# Patient Record
Sex: Female | Born: 1955
Health system: Southern US, Community
[De-identification: ages and names within clinical notes are randomized; demographics above are authoritative.]

## PROBLEM LIST (undated history)

## (undated) DIAGNOSIS — I10 Essential (primary) hypertension: Secondary | ICD-10-CM

## (undated) DIAGNOSIS — I639 Cerebral infarction, unspecified: Secondary | ICD-10-CM

## (undated) DIAGNOSIS — I729 Aneurysm of unspecified site: Secondary | ICD-10-CM

## (undated) DIAGNOSIS — S065X9A Traumatic subdural hemorrhage with loss of consciousness of unspecified duration, initial encounter: Secondary | ICD-10-CM

## (undated) DIAGNOSIS — R569 Unspecified convulsions: Secondary | ICD-10-CM

## (undated) DIAGNOSIS — S065XAA Traumatic subdural hemorrhage with loss of consciousness status unknown, initial encounter: Secondary | ICD-10-CM

## (undated) DIAGNOSIS — Z931 Gastrostomy status: Secondary | ICD-10-CM

## (undated) DIAGNOSIS — Z930 Tracheostomy status: Secondary | ICD-10-CM

## (undated) DIAGNOSIS — J9600 Acute respiratory failure, unspecified whether with hypoxia or hypercapnia: Secondary | ICD-10-CM

## (undated) DIAGNOSIS — Z970 Presence of artificial eye: Secondary | ICD-10-CM

## (undated) HISTORY — DX: Presence of artificial eye: Z97.0

## (undated) MED FILL — Fulvestrant Inj Soln Pref Syr 250 MG/5ML: INTRAMUSCULAR | Qty: 10 | Status: AC

---

## 2020-01-30 ENCOUNTER — Other Ambulatory Visit: Payer: Self-pay

## 2020-01-30 ENCOUNTER — Inpatient Hospital Stay (HOSPITAL_COMMUNITY): Payer: Self-pay | Admitting: Anesthesiology

## 2020-01-30 ENCOUNTER — Emergency Department (HOSPITAL_COMMUNITY): Payer: Self-pay

## 2020-01-30 ENCOUNTER — Inpatient Hospital Stay (HOSPITAL_COMMUNITY)
Admission: EM | Admit: 2020-01-30 | Discharge: 2020-04-22 | DRG: 003 | Disposition: A | Discharge status: Rehabilitation Facility | Payer: Self-pay | Attending: Neurosurgery | Admitting: Neurosurgery

## 2020-01-30 ENCOUNTER — Inpatient Hospital Stay (HOSPITAL_COMMUNITY)
Admission: EM | Disposition: A | Discharge status: Rehabilitation Facility | Payer: Self-pay | Source: Home / Self Care | Attending: Neurosurgery

## 2020-01-30 ENCOUNTER — Inpatient Hospital Stay (HOSPITAL_COMMUNITY): Payer: Self-pay

## 2020-01-30 ENCOUNTER — Encounter (HOSPITAL_COMMUNITY)
Admission: EM | Disposition: A | Discharge status: Rehabilitation Facility | Payer: Self-pay | Source: Home / Self Care | Attending: Neurosurgery

## 2020-01-30 ENCOUNTER — Other Ambulatory Visit (HOSPITAL_COMMUNITY): Payer: Self-pay

## 2020-01-30 ENCOUNTER — Encounter (HOSPITAL_COMMUNITY): Payer: Self-pay | Admitting: Emergency Medicine

## 2020-01-30 DIAGNOSIS — N390 Urinary tract infection, site not specified: Secondary | ICD-10-CM | POA: Diagnosis not present

## 2020-01-30 DIAGNOSIS — R061 Stridor: Secondary | ICD-10-CM

## 2020-01-30 DIAGNOSIS — G8194 Hemiplegia, unspecified affecting left nondominant side: Secondary | ICD-10-CM | POA: Diagnosis present

## 2020-01-30 DIAGNOSIS — Z23 Encounter for immunization: Secondary | ICD-10-CM

## 2020-01-30 DIAGNOSIS — I959 Hypotension, unspecified: Secondary | ICD-10-CM | POA: Diagnosis not present

## 2020-01-30 DIAGNOSIS — I609 Nontraumatic subarachnoid hemorrhage, unspecified: Secondary | ICD-10-CM

## 2020-01-30 DIAGNOSIS — G529 Cranial nerve disorder, unspecified: Secondary | ICD-10-CM | POA: Diagnosis not present

## 2020-01-30 DIAGNOSIS — R001 Bradycardia, unspecified: Secondary | ICD-10-CM | POA: Diagnosis not present

## 2020-01-30 DIAGNOSIS — E1165 Type 2 diabetes mellitus with hyperglycemia: Secondary | ICD-10-CM | POA: Diagnosis not present

## 2020-01-30 DIAGNOSIS — G4700 Insomnia, unspecified: Secondary | ICD-10-CM | POA: Diagnosis not present

## 2020-01-30 DIAGNOSIS — E870 Hyperosmolality and hypernatremia: Secondary | ICD-10-CM | POA: Diagnosis not present

## 2020-01-30 DIAGNOSIS — R7303 Prediabetes: Secondary | ICD-10-CM | POA: Diagnosis present

## 2020-01-30 DIAGNOSIS — I10 Essential (primary) hypertension: Secondary | ICD-10-CM | POA: Diagnosis present

## 2020-01-30 DIAGNOSIS — Z6823 Body mass index (BMI) 23.0-23.9, adult: Secondary | ICD-10-CM

## 2020-01-30 DIAGNOSIS — J398 Other specified diseases of upper respiratory tract: Secondary | ICD-10-CM | POA: Diagnosis present

## 2020-01-30 DIAGNOSIS — E878 Other disorders of electrolyte and fluid balance, not elsewhere classified: Secondary | ICD-10-CM | POA: Diagnosis not present

## 2020-01-30 DIAGNOSIS — R1310 Dysphagia, unspecified: Secondary | ICD-10-CM | POA: Diagnosis present

## 2020-01-30 DIAGNOSIS — K567 Ileus, unspecified: Secondary | ICD-10-CM

## 2020-01-30 DIAGNOSIS — R0682 Tachypnea, not elsewhere classified: Secondary | ICD-10-CM

## 2020-01-30 DIAGNOSIS — G96198 Other disorders of meninges, not elsewhere classified: Secondary | ICD-10-CM | POA: Diagnosis not present

## 2020-01-30 DIAGNOSIS — L8990 Pressure ulcer of unspecified site, unspecified stage: Secondary | ICD-10-CM | POA: Diagnosis not present

## 2020-01-30 DIAGNOSIS — R569 Unspecified convulsions: Secondary | ICD-10-CM

## 2020-01-30 DIAGNOSIS — R222 Localized swelling, mass and lump, trunk: Secondary | ICD-10-CM | POA: Diagnosis present

## 2020-01-30 DIAGNOSIS — Z930 Tracheostomy status: Secondary | ICD-10-CM

## 2020-01-30 DIAGNOSIS — J9690 Respiratory failure, unspecified, unspecified whether with hypoxia or hypercapnia: Secondary | ICD-10-CM

## 2020-01-30 DIAGNOSIS — E079 Disorder of thyroid, unspecified: Secondary | ICD-10-CM

## 2020-01-30 DIAGNOSIS — I69891 Dysphagia following other cerebrovascular disease: Secondary | ICD-10-CM

## 2020-01-30 DIAGNOSIS — E049 Nontoxic goiter, unspecified: Secondary | ICD-10-CM

## 2020-01-30 DIAGNOSIS — J9811 Atelectasis: Secondary | ICD-10-CM | POA: Diagnosis not present

## 2020-01-30 DIAGNOSIS — I729 Aneurysm of unspecified site: Secondary | ICD-10-CM

## 2020-01-30 DIAGNOSIS — Z4659 Encounter for fitting and adjustment of other gastrointestinal appliance and device: Secondary | ICD-10-CM

## 2020-01-30 DIAGNOSIS — J9601 Acute respiratory failure with hypoxia: Secondary | ICD-10-CM | POA: Diagnosis present

## 2020-01-30 DIAGNOSIS — E8809 Other disorders of plasma-protein metabolism, not elsewhere classified: Secondary | ICD-10-CM | POA: Diagnosis not present

## 2020-01-30 DIAGNOSIS — Z20822 Contact with and (suspected) exposure to covid-19: Secondary | ICD-10-CM | POA: Diagnosis present

## 2020-01-30 DIAGNOSIS — D638 Anemia in other chronic diseases classified elsewhere: Secondary | ICD-10-CM | POA: Diagnosis present

## 2020-01-30 DIAGNOSIS — G629 Polyneuropathy, unspecified: Secondary | ICD-10-CM | POA: Diagnosis not present

## 2020-01-30 DIAGNOSIS — I607 Nontraumatic subarachnoid hemorrhage from unspecified intracranial artery: Secondary | ICD-10-CM | POA: Diagnosis present

## 2020-01-30 DIAGNOSIS — R54 Age-related physical debility: Secondary | ICD-10-CM | POA: Diagnosis present

## 2020-01-30 DIAGNOSIS — K5900 Constipation, unspecified: Secondary | ICD-10-CM | POA: Diagnosis not present

## 2020-01-30 DIAGNOSIS — Z9911 Dependence on respirator [ventilator] status: Secondary | ICD-10-CM

## 2020-01-30 DIAGNOSIS — R739 Hyperglycemia, unspecified: Secondary | ICD-10-CM

## 2020-01-30 DIAGNOSIS — I161 Hypertensive emergency: Secondary | ICD-10-CM | POA: Diagnosis present

## 2020-01-30 DIAGNOSIS — E876 Hypokalemia: Secondary | ICD-10-CM | POA: Diagnosis present

## 2020-01-30 DIAGNOSIS — R140 Abdominal distension (gaseous): Secondary | ICD-10-CM

## 2020-01-30 DIAGNOSIS — R0600 Dyspnea, unspecified: Secondary | ICD-10-CM

## 2020-01-30 DIAGNOSIS — G911 Obstructive hydrocephalus: Secondary | ICD-10-CM

## 2020-01-30 DIAGNOSIS — J9600 Acute respiratory failure, unspecified whether with hypoxia or hypercapnia: Secondary | ICD-10-CM

## 2020-01-30 DIAGNOSIS — I615 Nontraumatic intracerebral hemorrhage, intraventricular: Secondary | ICD-10-CM | POA: Diagnosis present

## 2020-01-30 DIAGNOSIS — D72829 Elevated white blood cell count, unspecified: Secondary | ICD-10-CM

## 2020-01-30 DIAGNOSIS — Z9089 Acquired absence of other organs: Secondary | ICD-10-CM | POA: Diagnosis not present

## 2020-01-30 DIAGNOSIS — I6052 Nontraumatic subarachnoid hemorrhage from left vertebral artery: Principal | ICD-10-CM | POA: Diagnosis present

## 2020-01-30 DIAGNOSIS — G9349 Other encephalopathy: Secondary | ICD-10-CM | POA: Diagnosis present

## 2020-01-30 DIAGNOSIS — Z751 Person awaiting admission to adequate facility elsewhere: Secondary | ICD-10-CM

## 2020-01-30 DIAGNOSIS — Z931 Gastrostomy status: Secondary | ICD-10-CM

## 2020-01-30 DIAGNOSIS — G9341 Metabolic encephalopathy: Secondary | ICD-10-CM | POA: Diagnosis present

## 2020-01-30 DIAGNOSIS — Z781 Physical restraint status: Secondary | ICD-10-CM

## 2020-01-30 DIAGNOSIS — L89892 Pressure ulcer of other site, stage 2: Secondary | ICD-10-CM | POA: Diagnosis not present

## 2020-01-30 DIAGNOSIS — J9501 Hemorrhage from tracheostomy stoma: Secondary | ICD-10-CM | POA: Diagnosis not present

## 2020-01-30 DIAGNOSIS — D509 Iron deficiency anemia, unspecified: Secondary | ICD-10-CM | POA: Diagnosis not present

## 2020-01-30 DIAGNOSIS — I6203 Nontraumatic chronic subdural hemorrhage: Secondary | ICD-10-CM | POA: Diagnosis present

## 2020-01-30 DIAGNOSIS — D181 Lymphangioma, any site: Secondary | ICD-10-CM | POA: Diagnosis present

## 2020-01-30 DIAGNOSIS — Z978 Presence of other specified devices: Secondary | ICD-10-CM

## 2020-01-30 DIAGNOSIS — G40909 Epilepsy, unspecified, not intractable, without status epilepticus: Secondary | ICD-10-CM | POA: Diagnosis not present

## 2020-01-30 DIAGNOSIS — G910 Communicating hydrocephalus: Secondary | ICD-10-CM | POA: Diagnosis not present

## 2020-01-30 DIAGNOSIS — E860 Dehydration: Secondary | ICD-10-CM | POA: Diagnosis present

## 2020-01-30 DIAGNOSIS — Z789 Other specified health status: Secondary | ICD-10-CM

## 2020-01-30 DIAGNOSIS — E890 Postprocedural hypothyroidism: Secondary | ICD-10-CM | POA: Diagnosis not present

## 2020-01-30 DIAGNOSIS — M549 Dorsalgia, unspecified: Secondary | ICD-10-CM | POA: Diagnosis not present

## 2020-01-30 DIAGNOSIS — E042 Nontoxic multinodular goiter: Secondary | ICD-10-CM | POA: Diagnosis present

## 2020-01-30 DIAGNOSIS — T17908A Unspecified foreign body in respiratory tract, part unspecified causing other injury, initial encounter: Secondary | ICD-10-CM

## 2020-01-30 DIAGNOSIS — D6489 Other specified anemias: Secondary | ICD-10-CM | POA: Diagnosis not present

## 2020-01-30 DIAGNOSIS — B9561 Methicillin susceptible Staphylococcus aureus infection as the cause of diseases classified elsewhere: Secondary | ICD-10-CM | POA: Diagnosis not present

## 2020-01-30 DIAGNOSIS — E440 Moderate protein-calorie malnutrition: Secondary | ICD-10-CM | POA: Diagnosis not present

## 2020-01-30 DIAGNOSIS — J209 Acute bronchitis, unspecified: Secondary | ICD-10-CM | POA: Diagnosis not present

## 2020-01-30 HISTORY — PX: IR ANGIO INTRA EXTRACRAN SEL INTERNAL CAROTID BILAT MOD SED: IMG5363

## 2020-01-30 HISTORY — PX: RADIOLOGY WITH ANESTHESIA: SHX6223

## 2020-01-30 HISTORY — PX: CRANIOTOMY: SHX93

## 2020-01-30 HISTORY — PX: IR ANGIO VERTEBRAL SEL VERTEBRAL UNI L MOD SED: IMG5367

## 2020-01-30 LAB — CBC WITH DIFFERENTIAL/PLATELET
Abs Immature Granulocytes: 0.04 10*3/uL (ref 0.00–0.07)
Basophils Absolute: 0 10*3/uL (ref 0.0–0.1)
Basophils Relative: 0 %
Eosinophils Absolute: 0.1 10*3/uL (ref 0.0–0.5)
Eosinophils Relative: 1 %
HCT: 43.8 % (ref 36.0–46.0)
Hemoglobin: 13.5 g/dL (ref 12.0–15.0)
Immature Granulocytes: 0 %
Lymphocytes Relative: 19 %
Lymphs Abs: 1.9 10*3/uL (ref 0.7–4.0)
MCH: 25.4 pg — ABNORMAL LOW (ref 26.0–34.0)
MCHC: 30.8 g/dL (ref 30.0–36.0)
MCV: 82.5 fL (ref 80.0–100.0)
Monocytes Absolute: 0.4 10*3/uL (ref 0.1–1.0)
Monocytes Relative: 4 %
Neutro Abs: 7.9 10*3/uL — ABNORMAL HIGH (ref 1.7–7.7)
Neutrophils Relative %: 76 %
Platelets: 168 10*3/uL (ref 150–400)
RBC: 5.31 MIL/uL — ABNORMAL HIGH (ref 3.87–5.11)
RDW: 14.7 % (ref 11.5–15.5)
WBC: 10.3 10*3/uL (ref 4.0–10.5)
nRBC: 0 % (ref 0.0–0.2)

## 2020-01-30 LAB — RAPID URINE DRUG SCREEN, HOSP PERFORMED
Amphetamines: NOT DETECTED
Barbiturates: NOT DETECTED
Benzodiazepines: NOT DETECTED
Cocaine: NOT DETECTED
Opiates: NOT DETECTED
Tetrahydrocannabinol: NOT DETECTED

## 2020-01-30 LAB — I-STAT ARTERIAL BLOOD GAS, ED
Acid-Base Excess: 1 mmol/L (ref 0.0–2.0)
Bicarbonate: 25.8 mmol/L (ref 20.0–28.0)
Calcium, Ion: 1.24 mmol/L (ref 1.15–1.40)
HCT: 43 % (ref 36.0–46.0)
Hemoglobin: 14.6 g/dL (ref 12.0–15.0)
O2 Saturation: 99 %
Potassium: 3.8 mmol/L (ref 3.5–5.1)
Sodium: 141 mmol/L (ref 135–145)
TCO2: 27 mmol/L (ref 22–32)
pCO2 arterial: 38.7 mmHg (ref 32.0–48.0)
pH, Arterial: 7.431 (ref 7.350–7.450)
pO2, Arterial: 128 mmHg — ABNORMAL HIGH (ref 83.0–108.0)

## 2020-01-30 LAB — BASIC METABOLIC PANEL
Anion gap: 10 (ref 5–15)
BUN: 20 mg/dL (ref 8–23)
CO2: 27 mmol/L (ref 22–32)
Calcium: 9.6 mg/dL (ref 8.9–10.3)
Chloride: 104 mmol/L (ref 98–111)
Creatinine, Ser: 0.84 mg/dL (ref 0.44–1.00)
GFR, Estimated: >60 mL/min (ref >60)
Glucose, Bld: 178 mg/dL — ABNORMAL HIGH (ref 70–99)
Potassium: 3.4 mmol/L — ABNORMAL LOW (ref 3.5–5.1)
Sodium: 141 mmol/L (ref 135–145)

## 2020-01-30 LAB — ETHANOL: Alcohol, Ethyl (B): <10 mg/dL (ref <10)

## 2020-01-30 LAB — RESPIRATORY PANEL BY RT PCR (FLU A&B, COVID)
Influenza A by PCR: NEGATIVE
Influenza B by PCR: NEGATIVE
SARS Coronavirus 2 by RT PCR: NEGATIVE

## 2020-01-30 LAB — GLUCOSE, CAPILLARY
Glucose-Capillary: 140 mg/dL — ABNORMAL HIGH (ref 70–99)
Glucose-Capillary: 109 mg/dL — ABNORMAL HIGH (ref 70–99)

## 2020-01-30 LAB — PROTIME-INR
INR: 1 (ref 0.8–1.2)
Prothrombin Time: 12.7 seconds (ref 11.4–15.2)

## 2020-01-30 LAB — HIV ANTIBODY (ROUTINE TESTING W REFLEX): HIV Screen 4th Generation wRfx: NONREACTIVE

## 2020-01-30 LAB — HEMOGLOBIN A1C
Hgb A1c MFr Bld: 6.2 % — ABNORMAL HIGH (ref 4.8–5.6)
Mean Plasma Glucose: 131.24 mg/dL

## 2020-01-30 LAB — MRSA PCR SCREENING: MRSA by PCR: NEGATIVE

## 2020-01-30 LAB — APTT: aPTT: 27 seconds (ref 24–36)

## 2020-01-30 LAB — ABO/RH: ABO/RH(D): A POS

## 2020-01-30 LAB — PREPARE RBC (CROSSMATCH)

## 2020-01-30 IMAGING — CT CT HEAD W/O CM
4 series · 16 of 47 positions shown, 18 images · non-contrast
Comparison: None.

CLINICAL DATA: New or worsening headache, sudden onset

EXAM:
CT HEAD WITHOUT CONTRAST
TECHNIQUE: Contiguous axial images were obtained from the base of the skull
through the vertex without intravenous contrast.

[Series 3: head without · axial · non-contrast · 0.46mm/px · z∈[-162,-27]mm · 7 of 37 slices shown, 9 images]
[im 5/37  brain]
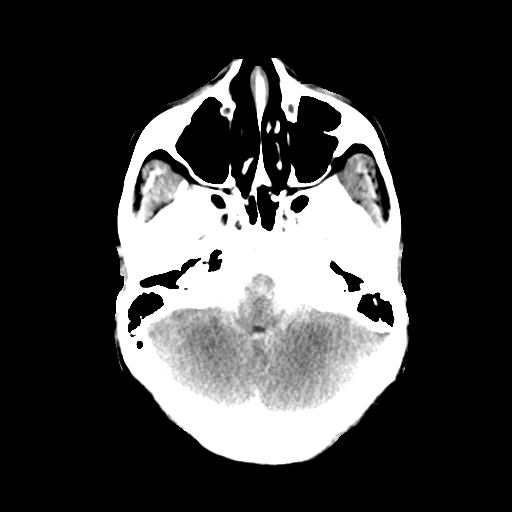
[im 5/37  bone]
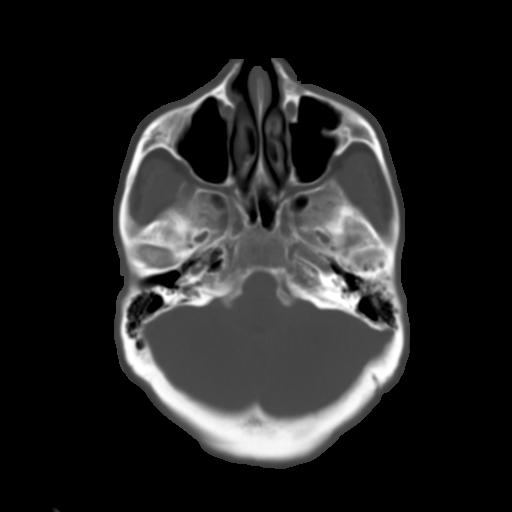
[im 10/37  brain]
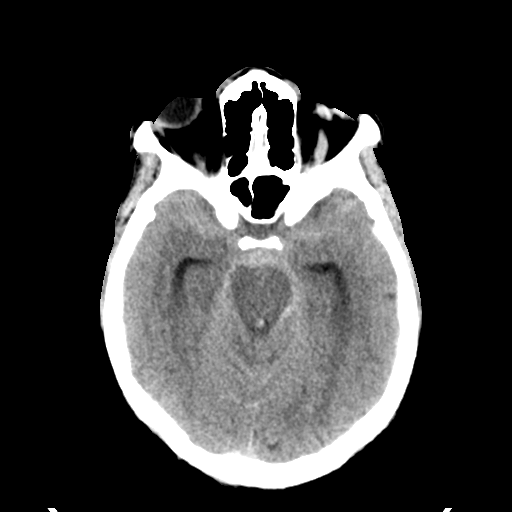
[im 14/37  brain]
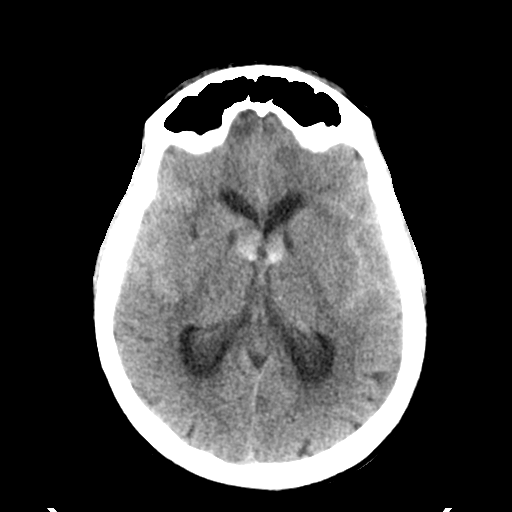
[im 19/37  brain]
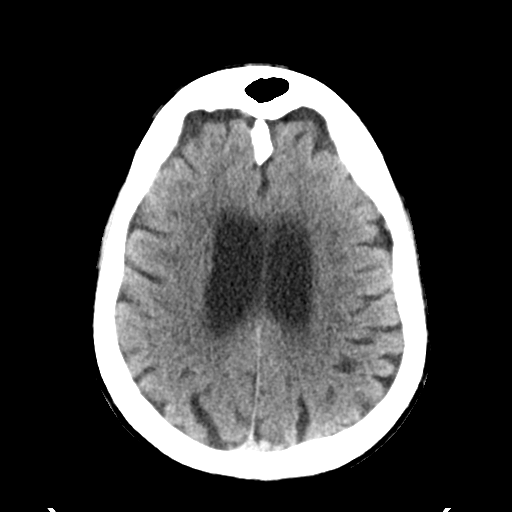
[im 23/37  brain]
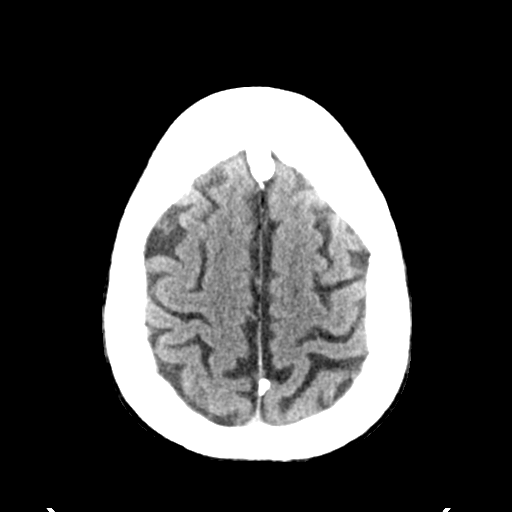
[im 23/37  bone]
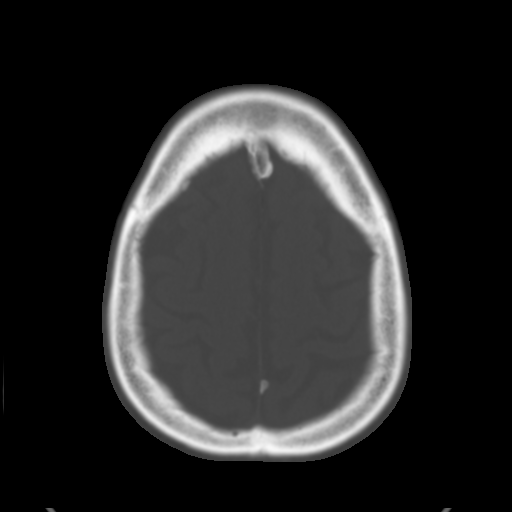
[im 28/37  brain]
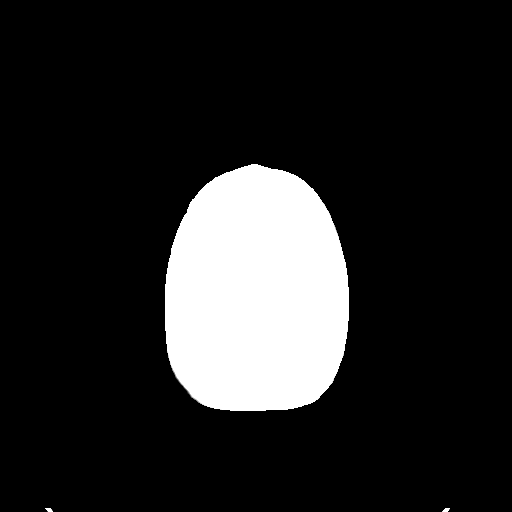
[im 32/37  brain]
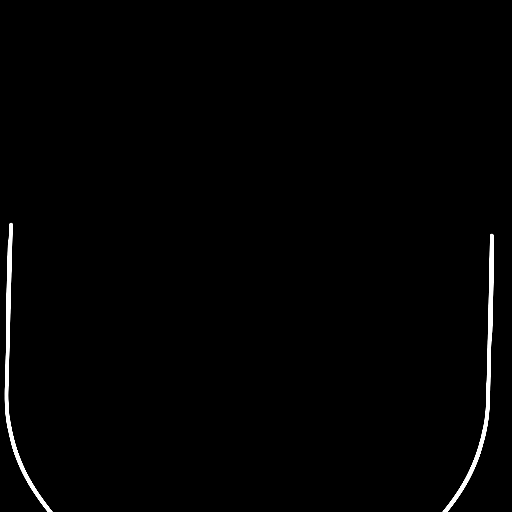

[Series 4: head bone · axial · 0.46mm/px · z∈[-164,-128]mm · 3 of 91 slices shown]
[im 10/91  bone]
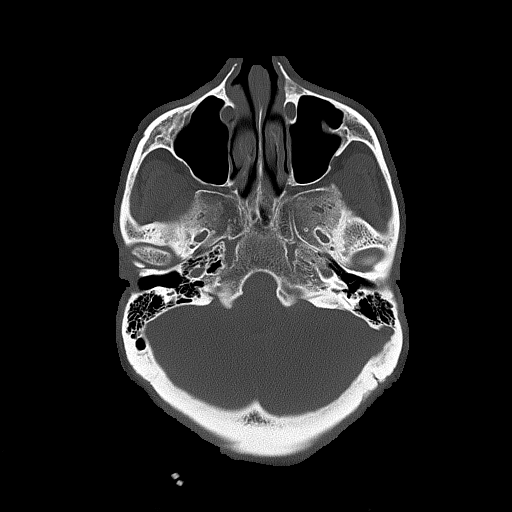
[im 19/91  bone]
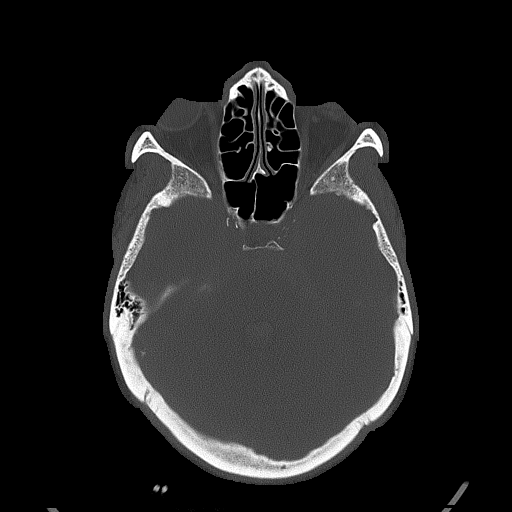
[im 28/91  bone]
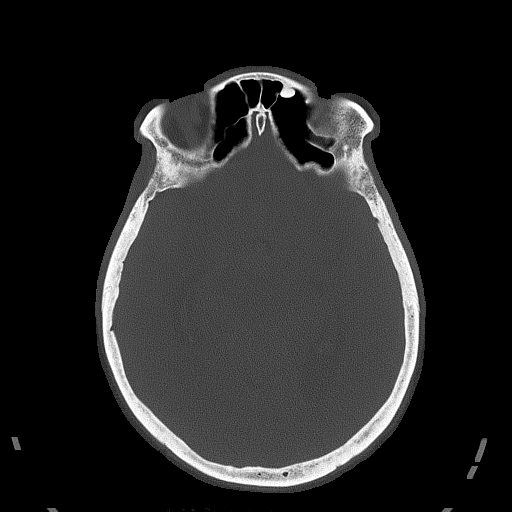

[Series 5: head without cor · coronal · non-contrast · 0.31mm/px · 3 of 71 slices shown]
[im 24/71  brain]
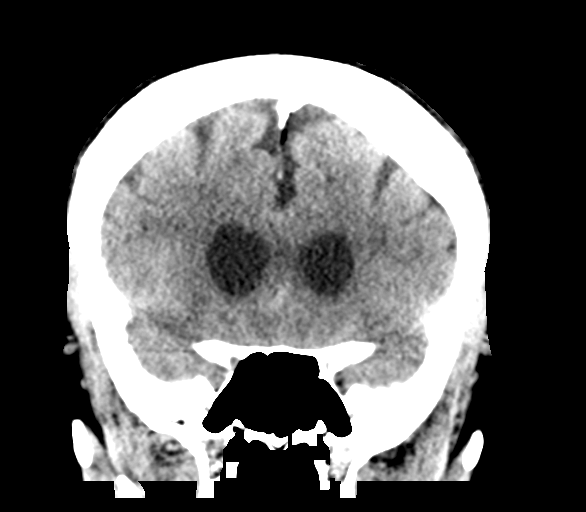
[im 32/71  brain]
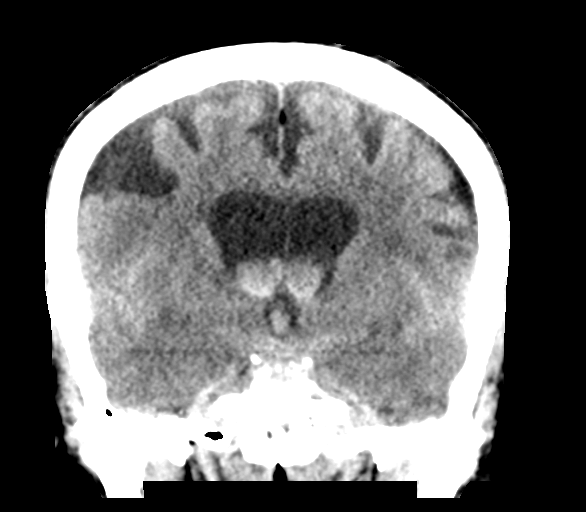
[im 39/71  brain]
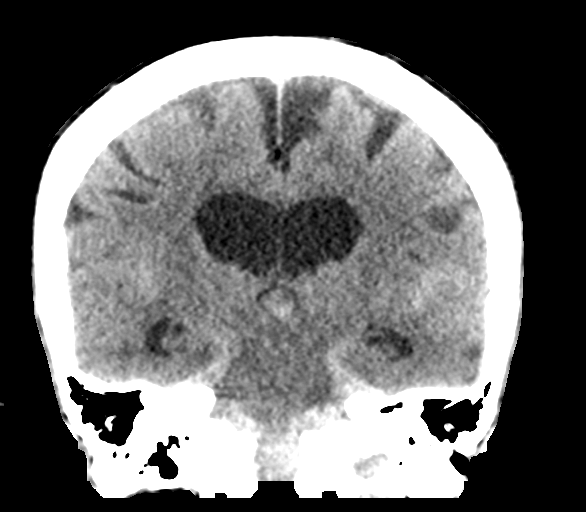

[Series 6: head without sag · sagittal · non-contrast · 0.31mm/px · 3 of 62 slices shown]
[im 21/62  brain]
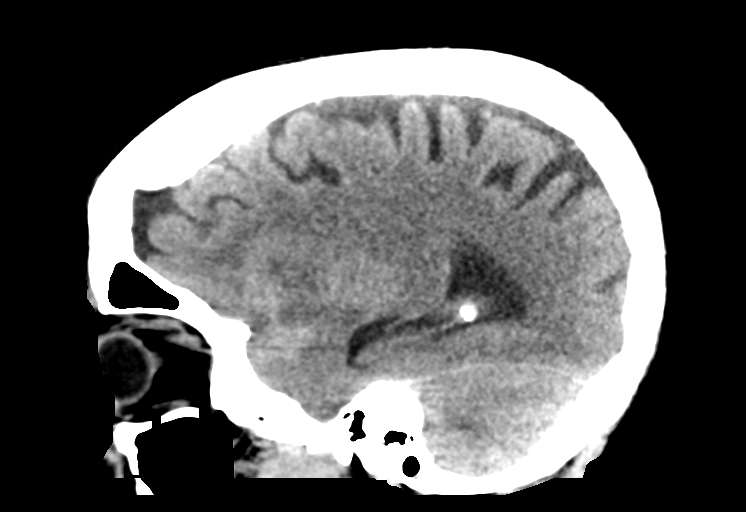
[im 31/62  brain]
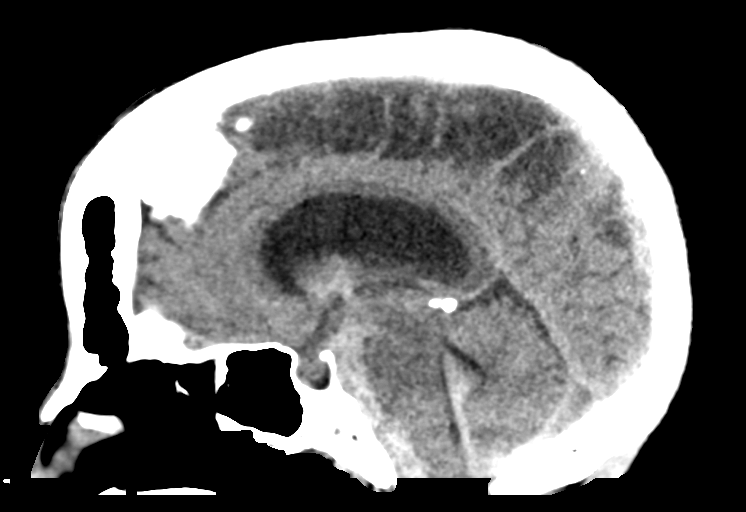
[im 41/62  brain]
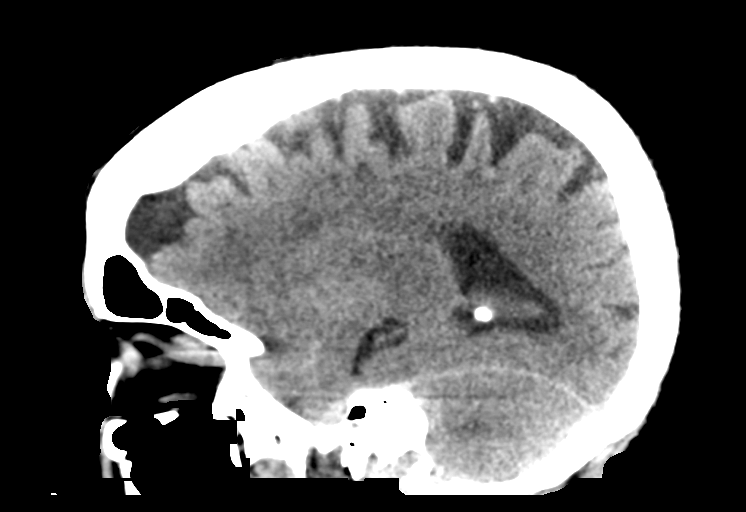

[16 of 47 positions shown; findings below may reference images not displayed]

FINDINGS: Brain: Extensive subarachnoid hemorrhage greatest in the posterior
fossa where there is encircling of the brainstem. Cranial extension
along both sylvian fissures. There is intraventricular reflux
reaching the lateral ventricles, with ventriculomegaly that is
generalized.

Low-density in the cerebral white matter including chronic
perforator infarct at the right basal ganglia. No visible cortex
infarct. There is brain atrophy.

Vascular: Negative

Skull: Negative

Sinuses/Orbits: Left enucleation with prosthesis.

Other: Critical Value/emergent results were called by telephone at
the time of interpretation on [DATE] at [DATE] to provider JHOEL
JHOEL , who verbally acknowledged these results.
IMPRESSION: 1. Large volume of subarachnoid hemorrhage related to a ruptured
left PICA aneurysm on the contemporaneous CTA.
2. Intraventricular reflux with hydrocephalus.
3. Chronic small vessel disease.

## 2020-01-30 IMAGING — DX DG CHEST 1V PORT
1 series · 1 of 1 positions shown · non-contrast
Comparison: None

CLINICAL DATA: Intubation, history of subarachnoid hemorrhage

EXAM:
PORTABLE CHEST 1 VIEW

[chest]
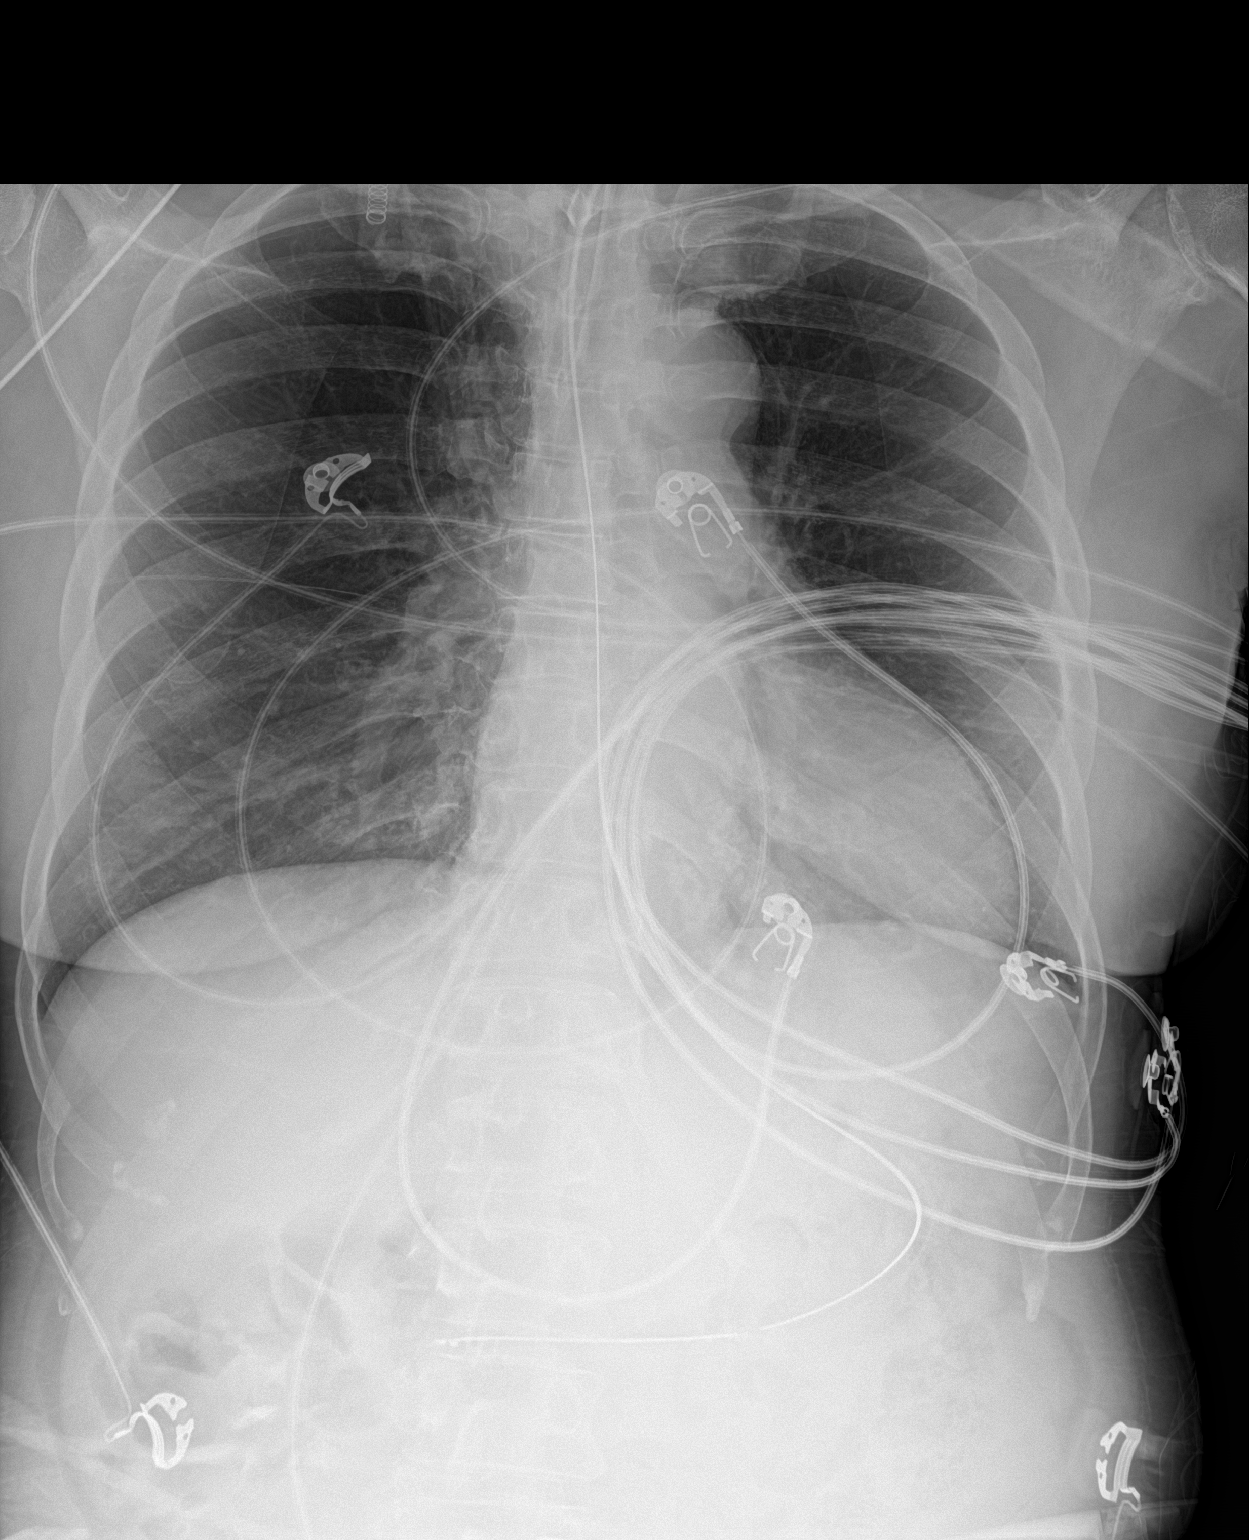

[1 of 1 positions shown; findings below may reference images not displayed]

FINDINGS: Endotracheal tube approximately 2 cm above the carina though the
carina is difficult to visualize definitively. Gastric tube in the
distal stomach.

Cardiomediastinal contours and hilar structures are normal.

Lungs are clear.

On limited assessment no acute skeletal process.
IMPRESSION: 1. Endotracheal tube approximately 2 cm above the carina though the
carina is difficult to definitively visualize. Tip projects over the
T4 vertebral body. Gastric tube in the distal stomach.
2. No acute cardiopulmonary disease.

## 2020-01-30 IMAGING — CT CT ANGIO HEAD
2 of 7 series · 8 of 33 positions shown · IV contrast (OMNI 350)
Comparison: Contemporaneous noncontrast head CT

CLINICAL DATA: Sudden onset headache.



[Series 6: cta neck · axial · 0.46mm/px · z∈[-227,-115]mm · 2 of 168 slices shown]
[im 56/168  soft-tissue]
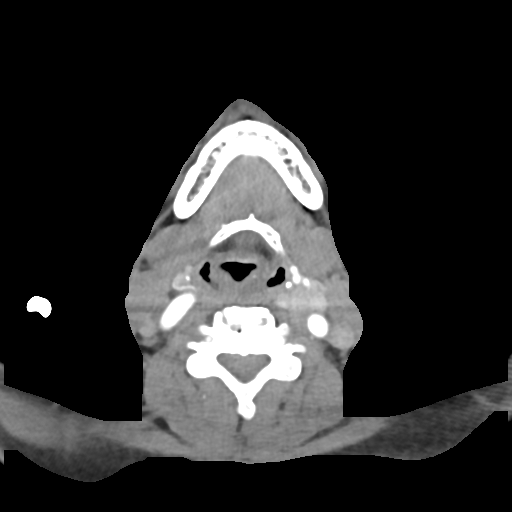
[im 112/168  soft-tissue]
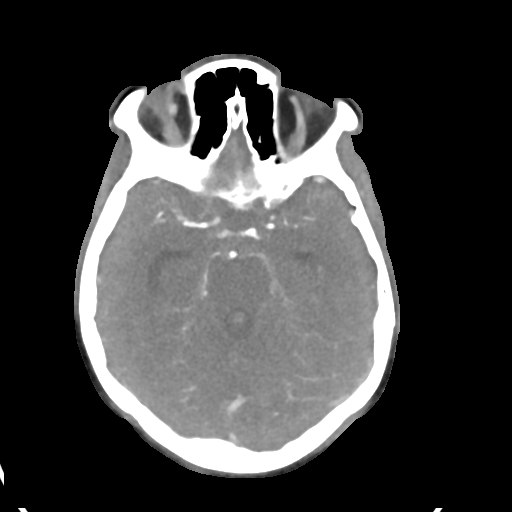

[Series 8: cta neck axial · axial · 0.39mm/px · z∈[-309,-78]mm · 6 of 329 slices shown]
[im 47/329  soft-tissue]
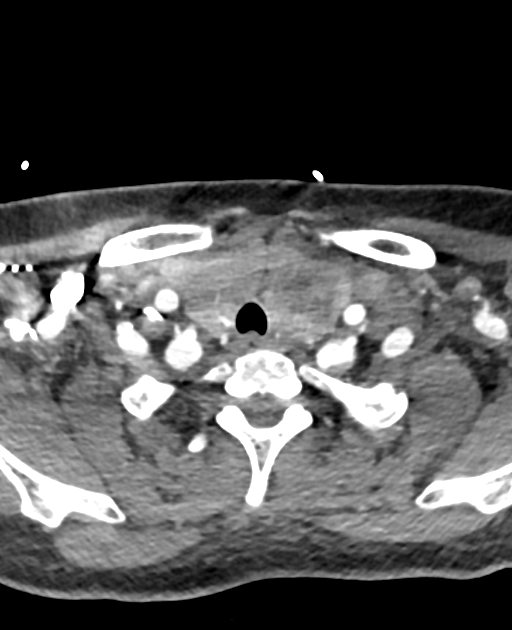
[im 94/329  bone]
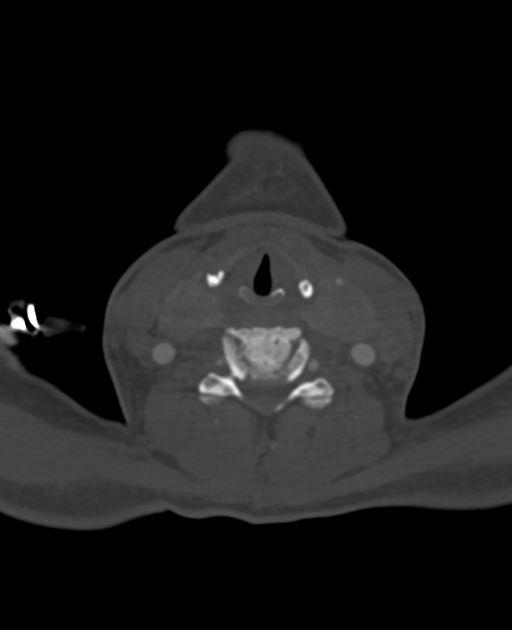
[im 141/329  soft-tissue]
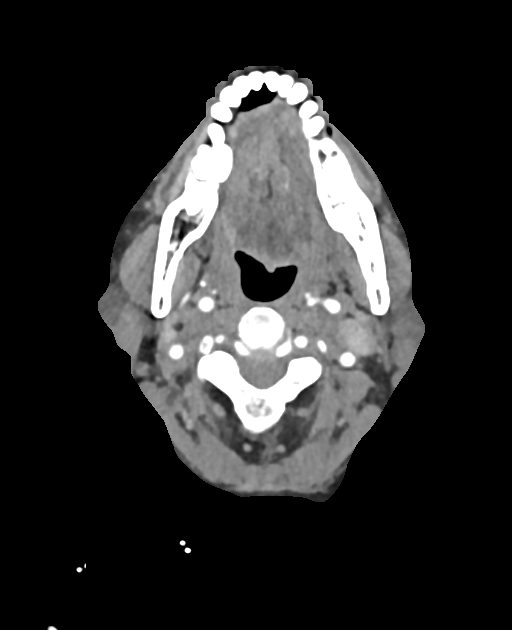
[im 188/329  bone]
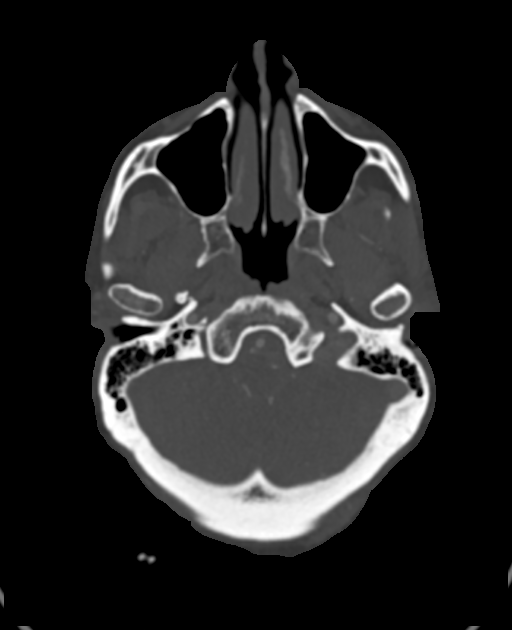
[im 235/329  soft-tissue]
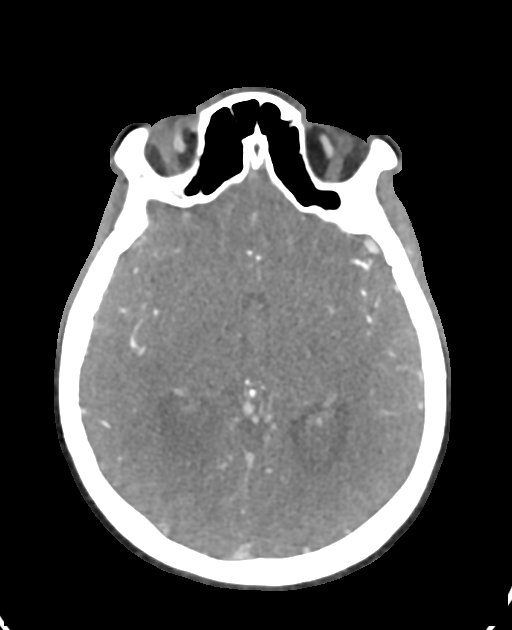
[im 282/329  bone]
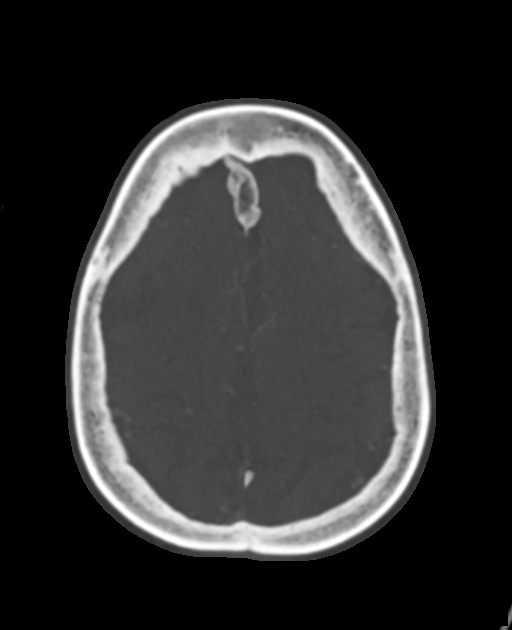

[8 of 33 positions shown; findings below may reference images not displayed]

FINDINGS: CTA NECK FINDINGS

Aortic arch: No acute finding.  Three vessel branching.

Right carotid system: Vessels are smooth and widely patent.

Left carotid system: Vessels are smooth and widely patent.

Vertebral arteries: Left vertebral artery dominance. No proximal
subclavian stenosis. The vertebral arteries are smooth and widely
patent.

Skeleton: Advanced cervical spine degeneration. Dental caries with
molar periapical erosions.

Other neck: Goiter.

Upper chest: Masslike appearance left of the upper thoracic
esophagus which could be intramural or extra serosal, 2.3 cm in
diameter on axial slices and 2.8 cm craniocaudal on coronal
reformats.

Review of the MIP images confirms the above findings

CTA HEAD FINDINGS

Anterior circulation: Atheromatous plaque along both carotid siphons
without flow reducing stenosis. No branch occlusion, generalized
beading, or aneurysm seen.

Posterior circulation: Left dominant vertebral artery. Projecting
posteriorly towards the medulla is a left PICA aneurysm which has a
mildly irregular sac shape and measures up to 3 mm base to dome.
Subjective moderate narrowing of the right V4 segment just before
the PICA origin. The basilar is diffusely patent. No PCA branch
occlusion or aneurysm.

Venous sinuses: Negative for the arterial phase. The superior
ophthalmic veins show prominent size and density, suggesting
increase in intracranial pressure.

Anatomic variants: As above

Review of the MIP images confirms the above findings
IMPRESSION: 1. Ruptured 3 mm left PICA aneurysm.
2. The left vertebral artery is dominant and diffusely patent.
Moderate right V4 segment stenosis.
3. Incidental partially covered mass in the middle mediastinum which
mildly deforms the esophagus and could be intra or extramural. After
recovery, recommend chest CT with contrast.
4. No stenosis or significant atherosclerosis in the neck.
5. Carotid siphon atherosclerosis without flow reducing stenosis.

## 2020-01-30 SURGERY — CRANIOTOMY, FOR VERTEBRAL/BASILAR ARTERY ANEURYSM REPAIR
Anesthesia: General | Laterality: Left

## 2020-01-30 SURGERY — IR WITH ANESTHESIA
Anesthesia: General

## 2020-01-30 MED ORDER — IOHEXOL 300 MG/ML  SOLN
150.0000 mL | Freq: Once | INTRAMUSCULAR | Status: AC | PRN
  Administered 2020-01-30: 60 mL via INTRA_ARTERIAL

## 2020-01-30 MED ORDER — ACETAMINOPHEN 160 MG/5ML PO SOLN
650.0000 mg | ORAL | Status: DC | PRN
  Administered 2020-01-31 – 2020-04-21 (×45): 650 mg
  Filled 2020-01-30 (×49): qty 20.3

## 2020-01-30 MED ORDER — ACETAMINOPHEN 650 MG RE SUPP
650.0000 mg | RECTAL | Status: DC | PRN
  Administered 2020-02-21: 650 mg via RECTAL
  Filled 2020-01-30: qty 1

## 2020-01-30 MED ORDER — ORAL CARE MOUTH RINSE
15.0000 mL | OROMUCOSAL | Status: DC
  Administered 2020-01-30 – 2020-03-01 (×281): 15 mL via OROMUCOSAL

## 2020-01-30 MED ORDER — POTASSIUM CHLORIDE 20 MEQ PO PACK
40.0000 meq | PACK | Freq: Once | ORAL | Status: AC
  Administered 2020-01-30: 40 meq
  Filled 2020-01-30: qty 2

## 2020-01-30 MED ORDER — SODIUM CHLORIDE 0.9% IV SOLUTION: Freq: Once | INTRAVENOUS | Status: DC

## 2020-01-30 MED ORDER — 0.9 % SODIUM CHLORIDE (POUR BTL) OPTIME
TOPICAL | Status: DC | PRN
  Administered 2020-01-30: 1000 mL

## 2020-01-30 MED ORDER — BUPIVACAINE HCL (PF) 0.5 % IJ SOLN
INTRAMUSCULAR | Status: AC
  Filled 2020-01-30: qty 30

## 2020-01-30 MED ORDER — ONDANSETRON HCL 4 MG/2ML IJ SOLN
INTRAMUSCULAR | Status: AC
  Administered 2020-01-30: 4 mg via INTRAVENOUS
  Filled 2020-01-30: qty 2

## 2020-01-30 MED ORDER — FENTANYL BOLUS VIA INFUSION
25.0000 ug | INTRAVENOUS | Status: DC | PRN
  Administered 2020-01-31: 25 ug via INTRAVENOUS
  Filled 2020-01-30: qty 25

## 2020-01-30 MED ORDER — LIDOCAINE HCL 1 % IJ SOLN
INTRAMUSCULAR | Status: AC
  Filled 2020-01-30: qty 20

## 2020-01-30 MED ORDER — INDOCYANINE GREEN 25 MG IV SOLR
25.0000 mg | Freq: Once | INTRAVENOUS | Status: DC
  Filled 2020-01-30: qty 10

## 2020-01-30 MED ORDER — THROMBIN 5000 UNITS EX SOLR
OROMUCOSAL | Status: DC | PRN
  Administered 2020-01-30 (×2): 5 mL via TOPICAL

## 2020-01-30 MED ORDER — ROCURONIUM BROMIDE 10 MG/ML (PF) SYRINGE
PREFILLED_SYRINGE | INTRAVENOUS | Status: DC | PRN
  Administered 2020-01-30 (×2): 20 mg via INTRAVENOUS
  Administered 2020-01-30: 30 mg via INTRAVENOUS
  Administered 2020-01-31: 20 mg via INTRAVENOUS

## 2020-01-30 MED ORDER — FENTANYL 2500MCG IN NS 250ML (10MCG/ML) PREMIX INFUSION
25.0000 ug/h | INTRAVENOUS | Status: DC
  Administered 2020-01-30: 25 ug/h via INTRAVENOUS
  Administered 2020-01-30: 50 ug/h via INTRAVENOUS
  Filled 2020-01-30: qty 250

## 2020-01-30 MED ORDER — MANNITOL 25 % IV SOLN
INTRAVENOUS | Status: DC | PRN
  Administered 2020-01-30: 50 g via INTRAVENOUS

## 2020-01-30 MED ORDER — PHENYLEPHRINE 40 MCG/ML (10ML) SYRINGE FOR IV PUSH (FOR BLOOD PRESSURE SUPPORT)
PREFILLED_SYRINGE | INTRAVENOUS | Status: DC | PRN
  Administered 2020-01-30: 200 ug via INTRAVENOUS
  Administered 2020-01-30: 80 ug via INTRAVENOUS
  Administered 2020-01-31: 40 ug via INTRAVENOUS

## 2020-01-30 MED ORDER — POTASSIUM CHLORIDE 20 MEQ PO PACK: 40.0000 meq | PACK | Freq: Once | ORAL | Status: DC

## 2020-01-30 MED ORDER — FENTANYL CITRATE (PF) 250 MCG/5ML IJ SOLN
INTRAMUSCULAR | Status: AC
  Filled 2020-01-30: qty 5

## 2020-01-30 MED ORDER — ETOMIDATE 2 MG/ML IV SOLN
20.0000 mg | Freq: Once | INTRAVENOUS | Status: AC
  Administered 2020-01-30: 20 mg via INTRAVENOUS

## 2020-01-30 MED ORDER — FENTANYL CITRATE (PF) 250 MCG/5ML IJ SOLN
INTRAMUSCULAR | Status: DC | PRN
  Administered 2020-01-30 – 2020-01-31 (×3): 50 ug via INTRAVENOUS

## 2020-01-30 MED ORDER — CLEVIDIPINE BUTYRATE 0.5 MG/ML IV EMUL
0.0000 mg/h | INTRAVENOUS | Status: DC
  Administered 2020-01-30: 1 mg/h via INTRAVENOUS
  Filled 2020-01-30: qty 100
  Filled 2020-01-30 (×2): qty 50

## 2020-01-30 MED ORDER — LABETALOL HCL 5 MG/ML IV SOLN
INTRAVENOUS | Status: AC
  Administered 2020-01-30: 20 mg
  Filled 2020-01-30: qty 4

## 2020-01-30 MED ORDER — PROPOFOL 1000 MG/100ML IV EMUL
INTRAVENOUS | Status: AC
  Filled 2020-01-30: qty 100

## 2020-01-30 MED ORDER — INSULIN ASPART 100 UNIT/ML ~~LOC~~ SOLN
0.0000 [IU] | SUBCUTANEOUS | Status: DC
  Administered 2020-01-31: 2 [IU] via SUBCUTANEOUS

## 2020-01-30 MED ORDER — PANTOPRAZOLE SODIUM 40 MG PO TBEC: 40.0000 mg | DELAYED_RELEASE_TABLET | Freq: Every day | ORAL | Status: DC

## 2020-01-30 MED ORDER — SODIUM CHLORIDE 0.9 % IV SOLN: INTRAVENOUS | Status: DC

## 2020-01-30 MED ORDER — LABETALOL HCL 5 MG/ML IV SOLN
20.0000 mg | Freq: Once | INTRAVENOUS | Status: AC
  Administered 2020-01-30: 20 mg via INTRAVENOUS
  Filled 2020-01-30: qty 4

## 2020-01-30 MED ORDER — BACITRACIN ZINC 500 UNIT/GM EX OINT
TOPICAL_OINTMENT | CUTANEOUS | Status: DC | PRN
  Administered 2020-01-30: 1 via TOPICAL

## 2020-01-30 MED ORDER — PROPOFOL 10 MG/ML IV BOLUS
INTRAVENOUS | Status: DC | PRN
  Administered 2020-01-30 – 2020-01-31 (×2): 20 mg via INTRAVENOUS

## 2020-01-30 MED ORDER — DOCUSATE SODIUM 100 MG PO CAPS: 100.0000 mg | ORAL_CAPSULE | Freq: Two times a day (BID) | ORAL | Status: DC

## 2020-01-30 MED ORDER — THROMBIN 20000 UNITS EX SOLR
CUTANEOUS | Status: DC | PRN
  Administered 2020-01-30: 20 mL via TOPICAL

## 2020-01-30 MED ORDER — ROCURONIUM BROMIDE 10 MG/ML (PF) SYRINGE
PREFILLED_SYRINGE | INTRAVENOUS | Status: DC | PRN
  Administered 2020-01-30: 60 mg via INTRAVENOUS
  Administered 2020-01-30: 40 mg via INTRAVENOUS

## 2020-01-30 MED ORDER — CEFAZOLIN SODIUM-DEXTROSE 2-3 GM-%(50ML) IV SOLR
INTRAVENOUS | Status: DC | PRN
  Administered 2020-01-30 – 2020-01-31 (×2): 2 g via INTRAVENOUS

## 2020-01-30 MED ORDER — CHLORHEXIDINE GLUCONATE 0.12% ORAL RINSE (MEDLINE KIT)
15.0000 mL | Freq: Two times a day (BID) | OROMUCOSAL | Status: DC
  Administered 2020-01-30 – 2020-02-29 (×58): 15 mL via OROMUCOSAL

## 2020-01-30 MED ORDER — CLEVIDIPINE BUTYRATE 0.5 MG/ML IV EMUL
0.0000 mg/h | INTRAVENOUS | Status: DC
  Administered 2020-01-30: 15 mg/h via INTRAVENOUS
  Administered 2020-01-30 (×2): 21 mg/h via INTRAVENOUS
  Administered 2020-01-30: 12 mg/h via INTRAVENOUS
  Administered 2020-01-31 (×3): 21 mg/h via INTRAVENOUS
  Administered 2020-01-31: 20 mg/h via INTRAVENOUS
  Administered 2020-01-31 – 2020-02-01 (×4): 21 mg/h via INTRAVENOUS
  Administered 2020-02-01: 16 mg/h via INTRAVENOUS
  Administered 2020-02-01: 8 mg/h via INTRAVENOUS
  Administered 2020-02-01: 16 mg/h via INTRAVENOUS
  Administered 2020-02-01 (×2): 21 mg/h via INTRAVENOUS
  Administered 2020-02-01: 18 mg/h via INTRAVENOUS
  Administered 2020-02-01: 21 mg/h via INTRAVENOUS
  Administered 2020-02-02: 18 mg/h via INTRAVENOUS
  Administered 2020-02-02 (×2): 21 mg/h via INTRAVENOUS
  Administered 2020-02-02: 18 mg/h via INTRAVENOUS
  Administered 2020-02-02: 21 mg/h via INTRAVENOUS
  Administered 2020-02-02: 16 mg/h via INTRAVENOUS
  Administered 2020-02-02: 21 mg/h via INTRAVENOUS
  Administered 2020-02-03: 10 mg/h via INTRAVENOUS
  Administered 2020-02-03: 19 mg/h via INTRAVENOUS
  Administered 2020-02-03: 21 mg/h via INTRAVENOUS
  Administered 2020-02-04: 1 mg/h via INTRAVENOUS
  Filled 2020-01-30 (×18): qty 50
  Filled 2020-01-30: qty 100
  Filled 2020-01-30 (×6): qty 50
  Filled 2020-01-30: qty 100
  Filled 2020-01-30 (×5): qty 50
  Filled 2020-01-30: qty 100
  Filled 2020-01-30: qty 50

## 2020-01-30 MED ORDER — ONDANSETRON HCL 4 MG/2ML IJ SOLN: 4.0000 mg | Freq: Once | INTRAMUSCULAR | Status: AC

## 2020-01-30 MED ORDER — HYDROMORPHONE HCL 1 MG/ML IJ SOLN: 0.5000 mg | INTRAMUSCULAR | Status: DC | PRN

## 2020-01-30 MED ORDER — STROKE: EARLY STAGES OF RECOVERY BOOK
Freq: Once | Status: AC
  Filled 2020-01-30: qty 1

## 2020-01-30 MED ORDER — ACETAMINOPHEN 325 MG PO TABS: 650.0000 mg | ORAL_TABLET | ORAL | Status: DC | PRN

## 2020-01-30 MED ORDER — LEVETIRACETAM IN NACL 500 MG/100ML IV SOLN
500.0000 mg | Freq: Two times a day (BID) | INTRAVENOUS | Status: DC
  Administered 2020-01-30 – 2020-02-05 (×13): 500 mg via INTRAVENOUS
  Filled 2020-01-30 (×13): qty 100

## 2020-01-30 MED ORDER — IOHEXOL 300 MG/ML  SOLN
150.0000 mL | Freq: Once | INTRAMUSCULAR | Status: AC | PRN
  Administered 2020-01-30: 21 mL via INTRA_ARTERIAL

## 2020-01-30 MED ORDER — PROPOFOL 1000 MG/100ML IV EMUL
5.0000 ug/kg/min | INTRAVENOUS | Status: DC
  Administered 2020-01-30: 60 ug/kg/min via INTRAVENOUS
  Administered 2020-01-31: 50 ug/kg/min via INTRAVENOUS
  Administered 2020-01-31: 15 ug/kg/min via INTRAVENOUS
  Administered 2020-01-31 – 2020-02-01 (×2): 20 ug/kg/min via INTRAVENOUS
  Administered 2020-02-01 – 2020-02-02 (×2): 10 ug/kg/min via INTRAVENOUS
  Filled 2020-01-30 (×5): qty 100
  Filled 2020-01-30: qty 200

## 2020-01-30 MED ORDER — THROMBIN 20000 UNITS EX SOLR
CUTANEOUS | Status: AC
  Filled 2020-01-30: qty 20000

## 2020-01-30 MED ORDER — BUPIVACAINE HCL 0.5 % IJ SOLN
INTRAMUSCULAR | Status: DC | PRN
  Administered 2020-01-30: 20 mL

## 2020-01-30 MED ORDER — THROMBIN 5000 UNITS EX SOLR
CUTANEOUS | Status: AC
  Filled 2020-01-30: qty 10000

## 2020-01-30 MED ORDER — FENTANYL CITRATE (PF) 100 MCG/2ML IJ SOLN
75.0000 ug | Freq: Once | INTRAMUSCULAR | Status: AC
  Administered 2020-01-30: 75 ug via INTRAVENOUS
  Filled 2020-01-30: qty 2

## 2020-01-30 MED ORDER — NIMODIPINE 6 MG/ML PO SOLN
60.0000 mg | ORAL | Status: AC
  Administered 2020-01-30 – 2020-02-16 (×95): 60 mg
  Filled 2020-01-30 (×121): qty 10

## 2020-01-30 MED ORDER — DEXAMETHASONE SODIUM PHOSPHATE 10 MG/ML IJ SOLN
INTRAMUSCULAR | Status: DC | PRN
  Administered 2020-01-30: 10 mg via INTRAVENOUS

## 2020-01-30 MED ORDER — ONDANSETRON HCL 4 MG/2ML IJ SOLN
4.0000 mg | Freq: Four times a day (QID) | INTRAMUSCULAR | Status: DC | PRN
  Administered 2020-02-17: 4 mg via INTRAVENOUS
  Filled 2020-01-30 (×2): qty 2

## 2020-01-30 MED ORDER — IOHEXOL 300 MG/ML  SOLN
50.0000 mL | Freq: Once | INTRAMUSCULAR | Status: AC | PRN
  Administered 2020-01-30: 6 mL via INTRA_ARTERIAL

## 2020-01-30 MED ORDER — PROPOFOL 10 MG/ML IV BOLUS
INTRAVENOUS | Status: AC
  Administered 2020-01-30: 10 ug/kg/min via INTRAVENOUS
  Filled 2020-01-30: qty 20

## 2020-01-30 MED ORDER — CLEVIDIPINE 50 MG/100ML IV EMUL: 0.0000 mg/h | INTRAVENOUS | Status: DC

## 2020-01-30 MED ORDER — CHLORHEXIDINE GLUCONATE CLOTH 2 % EX PADS
6.0000 | MEDICATED_PAD | Freq: Every day | CUTANEOUS | Status: DC
  Administered 2020-02-02 – 2020-02-29 (×29): 6 via TOPICAL

## 2020-01-30 MED ORDER — PROPOFOL 1000 MG/100ML IV EMUL: 5.0000 ug/kg/min | INTRAVENOUS | Status: DC

## 2020-01-30 MED ORDER — NIMODIPINE 30 MG PO CAPS
60.0000 mg | ORAL_CAPSULE | ORAL | Status: AC
  Administered 2020-02-15 – 2020-02-16 (×4): 60 mg via ORAL
  Filled 2020-01-30 (×7): qty 2

## 2020-01-30 MED ORDER — PANTOPRAZOLE SODIUM 40 MG PO PACK
40.0000 mg | PACK | Freq: Every day | ORAL | Status: DC
  Administered 2020-01-30 – 2020-04-22 (×75): 40 mg
  Filled 2020-01-30 (×78): qty 20

## 2020-01-30 MED ORDER — PHENYLEPHRINE HCL-NACL 10-0.9 MG/250ML-% IV SOLN
INTRAVENOUS | Status: DC | PRN
  Administered 2020-01-30: 20 ug/min via INTRAVENOUS

## 2020-01-30 MED ORDER — ACETAMINOPHEN-CODEINE #3 300-30 MG PO TABS: 1.0000 | ORAL_TABLET | ORAL | Status: DC | PRN

## 2020-01-30 MED ORDER — SUCCINYLCHOLINE CHLORIDE 20 MG/ML IJ SOLN
80.0000 mg | Freq: Once | INTRAMUSCULAR | Status: AC
  Administered 2020-01-30: 80 mg via INTRAVENOUS

## 2020-01-30 MED ORDER — FENTANYL CITRATE (PF) 100 MCG/2ML IJ SOLN: 50.0000 ug | Freq: Once | INTRAMUSCULAR | Status: DC

## 2020-01-30 MED ORDER — NALOXONE HCL 0.4 MG/ML IJ SOLN
0.1000 mg | Freq: Once | INTRAMUSCULAR | Status: AC
  Administered 2020-01-30: 0.1 mg via INTRAVENOUS
  Filled 2020-01-30: qty 1

## 2020-01-30 MED ORDER — IOHEXOL 350 MG/ML SOLN
80.0000 mL | Freq: Once | INTRAVENOUS | Status: AC | PRN
  Administered 2020-01-30: 80 mL via INTRAVENOUS

## 2020-01-30 MED ORDER — DOCUSATE SODIUM 50 MG/5ML PO LIQD
100.0000 mg | Freq: Two times a day (BID) | ORAL | Status: DC
  Administered 2020-01-30 – 2020-03-19 (×69): 100 mg
  Filled 2020-01-30 (×73): qty 10

## 2020-01-30 MED ORDER — BACITRACIN ZINC 500 UNIT/GM EX OINT
TOPICAL_OINTMENT | CUTANEOUS | Status: AC
  Filled 2020-01-30: qty 28.35

## 2020-01-30 MED ORDER — THROMBIN 5000 UNITS EX SOLR
CUTANEOUS | Status: AC
  Filled 2020-01-30: qty 5000

## 2020-01-30 MED ORDER — ONDANSETRON 4 MG PO TBDP: 4.0000 mg | ORAL_TABLET | Freq: Four times a day (QID) | ORAL | Status: DC | PRN

## 2020-01-30 SURGICAL SUPPLY — 103 items
APL SKNCLS STERI-STRIP NONHPOA (GAUZE/BANDAGES/DRESSINGS)
BAND INSRT 18 STRL LF DISP RB (MISCELLANEOUS) ×2
BAND RUBBER #18 3X1/16 STRL (MISCELLANEOUS) ×4 IMPLANT
BATTERY IQ STERILE (MISCELLANEOUS) ×2 IMPLANT
BENZOIN TINCTURE PRP APPL 2/3 (GAUZE/BANDAGES/DRESSINGS) IMPLANT
BIT DRILL WIRE PASS 1.3MM (BIT) IMPLANT
BLADE SAW GIGLI 16 STRL (MISCELLANEOUS) IMPLANT
BLADE SURG 15 STRL LF DISP TIS (BLADE) ×1 IMPLANT
BLADE SURG 15 STRL SS (BLADE) ×2
BNDG CMPR 75X41 PLY HI ABS (GAUZE/BANDAGES/DRESSINGS)
BNDG GAUZE ELAST 4 BULKY (GAUZE/BANDAGES/DRESSINGS) IMPLANT
BNDG STRETCH 4X75 STRL LF (GAUZE/BANDAGES/DRESSINGS) IMPLANT
BTRY SRG DRVR 1.5 IQ (MISCELLANEOUS) ×1
BUR ACORN 6.0 PRECISION (BURR) ×2 IMPLANT
BUR MATCHSTICK NEURO 3.0 LAGG (BURR) IMPLANT
BUR RND DIAMOND ELITE 4.0 (BURR) ×2 IMPLANT
BUR ROUND FLUTED 4 SOFT TCH (BURR) ×2 IMPLANT
BUR ROUND FLUTED 5 RND (BURR) IMPLANT
BUR SABER DIAMOND 2.5 (BURR) ×2 IMPLANT
BUR SPIRAL ROUTER 2.3 (BUR) ×2 IMPLANT
CANISTER SUCT 3000ML PPV (MISCELLANEOUS) ×2 IMPLANT
CARTRIDGE OIL MAESTRO DRILL (MISCELLANEOUS) ×1 IMPLANT
CLIP ANEURY PERM STD 8.3MM (Clip) ×2 IMPLANT
CLIP ANEURY TI PERM MINI 6.6M (Clip) ×2 IMPLANT
CLIP VESOCCLUDE MED 6/CT (CLIP) ×2 IMPLANT
COVER MAYO STAND STRL (DRAPES) IMPLANT
COVER WAND RF STERILE (DRAPES) ×2 IMPLANT
DECANTER SPIKE VIAL GLASS SM (MISCELLANEOUS) ×2 IMPLANT
DIFFUSER DRILL AIR PNEUMATIC (MISCELLANEOUS) ×2 IMPLANT
DRAPE MICROSCOPE LEICA (MISCELLANEOUS) ×2 IMPLANT
DRAPE NEUROLOGICAL W/INCISE (DRAPES) ×2 IMPLANT
DRAPE WARM FLUID 44X44 (DRAPES) ×2 IMPLANT
DRILL WIRE PASS 1.3MM (BIT)
DRSG ADAPTIC 3X8 NADH LF (GAUZE/BANDAGES/DRESSINGS) ×2 IMPLANT
DRSG OPSITE 4X5.5 SM (GAUZE/BANDAGES/DRESSINGS) ×8 IMPLANT
DRSG TELFA 3X8 NADH (GAUZE/BANDAGES/DRESSINGS) ×2 IMPLANT
DURAPREP 6ML APPLICATOR 50/CS (WOUND CARE) ×2 IMPLANT
ELECT REM PT RETURN 9FT ADLT (ELECTROSURGICAL) ×2
ELECTRODE REM PT RTRN 9FT ADLT (ELECTROSURGICAL) ×1 IMPLANT
EVACUATOR SILICONE 100CC (DRAIN) IMPLANT
FORCEPS BIPOLAR SPETZLER 8 1.0 (NEUROSURGERY SUPPLIES) IMPLANT
GAUZE 4X4 16PLY RFD (DISPOSABLE) ×2 IMPLANT
GAUZE SPONGE 4X4 12PLY STRL (GAUZE/BANDAGES/DRESSINGS) IMPLANT
GLOVE BIO SURGEON STRL SZ 6.5 (GLOVE) ×2 IMPLANT
GLOVE BIO SURGEON STRL SZ7 (GLOVE) ×2 IMPLANT
GLOVE BIO SURGEON STRL SZ7.5 (GLOVE) IMPLANT
GLOVE BIOGEL PI IND STRL 7.0 (GLOVE) ×2 IMPLANT
GLOVE BIOGEL PI IND STRL 7.5 (GLOVE) ×2 IMPLANT
GLOVE BIOGEL PI INDICATOR 7.0 (GLOVE) ×2
GLOVE BIOGEL PI INDICATOR 7.5 (GLOVE) ×2
GLOVE ECLIPSE 6.5 STRL STRAW (GLOVE) ×2 IMPLANT
GLOVE ECLIPSE 7.0 STRL STRAW (GLOVE) ×4 IMPLANT
GOWN STRL REUS W/ TWL LRG LVL3 (GOWN DISPOSABLE) ×2 IMPLANT
GOWN STRL REUS W/ TWL XL LVL3 (GOWN DISPOSABLE) IMPLANT
GOWN STRL REUS W/TWL 2XL LVL3 (GOWN DISPOSABLE) IMPLANT
GOWN STRL REUS W/TWL LRG LVL3 (GOWN DISPOSABLE) ×4
GOWN STRL REUS W/TWL XL LVL3 (GOWN DISPOSABLE)
GRAFT DURAGEN MATRIX 3WX3L (Graft) ×2 IMPLANT
GRAFT DURAGEN MATRIX 3X3 SNGL (Graft) ×1 IMPLANT
HEMOSTAT POWDER KIT SURGIFOAM (HEMOSTASIS) ×2 IMPLANT
HEMOSTAT SURGICEL 2X14 (HEMOSTASIS) ×2 IMPLANT
HOOK DURA 1/2IN (MISCELLANEOUS) ×2 IMPLANT
KIT BASIN OR (CUSTOM PROCEDURE TRAY) ×2 IMPLANT
KIT DRAIN CSF ACCUDRAIN (MISCELLANEOUS) IMPLANT
KIT TURNOVER KIT B (KITS) ×2 IMPLANT
KNIFE ARACHNOID DISP AM-24-S (MISCELLANEOUS) ×2 IMPLANT
NEEDLE HYPO 22GX1.5 SAFETY (NEEDLE) IMPLANT
NEEDLE HYPO 25X1 1.5 SAFETY (NEEDLE) ×2 IMPLANT
NEEDLE SPNL 25GX3.5 QUINCKE BL (NEEDLE) IMPLANT
NS IRRIG 1000ML POUR BTL (IV SOLUTION) ×2 IMPLANT
OIL CARTRIDGE MAESTRO DRILL (MISCELLANEOUS) ×2
PACK CRANIOTOMY CUSTOM (CUSTOM PROCEDURE TRAY) ×2 IMPLANT
PAD ARMBOARD 7.5X6 YLW CONV (MISCELLANEOUS) ×2 IMPLANT
PATTIES SURGICAL .25X.25 (GAUZE/BANDAGES/DRESSINGS) ×2 IMPLANT
PATTIES SURGICAL .5 X.5 (GAUZE/BANDAGES/DRESSINGS) IMPLANT
PATTIES SURGICAL .5 X3 (DISPOSABLE) IMPLANT
PATTIES SURGICAL 1/4 X 3 (GAUZE/BANDAGES/DRESSINGS) IMPLANT
PATTIES SURGICAL 1X1 (DISPOSABLE) IMPLANT
PERFORATOR LRG  14-11MM (BIT)
PERFORATOR LRG 14-11MM (BIT) IMPLANT
PIN MAYFIELD SKULL DISP (PIN) ×2 IMPLANT
PLATE CRANIAL 12 2H RIGID UNI (Plate) ×4 IMPLANT
PROBE FOR NEUROSURGERY (MISCELLANEOUS) ×2 IMPLANT
SCREW UNIII AXS SD 1.5X4 (Screw) ×8 IMPLANT
SEALANT ADHERUS EXTEND TIP (MISCELLANEOUS) ×2 IMPLANT
SPONGE NEURO XRAY DETECT 1X3 (DISPOSABLE) IMPLANT
SPONGE SURGIFOAM ABS GEL 100 (HEMOSTASIS) ×2 IMPLANT
STAPLER VISISTAT 35W (STAPLE) ×4 IMPLANT
STOCKINETTE 6  STRL (DRAPES) ×1
STOCKINETTE 6 STRL (DRAPES) ×1 IMPLANT
SUT ETHILON 3 0 FSL (SUTURE) IMPLANT
SUT NURALON 4 0 TR CR/8 (SUTURE) ×4 IMPLANT
SUT VIC AB 0 CT1 18XCR BRD8 (SUTURE) ×5 IMPLANT
SUT VIC AB 0 CT1 8-18 (SUTURE) ×10
SUT VIC AB 3-0 SH 8-18 (SUTURE) ×2 IMPLANT
TAPE CLOTH 1X10 TAN NS (GAUZE/BANDAGES/DRESSINGS) ×2 IMPLANT
TIP NONSTICK .5MMX23CM (INSTRUMENTS)
TIP NONSTICK .5X23 (INSTRUMENTS) IMPLANT
TOWEL GREEN STERILE (TOWEL DISPOSABLE) ×2 IMPLANT
TOWEL GREEN STERILE FF (TOWEL DISPOSABLE) ×2 IMPLANT
TRAY FOLEY MTR SLVR 16FR STAT (SET/KITS/TRAYS/PACK) ×2 IMPLANT
UNDERPAD 30X36 HEAVY ABSORB (UNDERPADS AND DIAPERS) IMPLANT
WATER STERILE IRR 1000ML POUR (IV SOLUTION) ×2 IMPLANT

## 2020-01-30 NOTE — Anesthesia Postprocedure Evaluation (Signed)
Anesthesia Post Note  Patient: Stacy Moran  Procedure(s) Performed: IR WITH ANESTHESIA (N/A )     Patient location during evaluation: Other Anesthesia Type: General Level of consciousness: sedated Pain management: pain level controlled Vital Signs Assessment: post-procedure vital signs reviewed and stable Respiratory status: patient remains intubated per anesthesia plan Cardiovascular status: stable Postop Assessment: no apparent nausea or vomiting Anesthetic complications: no Comments: Patient transferred from radiology to OR for craniotomy intubated and sedated on full monitors and stable vitals   No complications documented.  Last Vitals:  Vitals:   01/30/20 1745 01/30/20 1800  BP: 132/71 128/72  Pulse: 76 76  Resp: 16 16  Temp:    SpO2: 100% 100%    Last Pain:  Vitals:   01/30/20 1630  TempSrc: Axillary  PainSc:                  Stacy Moran

## 2020-01-30 NOTE — Progress Notes (Signed)
Belongings at bedside include: clothing, cell phone, [employer] VISA, drivers license x 2, [employer] VISA, [employer] debit card, gas perks card, mascara, ear plugs, gum, [employer] working hands lotion, CC holder with work badge (?), 2 $1 coins, 1 nickel.  NO OTHER CASH.

## 2020-01-30 NOTE — H&P (Signed)
Chief Complaint   Chief Complaint  Patient presents with  . Headache    HPI   Consult requested by: Dr Leonette Monarch, [facility] Reason for consult: nontraumatic SAH, rupture PICA  HPI: Stacy Moran is a 64 y.o. female with no known past medical history who presented to ED with worst headache of life. Headache started just prior to ED arrival. Patient is currently lethargic and unable to provide any history. She underwent work up which included CT and CTA head and was found to have large volume SAH secondary to ruptured PICA and resultant hydrocephalus. I was called for recommendations and came by immediately to evaluate the patient.   HTN upon arrival. Started on cleviprex. Patient was initially awake and following commands. She became lethargic shortly after fentanyl administration. She was given narcan with some improvement although still not overly responsive. Decision was made for intubation & transfer to neuro ICU for further care.   Patient Active Problem List   Diagnosis Date Noted  . Ruptured cerebral aneurysm ([facility]) 01/30/2020    PMH: History reviewed. No pertinent past medical history.  PSH: History reviewed. No pertinent surgical history.  (Not in a hospital admission)   SH: Social History   Tobacco Use  . Smoking status: Not on file  Substance Use Topics  . Alcohol use: Not on file  . Drug use: Not on file    MEDS: Prior to Admission medications   Not on File    ALLERGY: No Known Allergies  Social History   Tobacco Use  . Smoking status: Not on file  Substance Use Topics  . Alcohol use: Not on file     No family history on file.   ROS   ROS unable to obtain  Exam   Vitals:   01/30/20 0620 01/30/20 0625  BP: (!) 162/91 (!) 165/93  Pulse: 73 73  Resp: 14 15  Temp:    SpO2: 97% 97%   WDWN, NAD GCS 9 [id number] Resting comfortably with eyes closed Does not open eyes to pain/voice Localizes with RUE to pain ?following commands with grip  although inconsistent  Results - Imaging/Labs   Results for orders placed or performed during the hospital encounter of 01/30/20 (from the past 48 hour(s))  CBC with Differential     Status: Abnormal   Collection Time: 01/30/20  4:27 AM  Result Value Ref Range   WBC 10.3 4.0 - 10.5 K/uL   RBC 5.31 (H) 3.87 - 5.11 MIL/uL   Hemoglobin 13.5 12.0 - 15.0 g/dL   HCT 43.8 36 - 46 %   MCV 82.5 80.0 - 100.0 fL   MCH 25.4 (L) 26.0 - 34.0 pg   MCHC 30.8 30.0 - 36.0 g/dL   RDW 14.7 11.5 - 15.5 %   Platelets 168 150 - 400 K/uL   nRBC 0.0 0.0 - 0.2 %   Neutrophils Relative % 76 %   Neutro Abs 7.9 (H) 1.7 - 7.7 K/uL   Lymphocytes Relative 19 %   Lymphs Abs 1.9 0.7 - 4.0 K/uL   Monocytes Relative 4 %   Monocytes Absolute 0.4 0.1 - 1.0 K/uL   Eosinophils Relative 1 %   Eosinophils Absolute 0.1 0.0 - 0.5 K/uL   Basophils Relative 0 %   Basophils Absolute 0.0 0.0 - 0.1 K/uL   Immature Granulocytes 0 %   Abs Immature Granulocytes 0.04 0.00 - 0.07 K/uL    Comment: Performed at [facility], [address], [place], [place]  [address]  Basic metabolic panel     Status: Abnormal   Collection Time: 01/30/20  4:27 AM  Result Value Ref Range   Sodium 141 135 - 145 mmol/L   Potassium 3.4 (L) 3.5 - 5.1 mmol/L   Chloride 104 98 - 111 mmol/L   CO2 27 22 - 32 mmol/L   Glucose, Bld 178 (H) 70 - 99 mg/dL    Comment: Glucose reference range applies only to samples taken after fasting for at least 8 hours.   BUN 20 8 - 23 mg/dL   Creatinine, Ser 0.84 0.44 - 1.00 mg/dL   Calcium 9.6 8.9 - 10.3 mg/dL   GFR, Estimated >60 >60 mL/min    Comment: (NOTE) Calculated using the CKD-EPI Creatinine Equation (2021)    Anion gap 10 5 - 15    Comment: Performed at [facility] [address] [place], [place] [address]  Ethanol     Status: None   Collection Time: 01/30/20  4:46 AM  Result Value Ref Range   Alcohol, Ethyl (B) <10 <10 mg/dL    Comment: (NOTE) Lowest detectable limit for serum alcohol  is 10 mg/dL.  For medical purposes only. Performed at [facility], [place] [address] [place], [place] [address]     CT Angio Head W or [place] Contrast  Result Date: 01/30/2020 CLINICAL DATA:  Sudden onset headache. EXAM: CT ANGIOGRAPHY HEAD AND NECK TECHNIQUE: Multidetector CT imaging of the head and neck was performed using the standard protocol during bolus administration of intravenous contrast. Multiplanar CT image reconstructions and MIPs were obtained to evaluate the vascular anatomy. Carotid stenosis measurements (when applicable) are obtained utilizing NASCET criteria, using the distal internal carotid diameter as the denominator. CONTRAST:  7mL OMNIPAQUE IOHEXOL 350 MG/ML SOLN COMPARISON:  Contemporaneous noncontrast head CT FINDINGS: CTA NECK FINDINGS Aortic arch: No acute finding.  Three vessel branching. Right carotid system: Vessels are smooth and widely patent. Left carotid system: Vessels are smooth and widely patent. Vertebral arteries: Left vertebral artery dominance. No proximal subclavian stenosis. The vertebral arteries are smooth and widely patent. Skeleton: Advanced cervical spine degeneration. Dental caries with molar periapical erosions. Other neck: Goiter. Upper chest: Masslike appearance left of the upper thoracic esophagus which could be intramural or extra serosal, 2.3 cm in diameter on axial slices and 2.8 cm craniocaudal on coronal reformats. Review of the MIP images confirms the above findings CTA HEAD FINDINGS Anterior circulation: Atheromatous plaque along both carotid siphons without flow reducing stenosis. No branch occlusion, generalized beading, or aneurysm seen. Posterior circulation: Left dominant vertebral artery. Projecting posteriorly towards the medulla is a left PICA aneurysm which has a mildly irregular sac shape and measures up to 3 mm base to dome. Subjective moderate narrowing of the right V4 segment just before the PICA origin. The basilar is diffusely  patent. No PCA branch occlusion or aneurysm. Venous sinuses: Negative for the arterial phase. The superior ophthalmic veins show prominent size and density, suggesting increase in intracranial pressure. Anatomic variants: As above Review of the MIP images confirms the above findings IMPRESSION: 1. Ruptured 3 mm left PICA aneurysm. 2. The left vertebral artery is dominant and diffusely patent. Moderate right V4 segment stenosis. 3. 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 After recovery, recommend chest CT with contrast. 4. No stenosis or significant atherosclerosis in the neck. 5. 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 Electronically Signed  By: Monte Fantasia M.D.   On: 01/30/2020 05:43   CT Head Wo Contrast  Result Date: 01/30/2020 CLINICAL DATA:  New or worsening headache, sudden onset EXAM: CT HEAD WITHOUT CONTRAST TECHNIQUE: Contiguous axial images were obtained from the base of the skull through the vertex without intravenous contrast. COMPARISON:  None. FINDINGS: Brain: Extensive subarachnoid hemorrhage greatest in the posterior fossa where there is encircling of the brainstem. Cranial extension along both sylvian fissures. There is intraventricular reflux reaching the lateral ventricles, with ventriculomegaly that is generalized. 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 No visible cortex infarct. There is brain atrophy. Vascular: Negative Skull: Negative Sinuses/Orbits: Left enucleation with prosthesis. Other: Critical Value/emergent results were called by telephone at the time of interpretation on 01/30/2020 at 5:30 am to provider [facility] , who verbally acknowledged these results. IMPRESSION: 1. 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 2. Intraventricular  reflux with hydrocephalus. 3. Chronic small vessel disease. Electronically Signed   By: Monte Fantasia M.D.   On: 01/30/2020 05:32   CT Angio Neck W and/or Wo Contrast  Result Date: 01/30/2020 CLINICAL DATA:  Sudden onset headache. EXAM: CT ANGIOGRAPHY HEAD AND NECK TECHNIQUE: Multidetector CT imaging of the head and neck was performed using the standard protocol during bolus administration of intravenous contrast. Multiplanar CT image reconstructions and MIPs were obtained to evaluate the vascular anatomy. Carotid stenosis measurements (when applicable) are obtained utilizing NASCET criteria, using the distal internal carotid diameter as the denominator. CONTRAST:  38mL OMNIPAQUE IOHEXOL 350 MG/ML SOLN COMPARISON:  Contemporaneous noncontrast head CT FINDINGS: CTA NECK FINDINGS Aortic arch: No acute finding.  Three vessel branching. Right carotid system: Vessels are smooth and widely patent. Left carotid system: Vessels are smooth and widely patent. Vertebral arteries: Left vertebral artery dominance. No proximal subclavian stenosis. The vertebral arteries are smooth and widely patent. Skeleton: Advanced cervical spine degeneration. Dental caries with molar periapical erosions. Other neck: Goiter. Upper chest: Masslike appearance left of the upper thoracic esophagus which could be intramural or extra serosal, 2.3 cm in diameter on axial slices and 2.8 cm craniocaudal on coronal reformats. Review of the MIP images confirms the above findings CTA HEAD FINDINGS Anterior circulation: Atheromatous plaque along both carotid siphons without flow reducing stenosis. No branch occlusion, generalized beading, or aneurysm seen. Posterior circulation: Left dominant vertebral artery. Projecting posteriorly towards the medulla is a left PICA aneurysm which has a mildly irregular sac shape and measures up to 3 mm base to dome. Subjective moderate narrowing of the right V4 segment just before the PICA origin. The basilar is  diffusely patent. No PCA branch occlusion or aneurysm. Venous sinuses: Negative for the arterial phase. The superior ophthalmic veins show prominent size and density, suggesting increase in intracranial pressure. Anatomic variants: As above Review of the MIP images confirms the above findings IMPRESSION: 1. Ruptured 3 mm left PICA aneurysm. 2. The left vertebral artery is dominant and diffusely patent. Moderate right V4 segment stenosis. 3. 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 After recovery, recommend chest CT with contrast. 4. No stenosis or significant atherosclerosis in the neck. 5. 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 Electronically Signed   By: Monte Fantasia M.D.   On: 01/30/2020 05:43   Impression/Plan   64 y.o. 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 Upon examination, she is lethargic with no eye opening, inconsistently following command but purposeful with RUE. Lethargy thought to be secondary to fentanyl administration. Given narcan with some improvement although not significant. Suspect her resultant hydrocephalus is also playing a role in her lethargy. Patient will need to undergo diagnostic cerebral angiogram and treatment of ruptured left PICA aneurysm. Decision made for intubation and placement of EVD. - Admit to Neuro ICU - Nimotop for vasospasm prevention, keppra for seizure prophylaxis - TCDs today then M/W/F - Wilkerson, PA-C [facility]

## 2020-01-30 NOTE — Progress Notes (Signed)
Called V Costella, PA regarding patient's ventric drain output.  It had drained 50ml in less than 10 minutes with leveled at 75mmHg.  Vinnie said to raise drain to 10mmHg and continue to assess. Lewis Grivas C 8:54 AM

## 2020-01-30 NOTE — ED Notes (Signed)
RT at bedside obtaining ABG, will then transport pt to ICU. All belongings placed in bags with multiple pt labels on bags.

## 2020-01-30 NOTE — Anesthesia Preprocedure Evaluation (Addendum)
Anesthesia Evaluation  Patient identified by MRN, date of birth, ID band Patient unresponsive    Reviewed: Patient's Chart, lab work & pertinent test results, Unable to perform ROS - Chart review onlyPreop documentation limited or incomplete due to emergent nature of procedure.  History of Anesthesia Complications Negative for: history of anesthetic complications  Airway Mallampati: Intubated       Dental   Pulmonary  Intubated already for MS decline with large SAH, PICA rupture 01/30/2020 SARS coronavirus NEG   breath sounds clear to auscultation       Cardiovascular  Rhythm:Regular Rate:Tachycardia  unknown   Neuro/Psych Ruptured cerebral aneurysm: large SAH, with PICA rupture, has ventric now     GI/Hepatic unknown   Endo/Other  unknown  Renal/GU      Musculoskeletal   Abdominal   Peds  Hematology   Anesthesia Other Findings   Reproductive/Obstetrics                            Anesthesia Physical Anesthesia Plan  ASA: IV and emergent  Anesthesia Plan: General   Post-op Pain Management:    Induction: Inhalational  PONV Risk Score and Plan: 3 and Treatment may vary due to age or medical condition  Airway Management Planned: Oral ETT  Additional Equipment: Arterial line  Intra-op Plan:   Post-operative Plan: Post-operative intubation/ventilation  Informed Consent:     Only emergency history available  Plan Discussed with: CRNA and Surgeon  Anesthesia Plan Comments: (No family available, pt intubated, Dr. Kathyrn Sheriff verifies emergency)       Anesthesia Quick Evaluation

## 2020-01-30 NOTE — Procedures (Signed)
PREOP DX: Hydrocephalus  POSTOP DX: Same  PROCEDURE: Right frontal ventriculostomy   SURGEON: Dr. [name], MD  ANESTHESIA: IV Sedation (propofol and fentanyl) with Local  EBL: Minimal  SPECIMENS: None  COMPLICATIONS: None  CONDITION: Hemodynamically stable  INDICATIONS: [name] is a 64 y.o. female presenting with sudden HA and progressive obtundation. CT demonstrated diffused SAH from ruptured L PICA and obstructive HCP. EVD was therefore indicated. No family was immediately available and we therefore proceed with emergent procedure with implied consent.  PROCEDURE IN DETAIL: Skin of the right frontal scalp was clipped, prepped and draped in the usual sterile fashion.  Scalp was then infiltrated with local anesthetic with epinephrine.  Skin incision was made sharply, and twist drill burr hole was made.  The dura was then incised, and the ventricular catheter was passed in a single attempt into the right lateral ventricle.  Good CSF flow was obtained.  The catheter was then tunneled subcutaneously and connected to a drainage system and the skin incision closed.  The drain was then secured in place.  FINDINGS: 1. Opening pressure ~20cmH2O 2. Clear CSF

## 2020-01-30 NOTE — Progress Notes (Signed)
SLP Cancellation Note  Patient Details Name: Stacy Moran MRN: [id number] DOB: 1955-11-21   Cancelled treatment:       Reason Eval/Treat Not Completed: Patient not medically ready. Pt intubated with plans for EVD and possible coiling today. Will f/u for readiness to participate in SLP evaluation.    Osie Bond., M.A. [facility] Pager [phone] Office [phone]  01/30/2020, 8:43 AM

## 2020-01-30 NOTE — Anesthesia Procedure Notes (Signed)
Arterial Line Insertion Start/End11/05/2019 6:40 PM, 01/30/2020 6:45 PM Performed by: Shirlyn Goltz, CRNA  Patient location: OOR procedure area. Emergency situation Left, radial was placed Catheter size: 20 G Hand hygiene performed , maximum sterile barriers used  and Seldinger technique used Allen's test indicative of satisfactory collateral circulation Attempts: 2 Procedure performed without using ultrasound guided technique. Following insertion, Biopatch and dressing applied. Post procedure assessment: normal  Post procedure complications: local hematoma.

## 2020-01-30 NOTE — Progress Notes (Deleted)
NAME:  Stacy Moran, MRN:  [id number], DOB:  01-26-1956, LOS: 0 ADMISSION DATE:  01/30/2020, CONSULTATION DATE:  01/30/2020 REFERRING MD:  Ferne Reus, PA, CHIEF COMPLAINT:  Headache  Brief History   51 yoF presenting with sudden onset of worst headache of her life.  BP 262/123.  CT/ CTA showed large volume SAH secondary to ruptured PICA with resultant hydrocephalus. Progressive decline in mental status in ER requiring intubation for airway protection.  Admitted by NSGY.  PCCM consulted for vent management.  EVD to be placed on arrival to ICU.   History of present illness   HPI obtained from medical chart review as patient is intubated and sedated on mechanical ventilation.   64 year old female with no known prior medical history presenting with sudden onset of the worst headache of her life with associated nausea and vomiting.  Noted to be hypertensive with BP 262/123 in ER.  CT/ CTA head and neck showed large volume SAH secondary to ruptured PICA aneurysm with resultant hydrocephalus.  She was started on cleviprex and treated with fentanyl, however she had progressive decline in mental status, despite narcan, requiring intubation for airway protection.  Admitted by neurosurgery.  Plans for EVD on arrival to ICU and diagnostic angiogram with possible coiling today.  PCCM consulted for ventilator management.   Past Medical History  No known past medical history   Significant Hospital Events   11/3 Admitted  Consults:  PCCM  Procedures:  11/3 ETT >> 11/3 EVD >>  Significant Diagnostic Tests:  11/3 [facility] >> 1. 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 2. Intraventricular reflux with hydrocephalus. 3. Chronic small vessel disease.  11/3 CTA head/ neck >> 1. Ruptured 3 mm left PICA aneurysm. 2. The left vertebral artery is dominant and diffusely patent. Moderate right V4 segment stenosis. 3. 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 After recovery, recommend chest CT with contrast. 4. No stenosis or significant atherosclerosis in the neck. 5. 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  Micro Data:  11/3 SARS2/ Flu >> neg  Antimicrobials:  n/a  Interim history/subjective:  Cleviprex at 21 mg/hr, requiring labetalol prn for SBP goal < 140  Objective   Blood pressure (!) 158/83, pulse 83, temperature (!) 97.5 F (36.4 C), temperature source Oral, resp. rate 18, height 5\' 9"  (1.753 m), weight 72.6 kg, SpO2 100 %.    Vent Mode: PRVC FiO2 (%):  [40 %-100 %] 40 % Set Rate:  [16 bmp-18 bmp] 16 bmp Vt Set:  [530 mL] 530 mL PEEP:  [5 cmH20] 5 cmH20 Plateau Pressure:  [19 cmH20] 19 cmH20  No intake or output data in the 24 hours ending 01/30/20 0837 Filed Weights   01/30/20 0657  Weight: 72.6 kg    Examination: General: no acute distress HEENT: NCAT,  R pupil pinpoint and minimally reactive, L eye injury, MMMP Lungs: CTA, no wheezes, rhonchi, rales Cardiovascular: RRR, no m/g/r Abdomen: soft, NT,ND, BS+ Extremities: no clubbing, cyanosis or edema Skin: no rashes, warm and dry Neuro: shivering like movements in RUE biting on tube GU: deferred  Resolved Hospital Problem list     Assessment & Plan:  Acute sah 2/2 L PICA aneurysm rupture and hydrocephalus: -per neurosx -keppra, nimotop -tcd pending -evd being placed -pending diagnositc angio with possible coiling vs clipping HA Acute encephalopathy 2/2 above  Acute resp failure:  -  2/2 neuro status -intubated for airway protection -titrate vent  Hypertensive emergency:  -on cleviprex at this time -prn labetalol unless HR <60 -goal sbp <140  Hypokalemia:  -replace  Hyperglcyemia:  -check a1c  Mediastinal mass:  -f/u based on clinical course Best practice:  Diet: npo Pain/Anxiety/Delirium protocol (if indicated):  per protocol VAP protocol (if indicated): per protocol DVT prophylaxis: scd GI prophylaxis: ppi Glucose control: monitoring Mobility: bedrest Code Status: FULL Family Communication: per primary Disposition: ICU  Labs   CBC: Recent Labs  Lab 01/30/20 0427 01/30/20 0753  WBC 10.3  --   NEUTROABS 7.9*  --   HGB 13.5 14.6  HCT 43.8 43.0  MCV 82.5  --   PLT 168  --     Basic Metabolic Panel: Recent Labs  Lab 01/30/20 0427 01/30/20 0753  NA 141 141  K 3.4* 3.8  CL 104  --   CO2 27  --   GLUCOSE 178*  --   BUN 20  --   CREATININE 0.84  --   CALCIUM 9.6  --    GFR: Estimated Creatinine Clearance: 72.6 mL/min (by C-G formula based on SCr of 0.84 mg/dL). Recent Labs  Lab 01/30/20 0427  WBC 10.3    Liver Function Tests: No results for input(s): AST, ALT, ALKPHOS, BILITOT, PROT, ALBUMIN in the last 168 hours. No results for input(s): LIPASE, AMYLASE in the last 168 hours. No results for input(s): AMMONIA in the last 168 hours.  ABG    Component Value Date/Time   PHART 7.431 01/30/2020 0753   PCO2ART 38.7 01/30/2020 0753   PO2ART 128 (H) 01/30/2020 0753   HCO3 25.8 01/30/2020 0753   TCO2 27 01/30/2020 0753   O2SAT 99.0 01/30/2020 0753     Coagulation Profile: No results for input(s): INR, PROTIME in the last 168 hours.  Cardiac Enzymes: No results for input(s): CKTOTAL, CKMB, CKMBINDEX, TROPONINI in the last 168 hours.  HbA1C: No results found for: HGBA1C  CBG: No results for input(s): GLUCAP in the last 168 hours.  Review of Systems:   unobtainable  Past Medical History  She,  has no past medical history on file.   Surgical History   History reviewed. No pertinent surgical history.   Social History      Family History   Her family history is not on file.   Allergies No Known Allergies   Home Medications  Prior to Admission medications   Not on File     Critical care time: The patient is critically ill with multiple organ systems  failure and requires high complexity decision making for assessment and support, frequent evaluation and titration of therapies, application of advanced monitoring technologies and extensive interpretation of multiple databases.  Critical care time 39 mins. This represents my time independent of the NP's/PA's/med students/residents time taking care of the pt. This is excluding procedures.     [facility] 01/30/2020, 8:37 AM

## 2020-01-30 NOTE — ED Triage Notes (Signed)
Patient from work, had an argument with [employer].  Sudden onset of HA, worst HA she has ever had.  Patient did have some nausea and dizziness initially, which has resolved.  Patient it hypertensive with EMS.  242/p

## 2020-01-30 NOTE — Progress Notes (Signed)
PT Cancellation Note  Patient Details Name: Stacy Moran MRN: [id number] DOB: 1956-03-21   Cancelled Treatment:    Reason Eval/Treat Not Completed: Medical issues which prohibited therapy;Patient not medically ready.  Pt had a new drain placed recent, not ready yet. 01/30/2020  [name], PT [facility] [phone]  (pager) [phone]  (office)   Tessie Fass Kalel Harty 01/30/2020, 4:56 PM

## 2020-01-30 NOTE — Sedation Documentation (Signed)
SBAR called to [place], Therapist, sports.

## 2020-01-30 NOTE — Brief Op Note (Addendum)
  NEUROSURGERY BRIEF OPERATIVE  NOTE   PREOP DX: Subarachnoid Hemorrhage  POSTOP DX: Same  PROCEDURE: Diagnostic cerebral angiogram  SURGEON: Dr. [name], MD  ANESTHESIA: GETA  EBL: Minimal  SPECIMENS: None  COMPLICATIONS: None  CONDITION: Stable to OR  FINDINGS (Full report in [facility]): 1. 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 2. 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 As no family is available, we continue to proceed under emergent conditions with implied consent.  [name], MD [facility]

## 2020-01-30 NOTE — ED Provider Notes (Signed)
[facility] EMERGENCY DEPARTMENT Provider Note  CSN: [id number] Arrival date & time: 01/30/20 0413  Chief Complaint(s) Headache  HPI Stacy Moran is a 64 y.o. female   The history is provided by the patient.  Headache Pain location:  Generalized Quality: throbbing. Onset quality:  Sudden Duration: within 2-3 hours. Timing:  Constant Progression:  Unchanged Chronicity:  New Relieved by:  Nothing Worsened by:  Nothing Associated symptoms: nausea and vomiting   Associated symptoms: no fever, no focal weakness, no hearing loss, no numbness, no paresthesias and no photophobia     Past Medical History History reviewed. No pertinent past medical history. There are no problems to display for this patient.  Home Medication(s) Prior to Admission medications   Not on File                                                                                                                                    Past Surgical History History reviewed. No pertinent surgical history. Family History No family history on file.  Social History Social History   Tobacco Use  . Smoking status: Not on file  Substance Use Topics  . Alcohol use: Not on file  . Drug use: Not on file   Allergies Patient has no known allergies.  Review of Systems Review of Systems  Constitutional: Negative for fever.  HENT: Negative for hearing loss.   Eyes: Negative for photophobia.  Gastrointestinal: Positive for nausea and vomiting.  Neurological: Positive for headaches. Negative for focal weakness, numbness and paresthesias.   All other systems are reviewed and are negative for acute change except as noted in the HPI  Physical Exam Vital Signs  I have reviewed the triage vital signs BP (!) 250/114   Pulse 78   Temp (!) 97.5 F (36.4 C) (Oral)   Resp (!) 23   SpO2 96%   Physical Exam Vitals reviewed.  Constitutional:      General: She is not in acute distress.     Appearance: She is well-developed. She is not diaphoretic.  HENT:     Head: Normocephalic and atraumatic.     Nose: Nose normal.  Eyes:     General: No scleral icterus.    Conjunctiva/sclera: Conjunctivae normal.     Pupils: Pupils are equal, round, and reactive to light.     Comments: Atrophic left eye   Cardiovascular:     Rate and Rhythm: Normal rate and regular rhythm.     Heart sounds: No murmur heard.  No friction rub. No gallop.   Pulmonary:     Effort: Pulmonary effort is normal. No respiratory distress.     Breath sounds: Normal breath sounds. No stridor. No rales.  Abdominal:     General: There is no distension.     Palpations: Abdomen is soft.     Tenderness: There is no abdominal tenderness.  Musculoskeletal:  General: No tenderness.     Cervical back: Normal range of motion and neck supple.  Skin:    General: Skin is warm and dry.     Findings: No erythema or rash.  Neurological:     Mental Status: She is alert and oriented to person, place, and time.     Cranial Nerves: Cranial nerves are intact.     Sensory: Sensation is intact.     Motor: Motor function is intact.     ED Results and Treatments Labs (all labs ordered are listed, but only abnormal results are displayed) Labs Reviewed  CBC WITH DIFFERENTIAL/PLATELET - Abnormal; Notable for the following components:      Result Value   RBC 5.31 (*)    MCH 25.4 (*)    Neutro Abs 7.9 (*)    All other components within normal limits  BASIC METABOLIC PANEL - Abnormal; Notable for the following components:   Potassium 3.4 (*)    Glucose, Bld 178 (*)    All other components within normal limits  RESPIRATORY PANEL BY RT PCR (FLU A&B, COVID)  ETHANOL  RAPID URINE DRUG SCREEN, HOSP PERFORMED                                                                                                                         EKG  EKG Interpretation  Date/Time:  Wednesday January 30 2020 06:19:20 EDT Ventricular  Rate:  74 PR Interval:    QRS Duration: 91 QT Interval:  414 QTC Calculation: 460 R Axis:   51 Text Interpretation: Sinus rhythm Biatrial enlargement Left ventricular hypertrophy Anterior Q waves, possibly due to LVH Borderline T abnormalities, inferior leads No old tracing to compare Confirmed by Addison Lank [phone]) on 01/30/2020 6:20:44 AM      Radiology CT Angio Head W or Wo Contrast  Result Date: 01/30/2020 CLINICAL DATA:  Sudden onset headache. EXAM: CT ANGIOGRAPHY HEAD AND NECK TECHNIQUE: Multidetector CT imaging of the head and neck was performed using the standard protocol during bolus administration of intravenous contrast. Multiplanar CT image reconstructions and MIPs were obtained to evaluate the vascular anatomy. Carotid stenosis measurements (when applicable) are obtained utilizing NASCET criteria, using the distal internal carotid diameter as the denominator. CONTRAST:  88mL OMNIPAQUE IOHEXOL 350 MG/ML SOLN COMPARISON:  Contemporaneous noncontrast head CT FINDINGS: CTA NECK FINDINGS Aortic arch: No acute finding.  Three vessel branching. Right carotid system: Vessels are smooth and widely patent. Left carotid system: Vessels are smooth and widely patent. Vertebral arteries: Left vertebral artery dominance. No proximal subclavian stenosis. The vertebral arteries are smooth and widely patent. Skeleton: Advanced cervical spine degeneration. Dental caries with molar periapical erosions. Other neck: Goiter. Upper chest: Masslike appearance left of the upper thoracic esophagus which could be intramural or extra serosal, 2.3 cm in diameter on axial slices and 2.8 cm craniocaudal on coronal reformats. Review of the MIP images confirms the above findings CTA HEAD FINDINGS Anterior circulation: Atheromatous plaque along both carotid  siphons without flow reducing stenosis. No branch occlusion, generalized beading, or aneurysm seen. Posterior circulation: Left dominant vertebral artery. Projecting  posteriorly towards the medulla is a left PICA aneurysm which has a mildly irregular sac shape and measures up to 3 mm base to dome. Subjective moderate narrowing of the right V4 segment just before the PICA origin. The basilar is diffusely patent. No PCA branch occlusion or aneurysm. Venous sinuses: Negative for the arterial phase. The superior ophthalmic veins show prominent size and density, suggesting increase in intracranial pressure. Anatomic variants: As above Review of the MIP images confirms the above findings IMPRESSION: 1. Ruptured 3 mm left PICA aneurysm. 2. The left vertebral artery is dominant and diffusely patent. Moderate right V4 segment stenosis. 3. 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 After recovery, recommend chest CT with contrast. 4. No stenosis or significant atherosclerosis in the neck. 5. 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 Electronically Signed   By: Monte Fantasia M.D.   On: 01/30/2020 05:43   CT Head Wo Contrast  Result Date: 01/30/2020 CLINICAL DATA:  New or worsening headache, sudden onset EXAM: CT HEAD WITHOUT CONTRAST TECHNIQUE: Contiguous axial images were obtained from the base of the skull through the vertex without intravenous contrast. COMPARISON:  None. FINDINGS: Brain: Extensive subarachnoid hemorrhage greatest in the posterior fossa where there is encircling of the brainstem. Cranial extension along both sylvian fissures. There is intraventricular reflux reaching the lateral ventricles, with ventriculomegaly that is generalized. 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 No visible cortex infarct. There is brain atrophy. Vascular: Negative Skull: Negative Sinuses/Orbits: Left enucleation with prosthesis. Other: Critical Value/emergent results were called by telephone at the time of  interpretation on 01/30/2020 at 5:30 am to provider [facility] , who verbally acknowledged these results. IMPRESSION: 1. 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 2. Intraventricular reflux with hydrocephalus. 3. Chronic small vessel disease. Electronically Signed   By: Monte Fantasia M.D.   On: 01/30/2020 05:32   CT Angio Neck W and/or Wo Contrast  Result Date: 01/30/2020 CLINICAL DATA:  Sudden onset headache. EXAM: CT ANGIOGRAPHY HEAD AND NECK TECHNIQUE: Multidetector CT imaging of the head and neck was performed using the standard protocol during bolus administration of intravenous contrast. Multiplanar CT image reconstructions and MIPs were obtained to evaluate the vascular anatomy. Carotid stenosis measurements (when applicable) are obtained utilizing NASCET criteria, using the distal internal carotid diameter as the denominator. CONTRAST:  86mL OMNIPAQUE IOHEXOL 350 MG/ML SOLN COMPARISON:  Contemporaneous noncontrast head CT FINDINGS: CTA NECK FINDINGS Aortic arch: No acute finding.  Three vessel branching. Right carotid system: Vessels are smooth and widely patent. Left carotid system: Vessels are smooth and widely patent. Vertebral arteries: Left vertebral artery dominance. No proximal subclavian stenosis. The vertebral arteries are smooth and widely patent. Skeleton: Advanced cervical spine degeneration. Dental caries with molar periapical erosions. Other neck: Goiter. Upper chest: Masslike appearance left of the upper thoracic esophagus which could be intramural or extra serosal, 2.3 cm in diameter on axial slices and 2.8 cm craniocaudal on coronal reformats. Review of the MIP images confirms the above findings CTA HEAD FINDINGS Anterior circulation: Atheromatous plaque along both carotid siphons without flow reducing stenosis. No branch occlusion, generalized beading, or aneurysm seen. Posterior circulation: Left dominant vertebral  artery. Projecting posteriorly towards the medulla is  a left PICA aneurysm which has a mildly irregular sac shape and measures up to 3 mm base to dome. Subjective moderate narrowing of the right V4 segment just before the PICA origin. The basilar is diffusely patent. No PCA branch occlusion or aneurysm. Venous sinuses: Negative for the arterial phase. The superior ophthalmic veins show prominent size and density, suggesting increase in intracranial pressure. Anatomic variants: As above Review of the MIP images confirms the above findings IMPRESSION: 1. Ruptured 3 mm left PICA aneurysm. 2. The left vertebral artery is dominant and diffusely patent. Moderate right V4 segment stenosis. 3. 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 After recovery, recommend chest CT with contrast. 4. No stenosis or significant atherosclerosis in the neck. 5. 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 Electronically Signed   By: Monte Fantasia M.D.   On: 01/30/2020 05:43    Pertinent labs & imaging results that were available during my care of the patient were reviewed by me and considered in my medical decision making (see chart for details).  Medications Ordered in ED Medications  labetalol (NORMODYNE) injection 20 mg (20 mg Intravenous Given 01/30/20 0535)    And  clevidipine (CLEVIPREX) infusion 0.5 mg/mL (16 mg/hr Intravenous Rate/Dose Change 01/30/20 0620)  ondansetron (ZOFRAN) injection 4 mg (4 mg Intravenous Given 01/30/20 0445)  fentaNYL (SUBLIMAZE) injection 75 mcg (75 mcg Intravenous Given 01/30/20 0546)  iohexol (OMNIPAQUE) 350 MG/ML injection 80 mL (80 mLs Intravenous Contrast Given 01/30/20 0515)                                                                                                                                    Procedures .1-3 Lead EKG Interpretation Performed by: Fatima Blank,  MD Authorized by: Fatima Blank, MD     Interpretation: normal     ECG rate:  78   ECG rate assessment: normal     Rhythm: sinus rhythm     Ectopy: none     Conduction: normal   .Critical Care Performed by: Fatima Blank, MD Authorized by: Fatima Blank, MD   Critical care provider statement:    Critical care time (minutes):  45   Critical care was necessary to treat or prevent imminent or life-threatening deterioration of the following conditions:  CNS failure or compromise   Critical care was time spent personally by me on the following activities:  Discussions with consultants, evaluation of patient's response to treatment, examination of patient, ordering and performing treatments and interventions, ordering and review of laboratory studies, ordering and review of radiographic studies, pulse oximetry, re-evaluation of patient's condition, obtaining history from patient or surrogate and review of old charts Procedure Name: Intubation Date/Time: 01/30/2020 6:53 AM Performed by: Fatima Blank, MD Pre-anesthesia Checklist: Patient identified, Patient being monitored, Emergency Drugs available, Timeout performed and Suction available Oxygen Delivery Method:  Non-rebreather mask Preoxygenation: Pre-oxygenation with 100% oxygen Induction Type: Rapid sequence Ventilation: Mask ventilation without difficulty Laryngoscope Size: Glidescope Grade View: Grade I Tube size: 7.5 mm Number of attempts: 1 Airway Equipment and Method: Rigid stylet Placement Confirmation: ETT inserted through vocal cords under direct vision,  CO2 detector and Breath sounds checked- equal and bilateral Secured at: 27 cm Tube secured with: ETT holder Difficulty Due To: Difficult Airway- due to large tongue       (including critical care time)  Medical Decision Making / ED Course I have reviewed the nursing notes for this encounter and the patient's prior records (if  available in EHR or on provided paperwork).   Stacy Moran was evaluated in Emergency Department on 01/30/2020 for the symptoms described in the history of present illness. She was evaluated in the context of the global COVID-19 pandemic, which necessitated consideration that the patient might be at risk for infection with the SARS-CoV-2 virus that causes COVID-19. Institutional protocols and algorithms that pertain to the evaluation of patients at risk for COVID-19 are in a state of rapid change based on information released by regulatory bodies including the CDC and federal and state organizations. These policies and algorithms were followed during the patient's care in the ED.  Sudden onset headache in the setting of hypertension. No focal deficits noted on exam. ICH versus SAH.  Onset less than 6 hours.  CT scan obtained.  On my wet read I saw evidence of likely subarachnoid hemorrhage and called CT scan to add on CT angio head and neck which confirmed a PICA aneurysm rupture.  Patient was started on antihypertensive medications and given antiemetics and IV pain medicine.  Consult to neurosurgery and will see patient for admission.  Mental status declined. NSU requested securing the airway. Admitted for further management.      Final Clinical Impression(s) / ED Diagnoses Final diagnoses:  SAH (subarachnoid hemorrhage) ([place])  Ruptured aneurysm of artery ([place])      This chart was dictated using voice recognition software.  Despite best efforts to proofread,  errors can occur which can change the documentation meaning.   Fatima Blank, MD 01/30/20 [phone]

## 2020-01-30 NOTE — Progress Notes (Signed)
Initial Nutrition Assessment  DOCUMENTATION CODES:   Not applicable  INTERVENTION:   Recommend initiation of tube feeds via OG tube: - Vital High Protein @ 20 ml/hr (480 ml/day) - ProSource TF 45 ml five times daily  Tube feeding regimen provides 680 kcal, 97 grams of protein, and 401 ml of H2O.  Tube feeding regimen and current propofol and cleviprex provides 2410 total kcal (>100% of needs).  NUTRITION DIAGNOSIS:   Inadequate oral intake related to acute illness as evidenced by estimated needs.  GOAL:   Patient will meet greater than or equal to 90% of their needs  MONITOR:   Vent status, Labs, Weight trends, Skin, I & O's  REASON FOR ASSESSMENT:   Ventilator    ASSESSMENT:   64 year old female who presented to the ED on 11/03 with headache. 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 Pt required intubation for airway protection.   11/03 - s/p EVD at bedside  RD reached out to CCM regarding initiation of tube feeds. Per CCM, pt may be going to the OR today and will likely start TF tomorrow. RD to leave recommendations.  Discussed pt with RN at bedside who reports pt is s/p EVD placement at bedside today and did not go to OR.  OG tube in place with tip in distal stomach per x-ray reading.  Patient is currently intubated on ventilator support MV: 8.7 L/min Temp (24hrs), Avg:98.6 F (37 C), Min:97.5 F (36.4 C), Max:99.9 F (37.7 C) BP (cuff): 121/70 MAP (cuff): 84  Drips: Propofol: 11 ml/hr (provides 290 kcal daily from lipid) Cleviprex: 30 ml/hr (provides 1440 kcal daily from lipid) NS: 50 ml/hr  Medications reviewed and include: colace, SSI q 4 hours, protonix  Labs reviewed.  NUTRITION - FOCUSED PHYSICAL EXAM:    Most Recent Value  Orbital Region Mild depletion  Upper Arm Region No depletion  Thoracic and Lumbar Region No depletion  Buccal Region Unable to assess  Temple Region Mild depletion   Clavicle Bone Region Mild depletion  Clavicle and Acromion Bone Region Mild depletion  Scapular Bone Region Unable to assess  Dorsal Hand No depletion  Patellar Region No depletion  Anterior Thigh Region Mild depletion  Posterior Calf Region No depletion  Edema (RD Assessment) None  Hair Reviewed  Eyes Reviewed  Mouth Reviewed  Skin Reviewed  Nails Reviewed       Diet Order:   Diet Order            Diet NPO time specified  Diet effective now                 EDUCATION NEEDS:   No education needs have been identified at this time  Skin:  Skin Assessment: Reviewed RN Assessment  Last BM:  no documented BM  Height:   Ht Readings from Last 1 Encounters:  01/30/20 5\' 9"  (1.753 m)    Weight:   Wt Readings from Last 1 Encounters:  01/30/20 72.6 kg    Ideal Body Weight:  65.9 kg  BMI:  Body mass index is 23.63 kg/m.  Estimated Nutritional Needs:   Kcal:  1657  Protein:  95-110 grams  Fluid:  >/= 1.6 L    Gaynell Face, MS, RD, LDN Inpatient Clinical Dietitian Please see AMiON for contact information.

## 2020-01-30 NOTE — Progress Notes (Signed)
Dropped drain back to 80mmHg because drainage slowed remarkably.  Will continue to assess. Candice Lunney C 10:32 AM

## 2020-01-30 NOTE — Sedation Documentation (Signed)
5Fr sheath removed from RIGHT groin by Armando Reichert, RTR. Hemostasis achieved using Exoseal closure device. Groin level 0.

## 2020-01-30 NOTE — Transfer of Care (Signed)
Immediate Anesthesia Transfer of Care Note  Patient: Stacy Moran  Procedure(s) Performed: IR WITH ANESTHESIA (N/A )  Patient Location: patient transfered by crna from IR suite to OR for crani  Anesthesia Type:General  Level of Consciousness: Patient remains intubated per anesthesia plan  Airway & Oxygen Therapy: Patient placed on Ventilator (see vital sign flow sheet for setting)  Post-op Assessment: Post -op Vital signs reviewed and stable  Post vital signs: Reviewed and stable  Last Vitals:  Vitals Value Taken Time  BP    Temp    Pulse    Resp    SpO2      Last Pain:  Vitals:   01/30/20 1630  TempSrc: Axillary  PainSc:          Complications: No complications documented.

## 2020-01-30 NOTE — Consult Note (Signed)
NAME:  Stacy Moran, MRN:  [id number], DOB:  1955-11-06, LOS: 0 ADMISSION DATE:  01/30/2020, CONSULTATION DATE:  01/30/2020 REFERRING MD:  Ferne Reus, PA, CHIEF COMPLAINT:  Headache  Brief History   83 yoF presenting with sudden onset of worst headache of her life.  BP 262/123.  CT/ CTA showed large volume SAH secondary to ruptured PICA with resultant hydrocephalus. Progressive decline in mental status in ER requiring intubation for airway protection.  Admitted by NSGY.  PCCM consulted for vent management.  EVD to be placed on arrival to ICU.   History of present illness   HPI obtained from medical chart review as patient is intubated and sedated on mechanical ventilation.   64 year old female with no known prior medical history presenting with sudden onset of the worst headache of her life with associated nausea and vomiting.  Noted to be hypertensive with BP 262/123 in ER.  CT/ CTA head and neck showed large volume SAH secondary to ruptured PICA aneurysm with resultant hydrocephalus.  She was started on cleviprex and treated with fentanyl, however she had progressive decline in mental status, despite narcan, requiring intubation for airway protection.  Admitted by neurosurgery.  Plans for EVD on arrival to ICU and diagnostic angiogram with possible coiling today.  PCCM consulted for ventilator management.   Past Medical History  No known past medical history   Significant Hospital Events   11/3 Admitted  Consults:  PCCM  Procedures:  11/3 ETT >> 11/3 EVD >>  Significant Diagnostic Tests:  11/3 [facility] >> 1. 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 2. Intraventricular reflux with hydrocephalus. 3. Chronic small vessel disease.  11/3 CTA head/ neck >> 1. Ruptured 3 mm left PICA aneurysm. 2. The left vertebral artery is dominant and diffusely patent. Moderate right V4 segment stenosis. 3. 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 After recovery, recommend chest CT with contrast. 4. No stenosis or significant atherosclerosis in the neck. 5. 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  Micro Data:  11/3 SARS2/ Flu >> neg  Antimicrobials:  n/a  Interim history/subjective:  Cleviprex at 21 mg/hr, requiring labetalol prn for SBP goal < 140  Objective   Blood pressure (!) 155/81, pulse 70, temperature 98.4 F (36.9 C), temperature source Axillary, resp. rate 16, height 5\' 9"  (1.753 m), weight 72.6 kg, SpO2 100 %.    Vent Mode: PRVC FiO2 (%):  [40 %-100 %] 40 % Set Rate:  [16 bmp-18 bmp] 16 bmp Vt Set:  [530 mL] 530 mL PEEP:  [5 cmH20] 5 cmH20 Plateau Pressure:  [19 cmH20] 19 cmH20   Intake/Output Summary (Last 24 hours) at 01/30/2020 0350 Last data filed at 01/30/2020 0900 Gross per 24 hour  Intake --  Output 75 ml  Net -75 ml   Filed Weights   01/30/20 0657  Weight: 72.6 kg    Examination: General: no acute distress HEENT: NCAT,  R pupil pinpoint and minimally reactive, L eye injury, MMMP Lungs: CTA, no wheezes, rhonchi, rales Cardiovascular: RRR, no m/g/r Abdomen: soft, NT,ND, BS+ Extremities: no clubbing, cyanosis or edema Skin: no rashes, warm and dry Neuro: shivering like movements in RUE biting on tube GU: deferred  Resolved Hospital Problem list     Assessment & Plan:  Acute sah 2/2 L PICA aneurysm rupture and hydrocephalus: -per neurosx -keppra, nimotop -tcd pending -evd being  placed -pending diagnositc angio with possible coiling vs clipping HA Acute encephalopathy 2/2 above  Acute resp failure:  -2/2 neuro status -intubated for airway protection -titrate vent  Hypertensive emergency:  -on cleviprex at this time -prn labetalol unless HR <60 -goal sbp <140  Hypokalemia:  -replace  Hyperglcyemia:  -check a1c  Mediastinal mass:  -f/u based on  clinical course Best practice:  Diet: npo Pain/Anxiety/Delirium protocol (if indicated): per protocol VAP protocol (if indicated): per protocol DVT prophylaxis: scd GI prophylaxis: ppi Glucose control: monitoring Mobility: bedrest Code Status: FULL Family Communication: per primary Disposition: ICU  Labs   CBC: Recent Labs  Lab 01/30/20 0427 01/30/20 0753  WBC 10.3  --   NEUTROABS 7.9*  --   HGB 13.5 14.6  HCT 43.8 43.0  MCV 82.5  --   PLT 168  --     Basic Metabolic Panel: Recent Labs  Lab 01/30/20 0427 01/30/20 0753  NA 141 141  K 3.4* 3.8  CL 104  --   CO2 27  --   GLUCOSE 178*  --   BUN 20  --   CREATININE 0.84  --   CALCIUM 9.6  --    GFR: Estimated Creatinine Clearance: 72.6 mL/min (by C-G formula based on SCr of 0.84 mg/dL). Recent Labs  Lab 01/30/20 0427  WBC 10.3    Liver Function Tests: No results for input(s): AST, ALT, ALKPHOS, BILITOT, PROT, ALBUMIN in the last 168 hours. No results for input(s): LIPASE, AMYLASE in the last 168 hours. No results for input(s): AMMONIA in the last 168 hours.  ABG    Component Value Date/Time   PHART 7.431 01/30/2020 0753   PCO2ART 38.7 01/30/2020 0753   PO2ART 128 (H) 01/30/2020 0753   HCO3 25.8 01/30/2020 0753   TCO2 27 01/30/2020 0753   O2SAT 99.0 01/30/2020 0753     Coagulation Profile: No results for input(s): INR, PROTIME in the last 168 hours.  Cardiac Enzymes: No results for input(s): CKTOTAL, CKMB, CKMBINDEX, TROPONINI in the last 168 hours.  HbA1C: No results found for: HGBA1C  CBG: No results for input(s): GLUCAP in the last 168 hours.  Review of Systems:   unobtainable  Past Medical History  She,  has no past medical history on file.   Surgical History   History reviewed. No pertinent surgical history.   Social History      Family History   Her family history is not on file.   Allergies No Known Allergies   Home Medications  Prior to Admission medications   Not on  File     Critical care time: The patient is critically ill with multiple organ systems failure and requires high complexity decision making for assessment and support, frequent evaluation and titration of therapies, application of advanced monitoring technologies and extensive interpretation of multiple databases.  Critical care time 39 mins. This represents my time independent of the NP's/PA's/med students/residents time taking care of the pt. This is excluding procedures.     [facility] 01/30/2020, 9:07 AM

## 2020-01-30 NOTE — Sedation Documentation (Signed)
RIGHT groin level 0, gauze/tegaderm bandage applied, CDI. 2+RDP, 1+RPT.

## 2020-01-31 ENCOUNTER — Inpatient Hospital Stay (HOSPITAL_COMMUNITY): Payer: Self-pay

## 2020-01-31 ENCOUNTER — Encounter (HOSPITAL_COMMUNITY): Payer: Self-pay | Admitting: Neurosurgery

## 2020-01-31 DIAGNOSIS — J9601 Acute respiratory failure with hypoxia: Secondary | ICD-10-CM

## 2020-01-31 DIAGNOSIS — I609 Nontraumatic subarachnoid hemorrhage, unspecified: Secondary | ICD-10-CM

## 2020-01-31 LAB — POCT I-STAT 7, (LYTES, BLD GAS, ICA,H+H)
Acid-base deficit: 1 mmol/L (ref 0.0–2.0)
Acid-base deficit: 4 mmol/L — ABNORMAL HIGH (ref 0.0–2.0)
Acid-base deficit: 5 mmol/L — ABNORMAL HIGH (ref 0.0–2.0)
Bicarbonate: 21 mmol/L (ref 20.0–28.0)
Bicarbonate: 22.1 mmol/L (ref 20.0–28.0)
Bicarbonate: 24.1 mmol/L (ref 20.0–28.0)
Calcium, Ion: 1.06 mmol/L — ABNORMAL LOW (ref 1.15–1.40)
Calcium, Ion: 1.07 mmol/L — ABNORMAL LOW (ref 1.15–1.40)
Calcium, Ion: 1.12 mmol/L — ABNORMAL LOW (ref 1.15–1.40)
HCT: 29 % — ABNORMAL LOW (ref 36.0–46.0)
HCT: 29 % — ABNORMAL LOW (ref 36.0–46.0)
HCT: 32 % — ABNORMAL LOW (ref 36.0–46.0)
Hemoglobin: 10.9 g/dL — ABNORMAL LOW (ref 12.0–15.0)
Hemoglobin: 9.9 g/dL — ABNORMAL LOW (ref 12.0–15.0)
Hemoglobin: 9.9 g/dL — ABNORMAL LOW (ref 12.0–15.0)
O2 Saturation: 100 %
O2 Saturation: 100 %
O2 Saturation: 100 %
Patient temperature: 35.8
Patient temperature: 35.9
Potassium: 4.5 mmol/L (ref 3.5–5.1)
Potassium: 4.8 mmol/L (ref 3.5–5.1)
Potassium: 5 mmol/L (ref 3.5–5.1)
Sodium: 140 mmol/L (ref 135–145)
Sodium: 140 mmol/L (ref 135–145)
Sodium: 140 mmol/L (ref 135–145)
TCO2: 22 mmol/L (ref 22–32)
TCO2: 23 mmol/L (ref 22–32)
TCO2: 25 mmol/L (ref 22–32)
pCO2 arterial: 38.5 mmHg (ref 32.0–48.0)
pCO2 arterial: 38.6 mmHg (ref 32.0–48.0)
pCO2 arterial: 40.4 mmHg (ref 32.0–48.0)
pH, Arterial: 7.339 — ABNORMAL LOW (ref 7.350–7.450)
pH, Arterial: 7.34 — ABNORMAL LOW (ref 7.350–7.450)
pH, Arterial: 7.403 (ref 7.350–7.450)
pO2, Arterial: 231 mmHg — ABNORMAL HIGH (ref 83.0–108.0)
pO2, Arterial: 233 mmHg — ABNORMAL HIGH (ref 83.0–108.0)
pO2, Arterial: 305 mmHg — ABNORMAL HIGH (ref 83.0–108.0)

## 2020-01-31 LAB — RENAL FUNCTION PANEL
Albumin: 3.3 g/dL — ABNORMAL LOW (ref 3.5–5.0)
Anion gap: 14 (ref 5–15)
BUN: 23 mg/dL (ref 8–23)
CO2: 15 mmol/L — ABNORMAL LOW (ref 22–32)
Calcium: 8.5 mg/dL — ABNORMAL LOW (ref 8.9–10.3)
Chloride: 112 mmol/L — ABNORMAL HIGH (ref 98–111)
Creatinine, Ser: 0.9 mg/dL (ref 0.44–1.00)
GFR, Estimated: >60 mL/min (ref >60)
Glucose, Bld: 248 mg/dL — ABNORMAL HIGH (ref 70–99)
Phosphorus: 3 mg/dL (ref 2.5–4.6)
Potassium: 3.9 mmol/L (ref 3.5–5.1)
Sodium: 141 mmol/L (ref 135–145)

## 2020-01-31 LAB — MAGNESIUM
Magnesium: 2.1 mg/dL (ref 1.7–2.4)
Magnesium: 2.1 mg/dL (ref 1.7–2.4)

## 2020-01-31 LAB — CBC
HCT: 36.7 % (ref 36.0–46.0)
Hemoglobin: 11.6 g/dL — ABNORMAL LOW (ref 12.0–15.0)
MCH: 25.9 pg — ABNORMAL LOW (ref 26.0–34.0)
MCHC: 31.6 g/dL (ref 30.0–36.0)
MCV: 81.9 fL (ref 80.0–100.0)
Platelets: 114 10*3/uL — ABNORMAL LOW (ref 150–400)
RBC: 4.48 MIL/uL (ref 3.87–5.11)
RDW: 15.6 % — ABNORMAL HIGH (ref 11.5–15.5)
WBC: 13.3 10*3/uL — ABNORMAL HIGH (ref 4.0–10.5)
nRBC: 0 % (ref 0.0–0.2)

## 2020-01-31 LAB — GLUCOSE, CAPILLARY
Glucose-Capillary: 224 mg/dL — ABNORMAL HIGH (ref 70–99)
Glucose-Capillary: 233 mg/dL — ABNORMAL HIGH (ref 70–99)
Glucose-Capillary: 178 mg/dL — ABNORMAL HIGH (ref 70–99)
Glucose-Capillary: 200 mg/dL — ABNORMAL HIGH (ref 70–99)
Glucose-Capillary: 154 mg/dL — ABNORMAL HIGH (ref 70–99)
Glucose-Capillary: 130 mg/dL — ABNORMAL HIGH (ref 70–99)

## 2020-01-31 LAB — TRIGLYCERIDES: Triglycerides: 320 mg/dL — ABNORMAL HIGH (ref <150)

## 2020-01-31 LAB — PHOSPHORUS: Phosphorus: 3.7 mg/dL (ref 2.5–4.6)

## 2020-01-31 MED ORDER — INDIGOTINDISULFONATE SODIUM 8 MG/ML IJ SOLN
INTRAMUSCULAR | Status: DC | PRN
  Administered 2020-01-31: 12.5 mg via INTRAVENOUS

## 2020-01-31 MED ORDER — VITAL HIGH PROTEIN PO LIQD
1000.0000 mL | ORAL | Status: DC
  Administered 2020-01-31 – 2020-02-05 (×6): 1000 mL
  Filled 2020-01-31 (×5): qty 1000

## 2020-01-31 MED ORDER — LOSARTAN POTASSIUM 50 MG PO TABS
100.0000 mg | ORAL_TABLET | Freq: Every day | ORAL | Status: DC
  Administered 2020-01-31 – 2020-02-23 (×17): 100 mg
  Filled 2020-01-31 (×18): qty 2

## 2020-01-31 MED ORDER — INSULIN ASPART 100 UNIT/ML ~~LOC~~ SOLN
0.0000 [IU] | SUBCUTANEOUS | Status: DC
  Administered 2020-01-31 (×2): 3 [IU] via SUBCUTANEOUS
  Administered 2020-01-31: 2 [IU] via SUBCUTANEOUS
  Administered 2020-01-31: 3 [IU] via SUBCUTANEOUS
  Administered 2020-01-31: 5 [IU] via SUBCUTANEOUS
  Administered 2020-02-01 – 2020-02-02 (×6): 3 [IU] via SUBCUTANEOUS
  Administered 2020-02-02: 5 [IU] via SUBCUTANEOUS
  Administered 2020-02-02 – 2020-02-03 (×4): 3 [IU] via SUBCUTANEOUS
  Administered 2020-02-03: 2 [IU] via SUBCUTANEOUS
  Administered 2020-02-03: 3 [IU] via SUBCUTANEOUS
  Administered 2020-02-03 – 2020-02-04 (×6): 2 [IU] via SUBCUTANEOUS
  Administered 2020-02-04: 3 [IU] via SUBCUTANEOUS
  Administered 2020-02-04 (×3): 2 [IU] via SUBCUTANEOUS
  Administered 2020-02-05: 3 [IU] via SUBCUTANEOUS
  Administered 2020-02-05: 2 [IU] via SUBCUTANEOUS
  Administered 2020-02-05: 3 [IU] via SUBCUTANEOUS
  Administered 2020-02-05: 2 [IU] via SUBCUTANEOUS
  Administered 2020-02-06: 5 [IU] via SUBCUTANEOUS
  Administered 2020-02-06: 3 [IU] via SUBCUTANEOUS
  Administered 2020-02-06 (×2): 2 [IU] via SUBCUTANEOUS
  Administered 2020-02-06 – 2020-02-07 (×3): 3 [IU] via SUBCUTANEOUS
  Administered 2020-02-07 (×2): 2 [IU] via SUBCUTANEOUS
  Administered 2020-02-07: 3 [IU] via SUBCUTANEOUS
  Administered 2020-02-07 – 2020-02-08 (×2): 2 [IU] via SUBCUTANEOUS
  Administered 2020-02-08: 3 [IU] via SUBCUTANEOUS
  Administered 2020-02-08: 2 [IU] via SUBCUTANEOUS
  Administered 2020-02-08: 3 [IU] via SUBCUTANEOUS
  Administered 2020-02-08: 2 [IU] via SUBCUTANEOUS
  Administered 2020-02-09 (×2): 5 [IU] via SUBCUTANEOUS

## 2020-01-31 MED ORDER — HYDRALAZINE HCL 20 MG/ML IJ SOLN
10.0000 mg | INTRAMUSCULAR | Status: DC | PRN
  Administered 2020-01-31 – 2020-02-01 (×4): 10 mg via INTRAVENOUS
  Filled 2020-01-31 (×5): qty 1

## 2020-01-31 MED ORDER — FUROSEMIDE 10 MG/ML IJ SOLN
40.0000 mg | Freq: Once | INTRAMUSCULAR | Status: AC
  Administered 2020-01-31: 40 mg via INTRAVENOUS
  Filled 2020-01-31: qty 4

## 2020-01-31 MED ORDER — LABETALOL HCL 5 MG/ML IV SOLN
20.0000 mg | INTRAVENOUS | Status: DC | PRN
  Administered 2020-02-01 – 2020-02-16 (×9): 20 mg via INTRAVENOUS
  Filled 2020-01-31 (×11): qty 4

## 2020-01-31 MED ORDER — PROSOURCE TF PO LIQD
45.0000 mL | Freq: Every day | ORAL | Status: DC
  Administered 2020-01-31 – 2020-02-06 (×29): 45 mL
  Filled 2020-01-31 (×30): qty 45

## 2020-01-31 MED FILL — Thrombin For Soln 5000 Unit: CUTANEOUS | Qty: 5000 | Status: AC

## 2020-01-31 NOTE — Progress Notes (Signed)
Nutrition Follow-up  DOCUMENTATION CODES:   Not applicable  INTERVENTION:   Tube Feeding via OG:  - Vital High Protein @ 20 ml/hr (480 ml/day) - ProSource TF 45 ml five times daily  Tube feeding regimen provides 680 kcal, 97 grams of protein, and 401 ml of H2O.  Tube feeding regimen and current propofol and cleviprex provides 2705 total kcal (>100% of needs).   NUTRITION DIAGNOSIS:   Inadequate oral intake related to acute illness as evidenced by estimated needs.  Being addressed via TF   GOAL:   Patient will meet greater than or equal to 90% of their needs  Progressing  MONITOR:   Vent status, Labs, Weight trends, Skin, I & O's  REASON FOR ASSESSMENT:   Ventilator    ASSESSMENT:   64 year old female who presented to the ED on 11/03 with headache. 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 Pt required intubation for airway protection.  11/03 Admitted 11/04 SAH, L PICA aneurysm to OR for craniotomy, clipping  Patient is currently intubated on ventilator support, sedated on fentanyl and propofol MV: 8.5 L/min Temp (24hrs), Avg:98.9 F (37.2 C), Min:97.4 F (36.3 C), Max:100.1 F (37.8 C)  Propofol: 3.3 ml/hr Cleviprex: 40.5 ml/hr  Labs: reviewed Meds: NS at 50 ml/hr, colace, ss novolog   Diet Order:   Diet Order            Diet NPO time specified  Diet effective now                 EDUCATION NEEDS:   No education needs have been identified at this time  Skin:  Skin Assessment: Reviewed RN Assessment  Last BM:  no documented BM  Height:   Ht Readings from Last 1 Encounters:  01/30/20 5\' 9"  (1.753 m)    Weight:   Wt Readings from Last 1 Encounters:  01/30/20 72.6 kg    Ideal Body Weight:  65.9 kg  BMI:  Body mass index is 23.63 kg/m.  Estimated Nutritional Needs:   Kcal:  1657  Protein:  95-110 grams  Fluid:  >/= 1.6 L   Kerman Passey MS, RDN, LDN, CNSC Registered  Dietitian III Clinical Nutrition RD Pager and On-Call Pager Number Located in [place]

## 2020-01-31 NOTE — Anesthesia Postprocedure Evaluation (Signed)
Anesthesia Post Note  Patient: Stacy Moran  Procedure(s) Performed: LEFT FAR LATERAL CRANIOTOMY FOR ANEURYSM CLIPPING (Left )     Patient location during evaluation: SICU Anesthesia Type: General Level of consciousness: sedated Pain management: pain level controlled Vital Signs Assessment: post-procedure vital signs reviewed and stable Respiratory status: patient remains intubated per anesthesia plan Cardiovascular status: stable Postop Assessment: no apparent nausea or vomiting Anesthetic complications: no   No complications documented.  Last Vitals:  Vitals:   01/31/20 0415 01/31/20 0430  BP: 133/75 130/77  Pulse: 81 81  Resp: 16 16  Temp:    SpO2: 100% 100%    Last Pain:  Vitals:   01/31/20 0400  TempSrc: Oral  PainSc:                  [name] Nathanie Ottley

## 2020-01-31 NOTE — Progress Notes (Signed)
OT Cancellation Note  Patient Details Name: Stacy Moran MRN: [id number] DOB: Dec 04, 1955   Cancelled Treatment:    Reason Eval/Treat Not Completed: Patient not medically ready.  Will reattempt as medically appropriate.  Nilsa Nutting., OTR/L Acute Rehabilitation Services Pager [phone] Office [phone]   Lucille Passy M 01/31/2020, 10:10 AM

## 2020-01-31 NOTE — Progress Notes (Signed)
Transcranial Doppler  Date POD PCO2 HCT BP  MCA ACA PCA OPHT SIPH VERT Basilar  11/4 RH     Right  Left   *  *   *  *   *  *   20  16   53  23   *  21   *  *         Right  Left                                            Right  Left                                             Right  Left                                             Right  Left                                            Right  Left                                            Right  Left                                        MCA = Middle Cerebral Artery      OPHT = Opthalmic Artery     BASILAR = Basilar Artery   ACA = Anterior Cerebral Artery     SIPH = Carotid Siphon PCA = Posterior Cerebral Artery   VERT = Verterbral Artery                 *- Unable to insonate   Normal MCA = 62+\-12 ACA = 50+\-12 PCA = 42+\-23   Darlin Coco, RDMS

## 2020-01-31 NOTE — Progress Notes (Signed)
[place] Progress Note Patient Name: Stacy Moran DOB: [birth year] MRN: [id number]   Date of Service  01/31/2020  HPI/Events of Note  Hypertension - BP = 159/68. Patient on ceiling dose of Cleviprex IV infusion.   eICU Interventions  Plan: 1. Hydralazine 10 mg IV Q 4 hours PRN SBP > 140.      Intervention Category Major Interventions: Hypertension - evaluation and management  Jakeim Sedore [place] 01/31/2020, 4:50 AM

## 2020-01-31 NOTE — Anesthesia Preprocedure Evaluation (Signed)
Anesthesia Evaluation  Patient identified by MRN, date of birth, ID band Patient unresponsive    Reviewed: Patient's Chart, lab work & pertinent test results, Unable to perform ROS - Chart review onlyPreop documentation limited or incomplete due to emergent nature of procedure.  History of Anesthesia Complications Negative for: history of anesthetic complications  Airway Mallampati: Intubated       Dental   Pulmonary  Intubated already for MS decline with large SAH, PICA rupture 01/30/2020 SARS coronavirus NEG   breath sounds clear to auscultation       Cardiovascular  Rhythm:Regular Rate:Tachycardia  unknown   Neuro/Psych Ruptured cerebral aneurysm: large SAH, with PICA rupture, has ventric now     GI/Hepatic unknown   Endo/Other  unknown  Renal/GU      Musculoskeletal   Abdominal   Peds  Hematology   Anesthesia Other Findings   Reproductive/Obstetrics                             Anesthesia Physical  Anesthesia Plan  ASA: IV and emergent  Anesthesia Plan: General   Post-op Pain Management:    Induction: Inhalational  PONV Risk Score and Plan: 3 and Treatment may vary due to age or medical condition  Airway Management Planned: Oral ETT  Additional Equipment: Arterial line  Intra-op Plan:   Post-operative Plan: Post-operative intubation/ventilation  Informed Consent:     Only emergency history available  Plan Discussed with: CRNA and Surgeon  Anesthesia Plan Comments: (No family available, pt intubated, Dr. Kathyrn Sheriff verifies emergency)        Anesthesia Quick Evaluation

## 2020-01-31 NOTE — Progress Notes (Signed)
SLP Cancellation Note  Patient Details Name: Jalina Blowers MRN: [id number] DOB: 1955/06/06   Cancelled treatment:       Reason Eval/Treat Not Completed: Medical issues which prohibited therapy. Pt remains on vent this am. Will f/u as able.   Osie Bond., M.A. [place] Acute Rehabilitation Services Pager [phone] Office [phone]  01/31/2020, 8:08 AM

## 2020-01-31 NOTE — Transfer of Care (Signed)
Immediate Anesthesia Transfer of Care Note  Patient: Skylinn Vialpando  Procedure(s) Performed: LEFT FAR LATERAL CRANIOTOMY FOR ANEURYSM CLIPPING (Left )  Patient Location: ICU  Anesthesia Type:General  Level of Consciousness: sedated, unresponsive and Patient remains intubated per anesthesia plan  Airway & Oxygen Therapy: Patient remains intubated per anesthesia plan and Patient placed on Ventilator (see vital sign flow sheet for setting)  Post-op Assessment: Report given to RN and Post -op Vital signs reviewed and stable  Post vital signs: Reviewed and stable  Last Vitals:  Vitals Value Taken Time  BP    Temp    Pulse 80 01/31/20 0211  Resp 9 01/31/20 0211  SpO2 100 % 01/31/20 0211  Vitals shown include unvalidated device data.  Last Pain:  Vitals:   01/30/20 1630  TempSrc: Axillary  PainSc:          Complications: No complications documented.

## 2020-01-31 NOTE — Progress Notes (Signed)
Pt at max dose of cleviprex and still not meeting SBP goal of <140. New orders pending. RN will continue to monitor.

## 2020-01-31 NOTE — Progress Notes (Signed)
Called Elink to alert CCM that patient sys BP was still >140 despite prn hydralazine and maxed cleviprex gtt.  Awaiting new orders.

## 2020-01-31 NOTE — Op Note (Addendum)
NEUROSURGERY OPERATIVE NOTE   PREOP DIAGNOSIS:  Subarachnoid Hemorrhage Left PICA aneurysm   POSTOP DIAGNOSIS: Same  PROCEDURE: Left Far-Lateral trans-tubercular suboccipital craniotomy for clipping of PICA aneurysm, complex due to intraoperative rupture and need for temporary clipping Intraoperative ICG videoangiography Use of intraoperative microscope for microdissection  SURGEON: Dr. [name], MD  ASSISTANT: Dr. Ashok Pall, MD  ANESTHESIA: General Endotracheal  EBL: 500cc  SPECIMENS: None  DRAINS: None  COMPLICATIONS: None immediate  CONDITION: Hemodynamically stable to PACU  HISTORY: Stacy Moran is a 64 y.o. female presenting to the hospital after sudden onset of headache.  She required intubation for airway protection.  CT scan demonstrated diffuse primarily posterior fossa subarachnoid hemorrhage with intraventricular extension and hydrocephalus.  CT angiogram confirmed the presence of an left PICA aneurysm.  She underwent placement of a external ventricular drain.  She then underwent diagnostic cerebral angiogram further delineating the left PICA aneurysm.  It was noted not to be amenable to endovascular treatment.  She therefore presents to the operating room for surgical clip ligation.  Due to the patient's mental status and lack of any available family to act as a healthcare proxy, we proceeded with surgery under emergent conditions with implied consent.  PROCEDURE IN DETAIL: The patient was brought to the operating room. After induction of general anesthesia, the patient was positioned on the operative table in the [place] head holder in the right lateral decubitus position. All pressure points were meticulously padded.  A hockey-stick shaped incision was then marked out extending from the retroauricular region to the inion and down to about the C7 level.  The area was then prepped and draped in the usual sterile fashion.  After timeout was conducted, the  incision was infiltrated with local anesthetic without epinephrine.  Incision was then carried down through the fascia.  The suboccipital musculature was identified and divided in the avascular midline plane until the spinous processes of the cervical spine were identified.  The occipital bone was also identified.  Subperiosteal dissection was then carried out along the left lateral aspect of the occipital bone, as well as the left hemilamina of C1 and C2 and a portion of C3.  In this way, the suboccipital musculature was reflected laterally.  The vertebral artery was identified coursing just above the sulcus arteriosus of C1.  This was dissected out laterally to its emergence from the left C1 foramen transversarium.  At this point the high-speed drill was used to complete C1 laminectomy with retraction of the vertebral artery superiorly.  Laminectomy was completed with Kerrison punches.  The lateral aspect of the C1 spinal canal was identified.  The atlantooccipital membrane was then identified and divided in the midline.  This allowed identification of the underlying dura and dissection was carried laterally to identify the lateral aspect of the foramen magnum.  High-speed drill with a craniotome was then used to create a small left lateral suboccipital craniotomy.  Hemostasis on the epidural plane was easily secured with bipolar electrocautery and morselized Gelfoam with thrombin.  At this point, a portion of the perivertebral venous plexus was coagulated and divided in order to adequately identify the entrance of the vertebral artery into the dura.  The dura was then opened in curvilinear fashion based laterally.  Dural edges were tacked up with 4 Nurolon stitches.  Microscope was then draped sterilely and brought into the field, and the remainder of the case was done under the microscope using microdissection technique.  Initially, the arachnoid overlying the upper  cervical spinal cord/medulla was opened  sharply.  There was a significant amount of subarachnoid blood in this region.  The spinal roots of the accessory cranial nerve were identified.  Dissection was then carried out lateral to the medulla to identify the vertebral artery.  Initially, we carried our dissection along the vertebral artery cranially.  This allowed identification of both the hypoglossal nerve rootlets, as well as the ninth, 10th, and 11th nerve roots as they entered the jugular foramen.  We then dissected further cranially along the vertebral artery however we were not able to easily identify the origin of the left PICA.  The jugular tubercle was noted to be preventing adequate view of the more cranial vertebral artery.  We therefore elected to remove a portion of the jugular tubercle.  The dura overlying the tubercle was coagulated and incised.  The dura was then elevated with a microdissector.  3 mm coarse diamond bur was then used to drill away the jugular tubercle.  The remainder of the dura was then coagulated.  This allowed direct unobstructed access to the more cranial vertebral artery.  Further dissection along the vertebral artery was carried out, and we were able to identify what appeared to be the smaller contralateral vertebral artery at the vertebrobasilar junction.  This indicated that we were likely dissecting too far cranially, and the origin of the PICA was likely more inferior.  We therefore turned our attention more inferiorly below the level of the jugular tubercle.  Further dissection of the vertebral artery just distal to its entrance from the dura required cutting of the upper dentate ligament.  We were then able to identify the more proximal PICA and its origin at the vertebral artery.  Dissection along the medial aspect of the PICA origin led to intraoperative rupture of the aneurysm.  Bleeding was controlled with 2 sections.  A temporary clip was applied to the proximal V4 segment of the vertebral artery, however  brisk arterial bleeding from the aneurysm continued due to backfilling from the contralateral vertebral artery.  We therefore removed the temporary clip from the left vertebral artery.  While suction was used to control the arterial bleeding, a small curved 6.6 mm mini clip was selected and placed across the medial aspect of the base of the left PICA, taking care to preserve the PICA origin.  Once the clip was applied, aneurysm bleeding ceased.  Gentle dissection along the clip appeared did not seem to indicate occlusion of the PICA nor kinking of the vertebral artery.  Lack of arterial bleeding from the aneurysm rupture indicated good clipping.  At this point, the anesthesia service administered a 12.5 mg dose of indocyanine green.  Video angiography was undertaken which clearly demonstrated patency of both the proximal and distal vertebral artery as well as the left PICA.  The wound was then irrigated with normal saline irrigation.  No active bleeding was identified.  The dura was then reapproximated with interrupted 4-0 Nurolon stitches.  A DuraGen inlay and onlay graft was used for a portion of the dura that was unable to be reapproximated superiorly.  The region was then covered with a layer of Adherus dural sealant.  The muscle was then reapproximated with interrupted 0 Vicryl stitches.  The fascia was reapproximated with interrupted 0 Vicryl stitches.  The subcutaneous layer was then reapproximated with interrupted 0 Vicryl stitches and the skin was closed with staples.  Bacitracin ointment and sterile dressings were applied.  At the end of the case  all sponge, needle, instrument, and cottonoid counts were correct.  The patient was then transferred to the stretcher and the [place]field head holder was removed.  She was then taken back to the neuro intensive care unit, intubated but in stable hemodynamic condition.

## 2020-01-31 NOTE — Progress Notes (Signed)
PT Cancellation Note  Patient Details Name: Stacy Moran MRN: [id number] DOB: 10-28-1955   Cancelled Treatment:    Reason Eval/Treat Not Completed: Medical issues which prohibited therapy;Patient not medically ready.   01/31/2020  [name], PT Acute Rehabilitation Services [phone]  (pager) [phone]  (office)   Stacy Moran 01/31/2020, 10:38 AM

## 2020-01-31 NOTE — Progress Notes (Addendum)
  NEUROSURGERY PROGRESS NOTE   HTN issues overnight Patient remains intubated  EXAM:  BP (!) 141/77   Pulse 91   Temp (!) 97.4 F (36.3 C) (Oral)   Resp 16   Ht 5\' 9"  (1.753 m)   Wt 72.6 kg   SpO2 100%   BMI 23.63 kg/m   Intubated Does not open eyes to voice Right pupil 37mm, reactive Left pupil damaged from prior surgery +corneal right, + gag/cough ?purposeful with RUE Does move RUE, RLE and LLE toes to central pain EVD in place. Functioning, bloody CSF Crani site: c/d/i  IMPRESSION/PLAN 64 y.o. female POD1 SAH day2 crani for clipping of left PICA aneurysm. Appears grossly stable. - Continue supportive care, nimotop, keppra - TCDs today for baseline - continue EVD    I have seen and examined Nyoka Cowden and agree with the exam, impression, and plan as documented in the note above by Ferne Reus, PA-C.  POD#1 SAH d#1 s/p clipping of left PICA aneurysm. Exam appears largely stable from preop, although ?left hemiparesis upper worse than lower.  - Cont supportive care, SBP < 115mmHg for today - Continue EVD drain open at 73mmHg - Vent mgmt per PCCM  [name], MD [facility]

## 2020-01-31 NOTE — Progress Notes (Signed)
NAME:  Stacy Moran, MRN:  [id number], DOB:  [birth year], LOS: 0 ADMISSION DATE:  01/30/2020, CONSULTATION DATE:  01/30/2020 REFERRING MD:  Ferne Reus, PA, CHIEF COMPLAINT:  Headache  Brief History   51 yoF presenting with sudden onset of worst headache of her life.  BP 262/123.  CT/ CTA showed large volume SAH secondary to ruptured PICA with resultant hydrocephalus. Progressive decline in mental status in ER requiring intubation for airway protection.  Admitted by NSGY.  PCCM consulted for vent management.  EVD to be placed on arrival to ICU.   History of present illness   HPI obtained from medical chart review as patient is intubated and sedated on mechanical ventilation.   64 year old female with no known prior medical history presenting with sudden onset of the worst headache of her life with associated nausea and vomiting.  Noted to be hypertensive with BP 262/123 in ER.  CT/ CTA head and neck showed large volume SAH secondary to ruptured PICA aneurysm with resultant hydrocephalus.  She was started on cleviprex and treated with fentanyl, however she had progressive decline in mental status, despite narcan, requiring intubation for airway protection.  Admitted by neurosurgery.  Plans for EVD on arrival to ICU and diagnostic angiogram with possible coiling today.  PCCM consulted for ventilator management.   Past Medical History  No known past medical history   Significant Hospital Events   11/3 Admitted  Consults:  PCCM  Procedures:  11/3 ETT >> 11/3 EVD >>  Significant Diagnostic Tests:  11/3 [facility] >> 1. 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 2. Intraventricular reflux with hydrocephalus. 3. Chronic small vessel disease.  11/3 CTA head/ neck >> 1. Ruptured 3 mm left PICA aneurysm. 2. The left vertebral artery is dominant and diffusely patent. Moderate right V4 segment stenosis. 3. 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 After recovery, recommend chest CT with contrast. 4. No stenosis or significant atherosclerosis in the neck. 5. 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  Micro Data:  11/3 SARS2/ Flu >> neg  Antimicrobials:  n/a  Interim history/subjective:  11/4: remains on max cleviprex, prn meds with still increased SBP. Will add more home meds to help with BP control, goal remains <140. S/p failed coil and req crani for clipping.   11/3: Cleviprex at 21 mg/hr, requiring labetalol prn for SBP goal < 140  Objective   Blood pressure (!) 141/77, pulse 91, temperature (!) 97.4 F (36.3 C), temperature source Oral, resp. rate 16, height 5\' 9"  (1.753 m), weight 72.6 kg, SpO2 100 %.    Vent Mode: PRVC FiO2 (%):  [40 %] 40 % Set Rate:  [16 bmp] 16 bmp Vt Set:  [530 mL] 530 mL PEEP:  [5 cmH20] 5 cmH20 Plateau Pressure:  [16 cmH20-18 cmH20] 18 cmH20   Intake/Output Summary (Last 24 hours) at 01/31/2020 0818 Last data filed at 01/31/2020 0700 Gross per 24 hour  Intake 3429.24 ml  Output 2449 ml  Net 980.24 ml   Filed Weights   01/30/20 0657  Weight: 72.6 kg    Examination: General: no acute distress HEENT: NCAT,  R pupil pinpoint and minimally reactive, L eye injury/false, MMMP Lungs: CTA, no wheezes, rhonchi, rales Cardiovascular: RRR, no m/g/r Abdomen: soft, NT,ND, BS+ Extremities: no clubbing, cyanosis or edema Skin: no rashes, warm and dry Neuro: flaccid L Upper and  lower extremity on my exam to noxious stimuli, spontaneous but not purposefully moved R side.  GU: deferred  Resolved Hospital Problem list     Assessment & Plan:  Acute sah 2/2 L PICA aneurysm rupture and hydrocephalus: -per neurosx -keppra, nimotop -tcds per Neurosx -evd in place -s/p clipping HA Acute encephalopathy 2/2 above  Acute resp failure:  -2/2 neuro status -intubated for  airway protection -titrate vent  Hypertensive emergency:  -on cleviprex at this time -prn labetalol/hydralazine unless HR <60 -goal sbp <140 -start losartan at this time, awaiting family for medication check  Hypokalemia:  -replaced  Prediabetic/hyperglycemia:  -a1c 6.2  Mediastinal mass:  -f/u based on clinical course Best practice:  Diet: npo but start tf 11/4 Pain/Anxiety/Delirium protocol (if indicated): per protocol VAP protocol (if indicated): per protocol DVT prophylaxis: scd GI prophylaxis: ppi Glucose control: monitoring Mobility: bedrest Code Status: FULL Family Communication: per primary Disposition: ICU  Labs   CBC: Recent Labs  Lab 01/30/20 0427 01/30/20 0427 01/30/20 0753 01/30/20 2028 01/31/20 0011 01/31/20 0113 01/31/20 0450  WBC 10.3  --   --   --   --   --  13.3*  NEUTROABS 7.9*  --   --   --   --   --   --   HGB 13.5   < > 14.6 10.9* 9.9* 9.9* 11.6*  HCT 43.8   < > 43.0 32.0* 29.0* 29.0* 36.7  MCV 82.5  --   --   --   --   --  81.9  PLT 168  --   --   --   --   --  114*   < > = values in this interval not displayed.    Basic Metabolic Panel: Recent Labs  Lab 01/30/20 0427 01/30/20 0427 01/30/20 0753 01/30/20 2028 01/31/20 0011 01/31/20 0113 01/31/20 0450  NA 141   < > 141 140 140 140 141  K 3.4*   < > 3.8 4.5 4.8 5.0 3.9  CL 104  --   --   --   --   --  112*  CO2 27  --   --   --   --   --  15*  GLUCOSE 178*  --   --   --   --   --  248*  BUN 20  --   --   --   --   --  23  CREATININE 0.84  --   --   --   --   --  0.90  CALCIUM 9.6  --   --   --   --   --  8.5*  MG  --   --   --   --   --   --  2.1  PHOS  --   --   --   --   --   --  3.0   < > = values in this interval not displayed.   GFR: Estimated Creatinine Clearance: 67.7 mL/min (by C-G formula based on SCr of 0.9 mg/dL). Recent Labs  Lab 01/30/20 0427 01/31/20 0450  WBC 10.3 13.3*    Liver Function Tests: Recent Labs  Lab 01/31/20 0450  ALBUMIN 3.3*   No  results for input(s): LIPASE, AMYLASE in the last 168 hours. No results for input(s): AMMONIA in the last 168 hours.  ABG    Component Value Date/Time   PHART 7.339 (L) 01/31/2020 0113   PCO2ART 40.4 01/31/2020 0113  PO2ART 231 (H) 01/31/2020 0113   HCO3 22.1 01/31/2020 0113   TCO2 23 01/31/2020 0113   ACIDBASEDEF 4.0 (H) 01/31/2020 0113   O2SAT 100.0 01/31/2020 0113     Coagulation Profile: Recent Labs  Lab 01/30/20 0859  INR 1.0    Cardiac Enzymes: No results for input(s): CKTOTAL, CKMB, CKMBINDEX, TROPONINI in the last 168 hours.  HbA1C: Hgb A1c MFr Bld  Date/Time Value Ref Range Status  01/30/2020 08:59 AM 6.2 (H) 4.8 - 5.6 % Final    Comment:    (NOTE) Pre diabetes:          5.7%-6.4%  Diabetes:              >6.4%  Glycemic control for   <7.0% adults with diabetes     CBG: Recent Labs  Lab 01/30/20 1146 01/30/20 1634 01/31/20 0331  GLUCAP 140* 109* 224*     Critical care time: The patient is critically ill with multiple organ systems failure and requires high complexity decision making for assessment and support, frequent evaluation and titration of therapies, application of advanced monitoring technologies and extensive interpretation of multiple databases.  Critical care time 36 mins. This represents my time independent of the NP's/PA's/med students/residents time taking care of the pt. This is excluding procedures.     [facility] 01/31/2020, 8:18 AM

## 2020-01-31 NOTE — Progress Notes (Signed)
[facility] Progress Note Patient Name: Stacy Moran DOB: 10/11/1955 MRN: [id number]   Date of Service  01/31/2020  HPI/Events of Note  Hypertension - SBP remains = 170/66. Currently on Cleviprex at ceiling dose and Hydralazine IV PRN.  eICU Interventions  Plan: 1. Lasix 40 mg IV X 1 now.      Intervention Category Major Interventions: Hypertension - evaluation and management  Stamatia Masri Eugene 01/31/2020, 6:51 AM

## 2020-01-31 NOTE — Brief Op Note (Signed)
  NEUROSURGERY BRIEF OPERATIVE NOTE   PREOP DX: Subarachnoid Hemorrhage  POSTOP DX: Same  PROCEDURE: Left far-lateral craniotomy for clipping of left PICA aneurysm, complex  SURGEON: Dr. [name], MD  ASSISTANT: Dr. Ashok Pall, MD  ANESTHESIA: GETA  EBL: 500cc  SPECIMENS: None  DRAINS: None  COMPLICATIONS: None immediate  CONDITION: Hemodynamically stable to PACU    [name], MD [facility]

## 2020-02-01 ENCOUNTER — Inpatient Hospital Stay (HOSPITAL_COMMUNITY): Payer: Self-pay

## 2020-02-01 DIAGNOSIS — R079 Chest pain, unspecified: Secondary | ICD-10-CM

## 2020-02-01 DIAGNOSIS — I609 Nontraumatic subarachnoid hemorrhage, unspecified: Secondary | ICD-10-CM

## 2020-02-01 DIAGNOSIS — J9601 Acute respiratory failure with hypoxia: Secondary | ICD-10-CM

## 2020-02-01 DIAGNOSIS — I351 Nonrheumatic aortic (valve) insufficiency: Secondary | ICD-10-CM

## 2020-02-01 LAB — GLUCOSE, CAPILLARY
Glucose-Capillary: 176 mg/dL — ABNORMAL HIGH (ref 70–99)
Glucose-Capillary: 194 mg/dL — ABNORMAL HIGH (ref 70–99)
Glucose-Capillary: 167 mg/dL — ABNORMAL HIGH (ref 70–99)
Glucose-Capillary: 157 mg/dL — ABNORMAL HIGH (ref 70–99)
Glucose-Capillary: 149 mg/dL — ABNORMAL HIGH (ref 70–99)

## 2020-02-01 LAB — CBC
HCT: 30.1 % — ABNORMAL LOW (ref 36.0–46.0)
Hemoglobin: 9.6 g/dL — ABNORMAL LOW (ref 12.0–15.0)
MCH: 26 pg (ref 26.0–34.0)
MCHC: 31.9 g/dL (ref 30.0–36.0)
MCV: 81.6 fL (ref 80.0–100.0)
Platelets: 114 10*3/uL — ABNORMAL LOW (ref 150–400)
RBC: 3.69 MIL/uL — ABNORMAL LOW (ref 3.87–5.11)
RDW: 15.9 % — ABNORMAL HIGH (ref 11.5–15.5)
WBC: 22.7 10*3/uL — ABNORMAL HIGH (ref 4.0–10.5)
nRBC: 0 % (ref 0.0–0.2)

## 2020-02-01 LAB — RENAL FUNCTION PANEL
Albumin: 2.9 g/dL — ABNORMAL LOW (ref 3.5–5.0)
Anion gap: 11 (ref 5–15)
BUN: 25 mg/dL — ABNORMAL HIGH (ref 8–23)
CO2: 17 mmol/L — ABNORMAL LOW (ref 22–32)
Calcium: 8.4 mg/dL — ABNORMAL LOW (ref 8.9–10.3)
Chloride: 112 mmol/L — ABNORMAL HIGH (ref 98–111)
Creatinine, Ser: 0.74 mg/dL (ref 0.44–1.00)
GFR, Estimated: >60 mL/min (ref >60)
Glucose, Bld: 215 mg/dL — ABNORMAL HIGH (ref 70–99)
Phosphorus: 4.2 mg/dL (ref 2.5–4.6)
Potassium: 3.1 mmol/L — ABNORMAL LOW (ref 3.5–5.1)
Sodium: 140 mmol/L (ref 135–145)

## 2020-02-01 LAB — MAGNESIUM
Magnesium: 2.3 mg/dL (ref 1.7–2.4)
Magnesium: 3 mg/dL — ABNORMAL HIGH (ref 1.7–2.4)

## 2020-02-01 LAB — PHOSPHORUS
Phosphorus: 4.3 mg/dL (ref 2.5–4.6)
Phosphorus: 2.8 mg/dL (ref 2.5–4.6)

## 2020-02-01 LAB — ECHOCARDIOGRAM COMPLETE
Area-P 1/2: 5.13 cm2
Height: 69 in
P 1/2 time: 288 msec
S' Lateral: 2.6 cm
Weight: 2560 oz

## 2020-02-01 LAB — TRIGLYCERIDES: Triglycerides: 188 mg/dL — ABNORMAL HIGH (ref <150)

## 2020-02-01 MED ORDER — POTASSIUM CHLORIDE 20 MEQ PO PACK
40.0000 meq | PACK | ORAL | Status: DC
  Administered 2020-02-01: 40 meq
  Filled 2020-02-01: qty 2

## 2020-02-01 MED ORDER — POLYVINYL ALCOHOL 1.4 % OP SOLN
1.0000 [drp] | Freq: Two times a day (BID) | OPHTHALMIC | Status: DC
  Administered 2020-02-01 – 2020-04-22 (×155): 1 [drp] via OPHTHALMIC
  Filled 2020-02-01 (×8): qty 15

## 2020-02-01 MED ORDER — POTASSIUM CHLORIDE 20 MEQ PO PACK
40.0000 meq | PACK | ORAL | Status: AC
  Administered 2020-02-01 (×2): 40 meq
  Filled 2020-02-01 (×2): qty 2

## 2020-02-01 MED ORDER — FUROSEMIDE 10 MG/ML IJ SOLN
40.0000 mg | Freq: Once | INTRAMUSCULAR | Status: AC
  Administered 2020-02-01: 40 mg via INTRAVENOUS
  Filled 2020-02-01: qty 4

## 2020-02-01 NOTE — Progress Notes (Signed)
  Echocardiogram 2D Echocardiogram has been performed.  Stacy Moran 02/01/2020, 11:19 AM

## 2020-02-01 NOTE — Progress Notes (Signed)
OT Cancellation Note  Patient Details Name: Stacy Moran MRN: [id number] DOB: 1955-06-15   Cancelled Treatment:    Reason Eval/Treat Not Completed: Active bedrest order (Pt remains on bed rest, intubated. Will follow as available and appropriate.)  Zenovia Jarred, MSOT, OTR/L Acute Rehabilitation Services [facility] Office Number: [phone] Pager: [phone]   Zenovia Jarred 02/01/2020, 9:05 AM

## 2020-02-01 NOTE — Progress Notes (Signed)
Answered pt's phone and spoke with [place] who has been attempting to get in touch with pt and reports they are a family-- " a cousin and worked together before." [employer] pt is not married but has a daughter who has cancer. Tawni Pummel was not able to give this RN the name or contact info for daughter but states they were going to get in touch with the manager at her job in order to call the emergency contact on file. 4N ICU unit number given to [facility].

## 2020-02-01 NOTE — Progress Notes (Signed)
  NEUROSURGERY PROGRESS NOTE   Pt seen and examined. No issues overnight, somewhat difficult time controlling SBP<140.  EXAM: Temp:  [98.2 F (36.8 C)-99 F (37.2 C)] 99 F (37.2 C) (11/05 0800) Pulse Rate:  [79-93] 84 (11/05 1300) Resp:  [14-26] 19 (11/05 1300) BP: (124-164)/(54-81) 143/65 (11/05 1300) SpO2:  [100 %] 100 % (11/05 1300) Arterial Line BP: (139-199)/(51-69) 180/63 (11/05 1300) FiO2 (%):  [40 %] 40 % (11/05 1044) Intake/Output      11/04 0701 - 11/05 0700 11/05 0701 - 11/06 0700   I.V. (mL/kg) 2211.7 (30.5) 580.6 (8)   NG/GT 8.3 380   IV Piggyback 200.5 100   Total Intake(mL/kg) 2420.5 (33.3) 1060.6 (14.6)   Urine (mL/kg/hr) 1850 (1.1) 1210 (2.5)   Drains 39 20   Other     Blood     Total Output 1889 1230   Net +531.5 -169.5         ? Eye opening to pain Right pupil reactive Breathing spontaneously over vent Move RUE/RLE purposefully Weak w/d LLE, no movement LUE Wound c/d/i EVD in place, functional  LABS: Lab Results  Component Value Date   CREATININE 0.74 02/01/2020   BUN 25 (H) 02/01/2020   NA 140 02/01/2020   K 3.1 (L) 02/01/2020   CL 112 (H) 02/01/2020   CO2 17 (L) 02/01/2020   Lab Results  Component Value Date   WBC 22.7 (H) 02/01/2020   HGB 9.6 (L) 02/01/2020   HCT 30.1 (L) 02/01/2020   MCV 81.6 02/01/2020   PLT 114 (L) 02/01/2020    IMPRESSION: - 64 y.o. female POD#2 SAH# 2 s/p clipping left PICA aneurysm. Appears relatively stable with left hemiparesis UE>LE. Unlikely to be spasm related given timeframe.  PLAN: - Cont supportive care - Can relax SBP up to 161mmHg - TCD today - Cont Nimotop

## 2020-02-01 NOTE — Progress Notes (Addendum)
SLP Cancellation Note  Patient Details Name: Calene Paradiso MRN: [id number] DOB: 1955-12-13   Cancelled treatment:       Reason Eval/Treat Not Completed: Medical issues which prohibited therapy (remains on vent). Will f/u early next week. Please contact our services if they are needed before then.   Osie Bond., M.A. [place] [facility] Services Pager [phone] Office [phone]  02/01/2020, 8:18 AM

## 2020-02-01 NOTE — Progress Notes (Addendum)
NAME:  Stacy Moran, MRN:  [id number], DOB:  Jan 29, 1956, LOS: 0 ADMISSION DATE:  01/30/2020, CONSULTATION DATE:  01/30/2020 REFERRING MD:  Ferne Reus, PA, CHIEF COMPLAINT:  Headache  Brief History   64 yoF presenting with sudden onset of worst headache of her life.  BP 262/123.  CT/ CTA showed large volume SAH secondary to ruptured PICA with resultant hydrocephalus. Progressive decline in mental status in ER requiring intubation for airway protection.  Admitted by NSGY.  PCCM consulted for vent management.  EVD to be placed on arrival to ICU.   History of present illness   HPI obtained from medical chart review as patient is intubated and sedated on mechanical ventilation.   64 year old female with no known prior medical history presenting with sudden onset of the worst headache of her life with associated nausea and vomiting.  Noted to be hypertensive with BP 262/123 in ER.  CT/ CTA head and neck showed large volume SAH secondary to ruptured PICA aneurysm with resultant hydrocephalus.  She was started on cleviprex and treated with fentanyl, however she had progressive decline in mental status, despite narcan, requiring intubation for airway protection.  Admitted by neurosurgery.  Plans for EVD on arrival to ICU and diagnostic angiogram with possible coiling today.  PCCM consulted for ventilator management.   Past Medical History  No known past medical history   Significant Hospital Events   11/3 Admitted  Consults:  PCCM  Procedures:  11/3 ETT >> 11/3 EVD >>  Significant Diagnostic Tests:  11/3 CTH > 1. 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 2. Intraventricular reflux with hydrocephalus. 3. Chronic small vessel disease. 11/3 CTA head/ neck > 1. Ruptured 3 mm left PICA aneurysm. 2. The left vertebral artery is dominant and diffusely patent. Moderate right V4 segment stenosis. 3. Incidental partially covered  mass in the middle mediastinum which mildly deforms the esophagus and could be intra or extramural. After recovery, recommend chest CT with contrast. 4. No stenosis or significant atherosclerosis in the neck. 5. 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  Micro Data:  11/3 SARS2/ Flu > neg  Antimicrobials:  n/a  Interim history/subjective:  11/3: Cleviprex at 21 mg/hr, requiring labetalol prn for SBP goal < 140 11/4: remains on max cleviprex, prn meds with still increased SBP. Will add more home meds to help with BP control, goal remains <140. S/p failed coil and req crani for clipping.   Objective   Blood pressure (!) 158/63, pulse 91, temperature 98.2 F (36.8 C), temperature source Oral, resp. rate 15, height 5\' 9"  (1.753 m), weight 72.6 kg, SpO2 100 %.    Vent Mode: CPAP;PSV FiO2 (%):  [40 %] 40 % Set Rate:  [16 bmp] 16 bmp Vt Set:  [520 mL-530 mL] 520 mL PEEP:  [5 cmH20] 5 cmH20 Pressure Support:  [12 cmH20] 12 cmH20 Plateau Pressure:  [15 cmH20-17 cmH20] 15 cmH20   Intake/Output Summary (Last 24 hours) at 02/01/2020 0914 Last data filed at 02/01/2020 0900 Gross per 24 hour  Intake 2398.95 ml  Output 1865 ml  Net 533.95 ml   Filed Weights   01/30/20 0657  Weight: 72.6 kg    Examination: General: adult female, on vent  HEENT: ETT/OG in place  Lungs: clear breath sounds, vent assisted breaths  Cardiovascular: RRR, no MRG  Abdomen: soft, non-distended, active bowel sounds  Extremities: -edema  Skin: intact  Neuro: prosthetic left eye, right eye  with pin point pupils, withdrawals from pain on right, flicker noted to left lower, flaccid to left upper  GU: intact   Resolved Hospital Problem list     Assessment & Plan:   Acute sah 2/2 L PICA aneurysm rupture and hydrocephalus s/p Clipping/Crani 11/3 Acute encephalopathy 2/2 above Plan -per neurosx -keppra, nimotop -tcds per Neurosx -evd in place  Acute resp failure 2/2 neuro  status Plan -Vent Support, Neuro status barrier to extubation  -VAP Prevention Bundle   Hypertensive emergency:  Plan -on cleviprex at this time (21 mg/hr)  -prn labetalol/hydralazine unless HR <60 (received 10 mg x 2 hydralazine overnight, and 20 mg labetalol)  -goal sbp <160  -Continue losartan   Hypokalemia:  Plan -Trend BMP > Replacing now   Prediabetic/hyperglycemia:  a1c 6.2 Plan -Trend Glucose, SSI   Mediastinal mass:  Plan -f/u based on clinical course  Best practice:  Diet: TF  Pain/Anxiety/Delirium protocol (if indicated): per protocol VAP protocol (if indicated): per protocol DVT prophylaxis: scd GI prophylaxis: ppi Glucose control: monitoring Mobility: bedrest Code Status: FULL Family Communication: per primary Disposition: ICU  Labs   CBC: Recent Labs  Lab 01/30/20 0427 01/30/20 0753 01/30/20 2028 01/31/20 0011 01/31/20 0113 01/31/20 0450 02/01/20 0554  WBC 10.3  --   --   --   --  13.3* 22.7*  NEUTROABS 7.9*  --   --   --   --   --   --   HGB 13.5   < > 10.9* 9.9* 9.9* 11.6* 9.6*  HCT 43.8   < > 32.0* 29.0* 29.0* 36.7 30.1*  MCV 82.5  --   --   --   --  81.9 81.6  PLT 168  --   --   --   --  114* 114*   < > = values in this interval not displayed.    Basic Metabolic Panel: Recent Labs  Lab 01/30/20 0427 01/30/20 0753 01/30/20 2028 01/31/20 0011 01/31/20 0113 01/31/20 0450 01/31/20 1710 02/01/20 0554  NA 141   < > 140 140 140 141  --  140  K 3.4*   < > 4.5 4.8 5.0 3.9  --  3.1*  CL 104  --   --   --   --  112*  --  112*  CO2 27  --   --   --   --  15*  --  17*  GLUCOSE 178*  --   --   --   --  248*  --  215*  BUN 20  --   --   --   --  23  --  25*  CREATININE 0.84  --   --   --   --  0.90  --  0.74  CALCIUM 9.6  --   --   --   --  8.5*  --  8.4*  MG  --   --   --   --   --  2.1 2.1 2.3  PHOS  --   --   --   --   --  3.0 3.7 4.2  4.3   < > = values in this interval not displayed.   GFR: Estimated Creatinine Clearance: 76.2  mL/min (by C-G formula based on SCr of 0.74 mg/dL). Recent Labs  Lab 01/30/20 0427 01/31/20 0450 02/01/20 0554  WBC 10.3 13.3* 22.7*    Liver Function Tests: Recent Labs  Lab 01/31/20 0450 02/01/20 0554  ALBUMIN 3.3* 2.9*  No results for input(s): LIPASE, AMYLASE in the last 168 hours. No results for input(s): AMMONIA in the last 168 hours.  ABG    Component Value Date/Time   PHART 7.339 (L) 01/31/2020 0113   PCO2ART 40.4 01/31/2020 0113   PO2ART 231 (H) 01/31/2020 0113   HCO3 22.1 01/31/2020 0113   TCO2 23 01/31/2020 0113   ACIDBASEDEF 4.0 (H) 01/31/2020 0113   O2SAT 100.0 01/31/2020 0113     Coagulation Profile: Recent Labs  Lab 01/30/20 0859  INR 1.0    Cardiac Enzymes: No results for input(s): CKTOTAL, CKMB, CKMBINDEX, TROPONINI in the last 168 hours.  HbA1C: Hgb A1c MFr Bld  Date/Time Value Ref Range Status  01/30/2020 08:59 AM 6.2 (H) 4.8 - 5.6 % Final    Comment:    (NOTE) Pre diabetes:          5.7%-6.4%  Diabetes:              >6.4%  Glycemic control for   <7.0% adults with diabetes     CBG: Recent Labs  Lab 01/31/20 1634 01/31/20 1938 01/31/20 2328 02/01/20 0339 02/01/20 0749  GLUCAP 200* 154* 130* 176* 194*     Critical care time: The patient is critically ill with multiple organ systems failure and requires high complexity decision making for assessment and support, frequent evaluation and titration of therapies, application of advanced monitoring technologies and extensive interpretation of multiple databases.  Critical care time 32 mins. This represents my time independent of the NP's/PA's/med students/residents time taking care of the pt. This is excluding procedures.     Hayden Pedro, AGACNP-BC [facility]  Pgr: [phone]  PCCM Pgr: [phone]

## 2020-02-01 NOTE — Progress Notes (Signed)
[facility] Progress Note Patient Name: Stacy Moran DOB: 1956-01-16 MRN: [id number]   Date of Service  02/01/2020  HPI/Events of Note  Hypertension - BP = 148/63. Goal SBP < 140. Currently on Cleviprex IV infusion at ceiling dose + Hydralazine IV PRN + Labetalol IV PRN + Nimodipine PO.  eICU Interventions  Plan: 1. Lasix 40 mg IV X 1 now.      Intervention Category Major Interventions: Hypertension - evaluation and management  [name] 02/01/2020, 6:50 AM

## 2020-02-01 NOTE — Progress Notes (Signed)
PT Cancellation Note  Patient Details Name: Stacy Moran MRN: [id number] DOB: 16-Apr-1955   Cancelled Treatment:    Reason Eval/Treat Not Completed: Medical issues which prohibited therapy;Active bedrest order. Pt remains on bed rest, intubated. PT will continue to follow and evaluate as appropriate.   Hardie Pulley, DPT   Acute Rehabilitation Department Pager #: [phone]   Otho Bellows 02/01/2020, 9:29 AM

## 2020-02-01 NOTE — Progress Notes (Addendum)
Transcranial Doppler  Date POD PCO2 HCT BP  MCA ACA PCA OPHT SIPH VERT Basilar  11/4 RH     Right  Left   *  *   *  *   *  *   20  16   86  23   *  21   *      11/5,rs     Right  Left   *  *   *  *   *  *   23  19   99  28   *     *           Right  Left                                             Right  Left                                             Right  Left                                            Right  Left                                            Right  Left                                        MCA = Middle Cerebral Artery      OPHT = Opthalmic Artery     BASILAR = Basilar Artery   ACA = Anterior Cerebral Artery     SIPH = Carotid Siphon PCA = Posterior Cerebral Artery   VERT = Verterbral Artery                 *- Unable to insonate   Normal MCA = 62+\-12 ACA = 50+\-12 PCA = 42+\-23   Darlin Coco, RDMS  02/01/20 - (*) unable to insonate - poor windows. Oda Cogan, BS, RDMS, RVT

## 2020-02-02 DIAGNOSIS — J9601 Acute respiratory failure with hypoxia: Secondary | ICD-10-CM

## 2020-02-02 LAB — TYPE AND SCREEN
ABO/RH(D): A POS
Antibody Screen: NEGATIVE
Unit division: 0
Unit division: 0
Unit division: 0
Unit division: 0

## 2020-02-02 LAB — MAGNESIUM
Magnesium: 2.7 mg/dL — ABNORMAL HIGH (ref 1.7–2.4)
Magnesium: 2.9 mg/dL — ABNORMAL HIGH (ref 1.7–2.4)

## 2020-02-02 LAB — RENAL FUNCTION PANEL
Albumin: 2.7 g/dL — ABNORMAL LOW (ref 3.5–5.0)
Anion gap: 7 (ref 5–15)
BUN: 23 mg/dL (ref 8–23)
CO2: 21 mmol/L — ABNORMAL LOW (ref 22–32)
Calcium: 8.8 mg/dL — ABNORMAL LOW (ref 8.9–10.3)
Chloride: 111 mmol/L (ref 98–111)
Creatinine, Ser: 0.61 mg/dL (ref 0.44–1.00)
GFR, Estimated: >60 mL/min (ref >60)
Glucose, Bld: 193 mg/dL — ABNORMAL HIGH (ref 70–99)
Phosphorus: 2.3 mg/dL — ABNORMAL LOW (ref 2.5–4.6)
Potassium: 4.3 mmol/L (ref 3.5–5.1)
Sodium: 139 mmol/L (ref 135–145)

## 2020-02-02 LAB — CBC
HCT: 28.2 % — ABNORMAL LOW (ref 36.0–46.0)
Hemoglobin: 9.9 g/dL — ABNORMAL LOW (ref 12.0–15.0)
MCH: 28.9 pg (ref 26.0–34.0)
MCHC: 35.1 g/dL (ref 30.0–36.0)
MCV: 82.5 fL (ref 80.0–100.0)
Platelets: 113 10*3/uL — ABNORMAL LOW (ref 150–400)
RBC: 3.42 MIL/uL — ABNORMAL LOW (ref 3.87–5.11)
RDW: 16.6 % — ABNORMAL HIGH (ref 11.5–15.5)
WBC: 19.7 10*3/uL — ABNORMAL HIGH (ref 4.0–10.5)
nRBC: 0 % (ref 0.0–0.2)

## 2020-02-02 LAB — BPAM RBC
Blood Product Expiration Date: 202111242359
Blood Product Expiration Date: 202111242359
Blood Product Expiration Date: 202111242359
Blood Product Expiration Date: 202111242359
ISSUE DATE / TIME: 202111032116
ISSUE DATE / TIME: 202111032116
Unit Type and Rh: 6200
Unit Type and Rh: 6200
Unit Type and Rh: 6200
Unit Type and Rh: 6200

## 2020-02-02 LAB — GLUCOSE, CAPILLARY
Glucose-Capillary: 150 mg/dL — ABNORMAL HIGH (ref 70–99)
Glucose-Capillary: 151 mg/dL — ABNORMAL HIGH (ref 70–99)
Glucose-Capillary: 154 mg/dL — ABNORMAL HIGH (ref 70–99)
Glucose-Capillary: 158 mg/dL — ABNORMAL HIGH (ref 70–99)
Glucose-Capillary: 161 mg/dL — ABNORMAL HIGH (ref 70–99)
Glucose-Capillary: 174 mg/dL — ABNORMAL HIGH (ref 70–99)
Glucose-Capillary: 226 mg/dL — ABNORMAL HIGH (ref 70–99)

## 2020-02-02 MED ORDER — MAGNESIUM SULFATE 4 GM/100ML IV SOLN
4.0000 g | Freq: Once | INTRAVENOUS | Status: DC
  Filled 2020-02-02: qty 100

## 2020-02-02 MED ORDER — POTASSIUM PHOSPHATES 15 MMOLE/5ML IV SOLN
30.0000 mmol | Freq: Once | INTRAVENOUS | Status: AC
  Administered 2020-02-02: 30 mmol via INTRAVENOUS
  Filled 2020-02-02: qty 10

## 2020-02-02 MED ORDER — FENTANYL CITRATE (PF) 100 MCG/2ML IJ SOLN
50.0000 ug | Freq: Four times a day (QID) | INTRAMUSCULAR | Status: DC | PRN
  Administered 2020-02-02 – 2020-02-08 (×11): 50 ug via INTRAVENOUS
  Filled 2020-02-02 (×12): qty 2

## 2020-02-02 MED ORDER — POTASSIUM CHLORIDE 20 MEQ/15ML (10%) PO SOLN
40.0000 meq | Freq: Once | ORAL | Status: DC
  Filled 2020-02-02: qty 30

## 2020-02-02 MED ORDER — CALCIUM GLUCONATE-NACL 2-0.675 GM/100ML-% IV SOLN
2.0000 g | Freq: Once | INTRAVENOUS | Status: DC
  Filled 2020-02-02: qty 100

## 2020-02-02 MED ORDER — POTASSIUM CHLORIDE 10 MEQ/100ML IV SOLN: 10.0000 meq | INTRAVENOUS | Status: DC

## 2020-02-02 MED ORDER — MAGNESIUM SULFATE 2 GM/50ML IV SOLN: 2.0000 g | Freq: Once | INTRAVENOUS | Status: DC

## 2020-02-02 NOTE — Progress Notes (Signed)
Neurosurgery Service Progress Note  Subjective: No acute events overnight   Objective: Vitals:   02/02/20 0800 02/02/20 0801 02/02/20 0900 02/02/20 1000  BP: (!) 149/68 (!) 149/68 (!) 149/68 (!) 158/67  Pulse: 83 81 85 88  Resp: 13 14 18 18   Temp: 98.8 F (37.1 C)     TempSrc: Axillary     SpO2: 100% 100% 100% 100%  Weight:      Height:        Physical Exam: R eye open to painful stim, L pupil fixed, possible L APD, corneal surface changes with iris discoloration (reportedly baseline from prior surgery), R eye deviated upwards and laterally, corneals intact OU, no cough to deep suctioning but does move R side w/ suctioning, w/d on R, 0/5 on L  Assessment & Plan: 64 y.o. woman w/ ruptured PICA s/p clipping, high grade.  -cont gtt OD, can tape that eye shut to protect cornea, may need tarsorrhaphy if she survives her aSAH -cont EVD, output appropriate  Judith Part  02/02/20 10:28 AM

## 2020-02-02 NOTE — Evaluation (Signed)
Physical Therapy Evaluation Patient Details Name: Stacy Moran MRN: [id number] DOB: 1955/05/28 Today's Date: 02/02/2020   History of Present Illness  pt is a 64 y/o female with no known PHX admitted with sudden onset of HA, BP 262/123, Imaging showing large volume SAHdue to a ruptured L PICA aneurysm associated with intraventricular hydrocephalus.  11/4, pt s/p L far-lateral craniotomy for clipping of PICA aneurysm.  Clinical Impression  Pt admitted with/for ruptured aneurysm as stated above.  This was a limited evaluation due to low arousal level of patient.  Pt currently limited functionally due to the problems listed. ( See problems list.)   Pt will benefit from PT to maximize function and safety in order to get ready for next venue listed below.     Follow Up Recommendations SNF;Other (comment) (with work toward being appropriate for [facility])    Equipment Recommendations  Other (comment) (TBA)    Recommendations for Other Services       Precautions / Restrictions Precautions Precautions: Fall Precaution Comments: EVD in place      Mobility  Bed Mobility               General bed mobility comments: use be egress function to sit pt up in bed, pt did not become significantly more awake    Transfers                 General transfer comment: not appropriate  Ambulation/Gait                Stairs            Wheelchair Mobility    Modified Rankin (Stroke Patients Only) Modified Rankin (Stroke Patients Only) Pre-Morbid Rankin Score: No symptoms Modified Rankin: Severe disability     Balance                                             Pertinent Vitals/Pain Pain Assessment: Faces Faces Pain Scale: No hurt Pain Intervention(s): Monitored during session    Home Living                   Additional Comments: No family present to get info.  Pt unable.    Prior Function           Comments: No family present to  gather information, pt unable to participate, no information in the chart.     Hand Dominance        Extremity/Trunk Assessment   Upper Extremity Assessment Upper Extremity Assessment: RUE deficits/detail;LUE deficits/detail RUE Deficits / Details: 11/6, noted spontaneous movement with wave of forearm and hand LUE Deficits / Details: no spontanous movement noted, no tone    Lower Extremity Assessment Lower Extremity Assessment: RLE deficits/detail;LLE deficits/detail RLE Deficits / Details: no spontaneous or volitional movement noted, where others have seen spontaneous movment LLE Deficits / Details: no spontaneous or volitional movement noted to any stimuli       Communication   Communication: Other (comment) (intubated via ETT)  Cognition Arousal/Alertness: Lethargic;Suspect due to medications Behavior During Therapy: Flat affect Overall Cognitive Status: Difficult to assess                                 General Comments: RN report pt following simple commands early in the day  General Comments General comments (skin integrity, edema, etc.): Vitals- with vent on 40% FiO2-- HR 100 bpm  sats 100%, RR 16, BP 159/71.  pt needed mild noxious stim to get a low arousal state.  Pt would flicker her R eye open calling out her name"Stacy Moran"   There was no localization response to any stimuli even sitting upright in egress position.    Exercises Other Exercises Other Exercises: warm up PROM   Assessment/Plan    PT Assessment Patient needs continued PT services  PT Problem List Decreased strength;Decreased activity tolerance;Decreased mobility;Decreased cognition;Decreased safety awareness       PT Treatment Interventions Functional mobility training;Therapeutic activities;Balance training;Neuromuscular re-education;Patient/family education    PT Goals (Current goals can be found in the Care Plan section)  Acute Rehab PT Goals PT Goal Formulation: Patient  unable to participate in goal setting Time For Goal Achievement: 02/16/20 Potential to Achieve Goals: Fair    Frequency Min 3X/week   Barriers to discharge        Co-evaluation               AM-PAC PT "6 Clicks" Mobility  Outcome Measure Help needed turning from your back to your side while in a flat bed without using bedrails?: Total Help needed moving from lying on your back to sitting on the side of a flat bed without using bedrails?: Total Help needed moving to and from a bed to a chair (including a wheelchair)?: Total Help needed standing up from a chair using your arms (e.g., wheelchair or bedside chair)?: Total Help needed to walk in hospital room?: Total Help needed climbing 3-5 steps with a railing? : Total 6 Click Score: 6    End of Session Equipment Utilized During Treatment: Oxygen Activity Tolerance: Patient tolerated treatment well Patient left: in bed;with call bell/phone within reach;with bed alarm set;with SCD's reapplied;with restraints reapplied Nurse Communication: Mobility status PT Visit Diagnosis: Hemiplegia and hemiparesis;Other symptoms and signs involving the nervous system (R29.898);Other abnormalities of gait and mobility (R26.89) Hemiplegia - Right/Left: Left Hemiplegia - dominant/non-dominant: Non-dominant Hemiplegia - caused by: Other Nontraumatic intracranial hemorrhage    Time: 1511-1531 PT Time Calculation (min) (ACUTE ONLY): 20 min   Charges:   PT Evaluation $PT Eval High Complexity: 1 High          02/02/2020  [name], PT Acute Rehabilitation Services [phone]  (pager) [phone]  (office)  Tessie Fass Lahari Suttles 02/02/2020, 5:08 PM

## 2020-02-02 NOTE — Progress Notes (Signed)
NAME:  Stacy Moran, MRN:  [id number], DOB:  1955/10/19, LOS: 0 ADMISSION DATE:  01/30/2020, CONSULTATION DATE:  01/30/2020 REFERRING MD:  Ferne Reus, PA, CHIEF COMPLAINT:  Headache  Brief History   44 yoF presenting with sudden onset of worst headache of her life.  BP 262/123.  CT/ CTA showed large volume SAH secondary to ruptured PICA with resultant hydrocephalus. Progressive decline in mental status in ER requiring intubation for airway protection.    Past Medical History  No known past medical history   Significant Hospital Events   11/3 Admitted  Consults:  PCCM  Procedures:  11/3 ETT >> 11/3 EVD >>  Significant Diagnostic Tests:  11/3 CTH > 1. 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 2. Intraventricular reflux with hydrocephalus. 3. Chronic small vessel disease. 11/3 CTA head/ neck > 1. Ruptured 3 mm left PICA aneurysm. 2. The left vertebral artery is dominant and diffusely patent. Moderate right V4 segment stenosis. 3. 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 After recovery, recommend chest CT with contrast. 4. No stenosis or significant atherosclerosis in the neck. 5. 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  Micro Data:  11/3 SARS2/ Flu > neg  Antimicrobials:  n/a  Interim history/subjective:  Patient tolerated pressure support trial yesterday but her mental status is still poor even after stopping sedation, extubation was deferred.  This morning she is placed again on pressure support trial and has been tolerating well  Objective   Blood pressure (!) 154/69, pulse 91, temperature 98.8 F (37.1 C), temperature source Axillary, resp. rate 20, height 5\' 9"  (1.753 m), weight 66.9 kg, SpO2 100 %.    Vent Mode: PSV;CPAP FiO2 (%):  [40 %] 40 % Set Rate:  [16 bmp] 16 bmp Vt Set:  [520  mL] 520 mL PEEP:  [5 cmH20] 5 cmH20 Pressure Support:  [8 cmH20] 8 cmH20 Plateau Pressure:  [12 cmH20-16 cmH20] 12 cmH20   Intake/Output Summary (Last 24 hours) at 02/02/2020 1217 Last data filed at 02/02/2020 1200 Gross per 24 hour  Intake 1482.28 ml  Output 1756 ml  Net -273.72 ml   Filed Weights   01/30/20 0657 02/02/20 0400  Weight: 72.6 kg 66.9 kg    Examination: General: Middle-aged African-American female, lying in the bed, orally intubated HEENT: EVD in place, moist mucous membranes ETT and OGT in place Lungs: Reduced air entry at the bases, no wheezing or rhonchi  Cardiovascular: RRR, no MRG  Abdomen: soft, non-distended, active bowel sounds  Skin: intact, no rash Neuro: prosthetic left eye, right eye with pupil 3 mm, reactive to light.  Power is intact on right upper extremity, localizing to right lower extremity.  Left side is plegic with intact sensation   Resolved Hospital Problem list   Hypokalemia  Assessment & Plan:  H/H 3, mF4 aneurysmal subarachnoid hemorrhage due to ruptured left PICA aneurysm status post clipping.  Complicated with hydrocephalus status post EVD Acute encephalopathy due to subarachnoid hemorrhage  Continue neuro checks every hour Appreciate neurosurgery input EVD in place at level 0, draining well Continue nimodipine Maintain euvolemia and normal natremia Daily TCD's Avoid sedation  Acute respiratory insufficiency due to subarachnoid hemorrhage Patient is on pressure support trial Avoid sedation with RASS goal 0/-1 We will try to extubate her today  Hypertensive emergency:  Patient is off Cleviprex infusion Continue losartan and as needed labetalol SBP  goal is 100 -160    Prediabetic with hyperglycemia:  a1c 6.2 Continue sliding scale insulin with goal fingerstick glucose 140-180  Mediastinal mass:  Follow-up as an outpatient  Best practice:  Diet: TF  Pain/Anxiety/Delirium protocol (if indicated): As needed propofol VAP  protocol (if indicated): Yes DVT prophylaxis: scd GI prophylaxis: ppi Glucose control: monitoring Mobility: Bedrest until EVD is in place Code Status: FULL Family Communication: Per primary team Disposition: ICU  Labs   CBC: Recent Labs  Lab 01/30/20 0427 01/30/20 0753 01/31/20 0011 01/31/20 0113 01/31/20 0450 02/01/20 0554 02/02/20 0421  WBC 10.3  --   --   --  13.3* 22.7* 19.7*  NEUTROABS 7.9*  --   --   --   --   --   --   HGB 13.5   < > 9.9* 9.9* 11.6* 9.6* 9.9*  HCT 43.8   < > 29.0* 29.0* 36.7 30.1* 28.2*  MCV 82.5  --   --   --  81.9 81.6 82.5  PLT 168  --   --   --  114* 114* 113*   < > = values in this interval not displayed.    Basic Metabolic Panel: Recent Labs  Lab 01/30/20 0427 01/30/20 0753 01/31/20 0011 01/31/20 0113 01/31/20 0450 01/31/20 1710 02/01/20 0554 02/01/20 1701 02/02/20 0424  NA 141   < > 140 140 141  --  140  --  139  K 3.4*   < > 4.8 5.0 3.9  --  3.1*  --  4.3  CL 104  --   --   --  112*  --  112*  --  111  CO2 27  --   --   --  15*  --  17*  --  21*  GLUCOSE 178*  --   --   --  248*  --  215*  --  193*  BUN 20  --   --   --  23  --  25*  --  23  CREATININE 0.84  --   --   --  0.90  --  0.74  --  0.61  CALCIUM 9.6  --   --   --  8.5*  --  8.4*  --  8.8*  MG  --   --   --   --  2.1 2.1 2.3 3.0* 2.7*  PHOS  --   --   --   --  3.0 3.7 4.2  4.3 2.8 2.3*   < > = values in this interval not displayed.   GFR: Estimated Creatinine Clearance: 76.2 mL/min (by C-G formula based on SCr of 0.61 mg/dL). Recent Labs  Lab 01/30/20 0427 01/31/20 0450 02/01/20 0554 02/02/20 0421  WBC 10.3 13.3* 22.7* 19.7*    Liver Function Tests: Recent Labs  Lab 01/31/20 0450 02/01/20 0554 02/02/20 0424  ALBUMIN 3.3* 2.9* 2.7*   No results for input(s): LIPASE, AMYLASE in the last 168 hours. No results for input(s): AMMONIA in the last 168 hours.  ABG    Component Value Date/Time   PHART 7.339 (L) 01/31/2020 0113   PCO2ART 40.4 01/31/2020 0113    PO2ART 231 (H) 01/31/2020 0113   HCO3 22.1 01/31/2020 0113   TCO2 23 01/31/2020 0113   ACIDBASEDEF 4.0 (H) 01/31/2020 0113   O2SAT 100.0 01/31/2020 0113     Coagulation Profile: Recent Labs  Lab 01/30/20 0859  INR 1.0    Cardiac Enzymes: No results for input(s): CKTOTAL, CKMB,  CKMBINDEX, TROPONINI in the last 168 hours.  HbA1C: Hgb A1c MFr Bld  Date/Time Value Ref Range Status  01/30/2020 08:59 AM 6.2 (H) 4.8 - 5.6 % Final    Comment:    (NOTE) Pre diabetes:          5.7%-6.4%  Diabetes:              >6.4%  Glycemic control for   <7.0% adults with diabetes     CBG: Recent Labs  Lab 02/01/20 1948 02/02/20 0046 02/02/20 0332 02/02/20 0735 02/02/20 1125  GLUCAP 149* 226* 174* 161* 158*    Total critical care time: 42 minutes  Performed by: [place] care time was exclusive of separately billable procedures and treating other patients.   Critical care was necessary to treat or prevent imminent or life-threatening deterioration.   Critical care was time spent personally by me on the following activities: development of treatment plan with patient and/or surrogate as well as nursing, discussions with consultants, evaluation of patient's response to treatment, examination of patient, obtaining history from patient or surrogate, ordering and performing treatments and interventions, ordering and review of laboratory studies, ordering and review of radiographic studies, pulse oximetry and re-evaluation of patient's condition.   Jacky Kindle MD Critical care physician [facility]  Pager: [phone] Mobile: [phone]

## 2020-02-02 NOTE — Progress Notes (Deleted)
[facility] Progress Note Patient Name: Stacy Moran DOB: 09/16/1955 MRN: [id number]   Date of Service  02/02/2020  HPI/Events of Note  K+ 2.5, Mg+ 1.1, Ca++ < 0.85 ABG: PH 7.15, PCO2 53  eICU Interventions  Electrolytes replaced per protocol, respiratory rate increased from 18 to 22.        Kerry Kass Kean Gautreau 02/02/2020, 6:45 AM

## 2020-02-03 ENCOUNTER — Inpatient Hospital Stay (HOSPITAL_COMMUNITY): Payer: Self-pay

## 2020-02-03 LAB — GLUCOSE, CAPILLARY
Glucose-Capillary: 121 mg/dL — ABNORMAL HIGH (ref 70–99)
Glucose-Capillary: 123 mg/dL — ABNORMAL HIGH (ref 70–99)
Glucose-Capillary: 129 mg/dL — ABNORMAL HIGH (ref 70–99)
Glucose-Capillary: 142 mg/dL — ABNORMAL HIGH (ref 70–99)
Glucose-Capillary: 151 mg/dL — ABNORMAL HIGH (ref 70–99)
Glucose-Capillary: 152 mg/dL — ABNORMAL HIGH (ref 70–99)

## 2020-02-03 LAB — CALCIUM, IONIZED
Calcium, Ionized, Serum: 5.3 mg/dL (ref 4.5–5.6)
Calcium, Ionized, Serum: 5.4 mg/dL (ref 4.5–5.6)

## 2020-02-03 IMAGING — DX DG ABDOMEN 1V
1 series · 1 of 1 positions shown · non-contrast
Comparison: None.

CLINICAL DATA: OG tube placement

EXAM:
ABDOMEN - 1 VIEW

[abdomen kub]
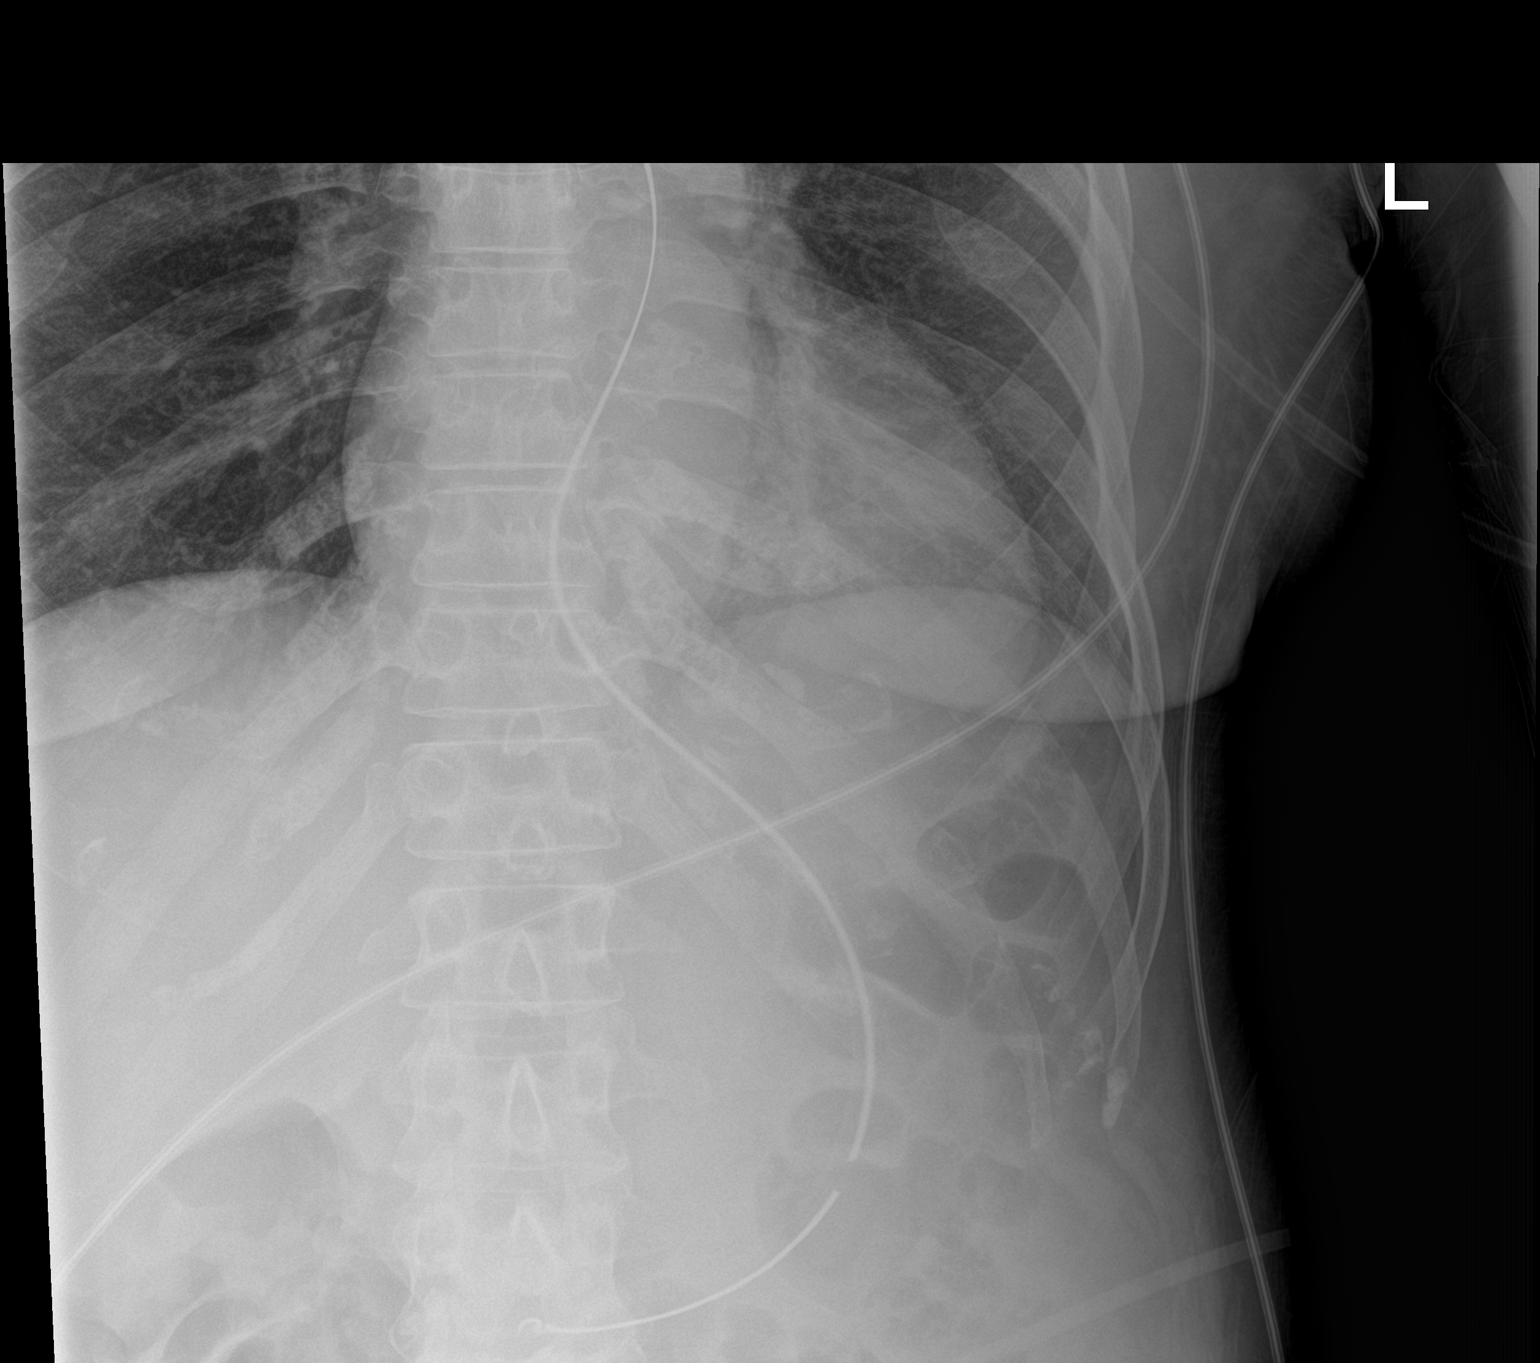

[1 of 1 positions shown; findings below may reference images not displayed]

FINDINGS: The tip of the OG tube projects over the gastric body. The tip is
pointed distally. The visualized bowel gas pattern is unremarkable.
IMPRESSION: OG tube projects over the stomach.

## 2020-02-03 MED ORDER — CLONIDINE HCL 0.1 MG PO TABS
0.1000 mg | ORAL_TABLET | Freq: Two times a day (BID) | ORAL | Status: DC
  Administered 2020-02-03 – 2020-02-05 (×6): 0.1 mg
  Filled 2020-02-03 (×7): qty 1

## 2020-02-03 MED ORDER — LACTULOSE 10 GM/15ML PO SOLN
20.0000 g | Freq: Four times a day (QID) | ORAL | Status: DC
  Administered 2020-02-03 – 2020-02-06 (×8): 20 g
  Filled 2020-02-03 (×8): qty 30

## 2020-02-03 MED ORDER — AMLODIPINE BESYLATE 10 MG PO TABS
10.0000 mg | ORAL_TABLET | Freq: Every day | ORAL | Status: DC
  Administered 2020-02-03 – 2020-04-21 (×72): 10 mg
  Filled 2020-02-03 (×74): qty 1

## 2020-02-03 NOTE — Progress Notes (Signed)
[facility] Progress Note Patient Name: Jaydy Fitzhenry DOB: 16-Oct-1955 MRN: [id number]   Date of Service  02/03/2020  HPI/Events of Note  Patient needs renewal or order for right wrist restraint.  eICU Interventions  Restraint order renewed.        Kerry Kass Swan Zayed 02/03/2020, 10:10 PM

## 2020-02-03 NOTE — Progress Notes (Signed)
Neurosurgery Service Progress Note  Subjective: No acute events overnight   Objective: Vitals:   02/03/20 0933 02/03/20 0945 02/03/20 1000 02/03/20 1015  BP: (!) 144/76 (!) 141/73 (!) 151/73 (!) 146/74  Pulse:  84 86 83  Resp:  15 16 16   Temp:      TempSrc:      SpO2:  100% 100% 100%  Weight:      Height:        Physical Exam: R eye open to painful stim, L pupil fixed, corneal surface changes with iris discoloration (reportedly baseline from prior surgery), R eye deviated upwards and laterally, corneals intact OU, no cough to deep suctioning but does move R side w/ suctioning, w/d on R, 0/5 on L  Assessment & Plan: 63 y.o. woman w/ ruptured PICA s/p clipping, high grade.  -cont gtt OD, can tape that eye shut to protect cornea, may need tarsorrhaphy if she survives her aSAH -EVD to +10 -she is subjectively more somnolent today, but otherwise stable exam, will drop her EVD and liberalize her pressures to SBP<200. Given her high grade at presentation and poor exam post-op before she was in the spasm window, I do not think she would benefit from aggressive endovascular treatment.   Joyice Faster Jazminn Pomales  02/03/20 10:35 AM

## 2020-02-03 NOTE — Progress Notes (Signed)
NAME:  Stacy Moran, MRN:  [id number], DOB:  [birth year], LOS: 0 ADMISSION DATE:  01/30/2020, CONSULTATION DATE:  01/30/2020 REFERRING MD:  Ferne Reus, PA, CHIEF COMPLAINT:  Headache  Brief History   60 yoF presenting with sudden onset of worst headache of her life.  BP 262/123.  CT/ CTA showed large volume SAH secondary to ruptured PICA with resultant hydrocephalus. Progressive decline in mental status in ER requiring intubation for airway protection.    Past Medical History  No known past medical history   Significant Hospital Events   11/3 Admitted  Consults:  PCCM  Procedures:  11/3 ETT >> 11/3 EVD >>  Significant Diagnostic Tests:  11/3 CTH > 1. 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 2. Intraventricular reflux with hydrocephalus. 3. Chronic small vessel disease. 11/3 CTA head/ neck > 1. Ruptured 3 mm left PICA aneurysm. 2. The left vertebral artery is dominant and diffusely patent. Moderate right V4 segment stenosis. 3. 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 After recovery, recommend chest CT with contrast. 4. No stenosis or significant atherosclerosis in the neck. 5. 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  Micro Data:  11/3 SARS2/ Flu > neg  Antimicrobials:  n/a  Interim history/subjective:  Patient is more lethargic today, she did receive 100 mics of fentanyl couple hours ago during bath.  Per nurses patient was moving her right side prior to getting fentanyl.  Objective   Blood pressure (!) 149/73, pulse 84, temperature 98.5 F (36.9 C), resp. rate 16, height 5\' 9"  (1.753 m), weight 66.9 kg, SpO2 100 %.    Vent Mode: PRVC FiO2 (%):  [40 %] 40 % Set Rate:  [16 bmp] 16 bmp Vt Set:  [520 mL] 520 mL PEEP:  [5 cmH20] 5 cmH20 Pressure Support:  [8 cmH20] 8 cmH20 Plateau  Pressure:  [16 cmH20] 16 cmH20   Intake/Output Summary (Last 24 hours) at 02/03/2020 1122 Last data filed at 02/03/2020 1000 Gross per 24 hour  Intake 2098.17 ml  Output 1193 ml  Net 905.17 ml   Filed Weights   01/30/20 0657 02/02/20 0400 02/03/20 0500  Weight: 72.6 kg 66.9 kg 66.9 kg    Examination: General: Middle-aged African-American female, lying in the bed, orally intubated HEENT: EVD in place, moist mucous membranes ETT and OGT in place Lungs: Reduced air entry at the bases, no wheezing or rhonchi  Cardiovascular: RRR, no MRG  Abdomen: soft, non-distended, active bowel sounds  Skin: intact, no rash Neuro: prosthetic left eye, right eye with pupil 5 mm briskly reactive to light.  Power is intact on right upper extremity, localizing to right lower extremity.  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  Complicated with hydrocephalus status post EVD Acute encephalopathy due to subarachnoid hemorrhage  Patient is lethargic today continue neuro checks every hour Appreciate neurosurgery input, recommend liberalizing blood pressure to systolic 469- 629 EVD in place at level 20, draining well Continue nimodipine Maintain euvolemia and normal natremia Daily TCD's Avoid sedation  Acute respiratory insufficiency due to subarachnoid hemorrhage Patient is now tolerating pressure support trial today because of lethargy, she gets apneic We will try again for pressure support trial Avoid sedation with RASS  goal 0/-1 We will try to extubate her today  Hypertensive emergency:  Patient is on Cleviprex back again Now we will do permissive hypertension with systolic goal less than 124   Prediabetic with hyperglycemia:  a1c 6.2 Continue sliding scale insulin with goal fingerstick glucose  140-180  Mediastinal mass:  Follow-up as an outpatient   Prognosis guarded  Best practice:  Diet: TF  Pain/Anxiety/Delirium protocol (if indicated): As needed propofol VAP protocol (if indicated): Yes DVT prophylaxis: scd GI prophylaxis: ppi Glucose control: monitoring Mobility: Bedrest until EVD is in place Code Status: FULL Family Communication: Per primary team Disposition: ICU  Labs   CBC: Recent Labs  Lab 01/30/20 0427 01/30/20 0753 01/31/20 0011 01/31/20 0113 01/31/20 0450 02/01/20 0554 02/02/20 0421  WBC 10.3  --   --   --  13.3* 22.7* 19.7*  NEUTROABS 7.9*  --   --   --   --   --   --   HGB 13.5   < > 9.9* 9.9* 11.6* 9.6* 9.9*  HCT 43.8   < > 29.0* 29.0* 36.7 30.1* 28.2*  MCV 82.5  --   --   --  81.9 81.6 82.5  PLT 168  --   --   --  114* 114* 113*   < > = values in this interval not displayed.    Basic Metabolic Panel: Recent Labs  Lab 01/30/20 0427 01/30/20 0753 01/31/20 0011 01/31/20 0113 01/31/20 0450 01/31/20 0450 01/31/20 1710 02/01/20 0554 02/01/20 1701 02/02/20 0424 02/02/20 2201  NA 141   < > 140 140 141  --   --  140  --  139  --   K 3.4*   < > 4.8 5.0 3.9  --   --  3.1*  --  4.3  --   CL 104  --   --   --  112*  --   --  112*  --  111  --   CO2 27  --   --   --  15*  --   --  17*  --  21*  --   GLUCOSE 178*  --   --   --  248*  --   --  215*  --  193*  --   BUN 20  --   --   --  23  --   --  25*  --  23  --   CREATININE 0.84  --   --   --  0.90  --   --  0.74  --  0.61  --   CALCIUM 9.6  --   --   --  8.5*  --   --  8.4*  --  8.8*  --   MG  --   --   --   --  2.1   < > 2.1 2.3 3.0* 2.7* 2.9*  PHOS  --   --   --   --  3.0  --  3.7 4.2  4.3 2.8 2.3*  --    < > = values in this interval not displayed.   GFR: Estimated Creatinine Clearance: 76.2 mL/min (by C-G formula based on SCr of 0.61 mg/dL). Recent Labs  Lab 01/30/20 0427 01/31/20 0450 02/01/20 0554 02/02/20 0421  WBC 10.3 13.3* 22.7* 19.7*    Liver Function  Tests: Recent Labs  Lab 01/31/20 0450 02/01/20 0554 02/02/20 0424  ALBUMIN 3.3* 2.9* 2.7*   No results for input(s): LIPASE, AMYLASE in  the last 168 hours. No results for input(s): AMMONIA in the last 168 hours.  ABG    Component Value Date/Time   PHART 7.339 (L) 01/31/2020 0113   PCO2ART 40.4 01/31/2020 0113   PO2ART 231 (H) 01/31/2020 0113   HCO3 22.1 01/31/2020 0113   TCO2 23 01/31/2020 0113   ACIDBASEDEF 4.0 (H) 01/31/2020 0113   O2SAT 100.0 01/31/2020 0113     Coagulation Profile: Recent Labs  Lab 01/30/20 0859  INR 1.0    Cardiac Enzymes: No results for input(s): CKTOTAL, CKMB, CKMBINDEX, TROPONINI in the last 168 hours.  HbA1C: Hgb A1c MFr Bld  Date/Time Value Ref Range Status  01/30/2020 08:59 AM 6.2 (H) 4.8 - 5.6 % Final    Comment:    (NOTE) Pre diabetes:          5.7%-6.4%  Diabetes:              >6.4%  Glycemic control for   <7.0% adults with diabetes     CBG: Recent Labs  Lab 02/02/20 1944 02/02/20 2335 02/03/20 0317 02/03/20 0741 02/03/20 1108  GLUCAP 154* 150* 123* 129* 142*    Total critical care time: 37 minutes  Performed by: [place] care time was exclusive of separately billable procedures and treating other patients.   Critical care was necessary to treat or prevent imminent or life-threatening deterioration.   Critical care was time spent personally by me on the following activities: development of treatment plan with patient and/or surrogate as well as nursing, discussions with consultants, evaluation of patient's response to treatment, examination of patient, obtaining history from patient or surrogate, ordering and performing treatments and interventions, ordering and review of laboratory studies, ordering and review of radiographic studies, pulse oximetry and re-evaluation of patient's condition.   Jacky Kindle MD Critical care physician [facility]  Pager: [phone] Mobile:  [phone]

## 2020-02-04 ENCOUNTER — Inpatient Hospital Stay (HOSPITAL_COMMUNITY): Payer: Self-pay

## 2020-02-04 DIAGNOSIS — I609 Nontraumatic subarachnoid hemorrhage, unspecified: Secondary | ICD-10-CM

## 2020-02-04 LAB — BASIC METABOLIC PANEL
Anion gap: 11 (ref 5–15)
BUN: 19 mg/dL (ref 8–23)
CO2: 26 mmol/L (ref 22–32)
Calcium: 10 mg/dL (ref 8.9–10.3)
Chloride: 112 mmol/L — ABNORMAL HIGH (ref 98–111)
Creatinine, Ser: 0.53 mg/dL (ref 0.44–1.00)
GFR, Estimated: >60 mL/min (ref >60)
Glucose, Bld: 133 mg/dL — ABNORMAL HIGH (ref 70–99)
Potassium: 3.9 mmol/L (ref 3.5–5.1)
Sodium: 149 mmol/L — ABNORMAL HIGH (ref 135–145)

## 2020-02-04 LAB — GLUCOSE, CAPILLARY
Glucose-Capillary: 149 mg/dL — ABNORMAL HIGH (ref 70–99)
Glucose-Capillary: 145 mg/dL — ABNORMAL HIGH (ref 70–99)
Glucose-Capillary: 153 mg/dL — ABNORMAL HIGH (ref 70–99)
Glucose-Capillary: 133 mg/dL — ABNORMAL HIGH (ref 70–99)
Glucose-Capillary: 149 mg/dL — ABNORMAL HIGH (ref 70–99)
Glucose-Capillary: 137 mg/dL — ABNORMAL HIGH (ref 70–99)

## 2020-02-04 LAB — CBC
HCT: 32.6 % — ABNORMAL LOW (ref 36.0–46.0)
Hemoglobin: 10.1 g/dL — ABNORMAL LOW (ref 12.0–15.0)
MCH: 25.3 pg — ABNORMAL LOW (ref 26.0–34.0)
MCHC: 31 g/dL (ref 30.0–36.0)
MCV: 81.5 fL (ref 80.0–100.0)
Platelets: 165 10*3/uL (ref 150–400)
RBC: 4 MIL/uL (ref 3.87–5.11)
RDW: 15.9 % — ABNORMAL HIGH (ref 11.5–15.5)
WBC: 15.8 10*3/uL — ABNORMAL HIGH (ref 4.0–10.5)
nRBC: 0 % (ref 0.0–0.2)

## 2020-02-04 LAB — MAGNESIUM: Magnesium: 2.9 mg/dL — ABNORMAL HIGH (ref 1.7–2.4)

## 2020-02-04 LAB — PHOSPHORUS: Phosphorus: 2.8 mg/dL (ref 2.5–4.6)

## 2020-02-04 LAB — TRIGLYCERIDES: Triglycerides: 109 mg/dL (ref <150)

## 2020-02-04 MED ORDER — DEXAMETHASONE SODIUM PHOSPHATE 4 MG/ML IJ SOLN
4.0000 mg | Freq: Two times a day (BID) | INTRAMUSCULAR | Status: AC
  Administered 2020-02-04 – 2020-02-05 (×3): 4 mg via INTRAVENOUS
  Filled 2020-02-04 (×3): qty 1

## 2020-02-04 NOTE — Progress Notes (Signed)
SLP Cancellation Note  Patient Details Name: Stacy Moran MRN: [id number] DOB: [birth year]   Cancelled treatment:       Reason Eval/Treat Not Completed: Medical issues which prohibited therapy (pt remains on vent). Per MD note on previous date, working toward extubation. Will f/u as able.   Osie Bond., M.A. [facility] Pager [phone] Office [phone]  02/04/2020, 7:13 AM

## 2020-02-04 NOTE — Progress Notes (Signed)
Transcranial Doppler  Date POD PCO2 HCT BP  MCA ACA PCA OPHT SIPH VERT Basilar  11/4 RH     Right  Left   *  *   *  *   *  *   20  16   86  23   *  21   *      11/5,rs     Right  Left   *  *   *  *   *  *   23  19   99  28   *     *      11/8/ GC     Right  Left   *  *   *  *   *  *   12  10   31   *   *     *            Right  Left                                             Right  Left                                            Right  Left                                            Right  Left                                        MCA = Middle Cerebral Artery      OPHT = Opthalmic Artery     BASILAR = Basilar Artery   ACA = Anterior Cerebral Artery     SIPH = Carotid Siphon PCA = Posterior Cerebral Artery   VERT = Verterbral Artery                 *- Unable to insonate   Normal MCA = 62+\-12 ACA = 50+\-12 PCA = 42+\-23    02/01/20 - (*) unable to insonate - poor windows. Rita Sturdivant, BS, RDMS, RVT  02/04/2020 - * Unable to insonate.  02/04/20 1:33 PM Carlos Levering RVT

## 2020-02-04 NOTE — Progress Notes (Signed)
  NEUROSURGERY PROGRESS NOTE   Pt seen and examined. No issues overnight.   EXAM: Temp:  [99.7 F (37.6 C)-101.6 F (38.7 C)] 99.9 F (37.7 C) (11/08 0800) Pulse Rate:  [78-119] 119 (11/08 0800) Resp:  [14-24] 20 (11/08 0800) BP: (122-235)/(55-122) 122/103 (11/08 0755) SpO2:  [99 %-100 %] 100 % (11/08 0800) FiO2 (%):  [40 %] 40 % (11/08 0755) Weight:  [65.7 kg] 65.7 kg (11/08 0358) Intake/Output      11/07 0701 - 11/08 0700 11/08 0701 - 11/09 0700   I.V. (mL/kg) 250.5 (3.8) 15.9 (0.2)   NG/GT 320 280   IV Piggyback 100 100   Total Intake(mL/kg) 670.5 (10.2) 395.9 (6)   Urine (mL/kg/hr) 700 (0.4)    Drains 190 25   Total Output 890 25   Net -219.6 +370.9        Urine Occurrence 3 x     Awake, alert Breathing spontaneously on vent Attends to voice, following commands briskly RUE/RLE appears to have good strength Follows commands (wiggles toes, shows thumb) LUE/LLE but appears to have some weakness Wounds c/d/i EVD in place, functional open @ 26mmHg, blood-tinged CSF  LABS: Lab Results  Component Value Date   CREATININE 0.61 02/02/2020   BUN 23 02/02/2020   NA 139 02/02/2020   K 4.3 02/02/2020   CL 111 02/02/2020   CO2 21 (L) 02/02/2020   Lab Results  Component Value Date   WBC 19.7 (H) 02/02/2020   HGB 9.9 (L) 02/02/2020   HCT 28.2 (L) 02/02/2020   MCV 82.5 02/02/2020   PLT 113 (L) 02/02/2020    IMPRESSION: - 64 y.o. female POD# 5 SAH# 5 s/p clipping L PICA aneurysm, appears to be improved neurologically now alert, following commands throughout with likely left hemiparesis  PLAN: - Wean to extubate per PCCM - Keep EVD open @ 67mmHg - Allow SBP up to 271mmHg - Cont Nimotop

## 2020-02-04 NOTE — Evaluation (Signed)
Occupational Therapy Evaluation Patient Details Name: Stacy Moran MRN: [id number] DOB: 1955/06/12 Today's Date: 02/04/2020    History of Present Illness pt is a 64 y/o female with no known PHX admitted with sudden onset of HA, BP 262/123, Imaging showing large volume SAHdue to a ruptured L PICA aneurysm associated with intraventricular hydrocephalus.  11/4, pt s/p L far-lateral craniotomy for clipping of PICA aneurysm.   Clinical Impression   Patient is s/p L craniotomy with clipping PICA aneurysm surgery resulting in functional limitations due to the deficits listed below (see OT problem list). Pt currently following commands with R UE and needs upright posture to help with arousal for sustained attention. Pt total +2 total for bed mobility at this time.  Patient will benefit from skilled OT acutely to increase independence and safety with ADLS to allow discharge SNF.     Follow Up Recommendations  SNF    Equipment Recommendations  Wheelchair (measurements OT);Wheelchair cushion (measurements OT);Hospital bed    Recommendations for Other Services       Precautions / Restrictions Precautions Precautions: Fall Precaution Comments: EVD in place on vent      Mobility Bed Mobility Overal bed mobility: Needs Assistance Bed Mobility: Supine to Sit;Sit to Supine     Supine to sit: +2 for physical assistance;Total assist Sit to supine: +2 for physical assistance;Total assist   General bed mobility comments: helicopter to EOB and back with total dependence at this time    Transfers                 General transfer comment: not appropriate    Balance Overall balance assessment: Needs assistance Sitting-balance support: No upper extremity supported;Feet supported Sitting balance-Leahy Scale: Zero Sitting balance - Comments: demonstrates some core activation with pulling body to the R                                   ADL either performed or assessed  with clinical judgement   ADL Overall ADL's : Needs assistance/impaired                                       General ADL Comments: total (A) for all adls at this time but showing increased activation of R hand with limited use ot mitten and wrist restraints.      Vision   Additional Comments: noted to have a haze to L eye and appears very very dry. pt noted to have drops in the room but noted ot have decreased moisture to L eye     Perception     Praxis      Pertinent Vitals/Pain Pain Assessment: No/denies pain Pain Intervention(s):  (shakes head no )     Hand Dominance Right   Extremity/Trunk Assessment Upper Extremity Assessment Upper Extremity Assessment: LUE deficits/detail RUE Deficits / Details: squeezing thumbs up scratch on command opening digits into abduction on command. scapula elevation on command multiple times LUE Deficits / Details: flaccid not movement noted   Lower Extremity Assessment Lower Extremity Assessment: RLE deficits/detail RLE Deficits / Details: movement no commands   Cervical / Trunk Assessment Cervical / Trunk Assessment: Other exceptions Cervical / Trunk Exceptions: R rotation preference and vent on R. pt needed help rotating to the L and unable to sustain.    Communication Communication Communication: Other (comment) (intubated  via ETT)   Cognition Arousal/Alertness: Awake/alert Behavior During Therapy: Flat affect Overall Cognitive Status: Impaired/Different from baseline                                 General Comments: pt able to answer questions accuractely 100% 6 out 6 questions. pt knowns name dob location but unaware of reason for admission based on how we asked questions. pt educated during session but unable to confirm learning due to intubated orally. pt nodding head yes and not and using thumbs up.    General Comments       Exercises     Shoulder Instructions      Home Living  Family/patient expects to be discharged to:: Unsure                                        Prior Functioning/Environment                   OT Problem List: Decreased strength;Decreased activity tolerance;Decreased range of motion;Impaired balance (sitting and/or standing);Impaired vision/perception;Decreased coordination;Decreased cognition;Decreased safety awareness;Decreased knowledge of use of DME or AE;Decreased knowledge of precautions;Cardiopulmonary status limiting activity;Impaired sensation;Obesity;Impaired UE functional use      OT Treatment/Interventions: Self-care/ADL training;Therapeutic exercise;Neuromuscular education;Energy conservation;DME and/or AE instruction;Manual therapy;Modalities;Splinting;Therapeutic activities;Cognitive remediation/compensation;Patient/family education;Balance training;Visual/perceptual remediation/compensation    OT Goals(Current goals can be found in the care plan section) Acute Rehab OT Goals Patient Stated Goal: intubated no family present OT Goal Formulation: With patient Time For Goal Achievement: 02/18/20 Potential to Achieve Goals: Good  OT Frequency: Min 2X/week   Barriers to D/C: Decreased caregiver support          Co-evaluation PT/OT/SLP Co-Evaluation/Treatment: Yes Reason for Co-Treatment: Necessary to address cognition/behavior during functional activity;Complexity of the patient's impairments (multi-system involvement);For patient/therapist safety   OT goals addressed during session: ADL's and self-care      AM-PAC OT "6 Clicks" Daily Activity     Outcome Measure Help from another person eating meals?: Total Help from another person taking care of personal grooming?: Total Help from another person toileting, which includes using toliet, bedpan, or urinal?: Total Help from another person bathing (including washing, rinsing, drying)?: Total Help from another person to put on and taking off regular  upper body clothing?: Total Help from another person to put on and taking off regular lower body clothing?: Total 6 Click Score: 6   End of Session Equipment Utilized During Treatment: Oxygen Nurse Communication: Mobility status;Precautions  Activity Tolerance: Patient tolerated treatment well Patient left: in bed;with call bell/phone within reach;with bed alarm set;with restraints reapplied;with SCD's reapplied  OT Visit Diagnosis: Unsteadiness on feet (R26.81);Muscle weakness (generalized) (M62.81);Other abnormalities of gait and mobility (R26.89);Hemiplegia and hemiparesis Hemiplegia - Right/Left: Left Hemiplegia - dominant/non-dominant: Non-Dominant Hemiplegia - caused by: Nontraumatic intracerebral hemorrhage                Time: 1325-1351 OT Time Calculation (min): 26 min Charges:  OT General Charges $OT Visit: 1 Visit OT Evaluation $OT Eval Moderate Complexity: 1 Mod   Brynn, OTR/L  Acute Rehabilitation Services Pager: [phone] Office: [phone] .   Jeri Modena 02/04/2020, 2:17 PM

## 2020-02-04 NOTE — Progress Notes (Addendum)
NAME:  Stacy Moran, MRN:  [id number], DOB:  [birth year], LOS: 0 ADMISSION DATE:  01/30/2020, CONSULTATION DATE:  01/30/2020 REFERRING MD:  Ferne Reus, PA, CHIEF COMPLAINT:  Headache  Brief History   17 yoF presenting with sudden onset of worst headache of her life.  BP 262/123.  CT/ CTA showed large volume SAH secondary to ruptured PICA with resultant hydrocephalus. Progressive decline in mental status in ER requiring intubation for airway protection.    Past Medical History  No known past medical history   Significant Hospital Events   11/3 Admitted 11/4 craniotomy with clipping of left PICA  Consults:  PCCM  Procedures:  11/3 ETT >> 11/3 EVD >>  Significant Diagnostic Tests:  11/3 CTH > 1. 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 2. Intraventricular reflux with hydrocephalus. 3. Chronic small vessel disease. 11/3 CTA head/ neck > 1. Ruptured 3 mm left PICA aneurysm. 2. The left vertebral artery is dominant and diffusely patent. Moderate right V4 segment stenosis. 3. 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 After recovery, recommend chest CT with contrast. 4. No stenosis or significant atherosclerosis in the neck. 5. 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  Micro Data:  11/3 SARS2/ Flu > neg  Antimicrobials:  n/a  Interim history/subjective:  Following commands, moving all extremities. She denies pain.   Objective   Blood pressure (!) 218/101, pulse 86, temperature (!) 101.6 F (38.7 C), temperature source Axillary, resp. rate 16, height 5\' 9"  (1.753 m), weight 65.7 kg, SpO2 100 %.    Vent Mode: PRVC FiO2 (%):  [40 %] 40 % Set Rate:  [16 bmp] 16 bmp Vt Set:  [520 mL] 520 mL PEEP:  [5 cmH20] 5 cmH20 Pressure Support:  [12 cmH20] 12 cmH20 Plateau Pressure:  [16 cmH20-18 cmH20] 17  cmH20   Intake/Output Summary (Last 24 hours) at 02/04/2020 0711 Last data filed at 02/04/2020 0600 Gross per 24 hour  Intake 670.45 ml  Output 870 ml  Net -199.55 ml   Filed Weights   02/02/20 0400 02/03/20 0500 02/04/20 0358  Weight: 66.9 kg 66.9 kg 65.7 kg    Examination:  General: Middle-aged African-American female, lying in the bed, orally intubated HEENT: EVD in place, moist mucous membranes ETT and OGT in place Lungs: Reduced air entry at the bases, no wheezing or rhonchi  Cardiovascular: RRR, no MRG  Abdomen: soft, non-distended, active bowel sounds  Skin: intact, no rash Neuro: prosthetic left eye, right eye with pupil briskly reactive to light.  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  Complicated with hydrocephalus status post EVD Acute encephalopathy due to subarachnoid hemorrhage Mentation improved today, following commands and moving all extremities.  - continue neuro checks every hour - Appreciate neurosurgery input, recommend liberalizing blood pressure to systolic 798- 921 - cleviprex gtt, clonidine .1 bid, losartan - EVD in place at level 10 H20, draining well - Continue nimodipine - Maintain euvolemia and normal natremia - continue keppra  - Daily TCD's - Avoid sedation  Febrile Leukocytosis Fever and leukocytosis possibly secondary to inflammation from [facility].  - blood culture   Acute respiratory insufficiency due to subarachnoid hemorrhage Patient is now tolerating pressure support,  mental status improved today.  We will try again for pressure support trial today.  - Avoid sedation with RASS goal 0/-1 - Extubate today   Hypertensive emergency:  - Patient is on Cleviprex, losartan 100, clonidine .1 bid, labetalol prn - Now we will do  permissive hypertension with systolic goal less than 024 with improved mentation with higher bp goal   Prediabetic with hyperglycemia:  a1c 6.2 Continue sliding scale insulin with goal fingerstick glucose 140-180  Mediastinal mass:  Follow-up as an outpatient   Best practice:  Diet: TF  Pain/Anxiety/Delirium protocol (if indicated): As needed propofol VAP protocol (if indicated): Yes DVT prophylaxis: scd GI prophylaxis: ppi Glucose control: monitoring Mobility: mobilize  Code Status: FULL Family Communication: Per primary team Disposition: ICU  Labs   CBC: Recent Labs  Lab 01/30/20 0427 01/30/20 0753 01/31/20 0011 01/31/20 0113 01/31/20 0450 02/01/20 0554 02/02/20 0421  WBC 10.3  --   --   --  13.3* 22.7* 19.7*  NEUTROABS 7.9*  --   --   --   --   --   --   HGB 13.5   < > 9.9* 9.9* 11.6* 9.6* 9.9*  HCT 43.8   < > 29.0* 29.0* 36.7 30.1* 28.2*  MCV 82.5  --   --   --  81.9 81.6 82.5  PLT 168  --   --   --  114* 114* 113*   < > = values in this interval not displayed.    Basic Metabolic Panel: Recent Labs  Lab 01/30/20 0427 01/30/20 0753 01/31/20 0011 01/31/20 0113 01/31/20 0450 01/31/20 0450 01/31/20 1710 02/01/20 0554 02/01/20 1701 02/02/20 0424 02/02/20 2201  NA 141   < > 140 140 141  --   --  140  --  139  --   K 3.4*   < > 4.8 5.0 3.9  --   --  3.1*  --  4.3  --   CL 104  --   --   --  112*  --   --  112*  --  111  --   CO2 27  --   --   --  15*  --   --  17*  --  21*  --   GLUCOSE 178*  --   --   --  248*  --   --  215*  --  193*  --   BUN 20  --   --   --  23  --   --  25*  --  23  --   CREATININE 0.84  --   --   --  0.90  --   --  0.74  --  0.61  --   CALCIUM 9.6  --   --   --  8.5*  --   --  8.4*  --  8.8*  --   MG  --   --   --   --  2.1   < > 2.1 2.3 3.0* 2.7* 2.9*  PHOS  --   --   --   --  3.0  --  3.7 4.2  4.3 2.8 2.3*  --    < > = values in this interval not displayed.   GFR: Estimated Creatinine Clearance: 75.6 mL/min (by C-G formula  based on SCr of 0.61 mg/dL). Recent Labs  Lab 01/30/20 0427 01/31/20 0450 02/01/20 0554 02/02/20 0421  WBC 10.3 13.3* 22.7* 19.7*    Liver Function Tests:  Recent Labs  Lab 01/31/20 0450 02/01/20 0554 02/02/20 0424  ALBUMIN 3.3* 2.9* 2.7*   No results for input(s): LIPASE, AMYLASE in the last 168 hours. No results for input(s): AMMONIA in the last 168 hours.  ABG    Component Value Date/Time   PHART 7.339 (L) 01/31/2020 0113   PCO2ART 40.4 01/31/2020 0113   PO2ART 231 (H) 01/31/2020 0113   HCO3 22.1 01/31/2020 0113   TCO2 23 01/31/2020 0113   ACIDBASEDEF 4.0 (H) 01/31/2020 0113   O2SAT 100.0 01/31/2020 0113     Coagulation Profile: Recent Labs  Lab 01/30/20 0859  INR 1.0    Cardiac Enzymes: No results for input(s): CKTOTAL, CKMB, CKMBINDEX, TROPONINI in the last 168 hours.  HbA1C: Hgb A1c MFr Bld  Date/Time Value Ref Range Status  01/30/2020 08:59 AM 6.2 (H) 4.8 - 5.6 % Final    Comment:    (NOTE) Pre diabetes:          5.7%-6.4%  Diabetes:              >6.4%  Glycemic control for   <7.0% adults with diabetes     CBG: Recent Labs  Lab 02/03/20 1108 02/03/20 1536 02/03/20 1938 02/03/20 2342 02/04/20 0345  GLUCAP 142* 151* 152* 121* 149*    Tanisia Yokley A, DO 02/04/2020, 7:11 AM Pager: [phone]

## 2020-02-04 NOTE — Progress Notes (Signed)
Physical Therapy Treatment Patient Details Name: Stacy Moran MRN: [id number] DOB: Feb 23, 1956 Today's Date: 02/04/2020    History of Present Illness pt is a 64 y/o female with no known PHX admitted with sudden onset of HA, BP 262/123, Imaging showing large volume SAHdue to a ruptured L PICA aneurysm associated with intraventricular hydrocephalus.  11/4, pt s/p L far-lateral craniotomy for clipping of PICA aneurysm.    PT Comments    More alert and participative today.  Answering orientation questions appropriately, following simple commands fairly consistently.   Emphasis on transitions, sitting balance/truncal coordination, following commands.    Follow Up Recommendations  SNF;Other (comment) (Working towar attain CIR levels.)     Equipment Recommendations  Other (comment) (TBD)    Recommendations for Other Services       Precautions / Restrictions Precautions Precautions: Fall Precaution Comments: EVD in place on vent    Mobility  Bed Mobility Overal bed mobility: Needs Assistance Bed Mobility: Supine to Sit;Sit to Supine     Supine to sit: +2 for physical assistance;Total assist Sit to supine: +2 for physical assistance;Total assist   General bed mobility comments: helicopter to EOB and back with total dependence at this time  Transfers                 General transfer comment: not appropriate  Ambulation/Gait                 Stairs             Wheelchair Mobility    Modified Rankin (Stroke Patients Only)       Balance Overall balance assessment: Needs assistance Sitting-balance support: No upper extremity supported;Feet supported Sitting balance-Leahy Scale: Zero Sitting balance - Comments: Sat EOB ~8 min.  Demonstrates some core activation with pulling body to the R                                    Cognition Arousal/Alertness: Awake/alert Behavior During Therapy: Flat affect Overall Cognitive Status:  Impaired/Different from baseline                                 General Comments: pt able to answer questions accuractely 100% 6 out 6 questions. pt knowns name dob location but unaware of reason for admission based on how we asked questions. pt educated during session but unable to confirm learning due to intubated orally. pt nodding head yes and not and using thumbs up.       Exercises Other Exercises Other Exercises:   bil LE  warm up PROM    General Comments General comments (skin integrity, edema, etc.): vss      Pertinent Vitals/Pain Pain Assessment: No/denies pain Pain Intervention(s): Monitored during session    Home Living Family/patient expects to be discharged to:: Unsure                    Prior Function            PT Goals (current goals can now be found in the care plan section) Acute Rehab PT Goals Patient Stated Goal: intubated no family present PT Goal Formulation: Patient unable to participate in goal setting Time For Goal Achievement: 02/16/20 Potential to Achieve Goals: Fair    Frequency    Min 3X/week      PT Plan  Co-evaluation PT/OT/SLP Co-Evaluation/Treatment: Yes Reason for Co-Treatment: Necessary to address cognition/behavior during functional activity PT goals addressed during session: Mobility/safety with mobility;Strengthening/ROM OT goals addressed during session: ADL's and self-care      AM-PAC PT "6 Clicks" Mobility   Outcome Measure  Help needed turning from your back to your side while in a flat bed without using bedrails?: Total Help needed moving from lying on your back to sitting on the side of a flat bed without using bedrails?: Total Help needed moving to and from a bed to a chair (including a wheelchair)?: Total Help needed standing up from a chair using your arms (e.g., wheelchair or bedside chair)?: Total Help needed to walk in hospital room?: Total Help needed climbing 3-5 steps with a  railing? : Total 6 Click Score: 6    End of Session Equipment Utilized During Treatment: Oxygen Activity Tolerance: Patient tolerated treatment well Patient left: in bed;with call bell/phone within reach;with bed alarm set;with SCD's reapplied;with restraints reapplied Nurse Communication: Mobility status PT Visit Diagnosis: Hemiplegia and hemiparesis;Other symptoms and signs involving the nervous system (R29.898);Other abnormalities of gait and mobility (R26.89) Hemiplegia - Right/Left: Left Hemiplegia - dominant/non-dominant: Non-dominant Hemiplegia - caused by: Other Nontraumatic intracranial hemorrhage     Time: 8110-3159 PT Time Calculation (min) (ACUTE ONLY): 29 min  Charges:  $Therapeutic Activity: 8-22 mins                     02/04/2020  Stacy Moran., PT Acute Rehabilitation Services [phone]  (pager) [phone]  (office)   Stacy Moran Kaysha Parsell 02/04/2020, 3:39 PM

## 2020-02-04 NOTE — Progress Notes (Signed)
RT Late Note: RT found pt on full support w/the following settings 520/16/40%/+5. Pt was suppose to be in CPAP/PSV as per report from day shift RT. Neither day shift nor night shift RT notified of pt being placed back on full support. No documentation of event as well. RT notified Lead RT.

## 2020-02-05 ENCOUNTER — Inpatient Hospital Stay (HOSPITAL_COMMUNITY): Payer: Self-pay

## 2020-02-05 LAB — POCT I-STAT 7, (LYTES, BLD GAS, ICA,H+H)
Acid-Base Excess: 7 mmol/L — ABNORMAL HIGH (ref 0.0–2.0)
Bicarbonate: 31.3 mmol/L — ABNORMAL HIGH (ref 20.0–28.0)
Calcium, Ion: 1.3 mmol/L (ref 1.15–1.40)
HCT: 28 % — ABNORMAL LOW (ref 36.0–46.0)
Hemoglobin: 9.5 g/dL — ABNORMAL LOW (ref 12.0–15.0)
O2 Saturation: 92 %
Patient temperature: 99
Potassium: 3.6 mmol/L (ref 3.5–5.1)
Sodium: 155 mmol/L — ABNORMAL HIGH (ref 135–145)
TCO2: 33 mmol/L — ABNORMAL HIGH (ref 22–32)
pCO2 arterial: 41.4 mmHg (ref 32.0–48.0)
pH, Arterial: 7.487 — ABNORMAL HIGH (ref 7.350–7.450)
pO2, Arterial: 59 mmHg — ABNORMAL LOW (ref 83.0–108.0)

## 2020-02-05 LAB — BASIC METABOLIC PANEL
Anion gap: 13 (ref 5–15)
BUN: 28 mg/dL — ABNORMAL HIGH (ref 8–23)
CO2: 24 mmol/L (ref 22–32)
Calcium: 10.2 mg/dL (ref 8.9–10.3)
Chloride: 115 mmol/L — ABNORMAL HIGH (ref 98–111)
Creatinine, Ser: 0.62 mg/dL (ref 0.44–1.00)
GFR, Estimated: >60 mL/min (ref >60)
Glucose, Bld: 130 mg/dL — ABNORMAL HIGH (ref 70–99)
Potassium: 4 mmol/L (ref 3.5–5.1)
Sodium: 152 mmol/L — ABNORMAL HIGH (ref 135–145)

## 2020-02-05 LAB — GLUCOSE, CAPILLARY
Glucose-Capillary: 131 mg/dL — ABNORMAL HIGH (ref 70–99)
Glucose-Capillary: 153 mg/dL — ABNORMAL HIGH (ref 70–99)
Glucose-Capillary: 113 mg/dL — ABNORMAL HIGH (ref 70–99)
Glucose-Capillary: 131 mg/dL — ABNORMAL HIGH (ref 70–99)
Glucose-Capillary: 149 mg/dL — ABNORMAL HIGH (ref 70–99)

## 2020-02-05 LAB — CBC
HCT: 33 % — ABNORMAL LOW (ref 36.0–46.0)
Hemoglobin: 10.1 g/dL — ABNORMAL LOW (ref 12.0–15.0)
MCH: 25.8 pg — ABNORMAL LOW (ref 26.0–34.0)
MCHC: 30.6 g/dL (ref 30.0–36.0)
MCV: 84.4 fL (ref 80.0–100.0)
Platelets: 149 10*3/uL — ABNORMAL LOW (ref 150–400)
RBC: 3.91 MIL/uL (ref 3.87–5.11)
RDW: 16 % — ABNORMAL HIGH (ref 11.5–15.5)
WBC: 15.4 10*3/uL — ABNORMAL HIGH (ref 4.0–10.5)
nRBC: 0.1 % (ref 0.0–0.2)

## 2020-02-05 IMAGING — DX DG CHEST 1V
1 series · 1 of 1 positions shown · non-contrast
Comparison: [DATE]

CLINICAL DATA: Check endotracheal tube placement

EXAM:
CHEST  1 VIEW

[chest ap]
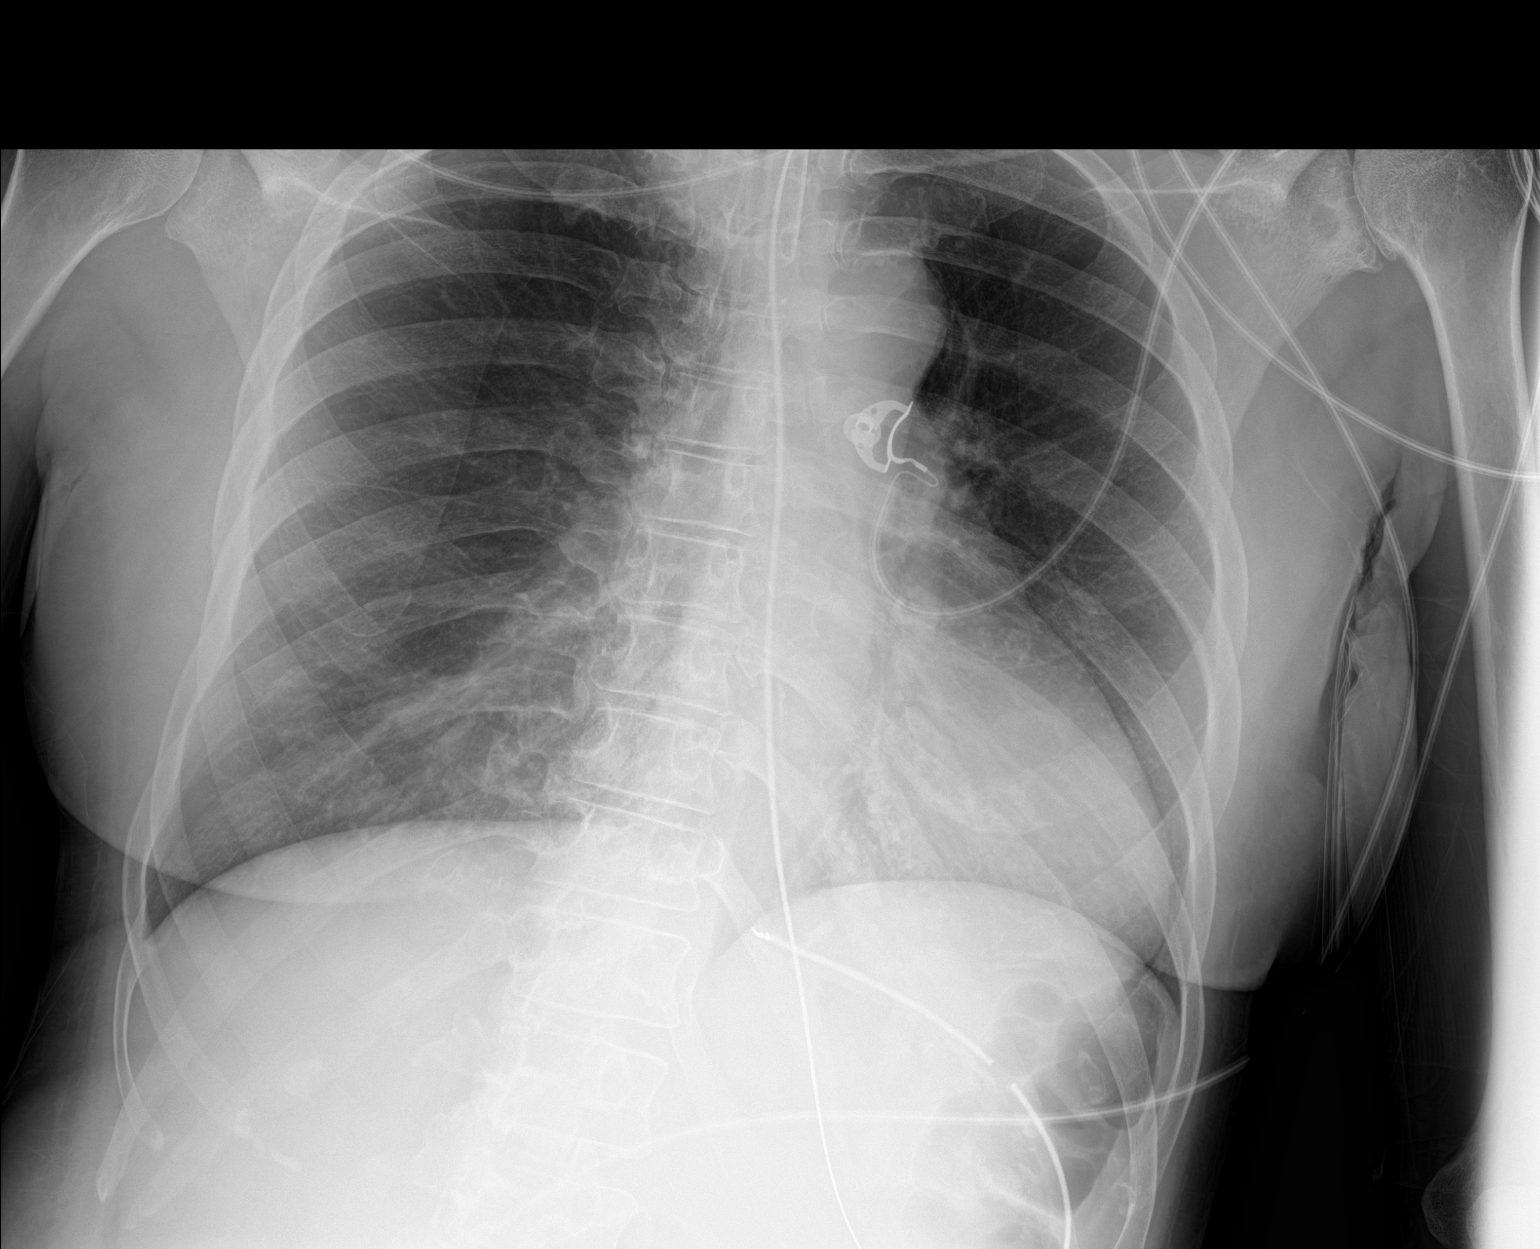

[1 of 1 positions shown; findings below may reference images not displayed]

FINDINGS: Cardiac shadow is stable. Endotracheal tube and gastric catheter are
noted in satisfactory position. The lungs are clear bilaterally. No
acute bony abnormality is seen.
IMPRESSION: Tubes and lines in satisfactory position.

No acute abnormality noted.

## 2020-02-05 IMAGING — US US SOFT TISSUE HEAD/NECK
1 series · 8 of 8 positions shown · non-contrast
Comparison: None.

CLINICAL DATA: 64-year-old female with left neck mass.

EXAM:
ULTRASOUND OF HEAD/NECK SOFT TISSUES
TECHNIQUE: Ultrasound examination of the head and neck soft tissues was
performed in the area of clinical concern.

[Series 1: us soft tissue head/neck · 0.08mm/px · 8 acquisitions, 8 frames shown]
[im 1/8]
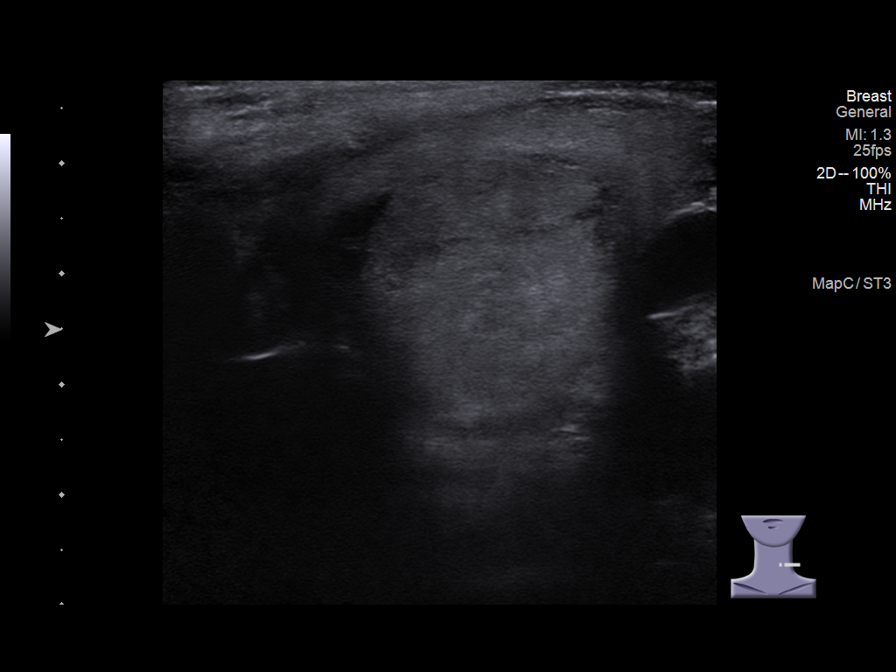
[im 2/8]
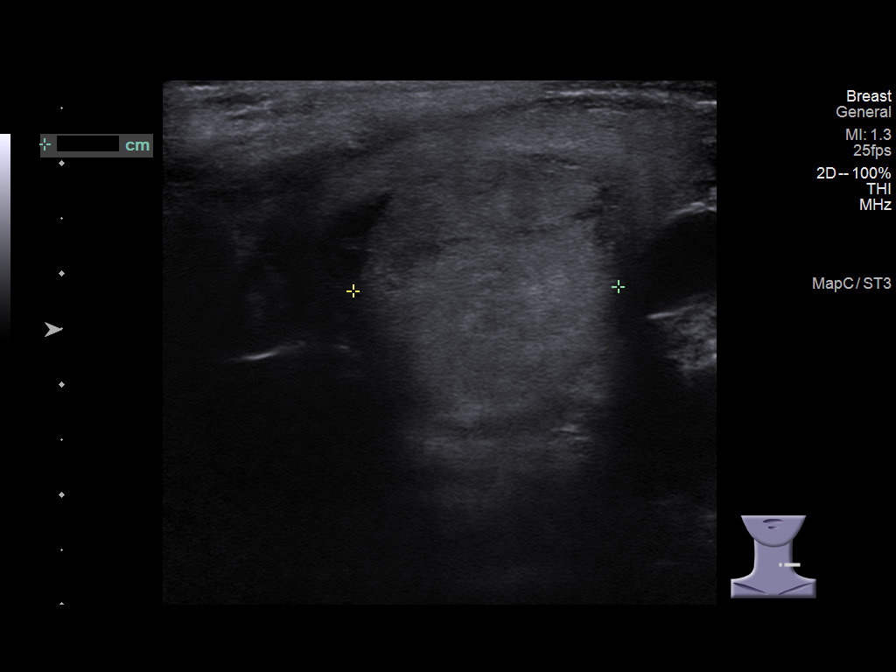
[im 3/8]
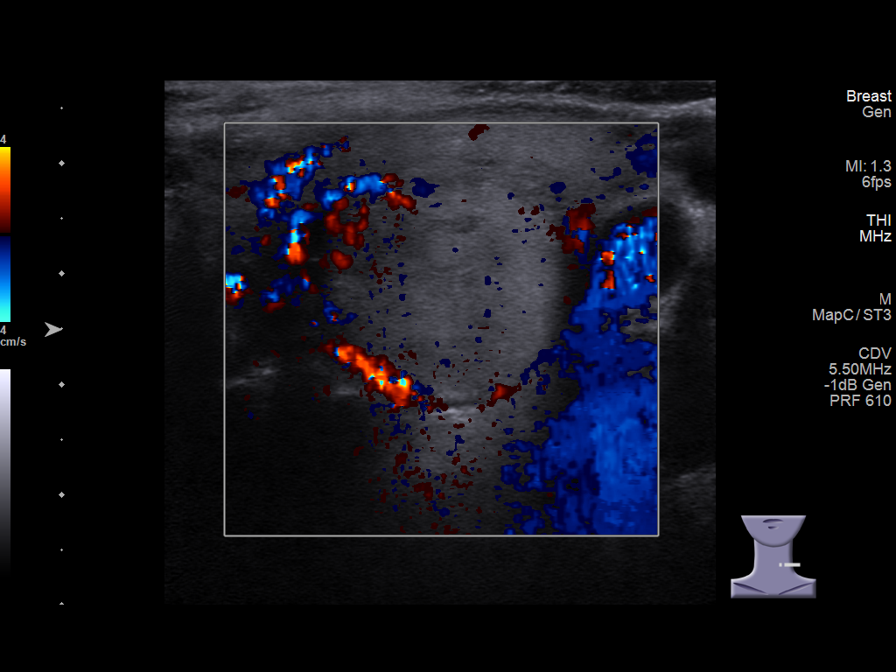
[im 4/8]
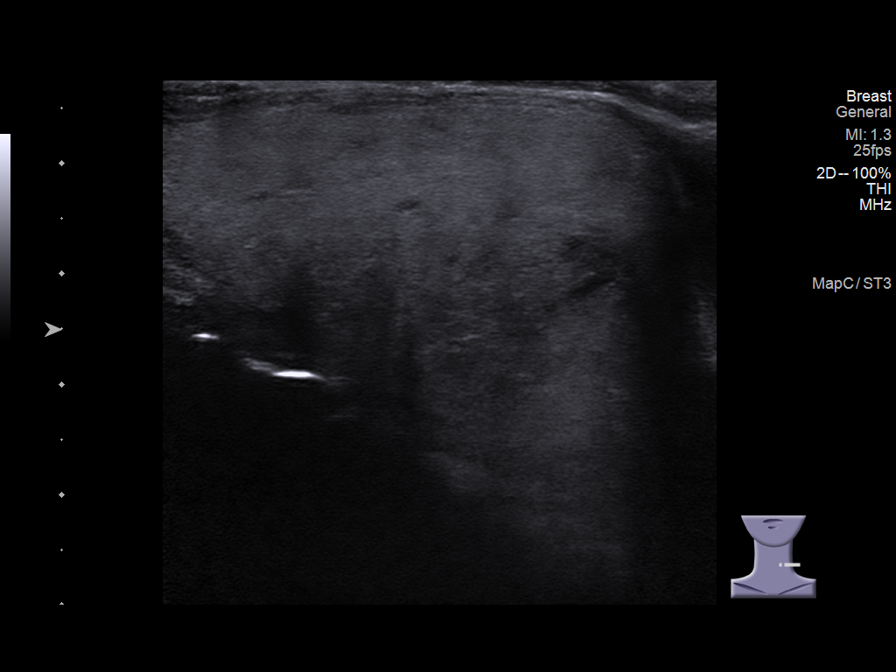
[im 5/8]
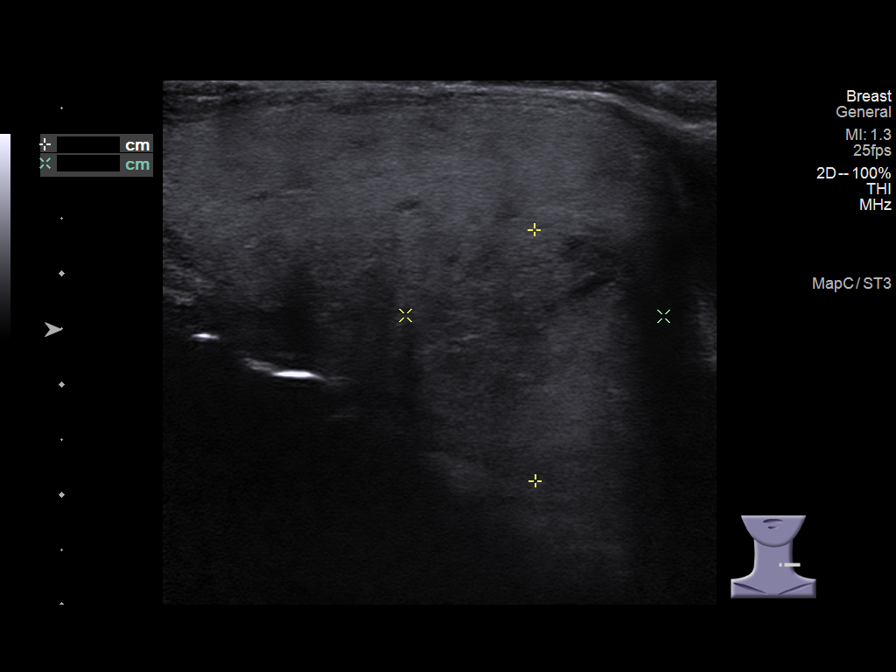
[im 6/8]
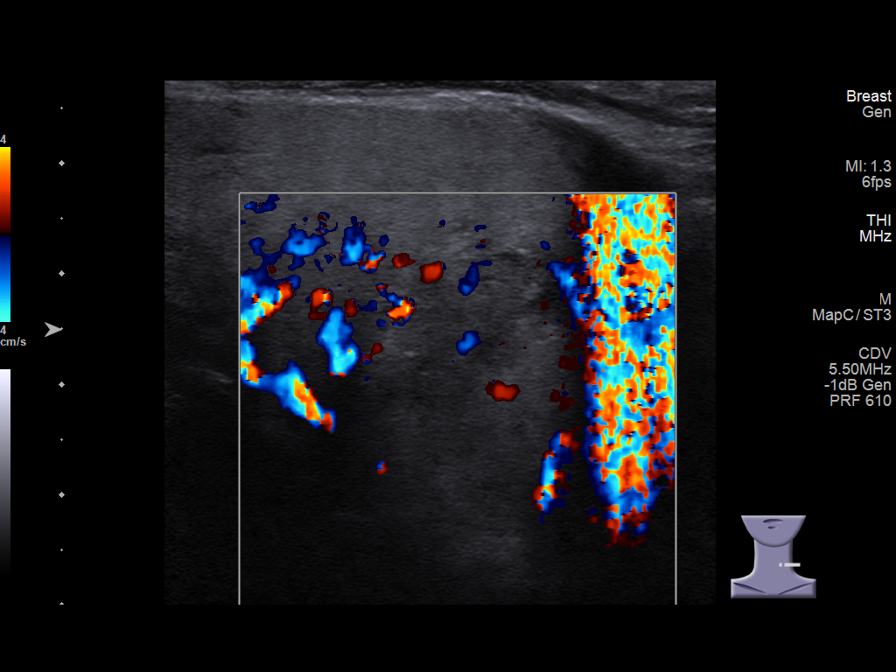
[im 7/8]
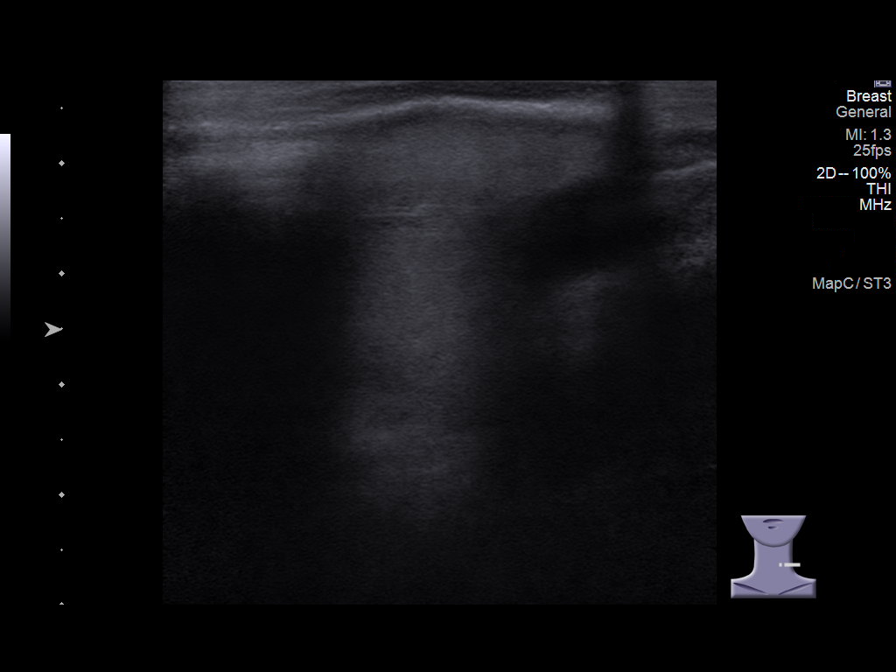
[im 8/8]
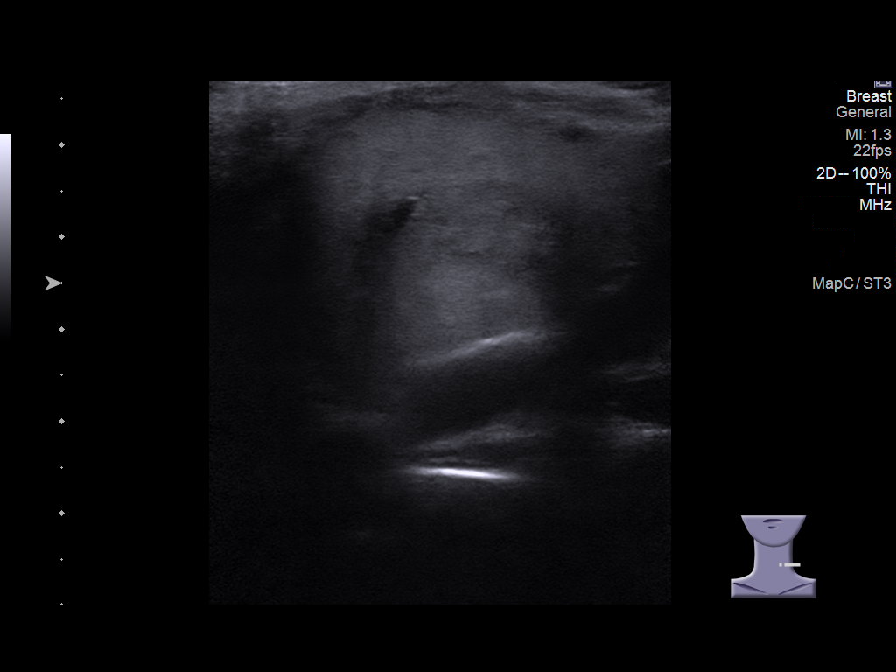

[8 of 8 positions shown; findings below may reference images not displayed]

FINDINGS: Targeted sonographic images of the left neck in the region of the
clinical concern was performed using grayscale and color Doppler.

There is a 2.3 x 2.3 x 2.4 cm rounded mass in the left neck along
the inferior aspect of the left thyroid lobe which appears to be
arising from the thyroid gland. This mass demonstrates similar
echogenicity as the background of thyroid gland. Color images
demonstrate some flow within this mass. No drainable fluid
collection or abscess identified. This likely represents a thyroid
nodule. Dedicated thyroid ultrasound is recommended for better
evaluation on a nonemergent/outpatient basis.
IMPRESSION: Probable right thyroid nodule. Further evaluation with dedicated
thyroid ultrasound on a nonemergent/outpatient basis recommended.

## 2020-02-05 MED ORDER — RACEPINEPHRINE HCL 2.25 % IN NEBU
0.5000 mL | INHALATION_SOLUTION | Freq: Once | RESPIRATORY_TRACT | Status: AC
  Administered 2020-02-05: 0.5 mL via RESPIRATORY_TRACT

## 2020-02-05 MED ORDER — DEXAMETHASONE SODIUM PHOSPHATE 4 MG/ML IJ SOLN
4.0000 mg | Freq: Two times a day (BID) | INTRAMUSCULAR | Status: DC
  Administered 2020-02-05: 4 mg via INTRAVENOUS
  Filled 2020-02-05: qty 1

## 2020-02-05 MED ORDER — ROCURONIUM BROMIDE 10 MG/ML (PF) SYRINGE
100.0000 mg | PREFILLED_SYRINGE | Freq: Once | INTRAVENOUS | Status: AC
  Administered 2020-02-05: 100 mg via INTRAVENOUS

## 2020-02-05 MED ORDER — HEPARIN SODIUM (PORCINE) 5000 UNIT/ML IJ SOLN
5000.0000 [IU] | Freq: Three times a day (TID) | INTRAMUSCULAR | Status: DC
  Administered 2020-02-05 – 2020-04-22 (×220): 5000 [IU] via SUBCUTANEOUS
  Filled 2020-02-05 (×224): qty 1

## 2020-02-05 MED ORDER — RACEPINEPHRINE HCL 2.25 % IN NEBU
INHALATION_SOLUTION | RESPIRATORY_TRACT | Status: AC
  Filled 2020-02-05: qty 0.5

## 2020-02-05 MED ORDER — ETOMIDATE 2 MG/ML IV SOLN
20.0000 mg | Freq: Once | INTRAVENOUS | Status: AC
  Administered 2020-02-05: 20 mg via INTRAVENOUS

## 2020-02-05 MED ORDER — LACTATED RINGERS IV BOLUS
1000.0000 mL | Freq: Once | INTRAVENOUS | Status: AC
  Administered 2020-02-05: 1000 mL via INTRAVENOUS

## 2020-02-05 MED ORDER — DEXMEDETOMIDINE HCL IN NACL 400 MCG/100ML IV SOLN
0.4000 ug/kg/h | INTRAVENOUS | Status: DC
  Administered 2020-02-05 (×2): 0.4 ug/kg/h via INTRAVENOUS
  Administered 2020-02-06 – 2020-02-07 (×3): 0.6 ug/kg/h via INTRAVENOUS
  Administered 2020-02-07 – 2020-02-08 (×4): 1.2 ug/kg/h via INTRAVENOUS
  Administered 2020-02-08: 0.8 ug/kg/h via INTRAVENOUS
  Administered 2020-02-09: 0.6 ug/kg/h via INTRAVENOUS
  Administered 2020-02-09: 0.9 ug/kg/h via INTRAVENOUS
  Administered 2020-02-10: 0.4 ug/kg/h via INTRAVENOUS
  Administered 2020-02-11: 0.2 ug/kg/h via INTRAVENOUS
  Administered 2020-02-12: 0.4 ug/kg/h via INTRAVENOUS
  Filled 2020-02-05 (×15): qty 100
  Filled 2020-02-05: qty 200
  Filled 2020-02-05: qty 100

## 2020-02-05 NOTE — Progress Notes (Signed)
Patient was extubated, post extubation she was noted to have stridor, racemic epinephrine nebulization was done, she was noted to have neck swelling, bedside ultrasound was done which shows soft tissue swelling which was slightly compressing trachea.  She desatted to mid 80s, decision was made to reintubate and place her on mechanical ventilation. Currently she is on PRVC mode with O2 sat 100%   Additional critical care time spent 45 minutes   Jacky Kindle MD Critical care physician [place]  Pager: [phone] Mobile: [phone]

## 2020-02-05 NOTE — Procedures (Signed)
Intubation Procedure Note Janautica Netzley  [id number]  [birth year]    Procedure: Intubation Indications: Respiratory insufficiency   Procedure Details Consent: Unable to obtain consent because of emergent medical necessity. Time Out: Performed   Maximum clean technique was used including gloves, hand hygiene and mask  Laryngoscopy equipment used: MAC 3 Glidescope   Medications: Etomidate 20 mg, Rocuronium 100 mg    Evaluation Hemodynamic Status: BP stable throughout, stable throughout Patient's Current Condition: stable Patient did tolerate procedure well Chest X-ray ordered to verify placement.  pending  Marty Heck, DO 02/05/2020, 10:55 AM Pager: [phone]   02/05/2020 10:55 AM

## 2020-02-05 NOTE — Progress Notes (Signed)
NAME:  Stacy Moran, MRN:  [id number], DOB:  1955-12-10, LOS: 0 ADMISSION DATE:  01/30/2020, CONSULTATION DATE:  01/30/2020 REFERRING MD:  Ferne Reus, PA, CHIEF COMPLAINT:  Headache  Brief History   70 yoF presenting with sudden onset of worst headache of her life.  BP 262/123.  CT/ CTA showed large volume SAH secondary to ruptured PICA with resultant hydrocephalus. Progressive decline in mental status in ER requiring intubation for airway protection.    Past Medical History  No known past medical history   Significant Hospital Events   11/3 Admitted 11/4 craniotomy with clipping of left PICA  Consults:  PCCM  Procedures:  11/3 ETT >> 11/3 EVD >>  Significant Diagnostic Tests:  11/3 CTH > 1. 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 2. Intraventricular reflux with hydrocephalus. 3. Chronic small vessel disease. 11/3 CTA head/ neck > 1. Ruptured 3 mm left PICA aneurysm. 2. The left vertebral artery is dominant and diffusely patent. Moderate right V4 segment stenosis. 3. 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 After recovery, recommend chest CT with contrast. 4. No stenosis or significant atherosclerosis in the neck. 5. 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  Micro Data:  11/3 SARS2/ Flu > neg  Antimicrobials:  Blood culture 11/9 >> Urine culture 11/9 >>   Interim history/subjective:  Following all commands and moving extremities. No pain.  Tolerating 8/5 PS/CPAP well  Objective   Blood pressure (!) 186/105, pulse 93, temperature (!) 101.2 F (38.4 C), temperature source Axillary, resp. rate 16, height 5\' 9"  (1.753 m), weight 62.3 kg, SpO2 100 %.    Vent Mode: PRVC FiO2 (%):  [40 %] 40 % Set Rate:  [16 bmp] 16 bmp Vt Set:  [520 mL] 520 mL PEEP:  [5 cmH20-8 cmH20] 5 cmH20 Pressure  Support:  [8 cmH20] 8 cmH20 Plateau Pressure:  [15 cmH20-16 cmH20] 15 cmH20   Intake/Output Summary (Last 24 hours) at 02/05/2020 0648 Last data filed at 02/05/2020 0600 Gross per 24 hour  Intake 1206.97 ml  Output 2201 ml  Net -994.03 ml   Filed Weights   02/03/20 0500 02/04/20 0358 02/05/20 0500  Weight: 66.9 kg 65.7 kg 62.3 kg    Examination:  General: Middle-aged African-American female, lying in the bed, orally intubated HEENT: EVD in place, moist mucous membranes ETT and OGT in place Lungs: CTAB, no wheezing or rhonchi  Cardiovascular: RRR, no MRG  Abdomen: soft, non-distended, active bowel sounds  Skin: intact, no rash Neuro: prosthetic left eye, right eye with pupil briskly reactive to light.  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  Complicated with hydrocephalus status post EVD Acute encephalopathy due to subarachnoid hemorrhage Mentation improved since yesterday, following commands and moving all extremities.  - continue neuro checks - Appreciate neurosurgery input, recommend liberalizing blood pressure to systolic <384 - cleviprex gtt, clonidine .1 bid, losartan - EVD in place at level 10 H20, draining well - Continue nimodipine - Maintain euvolemia and normal natremia - slightly hypernatremic today and net -1L. Giving 1L LR bolus - continue keppra  - Daily TCD's - Avoid sedation  Febrile Leukocytosis Fever and leukocytosis possibly secondary to inflammation from [facility]. Continuing to spikes fevers.  -  blood culture and urine culture pending.   Acute respiratory insufficiency due to subarachnoid hemorrhage Patient is now tolerating pressure support 8/5, mental status improved yest. Unable to extubate yesterday due to negative cuff leak.  - cont. Decadron 4mg  bid    - Avoid sedation with RASS goal 0/-1 - Extubate today   Hypertensive emergency:  - Patient is on Cleviprex, losartan 100, clonidine .1 bid, labetalol prn - Cont permissive hypertension with systolic goal less than 076 with improved mentation with higher bp goal  Prediabetic with hyperglycemia:  a1c 6.2 Continue sliding scale insulin with goal fingerstick glucose 140-180  Mediastinal mass:  Follow-up as an outpatient   Best practice:  Diet: TF  Pain/Anxiety/Delirium protocol (if indicated): As needed propofol VAP protocol (if indicated): Yes DVT prophylaxis: scd GI prophylaxis: ppi Glucose control: monitoring Mobility: mobilize  Code Status: FULL Family Communication: Per primary team Disposition: ICU  Labs   CBC: Recent Labs  Lab 01/30/20 0427 01/30/20 0753 01/31/20 0450 02/01/20 0554 02/02/20 0421 02/04/20 0732 02/05/20 0244  WBC 10.3   < > 13.3* 22.7* 19.7* 15.8* 15.4*  NEUTROABS 7.9*  --   --   --   --   --   --   HGB 13.5   < > 11.6* 9.6* 9.9* 10.1* 10.1*  HCT 43.8   < > 36.7 30.1* 28.2* 32.6* 33.0*  MCV 82.5   < > 81.9 81.6 82.5 81.5 84.4  PLT 168   < > 114* 114* 113* 165 149*   < > = values in this interval not displayed.    Basic Metabolic Panel: Recent Labs  Lab 01/31/20 0450 01/31/20 0450 01/31/20 1710 01/31/20 1710 02/01/20 0554 02/01/20 1701 02/02/20 0424 02/02/20 2201 02/04/20 0732 02/05/20 0244  NA 141  --   --   --  140  --  139  --  149* 152*  K 3.9  --   --   --  3.1*  --  4.3  --  3.9 4.0  CL 112*  --   --   --  112*  --  111  --  112* 115*  CO2 15*  --   --   --  17*  --  21*  --  26 24  GLUCOSE 248*  --   --   --  215*  --  193*  --  133* 130*  BUN 23  --   --   --  25*  --  23  --  19 28*  CREATININE 0.90  --   --   --  0.74  --  0.61  --  0.53 0.62  CALCIUM 8.5*  --   --   --  8.4*  --  8.8*  --  10.0 10.2  MG 2.1   < > 2.1   < > 2.3 3.0* 2.7* 2.9* 2.9*  --   PHOS 3.0   < > 3.7  --  4.2  4.3 2.8 2.3*  --  2.8  --    < > =  values in this interval not displayed.   GFR: Estimated Creatinine Clearance: 71.7 mL/min (by C-G formula based on SCr of 0.62 mg/dL). Recent Labs  Lab 02/01/20 0554 02/02/20 0421 02/04/20 0732 02/05/20 0244  WBC 22.7* 19.7* 15.8* 15.4*    Liver Function Tests: Recent Labs  Lab 01/31/20 0450 02/01/20 0554 02/02/20 0424  ALBUMIN 3.3* 2.9* 2.7*   No results for input(s): LIPASE, AMYLASE in the last 168 hours. No  results for input(s): AMMONIA in the last 168 hours.  ABG    Component Value Date/Time   PHART 7.339 (L) 01/31/2020 0113   PCO2ART 40.4 01/31/2020 0113   PO2ART 231 (H) 01/31/2020 0113   HCO3 22.1 01/31/2020 0113   TCO2 23 01/31/2020 0113   ACIDBASEDEF 4.0 (H) 01/31/2020 0113   O2SAT 100.0 01/31/2020 0113     Coagulation Profile: Recent Labs  Lab 01/30/20 0859  INR 1.0    Cardiac Enzymes: No results for input(s): CKTOTAL, CKMB, CKMBINDEX, TROPONINI in the last 168 hours.  HbA1C: Hgb A1c MFr Bld  Date/Time Value Ref Range Status  01/30/2020 08:59 AM 6.2 (H) 4.8 - 5.6 % Final    Comment:    (NOTE) Pre diabetes:          5.7%-6.4%  Diabetes:              >6.4%  Glycemic control for   <7.0% adults with diabetes     CBG: Recent Labs  Lab 02/04/20 1148 02/04/20 1556 02/04/20 1939 02/04/20 2325 02/05/20 0337  GLUCAP 153* 133* 149* 137* 131*    Ethlyn Alto A, DO 02/05/2020, 6:48 AM Pager: [phone]

## 2020-02-05 NOTE — Procedures (Addendum)
Extubation Procedure Note  Patient Details:   Name: Litzi Binning DOB: 1956/01/01 MRN: [id number]   Airway Documentation:    Vent end date: 02/05/20 Vent end time: 0955   Evaluation  O2 sats: stable throughout Complications: Complications of stridor immediately following extubation.  Patient did not tolerate procedure well. Bilateral Breath Sounds: (S) Stridor (Dr. Tacy Learn called to the bedside. )   Yes   Patient was extubated to a 5L [place] & immediately developed stridor. Dr. Tacy Learn was called to the bedside. Racemic Epi was given without improvement. Dr. Tacy Learn is currently at the bedside now assessing the patient. Patient did have a positive cuff leak prior to extubation.   Dylynn Ketner, Eddie North 02/05/2020, 9:55 AM

## 2020-02-05 NOTE — Progress Notes (Signed)
  NEUROSURGERY PROGRESS NOTE   Pt seen and examined. No issues overnight.   EXAM: Temp:  [99 F (37.2 C)-101.2 F (38.4 C)] 99 F (37.2 C) (11/09 0800) Pulse Rate:  [79-124] 79 (11/09 0800) Resp:  [11-24] 11 (11/09 0800) BP: (167-237)/(77-152) 207/124 (11/09 0800) SpO2:  [99 %-100 %] 100 % (11/09 0800) FiO2 (%):  [40 %] 40 % (11/09 0408) Weight:  [62.3 kg] 62.3 kg (11/09 0500) Intake/Output      11/08 0701 - 11/09 0700 11/09 0701 - 11/10 0700   I.V. (mL/kg) 67 (1.1) 0 (0)   NG/GT 860    IV Piggyback 300 100   Total Intake(mL/kg) 1227 (19.7) 100 (1.6)   Urine (mL/kg/hr) 1950 (1.3)    Drains 257 27   Stool 0    Total Output 2207 27   Net -980 +73        Urine Occurrence 3 x    Stool Occurrence 4 x     Awake, alert Nods appropriately to questions, mouths responses Breathing spontaneously Good strength RUE/RLE > Antigravity LUE/LLE Wound c/d/i  LABS: Lab Results  Component Value Date   CREATININE 0.62 02/05/2020   BUN 28 (H) 02/05/2020   NA 152 (H) 02/05/2020   K 4.0 02/05/2020   CL 115 (H) 02/05/2020   CO2 24 02/05/2020   Lab Results  Component Value Date   WBC 15.4 (H) 02/05/2020   HGB 10.1 (L) 02/05/2020   HCT 33.0 (L) 02/05/2020   MCV 84.4 02/05/2020   PLT 149 (L) 02/05/2020    IMPRESSION: - 64 y.o. female POD# 6 SAH# 6 s/p clipping LPICA, appears to be improving neurologically.  PLAN: - Extubate per PCCM - SBP up to 282mmHg okay - Keep EVD open @ 59mmHg - Cont Nimotop - PT/OT re-eval

## 2020-02-06 ENCOUNTER — Inpatient Hospital Stay (HOSPITAL_COMMUNITY): Payer: Self-pay

## 2020-02-06 DIAGNOSIS — I609 Nontraumatic subarachnoid hemorrhage, unspecified: Secondary | ICD-10-CM

## 2020-02-06 LAB — POCT I-STAT 7, (LYTES, BLD GAS, ICA,H+H)
Acid-Base Excess: 5 mmol/L — ABNORMAL HIGH (ref 0.0–2.0)
Bicarbonate: 29.2 mmol/L — ABNORMAL HIGH (ref 20.0–28.0)
Calcium, Ion: 1.38 mmol/L (ref 1.15–1.40)
HCT: 29 % — ABNORMAL LOW (ref 36.0–46.0)
Hemoglobin: 9.9 g/dL — ABNORMAL LOW (ref 12.0–15.0)
O2 Saturation: 99 %
Patient temperature: 99.6
Potassium: 3.3 mmol/L — ABNORMAL LOW (ref 3.5–5.1)
Sodium: 157 mmol/L — ABNORMAL HIGH (ref 135–145)
TCO2: 30 mmol/L (ref 22–32)
pCO2 arterial: 42.8 mmHg (ref 32.0–48.0)
pH, Arterial: 7.444 (ref 7.350–7.450)
pO2, Arterial: 157 mmHg — ABNORMAL HIGH (ref 83.0–108.0)

## 2020-02-06 LAB — GLUCOSE, CAPILLARY
Glucose-Capillary: 160 mg/dL — ABNORMAL HIGH (ref 70–99)
Glucose-Capillary: 158 mg/dL — ABNORMAL HIGH (ref 70–99)
Glucose-Capillary: 170 mg/dL — ABNORMAL HIGH (ref 70–99)
Glucose-Capillary: 87 mg/dL (ref 70–99)
Glucose-Capillary: 143 mg/dL — ABNORMAL HIGH (ref 70–99)
Glucose-Capillary: 169 mg/dL — ABNORMAL HIGH (ref 70–99)
Glucose-Capillary: 208 mg/dL — ABNORMAL HIGH (ref 70–99)

## 2020-02-06 LAB — CBC WITH DIFFERENTIAL/PLATELET
Abs Immature Granulocytes: 0.17 10*3/uL — ABNORMAL HIGH (ref 0.00–0.07)
Basophils Absolute: 0 10*3/uL (ref 0.0–0.1)
Basophils Relative: 0 %
Eosinophils Absolute: 0 10*3/uL (ref 0.0–0.5)
Eosinophils Relative: 0 %
HCT: 30.4 % — ABNORMAL LOW (ref 36.0–46.0)
Hemoglobin: 9.3 g/dL — ABNORMAL LOW (ref 12.0–15.0)
Immature Granulocytes: 1 %
Lymphocytes Relative: 8 %
Lymphs Abs: 1 10*3/uL (ref 0.7–4.0)
MCH: 25.5 pg — ABNORMAL LOW (ref 26.0–34.0)
MCHC: 30.6 g/dL (ref 30.0–36.0)
MCV: 83.5 fL (ref 80.0–100.0)
Monocytes Absolute: 1 10*3/uL (ref 0.1–1.0)
Monocytes Relative: 8 %
Neutro Abs: 10.8 10*3/uL — ABNORMAL HIGH (ref 1.7–7.7)
Neutrophils Relative %: 83 %
Platelets: 163 10*3/uL (ref 150–400)
RBC: 3.64 MIL/uL — ABNORMAL LOW (ref 3.87–5.11)
RDW: 15.5 % (ref 11.5–15.5)
WBC: 13 10*3/uL — ABNORMAL HIGH (ref 4.0–10.5)
nRBC: 0.2 % (ref 0.0–0.2)

## 2020-02-06 LAB — BASIC METABOLIC PANEL
Anion gap: 11 (ref 5–15)
BUN: 38 mg/dL — ABNORMAL HIGH (ref 8–23)
CO2: 25 mmol/L (ref 22–32)
Calcium: 10.2 mg/dL (ref 8.9–10.3)
Chloride: 118 mmol/L — ABNORMAL HIGH (ref 98–111)
Creatinine, Ser: 0.7 mg/dL (ref 0.44–1.00)
GFR, Estimated: >60 mL/min (ref >60)
Glucose, Bld: 182 mg/dL — ABNORMAL HIGH (ref 70–99)
Potassium: 3.4 mmol/L — ABNORMAL LOW (ref 3.5–5.1)
Sodium: 154 mmol/L — ABNORMAL HIGH (ref 135–145)

## 2020-02-06 LAB — CULTURE, RESPIRATORY W GRAM STAIN: Culture: NO GROWTH

## 2020-02-06 IMAGING — US US THYROID
1 series · 13 of 25 positions shown · non-contrast
Comparison: None.

CLINICAL DATA: Left thyroid mass

EXAM:
THYROID ULTRASOUND
TECHNIQUE: Ultrasound examination of the thyroid gland and adjacent soft
tissues was performed.

[Series 1: us thyroid · 42 acquisitions, 13 frames shown]
[im 1/42]
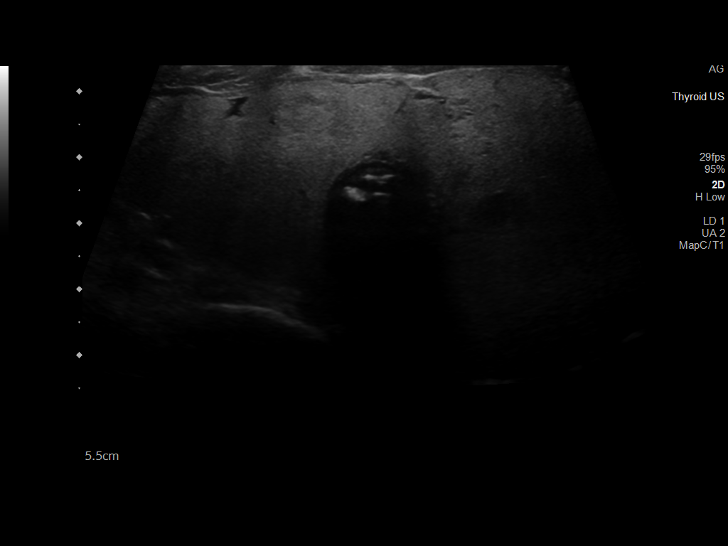
[im 4/42]
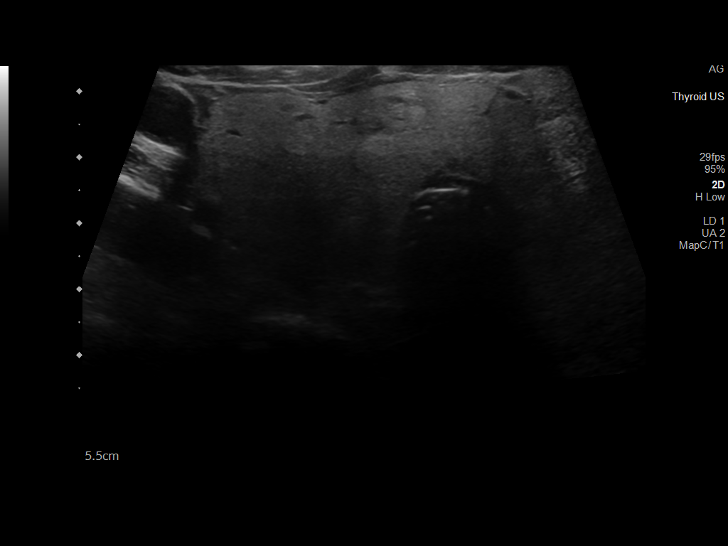
[im 7/42]
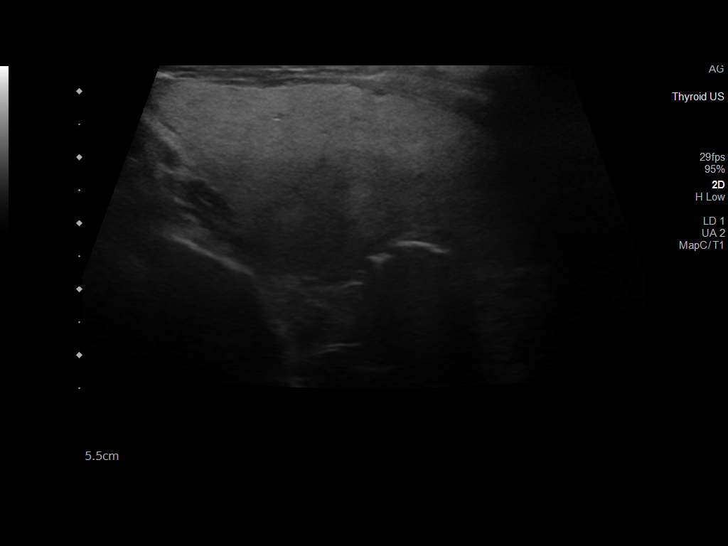
[im 11/42]
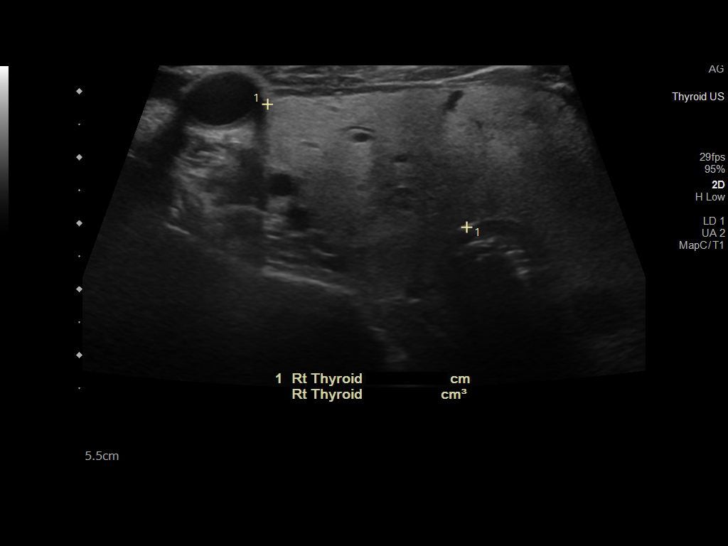
[im 14/42]
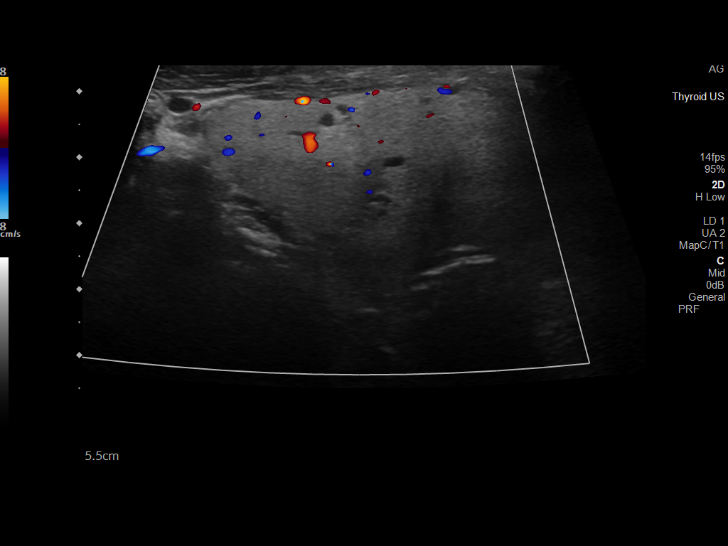
[im 18/42]
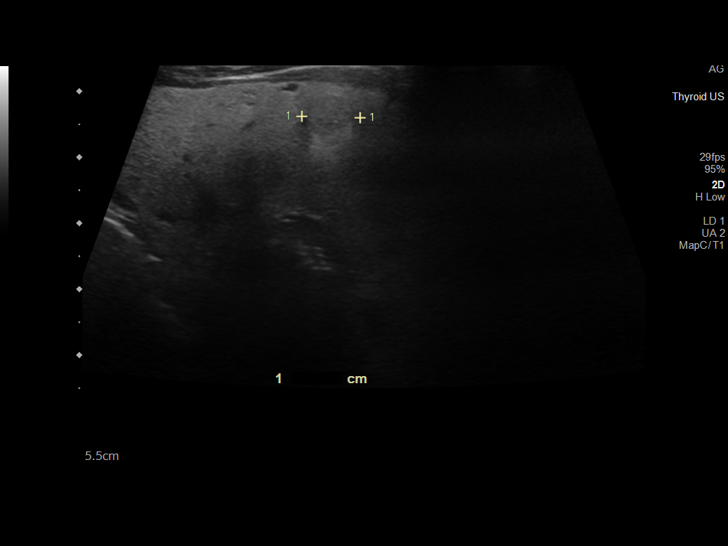
[im 21/42]
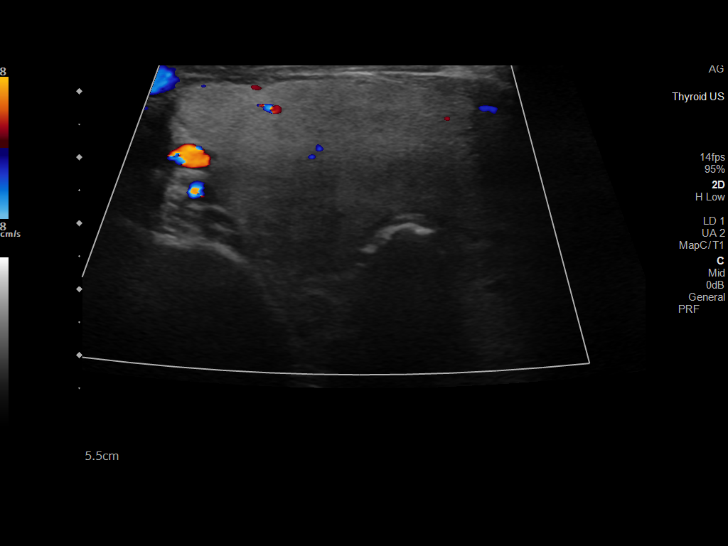
[im 24/42]
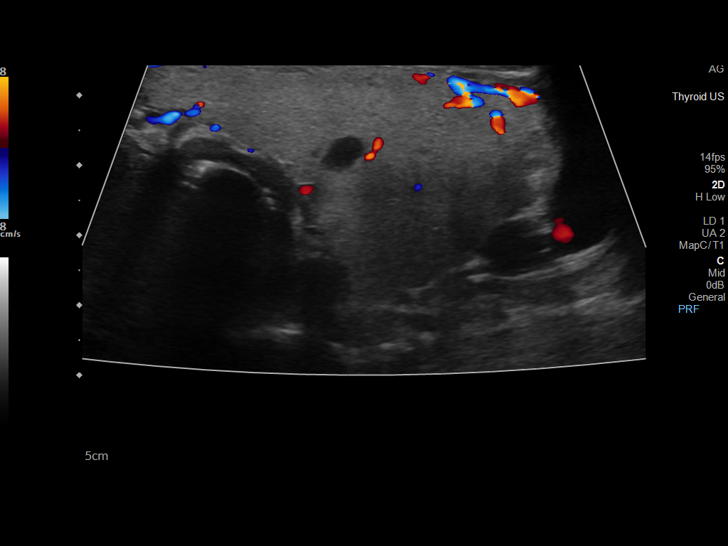
[im 28/42]
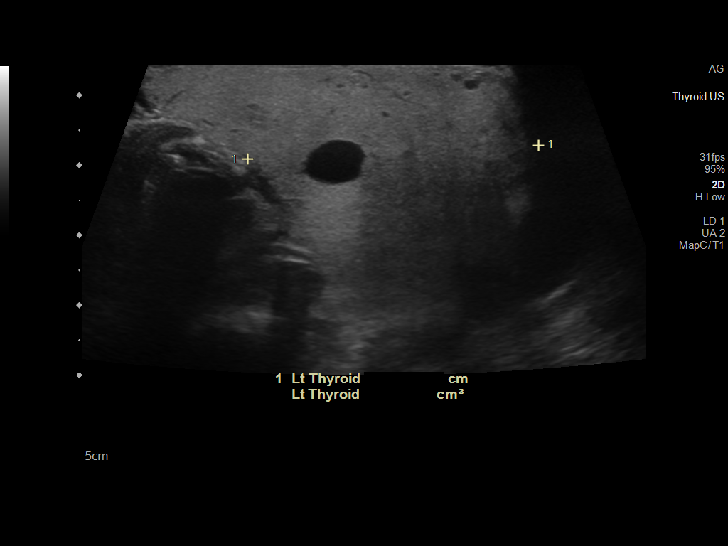
[im 31/42]
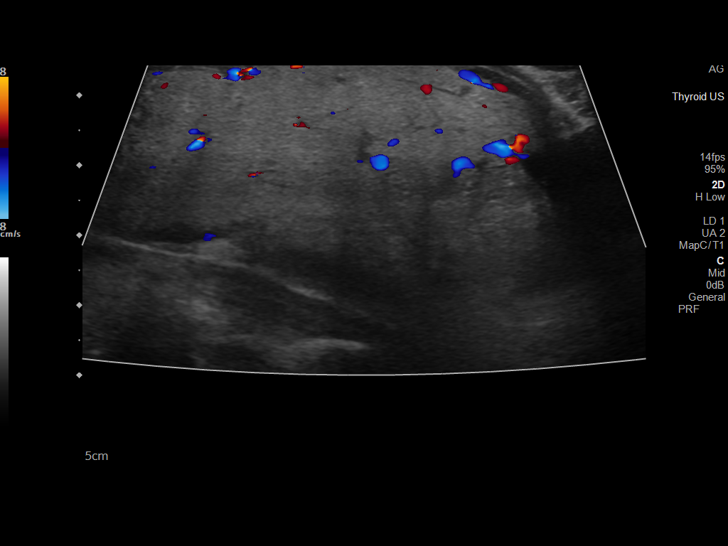
[im 35/42]
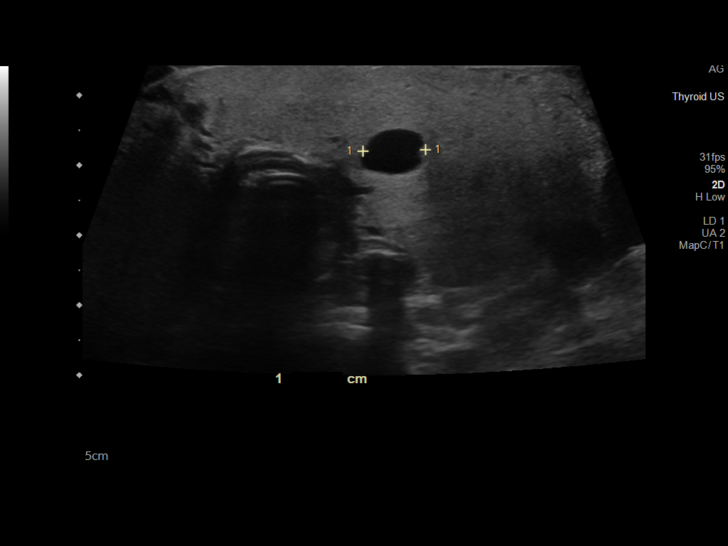
[im 38/42]
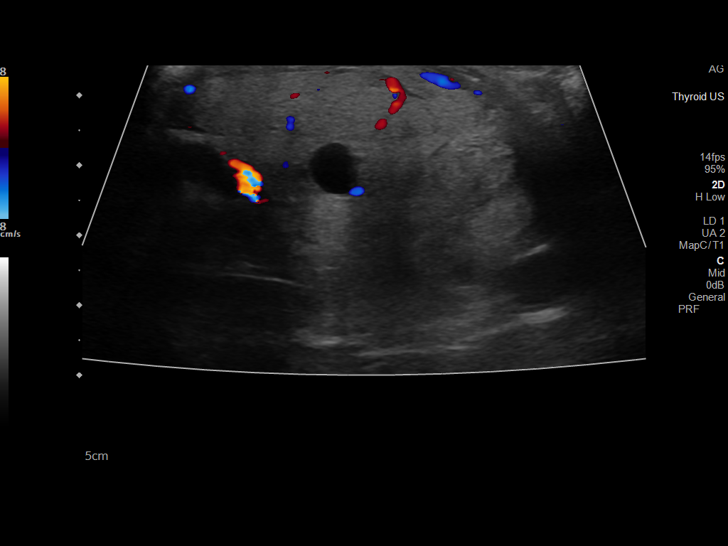
[im 42/42]
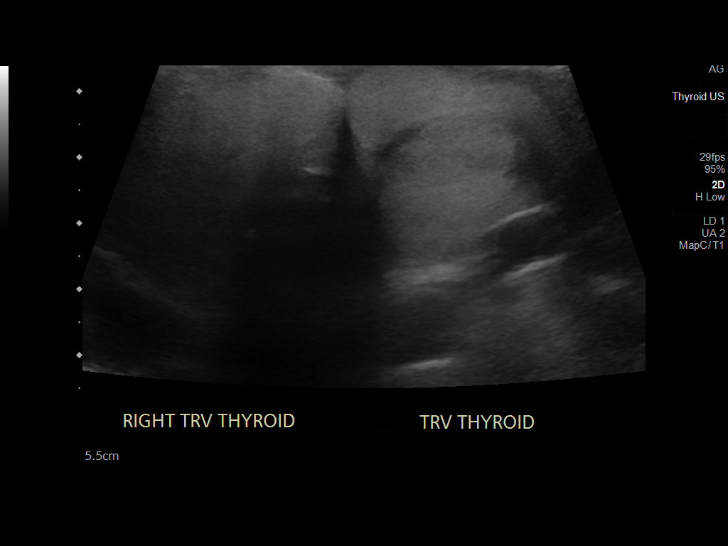

[13 of 25 positions shown; findings below may reference images not displayed]

FINDINGS: Parenchymal Echotexture: Mildly heterogenous

Isthmus: 1.3 cm

Right lobe: 6.9 x 3.3 x 3 cm

Left lobe: 7.6 x 3.2 x 4.2 cm

_________________________________________________________

Estimated total number of nodules >/= 1 cm: 2

Number of spongiform nodules >/=  2 cm not described below (TR1): 0

Number of mixed cystic and solid nodules >/= 1.5 cm not described
below (TR2): 0

_________________________________________________________

Small cystic nodules are noted in the right thyroid gland measuring
up to approximately 4 mm. There is an isoechoic ill-defined 1 cm
thyroid nodule in the isthmus that does not meet criteria for
follow-up or fine-needle aspiration.

Nodule # 1:

Location: Left; Inferior

Maximum size: 3.4 cm; Other 2 dimensions: 2.5 x 2.4 cm

Composition: solid/almost completely solid (2)

Echogenicity: isoechoic (1)

Shape: not taller-than-wide (0)

Margins: ill-defined (0)

Echogenic foci: none (0)

ACR TI-RADS total points: 3.

ACR TI-RADS risk category: TR3 (3 points).

ACR TI-RADS recommendations:

**Given size (>/= 2.5 cm) and appearance, fine needle aspiration of
this mildly suspicious nodule should be considered based on TI-RADS
criteria.

_________________________________________________________

There is an additional 9 mm cystic nodule in the left inferior
thyroid gland that does not meet criteria for follow-up.
IMPRESSION: 1. Multinodular goiter as detailed above.
2. There is a 3.4 cm TR 3 thyroid nodule in the left inferior
thyroid gland. Fine-needle aspiration is recommended for this
thyroid nodule.

The above is in keeping with the ACR TI-RADS recommendations - [HOSPITAL] [ST];[DATE].

## 2020-02-06 MED ORDER — LACTATED RINGERS IV BOLUS
1000.0000 mL | Freq: Once | INTRAVENOUS | Status: AC
  Administered 2020-02-06: 1000 mL via INTRAVENOUS

## 2020-02-06 MED ORDER — OSMOLITE 1.2 CAL PO LIQD
1000.0000 mL | ORAL | Status: DC
  Administered 2020-02-06 – 2020-02-13 (×8): 1000 mL
  Filled 2020-02-06 (×8): qty 1000

## 2020-02-06 MED ORDER — POTASSIUM CHLORIDE 20 MEQ/15ML (10%) PO SOLN
40.0000 meq | Freq: Once | ORAL | Status: AC
  Administered 2020-02-06: 40 meq via ORAL
  Filled 2020-02-06: qty 30

## 2020-02-06 MED ORDER — PROSOURCE TF PO LIQD
45.0000 mL | Freq: Two times a day (BID) | ORAL | Status: DC
  Administered 2020-02-06 – 2020-04-22 (×136): 45 mL
  Filled 2020-02-06 (×138): qty 45

## 2020-02-06 NOTE — Progress Notes (Signed)
Nutrition Follow-up  DOCUMENTATION CODES:   Not applicable  INTERVENTION:   Tube feeding via OG tube: Osmolite 1.2 at 60 ml/h (1440 ml per day) Prosource TF 45 ml BID  Provides 1819 kcal, 101 gm protein, 1167 ml free water daily   NUTRITION DIAGNOSIS:   Inadequate oral intake related to acute illness as evidenced by estimated needs.  Ongoing.   GOAL:   Patient will meet greater than or equal to 90% of their needs  Progressing.   MONITOR:   Vent status, Labs, Weight trends, Skin, I & O's  REASON FOR ASSESSMENT:   Ventilator    ASSESSMENT:   64 year old female who presented to the ED on 11/03 with headache. 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 Pt required intubation for airway protection.  Pt discussed during ICU rounds and with RN.  Per MD failed extubation due to mediastinal/thyroid mass compressing trachea - surgery consulted  Cleviprex and Propofol are currently off. Pt with neurogenic fevers.   11/03 Admitted 11/04 SAH, L PICA aneurysm to OR for craniotomy, clipping  11/09 extubated but immediately re-intubated  Patient is currently intubated on ventilator support MV: 9.3 L/min Temp (24hrs), Avg:99 F (37.2 C), Min:97.6 F (36.4 C), Max:102.7 F (39.3 C)  EVD: 207 ml  OG tube in good position per xray   Medications reviewed and include: colace Labs reviewed: Na 157, K+ 3.3  Current TF: Vital High Protein @ 20 ml/hr with 45 ml ProSource TF five times per day  Provides: 680 kcal and 97 grams protein  Diet Order:   Diet Order            Diet NPO time specified  Diet effective now                 EDUCATION NEEDS:   No education needs have been identified at this time  Skin:  Skin Assessment: Reviewed RN Assessment  Last BM:  11/9 type 7  Height:   Ht Readings from Last 1 Encounters:  01/30/20 5\' 9"  (1.753 m)    Weight:   Wt Readings from Last 1 Encounters:  02/06/20 59.8  kg    Ideal Body Weight:  65.9 kg  BMI:  Body mass index is 19.47 kg/m.  Estimated Nutritional Needs:   Kcal:  1817  Protein:  95-110 grams  Fluid:  >/= 1.6 L  Artha Stavros P., RD, LDN, CNSC See AMiON for contact information

## 2020-02-06 NOTE — Progress Notes (Signed)
  NEUROSURGERY PROGRESS NOTE   No issues overnight, pt was extubated yesterday am with significant stridor requiring re-intubation. Otherwise remained neurologically stable  EXAM:  BP (!) 192/104 (BP Location: Left Arm)   Pulse 66   Temp 99.6 F (37.6 C) (Axillary)   Resp 16   Ht 5\' 9"  (1.753 m)   Wt 59.8 kg   SpO2 100%   BMI 19.47 kg/m   Currently sedated on fentanyl, per RN prior to meds Awake, alert Nodding appropriately  5/5 RUE/RLE >4/5 LUE/LLE Wound c/d/i  IMPRESSION:  64 y.o. female POD# 7, SAH# 7 s/p clipping LPICA aneurysm, appears neurologically stable. Neck/mediastinal mass may contribute to airway compromise after extubation  PLAN: - Spoke with PCCM, will plan on surgical consult for eval of neck/mediastinal mass. Can then decide on another trial of extubation however with likelihood of some mild unilateral lower cranial nerve dysfunction, would have low threshold for trach. - Cont Nimotop - TCD today - Maintain drain at current level (75mmHg) - Cont SBP parameter (up to 220mmHg)

## 2020-02-06 NOTE — Progress Notes (Signed)
NAME:  Stacy Moran, MRN:  [id number], DOB:  October 06, 1955, LOS: 0 ADMISSION DATE:  01/30/2020, CONSULTATION DATE:  01/30/2020 REFERRING MD:  Ferne Reus, PA, CHIEF COMPLAINT:  Headache  Brief History   75 yoF presenting with sudden onset of worst headache of her life.  BP 262/123.  CT/ CTA showed large volume SAH secondary to ruptured PICA with resultant hydrocephalus. Progressive decline in mental status in ER requiring intubation for airway protection.    Past Medical History  No known past medical history   Significant Hospital Events   11/3 Admitted 11/4 craniotomy with clipping of left PICA  Consults:  PCCM  Procedures:  11/3 ETT >>11/9 11/3 EVD >> 11/9 ETT >>  Significant Diagnostic Tests:  11/3 CTH > 1. 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 2. Intraventricular reflux with hydrocephalus. 3. Chronic small vessel disease. 11/3 CTA head/ neck > 1. Ruptured 3 mm left PICA aneurysm. 2. The left vertebral artery is dominant and diffusely patent. Moderate right V4 segment stenosis. 3. 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 After recovery, recommend chest CT with contrast. 4. No stenosis or significant atherosclerosis in the neck. 5. 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  11/9 H&N [place] 2.3 x 2.3 x 2.4 cm mass in the left neck along the inferior aspect of the left thyroid lobe which appears to be arising from the thyroid gland. Color images demonstrate some flow within this mass. No drainable fluid collection or abscess identified. Likely represents a thyroid nodule   Micro Data:  11/3 SARS2/ Flu > neg  Antimicrobials:  Blood culture 11/9 >> Urine culture 11/9 >>   Interim history/subjective:  Following all commands and moving extremities. Insufficient minute ventilation on pressure  support.  Objective   Blood pressure (!) 174/90, pulse 74, temperature 98 F (36.7 C), resp. rate 16, height 5\' 9"  (1.753 m), weight 59.8 kg, SpO2 100 %.    Vent Mode: PRVC FiO2 (%):  [40 %-50 %] 50 % Set Rate:  [16 bmp] 16 bmp Vt Set:  [520 mL] 520 mL PEEP:  [5 cmH20-8 cmH20] 8 cmH20 Pressure Support:  [8 cmH20] 8 cmH20 Plateau Pressure:  [18 cmH20-20 cmH20] 18 cmH20   Intake/Output Summary (Last 24 hours) at 02/06/2020 3710 Last data filed at 02/06/2020 0600 Gross per 24 hour  Intake 1254.19 ml  Output 1833 ml  Net -578.81 ml   Filed Weights   02/04/20 0358 02/05/20 0500 02/06/20 0500  Weight: 65.7 kg 62.3 kg 59.8 kg    Examination:  General: Middle-aged African-American female, lying in the bed, orally intubated HEENT: EVD in place, moist mucous membranes ETT and OGT in place; diffuse, soft anterior thyroid mass with more swelling on the left  Lungs: CTAB, no wheezing or rhonchi  Cardiovascular: RRR, no MRG  Abdomen: soft, non-distended, active bowel sounds  Skin: intact, no rash Neuro: prosthetic left eye, right eye with pupil briskly reactive to light.  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  Complicated with hydrocephalus status post EVD Acute encephalopathy due to subarachnoid hemorrhage Mentation improved since yesterday, following commands and moving all extremities.  - continue neuro checks - Appreciate neurosurgery input, recommend liberalizing blood pressure to systolic <  220 - blood pressure 147W systolic overnight, d/c cleviprex gtt, d/c clonidine .1 bid - Cont. losartan - EVD in place at level 10 H20, draining well - Continue nimodipine - Maintain euvolemia and normal natremia - hypernatremic today and net -5L. Giving additional 1L LR bolus - stop keppra,  completed 7 days - Daily TCD's - Avoid sedation  Febrile Leukocytosis Fever and leukocytosis possibly secondary to inflammation from [facility]. Continuing to spikes fevers. Respiratory cultures 11/8 with rare g+ cocci, likely normal flora.  - blood culture and urine culture pending.    Acute respiratory insufficiency due to subarachnoid hemorrhage Thyroid Mass  Required reintubation yesterday due to stridor with diffuse thyroid mass noted on exam, [place] and CT. Prior negative cuff leak the day before. Decreased MV on PS/CPAP today. She may end up requiring trach at this point, unfortunately.  -discuss trach with general surgery -dedicated thyroid US ordered.  -cont MV with 4-8 cc/kg IBW with goal Pplat <30 and DP <15 -daily SBT -cont. Decadron 4mg  bid   -Avoid sedation with RASS goal 0/-1.   Hypertensive emergency:  - Patient is on Cleviprex, losartan 100, clonidine .1 bid, labetalol prn - Cont permissive hypertension with systolic goal less than 295 with improved mentation with higher bp goal - stop clonidine and cleviprex with bp systolic 621H overnight.   Hypernatremia Hypokalemia Na more elevated today. Likely secondary to lactulose.  - stop lactulose - 1L LR bolus  - give K - am Mg  Prediabetic with hyperglycemia:  a1c 6.2 Continue sliding scale insulin with goal fingerstick glucose 140-180   Best practice:  Diet: TF  Pain/Anxiety/Delirium protocol (if indicated): As needed propofol VAP protocol (if indicated): Yes DVT prophylaxis: scd GI prophylaxis: ppi Glucose control: monitoring Mobility: mobilize  Code Status: FULL Family Communication: Per primary team Disposition: ICU  Labs   CBC: Recent Labs  Lab 02/01/20 0554 02/01/20 0554 02/02/20 0421 02/04/20 0732 02/05/20 0244 02/05/20 1152 02/06/20 0056  WBC 22.7*  --  19.7* 15.8* 15.4*  --  13.0*  NEUTROABS  --   --   --   --   --   --  10.8*  HGB 9.6*   < > 9.9* 10.1* 10.1* 9.5* 9.3*  HCT 30.1*   < > 28.2*  32.6* 33.0* 28.0* 30.4*  MCV 81.6  --  82.5 81.5 84.4  --  83.5  PLT 114*  --  113* 165 149*  --  163   < > = values in this interval not displayed.    Basic Metabolic Panel: Recent Labs  Lab 01/31/20 0450 01/31/20 1710 02/01/20 0554 02/01/20 0554 02/01/20 1701 02/02/20 0424 02/02/20 2201 02/04/20 0732 02/05/20 0244 02/05/20 1152 02/06/20 0056  NA   < >  --  140   < >  --  139  --  149* 152* 155* 154*  K   < >  --  3.1*   < >  --  4.3  --  3.9 4.0 3.6 3.4*  CL   < >  --  112*  --   --  111  --  112* 115*  --  118*  CO2   < >  --  17*  --   --  21*  --  26 24  --  25  GLUCOSE   < >  --  215*  --   --  193*  --  133* 130*  --  182*  BUN   < >  --  25*  --   --  23  --  19 28*  --  38*  CREATININE   < >  --  0.74  --   --  0.61  --  0.53 0.62  --  0.70  CALCIUM   < >  --  8.4*  --   --  8.8*  --  10.0 10.2  --  10.2  MG   < > 2.1 2.3  --  3.0* 2.7* 2.9* 2.9*  --   --   --   PHOS  --  3.7 4.2  4.3  --  2.8 2.3*  --  2.8  --   --   --    < > = values in this interval not displayed.   GFR: Estimated Creatinine Clearance: 68.8 mL/min (by C-G formula based on SCr of 0.7 mg/dL). Recent Labs  Lab 02/02/20 0421 02/04/20 0732 02/05/20 0244 02/06/20 0056  WBC 19.7* 15.8* 15.4* 13.0*    Liver Function Tests: Recent Labs  Lab 01/31/20 0450 02/01/20 0554 02/02/20 0424  ALBUMIN 3.3* 2.9* 2.7*   No results for input(s): LIPASE, AMYLASE in the last 168 hours. No results for input(s): AMMONIA in the last 168 hours.  ABG    Component Value Date/Time   PHART 7.487 (H) 02/05/2020 1152   PCO2ART 41.4 02/05/2020 1152   PO2ART 59 (L) 02/05/2020 1152   HCO3 31.3 (H) 02/05/2020 1152   TCO2 33 (H) 02/05/2020 1152   ACIDBASEDEF 4.0 (H) 01/31/2020 0113   O2SAT 92.0 02/05/2020 1152     Coagulation Profile: Recent Labs  Lab 01/30/20 0859  INR 1.0    Cardiac Enzymes: No results for input(s): CKTOTAL, CKMB, CKMBINDEX, TROPONINI in the last 168 hours.  HbA1C: Hgb A1c MFr Bld   Date/Time Value Ref Range Status  01/30/2020 08:59 AM 6.2 (H) 4.8 - 5.6 % Final    Comment:    (NOTE) Pre diabetes:          5.7%-6.4%  Diabetes:              >6.4%  Glycemic control for   <7.0% adults with diabetes     CBG: Recent Labs  Lab 02/05/20 1141 02/05/20 1605 02/05/20 2041 02/06/20 0102 02/06/20 0424  GLUCAP 113* 131* 149* 160* 158*    Britnie Colville A, DO 02/06/2020, 6:37 AM Pager: [phone]

## 2020-02-06 NOTE — Progress Notes (Signed)
Transcranial Doppler  Date POD PCO2 HCT BP  MCA ACA PCA OPHT SIPH VERT Basilar  11/4 RH     Right  Left   *  *   *  *   *  *   20  16   86  23   *  21   *      11/5,rs     Right  Left   *  *   *  *   *  *   23  19   99  28   *     *      11/8/ GC     Right  Left   *  *   *  *   *  *   12  10   31   *   *     *       11/10 MS     Right  Left   *  *   *  *   *  *   17  *   20  *   *  *   *            Right  Left                                            Right  Left                                            Right  Left                                        MCA = Middle Cerebral Artery      OPHT = Opthalmic Artery     BASILAR = Basilar Artery   ACA = Anterior Cerebral Artery     SIPH = Carotid Siphon PCA = Posterior Cerebral Artery   VERT = Verterbral Artery                 *- Unable to insonate   Normal MCA = 62+\-12 ACA = 50+\-12 PCA = 42+\-23    02/01/20 - (*) unable to insonate - poor windows. Rita Sturdivant, BS, RDMS, RVT  02/04/2020 - * Unable to insonate.  02/06/20- *Unable to insonate  02/06/2020 1:54 PM Kelby Aline., MHA, RVT, RDCS, RDMS

## 2020-02-07 DIAGNOSIS — I607 Nontraumatic subarachnoid hemorrhage from unspecified intracranial artery: Secondary | ICD-10-CM

## 2020-02-07 LAB — CBC
HCT: 32.9 % — ABNORMAL LOW (ref 36.0–46.0)
Hemoglobin: 9.9 g/dL — ABNORMAL LOW (ref 12.0–15.0)
MCH: 25.9 pg — ABNORMAL LOW (ref 26.0–34.0)
MCHC: 30.1 g/dL (ref 30.0–36.0)
MCV: 86.1 fL (ref 80.0–100.0)
Platelets: 188 10*3/uL (ref 150–400)
RBC: 3.82 MIL/uL — ABNORMAL LOW (ref 3.87–5.11)
RDW: 15.9 % — ABNORMAL HIGH (ref 11.5–15.5)
WBC: 15.6 10*3/uL — ABNORMAL HIGH (ref 4.0–10.5)
nRBC: 0.2 % (ref 0.0–0.2)

## 2020-02-07 LAB — BASIC METABOLIC PANEL
Anion gap: 12 (ref 5–15)
BUN: 44 mg/dL — ABNORMAL HIGH (ref 8–23)
CO2: 22 mmol/L (ref 22–32)
Calcium: 9.9 mg/dL (ref 8.9–10.3)
Chloride: 121 mmol/L — ABNORMAL HIGH (ref 98–111)
Creatinine, Ser: 0.7 mg/dL (ref 0.44–1.00)
GFR, Estimated: >60 mL/min (ref >60)
Glucose, Bld: 166 mg/dL — ABNORMAL HIGH (ref 70–99)
Potassium: 3.6 mmol/L (ref 3.5–5.1)
Sodium: 155 mmol/L — ABNORMAL HIGH (ref 135–145)

## 2020-02-07 LAB — URINALYSIS, ROUTINE W REFLEX MICROSCOPIC
Bilirubin Urine: NEGATIVE
Glucose, UA: NEGATIVE mg/dL
Hgb urine dipstick: NEGATIVE
Ketones, ur: NEGATIVE mg/dL
Leukocytes,Ua: NEGATIVE
Nitrite: NEGATIVE
Protein, ur: NEGATIVE mg/dL
Specific Gravity, Urine: 1.024 (ref 1.005–1.030)
pH: 5 (ref 5.0–8.0)

## 2020-02-07 LAB — GLUCOSE, CAPILLARY
Glucose-Capillary: 134 mg/dL — ABNORMAL HIGH (ref 70–99)
Glucose-Capillary: 135 mg/dL — ABNORMAL HIGH (ref 70–99)
Glucose-Capillary: 140 mg/dL — ABNORMAL HIGH (ref 70–99)
Glucose-Capillary: 147 mg/dL — ABNORMAL HIGH (ref 70–99)
Glucose-Capillary: 159 mg/dL — ABNORMAL HIGH (ref 70–99)
Glucose-Capillary: 189 mg/dL — ABNORMAL HIGH (ref 70–99)

## 2020-02-07 LAB — TSH: TSH: 0.118 u[IU]/mL — ABNORMAL LOW (ref 0.350–4.500)

## 2020-02-07 LAB — MAGNESIUM: Magnesium: 2.6 mg/dL — ABNORMAL HIGH (ref 1.7–2.4)

## 2020-02-07 LAB — OSMOLALITY, URINE: Osmolality, Ur: 748 mOsm/kg (ref 300–900)

## 2020-02-07 MED ORDER — FREE WATER
200.0000 mL | Freq: Four times a day (QID) | Status: DC
  Administered 2020-02-07 – 2020-02-08 (×3): 200 mL

## 2020-02-07 MED ORDER — BETHANECHOL CHLORIDE 10 MG PO TABS
10.0000 mg | ORAL_TABLET | Freq: Three times a day (TID) | ORAL | Status: DC
  Administered 2020-02-07 – 2020-02-13 (×18): 10 mg
  Filled 2020-02-07 (×18): qty 1

## 2020-02-07 NOTE — Progress Notes (Signed)
  NEUROSURGERY PROGRESS NOTE   No issues overnight.   EXAM:  BP (!) 170/78   Pulse 89   Temp 98.4 F (36.9 C) (Axillary)   Resp 20   Ht 5\' 9"  (1.753 m)   Wt 60.6 kg   SpO2 100%   BMI 19.73 kg/m   Awake, alert Nods appropriately to questions Breathing spontaneously Good strength RUE/RLE 4/5 LUE/LLE Wound c/d/i  IMPRESSION:  64 y.o. female POD# 8 SAH d# 8 s/p clipping LPICA aneurysm, neurologically stable. Failed extubation 2d ago with large thyroid and likelihood of some lower cranial dysfunction will require trach  PLAN: - Cont supportive care - Cont Nimotop - Keep EVD open @ 27mmHg, can begin to wean after trach - TCD tomorro - ENT eval for trach

## 2020-02-07 NOTE — Progress Notes (Signed)
Physical Therapy Treatment Patient Details Name: Stacy Moran MRN: [id number] DOB: [birth year] Today's Date: 02/07/2020    History of Present Illness pt is a 64 y/o female with no known PHX admitted with sudden onset of HA, BP 262/123, Imaging showing large volume SAHdue to a ruptured L PICA aneurysm associated with intraventricular hydrocephalus.  11/4, pt s/p L far-lateral craniotomy for clipping of PICA aneurysm.    PT Comments    RN initially reluctant to have therapy work with pt.  Pt restless and determined to pull out trach.  Emphasis on bed level session stressing resisted exercise upper and LE big muscle groups bilaterally.  Plan is for surgery tomorrow for ?excision of goiter and trach placement. Will see post surgery for mobility assess as able.  Left with pt in 2 point soft wrist retraints and mitts for safety.   Follow Up Recommendations  SNF;Other (comment) (working toward being approp for [employer])     Equipment Recommendations  Other (comment) (TBA)    Recommendations for Other Services       Precautions / Restrictions Precautions Precautions: Fall    Mobility  Bed Mobility                  Transfers                 General transfer comment: unable per RN  Ambulation/Gait                 Stairs             Wheelchair Mobility    Modified Rankin (Stroke Patients Only) Modified Rankin (Stroke Patients Only) Pre-Morbid Rankin Score: No symptoms Modified Rankin: Severe disability     Balance                                            Cognition Arousal/Alertness: Awake/alert Behavior During Therapy: Restless Overall Cognitive Status: Difficult to assess                                 General Comments: pt followed simple 1 step commands during exercise program      Exercises General Exercises - Lower Extremity Heel Slides: AROM;Strengthening;Both;10 reps;Supine;Other (comment) (resisted  flexion and extension) Hip ABduction/ADduction: AROM;Both;15 reps;Supine Other Exercises Other Exercises: resisted bicep/tricep presses with resisted concentric/eccentric shd flex/ext (reaches to the ceiling) and  D2 PNF pattern for shoulder bil.    General Comments        Pertinent Vitals/Pain Pain Assessment: Faces Faces Pain Scale: No hurt Pain Intervention(s): Monitored during session    Home Living                      Prior Function            PT Goals (current goals can now be found in the care plan section) Acute Rehab PT Goals PT Goal Formulation: Patient unable to participate in goal setting Time For Goal Achievement: 02/16/20 Potential to Achieve Goals: Fair Progress towards PT goals: Progressing toward goals    Frequency    Min 3X/week      PT Plan Current plan remains appropriate    Co-evaluation              AM-PAC PT "6 Clicks" Mobility   Outcome Measure  Help needed turning from your back to your side while in a flat bed without using bedrails?: Total Help needed moving from lying on your back to sitting on the side of a flat bed without using bedrails?: Total Help needed moving to and from a bed to a chair (including a wheelchair)?: Total Help needed standing up from a chair using your arms (e.g., wheelchair or bedside chair)?: Total Help needed to walk in hospital room?: Total Help needed climbing 3-5 steps with a railing? : Total 6 Click Score: 6    End of Session Equipment Utilized During Treatment: Oxygen Activity Tolerance: Patient tolerated treatment well Patient left: in bed;with bed alarm set;with call bell/phone within reach   PT Visit Diagnosis: Hemiplegia and hemiparesis;Other symptoms and signs involving the nervous system (R29.898);Other abnormalities of gait and mobility (R26.89) Hemiplegia - Right/Left: Left Hemiplegia - dominant/non-dominant: Non-dominant Hemiplegia - caused by: Other Nontraumatic intracranial  hemorrhage     Time: 1743-1800 PT Time Calculation (min) (ACUTE ONLY): 17 min  Charges:  $Therapeutic Exercise: 8-22 mins                     02/07/2020  [name], PT Acute Rehabilitation Services [phone]  (pager) [phone]  (office)   [name] 02/07/2020, 6:29 PM

## 2020-02-07 NOTE — H&P (View-Only) (Signed)
Reason for Consult: Tracheostomy consult Referring Physician: [name], MD  Stacy Moran is an 64 y.o. female.  HPI: Respiratory failure, on ventilator, unable to be extubated.  Found to have a large goiter.  Request made for tracheostomy.  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  History reviewed. No pertinent past medical history.  Past Surgical History:  Procedure Laterality Date  . CRANIOTOMY Left 01/30/2020   Procedure: LEFT FAR LATERAL CRANIOTOMY FOR ANEURYSM CLIPPING;  Surgeon: [name], MD;  Location: [facility];  Service: Neurosurgery;  Laterality: Left;  . IR ANGIO INTRA EXTRACRAN SEL INTERNAL CAROTID BILAT MOD SED  01/30/2020  . RADIOLOGY WITH ANESTHESIA N/A 01/30/2020   Procedure: IR WITH ANESTHESIA;  Surgeon: [name], MD;  Location: [facility];  Service: Radiology;  Laterality: N/A;    No family history on file.  Social History:  has no history on file for tobacco use, alcohol use, and drug use.  Allergies: No Known Allergies  Medications: Reviewed  Results for orders placed or performed during the hospital encounter of 01/30/20 (from the past 48 hour(s))  Glucose, capillary     Status: Abnormal   Collection Time: 02/05/20  4:05 PM  Result Value Ref Range   Glucose-Capillary 131 (H) 70 - 99 mg/dL    Comment: Glucose reference range applies only to samples taken after fasting for at least 8 hours.  Glucose, capillary     Status: Abnormal   Collection Time: 02/05/20  8:41 PM  Result Value Ref Range   Glucose-Capillary 149 (H) 70 - 99 mg/dL    Comment: Glucose reference range applies only to samples taken after fasting for at least 8 hours.  CBC with Differential/Platelet     Status: Abnormal   Collection Time: 02/06/20 12:56 AM  Result Value Ref Range   WBC 13.0 (H) 4.0 - 10.5 K/uL   RBC 3.64 (L) 3.87 - 5.11 MIL/uL   Hemoglobin 9.3 (L) 12.0 - 15.0 g/dL   HCT 30.4 (L) 36 - 46 %   MCV 83.5 80.0 - 100.0 fL   MCH 25.5 (L) 26.0 - 34.0  pg   MCHC 30.6 30.0 - 36.0 g/dL   RDW 15.5 11.5 - 15.5 %   Platelets 163 150 - 400 K/uL   nRBC 0.2 0.0 - 0.2 %   Neutrophils Relative % 83 %   Neutro Abs 10.8 (H) 1.7 - 7.7 K/uL   Lymphocytes Relative 8 %   Lymphs Abs 1.0 0.7 - 4.0 K/uL   Monocytes Relative 8 %   Monocytes Absolute 1.0 0.1 - 1.0 K/uL   Eosinophils Relative 0 %   Eosinophils Absolute 0.0 0.0 - 0.5 K/uL   Basophils Relative 0 %   Basophils Absolute 0.0 0.0 - 0.1 K/uL   Immature Granulocytes 1 %   Abs Immature Granulocytes 0.17 (H) 0.00 - 0.07 K/uL    Comment: Performed at [facility], [address], [place], [place] [address]  Basic metabolic panel     Status: Abnormal   Collection Time: 02/06/20 12:56 AM  Result Value Ref Range   Sodium 154 (H) 135 - 145 mmol/L   Potassium 3.4 (L) 3.5 - 5.1 mmol/L   Chloride 118 (H) 98 - 111 mmol/L   CO2 25 22 - 32 mmol/L   Glucose, Bld 182 (H) 70 - 99 mg/dL    Comment: Glucose reference range applies only to samples taken after fasting for at least 8 hours.   BUN 38 (H) 8 - 23  mg/dL   Creatinine, Ser 0.70 0.44 - 1.00 mg/dL   Calcium 10.2 8.9 - 10.3 mg/dL   GFR, Estimated >60 >60 mL/min    Comment: (NOTE) Calculated using the CKD-EPI Creatinine Equation (2021)    Anion gap 11 5 - 15    Comment: Performed at [facility] [address] [place], [place] [address]  Glucose, capillary     Status: Abnormal   Collection Time: 02/06/20  1:02 AM  Result Value Ref Range   Glucose-Capillary 160 (H) 70 - 99 mg/dL    Comment: Glucose reference range applies only to samples taken after fasting for at least 8 hours.  Glucose, capillary     Status: Abnormal   Collection Time: 02/06/20  4:24 AM  Result Value Ref Range   Glucose-Capillary 158 (H) 70 - 99 mg/dL    Comment: Glucose reference range applies only to samples taken after fasting for at least 8 hours.  Glucose, capillary     Status: Abnormal   Collection Time: 02/06/20  7:17 AM  Result Value Ref Range    Glucose-Capillary 170 (H) 70 - 99 mg/dL    Comment: Glucose reference range applies only to samples taken after fasting for at least 8 hours.  I-STAT 7, (LYTES, BLD GAS, ICA, H+H)     Status: Abnormal   Collection Time: 02/06/20  8:43 AM  Result Value Ref Range   pH, Arterial 7.444 7.35 - 7.45   pCO2 arterial 42.8 32 - 48 mmHg   pO2, Arterial 157 (H) 83 - 108 mmHg   Bicarbonate 29.2 (H) 20.0 - 28.0 mmol/L   TCO2 30 22 - 32 mmol/L   O2 Saturation 99.0 %   Acid-Base Excess 5.0 (H) 0.0 - 2.0 mmol/L   Sodium 157 (H) 135 - 145 mmol/L   Potassium 3.3 (L) 3.5 - 5.1 mmol/L   Calcium, Ion 1.38 1.15 - 1.40 mmol/L   HCT 29.0 (L) 36 - 46 %   Hemoglobin 9.9 (L) 12.0 - 15.0 g/dL   Patient temperature 99.6 F    Collection site Radial    Drawn by RT    Sample type ARTERIAL   Glucose, capillary     Status: None   Collection Time: 02/06/20 11:46 AM  Result Value Ref Range   Glucose-Capillary 87 70 - 99 mg/dL    Comment: Glucose reference range applies only to samples taken after fasting for at least 8 hours.  Glucose, capillary     Status: Abnormal   Collection Time: 02/06/20  3:28 PM  Result Value Ref Range   Glucose-Capillary 143 (H) 70 - 99 mg/dL    Comment: Glucose reference range applies only to samples taken after fasting for at least 8 hours.  Glucose, capillary     Status: Abnormal   Collection Time: 02/06/20  7:42 PM  Result Value Ref Range   Glucose-Capillary 169 (H) 70 - 99 mg/dL    Comment: Glucose reference range applies only to samples taken after fasting for at least 8 hours.  Glucose, capillary     Status: Abnormal   Collection Time: 02/06/20 11:24 PM  Result Value Ref Range   Glucose-Capillary 208 (H) 70 - 99 mg/dL    Comment: Glucose reference range applies only to samples taken after fasting for at least 8 hours.  Glucose, capillary     Status: Abnormal   Collection Time: 02/07/20  3:16 AM  Result Value Ref Range   Glucose-Capillary 135 (H) 70 - 99 mg/dL  Comment:  Glucose reference range applies only to samples taken after fasting for at least 8 hours.  Magnesium     Status: Abnormal   Collection Time: 02/07/20  3:34 AM  Result Value Ref Range   Magnesium 2.6 (H) 1.7 - 2.4 mg/dL    Comment: Performed at [facility] [address], [place], [place] [address]  CBC     Status: Abnormal   Collection Time: 02/07/20  3:34 AM  Result Value Ref Range   WBC 15.6 (H) 4.0 - 10.5 K/uL   RBC 3.82 (L) 3.87 - 5.11 MIL/uL   Hemoglobin 9.9 (L) 12.0 - 15.0 g/dL   HCT 32.9 (L) 36 - 46 %   MCV 86.1 80.0 - 100.0 fL   MCH 25.9 (L) 26.0 - 34.0 pg   MCHC 30.1 30.0 - 36.0 g/dL   RDW 15.9 (H) 11.5 - 15.5 %   Platelets 188 150 - 400 K/uL   nRBC 0.2 0.0 - 0.2 %    Comment: Performed at [facility], [place] [address] [place], [place] [address]  Basic metabolic panel     Status: Abnormal   Collection Time: 02/07/20  3:34 AM  Result Value Ref Range   Sodium 155 (H) 135 - 145 mmol/L   Potassium 3.6 3.5 - 5.1 mmol/L   Chloride 121 (H) 98 - 111 mmol/L   CO2 22 22 - 32 mmol/L   Glucose, Bld 166 (H) 70 - 99 mg/dL    Comment: Glucose reference range applies only to samples taken after fasting for at least 8 hours.   BUN 44 (H) 8 - 23 mg/dL   Creatinine, Ser 0.70 0.44 - 1.00 mg/dL   Calcium 9.9 8.9 - 10.3 mg/dL   GFR, Estimated >60 >60 mL/min    Comment: (NOTE) Calculated using the CKD-EPI Creatinine Equation (2021)    Anion gap 12 5 - 15    Comment: Performed at [facility] [address], [place], [place] [address]  Glucose, capillary     Status: Abnormal   Collection Time: 02/07/20  8:28 AM  Result Value Ref Range   Glucose-Capillary 159 (H) 70 - 99 mg/dL    Comment: Glucose reference range applies only to samples taken after fasting for at least 8 hours.  TSH     Status: Abnormal   Collection Time: 02/07/20  9:39 AM  Result Value Ref Range   TSH 0.118 (L) 0.350 - 4.500 uIU/mL    Comment: Performed by a 3rd Generation assay with a functional  sensitivity of <=0.01 uIU/mL. Performed at [facility], [place] [address] [place], [place] [address]   Glucose, capillary     Status: Abnormal   Collection Time: 02/07/20 11:40 AM  Result Value Ref Range   Glucose-Capillary 147 (H) 70 - 99 mg/dL    Comment: Glucose reference range applies only to samples taken after fasting for at least 8 hours.  Osmolality, urine     Status: None   Collection Time: 02/07/20  1:35 PM  Result Value Ref Range   Osmolality, Ur 748 300 - 900 mOsm/kg    Comment: Performed at [facility] [address], [place], [place] [address]  Urinalysis, Routine w reflex microscopic Urine, Catheterized     Status: None   Collection Time: 02/07/20  1:35 PM  Result Value Ref Range   Color, Urine YELLOW YELLOW   APPearance CLEAR CLEAR   Specific Gravity, Urine 1.024 1.005 -  1.030   pH 5.0 5.0 - 8.0   Glucose, UA NEGATIVE NEGATIVE mg/dL   Hgb urine dipstick NEGATIVE NEGATIVE   Bilirubin Urine NEGATIVE NEGATIVE   Ketones, ur NEGATIVE NEGATIVE mg/dL   Protein, ur NEGATIVE NEGATIVE mg/dL   Nitrite NEGATIVE NEGATIVE   Leukocytes,Ua NEGATIVE NEGATIVE    Comment: Performed at [facility] [address], [place], [place] [address]    US SOFT TISSUE HEAD & NECK (NON-THYROID)  Result Date: 02/05/2020 CLINICAL DATA:  64 year old female with left neck mass. EXAM: ULTRASOUND OF HEAD/NECK SOFT TISSUES TECHNIQUE: Ultrasound examination of the head and neck soft tissues was performed in the area of clinical concern. COMPARISON:  None. FINDINGS: Targeted sonographic images of the left neck in the region of the clinical concern was performed using grayscale and color Doppler. There is a 2.3 x 2.3 x 2.4 cm rounded mass in the left neck along the inferior aspect of the left thyroid lobe which appears to be arising from the thyroid gland. This mass demonstrates similar echogenicity as the background of thyroid gland. Color images demonstrate some flow within this mass. No  drainable fluid collection or abscess identified. This likely represents a thyroid nodule. Dedicated thyroid ultrasound is recommended for better evaluation on a nonemergent/outpatient basis. IMPRESSION: Probable right thyroid nodule. Further evaluation with dedicated thyroid ultrasound on a nonemergent/outpatient basis recommended. Electronically Signed   By: Anner Crete M.D.   On: 02/05/2020 18:12   US THYROID  Result Date: 02/06/2020 CLINICAL DATA:  Left thyroid mass EXAM: THYROID ULTRASOUND TECHNIQUE: Ultrasound examination of the thyroid gland and adjacent soft tissues was performed. COMPARISON:  None. FINDINGS: Parenchymal Echotexture: Mildly heterogenous Isthmus: 1.3 cm Right lobe: 6.9 x 3.3 x 3 cm Left lobe: 7.6 x 3.2 x 4.2 cm _________________________________________________________ Estimated total number of nodules >/= 1 cm: 2 Number of spongiform nodules >/=  2 cm not described below (TR1): 0 Number of mixed cystic and solid nodules >/= 1.5 cm not described below (TR2): 0 _________________________________________________________ Small cystic nodules are noted in the right thyroid gland measuring up to approximately 4 mm. There is an isoechoic ill-defined 1 cm thyroid nodule in the isthmus that does not meet criteria for follow-up or fine-needle aspiration. Nodule # 1: Location: Left; Inferior Maximum size: 3.4 cm; Other 2 dimensions: 2.5 x 2.4 cm Composition: solid/almost completely solid (2) Echogenicity: isoechoic (1) Shape: not taller-than-wide (0) Margins: ill-defined (0) Echogenic foci: none (0) ACR TI-RADS total points: 3. ACR TI-RADS risk category: TR3 (3 points). ACR TI-RADS recommendations: **Given size (>/= 2.5 cm) and appearance, fine needle aspiration of this mildly suspicious nodule should be considered based on TI-RADS criteria. _________________________________________________________ There is an additional 9 mm cystic nodule in the left inferior thyroid gland that does not meet  criteria for follow-up. IMPRESSION: 1. Multinodular goiter as detailed above. 2. There is a 3.4 cm TR 3 thyroid nodule in the left inferior thyroid gland. Fine-needle aspiration is recommended for this thyroid nodule. The above is in keeping with the ACR TI-RADS recommendations - J Am Coll Radiol 2017;14:587-595. Electronically Signed   By: Constance Holster M.D.   On: 02/06/2020 19:01   VAS [place] TRANSCRANIAL DOPPLER  Result Date: 02/07/2020  Transcranial Doppler Indications: Subarachnoid hemorrhage. History: No known past medical history. Limitations for diagnostic windows: Unable to insonate right transtemporal window. Unable to insonate left transtemporal window. Unable to insonate left orbital window. Unable to insonate occipital window. Comparison Study: 02/04/2020- TCD Performing Technologist: Maudry Mayhew MHA, RDMS, RVT,  RDCS  Examination Guidelines: A complete evaluation includes B-mode imaging, spectral Doppler, color Doppler, and power Doppler as needed of all accessible portions of each vessel. Bilateral testing is considered an integral part of a complete examination. Limited examinations for reoccurring indications may be performed as noted.  +----------+-------------+----------+-----------+------------------+ RIGHT TCD Right VM (cm)Depth (cm)Pulsatility     Comment       +----------+-------------+----------+-----------+------------------+ MCA                                         Unable to insonate +----------+-------------+----------+-----------+------------------+ ACA                                         Unable to insonate +----------+-------------+----------+-----------+------------------+ Term ICA                                    Unable to insonate +----------+-------------+----------+-----------+------------------+ PCA                                         Unable to insonate +----------+-------------+----------+-----------+------------------+  Opthalmic     17.00                 1.40                       +----------+-------------+----------+-----------+------------------+ ICA siphon    20.00                 1.19                       +----------+-------------+----------+-----------+------------------+ Vertebral                                   Unable to insonate +----------+-------------+----------+-----------+------------------+  +----------+------------+----------+-----------+------------------+ LEFT TCD  Left VM (cm)Depth (cm)Pulsatility     Comment       +----------+------------+----------+-----------+------------------+ MCA                                        Unable to insonate +----------+------------+----------+-----------+------------------+ ACA                                        Unable to insonate +----------+------------+----------+-----------+------------------+ Term ICA                                   Unable to insonate +----------+------------+----------+-----------+------------------+ PCA                                        Unable to insonate +----------+------------+----------+-----------+------------------+ Opthalmic  Unable to insonate +----------+------------+----------+-----------+------------------+ ICA siphon                                 Unable to insonate +----------+------------+----------+-----------+------------------+ Vertebral                                  Unable to insonate +----------+------------+----------+-----------+------------------+  +------------+-------+------------------+             VM cm/s     Comment       +------------+-------+------------------+ Prox Basilar       Unable to insonate +------------+-------+------------------+ Summary:  Absent bitemporal and occipital windows greatly limit exam. antegrade flow in right opthalmic and carotid siphon noted. *See table(s) above for TCD  measurements and observations.  Diagnosing physician: Antony Contras MD Electronically signed by Antony Contras MD on 02/07/2020 at 12:58:16 PM.    Final     [id number] except as listed in admit H&P  Blood pressure (!) 170/78, pulse 89, temperature 98.4 F (36.9 C), temperature source Axillary, resp. rate 20, height 5\' 9"  (1.753 m), weight 60.6 kg, SpO2 100 %.  PHYSICAL EXAM: Overall appearance: Lying supine, restrained, on ventilator but able to respond to questions with nodding and hand gestures Head:  Normocephalic, atraumatic. Ears: External ears look healthy. Nose: External nose is healthy in appearance. Oral Cavity/Pharynx:  There are no mucosal lesions or masses identified.  Endotracheal tube in place. Larynx/Hypopharynx: Deferred Neuro:  No identifiable neurologic deficits. Neck: Large bilateral soft goiter, no other masses palpable.  Studies Reviewed: CTA of the neck was reviewed.  Procedures: none   Assessment/Plan: Large goiter.  To perform tracheostomy safely I would need to remove the goiter.  I will discuss this with her daughter.  I will try to schedule this soon as possible, possibly tomorrow but if not early next week.  I think she will have a much easier time coming off the ventilator with tracheostomy in place.  [name] 02/07/2020, 3:59 PM

## 2020-02-07 NOTE — Progress Notes (Signed)
Attending:    Subjective: Subarachnoid hemorrhage Intubated, had stridor, re-intubated Now does not have cuff leak despite steroids Awake on vent    Objective: Vitals:   02/07/20 0600 02/07/20 0700 02/07/20 0752 02/07/20 0800  BP: (!) 145/78 (!) 167/84    Pulse: 66 67    Resp: 16 16    Temp:    (!) 100.4 F (38 C)  TempSrc:    Axillary  SpO2: 100% 100% 100%   Weight:      Height:       Vent Mode: PRVC FiO2 (%):  [30 %-40 %] 30 % Set Rate:  [16 bmp] 16 bmp Vt Set:  [520 mL] 520 mL PEEP:  [5 cmH20-8 cmH20] 5 cmH20 Plateau Pressure:  [18 cmH20-21 cmH20] 18 cmH20  Intake/Output Summary (Last 24 hours) at 02/07/2020 0943 Last data filed at 02/07/2020 0700 Gross per 24 hour  Intake 2456.9 ml  Output 906 ml  Net 1550.9 ml    General:  In bed on vent HENT: Surgical scar well healed, ETT in place PULM: CTA B, vent supported breathing CV: RRR, no mgr GI: BS+, soft, nontender MSK: normal bulk and tone Neuro: awake, nods head appropriately, following commands    CBC    Component Value Date/Time   WBC 15.6 (H) 02/07/2020 0334   RBC 3.82 (L) 02/07/2020 0334   HGB 9.9 (L) 02/07/2020 0334   HCT 32.9 (L) 02/07/2020 0334   PLT 188 02/07/2020 0334   MCV 86.1 02/07/2020 0334   MCH 25.9 (L) 02/07/2020 0334   MCHC 30.1 02/07/2020 0334   RDW 15.9 (H) 02/07/2020 0334   LYMPHSABS 1.0 02/06/2020 0056   MONOABS 1.0 02/06/2020 0056   EOSABS 0.0 02/06/2020 0056   BASOSABS 0.0 02/06/2020 0056    BMET    Component Value Date/Time   NA 155 (H) 02/07/2020 0334   K 3.6 02/07/2020 0334   CL 121 (H) 02/07/2020 0334   CO2 22 02/07/2020 0334   GLUCOSE 166 (H) 02/07/2020 0334   BUN 44 (H) 02/07/2020 0334   CREATININE 0.70 02/07/2020 0334   CALCIUM 9.9 02/07/2020 0334   GFRNONAA >60 02/07/2020 0334    CXR images reviewd, ETT in place, normal lung fields  Impression/Plan: Acute respiratory failure with hypoxemia, no cuff leak on rounds today> needs tracheostomy, consult ENT  given thyroid enlargement; continue full vent support in the meantime 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

## 2020-02-07 NOTE — Progress Notes (Addendum)
NAME:  Stacy Moran, MRN:  [id number], DOB:  [birth year], LOS: 0 ADMISSION DATE:  01/30/2020, CONSULTATION DATE:  01/30/2020 REFERRING MD:  Ferne Reus, PA, CHIEF COMPLAINT:  Headache  Brief History   89 yoF presenting with sudden onset of worst headache of her life.  BP 262/123.  CT/ CTA showed large volume SAH secondary to ruptured PICA with resultant hydrocephalus. Progressive decline in mental status in ER requiring intubation for airway protection.    Past Medical History  No known past medical history   Significant Hospital Events   11/3 Admitted 11/4 craniotomy with clipping of left PICA  Consults:  PCCM  Procedures:  11/3 ETT >>11/9 11/3 EVD >> 11/9 ETT >>  Significant Diagnostic Tests:  11/3 CTH > 1. 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 2. Intraventricular reflux with hydrocephalus. 3. Chronic small vessel disease. 11/3 CTA head/ neck > 1. Ruptured 3 mm left PICA aneurysm. 2. The left vertebral artery is dominant and diffusely patent. Moderate right V4 segment stenosis. 3. 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 After recovery, recommend chest CT with contrast. 4. No stenosis or significant atherosclerosis in the neck. 5. 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  11/9 H&N [place] 2.3 x 2.3 x 2.4 cm mass in the left neck along the inferior aspect of the left thyroid lobe which appears to be arising from the thyroid gland. Color images demonstrate some flow within this mass. No drainable fluid collection or abscess identified. Likely represents a thyroid nodule   Micro Data:  11/3 SARS2/ Flu > neg 11/8 respiratory culture >> 11/8 blood culture >> 11/11 urine culture >>  Antimicrobials:    Interim history/subjective:  Following all commands and moving extremities. Tolerating  pressure support.  Objective   Blood pressure (!) 167/84, pulse 67, temperature 99.8 F (37.7 C), temperature source Oral, resp. rate 16, height 5\' 9"  (1.753 m), weight 60.6 kg, SpO2 100 %.    Vent Mode: PRVC FiO2 (%):  [40 %-50 %] 40 % Set Rate:  [16 bmp] 16 bmp Vt Set:  [520 mL] 520 mL PEEP:  [8 cmH20] 8 cmH20 Plateau Pressure:  [18 [id number] cmH20] 20 cmH20   Intake/Output Summary (Last 24 hours) at 02/07/2020 0710 Last data filed at 02/07/2020 0600 Gross per 24 hour  Intake 2781.33 ml  Output 1456 ml  Net 1325.33 ml   Filed Weights   02/05/20 0500 02/06/20 0500 02/07/20 0326  Weight: 62.3 kg 59.8 kg 60.6 kg    Examination:  General: Middle-aged African-American female, lying in the bed, orally intubated HEENT: EVD in place, moist mucous membranes ETT and OGT in place; diffuse, soft anterior thyroid mass with more swelling on the left  Lungs: CTAB, no wheezing or rhonchi  Cardiovascular: RRR, no MRG  Abdomen: soft, non-distended, active bowel sounds  Skin: intact, no rash Neuro: prosthetic left eye, right eye with pupil briskly reactive to light.  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  Complicated with hydrocephalus status post EVD Acute encephalopathy due to subarachnoid hemorrhage Off sedation, following commands and moving all extremities.  - continue neuro checks - Appreciate neurosurgery input, recommend liberalizing blood pressure to systolic <562 - Cont. losartan -  EVD in place at level 10 H20, drainage decreasing - Continue nimodipine - Maintain euvolemia and normal natremia - start free water flushes with elevated Na despite IVF.  - completed Keppra 7 days - Daily TCD's - Avoid sedation, has precedex prn  Leukocytosis Urinary Retention Fever and  leukocytosis possibly secondary to inflammation from [facility]. Fever resolved. Respiratory cultures 11/8 with rare g+ cocci, likely normal flora.  Urinary retention requiring I&Ox4.  - foley placed - urine culture, UA   Acute respiratory insufficiency due to subarachnoid hemorrhage Thyroid Mass  Required reintubation 11/9 due to stridor with diffuse thyroid mass noted on exam, [place] and CT. Continues to have no cuff leak today. Tolerating SBT 10/5 well.   -ENT consulted for trach -stop steroids   -cont MV with 4-8 cc/kg IBW with goal Pplat <30 and DP <15 -daily SBT -Avoid sedation with RASS goal 0/-1.   Hypertensive emergency:  - Patient is on losartan 100, labetalol prn - Cont permissive hypertension with systolic goal less than 324 with improved mentation with higher bp goal - stopped clonidine and cleviprex  Hypernatremia Hypokalemia Na increased s/p 1L LRx2 the last two days. Net+ 1L but likely still dehydrated secondary to previous lactulose and decreased fluid intake.   - lactulose stopped - urine osm - start free water flushes  - 1L LR bolus   Prediabetic with hyperglycemia:  a1c 6.2 Continue sliding scale insulin with goal fingerstick glucose 140-180  Thyroid Mass Thyroid US with recommendation for FNA. Will need outpatient follow-up or here if prolonged hospital stay. TSH pending.    Best practice:  Diet: TF  Pain/Anxiety/Delirium protocol (if indicated): As needed propofol VAP protocol (if indicated): Yes DVT prophylaxis: subq heparin GI prophylaxis: ppi Glucose control: monitoring Mobility: mobilize  Code Status: FULL Family Communication: Per primary team, updated & discussed trach with daughter [name] 11/11 Disposition: ICU  Labs   CBC: Recent Labs  Lab 02/02/20 0421 02/02/20 0421 02/04/20 0732 02/04/20 0732 02/05/20 0244 02/05/20 1152 02/06/20 0056 02/06/20 0843 02/07/20 0334  WBC 19.7*  --  15.8*  --  15.4*  --  13.0*  --  15.6*  NEUTROABS  --   --    --   --   --   --  10.8*  --   --   HGB 9.9*   < > 10.1*   < > 10.1* 9.5* 9.3* 9.9* 9.9*  HCT 28.2*   < > 32.6*   < > 33.0* 28.0* 30.4* 29.0* 32.9*  MCV 82.5  --  81.5  --  84.4  --  83.5  --  86.1  PLT 113*  --  165  --  149*  --  163  --  188   < > = values in this interval not displayed.    Basic Metabolic Panel: Recent Labs  Lab 01/31/20 1710 01/31/20 1710 02/01/20 0554 02/01/20 0554 02/01/20 1701 02/02/20 0424 02/02/20 0424 02/02/20 2201 02/04/20 0732 02/04/20 0732 02/05/20 0244 02/05/20 1152 02/06/20 0056 02/06/20 0843 02/07/20 0334  NA  --   --  140   < >  --  139   < >  --  149*   < > 152* 155* 154* 157* 155*  K  --   --  3.1*   < >  --  4.3   < >  --  3.9   < > 4.0 3.6 3.4* 3.3* 3.6  CL  --   --  112*   < >  --  111  --   --  112*  --  115*  --  118*  --  121*  CO2  --   --  17*   < >  --  21*  --   --  26  --  24  --  25  --  22  GLUCOSE  --   --  215*   < >  --  193*  --   --  133*  --  130*  --  182*  --  166*  BUN  --   --  25*   < >  --  23  --   --  19  --  28*  --  38*  --  44*  CREATININE  --   --  0.74   < >  --  0.61  --   --  0.53  --  0.62  --  0.70  --  0.70  CALCIUM  --   --  8.4*   < >  --  8.8*  --   --  10.0  --  10.2  --  10.2  --  9.9  MG 2.1   < > 2.3   < > 3.0* 2.7*  --  2.9* 2.9*  --   --   --   --   --  2.6*  PHOS 3.7  --  4.2  4.3  --  2.8 2.3*  --   --  2.8  --   --   --   --   --   --    < > = values in this interval not displayed.   GFR: Estimated Creatinine Clearance: 69.8 mL/min (by C-G formula based on SCr of 0.7 mg/dL). Recent Labs  Lab 02/04/20 0732 02/05/20 0244 02/06/20 0056 02/07/20 0334  WBC 15.8* 15.4* 13.0* 15.6*    Liver Function Tests: Recent Labs  Lab 02/01/20 0554 02/02/20 0424  ALBUMIN 2.9* 2.7*   No results for input(s): LIPASE, AMYLASE in the last 168 hours. No results for input(s): AMMONIA in the last 168 hours.  ABG    Component Value Date/Time   PHART 7.444 02/06/2020 0843   PCO2ART 42.8  02/06/2020 0843   PO2ART 157 (H) 02/06/2020 0843   HCO3 29.2 (H) 02/06/2020 0843   TCO2 30 02/06/2020 0843   ACIDBASEDEF 4.0 (H) 01/31/2020 0113   O2SAT 99.0 02/06/2020 0843     Coagulation Profile: No results for input(s): INR, PROTIME in the last 168 hours.  Cardiac Enzymes: No results for input(s): CKTOTAL, CKMB, CKMBINDEX, TROPONINI in the last 168 hours.  HbA1C: Hgb A1c MFr Bld  Date/Time Value Ref Range Status  01/30/2020 08:59 AM 6.2 (H) 4.8 - 5.6 % Final    Comment:    (NOTE) Pre diabetes:          5.7%-6.4%  Diabetes:              >6.4%  Glycemic control for   <7.0% adults with diabetes     CBG: Recent Labs  Lab 02/06/20 1146 02/06/20 1528 02/06/20 1942 02/06/20 2324 02/07/20 0316  GLUCAP 87 143* 169* 208* 135*    Bryant Lipps A, DO 02/07/2020, 7:10 AM Pager: [phone]

## 2020-02-07 NOTE — Consult Note (Signed)
Reason for Consult: Tracheostomy consult Referring Physician: [name], MD  Stacy Moran is an 64 y.o. female.  HPI: Respiratory failure, on ventilator, unable to be extubated.  Found to have a large goiter.  Request made for tracheostomy.  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  History reviewed. No pertinent past medical history.  Past Surgical History:  Procedure Laterality Date  . CRANIOTOMY Left 01/30/2020   Procedure: LEFT FAR LATERAL CRANIOTOMY FOR ANEURYSM CLIPPING;  Surgeon: [name], MD;  Location: [facility];  Service: Neurosurgery;  Laterality: Left;  . IR ANGIO INTRA EXTRACRAN SEL INTERNAL CAROTID BILAT MOD SED  01/30/2020  . RADIOLOGY WITH ANESTHESIA N/A 01/30/2020   Procedure: IR WITH ANESTHESIA;  Surgeon: [name], MD;  Location: [facility];  Service: Radiology;  Laterality: N/A;    No family history on file.  Social History:  has no history on file for tobacco use, alcohol use, and drug use.  Allergies: No Known Allergies  Medications: Reviewed  Results for orders placed or performed during the hospital encounter of 01/30/20 (from the past 48 hour(s))  Glucose, capillary     Status: Abnormal   Collection Time: 02/05/20  4:05 PM  Result Value Ref Range   Glucose-Capillary 131 (H) 70 - 99 mg/dL    Comment: Glucose reference range applies only to samples taken after fasting for at least 8 hours.  Glucose, capillary     Status: Abnormal   Collection Time: 02/05/20  8:41 PM  Result Value Ref Range   Glucose-Capillary 149 (H) 70 - 99 mg/dL    Comment: Glucose reference range applies only to samples taken after fasting for at least 8 hours.  CBC with Differential/Platelet     Status: Abnormal   Collection Time: 02/06/20 12:56 AM  Result Value Ref Range   WBC 13.0 (H) 4.0 - 10.5 K/uL   RBC 3.64 (L) 3.87 - 5.11 MIL/uL   Hemoglobin 9.3 (L) 12.0 - 15.0 g/dL   HCT 30.4 (L) 36 - 46 %   MCV 83.5 80.0 - 100.0 fL   MCH 25.5 (L) 26.0 - 34.0  pg   MCHC 30.6 30.0 - 36.0 g/dL   RDW 15.5 11.5 - 15.5 %   Platelets 163 150 - 400 K/uL   nRBC 0.2 0.0 - 0.2 %   Neutrophils Relative % 83 %   Neutro Abs 10.8 (H) 1.7 - 7.7 K/uL   Lymphocytes Relative 8 %   Lymphs Abs 1.0 0.7 - 4.0 K/uL   Monocytes Relative 8 %   Monocytes Absolute 1.0 0.1 - 1.0 K/uL   Eosinophils Relative 0 %   Eosinophils Absolute 0.0 0.0 - 0.5 K/uL   Basophils Relative 0 %   Basophils Absolute 0.0 0.0 - 0.1 K/uL   Immature Granulocytes 1 %   Abs Immature Granulocytes 0.17 (H) 0.00 - 0.07 K/uL    Comment: Performed at [facility], [address] [place], [place] [address]  Basic metabolic panel     Status: Abnormal   Collection Time: 02/06/20 12:56 AM  Result Value Ref Range   Sodium 154 (H) 135 - 145 mmol/L   Potassium 3.4 (L) 3.5 - 5.1 mmol/L   Chloride 118 (H) 98 - 111 mmol/L   CO2 25 22 - 32 mmol/L   Glucose, Bld 182 (H) 70 - 99 mg/dL    Comment: Glucose reference range applies only to samples taken after fasting for at least 8 hours.   BUN 38 (H) 8 - 23  mg/dL   Creatinine, Ser 0.70 0.44 - 1.00 mg/dL   Calcium 10.2 8.9 - 10.3 mg/dL   GFR, Estimated >60 >60 mL/min    Comment: (NOTE) Calculated using the CKD-EPI Creatinine Equation (2021)    Anion gap 11 5 - 15    Comment: Performed at [facility] [address] [place], [place] [address]  Glucose, capillary     Status: Abnormal   Collection Time: 02/06/20  1:02 AM  Result Value Ref Range   Glucose-Capillary 160 (H) 70 - 99 mg/dL    Comment: Glucose reference range applies only to samples taken after fasting for at least 8 hours.  Glucose, capillary     Status: Abnormal   Collection Time: 02/06/20  4:24 AM  Result Value Ref Range   Glucose-Capillary 158 (H) 70 - 99 mg/dL    Comment: Glucose reference range applies only to samples taken after fasting for at least 8 hours.  Glucose, capillary     Status: Abnormal   Collection Time: 02/06/20  7:17 AM  Result Value Ref Range    Glucose-Capillary 170 (H) 70 - 99 mg/dL    Comment: Glucose reference range applies only to samples taken after fasting for at least 8 hours.  I-STAT 7, (LYTES, BLD GAS, ICA, H+H)     Status: Abnormal   Collection Time: 02/06/20  8:43 AM  Result Value Ref Range   pH, Arterial 7.444 7.35 - 7.45   pCO2 arterial 42.8 32 - 48 mmHg   pO2, Arterial 157 (H) 83 - 108 mmHg   Bicarbonate 29.2 (H) 20.0 - 28.0 mmol/L   TCO2 30 22 - 32 mmol/L   O2 Saturation 99.0 %   Acid-Base Excess 5.0 (H) 0.0 - 2.0 mmol/L   Sodium 157 (H) 135 - 145 mmol/L   Potassium 3.3 (L) 3.5 - 5.1 mmol/L   Calcium, Ion 1.38 1.15 - 1.40 mmol/L   HCT 29.0 (L) 36 - 46 %   Hemoglobin 9.9 (L) 12.0 - 15.0 g/dL   Patient temperature 99.6 F    Collection site Radial    Drawn by RT    Sample type ARTERIAL   Glucose, capillary     Status: None   Collection Time: 02/06/20 11:46 AM  Result Value Ref Range   Glucose-Capillary 87 70 - 99 mg/dL    Comment: Glucose reference range applies only to samples taken after fasting for at least 8 hours.  Glucose, capillary     Status: Abnormal   Collection Time: 02/06/20  3:28 PM  Result Value Ref Range   Glucose-Capillary 143 (H) 70 - 99 mg/dL    Comment: Glucose reference range applies only to samples taken after fasting for at least 8 hours.  Glucose, capillary     Status: Abnormal   Collection Time: 02/06/20  7:42 PM  Result Value Ref Range   Glucose-Capillary 169 (H) 70 - 99 mg/dL    Comment: Glucose reference range applies only to samples taken after fasting for at least 8 hours.  Glucose, capillary     Status: Abnormal   Collection Time: 02/06/20 11:24 PM  Result Value Ref Range   Glucose-Capillary 208 (H) 70 - 99 mg/dL    Comment: Glucose reference range applies only to samples taken after fasting for at least 8 hours.  Glucose, capillary     Status: Abnormal   Collection Time: 02/07/20  3:16 AM  Result Value Ref Range   Glucose-Capillary 135 (H) 70 - 99 mg/dL  Comment:  Glucose reference range applies only to samples taken after fasting for at least 8 hours.  Magnesium     Status: Abnormal   Collection Time: 02/07/20  3:34 AM  Result Value Ref Range   Magnesium 2.6 (H) 1.7 - 2.4 mg/dL    Comment: Performed at [facility] [address] [place], [place] [address]  CBC     Status: Abnormal   Collection Time: 02/07/20  3:34 AM  Result Value Ref Range   WBC 15.6 (H) 4.0 - 10.5 K/uL   RBC 3.82 (L) 3.87 - 5.11 MIL/uL   Hemoglobin 9.9 (L) 12.0 - 15.0 g/dL   HCT 32.9 (L) 36 - 46 %   MCV 86.1 80.0 - 100.0 fL   MCH 25.9 (L) 26.0 - 34.0 pg   MCHC 30.1 30.0 - 36.0 g/dL   RDW 15.9 (H) 11.5 - 15.5 %   Platelets 188 150 - 400 K/uL   nRBC 0.2 0.0 - 0.2 %    Comment: Performed at [facility], [place] [address], [place], [place] [address]  Basic metabolic panel     Status: Abnormal   Collection Time: 02/07/20  3:34 AM  Result Value Ref Range   Sodium 155 (H) 135 - 145 mmol/L   Potassium 3.6 3.5 - 5.1 mmol/L   Chloride 121 (H) 98 - 111 mmol/L   CO2 22 22 - 32 mmol/L   Glucose, Bld 166 (H) 70 - 99 mg/dL    Comment: Glucose reference range applies only to samples taken after fasting for at least 8 hours.   BUN 44 (H) 8 - 23 mg/dL   Creatinine, Ser 0.70 0.44 - 1.00 mg/dL   Calcium 9.9 8.9 - 10.3 mg/dL   GFR, Estimated >60 >60 mL/min    Comment: (NOTE) Calculated using the CKD-EPI Creatinine Equation (2021)    Anion gap 12 5 - 15    Comment: Performed at [facility] [address], [place], [place] [address]  Glucose, capillary     Status: Abnormal   Collection Time: 02/07/20  8:28 AM  Result Value Ref Range   Glucose-Capillary 159 (H) 70 - 99 mg/dL    Comment: Glucose reference range applies only to samples taken after fasting for at least 8 hours.  TSH     Status: Abnormal   Collection Time: 02/07/20  9:39 AM  Result Value Ref Range   TSH 0.118 (L) 0.350 - 4.500 uIU/mL    Comment: Performed by a 3rd Generation assay with a functional  sensitivity of <=0.01 uIU/mL. Performed at [facility], [place] [address], [place], [place] [address]   Glucose, capillary     Status: Abnormal   Collection Time: 02/07/20 11:40 AM  Result Value Ref Range   Glucose-Capillary 147 (H) 70 - 99 mg/dL    Comment: Glucose reference range applies only to samples taken after fasting for at least 8 hours.  Osmolality, urine     Status: None   Collection Time: 02/07/20  1:35 PM  Result Value Ref Range   Osmolality, Ur 748 300 - 900 mOsm/kg    Comment: Performed at [facility] [address] [place], [place] [address]  Urinalysis, Routine w reflex microscopic Urine, Catheterized     Status: None   Collection Time: 02/07/20  1:35 PM  Result Value Ref Range   Color, Urine YELLOW YELLOW   APPearance CLEAR CLEAR   Specific Gravity, Urine 1.024 1.005 -  1.030   pH 5.0 5.0 - 8.0   Glucose, UA NEGATIVE NEGATIVE mg/dL   Hgb urine dipstick NEGATIVE NEGATIVE   Bilirubin Urine NEGATIVE NEGATIVE   Ketones, ur NEGATIVE NEGATIVE mg/dL   Protein, ur NEGATIVE NEGATIVE mg/dL   Nitrite NEGATIVE NEGATIVE   Leukocytes,Ua NEGATIVE NEGATIVE    Comment: Performed at [facility] [address], [place], [place] [address]    US SOFT TISSUE HEAD & NECK (NON-THYROID)  Result Date: 02/05/2020 CLINICAL DATA:  64 year old female with left neck mass. EXAM: ULTRASOUND OF HEAD/NECK SOFT TISSUES TECHNIQUE: Ultrasound examination of the head and neck soft tissues was performed in the area of clinical concern. COMPARISON:  None. FINDINGS: Targeted sonographic images of the left neck in the region of the clinical concern was performed using grayscale and color Doppler. There is a 2.3 x 2.3 x 2.4 cm rounded mass in the left neck along the inferior aspect of the left thyroid lobe which appears to be arising from the thyroid gland. This mass demonstrates similar echogenicity as the background of thyroid gland. Color images demonstrate some flow within this mass. No  drainable fluid collection or abscess identified. This likely represents a thyroid nodule. Dedicated thyroid ultrasound is recommended for better evaluation on a nonemergent/outpatient basis. IMPRESSION: Probable right thyroid nodule. Further evaluation with dedicated thyroid ultrasound on a nonemergent/outpatient basis recommended. Electronically Signed   By: Anner Crete M.D.   On: 02/05/2020 18:12   US THYROID  Result Date: 02/06/2020 CLINICAL DATA:  Left thyroid mass EXAM: THYROID ULTRASOUND TECHNIQUE: Ultrasound examination of the thyroid gland and adjacent soft tissues was performed. COMPARISON:  None. FINDINGS: Parenchymal Echotexture: Mildly heterogenous Isthmus: 1.3 cm Right lobe: 6.9 x 3.3 x 3 cm Left lobe: 7.6 x 3.2 x 4.2 cm _________________________________________________________ Estimated total number of nodules >/= 1 cm: 2 Number of spongiform nodules >/=  2 cm not described below (TR1): 0 Number of mixed cystic and solid nodules >/= 1.5 cm not described below (TR2): 0 _________________________________________________________ Small cystic nodules are noted in the right thyroid gland measuring up to approximately 4 mm. There is an isoechoic ill-defined 1 cm thyroid nodule in the isthmus that does not meet criteria for follow-up or fine-needle aspiration. Nodule # 1: Location: Left; Inferior Maximum size: 3.4 cm; Other 2 dimensions: 2.5 x 2.4 cm Composition: solid/almost completely solid (2) Echogenicity: isoechoic (1) Shape: not taller-than-wide (0) Margins: ill-defined (0) Echogenic foci: none (0) ACR TI-RADS total points: 3. ACR TI-RADS risk category: TR3 (3 points). ACR TI-RADS recommendations: **Given size (>/= 2.5 cm) and appearance, fine needle aspiration of this mildly suspicious nodule should be considered based on TI-RADS criteria. _________________________________________________________ There is an additional 9 mm cystic nodule in the left inferior thyroid gland that does not meet  criteria for follow-up. IMPRESSION: 1. Multinodular goiter as detailed above. 2. There is a 3.4 cm TR 3 thyroid nodule in the left inferior thyroid gland. Fine-needle aspiration is recommended for this thyroid nodule. The above is in keeping with the ACR TI-RADS recommendations - J Am Coll Radiol 2017;14:587-595. Electronically Signed   By: Constance Holster M.D.   On: 02/06/2020 19:01   VAS [place] TRANSCRANIAL DOPPLER  Result Date: 02/07/2020  Transcranial Doppler Indications: Subarachnoid hemorrhage. History: No known past medical history. Limitations for diagnostic windows: Unable to insonate right transtemporal window. Unable to insonate left transtemporal window. Unable to insonate left orbital window. Unable to insonate occipital window. Comparison Study: 02/04/2020- TCD Performing Technologist: Maudry Mayhew MHA, RDMS, RVT,  RDCS  Examination Guidelines: A complete evaluation includes B-mode imaging, spectral Doppler, color Doppler, and power Doppler as needed of all accessible portions of each vessel. Bilateral testing is considered an integral part of a complete examination. Limited examinations for reoccurring indications may be performed as noted.  +----------+-------------+----------+-----------+------------------+ RIGHT TCD Right VM (cm)Depth (cm)Pulsatility     Comment       +----------+-------------+----------+-----------+------------------+ MCA                                         Unable to insonate +----------+-------------+----------+-----------+------------------+ ACA                                         Unable to insonate +----------+-------------+----------+-----------+------------------+ Term ICA                                    Unable to insonate +----------+-------------+----------+-----------+------------------+ PCA                                         Unable to insonate +----------+-------------+----------+-----------+------------------+  Opthalmic     17.00                 1.40                       +----------+-------------+----------+-----------+------------------+ ICA siphon    20.00                 1.19                       +----------+-------------+----------+-----------+------------------+ Vertebral                                   Unable to insonate +----------+-------------+----------+-----------+------------------+  +----------+------------+----------+-----------+------------------+ LEFT TCD  Left VM (cm)Depth (cm)Pulsatility     Comment       +----------+------------+----------+-----------+------------------+ MCA                                        Unable to insonate +----------+------------+----------+-----------+------------------+ ACA                                        Unable to insonate +----------+------------+----------+-----------+------------------+ Term ICA                                   Unable to insonate +----------+------------+----------+-----------+------------------+ PCA                                        Unable to insonate +----------+------------+----------+-----------+------------------+ Opthalmic  Unable to insonate +----------+------------+----------+-----------+------------------+ ICA siphon                                 Unable to insonate +----------+------------+----------+-----------+------------------+ Vertebral                                  Unable to insonate +----------+------------+----------+-----------+------------------+  +------------+-------+------------------+             VM cm/s     Comment       +------------+-------+------------------+ Prox Basilar       Unable to insonate +------------+-------+------------------+ Summary:  Absent bitemporal and occipital windows greatly limit exam. antegrade flow in right opthalmic and carotid siphon noted. *See table(s) above for TCD  measurements and observations.  Diagnosing physician: Antony Contras MD Electronically signed by Antony Contras MD on 02/07/2020 at 12:58:16 PM.    Final     DUK:[id number] except as listed in admit H&P  Blood pressure (!) 170/78, pulse 89, temperature 98.4 F (36.9 C), temperature source Axillary, resp. rate 20, height 5\' 9"  (1.753 m), weight 60.6 kg, SpO2 100 %.  PHYSICAL EXAM: Overall appearance: Lying supine, restrained, on ventilator but able to respond to questions with nodding and hand gestures Head:  Normocephalic, atraumatic. Ears: External ears look healthy. Nose: External nose is healthy in appearance. Oral Cavity/Pharynx:  There are no mucosal lesions or masses identified.  Endotracheal tube in place. Larynx/Hypopharynx: Deferred Neuro:  No identifiable neurologic deficits. Neck: Large bilateral soft goiter, no other masses palpable.  Studies Reviewed: CTA of the neck was reviewed.  Procedures: none   Assessment/Plan: Large goiter.  To perform tracheostomy safely I would need to remove the goiter.  I will discuss this with her daughter.  I will try to schedule this soon as possible, possibly tomorrow but if not early next week.  I think she will have a much easier time coming off the ventilator with tracheostomy in place.  [name] 02/07/2020, 3:59 PM

## 2020-02-07 NOTE — Progress Notes (Signed)
[facility] Progress Note Patient Name: Stacy Moran DOB: 1955/04/02 MRN: [id number]   Date of Service  02/07/2020  HPI/Events of Note  Patient's ET tube migrated proximally and was re-inserted by RT, CXR needed to verify ET tube position.  eICU Interventions  CXR ordered.        Kerry Kass Abdurahman Rugg 02/07/2020, 7:56 PM

## 2020-02-08 ENCOUNTER — Inpatient Hospital Stay (HOSPITAL_COMMUNITY): Payer: Self-pay | Admitting: Anesthesiology

## 2020-02-08 ENCOUNTER — Encounter (HOSPITAL_COMMUNITY): Payer: Self-pay | Admitting: Neurosurgery

## 2020-02-08 ENCOUNTER — Encounter (HOSPITAL_COMMUNITY)
Admission: EM | Disposition: A | Discharge status: Rehabilitation Facility | Payer: Self-pay | Source: Home / Self Care | Attending: Neurosurgery

## 2020-02-08 ENCOUNTER — Other Ambulatory Visit (HOSPITAL_COMMUNITY): Payer: Self-pay

## 2020-02-08 ENCOUNTER — Inpatient Hospital Stay (HOSPITAL_COMMUNITY): Payer: Self-pay

## 2020-02-08 HISTORY — PX: TRACHEOSTOMY TUBE PLACEMENT: SHX814

## 2020-02-08 HISTORY — PX: THYROIDECTOMY: SHX17

## 2020-02-08 LAB — GLUCOSE, CAPILLARY
Glucose-Capillary: 161 mg/dL — ABNORMAL HIGH (ref 70–99)
Glucose-Capillary: 114 mg/dL — ABNORMAL HIGH (ref 70–99)
Glucose-Capillary: 122 mg/dL — ABNORMAL HIGH (ref 70–99)
Glucose-Capillary: 131 mg/dL — ABNORMAL HIGH (ref 70–99)
Glucose-Capillary: 145 mg/dL — ABNORMAL HIGH (ref 70–99)
Glucose-Capillary: 173 mg/dL — ABNORMAL HIGH (ref 70–99)

## 2020-02-08 LAB — CBC
HCT: 33.1 % — ABNORMAL LOW (ref 36.0–46.0)
Hemoglobin: 9.8 g/dL — ABNORMAL LOW (ref 12.0–15.0)
MCH: 25.8 pg — ABNORMAL LOW (ref 26.0–34.0)
MCHC: 29.6 g/dL — ABNORMAL LOW (ref 30.0–36.0)
MCV: 87.1 fL (ref 80.0–100.0)
Platelets: ADEQUATE 10*3/uL (ref 150–400)
RBC: 3.8 MIL/uL — ABNORMAL LOW (ref 3.87–5.11)
RDW: 15.8 % — ABNORMAL HIGH (ref 11.5–15.5)
WBC: 16.8 10*3/uL — ABNORMAL HIGH (ref 4.0–10.5)
nRBC: 0 % (ref 0.0–0.2)

## 2020-02-08 LAB — CBC WITH DIFFERENTIAL/PLATELET
Abs Immature Granulocytes: 0.1 10*3/uL — ABNORMAL HIGH (ref 0.00–0.07)
Basophils Absolute: 0 10*3/uL (ref 0.0–0.1)
Basophils Relative: 0 %
Eosinophils Absolute: 0.1 10*3/uL (ref 0.0–0.5)
Eosinophils Relative: 1 %
HCT: 31.3 % — ABNORMAL LOW (ref 36.0–46.0)
Hemoglobin: 9.3 g/dL — ABNORMAL LOW (ref 12.0–15.0)
Lymphocytes Relative: 13 %
Lymphs Abs: 1.7 10*3/uL (ref 0.7–4.0)
MCH: 25.4 pg — ABNORMAL LOW (ref 26.0–34.0)
MCHC: 29.7 g/dL — ABNORMAL LOW (ref 30.0–36.0)
MCV: 85.5 fL (ref 80.0–100.0)
Metamyelocytes Relative: 1 %
Monocytes Absolute: 0.7 10*3/uL (ref 0.1–1.0)
Monocytes Relative: 5 %
Neutro Abs: 10.7 10*3/uL — ABNORMAL HIGH (ref 1.7–7.7)
Neutrophils Relative %: 80 %
Platelets: 177 10*3/uL (ref 150–400)
RBC: 3.66 MIL/uL — ABNORMAL LOW (ref 3.87–5.11)
RDW: 15.5 % (ref 11.5–15.5)
WBC: 13.4 10*3/uL — ABNORMAL HIGH (ref 4.0–10.5)
nRBC: 0.2 % (ref 0.0–0.2)
nRBC: 1 /100 WBC — ABNORMAL HIGH

## 2020-02-08 LAB — COMPREHENSIVE METABOLIC PANEL
ALT: 30 U/L (ref 0–44)
AST: 20 U/L (ref 15–41)
Albumin: 2.5 g/dL — ABNORMAL LOW (ref 3.5–5.0)
Alkaline Phosphatase: 78 U/L (ref 38–126)
Anion gap: 10 (ref 5–15)
BUN: 30 mg/dL — ABNORMAL HIGH (ref 8–23)
CO2: 25 mmol/L (ref 22–32)
Calcium: 9.4 mg/dL (ref 8.9–10.3)
Chloride: 121 mmol/L — ABNORMAL HIGH (ref 98–111)
Creatinine, Ser: 0.67 mg/dL (ref 0.44–1.00)
GFR, Estimated: >60 mL/min (ref >60)
Glucose, Bld: 163 mg/dL — ABNORMAL HIGH (ref 70–99)
Potassium: 3.6 mmol/L (ref 3.5–5.1)
Sodium: 156 mmol/L — ABNORMAL HIGH (ref 135–145)
Total Bilirubin: 0.5 mg/dL (ref 0.3–1.2)
Total Protein: 6.5 g/dL (ref 6.5–8.1)

## 2020-02-08 LAB — D-DIMER, QUANTITATIVE: D-Dimer, Quant: 5.13 ug/mL-FEU — ABNORMAL HIGH (ref 0.00–0.50)

## 2020-02-08 LAB — URINE CULTURE

## 2020-02-08 LAB — SURGICAL PCR SCREEN
MRSA, PCR: NEGATIVE
Staphylococcus aureus: POSITIVE — AB

## 2020-02-08 LAB — MAGNESIUM: Magnesium: 2.4 mg/dL (ref 1.7–2.4)

## 2020-02-08 LAB — T4, FREE: Free T4: 0.7 ng/dL (ref 0.61–1.12)

## 2020-02-08 LAB — CALCIUM: Calcium: 8.6 mg/dL — ABNORMAL LOW (ref 8.9–10.3)

## 2020-02-08 IMAGING — DX DG CHEST 1V PORT
1 series · 1 of 1 positions shown · non-contrast
Comparison: Three days ago

CLINICAL DATA: Acute respiratory failure

EXAM:
PORTABLE CHEST 1 VIEW

[chest ap]
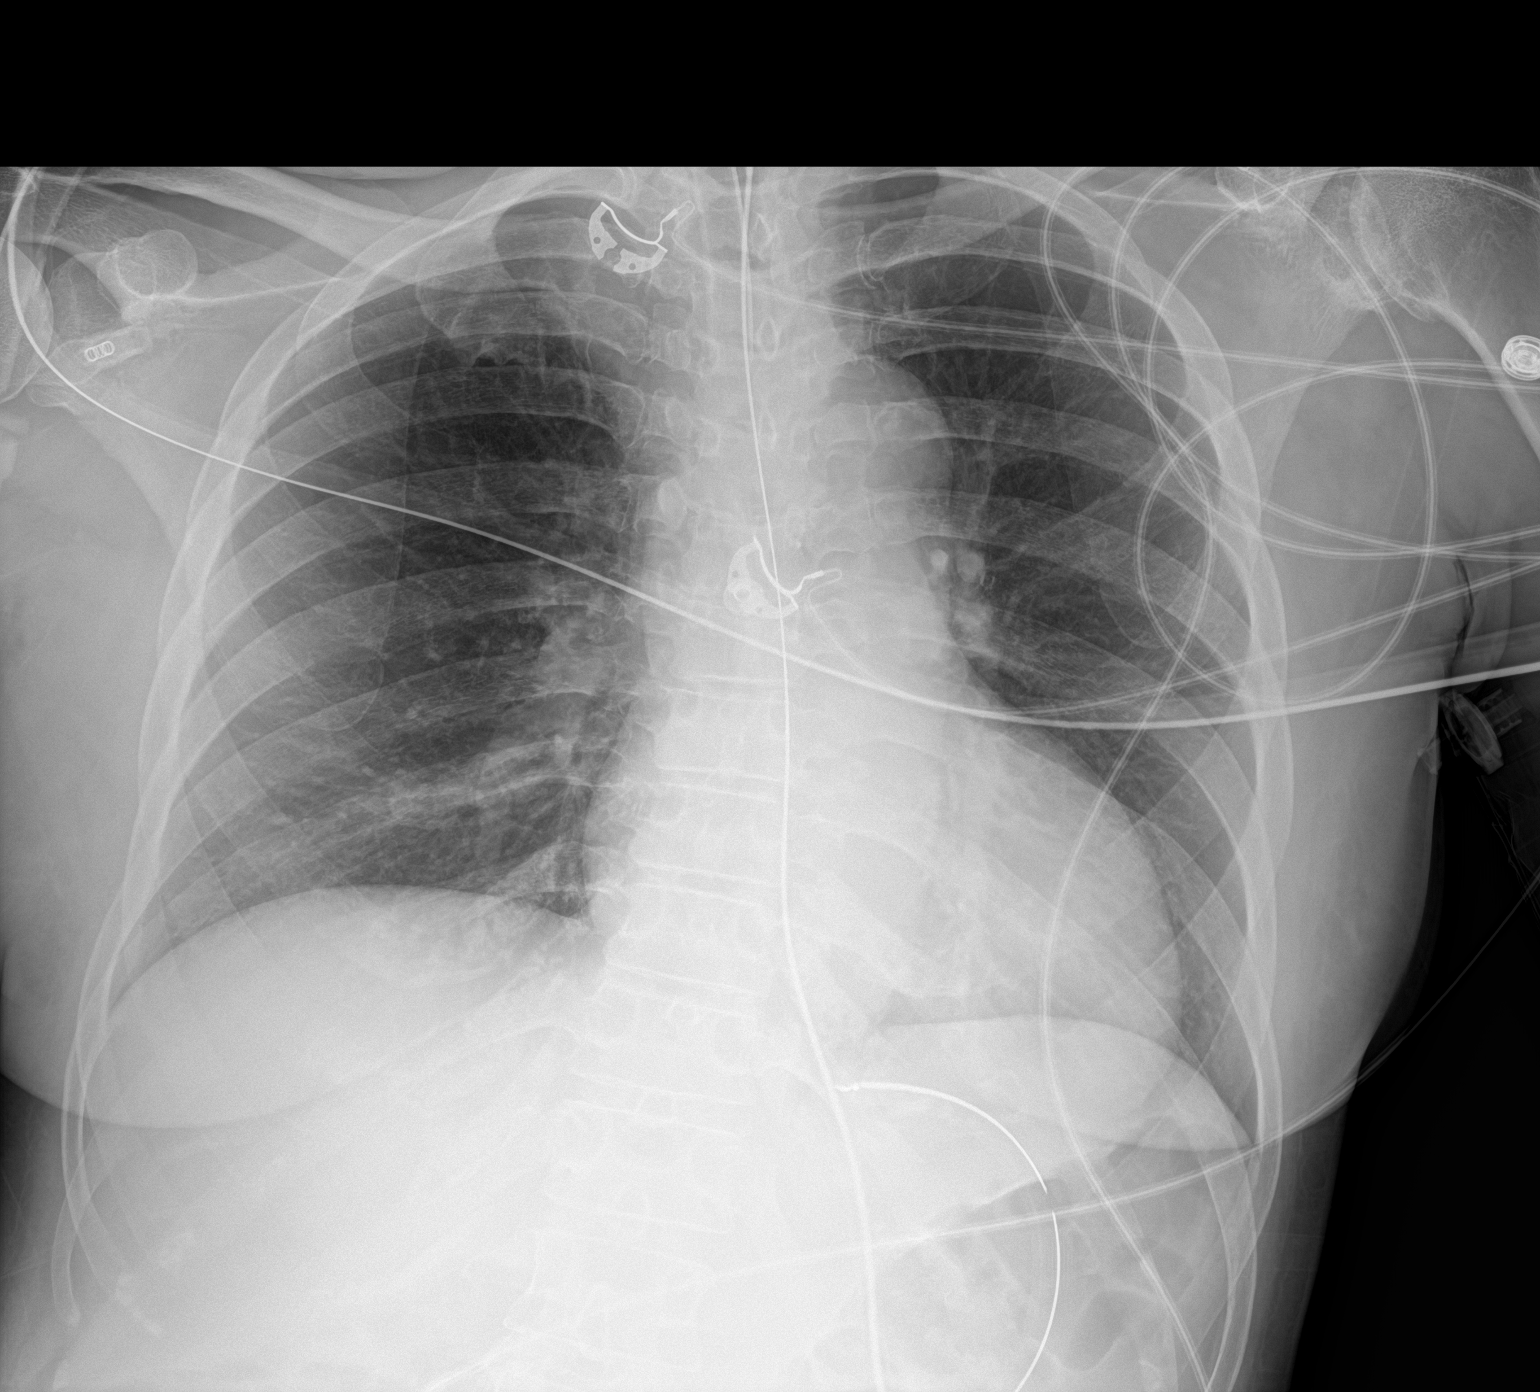

[1 of 1 positions shown; findings below may reference images not displayed]

FINDINGS: Endotracheal tube with tip just below the clavicular heads. The
enteric tube loops at the stomach. Artifact from EKG leads. There is
no edema, consolidation, effusion, or pneumothorax. Prominent heart
size. Stable aortic contours
IMPRESSION: Stable hardware positioning and symmetric, unremarkable aeration.

## 2020-02-08 SURGERY — CREATION, TRACHEOSTOMY
Anesthesia: General

## 2020-02-08 MED ORDER — FENTANYL CITRATE (PF) 100 MCG/2ML IJ SOLN
50.0000 ug | INTRAMUSCULAR | Status: AC | PRN
  Administered 2020-02-08 – 2020-02-09 (×3): 50 ug via INTRAVENOUS
  Filled 2020-02-08 (×3): qty 2

## 2020-02-08 MED ORDER — POLYETHYLENE GLYCOL 3350 17 G PO PACK
17.0000 g | PACK | Freq: Every day | ORAL | Status: DC
  Administered 2020-02-08 – 2020-03-19 (×28): 17 g
  Filled 2020-02-08 (×30): qty 1

## 2020-02-08 MED ORDER — PHENYLEPHRINE 40 MCG/ML (10ML) SYRINGE FOR IV PUSH (FOR BLOOD PRESSURE SUPPORT)
PREFILLED_SYRINGE | INTRAVENOUS | Status: DC | PRN
  Administered 2020-02-08 (×2): 80 ug via INTRAVENOUS

## 2020-02-08 MED ORDER — LABETALOL HCL 100 MG PO TABS
100.0000 mg | ORAL_TABLET | Freq: Two times a day (BID) | ORAL | Status: DC
  Administered 2020-02-08 – 2020-02-10 (×6): 100 mg
  Filled 2020-02-08 (×6): qty 1

## 2020-02-08 MED ORDER — FENTANYL CITRATE (PF) 250 MCG/5ML IJ SOLN
INTRAMUSCULAR | Status: DC | PRN
  Administered 2020-02-08 (×4): 50 ug via INTRAVENOUS
  Administered 2020-02-08: 100 ug via INTRAVENOUS

## 2020-02-08 MED ORDER — 0.9 % SODIUM CHLORIDE (POUR BTL) OPTIME
TOPICAL | Status: DC | PRN
  Administered 2020-02-08: 1000 mL

## 2020-02-08 MED ORDER — LIDOCAINE-EPINEPHRINE 1 %-1:100000 IJ SOLN
INTRAMUSCULAR | Status: AC
  Filled 2020-02-08: qty 1

## 2020-02-08 MED ORDER — MIDAZOLAM HCL 5 MG/5ML IJ SOLN
INTRAMUSCULAR | Status: DC | PRN
  Administered 2020-02-08: 2 mg via INTRAVENOUS

## 2020-02-08 MED ORDER — POLYETHYLENE GLYCOL 3350 17 G PO PACK: 17.0000 g | PACK | Freq: Every day | ORAL | Status: DC

## 2020-02-08 MED ORDER — SODIUM CHLORIDE 0.9% FLUSH
10.0000 mL | INTRAVENOUS | Status: DC | PRN
  Administered 2020-02-16: 10 mL

## 2020-02-08 MED ORDER — FENTANYL CITRATE (PF) 100 MCG/2ML IJ SOLN
50.0000 ug | INTRAMUSCULAR | Status: DC | PRN
  Administered 2020-02-10 – 2020-02-12 (×3): 50 ug via INTRAVENOUS
  Administered 2020-02-13: 100 ug via INTRAVENOUS
  Filled 2020-02-08 (×4): qty 2

## 2020-02-08 MED ORDER — FREE WATER
300.0000 mL | Status: DC
  Administered 2020-02-08 – 2020-02-25 (×60): 300 mL

## 2020-02-08 MED ORDER — DOCUSATE SODIUM 50 MG/5ML PO LIQD: 100.0000 mg | Freq: Two times a day (BID) | ORAL | Status: DC

## 2020-02-08 MED ORDER — LEVOTHYROXINE SODIUM 100 MCG PO TABS: 100.0000 ug | ORAL_TABLET | Freq: Every day | ORAL | Status: DC

## 2020-02-08 MED ORDER — LACTATED RINGERS IV SOLN: INTRAVENOUS | Status: DC | PRN

## 2020-02-08 MED ORDER — LABETALOL HCL 5 MG/ML IV SOLN
INTRAVENOUS | Status: AC
  Filled 2020-02-08: qty 4

## 2020-02-08 MED ORDER — PROPOFOL 1000 MG/100ML IV EMUL
INTRAVENOUS | Status: AC
  Filled 2020-02-08: qty 100

## 2020-02-08 MED ORDER — ROCURONIUM BROMIDE 10 MG/ML (PF) SYRINGE
PREFILLED_SYRINGE | INTRAVENOUS | Status: DC | PRN
  Administered 2020-02-08: 50 mg via INTRAVENOUS
  Administered 2020-02-08: 20 mg via INTRAVENOUS
  Administered 2020-02-08: 30 mg via INTRAVENOUS

## 2020-02-08 MED ORDER — MIDAZOLAM HCL 2 MG/2ML IJ SOLN
INTRAMUSCULAR | Status: AC
  Filled 2020-02-08: qty 2

## 2020-02-08 MED ORDER — LABETALOL HCL 5 MG/ML IV SOLN
INTRAVENOUS | Status: DC | PRN
  Administered 2020-02-08: 5 mg via INTRAVENOUS

## 2020-02-08 MED ORDER — FENTANYL CITRATE (PF) 250 MCG/5ML IJ SOLN
INTRAMUSCULAR | Status: AC
  Filled 2020-02-08: qty 5

## 2020-02-08 MED ORDER — SODIUM CHLORIDE 0.9% FLUSH
10.0000 mL | Freq: Two times a day (BID) | INTRAVENOUS | Status: DC
  Administered 2020-02-08 – 2020-03-14 (×65): 10 mL
  Administered 2020-03-15: 20 mL
  Administered 2020-03-15: 10 mL
  Administered 2020-03-16: 20 mL
  Administered 2020-03-16 – 2020-03-22 (×10): 10 mL
  Administered 2020-03-23: 40 mL
  Administered 2020-03-23 – 2020-03-24 (×2): 10 mL
  Administered 2020-03-24: 40 mL
  Administered 2020-03-25 – 2020-03-27 (×5): 10 mL
  Administered 2020-03-27: 20 mL
  Administered 2020-03-28 – 2020-04-01 (×8): 10 mL
  Administered 2020-04-01: 20 mL
  Administered 2020-04-02 – 2020-04-22 (×38): 10 mL

## 2020-02-08 MED ORDER — MUPIROCIN 2 % EX OINT
1.0000 "application " | TOPICAL_OINTMENT | Freq: Two times a day (BID) | CUTANEOUS | Status: AC
  Administered 2020-02-08 – 2020-02-12 (×10): 1 via NASAL
  Filled 2020-02-08: qty 22

## 2020-02-08 SURGICAL SUPPLY — 55 items
ADH SKN CLS APL DERMABOND .7 (GAUZE/BANDAGES/DRESSINGS)
APL SKNCLS STERI-STRIP NONHPOA (GAUZE/BANDAGES/DRESSINGS)
BENZOIN TINCTURE PRP APPL 2/3 (GAUZE/BANDAGES/DRESSINGS) IMPLANT
BLADE SURG 15 STRL LF DISP TIS (BLADE) IMPLANT
BLADE SURG 15 STRL SS (BLADE) ×4
CLEANER TIP ELECTROSURG 2X2 (MISCELLANEOUS) ×2 IMPLANT
CNTNR URN SCR LID CUP LEK RST (MISCELLANEOUS) ×1 IMPLANT
CONT SPEC 4OZ STRL OR WHT (MISCELLANEOUS) ×2
CORD BIPOLAR FORCEPS 12FT (ELECTRODE) ×2 IMPLANT
COVER SURGICAL LIGHT HANDLE (MISCELLANEOUS) ×2 IMPLANT
DERMABOND ADVANCED (GAUZE/BANDAGES/DRESSINGS)
DERMABOND ADVANCED .7 DNX12 (GAUZE/BANDAGES/DRESSINGS) ×1 IMPLANT
DRAIN HEMOVAC 7FR (DRAIN) IMPLANT
DRAIN PENROSE 1/4X12 LTX STRL (WOUND CARE) ×1 IMPLANT
DRAIN SNY 10 ROU (WOUND CARE) IMPLANT
DRAPE HALF SHEET 40X57 (DRAPES) IMPLANT
ELECT COATED BLADE 2.86 ST (ELECTRODE) ×2 IMPLANT
ELECT REM PT RETURN 9FT ADLT (ELECTROSURGICAL) ×2
ELECTRODE REM PT RTRN 9FT ADLT (ELECTROSURGICAL) ×1 IMPLANT
EVACUATOR SILICONE 100CC (DRAIN) ×2 IMPLANT
FORCEPS BIPOLAR SPETZLER 8 1.0 (NEUROSURGERY SUPPLIES) ×2 IMPLANT
GAUZE 4X4 16PLY RFD (DISPOSABLE) ×2 IMPLANT
GAUZE SPONGE 4X4 12PLY STRL (GAUZE/BANDAGES/DRESSINGS) ×1 IMPLANT
GLOVE BIO SURGEON STRL SZ 6.5 (GLOVE) IMPLANT
GLOVE ECLIPSE 7.5 STRL STRAW (GLOVE) ×2 IMPLANT
GOWN STRL REUS W/ TWL LRG LVL3 (GOWN DISPOSABLE) ×2 IMPLANT
GOWN STRL REUS W/TWL LRG LVL3 (GOWN DISPOSABLE) ×4
HOLDER TRACH TUBE VELCRO 19.5 (MISCELLANEOUS) ×1 IMPLANT
KIT BASIN OR (CUSTOM PROCEDURE TRAY) ×2 IMPLANT
KIT TURNOVER KIT B (KITS) ×2 IMPLANT
MANIFOLD NEPTUNE II (INSTRUMENTS) ×1 IMPLANT
NDL PRECISIONGLIDE 27X1.5 (NEEDLE) ×1 IMPLANT
NEEDLE PRECISIONGLIDE 27X1.5 (NEEDLE) ×2 IMPLANT
NS IRRIG 1000ML POUR BTL (IV SOLUTION) ×2 IMPLANT
PAD ARMBOARD 7.5X6 YLW CONV (MISCELLANEOUS) ×4 IMPLANT
PENCIL FOOT CONTROL (ELECTRODE) ×2 IMPLANT
SHEARS HARMONIC 9CM CVD (BLADE) ×2 IMPLANT
STAPLER VISISTAT 35W (STAPLE) ×2 IMPLANT
SUT CHROMIC 2 0 SH (SUTURE) ×2 IMPLANT
SUT CHROMIC 3 0 PS 2 (SUTURE) ×2 IMPLANT
SUT CHROMIC 4 0 PS 2 18 (SUTURE) IMPLANT
SUT ETHILON 3 0 PS 1 (SUTURE) ×3 IMPLANT
SUT SILK 2 0 SH (SUTURE) ×1 IMPLANT
SUT SILK 3 0 REEL (SUTURE) ×2 IMPLANT
SUT SILK 4 0 (SUTURE) ×2
SUT SILK 4 0 REEL (SUTURE) ×2 IMPLANT
SUT SILK 4 0 TIE 10X30 (SUTURE) ×2 IMPLANT
SUT SILK 4-0 18XBRD TIE 12 (SUTURE) ×1 IMPLANT
SYR 20ML LL LF (SYRINGE) ×2 IMPLANT
SYR CONTROL 10ML LL (SYRINGE) IMPLANT
TOWEL GREEN STERILE FF (TOWEL DISPOSABLE) ×2 IMPLANT
TRAY ENT MC OR (CUSTOM PROCEDURE TRAY) ×2 IMPLANT
TUBE CONNECTING 12X1/4 (SUCTIONS) ×2 IMPLANT
TUBE TRACH SHILEY 8 DIST CUF (TUBING) ×1 IMPLANT
WATER STERILE IRR 1000ML POUR (IV SOLUTION) ×2 IMPLANT

## 2020-02-08 NOTE — Interval H&P Note (Signed)
History and Physical Interval Note:  02/08/2020 11:29 AM  Stacy Moran  has presented today for surgery, with the diagnosis of RESPIRATORY FAILURE.  The various methods of treatment have been discussed with the patient and family. After consideration of risks, benefits and other options for treatment, the patient has consented to  Procedure(s): TRACHEOSTOMY (N/A) THYROIDECTOMY (N/A) as a surgical intervention.  The patient's history has been reviewed, patient examined, no change in status, stable for surgery.  I have reviewed the patient's chart and labs.  Questions were answered to the patient's satisfaction.     [name]

## 2020-02-08 NOTE — Progress Notes (Signed)
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 rate 16, height 5\' 9"  (1.753 m), weight 59.7 kg, SpO2 98 %.    Vent Mode: PRVC FiO2 (%):  [21 %-30 %] 21 % Set Rate:  [16 bmp] 16 bmp Vt Set:  [520 mL] 520 mL PEEP:  [5 cmH20] 5 cmH20 Pressure Support:  [10 cmH20] 10 cmH20 Plateau Pressure:  [18 cmH20-21 cmH20] 21 cmH20   Intake/Output Summary (Last 24 hours) at 02/08/2020 0720 Last data filed at 02/08/2020 0700 Gross per 24 hour  Intake 2209.21 ml  Output 1606 ml  Net 603.21 ml   Filed Weights   02/06/20 0500 02/07/20 0326 02/08/20 0500  Weight: 59.8 kg 60.6 kg 59.7 kg    Examination:  General:  In bed on vent HENT: EVD in place, ETT in place, neck mass PULM: CTA B, vent supported breathing CV: RRR, no mgr GI: BS+, soft, nontender MSK: normal bulk and  tone Neuro: sedated on vent   Resolved Hospital Problem list     Assessment & Plan:  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 Complicated hydrocephalus s/p EVD Acute encephalopathy due subarachnoid hemorrhage EVD Management per neurosurgery Nimodipine SBP goal < 140 Continue sedation with precedex per PAD protocol RASS score -1 to -2  Fever overnight 11/12: unclear etiology, recent U/A normal, CXR normal though has foul smelling respiratory secretions Send resp culture Check d-dimer Check CBC today/tomorrow Hold antibiotics  Acute respiratory failure due to subarachnoid hemorrhage Thyroid mass, tracheal compression, no cuff leak > failed extubation x1 Full mechanical vent support VAP prevention Daily WUA/SBT For tracheostomy and thryoid mass resection today  Hypertensive emergency > worsening control overnight 11/11> 11/12 Continue amlodipine and losartan Add low dose labetalol  Hypernatremia Hypokalemia Increase free water Monitor BMET and UOP Replace electrolytes as needed  Prediabetic with hyperglycemia Monitor  SSI   Best practice:  Diet: tube feeding Pain/Anxiety/Delirium protocol (if indicated): as above VAP protocol (if indicated): yes DVT prophylaxis: sub q heparin/SCD GI prophylaxis: Pantoprazole for stress ulcer prophylaxis Glucose control: SSI Mobility: bed rest Code Status: full Family Communication: per NSGY Disposition:   Labs   CBC: Recent Labs  Lab 02/04/20 0732 02/04/20 0732 02/05/20 0244 02/05/20 0244 02/05/20 1152 02/06/20 0056 02/06/20 0843 02/07/20 0334 02/08/20 0306  WBC 15.8*  --  15.4*  --   --  13.0*  --  15.6* 13.4*  NEUTROABS  --   --   --   --   --  10.8*  --   --  10.7*  HGB 10.1*   < > 10.1*   < > 9.5* 9.3* 9.9* 9.9* 9.3*  HCT 32.6*   < > 33.0*   < > 28.0* 30.4* 29.0* 32.9* 31.3*  MCV 81.5  --  84.4  --   --  83.5  --  86.1 85.5  PLT 165  --  149*  --   --  163  --  188 177    < > = values in this interval not displayed.    Basic Metabolic Panel: Recent Labs  Lab 02/01/20 1701 02/01/20 1701 02/02/20 0424 02/02/20 0424 02/02/20 2201 02/04/20 0732 02/04/20 0732 02/05/20 0244 02/05/20 0244 02/05/20 1152 02/06/20 0056 02/06/20 0843 02/07/20 0334 02/08/20 0306  NA  --   --  139   < >  --  149*   < > 152*   < > 155* 154* 157* 155* 156*  K  --   --  4.3   < >  --  3.9   < > 4.0   < > 3.6 3.4* 3.3* 3.6 3.6  CL  --   --  111   < >  --  112*  --  115*  --   --  118*  --  121* 121*  CO2  --   --  21*   < >  --  26  --  24  --   --  25  --  22 25  GLUCOSE  --   --  193*   < >  --  133*  --  130*  --   --  182*  --  166* 163*  BUN  --   --  23   < >  --  19  --  28*  --   --  38*  --  44* 30*  CREATININE  --   --  0.61   < >  --  0.53  --  0.62  --   --  0.70  --  0.70 0.67  CALCIUM  --   --  8.8*   < >  --  10.0  --  10.2  --   --  10.2  --  9.9 9.4  MG 3.0*   < > 2.7*  --  2.9* 2.9*  --   --   --   --   --   --  2.6* 2.4  PHOS 2.8  --  2.3*  --   --  2.8  --   --   --   --   --   --   --   --    < > = values in this interval not displayed.   GFR: Estimated Creatinine Clearance: 68.7 mL/min (by C-G formula based on SCr of 0.67 mg/dL). Recent Labs  Lab 02/05/20 0244 02/06/20 0056 02/07/20 0334 02/08/20 0306  WBC 15.4* 13.0* 15.6* 13.4*    Liver Function Tests: Recent Labs  Lab 02/02/20 0424 02/08/20 0306  AST  --  20  ALT  --  30  ALKPHOS  --  78  BILITOT  --  0.5  PROT  --  6.5  ALBUMIN 2.7* 2.5*   No results for input(s): LIPASE, AMYLASE in the last 168 hours. No results for input(s): AMMONIA in the last 168 hours.  ABG    Component  Value Date/Time   PHART 7.444 02/06/2020 0843   PCO2ART 42.8 02/06/2020 0843   PO2ART 157 (H) 02/06/2020 0843   HCO3 29.2 (H) 02/06/2020 0843   TCO2 30 02/06/2020 0843   ACIDBASEDEF 4.0 (H) 01/31/2020 0113   O2SAT 99.0 02/06/2020 0843     Coagulation Profile: No results for input(s): INR, PROTIME in  the last 168 hours.  Cardiac Enzymes: No results for input(s): CKTOTAL, CKMB, CKMBINDEX, TROPONINI in the last 168 hours.  HbA1C: Hgb A1c MFr Bld  Date/Time Value Ref Range Status  01/30/2020 08:59 AM 6.2 (H) 4.8 - 5.6 % Final    Comment:    (NOTE) Pre diabetes:          5.7%-6.4%  Diabetes:              >6.4%  Glycemic control for   <7.0% adults with diabetes     CBG: Recent Labs  Lab 02/07/20 1140 02/07/20 1611 02/07/20 2002 02/07/20 2338 02/08/20 0336  GLUCAP 147* 140* 189* 134* 161*     Critical care time: 35 minutes     Roselie Awkward, MD [facility] Pager: [phone] Cell: [phone] If no response, call [phone]

## 2020-02-08 NOTE — Anesthesia Postprocedure Evaluation (Signed)
Anesthesia Post Note  Patient: Stacy Moran  Procedure(s) Performed: TRACHEOSTOMY (N/A ) THYROIDECTOMY (N/A )     Patient location during evaluation: PACU Anesthesia Type: General Level of consciousness: awake and alert Pain management: pain level controlled Vital Signs Assessment: post-procedure vital signs reviewed and stable Respiratory status: spontaneous breathing, nonlabored ventilation, respiratory function stable and patient connected to nasal cannula oxygen Cardiovascular status: blood pressure returned to baseline and stable Postop Assessment: no apparent nausea or vomiting Anesthetic complications: no   No complications documented.  Last Vitals:  Vitals:   02/08/20 0800 02/08/20 1109  BP: 137/78 (!) 157/81  Pulse: 71 86  Resp: 16 19  Temp: 36.6 C   SpO2: 93%     Last Pain:  Vitals:   02/08/20 0800  TempSrc: Axillary  PainSc:                  Tiajuana Amass

## 2020-02-08 NOTE — Progress Notes (Signed)
  NEUROSURGERY PROGRESS NOTE   No issues overnight.   EXAM:  BP 137/78 (BP Location: Left Arm)   Pulse 71   Temp 98.6 F (37 C) (Oral)   Resp 16   Ht 5\' 9"  (1.753 m)   Wt 59.7 kg   SpO2 93%   BMI 19.44 kg/m   Awake, alert Nods appropriately to questions Breathing spontaneously Good strength RUE/RLE 4/5 LUE/LLE Wound c/d/i  IMPRESSION:  64 y.o. female POD# 9 SAH d# 9 s/p clipping LPICA aneurysm, neurologically stable. Planning on trach today  PLAN: - Trach today with ENT, likely require removal of goiter - Cont supportive care - Cont Nimotop - Keep EVD open @ 68mmHg, can begin to wean this weekend after trach. - TCD today

## 2020-02-08 NOTE — Progress Notes (Signed)
Welcome Progress Note Patient Name: Stacy Moran DOB: [birth year] MRN: [id number]   Date of Service  02/08/2020  HPI/Events of Note  Patient needs CXR to check  ET tube position.  eICU Interventions  CXR ordered.        Sherlonda Flater U Mervyn Pflaum 02/08/2020, 1:24 AM

## 2020-02-08 NOTE — Progress Notes (Signed)
Attempted TCD, however patient is in OR. Will attempt again tomorrow.  02/08/2020 1:45 PM Kelby Aline., MHA, RVT, RDCS, RDMS

## 2020-02-08 NOTE — Anesthesia Preprocedure Evaluation (Signed)
Anesthesia Evaluation  Patient identified by MRN, date of birth, ID band Patient awake    Reviewed: Allergy & Precautions, NPO status , Patient's Chart, lab work & pertinent test results  History of Anesthesia Complications Negative for: history of anesthetic complications  Airway Mallampati: Intubated       Dental   Pulmonary neg pulmonary ROS,  Intubated already for MS decline with large SAH, PICA rupture 01/30/2020 SARS coronavirus NEG   breath sounds clear to auscultation       Cardiovascular negative cardio ROS   Rhythm:Regular Rate:Tachycardia  ECHO 11/21 Left Ventricle: LVEF is vigorous with near midcavity obliteration during  systole. Left ventricular ejection fraction, by estimation, is 70 to 75%.  The left ventricle has hyperdynamic function. The left ventricle has no  regional wall motion  abnormalities. The left ventricular internal cavity size was small. There  is mild left ventricular hypertrophy. Left ventricular diastolic  parameters were normal.    Neuro/Psych Ruptured cerebral aneurysm: large SAH, with PICA rupture, has ventric now  negative neurological ROS  negative psych ROS   GI/Hepatic negative GI ROS, Neg liver ROS, unknown   Endo/Other  negative endocrine ROSunknown  Renal/GU negative Renal ROS  negative genitourinary   Musculoskeletal negative musculoskeletal ROS (+)   Abdominal   Peds negative pediatric ROS (+)  Hematology negative hematology ROS (+)   Anesthesia Other Findings   Reproductive/Obstetrics negative OB ROS                             Anesthesia Physical  Anesthesia Plan  ASA: IV  Anesthesia Plan: General   Post-op Pain Management:    Induction: Inhalational  PONV Risk Score and Plan: 3 and Treatment may vary due to age or medical condition  Airway Management Planned: Oral ETT  Additional Equipment: Arterial line  Intra-op Plan:    Post-operative Plan: Post-operative intubation/ventilation  Informed Consent:     Only emergency history available  Plan Discussed with: CRNA and Anesthesiologist  Anesthesia Plan Comments:         Anesthesia Quick Evaluation

## 2020-02-08 NOTE — Transfer of Care (Signed)
Immediate Anesthesia Transfer of Care Note  Patient: Stacy Moran  Procedure(s) Performed: TRACHEOSTOMY (N/A ) THYROIDECTOMY (N/A )  Patient Location: PACU  Anesthesia Type:General  Level of Consciousness: sedated and unresponsive  Airway & Oxygen Therapy: Patient connected to tracheostomy mask oxygen and Patient placed on Ventilator (see vital sign flow sheet for setting)  Post-op Assessment: Report given to RN and Post -op Vital signs reviewed and stable  Post vital signs: Reviewed and stable  Last Vitals:  Vitals Value Taken Time  BP 131/76 02/08/20 1353  Temp    Pulse    Resp 16 02/08/20 1355  SpO2    Vitals shown include unvalidated device data.  Last Pain:  Vitals:   02/08/20 0800  TempSrc: Axillary  PainSc:          Complications: No complications documented.

## 2020-02-08 NOTE — Op Note (Signed)
OPERATIVE REPORT  DATE OF SURGERY: 02/08/2020  PATIENT:  Stacy Moran,  64 y.o. female  PRE-OPERATIVE DIAGNOSIS:  RESPIRATORY FAILURE, goiter  POST-OPERATIVE DIAGNOSIS:  RESPIRATORY FAILURE, goiter  PROCEDURE:  Procedure(s): TRACHEOSTOMY THYROIDECTOMY  SURGEON:  Beckie Salts, MD  ASSISTANTS: RNFA  ANESTHESIA:   General   EBL: 30 ml  DRAINS: Quarter-inch Penrose x2  LOCAL MEDICATIONS USED:  None  SPECIMEN: Total thyroidectomy, suture marks left lobe  COUNTS:  Correct  PROCEDURE DETAILS: The patient was taken to the operating room and placed on the operating table in the supine position.  Patient was previously intubated and general anesthesia was instituted.  A shoulder roll was placed beneath the shoulder blades and the neck was extended. The neck was prepped and draped in a standard fashion. A low collar transverse incision was outlined with a marking pen and was incised with electrocautery. Dissection was continued down through the platysma layer.  Self-retaining retractors were used throughout the case.  The midline fascia was divided.  The strap muscles were reflected laterally but ultimately needed to be divided in order to facilitate removal of this large goiter.  The right side was dissected first starting with the superior vasculature.  Dissection stayed adjacent to the capsule of the gland and ultimately the recurrent nerve was identified and preserved.  This was similar on both sides and at least one parathyroid was identified and preserved with its blood supply on each side.  Middle thyroid veins were ligated and the inferior vasculature was treated in a similar fashion.  The harmonic dissector was used for the majority of the dissection.  There is ligament was divided dissecting the gland off of the trachea on both sides.  The thyroid was soft and multilobulated very large in size and the entire gland was removed.  At this point with the trachea exposed the  tracheostomy was created between the second and third ring and a lower tracheal flap was created and secured to the cervical skin using chromic suture.  A #8 cuffed Shiley tracheostomy tube was placed without difficulty.  This was secured to the neck skin with nylon suture in all 4 corners.  Velcro ties were applied as well.  Prior to the tracheostomy the strap muscles were reapproximated with interrupted 3-0 chromic.  The lateral ends of the incision were reapproximated in 2 layers using 3-0 chromic on the platysmal layer and on the skin in a running fashion.  The central part was left open where the tracheostomy was placed.  Quarter inch Penrose drain was placed in either side of the thyroid bed exiting through the lateral aspect of the incision and no suture was used to secure it is in place.  Dressings were applied.     PATIENT DISPOSITION:  To PACU/ICU, stable

## 2020-02-08 NOTE — Procedures (Signed)
Cortrak  Person Inserting Tube:  Maylon Peppers C, RD Tube Type:  Cortrak - 43 inches Tube Location:  Left nare Initial Placement:  Stomach Secured by: Bridle Technique Used to Measure Tube Placement:  Documented cm marking at nare/ corner of mouth Cortrak Secured At:  64 cm    Cortrak Tube Team Note:  Consult received to place a Cortrak feeding tube.   No x-ray is required. RN may begin using tube.   If the tube becomes dislodged please keep the tube and contact the Cortrak team at [email or web address] (password [email or web address]) for replacement.  If after hours and replacement cannot be delayed, place a NG tube and confirm placement with an abdominal x-ray.    Lockie Pares., RD, LDN, CNSC See AMiON for contact information

## 2020-02-08 NOTE — Progress Notes (Signed)
Dr. Christella Noa on unit. This RN verified BP goals with him. This RN was told unless Dr. Constance Holster of ENT wished it otherwise for procedures done today, SBP can still be up to 220. [facility] H&P and Op note reviewed. I do not see any blood pressure goals per ENT. Will continue to monitor.

## 2020-02-09 ENCOUNTER — Other Ambulatory Visit (HOSPITAL_COMMUNITY): Payer: Self-pay

## 2020-02-09 ENCOUNTER — Inpatient Hospital Stay (HOSPITAL_COMMUNITY): Payer: Self-pay

## 2020-02-09 DIAGNOSIS — Z930 Tracheostomy status: Secondary | ICD-10-CM

## 2020-02-09 DIAGNOSIS — R509 Fever, unspecified: Secondary | ICD-10-CM

## 2020-02-09 DIAGNOSIS — Z9911 Dependence on respirator [ventilator] status: Secondary | ICD-10-CM

## 2020-02-09 DIAGNOSIS — J988 Other specified respiratory disorders: Secondary | ICD-10-CM

## 2020-02-09 DIAGNOSIS — I609 Nontraumatic subarachnoid hemorrhage, unspecified: Secondary | ICD-10-CM

## 2020-02-09 LAB — GLUCOSE, CAPILLARY
Glucose-Capillary: 236 mg/dL — ABNORMAL HIGH (ref 70–99)
Glucose-Capillary: 216 mg/dL — ABNORMAL HIGH (ref 70–99)
Glucose-Capillary: 210 mg/dL — ABNORMAL HIGH (ref 70–99)
Glucose-Capillary: 162 mg/dL — ABNORMAL HIGH (ref 70–99)
Glucose-Capillary: 168 mg/dL — ABNORMAL HIGH (ref 70–99)
Glucose-Capillary: 215 mg/dL — ABNORMAL HIGH (ref 70–99)

## 2020-02-09 LAB — COMPREHENSIVE METABOLIC PANEL WITH GFR
ALT: 26 U/L (ref 0–44)
AST: 20 U/L (ref 15–41)
Albumin: 2.4 g/dL — ABNORMAL LOW (ref 3.5–5.0)
Alkaline Phosphatase: 78 U/L (ref 38–126)
Anion gap: 12 (ref 5–15)
BUN: 29 mg/dL — ABNORMAL HIGH (ref 8–23)
CO2: 23 mmol/L (ref 22–32)
Calcium: 8.6 mg/dL — ABNORMAL LOW (ref 8.9–10.3)
Chloride: 120 mmol/L — ABNORMAL HIGH (ref 98–111)
Creatinine, Ser: 0.89 mg/dL (ref 0.44–1.00)
GFR, Estimated: >60 mL/min
Glucose, Bld: 271 mg/dL — ABNORMAL HIGH (ref 70–99)
Potassium: 3 mmol/L — ABNORMAL LOW (ref 3.5–5.1)
Sodium: 155 mmol/L — ABNORMAL HIGH (ref 135–145)
Total Bilirubin: 0.4 mg/dL (ref 0.3–1.2)
Total Protein: 6.4 g/dL — ABNORMAL LOW (ref 6.5–8.1)

## 2020-02-09 LAB — CULTURE, BLOOD (ROUTINE X 2)
Culture: NO GROWTH
Culture: NO GROWTH
Special Requests: ADEQUATE
Special Requests: ADEQUATE

## 2020-02-09 LAB — CBC
HCT: 30.4 % — ABNORMAL LOW (ref 36.0–46.0)
Hemoglobin: 9.3 g/dL — ABNORMAL LOW (ref 12.0–15.0)
MCH: 26.1 pg (ref 26.0–34.0)
MCHC: 30.6 g/dL (ref 30.0–36.0)
MCV: 85.2 fL (ref 80.0–100.0)
Platelets: 164 10*3/uL (ref 150–400)
RBC: 3.57 MIL/uL — ABNORMAL LOW (ref 3.87–5.11)
RDW: 15.7 % — ABNORMAL HIGH (ref 11.5–15.5)
WBC: 19.6 10*3/uL — ABNORMAL HIGH (ref 4.0–10.5)
nRBC: 0.1 % (ref 0.0–0.2)

## 2020-02-09 LAB — CALCIUM
Calcium: 8.4 mg/dL — ABNORMAL LOW (ref 8.9–10.3)
Calcium: 8.6 mg/dL — ABNORMAL LOW (ref 8.9–10.3)

## 2020-02-09 MED ORDER — PIPERACILLIN-TAZOBACTAM 3.375 G IVPB 30 MIN
3.3750 g | Freq: Once | INTRAVENOUS | Status: AC
  Administered 2020-02-09: 3.375 g via INTRAVENOUS
  Filled 2020-02-09 (×2): qty 50

## 2020-02-09 MED ORDER — INSULIN ASPART 100 UNIT/ML ~~LOC~~ SOLN
0.0000 [IU] | SUBCUTANEOUS | Status: DC
  Administered 2020-02-09: 4 [IU] via SUBCUTANEOUS
  Administered 2020-02-09: 7 [IU] via SUBCUTANEOUS
  Administered 2020-02-09 – 2020-02-10 (×2): 4 [IU] via SUBCUTANEOUS
  Administered 2020-02-10: 3 [IU] via SUBCUTANEOUS
  Administered 2020-02-10: 7 [IU] via SUBCUTANEOUS
  Administered 2020-02-10: 3 [IU] via SUBCUTANEOUS
  Administered 2020-02-10 (×2): 4 [IU] via SUBCUTANEOUS
  Administered 2020-02-11 (×3): 3 [IU] via SUBCUTANEOUS
  Administered 2020-02-11: 7 [IU] via SUBCUTANEOUS
  Administered 2020-02-11 – 2020-02-14 (×8): 3 [IU] via SUBCUTANEOUS
  Administered 2020-02-14: 4 [IU] via SUBCUTANEOUS
  Administered 2020-02-15: 3 [IU] via SUBCUTANEOUS
  Administered 2020-02-16 – 2020-02-23 (×2): 4 [IU] via SUBCUTANEOUS
  Administered 2020-02-23 – 2020-02-26 (×7): 3 [IU] via SUBCUTANEOUS
  Administered 2020-02-26 – 2020-02-27 (×2): 4 [IU] via SUBCUTANEOUS
  Administered 2020-02-27 (×2): 3 [IU] via SUBCUTANEOUS
  Administered 2020-02-28 (×2): 4 [IU] via SUBCUTANEOUS
  Administered 2020-02-29: 3 [IU] via SUBCUTANEOUS
  Administered 2020-02-29: 4 [IU] via SUBCUTANEOUS
  Administered 2020-03-01 (×3): 3 [IU] via SUBCUTANEOUS
  Administered 2020-03-01: 4 [IU] via SUBCUTANEOUS
  Administered 2020-03-02 (×2): 3 [IU] via SUBCUTANEOUS
  Administered 2020-03-03 (×2): 4 [IU] via SUBCUTANEOUS
  Administered 2020-03-03: 3 [IU] via SUBCUTANEOUS
  Administered 2020-03-03 – 2020-03-04 (×2): 4 [IU] via SUBCUTANEOUS
  Administered 2020-03-04 (×2): 3 [IU] via SUBCUTANEOUS
  Administered 2020-03-04 – 2020-03-05 (×2): 4 [IU] via SUBCUTANEOUS
  Administered 2020-03-05 – 2020-03-06 (×5): 3 [IU] via SUBCUTANEOUS
  Administered 2020-03-06 (×2): 4 [IU] via SUBCUTANEOUS
  Administered 2020-03-06 – 2020-03-08 (×6): 3 [IU] via SUBCUTANEOUS
  Administered 2020-03-09: 4 [IU] via SUBCUTANEOUS
  Administered 2020-03-09: 0 [IU] via SUBCUTANEOUS
  Administered 2020-03-09 – 2020-03-10 (×5): 3 [IU] via SUBCUTANEOUS
  Administered 2020-03-11: 4 [IU] via SUBCUTANEOUS
  Administered 2020-03-11 – 2020-03-12 (×4): 3 [IU] via SUBCUTANEOUS
  Administered 2020-03-12: 4 [IU] via SUBCUTANEOUS
  Administered 2020-03-12: 3 [IU] via SUBCUTANEOUS
  Administered 2020-03-12 – 2020-03-14 (×4): 4 [IU] via SUBCUTANEOUS
  Administered 2020-03-14: 3 [IU] via SUBCUTANEOUS
  Administered 2020-03-14: 7 [IU] via SUBCUTANEOUS
  Administered 2020-03-14 – 2020-03-15 (×6): 3 [IU] via SUBCUTANEOUS
  Administered 2020-03-16: 4 [IU] via SUBCUTANEOUS
  Administered 2020-03-16 – 2020-03-17 (×5): 3 [IU] via SUBCUTANEOUS
  Administered 2020-03-18: 4 [IU] via SUBCUTANEOUS
  Administered 2020-03-18: 3 [IU] via SUBCUTANEOUS
  Administered 2020-03-19 (×3): 4 [IU] via SUBCUTANEOUS
  Administered 2020-03-19 – 2020-03-20 (×3): 3 [IU] via SUBCUTANEOUS
  Administered 2020-03-20: 4 [IU] via SUBCUTANEOUS
  Administered 2020-03-20 – 2020-03-23 (×9): 3 [IU] via SUBCUTANEOUS
  Administered 2020-03-23: 4 [IU] via SUBCUTANEOUS
  Administered 2020-03-23: 3 [IU] via SUBCUTANEOUS
  Administered 2020-03-23: 4 [IU] via SUBCUTANEOUS
  Administered 2020-03-24: 3 [IU] via SUBCUTANEOUS
  Administered 2020-03-24 (×2): 4 [IU] via SUBCUTANEOUS
  Administered 2020-03-25 (×2): 3 [IU] via SUBCUTANEOUS
  Administered 2020-03-25: 2 [IU] via SUBCUTANEOUS
  Administered 2020-03-26 (×2): 3 [IU] via SUBCUTANEOUS
  Administered 2020-03-27: 4 [IU] via SUBCUTANEOUS
  Administered 2020-03-27 (×2): 3 [IU] via SUBCUTANEOUS
  Administered 2020-03-27: 4 [IU] via SUBCUTANEOUS
  Administered 2020-03-28: 3 [IU] via SUBCUTANEOUS
  Administered 2020-03-28: 4 [IU] via SUBCUTANEOUS
  Administered 2020-03-28: 3 [IU] via SUBCUTANEOUS
  Administered 2020-03-28: 4 [IU] via SUBCUTANEOUS
  Administered 2020-03-29: 3 [IU] via SUBCUTANEOUS
  Administered 2020-03-29: 4 [IU] via SUBCUTANEOUS
  Administered 2020-03-30 (×2): 3 [IU] via SUBCUTANEOUS
  Administered 2020-03-30: 4 [IU] via SUBCUTANEOUS
  Administered 2020-03-31: 3 [IU] via SUBCUTANEOUS
  Administered 2020-03-31 – 2020-04-01 (×3): 4 [IU] via SUBCUTANEOUS
  Administered 2020-04-01: 3 [IU] via SUBCUTANEOUS
  Administered 2020-04-01: 4 [IU] via SUBCUTANEOUS
  Administered 2020-04-02 (×2): 3 [IU] via SUBCUTANEOUS
  Administered 2020-04-02: 4 [IU] via SUBCUTANEOUS
  Administered 2020-04-03 – 2020-04-04 (×6): 3 [IU] via SUBCUTANEOUS
  Administered 2020-04-04: 4 [IU] via SUBCUTANEOUS
  Administered 2020-04-04 – 2020-04-07 (×8): 3 [IU] via SUBCUTANEOUS
  Administered 2020-04-07 (×2): 4 [IU] via SUBCUTANEOUS
  Administered 2020-04-08 (×3): 3 [IU] via SUBCUTANEOUS
  Administered 2020-04-08: 4 [IU] via SUBCUTANEOUS
  Administered 2020-04-09: 3 [IU] via SUBCUTANEOUS
  Administered 2020-04-09: 4 [IU] via SUBCUTANEOUS
  Administered 2020-04-09: 3 [IU] via SUBCUTANEOUS
  Administered 2020-04-10: 4 [IU] via SUBCUTANEOUS
  Administered 2020-04-10 – 2020-04-11 (×2): 3 [IU] via SUBCUTANEOUS
  Administered 2020-04-11: 4 [IU] via SUBCUTANEOUS
  Administered 2020-04-11: 3 [IU] via SUBCUTANEOUS
  Administered 2020-04-12: 4 [IU] via SUBCUTANEOUS
  Administered 2020-04-12 – 2020-04-13 (×5): 3 [IU] via SUBCUTANEOUS
  Administered 2020-04-14: 4 [IU] via SUBCUTANEOUS
  Administered 2020-04-14 (×2): 3 [IU] via SUBCUTANEOUS
  Administered 2020-04-14: 4 [IU] via SUBCUTANEOUS
  Administered 2020-04-15: 3 [IU] via SUBCUTANEOUS
  Administered 2020-04-15: 4 [IU] via SUBCUTANEOUS
  Administered 2020-04-16 – 2020-04-18 (×9): 3 [IU] via SUBCUTANEOUS
  Administered 2020-04-18: 4 [IU] via SUBCUTANEOUS
  Administered 2020-04-19: 3 [IU] via SUBCUTANEOUS
  Administered 2020-04-19 (×2): 4 [IU] via SUBCUTANEOUS
  Administered 2020-04-19 (×2): 3 [IU] via SUBCUTANEOUS
  Administered 2020-04-20: 4 [IU] via SUBCUTANEOUS
  Administered 2020-04-20 – 2020-04-21 (×3): 3 [IU] via SUBCUTANEOUS
  Administered 2020-04-21: 2 [IU] via SUBCUTANEOUS
  Administered 2020-04-22: 4 [IU] via SUBCUTANEOUS
  Administered 2020-04-22: 3 [IU] via SUBCUTANEOUS

## 2020-02-09 MED ORDER — POTASSIUM CHLORIDE 20 MEQ PO PACK
40.0000 meq | PACK | Freq: Four times a day (QID) | ORAL | Status: AC
  Administered 2020-02-09 (×2): 40 meq
  Filled 2020-02-09 (×2): qty 2

## 2020-02-09 MED ORDER — LEVOTHYROXINE SODIUM 100 MCG PO TABS
100.0000 ug | ORAL_TABLET | Freq: Every day | ORAL | Status: DC
  Administered 2020-02-09 – 2020-04-22 (×69): 100 ug
  Filled 2020-02-09 (×68): qty 1

## 2020-02-09 MED ORDER — PIPERACILLIN-TAZOBACTAM 3.375 G IVPB
3.3750 g | Freq: Three times a day (TID) | INTRAVENOUS | Status: DC
  Administered 2020-02-09 – 2020-02-10 (×3): 3.375 g via INTRAVENOUS
  Filled 2020-02-09 (×3): qty 50

## 2020-02-09 MED ORDER — INSULIN DETEMIR 100 UNIT/ML ~~LOC~~ SOLN
5.0000 [IU] | Freq: Every day | SUBCUTANEOUS | Status: DC
  Administered 2020-02-09: 5 [IU] via SUBCUTANEOUS
  Filled 2020-02-09 (×2): qty 0.05

## 2020-02-09 NOTE — Progress Notes (Signed)
Pharmacy Antibiotic Note  Stacy Moran is a 64 y.o. female admitted on 01/30/2020 with pneumonia.  Pharmacy has been consulted for Zosyn dosing. SCr 0.89 stable.  Plan: Zosyn 3.375g IV (30min infusion) x1; then 3.375g IV q8h (4h infusion) Monitor clinical progress, c/s, renal function F/u de-escalation plan/LOT  Height: 5\' 9"  (175.3 cm) Weight: 59.7 kg (131 lb 9.8 oz) IBW/kg (Calculated) : 66.2  Temp (24hrs), Avg:99.4 F (37.4 C), Min:97.8 F (36.6 C), Max:101.2 F (38.4 C)  Recent Labs  Lab 02/05/20 0244 02/05/20 0244 02/06/20 0056 02/07/20 0334 02/08/20 0306 02/08/20 1509 02/09/20 0601  WBC 15.4*   < > 13.0* 15.6* 13.4* 16.8* 19.6*  CREATININE 0.62  --  0.70 0.70 0.67  --  0.89   < > = values in this interval not displayed.    Estimated Creatinine Clearance: 61.8 mL/min (by C-G formula based on SCr of 0.89 mg/dL).    No Known Allergies   Arturo Morton, PharmD, BCPS Please check AMION for all [place] contact numbers Clinical Pharmacist 02/09/2020 7:54 AM

## 2020-02-09 NOTE — Progress Notes (Signed)
Transcranial Doppler  Date POD PCO2 HCT BP  MCA ACA PCA OPHT SIPH VERT Basilar  11/4 RH     Right  Left   *  *   *  *   *  *   20  16   86  23   *  21   *      11/5,rs     Right  Left   *  *   *  *   *  *   23  19   99  28   *     *      11/8/ GC     Right  Left   *  *   *  *   *  *   12  10   31   *   *     *       11/10 MS     Right  Left   *  *   *  *   *  *   17  *   20  *   *  *   *      11/13 GC      Right  Left   *  *   *  *   *  *   21  20   16  13    *  *   *           Right  Left                                            Right  Left                                        MCA = Middle Cerebral Artery      OPHT = Opthalmic Artery     BASILAR = Basilar Artery   ACA = Anterior Cerebral Artery     SIPH = Carotid Siphon PCA = Posterior Cerebral Artery   VERT = Verterbral Artery                 *- Unable to insonate   Normal MCA = 62+\-12 ACA = 50+\-12 PCA = 42+\-23    02/01/20 - (*) unable to insonate - poor windows. Rita Sturdivant, BS, RDMS, RVT  02/04/2020 - * Unable to insonate.  02/06/20- *Unable to insonate  02/09/2020 - * Unable to insonate.  02/09/20 4:31 PM Stacy Moran RVT

## 2020-02-09 NOTE — Progress Notes (Signed)
NAME:  Stacy Moran, MRN:  [id number], DOB:  1955-11-04, LOS: 66 ADMISSION DATE:  01/30/2020, CONSULTATION DATE:  01/30/20 REFERRING MD:  Ralene Ok, CHIEF COMPLAINT:  Headache   Brief History   64 y/o presented with large subarachnoid hemorrhage due to ruptured PICA with resultant hydrocephalus.    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  S/p trach and thyroidectomy.  Remains on precedex.  Objective   Blood pressure (!) 166/77, pulse 98, temperature 99.2 F (37.3 C), temperature source Oral, resp. rate 15, height 5\' 9"  (1.753 m), weight 59.7 kg, SpO2 99 %.    Vent Mode: PRVC FiO2 (%):  [21 %-30 %] 21 % Set Rate:  [16 bmp] 16 bmp Vt Set:  [520 mL] 520 mL PEEP:  [5 cmH20] 5 cmH20 Plateau Pressure:  [14 cmH20-23 cmH20] 20 cmH20   Intake/Output Summary (Last 24 hours) at 02/09/2020 0744 Last data filed at 02/09/2020 0700 Gross per 24 hour  Intake 2640.3 ml  Output 933 ml  Net 1707.3 ml   Filed Weights   02/06/20 0500 02/07/20 0326 02/08/20 0500  Weight: 59.8 kg 60.6 kg 59.7 kg    Examination:  General - sedated Eyes - pupils reactive ENT - trach site clean Cardiac - regular rate/rhythm, no murmur Chest - b/l  crackles Abdomen - soft, non tender, + bowel sounds Extremities - no cyanosis, clubbing, or edema Skin - no rashes Neuro - RASS -2   Resolved Hospital Problem list     Assessment & Plan:   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 Acute encephalopathy due subarachnoid hemorrhage. - EVD, post op care per neurosurgery - continue nimodipine - RASS goal 0 to -1  Fever with concern for tracheobronchitis/HCAP. - will add zosyn pending sputum culture results  Acute respiratory failure due to subarachnoid hemorrhage. Failure to wean s/p tracheostomy. - pressure support wean to trach collar as tolerated - trach care - f/u CXR - goal SpO2 > 92%  Thyroid mass with tracheal compression. - s/p total thyroidectomy 11/12 - post op care per ENT - continue synthroid - monitor calcium  Hypertension. - goal SBP < 140 - continue losartan, amlodipine, labetalol  Hypernatremia. - continue free water - f/u BMET  Hypokalemia. - replace as needed  Newly diagnosed DM type 2 hyperglycemia. - change to resistant SSI and add 5 units levemir daily  Anemia of critical illness. - f/u CBC - transfuse for Hb < 7 or significant bleeding  Urine retention. - continue bethanechol  Best practice:  Diet: tube feeding DVT prophylaxis: sub q heparin/SCD GI prophylaxis: Pantoprazole  Mobility: bed rest Code Status: full Disposition: ICU  Labs    CMP Latest Ref Rng & Units 02/09/2020 02/08/2020 02/08/2020  Glucose 70 - 99 mg/dL  271(H) - 163(H)  BUN 8 - 23 mg/dL 29(H) - 30(H)  Creatinine 0.44 - 1.00 mg/dL 0.89 - 0.67  Sodium 135 - 145 mmol/L 155(H) - 156(H)  Potassium 3.5 - 5.1 mmol/L 3.0(L) - 3.6  Chloride 98 - 111 mmol/L 120(H) - 121(H)  CO2 22 - 32 mmol/L 23 - 25  Calcium 8.9 - 10.3 mg/dL 8.6(L) 8.6(L) 9.4  Total Protein 6.5 - 8.1 g/dL 6.4(L) - 6.5  Total Bilirubin 0.3 - 1.2 mg/dL 0.4 - 0.5  Alkaline Phos 38 -  126 U/L 78 - 78  AST 15 - 41 U/L 20 - 20  ALT 0 - 44 U/L 26 - 30    CBC Latest Ref Rng & Units 02/09/2020 02/08/2020 02/08/2020  WBC 4.0 - 10.5 K/uL 19.6(H) 16.8(H) 13.4(H)  Hemoglobin 12.0 - 15.0 g/dL 9.3(L) 9.8(L) 9.3(L)  Hematocrit 36 - 46 % 30.4(L) 33.1(L) 31.3(L)  Platelets 150 - 400 K/uL 164 PLATELET CLUMPS NOTED ON SMEAR, COUNT APPEARS ADEQUATE 177    ABG    Component Value Date/Time   PHART 7.444 02/06/2020 0843   PCO2ART 42.8 02/06/2020 0843   PO2ART 157 (H) 02/06/2020 0843   HCO3 29.2 (H) 02/06/2020 0843   TCO2 30 02/06/2020 0843   ACIDBASEDEF 4.0 (H) 01/31/2020 0113   O2SAT 99.0 02/06/2020 0843    CBG (last 3)  Recent Labs    02/08/20 2325 02/09/20 0339 02/09/20 0724  GLUCAP 173* 236* 216*    Critical care time: 34 minutes   Chesley Mires, MD [place] Pager - [phone] - [phone] 02/09/2020, 8:02 AM

## 2020-02-09 NOTE — Progress Notes (Signed)
Patient ID: Stacy Moran, female   DOB: [birth year], 64 y.o.   MRN: [id number] BP (!) 141/76   Pulse 91   Temp (!) 101.6 F (38.7 C) (Axillary) Comment: Nurse notified  Resp (!) 25   Ht 5\' 9"  (1.753 m)   Wt 59.7 kg   SpO2 100%   BMI 19.44 kg/m  Alert and following commands Trach in place, appears to be breathing comfortably Moving left side, and right side Wound is clean, dry

## 2020-02-09 NOTE — Progress Notes (Signed)
ENT PROGRESS NOTE   Subjective: Patient seen and examined at bedside. No issues with trach overnight. Patient on Precedex but weaning, able to communicate with hand and head movements. Currently on pressure support.   Objective: Vital signs in last 24 hours: Temp:  [99.2 F (37.3 C)-101.6 F (38.7 C)] 101.6 F (38.7 C) (11/13 0800) Pulse Rate:  [80-118] 87 (11/13 1000) Resp:  [15-33] 22 (11/13 1000) BP: (141-190)/(63-112) 156/75 (11/13 1000) SpO2:  [94 %-100 %] 100 % (11/13 1000) FiO2 (%):  [21 %-30 %] 21 % (11/13 0827)  Physical Exam:  General appearance: Sedated but awake for my exam, able to answer questions with head movements, hand gestures Head: Incision C/D/I Neck: Shiley 8-0 trach in place, patent, secured with sutures and trach ties. Neck with mild edema, no hematoma or seroma appreciated. Penrose drains exiting neck bilaterally with serosanguinous drainage. Resp: On vent Neurologic: Motor: Nodding, gesturing with right hand   Recent Labs    02/08/20 0306 02/08/20 0306 02/08/20 2110 02/09/20 0601  NA 156*  --   --  155*  K 3.6  --   --  3.0*  CL 121*  --   --  120*  CO2 25  --   --  23  GLUCOSE 163*  --   --  271*  BUN 30*  --   --  29*  CREATININE 0.67  --   --  0.89  CALCIUM 9.4   < > 8.6* 8.6*   < > = values in this interval not displayed.    Medications: I have reviewed the patient's current medications.  New Imaging: No new imaging  Pathology: Pending  Assessment/Plan: Stacy Moran is a 64 y/o F with large subarachnoid hemorrhage, multinodular goiter and respiratory failure now POD #1 s/p total thyroidectomy and tracheostomy   -Continue routine tracheostomy care -Wean vent support as per critical care, OK to deflate cuff when no longer requiring vent support -Dressing changes PRN -Will plan on removal of penrose drains tomorrow -Do not cut trach sutures until postoperative day #5 -Continue synthroid -Corrected calcium 9.9 this AM, continue to  monitor -Medical management as per primary team    LOS: 10 days   Thank you for allowing me to participate in the care of this patient. Please do not hesitate to contact me with any questions or concerns.   Jason Coop, [facility] Cell: [phone]  02/09/2020, 10:43 AM

## 2020-02-10 ENCOUNTER — Inpatient Hospital Stay (HOSPITAL_COMMUNITY): Payer: Self-pay

## 2020-02-10 DIAGNOSIS — J40 Bronchitis, not specified as acute or chronic: Secondary | ICD-10-CM

## 2020-02-10 LAB — GLUCOSE, CAPILLARY
Glucose-Capillary: 135 mg/dL — ABNORMAL HIGH (ref 70–99)
Glucose-Capillary: 151 mg/dL — ABNORMAL HIGH (ref 70–99)
Glucose-Capillary: 154 mg/dL — ABNORMAL HIGH (ref 70–99)
Glucose-Capillary: 154 mg/dL — ABNORMAL HIGH (ref 70–99)
Glucose-Capillary: 156 mg/dL — ABNORMAL HIGH (ref 70–99)
Glucose-Capillary: 175 mg/dL — ABNORMAL HIGH (ref 70–99)

## 2020-02-10 LAB — BASIC METABOLIC PANEL
Anion gap: 8 (ref 5–15)
BUN: 23 mg/dL (ref 8–23)
CO2: 23 mmol/L (ref 22–32)
Calcium: 8.3 mg/dL — ABNORMAL LOW (ref 8.9–10.3)
Chloride: 123 mmol/L — ABNORMAL HIGH (ref 98–111)
Creatinine, Ser: 0.85 mg/dL (ref 0.44–1.00)
GFR, Estimated: >60 mL/min (ref >60)
Glucose, Bld: 217 mg/dL — ABNORMAL HIGH (ref 70–99)
Potassium: 3.5 mmol/L (ref 3.5–5.1)
Sodium: 154 mmol/L — ABNORMAL HIGH (ref 135–145)

## 2020-02-10 LAB — CBC
HCT: 27.3 % — ABNORMAL LOW (ref 36.0–46.0)
Hemoglobin: 8.3 g/dL — ABNORMAL LOW (ref 12.0–15.0)
MCH: 25.7 pg — ABNORMAL LOW (ref 26.0–34.0)
MCHC: 30.4 g/dL (ref 30.0–36.0)
MCV: 84.5 fL (ref 80.0–100.0)
Platelets: 151 10*3/uL (ref 150–400)
RBC: 3.23 MIL/uL — ABNORMAL LOW (ref 3.87–5.11)
RDW: 15.5 % (ref 11.5–15.5)
WBC: 14.8 10*3/uL — ABNORMAL HIGH (ref 4.0–10.5)
nRBC: 0.1 % (ref 0.0–0.2)

## 2020-02-10 LAB — CULTURE, RESPIRATORY W GRAM STAIN

## 2020-02-10 LAB — CALCIUM
Calcium: 8.5 mg/dL — ABNORMAL LOW (ref 8.9–10.3)
Calcium: 8.7 mg/dL — ABNORMAL LOW (ref 8.9–10.3)

## 2020-02-10 LAB — MAGNESIUM: Magnesium: 2.4 mg/dL (ref 1.7–2.4)

## 2020-02-10 IMAGING — DX DG CHEST 1V PORT
1 series · 1 of 1 positions shown · non-contrast
Comparison: [DATE]

CLINICAL DATA: Respiratory failure

EXAM:
PORTABLE CHEST 1 VIEW

[chest ap]
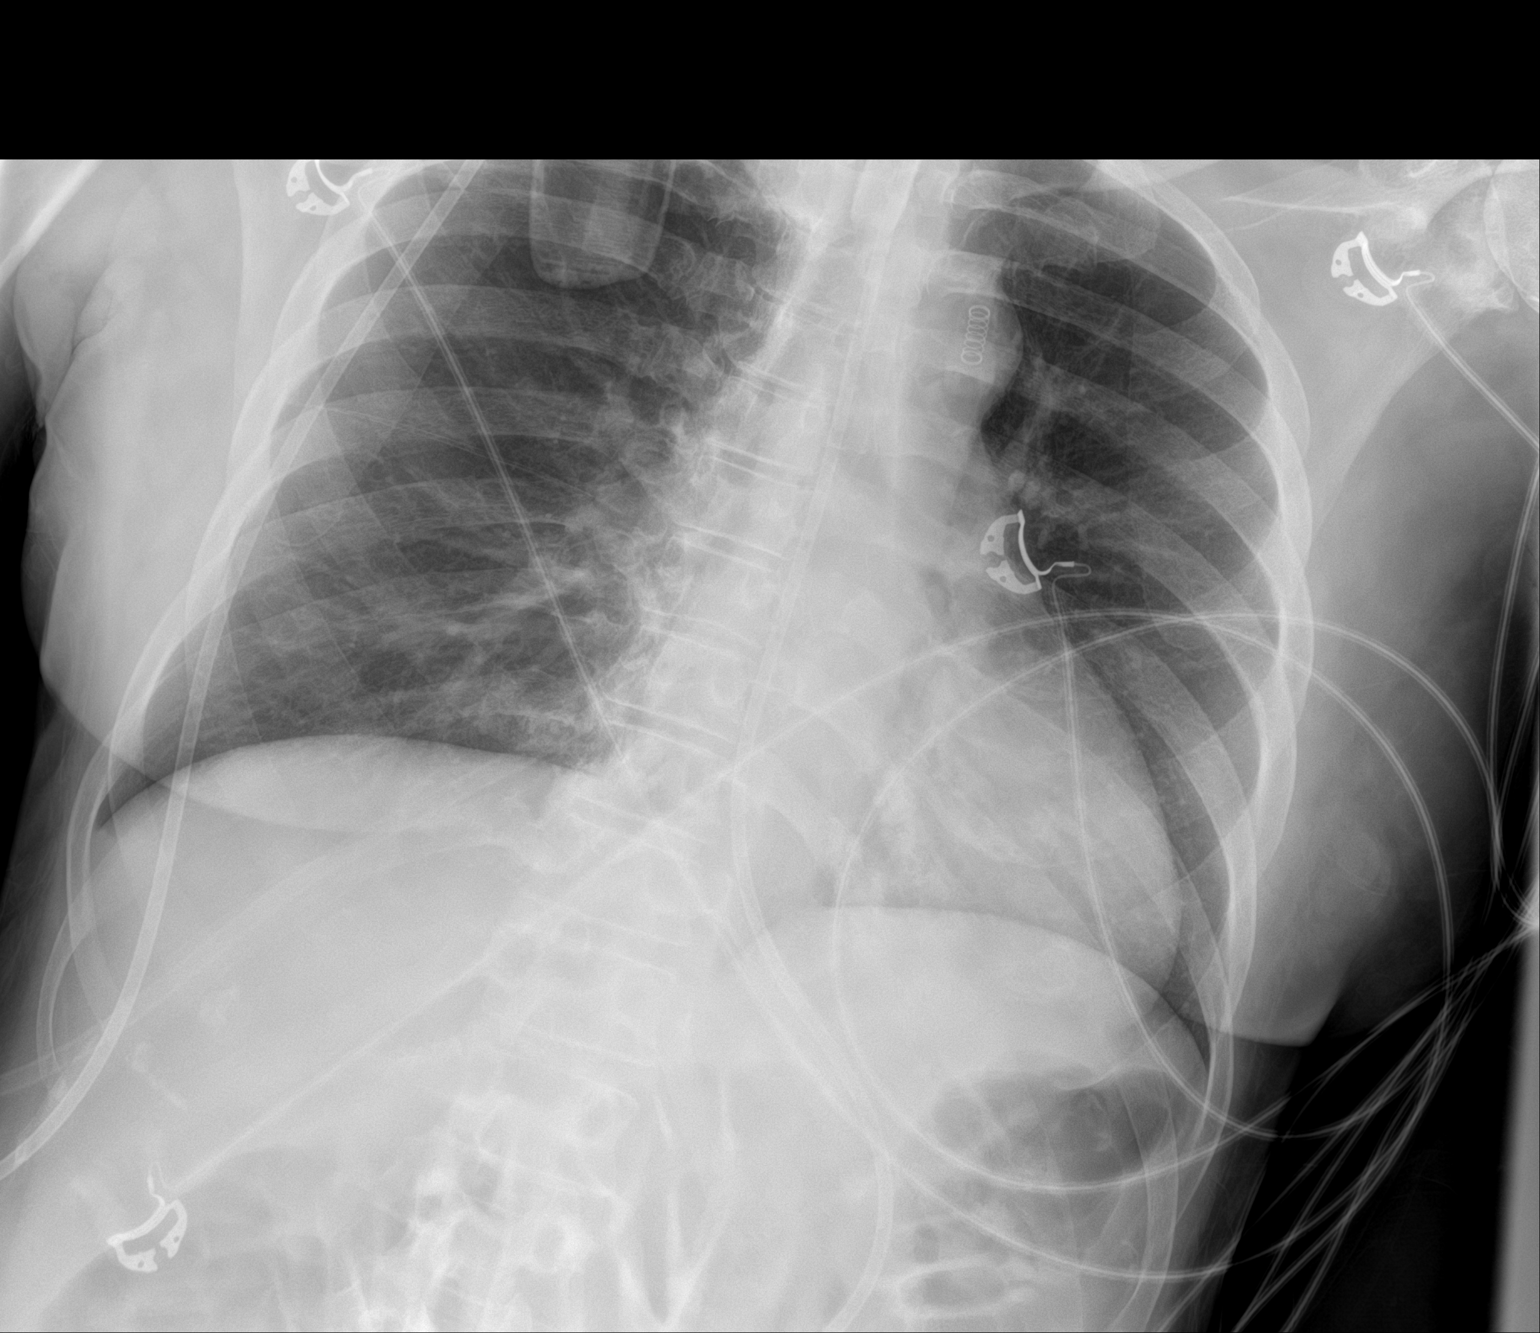

[1 of 1 positions shown; findings below may reference images not displayed]

FINDINGS: There is now a tracheostomy present with catheter tip 4.6 cm above
the carina. Enteric tube tip is below the diaphragm. No
pneumothorax. The lungs are clear. The heart is upper normal in size
with pulmonary vascularity normal. No adenopathy. No bone lesions.
IMPRESSION: Tube positions as described without pneumothorax. No edema or
airspace opacity. Heart upper normal in size.

## 2020-02-10 MED ORDER — POTASSIUM CHLORIDE 20 MEQ PO PACK
40.0000 meq | PACK | Freq: Once | ORAL | Status: AC
  Administered 2020-02-10: 40 meq
  Filled 2020-02-10: qty 2

## 2020-02-10 MED ORDER — INSULIN DETEMIR 100 UNIT/ML ~~LOC~~ SOLN
10.0000 [IU] | Freq: Every day | SUBCUTANEOUS | Status: DC
  Administered 2020-02-10: 10 [IU] via SUBCUTANEOUS
  Filled 2020-02-10 (×2): qty 0.1

## 2020-02-10 MED ORDER — SODIUM CHLORIDE 0.9 % IV SOLN
2.0000 g | INTRAVENOUS | Status: AC
  Administered 2020-02-10 – 2020-02-15 (×6): 2 g via INTRAVENOUS
  Filled 2020-02-10 (×4): qty 2
  Filled 2020-02-10: qty 20
  Filled 2020-02-10: qty 2
  Filled 2020-02-10: qty 20

## 2020-02-10 NOTE — Progress Notes (Signed)
Patient ID: Stacy Moran, female   DOB: 02/04/1956, 64 y.o.   MRN: [id number] BP 130/68 (BP Location: Left Arm)   Pulse 80   Temp 100.1 F (37.8 C) (Axillary)   Resp (!) 22   Ht 5\' 9"  (1.753 m)   Wt 59.7 kg   SpO2 98%   BMI 19.44 kg/m  Alert and oriented Following commands ventric in place, will clamp Doing well

## 2020-02-10 NOTE — Progress Notes (Signed)
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    Past Medical History  No known [facility] Events   11/03 Admitted 11/04 craniotomy with clipping of left PICA 11/12 total thyroidectomy, tracheostomy 11/13 start ABx for fever and tracheobronchitis  Consults:  ENT Constance Holster)  Procedures:  11/03 ETT >>11/09 11/03 EVD >> 11/09 ETT >> 11/12 11/12 Tracheostomy >>  Significant Diagnostic Tests:  11/3 CT head > large volume subarachnoid related to ruptured left PICA aneurysm, intraventricular reflux with hydrocephalus, chronic small vessel disease, chronic small vessel disease 11/9 H&N U/S > 2.3x2.3x2.4 mass in the left neck along the left thyroid lobe  Micro Data:  11/03 SARS2/ Flu > neg 11/08 respiratory culture >> negative 11/08 blood culture >> 11/11 urine culture >> multiple species 11/12 MRSA PCR >> negative 11/12 sputum >> Staph aureus, GNR >>   Antimicrobials:  Zosyn 11/13 >>   Interim history/subjective:  Tm 101.2.  Tolerating pressure support.  Objective   Blood pressure 118/67, pulse 74, temperature (!) 101.2 F (38.4 C), temperature source Oral, resp. rate (!) 23, height 5\' 9"  (1.753 m), weight 59.7 kg, SpO2 100 %.    Vent Mode: PSV;CPAP FiO2 (%):  [21 %] 21 % Set Rate:  [16 bmp] 16 bmp Vt Set:  [520 mL] 520 mL PEEP:  [5 cmH20] 5 cmH20 Pressure Support:  [10 cmH20] 10 cmH20 Plateau Pressure:  [18 cmH20-19 cmH20] 19 cmH20   Intake/Output Summary (Last 24 hours) at 02/10/2020 1062 Last data filed at 02/10/2020 0800 Gross per 24 hour  Intake 1848.14 ml  Output 1495 ml  Net 353.14 ml   Filed Weights   02/06/20 0500 02/07/20 0326 02/08/20 0500  Weight: 59.8 kg 60.6 kg 59.7 kg     Examination:  General - alert Eyes - pupils reactive ENT - trach site with tan to brown secretions Cardiac - regular rate/rhythm, no murmur Chest - equal breath sounds b/l, no wheezing or rales Abdomen - soft, non tender, + bowel sounds Extremities - no cyanosis, clubbing, or edema Skin - no rashes Neuro - follows commands, responds appropriately   Resolved Hospital Problem list     Assessment & Plan:   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 Acute encephalopathy due subarachnoid hemorrhage. - EVD, post op care per neurosurgery - continue nimodipine - RASS goal 0  Fever likely from tracheobronchitis with Staph aureus and GNR in sputum culture. - day 2 of zosyn - f/u sputum culture from 11/12  Acute respiratory failure due to subarachnoid hemorrhage. Failure to wean s/p tracheostomy. - pressure support wean to trach collar as tolerated - trach care per ENT - f/u CXR intermittently - goal SpO2 > 92%  Thyroid mass with tracheal compression. - s/p total thyroidectomy 11/12 - post op care per ENT - continue synthroid - monitor calcium  Hypertension. - goal SBP < 140 - continue losartan, amlodipine, labetalol  Hypernatremia. - continue free water - f/u BMET  Hypokalemia. - replace as needed  Newly diagnosed DM type 2 hyperglycemia. - resistant SSI - increase levemir to 10 units daily  Anemia of critical illness. - f/u CBC - transfuse for Hb < 7 or significant bleeding  Urine retention. - continue bethanechol  Best practice:  Diet: tube feeding DVT prophylaxis: sub q heparin/SCD GI prophylaxis: Pantoprazole  Mobility: bed rest Code Status: full Disposition: ICU  Labs    CMP Latest Ref Rng & Units 02/10/2020 02/09/2020 02/09/2020  Glucose 70 - 99 mg/dL 217(H) - -  BUN 8 - 23 mg/dL 23 - -  Creatinine 0.44 - 1.00 mg/dL 0.85 - -  Sodium 135 - 145 mmol/L 154(H) - -   Potassium 3.5 - 5.1 mmol/L 3.5 - -  Chloride 98 - 111 mmol/L 123(H) - -  CO2 22 - 32 mmol/L 23 - -  Calcium 8.9 - 10.3 mg/dL 8.3(L) 8.6(L) 8.4(L)  Total Protein 6.5 - 8.1 g/dL - - -  Total Bilirubin 0.3 - 1.2 mg/dL - - -  Alkaline Phos 38 - 126 U/L - - -  AST 15 - 41 U/L - - -  ALT 0 - 44 U/L - - -    CBC Latest Ref Rng & Units 02/10/2020 02/09/2020 02/08/2020  WBC 4.0 - 10.5 K/uL 14.8(H) 19.6(H) 16.8(H)  Hemoglobin 12.0 - 15.0 g/dL 8.3(L) 9.3(L) 9.8(L)  Hematocrit 36 - 46 % 27.3(L) 30.4(L) 33.1(L)  Platelets 150 - 400 K/uL 151 164 PLATELET CLUMPS NOTED ON SMEAR, COUNT APPEARS ADEQUATE    ABG    Component Value Date/Time   PHART 7.444 02/06/2020 0843   PCO2ART 42.8 02/06/2020 0843   PO2ART 157 (H) 02/06/2020 0843   HCO3 29.2 (H) 02/06/2020 0843   TCO2 30 02/06/2020 0843   ACIDBASEDEF 4.0 (H) 01/31/2020 0113   O2SAT 99.0 02/06/2020 0843    CBG (last 3)  Recent Labs    02/09/20 2320 02/10/20 0312 02/10/20 0714  GLUCAP 215* 175* 154*    Signature:   Chesley Mires, MD [place] Pager - [phone] 02/10/2020, 8:22 AM

## 2020-02-10 NOTE — Progress Notes (Signed)
ENT PROGRESS NOTE   Subjective: Patient seen and examined at bedside. No issues with trach overnight. Per nursing, one of the penrose drains came out yesterday. Continues to do well on pressure support. Denies perioral numbness or tingling. Reports pain controlled.   Objective: Vital signs in last 24 hours: Temp:  [99.3 F (37.4 C)-101.8 F (38.8 C)] 100.1 F (37.8 C) (11/14 0800) Pulse Rate:  [74-100] 80 (11/14 1200) Resp:  [14-28] 22 (11/14 1200) BP: (108-172)/(55-98) 130/68 (11/14 1200) SpO2:  [96 %-100 %] 98 % (11/14 1200) FiO2 (%):  [21 %] 21 % (11/14 1131)  Physical Exam:  General appearance: Sedated but awake for my exam, able to answer questions with head movements, hand gestures Head: Incision C/D/I Neck: Shiley 8-0 trach in place, patent, secured with sutures and trach ties. Neck with mild edema, no hematoma or seroma appreciated. Penrose drain exiting neck on right with serosanguinous drainage. Resp: On vent Neurologic: Motor: Nodding, gesturing with right hand   Recent Labs    02/09/20 0601 02/09/20 1245 02/09/20 1911 02/10/20 0225  NA 155*  --   --  154*  K 3.0*  --   --  3.5  CL 120*  --   --  123*  CO2 23  --   --  23  GLUCOSE 271*  --   --  217*  BUN 29*  --   --  23  CREATININE 0.89  --   --  0.85  CALCIUM 8.6*   < > 8.6* 8.3*   < > = values in this interval not displayed.    Medications: I have reviewed the patient's current medications.  New Imaging: CXR 11/14 IMPRESSION: Tube positions as described without pneumothorax. No edema or airspace opacity. Heart upper normal in size.  Pathology: Pending  Assessment/Plan: Stacy Moran is a 64 y/o F with large subarachnoid hemorrhage, multinodular goiter and respiratory failure now POD #2 s/p total thyroidectomy and tracheostomy   -Continue routine tracheostomy care -Wean vent support as per critical care, OK to deflate cuff when no longer requiring vent support -Dressing changes PRN -Remaining  penrose drain removed at bedside today, patient tolerated well -Do not cut trach sutures until postoperative day #5 -Continue synthroid -Monitor calcium, consider drawing albumin with calcium labs -Medical management as per primary team    LOS: 11 days   Thank you for allowing me to participate in the care of this patient. Please do not hesitate to contact me with any questions or concerns.   Jason Coop, [facility] Cell: [phone]  02/10/2020, 12:10 PM

## 2020-02-11 ENCOUNTER — Other Ambulatory Visit (HOSPITAL_COMMUNITY): Payer: Self-pay

## 2020-02-11 ENCOUNTER — Encounter (HOSPITAL_COMMUNITY): Payer: Self-pay | Admitting: Otolaryngology

## 2020-02-11 ENCOUNTER — Inpatient Hospital Stay (HOSPITAL_COMMUNITY): Payer: Self-pay

## 2020-02-11 LAB — GLUCOSE, CAPILLARY
Glucose-Capillary: 102 mg/dL — ABNORMAL HIGH (ref 70–99)
Glucose-Capillary: 135 mg/dL — ABNORMAL HIGH (ref 70–99)
Glucose-Capillary: 139 mg/dL — ABNORMAL HIGH (ref 70–99)
Glucose-Capillary: 156 mg/dL — ABNORMAL HIGH (ref 70–99)
Glucose-Capillary: 210 mg/dL — ABNORMAL HIGH (ref 70–99)
Glucose-Capillary: 99 mg/dL (ref 70–99)

## 2020-02-11 LAB — BASIC METABOLIC PANEL
Anion gap: 10 (ref 5–15)
BUN: 20 mg/dL (ref 8–23)
CO2: 23 mmol/L (ref 22–32)
Calcium: 8.7 mg/dL — ABNORMAL LOW (ref 8.9–10.3)
Chloride: 120 mmol/L — ABNORMAL HIGH (ref 98–111)
Creatinine, Ser: 0.77 mg/dL (ref 0.44–1.00)
GFR, Estimated: >60 mL/min (ref >60)
Glucose, Bld: 200 mg/dL — ABNORMAL HIGH (ref 70–99)
Potassium: 3.5 mmol/L (ref 3.5–5.1)
Sodium: 153 mmol/L — ABNORMAL HIGH (ref 135–145)

## 2020-02-11 IMAGING — DX DG ABD PORTABLE 1V
1 series · 3 of 3 positions shown · non-contrast
Comparison: [DATE].

CLINICAL DATA: 64-year-old female with abdominal distension.
Ruptured intracranial aneurysm.

EXAM:
PORTABLE ABDOMEN - 1 VIEW

[Series 1: abdomen · 0.14mm/px · 3 of 3 slices shown]
[im 1/3]
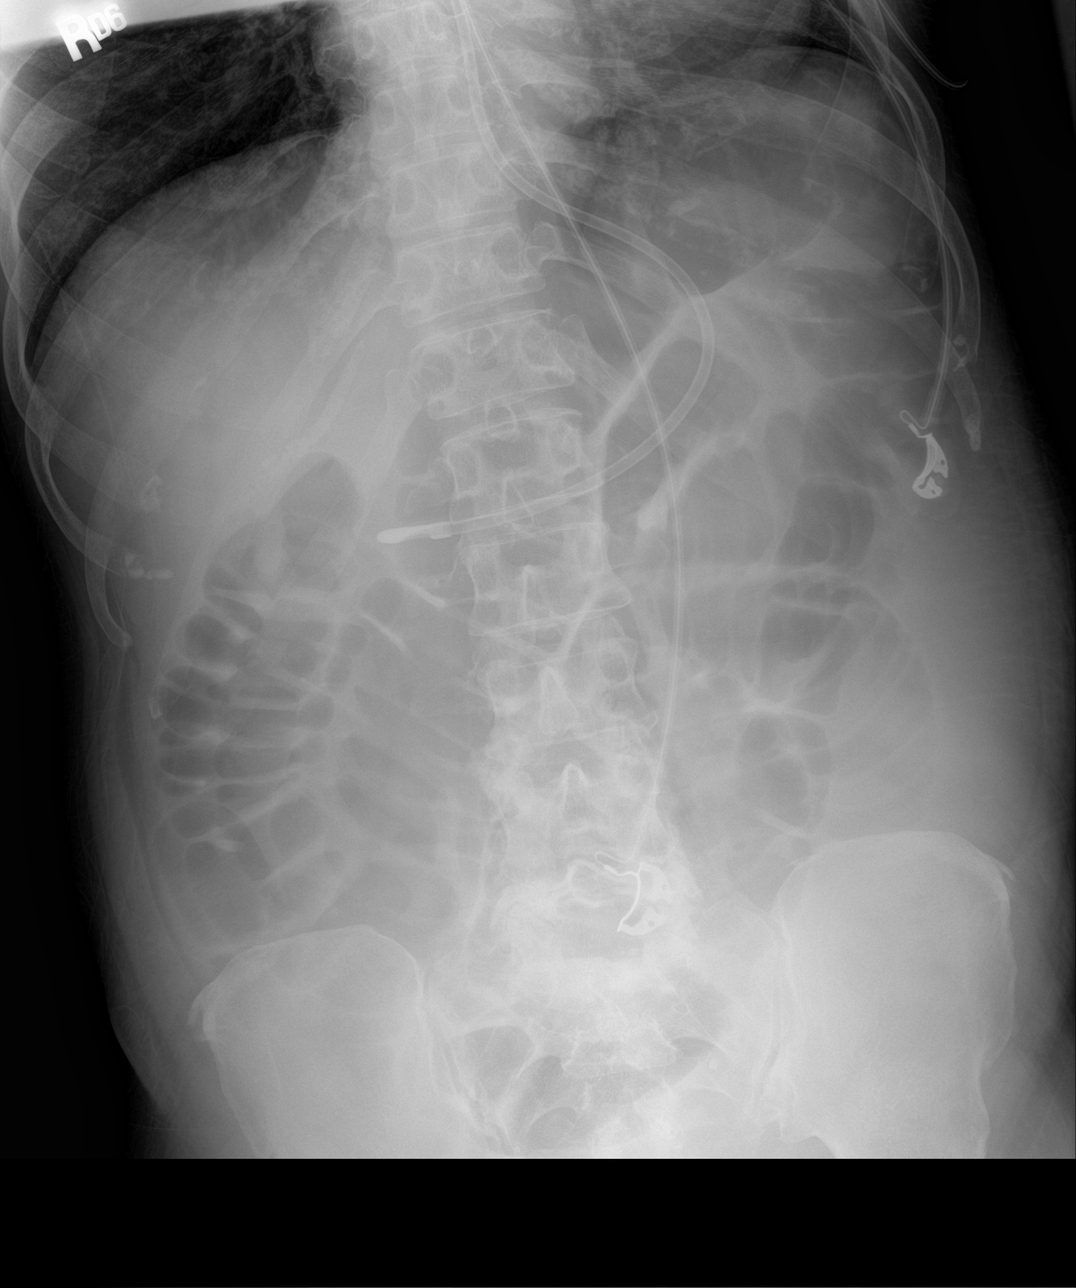
[im 2/3]
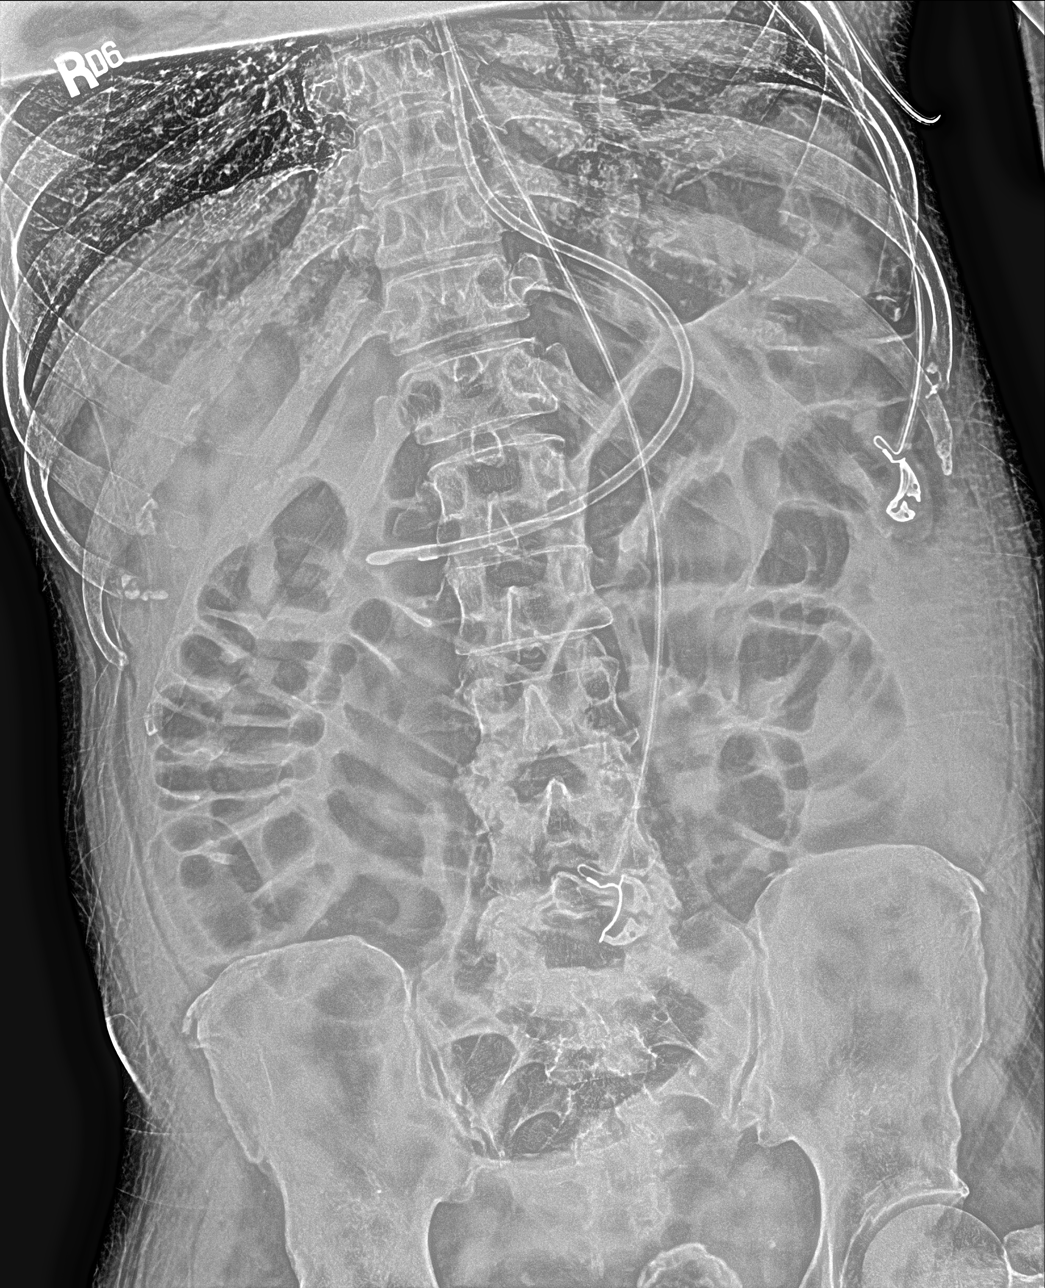
[im 3/3]
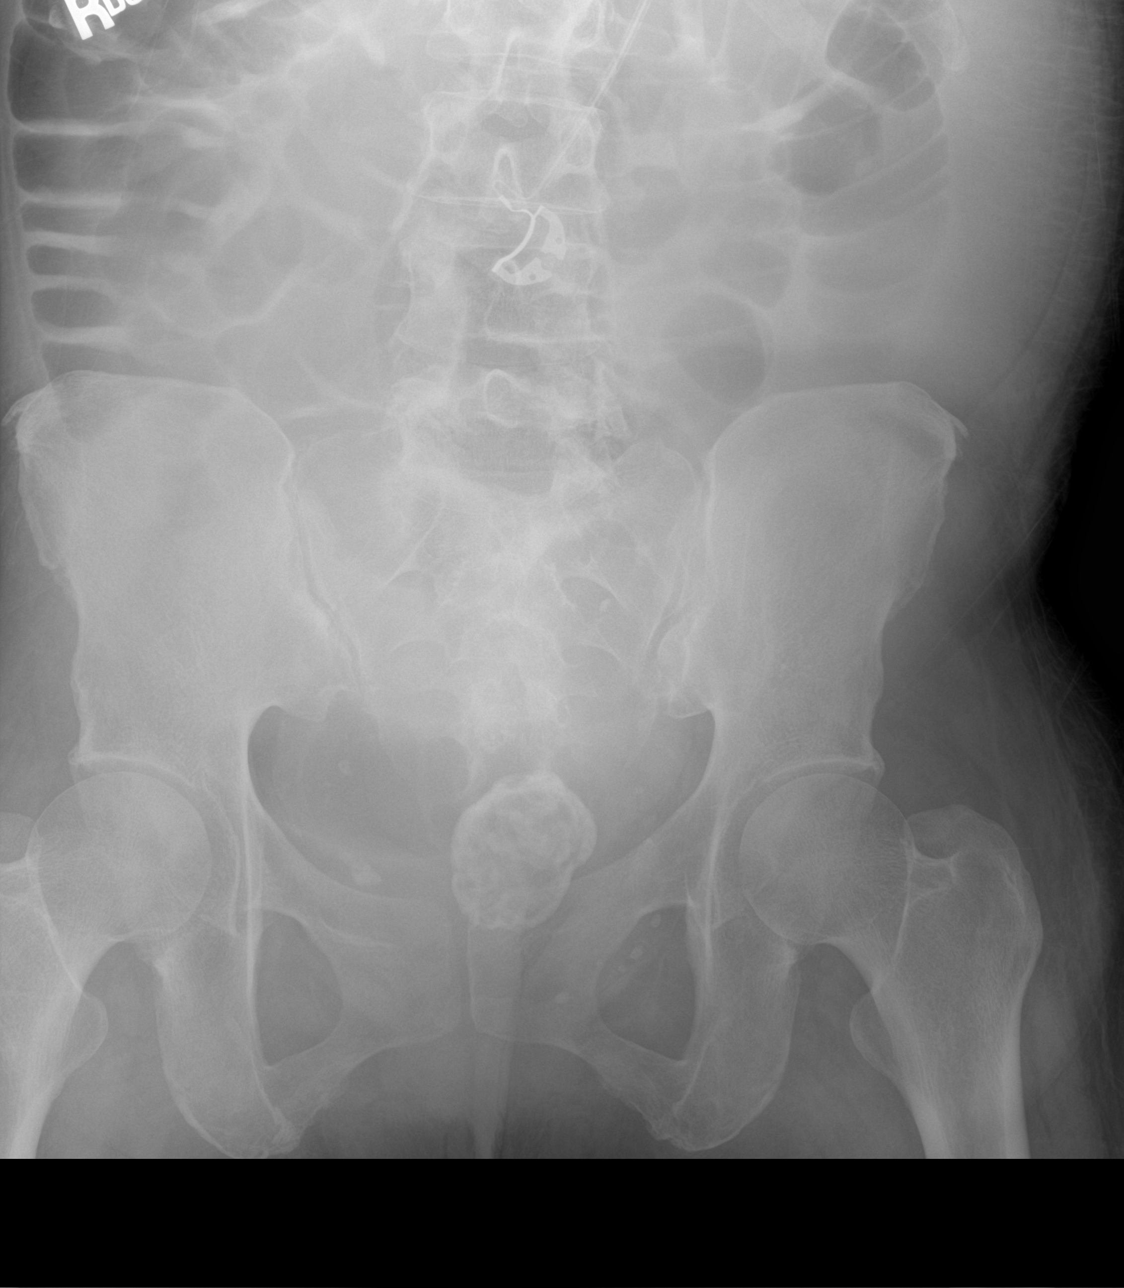

[3 of 3 positions shown; findings below may reference images not displayed]

FINDINGS: Portable AP supine views at [E4] hours. NG tube has been removed.
Enteric feeding tube now in place with tip at the level of the
gastric antrum.

Widespread small bowel gas, gas distended small bowel loops up to 4
cm diameter in the left abdomen. But there is also gas in the
ascending colon and at both flexures. Calcified 5 cm uterine fibroid
in the pelvis suspected. Negative lung bases. No pneumoperitoneum is
evident on these supine views. No acute osseous abnormality
identified.
IMPRESSION: 1. NG type tube removed and enteric feeding tube placed with tip at
the level of the gastric antrum.

2. Gas distended small bowel and proximal colon. Differential
considerations include ileus and a mechanical obstruction of the
distal large bowel.

3. Calcified uterine fibroid.

## 2020-02-11 MED ORDER — INSULIN DETEMIR 100 UNIT/ML ~~LOC~~ SOLN
15.0000 [IU] | Freq: Every day | SUBCUTANEOUS | Status: DC
  Administered 2020-02-11 – 2020-02-14 (×3): 15 [IU] via SUBCUTANEOUS
  Filled 2020-02-11 (×5): qty 0.15

## 2020-02-11 MED ORDER — BISACODYL 10 MG RE SUPP
10.0000 mg | Freq: Every day | RECTAL | Status: DC | PRN
  Administered 2020-02-11 – 2020-03-04 (×3): 10 mg via RECTAL
  Filled 2020-02-11 (×3): qty 1

## 2020-02-11 MED ORDER — LABETALOL HCL 200 MG PO TABS
300.0000 mg | ORAL_TABLET | Freq: Two times a day (BID) | ORAL | Status: DC
  Administered 2020-02-11 – 2020-02-15 (×10): 300 mg
  Filled 2020-02-11: qty 3
  Filled 2020-02-11 (×3): qty 1
  Filled 2020-02-11: qty 3
  Filled 2020-02-11: qty 1
  Filled 2020-02-11: qty 3
  Filled 2020-02-11: qty 1
  Filled 2020-02-11: qty 3
  Filled 2020-02-11: qty 1
  Filled 2020-02-11: qty 3

## 2020-02-11 NOTE — Progress Notes (Signed)
Physical Therapy Treatment Patient Details Name: Stacy Moran MRN: [id number] DOB: 04-Aug-1955 Today's Date: 02/11/2020    History of Present Illness pt is a 64 y/o female with no known PHX admitted with sudden onset of HA, BP 262/123, Imaging showing large volume SAHdue to a ruptured L PICA aneurysm associated with intraventricular hydrocephalus.  11/4, pt s/p L far-lateral craniotomy for clipping of PICA aneurysm. 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    PT Comments    Pt very interactive with PT and OT this day, laughing/smiling appropriately and following one-step commands throughout session. Pt required overall max +2 assist for bed mobility tasks, tolerated EOB sitting x~5 minutes limited by DOE 2/4 with max RR 31 breaths/min and serosanguinous leaking from trach site. RN assessed trach site, states was okay and just needed a new dressing. PT to continue to progress pt mobility as able.     Follow Up Recommendations  SNF;Other (comment) (working toward being approp for [employer])     Equipment Recommendations  Other (comment) (TBA)    Recommendations for Other Services       Precautions / Restrictions Precautions Precautions: Fall Precaution Comments: EVD clamped, trach, bilateral wrist restraints Restrictions Weight Bearing Restrictions: No    Mobility  Bed Mobility Overal bed mobility: Needs Assistance Bed Mobility: Rolling;Sidelying to Sit;Sit to Supine Rolling: Min assist Sidelying to sit: Max assist;+2 for physical assistance   Sit to supine: Max assist;+2 for physical assistance;+2 for safety/equipment   General bed mobility comments: mod assist rolling bilaterally for truncal translation, max assist +2 for supine<>sit for trunk and LE management, scooting to and from EOB.  Transfers                 General transfer comment: not attempted, serosanguinous drainage from trach  site.  Ambulation/Gait                 Stairs             Wheelchair Mobility    Modified Rankin (Stroke Patients Only) Modified Rankin (Stroke Patients Only) Pre-Morbid Rankin Score: No symptoms Modified Rankin: Severe disability     Balance Overall balance assessment: Needs assistance Sitting-balance support: No upper extremity supported;Feet supported Sitting balance-Leahy Scale: Poor Sitting balance - Comments: requires min-mod posterior assist to maintain upright sitting Postural control: Posterior lean                                  Cognition Arousal/Alertness: Awake/alert Behavior During Therapy: Impulsive Overall Cognitive Status: Impaired/Different from baseline Area of Impairment: Orientation;Attention;Memory;Following commands;Safety/judgement;Problem solving                 Orientation Level: Disoriented to;Time;Situation Current Attention Level: Sustained Memory: Decreased short-term memory Following Commands: Follows one step commands consistently Safety/Judgement: Decreased awareness of deficits;Decreased awareness of safety   Problem Solving: Requires verbal cues;Requires tactile cues;Difficulty sequencing General Comments: pt correctly IDs year as 2021, states "yes" to the month being may as opposed to November. Impulsive and reaching for lines/leads throughout session, stops when cued to.      Exercises      General Comments        Pertinent Vitals/Pain Pain Assessment: Faces Faces Pain Scale: Hurts little more Pain Location: abdomen Pain Descriptors / Indicators: Discomfort;Grimacing Pain Intervention(s): Limited activity within patient's tolerance;Monitored during session;Repositioned    Home Living  Prior Function            PT Goals (current goals can now be found in the care plan section) Acute Rehab PT Goals PT Goal Formulation: Patient unable to participate in goal  setting Time For Goal Achievement: 02/16/20 Potential to Achieve Goals: Fair Progress towards PT goals: Progressing toward goals    Frequency    Min 3X/week      PT Plan Current plan remains appropriate    Co-evaluation PT/OT/SLP Co-Evaluation/Treatment: Yes Reason for Co-Treatment: Complexity of the patient's impairments (multi-system involvement);For patient/therapist safety;To address functional/ADL transfers PT goals addressed during session: Mobility/safety with mobility;Balance        AM-PAC PT "6 Clicks" Mobility   Outcome Measure  Help needed turning from your back to your side while in a flat bed without using bedrails?: A Lot Help needed moving from lying on your back to sitting on the side of a flat bed without using bedrails?: Total Help needed moving to and from a bed to a chair (including a wheelchair)?: Total Help needed standing up from a chair using your arms (e.g., wheelchair or bedside chair)?: Total Help needed to walk in hospital room?: Total Help needed climbing 3-5 steps with a railing? : Total 6 Click Score: 7    End of Session Equipment Utilized During Treatment: Oxygen (via trach) Activity Tolerance: Patient tolerated treatment well Patient left: in bed;with bed alarm set;with call bell/phone within reach Nurse Communication: Mobility status PT Visit Diagnosis: Hemiplegia and hemiparesis;Other symptoms and signs involving the nervous system (R29.898);Other abnormalities of gait and mobility (R26.89) Hemiplegia - Right/Left: Left Hemiplegia - dominant/non-dominant: Non-dominant Hemiplegia - caused by: Other Nontraumatic intracranial hemorrhage     Time: 8891-6945 PT Time Calculation (min) (ACUTE ONLY): 23 min  Charges:  $Therapeutic Activity: 8-22 mins                     [name], PT Acute Rehabilitation Services Pager [phone]  Office [phone]     [name] D [name] 02/11/2020, 3:58 PM

## 2020-02-11 NOTE — Progress Notes (Signed)
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    Past Medical History  No known [facility] Events   11/03 Admitted 11/04 craniotomy with clipping of left PICA 11/12 total thyroidectomy, tracheostomy 11/13 start ABx for fever and tracheobronchitis  Consults:  ENT Constance Holster)  Procedures:  11/03 ETT >>11/09 11/03 EVD >> 11/09 ETT >> 11/12 11/12 Tracheostomy >>  Significant Diagnostic Tests:  11/3 CT head > large volume subarachnoid related to ruptured left PICA aneurysm, intraventricular reflux with hydrocephalus, chronic small vessel disease, chronic small vessel disease 11/9 H&N U/S > 2.3x2.3x2.4 mass in the left neck along the left thyroid lobe  Micro Data:  11/03 SARS2/ Flu > neg 11/08 respiratory culture >> negative 11/08 blood culture >> 11/11 urine culture >> multiple species 11/12 MRSA PCR >> negative 11/12 sputum >> Staph aureus, GNR >>   Antimicrobials:  Zosyn 11/13 >>   Interim history/subjective:  Now 11 days post clipping.  Complains only of abdominal discomfort. Mild headache.  EVD has stopped draining  Objective   Blood pressure (!) 178/88, pulse 89, temperature 99.4 F (37.4 C), temperature source Axillary, resp. rate 18, height 5\' 9"  (1.753 m), weight 59.7 kg, SpO2 100 %.    Vent Mode: PRVC FiO2 (%):  [21 %] 21 % Set Rate:  [16 bmp] 16 bmp Vt Set:  [520 mL] 520 mL PEEP:  [5 cmH20] 5 cmH20 Pressure Support:  [10 cmH20] 10 cmH20 Plateau Pressure:  [18 cmH20-22 cmH20] 21 cmH20   Intake/Output Summary (Last 24 hours) at 02/11/2020 0755 Last data filed at 02/11/2020 0700 Gross per 24 hour  Intake 5300.31 ml  Output 1630 ml  Net 3670.31 ml   Filed Weights   02/06/20 0500 02/07/20 0326  02/08/20 0500  Weight: 59.8 kg 60.6 kg 59.7 kg    Examination:  General - alert, and follows commands appropriately Eyes - pupils reactive ENT - trach site intact with minimal secretions.  Cardiac - regular rate/rhythm, no murmur Chest - equal breath sounds b/l, no wheezing or rales Abdomen - distended but tender. Extremities - no edema.  Skin -  line sites intact.  Neuro - follows commands, responds appropriately, 4/5 strength in all limbs.    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 Acute encephalopathy due subarachnoid hemorrhage. - Clamp EVD and repeat CT in am.  - Stop TCD's as unchanged with poor acoustic windows.  - continue nimodipine - RASS goal 0  Fever likely from tracheobronchitis with Staph aureus and GNR in sputum culture. - day 2 of ceftriaxone - f/u sputum culture from 11/12  Acute respiratory failure due to subarachnoid hemorrhage. Failure to wean s/p tracheostomy. - pressure support wean to trach collar as tolerated - trach care per ENT - f/u CXR intermittently - goal SpO2 > 92%  Thyroid mass with tracheal compression. - s/p total thyroidectomy 11/12 - post op care per ENT - continue synthroid - monitor calcium  Hypertension. - goal SBP < 140 - continue losartan, amlodipine, labetalol  Hypernatremia. Improving slowly - continue free water - f/u BMET  Anemia of critical illness. - f/u CBC - transfuse for Hb <  7 or significant bleeding   Daily Goals Checklist  Pain/Anxiety/Delirium protocol (if indicated): dexmedetomidine Neuro vitals: every 4 hours AED's: none VAP protocol (if indicated): bundle in place. Respiratory support goals: trach collar today.  Blood pressure target: SBP<200 until out of vasospasm window, then <140 DVT prophylaxis: heparin tid Nutrition Status: moderate nutritional risk continue tube  feeds.  GI prophylaxis: Protonix. Dulcolax for constipation. Fluid status goals: positive fluid balance.  Urinary catheter: on benechol for retention.  Trial of catheter removal.  Central lines: midline catheter. Glucose control: stress hyperglycemia - still suboptimal control- will increase Lantus Mobility/therapy needs: PT, OT - progressive ambulation Antibiotic de-escalation: Complete course of ceftriaxone. Home medication reconciliation: on hold.  Daily labs: BMP daily. CBC twice weekly  Code Status: full code.  Family Communication: will update daughter Disposition: ICU   Labs    CMP Latest Ref Rng & Units 02/11/2020 02/10/2020 02/10/2020  Glucose 70 - 99 mg/dL 200(H) - -  BUN 8 - 23 mg/dL 20 - -  Creatinine 0.44 - 1.00 mg/dL 0.77 - -  Sodium 135 - 145 mmol/L 153(H) - -  Potassium 3.5 - 5.1 mmol/L 3.5 - -  Chloride 98 - 111 mmol/L 120(H) - -  CO2 22 - 32 mmol/L 23 - -  Calcium 8.9 - 10.3 mg/dL 8.7(L) 8.7(L) 8.5(L)  Total Protein 6.5 - 8.1 g/dL - - -  Total Bilirubin 0.3 - 1.2 mg/dL - - -  Alkaline Phos 38 - 126 U/L - - -  AST 15 - 41 U/L - - -  ALT 0 - 44 U/L - - -    CBC Latest Ref Rng & Units 02/10/2020 02/09/2020 02/08/2020  WBC 4.0 - 10.5 K/uL 14.8(H) 19.6(H) 16.8(H)  Hemoglobin 12.0 - 15.0 g/dL 8.3(L) 9.3(L) 9.8(L)  Hematocrit 36 - 46 % 27.3(L) 30.4(L) 33.1(L)  Platelets 150 - 400 K/uL 151 164 PLATELET CLUMPS NOTED ON SMEAR, COUNT APPEARS ADEQUATE    ABG    Component Value Date/Time   PHART 7.444 02/06/2020 0843   PCO2ART 42.8 02/06/2020 0843   PO2ART 157 (H) 02/06/2020 0843   HCO3 29.2 (H) 02/06/2020 0843   TCO2 30 02/06/2020 0843   ACIDBASEDEF 4.0 (H) 01/31/2020 0113   O2SAT 99.0 02/06/2020 0843    CBG (last 3)  Recent Labs    02/10/20 2003 02/10/20 2338 02/11/20 0359  GLUCAP 154* 151* 210*   CRITICAL CARE Performed by: Kipp Brood   Total critical care time: 40 minutes  Critical care time was exclusive of separately billable procedures  and treating other patients.  Critical care was necessary to treat or prevent imminent or life-threatening deterioration.  Critical care was time spent personally by me on the following activities: development of treatment plan with patient and/or surrogate as well as nursing, discussions with consultants, evaluation of patient's response to treatment, examination of patient, obtaining history from patient or surrogate, ordering and performing treatments and interventions, ordering and review of laboratory studies, ordering and review of radiographic studies, pulse oximetry, re-evaluation of patient's condition and participation in multidisciplinary rounds.  Kipp Brood, MD [facility] ICU Physician [place]  Pager: [phone] Mobile: [phone] After hours: [phone].

## 2020-02-11 NOTE — Progress Notes (Signed)
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  11/4, pt s/p L far-lateral craniotomy for clipping of PICA aneurysm. 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   OT comments  Pt seen for OT re-evaluation today. Pt awake/alert and engaging with therapies upon arrival despite having trach. Pt moving bil UE today and following majority of simple commands, though remains impulsive and requires safety cues throughout. Pt continues to require two person assist for completion of bed mobility and at least maxA for static balance EOB. Further activity deferred as noted drainage/leaking from trach site upon sitting up - returned to supine and RN made aware. Pt to benefit from continued acute OT services and continue to recommend post acute rehab services to progress pt towards her PLOF.   Follow Up Recommendations  SNF (working towards appropriate for [employer])    Financial risk analyst (measurements OT);Wheelchair cushion (measurements OT);Hospital bed          Precautions / Restrictions Precautions Precautions: Fall Precaution Comments: EVD clamped, trach, bilateral wrist restraints Restrictions Weight Bearing Restrictions: No       Mobility Bed Mobility Overal bed mobility: Needs Assistance Bed Mobility: Rolling;Sidelying to Sit;Sit to Supine Rolling: Min assist Sidelying to sit: Max assist;+2 for physical assistance   Sit to supine: Max assist;+2 for physical assistance;+2 for safety/equipment   General bed mobility comments: mod assist rolling bilaterally for truncal translation, max assist +2 for supine<>sit  for trunk and LE management, scooting to and from EOB.  Transfers                 General transfer comment: not attempted, serosanguinous drainage from trach site.    Balance Overall balance assessment: Needs assistance Sitting-balance support: No upper extremity supported;Feet supported Sitting balance-Leahy Scale: Poor Sitting balance - Comments: requires min-mod posterior assist to maintain upright sitting Postural control: Posterior lean                                 ADL either performed or assessed with clinical judgement   ADL Overall ADL's : Needs assistance/impaired                                       General ADL Comments: max-totalA currently                       Cognition Arousal/Alertness: Awake/alert Behavior During Therapy: Impulsive Overall Cognitive Status: Impaired/Different from baseline Area of Impairment: Orientation;Attention;Memory;Following commands;Safety/judgement;Problem solving                 Orientation Level: Disoriented to;Time;Situation Current Attention Level: Sustained Memory: Decreased short-term memory Following Commands: Follows one step commands consistently Safety/Judgement: Decreased awareness of deficits;Decreased awareness of safety   Problem Solving: Requires verbal cues;Requires tactile cues;Difficulty sequencing General Comments: pt correctly IDs year as 2021, states "yes" to the month being may as opposed to November. Impulsive and reaching for lines/leads throughout session, stops when cued to.        Exercises  Shoulder Instructions       General Comments      Pertinent Vitals/ Pain       Pain Assessment: Faces Faces Pain Scale: Hurts little more Pain Location: abdomen Pain Descriptors / Indicators: Discomfort;Grimacing Pain Intervention(s): Monitored during session;Limited activity within patient's tolerance;Repositioned  Home Living                                           Prior Functioning/Environment              Frequency  Min 2X/week        Progress Toward Goals  OT Goals(current goals can now be found in the care plan section)  Progress towards OT goals: Progressing toward goals  Acute Rehab OT Goals Patient Stated Goal: none stated agreeable to working with therapies OT Goal Formulation: With patient Time For Goal Achievement: 02/25/20 Potential to Achieve Goals: Good  Plan Discharge plan remains appropriate    Co-evaluation    PT/OT/SLP Co-Evaluation/Treatment: Yes Reason for Co-Treatment: Complexity of the patient's impairments (multi-system involvement);Necessary to address cognition/behavior during functional activity;For patient/therapist safety;To address functional/ADL transfers PT goals addressed during session: Mobility/safety with mobility;Balance OT goals addressed during session: ADL's and self-care      AM-PAC OT "6 Clicks" Daily Activity     Outcome Measure   Help from another person eating meals?: Total Help from another person taking care of personal grooming?: A Lot Help from another person toileting, which includes using toliet, bedpan, or urinal?: Total Help from another person bathing (including washing, rinsing, drying)?: Total Help from another person to put on and taking off regular upper body clothing?: Total Help from another person to put on and taking off regular lower body clothing?: Total 6 Click Score: 7    End of Session Equipment Utilized During Treatment: Oxygen  OT Visit Diagnosis: Unsteadiness on feet (R26.81);Muscle weakness (generalized) (M62.81);Other abnormalities of gait and mobility (R26.89)   Activity Tolerance Patient tolerated treatment well   Patient Left in bed;with call bell/phone within reach;with restraints reapplied   Nurse Communication Mobility status        Time: 1740-8144 OT Time Calculation (min): 23 min  Charges: OT  General Charges $OT Visit: 1 Visit OT Evaluation $OT Re-eval: 1 Re-eval  Lou Cal, OT Acute Rehabilitation Services Pager [phone] Office [place] 02/11/2020, 5:45 PM

## 2020-02-11 NOTE — Progress Notes (Signed)
She is awake and alert. Off the ventilator this morning. Trach collar in place. Tracheostomy is well seated. Neck is soft without any signs of infection or hematoma.  May deflate trach cuff when appropriate. May change the tracheostomy to uncuffed on Wednesday if desired. Call if any additional problems arise. May stop checking calciums.

## 2020-02-11 NOTE — Progress Notes (Addendum)
  NEUROSURGERY PROGRESS NOTE   EVD stopped draining last pm. Pt without complaint this am, wants to eat.  EXAM:  BP (!) 154/123   Pulse (!) 105   Temp 99.7 F (37.6 C) (Oral)   Resp (!) 21   Ht 5\' 9"  (1.753 m)   Wt 59.7 kg   SpO2 100%   BMI 19.44 kg/m   Awake, alert Answers questions appropriately, Mouths responses On trach collar CN grossly intact, ?left tongue deviation 5/5 BUE/BLE  Wound c/d/i EVD clamped  IMPRESSION:  64 y.o. female POD# 12 SAHd# 12 s/p clipping Left PICA, neurologically stable. POD# 3 s/p thyroidectomy/trach doing well  PLAN: - Keep drain clamped, monitor neurologic exam and CT head tomorrow am - Cont Nimotop, can d/c TCD - poor transcranial windows and velocities have been normal with monitorable neurologic exam - PT/OT/SLP eval - Downsize trach on Wed if tolerating trach collar.   [name], MD [facility]

## 2020-02-11 NOTE — Progress Notes (Signed)
Notified Dr. Marcello Moores of EVD not draining. No new orders at this time.

## 2020-02-12 ENCOUNTER — Inpatient Hospital Stay (HOSPITAL_COMMUNITY): Payer: Self-pay

## 2020-02-12 LAB — SURGICAL PATHOLOGY

## 2020-02-12 LAB — GLUCOSE, CAPILLARY
Glucose-Capillary: 121 mg/dL — ABNORMAL HIGH (ref 70–99)
Glucose-Capillary: 91 mg/dL (ref 70–99)
Glucose-Capillary: 72 mg/dL (ref 70–99)
Glucose-Capillary: 94 mg/dL (ref 70–99)
Glucose-Capillary: 124 mg/dL — ABNORMAL HIGH (ref 70–99)
Glucose-Capillary: 128 mg/dL — ABNORMAL HIGH (ref 70–99)

## 2020-02-12 IMAGING — CT CT HEAD W/O CM
2 series · 15 of 30 positions shown, 17 images · non-contrast
Comparison: Head CT from 13 days ago

CLINICAL DATA: Follow-up subarachnoid hemorrhage

EXAM:
CT HEAD WITHOUT CONTRAST
TECHNIQUE: Contiguous axial images were obtained from the base of the skull
through the vertex without intravenous contrast.

[Series 3: head wo · axial · 0.44mm/px · z∈[-128,-8]mm · 7 of 34 slices shown, 9 images]
[im 5/34  brain]
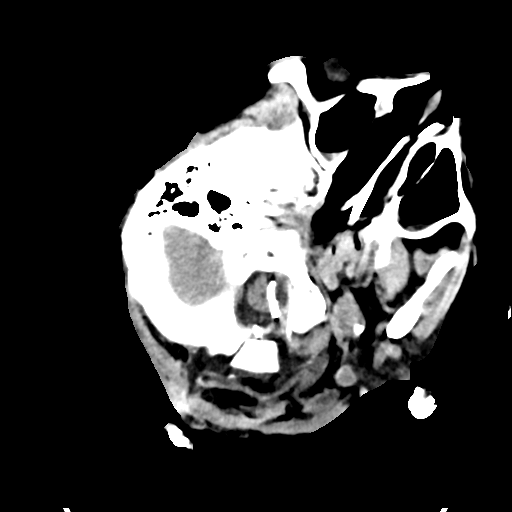
[im 5/34  bone]
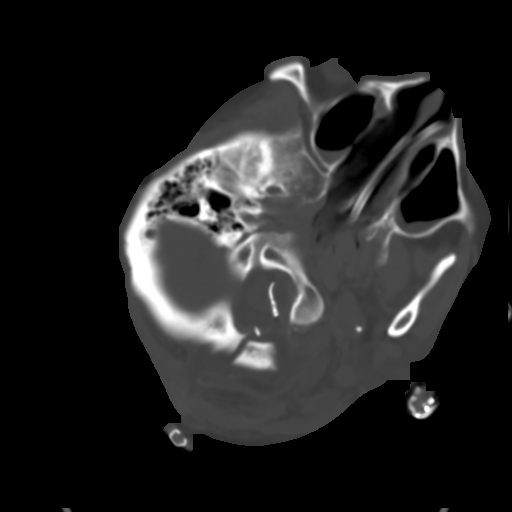
[im 9/34  brain]
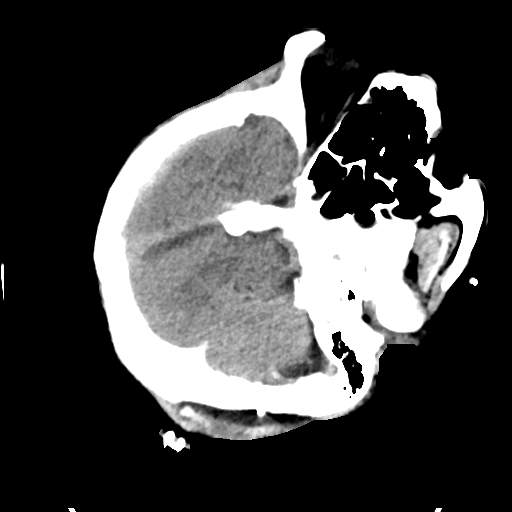
[im 13/34  brain]
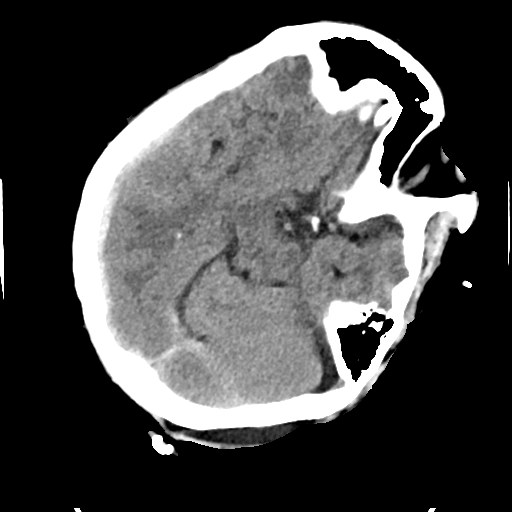
[im 17/34  brain]
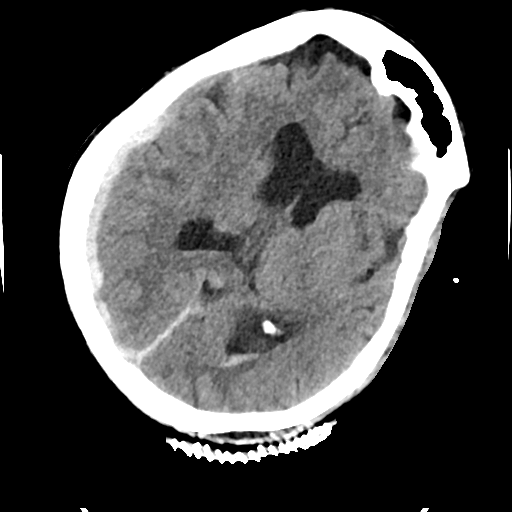
[im 21/34  brain]
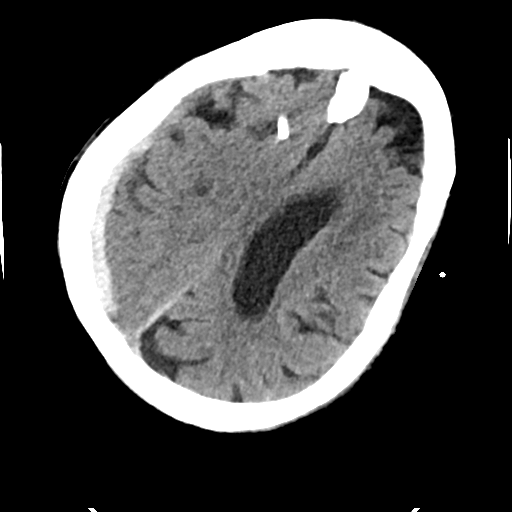
[im 21/34  bone]
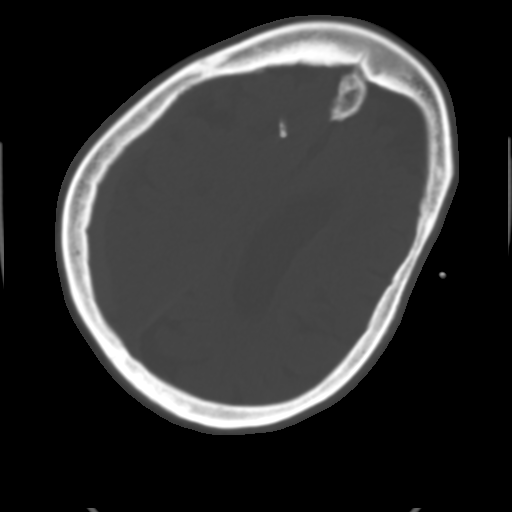
[im 25/34  brain]
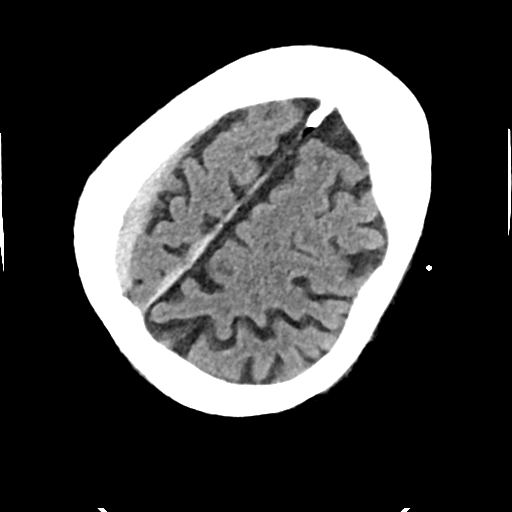
[im 29/34  brain]
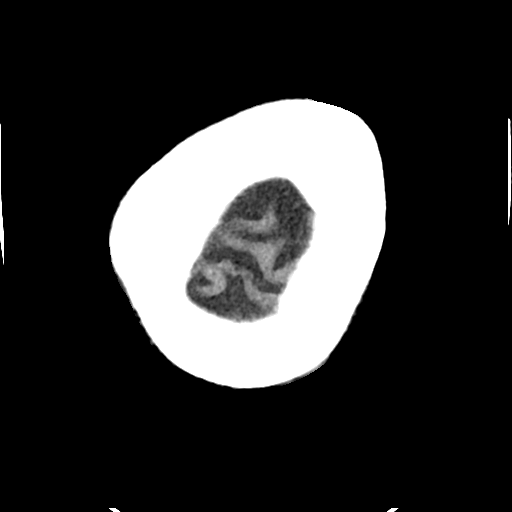

[Series 4: head bone · axial · 0.44mm/px · z∈[-132,+0]mm · 8 of 84 slices shown]
[im 9/84  bone]
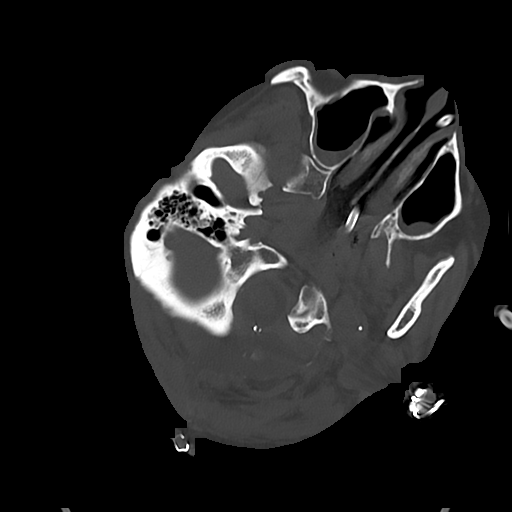
[im 17/84  bone]
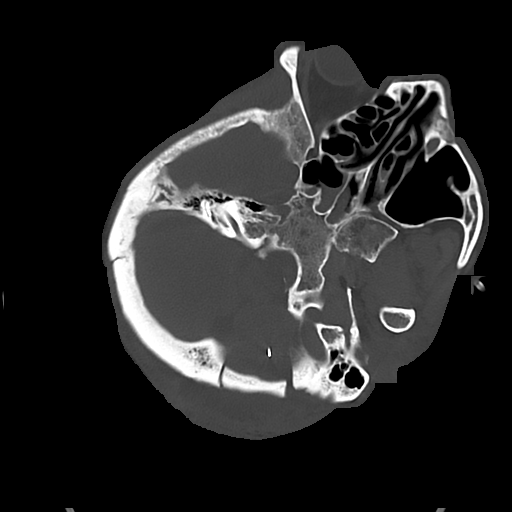
[im 25/84  bone]
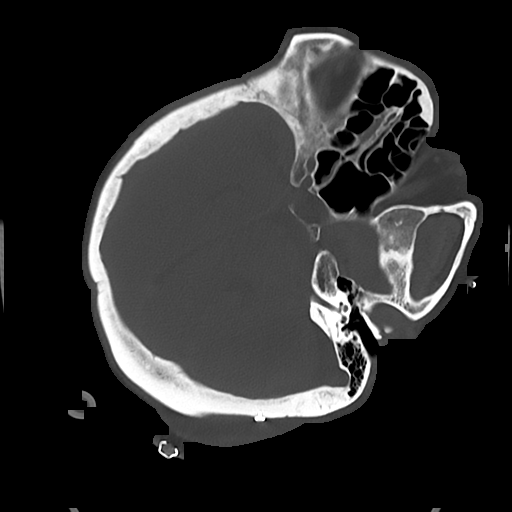
[im 38/84  bone]
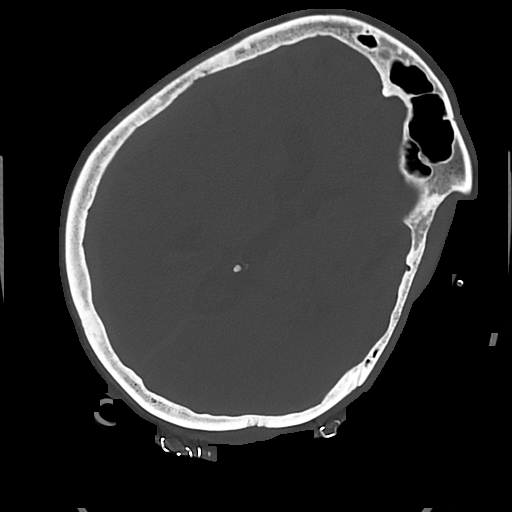
[im 46/84  bone]
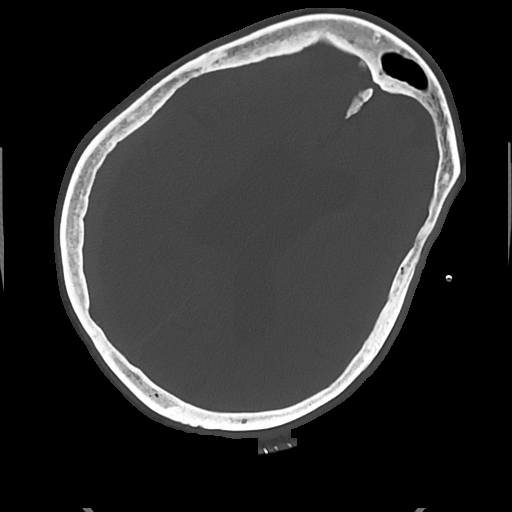
[im 59/84  bone]
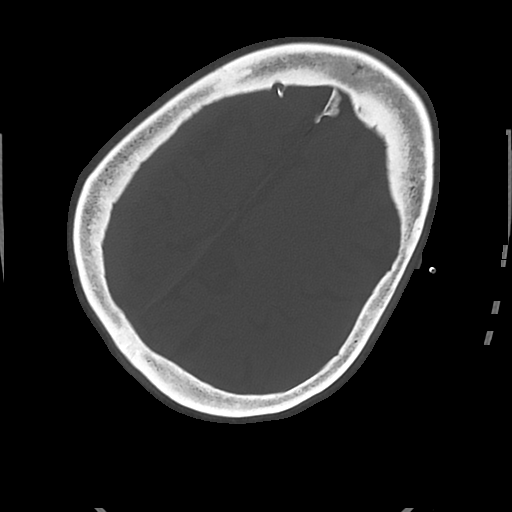
[im 67/84  bone]
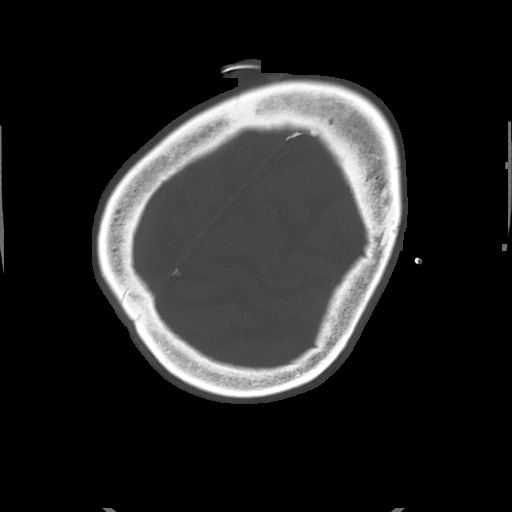
[im 75/84  bone]
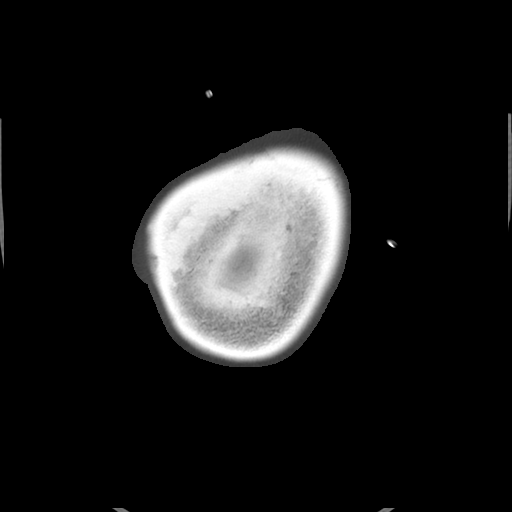

[15 of 30 positions shown; findings below may reference images not displayed]

FINDINGS: Brain: Interval craniotomy for left PICA aneurysm clipping. No
evidence of acute infarct.

Subarachnoid hemorrhage has significantly diminished, now faintly
seen best at the level of the left sylvian fissure. Small volume
intraventricular hemorrhage layering in the lateral ventricles.

A subdural hematoma has developed along the right cerebral
convexity, both laterally and at the level of the falx, measuring up
to 5 mm in thickness without notable mass effect.

Right frontal external ventricular drain with tip not reaching the
lateral ventricles. Ventricular volume has normalized since prior.

No evidence of acute infarct. Chronic perforator infarct at the
right basal ganglia.

Vascular: No unexpected finding

Skull: Left suboccipital craniotomy and C1 posterior ring resection.
There is overlying fluid collection with somewhat tense appearing
margins but no thecal sac compression

Sinuses/Orbits: Left enucleation and prosthesis.
IMPRESSION: 1. Significant decrease in subarachnoid hemorrhage. Small volume
residual is seen along the left sylvian fissure and in the lateral
ventricles.
2. Subdural hematoma along the right cerebral hemisphere measuring
up to 5 mm in thickness and without notable mass effect.
3. Right frontal EVD which does not reach the lateral ventricle.
Ventricular volume has normalized since comparison.
4. Fluid collection superficial to the craniotomy and C1 ring
resection.

## 2020-02-12 MED ORDER — METOCLOPRAMIDE HCL 5 MG/5ML PO SOLN
5.0000 mg | Freq: Four times a day (QID) | ORAL | Status: DC
  Administered 2020-02-12 – 2020-03-05 (×57): 5 mg
  Filled 2020-02-12 (×96): qty 10

## 2020-02-12 NOTE — Progress Notes (Signed)
Nutrition Follow-up  DOCUMENTATION CODES:   Not applicable  INTERVENTION:   Tube feeding via Cortrak tube: Osmolite 1.2 at 60 ml/h (1440 ml per day) Prosource TF 45 ml BID  Provides 1819 kcal, 101 gm protein, 1167 ml free water daily  300 ml free water every 4 hours Total free water: 2967 ml    NUTRITION DIAGNOSIS:   Inadequate oral intake related to acute illness as evidenced by estimated needs.  Ongoing.   GOAL:   Patient will meet greater than or equal to 90% of their needs  Met with TF.   MONITOR:   Vent status, Labs, Weight trends, Skin, I & O's  REASON FOR ASSESSMENT:   Ventilator    ASSESSMENT:   64 year old female who presented to the ED on 11/03 with headache. 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 Pt required intubation for airway protection.  Pt discussed during ICU rounds and with RN. Per report pt more alert working with therapy.    11/03 Admitted 11/04 SAH, L PICA aneurysm to OR for craniotomy, clipping  11/09 extubated but immediately re-intubated 11/12 s/p thyroidectomy with tracheostomy; cortrak placed; tip gastric  11/16 EVD removed   Medications reviewed and include: SSI, levemir, reglan, miralax Precedex  Labs reviewed: Na 153   Diet Order:   Diet Order    None      EDUCATION NEEDS:   No education needs have been identified at this time  Skin:  Skin Assessment: Skin Integrity Issues: Skin Integrity Issues:: Stage II Stage II: throat  Last BM:  11/16  Height:   Ht Readings from Last 1 Encounters:  01/30/20 5' 9"  (1.753 m)    Weight:   Wt Readings from Last 1 Encounters:  02/08/20 59.7 kg    Ideal Body Weight:  65.9 kg  BMI:  Body mass index is 19.44 kg/m.  Estimated Nutritional Needs:   Kcal:  1700-1900  Protein:  95-110 grams  Fluid:  > 1.7 L/day  Lockie Pares., RD, LDN, CNSC See AMiON for contact information

## 2020-02-12 NOTE — Progress Notes (Signed)
NAME:  Stacy Moran, MRN:  [id number], DOB:  1956-03-11, LOS: 74 ADMISSION DATE:  01/30/2020, CONSULTATION DATE:  01/30/20 REFERRING MD:  Ralene Ok, CHIEF COMPLAINT:  Headache   Brief History   64 y/o presented with large subarachnoid hemorrhage due to ruptured PICA with resultant hydrocephalus.    Past Medical History  No known [place] [facility] Events   11/03 Admitted 11/04 craniotomy with clipping of left PICA 11/12 total thyroidectomy, tracheostomy 11/13 start ABx for fever and tracheobronchitis  Consults:  ENT Constance Holster)  Procedures:  11/03 ETT >>11/09 11/03 EVD >> 11/09 ETT >> 11/12 11/12 Tracheostomy >>  Significant Diagnostic Tests:  11/3 CT head > large volume subarachnoid related to ruptured left PICA aneurysm, intraventricular reflux with hydrocephalus, chronic small vessel disease, chronic small vessel disease 11/9 H&N U/S > 2.3x2.3x2.4 mass in the left neck along the left thyroid lobe 11/17 CT head > near   Micro Data:  11/03 SARS2/ Flu > neg 11/08 respiratory culture >> negative 11/08 blood culture >> 11/11 urine culture >> multiple species 11/12 MRSA PCR >> negative 11/12 sputum >> Staph aureus, GNR >>   Antimicrobials:  Zosyn 11/13 >> 11/14 Ceftriaxone 11/14 - 11/20  Interim history/subjective:  Now 11 days post clipping.  Drain stopped yesterday so left clamped.  Was interactive with PT yesterday. This morning, agitated and complaining of back pain. More somnolent following fentanyl this morning.   Objective   Blood pressure (!) 204/97, pulse (!) 107, temperature 98.8 F (37.1 C), temperature source Axillary, resp. rate (!) 29, height 5\' 9"  (1.753 m), weight 59.7 kg, SpO2 97 %.    FiO2 (%):  [21 %-28 %] 21 %   Intake/Output Summary (Last 24 hours) at 02/12/2020 1013 Last data filed at 02/12/2020 0935 Gross per 24 hour  Intake 3562.65 ml  Output 2100 ml  Net 1462.65 ml   Filed Weights   02/06/20 0500 02/07/20 0326 02/08/20 0500  Weight:  59.8 kg 60.6 kg 59.7 kg    Examination:  General - minimally awake.  Eyes - pupils reactive  ENT - trach site intact with minimal secretions.  Cardiac - regular rate/rhythm, no murmur Chest - equal breath sounds b/l, no wheezing or rales Abdomen - distended but tender. Extremities - no edema.  Skin -  line sites intact.  Neuro - moves all limbs but not following commands. EVD clamped.    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 Acute encephalopathy due subarachnoid hemorrhage. - Allow fentanyl to clear. If remains confused - neurosurgery to reassess and consider new EVD placement.  Fever likely from tracheobronchitis with Staph aureus and GNR in sputum culture. - day 2/7 of ceftriaxone - Citrobacter and S.aureus are susceptible.   Acute respiratory failure due to subarachnoid hemorrhage. Failure to wean s/p tracheostomy. - Continue on trach collar - Downsize to cuffless trach on 11/17 if remains off ventilator.  Thyroid mass with tracheal compression. - s/p total thyroidectomy 11/12 - post op care per ENT - continue synthroid  Hypertension. - goal SBP < 140 - continue losartan, amlodipine, labetalol  Hypernatremia. Improving slowly - continue free water - f/u BMET  Anemia of critical illness. - f/u CBC - transfuse for Hb < 7 or significant bleeding   Daily Goals Checklist  Pain/Anxiety/Delirium protocol (if indicated): wean Precedex to off.  Neuro vitals: every 4 hours AED's: none VAP protocol (if  indicated): bundle in place. Respiratory support goals: trach collar today.  Blood pressure target: SBP<200 until out of vasospasm window, then <140 DVT prophylaxis: heparin tid Nutrition Status: moderate nutritional risk continue tube feeds.  GI prophylaxis: Protonix. Dulcolax for constipation. Fluid status goals: positive fluid  balance.  Urinary catheter: external catheter.  Central lines: midline catheter. Glucose control: stress hyperglycemia - adequate control.  Mobility/therapy needs: PT, OT - progressive ambulation Antibiotic de-escalation: Complete 7day  course of ceftriaxone. Home medication reconciliation: on hold.  Daily labs: BMP daily. CBC twice weekly  Code Status: full code.  Family Communication: will update daughter Disposition: ICU   Labs    CMP Latest Ref Rng & Units 02/11/2020 02/10/2020 02/10/2020  Glucose 70 - 99 mg/dL 200(H) - -  BUN 8 - 23 mg/dL 20 - -  Creatinine 0.44 - 1.00 mg/dL 0.77 - -  Sodium 135 - 145 mmol/L 153(H) - -  Potassium 3.5 - 5.1 mmol/L 3.5 - -  Chloride 98 - 111 mmol/L 120(H) - -  CO2 22 - 32 mmol/L 23 - -  Calcium 8.9 - 10.3 mg/dL 8.7(L) 8.7(L) 8.5(L)  Total Protein 6.5 - 8.1 g/dL - - -  Total Bilirubin 0.3 - 1.2 mg/dL - - -  Alkaline Phos 38 - 126 U/L - - -  AST 15 - 41 U/L - - -  ALT 0 - 44 U/L - - -    CBC Latest Ref Rng & Units 02/10/2020 02/09/2020 02/08/2020  WBC 4.0 - 10.5 K/uL 14.8(H) 19.6(H) 16.8(H)  Hemoglobin 12.0 - 15.0 g/dL 8.3(L) 9.3(L) 9.8(L)  Hematocrit 36 - 46 % 27.3(L) 30.4(L) 33.1(L)  Platelets 150 - 400 K/uL 151 164 PLATELET CLUMPS NOTED ON SMEAR, COUNT APPEARS ADEQUATE    ABG    Component Value Date/Time   PHART 7.444 02/06/2020 0843   PCO2ART 42.8 02/06/2020 0843   PO2ART 157 (H) 02/06/2020 0843   HCO3 29.2 (H) 02/06/2020 0843   TCO2 30 02/06/2020 0843   ACIDBASEDEF 4.0 (H) 01/31/2020 0113   O2SAT 99.0 02/06/2020 0843    CBG (last 3)  Recent Labs    02/11/20 2335 02/12/20 0333 02/12/20 0830  GLUCAP [address] [place], MD [facility] ICU Physician [place]  Pager: [phone] Mobile: [phone] After hours: [phone].

## 2020-02-12 NOTE — Progress Notes (Signed)
  NEUROSURGERY PROGRESS NOTE   No issues overnight.  No concerns this am  EXAM:  BP (!) 204/97   Pulse (!) 107   Temp 98.8 F (37.1 C) (Axillary)   Resp (!) 29   Ht 5\' 9"  (1.753 m)   Wt 59.7 kg   SpO2 97%   BMI 19.44 kg/m   Awake, alert On trach collar Nods head appropriately to questions Briskly follows commands CN grossly intact. essentially normal strength strength Wound c/d/i EVD in place, but clamped   IMPRESSION/PLAN 64 y.o. female  POD# 4 SAHd# 44 s/p clipping Left PICA, neurologically grossly stable. POD#4 s/p thyroidectomy/trach doing well. CT head shows decompression of ventricles, catheter tip not in ventricle system.   - EVD removed. No indication to replace at this time. - Cont Nimotop - Downsize trach on Wed if tolerating trach collar. - Likely SNF

## 2020-02-13 ENCOUNTER — Encounter (HOSPITAL_COMMUNITY): Payer: Self-pay | Admitting: Otolaryngology

## 2020-02-13 DIAGNOSIS — L8990 Pressure ulcer of unspecified site, unspecified stage: Secondary | ICD-10-CM | POA: Diagnosis not present

## 2020-02-13 DIAGNOSIS — I639 Cerebral infarction, unspecified: Secondary | ICD-10-CM

## 2020-02-13 LAB — BASIC METABOLIC PANEL
Anion gap: 12 (ref 5–15)
BUN: 16 mg/dL (ref 8–23)
CO2: 23 mmol/L (ref 22–32)
Calcium: 8.9 mg/dL (ref 8.9–10.3)
Chloride: 112 mmol/L — ABNORMAL HIGH (ref 98–111)
Creatinine, Ser: 0.61 mg/dL (ref 0.44–1.00)
GFR, Estimated: >60 mL/min (ref >60)
Glucose, Bld: 132 mg/dL — ABNORMAL HIGH (ref 70–99)
Potassium: 3.1 mmol/L — ABNORMAL LOW (ref 3.5–5.1)
Sodium: 147 mmol/L — ABNORMAL HIGH (ref 135–145)

## 2020-02-13 LAB — GLUCOSE, CAPILLARY
Glucose-Capillary: 115 mg/dL — ABNORMAL HIGH (ref 70–99)
Glucose-Capillary: 117 mg/dL — ABNORMAL HIGH (ref 70–99)
Glucose-Capillary: 127 mg/dL — ABNORMAL HIGH (ref 70–99)
Glucose-Capillary: 128 mg/dL — ABNORMAL HIGH (ref 70–99)
Glucose-Capillary: 140 mg/dL — ABNORMAL HIGH (ref 70–99)
Glucose-Capillary: 152 mg/dL — ABNORMAL HIGH (ref 70–99)

## 2020-02-13 MED ORDER — GUAIFENESIN 100 MG/5ML PO SOLN
5.0000 mL | ORAL | Status: DC | PRN
  Administered 2020-02-13 – 2020-04-21 (×19): 100 mg
  Filled 2020-02-13: qty 10
  Filled 2020-02-13: qty 5
  Filled 2020-02-13 (×4): qty 10
  Filled 2020-02-13: qty 20
  Filled 2020-02-13 (×5): qty 10
  Filled 2020-02-13 (×2): qty 15
  Filled 2020-02-13 (×2): qty 10
  Filled 2020-02-13: qty 5
  Filled 2020-02-13: qty 25
  Filled 2020-02-13: qty 10

## 2020-02-13 NOTE — Progress Notes (Signed)
RT assisted Pete NP CCM with trach change. Pt sutures removed and #8 Shiley removed and a #6 Cuffless Flexible Shiley placed. Pt tolerated well. Bilateral breath sounds heard and positive color change on the ETCO2 detector. RT will continue to monitor. RN made aware of trach change.

## 2020-02-13 NOTE — Progress Notes (Signed)
NAME:  Stacy Moran, MRN:  [id number], DOB:  [birth year], LOS: 40 ADMISSION DATE:  01/30/2020, CONSULTATION DATE:  01/30/20 REFERRING MD:  Ralene Ok, CHIEF COMPLAINT:  Headache   Brief History   64 y/o presented with large subarachnoid hemorrhage due to ruptured PICA with resultant hydrocephalus.    Past Medical History  No known [place] Hospital Events   11/03 Admitted 11/04 craniotomy with clipping of left PICA 11/12 total thyroidectomy, tracheostomy 11/13 start ABx for fever and tracheobronchitis 11/16 Now 11 days post clipping.  Drain stopped yesterday so left clamped.  Was interactive with PT yesterday. This morning, agitated and complaining of back pain. More somnolent following fentanyl this morning. Stable from neuro check  11/17  Consults:  ENT Constance Holster)  Procedures:  11/03 ETT >>11/09 11/03 EVD >> 11/09 ETT >> 11/12 11/12 Tracheostomy >>  Significant Diagnostic Tests:  11/3 CT head > large volume subarachnoid related to ruptured left PICA aneurysm, intraventricular reflux with hydrocephalus, chronic small vessel disease, chronic small vessel disease 11/9 H&N U/S > 2.3x2.3x2.4 mass in the left neck along the left thyroid lobe 11/17 CT head > near   Micro Data:  11/03 SARS2/ Flu > neg 11/08 respiratory culture >> negative 11/08 blood culture >> 11/11 urine culture >> multiple species 11/12 MRSA PCR >> negative 11/12 sputum: staph aureus, Citrobacter and Haemophilus parainfluenza (B lactamase +)   Antimicrobials:  Zosyn 11/13 >> 11/14 Ceftriaxone 11/14 - 11/20  Interim history/subjective:  Awake. No distress.   Objective   Blood pressure (Abnormal) 176/87, pulse 93, temperature 99.8 F (37.7 C), temperature source Oral, resp. rate (Abnormal) 28, height 5\' 9"  (1.753 m), weight 59.7 kg, SpO2 100 %.    FiO2 (%):  [21 %] 21 %   Intake/Output Summary (Last 24 hours) at 02/13/2020 0915 Last data filed at 02/13/2020 0800 Gross per 24 hour  Intake 3228.85  ml  Output 1125 ml  Net 2103.85 ml   Filed Weights   02/06/20 0500 02/07/20 0326 02/08/20 0500  Weight: 59.8 kg 60.6 kg 59.7 kg    Examination:  General she is awake. Cooperative and interactive. No distress HENT  staples intact. Cuffed trach is unremarkable but does have copious thick mucous  Pulm scattered rhonchi but no accessory use Card RRR abd soft not tender + bowel sounds Neuro awake and alert. Moving all ext interactive   Resolved Hospital Problem list     Assessment & Plan:   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 Acute encephalopathy due subarachnoid hemorrhage. Plan Cont nimodipine per n-surg Serial neuro checks  Acute respiratory failure due to subarachnoid hemorrhage. Failure to wean s/p tracheostomy. Now off vent  Plan Change to cuffless Wean oxygen Routine trach care SLP eval  Fever likely from tracheobronchitis Beta lactamase + H flu, Citrobacter and S.aureus are susceptible.  Plan Day 5/7 total abx (now on CTX)  Thyroid mass with tracheal compression. - s/p total thyroidectomy 11/12 Plan Cont synthroid   Hypertension. Plan Cont Losartan and labetalol   Fluid and electrolyte imbalance: hypernatremia, hyperchloremia -have been giving free water VT Plan Am chemistry now and re-assess free water needs  Anemia of critical illness. Plan Trend cbc  Hyperglycemia Plan ssi   Daily Goals Checklist  Pain/Anxiety/Delirium protocol (if indicated): wean Precedex to off.  Neuro vitals: every 4 hours AED's: none VAP protocol (if indicated): bundle in place. Respiratory support goals: trach collar today.  Blood pressure target: SBP<200 until out of  vasospasm window, then <140 DVT prophylaxis: heparin tid Nutrition Status: moderate nutritional risk continue tube feeds.  GI prophylaxis: Protonix. Dulcolax for constipation. Fluid status goals: positive fluid balance.   Urinary catheter: external catheter.  Central lines: midline catheter. Glucose control: stress hyperglycemia - adequate control.  Mobility/therapy needs: PT, OT - progressive ambulation Antibiotic de-escalation: Complete 7day  course of ceftriaxone. Home medication reconciliation: on hold.  Daily labs: BMP daily. CBC twice weekly  Code Status: full code.  Family Communication: will update daughter Disposition: could go to progressive if OK w/ primary service  Erick Colace ACNP-BC [place] Pager # [phone] OR # [phone] if no answer

## 2020-02-13 NOTE — Progress Notes (Signed)
  NEUROSURGERY PROGRESS NOTE   No issues overnight.  Complains of HA this am  EXAM:  BP (!) 186/86   Pulse 94   Temp 99.3 F (37.4 C) (Oral)   Resp 20   Ht 5\' 9"  (1.753 m)   Wt 59.7 kg   SpO2 98%   BMI 19.44 kg/m   Awake, alert Trach Mouths responses, Nods head appropriately to questions Briskly follows commands with essentially normal strength in BUE/BLE CN grossly intact. Wound c/d/i  IMPRESSION/PLAN 64 y.o. female POD# 14 SAHd# 90 s/p clipping Left PICA. POD#5 s/p thyroidectomy/trach doing well. Doing well this am, neurologically stable. tracheobronchitis - Cont Nimotop - defer trach to CCM - continue rocephin per CCM for tracheobronchitis. - Likely SNF

## 2020-02-13 NOTE — Progress Notes (Signed)
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  11/4, pt s/p L far-lateral craniotomy for clipping of PICA aneurysm. 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   OT comments  Pt continues to make steady progress towards OT goals. She tolerated sitting EOB >5 min with fluctuating levels of assist (overall min-modA, briefly with minguardA) and sit<>stand trials. Overall pt remains globally weak and deconditioned (L side weaker > R). She requires two person assist for completion of mobility tasks today. Pt followed commands well and writing down some of her needs/answers to questions. VSS throughout. Have updated d/c recommendations given pt steady progress and motivation/willingess to participate in therapies. Feel she will benefit from CIR level therapies at time of discharge. Will continue to follow acutely.   Follow Up Recommendations  CIR    Equipment Recommendations  Wheelchair (measurements OT);Wheelchair cushion (measurements OT);Hospital bed    Recommendations for Other Services Rehab consult    Precautions / Restrictions Precautions Precautions: Fall Precaution Comments: trach Restrictions Weight Bearing Restrictions: No       Mobility Bed Mobility Overal bed mobility: Needs Assistance Bed Mobility: Rolling;Sidelying to Sit;Sit to Supine Rolling: Mod assist Sidelying to sit: Mod assist;+2 for physical assistance;+2 for safety/equipment   Sit to supine: Max assist;+2 for physical assistance   General bed mobility comments: mod assist for roll to L for truncal translation, step-by-step cuing for sequencing task.  mod-max +2 for supine<>sit for trunk and LE management, scooting to and from EOB.  Transfers Overall transfer level: Needs assistance Equipment used: 2 person hand held assist Transfers: Sit to/from Stand Sit to Stand: Max assist;+2 physical assistance         General transfer comment: Max +2 for power up, hip extension via posterior facilitation, and steadying upon standing. Standing tolerance x5 seconds, standing trials x2.    Balance Overall balance assessment: Needs assistance Sitting-balance support: No upper extremity supported;Feet supported Sitting balance-Leahy Scale: Fair Sitting balance - Comments: requires min-mod posterior assist to maintain upright sitting initially, transitioning to supervision level of assist only Postural control: Posterior lean   Standing balance-Leahy Scale: Zero Standing balance comment: max +2 for standing                           ADL either performed or assessed with clinical judgement   ADL Overall ADL's : Needs assistance/impaired                     Lower Body Dressing: Maximal assistance;+2 for physical assistance;+2 for safety/equipment;Sit to/from stand Lower Body Dressing Details (indicate cue type and reason): pt reaching towards socks to pull higher up on her feet             Functional mobility during ADLs: Maximal assistance;+2 for physical assistance;+2 for safety/equipment                         Cognition Arousal/Alertness: Awake/alert Behavior During Therapy: Impulsive Overall Cognitive Status: Impaired/Different from baseline Area of Impairment: Orientation;Attention;Memory;Following commands;Safety/judgement;Problem solving                 Orientation Level: Disoriented  to;Time;Situation Current Attention Level: Sustained Memory: Decreased short-term memory Following Commands: Follows one step commands consistently Safety/Judgement: Decreased awareness of deficits;Decreased  awareness of safety   Problem Solving: Requires verbal cues;Requires tactile cues;Difficulty sequencing General Comments: oriented to place when given options to choose from, communicates with PT/OT via writing during session. Pt moves quickly and at times impulsively, requires step-by-step cues for safety during mobility. L inattention with head physically turned to R        Exercises Exercises: General Upper Extremity General Exercises - Upper Extremity Shoulder Flexion: AROM;AAROM;Both;10 reps General Exercises - Lower Extremity Ankle Circles/Pumps: AROM;Both;15 reps;Supine Long Arc Quad: AAROM;Both;10 reps;Seated Heel Slides: AAROM;Both;5 reps;Supine (in heel-to-shin motion, incoordination with R-on-L shin)   Shoulder Instructions       General Comments      Pertinent Vitals/ Pain       Pain Assessment: Faces Faces Pain Scale: Hurts little more Pain Location: abdomen Pain Descriptors / Indicators: Discomfort;Grimacing Pain Intervention(s): Monitored during session;Repositioned  Home Living                                          Prior Functioning/Environment              Frequency  Min 2X/week        Progress Toward Goals  OT Goals(current goals can now be found in the care plan section)  Progress towards OT goals: Progressing toward goals  Acute Rehab OT Goals Patient Stated Goal: none stated agreeable to working with therapies OT Goal Formulation: With patient Time For Goal Achievement: 02/25/20 Potential to Achieve Goals: Good ADL Goals Pt Will Perform Grooming: with min assist;sitting Pt Will Perform Upper Body Bathing: with min assist;sitting Pt/caregiver will Perform Home Exercise Program: Increased strength;Increased ROM;Both right and left upper extremity;With minimal assist;With written HEP provided Additional ADL Goal #1: Pt will follow multi-step commands with 75% accuracy and no more than min cues. Additional ADL Goal #2:  Pt will perform bed mobility with modA as precursor to EOB/OOB ADL. Additional ADL Goal #3: Pt will maintain static balacne EOB >5 min with minA as precursor to ADL.  Plan Discharge plan needs to be updated    Co-evaluation    PT/OT/SLP Co-Evaluation/Treatment: Yes Reason for Co-Treatment: Complexity of the patient's impairments (multi-system involvement);For patient/therapist safety;To address functional/ADL transfers   OT goals addressed during session: ADL's and self-care      AM-PAC OT "6 Clicks" Daily Activity     Outcome Measure   Help from another person eating meals?: Total Help from another person taking care of personal grooming?: A Lot Help from another person toileting, which includes using toliet, bedpan, or urinal?: A Lot Help from another person bathing (including washing, rinsing, drying)?: A Lot Help from another person to put on and taking off regular upper body clothing?: A Lot Help from another person to put on and taking off regular lower body clothing?: A Lot 6 Click Score: 11    End of Session Equipment Utilized During Treatment: Oxygen  OT Visit Diagnosis: Unsteadiness on feet (R26.81);Muscle weakness (generalized) (M62.81);Other abnormalities of gait and mobility (R26.89) Hemiplegia - Right/Left: Left Hemiplegia - dominant/non-dominant: Non-Dominant Hemiplegia - caused by: Nontraumatic intracerebral hemorrhage   Activity Tolerance Patient tolerated treatment well   Patient Left in bed;with call bell/phone within reach;with bed alarm set   Nurse Communication Mobility status  Time: 9037-9558 OT Time Calculation (min): 27 min  Charges: OT General Charges $OT Visit: 1 Visit OT Treatments $Self Care/Home Management : 8-22 mins  Lou Cal, OT Acute Rehabilitation Services Pager [phone] Office [phone]    Raymondo Band 02/13/2020, 5:46 PM

## 2020-02-13 NOTE — Progress Notes (Signed)
Verified SBP goal of <220 per [facility].

## 2020-02-13 NOTE — Procedures (Signed)
Tracheostomy Exchange Procedure Note  Stacy Moran  [id number]  Jan 15, 1956  Date:02/13/20  Time:11:30 AM   Provider Performing:Pete E Kary Kos   Procedure: Tracheostomy Exchange Through Immature Stoma [id number])  Indication(s) To facilitate PMV   Consent Risks of the procedure as well as the alternatives and risks of each were explained to the patient and/or caregiver.  Consent for the procedure was obtained and is signed in the bedside chart  Anesthesia None   Time Out Verified patient identification, verified procedure, site/side was marked, verified correct patient position, special equipment/implants available, medications/allergies/relevant history reviewed, required imaging and test results available.   Sterile Technique Hand hygiene, gloves   Procedure Description Size 8 cuffed existing Shiley removed and size 6 uncuffed Shiley placed through stoma.   Complications/Tolerance None; patient tolerated the procedure well..   EBL Minimal Erick Colace ACNP-BC [place] Pager # [phone] OR # [phone] if no answer

## 2020-02-13 NOTE — Progress Notes (Signed)
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  11/4, pt s/p L far-lateral craniotomy for clipping of PICA aneurysm. 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    PT Comments    Pt demonstrating mobility progression this day, standing at EOB with max +2 assist and engaging in multiple dynamic seated tasks. Pt with L inattention, but responds well when given multimodal cuing to attend to L. Pt continuing to demonstrate weakness L>R, along with significant incoordination. Given pt's improving activity tolerance and motivation to return home, PT recommending CIR post-acutely.   Follow Up Recommendations  Other (comment);CIR     Equipment Recommendations  Other (comment) (TBA)    Recommendations for Other Services       Precautions / Restrictions Precautions Precautions: Fall Precaution Comments: trach Restrictions Weight Bearing Restrictions: No    Mobility  Bed Mobility Overal bed mobility: Needs Assistance Bed Mobility: Rolling;Sidelying to Sit;Sit to Supine Rolling: Mod assist Sidelying to sit: Mod assist;+2 for physical assistance;+2 for safety/equipment   Sit to supine: Max assist;+2 for physical assistance   General bed mobility comments: mod assist for roll to L for truncal translation, step-by-step cuing for sequencing task. mod-max +2 for supine<>sit for trunk and LE management, scooting to and from EOB.  Transfers Overall transfer level: Needs assistance Equipment used: 2 person hand held assist Transfers: Sit to/from Stand Sit to Stand: Max assist;+2 physical assistance         General transfer  comment: Max +2 for power up, hip extension via posterior facilitation, and steadying upon standing. Standing tolerance x5 seconds, standing trials x2.  Ambulation/Gait             General Gait Details: NT   Stairs             Wheelchair Mobility    Modified Rankin (Stroke Patients Only) Modified Rankin (Stroke Patients Only) Pre-Morbid Rankin Score: No symptoms Modified Rankin: Severe disability     Balance Overall balance assessment: Needs assistance Sitting-balance support: No upper extremity supported;Feet supported Sitting balance-Leahy Scale: Fair Sitting balance - Comments: requires min-mod posterior assist to maintain upright sitting initially, transitioning to supervision level of assist only Postural control: Posterior lean   Standing balance-Leahy Scale: Zero Standing balance comment: max +2 for standing                            Cognition Arousal/Alertness: Awake/alert Behavior During Therapy: Impulsive Overall Cognitive Status: Impaired/Different from baseline Area of Impairment: Orientation;Attention;Memory;Following commands;Safety/judgement;Problem solving                 Orientation Level: Disoriented to;Time;Situation Current Attention Level: Sustained Memory: Decreased short-term memory Following Commands: Follows one step commands consistently Safety/Judgement: Decreased awareness of deficits;Decreased awareness of safety   Problem Solving: Requires verbal cues;Requires tactile cues;Difficulty sequencing General Comments: oriented to place when given options to choose from, communicates with PT/OT via writing during session. Pt moves quickly and at times impulsively, requires step-by-step cues for safety during mobility. L inattention with head physically turned to R      Exercises General Exercises - Lower Extremity Ankle Circles/Pumps: AROM;Both;15 reps;Supine Long Arc Quad: AAROM;Both;10 reps;Seated  Heel Slides:  AAROM;Both;5 reps;Supine (in heel-to-shin motion, incoordination with L-on-R shin)    General Comments        Pertinent Vitals/Pain Pain Assessment: Faces Faces Pain Scale: Hurts little more Pain Location: abdomen Pain Descriptors / Indicators: Discomfort;Grimacing Pain Intervention(s): Limited activity within patient's tolerance;Monitored during session;Repositioned    Home Living                      Prior Function            PT Goals (current goals can now be found in the care plan section) Acute Rehab PT Goals PT Goal Formulation: With patient Time For Goal Achievement: 02/16/20 Potential to Achieve Goals: Fair Progress towards PT goals: Progressing toward goals    Frequency    Min 4X/week      PT Plan Current plan remains appropriate    Co-evaluation PT/OT/SLP Co-Evaluation/Treatment: Yes Reason for Co-Treatment: For patient/therapist safety;To address functional/ADL transfers          AM-PAC PT "6 Clicks" Mobility   Outcome Measure  Help needed turning from your back to your side while in a flat bed without using bedrails?: A Lot Help needed moving from lying on your back to sitting on the side of a flat bed without using bedrails?: A Lot Help needed moving to and from a bed to a chair (including a wheelchair)?: Total Help needed standing up from a chair using your arms (e.g., wheelchair or bedside chair)?: Total Help needed to walk in hospital room?: Total Help needed climbing 3-5 steps with a railing? : Total 6 Click Score: 8    End of Session Equipment Utilized During Treatment: Oxygen (via trach) Activity Tolerance: Patient tolerated treatment well Patient left: in bed;with bed alarm set;with call bell/phone within reach Nurse Communication: Mobility status PT Visit Diagnosis: Hemiplegia and hemiparesis;Other symptoms and signs involving the nervous system (R29.898);Other abnormalities of gait and mobility (R26.89) Hemiplegia -  Right/Left: Left Hemiplegia - dominant/non-dominant: Non-dominant Hemiplegia - caused by: Other Nontraumatic intracranial hemorrhage     Time: 1420-1448 PT Time Calculation (min) (ACUTE ONLY): 28 min  Charges:  $Therapeutic Activity: 8-22 mins                     Gleb Mcguire E, PT Acute Rehabilitation Services Pager [phone]  Office [phone]   Alin Chavira D [name] 02/13/2020, 3:03 PM

## 2020-02-13 NOTE — Progress Notes (Signed)
Belongings sent home with daughter

## 2020-02-13 NOTE — Progress Notes (Addendum)
Transferred patient to [phone] with belongings. Most of the belongings that were writing in a previous note had been taken home by daughter per Trish Mage, RN.  Contacted daughter to inform of transfer.

## 2020-02-13 NOTE — Progress Notes (Signed)
Pt arrived to the unit via bed. Airway equipment has been set up by RT. Pt has all belongings (Phone is with the daughter). All IV and tele is connected. Pt has telephone and Call light at hand.   02/13/20 1727  Vitals  Temp 99.1 F (37.3 C)  Temp Source Oral  BP (!) 180/97  MAP (mmHg) 120  BP Location Left Arm  BP Method Automatic  Patient Position (if appropriate) Lying  Pulse Rate Source Monitor  Resp 18  Level of Consciousness  Level of Consciousness Alert  MEWS COLOR  MEWS Score Color Green  Oxygen Therapy  O2 Device Tracheostomy Collar  O2 Flow Rate (L/min) 5 L/min  FiO2 (%) 21 %  Patient Activity (if Appropriate) In bed  Pulse Oximetry Type Continuous  SpO2 Alarm Limit Low 92  Oximetry Probe Site Changed No  MEWS Score  MEWS Temp 0  MEWS Systolic 0  MEWS Pulse 0  MEWS RR 0  MEWS LOC 0  MEWS Score 0

## 2020-02-14 ENCOUNTER — Inpatient Hospital Stay (HOSPITAL_COMMUNITY): Payer: Self-pay

## 2020-02-14 LAB — GLUCOSE, CAPILLARY
Glucose-Capillary: 69 mg/dL — ABNORMAL LOW (ref 70–99)
Glucose-Capillary: 101 mg/dL — ABNORMAL HIGH (ref 70–99)
Glucose-Capillary: 71 mg/dL (ref 70–99)
Glucose-Capillary: 58 mg/dL — ABNORMAL LOW (ref 70–99)
Glucose-Capillary: 85 mg/dL (ref 70–99)
Glucose-Capillary: 127 mg/dL — ABNORMAL HIGH (ref 70–99)
Glucose-Capillary: 125 mg/dL — ABNORMAL HIGH (ref 70–99)

## 2020-02-14 IMAGING — DX DG CHEST 1V PORT
1 series · 1 of 1 positions shown · non-contrast
Comparison: Prior radiograph from [DATE].

CLINICAL DATA: Follow-up examination for acute respiratory failure.

EXAM:
PORTABLE CHEST 1 VIEW

[chest]
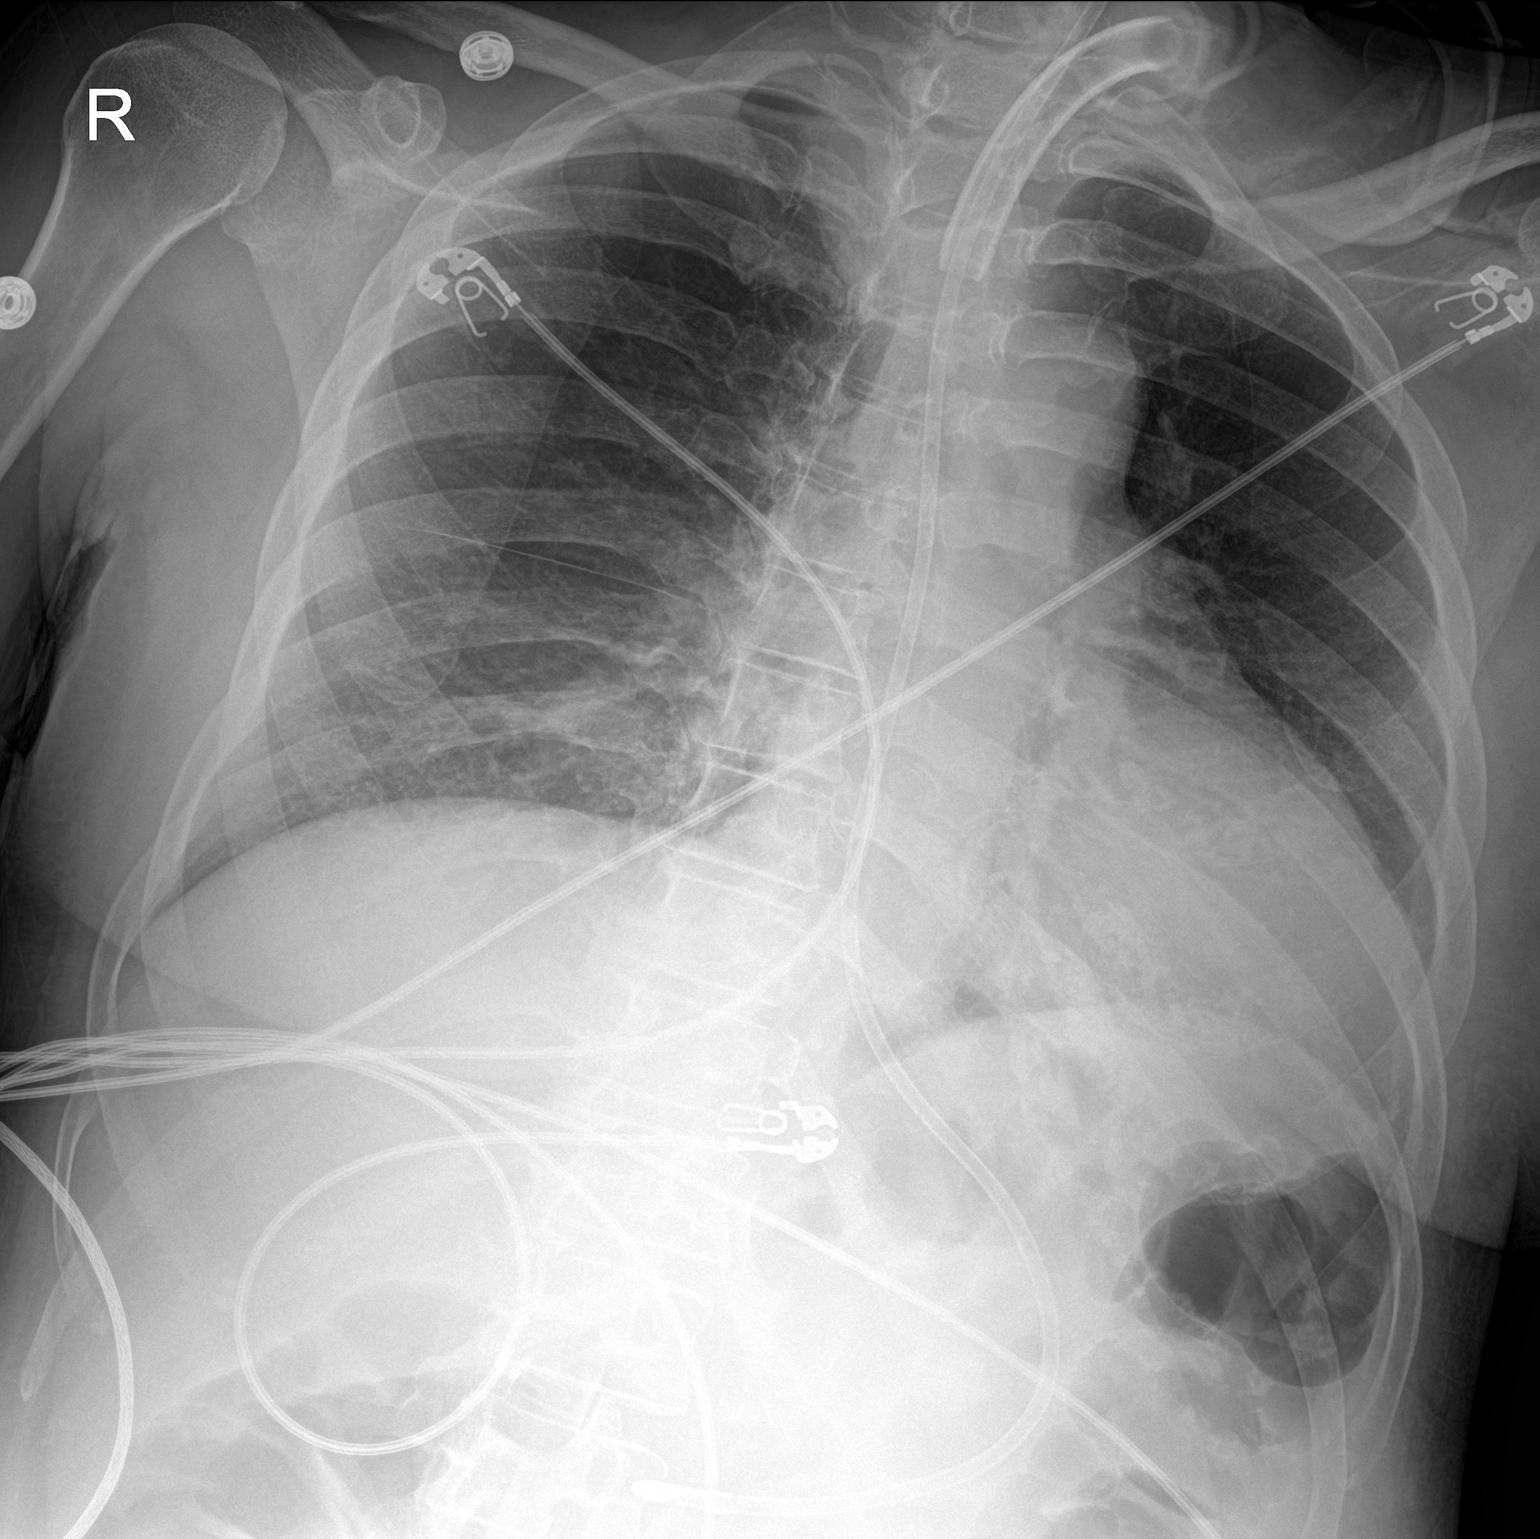

[1 of 1 positions shown; findings below may reference images not displayed]

FINDINGS: Tracheostomy tube in place overlying the upper airway, tip well
above the carina. Dobbhoff feeding tube in place with tip overlying
the stomach. Cardiomegaly, stable. Mediastinal silhouette within
normal limits.

Lungs well inflated. Mild streaky right basilar opacity, most
consistent with atelectasis. No other focal airspace disease. No
pulmonary edema or visible pleural effusion. No pneumothorax.

No acute osseous finding.
IMPRESSION: 1. Support apparatus in satisfactory position.
2. Mild right basilar subsegmental atelectasis. No other active
cardiopulmonary disease.

## 2020-02-14 MED ORDER — DEXTROSE 50 % IV SOLN
INTRAVENOUS | Status: AC
  Administered 2020-02-14: 25 mL
  Filled 2020-02-14: qty 50

## 2020-02-14 MED ORDER — INSULIN DETEMIR 100 UNIT/ML ~~LOC~~ SOLN
12.0000 [IU] | Freq: Every day | SUBCUTANEOUS | Status: DC
  Administered 2020-02-14 – 2020-02-15 (×2): 12 [IU] via SUBCUTANEOUS
  Filled 2020-02-14 (×8): qty 0.12

## 2020-02-14 MED ORDER — DEXTROSE 50 % IV SOLN
INTRAVENOUS | Status: AC
  Administered 2020-02-14: 50 mL
  Filled 2020-02-14: qty 50

## 2020-02-14 MED ORDER — GLYCOPYRROLATE 0.2 MG/ML IJ SOLN
0.1000 mg | Freq: Two times a day (BID) | INTRAMUSCULAR | Status: AC
  Administered 2020-02-14 – 2020-02-17 (×6): 0.1 mg via INTRAVENOUS
  Filled 2020-02-14 (×8): qty 1

## 2020-02-14 MED ORDER — GLYCOPYRROLATE 0.2 MG/ML IJ SOLN: 0.1000 mg | Freq: Two times a day (BID) | INTRAMUSCULAR | Status: DC

## 2020-02-14 NOTE — Progress Notes (Signed)
Pt is tachypnic, restless, desaturating, secretion from the tracheostomy looks more like the feed than mucous. Called respiratory therapist, Pt was bagged, changde from air to high flow oxygen, called elink,  verbal  order to stop tube feed,  and stat order for xray were placed

## 2020-02-14 NOTE — Progress Notes (Signed)
  NEUROSURGERY PROGRESS NOTE    Hypoglycemia overnight Sleeping when I entered. Awakened to voice No concerns this am  EXAM:  BP 129/75   Pulse 82   Temp 98 F (36.7 C)   Resp (!) 22   Ht 5\' 9"  (1.753 m)   Wt 59.7 kg   SpO2 97%   BMI 19.44 kg/m   Awake, alert Trach Mouths responses, Nods head appropriately to questions Briskly follows commands with essentially normal strength in BUE/BLE CN grossly intact. Wound c/d/i  IMPRESSION/PLAN 64 y.o. female POD# 15 SAHd# 1 s/p clipping Left PICA. POD#5 s/p thyroidectomy/trach doing well. Neurologically stable. tracheobronchitis - Cont Nimotop - CIR candidate. Consult placed - adjusted insulin per DM coordinator

## 2020-02-14 NOTE — Progress Notes (Signed)
Inpatient Diabetes Program Recommendations  AACE/ADA: New Consensus Statement on Inpatient Glycemic Control (2015)  Target Ranges:  Prepandial:   less than 140 mg/dL      Peak postprandial:   less than 180 mg/dL (1-2 hours)      Critically ill patients:  140 - 180 mg/dL   Lab Results  Component Value Date   GLUCAP 58 (L) 02/14/2020   HGBA1C 6.2 (H) 01/30/2020    Review of Glycemic Control Results for [name], [name] (MRN [id number]) as of 02/14/2020 12:56  Ref. Range 02/13/2020 23:34 02/14/2020 03:19 02/14/2020 05:55 02/14/2020 08:19 02/14/2020 11:54  Glucose-Capillary Latest Ref Range: 70 - 99 mg/dL 152 (H) 69 (L) 101 (H) 71 58 (L)   Current orders for Inpatient glycemic control:  Novolog 0-20 q4h & Levemir 15 units daily  Inpatient Diabetes Program Recommendations:     Noted hypoglycemia this morning.  cbg's overall running on lower end.  Might consider, Levemir 12 units daily.  Will continue to follow while inpatient.  Thank you, Reche Dixon, RN, BSN Diabetes Coordinator Inpatient Diabetes Program [phone] (team pager from 8a-5p)

## 2020-02-14 NOTE — Progress Notes (Signed)
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  11/4, pt s/p L far-lateral craniotomy for clipping of PICA aneurysm. 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    PT Comments    Pt was not able to follow commands as well and with impulsivity that limited today.  She required min-mod A at EOB for balance and was not able to progress to standing due to decreased safety.  Tried multiple positions at EOB for balance. Required assist of 2 for transfers.   Follow Up Recommendations  Other (comment);CIR     Equipment Recommendations  Other (comment) (to be determined next venue)    Recommendations for Other Services       Precautions / Restrictions Precautions Precautions: Fall Precaution Comments: trach    Mobility  Bed Mobility Overal bed mobility: Needs Assistance Bed Mobility: Rolling;Sidelying to Sit;Sit to Sidelying Rolling: Mod assist;+2 for physical assistance Sidelying to sit: Mod assist;+2 for physical assistance;+2 for safety/equipment   Sit to supine: +2 for physical assistance;Mod assist;+2 for safety/equipment   General bed mobility comments: Rolling both sides with assist to position extremeties to assist, required increased assist to roll R compared to L;  For supine/sidelying/sit: requiring assist for trunk and L LE, cues for sequencing and assist to control trunk  Transfers                 General transfer comment: did not attempt - pt impulsive and not following commands as well today, unsafe to stand  Ambulation/Gait                 Stairs             Wheelchair Mobility     Modified Rankin (Stroke Patients Only) Modified Rankin (Stroke Patients Only) Pre-Morbid Rankin Score: No symptoms Modified Rankin: Severe disability     Balance Overall balance assessment: Needs assistance Sitting-balance support: Feet supported;Bilateral upper extremity supported Sitting balance-Leahy Scale: Poor Sitting balance - Comments: Pt requiring UE support today, frequently shifting weight, tendency for posterior lean.  Attempted with hands at side and then placed chair in front of pt with hands on armrest - this improved posterior lean but pt continually required min-mod A for balance.                                    Cognition Arousal/Alertness: Awake/alert Behavior During Therapy: Impulsive Overall Cognitive Status: Impaired/Different from baseline Area of Impairment: Orientation;Attention;Memory;Following commands;Safety/judgement;Problem solving;Awareness                 Orientation Level: Disoriented to;Time;Situation Current Attention Level: Sustained Memory: Decreased short-term memory Following Commands: Follows one step commands inconsistently Safety/Judgement: Decreased awareness of deficits;Decreased awareness of safety Awareness: Intellectual Problem Solving: Requires verbal cues;Requires tactile cues;Difficulty sequencing General Comments: Pt did not want to communicate with writing today.  She continues to move quickly and impulsively at times.  Required increased cues for transfers and decreased consistency with commands.      Exercises      General Comments General comments (skin integrity, edema, etc.): VSS      Pertinent Vitals/Pain Pain  Assessment: No/denies pain    Home Living                      Prior Function            PT Goals (current goals can now be found in the care plan section) Acute Rehab PT Goals Patient Stated Goal: none stated agreeable to working with therapies PT Goal Formulation: With  patient Time For Goal Achievement: 02/16/20 Potential to Achieve Goals: Fair Progress towards PT goals: Progressing toward goals    Frequency    Min 4X/week      PT Plan Current plan remains appropriate    Co-evaluation              AM-PAC PT "6 Clicks" Mobility   Outcome Measure  Help needed turning from your back to your side while in a flat bed without using bedrails?: A Lot Help needed moving from lying on your back to sitting on the side of a flat bed without using bedrails?: A Lot Help needed moving to and from a bed to a chair (including a wheelchair)?: Total Help needed standing up from a chair using your arms (e.g., wheelchair or bedside chair)?: Total Help needed to walk in hospital room?: Total Help needed climbing 3-5 steps with a railing? : Total 6 Click Score: 8    End of Session Equipment Utilized During Treatment: Oxygen;Other (comment) (28% fiO2 trach collar) Activity Tolerance: Patient tolerated treatment well Patient left: in bed;with bed alarm set;with call bell/phone within reach;Other (comment) (head elevated to 35 degrees) Nurse Communication: Mobility status PT Visit Diagnosis: Hemiplegia and hemiparesis;Other symptoms and signs involving the nervous system (R29.898);Other abnormalities of gait and mobility (R26.89) Hemiplegia - Right/Left: Left Hemiplegia - dominant/non-dominant: Non-dominant Hemiplegia - caused by: Other Nontraumatic intracranial hemorrhage     Time: 4854-6270 PT Time Calculation (min) (ACUTE ONLY): 23 min  Charges:  $Therapeutic Activity: 8-22 mins $Neuromuscular Re-education: 8-22 mins                     Abran Richard, PT Acute Rehab Services Pager [phone] [facility] [place] 02/14/2020, 3:23 PM

## 2020-02-14 NOTE — Progress Notes (Signed)
Rehab Admissions Coordinator Note:  Patient was screened by Raechel Ache for appropriateness for an Inpatient Acute Rehab Consult.  At this time, we are recommending Inpatient Rehab consult. AC will contact MD to request consult order.   Raechel Ache 02/14/2020, 2:26 PM  I can be reached at [phone].

## 2020-02-14 NOTE — Progress Notes (Signed)
[facility] Progress Note Patient Name: Stacy Moran DOB: 13-May-1955 MRN: [id number]   Date of Service  02/14/2020  HPI/Events of Note  CXR review shows mild right lower lobe atelectasis, otherwise clear chest without evidence of overt aspiration.  eICU Interventions  Resume trickle feeding and plan to advance as tolerated following daytime PCCM attending follow up.        Kerry Kass Beverely Suen 02/14/2020, 3:22 AM

## 2020-02-14 NOTE — Progress Notes (Signed)
[facility] Progress Note Patient Name: Stacy Moran DOB: 22-Jun-1955 MRN: [id number]   Date of Service  02/14/2020  HPI/Events of Note  Patient desaturated, suctioning appears to yield tube feed from the patients airway. Saturation back up to 94 %.  eICU Interventions  Stat portable CXR to r/o aspiration of tube feeds.        Kerry Kass Michaella Imai 02/14/2020, 1:04 AM

## 2020-02-14 NOTE — Progress Notes (Signed)
Called to bedside due to pt desat. Suctioned what appeared to be tube feed and changed TC from 21% to 35% ATC.

## 2020-02-14 NOTE — Progress Notes (Signed)
Hypoglycemic Event  CBG: 69  Treatment: Dextrose 58ml  Symptoms:None  Follow-up CBG: [id number] CBG Result:101  Possible Reasons for Event: Tube feed was held for about 4hrs  Comments/MD notified Elink    Murray Hodgkins A Essien-Akpan

## 2020-02-14 NOTE — Evaluation (Signed)
[facility] - Evaluation Patient Details  Name: Stacy Moran MRN: [id number] Date of Birth: 1955/05/13  Today's Date: 02/14/2020 Time: 0911-0948 SLP Time Calculation (min) (ACUTE ONLY): 37 min  Past Medical History: History reviewed. No pertinent past medical history. Past Surgical History:  Past Surgical History:  Procedure Laterality Date  . CRANIOTOMY Left 01/30/2020   Procedure: LEFT FAR LATERAL CRANIOTOMY FOR ANEURYSM CLIPPING;  Surgeon: Stacy Lose, MD;  Location: [facility];  Service: Neurosurgery;  Laterality: Left;  . IR ANGIO INTRA EXTRACRAN SEL INTERNAL CAROTID BILAT MOD SED  01/30/2020  . IR ANGIO VERTEBRAL SEL VERTEBRAL UNI L MOD SED  01/30/2020  . RADIOLOGY WITH ANESTHESIA N/A 01/30/2020   Procedure: IR WITH ANESTHESIA;  Surgeon: Stacy Lose, MD;  Location: [facility];  Service: Radiology;  Laterality: N/A;  . THYROIDECTOMY N/A 02/08/2020   Procedure: THYROIDECTOMY;  Surgeon: Stacy Gala, MD;  Location: [facility];  Service: ENT;  Laterality: N/A;  . TRACHEOSTOMY TUBE PLACEMENT N/A 02/08/2020   Procedure: TRACHEOSTOMY;  Surgeon: Stacy Gala, MD;  Location: [facility];  Service: ENT;  Laterality: N/A;   HPI:  64 y/o presented with large subarachnoid hemorrhage due to ruptured PICA with resultant hydrocephalus. Admitted on 11/3, [place] on 11/4, vented until 11/12 when pt was trached. Also found to have multinodular goiter complicating respiratory failure so total thyroidectomy also completed on 11/12. Drains removed on 11/14. ATC on 11/15. Changed to 6 cuffledd on 11/17.  There is question of left lingual deviation in notes.    Assessment / Plan / Recommendation Clinical Impression  Pt was seen for PMV evaluation. Pt has a large volume of thick, copious secretions in and around the tracheal site that were suctioned by SLP. RN also performed tracheal suctioning prior to PMV placement. During the session, the pt's SpO2 remained stable at 96 and RR fluctuated between 20-28.  PMV was placed for about ~15 minutes total with intermittent breaks to assess airway patency. Initially, PMV was placed for several breath cycles and when removed, air trapping was observed. During subsequent attempts with PMV placed, the pt was unable to acheive any voicing. However, she did demonstrate evidence that she was directing air through the upper airway. Despite cueing to produce a cough and throat clear, the pt was unable to expectorate any secretions. Recommend pt wear PMV with full supervision with SLP and other therapies. SLP will continue to f/u acutely.  SLP Visit Diagnosis: Aphonia (R49.1)    SLP Assessment  Patient needs continued Speech Lanaguage Pathology Services    Follow Up Recommendations       Frequency and Duration min 2x/week  2 weeks    PMSV Trial PMSV was placed for: ~ 15 minutes total with breaks Able to redirect subglottic air through upper airway: Yes Able to Attain Phonation: No Able to Expectorate Secretions: No Intelligibility: Intelligibility reduced Respirations During Trial: 26 SpO2 During Trial: 96 % Behavior: Alert;Responsive to questions   Tracheostomy Tube       Vent Dependency  Vent Dependent: No FiO2 (%): (S) 28 % (found on 40 decreased to 28)    Cuff Deflation Trial  GO          [name] 02/14/2020, 10:10 AM

## 2020-02-15 ENCOUNTER — Inpatient Hospital Stay (HOSPITAL_COMMUNITY): Payer: Self-pay

## 2020-02-15 DIAGNOSIS — R0682 Tachypnea, not elsewhere classified: Secondary | ICD-10-CM | POA: Insufficient documentation

## 2020-02-15 DIAGNOSIS — I729 Aneurysm of unspecified site: Secondary | ICD-10-CM

## 2020-02-15 DIAGNOSIS — K567 Ileus, unspecified: Secondary | ICD-10-CM

## 2020-02-15 DIAGNOSIS — K9189 Other postprocedural complications and disorders of digestive system: Secondary | ICD-10-CM

## 2020-02-15 DIAGNOSIS — R7303 Prediabetes: Secondary | ICD-10-CM | POA: Diagnosis present

## 2020-02-15 DIAGNOSIS — R140 Abdominal distension (gaseous): Secondary | ICD-10-CM

## 2020-02-15 DIAGNOSIS — I609 Nontraumatic subarachnoid hemorrhage, unspecified: Secondary | ICD-10-CM | POA: Insufficient documentation

## 2020-02-15 DIAGNOSIS — D72829 Elevated white blood cell count, unspecified: Secondary | ICD-10-CM

## 2020-02-15 DIAGNOSIS — E876 Hypokalemia: Secondary | ICD-10-CM

## 2020-02-15 LAB — GLUCOSE, CAPILLARY
Glucose-Capillary: 100 mg/dL — ABNORMAL HIGH (ref 70–99)
Glucose-Capillary: 116 mg/dL — ABNORMAL HIGH (ref 70–99)
Glucose-Capillary: 126 mg/dL — ABNORMAL HIGH (ref 70–99)
Glucose-Capillary: 133 mg/dL — ABNORMAL HIGH (ref 70–99)
Glucose-Capillary: 180 mg/dL — ABNORMAL HIGH (ref 70–99)
Glucose-Capillary: 69 mg/dL — ABNORMAL LOW (ref 70–99)
Glucose-Capillary: 98 mg/dL (ref 70–99)

## 2020-02-15 LAB — CBC
HCT: 31.9 % — ABNORMAL LOW (ref 36.0–46.0)
Hemoglobin: 10.2 g/dL — ABNORMAL LOW (ref 12.0–15.0)
MCH: 26.5 pg (ref 26.0–34.0)
MCHC: 32 g/dL (ref 30.0–36.0)
MCV: 82.9 fL (ref 80.0–100.0)
Platelets: UNDETERMINED 10*3/uL (ref 150–400)
RBC: 3.85 MIL/uL — ABNORMAL LOW (ref 3.87–5.11)
RDW: 15.1 % (ref 11.5–15.5)
WBC: 12.5 10*3/uL — ABNORMAL HIGH (ref 4.0–10.5)
nRBC: 1.4 % — ABNORMAL HIGH (ref 0.0–0.2)

## 2020-02-15 LAB — BASIC METABOLIC PANEL
Anion gap: 11 (ref 5–15)
BUN: 18 mg/dL (ref 8–23)
CO2: 23 mmol/L (ref 22–32)
Calcium: 8.3 mg/dL — ABNORMAL LOW (ref 8.9–10.3)
Chloride: 101 mmol/L (ref 98–111)
Creatinine, Ser: 0.62 mg/dL (ref 0.44–1.00)
GFR, Estimated: >60 mL/min (ref >60)
Glucose, Bld: 100 mg/dL — ABNORMAL HIGH (ref 70–99)
Potassium: 3 mmol/L — ABNORMAL LOW (ref 3.5–5.1)
Sodium: 135 mmol/L (ref 135–145)

## 2020-02-15 LAB — MAGNESIUM: Magnesium: 2.3 mg/dL (ref 1.7–2.4)

## 2020-02-15 IMAGING — CT CT HEAD W/O CM
4 series · 16 of 47 positions shown, 18 images · non-contrast
Comparison: Three days ago

CLINICAL DATA: Headache.  Intracranial hemorrhage suspected

EXAM:
CT HEAD WITHOUT CONTRAST
TECHNIQUE: Contiguous axial images were obtained from the base of the skull
through the vertex without intravenous contrast.

[Series 3: head without · axial · non-contrast · 0.44mm/px · z∈[-103,+17]mm · 7 of 32 slices shown, 9 images]
[im 4/32  brain]
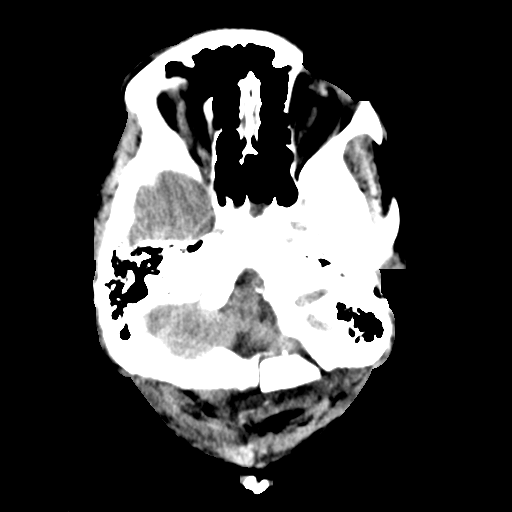
[im 4/32  bone]
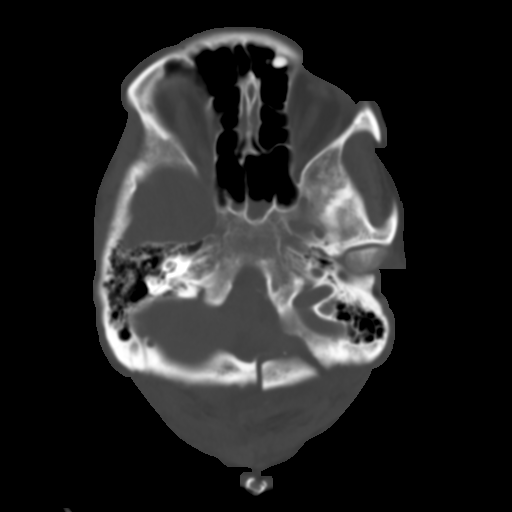
[im 8/32  brain]
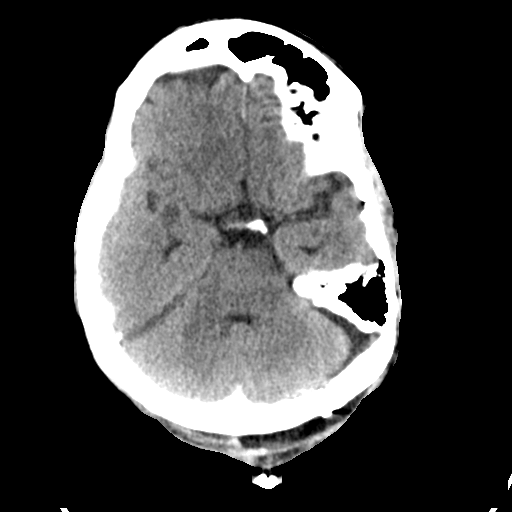
[im 12/32  brain]
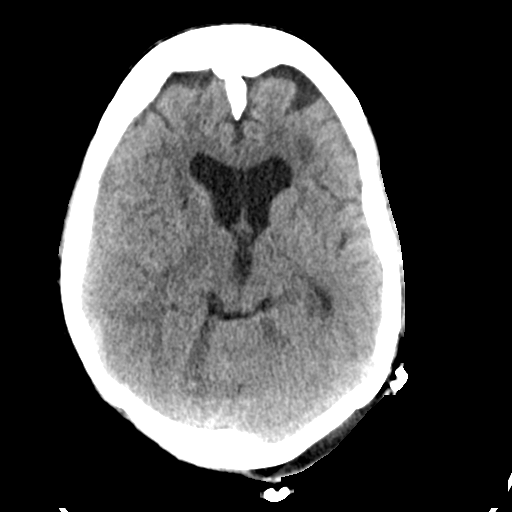
[im 16/32  brain]
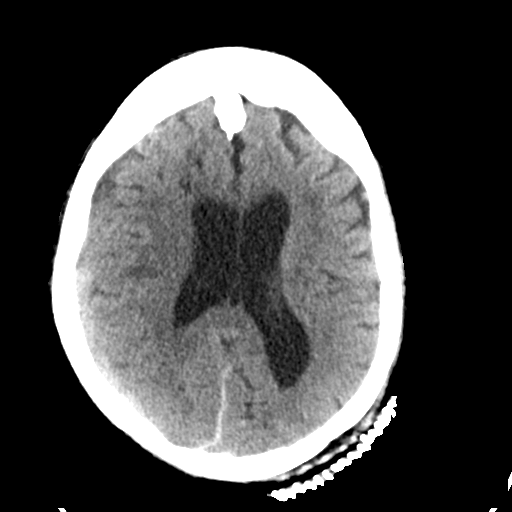
[im 20/32  brain]
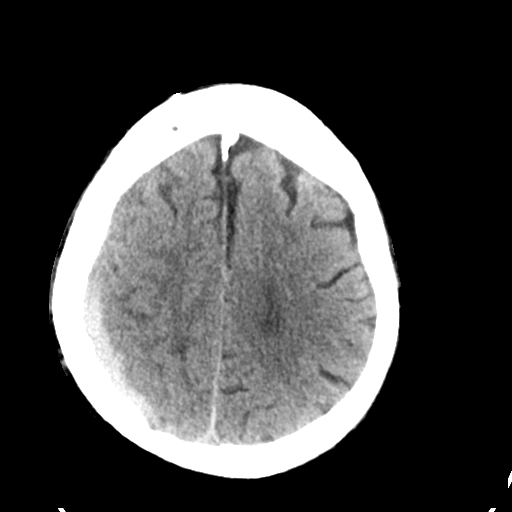
[im 20/32  bone]
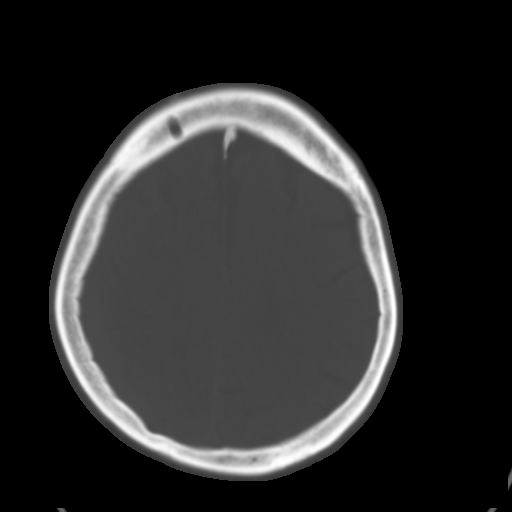
[im 24/32  brain]
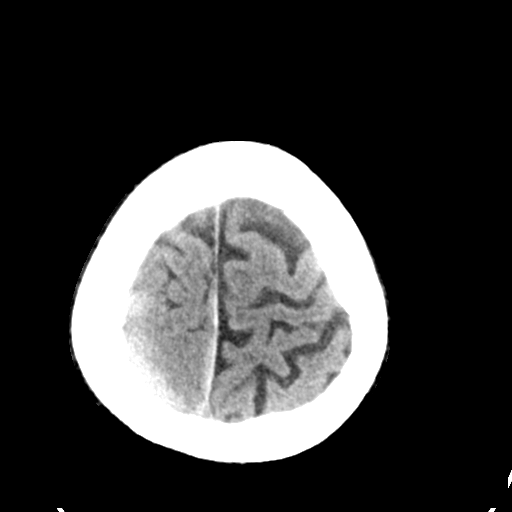
[im 28/32  brain]
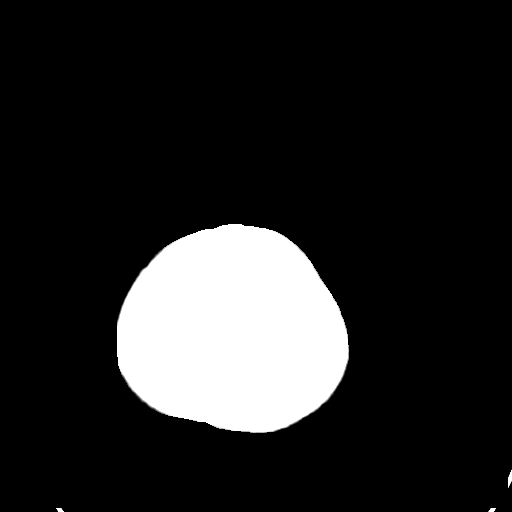

[Series 4: head bone · axial · 0.44mm/px · z∈[-102,-70]mm · 3 of 78 slices shown]
[im 8/78  bone]
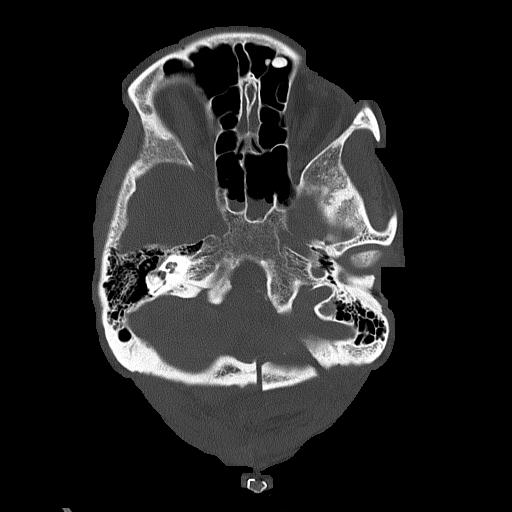
[im 16/78  bone]
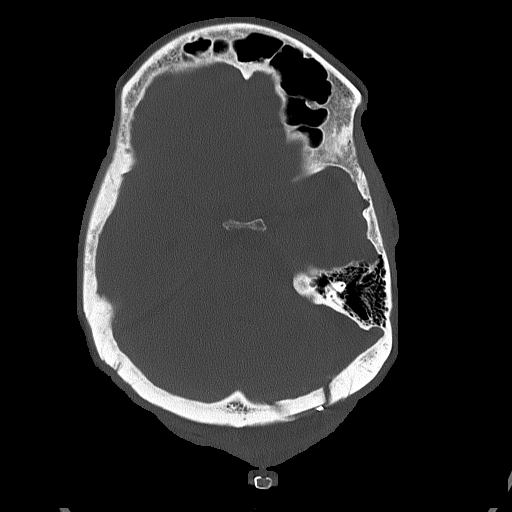
[im 24/78  bone]
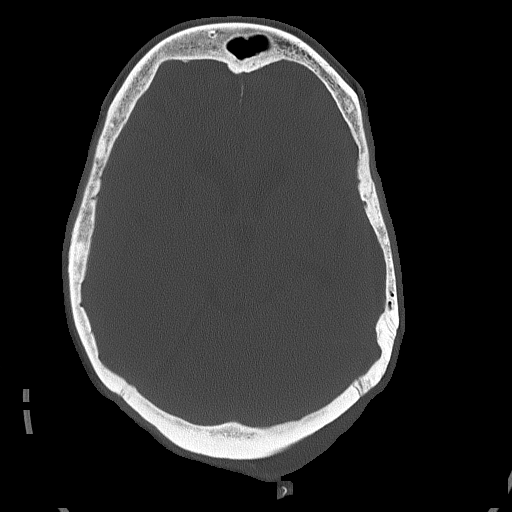

[Series 5: head without cor · coronal · non-contrast · 0.31mm/px · 3 of 71 slices shown]
[im 24/71  brain]
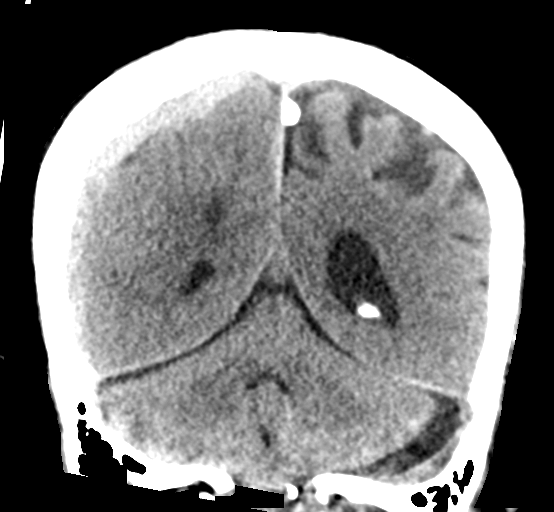
[im 32/71  brain]
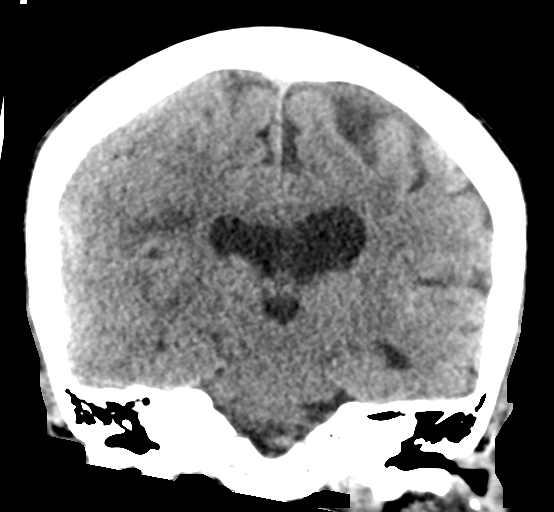
[im 39/71  brain]
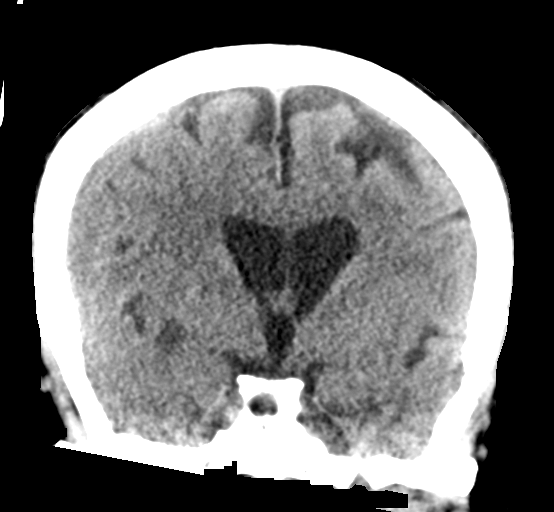

[Series 6: head without sag · sagittal · non-contrast · 0.31mm/px · 3 of 57 slices shown]
[im 20/57  brain]
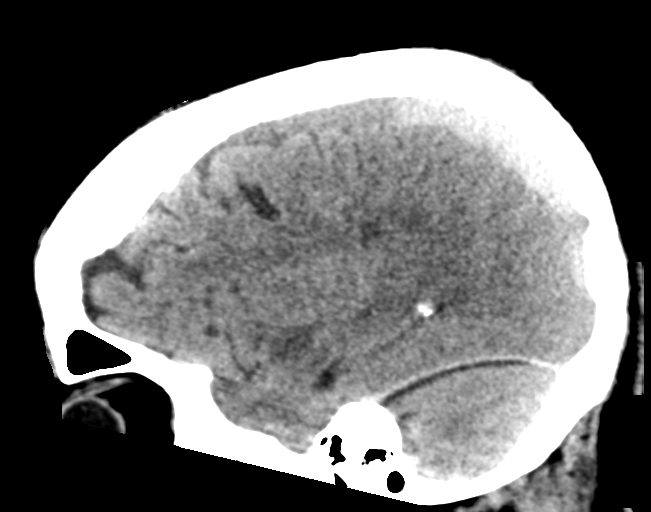
[im 29/57  brain]
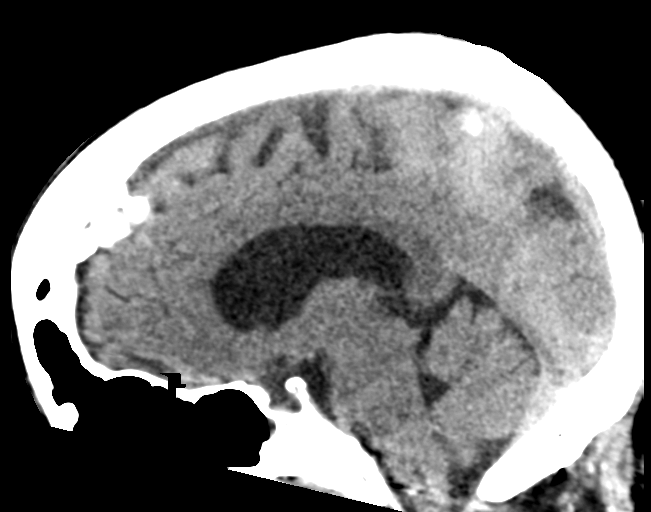
[im 38/57  brain]
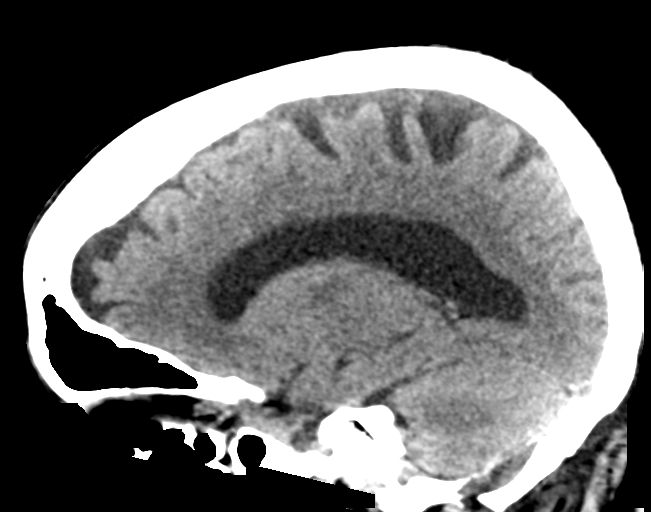

[16 of 47 positions shown; findings below may reference images not displayed]

FINDINGS: Brain: Subdural hemorrhage along the right cerebral convexity which
has not clearly increased from before, 7 mm in maximal thickness.
Intraventricular hemorrhage is no longer seen. The EVD has been
removed with stable ventricular volume. Small volume subarachnoid
hemorrhage along the left sylvian fissure primarily. No evidence of
recurrent hemorrhage. Remote right basal ganglia perforator infarct.

Vascular: Left PICA aneurysm clipping.

Skull: Unremarkable left suboccipital craniotomy. Fluid collection
superficial to the craniotomy and C1 ring resection is non
progressed.

Sinuses/Orbits: Left enucleation.
IMPRESSION: 1. No recurrent hemorrhage or other acute finding.
2. Decreasing subarachnoid/intraventricular hemorrhage.
3. Subdural hematoma on the right which appears slightly WACHTEL but
measures similar to before.
4. Stable ventricular volume after EVD removal.

## 2020-02-15 IMAGING — DX DG ABDOMEN 1V
1 series · 2 of 2 positions shown · non-contrast
Comparison: [DATE]

CLINICAL DATA: NG tube placement

EXAM:
ABDOMEN - 1 VIEW

[Series 1: abdomen · 0.14mm/px · 2 of 2 slices shown]
[im 1/2]
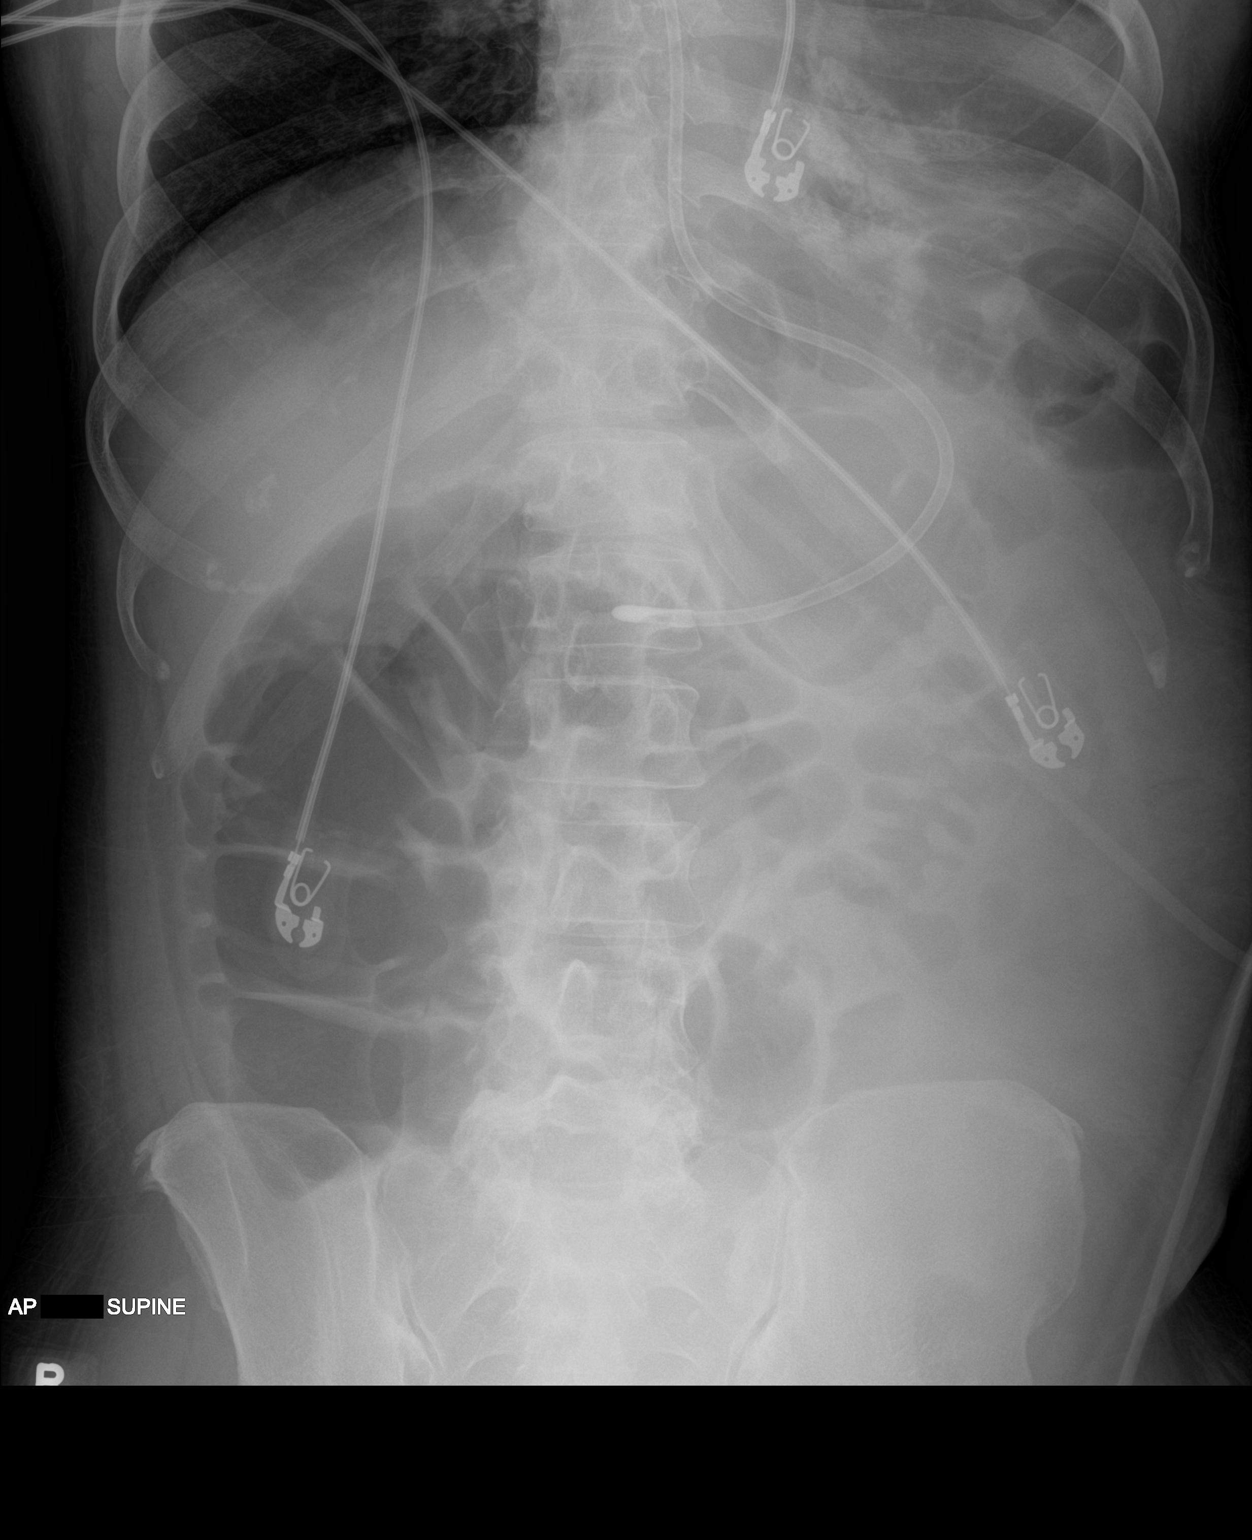
[im 2/2]
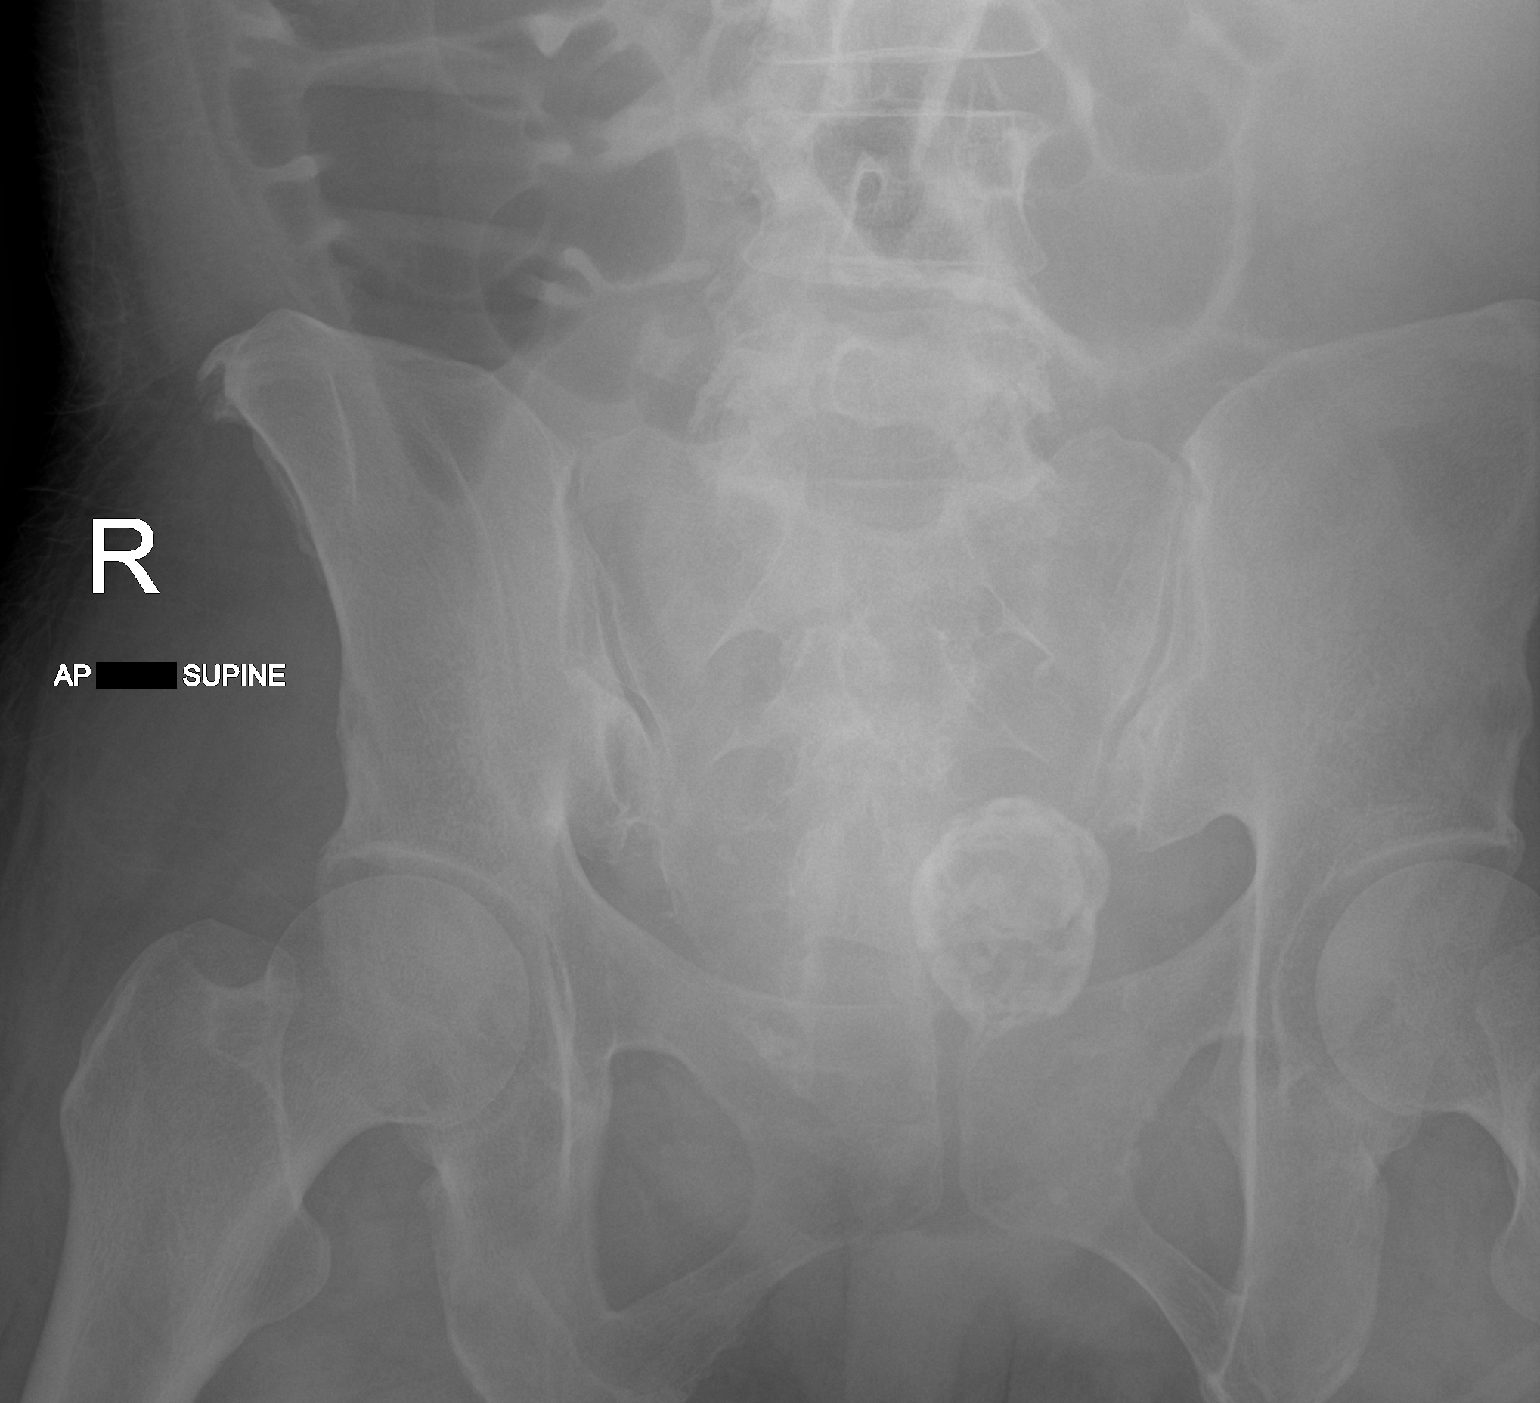

[2 of 2 positions shown; findings below may reference images not displayed]

FINDINGS: Feeding tube is in place with the tip in the mid to distal stomach.
Gaseous distention of the right colon. No evidence of bowel
obstruction. No organomegaly or free air.
IMPRESSION: Feeding tube tip in the mid to distal stomach.

## 2020-02-15 IMAGING — DX DG CHEST 1V PORT
1 series · 1 of 1 positions shown · non-contrast
Comparison: [DATE]

CLINICAL DATA: NG tube placement

EXAM:
PORTABLE CHEST 1 VIEW

[chest]
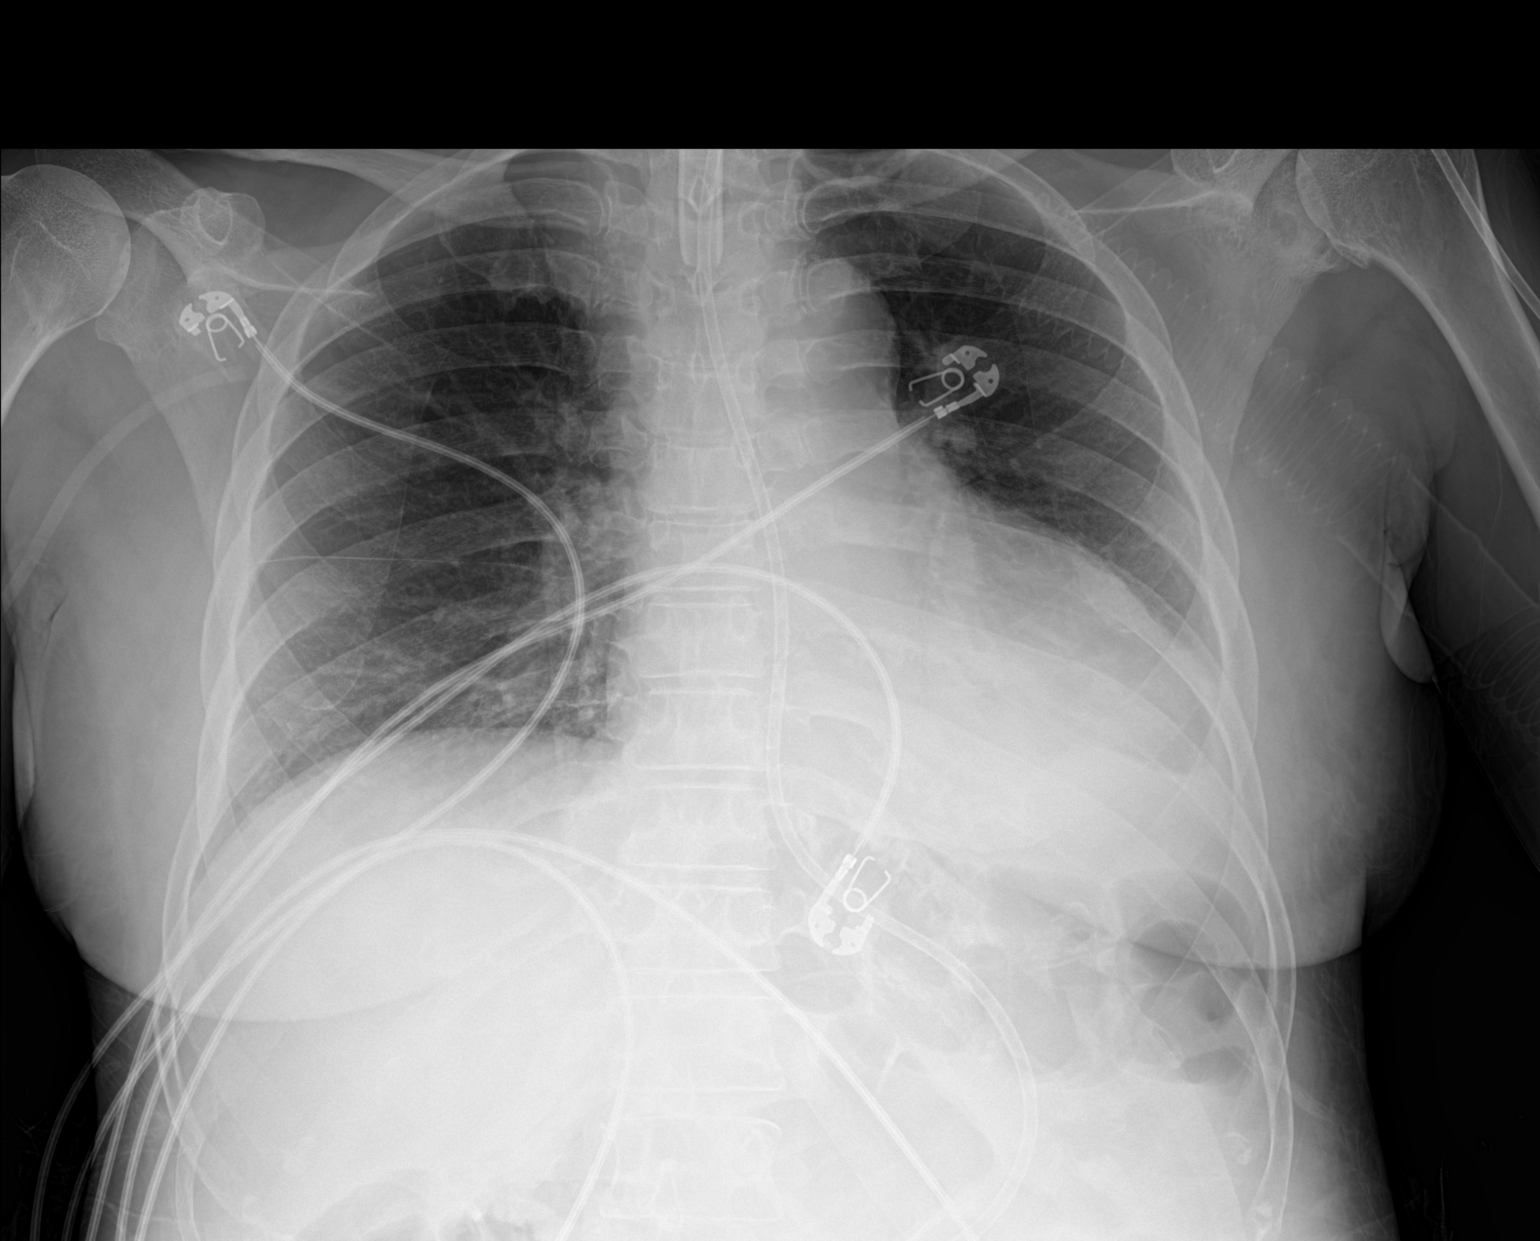

[1 of 1 positions shown; findings below may reference images not displayed]

FINDINGS: Tracheostomy tube and feeding tube remain in place, unchanged. Is
mild cardiomegaly. Increasing left basilar atelectasis or
infiltrate. Right lung clear. Trace left pleural effusion. No acute
bony abnormality.
IMPRESSION: Tracheostomy and feeding tube remain in place, unchanged.

Increasing left lower lobe atelectasis or infiltrate. Trace left
pleural effusion.

## 2020-02-15 MED ORDER — IBUPROFEN 100 MG/5ML PO SUSP
400.0000 mg | Freq: Once | ORAL | Status: AC
  Administered 2020-02-15: 400 mg
  Filled 2020-02-15: qty 20

## 2020-02-15 MED ORDER — POTASSIUM CHLORIDE 20 MEQ/15ML (10%) PO SOLN
40.0000 meq | ORAL | Status: AC
  Administered 2020-02-15 (×2): 40 meq
  Filled 2020-02-15 (×2): qty 30

## 2020-02-15 MED ORDER — POTASSIUM CHLORIDE 10 MEQ/100ML IV SOLN
10.0000 meq | INTRAVENOUS | Status: AC
  Administered 2020-02-15: 10 meq via INTRAVENOUS
  Filled 2020-02-15: qty 100

## 2020-02-15 MED ORDER — POTASSIUM CHLORIDE 10 MEQ/100ML IV SOLN
INTRAVENOUS | Status: AC
  Administered 2020-02-15: 10 meq via INTRAVENOUS
  Filled 2020-02-15: qty 100

## 2020-02-15 NOTE — Progress Notes (Signed)
Inpatient Rehab Admissions:  Inpatient Rehab Consult received.  I met with patient at the bedside for rehabilitation assessment and to discuss goals and expectations of an inpatient rehab admission.  She is able to answer yes/no questions accurately and write her daughter's phone number (correctly) on a piece of paper.  She is open to rehab and states that she should be able to have 24/7 support from her daughter at discharge.  Left voicemail to speak to her regarding CIR recommendations.  We also briefly discussed payor source and pt agreeable to being screened for medicaid.   Signed: Shann Medal, PT, DPT Admissions Coordinator [phone] 02/15/20  2:59 PM

## 2020-02-15 NOTE — Progress Notes (Addendum)
Contacted Elink @ [phone] re: increased trach secretions and distended abdomen pending call back, will contact rapid response nurse.

## 2020-02-15 NOTE — Progress Notes (Signed)
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  11/4, pt s/p L far-lateral craniotomy for clipping of PICA aneurysm. 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    PT Comments    Pt tolerated treatment well, performing multiple transfers with PT assistance. Pt continues to require physical assistance for all mobility, but is able to power up into standing with less assistance during this session. Pt demonstrates improved sitting balance and is less impulsive throughout session. Pt will benefit from continued acute PT POC to progress to more out of bed activity and to initiate gait training. PT continues to recommend CIR at this time as the pt is a great participant and appears highly motivated to make functional gains with continued aggressive mobilization and therapy services.   Follow Up Recommendations  CIR     Equipment Recommendations  Wheelchair (measurements PT);Wheelchair cushion (measurements PT) (mechanical lift, if home today)    Recommendations for Other Services       Precautions / Restrictions Precautions Precautions: Fall Precaution Comments: trach Restrictions Weight Bearing Restrictions: No    Mobility  Bed Mobility Overal bed mobility: Needs Assistance Bed Mobility: Supine to Sit;Sit to Supine     Supine to sit: Min assist;HOB elevated Sit to supine: Mod assist   General bed mobility comments: minA to pull trunk into sitting with bilateral hand hold, modA to manage LEs to return to supine  Transfers Overall transfer level: Needs assistance Equipment used: 1 person hand held assist Transfers: Sit  to/from Stand Sit to Stand: Mod assist;From elevated surface         General transfer comment: 4 sit to stands with unilateral knee block and BUE support, pt with increased time and verbal cues to extend hips  Ambulation/Gait                 Stairs             Wheelchair Mobility    Modified Rankin (Stroke Patients Only) Modified Rankin (Stroke Patients Only) Pre-Morbid Rankin Score: No symptoms Modified Rankin: Severe disability     Balance Overall balance assessment: Needs assistance Sitting-balance support: Single extremity supported;Feet supported Sitting balance-Leahy Scale: Poor Sitting balance - Comments: reliant on UE support or minA   Standing balance support: Bilateral upper extremity supported Standing balance-Leahy Scale: Poor Standing balance comment: modA with BUE support of PT hand hold                            Cognition Arousal/Alertness: Awake/alert Behavior During Therapy: WFL for tasks assessed/performed Overall Cognitive Status: Impaired/Different from baseline Area of Impairment: Attention;Following commands;Safety/judgement;Problem solving                   Current Attention Level: Sustained Memory: Decreased recall of precautions Following Commands: Follows one step commands consistently Safety/Judgement: Decreased awareness of safety;Decreased awareness of deficits   Problem Solving: Requires verbal cues;Requires tactile cues        Exercises General Exercises - Upper Extremity Shoulder Flexion: AROM;AAROM;Both;10 reps Elbow Flexion: AAROM;Both;5 reps;Supine Elbow Extension: AAROM;Both;5 reps;Supine    General Comments General comments (skin integrity, edema, etc.): VSS on 5L 21%  FiO2 trach collar      Pertinent Vitals/Pain Pain Assessment: No/denies pain    Home Living                      Prior Function            PT Goals (current goals can now be found in the care plan section)  Acute Rehab PT Goals Patient Stated Goal: none stated agreeable to working with therapies PT Goal Formulation: With patient Time For Goal Achievement: 02/29/20 Potential to Achieve Goals: Fair Progress towards PT goals: Progressing toward goals    Frequency    Min 4X/week      PT Plan Current plan remains appropriate    Co-evaluation              AM-PAC PT "6 Clicks" Mobility   Outcome Measure  Help needed turning from your back to your side while in a flat bed without using bedrails?: A Lot Help needed moving from lying on your back to sitting on the side of a flat bed without using bedrails?: A Lot Help needed moving to and from a bed to a chair (including a wheelchair)?: Total Help needed standing up from a chair using your arms (e.g., wheelchair or bedside chair)?: A Lot Help needed to walk in hospital room?: Total Help needed climbing 3-5 steps with a railing? : Total 6 Click Score: 9    End of Session Equipment Utilized During Treatment: Oxygen Activity Tolerance: Patient tolerated treatment well Patient left: in bed;with call bell/phone within reach;with bed alarm set Nurse Communication: Mobility status;Need for lift equipment PT Visit Diagnosis: Hemiplegia and hemiparesis;Other symptoms and signs involving the nervous system (R29.898);Other abnormalities of gait and mobility (R26.89) Hemiplegia - Right/Left: Left Hemiplegia - dominant/non-dominant: Non-dominant Hemiplegia - caused by: Other Nontraumatic intracranial hemorrhage     Time: 1357-1416 PT Time Calculation (min) (ACUTE ONLY): 19 min  Charges:  $Therapeutic Activity: 8-22 mins                     Zenaida Niece, PT, DPT Acute Rehabilitation Pager: [phone]    Zenaida Niece 02/15/2020, 2:58 PM

## 2020-02-15 NOTE — Progress Notes (Signed)
[facility] Progress Note Patient Name: Stacy Moran DOB: [birth year] MRN: [id number]   Date of Service  02/15/2020  HPI/Events of Note  K+ 3.0  eICU Interventions  E-Link adult electrolyte replacement protocol for K+ ordered.        Kerry Kass Juris Gosnell 02/15/2020, 6:13 AM

## 2020-02-15 NOTE — Progress Notes (Signed)
Rapid Response nurse into see patient.

## 2020-02-15 NOTE — Progress Notes (Signed)
  NEUROSURGERY PROGRESS NOTE   HA overnight. CT ordered. No new findings. HA now improved. Patient without concerns this am  EXAM:  BP 115/67   Pulse 82   Temp 97.8 F (36.6 C) (Oral)   Resp (!) 28   Ht 5\' 9"  (1.753 m)   Wt 70 kg   SpO2 96%   BMI 22.79 kg/m   Awake, alert Trach Mouths responses,Nods head appropriately to questions Briskly follows commands normal strength in BUE/BLE CN grossly intact. Wound c/d/i  IMPRESSION/PLAN 64 y.o. female POD# 66 SAHd# 16s/p clipping Left PICA.POD#5s/p thyroidectomy/trach doing well. Neurologically stable. HA expected. tracheobronchitis - Cont Nimotop - CIR candidate. Consult placed - HA are expected from her ruptured aneurysm.  They will fluctuate in severity and intensity on a day to day basis. Use prn meds as needed. No reason for repeat head CT unless exam changes.

## 2020-02-15 NOTE — Significant Event (Signed)
Rapid Response Event Note   Reason for Call :  Concern for pt having aspirated on tube feeds.  Initial Focused Assessment:  Pt lying in bed, awake/alert. Lung sounds are rhonchus. Pt breathing is regular, unlabored. Skin is warm, dry. Pt smiling, in no distress. Abdomen is round, distended. Bowel sounds are faint. Tracheal suction performed: tan, thick/frothy sputum suctioned out.  VS: T 97.76F, BP 139/80, HR 100, RR 18, SpO2 94% on room air  Interventions:  -KUB -CXR  Plan of Care:  -Consider IVF with dextrose if TF remain off -Monitor pt for increased work of breathing, notify if supplemental oxygen needs increases -Aspiration precautions  Call rapid response for additional needs.  Event Summary:  MD Notified: R. Desai Call Time: [place] Time: 0301 End Time: [place], RN

## 2020-02-15 NOTE — Progress Notes (Addendum)
Paged provider re: secretions from patient's trach and distended abdomen, advised charge nurse of change.

## 2020-02-15 NOTE — Progress Notes (Signed)
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  11/4, pt s/p L far-lateral craniotomy for clipping of PICA aneurysm. 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   OT comments  Pt. Seen for skilled OT treatment session.  Pt. Able to complete washing face and oral care with min. Cueing or instructions.  Guided through b ue exercises.  Educated to complete throughout the day.  Pt. Smiling at end of session.  Notably happy with her progress and what she was able to complete today.  Answering questions appropriately and even signaled "thumbs up" to show she had understood me when I instructed not to swallow water after she swished during oral care.  Will continue to progress adls next session.   Follow Up Recommendations  CIR    Equipment Recommendations  Wheelchair (measurements OT);Wheelchair cushion (measurements OT);Hospital bed    Recommendations for Other Services Rehab consult    Precautions / Restrictions Precautions Precautions: Fall Precaution Comments: trach       Mobility Bed Mobility                  Transfers                      Balance                                           ADL either performed or assessed with clinical judgement   ADL Overall ADL's : Needs assistance/impaired     Grooming: Wash/dry face;Oral care Grooming Details (indicate cue type and reason): pt. following instructions and actively engaging in task. able to brush teeth with out inst. cued when swishing not to swallow and pt. gave thumbs up and then spit appropriately after swishing                                General ADL Comments: pt. able to complete washing face and oral care with set up.  reminded of not swallowing water she was swishing with and she gave thumbs up. smiling and notably pleased with ability to particpate in these tasks     Vision       Perception     Praxis      Cognition                                                Exercises General Exercises - Upper Extremity Shoulder Flexion: AROM;AAROM;Both;10 reps Elbow Flexion: AAROM;Both;5 reps;Supine Elbow Extension: AAROM;Both;5 reps;Supine   Shoulder Instructions       General Comments      Pertinent Vitals/ Pain          Home Living  Prior Functioning/Environment              Frequency  Min 2X/week        Progress Toward Goals  OT Goals(current goals can now be found in the care plan section)  Progress towards OT goals: Progressing toward goals     Plan      Co-evaluation                 AM-PAC OT "6 Clicks" Daily Activity     Outcome Measure   Help from another person eating meals?: Total Help from another person taking care of personal grooming?: A Lot Help from another person toileting, which includes using toliet, bedpan, or urinal?: A Lot Help from another person bathing (including washing, rinsing, drying)?: A Lot Help from another person to put on and taking off regular upper body clothing?: A Lot Help from another person to put on and taking off regular lower body clothing?: A Lot 6 Click Score: 11    End of Session Equipment Utilized During Treatment: Oxygen  OT Visit Diagnosis: Unsteadiness on feet (R26.81);Muscle weakness (generalized) (M62.81);Other abnormalities of gait and mobility (R26.89) Hemiplegia - Right/Left: Left Hemiplegia - dominant/non-dominant: Non-Dominant Hemiplegia - caused by: Nontraumatic intracerebral hemorrhage   Activity Tolerance Patient  tolerated treatment well   Patient Left in bed;with call bell/phone within reach;with bed alarm set   Nurse Communication Other (comment) (updated rn on which grooming tasks had been completed)        Time: 2518-9842 OT Time Calculation (min): 15 min  Charges: OT General Charges $OT Visit: 1 Visit OT Treatments $Self Care/Home Management : 8-22 mins  Sonia Baller, COTA/L Acute Rehabilitation [phone]   Janice Coffin 02/15/2020, 12:49 PM

## 2020-02-15 NOTE — Consult Note (Signed)
Physical Medicine and Rehabilitation Consult Reason for Consult: Sudden onset of headache with decreased functional mobility Referring Physician: Dr. Kathyrn Sheriff  HPI: Stacy Moran is a 64 y.o. right-handed female with unremarkable past medical history on no prescription medications.  History taken from chart review due to nonverbal.  She presented on 01/30/2020 with acute headache progressively worsening with obtundation requiring intubation in the ED for airway protection.  Noted blood pressure 262/123 .  Admission chemistries unremarkable except potassium 3.4, glucose 178, hemoglobin A1c 6.2, alcohol negative, urine drug screen negative.  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  Patient underwent right frontal ventriculostomy on 02/26/2020 per Dr. Kathyrn Sheriff.  She then underwent diagnostic cerebral angiogram for further delineating the left PICA aneurysm.  It was noted not to be amenable to endovascular treatment and underwent suboccipital craniotomy for clipping of PICA aneurysm 01/31/2020.  Ultrasound soft tissue head and neck showed probable right thyroid nodule 2.3 x 2.3 x 2.4 cm.  Hospital course further complicated by prolonged vent support requiring tracheostomy as well as thyroidectomy on 02/08/2020 per Dr. Constance Holster and currently has a #6 cuffless flexible Shiley tracheostomy tube in place.  Patient currently remains n.p.o. with alternative means of nutritional support.  Repeat head CT on 11/19 personally reviewed-improving. She was cleared to begin subcutaneous heparin for DVT prophylaxis.  Patient is completing a 21-day course of Nimotop with close monitoring of blood pressure.  Therapy evaluations  completed with recommendations of physical medicine rehab consult.   Review of Systems  Unable to perform ROS: Other  Trach without PMV  History reviewed. No pertinent past medical history.  Unable to obtain from patient. Past Surgical History:  Procedure Laterality Date  . CRANIOTOMY Left 01/30/2020   Procedure: LEFT FAR LATERAL CRANIOTOMY FOR ANEURYSM CLIPPING;  Surgeon: [name], MD;  Location: [place];  Service: Neurosurgery;  Laterality: Left;  . IR ANGIO INTRA EXTRACRAN SEL INTERNAL CAROTID BILAT MOD SED  01/30/2020  . IR ANGIO VERTEBRAL SEL VERTEBRAL UNI L MOD SED  01/30/2020  . RADIOLOGY WITH ANESTHESIA N/A 01/30/2020   Procedure: IR WITH ANESTHESIA;  Surgeon: [name], MD;  Location: [place];  Service: Radiology;  Laterality: N/A;  . THYROIDECTOMY N/A 02/08/2020   Procedure: THYROIDECTOMY;  Surgeon: [name], MD;  Location: [place];  Service: ENT;  Laterality: N/A;  . TRACHEOSTOMY TUBE PLACEMENT N/A 02/08/2020   Procedure: TRACHEOSTOMY;  Surgeon: [name], MD;  Location: [place];  Service: ENT;  Laterality: N/A;   History reviewed. No pertinent family history.  Unable to obtain from patient. Social History:  has no history on file for tobacco use, alcohol use, and drug use.  Unable to obtain from patient. Allergies: No Known Allergies No medications prior to admission.    Home: Home Living Family/patient expects to be discharged to:: Unsure Additional Comments: No family present to get info.  Pt unable.  Functional History: Prior Function Comments: No family present to gather information, pt unable to participate, no information in the chart. Functional Status:  Mobility: Bed Mobility Overal bed mobility: Needs Assistance Bed Mobility: Rolling, Sidelying to Sit, Sit to Sidelying Rolling: Mod assist, +2 for physical assistance Sidelying to sit: Mod assist, +2 for physical assistance, +2 for  safety/equipment Supine to sit: +2 for physical assistance,  Total assist Sit to supine: +2 for physical assistance, Mod assist, +2 for safety/equipment General bed mobility comments: Rolling both sides with assist to position extremeties to assist, required increased assist to roll R compared to L;  For supine/sidelying/sit: requiring assist for trunk and L LE, cues for sequencing and assist to control trunk Transfers Overall transfer level: Needs assistance Equipment used: 2 person hand held assist Transfers: Sit to/from Stand Sit to Stand: Max assist, +2 physical assistance General transfer comment: did not attempt - pt impulsive and not following commands as well today, unsafe to stand Ambulation/Gait General Gait Details: NT    ADL: ADL Overall ADL's : Needs assistance/impaired Lower Body Dressing: Maximal assistance, +2 for physical assistance, +2 for safety/equipment, Sit to/from stand Lower Body Dressing Details (indicate cue type and reason): pt reaching towards socks to pull higher up on her feet Functional mobility during ADLs: Maximal assistance, +2 for physical assistance, +2 for safety/equipment General ADL Comments: max-totalA currently  Cognition: Cognition Overall Cognitive Status: Impaired/Different from baseline Orientation Level: Oriented to person Cognition Arousal/Alertness: Awake/alert Behavior During Therapy: Impulsive Overall Cognitive Status: Impaired/Different from baseline Area of Impairment: Orientation, Attention, Memory, Following commands, Safety/judgement, Problem solving, Awareness Orientation Level: Disoriented to, Time, Situation Current Attention Level: Sustained Memory: Decreased short-term memory Following Commands: Follows one step commands inconsistently Safety/Judgement: Decreased awareness of deficits, Decreased awareness of safety Awareness: Intellectual Problem Solving: Requires verbal cues, Requires tactile cues, Difficulty sequencing General Comments: Pt did not want to communicate with  writing today.  She continues to move quickly and impulsively at times.  Required increased cues for transfers and decreased consistency with commands. Difficult to assess due to: Tracheostomy  Blood pressure (!) 163/84, pulse 86, temperature 98.4 F (36.9 C), temperature source Oral, resp. rate (!) 21, height 5\' 9"  (1.753 m), weight 59.7 kg, SpO2 94 %. Physical Exam Vitals reviewed.  Constitutional:      General: She is not in acute distress. HENT:     Head: Normocephalic.     Comments: + NG. Surgical site CDI    Right Ear: External ear normal.     Left Ear: External ear normal.     Nose: Nose normal.  Eyes:     General:        Right eye: No discharge.        Left eye: No discharge.     Comments: No muscle movement noted in left eye with eyelid retraction  Neck:     Comments: + Trach with thick secretions Cardiovascular:     Rate and Rhythm: Normal rate and regular rhythm.  Pulmonary:     Effort: Pulmonary effort is normal.     Breath sounds: Rhonchi present.  Abdominal:     General: There is distension.     Comments: Bowel sounds slowed.  Musculoskeletal:     Comments: No edema or tenderness in extremities  Skin:    General: Skin is warm and dry.  Neurological:     Mental Status: She is alert.     Comments: Alert Follows simple commands. Motor:?  4+/5 throughout  Psychiatric:     Comments: Unable to assess due to inability to speak     Results for orders placed or performed during the hospital encounter of 01/30/20 (from the past 24 hour(s))  Glucose, capillary     Status: Abnormal   Collection Time: 02/14/20  5:55 AM  Result Value Ref Range   Glucose-Capillary  101 (H) 70 - 99 mg/dL  Glucose, capillary     Status: None   Collection Time: 02/14/20  8:19 AM  Result Value Ref Range   Glucose-Capillary 71 70 - 99 mg/dL  Glucose, capillary     Status: Abnormal   Collection Time: 02/14/20 11:54 AM  Result Value Ref Range   Glucose-Capillary 58 (L) 70 - 99 mg/dL   Glucose, capillary     Status: None   Collection Time: 02/14/20  4:19 PM  Result Value Ref Range   Glucose-Capillary 85 70 - 99 mg/dL  Glucose, capillary     Status: Abnormal   Collection Time: 02/14/20  8:09 PM  Result Value Ref Range   Glucose-Capillary 127 (H) 70 - 99 mg/dL  Glucose, capillary     Status: Abnormal   Collection Time: 02/14/20 11:04 PM  Result Value Ref Range   Glucose-Capillary 125 (H) 70 - 99 mg/dL  Glucose, capillary     Status: None   Collection Time: 02/15/20  4:03 AM  Result Value Ref Range   Glucose-Capillary 98 70 - 99 mg/dL   Comment 1 Notify RN    Comment 2 Document in Chart    CT HEAD WO CONTRAST  Result Date: 02/15/2020 CLINICAL DATA:  Headache.  Intracranial hemorrhage suspected EXAM: CT HEAD WITHOUT CONTRAST TECHNIQUE: Contiguous axial images were obtained from the base of the skull through the vertex without intravenous contrast. COMPARISON:  Three days ago FINDINGS: Brain: Subdural hemorrhage along the right cerebral convexity which has not clearly increased from before, 7 mm in maximal thickness. Intraventricular hemorrhage is no longer seen. The EVD has been removed with stable ventricular volume. Small volume subarachnoid hemorrhage along the left sylvian fissure primarily. No evidence of recurrent hemorrhage. Remote right basal ganglia perforator infarct. Vascular: Left PICA aneurysm clipping. Skull: Unremarkable left suboccipital craniotomy. Fluid collection superficial to the craniotomy and C1 ring resection is non progressed. Sinuses/Orbits: Left enucleation. IMPRESSION: 1. No recurrent hemorrhage or other acute finding. 2. Decreasing subarachnoid/intraventricular hemorrhage. 3. Subdural hematoma on the right which appears slightly fuller but measures similar to before. 4. Stable ventricular volume after EVD removal. Electronically Signed   By: Monte Fantasia M.D.   On: 02/15/2020 04:56   DG Chest Port 1 View  Result Date: 02/14/2020 CLINICAL  DATA:  Follow-up examination for acute respiratory failure. EXAM: PORTABLE CHEST 1 VIEW COMPARISON:  Prior radiograph from 02/10/2020. FINDINGS: Tracheostomy tube in place overlying the upper airway, tip well above the carina. Dobbhoff feeding tube in place with tip overlying the stomach. Cardiomegaly, stable. Mediastinal silhouette within normal limits. Lungs well inflated. Mild streaky right basilar opacity, most consistent with atelectasis. No other focal airspace disease. No pulmonary edema or visible pleural effusion. No pneumothorax. No acute osseous finding. IMPRESSION: 1. Support apparatus in satisfactory position. 2. Mild right basilar subsegmental atelectasis. No other active cardiopulmonary disease. Electronically Signed   By: Jeannine Boga M.D.   On: 02/14/2020 02:49    Assessment/Plan: Diagnosis: SAH Stroke: Continue secondary stroke prophylaxis and Risk Factor Modification listed below:   Blood Pressure Management:  Continue current medication with prn's with permisive HTN per primary team Prediabetes management:   Labs and imaging (see above) independently reviewed.  Records reviewed and summated above.  1. Does the need for close, 24 hr/day medical supervision in concert with the patient's rehab needs make it unreasonable for this patient to be served in a less intensive setting? Yes 2. Co-Morbidities requiring supervision/potential complications: tachypnea (monitor RR and O2  Sats with increased physical exertion), prediabetes (Monitor in accordance with exercise and adjust meds as necessary), hypokalemia (continue to monitor and replete as necessary), leukocytosis (repeat labs, cont to monitor for signs and symptoms of infection, further workup if indicated),?  Ileus (previous KUB personally reviewed, consider repeat-hold tube feeds and allow for bowel rest, aggressively replete potassium) 3. Due to bladder management, bowel management, safety, skin/wound care, disease  management, medication administration, pain management and patient education, does the patient require 24 hr/day rehab nursing? Yes 4. Does the patient require coordinated care of a physician, rehab nurse, therapy disciplines of PT/OT/SLP to address physical and functional deficits in the context of the above medical diagnosis(es)? Yes Addressing deficits in the following areas: balance, endurance, locomotion, transferring, bowel/bladder control, bathing, dressing, toileting, cognition, speech, swallowing and psychosocial support 5. Can the patient actively participate in an intensive therapy program of at least 3 hrs of therapy per day at least 5 days per week? Yes 6. The potential for patient to make measurable gains while on inpatient rehab is excellent 7. Anticipated functional outcomes upon discharge from inpatient rehab are supervision and min assist  with PT, supervision and min assist with OT, supervision and min assist with SLP. 8. Estimated rehab length of stay to reach the above functional goals is: 22-27 days days. 9. Anticipated discharge destination: TBD 10. Overall Rehab/Functional Prognosis: good  RECOMMENDATIONS: This patient's condition is appropriate for continued rehabilitative care in the following setting: CIR caregiver support available upon discharge. Patient has agreed to participate in recommended program. Potentially Note that insurance prior authorization may be required for reimbursement for recommended care.  Comment: Rehab Admissions Coordinator to follow up.  I have personally performed a face to face diagnostic evaluation, including, but not limited to relevant history and physical exam findings, of this patient and developed relevant assessment and plan.  Additionally, I have reviewed and concur with the physician assistant's documentation above.   Delice Lesch, MD, ABPMR [name] Angiulli, PA-C 02/15/2020

## 2020-02-15 NOTE — Progress Notes (Signed)
[facility] Progress Note Patient Name: Stacy Moran DOB: 07/09/1955 MRN: [id number]   Date of Service  02/15/2020  HPI/Events of Note  Patient with 5/10 headache that did not fully resolve with a Tylenol dose, per bedside RN patient is alert and interactive, no focal findings.  eICU Interventions  Will give 400 mg of ibuprofen x 1, if headache does not resolve will CT patient's head.        Kerry Kass Youcef Klas 02/15/2020, 2:18 AM

## 2020-02-15 NOTE — Progress Notes (Signed)
[place] Progress Note Patient Name: Stacy Moran DOB: 1955-08-06 MRN: [id number]   Date of Service  02/15/2020  HPI/Events of Note  Patient with persistent headache 5/10 despite Tylenol and Ibuprofen, it is likely residua from her [facility] but I will order a non-contrast head CT to make sure patient has not had a new hemorrhage.  eICU Interventions  Non contrast head CT ordered.        Kerry Kass Maizie Garno 02/15/2020, 4:04 AM

## 2020-02-16 LAB — GLUCOSE, CAPILLARY
Glucose-Capillary: 91 mg/dL (ref 70–99)
Glucose-Capillary: 90 mg/dL (ref 70–99)
Glucose-Capillary: 55 mg/dL — ABNORMAL LOW (ref 70–99)
Glucose-Capillary: 65 mg/dL — ABNORMAL LOW (ref 70–99)
Glucose-Capillary: 74 mg/dL (ref 70–99)

## 2020-02-16 MED ORDER — HALOPERIDOL LACTATE 5 MG/ML IJ SOLN
0.5000 mg | Freq: Four times a day (QID) | INTRAMUSCULAR | Status: DC | PRN
  Administered 2020-02-16 – 2020-02-20 (×7): 1 mg via INTRAVENOUS
  Administered 2020-02-20: 0.5 mg via INTRAVENOUS
  Administered 2020-02-20 – 2020-03-09 (×9): 1 mg via INTRAVENOUS
  Filled 2020-02-16 (×16): qty 1

## 2020-02-16 MED ORDER — LABETALOL HCL 5 MG/ML IV SOLN
10.0000 mg | Freq: Four times a day (QID) | INTRAVENOUS | Status: DC
  Administered 2020-02-16 – 2020-02-18 (×8): 10 mg via INTRAVENOUS
  Filled 2020-02-16 (×7): qty 4

## 2020-02-16 MED ORDER — DEXTROSE 50 % IV SOLN
INTRAVENOUS | Status: AC
  Administered 2020-02-16: 50 mL
  Filled 2020-02-16: qty 50

## 2020-02-16 MED ORDER — LABETALOL HCL 200 MG PO TABS
300.0000 mg | ORAL_TABLET | Freq: Three times a day (TID) | ORAL | Status: DC
  Administered 2020-02-23 – 2020-02-24 (×3): 300 mg
  Filled 2020-02-16 (×3): qty 1

## 2020-02-16 NOTE — Evaluation (Signed)
Clinical/Bedside Swallow Evaluation Patient Details  Name: Stacy Moran MRN: [id number] Date of Birth: 09/02/1955  Today's Date: 02/16/2020 Time: SLP Start Time (ACUTE ONLY): 1400 SLP Stop Time (ACUTE ONLY): 1420 SLP Time Calculation (min) (ACUTE ONLY): 20 min  Past Medical History: History reviewed. No pertinent past medical history. Past Surgical History:  Past Surgical History:  Procedure Laterality Date  . CRANIOTOMY Left 01/30/2020   Procedure: LEFT FAR LATERAL CRANIOTOMY FOR ANEURYSM CLIPPING;  Surgeon: [name], MD;  Location: [place];  Service: Neurosurgery;  Laterality: Left;  . IR ANGIO INTRA EXTRACRAN SEL INTERNAL CAROTID BILAT MOD SED  01/30/2020  . IR ANGIO VERTEBRAL SEL VERTEBRAL UNI L MOD SED  01/30/2020  . RADIOLOGY WITH ANESTHESIA N/A 01/30/2020   Procedure: IR WITH ANESTHESIA;  Surgeon: [name], MD;  Location: [place];  Service: Radiology;  Laterality: N/A;  . THYROIDECTOMY N/A 02/08/2020   Procedure: THYROIDECTOMY;  Surgeon: [name], MD;  Location: [place];  Service: ENT;  Laterality: N/A;  . TRACHEOSTOMY TUBE PLACEMENT N/A 02/08/2020   Procedure: TRACHEOSTOMY;  Surgeon: [name], MD;  Location: [place];  Service: ENT;  Laterality: N/A;   HPI:  64 y/o presented with large subarachnoid hemorrhage due to ruptured PICA with resultant hydrocephalus. Admitted on 11/3, [place] on 11/4, vented until 11/12 when pt was trached. Also found to have multinodular goiter complicating respiratory failure so total thyroidectomy also completed on 11/12. Drains removed on 11/14. ATC on 11/15. Changed to 6 cuffledd on 11/17.  There is question of left lingual deviation in notes.    Assessment / Plan / Recommendation Clinical Impression  Swallow eval completed in context of pt now with no means of nutrition or way to administer meds as her Cortrak is out. Pt alert, able to follow commands, but still unsuccessful with PMSV (please see next note regarding PMSV trial). Pts  secretions are tan, runny and moderate to severe in quantity, at times with brown chunks pouring out of trach. She is noted to have left XII lingual weakness and deviation. With one ice chip trial and one sip of water pt observed to have multiple swallows, with subjectively restricted hyoid/laryngeal movement, and water observed to spill out of trach hub and below pts stoma. There is obviously a severe oropharyngeal dysphagia. Severity of secretions likely also impacting swallow function. MBS and possibly FEES are warranted to futher evaluate pts swallowing with direct visualization. Called neurosurgeon on call and requested CCM consult and hospitalist management of this pt with multiple barriers to progression.  SLP Visit Diagnosis: Dysphagia, oropharyngeal phase (R13.12)    Aspiration Risk  Severe aspiration risk    Diet Recommendation     Medication Administration: Via alternative means    Other  Recommendations     Follow up Recommendations Inpatient Rehab      Frequency and Duration min 2x/week  2 weeks       Prognosis        Swallow Study   General HPI: 64 y/o presented with large subarachnoid hemorrhage due to ruptured PICA with resultant hydrocephalus. Admitted on 11/3, [place] on 11/4, vented until 11/12 when pt was trached. Also found to have multinodular goiter complicating respiratory failure so total thyroidectomy also completed on 11/12. Drains removed on 11/14. ATC on 11/15. Changed to 6 cuffledd on 11/17.  There is question of left lingual deviation in notes.  Type of Study: Bedside Swallow Evaluation Diet Prior to this Study: NPO Temperature Spikes Noted: No History of Recent Intubation: No  Behavior/Cognition: Alert;Cooperative;Pleasant mood Self-Feeding Abilities: Needs assist Patient Positioning: Upright in bed Baseline Vocal Quality:  (unable to [place]) Volitional Cough: Weak;Congested    Oral/Motor/Sensory Function Overall Oral Motor/Sensory Function: Moderate  impairment Facial ROM: Within Functional Limits Facial Symmetry: Within Functional Limits Facial Strength: Within Functional Limits Facial Sensation: Within Functional Limits Lingual ROM: Reduced left;Suspected CN XII (hypoglossal) dysfunction Lingual Symmetry: Abnormal symmetry left;Suspected CN XII (hypoglossal) dysfunction Lingual Strength: Suspected CN XII (hypoglossal) dysfunction   Ice Chips Ice chips: Impaired Presentation: Spoon Pharyngeal Phase Impairments: Wet Vocal Quality;Multiple swallows;Decreased hyoid-laryngeal movement;Cough - Immediate;Other (comments) (water dribbled from trach and stoma)   Thin Liquid Thin Liquid: Impaired Presentation: Spoon Pharyngeal  Phase Impairments: Wet Vocal Quality;Cough - Immediate;Other (comments);Decreased hyoid-laryngeal movement;Multiple swallows (Water dribbled from tach and stoma)    Nectar Thick Nectar Thick Liquid: Not tested   Honey Thick Honey Thick Liquid: Not tested   Puree Puree: Not tested   Solid           Herbie Baltimore, MA CCC-SLP  Acute Rehabilitation Services Pager [phone] Office [phone]  Ginni Eichler, [name] 02/16/2020,2:55 PM

## 2020-02-16 NOTE — Progress Notes (Signed)
Patient being suctioned   02/16/20 1104  Vitals  BP (!) 216/99  Patient Position (if appropriate) Lying  Pulse Rate 97  Resp (!) 29  Level of Consciousness  Level of Consciousness Alert  MEWS COLOR  MEWS Score Color Red  Oxygen Therapy  SpO2 94 %  O2 Device Tracheostomy Collar  O2 Flow Rate (L/min) 5 L/min  FiO2 (%) 21 %  PCA/Epidural/Spinal Assessment  Respiratory Pattern Regular;Unlabored  MEWS Score  MEWS Temp 0  MEWS Systolic 2  MEWS Pulse 0  MEWS RR 2  MEWS LOC 0  MEWS Score 4

## 2020-02-16 NOTE — Progress Notes (Signed)
  Speech Language Pathology Treatment: Stacy Moran Speaking valve  Patient Details Name: Stacy Moran MRN: [id number] DOB: 12/26/1955 Today's Date: 02/16/2020 Time: 1400-1420 SLP Time Calculation (min) (ACUTE ONLY): 20 min  Assessment / Plan / Recommendation Clinical Impression  Another unsuccessful PMSV trial. Pt unable to phonate, redirection of air to upper airway minimal and resulting in escape of secretions around trach flange. Pt requires constant wiping of brown secretions around her chest, dressing soaked. Secretions appear to be primarily coming from the upper airway, not pts lungs. Feel secretions are blocking movement of air to upper airway with PMSV. Unsure if pt is able to physically phonate or not. May proceed with FEES this week for direct visualization of airway in conjunction with swallowing for problem solving. Pt needs CCM involvement for trach management. Will f/u for further diagnostic treatment.    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 Admitted on 11/3, [place] on 11/4, vented until 11/12 when pt was trached. Also found to have multinodular goiter complicating respiratory failure so total thyroidectomy also completed on 11/12. Drains removed on 11/14. ATC on 11/15. Changed to 6 cuffledd on 11/17.  There is question of left lingual deviation in notes.       SLP Plan  Continue with current plan of care       Recommendations  Medication Administration: Via alternative means      Patient may use Passy-Muir Speech Valve: with SLP only         Follow up Recommendations: Inpatient Rehab SLP Visit Diagnosis: Dysphagia, oropharyngeal phase (R13.12) Plan: Continue with current plan of care       GO               Herbie Baltimore, MA [place] Pager [phone] Office [phone]  [name] 02/16/2020, 2:59 PM

## 2020-02-16 NOTE — Progress Notes (Signed)
Pt very restless and attempted severa1 ltimes getting oobSsoft mitten applied but pt continue to pulled it off abdomen distended Prn medication given with less effect. E-link RN notified  Pt when restless BP become high  .Calm down with reorientation and redirection and Prn medication  Pt became very chanllenging to care fore  ,trach care and suctioning done pt reposition but remained agitated and restless. Finally haldol ordered and given  At about 0500 with some effect. Will continue to monitor pt closely.

## 2020-02-16 NOTE — Progress Notes (Signed)
   02/16/20 1600  Vitals  BP (!) 218/114  MAP (mmHg) 144  Pulse Rate 99  ECG Heart Rate 99  Resp (!) 28  MEWS COLOR  MEWS Score Color Red  Oxygen Therapy  SpO2 94 %  MEWS Score  MEWS Temp 0  MEWS Systolic 2  MEWS Pulse 0  MEWS RR 2  MEWS LOC 0  MEWS Score 4   Labetalol given as ordered by MD.  Will continue to monitor.

## 2020-02-16 NOTE — Progress Notes (Signed)
[place] Progress Note Patient Name: Voncile Schwarz DOB: 1955-04-23 MRN: [id number]   Date of Service  02/16/2020  HPI/Events of Note  Patient with agitated delirium, last EKG on record has Q Tc of 0.46  eICU Interventions  Haldol 0.5 - 1 mg iv Q 6 hours PRN delirium.        Kerry Kass Candon Caras 02/16/2020, 4:17 AM

## 2020-02-16 NOTE — Progress Notes (Signed)
No new issues or problems.  Awake and aware.  Trach in place.  Mouths words.  Follows commands bilaterally.  Wounds clean and dry.  Neck supple.  Overall progressing reasonably well.  Continue efforts at rehabilitation.

## 2020-02-16 NOTE — Progress Notes (Signed)
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    Past Medical History  No known [place] [facility] Events   11/03 Admitted 11/04 craniotomy with clipping of left PICA 11/12 total thyroidectomy, tracheostomy 11/13 start ABx for fever and tracheobronchitis 11/16 Now 11 days post clipping.  Drain stopped yesterday so left clamped.  Was interactive with PT yesterday. This morning, agitated and complaining of back pain. More somnolent following fentanyl this morning. Stable from neuro check  11/17  Consults:  ENT Constance Holster)  Procedures:  11/03 ETT >>11/09 11/03 EVD >> 11/09 ETT >> 11/12 11/12 Tracheostomy >>  Significant Diagnostic Tests:  11/3 CT head > large volume subarachnoid related to ruptured left PICA aneurysm, intraventricular reflux with hydrocephalus, chronic small vessel disease, chronic small vessel disease 11/9 H&N U/S > 2.3x2.3x2.4 mass in the left neck along the left thyroid lobe 11/17 CT head > near   Micro Data:  11/03 SARS2/ Flu > neg 11/08 respiratory culture >> negative 11/08 blood culture >> 11/11 urine culture >> multiple species 11/12 MRSA PCR >> negative 11/12 sputum: staph aureus, Citrobacter and Haemophilus parainfluenza (B lactamase +)   Antimicrobials:  Zosyn 11/13 >> 11/14 Ceftriaxone 11/14 - 11/20  Interim history/subjective:  Patient had tan secretions coming through tracheostomy, failed speech and swallow evaluation.  Also she pulled out cortrack  Objective   Blood pressure (!) 153/85, pulse 83, temperature 99.3 F (37.4 C), temperature source Oral, resp. rate 15, height 5\' 9"  (1.753 m), weight 68.8 kg, SpO2 93 %.    FiO2 (%):  [21 %] 21 %   Intake/Output Summary (Last 24 hours) at  02/16/2020 1647 Last data filed at 02/16/2020 1539 Gross per 24 hour  Intake 960 ml  Output 3000 ml  Net -2040 ml   Filed Weights   02/08/20 0500 02/15/20 0500 02/16/20 0500  Weight: 59.7 kg 70 kg 68.8 kg    Examination:  General: Middle-aged African-American female, lying on the bed s/p trach, currently on trach collar HENT: Atarumatic, normocephalic. Cuffed trach is unremarkable but does have copious thick mucous secretions Pulm scattered rhonchi but no accessory use Card: Regular rate and rhythm, no murmur abd soft not tender + bowel sounds Neuro awake and alert. 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 Patient is stable, management per neurosurgery  Acute respiratory failure due to subarachnoid hemorrhage. Failure to wean s/p tracheostomy. Now off vent  Trach was changed to cuffless size 6 Currently on trach collar Patient does have copious thick secretions, will send respiratory culture She failed SLP, she may need PEG tube placement  Tracheobronchitis Beta lactamase + H flu, Citrobacter and S.aureus are susceptible.  Patient completed 7 days treatment with ceftriaxone She continued to have excessive thick secretions, will send repeat respiratory culture Hold off on antibiotics as patient is afebrile, not tachycardic with a stable white count  Thyroid mass with tracheal compression. Status post thyroidectomy Continue Synthyroid  Hypertension, uncontrolled Patient pulled out cortrack, unable to take p.o. meds Started on labetalol 10 mg every 6 hours scheduled  Anemia of critical illness. Plan Trend cbc  Hyperglycemia Plan  ssi    Jacky Kindle MD Critical care physician [facility] Critical Care  Pager: [phone] Mobile: [phone]

## 2020-02-17 LAB — GLUCOSE, CAPILLARY
Glucose-Capillary: 75 mg/dL (ref 70–99)
Glucose-Capillary: 80 mg/dL (ref 70–99)
Glucose-Capillary: 90 mg/dL (ref 70–99)
Glucose-Capillary: 95 mg/dL (ref 70–99)
Glucose-Capillary: 97 mg/dL (ref 70–99)
Glucose-Capillary: 99 mg/dL (ref 70–99)

## 2020-02-17 MED ORDER — POTASSIUM CHLORIDE IN NACL 20-0.9 MEQ/L-% IV SOLN
INTRAVENOUS | Status: DC
  Filled 2020-02-17 (×17): qty 1000

## 2020-02-17 MED ORDER — SODIUM CHLORIDE 0.9 % IV SOLN: INTRAVENOUS | Status: DC

## 2020-02-17 NOTE — Progress Notes (Signed)
Overall stable. Patient pulled out her gastrostomy tube yesterday awaiting replacement today. No new issues or problems otherwise.

## 2020-02-17 NOTE — Progress Notes (Signed)
Patient has had diarrhea 3 times, will ask MD fir a rectal tube, patient also pulled out her midline so I pit in a consult for IV therapy.

## 2020-02-18 ENCOUNTER — Inpatient Hospital Stay (HOSPITAL_COMMUNITY): Payer: Self-pay

## 2020-02-18 LAB — CBC
HCT: 31.6 % — ABNORMAL LOW (ref 36.0–46.0)
Hemoglobin: 10 g/dL — ABNORMAL LOW (ref 12.0–15.0)
MCH: 26.1 pg (ref 26.0–34.0)
MCHC: 31.6 g/dL (ref 30.0–36.0)
MCV: 82.5 fL (ref 80.0–100.0)
Platelets: 289 10*3/uL (ref 150–400)
RBC: 3.83 MIL/uL — ABNORMAL LOW (ref 3.87–5.11)
RDW: 16 % — ABNORMAL HIGH (ref 11.5–15.5)
WBC: 7.4 10*3/uL (ref 4.0–10.5)
nRBC: 0.3 % — ABNORMAL HIGH (ref 0.0–0.2)

## 2020-02-18 LAB — GLUCOSE, CAPILLARY
Glucose-Capillary: 108 mg/dL — ABNORMAL HIGH (ref 70–99)
Glucose-Capillary: 126 mg/dL — ABNORMAL HIGH (ref 70–99)
Glucose-Capillary: 72 mg/dL (ref 70–99)
Glucose-Capillary: 81 mg/dL (ref 70–99)
Glucose-Capillary: 85 mg/dL (ref 70–99)
Glucose-Capillary: 86 mg/dL (ref 70–99)
Glucose-Capillary: 87 mg/dL (ref 70–99)
Glucose-Capillary: 88 mg/dL (ref 70–99)

## 2020-02-18 LAB — CULTURE, RESPIRATORY W GRAM STAIN: Culture: NORMAL

## 2020-02-18 MED ORDER — RACEPINEPHRINE HCL 2.25 % IN NEBU
0.5000 mL | INHALATION_SOLUTION | Freq: Once | RESPIRATORY_TRACT | Status: AC
  Administered 2020-02-18: 0.5 mL via RESPIRATORY_TRACT
  Filled 2020-02-18: qty 0.5

## 2020-02-18 MED ORDER — HYDRALAZINE HCL 20 MG/ML IJ SOLN
20.0000 mg | Freq: Four times a day (QID) | INTRAMUSCULAR | Status: DC | PRN
  Administered 2020-02-18 – 2020-02-25 (×10): 20 mg via INTRAVENOUS
  Filled 2020-02-18 (×11): qty 1

## 2020-02-18 MED ORDER — TRANEXAMIC ACID FOR INHALATION
500.0000 mg | Freq: Once | RESPIRATORY_TRACT | Status: AC
  Administered 2020-02-18: 500 mg via RESPIRATORY_TRACT
  Filled 2020-02-18: qty 10

## 2020-02-18 NOTE — Progress Notes (Signed)
Pulled out trach two times this shift and was found and replaced immediately.  First time did not get it all the way out so was just advanced and the second time she did get it out and O2 was applied by collar and site assessed and trach replaced by RT.  Dr Cleotilde Neer PA was notified and orders were obtained.  Rapid response nurse was at side and assessed as well.  Sats remained 94-97% and she was not in distress during that period.  Site was cleaned and because it was the second successful attempt to remove trach, called MD to report and get an order for soft wrist restraints for safety and also for a sitter when one available.  Her nurse did call to notify daughter two times without an answer.  She has had much bleeding from wound under trach and also from trach.  Dr is aware and said he may have an ENT to come assess, as he fears she may be aspirating the blood in her trach.  She does require frequent suctioning and coughs up copious amounts.

## 2020-02-18 NOTE — Progress Notes (Addendum)
   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    Past Medical History  No known [place] Hospital Events   11/03 Admitted 11/04 craniotomy with clipping of left PICA 11/12 total thyroidectomy, tracheostomy 11/13 start ABx for fever and tracheobronchitis 11/16 Now 11 days post clipping.  Drain stopped yesterday so left clamped.  Was interactive with PT yesterday. This morning, agitated and complaining of back pain. More somnolent following fentanyl this morning. Stable from neuro check  11/17  Consults:  ENT Constance Holster)  Procedures:  11/03 ETT >>11/09 11/03 EVD >> 11/09 ETT >> 11/12 11/12 Tracheostomy >>  Significant Diagnostic Tests:  11/3 CT head > large volume subarachnoid related to ruptured left PICA aneurysm, intraventricular reflux with hydrocephalus, chronic small vessel disease, chronic small vessel disease 11/9 H&N U/S > 2.3x2.3x2.4 mass in the left neck along the left thyroid lobe 11/17 CT head > near   Micro Data:  11/03 SARS2/ Flu > neg 11/08 respiratory culture >> negative 11/08 blood culture >> 11/11 urine culture >> multiple species 11/12 MRSA PCR >> negative 11/12 sputum: staph aureus, Citrobacter and Haemophilus parainfluenza (B lactamase +)   Antimicrobials:  Zosyn 11/13 >> 11/14 Ceftriaxone 11/14 - 11/20  Interim history/subjective:  No oral access, Bps elevated. Stable report of oozing from excoriations from trach site.  Objective   Blood pressure (!) 178/98, pulse 91, temperature 98.6 F (37 C), temperature source Axillary, resp. rate 18, height 5\' 9"  (1.753 m), weight 68.3 kg, SpO2 94 %.    FiO2 (%):  [21 %-28 %] 21 %   Intake/Output Summary (Last 24 hours) at 02/18/2020 9191 Last data filed  at 02/17/2020 2008 Gross per 24 hour  Intake 753.32 ml  Output 625 ml  Net 128.32 ml   Filed Weights   02/15/20 0500 02/16/20 0500 02/18/20 0349  Weight: 70 kg 68.8 kg 68.3 kg    Examination:  General: Middle-aged African-American female, sitting up in bed, currently on trach collar HENT: Atarumatic, normocephalic. Cuffed trach with evidence of bleeding below trach, no blood seen in TC tubing Card: Regular rate and rhythm, no murmur abd soft not tender + bowel sounds Neuro awake and alert. Moving all ext interactive, communicateive  Resolved Hospital Problem list     Assessment & Plan:    Acute respiratory failure due to subarachnoid hemorrhage. Failure to wean s/p tracheostomy. Now off vent  Bleeding around tracheostomy Trach was changed to cuffless size 6 Currently on trach collar, doing well Patient does have copious thick secretions, resp culture with OP flora, stable so will not initiate empiric abx She failed SLP, she may need PEG tube placement, CorTrak to be replaced 11/22 Re-consult ENT (trach placed by ENT) consult for bleeding around trach, TXA and epi nebs ordered per prelim recs  HTN PRN meds per primary Establish oral access and resume/escalate oral meds  Tracheobronchitis (Resolved) Beta lactamase + H flu, Citrobacter and S.aureus are susceptible.  Patient completed 7 days treatment with ceftriaxone

## 2020-02-18 NOTE — Progress Notes (Signed)
SLP Cancellation Note  Patient Details Name: Roanna Reaves MRN: [id number] DOB: 05/07/1955   Cancelled treatment:        Given multiple events this am with pt (high BP, postponed study and then trach pulled out), will postpone MBS until tomorrow. Recommend Cortrak be replaced today   Laquinton Bihm, [name] 02/18/2020, 10:33 AM

## 2020-02-18 NOTE — Progress Notes (Addendum)
Nutrition Follow-up  DOCUMENTATION CODES:   Not applicable  INTERVENTION:  Once PEG is placed and ready for use, begin: -Osmolite 1.2 at 64ml/hr, advance 8ml/hr Q4H until goal rate of 60 ml/h (1440 ml per day) is reached -Prosource TF 45 ml BID  Provides 1819 kcal, 101 gm protein, 1167 ml free water daily  300 ml free water every 4 hours Total free water: 2967 ml     NUTRITION DIAGNOSIS:   Inadequate oral intake related to acute illness as evidenced by estimated needs.  ongoing  GOAL:   Patient will meet greater than or equal to 90% of their needs  Will address with TF  MONITOR:   Vent status, Labs, Weight trends, Skin, I & O's  REASON FOR ASSESSMENT:   Ventilator    ASSESSMENT:   64 year old female who presented to the ED on 11/03 with headache. 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 Pt required intubation for airway protection.  11/03 Admitted 11/04 SAH, L PICA aneurysm to OR for craniotomy, clipping  11/09 extubated but immediately re-intubated 11/12 s/p thyroidectomy with tracheostomy; cortrak placed; tip gastric  11/16 EVD removed 11/17 trach exchange to facilitate PMV 11/20 Cortrak removed by pt 11/22 trach change (pt self-decannulated)  Pt is trach dependent s/p SAH. Per CCM, pt tolerating TC trials. Pt having significant secretions via trach with intermittent bleeding. Pt had MBS today which revealed subglottic obstruction. CT neck ordered to rule out infection/abscess and results of sputum culture are pending. Of note, pt did fail MBS and IR has been consulted for PEG.   Pt currently with the following TF orders: Osmolite 1.2 cal @ 58ml/hr, 66ml Prosource TF BID, 315ml free water Q4H. Once PEG is placed and ready for use, recommend resuming this regimen; however, would recommend initiating at 84ml/hr and advancing 17ml/hr Q4H until goal rate of 11ml/hr is reached.   UOP: 2281ml documented  today  Admit wt: 66.9 kg Current wt: 66.8 kg  Labs: K+ 3.0 (L), CBGs 81-73-89 Medications: colace, ss novolog Q4H, 12 units levemir daily, reglan, miralax IVF: NS w/ KCl 28mEq @ 181ml/hr  Diet Order:   Diet Order    None      EDUCATION NEEDS:   No education needs have been identified at this time  Skin:  Skin Assessment: Skin Integrity Issues: Skin Integrity Issues:: Stage II Stage II: throat  Last BM:  11/16  Height:   Ht Readings from Last 1 Encounters:  01/30/20 5\' 9"  (1.753 m)    Weight:   Wt Readings from Last 1 Encounters:  02/18/20 68.3 kg    Ideal Body Weight:  65.9 kg  BMI:  Body mass index is 22.24 kg/m.  Estimated Nutritional Needs:   Kcal:  1700-1900  Protein:  95-110 grams  Fluid:  > 1.7 L/day    Larkin Ina, MS, RD, LDN RD pager number and weekend/on-call pager number located in [place].

## 2020-02-18 NOTE — Progress Notes (Signed)
Will proceed with MBS hopefully before cortrak replaced. Anticipate that pt will continue to need tube feeding, but will take advantage of no cortrak to evaluate swallow. Study planned for 9 am.   Herbie Baltimore, MA [place] Pager [phone] Office [phone]

## 2020-02-18 NOTE — Progress Notes (Signed)
Physical Therapy Treatment Patient Details Name: Stacy Moran MRN: [id number] DOB: 05-10-1955 Today's Date: 02/18/2020    History of Present Illness Pt is a 64 y/o female with no known PHX admitted with sudden onset of HA, BP 262/123, Imaging showing large volume SAHdue to a ruptured L PICA aneurysm associated with intraventricular hydrocephalus.  11/4, pt s/p L far-lateral craniotomy for clipping of PICA aneurysm. 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    PT Comments    Pt with eventful morning. Currently awaiting ENT for bloody secretions at trach. She also pulled trach out this morning. RT called and confirmed trach appears to be in place. She is also awaiting new cortrak due to pulling hers out yesterday. Pt restless this morning. Limited session to bed for safety. She required min/mod assist supine <> sit. She demonstrates poor sitting balance. Pt performed LE exercises supine in bed. Bed transitioned to chair position. Pt able to pull trunk forward to sitting using bilat lower rails. Bed in lowest position with HOB at 45 degrees at end of session. VSS. SpO2 95-97% on 5L via trach collar.      Follow Up Recommendations  CIR     Equipment Recommendations  Wheelchair (measurements PT);Wheelchair cushion (measurements PT)    Recommendations for Other Services       Precautions / Restrictions Precautions Precautions: Fall;Other (comment) Precaution Comments: trach    Mobility  Bed Mobility Overal bed mobility: Needs Assistance Bed Mobility: Supine to Sit;Sit to Supine     Supine to sit: Min assist;HOB elevated Sit to supine: HOB elevated;Mod assist   General bed mobility comments: Assist to transition to/from EOB. Pt able to pull trunk forward in sitting using lower rails with bed in chair position without physical assist.  Transfers                    Ambulation/Gait                  Stairs             Wheelchair Mobility    Modified Rankin (Stroke Patients Only) Modified Rankin (Stroke Patients Only) Pre-Morbid Rankin Score: No symptoms Modified Rankin: Severe disability     Balance Overall balance assessment: Needs assistance Sitting-balance support: Single extremity supported;Feet supported Sitting balance-Leahy Scale: Poor Sitting balance - Comments: reliant on external assist                                    Cognition Arousal/Alertness: Awake/alert Behavior During Therapy: Restless;Impulsive Overall Cognitive Status: Impaired/Different from baseline Area of Impairment: Attention;Following commands;Safety/judgement;Problem solving                   Current Attention Level: Sustained Memory: Decreased recall of precautions Following Commands: Follows one step commands consistently Safety/Judgement: Decreased awareness of safety;Decreased awareness of deficits Awareness: Intellectual Problem Solving: Requires verbal cues;Requires tactile cues General Comments: Attempted communication by allowing pt to write. All she wrote was "Good morning. How are you." She then declined any further written communication.      Exercises General Exercises - Lower Extremity Ankle Circles/Pumps: AROM;Both;10 reps Heel Slides: AROM;Right;Left;10 reps Straight Leg Raises: AAROM;Right;Left;5 reps    General Comments General comments (skin integrity, edema, etc.): VSS      Pertinent Vitals/Pain Pain Assessment: No/denies pain    Home Living  Prior Function            PT Goals (current goals can now be found in the care plan section) Acute Rehab PT Goals Patient Stated Goal: not stated Progress towards PT goals: Progressing toward goals    Frequency    Min 4X/week      PT Plan Current plan remains appropriate    Co-evaluation              AM-PAC PT "6 Clicks" Mobility    Outcome Measure  Help needed turning from your back to your side while in a flat bed without using bedrails?: A Little Help needed moving from lying on your back to sitting on the side of a flat bed without using bedrails?: A Lot Help needed moving to and from a bed to a chair (including a wheelchair)?: Total Help needed standing up from a chair using your arms (e.g., wheelchair or bedside chair)?: A Lot Help needed to walk in hospital room?: Total Help needed climbing 3-5 steps with a railing? : Total 6 Click Score: 10    End of Session Equipment Utilized During Treatment: Oxygen Activity Tolerance: Patient tolerated treatment well Patient left: in bed;with call bell/phone within reach;with bed alarm set Nurse Communication: Mobility status PT Visit Diagnosis: Hemiplegia and hemiparesis;Other symptoms and signs involving the nervous system (R29.898);Other abnormalities of gait and mobility (R26.89) Hemiplegia - Right/Left: Left Hemiplegia - dominant/non-dominant: Non-dominant Hemiplegia - caused by: Other Nontraumatic intracranial hemorrhage     Time: 8003-4917 PT Time Calculation (min) (ACUTE ONLY): 24 min  Charges:  $Therapeutic Exercise: 8-22 mins $Therapeutic Activity: 8-22 mins                     Lorrin Goodell, PT  Office # [phone] Pager [phone]    Stacy Moran 02/18/2020, 12:17 PM

## 2020-02-18 NOTE — Progress Notes (Signed)
  NEUROSURGERY PROGRESS NOTE   No issues overnight.   EXAM:  BP (!) 178/98 (BP Location: Right Arm)   Pulse 91   Temp 97.7 F (36.5 C) (Axillary)   Resp 18   Ht 5\' 9"  (1.753 m)   Wt 68.3 kg   SpO2 94%   BMI 22.24 kg/m   Awake, alert, oriented  Speech fluent, appropriate  CN grossly intact x Left CNXII MAE well Trach in place, tan/red secretions  IMPRESSION:  64 y.o. female s/p SAH d#20 clipping of LPICA aneurysm. Neurologically stable. S/p trach and thyroidectomy  PLAN: - MBS today, if unable to progress to PO diet, will likely need gastrostomy - Cont supportive care - Dispo planning. Suspect she would be a good candidate for CIR - can d/c staples today - finish 21d [place]

## 2020-02-18 NOTE — Progress Notes (Signed)
Paged critical care pulmonoligist [facility] he advised to hold heparin because of copious bleeding around and in the trach.he stated than ENT will follow up.

## 2020-02-18 NOTE — Progress Notes (Signed)
Pt pulled trach out. respiratory therapy paged and mitts placed on patient. Haldol given for agitation.

## 2020-02-18 NOTE — Procedures (Signed)
Tracheostomy Change Note  Patient Details:   Name: Stacy Moran DOB: 10/22/1955 MRN: [id number]    Airway Documentation:     Evaluation  O2 sats: stable throughout Complications: No apparent complications Patient did tolerate procedure well. Bilateral Breath Sounds: Diminished   RT was called to the room by RN due to patient pulling her trach out. Lurline Idol was placed with a spare trach in the patient's room (#6 cuffless flex [place]) Lurline Idol was secured with new trach ties. Placement was confirmed by CO2 detector. An additional trach was ordered to be kept at the bedside.      Demetruis Depaul L 02/18/2020, 1:25 PM

## 2020-02-18 NOTE — Progress Notes (Signed)
OTO HNS PROGRESS NOTE  Paged by critical care physician due to concerns for bleeding around tracheostomy.  Per nursing team and chart review, patient self decannulated twice earlier today.  Tracheostomy was successfully replaced each time with no desaturations, no distress.  Soft wrist restraints and bedside sitter were ordered for patient safety.  Discussed with RT, she notes no evidence of bright red bleeding in or around tracheostomy with her assessments.  Patient does have a healing neck incision from tracheostomy and thyroidectomy with evidence of granulation tissue inferiorly and laterally.  No evidence of bleeding on my examination today.  Bedside tracheoscopy was also performed, no evidence of bleeding in distal trachea. A culture was obtained from her neck incision.  Advised continued routine trach care with efforts to maintain neck incision area clean and dry.  Follow-up on culture results.  Agree with bedside sitter and soft wrist restraints with recent attempts at decannulation.  Keep Posey ties well secured.  Previously noted bleeding may have been due to disruption of granulation tissue with decannulation and reinsertion of trach.  If bleeding resumes, can consider nebulized TXA or racemic epi.  Notify on-call ENT with any questions or concerns.   Jason Coop, [facility]

## 2020-02-18 NOTE — Progress Notes (Signed)
Inpatient Rehab Admissions Coordinator:   Note pt more restless, pulled out cortrak and trach.  Needing IV haldol for sedation.  I have not heard from pt's daughter yet.  Will continue to follow.   Shann Medal, PT, DPT Admissions Coordinator [phone] 02/18/20  12:34 PM

## 2020-02-18 NOTE — Progress Notes (Signed)
Pt in yellow MEWS protocol started and Dr Kathyrn Sheriff notified. Pt is showing no signs of distress and O2 sat is 96

## 2020-02-18 NOTE — Progress Notes (Signed)
Called to patient's room due to patient pulling trach. Upon entering the room the patient's trach was in place (confirmed with CO2 detector) The patient was not in distress and all vitals were within normal limits. Lurline Idol is currently secured with trach ties. Safety mittens were placed on the patient by RN. MD has been paged.

## 2020-02-19 ENCOUNTER — Inpatient Hospital Stay (HOSPITAL_COMMUNITY): Payer: Self-pay

## 2020-02-19 DIAGNOSIS — I10 Essential (primary) hypertension: Secondary | ICD-10-CM | POA: Diagnosis present

## 2020-02-19 DIAGNOSIS — E890 Postprocedural hypothyroidism: Secondary | ICD-10-CM | POA: Diagnosis not present

## 2020-02-19 DIAGNOSIS — Z930 Tracheostomy status: Secondary | ICD-10-CM

## 2020-02-19 DIAGNOSIS — I69891 Dysphagia following other cerebrovascular disease: Secondary | ICD-10-CM

## 2020-02-19 LAB — GLUCOSE, CAPILLARY
Glucose-Capillary: 89 mg/dL (ref 70–99)
Glucose-Capillary: 83 mg/dL (ref 70–99)
Glucose-Capillary: 81 mg/dL (ref 70–99)
Glucose-Capillary: 78 mg/dL (ref 70–99)
Glucose-Capillary: 89 mg/dL (ref 70–99)
Glucose-Capillary: 92 mg/dL (ref 70–99)

## 2020-02-19 MED ORDER — INFLUENZA VAC SPLIT QUAD 0.5 ML IM SUSY
0.5000 mL | PREFILLED_SYRINGE | INTRAMUSCULAR | Status: AC
  Administered 2020-02-20: 0.5 mL via INTRAMUSCULAR
  Filled 2020-02-19: qty 0.5

## 2020-02-19 MED ORDER — SODIUM CHLORIDE 0.9 % IV SOLN
2.0000 g | Freq: Two times a day (BID) | INTRAVENOUS | Status: AC
  Administered 2020-02-19 – 2020-02-25 (×14): 2 g via INTRAVENOUS
  Filled 2020-02-19 (×14): qty 2

## 2020-02-19 NOTE — Progress Notes (Addendum)
Addendum at 10:41 - pt had been given Haldol around 5 am so study postponed to 12:00. In the future, consider scheduling sedation that can last overnight and avoiding early am sedation so pt will be alert for therapies during the day.    MBS scheduled for 1030. Please ensure pt is available and not sedated prior to exam.   Herbie [place], MA [employer] Pager [phone] Office [phone]

## 2020-02-19 NOTE — Progress Notes (Signed)
  NEUROSURGERY PROGRESS NOTE   Events reviewed from yesterday Patient calm this. No concerns this am  EXAM:  BP (!) 142/78   Pulse (!) 103   Temp 99 F (37.2 C) (Oral)   Resp 20   Ht 5\' 9"  (1.753 m)   Wt 66.8 kg   SpO2 94%   BMI 21.75 kg/m   Awake, alert Mouths responses appropriately CN grossly intact except Left CNXII MAE well, seemingly nonfocal Trach in place, tan/red secretions  IMPRESSION/PLAN 64 y.o. 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 Neurologically stable. S/p trach and thyroidectomy. - MBS today, if unable to progress to PO diet, will likely need gastrostomy - Cont supportive care - last day of nimotop

## 2020-02-19 NOTE — Progress Notes (Signed)
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  11/4, pt s/p L far-lateral craniotomy for clipping of PICA aneurysm. 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    PT Comments    Pt seen with OT to maximize function as pt has been lethargic each time PT checked on her today. Opens eyes to name. Mod A +2 for SL to sit with mod A to maintain sitting due to posterior lean with bouts of min-guard when pt performing UE activities with fwd wt shift. Pt followed simple commands 25% of time. Pt was able to scoot 2' to L along EOB with min A +2 at hips. Continue to recommend CIR to maximize pt's function when medically stable. PT will continue to follow.    Follow Up Recommendations  CIR     Equipment Recommendations  Wheelchair (measurements PT);Wheelchair cushion (measurements PT)    Recommendations for Other Services       Precautions / Restrictions Precautions Precautions: Fall;Other (comment) Precaution Comments: trach, wrist restraints Restrictions Weight Bearing Restrictions: No    Mobility  Bed Mobility Overal bed mobility: Needs Assistance Bed Mobility: Rolling;Sidelying to Sit;Sit to Sidelying Rolling: Min guard Sidelying to sit: Mod assist;+2 for physical assistance;+2 for safety/equipment   Sit to supine: HOB elevated;Mod assist Sit to sidelying: Mod assist General bed mobility comments: pt in sidelying to pts L side upon arrival, MOD A +2 to elevate pts trunk into sitting and scoot hips to EOB. pt noted to lower upper body back to sidelying with min guard for line mgmt and safety and required MODA  to elevate BLEs  back to bed  Transfers Overall transfer level: Needs assistance Equipment used: 2 person hand held assist Transfers: Lateral/Scoot Transfers          Lateral/Scoot Transfers: Min assist;+2 physical assistance General transfer comment: deferred sit<>stand d/t level of arousal however pt was able to complete lateral scoot to [facility] with use of bed pad and MIN A to shift hips laterally  Ambulation/Gait             General Gait Details: NT   Stairs             Wheelchair Mobility    Modified Rankin (Stroke Patients Only) Modified Rankin (Stroke Patients Only) Pre-Morbid Rankin Score: No symptoms Modified Rankin: Severe disability     Balance Overall balance assessment: Needs assistance Sitting-balance support: Feet supported;Bilateral upper extremity supported;Single extremity supported Sitting balance-Leahy Scale: Poor Sitting balance - Comments: pt required up to MOD A for static sitting balance however pt with moments of minguard with BUE supported. Worked on UE activities in sitting with OT  Postural control: Posterior lean     Standing balance comment: defer                            Cognition Arousal/Alertness: Lethargic Behavior During Therapy: Flat affect;Impulsive Overall Cognitive Status: Impaired/Different from baseline Area of Impairment: Attention;Following commands;Safety/judgement;Problem solving                 Orientation Level: Disoriented to;Time;Situation Current Attention Level: Sustained Memory: Decreased recall of precautions Following Commands: Follows one step commands  with increased time Safety/Judgement: Decreased awareness of safety;Decreased awareness of deficits Awareness: Intellectual Problem Solving: Requires verbal cues General Comments: pt restless, wants to curl up in bed but then hands near trach so had to be positioned so that she could not reach trach. Followed basic commands 25% of time       Exercises      General Comments General comments (skin integrity, edema, etc.): HR max 125 bpm in sitting. Pt on trach collar FiO2 28% 5LPM, O2 98%      Pertinent Vitals/Pain Pain Assessment: Faces Faces Pain Scale: No hurt    Home Living                      Prior Function            PT Goals (current goals can now be found in the care plan section) Acute Rehab PT Goals Patient Stated Goal: not stated PT Goal Formulation: With patient Time For Goal Achievement: 02/29/20 Potential to Achieve Goals: Fair Progress towards PT goals: Progressing toward goals    Frequency    Min 4X/week      PT Plan Current plan remains appropriate    Co-evaluation PT/OT/SLP Co-Evaluation/Treatment: Yes Reason for Co-Treatment: Complexity of the patient's impairments (multi-system involvement);For patient/therapist safety;Necessary to address cognition/behavior during functional activity PT goals addressed during session: Mobility/safety with mobility;Balance OT goals addressed during session: ADL's and self-care      AM-PAC PT "6 Clicks" Mobility   Outcome Measure  Help needed turning from your back to your side while in a flat bed without using bedrails?: A Little Help needed moving from lying on your back to sitting on the side of a flat bed without using bedrails?: A Lot Help needed moving to and from a bed to a chair (including a wheelchair)?: Total Help needed standing up from a chair using your arms (e.g., wheelchair or bedside chair)?: A Lot Help needed to walk in hospital room?: Total Help needed climbing 3-5 steps with a railing? : Total 6 Click Score: 10    End of Session Equipment Utilized During Treatment: Oxygen Activity Tolerance: Patient tolerated treatment well Patient left: in bed;with call bell/phone within reach;with bed alarm set Nurse Communication: Mobility status PT Visit Diagnosis: Hemiplegia and hemiparesis;Other symptoms and signs involving  the nervous system (R29.898);Other abnormalities of gait and mobility (R26.89) Hemiplegia - Right/Left: Left Hemiplegia - dominant/non-dominant: Non-dominant Hemiplegia - caused by: Other Nontraumatic intracranial hemorrhage     Time: 7672-0947 PT Time Calculation (min) (ACUTE ONLY): 25 min  Charges:  $Therapeutic Activity: 8-22 mins                     Leighton Roach, [place]  Pager [phone] Office [place] 02/19/2020, 5:08 PM

## 2020-02-19 NOTE — Progress Notes (Signed)
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  11/4, pt s/p L far-lateral craniotomy for clipping of PICA aneurysm. 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   OT comments  Pt seen in conjunction with PT to maximize pts activity tolerance and participation as pt more lethargic this session. Pt required up to MOD A +2 to transition from supine<>EOB and required up to MOD A for static sitting balance. Pt able to laterally scoot to [facility] with MIN A and use of bed pad to scoot hips. Pt following commands with increased time. Pt continues to be slow to process but likely secondary to level of arousal, did note pt to smile during session. Pt continues to present with decreased activity tolerance, impaired balance, generalized weakness and decreased ability to care for self making pt appropriate for CIR placement, Will follow acutely per POC.   Follow Up Recommendations  CIR    Equipment Recommendations  Wheelchair (measurements OT);Wheelchair cushion (measurements OT);Hospital bed    Recommendations for Other Services      Precautions / Restrictions Precautions Precautions: Fall;Other (comment) Precaution Comments: trach, wrist restraints Restrictions Weight Bearing Restrictions: No       Mobility Bed Mobility Overal bed mobility: Needs Assistance Bed Mobility: Rolling;Sidelying to Sit;Sit to Sidelying Rolling: Min guard Sidelying to sit: Mod assist;+2 for physical assistance;+2 for safety/equipment     Sit to sidelying: Mod assist General bed mobility comments: pt in sidelying to pts L side upon arrival, MOD A +2 to elevate  pts trunk into sitting and scoot hips to EOB. pt noted to lower upper body back to sidelying with min guard for line mgmt and safety and required MODA  to elevate BLEs back to bed  Transfers Overall transfer level: Needs assistance Equipment used: 2 person hand held assist Transfers: Lateral/Scoot Transfers          Lateral/Scoot Transfers: Min assist General transfer comment: deferred sit<>stand d/t level of arousal however pt was able to complete lateral scoot to [facility] with use of bed pad and MIN A to shift hips laterally    Balance Overall balance assessment: Needs assistance Sitting-balance support: Feet supported;Bilateral upper extremity supported;Single extremity supported Sitting balance-Leahy Scale: Poor Sitting balance - Comments: pt required up to MOD A for static sitting balnace however pt with moments of minguard with BUE supported       Standing balance comment: defer                           ADL either performed or assessed with clinical judgement   ADL Overall ADL's : Needs assistance/impaired     Grooming: Wash/dry face;Sitting;Total assistance Grooming Details (indicate cue type and reason): pt requried total A to wash face from EOB despite tactile cues and hand over hand assist             Lower Body Dressing: Total assistance;Bed level Lower Body Dressing Details (indicate cue type and reason): to don socks   Toilet Transfer Details (indicate cue type and reason): deferred for safety as pt presents with decreased level of arousal         Functional mobility during ADLs:  Moderate assistance;+2 for physical assistance (bed mobility only) General ADL Comments: session limited by lethargy this session. session focus on sitting balance EOB, UB ADLs and light therex.     Vision       Perception     Praxis      Cognition Arousal/Alertness: Lethargic Behavior During Therapy: Flat affect;Impulsive Overall Cognitive Status:  Impaired/Different from baseline Area of Impairment: Attention;Following commands;Safety/judgement;Problem solving                   Current Attention Level: Sustained   Following Commands: Follows one step commands with increased time Safety/Judgement: Decreased awareness of safety;Decreased awareness of deficits   Problem Solving: Requires verbal cues General Comments: pt more lethargic this session but following commands with increased time        Exercises     Shoulder Instructions       General Comments HR max 125 bpm with mobility, pt on trach collar FiO2 28% 5LPM; O2 WFL    Pertinent Vitals/ Pain       Pain Assessment: Faces Faces Pain Scale: No hurt  Home Living                                          Prior Functioning/Environment              Frequency  Min 2X/week        Progress Toward Goals  OT Goals(current goals can now be found in the care plan section)  Progress towards OT goals: Not progressing toward goals - comment (decreased level of arousal)  Acute Rehab OT Goals Patient Stated Goal: not stated OT Goal Formulation: With patient Time For Goal Achievement: 02/25/20 Potential to Achieve Goals: Good  Plan Discharge plan remains appropriate;Frequency remains appropriate    Co-evaluation      Reason for Co-Treatment: Complexity of the patient's impairments (multi-system involvement);For patient/therapist safety;To address functional/ADL transfers;Necessary to address cognition/behavior during functional activity   OT goals addressed during session: ADL's and self-care      AM-PAC OT "6 Clicks" Daily Activity     Outcome Measure   Help from another person eating meals?: Total Help from another person taking care of personal grooming?: A Lot Help from another person toileting, which includes using toliet, bedpan, or urinal?: Total Help from another person bathing (including washing, rinsing, drying)?:  Total Help from another person to put on and taking off regular upper body clothing?: Total Help from another person to put on and taking off regular lower body clothing?: Total 6 Click Score: 7    End of Session Equipment Utilized During Treatment: Oxygen;Other (comment) (fiO2 28% 5LPM)  OT Visit Diagnosis: Unsteadiness on feet (R26.81);Muscle weakness (generalized) (M62.81);Other abnormalities of gait and mobility (R26.89) Hemiplegia - Right/Left: Left Hemiplegia - dominant/non-dominant: Non-Dominant Hemiplegia - caused by: Nontraumatic intracerebral hemorrhage   Activity Tolerance Patient limited by lethargy   Patient Left in bed;with call bell/phone within reach;with bed alarm set;with restraints reapplied   Nurse Communication Mobility status        Time: 7510-2585 OT Time Calculation (min): 27 min  Charges: OT General Charges $OT Visit: 1 Visit OT Treatments $Self Care/Home Management : 8-22 mins  Lanier Clam., COTA/L Acute Rehabilitation Services [phone] [place] 02/19/2020, 4:05 PM

## 2020-02-19 NOTE — Progress Notes (Signed)
Modified Barium Swallow Progress Note  Patient Details  Name: Stacy Moran MRN: [id number] Date of Birth: 07-24-1955  Today's Date: 02/19/2020  Modified Barium Swallow completed.  Full report located under Chart Review in the Imaging Section.  Brief recommendations include the following:  Clinical Impression  Pt was seen for MBS and was profoundly lethargic due to sedation. The pt has extremely abnormal pharyngeal and laryngeal anatomy with no upper airway or esophageal patency. Barium retained in upper pharynx until small portions trickled into what appears to be the vestibule and trachea. Tissue occludes all open spaces. Study d/c after one bolus given severity of dysphagia. Recommend long-term altnerative means of nutrition and further evaluation of pharyngeal and laryngeal anatomy.    Swallow Evaluation Recommendations       SLP Diet Recommendations: NPO;Alternative means - long-term       Medication Administration: Via alternative means               Oral Care Recommendations: Oral care QID        [name] 02/19/2020,1:36 PM

## 2020-02-19 NOTE — Consult Note (Signed)
Medical Consultation   Stacy Moran  YBW:[id number]  DOB: January 15, 1956  DOA: 01/30/2020  PCP: No primary care provider on file.   Outpatient Specialists: None   Requesting physician: PCCM  Reason for consultation: Neurosurgery needs a medical consult.  Had aneurysm with clipping, prolonged intubation with enlarged thyroid.  She had thyroidectomy in order for trach to be placed.  She has been off the vent for a week and stable.  Needs routine trach care - Dr. Constance Holster is following.  Likely needs PEG - IR consult request placed.  Has sputum culture pending; on MBS she appears to have subglottal obstruction and so CT ordered and started on Cefepime.  Has developed HTN, DM while hospitalized.  History of Present Illness: Stacy Moran is an 64 y.o. 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   She had a tracheostomy and total thyroidectomy on 11/12; she has self-decanulated twice and is now in soft wrist restraints with a bedside sitter.   She has been recommended for G-tube placement.  She is awake and appears to be able to answer yes/no questions by head movements but is unable to talk.  She denies pain.  Appears to have cognitive function.   Review of Systems:  ROS As per HPI otherwise 10 point review of systems negative.  Limited by inability to speak.   Past Medical History: History reviewed. No pertinent past medical history.  Past Surgical History: Past Surgical History:  Procedure Laterality Date  . CRANIOTOMY Left 01/30/2020   Procedure: LEFT FAR LATERAL CRANIOTOMY FOR ANEURYSM CLIPPING;  Surgeon: [name], MD;  Location: [name];  Service: Neurosurgery;  Laterality: Left;  . IR ANGIO INTRA EXTRACRAN SEL INTERNAL CAROTID BILAT MOD SED  01/30/2020  . IR ANGIO VERTEBRAL SEL VERTEBRAL UNI L MOD SED  01/30/2020  . RADIOLOGY WITH ANESTHESIA N/A 01/30/2020   Procedure: IR WITH ANESTHESIA;   Surgeon: [name], MD;  Location: [facility];  Service: Radiology;  Laterality: N/A;  . THYROIDECTOMY N/A 02/08/2020   Procedure: THYROIDECTOMY;  Surgeon: [name], MD;  Location: [facility];  Service: ENT;  Laterality: N/A;  . TRACHEOSTOMY TUBE PLACEMENT N/A 02/08/2020   Procedure: TRACHEOSTOMY;  Surgeon: [name], MD;  Location: [facility];  Service: ENT;  Laterality: N/A;     Allergies:  No Known Allergies   Social History:  has no history on file for tobacco use, alcohol use, and drug use.   Family History: History reviewed. No pertinent family history.    Physical Exam: Vitals:   02/19/20 1130 02/19/20 1547 02/19/20 1614 02/19/20 1629  BP: (!) 162/82  (!) 188/96 (!) 156/80  Pulse:  98 98   Resp: _0 Temp: 99.6 F (37.6 C)  98.2 F (36.8 C)   TempSrc: Oral  Axillary   SpO2:  96% 97%   Weight:      Height:        Constitutional: Alert and awake,  not in any acute distress. Eyes: anicteric sclera,  ENMT: external ears and nose appear normal, normal hearing, Lips appear normal Neck: trach in place with foul-smelling seropurulent drainage CVS: S1-S2 clear, no murmur rubs or gallops, no LE edema, normal pedal pulses  Respiratory:  scattered rhonchi. Respiratory effort normal. No accessory muscle use.  Abdomen: soft nontender, nondistended Musculoskeletal: : no cyanosis, clubbing or edema noted bilaterally Neuro:  moves all extremities to command Psych: awake, answers questions with head shakes, laughed at something I said that was funny Skin: no rashes or lesions or ulcers, no induration or nodules    Data reviewed:  I have personally reviewed the recent labs and imaging studies  Pertinent Labs:   11/22:  WBC 7.4 Hgb 10.0 Trach culture pending  11/19: K+ 3.0  Inpatient Medications:   Scheduled Meds: . amLODipine  10 mg Per Tube Daily  . chlorhexidine gluconate (MEDLINE KIT)  15 mL Mouth Rinse BID  . Chlorhexidine Gluconate Cloth  6 each Topical  Daily  . docusate  100 mg Per Tube BID  . feeding supplement (PROSource TF)  45 mL Per Tube BID  . free water  300 mL Per Tube Q4H  . heparin  5,000 Units Subcutaneous Q8H  . [START ON 02/20/2020] influenza vac split quadrivalent PF  0.5 mL Intramuscular Tomorrow-1000  . insulin aspart  0-20 Units Subcutaneous Q4H  . insulin detemir  12 Units Subcutaneous QHS  . labetalol  300 mg Per Tube Q8H  . levothyroxine  100 mcg Per Tube Q0600  . losartan  100 mg Per Tube Daily  . mouth rinse  15 mL Mouth Rinse 10 times per day  . metoCLOPramide  5 mg Per Tube Q6H  . niMODipine  60 mg Oral Q4H   Or  . niMODipine  60 mg Per Tube Q4H  . pantoprazole sodium  40 mg Per Tube Daily  . polyethylene glycol  17 g Per Tube Daily  . polyvinyl alcohol  1 drop Left Eye BID  . sodium chloride flush  10-40 mL Intracatheter Q12H   Continuous Infusions: . 0.9 % NaCl with KCl 20 mEq / L 100 mL/hr at 02/19/20 1413  . ceFEPime (MAXIPIME) IV 2 g (02/19/20 1717)  . feeding supplement (OSMOLITE 1.2 CAL) Stopped (02/16/20 0955)     Radiological Exams on Admission: DG Swallowing Func-Speech Pathology  Result Date: 02/19/2020 Completed by [name], SLP Student Supervised and reviewed by Herbie Baltimore MA CCC-SLP Objective Swallowing Evaluation: Type of Study: MBS-Modified Barium Swallow Study  Patient Details Name: Stacy Moran MRN: [id number] Date of Birth: 17-Jun-1955 Today's Date: 02/19/2020 Time: SLP Start Time (ACUTE ONLY): 1250 -SLP Stop Time (ACUTE ONLY): 1302 SLP Time Calculation (min) (ACUTE ONLY): 12 min Past Medical History: No past medical history on file. Past Surgical History: Past Surgical History: Procedure Laterality Date . CRANIOTOMY Left 01/30/2020  Procedure: LEFT FAR LATERAL CRANIOTOMY FOR ANEURYSM CLIPPING;  Surgeon: [name], MD;  Location: [facility];  Service: Neurosurgery;  Laterality: Left; . IR ANGIO INTRA EXTRACRAN SEL INTERNAL CAROTID BILAT MOD SED  01/30/2020 . IR ANGIO VERTEBRAL SEL  VERTEBRAL UNI L MOD SED  01/30/2020 . RADIOLOGY WITH ANESTHESIA N/A 01/30/2020  Procedure: IR WITH ANESTHESIA;  Surgeon: [name], MD;  Location: [facility];  Service: Radiology;  Laterality: N/A; . THYROIDECTOMY N/A 02/08/2020  Procedure: THYROIDECTOMY;  Surgeon: [name], MD;  Location: [facility];  Service: ENT;  Laterality: N/A; . TRACHEOSTOMY TUBE PLACEMENT N/A 02/08/2020  Procedure: TRACHEOSTOMY;  Surgeon: [name], MD;  Location: [facility];  Service: ENT;  Laterality: N/A; HPI: 64 y/o presented with large subarachnoid hemorrhage due to ruptured PICA with resultant hydrocephalus. Admitted on 11/3, [place] on 11/4, vented until 11/12 when pt was trached. Also found to have multinodular goiter complicating respiratory failure so total thyroidectomy also completed on 11/12. Drains removed on 11/14. ATC on 11/15. Changed to 6 cuffledd on 11/17.  There is question of left lingual deviation in notes.  No data recorded Assessment / Plan / Recommendation CHL IP CLINICAL IMPRESSIONS 02/19/2020 Clinical Impression Pt was seen for MBS and was profoundly lethargic due to sedation. The pt has extremely abnormal pharyngeal and laryngeal anatomy with no upper airway or esophageal patency. Barium retained in upper pharynx until small portions trickled into what appears to be the vestibule and trachea. Tissue occludes all open spaces. Study d/c after one bolus given severity of dysphagia. Recommend long-term altnerative means of nutrition and further evaluation of pharyngeal and laryngeal anatomy.  SLP Visit Diagnosis Dysphagia, pharyngeal phase (R13.13) Attention and concentration deficit following -- Frontal lobe and executive function deficit following -- Impact on safety and function Severe aspiration risk;Risk for inadequate nutrition/hydration   CHL IP TREATMENT RECOMMENDATION 02/19/2020 Treatment Recommendations Therapy as outlined in treatment plan below   Prognosis 02/19/2020 Prognosis for Safe Diet Advancement  Guarded Barriers to Reach Goals Severity of deficits Barriers/Prognosis Comment -- CHL IP DIET RECOMMENDATION 02/19/2020 SLP Diet Recommendations NPO;Alternative means - long-term Liquid Administration via -- Medication Administration Via alternative means Compensations -- Postural Changes --   CHL IP OTHER RECOMMENDATIONS 02/19/2020 Recommended Consults -- Oral Care Recommendations Oral care QID Other Recommendations --   CHL IP FOLLOW UP RECOMMENDATIONS 02/19/2020 Follow up Recommendations Inpatient Rehab   CHL IP FREQUENCY AND DURATION 02/19/2020 Speech Therapy Frequency (ACUTE ONLY) min 2x/week Treatment Duration 2 weeks      CHL IP ORAL PHASE 02/19/2020 Oral Phase Impaired Oral - Pudding Teaspoon -- Oral - Pudding Cup -- Oral - Honey Teaspoon -- Oral - Honey Cup -- Oral - Nectar Teaspoon -- Oral - Nectar Cup -- Oral - Nectar Straw -- Oral - Thin Teaspoon -- Oral - Thin Cup Delayed oral transit;Holding of bolus Oral - Thin Straw -- Oral - Puree -- Oral - Mech Soft -- Oral - Regular -- Oral - Multi-Consistency -- Oral - Pill -- Oral Phase - Comment --  CHL IP PHARYNGEAL PHASE 02/19/2020 Pharyngeal Phase Impaired Pharyngeal- Pudding Teaspoon -- Pharyngeal -- Pharyngeal- Pudding Cup -- Pharyngeal -- Pharyngeal- Honey Teaspoon -- Pharyngeal -- Pharyngeal- Honey Cup -- Pharyngeal -- Pharyngeal- Nectar Teaspoon -- Pharyngeal -- Pharyngeal- Nectar Cup -- Pharyngeal -- Pharyngeal- Nectar Straw -- Pharyngeal -- Pharyngeal- Thin Teaspoon -- Pharyngeal -- Pharyngeal- Thin Cup Trace aspiration;Other (Comment) Pharyngeal -- Pharyngeal- Thin Straw -- Pharyngeal -- Pharyngeal- Puree -- Pharyngeal -- Pharyngeal- Mechanical Soft -- Pharyngeal -- Pharyngeal- Regular -- Pharyngeal -- Pharyngeal- Multi-consistency -- Pharyngeal -- Pharyngeal- Pill -- Pharyngeal -- Pharyngeal Comment --  No flowsheet data found. 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   Accelerated hypertension   H/O total thyroidectomy   Tracheostomy status ([place])  Ruptured cerebral aneurysm -Patient without PCP/chronic medical care who presented with ruptured aneurysm -She is POD 21 from clipping -She had post-operative SAH -She has completed nimodipine -Likely to need CIR  Tracheostomy -Failed extubation and so required trach -This may have been due in part to a large goiter, now s/o thyroidectomy (see below) -Trach currently with secretions, foul odor -Concern for current infection -Culture is pending -On Cefepime -CT ordered and is pending -This issue is being followed by ENT but reconsultation may be needed -Pressure injury reported at  throat  HTN -No home meds prior to admission -Currently on Cozaar, Labetolol standing -Also with prn IV hydralazine  Prediabetes -No known medical problems PTA -A1c on 11/3 was 6.2 -She is currently on Levemir and resistant scale SSI  Dysphagia -Failed swallow evaluation -Needs PEG for long-term alternative means of nutrition -IR consult requested -Also needs further evaluation of pharyngeal and laryngeal anatomy, per ST  Thyroidectomy -Patient with a large goiter that was thought to be contributing to airway compromise -s/p total thyroidectomy -Will need lifelong thyroid hormone replacement   Thank you for this consultation.  Our [facility] hospitalist team will follow the patient with you.   Time Spent: 56 minutes  Karmen Bongo M.D. [facility] 02/19/2020, 6:46 PM

## 2020-02-19 NOTE — Progress Notes (Signed)
Inpatient Rehab Admissions Coordinator:   Spoke to patient's daughter over the phone regarding rehab goals (supervision to min assist) and estimated length of stay (3-4 weeks).  Darrol Jump states that initial plan is for patient's sister to come from [place] when patient discharges from rehab to stay with her for a while, then patient will transition to [facility] home.  We discussed cost of rehab, and screen for Medicaid, which Darrol Jump is agreeable to.  Note plans for MBSS today, which was delayed due to sedation.  Will follow for plans for diet versus g-tube and potential admit to CIR when medically ready and bed available.  Shann Medal, PT, DPT Admissions Coordinator [phone] 02/19/20  12:24 PM

## 2020-02-19 NOTE — Progress Notes (Signed)
NAME:  Stacy Moran, MRN:  [id number], DOB:  [birth year], LOS: 4 ADMISSION DATE:  01/30/2020, CONSULTATION DATE:  01/30/20 REFERRING MD:  Ralene Ok, CHIEF COMPLAINT:  Headache   Brief History   64 y/o presented with large subarachnoid hemorrhage due to ruptured PICA with resultant hydrocephalus.    Past Medical History  No known [facility] Events   11/03 Admitted 11/04 craniotomy with clipping of left PICA 11/12 total thyroidectomy, tracheostomy 11/13 start ABx for fever and tracheobronchitis 11/16 Now 11 days post clipping.  Drain stopped yesterday so left clamped.  Was interactive with PT yesterday. This morning, agitated and complaining of back pain. More somnolent following fentanyl this morning. Stable from neuro check  11/21 pulm called back again;  trach bleeding. we called ENT 11/23 subglottic obstruction which is new observed on MBS-->CT neck ordered to r/o infection. Also abx started. As she failed MBS IR consulted for PEG. Should be last day for nimodipine. We asked IM to assist w. Care  Consults:  ENT Constance Holster)  Procedures:  11/03 ETT >>11/09 11/03 EVD >> 11/09 ETT >> 11/12 11/12 Tracheostomy >>  Significant Diagnostic Tests:  11/3 CT head > large volume subarachnoid related to ruptured left PICA aneurysm, intraventricular reflux with hydrocephalus, chronic small vessel disease, chronic small vessel disease 11/9 H&N U/S > 2.3x2.3x2.4 mass in the left neck along the left thyroid lobe 11/17 CT head > near   Micro Data:  11/03 SARS2/ Flu > neg 11/08 respiratory culture >> negative 11/08 blood culture >> 11/11 urine culture >> multiple species 11/12 MRSA PCR >> negative 11/12 sputum: staph aureus, Citrobacter and Haemophilus parainfluenza (B lactamase +)   Antimicrobials:  Zosyn 11/13 >> 11/14 Ceftriaxone 11/14 - 11/20  Interim history/subjective:  No oral access, Bps elevated. Stable report of oozing from excoriations from trach site.  Objective    Blood pressure (Abnormal) 162/82, pulse (Abnormal) 105, temperature 99.6 F (37.6 C), temperature source Oral, resp. rate 20, height 5\' 9"  (1.753 m), weight 66.8 kg, SpO2 96 %.    FiO2 (%):  [21 %] 21 %   Intake/Output Summary (Last 24 hours) at 02/19/2020 1349 Last data filed at 02/19/2020 1310 Gross per 24 hour  Intake 3075.48 ml  Output 3600 ml  Net -524.52 ml   Filed Weights   02/16/20 0500 02/18/20 0349 02/19/20 0500  Weight: 68.8 kg 68.3 kg 66.8 kg    Examination:  General this is a 64 year old female resting in bed. She is in no distress. Did just have attempted MBS where she has significant upper airway obstruction.  HENT #6 cuffless trach. Copious foul smelling thick secretions from trach  pulm course scattered rhonchi Card RRR abd soft not tender Ext warm and dry  Neuro awake and moves all ext    Resolved Hospital Problem list    Tracheobronchitis (Resolved) Beta lactamase + H flu, Citrobacter and S.aureus are susceptible.: Patient completed 7 days treatment with ceftriaxone Trach site bleeding  11/22 consult for bleeding around trach, TXA and epi nebs ordered per prelim recs   Assessment & Plan:   [facility] s/p clipping if ruptured left PICA aneurysm  post-op day 21 Plan Cont supportive care Completes nimodipine today  CIR candidate at some point?   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  C/b poly-microbial tracheobronchitis and what  appears to be on going sub-glottic obstruction (observed by MBS films) not sure if this is post-surgical change, old hematoma OR acute/subacute infection.  -has copious tracheal secretions -I am most concerned about soft tissue infection in neck and associated tracheobronchitis Plan No change in current trach management   Cont routine trach care Awaiting sputum culture sent 11/22 Will get CT neck to look for abscess or infection  Start empiric cefepime May need ENT to come back and evaluate again   Dysphagia Plan Will need PEG Consulted IR    HTN Plan Cont labetolol , losartan, and norvasc  Diabetes Plan Cont ssi and lantus    We will sign off. Have requested IM service to take over for medical support. Trach issues need to be forwarded to ENT    Erick Colace ACNP-BC [place] Pager # [phone] OR # [phone] if no answer

## 2020-02-20 ENCOUNTER — Inpatient Hospital Stay (HOSPITAL_COMMUNITY): Payer: Self-pay

## 2020-02-20 LAB — GLUCOSE, CAPILLARY
Glucose-Capillary: 81 mg/dL (ref 70–99)
Glucose-Capillary: 86 mg/dL (ref 70–99)
Glucose-Capillary: 90 mg/dL (ref 70–99)
Glucose-Capillary: 96 mg/dL (ref 70–99)
Glucose-Capillary: 80 mg/dL (ref 70–99)
Glucose-Capillary: 77 mg/dL (ref 70–99)

## 2020-02-20 LAB — CBC WITH DIFFERENTIAL/PLATELET
Abs Immature Granulocytes: 0.02 10*3/uL (ref 0.00–0.07)
Basophils Absolute: 0 10*3/uL (ref 0.0–0.1)
Basophils Relative: 0 %
Eosinophils Absolute: 0 10*3/uL (ref 0.0–0.5)
Eosinophils Relative: 0 %
HCT: 33.6 % — ABNORMAL LOW (ref 36.0–46.0)
Hemoglobin: 10.4 g/dL — ABNORMAL LOW (ref 12.0–15.0)
Immature Granulocytes: 0 %
Lymphocytes Relative: 9 %
Lymphs Abs: 0.7 10*3/uL (ref 0.7–4.0)
MCH: 25.9 pg — ABNORMAL LOW (ref 26.0–34.0)
MCHC: 31 g/dL (ref 30.0–36.0)
MCV: 83.8 fL (ref 80.0–100.0)
Monocytes Absolute: 0.8 10*3/uL (ref 0.1–1.0)
Monocytes Relative: 10 %
Neutro Abs: 5.9 10*3/uL (ref 1.7–7.7)
Neutrophils Relative %: 81 %
Platelets: 275 10*3/uL (ref 150–400)
RBC: 4.01 MIL/uL (ref 3.87–5.11)
RDW: 16.6 % — ABNORMAL HIGH (ref 11.5–15.5)
WBC: 7.4 10*3/uL (ref 4.0–10.5)
nRBC: 0 % (ref 0.0–0.2)

## 2020-02-20 LAB — COMPREHENSIVE METABOLIC PANEL
ALT: 39 U/L (ref 0–44)
AST: 26 U/L (ref 15–41)
Albumin: 2.6 g/dL — ABNORMAL LOW (ref 3.5–5.0)
Alkaline Phosphatase: 76 U/L (ref 38–126)
Anion gap: 14 (ref 5–15)
BUN: 9 mg/dL (ref 8–23)
CO2: 22 mmol/L (ref 22–32)
Calcium: 8.6 mg/dL — ABNORMAL LOW (ref 8.9–10.3)
Chloride: 104 mmol/L (ref 98–111)
Creatinine, Ser: 0.66 mg/dL (ref 0.44–1.00)
GFR, Estimated: >60 mL/min (ref >60)
Glucose, Bld: 99 mg/dL (ref 70–99)
Potassium: 3.3 mmol/L — ABNORMAL LOW (ref 3.5–5.1)
Sodium: 140 mmol/L (ref 135–145)
Total Bilirubin: 1.5 mg/dL — ABNORMAL HIGH (ref 0.3–1.2)
Total Protein: 6.1 g/dL — ABNORMAL LOW (ref 6.5–8.1)

## 2020-02-20 LAB — AEROBIC CULTURE W GRAM STAIN (SUPERFICIAL SPECIMEN)

## 2020-02-20 IMAGING — CT CT ABDOMEN W/O CM
3 of 5 series · 16 of 46 positions shown, 18 images · non-contrast
Comparison: None.

CLINICAL DATA: Evaluate anatomy prior to potential percutaneous
gastrostomy tube placement.

EXAM:
CT ABDOMEN WITHOUT CONTRAST
TECHNIQUE: Multidetector CT imaging of the abdomen was performed following the
standard protocol without IV contrast.

[Series 3: ap without · axial · non-contrast · 0.63mm/px · z∈[-804,-579]mm · 10 of 56 slices shown, 12 images]
[im 6/56  soft-tissue]
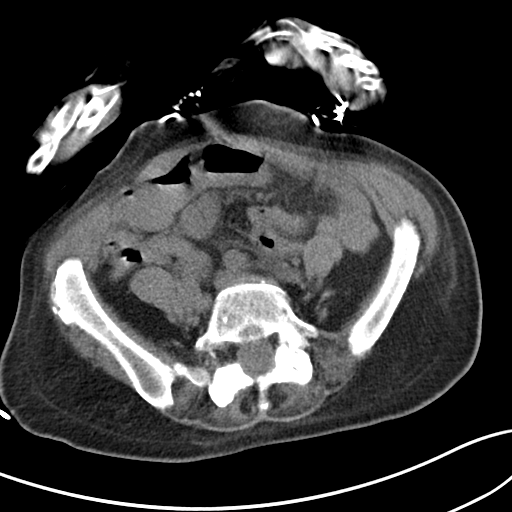
[im 6/56  bone]
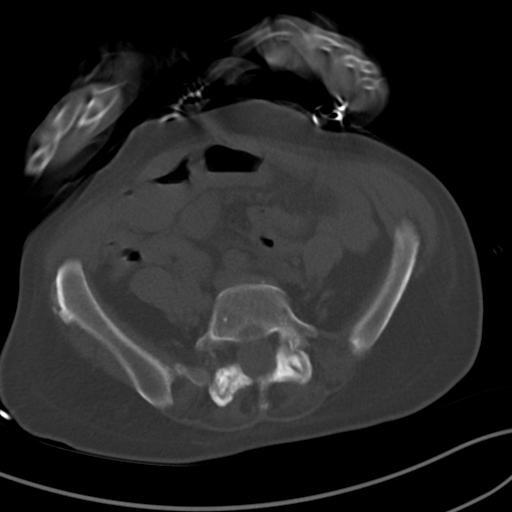
[im 11/56  soft-tissue]
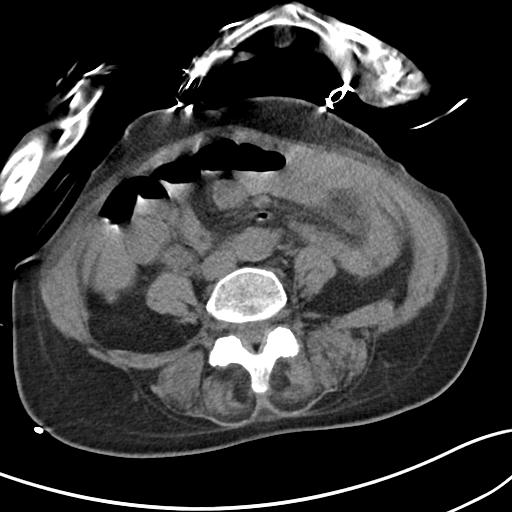
[im 16/56  soft-tissue]
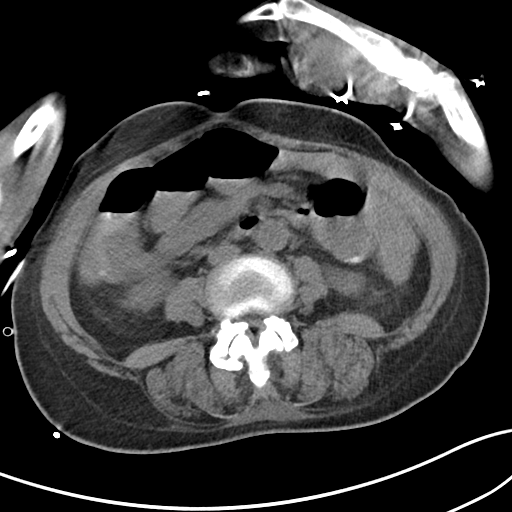
[im 21/56  soft-tissue]
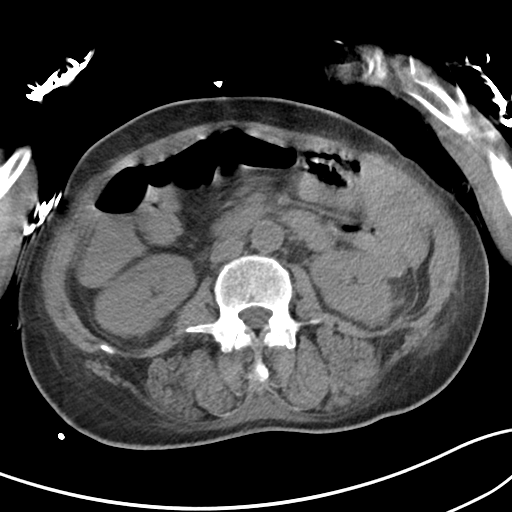
[im 26/56  soft-tissue]
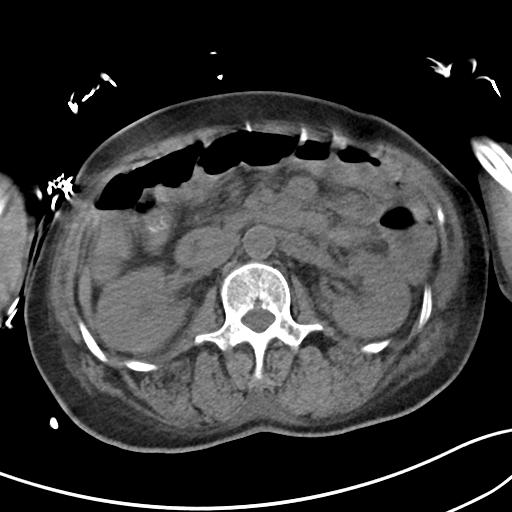
[im 31/56  soft-tissue]
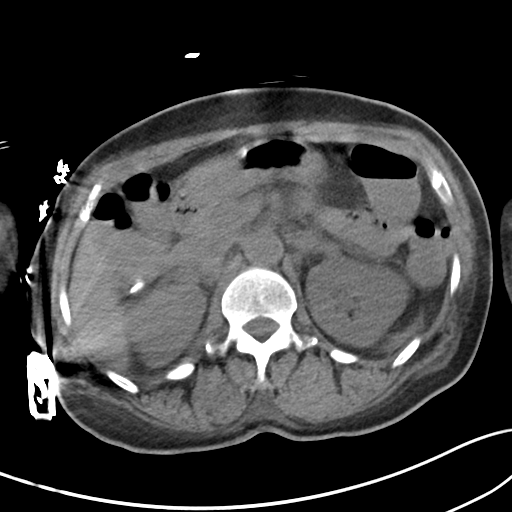
[im 36/56  soft-tissue]
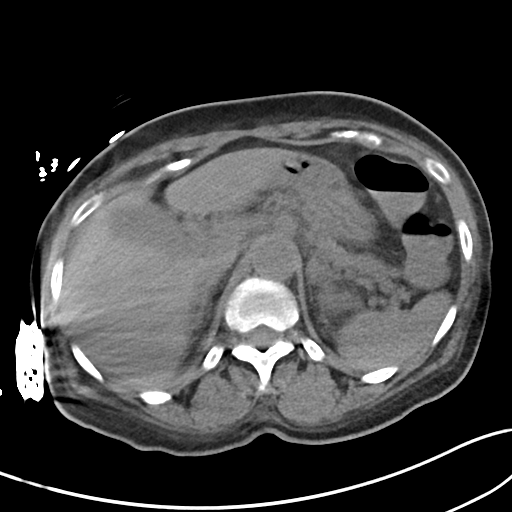
[im 41/56  soft-tissue]
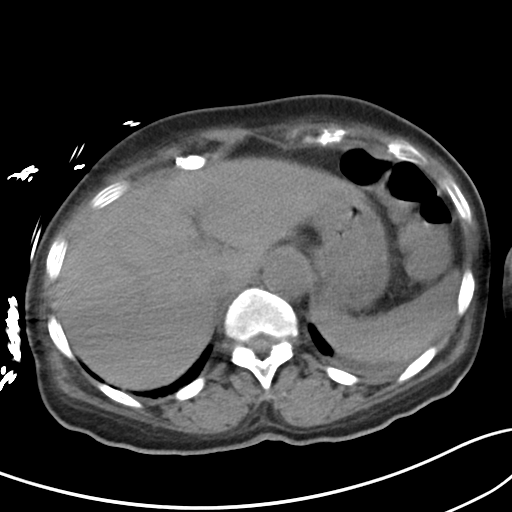
[im 46/56  soft-tissue]
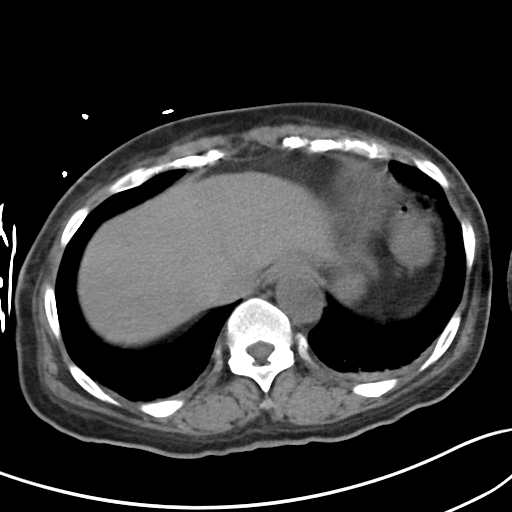
[im 46/56  bone]
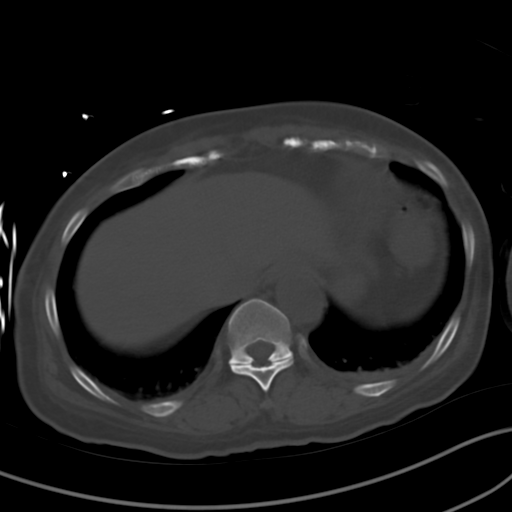
[im 51/56  soft-tissue]
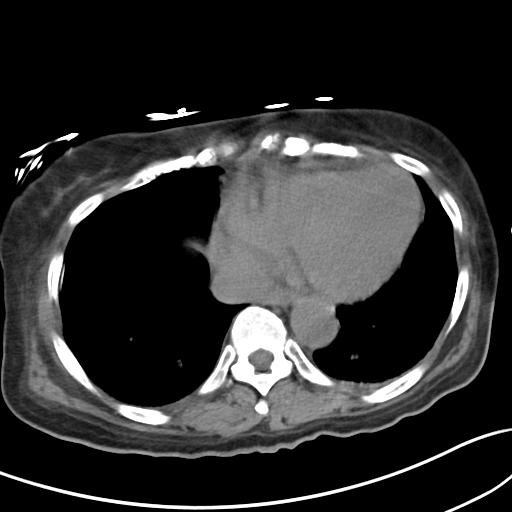

[Series 6: cor · coronal · 0.54mm/px · 3 of 80 slices shown]
[im 27/80  soft-tissue]
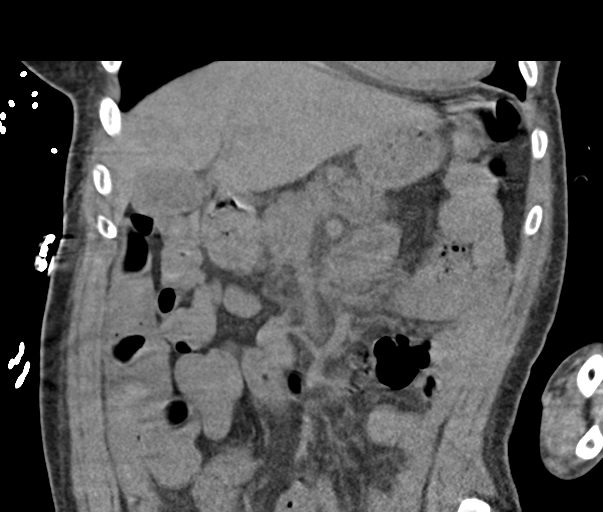
[im 36/80  soft-tissue]
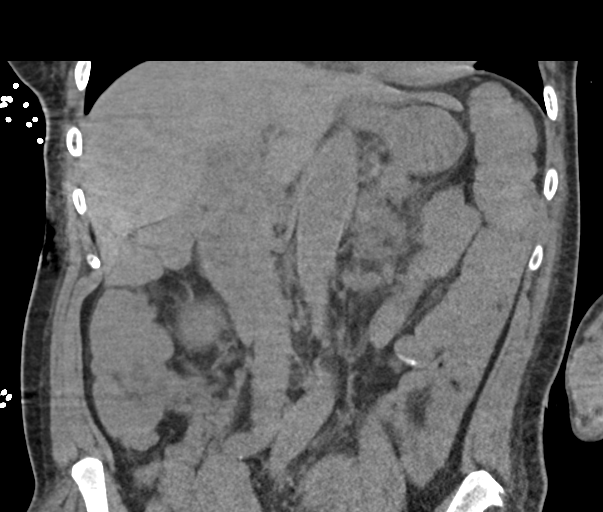
[im 44/80  soft-tissue]
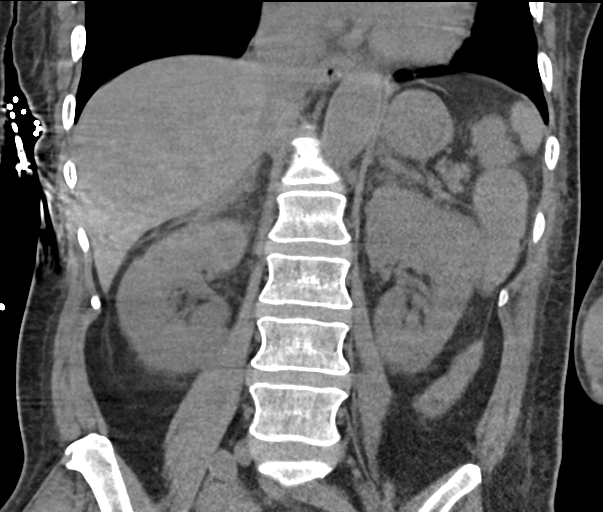

[Series 8: lungs · axial · 0.63mm/px · z∈[-634,-612]mm · 3 of 46 slices shown]
[im 6/46  soft-tissue]
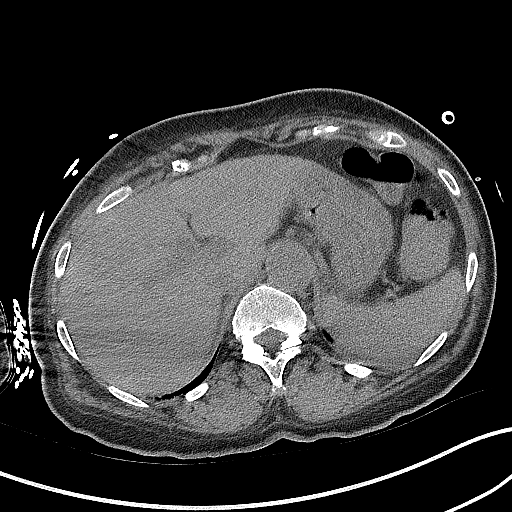
[im 12/46  soft-tissue]
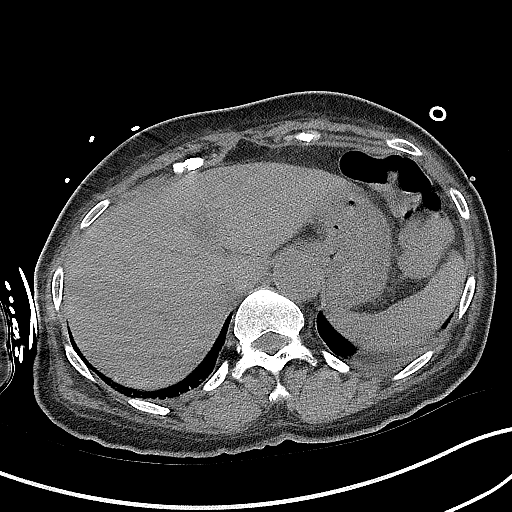
[im 17/46  soft-tissue]
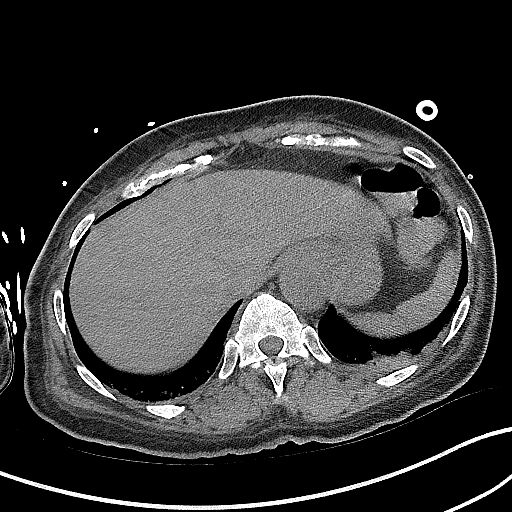

[16 of 46 positions shown; findings below may reference images not displayed]

FINDINGS: The lack of intravenous contrast limits the ability to evaluate
solid abdominal organs.

Lower chest: Limited visualization of lower thorax demonstrates
minimal dependent subpleural ground-glass atelectasis. No pleural
effusion.

Cardiomegaly. Trace amount of pericardial fluid, potentially
physiologic.

Hepatobiliary: Normal hepatic contour. Normal noncontrast appearance
of the gallbladder given degree distention, though note, evaluation
further degraded secondary to significant streak artifact from
adjacent monitoring apparatus. No ascites.

Pancreas: The pancreas appears atrophic.

Spleen: Normal noncontrast appearance of the spleen.

Adrenals/Urinary Tract: Normal noncontrast appearance of the
bilateral kidneys. No renal stones. No urine obstruction or
perinephric stranding.

Mild thickening of the crux of left adrenal gland without discrete
nodule. Normal noncontrast appearance of the right adrenal gland.

The urinary bladder was not imaged.

Stomach/Bowel: The anterior wall of the mid/distal aspect of the
stomach is well apposed against the ventral wall of the abdomen
without interposition of either the hepatic parenchyma or the
transverse colon and will likely be improved with gastric
insufflation (representative image 24, series 3).

Moderate liquid stool burden. No evidence of enteric obstruction.
Ingested radiopaque debris is seen at the level of the hepatic
flexure of the colon. Normal noncontrast appearance of the appendix.
No pneumoperitoneum, pneumatosis or portal venous gas.

Vascular/Lymphatic: Normal caliber of the abdominal aorta.

No bulky retroperitoneal or mesenteric lymphadenopathy on this
noncontrast examination.

Other: Diffuse body wall anasarca, most conspicuous about the
midline of the low back.

Musculoskeletal: No acute or aggressive osseous abnormalities.
IMPRESSION: 1. Gastric anatomy amenable to potential percutaneous gastrostomy
tube placement as indicated.
2. Cardiomegaly.

## 2020-02-20 IMAGING — CT CT NECK W/ CM
5 of 6 series · 14 of 35 positions shown, 16 images · IV contrast (APPLIED)
Comparison: None.

CLINICAL DATA: Post thyroidectomy with tracheostomy possible
infection

EXAM:
CT NECK WITH CONTRAST
TECHNIQUE: Multidetector CT imaging of the neck was performed using the
standard protocol following the bolus administration of intravenous
contrast.
CONTRAST:  75mL OMNIPAQUE IOHEXOL 300 MG/ML  SOLN

[Series 3: axial neck · axial · 0.53mm/px · z∈[-397,-311]mm · 2 of 130 slices shown]
[im 44/130  bone]
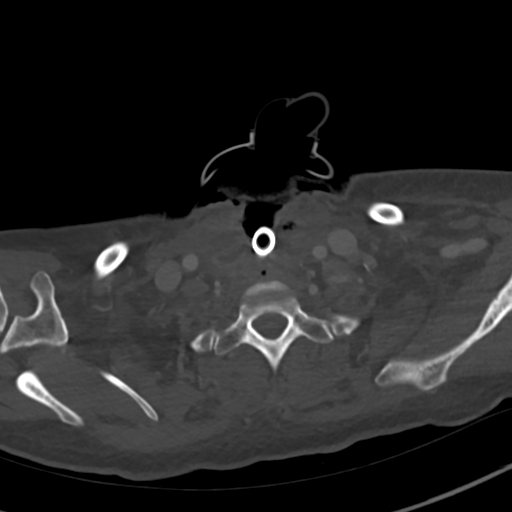
[im 87/130  bone]
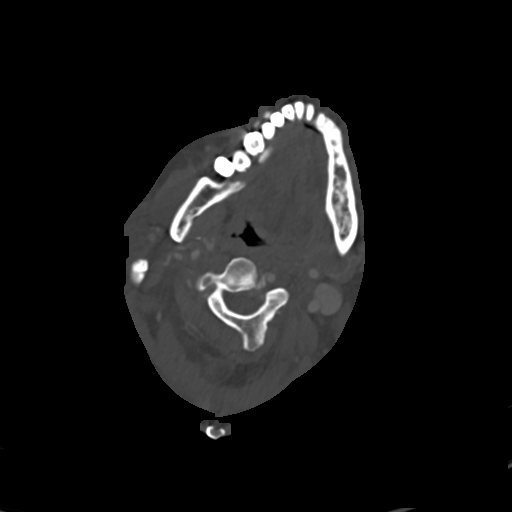

[Series 4: axial bone · axial · 0.53mm/px · z∈[-397,-311]mm · 2 of 130 slices shown]
[im 44/130  bone]
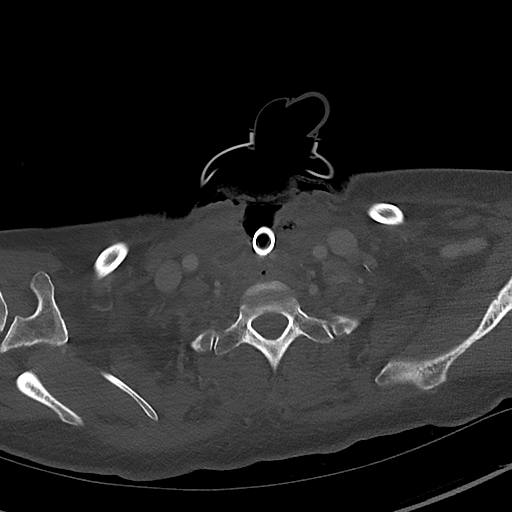
[im 87/130  bone]
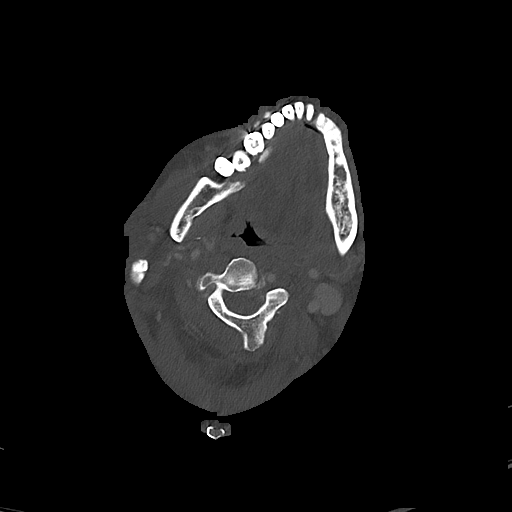

[Series 5: sag neck · sagittal · 0.45mm/px · 5 of 138 slices shown, 6 images]
[im 46/138  bone]
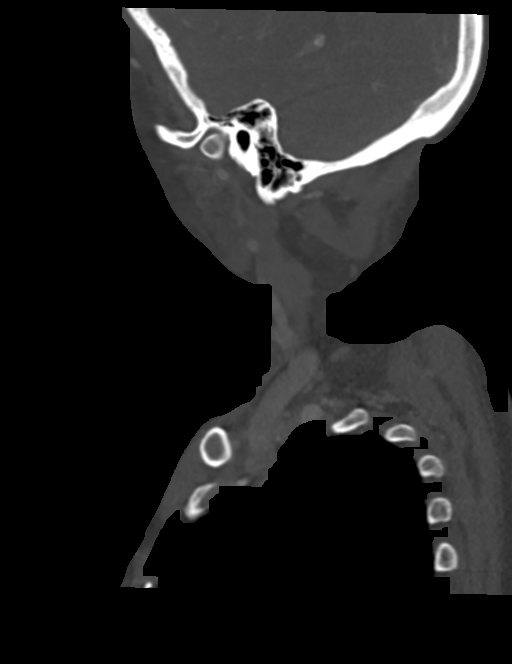
[im 58/138  bone]
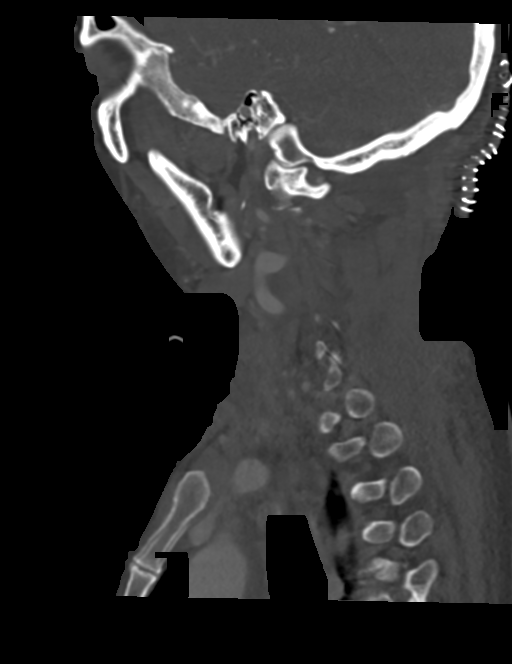
[im 69/138  soft-tissue]
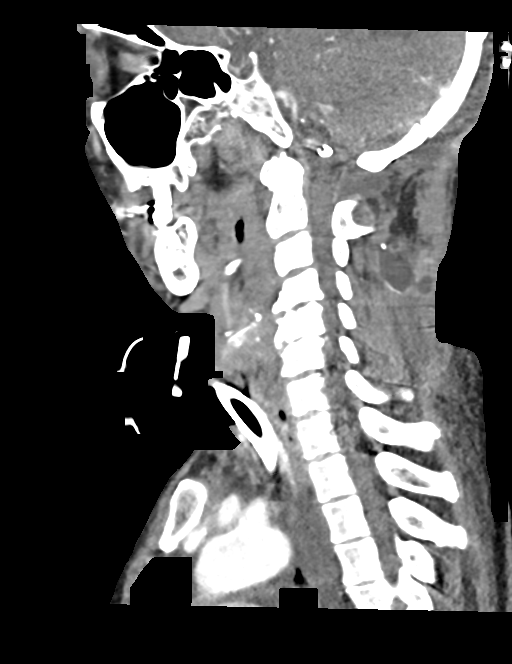
[im 69/138  bone]
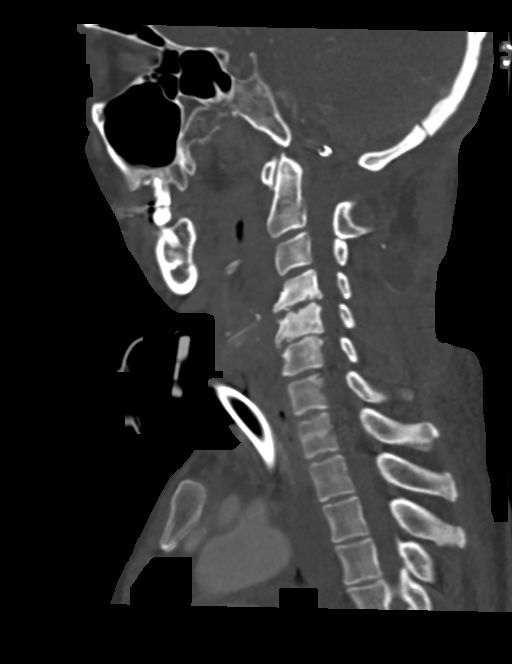
[im 80/138  bone]
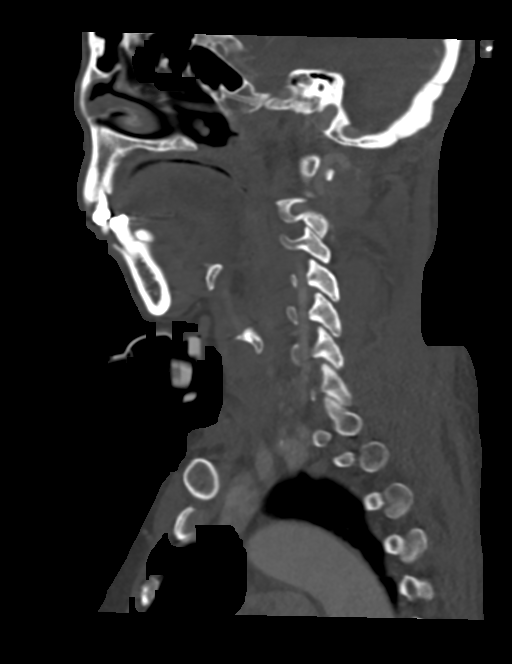
[im 92/138  bone]
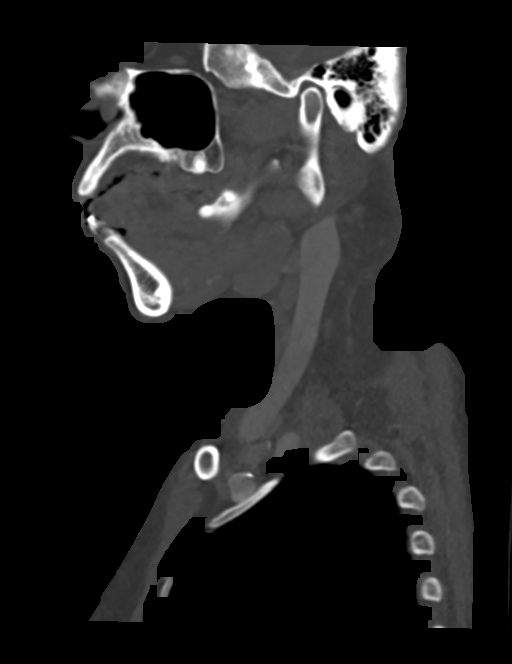

[Series 6: cor neck · coronal · 0.55mm/px · 3 of 117 slices shown]
[im 24/117  bone]
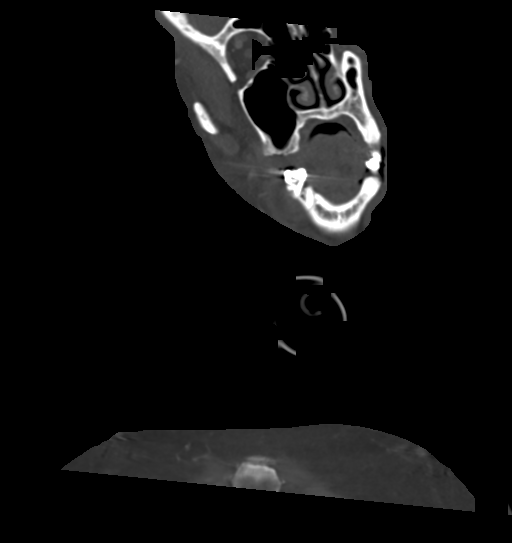
[im 47/117  bone]
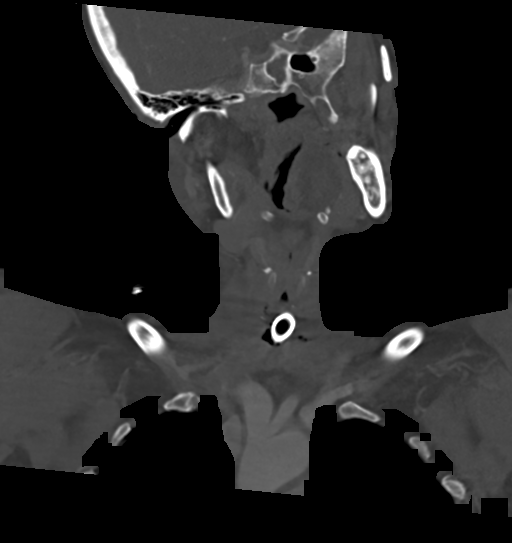
[im 70/117  bone]
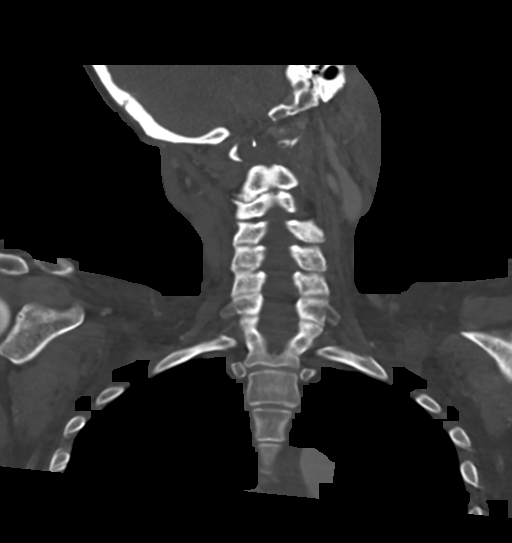

[Series 7: ax oropharynx · axial · 0.45mm/px · z∈[-422,-327]mm · 2 of 144 slices shown, 3 images]
[im 48/144  soft-tissue]
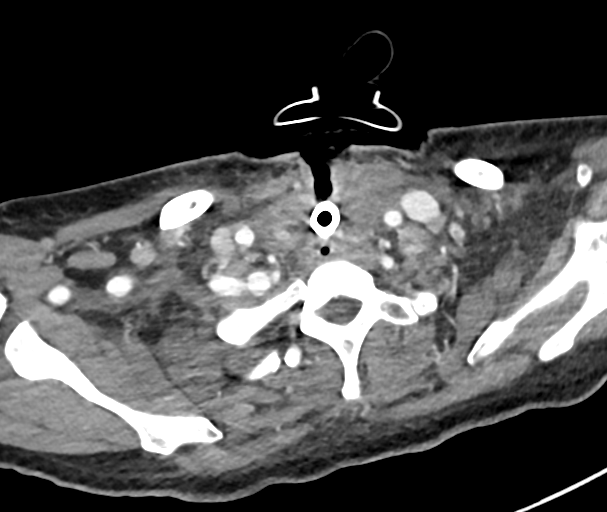
[im 48/144  bone]
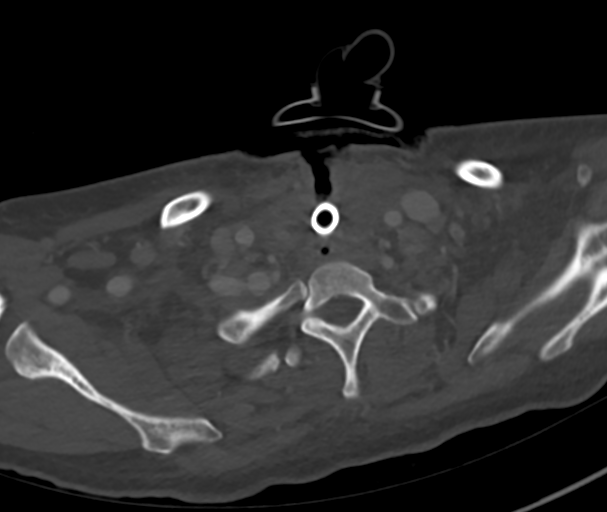
[im 96/144  bone]
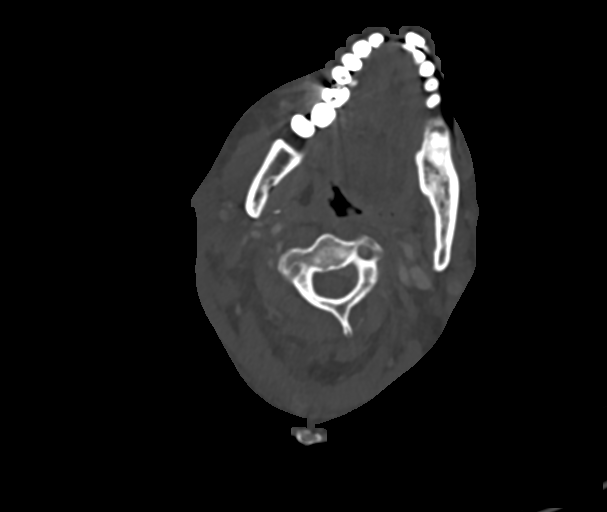

[14 of 35 positions shown; findings below may reference images not displayed]

FINDINGS: Pharynx and larynx: Tracheostomy device present. Irregularity of the
defect margins. Subglottic airway is patent. Mild laryngeal and
pharyngeal wall thickening may reflect edema.

Salivary glands: Unremarkable.

Thyroid: Post thyroidectomy. Within the right thyroidectomy bed
there is a peripherally enhancing, irregularly marginated fluid
collection with foci of air likely due to communication with the
tracheostomy site (series 7, image 88). Thicker hyperdense or
enhancing tissue on the seen on series 7, image 85.

Lymph nodes: No enlarged nodes identified.

Vascular: Major neck vessels are patent.

Limited intracranial: No abnormal enhancement. Left suboccipital
craniotomy. Fluid collection superficial to the craniotomy extending
posterior to the C1 arch, which is also been partially resected.
This was present on prior head imaging. Right cerebral convexity
subdural hematoma is partially imaged and likely similar to prior
head CT.

Visualized orbits: Left ocular prosthesis.  Otherwise unremarkable.

Mastoids and visualized paranasal sinuses: No significant
opacification.

Skeleton: Cervical spine degenerative changes, greatest at C4-C5 and
C5-C6.

Upper chest: No apical lung mass or consolidation.

Other: None.
IMPRESSION: Post thyroidectomy with peripherally enhancing, irregularly
marginated fluid collection within the surgical bed on the right
back communicates with the tracheostomy site. There is irregularity
of the tracheostomy defect margins. Superimposed infection is
therefore possible as suspected.

## 2020-02-20 MED ORDER — POTASSIUM CHLORIDE 10 MEQ/100ML IV SOLN
10.0000 meq | INTRAVENOUS | Status: AC
  Administered 2020-02-20 (×4): 10 meq via INTRAVENOUS
  Filled 2020-02-20 (×4): qty 100

## 2020-02-20 MED ORDER — HYDRALAZINE HCL 25 MG PO TABS
25.0000 mg | ORAL_TABLET | Freq: Three times a day (TID) | ORAL | Status: DC
  Administered 2020-02-23 – 2020-03-14 (×59): 25 mg
  Filled 2020-02-20 (×60): qty 1

## 2020-02-20 MED ORDER — IOHEXOL 300 MG/ML  SOLN
75.0000 mL | Freq: Once | INTRAMUSCULAR | Status: AC | PRN
  Administered 2020-02-20: 75 mL via INTRAVENOUS

## 2020-02-20 NOTE — Consult Note (Signed)
Chief Complaint: Patient was seen in consultation today for percutaneous gastrostomy placement.  Referring Physician(s): Salvadore Dom, NP  Supervising Physician: Arne Cleveland  Patient Status: [facility] - In-pt  History of Present Illness: Stacy Moran is a 64 y.o. female with no known past medical history prior to arrival who presented to the ED on 01/30/20 with complaints of headache. While in the ED she became progressively more obtunded and required intubation. 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 She required prolonged intubation with enlarged thyroid that ultimately required total thyroidectomy and tracheostomy placement on 11/12. She has recently failed swallow evaluation and IR has been asked to place a percutaneous gastrostomy tube for continued nutritional needs.  Patient seen in room, staff at bedside providing trach care. She is in restraints/mittens, no NG noted. She is alert and tracks me somewhat during exam but does not respond to questions. No family at bedside, attempted to call daughter [name] without answer.   History reviewed. No pertinent past medical history.  Past Surgical History:  Procedure Laterality Date  . CRANIOTOMY Left 01/30/2020   Procedure: LEFT FAR LATERAL CRANIOTOMY FOR ANEURYSM CLIPPING;  Surgeon: [name], MD;  Location: [place];  Service: Neurosurgery;  Laterality: Left;  . IR ANGIO INTRA EXTRACRAN SEL INTERNAL CAROTID BILAT MOD SED  01/30/2020  . IR ANGIO VERTEBRAL SEL VERTEBRAL UNI L MOD SED  01/30/2020  . RADIOLOGY WITH ANESTHESIA N/A 01/30/2020   Procedure: IR WITH ANESTHESIA;  Surgeon: [name], MD;  Location: [place];  Service: Radiology;  Laterality: N/A;  . THYROIDECTOMY N/A 02/08/2020   Procedure: THYROIDECTOMY;  Surgeon: [name], MD;  Location: [place];  Service: ENT;  Laterality: N/A;  . TRACHEOSTOMY TUBE PLACEMENT N/A 02/08/2020   Procedure: TRACHEOSTOMY;  Surgeon: [name],  MD;  Location: [place];  Service: ENT;  Laterality: N/A;    Allergies: Patient has no known allergies.  Medications: Prior to Admission medications   Not on File     History reviewed. No pertinent family history.  Social History   Socioeconomic History  . Marital status: Unknown    Spouse name: Not on file  . Number of children: Not on file  . Years of education: Not on file  . Highest education level: Not on file  Occupational History  . Not on file  Tobacco Use  . Smoking status: Not on file  Substance and Sexual Activity  . Alcohol use: Not on file  . Drug use: Not on file  . Sexual activity: Not on file  Other Topics Concern  . Not on file  Social History Narrative  . Not on file   Social Determinants of Health   Financial Resource Strain:   . Difficulty of Paying Living Expenses: Not on file  Food Insecurity:   . Worried About Charity fundraiser in the Last Year: Not on file  . Ran Out of Food in the Last Year: Not on file  Transportation Needs:   . Lack of Transportation (Medical): Not on file  . Lack of Transportation (Non-Medical): Not on file  Physical Activity:   . Days of Exercise per Week: Not on file  . Minutes of Exercise per Session: Not on file  Stress:   . Feeling of Stress : Not on file  Social Connections:   . Frequency of Communication with Friends and Family: Not on file  . Frequency of Social Gatherings with Friends and Family: Not on file  . Attends Religious  Services: Not on file  . Active Member of Clubs or Organizations: Not on file  . Attends Archivist Meetings: Not on file  . Marital Status: Not on file     Review of Systems: A 12 point ROS discussed and pertinent positives are indicated in the HPI above.  All other systems are negative.  Review of Systems  Unable to perform ROS: Patient nonverbal    Vital Signs: BP (!) 164/80 (BP Location: Right Arm) Comment: recheck after Hydralazine  Pulse 99   Temp 98.9 F  (37.2 C) (Oral)   Resp 20   Ht 5\' 9"  (1.753 m)   Wt 143 lb 8.3 oz (65.1 kg)   SpO2 100%   BMI 21.19 kg/m   Physical Exam Vitals and nursing note reviewed.  Constitutional:      General: She is not in acute distress. HENT:     Head: Normocephalic.  Cardiovascular:     Rate and Rhythm: Normal rate and regular rhythm.  Pulmonary:     Breath sounds: Rhonchi present.     Comments: Tracheostomy with small amount of bleeding noted Abdominal:     General: There is no distension.     Palpations: Abdomen is soft.  Skin:    General: Skin is warm and dry.  Neurological:     Mental Status: She is alert. Mental status is at baseline.      MD Evaluation Airway: Other (comments) (Tracheostomy) Heart: WNL Abdomen: WNL Chest/ Lungs: WNL ASA  Classification: 3 Mallampati/Airway Score:  (Tracheostomy)   Imaging: CT ABDOMEN WO CONTRAST  Result Date: 02/20/2020 CLINICAL DATA:  Evaluate anatomy prior to potential percutaneous gastrostomy tube placement. EXAM: CT ABDOMEN WITHOUT CONTRAST TECHNIQUE: Multidetector CT imaging of the abdomen was performed following the standard protocol without IV contrast. COMPARISON:  None. FINDINGS: The lack of intravenous contrast limits the ability to evaluate solid abdominal organs. Lower chest: Limited visualization of lower thorax demonstrates minimal dependent subpleural ground-glass atelectasis. No pleural effusion. Cardiomegaly. Trace amount of pericardial fluid, potentially physiologic. Hepatobiliary: Normal hepatic contour. Normal noncontrast appearance of the gallbladder given degree distention, though note, evaluation further degraded secondary to significant streak artifact from adjacent monitoring apparatus. No ascites. Pancreas: The pancreas appears atrophic. Spleen: Normal noncontrast appearance of the spleen. Adrenals/Urinary Tract: Normal noncontrast appearance of the bilateral kidneys. No renal stones. No urine obstruction or perinephric  stranding. Mild thickening of the crux of left adrenal gland without discrete nodule. Normal noncontrast appearance of the right adrenal gland. The urinary bladder was not imaged. Stomach/Bowel: The anterior wall of the mid/distal aspect of the stomach is well apposed against the ventral wall of the abdomen without interposition of either the hepatic parenchyma or the transverse colon and will likely be improved with gastric insufflation (representative image 24, series 3). Moderate liquid stool burden. No evidence of enteric obstruction. Ingested radiopaque debris is seen at the level of the hepatic flexure of the colon. Normal noncontrast appearance of the appendix. No pneumoperitoneum, pneumatosis or portal venous gas. Vascular/Lymphatic: Normal caliber of the abdominal aorta. No bulky retroperitoneal or mesenteric lymphadenopathy on this noncontrast examination. Other: Diffuse body wall anasarca, most conspicuous about the midline of the low back. Musculoskeletal: No acute or aggressive osseous abnormalities. IMPRESSION: 1. Gastric anatomy amenable to potential percutaneous gastrostomy tube placement as indicated. 2. Cardiomegaly. Electronically Signed   By: Sandi Mariscal M.D.   On: 02/20/2020 09:52   CT Angio Head W or Wo Contrast  Result Date: 01/30/2020 CLINICAL DATA:  Sudden onset headache. EXAM: CT ANGIOGRAPHY HEAD AND NECK TECHNIQUE: Multidetector CT imaging of the head and neck was performed using the standard protocol during bolus administration of intravenous contrast. Multiplanar CT image reconstructions and MIPs were obtained to evaluate the vascular anatomy. Carotid stenosis measurements (when applicable) are obtained utilizing NASCET criteria, using the distal internal carotid diameter as the denominator. CONTRAST:  66mL OMNIPAQUE IOHEXOL 350 MG/ML SOLN COMPARISON:  Contemporaneous noncontrast head CT FINDINGS: CTA NECK FINDINGS Aortic arch: No acute finding.  Three vessel branching. Right carotid  system: Vessels are smooth and widely patent. Left carotid system: Vessels are smooth and widely patent. Vertebral arteries: Left vertebral artery dominance. No proximal subclavian stenosis. The vertebral arteries are smooth and widely patent. Skeleton: Advanced cervical spine degeneration. Dental caries with molar periapical erosions. Other neck: Goiter. Upper chest: Masslike appearance left of the upper thoracic esophagus which could be intramural or extra serosal, 2.3 cm in diameter on axial slices and 2.8 cm craniocaudal on coronal reformats. Review of the MIP images confirms the above findings CTA HEAD FINDINGS Anterior circulation: Atheromatous plaque along both carotid siphons without flow reducing stenosis. No branch occlusion, generalized beading, or aneurysm seen. Posterior circulation: Left dominant vertebral artery. Projecting posteriorly towards the medulla is a left PICA aneurysm which has a mildly irregular sac shape and measures up to 3 mm base to dome. Subjective moderate narrowing of the right V4 segment just before the PICA origin. The basilar is diffusely patent. No PCA branch occlusion or aneurysm. Venous sinuses: Negative for the arterial phase. The superior ophthalmic veins show prominent size and density, suggesting increase in intracranial pressure. Anatomic variants: As above Review of the MIP images confirms the above findings IMPRESSION: 1. Ruptured 3 mm left PICA aneurysm. 2. The left vertebral artery is dominant and diffusely patent. Moderate right V4 segment stenosis. 3. 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 After recovery, recommend chest CT with contrast. 4. No stenosis or significant atherosclerosis in the neck. 5. 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 Electronically Signed   By: Monte Fantasia M.D.   On: 01/30/2020 05:43   DG Chest 1 View  Result Date:  02/05/2020 CLINICAL DATA:  Check endotracheal tube placement EXAM: CHEST  1 VIEW COMPARISON:  01/30/2020 FINDINGS: Cardiac shadow is stable. Endotracheal tube and gastric catheter are noted in satisfactory position. The lungs are clear bilaterally. No acute bony abnormality is seen. IMPRESSION: Tubes and lines in satisfactory position. No acute abnormality noted. Electronically Signed   By: Inez Catalina M.D.   On: 02/05/2020 11:15   DG Abd 1 View  Result Date: 02/15/2020 CLINICAL DATA:  NG tube placement EXAM: ABDOMEN - 1 VIEW COMPARISON:  02/11/2020 FINDINGS: Feeding tube is in place with the tip in the mid to distal stomach. Gaseous distention of the right colon. No evidence of bowel obstruction. No organomegaly or free air. IMPRESSION: Feeding tube tip in the mid to distal stomach. Electronically Signed   By: Rolm Baptise M.D.   On: 02/15/2020 18:56   DG Abd 1 View  Result Date: 02/03/2020 CLINICAL DATA:  OG tube placement EXAM: ABDOMEN - 1 VIEW COMPARISON:  None. FINDINGS: The tip of the OG tube projects over the gastric body. The tip is pointed distally. The visualized bowel gas pattern is unremarkable. IMPRESSION: OG tube projects over the stomach. Electronically Signed   By: Constance Holster M.D.   On: 02/03/2020 03:03  CT HEAD WO CONTRAST  Result Date: 02/15/2020 CLINICAL DATA:  Headache.  Intracranial hemorrhage suspected EXAM: CT HEAD WITHOUT CONTRAST TECHNIQUE: Contiguous axial images were obtained from the base of the skull through the vertex without intravenous contrast. COMPARISON:  Three days ago FINDINGS: Brain: Subdural hemorrhage along the right cerebral convexity which has not clearly increased from before, 7 mm in maximal thickness. Intraventricular hemorrhage is no longer seen. The EVD has been removed with stable ventricular volume. Small volume subarachnoid hemorrhage along the left sylvian fissure primarily. No evidence of recurrent hemorrhage. Remote right basal ganglia  perforator infarct. Vascular: Left PICA aneurysm clipping. Skull: Unremarkable left suboccipital craniotomy. Fluid collection superficial to the craniotomy and C1 ring resection is non progressed. Sinuses/Orbits: Left enucleation. IMPRESSION: 1. No recurrent hemorrhage or other acute finding. 2. Decreasing subarachnoid/intraventricular hemorrhage. 3. Subdural hematoma on the right which appears slightly fuller but measures similar to before. 4. Stable ventricular volume after EVD removal. Electronically Signed   By: Monte Fantasia M.D.   On: 02/15/2020 04:56   CT HEAD WO CONTRAST  Result Date: 02/12/2020 CLINICAL DATA:  Follow-up subarachnoid hemorrhage EXAM: CT HEAD WITHOUT CONTRAST TECHNIQUE: Contiguous axial images were obtained from the base of the skull through the vertex without intravenous contrast. COMPARISON:  Head CT from 13 days ago FINDINGS: Brain: Interval craniotomy for left PICA aneurysm clipping. No evidence of acute infarct. Subarachnoid hemorrhage has significantly diminished, now faintly seen best at the level of the left sylvian fissure. Small volume intraventricular hemorrhage layering in the lateral ventricles. A subdural hematoma has developed along the right cerebral convexity, both laterally and at the level of the falx, measuring up to 5 mm in thickness without notable mass effect. Right frontal external ventricular drain with tip not reaching the lateral ventricles. Ventricular volume has normalized since prior. No evidence of acute infarct. 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 Vascular: No unexpected finding Skull: Left suboccipital craniotomy and C1 posterior ring resection. There is overlying fluid collection with somewhat tense appearing margins but no thecal sac compression Sinuses/Orbits: Left enucleation and prosthesis. IMPRESSION: 1. Significant decrease in subarachnoid hemorrhage. Small volume residual is seen along the left sylvian fissure and in the  lateral ventricles. 2. Subdural hematoma along the right cerebral hemisphere measuring up to 5 mm in thickness and without notable mass effect. 3. Right frontal EVD which does not reach the lateral ventricle. Ventricular volume has normalized since comparison. 4. Fluid collection superficial to the craniotomy and C1 ring resection. Electronically Signed   By: Monte Fantasia M.D.   On: 02/12/2020 05:13   CT Head Wo Contrast  Result Date: 01/30/2020 CLINICAL DATA:  New or worsening headache, sudden onset EXAM: CT HEAD WITHOUT CONTRAST TECHNIQUE: Contiguous axial images were obtained from the base of the skull through the vertex without intravenous contrast. COMPARISON:  None. FINDINGS: Brain: Extensive subarachnoid hemorrhage greatest in the posterior fossa where there is encircling of the brainstem. Cranial extension along both sylvian fissures. There is intraventricular reflux reaching the lateral ventricles, with ventriculomegaly that is generalized. 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 No visible cortex infarct. There is brain atrophy. Vascular: Negative Skull: Negative Sinuses/Orbits: Left enucleation with prosthesis. Other: Critical Value/emergent results were called by telephone at the time of interpretation on 01/30/2020 at 5:30 am to provider [facility] , who verbally acknowledged these results. IMPRESSION: 1. Large volume of subarachnoid hemorrhage related to a ruptured left PICA aneurysm on the  contemporaneous CTA. 2. Intraventricular reflux with hydrocephalus. 3. Chronic small vessel disease. Electronically Signed   By: Monte Fantasia M.D.   On: 01/30/2020 05:32   CT SOFT TISSUE NECK W CONTRAST  Result Date: 02/20/2020 CLINICAL DATA:  Post thyroidectomy with tracheostomy possible infection EXAM: CT NECK WITH CONTRAST TECHNIQUE: Multidetector CT imaging of the neck was performed using the standard protocol following the bolus  administration of intravenous contrast. CONTRAST:  29mL OMNIPAQUE IOHEXOL 300 MG/ML  SOLN COMPARISON:  None. FINDINGS: Pharynx and larynx: Tracheostomy device present. Irregularity of the defect margins. Subglottic airway is patent. Mild laryngeal and pharyngeal wall thickening may reflect edema. Salivary glands: Unremarkable. Thyroid: Post thyroidectomy. Within the right thyroidectomy bed there is a peripherally enhancing, irregularly marginated fluid collection with foci of air likely due to communication with the tracheostomy site (series 7, image 88). Thicker hyperdense or enhancing tissue on the seen on series 7, image 85. Lymph nodes: No enlarged nodes identified. Vascular: Major neck vessels are patent. Limited intracranial: No abnormal enhancement. Left suboccipital craniotomy. Fluid collection superficial to the craniotomy extending posterior to the C1 arch, which is also been partially resected. This was present on prior head imaging. Right cerebral convexity subdural hematoma is partially imaged and likely similar to prior head CT. Visualized orbits: Left ocular prosthesis.  Otherwise unremarkable. Mastoids and visualized paranasal sinuses: No significant opacification. Skeleton: Cervical spine degenerative changes, greatest at C4-C5 and C5-C6. Upper chest: No apical lung mass or consolidation. Other: None. IMPRESSION: Post thyroidectomy with peripherally enhancing, irregularly marginated fluid collection within the surgical bed on the right back communicates with the tracheostomy site. There is irregularity of the tracheostomy defect margins. Superimposed infection is therefore possible as suspected. Electronically Signed   By: Macy Mis M.D.   On: 02/20/2020 08:16   CT Angio Neck W and/or Wo Contrast  Result Date: 01/30/2020 CLINICAL DATA:  Sudden onset headache. EXAM: CT ANGIOGRAPHY HEAD AND NECK TECHNIQUE: Multidetector CT imaging of the head and neck was performed using the standard protocol  during bolus administration of intravenous contrast. Multiplanar CT image reconstructions and MIPs were obtained to evaluate the vascular anatomy. Carotid stenosis measurements (when applicable) are obtained utilizing NASCET criteria, using the distal internal carotid diameter as the denominator. CONTRAST:  48mL OMNIPAQUE IOHEXOL 350 MG/ML SOLN COMPARISON:  Contemporaneous noncontrast head CT FINDINGS: CTA NECK FINDINGS Aortic arch: No acute finding.  Three vessel branching. Right carotid system: Vessels are smooth and widely patent. Left carotid system: Vessels are smooth and widely patent. Vertebral arteries: Left vertebral artery dominance. No proximal subclavian stenosis. The vertebral arteries are smooth and widely patent. Skeleton: Advanced cervical spine degeneration. Dental caries with molar periapical erosions. Other neck: Goiter. Upper chest: Masslike appearance left of the upper thoracic esophagus which could be intramural or extra serosal, 2.3 cm in diameter on axial slices and 2.8 cm craniocaudal on coronal reformats. Review of the MIP images confirms the above findings CTA HEAD FINDINGS Anterior circulation: Atheromatous plaque along both carotid siphons without flow reducing stenosis. No branch occlusion, generalized beading, or aneurysm seen. Posterior circulation: Left dominant vertebral artery. Projecting posteriorly towards the medulla is a left PICA aneurysm which has a mildly irregular sac shape and measures up to 3 mm base to dome. Subjective moderate narrowing of the right V4 segment just before the PICA origin. The basilar is diffusely patent. No PCA branch occlusion or aneurysm. Venous sinuses: Negative for the arterial phase. The superior ophthalmic veins show prominent size and density, suggesting increase  in intracranial pressure. Anatomic variants: As above Review of the MIP images confirms the above findings IMPRESSION: 1. Ruptured 3 mm left PICA aneurysm. 2. The left vertebral artery  is dominant and diffusely patent. Moderate right V4 segment stenosis. 3. 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 After recovery, recommend chest CT with contrast. 4. No stenosis or significant atherosclerosis in the neck. 5. 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 Electronically Signed   By: Monte Fantasia M.D.   On: 01/30/2020 05:43   US SOFT TISSUE HEAD & NECK (NON-THYROID)  Result Date: 02/05/2020 CLINICAL DATA:  64 year old female with left neck mass. EXAM: ULTRASOUND OF HEAD/NECK SOFT TISSUES TECHNIQUE: Ultrasound examination of the head and neck soft tissues was performed in the area of clinical concern. COMPARISON:  None. FINDINGS: Targeted sonographic images of the left neck in the region of the clinical concern was performed using grayscale and color Doppler. There is a 2.3 x 2.3 x 2.4 cm rounded mass in the left neck along the inferior aspect of the left thyroid lobe which appears to be arising from the thyroid gland. This mass demonstrates similar echogenicity as the background of thyroid gland. Color images demonstrate some flow within this mass. No drainable fluid collection or abscess identified. This likely represents a thyroid nodule. Dedicated thyroid ultrasound is recommended for better evaluation on a nonemergent/outpatient basis. IMPRESSION: Probable right thyroid nodule. Further evaluation with dedicated thyroid ultrasound on a nonemergent/outpatient basis recommended. Electronically Signed   By: Anner Crete M.D.   On: 02/05/2020 18:12   DG CHEST PORT 1 VIEW  Result Date: 02/15/2020 CLINICAL DATA:  NG tube placement EXAM: PORTABLE CHEST 1 VIEW COMPARISON:  02/14/2020 FINDINGS: Tracheostomy tube and feeding tube remain in place, unchanged. Is mild cardiomegaly. Increasing left basilar atelectasis or infiltrate. Right lung clear. Trace left pleural effusion. No acute bony  abnormality. IMPRESSION: Tracheostomy and feeding tube remain in place, unchanged. Increasing left lower lobe atelectasis or infiltrate. Trace left pleural effusion. Electronically Signed   By: Rolm Baptise M.D.   On: 02/15/2020 18:55   DG Chest Port 1 View  Result Date: 02/14/2020 CLINICAL DATA:  Follow-up examination for acute respiratory failure. EXAM: PORTABLE CHEST 1 VIEW COMPARISON:  Prior radiograph from 02/10/2020. FINDINGS: Tracheostomy tube in place overlying the upper airway, tip well above the carina. Dobbhoff feeding tube in place with tip overlying the stomach. Cardiomegaly, stable. Mediastinal silhouette within normal limits. Lungs well inflated. Mild streaky right basilar opacity, most consistent with atelectasis. No other focal airspace disease. No pulmonary edema or visible pleural effusion. No pneumothorax. No acute osseous finding. IMPRESSION: 1. Support apparatus in satisfactory position. 2. Mild right basilar subsegmental atelectasis. No other active cardiopulmonary disease. Electronically Signed   By: Jeannine Boga M.D.   On: 02/14/2020 02:49   DG Chest Port 1 View  Result Date: 02/10/2020 CLINICAL DATA:  Respiratory failure EXAM: PORTABLE CHEST 1 VIEW COMPARISON:  February 08, 2020 FINDINGS: There is now a tracheostomy present with catheter tip 4.6 cm above the carina. Enteric tube tip is below the diaphragm. No pneumothorax. The lungs are clear. The heart is upper normal in size with pulmonary vascularity normal. No adenopathy. No bone lesions. IMPRESSION: Tube positions as described without pneumothorax. No edema or airspace opacity. Heart upper normal in size. Electronically Signed   By: Lowella Grip III M.D.   On: 02/10/2020 09:58   DG Chest [facility]  Result Date: 02/08/2020 CLINICAL DATA:  Acute respiratory failure EXAM: PORTABLE CHEST 1 VIEW COMPARISON:  Three days ago FINDINGS: Endotracheal tube with tip just below the clavicular heads. The enteric tube  loops at the stomach. Artifact from EKG leads. There is no edema, consolidation, effusion, or pneumothorax. Prominent heart size. Stable aortic contours IMPRESSION: Stable hardware positioning and symmetric, unremarkable aeration. Electronically Signed   By: Monte Fantasia M.D.   On: 02/08/2020 07:16   DG Chest Portable 1 View  Result Date: 01/30/2020 CLINICAL DATA:  Intubation, history of subarachnoid hemorrhage EXAM: PORTABLE CHEST 1 VIEW COMPARISON:  None FINDINGS: Endotracheal tube approximately 2 cm above the carina though the carina is difficult to visualize definitively. Gastric tube in the distal stomach. Cardiomediastinal contours and hilar structures are normal. Lungs are clear. On limited assessment no acute skeletal process. IMPRESSION: 1. Endotracheal tube approximately 2 cm above the carina though the carina is difficult to definitively visualize. Tip projects over the T4 vertebral body. Gastric tube in the distal stomach. 2. No acute cardiopulmonary disease. Electronically Signed   By: Zetta Bills M.D.   On: 01/30/2020 08:02   DG Abd Portable 1V  Result Date: 02/11/2020 CLINICAL DATA:  64 year old female with abdominal distension. Ruptured intracranial aneurysm. EXAM: PORTABLE ABDOMEN - 1 VIEW COMPARISON:  02/03/2020. FINDINGS: Portable AP supine views at 1348 hours. NG tube has been removed. Enteric feeding tube now in place with tip at the level of the gastric antrum. Widespread small bowel gas, gas distended small bowel loops up to 4 cm diameter in the left abdomen. But there is also gas in the ascending colon and at both flexures. Calcified 5 cm uterine fibroid in the pelvis suspected. Negative lung bases. No pneumoperitoneum is evident on these supine views. No acute osseous abnormality identified. IMPRESSION: 1. NG type tube removed and enteric feeding tube placed with tip at the level of the gastric antrum. 2. Gas distended small bowel and proximal colon. Differential  considerations include ileus and a mechanical obstruction of the distal large bowel. 3. Calcified uterine fibroid. Electronically Signed   By: Genevie Ann M.D.   On: 02/11/2020 14:02   DG Swallowing Func-Speech Pathology  Result Date: 02/19/2020 Completed by [name], SLP Student Supervised and reviewed by Herbie Baltimore MA CCC-SLP Objective Swallowing Evaluation: Type of Study: MBS-Modified Barium Swallow Study  Patient Details Name: Nakyra Bourn MRN: [id number] Date of Birth: 03-16-1956 Today's Date: 02/19/2020 Time: SLP Start Time (ACUTE ONLY): 1250 -SLP Stop Time (ACUTE ONLY): 1302 SLP Time Calculation (min) (ACUTE ONLY): 12 min Past Medical History: No past medical history on file. Past Surgical History: Past Surgical History: Procedure Laterality Date . CRANIOTOMY Left 01/30/2020  Procedure: LEFT FAR LATERAL CRANIOTOMY FOR ANEURYSM CLIPPING;  Surgeon: [name], MD;  Location: [place];  Service: Neurosurgery;  Laterality: Left; . IR ANGIO INTRA EXTRACRAN SEL INTERNAL CAROTID BILAT MOD SED  01/30/2020 . IR ANGIO VERTEBRAL SEL VERTEBRAL UNI L MOD SED  01/30/2020 . RADIOLOGY WITH ANESTHESIA N/A 01/30/2020  Procedure: IR WITH ANESTHESIA;  Surgeon: [name], MD;  Location: [place];  Service: Radiology;  Laterality: N/A; . THYROIDECTOMY N/A 02/08/2020  Procedure: THYROIDECTOMY;  Surgeon: [name], MD;  Location: [facility];  Service: ENT;  Laterality: N/A; . 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 Admitted on 11/3, Crani  on 11/4, vented until 11/12 when pt was trached. Also found to have multinodular goiter complicating respiratory failure so total thyroidectomy also completed on 11/12. Drains removed on 11/14. ATC on 11/15. Changed to 6 cuffledd on 11/17.  There is question of left lingual  deviation in notes.  No data recorded Assessment / Plan / Recommendation CHL IP CLINICAL IMPRESSIONS 02/19/2020 Clinical Impression Pt was seen for MBS and was profoundly lethargic due to sedation. The pt has extremely abnormal pharyngeal and laryngeal anatomy with no upper airway or esophageal patency. Barium retained in upper pharynx until small portions trickled into what appears to be the vestibule and trachea. Tissue occludes all open spaces. Study d/c after one bolus given severity of dysphagia. Recommend long-term altnerative means of nutrition and further evaluation of pharyngeal and laryngeal anatomy.  SLP Visit Diagnosis Dysphagia, pharyngeal phase (R13.13) Attention and concentration deficit following -- Frontal lobe and executive function deficit following -- Impact on safety and function Severe aspiration risk;Risk for inadequate nutrition/hydration   CHL IP TREATMENT RECOMMENDATION 02/19/2020 Treatment Recommendations Therapy as outlined in treatment plan below   Prognosis 02/19/2020 Prognosis for Safe Diet Advancement Guarded Barriers to Reach Goals Severity of deficits Barriers/Prognosis Comment -- CHL IP DIET RECOMMENDATION 02/19/2020 SLP Diet Recommendations NPO;Alternative means - long-term Liquid Administration via -- Medication Administration Via alternative means Compensations -- Postural Changes --   CHL IP OTHER RECOMMENDATIONS 02/19/2020 Recommended Consults -- Oral Care Recommendations Oral care QID Other Recommendations --   CHL IP FOLLOW UP RECOMMENDATIONS 02/19/2020 Follow up Recommendations Inpatient Rehab   CHL IP FREQUENCY AND DURATION 02/19/2020 Speech Therapy Frequency (ACUTE ONLY) min 2x/week Treatment Duration 2 weeks      CHL IP ORAL PHASE 02/19/2020 Oral Phase Impaired Oral - Pudding Teaspoon -- Oral - Pudding Cup -- Oral - Honey Teaspoon -- Oral - Honey Cup -- Oral - Nectar Teaspoon -- Oral - Nectar Cup -- Oral - Nectar Straw -- Oral - Thin Teaspoon -- Oral - Thin Cup Delayed  oral transit;Holding of bolus Oral - Thin Straw -- Oral - Puree -- Oral - Mech Soft -- Oral - Regular -- Oral - Multi-Consistency -- Oral - Pill -- Oral Phase - Comment --  CHL IP PHARYNGEAL PHASE 02/19/2020 Pharyngeal Phase Impaired Pharyngeal- Pudding Teaspoon -- Pharyngeal -- Pharyngeal- Pudding Cup -- Pharyngeal -- Pharyngeal- Honey Teaspoon -- Pharyngeal -- Pharyngeal- Honey Cup -- Pharyngeal -- Pharyngeal- Nectar Teaspoon -- Pharyngeal -- Pharyngeal- Nectar Cup -- Pharyngeal -- Pharyngeal- Nectar Straw -- Pharyngeal -- Pharyngeal- Thin Teaspoon -- Pharyngeal -- Pharyngeal- Thin Cup Trace aspiration;Other (Comment) Pharyngeal -- Pharyngeal- Thin Straw -- Pharyngeal -- Pharyngeal- Puree -- Pharyngeal -- Pharyngeal- Mechanical Soft -- Pharyngeal -- Pharyngeal- Regular -- Pharyngeal -- Pharyngeal- Multi-consistency -- Pharyngeal -- Pharyngeal- Pill -- Pharyngeal -- Pharyngeal Comment --  No flowsheet data found. DeBlois, [name] 02/19/2020, 2:44 PM              ECHOCARDIOGRAM COMPLETE  Result Date: 02/01/2020    ECHOCARDIOGRAM REPORT   Patient Name:   JODI Wolpert Date of Exam: 02/01/2020 Medical Rec #:  [id number]     Height:       69.0 in Accession #:    [id number]    Weight:       160.0 lb Date of Birth:  04-21-1955     BSA:          1.879 m Patient Age:    10 years      BP:  150/61 mmHg Patient Gender: F             HR:           91 bpm. Exam Location:  Inpatient Procedure: 2D Echo Indications:    chest pain 786.50  History:        Patient has no prior history of Echocardiogram examinations.  Sonographer:    Johny Chess Referring Phys: [id number] WSFKCL CHAND  Sonographer Comments: Echo performed with patient supine and on artificial respirator. IMPRESSIONS  1. LVEF is vigorous with near midcavity obliteration during systole. . Left ventricular ejection fraction, by estimation, is 70 to 75%. The left ventricle has hyperdynamic function. The left ventricle has no regional wall motion  abnormalities. There is mild left ventricular hypertrophy. Left ventricular diastolic parameters were normal.  2. Right ventricular systolic function is normal. The right ventricular size is normal.  3. The mitral valve is abnormal. Trivial mitral valve regurgitation.  4. The aortic valve is abnormal. Aortic valve regurgitation is mild. Mild aortic valve sclerosis is present, with no evidence of aortic valve stenosis. FINDINGS  Left Ventricle: LVEF is vigorous with near midcavity obliteration during systole. Left ventricular ejection fraction, by estimation, is 70 to 75%. The left ventricle has hyperdynamic function. The left ventricle has no regional wall motion abnormalities. The left ventricular internal cavity size was small. There is mild left ventricular hypertrophy. Left ventricular diastolic parameters were normal. Right Ventricle: The right ventricular size is normal. Right vetricular wall thickness was not assessed. Right ventricular systolic function is normal. Left Atrium: Left atrial size was normal in size. Right Atrium: Right atrial size was normal in size. Pericardium: Trivial pericardial effusion is present. Mitral Valve: The mitral valve is abnormal. There is mild thickening of the mitral valve leaflet(s). Trivial mitral valve regurgitation. Tricuspid Valve: The tricuspid valve is normal in structure. Tricuspid valve regurgitation is trivial. Aortic Valve: The aortic valve is abnormal. Aortic valve regurgitation is mild. Aortic regurgitation PHT measures 288 msec. Mild aortic valve sclerosis is present, with no evidence of aortic valve stenosis. Pulmonic Valve: The pulmonic valve was normal in structure. Pulmonic valve regurgitation is not visualized. Aorta: The aortic root and ascending aorta are structurally normal, with no evidence of dilitation. IAS/Shunts: No atrial level shunt detected by color flow Doppler.  LEFT VENTRICLE PLAX 2D LVIDd:         4.50 cm  Diastology LVIDs:         2.60 cm  LV  e' medial:    8.81 cm/s LV PW:         1.40 cm  LV E/e' medial:  11.2 LV IVS:        1.10 cm  LV e' lateral:   12.70 cm/s LVOT diam:     2.20 cm  LV E/e' lateral: 7.8 LV SV:         116 LV SV Index:   62 LVOT Area:     3.80 cm  RIGHT VENTRICLE             IVC RV S prime:     16.10 cm/s  IVC diam: 1.40 cm TAPSE (M-mode): 2.8 cm LEFT ATRIUM             Index       RIGHT ATRIUM           Index LA diam:        3.60 cm 1.92 cm/m  RA Area:     13.40 cm LA Vol (  A2C):   57.7 ml 30.71 ml/m RA Volume:   32.20 ml  17.14 ml/m LA Vol (A4C):   35.3 ml 18.79 ml/m LA Biplane Vol: 46.4 ml 24.69 ml/m  AORTIC VALVE LVOT Vmax:   171.00 cm/s LVOT Vmean:  122.000 cm/s LVOT VTI:    0.306 m AI PHT:      288 msec  AORTA Ao Root diam: 2.90 cm Ao Asc diam:  3.10 cm MITRAL VALVE MV Area (PHT): 5.13 cm     SHUNTS MV Decel Time: 148 msec     Systemic VTI:  0.31 m MV E velocity: 98.50 cm/s   Systemic Diam: 2.20 cm MV A velocity: 111.00 cm/s MV E/A ratio:  0.89 Dorris Carnes MD Electronically signed by Dorris Carnes MD Signature Date/Time: 02/01/2020/3:00:21 PM    Final    US THYROID  Result Date: 02/06/2020 CLINICAL DATA:  Left thyroid mass EXAM: THYROID ULTRASOUND TECHNIQUE: Ultrasound examination of the thyroid gland and adjacent soft tissues was performed. COMPARISON:  None. FINDINGS: Parenchymal Echotexture: Mildly heterogenous Isthmus: 1.3 cm Right lobe: 6.9 x 3.3 x 3 cm Left lobe: 7.6 x 3.2 x 4.2 cm _________________________________________________________ Estimated total number of nodules >/= 1 cm: 2 Number of spongiform nodules >/=  2 cm not described below (TR1): 0 Number of mixed cystic and solid nodules >/= 1.5 cm not described below (TR2): 0 _________________________________________________________ Small cystic nodules are noted in the right thyroid gland measuring up to approximately 4 mm. There is an isoechoic ill-defined 1 cm thyroid nodule in the isthmus that does not meet criteria for follow-up or fine-needle aspiration.  Nodule # 1: Location: Left; Inferior Maximum size: 3.4 cm; Other 2 dimensions: 2.5 x 2.4 cm Composition: solid/almost completely solid (2) Echogenicity: isoechoic (1) Shape: not taller-than-wide (0) Margins: ill-defined (0) Echogenic foci: none (0) ACR TI-RADS total points: 3. ACR TI-RADS risk category: TR3 (3 points). ACR TI-RADS recommendations: **Given size (>/= 2.5 cm) and appearance, fine needle aspiration of this mildly suspicious nodule should be considered based on TI-RADS criteria. _________________________________________________________ There is an additional 9 mm cystic nodule in the left inferior thyroid gland that does not meet criteria for follow-up. IMPRESSION: 1. Multinodular goiter as detailed above. 2. There is a 3.4 cm TR 3 thyroid nodule in the left inferior thyroid gland. Fine-needle aspiration is recommended for this thyroid nodule. The above is in keeping with the ACR TI-RADS recommendations - J Am Coll Radiol 2017;14:587-595. Electronically Signed   By: Constance Holster M.D.   On: 02/06/2020 19:01   VAS [place] TRANSCRANIAL DOPPLER  Result Date: 02/11/2020  Transcranial Doppler Indications: Subarachnoid hemorrhage. Limitations: Patient movement Limitations for diagnostic windows: Unable to insonate right transtemporal window. Unable to insonate left transtemporal window. Comparison Study: 01/31/2020, 02/01/2020, 02/04/2020, 02/06/2020 Performing Technologist: Oliver Hum RVT  Examination Guidelines: A complete evaluation includes B-mode imaging, spectral Doppler, color Doppler, and power Doppler as needed of all accessible portions of each vessel. Bilateral testing is considered an integral part of a complete examination. Limited examinations for reoccurring indications may be performed as noted.  +----------+-------------+----------+-----------+------------------+ RIGHT TCD Right VM (cm)Depth (cm)Pulsatility     Comment        +----------+-------------+----------+-----------+------------------+ MCA                                         Unable to insonate +----------+-------------+----------+-----------+------------------+ ACA  Unable to insonate +----------+-------------+----------+-----------+------------------+ Term ICA                                    Unable to insonate +----------+-------------+----------+-----------+------------------+ PCA                                         Unable to insonate +----------+-------------+----------+-----------+------------------+ Opthalmic     21.00                 1.39                       +----------+-------------+----------+-----------+------------------+ ICA siphon    16.00                 1.41                       +----------+-------------+----------+-----------+------------------+ Vertebral                                   Unable to insonate +----------+-------------+----------+-----------+------------------+  +----------+------------+----------+-----------+------------------+ LEFT TCD  Left VM (cm)Depth (cm)Pulsatility     Comment       +----------+------------+----------+-----------+------------------+ MCA                                        Unable to insonate +----------+------------+----------+-----------+------------------+ ACA                                        Unable to insonate +----------+------------+----------+-----------+------------------+ Term ICA                                   Unable to insonate +----------+------------+----------+-----------+------------------+ PCA                                        Unable to insonate +----------+------------+----------+-----------+------------------+ Opthalmic    20.00                 1.66                       +----------+------------+----------+-----------+------------------+ ICA siphon   13.00                  1.44                       +----------+------------+----------+-----------+------------------+ Vertebral                                  Unable to insonate +----------+------------+----------+-----------+------------------+  +------------+-------+------------------+             VM cm/s     Comment       +------------+-------+------------------+ Prox Basilar       Unable to insonate +------------+-------+------------------+ Dist Basilar       Unable to insonate +------------+-------+------------------+ Summary:  *See table(s) above  for TCD measurements and observations.  Diagnosing physician: Servando Snare MD Electronically signed by Servando Snare MD on 02/11/2020 at 4:35:28 PM.    Final    VAS [place] TRANSCRANIAL DOPPLER  Result Date: 02/07/2020  Transcranial Doppler Indications: Subarachnoid hemorrhage. History: No known past medical history. Limitations for diagnostic windows: Unable to insonate right transtemporal window. Unable to insonate left transtemporal window. Unable to insonate left orbital window. Unable to insonate occipital window. Comparison Study: 02/04/2020- TCD Performing Technologist: Maudry Mayhew MHA, RDMS, RVT, RDCS  Examination Guidelines: A complete evaluation includes B-mode imaging, spectral Doppler, color Doppler, and power Doppler as needed of all accessible portions of each vessel. Bilateral testing is considered an integral part of a complete examination. Limited examinations for reoccurring indications may be performed as noted.  +----------+-------------+----------+-----------+------------------+ RIGHT TCD Right VM (cm)Depth (cm)Pulsatility     Comment       +----------+-------------+----------+-----------+------------------+ MCA                                         Unable to insonate +----------+-------------+----------+-----------+------------------+ ACA                                         Unable to insonate  +----------+-------------+----------+-----------+------------------+ Term ICA                                    Unable to insonate +----------+-------------+----------+-----------+------------------+ PCA                                         Unable to insonate +----------+-------------+----------+-----------+------------------+ Opthalmic     17.00                 1.40                       +----------+-------------+----------+-----------+------------------+ ICA siphon    20.00                 1.19                       +----------+-------------+----------+-----------+------------------+ Vertebral                                   Unable to insonate +----------+-------------+----------+-----------+------------------+  +----------+------------+----------+-----------+------------------+ LEFT TCD  Left VM (cm)Depth (cm)Pulsatility     Comment       +----------+------------+----------+-----------+------------------+ MCA                                        Unable to insonate +----------+------------+----------+-----------+------------------+ ACA                                        Unable to insonate +----------+------------+----------+-----------+------------------+ Term ICA  Unable to insonate +----------+------------+----------+-----------+------------------+ PCA                                        Unable to insonate +----------+------------+----------+-----------+------------------+ Opthalmic                                  Unable to insonate +----------+------------+----------+-----------+------------------+ ICA siphon                                 Unable to insonate +----------+------------+----------+-----------+------------------+ Vertebral                                  Unable to insonate +----------+------------+----------+-----------+------------------+   +------------+-------+------------------+             VM cm/s     Comment       +------------+-------+------------------+ Prox Basilar       Unable to insonate +------------+-------+------------------+ Summary:  Absent bitemporal and occipital windows greatly limit exam. antegrade flow in right opthalmic and carotid siphon noted. *See table(s) above for TCD measurements and observations.  Diagnosing physician: Antony Contras MD Electronically signed by Antony Contras MD on 02/07/2020 at 12:58:16 PM.    Final    VAS [place] TRANSCRANIAL DOPPLER  Result Date: 02/04/2020  Transcranial Doppler Indications: Subarachnoid hemorrhage. Limitations for diagnostic windows: Unable to insonate right transtemporal window. Unable to insonate left transtemporal window. Comparison Study: No prior studies. Performing Technologist: Darlin Coco, RDMS Supporting Technologist: Oda Cogan RDMS, RVT  Examination Guidelines: A complete evaluation includes B-mode imaging, spectral Doppler, color Doppler, and power Doppler as needed of all accessible portions of each vessel. Bilateral testing is considered an integral part of a complete examination. Limited examinations for reoccurring indications may be performed as noted.  +----------+-------------+----------+-----------+-------------------+ RIGHT TCD Right VM (cm)Depth (cm)Pulsatility      Comment       +----------+-------------+----------+-----------+-------------------+ MCA                                         unable to insonated +----------+-------------+----------+-----------+-------------------+ ACA                                         unable to insonated +----------+-------------+----------+-----------+-------------------+ Term ICA                                    unable to insonated +----------+-------------+----------+-----------+-------------------+ PCA                                         unable to insonated  +----------+-------------+----------+-----------+-------------------+ Opthalmic     20.00       4.00      1.29                        +----------+-------------+----------+-----------+-------------------+ ICA siphon    86.00       6.60  1.15                        +----------+-------------+----------+-----------+-------------------+ Vertebral                                   unable to insonated +----------+-------------+----------+-----------+-------------------+  +----------+------------+----------+-----------+-------------------+ LEFT TCD  Left VM (cm)Depth (cm)Pulsatility      Comment       +----------+------------+----------+-----------+-------------------+ MCA                                        unable to insonated +----------+------------+----------+-----------+-------------------+ ACA                                        unable to insonated +----------+------------+----------+-----------+-------------------+ Term ICA                                   unable to insonated +----------+------------+----------+-----------+-------------------+ PCA                                        unable to insonated +----------+------------+----------+-----------+-------------------+ Opthalmic    16.00       3.60      1.14                        +----------+------------+----------+-----------+-------------------+ ICA siphon   23.00       5.90      0.54                        +----------+------------+----------+-----------+-------------------+ Vertebral    21.00       7.00      1.18                        +----------+------------+----------+-----------+-------------------+  +------------+-------+-------------------+             VM cm/s      Comment       +------------+-------+-------------------+ Prox Basilar       unable to insonated +------------+-------+-------------------+ Dist Basilar       unable to insonated  +------------+-------+-------------------+ Summary:  Absent bitemporal and occpital windows limits evaluation. Antegrade flow in both carotid siphons and opthalmic arteries. No deficite evidence of vasospasm noted. *See table(s) above for TCD measurements and observations.  Diagnosing physician: Antony Contras MD Electronically signed by Antony Contras MD on 02/04/2020 at 2:00:21 PM.    Final    VAS [place] TRANSCRANIAL DOPPLER  Result Date: 02/04/2020  Transcranial Doppler Indications: Subarachnoid hemorrhage. History: SAH#2, status post clipping left PICA aneurysm. Limitations for diagnostic windows: Unable to insonate right transtemporal window. Unable to insonate left transtemporal window. Unable to insonate occipital window. Performing Technologist: Oda Cogan RDMS, RVT  Examination Guidelines: A complete evaluation includes B-mode imaging, spectral Doppler, color Doppler, and power Doppler as needed of all accessible portions of each vessel. Bilateral testing is considered an integral part of a complete examination. Limited examinations for reoccurring indications may be performed as noted.  +----------+-------------+----------+-----------+-------------------+ RIGHT TCD Right VM (cm)Depth (cm)Pulsatility  Comment       +----------+-------------+----------+-----------+-------------------+ MCA                                         unable to insonated +----------+-------------+----------+-----------+-------------------+ ACA                                         unable to insonated +----------+-------------+----------+-----------+-------------------+ Term ICA                                    unable to insonated +----------+-------------+----------+-----------+-------------------+ PCA                                         unable to insonated +----------+-------------+----------+-----------+-------------------+ Opthalmic     23.00                                              +----------+-------------+----------+-----------+-------------------+ ICA siphon    99.00                                             +----------+-------------+----------+-----------+-------------------+ Vertebral                                   unable to insonated +----------+-------------+----------+-----------+-------------------+  +----------+------------+----------+-----------+-------------------+ LEFT TCD  Left VM (cm)Depth (cm)Pulsatility      Comment       +----------+------------+----------+-----------+-------------------+ MCA                                        unable to insonated +----------+------------+----------+-----------+-------------------+ ACA                                        unable to insonated +----------+------------+----------+-----------+-------------------+ Term ICA                                   unable to insonated +----------+------------+----------+-----------+-------------------+ PCA                                        unable to insonated +----------+------------+----------+-----------+-------------------+ Opthalmic    19.00                                             +----------+------------+----------+-----------+-------------------+ ICA siphon   28.00                                             +----------+------------+----------+-----------+-------------------+  Vertebral                                  unable to insonated +----------+------------+----------+-----------+-------------------+  +------------+-------+-------------------+             VM cm/s      Comment       +------------+-------+-------------------+ Prox Basilar       unable to insonated +------------+-------+-------------------+ Dist Basilar       unable to insonated +------------+-------+-------------------+ Summary:  Absent bitemporal and occpital windows limits evaluation. Antegrade flow in both carotid siphons and  opthalmic arteries. No deficite evidence of vasospasm noted. *See table(s) above for TCD measurements and observations.  Diagnosing physician: Antony Contras MD Electronically signed by Antony Contras MD on 02/04/2020 at 1:59:57 PM.    Final    VAS [place] TRANSCRANIAL DOPPLER  Result Date: 02/04/2020  Transcranial Doppler Indications: Subarachnoid hemorrhage. Limitations: Patient positioning, patient immobility, bandages Limitations for diagnostic windows: Unable to insonate right transtemporal window. Unable to insonate left transtemporal window. Comparison Study: 01/31/2020, 02/01/2020 Performing Technologist: Oliver Hum RVT  Examination Guidelines: A complete evaluation includes B-mode imaging, spectral Doppler, color Doppler, and power Doppler as needed of all accessible portions of each vessel. Bilateral testing is considered an integral part of a complete examination. Limited examinations for reoccurring indications may be performed as noted.  +----------+-------------+----------+-----------+------------------+ RIGHT TCD Right VM (cm)Depth (cm)Pulsatility     Comment       +----------+-------------+----------+-----------+------------------+ MCA                                         Unable to insonate +----------+-------------+----------+-----------+------------------+ ACA                                         Unable to insonate +----------+-------------+----------+-----------+------------------+ Term ICA                                    Unable to insonate +----------+-------------+----------+-----------+------------------+ PCA                                         Unable to insonate +----------+-------------+----------+-----------+------------------+ Opthalmic     12.00                 1.18                       +----------+-------------+----------+-----------+------------------+ ICA siphon    31.00                 0.77                        +----------+-------------+----------+-----------+------------------+ Vertebral                                   Unable to insonate +----------+-------------+----------+-----------+------------------+  +----------+------------+----------+-----------+------------------+ LEFT TCD  Left VM (cm)Depth (cm)Pulsatility     Comment       +----------+------------+----------+-----------+------------------+ MCA  Unable to insonate +----------+------------+----------+-----------+------------------+ ACA                                        Unable to insonate +----------+------------+----------+-----------+------------------+ Term ICA                                   Unable to insonate +----------+------------+----------+-----------+------------------+ PCA                                        Unable to insonate +----------+------------+----------+-----------+------------------+ Opthalmic    10.00                   3                        +----------+------------+----------+-----------+------------------+ ICA siphon                                 Unable to insonate +----------+------------+----------+-----------+------------------+ Vertebral                                  Unable to insonate +----------+------------+----------+-----------+------------------+  +------------+-------+------------------+             VM cm/s     Comment       +------------+-------+------------------+ Prox Basilar       Unable to insonate +------------+-------+------------------+ Dist Basilar       Unable to insonate +------------+-------+------------------+ Summary:  Absent bitemporal and occipital windows greatly limits exam. Antegrade flow in both opthalmics and right carotid siphon. *See table(s) above for TCD measurements and observations.  Diagnosing physician: Antony Contras MD Electronically signed by Antony Contras MD on 02/04/2020 at  1:54:46 PM.    Final    IR ANGIO INTRA EXTRACRAN SEL INTERNAL CAROTID BILAT MOD SED  Result Date: 01/30/2020 PROCEDURE: DIAGNOSTIC CEREBRAL ANGIOGRAM HISTORY: The patient is a 64 year old woman presenting to the hospital today with sudden onset of severe headache. She had progressive decline in mental status in the emergency department requiring intubation for airway protection and placement of an external ventricular drain. Her CT scan demonstrated diffuse primarily posterior fossa subarachnoid hemorrhage including intraventricular extension and associated hydrocephalus. CT angiogram suggested the possibility of a left PICA aneurysm. She presents for further workup with diagnostic cerebral angiogram. ACCESS: The technical aspects of the procedure as well as its potential risks and benefits were reviewed with the patient. These risks included but were not limited bleeding, infection, allergic reaction, damage to organs or vital structures, stroke, non-diagnostic procedure, and the catastrophic outcomes of heart attack, coma, and death. With an understanding of these risks, informed consent was obtained and witnessed. The patient was placed in the supine position on the angiography table and the skin of right groin prepped in the usual sterile fashion. The procedure was performed under general anesthesia administered and monitored by the anesthesia service. A 5- French sheath was introduced in the right common femoral artery using Seldinger technique. A fluoro-phase sequence was used to document the sheath position. MEDICATIONS: HEPARIN: 0 Units total. CONTRAST:  cc, Omnipaque 300 FLUOROSCOPY TIME:  FLUOROSCOPY TIME: See IR records TECHNIQUE: CATHETERS AND  WIRES 5-French JB-1 catheter Five French Simmons 2 glide catheter 0.035" glidewire VESSELS CATHETERIZED Right internal carotid Left common carotid Left vertebral Right common femoral VESSELS STUDIED Right internal carotid, head Left common carotid, head Left  vertebral Left vertebral, three-dimensional rotational angiogram Right femoral PROCEDURAL NARRATIVE A 5-Fr JB-1 catheter was advanced over a 0.035 glidewire into the aortic arch. The above vessels were then sequentially catheterized and cervical / cerebral angiograms taken. After review of images, the catheter was removed without incident. The Simmons 2 glide catheter was then introduced and the secondary curve was reformed over the aortic arch. The left common carotid artery was then catheterized and cerebral angiogram taken. The catheter was removed then without incident FINDINGS: Right internal carotid, head: Injection reveals the presence of a widely patent ICA, M1, and A1 segments and their branches. There is a small wide necked aneurysm at the right middle cerebral artery bifurcation measuring approximately 1.9 mm at its base and approximately 1.2 mm tall. Aneurysm projects laterally. The parenchymal and venous phases are normal. The venous sinuses are widely patent. Left common carotid, head: Injection reveals the presence of a widely patent ICA, A1, and M1 segments and their branches. No aneurysms, AVMs, or high-flow fistulas are seen. The visualized cranial branches of the external carotid artery are unremarkable. The parenchymal and venous phases are normal. The venous sinuses are widely patent. Left vertebral: Injection reveals the presence of a widely patent vertebral artery. This leads to a widely patent basilar artery that terminates in bilateral P1. The basilar apex is normal. There is a double density corresponding to the aneurysm seen on the previous CT angiogram at the origin of the left PICA. It is not well visualized due to the proximity of the vertebral artery, making adequate views of the aneurysm difficult. The aneurysm appears relatively wide based measuring approximately 1.7 mm at that neck and approximately 1.6 mm tall. The parenchymal and venous phases are normal. The venous sinuses are  widely patent. Left vertebral  3D rotation 3-dimensional rotational angiographic images were reconstructed on an independent workstation for review. These further delineate the aneurysm of the proximal left PICA. Aneurysm is noted to be wide based and less than 2 mm in height and approximately 2 mm at its neck. The aneurysm appears to project medially. Right femoral: Normal vessel. No significant atherosclerotic disease. Arterial sheath in adequate position. DISPOSITION: Upon completion of the study, the femoral sheath was removed and hemostasis obtained using a 5-Fr ExoSeal closure device. Good proximal and distal lower extremity pulses were documented upon achievement of hemostasis. The procedure was well tolerated and no early complications were observed. The patient was transferred to the operating room for surgical treatment of her aneurysm. IMPRESSION: 1. 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 This is not felt to be amenable to coil embolization because of the wide necked morphology, nor is it felt to be amenable for stent supported coil embolization due to the location and size of the aneurysm. Also not felt to be amenable to flow diversion because of its location. Surgical clipping is therefore recommended. 2. 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 The preliminary results of this procedure were shared with the patient and the patient's family. Electronically Signed   By: [name]   On: 01/30/2020 19:58   IR ANGIO VERTEBRAL SEL VERTEBRAL UNI L MOD SED  Result Date: 02/08/2020 PROCEDURE: DIAGNOSTIC CEREBRAL ANGIOGRAM  HISTORY: The  patient is a 64 year old woman presenting to the hospital today with sudden onset of severe headache. She had progressive decline in mental status in the emergency department requiring intubation for airway protection and placement of an external ventricular drain.  Her CT scan demonstrated diffuse primarily posterior fossa subarachnoid hemorrhage including intraventricular extension and associated hydrocephalus. CT angiogram suggested the possibility of a left PICA aneurysm. She presents for further workup with diagnostic cerebral angiogram.  ACCESS: The technical aspects of the procedure as well as its potential risks and benefits were reviewed with the patient. These risks included but were not limited bleeding, infection, allergic reaction, damage to organs or vital structures, stroke, non-diagnostic procedure, and the catastrophic outcomes of heart attack, coma, and death. With an understanding of these risks, informed consent was obtained and witnessed. The patient was placed in the supine position on the angiography table and the skin of right groin prepped in the usual sterile fashion.  The procedure was performed under general anesthesia administered and monitored by the anesthesia service.  A 5- French sheath was introduced in the right common femoral artery using Seldinger technique. A fluoro-phase sequence was used to document the sheath position.  MEDICATIONS: HEPARIN: 0 Units total.  CONTRAST:  cc, Omnipaque 300  FLUOROSCOPY TIME:  FLUOROSCOPY TIME: See IR records  TECHNIQUE: CATHETERS AND WIRES 5-French JB-1 catheter  Five French Simmons 2 glide catheter  0.035" glidewire  VESSELS CATHETERIZED Right internal carotid  Left common carotid  Left vertebral  Right common femoral  VESSELS STUDIED Right internal carotid, head  Left common carotid, head  Left vertebral  Left vertebral, three-dimensional rotational angiogram  Right femoral  PROCEDURAL NARRATIVE A 5-Fr JB-1 catheter was advanced over a 0.035 glidewire into the aortic arch. The above vessels were then sequentially catheterized and cervical / cerebral angiograms taken. After review of images, the catheter was removed without incident. The Simmons 2 glide catheter was then introduced and  the secondary curve was reformed over the aortic arch. The left common carotid artery was then catheterized and cerebral angiogram taken. The catheter was removed then without incident  FINDINGS: Right internal carotid, head:  Injection reveals the presence of a widely patent ICA, M1, and A1 segments and their branches. There is a small wide necked aneurysm at the right middle cerebral artery bifurcation measuring approximately 1.9 mm at its base and approximately 1.2 mm tall. Aneurysm projects laterally. The parenchymal and venous phases are normal. The venous sinuses are widely patent.  Left common carotid, head:  Injection reveals the presence of a widely patent ICA, A1, and M1 segments and their branches. No aneurysms, AVMs, or high-flow fistulas are seen. The visualized cranial branches of the external carotid artery are unremarkable. The parenchymal and venous phases are normal. The venous sinuses are widely patent.  Left vertebral:  Injection reveals the presence of a widely patent vertebral artery. This leads to a widely patent basilar artery that terminates in bilateral P1. The basilar apex is normal. There is a double density corresponding to the aneurysm seen on the previous CT angiogram at the origin of the left PICA. It is not well visualized due to the proximity of the vertebral artery, making adequate views of the aneurysm difficult. The aneurysm appears relatively wide based measuring approximately 1.7 mm at that neck and approximately 1.6 mm tall. The parenchymal and venous phases are normal. The venous sinuses are widely patent.  Left vertebral  3D rotation  3-dimensional rotational angiographic images were reconstructed on an  independent workstation for review. These further delineate the aneurysm of the proximal left PICA. Aneurysm is noted to be wide based and less than 2 mm in height and approximately 2 mm at its neck. The aneurysm appears to project medially.  Right femoral:  Normal  vessel. No significant atherosclerotic disease. Arterial sheath in adequate position.  DISPOSITION: Upon completion of the study, the femoral sheath was removed and hemostasis obtained using a 5-Fr ExoSeal closure device. Good proximal and distal lower extremity pulses were documented upon achievement of hemostasis. The procedure was well tolerated and no early complications were observed. The patient was transferred to the operating room for surgical treatment of her aneurysm.  IMPRESSION: 1. 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 This is not felt to be amenable to coil embolization because of the wide necked morphology, nor is it felt to be amenable for stent supported coil embolization due to the location and size of the aneurysm. Also not felt to be amenable to flow diversion because of its location. Surgical clipping is therefore recommended.  2. 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  The preliminary results of this procedure were shared with the patient and the patient's family.   Electronically Signed   By: [name]   On: 01/30/2020 19:58   Labs:  CBC: Recent Labs    02/10/20 0225 02/15/20 0506 02/18/20 1831 02/20/20 0334  WBC 14.8* 12.5* 7.4 7.4  HGB 8.3* 10.2* 10.0* 10.4*  HCT 27.3* 31.9* 31.6* 33.6*  PLT 151 PLATELET CLUMPS NOTED ON SMEAR, UNABLE TO ESTIMATE 289 275    COAGS: Recent Labs    01/30/20 0859  INR 1.0  APTT 27    BMP: Recent Labs    02/11/20 0434 02/13/20 1204 02/15/20 0506 02/20/20 0334  NA 153* 147* 135 140  K 3.5 3.1* 3.0* 3.3*  CL 120* 112* 101 104  CO2 23 23 23 22   GLUCOSE 200* 132* 100* 99  BUN 20 16 18 9   CALCIUM 8.7* 8.9 8.3* 8.6*  CREATININE 0.77 0.61 0.62 0.66  GFRNONAA >60 >60 >60 >60    LIVER FUNCTION TESTS: Recent Labs    02/02/20 0424 02/08/20 0306 02/09/20 0601 02/20/20 0334  BILITOT  --  0.5 0.4 1.5*  AST  --   20 20 26   ALT  --  30 26 39  ALKPHOS  --  78 78 76  PROT  --  6.5 6.4* 6.1*  ALBUMIN 2.7* 2.5* 2.4* 2.6*    TUMOR MARKERS: No results for input(s): AFPTM, CEA, CA199, CHROMGRNA in the last 8760 hours.  Assessment and Plan:  64 y/o F admitted 11/3 with ruptured left PICA aneurysm s/p clipping/craniotomy. She required long term intubation and subsequent tracheostomy placement/total thyroidectomy on 11/12. She has recently failed swallow evaluation and IR has been asked to place a percutaneous gastrostomy tube for ongoing nutritional needs.  Patient history and imaging have been reviewed by Dr. Pascal Lux who states patient's anatomy is amenable to percutaneous gastrostomy placement in IR. Will tentatively plan for g-tube placement Friday 11/26 pending any emergent procedures. Patient to be NPO/hold tube feedings at midnight on 11/26, heparin SQ to be held after 2200 dose on 11/25, labs pending AM of 11/26.  I have attempted to call patient's daughter [name] for consent but have been unable to reach her - will continue to reach out to family for consent.  Please call IR with questions or  concerns.  Thank you for this interesting consult.  I greatly enjoyed meeting Stacy Moran and look forward to participating in their care.  A copy of this report was sent to the requesting provider on this date.  Electronically Signed: Joaquim Nam, PA-C 02/20/2020, 2:48 PM   I spent a total of 40 Minutes  in face to face in clinical consultation, greater than 50% of which was counseling/coordinating care for g-tube placement.

## 2020-02-20 NOTE — Progress Notes (Signed)
PROGRESS NOTE    Stacy Moran  UGQ:[id number] DOB: 1956-02-18 DOA: 01/30/2020 PCP: No primary care provider on file.   Brief Narrative:  64 y.o. 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   She had a tracheostomy and total thyroidectomy on 11/12; she has self-decanulated twice and is now in soft wrist restraints with a bedside sitter.   She has been recommended for G-tube placement.  She is awake and appears to be able to answer yes/no questions by head movements but is unable to talk.  She denies pain.  Appears to have cognitive function.Had aneurysm with clipping, prolonged intubation with enlarged thyroid.  She had thyroidectomy in order for trach to be placed.  She has been off the vent for a week and stable.  Needs routine trach care - Dr. Constance Holster is following.  Likely needs PEG - IR consult request placed.  Has sputum culture pending; on MBS she appears to have subglottal obstruction and so CT ordered and started on Cefepime.  Has developed HTN, DM while hospitalized.   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  Supportive care with concerns of infection therefore on cefepime.  Essential hypertension, uncontrolled -Currently on Cozaar, labetalol, amlodipine.  Hydralazine added. -Suspect some autonomic dysfunction  Prediabetes -No known medical problems PTA -A1c on 11/3 was 6.2 -She is currently on Levemir and resistant scale  SSI  Dysphagia -Failed swallow evaluation -Plans for PEG tube placement today  Thyroidectomy -Status post total thyroidectomy.  Follow-up with ENT -Daily Synthroid      Body mass index is 21.19 kg/m.  Pressure Injury 02/12/20 Throat Mid Stage 2 -  Partial thickness loss of dermis presenting as a shallow open injury with a red, pink wound bed without slough. (Active)  02/12/20 1500  Location: Throat  Location Orientation: Mid  Staging: Stage 2 -  Partial thickness loss of dermis presenting as a shallow open injury with a red, pink wound bed without slough.  Wound Description (Comments):   Present on Admission: No      Subjective: Working with physical therapy during my visit, she appeared very unsteady.  Will slowly appears to be making progress.  Still tachycardic and hypertensive Intermittently pulling off her tracheostomy therefore mittens in place   Examination:  General exam: Appears chronically ill Respiratory system: Clear to auscultation. Respiratory effort normal. Cardiovascular system: Sinus tachycardia Gastrointestinal system: Abdomen is nondistended, soft and nontender. No organomegaly or masses felt. Normal bowel sounds heard. Central nervous system: Alert to name and place Extremities: Symmetric 5 x 5 power. Skin: No rashes, lesions or ulcers Psychiatry: Poor judgment and insight  Objective: Vitals:   02/20/20 0733 02/20/20 0821 02/20/20 0829 02/20/20 0835  BP: (!) 163/94   (!) 179/86  Pulse: 99 (!) 113 90 99  Resp: 17 20  17   Temp: 98.9 F (37.2 C)   98.9 F (37.2 C)  TempSrc: Axillary   Axillary  SpO2: 100% 100%  100%  Weight:      Height:  Intake/Output Summary (Last 24 hours) at 02/20/2020 1124 Last data filed at 02/20/2020 0851 Gross per 24 hour  Intake 2274.35 ml  Output 3045 ml  Net -770.65 ml   Filed Weights   02/18/20 0349 02/19/20 0500 02/20/20 0425  Weight: 68.3 kg 66.8 kg 65.1 kg     Data Reviewed:    CBC: Recent Labs  Lab 02/15/20 0506 02/18/20 1831 02/20/20 0334  WBC 12.5* 7.4 7.4  NEUTROABS  --   --  5.9  HGB 10.2* 10.0* 10.4*  HCT 31.9* 31.6* 33.6*  MCV 82.9 82.5 83.8  PLT PLATELET CLUMPS NOTED ON SMEAR, UNABLE TO ESTIMATE 289 086   Basic Metabolic Panel: Recent Labs  Lab 02/13/20 1204 02/15/20 0506 02/20/20 0334  NA 147* 135 140  K 3.1* 3.0* 3.3*  CL 112* 101 104  CO2 23 23 22   GLUCOSE 132* 100* 99  BUN 16 18 9   CREATININE 0.61 0.62 0.66  CALCIUM 8.9 8.3* 8.6*  MG  --  2.3  --    GFR: Estimated Creatinine Clearance: 74.9 mL/min (by C-G formula based on SCr of 0.66 mg/dL). Liver Function Tests: Recent Labs  Lab 02/20/20 0334  AST 26  ALT 39  ALKPHOS 76  BILITOT 1.5*  PROT 6.1*  ALBUMIN 2.6*   No results for input(s): LIPASE, AMYLASE in the last 168 hours. No results for input(s): AMMONIA in the last 168 hours. Coagulation Profile: No results for input(s): INR, PROTIME in the last 168 hours. Cardiac Enzymes: No results for input(s): CKTOTAL, CKMB, CKMBINDEX, TROPONINI in the last 168 hours. BNP (last 3 results) No results for input(s): PROBNP in the last 8760 hours. HbA1C: No results for input(s): HGBA1C in the last 72 hours. CBG: Recent Labs  Lab 02/19/20 1612 02/19/20 1918 02/19/20 2307 02/20/20 0303 02/20/20 0858  GLUCAP 78 89 92 81 86   Lipid Profile: No results for input(s): CHOL, HDL, LDLCALC, TRIG, CHOLHDL, LDLDIRECT in the last 72 hours. Thyroid Function Tests: No results for input(s): TSH, T4TOTAL, FREET4, T3FREE, THYROIDAB in the last 72 hours. Anemia Panel: No results for input(s): VITAMINB12, FOLATE, FERRITIN, TIBC, IRON, RETICCTPCT in the last 72 hours. Sepsis Labs: No results for input(s): PROCALCITON, LATICACIDVEN in the last 168 hours.  Recent Results (from the past 240 hour(s))  Culture, respiratory (non-expectorated)     Status: None   Collection Time: 02/16/20  4:02 PM   Specimen: Tracheal Aspirate; Respiratory   Result Value Ref Range Status   Specimen Description TRACHEAL ASPIRATE  Final   Special Requests NONE  Final   Gram Stain   Final    ABUNDANT WBC PRESENT, PREDOMINANTLY PMN ABUNDANT GRAM NEGATIVE RODS MODERATE GRAM POSITIVE COCCI IN PAIRS FEW GRAM POSITIVE RODS    Culture   Final    Consistent with normal respiratory flora. No Pseudomonas species isolated Performed at [facility] [address] [place], [place] [address]    Report Status 02/18/2020 FINAL  Final  Aerobic Culture (superficial specimen)     Status: None   Collection Time: 02/18/20  9:43 PM   Specimen: Wound  Result Value Ref Range Status   Specimen Description WOUND NECK  Final   Special Requests NONE  Final   Gram Stain   Final    MODERATE WBC PRESENT,BOTH PMN AND MONONUCLEAR ABUNDANT GRAM POSITIVE COCCI ABUNDANT GRAM NEGATIVE RODS    Culture   Final    FEW MULTIPLE ORGANISMS PRESENT, NONE PREDOMINANT NO STAPHYLOCOCCUS AUREUS ISOLATED NO GROUP A STREP (S.PYOGENES)  ISOLATED Performed at [facility], [place] [address], [place], [place] [address]    Report Status 02/20/2020 FINAL  Final         Radiology Studies: CT ABDOMEN WO CONTRAST  Result Date: 02/20/2020 CLINICAL DATA:  Evaluate anatomy prior to potential percutaneous gastrostomy tube placement. EXAM: CT ABDOMEN WITHOUT CONTRAST TECHNIQUE: Multidetector CT imaging of the abdomen was performed following the standard protocol without IV contrast. COMPARISON:  None. FINDINGS: The lack of intravenous contrast limits the ability to evaluate solid abdominal organs. Lower chest: Limited visualization of lower thorax demonstrates minimal dependent subpleural ground-glass atelectasis. No pleural effusion. Cardiomegaly. Trace amount of pericardial fluid, potentially physiologic. Hepatobiliary: Normal hepatic contour. Normal noncontrast appearance of the gallbladder given degree distention, though note, evaluation further degraded secondary to  significant streak artifact from adjacent monitoring apparatus. No ascites. Pancreas: The pancreas appears atrophic. Spleen: Normal noncontrast appearance of the spleen. Adrenals/Urinary Tract: Normal noncontrast appearance of the bilateral kidneys. No renal stones. No urine obstruction or perinephric stranding. Mild thickening of the crux of left adrenal gland without discrete nodule. Normal noncontrast appearance of the right adrenal gland. The urinary bladder was not imaged. Stomach/Bowel: The anterior wall of the mid/distal aspect of the stomach is well apposed against the ventral wall of the abdomen without interposition of either the hepatic parenchyma or the transverse colon and will likely be improved with gastric insufflation (representative image 24, series 3). Moderate liquid stool burden. No evidence of enteric obstruction. Ingested radiopaque debris is seen at the level of the hepatic flexure of the colon. Normal noncontrast appearance of the appendix. No pneumoperitoneum, pneumatosis or portal venous gas. Vascular/Lymphatic: Normal caliber of the abdominal aorta. No bulky retroperitoneal or mesenteric lymphadenopathy on this noncontrast examination. Other: Diffuse body wall anasarca, most conspicuous about the midline of the low back. Musculoskeletal: No acute or aggressive osseous abnormalities. IMPRESSION: 1. Gastric anatomy amenable to potential percutaneous gastrostomy tube placement as indicated. 2. Cardiomegaly. Electronically Signed   By: Sandi Mariscal M.D.   On: 02/20/2020 09:52   CT SOFT TISSUE NECK W CONTRAST  Result Date: 02/20/2020 CLINICAL DATA:  Post thyroidectomy with tracheostomy possible infection EXAM: CT NECK WITH CONTRAST TECHNIQUE: Multidetector CT imaging of the neck was performed using the standard protocol following the bolus administration of intravenous contrast. CONTRAST:  95mL OMNIPAQUE IOHEXOL 300 MG/ML  SOLN COMPARISON:  None. FINDINGS: Pharynx and larynx: Tracheostomy  device present. Irregularity of the defect margins. Subglottic airway is patent. Mild laryngeal and pharyngeal wall thickening may reflect edema. Salivary glands: Unremarkable. Thyroid: Post thyroidectomy. Within the right thyroidectomy bed there is a peripherally enhancing, irregularly marginated fluid collection with foci of air likely due to communication with the tracheostomy site (series 7, image 88). Thicker hyperdense or enhancing tissue on the seen on series 7, image 85. Lymph nodes: No enlarged nodes identified. Vascular: Major neck vessels are patent. Limited intracranial: No abnormal enhancement. Left suboccipital craniotomy. Fluid collection superficial to the craniotomy extending posterior to the C1 arch, which is also been partially resected. This was present on prior head imaging. Right cerebral convexity subdural hematoma is partially imaged and likely similar to prior head CT. Visualized orbits: Left ocular prosthesis.  Otherwise unremarkable. Mastoids and visualized paranasal sinuses: No significant opacification. Skeleton: Cervical spine degenerative changes, greatest at C4-C5 and C5-C6. Upper chest: No apical lung mass or consolidation. Other: None. IMPRESSION: Post thyroidectomy with peripherally enhancing, irregularly marginated fluid collection within the surgical bed on the right back communicates with the  tracheostomy site. There is irregularity of the tracheostomy defect margins. Superimposed infection is therefore possible as suspected. Electronically Signed   By: Macy Mis M.D.   On: 02/20/2020 08:16   DG Swallowing Func-Speech Pathology  Result Date: 02/19/2020 Completed by [name], SLP Student Supervised and reviewed by Herbie Baltimore MA CCC-SLP Objective Swallowing Evaluation: Type of Study: MBS-Modified Barium Swallow Study  Patient Details Name: Taresa [place] MRN: [id number] Date of Birth: 1955-07-22 Today's Date: 02/19/2020 Time: SLP Start Time (ACUTE ONLY): 1250 -SLP  Stop Time (ACUTE ONLY): 1302 SLP Time Calculation (min) (ACUTE ONLY): 12 min Past Medical History: No past medical history on file. Past Surgical History: Past Surgical History: Procedure Laterality Date . CRANIOTOMY Left 01/30/2020  Procedure: LEFT FAR LATERAL CRANIOTOMY FOR ANEURYSM CLIPPING;  Surgeon: [name], MD;  Location: [facility];  Service: Neurosurgery;  Laterality: Left; . IR ANGIO INTRA EXTRACRAN SEL INTERNAL CAROTID BILAT MOD SED  01/30/2020 . IR ANGIO VERTEBRAL SEL VERTEBRAL UNI L MOD SED  01/30/2020 . RADIOLOGY WITH ANESTHESIA N/A 01/30/2020  Procedure: IR WITH ANESTHESIA;  Surgeon: [name], MD;  Location: [facility];  Service: Radiology;  Laterality: N/A; . THYROIDECTOMY N/A 02/08/2020  Procedure: THYROIDECTOMY;  Surgeon: [name], MD;  Location: [facility];  Service: ENT;  Laterality: N/A; . TRACHEOSTOMY TUBE PLACEMENT N/A 02/08/2020  Procedure: TRACHEOSTOMY;  Surgeon: [name], MD;  Location: [facility];  Service: ENT;  Laterality: N/A; HPI: 64 y/o presented with large subarachnoid hemorrhage due to ruptured PICA with resultant hydrocephalus. Admitted on 11/3, [place] on 11/4, vented until 11/12 when pt was trached. Also found to have multinodular goiter complicating respiratory failure so total thyroidectomy also completed on 11/12. Drains removed on 11/14. ATC on 11/15. Changed to 6 cuffledd on 11/17.  There is question of left lingual deviation in notes.  No data recorded Assessment / Plan / Recommendation CHL IP CLINICAL IMPRESSIONS 02/19/2020 Clinical Impression Pt was seen for MBS and was profoundly lethargic due to sedation. The pt has extremely abnormal pharyngeal and laryngeal anatomy with no upper airway or esophageal patency. Barium retained in upper pharynx until small portions trickled into what appears to be the vestibule and trachea. Tissue occludes all open spaces. Study d/c after one bolus given severity of dysphagia. Recommend long-term altnerative means of nutrition and  further evaluation of pharyngeal and laryngeal anatomy.  SLP Visit Diagnosis Dysphagia, pharyngeal phase (R13.13) Attention and concentration deficit following -- Frontal lobe and executive function deficit following -- Impact on safety and function Severe aspiration risk;Risk for inadequate nutrition/hydration   CHL IP TREATMENT RECOMMENDATION 02/19/2020 Treatment Recommendations Therapy as outlined in treatment plan below   Prognosis 02/19/2020 Prognosis for Safe Diet Advancement Guarded Barriers to Reach Goals Severity of deficits Barriers/Prognosis Comment -- CHL IP DIET RECOMMENDATION 02/19/2020 SLP Diet Recommendations NPO;Alternative means - long-term Liquid Administration via -- Medication Administration Via alternative means Compensations -- Postural Changes --   CHL IP OTHER RECOMMENDATIONS 02/19/2020 Recommended Consults -- Oral Care Recommendations Oral care QID Other Recommendations --   CHL IP FOLLOW UP RECOMMENDATIONS 02/19/2020 Follow up Recommendations Inpatient Rehab   CHL IP FREQUENCY AND DURATION 02/19/2020 Speech Therapy Frequency (ACUTE ONLY) min 2x/week Treatment Duration 2 weeks      CHL IP ORAL PHASE 02/19/2020 Oral Phase Impaired Oral - Pudding Teaspoon -- Oral - Pudding Cup -- Oral - Honey Teaspoon -- Oral - Honey Cup -- Oral - Nectar Teaspoon -- Oral - Nectar Cup -- Oral - Nectar Straw -- Oral - Thin  Teaspoon -- Oral - Thin Cup Delayed oral transit;Holding of bolus Oral - Thin Straw -- Oral - Puree -- Oral - Mech Soft -- Oral - Regular -- Oral - Multi-Consistency -- Oral - Pill -- Oral Phase - Comment --  CHL IP PHARYNGEAL PHASE 02/19/2020 Pharyngeal Phase Impaired Pharyngeal- Pudding Teaspoon -- Pharyngeal -- Pharyngeal- Pudding Cup -- Pharyngeal -- Pharyngeal- Honey Teaspoon -- Pharyngeal -- Pharyngeal- Honey Cup -- Pharyngeal -- Pharyngeal- Nectar Teaspoon -- Pharyngeal -- Pharyngeal- Nectar Cup -- Pharyngeal -- Pharyngeal- Nectar Straw -- Pharyngeal -- Pharyngeal- Thin Teaspoon --  Pharyngeal -- Pharyngeal- Thin Cup Trace aspiration;Other (Comment) Pharyngeal -- Pharyngeal- Thin Straw -- Pharyngeal -- Pharyngeal- Puree -- Pharyngeal -- Pharyngeal- Mechanical Soft -- Pharyngeal -- Pharyngeal- Regular -- Pharyngeal -- Pharyngeal- Multi-consistency -- Pharyngeal -- Pharyngeal- Pill -- Pharyngeal -- Pharyngeal Comment --  No flowsheet data found. DeBlois, [name] 02/19/2020, 2:44 PM                   Scheduled Meds: . amLODipine  10 mg Per Tube Daily  . chlorhexidine gluconate (MEDLINE KIT)  15 mL Mouth Rinse BID  . Chlorhexidine Gluconate Cloth  6 each Topical Daily  . docusate  100 mg Per Tube BID  . feeding supplement (PROSource TF)  45 mL Per Tube BID  . free water  300 mL Per Tube Q4H  . heparin  5,000 Units Subcutaneous Q8H  . hydrALAZINE  25 mg Per Tube Q8H  . insulin aspart  0-20 Units Subcutaneous Q4H  . insulin detemir  12 Units Subcutaneous QHS  . labetalol  300 mg Per Tube Q8H  . levothyroxine  100 mcg Per Tube Q0600  . losartan  100 mg Per Tube Daily  . mouth rinse  15 mL Mouth Rinse 10 times per day  . metoCLOPramide  5 mg Per Tube Q6H  . pantoprazole sodium  40 mg Per Tube Daily  . polyethylene glycol  17 g Per Tube Daily  . polyvinyl alcohol  1 drop Left Eye BID  . sodium chloride flush  10-40 mL Intracatheter Q12H   Continuous Infusions: . 0.9 % NaCl with KCl 20 mEq / L 100 mL/hr at 02/20/20 0713  . ceFEPime (MAXIPIME) IV 2 g (02/20/20 0920)  . feeding supplement (OSMOLITE 1.2 CAL) Stopped (02/16/20 0955)  . potassium chloride 10 mEq (02/20/20 1030)     LOS: 21 days   Time spent= 20 mins    Khori Underberg Arsenio Loader, MD [facility]  If 7PM-7AM, please contact night-coverage  02/20/2020, 11:24 AM

## 2020-02-20 NOTE — Progress Notes (Signed)
  NEUROSURGERY PROGRESS NOTE   No issues overnight.   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 female SAHd# 22, clipping L PICA aneurysm. Neurologically stable with primarily lower cranial neuropathy, multiple intubation/extubation and tracheostomy requiring concurrent thyroidectomy.  PLAN: - Will need gastrostomy, likely in IR today - Appreciate TRH assistance with mgmt, cont antihypertensives - Cont synthroid - Trach care - Dispo pending, likely CIR   [name], MD [facility]

## 2020-02-20 NOTE — Progress Notes (Signed)
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  11/4, pt s/p L far-lateral craniotomy for clipping of PICA aneurysm. 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    PT Comments    Pt is making significant progress towards her goals. In support, she was able to transfer to stand with only modAx1 with minAx1 with assistance from rehab tech this date. She required a L knee block and intermittent R knee block due to quadriceps weakness resulting in knee buckling. She was able to stand 3x for bouts of ~30-45 sec with modAx1 and minAx1 while cuing pt to shift her weight between her legs to facilitate rhythmic movements required for gait. However, she continues to require modAx2 to come to sit and minAx2 to return to supine due to her deficits in sequencing. Will continue to follow acutely. Current recommendations remain appropriate.    Follow Up Recommendations  CIR;Supervision/Assistance - 24 hour     Equipment Recommendations  Wheelchair (measurements PT);Wheelchair cushion (measurements PT)    Recommendations for Other Services       Precautions / Restrictions Precautions Precautions: Fall;Other (comment) Precaution Comments: trach, wrist restraints Restrictions Weight Bearing Restrictions: No    Mobility  Bed Mobility Overal bed mobility: Needs Assistance Bed Mobility: Supine to Sit;Sit to Supine     Supine to sit: Mod assist;+2 for physical assistance;HOB elevated Sit to supine: HOB elevated;Min assist;+2 for physical assistance   General bed mobility comments: Cues provided to manage legs towards EOB, modAx2 for trunk and  legs to sit up but minAx2 to return to supine with tactile and verbal cues provided to lean onto L elbow.  Transfers Overall transfer level: Needs assistance Equipment used: 2 person hand held assist Transfers: Sit to/from Stand Sit to Stand: Min assist;Mod assist         General transfer comment: MinAx1 with modAx1 with L knee block provided to come to stand, cuing pt to push up through hands on bed. Tactile cues provided at hips to extend.   Ambulation/Gait             General Gait Details: Did not ambulate this date   Stairs             Wheelchair Mobility    Modified Rankin (Stroke Patients Only) Modified Rankin (Stroke Patients Only) Pre-Morbid Rankin Score: No symptoms Modified Rankin: Severe disability     Balance Overall balance assessment: Needs assistance Sitting-balance support: Feet supported;Bilateral upper extremity supported;Single extremity supported Sitting balance-Leahy Scale: Poor Sitting balance - Comments: Static sitting EOB with cues to lean anteriorly, progressing from modA to minA-min guard assist to maintain balance. Postural control: Posterior lean Standing balance support: Bilateral upper extremity supported Standing balance-Leahy Scale: Poor Standing balance comment: L knee block and modAx1 with minAx1 and intermittent R knee block when knee would buckle provided for standing bouts x3 lasting ~30-45 sec each bout. Cued pt to weight shift between legs during 1 bout.                             Cognition Arousal/Alertness: Awake/alert Behavior During Therapy: Flat affect;Impulsive Overall Cognitive Status: Impaired/Different from baseline Area  of Impairment: Attention;Following commands;Safety/judgement;Problem solving                   Current Attention Level: Sustained   Following Commands: Follows one step commands inconsistently;Follows one step commands with increased time Safety/Judgement: Decreased  awareness of safety;Decreased awareness of deficits   Problem Solving: Slow processing;Decreased initiation;Difficulty sequencing;Requires verbal cues General Comments: Pt impulsive with movements at times. Required continual monitoring to ensure pt did not pull at trach or leads, with success. Required increased time and repeated cues to have pt follow commands.       Exercises      General Comments General comments (skin integrity, edema, etc.): HR 120s, SpO2 >/= 98% with trach collar donned      Pertinent Vitals/Pain Pain Assessment: Faces Faces Pain Scale: No hurt Pain Intervention(s): Limited activity within patient's tolerance;Monitored during session;Repositioned    Home Living                      Prior Function            PT Goals (current goals can now be found in the care plan section) Acute Rehab PT Goals Patient Stated Goal: not stated PT Goal Formulation: With patient Time For Goal Achievement: 02/29/20 Potential to Achieve Goals: Fair Progress towards PT goals: Progressing toward goals    Frequency    Min 4X/week      PT Plan Current plan remains appropriate    Co-evaluation              AM-PAC PT "6 Clicks" Mobility   Outcome Measure  Help needed turning from your back to your side while in a flat bed without using bedrails?: A Little Help needed moving from lying on your back to sitting on the side of a flat bed without using bedrails?: A Lot Help needed moving to and from a bed to a chair (including a wheelchair)?: Total Help needed standing up from a chair using your arms (e.g., wheelchair or bedside chair)?: A Lot Help needed to walk in hospital room?: Total Help needed climbing 3-5 steps with a railing? : Total 6 Click Score: 10    End of Session Equipment Utilized During Treatment: Gait belt;Oxygen Activity Tolerance: Patient tolerated treatment well Patient left: in bed;with call bell/phone within reach;with bed alarm  set;with nursing/sitter in room Nurse Communication: Mobility status PT Visit Diagnosis: Hemiplegia and hemiparesis;Other symptoms and signs involving the nervous system (R29.898);Other abnormalities of gait and mobility (R26.89);Unsteadiness on feet (R26.81);Muscle weakness (generalized) (M62.81);Difficulty in walking, not elsewhere classified (R26.2) Hemiplegia - Right/Left: Left Hemiplegia - dominant/non-dominant: Non-dominant Hemiplegia - caused by: Other Nontraumatic intracranial hemorrhage     Time: 0903-0922 PT Time Calculation (min) (ACUTE ONLY): 19 min  Charges:  $Neuromuscular Re-education: 8-22 mins                     Moishe Spice, PT, DPT Acute Rehabilitation Services  Pager: [phone] Office: [phone]    Orvan Falconer 02/20/2020, 1:23 PM

## 2020-02-20 NOTE — Progress Notes (Signed)
IR requested by Salvadore Dom, NP for possible image-guided percutaneous gastrostomy tube placement- plan for procedure in IR tentatively for Friday 02/22/2020 pending IR scheduling.  Risks and benefits discussed with the patient including, but not limited to the need for a barium enema during the procedure, bleeding, infection, peritonitis, or damage to adjacent structures. All of the patient's daughter's questions were answered, she is agreeable to proceed. Consent obtained by patient's daughter, Boris Lown, via telephone- signed and in IR.  Please call IR with questions/concerns.   Bea Graff Dwan Hemmelgarn, PA-C 02/20/2020, 4:13 PM

## 2020-02-20 NOTE — Progress Notes (Signed)
Inpatient Rehab Admissions Coordinator:   Note pt failed MBSS, plan for G-tube.  Also undergoing workup for possible soft tissue infection in neck.  Will continue to follow.   Shann Medal, PT, DPT Admissions Coordinator [phone] 02/20/20  10:13 AM

## 2020-02-21 LAB — GLUCOSE, CAPILLARY
Glucose-Capillary: 103 mg/dL — ABNORMAL HIGH (ref 70–99)
Glucose-Capillary: 79 mg/dL (ref 70–99)
Glucose-Capillary: 81 mg/dL (ref 70–99)
Glucose-Capillary: 86 mg/dL (ref 70–99)
Glucose-Capillary: 90 mg/dL (ref 70–99)
Glucose-Capillary: 93 mg/dL (ref 70–99)

## 2020-02-21 NOTE — Progress Notes (Signed)
Paged MD regarding moderate amount of bloody mucus discovered in trach. RT also assess pt - together we provide trach care/cannula change and suctioning. VSS unchanged from usual although it is noted the patient remains tachy (which is baseline for her, I am told) and BP is elevated to SBP 180's. Discussed with Dr Tonie Griffith - "there's not a lot we can do other than monitor, but definitely call if it worsens." Also did ask if I could still give PRN iv hydralazine in light of this bleeding, and Chotiner is agreeable "yes, please give it." Notified charge RN Phyl who is aware and says "yes, it was pink discharge the other day - it is a little worse now but she did rip it out twice before and has been frequently suctioned over the past few days. That may have caused trauma."

## 2020-02-21 NOTE — Progress Notes (Signed)
PROGRESS NOTE    Stacy Moran  JHE:[id number] DOB: Sep 22, 1955 DOA: 01/30/2020 PCP: No primary care provider on file.   Brief Narrative:  64 y.o. 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   She had a tracheostomy and total thyroidectomy on 11/12; she has self-decanulated twice and is now in soft wrist restraints with a bedside sitter.   She has been recommended for G-tube placement.  She is awake and appears to be able to answer yes/no questions by head movements but is unable to talk.  She denies pain.  Appears to have cognitive function.Had aneurysm with clipping, prolonged intubation with enlarged thyroid.  She had thyroidectomy in order for trach to be placed.  She has been off the vent for a week and stable.  Needs routine trach care - Dr. Constance Holster is following.  Likely needs PEG - IR consult request placed.  Has sputum culture pending; on MBS she appears to have subglottal obstruction and so CT ordered and started on Cefepime.  Has developed HTN, DM while hospitalized.   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  Supportive care with concerns of infection therefore on cefepime.  Essential hypertension, uncontrolled -Currently on Cozaar, labetalol, amlodipine.  Hydralazine added. -Suspect some autonomic dysfunction  Prediabetes -No known medical problems PTA -A1c on 11/3 was 6.2 -Sliding scale -Levemir 12 units discontinued until her feeding has been  restarted.  Dysphagia -Failed swallow evaluation -G-tube placement postponed until 02/22/2020.  Patient pulled out her temporary feeding tube  Thyroidectomy -Status post total thyroidectomy.  Follow-up with ENT -Daily Synthroid   Labs from today are pending, if potassium levels are normal her potassium supplements will need to be discontinued   Body mass index is 21.19 kg/m.  Pressure Injury 02/12/20 Throat Mid Stage 2 -  Partial thickness loss of dermis presenting as a shallow open injury with a red, pink wound bed without slough. (Active)  02/12/20 1500  Location: Throat  Location Orientation: Mid  Staging: Stage 2 -  Partial thickness loss of dermis presenting as a shallow open injury with a red, pink wound bed without slough.  Wound Description (Comments):   Present on Admission: No      Subjective: Patient pulled out her feeding tube, unable to place G-tube yesterday due to scheduling therefore tentative plans tomorrow.  No complaints this morning she is resting okay.   Examination:  Constitutional: Appears chronically ill, tracheostomy in place Respiratory: Slightly rhonchorous anterior breath sounds Cardiovascular: Sinus tachycardia Abdomen: Nontender nondistended good bowel sounds Musculoskeletal: No edema noted Skin: No rashes seen Neurologic: CN 2-12 grossly intact.  And nonfocal Psychiatric: Poor judgment and insight. Alert and oriented to name and place only Objective: Vitals:   02/21/20 0345 02/21/20 0346 02/21/20 0800 02/21/20 0822  BP:  (!) 155/82    Pulse: (!) 110 98 (!) 103 (!) 122  Resp: _0 (!) 25  Temp:  98.1 F (36.7 C)    TempSrc:  Oral    SpO2: 100% 100% 100%  100%  Weight:      Height:        Intake/Output Summary (Last 24 hours) at 02/21/2020 1016 Last data filed at 02/21/2020 0700 Gross per 24 hour  Intake --  Output 1425 ml  Net -1425 ml   Filed Weights   02/18/20 0349 02/19/20 0500 02/20/20 0425  Weight: 68.3 kg 66.8  kg 65.1 kg     Data Reviewed:   CBC: Recent Labs  Lab 02/15/20 0506 02/18/20 1831 02/20/20 0334  WBC 12.5* 7.4 7.4  NEUTROABS  --   --  5.9  HGB 10.2* 10.0* 10.4*  HCT 31.9* 31.6* 33.6*  MCV 82.9 82.5 83.8  PLT PLATELET CLUMPS NOTED ON SMEAR, UNABLE TO ESTIMATE 289 060   Basic Metabolic Panel: Recent Labs  Lab 02/15/20 0506 02/20/20 0334  NA 135 140  K 3.0* 3.3*  CL 101 104  CO2 23 22  GLUCOSE 100* 99  BUN 18 9  CREATININE 0.62 0.66  CALCIUM 8.3* 8.6*  MG 2.3  --    GFR: Estimated Creatinine Clearance: 74.9 mL/min (by C-G formula based on SCr of 0.66 mg/dL). Liver Function Tests: Recent Labs  Lab 02/20/20 0334  AST 26  ALT 39  ALKPHOS 76  BILITOT 1.5*  PROT 6.1*  ALBUMIN 2.6*   No results for input(s): LIPASE, AMYLASE in the last 168 hours. No results for input(s): AMMONIA in the last 168 hours. Coagulation Profile: No results for input(s): INR, PROTIME in the last 168 hours. Cardiac Enzymes: No results for input(s): CKTOTAL, CKMB, CKMBINDEX, TROPONINI in the last 168 hours. BNP (last 3 results) No results for input(s): PROBNP in the last 8760 hours. HbA1C: No results for input(s): HGBA1C in the last 72 hours. CBG: Recent Labs  Lab 02/20/20 1624 02/20/20 2038 02/20/20 2306 02/21/20 0409 02/21/20 0804  GLUCAP 96 80 77 103* 93   Lipid Profile: No results for input(s): CHOL, HDL, LDLCALC, TRIG, CHOLHDL, LDLDIRECT in the last 72 hours. Thyroid Function Tests: No results for input(s): TSH, T4TOTAL, FREET4, T3FREE, THYROIDAB in the last 72 hours. Anemia Panel: No results for input(s): VITAMINB12, FOLATE, FERRITIN, TIBC, IRON, RETICCTPCT in the last 72 hours. Sepsis Labs: No results for input(s): PROCALCITON, LATICACIDVEN in the last 168 hours.  Recent Results (from the past 240 hour(s))  Culture, respiratory (non-expectorated)     Status: None   Collection Time: 02/16/20  4:02 PM   Specimen: Tracheal Aspirate; Respiratory  Result Value Ref  Range Status   Specimen Description TRACHEAL ASPIRATE  Final   Special Requests NONE  Final   Gram Stain   Final    ABUNDANT WBC PRESENT, PREDOMINANTLY PMN ABUNDANT GRAM NEGATIVE RODS MODERATE GRAM POSITIVE COCCI IN PAIRS FEW GRAM POSITIVE RODS    Culture   Final    Consistent with normal respiratory flora. No Pseudomonas species isolated Performed at [facility] [address] [place], [place] [address]    Report Status 02/18/2020 FINAL  Final  Aerobic Culture (superficial specimen)     Status: None   Collection Time: 02/18/20  9:43 PM   Specimen: Wound  Result Value Ref Range Status   Specimen Description WOUND NECK  Final   Special Requests NONE  Final   Gram Stain   Final    MODERATE WBC PRESENT,BOTH PMN AND MONONUCLEAR ABUNDANT GRAM POSITIVE COCCI ABUNDANT GRAM NEGATIVE RODS    Culture   Final    FEW MULTIPLE ORGANISMS PRESENT, NONE PREDOMINANT NO STAPHYLOCOCCUS AUREUS ISOLATED NO GROUP A  STREP (S.PYOGENES) ISOLATED Performed at [facility], [place] [address], [place], [place] [address]    Report Status 02/20/2020 FINAL  Final         Radiology Studies: CT ABDOMEN WO CONTRAST  Result Date: 02/20/2020 CLINICAL DATA:  Evaluate anatomy prior to potential percutaneous gastrostomy tube placement. EXAM: CT ABDOMEN WITHOUT CONTRAST TECHNIQUE: Multidetector CT imaging of the abdomen was performed following the standard protocol without IV contrast. COMPARISON:  None. FINDINGS: The lack of intravenous contrast limits the ability to evaluate solid abdominal organs. Lower chest: Limited visualization of lower thorax demonstrates minimal dependent subpleural ground-glass atelectasis. No pleural effusion. Cardiomegaly. Trace amount of pericardial fluid, potentially physiologic. Hepatobiliary: Normal hepatic contour. Normal noncontrast appearance of the gallbladder given degree distention, though note, evaluation further degraded secondary to significant streak  artifact from adjacent monitoring apparatus. No ascites. Pancreas: The pancreas appears atrophic. Spleen: Normal noncontrast appearance of the spleen. Adrenals/Urinary Tract: Normal noncontrast appearance of the bilateral kidneys. No renal stones. No urine obstruction or perinephric stranding. Mild thickening of the crux of left adrenal gland without discrete nodule. Normal noncontrast appearance of the right adrenal gland. The urinary bladder was not imaged. Stomach/Bowel: The anterior wall of the mid/distal aspect of the stomach is well apposed against the ventral wall of the abdomen without interposition of either the hepatic parenchyma or the transverse colon and will likely be improved with gastric insufflation (representative image 24, series 3). Moderate liquid stool burden. No evidence of enteric obstruction. Ingested radiopaque debris is seen at the level of the hepatic flexure of the colon. Normal noncontrast appearance of the appendix. No pneumoperitoneum, pneumatosis or portal venous gas. Vascular/Lymphatic: Normal caliber of the abdominal aorta. No bulky retroperitoneal or mesenteric lymphadenopathy on this noncontrast examination. Other: Diffuse body wall anasarca, most conspicuous about the midline of the low back. Musculoskeletal: No acute or aggressive osseous abnormalities. IMPRESSION: 1. Gastric anatomy amenable to potential percutaneous gastrostomy tube placement as indicated. 2. Cardiomegaly. Electronically Signed   By: Sandi Mariscal M.D.   On: 02/20/2020 09:52   CT SOFT TISSUE NECK W CONTRAST  Result Date: 02/20/2020 CLINICAL DATA:  Post thyroidectomy with tracheostomy possible infection EXAM: CT NECK WITH CONTRAST TECHNIQUE: Multidetector CT imaging of the neck was performed using the standard protocol following the bolus administration of intravenous contrast. CONTRAST:  21m OMNIPAQUE IOHEXOL 300 MG/ML  SOLN COMPARISON:  None. FINDINGS: Pharynx and larynx: Tracheostomy device present.  Irregularity of the defect margins. Subglottic airway is patent. Mild laryngeal and pharyngeal wall thickening may reflect edema. Salivary glands: Unremarkable. Thyroid: Post thyroidectomy. Within the right thyroidectomy bed there is a peripherally enhancing, irregularly marginated fluid collection with foci of air likely due to communication with the tracheostomy site (series 7, image 88). Thicker hyperdense or enhancing tissue on the seen on series 7, image 85. Lymph nodes: No enlarged nodes identified. Vascular: Major neck vessels are patent. Limited intracranial: No abnormal enhancement. Left suboccipital craniotomy. Fluid collection superficial to the craniotomy extending posterior to the C1 arch, which is also been partially resected. This was present on prior head imaging. Right cerebral convexity subdural hematoma is partially imaged and likely similar to prior head CT. Visualized orbits: Left ocular prosthesis.  Otherwise unremarkable. Mastoids and visualized paranasal sinuses: No significant opacification. Skeleton: Cervical spine degenerative changes, greatest at C4-C5 and C5-C6. Upper chest: No apical lung mass or consolidation. Other: None. IMPRESSION: Post thyroidectomy with peripherally enhancing, irregularly marginated fluid collection within the surgical bed on the right back communicates  with the tracheostomy site. There is irregularity of the tracheostomy defect margins. Superimposed infection is therefore possible as suspected. Electronically Signed   By: Macy Mis M.D.   On: 02/20/2020 08:16   DG Swallowing Func-Speech Pathology  Result Date: 02/19/2020 Completed by [name], SLP Student Supervised and reviewed by Herbie Baltimore MA CCC-SLP Objective Swallowing Evaluation: Type of Study: MBS-Modified Barium Swallow Study  Patient Details Name: Dyneisha Murchison MRN: [id number] Date of Birth: March 17, 1956 Today's Date: 02/19/2020 Time: SLP Start Time (ACUTE ONLY): 1250 -SLP Stop Time (ACUTE  ONLY): 1302 SLP Time Calculation (min) (ACUTE ONLY): 12 min Past Medical History: No past medical history on file. Past Surgical History: Past Surgical History: Procedure Laterality Date . CRANIOTOMY Left 01/30/2020  Procedure: LEFT FAR LATERAL CRANIOTOMY FOR ANEURYSM CLIPPING;  Surgeon: [name], MD;  Location: [facility];  Service: Neurosurgery;  Laterality: Left; . IR ANGIO INTRA EXTRACRAN SEL INTERNAL CAROTID BILAT MOD SED  01/30/2020 . IR ANGIO VERTEBRAL SEL VERTEBRAL UNI L MOD SED  01/30/2020 . RADIOLOGY WITH ANESTHESIA N/A 01/30/2020  Procedure: IR WITH ANESTHESIA;  Surgeon: [name], MD;  Location: [facility];  Service: Radiology;  Laterality: N/A; . THYROIDECTOMY N/A 02/08/2020  Procedure: THYROIDECTOMY;  Surgeon: [name], MD;  Location: [facility];  Service: ENT;  Laterality: N/A; . TRACHEOSTOMY TUBE PLACEMENT N/A 02/08/2020  Procedure: TRACHEOSTOMY;  Surgeon: [name], MD;  Location: [facility];  Service: ENT;  Laterality: N/A; HPI: 64 y/o presented with large subarachnoid hemorrhage due to ruptured PICA with resultant hydrocephalus. Admitted on 11/3, [place] on 11/4, vented until 11/12 when pt was trached. Also found to have multinodular goiter complicating respiratory failure so total thyroidectomy also completed on 11/12. Drains removed on 11/14. ATC on 11/15. Changed to 6 cuffledd on 11/17.  There is question of left lingual deviation in notes.  No data recorded Assessment / Plan / Recommendation CHL IP CLINICAL IMPRESSIONS 02/19/2020 Clinical Impression Pt was seen for MBS and was profoundly lethargic due to sedation. The pt has extremely abnormal pharyngeal and laryngeal anatomy with no upper airway or esophageal patency. Barium retained in upper pharynx until small portions trickled into what appears to be the vestibule and trachea. Tissue occludes all open spaces. Study d/c after one bolus given severity of dysphagia. Recommend long-term altnerative means of nutrition and further evaluation of  pharyngeal and laryngeal anatomy.  SLP Visit Diagnosis Dysphagia, pharyngeal phase (R13.13) Attention and concentration deficit following -- Frontal lobe and executive function deficit following -- Impact on safety and function Severe aspiration risk;Risk for inadequate nutrition/hydration   CHL IP TREATMENT RECOMMENDATION 02/19/2020 Treatment Recommendations Therapy as outlined in treatment plan below   Prognosis 02/19/2020 Prognosis for Safe Diet Advancement Guarded Barriers to Reach Goals Severity of deficits Barriers/Prognosis Comment -- CHL IP DIET RECOMMENDATION 02/19/2020 SLP Diet Recommendations NPO;Alternative means - long-term Liquid Administration via -- Medication Administration Via alternative means Compensations -- Postural Changes --   CHL IP OTHER RECOMMENDATIONS 02/19/2020 Recommended Consults -- Oral Care Recommendations Oral care QID Other Recommendations --   CHL IP FOLLOW UP RECOMMENDATIONS 02/19/2020 Follow up Recommendations Inpatient Rehab   CHL IP FREQUENCY AND DURATION 02/19/2020 Speech Therapy Frequency (ACUTE ONLY) min 2x/week Treatment Duration 2 weeks      CHL IP ORAL PHASE 02/19/2020 Oral Phase Impaired Oral - Pudding Teaspoon -- Oral - Pudding Cup -- Oral - Honey Teaspoon -- Oral - Honey Cup -- Oral - Nectar Teaspoon -- Oral - Nectar Cup -- Oral - Nectar Straw -- Oral -  Thin Teaspoon -- Oral - Thin Cup Delayed oral transit;Holding of bolus Oral - Thin Straw -- Oral - Puree -- Oral - Mech Soft -- Oral - Regular -- Oral - Multi-Consistency -- Oral - Pill -- Oral Phase - Comment --  CHL IP PHARYNGEAL PHASE 02/19/2020 Pharyngeal Phase Impaired Pharyngeal- Pudding Teaspoon -- Pharyngeal -- Pharyngeal- Pudding Cup -- Pharyngeal -- Pharyngeal- Honey Teaspoon -- Pharyngeal -- Pharyngeal- Honey Cup -- Pharyngeal -- Pharyngeal- Nectar Teaspoon -- Pharyngeal -- Pharyngeal- Nectar Cup -- Pharyngeal -- Pharyngeal- Nectar Straw -- Pharyngeal -- Pharyngeal- Thin Teaspoon -- Pharyngeal -- Pharyngeal-  Thin Cup Trace aspiration;Other (Comment) Pharyngeal -- Pharyngeal- Thin Straw -- Pharyngeal -- Pharyngeal- Puree -- Pharyngeal -- Pharyngeal- Mechanical Soft -- Pharyngeal -- Pharyngeal- Regular -- Pharyngeal -- Pharyngeal- Multi-consistency -- Pharyngeal -- Pharyngeal- Pill -- Pharyngeal -- Pharyngeal Comment --  No flowsheet data found. DeBlois, [name] 02/19/2020, 2:44 PM                   Scheduled Meds: . amLODipine  10 mg Per Tube Daily  . chlorhexidine gluconate (MEDLINE KIT)  15 mL Mouth Rinse BID  . Chlorhexidine Gluconate Cloth  6 each Topical Daily  . docusate  100 mg Per Tube BID  . feeding supplement (PROSource TF)  45 mL Per Tube BID  . free water  300 mL Per Tube Q4H  . heparin  5,000 Units Subcutaneous Q8H  . hydrALAZINE  25 mg Per Tube Q8H  . insulin aspart  0-20 Units Subcutaneous Q4H  . insulin detemir  12 Units Subcutaneous QHS  . labetalol  300 mg Per Tube Q8H  . levothyroxine  100 mcg Per Tube Q0600  . losartan  100 mg Per Tube Daily  . mouth rinse  15 mL Mouth Rinse 10 times per day  . metoCLOPramide  5 mg Per Tube Q6H  . pantoprazole sodium  40 mg Per Tube Daily  . polyethylene glycol  17 g Per Tube Daily  . polyvinyl alcohol  1 drop Left Eye BID  . sodium chloride flush  10-40 mL Intracatheter Q12H   Continuous Infusions: . 0.9 % NaCl with KCl 20 mEq / L 100 mL/hr at 02/21/20 0205  . ceFEPime (MAXIPIME) IV 2 g (02/20/20 2220)  . feeding supplement (OSMOLITE 1.2 CAL) Stopped (02/16/20 0955)     LOS: 22 days   Time spent= 20 mins     Arsenio Loader, MD [facility]  If 7PM-7AM, please contact night-coverage  02/21/2020, 10:16 AM

## 2020-02-21 NOTE — Progress Notes (Signed)
Bloody secretions noted.

## 2020-02-21 NOTE — Progress Notes (Signed)
NEUROSURGERY PROGRESS NOTE  No acute events overnight. Gtube placed yesterday in IR. MAE, Awaiting CIR placement.    Temp:  [98.1 F (36.7 C)-98.9 F (37.2 C)] 98.1 F (36.7 C) (11/25 0346) Pulse Rate:  [91-122] 122 (11/25 0822) Resp:  [16-25] 25 (11/25 0822) BP: (155-190)/(80-95) 155/82 (11/25 0346) SpO2:  [98 %-100 %] 100 % (11/25 0822) FiO2 (%):  [21 %] 21 % (11/25 0822)  Eleonore Chiquito, NP 02/21/2020 8:30 AM

## 2020-02-22 ENCOUNTER — Inpatient Hospital Stay (HOSPITAL_COMMUNITY): Payer: Self-pay

## 2020-02-22 HISTORY — PX: IR GASTROSTOMY TUBE MOD SED: IMG625

## 2020-02-22 LAB — CBC
HCT: 31.8 % — ABNORMAL LOW (ref 36.0–46.0)
Hemoglobin: 9.7 g/dL — ABNORMAL LOW (ref 12.0–15.0)
MCH: 25.9 pg — ABNORMAL LOW (ref 26.0–34.0)
MCHC: 30.5 g/dL (ref 30.0–36.0)
MCV: 84.8 fL (ref 80.0–100.0)
Platelets: 317 10*3/uL (ref 150–400)
RBC: 3.75 MIL/uL — ABNORMAL LOW (ref 3.87–5.11)
RDW: 18 % — ABNORMAL HIGH (ref 11.5–15.5)
WBC: 3.7 10*3/uL — ABNORMAL LOW (ref 4.0–10.5)
nRBC: 0 % (ref 0.0–0.2)

## 2020-02-22 LAB — BASIC METABOLIC PANEL
Anion gap: 16 — ABNORMAL HIGH (ref 5–15)
BUN: 8 mg/dL (ref 8–23)
CO2: 20 mmol/L — ABNORMAL LOW (ref 22–32)
Calcium: 8.5 mg/dL — ABNORMAL LOW (ref 8.9–10.3)
Chloride: 108 mmol/L (ref 98–111)
Creatinine, Ser: 0.68 mg/dL (ref 0.44–1.00)
GFR, Estimated: >60 mL/min (ref >60)
Glucose, Bld: 93 mg/dL (ref 70–99)
Potassium: 3.5 mmol/L (ref 3.5–5.1)
Sodium: 144 mmol/L (ref 135–145)

## 2020-02-22 LAB — GLUCOSE, CAPILLARY
Glucose-Capillary: 86 mg/dL (ref 70–99)
Glucose-Capillary: 86 mg/dL (ref 70–99)
Glucose-Capillary: 84 mg/dL (ref 70–99)
Glucose-Capillary: 100 mg/dL — ABNORMAL HIGH (ref 70–99)
Glucose-Capillary: 118 mg/dL — ABNORMAL HIGH (ref 70–99)
Glucose-Capillary: 132 mg/dL — ABNORMAL HIGH (ref 70–99)

## 2020-02-22 LAB — PROTIME-INR
INR: 1.1 (ref 0.8–1.2)
Prothrombin Time: 13.3 seconds (ref 11.4–15.2)

## 2020-02-22 LAB — MAGNESIUM: Magnesium: 2.3 mg/dL (ref 1.7–2.4)

## 2020-02-22 LAB — HEPARIN LEVEL (UNFRACTIONATED): Heparin Unfractionated: <0.1 IU/mL — ABNORMAL LOW (ref 0.30–0.70)

## 2020-02-22 IMAGING — XA IR PERC PLACEMENT GASTROSTOMY
1 series · 3 of 3 positions shown · non-contrast
Comparison: none

INDICATION: 64-year-old female with a history of dysphagia

[Series 300: dsa body · 3 of 3 slices shown]
[im 1/3]
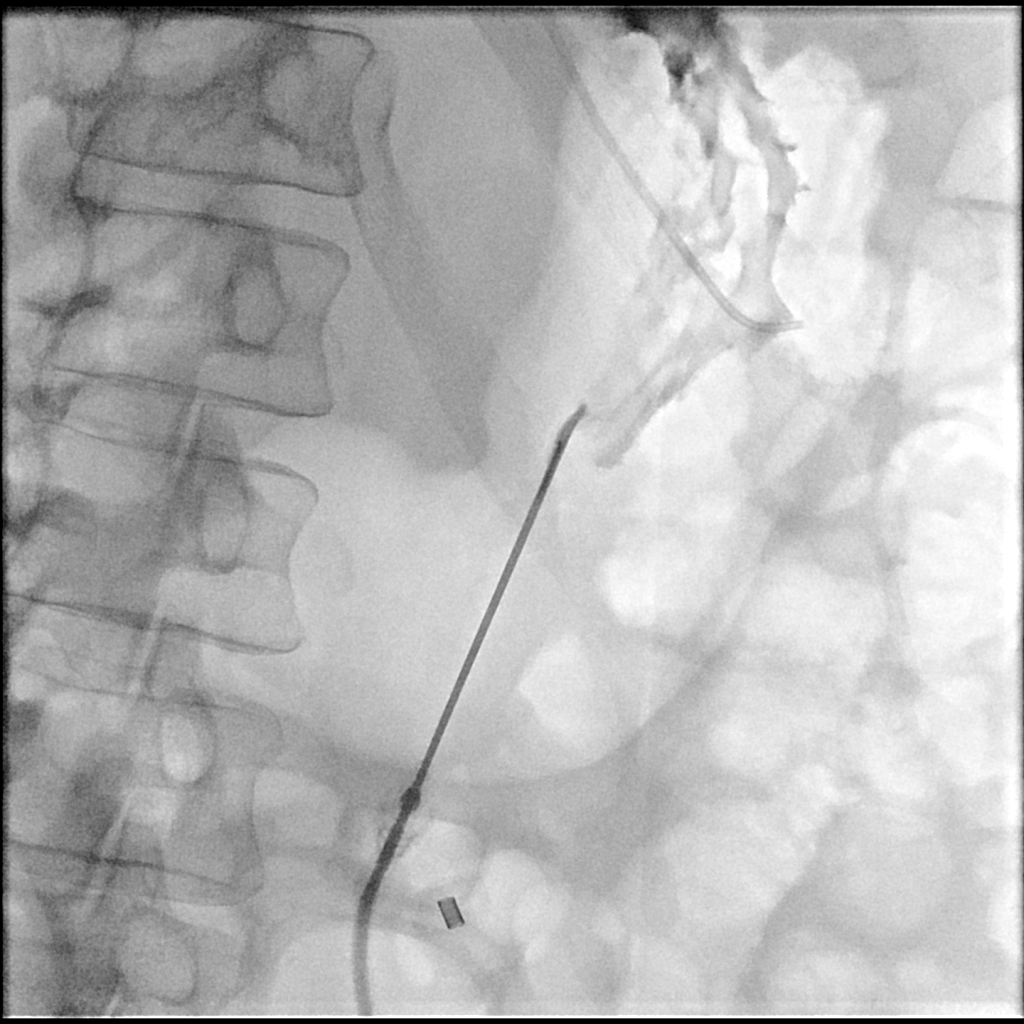
[im 2/3]
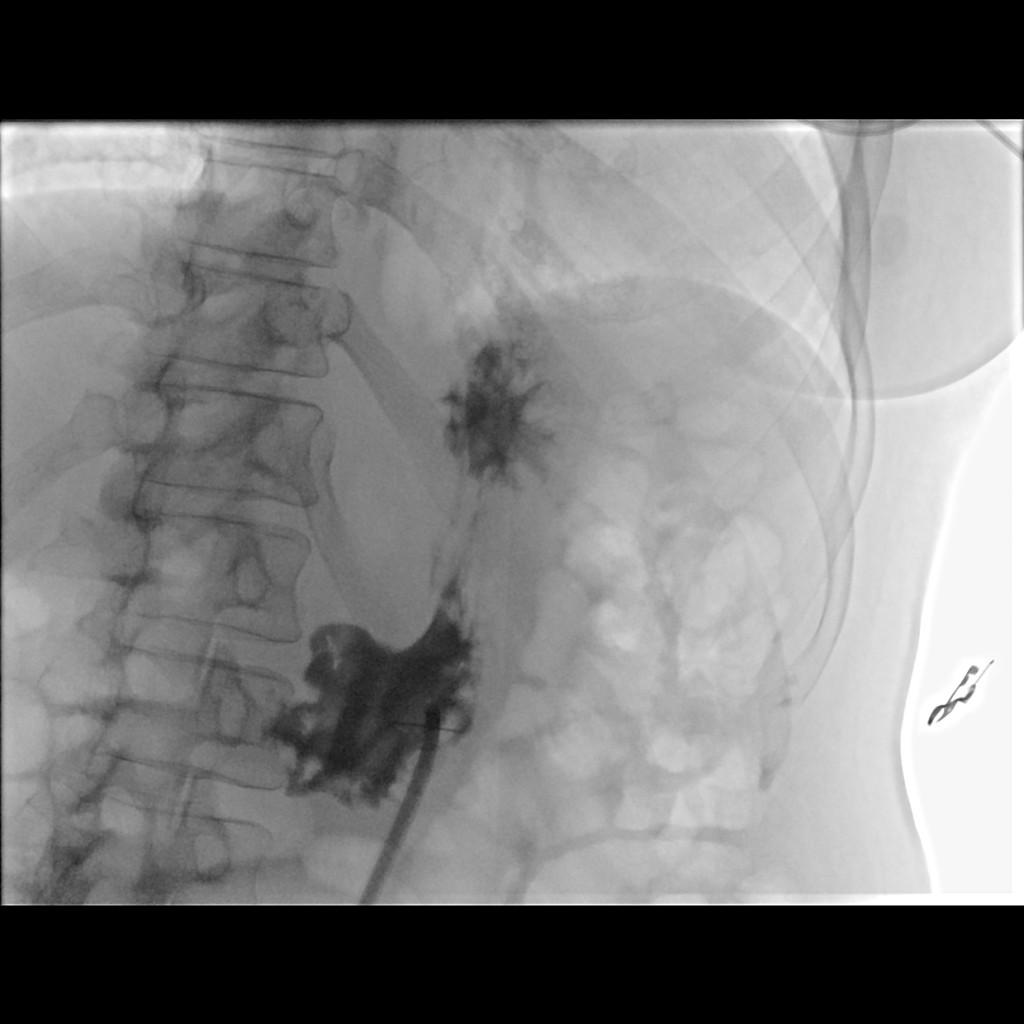
[im 3/3]
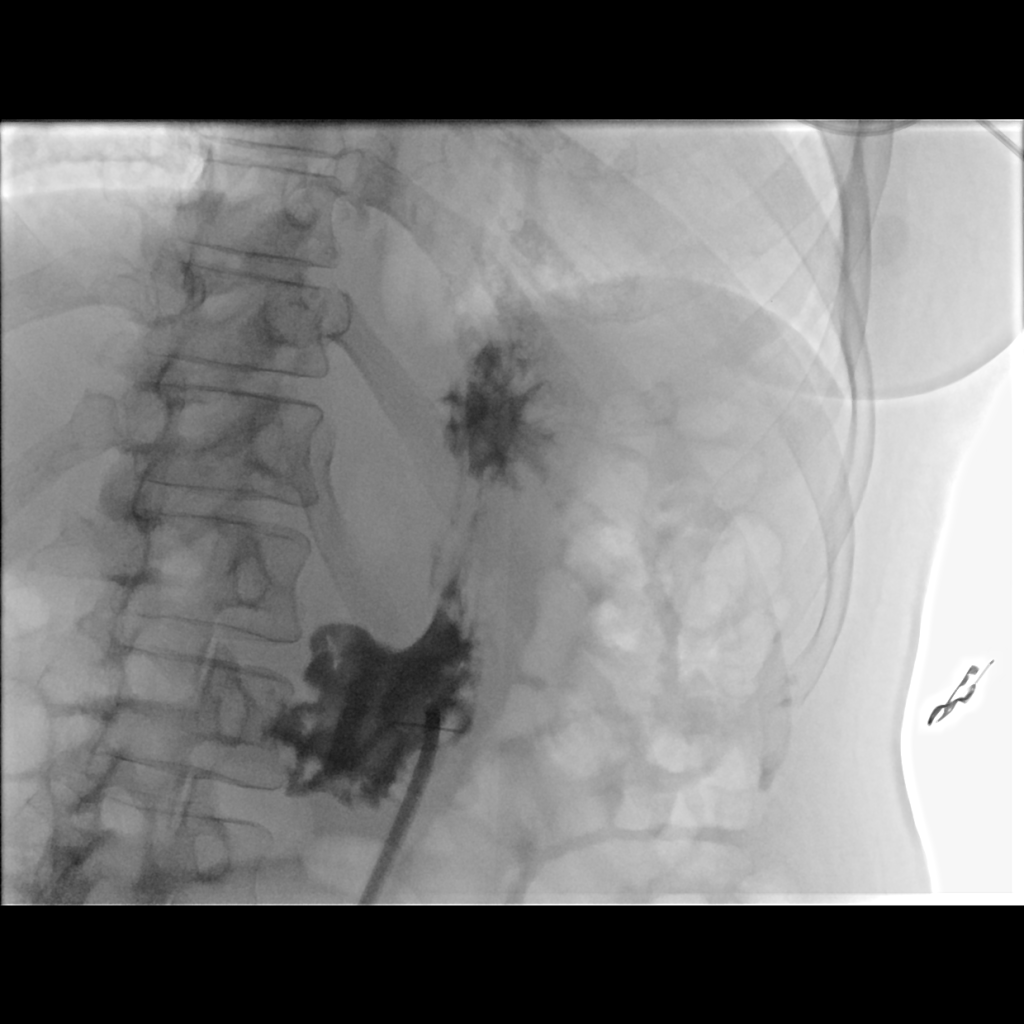

[3 of 3 positions shown; findings below may reference images not displayed]

EXAM:
PERC PLACEMENT GASTROSTOMY

MEDICATIONS:
2 g Ancef; Antibiotics were administered within 1 hour of the
procedure.

ANESTHESIA/SEDATION:
Versed 2.0 mg IV; Fentanyl 100 mcg IV

Moderate Sedation Time:  11 minutes

The patient was continuously monitored during the procedure by the
interventional radiology nurse under my direct supervision.

CONTRAST:  20mL OMNIPAQUE IOHEXOL 300 MG/ML SOLN - administered into
the gastric lumen.

FLUOROSCOPY TIME:  Fluoroscopy Time: 2 minutes 18 seconds (42 mGy).

COMPLICATIONS:
None

PROCEDURE:
Informed written consent was obtained from the patient and the
patient's family after a thorough discussion of the procedural
risks, benefits and alternatives. All questions were addressed.
Maximal Sterile Barrier Technique was utilized including caps, mask,
sterile gowns, sterile gloves, sterile drape, hand hygiene and skin
antiseptic. A timeout was performed prior to the initiation of the
procedure.

The epigastrium was prepped with Betadine in a sterile fashion, and
a sterile drape was applied covering the operative field. A sterile
gown and sterile gloves were used for the procedure.

A 5-French orogastric tube is placed under fluoroscopic guidance.
Scout imaging of the abdomen confirms barium within the transverse
colon.

The stomach was distended with gas. Under fluoroscopic guidance, an
18 gauge needle was utilized to puncture the anterior wall of the
body of the stomach. An Amplatz wire was advanced through the needle
passing a T fastener into the lumen of the stomach. The T fastener
was secured for gastropexy. A 9-French sheath was inserted.

A snare was advanced through the 9-French sheath. BRAYAN SOTERO was
advanced through the orogastric tube. It was snared then pulled out
the oral cavity, pulling the snare, as well. The leading edge of the
gastrostomy was attached to the snare. It was then pulled down the
esophagus and out the percutaneous site. Tube secured in place.
Contrast was injected.

Patient tolerated the procedure well and remained hemodynamically
stable throughout.

No complications were encountered and no significant blood loss
encountered.
IMPRESSION: Status post fluoroscopic placed percutaneous gastrostomy tube, with
20 French pull-through.

## 2020-02-22 MED ORDER — CEFAZOLIN SODIUM-DEXTROSE 2-4 GM/100ML-% IV SOLN
INTRAVENOUS | Status: AC
  Filled 2020-02-22: qty 100

## 2020-02-22 MED ORDER — CEFAZOLIN SODIUM-DEXTROSE 2-4 GM/100ML-% IV SOLN
2.0000 g | Freq: Once | INTRAVENOUS | Status: DC
  Filled 2020-02-22: qty 100

## 2020-02-22 MED ORDER — FENTANYL CITRATE (PF) 100 MCG/2ML IJ SOLN
INTRAMUSCULAR | Status: AC
  Filled 2020-02-22: qty 2

## 2020-02-22 MED ORDER — LIDOCAINE-EPINEPHRINE 1 %-1:100000 IJ SOLN
INTRAMUSCULAR | Status: AC | PRN
  Administered 2020-02-22: 10 mL

## 2020-02-22 MED ORDER — DEXTROSE-NACL 5-0.45 % IV SOLN: INTRAVENOUS | Status: AC

## 2020-02-22 MED ORDER — IOHEXOL 300 MG/ML  SOLN: 50.0000 mL | Freq: Once | INTRAMUSCULAR | Status: DC | PRN

## 2020-02-22 MED ORDER — CEFAZOLIN SODIUM-DEXTROSE 2-4 GM/100ML-% IV SOLN
INTRAVENOUS | Status: AC | PRN
  Administered 2020-02-22: 2 g via INTRAVENOUS

## 2020-02-22 MED ORDER — MIDAZOLAM HCL 2 MG/2ML IJ SOLN
INTRAMUSCULAR | Status: AC
  Filled 2020-02-22: qty 2

## 2020-02-22 MED ORDER — BACITRACIN-NEOMYCIN-POLYMYXIN 400-5-5000 EX OINT
1.0000 "application " | TOPICAL_OINTMENT | Freq: Every day | CUTANEOUS | Status: AC
  Administered 2020-02-23 – 2020-02-28 (×6): 1 via TOPICAL
  Filled 2020-02-22 (×3): qty 1

## 2020-02-22 MED ORDER — LIDOCAINE-EPINEPHRINE 1 %-1:100000 IJ SOLN
INTRAMUSCULAR | Status: AC
  Filled 2020-02-22: qty 1

## 2020-02-22 MED ORDER — MIDAZOLAM HCL 2 MG/2ML IJ SOLN
INTRAMUSCULAR | Status: AC | PRN
  Administered 2020-02-22 (×2): 1 mg via INTRAVENOUS

## 2020-02-22 MED ORDER — IOHEXOL 300 MG/ML  SOLN
50.0000 mL | Freq: Once | INTRAMUSCULAR | Status: AC | PRN
  Administered 2020-02-22: 20 mL

## 2020-02-22 MED ORDER — FENTANYL CITRATE (PF) 100 MCG/2ML IJ SOLN
INTRAMUSCULAR | Status: AC | PRN
Start: 2020-02-22 — End: 2020-02-22
  Administered 2020-02-22 (×2): 50 ug via INTRAVENOUS

## 2020-02-22 NOTE — Procedures (Signed)
Interventional Radiology Procedure Note  Procedure: Placement of percutaneous 20F pull-through gastrostomy tube. Complications: None Recommendations: - NPO except for sips and chips remainder of today and overnight - Maintain G-tube to LWS until tomorrow morning  - May advance diet as tolerated and begin using tube tomorrow morning  Signed,   Conlan Miceli S. Damascus Feldpausch, DO   

## 2020-02-22 NOTE — Progress Notes (Signed)
PROGRESS NOTE    Stacy Moran  OZD:[id number] DOB: 1955-10-20 DOA: 01/30/2020 PCP: No primary care provider on file.   Brief Narrative:  64 y.o. 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   She had a tracheostomy and total thyroidectomy on 11/12; she has self-decanulated twice and is now in soft wrist restraints with a bedside sitter.   She has been recommended for G-tube placement.  She is awake and appears to be able to answer yes/no questions by head movements but is unable to talk.  She denies pain.  Appears to have cognitive function.Had aneurysm with clipping, prolonged intubation with enlarged thyroid.  She had thyroidectomy in order for trach to be placed.  She has been off the vent for a week and stable.  Needs routine trach care - Dr. Constance Holster is following.  Likely needs PEG - IR consult request placed.  Has sputum culture pending; on MBS she appears to have subglottal obstruction and so CT ordered and started on Cefepime.  Has developed HTN, DM while hospitalized.   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  Supportive care with concerns of infection therefore on cefepime.  Closely monitor bleeding and hemoglobin.  Essential hypertension, uncontrolled -Currently on Cozaar, labetalol, amlodipine.  Hydralazine added. -Suspect some autonomic dysfunction  Prediabetes -No known medical problems  PTA -A1c on 11/3 was 6.2 -Sliding scale -Levemir 12 units discontinued until her feeding has been restarted.  Dysphagia -Failed swallow evaluation -G-tube placement postponed until 02/22/2020.  Patient pulled out her temporary feeding tube, until her G-tube has been placed will place her on D5 half-normal saline at 75 cc/h.  Closely monitor her blood glucose levels.  Thyroidectomy -Status post total thyroidectomy.  Follow-up with ENT -Daily Synthroid    Body mass index is 21.19 kg/m.  Pressure Injury 02/12/20 Throat Mid Stage 2 -  Partial thickness loss of dermis presenting as a shallow open injury with a red, pink wound bed without slough. (Active)  02/12/20 1500  Location: Throat  Location Orientation: Mid  Staging: Stage 2 -  Partial thickness loss of dermis presenting as a shallow open injury with a red, pink wound bed without slough.  Wound Description (Comments):   Present on Admission: No      Subjective: Laying in the bed no complaints.  Still quite weak.   Examination: Constitutional: Appears chronically ill, tracheostomy in place Respiratory: Slight rhonchorous breath sounds anteriorly Cardiovascular: Normal sinus rhythm, no rubs Abdomen: Nontender nondistended good bowel sounds Musculoskeletal: No edema noted Skin: No rashes seen Neurologic: CN 2-12 grossly intact.  And nonfocal Psychiatric: Alert to name and place, poor judgment and insight.  Objective: Vitals:   02/22/20 0509 02/22/20 0518 02/22/20 0542 02/22/20 0747  BP: (!) 165/79 (!) 142/91  (!) 177/86  Pulse: 93 (!) 102 96 (!) 106  Resp: _0 Temp:    98.9 F (37.2 C)  TempSrc:    Axillary  SpO2: 97% 100% 99% 100%  Weight:      Height:        Intake/Output Summary (Last 24 hours) at 02/22/2020 0823 Last data filed at 02/22/2020 8850 Gross per 24 hour  Intake --  Output 850 ml  Net -850 ml   Filed Weights   02/18/20 0349 02/19/20 0500 02/20/20 0425  Weight: 68.3 kg 66.8 kg  65.1 kg     Data Reviewed:   CBC: Recent Labs  Lab 02/18/20 1831 02/20/20 0334 02/22/20 0402  WBC 7.4 7.4 3.7*  NEUTROABS  --  5.9  --   HGB 10.0* 10.4* 9.7*  HCT 31.6* 33.6* 31.8*  MCV 82.5 83.8 84.8  PLT 289 275 277   Basic Metabolic Panel: Recent Labs  Lab 02/20/20 0334 02/22/20 0402  NA 140 144  K 3.3* 3.5  CL 104 108  CO2 22 20*  GLUCOSE 99 93  BUN 9 8  CREATININE 0.66 0.68  CALCIUM 8.6* 8.5*  MG  --  2.3   GFR: Estimated Creatinine Clearance: 74.9 mL/min (by C-G formula based on SCr of 0.68 mg/dL). Liver Function Tests: Recent Labs  Lab 02/20/20 0334  AST 26  ALT 39  ALKPHOS 76  BILITOT 1.5*  PROT 6.1*  ALBUMIN 2.6*   No results for input(s): LIPASE, AMYLASE in the last 168 hours. No results for input(s): AMMONIA in the last 168 hours. Coagulation Profile: Recent Labs  Lab 02/22/20 0446  INR 1.1   Cardiac Enzymes: No results for input(s): CKTOTAL, CKMB, CKMBINDEX, TROPONINI in the last 168 hours. BNP (last 3 results) No results for input(s): PROBNP in the last 8760 hours. HbA1C: No results for input(s): HGBA1C in the last 72 hours. CBG: Recent Labs  Lab 02/21/20 1624 02/21/20 1958 02/21/20 2334 02/22/20 0342 02/22/20 0742  GLUCAP 79 81 90 86 86   Lipid Profile: No results for input(s): CHOL, HDL, LDLCALC, TRIG, CHOLHDL, LDLDIRECT in the last 72 hours. Thyroid Function Tests: No results for input(s): TSH, T4TOTAL, FREET4, T3FREE, THYROIDAB in the last 72 hours. Anemia Panel: No results for input(s): VITAMINB12, FOLATE, FERRITIN, TIBC, IRON, RETICCTPCT in the last 72 hours. Sepsis Labs: No results for input(s): PROCALCITON, LATICACIDVEN in the last 168 hours.  Recent Results (from the past 240 hour(s))  Culture, respiratory (non-expectorated)     Status: None   Collection Time: 02/16/20  4:02 PM   Specimen: Tracheal Aspirate; Respiratory  Result Value Ref Range Status   Specimen Description TRACHEAL ASPIRATE  Final   Special  Requests NONE  Final   Gram Stain   Final    ABUNDANT WBC PRESENT, PREDOMINANTLY PMN ABUNDANT GRAM NEGATIVE RODS MODERATE GRAM POSITIVE COCCI IN PAIRS FEW GRAM POSITIVE RODS    Culture   Final    Consistent with normal respiratory flora. No Pseudomonas species isolated Performed at [facility] [address], [place], [place] [address]    Report Status 02/18/2020 FINAL  Final  Aerobic Culture (superficial specimen)     Status: None   Collection Time: 02/18/20  9:43 PM   Specimen: Wound  Result Value Ref Range Status   Specimen Description WOUND NECK  Final   Special Requests NONE  Final   Gram Stain   Final    MODERATE WBC PRESENT,BOTH PMN AND MONONUCLEAR ABUNDANT GRAM POSITIVE COCCI ABUNDANT GRAM NEGATIVE RODS    Culture   Final    FEW MULTIPLE ORGANISMS PRESENT, NONE PREDOMINANT NO STAPHYLOCOCCUS AUREUS ISOLATED NO GROUP A STREP (S.PYOGENES) ISOLATED Performed at [facility],  [address], [place], [place] [address]    Report Status 02/20/2020 FINAL  Final         Radiology Studies: No results found.      Scheduled Meds: . amLODipine  10 mg Per Tube Daily  . chlorhexidine gluconate (MEDLINE KIT)  15 mL Mouth Rinse BID  . Chlorhexidine Gluconate Cloth  6 each Topical Daily  . docusate  100 mg Per Tube BID  . feeding supplement (PROSource TF)  45 mL Per Tube BID  . free water  300 mL Per Tube Q4H  . heparin  5,000 Units Subcutaneous Q8H  . hydrALAZINE  25 mg Per Tube Q8H  . insulin aspart  0-20 Units Subcutaneous Q4H  . labetalol  300 mg Per Tube Q8H  . levothyroxine  100 mcg Per Tube Q0600  . losartan  100 mg Per Tube Daily  . mouth rinse  15 mL Mouth Rinse 10 times per day  . metoCLOPramide  5 mg Per Tube Q6H  . pantoprazole sodium  40 mg Per Tube Daily  . polyethylene glycol  17 g Per Tube Daily  . polyvinyl alcohol  1 drop Left Eye BID  . sodium chloride flush  10-40 mL Intracatheter Q12H   Continuous Infusions: . 0.9 % NaCl with  KCl 20 mEq / L 100 mL/hr at 02/22/20 0538  . ceFEPime (MAXIPIME) IV 2 g (02/21/20 2204)  . dextrose 5 % and 0.45% NaCl    . feeding supplement (OSMOLITE 1.2 CAL) Stopped (02/16/20 0955)     LOS: 23 days   Time spent= 20 mins     Arsenio Loader, MD [facility]  If 7PM-7AM, please contact night-coverage  02/22/2020, 8:23 AM

## 2020-02-22 NOTE — Progress Notes (Signed)
NEUROSURGERY PROGRESS NOTE  Doing well. MAE really well. No acute changes overnight. Awaiting CIR placement. Ok to be discharged when approved.  Temp:  [98 F (36.7 C)-98.9 F (37.2 C)] 98.9 F (37.2 C) (11/26 0747) Pulse Rate:  [93-121] 106 (11/26 0747) Resp:  [14-23] 20 (11/26 0747) BP: (142-181)/(79-93) 177/86 (11/26 0747) SpO2:  [97 %-100 %] 100 % (11/26 0747) FiO2 (%):  [21 %] 21 % (11/26 0509)    Eleonore Chiquito, NP 02/22/2020 8:53 AM

## 2020-02-22 NOTE — Progress Notes (Signed)
Physical Therapy Treatment Patient Details Name: Stacy Moran MRN: [id number] DOB: 1955-10-29 Today's Date: 02/22/2020    History of Present Illness Pt is a 64 y/o female with no known PHX admitted with sudden onset of HA, BP 262/123, Imaging showing large volume SAHdue to a ruptured L PICA aneurysm associated with intraventricular hydrocephalus.  11/4, pt s/p L far-lateral craniotomy for clipping of PICA aneurysm. 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    PT Comments    Pt is making significant progress steadily towards her goals. Treated pt in conjunction with OT this date to maximize safety and quality of session with mobility. Pt was able to progress towards ambulating up to ~5 ft between taking steps anteriorly, posteriorly, and laterally to L with use of a RW. However, she displays difficulty sequencing steps and following commands this date and required extensive verbal and tactile cues along with mod-maxAx2 to maintain her balance and safety with all standing tasks. She displays decreased L leg muscular strength through decreased L foot clearance and advancement with swing phase and L knee buckling with stance phase. She was able to come to stand with only minAx2 though today. Will continue to follow acutely. Current recommendations remain appropriate.   Follow Up Recommendations  CIR;Supervision/Assistance - 24 hour     Equipment Recommendations  Wheelchair (measurements PT);Wheelchair cushion (measurements PT)    Recommendations for Other Services       Precautions / Restrictions Precautions Precautions: Fall;Other (comment) Precaution Comments: trach, wrist restraints Restrictions Weight Bearing Restrictions: No    Mobility  Bed Mobility Overal bed mobility: Needs Assistance Bed Mobility: Supine to Sit;Sit to Supine     Supine to sit: Min assist;+2 for physical assistance;+2 for safety/equipment;HOB  elevated Sit to supine: Mod assist;+2 for physical assistance;+2 for safety/equipment   General bed mobility comments: Extra time and cues provided to manage legs off EOB with minAx2 to manage legs fully off EOB and to ascend trunk. Cues provided to push up on L arm to ascend trunk, with initiation noted at end of ascent. To return to supine, pt required extra time and modAx2 to manage trunk and bring legs superior onto bed, with min initiation at legs noted.  Transfers Overall transfer level: Needs assistance Equipment used: Rolling walker (2 wheeled) Transfers: Sit to/from Stand Sit to Stand: Min assist;+2 physical assistance;+2 safety/equipment         General transfer comment: MinAx2 with cues at L quads to extend knees and at pelvis to bring hips anteriorly to come to stand. Tactile and verbal cues provided for hand placement.   Ambulation/Gait Ambulation/Gait assistance: Max assist;+2 physical assistance;+2 safety/equipment Gait Distance (Feet): 5 Feet (x2 bouts (1x rocking anteriorly<>posteriorly 38ft distance)) Assistive device: Rolling walker (2 wheeled) Gait Pattern/deviations: Step-to pattern;Decreased stride length;Decreased step length - left;Decreased stance time - left;Leaning posteriorly;Trunk flexed Gait velocity: decreased Gait velocity interpretation: <1.31 ft/sec, indicative of household ambulator General Gait Details: During 1st gait training bout cued pt to shift weight between L and R foot with R foot anterior and lateral to L, mod-maxAx2 to direct pt and to maintain balance with intermittent L knee block due to knee buckling. During second bout pt tooke several steps anteriorly then posteriorly and then laterally to L with mod-maxAx2 to maintain balance and cue pt to sequence steps. Tactile cues provided at hips to shift weight, L quads to extend knee in stance, and L hamstring to advance leg during swing.   Stairs  Wheelchair Mobility     Modified Rankin (Stroke Patients Only) Modified Rankin (Stroke Patients Only) Pre-Morbid Rankin Score: No symptoms Modified Rankin: Moderately severe disability     Balance Overall balance assessment: Needs assistance Sitting-balance support: Feet supported;Bilateral upper extremity supported;Single extremity supported Sitting balance-Leahy Scale: Poor Sitting balance - Comments: Static sitting EOB with cues to lean anteriorly, progressing from modA to minA-min guard assist to maintain balance. Postural control: Posterior lean Standing balance support: Bilateral upper extremity supported Standing balance-Leahy Scale: Poor Standing balance comment: L knee block and mod-maxAx2 to maintain balance with standing functional mobility with UEs on RW.                            Cognition Arousal/Alertness: Awake/alert Behavior During Therapy: Flat affect;Impulsive Overall Cognitive Status: Impaired/Different from baseline Area of Impairment: Attention;Following commands;Safety/judgement;Problem solving;Memory;Awareness                   Current Attention Level: Selective Memory: Decreased recall of precautions;Decreased short-term memory Following Commands: Follows one step commands inconsistently;Follows one step commands with increased time Safety/Judgement: Decreased awareness of safety;Decreased awareness of deficits Awareness: Intellectual Problem Solving: Slow processing;Decreased initiation;Difficulty sequencing;Requires verbal cues;Requires tactile cues General Comments: Pt impulsive with movements at times. Required continual monitoring to ensure pt did not pull at trach or leads, with success. Required increased time and repeated cues to have pt follow commands, with success ~30% of time. Increased time required to process and initiate tasks with max cues.       Exercises      General Comments General comments (skin integrity, edema, etc.): 5L 28% FiO2;  SpO2 >/= 98% during session; HR rose up to 123 bpm with activity      Pertinent Vitals/Pain Pain Assessment: No/denies pain Pain Intervention(s): Limited activity within patient's tolerance;Monitored during session;Repositioned    Home Living                      Prior Function            PT Goals (current goals can now be found in the care plan section) Acute Rehab PT Goals Patient Stated Goal: not stated PT Goal Formulation: With patient Time For Goal Achievement: 02/29/20 Potential to Achieve Goals: Fair Progress towards PT goals: Progressing toward goals    Frequency    Min 4X/week      PT Plan Current plan remains appropriate    Co-evaluation PT/OT/SLP Co-Evaluation/Treatment: Yes Reason for Co-Treatment: Necessary to address cognition/behavior during functional activity;For patient/therapist safety;To address functional/ADL transfers          AM-PAC PT "6 Clicks" Mobility   Outcome Measure  Help needed turning from your back to your side while in a flat bed without using bedrails?: A Little Help needed moving from lying on your back to sitting on the side of a flat bed without using bedrails?: A Lot Help needed moving to and from a bed to a chair (including a wheelchair)?: A Lot Help needed standing up from a chair using your arms (e.g., wheelchair or bedside chair)?: A Lot Help needed to walk in hospital room?: A Lot Help needed climbing 3-5 steps with a railing? : Total 6 Click Score: 12    End of Session Equipment Utilized During Treatment: Gait belt;Oxygen Activity Tolerance: Patient tolerated treatment well Patient left: in bed;with call bell/phone within reach;with bed alarm set;with nursing/sitter in room;with restraints reapplied Nurse Communication: Mobility status  PT Visit Diagnosis: Hemiplegia and hemiparesis;Other symptoms and signs involving the nervous system (R29.898);Other abnormalities of gait and mobility (R26.89);Unsteadiness on  feet (R26.81);Muscle weakness (generalized) (M62.81);Difficulty in walking, not elsewhere classified (R26.2) Hemiplegia - Right/Left: Left Hemiplegia - dominant/non-dominant: Non-dominant Hemiplegia - caused by: Other Nontraumatic intracranial hemorrhage     Time: 3358-2518 PT Time Calculation (min) (ACUTE ONLY): 25 min  Charges:  $Gait Training: 8-22 mins                     Moishe Spice, PT, DPT Acute Rehabilitation Services  Pager: [phone] Office: [place] 02/22/2020, 9:48 AM

## 2020-02-22 NOTE — Progress Notes (Signed)
Paged dr Tonie Griffith to renew restraint order. He is agreeable to renew.

## 2020-02-22 NOTE — Progress Notes (Signed)
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  11/4, pt s/p L far-lateral craniotomy for clipping of PICA aneurysm. 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   OT comments  Pt noted to have copious amount of secretions around dressing of trach and RN called to room to address. OT cleaning trach collar mask. Pt with new gown applied during session max (A) as pt continued the task after arm don causing doff of gown. Pt perseverating the task. Pt total +2 min (A) for sit<>Stand this session with RW. Pt able to sustain hand grasp on RW with cues to visually attend to hand at times during session. Pt positioned in chair position due to pending IR procedure for transfers. Sitter in room at this time.    Follow Up Recommendations  CIR    Equipment Recommendations  Wheelchair (measurements OT);Wheelchair cushion (measurements OT);Hospital bed    Recommendations for Other Services Rehab consult    Precautions / Restrictions Precautions Precautions: Fall Precaution Comments: trach, wrist restraints Restrictions Weight Bearing Restrictions: No       Mobility Bed Mobility Overal bed mobility: Needs Assistance Bed Mobility: Supine to Sit;Sit to Supine Rolling: Min guard   Supine to sit: Min assist;+2 for physical assistance;+2 for safety/equipment;HOB elevated Sit to supine: Mod assist;+2 for physical assistance;+2 for safety/equipment   General bed mobility comments: Extra time and cues provided to manage legs off EOB with minAx2 to manage legs fully off EOB and to ascend trunk. Cues provided to push up on L arm to ascend trunk,  with initiation noted at end of ascent. To return to supine, pt required extra time and modAx2 to manage trunk and bring legs superior onto bed, with min initiation at legs noted.  Transfers Overall transfer level: Needs assistance Equipment used: Rolling walker (2 wheeled) Transfers: Sit to/from Stand Sit to Stand: Min assist;+2 physical assistance;+2 safety/equipment         General transfer comment: MinAx2 with cues at L quads to extend knees and at pelvis to bring hips anteriorly to come to stand. Tactile and verbal cues provided for hand placement.     Balance Overall balance assessment: Needs assistance Sitting-balance support: Feet supported;Bilateral upper extremity supported;Single extremity supported Sitting balance-Leahy Scale: Poor Sitting balance - Comments: Static sitting EOB with cues to lean anteriorly, progressing from modA to minA-min guard assist to maintain balance. Postural control: Posterior lean Standing balance support: Bilateral upper extremity supported Standing balance-Leahy Scale: Poor Standing balance comment: L knee block and mod-maxAx2 to maintain balance with standing functional mobility with UEs on RW.                           ADL either performed or assessed with clinical judgement   ADL Overall ADL's : Needs assistance/impaired Eating/Feeding: NPO   Grooming: Maximal assistance               Lower Body Dressing: Total assistance   Toilet Transfer: +2 for physical assistance;Maximal assistance Toilet Transfer Details (indicate cue type and reason): simulated eob movement         Functional mobility during ADLs: +2 for physical assistance;Maximal assistance;+2 for safety/equipment;Rolling  walker General ADL Comments: pt progressed from supine to sitting eob then standing. pt with weight shifting activity to help progress transfers     Vision       Perception     Praxis      Cognition Arousal/Alertness:  Awake/alert Behavior During Therapy: Flat affect;Impulsive Overall Cognitive Status: Impaired/Different from baseline Area of Impairment: Attention;Following commands;Safety/judgement;Problem solving;Memory;Awareness                 Orientation Level: Disoriented to;Time;Situation Current Attention Level: Selective Memory: Decreased recall of precautions;Decreased short-term memory Following Commands: Follows one step commands inconsistently;Follows one step commands with increased time Safety/Judgement: Decreased awareness of safety;Decreased awareness of deficits Awareness: Intellectual Problem Solving: Slow processing;Decreased initiation;Difficulty sequencing;Requires verbal cues;Requires tactile cues General Comments: pt scratching throughout session and needs redirection to avoid lines/ leads. pt impulsive with movements and needs cues for safety. pt following 1 step commands with increased time ~30 % of time.          Exercises     Shoulder Instructions       General Comments 5L% 28% FIo2 HR  max 123 during session - noted to have dark odor secretions from trach that are brownish in color cue. pt producing more secretions with sitting    Pertinent Vitals/ Pain       Pain Assessment: No/denies pain Pain Intervention(s): Limited activity within patient's tolerance;Monitored during session;Repositioned  Home Living                                          Prior Functioning/Environment              Frequency  Min 2X/week        Progress Toward Goals  OT Goals(current goals can now be found in the care plan section)  Progress towards OT goals: Progressing toward goals  Acute Rehab OT Goals Patient Stated Goal: not stated- non verbal OT Goal Formulation: With patient Time For Goal Achievement: 03/07/20 Potential to Achieve Goals: Good ADL Goals Pt Will Perform Grooming: with min assist;sitting Pt Will Perform Upper Body Bathing: with  min assist;sitting Pt/caregiver will Perform Home Exercise Program: Increased strength;Increased ROM;Both right and left upper extremity;With minimal assist;With written HEP provided Additional ADL Goal #1: Pt will follow multi-step commands with 75% accuracy and no more than min cues. Additional ADL Goal #2: Pt will perform bed mobility with modA as precursor to EOB/OOB ADL. Additional ADL Goal #3: Pt will maintain static balacne EOB >5 min with minA as precursor to ADL.  Plan Discharge plan remains appropriate;Frequency remains appropriate    Co-evaluation    PT/OT/SLP Co-Evaluation/Treatment: Yes Reason for Co-Treatment: Necessary to address cognition/behavior during functional activity;For patient/therapist safety;To address functional/ADL transfers   OT goals addressed during session: ADL's and self-care;Proper use of Adaptive equipment and DME;Strengthening/ROM      AM-PAC OT "6 Clicks" Daily Activity     Outcome Measure   Help from another person eating meals?: Total Help from another person taking care of personal grooming?: A Lot Help from another person toileting, which includes using toliet, bedpan, or urinal?: Total Help from another person bathing (including washing, rinsing, drying)?: Total Help from another person to put on and taking off regular upper body clothing?: Total Help from another person to put on and taking off regular lower body clothing?: Total 6 Click Score: 7    End  of Session Equipment Utilized During Treatment: Rolling walker;Oxygen  OT Visit Diagnosis: Unsteadiness on feet (R26.81);Muscle weakness (generalized) (M62.81);Other abnormalities of gait and mobility (R26.89) Hemiplegia - Right/Left: Left Hemiplegia - dominant/non-dominant: Non-Dominant Hemiplegia - caused by: Nontraumatic intracerebral hemorrhage   Activity Tolerance Patient tolerated treatment well   Patient Left in bed;with call bell/phone within reach;with bed alarm set;Other  (comment);with nursing/sitter in room;with restraints reapplied (sitter, placed in chair position due to pending IR procedure)   Nurse Communication Mobility status;Precautions        Time: 5732-2567 OT Time Calculation (min): 45 min  Charges: OT General Charges $OT Visit: 1 Visit OT Treatments $Self Care/Home Management : 8-22 mins   Brynn, OTR/L  Acute Rehabilitation Services Pager: [phone] Office: [phone] .'   Jeri Modena 02/22/2020, 10:11 AM

## 2020-02-23 ENCOUNTER — Inpatient Hospital Stay (HOSPITAL_COMMUNITY): Payer: Self-pay

## 2020-02-23 LAB — CBC
HCT: 33.5 % — ABNORMAL LOW (ref 36.0–46.0)
Hemoglobin: 10.2 g/dL — ABNORMAL LOW (ref 12.0–15.0)
MCH: 25.8 pg — ABNORMAL LOW (ref 26.0–34.0)
MCHC: 30.4 g/dL (ref 30.0–36.0)
MCV: 84.6 fL (ref 80.0–100.0)
Platelets: 292 10*3/uL (ref 150–400)
RBC: 3.96 MIL/uL (ref 3.87–5.11)
RDW: 18 % — ABNORMAL HIGH (ref 11.5–15.5)
WBC: 5 10*3/uL (ref 4.0–10.5)
nRBC: 0 % (ref 0.0–0.2)

## 2020-02-23 LAB — BASIC METABOLIC PANEL
Anion gap: 12 (ref 5–15)
BUN: <5 mg/dL — ABNORMAL LOW (ref 8–23)
CO2: 24 mmol/L (ref 22–32)
Calcium: 8.5 mg/dL — ABNORMAL LOW (ref 8.9–10.3)
Chloride: 106 mmol/L (ref 98–111)
Creatinine, Ser: 0.62 mg/dL (ref 0.44–1.00)
GFR, Estimated: >60 mL/min (ref >60)
Glucose, Bld: 157 mg/dL — ABNORMAL HIGH (ref 70–99)
Potassium: 3.1 mmol/L — ABNORMAL LOW (ref 3.5–5.1)
Sodium: 142 mmol/L (ref 135–145)

## 2020-02-23 LAB — GLUCOSE, CAPILLARY
Glucose-Capillary: 100 mg/dL — ABNORMAL HIGH (ref 70–99)
Glucose-Capillary: 118 mg/dL — ABNORMAL HIGH (ref 70–99)
Glucose-Capillary: 126 mg/dL — ABNORMAL HIGH (ref 70–99)
Glucose-Capillary: 128 mg/dL — ABNORMAL HIGH (ref 70–99)
Glucose-Capillary: 151 mg/dL — ABNORMAL HIGH (ref 70–99)
Glucose-Capillary: 154 mg/dL — ABNORMAL HIGH (ref 70–99)

## 2020-02-23 LAB — TROPONIN I (HIGH SENSITIVITY): Troponin I (High Sensitivity): 70 ng/L — ABNORMAL HIGH (ref <18)

## 2020-02-23 LAB — MAGNESIUM: Magnesium: 2.1 mg/dL (ref 1.7–2.4)

## 2020-02-23 IMAGING — CT CT HEAD W/O CM
3 series · 14 of 47 positions shown, 16 images · non-contrast
Comparison: CT head [DATE], CT neck [DATE].
COMPARISON: CT head [DATE], CT neck [DATE].

Addendum:
CLINICAL DATA: Mental status change unknown. The period of
unresponsiveness.

EXAM:
CT HEAD WITHOUT CONTRAST
TECHNIQUE: Contiguous axial images were obtained from the base of the skull
through the vertex without intravenous contrast.

[Series 3: head 5.0 h30s · axial · 0.46mm/px · z∈[+1040,+1195]mm · 8 of 37 slices shown, 10 images]
[im 3/37  brain]
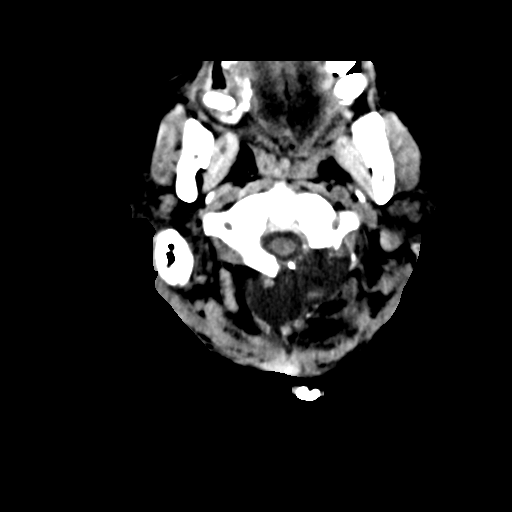
[im 3/37  bone]
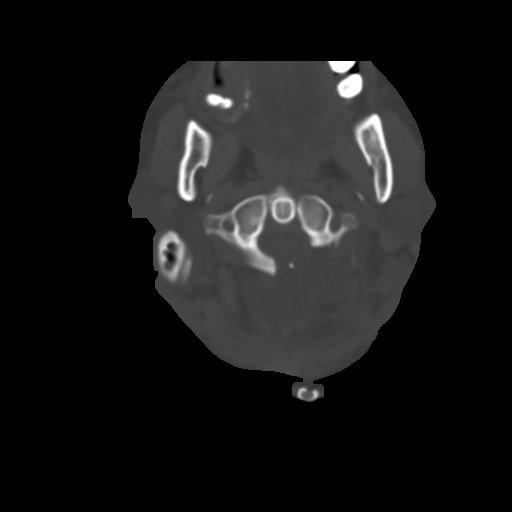
[im 8/37  brain]
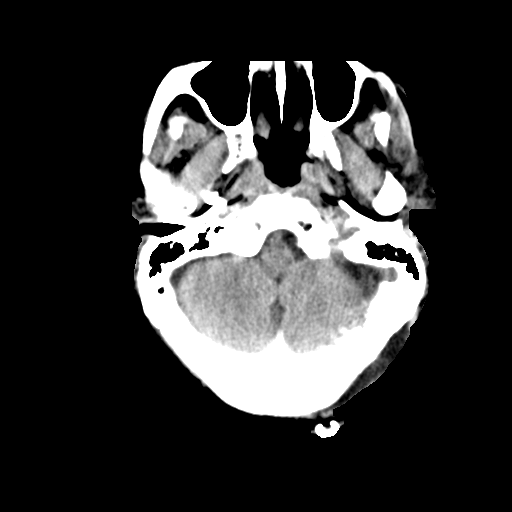
[im 12/37  brain]
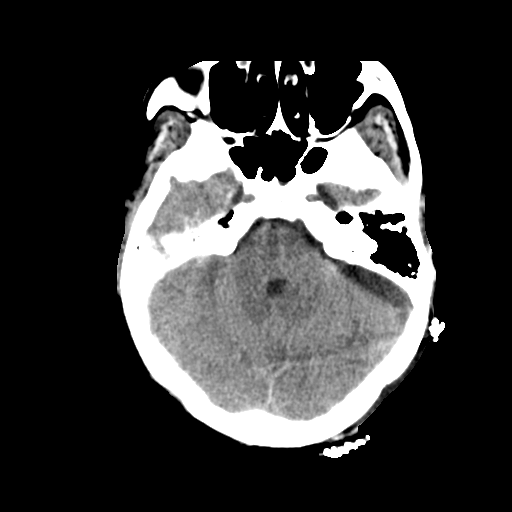
[im 17/37  brain]
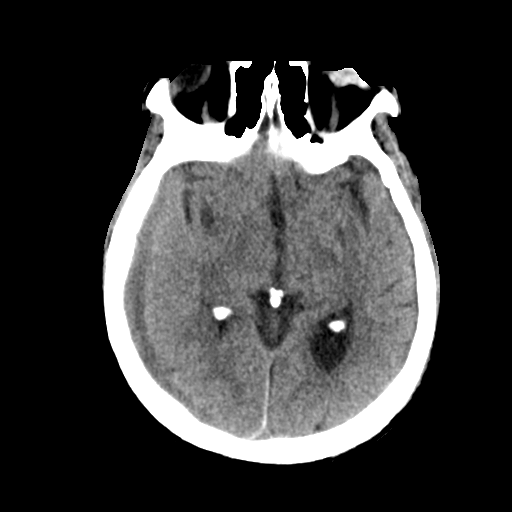
[im 20/37  brain]
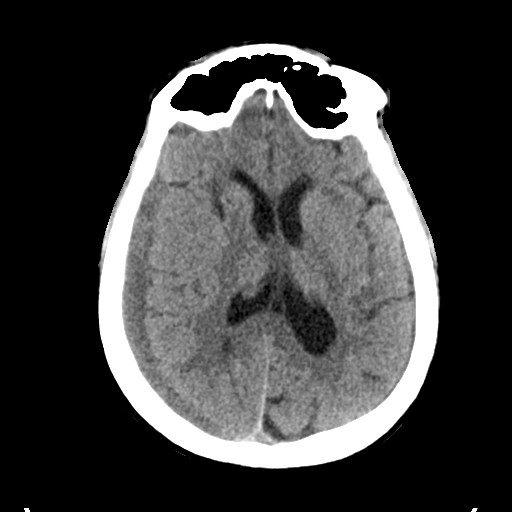
[im 20/37  bone]
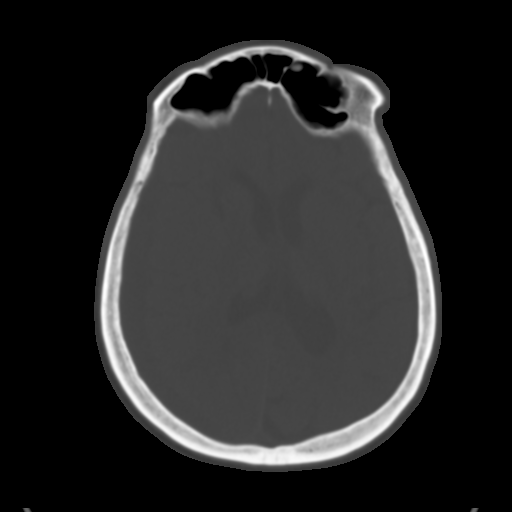
[im 25/37  brain]
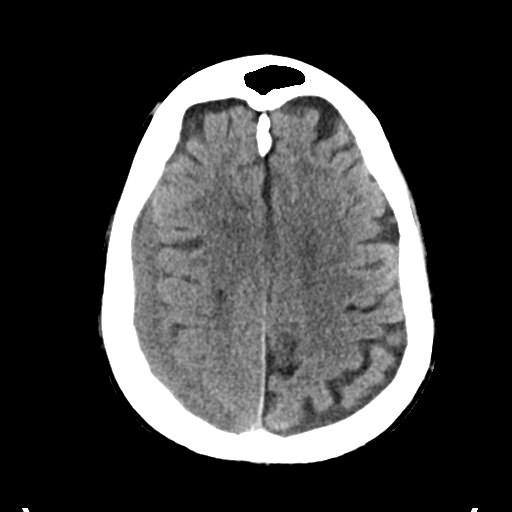
[im 29/37  brain]
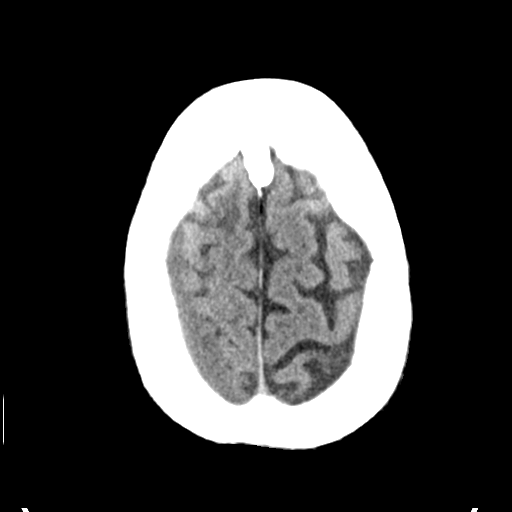
[im 34/37  brain]
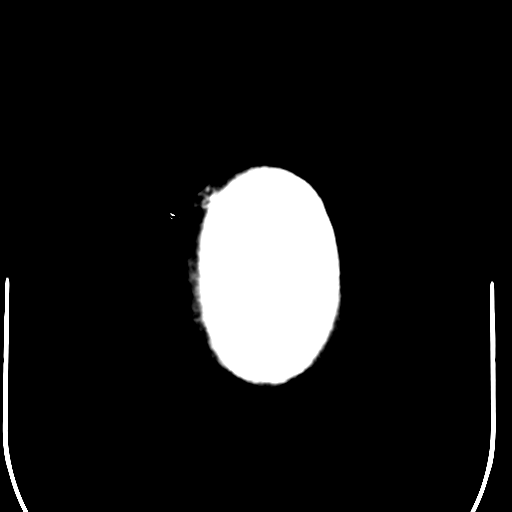

[Series 5: head 3.0 mpr cor · coronal · 0.35mm/px · 3 of 74 slices shown]
[im 25/74  brain]
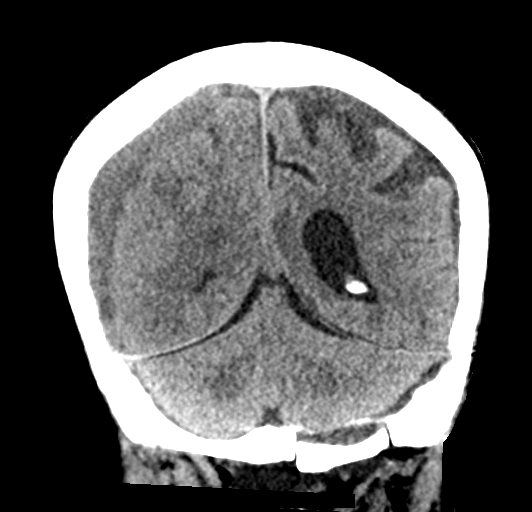
[im 33/74  brain]
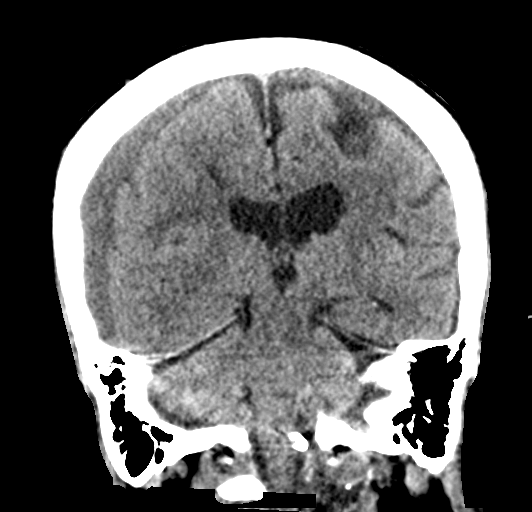
[im 41/74  brain]
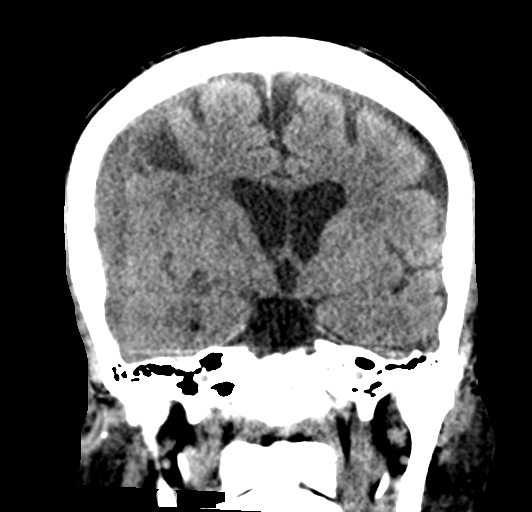

[Series 6: head 3.0 mpr sag · sagittal · 0.35mm/px · 3 of 65 slices shown]
[im 22/65  brain]
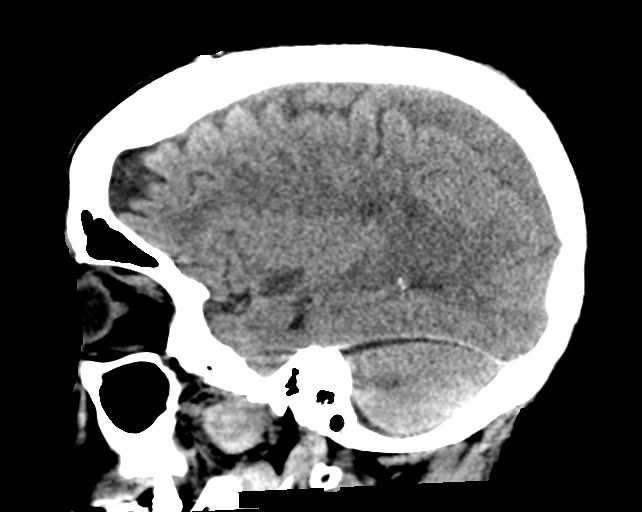
[im 33/65  brain]
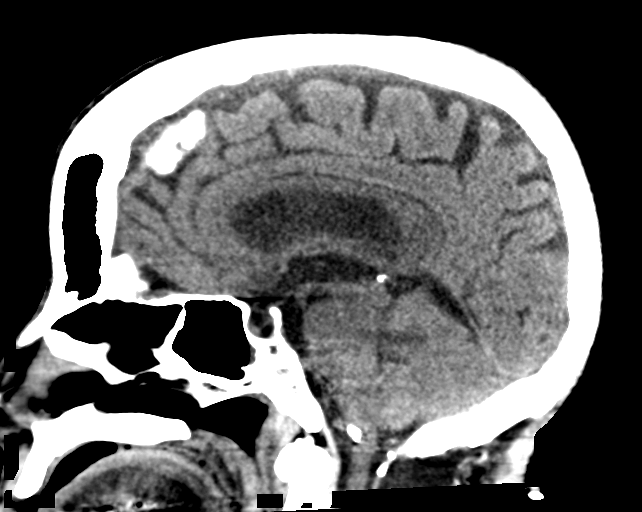
[im 43/65  brain]
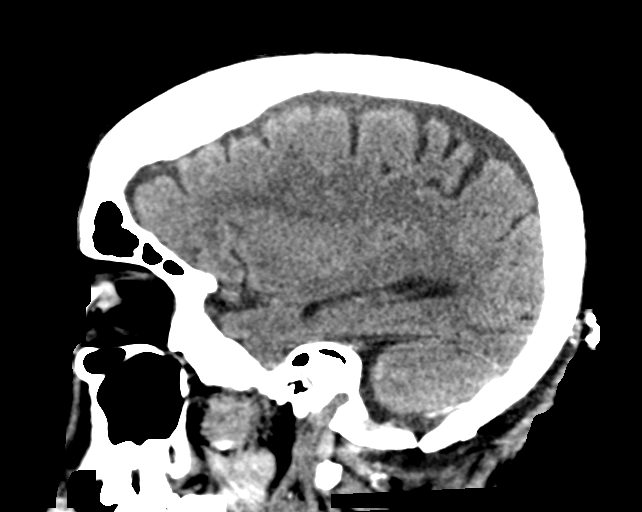

[14 of 47 positions shown; findings below may reference images not displayed]

FINDINGS: Brain:

Patchy and confluent areas of decreased attenuation are noted
throughout the deep and periventricular white matter of the cerebral
hemispheres bilaterally, compatible with chronic microvascular
ischemic disease.

No evidence of large-territorial acute infarction. No parenchymal
hemorrhage. No mass lesion.

Redemonstration of a right calvarial convexity fluid collection now
appearing homogeneous and slightly more hypodense compared to CT
head [DATE] (hypodense compared to the gray cortex but higher
density than cerebrospinal fluid). This extra fluid collection
corresponds to a subacute to chronic right subdural hematoma and
measures stable in size up to 1.2 cm (from an acute subdural
hemorrhage measuring up to 7 mm on [DATE]). Suggestion of a
similar-appearing subacute acute left frontal extra-axial fluid
collection measuring up to 4 mm. Interval resolution of previously
noted trace subarachnoid hemorrhage. The

Similar-appearing partial effacement of the right cerebrum sulci.
Stable 3 mm right to left midline shift. The basilar cisterns are
patent. No hydrocephalus. No intraventricular hemorrhage.

Vascular: No hyperdense vessel. Aneurysmal clip noted along the
foramen magnum.

Skull: Prior left cerebellar calvarial craniotomy with suture
fasteners. No acute fracture or focal lesion.

Sinuses/Orbits: Paranasal sinuses and mastoid air cells are clear.
The orbits are unremarkable.

Other: Skin staples overlie the left posterior scalp.
IMPRESSION: 1. Stable now subacute to chronic 1.2 cm right subdural hematoma
with stable 3 mm right to left midline shift.
2. Interval resolution of previously noted trace subarachnoid
hemorrhage.
3. Suggestion of a subacute to chronic stable left frontal 4 mm
extra-axial fluid collection.

ADDENDUM:
Upon further evaluation, no definite left frontal 4 mm extra-axial
fluid collection.

*** End of Addendum ***
FINDINGS: Brain:

Patchy and confluent areas of decreased attenuation are noted
throughout the deep and periventricular white matter of the cerebral
hemispheres bilaterally, compatible with chronic microvascular
ischemic disease.

No evidence of large-territorial acute infarction. No parenchymal
hemorrhage. No mass lesion.

Redemonstration of a right calvarial convexity fluid collection now
appearing homogeneous and slightly more hypodense compared to CT
head [DATE] (hypodense compared to the gray cortex but higher
density than cerebrospinal fluid). This extra fluid collection
corresponds to a subacute to chronic right subdural hematoma and
measures stable in size up to 1.2 cm (from an acute subdural
hemorrhage measuring up to 7 mm on [DATE]). Suggestion of a
similar-appearing subacute acute left frontal extra-axial fluid
collection measuring up to 4 mm. Interval resolution of previously
noted trace subarachnoid hemorrhage. The

Similar-appearing partial effacement of the right cerebrum sulci.
Stable 3 mm right to left midline shift. The basilar cisterns are
patent. No hydrocephalus. No intraventricular hemorrhage.

Vascular: No hyperdense vessel. Aneurysmal clip noted along the
foramen magnum.

Skull: Prior left cerebellar calvarial craniotomy with suture
fasteners. No acute fracture or focal lesion.

Sinuses/Orbits: Paranasal sinuses and mastoid air cells are clear.
The orbits are unremarkable.

Other: Skin staples overlie the left posterior scalp.
IMPRESSION: 1. Stable now subacute to chronic 1.2 cm right subdural hematoma
with stable 3 mm right to left midline shift.
2. Interval resolution of previously noted trace subarachnoid
hemorrhage.
3. Suggestion of a subacute to chronic stable left frontal 4 mm
extra-axial fluid collection.

## 2020-02-23 MED ORDER — POTASSIUM CHLORIDE 10 MEQ/100ML IV SOLN
10.0000 meq | INTRAVENOUS | Status: DC
  Filled 2020-02-23: qty 100

## 2020-02-23 MED ORDER — POTASSIUM CHLORIDE 20 MEQ/15ML (10%) PO SOLN
40.0000 meq | Freq: Once | ORAL | Status: AC
  Administered 2020-02-23: 40 meq via ORAL
  Filled 2020-02-23: qty 30

## 2020-02-23 MED ORDER — LACTATED RINGERS IV BOLUS
1000.0000 mL | Freq: Once | INTRAVENOUS | Status: AC
  Administered 2020-02-23: 1000 mL via INTRAVENOUS

## 2020-02-23 MED ORDER — SCOPOLAMINE 1 MG/3DAYS TD PT72
1.0000 | MEDICATED_PATCH | TRANSDERMAL | Status: AC
  Administered 2020-02-23 – 2020-02-26 (×2): 1.5 mg via TRANSDERMAL
  Filled 2020-02-23 (×2): qty 1

## 2020-02-23 NOTE — Progress Notes (Addendum)
At 1950: Patient was unresponsive when RN was assessing a patient. Patient was cognitive when RN was getting handoff report around 73. Patient was asking sitting position on the bed. She asked for a blanket so she got blanket before 1950.  She was not responding with hard sternal rubbing. BP was dropped from 151/96 (map: 112) at 1820 to 88/55 (64).   Rapid called. Patient was placed to flat position. CBG was 126. SBP has been 90s. She has been slowly waking up. However, she looks drowsy. She has been following commands. Dr. Tonie Griffith notified. New orders received.   Late entry, at 1900: pt was not on restraints at this time. Also, PIV on right arm was infiltrated and +2 edema noted. Elevated her right arm at this time.

## 2020-02-23 NOTE — Consult Note (Signed)
[place] Nurse wound consult note Consultation was completed by review of records, images and assistance from the bedside nurse/clinical staff.   Reason for Consult: trach drainage and bleeding Wound type: full thickness Pressure Injury POA: NA  Dressing procedure/placement/frequency: updated orders for drawtex (super absorbant) dressings for trach site.   Provided Kellie Simmering numbers ,Discussed POC with patient and bedside nurse.  Re consult if needed, will not follow at this time. Thanks  Dastan Krider [employer], RN,CWOCN, CNS, [place] [phone])

## 2020-02-23 NOTE — Significant Event (Signed)
Rapid Response Event Note   Reason for Call :  Unresponsiveness  Initial Focused Assessment:  On my arrival, pt laying in bed with eyes open, only moving R side. R pupil 3 and brisk, L pupil is a prosthetic. Skin warm and dry. After a few minutes, pt able to move all extremities, follow commands, and mouth words appropriately. Per staff, she is lethargic but back to her baseline.  T-97.6, HR-80, BP-90/59, RR-16, SpO2-97%.   SBP have been running 130-180s. At the time pt went unresponsive, her SBP-80s.  Pt is on Norvasc, Hydralazine(q8h per tube), Labetalol(q8h per tube), and Cozaar.    Interventions:  CBG-126 IV Team-new PIV placed 1L LR bolus Trop STAT CT head Plan of Care:  On return from CT, pt no longer lethargic and back to baseline per 3W staff, BP-111/62. Finish LR bolus. Await CT and trop results and notifiy MD if abnormalities. Continue to monitor pt. Call RRT if further assistance needed.    Event Summary:   MD Notified: Dr. Tonie Griffith notified by 3W RN Call Cool End Time:2140  Dillard Essex, RN

## 2020-02-23 NOTE — Progress Notes (Signed)
Looking better today.  She is awake and regards me and smiles at me.  No acute distress.  Continue current management.

## 2020-02-23 NOTE — Progress Notes (Signed)
Referring Physician(s): Dr. Silas Flood  Supervising Physician: Dr. Serafina Royals  Patient Status:  [facility] - In-pt  Chief Complaint: Dysphagia  Subjective: Alert and cognizant this AM.  In restraints, but gestures her approval for me to examine her abdomen.   Allergies: Patient has no known allergies.  Medications: Prior to Admission medications   Not on File     Vital Signs: BP (!) 169/88 (BP Location: Right Arm)   Pulse 79   Temp 98.6 F (37 C) (Oral)   Resp (!) 21   Ht 5\' 9"  (1.753 m)   Wt 143 lb 8.3 oz (65.1 kg)   SpO2 99%   BMI 21.19 kg/m   Physical Exam  NAD, alert Abdomen: soft, non-distended.  Abdominal muscles are tight.  Tube in place without issue.  Insertion site c/d/i. Ready for use.   Imaging: CT ABDOMEN WO CONTRAST  Result Date: 02/20/2020 CLINICAL DATA:  Evaluate anatomy prior to potential percutaneous gastrostomy tube placement. EXAM: CT ABDOMEN WITHOUT CONTRAST TECHNIQUE: Multidetector CT imaging of the abdomen was performed following the standard protocol without IV contrast. COMPARISON:  None. FINDINGS: The lack of intravenous contrast limits the ability to evaluate solid abdominal organs. Lower chest: Limited visualization of lower thorax demonstrates minimal dependent subpleural ground-glass atelectasis. No pleural effusion. Cardiomegaly. Trace amount of pericardial fluid, potentially physiologic. Hepatobiliary: Normal hepatic contour. Normal noncontrast appearance of the gallbladder given degree distention, though note, evaluation further degraded secondary to significant streak artifact from adjacent monitoring apparatus. No ascites. Pancreas: The pancreas appears atrophic. Spleen: Normal noncontrast appearance of the spleen. Adrenals/Urinary Tract: Normal noncontrast appearance of the bilateral kidneys. No renal stones. No urine obstruction or perinephric stranding. Mild thickening of the crux of left adrenal gland without discrete nodule. Normal  noncontrast appearance of the right adrenal gland. The urinary bladder was not imaged. Stomach/Bowel: The anterior wall of the mid/distal aspect of the stomach is well apposed against the ventral wall of the abdomen without interposition of either the hepatic parenchyma or the transverse colon and will likely be improved with gastric insufflation (representative image 24, series 3). Moderate liquid stool burden. No evidence of enteric obstruction. Ingested radiopaque debris is seen at the level of the hepatic flexure of the colon. Normal noncontrast appearance of the appendix. No pneumoperitoneum, pneumatosis or portal venous gas. Vascular/Lymphatic: Normal caliber of the abdominal aorta. No bulky retroperitoneal or mesenteric lymphadenopathy on this noncontrast examination. Other: Diffuse body wall anasarca, most conspicuous about the midline of the low back. Musculoskeletal: No acute or aggressive osseous abnormalities. IMPRESSION: 1. Gastric anatomy amenable to potential percutaneous gastrostomy tube placement as indicated. 2. Cardiomegaly. Electronically Signed   By: Sandi Mariscal M.D.   On: 02/20/2020 09:52   CT SOFT TISSUE NECK W CONTRAST  Result Date: 02/20/2020 CLINICAL DATA:  Post thyroidectomy with tracheostomy possible infection EXAM: CT NECK WITH CONTRAST TECHNIQUE: Multidetector CT imaging of the neck was performed using the standard protocol following the bolus administration of intravenous contrast. CONTRAST:  32mL OMNIPAQUE IOHEXOL 300 MG/ML  SOLN COMPARISON:  None. FINDINGS: Pharynx and larynx: Tracheostomy device present. Irregularity of the defect margins. Subglottic airway is patent. Mild laryngeal and pharyngeal wall thickening may reflect edema. Salivary glands: Unremarkable. Thyroid: Post thyroidectomy. Within the right thyroidectomy bed there is a peripherally enhancing, irregularly marginated fluid collection with foci of air likely due to communication with the tracheostomy site (series  7, image 88). Thicker hyperdense or enhancing tissue on the seen on series 7, image 85. Lymph nodes: No  enlarged nodes identified. Vascular: Major neck vessels are patent. Limited intracranial: No abnormal enhancement. Left suboccipital craniotomy. Fluid collection superficial to the craniotomy extending posterior to the C1 arch, which is also been partially resected. This was present on prior head imaging. Right cerebral convexity subdural hematoma is partially imaged and likely similar to prior head CT. Visualized orbits: Left ocular prosthesis.  Otherwise unremarkable. Mastoids and visualized paranasal sinuses: No significant opacification. Skeleton: Cervical spine degenerative changes, greatest at C4-C5 and C5-C6. Upper chest: No apical lung mass or consolidation. Other: None. IMPRESSION: Post thyroidectomy with peripherally enhancing, irregularly marginated fluid collection within the surgical bed on the right back communicates with the tracheostomy site. There is irregularity of the tracheostomy defect margins. Superimposed infection is therefore possible as suspected. Electronically Signed   By: Macy Mis M.D.   On: 02/20/2020 08:16   IR GASTROSTOMY TUBE MOD SED  Result Date: 02/22/2020 INDICATION: 64 year old female with a history of dysphagia EXAM: PERC PLACEMENT GASTROSTOMY MEDICATIONS: 2 g Ancef; Antibiotics were administered within 1 hour of the procedure. ANESTHESIA/SEDATION: Versed 2.0 mg IV; Fentanyl 100 mcg IV Moderate Sedation Time:  11 minutes The patient was continuously monitored during the procedure by the interventional radiology nurse under my direct supervision. CONTRAST:  15mL OMNIPAQUE IOHEXOL 300 MG/ML SOLN - administered into the gastric lumen. FLUOROSCOPY TIME:  Fluoroscopy Time: 2 minutes 18 seconds (42 mGy). COMPLICATIONS: None PROCEDURE: Informed written consent was obtained from the patient and the patient's family after a thorough discussion of the procedural risks,  benefits and alternatives. All questions were addressed. Maximal Sterile Barrier Technique was utilized including caps, mask, sterile gowns, sterile gloves, sterile drape, hand hygiene and skin antiseptic. A timeout was performed prior to the initiation of the procedure. The epigastrium was prepped with Betadine in a sterile fashion, and a sterile drape was applied covering the operative field. A sterile gown and sterile gloves were used for the procedure. A 5-French orogastric tube is placed under fluoroscopic guidance. Scout imaging of the abdomen confirms barium within the transverse colon. The stomach was distended with gas. Under fluoroscopic guidance, an 18 gauge needle was utilized to puncture the anterior wall of the body of the stomach. An Amplatz wire was advanced through the needle passing a T fastener into the lumen of the stomach. The T fastener was secured for gastropexy. A 9-French sheath was inserted. A snare was advanced through the 9-French sheath. A Britta Mccreedy was advanced through the orogastric tube. It was snared then pulled out the oral cavity, pulling the snare, as well. The leading edge of the gastrostomy was attached to the snare. It was then pulled down the esophagus and out the percutaneous site. Tube secured in place. Contrast was injected. Patient tolerated the procedure well and remained hemodynamically stable throughout. No complications were encountered and no significant blood loss encountered. IMPRESSION: Status post fluoroscopic placed percutaneous gastrostomy tube, with 20 [place] pull-through. Signed, Dulcy Fanny. Earleen Newport, DO [facility] Electronically Signed   By: Corrie Mckusick D.O.   On: 02/22/2020 15:52    Labs:  CBC: Recent Labs    02/18/20 1831 02/20/20 0334 02/22/20 0402 02/23/20 0300  WBC 7.4 7.4 3.7* 5.0  HGB 10.0* 10.4* 9.7* 10.2*  HCT 31.6* 33.6* 31.8* 33.5*  PLT 289 275 317 292    COAGS: Recent Labs     01/30/20 0859 02/22/20 0446  INR 1.0 1.1  APTT 27  --     BMP: Recent Labs  02/15/20 0506 02/20/20 0334 02/22/20 0402 02/23/20 0300  NA 135 140 144 142  K 3.0* 3.3* 3.5 3.1*  CL 101 104 108 106  CO2 23 22 20* 24  GLUCOSE 100* 99 93 157*  BUN 18 9 8  <5*  CALCIUM 8.3* 8.6* 8.5* 8.5*  CREATININE 0.62 0.66 0.68 0.62  GFRNONAA >60 >60 >60 >60    LIVER FUNCTION TESTS: Recent Labs    02/02/20 0424 02/08/20 0306 02/09/20 0601 02/20/20 0334  BILITOT  --  0.5 0.4 1.5*  AST  --  20 20 26   ALT  --  30 26 39  ALKPHOS  --  78 78 76  PROT  --  6.5 6.4* 6.1*  ALBUMIN 2.7* 2.5* 2.4* 2.6*    Assessment and Plan: Subarachnoid hemorrhage, now trach dependent for long-term care S/p gastrostomy tube placement yesterday by Dr. Enriqueta Shutter assessed today and found intact.  No oozing or bleeding around insertion site.  Abdominal muscles are tightly flexed, however minimal tenderness with palpation-- not unexpected after new placement.  G-tube ready for use.  RN called to room to assist with suctioning.  IR available if needed.  No current needs in IR.   Electronically Signed: Docia Barrier, PA 02/23/2020, 2:51 PM   I spent a total of 15 Minutes at the the patient's bedside AND on the patient's hospital floor or unit, greater than 50% of which was counseling/coordinating care for dysphagia.

## 2020-02-23 NOTE — Progress Notes (Signed)
PROGRESS NOTE    Stacy Moran  OZD:[id number] DOB: 1955/10/23 DOA: 01/30/2020 PCP: No primary care provider on file.   Brief Narrative:  64 y.o. 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   She had a tracheostomy and total thyroidectomy on 11/12; she has self-decanulated twice and is now in soft wrist restraints with a bedside sitter.   She has been recommended for G-tube placement.  She is awake and appears to be able to answer yes/no questions by head movements but is unable to talk.  She denies pain.  Appears to have cognitive function.Had aneurysm with clipping, prolonged intubation with enlarged thyroid.  She had thyroidectomy in order for trach to be placed.  She has been off the vent for a week and stable.  Needs routine trach care - Dr. Constance Holster is following.  Likely needs PEG - IR consult request placed.  Has sputum culture pending; on MBS she appears to have subglottal obstruction and so CT ordered and started on Cefepime.  Has developed HTN, DM while hospitalized.   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  Supportive care with concerns of infection therefore on cefepime.  Closely monitor bleeding and hemoglobin. -Scopolamine added for upper respiratory secretion at trach site   Essential hypertension, uncontrolled -Currently on Cozaar, labetalol, amlodipine.  Hydralazine added. -Suspect some  autonomic dysfunction   Prediabetes -No known medical problems PTA -A1c on 11/3 was 6.2 -Sliding scale -Levemir 12 units discontinued until her feeding has been restarted.   Dysphagia -Failed swallow evaluation -G-tube placement placed 11/26.  Plans to start tube feeds today, once this has been started we can stop her IV fluids.  Monitor urine output.  Hypokalemia -Repletion ordered   Thyroidectomy -Status post total thyroidectomy.  Follow-up with ENT -Daily Synthroid    Body mass index is 21.19 kg/m.  Pressure Injury 02/12/20 Throat Mid Stage 2 -  Partial thickness loss of dermis presenting as a shallow open injury with a red, pink wound bed without slough. (Active)  02/12/20 1500  Location: Throat  Location Orientation: Mid  Staging: Stage 2 -  Partial thickness loss of dermis presenting as a shallow open injury with a red, pink wound bed without slough.  Wound Description (Comments):   Present on Admission: No      Subjective: Laying in the bed and wanting to get out of her mittens but still slightly confused.  Examination: Constitutional: Not in acute distress, trach and feeding tube in place Respiratory: Clear to auscultation bilaterally Cardiovascular: Normal sinus rhythm, no rubs Abdomen: Nontender nondistended good bowel sounds, PEG tube Musculoskeletal: No edema noted Skin: No rashes seen Neurologic: Very difficult to assess. Psychiatric: Alert to name, poor judgment and insight  Objective: Vitals:   02/22/20 2323 02/22/20 2330 02/23/20 0322 02/23/20 0446  BP: (!) 170/74  (!) 179/86   Pulse: (!) 101 92 88 97  Resp: (!) 23 20 18 18   Temp: 98.5 F (36.9 C)  98.4 F (36.9  C)   TempSrc: Axillary  Axillary   SpO2: 98% 97% 97% 97%  Weight:      Height:        Intake/Output Summary (Last 24 hours) at 02/23/2020 0806 Last data filed at 02/22/2020 1700 Gross per 24 hour  Intake 1456.27 ml  Output 850 ml  Net 606.27 ml   Filed Weights   02/18/20  0349 02/19/20 0500 02/20/20 0425  Weight: 68.3 kg 66.8 kg 65.1 kg     Data Reviewed:   CBC: Recent Labs  Lab 02/18/20 1831 02/20/20 0334 02/22/20 0402 02/23/20 0300  WBC 7.4 7.4 3.7* 5.0  NEUTROABS  --  5.9  --   --   HGB 10.0* 10.4* 9.7* 10.2*  HCT 31.6* 33.6* 31.8* 33.5*  MCV 82.5 83.8 84.8 84.6  PLT 289 275 317 335   Basic Metabolic Panel: Recent Labs  Lab 02/20/20 0334 02/22/20 0402 02/23/20 0300  NA 140 144 142  K 3.3* 3.5 3.1*  CL 104 108 106  CO2 22 20* 24  GLUCOSE 99 93 157*  BUN 9 8 <5*  CREATININE 0.66 0.68 0.62  CALCIUM 8.6* 8.5* 8.5*  MG  --  2.3 2.1   GFR: Estimated Creatinine Clearance: 74.9 mL/min (by C-G formula based on SCr of 0.62 mg/dL). Liver Function Tests: Recent Labs  Lab 02/20/20 0334  AST 26  ALT 39  ALKPHOS 76  BILITOT 1.5*  PROT 6.1*  ALBUMIN 2.6*   No results for input(s): LIPASE, AMYLASE in the last 168 hours. No results for input(s): AMMONIA in the last 168 hours. Coagulation Profile: Recent Labs  Lab 02/22/20 0446  INR 1.1   Cardiac Enzymes: No results for input(s): CKTOTAL, CKMB, CKMBINDEX, TROPONINI in the last 168 hours. BNP (last 3 results) No results for input(s): PROBNP in the last 8760 hours. HbA1C: No results for input(s): HGBA1C in the last 72 hours. CBG: Recent Labs  Lab 02/22/20 1213 02/22/20 1608 02/22/20 1925 02/22/20 2318 02/23/20 0410  GLUCAP 84 100* 118* 132* 154*   Lipid Profile: No results for input(s): CHOL, HDL, LDLCALC, TRIG, CHOLHDL, LDLDIRECT in the last 72 hours. Thyroid Function Tests: No results for input(s): TSH, T4TOTAL, FREET4, T3FREE, THYROIDAB in the last 72 hours. Anemia Panel: No results for input(s): VITAMINB12, FOLATE, FERRITIN, TIBC, IRON, RETICCTPCT in the last 72 hours. Sepsis Labs: No results for input(s): PROCALCITON, LATICACIDVEN in the last 168 hours.  Recent Results (from the past 240 hour(s))  Culture, respiratory (non-expectorated)     Status: None    Collection Time: 02/16/20  4:02 PM   Specimen: Tracheal Aspirate; Respiratory  Result Value Ref Range Status   Specimen Description TRACHEAL ASPIRATE  Final   Special Requests NONE  Final   Gram Stain   Final    ABUNDANT WBC PRESENT, PREDOMINANTLY PMN ABUNDANT GRAM NEGATIVE RODS MODERATE GRAM POSITIVE COCCI IN PAIRS FEW GRAM POSITIVE RODS    Culture   Final    Consistent with normal respiratory flora. No Pseudomonas species isolated Performed at [facility] [address] [place], [place] [address]    Report Status 02/18/2020 FINAL  Final  Aerobic Culture  (superficial specimen)     Status: None   Collection Time: 02/18/20  9:43 PM   Specimen: Wound  Result Value Ref Range Status   Specimen Description WOUND NECK  Final   Special Requests NONE  Final   Gram Stain   Final    MODERATE WBC PRESENT,BOTH PMN AND MONONUCLEAR ABUNDANT GRAM  POSITIVE COCCI ABUNDANT GRAM NEGATIVE RODS    Culture   Final    FEW MULTIPLE ORGANISMS PRESENT, NONE PREDOMINANT NO STAPHYLOCOCCUS AUREUS ISOLATED NO GROUP A STREP (S.PYOGENES) ISOLATED Performed at [facility], [place] [address], [place], [place] [address]    Report Status 02/20/2020 FINAL  Final         Radiology Studies: IR GASTROSTOMY TUBE MOD SED  Result Date: 02/22/2020 INDICATION: 64 year old female with a history of dysphagia EXAM: PERC PLACEMENT GASTROSTOMY MEDICATIONS: 2 g Ancef; Antibiotics were administered within 1 hour of the procedure. ANESTHESIA/SEDATION: Versed 2.0 mg IV; Fentanyl 100 mcg IV Moderate Sedation Time:  11 minutes The patient was continuously monitored during the procedure by the interventional radiology nurse under my direct supervision. CONTRAST:  20mL OMNIPAQUE IOHEXOL 300 MG/ML SOLN - administered into the gastric lumen. FLUOROSCOPY TIME:  Fluoroscopy Time: 2 minutes 18 seconds (42 mGy). COMPLICATIONS: None PROCEDURE: Informed written consent was obtained from the patient and the patient's family  after a thorough discussion of the procedural risks, benefits and alternatives. All questions were addressed. Maximal Sterile Barrier Technique was utilized including caps, mask, sterile gowns, sterile gloves, sterile drape, hand hygiene and skin antiseptic. A timeout was performed prior to the initiation of the procedure. The epigastrium was prepped with Betadine in a sterile fashion, and a sterile drape was applied covering the operative field. A sterile gown and sterile gloves were used for the procedure. A 5-French orogastric tube is placed under fluoroscopic guidance. Scout imaging of the abdomen confirms barium within the transverse colon. The stomach was distended with gas. Under fluoroscopic guidance, an 18 gauge needle was utilized to puncture the anterior wall of the body of the stomach. An Amplatz wire was advanced through the needle passing a T fastener into the lumen of the stomach. The T fastener was secured for gastropexy. A 9-French sheath was inserted. A snare was advanced through the 9-French sheath. A Britta Mccreedy was advanced through the orogastric tube. It was snared then pulled out the oral cavity, pulling the snare, as well. The leading edge of the gastrostomy was attached to the snare. It was then pulled down the esophagus and out the percutaneous site. Tube secured in place. Contrast was injected. Patient tolerated the procedure well and remained hemodynamically stable throughout. No complications were encountered and no significant blood loss encountered. IMPRESSION: Status post fluoroscopic placed percutaneous gastrostomy tube, with 20 [place] pull-through. Signed, Dulcy Fanny. Earleen Newport, DO [facility] Radiology Electronically Signed   By: Corrie Mckusick D.O.   On: 02/22/2020 15:52        Scheduled Meds:  amLODipine  10 mg Per Tube Daily   chlorhexidine gluconate (MEDLINE KIT)  15 mL Mouth Rinse BID   Chlorhexidine Gluconate Cloth  6 each  Topical Daily   docusate  100 mg Per Tube BID   feeding supplement (PROSource TF)  45 mL Per Tube BID   free water  300 mL Per Tube Q4H   heparin  5,000 Units Subcutaneous Q8H   hydrALAZINE  25 mg Per Tube Q8H   insulin aspart  0-20 Units Subcutaneous Q4H   labetalol  300 mg Per Tube Q8H   levothyroxine  100 mcg Per Tube Q0600   losartan  100 mg Per Tube Daily   mouth rinse  15 mL Mouth Rinse 10 times per day   metoCLOPramide  5 mg Per Tube Q6H   neomycin-bacitracin-polymyxin  1 application Topical Daily   pantoprazole sodium  40 mg Per Tube Daily   polyethylene glycol  17 g Per Tube Daily   polyvinyl alcohol  1 drop Left Eye BID   scopolamine  1 patch Transdermal Q72H   sodium chloride flush  10-40 mL Intracatheter Q12H   Continuous Infusions:  0.9 % NaCl with KCl 20 mEq / L 100 mL/hr at 02/22/20 0538    ceFAZolin (ANCEF) IV     ceFEPime (MAXIPIME) IV 2 g (02/22/20 2131)   dextrose 5 % and 0.45% NaCl 75 mL/hr at 02/22/20 1146   feeding supplement (OSMOLITE 1.2 CAL) Stopped (02/16/20 0955)   potassium chloride       LOS: 24 days   Time spent= 20 mins    Jamahl Lemmons Arsenio Loader, MD [facility]  If 7PM-7AM, please contact night-coverage  02/23/2020, 8:06 AM

## 2020-02-23 NOTE — Progress Notes (Signed)
Routine trach care done. Copious secretions noted, with several dressing changes throughout shift

## 2020-02-24 LAB — CBC
HCT: 27.9 % — ABNORMAL LOW (ref 36.0–46.0)
Hemoglobin: 8.5 g/dL — ABNORMAL LOW (ref 12.0–15.0)
MCH: 25.9 pg — ABNORMAL LOW (ref 26.0–34.0)
MCHC: 30.5 g/dL (ref 30.0–36.0)
MCV: 85.1 fL (ref 80.0–100.0)
Platelets: UNDETERMINED 10*3/uL (ref 150–400)
RBC: 3.28 MIL/uL — ABNORMAL LOW (ref 3.87–5.11)
RDW: 18 % — ABNORMAL HIGH (ref 11.5–15.5)
WBC: 2.8 10*3/uL — ABNORMAL LOW (ref 4.0–10.5)
nRBC: 0 % (ref 0.0–0.2)

## 2020-02-24 LAB — BASIC METABOLIC PANEL
Anion gap: 11 (ref 5–15)
BUN: 11 mg/dL (ref 8–23)
CO2: 21 mmol/L — ABNORMAL LOW (ref 22–32)
Calcium: 8 mg/dL — ABNORMAL LOW (ref 8.9–10.3)
Chloride: 109 mmol/L (ref 98–111)
Creatinine, Ser: 0.58 mg/dL (ref 0.44–1.00)
GFR, Estimated: >60 mL/min (ref >60)
Glucose, Bld: 120 mg/dL — ABNORMAL HIGH (ref 70–99)
Potassium: 3.7 mmol/L (ref 3.5–5.1)
Sodium: 141 mmol/L (ref 135–145)

## 2020-02-24 LAB — GLUCOSE, CAPILLARY
Glucose-Capillary: 116 mg/dL — ABNORMAL HIGH (ref 70–99)
Glucose-Capillary: 110 mg/dL — ABNORMAL HIGH (ref 70–99)
Glucose-Capillary: 116 mg/dL — ABNORMAL HIGH (ref 70–99)
Glucose-Capillary: 119 mg/dL — ABNORMAL HIGH (ref 70–99)
Glucose-Capillary: 113 mg/dL — ABNORMAL HIGH (ref 70–99)
Glucose-Capillary: 139 mg/dL — ABNORMAL HIGH (ref 70–99)

## 2020-02-24 LAB — TROPONIN I (HIGH SENSITIVITY): Troponin I (High Sensitivity): 58 ng/L — ABNORMAL HIGH (ref <18)

## 2020-02-24 LAB — MAGNESIUM: Magnesium: 2 mg/dL (ref 1.7–2.4)

## 2020-02-24 MED ORDER — OSMOLITE 1.2 CAL PO LIQD
1000.0000 mL | ORAL | Status: DC
  Administered 2020-02-24: 1000 mL
  Filled 2020-02-24: qty 1000

## 2020-02-24 NOTE — Progress Notes (Signed)
NEUROSURGERY PROGRESS NOTE  No acute events overnight. Awaiting CIR placement. Continue therapy today.   Temp:  [97.6 F (36.4 C)-98.6 F (37 C)] 97.9 F (36.6 C) (11/28 0801) Pulse Rate:  [68-106] 76 (11/28 0801) Resp:  [14-28] 16 (11/28 0801) BP: (88-169)/(55-96) 115/73 (11/28 0801) SpO2:  [93 %-100 %] 100 % (11/28 0801) FiO2 (%):  [21 %] 21 % (11/28 0440)    Eleonore Chiquito, NP 02/24/2020 8:24 AM

## 2020-02-24 NOTE — Progress Notes (Signed)
PROGRESS NOTE    Stacy Moran  UQJ:[id number] DOB: April 07, 1955 DOA: 01/30/2020 PCP: No primary care provider on file.   Brief Narrative:  64 y.o. 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   She had a tracheostomy and total thyroidectomy on 11/12; she has self-decanulated twice and is now in soft wrist restraints with a bedside sitter.   She has been recommended for G-tube placement.  She is awake and appears to be able to answer yes/no questions by head movements but is unable to talk.  She denies pain.  Appears to have cognitive function.Had aneurysm with clipping, prolonged intubation with enlarged thyroid.  She had thyroidectomy in order for trach to be placed.  She has been off the vent for a week and stable.  Needs routine trach care - Dr. Constance Holster is following.  Likely needs PEG - IR consult request placed.  Has sputum culture pending; on MBS she appears to have subglottal obstruction and so CT ordered and started on Cefepime.  Has developed HTN, DM while hospitalized.   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  We will closely monitor.   Tracheostomy with mild bleeding-controlled for now -Failed extubation now has trach.  Supportive care with concerns of infection therefore on cefepime.  Closely monitor bleeding and hemoglobin. -Scopolamine added for upper  respiratory secretion at trach site   Essential hypertension, labile -Suspect some autonomic dysfunction therefore I advised nursing staff to give him 5 cc of normal saline bolus if her systolic drops below 389.  In the meantime hold off on home blood pressure medications.  If it starts getting over 373 systolic, we can slowly start reintroducing blood pressure medications. -Discontinued losartan and labetalol.  Norvasc will only be given with systolic greater than 428.   Prediabetes -No known medical problems PTA -A1c on 11/3 was 6.2 -Sliding scale -Levemir 12 units discontinued until her feeding has been restarted.   Dysphagia -Failed swallow evaluation -G-tube placement placed 11/26.  Advised RN staff to reach out to dietitian to resume tube feeds at appropriate rate and increase as tolerated.  Hypokalemia -Repletion ordered   Thyroidectomy -Status post total thyroidectomy.  Follow-up with ENT -Daily Synthroid    Body mass index is 21.19 kg/m.  Pressure Injury 02/12/20 Throat Mid Stage 2 -  Partial thickness loss of dermis presenting as a shallow open injury with a red, pink wound bed without slough. (Active)  02/12/20 1500  Location: Throat  Location Orientation: Mid  Staging: Stage 2 -  Partial thickness loss of dermis presenting as a shallow open injury with a red, pink wound bed without slough.  Wound Description (Comments):   Present on Admission: No      Subjective: Patient does not have any complaintss.  Overnight had episode of hypotension and unresponsiveness which was evaluated.  CT of the head is negative, blood pressure stabilized after fluid bolus.  This morning doing okay.  Examination:  Constitutional: Chronically ill, tracheostomy site noted with some secretions. Respiratory: Anterior rhonchorous breath sounds. Cardiovascular: Normal sinus rhythm, no rubs Abdomen: Nontender nondistended good bowel sounds Musculoskeletal: No edema noted Skin: No rashes  seen Neurologic: Very difficult to assess as she does not follow all the commands Psychiatric: Poor judgment and insight, alert to her name only  Objective: Vitals:   02/24/20 0610 02/24/20 0640 02/24/20 0710 02/24/20 0801  BP: 125/68 (!) 106/57 114/63 115/73  Pulse: 73 73 76 76  Resp: 15 15 15 16   Temp:    97.9 F (36.6 C)  TempSrc:    Oral  SpO2: 99% 98% 99% 100%  Weight:      Height:        Intake/Output Summary (Last 24 hours) at 02/24/2020 0840 Last data filed at 02/24/2020 0542 Gross per 24 hour  Intake 1138.51 ml  Output 1700 ml  Net -561.49 ml   Filed Weights   02/18/20 0349 02/19/20 0500 02/20/20 0425  Weight: 68.3 kg 66.8 kg 65.1 kg     Data Reviewed:   CBC: Recent Labs  Lab 02/18/20 1831 02/20/20 0334 02/22/20 0402 02/23/20 0300 02/24/20 0033  WBC 7.4 7.4 3.7* 5.0 2.8*  NEUTROABS  --  5.9  --   --   --   HGB 10.0* 10.4* 9.7* 10.2* 8.5*  HCT 31.6* 33.6* 31.8* 33.5* 27.9*  MCV 82.5 83.8 84.8 84.6 85.1  PLT 289 275 317 292 PLATELET CLUMPS NOTED ON SMEAR, UNABLE TO ESTIMATE   Basic Metabolic Panel: Recent Labs  Lab 02/20/20 0334 02/22/20 0402 02/23/20 0300 02/24/20 0033  NA 140 144 142 141  K 3.3* 3.5 3.1* 3.7  CL 104 108 106 109  CO2 22 20* 24 21*  GLUCOSE 99 93 157* 120*  BUN 9 8 <5* 11  CREATININE 0.66 0.68 0.62 0.58  CALCIUM 8.6* 8.5* 8.5* 8.0*  MG  --  2.3 2.1 2.0   GFR: Estimated Creatinine Clearance: 74.9 mL/min (by C-G formula based on SCr of 0.58 mg/dL). Liver Function Tests: Recent Labs  Lab 02/20/20 0334  AST 26  ALT 39  ALKPHOS 76  BILITOT 1.5*  PROT 6.1*  ALBUMIN 2.6*   No results for input(s): LIPASE, AMYLASE in the last 168 hours. No results for input(s): AMMONIA in the last 168 hours. Coagulation Profile: Recent Labs  Lab 02/22/20 0446  INR 1.1   Cardiac Enzymes: No results for input(s): CKTOTAL, CKMB, CKMBINDEX, TROPONINI in the last 168 hours. BNP (last 3 results) No results for input(s): PROBNP in the  last 8760 hours. HbA1C: No results for input(s): HGBA1C in the last 72 hours. CBG: Recent Labs  Lab 02/23/20 1623 02/23/20 2004 02/23/20 2336 02/24/20 0429 02/24/20 0759  GLUCAP 100* 126* 118* 116* 110*   Lipid Profile: No results for input(s): CHOL, HDL, LDLCALC, TRIG, CHOLHDL, LDLDIRECT in the last 72 hours. Thyroid Function Tests: No results for input(s): TSH, T4TOTAL, FREET4, T3FREE, THYROIDAB in the last 72 hours. Anemia Panel: No results for input(s): VITAMINB12, FOLATE, FERRITIN, TIBC, IRON, RETICCTPCT in the last 72 hours. Sepsis Labs: No results for input(s): PROCALCITON, LATICACIDVEN in the last 168 hours.  Recent Results (from the past 240 hour(s))  Culture, respiratory (non-expectorated)     Status: None   Collection Time: 02/16/20  4:02 PM   Specimen: Tracheal Aspirate; Respiratory  Result Value Ref Range Status   Specimen Description TRACHEAL ASPIRATE  Final   Special Requests NONE  Final   Gram Stain   Final  ABUNDANT WBC PRESENT, PREDOMINANTLY PMN ABUNDANT GRAM NEGATIVE RODS MODERATE GRAM POSITIVE COCCI IN PAIRS FEW GRAM POSITIVE RODS    Culture   Final    Consistent with normal respiratory flora. No Pseudomonas species isolated Performed at [facility] [address], [place], [place] [address]    Report Status 02/18/2020 FINAL  Final  Aerobic Culture (superficial specimen)     Status: None   Collection Time: 02/18/20  9:43 PM   Specimen: Wound  Result Value Ref Range Status   Specimen Description WOUND NECK  Final   Special Requests NONE  Final   Gram Stain   Final    MODERATE WBC PRESENT,BOTH PMN AND MONONUCLEAR ABUNDANT GRAM POSITIVE COCCI ABUNDANT GRAM NEGATIVE RODS    Culture   Final    FEW MULTIPLE ORGANISMS PRESENT, NONE PREDOMINANT NO STAPHYLOCOCCUS AUREUS ISOLATED NO GROUP A STREP (S.PYOGENES) ISOLATED Performed at [facility], [place] [address], [place],  [address]    Report Status 02/20/2020 FINAL  Final          Radiology Studies: CT HEAD WO CONTRAST  Addendum Date: 02/23/2020   ADDENDUM REPORT: 02/23/2020 22:07 ADDENDUM: Upon further evaluation, no definite left frontal 4 mm extra-axial fluid collection. Electronically Signed   By: Iven Finn M.D.   On: 02/23/2020 22:07   Result Date: 02/23/2020 CLINICAL DATA:  Mental status change unknown. The period of unresponsiveness. EXAM: CT HEAD WITHOUT CONTRAST TECHNIQUE: Contiguous axial images were obtained from the base of the skull through the vertex without intravenous contrast. COMPARISON:  CT head 02/15/2020, CT neck 02/20/2020. FINDINGS: Brain: Patchy and confluent areas of decreased attenuation are noted throughout the deep and periventricular white matter of the cerebral hemispheres bilaterally, compatible with chronic microvascular ischemic disease. No evidence of large-territorial acute infarction. No parenchymal hemorrhage. No mass lesion. Redemonstration of a right calvarial convexity fluid collection now appearing homogeneous and slightly more hypodense compared to CT head 02/15/2020 (hypodense compared to the gray cortex but higher density than cerebrospinal fluid). This extra fluid collection corresponds to a subacute to chronic right subdural hematoma and measures stable in size up to 1.2 cm (from an acute subdural hemorrhage measuring up to 7 mm on 03/16/2020). Suggestion of a similar-appearing subacute acute left frontal extra-axial fluid collection measuring up to 4 mm. Interval resolution of previously noted trace subarachnoid hemorrhage. The Similar-appearing partial effacement of the right cerebrum sulci. Stable 3 mm right to left midline shift. The basilar cisterns are patent. No hydrocephalus. No intraventricular hemorrhage. Vascular: No hyperdense vessel. Aneurysmal clip noted along the foramen magnum. Skull: Prior left cerebellar calvarial craniotomy with suture fasteners. No acute fracture or focal lesion. Sinuses/Orbits:  Paranasal sinuses and mastoid air cells are clear. The orbits are unremarkable. Other: Skin staples overlie the left posterior scalp. IMPRESSION: 1. Stable now subacute to chronic 1.2 cm right subdural hematoma with stable 3 mm right to left midline shift. 2. Interval resolution of previously noted trace subarachnoid hemorrhage. 3. Suggestion of a subacute to chronic stable left frontal 4 mm extra-axial fluid collection. Electronically Signed: By: Iven Finn M.D. On: 02/23/2020 21:46   IR GASTROSTOMY TUBE MOD SED  Result Date: 02/22/2020 INDICATION: 64 year old female with a history of dysphagia EXAM: PERC PLACEMENT GASTROSTOMY MEDICATIONS: 2 g Ancef; Antibiotics were administered within 1 hour of the procedure. ANESTHESIA/SEDATION: Versed 2.0 mg IV; Fentanyl 100 mcg IV Moderate Sedation Time:  11 minutes The patient was continuously monitored during the procedure by the interventional radiology nurse  under my direct supervision. CONTRAST:  40m OMNIPAQUE IOHEXOL 300 MG/ML SOLN - administered into the gastric lumen. FLUOROSCOPY TIME:  Fluoroscopy Time: 2 minutes 18 seconds (42 mGy). COMPLICATIONS: None PROCEDURE: Informed written consent was obtained from the patient and the patient's family after a thorough discussion of the procedural risks, benefits and alternatives. All questions were addressed. Maximal Sterile Barrier Technique was utilized including caps, mask, sterile gowns, sterile gloves, sterile drape, hand hygiene and skin antiseptic. A timeout was performed prior to the initiation of the procedure. The epigastrium was prepped with Betadine in a sterile fashion, and a sterile drape was applied covering the operative field. A sterile gown and sterile gloves were used for the procedure. A 5-French orogastric tube is placed under fluoroscopic guidance. Scout imaging of the abdomen confirms barium within the transverse colon. The stomach was distended with gas. Under fluoroscopic guidance, an 18  gauge needle was utilized to puncture the anterior wall of the body of the stomach. An Amplatz wire was advanced through the needle passing a T fastener into the lumen of the stomach. The T fastener was secured for gastropexy. A 9-French sheath was inserted. A snare was advanced through the 9-French sheath. A BBritta Mccreedywas advanced through the orogastric tube. It was snared then pulled out the oral cavity, pulling the snare, as well. The leading edge of the gastrostomy was attached to the snare. It was then pulled down the esophagus and out the percutaneous site. Tube secured in place. Contrast was injected. Patient tolerated the procedure well and remained hemodynamically stable throughout. No complications were encountered and no significant blood loss encountered. IMPRESSION: Status post fluoroscopic placed percutaneous gastrostomy tube, with 20 [place]through. Signed, JDulcy Fanny WEarleen Newport DO [facility] Electronically Signed   By: JCorrie MckusickD.O.   On: 02/22/2020 15:52        Scheduled Meds: . amLODipine  10 mg Per Tube Daily  . chlorhexidine gluconate (MEDLINE KIT)  15 mL Mouth Rinse BID  . Chlorhexidine Gluconate Cloth  6 each Topical Daily  . docusate  100 mg Per Tube BID  . feeding supplement (PROSource TF)  45 mL Per Tube BID  . free water  300 mL Per Tube Q4H  . heparin  5,000 Units Subcutaneous Q8H  . hydrALAZINE  25 mg Per Tube Q8H  . insulin aspart  0-20 Units Subcutaneous Q4H  . labetalol  300 mg Per Tube Q8H  . levothyroxine  100 mcg Per Tube Q0600  . losartan  100 mg Per Tube Daily  . mouth rinse  15 mL Mouth Rinse 10 times per day  . metoCLOPramide  5 mg Per Tube Q6H  . neomycin-bacitracin-polymyxin  1 application Topical Daily  . pantoprazole sodium  40 mg Per Tube Daily  . polyethylene glycol  17 g Per Tube Daily  . polyvinyl alcohol  1 drop Left Eye BID  . scopolamine  1 patch Transdermal Q72H  . sodium  chloride flush  10-40 mL Intracatheter Q12H   Continuous Infusions: . 0.9 % NaCl with KCl 20 mEq / L 100 mL/hr at 02/24/20 0532  .  ceFAZolin (ANCEF) IV    . ceFEPime (MAXIPIME) IV Stopped (02/23/20 2330)  . feeding supplement (OSMOLITE 1.2 CAL) Stopped (02/16/20 0955)     LOS: 25 days   Time spent= 20 mins    Shyvonne Chastang CArsenio Loader MD [facility]  If 7PM-7AM, please contact night-coverage  02/24/2020, 8:40 AM

## 2020-02-24 NOTE — Plan of Care (Signed)
  Problem: Education: Goal: Knowledge of disease or condition will improve Outcome: Progressing Goal: Knowledge of secondary prevention will improve Outcome: Progressing   Problem: Coping: Goal: Will verbalize positive feelings about self Outcome: Progressing Goal: Will identify appropriate support needs Outcome: Progressing

## 2020-02-24 NOTE — Progress Notes (Deleted)
Subjective: Patient reports Somnolent difficult to arouse this morning  Objective: Vital signs in last 24 hours: Temp:  [97.6 F (36.4 C)-98.6 F (37 C)] 97.9 F (36.6 C) (11/28 0801) Pulse Rate:  [68-106] 76 (11/28 0801) Resp:  [14-28] 16 (11/28 0801) BP: (88-169)/(55-96) 115/73 (11/28 0801) SpO2:  [93 %-100 %] 100 % (11/28 0801) FiO2 (%):  [21 %] 21 % (11/28 0440)  Intake/Output from previous day: 11/27 0701 - 11/28 0700 In: 1138.5 [I.V.:438.5; NG/GT:600; IV Piggyback:100] Out: 1700 [Urine:1000] Intake/Output this shift: No intake/output data recorded.  Apparently would awaken throughout the night follow commands and was improved from yesterday morning.  However somnolent this morning would not follow commands for me CT scan yesterday did show hydrodrain was set at 15 cmH2O I lowered it to 10.  Lab Results: Recent Labs    02/23/20 0300 02/24/20 0033  WBC 5.0 2.8*  HGB 10.2* 8.5*  HCT 33.5* 27.9*  PLT 292 PLATELET CLUMPS NOTED ON SMEAR, UNABLE TO ESTIMATE   BMET Recent Labs    02/23/20 0300 02/24/20 0033  NA 142 141  K 3.1* 3.7  CL 106 109  CO2 24 21*  GLUCOSE 157* 120*  BUN <5* 11  CREATININE 0.62 0.58  CALCIUM 8.5* 8.0*    Studies/Results: CT HEAD WO CONTRAST  Addendum Date: 02/23/2020   ADDENDUM REPORT: 02/23/2020 22:07 ADDENDUM: Upon further evaluation, no definite left frontal 4 mm extra-axial fluid collection. Electronically Signed   By: Iven Finn M.D.   On: 02/23/2020 22:07   Result Date: 02/23/2020 CLINICAL DATA:  Mental status change unknown. The period of unresponsiveness. EXAM: CT HEAD WITHOUT CONTRAST TECHNIQUE: Contiguous axial images were obtained from the base of the skull through the vertex without intravenous contrast. COMPARISON:  CT head 02/15/2020, CT neck 02/20/2020. FINDINGS: Brain: Patchy and confluent areas of decreased attenuation are noted throughout the deep and periventricular white matter of the cerebral hemispheres  bilaterally, compatible with chronic microvascular ischemic disease. No evidence of large-territorial acute infarction. No parenchymal hemorrhage. No mass lesion. Redemonstration of a right calvarial convexity fluid collection now appearing homogeneous and slightly more hypodense compared to CT head 02/15/2020 (hypodense compared to the gray cortex but higher density than cerebrospinal fluid). This extra fluid collection corresponds to a subacute to chronic right subdural hematoma and measures stable in size up to 1.2 cm (from an acute subdural hemorrhage measuring up to 7 mm on 03/16/2020). Suggestion of a similar-appearing subacute acute left frontal extra-axial fluid collection measuring up to 4 mm. Interval resolution of previously noted trace subarachnoid hemorrhage. The Similar-appearing partial effacement of the right cerebrum sulci. Stable 3 mm right to left midline shift. The basilar cisterns are patent. No hydrocephalus. No intraventricular hemorrhage. Vascular: No hyperdense vessel. Aneurysmal clip noted along the foramen magnum. Skull: Prior left cerebellar calvarial craniotomy with suture fasteners. No acute fracture or focal lesion. Sinuses/Orbits: Paranasal sinuses and mastoid air cells are clear. The orbits are unremarkable. Other: Skin staples overlie the left posterior scalp. IMPRESSION: 1. Stable now subacute to chronic 1.2 cm right subdural hematoma with stable 3 mm right to left midline shift. 2. Interval resolution of previously noted trace subarachnoid hemorrhage. 3. Suggestion of a subacute to chronic stable left frontal 4 mm extra-axial fluid collection. Electronically Signed: By: Iven Finn M.D. On: 02/23/2020 21:46   IR GASTROSTOMY TUBE MOD SED  Result Date: 02/22/2020 INDICATION: 64 year old female with a history of dysphagia EXAM: PERC PLACEMENT GASTROSTOMY MEDICATIONS: 2 g Ancef; Antibiotics were administered within 1  hour of the procedure. ANESTHESIA/SEDATION: Versed 2.0 mg  IV; Fentanyl 100 mcg IV Moderate Sedation Time:  11 minutes The patient was continuously monitored during the procedure by the interventional radiology nurse under my direct supervision. CONTRAST:  34mL OMNIPAQUE IOHEXOL 300 MG/ML SOLN - administered into the gastric lumen. FLUOROSCOPY TIME:  Fluoroscopy Time: 2 minutes 18 seconds (42 mGy). COMPLICATIONS: None PROCEDURE: Informed written consent was obtained from the patient and the patient's family after a thorough discussion of the procedural risks, benefits and alternatives. All questions were addressed. Maximal Sterile Barrier Technique was utilized including caps, mask, sterile gowns, sterile gloves, sterile drape, hand hygiene and skin antiseptic. A timeout was performed prior to the initiation of the procedure. The epigastrium was prepped with Betadine in a sterile fashion, and a sterile drape was applied covering the operative field. A sterile gown and sterile gloves were used for the procedure. A 5-French orogastric tube is placed under fluoroscopic guidance. Scout imaging of the abdomen confirms barium within the transverse colon. The stomach was distended with gas. Under fluoroscopic guidance, an 18 gauge needle was utilized to puncture the anterior wall of the body of the stomach. An Amplatz wire was advanced through the needle passing a T fastener into the lumen of the stomach. The T fastener was secured for gastropexy. A 9-French sheath was inserted. A snare was advanced through the 9-French sheath. A Britta Mccreedy was advanced through the orogastric tube. It was snared then pulled out the oral cavity, pulling the snare, as well. The leading edge of the gastrostomy was attached to the snare. It was then pulled down the esophagus and out the percutaneous site. Tube secured in place. Contrast was injected. Patient tolerated the procedure well and remained hemodynamically stable throughout. No complications were encountered and no significant blood loss  encountered. IMPRESSION: Status post fluoroscopic placed percutaneous gastrostomy tube, with 20 [place] pull-through. Signed, Dulcy Fanny. Earleen Newport, DO [facility] Electronically Signed   By: Corrie Mckusick D.O.   On: 02/22/2020 15:52    Assessment/Plan: Continue EVD for now now lowered to 10 cmH2O observe...repeat CT scan in the morning.  LOS: 25 days     Elaina Hoops 02/24/2020, 8:28 AM

## 2020-02-24 NOTE — Progress Notes (Signed)
Called by RN: Pt has dislodged trach. Pt's trach replaced with no complications noted. ETC02 positive color change. Suctioned out thick tan secretions. Pt is in No distress with a Sp02 99%

## 2020-02-25 ENCOUNTER — Inpatient Hospital Stay (HOSPITAL_COMMUNITY): Payer: Self-pay

## 2020-02-25 LAB — GLUCOSE, CAPILLARY
Glucose-Capillary: 70 mg/dL (ref 70–99)
Glucose-Capillary: 140 mg/dL — ABNORMAL HIGH (ref 70–99)
Glucose-Capillary: 94 mg/dL (ref 70–99)
Glucose-Capillary: 134 mg/dL — ABNORMAL HIGH (ref 70–99)
Glucose-Capillary: 109 mg/dL — ABNORMAL HIGH (ref 70–99)
Glucose-Capillary: 119 mg/dL — ABNORMAL HIGH (ref 70–99)

## 2020-02-25 LAB — CBC
HCT: 27.4 % — ABNORMAL LOW (ref 36.0–46.0)
Hemoglobin: 8.4 g/dL — ABNORMAL LOW (ref 12.0–15.0)
MCH: 26.4 pg (ref 26.0–34.0)
MCHC: 30.7 g/dL (ref 30.0–36.0)
MCV: 86.2 fL (ref 80.0–100.0)
Platelets: 205 10*3/uL (ref 150–400)
RBC: 3.18 MIL/uL — ABNORMAL LOW (ref 3.87–5.11)
RDW: 18.3 % — ABNORMAL HIGH (ref 11.5–15.5)
WBC: 2.8 10*3/uL — ABNORMAL LOW (ref 4.0–10.5)
nRBC: 2.8 % — ABNORMAL HIGH (ref 0.0–0.2)

## 2020-02-25 LAB — BASIC METABOLIC PANEL
Anion gap: 9 (ref 5–15)
BUN: 12 mg/dL (ref 8–23)
CO2: 22 mmol/L (ref 22–32)
Calcium: 7.8 mg/dL — ABNORMAL LOW (ref 8.9–10.3)
Chloride: 108 mmol/L (ref 98–111)
Creatinine, Ser: 0.49 mg/dL (ref 0.44–1.00)
GFR, Estimated: >60 mL/min (ref >60)
Glucose, Bld: 95 mg/dL (ref 70–99)
Potassium: 3.7 mmol/L (ref 3.5–5.1)
Sodium: 139 mmol/L (ref 135–145)

## 2020-02-25 LAB — MAGNESIUM: Magnesium: 2 mg/dL (ref 1.7–2.4)

## 2020-02-25 IMAGING — CT CT HEAD W/O CM
3 of 4 series · 16 of 47 positions shown, 19 images · non-contrast
Comparison: [DATE]

CLINICAL DATA: Subdural hematoma follow-up

EXAM:
CT HEAD WITHOUT CONTRAST
TECHNIQUE: Contiguous axial images were obtained from the base of the skull
through the vertex without intravenous contrast.

[Series 5: head 3.0 mpr cor · coronal · 0.37mm/px · 3 of 74 slices shown]
[im 25/74  brain]
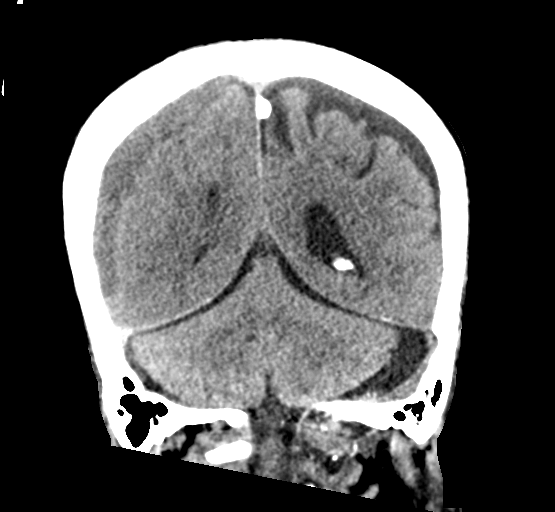
[im 33/74  brain]
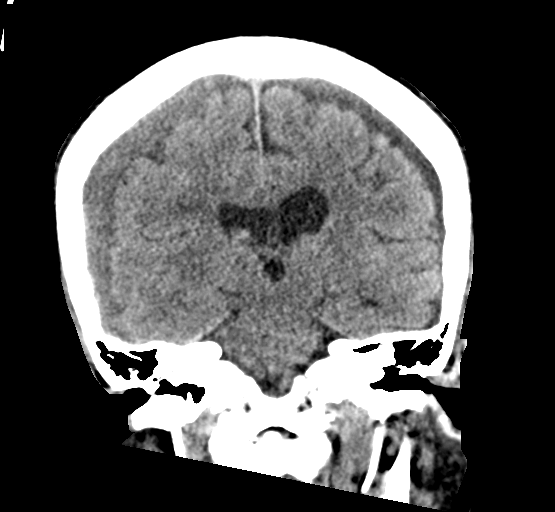
[im 41/74  brain]
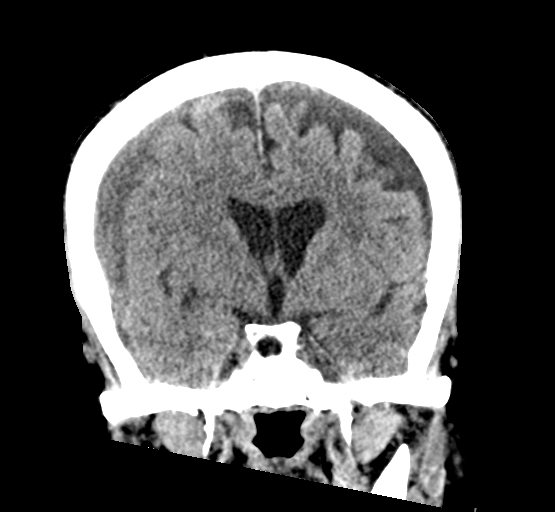

[Series 6: head 3.0 mpr sag · sagittal · 0.35mm/px · 3 of 64 slices shown]
[im 27/64  brain]
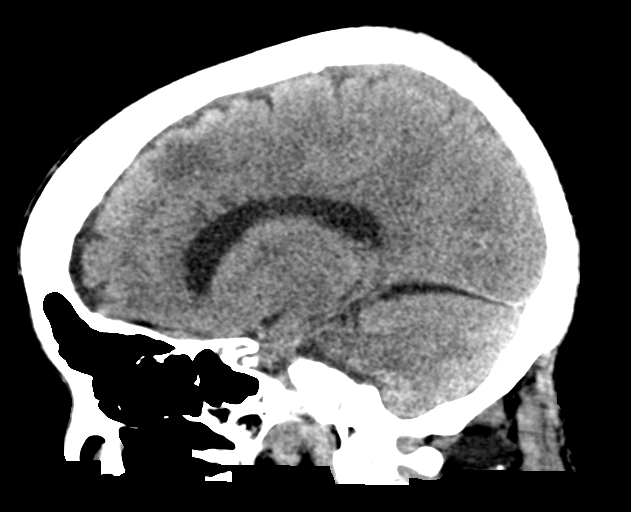
[im 32/64  brain]
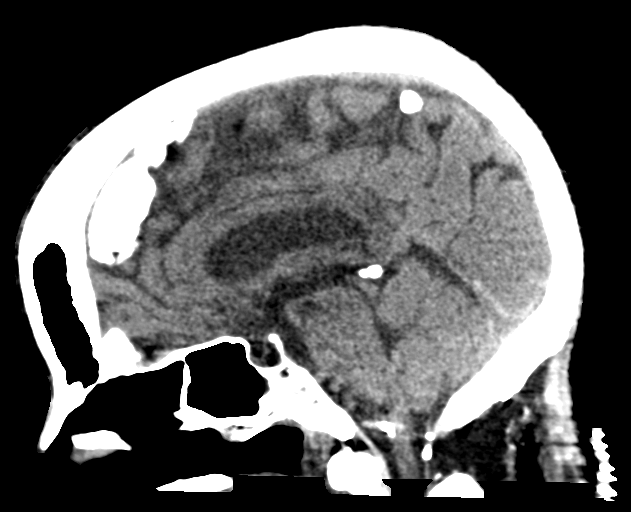
[im 36/64  brain]
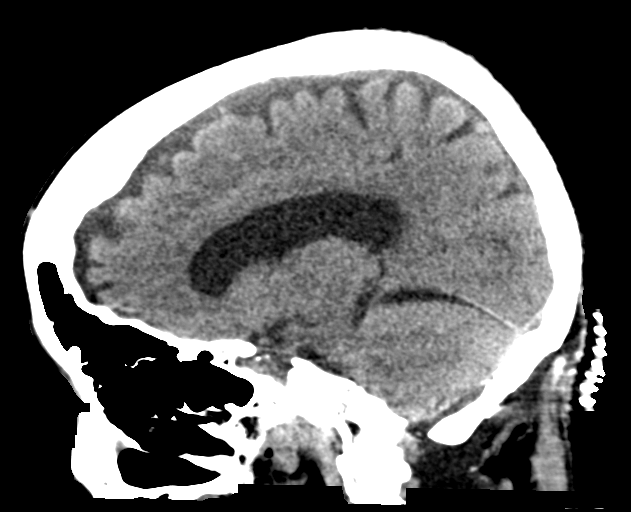

[Series 8: head 2.0 mpr ax · axial · 0.48mm/px · z∈[-217,-67]mm · 10 of 88 slices shown, 13 images]
[im 5/88  brain]
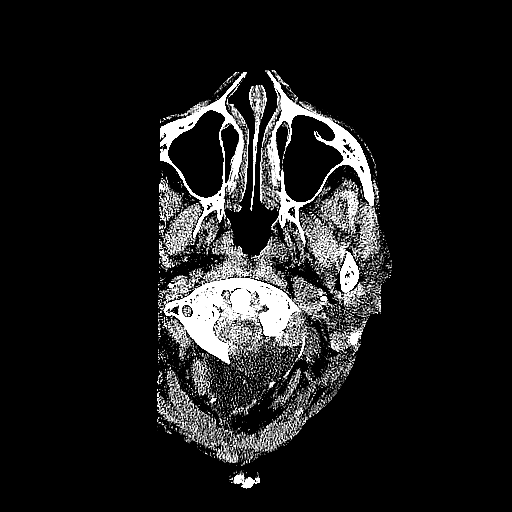
[im 5/88  bone]
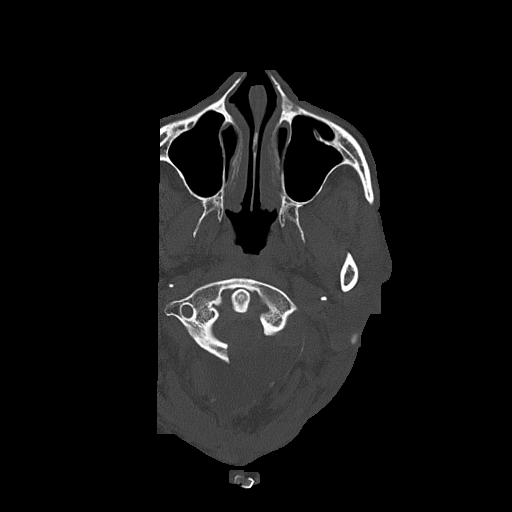
[im 14/88  brain]
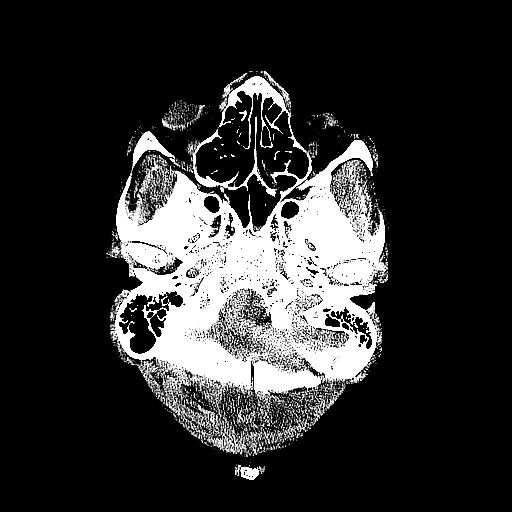
[im 22/88  brain]
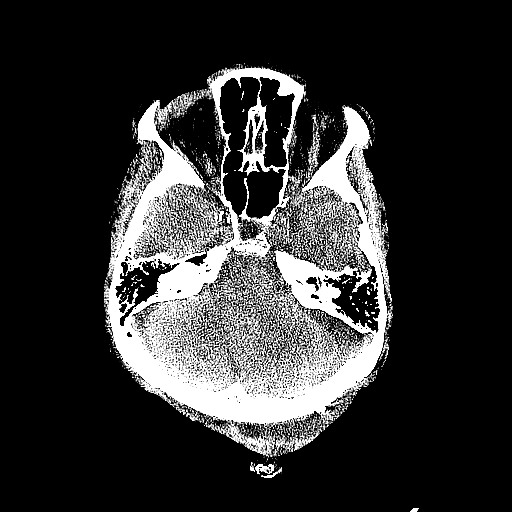
[im 31/88  brain]
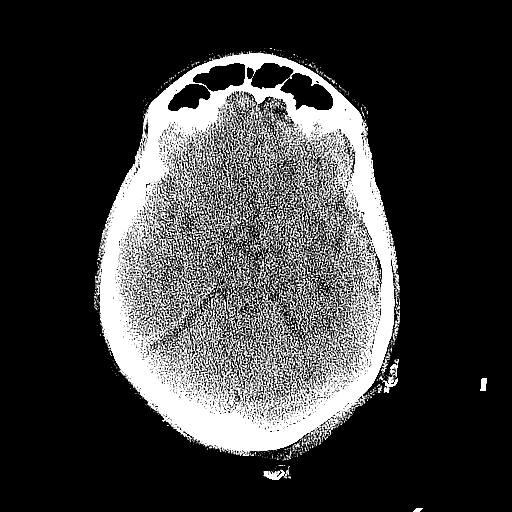
[im 40/88  brain]
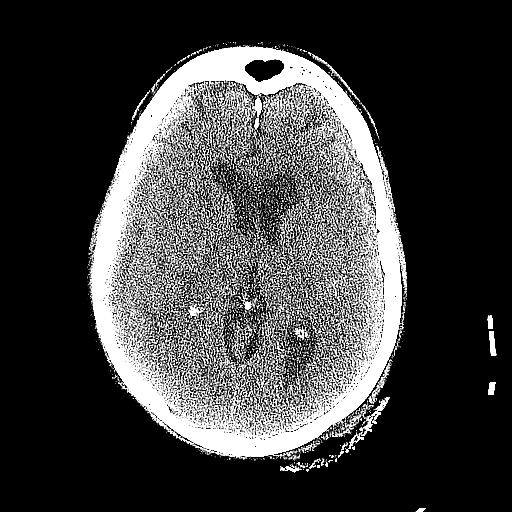
[im 40/88  bone]
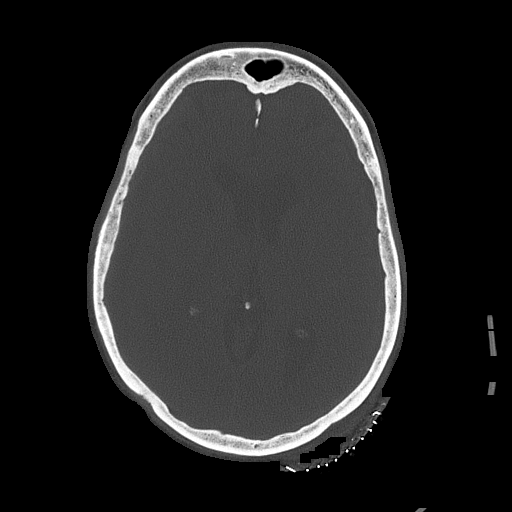
[im 48/88  brain]
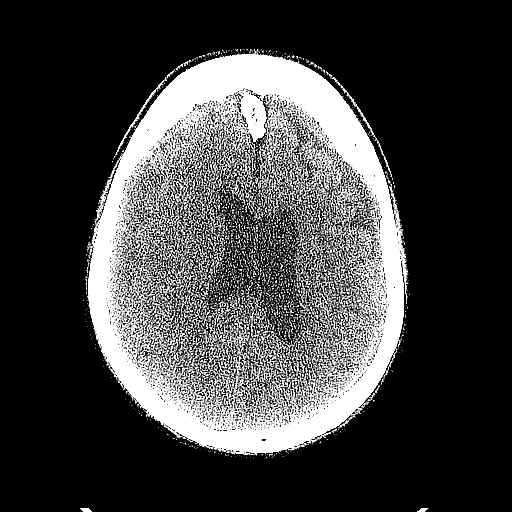
[im 57/88  brain]
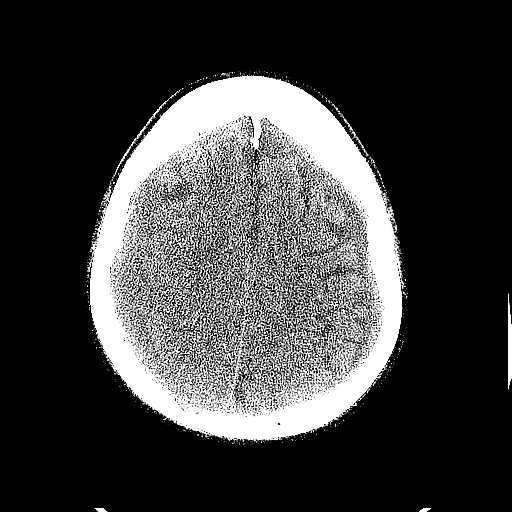
[im 66/88  brain]
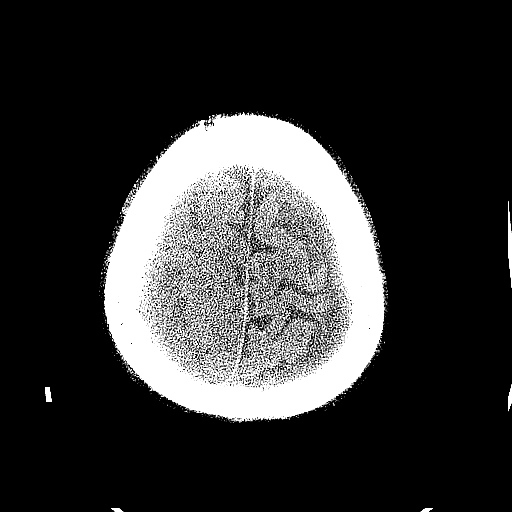
[im 74/88  brain]
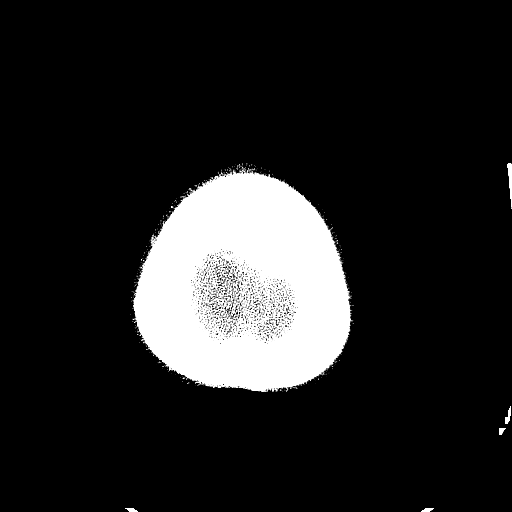
[im 74/88  bone]
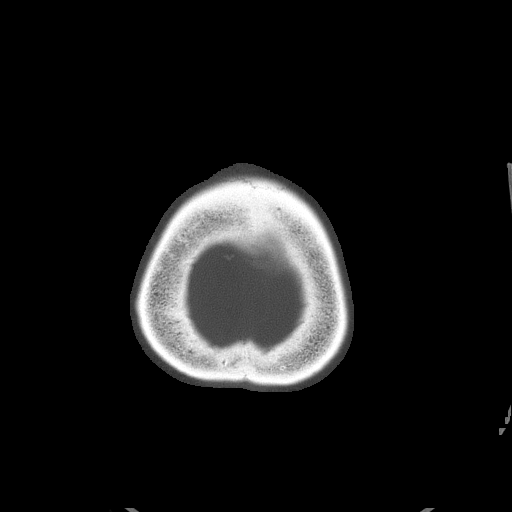
[im 83/88  brain]
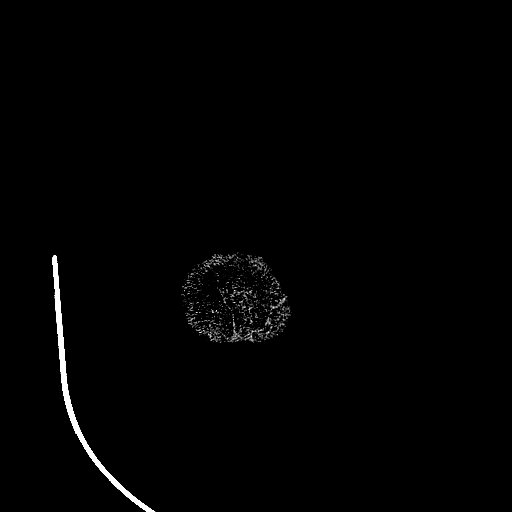

[16 of 47 positions shown; findings below may reference images not displayed]

FINDINGS: Brain: Unchanged size of right holo hemispheric subdural hematoma
with minimal leftward midline shift. No acute hemorrhage. Size and
configuration of the ventricles are unchanged. Unchanged small
vessel infarcts of the right basal ganglia.

Vascular: No hyperdense vessel or unexpected calcification.

Skull: Remote left suboccipital craniotomy. Right frontal burr hole.

Sinuses/Orbits: Negative

Other: None
IMPRESSION: Unchanged size of right holo hemispheric subdural hematoma with
minimal leftward midline shift.

## 2020-02-25 MED ORDER — OSMOLITE 1.2 CAL PO LIQD
1000.0000 mL | ORAL | Status: DC
  Administered 2020-02-25 – 2020-04-21 (×45): 1000 mL
  Filled 2020-02-25 (×33): qty 1000

## 2020-02-25 NOTE — Progress Notes (Signed)
Nutrition Follow-up  DOCUMENTATION CODES:   Not applicable  INTERVENTION:  Continue via PEG: -Osmolite 1.2 @ 6m/hr (14433m -4569mrosource TF BID  Provides 1819 kcal, 101 grams protein, 1167m44mee water daily   Will d/c free water flushes as pt is receiving IV fluids @ 100ml40m If IV fluids are being discontinued, recommend resuming free water flushes.   NUTRITION DIAGNOSIS:   Inadequate oral intake related to acute illness as evidenced by estimated needs.  ongoing  GOAL:   Patient will meet greater than or equal to 90% of their needs  Met with TF  MONITOR:   Vent status, Labs, Weight trends, Skin, I & O's  REASON FOR ASSESSMENT:   Ventilator    ASSESSMENT:   64 ye47 old female who presented to the ED on 11/03 with headache. 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 Pt required intubation for airway protection.  11/03 Admitted 11/04 SAH, L PICA aneurysm to OR for craniotomy, clipping  11/09 extubated but immediately re-intubated 11/12 s/p thyroidectomy with tracheostomy; cortrak placed; tip gastric  11/16 EVD removed 11/17 trach exchange to facilitate PMV 11/20 Cortrak removed by pt 11/22 trach change (pt self-decannulated) 11/26 PEG placed  SLP was consulted to evaluate pt for po readiness. Per SLP, pt is still not medically ready as no interventions have been put in place to address patency of pts upper airway. SLP notes that pt will not be able to tolerate PMSV or eat/drink until inflammatory process seen on MBS and CT are addressed. Pt continues to receive TF via PEG.  Discussed pt with RN and confirmed that pt is receiving TF at goal rate and tolerating well.  Current TF: Osmolite 1.2 cal @ 60ml/57m45ml P31murce TF BID, 300ml fr50mater Q4H  Admit wt: 66.9 kg Current wt: 68.8 kg  UOP: 450ml x2413mrs  Labs: CBGs 70-140-94 Medications: colace, ss novolog Q4H, reglan, miralax IVF: NS w/ KCl  20mEq @ 175m/hr  D22mOrder:   Diet Order            Diet NPO time specified Except for: Sips with Meds  Diet effective midnight                 EDUCATION NEEDS:   No education needs have been identified at this time  Skin:  Skin Assessment: Skin Integrity Issues: Skin Integrity Issues:: Stage II, Other (Comment), Incisions Stage II: throat Incisions: neck, head Other: skin tear back  Last BM:  11/28  Height:   Ht Readings from Last 1 Encounters:  01/30/20 _0  (1.753 m)    Weight:   Wt Readings from Last 1 Encounters:  02/25/20 68.8 kg    Ideal Body Weight:  65.9 kg  BMI:  Body mass index is 22.4 kg/m.  Estimated Nutritional Needs:   Kcal:  1700-1900  Protein:  95-110 grams  Fluid:  > 1.7 L/day    Chea Malan AverLarkin InaDN RD pager number and weekend/on-call pager number located in [email or web address]

## 2020-02-25 NOTE — Progress Notes (Addendum)
Inpatient Rehabilitation Admissions Coordinator  Met with patient at bedside with NT. Patient not yet at level to tolerate the intensity of an inpt rehab admit. Continues with increased trach secretions.  Danne Baxter, RN, MSN Rehab Admissions Coordinator [phone] 02/25/2020 12:45 PM

## 2020-02-25 NOTE — Progress Notes (Addendum)
SLP Cancellation Note  Patient Details Name: Stacy Moran MRN: [id number] DOB: 1955-06-13   Cancelled treatment:       Reason Eval/Treat Not Completed: Patient not medically ready. No interventions yet to address patency of pts upper airway. Pt will not be able to tolerate PMSV or eat or drink until inflammatory process seen on MBS and CT are addressed. Please view DG swallow function on 11/23 to view severity of supraglottic edema.  The image below is a screen shot of pt at rest, not during swallow/adduction, which shows that liquid does not pass through the glottis due to obstructing tissue. Discussed with MD.     Herbie Baltimore, MA CCC-SLP  Acute Rehabilitation Services Pager [phone] Office [phone]   [name] 02/25/2020, 11:39 AM

## 2020-02-25 NOTE — Progress Notes (Signed)
IV haldol 1 mg given to patient for agitation,  Anxiety and restlessness at 1244. Patient tolerated medication well with no side effect noted. Will continue to monitor.

## 2020-02-25 NOTE — Progress Notes (Signed)
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  11/4, pt s/p L far-lateral craniotomy for clipping of PICA aneurysm. 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    PT Comments    Pt is making steady progress towards her goals. She continues to be impulsive and demonstrate poor coordination through her legs and arms, impacting her safety with mobility. In addition, she requires extensive single-step commands to problem-solve and sequence tasks with functional mobility. Pt is very motivated to participate and improve. She required minAx1 to roll and minAx2 to come to sit with HOB elevated. She only needed minAx2 to come to stand with a RW but maxAx2 to take any steps to transfer to the recliner or to ambulate short distances anteriorly then posteriorly in the room. She demonstrates deficits in L leg muscular strength, resulting in her needing physical assistance to prevent knee buckling in stance and to advance leg during swing phase of gait. She also is unable to coordinate weight shifting for each step without verbal and tactile cues. Will continue to follow acutely. Current recommendations remain appropriate.   Follow Up Recommendations  CIR;Supervision/Assistance - 24 hour     Equipment Recommendations  Wheelchair (measurements PT);Wheelchair cushion (measurements PT)    Recommendations for Other Services       Precautions / Restrictions Precautions Precautions: Fall;Other (comment) Precaution Comments: trach, wrist restraints Restrictions Weight Bearing Restrictions: No    Mobility  Bed Mobility Overal bed mobility: Needs  Assistance Bed Mobility: Rolling;Sidelying to Sit Rolling: Min assist Sidelying to sit: Min assist;HOB elevated;+2 for physical assistance       General bed mobility comments: Rolling L and R 1x for pericare purposes, cues provided for hand placement and minA to complete due to incoordination. Sidelying to sit with minAx2 to manage trunk and legs. Cues to transition onto L elbow > hand to ascend trunk.  Transfers Overall transfer level: Needs assistance Equipment used: Rolling walker (2 wheeled) Transfers: Sit to/from [facility] Sit to Stand: Min assist;+2 physical assistance;+2 safety/equipment Stand pivot transfers: Max assist;+2 physical assistance;+2 safety/equipment       General transfer comment: MinAx2 with cues at L quads to extend knees and at pelvis to bring hips anteriorly to come to stand, x2 reps. Tactile and verbal cues provided for hand placement. MaxAx2 to take side steps to L to transfer stand step to recliner from EOB, requiring extensive cues and assistance to shift weight and move each leg.  Ambulation/Gait Ambulation/Gait assistance: Max assist;+2 physical assistance;+2 safety/equipment Gait Distance (Feet): 6 Feet (~3 ft anterior then ~3 ft posterior) Assistive device: Rolling walker (2 wheeled) Gait Pattern/deviations: Step-to pattern;Decreased stride length;Decreased step length - left;Decreased stance time - left;Leaning posteriorly;Trunk flexed;Decreased weight shift to right;Decreased weight shift to left Gait velocity: decreased Gait velocity interpretation: <1.31 ft/sec, indicative of household ambulator General Gait Details: Pt demonstrates poor ability to sequence how to weight shift and then advance legs, thus required verbal step-by-step commands along with tactile cues to shift weight to either side and take step with contralateral leg. Assistance provided at L leg to extend knee with stance and advance leg with swing.   Stairs  Wheelchair Mobility    Modified Rankin (Stroke Patients Only) Modified Rankin (Stroke Patients Only) Pre-Morbid Rankin Score: No symptoms Modified Rankin: Moderately severe disability     Balance Overall balance assessment: Needs assistance Sitting-balance support: Feet supported;Bilateral upper extremity supported;Single extremity supported Sitting balance-Leahy Scale: Poor Sitting balance - Comments: Static sitting EOB with cues to lean anteriorly, progressing from modA to minA-min guard assist to maintain balance. Postural control: Posterior lean Standing balance support: Bilateral upper extremity supported Standing balance-Leahy Scale: Poor Standing balance comment: L knee block and mod-maxAx2 to maintain balance with standing functional mobility with UEs on RW.                            Cognition Arousal/Alertness: Awake/alert Behavior During Therapy: Flat affect;Impulsive Overall Cognitive Status: Impaired/Different from baseline Area of Impairment: Attention;Following commands;Safety/judgement;Problem solving;Memory;Awareness                   Current Attention Level: Selective Memory: Decreased recall of precautions;Decreased short-term memory Following Commands: Follows one step commands inconsistently;Follows one step commands with increased time Safety/Judgement: Decreased awareness of safety;Decreased awareness of deficits Awareness: Intellectual Problem Solving: Slow processing;Decreased initiation;Difficulty sequencing;Requires verbal cues;Requires tactile cues General Comments: Pt impulsive with movements at times. Required continual monitoring to ensure pt did not pull at trach or leads, with success. Required increased time and repeated cues to have pt follow commands, with success ~70% of time. Increased time required to process and initiate tasks with max cues.       Exercises      General Comments General comments (skin  integrity, edema, etc.): 5L 28%FiO2 with SpO2 >/= 98% and HR as high as 120s during functional mob      Pertinent Vitals/Pain Pain Assessment: Faces Faces Pain Scale: No hurt Pain Intervention(s): Limited activity within patient's tolerance;Monitored during session;Repositioned    Home Living                      Prior Function            PT Goals (current goals can now be found in the care plan section) Acute Rehab PT Goals Patient Stated Goal: to get OOB to recliner PT Goal Formulation: With patient Time For Goal Achievement: 02/29/20 Potential to Achieve Goals: Fair Progress towards PT goals: Progressing toward goals    Frequency    Min 4X/week      PT Plan Current plan remains appropriate    Co-evaluation              AM-PAC PT "6 Clicks" Mobility   Outcome Measure  Help needed turning from your back to your side while in a flat bed without using bedrails?: A Little Help needed moving from lying on your back to sitting on the side of a flat bed without using bedrails?: A Lot Help needed moving to and from a bed to a chair (including a wheelchair)?: A Lot Help needed standing up from a chair using your arms (e.g., wheelchair or bedside chair)?: A Lot Help needed to walk in hospital room?: A Lot Help needed climbing 3-5 steps with a railing? : Total 6 Click Score: 12    End of Session Equipment Utilized During Treatment: Gait belt;Oxygen Activity Tolerance: Patient tolerated treatment well Patient left: with call bell/phone within reach;with restraints reapplied;in chair;with chair alarm set Nurse Communication: Mobility status PT Visit Diagnosis: Hemiplegia and hemiparesis;Other symptoms and signs involving the nervous  system (R29.898);Other abnormalities of gait and mobility (R26.89);Unsteadiness on feet (R26.81);Muscle weakness (generalized) (M62.81);Difficulty in walking, not elsewhere classified (R26.2) Hemiplegia - Right/Left: Left Hemiplegia  - dominant/non-dominant: Non-dominant Hemiplegia - caused by: Other Nontraumatic intracranial hemorrhage     Time: 1300-1333 PT Time Calculation (min) (ACUTE ONLY): 33 min  Charges:  $Therapeutic Activity: 23-37 mins                     Moishe Spice, PT, DPT Acute Rehabilitation Services  Pager: [phone] Office: [place] 02/25/2020, 3:31 PM

## 2020-02-25 NOTE — Progress Notes (Signed)
  NEUROSURGERY PROGRESS NOTE   No issues overnight.  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 female s/p clipping L PICA aneurysm. Neurologically stable with primarily lower cranial neuropathy, multiple intubation/extubation and tracheostomy requiring concurrent thyroidectomy. S/P G tube. - CIR candidate. Dispo planning.

## 2020-02-25 NOTE — Plan of Care (Signed)
  Problem: Education: Goal: Knowledge of disease or condition will improve Outcome: Not Progressing Goal: Knowledge of secondary prevention will improve Outcome: Not Progressing   Problem: Health Behavior/Discharge Planning: Goal: Ability to manage health-related needs will improve Outcome: Not Progressing   Problem: Education: Goal: Knowledge of General Education information will improve Description: Including pain rating scale, medication(s)/side effects and non-pharmacologic comfort measures Outcome: Not Progressing   Problem: Elimination: Goal: Will not experience complications related to bowel motility Outcome: Not Progressing

## 2020-02-25 NOTE — Progress Notes (Addendum)
PROGRESS NOTE    Stacy Moran  TIW:[id number] DOB: March 10, 1956 DOA: 01/30/2020 PCP: No primary care provider on file.   Brief Narrative:  64 y.o. 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   She had a tracheostomy and total thyroidectomy on 11/12; she has self-decanulated twice and is now in soft wrist restraints with a bedside sitter.   She has been recommended for G-tube placement.  She is awake and appears to be able to answer yes/no questions by head movements but is unable to talk.  She denies pain.  Appears to have cognitive function.Had aneurysm with clipping, prolonged intubation with enlarged thyroid.  She had thyroidectomy in order for trach to be placed.  She has been off the vent for a week and stable.  Needs routine trach care - Dr. Constance Holster is following.  Likely needs PEG - IR consult request placed.  Has sputum culture pending; on MBS she appears to have subglottal obstruction and so CT ordered and started on Cefepime.  Has developed HTN, DM while hospitalized.   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  We will closely monitor.   Tracheostomy with mild bleeding-controlled for now -Failed extubation now has trach.  Supportive care with concerns of infection therefore on cefepime.  Closely monitor bleeding and hemoglobin. -Scopolamine added for upper  respiratory secretion at trach site Speech therapy brought up concerns about her upper airway, therefore consulted ENT again.    Essential hypertension, labile -Suspect some autonomic dysfunction therefore I advised nursing staff to give him 5 cc of normal saline bolus if her systolic drops below 250.  In the meantime hold off on home blood pressure medications.  If it starts getting over 539 systolic, we can slowly start reintroducing blood pressure medications. -Discontinued losartan and labetalol.  Norvasc will only be given with systolic greater than 767.   Prediabetes -No known medical problems PTA -A1c on 11/3 was 6.2 -Sliding scale -Levemir 12 units discontinued until her feeding has been restarted.   Dysphagia -Failed swallow evaluation -G-tube placement placed 11/26.  Feeds appear to be at goal.  Hopefully can transition to CIR.  Hypokalemia -Repletion ordered   Thyroidectomy -Status post total thyroidectomy.  Follow-up with ENT -Daily Synthroid    Body mass index is 22.4 kg/m.  Pressure Injury 02/12/20 Throat Mid Stage 2 -  Partial thickness loss of dermis presenting as a shallow open injury with a red, pink wound bed without slough. (Active)  02/12/20 1500  Location: Throat  Location Orientation: Mid  Staging: Stage 2 -  Partial thickness loss of dermis presenting as a shallow open injury with a red, pink wound bed without slough.  Wound Description (Comments):   Present on Admission: No      Subjective: Feels okay no complaints  Examination: Constitutional: Chronically ill-appearing, tracheostomy in place.  Attempting to speak but having difficult time Respiratory: Anterior rhonchi Cardiovascular: Normal sinus rhythm, no rubs Abdomen:  Nontender nondistended good bowel sounds Musculoskeletal: No edema noted Skin: No rashes seen Neurologic: Difficult to assess Psychiatric: Chronically ill-appearing difficult to assess  Objective: Vitals:   02/24/20  2311 02/25/20 0326 02/25/20 0500 02/25/20 0734  BP: (!) 152/81 (!) 146/96  (!) 151/81  Pulse: 89 94  91  Resp:    17  Temp: 97.8 F (36.6 C) 98.2 F (36.8 C)  98.3 F (36.8 C)  TempSrc: Oral Oral  Axillary  SpO2: 99% 98%    Weight:   68.8 kg   Height:        Intake/Output Summary (Last 24 hours) at 02/25/2020 1114 Last data filed at 02/25/2020 0300 Gross per 24 hour  Intake 20 ml  Output 450 ml  Net -430 ml   Filed Weights   02/19/20 0500 02/20/20 0425 02/25/20 0500  Weight: 66.8 kg 65.1 kg 68.8 kg     Data Reviewed:   CBC: Recent Labs  Lab 02/20/20 0334 02/22/20 0402 02/23/20 0300 02/24/20 0033 02/25/20 0501  WBC 7.4 3.7* 5.0 2.8* 2.8*  NEUTROABS 5.9  --   --   --   --   HGB 10.4* 9.7* 10.2* 8.5* 8.4*  HCT 33.6* 31.8* 33.5* 27.9* 27.4*  MCV 83.8 84.8 84.6 85.1 86.2  PLT 275 317 292 PLATELET CLUMPS NOTED ON SMEAR, UNABLE TO ESTIMATE 790   Basic Metabolic Panel: Recent Labs  Lab 02/20/20 0334 02/22/20 0402 02/23/20 0300 02/24/20 0033 02/25/20 0501  NA 140 144 142 141 139  K 3.3* 3.5 3.1* 3.7 3.7  CL 104 108 106 109 108  CO2 22 20* 24 21* 22  GLUCOSE 99 93 157* 120* 95  BUN 9 8 <5* 11 12  CREATININE 0.66 0.68 0.62 0.58 0.49  CALCIUM 8.6* 8.5* 8.5* 8.0* 7.8*  MG  --  2.3 2.1 2.0 2.0   GFR: Estimated Creatinine Clearance: 76.2 mL/min (by C-G formula based on SCr of 0.49 mg/dL). Liver Function Tests: Recent Labs  Lab 02/20/20 0334  AST 26  ALT 39  ALKPHOS 76  BILITOT 1.5*  PROT 6.1*  ALBUMIN 2.6*   No results for input(s): LIPASE, AMYLASE in the last 168 hours. No results for input(s): AMMONIA in the last 168 hours. Coagulation Profile: Recent Labs  Lab 02/22/20 0446  INR 1.1   Cardiac Enzymes: No results for input(s): CKTOTAL, CKMB, CKMBINDEX, TROPONINI in the last 168 hours. BNP (last 3 results) No results for input(s): PROBNP in the last 8760 hours. HbA1C: No results for input(s): HGBA1C in the last 72 hours. CBG: Recent Labs   Lab 02/24/20 1645 02/24/20 1949 02/24/20 2315 02/25/20 0323 02/25/20 0731  GLUCAP 119* 113* 139* 70 140*   Lipid Profile: No results for input(s): CHOL, HDL, LDLCALC, TRIG, CHOLHDL, LDLDIRECT in the last 72 hours. Thyroid Function Tests: No results for input(s): TSH, T4TOTAL, FREET4, T3FREE, THYROIDAB in the last 72 hours. Anemia Panel: No results for input(s): VITAMINB12, FOLATE, FERRITIN, TIBC, IRON, RETICCTPCT in the last 72 hours. Sepsis Labs: No results for input(s): PROCALCITON, LATICACIDVEN in the last 168 hours.  Recent Results (from the past 240 hour(s))  Culture, respiratory (non-expectorated)     Status: None   Collection Time: 02/16/20  4:02 PM   Specimen: Tracheal Aspirate; Respiratory  Result Value Ref Range Status   Specimen Description TRACHEAL ASPIRATE  Final   Special Requests NONE  Final   Gram Stain   Final    ABUNDANT WBC PRESENT, PREDOMINANTLY PMN ABUNDANT GRAM NEGATIVE RODS MODERATE GRAM POSITIVE COCCI  IN PAIRS FEW GRAM POSITIVE RODS    Culture   Final    Consistent with normal respiratory flora. No Pseudomonas species isolated Performed at [facility] [address] [place], [place] [address]    Report Status 02/18/2020 FINAL  Final  Aerobic Culture (superficial specimen)     Status: None   Collection Time: 02/18/20  9:43 PM   Specimen: Wound  Result Value Ref Range Status   Specimen Description WOUND NECK  Final   Special Requests NONE  Final   Gram Stain   Final    MODERATE WBC PRESENT,BOTH PMN AND MONONUCLEAR ABUNDANT GRAM POSITIVE COCCI ABUNDANT GRAM NEGATIVE RODS    Culture   Final    FEW MULTIPLE ORGANISMS PRESENT, NONE PREDOMINANT NO STAPHYLOCOCCUS AUREUS ISOLATED NO GROUP A STREP (S.PYOGENES) ISOLATED Performed at [facility], [place] [address], [place], [place] [address]    Report Status 02/20/2020 FINAL  Final         Radiology Studies: CT HEAD WO CONTRAST  Result Date: 02/25/2020 CLINICAL DATA:   Subdural hematoma follow-up EXAM: CT HEAD WITHOUT CONTRAST TECHNIQUE: Contiguous axial images were obtained from the base of the skull through the vertex without intravenous contrast. COMPARISON:  02/23/2020 FINDINGS: Brain: Unchanged size of right holo hemispheric subdural hematoma with minimal leftward midline shift. No acute hemorrhage. Size and configuration of the ventricles are unchanged. Unchanged small vessel infarcts of the right basal ganglia. Vascular: No hyperdense vessel or unexpected calcification. Skull: Remote left suboccipital craniotomy. Right frontal burr hole. Sinuses/Orbits: Negative Other: None IMPRESSION: Unchanged size of right holo hemispheric subdural hematoma with minimal leftward midline shift. Electronically Signed   By: Ulyses Jarred M.D.   On: 02/25/2020 01:53   CT HEAD WO CONTRAST  Addendum Date: 02/23/2020   ADDENDUM REPORT: 02/23/2020 22:07 ADDENDUM: Upon further evaluation, no definite left frontal 4 mm extra-axial fluid collection. Electronically Signed   By: Iven Finn M.D.   On: 02/23/2020 22:07   Result Date: 02/23/2020 CLINICAL DATA:  Mental status change unknown. The period of unresponsiveness. EXAM: CT HEAD WITHOUT CONTRAST TECHNIQUE: Contiguous axial images were obtained from the base of the skull through the vertex without intravenous contrast. COMPARISON:  CT head 02/15/2020, CT neck 02/20/2020. FINDINGS: Brain: Patchy and confluent areas of decreased attenuation are noted throughout the deep and periventricular white matter of the cerebral hemispheres bilaterally, compatible with chronic microvascular ischemic disease. No evidence of large-territorial acute infarction. No parenchymal hemorrhage. No mass lesion. Redemonstration of a right calvarial convexity fluid collection now appearing homogeneous and slightly more hypodense compared to CT head 02/15/2020 (hypodense compared to the gray cortex but higher density than cerebrospinal fluid). This extra fluid  collection corresponds to a subacute to chronic right subdural hematoma and measures stable in size up to 1.2 cm (from an acute subdural hemorrhage measuring up to 7 mm on 03/16/2020). Suggestion of a similar-appearing subacute acute left frontal extra-axial fluid collection measuring up to 4 mm. Interval resolution of previously noted trace subarachnoid hemorrhage. The Similar-appearing partial effacement of the right cerebrum sulci. Stable 3 mm right to left midline shift. The basilar cisterns are patent. No hydrocephalus. No intraventricular hemorrhage. Vascular: No hyperdense vessel. Aneurysmal clip noted along the foramen magnum. Skull: Prior left cerebellar calvarial craniotomy with suture fasteners. No acute fracture or focal lesion. Sinuses/Orbits: Paranasal sinuses and mastoid air cells are clear. The orbits are unremarkable. Other: Skin staples overlie the left posterior scalp. IMPRESSION: 1. Stable now subacute to chronic  1.2 cm right subdural hematoma with stable 3 mm right to left midline shift. 2. Interval resolution of previously noted trace subarachnoid hemorrhage. 3. Suggestion of a subacute to chronic stable left frontal 4 mm extra-axial fluid collection. Electronically Signed: By: Iven Finn M.D. On: 02/23/2020 21:46        Scheduled Meds: . amLODipine  10 mg Per Tube Daily  . chlorhexidine gluconate (MEDLINE KIT)  15 mL Mouth Rinse BID  . Chlorhexidine Gluconate Cloth  6 each Topical Daily  . docusate  100 mg Per Tube BID  . feeding supplement (PROSource TF)  45 mL Per Tube BID  . free water  300 mL Per Tube Q4H  . heparin  5,000 Units Subcutaneous Q8H  . hydrALAZINE  25 mg Per Tube Q8H  . insulin aspart  0-20 Units Subcutaneous Q4H  . levothyroxine  100 mcg Per Tube Q0600  . mouth rinse  15 mL Mouth Rinse 10 times per day  . metoCLOPramide  5 mg Per Tube Q6H  . neomycin-bacitracin-polymyxin  1 application Topical Daily  . pantoprazole sodium  40 mg Per Tube Daily  .  polyethylene glycol  17 g Per Tube Daily  . polyvinyl alcohol  1 drop Left Eye BID  . scopolamine  1 patch Transdermal Q72H  . sodium chloride flush  10-40 mL Intracatheter Q12H   Continuous Infusions: . 0.9 % NaCl with KCl 20 mEq / L 100 mL/hr at 02/25/20 0544  .  ceFAZolin (ANCEF) IV    . ceFEPime (MAXIPIME) IV 2 g (02/25/20 0923)  . feeding supplement (OSMOLITE 1.2 CAL) 1,000 mL (02/24/20 1249)     LOS: 26 days   Time spent= 20 mins    Tanylah Schnoebelen Arsenio Loader, MD [facility]  If 7PM-7AM, please contact night-coverage  02/25/2020, 11:14 AM

## 2020-02-26 LAB — BASIC METABOLIC PANEL
Anion gap: 8 (ref 5–15)
BUN: 6 mg/dL — ABNORMAL LOW (ref 8–23)
CO2: 26 mmol/L (ref 22–32)
Calcium: 8.1 mg/dL — ABNORMAL LOW (ref 8.9–10.3)
Chloride: 102 mmol/L (ref 98–111)
Creatinine, Ser: 0.54 mg/dL (ref 0.44–1.00)
GFR, Estimated: >60 mL/min (ref >60)
Glucose, Bld: 130 mg/dL — ABNORMAL HIGH (ref 70–99)
Potassium: 3.1 mmol/L — ABNORMAL LOW (ref 3.5–5.1)
Sodium: 136 mmol/L (ref 135–145)

## 2020-02-26 LAB — MAGNESIUM: Magnesium: 1.9 mg/dL (ref 1.7–2.4)

## 2020-02-26 LAB — GLUCOSE, CAPILLARY
Glucose-Capillary: 149 mg/dL — ABNORMAL HIGH (ref 70–99)
Glucose-Capillary: 92 mg/dL (ref 70–99)
Glucose-Capillary: 101 mg/dL — ABNORMAL HIGH (ref 70–99)
Glucose-Capillary: 126 mg/dL — ABNORMAL HIGH (ref 70–99)
Glucose-Capillary: 126 mg/dL — ABNORMAL HIGH (ref 70–99)
Glucose-Capillary: 93 mg/dL (ref 70–99)

## 2020-02-26 LAB — CBC
HCT: 30.6 % — ABNORMAL LOW (ref 36.0–46.0)
Hemoglobin: 9.4 g/dL — ABNORMAL LOW (ref 12.0–15.0)
MCH: 26 pg (ref 26.0–34.0)
MCHC: 30.7 g/dL (ref 30.0–36.0)
MCV: 84.8 fL (ref 80.0–100.0)
Platelets: 257 10*3/uL (ref 150–400)
RBC: 3.61 MIL/uL — ABNORMAL LOW (ref 3.87–5.11)
RDW: 18.5 % — ABNORMAL HIGH (ref 11.5–15.5)
WBC: 3.9 10*3/uL — ABNORMAL LOW (ref 4.0–10.5)
nRBC: 0.8 % — ABNORMAL HIGH (ref 0.0–0.2)

## 2020-02-26 MED ORDER — SCOPOLAMINE 1 MG/3DAYS TD PT72: 1.0000 | MEDICATED_PATCH | TRANSDERMAL | Status: DC

## 2020-02-26 MED ORDER — GLYCOPYRROLATE 1 MG PO TABS
1.0000 mg | ORAL_TABLET | Freq: Three times a day (TID) | ORAL | Status: DC
  Administered 2020-02-26 – 2020-03-02 (×15): 1 mg
  Filled 2020-02-26 (×15): qty 1

## 2020-02-26 MED ORDER — POTASSIUM CHLORIDE 20 MEQ PO PACK
40.0000 meq | PACK | ORAL | Status: AC
  Administered 2020-02-26 (×2): 40 meq
  Filled 2020-02-26 (×2): qty 2

## 2020-02-26 MED ORDER — SODIUM CHLORIDE 0.9 % IV SOLN: INTRAVENOUS | Status: AC

## 2020-02-26 MED ORDER — LABETALOL HCL 200 MG PO TABS
300.0000 mg | ORAL_TABLET | Freq: Three times a day (TID) | ORAL | Status: DC
  Administered 2020-02-26 – 2020-02-27 (×5): 300 mg via ORAL
  Filled 2020-02-26 (×5): qty 1

## 2020-02-26 NOTE — Progress Notes (Addendum)
PROGRESS NOTE    Stacy Moran  EUM:[id number] DOB: Aug 12, 1955 DOA: 01/30/2020 PCP: No primary care provider on file.   Brief Narrative:  64 y.o. 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   She had a tracheostomy and total thyroidectomy on 11/12; she has self-decanulated twice and is now in soft wrist restraints with a bedside sitter.   She has been recommended for G-tube placement.  She is awake and appears to be able to answer yes/no questions by head movements but is unable to talk.  She denies pain.  Appears to have cognitive function.Had aneurysm with clipping, prolonged intubation with enlarged thyroid.  She had thyroidectomy in order for trach to be placed.  She has been off the vent for a week and stable.  Needs routine trach care - Dr. Constance Holster is following.  Likely needs PEG - IR consult request placed.  Has sputum culture pending; on MBS she appears to have subglottal obstruction and so CT ordered and started on Cefepime.  Has developed HTN, DM while hospitalized.   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  Intermittent delirium   Tracheostomy with mild bleeding-controlled for now -Failed extubation now has trach.  Having lots of secretions.  Scopolamine patch started.  ENT performed flexible fiberoptic which shows excessive upper respiratory secretion but otherwise trachea looks  healthy.  No evidence of infection. -Spoke with pulmonary who requested to touch base with ENT as this procedure was performed by their service.  We will have to defer to ENT in terms of changing trach to a cuffed to help with aspiration -I will also have Robinul to help with the secretions   Essential hypertension, labile -Norvasc and labetalol restarted.  Slowly resume losartan as BP permits   Prediabetes -No known medical problems PTA -A1c on 11/3 was 6.2 -Sliding scale -Slowly resume her Levemir as her blood glucose improves with tube feeds   Dysphagia -Failed swallow evaluation -G-tube placement placed 11/26.  Tube feeds started    Thyroidectomy -Status post total thyroidectomy.  Seen by ENT appreciate input -Daily Synthroid    Body mass index is 22.4 kg/m.  Pressure Injury 02/12/20 Throat Mid Stage 2 -  Partial thickness loss of dermis presenting as a shallow open injury with a red, pink wound bed without slough. (Active)  02/12/20 1500  Location: Throat  Location Orientation: Mid  Staging: Stage 2 -  Partial thickness loss of dermis presenting as a shallow open injury with a red, pink wound bed without slough.  Wound Description (Comments):   Present on Admission: No      Subjective:  No complaint still having increased amount of secretions from her tracheostomy site  Examination: Constitutional: Appears chronically ill, trach site noted with copious amount of secretion Respiratory: Minimal rhonchi bilateral bases Cardiovascular: Normal sinus rhythm, no rubs Abdomen: Nontender nondistended good bowel sounds Musculoskeletal: No edema noted Skin: No rashes seen Neurologic: No new focal neuro deficits  noted Psychiatric: Poor judgment, alert to name only.  Objective: Vitals:   02/26/20 0000 02/26/20 0328 02/26/20 0335 02/26/20 0840  BP:  (!) 136/56 (!) 172/93 (!) 160/141  Pulse:  96 87 (!) 108  Resp:  _0 Temp: 98.6 F (37 C) 99 F (37.2 C) 99 F  (37.2 C)   TempSrc: Axillary Axillary Axillary   SpO2:  100% 100% 100%  Weight:      Height:       No intake or output data in the 24 hours ending 02/26/20 1101 Filed Weights   02/19/20 0500 02/20/20 0425 02/25/20 0500  Weight: 66.8 kg 65.1 kg 68.8 kg     Data Reviewed:   CBC: Recent Labs  Lab 02/20/20 0334 02/20/20 0334 02/22/20 0402 02/23/20 0300 02/24/20 0033 02/25/20 0501 02/26/20 0652  WBC 7.4   < > 3.7* 5.0 2.8* 2.8* 3.9*  NEUTROABS 5.9  --   --   --   --   --   --   HGB 10.4*   < > 9.7* 10.2* 8.5* 8.4* 9.4*  HCT 33.6*   < > 31.8* 33.5* 27.9* 27.4* 30.6*  MCV 83.8   < > 84.8 84.6 85.1 86.2 84.8  PLT 275   < > 317 292 PLATELET CLUMPS NOTED ON SMEAR, UNABLE TO ESTIMATE 205 257   < > = values in this interval not displayed.   Basic Metabolic Panel: Recent Labs  Lab 02/22/20 0402 02/23/20 0300 02/24/20 0033 02/25/20 0501 02/26/20 0652  NA 144 142 141 139 136  K 3.5 3.1* 3.7 3.7 3.1*  CL 108 106 109 108 102  CO2 20* 24 21* 22 26  GLUCOSE 93 157* 120* 95 130*  BUN 8 <5* 11 12 6*  CREATININE 0.68 0.62 0.58 0.49 0.54  CALCIUM 8.5* 8.5* 8.0* 7.8* 8.1*  MG 2.3 2.1 2.0 2.0 1.9   GFR: Estimated Creatinine Clearance: 76.2 mL/min (by C-G formula based on SCr of 0.54 mg/dL). Liver Function Tests: Recent Labs  Lab 02/20/20 0334  AST 26  ALT 39  ALKPHOS 76  BILITOT 1.5*  PROT 6.1*  ALBUMIN 2.6*   No results for input(s): LIPASE, AMYLASE in the last 168 hours. No results for input(s): AMMONIA in the last 168 hours. Coagulation Profile: Recent Labs  Lab 02/22/20 0446  INR 1.1   Cardiac Enzymes: No results for input(s): CKTOTAL, CKMB, CKMBINDEX, TROPONINI in the last 168 hours. BNP (last 3 results) No results for input(s): PROBNP in the last 8760 hours. HbA1C: No results for input(s): HGBA1C in the last 72 hours. CBG: Recent Labs  Lab 02/25/20 1602 02/25/20 1921 02/25/20 2312 02/26/20 0338 02/26/20 0854  GLUCAP 134* 109* 119* 149* 92   Lipid  Profile: No results for input(s): CHOL, HDL, LDLCALC, TRIG, CHOLHDL, LDLDIRECT in the last 72 hours. Thyroid Function Tests: No results for input(s): TSH, T4TOTAL, FREET4, T3FREE, THYROIDAB in the last 72 hours. Anemia Panel: No results for input(s): VITAMINB12, FOLATE, FERRITIN, TIBC, IRON, RETICCTPCT in the last 72 hours. Sepsis Labs: No results for input(s): PROCALCITON, LATICACIDVEN in the last 168 hours.  Recent Results (from the past 240 hour(s))  Culture, respiratory (non-expectorated)     Status: None   Collection Time: 02/16/20  4:02 PM   Specimen: Tracheal Aspirate; Respiratory  Result Value Ref Range Status   Specimen Description TRACHEAL ASPIRATE  Final   Special Requests NONE  Final   Gram Stain   Final    ABUNDANT WBC PRESENT, PREDOMINANTLY PMN  ABUNDANT GRAM NEGATIVE RODS MODERATE GRAM POSITIVE COCCI IN PAIRS FEW GRAM POSITIVE RODS    Culture   Final    Consistent with normal respiratory flora. No Pseudomonas species isolated Performed at [facility] [address] [place], [place] [address]    Report Status 02/18/2020 FINAL  Final  Aerobic Culture (superficial specimen)     Status: None   Collection Time: 02/18/20  9:43 PM   Specimen: Wound  Result Value Ref Range Status   Specimen Description WOUND NECK  Final   Special Requests NONE  Final   Gram Stain   Final    MODERATE WBC PRESENT,BOTH PMN AND MONONUCLEAR ABUNDANT GRAM POSITIVE COCCI ABUNDANT GRAM NEGATIVE RODS    Culture   Final    FEW MULTIPLE ORGANISMS PRESENT, NONE PREDOMINANT NO STAPHYLOCOCCUS AUREUS ISOLATED NO GROUP A STREP (S.PYOGENES) ISOLATED Performed at [facility], [place] [address], [place], [place] [address]    Report Status 02/20/2020 FINAL  Final         Radiology Studies: CT HEAD WO CONTRAST  Result Date: 02/25/2020 CLINICAL DATA:  Subdural hematoma follow-up EXAM: CT HEAD WITHOUT CONTRAST TECHNIQUE: Contiguous axial images were obtained from the base of the  skull through the vertex without intravenous contrast. COMPARISON:  02/23/2020 FINDINGS: Brain: Unchanged size of right holo hemispheric subdural hematoma with minimal leftward midline shift. No acute hemorrhage. Size and configuration of the ventricles are unchanged. Unchanged small vessel infarcts of the right basal ganglia. Vascular: No hyperdense vessel or unexpected calcification. Skull: Remote left suboccipital craniotomy. Right frontal burr hole. Sinuses/Orbits: Negative Other: None IMPRESSION: Unchanged size of right holo hemispheric subdural hematoma with minimal leftward midline shift. Electronically Signed   By: Ulyses Jarred M.D.   On: 02/25/2020 01:53        Scheduled Meds: . amLODipine  10 mg Per Tube Daily  . chlorhexidine gluconate (MEDLINE KIT)  15 mL Mouth Rinse BID  . Chlorhexidine Gluconate Cloth  6 each Topical Daily  . docusate  100 mg Per Tube BID  . feeding supplement (PROSource TF)  45 mL Per Tube BID  . heparin  5,000 Units Subcutaneous Q8H  . hydrALAZINE  25 mg Per Tube Q8H  . insulin aspart  0-20 Units Subcutaneous Q4H  . levothyroxine  100 mcg Per Tube Q0600  . mouth rinse  15 mL Mouth Rinse 10 times per day  . metoCLOPramide  5 mg Per Tube Q6H  . neomycin-bacitracin-polymyxin  1 application Topical Daily  . pantoprazole sodium  40 mg Per Tube Daily  . polyethylene glycol  17 g Per Tube Daily  . polyvinyl alcohol  1 drop Left Eye BID  . potassium chloride  40 mEq Per Tube Q4H  . scopolamine  1 patch Transdermal Q72H  . scopolamine  1 patch Transdermal Q72H  . sodium chloride flush  10-40 mL Intracatheter Q12H   Continuous Infusions: . sodium chloride 75 mL/hr at 02/26/20 1018  .  ceFAZolin (ANCEF) IV    . feeding supplement (OSMOLITE 1.2 CAL) 1,000 mL (02/25/20 1516)     LOS: 27 days   Time spent= 20 mins    Macai Sisneros Arsenio Loader, MD [facility]  If 7PM-7AM, please contact night-coverage  02/26/2020, 11:01 AM

## 2020-02-26 NOTE — Plan of Care (Signed)
  Problem: Education: Goal: Knowledge of disease or condition will improve Outcome: Not Progressing   Problem: Coping: Goal: Will verbalize positive feelings about self Outcome: Not Progressing Goal: Will identify appropriate support needs Outcome: Not Progressing

## 2020-02-26 NOTE — Progress Notes (Signed)
  NEUROSURGERY PROGRESS NOTE   No issues overnight. Patient eager to go home.  EXAM:  BP (!) 172/93 (BP Location: Right Arm)   Pulse 87   Temp 99 F (37.2 C) (Axillary)   Resp 18   Ht 5\' 9"  (1.753 m)   Wt 68.8 kg   SpO2 100%   BMI 22.40 kg/m   Awake, alert Mouths responses, answers questions appropriately MAE good strength Wound c/d/i   IMPRESSION/PLAN 64 y.o. female s/p clipping L PICA aneurysm. Neurologically stable, multiple intubation/extubation and tracheostomy requiring concurrent thyroidectomy. S/P G tube. - CIR candidate. Dispo planning.

## 2020-02-26 NOTE — Progress Notes (Signed)
Inpatient Rehab Admissions Coordinator:   Note ongoing increase in secretions.  Per acute case manager, plan for pulmonary to see pt today for recommendations.  Will continue to follow.   Shann Medal, PT, DPT Admissions Coordinator [phone] 02/26/20  10:25 AM

## 2020-02-26 NOTE — Consult Note (Signed)
Reconsulted to evaluate tracheostomy for possible infection.  She is unable to handle secretions.  She is unable to phonate.  Her neurologic status is up and down.  On exam, she is sitting up.  She is cooperative.  By occluding the tracheostomy she is able to speak slightly in a very faint whisper.  She has no true glottic phonation.  She has a lot of clear mucus secretions from the tracheostomy.  Flexible fiberoptic examination was performed through the nose after topical anesthetic application.  Is a large amount of noninfected appearing secretions in the pharynx and coating the larynx.  She seems to have very poor sensation throughout the larynx and hypopharynx.  Due to the excessive secretions I was unable to visualize the cords well enough to evaluate the airway and also to evaluate vocal cord mobility.  The scope was then passed through the tracheostomy to examine the trachea down to the carina and that was all clear and healthy.  Based on today's assessment I feel that she has very poor sensation in the larynx and hypopharynx.  She is very high risk for aspiration.  I cannot evaluate vocal cord mobility.  If she displays additional neurologic recovery in the future I can reevaluate and see if I can ascertain mobility of the cords.  For now I would recommend continued speech and swallowing therapy but realistically she probably needs to be n.p.o. and relying on gastrostomy tube feeds.  There is no indication of infection.  She may need a cuffed tracheostomy tube to protect her airway if there is concern about aspiration or pneumonia.

## 2020-02-26 NOTE — Progress Notes (Signed)
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  11/4, pt s/p L far-lateral craniotomy for clipping of PICA aneurysm. 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   OT comments  Pt noted to have staples still in place from neurosurgery. RN / MD notified to staples. Ot recommendation for CIR at this time. Pt total +2 min (A) with RW and max cues to L side during session. Pt needed cues for RW placement and L hand on RW. Pt tolerated >20 minutes standing during session. Pt progression out of room limited by incontinence. Pt would like to have someone help with shaving chin in next session.'   Follow Up Recommendations  CIR    Equipment Recommendations  Wheelchair (measurements OT);Wheelchair cushion (measurements OT);Hospital bed    Recommendations for Other Services Rehab consult    Precautions / Restrictions Precautions Precautions: Fall Precaution Comments: trach, wrist restraints       Mobility Bed Mobility Overal bed mobility: Needs Assistance Bed Mobility: Supine to Sit     Supine to sit: Min assist     General bed mobility comments: able to roll to eob and (A) to pull up into sitting on PT   Transfers Overall transfer level: Needs assistance   Transfers: Sit to/from Stand Sit to Stand: Min assist         General transfer comment: pt requires total +2 min (A) for hand placement L LE input     Balance Overall balance assessment: Needs assistance Sitting-balance support: Bilateral upper extremity supported;Feet supported Sitting balance-Leahy Scale: Fair     Standing balance support: Single extremity  supported;During functional activity Standing balance-Leahy Scale: Fair                             ADL either performed or assessed with clinical judgement   ADL Overall ADL's : Needs assistance/impaired Eating/Feeding: NPO Eating/Feeding Details (indicate cue type and reason): requesting food Grooming: Moderate assistance;Standing Grooming Details (indicate cue type and reason): pt only brushing R side of mouth and holding brush in mouth. pt never brushes L side of mouth. pt holding secretions in mouth but at one point does spit into the sink             Lower Body Dressing: Moderate assistance Lower Body Dressing Details (indicate cue type and reason): don socks x2 due to incontinence   Toilet Transfer Details (indicate cue type and reason): incontinence of bowel then again of bowel / urine           General ADL Comments: pt requires mod (A) for balance throughout session. pt with L inattention      Vision   Additional Comments: note to not blink on L eye with visual input from L peripheral. pt reports "no " deficits when asked about L eye   Perception     Praxis      Cognition Arousal/Alertness: Awake/alert Behavior During Therapy: Impulsive Overall Cognitive Status: Impaired/Different from baseline Area of Impairment: Attention;Memory;Following commands;Safety/judgement;Awareness;Problem solving                   Current Attention Level: Selective Memory: Decreased recall of  precautions;Decreased short-term memory Following Commands: Follows one step commands with increased time;Follows multi-step commands with increased time;Follows multi-step commands inconsistently Safety/Judgement: Decreased awareness of safety;Decreased awareness of deficits Awareness: Intellectual Problem Solving: Slow processing;Difficulty sequencing General Comments: pt impulsive at times with movement. pt with x2 incontinence events without awareness in the moment. pt  writes "love to see boys" when asked what she like to do. Pt perseverating on shaving face when seeing self in mirror for the first time.         Exercises     Shoulder Instructions       General Comments 5L 28% Fio2 trach     Pertinent Vitals/ Pain       Pain Assessment: No/denies pain  Home Living                                          Prior Functioning/Environment              Frequency  Min 2X/week        Progress Toward Goals  OT Goals(current goals can now be found in the care plan section)  Progress towards OT goals: Progressing toward goals  Acute Rehab OT Goals Patient Stated Goal: to  see boys OT Goal Formulation: With patient Time For Goal Achievement: 03/07/20 Potential to Achieve Goals: Good ADL Goals Pt Will Perform Grooming: with min assist;sitting Pt Will Perform Upper Body Bathing: with min assist;sitting Pt/caregiver will Perform Home Exercise Program: Increased strength;Increased ROM;Both right and left upper extremity;With minimal assist;With written HEP provided Additional ADL Goal #1: Pt will follow multi-step commands with 75% accuracy and no more than min cues. Additional ADL Goal #2: Pt will perform bed mobility with modA as precursor to EOB/OOB ADL. Additional ADL Goal #3: Pt will maintain static balacne EOB >5 min with minA as precursor to ADL.  Plan Discharge plan remains appropriate;Frequency remains appropriate    Co-evaluation    PT/OT/SLP Co-Evaluation/Treatment: Yes Reason for Co-Treatment: For patient/therapist safety;To address functional/ADL transfers   OT goals addressed during session: ADL's and self-care;Proper use of Adaptive equipment and DME      AM-PAC OT "6 Clicks" Daily Activity     Outcome Measure   Help from another person eating meals?: A Lot Help from another person taking care of personal grooming?: A Lot Help from another person toileting, which includes using toliet, bedpan, or  urinal?: A Lot Help from another person bathing (including washing, rinsing, drying)?: A Lot Help from another person to put on and taking off regular upper body clothing?: A Lot Help from another person to put on and taking off regular lower body clothing?: A Lot 6 Click Score: 12    End of Session Equipment Utilized During Treatment: Gait belt;Oxygen  OT Visit Diagnosis: Unsteadiness on feet (R26.81);Muscle weakness (generalized) (M62.81);Other abnormalities of gait and mobility (R26.89) Hemiplegia - Right/Left: Left Hemiplegia - dominant/non-dominant: Non-Dominant Hemiplegia - caused by: Nontraumatic intracerebral hemorrhage   Activity Tolerance Patient tolerated treatment well   Patient Left in chair;with call bell/phone within reach;with chair alarm set;with restraints reapplied (respiratory therapist present)   Nurse Communication Mobility status;Precautions        Time: 0277-4128 OT Time Calculation (min): 49 min  Charges: OT General Charges $OT Visit: 1 Visit OT Treatments $Self Care/Home Management : 23-37 mins   Brynn, OTR/L  Acute Rehabilitation Services Pager: [phone] Office: [phone] .  Jeri Modena 02/26/2020, 1:23 PM

## 2020-02-26 NOTE — Progress Notes (Signed)
PT was not in restraints when RN came in for shift at 1900. Pt only had a safety sitter-unsure when restraints were removed on dayshift. RN order was obtained at 2313 for restraints when pt began to not follow commands and was pulling at trach, g tube, and telemetry wires. Will continue to monitor

## 2020-02-26 NOTE — Progress Notes (Signed)
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  11/4, pt s/p L far-lateral craniotomy for clipping of PICA aneurysm. 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    PT Comments    Treated pt in conjunction with OT to maximize safety and quality of session this date. Pt has shown significant progress with PT services and demonstrates improved tolerance to activity. The pt would greatly benefit from intensive therapy services in the CIR setting as she is motivated to participate and is able to withstand increased treatment times. Pt tolerated >20 minutes standing during session with minA and 1-2 UE support. She was able to come to stand and ambulate ~8 ft with minAx2 this date. She demonstrated improved L leg advancement with swing compared to previous sessions, but continues to display L side neglect resulting in cues for L hand placement on the RW. Pt progression out of room limited by incontinence bouts. She demonstrates ataxic movements and poor feet clearance with gait that place her at risk for falls. Will continue to follow acutely. Current recommendations remain appropriate.   Follow Up Recommendations  CIR;Supervision/Assistance - 24 hour     Equipment Recommendations  Wheelchair (measurements PT);Wheelchair cushion (measurements PT)    Recommendations for Other Services       Precautions / Restrictions Precautions Precautions: Fall Precaution Comments: trach, wrist restraints Restrictions Weight Bearing Restrictions: No    Mobility  Bed Mobility Overal bed mobility: Needs Assistance Bed Mobility: Supine to Sit      Supine to sit: Min assist;HOB elevated     General bed mobility comments: Use of 1 UE on bed rail and other UE in therapist's hand to pull trunk to ascension. VCs to manage legs off bed, with success.  Transfers Overall transfer level: Needs assistance   Transfers: Sit to/from Stand Sit to Stand: Min assist;+2 physical assistance         General transfer comment: MinAx2 for steadying and cuing pt repeatedly for proper placement of hands prior to transfer and to transition L hand onto RW.  Ambulation/Gait Ambulation/Gait assistance: Min assist;+2 physical assistance;+2 safety/equipment Gait Distance (Feet): 8 Feet Assistive device: Rolling walker (2 wheeled) Gait Pattern/deviations: Step-through pattern;Decreased stride length;Ataxic;Trunk flexed;Decreased weight shift to right;Decreased weight shift to left;Narrow base of support Gait velocity: decreased Gait velocity interpretation: <1.31 ft/sec, indicative of household ambulator General Gait Details: Pt displays ataxic gait pattern and narrow BOS with gait. Required minAx2 to steady pt, cue pt to manage RW and to shift weight, and to physically assist with managing RW esp during turns. Improved L leg advancement with gait this date.   Stairs             Wheelchair Mobility    Modified Rankin (Stroke Patients Only)       Balance Overall balance assessment: Needs assistance Sitting-balance support: Bilateral upper extremity supported;Feet supported Sitting balance-Leahy Scale: Fair Sitting balance - Comments: Static sitting EOB with UE support, min guard for safety.   Standing balance support: Single extremity supported;During functional activity;Bilateral upper extremity supported Standing balance-Leahy Scale: Fair Standing balance comment: Pt able to reach minimally off BOS for bried periods of time with 1 hand with minA for safety.  Cognition Arousal/Alertness:  Awake/alert Behavior During Therapy: Impulsive Overall Cognitive Status: Impaired/Different from baseline Area of Impairment: Attention;Memory;Following commands;Safety/judgement;Awareness;Problem solving                   Current Attention Level: Selective Memory: Decreased recall of precautions;Decreased short-term memory Following Commands: Follows one step commands with increased time;Follows multi-step commands with increased time;Follows multi-step commands inconsistently Safety/Judgement: Decreased awareness of safety;Decreased awareness of deficits Awareness: Intellectual Problem Solving: Slow processing;Difficulty sequencing General Comments: pt impulsive at times with movement. pt with x2 incontinence events without awareness in the moment. pt writes "love to see boys" when asked what she like to do. Pt perseverating on shaving face when seeing self in mirror for the first time. pt impulsive with ataxic movements and requires repeated single step commands to pay attention to task and safety.       Exercises      General Comments General comments (skin integrity, edema, etc.): 5L 28% FiO2 trach      Pertinent Vitals/Pain Pain Assessment: No/denies pain Pain Intervention(s): Monitored during session;Limited activity within patient's tolerance;Repositioned    Home Living                      Prior Function            PT Goals (current goals can now be found in the care plan section) Acute Rehab PT Goals Patient Stated Goal: to  see boys PT Goal Formulation: With patient Time For Goal Achievement: 02/29/20 Potential to Achieve Goals: Good Progress towards PT goals: Progressing toward goals    Frequency    Min 4X/week      PT Plan Current plan remains appropriate    Co-evaluation PT/OT/SLP Co-Evaluation/Treatment: Yes Reason for Co-Treatment: For patient/therapist safety;To address functional/ADL transfers PT goals addressed during session:  Mobility/safety with mobility;Balance;Proper use of DME OT goals addressed during session: ADL's and self-care;Proper use of Adaptive equipment and DME      AM-PAC PT "6 Clicks" Mobility   Outcome Measure  Help needed turning from your back to your side while in a flat bed without using bedrails?: A Little Help needed moving from lying on your back to sitting on the side of a flat bed without using bedrails?: A Lot Help needed moving to and from a bed to a chair (including a wheelchair)?: A Lot Help needed standing up from a chair using your arms (e.g., wheelchair or bedside chair)?: A Lot Help needed to walk in hospital room?: A Lot Help needed climbing 3-5 steps with a railing? : Total 6 Click Score: 12    End of Session Equipment Utilized During Treatment: Gait belt;Oxygen Activity Tolerance: Patient tolerated treatment well Patient left: in chair;with call bell/phone within reach;with chair alarm set;with restraints reapplied Nurse Communication: Mobility status;Other (comment) (incontinence bouts) PT Visit Diagnosis: Hemiplegia and hemiparesis;Other symptoms and signs involving the nervous system (R29.898);Other abnormalities of gait and mobility (R26.89);Unsteadiness on feet (R26.81);Muscle weakness (generalized) (M62.81);Difficulty in walking, not elsewhere classified (R26.2) Hemiplegia - Right/Left: Left Hemiplegia - dominant/non-dominant: Non-dominant Hemiplegia - caused by: Other Nontraumatic intracranial hemorrhage     Time: 1133-1216 PT Time Calculation (min) (ACUTE ONLY): 43 min  Charges:  $Gait Training: 8-22 mins                     Moishe Spice, PT, DPT Acute Rehabilitation Services  Pager: [phone] Office: [place] 02/26/2020, 1:49 PM

## 2020-02-26 NOTE — Progress Notes (Signed)
Notified by RN that pt is continuing to pull at her G tube and trach. Pt is confused. Sitter order expires in 30 minutes and restraint order expired. Renewed restraints and 1:1 sitter for patient safety

## 2020-02-27 ENCOUNTER — Inpatient Hospital Stay (HOSPITAL_COMMUNITY): Payer: Self-pay

## 2020-02-27 DIAGNOSIS — R1310 Dysphagia, unspecified: Secondary | ICD-10-CM

## 2020-02-27 LAB — GLUCOSE, CAPILLARY
Glucose-Capillary: 115 mg/dL — ABNORMAL HIGH (ref 70–99)
Glucose-Capillary: 119 mg/dL — ABNORMAL HIGH (ref 70–99)
Glucose-Capillary: 123 mg/dL — ABNORMAL HIGH (ref 70–99)
Glucose-Capillary: 131 mg/dL — ABNORMAL HIGH (ref 70–99)
Glucose-Capillary: 153 mg/dL — ABNORMAL HIGH (ref 70–99)
Glucose-Capillary: 98 mg/dL (ref 70–99)

## 2020-02-27 MED ORDER — LABETALOL HCL 200 MG PO TABS
300.0000 mg | ORAL_TABLET | Freq: Three times a day (TID) | ORAL | Status: DC
  Administered 2020-02-27 – 2020-04-22 (×161): 300 mg
  Filled 2020-02-27 (×2): qty 1
  Filled 2020-02-27: qty 3
  Filled 2020-02-27 (×4): qty 1
  Filled 2020-02-27: qty 3
  Filled 2020-02-27: qty 1
  Filled 2020-02-27: qty 3
  Filled 2020-02-27: qty 1
  Filled 2020-02-27: qty 3
  Filled 2020-02-27 (×10): qty 1
  Filled 2020-02-27: qty 3
  Filled 2020-02-27 (×13): qty 1
  Filled 2020-02-27: qty 3
  Filled 2020-02-27 (×3): qty 1
  Filled 2020-02-27: qty 3
  Filled 2020-02-27 (×7): qty 1
  Filled 2020-02-27 (×2): qty 3
  Filled 2020-02-27: qty 1
  Filled 2020-02-27: qty 3
  Filled 2020-02-27: qty 1
  Filled 2020-02-27: qty 3
  Filled 2020-02-27 (×3): qty 1
  Filled 2020-02-27: qty 3
  Filled 2020-02-27 (×4): qty 1
  Filled 2020-02-27: qty 3
  Filled 2020-02-27 (×2): qty 1
  Filled 2020-02-27: qty 3
  Filled 2020-02-27 (×3): qty 1
  Filled 2020-02-27: qty 3
  Filled 2020-02-27 (×5): qty 1
  Filled 2020-02-27 (×3): qty 3
  Filled 2020-02-27 (×3): qty 1
  Filled 2020-02-27 (×2): qty 3
  Filled 2020-02-27: qty 1
  Filled 2020-02-27: qty 3
  Filled 2020-02-27 (×7): qty 1
  Filled 2020-02-27: qty 3
  Filled 2020-02-27 (×12): qty 1
  Filled 2020-02-27: qty 3
  Filled 2020-02-27: qty 1
  Filled 2020-02-27: qty 3
  Filled 2020-02-27 (×4): qty 1
  Filled 2020-02-27: qty 3
  Filled 2020-02-27 (×3): qty 1
  Filled 2020-02-27: qty 3
  Filled 2020-02-27 (×2): qty 1
  Filled 2020-02-27: qty 3
  Filled 2020-02-27 (×2): qty 1
  Filled 2020-02-27: qty 3
  Filled 2020-02-27 (×2): qty 1
  Filled 2020-02-27: qty 3
  Filled 2020-02-27 (×2): qty 1
  Filled 2020-02-27 (×2): qty 3
  Filled 2020-02-27 (×4): qty 1
  Filled 2020-02-27: qty 3
  Filled 2020-02-27 (×5): qty 1
  Filled 2020-02-27: qty 3
  Filled 2020-02-27 (×18): qty 1
  Filled 2020-02-27: qty 3
  Filled 2020-02-27 (×6): qty 1

## 2020-02-27 NOTE — Consult Note (Signed)
WOC Nurse Consult Note: Reason for Consult: trach wound Wound type: medical device related pressure injury Pressure Injury POA:No Measurement:0.4cm x 1.2cm x 0.2cm  Wound bed:100% pink, very moist  Drainage (amount, consistency, odor) yellow, thick Periwound: intact  Dressing procedure/placement/frequency: Clean trach per policy Cut small piece of Aquacel Ag+([place] [id number])  and place in wound, top with foam dressing. Use Drawtex Kellie Simmering # [phone]) split gauze under trach plate in place of gauze.   Change all dressings ever other day.  Supplies ordered via Financial controller.    Discussed POC with patient and bedside nurse.  Re consult if needed, will not follow at this time. Thanks  Luba Matzen [employer], RN,CWOCN, CNS, [place] [phone])

## 2020-02-27 NOTE — Progress Notes (Signed)
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  11/4, pt s/p L far-lateral craniotomy for clipping of PICA aneurysm. 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    PT Comments    Pt continues to make steady progress towards her goals as she was able to ambulate with a RW with minAx2 up to ~16 ft the first bout and ~8 ft the second bout. She continues to display ataxic movements that impact her safety with mobility and with managing her RW during standing tasks. She also continues to display leg muscle strength deficits resulting in knee instability with stance and poor leg advancement with swing phase. Will continue to follow acutely. Current recommendations remain appropriate.   Follow Up Recommendations  CIR;Supervision/Assistance - 24 hour     Equipment Recommendations  Wheelchair (measurements PT);Wheelchair cushion (measurements PT)    Recommendations for Other Services       Precautions / Restrictions Precautions Precautions: Fall Precaution Comments: trach, wrist restraints Restrictions Weight Bearing Restrictions: No    Mobility  Bed Mobility Overal bed mobility: Needs Assistance Bed Mobility: Supine to Sit     Supine to sit: Min assist;HOB elevated;+2 for physical assistance     General bed mobility comments: HHA and use of bed rails with HOB elevated to ascend trunk to sit EOB, minAx2. Pt required some assistance to manage legs off EOB.  Transfers Overall transfer level: Needs assistance   Transfers: Sit to/from Stand;Stand Pivot Transfers Sit to Stand: Min assist;+2 physical assistance Stand pivot  transfers: Min assist;+2 physical assistance;+2 safety/equipment       General transfer comment: MinAx2 for steadying and cuing pt repeatedly for proper placement of hands prior to transfer and to transition L hand onto RW. Cues provided to sequence weight shifting and leg advancement to stand step transfer to L from bed to chair.  Ambulation/Gait Ambulation/Gait assistance: Min assist;+2 physical assistance;+2 safety/equipment Gait Distance (Feet): 16 Feet (x2 bouts ~16 > ~8 (half anterior and half posterior steps)) Assistive device: Rolling walker (2 wheeled) Gait Pattern/deviations: Step-through pattern;Decreased stride length;Ataxic;Trunk flexed;Decreased weight shift to right;Decreased weight shift to left;Narrow base of support Gait velocity: decreased Gait velocity interpretation: <1.31 ft/sec, indicative of household ambulator General Gait Details: Pt displays ataxic gait pattern and narrow BOS with gait. Required minAx2 to steady pt, cue pt to manage RW and to shift weight, and to physically assist with managing RW. Pt took anterior and posterior steps in room x2 bouts of ~16 ft first bout and ~8 ft second bout.   Stairs             Wheelchair Mobility    Modified Rankin (Stroke Patients Only) Modified Rankin (Stroke Patients Only) Pre-Morbid Rankin Score: No symptoms Modified Rankin: Moderately severe disability     Balance Overall balance assessment: Needs assistance Sitting-balance support: Bilateral upper extremity supported;Feet supported Sitting balance-Leahy Scale: Fair Sitting balance - Comments: Static sitting EOB with UE support, min guard for safety.   Standing balance support: During functional activity;Bilateral upper extremity supported Standing balance-Leahy Scale: Poor Standing balance comment: Reliant on UE support on RW.  Cognition Arousal/Alertness: Awake/alert Behavior During Therapy: Impulsive Overall  Cognitive Status: Impaired/Different from baseline Area of Impairment: Attention;Memory;Following commands;Safety/judgement;Awareness;Problem solving                   Current Attention Level: Selective Memory: Decreased recall of precautions;Decreased short-term memory Following Commands: Follows one step commands with increased time;Follows multi-step commands with increased time;Follows multi-step commands inconsistently Safety/Judgement: Decreased awareness of safety;Decreased awareness of deficits Awareness: Intellectual Problem Solving: Slow processing;Difficulty sequencing General Comments: Pt impulsive at times with movements, requiring repeated cues to maintain attention to task and allow therapist to re-arrange lines as needed. Extra time required to respond and sequence cues provided.      Exercises      General Comments General comments (skin integrity, edema, etc.): Educated pt to perform LAQ and seated marching while up in recliner      Pertinent Vitals/Pain Pain Assessment: Faces Faces Pain Scale: No hurt Pain Intervention(s): Limited activity within patient's tolerance;Monitored during session;Repositioned    Home Living                      Prior Function            PT Goals (current goals can now be found in the care plan section) Acute Rehab PT Goals Patient Stated Goal: unable to state PT Goal Formulation: With patient Time For Goal Achievement: 03/14/20 Potential to Achieve Goals: Good Progress towards PT goals: Progressing toward goals    Frequency    Min 4X/week      PT Plan Current plan remains appropriate    Co-evaluation              AM-PAC PT "6 Clicks" Mobility   Outcome Measure  Help needed turning from your back to your side while in a flat bed without using bedrails?: A Little Help needed moving from lying on your back to sitting on the side of a flat bed without using bedrails?: A Lot Help needed moving to  and from a bed to a chair (including a wheelchair)?: A Lot Help needed standing up from a chair using your arms (e.g., wheelchair or bedside chair)?: A Lot Help needed to walk in hospital room?: A Lot Help needed climbing 3-5 steps with a railing? : Total 6 Click Score: 12    End of Session Equipment Utilized During Treatment: Gait belt;Oxygen Activity Tolerance: Patient tolerated treatment well Patient left: in chair;with call bell/phone within reach;with chair alarm set;with restraints reapplied;with nursing/sitter in room Nurse Communication: Mobility status PT Visit Diagnosis: Hemiplegia and hemiparesis;Other symptoms and signs involving the nervous system (R29.898);Other abnormalities of gait and mobility (R26.89);Unsteadiness on feet (R26.81);Muscle weakness (generalized) (M62.81);Difficulty in walking, not elsewhere classified (R26.2) Hemiplegia - Right/Left: Left Hemiplegia - dominant/non-dominant: Non-dominant Hemiplegia - caused by: Other Nontraumatic intracranial hemorrhage     Time: 1345-1415 PT Time Calculation (min) (ACUTE ONLY): 30 min  Charges:  $Gait Training: 8-22 mins $Therapeutic Activity: 8-22 mins                     Moishe Spice, PT, DPT Acute Rehabilitation Services  Pager: [phone] Office: [place] 02/27/2020, 3:37 PM

## 2020-02-27 NOTE — Progress Notes (Signed)
PROGRESS NOTE    Stacy Moran  HEN:[id number] DOB: January 03, 1956 DOA: 01/30/2020 PCP: No primary care provider on file.    Chief Complaint  Patient presents with  . Headache    Brief Narrative:   64 year old lady with prior history of hypertension presented on November 3 with a ruptured left P ICA aneurysm that required a craniotomy for aneurysmal repair, s/p tracheostomy and total thyroidectomy on 02/08/2020, s/p G-tube placement.  Patient has self decannulated twice in the past and is currently on soft wrist restraints and a bedside sitter.  ENT Dr. Constance Holster is following the patient.  Dr. Constance Holster with ENT recommended continued speech and swallowing evaluation and therapy and she may need a cuffed tracheostomy tube to protect her airway if there is a concern for aspiration.  Patient continues to have increased secretions due to trach.  CIR on board and continues to follow. Patient seen and examined, appears comfortable, denies any pain at this time.  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 Postoperative subarachnoid hemorrhage, followed by neurosurgery.    Tracheostomy with increased secretions. ENT/Dr. Constance Holster on board, recommended continued speech and swallow evaluation and she may need a cuffed tracheostomy tube to protect her airway if there is concern for aspiration. Pulmonary toilet. Patient currently on scopolamine patch and Robinul to help with secretions.   Essential hypertension Blood pressure parameters slightly elevated, currently on Norvasc, hydralazine, labetalol. As needed hydralazine on board.    History of total thyroidectomy Continue with Synthroid 800 MCG daily.  Dysphagia S/p G-tube  placement on 02/22/2020 with tube feeds running.   Prediabetes  Hemoglobin A1c at 6.2 patient currently on sliding scale insulin.   Altered mental status with intermittent delirium. CT of the head does not show any acute changes, has stable hematoma and midline shift.  Pressure Injury 02/12/20 Throat Mid Stage 2 -  Partial thickness loss of dermis presenting as a shallow open injury with a red, pink wound bed without slough. (Active)  02/12/20 1500  Location: Throat  Location Orientation: Mid  Staging: Stage 2 -  Partial thickness loss of dermis presenting as a shallow open injury with a red, pink wound bed without slough.  Wound Description (Comments):   Present on Admission: No         DVT prophylaxis: Heparin Code Status: Full code Family Communication: None at bedside) Disposition:   Status is: Inpatient  Remains inpatient appropriate because:Unsafe d/c plan and Inpatient level of care appropriate due to severity of illness   Dispo: The patient is from: Home              Anticipated d/c is to: CIR              Anticipated d/c date is: 2 days              Patient currently is not medically stable to d/c.       Consultants:   ENT  Neurosurgery  Procedures:  S/p tracheostomy S/p G-tube placement S/p aneurysmal clipping  Antimicrobials: None Subjective: Appears comfortable not in distress at this time  Objective: Vitals:   02/27/20 0400 02/27/20 0518 02/27/20 0802 02/27/20 0841  BP: 125/72 120/75 (!) 157/80   Pulse: 69  72 84  Resp: 19  19 (!) 22  Temp:   98.3 F (36.8 C)   TempSrc:   Oral   SpO2:   100%   Weight:      Height:        Intake/Output Summary (Last 24 hours) at 02/27/2020 1158 Last data filed at 02/26/2020 1805 Gross per 24 hour  Intake 583.75 ml  Output --  Net 583.75 ml   Filed Weights   02/19/20 0500 02/20/20 0425 02/25/20 0500  Weight: 66.8 kg 65.1 kg 68.8 kg    Examination:  General exam: Chronically ill-appearing  s/p trach Respiratory system: Air entry fair bilateral scattered rhonchi Cardiovascular system: S1 & S2 heard, RRR. No JVD, No pedal edema. Gastrointestinal system: Abdomen is nondistended, soft and nontender.  S/p G-tube placement normal bowel sounds heard. Central nervous system: Alert, responding to questions by nodding Extremities: No pedal edema Skin: No rashes seen Psychiatry: Mood is appropriate    Data Reviewed: I have personally reviewed following labs and imaging studies  CBC: Recent Labs  Lab 02/22/20 0402 02/23/20 0300 02/24/20 0033 02/25/20 0501 02/26/20 0652  WBC 3.7* 5.0 2.8* 2.8* 3.9*  HGB 9.7* 10.2* 8.5* 8.4* 9.4*  HCT 31.8* 33.5* 27.9* 27.4* 30.6*  MCV 84.8 84.6 85.1 86.2 84.8  PLT 317 292 PLATELET CLUMPS NOTED ON SMEAR, UNABLE TO ESTIMATE 205 119    Basic Metabolic Panel: Recent Labs  Lab 02/22/20 0402 02/23/20 0300 02/24/20 0033 02/25/20 0501 02/26/20 0652  NA 144 142 141 139 136  K 3.5 3.1* 3.7 3.7 3.1*  CL 108 106 109 108 102  CO2 20* 24 21* 22 26  GLUCOSE 93 157* 120* 95 130*  BUN 8 <5* 11 12 6*  CREATININE 0.68 0.62 0.58 0.49 0.54  CALCIUM 8.5* 8.5* 8.0* 7.8* 8.1*  MG 2.3 2.1 2.0 2.0 1.9    GFR: Estimated Creatinine Clearance: 76.2 mL/min (by C-G formula based on SCr of 0.54 mg/dL).  Liver Function Tests: No results for input(s): AST, ALT, ALKPHOS, BILITOT, PROT, ALBUMIN in the last 168 hours.  CBG: Recent Labs  Lab 02/26/20 1618 02/26/20 1951 02/26/20 2313 02/27/20 0326 02/27/20 0809  GLUCAP 126* 126* 93 153* 119*     Recent Results (from the past 240 hour(s))  Aerobic Culture (superficial specimen)     Status: None   Collection Time: 02/18/20  9:43 PM   Specimen: Wound  Result Value Ref Range Status   Specimen Description WOUND NECK  Final   Special Requests NONE  Final   Gram Stain   Final    MODERATE WBC PRESENT,BOTH PMN AND MONONUCLEAR ABUNDANT GRAM POSITIVE COCCI ABUNDANT GRAM NEGATIVE RODS    Culture   Final     FEW MULTIPLE ORGANISMS PRESENT, NONE PREDOMINANT NO STAPHYLOCOCCUS AUREUS ISOLATED NO GROUP A STREP (S.PYOGENES) ISOLATED Performed at [facility], [place] [address] [place], [place] [address]    Report Status 02/20/2020 FINAL  Final         Radiology Studies: DG Swallowing Func-Speech Pathology  Result Date: 02/27/2020 Objective Swallowing Evaluation: Type of Study: MBS-Modified Barium Swallow Study  Patient Details Name: Stacy Moran MRN: [id number] Date of Birth: 1955-10-25 Today's Date: 02/27/2020 Time: SLP Start Time (ACUTE ONLY): 0930 -SLP Stop Time (ACUTE ONLY): 0940 SLP Time Calculation (min) (ACUTE ONLY): 10 min Past Medical History: No past medical history on file. Past Surgical History: Past Surgical History: Procedure Laterality Date . CRANIOTOMY Left 01/30/2020  Procedure: LEFT FAR LATERAL CRANIOTOMY FOR ANEURYSM  CLIPPING;  Surgeon: [name], MD;  Location: [place];  Service: Neurosurgery;  Laterality: Left; . IR ANGIO INTRA EXTRACRAN SEL INTERNAL CAROTID BILAT MOD SED  01/30/2020 . IR ANGIO VERTEBRAL SEL VERTEBRAL UNI L MOD SED  01/30/2020 . IR GASTROSTOMY TUBE MOD SED  02/22/2020 . RADIOLOGY WITH ANESTHESIA N/A 01/30/2020  Procedure: IR WITH ANESTHESIA;  Surgeon: [name], MD;  Location: [place];  Service: Radiology;  Laterality: N/A; . THYROIDECTOMY N/A 02/08/2020  Procedure: THYROIDECTOMY;  Surgeon: [name], MD;  Location: [place];  Service: ENT;  Laterality: N/A; . TRACHEOSTOMY TUBE PLACEMENT N/A 02/08/2020  Procedure: TRACHEOSTOMY;  Surgeon: [name], MD;  Location: [place];  Service: ENT;  Laterality: N/A; HPI: 64 y/o presented with large subarachnoid hemorrhage due to ruptured PICA with resultant hydrocephalus. Admitted on 11/3, [place] on 11/4, vented until 11/12 when pt was trached. Also found to have multinodular goiter complicating respiratory failure so total thyroidectomy also completed on 11/12. Drains removed on 11/14. ATC on 11/15. Changed to 6  cuffledd on 11/17.  There is question of left lingual deviation in notes.  No data recorded Assessment / Plan / Recommendation CHL IP CLINICAL IMPRESSIONS 02/27/2020 Clinical Impression Pt demonstrates slight improvement in swallow function today in comparison with last study. Notably there is decreased epiglottic and laryngeal edema. However, there continues to be appearance of significant subglottic edema or stenosis, narrowing the subglottic space to what appears to be less than a mm diameter, visible as aspirated barium squeezes though the trachea. In many instances of swallowing, pt is able to achieve some base of tongue propulsion and upper pharyngeal contraction, but bolus stops above the UES with minimal opening of UES resulting in aspiration during/post swallow as force of propulsion pushes the bolus into the the vestibule and trachea. Pt has no sensation. When a 1/2 teaspoon of honey or puree is given with a cued chin tuck, pt does achieve some hyolaryngeal excursion and opening of UES with trace amounts of barium entering the esophagus. The residual falls into the airway post swallow as pt cannot achieve adequate redirection of air through upper airway with PMSV in place to eject any penetrate or aspirate. She also only intermittently follows these complex rapid commands. Reported findings to ENT and SLP will f/u for trials of swallowing therapy, though quantity of PO must be limited given severity of aspriation events.  SLP Visit Diagnosis Dysphagia, pharyngeal phase (R13.13) Attention and concentration deficit following -- Frontal lobe and executive function deficit following -- Impact on safety and function Severe aspiration risk;Risk for inadequate nutrition/hydration   CHL IP TREATMENT RECOMMENDATION 02/27/2020 Treatment Recommendations Therapy as outlined in treatment plan below   Prognosis 02/27/2020 Prognosis for Safe Diet Advancement Guarded Barriers to Reach Goals Severity of deficits  Barriers/Prognosis Comment -- CHL IP DIET RECOMMENDATION 02/27/2020 SLP Diet Recommendations NPO;Alternative means - long-term Liquid Administration via -- Medication Administration -- Compensations -- Postural Changes --   CHL IP OTHER RECOMMENDATIONS 02/19/2020 Recommended Consults -- Oral Care Recommendations Oral care QID Other Recommendations --   CHL IP FOLLOW UP RECOMMENDATIONS 02/27/2020 Follow up Recommendations Inpatient Rehab   CHL IP FREQUENCY AND DURATION 02/27/2020 Speech Therapy Frequency (ACUTE ONLY) min 2x/week Treatment Duration 2 weeks      CHL IP ORAL PHASE 02/27/2020 Oral Phase WFL Oral - Pudding Teaspoon -- Oral - Pudding Cup -- Oral - Honey Teaspoon -- Oral - Honey Cup -- Oral - Nectar Teaspoon -- Oral - Nectar Cup -- Oral - Nectar Straw --  Oral - Thin Teaspoon -- Oral - Thin Cup -- Oral - Thin Straw -- Oral - Puree -- Oral - Mech Soft -- Oral - Regular -- Oral - Multi-Consistency -- Oral - Pill -- Oral Phase - Comment --  CHL IP PHARYNGEAL PHASE 02/27/2020 Pharyngeal Phase Impaired Pharyngeal- Pudding Teaspoon -- Pharyngeal -- Pharyngeal- Pudding Cup -- Pharyngeal -- Pharyngeal- Honey Teaspoon Reduced epiglottic inversion;Reduced anterior laryngeal mobility;Penetration/Apiration after swallow;Significant aspiration (Amount);Pharyngeal residue - pyriform;Compensatory strategies attempted (with notebox) Pharyngeal Material enters airway, passes BELOW cords without attempt by patient to eject out (silent aspiration) Pharyngeal- Honey Cup -- Pharyngeal -- Pharyngeal- Nectar Teaspoon Reduced epiglottic inversion;Reduced anterior laryngeal mobility;Penetration/Apiration after swallow;Significant aspiration (Amount);Pharyngeal residue - pyriform;Compensatory strategies attempted (with notebox) Pharyngeal Material enters airway, passes BELOW cords without attempt by patient to eject out (silent aspiration) Pharyngeal- Nectar Cup -- Pharyngeal -- Pharyngeal- Nectar Straw -- Pharyngeal -- Pharyngeal- Thin  Teaspoon -- Pharyngeal -- Pharyngeal- Thin Cup NT Pharyngeal -- Pharyngeal- Thin Straw -- Pharyngeal -- Pharyngeal- Puree Reduced epiglottic inversion;Reduced anterior laryngeal mobility;Penetration/Apiration after swallow;Significant aspiration (Amount);Pharyngeal residue - pyriform;Compensatory strategies attempted (with notebox) Pharyngeal Material enters airway, passes BELOW cords without attempt by patient to eject out (silent aspiration) Pharyngeal- Mechanical Soft -- Pharyngeal -- Pharyngeal- Regular -- Pharyngeal -- Pharyngeal- Multi-consistency -- Pharyngeal -- Pharyngeal- Pill -- Pharyngeal -- Pharyngeal Comment --  No flowsheet data found. De[place], [name] 02/27/2020, 11:43 AM                   Scheduled Meds: . amLODipine  10 mg Per Tube Daily  . chlorhexidine gluconate (MEDLINE KIT)  15 mL Mouth Rinse BID  . Chlorhexidine Gluconate Cloth  6 each Topical Daily  . docusate  100 mg Per Tube BID  . feeding supplement (PROSource TF)  45 mL Per Tube BID  . glycopyrrolate  1 mg Per Tube TID  . heparin  5,000 Units Subcutaneous Q8H  . hydrALAZINE  25 mg Per Tube Q8H  . insulin aspart  0-20 Units Subcutaneous Q4H  . labetalol  300 mg Oral TID  . levothyroxine  100 mcg Per Tube Q0600  . mouth rinse  15 mL Mouth Rinse 10 times per day  . metoCLOPramide  5 mg Per Tube Q6H  . neomycin-bacitracin-polymyxin  1 application Topical Daily  . pantoprazole sodium  40 mg Per Tube Daily  . polyethylene glycol  17 g Per Tube Daily  . polyvinyl alcohol  1 drop Left Eye BID  . scopolamine  1 patch Transdermal Q72H  . [START ON 03/03/2020] scopolamine  1 patch Transdermal Q72H  . sodium chloride flush  10-40 mL Intracatheter Q12H   Continuous Infusions: .  ceFAZolin (ANCEF) IV    . feeding supplement (OSMOLITE 1.2 CAL) 1,000 mL (02/25/20 1516)     LOS: 28 days     Hosie Poisson, MD [facility]   To contact the attending provider between 7A-7P or the covering provider during  after hours 7P-7A, please log into the web site [email or web address] and access using universal [place] password for that web site. If you do not have the password, please call the hospital operator.  02/27/2020, 11:58 AM

## 2020-02-27 NOTE — Progress Notes (Signed)
RT cleaned and assessed trach site. Wound site around trach red with oozing secretions surrounding. This RT cleaned and placed Mepilex and drawtex pads. Also changed inner cannula and trach collar set up.

## 2020-02-27 NOTE — Progress Notes (Signed)
Modified Barium Swallow Progress Note  Patient Details  Name: Stacy Moran MRN: [id number] Date of Birth: 1956-03-08  Today's Date: 02/27/2020  Modified Barium Swallow completed.  Full report located under Chart Review in the Imaging Section.  Brief recommendations include the following:  Clinical Impression  Pt demonstrates slight improvement in swallow function today in comparison with last study. Notably there is decreased epiglottic and laryngeal edema. However, there continues to be appearance of significant subglottic edema or stenosis, narrowing the subglottic space to what appears to be less than a mm diameter, visible as aspirated barium squeezes though the trachea. In many instances of swallowing, pt is able to achieve some base of tongue propulsion and upper pharyngeal contraction, but bolus stops above the UES with minimal opening of UES resulting in aspiration during/post swallow as force of propulsion pushes the bolus into the the vestibule and trachea. Pt has no sensation. When a 1/2 teaspoon of honey or puree is given with a cued chin tuck, pt does achieve some hyolaryngeal excursion and opening of UES with trace amounts of barium entering the esophagus. The residual falls into the airway post swallow as pt cannot achieve adequate redirection of air through upper airway with PMSV in place to eject any penetrate or aspirate. She also only intermittently follows these complex rapid commands. Reported findings to ENT and SLP will f/u for trials of swallowing therapy, though quantity of PO must be limited given severity of aspriation events.    Swallow Evaluation Recommendations       SLP Diet Recommendations: NPO;Alternative means - long-term                               Herbie Baltimore, MA CCC-SLP  Acute Rehabilitation Services Pager [phone] Office [phone]  Othelia Pulling, [name] 02/27/2020,11:41 AM

## 2020-02-27 NOTE — Progress Notes (Signed)
  NEUROSURGERY PROGRESS NOTE   No issues overnight. No concerns this am  EXAM:  BP 120/75   Pulse 69   Temp 98.6 F (37 C) (Oral)   Resp 19   Ht 5\' 9"  (1.753 m)   Wt 68.8 kg   SpO2 99%   BMI 22.40 kg/m   Sleeping, easily awakened Mouths responses,answers questions appropriately Follows commands with all extremities Wound c/d/i  IMPRESSION/PLAN 64 y.o. female s/pclipping L PICA aneurysm. Neurologically stable, multiple intubation/extubation and tracheostomy requiring concurrent thyroidectomy. S/P G tube. Lurline Idol per ENT. no new NS recs  - CIR candidate. Dispo planning.

## 2020-02-28 LAB — BASIC METABOLIC PANEL
Anion gap: 10 (ref 5–15)
BUN: 10 mg/dL (ref 8–23)
CO2: 31 mmol/L (ref 22–32)
Calcium: 8.6 mg/dL — ABNORMAL LOW (ref 8.9–10.3)
Chloride: 96 mmol/L — ABNORMAL LOW (ref 98–111)
Creatinine, Ser: 0.6 mg/dL (ref 0.44–1.00)
GFR, Estimated: >60 mL/min (ref >60)
Glucose, Bld: 113 mg/dL — ABNORMAL HIGH (ref 70–99)
Potassium: 4.1 mmol/L (ref 3.5–5.1)
Sodium: 137 mmol/L (ref 135–145)

## 2020-02-28 LAB — GLUCOSE, CAPILLARY
Glucose-Capillary: 156 mg/dL — ABNORMAL HIGH (ref 70–99)
Glucose-Capillary: 89 mg/dL (ref 70–99)
Glucose-Capillary: 108 mg/dL — ABNORMAL HIGH (ref 70–99)
Glucose-Capillary: 117 mg/dL — ABNORMAL HIGH (ref 70–99)
Glucose-Capillary: 159 mg/dL — ABNORMAL HIGH (ref 70–99)

## 2020-02-28 NOTE — Progress Notes (Signed)
Scheduled for direct laryngoscopy under anesthesia tomorrow morning.

## 2020-02-28 NOTE — Progress Notes (Signed)
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  11/4, pt s/p L far-lateral craniotomy for clipping of PICA aneurysm. 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    PT Comments    Pt is continuing to make significant progress towards her goals. Treated pt in conjunction with OT to maximize pt safety and quality of session. Focused session primarily on advancing her gait and improving her safety with managing a RW. She continues to display ataxic movements that place her at risk for falls. In addition, she displays poor feet clearance with swing phase in gait and required cues to correct with min success. She ambulated up to ~50 ft with one gait bout and required minAx2 to manage RW and lines and provide stability, especially when turning around with RW, due to bouts of increased trunk sway and LOB. Extensive cues provided to sequence turning RW slightly then stepping into RW during turns, with poor carryover noted. Will continue to follow acutely. Pt would greatly benefit from intensive therapy services in the CIR setting to maximize her safety and independence with all functional mobility.  Follow Up Recommendations  CIR;Supervision/Assistance - 24 hour     Equipment Recommendations  Wheelchair (measurements PT);Wheelchair cushion (measurements PT)    Recommendations for Other Services       Precautions / Restrictions Precautions Precautions: Fall Precaution Comments: trach, wrist restraints Restrictions Weight Bearing Restrictions: No    Mobility  Bed Mobility Overal bed mobility: Needs Assistance Bed Mobility: Supine to Sit      Supine to sit: Min assist;HOB elevated     General bed mobility comments: HHA and use of bed rails with HOB elevated to ascend trunk to sit EOB, minA.   Transfers Overall transfer level: Needs assistance   Transfers: Sit to/from Stand Sit to Stand: Min assist         General transfer comment: MinA for steadying and cuing pt for proper hand placement to push up to stand. Extra time to power up to stand.  Ambulation/Gait Ambulation/Gait assistance: Min assist;+2 physical assistance;+2 safety/equipment Gait Distance (Feet): 50 Feet (x2 bouts of ~50 ft > ~10 ft) Assistive device: Rolling walker (2 wheeled) Gait Pattern/deviations: Step-through pattern;Decreased stride length;Ataxic;Trunk flexed;Narrow base of support Gait velocity: decreased Gait velocity interpretation: <1.31 ft/sec, indicative of household ambulator General Gait Details: Displays ataxic gait pattern and requires extensive repeated tactile and verbal cues to maintain proximity to RW. Single step commands to sequence turns (moving RW, then stepping into walker and repeat), with poor carryover. Cues to improve feet clearance with min success.    Stairs             Wheelchair Mobility    Modified Rankin (Stroke Patients Only) Modified Rankin (Stroke Patients Only) Pre-Morbid Rankin Score: No symptoms Modified Rankin: Moderately severe disability     Balance Overall balance assessment: Needs assistance Sitting-balance support: Feet supported Sitting balance-Leahy Scale: Fair Sitting balance - Comments: Static and dynamic sitting reaching off BOS with min guard for safety,   Standing balance support: During functional activity;Bilateral upper extremity supported;Single extremity supported Standing balance-Leahy Scale: Poor Standing balance comment: Reliant on UE support with min reaching off BOS.  Cognition Arousal/Alertness: Awake/alert Behavior During  Therapy: Impulsive Overall Cognitive Status: Impaired/Different from baseline Area of Impairment: Attention;Memory;Following commands;Safety/judgement;Awareness;Problem solving                   Current Attention Level: Selective Memory: Decreased recall of precautions;Decreased short-term memory Following Commands: Follows one step commands with increased time;Follows multi-step commands with increased time;Follows multi-step commands inconsistently Safety/Judgement: Decreased awareness of safety;Decreased awareness of deficits Awareness: Emergent Problem Solving: Slow processing;Difficulty sequencing;Requires verbal cues;Requires tactile cues General Comments: Pt impulsive with ataxic movements and requires cues to remain on task. Extra time required to process and respoind to cues. Single step commands to sequence turns with RW.      Exercises      General Comments General comments (skin integrity, edema, etc.): HR 90-100 with gait SpO2 >/= 98% throughout      Pertinent Vitals/Pain Pain Assessment: No/denies pain Faces Pain Scale: No hurt Pain Intervention(s): Limited activity within patient's tolerance;Monitored during session;Repositioned    Home Living                      Prior Function            PT Goals (current goals can now be found in the care plan section) Acute Rehab PT Goals Patient Stated Goal: to go home PT Goal Formulation: With patient Time For Goal Achievement: 03/14/20 Potential to Achieve Goals: Good Progress towards PT goals: Progressing toward goals    Frequency    Min 4X/week      PT Plan Current plan remains appropriate    Co-evaluation PT/OT/SLP Co-Evaluation/Treatment: Yes Reason for Co-Treatment: For patient/therapist safety;To address functional/ADL transfers;Necessary to address cognition/behavior during functional activity PT goals addressed during session: Mobility/safety with mobility;Balance;Proper use of DME         AM-PAC PT "6 Clicks" Mobility   Outcome Measure  Help needed turning from your back to your side while in a flat bed without using bedrails?: A Little Help needed moving from lying on your back to sitting on the side of a flat bed without using bedrails?: A Lot Help needed moving to and from a bed to a chair (including a wheelchair)?: A Lot Help needed standing up from a chair using your arms (e.g., wheelchair or bedside chair)?: A Little Help needed to walk in hospital room?: A Lot Help needed climbing 3-5 steps with a railing? : Total 6 Click Score: 13    End of Session Equipment Utilized During Treatment: Gait belt;Oxygen Activity Tolerance: Patient tolerated treatment well Patient left: in chair;with call bell/phone within reach;with chair alarm set;with restraints reapplied Nurse Communication: Mobility status PT Visit Diagnosis: Hemiplegia and hemiparesis;Other symptoms and signs involving the nervous system (R29.898);Other abnormalities of gait and mobility (R26.89);Unsteadiness on feet (R26.81);Muscle weakness (generalized) (M62.81);Difficulty in walking, not elsewhere classified (R26.2) Hemiplegia - Right/Left: Left Hemiplegia - dominant/non-dominant: Non-dominant Hemiplegia - caused by: Other Nontraumatic intracranial hemorrhage     Time: 1497-0263 PT Time Calculation (min) (ACUTE ONLY): 30 min  Charges:  $Gait Training: 8-22 mins                     Moishe Spice, PT, DPT Acute Rehabilitation Services  Pager: [phone] Office: [place] 02/28/2020, 11:16 AM

## 2020-02-28 NOTE — Progress Notes (Signed)
  NEUROSURGERY PROGRESS NOTE   No issues overnight.  Patient eager for discharge  EXAM:  BP (!) 150/84   Pulse 85   Temp 98.2 F (36.8 C) (Oral)   Resp (!) 22   Ht 5\' 9"  (1.753 m)   Wt 74.3 kg   SpO2 100%   BMI 24.19 kg/m   Sitting upright in chair  Mouths responses,answers questions appropriately Follows commands with all extremities Wound c/d/i  IMPRESSION/PLAN 64 y.o. female s/pclipping L PICA aneurysm. Neurologically stable, multiple intubation/extubation and tracheostomy requiring concurrent thyroidectomy. S/P G tube. Lurline Idol per ENT. Going for laryngoscopy tomorrow. -  no new NS recs  - CIR candidate. Dispo planning.

## 2020-02-28 NOTE — Progress Notes (Signed)
  Speech Language Pathology Treatment: Dysphagia  Patient Details Name: Stacy Moran MRN: [id number] DOB: 12-24-1955 Today's Date: 02/28/2020 Time: 0850-0900 SLP Time Calculation (min) (ACUTE ONLY): 10 min  Assessment / Plan / Recommendation Clinical Impression  Session focused on assisting pt in the pattern on chin tuck , swallow hard x2 cough and swallow again, which she struggled with during the MBS. Pt was able to with 1/1 verbal cueing and some tactile cueing. Pt had some intermittent spontaneous coughing with three teaspoons of honey thick water given. With PMSV in place pt was not able to phonte or signficant redirect air to upper airway. Pt then needed toileting assistance.    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 Admitted on 11/3, [place] on 11/4, vented until 11/12 when pt was trached. Also found to have multinodular goiter complicating respiratory failure so total thyroidectomy also completed on 11/12. Drains removed on 11/14. ATC on 11/15. Changed to 6 cuffledd on 11/17.  There is question of left lingual deviation in notes.       SLP Plan  Continue with current plan of care       Recommendations  Diet recommendations: NPO      Patient may use Passy-Muir Speech Valve: with SLP only         Plan: Continue with current plan of care       GO               Herbie [place], MA [place] Pager [phone] Office [phone]  [name] 02/28/2020, 9:52 AM

## 2020-02-28 NOTE — Progress Notes (Signed)
Inpatient Rehab Admissions Coordinator:   Continue to follow for medical readiness and rehab needs.   Shann Medal, PT, DPT Admissions Coordinator [phone] 02/28/20  11:36 AM

## 2020-02-28 NOTE — Progress Notes (Signed)
PROGRESS NOTE    Stacy Moran  VQX:[id number] DOB: 09-04-1955 DOA: 01/30/2020 PCP: No primary care provider on file.    Chief Complaint  Patient presents with  . Headache    Brief Narrative:   64 year old lady with prior history of hypertension presented on November 3 with a ruptured left P ICA aneurysm that required a craniotomy for aneurysmal repair, s/p tracheostomy and total thyroidectomy on 02/08/2020, s/p G-tube placement.  Patient has self decannulated twice in the past and is currently on soft wrist restraints and a bedside sitter.  ENT Dr. Constance Holster is following the patient.  Dr. Constance Holster with ENT recommended continued speech and swallowing evaluation and therapy and she may need a cuffed tracheostomy tube to protect her airway if there is a concern for aspiration.  Patient continues to have increased secretions due to trach.  CIR on board and continues to follow. Patient seen and examined.  She was walking in the hallway with the help of PT.  No new complaints at this time.  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 Postoperative subarachnoid hemorrhage, followed by neurosurgery.    Tracheostomy with increased secretions. ENT/Dr. Constance Holster on board, recommended continued speech and swallow evaluation and she may need a cuffed tracheostomy tube to protect her airway if there is concern for aspiration. Pulmonary toilet. Patient currently on scopolamine patch and Robinul to help with secretions. She is scheduled for direct laryngoscopy under anesthesia tomorrow morning  Essential hypertension Blood pressure parameters have optimized.  Currently on Norvasc, hydralazine, labetalol. As needed hydralazine on  board.  Hypokalemia Replaced Repeat labs ordered for today.   Anemia of chronic disease Hemoglobin stable around 9 Continue to monitor.    History of total thyroidectomy Continue with Synthroid 800 MCG daily.  Dysphagia S/p G-tube placement on 02/22/2020 with tube feeds running. SLP evaluation recommending n.p.o. and that she may use Passy-Muir speech bowel with SLP only.  Meanwhile continue with n.p.o. and tube feeds.  Prediabetes  Hemoglobin A1c at 6.2 patient currently on sliding scale insulin.   Altered mental status with intermittent delirium. CT of the head does not show any acute changes, has stable hematoma and midline shift. Patient is alert and following commands,.    Pressure injury  Pressure Injury 02/12/20 Throat Mid Stage 2 -  Partial thickness loss of dermis presenting as a shallow open injury with a red, pink wound bed without slough. (Active)  02/12/20 1500  Location: Throat  Location Orientation: Mid  Staging: Stage 2 -  Partial thickness loss of dermis presenting as a shallow open injury with a red, pink wound bed without slough.  Wound Description (Comments):   Present on Admission: No  Wound care consulted and recommendations given.       DVT prophylaxis: Heparin Code Status: Full code Family Communication: None at bedside) Disposition:   Status is: Inpatient  Remains inpatient appropriate because:Unsafe d/c plan and Inpatient level of care appropriate due to severity of illness   Dispo: The patient is from: Home              Anticipated d/c is to: CIR              Anticipated d/c date is: 2 days  Patient currently is not medically stable to d/c.       Consultants:   ENT  Neurosurgery  Procedures:  S/p tracheostomy S/p G-tube placement S/p aneurysmal clipping  Antimicrobials: None Subjective: Appears comfortable not in distress at this time  Objective: Vitals:   02/28/20 0747 02/28/20 0940 02/28/20 1129  02/28/20 1223  BP:      Pulse:  81 85   Resp:  (!) 24 (!) 22   Temp: 97.8 F (36.6 C)   98.2 F (36.8 C)  TempSrc: Oral   Oral  SpO2:  99% 100%   Weight:      Height:        Intake/Output Summary (Last 24 hours) at 02/28/2020 1231 Last data filed at 02/27/2020 2210 Gross per 24 hour  Intake 0 ml  Output 150 ml  Net -150 ml   Filed Weights   02/20/20 0425 02/25/20 0500 02/28/20 0414  Weight: 65.1 kg 68.8 kg 74.3 kg    Examination:  General exam: Ill-appearing lady s/p trach Respiratory system: Air entry fair bilateral, scattered rhonchi, no wheezing heard Cardiovascular system: S1-S2 heard, regular rate rhythm, no JVD, no pedal edema Gastrointestinal system abdomen is soft, nontender, s/p G-tube placement normal bowel sounds heard. Central nervous system: Alert, walking in the hallway with the help of PT, following commands. Extremities: No pedal edema Skin: No rashes seen Psychiatry: Appropriate mood    Data Reviewed: I have personally reviewed following labs and imaging studies  CBC: Recent Labs  Lab 02/22/20 0402 02/23/20 0300 02/24/20 0033 02/25/20 0501 02/26/20 0652  WBC 3.7* 5.0 2.8* 2.8* 3.9*  HGB 9.7* 10.2* 8.5* 8.4* 9.4*  HCT 31.8* 33.5* 27.9* 27.4* 30.6*  MCV 84.8 84.6 85.1 86.2 84.8  PLT 317 292 PLATELET CLUMPS NOTED ON SMEAR, UNABLE TO ESTIMATE 205 706    Basic Metabolic Panel: Recent Labs  Lab 02/22/20 0402 02/23/20 0300 02/24/20 0033 02/25/20 0501 02/26/20 0652  NA 144 142 141 139 136  K 3.5 3.1* 3.7 3.7 3.1*  CL 108 106 109 108 102  CO2 20* 24 21* 22 26  GLUCOSE 93 157* 120* 95 130*  BUN 8 <5* 11 12 6*  CREATININE 0.68 0.62 0.58 0.49 0.54  CALCIUM 8.5* 8.5* 8.0* 7.8* 8.1*  MG 2.3 2.1 2.0 2.0 1.9    GFR: Estimated Creatinine Clearance: 76.2 mL/min (by C-G formula based on SCr of 0.54 mg/dL).  Liver Function Tests: No results for input(s): AST, ALT, ALKPHOS, BILITOT, PROT, ALBUMIN in the last 168 hours.  CBG: Recent Labs  Lab  02/27/20 2021 02/27/20 2344 02/28/20 0416 02/28/20 0748 02/28/20 1151  GLUCAP 123* 98 156* 89 108*     Recent Results (from the past 240 hour(s))  Aerobic Culture (superficial specimen)     Status: None   Collection Time: 02/18/20  9:43 PM   Specimen: Wound  Result Value Ref Range Status   Specimen Description WOUND NECK  Final   Special Requests NONE  Final   Gram Stain   Final    MODERATE WBC PRESENT,BOTH PMN AND MONONUCLEAR ABUNDANT GRAM POSITIVE COCCI ABUNDANT GRAM NEGATIVE RODS    Culture   Final    FEW MULTIPLE ORGANISMS PRESENT, NONE PREDOMINANT NO STAPHYLOCOCCUS AUREUS ISOLATED NO GROUP A STREP (S.PYOGENES) ISOLATED Performed at [facility], [place] [address], [place], [place] [address]    Report Status 02/20/2020 FINAL  Final         Radiology Studies: DG Swallowing Func-Speech Pathology  Result Date: 02/27/2020  Objective Swallowing Evaluation: Type of Study: MBS-Modified Barium Swallow Study  Patient Details Name: Stacy Moran MRN: [id number] Date of Birth: 06-Jan-1956 Today's Date: 02/27/2020 Time: SLP Start Time (ACUTE ONLY): 0930 -SLP Stop Time (ACUTE ONLY): 0940 SLP Time Calculation (min) (ACUTE ONLY): 10 min Past Medical History: No past medical history on file. Past Surgical History: Past Surgical History: Procedure Laterality Date . CRANIOTOMY Left 01/30/2020  Procedure: LEFT FAR LATERAL CRANIOTOMY FOR ANEURYSM CLIPPING;  Surgeon: [name], MD;  Location: [facility];  Service: Neurosurgery;  Laterality: Left; . IR ANGIO INTRA EXTRACRAN SEL INTERNAL CAROTID BILAT MOD SED  01/30/2020 . IR ANGIO VERTEBRAL SEL VERTEBRAL UNI L MOD SED  01/30/2020 . IR GASTROSTOMY TUBE MOD SED  02/22/2020 . RADIOLOGY WITH ANESTHESIA N/A 01/30/2020  Procedure: IR WITH ANESTHESIA;  Surgeon: [name], MD;  Location: [facility];  Service: Radiology;  Laterality: N/A; . THYROIDECTOMY N/A 02/08/2020  Procedure: THYROIDECTOMY;  Surgeon: [name], MD;  Location: [facility];  Service:  ENT;  Laterality: N/A; . TRACHEOSTOMY TUBE PLACEMENT N/A 02/08/2020  Procedure: TRACHEOSTOMY;  Surgeon: [name], MD;  Location: [facility];  Service: ENT;  Laterality: N/A; HPI: 64 y/o presented with large subarachnoid hemorrhage due to ruptured PICA with resultant hydrocephalus. Admitted on 11/3, [place] on 11/4, vented until 11/12 when pt was trached. Also found to have multinodular goiter complicating respiratory failure so total thyroidectomy also completed on 11/12. Drains removed on 11/14. ATC on 11/15. Changed to 6 cuffledd on 11/17.  There is question of left lingual deviation in notes.  No data recorded Assessment / Plan / Recommendation CHL IP CLINICAL IMPRESSIONS 02/27/2020 Clinical Impression Pt demonstrates slight improvement in swallow function today in comparison with last study. Notably there is decreased epiglottic and laryngeal edema. However, there continues to be appearance of significant subglottic edema or stenosis, narrowing the subglottic space to what appears to be less than a mm diameter, visible as aspirated barium squeezes though the trachea. In many instances of swallowing, pt is able to achieve some base of tongue propulsion and upper pharyngeal contraction, but bolus stops above the UES with minimal opening of UES resulting in aspiration during/post swallow as force of propulsion pushes the bolus into the the vestibule and trachea. Pt has no sensation. When a 1/2 teaspoon of honey or puree is given with a cued chin tuck, pt does achieve some hyolaryngeal excursion and opening of UES with trace amounts of barium entering the esophagus. The residual falls into the airway post swallow as pt cannot achieve adequate redirection of air through upper airway with PMSV in place to eject any penetrate or aspirate. She also only intermittently follows these complex rapid commands. Reported findings to ENT and SLP will f/u for trials of swallowing therapy, though quantity of PO must be limited given  severity of aspriation events.  SLP Visit Diagnosis Dysphagia, pharyngeal phase (R13.13) Attention and concentration deficit following -- Frontal lobe and executive function deficit following -- Impact on safety and function Severe aspiration risk;Risk for inadequate nutrition/hydration   CHL IP TREATMENT RECOMMENDATION 02/27/2020 Treatment Recommendations Therapy as outlined in treatment plan below   Prognosis 02/27/2020 Prognosis for Safe Diet Advancement Guarded Barriers to Reach Goals Severity of deficits Barriers/Prognosis Comment -- CHL IP DIET RECOMMENDATION 02/27/2020 SLP Diet Recommendations NPO;Alternative means - long-term Liquid Administration via -- Medication Administration -- Compensations -- Postural Changes --   CHL IP OTHER RECOMMENDATIONS 02/19/2020 Recommended Consults -- Oral Care Recommendations Oral care QID Other Recommendations --   CHL  IP FOLLOW UP RECOMMENDATIONS 02/27/2020 Follow up Recommendations Inpatient Rehab   CHL IP FREQUENCY AND DURATION 02/27/2020 Speech Therapy Frequency (ACUTE ONLY) min 2x/week Treatment Duration 2 weeks      CHL IP ORAL PHASE 02/27/2020 Oral Phase WFL Oral - Pudding Teaspoon -- Oral - Pudding Cup -- Oral - Honey Teaspoon -- Oral - Honey Cup -- Oral - Nectar Teaspoon -- Oral - Nectar Cup -- Oral - Nectar Straw -- Oral - Thin Teaspoon -- Oral - Thin Cup -- Oral - Thin Straw -- Oral - Puree -- Oral - Mech Soft -- Oral - Regular -- Oral - Multi-Consistency -- Oral - Pill -- Oral Phase - Comment --  CHL IP PHARYNGEAL PHASE 02/27/2020 Pharyngeal Phase Impaired Pharyngeal- Pudding Teaspoon -- Pharyngeal -- Pharyngeal- Pudding Cup -- Pharyngeal -- Pharyngeal- Honey Teaspoon Reduced epiglottic inversion;Reduced anterior laryngeal mobility;Penetration/Apiration after swallow;Significant aspiration (Amount);Pharyngeal residue - pyriform;Compensatory strategies attempted (with notebox) Pharyngeal Material enters airway, passes BELOW cords without attempt by patient to eject out  (silent aspiration) Pharyngeal- Honey Cup -- Pharyngeal -- Pharyngeal- Nectar Teaspoon Reduced epiglottic inversion;Reduced anterior laryngeal mobility;Penetration/Apiration after swallow;Significant aspiration (Amount);Pharyngeal residue - pyriform;Compensatory strategies attempted (with notebox) Pharyngeal Material enters airway, passes BELOW cords without attempt by patient to eject out (silent aspiration) Pharyngeal- Nectar Cup -- Pharyngeal -- Pharyngeal- Nectar Straw -- Pharyngeal -- Pharyngeal- Thin Teaspoon -- Pharyngeal -- Pharyngeal- Thin Cup NT Pharyngeal -- Pharyngeal- Thin Straw -- Pharyngeal -- Pharyngeal- Puree Reduced epiglottic inversion;Reduced anterior laryngeal mobility;Penetration/Apiration after swallow;Significant aspiration (Amount);Pharyngeal residue - pyriform;Compensatory strategies attempted (with notebox) Pharyngeal Material enters airway, passes BELOW cords without attempt by patient to eject out (silent aspiration) Pharyngeal- Mechanical Soft -- Pharyngeal -- Pharyngeal- Regular -- Pharyngeal -- Pharyngeal- Multi-consistency -- Pharyngeal -- Pharyngeal- Pill -- Pharyngeal -- Pharyngeal Comment --  No flowsheet data found. DeBlois, [name] 02/27/2020, 11:43 AM                   Scheduled Meds: . amLODipine  10 mg Per Tube Daily  . chlorhexidine gluconate (MEDLINE KIT)  15 mL Mouth Rinse BID  . Chlorhexidine Gluconate Cloth  6 each Topical Daily  . docusate  100 mg Per Tube BID  . feeding supplement (PROSource TF)  45 mL Per Tube BID  . glycopyrrolate  1 mg Per Tube TID  . heparin  5,000 Units Subcutaneous Q8H  . hydrALAZINE  25 mg Per Tube Q8H  . insulin aspart  0-20 Units Subcutaneous Q4H  . labetalol  300 mg Per Tube TID  . levothyroxine  100 mcg Per Tube Q0600  . mouth rinse  15 mL Mouth Rinse 10 times per day  . metoCLOPramide  5 mg Per Tube Q6H  . neomycin-bacitracin-polymyxin  1 application Topical Daily  . pantoprazole sodium  40 mg Per Tube Daily  .  polyethylene glycol  17 g Per Tube Daily  . polyvinyl alcohol  1 drop Left Eye BID  . scopolamine  1 patch Transdermal Q72H  . [START ON 03/03/2020] scopolamine  1 patch Transdermal Q72H  . sodium chloride flush  10-40 mL Intracatheter Q12H   Continuous Infusions: .  ceFAZolin (ANCEF) IV    . feeding supplement (OSMOLITE 1.2 CAL) 1,000 mL (02/28/20 0618)     LOS: 29 days     Hosie Poisson, MD [facility]   To contact the attending provider between 7A-7P or the covering provider during after hours 7P-7A, please log into the web site [email or web address] and access using universal [place]  password for that web site. If you do not have the password, please call the hospital operator.  02/28/2020, 12:31 PM

## 2020-02-28 NOTE — Progress Notes (Signed)
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  11/4, pt s/p L far-lateral craniotomy for clipping of PICA aneurysm. 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   OT comments  Pt progressing towards established OT goals. Pt performing functional mobility in hallway with Min A +2 and Max cues for sequencing and RW management. Pt performing grooming at sink with Min A for standing balance; pt fatigues and requiring seated rest break to complete task. Pt continues to present with poor cognition, balance, coordination, and activity tolerance impacting her safe functional performance. Will continue to follow acutely as admitted.   HR 90-100. SpO2 >98% on 4L via trach collar.   Follow Up Recommendations  CIR    Equipment Recommendations  Wheelchair (measurements OT);Wheelchair cushion (measurements OT);Hospital bed    Recommendations for Other Services Rehab consult    Precautions / Restrictions Precautions Precautions: Fall Precaution Comments: trach, wrist restraints Restrictions Weight Bearing Restrictions: No       Mobility Bed Mobility Overal bed mobility: Needs Assistance Bed Mobility: Supine to Sit     Supine to sit: Min assist;HOB elevated     General bed mobility comments: HHA and use of bed rails with HOB elevated to ascend trunk to sit EOB, minA.   Transfers Overall transfer level: Needs assistance   Transfers: Sit to/from Stand Sit to Stand: Min assist         General transfer comment: MinA for steadying and cuing pt for proper hand placement to push up to stand. Extra time to power up to stand.     Balance Overall balance assessment: Needs assistance Sitting-balance support: Feet supported Sitting balance-Leahy Scale: Fair Sitting balance - Comments: Static and dynamic sitting reaching off BOS with min guard for safety,   Standing balance support: During functional activity;Bilateral upper extremity supported;Single extremity supported Standing balance-Leahy Scale: Poor Standing balance comment: Reliant on UE support with min reaching off BOS.                           ADL either performed or assessed with clinical judgement   ADL Overall ADL's : Needs assistance/impaired     Grooming: Min guard;Sitting;Minimal assistance;Standing (shaving) Grooming Details (indicate cue type and reason): Pt performing shaving task at sink. Requiring Min A for standing balance. Pt with fatigue and requiring to sit. Pt completing task while seated.                 Toilet Transfer: Minimal assistance;Ambulation;BSC;RW;+2 for safety/equipment Toilet Transfer Details (indicate cue type and reason): Min A for power up and cues for hand placement.          Functional mobility during ADLs: Minimal assistance;+2 for safety/equipment;Rolling walker General ADL Comments: Pt continues to present with decreased cognition, safety, coorindation, and balance.      Vision   Additional Comments: noted to have a haze to L eye and appears dry. Eyes not aligned   Perception     Praxis      Cognition Arousal/Alertness: Awake/alert Behavior During Therapy: Impulsive Overall Cognitive Status: Impaired/Different from baseline Area of Impairment: Attention;Memory;Following commands;Safety/judgement;Awareness;Problem solving  Current Attention Level: Sustained Memory: Decreased recall of precautions;Decreased short-term memory Following Commands: Follows one step commands with increased time;Follows multi-step commands with increased time;Follows multi-step  commands inconsistently Safety/Judgement: Decreased awareness of safety;Decreased awareness of deficits Awareness: Emergent Problem Solving: Slow processing;Difficulty sequencing;Requires verbal cues;Requires tactile cues General Comments: Pt impulsive with ataxic movements and requires cues to remain on task. Extra time required to process and respoind to cues. Single step commands to sequence turns with RW. Pt very easily - internally and externally.         Exercises     Shoulder Instructions       General Comments HR 90-100 with gait SpO2 >98% throughout    Pertinent Vitals/ Pain       Pain Assessment: No/denies pain Faces Pain Scale: No hurt Pain Intervention(s): Monitored during session  Home Living                                          Prior Functioning/Environment              Frequency  Min 2X/week        Progress Toward Goals  OT Goals(current goals can now be found in the care plan section)  Progress towards OT goals: Progressing toward goals  Acute Rehab OT Goals Patient Stated Goal: to go home OT Goal Formulation: With patient Time For Goal Achievement: 03/07/20 Potential to Achieve Goals: Good ADL Goals Pt Will Perform Grooming: with min assist;sitting Pt Will Perform Upper Body Bathing: with min assist;sitting Pt/caregiver will Perform Home Exercise Program: Increased strength;Increased ROM;Both right and left upper extremity;With minimal assist;With written HEP provided Additional ADL Goal #1: Pt will follow multi-step commands with 75% accuracy and no more than min cues. Additional ADL Goal #2: Pt will perform bed mobility with modA as precursor to EOB/OOB ADL. Additional ADL Goal #3: Pt will maintain static balacne EOB >5 min with minA as precursor to ADL.  Plan Discharge plan remains appropriate    Co-evaluation    PT/OT/SLP Co-Evaluation/Treatment: Yes Reason for Co-Treatment: Complexity of the patient's impairments  (multi-system involvement);For patient/therapist safety;To address functional/ADL transfers PT goals addressed during session: Mobility/safety with mobility;Balance;Proper use of DME OT goals addressed during session: ADL's and self-care      AM-PAC OT "6 Clicks" Daily Activity     Outcome Measure   Help from another person eating meals?: A Lot Help from another person taking care of personal grooming?: A Lot Help from another person toileting, which includes using toliet, bedpan, or urinal?: A Lot Help from another person bathing (including washing, rinsing, drying)?: A Lot Help from another person to put on and taking off regular upper body clothing?: A Lot Help from another person to put on and taking off regular lower body clothing?: A Lot 6 Click Score: 12    End of Session Equipment Utilized During Treatment: Gait belt;Rolling walker;Oxygen  OT Visit Diagnosis: Unsteadiness on feet (R26.81);Muscle weakness (generalized) (M62.81);Other abnormalities of gait and mobility (R26.89) Hemiplegia - Right/Left: Right Hemiplegia - dominant/non-dominant: Dominant Hemiplegia - caused by: Nontraumatic intracerebral hemorrhage   Activity Tolerance Patient tolerated treatment well   Patient Left in chair;with call bell/phone within reach;with chair alarm set;with restraints reapplied   Nurse Communication Mobility status        Time: 1962-2297 OT Time Calculation (min): 39 min  Charges: OT General Charges $OT Visit: 1 Visit OT  Treatments $Self Care/Home Management : 23-37 mins  [place], OTR/L Acute Rehab Pager: [phone] Office: [place] 02/28/2020, 1:31 PM

## 2020-02-28 NOTE — H&P (View-Only) (Signed)
Scheduled for direct laryngoscopy under anesthesia tomorrow morning.

## 2020-02-29 ENCOUNTER — Encounter (HOSPITAL_COMMUNITY): Payer: Self-pay | Admitting: Neurosurgery

## 2020-02-29 ENCOUNTER — Inpatient Hospital Stay (HOSPITAL_COMMUNITY): Payer: Self-pay | Admitting: Anesthesiology

## 2020-02-29 ENCOUNTER — Encounter (HOSPITAL_COMMUNITY)
Admission: EM | Disposition: A | Discharge status: Rehabilitation Facility | Payer: Self-pay | Source: Home / Self Care | Attending: Neurosurgery

## 2020-02-29 HISTORY — PX: DIRECT LARYNGOSCOPY: SHX5326

## 2020-02-29 HISTORY — PX: TRACHEOSTOMY TUBE PLACEMENT: SHX814

## 2020-02-29 LAB — BASIC METABOLIC PANEL
Anion gap: 11 (ref 5–15)
BUN: 9 mg/dL (ref 8–23)
CO2: 29 mmol/L (ref 22–32)
Calcium: 8.8 mg/dL — ABNORMAL LOW (ref 8.9–10.3)
Chloride: 96 mmol/L — ABNORMAL LOW (ref 98–111)
Creatinine, Ser: 0.52 mg/dL (ref 0.44–1.00)
GFR, Estimated: >60 mL/min (ref >60)
Glucose, Bld: 155 mg/dL — ABNORMAL HIGH (ref 70–99)
Potassium: 3.5 mmol/L (ref 3.5–5.1)
Sodium: 136 mmol/L (ref 135–145)

## 2020-02-29 LAB — GLUCOSE, CAPILLARY
Glucose-Capillary: 110 mg/dL — ABNORMAL HIGH (ref 70–99)
Glucose-Capillary: 157 mg/dL — ABNORMAL HIGH (ref 70–99)
Glucose-Capillary: 141 mg/dL — ABNORMAL HIGH (ref 70–99)
Glucose-Capillary: 109 mg/dL — ABNORMAL HIGH (ref 70–99)

## 2020-02-29 LAB — CBC
HCT: 27.9 % — ABNORMAL LOW (ref 36.0–46.0)
Hemoglobin: 8.8 g/dL — ABNORMAL LOW (ref 12.0–15.0)
MCH: 26.7 pg (ref 26.0–34.0)
MCHC: 31.5 g/dL (ref 30.0–36.0)
MCV: 84.8 fL (ref 80.0–100.0)
Platelets: 170 10*3/uL (ref 150–400)
RBC: 3.29 MIL/uL — ABNORMAL LOW (ref 3.87–5.11)
RDW: 19.1 % — ABNORMAL HIGH (ref 11.5–15.5)
WBC: 4.2 10*3/uL (ref 4.0–10.5)
nRBC: 1.2 % — ABNORMAL HIGH (ref 0.0–0.2)

## 2020-02-29 SURGERY — LARYNGOSCOPY, DIRECT
Anesthesia: General

## 2020-02-29 SURGERY — LARYNGOSCOPY, DIRECT
Anesthesia: General | Site: Neck

## 2020-02-29 MED ORDER — PROPOFOL 10 MG/ML IV BOLUS
INTRAVENOUS | Status: DC | PRN
  Administered 2020-02-29: 40 mg via INTRAVENOUS

## 2020-02-29 MED ORDER — LACTATED RINGERS IV SOLN: INTRAVENOUS | Status: DC

## 2020-02-29 MED ORDER — LACTATED RINGERS IV SOLN: INTRAVENOUS | Status: DC | PRN

## 2020-02-29 MED ORDER — MIDAZOLAM HCL 2 MG/2ML IJ SOLN
INTRAMUSCULAR | Status: AC
  Filled 2020-02-29: qty 2

## 2020-02-29 MED ORDER — FENTANYL CITRATE (PF) 250 MCG/5ML IJ SOLN
INTRAMUSCULAR | Status: AC
  Filled 2020-02-29: qty 5

## 2020-02-29 MED ORDER — CHLORHEXIDINE GLUCONATE 0.12 % MT SOLN
OROMUCOSAL | Status: AC
  Filled 2020-02-29: qty 15

## 2020-02-29 MED ORDER — PROPOFOL 10 MG/ML IV BOLUS
INTRAVENOUS | Status: AC
  Filled 2020-02-29: qty 20

## 2020-02-29 MED ORDER — CHLORHEXIDINE GLUCONATE 0.12 % MT SOLN
15.0000 mL | OROMUCOSAL | Status: AC
  Administered 2020-03-01: 15 mL via OROMUCOSAL
  Filled 2020-02-29: qty 15

## 2020-02-29 MED ORDER — 0.9 % SODIUM CHLORIDE (POUR BTL) OPTIME
TOPICAL | Status: DC | PRN
  Administered 2020-02-29: 1000 mL

## 2020-02-29 SURGICAL SUPPLY — 19 items
CANISTER SUCT 3000ML PPV (MISCELLANEOUS) ×3 IMPLANT
COVER BACK TABLE 60X90IN (DRAPES) ×3 IMPLANT
COVER MAYO STAND STRL (DRAPES) ×3 IMPLANT
COVER WAND RF STERILE (DRAPES) ×3 IMPLANT
DRAPE HALF SHEET 40X57 (DRAPES) ×3 IMPLANT
GAUZE 4X4 16PLY RFD (DISPOSABLE) IMPLANT
GLOVE ECLIPSE 7.5 STRL STRAW (GLOVE) ×3 IMPLANT
GUARD TEETH (MISCELLANEOUS) IMPLANT
KIT BASIN OR (CUSTOM PROCEDURE TRAY) ×3 IMPLANT
KIT TURNOVER KIT B (KITS) ×3 IMPLANT
NS IRRIG 1000ML POUR BTL (IV SOLUTION) ×3 IMPLANT
PAD ARMBOARD 7.5X6 YLW CONV (MISCELLANEOUS) ×6 IMPLANT
PATTIES SURGICAL .5 X3 (DISPOSABLE) IMPLANT
SOL ANTI FOG 6CC (MISCELLANEOUS) IMPLANT
SOLUTION ANTI FOG 6CC (MISCELLANEOUS)
TOWEL GREEN STERILE (TOWEL DISPOSABLE) ×3 IMPLANT
TUBE CONNECTING 12X1/4 (SUCTIONS) ×3 IMPLANT
TUBE TRACH  6.0 CUFF FLEX (MISCELLANEOUS) ×1
TUBE TRACH 6.0 CUFF FLEX (MISCELLANEOUS) IMPLANT

## 2020-02-29 NOTE — Progress Notes (Signed)
  NEUROSURGERY PROGRESS NOTE   No issues overnight. Patient to undergo laryngoscopy today. Continue care per TH/ENT. No change in NS plan of care.

## 2020-02-29 NOTE — Progress Notes (Addendum)
PROGRESS NOTE    Stacy Moran  MPN:[id number] DOB: 1956/01/29 DOA: 01/30/2020 PCP: No primary care provider on file.    Chief Complaint  Patient presents with  . Headache    Brief Narrative:   64 year old lady with prior history of hypertension presented on November 3 with a ruptured left P ICA aneurysm that required a craniotomy for aneurysmal repair, s/p tracheostomy and total thyroidectomy on 02/08/2020, s/p G-tube placement.  Patient has self decannulated twice in the past and is currently on soft wrist restraints and a bedside sitter.  ENT Dr. Constance Holster is following the patient.  Dr. Constance Holster with ENT recommended continued speech and swallowing evaluation and therapy and she may need a cuffed tracheostomy tube to protect her airway if there is a concern for aspiration.  Patient continues to have increased secretions due to trach.  CIR on board and continues to follow. Patient seen and examined.  She is underwent laryngoscopy today by ENT. She was found to have significant subglottic swelling especially on the left side and posteriorly.  ENT recommends to continue with cuffed tracheostomy tube in place to protect the airway.   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 Postoperative subarachnoid hemorrhage, followed by neurosurgery. No new recommendations.    Tracheostomy with increased secretions. ENT/Dr. Constance Holster on board, recommended continued speech and swallow evaluation and she may need a cuffed tracheostomy tube to protect her airway if there is concern for aspiration. Pulmonary toilet. Patient currently on scopolamine patch and Robinul to help with secretions.  She is underwent laryngoscopy today by ENT.  She was found to have significant subglottic swelling especially on the left side and posteriorly.  ENT recommends to continue with cuffed tracheostomy tube in place to protect the airway.   Essential hypertension Blood pressure parameters have optimized.  Currently on Norvasc, hydralazine, labetalol. As needed hydralazine on board.  Hypokalemia Replaced    Anemia of chronic disease Hemoglobin stable around 9 Continue to monitor.    History of total thyroidectomy Continue with Synthroid 100 mcg daily.   Dysphagia S/p G-tube placement on 02/22/2020 with tube feeds running. SLP evaluation recommending n.p.o. and that she may use Passy-Muir speech bowel with SLP only.  Meanwhile continue with n.p.o. and tube feeds.  Prediabetes  Hemoglobin A1c at 6.2 patient currently on sliding scale insulin.   Altered mental status with intermittent delirium. CT of the head does not show any acute changes, has stable hematoma and midline shift. Patient is alert and following some commands,.    Pressure injury  Pressure Injury 02/12/20 Throat Mid Stage 2 -  Partial thickness loss of dermis presenting as a shallow open injury with a red, pink wound bed without slough. (Active)  02/12/20 1500  Location: Throat  Location Orientation: Mid  Staging: Stage 2 -  Partial thickness loss of dermis presenting as a shallow open injury with a red, pink wound bed without slough.  Wound Description (Comments):   Present on Admission: No  Wound care consulted and recommendations given.       DVT prophylaxis: Heparin Code Status: Full code Family Communication: None at bedside) Disposition:   Status is: Inpatient  Remains inpatient appropriate because:Unsafe d/c plan and Inpatient level of care appropriate  due to severity of illness   Dispo: The patient is from: Home              Anticipated d/c is to: CIR              Anticipated d/c date is: 2 days              Patient currently is not  medically stable to d/c.       Consultants:   ENT  Neurosurgery  Procedures:  S/p tracheostomy S/p G-tube placement S/p aneurysmal clipping  Antimicrobials: None  Subjective: No new complaints.   Objective: Vitals:   02/29/20 0500 02/29/20 1256 02/29/20 1333 02/29/20 1349  BP:   128/90   Pulse:   76   Resp:   15 18  Temp:   (!) 97.3 F (36.3 C) (!) 97.4 F (36.3 C)  TempSrc:      SpO2:   100% 100%  Weight: 69.1 kg 69.1 kg    Height:  5' 9"  (1.753 m)      Intake/Output Summary (Last 24 hours) at 02/29/2020 1753 Last data filed at 02/29/2020 1700 Gross per 24 hour  Intake 600 ml  Output 500 ml  Net 100 ml   Filed Weights   02/28/20 0414 02/29/20 0500 02/29/20 1256  Weight: 74.3 kg 69.1 kg 69.1 kg    Examination:  General exam: chronically Ill appearing lady s/p trach.  Respiratory system: air entry fair, no wheezing or rhonchi.  Cardiovascular system: S1S2, tachycardic, no JVD.  Gastrointestinal system : soft, non tender non distended. bs+ Central nervous system: alert , following some commands. . Extremities: No pedal edema.  Skin: No rashes seen Psychiatry: Appropriate mood    Data Reviewed: I have personally reviewed following labs and imaging studies  CBC: Recent Labs  Lab 02/23/20 0300 02/24/20 0033 02/25/20 0501 02/26/20 0652 02/29/20 0310  WBC 5.0 2.8* 2.8* 3.9* 4.2  HGB 10.2* 8.5* 8.4* 9.4* 8.8*  HCT 33.5* 27.9* 27.4* 30.6* 27.9*  MCV 84.6 85.1 86.2 84.8 84.8  PLT 292 PLATELET CLUMPS NOTED ON SMEAR, UNABLE TO ESTIMATE 205 257 801    Basic Metabolic Panel: Recent Labs  Lab 02/23/20 0300 02/23/20 0300 02/24/20 0033 02/25/20 0501 02/26/20 0652 02/28/20 1150 02/29/20 0310  NA 142   < > 141 139 136 137 136  K 3.1*   < > 3.7 3.7 3.1* 4.1 3.5  CL 106   < > 109 108 102 96* 96*  CO2 24   < > 21* 22 26 31 29   GLUCOSE 157*   < > 120* 95 130* 113* 155*  BUN <5*   < > 11 12 6* 10 9  CREATININE 0.62   < > 0.58 0.49 0.54 0.60 0.52   CALCIUM 8.5*   < > 8.0* 7.8* 8.1* 8.6* 8.8*  MG 2.1  --  2.0 2.0 1.9  --   --    < > = values in this interval not displayed.    GFR: Estimated Creatinine Clearance: 76.2 mL/min (by C-G formula based on SCr of 0.52 mg/dL).  Liver Function Tests: No results for input(s): AST, ALT, ALKPHOS, BILITOT, PROT, ALBUMIN in the last 168 hours.  CBG: Recent Labs  Lab 02/28/20 1607 02/28/20 2153 02/29/20 0453 02/29/20 0746 02/29/20 1640  GLUCAP 117* 159* 110* 157* 141*     No results found for this or any previous visit (from the past 240 hour(s)).       Radiology Studies: No results found.  Scheduled Meds: . amLODipine  10 mg Per Tube Daily  . chlorhexidine  15 mL Mouth/Throat NOW  . chlorhexidine gluconate (MEDLINE KIT)  15 mL Mouth Rinse BID  . Chlorhexidine Gluconate Cloth  6 each Topical Daily  . docusate  100 mg Per Tube BID  . feeding supplement (PROSource TF)  45 mL Per Tube BID  . glycopyrrolate  1 mg Per Tube TID  . heparin  5,000 Units Subcutaneous Q8H  . hydrALAZINE  25 mg Per Tube Q8H  . insulin aspart  0-20 Units Subcutaneous Q4H  . labetalol  300 mg Per Tube TID  . levothyroxine  100 mcg Per Tube Q0600  . mouth rinse  15 mL Mouth Rinse 10 times per day  . metoCLOPramide  5 mg Per Tube Q6H  . pantoprazole sodium  40 mg Per Tube Daily  . polyethylene glycol  17 g Per Tube Daily  . polyvinyl alcohol  1 drop Left Eye BID  . [START ON 03/03/2020] scopolamine  1 patch Transdermal Q72H  . sodium chloride flush  10-40 mL Intracatheter Q12H   Continuous Infusions: .  ceFAZolin (ANCEF) IV    . feeding supplement (OSMOLITE 1.2 CAL) 1,000 mL (02/28/20 0618)  . lactated ringers 10 mL/hr at 02/29/20 1257     LOS: 30 days     Hosie Poisson, MD [facility]   To contact the attending provider between 7A-7P or the covering provider during after hours 7P-7A, please log into the web site [email or web address] and access using universal [place] password for  that web site. If you do not have the password, please call the hospital operator.  02/29/2020, 5:53 PM

## 2020-02-29 NOTE — Op Note (Signed)
OPERATIVE REPORT  DATE OF SURGERY: 02/29/2020  PATIENT:  Stacy Moran,  64 y.o. female  PRE-OPERATIVE DIAGNOSIS:  Tracheostomy dependent  POST-OPERATIVE DIAGNOSIS: Same  PROCEDURE:  Procedure(s): DIRECT LARYNGOSCOPY  SURGEON:  Beckie Salts, MD  ASSISTANTS: none  ANESTHESIA:   General   EBL: Minimal ml  DRAINS: none  LOCAL MEDICATIONS USED:  None  SPECIMEN: None  COUNTS:  Correct  PROCEDURE DETAILS: The patient was taken to the operating room and placed on the operating table in the supine position. Following induction of intravenous sedation the tracheostomy tube was replaced with a fresh cuffed flexible #6 tracheostomy..  The patient was positioned for endoscopy.  A maxillary tooth protector was used.  An anterior commissure scope was used to evaluate the larynx.  The cords were thickened and swollen.  There is significant subglottic swelling especially on the left side and posteriorly.  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  As the anesthesia was wearing off the cords were inspected.  Deep suctioning through the tracheostomy was used to elicit a cough.  There definitely seem to be movement of the right cord.  The left cord may be paretic but it is a little hard to tell.  The endoscope was removed.  Care of the patient was handed to anesthesia for awakening from sedation.  Based on today's findings, I think this is going to be a long process.  For now I think we should keep the cuffed tracheostomy tube in place to protect the airway.  Over the next several months we can continue evaluation to see how the subglottic airway grasses.  She may need future procedures to open the subglottis if necessary.  We will continue to monitor the vocal cord mobility as well.    PATIENT DISPOSITION:  To PACU, stable

## 2020-02-29 NOTE — Interval H&P Note (Signed)
History and Physical Interval Note:  02/29/2020 12:18 PM  Stacy Moran  has presented today for surgery, with the diagnosis of Tracheostomy Status.  The various methods of treatment have been discussed with the patient and family. After consideration of risks, benefits and other options for treatment, the patient has consented to  Procedure(s): DIRECT LARYNGOSCOPY (N/A) as a surgical intervention.  The patient's history has been reviewed, patient examined, no change in status, stable for surgery.  I have reviewed the patient's chart and labs.  Questions were answered to the patient's satisfaction.     Stacy Moran

## 2020-02-29 NOTE — Anesthesia Postprocedure Evaluation (Signed)
Anesthesia Post Note  Patient: Stacy Moran  Procedure(s) Performed: DIRECT LARYNGOSCOPY (N/A Mouth) TRACHEOSTOMY EXCHANGE (N/A Neck)     Patient location during evaluation: PACU Anesthesia Type: General Level of consciousness: responds to stimulation Pain management: pain level controlled Vital Signs Assessment: post-procedure vital signs reviewed and stable Respiratory status: spontaneous breathing, nonlabored ventilation, respiratory function stable and patient connected to tracheostomy mask oxygen Cardiovascular status: stable Postop Assessment: no apparent nausea or vomiting Anesthetic complications: no   No complications documented.  Last Vitals:  Vitals:   02/29/20 1333 02/29/20 1349  BP: 128/90   Pulse: 76   Resp: 15 18  Temp: (!) 36.3 C (!) 36.3 C  SpO2: 100% 100%    Last Pain:  Vitals:   02/29/20 1333  TempSrc:   PainSc: Asleep                 Anders Hohmann

## 2020-02-29 NOTE — Transfer of Care (Signed)
Immediate Anesthesia Transfer of Care Note  Patient: Stacy Moran  Procedure(s) Performed: DIRECT LARYNGOSCOPY (N/A Mouth) TRACHEOSTOMY EXCHANGE (N/A Neck)  Patient Location: PACU  Anesthesia Type:General  Level of Consciousness: drowsy and patient cooperative  Airway & Oxygen Therapy: Patient Spontanous Breathing and Patient connected to T-piece oxygen  Post-op Assessment: Report given to RN and Post -op Vital signs reviewed and stable  Post vital signs: Reviewed and stable  Last Vitals:  Vitals Value Taken Time  BP 128/90 02/29/20 1333  Temp 36.3 C 02/29/20 1333  Pulse 75 02/29/20 1337  Resp 19 02/29/20 1337  SpO2 100 % 02/29/20 1337  Vitals shown include unvalidated device data.  Last Pain:  Vitals:   02/29/20 0715  TempSrc:   PainSc: Asleep      Patients Stated Pain Goal: 3 (50/03/70 4888)  Complications: No complications documented.

## 2020-02-29 NOTE — Progress Notes (Signed)
Was notified by RN that tube feeds had not been turned off. Spoke to Dr. Fransisco Beau who informed me that surgery is delayed for at least 6 hours once tube feeds are turned off. Informed OR desk as well.

## 2020-02-29 NOTE — Anesthesia Preprocedure Evaluation (Addendum)
Anesthesia Evaluation  Patient identified by MRN, date of birth, ID band Patient confused    Reviewed: Allergy & Precautions, Patient's Chart, lab work & pertinent test results, Unable to perform ROS - Chart review only  History of Anesthesia Complications Negative for: history of anesthetic complications  Airway Mallampati: Trach       Dental   Pulmonary  S/p tracheostomy  Covid-19 Nucleic Acid Test Results Lab Results      Component                Value               Date                      [place]              NEGATIVE            01/30/2020               + decreased breath sounds  (-) rales    Cardiovascular hypertension,  Rhythm:Regular     Neuro/Psych  Cerebral aneurysm s/p clipping  negative psych ROS   GI/Hepatic negative GI ROS, Neg liver ROS,   Endo/Other  Hypothyroidism  Pre-DM   Renal/GU negative Renal ROSLab Results      Component                Value               Date                      CREATININE               0.52                02/29/2020                Musculoskeletal negative musculoskeletal ROS (+)   Abdominal   Peds  Hematology  (+) Blood dyscrasia, anemia , Lab Results      Component                Value               Date                      WBC                      4.2                 02/29/2020                HGB                      8.8 (L)             02/29/2020                HCT                      27.9 (L)            02/29/2020                MCV                      84.8  02/29/2020                PLT                      170                 02/29/2020              Anesthesia Other Findings Covid test negative 01/30/20 (inpatient since)   Reproductive/Obstetrics                           Anesthesia Physical  Anesthesia Plan  ASA: IV  Anesthesia Plan: General   Post-op Pain Management:    Induction: Inhalational  PONV  Risk Score and Plan: 3 and Treatment may vary due to age or medical condition and Ondansetron  Airway Management Planned: Tracheostomy  Additional Equipment: None  Intra-op Plan:   Post-operative Plan:   Informed Consent:     History available from chart only  Plan Discussed with: CRNA and Surgeon  Anesthesia Plan Comments:       Anesthesia Quick Evaluation                                  Anesthesia Evaluation  Patient identified by MRN, date of birth, ID band Patient unresponsive    Reviewed: Patient's Chart, lab work & pertinent test results, Unable to perform ROS - Chart review onlyPreop documentation limited or incomplete due to emergent nature of procedure.  History of Anesthesia Complications Negative for: history of anesthetic complications  Airway Mallampati: Intubated       Dental   Pulmonary  Intubated already for MS decline with large SAH, PICA rupture 01/30/2020 SARS coronavirus NEG   breath sounds clear to auscultation       Cardiovascular  Rhythm:Regular Rate:Tachycardia  unknown   Neuro/Psych Ruptured cerebral aneurysm: large SAH, with PICA rupture, has ventric now     GI/Hepatic unknown   Endo/Other  unknown  Renal/GU      Musculoskeletal   Abdominal   Peds  Hematology   Anesthesia Other Findings   Reproductive/Obstetrics                             Anesthesia Physical  Anesthesia Plan  ASA: IV and emergent  Anesthesia Plan: General   Post-op Pain Management:    Induction: Inhalational  PONV Risk Score and Plan: 3 and Treatment may vary due to age or medical condition  Airway Management Planned: Oral ETT  Additional Equipment: Arterial line  Intra-op Plan:   Post-operative Plan: Post-operative intubation/ventilation  Informed Consent:     Only emergency history available  Plan Discussed with: CRNA and Surgeon  Anesthesia Plan Comments: (No family available, pt  intubated, Dr. Kathyrn Sheriff verifies emergency)        Anesthesia Quick Evaluation

## 2020-03-01 ENCOUNTER — Encounter (HOSPITAL_COMMUNITY): Payer: Self-pay | Admitting: Otolaryngology

## 2020-03-01 ENCOUNTER — Inpatient Hospital Stay (HOSPITAL_COMMUNITY): Payer: Self-pay

## 2020-03-01 DIAGNOSIS — R001 Bradycardia, unspecified: Secondary | ICD-10-CM

## 2020-03-01 DIAGNOSIS — J9621 Acute and chronic respiratory failure with hypoxia: Secondary | ICD-10-CM

## 2020-03-01 LAB — GLUCOSE, CAPILLARY
Glucose-Capillary: 111 mg/dL — ABNORMAL HIGH (ref 70–99)
Glucose-Capillary: 113 mg/dL — ABNORMAL HIGH (ref 70–99)
Glucose-Capillary: 124 mg/dL — ABNORMAL HIGH (ref 70–99)
Glucose-Capillary: 126 mg/dL — ABNORMAL HIGH (ref 70–99)
Glucose-Capillary: 133 mg/dL — ABNORMAL HIGH (ref 70–99)
Glucose-Capillary: 134 mg/dL — ABNORMAL HIGH (ref 70–99)
Glucose-Capillary: 161 mg/dL — ABNORMAL HIGH (ref 70–99)
Glucose-Capillary: 65 mg/dL — ABNORMAL LOW (ref 70–99)
Glucose-Capillary: 92 mg/dL (ref 70–99)

## 2020-03-01 LAB — CBC
HCT: 24.8 % — ABNORMAL LOW (ref 36.0–46.0)
Hemoglobin: 8.1 g/dL — ABNORMAL LOW (ref 12.0–15.0)
MCH: 27.1 pg (ref 26.0–34.0)
MCHC: 32.7 g/dL (ref 30.0–36.0)
MCV: 82.9 fL (ref 80.0–100.0)
Platelets: 177 10*3/uL (ref 150–400)
RBC: 2.99 MIL/uL — ABNORMAL LOW (ref 3.87–5.11)
RDW: 18.6 % — ABNORMAL HIGH (ref 11.5–15.5)
WBC: 7 10*3/uL (ref 4.0–10.5)
nRBC: 0.4 % — ABNORMAL HIGH (ref 0.0–0.2)

## 2020-03-01 LAB — BASIC METABOLIC PANEL
Anion gap: 12 (ref 5–15)
BUN: 10 mg/dL (ref 8–23)
CO2: 31 mmol/L (ref 22–32)
Calcium: 8.6 mg/dL — ABNORMAL LOW (ref 8.9–10.3)
Chloride: 94 mmol/L — ABNORMAL LOW (ref 98–111)
Creatinine, Ser: 0.51 mg/dL (ref 0.44–1.00)
GFR, Estimated: >60 mL/min (ref >60)
Glucose, Bld: 97 mg/dL (ref 70–99)
Potassium: 4.3 mmol/L (ref 3.5–5.1)
Sodium: 137 mmol/L (ref 135–145)

## 2020-03-01 LAB — MRSA PCR SCREENING: MRSA by PCR: NEGATIVE

## 2020-03-01 LAB — MAGNESIUM: Magnesium: 2.2 mg/dL (ref 1.7–2.4)

## 2020-03-01 LAB — PHOSPHORUS: Phosphorus: 3.6 mg/dL (ref 2.5–4.6)

## 2020-03-01 MED ORDER — QUETIAPINE FUMARATE 50 MG PO TABS
50.0000 mg | ORAL_TABLET | Freq: Every day | ORAL | Status: DC
  Administered 2020-03-01: 50 mg via ORAL
  Filled 2020-03-01: qty 1

## 2020-03-01 MED ORDER — DEXTROSE 50 % IV SOLN
INTRAVENOUS | Status: AC
  Administered 2020-03-01: 25 mL
  Filled 2020-03-01: qty 50

## 2020-03-01 MED ORDER — ORAL CARE MOUTH RINSE
15.0000 mL | OROMUCOSAL | Status: DC
  Administered 2020-03-01 – 2020-03-17 (×144): 15 mL via OROMUCOSAL

## 2020-03-01 MED ORDER — CHLORHEXIDINE GLUCONATE 0.12% ORAL RINSE (MEDLINE KIT)
15.0000 mL | Freq: Two times a day (BID) | OROMUCOSAL | Status: DC
  Administered 2020-03-01 – 2020-04-22 (×97): 15 mL via OROMUCOSAL

## 2020-03-01 MED ORDER — CHLORHEXIDINE GLUCONATE CLOTH 2 % EX PADS
6.0000 | MEDICATED_PAD | Freq: Every day | CUTANEOUS | Status: DC
  Administered 2020-03-02 – 2020-04-22 (×49): 6 via TOPICAL

## 2020-03-01 NOTE — Progress Notes (Signed)
Code blue re: airway difficulty, bradycardia, desaturation.  Inner cannula change attempted with some resistance.  BMV, lavage, suction largely resolved obstruction as well as emergent concerns.  Large amount of secretions suctioned, but no mucous plugs noted.  Transported to ICU for observation and mechanical ventilation to allow patient to rest.  CCM used bronchoscope to examine lower airways.  Per CCM nothing particularly alarming or remarkable other than copious secretions.    Stoma site noted to be unusually large.  Trach care performed, inner cannula changed again.  Pt initially placed on PS/CPAP but transitioned to [facility] when pt began resting.  At this time trach is clean, patent, secure.  Ventilator settings as documented.  BMV and extra trach equipment at bedside.  Will continue to monitor/assess per protocol.

## 2020-03-01 NOTE — Progress Notes (Signed)
[facility] Progress Note Patient Name: Stacy Moran DOB: 07-21-1955 MRN: [id number]   Date of Service  03/01/2020  HPI/Events of Note    eICU Interventions  Wound culture of tracheal wound ordered.        Kerry Kass Camile Esters 03/01/2020, 4:48 AM

## 2020-03-01 NOTE — Progress Notes (Signed)
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    Past Medical History  Called to see patient due to bradycardia that improved with tracheal suctioning.  Patient with copious secretions unable to be adequately suctioned by nursing on the floor.  Patient transferred to ICU for observation, mechanical ventilation and pulmonary toilet.    Significant Hospital Events   11/03 Admitted 11/04 craniotomy with clipping of left PICA 11/12 total thyroidectomy, tracheostomy 11/13 start ABx for fever and tracheobronchitis 11/16 Now 11 days post clipping.  Drain stopped yesterday so left clamped.  Was interactive with PT yesterday. This morning, agitated and complaining of back pain. More somnolent following fentanyl this morning. Stable from neuro check  11/21 pulm called back again;  trach bleeding. we called ENT 11/23 subglottic obstruction which is new observed on MBS-->CT neck ordered to r/o infection. Also abx started. As she failed MBS IR consulted for PEG. Should be last day for nimodipine. We asked IM to assist w. Care  12/4:  Patient with episopde of bradycardia, lots of secretions, desaturation presumably secondary to same.  Not ventilated, transferred to ICU and place on mechanical ventilation, bronch and provide adequate pulmonary toilet.  Tracheostomy has large deep inferior wound as documented. 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 Stable report of oozing from excoriations from trach site.  Objective   Blood pressure (!) 145/84, pulse 94, temperature 97.9 F (36.6 C), temperature source Axillary, resp. rate (!) 21, height 5\' 9"  (1.753 m), weight 69.1 kg, SpO2 100 %.    FiO2 (%):  [21 %-28 %] 28 %   Intake/Output Summary (Last 24 hours) at 03/01/2020 0140 Last data filed at 02/29/2020 2300 Gross per 24 hour  Intake 600 ml  Output 1600 ml  Net -1000 ml   Filed Weights   02/28/20 0414 02/29/20 0500 02/29/20 1256  Weight: 74.3 kg 69.1 kg 69.1 kg    Examination:  General this is a 64 year old female resting in bed. She is in no distress. Did just have attempted MBS where she has significant upper airway obstruction.  HENT #6 cuffless trach. Copious foul smelling thick secretions from trach, trach wound large and deep mostly inferior with purulent secretions pulm course scattered rhonchi Card RRR abd soft not tender Ext warm and dry  Neuro awake and moves all ext    Resolved Hospital Problem list    Tracheobronchitis (Resolved) Beta lactamase + H flu, Citrobacter and S.aureus are susceptible.: Patient completed 7 days treatment with ceftriaxone Trach site bleeding  11/22  consult for bleeding around trach, TXA and epi nebs ordered per prelim recs   Assessment & Plan:    Episode of bradycardia, responded to deep suctioning Patient urgently bronched once in ICU, attempted to contact daughter, no answer.  Found lots of thin secretions, no mucous plugging .   Will continue suctioning through the night while ventilated.  Patient could be trialed on t collar ain am and moved back out if deemed appropriate.  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  C/b poly-microbial tracheobronchitis and what appears to be on going sub-glottic obstruction (observed by MBS films) not sure if this is post-surgical change, old hematoma OR acute/subacute infection.  -has copious tracheal secretions Area of trach wound cultured. Plan No change in current trach management  Continue current RX   Dysphagia Plan Will need PEG Consulted IR    HTN Plan Cont labetolol , losartan, and norvasc  Diabetes Plan Cont ssi and lantus    Over 35 minutes spent in bedside evaluation, chart review and critical care planning.

## 2020-03-01 NOTE — Progress Notes (Signed)
eLink Physician-Brief Progress Note Patient Name: Stacy Moran DOB: 1956-01-05 MRN: [id number]   Date of Service  03/01/2020  HPI/Events of Note  Patient originally admitted for large SAH from ruptured PICA aneurysm, had aneurysm clipped, and had a prolonged post-op course resulting in tracheostomy, was transferred to the ICU tonight secondary to respiratory distress and resultant bradycardia, patient was bronched on arrival and copious secretions evacuated from the airway. Respiratory distress and bradycardia have markedly improved following therapeutic bronchoscopy.  eICU Interventions  New Patient Evaluation completed.        Kerry Kass Ogan 03/01/2020, 2:42 AM

## 2020-03-01 NOTE — Progress Notes (Signed)
[facility] Progress Note Patient Name: Stacy Moran DOB: 1955-12-07 MRN: [id number]   Date of Service  03/01/2020  HPI/Events of Note  Code blue called for this patient, the room has no camera.  eICU Interventions  PCCM [employer] notified and asked to go and see the patient.        Kerry Kass Leeza Heiner 03/01/2020, 1:31 AM

## 2020-03-01 NOTE — Progress Notes (Addendum)
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    Past Medical History  Transferred to ICU from general floor overnight due to bradycardia. Improved with trachel suctioning of copious secretions. Bronchoscopy showing copious secretions but other etiology for blockage  Significant Hospital Events   11/03 Admitted 11/04 craniotomy with clipping of left PICA 11/12 total thyroidectomy, tracheostomy 11/13 start ABx for fever and tracheobronchitis 11/16 Now 11 days post clipping.  Drain stopped yesterday so left clamped.  Was interactive with PT yesterday. This morning, agitated and complaining of back pain. More somnolent following fentanyl this morning. Stable from neuro check  11/21 pulm called back again;  trach bleeding. we called ENT 11/23 subglottic obstruction which is new observed on MBS-->CT neck ordered to r/o infection. Also abx started. As she failed MBS IR consulted for PEG. Should be last day for nimodipine. We asked IM to assist w. Care  12/4:  Put on mechanical ventilation and transferred to ICU  Consults:  ENT (Dr.Rosen) Neuro surgery  Procedures:  11/03 ETT >>11/09 11/03 EVD >> 11/09 ETT >> 11/12 11/12 Tracheostomy >>  Significant Diagnostic Tests:  11/3 CT head > large volume subarachnoid related to ruptured left PICA aneurysm, intraventricular reflux with hydrocephalus, chronic small vessel disease, chronic small vessel disease 11/9 H&N U/S > 2.3x2.3x2.4 mass in the left neck along the left thyroid lobe 11/27 CT head > Resolution of subarachnoid hemorrhage, stable subdural hematoma 11/29 CT head > Unchanged size of right subdural hematoma with minimal leftward midline shift  Micro Data:  11/03 SARS2/ Flu > neg 11/08  respiratory culture >> negative 11/08 blood culture >>negative 11/11 urine culture >> multiple species 11/12 MRSA PCR >> negative 11/12 sputum: staph aureus, Citrobacter and Haemophilus parainfluenza (B lactamase +)  11/22 sputum >> multiple organism, no staph or strep 12/4 Sputum>>  Antimicrobials:  Zosyn 11/13 >> 11/14 Ceftriaxone 11/14 - 11/20  Interim history/subjective:  O/N event: Transferred to ICU  Ms.Schum was examined and evaluated at bedside this am. She was noted to be restless but opens eyes spontaneously and mouths words in response. Bedside nursing staff noted history of self-decannulations and requests continuing sitter.  Objective   Blood pressure (!) 164/78, pulse 74, temperature 98.4 F (36.9 C), temperature source Axillary, resp. rate (!) 21, height 5\' 9"  (1.753 m), weight 66.2 kg, SpO2 100 %.    Vent Mode: PRVC FiO2 (%):  [28 %-40 %] 40 % Set Rate:  [18 bmp] 18 bmp Vt Set:  [400 mL] 400 mL PEEP:  [5 cmH20] 5 cmH20 Plateau Pressure:  [15 cmH20] 15 cmH20   Intake/Output Summary (Last 24 hours) at 03/01/2020 1117 Last data filed at 02/29/2020 2300 Gross per 24 hour  Intake 600 ml  Output 1600 ml  Net -1000 ml   Filed Weights   02/29/20 0500 02/29/20 1256 03/01/20 0600  Weight: 69.1 kg 69.1 kg 66.2 kg    Examination: Gen: Well-developed, chronically ill-appearing HEENT: NCAT head, trach collar intact and on mechnical venitllation CV: RRR, s1s2 wnl Pulm: CTAB, no rales, no wheezes Extm: ROM intact, No peripheral edema Skin: Dry, Warm, normal turgor Neuro: Alert, able to follow directions, mouths answers in response to questions  Resolved Hospital Problem list    Tracheobronchitis (Resolved) Beta lactamase + H  flu, Citrobacter and S.aureus are susceptible.: Patient completed 7 days treatment with ceftriaxone Trach site bleeding  11/22 consult for bleeding around trach, TXA and epi nebs ordered per prelim recs   Assessment & Plan:  #Episode of  bradycardia, responded to deep suctioning #Trach-dependence # Hx of poly-microbial tracheobronchitis Transferred overnight due to bradycardia, desaturation requiring mechanical ventilation. Bronch performed at bedside overnight with copious secretions.  Presumed to be due to complication regarding sputum clearance. Possibly aspiration event. FiO2 stable. Possibly wean off mechanical ventilation today. 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 Off nimodipine. Follow up imaging shows resolution.  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  #Hypothyroidism  Stable - C/w levothyroxine 164mcg daily  #Dysphagia - s/p PEG  #HTN Am bp 164/78 - C/w labetalol, hydralazine, amlodipine,   #Diabetes Am cbg 92 - c/w ssi and lantus   Best practice (evaluated daily)   Diet: PEG Pain/Anxiety/Delirium protocol (if indicated): haldol prn VAP protocol (if indicated): Not on abx DVT prophylaxis: subqhep GI prophylaxis: PPI Glucose control: SSI Mobility: N/A last date of multidisciplinary goals of care discussion: unknown Family and staff present N/A Summary of discussion N/A Follow up goals of care discussion due today Code Status: Full Disposition: Possible transfer to progressive today  Labs   CBC: Recent Labs  Lab 02/24/20 0033 02/25/20 0501 02/26/20 0652 02/29/20 0310  WBC 2.8* 2.8* 3.9* 4.2  HGB 8.5* 8.4* 9.4* 8.8*  HCT 27.9* 27.4* 30.6* 27.9*  MCV 85.1 86.2 84.8 84.8  PLT PLATELET CLUMPS NOTED ON SMEAR, UNABLE TO ESTIMATE 205 257 944    Basic Metabolic Panel: Recent Labs  Lab 02/24/20 0033 02/25/20 0501 02/26/20 0652 02/28/20 1150  02/29/20 0310  NA 141 139 136 137 136  K 3.7 3.7 3.1* 4.1 3.5  CL 109 108 102 96* 96*  CO2 21* 22 26 31 29   GLUCOSE 120* 95 130* 113* 155*  BUN 11 12 6* 10 9  CREATININE 0.58 0.49 0.54 0.60 0.52  CALCIUM 8.0* 7.8* 8.1* 8.6* 8.8*  MG 2.0 2.0 1.9  --   --    GFR: Estimated Creatinine Clearance: 76.2 mL/min (by C-G formula based on SCr of 0.52 mg/dL). Recent Labs  Lab 02/24/20 0033 02/25/20 0501 02/26/20 0652 02/29/20 0310  WBC 2.8* 2.8* 3.9* 4.2    Liver Function Tests: No results for input(s): AST, ALT, ALKPHOS, BILITOT, PROT, ALBUMIN in the last 168 hours. No results for input(s): LIPASE, AMYLASE in the last 168 hours. No results for input(s): AMMONIA in the last 168 hours.  ABG    Component Value Date/Time   PHART 7.444 02/06/2020 0843   PCO2ART 42.8 02/06/2020 0843   PO2ART 157 (H) 02/06/2020 0843   HCO3 29.2 (H) 02/06/2020 0843   TCO2 30 02/06/2020 0843   ACIDBASEDEF 4.0 (H) 01/31/2020 0113   O2SAT 99.0 02/06/2020 0843     Coagulation Profile: No results for input(s): INR, PROTIME in the last 168 hours.  Cardiac Enzymes: No results for input(s): CKTOTAL, CKMB, CKMBINDEX, TROPONINI in the last 168 hours.  HbA1C: Hgb A1c MFr Bld  Date/Time Value Ref Range Status  01/30/2020 08:59 AM 6.2 (H) 4.8 - 5.6 % Final  Comment:    (NOTE) Pre diabetes:          5.7%-6.4%  Diabetes:              >6.4%  Glycemic control for   <7.0% adults with diabetes     CBG: Recent Labs  Lab 02/29/20 2016 03/01/20 0004 03/01/20 0158 03/01/20 0335 03/01/20 0430  GLUCAP 109* 161* 124* 65* 113*    Review of Systems:   Unable to assess  Past Medical History  She,  has no past medical history on file.   Surgical History    Past Surgical History:  Procedure Laterality Date  . CRANIOTOMY Left 01/30/2020   Procedure: LEFT FAR LATERAL CRANIOTOMY FOR ANEURYSM CLIPPING;  Surgeon: [name], MD;  Location: [place];  Service: Neurosurgery;  Laterality: Left;  .  DIRECT LARYNGOSCOPY N/A 02/29/2020   Procedure: DIRECT LARYNGOSCOPY;  Surgeon: [name], MD;  Location: [facility];  Service: ENT;  Laterality: N/A;  . IR ANGIO INTRA EXTRACRAN SEL INTERNAL CAROTID BILAT MOD SED  01/30/2020  . IR ANGIO VERTEBRAL SEL VERTEBRAL UNI L MOD SED  01/30/2020  . IR GASTROSTOMY TUBE MOD SED  02/22/2020  . RADIOLOGY WITH ANESTHESIA N/A 01/30/2020   Procedure: IR WITH ANESTHESIA;  Surgeon: [name], MD;  Location: [place];  Service: Radiology;  Laterality: N/A;  . THYROIDECTOMY N/A 02/08/2020   Procedure: THYROIDECTOMY;  Surgeon: [name], MD;  Location: [place];  Service: ENT;  Laterality: N/A;  . TRACHEOSTOMY TUBE PLACEMENT N/A 02/08/2020   Procedure: TRACHEOSTOMY;  Surgeon: [name], MD;  Location: [place];  Service: ENT;  Laterality: N/A;  . TRACHEOSTOMY TUBE PLACEMENT N/A 02/29/2020   Procedure: TRACHEOSTOMY EXCHANGE;  Surgeon: [name], MD;  [facility];  Service: ENT;  Laterality: N/A;     Social History   Unknown  Family History   Her family history is not on file.   Allergies No Known Allergies   Home Medications  Prior to Admission medications   Not on File   Mosetta Anis, MD 03/01/2020, 6:45 AM PGY-3, [place] [facility] Pager: [phone]    Pulmonary critical care attending:  64 year old female with a past medical history of SAH status post clipping of a ruptured left posterior circulation aneurysm.  Patient ultimately requiring tracheostomy tube for chronic hypoxemic respiratory failure.  Also had polymicrobial tracheobronchitis in the past.  Last night the patient had a bradycardic episode with deep suctioning felt to be related to mucous plugging.  She was hypoxemic and transferred to the intensive care unit.  She underwent bronchoscopy.  BP 123/69   Pulse 72   Temp 99.5 F (37.5 C) (Oral)   Resp 18   Ht 5\' 9"  (1.753 m)   Wt 66.2 kg   SpO2 100%   BMI 21.55 kg/m   General: Elderly female resting in bed  on mechanical ventilation. HEENT: Tracheostomy tube in place, clean dry and intact no significant secretions or bleeding Heart: Regular rhythm and S1-S2 Lungs: Clear to auscultation bilaterally no crackles no wheeze, biomechanically ventilated breath sounds  Labs: Reviewed Chest x-ray: Clear bilaterally no infiltrate.  Assessment: Bradycardic episode, respiratory failure related mucous plugging, resolved Subarachnoid hemorrhage status post clipping of ruptured left PICA aneurysm Thyroid mass Acute on chronic hypoxemic respiratory failure secondary to mucous plugging, tracheostomy tube in place. Airway obstruction complicated by large goiter in the past. Mucous plugging. Agitation   Plan: Wean from FiO2 and PEEP as tolerated from ventilator. Once more stable after  this acute event can try to transition to trach collar trial again. Continue nimodipine Due to concern of mucous plugging and now this recurrent event would lead to concern of decannulation until she has shown a more significant recovery. She will need PEG tube placement at some point. Ongoing agitation seems to be a problem.  We will start Seroquel nightly.  She is currently getting as needed Haldol can continue this for now.  This patient is critically ill with multiple organ system failure; which, requires frequent high complexity decision making, assessment, support, evaluation, and titration of therapies. This was completed through the application of advanced monitoring technologies and extensive interpretation of multiple databases. During this encounter critical care time was devoted to patient care services described in this note for 42 minutes.   Garner Nash, DO [facility] 03/01/2020 1:29 PM

## 2020-03-01 NOTE — Progress Notes (Signed)
0120: RT in to suction pt & perform trach care when she called primary RN to bedside & stated pt O2 sats were very low. Pt was noted to be unresponsive, though regular pulse was noted on monitor. RT stated pt became briefly bradycardic and RT called for code blue. When emergency personnel arrived, pt opened eyes and signaled with a thumbs up that she was aware of surroundings. Pt never lost pulse & code blue cancelled. Due to the fact that pt has copious secretions and difficulty clearing airway, decision was made to move her to ICU for closer monitoring.

## 2020-03-01 NOTE — Code Documentation (Addendum)
  Patient Name: Stacy Moran   MRN: [id number]   Date of Birth/ Sex: [birth year] , female      Admission Date: 01/30/2020  Attending Provider: Hosie Poisson, MD  Primary Diagnosis: Ruptured cerebral aneurysm [facility])   Indication: Pt was in her usual state of health until this AM, when she was noted to be aspirating from secretions. Code blue was subsequently called. At the time of arrival on scene, ACLS protocol was underway.   Technical Description:  - CPR performance duration:  None   - Was defibrillation or cardioversion used? No   - Was external pacer placed? No  - Was patient intubated pre/post CPR? No   Medications Administered: Y = Yes; Blank = No Amiodarone    Atropine    Calcium    Epinephrine    Lidocaine    Magnesium    Norepinephrine    Phenylephrine    Sodium bicarbonate    Vasopressin     Post CPR evaluation:  - Final Status - Was patient successfully resuscitated ? Yes - What is current rhythm? NSR - What is current hemodynamic status? Stable  Miscellaneous Information:  - Labs sent, including: None  - Primary team notified?  Yes  - Family Notified? Attempted to call daughter x2 but no answer. Primary team will notify family.  - Additional notes/ transfer status:  Patient was noted to have increasing secretions and became bradycardic with suctioning. ROSC achieved with ventilations alone, no CPR conducted and no medications given. PCCM physician arrived at bedside, updated, and agreed with transfer to medical ICU. Patient is now in medical ICU.      Virl Axe, MD  03/01/2020, 1:51 AM

## 2020-03-02 LAB — GLUCOSE, CAPILLARY
Glucose-Capillary: 118 mg/dL — ABNORMAL HIGH (ref 70–99)
Glucose-Capillary: 147 mg/dL — ABNORMAL HIGH (ref 70–99)
Glucose-Capillary: 92 mg/dL (ref 70–99)
Glucose-Capillary: 160 mg/dL — ABNORMAL HIGH (ref 70–99)
Glucose-Capillary: 111 mg/dL — ABNORMAL HIGH (ref 70–99)

## 2020-03-02 MED ORDER — ONDANSETRON HCL 4 MG/5ML PO SOLN
4.0000 mg | Freq: Four times a day (QID) | ORAL | Status: DC | PRN
  Administered 2020-03-08: 4 mg
  Filled 2020-03-02 (×2): qty 5

## 2020-03-02 MED ORDER — QUETIAPINE FUMARATE 50 MG PO TABS
50.0000 mg | ORAL_TABLET | Freq: Every day | ORAL | Status: DC
  Administered 2020-03-02 – 2020-03-10 (×9): 50 mg
  Filled 2020-03-02 (×9): qty 1

## 2020-03-02 MED ORDER — HYDROCORTISONE 1 % EX CREA
TOPICAL_CREAM | Freq: Three times a day (TID) | CUTANEOUS | Status: DC | PRN
  Filled 2020-03-02 (×2): qty 28

## 2020-03-02 MED ORDER — ONDANSETRON HCL 4 MG/2ML IJ SOLN: 4.0000 mg | Freq: Four times a day (QID) | INTRAMUSCULAR | Status: DC | PRN

## 2020-03-02 NOTE — Progress Notes (Signed)
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    Past Medical History  Transferred to ICU from general floor overnight due to bradycardia. Improved with trachel suctioning of copious secretions. Bronchoscopy showing copious secretions but other etiology for blockage  Significant Hospital Events   11/03 Admitted 11/04 craniotomy with clipping of left PICA 11/12 total thyroidectomy, tracheostomy 11/13 start ABx for fever and tracheobronchitis 11/16 Now 11 days post clipping.  Drain stopped yesterday so left clamped.  Was interactive with PT yesterday. This morning, agitated and complaining of back pain. More somnolent following fentanyl this morning. Stable from neuro check  11/21 pulm called back again;  trach bleeding. we called ENT 11/23 subglottic obstruction which is new observed on MBS-->CT neck ordered to r/o infection. Also abx started. As she failed MBS IR consulted for PEG. Should be last day for nimodipine. We asked IM to assist w. Care  12/4:  Put on mechanical ventilation and transferred to ICU  Consults:  ENT (Dr.Rosen) Neuro surgery  Procedures:  11/03 ETT >>11/09 11/03 EVD >> 11/09 ETT >> 11/12 11/12 Tracheostomy >>  Significant Diagnostic Tests:  11/3 CT head > large volume subarachnoid related to ruptured left PICA aneurysm, intraventricular reflux with hydrocephalus, chronic small vessel disease, chronic small vessel disease 11/9 H&N U/S > 2.3x2.3x2.4 mass in the left neck along the left thyroid lobe 11/27 CT head > Resolution of subarachnoid hemorrhage, stable subdural hematoma 11/29 CT head > Unchanged size of right subdural hematoma with minimal leftward midline shift  Micro Data:  11/03 SARS2/ Flu > neg 11/08  respiratory culture >> negative 11/08 blood culture >>negative 11/11 urine culture >> multiple species 11/12 MRSA PCR >> negative 11/12 sputum: staph aureus, Citrobacter and Haemophilus parainfluenza (B lactamase +)  11/22 sputum >> multiple organism, no staph or strep 12/4 Sputum>> negative and gram-positive rods  Antimicrobials:  Zosyn 11/13 >> 11/14 Ceftriaxone 11/14 - 11/20  Interim history/subjective:   Patient remains on ventilator support.  Tracheostomy tube in place.  Discussion with patient this morning at bedside mouthing words and shaking her head that she does not want her trach.  Objective   Blood pressure 126/73, pulse 69, temperature 99.1 F (37.3 C), temperature source Axillary, resp. rate 19, height 5\' 9"  (1.753 m), weight 60 kg, SpO2 100 %.    Vent Mode: PRVC FiO2 (%):  [30 %-40 %] 30 % Set Rate:  [18 bmp] 18 bmp Vt Set:  [400 mL] 400 mL PEEP:  [5 cmH20] 5 cmH20 Pressure Support:  [8 cmH20] 8 cmH20 Plateau Pressure:  [13 cmH20-16 cmH20] 13 cmH20   Intake/Output Summary (Last 24 hours) at 03/02/2020 7371 Last data filed at 03/02/2020 0600 Gross per 24 hour  Intake 1470 ml  Output 375 ml  Net 1095 ml   Filed Weights   02/29/20 1256 03/01/20 0600 03/02/20 0500  Weight: 69.1 kg 66.2 kg 60 kg    Examination: Gen: Elderly female tracheostomy tube in place on mechanical vent HEENT: NCAT, trach in place, no significant secretions, no bleeding CV: Regular rhythm, S1-S2 Pulm: Clear to auscultation bilaterally, bilateral ventilated breath sounds Extm: No significant edema Skin: Dry, no significant rash Neuro: Alert able to follow commands answer questions nods head mouths words, yes and no  Resolved Hospital  Problem list    Tracheobronchitis (Resolved) Beta lactamase + H flu, Citrobacter and S.aureus are susceptible.: Patient completed 7 days treatment with ceftriaxone Trach site bleeding  11/22 consult for bleeding around trach, TXA and epi nebs ordered per  prelim recs   Assessment & Plan:   #Episode of bradycardia, responded to deep suctioning #Trach-dependence # Hx of poly-microbial tracheobronchitis Mucous plugging Plan: Continue routine trach care Robinul and scopolamine discontinued due to mucous plugging. Copious secretions are part of having a trach. Therefore she needs trach care and suctioning routinely I would avoid these medications as they are increasing her risk for having a repeat mucous plugging event and potential arrest. Wean from ventilator to trach collar today if possible. Once tolerating trach collar trial can likely move back out of the intensive care unit.  #SAH s/p clipping if ruptured left PICA aneurysm  post-op day 21 Underwent craniotomy and clipping 3 weeks prior. Off nimodipine. Follow up imaging shows resolution.  -Neurochecks, goal systolic blood pressure less than 180.  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  Discussion with her today states that she does not want this.  I explained that this was removed there is a chance that she may die.  She states that she wanted to "die" mouthing this word. I have placed a consult to palliative care to help [place] with this discussion.  Best practice (evaluated daily)   Diet: PEG Pain/Anxiety/Delirium protocol (if indicated): haldol prn VAP protocol (if indicated): Not on abx DVT prophylaxis: subqhep GI prophylaxis: PPI Glucose control: SSI Mobility: N/A last date of multidisciplinary goals of care discussion: unknown Family and staff present N/A Summary of discussion N/A Follow up goals of care discussion due today Code Status: Full Disposition: Possible transfer to progressive  today  Labs   CBC: Recent Labs  Lab 02/24/20 0033 02/25/20 0501 02/26/20 0652 02/29/20 0310  WBC 2.8* 2.8* 3.9* 4.2  HGB 8.5* 8.4* 9.4* 8.8*  HCT 27.9* 27.4* 30.6* 27.9*  MCV 85.1 86.2 84.8 84.8  PLT PLATELET CLUMPS NOTED ON SMEAR, UNABLE TO ESTIMATE 205 257 546    Basic Metabolic Panel: Recent Labs  Lab 02/24/20 0033 02/25/20 0501 02/26/20 0652 02/28/20 1150 02/29/20 0310  NA 141 139 136 137 136  K 3.7 3.7 3.1* 4.1 3.5  CL 109 108 102 96* 96*  CO2 21* 22 26 31 29   GLUCOSE 120* 95 130* 113* 155*  BUN 11 12 6* 10 9  CREATININE 0.58 0.49 0.54 0.60 0.52  CALCIUM 8.0* 7.8* 8.1* 8.6* 8.8*  MG 2.0 2.0 1.9  --   --    GFR: Estimated Creatinine Clearance: 76.2 mL/min (by C-G formula based on SCr of 0.52 mg/dL). Recent Labs  Lab 02/24/20 0033 02/25/20 0501 02/26/20 0652 02/29/20 0310  WBC 2.8* 2.8* 3.9* 4.2    Liver Function Tests: No results for input(s): AST, ALT, ALKPHOS, BILITOT, PROT, ALBUMIN in the last 168 hours. No results for input(s): LIPASE, AMYLASE in the last 168 hours. No results for input(s): AMMONIA in the last 168 hours.  ABG    Component Value Date/Time   PHART 7.444 02/06/2020 0843   PCO2ART 42.8 02/06/2020 0843   PO2ART 157 (H) 02/06/2020 0843   HCO3 29.2 (H) 02/06/2020 0843   TCO2 30  02/06/2020 0843   ACIDBASEDEF 4.0 (H) 01/31/2020 0113   O2SAT 99.0 02/06/2020 0843     Coagulation Profile: No results for input(s): INR, PROTIME in the last 168 hours.  Cardiac Enzymes: No results for input(s): CKTOTAL, CKMB, CKMBINDEX, TROPONINI in the last 168 hours.  HbA1C: Hgb A1c MFr Bld  Date/Time Value Ref Range Status  01/30/2020 08:59 AM 6.2 (H) 4.8 - 5.6 % Final    Comment:    (NOTE) Pre diabetes:          5.7%-6.4%  Diabetes:              >6.4%  Glycemic control for   <7.0% adults with diabetes     CBG: Recent Labs  Lab 02/29/20 2016 03/01/20 0004 03/01/20 0158 03/01/20 0335 03/01/20 0430  GLUCAP 109* 161* 124* 65* 113*     Review of Systems:   Unable to assess  Past Medical History  She,  has no past medical history on file.   Surgical History    Past Surgical History:  Procedure Laterality Date  . CRANIOTOMY Left 01/30/2020   Procedure: LEFT FAR LATERAL CRANIOTOMY FOR ANEURYSM CLIPPING;  Surgeon: [name], MD;  Location: [place];  Service: Neurosurgery;  Laterality: Left;  . DIRECT LARYNGOSCOPY N/A 02/29/2020   Procedure: DIRECT LARYNGOSCOPY;  Surgeon: [name], MD;  Location: [place];  Service: ENT;  Laterality: N/A;  . IR ANGIO INTRA EXTRACRAN SEL INTERNAL CAROTID BILAT MOD SED  01/30/2020  . IR ANGIO VERTEBRAL SEL VERTEBRAL UNI L MOD SED  01/30/2020  . IR GASTROSTOMY TUBE MOD SED  02/22/2020  . RADIOLOGY WITH ANESTHESIA N/A 01/30/2020   Procedure: IR WITH ANESTHESIA;  Surgeon: [name], MD;  Location: [place];  Service: Radiology;  Laterality: N/A;  . THYROIDECTOMY N/A 02/08/2020   Procedure: THYROIDECTOMY;  Surgeon: [name], MD;  Location: [place];  Service: ENT;  Laterality: N/A;  . TRACHEOSTOMY TUBE PLACEMENT N/A 02/08/2020   Procedure: TRACHEOSTOMY;  Surgeon: [name], MD;  Location: [place];  Service: ENT;  Laterality: N/A;  . TRACHEOSTOMY TUBE PLACEMENT N/A 02/29/2020   Procedure: TRACHEOSTOMY EXCHANGE;  Surgeon: [name], MD;  Location: [facility];  Service: ENT;  Laterality: N/A;     Social History   Unknown  Family History   Her family history is not on file.   Allergies No Known Allergies   Home Medications  Prior to Admission medications   Not on File    Garner Nash, DO [facility] 03/02/2020 7:14 AM

## 2020-03-03 DIAGNOSIS — Z9911 Dependence on respirator [ventilator] status: Secondary | ICD-10-CM

## 2020-03-03 DIAGNOSIS — J9600 Acute respiratory failure, unspecified whether with hypoxia or hypercapnia: Secondary | ICD-10-CM

## 2020-03-03 LAB — GLUCOSE, CAPILLARY
Glucose-Capillary: 110 mg/dL — ABNORMAL HIGH (ref 70–99)
Glucose-Capillary: 115 mg/dL — ABNORMAL HIGH (ref 70–99)
Glucose-Capillary: 124 mg/dL — ABNORMAL HIGH (ref 70–99)
Glucose-Capillary: 154 mg/dL — ABNORMAL HIGH (ref 70–99)
Glucose-Capillary: 159 mg/dL — ABNORMAL HIGH (ref 70–99)
Glucose-Capillary: 163 mg/dL — ABNORMAL HIGH (ref 70–99)
Glucose-Capillary: 87 mg/dL (ref 70–99)

## 2020-03-03 MED ORDER — LACTULOSE 10 GM/15ML PO SOLN
20.0000 g | Freq: Two times a day (BID) | ORAL | Status: AC
  Administered 2020-03-03 (×2): 20 g
  Filled 2020-03-03 (×2): qty 30

## 2020-03-03 MED FILL — Medication: Qty: 1 | Status: AC

## 2020-03-03 NOTE — Progress Notes (Signed)
PT Cancellation Note  Patient Details Name: Stacy Moran MRN: [id number] DOB: 02-Feb-1956   Cancelled Treatment:    Reason Eval/Treat Not Completed: Patient not medically ready (pt attempting to wean and per NP Mickel Baas hold until hopefully off vent today)   Stacy Moran 03/03/2020, 8:48 AM  Bayard Males, PT Acute Rehabilitation Services Pager: [phone] Office: [phone]

## 2020-03-03 NOTE — Progress Notes (Signed)
Inpatient Rehab Admissions Coordinator:   Note pt with decline in status over weekend; now back on the vent.  Palliative Medicine consult pending.  Will sign off at this time.  Once pt liberated from the vent and participating in therapies, we can re-screen for candidacy.   Shann Medal, PT, DPT Admissions Coordinator [phone] 03/03/20  12:43 PM

## 2020-03-03 NOTE — Progress Notes (Addendum)
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  Required tracheostomy with prolonged hospital course.    Past Medical History  Transferred to ICU from general floor overnight due to bradycardia. Improved with trachel suctioning of copious secretions. Bronchoscopy showing copious secretions but other etiology for blockage  Significant Hospital Events   11/03 Admitted 11/04 craniotomy with clipping of left PICA 11/12 total thyroidectomy, tracheostomy 11/13 start ABx for fever and tracheobronchitis 11/16 Now 11 days post clipping.  Drain stopped yesterday so left clamped.  Was interactive with PT yesterday. This morning, agitated and complaining of back pain. More somnolent following fentanyl this morning. Stable from neuro check  11/21 pulm called back again;  trach bleeding. we called ENT 11/23 subglottic obstruction which is new observed on MBS-->CT neck ordered to r/o infection. Also abx started. As she failed MBS IR consulted for PEG. Should be last day for nimodipine. We asked IM to assist w. 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 11/27 CT head > Resolution of subarachnoid hemorrhage, stable subdural hematoma 11/29 CT head > Unchanged size of right subdural hematoma with  minimal leftward midline shift  Micro Data:  11/03 SARS2/ Flu > neg 11/08 respiratory culture >> negative 11/08 blood culture >>negative 11/11 urine culture >> multiple species 11/12 MRSA PCR >> negative 11/12 sputum: staph aureus, Citrobacter and Haemophilus parainfluenza (B lactamase +)  11/22 sputum >> multiple organism, no staph or strep 12/4 Sputum>> negative and gram-positive rods  Antimicrobials:  Zosyn 11/13 >> 11/14 Ceftriaxone 11/14 - 11/20  Interim history/subjective:  Patient was not able to wean from ventilator support yesterday, trial PS and trach collar again today  Objective   Blood pressure 130/80, pulse 67, temperature 98.2 F (36.8 C), temperature source Axillary, resp. rate 18, height 5\' 9"  (1.753 m), weight 62.3 kg, SpO2 100 %.    Vent Mode: PRVC FiO2 (%):  [30 %] 30 % Set Rate:  [18 bmp] 18 bmp Vt Set:  [400 mL] 400 mL PEEP:  [5 cmH20] 5 cmH20 Pressure Support:  [5 cmH20] 5 cmH20 Plateau Pressure:  [14 cmH20-15 cmH20] 15 cmH20   Intake/Output Summary (Last 24 hours) at 03/03/2020 0805 Last data filed at 03/03/2020 0600 Gross per 24 hour  Intake 1609.88 ml  Output 700 ml  Net 909.88 ml   Filed Weights   03/01/20 0600 03/02/20 0500 03/03/20 0434  Weight: 66.2 kg 60 kg 62.3 kg    Examination: Gen: Elderly female tracheostomy tube in place on mechanical vent, arousable and in no distress  HEENT: NCAT, trach in place, no significant secretions, no bleeding, thick  White secretions suctioned CV: Regular rhythm, S1-S2 Pulm: Clear to auscultation bilaterally, bilateral ventilated breath sounds Extm: No significant edema Skin: Dry, no significant rash Neuro: Alert able to follow commands answer questions nods head mouths words, yes  and no  Resolved Hospital Problem list    Tracheobronchitis (Resolved) Beta lactamase + H flu, Citrobacter and S.aureus are susceptible.: Patient completed 7 days treatment with ceftriaxone Trach site bleeding  11/22  consult for bleeding around trach, TXA and epi nebs ordered per prelim recs   Assessment & Plan:   Acute on chronic respiratory failure likely secondary to mucous plugging with episode of bradycardia, responded to deep suctioning Trach-dependence  Hx of poly-microbial tracheobronchitis Mucous plugging Plan: -Tolerating PS this morning, trial trach collar and if tolerating can move out of ICU -Continue routine trach care -Robinul and scopolamine discontinued due to mucous plugging, her secretions will be chronic secondary to trach, continue routine suctioning and would avoid scopolamine and robinul asions as they are increasing her risk for having a repeat mucous plugging event and potential arrest.   SAH s/p clipping if ruptured left PICA aneurysm  post-op day 21 Underwent craniotomy and clipping 3 weeks prior. Off nimodipine. Follow up imaging shows resolution.  -Neurochecks, goal systolic blood pressure less than 180.  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  Hypothyroidism  Stable -Continue levothyroxine  Dysphagia -PEG tube  HTN Continue labetalol, hydralazine, amlodipine  Diabetes -SSI with Lantus  Goals of care: Palliative care consult pending as patient was expressing wishes to have trach removed yesterday   Best practice (evaluated daily)   Diet: PEG Pain/Anxiety/Delirium protocol (if indicated): haldol prn VAP protocol (if indicated): Not on abx DVT prophylaxis: subqhep GI prophylaxis: PPI Glucose control: SSI Mobility: N/A last date of multidisciplinary goals of care discussion: unknown Family and staff present N/A Summary of discussion N/A Follow up goals of care discussion due today Family communication: Pt's daughter updated by phone Code Status: Full Disposition: Possible transfer to progressive today  Labs   CBC: Recent Labs  Lab 02/24/20 0033 02/25/20 0501 02/26/20 0652 02/29/20 0310  WBC 2.8* 2.8* 3.9* 4.2  HGB  8.5* 8.4* 9.4* 8.8*  HCT 27.9* 27.4* 30.6* 27.9*  MCV 85.1 86.2 84.8 84.8  PLT PLATELET CLUMPS NOTED ON SMEAR, UNABLE TO ESTIMATE 205 257 700    Basic Metabolic Panel: Recent Labs  Lab 02/24/20 0033 02/25/20 0501 02/26/20 0652 02/28/20 1150 02/29/20 0310  NA 141 139 136 137 136  K 3.7 3.7 3.1* 4.1 3.5  CL 109 108 102 96* 96*  CO2 21* 22 26 31 29   GLUCOSE 120* 95 130* 113* 155*  BUN 11 12 6* 10 9  CREATININE 0.58 0.49 0.54 0.60 0.52  CALCIUM 8.0* 7.8* 8.1* 8.6* 8.8*  MG 2.0 2.0 1.9  --   --    GFR: Estimated Creatinine Clearance: 76.2 mL/min (by C-G formula based on SCr of 0.52 mg/dL). Recent Labs  Lab 02/24/20 0033 02/25/20 0501 02/26/20 0652 02/29/20 0310  WBC 2.8* 2.8* 3.9* 4.2    Liver Function Tests: No results for input(s): AST, ALT, ALKPHOS, BILITOT, PROT, ALBUMIN in the last 168 hours. No results for input(s): LIPASE, AMYLASE in the last 168 hours. No results for input(s): AMMONIA in the last 168 hours.  ABG    Component Value Date/Time   PHART 7.444 02/06/2020 0843   PCO2ART 42.8 02/06/2020 0843   PO2ART 157 (H) 02/06/2020 0843   HCO3 29.2 (H) 02/06/2020 0843   TCO2 30 02/06/2020 0843   ACIDBASEDEF 4.0 (H) 01/31/2020 0113   O2SAT 99.0 02/06/2020 0843     Coagulation Profile: No results for input(s): INR, PROTIME in the last 168 hours.  Cardiac Enzymes: No results for  input(s): CKTOTAL, CKMB, CKMBINDEX, TROPONINI in the last 168 hours.  HbA1C: Hgb A1c MFr Bld  Date/Time Value Ref Range Status  01/30/2020 08:59 AM 6.2 (H) 4.8 - 5.6 % Final    Comment:    (NOTE) Pre diabetes:          5.7%-6.4%  Diabetes:              >6.4%  Glycemic control for   <7.0% adults with diabetes     CBG: Recent Labs  Lab 02/29/20 2016 03/01/20 0004 03/01/20 0158 03/01/20 0335 03/01/20 0430  GLUCAP 109* 161* 124* 65* 113*    Review of Systems:   Unable to assess  Past Medical History  She,  has no past medical history on file.   Surgical History     Past Surgical History:  Procedure Laterality Date  . CRANIOTOMY Left 01/30/2020   Procedure: LEFT FAR LATERAL CRANIOTOMY FOR ANEURYSM CLIPPING;  Surgeon: [name], MD;  Location: [place];  Service: Neurosurgery;  Laterality: Left;  . DIRECT LARYNGOSCOPY N/A 02/29/2020   Procedure: DIRECT LARYNGOSCOPY;  Surgeon: [name], MD;  Location: [place];  Service: ENT;  Laterality: N/A;  . IR ANGIO INTRA EXTRACRAN SEL INTERNAL CAROTID BILAT MOD SED  01/30/2020  . IR ANGIO VERTEBRAL SEL VERTEBRAL UNI L MOD SED  01/30/2020  . IR GASTROSTOMY TUBE MOD SED  02/22/2020  . RADIOLOGY WITH ANESTHESIA N/A 01/30/2020   Procedure: IR WITH ANESTHESIA;  Surgeon: [name], MD;  Location: [place];  Service: Radiology;  Laterality: N/A;  . THYROIDECTOMY N/A 02/08/2020   Procedure: THYROIDECTOMY;  Surgeon: [name], MD;  Location: [place];  Service: ENT;  Laterality: N/A;  . TRACHEOSTOMY TUBE PLACEMENT N/A 02/08/2020   Procedure: TRACHEOSTOMY;  Surgeon: [name], MD;  Location: [place];  Service: ENT;  Laterality: N/A;  . TRACHEOSTOMY TUBE PLACEMENT N/A 02/29/2020   Procedure: TRACHEOSTOMY EXCHANGE;  Surgeon: [name], MD;  Location: [facility];  Service: ENT;  Laterality: N/A;     Social History   Unknown  Family History   Her family history is not on file.   Allergies No Known Allergies   Home Medications  Prior to Admission medications   Not on File   CRITICAL CARE Performed by: Otilio Carpen Gleason   Total critical care time: 40 minutes  Critical care time was exclusive of separately billable procedures and treating other patients.  Critical care was necessary to treat or prevent imminent or life-threatening deterioration.  Critical care was time spent personally by me on the following activities: development of treatment plan with patient and/or surrogate as well as nursing, discussions with consultants, evaluation of patient's response to treatment, examination of patient,  obtaining history from patient or surrogate, ordering and performing treatments and interventions, ordering and review of laboratory studies, ordering and review of radiographic studies, pulse oximetry and re-evaluation of patient's condition.   Otilio Carpen Gleason, PA-C [place]  Pager# [phone], if no answer [phone]  Critical care attending:  This is a 64 year old female, history of subarachnoid hemorrhage, ruptured PICA aneurysm complicated course by prolonged chronic respiratory failure requiring tracheostomy tube placement.  Patient was readmitted to the ICU following mucous plugging event and bradycardic episode.  Resolved following deep suctioning and removal of mucous plugging.  Patient tolerating trach collar trial today.  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 Subarachnoid hemorrhage status post clipping of a ruptured left PICA aneurysm  Plan: Trach collar trial today. Avoidance of medications that can dry up secretions as this increases patient's risk for mucous plugging. Continue routine trach care, lavage saline for secretion management and suction if needed. If tolerates trach collar trial can consider movement from the intensive care unit.  Garner Nash, DO [facility] 03/03/2020 2:26 PM

## 2020-03-03 NOTE — Progress Notes (Signed)
Nutrition Follow-up  DOCUMENTATION CODES:   Not applicable  INTERVENTION:   Continue tube feeds via PEG: - Osmolite 1.2 @ 60 ml/hr (1440 ml/day) - ProSource TF 45 ml BID  Tube feeding regimen provides 1808 kcal, 102 grams of protein, and 1181 ml of H2O.   NUTRITION DIAGNOSIS:   Inadequate oral intake related to acute illness as evidenced by estimated needs.  Ongoing  GOAL:   Patient will meet greater than or equal to 90% of their needs  Met via TF  MONITOR:   Vent status, Labs, Weight trends, Skin, I & O's  REASON FOR ASSESSMENT:   Ventilator    ASSESSMENT:   64 year old female who presented to the ED on 11/03 with headache. 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 Pt required intubation for airway protection.  11/03 - admitted 11/04 [facility], L PICA aneurysm to OR for craniotomy, clipping  11/09 - extubated but immediately re-intubated 11/12 - s/p thyroidectomy with tracheostomy; cortrak placed (tip gastric) 11/16 - EVD removed 11/17 - trach exchange to facilitate PMV 11/20 - Cortrak removed by pt 11/22 - trach change (pt self-decannulated) 11/26 - PEG placed 12/01 - MBS with recommendation for NPO 12/03 - s/p direct laryngoscopy 12/04 - transferred to ICU, put on mechanical ventilation  Discussed pt with RN and during ICU rounds. Per CCM, plan is to wean vent and resume trach collar. Palliative Care Team has been consulted regarding [place] as pt has been trying to pull out her trach.  Admit weight: 66.9 kg Current weight: 62.3 kg  Current TF: Osmolite 1.2 @ 60 ml/hr, ProSource TF 45 ml BID  Will continue with current TF at this time.  Patient is currently on ventilator support via trach MV: 6.5 L/min Temp (24hrs), Avg:98.7 F (37.1 C), Min:98.2 F (36.8 C), Max:99.2 F (37.3 C)  Drips: LR: 10 ml/hr  Medications reviewed and include: colace, SSI q 4 hours, reglan 5 mg q 6 hours, protonix,  miralax  Labs reviewed: hemoglobin 8.1 CBG's: 110-163 x 24 hours  UOP: 700 ml x 24 hours I/O's: +8.2 L since admit  Diet Order:   Diet Order    None      EDUCATION NEEDS:   No education needs have been identified at this time  Skin:  Skin Assessment: Skin Integrity Issues: Stage II: throat Incisions: neck, head Other: skin tear back  Last BM:  02/28/20  Height:   Ht Readings from Last 1 Encounters:  02/29/20 _0  (1.753 m)    Weight:   Wt Readings from Last 1 Encounters:  03/03/20 62.3 kg    Ideal Body Weight:  65.9 kg  BMI:  Body mass index is 20.28 kg/m.  Estimated Nutritional Needs:   Kcal:  1700-1900  Protein:  95-110 grams  Fluid:  > 1.7 L/day    Gustavus Bryant, MS, RD, LDN Inpatient Clinical Dietitian Please see AMiON for contact information.

## 2020-03-03 NOTE — Progress Notes (Signed)
OT Cancellation Note  Patient Details Name: Stacy Moran MRN: [id number] DOB: 1955/07/30   Cancelled Treatment:    Reason Eval/Treat Not Completed: Patient not medically ready pt with recent transfer to ICU, attempting to wean off vent today and not medically ready.  Will follow and see as able/appropriate.  Jolaine Artist, OT Acute Rehabilitation Services Pager [phone] Office [phone]   Delight Stare 03/03/2020, 9:05 AM

## 2020-03-03 NOTE — Progress Notes (Signed)
Patient resting comfortably on aerosol trach collar. No respiratory distress noted. RT did not place patient on ventilator at this time. RT will continue to monitor as needed. Vent at bedside on standby.

## 2020-03-04 DIAGNOSIS — Z7189 Other specified counseling: Secondary | ICD-10-CM

## 2020-03-04 DIAGNOSIS — Z515 Encounter for palliative care: Secondary | ICD-10-CM

## 2020-03-04 LAB — CBC
HCT: 29.5 % — ABNORMAL LOW (ref 36.0–46.0)
Hemoglobin: 8.8 g/dL — ABNORMAL LOW (ref 12.0–15.0)
MCH: 25.8 pg — ABNORMAL LOW (ref 26.0–34.0)
MCHC: 29.8 g/dL — ABNORMAL LOW (ref 30.0–36.0)
MCV: 86.5 fL (ref 80.0–100.0)
Platelets: 157 10*3/uL (ref 150–400)
RBC: 3.41 MIL/uL — ABNORMAL LOW (ref 3.87–5.11)
RDW: 19.3 % — ABNORMAL HIGH (ref 11.5–15.5)
WBC: 4.9 10*3/uL (ref 4.0–10.5)
nRBC: 0 % (ref 0.0–0.2)

## 2020-03-04 LAB — GLUCOSE, CAPILLARY
Glucose-Capillary: 149 mg/dL — ABNORMAL HIGH (ref 70–99)
Glucose-Capillary: 150 mg/dL — ABNORMAL HIGH (ref 70–99)
Glucose-Capillary: 107 mg/dL — ABNORMAL HIGH (ref 70–99)
Glucose-Capillary: 169 mg/dL — ABNORMAL HIGH (ref 70–99)
Glucose-Capillary: 116 mg/dL — ABNORMAL HIGH (ref 70–99)
Glucose-Capillary: 156 mg/dL — ABNORMAL HIGH (ref 70–99)

## 2020-03-04 LAB — BASIC METABOLIC PANEL
Anion gap: 10 (ref 5–15)
BUN: 11 mg/dL (ref 8–23)
CO2: 30 mmol/L (ref 22–32)
Calcium: 8.9 mg/dL (ref 8.9–10.3)
Chloride: 98 mmol/L (ref 98–111)
Creatinine, Ser: 0.56 mg/dL (ref 0.44–1.00)
GFR, Estimated: >60 mL/min (ref >60)
Glucose, Bld: 148 mg/dL — ABNORMAL HIGH (ref 70–99)
Potassium: 4.2 mmol/L (ref 3.5–5.1)
Sodium: 138 mmol/L (ref 135–145)

## 2020-03-04 MED ORDER — LACTULOSE 10 GM/15ML PO SOLN
20.0000 g | Freq: Two times a day (BID) | ORAL | Status: AC
  Administered 2020-03-04 (×2): 20 g
  Filled 2020-03-04 (×2): qty 30

## 2020-03-04 NOTE — Consult Note (Signed)
[place] Nurse wound follow up Patient receiving care in [facility] 2M05.  Dr. Valeta Harms at bedside and observed trach wound at the time of my visit. Wound type: full thickness wound around tracheostomy  Measurement: deferred Wound bed: could not be seen due to excessive secretions Drainage (amount, consistency, odor) heavy yellow Periwound: intact Dressing procedure/placement/frequency: Order Drawtex 4x4 dressings([place] # [id number])DGLOV dressing and use around the trach site for increased drainage and bleeding. Change daily.  Place Aquacel Kellie Simmering # [phone]) directly over wound, then top with Drawtex. Change everything daily and prn saturation. Val Riles, RN, MSN, CWOCN, CNS-BC, pager [phone]

## 2020-03-04 NOTE — Progress Notes (Signed)
Patient transferred to 3W room 6, report given to nurse.

## 2020-03-04 NOTE — Progress Notes (Signed)
[place] Progress Note Patient Name: Stacy Moran DOB: 18-Nov-1955 MRN: [id number]   Date of Service  03/04/2020  HPI/Events of Note  Request for bilateral wrist restraints Patient seen almost sideways on bed  eICU Interventions  Bilateral soft wrist restraints ordered as she remains critically ill with risk of pulling lines and tubes     Intervention Category Minor Interventions: Agitation / anxiety - evaluation and management  Judd Lien 03/04/2020, 5:30 AM

## 2020-03-04 NOTE — Consult Note (Signed)
Consultation Note Date: 03/04/2020   Patient Name: Stacy Moran  DOB: 24-May-1955  MRN: [id number]  Age / Sex: 64 y.o., female  PCP: No primary care provider on file. Referring Physician: Garner Nash, DO  Reason for Consultation: Establishing goals of care  HPI/Patient Profile: 64 y.o. female  with no past medical history in our system who was admitted on 01/30/2020 with a severe headache and was found to have a subarachnoid hemorrhage with a ruptured aneurysm.  No family was present at the time she was taken to emergent surgery with presumed consent.  Craniotomy with clipping of left PICA was performed.  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  She underwent thyroidectomy and tracheostomy placement.  She has had difficulty with subglottic obstruction and poor wound healing at the site of the trach.  She has also had difficulty with mucous plugging and secretions.  Patient frequently tries to pull out tracheostomy as well as other tubes.  She was recently understood to indicate that she no longer wanted the tracheostomy.  PMT was asked to evaluate for goals of care.  Clinical Assessment and Goals of Care:  I have reviewed medical records including EPIC notes, labs and imaging, received report from the ICU MD, examined the patient and spoke on the phone with her daughter, [name] to discuss diagnosis prognosis, GOC, disposition and options.  I introduced Palliative Medicine as specialized medical care for people living with serious illness. It focuses on providing relief from the symptoms and stress of a serious illness.   Stacy Moran greeted me with a friendly smile as I came into the room.  She appeared very alert and oriented.  She is unable to verbalize words because of her tracheostomy and secretions, but speaks silently and is able to make her needs known.  I explained that I was  from palliative care and I was called because of her tracheostomy.  She indicated that she had had trouble with the tracheostomy and it was uncomfortable.  I asked her if she wanted the tracheostomy and she clearly replied "of course I want it".  She seemed to clearly understand that without it she would not be able to breathe.  We chatted for another couple of minutes about her family and where she was from.  I asked her who would make medical decisions for her if she was unable to represent herself?  She indicated that [name] would be the person to make medical decisions for her.  I then asked her if she were to become much sicker and her heart were to stop, would she want [place] to aggressively press on her chest, shock her, and attempt to resuscitate her?  She mouth the word "yes" and assertively nodded her head.  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  I helped the RN slide her up in bed.  And then I asked her if I could return in a few days  to chat with her again and she responded yes.  I then spoke to her daughter on the phone.  Her daughter [name] told me that she is her only child.  The patient was originally born in the [place] and raised in [place].  Their family is originally from [place].  They also have family in [place] state.  Stacy Moran came to [place] to be near her daughter and grandchildren.  She works for [employer] on the [employer] during night shift.  She lives with her daughter.  She adores her daughter, her grandchildren and their boxer.  She is a big sports fan including football, basketball and tennis.  She loves to listen to classic reggae such as Thea Alken.  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  She has a very strong spirit and is quite the Management consultant".  As far as functional and nutritional status, patient has a PEG tube in place and is receiving  feedings.  She was able to get up and walk with a rolling walker and physical therapy today.  She went 22 feet with minimum assistance.  She is being considered for CIR.  We discussed her current illness and what it means in the larger context of her on-going co-morbidities.  Her daughter and I talked about the mental stress associated with such a prolonged hospitalization, particularly as she has had to wear mitts and be restrained in order not to pull out tubes and lines.  [name] tells me that the patient becomes disoriented and does not realize what she is doing.  She sometimes does not remember that [name] has visited the day before.  Being in the hospital has been very taxing on her mentally.  Getting out of bed and into the recliner chair helps improve her mental outlook significantly.  Stacy Moran appreciated the call.  She expressed concern over her mother continually attempting to pull out her trach.  She also expressed a desire to hear updates from the doctors more frequently.  I invited [name] in this weekend when she is off of work to meet with me and have a more detailed discussion about her mother's condition.  [name] expressed wanting updates on her mothers head and brain.  Questions and concerns were addressed.  The family was encouraged to call with questions or concerns.    Primary Decision Maker:  PATIENT.  Her surrogate decision maker is her daughter [name] if she is not oriented.    SUMMARY OF RECOMMENDATIONS    Code Status/Advance Care Planning:  Full code   Symptom Management:   She is currently on low dose seroquel.  If she tolerates it well it could be titrated up.  If it is ineffective, Olanzapine may be an effective alternative.  Additional Recommendations (Limitations, Scope, Preferences):  Full Scope Treatment  Palliative Prophylaxis:   Delirium Protocol  Psycho-social/Spiritual:   Desire for further Chaplaincy support:  Requested.  Prognosis:  Unable to  determine.  Is at high risk for decline and death unfortunately.    Discharge Planning: To Be Determined      Primary Diagnoses: Present on Admission: . Ruptured cerebral aneurysm ([place]) . Prediabetes . Accelerated hypertension   I have reviewed the medical record, interviewed the patient and family, and examined the patient. The following aspects are pertinent.  History reviewed. No pertinent past medical history. Social History   Socioeconomic History  . Marital status: Unknown  Spouse name: Not on file  . Number of children: Not on file  . Years of education: Not on file  . Highest education level: Not on file  Occupational History  . Not on file  Tobacco Use  . Smoking status: Not on file  Substance and Sexual Activity  . Alcohol use: Not on file  . Drug use: Not on file  . Sexual activity: Not on file  Other Topics Concern  . Not on file  Social History Narrative  . Not on file   Social Determinants of Health   Financial Resource Strain:   . Difficulty of Paying Living Expenses: Not on file  Food Insecurity:   . Worried About Charity fundraiser in the Last Year: Not on file  . Ran Out of Food in the Last Year: Not on file  Transportation Needs:   . Lack of Transportation (Medical): Not on file  . Lack of Transportation (Non-Medical): Not on file  Physical Activity:   . Days of Exercise per Week: Not on file  . Minutes of Exercise per Session: Not on file  Stress:   . Feeling of Stress : Not on file  Social Connections:   . Frequency of Communication with Friends and Family: Not on file  . Frequency of Social Gatherings with Friends and Family: Not on file  . Attends Religious Services: Not on file  . Active Member of Clubs or Organizations: Not on file  . Attends Archivist Meetings: Not on file  . Marital Status: Not on file   History reviewed. No pertinent family history.  No Known Allergies    Vital Signs: BP (!) 169/97   Pulse  73   Temp 98.4 F (36.9 C) (Oral)   Resp 18   Ht 5\' 9"  (1.753 m)   Wt 53.8 kg   SpO2 95%   BMI 17.52 kg/m  Pain Scale: CPOT POSS *See Group Information*: S-Acceptable,Sleep, easy to arouse Pain Score: Asleep   SpO2: SpO2: 95 % O2 Device:SpO2: 95 % O2 Flow Rate: .O2 Flow Rate (L/min): 5 L/min    Palliative Assessment/Data: 40%     Time In: 1:00 Time Out: 2:00 Time Total: 60 min. Visit consisted of counseling and education dealing with the complex and emotionally intense issues surrounding the need for palliative care and symptom management in the setting of serious and potentially life-threatening illness. Greater than 50%  of this time was spent counseling and coordinating care related to the above assessment and plan.  Signed by: Florentina Jenny, PA-C Palliative Medicine  Please contact Palliative Medicine Team phone at [phone] for questions and concerns.  For individual provider: See Shea Evans

## 2020-03-04 NOTE — Progress Notes (Signed)
SLP Cancellation Note  Patient Details Name: Stacy Moran MRN: [id number] DOB: Jun 15, 1955   Cancelled treatment:       Reason Eval/Treat Not Completed: Other (comment). Pt sleeping, on trach collar but cuff is inflated. Unsure if PMSV trials are appropriate. Severity of "pre stenosis" limits airflow severely and prior to ICU visit pt was only attempting PMSV with SLP only for a few seconds with attempts to mobilize air for there purpose of clearing upper airway and subglottic secretions. Pts dysphagia is also very severe and any trials will lead to moderate aspiration. Will follow for improved medical stability before attempting further interventions.    Trey Bebee, [name] 03/04/2020, 9:25 AM

## 2020-03-04 NOTE — Progress Notes (Signed)
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  11/4, pt s/p L far-lateral craniotomy for clipping of PICA aneurysm. 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 12/4 pt with desat and bradycardia and was transferred to ICU and placed on the vent. Pt off vent 12/6.   OT comments  Pt making steady progress towards OT goals this session. Pt continues to present with impulsivity, decreased safety awareness, and impaired balance. Pt reports needing to void bowels needing min guard assist to transition from supine>sitting with elevated HOB. MIN HHA to transfer from EOB<>BSC. Pt noted to impulsively reach for trach and lines needing close supervision for safety during transfer. Pt required total A for posterior pericare in standing and MAX A for LB dressing during toileting. Pt would continue to benefit from skilled occupational therapy while admitted and after d/c to address the below listed limitations in order to improve overall functional mobility and facilitate independence with BADL participation. DC plan remains appropriate, will follow acutely per POC.   25% FiO2 5LPM with SpO2 briefly dopping to 83% but able to rebound with increased time and cues for slow breathing.    Follow Up Recommendations  CIR    Equipment Recommendations  Wheelchair (measurements OT);Wheelchair cushion (measurements OT);Hospital bed    Recommendations for Other Services      Precautions / Restrictions Precautions Precautions: Fall Precaution Comments: trach, wrist restraints, bil mittens Restrictions Weight Bearing Restrictions: No       Mobility  Bed Mobility Overal bed mobility: Needs Assistance Bed Mobility: Supine to Sit     Supine to sit: Min guard;HOB elevated     General bed mobility comments: light MIN guard to transition from supine>sitting mostly for line mgmt and safety  Transfers Overall transfer level: Needs assistance Equipment used: 2 person hand held assist Transfers: Sit to/from [facility] Sit to Stand: Min assist;+2 safety/equipment Stand pivot transfers: Min assist;+2 physical assistance;+2 safety/equipment       General transfer comment: pt able to sit<>stand x2 during session needing MIN HHA +2 for safety with line mgmt d/t impulsivity. Pt completed stand pivot tranfser from EOB<>BSC needing +2 HHA as well as tactile directional cues to initiate pivotal steps during transfer    Balance Overall balance assessment: Needs assistance Sitting-balance support: Feet supported;No upper extremity supported Sitting balance-Leahy Scale: Fair Sitting balance - Comments: pt able to sit EOB without assist with close guarding for safety and impulsivity   Standing balance support: During functional activity;Bilateral upper extremity supported Standing balance-Leahy Scale: Poor Standing balance comment: Reliant on UE support and physical assist to maintain standing durign transfer and for static standing during pericare                           ADL either performed or assessed with clinical judgement   ADL Overall ADL's : Needs assistance/impaired Eating/Feeding: NPO           Lower Body Bathing: Total assistance;Sit to/from stand Lower Body Bathing Details (indicate cue type and reason): simulated via posterior pericare     Lower Body Dressing: Maximal assistance;Sit to/from stand Lower Body Dressing Details (indicate cue type and  reason): to pull up briefs during toileting via sit<>stand Toilet Transfer: Minimal assistance;BSC;+2 for safety/equipment;Stand-pivot;+2 for physical  assistance Toilet Transfer Details (indicate cue type and reason): MIN A +2 for safety to pivot from EOB<>BSC, cues for safety as pt impulsively pulling at lines, needing directional cues during transfer [place] and Hygiene: Total assistance;Sit to/from stand Toileting - Clothing Manipulation Details (indicate cue type and reason): for posterior pericare post BM     Functional mobility during ADLs: Minimal assistance;Rolling walker;+2 for physical assistance;+2 for safety/equipment;Cueing for safety;Cueing for sequencing General ADL Comments: pt continues to present with decreased safety awareness, impaired proprioception needing directional cues during mobility and impaired balance     Vision       Perception     Praxis      Cognition Arousal/Alertness: Awake/alert Behavior During Therapy: Impulsive Overall Cognitive Status: Impaired/Different from baseline Area of Impairment: Attention;Memory;Following commands;Safety/judgement;Awareness;Problem solving                 Orientation Level: Disoriented to;Time;Situation;Place Current Attention Level: Sustained Memory: Decreased recall of precautions Following Commands: Follows one step commands with increased time;Follows multi-step commands inconsistently;Follows one step commands inconsistently Safety/Judgement: Decreased awareness of safety;Decreased awareness of deficits Awareness: Emergent Problem Solving: Slow processing;Difficulty sequencing;Requires verbal cues;Requires tactile cues General Comments: pt intially following commands but then noted to become impulsive reaching for trach and lines.        Exercises   Shoulder Instructions       General Comments pt on trach collar 25% FiO2 5LPM with spO2 briefly dopping to 83% but able to rebound with increased time and cues for slow breathing.    Pertinent Vitals/ Pain       Pain Assessment: No/denies pain  Home Living                                           Prior Functioning/Environment              Frequency  Min 2X/week        Progress Toward Goals  OT Goals(current goals can now be found in the care plan section)  Progress towards OT goals: Progressing toward goals  Acute Rehab OT Goals Patient Stated Goal: to go home OT Goal Formulation: With patient Time For Goal Achievement: 03/07/20 Potential to Achieve Goals: Good  Plan Discharge plan remains appropriate;Frequency remains appropriate    Co-evaluation                 AM-PAC OT "6 Clicks" Daily Activity     Outcome Measure   Help from another person eating meals?: Total (NPO) Help from another person taking care of personal grooming?: A Lot Help from another person toileting, which includes using toliet, bedpan, or urinal?: A Lot Help from another person bathing (including washing, rinsing, drying)?: A Lot Help from another person to put on and taking off regular upper body clothing?: A Lot Help from another person to put on and taking off regular lower body clothing?: Total 6 Click Score: 10    End of Session Equipment Utilized During Treatment: Rolling walker;Oxygen;Other (comment) (25% FiO2 5LPM)  OT Visit Diagnosis: Unsteadiness on feet (R26.81);Muscle weakness (generalized) (M62.81);Other abnormalities of gait and mobility (R26.89) Hemiplegia - Right/Left: Right Hemiplegia - dominant/non-dominant: Dominant Hemiplegia - caused by: Nontraumatic intracerebral hemorrhage   Activity Tolerance Patient tolerated treatment well   Patient Left  in bed;with call bell/phone within reach;with bed alarm set   Nurse Communication Mobility status        Time: 2458-0998 OT Time Calculation (min): 29 min  Charges: OT General Charges $OT Visit: 1 Visit OT Treatments $Self Care/Home Management : 23-37 mins  Lanier Clam., COTA/L Acute Rehabilitation Services [phone]    Ihor Gully 03/04/2020, 2:56 PM

## 2020-03-04 NOTE — Progress Notes (Signed)
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  11/4, pt s/p L far-lateral craniotomy for clipping of PICA aneurysm. 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 12/4 pt with desat and bradycardia and was transferred to ICU and placed on the vent. Pt off vent 12/6.    PT Comments    Pt supine on arrival with eyes closed but would open eyes to voice and respond to questions and statements. Pt not oriented other than so self and demonstrates continued ability for limited gait and transfers with 2 person assist. In recliner pt continually kicking legs off sides of foot rest and transition foot rest to additional chair with arms in front of pt. Pt educated for restraints, lines and continued progression with CIR still appropriate.  HR 79 SpO2 93% with FiO2 32% during activity and 28% at rest    Follow Up Recommendations  CIR;Supervision/Assistance - 24 hour     Equipment Recommendations  Wheelchair (measurements PT);Wheelchair cushion (measurements PT);3in1 (PT)    Recommendations for Other Services       Precautions / Restrictions Precautions Precautions: Fall Precaution Comments: trach, wrist restraints, bil mittens    Mobility  Bed Mobility Overal bed mobility: Needs Assistance Bed Mobility: Supine to Sit     Supine to sit: Min assist;HOB elevated     General bed mobility comments: physical assist to initiate movement of legs toward EOB with HHA to elevate trunk, increased time and multimodal cues to complete transition from supine to sitting  Transfers Overall transfer level: Needs assistance   Transfers: Sit to/from  Stand Sit to Stand: Min assist;+2 safety/equipment         General transfer comment: min physical assist with multimodal cues. +2 for safety and lines to stand from bed x 2 and from recliner. Pt with mod assist to sit in recliner initial trial as pt not following commands and needing increased assist. 2nd trial to sit min assist. Pt sitting to far right of surface and needing cues to correct but able to scoot to EOB and in chair without assist  Ambulation/Gait Ambulation/Gait assistance: Min assist;+2 safety/equipment Gait Distance (Feet): 22 Feet Assistive device: Rolling walker (2 wheeled) Gait Pattern/deviations: Step-through pattern;Decreased stride length;Ataxic;Trunk flexed;Narrow base of support;Scissoring   Gait velocity interpretation: <1.8 ft/sec, indicate of risk for recurrent falls General Gait Details: pt scissoring x 2 hips shifted to left, trunk and legs shifted to right in stance with physical assist to control hips and balance with use of RW and close chair follow. Cues to increase BOS and for direction. 2 trials with seated rest between   Stairs             Wheelchair Mobility    Modified Rankin (Stroke Patients Only) Modified Rankin (Stroke Patients Only) Pre-Morbid Rankin Score: No symptoms Modified Rankin: Moderately severe disability     Balance Overall balance assessment: Needs assistance Sitting-balance support: Feet supported;No upper extremity supported Sitting balance-Leahy Scale: Fair Sitting balance - Comments: pt able to sit EOB without assist with close guarding for safety and impulsivity   Standing balance support: During functional activity;Bilateral upper extremity supported Standing balance-Leahy Scale: Poor Standing balance comment: Reliant on UE support and  physical assist to maintain standing                            Cognition Arousal/Alertness: Awake/alert Behavior During Therapy: Impulsive Overall Cognitive Status:  Impaired/Different from baseline Area of Impairment: Attention;Memory;Following commands;Safety/judgement;Awareness;Problem solving                 Orientation Level: Disoriented to;Time;Situation;Place Current Attention Level: Sustained Memory: Decreased recall of precautions;Decreased short-term memory Following Commands: Follows one step commands with increased time;Follows multi-step commands with increased time;Follows multi-step commands inconsistently;Follows one step commands inconsistently Safety/Judgement: Decreased awareness of safety;Decreased awareness of deficits Awareness: Emergent Problem Solving: Slow processing;Difficulty sequencing;Requires verbal cues;Requires tactile cues General Comments: pt impulsive at times reaching for trach and IV but also with dry skin and scratching. Pt with intermittent commands following with delay and at time will quickly respond to cues/commands and others needs 3-4 trials to perform. Pt requires close supervision to prevent pulling lines. Pt smiling and interacting appropriately with lack of awareness for time, place or situation      Exercises General Exercises - Lower Extremity Long Arc Quad: Both;10 reps;Seated;AROM    General Comments        Pertinent Vitals/Pain Pain Assessment: No/denies pain    Home Living                      Prior Function            PT Goals (current goals can now be found in the care plan section) Progress towards PT goals: Progressing toward goals    Frequency    Min 4X/week      PT Plan Current plan remains appropriate    Co-evaluation              AM-PAC PT "6 Clicks" Mobility   Outcome Measure  Help needed turning from your back to your side while in a flat bed without using bedrails?: A Little Help needed moving from lying on your back to sitting on the side of a flat bed without using bedrails?: A Little Help needed moving to and from a bed to a chair (including  a wheelchair)?: A Little Help needed standing up from a chair using your arms (e.g., wheelchair or bedside chair)?: A Little Help needed to walk in hospital room?: A Lot Help needed climbing 3-5 steps with a railing? : Total 6 Click Score: 15    End of Session Equipment Utilized During Treatment: Gait belt;Oxygen Activity Tolerance: Patient tolerated treatment well Patient left: in chair;with call bell/phone within reach;with chair alarm set;with restraints reapplied;with nursing/sitter in room Nurse Communication: Mobility status;Precautions PT Visit Diagnosis: Hemiplegia and hemiparesis;Other symptoms and signs involving the nervous system (R29.898);Other abnormalities of gait and mobility (R26.89);Unsteadiness on feet (R26.81);Muscle weakness (generalized) (M62.81);Difficulty in walking, not elsewhere classified (R26.2) Hemiplegia - caused by: Other Nontraumatic intracranial hemorrhage     Time: 9458-5929 PT Time Calculation (min) (ACUTE ONLY): 44 min  Charges:  $Gait Training: 8-22 mins $Therapeutic Activity: 23-37 mins                     Donicia Druck P, PT Acute Rehabilitation Services Pager: [phone] Office: [place] 03/04/2020, 12:23 PM

## 2020-03-04 NOTE — Progress Notes (Signed)
NAME:  Stacy Moran, MRN:  [id number], DOB:  02/27/1956, LOS: 107 ADMISSION DATE:  01/30/2020, CONSULTATION DATE:  01/30/20 REFERRING MD:  Ralene Ok, CHIEF COMPLAINT:  Headache   Brief History   64 y/o presented with large subarachnoid hemorrhage due to ruptured PICA with resultant hydrocephalus.  Required tracheostomy with prolonged hospital course.    Past Medical History  Transferred to ICU from general floor overnight due to bradycardia. Improved with trachel suctioning of copious secretions. Bronchoscopy showing copious secretions but other etiology for blockage  Significant Hospital Events   11/03 Admitted 11/04 craniotomy with clipping of left PICA 11/12 total thyroidectomy, tracheostomy 11/13 start ABx for fever and tracheobronchitis 11/16 Now 11 days post clipping.  Drain stopped yesterday so left clamped.  Was interactive with PT yesterday. This morning, agitated and complaining of back pain. More somnolent following fentanyl this morning. Stable from neuro check  11/21 pulm called back again;  trach bleeding. we called ENT 11/23 subglottic obstruction which is new observed on MBS-->CT neck ordered to r/o infection. Also abx started. As she failed MBS IR consulted for PEG. Should be last day for nimodipine. We asked IM to assist w. 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 11/27 CT head > Resolution of subarachnoid hemorrhage, stable subdural hematoma 11/29 CT head > Unchanged size of right subdural hematoma with  minimal leftward midline shift  Micro Data:  11/03 SARS2/ Flu > neg 11/08 respiratory culture >> negative 11/08 blood culture >>negative 11/11 urine culture >> multiple species 11/12 MRSA PCR >> negative 11/12 sputum: staph aureus, Citrobacter and Haemophilus parainfluenza (B lactamase +)  11/22 sputum >> multiple organism, no staph or strep 12/4 Sputum>> negative and gram-positive rods  Antimicrobials:  Zosyn 11/13 >> 11/14 Ceftriaxone 11/14 - 11/20  Interim history/subjective:   Tolerating TCT and up with PT   Objective   Blood pressure (!) 148/88, pulse 74, temperature 98.7 F (37.1 C), temperature source Oral, resp. rate (!) 22, height 5\' 9"  (1.753 m), weight 53.8 kg, SpO2 100 %.    Vent Mode: Stand-by FiO2 (%):  [28 %-30 %] 28 % Set Rate:  [18 bmp] 18 bmp Vt Set:  [400 mL] 400 mL PEEP:  [5 cmH20] 5 cmH20 Pressure Support:  [5 cmH20-7 cmH20] 7 cmH20 Plateau Pressure:  [14 cmH20] 14 cmH20   Intake/Output Summary (Last 24 hours) at 03/04/2020 0800 Last data filed at 03/04/2020 0600 Gross per 24 hour  Intake 1439.84 ml  Output 2250 ml  Net -810.16 ml   Filed Weights   03/02/20 0500 03/03/20 0434 03/04/20 0500  Weight: 60 kg 62.3 kg 53.8 kg    Examination: Gen: elderly fm, trach in place  HEENT: trach in place. Please see image of wound followed by wound care  CV: RRR, s1 s2  Pulm: BL breath sounds, no crackles no wheeze Extm: no edema  Skin: dry, no edema  Neuro: alert following commands, was able to stand with PT       Resolved Hospital Problem list    Tracheobronchitis (Resolved) Beta lactamase + H  flu, Citrobacter and S.aureus are susceptible.: Patient completed 7 days treatment with ceftriaxone Trach site bleeding  11/22 consult for bleeding around trach, TXA and epi nebs ordered per prelim recs   Assessment & Plan:   Acute on chronic respiratory failure likely secondary to mucous plugging with episode of bradycardia, responded to deep  suctioning Trach-dependence  Hx of poly-microbial tracheobronchitis Mucous plugging Plan: TCT as tolerated  Mobility Trach care  Continue to work with PT   [place] s/p clipping if ruptured left PICA aneurysm  post-op day 21  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  Hypothyroidism  Stable - levothyroxine   Dysphagia - PEG with TFs   HTN - continue bp meds   Diabetes - SSI +lantus   Goals of care: Appreciate palliative care team input   Likely will need trach capable SNF placement    Best practice (evaluated daily)   Diet: PEG Pain/Anxiety/Delirium protocol (if indicated): haldol prn VAP protocol (if indicated): Not on abx DVT prophylaxis: subqhep GI prophylaxis: PPI Glucose control: SSI Mobility: N/A last date of multidisciplinary goals of care discussion: unknown Family and staff present N/A Summary of discussion N/A Follow up goals of care discussion due today Family communication: Pt's daughter updated by phone Code Status: Full Disposition: Possible transfer to progressive today   Garner Nash, DO [facility] 03/04/2020 8:00 AM

## 2020-03-05 LAB — GLUCOSE, CAPILLARY
Glucose-Capillary: 123 mg/dL — ABNORMAL HIGH (ref 70–99)
Glucose-Capillary: 124 mg/dL — ABNORMAL HIGH (ref 70–99)
Glucose-Capillary: 130 mg/dL — ABNORMAL HIGH (ref 70–99)
Glucose-Capillary: 133 mg/dL — ABNORMAL HIGH (ref 70–99)
Glucose-Capillary: 143 mg/dL — ABNORMAL HIGH (ref 70–99)
Glucose-Capillary: 169 mg/dL — ABNORMAL HIGH (ref 70–99)

## 2020-03-05 NOTE — Progress Notes (Addendum)
Pt's O2 sats noted to be in 40's and pt less responsive with sternal rub. Pt's trach noted to be caked with dried mucus, once plug suctioned out pt's O2 sats gradually improved. RRT and RT both to bedside, pt's sats improved upon their arrival. Pt now responding to voice and able to follow commands (opening eyes and gripping hand). Dr. Patsey Berthold (CC) paged then called and updated.

## 2020-03-05 NOTE — Progress Notes (Signed)
[facility] PROGRESS NOTE  Stacy Moran XYV:[id number] DOB: 03-25-1956 DOA: 01/30/2020 PCP: No primary care provider on file.  Status: Remains inpatient appropriate because:Altered mental status, Unsafe d/c plan, IV treatments appropriate due to intensity of illness or inability to take PO and Inpatient level of care appropriate due to severity of illness   Dispo: The patient is from: Home              Anticipated d/c is to: SNF              Anticipated d/c date is: > 3 days              Patient currently is not medically stable to d/c.  Barriers to discharge include altered mentation, anticipation trach downsizing/capping and potential for decannulation will be a long process; requirement for PEG tube feedings.  Therapies recommending either CIR or SNF pending funding.  Patient has no insurance and has been deemed Medicaid potential  Code Status: Full Family Communication: PATIENT ONLY DVT prophylaxis: Subcutaneous heparin Vaccination status: No documentation patient has received Covid vaccine  Foley catheter: No;  pure wick device   HPI: Patient admitted on 11/3 other neurosurgical service after presenting with severe headache.  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  She was hypertensive in the ER and was initially awake and following commands but became lethargic after administration of fentanyl with some improvement after Narcan.  Patient was intubated and transferred to the neuro ICU for further care.  Patient has no significant past medical history.  During the hospitalization patient underwent a craniotomy on 11/4.  Was unable to be safely extubated and underwent tracheostomy placement on 11/12.  Hospital course complicated by Citrobacter and Haemophilus influenza tracheobronchitis.  Patient was also noted with a large thyroid mass and underwent total thyroidectomy as  well on 11/12.  She developed bleeding around the trach site on 11/21 and ENT was consulted.  2 days later on 11/23 a new subglottic obstruction was observed on modified barium swallow and CT did not reveal any signs of obstruction or abscess.  Continues to fail swallowing evaluation so PEG was placed.  She briefly required mechanical ventilation on 12/4 and has subsequently been weaned to a trach collar and was moved out of the ICU to PCU 3 W. on 12/7.  In addition to the above patient has also had issues regarding desaturations related to mucous plugging.  On 12/7 palliative care team met with patient and spoke with daughter during evaluation by telephone.  Currently remains a full code with daughter [name] is surrogate decision-maker with patient being delegated primary decision maker.  Significant events 11/03 Admitted 11/04 craniotomy with clipping of left PICA 11/12 total thyroidectomy, tracheostomy 11/13 start ABx for fever and tracheobronchitis 11/16 Now 11 days post clipping.  Drain stopped yesterday so left clamped.  Was interactive with PT yesterday. This morning, agitated and complaining of back pain. More somnolent following fentanyl this morning. Stable from neuro check  11/21 pulm called back again;  trach bleeding. we called ENT 11/23 subglottic obstruction which is new observed on MBS-->CT neck ordered to r/o infection. Also abx started. As she failed MBS IR consulted for PEG. Should be last day for nimodipine. We asked IM to assist w. Care  12/4:  Put on mechanical ventilation and transferred to ICU 12/6 tolerating PS  Subjective: Patient sleepy but did open eyes when I entered room and spoke to  her.  She nodded to questions asked and denied any issues with pain but did not participate further with exam and remained sleepy and lethargic  Objective: Vitals:   03/05/20 1014 03/05/20 1200  BP: 134/70   Pulse: 82   Resp:    Temp:    SpO2:  98%    Intake/Output Summary (Last  24 hours) at 03/05/2020 1255 Last data filed at 03/05/2020 0448 Gross per 24 hour  Intake 3445.01 ml  Output 1450 ml  Net 1995.01 ml   Filed Weights   03/04/20 0500 03/04/20 1658 03/05/20 0326  Weight: 53.8 kg 53.9 kg 54.3 kg    Exam:  Constitutional: NAD, calm, appears to be comfortable Neck: Tracheostomy tube in place, large volume of yellow-green secretions oozing from trach insertion site with associated bubbling of secretions. Respiratory: Coarse to auscultation bilaterally, no wheezing, no crackles. Normal respiratory effort. No accessory muscle use.  Midline tracheostomy tube in place with 35% FiO2 per trach collar Cardiovascular: Regular rate and rhythm, no murmurs / rubs / gallops. No extremity edema. 2+ pedal pulses.  Abdomen: no tenderness, no masses palpated. . Bowel sounds positive.  PEG tube for tube feedings at 60 cc/h Musculoskeletal: no clubbing / cyanosis. No joint deformity upper and lower extremities. no contractures.  Appears to have normal muscle tone but difficult to assess secondary to patient's lethargy.  Skin: no rashes, lesions, ulcers. No induration Neurologic: CN 2-12 grossly intact. Sensation grossly intact, unable to adequately assess strength given patient lethargy and inability to consistently participate with exam Psychiatric: Awakens to name.  Trach in place and unable to phonate therefore psychiatric exam limited.  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 -POD 22; has completed course of nimodipine -Highly deconditioned; not appear to have any focal neurological deficits -Continue PT and OT-current recommendation is for CIR although depending on participation may be better suited for SNF  Acute on chronic respiratory failure (multifactorial) 1) tracheostomy dependent 2) recurrent mucous plugging and history of polymicrobial tracheobronchitis this  admission -Continues to have thick yellow-green secretions oozing out of trach insertion site therefore will culture-likely colonizing -Current wound care to trach includes drawtex 4 x 4 dressing to be changed daily.  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 -Mass caused tracheal compression -Continue Synthroid  Metabolic encephalopathy -Multifactorial secondary to acute critical illness as well as recent neurological bleed -Patient having issues with agitation and confusion; continue Seroquel at bedtime as well as IV Haldol as needed for significant agitation -Continue to push mobility with PT and OT and encourage socialization -Continue wrist restraints to prevent patient from accidentally pulling out tracheostomy tube or PEG  Dysphagia/mild to moderate protein calorie malnutrition -Continue tube feedings per PEG -Continue Prosource tube feeding liquid Nutrition Problem: Inadequate oral intake Etiology: acute illness Signs/Symptoms: estimated needs Interventions: Prostat, Tube feeding Estimated body mass index is 17.68 kg/m as calculated from the following:   Height as of this encounter: 5' 9" (1.753 m).   Weight as of this encounter: 54.3 kg.  -No clear-cut indication to continue Reglan and given requirement for antipsychotic concerns that concomitant use of Reglan with antipsychotic may lead to dystonia or EPS symptoms   Incision (Closed) 01/31/20 Head (Active)  Date First Assessed/Time First Assessed: 01/31/20 0138   Location: Head    Assessments 01/31/2020  1:00 AM 03/02/2020  8:00 PM  Dressing Type -- None  Site / Wound Assessment -- Clean;Dry  Margins Attached edges (approximated) Attached edges (approximated)  Closure Staples Staples  Drainage Amount -- None  Treatment -- Off loading     No  Linked orders to display     Incision (Closed) 02/08/20 Neck Other (Comment) (Active)  Date First Assessed/Time First Assessed: 02/08/20 1335   Location: Neck  Location Orientation: Other (Comment)    Assessments 02/10/2020 12:00 AM 03/04/2020  7:41 PM  Dressing Type Gauze (Comment) None  Dressing Changed Intact  Dressing Change Frequency PRN --  Site / Wound Assessment Clean Clean;Dry  Closure None None  Drainage Amount Minimal --  Drainage Description Serosanguineous --  Treatment Cleansed --     No Linked orders to display     Wound / Incision (Open or Dehisced) 02/08/20 Skin tear Back Left pink (Active)  Date First Assessed/Time First Assessed: 02/08/20 2000   Wound Type: Skin tear  Location: Back  Location Orientation: Left  Wound Description (Comments): pink    Assessments 02/08/2020  8:00 PM 03/03/2020  8:00 PM  Dressing Type Foam - Lift dressing to assess site every shift --  Dressing Status Clean;Dry;Intact None  Dressing Change Frequency -- PRN  Site / Wound Assessment Pink Clean;Dry  Peri-wound Assessment Intact Intact  Drainage Amount None None  Treatment Cleansed --     No Linked orders to display     Pressure Injury 02/12/20 Throat Mid Stage 2 -  Partial thickness loss of dermis presenting as a shallow open injury with a red, pink wound bed without slough. (Active)  Date First Assessed/Time First Assessed: 02/12/20 1500   Location: Throat  Location Orientation: Mid  Staging: Stage 2 -  Partial thickness loss of dermis presenting as a shallow open injury with a red, pink wound bed without slough.  Present on Admis...    Assessments 02/12/2020  8:00 AM 03/04/2020  8:00 AM  Dressing Type Foam - Lift dressing to assess site every shift Foam - Lift dressing to assess site every shift  Dressing Change Frequency PRN PRN  Site / Wound Assessment Red;Painful;Other (Comment) --  Peri-wound Assessment Intact;Erythema (blanchable) --  Margins Unattached edges (unapproximated) --   Drainage Amount Minimal --  Drainage Description Serosanguineous --  Treatment Cleansed --     No Linked orders to display     Hypertension -New diagnosis this admission -Continue Norvasc, labetalol and hydralazine    Other problems: Diabetes mellitus 2 with mild hyperglycemia -New diagnosis this admission-globin A1c 6.2 -Continue Lantus and sliding scale insulin-of note hemoglobin A1c only mildly elevated therefore if able to transition to orally ingested diet recommend DC insulin in favor of oral hypoglycemic agents  Anemia of chronic illness -Current hemoglobin 8.8 -She did have issues with trach site bleeding that may have also contributed to anemia    Data Reviewed: Basic Metabolic Panel: Recent Labs  Lab 02/28/20 1150 02/29/20 0310 03/01/20 0712 03/04/20 0138  NA 137 136 137 138  K 4.1 3.5 4.3 4.2  CL 96* 96* 94* 98  CO2 _0 GLUCOSE 113* 155* 97 148*  BUN _1 CREATININE 0.60 0.52 0.51 0.56  CALCIUM 8.6* 8.8* 8.6* 8.9  MG  --   --  2.2  --   PHOS  --   --  3.6  --    Liver Function Tests: No results for input(s): AST, ALT, ALKPHOS, BILITOT, PROT, ALBUMIN  in the last 168 hours. No results for input(s): LIPASE, AMYLASE in the last 168 hours. No results for input(s): AMMONIA in the last 168 hours. CBC: Recent Labs  Lab 02/29/20 0310 03/01/20 0712 03/04/20 0138  WBC 4.2 7.0 4.9  HGB 8.8* 8.1* 8.8*  HCT 27.9* 24.8* 29.5*  MCV 84.8 82.9 86.5  PLT 170 177 157   Cardiac Enzymes: No results for input(s): CKTOTAL, CKMB, CKMBINDEX, TROPONINI in the last 168 hours. BNP (last 3 results) No results for input(s): BNP in the last 8760 hours.  ProBNP (last 3 results) No results for input(s): PROBNP in the last 8760 hours.  CBG: Recent Labs  Lab 03/04/20 1944 03/04/20 2327 03/05/20 0325 03/05/20 0800 03/05/20 1206  GLUCAP 116* 156* 169* 143* 133*    Recent Results (from the past 240 hour(s))  MRSA PCR Screening     Status: None    Collection Time: 03/01/20  4:12 AM   Specimen: Nasal Mucosa; Nasopharyngeal  Result Value Ref Range Status   MRSA by PCR NEGATIVE NEGATIVE Final    Comment:        The GeneXpert MRSA Assay (FDA approved for NASAL specimens only), is one component of a comprehensive MRSA colonization surveillance program. It is not intended to diagnose MRSA infection nor to guide or monitor treatment for MRSA infections. Performed at [facility], [place] [address], [place], [place] [address]   Aerobic/Anaerobic Culture (surgical/deep wound)     Status: None (Preliminary result)   Collection Time: 03/01/20  6:01 AM   Specimen: Trachea; Wound  Result Value Ref Range Status   Specimen Description TRACHEAL SITE  Final   Special Requests NONE  Final   Gram Stain   Final    FEW WBC PRESENT, PREDOMINANTLY PMN ABUNDANT GRAM NEGATIVE RODS MODERATE GRAM POSITIVE COCCI FEW GRAM VARIABLE ROD Performed at [facility], [place] [address] [place], [place] [address]    Culture   Final    MODERATE STREPTOCOCCUS GROUP F Beta hemolytic streptococci are predictably susceptible to penicillin and other beta lactams. Susceptibility testing not routinely performed. WITHIN MIXED ORGNISMS NO ANAEROBES ISOLATED; CULTURE IN PROGRESS FOR 5 DAYS    Report Status PENDING  Incomplete     Studies: No results found.  Scheduled Meds: . amLODipine  10 mg Per Tube Daily  . chlorhexidine gluconate (MEDLINE KIT)  15 mL Mouth Rinse BID  . Chlorhexidine Gluconate Cloth  6 each Topical Daily  . docusate  100 mg Per Tube BID  . feeding supplement (PROSource TF)  45 mL Per Tube BID  . heparin  5,000 Units Subcutaneous Q8H  . hydrALAZINE  25 mg Per Tube Q8H  . insulin aspart  0-20 Units Subcutaneous Q4H  . labetalol  300 mg Per Tube TID  . levothyroxine  100 mcg Per Tube Q0600  . mouth rinse  15 mL Mouth Rinse 10 times per day  . metoCLOPramide  5 mg Per Tube Q6H  . pantoprazole sodium  40 mg Per Tube Daily  .  polyethylene glycol  17 g Per Tube Daily  . polyvinyl alcohol  1 drop Left Eye BID  . QUEtiapine  50 mg Per Tube QHS  . sodium chloride flush  10-40 mL Intracatheter Q12H   Continuous Infusions: . feeding supplement (OSMOLITE 1.2 CAL) 1,000 mL (03/05/20 0348)  . 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   Accelerated hypertension   H/O total thyroidectomy   Tracheostomy status ([place])   Acute respiratory failure ([place])   Ventilator dependence [facility])   Consultants:  Neurosurgery  PCCM  ENT  Daily take meds  Procedures: 11/03 ETT >>11/09 11/03 EVD >> 11/09 ETT >> 11/12 11/12 Tracheostomy >>  Antibiotics: Anti-infectives (From admission, onward)   Start     Dose/Rate Route Frequency Ordered Stop   02/22/20 1845  ceFAZolin (ANCEF) IVPB 2g/100 mL premix  Status:  Discontinued        2 g 200 mL/hr over 30 Minutes Intravenous  Once 02/22/20 1758 03/01/20 0817   02/22/20 1522  ceFAZolin (ANCEF) IVPB 2g/100 mL premix        over 30 Minutes  Continuous PRN 02/22/20 1523 02/22/20 1510   02/22/20 1514  ceFAZolin (ANCEF) 2-4 GM/100ML-% IVPB       Note to Pharmacy: Luis Abed   : cabinet override      02/22/20 1514 02/23/20 0329   02/19/20 1500  ceFEPIme (MAXIPIME) 2 g in sodium chloride 0.9 % 100 mL IVPB        2 g 200 mL/hr over 30 Minutes Intravenous Every 12 hours 02/19/20 1408 02/26/20 0836   02/10/20 1445  cefTRIAXone (ROCEPHIN) 2 g in sodium chloride 0.9 % 100 mL IVPB        2 g 200 mL/hr over 30 Minutes Intravenous Every 24 hours 02/10/20 1348 02/15/20 1624   02/09/20 1500  piperacillin-tazobactam (ZOSYN) IVPB 3.375 g  Status:  Discontinued        3.375 g 12.5 mL/hr over 240 Minutes Intravenous Every 8 hours 02/09/20 0755 02/10/20 1348   02/09/20 0845  piperacillin-tazobactam (ZOSYN) IVPB 3.375 g         3.375 g 100 mL/hr over 30 Minutes Intravenous  Once 02/09/20 0755 02/09/20 1027        Time spent: [place] ANP  [facility] Pager [phone].

## 2020-03-05 NOTE — Plan of Care (Signed)

## 2020-03-05 NOTE — Progress Notes (Signed)
This chaplain responded to PMT consult for spiritual care. The Pt. is not responsive to the call of her name and the chaplain's attempts to connect verbally through family names and hobbies.  The Pt. RN-Neshell is bedside for Pt. medication. There is no change in the Pt. responsiveness after medicine was given.  This chaplain will F/U in the afternoon with spiritual care.

## 2020-03-06 LAB — GLUCOSE, CAPILLARY
Glucose-Capillary: 110 mg/dL — ABNORMAL HIGH (ref 70–99)
Glucose-Capillary: 112 mg/dL — ABNORMAL HIGH (ref 70–99)
Glucose-Capillary: 127 mg/dL — ABNORMAL HIGH (ref 70–99)
Glucose-Capillary: 136 mg/dL — ABNORMAL HIGH (ref 70–99)
Glucose-Capillary: 151 mg/dL — ABNORMAL HIGH (ref 70–99)
Glucose-Capillary: 153 mg/dL — ABNORMAL HIGH (ref 70–99)

## 2020-03-06 LAB — AEROBIC/ANAEROBIC CULTURE W GRAM STAIN (SURGICAL/DEEP WOUND)

## 2020-03-06 NOTE — Progress Notes (Addendum)
[facility] PROGRESS NOTE  Stacy Moran BTD:[id number] DOB: 1956/02/28 DOA: 01/30/2020 PCP: No primary care provider on file.       Status: Remains inpatient appropriate because:Altered mental status, Unsafe d/c plan, IV treatments appropriate due to intensity of illness or inability to take PO and Inpatient level of care appropriate due to severity of illness   Dispo: The patient is from: Home              Anticipated d/c is to: SNF              Anticipated d/c date is: > 3 days              Patient currently is not medically stable to d/c.  Barriers to discharge include altered mentation, anticipation trach downsizing/capping and potential for decannulation will be a long process; requirement for PEG tube feedings.  Therapies recommending either CIR or SNF pending funding.  Patient has no insurance and has been deemed Medicaid potential  Code Status: Full Family Communication: PATIENT ONLY; 12/9 daughter [name] by telephone DVT prophylaxis: Subcutaneous heparin Vaccination status: No documentation patient has received Covid vaccine  Foley catheter: No;  pure wick device   HPI: Patient admitted on 11/3 other neurosurgical service after presenting with severe headache.  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  She was hypertensive in the ER and was initially awake and following commands but became lethargic after administration of fentanyl with some improvement after Narcan.  Patient was intubated and transferred to the neuro ICU for further care.  Patient has no significant past medical history.  During the hospitalization patient underwent a craniotomy on 11/4.  Was unable to be safely extubated and underwent tracheostomy placement on 11/12.  Hospital course complicated by Citrobacter and Haemophilus influenza tracheobronchitis.  Patient was also noted with a large thyroid  mass and underwent total thyroidectomy as well on 11/12.  She developed bleeding around the trach site on 11/21 and ENT was consulted.  2 days later on 11/23 a new subglottic obstruction was observed on modified barium swallow and CT did not reveal any signs of obstruction or abscess.  Continues to fail swallowing evaluation so PEG was placed.  She briefly required mechanical ventilation on 12/4 and has subsequently been weaned to a trach collar and was moved out of the ICU to PCU 3 W. on 12/7.  In addition to the above patient has also had issues regarding desaturations related to mucous plugging.  On 12/7 palliative care team met with patient and spoke with daughter during evaluation by telephone.  Currently remains a full code with daughter [name] is surrogate decision-maker with patient being delegated primary decision maker.  Significant events 11/03 Admitted 11/04 craniotomy with clipping of left PICA 11/12 total thyroidectomy, tracheostomy 11/13 start ABx for fever and tracheobronchitis 11/16 Now 11 days post clipping.  Drain stopped yesterday so left clamped.  Was interactive with PT yesterday. This morning, agitated and complaining of back pain. More somnolent following fentanyl this morning. Stable from neuro check  11/21 pulm called back again;  trach bleeding. we called ENT 11/23 subglottic obstruction which is new observed on MBS-->CT neck ordered to r/o infection. Also abx started. As she failed MBS IR consulted for PEG. Should be last day for nimodipine. We asked IM to assist w. Care  12/4:  Put on mechanical ventilation and transferred to ICU 12/6 tolerating PS  Subjective: Slightly more awake  today.  Initially was smiling including when asked to smile for picture but appeared to become either distracted or sleepy noting did not follow commands afterwards.  During initial conversation told patient she may have another infection around the trach tube or in the lungs and her facial  expression appeared consistent with dismay I did inform her this is not uncommon for people with trach tubes in place activity.  Objective: Vitals:   03/06/20 0959 03/06/20 1152  BP: (!) 134/95 (!) 148/94  Pulse: 90 80  Resp:  (!) 22  Temp:  98.7 F (37.1 C)  SpO2:      Intake/Output Summary (Last 24 hours) at 03/06/2020 1245 Last data filed at 03/05/2020 2106 Gross per 24 hour  Intake 753.13 ml  Output 350 ml  Net 403.13 ml   Filed Weights   03/04/20 1658 03/05/20 0326 03/06/20 0500  Weight: 53.9 kg 54.3 kg 59.7 kg    Exam:  Constitutional: NAD, calm, appears to be comfortable Neck: Tracheostomy tube in place, yellow-green secretions oozing from trach insertion site with associated bubbling of secretions. Respiratory: Coarse to auscultation bilaterally, no wheezing, no crackles. Normal respiratory effort. No accessory muscle use.  Midline tracheostomy tube in place with 21% FiO2 per trach collar Cardiovascular: Regular rate and rhythm, no murmurs / rubs / gallops. No extremity edema.  Remedies warm with appropriate capillary refill Abdomen: no tenderness, no masses palpated. . Bowel sounds positive.  PEG tube for tube feedings at 60 cc/h Musculoskeletal: no clubbing / cyanosis. No joint deformity upper and lower extremities. no contractures.  Appears to have normal muscle tone but difficult to assess secondary to patient's lethargy.  Skin: no rashes, lesions, ulcers. No induration Neurologic: CN 2-12 grossly intact although does have mild disconjugate gaze and appearance of cataract on left eye. Sensation grossly intact, unable to adequately assess strength given patient lethargy and inability to consistently participate with exam Psychiatric: Awakens to name.  Trach in place and unable to phonate therefore psychiatric exam limited.  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 -POD 22;  has completed course of nimodipine -Highly deconditioned; not appear to have any focal neurological deficits -Continue PT and OT-current recommendation is for CIR although depending on participation may be better suited for SNF tenuous to require max assist for activities during therapy sessions  Acute on chronic respiratory failure (multifactorial) 1) tracheostomy dependent 2) recurrent mucous plugging and history of polymicrobial tracheobronchitis this admission -Continues to have thick yellow-green secretions oozing out of trach insertion site therefore will culture-preliminary with moderate gram-negative rods and abundant staph aureus-will await final culture results before decision made regarding possible treatment with antibiotics since this could be more reflective of colonization although with high volume of secretions this is likely acute tracheobronchitis. -Current wound care to trach:.  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 -Mass caused tracheal compression -Continue Synthroid  Metabolic encephalopathy -Multifactorial secondary to acute critical illness as well as recent neurological bleed -Patient having issues with agitation and confusion; continue Seroquel at bedtime as well as IV Haldol as needed for significant agitation -Continue to push mobility with PT and OT and encourage socialization -Continue wrist restraints to prevent patient from accidentally pulling out tracheostomy tube or PEG  Dysphagia/mild to moderate protein calorie malnutrition -Continue tube feedings per PEG -  Continue Prosource tube feeding liquid Nutrition Problem: Inadequate oral intake Etiology: acute illness Signs/Symptoms: estimated needs Interventions: Prostat,Tube feeding Estimated body mass index is 19.44  kg/m as calculated from the following:   Height as of this encounter: 5' 9"  (1.753 m).   Weight as of this encounter: 59.7 kg.     Incision (Closed) 01/31/20 Head (Active)  Date First Assessed/Time First Assessed: 01/31/20 0138   Location: Head    Assessments 01/31/2020  1:00 AM 03/02/2020  8:00 PM  Dressing Type -- None  Site / Wound Assessment -- Clean;Dry  Margins Attached edges (approximated) Attached edges (approximated)  Closure Staples Staples  Drainage Amount -- None  Treatment -- Off loading     No Linked orders to display     Incision (Closed) 02/08/20 Neck Other (Comment) (Active)  Date First Assessed/Time First Assessed: 02/08/20 1335   Location: Neck  Location Orientation: Other (Comment)    Assessments 02/10/2020 12:00 AM 03/04/2020  7:41 PM  Dressing Type Gauze (Comment) None  Dressing Changed Intact  Dressing Change Frequency PRN --  Site / Wound Assessment Clean Clean;Dry  Closure None None  Drainage Amount Minimal --  Drainage Description Serosanguineous --  Treatment Cleansed --     No Linked orders to display     Wound / Incision (Open or Dehisced) 02/08/20 Skin tear Back Left pink (Active)  Date First Assessed/Time First Assessed: 02/08/20 2000   Wound Type: Skin tear  Location: Back  Location Orientation: Left  Wound Description (Comments): pink    Assessments 02/08/2020  8:00 PM 03/05/2020 10:00 AM  Dressing Type Foam - Lift dressing to assess site every shift --  Dressing Status Clean;Dry;Intact --  Site / Wound Assessment Pink --  Peri-wound Assessment Intact --  Wound Length (cm) -- 0 cm  Wound Width (cm) -- 0 cm  Wound Depth (cm) -- 0 cm  Wound Volume (cm^3) -- 0 cm^3  Wound Surface Area (cm^2) -- 0 cm^2  Drainage Amount None --  Treatment Cleansed --     No Linked orders to display     Pressure Injury 02/12/20 Throat Mid Stage 2 -  Partial thickness loss of dermis presenting as a shallow open injury with a red, pink wound bed without  slough. (Active)  Date First Assessed/Time First Assessed: 02/12/20 1500   Location: Throat  Location Orientation: Mid  Staging: Stage 2 -  Partial thickness loss of dermis presenting as a shallow open injury with a red, pink wound bed without slough.  Present on Admis...    Assessments 02/12/2020  8:00 AM 03/05/2020  8:18 PM  Dressing Type Foam - Lift dressing to assess site every shift --  Dressing -- Clean;Dry;Intact  Dressing Change Frequency PRN --  Site / Wound Assessment Red;Painful;Other (Comment) --  Peri-wound Assessment Intact;Erythema (blanchable) --  Margins Unattached edges (unapproximated) --  Drainage Amount Minimal --  Drainage Description Serosanguineous --  Treatment Cleansed --     No Linked orders to display     Hypertension -New diagnosis this admission -Continue Norvasc, labetalol and hydralazine    Other problems: Diabetes mellitus 2 with mild hyperglycemia -New diagnosis this admission-globin A1c 6.2 -Continue Lantus and sliding scale insulin-of note hemoglobin A1c only mildly elevated therefore if able to transition to orally ingested diet recommend DC insulin in favor of oral hypoglycemic agents  Anemia of chronic illness -Current hemoglobin 8.8 -She did have issues with trach site bleeding that may have also contributed to anemia    Data  Reviewed: Basic Metabolic Panel: Recent Labs  Lab 02/29/20 0310 03/01/20 0712 03/04/20 0138  NA 136 137 138  K 3.5 4.3 4.2  CL 96* 94* 98  CO2 29 31 30   GLUCOSE 155* 97 148*  BUN 9 10 11   CREATININE 0.52 0.51 0.56  CALCIUM 8.8* 8.6* 8.9  MG  --  2.2  --   PHOS  --  3.6  --    Liver Function Tests: No results for input(s): AST, ALT, ALKPHOS, BILITOT, PROT, ALBUMIN in the last 168 hours. No results for input(s): LIPASE, AMYLASE in the last 168 hours. No results for input(s): AMMONIA in the last 168 hours. CBC: Recent Labs  Lab 02/29/20 0310 03/01/20 0712 03/04/20 0138  WBC 4.2 7.0 4.9  HGB 8.8*  8.1* 8.8*  HCT 27.9* 24.8* 29.5*  MCV 84.8 82.9 86.5  PLT 170 177 157   Cardiac Enzymes: No results for input(s): CKTOTAL, CKMB, CKMBINDEX, TROPONINI in the last 168 hours. BNP (last 3 results) No results for input(s): BNP in the last 8760 hours.  ProBNP (last 3 results) No results for input(s): PROBNP in the last 8760 hours.  CBG: Recent Labs  Lab 03/05/20 1945 03/05/20 2338 03/06/20 0355 03/06/20 0730 03/06/20 1149  GLUCAP 130* 124* 110* 151* 112*    Recent Results (from the past 240 hour(s))  MRSA PCR Screening     Status: None   Collection Time: 03/01/20  4:12 AM   Specimen: Nasal Mucosa; Nasopharyngeal  Result Value Ref Range Status   MRSA by PCR NEGATIVE NEGATIVE Final    Comment:        The GeneXpert MRSA Assay (FDA approved for NASAL specimens only), is one component of a comprehensive MRSA colonization surveillance program. It is not intended to diagnose MRSA infection nor to guide or monitor treatment for MRSA infections. Performed at [facility], [place] [address], [place], [place] [address]   Aerobic/Anaerobic Culture (surgical/deep wound)     Status: None (Preliminary result)   Collection Time: 03/01/20  6:01 AM   Specimen: Trachea; Wound  Result Value Ref Range Status   Specimen Description TRACHEAL SITE  Final   Special Requests NONE  Final   Gram Stain   Final    FEW WBC PRESENT, PREDOMINANTLY PMN ABUNDANT GRAM NEGATIVE RODS MODERATE GRAM POSITIVE COCCI FEW GRAM VARIABLE ROD Performed at [facility], [place] [address], [place], [place] [address]    Culture   Final    MODERATE STREPTOCOCCUS GROUP F Beta hemolytic streptococci are predictably susceptible to penicillin and other beta lactams. Susceptibility testing not routinely performed. WITHIN MIXED ORGNISMS NO ANAEROBES ISOLATED; CULTURE IN PROGRESS FOR 5 DAYS    Report Status PENDING  Incomplete  Culture, respiratory (non-expectorated)     Status: None (Preliminary result)    Collection Time: 03/05/20 12:01 PM   Specimen: Tracheal Aspirate; Respiratory  Result Value Ref Range Status   Specimen Description TRACHEAL ASPIRATE  Final   Special Requests NONE  Final   Gram Stain   Final    RARE WBC PRESENT,BOTH PMN AND MONONUCLEAR MODERATE GRAM NEGATIVE RODS ABUNDANT GRAM POSITIVE COCCI    Culture   Final    ABUNDANT STAPHYLOCOCCUS AUREUS SUSCEPTIBILITIES TO FOLLOW Performed at [facility], [place] [address], [place], [place] [address]    Report Status PENDING  Incomplete     Studies: No results found.  Scheduled Meds: . amLODipine  10 mg Per Tube Daily  . chlorhexidine gluconate (MEDLINE KIT)  15 mL Mouth Rinse BID  . Chlorhexidine Gluconate Cloth  6 each Topical Daily  . docusate  100 mg Per Tube BID  . feeding supplement (PROSource TF)  45 mL Per Tube BID  . heparin  5,000 Units Subcutaneous Q8H  . hydrALAZINE  25 mg Per Tube Q8H  . insulin aspart  0-20 Units Subcutaneous Q4H  . labetalol  300 mg Per Tube TID  . levothyroxine  100 mcg Per Tube Q0600  . mouth rinse  15 mL Mouth Rinse 10 times per day  . pantoprazole sodium  40 mg Per Tube Daily  . polyethylene glycol  17 g Per Tube Daily  . polyvinyl alcohol  1 drop Left Eye BID  . QUEtiapine  50 mg Per Tube QHS  . sodium chloride flush  10-40 mL Intracatheter Q12H   Continuous Infusions: . feeding supplement (OSMOLITE 1.2 CAL) 1,000 mL (03/06/20 1037)  . 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   Accelerated hypertension   H/O total thyroidectomy   Tracheostomy status ([place])   Acute respiratory failure ([place])   Ventilator dependence [facility])   Consultants:  Neurosurgery  PCCM  ENT  Daily take meds  Procedures: 11/03 ETT >>11/09 11/03 EVD >> 11/09 ETT >> 11/12 11/12 Tracheostomy  >>  Antibiotics: Anti-infectives (From admission, onward)   Start     Dose/Rate Route Frequency Ordered Stop   02/22/20 1845  ceFAZolin (ANCEF) IVPB 2g/100 mL premix  Status:  Discontinued        2 g 200 mL/hr over 30 Minutes Intravenous  Once 02/22/20 1758 03/01/20 0817   02/22/20 1522  ceFAZolin (ANCEF) IVPB 2g/100 mL premix        over 30 Minutes  Continuous PRN 02/22/20 1523 02/22/20 1510   02/22/20 1514  ceFAZolin (ANCEF) 2-4 GM/100ML-% IVPB       Note to Pharmacy: Luis Abed   : cabinet override      02/22/20 1514 02/23/20 0329   02/19/20 1500  ceFEPIme (MAXIPIME) 2 g in sodium chloride 0.9 % 100 mL IVPB        2 g 200 mL/hr over 30 Minutes Intravenous Every 12 hours 02/19/20 1408 02/26/20 0836   02/10/20 1445  cefTRIAXone (ROCEPHIN) 2 g in sodium chloride 0.9 % 100 mL IVPB        2 g 200 mL/hr over 30 Minutes Intravenous Every 24 hours 02/10/20 1348 02/15/20 1624   02/09/20 1500  piperacillin-tazobactam (ZOSYN) IVPB 3.375 g  Status:  Discontinued        3.375 g 12.5 mL/hr over 240 Minutes Intravenous Every 8 hours 02/09/20 0755 02/10/20 1348   02/09/20 0845  piperacillin-tazobactam (ZOSYN) IVPB 3.375 g        3.375 g 100 mL/hr over 30 Minutes Intravenous  Once 02/09/20 0755 02/09/20 1027       Time spent: 20 minutes    Erin Hearing ANP  [facility] Pager [phone].

## 2020-03-06 NOTE — Progress Notes (Signed)
SLP Cancellation Note  Patient Details Name: Meygan Kyser MRN: [id number] DOB: [birth year]   Cancelled treatment:       Reason Eval/Treat Not Completed: Medical issues which prohibited therapy. Pt continues to exhibit lethargy as well copious secretions. SLP will follow for appropriateness for PMSV and dysphagia intervention.    [place], CCC-SLP 03/06/2020, 3:12 PM

## 2020-03-06 NOTE — Progress Notes (Signed)
This chaplain is present for F/U spiritual care. The Pt. responds to the call of her name with wide eyes.    The chaplain sits bedside with the Pt. after an introduction. The chaplain attempted conversation from points of interest in the chart notes.  There was not a similar response from the Pt.    This chaplain will check in with the PMT before the next spiritual care visit.

## 2020-03-06 NOTE — Progress Notes (Signed)
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  11/4, pt s/p L far-lateral craniotomy for clipping of PICA aneurysm. 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 12/4 pt with desat and bradycardia and was transferred to ICU and placed on the vent. Pt off vent 12/6.   OT comments  Pt seen in conjunction with PT to maximize pts activity tolerance and for pt/ therapist safety as pt continues to be impulsive during session. Pt continues to present with decreased safety awareness, impaired proprioception needing MAX directional cues during mobility and impaired balance. Pt completed x2 stand pivot transfer with MAX +2 HHA from EOB>BSC>recliner. Pt with strong R lateral lean during transfer with no awareness to balance impairment. Pt following commands ~ 10% of session but did nod appropriately occasionally during session. During transfer, noted a shift in pts trach placement and alerted RN immediately to come assess trach position. RN assessed and allowed therapists to continue session. Pt would continue to benefit from skilled occupational therapy while admitted and after d/c to address the below listed limitations in order to improve overall functional mobility and facilitate independence with BADL participation. DC plan remains appropriate, will follow acutely per POC.    Follow Up Recommendations  CIR    Equipment Recommendations  Wheelchair (measurements OT);Wheelchair cushion (measurements OT);Hospital bed    Recommendations for Other Services Rehab consult    Precautions / Restrictions Precautions Precautions: Fall Precaution  Comments: trach, wrist restraints, bil mittens Restrictions Weight Bearing Restrictions: No       Mobility Bed Mobility Overal bed mobility: Needs Assistance Bed Mobility: Supine to Sit     Supine to sit: HOB elevated;Min assist     General bed mobility comments: HHA to pull trunk to ascend to sit EOB with tactile and verbal cues to manage legs off EOB.  Transfers Overall transfer level: Needs assistance Equipment used: 2 person hand held assist Transfers: Sit to/from [facility] Sit to Stand: Max assist;+2 physical assistance;+2 safety/equipment Stand pivot transfers: Max assist;+2 physical assistance;+2 safety/equipment       General transfer comment: Pt with strong R lean/push this date. MaxAx2 with bilat knee block and extensive multi-modal cues for weight shifting to take side steps to transfer 1x to L to commode and 1x to R to chair. Increased ease going towards R.    Balance Overall balance assessment: Needs assistance Sitting-balance support: Bilateral upper extremity supported;Feet supported Sitting balance-Leahy Scale: Poor Sitting balance - Comments: UE support with pt leaning strongly to R, initially requiring minA but as pt fatigued she required mod-maxA to maintain balance. Max tactile and verbal cues at trunk, neck, and R arm to find and maintain midline, without success. Postural control: Right lateral lean Standing balance support: During functional activity;Bilateral upper extremity supported Standing balance-Leahy Scale: Zero Standing balance comment: MaxAx2 with bilat knee block and UE support to maintain balance due to strong R lean.                           ADL either performed or assessed with clinical judgement   ADL Overall ADL's : Needs assistance/impaired Eating/Feeding: NPO   Grooming: Total assistance;Sitting  Grooming Details (indicate cue type and reason): toatl A to apply lotion to Pts BUEs     Lower Body  Bathing: Total assistance;Sitting/lateral leans Lower Body Bathing Details (indicate cue type and reason): simulated via LB dressing tasks; total A from sitting EOB     Lower Body Dressing: Total assistance;Sitting/lateral leans Lower Body Dressing Details (indicate cue type and reason): to don socks Toilet Transfer: Maximal assistance;+2 for physical assistance;BSC Toilet Transfer Details (indicate cue type and reason): MAX A +2 d/t impaired balance and decreased proprioception with HHA, multimodal cues to safely descend onto [facility]         Functional mobility during ADLs: Maximal assistance;+2 for physical assistance;Cueing for sequencing;Cueing for safety (stand pivot transfers only) General ADL Comments: pt continues to present with decreased safety awareness, impaired proprioception needing directional cues during mobility and impaired balance     Vision       Perception     Praxis      Cognition Arousal/Alertness: Awake/alert Behavior During Therapy: Impulsive;Restless Overall Cognitive Status: Impaired/Different from baseline Area of Impairment: Attention;Memory;Following commands;Safety/judgement;Awareness;Problem solving                   Current Attention Level: Divided Memory: Decreased recall of precautions;Decreased short-term memory Following Commands: Follows one step commands with increased time;Follows one step commands inconsistently (following commands ~ 10 % of session) Safety/Judgement: Decreased awareness of safety;Decreased awareness of deficits Awareness: Intellectual Problem Solving: Slow processing;Decreased initiation;Difficulty sequencing;Requires verbal cues;Requires tactile cues General Comments: pt with impaired ability to follow commands, very distracted by external stimuli noted to grab at lines, trach site and various other items. pt requried mulitmodal cues realted to mobility with pt presenting with impaired proprioception.         Exercises   Shoulder Instructions       General Comments pt on trach collar 35% FiO2 at 8L/min, SpO2 >/= 95%; noted trach slipping thus immediately notified RN who assessed and tightened and deemed safe to continue treatment session    Pertinent Vitals/ Pain       Pain Assessment: Faces Faces Pain Scale: Hurts a little bit Pain Location: trach site Pain Descriptors / Indicators: Discomfort;Grimacing Pain Intervention(s): Limited activity within patient's tolerance;Monitored during session;Repositioned;Other (comment) (alerted RN)  Home Living                                          Prior Functioning/Environment              Frequency  Min 2X/week        Progress Toward Goals  OT Goals(current goals can now be found in the care plan section)  Progress towards OT goals: Progressing toward goals  Acute Rehab OT Goals Patient Stated Goal: unable to state this date OT Goal Formulation: With patient Time For Goal Achievement: 03/07/20 Potential to Achieve Goals: Good  Plan Discharge plan remains appropriate;Frequency remains appropriate    Co-evaluation      Reason for Co-Treatment: Complexity of the patient's impairments (multi-system involvement);For patient/therapist safety;To address functional/ADL transfers;Necessary to address cognition/behavior during functional activity PT goals addressed during session: Mobility/safety with mobility;Balance OT goals addressed during session: ADL's and self-care;Proper use of [employer] equipment and DME      AM-PAC OT "6 Clicks" Daily Activity     Outcome Measure   Help from another person eating meals?: Total Help from another person taking  care of personal grooming?: A Lot Help from another person toileting, which includes using toliet, bedpan, or urinal?: A Lot Help from another person bathing (including washing, rinsing, drying)?: A Lot Help from another person to put on and taking off regular upper  body clothing?: A Lot Help from another person to put on and taking off regular lower body clothing?: Total 6 Click Score: 10    End of Session Equipment Utilized During Treatment: Gait belt;Oxygen;Other (comment) (35% fiO2 8LPM)  OT Visit Diagnosis: Unsteadiness on feet (R26.81);Muscle weakness (generalized) (M62.81);Other abnormalities of gait and mobility (R26.89) Hemiplegia - Right/Left: Right Hemiplegia - dominant/non-dominant: Dominant Hemiplegia - caused by: Nontraumatic intracerebral hemorrhage   Activity Tolerance Patient tolerated treatment well   Patient Left in chair;with call bell/phone within reach;with chair alarm set;with restraints reapplied   Nurse Communication Mobility status        Time: 4314-2767 OT Time Calculation (min): 30 min  Charges: OT General Charges $OT Visit: 1 Visit OT Treatments $Therapeutic Activity: 8-22 mins  Lanier Clam., COTA/L Acute Rehabilitation Services [phone] Anna 03/06/2020, 10:32 AM

## 2020-03-06 NOTE — Progress Notes (Signed)
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  11/4, pt s/p L far-lateral craniotomy for clipping of PICA aneurysm. 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 12/4 pt with desat and bradycardia and was transferred to ICU and placed on the vent. Pt off vent 12/6.    PT Comments    Treated pt in conjunction with OT to maximize pt safety and quality of session. Pt with increased difficulty following cues compared to previous sessions as she was only able to follow ~10% of commands. She became attentive when her name was spoken, but otherwise easily distracted and impulsive trying to pull at lines. She required minA for bed mob but maxAx2 for any transfers, standing, or small side steps due to a strong R lateral lean this date. She requires extensive physical assistance to shift weight and move legs for steps. Will continue to follow acutely. Current recommendations remain appropriate.   Follow Up Recommendations  CIR;Supervision/Assistance - 24 hour     Equipment Recommendations  Wheelchair (measurements PT);Wheelchair cushion (measurements PT);3in1 (PT)    Recommendations for Other Services       Precautions / Restrictions Precautions Precautions: Fall Precaution Comments: trach, wrist restraints, bil mittens Restrictions Weight Bearing Restrictions: No    Mobility  Bed Mobility Overal bed mobility: Needs Assistance Bed Mobility: Supine to Sit     Supine to sit: HOB elevated;Min assist     General bed mobility comments: HHA to pull trunk to ascend to sit EOB with tactile and verbal cues to manage legs off  EOB.  Transfers Overall transfer level: Needs assistance Equipment used: 2 person hand held assist Transfers: Sit to/from [facility] Sit to Stand: Max assist;+2 physical assistance;+2 safety/equipment Stand pivot transfers: Max assist;+2 physical assistance;+2 safety/equipment       General transfer comment: Pt with strong R lean/push this date. MaxAx2 with bilat knee block and extensive multi-modal cues for weight shifting to take side steps to transfer 1x to L to commode and 1x to R to chair. Increased ease going towards R.  Ambulation/Gait Ambulation/Gait assistance: Max assist;+2 physical assistance;+2 safety/equipment Gait Distance (Feet): 2 Feet Assistive device: 2 person hand held assist Gait Pattern/deviations: Decreased stride length;Shuffle;Ataxic;Staggering right;Trunk flexed;Narrow base of support Gait velocity: decreased Gait velocity interpretation: <1.31 ft/sec, indicative of household ambulator General Gait Details: Several side steps to transfer with UE support on therapist and bilat knee block. MaxAx2 to maintain balance and cue weight shift and leg advancement with pt displaying minimal foot clearance and ataxic movements with narrow BOS.   Stairs             Wheelchair Mobility    Modified Rankin (Stroke Patients Only) Modified Rankin (Stroke Patients Only) Pre-Morbid Rankin Score: No symptoms Modified Rankin: Moderately severe disability     Balance Overall balance assessment: Needs assistance Sitting-balance support: Bilateral upper extremity supported;Feet supported Sitting balance-Leahy Scale: Poor Sitting balance - Comments: UE support with pt leaning strongly to R, initially requiring minA but as pt fatigued she required mod-maxA to maintain balance. Max tactile and verbal cues at trunk, neck, and R arm to find and maintain midline, without success. Postural control: Right lateral lean Standing balance support:  During functional  activity;Bilateral upper extremity supported Standing balance-Leahy Scale: Zero Standing balance comment: MaxAx2 with bilat knee block and UE support to maintain balance due to strong R lean.                            Cognition Arousal/Alertness: Awake/alert Behavior During Therapy: Impulsive Overall Cognitive Status: Impaired/Different from baseline Area of Impairment: Attention;Memory;Following commands;Safety/judgement;Awareness;Problem solving                   Current Attention Level: Divided Memory: Decreased recall of precautions;Decreased short-term memory Following Commands: Follows one step commands with increased time;Follows one step commands inconsistently (~10%) Safety/Judgement: Decreased awareness of safety;Decreased awareness of deficits Awareness: Intellectual Problem Solving: Slow processing;Decreased initiation;Difficulty sequencing;Requires verbal cues;Requires tactile cues General Comments: Pt with increased difficulty following commands as she would become attentive when her name was stated but then lose attention. Required repeated multi-modal cues for pt to respond correctly ~10% of time. Pt very impulsive with reaching and attempting to pull lines, despite max cues otherwise. Pt not aware of deficits and how to correct herself to [facility] safety.      Exercises Other Exercises Other Exercises: Stretch to R lateral trunk flexors to attempt to obtain midline sitting, but unsuccessful Other Exercises: Stretch of neck towards L laterally to maintain midline, x2 ~15-30 sec, min success    General Comments General comments (skin integrity, edema, etc.): pt on trach collar 35% FiO2 at 8L/min, SpO2 >/= 95%; noted trach slipping thus immediately notified RN who assessed and tightened and deemed safe to continue treatment session      Pertinent Vitals/Pain Pain Assessment: Faces Faces Pain Scale: Hurts a little bit Pain Location: trach site Pain  Descriptors / Indicators: Discomfort;Grimacing Pain Intervention(s): Limited activity within patient's tolerance;Monitored during session;Repositioned    Home Living                      Prior Function            PT Goals (current goals can now be found in the care plan section) Acute Rehab PT Goals Patient Stated Goal: unable to state this date PT Goal Formulation: With patient Time For Goal Achievement: 03/14/20 Potential to Achieve Goals: Good Progress towards PT goals: Not progressing toward goals - comment    Frequency    Min 4X/week      PT Plan Current plan remains appropriate    Co-evaluation PT/OT/SLP Co-Evaluation/Treatment: Yes Reason for Co-Treatment: Complexity of the patient's impairments (multi-system involvement);Necessary to address cognition/behavior during functional activity;For patient/therapist safety;To address functional/ADL transfers PT goals addressed during session: Mobility/safety with mobility;Balance        AM-PAC PT "6 Clicks" Mobility   Outcome Measure  Help needed turning from your back to your side while in a flat bed without using bedrails?: A Little Help needed moving from lying on your back to sitting on the side of a flat bed without using bedrails?: A Little Help needed moving to and from a bed to a chair (including a wheelchair)?: A Lot Help needed standing up from a chair using your arms (e.g., wheelchair or bedside chair)?: A Lot Help needed to walk in hospital room?: A Lot Help needed climbing 3-5 steps with a railing? : Total 6 Click Score: 13    End of Session Equipment Utilized During Treatment: Gait belt;Oxygen Activity Tolerance: Patient tolerated treatment well Patient left: in chair;with call bell/phone within  reach;with chair alarm set;with restraints reapplied Nurse Communication: Mobility status;Precautions PT Visit Diagnosis: Hemiplegia and hemiparesis;Other symptoms and signs involving the nervous  system (R29.898);Other abnormalities of gait and mobility (R26.89);Unsteadiness on feet (R26.81);Muscle weakness (generalized) (M62.81);Difficulty in walking, not elsewhere classified (R26.2) Hemiplegia - Right/Left: Left Hemiplegia - dominant/non-dominant: Non-dominant Hemiplegia - caused by: Other Nontraumatic intracranial hemorrhage     Time: 0837-0912 PT Time Calculation (min) (ACUTE ONLY): 35 min  Charges:  $Therapeutic Activity: 8-22 mins                     Moishe Spice, PT, DPT Acute Rehabilitation Services  Pager: [phone] Office: [place] 03/06/2020, 10:09 AM

## 2020-03-07 DIAGNOSIS — I69891 Dysphagia following other cerebrovascular disease: Secondary | ICD-10-CM

## 2020-03-07 LAB — GLUCOSE, CAPILLARY
Glucose-Capillary: 101 mg/dL — ABNORMAL HIGH (ref 70–99)
Glucose-Capillary: 117 mg/dL — ABNORMAL HIGH (ref 70–99)
Glucose-Capillary: 121 mg/dL — ABNORMAL HIGH (ref 70–99)
Glucose-Capillary: 129 mg/dL — ABNORMAL HIGH (ref 70–99)
Glucose-Capillary: 148 mg/dL — ABNORMAL HIGH (ref 70–99)
Glucose-Capillary: 92 mg/dL (ref 70–99)

## 2020-03-07 NOTE — Progress Notes (Signed)
[facility] PROGRESS NOTE  Stacy Moran LDJ:[id number] DOB: 05-11-1955 DOA: 01/30/2020 PCP: No primary care provider on file.       Status: Remains inpatient appropriate because:Altered mental status, Unsafe d/c plan, IV treatments appropriate due to intensity of illness or inability to take PO and Inpatient level of care appropriate due to severity of illness   Dispo: The patient is from: Home              Anticipated d/c is to: SNF              Anticipated d/c date is: > 3 days              Patient currently is not medically stable to d/c.  Barriers to discharge include altered mentation, anticipation trach downsizing/capping and potential for decannulation will be a long process; requirement for PEG tube feedings.  Therapies recommending either CIR or SNF pending funding.  Patient has no insurance and has been deemed Medicaid potential  Code Status: Full Family Communication: 12/9 daughter Stacy Moran by telephone DVT prophylaxis: Subcutaneous heparin Vaccination status: No documentation patient has received Covid vaccine  Foley catheter: No;  pure wick device   HPI: Patient admitted on 11/3 other neurosurgical service after presenting with severe headache.  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  She was hypertensive in the ER and was initially awake and following commands but became lethargic after administration of fentanyl with some improvement after Narcan.  Patient was intubated and transferred to the neuro ICU for further care.  Patient has no significant past medical history.  During the hospitalization patient underwent a craniotomy on 11/4.  Was unable to be safely extubated and underwent tracheostomy placement on 11/12.  Hospital course complicated by Citrobacter and Haemophilus influenza tracheobronchitis.  Patient was also noted with a large thyroid mass and  underwent total thyroidectomy as well on 11/12.  She developed bleeding around the trach site on 11/21 and ENT was consulted.  2 days later on 11/23 a new subglottic obstruction was observed on modified barium swallow and CT did not reveal any signs of obstruction or abscess.  Continues to fail swallowing evaluation so PEG was placed.  She briefly required mechanical ventilation on 12/4 and has subsequently been weaned to a trach collar and was moved out of the ICU to PCU 3 W. on 12/7.  In addition to the above patient has also had issues regarding desaturations related to mucous plugging.  On 12/7 palliative care team met with patient and spoke with daughter during evaluation by telephone.  Currently remains a full code with daughter Stacy Moran is surrogate decision-maker with patient being delegated primary decision maker.  Significant events 11/03 Admitted 11/04 craniotomy with clipping of left PICA 11/12 total thyroidectomy, tracheostomy 11/13 start ABx for fever and tracheobronchitis 11/16 Now 11 days post clipping.  Drain stopped yesterday so left clamped.  Was interactive with PT yesterday. This morning, agitated and complaining of back pain. More somnolent following fentanyl this morning. Stable from neuro check  11/21 pulm called back again;  trach bleeding. we called ENT 11/23 subglottic obstruction which is new observed on MBS-->CT neck ordered to r/o infection. Also abx started. As she failed MBS IR consulted for PEG. Should be last day for nimodipine. We asked IM to assist w. Care  12/4:  Put on mechanical ventilation and transferred to ICU 12/6 tolerating PS  Subjective: Awakened during my intervention with  her early in the morning she appeared to stay more awake tracking me with her right eye and did briefly follow commands with her feet by wiggling her toes.  Seem to be frustrated regarding slow progress in regards to waking up in regaining any type of independence.  Also frustrated  regarding trach tube and difficulty communicating  Objective: Vitals:   03/07/20 0337 03/07/20 1042  BP: 135/78 (!) 159/11  Pulse: 73   Resp: 14   Temp: 98.3 F (36.8 C)   SpO2: 97%     Intake/Output Summary (Last 24 hours) at 03/07/2020 1142 Last data filed at 03/07/2020 3267 Gross per 24 hour  Intake 2009.74 ml  Output 1225 ml  Net 784.74 ml   Filed Weights   03/04/20 1658 03/05/20 0326 03/06/20 0500  Weight: 53.9 kg 54.3 kg 59.7 kg    Exam:  Constitutional: NAD, calm, appears to be comfortable Neck: Tracheostomy tube in place, yellow-green secretions at insertion site Respiratory: Coarse to auscultation bilaterally, no wheezing, no crackles. Normal respiratory effort. No accessory muscle use.  Midline tracheostomy tube in place with 21% FiO2 per trach collar Cardiovascular: Regular rate and rhythm, no murmurs / rubs / gallops. No extremity edema.  Extremities with appropriate capillary refill Abdomen: no tenderness, no masses palpated. . Bowel sounds positive.  PEG tube for tube feedings at 60 cc/h Musculoskeletal: No joint deformity upper and lower extremities. no contractures.  Appears to have normal muscle tone but difficult to assess secondary to patient's lethargy.  Skin: no rashes, lesions, ulcers. No induration Neurologic: CN 2-12 grossly intact although does have mild disconjugate gaze and appearance of cataract on left eye. Sensation grossly intact, unable to adequately assess strength given patient lethargy and inability to consistently participate with exam-she did wiggle toes on both feet today when asked Psychiatric: Awakens to name.  Trach in place and unable to phonate therefore psychiatric exam limited.  Somewhat lethargic.  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 -POD 22; has completed course of nimodipine -Highly  deconditioned; not appear to have any focal neurological deficits -Continue PT and OT-current recommendation is for CIR although depending on participation may be better suited for SNF tenuous to require max assist for activities during therapy sessions -SLP consulted for evaluation re: language and cognition  Acute on chronic respiratory failure (multifactorial) 1) tracheostomy dependent 2) recurrent mucous plugging and history of polymicrobial tracheobronchitis this admission -Continues to have thick yellow-green secretions oozing out of trach insertion site therefore will culture-preliminary with moderate gram-negative rods and abundant staph aureus-final culture -suspect colonization but need final culture results to make definitive decision regarding antibiotic initiation or possible tracheobronchitis  -Current wound care to trach:.  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 -Mass caused tracheal compression -Continue Synthroid  Metabolic encephalopathy -Multifactorial secondary to acute critical illness as well as recent neurological bleed -Patient having issues with agitation and confusion; continue Seroquel at bedtime as well as IV Haldol as needed for significant agitation -Continue to push mobility with PT and OT and encourage socialization -Continue wrist restraints to prevent patient from accidentally pulling out tracheostomy tube or PEG  Dysphagia/mild to moderate protein calorie malnutrition -Continue tube feedings per PEG -Continue Prosource tube feeding liquid Nutrition Problem: Inadequate oral intake Etiology:  acute illness Signs/Symptoms: estimated needs Interventions: Prostat,Tube feeding Estimated body mass index is 19.44 kg/m as calculated from the following:   Height as of this  encounter: 5' 9"  (1.753 m).   Weight as of this encounter: 59.7 kg.     Incision (Closed) 01/31/20 Head (Active)  Date First Assessed/Time First Assessed: 01/31/20 0138   Location: Head    Assessments 01/31/2020  1:00 AM 03/02/2020  8:00 PM  Dressing Type -- None  Site / Wound Assessment -- Clean;Dry  Margins Attached edges (approximated) Attached edges (approximated)  Closure Staples Staples  Drainage Amount -- None  Treatment -- Off loading     No Linked orders to display     Incision (Closed) 02/08/20 Neck Other (Comment) (Active)  Date First Assessed/Time First Assessed: 02/08/20 1335   Location: Neck  Location Orientation: Other (Comment)    Assessments 02/10/2020 12:00 AM 03/04/2020  7:41 PM  Dressing Type Gauze (Comment) None  Dressing Changed Intact  Dressing Change Frequency PRN --  Site / Wound Assessment Clean Clean;Dry  Closure None None  Drainage Amount Minimal --  Drainage Description Serosanguineous --  Treatment Cleansed --     No Linked orders to display     Wound / Incision (Open or Dehisced) 02/08/20 Skin tear Back Left pink (Active)  Date First Assessed/Time First Assessed: 02/08/20 2000   Wound Type: Skin tear  Location: Back  Location Orientation: Left  Wound Description (Comments): pink    Assessments 02/08/2020  8:00 PM 03/05/2020 10:00 AM  Dressing Type Foam - Lift dressing to assess site every shift --  Dressing Status Clean;Dry;Intact --  Site / Wound Assessment Pink --  Peri-wound Assessment Intact --  Wound Length (cm) -- 0 cm  Wound Width (cm) -- 0 cm  Wound Depth (cm) -- 0 cm  Wound Volume (cm^3) -- 0 cm^3  Wound Surface Area (cm^2) -- 0 cm^2  Drainage Amount None --  Treatment Cleansed --     No Linked orders to display     Pressure Injury 02/12/20 Throat Mid Stage 2 -  Partial thickness loss of dermis presenting as a shallow open injury with a red, pink wound bed without slough. (Active)  Date First Assessed/Time First Assessed:  02/12/20 1500   Location: Throat  Location Orientation: Mid  Staging: Stage 2 -  Partial thickness loss of dermis presenting as a shallow open injury with a red, pink wound bed without slough.  Present on Admis...    Assessments 02/12/2020  8:00 AM 03/06/2020  8:00 PM  Dressing Type Foam - Lift dressing to assess site every shift --  Dressing Change Frequency PRN --  Site / Wound Assessment Red;Painful;Other (Comment) --  Peri-wound Assessment Intact;Erythema (blanchable) --  Margins Unattached edges (unapproximated) Unattached edges (unapproximated)  Drainage Amount Minimal Moderate  Drainage Description Serosanguineous Serosanguineous  Treatment Cleansed --     No Linked orders to display     Hypertension -New diagnosis this admission -Continue Norvasc, labetalol and hydralazine    Other problems: Diabetes mellitus 2 with mild hyperglycemia -New diagnosis this admission-globin A1c 6.2 -Continue Lantus and sliding scale insulin-of note hemoglobin A1c only mildly elevated therefore if able to transition to orally ingested diet recommend DC insulin in favor of oral hypoglycemic agents  Anemia of chronic illness -Current hemoglobin 8.8 -She did have issues with trach site bleeding that may have also contributed to anemia    Data Reviewed: Basic Metabolic Panel: Recent Labs  Lab 03/01/20 0712 03/04/20 0138  NA 137 138  K 4.3 4.2  CL 94* 98  CO2 31 30  GLUCOSE 97 148*  BUN 10 11  CREATININE 0.51 0.56  CALCIUM 8.6* 8.9  MG 2.2  --   PHOS 3.6  --    Liver Function Tests: No results for input(s): AST, ALT, ALKPHOS, BILITOT, PROT, ALBUMIN in the last 168 hours. No results for input(s): LIPASE, AMYLASE in the last 168 hours. No results for input(s): AMMONIA in the last 168 hours. CBC: Recent Labs  Lab 03/01/20 0712 03/04/20 0138  WBC 7.0 4.9  HGB 8.1* 8.8*  HCT 24.8* 29.5*  MCV 82.9 86.5  PLT 177 157   Cardiac Enzymes: No results for input(s): CKTOTAL, CKMB,  CKMBINDEX, TROPONINI in the last 168 hours. BNP (last 3 results) No results for input(s): BNP in the last 8760 hours.  ProBNP (last 3 results) No results for input(s): PROBNP in the last 8760 hours.  CBG: Recent Labs  Lab 03/06/20 1534 03/06/20 2001 03/06/20 2346 03/07/20 0315 03/07/20 0749  GLUCAP 153* 136* 127* 148* 101*    Recent Results (from the past 240 hour(s))  MRSA PCR Screening     Status: None   Collection Time: 03/01/20  4:12 AM   Specimen: Nasal Mucosa; Nasopharyngeal  Result Value Ref Range Status   MRSA by PCR NEGATIVE NEGATIVE Final    Comment:        The GeneXpert MRSA Assay (FDA approved for NASAL specimens only), is one component of a comprehensive MRSA colonization surveillance program. It is not intended to diagnose MRSA infection nor to guide or monitor treatment for MRSA infections. Performed at [facility], [place] [address] [place], [place] [address]   Aerobic/Anaerobic Culture (surgical/deep wound)     Status: None   Collection Time: 03/01/20  6:01 AM   Specimen: Trachea; Wound  Result Value Ref Range Status   Specimen Description TRACHEAL SITE  Final   Special Requests NONE  Final   Gram Stain   Final    FEW WBC PRESENT, PREDOMINANTLY PMN ABUNDANT GRAM NEGATIVE RODS MODERATE GRAM POSITIVE COCCI FEW GRAM VARIABLE ROD Performed at [facility], [place] [address], [place], [place] [address]    Culture   Final    MODERATE STREPTOCOCCUS GROUP F Beta hemolytic streptococci are predictably susceptible to penicillin and other beta lactams. Susceptibility testing not routinely performed. WITHIN MIXED ORGNISMS MIXED ANAEROBIC FLORA PRESENT.  CALL LAB IF FURTHER IID REQUIRED.    Report Status 03/06/2020 FINAL  Final  Culture, respiratory (non-expectorated)     Status: None (Preliminary result)   Collection Time: 03/05/20 12:01 PM   Specimen: Tracheal Aspirate; Respiratory  Result Value Ref Range Status   Specimen Description TRACHEAL  ASPIRATE  Final   Special Requests NONE  Final   Gram Stain   Final    RARE WBC PRESENT,BOTH PMN AND MONONUCLEAR MODERATE GRAM NEGATIVE RODS ABUNDANT GRAM POSITIVE COCCI    Culture   Final    ABUNDANT STAPHYLOCOCCUS AUREUS SUSCEPTIBILITIES TO FOLLOW CULTURE REINCUBATED FOR BETTER GROWTH Performed at [facility], [place] [address] [place], [place] [address]    Report Status PENDING  Incomplete     Studies: No results found.  Scheduled Meds: . amLODipine  10 mg Per Tube Daily  . chlorhexidine gluconate (MEDLINE KIT)  15 mL Mouth Rinse BID  . Chlorhexidine Gluconate Cloth  6 each Topical Daily  . docusate  100 mg Per Tube BID  . feeding supplement (PROSource  TF)  45 mL Per Tube BID  . heparin  5,000 Units Subcutaneous Q8H  . hydrALAZINE  25 mg Per Tube Q8H  . insulin aspart  0-20 Units Subcutaneous Q4H  . labetalol  300 mg Per Tube TID  . levothyroxine  100 mcg Per Tube Q0600  . mouth rinse  15 mL Mouth Rinse 10 times per day  . pantoprazole sodium  40 mg Per Tube Daily  . polyethylene glycol  17 g Per Tube Daily  . polyvinyl alcohol  1 drop Left Eye BID  . QUEtiapine  50 mg Per Tube QHS  . sodium chloride flush  10-40 mL Intracatheter Q12H   Continuous Infusions: . feeding supplement (OSMOLITE 1.2 CAL) 1,000 mL (03/06/20 1037)  . 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   Accelerated hypertension   H/O total thyroidectomy   Tracheostomy status ([place])   Acute respiratory failure ([place])   Ventilator dependence [facility])   Consultants:  Neurosurgery  PCCM  ENT  Daily take meds  Procedures: 11/03 ETT >>11/09 11/03 EVD >> 11/09 ETT >> 11/12 11/12 Tracheostomy >>  Antibiotics: Anti-infectives (From admission, onward)   Start     Dose/Rate Route Frequency Ordered Stop   02/22/20 1845   ceFAZolin (ANCEF) IVPB 2g/100 mL premix  Status:  Discontinued        2 g 200 mL/hr over 30 Minutes Intravenous  Once 02/22/20 1758 03/01/20 0817   02/22/20 1522  ceFAZolin (ANCEF) IVPB 2g/100 mL premix        over 30 Minutes  Continuous PRN 02/22/20 1523 02/22/20 1510   02/22/20 1514  ceFAZolin (ANCEF) 2-4 GM/100ML-% IVPB       Note to Pharmacy: Luis Abed   : cabinet override      02/22/20 1514 02/23/20 0329   02/19/20 1500  ceFEPIme (MAXIPIME) 2 g in sodium chloride 0.9 % 100 mL IVPB        2 g 200 mL/hr over 30 Minutes Intravenous Every 12 hours 02/19/20 1408 02/26/20 0836   02/10/20 1445  cefTRIAXone (ROCEPHIN) 2 g in sodium chloride 0.9 % 100 mL IVPB        2 g 200 mL/hr over 30 Minutes Intravenous Every 24 hours 02/10/20 1348 02/15/20 1624   02/09/20 1500  piperacillin-tazobactam (ZOSYN) IVPB 3.375 g  Status:  Discontinued        3.375 g 12.5 mL/hr over 240 Minutes Intravenous Every 8 hours 02/09/20 0755 02/10/20 1348   02/09/20 0845  piperacillin-tazobactam (ZOSYN) IVPB 3.375 g        3.375 g 100 mL/hr over 30 Minutes Intravenous  Once 02/09/20 0755 02/09/20 1027       Time spent: 20 minutes    Erin Hearing ANP  [facility] Pager [phone].

## 2020-03-07 NOTE — Progress Notes (Signed)
SLP Cancellation Note  Patient Details Name: Stacy Moran MRN: [id number] DOB: 01-14-1956   Cancelled treatment:        Cognitive linguistic orders received and appreciated.  SLP attempted evaluation, but pt was unable to maintain alertness to participate. Pt withdrew to noxious stimulus (wet washcloth), and nodded head when asked if her name was Stacy Moran.  Pt did not respond to any other stimuli.  Pt did not follow directions or answer other yes/no questions.  Pt minimally opened eyes after washcloth applied to face, but otherwise did not indicate alertness.  SLP will reattempt when pt is able to participate.    Celedonio Savage, MA, [place] Office: [phone]; Pager (12/10): [phone] 03/07/2020, 10:39 AM

## 2020-03-07 NOTE — Progress Notes (Signed)
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  11/4, pt s/p L far-lateral craniotomy for clipping of PICA aneurysm. 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 12/4 pt with desat and bradycardia and was transferred to ICU and placed on the vent. Pt off vent 12/6.    PT Comments    Pt lethargic upon arrival, resulting in her requiring increased assistance of modAx2 this date to come to sit EOB. However, once sitting up she became more awake but still demonstrates difficulty following cues (follows ~10% of multi-modal cues). Even attempted use of mirror to encourage midline alignment in sitting and to decrease pushing from L towards R, but no success. Thus, placed pt laterally on L elbow sitting without feet support to discourage pushing and then progressed to L hand on bed lateral to body then medially placed to body. Noted significantly decreased pushing but still slight lean present. She required minAx1 and modAx1 to come to stand and transfer to the R to the chair this date with cues for shifting weight and advancing legs. Will continue to follow acutely. Pt would greatly benefit from intensive therapy in CIR setting to address her deficits and maximize her independence and safety with all functional mobility.    Follow Up Recommendations  CIR;Supervision/Assistance - 24 hour     Equipment Recommendations  Wheelchair (measurements PT);Wheelchair cushion (measurements PT);3in1 (PT)    Recommendations for Other Services       Precautions / Restrictions Precautions Precautions: Fall Precaution Comments: trach, wrist restraints, bil  mittens Restrictions Weight Bearing Restrictions: No    Mobility  Bed Mobility Overal bed mobility: Needs Assistance Bed Mobility: Supine to Sit     Supine to sit: HOB elevated;+2 for physical assistance;Mod assist     General bed mobility comments: HHA to pull trunk to ascend to sit EOB with tactile and verbal cues to manage legs off EOB, modAx2 to manage trunk and legs this date as pt was initially lethargic and not moving legs when cued.  Transfers Overall transfer level: Needs assistance Equipment used: 2 person hand held assist Transfers: Sit to/from [facility] Sit to Stand: +2 physical assistance;+2 safety/equipment;Mod assist;Min assist Stand pivot transfers: +2 physical assistance;+2 safety/equipment;Min assist;Mod assist       General transfer comment: Pt with R lateral lean.Cued pt to hold onto therapist for stability during transfer. ModA from therapist anterior to her to initiate sit to stand and to shift weight and advance legs for step transfer to R to chair. MinA from tech posterior to pt to guide buttocks.  Ambulation/Gait Ambulation/Gait assistance: +2 physical assistance;+2 safety/equipment;Mod assist;Min assist Gait Distance (Feet): 2 Feet Assistive device: 2 person hand held assist Gait Pattern/deviations: Decreased stride length;Shuffle;Ataxic;Staggering right;Trunk flexed;Narrow base of support Gait velocity: decreased Gait velocity interpretation: <1.31 ft/sec, indicative of household ambulator General Gait Details: Several side steps to transfer with UE support on therapist. ModAx1 and minAx1 to maintain balance and cue weight shift and leg advancement with pt displaying minimal foot clearance and ataxic movements with narrow BOS.   Stairs             Wheelchair Mobility    Modified Rankin (Stroke Patients Only) Modified  Rankin (Stroke Patients Only) Pre-Morbid Rankin Score: No symptoms Modified Rankin: Moderately severe  disability     Balance Overall balance assessment: Needs assistance Sitting-balance support: Bilateral upper extremity supported;Feet supported Sitting balance-Leahy Scale: Poor Sitting balance - Comments: Initially requiring maxA with UE support as pt pushes strongly with L arm to push trunk to R. Attempted cuing pt to correct posture with use of mirror but pt not paying attention to mirror or following cues. Thus, placed pt leaning laterally onto L elbow then transition to L hand lateral to body then placed medial to body to decrease pushing, with mod success and noted some but significantly decreased pushing afterwards. Postural control: Right lateral lean Standing balance support: During functional activity;Bilateral upper extremity supported Standing balance-Leahy Scale: Poor Standing balance comment: ModAx1 and minAx1 and UE support to maintain balance due to strong R lean.                            Cognition Arousal/Alertness: Awake/alert Behavior During Therapy: Impulsive Overall Cognitive Status: Impaired/Different from baseline Area of Impairment: Attention;Memory;Following commands;Safety/judgement;Awareness;Problem solving                   Current Attention Level: Divided Memory: Decreased recall of precautions;Decreased short-term memory Following Commands: Follows one step commands with increased time;Follows one step commands inconsistently (~10%) Safety/Judgement: Decreased awareness of safety;Decreased awareness of deficits Awareness: Intellectual Problem Solving: Slow processing;Decreased initiation;Difficulty sequencing;Requires verbal cues;Requires tactile cues General Comments: Pt with increased difficulty following commands as she would become attentive when her name was stated but then lose attention. Required repeated multi-modal cues for pt to respond correctly ~10% of time. Pt very impulsive with reaching and attempting to pull lines, despite  max cues otherwise. Pt not aware of deficits and how to correct herself to [employer] safety.      Exercises Other Exercises Other Exercises: Stretch to R lateral trunk flexors to attempt to obtain midline sitting, but unsuccessful Other Exercises: Stretch of neck towards L laterally to maintain midline, x2 ~15-30 sec, min success    General Comments        Pertinent Vitals/Pain Pain Assessment: Faces Faces Pain Scale: Hurts a little bit Pain Location: trach site Pain Descriptors / Indicators: Discomfort;Grimacing Pain Intervention(s): Limited activity within patient's tolerance;Monitored during session;Repositioned    Home Living                      Prior Function            PT Goals (current goals can now be found in the care plan section) Acute Rehab PT Goals Patient Stated Goal: unable to state this date PT Goal Formulation: With patient Time For Goal Achievement: 03/14/20 Potential to Achieve Goals: Good    Frequency    Min 4X/week      PT Plan Current plan remains appropriate    Co-evaluation              AM-PAC PT "6 Clicks" Mobility   Outcome Measure  Help needed turning from your back to your side while in a flat bed without using bedrails?: A Lot Help needed moving from lying on your back to sitting on the side of a flat bed without using bedrails?: A Lot Help needed moving to and from a bed to a chair (including a wheelchair)?: A Lot Help needed standing up from a chair using your arms (e.g., wheelchair or bedside chair)?: A  Lot Help needed to walk in hospital room?: A Lot Help needed climbing 3-5 steps with a railing? : Total 6 Click Score: 11    End of Session Equipment Utilized During Treatment: Gait belt;Oxygen (5L/min FiO2 28%) Activity Tolerance: Patient tolerated treatment well Patient left: in chair;with call bell/phone within reach;with chair alarm set;with restraints reapplied Nurse Communication: Mobility  status;Precautions;Other (comment) (saturation of bandages inferior to trach) PT Visit Diagnosis: Hemiplegia and hemiparesis;Other symptoms and signs involving the nervous system (R29.898);Other abnormalities of gait and mobility (R26.89);Unsteadiness on feet (R26.81);Muscle weakness (generalized) (M62.81);Difficulty in walking, not elsewhere classified (R26.2) Hemiplegia - Right/Left: Left Hemiplegia - dominant/non-dominant: Non-dominant Hemiplegia - caused by: Other Nontraumatic intracranial hemorrhage     Time: 1001-1026 PT Time Calculation (min) (ACUTE ONLY): 25 min  Charges:  $Therapeutic Activity: 8-22 mins $Neuromuscular Re-education: 8-22 mins                     Moishe Spice, PT, DPT Acute Rehabilitation Services  Pager: [phone] Office: [phone]    Orvan Falconer 03/07/2020, 11:40 AM

## 2020-03-07 NOTE — Plan of Care (Signed)
  Problem: Education: Goal: Knowledge of disease or condition will improve Outcome: Progressing Goal: Knowledge of secondary prevention will improve Outcome: Progressing Goal: Knowledge of patient specific risk factors addressed and post discharge goals established will improve Outcome: Progressing   Problem: Spontaneous Subarachnoid Hemorrhage Tissue Perfusion: Goal: Complications of Spontaneous Subarachnoid Hemorrhage will be minimized Outcome: Progressing   Problem: Pain Managment: Goal: General experience of comfort will improve Outcome: Progressing   Problem: Safety: Goal: Ability to remain free from injury will improve Outcome: Progressing

## 2020-03-08 DIAGNOSIS — R739 Hyperglycemia, unspecified: Secondary | ICD-10-CM

## 2020-03-08 DIAGNOSIS — Z931 Gastrostomy status: Secondary | ICD-10-CM

## 2020-03-08 LAB — CBC
HCT: 30.9 % — ABNORMAL LOW (ref 36.0–46.0)
Hemoglobin: 9.5 g/dL — ABNORMAL LOW (ref 12.0–15.0)
MCH: 26.5 pg (ref 26.0–34.0)
MCHC: 30.7 g/dL (ref 30.0–36.0)
MCV: 86.1 fL (ref 80.0–100.0)
Platelets: 149 10*3/uL — ABNORMAL LOW (ref 150–400)
RBC: 3.59 MIL/uL — ABNORMAL LOW (ref 3.87–5.11)
RDW: 19.8 % — ABNORMAL HIGH (ref 11.5–15.5)
WBC: 5.9 10*3/uL (ref 4.0–10.5)
nRBC: 0.3 % — ABNORMAL HIGH (ref 0.0–0.2)

## 2020-03-08 LAB — BASIC METABOLIC PANEL
Anion gap: 11 (ref 5–15)
BUN: 16 mg/dL (ref 8–23)
CO2: 26 mmol/L (ref 22–32)
Calcium: 9.3 mg/dL (ref 8.9–10.3)
Chloride: 99 mmol/L (ref 98–111)
Creatinine, Ser: 0.48 mg/dL (ref 0.44–1.00)
GFR, Estimated: >60 mL/min (ref >60)
Glucose, Bld: 129 mg/dL — ABNORMAL HIGH (ref 70–99)
Potassium: 4 mmol/L (ref 3.5–5.1)
Sodium: 136 mmol/L (ref 135–145)

## 2020-03-08 LAB — GLUCOSE, CAPILLARY
Glucose-Capillary: 115 mg/dL — ABNORMAL HIGH (ref 70–99)
Glucose-Capillary: 137 mg/dL — ABNORMAL HIGH (ref 70–99)
Glucose-Capillary: 126 mg/dL — ABNORMAL HIGH (ref 70–99)
Glucose-Capillary: 90 mg/dL (ref 70–99)
Glucose-Capillary: 118 mg/dL — ABNORMAL HIGH (ref 70–99)

## 2020-03-08 LAB — CULTURE, RESPIRATORY W GRAM STAIN

## 2020-03-08 NOTE — Progress Notes (Addendum)
PROGRESS NOTE  Stacy Moran MRN:[id number] DOB: 11/10/1955 DOA: 01/30/2020 PCP: No primary care provider on file.  Brief History   64-year-old woman presented with severe headache, found to have large volume subarachnoid hemorrhage secondary to ruptured PICA with resultant hydrocephalus, intubated and admitted by neurosurgery to the ICU, EVD placed, underwent diagnostic cerebral angiogram, aneurysm not amenable to coiling, underwent left craniotomy for clipping of PICA aneurysm.  Failed extubation secondary to large goiter, seen by ENT, underwent tracheostomy and thyroidectomy.  Severe dysphagia persisted and PEG tube placed.  Continue to have issues with significant secretions, seen again by ENT, felt to have very poor sensation larynx and hypopharynx, very high risk for aspiration.  Major barrier to transfer to CIR is tracheostomy secretions.  A & P  Subarachnoid hemorrhage secondary to ruptured left PICA with associated hydrocephalus.  Status post EVD, cerebral angiogram, craniotomy for clipping of aneurysm. --Neurosurgery signed off --Complications include dysphagia, requiring PEG tube placement; tracheostomy dependence; weakness  Tracheostomy dependent, failed extubation attempts secondary to prolonged critical illness, complicated by upper airway obstruction secondary to multinodular goiter, full-thickness tracheostomy wound --Continue trach wound care per wound care nurse. --Underwent direct laryngoscopy 12/3, per ENT continue cuffed tracheostomy tube to protect airway, over next several months continue evaluation to see how the subglottic airway progresses. --No fever or leukocytosis, tracheal secretion culture reflects colonization  Dysphagia, s/p PEG 11/26 --Continue PEG tube feeds  Essential hypertension --stable, continue amlodipine, hydralazine, labetalol  Anemia of acute illness --stable  Hypothyroidism, acquired/postsurgical, status post thyroidectomy this  hospitalization --Continue Synthroid  Hyperglycemia secondary to tube feeds.  Hemoglobin A1c 6.2. --stable, continue SSI  RESOLVED . Hypertensive emergency . Acute respiratory failure secondary to subarachnoid hemorrhage, complicated by thyroid mass compressing trachea . Goiter, status post thyroidectomy . Tracheobronchitis.  fiberoptic examination of 11/30. . Acute on chronic respiratory failure secondary to mucous plugging with episode of bradycardia which responded to deep suctioning  Disposition Plan:  Discussion: Complex case, barrier to discharge is tracheostomy secretions.  Eventually plan for CIR. I have asked CM to explore LTACH.  Status is: Inpatient  Remains inpatient appropriate because:Inpatient level of care appropriate due to severity of illness   Dispo: The patient is from: Home              Anticipated d/c is to: CIR              Anticipated d/c date is: unknnow              Patient currently is not medically stable to d/c.  DVT prophylaxis: heparin injection 5,000 Units Start: 02/05/20 0845 SCD's Start: 01/30/20 0647   Code Status: Full Code Family Communication: updated daughter by telephone   , MD  [facility] Direct contact: see [email or web address] (further directions at bottom of note if needed) 7PM-7AM contact night coverage as at bottom of note 03/08/2020, 4:49 PM  LOS: 38 days   Significant Hospital Events   11/03 Admitted 11/04 craniotomy with clipping of left PICA 11/12 total thyroidectomy, tracheostomy 11/13 start ABx for fever and tracheobronchitis 12/4 bradycardia secondary to copious secretions, transferred to ICU   Consults:  . Neurosurgery . Critical care . ENT . CIR   Procedures:  . ETT 11/3 >  11/9 . 11/3 > 11/16 right frontal ventriculostomy . 11/3 diagnostic cerebral angiogram . 11/12 tracheostomy, total thyroidectomy . 11/26 PEG . 12/3 direct laryngoscopy  Significant Diagnostic Tests:  .    Micro Data:   .    Antimicrobials:  .     Interval History/Subjective  CC: f/u subarachnoid hemorrhage  Seems to feel ok, no complaints communicated via mouth and gestures  Objective   Vitals:  Vitals:   03/08/20 1530 03/08/20 1552  BP: 134/85 134/85  Pulse: 81 78  Resp: 16 18  Temp: 98.8 F (37.1 C)   SpO2:  99%    Exam:  Constitutional:   . Appears calm and comfortable Neck:  . trach Respiratory:  . CTA bilaterally, no w/r/r.  . Respiratory effort normal. Cardiovascular:  . RRR, no m/r/g . No LE extremity edema   Psychiatric:  . Mental status o Mood, affect appear to be appropriate, responds to voice   I have personally reviewed the following:   Today's Data  . CBG stable . BMP unremarkable .   Scheduled Meds: . amLODipine  10 mg Per Tube Daily  . chlorhexidine gluconate (MEDLINE KIT)  15 mL Mouth Rinse BID  . Chlorhexidine Gluconate Cloth  6 each Topical Daily  . docusate  100 mg Per Tube BID  . feeding supplement (PROSource TF)  45 mL Per Tube BID  . heparin  5,000 Units Subcutaneous Q8H  . hydrALAZINE  25 mg Per Tube Q8H  . insulin aspart  0-20 Units Subcutaneous Q4H  . labetalol  300 mg Per Tube TID  . levothyroxine  100 mcg Per Tube Q0600  . mouth rinse  15 mL Mouth Rinse 10 times per day  . pantoprazole sodium  40 mg Per Tube Daily  . polyethylene glycol  17 g Per Tube Daily  . polyvinyl alcohol  1 drop Left Eye BID  . QUEtiapine  50 mg Per Tube QHS  . sodium chloride flush  10-40 mL Intracatheter Q12H   Continuous Infusions: . feeding supplement (OSMOLITE 1.2 CAL) 1,000 mL (03/06/20 1037)  . 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  1. Check the care team in [facility] and look for a) attending/consulting [facility] provider listed and b) the [facility] team listed 2. Log into [email or web address] and use [place]'s universal password to access. If you do not have the password, please contact the hospital operator. 3. Locate the [facility] provider you are looking for under [facility] and page to a number that you can be directly reached. 4. If you still have difficulty reaching the provider, please page the DOC (Director on Call) for the Hospitalists listed on amion for assistance.      

## 2020-03-08 NOTE — Hospital Course (Addendum)
64 year old woman presented with severe headache, found to have large volume subarachnoid hemorrhage secondary to ruptured PICA with resultant hydrocephalus, intubated and admitted by neurosurgery to the ICU, EVD placed, underwent diagnostic cerebral angiogram, aneurysm not amenable to coiling, underwent left craniotomy for clipping of PICA aneurysm.  Failed extubation secondary to large goiter, seen by ENT, underwent tracheostomy and thyroidectomy.  Severe dysphagia persisted and PEG tube placed.  Continue to have issues with significant secretions, seen again by ENT, felt to have very poor sensation larynx and hypopharynx, very high risk for aspiration.  Major barrier to transfer to CIR is tracheostomy secretions.  A & P  Subarachnoid hemorrhage secondary to ruptured left PICA with associated hydrocephalus.  Status post EVD, cerebral angiogram, craniotomy for clipping of aneurysm. --Neurosurgery signed off --Complications include dysphagia, PEG tube placement; tracheostomy dependence; weakness --Continue supportive care  Tracheostomy dependent, failed extubation attempts secondary to prolonged critical illness, complicated by upper airway obstruction secondary to multinodular goiter, full-thickness tracheostomy wound --Continue trach wound care per wound care nurse. --Underwent direct laryngoscopy 12/3, per ENT continue cuffed tracheostomy tube to protect airway, over next several months continue evaluation to see how the subglottic airway progresses. --No fever or leukocytosis, tracheal secretion culture reflects colonization --Continue supportive care, ask ENT to reassess 12/13  Dysphagia, s/p PEG 11/26 --Continue PEG tube feeds  Essential hypertension --Remained stable, continue amlodipine, hydralazine, labetalol  Anemia of acute illness --stable  Hypothyroidism, acquired/postsurgical, status post thyroidectomy this hospitalization --Continue Synthroid  Hyperglycemia secondary to tube  feeds.  Hemoglobin A1c 6.2. --Remains stable, continue SSI  RESOLVED Hypertensive emergency Acute respiratory failure secondary to subarachnoid hemorrhage, complicated by thyroid mass compressing trachea Goiter, status post thyroidectomy Tracheobronchitis.  fiberoptic examination of 11/30. Acute on chronic respiratory failure secondary to mucous plugging with episode of bradycardia which responded to deep suctioning

## 2020-03-08 NOTE — Plan of Care (Signed)
  Problem: Education: Goal: Knowledge of disease or condition will improve Outcome: Progressing Goal: Knowledge of secondary prevention will improve Outcome: Progressing Goal: Knowledge of patient specific risk factors addressed and post discharge goals established will improve Outcome: Progressing   Problem: Spontaneous Subarachnoid Hemorrhage Tissue Perfusion: Goal: Complications of Spontaneous Subarachnoid Hemorrhage will be minimized Outcome: Progressing   Problem: Clinical Measurements: Goal: Ability to maintain clinical measurements within normal limits will improve Outcome: Progressing Goal: Will remain free from infection Outcome: Progressing Goal: Diagnostic test results will improve Outcome: Progressing Goal: Respiratory complications will improve Outcome: Progressing Goal: Cardiovascular complication will be avoided Outcome: Progressing   Problem: Safety: Goal: Non-violent Restraint(s) Outcome: Progressing

## 2020-03-08 NOTE — Progress Notes (Signed)
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  11/4, pt s/p L far-lateral craniotomy for clipping of PICA aneurysm. 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 12/4 pt with desat and bradycardia and was transferred to ICU and placed on the vent. Pt off vent 12/6.    PT Comments    Pt continues to demonstrate strong R lateral lean/pushing, with it improving some when placed leaning onto her L elbow sitting without feet support and progressing to lateral > medial L hand placement on bed. Provided passive stretch into L lateral cervical flexion to try to improve midline positioning, with min momentary success. Pt very lethargic and only following ~5% of commands this date. Therefore, she required maxA for all functional mobility this date. Pt left sitting up in chair with feet down, pillow under her R lateral trunk to increase midline positioning, and restraints reapplied with chair alarm on. Will continue to follow acutely. Current recommendations remain appropriate.   Follow Up Recommendations  CIR;Supervision/Assistance - 24 hour     Equipment Recommendations  Wheelchair (measurements PT);Wheelchair cushion (measurements PT);3in1 (PT)    Recommendations for Other Services       Precautions / Restrictions Precautions Precautions: Fall Precaution Comments: trach, wrist restraints, bil mittens Restrictions Weight Bearing Restrictions: No    Mobility  Bed Mobility Overal bed mobility: Needs Assistance Bed Mobility: Supine to Sit     Supine to sit: HOB elevated;Max assist     General bed mobility comments: Cues and extra time  provided to bring legs off EOB, but instead of bringing them to the R as directed to moved them slightly to the L. HOB elevated with cues to assist with sitting up but maxA to manage legs and trunk.  Transfers Overall transfer level: Needs assistance Equipment used: 1 person hand held assist Transfers: Sit to/from [facility] Sit to Stand: Max assist Stand pivot transfers: Max assist       General transfer comment: Pt with R lateral lean.Cued pt to hold onto therapist for stability during transfer, with min success. MaxA from therapist anterior to her to initiate sit to stand and to shift weight and advance legs for step transfer to R to chair with bilat knee block. Pt showing min initiation to stand and move legs with transfer.  Ambulation/Gait Ambulation/Gait assistance: Max assist Gait Distance (Feet): 2 Feet Assistive device: 1 person hand held assist Gait Pattern/deviations: Decreased stride length;Shuffle;Ataxic;Staggering right;Trunk flexed;Narrow base of support Gait velocity: decreased Gait velocity interpretation: <1.31 ft/sec, indicative of household ambulator General Gait Details: Several side steps to transfer with min UE support on therapist. MaxA to maintain balance and cue weight shift and leg advancement with pt displaying minimal foot clearance and ataxic movements with narrow BOS.   Stairs             Wheelchair Mobility    Modified Rankin (Stroke Patients Only) Modified Rankin (Stroke Patients Only) Pre-Morbid Rankin Score: No symptoms Modified Rankin: Moderately severe disability     Balance Overall balance assessment: Needs assistance Sitting-balance support: Bilateral upper extremity supported;Feet supported Sitting balance-Leahy Scale: Poor Sitting balance - Comments: Initially requiring maxA with UE support as pt pushes strongly with  L arm to push trunk to R. Placed pt leaning laterally onto L elbow then transitioned to L hand  lateral to body then placed medial to body to decrease pushing, with fair success in decreasing pushing and min/modA to maintain balance with all positions. Postural control: Right lateral lean Standing balance support: During functional activity;Bilateral upper extremity supported Standing balance-Leahy Scale: Zero Standing balance comment: MaxA, bilat knee block, and UE support to maintain balance due to strong R lean.                            Cognition Arousal/Alertness: Lethargic Behavior During Therapy: Impulsive Overall Cognitive Status: Impaired/Different from baseline Area of Impairment: Attention;Memory;Following commands;Safety/judgement;Awareness;Problem solving                   Current Attention Level: Divided Memory: Decreased recall of precautions;Decreased short-term memory Following Commands: Follows one step commands with increased time;Follows one step commands inconsistently (~5%) Safety/Judgement: Decreased awareness of safety;Decreased awareness of deficits Awareness: Intellectual Problem Solving: Slow processing;Decreased initiation;Difficulty sequencing;Requires verbal cues;Requires tactile cues General Comments: Pt with increased difficulty following commands as she would become attentive when her name was stated but then lose attention. Required repeated multi-modal cues for pt to respond correctly ~5% of time. Pt intermittently impulsive with reaching and attempting to pull lines, despite cues otherwise. Pt not aware of deficits and how to correct herself to [facility] safety, despite max cues.      Exercises Other Exercises Other Exercises: PROM into L lateral cervical flexion 3x ~30-40 sec each with momentary improved positioning following    General Comments        Pertinent Vitals/Pain Pain Assessment: Faces Faces Pain Scale: Hurts a little bit Pain Location: trach site and neck with stretching Pain Descriptors / Indicators:  Discomfort;Grimacing;Guarding Pain Intervention(s): Limited activity within patient's tolerance;Monitored during session;Repositioned    Home Living                      Prior Function            PT Goals (current goals can now be found in the care plan section) Acute Rehab PT Goals Patient Stated Goal: unable to state this date PT Goal Formulation: With patient Time For Goal Achievement: 03/14/20 Potential to Achieve Goals: Good Progress towards PT goals: Progressing toward goals (slowly)    Frequency    Min 4X/week      PT Plan Current plan remains appropriate    Co-evaluation              AM-PAC PT "6 Clicks" Mobility   Outcome Measure  Help needed turning from your back to your side while in a flat bed without using bedrails?: A Lot Help needed moving from lying on your back to sitting on the side of a flat bed without using bedrails?: A Lot Help needed moving to and from a bed to a chair (including a wheelchair)?: A Lot Help needed standing up from a chair using your arms (e.g., wheelchair or bedside chair)?: A Lot Help needed to walk in hospital room?: A Lot Help needed climbing 3-5 steps with a railing? : Total 6 Click Score: 11    End of Session Equipment Utilized During Treatment: Gait belt;Oxygen Activity Tolerance: Patient limited by lethargy Patient left: in chair;with call bell/phone within reach;with chair alarm set;with restraints reapplied   PT Visit Diagnosis: Hemiplegia and hemiparesis;Other symptoms and signs involving the  nervous system (R29.898);Other abnormalities of gait and mobility (R26.89);Unsteadiness on feet (R26.81);Muscle weakness (generalized) (M62.81);Difficulty in walking, not elsewhere classified (R26.2) Hemiplegia - Right/Left: Left Hemiplegia - dominant/non-dominant: Non-dominant Hemiplegia - caused by: Other Nontraumatic intracranial hemorrhage     Time: 1159-1229 PT Time Calculation (min) (ACUTE ONLY): 30  min  Charges:  $Therapeutic Activity: 8-22 mins $Neuromuscular Re-education: 8-22 mins                     Moishe Spice, PT, DPT Acute Rehabilitation Services  Pager: [phone] Office: [phone]    Orvan Falconer 03/08/2020, 12:41 PM

## 2020-03-09 DIAGNOSIS — S11029A Unspecified open wound of trachea, initial encounter: Secondary | ICD-10-CM

## 2020-03-09 DIAGNOSIS — S11019A Unspecified open wound of larynx, initial encounter: Secondary | ICD-10-CM

## 2020-03-09 LAB — GLUCOSE, CAPILLARY
Glucose-Capillary: 108 mg/dL — ABNORMAL HIGH (ref 70–99)
Glucose-Capillary: 120 mg/dL — ABNORMAL HIGH (ref 70–99)
Glucose-Capillary: 130 mg/dL — ABNORMAL HIGH (ref 70–99)
Glucose-Capillary: 134 mg/dL — ABNORMAL HIGH (ref 70–99)
Glucose-Capillary: 137 mg/dL — ABNORMAL HIGH (ref 70–99)
Glucose-Capillary: 144 mg/dL — ABNORMAL HIGH (ref 70–99)
Glucose-Capillary: 155 mg/dL — ABNORMAL HIGH (ref 70–99)

## 2020-03-09 NOTE — Progress Notes (Signed)
PROGRESS NOTE  Stacy Moran ZJQ:[id number] DOB: [birth year] DOA: 01/30/2020 PCP: No primary care provider on file.  Brief History   64 year old woman presented with severe headache, found to have large volume subarachnoid hemorrhage secondary to ruptured PICA with resultant hydrocephalus, intubated and admitted by neurosurgery to the ICU, EVD placed, underwent diagnostic cerebral angiogram, aneurysm not amenable to coiling, underwent left craniotomy for clipping of PICA aneurysm.  Failed extubation secondary to large goiter, seen by ENT, underwent tracheostomy and thyroidectomy.  Severe dysphagia persisted and PEG tube placed.  Continue to have issues with significant secretions, seen again by ENT, felt to have very poor sensation larynx and hypopharynx, very high risk for aspiration.  Major barrier to transfer to CIR is tracheostomy secretions.  A & P  Subarachnoid hemorrhage secondary to ruptured left PICA with associated hydrocephalus.  Status post EVD, cerebral angiogram, craniotomy for clipping of aneurysm. --Neurosurgery signed off --Complications include dysphagia, PEG tube placement; tracheostomy dependence; weakness --Continue supportive care  Tracheostomy dependent, failed extubation attempts secondary to prolonged critical illness, complicated by upper airway obstruction secondary to multinodular goiter, full-thickness tracheostomy wound --Continue trach wound care per wound care nurse. --Underwent direct laryngoscopy 12/3, per ENT continue cuffed tracheostomy tube to protect airway, over next several months continue evaluation to see how the subglottic airway progresses. --No fever or leukocytosis, tracheal secretion culture reflects colonization --Continue supportive care, ask ENT to reassess 12/13  Dysphagia, s/p PEG 11/26 --Continue PEG tube feeds  Essential hypertension --Remained stable, continue amlodipine, hydralazine, labetalol  Anemia of acute  illness --stable  Hypothyroidism, acquired/postsurgical, status post thyroidectomy this hospitalization --Continue Synthroid  Hyperglycemia secondary to tube feeds.  Hemoglobin A1c 6.2. --Remains stable, continue SSI  RESOLVED . Hypertensive emergency . Acute respiratory failure secondary to subarachnoid hemorrhage, complicated by thyroid mass compressing trachea . Goiter, status post thyroidectomy . Tracheobronchitis.  fiberoptic examination of 11/30. Marland Kitchen Acute on chronic respiratory failure secondary to mucous plugging with episode of bradycardia which responded to deep suctioning  Disposition Plan:  Discussion: Complex case, new trach dependent and PEG dependent.  Hopeful for CIR but trach wound drainage remains a barrier.  No clear infection.  Status is: Inpatient  Remains inpatient appropriate because:Inpatient level of care appropriate due to severity of illness   Dispo: The patient is from: Home              Anticipated d/c is to: CIR              Anticipated d/c date is: unknown              Patient currently is not medically stable to d/c.  DVT prophylaxis: heparin injection 5,000 Units Start: 02/05/20 0845 SCD's Start: 01/30/20 0647   Code Status: Full Code Family Communication:   Murray Hodgkins, MD  [facility] Direct contact: see [email or web address] (further directions at bottom of note if needed) 7PM-7AM contact night coverage as at bottom of note 03/09/2020, 4:08 PM  LOS: 39 days   Significant Hospital Events   11/03 Admitted 11/04 craniotomy with clipping of left PICA 11/12 total thyroidectomy, tracheostomy 11/13 start ABx for fever and tracheobronchitis 12/4 bradycardia secondary to copious secretions, transferred to ICU   Consults:  . Neurosurgery . Critical care . ENT . CIR   Procedures:  . ETT 11/3 >  11/9 . 11/3 > 11/16 right frontal ventriculostomy . 11/3 diagnostic cerebral angiogram . 11/12 tracheostomy, total thyroidectomy . 11/26  PEG . 12/3 direct laryngoscopy  Significant Diagnostic Tests:  .  Micro Data:  .    Antimicrobials:  .   Interval History/Subjective  CC: f/u subarachnoid hemorrhage  No new issues Not able to talk  Objective   Vitals:  Vitals:   03/09/20 1530 03/09/20 1533  BP: (!) 160/96 (!) 160/96  Pulse:  (!) 103  Resp:  19  Temp: 98.1 F (36.7 C)   SpO2:  94%    Exam: Constitutional:   . Appears calm and comfortable ENMT:  . grossly normal hearing  . Trach in place, thick drainage around wound Respiratory:  . CTA bilaterally, no w/r/r.  . Respiratory effort normal.  Cardiovascular:  . RRR, no m/r/g . No LE extremity edema   . Telemetry SR .  Psychiatric:  . Mental status o Mood, affect difficult to assess given nonverbal state  I have personally reviewed the following:   Today's Data  . CBG stable  Scheduled Meds: . amLODipine  10 mg Per Tube Daily  . chlorhexidine gluconate (MEDLINE KIT)  15 mL Mouth Rinse BID  . Chlorhexidine Gluconate Cloth  6 each Topical Daily  . docusate  100 mg Per Tube BID  . feeding supplement (PROSource TF)  45 mL Per Tube BID  . heparin  5,000 Units Subcutaneous Q8H  . hydrALAZINE  25 mg Per Tube Q8H  . insulin aspart  0-20 Units Subcutaneous Q4H  . labetalol  300 mg Per Tube TID  . levothyroxine  100 mcg Per Tube Q0600  . mouth rinse  15 mL Mouth Rinse 10 times per day  . pantoprazole sodium  40 mg Per Tube Daily  . polyethylene glycol  17 g Per Tube Daily  . polyvinyl alcohol  1 drop Left Eye BID  . QUEtiapine  50 mg Per Tube QHS  . sodium chloride flush  10-40 mL Intracatheter Q12H   Continuous Infusions: . feeding supplement (OSMOLITE 1.2 CAL) 1,000 mL (03/06/20 1037)  . 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  1. Check the care team in [facility] and look for a) attending/consulting [facility] provider listed and b) the [facility] team listed 2. Log into [email or web address] and use [place] universal password to access. If you do not have the password, please contact the hospital operator. 3. Locate the [facility] provider you are looking for under [facility] and page to a number that you can be directly reached. 4. If you still have difficulty reaching the provider, please page the [facility] (Director on Call) for the Hospitalists listed on amion for assistance.

## 2020-03-10 LAB — GLUCOSE, CAPILLARY
Glucose-Capillary: 105 mg/dL — ABNORMAL HIGH (ref 70–99)
Glucose-Capillary: 115 mg/dL — ABNORMAL HIGH (ref 70–99)
Glucose-Capillary: 118 mg/dL — ABNORMAL HIGH (ref 70–99)
Glucose-Capillary: 135 mg/dL — ABNORMAL HIGH (ref 70–99)
Glucose-Capillary: 143 mg/dL — ABNORMAL HIGH (ref 70–99)
Glucose-Capillary: 145 mg/dL — ABNORMAL HIGH (ref 70–99)
Glucose-Capillary: 95 mg/dL (ref 70–99)
Glucose-Capillary: 98 mg/dL (ref 70–99)

## 2020-03-10 MED ORDER — CEFAZOLIN SODIUM-DEXTROSE 1-4 GM/50ML-% IV SOLN
1.0000 g | Freq: Three times a day (TID) | INTRAVENOUS | Status: DC
  Administered 2020-03-10 – 2020-03-13 (×9): 1 g via INTRAVENOUS
  Filled 2020-03-10 (×12): qty 50

## 2020-03-10 NOTE — Consult Note (Signed)
Gentry Nurse wound follow up Patient receiving care in [facility] 3W06. Wound type: tracheostomy site full thickness wound Measurement: deferred Wound bed: not visualized Drainage (amount, consistency, odor) significantly large amounts of yellow tinged tracheal secretions impacting the wound to the tracheostomy insertion site. Periwound: protected with Aquacel, foam dressing, and Drawtex. Dressing procedure/placement/frequency: continue with current POC. Monitor the wound area(s) for worsening of condition such as: Signs/symptoms of infection,  Increase in size,  Development of or worsening of odor, Development of pain, or increased pain at the affected locations.  Notify the medical team if any of these develop. Val Riles, RN, MSN, CWOCN, CNS-BC, pager [phone]

## 2020-03-10 NOTE — Progress Notes (Signed)
SLP Cancellation Note  Patient Details Name: Stacy Moran MRN: [id number] DOB: [birth year]   Cancelled treatment:       Reason Eval/Treat Not Completed: Fatigue/lethargy limiting ability to participate   [name] 03/10/2020, 1:57 PM

## 2020-03-10 NOTE — Progress Notes (Signed)
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  11/4, pt s/p L far-lateral craniotomy for clipping of PICA aneurysm. 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 12/4 pt with desat and bradycardia and was transferred to ICU and placed on the vent. Pt off vent 12/6.   OT comments  Pt seen with PT services to maximize safety and functional outcomes of session, trialed grooming tasks at both bed level and seated EOB limited by arousal/lethargy and functional deficits of balance/strength and AROM. Observed with preference for posterior/R lean with difficulty sustained static without external supports. Limited righting observed, this date pt is total +2 for bed mobility and repositioning and fluctuating need for max-mod of 1- 2 for safety sitting at EOB.  Significant present of increased secretions throughout session. Nursing aware. OT will continue to follow, modifications made to recommendations d/t current presentation of pt and tolerance level. Will continue to monitor appropriateness with additional sessions.     Follow Up Recommendations  SNF;Supervision/Assistance - 24 hour    Equipment Recommendations  Wheelchair (measurements OT);Wheelchair cushion (measurements OT);Hospital bed    Recommendations for Other Services Rehab consult    Precautions / Restrictions Precautions Precautions: Fall Precaution Comments: trach, wrist restraints, bil mittens, PEG Restrictions Weight Bearing Restrictions: No       Mobility Bed Mobility Overal bed mobility: Needs Assistance Bed Mobility: Supine to Sit;Sit to Supine     Supine to sit:  +2 for physical assistance;HOB elevated;Total assist Sit to supine: +2 for physical assistance;HOB elevated;Total assist   General bed mobility comments: pt with no observed initiation to task completion throughout session; brief eye opening at EOB, but consistently requiring total +2 for repositioning and transition at bed level and EOB.  Transfers                 General transfer comment: deferred out of bed transfer this date due to pt unwillng/able to open eyes with extreme lethargy during session    Balance Overall balance assessment: Needs assistance Sitting-balance support: Bilateral upper extremity supported;Feet supported Sitting balance-Leahy Scale: Zero Sitting balance - Comments: initial response with transition to EOB to L push, with weight bearing and shifting at hips and trunk with external supports pt able to maintain sustained static sitting with slight R posterior lean intermittent righting observed but grossly requiring heavy max A for sustained sitting and correction of any LOB. Postural control: Right lateral lean;Posterior lean                                 ADL either performed or assessed with clinical judgement   ADL       Grooming: Total assistance;Sitting Grooming Details (indicate cue type and reason): toatl A to wash face and participate with oral hygiene. excessive secretions during session;                 Toilet Transfer: Total assistance;+2 for physical assistance                   Vision       Perception     Praxis  Cognition Arousal/Alertness: Lethargic Behavior During Therapy: Flat affect Overall Cognitive Status: Impaired/Different from baseline Area of Impairment: Attention;Following commands                 Orientation Level: Disoriented to;Time;Situation;Place Current Attention Level: Focused Memory: Decreased recall of precautions;Decreased short-term memory Following Commands: Follows  one step commands inconsistently Safety/Judgement: Decreased awareness of safety;Decreased awareness of deficits Awareness: Intellectual Problem Solving: Slow processing;Decreased initiation;Difficulty sequencing;Requires verbal cues;Requires tactile cues General Comments: pt very lethargic, minimal eye opening, no command follow. pt with a lot of secretions, RN aware        Exercises     Shoulder Instructions       General Comments pt on trach collar with significant amount of secretions    Pertinent Vitals/ Pain       Pain Assessment: Faces Faces Pain Scale: Hurts a little bit Pain Location: intermittent grimacing noted with positioning and movement Pain Descriptors / Indicators: Discomfort;Grimacing;Guarding Pain Intervention(s): Limited activity within patient's tolerance  Home Living                                          Prior Functioning/Environment              Frequency  Min 2X/week        Progress Toward Goals  OT Goals(current goals can now be found in the care plan section)  Progress towards OT goals: Progressing toward goals  Acute Rehab OT Goals Patient Stated Goal: unable to state this date  Plan Discharge plan needs to be updated    Co-evaluation      Reason for Co-Treatment: Complexity of the patient's impairments (multi-system involvement);For patient/therapist safety PT goals addressed during session: Strengthening/ROM;Balance        AM-PAC OT "6 Clicks" Daily Activity     Outcome Measure   Help from another person eating meals?: Total Help from another person taking care of personal grooming?: Total Help from another person toileting, which includes using toliet, bedpan, or urinal?: Total Help from another person bathing (including washing, rinsing, drying)?: Total Help from another person to put on and taking off regular upper body clothing?: Total Help from another person to put on and taking off regular lower  body clothing?: Total 6 Click Score: 6    End of Session Equipment Utilized During Treatment: Oxygen  OT Visit Diagnosis: Unsteadiness on feet (R26.81);Muscle weakness (generalized) (M62.81);Other abnormalities of gait and mobility (R26.89) Hemiplegia - Right/Left: Right Hemiplegia - dominant/non-dominant: Dominant Hemiplegia - caused by: Nontraumatic intracerebral hemorrhage   Activity Tolerance Patient limited by lethargy   Patient Left with bed alarm set;in bed;with restraints reapplied;with call bell/phone within reach   Nurse Communication          Time: 8264-1583 OT Time Calculation (min): 36 min  Charges: OT General Charges $OT Visit: 1 Visit OT Treatments $Self Care/Home Management : 8-22 mins  Kirk Basquez OTR/L acute rehab services Office: [phone]   Toula Moos Axelle Szwed 03/10/2020, 1:10 PM

## 2020-03-10 NOTE — Progress Notes (Signed)
Nutrition Follow-up  DOCUMENTATION CODES:   Not applicable  INTERVENTION:  Continue tube feeds via PEG: - Osmolite 1.2 @ 60 ml/hr (1440 ml/day) - ProSource TF 45 ml BID  Tube feeding regimen provides 1808 kcal, 102 grams of protein, and 1181 ml of H2O.   NUTRITION DIAGNOSIS:   Inadequate oral intake related to acute illness as evidenced by estimated needs.  ongoing  GOAL:   Patient will meet greater than or equal to 90% of their needs  Met with TF  MONITOR:   Vent status,Labs,Weight trends,Skin,I & O's  REASON FOR ASSESSMENT:   Ventilator    ASSESSMENT:   64 year old female who presented to the ED on 11/03 with headache. 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 Pt required intubation for airway protection.  11/03 - admitted 11/04 [facility], L PICA aneurysm to OR for craniotomy, clipping  11/09 - extubated but immediately re-intubated 11/12 - s/p thyroidectomy with tracheostomy; cortrak placed (tip gastric) 11/16 - EVD removed 11/17 - trach exchange to facilitate PMV 11/20 - Cortrak removed by pt 11/22 - trach change (pt self-decannulated) 11/26 - PEG placed 12/01 - MBS with recommendation for NPO 12/03 - s/p direct laryngoscopy 12/04 - transferred to ICU, put on mechanical ventilation 12/06 - tolerating PS 12/07 - tolerating TCT; transferred to 3W  Pt not following commands today.   Pt remains on trach and receiving TF via PEG. Per MD, pt continues to have thick secretions from trach both with tracheal suction and oozing around trach site; sputum culture with essentially pansensitive staph only resistant to TCN. RT and WOC following.   Current TF: Osmolite 1.2 @ 60 ml/hr, ProSource TF 45 ml BID Pt tolerating TF well per RN. Will continue with current TF at this time.  Admit weight: 66.9 kg Current Weight: 63.6 kg  UOP: 61m x24 hours  Labs: CBGs [phone]: colace, ss novolog Q4H,  miralax IVF: LR @ 163mhr  Diet Order:   Diet Order    None      EDUCATION NEEDS:   No education needs have been identified at this time  Skin:  Skin Assessment: Skin Integrity Issues: Skin Integrity Issues:: Stage II,Other (Comment),Incisions Stage II: throat Incisions: neck, head Other: skin tear back  Last BM:  02/28/20  Height:   Ht Readings from Last 1 Encounters:  03/04/20 5' 9" (1.753 m)    Weight:   Wt Readings from Last 1 Encounters:  03/10/20 63.6 kg    Ideal Body Weight:  65.9 kg  BMI:  Body mass index is 20.71 kg/m.  Estimated Nutritional Needs:   Kcal:  1700-1900  Protein:  95-110 grams  Fluid:  > 1.7 L/day    AmLarkin InaMS, RD, LDN RD pager number and weekend/on-call pager number located in [facility]

## 2020-03-10 NOTE — Progress Notes (Signed)
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  11/4, pt s/p L far-lateral craniotomy for clipping of PICA aneurysm. 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 12/4 pt with desat and bradycardia and was transferred to ICU and placed on the vent. Pt off vent 12/6.    PT Comments    Pt very lethargic today with no active participation in therapy. Pt seen with OT to optimize mobilization. Provided manual stimulation to trunk and neck to promote upright posture. Pt with minimal eye opening and no command follow this date. Pt with regression since 12/9 treatment as pt was impulsive but able to stand and transfer to chair with minimal assist. At this time recommending SNF upon d/c to allow for increased time to progress to optimal functional recovery. Acute PT to cont to follow.   Follow Up Recommendations  SNF;Supervision/Assistance - 24 hour     Equipment Recommendations  Wheelchair (measurements PT);Wheelchair cushion (measurements PT);3in1 (PT)    Recommendations for Other Services       Precautions / Restrictions Precautions Precautions: Fall Precaution Comments: trach, wrist restraints, bil mittens Restrictions Weight Bearing Restrictions: No    Mobility  Bed Mobility Overal bed mobility: Needs Assistance Bed Mobility: Supine to Sit;Sit to Supine     Supine to sit: +2 for physical assistance;HOB elevated;Total assist Sit to supine: +2 for physical assistance;HOB elevated;Total assist   General bed mobility comments: pt with no initiation of task asked, total assist for trunk and LE management, pt attempted to open eyes  once in sitting but ultimately kept eyes closed  Transfers                 General transfer comment: pt lethargic and falling to the R, not safe to stand or transfer to chair this date  Ambulation/Gait                 Stairs             Wheelchair Mobility    Modified Rankin (Stroke Patients Only) Modified Rankin (Stroke Patients Only) Pre-Morbid Rankin Score: No symptoms Modified Rankin: Severe disability     Balance Overall balance assessment: Needs assistance Sitting-balance support: Bilateral upper extremity supported;Feet supported Sitting balance-Leahy Scale: Zero Sitting balance - Comments: pt initially R posterior lean progressing to R side bending. Pt responsive to tactile cues to spine to promote trunk extension however pt unable to maintain. worked on cervical rotation at [employer], initially pt permitted all directions however then became resistant to L cervical rotation Postural control: Right lateral lean                                  Cognition Arousal/Alertness: Lethargic Behavior During Therapy: Flat affect Overall Cognitive Status: Impaired/Different from baseline Area of Impairment: Attention;Following commands                   Current Attention Level: Focused   Following Commands: Follows one step commands inconsistently       General Comments: pt very lethargic, minimal eye opening, no command follow. pt with a lot of secretions, RN aware  Exercises      General Comments General comments (skin integrity, edema, etc.): pt on trach collar with significant amount of secretions      Pertinent Vitals/Pain Pain Assessment: Faces Faces Pain Scale: Hurts a little bit Pain Location: generalized grimacing with movement Pain Descriptors / Indicators: Discomfort;Grimacing;Guarding Pain Intervention(s): Limited activity within patient's tolerance    Home Living                      Prior Function             PT Goals (current goals can now be found in the care plan section) Progress towards PT goals: Not progressing toward goals - comment    Frequency    Min 2X/week      PT Plan Frequency needs to be updated;Discharge plan needs to be updated    Co-evaluation PT/OT/SLP Co-Evaluation/Treatment: Yes Reason for Co-Treatment: Complexity of the patient's impairments (multi-system involvement) PT goals addressed during session: Mobility/safety with mobility        AM-PAC PT "6 Clicks" Mobility   Outcome Measure  Help needed turning from your back to your side while in a flat bed without using bedrails?: Total Help needed moving from lying on your back to sitting on the side of a flat bed without using bedrails?: Total Help needed moving to and from a bed to a chair (including a wheelchair)?: Total Help needed standing up from a chair using your arms (e.g., wheelchair or bedside chair)?: Total Help needed to walk in hospital room?: Total Help needed climbing 3-5 steps with a railing? : Total 6 Click Score: 6    End of Session Equipment Utilized During Treatment: Gait belt;Oxygen Activity Tolerance: Patient limited by lethargy Patient left: in bed;with call bell/phone within reach;with bed alarm set;with restraints reapplied Nurse Communication: Mobility status PT Visit Diagnosis: Hemiplegia and hemiparesis;Other symptoms and signs involving the nervous system (R29.898);Other abnormalities of gait and mobility (R26.89);Unsteadiness on feet (R26.81);Muscle weakness (generalized) (M62.81);Difficulty in walking, not elsewhere classified (R26.2) Hemiplegia - Right/Left: Left Hemiplegia - dominant/non-dominant: Non-dominant Hemiplegia - caused by: Other Nontraumatic intracranial hemorrhage     Time: 3403-5248 PT Time Calculation (min) (ACUTE ONLY): 24 min  Charges:  $Neuromuscular Re-education: 8-22 mins                     Kittie Plater, PT, DPT Acute Rehabilitation  Services Pager #: [phone] Office #: [phone]    Berline Lopes 03/10/2020, 12:48 PM

## 2020-03-10 NOTE — Progress Notes (Signed)
NAME:  Stacy Moran, MRN:  [id number], DOB:  1956-01-04, LOS: 20 ADMISSION DATE:  01/30/2020, CONSULTATION DATE:  01/30/20 REFERRING MD:  Ralene Ok, CHIEF COMPLAINT:  Headache   Brief History   64 y/o presented with large subarachnoid hemorrhage due to ruptured PICA with resultant hydrocephalus.  Required tracheostomy with prolonged hospital course.    Past Medical History  Transferred to ICU from general floor overnight due to bradycardia. Improved with trachel suctioning of copious secretions. Bronchoscopy showing copious secretions but other etiology for blockage  Significant Hospital Events   11/03 Admitted 11/04 craniotomy with clipping of left PICA 11/12 total thyroidectomy, tracheostomy 11/13 start ABx for fever and tracheobronchitis 11/16 Now 11 days post clipping.  Drain stopped yesterday so left clamped.  Was interactive with PT yesterday. This morning, agitated and complaining of back pain. More somnolent following fentanyl this morning. Stable from neuro check  11/21 pulm called back again;  trach bleeding. we called ENT 11/23 subglottic obstruction which is new observed on MBS-->CT neck ordered to r/o infection. Also abx started. As she failed MBS IR consulted for PEG. Should be last day for nimodipine. We asked IM to assist w. 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 11/27 CT head > Resolution of subarachnoid hemorrhage, stable subdural hematoma 11/29 CT head > Unchanged size of right subdural hematoma with  minimal leftward midline shift  Micro Data:  11/03 SARS2/ Flu > neg 11/08 respiratory culture >> negative 11/08 blood culture >>negative 11/11 urine culture >> multiple species 11/12 MRSA PCR >> negative 11/12 sputum: staph aureus, Citrobacter and Haemophilus parainfluenza (B lactamase +)  11/22 sputum >> multiple organism, no staph or strep 12/4 Sputum>> negative and gram-positive rods  Antimicrobials:  Zosyn 11/13 >> 11/14 Ceftriaxone 11/14 - 11/20  Interim history/subjective:  Marked secretion burden. Not following commands for me.  Objective   Blood pressure (!) 156/90, pulse 82, temperature 97.7 F (36.5 C), temperature source Axillary, resp. rate 19, height 5\' 9"  (1.753 m), weight 63.6 kg, SpO2 99 %.    FiO2 (%):  [21 %] 21 %   Intake/Output Summary (Last 24 hours) at 03/10/2020 1409 Last data filed at 03/10/2020 0500 Gross per 24 hour  Intake --  Output 600 ml  Net -600 ml   Filed Weights   03/05/20 0326 03/06/20 0500 03/10/20 0500  Weight: 54.3 kg 59.7 kg 63.6 kg    Examination: Constitutional: ill appearing woman in NAD  Eyes: eyes deviated to left, pupils reactive Ears, nose, mouth, and throat: Trach in place with copious mucopurulent secretions Cardiovascular: RRR, ext warm Respiratory: Scattered rhonci, no respiratory distres Gastrointestinal: Soft, +BS, PEG in place Skin: No rashes, normal turgor Neurologic: moves all 4 ext but not to command Psychiatric: cannot assess     Resolved Hospital Problem list    Tracheobronchitis (Resolved) Beta lactamase + H flu, Citrobacter and S.aureus are susceptible.: Patient completed 7 days treatment with ceftriaxone Trach site bleeding  11/22 consult for bleeding around trach, TXA and epi nebs ordered per prelim recs   Assessment & Plan:  Acute on chronic respiratory failure due to inability to handle secretions after ruptured PICA aneurysm. Goiter s/p thyroidectomy and tracheostomy MSSA  tracheobronchitis - Would do 7 days cefazolin for MSSA - Usual suctioning and pulmonary toileting as ordered - Not a candidate for decannulation for foreseaable future - Will follow weekly for trach management, reach out if any issues in interim  Erskine Emery MD PCCM [facility] 03/10/2020 2:09 PM

## 2020-03-10 NOTE — Progress Notes (Signed)
ENT follow-up  Respiratory therapy is suctioning patient upon my arrival.  Cuff is down on the tracheostomy tube.  She has had variable amounts of secretions.  Patient is really not responding at all so I am not able to assess her breathing or speech.  Tracheostomy is in good position.

## 2020-03-10 NOTE — Progress Notes (Addendum)
[facility] PROGRESS NOTE  Stacy Moran HER:[id number] DOB: [birth year] DOA: 01/30/2020 PCP: No primary care provider on file.       Status: Remains inpatient appropriate because:Altered mental status, Unsafe d/c plan, IV treatments appropriate due to intensity of illness or inability to take PO and Inpatient level of care appropriate due to severity of illness   Dispo: The patient is from: Home              Anticipated d/c is to: SNF              Anticipated d/c date is: > 3 days              Patient currently is not medically stable to d/c.  Barriers to discharge include altered mentation, anticipation trach downsizing/capping and potential for decannulation will be a long process; requirement for PEG tube feedings.  Therapies recommending either CIR or SNF pending funding.  Patient has no insurance and has been deemed Medicaid potential  Code Status: Full Family Communication: 12/9 daughter Stacy Moran by telephone DVT prophylaxis: Subcutaneous heparin Vaccination status: No documentation patient has received Covid vaccine  Foley catheter: No;  pure wick device   HPI: Patient admitted on 11/3 other neurosurgical service after presenting with severe headache.  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  She was hypertensive in the ER and was initially awake and following commands but became lethargic after administration of fentanyl with some improvement after Narcan.  Patient was intubated and transferred to the neuro ICU for further care.  Patient has no significant past medical history.  During the hospitalization patient underwent a craniotomy on 11/4.  Was unable to be safely extubated and underwent tracheostomy placement on 11/12.  Hospital course complicated by Citrobacter and Haemophilus influenza tracheobronchitis.  Patient was also noted with a large thyroid mass and  underwent total thyroidectomy as well on 11/12.  She developed bleeding around the trach site on 11/21 and ENT was consulted.  2 days later on 11/23 a new subglottic obstruction was observed on modified barium swallow and CT did not reveal any signs of obstruction or abscess.  Continues to fail swallowing evaluation so PEG was placed.  She briefly required mechanical ventilation on 12/4 and has subsequently been weaned to a trach collar and was moved out of the ICU to PCU 3 W. on 12/7.  In addition to the above patient has also had issues regarding desaturations related to mucous plugging.  On 12/7 palliative care team met with patient and spoke with daughter during evaluation by telephone.  Currently remains a full code with daughter Stacy Moran is surrogate decision-maker with patient being delegated primary decision maker.  Significant events 11/03 Admitted 11/04 craniotomy with clipping of left PICA 11/12 total thyroidectomy, tracheostomy 11/13 start ABx for fever and tracheobronchitis 11/16 Now 11 days post clipping.  Drain stopped yesterday so left clamped.  Was interactive with PT yesterday. This morning, agitated and complaining of back pain. More somnolent following fentanyl this morning. Stable from neuro check  11/21 pulm called back again;  trach bleeding. we called ENT 11/23 subglottic obstruction which is new observed on MBS-->CT neck ordered to r/o infection. Also abx started. As she failed MBS IR consulted for PEG. Should be last day for nimodipine. We asked IM to assist w. Care  12/4:  Put on mechanical ventilation and transferred to ICU 12/6 tolerating PS  Subjective: Awakened briefly and smiled.  Not following commands today.  Objective: Vitals:   03/10/20 0750 03/10/20 0823  BP: (!) 146/90   Pulse: 72 73  Resp: 20 19  Temp: 98.6 F (37 C)   SpO2: 97% 95%    Intake/Output Summary (Last 24 hours) at 03/10/2020 1115 Last data filed at 03/10/2020 0500 Gross per 24 hour   Intake --  Output 600 ml  Net -600 ml   Filed Weights   03/05/20 0326 03/06/20 0500 03/10/20 0500  Weight: 54.3 kg 59.7 kg 63.6 kg    Exam:  Constitutional: NAD, calm, appears to be comfortable Neck: Tracheostomy tube in place, he needs to have yellow-green secretions at insertion site Respiratory: Coarse to auscultation bilaterally, no wheezing, no crackles. Normal respiratory effort.  RT at bedside and reported similar secretions suctioned per trach- no accessory muscle use.  21% FiO2 per trach collar Cardiovascular: Regular rate and rhythm, No extremity edema.  Extremities with appropriate capillary refill Abdomen: no tenderness, no masses palpated. . Bowel sounds positive.  PEG tube for tube feedings at 60 cc/h Musculoskeletal: No joint deformity upper and lower extremities. no contractures.  Appears to have normal muscle tone but difficult to assess secondary to patient's lethargy.  Skin: no rashes, lesions, ulcers. No induration Neurologic: CN 2-12 grossly intact although does have mild disconjugate gaze and appearance of cataract on left eye. Sensation grossly intact, unable to adequately assess strength given patient lethargy  Psychiatric: Awakens to name.  Trach in place and unable to phonate therefore psychiatric exam limited. 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 -Has nimodipine for cerebral vasospasm prophylaxis -Highly deconditioned; not appear to have any focal neurological deficits -Continue PT and OT-current recommendation is for CIR although depending on participation may be better suited for SNF tenuous to require max assist for activities during therapy sessions -SLP consulted for evaluation re: language and cognition  Acute on chronic respiratory failure (multifactorial) 1) tracheostomy dependent 2) recurrent mucous  plugging and history of polymicrobial tracheobronchitis this admission 3) new pansensitive staph tracheobronchitis (12/13) -Continues to have thick yellow-green tracheal secretions both with tracheal suction oozing around trach site. -Sputum culture with essentially pansensitive staph only resistant to TCN.  Pulmonary/trach team also following.  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 -Mass caused tracheal compression -Continue Synthroid  Metabolic encephalopathy -Multifactorial secondary to acute critical illness as well as recent neurological bleed -Patient having issues with agitation and confusion; continue Seroquel at bedtime -has IV Haldol as needed for agitation -Continue to push mobility with PT and OT and encourage socialization -Continue wrist restraints to prevent patient from accidentally pulling out tracheostomy tube or PEG  Dysphagia/mild to moderate protein calorie malnutrition -Continue tube feedings per PEG -Continue Prosource tube feeding liquid Nutrition Problem: Inadequate oral intake Etiology: acute illness Signs/Symptoms: estimated needs Interventions: Prostat,Tube feeding Estimated body mass index is 20.71 kg/m as calculated from the following:   Height as of this encounter: $RemoveBeforeD'5\' 9"'[id number]$  (1.753 m).   Weight as of this encounter: 63.6 kg.     Incision (Closed)  01/31/20 Head (Active)  Date First Assessed/Time First Assessed: 01/31/20 0138   Location: Head    Assessments 01/31/2020  1:00 AM 03/09/2020  8:35 PM   Dressing Type -- None  Margins Attached edges (approximated) Attached edges (approximated)  Closure Staples Staples  Treatment -- Other (Comment)     No Linked orders to display     Incision (Closed) 02/08/20 Neck Other (Comment) (Active)  Date First Assessed/Time First Assessed: 02/08/20 1335   Location: Neck  Location Orientation: Other (Comment)    Assessments 02/10/2020 12:00 AM 03/09/2020  8:35 PM  Dressing Type Gauze (Comment) Gauze (Comment)  Dressing Changed Clean;Dry  Dressing Change Frequency PRN PRN  Site / Wound Assessment Clean --  Closure None --  Drainage Amount Minimal --  Drainage Description Serosanguineous --  Treatment Cleansed --     No Linked orders to display     Wound / Incision (Open or Dehisced) 02/08/20 Skin tear Back Left pink (Active)  Date First Assessed/Time First Assessed: 02/08/20 2000   Wound Type: Skin tear  Location: Back  Location Orientation: Left  Wound Description (Comments): pink    Assessments 02/08/2020  8:00 PM 03/09/2020  8:35 PM  Dressing Type Foam - Lift dressing to assess site every shift Foam - Lift dressing to assess site every shift  Dressing Status Clean;Dry;Intact --  Site / Wound Assessment Pink --  Peri-wound Assessment Intact --  Drainage Amount None None  Treatment Cleansed Other (Comment)     No Linked orders to display     Pressure Injury 02/12/20 Throat Mid Stage 2 -  Partial thickness loss of dermis presenting as a shallow open injury with a red, pink wound bed without slough. (Active)  Date First Assessed/Time First Assessed: 02/12/20 1500   Location: Throat  Location Orientation: Mid  Staging: Stage 2 -  Partial thickness loss of dermis presenting as a shallow open injury with a red, pink wound bed without slough.  Present on Admis...    Assessments 02/12/2020  8:00 AM 03/10/2020  8:00 AM  Dressing Type Foam - Lift dressing to assess site every shift Foam - Lift dressing to assess site every shift  Dressing --  Dry;Intact;Clean  Dressing Change Frequency PRN PRN  State of Healing -- Non-healing  Site / Wound Assessment Red;Painful;Other (Comment) Red  Peri-wound Assessment Intact;Erythema (blanchable) --  Margins Unattached edges (unapproximated) Unattached edges (unapproximated)  Drainage Amount Minimal Moderate  Drainage Description Serosanguineous Serosanguineous  Treatment Cleansed --     No Linked orders to display     Hypertension -New diagnosis this admission -Continue Norvasc, labetalol and hydralazine    Other problems: Diabetes mellitus 2 with mild hyperglycemia -New diagnosis this admission-globin A1c 6.2 -Continue Lantus and sliding scale insulin-of note hemoglobin A1c only mildly elevated therefore if able to transition to orally ingested diet recommend DC insulin in favor of oral hypoglycemic agents  Anemia of chronic illness -Current hemoglobin 8.8 -She did have issues with trach site bleeding that may have also contributed to anemia    Data Reviewed: Basic Metabolic Panel: Recent Labs  Lab 03/04/20 0138 03/08/20 0021  NA 138 136  K 4.2 4.0  CL 98 99  CO2 30 26  GLUCOSE 148* 129*  BUN 11 16  CREATININE 0.56 0.48  CALCIUM 8.9 9.3   Liver Function Tests: No results for input(s): AST, ALT, ALKPHOS, BILITOT, PROT, ALBUMIN in the last 168 hours. No results for input(s): LIPASE, AMYLASE in the last 168 hours. No results for  input(s): AMMONIA in the last 168 hours. CBC: Recent Labs  Lab 03/04/20 0138 03/08/20 0021  WBC 4.9 5.9  HGB 8.8* 9.5*  HCT 29.5* 30.9*  MCV 86.5 86.1  PLT 157 149*   Cardiac Enzymes: No results for input(s): CKTOTAL, CKMB, CKMBINDEX, TROPONINI in the last 168 hours. BNP (last 3 results) No results for input(s): BNP in the last 8760 hours.  ProBNP (last 3 results) No results for input(s): PROBNP in the last 8760 hours.  CBG: Recent Labs  Lab 03/09/20 2013 03/09/20 2125 03/10/20 0053 03/10/20 0353 03/10/20 0749  GLUCAP  135* 144* 95 145* 115*    Recent Results (from the past 240 hour(s))  MRSA PCR Screening     Status: None   Collection Time: 03/01/20  4:12 AM   Specimen: Nasal Mucosa; Nasopharyngeal  Result Value Ref Range Status   MRSA by PCR NEGATIVE NEGATIVE Final    Comment:        The GeneXpert MRSA Assay (FDA approved for NASAL specimens only), is one component of a comprehensive MRSA colonization surveillance program. It is not intended to diagnose MRSA infection nor to guide or monitor treatment for MRSA infections. Performed at [facility], [place] [address], [place], [place] [address]   Aerobic/Anaerobic Culture (surgical/deep wound)     Status: None   Collection Time: 03/01/20  6:01 AM   Specimen: Trachea; Wound  Result Value Ref Range Status   Specimen Description TRACHEAL SITE  Final   Special Requests NONE  Final   Gram Stain   Final    FEW WBC PRESENT, PREDOMINANTLY PMN ABUNDANT GRAM NEGATIVE RODS MODERATE GRAM POSITIVE COCCI FEW GRAM VARIABLE ROD Performed at [facility], [place] [address] [place], [place] [address]    Culture   Final    MODERATE STREPTOCOCCUS GROUP F Beta hemolytic streptococci are predictably susceptible to penicillin and other beta lactams. Susceptibility testing not routinely performed. WITHIN MIXED ORGNISMS MIXED ANAEROBIC FLORA PRESENT.  CALL LAB IF FURTHER IID REQUIRED.    Report Status 03/06/2020 FINAL  Final  Culture, respiratory (non-expectorated)     Status: None   Collection Time: 03/05/20 12:01 PM   Specimen: Tracheal Aspirate; Respiratory  Result Value Ref Range Status   Specimen Description TRACHEAL ASPIRATE  Final   Special Requests NONE  Final   Gram Stain   Final    RARE WBC PRESENT,BOTH PMN AND MONONUCLEAR MODERATE GRAM NEGATIVE RODS ABUNDANT GRAM POSITIVE COCCI Performed at [facility], [place] [address] [place], [place] [address]    Culture ABUNDANT STAPHYLOCOCCUS AUREUS  Final   Report Status 03/08/2020 FINAL   Final   Organism ID, Bacteria STAPHYLOCOCCUS AUREUS  Final      Susceptibility   Staphylococcus aureus - MIC*    CIPROFLOXACIN <=0.5 SENSITIVE Sensitive     ERYTHROMYCIN <=0.25 SENSITIVE Sensitive     GENTAMICIN <=0.5 SENSITIVE Sensitive     OXACILLIN 0.5 SENSITIVE Sensitive     TETRACYCLINE >=16 RESISTANT Resistant     VANCOMYCIN 1 SENSITIVE Sensitive     TRIMETH/SULFA <=10 SENSITIVE Sensitive     CLINDAMYCIN <=0.25 SENSITIVE Sensitive     RIFAMPIN <=0.5 SENSITIVE Sensitive     Inducible Clindamycin NEGATIVE Sensitive     * ABUNDANT STAPHYLOCOCCUS AUREUS     Studies: No results found.  Scheduled Meds: . amLODipine  10 mg Per Tube Daily  . chlorhexidine gluconate (MEDLINE KIT)  15 mL Mouth Rinse BID  . Chlorhexidine Gluconate Cloth  6  each Topical Daily  . docusate  100 mg Per Tube BID  . feeding supplement (PROSource TF)  45 mL Per Tube BID  . heparin  5,000 Units Subcutaneous Q8H  . hydrALAZINE  25 mg Per Tube Q8H  . insulin aspart  0-20 Units Subcutaneous Q4H  . labetalol  300 mg Per Tube TID  . levothyroxine  100 mcg Per Tube Q0600  . mouth rinse  15 mL Mouth Rinse 10 times per day  . pantoprazole sodium  40 mg Per Tube Daily  . polyethylene glycol  17 g Per Tube Daily  . polyvinyl alcohol  1 drop Left Eye BID  . QUEtiapine  50 mg Per Tube QHS  . sodium chloride flush  10-40 mL Intracatheter Q12H   Continuous Infusions: . feeding supplement (OSMOLITE 1.2 CAL) 1,000 mL (03/06/20 1037)  . 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   H/O total thyroidectomy   Tracheostomy status ([place])   Acute respiratory failure ([place])   Ventilator dependence ([place])   PEG (percutaneous endoscopic gastrostomy) status ([place])    Hyperglycemia   Consultants:  Neurosurgery  PCCM  ENT  Daily take meds  Procedures: 11/03 ETT >>11/09 11/03 EVD >> 11/09 ETT >> 11/12 11/12 Tracheostomy >>  Antibiotics: Anti-infectives (From admission, onward)   Start     Dose/Rate Route Frequency Ordered Stop   02/22/20 1845  ceFAZolin (ANCEF) IVPB 2g/100 mL premix  Status:  Discontinued        2 g 200 mL/hr over 30 Minutes Intravenous  Once 02/22/20 1758 03/01/20 0817   02/22/20 1522  ceFAZolin (ANCEF) IVPB 2g/100 mL premix        over 30 Minutes  Continuous PRN 02/22/20 1523 02/22/20 1510   02/22/20 1514  ceFAZolin (ANCEF) 2-4 GM/100ML-% IVPB       Note to Pharmacy: Luis Abed   : cabinet override      02/22/20 1514 02/23/20 0329   02/19/20 1500  ceFEPIme (MAXIPIME) 2 g in sodium chloride 0.9 % 100 mL IVPB        2 g 200 mL/hr over 30 Minutes Intravenous Every 12 hours 02/19/20 1408 02/26/20 0836   02/10/20 1445  cefTRIAXone (ROCEPHIN) 2 g in sodium chloride 0.9 % 100 mL IVPB        2 g 200 mL/hr over 30 Minutes Intravenous Every 24 hours 02/10/20 1348 02/15/20 1624   02/09/20 1500  piperacillin-tazobactam (ZOSYN) IVPB 3.375 g  Status:  Discontinued        3.375 g 12.5 mL/hr over 240 Minutes Intravenous Every 8 hours 02/09/20 0755 02/10/20 1348   02/09/20 0845  piperacillin-tazobactam (ZOSYN) IVPB 3.375 g        3.375 g 100 mL/hr over 30 Minutes Intravenous  Once 02/09/20 0755 02/09/20 1027       Time spent: 20 minutes    Erin Hearing ANP  [facility] Pager [phone].

## 2020-03-11 ENCOUNTER — Inpatient Hospital Stay (HOSPITAL_COMMUNITY): Payer: Self-pay

## 2020-03-11 DIAGNOSIS — R569 Unspecified convulsions: Secondary | ICD-10-CM

## 2020-03-11 DIAGNOSIS — G9341 Metabolic encephalopathy: Secondary | ICD-10-CM

## 2020-03-11 LAB — GLUCOSE, CAPILLARY
Glucose-Capillary: 157 mg/dL — ABNORMAL HIGH (ref 70–99)
Glucose-Capillary: 144 mg/dL — ABNORMAL HIGH (ref 70–99)
Glucose-Capillary: 81 mg/dL (ref 70–99)
Glucose-Capillary: 166 mg/dL — ABNORMAL HIGH (ref 70–99)
Glucose-Capillary: 138 mg/dL — ABNORMAL HIGH (ref 70–99)
Glucose-Capillary: 135 mg/dL — ABNORMAL HIGH (ref 70–99)

## 2020-03-11 MED ORDER — LORAZEPAM 2 MG/ML IJ SOLN: 1.0000 mg | INTRAMUSCULAR | Status: AC

## 2020-03-11 MED ORDER — QUETIAPINE FUMARATE 25 MG PO TABS
25.0000 mg | ORAL_TABLET | Freq: Every day | ORAL | Status: DC
  Administered 2020-03-11 – 2020-03-12 (×2): 25 mg
  Filled 2020-03-11 (×2): qty 1

## 2020-03-11 MED ORDER — LORAZEPAM 2 MG/ML IJ SOLN
INTRAMUSCULAR | Status: AC
  Administered 2020-03-11: 1 mg via INTRAVENOUS
  Filled 2020-03-11: qty 1

## 2020-03-11 NOTE — Progress Notes (Signed)
EEG complete - results pending 

## 2020-03-11 NOTE — Progress Notes (Signed)
RN noted patient's trach secretions have become pink tinged. MD notified. Patein does not appear to be in any distress, O2 saturation still good.

## 2020-03-11 NOTE — Progress Notes (Addendum)
[facility] PROGRESS NOTE  Stacy Moran VQX:[id number] DOB: 1956/01/06 DOA: 01/30/2020 PCP: No primary care provider on file.  Status: Remains inpatient appropriate because:Altered mental status, Unsafe d/c plan and IV treatments appropriate due to intensity of illness or inability to take PO   Dispo: The patient is from: Home              Anticipated d/c is to: SNF              Anticipated d/c date is: > 3 days              Patient currently is not medically stable to d/c.  Barriers to discharge: Currently requiring IV antibiotics for recurrent tracheobronchitis-once these are completed will be medically stable for discharge.  Needs SNF that is trach capable-care application pending    Code Status: Full Family Communication: Daughter 12/9 DVT prophylaxis: SQ heparin Vaccination status: Unknown  Foley catheter: No   HPI: Admitted 11/3 to neurosurgical service after presenting with severe headache later found to be related to subarachnoid hemorrhage.  Had associated hydrocephalus.  Hypertensive in the ER.  Initially was awake and following commands but became lethargic after administration of fentanyl.  Patient was intubated to protect airway and transferred to neuro ICU.  No documented past medical history.  During hospitalization patient underwent craniotomy on 11/4 but was unable to be safely extubated and eventually underwent tracheostomy placement on 11/12.  Hospital course complicated by Citrobacter and Haemophilus tracheobronchitis.  She was also found to have a large thyroid mass and underwent total thyroidectomy on 11/12.  Postoperatively she developed bleeding around the trach site on 11/21 and ENT was consulted with recommendations made.  Days later on 11/23 a new subglottic obstruction was observed on a modified barium swallow but no evidence of obstruction or abscess on CT scan.  Unable to pass swallowing evaluation so PEG tube placed.  Briefly required mechanical  ventilation on 12/4 but able to be transition to trach collar and moved out of the ICU as of 12/7.  Recurrent issues with desaturations and mucous plugging excessive secretions with skin breakdown around the trach insertion site.  12/7 palliative team met with patient and spoke with daughter during evaluation by telephone.  Patient currently remains a full code and daughter Darrol Jump is Air traffic controller with patient being delegated as primary Media planner.  Significant events 11/03 Admitted 11/04 craniotomy with clipping of left PICA 11/12 total thyroidectomy, tracheostomy 11/13 start ABx for fever and tracheobronchitis 11/16 Now 11 days post clipping. Drain stopped yesterday so left clamped. Was interactive with PT yesterday. This morning, agitated and complaining of back pain. More somnolent following fentanyl this morning. Stable from neuro check  11/21 pulm called back again; trach bleeding. we called ENT 11/23 subglottic obstruction which is new observed on MBS-->CT neck ordered to r/o infection. Also abx started. As she failed MBS IR consulted for PEG. Should be last day for nimodipine. We asked IM to assist w. Care  12/4: Put on mechanical ventilation and transferred to ICU 12/6 tolerating PS  Subjective: Patient awake but not opening eyes.  Speech therapy at bedside and using music therapy.  Patient noted to be tapping right foot briskly to music.  Objective: Vitals:   03/11/20 0346 03/11/20 0420  BP: 126/72   Pulse: 75   Resp: 16 18  Temp: 98.4 F (36.9 C)   SpO2: 98% 99%    Intake/Output Summary (Last 24 hours) at 03/11/2020 0751 Last data filed at  03/11/2020 0422 Gross per 24 hour  Intake 50.32 ml  Output 250 ml  Net -199.68 ml   Filed Weights   03/06/20 0500 03/10/20 0500 03/11/20 0500  Weight: 59.7 kg 63.6 kg 64 kg    Exam: General: Awake but not opening eyes.  Listening to music and tapping right foot briskly in time with music. Pulmonary: Bilateral  lung sounds coarse to auscultation with expiratory rhonchi, continues with some thick yellow-green secretions from trach, no increased work of breathing.  FiO2 21% per trach collar Cardiac: S1-S2, no murmurs, no JVD, no peripheral edema.  No resting tachycardia Abdomen: Soft nontender nondistended, PEG tube for tube feedings and meds Neurological: Awake but not opening eyes when requested.  Not verbally responding but as noted above is listening to music and tapping right foot in time with music.  Does not appear to have any focal neurological deficits.  Currently in restraints on speech therapy at bed working with patient.    Assessment/Plan: Acute problems: Acute subarachnoid hemorrhage/clipping ruptured left PICA aneurysm/mild metabolic encephalopathy -Has completed cerebral vasospasm prophylaxis with nimodipine -Continues to have waxing and waning mentation.  Discussed with speech therapy who stated that after initial neurological insult and vent wean that patient was actually more awake.  Subsequently after second placement on ventilator patient began experiencing worsening of mentation -On Seroquel 50 mg HS-12/14 will taper to 25 mg and if no agitation over several days will likely discontinue in the event this is contributing to her inconsistent mentation -Continue regular PT/OT/SLP -DC scalp staples 12/14  Acute on chronic respiratory failure (multifactorial)/tracheostomy tube dependent -Patient underwent combination trach placement and thyroidectomy on same date. -SLP states that thyroid wound communicates with trach wound which has resulted in delayed healing -Continues to have both extra and intra tracheal secretions-utilizing topical bacitracin and frequent wound care at trach site -See below regarding reemergence of new tracheobronchitis  ? Seizure activity -Received information from attending physician (after I had waited the patient) that he had been called by nursing staff to  report that patient was having twitching involving her right arm and leg which is new and she was not as responsive as previous -Suspect possible seizure activity in patient with known recent brain injury from subarachnoid hemorrhage -Obtain EEG-attending to give Ativan x1 -If continues to have seizure-like movements may need to consult neurology regarding recommendations for possible initiation of AED  Acute MSSA tracheobronchitis -Increased secretions over the past 5 to 7 days with cultures positive for pansensitive MSSA -IV Ancef initiated on 12/13 with recommendation from PCCM for total of 7 days therapy  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   Weight as of this encounter: 64 kg.   Other problems: Hypertension -Newly diagnosed this admission; blood pressure stable on combination Norvasc, labetalol and hydralazine  Diabetes mellitus 2 -New diagnosis this admission with A1c 6.2 -CBGs stable on Lantus and SSI  Anemia of chronic illness -Hgb stable between 7.5 and 8.5  Wounds Incision (Closed) 01/31/20 Head (Active)  Date First Assessed/Time First Assessed: 01/31/20 0138   Location: Head    Assessments 01/31/2020  1:00 AM 03/11/2020  8:00 AM  Dressing Type --  None  Dressing -- Dry  Site / Wound Assessment -- Clean;Dry  Margins Attached edges (approximated) Attached edges (approximated)  Closure Staples Staples  Drainage Amount -- None     No Linked orders to display     Incision (Closed) 02/08/20 Neck  Other (Comment) (Active)  Date First Assessed/Time First Assessed: 02/08/20 1335   Location: Neck  Location Orientation: Other (Comment)    Assessments 02/10/2020 12:00 AM 03/10/2020  8:00 PM  Dressing Type Gauze (Comment) Gauze (Comment)  Dressing Changed --  Dressing Change Frequency PRN --  Site / Wound Assessment Clean --  Closure None --  Drainage Amount Minimal --  Drainage Description Serosanguineous --  Treatment Cleansed --     No Linked orders to display     Wound / Incision (Open or Dehisced) 02/08/20 Skin tear Back Left pink (Active)  Date First Assessed/Time First Assessed: 02/08/20 2000   Wound Type: Skin tear  Location: Back  Location Orientation: Left  Wound Description (Comments): pink    Assessments 02/08/2020  8:00 PM 03/11/2020  8:00 AM  Dressing Type Foam - Lift dressing to assess site every shift Foam - Lift dressing to assess site every shift  Dressing Status Clean;Dry;Intact None  Dressing Change Frequency -- PRN  Site / Wound Assessment Pink Clean;Dry  Peri-wound Assessment Intact Intact  Drainage Amount None None  Treatment Cleansed --     No Linked orders to display     Pressure Injury 02/12/20 Throat Mid Stage 2 -  Partial thickness loss of dermis presenting as a shallow open injury with a red, pink wound bed without slough. (Active)  Date First Assessed/Time First Assessed: 02/12/20 1500   Location: Throat  Location Orientation: Mid  Staging: Stage 2 -  Partial thickness loss of dermis presenting as a shallow open injury with a red, pink wound bed without slough.  Present on Admis...    Assessments 02/12/2020  8:00 AM 03/11/2020  8:00 AM  Dressing Type Foam - Lift dressing to assess site every shift Foam - Lift dressing to assess site every shift  Dressing -- Reinforced;Clean;Intact  Dressing Change Frequency PRN PRN  State of Healing -- Non-healing  Site / Wound Assessment Red;Painful;Other (Comment) Red  Peri-wound Assessment Intact;Erythema  (blanchable) --  Margins Unattached edges (unapproximated) Unattached edges (unapproximated)  Drainage Amount Minimal Moderate  Drainage Description Serosanguineous Serous  Treatment Cleansed --     No Linked orders to display    Data Reviewed: Basic Metabolic Panel: Recent Labs  Lab 03/08/20 0021  NA 136  K 4.0  CL 99  CO2 26  GLUCOSE 129*  BUN 16  CREATININE 0.48  CALCIUM 9.3   Liver Function Tests: No results for input(s): AST, ALT, ALKPHOS, BILITOT, PROT, ALBUMIN in the last 168 hours. No results for input(s): LIPASE, AMYLASE in the last 168 hours. No results for input(s): AMMONIA in the last 168 hours. CBC: Recent Labs  Lab 03/08/20 0021  WBC 5.9  HGB 9.5*  HCT 30.9*  MCV 86.1  PLT 149*   Cardiac Enzymes: No results for input(s): CKTOTAL, CKMB, CKMBINDEX, TROPONINI in the last 168 hours. BNP (last 3 results) No results for input(s): BNP in the last 8760 hours.  ProBNP (last 3 results) No results for input(s): PROBNP in the last 8760 hours.  CBG: Recent Labs  Lab 03/10/20 2051 03/10/20 2308 03/11/20 0344 03/11/20 0521 03/11/20 0746  GLUCAP 98 105* 157* 144* 81    Recent Results (from the past 240 hour(s))  Culture, respiratory (non-expectorated)     Status: None   Collection Time: 03/05/20 12:01 PM   Specimen: Tracheal Aspirate; Respiratory  Result Value Ref Range Status   Specimen Description TRACHEAL ASPIRATE  Final   Special Requests NONE  Final   Gram Stain   Final    RARE WBC PRESENT,BOTH PMN AND MONONUCLEAR MODERATE GRAM NEGATIVE RODS ABUNDANT GRAM POSITIVE COCCI Performed at [facility], [place] [address], [place], [place] [address]    Culture ABUNDANT STAPHYLOCOCCUS AUREUS  Final   Report Status 03/08/2020 FINAL  Final   Organism ID, Bacteria STAPHYLOCOCCUS AUREUS  Final      Susceptibility   Staphylococcus aureus - MIC*    CIPROFLOXACIN <=0.5 SENSITIVE Sensitive     ERYTHROMYCIN <=0.25 SENSITIVE Sensitive     GENTAMICIN  <=0.5 SENSITIVE Sensitive     OXACILLIN 0.5 SENSITIVE Sensitive     TETRACYCLINE >=16 RESISTANT Resistant     VANCOMYCIN 1 SENSITIVE Sensitive     TRIMETH/SULFA <=10 SENSITIVE Sensitive     CLINDAMYCIN <=0.25 SENSITIVE Sensitive     RIFAMPIN <=0.5 SENSITIVE Sensitive     Inducible Clindamycin NEGATIVE Sensitive     * ABUNDANT STAPHYLOCOCCUS AUREUS     Studies: No results found.  Scheduled Meds: . amLODipine  10 mg Per Tube Daily  . chlorhexidine gluconate (MEDLINE KIT)  15 mL Mouth Rinse BID  . Chlorhexidine Gluconate Cloth  6 each Topical Daily  . docusate  100 mg Per Tube BID  . feeding supplement (PROSource TF)  45 mL Per Tube BID  . heparin  5,000 Units Subcutaneous Q8H  . hydrALAZINE  25 mg Per Tube Q8H  . insulin aspart  0-20 Units Subcutaneous Q4H  . labetalol  300 mg Per Tube TID  . levothyroxine  100 mcg Per Tube Q0600  . mouth rinse  15 mL Mouth Rinse 10 times per day  . pantoprazole sodium  40 mg Per Tube Daily  . polyethylene glycol  17 g Per Tube Daily  . polyvinyl alcohol  1 drop Left Eye BID  . QUEtiapine  50 mg Per Tube QHS  . sodium chloride flush  10-40 mL Intracatheter Q12H   Continuous Infusions: .  ceFAZolin (ANCEF) IV 1 g (03/11/20 0530)  . feeding supplement (OSMOLITE 1.2 CAL) 1,000 mL (03/11/20 0605)  . 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   H/O total thyroidectomy   Tracheostomy status ([place])   Acute respiratory failure ([place])   Ventilator dependence ([place])   PEG (percutaneous endoscopic gastrostomy) status ([place])   Hyperglycemia   Consultants:  Neurosurgery  PCCM  ENT    Procedures: 11/03 ETT >>11/09 11/03 EVD >> 11/09 ETT >> 11/12 11/12 Tracheostomy >>   Antibiotics: Anti-infectives (From admission, onward)   Start      Dose/Rate Route Frequency Ordered Stop   03/10/20 1515  ceFAZolin (ANCEF) IVPB 1 g/50 mL premix        1 g 100 mL/hr over 30 Minutes Intravenous Every 8 hours 03/10/20 1422     02/22/20 1845  ceFAZolin (ANCEF) IVPB 2g/100 mL premix  Status:  Discontinued        2 g 200 mL/hr over 30 Minutes Intravenous  Once 02/22/20 1758 03/01/20 0817   02/22/20 1522  ceFAZolin (ANCEF) IVPB  2g/100 mL premix        over 30 Minutes  Continuous PRN 02/22/20 1523 02/22/20 1510   02/22/20 1514  ceFAZolin (ANCEF) 2-4 GM/100ML-% IVPB       Note to Pharmacy: Luis Abed   : cabinet override      02/22/20 1514 02/23/20 0329   02/19/20 1500  ceFEPIme (MAXIPIME) 2 g in sodium chloride 0.9 % 100 mL IVPB        2 g 200 mL/hr over 30 Minutes Intravenous Every 12 hours 02/19/20 1408 02/26/20 0836   02/10/20 1445  cefTRIAXone (ROCEPHIN) 2 g in sodium chloride 0.9 % 100 mL IVPB        2 g 200 mL/hr over 30 Minutes Intravenous Every 24 hours 02/10/20 1348 02/15/20 1624   02/09/20 1500  piperacillin-tazobactam (ZOSYN) IVPB 3.375 g  Status:  Discontinued        3.375 g 12.5 mL/hr over 240 Minutes Intravenous Every 8 hours 02/09/20 0755 02/10/20 1348   02/09/20 0845  piperacillin-tazobactam (ZOSYN) IVPB 3.375 g        3.375 g 100 mL/hr over 30 Minutes Intravenous  Once 02/09/20 0755 02/09/20 1027        Time spent: 25 minutes    Erin Hearing ANP  [facility] Pager [phone].

## 2020-03-11 NOTE — Procedures (Signed)
Patient Name: Alexanderia Gorby  MRN: [id number]  Epilepsy Attending: Lora Havens  Referring Physician/Provider: Debbora Lacrosse, NP Date: 03/11/2020 Duration:  25.51 mins  Patient history: 64 year old female with acute subarachnoid hemorrhage status post clipping of left PICA aneurysm and altered mental status.  Patient was also noted to have twitching of right arm and leg.  EEG to evaluate for seizures.  Level of alertness:  lethargic  AEDs during EEG study: None  Technical aspects: This EEG study was done with scalp electrodes positioned according to the 10-20 International system of electrode placement. Electrical activity was acquired at a sampling rate of 500Hz  and reviewed with a high frequency filter of 70Hz  and a low frequency filter of 1Hz . EEG data were recorded continuously and digitally stored.   Description: No posterior dominant rhythm was seen. EEG showed continuous generalized 3 to 6 Hz theta-delta slowing. Periodic discharges with triphasic morphology at 0.5hz  were noted. Hyperventilation and photic stimulation were not performed.     ABNORMALITY - Continuous slow, generalized - Periodic discharges with triphasic morphology, generalized  IMPRESSION: This study is suggestive of moderate diffuse encephalopathy, nonspecific etiology.  Periodic discharges with triphasic morphology can be on the ictal-interictal continuum but given the frequency and the morphology, these are more likely secondary to toxic-metabolic causes.  No seizures or definite epileptiform discharges were seen throughout the recording.  If concern for ictal-interictal activity persists, please consider prolonged monitoring.  Tammie Yanda Barbra Sarks

## 2020-03-11 NOTE — Evaluation (Signed)
Speech Language Pathology Evaluation Patient Details Name: Stacy Moran MRN: [id number] DOB: [birth year] Today's Date: 03/11/2020 Time: 6160-7371 SLP Time Calculation (min) (ACUTE ONLY): 17 min  Problem List:  Patient Active Problem List   Diagnosis Date Noted  . PEG (percutaneous endoscopic gastrostomy) status ([place]) 03/08/2020  . Hyperglycemia 03/08/2020  . Acute respiratory failure ([place])   . Ventilator dependence ([place])   . 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 Tracheostomy status ([place]) 02/19/2020  . Abdominal distention   . Ruptured aneurysm of artery ([place])   . SAH (subarachnoid hemorrhage) ([place])   . Subarachnoid bleed ([place])   . Tachypnea   . Prediabetes   . Hypokalemia   . Leukocytosis   . Ileus, postoperative ([place])   . Pressure injury of skin 02/13/2020  . Ruptured cerebral aneurysm ([place]) 01/30/2020   Past Medical History: History reviewed. No pertinent past medical history. Past Surgical History:  Past Surgical History:  Procedure Laterality Date  . CRANIOTOMY Left 01/30/2020   Procedure: LEFT FAR LATERAL CRANIOTOMY FOR ANEURYSM CLIPPING;  Surgeon: [name], MD;  Location: [place];  Service: Neurosurgery;  Laterality: Left;  . DIRECT LARYNGOSCOPY N/A 02/29/2020   Procedure: DIRECT LARYNGOSCOPY;  Surgeon: [name], MD;  Location: [place];  Service: ENT;  Laterality: N/A;  . IR ANGIO INTRA EXTRACRAN SEL INTERNAL CAROTID BILAT MOD SED  01/30/2020  . IR ANGIO VERTEBRAL SEL VERTEBRAL UNI L MOD SED  01/30/2020  . IR GASTROSTOMY TUBE MOD SED  02/22/2020  . RADIOLOGY WITH ANESTHESIA N/A 01/30/2020   Procedure: IR WITH ANESTHESIA;  Surgeon: [name], MD;  Location: [place];  Service: Radiology;  Laterality: N/A;  . THYROIDECTOMY N/A 02/08/2020   Procedure: THYROIDECTOMY;  Surgeon: [name], MD;  Location: [place];  Service: ENT;  Laterality: N/A;  . TRACHEOSTOMY TUBE PLACEMENT N/A 02/08/2020    Procedure: TRACHEOSTOMY;  Surgeon: [name], MD;  Location: [facility];  Service: ENT;  Laterality: N/A;  . TRACHEOSTOMY TUBE PLACEMENT N/A 02/29/2020   Procedure: TRACHEOSTOMY EXCHANGE;  Surgeon: [name], MD;  Location: [place];  Service: ENT;  Laterality: N/A;   HPI:  64 y/o presented with large subarachnoid hemorrhage due to ruptured PICA with resultant hydrocephalus. Admitted on 11/3, [place] on 11/4, vented until 11/12 when pt was trached. Also found to have multinodular goiter complicating respiratory failure so total thyroidectomy also completed on 11/12. Drains removed on 11/14. ATC on 11/15. Changed to 6 cuffledd on 11/17.  There is question of left lingual deviation in notes.    Assessment / Plan / Recommendation Clinical Impression  Pt seen for cognitive linguisitic eval after several weeks of attempts of SLP addressing cognition through PMSV and swallowing tx. Pt has now declined to the point where those efforts are futile and so SLE initiated for dedicated cognitive therapy. Pt is lethargic, altered even from hospital baseline in which she was restless, pulling at lines and leads but attentive to commands and able to express wants and needs by mouthing words. Today pt keeps eyes closed, does not follow commands with either upper extremity, though she is more responsive with her left hand and will respond to hand holding with squeezing and waving her hand around. When reggae music was played pt tapped her foot to the beat but still would not open her eyes appropriately. Will follow to see if mentation improves once sedation levels are changed.    SLP Assessment  SLP Recommendation/Assessment: Patient needs  continued Speech [place] Pathology Services SLP Visit Diagnosis: Attention and concentration deficit Attention and concentration deficit following: Other Nontraumatic ICH    Follow Up Recommendations  Inpatient Rehab    Frequency and Duration min 2x/week  2 weeks      SLP  Evaluation Cognition  Overall Cognitive Status: Impaired/Different from baseline Arousal/Alertness: Suspect due to medications Orientation Level: Disoriented X4 Attention: Focused Focused Attention: Impaired Focused Attention Impairment: Verbal basic;Functional basic       Comprehension  Auditory Comprehension Overall Auditory Comprehension: Impaired Commands: Impaired One Step Basic Commands: 0-24% accurate    Expression Verbal Expression Overall Verbal Expression: Impaired Initiation: Impaired   Oral / Motor  Oral Motor/Sensory Function Overall Oral Motor/Sensory Function: Other (comment) Facial ROM: Within Functional Limits (does not open mouth, hx of mod weakness) Facial Symmetry: Within Functional Limits Facial Strength: Within Functional Limits Facial Sensation: Within Functional Limits Lingual ROM: Reduced left;Suspected CN XII (hypoglossal) dysfunction Lingual Symmetry: Abnormal symmetry left;Suspected CN XII (hypoglossal) dysfunction Lingual Strength: Suspected CN XII (hypoglossal) dysfunction   GO                   Herbie Baltimore, MA CCC-SLP  Acute Rehabilitation Services Pager [phone] Office [phone]   [name] 03/11/2020, 9:14 AM

## 2020-03-12 ENCOUNTER — Inpatient Hospital Stay (HOSPITAL_COMMUNITY): Payer: Self-pay

## 2020-03-12 DIAGNOSIS — R569 Unspecified convulsions: Secondary | ICD-10-CM

## 2020-03-12 LAB — COMPREHENSIVE METABOLIC PANEL
ALT: 17 U/L (ref 0–44)
AST: 15 U/L (ref 15–41)
Albumin: 3 g/dL — ABNORMAL LOW (ref 3.5–5.0)
Alkaline Phosphatase: 81 U/L (ref 38–126)
Anion gap: 11 (ref 5–15)
BUN: 23 mg/dL (ref 8–23)
CO2: 26 mmol/L (ref 22–32)
Calcium: 9.5 mg/dL (ref 8.9–10.3)
Chloride: 100 mmol/L (ref 98–111)
Creatinine, Ser: 0.53 mg/dL (ref 0.44–1.00)
GFR, Estimated: >60 mL/min (ref >60)
Glucose, Bld: 145 mg/dL — ABNORMAL HIGH (ref 70–99)
Potassium: 4 mmol/L (ref 3.5–5.1)
Sodium: 137 mmol/L (ref 135–145)
Total Bilirubin: 0.4 mg/dL (ref 0.3–1.2)
Total Protein: 6.6 g/dL (ref 6.5–8.1)

## 2020-03-12 LAB — CBC
HCT: 31.2 % — ABNORMAL LOW (ref 36.0–46.0)
Hemoglobin: 10.2 g/dL — ABNORMAL LOW (ref 12.0–15.0)
MCH: 27.7 pg (ref 26.0–34.0)
MCHC: 32.7 g/dL (ref 30.0–36.0)
MCV: 84.8 fL (ref 80.0–100.0)
Platelets: 245 10*3/uL (ref 150–400)
RBC: 3.68 MIL/uL — ABNORMAL LOW (ref 3.87–5.11)
RDW: 19.5 % — ABNORMAL HIGH (ref 11.5–15.5)
WBC: 6.9 10*3/uL (ref 4.0–10.5)
nRBC: 0 % (ref 0.0–0.2)

## 2020-03-12 LAB — GLUCOSE, CAPILLARY
Glucose-Capillary: 105 mg/dL — ABNORMAL HIGH (ref 70–99)
Glucose-Capillary: 127 mg/dL — ABNORMAL HIGH (ref 70–99)
Glucose-Capillary: 132 mg/dL — ABNORMAL HIGH (ref 70–99)
Glucose-Capillary: 154 mg/dL — ABNORMAL HIGH (ref 70–99)
Glucose-Capillary: 162 mg/dL — ABNORMAL HIGH (ref 70–99)
Glucose-Capillary: 170 mg/dL — ABNORMAL HIGH (ref 70–99)
Glucose-Capillary: 91 mg/dL (ref 70–99)

## 2020-03-12 LAB — MAGNESIUM: Magnesium: 2.2 mg/dL (ref 1.7–2.4)

## 2020-03-12 LAB — PHOSPHORUS: Phosphorus: 3.9 mg/dL (ref 2.5–4.6)

## 2020-03-12 IMAGING — MR MR HEAD W/O CM
8 of 10 series · 34 of 48 positions shown · non-contrast
Comparison: CT head [DATE].

CLINICAL DATA: Seizure. Subarachnoid hemorrhage status post left
PICA aneurysm clipping.

EXAM:
MRI HEAD WITHOUT CONTRAST
TECHNIQUE: Multiplanar, multiecho pulse sequences of the brain and surrounding
structures were obtained without intravenous contrast.

[Series 4: DWI · axial · 3.0mm · 1.09mm/px · z∈[-51,+89]mm · 9 of 100 slices shown (1 of 4)]
[im 1/100]
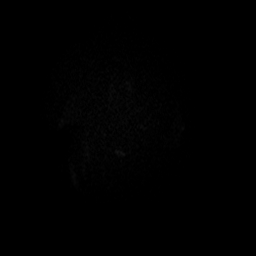
[im 13/100]
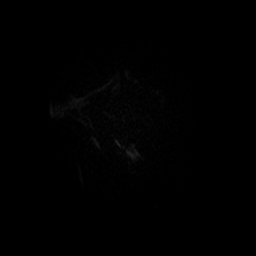
[im 25/100]
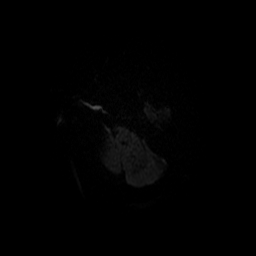
[im 38/100]
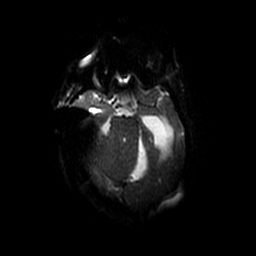
[im 50/100]
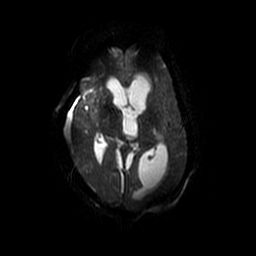
[im 62/100]
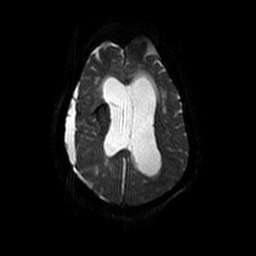
[im 75/100]
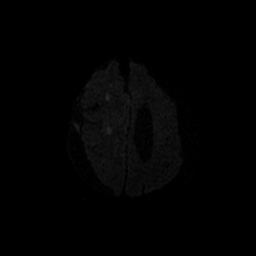
[im 87/100]
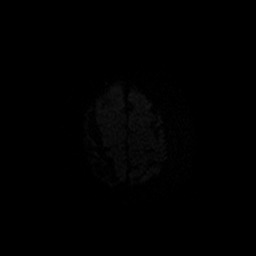
[im 100/100]
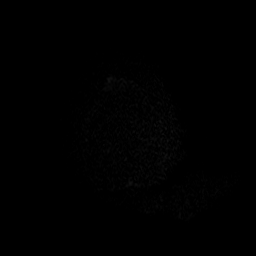

[Series 5: DWI · coronal · 5.0mm · 1.09mm/px · 6 of 68 slices shown (2 of 4)]
[im 1/68]
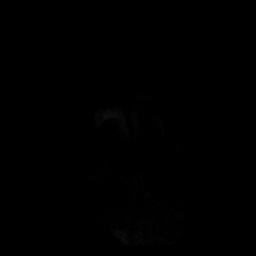
[im 14/68]
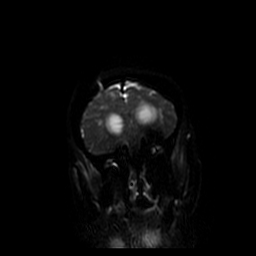
[im 27/68]
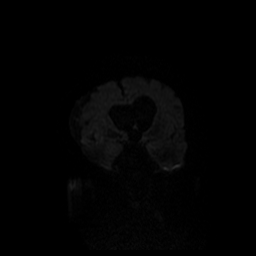
[im 41/68]
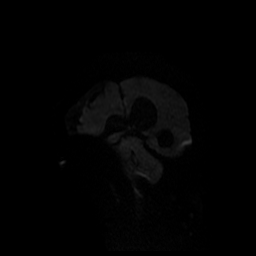
[im 54/68]
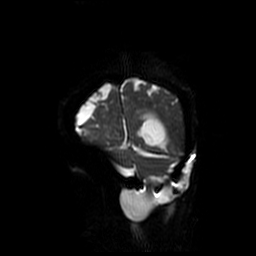
[im 68/68]
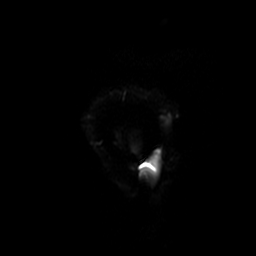

[Series 6: T1 · sagittal · 5.0mm · 0.47mm/px · 2 of 26 slices shown]
[im 1/26]
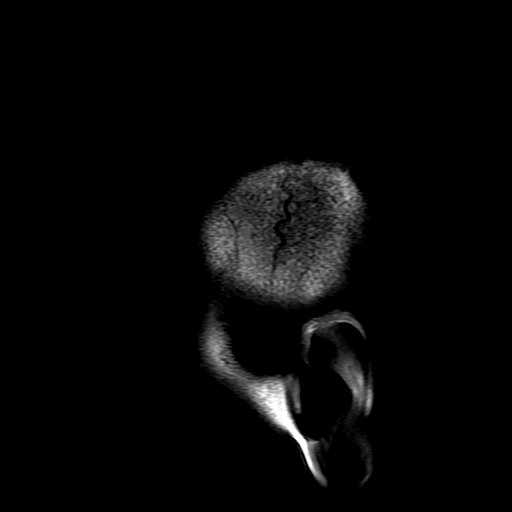
[im 13/26]
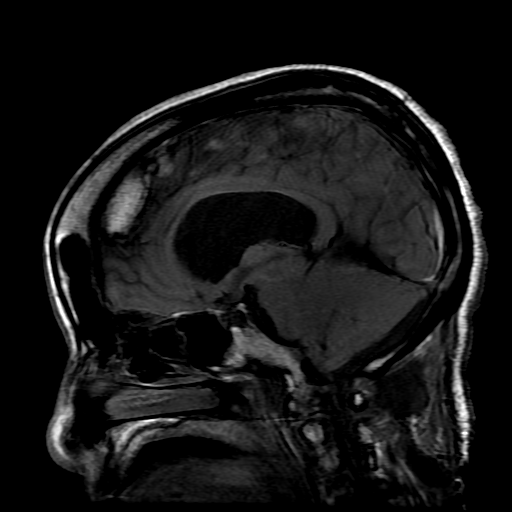

[Series 7: T2 · axial · 5.0mm · 0.47mm/px · z∈[-40,+100]mm · 3 of 26 slices shown]
[im 1/26]
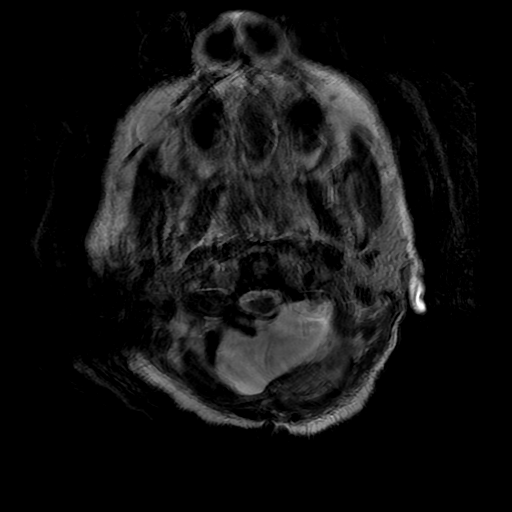
[im 13/26]
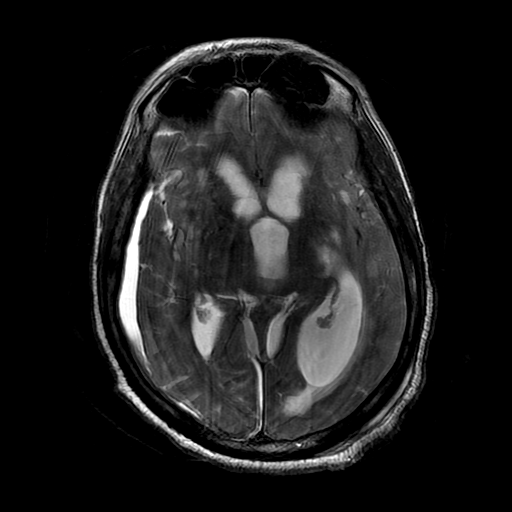
[im 26/26]
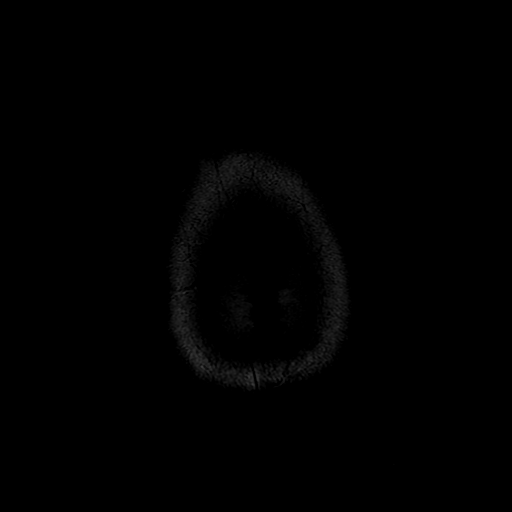

[Series 8: FLAIR · axial · 5.0mm · 0.47mm/px · z∈[-40,+100]mm · 3 of 26 slices shown]
[im 1/26]
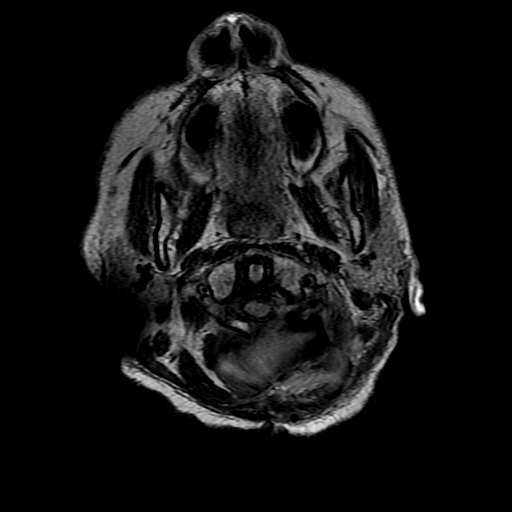
[im 13/26]
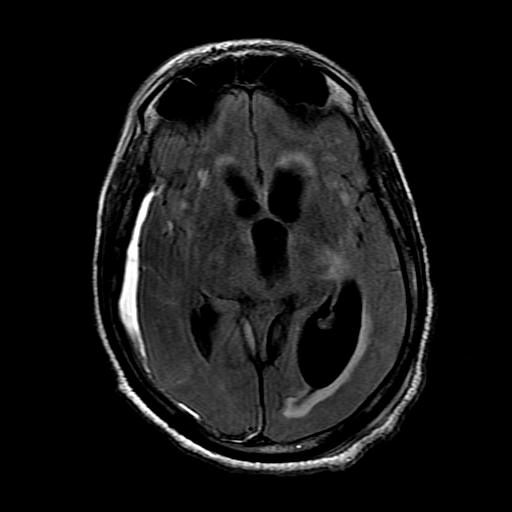
[im 26/26]
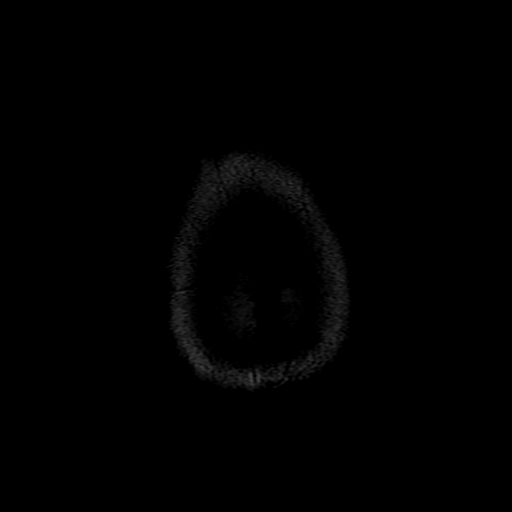

[Series 11: T2 post-contrast · coronal · 5.0mm · 0.39mm/px · 3 of 27 slices shown]
[im 1/27]
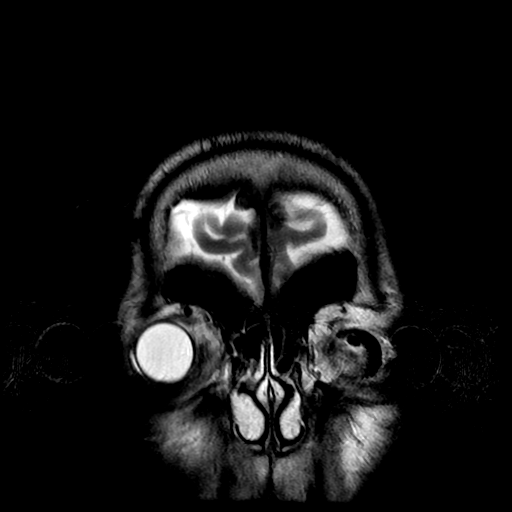
[im 14/27]
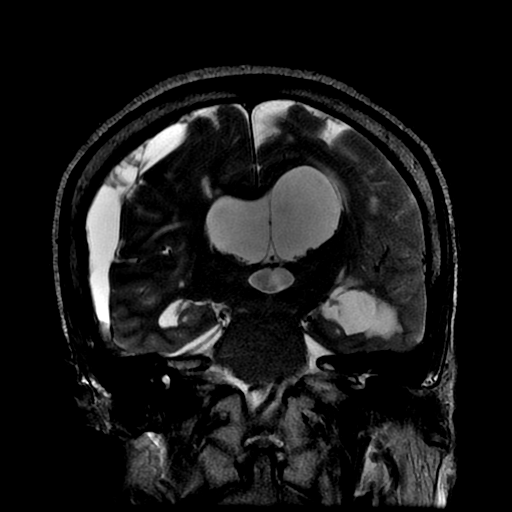
[im 27/27]
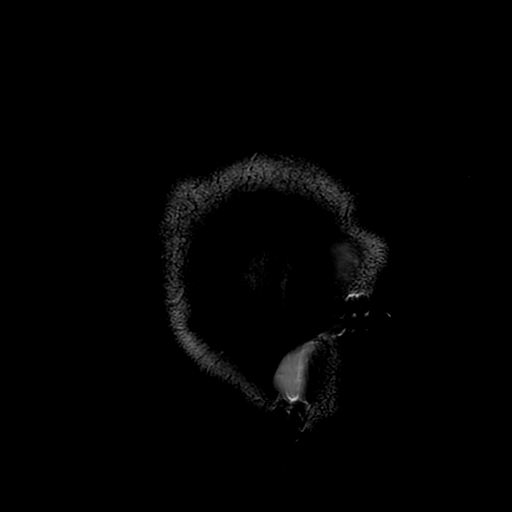

[Series 400: DWI · axial · 3.0mm · 1.09mm/px · z∈[-51,+89]mm · 5 of 51 slices shown (3 of 4)]
[im 1/51]
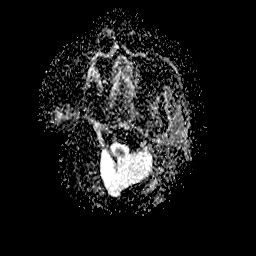
[im 13/51]
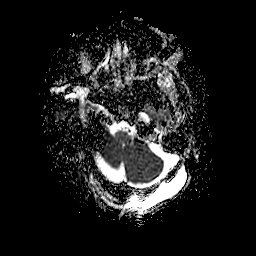
[im 26/51]
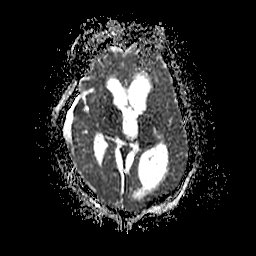
[im 38/51]
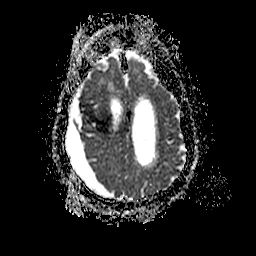
[im 51/51]
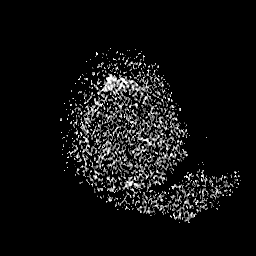

[Series 500: DWI · coronal · 5.0mm · 1.09mm/px · 3 of 34 slices shown (4 of 4)]
[im 1/34]
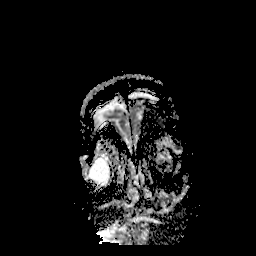
[im 17/34]
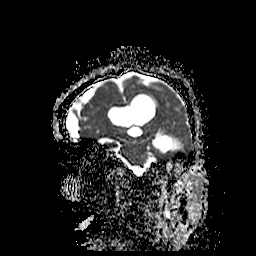
[im 34/34]
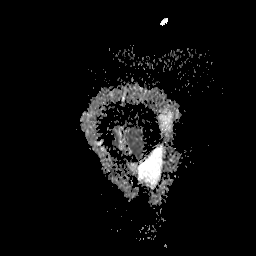

[34 of 48 positions shown; findings below may reference images not displayed]

FINDINGS: Brain: Redemonstration of a right cerebral convexity fluid
collection which has internal webs and measures up to 1.6 cm, likely
increased from prior when comparing across modalities. Leftward
midline shift is also slightly increased, now measuring
approximately 6 mm. Multiple small infarcts scattered throughout the
right frontal and parietal lobes and involving the corpus callosum.
Bilateral retro cerebellar CSF collections, which appears slightly
increased relative to prior. There is effacement fourth ventricle.
Interval development of hydrocephalus. Additional scattered T2/FLAIR
hyperintensities within the white matter, likely related to chronic
microvascular ischemic disease.

Vascular: Major arterial flow voids are maintained at the skull
base.

Skull and upper cervical spine: Prior left cerebellar calvarial
craniotomy. Similar overlying fluid collection, suggestive of
pseudomeningocele.

Sinuses/Orbits: Sinuses are clear.  Left globe prosthesis.
IMPRESSION: 1. Multiple small infarcts scattered throughout the right frontal
and parietal lobes and involving the corpus callosum.
2. Redemonstration of a right cerebral convexity fluid collection
which measures up to 1.6 cm, which is likely increased from prior
when comparing across modalities. Leftward midline shift is also
slightly increased, now measuring approximately 6 mm.
3. Slight increase in retrocerebellar fluid in this patient with
prior left cerebellar craniotomy with overlying
pseudomeningocele/fluid collection.
4. Interval development of obstructive hydrocephalus, which may be
related to effacement of the fourth ventricle by the aforementioned
increased retrocerebellar fluid.

Findings discussed with Dr. GIUSEPPE E via telephone at [DATE].

## 2020-03-12 MED ORDER — LEVETIRACETAM 100 MG/ML PO SOLN
750.0000 mg | Freq: Two times a day (BID) | ORAL | Status: DC
  Administered 2020-03-12 – 2020-04-22 (×81): 750 mg
  Filled 2020-03-12 (×83): qty 10

## 2020-03-12 MED ORDER — SODIUM CHLORIDE 0.9 % IV SOLN
2000.0000 mg | Freq: Once | INTRAVENOUS | Status: AC
  Administered 2020-03-12: 2000 mg via INTRAVENOUS
  Filled 2020-03-12: qty 20

## 2020-03-12 NOTE — Progress Notes (Signed)
Attempted to call daughter x3 without success. Left message. Will try again later/tomorrow am.

## 2020-03-12 NOTE — Progress Notes (Addendum)
Spoke to [employer] PA w/ NS- non emergent VP shunt indicated- NS team will contact family

## 2020-03-12 NOTE — Progress Notes (Addendum)
[facility] PROGRESS NOTE  Stacy Moran JQB:[id number] DOB: 1955/12/05 DOA: 01/30/2020 PCP: No primary care provider on file.  Status: Remains inpatient appropriate because:Altered mental status, Unsafe d/c plan and IV treatments appropriate due to intensity of illness or inability to take PO   Dispo: The patient is from: Home              Anticipated d/c is to: SNF              Anticipated d/c date is: > 3 days              Patient currently is not medically stable to d/c.  Barriers to discharge: Currently requiring IV antibiotics for recurrent tracheobronchitis and as of 12/15 patient has developed new onset seizure activity with work-up in process.  Needs SNF that is trach capable-care application pending    Code Status: Full Family Communication: Daughter 12/9 DVT prophylaxis: SQ heparin Vaccination status: Unknown  Foley catheter: No   HPI: Admitted 11/3 to neurosurgical service after presenting with severe headache later found to be related to subarachnoid hemorrhage.  Had associated hydrocephalus.  Hypertensive in the ER.  Initially was awake and following commands but became lethargic after administration of fentanyl.  Patient was intubated to protect airway and transferred to neuro ICU.  No documented past medical history.  During hospitalization patient underwent craniotomy on 11/4 but was unable to be safely extubated and eventually underwent tracheostomy placement on 11/12.  Hospital course complicated by Citrobacter and Haemophilus tracheobronchitis.  She was also found to have a large thyroid mass and underwent total thyroidectomy on 11/12.  Postoperatively she developed bleeding around the trach site on 11/21 and ENT was consulted with recommendations made.  Days later on 11/23 a new subglottic obstruction was observed on a modified barium swallow but no evidence of obstruction or abscess on CT scan.  Unable to pass swallowing evaluation so PEG tube placed.  Briefly  required mechanical ventilation on 12/4 but able to be transition to trach collar and moved out of the ICU as of 12/7.  Recurrent issues with desaturations and mucous plugging excessive secretions with skin breakdown around the trach insertion site.  12/7 palliative team met with patient and spoke with daughter during evaluation by telephone.  Patient currently remains a full code and daughter Stacy Moran is Air traffic controller with patient being delegated as primary Media planner.  On 12/14 patient with altered mentation and twitching of right side with EEG negative for seizure activity.  On 12/15 patient had more concerning symptoms involving clonus of upper and lower extremities as well as left gaze deviation.  Duration 3 minutes.  More concerning for seizure activity.  This prompted neurology consultation and patient has been started on AEDs with seizure evaluation in progress  Significant events 11/03 Admitted 11/04 craniotomy with clipping of left PICA 11/12 total thyroidectomy, tracheostomy 11/13 start ABx for fever and tracheobronchitis 11/16 Now 11 days post clipping. Drain stopped yesterday so left clamped. Was interactive with PT yesterday. This morning, agitated and complaining of back pain. More somnolent following fentanyl this morning. Stable from neuro check  11/21 pulm called back again; trach bleeding. we called ENT 11/23 subglottic obstruction which is new observed on MBS-->CT neck ordered to r/o infection. Also abx started. As she failed MBS IR consulted for PEG. Should be last day for nimodipine. We asked IM to assist w. Care  12/4: Put on mechanical ventilation and transferred to ICU 12/6 tolerating PS  Subjective: Patient  awake and opened eyes.  Briefly wiggle toes on command earlier this morning  Later after my initial evaluation patient had seizure-like activity as described above.  Duration 3 minutes with possible postictal phase although with underlying chronic  mentation changes difficult to truly evaluate.  She appears to be back to normal mentation.  Objective: Vitals:   03/12/20 0754 03/12/20 1115  BP: 106/79   Pulse: 80 75  Resp: 20 18  Temp: 98.1 F (36.7 C)   SpO2: 98%     Intake/Output Summary (Last 24 hours) at 03/12/2020 1224 Last data filed at 03/12/2020 0830 Gross per 24 hour  Intake 150 ml  Output 900 ml  Net -750 ml   Filed Weights   03/06/20 0500 03/10/20 0500 03/11/20 0500  Weight: 59.7 kg 63.6 kg 64 kg    Exam: General: Awake and appears comfortable.  Listening to music  Pulmonary: Bilateral lung sounds coarse to auscultation with expiratory rhonchi, endotracheal suction with yellow-green secretions obtained per RT, no increased work of breathing.  FiO2 21% per trach collar-other pink-tinged drainage from trach site reported by nursing staff  Cardiac: S1-S2, no murmurs, no JVD, no peripheral edema.  No resting tachycardia Abdomen: Soft nontender nondistended, PEG tube for tube feedings and meds Neurological: Awake opens eyes briefly.  Has left globe prosthesis.  Please follow commands by wiggling toes on both feet.  Bilateral wrist restraints in place for safety    Assessment/Plan: Acute problems: Acute subarachnoid hemorrhage/clipping ruptured left PICA aneurysm/mild metabolic encephalopathy -Has completed cerebral vasospasm prophylaxis with nimodipine -Continues to have waxing and waning mentation.  Discussed with speech therapy who stated that after initial neurological insult and vent wean that patient was actually more awake.  Subsequently after second placement on ventilator patient began experiencing worsening of mentation -On Seroquel 50 mg HS-12/14 dose decreased to 25 mg and if tolerates without agitation can discontinue on 12/18 -Continue regular PT/OT/SLP -DC scalp staples 12/14  Acute on chronic respiratory failure (multifactorial)/tracheostomy tube dependent -Patient underwent combination trach  placement and thyroidectomy on same date. -SLP states that thyroid wound communicates with trach wound which has resulted in delayed healing -Continues to have both extra and intra tracheal secretions-utilizing topical bacitracin and frequent wound care at trach site -See below regarding reemergence of new tracheobronchitis  ? Seizure activity -On 12/14 patient had twitching on right side with altered mentation.  EEG consistent only with metabolic encephalopathy. -5/15 patient with more concerning seizure-like activity involving clonus of extremities as well as gaze deviated to left.  Duration of 3 minutes.  Spontaneous termination of this activity -Neurology consulted.  Has been initiated on Keppra, MRI will be obtained as well as continuous EEG **MRI brain demonstrates multiple small infarcts scattered throughout the right frontal and parietal lobes involving the corpus callosum (these findings are new), slight increase in right cerebral convexity fluid collection from previous as well as slight increase in retrocerebellar fluid and patient with known left cerebellar craniotomy with overlying pseudomeningocele/fluid collection.  There is also been interval development of obstructive hydrocephalus which may be related to effacement of the fourth ventricle by the increased retrocerebellar fluid.  Radiologist called these results to neurology Dr. 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   Weight as of this encounter: 64 kg.   Other problems: Hypertension -Newly diagnosed this admission; blood pressure stable on combination Norvasc, labetalol and hydralazine  Diabetes mellitus 2 -New diagnosis this admission with A1c 6.2 -CBGs stable on Lantus and SSI  Anemia of chronic illness -Hgb stable between 7.5 and 8.5  Wounds Incision (Closed) 01/31/20 Head (Active)  Date First Assessed/Time First Assessed: 01/31/20 0138   Location: Head    Assessments 01/31/2020  1:00 AM 03/11/2020  8:00 PM  Dressing Type -- None  Site / Wound Assessment -- Clean;Dry  Margins Attached edges (approximated) Attached edges (approximated)  Closure Staples --  Drainage Amount -- None     No Linked orders to display     Incision (Closed) 02/08/20 Neck Other (Comment) (Active)  Date First Assessed/Time First Assessed: 02/08/20 1335   Location: Neck  Location Orientation: Other (Comment)    Assessments 02/10/2020 12:00 AM 03/11/2020  8:00 PM  Dressing Type Gauze (Comment) Gauze (Comment);Foam - Lift dressing to assess site every shift  Dressing Changed Changed  Dressing Change Frequency PRN --  Site / Wound Assessment Clean --  Closure None --  Drainage Amount Minimal --  Drainage Description Serosanguineous --  Treatment Cleansed --     No Linked orders to display     Wound / Incision (Open or Dehisced) 02/08/20 Skin tear Back Left pink (Active)  Date First Assessed/Time First Assessed: 02/08/20 2000   Wound Type: Skin tear  Location: Back  Location Orientation: Left  Wound Description (Comments): pink    Assessments 02/08/2020  8:00 PM 03/11/2020  8:00 PM  Dressing Type Foam - Lift dressing to assess site every shift --  Dressing Status Clean;Dry;Intact  None  Site / Wound Assessment Pink --  Peri-wound Assessment Intact --  Drainage Amount None --  Treatment Cleansed --     No Linked orders to display     Pressure Injury 02/12/20 Throat Mid Stage 2 -  Partial thickness loss of dermis presenting as a shallow open injury with a red, pink wound bed without slough. (Active)  Date First Assessed/Time First Assessed: 02/12/20 1500   Location: Throat  Location Orientation: Mid  Staging: Stage 2 -  Partial thickness loss of dermis presenting as a shallow open injury with a red, pink wound bed without slough.  Present on Admis...    Assessments 02/12/2020  8:00 AM 03/11/2020  8:00 PM  Dressing Type Foam - Lift dressing to assess site every shift Gauze (Comment);Foam - Lift dressing to assess site every shift;Other (Comment)  Dressing -- Dry;Clean;Intact  Dressing Change Frequency PRN Daily  State of Healing -- Non-healing  Site / Wound Assessment Red;Painful;Other (Comment) Red  Peri-wound Assessment Intact;Erythema (blanchable) --  Margins Unattached edges (unapproximated) --  Drainage Amount Minimal Minimal  Drainage Description Serosanguineous Serosanguineous  Treatment Cleansed Cleansed     No Linked orders to display    Data Reviewed: Basic Metabolic Panel: Recent Labs  Lab 03/08/20 0021 03/12/20 0322  NA 136 137  K 4.0 4.0  CL 99 100  CO2 26 26  GLUCOSE 129* 145*  BUN 16 23  CREATININE 0.48 0.53  CALCIUM  9.3 9.5  MG  --  2.2  PHOS  --  3.9   Liver Function Tests: Recent Labs  Lab 03/12/20 0322  AST 15  ALT 17  ALKPHOS 81  BILITOT 0.4  PROT 6.6  ALBUMIN 3.0*   No results for input(s): LIPASE, AMYLASE in the last 168 hours. No results for input(s): AMMONIA in the last 168 hours. CBC: Recent Labs  Lab 03/08/20 0021 03/12/20 0322  WBC 5.9 6.9  HGB 9.5* 10.2*  HCT 30.9* 31.2*  MCV 86.1 84.8  PLT 149* 245   Cardiac Enzymes: No results for input(s): CKTOTAL, CKMB, CKMBINDEX, TROPONINI in the last 168  hours. BNP (last 3 results) No results for input(s): BNP in the last 8760 hours.  ProBNP (last 3 results) No results for input(s): PROBNP in the last 8760 hours.  CBG: Recent Labs  Lab 04-04-20 2040 04-04-2020 2357 03/12/20 0510 03/12/20 0748 03/12/20 1112  GLUCAP 135* 154* 170* 132* 162*    Recent Results (from the past 240 hour(s))  Culture, respiratory (non-expectorated)     Status: None   Collection Time: 03/05/20 12:01 PM   Specimen: Tracheal Aspirate; Respiratory  Result Value Ref Range Status   Specimen Description TRACHEAL ASPIRATE  Final   Special Requests NONE  Final   Gram Stain   Final    RARE WBC PRESENT,BOTH PMN AND MONONUCLEAR MODERATE GRAM NEGATIVE RODS ABUNDANT GRAM POSITIVE COCCI Performed at [facility], [place] [address] [place], [place] [address]    Culture ABUNDANT STAPHYLOCOCCUS AUREUS  Final   Report Status 03/08/2020 FINAL  Final   Organism ID, Bacteria STAPHYLOCOCCUS AUREUS  Final      Susceptibility   Staphylococcus aureus - MIC*    CIPROFLOXACIN <=0.5 SENSITIVE Sensitive     ERYTHROMYCIN <=0.25 SENSITIVE Sensitive     GENTAMICIN <=0.5 SENSITIVE Sensitive     OXACILLIN 0.5 SENSITIVE Sensitive     TETRACYCLINE >=16 RESISTANT Resistant     VANCOMYCIN 1 SENSITIVE Sensitive     TRIMETH/SULFA <=10 SENSITIVE Sensitive     CLINDAMYCIN <=0.25 SENSITIVE Sensitive     RIFAMPIN <=0.5 SENSITIVE Sensitive     Inducible Clindamycin NEGATIVE Sensitive     * ABUNDANT STAPHYLOCOCCUS AUREUS     Studies: EEG adult  Result Date: April 04, 2020 Lora Havens, MD     04-Apr-2020  4:25 PM Patient Name: Stacy Moran MRN: [id number] Epilepsy Attending: Lora Havens Referring Physician/Provider: Debbora Lacrosse, NP Date: 2020-04-04 Duration:  25.51 mins Patient history: 64 year old female with acute subarachnoid hemorrhage status post clipping of left PICA aneurysm and altered mental status.  Patient was also noted to have twitching of right arm and leg.  EEG  to evaluate for seizures. Level of alertness:  lethargic AEDs during EEG study: None Technical aspects: This EEG study was done with scalp electrodes positioned according to the 10-20 International system of electrode placement. Electrical activity was acquired at a sampling rate of 500Hz  and reviewed with a high frequency filter of 70Hz  and a low frequency filter of 1Hz . EEG data were recorded continuously and digitally stored. Description: No posterior dominant rhythm was seen. EEG showed continuous generalized 3 to 6 Hz theta-delta slowing. Periodic discharges with triphasic morphology at 0.5hz  were noted. Hyperventilation and photic stimulation were not performed.   ABNORMALITY - Continuous slow, generalized - Periodic discharges with triphasic morphology, generalized IMPRESSION: This study is suggestive of moderate diffuse encephalopathy, nonspecific etiology.  Periodic discharges with triphasic morphology can be on the ictal-interictal continuum but  given the frequency and the morphology, these are more likely secondary to toxic-metabolic causes.  No seizures or definite epileptiform discharges were seen throughout the recording. If concern for ictal-interictal activity persists, please consider prolonged monitoring. Priyanka Barbra Sarks    Scheduled Meds: . amLODipine  10 mg Per Tube Daily  . chlorhexidine gluconate (MEDLINE KIT)  15 mL Mouth Rinse BID  . Chlorhexidine Gluconate Cloth  6 each Topical Daily  . docusate  100 mg Per Tube BID  . feeding supplement (PROSource TF)  45 mL Per Tube BID  . heparin  5,000 Units Subcutaneous Q8H  . hydrALAZINE  25 mg Per Tube Q8H  . insulin aspart  0-20 Units Subcutaneous Q4H  . labetalol  300 mg Per Tube TID  . levETIRAcetam  750 mg Per Tube BID  . levothyroxine  100 mcg Per Tube Q0600  . mouth rinse  15 mL Mouth Rinse 10 times per day  . pantoprazole sodium  40 mg Per Tube Daily  . polyethylene glycol  17 g Per Tube Daily  . polyvinyl alcohol  1 drop  Left Eye BID  . QUEtiapine  25 mg Per Tube QHS  . sodium chloride flush  10-40 mL Intracatheter Q12H   Continuous Infusions: .  ceFAZolin (ANCEF) IV Stopped (03/12/20 0601)  . feeding supplement (OSMOLITE 1.2 CAL) 1,000 mL (03/11/20 2156)  . lactated ringers 10 mL/hr at 03/04/20 1710  . 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   H/O total thyroidectomy   Tracheostomy status ([place])   Acute respiratory failure ([place])   Ventilator dependence ([place])   PEG (percutaneous endoscopic gastrostomy) status ([place])   Hyperglycemia   Consultants:  Neurosurgery  PCCM  ENT  Neurology  Procedures: 11/03 ETT >>11/09 11/03 EVD >> 11/09 ETT >> 11/12 11/12 Tracheostomy >>   Antibiotics: Anti-infectives (From admission, onward)   Start     Dose/Rate Route Frequency Ordered Stop   03/10/20 1515  ceFAZolin (ANCEF) IVPB 1 g/50 mL premix        1 g 100 mL/hr over 30 Minutes Intravenous Every 8 hours 03/10/20 1422     02/22/20 1845  ceFAZolin (ANCEF) IVPB 2g/100 mL premix  Status:  Discontinued        2 g 200 mL/hr over 30 Minutes Intravenous  Once 02/22/20 1758 03/01/20 0817   02/22/20 1522  ceFAZolin (ANCEF) IVPB 2g/100 mL premix        over 30 Minutes  Continuous PRN 02/22/20 1523 02/22/20 1510   02/22/20 1514  ceFAZolin (ANCEF) 2-4 GM/100ML-% IVPB       Note to Pharmacy: Luis Abed   : cabinet override      02/22/20 1514 02/23/20 0329   02/19/20 1500  ceFEPIme (MAXIPIME) 2 g in sodium chloride 0.9 % 100 mL IVPB        2 g 200 mL/hr over 30 Minutes Intravenous Every 12 hours 02/19/20 1408 02/26/20 0836   02/10/20 1445  cefTRIAXone (ROCEPHIN) 2 g in sodium chloride 0.9 % 100 mL IVPB        2 g 200 mL/hr over 30 Minutes Intravenous Every 24 hours 02/10/20 1348 02/15/20 1624   02/09/20 1500   piperacillin-tazobactam (ZOSYN) IVPB 3.375 g  Status:  Discontinued        3.375 g 12.5 mL/hr over  240 Minutes Intravenous Every 8 hours 02/09/20 0755 02/10/20 1348   02/09/20 0845  piperacillin-tazobactam (ZOSYN) IVPB 3.375 g        3.375 g 100 mL/hr over 30 Minutes Intravenous  Once 02/09/20 0755 02/09/20 1027       Time spent: 35 minutes    Erin Hearing ANP  [facility] Pager [phone].

## 2020-03-12 NOTE — Progress Notes (Addendum)
1512 Call placed to Dr Cleotilde Neer office- spoke to ecretary- pt information given and aware has possible symptoms due to abnormal findings on recent MRI today. Informed the doctor is in the OR and either he or his PA will return my call. Personal cell phone given and as of current dictation have not received call back. Attending MD Dr Mel Almond updated

## 2020-03-12 NOTE — Plan of Care (Signed)
  Problem: Nutrition: Goal: Adequate nutrition will be maintained Outcome: Progressing   

## 2020-03-12 NOTE — Progress Notes (Signed)
  NEUROSURGERY PROGRESS NOTE   Noted progressive decline in mental status. Patient underwent MRI at recommendation of Neurology. MRI notable for multiple small infarcts and interval development of hydrocephalus.  I came by to evaluate the patient. She is resting comfortably. Moves LUE/BLE spontaneously. Will follow commands by wiggling toes on right.    A/P 64 y.o. female s/p clipping ruptured left PICA. New delayed onset hydrocephalus. Will require VP sunt placement on nonemergent basis. Will check OR schedule. Hold tube feeds after midnight. Discussed with nursing.  Will update family.

## 2020-03-12 NOTE — Progress Notes (Signed)
Dr. Gerlene Fee notified of possible seizure activity.

## 2020-03-12 NOTE — Consult Note (Addendum)
Neurology Consultation Reason for Consult: seizure Referring Physician: DR Elvin So  CC: seizure  History is obtained from: chart review  HPI: Stacy Moran is a 64 y.o. female  who initially presented on 01/30/2020 with worst headache of her life, BP 262/123 mm Hg. 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 No s/p clipping of left PICA aneurysm., trach and peg. Yesterday, patient was noted to have seizure like activity. EEG showed periodic discharges with triphasic morphology which can be on the ictal-interictal continuum.  Today patient was noted to have another episode of whole body twitching after about 3 minutes.  Therefore neurology was consulted.  Of note, per chart review, patient was at one point restless but attentive to commands and able to express her wants and needs by mouthing words.  However has now been more lethargic and not following commands.   ROS:  Unable to obtain due to altered mental status.   Unable to obtain past medical, surgical, family and social history due to ams.  Exam: Current vital signs: BP 106/79 (BP Location: Left Arm)   Pulse 75   Temp 98.1 F (36.7 C) (Axillary)   Resp 18   Ht 5\' 9"  (1.753 m)   Wt 64 kg   SpO2 98%   BMI 20.84 kg/m  Vital signs in last 24 hours: Temp:  [97.6 F (36.4 C)-98.4 F (36.9 C)] 98.1 F (36.7 C) (12/15 0754) Pulse Rate:  [69-98] 75 (12/15 1115) Resp:  [17-20] 18 (12/15 1115) BP: (106-149)/(74-118) 106/79 (12/15 0754) SpO2:  [95 %-98 %] 98 % (12/15 0754) FiO2 (%):  [21 %] 21 % (12/15 1115)   Physical Exam  Constitutional: Appears well-developed and well-nourished.  Psych: unable to assess due to ams Eyes: No scleral injection HENT: No  OP obstrucion Head: Normocephalic Cardiovascular: Normal rate and regular rhythm.  Respiratory: +trach, non-labored breathing GI: Soft.  No distension. There is no tenderness.  Skin: Warm Neuro: Will open eyes to noxious stimuli, does not follow commands, disconjugate gaze, R pupils reacting to light, withdraws to noxious stimuli in all 4 extremities  I have reviewed labs in epic and the results pertinent to this consultation are: Hgb 10.2 and HCT 31.2, Albumin 3  I have reviewed the images obtained: [place] wo contrast 02/25/2020: Unchanged size of right holo hemispheric subdural hematoma with minimal leftward midline shift.  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  Now having seizure-like activity.  Subarachnoid hemorrhage status post left PICA aneurysm clipping Acute encephalopathy, suspected postictal New onset seizures Microcytic anemia Hypoalbuminemia -Patient has subarachnoid hemorrhage status post aneurysm clipping which puts her at an increased risk for seizures.  Recommendations: -We will obtain MRI brain without contrast to look for acute abnormality -We will also obtain urinalysis to look for any UTI that could have provoked a seizure -We will load patient with Keppra 2 g and start 750 mg twice daily for maintenance -We will obtain LTM EEG if patient has any further seizures. -Seizure precautions -As needed IV Ativan 2 mg for clinical seizure lasting more than 2 minutes -Management of rest of qualities per primary team  Thank you for allowing [place] to participate in the care of this patient.  Neurology will follow.  Please  call if you have any further questions.  Zeb Comfort Epile[facility]

## 2020-03-13 DIAGNOSIS — G911 Obstructive hydrocephalus: Secondary | ICD-10-CM

## 2020-03-13 LAB — COMPREHENSIVE METABOLIC PANEL
ALT: 16 U/L (ref 0–44)
AST: 19 U/L (ref 15–41)
Albumin: 3.2 g/dL — ABNORMAL LOW (ref 3.5–5.0)
Alkaline Phosphatase: 87 U/L (ref 38–126)
Anion gap: 13 (ref 5–15)
BUN: 21 mg/dL (ref 8–23)
CO2: 26 mmol/L (ref 22–32)
Calcium: 9.6 mg/dL (ref 8.9–10.3)
Chloride: 98 mmol/L (ref 98–111)
Creatinine, Ser: 0.71 mg/dL (ref 0.44–1.00)
GFR, Estimated: >60 mL/min (ref >60)
Glucose, Bld: 134 mg/dL — ABNORMAL HIGH (ref 70–99)
Potassium: 4.4 mmol/L (ref 3.5–5.1)
Sodium: 137 mmol/L (ref 135–145)
Total Bilirubin: 0.5 mg/dL (ref 0.3–1.2)
Total Protein: 7.4 g/dL (ref 6.5–8.1)

## 2020-03-13 LAB — GLUCOSE, CAPILLARY
Glucose-Capillary: 110 mg/dL — ABNORMAL HIGH (ref 70–99)
Glucose-Capillary: 115 mg/dL — ABNORMAL HIGH (ref 70–99)
Glucose-Capillary: 120 mg/dL — ABNORMAL HIGH (ref 70–99)
Glucose-Capillary: 144 mg/dL — ABNORMAL HIGH (ref 70–99)
Glucose-Capillary: 153 mg/dL — ABNORMAL HIGH (ref 70–99)
Glucose-Capillary: 97 mg/dL (ref 70–99)

## 2020-03-13 LAB — URINALYSIS, ROUTINE W REFLEX MICROSCOPIC
Bilirubin Urine: NEGATIVE
Glucose, UA: NEGATIVE mg/dL
Hgb urine dipstick: NEGATIVE
Ketones, ur: NEGATIVE mg/dL
Leukocytes,Ua: NEGATIVE
Nitrite: NEGATIVE
Protein, ur: NEGATIVE mg/dL
Specific Gravity, Urine: 1.02 (ref 1.005–1.030)
pH: 6 (ref 5.0–8.0)

## 2020-03-13 LAB — CBC WITH DIFFERENTIAL/PLATELET
Abs Immature Granulocytes: 0.05 10*3/uL (ref 0.00–0.07)
Basophils Absolute: 0 10*3/uL (ref 0.0–0.1)
Basophils Relative: 0 %
Eosinophils Absolute: 0 10*3/uL (ref 0.0–0.5)
Eosinophils Relative: 0 %
HCT: 33.7 % — ABNORMAL LOW (ref 36.0–46.0)
Hemoglobin: 10.7 g/dL — ABNORMAL LOW (ref 12.0–15.0)
Immature Granulocytes: 1 %
Lymphocytes Relative: 26 %
Lymphs Abs: 2.1 10*3/uL (ref 0.7–4.0)
MCH: 27.3 pg (ref 26.0–34.0)
MCHC: 31.8 g/dL (ref 30.0–36.0)
MCV: 86 fL (ref 80.0–100.0)
Monocytes Absolute: 0.5 10*3/uL (ref 0.1–1.0)
Monocytes Relative: 6 %
Neutro Abs: 5.4 10*3/uL (ref 1.7–7.7)
Neutrophils Relative %: 67 %
Platelets: 267 10*3/uL (ref 150–400)
RBC: 3.92 MIL/uL (ref 3.87–5.11)
RDW: 20.5 % — ABNORMAL HIGH (ref 11.5–15.5)
WBC: 8.1 10*3/uL (ref 4.0–10.5)
nRBC: 0 % (ref 0.0–0.2)

## 2020-03-13 LAB — PROTIME-INR
INR: 1 (ref 0.8–1.2)
Prothrombin Time: 12.8 seconds (ref 11.4–15.2)

## 2020-03-13 LAB — APTT: aPTT: 35 seconds (ref 24–36)

## 2020-03-13 LAB — MRSA PCR SCREENING: MRSA by PCR: NEGATIVE

## 2020-03-13 MED ORDER — SODIUM CHLORIDE 0.9 % IV SOLN
750.0000 mg | Freq: Once | INTRAVENOUS | Status: AC
  Administered 2020-03-13: 750 mg via INTRAVENOUS
  Filled 2020-03-13: qty 7.5

## 2020-03-13 MED ORDER — FENTANYL CITRATE (PF) 100 MCG/2ML IJ SOLN
INTRAMUSCULAR | Status: AC
  Administered 2020-03-13: 100 ug
  Filled 2020-03-13: qty 2

## 2020-03-13 MED ORDER — CEFAZOLIN SODIUM-DEXTROSE 2-4 GM/100ML-% IV SOLN
2.0000 g | Freq: Three times a day (TID) | INTRAVENOUS | Status: AC
  Administered 2020-03-13 – 2020-03-16 (×11): 2 g via INTRAVENOUS
  Filled 2020-03-13 (×11): qty 100

## 2020-03-13 MED ORDER — MIDAZOLAM HCL 2 MG/2ML IJ SOLN
INTRAMUSCULAR | Status: AC
  Administered 2020-03-13: 2 mg
  Filled 2020-03-13: qty 2

## 2020-03-13 NOTE — Progress Notes (Signed)
I have reviewed the MRI from yesterday. 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 There is associated enlargement of the supratentorial ventricular system. Also seen is slight enlargment of the right parietal chronic SDH with a few mm of R>L MLS. I suspect this is a relatively chronic result of delayed hydrocephalus. Pt will require ventriculoperitoneal shunt placement, scheduled on Mon 12/20 afternoon. In the meantime, I will plan on transfer to 4N for placement of right frontal EVD in the interim.

## 2020-03-13 NOTE — Progress Notes (Signed)
OT Cancellation Note  Patient Details Name: Stacy Moran MRN: [id number] DOB: October 03, 1955   Cancelled Treatment:    Reason Eval/Treat Not Completed: Medical issues which prohibited therapy;Other (comment) Noted pt back to OR to place nonemergent VP shunt therefore transferred to ICU; will hold on OT intervention for today, will check back as pt medically appropriate.   Lanier Clam., COTA/L [facility] Services [phone] [place] 03/13/2020, 11:14 AM

## 2020-03-13 NOTE — Progress Notes (Signed)
PT Cancellation Note  Patient Details Name: Stacy Moran MRN: [id number] DOB: December 31, 1955   Cancelled Treatment:    Reason Eval/Treat Not Completed: Medical issues which prohibited therapy Noted pt back to OR to place nonemergent VP shunt therefore transferred to ICU; will hold on PT intervention for today, will check back as pt medically appropriate.   Moishe Spice, PT, DPT Acute Rehabilitation Services  Pager: [phone] Office: [place] 03/13/2020, 12:46 PM

## 2020-03-13 NOTE — Progress Notes (Signed)
  NEUROSURGERY PROGRESS NOTE   No issues overnight. Pt seen after transfer to ICU.  EXAM:  BP 140/85 (BP Location: Left Arm)   Pulse 90   Temp 99 F (37.2 C) (Axillary)   Resp (!) 28   Ht 5\' 9"  (1.753 m)   Wt 64 kg   SpO2 100%   BMI 20.84 kg/m   Eye opening to noxious stimuli Pupils 23mm reactive OD, prosthetic OS Moves all extremities spontaneously, no FC Right retromastoid region full, wound c/d/i. No leakage.  IMPRESSION:  64 y.o. female s/p SAH with delayed onset HCP. 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 Pt also with enlargement of right parietal chronic SDH with 40mm MLS.  PLAN: - Will place EVD at bedside - Plan for shunt on Mon after completion of abx for tracheobronchitis. Will consider evacuation of SDH concurrently - LTEEG if further SZ, cont Keppra   We have made multiple attempts to contact the patient's daughter over the phone  to update her without success.

## 2020-03-13 NOTE — Progress Notes (Signed)
Subjective:   ROS: Unable to obtain due to poor mental status  Examination  Vital signs in last 24 hours: Temp:  [97.4 F (36.3 C)-99 F (37.2 C)] 99 F (37.2 C) (12/16 1200) Pulse Rate:  [76-91] 90 (12/16 0827) Resp:  [18-28] 28 (12/16 0827) BP: (95-140)/(59-86) 140/85 (12/16 0827) SpO2:  [98 %-100 %] 100 % (12/16 0905) FiO2 (%):  [21 %] 21 % (12/16 1116)  General: lying in bed, not in apparent distress CVS: pulse-normal rate and rhythm RS: +trach, coarse breath sounds bilaterally Extremities: normal  Neuro: Does not open eyes to noxious stimuli, does not follow commands, disconjugate gaze, R pupils reacting to light, withdraws to noxious stimuli in all 4 extremities, tremor-like movements in right upper extremities   Basic Metabolic Panel: Recent Labs  Lab 03/08/20 0021 03/12/20 0322 03/13/20 1141  NA 136 137 137  K 4.0 4.0 4.4  CL 99 100 98  CO2 26 26 26   GLUCOSE 129* 145* 134*  BUN 16 23 21   CREATININE 0.48 0.53 0.71  CALCIUM 9.3 9.5 9.6  MG  --  2.2  --   PHOS  --  3.9  --     CBC: Recent Labs  Lab 03/08/20 0021 03/12/20 0322 03/13/20 1141  WBC 5.9 6.9 8.1  NEUTROABS  --   --  5.4  HGB 9.5* 10.2* 10.7*  HCT 30.9* 31.2* 33.7*  MCV 86.1 84.8 86.0  PLT 149* 245 267     Coagulation Studies: Recent Labs    03/13/20 1141  LABPROT 12.8  INR 1.0    Imaging Mr Brain wo contrast 03/12/2020: 1. 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 2. Redemonstration of a right cerebral convexity fluid collection which measures up to 1.6 cm, which is likely increased from prior when comparing across modalities. Leftward midline shift is also slightly increased, now measuring approximately 6 mm. 3. Slight increase in retrocerebellar fluid in this patient with prior left cerebellar craniotomy with overlying pseudomeningocele/fluid collection. 4. Interval development of obstructive  hydrocephalus, which may be related to effacement of the fourth ventricle by the aforementioned increased retrocerebellar fluid.   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  Noted to have seizure-like activity. MRI brain showed new strokes, obstructive hydrocephalus. Neurosurg planned for VPshunt.   Subarachnoid hemorrhage status post left PICA aneurysm clipping Acute encephalopathy, suspected postictal New onset seizures Obstructive hydrocpehalus Acute infarcts - No tpa due to recent bleed, unknown last known normal - Patient has subarachnoid hemorrhage status post aneurysm clipping which puts her at an increased risk for seizures.  Recommendations: - Continue keppra 750 mg twice daily for maintenance - VPShunt per neurosurg -We will obtain LTM EEG if patient has any further seizures. -Seizure precautions -As needed IV Ativan 2 mg for clinical seizure lasting more than 2 minutes -Management of rest of qualities per primary team  I have spent a total of  25 minutes with the patient reviewing hospital notes,  test results, labs and examining the patient as well as establishing an assessment and plan.  > 50% of time was spent in direct patient care.   Zeb Comfort Epile[facility] For questions after 5pm please refer to AMION to reach the Neurologist on call

## 2020-03-13 NOTE — Procedures (Signed)
PREOP DX: Hydrocephalus  POSTOP DX: Same  PROCEDURE: right frontal ventriculostomy   SURGEON: Dr. [name], MD  ANESTHESIA: IV Sedation (versed and fentanyl) with Local  EBL: Minimal  SPECIMENS: None  COMPLICATIONS: None  CONDITION: Hemodynamically stable  INDICATIONS: [name] is a 64 y.o. female initially presenting more than a month ago with subarachnoid hemorrhage.  She underwent clipping of the left PICA aneurysm.  She required tracheostomy and PEG placement.  She has been monitored on the general neuroscience floor and noted to have progressive neurologic decline.  MRI scan has demonstrated subacute development of hydrocephalus.  CSF drainage is therefore indicated.  Patient's family is unavailable to provide consent, we therefore proceed with implied emergent consent.  PROCEDURE IN DETAIL: The skin of the right frontal scalp was clipped, prepped and draped in the usual sterile fashion.  Scalp was then infiltrated with local anesthetic with epinephrine.  Previous right-sided skin incision was opened sharply, and the previous bur hole was identified and cleaned of granulation tissue. The ventricular catheter was passed in one attempt into the right lateral ventricle.  Good CSF flow was obtained.  The catheter was then tunneled subcutaneously and connected to a drainage system and the skin incision closed.  The drain was then secured in place.  FINDINGS: 1. Opening pressure ~20cmH2O 2. Clear CSF

## 2020-03-13 NOTE — Progress Notes (Addendum)
[facility] PROGRESS NOTE  Stacy Moran DGL:[id number] DOB: Aug 04, 1955 DOA: 01/30/2020 PCP: No primary care provider on file.  Status: Remains inpatient appropriate because:Altered mental status, Unsafe d/c plan and IV treatments appropriate due to intensity of illness or inability to take PO   Dispo: The patient is from: Home              Anticipated d/c is to: SNF              Anticipated d/c date is: > 3 days              Patient currently is not medically stable to d/c.  Barriers to discharge: Currently requiring IV antibiotics for recurrent tracheobronchitis and as of 12/15 patient has developed new onset seizure activity with the development of new right side infarcts as well as obstructive hydrocephalus.  Unable to obtain OR slot to place VP shunt until Monday 12/20 therefore patient being transferred to the ICU for temporary ventriculostomy placement until OR available needs    Code Status: Full Family Communication: Daughter 12/9; 12/16 multiple voicemails left with patient's daughter regarding change in status and eventual need to transfer to ICU for temporizing treatment for obstructing hydrocephalus; 12/16 1252 PM: Daughter [name] returned call and all questions answered regarding change in mother's condition DVT prophylaxis: SQ heparin Vaccination status: Unknown  Foley catheter: No   HPI: Admitted 11/3 to neurosurgical service after presenting with severe headache later found to be related to subarachnoid hemorrhage.  Had associated hydrocephalus.  Hypertensive in the ER.  Initially was awake and following commands but became lethargic after administration of fentanyl.  Patient was intubated to protect airway and transferred to neuro ICU.  No documented past medical history.  During hospitalization patient underwent craniotomy on 11/4 but was unable to be safely extubated and eventually underwent tracheostomy placement on 11/12.  Hospital course complicated by Citrobacter  and Haemophilus tracheobronchitis.  She was also found to have a large thyroid mass and underwent total thyroidectomy on 11/12.  Postoperatively she developed bleeding around the trach site on 11/21 and ENT was consulted with recommendations made.  Days later on 11/23 a new subglottic obstruction was observed on a modified barium swallow but no evidence of obstruction or abscess on CT scan.  Unable to pass swallowing evaluation so PEG tube placed.  Briefly required mechanical ventilation on 12/4 but able to be transition to trach collar and moved out of the ICU as of 12/7.  Recurrent issues with desaturations and mucous plugging excessive secretions with skin breakdown around the trach insertion site.  12/7 palliative team met with patient and spoke with daughter during evaluation by telephone.  Patient currently remains a full code and daughter Darrol Jump is Air traffic controller with patient being delegated as primary Media planner.  On 12/14 patient with altered mentation and twitching of right side with EEG negative for seizure activity.  On 12/15 patient had more concerning symptoms involving clonus of upper and lower extremities as well as left gaze deviation.  Duration 3 minutes.  More concerning for seizure activity.  This prompted neurology consultation and patient has been started on AEDs with seizure evaluation in progress.  Subsequent MRI imaging revealed acute infarcts on the right with increasing fluid on right and left contributing to obstructive hydrocephalus.  Neurosurgery evaluated on 12/15 with plans for nonemergent VP shunt on 12/16.  Unable to obtain OR slot until 12/20 therefore decision made to transfer patient to the ICU to place temporizing ventriculostomy  until OR can be completed.  Significant events 11/03 Admitted 11/04 craniotomy with clipping of left PICA 11/12 total thyroidectomy, tracheostomy 11/13 start ABx for fever and tracheobronchitis 11/16 Now 11 days post clipping.  Drain stopped yesterday so left clamped. Was interactive with PT yesterday. This morning, agitated and complaining of back pain. More somnolent following fentanyl this morning. Stable from neuro check  11/21 pulm called back again; trach bleeding. we called ENT 11/23 subglottic obstruction which is new observed on MBS-->CT neck ordered to r/o infection. Also abx started. As she failed MBS IR consulted for PEG. Should be last day for nimodipine. We asked IM to assist w. Care  12/4: Put on mechanical ventilation and transferred to ICU 12/6 tolerating PS 12/14: Seizure-like activity with negative EEG 12/15: Repeat seizure-like activity.  MRI with new right-sided infarcts as well as obstructive hydrocephalus 12/16: Able to obtain an OR slot to place nonemergent VP shunt therefore transferred to ICU for temporizing ventriculostomy  Subjective: Awake but not opening eyes or following commands.  Music playing in room and continues to move leg in time with music.  No seizure-like activity seen.  Objective: Vitals:   03/13/20 0827 03/13/20 0905  BP: 140/85   Pulse: 90   Resp: (!) 28   Temp: 98.3 F (36.8 C)   SpO2: 100% 100%    Intake/Output Summary (Last 24 hours) at 03/13/2020 1000 Last data filed at 03/13/2020 0334 Gross per 24 hour  Intake 8726 ml  Output 1200 ml  Net 7526 ml   Filed Weights   03/06/20 0500 03/10/20 0500 03/11/20 0500  Weight: 59.7 kg 63.6 kg 64 kg    Exam: General: Awake and responds to name, does not open eyes, calm Pulmonary: Bilateral lung sounds coarse to auscultation with scattered expiratory rhonchi, no increased work of breathing.  FiO2 21% per trach collar Cardiac: S1-S2, no murmurs, no JVD, no peripheral edema.  No resting tachycardia Abdomen: Soft nontender nondistended, PEG tube for tube feedings and meds Neurological: Awake and moving extremities spontaneously but does not open eyes on command.  Has left globe prosthesis.  Moving legs in time with  music playing in room    Assessment/Plan: Acute problems: Acute subarachnoid hemorrhage/clipping ruptured left PICA aneurysm/mild metabolic encephalopathy -Has completed cerebral vasospasm prophylaxis with nimodipine -Continues to have waxing and waning mentation.  Discussed with speech therapy who stated that after initial neurological insult and vent wean that patient was actually more awake.  Subsequently after second placement on ventilator patient began experiencing worsening of mentation -On Seroquel 50 mg HS-12/14 dose decreased to 25 mg and if tolerates without agitation can discontinue on 12/18 -Continue regular PT/OT/SLP -DC scalp staples 12/14  Obstructive hydrocephalus -Detected on MRI imaging 12/15 -Evaluated by neurosurgery on same date with plans for elective VP shunt potentially on 12/16.  Unfortunately unable to obtain a large slot until 12/20 therefore decision made to transfer patient to ICU for temporizing VP shunt  Acute on chronic respiratory failure (multifactorial)/tracheostomy tube dependent -Patient underwent combination trach placement and thyroidectomy on same date. -SLP states that thyroid wound communicates with trach wound which has resulted in delayed healing -Continues to have both extra and intra tracheal secretions-utilizing topical bacitracin and frequent wound care at trach site -See below regarding reemergence of new tracheobronchitis  Seizure like activity -On 12/14 patient had twitching on right side with altered mentation.  EEG consistent only with metabolic encephalopathy. -12/15 patient with more concerning seizure-like activity involving clonus of extremities as well as  gaze deviated to left.  Duration of 3 minutes.  Spontaneous termination of this activity and as of 12/16 no further seizure activity observed -Neurology consulted.  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  There is also been interval development of obstructive hydrocephalus which may be related to effacement of the fourth ventricle by the increased retrocerebellar fluid.    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   Weight as of this encounter: 64 kg.   Other problems: Hypertension -Newly diagnosed this admission; blood pressure stable on combination Norvasc, labetalol and hydralazine  Diabetes mellitus 2 -New diagnosis this admission with A1c 6.2 -CBGs stable on Lantus and SSI  Anemia of chronic illness -Hgb stable between 7.5 and 8.5  Wounds Incision (Closed) 01/31/20 Head (Active)  Date First Assessed/Time First Assessed: 01/31/20 0138   Location: Head    Assessments 01/31/2020  1:00 AM 03/13/2020  4:00 AM  Dressing Type - None  Site / Wound Assessment - Dry;Clean  Margins Attached edges (approximated) Attached edges (approximated)  Closure Staples -     No Linked orders to display     Incision (Closed)  02/08/20 Neck Other (Comment) (Active)  Date First Assessed/Time First Assessed: 02/08/20 1335   Location: Neck  Location Orientation: Other (Comment)    Assessments 02/10/2020 12:00 AM 03/13/2020  4:00 AM  Dressing Type Gauze (Comment) None  Dressing Changed -  Dressing Change Frequency PRN -  Site / Wound Assessment Clean Dry;Clean  Closure None None  Drainage Amount Minimal -  Drainage Description Serosanguineous -  Treatment Cleansed -     No Linked orders to display     Wound / Incision (Open or Dehisced) 02/08/20 Skin tear Back Left pink (Active)  Date First Assessed/Time First Assessed: 02/08/20 2000   Wound Type: Skin tear  Location: Back  Location Orientation: Left  Wound Description (Comments): pink    Assessments 02/08/2020  8:00 PM 03/13/2020  4:00 AM  Dressing Type Foam - Lift dressing to assess site every shift Foam - Lift dressing to assess site every shift  Dressing Status Clean;Dry;Intact -  Site / Wound Assessment Pink -  Peri-wound Assessment Intact -  Drainage Amount None -  Treatment Cleansed -     No Linked orders to display     Pressure Injury 02/12/20 Throat Mid Stage 2 -  Partial thickness loss of dermis presenting as a shallow open injury with a red, pink wound bed without slough. (Active)  Date First Assessed/Time First Assessed: 02/12/20 1500   Location: Throat  Location Orientation: Mid  Staging: Stage 2 -  Partial thickness loss of dermis presenting as a shallow open injury with a red,  pink wound bed without slough.  Present on Admis...    Assessments 02/12/2020  8:00 AM 03/13/2020  4:00 AM  Dressing Type Foam - Lift dressing to assess site every shift Gauze (Comment);Foam - Lift dressing to assess site every shift  Dressing - Changed  Dressing Change Frequency PRN -  Site / Wound Assessment Red;Painful;Other (Comment) -  Peri-wound Assessment Intact;Erythema (blanchable) -  Margins Unattached edges (unapproximated) -  Drainage Amount Minimal -   Drainage Description Serosanguineous -  Treatment Cleansed -     No Linked orders to display    Data Reviewed: Basic Metabolic Panel: Recent Labs  Lab 03/08/20 0021 03/12/20 0322  NA 136 137  K 4.0 4.0  CL 99 100  CO2 26 26  GLUCOSE 129* 145*  BUN 16 23  CREATININE 0.48 0.53  CALCIUM 9.3 9.5  MG  --  2.2  PHOS  --  3.9   Liver Function Tests: Recent Labs  Lab 03/12/20 0322  AST 15  ALT 17  ALKPHOS 81  BILITOT 0.4  PROT 6.6  ALBUMIN 3.0*   No results for input(s): LIPASE, AMYLASE in the last 168 hours. No results for input(s): AMMONIA in the last 168 hours. CBC: Recent Labs  Lab 03/08/20 0021 03/12/20 0322  WBC 5.9 6.9  HGB 9.5* 10.2*  HCT 30.9* 31.2*  MCV 86.1 84.8  PLT 149* 245   Cardiac Enzymes: No results for input(s): CKTOTAL, CKMB, CKMBINDEX, TROPONINI in the last 168 hours. BNP (last 3 results) No results for input(s): BNP in the last 8760 hours.  ProBNP (last 3 results) No results for input(s): PROBNP in the last 8760 hours.  CBG: Recent Labs  Lab 03/12/20 1541 03/12/20 1941 03/12/20 2345 03/13/20 0329 03/13/20 0752  GLUCAP 91 127* 105* 97 120*    Recent Results (from the past 240 hour(s))  Culture, respiratory (non-expectorated)     Status: None   Collection Time: 03/05/20 12:01 PM   Specimen: Tracheal Aspirate; Respiratory  Result Value Ref Range Status   Specimen Description TRACHEAL ASPIRATE  Final   Special Requests NONE  Final   Gram Stain   Final    RARE WBC PRESENT,BOTH PMN AND MONONUCLEAR MODERATE GRAM NEGATIVE RODS ABUNDANT GRAM POSITIVE COCCI Performed at [facility], [place] [address] [place], [place] [address]    Culture ABUNDANT STAPHYLOCOCCUS AUREUS  Final   Report Status 03/08/2020 FINAL  Final   Organism ID, Bacteria STAPHYLOCOCCUS AUREUS  Final      Susceptibility   Staphylococcus aureus - MIC*    CIPROFLOXACIN <=0.5 SENSITIVE Sensitive     ERYTHROMYCIN <=0.25 SENSITIVE Sensitive     GENTAMICIN <=0.5  SENSITIVE Sensitive     OXACILLIN 0.5 SENSITIVE Sensitive     TETRACYCLINE >=16 RESISTANT Resistant     VANCOMYCIN 1 SENSITIVE Sensitive     TRIMETH/SULFA <=10 SENSITIVE Sensitive     CLINDAMYCIN <=0.25 SENSITIVE Sensitive     RIFAMPIN <=0.5 SENSITIVE Sensitive     Inducible Clindamycin NEGATIVE Sensitive     * ABUNDANT STAPHYLOCOCCUS AUREUS     Studies: MR BRAIN WO CONTRAST  Result Date: 03/12/2020 CLINICAL DATA:  Seizure. Subarachnoid hemorrhage status post left PICA aneurysm clipping. EXAM: MRI HEAD WITHOUT CONTRAST TECHNIQUE: Multiplanar, multiecho pulse sequences of the brain and surrounding structures were obtained without intravenous contrast. COMPARISON:  CT head February 25, 2020. FINDINGS: Brain: Redemonstration of a right cerebral convexity fluid collection which has internal webs and measures up to 1.6 cm, likely increased from  prior when comparing across modalities. Leftward midline shift is also slightly increased, now measuring approximately 6 mm. 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 Bilateral retro cerebellar CSF collections, which appears slightly increased relative to prior. There is effacement fourth ventricle. Interval development of hydrocephalus. Additional scattered T2/FLAIR hyperintensities within the white matter, likely related to chronic microvascular ischemic disease. Vascular: Major arterial flow voids are maintained at the skull base. Skull and upper cervical spine: Prior left cerebellar calvarial craniotomy. Similar overlying fluid collection, suggestive of pseudomeningocele. Sinuses/Orbits: Sinuses are clear.  Left globe prosthesis. IMPRESSION: 1. 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 2. Redemonstration of a right cerebral convexity fluid collection which measures up to 1.6 cm, which is likely increased from prior  when comparing across modalities. Leftward midline shift is also slightly increased, now measuring approximately 6 mm. 3. Slight increase in retrocerebellar fluid in this patient with prior left cerebellar craniotomy with overlying pseudomeningocele/fluid collection. 4. Interval development of obstructive hydrocephalus, which may be related to effacement of the fourth ventricle by the aforementioned increased retrocerebellar fluid. Findings discussed with Dr. Hortense Ramal via telephone at 2:29 PM. Electronically Signed   By: Margaretha Sheffield MD   On: 03/12/2020 14:34   EEG adult  Result Date: 03/11/2020 Lora Havens, MD     03/11/2020  4:25 PM Patient Name: Lyna Laningham MRN: [id number] Epilepsy Attending: Lora Havens Referring Physician/Provider: Debbora Lacrosse, NP Date: 03/11/2020 Duration:  25.51 mins Patient history: 64 year old female with acute subarachnoid hemorrhage status post clipping of left PICA aneurysm and altered mental status.  Patient was also noted to have twitching of right arm and leg.  EEG to evaluate for seizures. Level of alertness:  lethargic AEDs during EEG study: None Technical aspects: This EEG study was done with scalp electrodes positioned according to the 10-20 International system of electrode placement. Electrical activity was acquired at a sampling rate of _0  and reviewed with a high frequency filter of _1  and a low frequency filter of _2 . EEG data were recorded continuously and digitally stored. Description: No posterior dominant rhythm was seen. EEG showed continuous generalized 3 to 6 Hz theta-delta slowing. Periodic discharges with triphasic morphology at 0._3  were noted. Hyperventilation and photic stimulation were not performed.   ABNORMALITY - Continuous slow, generalized - Periodic discharges with triphasic morphology, generalized IMPRESSION: This study is suggestive of moderate diffuse encephalopathy, nonspecific etiology.  Periodic discharges with triphasic  morphology can be on the ictal-interictal continuum but given the frequency and the morphology, these are more likely secondary to toxic-metabolic causes.  No seizures or definite epileptiform discharges were seen throughout the recording. If concern for ictal-interictal activity persists, please consider prolonged monitoring. Priyanka Barbra Sarks    Scheduled Meds: . amLODipine  10 mg Per Tube Daily  . chlorhexidine gluconate (MEDLINE KIT)  15 mL Mouth Rinse BID  . Chlorhexidine Gluconate Cloth  6 each Topical Daily  . docusate  100 mg Per Tube BID  . feeding supplement (PROSource TF)  45 mL Per Tube BID  . heparin  5,000 Units Subcutaneous Q8H  . hydrALAZINE  25 mg Per Tube Q8H  . insulin aspart  0-20 Units Subcutaneous Q4H  . labetalol  300 mg Per Tube TID  . levETIRAcetam  750 mg Per Tube BID  . levothyroxine  100 mcg Per Tube Q0600  . mouth rinse  15 mL Mouth Rinse 10 times  per day  . pantoprazole sodium  40 mg Per Tube Daily  . polyethylene glycol  17 g Per Tube Daily  . polyvinyl alcohol  1 drop Left Eye BID  . QUEtiapine  25 mg Per Tube QHS  . sodium chloride flush  10-40 mL Intracatheter Q12H   Continuous Infusions: .  ceFAZolin (ANCEF) IV 1 g (03/13/20 0552)  . feeding supplement (OSMOLITE 1.2 CAL) Stopped (03/13/20 0010)  . lactated ringers 10 mL/hr at 03/04/20 1710  . 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   H/O total thyroidectomy   Tracheostomy status ([place])   Acute respiratory failure ([place])   Ventilator dependence ([place])   PEG (percutaneous endoscopic gastrostomy) status ([place])   Hyperglycemia   Seizure-like activity [facility])   Consultants:  Neurosurgery  PCCM  ENT  Neurology  Procedures: 11/03 ETT >>11/09 11/03 EVD >> 11/09 ETT >> 11/12 11/12 Tracheostomy  >>   Antibiotics: Anti-infectives (From admission, onward)   Start     Dose/Rate Route Frequency Ordered Stop   03/10/20 1515  ceFAZolin (ANCEF) IVPB 1 g/50 mL premix        1 g 100 mL/hr over 30 Minutes Intravenous Every 8 hours 03/10/20 1422     02/22/20 1845  ceFAZolin (ANCEF) IVPB 2g/100 mL premix  Status:  Discontinued        2 g 200 mL/hr over 30 Minutes Intravenous  Once 02/22/20 1758 03/01/20 0817   02/22/20 1522  ceFAZolin (ANCEF) IVPB 2g/100 mL premix        over 30 Minutes  Continuous PRN 02/22/20 1523 02/22/20 1510   02/22/20 1514  ceFAZolin (ANCEF) 2-4 GM/100ML-% IVPB       Note to Pharmacy: Luis Abed   : cabinet override      02/22/20 1514 02/23/20 0329   02/19/20 1500  ceFEPIme (MAXIPIME) 2 g in sodium chloride 0.9 % 100 mL IVPB        2 g 200 mL/hr over 30 Minutes Intravenous Every 12 hours 02/19/20 1408 02/26/20 0836   02/10/20 1445  cefTRIAXone (ROCEPHIN) 2 g in sodium chloride 0.9 % 100 mL IVPB        2 g 200 mL/hr over 30 Minutes Intravenous Every 24 hours 02/10/20 1348 02/15/20 1624   02/09/20 1500  piperacillin-tazobactam (ZOSYN) IVPB 3.375 g  Status:  Discontinued        3.375 g 12.5 mL/hr over 240 Minutes Intravenous Every 8 hours 02/09/20 0755 02/10/20 1348   02/09/20 0845  piperacillin-tazobactam (ZOSYN) IVPB 3.375 g        3.375 g 100 mL/hr over 30 Minutes Intravenous  Once 02/09/20 0755 02/09/20 1027       Time spent: 35 minutes    Erin Hearing ANP  [facility] Pager [phone].

## 2020-03-13 NOTE — Progress Notes (Signed)
PHARMACY NOTE:  ANTIMICROBIAL RENAL DOSAGE ADJUSTMENT  Current antimicrobial regimen includes a mismatch between antimicrobial dosage and estimated renal function.  As per policy approved by the Pharmacy & Therapeutics and Medical Executive Committees, the antimicrobial dosage will be adjusted accordingly.  Current antimicrobial dosage:  Cefazolin 1g IV every 8 hours  Indication: MSSA tracheobronchitis  Renal Function:  Estimated Creatinine Clearance: 73.7 mL/min (by C-G formula based on SCr of 0.71 mg/dL). []      On intermittent HD, scheduled: []      On CRRT    Antimicrobial dosage has been changed to:  Cefazolin 2g IV every 8 hours  Additional comments:   Thank you for allowing pharmacy to be a part of this patient's care.  Alycia Rossetti, PharmD, BCPS  12:59 PM

## 2020-03-13 NOTE — Progress Notes (Signed)
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  Required tracheostomy with prolonged hospital course.    Past Medical History  Transferred to ICU from general floor overnight due to bradycardia. Improved with trachel suctioning of copious secretions. Bronchoscopy showing copious secretions but other etiology for blockage  Significant Hospital Events   11/03 Admitted 11/04 craniotomy with clipping of left PICA 11/12 total thyroidectomy, tracheostomy 11/13 start ABx for fever and tracheobronchitis 11/16 Now 11 days post clipping.  Drain stopped yesterday so left clamped.  Was interactive with PT yesterday. This morning, agitated and complaining of back pain. More somnolent following fentanyl this morning. Stable from neuro check  11/21 pulm called back again;  trach bleeding. we called ENT 11/23 subglottic obstruction which is new observed on MBS-->CT neck ordered to r/o infection. Also abx started. As she failed MBS IR consulted for PEG. Should be last day for nimodipine. We asked IM to assist w. Care  12/4:  Put on mechanical ventilation and transferred to ICU 12/6 tolerating PS 12/15 seizure prompting MRI showing worsening hydrocephalus 12/16 transferred to ICU for bedside VP shunt placement  Consults:  ENT (Dr.Rosen) Neuro surgery  Procedures:  11/03 ETT >>11/09 11/03 EVD >> 11/09 ETT >> 11/12 11/12 Tracheostomy >>  Significant Diagnostic Tests:  11/3 CT head > large volume subarachnoid related to ruptured left PICA aneurysm, intraventricular reflux with hydrocephalus, chronic small vessel disease, chronic small vessel disease 11/9 H&N U/S > 2.3x2.3x2.4 mass in the left neck along the left thyroid lobe 11/27 CT head > Resolution of  subarachnoid hemorrhage, stable subdural hematoma 11/29 CT head > Unchanged size of right subdural hematoma with minimal leftward midline shift 12/15 MRI brain > Multiple small infarcts scattered throughout the right frontal and parietal lobes and involving the corpus callosum. Redemonstration of a right cerebral convexity fluid collection which measures up to 1.6 cm, which is likely increased from prior when comparing across modalities. Leftward midline shift is also slightly increased, now measuring approximately 6 mm. Interval development of obstructive hydrocephalus  Micro Data:  11/03 SARS2/ Flu > neg 11/08 respiratory culture >> negative 11/08 blood culture >>negative 11/11 urine culture >> multiple species 11/12 MRSA PCR >> negative 11/12 sputum: staph aureus, Citrobacter and Haemophilus parainfluenza (B lactamase +)  11/22 sputum >> multiple organism, no staph or strep 12/4 Sputum>> negative and gram-positive rods 12/4 wound trach site > neg 12/8 sputum > MSSA  Antimicrobials:  Zosyn 11/13 >> 11/14 Ceftriaxone 11/14 - 11/20 Ancef 12/13 >  Interim history/subjective:  Had been doing well on the floor but developed sz activity on 12/15. Loaded with Keppra by neurology. MRI done demonstrating multiple small infarcts and interval development of hydrocephalus. She was moved to ICU 12/16 for closer monitoring and placement of VP shunt.   Objective   Blood pressure 140/85, pulse 90, temperature 98.3 F (36.8 C), temperature source Axillary, resp. rate (!) 28, height 5\' 9"  (1.753 m), weight 64 kg, SpO2 100 %.    FiO2 (%):  [21 %] 21 %   Intake/Output Summary (Last 24 hours) at 03/13/2020 1039 Last data filed at 03/13/2020 0334 Gross per 24 hour  Intake 8726 ml  Output 1200 ml  Net 7526 ml   Filed Weights   03/06/20 0500 03/10/20 0500  03/11/20 0500  Weight: 59.7 kg 63.6 kg 64 kg    Examination: Constitutional: frail chronically ill appearing female.  HEENT: trach in place,  site CDI. Moderate secretions.  Cardiovascular: RRR no MRG Respiratory: Bilateral rhonchi Gastrointestinal: Soft, +BS, PEG in place. Site looks good.  Skin: grossly intact.  Neurologic: minimal spontaneous movement of extremities. Not to commands.   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 Kathyrn Sheriff  Seizure: due to above - Keppra - Epileptology following.  - LTM EEG if further seizures - PRN ativan - STAT labs  Acute on chronic respiratory failure due to inability to handle secretions after ruptured PICA aneurysm. - Tolerating room air via trach collar - Routine trach care - Usual suctioning and pulmonary toileting as ordered - Not a candidate for decannulation for foreseaable future  Acute encephalopathy - VP shunt pending - Seroquel QHS, on hold due to acute somnolence. Restart as MS hopefully improves after shunt placed.   MSSA tracheobronchitis - Ancef 7 days will complete 12/19  Goiter s/p thyroidectomy and tracheostomy - supportive care - synthroid  DM2 - CBG monitoring and SSI - Lantus - TF  HTN - amlodipine, hydralazine, labetalol   Georgann Housekeeper, AGACNP-BC [place] for personal pager PCCM on call pager [phone]  03/13/2020 10:56 AM

## 2020-03-14 DIAGNOSIS — J9600 Acute respiratory failure, unspecified whether with hypoxia or hypercapnia: Secondary | ICD-10-CM

## 2020-03-14 DIAGNOSIS — G911 Obstructive hydrocephalus: Secondary | ICD-10-CM

## 2020-03-14 LAB — MAGNESIUM: Magnesium: 2.5 mg/dL — ABNORMAL HIGH (ref 1.7–2.4)

## 2020-03-14 LAB — CBC
HCT: 33.3 % — ABNORMAL LOW (ref 36.0–46.0)
Hemoglobin: 10.2 g/dL — ABNORMAL LOW (ref 12.0–15.0)
MCH: 26.8 pg (ref 26.0–34.0)
MCHC: 30.6 g/dL (ref 30.0–36.0)
MCV: 87.4 fL (ref 80.0–100.0)
Platelets: 270 10*3/uL (ref 150–400)
RBC: 3.81 MIL/uL — ABNORMAL LOW (ref 3.87–5.11)
RDW: 19.8 % — ABNORMAL HIGH (ref 11.5–15.5)
WBC: 6.7 10*3/uL (ref 4.0–10.5)
nRBC: 0 % (ref 0.0–0.2)

## 2020-03-14 LAB — BASIC METABOLIC PANEL
Anion gap: 16 — ABNORMAL HIGH (ref 5–15)
BUN: 21 mg/dL (ref 8–23)
CO2: 20 mmol/L — ABNORMAL LOW (ref 22–32)
Calcium: 9.1 mg/dL (ref 8.9–10.3)
Chloride: 99 mmol/L (ref 98–111)
Creatinine, Ser: 0.46 mg/dL (ref 0.44–1.00)
GFR, Estimated: >60 mL/min (ref >60)
Glucose, Bld: 140 mg/dL — ABNORMAL HIGH (ref 70–99)
Potassium: 6 mmol/L — ABNORMAL HIGH (ref 3.5–5.1)
Sodium: 135 mmol/L (ref 135–145)

## 2020-03-14 LAB — POTASSIUM: Potassium: 4.4 mmol/L (ref 3.5–5.1)

## 2020-03-14 LAB — GLUCOSE, CAPILLARY
Glucose-Capillary: 132 mg/dL — ABNORMAL HIGH (ref 70–99)
Glucose-Capillary: 85 mg/dL (ref 70–99)
Glucose-Capillary: 207 mg/dL — ABNORMAL HIGH (ref 70–99)
Glucose-Capillary: 116 mg/dL — ABNORMAL HIGH (ref 70–99)
Glucose-Capillary: 165 mg/dL — ABNORMAL HIGH (ref 70–99)

## 2020-03-14 LAB — PHOSPHORUS: Phosphorus: 4 mg/dL (ref 2.5–4.6)

## 2020-03-14 MED ORDER — QUETIAPINE FUMARATE 25 MG PO TABS
25.0000 mg | ORAL_TABLET | Freq: Every day | ORAL | Status: DC
  Administered 2020-03-14 – 2020-03-22 (×9): 25 mg
  Filled 2020-03-14 (×9): qty 1

## 2020-03-14 MED ORDER — HYDRALAZINE HCL 20 MG/ML IJ SOLN
20.0000 mg | Freq: Four times a day (QID) | INTRAMUSCULAR | Status: DC | PRN
  Administered 2020-03-14 – 2020-03-16 (×2): 20 mg via INTRAVENOUS
  Administered 2020-03-18 (×3): 5 mg via INTRAVENOUS
  Administered 2020-03-23 – 2020-04-14 (×2): 20 mg via INTRAVENOUS
  Filled 2020-03-14 (×6): qty 1

## 2020-03-14 MED ORDER — HYDRALAZINE HCL 50 MG PO TABS
50.0000 mg | ORAL_TABLET | Freq: Three times a day (TID) | ORAL | Status: DC
  Administered 2020-03-14 – 2020-04-22 (×115): 50 mg
  Filled 2020-03-14 (×116): qty 1

## 2020-03-14 MED ORDER — HYDRALAZINE HCL 20 MG/ML IJ SOLN
INTRAMUSCULAR | Status: AC
  Filled 2020-03-14: qty 1

## 2020-03-14 MED ORDER — LABETALOL HCL 5 MG/ML IV SOLN
10.0000 mg | INTRAVENOUS | Status: DC | PRN
  Administered 2020-03-18 – 2020-03-21 (×3): 20 mg via INTRAVENOUS
  Filled 2020-03-14 (×3): qty 4

## 2020-03-14 NOTE — Progress Notes (Signed)
Occupational Therapy Treatment Patient Details Name: Stacy Moran MRN: [id number] DOB: [birth year] Today's Date: 03/14/2020    History of present illness Pt is a 64 y/o female with no known PHX admitted with sudden onset of HA, BP 262/123, Imaging showing large volume SAH due to a ruptured L PICA aneurysm associated with intraventricular hydrocephalus.  11/4, pt s/p L far-lateral craniotomy for clipping of PICA aneurysm. 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 12/4 pt with desat and bradycardia and was transferred to ICU and placed on the vent. Pt off vent 12/6. 12/15 seizure prompting MRI showing worsening hydrocephalus. 12/16 transferred to ICU for bedside ventriculostomy. Planned for VP shunt placement on 12/20.   OT comments  This 64 yo female seen today to work on sitting balance, following commands, sit<>stand, and lateral transfers. Pt needing +2 for all mobility and in sitting has a tendency to lean to left with head rotation that direction as well. She can correct with Max verbal, tactile, and gestural cues combined. No active movement noted of left eye (it remains fixed superior/left). Pt communicating with mouthing yes/no or head nods with 75% accuracy, not oriented to place. She will continue to benefit from acute OT with follow up at SNF. Goals revised this session.  Follow Up Recommendations  SNF;Supervision/Assistance - 24 hour    Equipment Recommendations  Wheelchair (measurements OT);Wheelchair cushion (measurements OT);Hospital bed       Precautions / Restrictions Precautions Precautions: Fall Precaution Comments: trach, wrist restraint RUE, posey belt, bil mittens, PEG, EVD Restrictions Weight Bearing Restrictions: No       Mobility Bed Mobility Overal bed mobility: Needs Assistance Bed Mobility: Supine to Sit;Sit to Supine     Supine to sit: +2 for physical assistance;HOB elevated;Max assist;+2 for  safety/equipment Sit to supine: +2 for physical assistance;+2 for safety/equipment;Max assist   General bed mobility comments: assist for all aspects of mobility, use of bed pad to scoot to EOB  Transfers Overall transfer level: Needs assistance Equipment used: 2 person hand held assist Transfers: Sit to/from Stand   Stand pivot transfers: +2 physical assistance;+2 safety/equipment;Max assist      Lateral/Scoot Transfers: +2 physical assistance;Total assist;+2 safety/equipment General transfer comment: Sit to stand x 4 trials from EOB. Lateral scoot transfer along EOB for better positioning prior to return to supine.    Balance Overall balance assessment: Needs assistance Sitting-balance support: Feet supported;Bilateral upper extremity supported Sitting balance-Leahy Scale: Poor Sitting balance - Comments: Max assist to maintain sitting balance EOB x 10 minutes. Postural control: Right lateral lean Standing balance support: Bilateral upper extremity supported;During functional activity Standing balance-Leahy Scale: Zero Standing balance comment: MaxA + 2 sit<>stand                           ADL either performed or assessed with clinical judgement   ADL Overall ADL's : Needs assistance/impaired                         Toilet Transfer: Maximal assistance;Total assistance;+2 for physical assistance Toilet Transfer Details (indicate cue type and reason): Max A +2 sit<>stand with total A +2 to shift laterally                 Vision   Additional Comments: left eye is up and to the left. When right eye is covered pt reports with head nods and mouthing that she cannot see  anything out of left eye.          Cognition Arousal/Alertness: Awake/alert Behavior During Therapy: WFL for tasks assessed/performed Overall Cognitive Status: Impaired/Different from baseline Area of Impairment: Orientation;Attention;Following  commands;Safety/judgement;Awareness;Problem solving                 Orientation Level: Disoriented to;Time;Situation;Place Current Attention Level: Focused   Following Commands: Follows one step commands inconsistently;Follows one step commands with increased time Safety/Judgement: Decreased awareness of safety;Decreased awareness of deficits Awareness: Intellectual Problem Solving: Slow processing;Decreased initiation;Difficulty sequencing;Requires verbal cues;Requires tactile cues General Comments: Difficult to fully assess cognition due to tracheostomy. Pt alert with good participation during today's session.                   Pertinent Vitals/ Pain       Pain Assessment: Faces Faces Pain Scale: Hurts a little bit Pain Location: intermittent grimacing noted with positioning and movement Pain Descriptors / Indicators: Discomfort;Grimacing Pain Intervention(s): Limited activity within patient's tolerance;Monitored during session;Repositioned         Frequency  Min 2X/week        Progress Toward Goals  OT Goals(current goals can now be found in the care plan section)  Progress towards OT goals: Not progressing toward goals - comment (has had a set back of needing EVD since last seen)  Acute Rehab OT Goals Patient Stated Goal: shook head yes to wanting to get up OT Goal Formulation: With patient Time For Goal Achievement: 03/28/20 Potential to Achieve Goals: Good  Plan Discharge plan remains appropriate    Co-evaluation    PT/OT/SLP Co-Evaluation/Treatment: Yes Reason for Co-Treatment: Complexity of the patient's impairments (multi-system involvement);For patient/therapist safety;To address functional/ADL transfers PT goals addressed during session: Mobility/safety with mobility;Balance;Strengthening/ROM OT goals addressed during session: ADL's and self-care;Strengthening/ROM      AM-PAC OT "6 Clicks" Daily Activity     Outcome Measure   Help from  another person eating meals?: Total Help from another person taking care of personal grooming?: Total Help from another person toileting, which includes using toliet, bedpan, or urinal?: Total Help from another person bathing (including washing, rinsing, drying)?: Total Help from another person to put on and taking off regular upper body clothing?: Total Help from another person to put on and taking off regular lower body clothing?: Total 6 Click Score: 6    End of Session Equipment Utilized During Treatment: Oxygen  OT Visit Diagnosis: Unsteadiness on feet (R26.81);Muscle weakness (generalized) (M62.81);Other abnormalities of gait and mobility (R26.89) Hemiplegia - Right/Left: Right Hemiplegia - dominant/non-dominant: Dominant Hemiplegia - caused by: Nontraumatic intracerebral hemorrhage   Activity Tolerance Patient tolerated treatment well   Patient Left in bed;with call bell/phone within reach   Nurse Communication Mobility status (all restraints back on (posey, right wrist and Bil mits)--bed alarm would not set)        Time: 1010-1035 OT Time Calculation (min): 25 min  Charges: OT General Charges $OT Visit: 1 Visit OT Treatments $Self Care/Home Management : 8-22 mins  [address], OTR/L Acute [employer] Pager [phone] Office [phone]      Almon Register 03/14/2020, 11:49 AM

## 2020-03-14 NOTE — Progress Notes (Signed)
Nutrition Follow-up  DOCUMENTATION CODES:   Not applicable  INTERVENTION:  Continue tube feeds via PEG: - Osmolite 1.2 @ 60 ml/hr (1440 ml/day) - ProSource TF 45 ml BID  Tube feeding regimen provides 1808 kcal, 102 grams of protein, and 1181 ml of H2O.   NUTRITION DIAGNOSIS:   Inadequate oral intake related to acute illness as evidenced by estimated needs.  ongoing  GOAL:   Patient will meet greater than or equal to 90% of their needs  Met with TF  MONITOR:   Vent status,Labs,Weight trends,Skin,I & O's  REASON FOR ASSESSMENT:   Ventilator    ASSESSMENT:   64 year old female who presented to the ED on 11/03 with headache. 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 Pt required intubation for airway protection.  Pt discussed during ICU rounds and with RN.  Plan for shunt placement 12/20   11/03 - admitted 11/04 [facility], L PICA aneurysm to OR for craniotomy, clipping  11/09 - extubated but immediately re-intubated 11/12 - s/p thyroidectomy with tracheostomy; cortrak placed (tip gastric) 11/16 - EVD removed 11/17 - trach exchange to facilitate PMV 11/20 - Cortrak removed by pt 11/22 - trach change (pt self-decannulated) 11/26 - PEG placed 12/01 - MBS with recommendation for NPO 12/03 - s/p direct laryngoscopy 12/04 - transferred to ICU, put on mechanical ventilation 12/07 - tolerating TCT; transferred to 3W 12/16 - MRI shows increased hydrocephalus; tx ICU for EVD placement.   Admit weight: 66.9 kg Current weight: 64 kg    UOP: 1154m x24 hours EVD: 301 ml   Labs: K+ 6 CBGs 144-132-85 Medications: colace, ss novolog Q4H, miralax IVF: LR @ 180mhr  Diet Order:   Diet Order    None      EDUCATION NEEDS:   No education needs have been identified at this time  Skin:  Skin Assessment: Skin Integrity Issues: Skin Integrity Issues:: Stage II,Other (Comment),Incisions Stage II: throat Incisions:  neck, head Other: skin tear back  Last BM:  03/09/20  Height:   Ht Readings from Last 1 Encounters:  03/04/20 _0  (1.753 m)    Weight:   Wt Readings from Last 1 Encounters:  03/11/20 64 kg    Ideal Body Weight:  65.9 kg  BMI:  Body mass index is 20.84 kg/m.  Estimated Nutritional Needs:   Kcal:  1700-1900  Protein:  95-110 grams  Fluid:  > 1.7 L/day  HeLockie Pares RD, LDN, CNSC See AMiON for contact information

## 2020-03-14 NOTE — Progress Notes (Addendum)
-   note created in error

## 2020-03-14 NOTE — Progress Notes (Signed)
  NEUROSURGERY PROGRESS NOTE   No issues overnight. Sitting on side of bed  EXAM:  BP 126/83   Pulse 75   Temp 97.8 F (36.6 C) (Axillary)   Resp 17   Ht 5\' 9"  (1.753 m)   Wt 64 kg   SpO2 97%   BMI 20.84 kg/m   Awake, alert Mouths responses although confused 83mm reactive OD, prosthetic OS Follows commands with all extremities EVD in place, draining clear CSF   IMPRESSION/PLAN 64 y.o. female s/p SAH with delayed onset HCP. 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 Much improved neurologically this am - continue supportive care, EVD - Plan for VP shunt and evacuation of SDH Monday

## 2020-03-14 NOTE — Progress Notes (Addendum)
NAME:  Stacy Moran, MRN:  [id number], DOB:  1955-12-15, LOS: 20 ADMISSION DATE:  01/30/2020, CONSULTATION DATE:  01/30/20 REFERRING MD:  Ralene Ok, CHIEF COMPLAINT:  Headache   Brief History   64 y/o presented with large subarachnoid hemorrhage due to ruptured PICA with resultant hydrocephalus.  Required tracheostomy with prolonged hospital course.    Past Medical History  Transferred to ICU from general floor overnight due to bradycardia. Improved with trachel suctioning of copious secretions. Bronchoscopy showing copious secretions but other etiology for blockage  Significant Hospital Events   11/03 Admitted 11/04 craniotomy with clipping of left PICA 11/12 total thyroidectomy, tracheostomy 11/13 start ABx for fever and tracheobronchitis 11/16 Now 11 days post clipping.  Drain stopped yesterday so left clamped.  Was interactive with PT yesterday. This morning, agitated and complaining of back pain. More somnolent following fentanyl this morning. Stable from neuro check  11/21 pulm called back again;  trach bleeding. we called ENT 11/23 subglottic obstruction which is new observed on MBS-->CT neck ordered to r/o infection. Also abx started. As she failed MBS IR consulted for PEG. Should be last day for nimodipine. We asked IM to assist w. Care  12/4:  Put on mechanical ventilation and transferred to ICU 12/6 tolerating PS 12/15 seizure prompting MRI showing worsening hydrocephalus 12/16 transferred to ICU for bedside ventriculostomy  Consults:  ENT (Dr.Rosen) Neuro surgery  Procedures:  11/03 ETT >>11/09 11/03 EVD >> 11/09 ETT >> 11/12 11/12 Tracheostomy >> 12/16 Ventriculostomy >>  Significant Diagnostic Tests:  11/3 CT head > large volume subarachnoid related to ruptured left PICA aneurysm, intraventricular reflux with hydrocephalus, chronic small vessel disease, chronic small vessel disease 11/9 H&N U/S > 2.3x2.3x2.4 mass in the left neck along the left thyroid lobe 11/27 CT  head > Resolution of subarachnoid hemorrhage, stable subdural hematoma 11/29 CT head > Unchanged size of right subdural hematoma with minimal leftward midline shift 12/15 MRI brain > Multiple small infarcts scattered throughout the right frontal and parietal lobes and involving the corpus callosum. Redemonstration of a right cerebral convexity fluid collection which measures up to 1.6 cm, which is likely increased from prior when comparing across modalities. Leftward midline shift is also slightly increased, now measuring approximately 6 mm. Interval development of obstructive hydrocephalus  Micro Data:  11/03 SARS2/ Flu > neg 11/08 respiratory culture >> negative 11/08 blood culture >>negative 11/11 urine culture >> multiple species 11/12 MRSA PCR >> negative 11/12 sputum: staph aureus, Citrobacter and Haemophilus parainfluenza (B lactamase +)  11/22 sputum >> multiple organism, no staph or strep 12/4 Sputum>> negative and gram-positive rods 12/4 wound trach site > neg 12/8 sputum > MSSA  Antimicrobials:  Zosyn 11/13 >> 11/14 Ceftriaxone 11/14 - 11/20 Ancef 12/13 >  Interim history/subjective:  N/A  Stacy Moran was examined and evaluated at bedside. Noted to be resting comfortably in bed. Is able to mouth words appropriately and follow directions. Informed her of her transfer to the ICU. Currently denies any acute distress or pain.  Objective   Blood pressure (!) 170/92, pulse 76, temperature 97.7 F (36.5 C), temperature source Axillary, resp. rate 18, height 5\' 9"  (1.753 m), weight 64 kg, SpO2 98 %.    FiO2 (%):  [21 %-28 %] 21 %   Intake/Output Summary (Last 24 hours) at 03/14/2020 0828 Last data filed at 03/14/2020 0800 Gross per 24 hour  Intake 1112.12 ml  Output 1431 ml  Net -318.88 ml   Filed Weights   03/06/20 0500 03/10/20 0500 03/11/20 0500  Weight: 59.7 kg 63.6 kg 64 kg    Examination:  Gen: Well-developed, chronically ill-appearing HEENT: NCAT head, hearing  intact, divergent strabismus Neck: trach collar in place and functioning, mucus production noted CV: RRR, S1, S2 normal, No rubs, no murmurs, no gallops Pulm: Transmitted upper airway noises, no rales, no wheezes  Abd: Soft, BS+, NTND, No rebound, no guarding Extm: ROM intact, Peripheral pulses intact, No peripheral edema Skin: Dry, Warm, normal turgor, no rashes, lesions, wounds.  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 O2 sat stable at 98% this am at 4L - Tolerating room air via trach collar - Routine trach care - Usual suctioning and pulmonary toileting as ordered  Acute encephalopathy - Resolving after shunt placement  MSSA tracheobronchitis - Ancef 7 days will complete 12/19  Goiter s/p thyroidectomy and tracheostomy - supportive care - synthroid  DM2 - CBG monitoring and SSI - Lantus - TF  HTN - amlodipine, hydralazine, labetalol  Stacy Anis, MD 03/14/2020, 8:37 AM PGY-3, [facility] Pager: [phone]

## 2020-03-14 NOTE — Progress Notes (Signed)
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  11/4, pt s/p L far-lateral craniotomy for clipping of PICA aneurysm. 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 12/4 pt with desat and bradycardia and was transferred to ICU and placed on the vent. Pt off vent 12/6. 12/15 seizure prompting MRI showing worsening hydrocephalus. 12/16 transferred to ICU for bedside ventriculostomy. Planned for VP shunt placement on 12/20.    PT Comments    Pt more alert today following EVD placement. Good participation in therapy. She required +2 max assist bed mobility, max assist to maintain sitting balance EOB, and +2 max assist sit to stand. Pt demonstrates R lateral lean and difficulty finding/maintaining midline. Unable to progress LEs for sidestepping/gait, static stand only. Pt returned to bed at end of session. Goals reviewed and downgraded due to change in med status.    Follow Up Recommendations  SNF;Supervision/Assistance - 24 hour     Equipment Recommendations  Wheelchair (measurements PT);Wheelchair cushion (measurements PT);3in1 (PT)    Recommendations for Other Services       Precautions / Restrictions Precautions Precautions: Fall Precaution Comments: trach, wrist restraint RUE, posey belt, bil mittens, PEG, EVD Restrictions Weight Bearing Restrictions: No    Mobility  Bed Mobility Overal bed mobility: Needs Assistance Bed Mobility: Supine to Sit;Sit to Supine     Supine to sit: +2 for physical assistance;HOB elevated;Max assist;+2 for safety/equipment Sit to supine: +2 for physical assistance;+2 for safety/equipment;Max  assist   General bed mobility comments: assist for all aspects of mobility, use of bed pad to scoot to EOB  Transfers Overall transfer level: Needs assistance Equipment used: 2 person hand held assist Transfers: Sit to/from Stand   Stand pivot transfers: +2 physical assistance;+2 safety/equipment;Max assist      Lateral/Scoot Transfers: +2 physical assistance;Total assist;+2 safety/equipment General transfer comment: Sit to stand x 4 trials from EOB. Lateral scoot transfer along EOB for better positioning prior to return to supine.  Ambulation/Gait                 Stairs             Wheelchair Mobility    Modified Rankin (Stroke Patients Only) Modified Rankin (Stroke Patients Only) Pre-Morbid Rankin Score: No symptoms Modified Rankin: Severe disability     Balance Overall balance assessment: Needs assistance Sitting-balance support: Feet supported;Bilateral upper extremity supported Sitting balance-Leahy Scale: Poor Sitting balance - Comments: Max assist to maintain sitting balance EOB x 10 minutes. Postural control: Right lateral lean Standing balance support: Bilateral upper extremity supported;During functional activity Standing balance-Leahy Scale: Zero Standing balance comment: MaxA + 2 sit<>stand                            Cognition Arousal/Alertness: Awake/alert Behavior During Therapy: WFL for tasks assessed/performed Overall Cognitive Status: Impaired/Different from baseline Area of Impairment: Orientation;Attention;Following commands;Safety/judgement;Awareness;Problem solving                 Orientation Level: Disoriented to;Time;Situation;Place Current Attention Level: Focused   Following Commands: Follows one step commands inconsistently;Follows one step commands with increased time Safety/Judgement: Decreased awareness of safety;Decreased awareness of deficits Awareness:  Intellectual Problem Solving: Slow  processing;Decreased initiation;Difficulty sequencing;Requires verbal cues;Requires tactile cues General Comments: Difficult to fully assess cognition due to tracheostomy. Pt alert with good participation during today's session.      Exercises      General Comments        Pertinent Vitals/Pain Pain Assessment: Faces Faces Pain Scale: Hurts a little bit Pain Location: intermittent grimacing noted with positioning and movement Pain Descriptors / Indicators: Discomfort;Grimacing Pain Intervention(s): Limited activity within patient's tolerance;Monitored during session;Repositioned    Home Living                      Prior Function            PT Goals (current goals can now be found in the care plan section) Acute Rehab PT Goals Patient Stated Goal: not stated PT Goal Formulation: With patient Time For Goal Achievement: 03/28/20 Potential to Achieve Goals: Good Progress towards PT goals: Goals downgraded-see care plan    Frequency    Min 2X/week      PT Plan Current plan remains appropriate    Co-evaluation PT/OT/SLP Co-Evaluation/Treatment: Yes Reason for Co-Treatment: Complexity of the patient's impairments (multi-system involvement);For patient/therapist safety;To address functional/ADL transfers PT goals addressed during session: Mobility/safety with mobility;Balance;Strengthening/ROM OT goals addressed during session: ADL's and self-care;Strengthening/ROM      AM-PAC PT "6 Clicks" Mobility   Outcome Measure  Help needed turning from your back to your side while in a flat bed without using bedrails?: A Lot Help needed moving from lying on your back to sitting on the side of a flat bed without using bedrails?: A Lot Help needed moving to and from a bed to a chair (including a wheelchair)?: Total Help needed standing up from a chair using your arms (e.g., wheelchair or bedside chair)?: A Lot Help needed to walk in hospital room?: Total Help needed  climbing 3-5 steps with a railing? : Total 6 Click Score: 9    End of Session Equipment Utilized During Treatment: Gait belt;Oxygen Activity Tolerance: Patient tolerated treatment well Patient left: in bed;with call bell/phone within reach;with restraints reapplied Nurse Communication: Mobility status PT Visit Diagnosis: Hemiplegia and hemiparesis;Other symptoms and signs involving the nervous system (R29.898);Other abnormalities of gait and mobility (R26.89);Unsteadiness on feet (R26.81);Muscle weakness (generalized) (M62.81);Difficulty in walking, not elsewhere classified (R26.2) Hemiplegia - Right/Left: Left Hemiplegia - dominant/non-dominant: Non-dominant Hemiplegia - caused by: Other Nontraumatic intracranial hemorrhage     Time: 8325-4982 PT Time Calculation (min) (ACUTE ONLY): 29 min  Charges:  $Therapeutic Activity: 8-22 mins                     Lorrin Goodell, PT  Office # [phone] Pager [phone]    [name] 03/14/2020, 11:46 AM

## 2020-03-14 NOTE — Progress Notes (Signed)
NEURO HOSPITALIST PROGRESS NOTE   Subjective: Patient in bed, EVD draining clear fluid. Patient trached. Patient  Somewhat sleepy when examined, but able to follow commands. Patient in bilateral mitten restraints. Exam: Vitals:   03/14/20 1000 03/14/20 1100  BP: (!) 152/81 (!) 155/78  Pulse: 72 74  Resp: (!) 21 14  Temp:    SpO2: 97% 98%    Physical Exam  Constitutional: Appears well-developed and well-nourished.  Psych: Affect appropriate to situation Eyes: Normal external eye and conjunctiva. HENT: Normocephalic, no lesions, without obvious abnormality.   Musculoskeletal-no joint tenderness, deformity or swelling Cardiovascular: Normal rate and regular rhythm.  Respiratory: Effort normal, non-labored breathing saturations WNL GI: Soft.  No distension. There is no tenderness.  Skin: WDI   Neuro:  Mental Status: Asleep. Opened eyes to touch. Able to follow some simple commands. No verbalizations noted.  Cranial Nerves: Right pupil round and reactive to light. Left eye prosthetic. Ptosis not present.  Motor/sensory: BUE: able to move command. Left weaker than right.BLE able to move both legs spontaneously and to command. Left side moves less than right.  Tone and bulk:normal tone throughout; no atrophy noted Responds to noxious all 4. Cerebellar: Unable to assess Gait: deferred    Medications:  Scheduled: . amLODipine  10 mg Per Tube Daily  . chlorhexidine gluconate (MEDLINE KIT)  15 mL Mouth Rinse BID  . Chlorhexidine Gluconate Cloth  6 each Topical Daily  . docusate  100 mg Per Tube BID  . feeding supplement (PROSource TF)  45 mL Per Tube BID  . heparin  5,000 Units Subcutaneous Q8H  . hydrALAZINE  50 mg Per Tube Q8H  . insulin aspart  0-20 Units Subcutaneous Q4H  . labetalol  300 mg Per Tube TID  . levETIRAcetam  750 mg Per Tube BID  . levothyroxine  100 mcg Per Tube Q0600  . mouth rinse  15 mL Mouth Rinse 10 times per day  .  pantoprazole sodium  40 mg Per Tube Daily  . polyethylene glycol  17 g Per Tube Daily  . polyvinyl alcohol  1 drop Left Eye BID  . QUEtiapine  25 mg Per Tube QHS  . sodium chloride flush  10-40 mL Intracatheter Q12H   Continuous: .  ceFAZolin (ANCEF) IV Stopped (03/14/20 4315)  . feeding supplement (OSMOLITE 1.2 CAL) 60 mL/hr at 03/13/20 2000  . lactated ringers 10 mL/hr at 03/14/20 0700   QMG:[id number] **OR** acetaminophen (TYLENOL) oral liquid 160 mg/5 mL **OR** acetaminophen, bisacodyl, guaiFENesin, hydrALAZINE, hydrocortisone cream, labetalol, ondansetron **OR** ondansetron (ZOFRAN) IV, sodium chloride flush  Pertinent Labs/Diagnostics:   MR BRAIN WO CONTRAST  Result Date: 03/12/2020 CLINICAL DATA:  Seizure. Subarachnoid hemorrhage status post left PICA aneurysm clipping. EXAM: MRI HEAD WITHOUT CONTRAST TECHNIQUE: Multiplanar, multiecho pulse sequences of the brain and surrounding structures were obtained without intravenous contrast. COMPARISON:  CT head February 25, 2020. FINDINGS: Brain: Redemonstration of a right cerebral convexity fluid collection which has internal webs and measures up to 1.6 cm, likely increased from prior when comparing across modalities. Leftward midline shift is also slightly increased, now measuring approximately 6 mm. 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 Bilateral retro cerebellar CSF collections, which appears slightly increased relative to prior. There is effacement fourth ventricle. Interval development of hydrocephalus. Additional scattered T2/FLAIR hyperintensities within the white matter, likely  related to chronic microvascular ischemic disease. Vascular: Major arterial flow voids are maintained at the skull base. Skull and upper cervical spine: Prior left cerebellar calvarial craniotomy. Similar overlying fluid collection, suggestive of pseudomeningocele. Sinuses/Orbits:  Sinuses are clear.  Left globe prosthesis. IMPRESSION: 1. 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 2. Redemonstration of a right cerebral convexity fluid collection which measures up to 1.6 cm, which is likely increased from prior when comparing across modalities. Leftward midline shift is also slightly increased, now measuring approximately 6 mm. 3. Slight increase in retrocerebellar fluid in this patient with prior left cerebellar craniotomy with overlying pseudomeningocele/fluid collection. 4. Interval development of obstructive hydrocephalus, which may be related to effacement of the fourth ventricle by the aforementioned increased retrocerebellar fluid. Findings discussed with Dr. Hortense Ramal via telephone at 2:29 PM. Electronically Signed   By: Margaretha Sheffield MD   On: 03/12/2020 14:34   Assessment:  64 year old female with SAH s/p clipping left PICA aneurysm. MRI showed new strokes, and obstructive hydrocephalus. Neurosurgery placed VP shunt 03/13/20. Condition is improving. Continue plan as below.  Recommendations:  - Continue keppra 750 mg twice daily for maintenance -Seizure precautions -As needed IV Ativan 2 mg for clinical seizure lasting more than 2 minutes Please re-consult Neurology as needed for any additional concerns.  Laurey Morale, MSN, NP-C [facility] [phone]

## 2020-03-14 NOTE — Progress Notes (Signed)
Daily Progress Note   Patient Name: Stacy Moran       Date: 03/14/2020 DOB: 06-29-1955  Age: 64 y.o. MRN#: [id number] Attending Physician: [name], MD Primary Care Physician: No primary care provider on file. Admit Date: 01/30/2020  Reason for Consultation/Follow-up: To discuss complex medical decision making related to patient's goals of care  12/15 patient develop seizure.  MR brain showed multiple small infarcts scattered thru out the right frontal and parietal lobes involving the corpus callosum, increased right cerebral fluid collection 1.6 cm with left midline shift, and development of obstructive hydrocephalus. 12/16 ventriculostomy placed.  With VP shunt scheduled for 12/20.   Subjective: Patient nods and smiles at me in a pleasant greeting.  She is unable to communicate with me in any detail.   On exam patient is responsive (mouthing words) and pleasant, but indicates she is not comfortable.  Unable to tell me what is uncomfortable.  She has excess secretions.  She is awake but less interactive than when I saw her on 12/7.  I spoke with Stacy Moran on the phone.  She tells me that she noted her mothers hand twitching for over a week.  She feels like she has been seizing during all of this time.  Stacy Moran had requested a scan.  She explains that she has brain tumors so she knows to ask for a scan.   We discussed how fragile Stacy Moran's is at this point.  Stacy Moran talked about how the patient loved her work at [employer] and [employer].  I explained that she will never work again.  Her life will be very different.   I expressed our desire to fully support her in any way possible.  However, my fear is that with just one or two more things that go the wrong way we will be in a situation that we  are unable to climb out of.  [name] expressed understanding of my comment and appreciation for the call.   Assessment: Very pleasant, extremely fragile woman with worsening hydrocephalus, scattered strokes, excess respiratory secretions, waiting for VP shunt on Monday.     Patient Profile/HPI:  64 y.o. female  with no past medical history in our system who was admitted on 01/30/2020 with a severe headache and was found to have a subarachnoid hemorrhage with a ruptured aneurysm.  No family was present at the time she was taken to emergent surgery with presumed consent.  Craniotomy with clipping of left PICA was performed.  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  She underwent thyroidectomy and tracheostomy placement.  She has had difficulty with subglottic obstruction and poor wound healing at the site of the trach.  She has also had difficulty with mucous plugging and secretions.  Patient frequently tries to pull out tracheostomy as well as other tubes.  She was recently understood to indicate that she no longer wanted the tracheostomy.  PMT was asked to evaluate for goals of care.  Length of Stay: 44   Vital Signs: BP (!) 155/78   Pulse 74   Temp (!) 97.5 F (36.4 C) (Axillary)   Resp 14   Ht 5\' 9"  (1.753 m)   Wt 64 kg   SpO2 98%   BMI 20.84 kg/m  SpO2: SpO2: 98 % O2 Device: O2 Device: Tracheostomy Collar O2 Flow Rate: O2 Flow Rate (L/min): 5 L/min       Palliative Assessment/Data: 20%     Palliative Care Plan    Recommendations/Plan:  Feel like Stacy Moran would benefit from a good understanding of the patient's long term functional prognosis.  Will discuss with Attending physician team.  PMT will continue to follow with you.  Code Status:  Full code  Prognosis:   Unable to determine at very high risk for acute decompensation, decline or death    Discharge Planning:  To Be Determined  Care plan was discussed with daughter  Thank you for  allowing the Palliative Medicine Team to assist in the care of this patient.  Total time spent:   35 min.     Greater than 50%  of this time was spent counseling and coordinating care related to the above assessment and plan.  Florentina Jenny, PA-C Palliative Medicine  Please contact Palliative MedicineTeam phone at [phone] for questions and concerns between 7 am - 7 pm.   Please see AMION for individual provider pager numbers.

## 2020-03-15 ENCOUNTER — Inpatient Hospital Stay (HOSPITAL_COMMUNITY): Payer: Self-pay

## 2020-03-15 LAB — CBC
HCT: 33.3 % — ABNORMAL LOW (ref 36.0–46.0)
Hemoglobin: 10.1 g/dL — ABNORMAL LOW (ref 12.0–15.0)
MCH: 26.8 pg (ref 26.0–34.0)
MCHC: 30.3 g/dL (ref 30.0–36.0)
MCV: 88.3 fL (ref 80.0–100.0)
Platelets: 277 10*3/uL (ref 150–400)
RBC: 3.77 MIL/uL — ABNORMAL LOW (ref 3.87–5.11)
RDW: 20 % — ABNORMAL HIGH (ref 11.5–15.5)
WBC: 6.1 10*3/uL (ref 4.0–10.5)
nRBC: 0 % (ref 0.0–0.2)

## 2020-03-15 LAB — GLUCOSE, CAPILLARY
Glucose-Capillary: 118 mg/dL — ABNORMAL HIGH (ref 70–99)
Glucose-Capillary: 122 mg/dL — ABNORMAL HIGH (ref 70–99)
Glucose-Capillary: 127 mg/dL — ABNORMAL HIGH (ref 70–99)
Glucose-Capillary: 137 mg/dL — ABNORMAL HIGH (ref 70–99)
Glucose-Capillary: 137 mg/dL — ABNORMAL HIGH (ref 70–99)
Glucose-Capillary: 142 mg/dL — ABNORMAL HIGH (ref 70–99)

## 2020-03-15 LAB — BASIC METABOLIC PANEL
Anion gap: 11 (ref 5–15)
BUN: 23 mg/dL (ref 8–23)
CO2: 27 mmol/L (ref 22–32)
Calcium: 9.6 mg/dL (ref 8.9–10.3)
Chloride: 100 mmol/L (ref 98–111)
Creatinine, Ser: 0.52 mg/dL (ref 0.44–1.00)
GFR, Estimated: >60 mL/min (ref >60)
Glucose, Bld: 150 mg/dL — ABNORMAL HIGH (ref 70–99)
Potassium: 4.1 mmol/L (ref 3.5–5.1)
Sodium: 138 mmol/L (ref 135–145)

## 2020-03-15 IMAGING — CT CT HEAD W/O CM
4 series · 15 of 47 positions shown, 17 images · non-contrast
Comparison: [DATE] head CT

[DATE] brain MRI

CLINICAL DATA: Subdural hematoma.  Worsening mental status.

EXAM:
CT HEAD WITHOUT CONTRAST
TECHNIQUE: Contiguous axial images were obtained from the base of the skull
through the vertex without intravenous contrast.

[Series 2: head wo · axial · 0.50mm/px · z∈[-44,+80]mm · 7 of 35 slices shown, 9 images]
[im 5/35  brain]
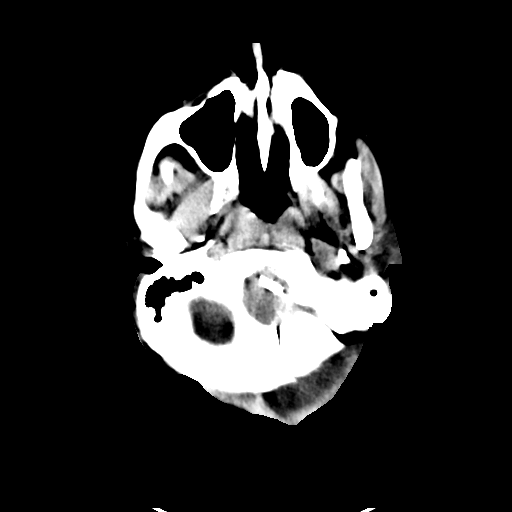
[im 5/35  bone]
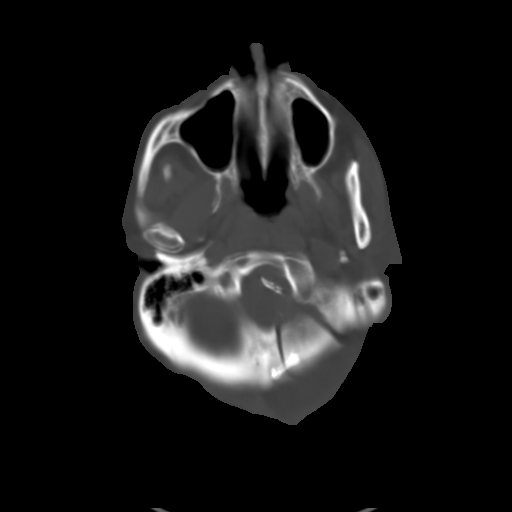
[im 9/35  brain]
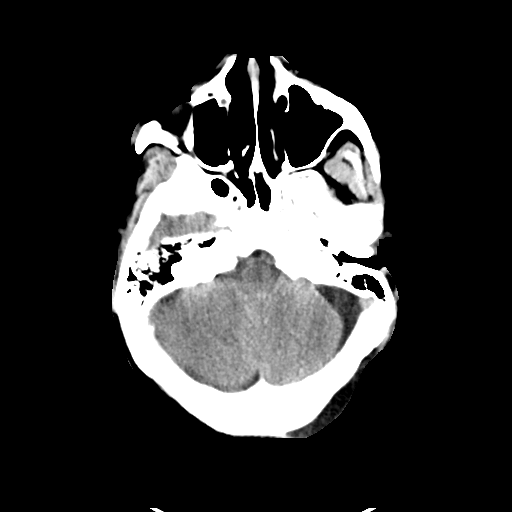
[im 13/35  brain]
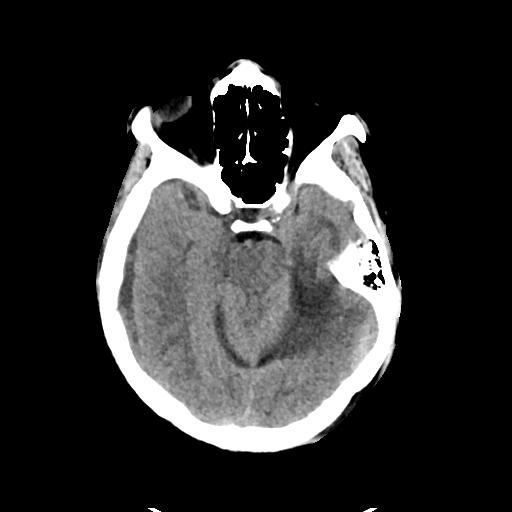
[im 18/35  brain]
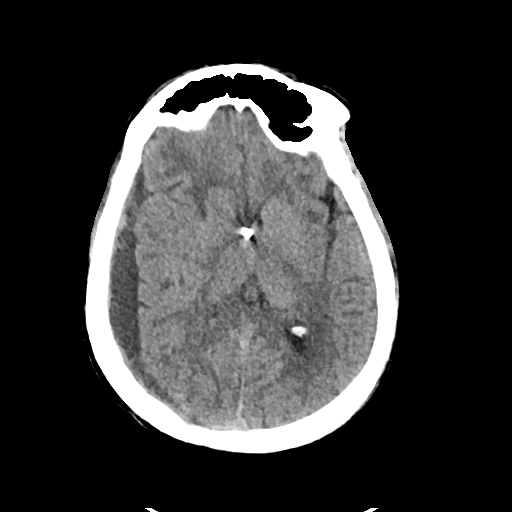
[im 22/35  brain]
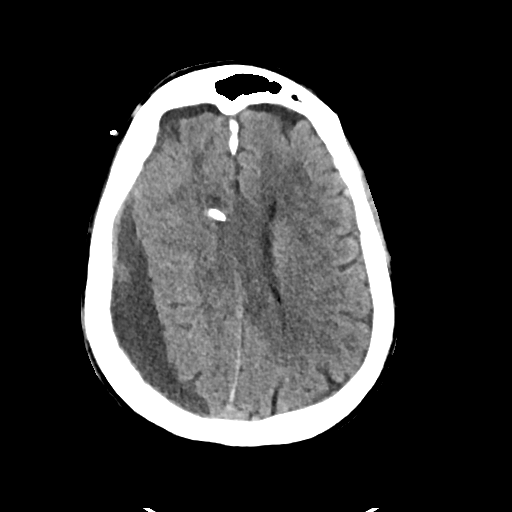
[im 22/35  bone]
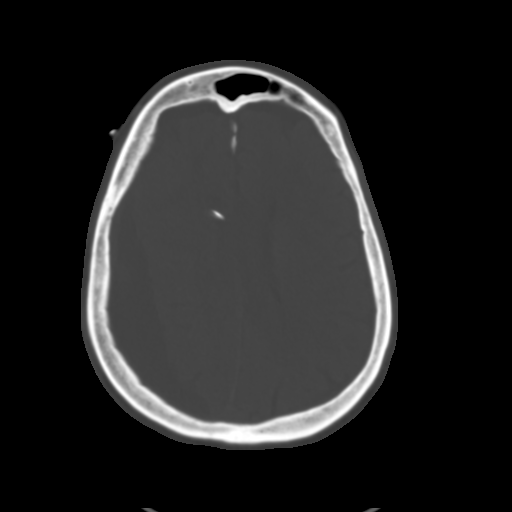
[im 26/35  brain]
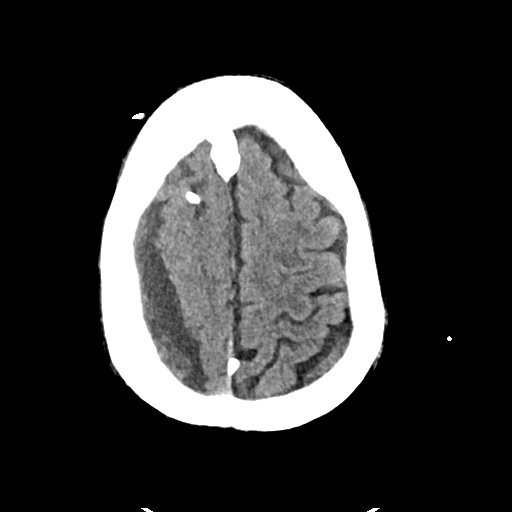
[im 30/35  brain]
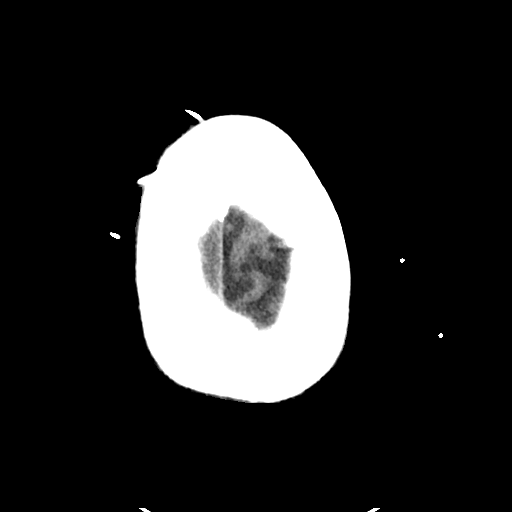

[Series 3: head bone · axial · 0.50mm/px · z∈[-48,-30]mm · 2 of 88 slices shown]
[im 9/88  bone]
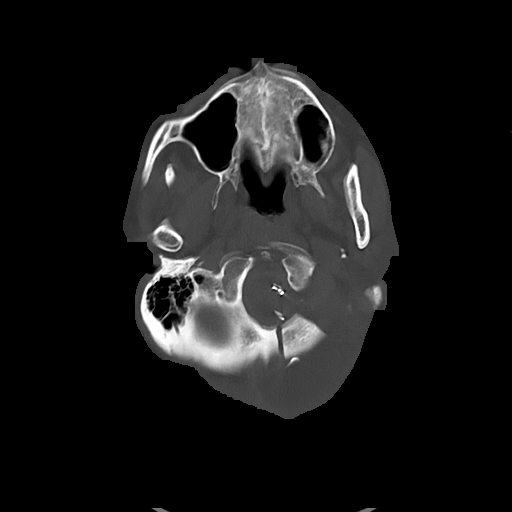
[im 18/88  bone]
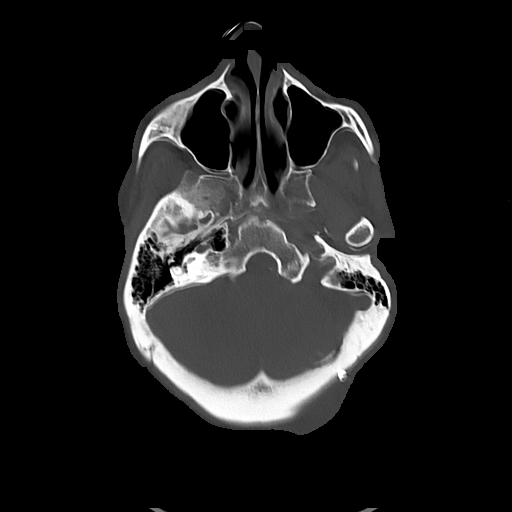

[Series 4: cor soft · coronal · 0.42mm/px · 3 of 79 slices shown]
[im 27/79  brain]
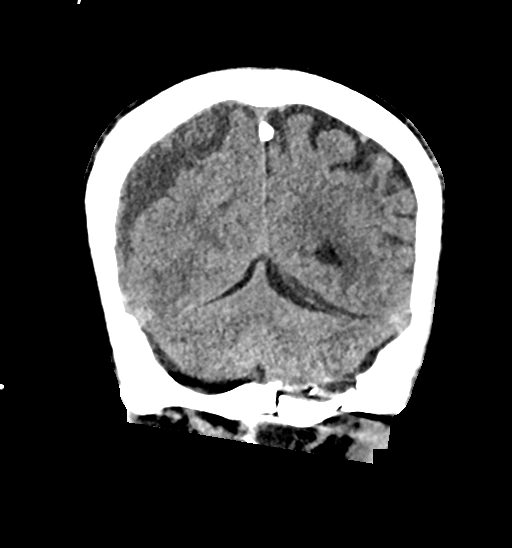
[im 35/79  brain]
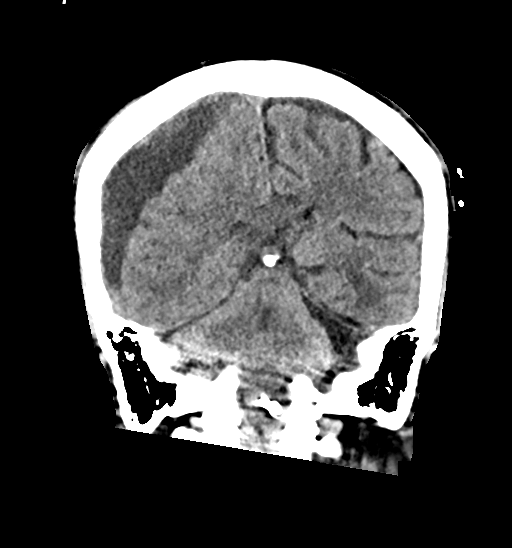
[im 44/79  brain]
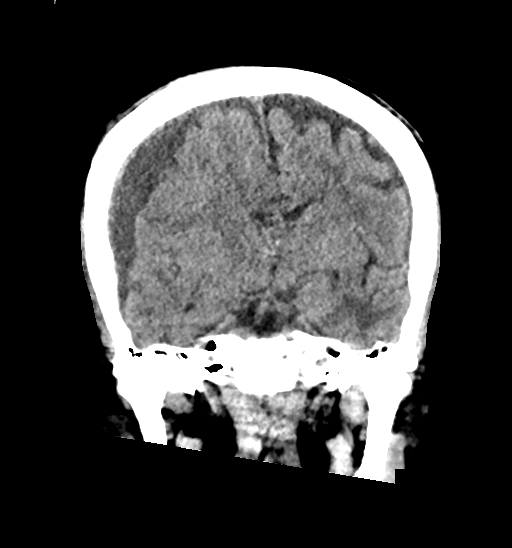

[Series 5: sag soft · sagittal · 0.36mm/px · 3 of 60 slices shown]
[im 24/60  brain]
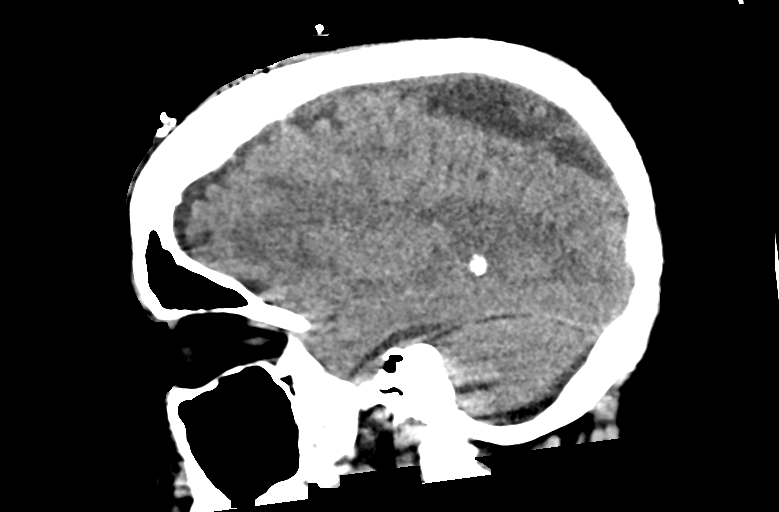
[im 30/60  brain]
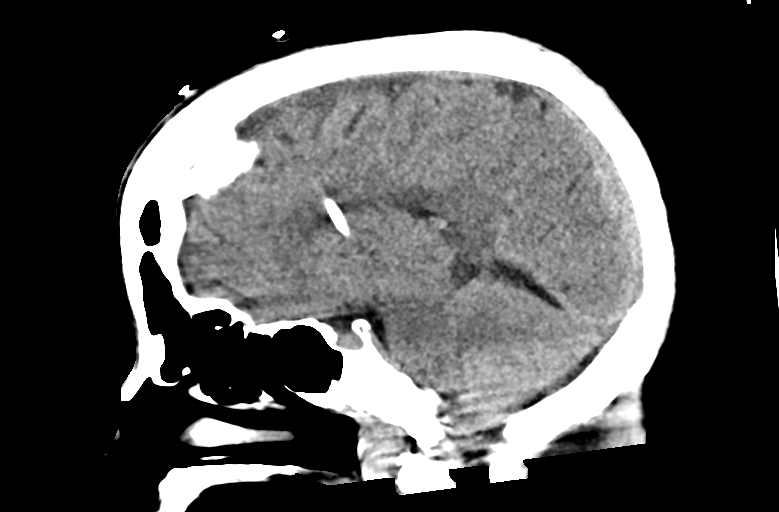
[im 37/60  brain]
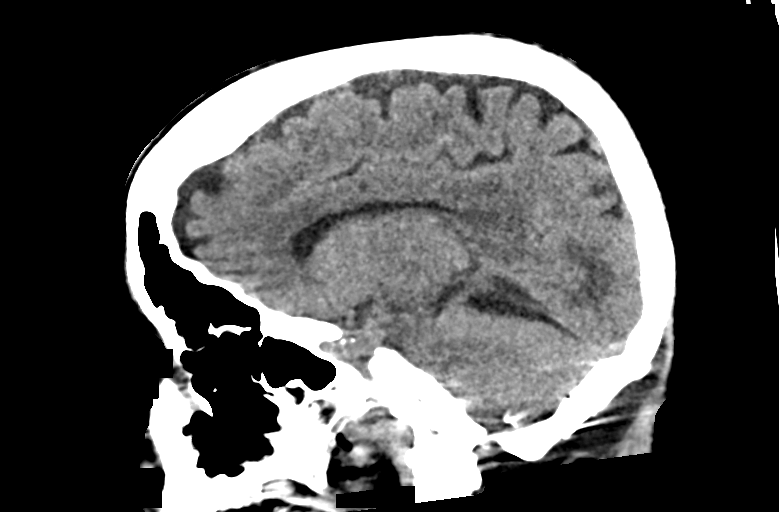

[15 of 47 positions shown; findings below may reference images not displayed]

FINDINGS: Brain: Since the recent MRI, there has been marked decrease in size
of the lateral ventricles with a slight increase in size of the
hypodense right convexity extra-axial collection. The thickness of
the collection now measures 2.0 cm. Leftward midline shift measures
6 mm. The quadrigeminal plate cistern and cerebral aqua duct remain
effaced. There is unchanged anterior displacement of the brainstem.
Extraventricular drainage catheter tip is at the foramina of [place].

Vascular: No abnormal hyperdensity of the major intracranial
arteries or dural venous sinuses. No intracranial atherosclerosis.

Skull: Left suboccipital craniotomy.

Sinuses/Orbits: No fluid levels or advanced mucosal thickening of
the visualized paranasal sinuses. No mastoid or middle ear effusion.
The orbits are normal.
IMPRESSION: 1. Marked decrease in size of the lateral ventricles with a slight
increase in size of the hypodense right convexity extra-axial
collection. These findings could indicate overshunting.
2. Anterior displacement of the brainstem with effaced quadrigeminal
plate cistern and cerebral aqueduct may indicate ascending
transtentorial herniation. Appearance is unchanged from the recent
MRI.
3. 6 mm leftward midline shift.

## 2020-03-15 MED ORDER — CHLORHEXIDINE GLUCONATE 0.12 % MT SOLN
OROMUCOSAL | Status: AC
  Filled 2020-03-15: qty 15

## 2020-03-15 MED ORDER — CHLORHEXIDINE GLUCONATE 0.12 % MT SOLN
OROMUCOSAL | Status: AC
  Administered 2020-03-15: 15 mL
  Filled 2020-03-15: qty 15

## 2020-03-15 NOTE — Progress Notes (Signed)
NAME:  Massiah Longanecker, MRN:  [id number], DOB:  February 28, 1956, LOS: 12 ADMISSION DATE:  01/30/2020, CONSULTATION DATE:  01/30/20 REFERRING MD:  Ralene Ok, CHIEF COMPLAINT:  Headache   Brief History   64 y/o presented with large subarachnoid hemorrhage due to ruptured PICA with resultant hydrocephalus.  Required tracheostomy with prolonged hospital course.    Past Medical History  Transferred to ICU from general floor overnight due to bradycardia. Improved with trachel suctioning of copious secretions. Bronchoscopy showing copious secretions but other etiology for blockage  Significant Hospital Events   11/03 Admitted 11/04 craniotomy with clipping of left PICA 11/12 total thyroidectomy, tracheostomy 11/13 start ABx for fever and tracheobronchitis 11/16 Now 11 days post clipping.  Drain stopped yesterday so left clamped.  Was interactive with PT yesterday. This morning, agitated and complaining of back pain. More somnolent following fentanyl this morning. Stable from neuro check  11/21 pulm called back again;  trach bleeding. we called ENT 11/23 subglottic obstruction which is new observed on MBS-->CT neck ordered to r/o infection. Also abx started. As she failed MBS IR consulted for PEG. Should be last day for nimodipine. We asked IM to assist w. Care  12/4:  Put on mechanical ventilation and transferred to ICU 12/6 tolerating PS 12/15 seizure prompting MRI showing worsening hydrocephalus 12/16 transferred to ICU for bedside ventriculostomy 12/18 no overnight events  Consults:  ENT (Dr.Rosen) Neuro surgery  Procedures:  11/03 ETT >>11/09 11/03 EVD >> 11/09 ETT >> 11/12 11/12 Tracheostomy >> 12/16 Ventriculostomy >>  Significant Diagnostic Tests:  11/3 CT head > large volume subarachnoid related to ruptured left PICA aneurysm, intraventricular reflux with hydrocephalus, chronic small vessel disease, chronic small vessel disease 11/9 H&N U/S > 2.3x2.3x2.4 mass in the left neck along the  left thyroid lobe 11/27 CT head > Resolution of subarachnoid hemorrhage, stable subdural hematoma 11/29 CT head > Unchanged size of right subdural hematoma with minimal leftward midline shift 12/15 MRI brain > Multiple small infarcts scattered throughout the right frontal and parietal lobes and involving the corpus callosum. Redemonstration of a right cerebral convexity fluid collection which measures up to 1.6 cm, which is likely increased from prior when comparing across modalities. Leftward midline shift is also slightly increased, now measuring approximately 6 mm. Interval development of obstructive hydrocephalus  Micro Data:  11/03 SARS2/ Flu > neg 11/08 respiratory culture >> negative 11/08 blood culture >>negative 11/11 urine culture >> multiple species 11/12 MRSA PCR >> negative 11/12 sputum: staph aureus, Citrobacter and Haemophilus parainfluenza (B lactamase +)  11/22 sputum >> multiple organism, no staph or strep 12/4 Sputum>> negative and gram-positive rods 12/4 wound trach site > neg 12/8 sputum > MSSA  Antimicrobials:  Zosyn 11/13 >> 11/14 Ceftriaxone 11/14 - 11/20 Ancef 12/13 >  Interim history/subjective:  Resting comfortably No overnight events Transferred to ICU for close monitoring following ventriculostomy Plan for VP shunt and evacuation of subdural hematoma Monday  Still with significant secretions  Objective   Blood pressure 123/69, pulse 71, temperature 98.8 F (37.1 C), temperature source Axillary, resp. rate 17, height 5\' 9"  (1.753 m), weight 64 kg, SpO2 97 %.    FiO2 (%):  [21 %] 21 %   Intake/Output Summary (Last 24 hours) at 03/15/2020 0815 Last data filed at 03/15/2020 0700 Gross per 24 hour  Intake 2326.24 ml  Output 1155 ml  Net 1171.24 ml   Filed Weights   03/06/20 0500 03/10/20 0500 03/11/20 0500  Weight: 59.7 kg 63.6 kg 64 kg  Examination:  Gen: Chronically ill-appearing, does not appear to be in distress HEENT: Divergent  strabismus Neck: Trach collar in place, significant mucus  CV: S1-S2 appreciated Pulm: Bilateral rhonchi Abd: Bowel sounds appreciated  Extm: ROM intact, Peripheral pulses intact, No peripheral edema Skin: Dry, Warm, normal turgor, no rashes, lesions, wounds.  Neuro: Alert, able to follow directions, Answers questions appropriately  Resolved Hospital Problem list    Tracheobronchitis-recurrent Beta lactamase + H flu, Citrobacter and S.aureus are susceptible.: Patient completed 7 days treatment with ceftriaxone Trach site bleeding  11/22 consult for bleeding around trach, TXA and epi nebs ordered per prelim recs   Assessment & Plan:   Delayed onset obstructive hydrocephalus following ruptured PICA Aneurysm status post clip S/p ventriculostomy placement -Planned VP shunt placement on 12/20  Seizures -Continue with Keppra -Seizure precautions -As needed Ativan for seizures  Acute on chronic respiratory failure -Continue trach collar with room air -Was seen trachea -Suctioning as needed  MSSA tracheobronchitis -On Ancef, to complete 12/19  Goiter s/p thyroidectomy and tracheostomy -Supportive measures -Synthroid  Diabetes -Continue SSI  HTN - amlodipine, hydralazine, labetalol  Sherrilyn Rist, MD [facility] Pager: [phone]

## 2020-03-15 NOTE — Progress Notes (Signed)
[facility] Progress Note Patient Name: Stacy Moran DOB: [birth year] MRN: [id number]   Date of Service  03/15/2020  HPI/Events of Note  Agitation - Request to renew restraint orders.   eICU Interventions  Will renew restraint orders X 13 hours.     Intervention Category Major Interventions: Delirium, psychosis, severe agitation - evaluation and management  Tahsin Benyo Eugene 03/15/2020, 9:07 PM

## 2020-03-15 NOTE — Progress Notes (Addendum)
Patient less awake and interactive briefly last night.  Back to baseline this morning.  Head CT scan with good decompression of her lateral ventricles and posterior fossa pseudomeningocele.  Convexity subdural hemorrhage stable.  She is afebrile.  Her vital signs are stable.  Urine output is good.  Drain output is high.  Drain height set at 0 cm of water Patient awakens to voice.  She appears aware.  Follows commands bilaterally.  Wounds clean and dry.  Overall stable.  I think the patient is being over drained with her ventriculostomy and we will increase the drain height to 10 cm of water.  Plan for shunting and evacuation of subdural hematoma Monday.Marland Kitchen

## 2020-03-15 NOTE — Plan of Care (Signed)
  Problem: Nutrition: Goal: Adequate nutrition will be maintained Outcome: Progressing   

## 2020-03-16 ENCOUNTER — Inpatient Hospital Stay (HOSPITAL_COMMUNITY): Payer: Self-pay

## 2020-03-16 LAB — SURGICAL PCR SCREEN
MRSA, PCR: NEGATIVE
Staphylococcus aureus: NEGATIVE

## 2020-03-16 LAB — GLUCOSE, CAPILLARY
Glucose-Capillary: 126 mg/dL — ABNORMAL HIGH (ref 70–99)
Glucose-Capillary: 134 mg/dL — ABNORMAL HIGH (ref 70–99)
Glucose-Capillary: 139 mg/dL — ABNORMAL HIGH (ref 70–99)
Glucose-Capillary: 142 mg/dL — ABNORMAL HIGH (ref 70–99)
Glucose-Capillary: 147 mg/dL — ABNORMAL HIGH (ref 70–99)
Glucose-Capillary: 156 mg/dL — ABNORMAL HIGH (ref 70–99)
Glucose-Capillary: 79 mg/dL (ref 70–99)

## 2020-03-16 IMAGING — MR MR MRV HEAD WO/W CM
6 of 10 series · 36 of 48 positions shown · IV contrast (Gadavist)
Comparison: Multiple previous neuro imaging studies, most recently
head CT yesterday and brain MRI [DATE]. previous CT angiography
[DATE]

CLINICAL DATA: Right-sided subdural hematoma.  VP shunt.

EXAM:
MR VENOGRAM HEAD WITHOUT AND WITH CONTRAST
TECHNIQUE: Angiographic images of the intracranial venous structures were
obtained using MRV technique without and with intravenous contrast.
CONTRAST:  5mL GADAVIST GADOBUTROL 1 MMOL/ML IV SOLN

[Series 5: tof_fl2d_paracor · coronal · 2.0mm · 0.98mm/px · 6 of 128 slices shown]
[im 1/128]
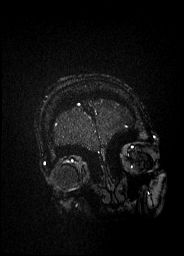
[im 26/128]
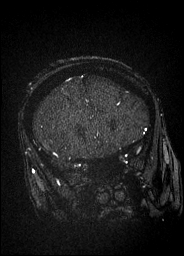
[im 51/128]
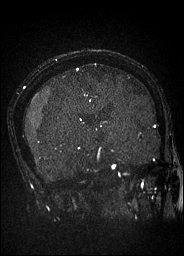
[im 77/128]
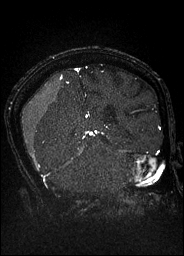
[im 102/128]
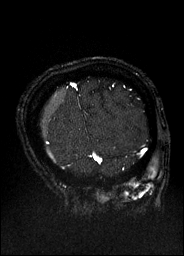
[im 128/128]
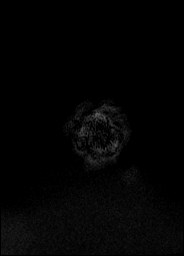

[Series 9: venous inhance coronal · sagittal · portal-venous · 0.9mm · 0.57mm/px · 7 of 160 slices shown]
[im 1/160]
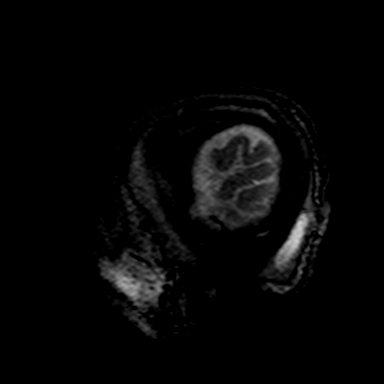
[im 27/160]
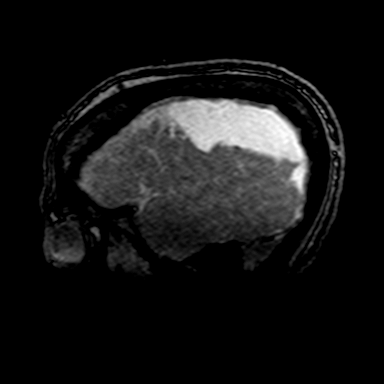
[im 54/160]
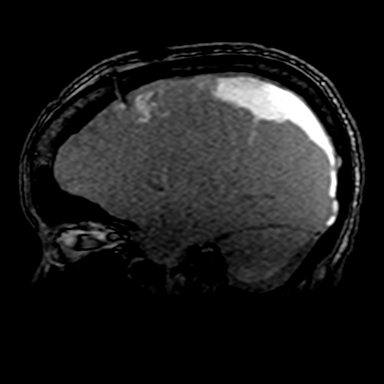
[im 80/160]
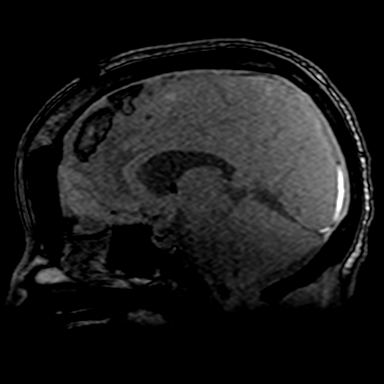
[im 107/160]
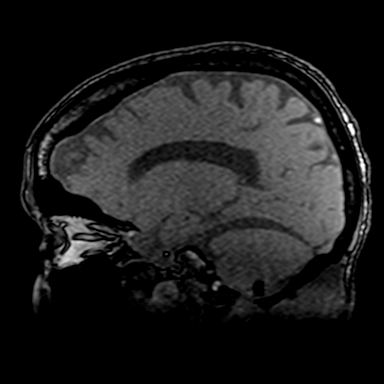
[im 133/160]
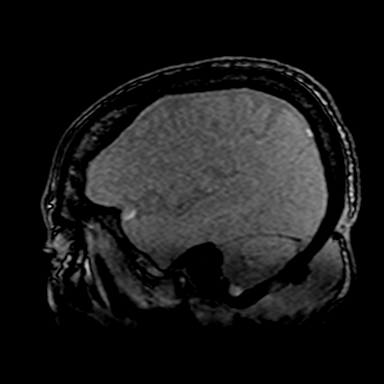
[im 160/160]
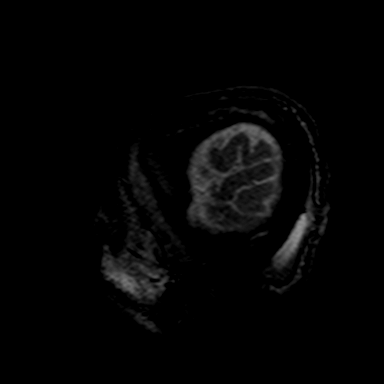

[Series 10: venous inhance coronal_msum · sagittal · portal-venous · 0.9mm · 0.57mm/px · 7 of 158 slices shown]
[im 1/158]
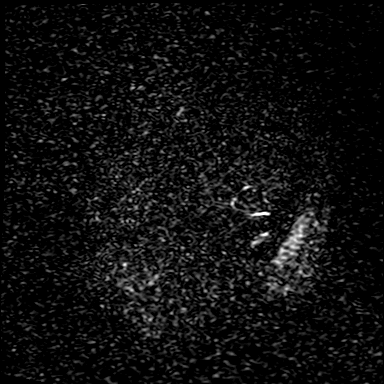
[im 27/158]
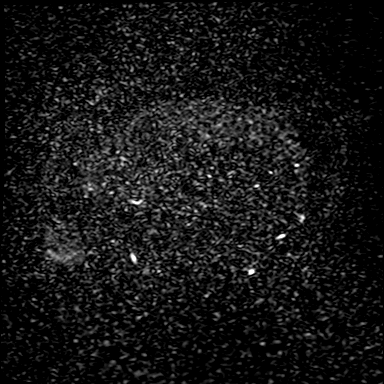
[im 53/158]
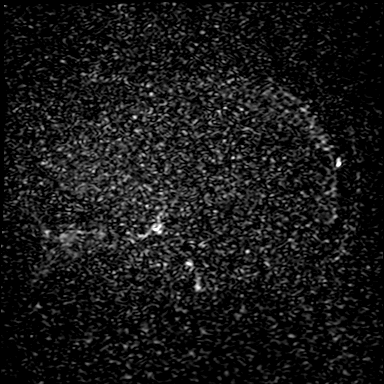
[im 79/158]
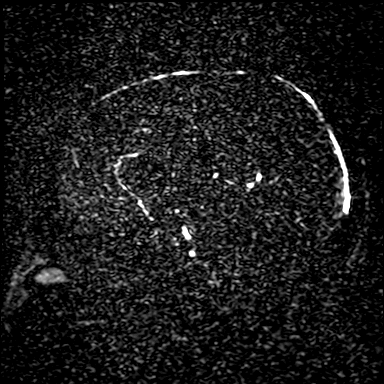
[im 105/158]
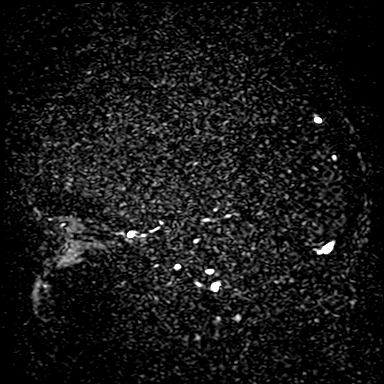
[im 131/158]
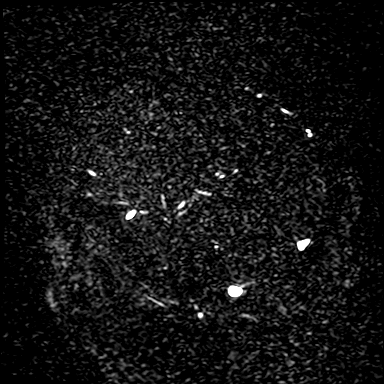
[im 158/158]
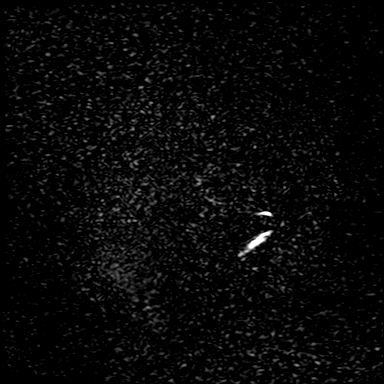

[Series 13: t1_fl3d_sag_p2_iso · sagittal · 1.0mm · 0.98mm/px · 9 of 192 slices shown]
[im 1/192]
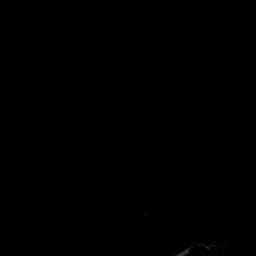
[im 24/192]
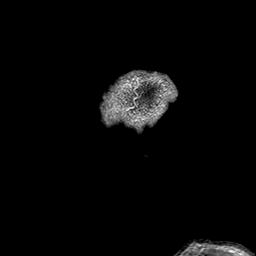
[im 48/192]
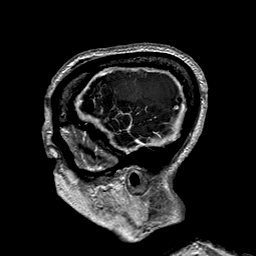
[im 72/192]
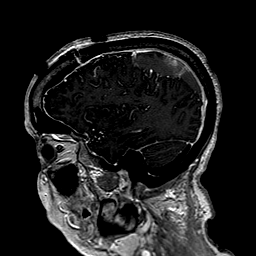
[im 96/192]
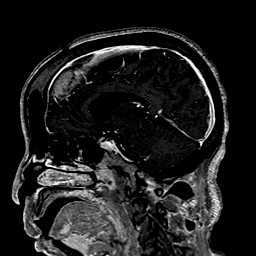
[im 120/192]
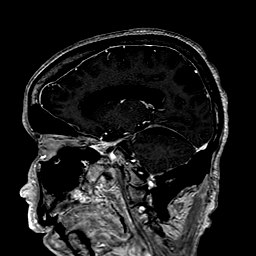
[im 144/192]
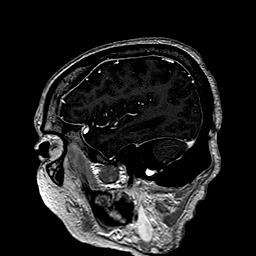
[im 168/192]
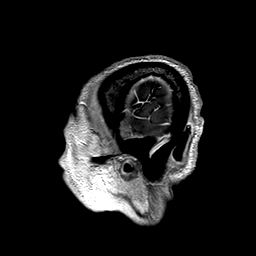
[im 192/192]
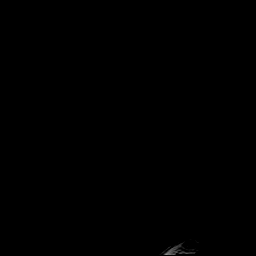

[Series 14: t1_fl3d_sag_p2_iso_mpr_ axial · axial · 2.0mm · 0.45mm/px · z∈[-191,+45]mm · 5 of 120 slices shown]
[im 1/120]
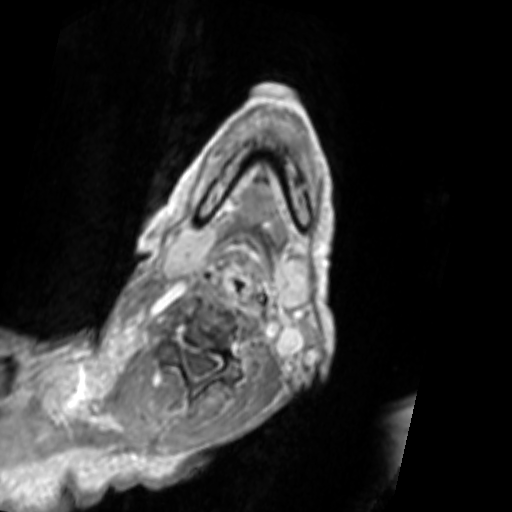
[im 30/120]
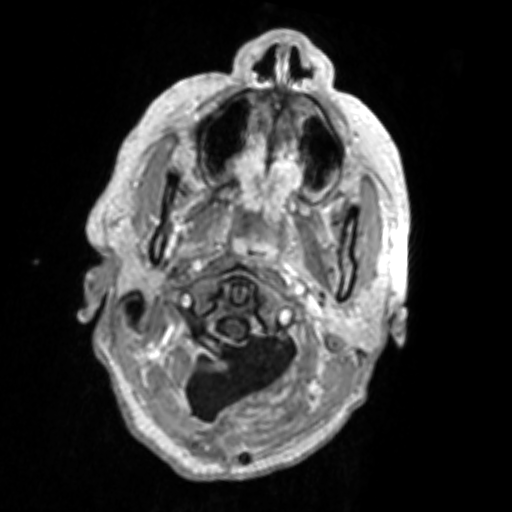
[im 60/120]
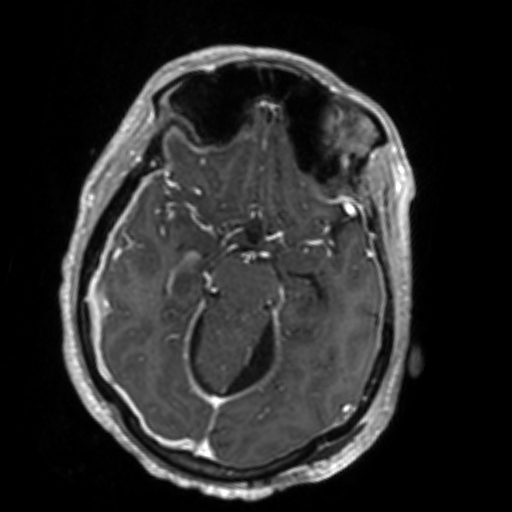
[im 90/120]
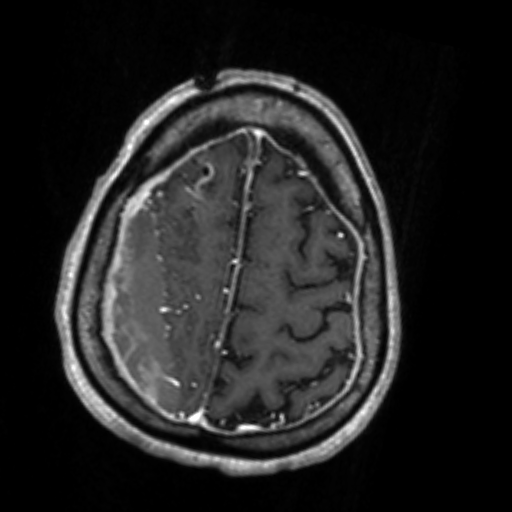
[im 120/120]
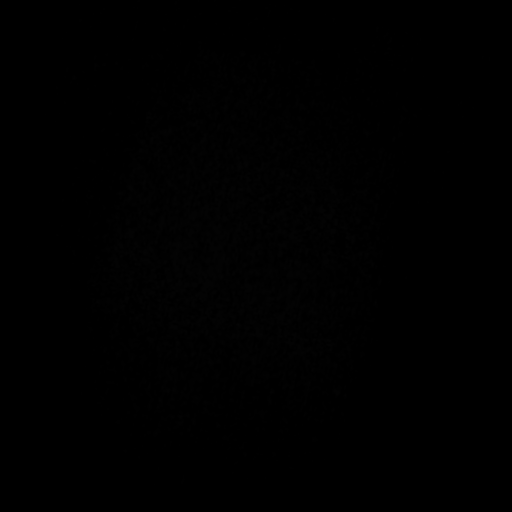

[Series 15: t1_fl3d_sag_p2_iso_mpr_coronal · coronal · 2.0mm · 0.45mm/px · 2 of 120 slices shown]
[im 1/120]
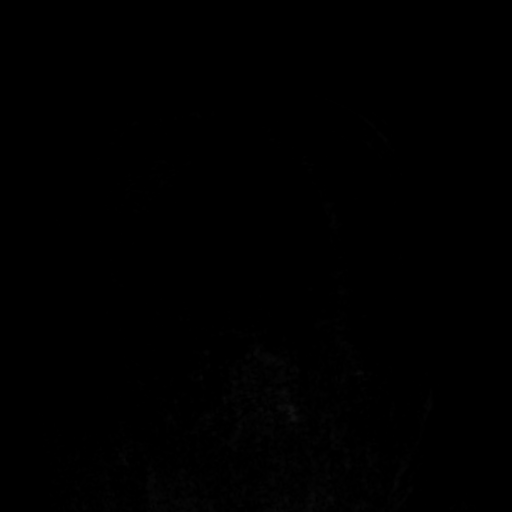
[im 30/120]
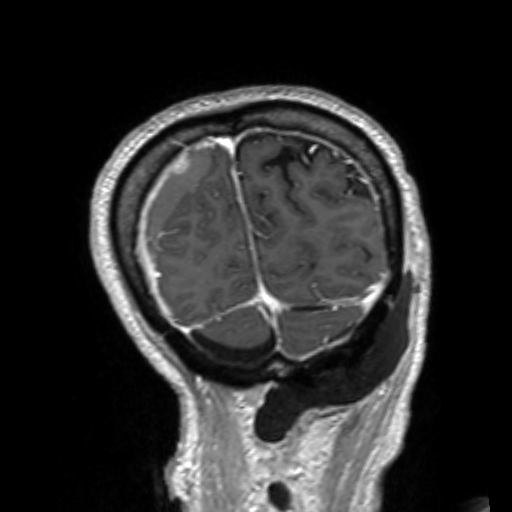

[36 of 48 positions shown; findings below may reference images not displayed]

FINDINGS: Superior sagittal sinus is patent. Left transverse sinus is patent
and normal. Right transverse sinus is a diminutive structure,
similar in appearance to the previous CT angiography. No suspicion
of actual thrombosis. Sigmoid sinuses and jugular veins are patent,
the vessels on the right being tiny on a developmental basis. No
evidence of thrombosis of the deep veins structures. No superficial
venous thrombosis is seen. Convexity subdural hematoma on the right
as seen previously, maximal thickness 2.1 cm. Thin subdural
collections in the posterior fossa as well, without apparent
change. Dural thickening and enhancement right more than left,
presumably secondary to the subdurals.
IMPRESSION: 1. No evidence of intracranial venous thrombosis. Diminutive right
transverse sinus, similar in appearance to the previous CT
angiography of [DATE].
2. Convexity subdural hematoma on the right, maximal thickness
cm, not significantly changed. Thin subdural collections in the
posterior fossa as well, without apparent change.

## 2020-03-16 MED ORDER — GADOBUTROL 1 MMOL/ML IV SOLN
5.0000 mL | Freq: Once | INTRAVENOUS | Status: AC | PRN
  Administered 2020-03-16: 5 mL via INTRAVENOUS

## 2020-03-16 MED ORDER — LORAZEPAM 2 MG/ML IJ SOLN
2.0000 mg | Freq: Once | INTRAMUSCULAR | Status: AC
  Administered 2020-03-16: 2 mg via INTRAVENOUS
  Filled 2020-03-16: qty 1

## 2020-03-16 NOTE — Progress Notes (Signed)
[facility] Progress Note Patient Name: Stacy Moran DOB: [birth year] MRN: [id number]   Date of Service  03/16/2020  HPI/Events of Note  Agitation - Request to renew soft R wrist restraint.  eICU Interventions  Will renew soft R wrist restraint X 12 hours.      Intervention Category Major Interventions: Delirium, psychosis, severe agitation - evaluation and management  [name] 03/16/2020, 9:40 PM

## 2020-03-16 NOTE — Plan of Care (Signed)
  Problem: Pain Managment: Goal: General experience of comfort will improve Outcome: Progressing   

## 2020-03-16 NOTE — Progress Notes (Addendum)
Neurosurgery Service Progress Note  Subjective: No acute events overnight   Objective: Vitals:   03/16/20 0755 03/16/20 0800 03/16/20 0900 03/16/20 1000  BP:  136/72 138/74 129/79  Pulse: 81 81 83 89  Resp: 19 (!) 22 15 15   Temp:      TempSrc:      SpO2: 100% 100% 100% 100%  Weight:      Height:        Physical Exam: Trach'd, interactive and answers questions via reading lips / mouthing words, eye closed OD, open OS, FC x 4, EVD site c/d/i  Assessment & Plan: 64 y.o. 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  -atypical pattern of hygromas including p fossa hygroma. Some signal intensity abnormality on the MRI near the left transverse sinus just adjacent to an arachnoid granulation, will get an MRV to r/o sinus thrombus -EVD 219 out at +10, exam stable -planned VPS + SDH evac tomorrow, NPO p MN -CCM recs  Judith Part  03/16/20 10:27 AM

## 2020-03-16 NOTE — Progress Notes (Signed)
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  Required tracheostomy with prolonged hospital course.    Past Medical History  Transferred to ICU from general floor overnight due to bradycardia. Improved with trachel suctioning of copious secretions. Bronchoscopy showing copious secretions but other etiology for blockage  Significant Hospital Events   11/03 Admitted 11/04 craniotomy with clipping of left PICA 11/12 total thyroidectomy, tracheostomy 11/13 start ABx for fever and tracheobronchitis 11/16 Now 11 days post clipping.  Drain stopped yesterday so left clamped.  Was interactive with PT yesterday. This morning, agitated and complaining of back pain. More somnolent following fentanyl this morning. Stable from neuro check  11/21 pulm called back again;  trach bleeding. we called ENT 11/23 subglottic obstruction which is new observed on MBS-->CT neck ordered to r/o infection. Also abx started. As she failed MBS IR consulted for PEG. Should be last day for nimodipine. We asked IM to assist w. Care  12/4:  Put on mechanical ventilation and transferred to ICU 12/6 tolerating PS 12/15 seizure prompting MRI showing worsening hydrocephalus 12/16 transferred to ICU for bedside ventriculostomy 12/18 no overnight events  Consults:  ENT (Dr.Rosen) Neuro surgery  Procedures:  11/03 ETT >>11/09 11/03 EVD >> 11/09 ETT >> 11/12 11/12 Tracheostomy >> 12/16 Ventriculostomy >>  Significant Diagnostic Tests:  11/3 CT head > large volume subarachnoid related to ruptured left PICA aneurysm, intraventricular reflux with hydrocephalus, chronic small vessel disease, chronic small vessel disease 11/9 H&N U/S > 2.3x2.3x2.4 mass in the left neck along the  left thyroid lobe 11/27 CT head > Resolution of subarachnoid hemorrhage, stable subdural hematoma 11/29 CT head > Unchanged size of right subdural hematoma with minimal leftward midline shift 12/15 MRI brain > Multiple small infarcts scattered throughout the right frontal and parietal lobes and involving the corpus callosum. Redemonstration of a right cerebral convexity fluid collection which measures up to 1.6 cm, which is likely increased from prior when comparing across modalities. Leftward midline shift is also slightly increased, now measuring approximately 6 mm. Interval development of obstructive hydrocephalus  Micro Data:  11/03 SARS2/ Flu > neg 11/08 respiratory culture >> negative 11/08 blood culture >>negative 11/11 urine culture >> multiple species 11/12 MRSA PCR >> negative 11/12 sputum: staph aureus, Citrobacter and Haemophilus parainfluenza (B lactamase +)  11/22 sputum >> multiple organism, no staph or strep 12/4 Sputum>> negative and gram-positive rods 12/4 wound trach site > neg 12/8 sputum > MSSA  Antimicrobials:  Zosyn 11/13 >> 11/14 Ceftriaxone 11/14 - 11/20 Ancef 12/13 >  Interim history/subjective:  Resting comfortably No overnight events Transferred to ICU for close monitoring following ventriculostomy Plan for VP shunt and evacuation of subdural hematoma Monday  Still with significant secretions  Objective   Blood pressure 138/74, pulse 83, temperature 97.6 F (36.4 C), temperature source Axillary, resp. rate 15, height 5\' 9"  (1.753 m), weight 64 kg, SpO2 100 %.    FiO2 (%):  [21 %] 21 %   Intake/Output Summary (Last 24 hours) at 03/16/2020 1000 Last data filed at 03/16/2020 0900 Gross per 24 hour  Intake 1952.68 ml  Output 469 ml  Net 1483.68 ml   Filed Weights   03/06/20 0500 03/10/20 0500 03/11/20 0500  Weight: 59.7 kg 63.6 kg 64 kg  Examination:  Gen: Chronically ill-appearing, does not appear to be in distress HEENT: Divergent  strabismus Neck: Trach collar in place, significant mucus  CV: S1-S2 appreciated Pulm: Bilateral rhonchi Abd: Bowel sounds appreciated  Extm: ROM intact, Peripheral pulses intact, No peripheral edema Skin: Dry, Warm, normal turgor, no rashes, lesions, wounds.  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

## 2020-03-17 LAB — GLUCOSE, CAPILLARY
Glucose-Capillary: 68 mg/dL — ABNORMAL LOW (ref 70–99)
Glucose-Capillary: 98 mg/dL (ref 70–99)
Glucose-Capillary: 112 mg/dL — ABNORMAL HIGH (ref 70–99)
Glucose-Capillary: 112 mg/dL — ABNORMAL HIGH (ref 70–99)
Glucose-Capillary: 107 mg/dL — ABNORMAL HIGH (ref 70–99)
Glucose-Capillary: 107 mg/dL — ABNORMAL HIGH (ref 70–99)
Glucose-Capillary: 139 mg/dL — ABNORMAL HIGH (ref 70–99)

## 2020-03-17 MED ORDER — DEXTROSE 50 % IV SOLN
12.5000 g | Freq: Once | INTRAVENOUS | Status: AC
  Administered 2020-03-17: 12.5 g via INTRAVENOUS
  Filled 2020-03-17: qty 50

## 2020-03-17 MED ORDER — ORAL CARE MOUTH RINSE
15.0000 mL | Freq: Two times a day (BID) | OROMUCOSAL | Status: DC
  Administered 2020-03-17 – 2020-04-22 (×61): 15 mL via OROMUCOSAL

## 2020-03-17 MED ORDER — SODIUM CHLORIDE 0.9 % IV SOLN: INTRAVENOUS | Status: DC | PRN

## 2020-03-17 NOTE — Progress Notes (Signed)
  NEUROSURGERY PROGRESS NOTE   No issues overnight.   EXAM:  BP 122/76   Pulse 80   Temp 98.6 F (37 C) (Oral)   Resp 16   Ht 5\' 9"  (1.753 m)   Wt 64 kg   SpO2 94%   BMI 20.84 kg/m   Easily arouses Nods to questions, mouths answers to simple questions  MAE well EVD in place, clear CSF   IMPRESSION:  64 y.o. female s/p SAH clipping of LPICA aneurysm, delayed onset HCP.  PLAN: - Plan on VP shunt tomorrow with evac of chronic right SDH - Will clamp EVD today at 1400.

## 2020-03-17 NOTE — Consult Note (Signed)
[place] Nurse wound follow up Patient receiving care in [facility] 4N16. Wound type: full thickness Measurement:deferred Wound bed: not visible due to dressing Drainage (amount, consistency, odor) none seen today Periwound: protected by foam dressing and Drawtex Dressing procedure/placement/frequency: Continue current wound therapy. Val Riles, RN, MSN, CWOCN, CNS-BC, pager [phone]

## 2020-03-17 NOTE — Progress Notes (Signed)
NAME:  Stacy Moran, MRN:  [id number], DOB:  27-Sep-1955, LOS: 70 ADMISSION DATE:  01/30/2020, CONSULTATION DATE:  01/30/2020 REFERRING MD:  Ralene Ok, CHIEF COMPLAINT:  Headache   Brief History   64 yo female with large SAH from ruptured PICA complicated by hydrocephalus.  Required tracheostomy for airway protection.  No significant past medical history.  Significant Hospital Events   11/03 Admitted 11/04 craniotomy with clipping of left PICA 11/12 total thyroidectomy, tracheostomy 11/13 start ABx for fever and tracheobronchitis 11/16 Now 11 days post clipping.  Drain stopped yesterday so left clamped.  Was interactive with PT yesterday. This morning, agitated and complaining of back pain. More somnolent following fentanyl this morning. Stable from neuro check  11/21 pulm called back again;  trach bleeding. we called ENT 11/23 subglottic obstruction which is new observed on MBS-->CT neck ordered to r/o infection. Also abx started. As she failed MBS IR consulted for PEG. Should be last day for nimodipine. We asked IM to assist w. Care  12/04  Put on mechanical ventilation and transferred to ICU 12/06 tolerating PS 12/15 seizure prompting MRI showing worsening hydrocephalus 12/16 transferred to ICU for bedside ventriculostomy  Consults:  ENT (Dr.Rosen) Palliative care Neurology >> s/o 12/17  Procedures:  11/03 ETT >>11/09 11/03 EVD >> 11/09 ETT >> 11/12 11/12 Tracheostomy >> 12/16 Ventriculostomy >>  Significant Diagnostic Tests:   11/3 CT head > large volume subarachnoid related to ruptured left PICA aneurysm, intraventricular reflux with hydrocephalus, chronic small vessel disease, chronic small vessel disease  11/9 H&N U/S > 2.3x2.3x2.4 mass in the left neck along the left thyroid lobe  11/27 CT head > Resolution of subarachnoid hemorrhage, stable subdural hematoma  11/29 CT head > Unchanged size of right subdural hematoma with minimal leftward midline shift  12/15 MRI  brain > Multiple small infarcts scattered throughout the right frontal and parietal lobes and involving the corpus callosum. Redemonstration of a right cerebral convexity fluid collection which measures up to 1.6 cm, which is likely increased from prior when comparing across modalities. Leftward midline shift is also slightly increased, now measuring approximately 6 mm. Interval development of obstructive hydrocephalus  12/19 MRV head > no venous thrombosis, diminutive Rt transverse sinus, Rt SDH 2.1 cm, thin subdural collections posterior fossa  Micro Data:  11/03 SARS2/ Flu > neg 11/08 respiratory culture >> negative 11/08 blood culture >>negative 11/11 urine culture >> multiple species 11/12 MRSA PCR >> negative 11/12 sputum: staph aureus, Citrobacter and Haemophilus parainfluenza (B lactamase +)  11/22 sputum >> multiple organism, no staph or strep 12/4 Sputum>> negative and gram-positive rods 12/4 wound trach site > neg 12/8 sputum > MSSA  Antimicrobials:  Zosyn 11/13 >> 11/14 Ceftriaxone 11/14 - 11/20 Ancef 12/13 > 12/19  Interim history/subjective:  Denies chest pain.  Objective   Blood pressure (!) 133/91, pulse 79, temperature 97.7 F (36.5 C), temperature source Axillary, resp. rate 19, height 5\' 9"  (1.753 m), weight 64 kg, SpO2 95 %.    FiO2 (%):  [21 %] 21 %   Intake/Output Summary (Last 24 hours) at 03/17/2020 0758 Last data filed at 03/17/2020 0700 Gross per 24 hour  Intake 1368.75 ml  Output 1166 ml  Net 202.75 ml   Filed Weights   03/06/20 0500 03/10/20 0500 03/11/20 0500  Weight: 59.7 kg 63.6 kg 64 kg    Examination:  General - sleepy Eyes - pupils reactive ENT - tracheostomy site clean Cardiac - regular rate/rhythm, no murmur Chest - scattered rhonchi Abdomen -  soft, non tender Extremities - no cyanosis, clubbing, or edema Skin - no rashes Neuro - follows commands, moves extremities  Resolved Hospital Problem list   Recurrent  tracheobronchitis-recurrent from  Beta lactamase + H flu, Citrobacter and S.aureus are susceptible.: Patient completed 7 days treatment with ceftriaxone, Trach site bleeding   Assessment & Plan:   Compromised airway s/p tracheostomy. - trach care - will need to re-consult speech therapy after VP shunt  SAH from ruptured PICA complicated by hydrocephalus s/p clipping. Atypical hygromas. - for VP shunt and drainage of SDH on 12/20  Seizures - continue keppra  Agitation, insomnia. 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 - continue synthroid  Hyperglycemia w/o history of DM. - SSI  HTN - goal SBP < 160 - continue amlodipine, hydralazine, labetalol  Anemia of critical illness. - f/u CBC intermittently - transfuse for Hb < 7 or significant bleeding  Goals of care. - see by palliative care (last on 12/17) - continue aggressive therapy  Best Practice:  Nutrition: tube feeds DVT prophylaxis: SQ heparin SUP: protonix Mobility: PT/OT >> recommending SNF when able Code status: full code Disposition: ICU  Labs:   CMP Latest Ref Rng & Units 03/15/2020 03/14/2020 03/14/2020  Glucose 70 - 99 mg/dL 150(H) - 140(H)  BUN 8 - 23 mg/dL 23 - 21  Creatinine 0.44 - 1.00 mg/dL 0.52 - 0.46  Sodium 135 - 145 mmol/L 138 - 135  Potassium 3.5 - 5.1 mmol/L 4.1 4.4 6.0(H)  Chloride 98 - 111 mmol/L 100 - 99  CO2 22 - 32 mmol/L 27 - 20(L)  Calcium 8.9 - 10.3 mg/dL 9.6 - 9.1  Total Protein 6.5 - 8.1 g/dL - - -  Total Bilirubin 0.3 - 1.2 mg/dL - - -  Alkaline Phos 38 - 126 U/L - - -  AST 15 - 41 U/L - - -  ALT 0 - 44 U/L - - -    CBC Latest Ref Rng & Units 03/15/2020 03/14/2020 03/13/2020  WBC 4.0 - 10.5 K/uL 6.1 6.7 8.1  Hemoglobin 12.0 - 15.0 g/dL 10.1(L) 10.2(L) 10.7(L)  Hematocrit 36.0 - 46.0 % 33.3(L) 33.3(L) 33.7(L)  Platelets 150 - 400 K/uL 277 270 267    ABG    Component Value Date/Time   PHART 7.444 02/06/2020 0843   PCO2ART 42.8  02/06/2020 0843   PO2ART 157 (H) 02/06/2020 0843   HCO3 29.2 (H) 02/06/2020 0843   TCO2 30 02/06/2020 0843   ACIDBASEDEF 4.0 (H) 01/31/2020 0113   O2SAT 99.0 02/06/2020 0843    CBG (last 3)  Recent Labs    03/17/20 0318 03/17/20 0404 03/17/20 0740  GLUCAP 68* 98 112*    Lab Results  Component Value Date   TSH 0.118 (L) 02/07/2020    Lab Results  Component Value Date   HGBA1C 6.2 (H) 01/30/2020    Signature:  Chesley Mires, MD [place] Pager - [phone] 03/17/2020, 8:11 AM

## 2020-03-18 ENCOUNTER — Encounter (HOSPITAL_COMMUNITY)
Admission: EM | Disposition: A | Discharge status: Rehabilitation Facility | Payer: Self-pay | Source: Home / Self Care | Attending: Neurosurgery

## 2020-03-18 ENCOUNTER — Inpatient Hospital Stay (HOSPITAL_COMMUNITY): Payer: Self-pay | Admitting: Certified Registered Nurse Anesthetist

## 2020-03-18 HISTORY — PX: BURR HOLE: SHX908

## 2020-03-18 HISTORY — PX: LAPAROSCOPIC REVISION VENTRICULAR-PERITONEAL (V-P) SHUNT: SHX5924

## 2020-03-18 HISTORY — PX: VENTRICULOPERITONEAL SHUNT: SHX204

## 2020-03-18 LAB — GLUCOSE, CAPILLARY
Glucose-Capillary: 113 mg/dL — ABNORMAL HIGH (ref 70–99)
Glucose-Capillary: 113 mg/dL — ABNORMAL HIGH (ref 70–99)
Glucose-Capillary: 116 mg/dL — ABNORMAL HIGH (ref 70–99)
Glucose-Capillary: 142 mg/dL — ABNORMAL HIGH (ref 70–99)
Glucose-Capillary: 161 mg/dL — ABNORMAL HIGH (ref 70–99)

## 2020-03-18 LAB — TYPE AND SCREEN
ABO/RH(D): A POS
Antibody Screen: NEGATIVE

## 2020-03-18 SURGERY — SHUNT INSERTION VENTRICULAR-PERITONEAL
Anesthesia: General

## 2020-03-18 MED ORDER — CEFAZOLIN SODIUM-DEXTROSE 2-3 GM-%(50ML) IV SOLR
INTRAVENOUS | Status: DC | PRN
  Administered 2020-03-18: 2 g via INTRAVENOUS

## 2020-03-18 MED ORDER — SODIUM CHLORIDE 0.9 % IV SOLN: INTRAVENOUS | Status: DC | PRN

## 2020-03-18 MED ORDER — LIDOCAINE 2% (20 MG/ML) 5 ML SYRINGE
INTRAMUSCULAR | Status: DC | PRN
  Administered 2020-03-18: 60 mg via INTRAVENOUS

## 2020-03-18 MED ORDER — SODIUM CHLORIDE (PF) 0.9 % IJ SOLN
INTRAMUSCULAR | Status: DC | PRN
  Administered 2020-03-18: 1000 mL

## 2020-03-18 MED ORDER — CEFAZOLIN SODIUM-DEXTROSE 2-4 GM/100ML-% IV SOLN
INTRAVENOUS | Status: AC
  Filled 2020-03-18: qty 100

## 2020-03-18 MED ORDER — BACITRACIN ZINC 500 UNIT/GM EX OINT
TOPICAL_OINTMENT | CUTANEOUS | Status: AC
  Filled 2020-03-18: qty 28.35

## 2020-03-18 MED ORDER — ROCURONIUM BROMIDE 100 MG/10ML IV SOLN: INTRAVENOUS | Status: DC | PRN

## 2020-03-18 MED ORDER — BUPIVACAINE HCL (PF) 0.5 % IJ SOLN
INTRAMUSCULAR | Status: AC
  Filled 2020-03-18: qty 30

## 2020-03-18 MED ORDER — DEXAMETHASONE SODIUM PHOSPHATE 10 MG/ML IJ SOLN
INTRAMUSCULAR | Status: DC | PRN
  Administered 2020-03-18: 10 mg via INTRAVENOUS

## 2020-03-18 MED ORDER — ROCURONIUM BROMIDE 10 MG/ML (PF) SYRINGE
PREFILLED_SYRINGE | INTRAVENOUS | Status: DC | PRN
  Administered 2020-03-18: 50 mg via INTRAVENOUS
  Administered 2020-03-18: 30 mg via INTRAVENOUS

## 2020-03-18 MED ORDER — FENTANYL CITRATE (PF) 250 MCG/5ML IJ SOLN
INTRAMUSCULAR | Status: DC | PRN
  Administered 2020-03-18: 100 ug via INTRAVENOUS

## 2020-03-18 MED ORDER — THROMBIN 5000 UNITS EX SOLR
CUTANEOUS | Status: AC
  Filled 2020-03-18: qty 5000

## 2020-03-18 MED ORDER — LIDOCAINE-EPINEPHRINE 1 %-1:100000 IJ SOLN
INTRAMUSCULAR | Status: AC
  Filled 2020-03-18: qty 1

## 2020-03-18 MED ORDER — SUGAMMADEX SODIUM 200 MG/2ML IV SOLN
INTRAVENOUS | Status: DC | PRN
  Administered 2020-03-18: 200 mg via INTRAVENOUS

## 2020-03-18 MED ORDER — ALBUMIN HUMAN 5 % IV SOLN: INTRAVENOUS | Status: DC | PRN

## 2020-03-18 MED ORDER — PROPOFOL 10 MG/ML IV BOLUS
INTRAVENOUS | Status: DC | PRN
  Administered 2020-03-18: 120 mg via INTRAVENOUS

## 2020-03-18 MED ORDER — THROMBIN 20000 UNITS EX SOLR
CUTANEOUS | Status: DC | PRN
  Administered 2020-03-18: 20 mL via TOPICAL

## 2020-03-18 MED ORDER — PHENYLEPHRINE HCL-NACL 10-0.9 MG/250ML-% IV SOLN: INTRAVENOUS | Status: DC | PRN

## 2020-03-18 MED ORDER — DEXAMETHASONE SODIUM PHOSPHATE 10 MG/ML IJ SOLN
INTRAMUSCULAR | Status: AC
  Filled 2020-03-18: qty 1

## 2020-03-18 MED ORDER — BUPIVACAINE HCL (PF) 0.25 % IJ SOLN
INTRAMUSCULAR | Status: AC
  Filled 2020-03-18: qty 30

## 2020-03-18 MED ORDER — FENTANYL CITRATE (PF) 250 MCG/5ML IJ SOLN
INTRAMUSCULAR | Status: AC
  Filled 2020-03-18: qty 5

## 2020-03-18 MED ORDER — ONDANSETRON HCL 4 MG/2ML IJ SOLN
INTRAMUSCULAR | Status: DC | PRN
  Administered 2020-03-18: 4 mg via INTRAVENOUS

## 2020-03-18 MED ORDER — FENTANYL CITRATE (PF) 100 MCG/2ML IJ SOLN
25.0000 ug | INTRAMUSCULAR | Status: DC | PRN
  Administered 2020-03-18: 50 ug via INTRAVENOUS

## 2020-03-18 MED ORDER — FENTANYL CITRATE (PF) 100 MCG/2ML IJ SOLN
INTRAMUSCULAR | Status: AC
  Filled 2020-03-18: qty 2

## 2020-03-18 MED ORDER — ONDANSETRON HCL 4 MG/2ML IJ SOLN
INTRAMUSCULAR | Status: AC
  Filled 2020-03-18: qty 2

## 2020-03-18 MED ORDER — LIDOCAINE 2% (20 MG/ML) 5 ML SYRINGE
INTRAMUSCULAR | Status: AC
  Filled 2020-03-18: qty 5

## 2020-03-18 MED ORDER — BUPIVACAINE HCL 0.25 % IJ SOLN
INTRAMUSCULAR | Status: DC | PRN
  Administered 2020-03-18: 4 mL

## 2020-03-18 MED ORDER — 0.9 % SODIUM CHLORIDE (POUR BTL) OPTIME
TOPICAL | Status: DC | PRN
  Administered 2020-03-18: 3000 mL

## 2020-03-18 MED ORDER — PHENYLEPHRINE HCL-NACL 10-0.9 MG/250ML-% IV SOLN
INTRAVENOUS | Status: DC | PRN
  Administered 2020-03-18: 25 ug/min via INTRAVENOUS

## 2020-03-18 MED ORDER — PROPOFOL 10 MG/ML IV BOLUS
INTRAVENOUS | Status: AC
  Filled 2020-03-18: qty 20

## 2020-03-18 MED ORDER — BACITRACIN ZINC 500 UNIT/GM EX OINT
TOPICAL_OINTMENT | CUTANEOUS | Status: DC | PRN
  Administered 2020-03-18: 1 via TOPICAL

## 2020-03-18 MED ORDER — THROMBIN 5000 UNITS EX SOLR
OROMUCOSAL | Status: DC | PRN
  Administered 2020-03-18: 5 mL via TOPICAL

## 2020-03-18 MED ORDER — THROMBIN 20000 UNITS EX SOLR
CUTANEOUS | Status: AC
  Filled 2020-03-18: qty 20000

## 2020-03-18 SURGICAL SUPPLY — 121 items
ADH SKN CLS APL DERMABOND .7 (GAUZE/BANDAGES/DRESSINGS) ×1
ADH SKN CLS LQ APL DERMABOND (GAUZE/BANDAGES/DRESSINGS) ×1
APL PRP STRL LF DISP 70% ISPRP (MISCELLANEOUS) ×1
APL SKNCLS STERI-STRIP NONHPOA (GAUZE/BANDAGES/DRESSINGS)
BENZOIN TINCTURE PRP APPL 2/3 (GAUZE/BANDAGES/DRESSINGS) IMPLANT
BLADE CLIPPER SURG (BLADE) ×2 IMPLANT
BLADE SURG 11 STRL SS (BLADE) IMPLANT
BLADE ULTRA TIP 2M (BLADE) ×2 IMPLANT
BNDG GAUZE ELAST 4 BULKY (GAUZE/BANDAGES/DRESSINGS) IMPLANT
BOOT SUTURE AID YELLOW STND (SUTURE) ×2 IMPLANT
BUR ACORN 6.0 PRECISION (BURR) ×2 IMPLANT
BUR MATCHSTICK NEURO 3.0 LAGG (BURR) IMPLANT
BUR PRECISION FLUTE 5.0 (BURR) ×2 IMPLANT
CABLE BIPOLOR RESECTION CORD (MISCELLANEOUS) ×2 IMPLANT
CANISTER SUCT 3000ML PPV (MISCELLANEOUS) ×4 IMPLANT
CARTRIDGE OIL MAESTRO DRILL (MISCELLANEOUS) ×1 IMPLANT
CATH ROBINSON RED A/P 12FR (CATHETERS) ×2 IMPLANT
CATH STRATA NSC SNAP SHUNT (Shunt) ×2 IMPLANT
CATH VENTRICULAR 7CM (Shunt) ×2 IMPLANT
CHLORAPREP W/TINT 26 (MISCELLANEOUS) ×2 IMPLANT
CLIP RANEY DISP (INSTRUMENTS) IMPLANT
CLIP VESOCCLUDE MED 6/CT (CLIP) IMPLANT
COVER BURR HOLE 7 (Orthopedic Implant) ×4 IMPLANT
COVER SURGICAL LIGHT HANDLE (MISCELLANEOUS) ×2 IMPLANT
COVER WAND RF STERILE (DRAPES) IMPLANT
DECANTER SPIKE VIAL GLASS SM (MISCELLANEOUS) ×2 IMPLANT
DERMABOND ADHESIVE PROPEN (GAUZE/BANDAGES/DRESSINGS) ×1
DERMABOND ADVANCED (GAUZE/BANDAGES/DRESSINGS) ×1
DERMABOND ADVANCED .7 DNX12 (GAUZE/BANDAGES/DRESSINGS) ×1 IMPLANT
DERMABOND ADVANCED .7 DNX6 (GAUZE/BANDAGES/DRESSINGS) ×1 IMPLANT
DIFFUSER DRILL AIR PNEUMATIC (MISCELLANEOUS) ×2 IMPLANT
DRAPE HALF SHEET 40X57 (DRAPES) ×2 IMPLANT
DRAPE INCISE IOBAN 66X45 STRL (DRAPES) IMPLANT
DRAPE INCISE IOBAN 85X60 (DRAPES) ×2 IMPLANT
DRAPE NEUROLOGICAL W/INCISE (DRAPES) ×2 IMPLANT
DRAPE ORTHO SPLIT 77X108 STRL (DRAPES) ×4
DRAPE SURG 17X23 STRL (DRAPES) IMPLANT
DRAPE SURG ORHT 6 SPLT 77X108 (DRAPES) ×2 IMPLANT
DRAPE WARM FLUID 44X44 (DRAPES) ×2 IMPLANT
DRSG OPSITE POSTOP 3X4 (GAUZE/BANDAGES/DRESSINGS) ×6 IMPLANT
DRSG OPSITE POSTOP 4X6 (GAUZE/BANDAGES/DRESSINGS) IMPLANT
DURAPREP 26ML APPLICATOR (WOUND CARE) ×4 IMPLANT
DURAPREP 6ML APPLICATOR 50/CS (WOUND CARE) ×2 IMPLANT
ELECT REM PT RETURN 9FT ADLT (ELECTROSURGICAL) ×2
ELECTRODE REM PT RTRN 9FT ADLT (ELECTROSURGICAL) ×1 IMPLANT
EVACUATOR 1/8 PVC DRAIN (DRAIN) IMPLANT
EVACUATOR SILICONE 100CC (DRAIN) IMPLANT
GAUZE 4X4 16PLY RFD (DISPOSABLE) IMPLANT
GAUZE SPONGE 4X4 12PLY STRL (GAUZE/BANDAGES/DRESSINGS) ×2 IMPLANT
GLOVE BIO SURGEON STRL SZ 6.5 (GLOVE) ×2 IMPLANT
GLOVE BIO SURGEON STRL SZ7.5 (GLOVE) IMPLANT
GLOVE BIOGEL PI IND STRL 6 (GLOVE) ×1 IMPLANT
GLOVE BIOGEL PI IND STRL 7.5 (GLOVE) ×3 IMPLANT
GLOVE BIOGEL PI INDICATOR 6 (GLOVE) ×1
GLOVE BIOGEL PI INDICATOR 7.5 (GLOVE) ×3
GLOVE ECLIPSE 7.0 STRL STRAW (GLOVE) ×8 IMPLANT
GLOVE EXAM NITRILE XL STR (GLOVE) IMPLANT
GOWN STRL REUS W/ TWL LRG LVL3 (GOWN DISPOSABLE) ×2 IMPLANT
GOWN STRL REUS W/ TWL XL LVL3 (GOWN DISPOSABLE) ×1 IMPLANT
GOWN STRL REUS W/TWL 2XL LVL3 (GOWN DISPOSABLE) ×2 IMPLANT
GOWN STRL REUS W/TWL LRG LVL3 (GOWN DISPOSABLE) ×4
GOWN STRL REUS W/TWL XL LVL3 (GOWN DISPOSABLE) ×2
HEMOSTAT POWDER KIT SURGIFOAM (HEMOSTASIS) ×2 IMPLANT
HEMOSTAT SURGICEL 2X14 (HEMOSTASIS) IMPLANT
IV NS 1000ML (IV SOLUTION) ×2
IV NS 1000ML BAXH (IV SOLUTION) ×1 IMPLANT
KIT BASIN OR (CUSTOM PROCEDURE TRAY) ×2 IMPLANT
KIT TURNOVER KIT B (KITS) ×6 IMPLANT
MARKER SKIN DUAL TIP RULER LAB (MISCELLANEOUS) ×4 IMPLANT
NEEDLE HYPO 25X1 1.5 SAFETY (NEEDLE) ×4 IMPLANT
NS IRRIG 1000ML POUR BTL (IV SOLUTION) ×6 IMPLANT
OIL CARTRIDGE MAESTRO DRILL (MISCELLANEOUS) ×2
PACK BATTERY CMF DISP FOR DVR (ORTHOPEDIC DISPOSABLE SUPPLIES) ×2 IMPLANT
PACK CRANIOTOMY CUSTOM (CUSTOM PROCEDURE TRAY) ×2 IMPLANT
PACK LAMINECTOMY NEURO (CUSTOM PROCEDURE TRAY) ×2 IMPLANT
PAD ARMBOARD 7.5X6 YLW CONV (MISCELLANEOUS) ×6 IMPLANT
PASSER CATH 65CM DISP (NEUROSURGERY SUPPLIES) IMPLANT
PATTIES SURGICAL .5 X.5 (GAUZE/BANDAGES/DRESSINGS) IMPLANT
PATTIES SURGICAL .5 X3 (DISPOSABLE) IMPLANT
PATTIES SURGICAL 1X1 (DISPOSABLE) IMPLANT
PENCIL BUTTON HOLSTER BLD 10FT (ELECTRODE) ×2 IMPLANT
SCISSORS LAP 5X35 DISP (ENDOMECHANICALS) IMPLANT
SCREW UNIII AXS SD 1.5X4 (Screw) ×8 IMPLANT
SET CYSTO W/LG BORE CLAMP LF (SET/KITS/TRAYS/PACK) ×2 IMPLANT
SET IRRIG TUBING LAPAROSCOPIC (IRRIGATION / IRRIGATOR) IMPLANT
SET TUBE SMOKE EVAC HIGH FLOW (TUBING) ×2 IMPLANT
SHEATH PERITONEAL INTRO 61 (SHEATH) ×2 IMPLANT
SLEEVE ENDOPATH XCEL 5M (ENDOMECHANICALS) ×2 IMPLANT
SPONGE LAP 4X18 RFD (DISPOSABLE) IMPLANT
SPONGE NEURO XRAY DETECT 1X3 (DISPOSABLE) IMPLANT
SPONGE SURGIFOAM ABS GEL 100 (HEMOSTASIS) ×2 IMPLANT
SPONGE SURGIFOAM ABS GEL SZ50 (HEMOSTASIS) IMPLANT
STAPLER SKIN PROX WIDE 3.9 (STAPLE) ×4 IMPLANT
STAPLER VISISTAT 35W (STAPLE) ×2 IMPLANT
STOCKINETTE 6  STRL (DRAPES)
STOCKINETTE 6 STRL (DRAPES) IMPLANT
STRIP CLOSURE SKIN 1/2X4 (GAUZE/BANDAGES/DRESSINGS) IMPLANT
SUT ETHILON 2 0 FS 18 (SUTURE) ×2 IMPLANT
SUT ETHILON 3 0 FSL (SUTURE) IMPLANT
SUT ETHILON 3 0 PS 1 (SUTURE) ×2 IMPLANT
SUT MNCRL AB 4-0 PS2 18 (SUTURE) ×2 IMPLANT
SUT NURALON 4 0 TR CR/8 (SUTURE) ×2 IMPLANT
SUT SILK 0 TIES 10X30 (SUTURE) ×2 IMPLANT
SUT SILK 3 0 SH 30 (SUTURE) IMPLANT
SUT STEEL 0 (SUTURE)
SUT STEEL 0 18XMFL TIE 17 (SUTURE) IMPLANT
SUT VIC AB 0 CT1 18XCR BRD8 (SUTURE) ×1 IMPLANT
SUT VIC AB 0 CT1 8-18 (SUTURE) ×2
SUT VIC AB 3-0 SH 27 (SUTURE)
SUT VIC AB 3-0 SH 27X BRD (SUTURE) IMPLANT
SUT VIC AB 3-0 SH 8-18 (SUTURE) ×4 IMPLANT
SYR CONTROL 10ML LL (SYRINGE) ×2 IMPLANT
TOWEL GREEN STERILE (TOWEL DISPOSABLE) ×4 IMPLANT
TOWEL GREEN STERILE FF (TOWEL DISPOSABLE) ×2 IMPLANT
TRAY FOLEY MTR SLVR 16FR STAT (SET/KITS/TRAYS/PACK) ×2 IMPLANT
TRAY LAPAROSCOPIC MC (CUSTOM PROCEDURE TRAY) ×2 IMPLANT
TROCAR XCEL BLUNT TIP 100MML (ENDOMECHANICALS) IMPLANT
TROCAR XCEL NON-BLD 5MMX100MML (ENDOMECHANICALS) ×2 IMPLANT
TUBE CONNECTING 12X1/4 (SUCTIONS) ×4 IMPLANT
UNDERPAD 30X36 HEAVY ABSORB (UNDERPADS AND DIAPERS) ×4 IMPLANT
WATER STERILE IRR 1000ML POUR (IV SOLUTION) ×2 IMPLANT

## 2020-03-18 NOTE — Consult Note (Signed)
Reason for Consult/Chief Complaint: need for ventriculoperitoneal shunt placement Consultant: Kathyrn Sheriff, MD  Legacy Kuznik is an 64 y.o. female.   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 Patient is trached and non-verbal and history is obtained from chart review.   History reviewed. No pertinent past medical history.  Past Surgical History:  Procedure Laterality Date  . CRANIOTOMY Left 01/30/2020   Procedure: LEFT FAR LATERAL CRANIOTOMY FOR ANEURYSM CLIPPING;  Surgeon: [name], MD;  Location: [facility];  Service: Neurosurgery;  Laterality: Left;  . DIRECT LARYNGOSCOPY N/A 02/29/2020   Procedure: DIRECT LARYNGOSCOPY;  Surgeon: [name], MD;  Location: [facility];  Service: ENT;  Laterality: N/A;  . IR ANGIO INTRA EXTRACRAN SEL INTERNAL CAROTID BILAT MOD SED  01/30/2020  . IR ANGIO VERTEBRAL SEL VERTEBRAL UNI L MOD SED  01/30/2020  . IR GASTROSTOMY TUBE MOD SED  02/22/2020  . RADIOLOGY WITH ANESTHESIA N/A 01/30/2020   Procedure: IR WITH ANESTHESIA;  Surgeon: [name], MD;  Location: [facility];  Service: Radiology;  Laterality: N/A;  . THYROIDECTOMY N/A 02/08/2020   Procedure: THYROIDECTOMY;  Surgeon: [name], MD;  Location: [place];  Service: ENT;  Laterality: N/A;  . TRACHEOSTOMY TUBE PLACEMENT N/A 02/08/2020   Procedure: TRACHEOSTOMY;  Surgeon: [name], MD;  Location: [place];  Service: ENT;  Laterality: N/A;  . TRACHEOSTOMY TUBE PLACEMENT N/A 02/29/2020   Procedure: TRACHEOSTOMY EXCHANGE;  Surgeon: [name], MD;  Location: [place];  Service: ENT;  Laterality: N/A;    History reviewed. No pertinent family history.  Social History:  has no history on file for tobacco use, alcohol use, and drug use.  Allergies: No Known Allergies  Medications: I have reviewed the patient's current medications.  Results for orders placed or performed during the hospital encounter of 01/30/20  (from the past 48 hour(s))  Glucose, capillary     Status: Abnormal   Collection Time: 03/16/20 11:34 AM  Result Value Ref Range   Glucose-Capillary 126 (H) 70 - 99 mg/dL    Comment: Glucose reference range applies only to samples taken after fasting for at least 8 hours.  Glucose, capillary     Status: None   Collection Time: 03/16/20  3:29 PM  Result Value Ref Range   Glucose-Capillary 79 70 - 99 mg/dL    Comment: Glucose reference range applies only to samples taken after fasting for at least 8 hours.  Glucose, capillary     Status: Abnormal   Collection Time: 03/16/20  7:21 PM  Result Value Ref Range   Glucose-Capillary 156 (H) 70 - 99 mg/dL    Comment: Glucose reference range applies only to samples taken after fasting for at least 8 hours.  Surgical pcr screen     Status: None   Collection Time: 03/16/20  7:51 PM   Specimen: Nasal Mucosa; Nasal Swab  Result Value Ref Range   MRSA, PCR NEGATIVE NEGATIVE   Staphylococcus aureus NEGATIVE NEGATIVE    Comment: (NOTE) The Xpert SA Assay (FDA approved for NASAL specimens in patients 9 years of age and older), is one component of a comprehensive surveillance program. It is not intended to diagnose infection nor to guide or monitor treatment. Performed at [facility], [place] [address], [place], [place] [address]   Glucose, capillary     Status: Abnormal   Collection Time: 03/16/20 11:05 PM  Result Value Ref Range   Glucose-Capillary  134 (H) 70 - 99 mg/dL    Comment: Glucose reference range applies only to samples taken after fasting for at least 8 hours.  Glucose, capillary     Status: Abnormal   Collection Time: 03/17/20  3:18 AM  Result Value Ref Range   Glucose-Capillary 68 (L) 70 - 99 mg/dL    Comment: Glucose reference range applies only to samples taken after fasting for at least 8 hours.  Glucose, capillary     Status: None   Collection Time: 03/17/20  4:04 AM  Result Value Ref Range   Glucose-Capillary 98 70 - 99  mg/dL    Comment: Glucose reference range applies only to samples taken after fasting for at least 8 hours.  Glucose, capillary     Status: Abnormal   Collection Time: 03/17/20  7:40 AM  Result Value Ref Range   Glucose-Capillary 112 (H) 70 - 99 mg/dL    Comment: Glucose reference range applies only to samples taken after fasting for at least 8 hours.  Glucose, capillary     Status: Abnormal   Collection Time: 03/17/20 11:11 AM  Result Value Ref Range   Glucose-Capillary 112 (H) 70 - 99 mg/dL    Comment: Glucose reference range applies only to samples taken after fasting for at least 8 hours.  Glucose, capillary     Status: Abnormal   Collection Time: 03/17/20  3:47 PM  Result Value Ref Range   Glucose-Capillary 107 (H) 70 - 99 mg/dL    Comment: Glucose reference range applies only to samples taken after fasting for at least 8 hours.  Glucose, capillary     Status: Abnormal   Collection Time: 03/17/20  7:19 PM  Result Value Ref Range   Glucose-Capillary 107 (H) 70 - 99 mg/dL    Comment: Glucose reference range applies only to samples taken after fasting for at least 8 hours.  Glucose, capillary     Status: Abnormal   Collection Time: 03/17/20 11:09 PM  Result Value Ref Range   Glucose-Capillary 139 (H) 70 - 99 mg/dL    Comment: Glucose reference range applies only to samples taken after fasting for at least 8 hours.  Glucose, capillary     Status: Abnormal   Collection Time: 03/18/20  3:19 AM  Result Value Ref Range   Glucose-Capillary 113 (H) 70 - 99 mg/dL    Comment: Glucose reference range applies only to samples taken after fasting for at least 8 hours.  Glucose, capillary     Status: Abnormal   Collection Time: 03/18/20  7:19 AM  Result Value Ref Range   Glucose-Capillary 113 (H) 70 - 99 mg/dL    Comment: Glucose reference range applies only to samples taken after fasting for at least 8 hours.    MR MRV HEAD W WO CONTRAST  Result Date: 03/16/2020 CLINICAL DATA:   Right-sided subdural hematoma.  VP shunt. EXAM: MR VENOGRAM HEAD WITHOUT AND WITH CONTRAST TECHNIQUE: Angiographic images of the intracranial venous structures were obtained using MRV technique without and with intravenous contrast. CONTRAST:  23mL GADAVIST GADOBUTROL 1 MMOL/ML IV SOLN COMPARISON:  Multiple previous neuro imaging studies, most recently head CT yesterday and brain MRI 03/12/2020. previous CT angiography 01/30/2020 FINDINGS: Superior sagittal sinus is patent. Left transverse sinus is patent and normal. Right transverse sinus is a diminutive structure, similar in appearance to the previous CT angiography. No suspicion of actual thrombosis. Sigmoid sinuses and jugular veins are patent, the vessels on the right being tiny on a  developmental basis. No evidence of thrombosis of the deep veins structures. No superficial venous thrombosis is seen. Convexity subdural hematoma on the right as seen previously, maximal thickness 2.1 cm. Thin subdural collections in the posterior fossa as well, without apparent change. Dural thickening and enhancement right more than left, presumably secondary to the subdurals. IMPRESSION: 1. No evidence of intracranial venous thrombosis. Diminutive right transverse sinus, similar in appearance to the previous CT angiography of 01/30/2020. 2. Convexity subdural hematoma on the right, maximal thickness 2.1 cm, not significantly changed. Thin subdural collections in the posterior fossa as well, without apparent change. Electronically Signed   By: Nelson Chimes M.D.   On: 03/16/2020 14:14    ROS 10 point review of systems is negative except as listed above in HPI.   Physical Exam Blood pressure (!) 160/95, pulse 77, temperature 97.8 F (36.6 C), temperature source Axillary, resp. rate 18, height 5\' 9"  (1.753 m), weight 64 kg, SpO2 96 %. Constitutional: well-developed, well-nourished HEENT: moist conjunctiva, external inspection of ears and nose normal, hearing intact, EVD in  place on R Oropharynx: normal oropharyngeal mucosa, poor dentition Neck: no thyromegaly, trachea midline, no midline cervical tenderness to palpation Chest: breath sounds equal bilaterally, normal respiratory effort, no midline or lateral chest wall tenderness to palpation/deformity, trached Abdomen: soft, NT, no bruising, no hepatosplenomegaly, PEG in place, just left of midline GU: normal female genitalia  Back: no wounds, no thoracic/lumbar spine tenderness to palpation, no thoracic/lumbar spine stepoffs Rectal: deferred Extremities: 2+ radial and pedal pulses bilaterally, motor and sensation of bilateral UE and LE unable to be assessed, no peripheral edema MSK: unable to assess gait/station, no clubbing/cyanosis of fingers/toes Skin: warm, dry, no rashes    Assessment/Plan: 69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 Plan for VP shunt today. I will perform the peritoneal portion of the procedure. Called daughter to obtain consent, no answer, left VM. Will try again later.    Jesusita Oka, MD General and [facility]

## 2020-03-18 NOTE — Transfer of Care (Signed)
Immediate Anesthesia Transfer of Care Note  Patient: Stacy Moran  Procedure(s) Performed: RIGHT SHUNT INSERTION VENTRICULAR-PERITONEAL/ BURR HOLE Evacuation of Subdural Hematoma (N/A ) BURR HOLES (N/A ) LAPAROSCOPIC REVISION VENTRICULAR-PERITONEAL (V-P) SHUNT (N/A )  Patient Location: PACU  Anesthesia Type:General  Level of Consciousness: awake and patient cooperative  Airway & Oxygen Therapy: Patient Spontanous Breathing and Patient connected to tracheostomy mask oxygen  Post-op Assessment: Report given to RN and Post -op Vital signs reviewed and stable  Post vital signs: Reviewed and stable  Last Vitals:  Vitals Value Taken Time  BP 171/86 03/18/20 1714  Temp    Pulse 70 03/18/20 1715  Resp 14 03/18/20 1715  SpO2 100 % 03/18/20 1715  Vitals shown include unvalidated device data.  Last Pain:  Vitals:   03/18/20 1200  TempSrc: Axillary  PainSc:       Patients Stated Pain Goal: 0 (25/27/12 9290)  Complications: No complications documented.

## 2020-03-18 NOTE — Op Note (Signed)
NEUROSURGERY OPERATIVE NOTE   PREOP DIAGNOSIS:  Communicating hydrocephalus Chronic subdural hematoma   POSTOP DIAGNOSIS: Same  PROCEDURE STAGE 1: Laparoscopic assisted insertion of ventriculoperitoneal shunt  PROCEDURE STAGE 2: 1.  Bur hole evacuation of chronic subdural hematoma  SURGEON: Dr. [name], MD  CO-SURGEON STAGE 1: Dr. Michaelle Birks, MD  [place] 2: None  ANESTHESIA: General Endotracheal  EBL: Minimal  SPECIMENS: None  DRAINS: None  COMPLICATIONS: None immediate  CONDITION: Hemodynamically stable to PACU  SHUNT: Med[employer] Strata II set to 1.5  HISTORY: Stacy Moran is a 64 y.o. female initially admitted to the hospital nearly 2 months ago with subarachnoid hemorrhage.  She underwent clipping of a left PICA aneurysm.  She initially was weaned from her external ventricular drain and appeared to be neurologically stable.  She was monitored on the general neuroscience floor but noted to be progressively more somnolent.  Repeat scan demonstrated ventriculomegaly consistent with delayed onset hydrocephalus.  She was transferred to the intensive care unit and ventricular drain was replaced with significant improvement in her mental status.  Placement of a ventriculoperitoneal shunt was therefore indicated.  PROCEDURE IN DETAIL: The patient was brought to the operating room via stretcher. After induction of general anesthesia, the patient was positioned on the operative table in the supine position. All pressure points were meticulously padded.  Skin of the right frontal, parietal, and retromastoid scalp as well as the right neck, chest, and abdomen were all prepped and draped in the usual sterile fashion.  After timeout was conducted, the previous incision for the external ventricular drain was opened sharply.  The ventricular drain was then identified and clamped.  The drain was then cut and the external portion of the drain removed.  A pocket was  then created for the shunt valve.  The valve and distal catheter were then passed using a counterincision in the retroauricular region.  Once the valve was seated subcutaneously, the ventricular catheter was removed and the shunt ventricular catheter was passed in a single attempt with good spontaneous flow of clear CSF under some pressure.  This was connected to the valve snap assembly.  The shunt was tested and noted to be flowing well.  Shunt passer was then used to create a subcutaneous tunnel from the retroauricular incision into the right upper quadrant.  The distal catheter was then passed into the right upper quadrant.  The distal catheter was then placed into the peritoneal space under laparoscopic guidance, details of which are dictated separately by Dr. Zenia Resides.  Once the distal catheter was positioned in the peritoneal space, it was directly visualized and noted to be flowing well by pumping the shunt reservoir.  The scalp incisions were then irrigated with normal saline irrigation.  They were closed in layers using interrupted 3-0 Vicryl stitches and the skin was closed with staples.  At this point attention was then turned to evacuation of the subdural hematoma.  A linear incision along the right parietal boss was made sharply.  This was carried down through the galea.  Raney clips were applied for hemostasis on the skin.  Periostial elevators were then used to elevate the pericranium.  Self-retaining retractor was placed.  2 bur holes were then created along the parietal boss.  The dura was then coagulated and immediately subjacent to the dura noted a thick subdural membrane.  This was also opened with the monopolar electrocautery.  Thin chronic subdural fluid was then encountered and evacuated under some pressure.  A 10  French red rubber catheter was then connected to a 1 L saline bag and the subdural space was irrigated with 1 L of saline.  No further chronic subdural fluid was noted, and the  irrigant was clear.  The bur holes were then packed with a small amount of Gelfoam.  Bur hole covers were placed and secured with titanium screws.  The wound was then irrigated with normal saline.  Raney clips were removed and hemostasis secured with bipolar electrocautery.  The galea was closed with interrupted 3-0 Vicryl stitches and the skin was closed with staples.  Bacitracin ointment and sterile dressing was then applied to all the incisions.  At the end of the case all sponge, needle, instrument, and cottonoid counts were correct.  Patient was then transferred to the stretcher and taken to the postanesthesia care unit in stable hemodynamic condition.    [name], MD [facility]

## 2020-03-18 NOTE — Anesthesia Postprocedure Evaluation (Signed)
Anesthesia Post Note  Patient: Stacy Moran  Procedure(s) Performed: RIGHT SHUNT INSERTION VENTRICULAR-PERITONEAL/ BURR HOLE Evacuation of Subdural Hematoma (N/A ) BURR HOLES (N/A ) LAPAROSCOPIC REVISION VENTRICULAR-PERITONEAL (V-P) SHUNT (N/A )     Patient location during evaluation: PACU Anesthesia Type: General Level of consciousness: awake Pain management: pain level controlled Vital Signs Assessment: post-procedure vital signs reviewed and stable Respiratory status: patient connected to tracheostomy mask oxygen Cardiovascular status: stable Postop Assessment: no apparent nausea or vomiting Anesthetic complications: no   No complications documented.  Last Vitals:  Vitals:   03/18/20 1711 03/18/20 1714  BP: (!) 175/140 (!) 171/86  Pulse: 71 64  Resp: 18 12  Temp: (!) 36.2 C   SpO2: 100% 100%    Last Pain:  Vitals:   03/18/20 1200  TempSrc: Axillary  PainSc:                  Avo Schlachter

## 2020-03-18 NOTE — Progress Notes (Signed)
PT Cancellation Note  Patient Details Name: Stacy Moran MRN: [id number] DOB: 04-12-1955   Cancelled Treatment:    Reason Eval/Treat Not Completed: Patient at procedure or test/unavailable - plan for VP shunt today, PT to check back as schedule allows.  [name], PT Acute Rehabilitation Services Pager [phone]  Office [phone]    Louis Matte 03/18/2020, 11:42 AM

## 2020-03-18 NOTE — Progress Notes (Signed)
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  Required tracheostomy for airway protection.  No significant past medical history.  Significant Hospital Events   11/03 Admitted 11/04 craniotomy with clipping of left PICA 11/12 total thyroidectomy, tracheostomy 11/13 start ABx for fever and tracheobronchitis 11/16 Now 11 days post clipping.  Drain stopped yesterday so left clamped.  Was interactive with PT yesterday. This morning, agitated and complaining of back pain. More somnolent following fentanyl this morning. Stable from neuro check  11/21 pulm called back again;  trach bleeding. we called ENT 11/23 subglottic obstruction which is new observed on MBS-->CT neck ordered to r/o infection. Also abx started. As she failed MBS IR consulted for PEG. Should be last day for nimodipine. We asked IM to assist w. Care  12/04  Put on mechanical ventilation and transferred to ICU 12/06 tolerating PS 12/15 seizure prompting MRI showing worsening hydrocephalus 12/16 transferred to ICU for bedside ventriculostomy  Consults:  ENT (Dr.Rosen) Palliative care Neurology >> s/o 12/17  Procedures:  11/03 ETT >>11/09 11/03 EVD >> 11/09 ETT >> 11/12 11/12 Tracheostomy >> 12/16 Ventriculostomy >>  Significant Diagnostic Tests:   11/3 CT head > large volume subarachnoid related to ruptured left PICA aneurysm, intraventricular reflux with hydrocephalus, chronic small vessel disease, chronic small vessel disease  11/9 H&N U/S > 2.3x2.3x2.4 mass in the left neck along the left thyroid lobe  11/27 CT head > Resolution of subarachnoid hemorrhage, stable subdural hematoma  11/29 CT head > Unchanged size of right subdural hematoma with minimal leftward midline shift  12/15 MRI  brain > 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 Redemonstration of a right cerebral convexity fluid collection which measures up to 1.6 cm, which is likely increased from prior when comparing across modalities. Leftward midline shift is also slightly increased, now measuring approximately 6 mm. Interval development of obstructive hydrocephalus  12/19 MRV head > no venous thrombosis, diminutive Rt transverse sinus, Rt SDH 2.1 cm, thin subdural collections posterior fossa  Micro Data:  11/03 SARS2/ Flu > neg 11/08 respiratory culture >> negative 11/08 blood culture >>negative 11/11 urine culture >> multiple species 11/12 MRSA PCR >> negative 11/12 sputum: staph aureus, Citrobacter and Haemophilus parainfluenza (B lactamase +)  11/22 sputum >> multiple organism, no staph or strep 12/4 Sputum>> negative and gram-positive rods 12/4 wound trach site > neg 12/8 sputum > MSSA  Antimicrobials:  Zosyn 11/13 >> 11/14 Ceftriaxone 11/14 - 11/20 Ancef 12/13 > 12/19  Interim history/subjective:  Headache better.  Objective   Blood pressure (!) 151/82, pulse 87, temperature 97.8 F (36.6 C), temperature source Axillary, resp. rate 20, height 5\' 9"  (1.753 m), weight 64 kg, SpO2 94 %.    FiO2 (%):  [21 %] 21 %   Intake/Output Summary (Last 24 hours) at 03/18/2020 0748 Last data filed at 03/17/2020 2000 Gross per 24 hour  Intake 210 ml  Output 498 ml  Net -288 ml   Filed Weights   03/06/20 0500 03/10/20 0500 03/11/20 0500  Weight: 59.7 kg 63.6 kg 64 kg    Examination:  General - alert Eyes - pupils reactive ENT - trach site clean Cardiac - regular rate/rhythm, no murmur Chest - equal breath sounds b/l, no  wheezing or rales Abdomen - soft, non tender, + bowel sounds Extremities - no cyanosis, clubbing, or edema Skin - no rashes Neuro - follows commands, moves extremities  Resolved Hospital Problem list    Recurrent tracheobronchitis-recurrent from  Beta lactamase + H flu, Citrobacter and S.aureus are susceptible.: Patient completed 7 days treatment with ceftriaxone, Trach site bleeding   Assessment & Plan:   Compromised airway s/p tracheostomy. - trach care - will need to re-consult speech therapy after VP shunt  SAH from ruptured PICA complicated by hydrocephalus s/p clipping. Atypical hygromas. - for VP shunt and drainage of SDH on 12/20  Seizures - continue keppra  Agitation, insomnia. 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 - continue synthroid  Hyperglycemia w/o history of DM. - SSI  HTN - goal SBP < 160 - continue amlodipine, hydralazine, labetalol  Anemia of critical illness. - f/u CBC intermittently - transfuse for Hb < 7 or significant bleeding  Goals of care. - see by palliative care (last on 12/17) - continue aggressive therapy  Best Practice:  Nutrition: tube feeds DVT prophylaxis: SQ heparin SUP: protonix Mobility: PT/OT >> recommending SNF when able Code status: full code Disposition: ICU  Labs:   CMP Latest Ref Rng & Units 03/15/2020 03/14/2020 03/14/2020  Glucose 70 - 99 mg/dL 150(H) - 140(H)  BUN 8 - 23 mg/dL 23 - 21  Creatinine 0.44 - 1.00 mg/dL 0.52 - 0.46  Sodium 135 - 145 mmol/L 138 - 135  Potassium 3.5 - 5.1 mmol/L 4.1 4.4 6.0(H)  Chloride 98 - 111 mmol/L 100 - 99  CO2 22 - 32 mmol/L 27 - 20(L)  Calcium 8.9 - 10.3 mg/dL 9.6 - 9.1  Total Protein 6.5 - 8.1 g/dL - - -  Total Bilirubin 0.3 - 1.2 mg/dL - - -  Alkaline Phos 38 - 126 U/L - - -  AST 15 - 41 U/L - - -  ALT 0 - 44 U/L - - -    CBC Latest Ref Rng & Units 03/15/2020 03/14/2020 03/13/2020  WBC 4.0 - 10.5 K/uL 6.1 6.7 8.1  Hemoglobin 12.0 - 15.0 g/dL 10.1(L) 10.2(L) 10.7(L)  Hematocrit 36.0 - 46.0 % 33.3(L) 33.3(L) 33.7(L)  Platelets 150 - 400 K/uL 277 270 267    ABG    Component Value Date/Time   PHART 7.444 02/06/2020 0843   PCO2ART  42.8 02/06/2020 0843   PO2ART 157 (H) 02/06/2020 0843   HCO3 29.2 (H) 02/06/2020 0843   TCO2 30 02/06/2020 0843   ACIDBASEDEF 4.0 (H) 01/31/2020 0113   O2SAT 99.0 02/06/2020 0843    CBG (last 3)  Recent Labs    03/17/20 2309 03/18/20 0319 03/18/20 0719  GLUCAP 139* 113* 113*    Lab Results  Component Value Date   TSH 0.118 (L) 02/07/2020    Lab Results  Component Value Date   HGBA1C 6.2 (H) 01/30/2020    Signature:  Chesley Mires, MD [place] Pager - [phone] 03/18/2020, 7:48 AM

## 2020-03-18 NOTE — Progress Notes (Signed)
OT Cancellation Note  Patient Details Name: Stacy Moran MRN: [id number] DOB: [birth year]   Cancelled Treatment:    Reason Eval/Treat Not Completed: Patient at procedure or test/ unavailable (for VP shunt today), will follow up for OT treatment as able.  Lou Cal, OT Acute Rehabilitation Services Pager [phone] Office [phone]   Raymondo Band 03/18/2020, 1:04 PM

## 2020-03-18 NOTE — Op Note (Signed)
Date: 03/18/20  Patient: Stacy Moran MRN: [id number]  Preoperative Diagnosis: Subarachnoid hemorrhage with hydrocephalus Postoperative Diagnosis: Same  Procedure: Laparoscopic assistance with ventriculoperitoneal shunt placement  Surgeon: Michaelle Birks, MD Co-Surgeon: [name], MD  EBL: Minimal  Anesthesia: General  Specimens: None  Indications: 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 She has developed hydrocephalus. General surgery was consulted for assistance with placement of a VP shunt by neurosurgery.  Findings: VP shunt catheter tip placed through the RUQ abdominal wall with the catheter tip positioned in the pelvis. CSF flowed through the catheter tip after placement of the shunt.  Procedure details: Informed consent was obtained from the patient's family prior to the procedure. The patient was brought to the operating room and placed on the table in the supine position. General anesthesia was induced and appropriate lines and drains were placed for intraoperative monitoring. Perioperative antibiotics were administered per SCIP guidelines. The patient was prepped and draped in the usual sterile fashion. A pre-procedure timeout was taken verifying patient identity, surgical site and procedure to be performed.  A small infraumbilical skin incision was made, the umbilical stalk was grasped and elevated, and a Veress needle was inserted. Intraperitoneal placement was confirmed with the saline drop test and the abdomen was insufflated to a pressure of 58mmHg. A 58mm visiport was placed. The peritoneal cavity was inspected with no evidence of visceral or vascular injury. The stomach was adherent to the abdominal wall at the site of the G tube, but there were no intraabdominal adhesions. A 26mm port was placed in the RLQ under direct visualization. The shunt catheter was tunneled down the patient's right chest wall onto the  right upper quadrant abdominal wall by Dr. Kathyrn Sheriff. A small skin incision was made to bring the catheter tip through the skin at this point. The subcutaneous tissue was divided, and a stab incision was made through the abdominal wall at this point under direct laparoscopic visualization. The catheter tip was inserted into the peritoneal cavity and a grasper was used to pull the catheter down into the pelvis. Once the catheter was in place, the CSF was visualized flowing freely from the catheter tip. The ports were removed and the abdomen was desufflated. The skin at the port sites and over the catheter entry site into the abdomen was closed with subcuticular 4-0 monocryl, taking care not to injure the catheter. Dermabond was applied.  All counts were correct x2 at the end of the procedure. Please see Dr. Cleotilde Neer separately dictated operative note for further details of the neurosurgical portion of the procedure.  Michaelle Birks, MD 03/18/20 4:31 PM

## 2020-03-18 NOTE — Anesthesia Preprocedure Evaluation (Addendum)
Anesthesia Evaluation  Patient identified by MRN, date of birth, ID band Patient awake    Reviewed: Allergy & Precautions, NPO status , Patient's Chart, lab work & pertinent test results  Airway Mallampati: II  TM Distance: >3 FB     Dental   Pulmonary    breath sounds clear to auscultation       Cardiovascular  Rhythm:Regular Rate:Normal     Neuro/Psych History noted CG    GI/Hepatic negative GI ROS, Neg liver ROS,   Endo/Other  negative endocrine ROS  Renal/GU negative Renal ROS     Musculoskeletal   Abdominal   Peds  Hematology   Anesthesia Other Findings   Reproductive/Obstetrics                             Anesthesia Physical Anesthesia Plan  ASA: III  Anesthesia Plan: General   Post-op Pain Management:    Induction: Intravenous  PONV Risk Score and Plan: 3 and Ondansetron, Dexamethasone and Midazolam  Airway Management Planned: Oral ETT  Additional Equipment: Arterial line  Intra-op Plan:   Post-operative Plan: Possible Post-op intubation/ventilation  Informed Consent: I have reviewed the patients History and Physical, chart, labs and discussed the procedure including the risks, benefits and alternatives for the proposed anesthesia with the patient or authorized representative who has indicated his/her understanding and acceptance.     Dental advisory given  Plan Discussed with: CRNA and Anesthesiologist  Anesthesia Plan Comments:        Anesthesia Quick Evaluation

## 2020-03-18 NOTE — Progress Notes (Signed)
  NEUROSURGERY PROGRESS NOTE   No issues overnight.   EXAM:  BP (!) 147/77   Pulse 79   Temp 97.8 F (36.6 C) (Axillary)   Resp 18   Ht 5\' 9"  (1.753 m)   Wt 64 kg   SpO2 99%   BMI 20.84 kg/m   Awake, alert,  Nods to questions, mouths responses MAE well EVD in place, clamped  IMPRESSION:  64 y.o. female s/p SAH with delayed HCP Enlargement of chronic right parietal SDH  PLAN: - Will proceed with lap assisted VPS and concurrent evacuation of right SDH   [name], MD [facility]

## 2020-03-18 NOTE — Progress Notes (Signed)
Spoke with patient's daughter, discussed risks/benefits, all questions answered. Informed consent provided.   Jesusita Oka, MD [facility]

## 2020-03-19 ENCOUNTER — Inpatient Hospital Stay (HOSPITAL_COMMUNITY): Payer: Self-pay

## 2020-03-19 LAB — GLUCOSE, CAPILLARY
Glucose-Capillary: 187 mg/dL — ABNORMAL HIGH (ref 70–99)
Glucose-Capillary: 160 mg/dL — ABNORMAL HIGH (ref 70–99)
Glucose-Capillary: 130 mg/dL — ABNORMAL HIGH (ref 70–99)
Glucose-Capillary: 99 mg/dL (ref 70–99)
Glucose-Capillary: 176 mg/dL — ABNORMAL HIGH (ref 70–99)
Glucose-Capillary: 128 mg/dL — ABNORMAL HIGH (ref 70–99)

## 2020-03-19 IMAGING — CT CT HEAD W/O CM
4 series · 15 of 47 positions shown, 17 images · non-contrast
Comparison: [DATE]

CLINICAL DATA: Subdural hemorrhage.

EXAM:
CT HEAD WITHOUT CONTRAST
TECHNIQUE: Contiguous axial images were obtained from the base of the skull
through the vertex without intravenous contrast.

[Series 3: head without · axial · non-contrast · 0.50mm/px · z∈[-105,+40]mm · 7 of 39 slices shown, 9 images]
[im 5/39  brain]
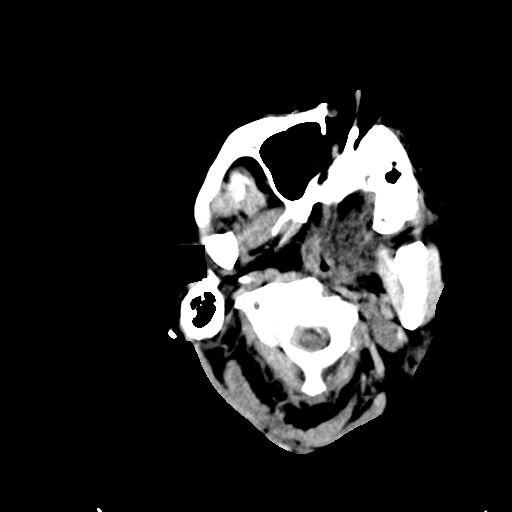
[im 5/39  bone]
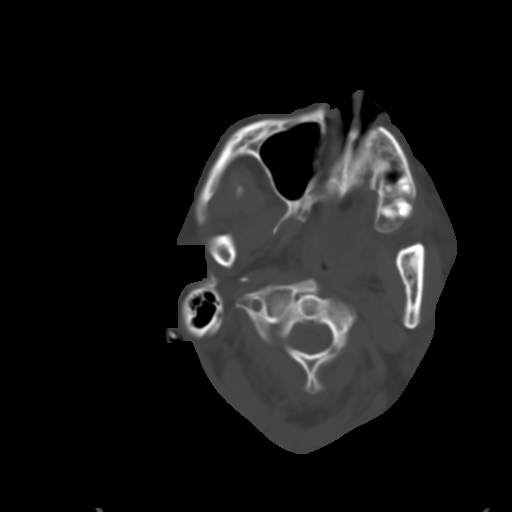
[im 10/39  brain]
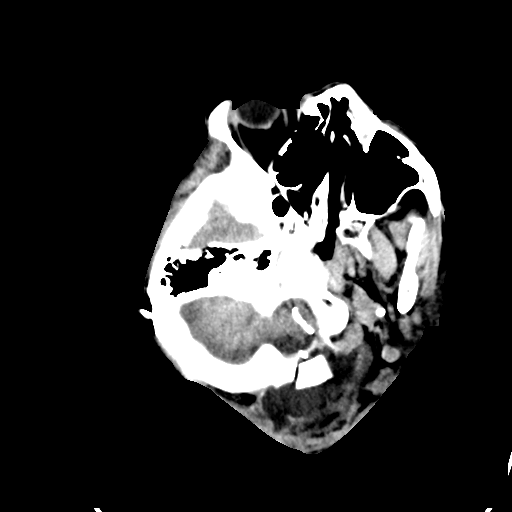
[im 15/39  brain]
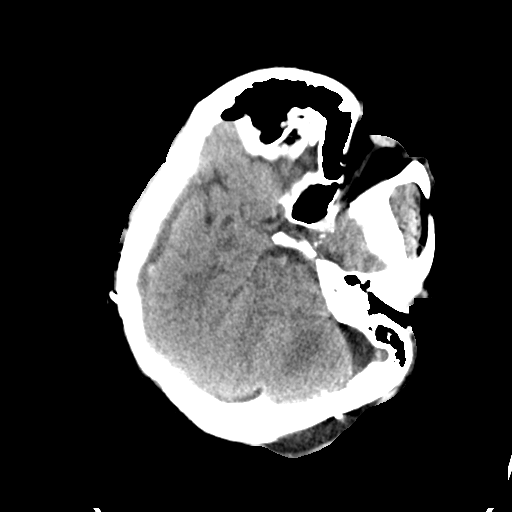
[im 20/39  brain]
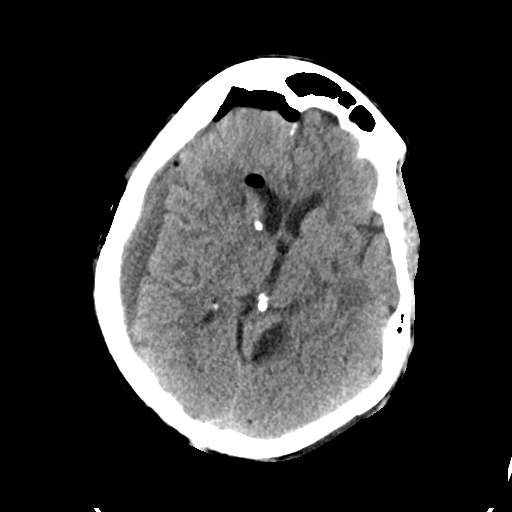
[im 24/39  brain]
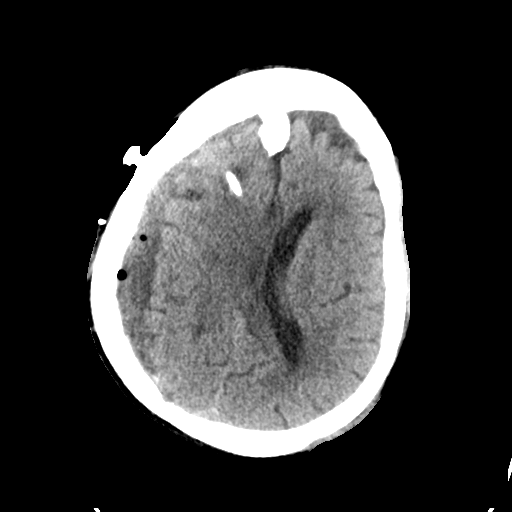
[im 24/39  bone]
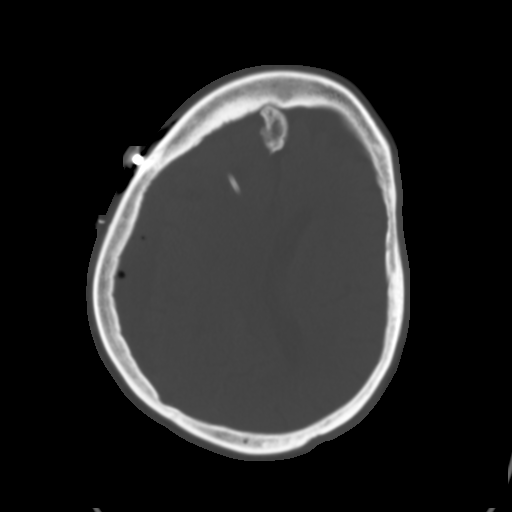
[im 29/39  brain]
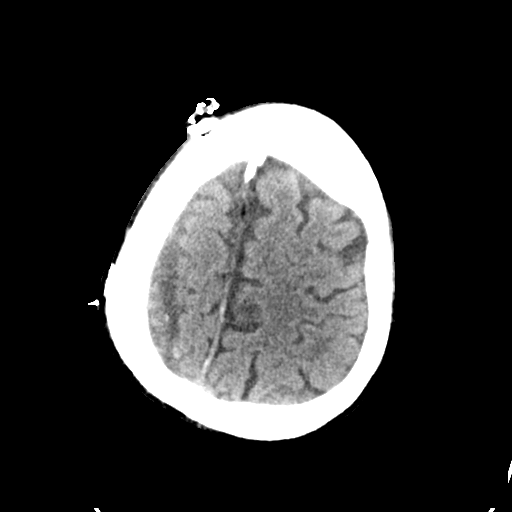
[im 34/39  brain]
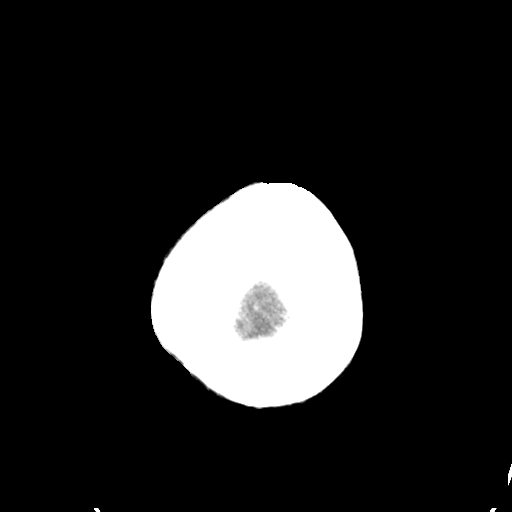

[Series 4: head bone · axial · 0.50mm/px · z∈[-107,-87]mm · 2 of 96 slices shown]
[im 10/96  bone]
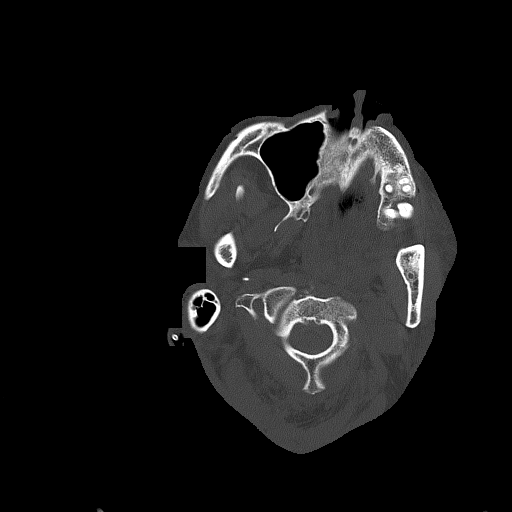
[im 20/96  bone]
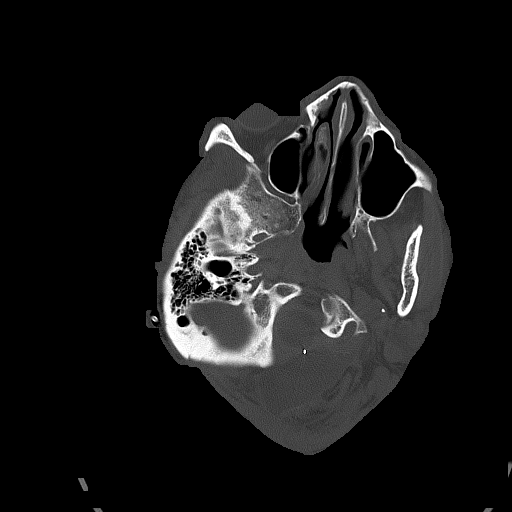

[Series 5: head without cor · coronal · non-contrast · 0.36mm/px · 3 of 74 slices shown]
[im 25/74  brain]
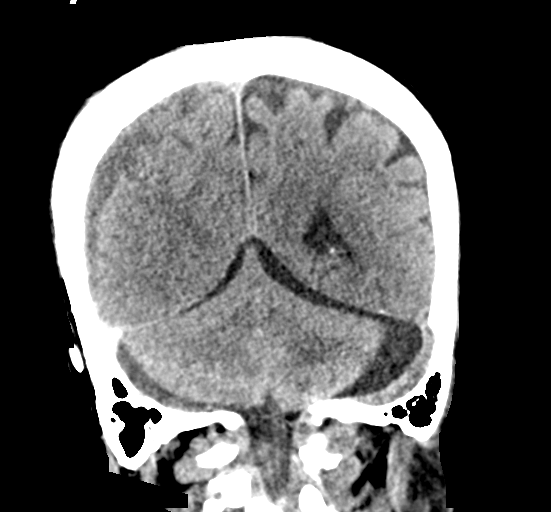
[im 33/74  brain]
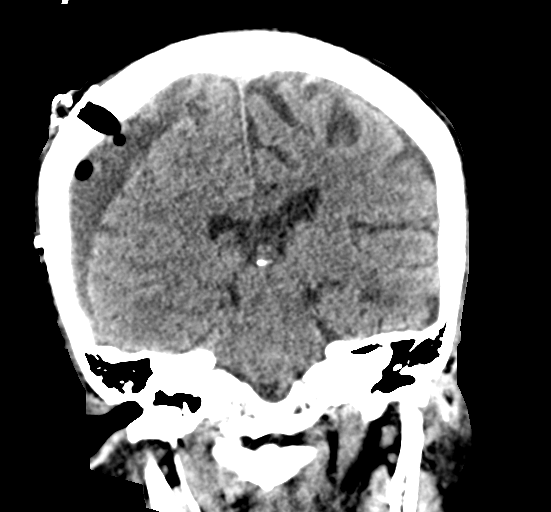
[im 41/74  brain]
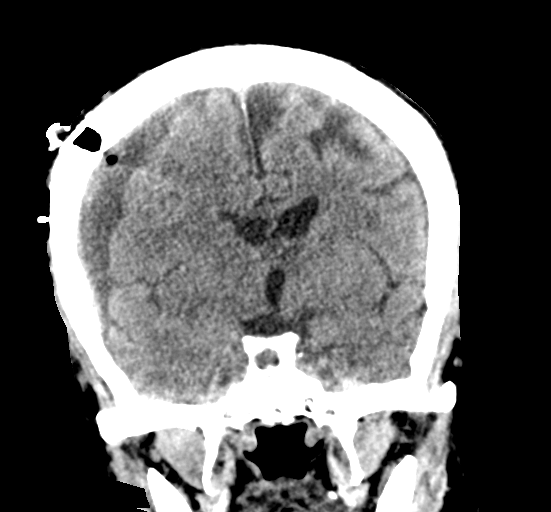

[Series 6: head without sag · sagittal · non-contrast · 0.34mm/px · 3 of 66 slices shown]
[im 28/66  brain]
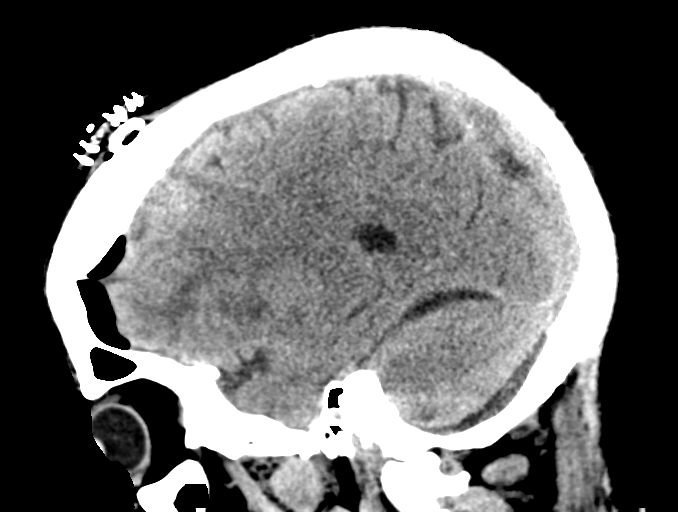
[im 33/66  brain]
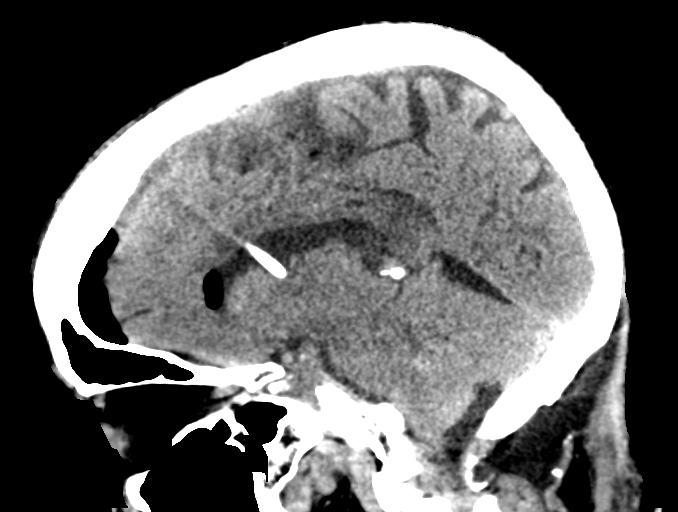
[im 38/66  brain]
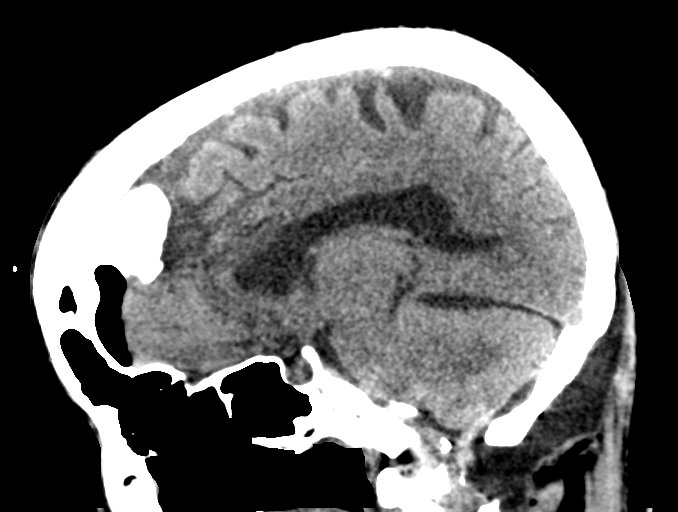

[15 of 47 positions shown; findings below may reference images not displayed]

FINDINGS: Brain: A right frontal approach ventriculostomy catheter terminates
in the right frontal horn. The lateral and third ventricles are less
effaced/collapsed than on the prior CT. A subdural hematoma over the
right cerebral convexity has mildly decreased in size, now measuring
1.8 cm in maximal thickness (previously 2.0 cm). There is increased
small volume gas within the subdural collection, and there is also a
small amount of gas in the frontal horn of the right lateral
ventricle. Leftward midline shift has mildly decreased and now
measures 4 mm (previously 6 mm). Small volume low-density
extra-axial fluid in the posterior fossa, most notable lateral to
the left cerebellar hemisphere, is unchanged. The basilar cisterns
remain partially effaced. No acute large territory infarct is
identified. The small infarcts primarily involving white matter of
the right frontal and parietal lobe and corpus callosum on the
recent prior MRI are not well demonstrated by CT. There is a
background of moderate chronic small vessel ischemia in the cerebral
white matter with a chronic lacunar infarct in the right basal
ganglia.

Vascular: Calcified atherosclerosis at the skull base. Left PICA
aneurysm clipping.

Skull: Left suboccipital craniotomy with similar appearance of an
associated pseudomeningocele. Right parietal burr holes.

Sinuses/Orbits: Paranasal sinuses and mastoid air cells are clear.
Left ocular prosthesis.

Other: Right-sided scalp skin staples and mild soft tissue swelling.
Gas within the right-sided scalp and included upper neck soft
tissues along the course of the new VP shunt catheter.
IMPRESSION: 1. Mildly decreased size of right cerebral convexity subdural
hematoma with mildly decreased midline shift.
2. Decreased effacement/collapse of the lateral and third
ventricles. Interval VP shunt placement with increased
pneumocephalus.

## 2020-03-19 MED ORDER — CHLORHEXIDINE GLUCONATE 0.12 % MT SOLN
OROMUCOSAL | Status: AC
  Administered 2020-03-19: 15 mL via OROMUCOSAL
  Filled 2020-03-19: qty 15

## 2020-03-19 MED ORDER — FREE WATER
200.0000 mL | Freq: Four times a day (QID) | Status: DC
  Administered 2020-03-19 – 2020-04-22 (×134): 200 mL

## 2020-03-19 NOTE — Progress Notes (Signed)
Nutrition Follow-up  DOCUMENTATION CODES:   Not applicable  INTERVENTION:  Continue tube feeds via PEG: - Osmolite 1.2 @ 60 ml/hr (1440 ml/day) - ProSource TF 45 ml BID  Tube feeding regimen provides 1808 kcal, 102 grams of protein, and 1181 ml of H2O.   Add 200 ml free water every 6 hours  Total free water: 1981  NUTRITION DIAGNOSIS:   Inadequate oral intake related to acute illness as evidenced by estimated needs.  ongoing  GOAL:   Patient will meet greater than or equal to 90% of their needs  Met with TF  MONITOR:   Vent status,Labs,Weight trends,Skin,I & O's  REASON FOR ASSESSMENT:   Ventilator    ASSESSMENT:   64 year old female who presented to the ED on 11/03 with headache. 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 Pt required intubation for airway protection.  Pt discussed during ICU rounds and with RN.  Pt remains in the ICU after shunt placement 12/20. Per SLP pt not ready for swallow eval at this time.   11/03 - admitted 11/04 [facility], L PICA aneurysm to OR for craniotomy, clipping  11/09 - extubated but immediately re-intubated 11/12 - s/p thyroidectomy with tracheostomy; cortrak placed (tip gastric) 11/16 - EVD removed 11/17 - trach exchange to facilitate PMV 11/20 - Cortrak removed by pt 11/22 - trach change (pt self-decannulated) 11/26 - PEG placed 12/01 - MBS with recommendation for NPO 12/03 - s/p direct laryngoscopy 12/04 - transferred to ICU, put on mechanical ventilation 12/07 - tolerating TCT; transferred to 3W 12/16 - MRI shows increased hydrocephalus; tx ICU for EVD placement.  12/20 - s/p shunt placement   Admit weight: 66.9 kg Current weight: 64 kg    UOP: 560 ml x24 hours  Labs: K+ 6 CBGs: 142-161-187-160 Medications: colace, ss novolog Q4H, miralax  Diet Order:   Diet Order            Diet NPO time specified  Diet effective midnight                 EDUCATION  NEEDS:   No education needs have been identified at this time  Skin:  Skin Assessment: Skin Integrity Issues: Skin Integrity Issues:: Stage II,Other (Comment),Incisions Stage II: throat Incisions: neck, head Other: skin tear back  Last BM:  03/09/20  Height:   Ht Readings from Last 1 Encounters:  03/04/20 5' 9"  (1.753 m)    Weight:   Wt Readings from Last 1 Encounters:  03/11/20 64 kg    Ideal Body Weight:  65.9 kg  BMI:  Body mass index is 20.84 kg/m.  Estimated Nutritional Needs:   Kcal:  1700-1900  Protein:  95-110 grams  Fluid:  > 1.7 L/day  Lockie Pares., RD, LDN, CNSC See AMiON for contact information

## 2020-03-19 NOTE — Progress Notes (Signed)
NAME:  Stacy Moran, MRN:  [id number], DOB:  21-Dec-1955, LOS: 93 ADMISSION DATE:  01/30/2020, CONSULTATION DATE:  01/30/2020 REFERRING MD:  Ralene Ok, CHIEF COMPLAINT:  Headache   Brief History   64 yo female with large SAH from ruptured PICA complicated by hydrocephalus.  Required tracheostomy for airway protection.  No significant past medical history.  Significant Hospital Events   11/03 Admitted 11/04 craniotomy with clipping of left PICA 11/12 total thyroidectomy, tracheostomy 11/13 start ABx for fever and tracheobronchitis 11/16 Now 11 days post clipping.  Drain stopped yesterday so left clamped.  Was interactive with PT yesterday. This morning, agitated and complaining of back pain. More somnolent following fentanyl this morning. Stable from neuro check  11/21 pulm called back again;  trach bleeding. we called ENT 11/23 subglottic obstruction which is new observed on MBS-->CT neck ordered to r/o infection. Also abx started. As she failed MBS IR consulted for PEG. Should be last day for nimodipine. We asked IM to assist w. Care  12/04  Put on mechanical ventilation and transferred to ICU 12/06 tolerating PS 12/15 seizure prompting MRI showing worsening hydrocephalus 12/16 transferred to ICU for bedside ventriculostomy 12/21 laparoscopic assisted insertion of VP shunt  Consults:  ENT (Dr.Rosen) Palliative care Neurology >> s/o 12/17  Procedures:  11/03 ETT >>11/09 11/03 EVD >> 11/09 ETT >> 11/12 11/12 Tracheostomy >> 12/16 Ventriculostomy >>  Significant Diagnostic Tests:   11/3 CT head > large volume subarachnoid related to ruptured left PICA aneurysm, intraventricular reflux with hydrocephalus, chronic small vessel disease, chronic small vessel disease  11/9 H&N U/S > 2.3x2.3x2.4 mass in the left neck along the left thyroid lobe  11/27 CT head > Resolution of subarachnoid hemorrhage, stable subdural hematoma  11/29 CT head > Unchanged size of right subdural hematoma  with minimal leftward midline shift  12/15 MRI brain > Multiple small infarcts scattered throughout the right frontal and parietal lobes and involving the corpus callosum. Redemonstration of a right cerebral convexity fluid collection which measures up to 1.6 cm, which is likely increased from prior when comparing across modalities. Leftward midline shift is also slightly increased, now measuring approximately 6 mm. Interval development of obstructive hydrocephalus  12/19 MRV head > no venous thrombosis, diminutive Rt transverse sinus, Rt SDH 2.1 cm, thin subdural collections posterior fossa  Micro Data:  11/03 SARS2/ Flu > neg 11/08 respiratory culture >> negative 11/08 blood culture >>negative 11/11 urine culture >> multiple species 11/12 MRSA PCR >> negative 11/12 sputum: staph aureus, Citrobacter and Haemophilus parainfluenza (B lactamase +)  11/22 sputum >> multiple organism, no staph or strep 12/4 Sputum>> negative and gram-positive rods 12/4 wound trach site > neg 12/8 sputum > MSSA  Antimicrobials:  Zosyn 11/13 >> 11/14 Ceftriaxone 11/14 - 11/20 Ancef 12/13 > 12/19  Interim history/subjective:  Had VP shunt yesterday.  Objective   Blood pressure 123/61, pulse 73, temperature 97.8 F (36.6 C), temperature source Axillary, resp. rate 18, height 5\' 9"  (1.753 m), weight 64 kg, SpO2 97 %.    FiO2 (%):  [21 %] 21 %   Intake/Output Summary (Last 24 hours) at 03/19/2020 0803 Last data filed at 03/19/2020 0700 Gross per 24 hour  Intake 2552 ml  Output 1260 ml  Net 1292 ml   Filed Weights   03/06/20 0500 03/10/20 0500 03/11/20 0500  Weight: 59.7 kg 63.6 kg 64 kg    Examination:  General - alert Eyes - pupils reactive ENT - trach site clean Cardiac - regular rate/rhythm, no  murmur Chest - equal breath sounds b/l, no wheezing or rales Abdomen - soft, non tender, + bowel sounds Extremities - no cyanosis, clubbing, or edema Skin - no rashes Neuro - follows  commands Psych - normal mood and behavior  Resolved Hospital Problem list   Recurrent tracheobronchitis-recurrent from  Beta lactamase + H flu, Citrobacter and S.aureus are susceptible.: Patient completed 7 days treatment with ceftriaxone, Trach site bleeding   Assessment & Plan:   Compromised airway s/p tracheostomy. - trach care - speech to assess for PM valve and swallowing  SAH from ruptured PICA complicated by hydrocephalus s/p clipping. Atypical hygromas. - s/p VP shunt 12/21  Seizures - continue keppra  Agitation, insomnia. 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 - continue synthroid  Hyperglycemia w/o history of DM. - SSI  HTN - goal SBP < 160 - continue amlodipine, hydralazine, labetalol  Anemia of critical illness. - f/u CBC intermittently - transfuse for Hb < 7 or significant bleeding  Goals of care. - see by palliative care (last on 12/17) - continue aggressive therapy  Best Practice:  Nutrition: tube feeds DVT prophylaxis: SQ heparin SUP: protonix Mobility: PT/OT >> recommending SNF when able Code status: full code Disposition: ICU  PCCM will follow up intermittently for trach care.  Call if help needed in between.  Labs:   CMP Latest Ref Rng & Units 03/15/2020 03/14/2020 03/14/2020  Glucose 70 - 99 mg/dL 150(H) - 140(H)  BUN 8 - 23 mg/dL 23 - 21  Creatinine 0.44 - 1.00 mg/dL 0.52 - 0.46  Sodium 135 - 145 mmol/L 138 - 135  Potassium 3.5 - 5.1 mmol/L 4.1 4.4 6.0(H)  Chloride 98 - 111 mmol/L 100 - 99  CO2 22 - 32 mmol/L 27 - 20(L)  Calcium 8.9 - 10.3 mg/dL 9.6 - 9.1  Total Protein 6.5 - 8.1 g/dL - - -  Total Bilirubin 0.3 - 1.2 mg/dL - - -  Alkaline Phos 38 - 126 U/L - - -  AST 15 - 41 U/L - - -  ALT 0 - 44 U/L - - -    CBC Latest Ref Rng & Units 03/15/2020 03/14/2020 03/13/2020  WBC 4.0 - 10.5 K/uL 6.1 6.7 8.1  Hemoglobin 12.0 - 15.0 g/dL 10.1(L) 10.2(L) 10.7(L)  Hematocrit 36.0 - 46.0 % 33.3(L)  33.3(L) 33.7(L)  Platelets 150 - 400 K/uL 277 270 267    ABG    Component Value Date/Time   PHART 7.444 02/06/2020 0843   PCO2ART 42.8 02/06/2020 0843   PO2ART 157 (H) 02/06/2020 0843   HCO3 29.2 (H) 02/06/2020 0843   TCO2 30 02/06/2020 0843   ACIDBASEDEF 4.0 (H) 01/31/2020 0113   O2SAT 99.0 02/06/2020 0843    CBG (last 3)  Recent Labs    03/18/20 2302 03/19/20 0317 03/19/20 0747  GLUCAP 161* 187* 160*    Lab Results  Component Value Date   TSH 0.118 (L) 02/07/2020    Lab Results  Component Value Date   HGBA1C 6.2 (H) 01/30/2020    Signature:  Chesley Mires, MD [place] Pager - [phone] 03/19/2020, 8:03 AM

## 2020-03-19 NOTE — Progress Notes (Signed)
Daily Progress Note   Patient Name: Stacy Moran       Date: 03/19/2020 DOB: Jul 29, 1955  Age: 64 y.o. MRN#: [id number] Attending Physician: [name], MD Primary Care Physician: No primary care provider on file. Admit Date: 01/30/2020  Reason for Consultation/Follow-up: GOC discussion, psychosocial support  Subjective: Patient is s/p VP shunt placement for delayed onset hydrocephalus after SAH and clipping of left PICA.   Note that per nueurosurgery, post-op CT head shows shunt in proper position and and improved mass effect on right hemisphere with mildly decreased midline shift.   At the time of my visit, patient is working with PT. She has been sitting on the side of the bed for more than 20 minutes.   I asked Stacy Moran how she is doing -- she gives me a "thumbs up". She has become fatigued and asking to lay back down.    Length of Stay: 49  Current Medications: Scheduled Meds:  . amLODipine  10 mg Per Tube Daily  . chlorhexidine gluconate (MEDLINE KIT)  15 mL Mouth Rinse BID  . Chlorhexidine Gluconate Cloth  6 each Topical Daily  . docusate  100 mg Per Tube BID  . feeding supplement (PROSource TF)  45 mL Per Tube BID  . free water  200 mL Per Tube Q6H  . heparin  5,000 Units Subcutaneous Q8H  . hydrALAZINE  50 mg Per Tube Q8H  . insulin aspart  0-20 Units Subcutaneous Q4H  . labetalol  300 mg Per Tube TID  . levETIRAcetam  750 mg Per Tube BID  . levothyroxine  100 mcg Per Tube Q0600  . mouth rinse  15 mL Mouth Rinse q12n4p  . pantoprazole sodium  40 mg Per Tube Daily  . polyethylene glycol  17 g Per Tube Daily  . polyvinyl alcohol  1 drop Left Eye BID  . QUEtiapine  25 mg Per Tube QHS  . sodium chloride flush  10-40 mL Intracatheter Q12H    Continuous  Infusions: . sodium chloride    . feeding supplement (OSMOLITE 1.2 CAL) 1,000 mL (03/19/20 1248)    PRN Meds: sodium chloride, acetaminophen **OR** acetaminophen (TYLENOL) oral liquid 160 mg/5 mL **OR** acetaminophen, bisacodyl, guaiFENesin, hydrALAZINE, hydrocortisone cream, labetalol, ondansetron **OR** ondansetron (ZOFRAN) IV, sodium chloride flush  Physical Exam Vitals reviewed.  Cardiovascular:  Rate and Rhythm: Normal rate and regular rhythm.  Pulmonary:     Effort: Pulmonary effort is normal.     Comments: Trach collar at 28% Abdominal:     Comments: Tube feeding via PEG  Neurological:     Mental Status: She is alert.     Comments: Moving all extremities             Vital Signs: BP (!) 145/72   Pulse 73   Temp 98.9 F (37.2 C) (Axillary)   Resp 14   Ht 5' 9"  (1.753 m)   Wt 64 kg   SpO2 97%   BMI 20.84 kg/m  SpO2: SpO2: 97 % O2 Device: O2 Device: Tracheostomy Collar  Intake/output summary:   Intake/Output Summary (Last 24 hours) at 03/19/2020 1419 Last data filed at 03/19/2020 1230 Gross per 24 hour  Intake 2492 ml  Output 1460 ml  Net 1032 ml   LBM: Last BM Date: 03/18/20 Baseline Weight: Weight: 72.6 kg Most recent weight: Weight:  (unable to get due to bed scale being broken)       Palliative Assessment/Data: PPS 30% (with tube feeding)     Patient Active Problem List   Diagnosis Date Noted  . Obstructive hydrocephalus ([place])   . Seizure-like activity ([place]) 03/12/2020  . PEG (percutaneous endoscopic gastrostomy) status ([place]) 03/08/2020  . Hyperglycemia 03/08/2020  . Acute respiratory failure ([place])   . Ventilator dependence ([place])   . 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 Tracheostomy status ([place]) 02/19/2020  . Abdominal distention   . Ruptured aneurysm of artery ([place])   . SAH (subarachnoid hemorrhage) ([place])   . Subarachnoid bleed ([place])   . Tachypnea   . Prediabetes   .  Hypokalemia   . Leukocytosis   . Ileus, postoperative ([place])   . Pressure injury of skin 02/13/2020  . Ruptured cerebral aneurysm [facility]) 01/30/2020    Palliative Care Assessment & Plan   Patient Profile: 64 year-old female with no known medical history who was admitted on 01/30/2020 with a severe headache and found to have a large subarachnoid hemorrhage due to a ruptured aneurysm.  On 11/4, she underwent craniotomy with clipping of a left PICA aneurysm.   On 11/12, she underwent tracheostomy (for airway protection) and total thyroidectomy (due to large goiter). She was weaned from the external ventricular drain and initially appeared to be neurologically stable. She later was noted to be progressively more somnolent and repeat imaging showed ventriculomegaly consistent with delayed onset hydrocephalus. She was transferred back to ICU and ventricular drain was replaced which resulted in significant improvement in her mental status.  On 12/21, she underwent placement of VP shunt.  Assessment: - SAH from ruptured aneurysm, s/p clipping - hydrocephalus s/p VP shunt - seizures - compromised airway s/p tracheostomy - goiter s/p thyroidectomy - dysphagia s/p PEG placement  Recommendations/Plan: - full code, full scope - continue current medical care - PMT will continue to follow  Goals of Care and Additional Recommendations:  Limitations on Scope of Treatment: Full Scope Treatment  Code Status:  Full code  Prognosis:   Unable to determine, remains at high risk for acute decompensation  Discharge Planning:  To Be Determined   Thank you for allowing the Palliative Medicine Team to assist in the care of this patient.   Total Time 15 minutes Prolonged Time Billed  no       Greater than 50%  of this time  was spent counseling and coordinating care related to the above assessment and plan.  Stacy Bullion, NP  Please contact Palliative Medicine Team phone at [phone] for  questions and concerns.

## 2020-03-19 NOTE — Evaluation (Addendum)
Passy-Muir Speaking Valve - Evaluation Patient Details  Name: Stacy Moran MRN: [id number] Date of Birth: 06/27/1955  Today's Date: 03/19/2020 Time: [id number] SLP Time Calculation (min) (ACUTE ONLY): 21 min  Past Medical History: History reviewed. No pertinent past medical history. Past Surgical History:  Past Surgical History:  Procedure Laterality Date  . CRANIOTOMY Left 01/30/2020   Procedure: LEFT FAR LATERAL CRANIOTOMY FOR ANEURYSM CLIPPING;  Surgeon: [name], MD;  Location: [place];  Service: Neurosurgery;  Laterality: Left;  . DIRECT LARYNGOSCOPY N/A 02/29/2020   Procedure: DIRECT LARYNGOSCOPY;  Surgeon: [name], MD;  Location: [place];  Service: ENT;  Laterality: N/A;  . IR ANGIO INTRA EXTRACRAN SEL INTERNAL CAROTID BILAT MOD SED  01/30/2020  . IR ANGIO VERTEBRAL SEL VERTEBRAL UNI L MOD SED  01/30/2020  . IR GASTROSTOMY TUBE MOD SED  02/22/2020  . RADIOLOGY WITH ANESTHESIA N/A 01/30/2020   Procedure: IR WITH ANESTHESIA;  Surgeon: [name], MD;  Location: [place];  Service: Radiology;  Laterality: N/A;  . THYROIDECTOMY N/A 02/08/2020   Procedure: THYROIDECTOMY;  Surgeon: [name], MD;  Location: [place];  Service: ENT;  Laterality: N/A;  . TRACHEOSTOMY TUBE PLACEMENT N/A 02/08/2020   Procedure: TRACHEOSTOMY;  Surgeon: [name], MD;  Location: [place];  Service: ENT;  Laterality: N/A;  . 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 Admitted on 11/3, [place] on 11/4, vented until 11/12 when pt was trached. Also found to have multinodular goiter complicating respiratory failure so total thyroidectomy also completed on 11/12. Pt had been unable to functionaly utilize PMV due to copious secretions with inability to mobilize and severity of "pre  stenosis" limiteds airflow severely. 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 ST orders were discontinued and now re-ordered post op.   Assessment / Plan / Recommendation Clinical Impression  Since having shunt placed her alertness and ability to participate has improved compared to prior ST notes. Her respiratory support continues to be diminished and unable to clear audible secretions after cuff deflation- appears she was possibly moving them then swallowing without trach or oral expectoration. Secretions via trach are reduced from prior sessions. She wore the valve for total of 15 minutes and long intervals of 10 min with vitals remaining in normal range. Air did not aggregate with exhalations, valve remained on hub and when removed no signs of trapping. Pt indicated, with head nods, that she felt her breathing was adequate. There was barely audile vocalizations and attempts to phonate (wet).Valve doffed, cuff left deflated with RN aware and need for deep suctioning. ST will continue intervention.        She is not ready for assessment of swallowing yet given collection or oral secretions, no volitional cough or throat clear and known severity of dysphagia prior but will address when appropriate. SLP Visit Diagnosis:  (aphonia)    SLP Assessment       Follow Up Recommendations  Skilled Nursing facility;LTACH    Frequency and Duration         PMSV Trial PMSV was placed for: 15 min Able to redirect subglottic air through upper airway: Yes Able to  Attain Phonation: Yes Voice Quality: Hoarse;Low vocal intensity;Wet Able to Expectorate Secretions: No Level of Secretion Expectoration with PMSV: Not observed Breath Support for Phonation: Moderately decreased Intelligibility: Unable to assess  (comment) Respirations During Trial: 20 SpO2 During Trial:  (98-100%) Pulse During Trial: 81 Behavior: Alert;Controlled;Cooperative   Tracheostomy Tube       Vent Dependency  FiO2 (%): 21 %    Cuff Deflation Trial  GO Tolerated Cuff Deflation: Yes Length of Time for Cuff Deflation Trial: 21 min Behavior: Alert;Controlled;Cooperative;Responsive to questions        Houston Siren 03/19/2020, 10:41 AM  Orbie Pyo Colvin Caroli.Ed Risk analyst [phone] Office [phone]

## 2020-03-19 NOTE — Progress Notes (Deleted)
  Speech Language Pathology Treatment: Nada Boozer Speaking valve  Patient Details Name: Stacy Moran MRN: [id number] DOB: [birth year] Today's Date: 03/19/2020 Time: 1700-1749 SLP Time Calculation (min) (ACUTE ONLY): 21 min  Assessment / Plan / Recommendation Clinical Impression  Since having shunt placed her alertness and ability to participate has improved compared to prior ST notes. Her respiratory support continues to be diminished and unable to clear audible secretions after cuff deflation- appears she was possibly moving them then swallowing without trach or oral expectoration. She wore the valve for total of 15 minutes and long intervals of 10 min with vitals remaining in normal range. Air did not aggregate with exhalations, valve remained on hub and when removed no signs of trapping. Pt indicated, with head nods, that she felt her breathing was adequate. There was barely audile vocalizations and attempts to phonate (wet).Valve doffed, cuff left deflated with RN aware and need for deep suctioning. ST will continue intervention.   She is not ready for assessment of swallowing yet given collection or oral secretions, no volitional cough or throat clear and known severity of dysphagia prior but will address when appropriate.    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 Admitted on 11/3, [place] on 11/4, vented until 11/12 when pt was trached. Also found to have multinodular goiter complicating respiratory failure so total thyroidectomy also completed on 11/12. Pt had been unable to functionaly utilize PMV due to copious secretions with inability to mobilize and severity of "pre stenosis" limiteds airflow severely. She became increasingly lethargic and unable to participate in therapy 12/9-12/17- and found to have seizure activity, acute right infarcts and obstructive hydrocephalus and shunt placed 12/20 (unable to obtain OR slot  prior). ST for PMV, cognition and swallow all reordered post procedure.      SLP Plan  Continue with current plan of care       Recommendations         Patient may use Passy-Muir Speech Valve: with SLP only PMSV Supervision: Full         Follow up Recommendations: Skilled Nursing facility;LTACH SLP Visit Diagnosis: Other (comment) (aphonia) Plan: Continue with current plan of care       GO                Houston Siren 03/19/2020, 10:22 AM  Orbie Pyo Colvin Caroli.Ed Risk analyst [phone] Office [phone]

## 2020-03-19 NOTE — Progress Notes (Signed)
  NEUROSURGERY PROGRESS NOTE   No issues overnight. Pt without complaint  EXAM:  BP 104/70   Pulse 78   Temp 98.9 F (37.2 C) (Axillary)   Resp 18   Ht 5\' 9"  (1.753 m)   Wt 64 kg   SpO2 97%   BMI 20.84 kg/m   Awake, alert Nods to questions MAE well Wounds c/d/i  IMAGING: CTH reviewed, shunt in proper position, SDH now appears isodense to CSF, suspect this is saline. Improved mass effect on right hemisphere with decreased MLS.  IMPRESSION:  64 y.o. 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 Appears to be at neurologic baseline  PLAN: - Will monitor in ICU for today - Can likely transfer to stepdown tomorrow - Cont dispo planning, likely SNF.   [name], MD [facility]

## 2020-03-19 NOTE — Progress Notes (Signed)
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  11/4, pt s/p L far-lateral craniotomy for clipping of PICA aneurysm. 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 12/4 pt with desat and bradycardia and was transferred to ICU and placed on the vent. Pt off vent 12/6. 12/15 seizure prompting MRI showing worsening hydrocephalus. 12/16 transferred to ICU for bedside ventriculostomy. Pt is now s/p VP shunt placement on 12/21.   OT comments  Pt seen s/p VP shunt placement. Pt tolerating EOB activity (>10 min) and sit<>stand/standing trials during session. Overall pt requiring two person assist for mobility tasks, but was able to progress to minA+1-2 for static balance in standing for brief periods of time. Emphasis on reaching/maintaining midline while seated and wt shifting in standing. Pt totalA for pericare after episode of BM. VSS throughout. Feel POC remains appropriate at this time. Will continue to follow acutely.   Follow Up Recommendations  SNF;Supervision/Assistance - 24 hour    Equipment Recommendations  Wheelchair (measurements OT);Wheelchair cushion (measurements OT);Hospital bed (TBD)          Precautions / Restrictions Precautions Precautions: Fall Precaution Comments: trach Restrictions Weight Bearing Restrictions: No       Mobility Bed Mobility Overal bed mobility: Needs Assistance Bed Mobility: Supine to Sit;Sit to Supine     Supine to sit: Total assist;+2 for physical assistance;+2 for safety/equipment Sit to supine: Max assist;+2 for physical assistance;+2 for safety/equipment   General bed mobility  comments: decreased initiation to progress to EOB, pt more motivated to return to supine therefore attempting to assist with LEs  Transfers Overall transfer level: Needs assistance Equipment used: 2 person hand held assist Transfers: Sit to/from Stand Sit to Stand: Mod assist;+2 physical assistance;+2 safety/equipment         General transfer comment: boosting and standing assist, pt initiating transitions given cues to do so, use of small straight back chair for UE support during sit<>stand trials; x2 sit<>stand from EOB. while standing working on wt shifting L/R and static balance for pericare - pt able to progress to minA+2 and even brief periods of minA+1 for static balance    Balance Overall balance assessment: Needs assistance Sitting-balance support: Feet supported;Bilateral upper extremity supported Sitting balance-Leahy Scale: Poor Sitting balance - Comments: typically reliant on external assist, fluctating levels Postural control: Right lateral lean Standing balance support: Bilateral upper extremity supported;During functional activity Standing balance-Leahy Scale: Poor Standing balance comment: external assist for standing balance and UE support                           ADL either performed or assessed with clinical judgement   ADL Overall ADL's : Needs assistance/impaired                 Upper Body Dressing : Maximal assistance;Sitting Upper Body Dressing Details (indicate cue type and reason): donning new gown         Toileting- Clothing Manipulation and Hygiene: Total assistance;+2 for physical assistance;+2 for safety/equipment;Sit to/from stand Toileting - Clothing Manipulation Details (indicate cue type and reason): assist for standing balance with additional assist for posterior pericare after incontinent BM  Functional mobility during ADLs: Moderate assistance;+2 for physical assistance;+2 for safety/equipment (sit<>stand)        Vision   Additional Comments: pt with L eye notably dysconjugate/fixed   Perception     Praxis      Cognition Arousal/Alertness: Awake/alert;Lethargic Behavior During Therapy: Flat affect Overall Cognitive Status: Impaired/Different from baseline Area of Impairment: Attention;Memory;Following commands;Safety/judgement;Awareness;Problem solving                 Orientation Level: Situation;Time Current Attention Level: Focused Memory: Decreased recall of precautions;Decreased short-term memory Following Commands: Follows one step commands inconsistently;Follows one step commands with increased time Safety/Judgement: Decreased awareness of safety;Decreased awareness of deficits Awareness: Emergent Problem Solving: Slow processing;Decreased initiation;Difficulty sequencing;Requires verbal cues;Requires tactile cues General Comments: inconsistent command follow which improved as session progressed (pt also more lethargic start of session); pt restless and easily distractible by lines, wanting to have covers back on her        Exercises Exercises: Other exercises Other Exercises Other Exercises: warm up ROM exercises to UE/LE   Shoulder Instructions       General Comments      Pertinent Vitals/ Pain       Pain Assessment: Faces Faces Pain Scale: Hurts a little bit Pain Location: generalized Pain Descriptors / Indicators: Discomfort;Grimacing Pain Intervention(s): Monitored during session  Home Living                                          Prior Functioning/Environment              Frequency  Min 2X/week        Progress Toward Goals  OT Goals(current goals can now be found in the care plan section)  Progress towards OT goals: Progressing toward goals  Acute Rehab OT Goals Patient Stated Goal: none stated OT Goal Formulation: Patient unable to participate in goal setting Time For Goal Achievement: 03/28/20 Potential to Achieve  Goals: Good ADL Goals Pt Will Perform Grooming: with min assist Pt Will Perform Upper Body Bathing: with min assist Pt/caregiver will Perform Home Exercise Program: Increased strength;Increased ROM;Both right and left upper extremity;With minimal assist;With written HEP provided Additional ADL Goal #1: Pt will follow multi-step commands with 75% accuracy and no more than min cues. Additional ADL Goal #2: Pt will perform bed mobility with modA as precursor to EOB/OOB ADL. Additional ADL Goal #3: Pt will maintain static balance at EOB with min A for >5 minutes as precursor to ADLs in unsupported setting  Plan Discharge plan remains appropriate    Co-evaluation    PT/OT/SLP Co-Evaluation/Treatment: Yes Reason for Co-Treatment: Complexity of the patient's impairments (multi-system involvement);For patient/therapist safety;To address functional/ADL transfers PT goals addressed during session: Mobility/safety with mobility OT goals addressed during session: ADL's and self-care      AM-PAC OT "6 Clicks" Daily Activity     Outcome Measure   Help from another person eating meals?: Total Help from another person taking care of personal grooming?: A Lot Help from another person toileting, which includes using toliet, bedpan, or urinal?: Total Help from another person bathing (including washing, rinsing, drying)?: Total Help from another person to put on and taking off regular upper body clothing?: Total Help from another person to put on and taking off regular lower body clothing?: Total 6 Click Score: 7    End of Session Equipment Utilized During Treatment: Oxygen  OT Visit Diagnosis: Unsteadiness on feet (R26.81);Muscle weakness (generalized) (M62.81);Other abnormalities of gait and mobility (R26.89)   Activity Tolerance Patient tolerated treatment well   Patient Left in bed;with call bell/phone within reach   Nurse Communication Mobility status;Other (comment) (pt needs suctioning/bed  alarm not working)        Time: [id number] OT Time Calculation (min): 29 min  Charges: OT General Charges $OT Visit: 1 Visit OT Treatments $Self Care/Home Management : 8-22 mins  Lou Cal, OT Acute Rehabilitation Services Pager [phone] Office [phone]   Raymondo Band 03/19/2020, 5:44 PM

## 2020-03-19 NOTE — Progress Notes (Signed)
1 Day Post-Op  Subjective: POD1 s/p lap assisted VP shunt placement. Tube feeds running via G tube. Afebrile.  ROS: See above, otherwise other systems negative  Objective: Vital signs in last 24 hours: Temp:  [97.1 F (36.2 C)-97.8 F (36.6 C)] 97.8 F (36.6 C) (12/22 0400) Pulse Rate:  [62-88] 73 (12/22 0630) Resp:  [8-26] 19 (12/22 0630) BP: (113-176)/(61-140) 148/67 (12/22 0600) SpO2:  [93 %-100 %] 99 % (12/22 0630) FiO2 (%):  [21 %] 21 % (12/22 0400) Last BM Date: 03/18/20  Intake/Output from previous day: 12/21 0701 - 12/22 0700 In: 2492 [I.V.:1500; NG/GT:682; IV Piggyback:250] Out: 610 [Urine:560; Blood:50] Intake/Output this shift: No intake/output data recorded.  PE: General: resting comfortably, NAD Neuro: nonverbal, moving extremities HEENT: trach in place, dressing in place right scalp Abdomen: soft, nondistended, incisions clean and dry  Lab Results:  No results for input(s): WBC, HGB, HCT, PLT in the last 72 hours. BMET No results for input(s): NA, K, CL, CO2, GLUCOSE, BUN, CREATININE, CALCIUM in the last 72 hours. PT/INR No results for input(s): LABPROT, INR in the last 72 hours. CMP     Component Value Date/Time   NA 138 03/15/2020 0416   K 4.1 03/15/2020 0416   CL 100 03/15/2020 0416   CO2 27 03/15/2020 0416   GLUCOSE 150 (H) 03/15/2020 0416   BUN 23 03/15/2020 0416   CREATININE 0.52 03/15/2020 0416   CALCIUM 9.6 03/15/2020 0416   PROT 7.4 03/13/2020 1141   ALBUMIN 3.2 (L) 03/13/2020 1141   AST 19 03/13/2020 1141   ALT 16 03/13/2020 1141   ALKPHOS 87 03/13/2020 1141   BILITOT 0.5 03/13/2020 1141   GFRNONAA >60 03/15/2020 0416   Lipase  No results found for: LIPASE     Studies/Results: No results found.  Anti-infectives: Anti-infectives (From admission, onward)   Start     Dose/Rate Route Frequency Ordered Stop   03/18/20 1442  ceFAZolin (ANCEF) 2-4 GM/100ML-% IVPB       Note to Pharmacy: Tamsen Snider   : cabinet override       03/18/20 1442 03/19/20 0244   03/13/20 1400  ceFAZolin (ANCEF) IVPB 2g/100 mL premix        2 g 200 mL/hr over 30 Minutes Intravenous Every 8 hours 03/13/20 1257 03/16/20 2139   03/10/20 1515  ceFAZolin (ANCEF) IVPB 1 g/50 mL premix  Status:  Discontinued        1 g 100 mL/hr over 30 Minutes Intravenous Every 8 hours 03/10/20 1422 03/13/20 1257   02/22/20 1845  ceFAZolin (ANCEF) IVPB 2g/100 mL premix  Status:  Discontinued        2 g 200 mL/hr over 30 Minutes Intravenous  Once 02/22/20 1758 03/01/20 0817   02/22/20 1522  ceFAZolin (ANCEF) IVPB 2g/100 mL premix        over 30 Minutes  Continuous PRN 02/22/20 1523 02/22/20 1510   02/22/20 1514  ceFAZolin (ANCEF) 2-4 GM/100ML-% IVPB       Note to Pharmacy: Luis Abed   : cabinet override      02/22/20 1514 02/23/20 0329   02/19/20 1500  ceFEPIme (MAXIPIME) 2 g in sodium chloride 0.9 % 100 mL IVPB        2 g 200 mL/hr over 30 Minutes Intravenous Every 12 hours 02/19/20 1408 02/26/20 0836   02/10/20 1445  cefTRIAXone (ROCEPHIN) 2 g in sodium chloride 0.9 % 100 mL IVPB        2 g 200 mL/hr  over 30 Minutes Intravenous Every 24 hours 02/10/20 1348 02/15/20 1624   02/09/20 1500  piperacillin-tazobactam (ZOSYN) IVPB 3.375 g  Status:  Discontinued        3.375 g 12.5 mL/hr over 240 Minutes Intravenous Every 8 hours 02/09/20 0755 02/10/20 1348   02/09/20 0845  piperacillin-tazobactam (ZOSYN) IVPB 3.375 g        3.375 g 100 mL/hr over 30 Minutes Intravenous  Once 02/09/20 0755 02/09/20 1027       Assessment/Plan 64 yo female POD1 s/p VP shunt placement with laparoscopic assistance. Abdomen soft, tolerating G tube feeds. Routine wound care for abdominal incisions (covered with dermabond). General surgery will sign off at this time, please call with any further questions or concerns.   LOS: 49 days    Sophronia Simas, MD [facility] Surgery General, Hepatobiliary and Pancreatic Surgery 03/19/20 7:09 AM

## 2020-03-19 NOTE — Progress Notes (Signed)
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  11/4, pt s/p L far-lateral craniotomy for clipping of PICA aneurysm. 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 12/4 pt with desat and bradycardia and was transferred to ICU and placed on the vent. Pt off vent 12/6. 12/15 seizure prompting MRI showing worsening hydrocephalus. 12/16 transferred to ICU for bedside ventriculostomy. Pt is now s/p VP shunt placement on 12/21.    PT Comments    Pt still slow to progress toward goals, needing encouragement to extend participation.  Emphasis on transition to sitting, sitting balance/midline orientation with work on cervical rotation L and L Lateral flexion into midline, sit to stand at EOB with some work on standing/pre gait activity.    Follow Up Recommendations  SNF;Supervision/Assistance - 24 hour     Equipment Recommendations  Wheelchair (measurements PT);Wheelchair cushion (measurements PT);3in1 (PT)    Recommendations for Other Services       Precautions / Restrictions Precautions Precautions: Fall Precaution Comments: trach Restrictions Weight Bearing Restrictions: No    Mobility  Bed Mobility Overal bed mobility: Needs Assistance Bed Mobility: Supine to Sit;Sit to Supine     Supine to sit: Total assist;+2 for physical assistance;+2 for safety/equipment Sit to supine: Max assist;+2 for physical assistance;+2 for safety/equipment   General bed mobility comments: decreased initiation to progress to EOB, pt more motivated to return to supine therefore attempting to assist with LEs  Transfers Overall transfer level: Needs  assistance Equipment used: 2 person hand held assist Transfers: Sit to/from Stand Sit to Stand: Mod assist;+2 physical assistance;+2 safety/equipment         General transfer comment: boosting and standing assist, pt initiating transitions given cues to do so, use of small straight back chair for UE support during sit<>stand trials; x2 sit<>stand from EOB. while standing working on wt shifting L/R and static balance for pericare - pt able to progress to minA+2 and even brief periods of minA+1 for static balance  Ambulation/Gait                 Stairs             Wheelchair Mobility    Modified Rankin (Stroke Patients Only)       Balance Overall balance assessment: Needs assistance Sitting-balance support: Feet supported;Bilateral upper extremity supported Sitting balance-Leahy Scale: Poor Sitting balance - Comments: typically reliant on external assist, fluctating levels Postural control: Right lateral lean Standing balance support: Bilateral upper extremity supported;During functional activity Standing balance-Leahy Scale: Poor Standing balance comment: external assist for standing balance and UE support                            Cognition Arousal/Alertness: Awake/alert;Lethargic Behavior During Therapy: Flat affect Overall Cognitive Status: Impaired/Different from baseline Area of Impairment: Attention;Memory;Following commands;Safety/judgement;Awareness;Problem solving                   Current Attention Level: Focused Memory: Decreased recall of precautions;Decreased short-term memory Following Commands: Follows one step commands inconsistently;Follows one step commands with increased time Safety/Judgement: Decreased awareness of safety;Decreased awareness of deficits Awareness: Emergent Problem Solving: Slow processing;Decreased initiation;Difficulty sequencing;Requires  verbal cues;Requires tactile cues General Comments: inconsistent  command follow which improved as session progressed (pt also more lethargic start of session); pt restless and easily distractible by lines, wanting to have covers back on her      Exercises Other Exercises Other Exercises: warm up ROM exercises to UE/LE    General Comments        Pertinent Vitals/Pain Pain Assessment: Faces Faces Pain Scale: Hurts a little bit Pain Location: generalized Pain Descriptors / Indicators: Discomfort;Grimacing Pain Intervention(s): Monitored during session    Home Living                      Prior Function            PT Goals (current goals can now be found in the care plan section) Acute Rehab PT Goals Patient Stated Goal: none stated PT Goal Formulation: With patient Time For Goal Achievement: 03/28/20 Potential to Achieve Goals: Fair Progress towards PT goals: Progressing toward goals    Frequency    Min 2X/week      PT Plan Current plan remains appropriate    Co-evaluation PT/OT/SLP Co-Evaluation/Treatment: Yes Reason for Co-Treatment: Complexity of the patient's impairments (multi-system involvement);For patient/therapist safety;To address functional/ADL transfers PT goals addressed during session: Mobility/safety with mobility OT goals addressed during session: ADL's and self-care      AM-PAC PT "6 Clicks" Mobility   Outcome Measure  Help needed turning from your back to your side while in a flat bed without using bedrails?: A Lot Help needed moving from lying on your back to sitting on the side of a flat bed without using bedrails?: A Lot Help needed moving to and from a bed to a chair (including a wheelchair)?: A Lot Help needed standing up from a chair using your arms (e.g., wheelchair or bedside chair)?: A Lot Help needed to walk in hospital room?: Total Help needed climbing 3-5 steps with a railing? : Total 6 Click Score: 10    End of Session   Activity Tolerance: Patient tolerated treatment well Patient  left: in bed;with call bell/phone within reach Nurse Communication: Mobility status PT Visit Diagnosis: Hemiplegia and hemiparesis;Other abnormalities of gait and mobility (R26.89);Other symptoms and signs involving the nervous system (R29.898) Hemiplegia - Right/Left: Left Hemiplegia - dominant/non-dominant: Non-dominant Hemiplegia - caused by: Other Nontraumatic intracranial hemorrhage     Time: [id number] PT Time Calculation (min) (ACUTE ONLY): 30 min  Charges:  $Neuromuscular Re-education: 8-22 mins                     03/19/2020  [name], PT Acute Rehabilitation Services [phone]  (pager) [phone]  (office)   Tessie Fass Elle Vezina 03/19/2020, 5:58 PM

## 2020-03-20 ENCOUNTER — Encounter (HOSPITAL_COMMUNITY): Payer: Self-pay | Admitting: Neurosurgery

## 2020-03-20 LAB — GLUCOSE, CAPILLARY
Glucose-Capillary: 135 mg/dL — ABNORMAL HIGH (ref 70–99)
Glucose-Capillary: 141 mg/dL — ABNORMAL HIGH (ref 70–99)
Glucose-Capillary: 98 mg/dL (ref 70–99)
Glucose-Capillary: 159 mg/dL — ABNORMAL HIGH (ref 70–99)
Glucose-Capillary: 91 mg/dL (ref 70–99)

## 2020-03-20 NOTE — Evaluation (Signed)
Speech Language Pathology Evaluation Patient Details Name: Stacy Moran MRN: [id number] DOB: 1956-02-09 Today's Date: 03/20/2020 Time: 9622-2979 SLP Time Calculation (min) (ACUTE ONLY): 14 min  Problem List:  Patient Active Problem List   Diagnosis Date Noted  . Obstructive hydrocephalus ([place])   . Seizure-like activity ([place]) 03/12/2020  . PEG (percutaneous endoscopic gastrostomy) status ([place]) 03/08/2020  . Hyperglycemia 03/08/2020  . Acute respiratory failure ([place])   . Ventilator dependence ([place])   . 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 Tracheostomy status ([place]) 02/19/2020  . Abdominal distention   . 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 SAH (subarachnoid hemorrhage) ([place])   . Subarachnoid bleed ([place])   . Tachypnea   . Prediabetes   . Hypokalemia   . Leukocytosis   . Ileus, postoperative ([place])   . Pressure injury of skin 02/13/2020  . Ruptured cerebral aneurysm ([place]) 01/30/2020   Past Medical History: History reviewed. No pertinent past medical history. Past Surgical History:  Past Surgical History:  Procedure Laterality Date  . CRANIOTOMY Left 01/30/2020   Procedure: LEFT FAR LATERAL CRANIOTOMY FOR ANEURYSM CLIPPING;  Surgeon: [name], MD;  Location: [facility];  Service: Neurosurgery;  Laterality: Left;  . DIRECT LARYNGOSCOPY N/A 02/29/2020   Procedure: DIRECT LARYNGOSCOPY;  Surgeon: [name], MD;  Location: [facility];  Service: ENT;  Laterality: N/A;  . IR ANGIO INTRA EXTRACRAN SEL INTERNAL CAROTID BILAT MOD SED  01/30/2020  . IR ANGIO VERTEBRAL SEL VERTEBRAL UNI L MOD SED  01/30/2020  . IR GASTROSTOMY TUBE MOD SED  02/22/2020  . RADIOLOGY WITH ANESTHESIA N/A 01/30/2020   Procedure: IR WITH ANESTHESIA;  Surgeon: [name], MD;  Location: [facility];  Service: Radiology;  Laterality: N/A;  . THYROIDECTOMY N/A 02/08/2020   Procedure: THYROIDECTOMY;  Surgeon: [name], MD;  Location: [facility];  Service:  ENT;  Laterality: N/A;  . TRACHEOSTOMY TUBE PLACEMENT N/A 02/08/2020   Procedure: TRACHEOSTOMY;  Surgeon: [name], MD;  Location: [facility];  Service: ENT;  Laterality: N/A;  . TRACHEOSTOMY TUBE PLACEMENT N/A 02/29/2020   Procedure: TRACHEOSTOMY EXCHANGE;  Surgeon: [name], MD;  Location: [facility];  Service: ENT;  Laterality: N/A;   HPI:  64 y/o presented with large subarachnoid hemorrhage due to ruptured PICA with resultant hydrocephalus. Admitted on 11/3, [place] on 11/4, vented until 11/12 when pt was trached. Also found to have multinodular goiter complicating respiratory failure so total thyroidectomy also completed on 11/12. Pt had been unable to functionaly utilize PMV due to copious secretions with inability to mobilize and severity of "pre stenosis" limiteds airflow severely. She became increasingly lethargic and unable to participate in therapy 12/9-12/17- and found to have seizure activity, acute right infarcts and obstructive hydrocephalus and shunt placed 12/20 (unable to obtain OR slot prior). ST orders were discontinued and now re-ordered post op.   Assessment / Plan / Recommendation Clinical Impression  Pt has had a prolonged and complex hospital stay with actue infarcts in setting of SDH on admission. PMV donned for assessment however, expression is whispered, wet/congested and barely audible. She was not oriented to situation or time and sustaining attention to therapist was challenging. She was restless and distractible. She had difficulty with responses to basic problem solving situations. She wrote her name accurately and grandchildren's names somewhat legible and therapist uncertain of accuracy. Therapist wil continue to evaluate during diagnostic therapy in addition to verbal expression and speech intelligibility.    SLP  Assessment  SLP Recommendation/Assessment: Patient needs continued Speech Lanaguage Pathology Services SLP Visit Diagnosis: Cognitive communication deficit  (R41.841)    Follow Up Recommendations  Skilled Nursing facility;LTACH    Frequency and Duration min 2x/week  2 weeks      SLP Evaluation Cognition  Overall Cognitive Status: Impaired/Different from baseline Arousal/Alertness: Awake/alert Orientation Level: Oriented to person;Oriented to place;Disoriented to time;Disoriented to situation Attention: Sustained Focused Attention: Appears intact Sustained Attention: Impaired Sustained Attention Impairment: Functional basic Memory: Impaired Awareness: Impaired Awareness Impairment: Intellectual impairment;Emergent impairment;Anticipatory impairment Problem Solving: Impaired Problem Solving Impairment: Functional basic Behaviors: Restless Safety/Judgment: Impaired       Comprehension  Auditory Comprehension Overall Auditory Comprehension: Impaired Yes/No Questions: Not tested Commands: Impaired One Step Basic Commands: 50-74% accurate Interfering Components: Attention Visual Recognition/Discrimination Discrimination: Not tested Reading Comprehension Reading Status: Not tested    Expression Expression Primary Mode of Expression: Other (comment) (whispers) Verbal Expression Overall Verbal Expression: Other (comment) (functional for whispers) Pragmatics: Impairment Impairments: Eye contact Interfering Components: Attention Written Expression Dominant Hand: Right Written Expression: Exceptions to [facility] Self Formulation Ability: Word   Oral / Motor  Motor Speech Overall Motor Speech:  (will continue to assess)   GO                    Houston Siren 03/20/2020, 2:44 PM Orbie Pyo Kagen Kunath M.Ed Risk analyst [phone] Office [phone]

## 2020-03-20 NOTE — Progress Notes (Signed)
  Speech Language Pathology Treatment: Stacy Moran Speaking valve  Patient Details Name: Stacy Moran MRN: [id number] DOB: 11-08-1955 Today's Date: 03/20/2020 Time: 4098-1191 SLP Time Calculation (min) (ACUTE ONLY): 14 min  Assessment / Plan / Recommendation Clinical Impression  Stacy Moran was more awake this session, mildly restless during PMV treatment. Valve was worn throughout session and swallow assessment afterward. Valve remained in place with adequate exhalation and vital signs stable. She has significantly wet vocal quality and congestion. Therapist donned/doffed valve in coordination with respiration to provide adequate pressure for cough and attempts to clear mucous. She eventually expectorated mild amount via trach. She was unable to obtain vocalizations but whispered attempts were clearer today with mucous expectoration. Resistance/bearing down did not facilitate phonation.  Pt is getting closer to evaluation of swallowing. Goal would be for improved pharyngeal/laryngeal clearance.                                                                                                                           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 Admitted on 11/3, [place] on 11/4, vented until 11/12 when pt was trached. Also found to have multinodular goiter complicating respiratory failure so total thyroidectomy also completed on 11/12. Pt had been unable to functionaly utilize PMV due to copious secretions with inability to mobilize and severity of "pre stenosis" limiteds airflow severely. She became increasingly lethargic and unable to participate in therapy 12/9-12/17- and found to have seizure activity, acute right infarcts and obstructive hydrocephalus and shunt placed 12/20 (unable to obtain OR slot prior). ST orders were discontinued and now re-ordered post op.      SLP Plan  Continue with current plan of care        Recommendations         Patient may use Passy-Muir Speech Valve: with SLP only PMSV Supervision: Full MD: Please consider changing trach tube to : Cuffless         Oral Care Recommendations: Oral care QID Follow up Recommendations: Skilled Nursing facility;LTACH SLP Visit Diagnosis:  (aphonia) Plan: Continue with current plan of care       [place], Stacy Moran 03/20/2020, 1:17 PM  Stacy Moran.Ed Risk analyst [phone] Office [phone]

## 2020-03-21 LAB — GLUCOSE, CAPILLARY
Glucose-Capillary: 100 mg/dL — ABNORMAL HIGH (ref 70–99)
Glucose-Capillary: 111 mg/dL — ABNORMAL HIGH (ref 70–99)
Glucose-Capillary: 119 mg/dL — ABNORMAL HIGH (ref 70–99)
Glucose-Capillary: 133 mg/dL — ABNORMAL HIGH (ref 70–99)
Glucose-Capillary: 135 mg/dL — ABNORMAL HIGH (ref 70–99)
Glucose-Capillary: 95 mg/dL (ref 70–99)

## 2020-03-21 NOTE — Progress Notes (Signed)
Subjective: The patient is alert and pleasant.  She will answer simple questions.  He is in no apparent distress.  Objective: Vital signs in last 24 hours: Temp:  [98.6 F (37 C)-99.5 F (37.5 C)] 98.8 F (37.1 C) (12/24 0400) Pulse Rate:  [73-99] 82 (12/24 0700) Resp:  [16-24] 22 (12/24 0700) BP: (116-175)/(68-113) 151/91 (12/24 0700) SpO2:  [94 %-99 %] 97 % (12/24 0700) FiO2 (%):  [21 %] 21 % (12/24 0400) Estimated body mass index is 20.84 kg/m as calculated from the following:   Height as of this encounter: 5\' 9"  (1.753 m).   Weight as of this encounter: 64 kg.   Intake/Output from previous day: 12/23 0701 - 12/24 0700 In: 2338 [NG/GT:2068] Out: 1100 [Urine:1100] Intake/Output this shift: No intake/output data recorded.  Physical exam the patient is alert and pleasant.  She is moving all 4 extremities.  She has divergent gaze.  Her wounds are healing well.  Lab Results: No results for input(s): WBC, HGB, HCT, PLT in the last 72 hours. BMET No results for input(s): NA, K, CL, CO2, GLUCOSE, BUN, CREATININE, CALCIUM in the last 72 hours.  Studies/Results: CT HEAD WO CONTRAST  Result Date: 03/19/2020 CLINICAL DATA:  Subdural hemorrhage. EXAM: CT HEAD WITHOUT CONTRAST TECHNIQUE: Contiguous axial images were obtained from the base of the skull through the vertex without intravenous contrast. COMPARISON:  03/15/2020 FINDINGS: Brain: A right frontal approach ventriculostomy catheter terminates in the right frontal horn. The lateral and third ventricles are less effaced/collapsed than on the prior CT. A subdural hematoma over the right cerebral convexity has mildly decreased in size, now measuring 1.8 cm in maximal thickness (previously 2.0 cm). There is increased small volume gas within the subdural collection, and there is also a small amount of gas in the frontal horn of the right lateral ventricle. Leftward midline shift has mildly decreased and now measures 4 mm (previously 6  mm). Small volume low-density extra-axial fluid in the posterior fossa, most notable lateral to the left cerebellar hemisphere, is unchanged. The basilar cisterns remain partially effaced. No acute large territory infarct is identified. The small infarcts primarily involving white matter of the right frontal and parietal lobe and corpus callosum on the recent prior MRI are not well demonstrated by CT. There is a background of moderate chronic small vessel ischemia in the cerebral white matter with a chronic lacunar infarct in the right basal ganglia. Vascular: Calcified atherosclerosis at the skull base. Left PICA aneurysm clipping. 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 Right parietal burr holes. Sinuses/Orbits: Paranasal sinuses and mastoid air cells are clear. Left ocular prosthesis. Other: Right-sided scalp skin staples and mild soft tissue swelling. Gas within the right-sided scalp and included upper neck soft tissues along the course of the new VP shunt catheter. IMPRESSION: 1. Mildly decreased size of right cerebral convexity subdural hematoma with mildly decreased midline shift. 2. Decreased effacement/collapse of the lateral and third ventricles. Interval VP shunt placement with increased pneumocephalus. Electronically Signed   By: Logan Bores M.D.   On: 03/19/2020 11:21    Assessment/Plan: Postop day #3: We are awaiting skilled nursing facility placement.  LOS: 51 days     Stacy Moran 03/21/2020, 8:31 AM

## 2020-03-22 LAB — GLUCOSE, CAPILLARY
Glucose-Capillary: 116 mg/dL — ABNORMAL HIGH (ref 70–99)
Glucose-Capillary: 121 mg/dL — ABNORMAL HIGH (ref 70–99)
Glucose-Capillary: 128 mg/dL — ABNORMAL HIGH (ref 70–99)
Glucose-Capillary: 138 mg/dL — ABNORMAL HIGH (ref 70–99)
Glucose-Capillary: 141 mg/dL — ABNORMAL HIGH (ref 70–99)
Glucose-Capillary: 145 mg/dL — ABNORMAL HIGH (ref 70–99)
Glucose-Capillary: 70 mg/dL (ref 70–99)

## 2020-03-22 NOTE — TOC Initial Note (Signed)
Transition of Care [facility]) - Initial/Assessment Note    Patient Details  Name: Stacy Moran MRN: [id number] Date of Birth: [birth year]  [facility] CM/SW Contact:    Verdell Carmine, RN Phone Number: 03/22/2020, 12:52 PM  Clinical Narrative:                 Patient on day 80 neurologic compromise, No known history admitted with sudden onset of headache Hypertensive crisis, PICA ruptured aneurysm. Had clipping on 11/12. With tracheostomy VP shunt had to be placed. 12/21. Patient is  uninsured at present.   Will be checking in with [facility] to see any progress made on Medicaid etc. (email sent to [place]) Patient unable to care for self currently, PT recommendation of SNF however has numerous barrier sue to uninsured, VP shunt, Patient lives with her daughter and grandchildren and a boxer. 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 There is not a good discharge plan in place. [name] is the next of kin and the one to make decisions for her should she not be able to make decisions. Unsure if she could take her home with little to no resources.  Plan: [facility] consult Work with Difficult to place team and Supervisor CM on patient issue for resolvent. Continuation of aggressive therapy.   Expected Discharge Plan: [place] Barriers to Discharge: Inadequate or no insurance   Patient Goals and CMS Choice        Expected Discharge Plan and Services Expected Discharge Plan: [place]       Living arrangements for the past 2 months:  (In hospital the last 52 days)                                      Prior Living Arrangements/Services Living arrangements for the past 2 months:  (In hospital the last 52 days)   Patient language and need for interpreter reviewed:: Yes        Need for Family Participation in Patient Care: Yes (Comment) Care giver support system in place?: Yes (comment)   Criminal Activity/Legal  Involvement Pertinent to Current Situation/Hospitalization: No - Comment as needed  Activities of Daily Living      Permission Sought/Granted                  Emotional Assessment       Orientation: : Fluctuating Orientation (Suspected and/or reported Sundowners) Alcohol / Substance Use: Not Applicable Psych Involvement: No (comment)  Admission diagnosis:  Ruptured aneurysm of artery (HCC) [I72.9] SAH (subarachnoid hemorrhage) ([place]) [I60.9] Ruptured cerebral aneurysm ([place]) [I60.7] Patient Active Problem List   Diagnosis Date Noted  . Obstructive hydrocephalus ([place])   . Seizure-like activity ([place]) 03/12/2020  . PEG (percutaneous endoscopic gastrostomy) status ([place]) 03/08/2020  . Hyperglycemia 03/08/2020  . Acute respiratory failure ([place])   . Ventilator dependence ([place])   . 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 Tracheostomy status ([place]) 02/19/2020  . Abdominal distention   . 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 SAH (subarachnoid hemorrhage) ([place])   . Subarachnoid bleed ([place])   . Tachypnea   . Prediabetes   . Hypokalemia   . Leukocytosis   . Ileus, postoperative ([place])   . Pressure injury of skin 02/13/2020  . Ruptured cerebral aneurysm ([place]) 01/30/2020  PCP:  No primary care provider on file. Pharmacy:  No Pharmacies Listed    Social Determinants of Health (SDOH) Interventions    Readmission Risk Interventions No flowsheet data found.

## 2020-03-22 NOTE — Plan of Care (Signed)
  Problem: Self-Care: Goal: Ability to communicate needs accurately will improve Outcome: Progressing   Problem: Nutrition: Goal: Risk of aspiration will decrease Outcome: Progressing Goal: Dietary intake will improve Outcome: Progressing   Problem: Spontaneous Subarachnoid Hemorrhage Tissue Perfusion: Goal: Complications of Spontaneous Subarachnoid Hemorrhage will be minimized Outcome: Progressing   Problem: Clinical Measurements: Goal: Ability to maintain clinical measurements within normal limits will improve Outcome: Progressing Goal: Will remain free from infection Outcome: Progressing Goal: Diagnostic test results will improve Outcome: Progressing Goal: Respiratory complications will improve Outcome: Progressing Goal: Cardiovascular complication will be avoided Outcome: Progressing   Problem: Activity: Goal: Risk for activity intolerance will decrease Outcome: Progressing   Problem: Nutrition: Goal: Adequate nutrition will be maintained Outcome: Progressing   Problem: Elimination: Goal: Will not experience complications related to bowel motility Outcome: Progressing Goal: Will not experience complications related to urinary retention Outcome: Progressing   Problem: Pain Managment: Goal: General experience of comfort will improve Outcome: Progressing   Problem: Safety: Goal: Ability to remain free from injury will improve Outcome: Progressing   Problem: Skin Integrity: Goal: Risk for impaired skin integrity will decrease Outcome: Progressing

## 2020-03-22 NOTE — Progress Notes (Signed)
[facility] Progress Note Patient Name: Stacy Moran Laser DOB: 1955/06/23 MRN: [id number]   Date of Service  03/22/2020  HPI/Events of Note  RN requesting a flexiseal and an order to send a c-diff sample  eICU Interventions  D/W RN and patient has not received the laxatives/stool softners for more than 2 days and has watery stool. C diff test ordered.      Intervention Category Minor Interventions: Clinical assessment - ordering diagnostic tests  Margaretmary Lombard 03/22/2020, 8:15 PM

## 2020-03-22 NOTE — Progress Notes (Signed)
BP (!) 155/98   Pulse 92   Temp 99.3 F (37.4 C) (Oral)   Resp (!) 21   Ht 5\' 9"  (1.753 m)   Wt 64 kg   SpO2 97%   BMI 20.84 kg/m  Alert, follows commands, moving all extremities Wounds which are multiple are all healing well Much improved since shunting progressing

## 2020-03-23 LAB — GLUCOSE, CAPILLARY
Glucose-Capillary: 121 mg/dL — ABNORMAL HIGH (ref 70–99)
Glucose-Capillary: 173 mg/dL — ABNORMAL HIGH (ref 70–99)
Glucose-Capillary: 136 mg/dL — ABNORMAL HIGH (ref 70–99)
Glucose-Capillary: 130 mg/dL — ABNORMAL HIGH (ref 70–99)
Glucose-Capillary: 120 mg/dL — ABNORMAL HIGH (ref 70–99)
Glucose-Capillary: 167 mg/dL — ABNORMAL HIGH (ref 70–99)

## 2020-03-23 MED ORDER — ORAL CARE MOUTH RINSE: 15.0000 mL | Freq: Two times a day (BID) | OROMUCOSAL | Status: DC

## 2020-03-23 MED ORDER — QUETIAPINE FUMARATE 50 MG PO TABS
25.0000 mg | ORAL_TABLET | Freq: Two times a day (BID) | ORAL | Status: DC
  Administered 2020-03-23 – 2020-04-22 (×60): 25 mg
  Filled 2020-03-23 (×62): qty 1

## 2020-03-23 MED ORDER — CHLORHEXIDINE GLUCONATE 0.12 % MT SOLN: 15.0000 mL | Freq: Two times a day (BID) | OROMUCOSAL | Status: DC

## 2020-03-23 NOTE — Progress Notes (Signed)
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 Pt denies removing eye. Discussed with Dr. Marcello Moores. Eye was present yesterday during my previous shift-03/22/2020.

## 2020-03-23 NOTE — Progress Notes (Signed)
Subjective: Patient reports no HA Objective: Vital signs in last 24 hours: Temp:  [98.2 F (36.8 C)-99.2 F (37.3 C)] 98.6 F (37 C) (12/26 1053) Pulse Rate:  [85-110] 90 (12/26 1000) Resp:  [18-30] 19 (12/26 1000) BP: (125-193)/(68-120) 148/82 (12/26 1000) SpO2:  [90 %-100 %] 99 % (12/26 1000) FiO2 (%):  [21 %] 21 % (12/26 0743) Weight:  [62.4 kg] 62.4 kg (12/26 0500)  Intake/Output from previous day: 12/25 0701 - 12/26 0700 In: 2220 [NG/GT:2220] Out: 253 [Urine:252; Stool:1] Intake/Output this shift: Total I/O In: 180 [NG/GT:180] Out: -   Alert, FC x 4 Stapled incisions clean   Lab Results: No results for input(s): WBC, HGB, HCT, PLT in the last 72 hours. BMET No results for input(s): NA, K, CL, CO2, GLUCOSE, BUN, CREATININE, CALCIUM in the last 72 hours.  Studies/Results: No results found.  Assessment/Plan: SAH s/p coiling, VPS - likely transfer to PCU - await Cdiff results  - awaiting SNF   Vallarie Mare 03/23/2020, 11:16 AM

## 2020-03-24 LAB — GLUCOSE, CAPILLARY
Glucose-Capillary: 111 mg/dL — ABNORMAL HIGH (ref 70–99)
Glucose-Capillary: 162 mg/dL — ABNORMAL HIGH (ref 70–99)
Glucose-Capillary: 121 mg/dL — ABNORMAL HIGH (ref 70–99)
Glucose-Capillary: 158 mg/dL — ABNORMAL HIGH (ref 70–99)
Glucose-Capillary: 136 mg/dL — ABNORMAL HIGH (ref 70–99)
Glucose-Capillary: 118 mg/dL — ABNORMAL HIGH (ref 70–99)

## 2020-03-24 LAB — T4, FREE: Free T4: 1.06 ng/dL (ref 0.61–1.12)

## 2020-03-24 LAB — BASIC METABOLIC PANEL
Anion gap: 8 (ref 5–15)
BUN: 15 mg/dL (ref 8–23)
CO2: 29 mmol/L (ref 22–32)
Calcium: 9.5 mg/dL (ref 8.9–10.3)
Chloride: 101 mmol/L (ref 98–111)
Creatinine, Ser: 0.43 mg/dL — ABNORMAL LOW (ref 0.44–1.00)
GFR, Estimated: >60 mL/min (ref >60)
Glucose, Bld: 151 mg/dL — ABNORMAL HIGH (ref 70–99)
Potassium: 4.2 mmol/L (ref 3.5–5.1)
Sodium: 138 mmol/L (ref 135–145)

## 2020-03-24 LAB — TSH: TSH: 32.655 u[IU]/mL — ABNORMAL HIGH (ref 0.350–4.500)

## 2020-03-24 MED ORDER — POLYETHYLENE GLYCOL 3350 17 G PO PACK: 17.0000 g | PACK | Freq: Every day | ORAL | Status: DC | PRN

## 2020-03-24 MED ORDER — DOCUSATE SODIUM 50 MG/5ML PO LIQD
100.0000 mg | Freq: Two times a day (BID) | ORAL | Status: DC | PRN
  Administered 2020-04-05 – 2020-04-06 (×2): 100 mg
  Filled 2020-03-24 (×2): qty 10

## 2020-03-24 NOTE — Consult Note (Signed)
WOC Nurse Consult Note: Patient receiving care in [facility] 4N16.  Skin breakdown around tracheostomy nearly healed. Patient with minimal secretions. Area requiring routine tracheostomy care at this time. WOC nurse will not follow at this time.  Please re-consult the WOC team if needed.  Helmut Muster, RN, MSN, CWOCN, CNS-BC, pager [phone]

## 2020-03-24 NOTE — Progress Notes (Signed)
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  Required tracheostomy for airway protection.  No significant past medical history.  Significant Hospital Events   11/03 Admitted with [facility] 11/04 Craniotomy with clipping of left PICA 11/12 Total thyroidectomy, tracheostomy 11/13 start ABx for fever and tracheobronchitis 11/16 Now 11 days post clipping.  Drain stopped yesterday so left clamped.  Was interactive with PT yesterday. This morning, agitated and complaining of back pain. More somnolent following fentanyl. Neuro stable.  11/21 Pulm called back again;  trach bleeding. ENT consulted 11/23 Subglottic obstruction which is new observed on MBS>CT neck ordered to r/o infection. Also abx started. As she failed MBS IR consulted for PEG. Should be last day for nimodipine. We asked IM to assist w. care  12/04  Put on mechanical ventilation and transferred to ICU 12/06 Tolerating PS 12/15 Seizure prompting MRI showing worsening hydrocephalus 12/16 Transferred to ICU for bedside ventriculostomy 12/21 Laparoscopic assisted insertion of VP shunt  Consults:  ENT (Dr.Rosen) Palliative care Neurology >> s/o 12/17  Procedures:  11/03 ETT >>11/09 11/03 EVD >> 11/09 ETT >> 11/12 11/12 Tracheostomy >> 12/16 Ventriculostomy >>  Significant Diagnostic Tests:   11/3 CT head > large volume subarachnoid related to ruptured left PICA aneurysm, intraventricular reflux with hydrocephalus, chronic small vessel disease, chronic small vessel disease  11/9 H&N U/S > 2.3x2.3x2.4 mass in the left neck along the left thyroid lobe  11/27 CT head > Resolution of subarachnoid hemorrhage, stable subdural hematoma  11/29 CT head > Unchanged size of right subdural hematoma with minimal  leftward midline shift  12/15 MRI brain > 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 Redemonstration of a right cerebral convexity fluid collection which measures up to 1.6 cm, which is likely increased from prior when comparing across modalities. Leftward midline shift is also slightly increased, now measuring approximately 6 mm. Interval development of obstructive hydrocephalus  12/19 MRV head > no venous thrombosis, diminutive Rt transverse sinus, Rt SDH 2.1 cm, thin subdural collections posterior fossa  Micro Data:  11/03 SARS2/ Flu > neg 11/08 respiratory culture >> negative 11/08 blood culture >>negative 11/11 urine culture >> multiple species 11/12 MRSA PCR >> negative 11/12 sputum: staph aureus, Citrobacter and Haemophilus parainfluenza (B lactamase +)  11/22 sputum >> multiple organism, no staph or strep 12/4 Sputum>> negative and gram-positive rods 12/4 wound trach site > neg 12/8 sputum > MSSA  Antimicrobials:  Zosyn 11/13 >> 11/14 Ceftriaxone 11/14 >> 11/20 Ancef 12/13 >> 12/19  Interim history/subjective:  Afebrile  On 21% ATC / 5L  C-diff ordered on 12/25 but not collected / no further diarrhea > no fever, no WBC elevation, limited volume stool and is on two laxatives  Objective   Blood pressure 124/74, pulse 90, temperature 98.4 F (36.9 C), temperature source Axillary, resp. rate 19, height 5\' 9"  (1.753 m), weight 62.4 kg, SpO2 100 %.    FiO2 (%):  [21 %] 21 %   Intake/Output Summary (Last 24 hours) at 03/24/2020 1137 Last data filed at 03/24/2020 1000 Gross per 24 hour  Intake 2734 ml  Output 150 ml  Net 2584 ml   Filed Weights   03/11/20 0500 03/23/20 0500 03/24/20 0500  Weight: 64 kg  62.4 kg 62.4 kg    Examination: General: chronically ill appearing adult female lying in bed in NAD  HEENT: MM pink/moist, cuffed trach midline c/d/i, on ATC, surgical incision on head clean with  staples, right eye wnl / anicteric, left eye enucleated  Neuro: awakens to voice, nods / appropriate, follows commands CV: s1s2 rrr, SR 80's, no m/r/g PULM: non-labored on ATC, lungs bilaterally with occasional rhonchi  GI: soft, bsx4 active  Extremities: warm/dry, no edema  Skin: no rashes or lesions  Resolved Hospital Problem list   Recurrent tracheobronchitis-recurrent from  Beta lactamase + H flu, Citrobacter and S.aureus are susceptible.: Patient completed 7 days treatment with ceftriaxone, Trach site bleeding   Assessment & Plan:   Compromised airway s/p tracheostomy. 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 Atypical hygromas. S/p VP shunt 12/21 -per NSGY   Diarrhea  No fever, no WBC, on bid colace + miralax with small volume stool -discontinue C-Diff assessment  -pt is clinically stable and does not have evidence of infection   Seizures -Keppra per NSGY   Agitation, insomnia. 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 -synthroid   Hyperglycemia w/o history of DM. -SSI   HTN -SBP goal <160  -continue amlodipine, labetalol, hydralazine   Anemia of critical illness. -trend CBC -transfuse for Hgb <7% or active bleeding   Goals of care. -Palliative Care following -continue aggressive care  Best Practice:  Nutrition: tube feeds DVT prophylaxis: SQ heparin SUP: protonix Mobility: PT/OT >> rec's SNF when able Code status: full code Disposition: Progressive   PCCM will follow intermittently for trach care.  Call if help needed in between.  Labs:   CMP Latest Ref Rng & Units 03/24/2020 03/15/2020 03/14/2020  Glucose 70 - 99 mg/dL 322(G) 254(Y) -  BUN 8 - 23 mg/dL 15 23 -  Creatinine 7.06 - 1.00 mg/dL 2.37(S) 2.83 -  Sodium 135 - 145 mmol/L 138 138 -  Potassium 3.5 - 5.1 mmol/L 4.2 4.1 4.4   Chloride 98 - 111 mmol/L 101 100 -  CO2 22 - 32 mmol/L 29 27 -  Calcium 8.9 - 10.3 mg/dL 9.5 9.6 -  Total Protein 6.5 - 8.1 g/dL - - -  Total Bilirubin 0.3 - 1.2 mg/dL - - -  Alkaline Phos 38 - 126 U/L - - -  AST 15 - 41 U/L - - -  ALT 0 - 44 U/L - - -    CBC Latest Ref Rng & Units 03/15/2020 03/14/2020 03/13/2020  WBC 4.0 - 10.5 K/uL 6.1 6.7 8.1  Hemoglobin 12.0 - 15.0 g/dL 10.1(L) 10.2(L) 10.7(L)  Hematocrit 36.0 - 46.0 % 33.3(L) 33.3(L) 33.7(L)  Platelets 150 - 400 K/uL 277 270 267    ABG    Component Value Date/Time   PHART 7.444 02/06/2020 0843   PCO2ART 42.8 02/06/2020 0843   PO2ART 157 (H) 02/06/2020 0843   HCO3 29.2 (H) 02/06/2020 0843   TCO2 30 02/06/2020 0843   ACIDBASEDEF 4.0 (H) 01/31/2020 0113   O2SAT 99.0 02/06/2020 0843    CBG (last 3)  Recent Labs    03/23/20 2308 03/24/20 0326 03/24/20 0809  GLUCAP 167* 111* 162*    Lab Results  Component Value Date   TSH 0.118 (L) 02/07/2020    Lab Results  Component Value Date   HGBA1C 6.2 (H) 01/30/2020  Signature:   Noe Gens, MSN, NP-C, AGACNP-BC [facility] 03/24/2020, 11:38 AM   Please see [email or web address] for pager details.

## 2020-03-24 NOTE — Progress Notes (Signed)
Subjective: Patient reports No complaints this morning  Objective: Vital signs in last 24 hours: Temp:  [98.4 F (36.9 C)-99.5 F (37.5 C)] 98.4 F (36.9 C) (12/27 0000) Pulse Rate:  [82-106] 90 (12/27 1128) Resp:  [16-23] 21 (12/27 1200) BP: (77-167)/(56-121) 143/79 (12/27 1200) SpO2:  [92 %-100 %] 100 % (12/27 1128) FiO2 (%):  [21 %] 21 % (12/27 1128) Weight:  [62.4 kg] 62.4 kg (12/27 0500)  Intake/Output from previous day: 12/26 0701 - 12/27 0700 In: 2734 [NG/GT:2734] Out: 150 [Urine:150] Intake/Output this shift: Total I/O In: 300 [NG/GT:300] Out: -   Awakens to voice neurologically stable  Lab Results: No results for input(s): WBC, HGB, HCT, PLT in the last 72 hours. BMET Recent Labs    03/24/20 0349  NA 138  K 4.2  CL 101  CO2 29  GLUCOSE 151*  BUN 15  CREATININE 0.43*  CALCIUM 9.5    Studies/Results: No results found.  Assessment/Plan: Transferring to progressive awaiting placement continue physical occupational rehab  LOS: 54 days     Mariam Dollar 03/24/2020, 12:26 PM

## 2020-03-24 NOTE — Progress Notes (Signed)
RT assisted Dr. Chestine Spore with bedside trach change.  Pt's trach was changed from a #6 cuffed shiley to a #6 cuffless shiley, obturator at the bedside, pt tolerated the change well, positive ETCO2, color change present, BBS noted.  RT will continue to monitor.

## 2020-03-24 NOTE — Progress Notes (Signed)
Physical Therapy Treatment Patient Details Name: Stacy Moran MRN: [id number] DOB: Aug 26, 1955 Today's Date: 03/24/2020    History of Present Illness Pt is a 64 y/o female with no known PHX admitted with sudden onset of HA, BP 262/123, Imaging showing large volume SAH due to a ruptured L PICA aneurysm associated with intraventricular hydrocephalus.  11/4, pt s/p L far-lateral craniotomy for clipping of PICA aneurysm. 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 12/4 pt with desat and bradycardia and was transferred to ICU and placed on the vent. Pt off vent 12/6. 12/15 seizure prompting MRI showing worsening hydrocephalus. 12/16 transferred to ICU for bedside ventriculostomy. Pt is now s/p VP shunt placement on 12/21.    PT Comments    The pt was willing to participate in PT session at this time, and was able to demo significant improvements in upright activity tolerance from prior session. She was able to complete bed mobility and initial transfers OOB with minA of 1-2 and HHA of 1 to power up and steady. The pt continues to benefit from [facility] to manage posterior lean, and to initiate wt shift for lateral steps, but was able to do so with assist of 1 only at this time. The pt also completed 5xsit-stand prior to onset of fatigue requiring return to supine with minA for balance and max encouragement. The pt will continue to benefit from skilled PT to further progress mobility, strength, power, and dynamic stability.     Follow Up Recommendations  SNF;Supervision/Assistance - 24 hour     Equipment Recommendations  Wheelchair (measurements PT);Wheelchair cushion (measurements PT);3in1 (PT)    Recommendations for Other Services       Precautions / Restrictions Precautions Precautions: Fall Precaution Comments: trach, mits, BUE restraints, retraint belt Restrictions Weight Bearing Restrictions: No    Mobility  Bed Mobility Overal  bed mobility: Needs Assistance Bed Mobility: Supine to Sit;Sit to Supine     Supine to sit: Min assist;+2 for physical assistance;+2 for safety/equipment Sit to supine: Min assist;+2 for physical assistance;+2 for safety/equipment   General bed mobility comments: minA with increased time and repeated cues to move BLE to EOB. then minA pulling on PT with HHA to pull to sit. improved mobility with return to supine, but pt continues to require increased time and effort to accurately move BLE back into bed  Transfers Overall transfer level: Needs assistance Equipment used: 1 person hand held assist Transfers: Sit to/from Stand Sit to Stand: Min assist         General transfer comment: pt able to power up x8 through session when given verbal cues and increased processing time. HHA of 1 to steady in standing.  Ambulation/Gait Ambulation/Gait assistance: Min guard Gait Distance (Feet): 5 Feet Assistive device: 1 person hand held assist Gait Pattern/deviations: Step-to pattern;Decreased stride length;Shuffle;Leaning posteriorly;Narrow base of support Gait velocity: decreased Gait velocity interpretation: <1.31 ft/sec, indicative of household ambulator General Gait Details: small lateral steps with HHA of 1 and repeated verbal cues and max encouragement.    Modified Rankin (Stroke Patients Only) Modified Rankin (Stroke Patients Only) Pre-Morbid Rankin Score: No symptoms Modified Rankin: Moderately severe disability     Balance Overall balance assessment: Needs assistance Sitting-balance support: Feet supported;Bilateral upper extremity supported Sitting balance-Leahy Scale: Poor Sitting balance - Comments: typically reliant on external assist, fluctating levels   Standing balance support: Bilateral upper extremity supported;During functional activity Standing balance-Leahy Scale: Poor Standing balance comment: external assist for standing balance and  UE support                             Cognition Arousal/Alertness: Awake/alert Behavior During Therapy: Restless;Impulsive Overall Cognitive Status: Impaired/Different from baseline Area of Impairment: Attention;Memory;Following commands;Awareness;Safety/judgement;Problem solving                   Current Attention Level: Focused Memory: Decreased recall of precautions;Decreased short-term memory Following Commands: Follows one step commands inconsistently;Follows one step commands with increased time Safety/Judgement: Decreased awareness of safety;Decreased awareness of deficits Awareness: Emergent Problem Solving: Slow processing;Decreased initiation;Difficulty sequencing;Requires verbal cues;Requires tactile cues General Comments: inconsistent command following, but improved from prior sessions. Pt restless in bed and with frequent reaching/pulling on lines. able to be redirected, but requires constant attention and repeated cues      Exercises Other Exercises Other Exercises: 5xSTS from EOB with HHA of 1 to steady    General Comments General comments (skin integrity, edema, etc.): VSS through session on trach collar.      Pertinent Vitals/Pain Pain Assessment: No/denies pain Pain Intervention(s): Monitored during session;Limited activity within patient's tolerance;Repositioned           PT Goals (current goals can now be found in the care plan section) Acute Rehab PT Goals Patient Stated Goal: none stated PT Goal Formulation: With patient Time For Goal Achievement: 03/28/20 Potential to Achieve Goals: Fair Progress towards PT goals: Progressing toward goals    Frequency    Min 2X/week      PT Plan Current plan remains appropriate       AM-PAC PT "6 Clicks" Mobility   Outcome Measure    Help needed moving from lying on your back to sitting on the side of a flat bed without using bedrails?: A Little Help needed moving to and from a bed to a chair (including a  wheelchair)?: A Little Help needed standing up from a chair using your arms (e.g., wheelchair or bedside chair)?: A Little Help needed to walk in hospital room?: A Lot Help needed climbing 3-5 steps with a railing? : A Lot 6 Click Score: 13    End of Session Equipment Utilized During Treatment: Gait belt;Oxygen Activity Tolerance: Patient tolerated treatment well;Patient limited by fatigue Patient left: in bed;with restraints reapplied (lab tech present) Nurse Communication: Mobility status PT Visit Diagnosis: Hemiplegia and hemiparesis;Other abnormalities of gait and mobility (R26.89);Other symptoms and signs involving the nervous system (R29.898) Hemiplegia - Right/Left: Left Hemiplegia - dominant/non-dominant: Non-dominant Hemiplegia - caused by: Other Nontraumatic intracranial hemorrhage     Time: 1335-1404 PT Time Calculation (min) (ACUTE ONLY): 29 min  Charges:  $Therapeutic Activity: 23-37 mins                     Rolm Baptise, PT, DPT   Acute Rehabilitation Department Pager #: [phone]   Gaetana Michaelis 03/24/2020, 2:28 PM

## 2020-03-25 LAB — GLUCOSE, CAPILLARY
Glucose-Capillary: 144 mg/dL — ABNORMAL HIGH (ref 70–99)
Glucose-Capillary: 150 mg/dL — ABNORMAL HIGH (ref 70–99)
Glucose-Capillary: 160 mg/dL — ABNORMAL HIGH (ref 70–99)
Glucose-Capillary: 166 mg/dL — ABNORMAL HIGH (ref 70–99)
Glucose-Capillary: 91 mg/dL (ref 70–99)
Glucose-Capillary: 97 mg/dL (ref 70–99)

## 2020-03-25 NOTE — TOC Progression Note (Signed)
Transition of Care [facility]) - Progression Note    Patient Details  Name: Stacy Moran MRN: [id number] Date of Birth: 1955/04/04  Transition of Care [facility]) CM/SW Contact  Eduard Roux, [place] Phone Number: 03/25/2020, 10:47 AM  Clinical Narrative:     Patint will be difficult to place-barriers - has no payor source for now (medicaid potential), currently in restraints-   CSW will continue to follow and assist with discharge planning.  Antony Blackbird, MSW, LCSW Clinical Social Worker   Expected Discharge Plan: Home w Home Health Services Barriers to Discharge: Inadequate or no insurance  Expected Discharge Plan and Services Expected Discharge Plan: Home w Home Health Services       Living arrangements for the past 2 months:  (In hospital the last 52 days)                                       Social Determinants of Health (SDOH) Interventions    Readmission Risk Interventions No flowsheet data found.

## 2020-03-25 NOTE — Progress Notes (Signed)
Nutrition Follow-up  DOCUMENTATION CODES:   Not applicable  INTERVENTION:  Continue tube feeds via PEG: - Osmolite 1.2 @ 60 ml/hr (1440 ml/day) - ProSource TF 45 ml BID  Tube feeding regimen provides1808kcal, 102grams of protein, and 11110m of H2O.   Continue 200 ml free water every 6 hours per tube. Total free water: 1981 ml/day  NUTRITION DIAGNOSIS:   Inadequate oral intake related to inability to eat as evidenced by NPO status; ongoing  GOAL:   Patient will meet greater than or equal to 90% of their needs; met with TF  MONITOR:   TF tolerance,Skin,Weight trends,Labs,I & O's  REASON FOR ASSESSMENT:   Ventilator    ASSESSMENT:   64year old female who presented to the ED on 11/03 with headache. 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 Pt required intubation for airway protection.  11/03- admitted 11/04-SAH, L PICA aneurysm to OR for craniotomy, clipping  11/09-extubated but immediately re-intubated 11/12-s/p thyroidectomy with tracheostomy; cortrak placed(tip gastric) 11/16-EVD removed 11/17-trach exchange to facilitate PMV 11/20- Cortrakremoved by pt 11/22-trach change (pt self-decannulated) 11/26-PEG placed 12/01 - MBS with recommendation for NPO 12/03 - s/p direct laryngoscopy 12/04 - transferred to ICU, put on mechanical ventilation 12/07 - tolerating TCT; transferred to 3W 12/16 - MRI shows increased hydrocephalus; tx ICU for EVD placement.  12/20 - s/p shunt placement    Pt asleep during time of visit and did not awaken to RD visit. Pt continues on trach collar and NPO status. Pt tolerating her tube feeds well. RD to continue with current tube feeding orders. Labs and medications reviewed.   Diet Order:   Diet Order            Diet NPO time specified  Diet effective midnight                 EDUCATION NEEDS:   No education needs have been identified at this  time  Skin:  Skin Assessment: Reviewed RN Assessment Skin Integrity Issues:: Stage II,Other (Comment),Incisions Stage II: throat Incisions: neck, head Other: skin tear back  Last BM:  12/27  Height:   Ht Readings from Last 1 Encounters:  03/04/20 5' 9"  (1.753 m)    Weight:   Wt Readings from Last 1 Encounters:  03/24/20 62.4 kg    Ideal Body Weight:  65.9 kg  BMI:  Body mass index is 20.32 kg/m.  Estimated Nutritional Needs:   Kcal:  1700-1900  Protein:  95-110 grams  Fluid:  > 1.7 L/day  SCorrin Parker MS, RD, LDN RD pager number/after hours weekend pager number on Amion.

## 2020-03-25 NOTE — Progress Notes (Signed)
  Speech Language Pathology Treatment: Dysphagia;Hillary Bow Speaking valve;Cognitive-Linquistic  Patient Details Name: Stacy Moran MRN: [id number] DOB: 25-Dec-1955 Today's Date: 03/25/2020 Time: 4098-1191 SLP Time Calculation (min) (ACUTE ONLY): 14 min  Assessment / Plan / Recommendation Clinical Impression  Today pt was awake but only intermittently responsive to questions, requests. Intermittently followed commands 30%. Congestion audible, PMV donned and wore approximately 7 minutes without detection of air trapping. Vitals stable and confirmed she feels she is breathing well with valve. Verbal requests and demonstration provided for throat clears- she attempted but was not able to adducts cords to clear. Oral hygiene performed but did not attempt po trials during therapy. She did not attempt to verbally respond to questions today. Given lack of progress, ST will decrease frequency to 1/week.    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 Admitted on 11/3, Crani on 11/4, vented until 11/12 when pt was trached. Also found to have multinodular goiter complicating respiratory failure so total thyroidectomy also completed on 11/12. Pt had been unable to functionaly utilize PMV due to copious secretions with inability to mobilize and severity of "pre stenosis" limiteds airflow severely. She became increasingly lethargic and unable to participate in therapy 12/9-12/17- and found to have seizure activity, acute right infarcts and obstructive hydrocephalus and shunt placed 12/20 (unable to obtain OR slot prior). ST orders were discontinued and now re-ordered post op.      SLP Plan  Continue with current plan of care (downgrade to 1 time a week)       Recommendations  Diet recommendations: NPO Medication Administration: Via alternative means      Patient may use Passy-Muir Speech Valve: with SLP only PMSV Supervision: Full          Oral Care Recommendations: Oral care QID Follow up Recommendations: Skilled Nursing facility;LTACH SLP Visit Diagnosis: Cognitive communication deficit (R41.841);Aphonia (R49.1);Dysphagia, oropharyngeal phase (R13.12) Plan: Continue with current plan of care (downgrade to 1 time a week)                       Royce Macadamia 03/25/2020, 2:08 PM   Breck Coons Lonell Face.Ed Nurse, children's [phone] Office [phone]

## 2020-03-25 NOTE — TOC Progression Note (Signed)
Transition of Care [facility]) - Progression Note    Patient Details  Name: Deashia Soule MRN: [id number] Date of Birth: [birth year]  [facility]) CM/SW Contact  Eduard Roux, [place] Phone Number: 03/25/2020, 11:57 AM  Clinical Narrative:     Called patient's daughter,[name], left voice message to return call.   Antony Blackbird, MSW, LCSW Clinical Social Worker   Expected Discharge Plan: Home w Home Health Services Barriers to Discharge: Inadequate or no insurance  Expected Discharge Plan and Services Expected Discharge Plan: Home w Home Health Services       Living arrangements for the past 2 months:  (In hospital the last 52 days)                                       Social Determinants of Health (SDOH) Interventions    Readmission Risk Interventions No flowsheet data found.

## 2020-03-25 NOTE — Progress Notes (Signed)
  NEUROSURGERY PROGRESS NOTE   No issues overnight.  Mouths responses No concerns this am  EXAM:  BP (!) 141/88   Pulse 92   Temp 98.2 F (36.8 C) (Oral)   Resp (!) 22   Ht 5\' 9"  (1.753 m)   Wt 62.4 kg   SpO2 97%   BMI 20.32 kg/m   Awake, alert Mouths responses Nods appropriately MAEW Wounds c/d/i  IMPRESSION/PLAN 64 y.o. 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 Appears to be at neurologic baseline - continue supportive care - Medically ready for SNF

## 2020-03-26 LAB — GLUCOSE, CAPILLARY
Glucose-Capillary: 135 mg/dL — ABNORMAL HIGH (ref 70–99)
Glucose-Capillary: 114 mg/dL — ABNORMAL HIGH (ref 70–99)
Glucose-Capillary: 142 mg/dL — ABNORMAL HIGH (ref 70–99)
Glucose-Capillary: 86 mg/dL (ref 70–99)
Glucose-Capillary: 105 mg/dL — ABNORMAL HIGH (ref 70–99)
Glucose-Capillary: 148 mg/dL — ABNORMAL HIGH (ref 70–99)

## 2020-03-26 NOTE — Progress Notes (Signed)
OT Cancellation Note  Patient Details Name: Stacy Moran MRN: [id number] DOB: 12/06/1955   Cancelled Treatment:    Reason Eval/Treat Not Completed: Patient declined, no reason specified; pt shaking her head "no" in response to working with OT this afternoon. Will follow up for OT treatment as able.  Marcy Siren, OT Acute Rehabilitation Services Pager [phone] Office [phone]   Orlando Penner 03/26/2020, 4:41 PM

## 2020-03-26 NOTE — TOC Initial Note (Signed)
Transition of Care [facility]) - Initial/Assessment Note    Patient Details  Name: Stacy Moran MRN: [id number] Date of Birth: [birth year]  [facility]) CM/SW Contact:    Eduard Roux, LCSWA Phone Number: 03/26/2020, 12:30 PM  Clinical Narrative:      CSW spoke with patient's daughter,Stacy Moran via phone. CSW introduced self and explained role. She explained the patient lived home alone. CSW discussed recommendation of short term rehab at [facility] Gerline Legacy is agreeable to SNF and has no SNF preference. CSW explained the SNF process and potential barriers to placement. No questions or concerns noted at this time.    CSW will start the SNF process  CSW will follow up with Financial counselor, regarding if medicaid application has been placed. CSW will continue to follow and assist with discharge planning.  Antony Blackbird, MSW, LCSW Clinical Social Worker   Expected Discharge Plan: Skilled Nursing Facility Barriers to Discharge: Inadequate or no insurance,Continued Medical Work up,SNF Pending Medicaid,SNF Pending bed offer   Patient Goals and CMS Choice        Expected Discharge Plan and Services Expected Discharge Plan: Skilled Nursing Facility In-house Referral: Clinical Social Work     Living arrangements for the past 2 months:  (In hospital the last 52 days)                                      Prior Living Arrangements/Services Living arrangements for the past 2 months:  (In hospital the last 52 days) Lives with:: Self Patient language and need for interpreter reviewed:: No        Need for Family Participation in Patient Care: Yes (Comment) Care giver support system in place?: Yes (comment)   Criminal Activity/Legal Involvement Pertinent to Current Situation/Hospitalization: No - Comment as needed  Activities of Daily Living      Permission Sought/Granted                  Emotional Assessment       Orientation: : Oriented to  Self Alcohol / Substance Use: Not Applicable Psych Involvement: No (comment)  Admission diagnosis:  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 Obstructive hydrocephalus (HCC)   . Seizure-like activity (HCC) 03/12/2020  . PEG (percutaneous endoscopic gastrostomy) status (HCC) 03/08/2020  . Hyperglycemia 03/08/2020  . Acute respiratory failure (HCC)   . Ventilator dependence (HCC)   . 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 Tracheostomy status (HCC) 02/19/2020  . Abdominal distention   . Ruptured aneurysm of artery (HCC)   . SAH (subarachnoid hemorrhage) (HCC)   . Subarachnoid bleed (HCC)   . Tachypnea   . Prediabetes   . Hypokalemia   . Leukocytosis   . Ileus, postoperative (HCC)   . Pressure injury of skin 02/13/2020  . 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 Pharmacy:  No Pharmacies Listed    Social Determinants of Health (SDOH) Interventions    Readmission Risk Interventions No flowsheet data found.

## 2020-03-26 NOTE — Progress Notes (Signed)
  NEUROSURGERY PROGRESS NOTE   No issues overnight. No concerns this am  EXAM:  BP 124/75 (BP Location: Right Arm)   Pulse 86   Temp 97.8 F (36.6 C) (Axillary)   Resp 17   Ht 5\' 9"  (1.753 m)   Wt 61.7 kg   SpO2 100%   BMI 20.09 kg/m   s\lkeeping when I entered Awakened to voice Mouths responses Nods appropriately MAEW Wounds c/d/i  IMPRESSION/PLAN 64 y.o. 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 Appears to be at neurologic baseline - continue supportive care - barrier to SNF d/c: no insurance. Appreciate CSW assistance. Ready when this is sorted out from a medical sand point.

## 2020-03-26 NOTE — TOC Progression Note (Signed)
Transition of Care [facility]) - Progression Note    Patient Details  Name: Kikuye Korenek MRN: [id number] Date of Birth: [birth year]  Transition [facility] Care [facility]) CM/SW Contact  Eduard Roux, [place] Phone Number: 03/26/2020, 4:10 PM  Clinical Narrative:     Emailed financial counselor to determine/confirm if medicaid application is pending. Waiting on response.  Antony Blackbird, MSW, LCSW Clinical Social Worker    Expected Discharge Plan: Skilled Nursing Facility Barriers to Discharge: Inadequate or no insurance,Continued Medical Work up,SNF Pending Medicaid,SNF Pending bed offer  Expected Discharge Plan and Services Expected Discharge Plan: Skilled Nursing Facility In-house Referral: Clinical Social Work     Living arrangements for the past 2 months:  (In hospital the last 52 days)                                       Social Determinants of Health (SDOH) Interventions    Readmission Risk Interventions No flowsheet data found.

## 2020-03-27 LAB — GLUCOSE, CAPILLARY
Glucose-Capillary: 81 mg/dL (ref 70–99)
Glucose-Capillary: 167 mg/dL — ABNORMAL HIGH (ref 70–99)
Glucose-Capillary: 144 mg/dL — ABNORMAL HIGH (ref 70–99)
Glucose-Capillary: 105 mg/dL — ABNORMAL HIGH (ref 70–99)
Glucose-Capillary: 154 mg/dL — ABNORMAL HIGH (ref 70–99)
Glucose-Capillary: 86 mg/dL (ref 70–99)

## 2020-03-27 NOTE — Progress Notes (Signed)
  NEUROSURGERY PROGRESS NOTE   No issues overnight.   EXAM:  BP 120/80 (BP Location: Left Arm)   Pulse 81   Temp 98 F (36.7 C) (Axillary)   Resp 19   Ht 5\' 9"  (1.753 m)   Wt 59.9 kg   SpO2 96%   BMI 19.50 kg/m   Awakened, alert Mouths responses Nods appropriately MAEW Wounds c/d/i  IMPRESSION/PLAN 64 y.o. 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 Appears to be at neurologic baseline - continue supportive care - barrier to SNF d/c: no insurance. Appreciate CSW assistance. Ready when this is sorted out from a medical sand point.

## 2020-03-28 LAB — GLUCOSE, CAPILLARY
Glucose-Capillary: 140 mg/dL — ABNORMAL HIGH (ref 70–99)
Glucose-Capillary: 171 mg/dL — ABNORMAL HIGH (ref 70–99)
Glucose-Capillary: 82 mg/dL (ref 70–99)
Glucose-Capillary: 143 mg/dL — ABNORMAL HIGH (ref 70–99)
Glucose-Capillary: 155 mg/dL — ABNORMAL HIGH (ref 70–99)

## 2020-03-28 NOTE — Progress Notes (Signed)
   Providing Compassionate, Quality Care - Together  NEUROSURGERY PROGRESS NOTE   S: No issues overnight.   O: EXAM:  BP 129/76 (BP Location: Left Arm)   Pulse 75   Temp 98.7 F (37.1 C) (Oral)   Resp 18   Ht 5\' 9"  (1.753 m)   Wt 59.9 kg   SpO2 97%   BMI 19.50 kg/m   Awake, alert, Mouths responses appropriately Nods to yes/no questions Moves all extremities Wounds clean dry and intact  ASSESSMENT:  64 y.o. female with  PICA aneurysmal rupture, status post clipping, status post VP shunt for hydrocephalus  PLAN: -Continue supportive care, awaiting placement for rehab    Thank you for allowing me to participate in this patient's care.  Please do not hesitate to call with questions or concerns.   70, DO Neurosurgeon [facility] Cell: [phone]

## 2020-03-28 NOTE — Progress Notes (Signed)
Physical Therapy Treatment Patient Details Name: Stacy Moran MRN: [id number] DOB: [birth year] Today's Date: 03/28/2020    History of Present Illness Pt is a 64 y/o female with no known PHX admitted with sudden onset of HA, BP 262/123, Imaging showing large volume SAH due to a ruptured L PICA aneurysm associated with intraventricular hydrocephalus.  11/4, pt s/p L far-lateral craniotomy for clipping of PICA aneurysm. 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 12/4 pt with desat and bradycardia and was transferred to ICU and placed on the vent. Pt off vent 12/6. 12/15 seizure prompting MRI showing worsening hydrocephalus. 12/16 transferred to ICU for bedside ventriculostomy. Pt is now s/p VP shunt placement on 12/21.    PT Comments    The pt was willing to participate in session with focus on progression of OOB mobility and activity tolerance. The pt was able to initiate movement to sitting EOB, but does require minA to manage LE and trunk elevation from bed as well as seated stability at times due to R lateral lean. The pt was able to complete multiple stands from EOB, but continues to require HHA and minA to facilitate anterior lean to initiate stand as well as to steady in standing. The pt was able to take small lateral steps along HOB, but requires mod/maxA to facilitate wt shift and complete steps at this time. Limited by pt reports of fatigue. The pt will continue to benefit from skilled PT to progress functional strength and power and stability for transfers.     Follow Up Recommendations  SNF;Supervision/Assistance - 24 hour     Equipment Recommendations  Wheelchair (measurements PT);Wheelchair cushion (measurements PT);3in1 (PT)    Recommendations for Other Services       Precautions / Restrictions Precautions Precautions: Fall Precaution Comments: trach, bilateral UE mitts, sitter Restrictions Weight Bearing  Restrictions: No    Mobility  Bed Mobility Overal bed mobility: Needs Assistance Bed Mobility: Supine to Sit;Sit to Supine     Supine to sit: Min assist;HOB elevated Sit to supine: Mod assist   General bed mobility comments: minA with increased time to come to sitting EOB, minA to BLE, pt reaching for PT to pull to sit. able to steady with UE support  Transfers Overall transfer level: Needs assistance Equipment used: 1 person hand held assist Transfers: Sit to/from [employer] Sit to Stand: Min assist Stand pivot transfers: Min assist       General transfer comment: pt able to power up x5 through session with HHA of 1 and tactile cues at hips to achieve full stand. min/modA to maintain due to posterior lean  Ambulation/Gait Ambulation/Gait assistance: Mod assist Gait Distance (Feet): 3 Feet Assistive device: 1 person hand held assist Gait Pattern/deviations: Step-to pattern;Decreased stride length;Shuffle;Leaning posteriorly;Narrow base of support Gait velocity: decreased Gait velocity interpretation: <1.31 ft/sec, indicative of household ambulator General Gait Details: small lateral steps with HHA of 1 and repeated verbal cues and max encouragement.   Modified Rankin (Stroke Patients Only) Modified Rankin (Stroke Patients Only) Pre-Morbid Rankin Score: No symptoms Modified Rankin: Moderately severe disability     Balance Overall balance assessment: Needs assistance Sitting-balance support: Feet supported;Bilateral upper extremity supported Sitting balance-Leahy Scale: Poor Sitting balance - Comments: typically reliant on external assist, fluctating levels Postural control: Right lateral lean Standing balance support: Bilateral upper extremity supported;During functional activity Standing balance-Leahy Scale: Poor Standing balance comment: external assist for standing balance and UE support  Cognition  Arousal/Alertness: Awake/alert Behavior During Therapy: Flat affect Overall Cognitive Status: Impaired/Different from baseline Area of Impairment: Attention;Memory;Following commands;Safety/judgement;Awareness;Problem solving                   Current Attention Level: Focused Memory: Decreased short-term memory Following Commands: Follows one step commands inconsistently;Follows one step commands with increased time Safety/Judgement: Decreased awareness of safety;Decreased awareness of deficits Awareness: Emergent Problem Solving: Slow processing;Decreased initiation;Difficulty sequencing;Requires verbal cues;Requires tactile cues General Comments: pt calmer upon arrival of PT today, restless in sitting due to pt reports of being cold, turning and reaching for blankets, but increased calm and responsiveness when covered. Followed all commands, some with increased time      Exercises General Exercises - Lower Extremity Ankle Circles/Pumps: AROM;Both;10 reps;Seated Long Arc Quad: AROM;Both;20 reps;Seated Hip Flexion/Marching: AROM;Both;10 reps;Seated Other Exercises Other Exercises: AROM to neck, x5 to L and x5 to R    General Comments General comments (skin integrity, edema, etc.): VSS on trach collar      Pertinent Vitals/Pain Pain Assessment: No/denies pain Faces Pain Scale: Hurts a little bit Pain Location: generalized Pain Descriptors / Indicators: Discomfort;Grimacing Pain Intervention(s): Limited activity within patient's tolerance;Monitored during session;Repositioned           PT Goals (current goals can now be found in the care plan section) Acute Rehab PT Goals Patient Stated Goal: none stated PT Goal Formulation: With patient Time For Goal Achievement: 04/11/20 Potential to Achieve Goals: Fair Progress towards PT goals: Progressing toward goals    Frequency    Min 2X/week      PT Plan Current plan remains appropriate       AM-PAC PT "6  Clicks" Mobility   Outcome Measure  Help needed turning from your back to your side while in a flat bed without using bedrails?: A Lot Help needed moving from lying on your back to sitting on the side of a flat bed without using bedrails?: A Little Help needed moving to and from a bed to a chair (including a wheelchair)?: A Little Help needed standing up from a chair using your arms (e.g., wheelchair or bedside chair)?: A Little Help needed to walk in hospital room?: A Lot Help needed climbing 3-5 steps with a railing? : A Lot 6 Click Score: 15    End of Session Equipment Utilized During Treatment: Gait belt;Oxygen Activity Tolerance: Patient tolerated treatment well;Patient limited by fatigue Patient left: in bed;with call bell/phone within reach;with nursing/sitter in room;with restraints reapplied Nurse Communication: Mobility status PT Visit Diagnosis: Hemiplegia and hemiparesis;Other abnormalities of gait and mobility (R26.89);Other symptoms and signs involving the nervous system (R29.898) Hemiplegia - Right/Left: Left Hemiplegia - dominant/non-dominant: Non-dominant Hemiplegia - caused by: Other Nontraumatic intracranial hemorrhage     Time: 1230-1253 PT Time Calculation (min) (ACUTE ONLY): 23 min  Charges:  $Therapeutic Exercise: 8-22 mins $Therapeutic Activity: 8-22 mins                     Karma Ganja, PT, DPT   Acute Rehabilitation Department Pager #: [phone]   Otho Bellows 03/28/2020, 2:07 PM

## 2020-03-29 LAB — GLUCOSE, CAPILLARY
Glucose-Capillary: 102 mg/dL — ABNORMAL HIGH (ref 70–99)
Glucose-Capillary: 121 mg/dL — ABNORMAL HIGH (ref 70–99)
Glucose-Capillary: 137 mg/dL — ABNORMAL HIGH (ref 70–99)
Glucose-Capillary: 146 mg/dL — ABNORMAL HIGH (ref 70–99)
Glucose-Capillary: 158 mg/dL — ABNORMAL HIGH (ref 70–99)
Glucose-Capillary: 94 mg/dL (ref 70–99)

## 2020-03-29 NOTE — Progress Notes (Signed)
Subjective: Patient reports no acute events overnight.   Objective: Vital signs in last 24 hours: Temp:  [97.9 F (36.6 C)-98.8 F (37.1 C)] 98.8 F (37.1 C) (01/01 0526) Pulse Rate:  [75-94] 92 (01/01 0837) Resp:  [12-23] 18 (01/01 0837) BP: (114-152)/(71-95) 135/77 (01/01 0800) SpO2:  [96 %-100 %] 98 % (01/01 0800) FiO2 (%):  [21 %] 21 % (01/01 0837) Weight:  [59.9 kg] 59.9 kg (01/01 0417)  Intake/Output from previous day: 12/31 0701 - 01/01 0700 In: 2160 [NG/GT:2160] Out: 1975 [Urine:1975] Intake/Output this shift: No intake/output data recorded.  Physical Exam: Patient is awake and alert. Nods appropriately to yes/no questions. MAEW.   Lab Results: No results for input(s): WBC, HGB, HCT, PLT in the last 72 hours. BMET No results for input(s): NA, K, CL, CO2, GLUCOSE, BUN, CREATININE, CALCIUM in the last 72 hours.  Studies/Results: No results found.  Assessment/Plan: Patient is doing well. Continue supportive care. Awaiting rehab placement.    LOS: 59 days     Council Mechanic, DNP, NP-C 03/29/2020, 8:55 AM

## 2020-03-29 NOTE — NC FL2 (Signed)
[place] LEVEL OF CARE SCREENING TOOL     IDENTIFICATION  Patient Name: Stacy Moran Birthdate: [birth year] Sex: female Admission Date (Current Location): 01/30/2020  [facility] and [place] Number:  Herbalist and Address:  [facility] [place], [place] [address] [place], [place] [address]      Provider Number: [id number]  Attending Physician Name and Address:  [name], MD  Relative Name and Phone Number:       Current Level of Care: Hospital Recommended Level of Care: [place] Prior Approval Number:    Date Approved/Denied: 03/29/20 PASRR Number: [id number]  Discharge Plan: Home    Current Diagnoses: Patient Active Problem List   Diagnosis Date Noted  . Obstructive hydrocephalus ([place])   . Seizure-like activity ([place]) 03/12/2020  . PEG (percutaneous endoscopic gastrostomy) status ([place]) 03/08/2020  . Hyperglycemia 03/08/2020  . Acute respiratory failure ([place])   . Ventilator dependence ([place])   . 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 Tracheostomy status ([place]) 02/19/2020  . Abdominal distention   . Ruptured aneurysm of artery ([place])   . SAH (subarachnoid hemorrhage) ([place])   . Subarachnoid bleed ([place])   . Tachypnea   . Prediabetes   . Hypokalemia   . Leukocytosis   . Ileus, postoperative ([place])   . Pressure injury of skin 02/13/2020  . Ruptured cerebral aneurysm ([place]) 01/30/2020    Orientation RESPIRATION BLADDER Height & Weight     Time,Situation,Place  Tracheostomy Continent Weight: 132 lb 0.9 oz (59.9 kg) Height:  _0  (175.3 cm)  BEHAVIORAL SYMPTOMS/MOOD NEUROLOGICAL BOWEL NUTRITION STATUS      Continent Feeding tube  AMBULATORY STATUS COMMUNICATION OF NEEDS Skin   Limited Assist Non-Verbally Normal                       Personal Care Assistance Level of Assistance  Bathing,Feeding,Dressing Bathing Assistance: Limited  assistance Feeding assistance: Limited assistance Dressing Assistance: Limited assistance     Functional Limitations Info  Speech     Speech Info: Impaired    SPECIAL CARE FACTORS FREQUENCY  PT (By licensed PT),OT (By licensed OT),Speech therapy     PT Frequency: 5x weekly OT Frequency: 5x weekly     Speech Therapy Frequency: 1x weekly      Contractures Contractures Info: Not present    Additional Factors Info  Code Status,Allergies Code Status Info: Full Allergies Info: NKDA           Current Medications (03/29/2020):  This is the current hospital active medication list Current Facility-Administered Medications  Medication Dose Route Frequency Provider Last Rate Last Admin  . 0.9 %  sodium chloride infusion   Intravenous PRN Chesley Mires, MD      . acetaminophen (TYLENOL) tablet 650 mg  650 mg Oral Q4H PRN Mosetta Anis, MD       Or  . acetaminophen (TYLENOL) 160 MG/5ML solution 650 mg  650 mg Per Tube Q4H PRN Mosetta Anis, MD   650 mg at 03/29/20 4235   Or  . acetaminophen (TYLENOL) suppository 650 mg  650 mg Rectal Q4H PRN Mosetta Anis, MD   650 mg at 02/21/20 0020  . amLODipine (NORVASC) tablet 10 mg  10 mg Per Tube Daily Mosetta Anis, MD   10 mg at 03/28/20 2207  . bisacodyl (DULCOLAX) suppository 10 mg  10 mg Rectal Daily PRN Mosetta Anis,  MD   10 mg at 03/04/20 0858  . chlorhexidine gluconate (MEDLINE KIT) (PERIDEX) 0.12 % solution 15 mL  15 mL Mouth Rinse BID Mosetta Anis, MD   15 mL at 03/28/20 1959  . Chlorhexidine Gluconate Cloth 2 % PADS 6 each  6 each Topical Daily Mosetta Anis, MD   6 each at 03/28/20 1140  . docusate (COLACE) 50 MG/5ML liquid 100 mg  100 mg Per Tube BID PRN Ollis, Brandi L, NP      . feeding supplement (OSMOLITE 1.2 CAL) liquid 1,000 mL  1,000 mL Per Tube Continuous Mosetta Anis, MD 60 mL/hr at 03/28/20 0853 1,000 mL at 03/28/20 0853  . feeding supplement (PROSource TF) liquid 45 mL  45 mL Per Tube BID Mosetta Anis, MD   45 mL at  03/28/20 2207  . free water 200 mL  200 mL Per Tube Q6H [name], MD   200 mL at 03/29/20 0547  . guaiFENesin (ROBITUSSIN) 100 MG/5ML solution 100 mg  5 mL Per Tube Q4H PRN Mosetta Anis, MD   100 mg at 03/27/20 0617  . heparin injection 5,000 Units  5,000 Units Subcutaneous Q8H Mosetta Anis, MD   5,000 Units at 03/29/20 0547  . hydrALAZINE (APRESOLINE) injection 20 mg  20 mg Intravenous Q6H PRN [name], MD   20 mg at 03/23/20 2016  . hydrALAZINE (APRESOLINE) tablet 50 mg  50 mg Per Tube Q8H Olalere, Adewale A, MD   50 mg at 03/29/20 0547  . hydrocortisone cream 1 %   Topical TID PRN Mosetta Anis, MD   Given at 03/09/20 [phone]  . insulin aspart (novoLOG) injection 0-20 Units  0-20 Units Subcutaneous Q4H Mosetta Anis, MD   3 Units at 03/29/20 0038  . labetalol (NORMODYNE) injection 10-20 mg  10-20 mg Intravenous Q2H PRN [name], MD   20 mg at 03/21/20 1747  . labetalol (NORMODYNE) tablet 300 mg  300 mg Per Tube TID Mosetta Anis, MD   300 mg at 03/28/20 2208  . levETIRAcetam (KEPPRA) 100 MG/ML solution 750 mg  750 mg Per Tube BID Bhagat, Srishti L, MD   750 mg at 03/28/20 2207  . levothyroxine (SYNTHROID) tablet 100 mcg  100 mcg Per Tube [id number] Mosetta Anis, MD   100 mcg at 03/29/20 0547  . MEDLINE mouth rinse  15 mL Mouth Rinse q12n4p [name], MD   15 mL at 03/28/20 1540  . ondansetron (ZOFRAN) 4 MG/5ML solution 4 mg  4 mg Per Tube Q6H PRN Mosetta Anis, MD   4 mg at 03/08/20 1047   Or  . ondansetron (ZOFRAN) injection 4 mg  4 mg Intravenous Q6H PRN Mosetta Anis, MD      . pantoprazole sodium (PROTONIX) 40 mg/20 mL oral suspension 40 mg  40 mg Per Tube Daily Mosetta Anis, MD   40 mg at 03/28/20 0855  . polyethylene glycol (MIRALAX / GLYCOLAX) packet 17 g  17 g Per Tube Daily PRN Ollis, Brandi L, NP      . polyvinyl alcohol (LIQUIFILM TEARS) 1.4 % ophthalmic solution 1 drop  1 drop Left Eye BID Mosetta Anis, MD   1 drop at 03/28/20 2208  . QUEtiapine  (SEROQUEL) tablet 25 mg  25 mg Per Tube BID Vallarie Mare, MD   25 mg at 03/28/20 2207  . sodium chloride flush (NS) 0.9 % injection 10-40 mL  10-40 mL Intracatheter Q12H  Mosetta Anis, MD   10 mL at 03/28/20 2208  . sodium chloride flush (NS) 0.9 % injection 10-40 mL  10-40 mL Intracatheter PRN Mosetta Anis, MD   10 mL at 02/16/20 1628     Discharge Medications: Please see discharge summary for a list of discharge medications.  Relevant Imaging Results:  Relevant Lab Results:   Additional [place], LCSW

## 2020-03-30 LAB — GLUCOSE, CAPILLARY
Glucose-Capillary: 128 mg/dL — ABNORMAL HIGH (ref 70–99)
Glucose-Capillary: 138 mg/dL — ABNORMAL HIGH (ref 70–99)
Glucose-Capillary: 147 mg/dL — ABNORMAL HIGH (ref 70–99)
Glucose-Capillary: 156 mg/dL — ABNORMAL HIGH (ref 70–99)
Glucose-Capillary: 161 mg/dL — ABNORMAL HIGH (ref 70–99)
Glucose-Capillary: 93 mg/dL (ref 70–99)

## 2020-03-30 NOTE — Progress Notes (Signed)
No available safety sitter this am. Pt is calm, resting, and following commands. No restraints currently in use, aside from safety mitts. Will monitor pt for agitation.  Robina Ade, RN

## 2020-03-30 NOTE — TOC Progression Note (Signed)
Transition of Care [facility]) - Progression Note    Patient Details  Name: Summerlynn Glauser MRN: [id number] Date of Birth: 11/11/1955  Transition [facility]) CM/SW Contact  Eduard Roux, [place] Phone Number: 03/30/2020, 12:48 PM  Clinical Narrative:     Reviewed FC notes -  patient currently difficult to place-barrier, no insurance   TOC will continue to follow and assist with discharge planning   Antony Blackbird, MSW, LCSW Clinical Social Worker   Expected Discharge Plan: Skilled Nursing Facility Barriers to Discharge: Inadequate or no insurance,Continued Medical Work up,SNF Pending Medicaid,SNF Pending bed offer  Expected Discharge Plan and Services Expected Discharge Plan: Skilled Nursing Facility In-house Referral: Clinical Social Work     Living arrangements for the past 2 months:  (In hospital the last 52 days)                                       Social Determinants of Health (SDOH) Interventions    Readmission Risk Interventions No flowsheet data found.

## 2020-03-30 NOTE — Progress Notes (Signed)
Patient appears stable.  She is awake and interactive answering yes/no questions and mouthing words.  She follows commands.  Awaiting rehab

## 2020-03-30 NOTE — Progress Notes (Signed)
Pt increasingly restless throughout the day - at this time is not following commands and has attempted to climb out of the bed multiple times. Pt seen with legs over side rails. All 4 siderails up and bed alarm has been on. Verbal orders from Verlin Dike, NP, to use restraints for pt as there is no available sitter and pt has other barriers to placement.  Robina Ade, RN

## 2020-03-31 LAB — GLUCOSE, CAPILLARY
Glucose-Capillary: 115 mg/dL — ABNORMAL HIGH (ref 70–99)
Glucose-Capillary: 155 mg/dL — ABNORMAL HIGH (ref 70–99)
Glucose-Capillary: 195 mg/dL — ABNORMAL HIGH (ref 70–99)
Glucose-Capillary: 88 mg/dL (ref 70–99)
Glucose-Capillary: 92 mg/dL (ref 70–99)
Glucose-Capillary: 98 mg/dL (ref 70–99)

## 2020-03-31 NOTE — Progress Notes (Signed)
  NEUROSURGERY PROGRESS NOTE   No issues overnight.   EXAM:  BP 115/73 (BP Location: Left Arm)   Pulse 80   Temp 98.4 F (36.9 C) (Oral)   Resp 15   Ht 5\' 9"  (1.753 m)   Wt 62 kg   SpO2 100%   BMI 20.18 kg/m   Awake, alert Nods to questions MAE well Wounds c/d/i  IMPRESSION:  65 y.o. female s/p SAH and VP Shunt. Neurologically stable  PLAN: - Cont dispo planning, likely SNF. - Trach care - Cont [place]   70, MD [facility]

## 2020-03-31 NOTE — Progress Notes (Signed)
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  Required tracheostomy for airway protection.  No significant past medical history.  Significant Hospital Events   11/03 Admitted with [facility] 11/04 Craniotomy with clipping of left PICA 11/12 Total thyroidectomy, tracheostomy 11/13 start ABx for fever and tracheobronchitis 11/16 Now 11 days post clipping.  Drain stopped yesterday so left clamped.  Was interactive with PT yesterday. This morning, agitated and complaining of back pain. More somnolent following fentanyl. Neuro stable.  11/21 Pulm called back again;  trach bleeding. ENT consulted 11/23 Subglottic obstruction which is new observed on MBS>CT neck ordered to r/o infection. Also abx started. As she failed MBS IR consulted for PEG. Should be last day for nimodipine. We asked IM to assist w. 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 Redemonstration of a right cerebral convexity fluid collection which measures up to 1.6 cm, which is likely increased from prior when comparing across modalities. Leftward midline shift is also slightly increased, now measuring approximately 6 mm. Interval development of obstructive hydrocephalus  12/19 MRV head > no venous thrombosis, diminutive Rt transverse sinus, Rt SDH 2.1 cm, thin subdural collections posterior fossa  Micro Data:  11/03 SARS2/ Flu > neg 11/08 respiratory culture >> negative 11/08 blood culture >>negative 11/11 urine culture >> multiple species 11/12 MRSA PCR >> negative 11/12 sputum: staph aureus, Citrobacter and Haemophilus parainfluenza (B lactamase +)  11/22 sputum >> multiple organism, no staph or strep 12/4 Sputum>> negative and gram-positive rods 12/4 wound trach site > neg 12/8 sputum > MSSA  Antimicrobials:  Zosyn 11/13 >> 11/14 Ceftriaxone 11/14 >> 11/20 Ancef 12/13 >> 12/19  Interim history/subjective:  Afebrile. On 21% 5L/min. Agitated overnight requiring restraints  Objective   Blood pressure (!) 132/99, pulse 89, temperature 98.7 F (37.1 C), resp. rate 20, height 5\' 9"  (1.753 m), weight 62 kg, SpO2 96 %.    FiO2 (%):  [21 %] 21 %   Intake/Output Summary (Last 24 hours) at 03/31/2020 0822 Last data filed at 03/31/2020 0600 Gross per 24 hour  Intake 780 ml  Output 1050 ml  Net -270 ml   Filed Weights   03/27/20 0500 03/29/20 0417 03/31/20 0500  Weight: 59.9 kg 59.9 kg 62 kg    Physical Exam: General: Chronically ill-appearing, no acute distress HENT: [place], AT, OP clear, MMM  Neck: #6 cuffless shiley in place, c/d/i Eyes: S/p left eye enucleation Respiratory: Clear to auscultation bilaterally.  No crackles, wheezing or rales Cardiovascular: RRR,  -M/R/G, no JVD GI: BS+, soft, nontender Extremities:-Edema,-tenderness Neuro: Awakens to voice, intermittently follows commands  Resolved Hospital Problem list   Recurrent tracheobronchitis-recurrent from  Beta lactamase + H flu, Citrobacter and S.aureus are susceptible.: Patient completed 7 days treatment with ceftriaxone, Trach site bleeding   Assessment & Plan:   Compromised airway s/p tracheostomy. 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 Atypical hygromas. S/p VP shunt 12/21 -Per NSGY  Seizures -Keppra per NSGY   Agitation, insomnia. 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 -synthroid   Hyperglycemia w/o history of DM. -SSI   HTN -SBP goal <160  -continue amlodipine, labetalol, hydralazine   Anemia of critical illness. -trend CBC -transfuse for Hgb <7% or active bleeding   Goals of care. -Palliative Care following -continue aggressive care  Best Practice:  Nutrition: tube feeds DVT prophylaxis: SQ heparin SUP: protonix Mobility: PT/OT >> rec's SNF when able Code status: full code Disposition: Progressive   PCCM will intermittently follow 1x/week.  Call if help needed in between.  Labs:   CMP Latest Ref Rng & Units 03/24/2020 03/15/2020 03/14/2020  Glucose 70 - 99 mg/dL 161(W) 960(A) -  BUN 8 - 23 mg/dL 15 23 -  Creatinine 5.40 - 1.00 mg/dL 9.81(X) 9.14 -  Sodium 135 - 145 mmol/L 138 138 -  Potassium 3.5 - 5.1 mmol/L 4.2 4.1 4.4  Chloride 98 - 111 mmol/L 101 100 -  CO2 22 - 32 mmol/L 29 27 -  Calcium 8.9 - 10.3 mg/dL 9.5 9.6 -  Total Protein 6.5 - 8.1 g/dL - - -  Total Bilirubin 0.3 - 1.2 mg/dL - - -  Alkaline Phos 38 - 126 U/L - - -  AST 15 - 41 U/L - - -  ALT 0 - 44 U/L - - -    CBC Latest Ref Rng & Units 03/15/2020 03/14/2020 03/13/2020  WBC 4.0 - 10.5 K/uL 6.1 6.7 8.1  Hemoglobin 12.0 - 15.0 g/dL 10.1(L) 10.2(L)  10.7(L)  Hematocrit 36.0 - 46.0 % 33.3(L) 33.3(L) 33.7(L)  Platelets 150 - 400 K/uL 277 270 267    ABG    Component Value Date/Time   PHART 7.444 02/06/2020 0843   PCO2ART 42.8 02/06/2020 0843   PO2ART 157 (H) 02/06/2020 0843   HCO3 29.2 (H) 02/06/2020 0843   TCO2 30 02/06/2020 0843   ACIDBASEDEF 4.0 (H) 01/31/2020 0113   O2SAT 99.0 02/06/2020 0843    CBG (last 3)  Recent Labs    03/31/20 0019 03/31/20 0413 03/31/20 0711  GLUCAP 155* 92 195*    Lab Results  Component Value Date   TSH 32.655 (H) 03/24/2020    Lab Results  Component Value Date   HGBA1C 6.2 (H) 01/30/2020    Signature:   Mechele Collin, M.D. [facility] Pulmonary/Critical Care Medicine 03/31/2020 8:22 AM   Please see [email or web address] for pager details.

## 2020-03-31 NOTE — Plan of Care (Signed)
  Problem: Education: Goal: Knowledge of disease or condition will improve Outcome: Progressing Goal: Knowledge of secondary prevention will improve Outcome: Progressing Goal: Knowledge of patient specific risk factors addressed and post discharge goals established will improve Outcome: Progressing   Problem: Health Behavior/Discharge Planning: Goal: Ability to manage health-related needs will improve Outcome: Progressing   Problem: Self-Care: Goal: Ability to participate in self-care as condition permits will improve Outcome: Progressing Goal: Ability to communicate needs accurately will improve Outcome: Progressing   Problem: Nutrition: Goal: Risk of aspiration will decrease Outcome: Progressing Goal: Dietary intake will improve Outcome: Progressing   Problem: Spontaneous Subarachnoid Hemorrhage Tissue Perfusion: Goal: Complications of Spontaneous Subarachnoid Hemorrhage will be minimized Outcome: Progressing   Problem: Education: Goal: Knowledge of General Education information will improve Description: Including pain rating scale, medication(s)/side effects and non-pharmacologic comfort measures Outcome: Progressing   Problem: Health Behavior/Discharge Planning: Goal: Ability to manage health-related needs will improve Outcome: Progressing   Problem: Clinical Measurements: Goal: Ability to maintain clinical measurements within normal limits will improve Outcome: Progressing Goal: Will remain free from infection Outcome: Progressing Goal: Diagnostic test results will improve Outcome: Progressing Goal: Respiratory complications will improve Outcome: Progressing Goal: Cardiovascular complication will be avoided Outcome: Progressing   Problem: Activity: Goal: Risk for activity intolerance will decrease Outcome: Progressing   Problem: Nutrition: Goal: Adequate nutrition will be maintained Outcome: Progressing   Problem: Coping: Goal: Level of anxiety will  decrease Outcome: Progressing   Problem: Elimination: Goal: Will not experience complications related to bowel motility Outcome: Progressing Goal: Will not experience complications related to urinary retention Outcome: Progressing   Problem: Pain Managment: Goal: General experience of comfort will improve Outcome: Progressing   Problem: Safety: Goal: Ability to remain free from injury will improve Outcome: Progressing   Problem: Skin Integrity: Goal: Risk for impaired skin integrity will decrease Outcome: Progressing   Problem: Safety: Goal: Non-violent Restraint(s) Outcome: Progressing   Problem: Safety: Goal: Non-violent Restraint(s) Outcome: Progressing   

## 2020-04-01 LAB — GLUCOSE, CAPILLARY
Glucose-Capillary: 119 mg/dL — ABNORMAL HIGH (ref 70–99)
Glucose-Capillary: 123 mg/dL — ABNORMAL HIGH (ref 70–99)
Glucose-Capillary: 136 mg/dL — ABNORMAL HIGH (ref 70–99)
Glucose-Capillary: 153 mg/dL — ABNORMAL HIGH (ref 70–99)
Glucose-Capillary: 156 mg/dL — ABNORMAL HIGH (ref 70–99)
Glucose-Capillary: 73 mg/dL (ref 70–99)

## 2020-04-01 NOTE — Progress Notes (Signed)
Physical Therapy Treatment Patient Details Name: Stacy Moran MRN: [id number] DOB: August 27, 1955 Today's Date: 04/01/2020    History of Present Illness Pt is a 65 y/o female with no known PHX admitted with sudden onset of HA, BP 262/123, Imaging showing large volume SAH due to a ruptured L PICA aneurysm associated with intraventricular hydrocephalus.  11/4, pt s/p L far-lateral craniotomy for clipping of PICA aneurysm. 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 12/4 pt with desat and bradycardia and was transferred to ICU and placed on the vent. Pt off vent 12/6. 12/15 seizure prompting MRI showing worsening hydrocephalus. 12/16 transferred to ICU for bedside ventriculostomy. Pt is now s/p VP shunt placement on 12/21.    PT Comments    Pt tearful upon arrival to room, stating she is homesick and wants to see her family. Pt tolerating bed mobility and repeated transfers well, requiring min assist and truncal support once standing. Pt is impulsive at times, pulling at lines/leads and trying to pick things up off the floor unexpectedly. SNF remains appropriate d/c plan, will continue to follow acutely.     Follow Up Recommendations  SNF;Supervision/Assistance - 24 hour     Equipment Recommendations  Wheelchair (measurements PT);Wheelchair cushion (measurements PT);3in1 (PT)    Recommendations for Other Services       Precautions / Restrictions Precautions Precautions: Fall Precaution Comments: trach, bilateral UE mitts, bilateral wrist and waist restraints, sitter Restrictions Weight Bearing Restrictions: No    Mobility  Bed Mobility Overal bed mobility: Needs Assistance Bed Mobility: Supine to Sit;Sit to Supine     Supine to sit: Min assist Sit to supine: Min assist   General bed mobility comments: Min assist for trunk elevation/lowering, steadying once sitting EOB;  pt able to scoot to and from EOB with increased  time.  Transfers Overall transfer level: Needs assistance Equipment used: 1 person hand held assist (and  bedrail) Transfers: Sit to/from Stand Sit to Stand: Min assist;From elevated surface         General transfer comment: Min assist for power up and steadying, STS x3 from EOB with cues for safe hand placement. Poor safety awareness, reaching for items on the ground and losing her balance requiring PT assist to correct.  Ambulation/Gait             General Gait Details: marching in place only, standing tolerance x15 seconds   Stairs             Wheelchair Mobility    Modified Rankin (Stroke Patients Only) Modified Rankin (Stroke Patients Only) Pre-Morbid Rankin Score: No symptoms Modified Rankin: Moderately severe disability     Balance Overall balance assessment: Needs assistance Sitting-balance support: Feet supported;Single extremity supported Sitting balance-Leahy Scale: Fair Sitting balance - Comments: fair to poor sitting balance, requiring intermittent posterior truncal assist to correct Postural control: Posterior lean;Right lateral lean Standing balance support: Bilateral upper extremity supported;During functional activity Standing balance-Leahy Scale: Poor Standing balance comment: reliant on external assist                            Cognition Arousal/Alertness: Awake/alert Behavior During Therapy: Flat affect;Restless Overall Cognitive Status: Impaired/Different from baseline Area of Impairment: Attention                   Current Attention Level: Sustained   Following Commands: Follows one step commands with increased time Safety/Judgement: Decreased awareness of deficits;Decreased awareness  of safety Awareness: Intellectual Problem Solving: Slow processing;Decreased initiation;Difficulty sequencing;Requires verbal cues;Requires tactile cues General Comments: Pt tearful, mouthing to PT "I miss my family, I am homesick,  I want to go home". Pt very fidgety and reaching for peg tube/restraint lines throughout session once mobilizing.      Exercises General Exercises - Lower Extremity Hip Flexion/Marching: AROM;Both;15 reps;Standing (3 sets of 5)    General Comments General comments (skin integrity, edema, etc.): vss      Pertinent Vitals/Pain Pain Assessment: No/denies pain Faces Pain Scale: No hurt Pain Intervention(s): Limited activity within patient's tolerance;Monitored during session;Repositioned    Home Living                      Prior Function            PT Goals (current goals can now be found in the care plan section) Acute Rehab PT Goals Patient Stated Goal: none stated PT Goal Formulation: With patient Time For Goal Achievement: 04/11/20 Potential to Achieve Goals: Fair Progress towards PT goals: Progressing toward goals    Frequency    Min 2X/week      PT Plan Current plan remains appropriate    Co-evaluation              AM-PAC PT "6 Clicks" Mobility   Outcome Measure  Help needed turning from your back to your side while in a flat bed without using bedrails?: A Little Help needed moving from lying on your back to sitting on the side of a flat bed without using bedrails?: A Little Help needed moving to and from a bed to a chair (including a wheelchair)?: A Little Help needed standing up from a chair using your arms (e.g., wheelchair or bedside chair)?: A Little Help needed to walk in hospital room?: A Lot Help needed climbing 3-5 steps with a railing? : A Lot 6 Click Score: 16    End of Session Equipment Utilized During Treatment: Oxygen Activity Tolerance: Patient tolerated treatment well;Patient limited by fatigue Patient left: in bed;with call bell/phone within reach;with restraints reapplied Nurse Communication: Mobility status;Other (comment) (pt requesting RN) PT Visit Diagnosis: Hemiplegia and hemiparesis;Other abnormalities of gait and  mobility (R26.89);Other symptoms and signs involving the nervous system (R29.898) Hemiplegia - Right/Left: Left Hemiplegia - dominant/non-dominant: Non-dominant Hemiplegia - caused by: Other Nontraumatic intracranial hemorrhage     Time: 1347-1410 PT Time Calculation (min) (ACUTE ONLY): 23 min  Charges:  $Therapeutic Activity: 8-22 mins                    Marye Round, PT Acute Rehabilitation Services Pager [phone]  Office [phone]  Tyrone Apple E Christain [place] 04/01/2020, 3:56 PM

## 2020-04-01 NOTE — Progress Notes (Signed)
  NEUROSURGERY PROGRESS NOTE   No issues overnight. Working with therapy  EXAM:  BP 135/79 (BP Location: Right Arm)   Pulse 90   Temp 99.1 F (37.3 C) (Oral)   Resp 18   Ht 5\' 9"  (1.753 m)   Wt 62 kg   SpO2 97%   BMI 20.18 kg/m   Awake, alert Nods to questions MAE well Wounds c/d/i  IMPRESSION/PLAN 65 y.o. female s/p SAH and VP Shunt. Neurologically stable - dispo planning, SNF - Trach care - Cont Keppra

## 2020-04-01 NOTE — Progress Notes (Signed)
Nutrition Follow-up  DOCUMENTATION CODES:   Not applicable  INTERVENTION:  Continue tube feeds via PEG: - Osmolite 1.2 @ 60 ml/hr (1440 ml/day) - ProSource TF 45 ml BID  Tube feeding regimen provides1808kcal, 102grams of protein, and 1181ml of H2O.  Continue 200 ml free water every 6 hours per tube. Total free water: 1981 ml/day  NUTRITION DIAGNOSIS:   Inadequate oral intake related to inability to eat as evidenced by NPO status; ongoing  GOAL:   Patient will meet greater than or equal to 90% of their needs; met with TF  MONITOR:   TF tolerance,Skin,Weight trends,Labs,I & O's  REASON FOR ASSESSMENT:   Ventilator    ASSESSMENT:   64-year-old female who presented to the ED on 11/03 with headache. 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 Pt required intubation for airway protection.  11/03- admitted 11/04-SAH, L PICA aneurysm to OR for craniotomy, clipping  11/09-extubated but immediately re-intubated 11/12-s/p thyroidectomy with tracheostomy; cortrak placed(tip gastric) 11/16-EVD removed 11/17-trach exchange to facilitate PMV 11/20- Cortrakremoved by pt 11/22-trach change (pt self-decannulated) 11/26-PEG placed 12/01 - MBS with recommendation for NPO 12/03 - s/p direct laryngoscopy 12/04 - transferred to ICU, put on mechanical ventilation 12/07 - tolerating TCT; transferred to 3W 12/16 - MRI shows increased hydrocephalus; tx ICU for EVD placement. 12/20 - s/p shunt placement   Pt continues on trach collar and NPO status. Pt has been tolerating her tube feeds at goal rate. RD to continue with current tube feeding orders. Labs and medications reviewed.   Diet Order:   Diet Order            Diet NPO time specified  Diet effective midnight                 EDUCATION NEEDS:   No education needs have been identified at this time  Skin:  Skin Assessment: Reviewed RN  Assessment Skin Integrity Issues:: Stage II,Other (Comment),Incisions Stage II: throat Incisions: neck, head Other: skin tear back  Last BM:  12/30  Height:   Ht Readings from Last 1 Encounters:  03/04/20 5' 9" (1.753 m)    Weight:   Wt Readings from Last 1 Encounters:  03/31/20 62 kg    Ideal Body Weight:  65.9 kg  BMI:  Body mass index is 20.18 kg/m.  Estimated Nutritional Needs:   Kcal:  1700-1900  Protein:  95-110 grams  Fluid:  > 1.7 L/day   , MS, RD, LDN RD pager number/after hours weekend pager number on Amion.  

## 2020-04-02 LAB — GLUCOSE, CAPILLARY
Glucose-Capillary: 164 mg/dL — ABNORMAL HIGH (ref 70–99)
Glucose-Capillary: 118 mg/dL — ABNORMAL HIGH (ref 70–99)
Glucose-Capillary: 138 mg/dL — ABNORMAL HIGH (ref 70–99)
Glucose-Capillary: 100 mg/dL — ABNORMAL HIGH (ref 70–99)
Glucose-Capillary: 130 mg/dL — ABNORMAL HIGH (ref 70–99)

## 2020-04-02 NOTE — Progress Notes (Signed)
  NEUROSURGERY PROGRESS NOTE   No issues overnight.  Eager for d/c  EXAM:  BP (!) 142/81 (BP Location: Right Arm)   Pulse 87   Temp 98.3 F (36.8 C) (Oral)   Resp 15   Ht 5\' 9"  (1.753 m)   Wt 62 kg   SpO2 98%   BMI 20.18 kg/m   Awake, alert Nods to questions MAE well Wounds c/d/i   IMPRESSION/PLAN 65 y.o. female  s/pSAH and VP Shunt. Neurologically stable - dispo planning, SNF - Trach care - Cont Keppra

## 2020-04-02 NOTE — Progress Notes (Signed)
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  11/4, pt s/p L far-lateral craniotomy for clipping of PICA aneurysm. 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 12/4 pt with desat and bradycardia and was transferred to ICU and placed on the vent. Pt off vent 12/6. 12/15 seizure prompting MRI showing worsening hydrocephalus. 12/16 transferred to ICU for bedside ventriculostomy. Pt is now s/p VP shunt placement on 12/21.   OT comments  Pt currently requires max (A) with RW due to scissor gait with RW to bathroom sink level transfer. Pt with L inattention to L UE . Pt needs cues to address with hygiene. Pt with stable VSS and on RA. Pt coughing up secretions at end of session. Recommendation SNF    Follow Up Recommendations  SNF;Supervision/Assistance - 24 hour    Equipment Recommendations  Wheelchair (measurements OT);Wheelchair cushion (measurements OT);Hospital bed    Recommendations for Other Services     Precautions / Restrictions Precautions Precautions: Fall Precaution Comments: trach, bilateral UE mitts, bilateral wrist and waist restraints, sitter       Mobility Bed Mobility               General bed mobility comments: oob in chair  Transfers Overall transfer level: Needs assistance   Transfers: Sit to/from Stand Sit to Stand: Min assist         General transfer comment: pt able to power up but once standing requires max (A) with RW    Balance                                           ADL either performed or assessed with clinical judgement   ADL Overall ADL's : Needs  assistance/impaired     Grooming: Moderate assistance;Standing Grooming Details (indicate cue type and reason): max (A) at times to compelte standing task.                   Toilet Transfer Details (indicate cue type and reason): reports need to sit on [facility] and then declines         Functional mobility during ADLs: Maximal assistance;Rolling walker General ADL Comments: pt with scissoring with RW use. pt needs max (A)     Vision       Perception     Praxis      Cognition Arousal/Alertness: Awake/alert Behavior During Therapy: Flat affect Overall Cognitive Status: Impaired/Different from baseline                     Current Attention Level: Sustained   Following Commands: Follows one step commands inconsistently;Follows one step commands with increased time Safety/Judgement: Decreased awareness of safety     General Comments: pt needs cues for hand hygiene and wash L UE as well as right. pt needs cues for safety throughotu session. pt with decreased safety with RW        Exercises     Shoulder Instructions       General Comments increase risk for falls.    Pertinent Vitals/ Pain  Pain Assessment: No/denies pain  Home Living                                          Prior Functioning/Environment              Frequency  Min 2X/week        Progress Toward Goals  OT Goals(current goals can now be found in the care plan section)  Progress towards OT goals: Progressing toward goals  Acute Rehab OT Goals Patient Stated Goal: none stated OT Goal Formulation: Patient unable to participate in goal setting Time For Goal Achievement: 04/16/20 Potential to Achieve Goals: Good ADL Goals Pt Will Perform Grooming: with min assist;sitting Pt Will Perform Upper Body Bathing: with min assist;sitting Pt/caregiver will Perform Home Exercise Program: Increased strength;Increased ROM;Both right and left upper extremity;With  minimal assist;With written HEP provided Additional ADL Goal #1: Pt will follow multi-step commands with 75% accuracy and no more than min cues. Additional ADL Goal #2: Pt will perform bed mobility with min A as precursor to EOB/OOB ADL.  Plan Discharge plan remains appropriate    Co-evaluation                 AM-PAC OT "6 Clicks" Daily Activity     Outcome Measure   Help from another person eating meals?: Total Help from another person taking care of personal grooming?: A Lot Help from another person toileting, which includes using toliet, bedpan, or urinal?: A Lot Help from another person bathing (including washing, rinsing, drying)?: Total Help from another person to put on and taking off regular upper body clothing?: A Lot Help from another person to put on and taking off regular lower body clothing?: Total 6 Click Score: 9    End of Session Equipment Utilized During Treatment: Rolling walker  OT Visit Diagnosis: Unsteadiness on feet (R26.81);Muscle weakness (generalized) (M62.81);Other abnormalities of gait and mobility (R26.89) Hemiplegia - Right/Left: Right Hemiplegia - dominant/non-dominant: Dominant Hemiplegia - caused by: Nontraumatic intracerebral hemorrhage   Activity Tolerance Patient tolerated treatment well   Patient Left in chair;with call bell/phone within reach;with chair alarm set;with restraints reapplied (mittens)   Nurse Communication Mobility status;Precautions        Time: [id number] OT Time Calculation (min): 16 min  Charges: OT General Charges $OT Visit: 1 Visit OT Treatments $Self Care/Home Management : 8-22 mins   Brynn, OTR/L  Acute Rehabilitation Services Pager: [phone] Office: [phone] .    Jeri Modena 04/02/2020, 5:41 PM

## 2020-04-03 LAB — GLUCOSE, CAPILLARY
Glucose-Capillary: 107 mg/dL — ABNORMAL HIGH (ref 70–99)
Glucose-Capillary: 111 mg/dL — ABNORMAL HIGH (ref 70–99)
Glucose-Capillary: 114 mg/dL — ABNORMAL HIGH (ref 70–99)
Glucose-Capillary: 123 mg/dL — ABNORMAL HIGH (ref 70–99)
Glucose-Capillary: 142 mg/dL — ABNORMAL HIGH (ref 70–99)
Glucose-Capillary: 150 mg/dL — ABNORMAL HIGH (ref 70–99)
Glucose-Capillary: 83 mg/dL (ref 70–99)

## 2020-04-03 MED ORDER — POLYETHYLENE GLYCOL 3350 17 G PO PACK
17.0000 g | PACK | Freq: Every day | ORAL | Status: DC | PRN
  Administered 2020-04-05 – 2020-04-17 (×2): 17 g
  Filled 2020-04-03 (×2): qty 1

## 2020-04-03 NOTE — Progress Notes (Signed)
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  11/4, pt s/p L far-lateral craniotomy for clipping of PICA aneurysm. 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 12/4 pt with desat and bradycardia and was transferred to ICU and placed on the vent. Pt off vent 12/6. 12/15 seizure prompting MRI showing worsening hydrocephalus. 12/16 transferred to ICU for bedside ventriculostomy. Pt is now s/p VP shunt placement on 12/21.    PT Comments    Pt lethargic on arrival, but awoke quickly.  Emphasis on warm up resistive/aa ROM to LE's, transition to EOB, sit to stand, pre gait activity and ambulating away from and back to the bed.  Pt declined getting to the chair and returned to the bed.    Follow Up Recommendations  SNF;Supervision/Assistance - 24 hour     Equipment Recommendations  Wheelchair (measurements PT);Wheelchair cushion (measurements PT);3in1 (PT)    Recommendations for Other Services       Precautions / Restrictions Precautions Precautions: Fall Precaution Comments: trach, bilateral UE mitts, bilateral wrist and waist restraints, sitter    Mobility  Bed Mobility Overal bed mobility: Needs Assistance Bed Mobility: Supine to Sit;Sit to Supine     Supine to sit: Mod assist Sit to supine: Min assist   General bed mobility comments: cues needed to help initiate and understand direction sequencing then pt executes with delay  Transfers Overall transfer level: Needs assistance   Transfers: Sit to/from Stand Sit to Stand: +2 physical assistance;Min assist;Mod assist (x2)         General transfer comment: Cues for hand  placement, assist forward with minimal boost.  Ambulation/Gait Ambulation/Gait assistance: Mod assist;+2 physical assistance Gait Distance (Feet): 4 Feet (forward and back) Assistive device: 2 person hand held assist;1 person hand held assist Gait Pattern/deviations: Step-to pattern;Decreased step length - right;Decreased step length - left;Decreased stance time - left;Decreased stride length Gait velocity: decreased Gait velocity interpretation: <1.31 ft/sec, indicative of household ambulator General Gait Details: cues for sequencing, assist for w/shift and unweighting for stepping.  Facilitation of hip flexion to step backward.   Stairs             Wheelchair Mobility    Modified Rankin (Stroke Patients Only) Modified Rankin (Stroke Patients Only) Modified Rankin: Moderately severe disability     Balance Overall balance assessment: Needs assistance Sitting-balance support: Feet supported;Single extremity supported Sitting balance-Leahy Scale: Fair Sitting balance - Comments: not assist of UE's or external support   Standing balance support: Bilateral upper extremity supported;Single extremity supported Standing balance-Leahy Scale: Poor Standing balance comment: reliant on external assist                            Cognition Arousal/Alertness: Lethargic;Awake/alert Behavior During Therapy: Flat affect;WFL for tasks assessed/performed (improving non verbal communication) Overall Cognitive Status: Impaired/Different from baseline (NT formally)                     Current Attention Level: Sustained Memory: Decreased short-term memory Following Commands: Follows one step commands inconsistently;Follows one step commands with increased time Safety/Judgement: Decreased awareness of safety Awareness: Intellectual Problem Solving: Decreased initiation;Difficulty sequencing;Slow processing  Exercises Other Exercises Other Exercises: warm up  AROM/graded resistance bil LE in hip/knee flex/ext.    General Comments General comments (skin integrity, edema, etc.): vss on 28% FiO2      Pertinent Vitals/Pain Pain Assessment: Faces Faces Pain Scale: No hurt Pain Intervention(s): Monitored during session    Home Living                      Prior Function            PT Goals (current goals can now be found in the care plan section) Acute Rehab PT Goals Patient Stated Goal: none stated PT Goal Formulation: With patient Time For Goal Achievement: 04/11/20 Potential to Achieve Goals: Fair Progress towards PT goals: Progressing toward goals    Frequency    Min 2X/week      PT Plan Current plan remains appropriate    Co-evaluation              AM-PAC PT "6 Clicks" Mobility   Outcome Measure  Help needed turning from your back to your side while in a flat bed without using bedrails?: A Little Help needed moving from lying on your back to sitting on the side of a flat bed without using bedrails?: A Lot Help needed moving to and from a bed to a chair (including a wheelchair)?: A Lot Help needed standing up from a chair using your arms (e.g., wheelchair or bedside chair)?: A Lot Help needed to walk in hospital room?: A Lot Help needed climbing 3-5 steps with a railing? : A Lot 6 Click Score: 13    End of Session Equipment Utilized During Treatment: Oxygen Activity Tolerance: Patient tolerated treatment well;Patient limited by fatigue Patient left: in bed;with call bell/phone within reach;with bed alarm set;with restraints reapplied Nurse Communication: Mobility status;Other (comment) PT Visit Diagnosis: Hemiplegia and hemiparesis;Other abnormalities of gait and mobility (R26.89);Other symptoms and signs involving the nervous system (R29.898) Hemiplegia - Right/Left: Left Hemiplegia - dominant/non-dominant: Non-dominant Hemiplegia - caused by: Other Nontraumatic intracranial hemorrhage     Time:  1454-1530 PT Time Calculation (min) (ACUTE ONLY): 36 min  Charges:  $Gait Training: 8-22 mins $Therapeutic Activity: 8-22 mins                     04/03/2020  [name], PT Acute Rehabilitation Services [phone]  (pager) [phone]  (office)   Tessie Fass Del Wiseman 04/03/2020, 6:45 PM

## 2020-04-03 NOTE — Progress Notes (Signed)
  NEUROSURGERY PROGRESS NOTE   No issues overnight.  Frustrated and wants to go home  EXAM:  BP (!) 160/93 (BP Location: Left Arm)   Pulse 91   Temp 97.8 F (36.6 C) (Oral)   Resp 18   Ht 5\' 9"  (1.753 m)   Wt 60.2 kg   SpO2 100%   BMI 19.60 kg/m   Awake, alert Nods to questions MAE well Wounds c/d/i   IMPRESSION/PLAN 65 y.o. female s/pSAH and VP Shunt. Neurologically stable - dispo planning, SNF - Trach care - Cont Keppra

## 2020-04-03 NOTE — Progress Notes (Signed)
  Speech Language Pathology Treatment: Dysphagia;Passy Muir Speaking valve  Patient Details Name: Stacy Moran MRN: [id number] DOB: 29-Mar-1956 Today's Date: 04/03/2020 Time: 8938-1017 SLP Time Calculation (min) (ACUTE ONLY): 28 min  Assessment / Plan / Recommendation Clinical Impression  Pt much more alert and able to participate. Significant, audible secretions present with use of speaking valve to propel via trach or oral cavity. Unable to clear effectively resulting in wet quality with poor intelligibility and undecipherable utterances. RN deep suctioned which did not make significant difference with secretions or intelligibility. Vitals were stable and no evidence of air trapping. Recommend use valve with ST presently. Not appropriate for instrumental swallow intervention until she is able to mobilize secretions.    HPI HPI: 65 y/o presented with large subarachnoid hemorrhage due to ruptured PICA with resultant hydrocephalus. Admitted on 11/3, Crani on 11/4, vented until 11/12 when pt was trached. Also found to have multinodular goiter complicating respiratory failure so total thyroidectomy also completed on 11/12. Pt had been unable to functionaly utilize PMV due to copious secretions with inability to mobilize and severity of "pre stenosis" limiteds airflow severely. She became increasingly lethargic and unable to participate in therapy 12/9-12/17- and found to have seizure activity, acute right infarcts and obstructive hydrocephalus and shunt placed 12/20 (unable to obtain OR slot prior). ST orders were discontinued and now re-ordered post op.      SLP Plan  Continue with current plan of care       Recommendations  Diet recommendations: NPO Medication Administration: Via alternative means      Patient may use Passy-Muir Speech Valve: with SLP only PMSV Supervision: Full         Oral Care Recommendations: Oral care QID Follow up Recommendations: Skilled Nursing facility;LTACH SLP  Visit Diagnosis: Aphonia (R49.1);Dysphagia, unspecified (R13.10) Plan: Continue with current plan of care                      Royce Macadamia 04/03/2020, 10:03 AM  Breck Coons Lonell Face.Ed Nurse, children's [phone] Office [phone]

## 2020-04-04 LAB — GLUCOSE, CAPILLARY
Glucose-Capillary: 156 mg/dL — ABNORMAL HIGH (ref 70–99)
Glucose-Capillary: 123 mg/dL — ABNORMAL HIGH (ref 70–99)
Glucose-Capillary: 132 mg/dL — ABNORMAL HIGH (ref 70–99)
Glucose-Capillary: 123 mg/dL — ABNORMAL HIGH (ref 70–99)
Glucose-Capillary: 128 mg/dL — ABNORMAL HIGH (ref 70–99)

## 2020-04-04 NOTE — Plan of Care (Signed)
  Problem: Education: Goal: Knowledge of disease or condition will improve Outcome: Progressing Goal: Knowledge of secondary prevention will improve Outcome: Progressing Goal: Knowledge of patient specific risk factors addressed and post discharge goals established will improve Outcome: Progressing   Problem: Health Behavior/Discharge Planning: Goal: Ability to manage health-related needs will improve Outcome: Progressing   

## 2020-04-04 NOTE — Progress Notes (Signed)
  NEUROSURGERY PROGRESS NOTE   No issues overnight.   EXAM:  BP 117/72   Pulse 77   Temp 97.7 F (36.5 C) (Axillary)   Resp 16   Ht 5\' 9"  (1.753 m)   Wt 59.7 kg   SpO2 100%   BMI 19.44 kg/m   Awake, alert Nods to questions MAE well Wounds c/d/i  IMPRESSION/PLAN 65 y.o. female s/pSAH and VP Shunt. Neurologically stable - dispo planning, SNF - Cont Keppra

## 2020-04-04 NOTE — Progress Notes (Signed)
Palliative Medicine RN Note: Discussed pt in team rounds. Note that pt has been working with therapy and that TOC is working on disposition. At this time, there is no further role for PMT, so we will sign off. If new needs arise, please re-consult [place] and call our office at [phone].  Stacy Moran Stacy Mccollom, RN, BSN, [facility] Palliative Medicine Team 04/04/2020 1:36 PM Office [phone]

## 2020-04-05 LAB — GLUCOSE, CAPILLARY
Glucose-Capillary: 105 mg/dL — ABNORMAL HIGH (ref 70–99)
Glucose-Capillary: 113 mg/dL — ABNORMAL HIGH (ref 70–99)
Glucose-Capillary: 114 mg/dL — ABNORMAL HIGH (ref 70–99)
Glucose-Capillary: 116 mg/dL — ABNORMAL HIGH (ref 70–99)
Glucose-Capillary: 147 mg/dL — ABNORMAL HIGH (ref 70–99)
Glucose-Capillary: 150 mg/dL — ABNORMAL HIGH (ref 70–99)

## 2020-04-05 NOTE — TOC Progression Note (Signed)
Transition of Care [facility]) - Progression Note    Patient Details  Name: Lakyia Behe MRN: [id number] Date of Birth: 1956/03/08  Transition of Care [facility]) CM/SW [place], [place] Phone Number: 04/05/2020, 3:15 PM  Clinical Narrative:     Patient has no bed offers.  CSW will continue to follow and assist with discharge planning.  Thurmond Butts, MSW, LCSW Clinical Social Worker   Expected Discharge Plan: Skilled Nursing Facility Barriers to Discharge: Inadequate or no insurance,Continued Medical Work up,SNF Pending Medicaid,SNF Pending bed offer  Expected Discharge Plan and Services Expected Discharge Plan: [place] In-house Referral: Clinical Social Work     Living arrangements for the past 2 months:  (In hospital the last 52 days)                                       Social Determinants of Health (SDOH) Interventions    Readmission Risk Interventions No flowsheet data found.

## 2020-04-05 NOTE — Progress Notes (Signed)
Subjective: The patient is in no apparent distress.  Objective: Vital signs in last 24 hours: Temp:  [97.9 F (36.6 C)-98.7 F (37.1 C)] 98.7 F (37.1 C) (01/08 0804) Pulse Rate:  [75-88] 81 (01/08 0804) Resp:  [15-18] 17 (01/08 0804) BP: (109-156)/(64-99) 130/81 (01/08 0804) SpO2:  [94 %-100 %] 94 % (01/08 0804) FiO2 (%):  [21 %] 21 % (01/08 0804) Weight:  [57.6 kg] 57.6 kg (01/08 0500) Estimated body mass index is 18.75 kg/m as calculated from the following:   Height as of this encounter: 5\' 9"  (1.753 m).   Weight as of this encounter: 57.6 kg.   Intake/Output from previous day: 01/07 0701 - 01/08 0700 In: 1897 [NG/GT:1897] Out: -  Intake/Output this shift: Total I/O In: 63 [NG/GT:63] Out: 100 [Urine:100]  Physical exam the patient arouses to voice.  She follows commands.  Lab Results: No results for input(s): WBC, HGB, HCT, PLT in the last 72 hours. BMET No results for input(s): NA, K, CL, CO2, GLUCOSE, BUN, CREATININE, CALCIUM in the last 72 hours.  Studies/Results: No results found.  Assessment/Plan: Status post ventriculoperitoneal shunt: We are awaiting skilled nursing facility placement.  LOS: 66 days     Ophelia Charter 04/05/2020, 9:08 AM

## 2020-04-06 LAB — GLUCOSE, CAPILLARY
Glucose-Capillary: 106 mg/dL — ABNORMAL HIGH (ref 70–99)
Glucose-Capillary: 114 mg/dL — ABNORMAL HIGH (ref 70–99)
Glucose-Capillary: 125 mg/dL — ABNORMAL HIGH (ref 70–99)
Glucose-Capillary: 127 mg/dL — ABNORMAL HIGH (ref 70–99)
Glucose-Capillary: 129 mg/dL — ABNORMAL HIGH (ref 70–99)
Glucose-Capillary: 145 mg/dL — ABNORMAL HIGH (ref 70–99)
Glucose-Capillary: 145 mg/dL — ABNORMAL HIGH (ref 70–99)
Glucose-Capillary: 45 mg/dL — ABNORMAL LOW (ref 70–99)
Glucose-Capillary: 86 mg/dL (ref 70–99)

## 2020-04-06 NOTE — Plan of Care (Signed)
  Problem: Education: Goal: Knowledge of disease or condition will improve Outcome: Progressing Goal: Knowledge of secondary prevention will improve Outcome: Progressing Goal: Knowledge of patient specific risk factors addressed and post discharge goals established will improve Outcome: Progressing   Problem: Health Behavior/Discharge Planning: Goal: Ability to manage health-related needs will improve Outcome: Progressing   Problem: Self-Care: Goal: Ability to participate in self-care as condition permits will improve Outcome: Progressing Goal: Ability to communicate needs accurately will improve Outcome: Progressing   Problem: Nutrition: Goal: Risk of aspiration will decrease Outcome: Progressing Goal: Dietary intake will improve Outcome: Progressing   Problem: Spontaneous Subarachnoid Hemorrhage Tissue Perfusion: Goal: Complications of Spontaneous Subarachnoid Hemorrhage will be minimized Outcome: Progressing   Problem: Education: Goal: Knowledge of General Education information will improve Description: Including pain rating scale, medication(s)/side effects and non-pharmacologic comfort measures Outcome: Progressing   Problem: Health Behavior/Discharge Planning: Goal: Ability to manage health-related needs will improve Outcome: Progressing   Problem: Clinical Measurements: Goal: Ability to maintain clinical measurements within normal limits will improve Outcome: Progressing Goal: Will remain free from infection Outcome: Progressing Goal: Diagnostic test results will improve Outcome: Progressing Goal: Respiratory complications will improve Outcome: Progressing Goal: Cardiovascular complication will be avoided Outcome: Progressing   Problem: Activity: Goal: Risk for activity intolerance will decrease Outcome: Progressing   Problem: Nutrition: Goal: Adequate nutrition will be maintained Outcome: Progressing   Problem: Coping: Goal: Level of anxiety will  decrease Outcome: Progressing   Problem: Elimination: Goal: Will not experience complications related to bowel motility Outcome: Progressing Goal: Will not experience complications related to urinary retention Outcome: Progressing   Problem: Pain Managment: Goal: General experience of comfort will improve Outcome: Progressing   Problem: Safety: Goal: Ability to remain free from injury will improve Outcome: Progressing   Problem: Skin Integrity: Goal: Risk for impaired skin integrity will decrease Outcome: Progressing   Problem: Safety: Goal: Non-violent Restraint(s) Outcome: Progressing   Problem: Safety: Goal: Non-violent Restraint(s) Outcome: Progressing   

## 2020-04-06 NOTE — Progress Notes (Signed)
Subjective: No change from yesterday.  No apparent distress.  Objective: Vital signs in last 24 hours: Temp:  [98 F (36.7 C)-98.8 F (37.1 C)] 98.6 F (37 C) (01/09 0723) Pulse Rate:  [77-97] 80 (01/09 0855) Resp:  [14-18] 18 (01/09 0855) BP: (106-150)/(65-105) 106/77 (01/09 0855) SpO2:  [97 %-100 %] 99 % (01/09 0855) FiO2 (%):  [21 %] 21 % (01/09 0855) Weight:  [58.5 kg] 58.5 kg (01/09 0500) Estimated body mass index is 19.05 kg/m as calculated from the following:   Height as of this encounter: 5\' 9"  (1.753 m).   Weight as of this encounter: 58.5 kg.   Intake/Output from previous day: 01/08 0701 - 01/09 0700 In: 1633 [NG/GT:1263] Out: 800 [Urine:800] Intake/Output this shift: No intake/output data recorded.  Physical exam the patient follows commands.  Lab Results: No results for input(s): WBC, HGB, HCT, PLT in the last 72 hours. BMET No results for input(s): NA, K, CL, CO2, GLUCOSE, BUN, CREATININE, CALCIUM in the last 72 hours.  Studies/Results: No results found.  Assessment/Plan: Postop day #19: We are awaiting placement.  LOS: 67 days     Stacy Moran 04/06/2020, 9:33 AM

## 2020-04-06 NOTE — Progress Notes (Signed)
Blood sugar dropped to 45 as noted below.  Tube feeding had run out and was off for about an hour.  Orange juice 14mL given via tube and blood sugar rechecked as noted.  Patient remained asymptomatic and alert throughout.  Will continue to monitor.    Results for Stacy Moran, Stacy Moran (MRN [id number]) as of 04/06/2020 18:31  Ref. Range 04/06/2020 14:51 04/06/2020 15:15 04/06/2020 17:04  Glucose-Capillary Latest Ref Range: 70 - 99 mg/dL 45 (L) 86 125 (H)

## 2020-04-07 LAB — GLUCOSE, CAPILLARY
Glucose-Capillary: 106 mg/dL — ABNORMAL HIGH (ref 70–99)
Glucose-Capillary: 115 mg/dL — ABNORMAL HIGH (ref 70–99)
Glucose-Capillary: 138 mg/dL — ABNORMAL HIGH (ref 70–99)
Glucose-Capillary: 151 mg/dL — ABNORMAL HIGH (ref 70–99)
Glucose-Capillary: 151 mg/dL — ABNORMAL HIGH (ref 70–99)
Glucose-Capillary: 76 mg/dL (ref 70–99)

## 2020-04-07 MED ORDER — SCOPOLAMINE 1 MG/3DAYS TD PT72
1.0000 | MEDICATED_PATCH | TRANSDERMAL | Status: DC
  Administered 2020-04-07 – 2020-04-22 (×6): 1.5 mg via TRANSDERMAL
  Filled 2020-04-07 (×6): qty 1

## 2020-04-07 NOTE — Progress Notes (Signed)
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  Required tracheostomy for airway protection.  No significant past medical history.  Significant Hospital Events   11/03 Admitted with [facility] 11/04 Craniotomy with clipping of left PICA 11/12 Total thyroidectomy, tracheostomy 11/13 start ABx for fever and tracheobronchitis 11/16 Now 11 days post clipping.  Drain stopped yesterday so left clamped.  Was interactive with PT yesterday. This morning, agitated and complaining of back pain. More somnolent following fentanyl. Neuro stable.  11/21 Pulm called back again;  trach bleeding. ENT consulted 11/23 Subglottic obstruction which is new observed on MBS>CT neck ordered to r/o infection. Also abx started. As she failed MBS IR consulted for PEG. Should be last day for nimodipine. We asked IM to assist w. 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 Redemonstration of a right cerebral convexity fluid collection which measures up to 1.6 cm, which is likely increased from prior when comparing across modalities. Leftward midline shift is also slightly increased, now measuring approximately 6 mm. Interval development of obstructive hydrocephalus  12/19 MRV head > no venous thrombosis, diminutive Rt transverse sinus, Rt SDH 2.1 cm, thin subdural collections posterior fossa  Micro Data:  11/03 SARS2/ Flu > neg 11/08 respiratory culture >> negative 11/08 blood culture >>negative 11/11 urine culture >> multiple species 11/12 MRSA PCR >> negative 11/12 sputum: staph aureus, Citrobacter and Haemophilus parainfluenza (B lactamase +)  11/22 sputum >> multiple organism, no staph or strep 12/4 Sputum>> negative and gram-positive rods 12/4 wound trach site > neg 12/8 sputum > MSSA  Antimicrobials:  Zosyn 11/13 >> 11/14 Ceftriaxone 11/14 >> 11/20 Ancef 12/13 >> 12/19  Interim history/subjective:  No complaints  Objective   Blood pressure (Abnormal) 136/93, pulse 77, temperature (Abnormal) 97.4 F (36.3 C), temperature source Oral, resp. rate 13, height 5\' 9"  (1.753 m), weight 58.7 kg, SpO2 94 %.    FiO2 (%):  [21 %] 21 %   Intake/Output Summary (Last 24 hours) at 04/07/2020 0946 Last data filed at 04/07/2020 0600 Gross per 24 hour  Intake 3160 ml  Output 1050 ml  Net 2110 ml   Filed Weights   04/05/20 0500 04/06/20 0500 04/07/20 0500  Weight: 57.6 kg 58.5 kg 58.7 kg    Physical Exam: General 65 year old female resting in bed no acute distress HEENT cuffless size 6 Shiley  still with copious frothy white secretions Pulmonary coarse scattered rhonchi Neuro awake follows commands appropriate Cardiac regular rate and rhythm Abdomen  nontender GU voids  Resolved Hospital Problem list   Recurrent tracheobronchitis-recurrent from  Beta lactamase + H flu, Citrobacter and S.aureus are susceptible.: Patient completed 7 days treatment with ceftriaxone, Trach site bleeding   Assessment & Plan:   Compromised airway s/p tracheostomy SAH from ruptured PICA complicated by hydrocephalus s/p clipping. Atypical hygromas.S/p VP shunt 12/21 Seizures Agitation, insomnia. Goiter s/p thyroidectomy and tracheostomy 12/03. Hyperglycemia w/o history of DM. HTN Anemia of critical illness.  Pulmonary problems: Tracheostomy dependence status post thyroidectomy -Still has copious thin frothy secretions, when asked SLP visit was on 1/6.  Still not able to effectively clear secretions and not able to tolerate speaking valve -Currently not a candidate for downsizing or decannulation Plan Continue supportive care As needed suction Supplemental oxygen Tracheostomy care We will add scopolamine see if this helps cut back secretions and facilitates further SLP intervention   Best Practice:  Per primary  PCCM will intermittently follow 1x/week.  Call if help needed in between.  Erick Colace ACNP-BC [place] Pager # [phone] OR # [phone] if no answer

## 2020-04-07 NOTE — Progress Notes (Signed)
  NEUROSURGERY PROGRESS NOTE   No issues overnight.  No concerns this am  EXAM:  BP 129/72 (BP Location: Right Arm)   Pulse 74   Temp 97.7 F (36.5 C) (Axillary)   Resp 16   Ht 5\' 9"  (1.753 m)   Wt 58.7 kg   SpO2 100%   BMI 19.11 kg/m   Awake, alert Nods to questions Left CN nerve palsy MAE well Wounds c/d/i  IMPRESSION/PLAN 65 y.o. female s/p SAH and VPS. Stable. - Awaiting SNF placement

## 2020-04-07 NOTE — Plan of Care (Signed)
  Problem: Education: Goal: Knowledge of disease or condition will improve Outcome: Progressing Goal: Knowledge of secondary prevention will improve Outcome: Progressing Goal: Knowledge of patient specific risk factors addressed and post discharge goals established will improve Outcome: Progressing   Problem: Health Behavior/Discharge Planning: Goal: Ability to manage health-related needs will improve Outcome: Progressing   Problem: Self-Care: Goal: Ability to participate in self-care as condition permits will improve Outcome: Progressing Goal: Ability to communicate needs accurately will improve Outcome: Progressing   Problem: Nutrition: Goal: Risk of aspiration will decrease Outcome: Progressing Goal: Dietary intake will improve Outcome: Progressing   Problem: Spontaneous Subarachnoid Hemorrhage Tissue Perfusion: Goal: Complications of Spontaneous Subarachnoid Hemorrhage will be minimized Outcome: Progressing   Problem: Education: Goal: Knowledge of General Education information will improve Description: Including pain rating scale, medication(s)/side effects and non-pharmacologic comfort measures Outcome: Progressing   Problem: Health Behavior/Discharge Planning: Goal: Ability to manage health-related needs will improve Outcome: Progressing   Problem: Clinical Measurements: Goal: Ability to maintain clinical measurements within normal limits will improve Outcome: Progressing Goal: Will remain free from infection Outcome: Progressing Goal: Diagnostic test results will improve Outcome: Progressing Goal: Respiratory complications will improve Outcome: Progressing Goal: Cardiovascular complication will be avoided Outcome: Progressing   Problem: Activity: Goal: Risk for activity intolerance will decrease Outcome: Progressing   Problem: Nutrition: Goal: Adequate nutrition will be maintained Outcome: Progressing   Problem: Coping: Goal: Level of anxiety will  decrease Outcome: Progressing   Problem: Elimination: Goal: Will not experience complications related to bowel motility Outcome: Progressing Goal: Will not experience complications related to urinary retention Outcome: Progressing   Problem: Pain Managment: Goal: General experience of comfort will improve Outcome: Progressing   Problem: Safety: Goal: Ability to remain free from injury will improve Outcome: Progressing   Problem: Skin Integrity: Goal: Risk for impaired skin integrity will decrease Outcome: Progressing   Problem: Safety: Goal: Non-violent Restraint(s) Outcome: Progressing   Problem: Safety: Goal: Non-violent Restraint(s) Outcome: Progressing   

## 2020-04-07 NOTE — Progress Notes (Signed)
Physical Therapy Treatment Patient Details Name: Stacy Moran MRN: [id number] DOB: Dec 07, 1955 Today's Date: 04/07/2020    History of Present Illness Pt is a 65 y/o female with no known PHX admitted with sudden onset of HA, BP 262/123, Imaging showing large volume SAH due to a ruptured L PICA aneurysm associated with intraventricular hydrocephalus.  11/4, pt s/p L far-lateral craniotomy for clipping of PICA aneurysm. 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 12/4 pt with desat and bradycardia and was transferred to ICU and placed on the vent. Pt off vent 12/6. 12/15 seizure prompting MRI showing worsening hydrocephalus. 12/16 transferred to ICU for bedside ventriculostomy. Pt is now s/p VP shunt placement on 12/21.    PT Comments    Pt sleeping upon arrival to room, wakes easily when prompted by PT. Pt tolerated repeated stands at EOB, demonstrates posterior unsteadiness and LOB requiring PT truncal assist to correct. Pt soiled in urine this day, requiring PT assist for pericare and clean up, NT notified. PT continuing to recommend SNF level of care post-acutely, will continue to follow.    Follow Up Recommendations  SNF;Supervision/Assistance - 24 hour     Equipment Recommendations  Wheelchair (measurements PT);Wheelchair cushion (measurements PT);3in1 (PT)    Recommendations for Other Services       Precautions / Restrictions Precautions Precautions: Fall Precaution Comments: trach, bilateral UE mitts, bilateral wrist and waist restraints Restrictions Weight Bearing Restrictions: No    Mobility  Bed Mobility Overal bed mobility: Needs Assistance Bed Mobility: Supine to Sit;Sit to Supine     Supine to sit: Mod assist Sit to supine: Min assist   General bed mobility comments: Min-mod assist for trunk and LE management, repositioning. Increased time and effort.  Transfers Overall transfer level: Needs  assistance Equipment used: 1 person hand held assist Transfers: Sit to/from Stand Sit to Stand: Mod assist         General transfer comment: Mod assist for power up, steadying, correcting heavy posterior preference with cues. STS x3 from EOB  Ambulation/Gait Ambulation/Gait assistance: Mod assist Gait Distance (Feet): 2 Feet Assistive device: 1 person hand held assist Gait Pattern/deviations: Step-to pattern;Decreased stride length;Trunk flexed Gait velocity: decr   General Gait Details: lateral steps towards HOB x2, steadying and slow eccentric lower assist   Stairs             Wheelchair Mobility    Modified Rankin (Stroke Patients Only) Modified Rankin (Stroke Patients Only) Pre-Morbid Rankin Score: No symptoms Modified Rankin: Moderately severe disability     Balance Overall balance assessment: Needs assistance Sitting-balance support: Feet supported;Single extremity supported Sitting balance-Leahy Scale: Fair Sitting balance - Comments: initermittent posterior assist   Standing balance support: Bilateral upper extremity supported;Single extremity supported Standing balance-Leahy Scale: Poor Standing balance comment: reliant on external assist                            Cognition Arousal/Alertness: Lethargic Behavior During Therapy: Flat affect;Impulsive Overall Cognitive Status: Impaired/Different from baseline                     Current Attention Level: Sustained   Following Commands: Follows one step commands inconsistently;Follows one step commands with increased time Safety/Judgement: Decreased awareness of safety;Decreased awareness of deficits Awareness: Intellectual Problem Solving: Decreased initiation;Difficulty sequencing;Slow processing;Requires verbal cues;Requires tactile cues General Comments: Pt sleeping upon arrival to room, wakes with PT verbalization. Pt can be  impulsive during mobility, while sitting EOB pt leans  to floor to pick up restraint ties/washcloths. Cues for waiting for PT assist before mobilizing.      Exercises      General Comments General comments (skin integrity, edema, etc.): vss      Pertinent Vitals/Pain Pain Assessment: Faces Faces Pain Scale: Hurts a little bit Pain Location: generalized Pain Descriptors / Indicators: Discomfort;Guarding Pain Intervention(s): Limited activity within patient's tolerance;Monitored during session;Repositioned    Home Living                      Prior Function            PT Goals (current goals can now be found in the care plan section) Acute Rehab PT Goals Patient Stated Goal: none stated PT Goal Formulation: With patient Time For Goal Achievement: 04/11/20 Potential to Achieve Goals: Fair Progress towards PT goals: Progressing toward goals    Frequency    Min 2X/week      PT Plan Current plan remains appropriate    Co-evaluation              AM-PAC PT "6 Clicks" Mobility   Outcome Measure  Help needed turning from your back to your side while in a flat bed without using bedrails?: A Little Help needed moving from lying on your back to sitting on the side of a flat bed without using bedrails?: A Lot Help needed moving to and from a bed to a chair (including a wheelchair)?: A Lot Help needed standing up from a chair using your arms (e.g., wheelchair or bedside chair)?: A Lot Help needed to walk in hospital room?: A Lot Help needed climbing 3-5 steps with a railing? : A Lot 6 Click Score: 13    End of Session Equipment Utilized During Treatment: Oxygen Activity Tolerance: Patient tolerated treatment well;Patient limited by fatigue Patient left: in bed;with call bell/phone within reach;with bed alarm set;with restraints reapplied Nurse Communication: Mobility status;Other (comment) PT Visit Diagnosis: Hemiplegia and hemiparesis;Other abnormalities of gait and mobility (R26.89);Other symptoms and signs  involving the nervous system (R29.898) Hemiplegia - Right/Left: Left Hemiplegia - dominant/non-dominant: Non-dominant Hemiplegia - caused by: Other Nontraumatic intracranial hemorrhage     Time: 1115-1140 PT Time Calculation (min) (ACUTE ONLY): 25 min  Charges:  $Therapeutic Activity: 23-37 mins                     Stacy Moran Glaze, PT Acute Rehabilitation Services Pager [phone]  Office [phone]    [place] E Ruffin Pyo 04/07/2020, 3:07 PM

## 2020-04-07 NOTE — Progress Notes (Signed)
Per Jerrye Bushy, CCM NP, Ok to change Trach every 4 weeks instead of every 2 weeks due to large amounts of secretions.

## 2020-04-07 NOTE — Progress Notes (Signed)
  Speech Language Pathology Treatment: Nada Boozer Speaking valve  Patient Details Name: Stacy Moran MRN: [id number] DOB: 02-16-1956 Today's Date: 04/07/2020 Time: 4827-0786 SLP Time Calculation (min) (ACUTE ONLY): 18 min  Assessment / Plan / Recommendation Clinical Impression  Pt seen today with focus on PMV. She continues to have excessive, thick secretions. Tolerating PMV for longer period today with vitals stable. She is not able to produce true phonation - attempts are more whispered (false cords) and significantly wet and gurgly. Speaking valve helped her mobilize secretions slightly but needed significant posterior placement of [place] to retrieve. Pt has been started on medication to reduce secretions. There was no significant difference in output with valve on during this session. Her poor secretion management and decreased alertness have been barriers to performing instrumental assessment. She may be having greater periods of alertness throughout the day and getting closer to recommending MBS.   HPI HPI: 65 y/o presented with large subarachnoid hemorrhage due to ruptured PICA with resultant hydrocephalus. Admitted on 11/3, [place] on 11/4, vented until 11/12 when pt was trached. Also found to have multinodular goiter complicating respiratory failure so total thyroidectomy also completed on 11/12. Pt had been unable to functionaly utilize PMV due to copious secretions with inability to mobilize and severity of "pre stenosis" limiteds airflow severely. She became increasingly lethargic and unable to participate in therapy 12/9-12/17- and found to have seizure activity, acute right infarcts and obstructive hydrocephalus and shunt placed 12/20 (unable to obtain OR slot prior). ST orders were discontinued and now re-ordered post op.      SLP Plan  Continue with current plan of care       Recommendations         Patient may use Passy-Muir Speech Valve: with SLP only PMSV Supervision: Full          Oral Care Recommendations: Oral care QID Follow up Recommendations: Skilled Nursing facility;LTACH SLP Visit Diagnosis: Aphonia (R49.1) Plan: Continue with current plan of care       GO                [place] Siren 04/07/2020, 4:11 PM   Orbie Pyo Colvin Caroli.Ed Risk analyst [phone] Office [phone]

## 2020-04-08 LAB — GLUCOSE, CAPILLARY
Glucose-Capillary: 125 mg/dL — ABNORMAL HIGH (ref 70–99)
Glucose-Capillary: 116 mg/dL — ABNORMAL HIGH (ref 70–99)
Glucose-Capillary: 131 mg/dL — ABNORMAL HIGH (ref 70–99)
Glucose-Capillary: 155 mg/dL — ABNORMAL HIGH (ref 70–99)
Glucose-Capillary: 100 mg/dL — ABNORMAL HIGH (ref 70–99)
Glucose-Capillary: 124 mg/dL — ABNORMAL HIGH (ref 70–99)

## 2020-04-08 NOTE — Progress Notes (Signed)
  NEUROSURGERY PROGRESS NOTE   No issues overnight.  EXAM:  BP 140/87 (BP Location: Right Arm)   Pulse 74   Temp 98.7 F (37.1 C)   Resp 18   Ht 5\' 9"  (1.753 m)   Wt 58.9 kg   SpO2 100%   BMI 19.18 kg/m   Awake, alert Nods to questions Left CN nerve palsy MAE well Wounds c/d/i  IMPRESSION/PLAN 65 y.o. female s/p SAH and VPS. Stable. - Awaiting SNF placement

## 2020-04-08 NOTE — Progress Notes (Signed)
Nutrition Follow-up  DOCUMENTATION CODES:   Not applicable  INTERVENTION:  Continue tube feeds via PEG: - Osmolite 1.2 @ 60 ml/hr (1440 ml/day) - ProSource TF 45 ml BID  Tube feeding regimen provides1808kcal, 102grams of protein, and 1133m of H2O.  Continue200 ml free water every 6 hours per tube. Total free water: 19877mday  NUTRITION DIAGNOSIS:   Inadequate oral intake related to inability to eat as evidenced by NPO status; ongoing  GOAL:   Patient will meet greater than or equal to 90% of their needs; met with TF  MONITOR:   TF tolerance,Skin,Weight trends,Labs,I & O's  REASON FOR ASSESSMENT:   Ventilator    ASSESSMENT:   [age]ear old female who presented to the ED on 11/03 with headache. 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 Pt required intubation for airway protection.  11/03- admitted 11/04-SAH, L PICA aneurysm to OR for craniotomy, clipping  11/09-extubated but immediately re-intubated 11/12-s/p thyroidectomy with tracheostomy; cortrak placed(tip gastric) 11/16-EVD removed 11/17-trach exchange to facilitate PMV 11/20- Cortrakremoved by pt 11/22-trach change (pt self-decannulated) 11/26-PEG placed 12/01 - MBS with recommendation for NPO 12/03 - s/p direct laryngoscopy 12/04 - transferred to ICU, put on mechanical ventilation 12/07 - tolerating TCT; transferred to 3W 12/16 - MRI shows increased hydrocephalus; tx ICU for EVD placement. 12/20 - s/p shunt placement   Pt continues on trach collar and NPO status. Pt has been tolerating her tube feeds well. RD to continue with current orders. Labs and medications reviewed. Awaiting SNF placement.   Diet Order:   Diet Order            Diet NPO time specified  Diet effective midnight                 EDUCATION NEEDS:   No education needs have been identified at this time  Skin:  Skin Assessment: Reviewed RN  Assessment Skin Integrity Issues:: Stage II,Other (Comment),Incisions Stage II: throat Incisions: neck, head Other: skin tear back  Last BM:  12/30  Height:   Ht Readings from Last 1 Encounters:  03/04/20 _0  (1.753 m)    Weight:   Wt Readings from Last 1 Encounters:  04/08/20 58.9 kg    Ideal Body Weight:  65.9 kg  BMI:  Body mass index is 19.18 kg/m.  Estimated Nutritional Needs:   Kcal:  1700-1900  Protein:  95-110 grams  Fluid:  > 1.7 L/day  StCorrin ParkerMS, RD, LDN RD pager number/after hours weekend pager number on Amion.

## 2020-04-08 NOTE — Plan of Care (Signed)
  Problem: Education: Goal: Knowledge of disease or condition will improve Outcome: Progressing Goal: Knowledge of secondary prevention will improve Outcome: Progressing Goal: Knowledge of patient specific risk factors addressed and post discharge goals established will improve Outcome: Progressing   Problem: Health Behavior/Discharge Planning: Goal: Ability to manage health-related needs will improve Outcome: Progressing   Problem: Self-Care: Goal: Ability to participate in self-care as condition permits will improve Outcome: Progressing Goal: Ability to communicate needs accurately will improve Outcome: Progressing   Problem: Nutrition: Goal: Risk of aspiration will decrease Outcome: Progressing Goal: Dietary intake will improve Outcome: Progressing   Problem: Spontaneous Subarachnoid Hemorrhage Tissue Perfusion: Goal: Complications of Spontaneous Subarachnoid Hemorrhage will be minimized Outcome: Progressing   Problem: Education: Goal: Knowledge of General Education information will improve Description: Including pain rating scale, medication(s)/side effects and non-pharmacologic comfort measures Outcome: Progressing   Problem: Health Behavior/Discharge Planning: Goal: Ability to manage health-related needs will improve Outcome: Progressing   Problem: Clinical Measurements: Goal: Ability to maintain clinical measurements within normal limits will improve Outcome: Progressing Goal: Will remain free from infection Outcome: Progressing Goal: Diagnostic test results will improve Outcome: Progressing Goal: Respiratory complications will improve Outcome: Progressing Goal: Cardiovascular complication will be avoided Outcome: Progressing   Problem: Activity: Goal: Risk for activity intolerance will decrease Outcome: Progressing   Problem: Nutrition: Goal: Adequate nutrition will be maintained Outcome: Progressing   Problem: Coping: Goal: Level of anxiety will  decrease Outcome: Progressing   Problem: Elimination: Goal: Will not experience complications related to bowel motility Outcome: Progressing Goal: Will not experience complications related to urinary retention Outcome: Progressing   Problem: Pain Managment: Goal: General experience of comfort will improve Outcome: Progressing   Problem: Safety: Goal: Ability to remain free from injury will improve Outcome: Progressing   Problem: Skin Integrity: Goal: Risk for impaired skin integrity will decrease Outcome: Progressing   Problem: Safety: Goal: Non-violent Restraint(s) Outcome: Progressing   Problem: Safety: Goal: Non-violent Restraint(s) Outcome: Progressing

## 2020-04-09 ENCOUNTER — Inpatient Hospital Stay (HOSPITAL_COMMUNITY): Payer: Self-pay

## 2020-04-09 LAB — GLUCOSE, CAPILLARY
Glucose-Capillary: 123 mg/dL — ABNORMAL HIGH (ref 70–99)
Glucose-Capillary: 113 mg/dL — ABNORMAL HIGH (ref 70–99)
Glucose-Capillary: 164 mg/dL — ABNORMAL HIGH (ref 70–99)
Glucose-Capillary: 84 mg/dL (ref 70–99)
Glucose-Capillary: 130 mg/dL — ABNORMAL HIGH (ref 70–99)
Glucose-Capillary: 70 mg/dL (ref 70–99)

## 2020-04-09 NOTE — Progress Notes (Signed)
Modified Barium Swallow Progress Note  Patient Details  Name: Stacy Moran MRN: [id number] Date of Birth: [birth year]  Today's Date: 04/09/2020  Modified Barium Swallow completed.  Full report located under Chart Review in the Imaging Section.  Brief recommendations include the following:  Clinical Impression  MBS executed with PMV donned. She continues to exhibit a severe pharyngoesophageal dysphagia with minimal improvements. Compared to prior MBS', the subglottic edema appears to have decreased an appreciable amount. UES function continues to be significantly impaired in addition to tongue base retraction and pharyngeal contraction all contributing to pharyngeal stasis. Her UES opening is insufficient and allows only minimal passage of bolus and backflow of material to pyriform sinuses. Puree and nectar thick are both aspirated during and after the swallow without timely response. She did cough after a delay with barium coughed via trach. Chin tuck position utilized although no significant difference. Continue non-oral means nutrition with PEG; oral care and ST for therapeutic intervention.   Swallow Evaluation Recommendations       SLP Diet Recommendations: NPO;Alternative means - long-term       Medication Administration: Via alternative means               Oral Care Recommendations: Oral care QID        [place] Siren 04/09/2020,3:49 PM  Orbie Pyo Colvin Caroli.Ed Risk analyst [phone] Office [phone]

## 2020-04-09 NOTE — Progress Notes (Signed)
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  11/4, pt s/p L far-lateral craniotomy for clipping of PICA aneurysm. 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 12/4 pt with desat and bradycardia and was transferred to ICU and placed on the vent. Pt off vent 12/6. 12/15 seizure prompting MRI showing worsening hydrocephalus. 12/16 transferred to ICU for bedside ventriculostomy. Pt is now s/p VP shunt placement on 12/21.   OT comments  Pt making steady progress towards OT goals this session.  Pt continues to present with cognitive impairments, generalized weakness and impaired balance impacting pts ability to complete BADLs independently. Pt currently requires MOD A for sit<>stand, up to MOD A for bed mobility, MAX A for UB ADLs and total A for LB ADLs. Pt continues to be impulsive at times demonstrating deficits in other cognitive impairments such as  attention, following commands, safety/judgement, awareness and problem solving, Noted continued inattention to L side especially when instructing pt on LUE therex. Pt needing max multimodal cues to complete therex with LUE. Pt would continue to benefit from skilled occupational therapy while admitted and after d/c to address the below listed limitations in order to improve overall functional mobility and facilitate independence with BADL participation. DC plan remains appropriate, will follow acutely per POC.     Follow Up Recommendations  SNF;Supervision/Assistance - 24 hour    Equipment Recommendations  Wheelchair (measurements OT);Wheelchair cushion (measurements OT);Hospital bed     Recommendations for Other Services      Precautions / Restrictions Precautions Precautions: Fall Precaution Comments: trach, bilateral UE mitts, bilateral wrist and waist restraints Restrictions Weight Bearing Restrictions: No       Mobility Bed Mobility Overal bed mobility: Needs Assistance Bed Mobility: Supine to Sit;Sit to Supine     Supine to sit: Mod assist Sit to supine: Min assist   General bed mobility comments: MOD A to sequnce steps and to scoot hips to EOB with HOB elevated to 30*, MIN A to return to supine needing assist only to position pads  Transfers Overall transfer level: Needs assistance Equipment used: 1 person hand held assist Transfers: Sit to/from Stand Sit to Stand: Mod assist         General transfer comment: Mod assist for power up, steadying, correcting heavy posterior preference with cues.    Balance Overall balance assessment: Needs assistance Sitting-balance support: Feet supported;Single extremity supported Sitting balance-Leahy Scale: Fair Sitting balance - Comments: at least one UE supported while static sitting EOB with close minguard   Standing balance support: Bilateral upper extremity supported Standing balance-Leahy Scale: Poor Standing balance comment: reliant on external assist                           ADL either performed or assessed with clinical judgement   ADL Overall ADL's : Needs assistance/impaired                 Upper Body Dressing : Maximal assistance;Sitting Upper Body Dressing Details (indicate cue type and reason): donning new gown Lower Body Dressing: Total assistance;Sitting/lateral leans Lower Body Dressing Details (indicate cue type and reason): to don socks   Toilet Transfer  Details (indicate cue type and reason): MODA +1 for sit<>stand only         Functional mobility during ADLs: Moderate assistance (sit<>stand only) General ADL Comments: pt continues to present with cognitive  impairments, generalized weakness and impaired balance     Vision       Perception     Praxis      Cognition Arousal/Alertness: Awake/alert;Lethargic (moments of lethargy and moments of wakefulness) Behavior During Therapy: Flat affect;Impulsive (can be impulsive at times) Overall Cognitive Status: Impaired/Different from baseline Area of Impairment: Attention;Following commands;Safety/judgement;Awareness;Problem solving                   Current Attention Level: Sustained   Following Commands: Follows one step commands inconsistently;Follows one step commands with increased time Safety/Judgement: Decreased awareness of safety;Decreased awareness of deficits Awareness: Intellectual Problem Solving: Difficulty sequencing;Requires verbal cues;Requires tactile cues General Comments: pt continues to be impulsive with mobility and presents with decreased awareness into deficits as pt unaware she in leaning on bed rail or sitting on wet pad. pt requries increased time to follow one step commands and offered requried tactile cues. pt immediately went to sleep once back in bed        Exercises Other Exercises Other Exercises: seated marches x10 reps from EOB Other Exercises: LAQ x10 reps from EOB Other Exercises: functional reach with BUEs while sitting EOB, noted inattention to L side during task   Shoulder Instructions       General Comments VSS pt on 28% trach collar    Pertinent Vitals/ Pain       Pain Assessment: Faces Faces Pain Scale: No hurt  Home Living                                          Prior Functioning/Environment              Frequency  Min 2X/week        Progress Toward Goals  OT Goals(current goals can now be found in the care plan section)  Progress towards OT goals: Progressing toward goals  Acute Rehab OT Goals Patient Stated Goal: none stated OT Goal Formulation: Patient unable to participate in goal  setting Time For Goal Achievement: 04/16/20 Potential to Achieve Goals: Good  Plan Discharge plan remains appropriate;Frequency remains appropriate    Co-evaluation                 AM-PAC OT "6 Clicks" Daily Activity     Outcome Measure   Help from another person eating meals?: Total (PEG) Help from another person taking care of personal grooming?: A Lot Help from another person toileting, which includes using toliet, bedpan, or urinal?: A Lot Help from another person bathing (including washing, rinsing, drying)?: Total Help from another person to put on and taking off regular upper body clothing?: A Lot Help from another person to put on and taking off regular lower body clothing?: Total 6 Click Score: 9    End of Session Equipment Utilized During Treatment: Gait belt;Oxygen;Other (comment) (28% TC)  OT Visit Diagnosis: Unsteadiness on feet (R26.81);Muscle weakness (generalized) (M62.81);Other abnormalities of gait and mobility (R26.89) Hemiplegia - Right/Left: Right Hemiplegia - dominant/non-dominant: Dominant Hemiplegia - caused by: Nontraumatic intracerebral hemorrhage   Activity Tolerance Patient tolerated treatment well   Patient Left in bed;with call bell/phone within reach;with bed alarm set   Nurse  Communication Mobility status        Time: 1210-1228 OT Time Calculation (min): 18 min  Charges: OT General Charges $OT Visit: 1 Visit OT Treatments $Therapeutic Activity: 8-22 mins  Harley Alto., COTA/L Acute Rehabilitation Services [phone]    Precious Haws 04/09/2020, 2:44 PM

## 2020-04-09 NOTE — TOC Progression Note (Signed)
Transition of Care [facility]) - Progression Note    Patient Details  Name: Stacy Moran MRN: [id number] Date of Birth: 1955-09-20  Transition of Care [facility]) CM/SW [place], [place] Phone Number: 04/09/2020, 11:27 AM  Clinical Narrative:     CSW spoke with patient's daughter,[name]. CSW provided update, informed no bed offers-barirer to discharge. [name] confirmed after rehab the plan is for the patient to return home. She states she works during the day and is the only child but patient has sisters that is willing to come and assist with care. CSW encourage [name] to explore and discuss with family (patient's sisters) exactly how each family member can assist and for how long. CSW advised to search [facility] aide and cost for services so she can be informed and prepared if services was needed. CSW advised to monitor patient's mails for any updated regarding medicaid.   CSW contacted FC  J. Joya Gaskins to follow up on medicaid application/screening- to determine if screening was completed and who the patient's  daughter can contact.  CSW will continue to follow and assist with discharge planning.  Thurmond Butts, MSW, LCSW Clinical Social Worker    Expected Discharge Plan: Skilled Nursing Facility Barriers to Discharge: Inadequate or no insurance,Continued Medical Work up,SNF Pending Medicaid,SNF Pending bed offer  Expected Discharge Plan and Services Expected Discharge Plan: [place] In-house Referral: Clinical Social Work     Living arrangements for the past 2 months:  (In hospital the last 52 days)                                       Social Determinants of Health (SDOH) Interventions    Readmission Risk Interventions No flowsheet data found.

## 2020-04-09 NOTE — TOC Progression Note (Signed)
Transition of Care [facility]) - Progression Note    Patient Details  Name: Srinidhi Landers MRN: [id number] Date of Birth: [birth year]  Transition of Care [facility]) CM/SW [place], [place] Phone Number: 04/09/2020, 12:43 PM  Clinical Narrative:     Called patient's daughter left voice message to of Financial counselor to contact for medicaid/screening.  Thurmond Butts, MSW, LCSW Clinical Social Worker   Expected Discharge Plan: Skilled Nursing Facility Barriers to Discharge: Inadequate or no insurance,Continued Medical Work up,SNF Pending Medicaid,SNF Pending bed offer  Expected Discharge Plan and Services Expected Discharge Plan: [place] In-house Referral: Clinical Social Work     Living arrangements for the past 2 months:  (In hospital the last 52 days)                                       Social Determinants of Health (SDOH) Interventions    Readmission Risk Interventions No flowsheet data found.

## 2020-04-09 NOTE — TOC Progression Note (Signed)
Transition of Care [facility]) - Progression Note    Patient Details  Name: Stacy Moran MRN: [id number] Date of Birth: Mar 04, 1956  Transition of Care [facility]) CM/SW [place], [place] Phone Number: 04/09/2020, 8:58 AM  Clinical Narrative:     Patient has no bed offers  Thurmond Butts, MSW, LCSW Clinical Social Worker   Expected Discharge Plan: [place] Barriers to Discharge: Inadequate or no insurance,Continued Medical Work up,SNF Pending Medicaid,SNF Pending bed offer  Expected Discharge Plan and Services Expected Discharge Plan: [place] In-house Referral: Clinical Social Work     Living arrangements for the past 2 months:  (In hospital the last 52 days)                                       Social Determinants of Health (SDOH) Interventions    Readmission Risk Interventions No flowsheet data found.

## 2020-04-09 NOTE — TOC Progression Note (Signed)
Transition of Care [facility]) - Progression Note    Patient Details  Name: Stacy Moran MRN: [id number] Date of Birth: 1955/05/09  Transition of Care [facility]) CM/SW [place], [place] Phone Number: 04/09/2020, 11:15 AM  Clinical Narrative:     CSW called patient's daughter,left voice message to return call.  Thurmond Butts, MSW, LCSW Clinical Social Worker   Expected Discharge Plan: Skilled Nursing Facility Barriers to Discharge: Inadequate or no insurance,Continued Medical Work up,SNF Pending Medicaid,SNF Pending bed offer  Expected Discharge Plan and Services Expected Discharge Plan: [place] In-house Referral: Clinical Social Work     Living arrangements for the past 2 months:  (In hospital the last 52 days)                                       Social Determinants of Health (SDOH) Interventions    Readmission Risk Interventions No flowsheet data found.

## 2020-04-09 NOTE — Plan of Care (Signed)
  Problem: Education: Goal: Knowledge of disease or condition will improve Outcome: Progressing Goal: Knowledge of secondary prevention will improve Outcome: Progressing Goal: Knowledge of patient specific risk factors addressed and post discharge goals established will improve Outcome: Progressing   

## 2020-04-09 NOTE — Progress Notes (Signed)
  Speech Language Pathology Treatment: Nada Boozer Speaking valve  Patient Details Name: Stacy Moran MRN: [id number] DOB: [birth year] Today's Date: 04/09/2020 Time: 4782-9562 SLP Time Calculation (min) (ACUTE ONLY): 12 min  Assessment / Plan / Recommendation Clinical Impression  Pt with audible pharyngeal/laryngeal congestion however seems less than last session. Evidence of thick secretions in trach mask and RN report. Her respiratory support to execute deep inhalation is good but phonation not achieved when cued; whispered attempts > she has "pre stenosis" limiting airflow for speech although her tolerance and work of breathing, from subjective measure, is good- no air trapping.  Pt has not been appropriate for instrumental until now. Have MBS scheduled today at 1300.   HPI HPI: 65 y/o presented with large subarachnoid hemorrhage due to ruptured PICA with resultant hydrocephalus. Admitted on 11/3, [place] on 11/4, vented until 11/12 when pt was trached. Also found to have multinodular goiter complicating respiratory failure so total thyroidectomy also completed on 11/12. Pt had been unable to functionaly utilize PMV due to copious secretions with inability to mobilize and severity of "pre stenosis" limiteds airflow severely. She became increasingly lethargic and unable to participate in therapy 12/9-12/17- and found to have seizure activity, acute right infarcts and obstructive hydrocephalus and shunt placed 12/20 (unable to obtain OR slot prior). ST orders were discontinued and now re-ordered post op.      SLP Plan  MBS       Recommendations  Diet recommendations: NPO Medication Administration: Via alternative means      Patient may use Passy-Muir Speech Valve: During all therapies with supervision PMSV Supervision: Full         Oral Care Recommendations: Oral care QID Follow up Recommendations: Skilled Nursing facility;LTACH SLP Visit Diagnosis: Aphonia (R49.1) Plan: MBS        GO                Houston Siren 04/09/2020, 10:52 AM

## 2020-04-09 NOTE — Progress Notes (Signed)
NAME:  Stacy Moran, MRN:  [id number], DOB:  1955-04-06, LOS: 71 ADMISSION DATE:  01/30/2020, CONSULTATION DATE:  01/30/2020 REFERRING MD:  Ralene Ok, CHIEF COMPLAINT:  Headache   Brief History   65 yo female with large SAH from ruptured PICA complicated by hydrocephalus.  Required tracheostomy for airway protection.  No significant past medical history.  Significant Hospital Events   11/03 Admitted with [facility] 11/04 Craniotomy with clipping of left PICA 11/12 Total thyroidectomy, tracheostomy 11/13 start ABx for fever and tracheobronchitis 11/16 Now 11 days post clipping.  Drain stopped yesterday so left clamped.  Was interactive with PT yesterday. This morning, agitated and complaining of back pain. More somnolent following fentanyl. Neuro stable.  11/21 Pulm called back again;  trach bleeding. ENT consulted 11/23 Subglottic obstruction which is new observed on MBS>CT neck ordered to r/o infection. Also abx started. As she failed MBS IR consulted for PEG. Should be last day for nimodipine. We asked IM to assist w. 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 Redemonstration of a right cerebral convexity fluid collection which measures up to 1.6 cm, which is likely increased from prior when comparing across modalities. Leftward midline shift is also slightly increased, now measuring approximately 6 mm. Interval development of obstructive hydrocephalus  12/19 MRV head > no venous thrombosis, diminutive Rt transverse sinus, Rt SDH 2.1 cm, thin subdural collections posterior fossa  Micro Data:  11/03 SARS2/ Flu > neg 11/08 respiratory culture >> negative 11/08 blood culture >>negative 11/11 urine culture >> multiple species 11/12 MRSA PCR >> negative 11/12 sputum: staph aureus, Citrobacter and Haemophilus parainfluenza (B lactamase +)  11/22 sputum >> multiple organism, no staph or strep 12/4 Sputum>> negative and gram-positive rods 12/4 wound trach site > neg 12/8 sputum > MSSA  Antimicrobials:  Zosyn 11/13 >> 11/14 Ceftriaxone 11/14 >> 11/20 Ancef 12/13 >> 12/19  Interim history/subjective:  No complaints No overnight events  Objective   Blood pressure 136/82, pulse 82, temperature 98 F (36.7 C), temperature source Axillary, resp. rate 20, height 5\' 9"  (1.753 m), weight 62.4 kg, SpO2 96 %.    FiO2 (%):  [21 %] 21 %   Intake/Output Summary (Last 24 hours) at 04/09/2020 0840 Last data filed at 04/09/2020 0007 Gross per 24 hour  Intake 802 ml  Output 575 ml  Net 227 ml   Filed Weights   04/07/20 0500 04/08/20 0420 04/09/20 0434  Weight: 58.7 kg 58.9 kg 62.4 kg    Physical Exam: In no acute distress Trach tube in place, Coarse breath sounds Awake, follows  commands Cardiac regular rate and rhythm Abdomen nontender GU voids  Resolved Hospital Problem list   Recurrent tracheobronchitis-recurrent from  Beta lactamase + H flu,  Citrobacter and S.aureus are susceptible.: Patient completed 7 days treatment with ceftriaxone, Trach site bleeding   Assessment & Plan:   Status post tracheostomy for airway compromise Subarachnoid hemorrhage from ruptured PICA complicated by hydrocephalus s/p coiling. Atypical hygromas.  S/p VP shunt 12/21 Seizures Agitation, insomnia Status post thyroidectomy and tracheostomy 12/03 Hypertension Anemia  Tracheostomy dependence -Still with copious secretions -Not a candidate for downsizing or decannulation as she still has significant secretions  Continue supportive measures Suction as needed Supplemental oxygen as needed Tracheostomy care  Call as needed We will continue to follow once a week for trach needs  Sherrilyn Rist, MD [place] [facility] Pager: [phone]

## 2020-04-09 NOTE — Progress Notes (Signed)
  NEUROSURGERY PROGRESS NOTE   No issues overnight.  Frustrated about still being in the hospital  EXAM:  BP 136/82   Pulse 82   Temp 98 F (36.7 C) (Axillary)   Resp 20   Ht 5\' 9"  (1.753 m)   Wt 62.4 kg   SpO2 96%   BMI 20.32 kg/m   Awake, alert Nods to questions Left CN nerve palsy MAE well Wounds c/d/i  IMPRESSION/PLAN 65 y.o. females/p SAH and VPS. Stable. - Awaiting SNF placement

## 2020-04-10 LAB — GLUCOSE, CAPILLARY
Glucose-Capillary: 112 mg/dL — ABNORMAL HIGH (ref 70–99)
Glucose-Capillary: 112 mg/dL — ABNORMAL HIGH (ref 70–99)
Glucose-Capillary: 115 mg/dL — ABNORMAL HIGH (ref 70–99)
Glucose-Capillary: 131 mg/dL — ABNORMAL HIGH (ref 70–99)
Glucose-Capillary: 139 mg/dL — ABNORMAL HIGH (ref 70–99)
Glucose-Capillary: 162 mg/dL — ABNORMAL HIGH (ref 70–99)

## 2020-04-10 NOTE — Progress Notes (Signed)
  Speech Language Pathology Treatment: Nada Boozer Speaking valve  Patient Details Name: Stacy Moran MRN: [id number] DOB: [birth year] Today's Date: 04/10/2020 Time: 3536-1443 SLP Time Calculation (min) (ACUTE ONLY): 12 min  Assessment / Plan / Recommendation Clinical Impression  PMV donned while pt up in chair with audible pharyngeal/laryngeal secretions. Inhalations are adequate however with level of subglottic stenosis air cannot adequately reach vocal cords for phonation. Attempts are whispered and wet. Her vitals are stable, no increased work of breathing or distress. With cues pt attempted to cough and throat clear to expectorate secretions unsuccessfully. She states she cannot sense nor hear secretions.    HPI HPI: 65 y/o presented with large subarachnoid hemorrhage due to ruptured PICA with resultant hydrocephalus.  Craniotomy 11/4, vented until 11/12 when pt was trached. Found to have multinodular goiter complicating respiratory failure so total thyroidectomy also completed on 11/12. Pt had been unable to functionaly utilize PMV due to copious secretions with inability to mobilize and severity of "pre stenosis" limiteds airflow severely. Direct laryngoscopy with ENT revealed "the cords were thickened and swollen; significant subglottic swelling especially on the left side and posteriorly; there definitely seem to be movement of the right cord; left cord may be paretic but it is a little hard to tell.  appeared to be an immature stenosis. MBS 11/23 profound dysphagia with abnormal anatomy. Repeat MBS 12/1 minimal improvement- decr edema however significant subglottic edema/stenosis narrowing subglottic space. Bolus stops at UES with minimal opening with aspiration during/post swalow- continue NPO. Repeat MBS today for possible improvements.      SLP Plan  Continue with current plan of care       Recommendations         Patient may use Passy-Muir Speech Valve: During all therapies with  supervision PMSV Supervision: Full         Oral Care Recommendations: Oral care QID Follow up Recommendations: Skilled Nursing facility;LTACH SLP Visit Diagnosis: Aphonia (R49.1) Plan: Continue with current plan of care                       Houston Siren 04/10/2020, 4:36 PM  Orbie Pyo Colvin Caroli.Ed Risk analyst [phone] Office [phone]

## 2020-04-10 NOTE — Progress Notes (Signed)
Patient is setting up in the bedside chair. No distress is noted. Patient is resting comfortably at this time will continue to monitor.

## 2020-04-10 NOTE — Progress Notes (Signed)
°  NEUROSURGERY PROGRESS NOTE   No issues overnight. Patient sleeping when I entered. Did not feel as though waking the patient up was necessary as she has been consistently stable over the past several weeks and we are just waiting on SNF placement. Discussed patient with nursing. She did fine overnight. No issues. No neuro changes. She will call for any concerns today.   EXAM:  BP 126/76    Pulse 78    Temp 98.8 F (37.1 C) (Axillary)    Resp 16    Ht 5\' 9"  (1.753 m)    Wt 58.8 kg    SpO2 95%    BMI 19.14 kg/m

## 2020-04-10 NOTE — Progress Notes (Signed)
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  11/4, pt s/p L far-lateral craniotomy for clipping of PICA aneurysm. 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 12/4 pt with desat and bradycardia and was transferred to ICU and placed on the vent. Pt off vent 12/6. 12/15 seizure prompting MRI showing worsening hydrocephalus. 12/16 transferred to ICU for bedside ventriculostomy. Pt is now s/p VP shunt placement on 12/21.    PT Comments    Pt steadily progressing toward goals.  Will continue to make slower gains.  Emphasis on transitions, sitting balance working to come forward into midline, sit to stand, balance, pre gait, transfers and short distance gait in the RW.  Pt stood for longer amounts of time while RN complete peri care and assist with donning undergarment. Pt ended up session in the recliner.    Follow Up Recommendations  SNF;Supervision/Assistance - 24 hour     Equipment Recommendations  Wheelchair (measurements PT);Wheelchair cushion (measurements PT);3in1 (PT);Other (comment) (Will decide at later venue)    Recommendations for Other Services       Precautions / Restrictions Precautions Precautions: Fall Precaution Comments: trach, TC, posey    Mobility  Bed Mobility Overal bed mobility: Needs Assistance Bed Mobility: Supine to Sit   Sidelying to sit: Mod assist       General bed mobility comments: Initially lethargic and slow to assist coming up, but then assisted with R>L UE  Transfers Overall transfer level: Needs assistance   Transfers: Sit to/from Stand Sit to Stand: Mod assist (x5)          General transfer comment: cues for hand placement or direction.  Assist forward and boost  Ambulation/Gait Ambulation/Gait assistance: Mod assist Gait Distance (Feet): 10 Feet Assistive device: Rolling walker (2 wheeled) Gait Pattern/deviations: Step-to pattern;Decreased stride length;Trunk flexed Gait velocity: decr Gait velocity interpretation: <1.31 ft/sec, indicative of household ambulator General Gait Details: stability assist, help with RW, pt unsteady overall, needed help with w/shift and support while pt stepped.   Stairs             Wheelchair Mobility    Modified Rankin (Stroke Patients Only) Modified Rankin (Stroke Patients Only) Pre-Morbid Rankin Score: No symptoms Modified Rankin: Moderately severe disability     Balance Overall balance assessment: Needs assistance Sitting-balance support: Feet supported;Single extremity supported Sitting balance-Leahy Scale: Fair Sitting balance - Comments: pt tends toward moderate posterior lean, but can correct forward toward midline with cues and no UE assist.  Pt is not safe to sit unguarded.  Sat >10 min at EOB during line and clothing management.   Standing balance support: Bilateral upper extremity supported Standing balance-Leahy Scale: Poor Standing balance comment: reliant on external assist.  worked on pregait activity incl w/shifting, balance, stepping.  Stood >1.5 min withing 1-2 of the standing trials.                            Cognition Arousal/Alertness: Lethargic;Awake/alert Behavior During Therapy: Flat affect;Impulsive Overall Cognitive Status: Impaired/Different from baseline Area of Impairment: Attention;Following commands;Safety/judgement;Awareness;Problem solving                 Orientation  Level: Time;Situation Current Attention Level: Sustained   Following Commands: Follows one step commands with increased time Safety/Judgement: Decreased awareness of safety;Decreased  awareness of deficits Awareness: Intellectual;Emergent Problem Solving: Difficulty sequencing;Slow processing        Exercises Other Exercises Other Exercises: warm up LE ROM bil with added intention on arousing pt.    General Comments        Pertinent Vitals/Pain Pain Assessment: Faces Faces Pain Scale: No hurt Pain Location: generalized Pain Intervention(s): Monitored during session    Home Living                      Prior Function            PT Goals (current goals can now be found in the care plan section) Acute Rehab PT Goals Patient Stated Goal: none stated PT Goal Formulation: With patient Time For Goal Achievement: 04/11/20 Potential to Achieve Goals: Fair Progress towards PT goals: Progressing toward goals    Frequency    Min 2X/week      PT Plan Current plan remains appropriate    Co-evaluation              AM-PAC PT "6 Clicks" Mobility   Outcome Measure  Help needed turning from your back to your side while in a flat bed without using bedrails?: A Little Help needed moving from lying on your back to sitting on the side of a flat bed without using bedrails?: A Lot Help needed moving to and from a bed to a chair (including a wheelchair)?: A Lot Help needed standing up from a chair using your arms (e.g., wheelchair or bedside chair)?: A Lot Help needed to walk in hospital room?: A Lot Help needed climbing 3-5 steps with a railing? : A Lot 6 Click Score: 13    End of Session   Activity Tolerance: Patient tolerated treatment well;Patient limited by lethargy Patient left: in chair;with call bell/phone within reach;with chair alarm set;with restraints reapplied;Other (comment) (posey) Nurse Communication: Mobility status;Other (comment) PT Visit Diagnosis: Hemiplegia and hemiparesis;Other abnormalities of gait and mobility (R26.89);Other symptoms and signs involving the nervous system (R29.898) Hemiplegia - Right/Left:  Left Hemiplegia - dominant/non-dominant: Non-dominant Hemiplegia - caused by: Other Nontraumatic intracranial hemorrhage     Time: 1130-1207 PT Time Calculation (min) (ACUTE ONLY): 37 min  Charges:  $Gait Training: 8-22 mins $Therapeutic Activity: 8-22 mins $Neuromuscular Re-education: 8-22 mins                     04/10/2020  [name], PT Acute Rehabilitation Services [phone]  (pager) [phone]  (office)   Tessie Fass Krystle Polcyn 04/10/2020, 1:52 PM

## 2020-04-11 LAB — GLUCOSE, CAPILLARY
Glucose-Capillary: 143 mg/dL — ABNORMAL HIGH (ref 70–99)
Glucose-Capillary: 162 mg/dL — ABNORMAL HIGH (ref 70–99)
Glucose-Capillary: 175 mg/dL — ABNORMAL HIGH (ref 70–99)
Glucose-Capillary: 83 mg/dL (ref 70–99)
Glucose-Capillary: 132 mg/dL — ABNORMAL HIGH (ref 70–99)
Glucose-Capillary: 145 mg/dL — ABNORMAL HIGH (ref 70–99)
Glucose-Capillary: 102 mg/dL — ABNORMAL HIGH (ref 70–99)

## 2020-04-11 NOTE — Progress Notes (Signed)
Patient oxygen destating to 86% with trach on medical air; RN has deep suctioned and patient has coughed and airway sounds clear not gurgling or wet as usual.  RT consulted and patient switched to 5L oxygen with humidification and patient oxygenation returned to 97% at this time.  Will continue to monitor.

## 2020-04-11 NOTE — Progress Notes (Signed)
  NEUROSURGERY PROGRESS NOTE   No issues overnight.   EXAM:  BP 127/71   Pulse 77   Temp 98 F (36.7 C) (Axillary)   Resp 13   Ht 5\' 9"  (1.753 m)   Wt 59 kg   SpO2 95%   BMI 19.21 kg/m   Awake, alert Nods to questions Left CN nerve palsy MAE well Wounds c/d/i  IMPRESSION/PLAN 65 y.o. female s/p SAH and VPS. Stable. - Awaiting SNF placement

## 2020-04-12 LAB — GLUCOSE, CAPILLARY
Glucose-Capillary: 154 mg/dL — ABNORMAL HIGH (ref 70–99)
Glucose-Capillary: 129 mg/dL — ABNORMAL HIGH (ref 70–99)
Glucose-Capillary: 134 mg/dL — ABNORMAL HIGH (ref 70–99)
Glucose-Capillary: 144 mg/dL — ABNORMAL HIGH (ref 70–99)
Glucose-Capillary: 122 mg/dL — ABNORMAL HIGH (ref 70–99)
Glucose-Capillary: 105 mg/dL — ABNORMAL HIGH (ref 70–99)

## 2020-04-12 NOTE — Progress Notes (Signed)
Patient ID: Stacy Moran, female   DOB: 1955/12/03, 65 y.o.   MRN: [id number] BP 130/73 (BP Location: Right Arm)   Pulse 82   Temp 98.4 F (36.9 C) (Oral)   Resp 18   Ht 5\' 9"  (1.753 m)   Wt 60.8 kg   SpO2 94%   BMI 19.79 kg/m  Alert and oriented x4 Follows all commands Perrl, full eom Moving all extremities well Breathing well with tracheostomy

## 2020-04-13 LAB — GLUCOSE, CAPILLARY
Glucose-Capillary: 135 mg/dL — ABNORMAL HIGH (ref 70–99)
Glucose-Capillary: 152 mg/dL — ABNORMAL HIGH (ref 70–99)
Glucose-Capillary: 91 mg/dL (ref 70–99)
Glucose-Capillary: 147 mg/dL — ABNORMAL HIGH (ref 70–99)
Glucose-Capillary: 101 mg/dL — ABNORMAL HIGH (ref 70–99)
Glucose-Capillary: 114 mg/dL — ABNORMAL HIGH (ref 70–99)

## 2020-04-13 NOTE — Progress Notes (Signed)
Subjective: Patient reports NAEs o/n  Objective: Vital signs in last 24 hours: Temp:  [97.6 F (36.4 C)-99.4 F (37.4 C)] 98.2 F (36.8 C) (01/16 0800) Pulse Rate:  [78-85] 81 (01/16 0805) Resp:  [16-23] 18 (01/16 0805) BP: (111-139)/(59-96) 123/63 (01/16 0800) SpO2:  [90 %-96 %] 94 % (01/16 0805) FiO2 (%):  [21 %] 21 % (01/16 0805) Weight:  [61 kg] 61 kg (01/16 0353)  Intake/Output from previous day: 01/15 0701 - 01/16 0700 In: 1210 [P.O.:1210] Out: -  Intake/Output this shift: Total I/O In: 3961 [Other:60; NG/GT:3901] Out: -   Awake, alert Trach'd FC x 4  Lab Results: No results for input(s): WBC, HGB, HCT, PLT in the last 72 hours. BMET No results for input(s): NA, K, CL, CO2, GLUCOSE, BUN, CREATININE, CALCIUM in the last 72 hours.  Studies/Results: No results found.  Assessment/Plan: Ruptured aneurysm s/p coiling and VPS - awaiting SNF   Stacy Moran 04/13/2020, 11:05 AM

## 2020-04-14 LAB — GLUCOSE, CAPILLARY
Glucose-Capillary: 127 mg/dL — ABNORMAL HIGH (ref 70–99)
Glucose-Capillary: 138 mg/dL — ABNORMAL HIGH (ref 70–99)
Glucose-Capillary: 130 mg/dL — ABNORMAL HIGH (ref 70–99)
Glucose-Capillary: 157 mg/dL — ABNORMAL HIGH (ref 70–99)
Glucose-Capillary: 83 mg/dL (ref 70–99)
Glucose-Capillary: 120 mg/dL — ABNORMAL HIGH (ref 70–99)
Glucose-Capillary: 155 mg/dL — ABNORMAL HIGH (ref 70–99)

## 2020-04-14 NOTE — Progress Notes (Signed)
  NEUROSURGERY PROGRESS NOTE   No issues overnight.   EXAM:  BP 117/62   Pulse 82   Temp 98.4 F (36.9 C) (Axillary)   Resp 15   Ht 5\' 9"  (1.753 m)   Wt 61 kg   SpO2 92%   BMI 19.86 kg/m   Awake, alert Nods to questions MAE well Wounds c/d/i  IMPRESSION/PLAN 65 y.o. female s/p SAH and VPS. Stable. - Awaiting SNF placement

## 2020-04-14 NOTE — Progress Notes (Signed)
NAME:  Stacy Moran, MRN:  [id number], DOB:  1955-10-25, LOS: 67 ADMISSION DATE:  01/30/2020, CONSULTATION DATE:  01/30/2020 REFERRING MD:  Ralene Ok, CHIEF COMPLAINT:  Headache   Brief History   65 yo female with large SAH from ruptured PICA complicated by hydrocephalus.  Required tracheostomy for airway protection.  No significant past medical history.  Significant Hospital Events   11/03 Admitted with [facility] 11/04 Craniotomy with clipping of left PICA 11/12 Total thyroidectomy, tracheostomy 11/13 start ABx for fever and tracheobronchitis 11/16 Now 11 days post clipping.  Drain stopped yesterday so left clamped.  Was interactive with PT yesterday. This morning, agitated and complaining of back pain. More somnolent following fentanyl. Neuro stable.  11/21 Pulm called back again;  trach bleeding. ENT consulted 11/23 Subglottic obstruction which is new observed on MBS>CT neck ordered to r/o infection. Also abx started. As she failed MBS IR consulted for PEG. Should be last day for nimodipine. We asked IM to assist w. care  12/04  Put on mechanical ventilation and transferred to ICU 12/06 Tolerating PS 12/15 Seizure prompting MRI showing worsening hydrocephalus 12/16 Transferred to ICU for bedside ventriculostomy 12/21 Laparoscopic assisted insertion of VP shunt 12/27 Trach exchange to #6 cuffless shiley  Consults:  ENT (Dr.Rosen) Palliative care Neurology >> s/o 12/17  Procedures:  11/03 ETT >>11/09 11/03 EVD >> 11/09 ETT >> 11/12 11/12 Tracheostomy >> 12/16 Ventriculostomy >>  Significant Diagnostic Tests:   11/3 CT head > large volume subarachnoid related to ruptured left PICA aneurysm, intraventricular reflux with hydrocephalus, chronic small vessel disease, chronic small vessel disease  11/9 H&N U/S > 2.3x2.3x2.4 mass in the left neck along the left thyroid lobe  11/27 CT head > Resolution of subarachnoid hemorrhage, stable subdural hematoma  11/29 CT head > Unchanged size  of right subdural hematoma with minimal leftward midline shift  12/15 MRI brain > 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 Redemonstration of a right cerebral convexity fluid collection which measures up to 1.6 cm, which is likely increased from prior when comparing across modalities. Leftward midline shift is also slightly increased, now measuring approximately 6 mm. Interval development of obstructive hydrocephalus  12/19 MRV head > no venous thrombosis, diminutive Rt transverse sinus, Rt SDH 2.1 cm, thin subdural collections posterior fossa  Micro Data:  11/03 SARS2/ Flu > neg 11/08 respiratory culture >> negative 11/08 blood culture >>negative 11/11 urine culture >> multiple species 11/12 MRSA PCR >> negative 11/12 sputum: staph aureus, Citrobacter and Haemophilus parainfluenza (B lactamase +)  11/22 sputum >> multiple organism, no staph or strep 12/4 Sputum>> negative and gram-positive rods 12/4 wound trach site > neg 12/8 sputum > MSSA  Antimicrobials:  Zosyn 11/13 >> 11/14 Ceftriaxone 11/14 >> 11/20 Ancef 12/13 >> 12/19  Interim history/subjective:  Lying in bed Unable to follow commands or interact with me  No acute events overnight  No reported issues with trach   No complaints No overnight events  Objective   Blood pressure (!) 142/73, pulse 88, temperature 98.4 F (36.9 C), temperature source Axillary, resp. rate 15, height 5\' 9"  (1.753 m), weight 61 kg, SpO2 95 %.    FiO2 (%):  [21 %] 21 %   Intake/Output Summary (Last 24 hours) at 04/14/2020 0738 Last data filed at 04/14/2020 0413 Gross per 24 hour  Intake 7574 ml  Output 1150 ml  Net 6424 ml   Filed Weights   04/11/20 0413 04/12/20 0500 04/13/20 0353  Weight: 59 kg 60.8 kg 61 kg    Physical Exam: General: Middle aged female lying in bed in NAD HEENT: [place]/AT, MM pink/moist, PERRL,  Neuro: Seen with spontaneous movements, unable  to follow commands, opens eyes to verbal stimuli CV: s1s2 regular rate and rhythm, no murmur, rubs, or gallops,  PULM: 6 uncuffed shiley trach, Faint rhonchi heard bilaterally, cleared with spontaneous cough, tin white secretions seen around trach  GI: soft, bowel sounds active in all 4 quadrants, non-tender, non-distended, tolerating TF Extremities: warm/dry, no edema  Skin: no rashes or lesions  Resolved Hospital Problem list   Recurrent tracheobronchitis-recurrent from  Beta lactamase + H flu, Citrobacter and S.aureus are susceptible.: Patient completed 7 days treatment with ceftriaxone, Trach site bleeding   Assessment & Plan:   Status post tracheostomy for airway compromise Subarachnoid hemorrhage from ruptured PICA complicated by hydrocephalus s/p coiling. Atypical hygromas > S/p VP shunt 12/21 Seizures Agitation, insomnia Status post thyroidectomy and tracheostomy 12/03 Hypertension Anemia  Tracheostomy dependence -Still with copious secretions -Not a candidate for downsizing or decannulation as she continues to have significant secretions and continues with decreased cognitive abilities    P: Continue with supportive care Routine trach care  Suction as needed  Remains on trach collar at 21%    PCCM will continue to follow once per week for trach care   Johnsie Cancel, NP-C [facility] Contact / Pager information can be found on Amion  04/14/2020, 7:42 AM

## 2020-04-14 NOTE — Progress Notes (Signed)
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  11/4, pt s/p L far-lateral craniotomy for clipping of PICA aneurysm. 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 12/4 pt with desat and bradycardia and was transferred to ICU and placed on the vent. Pt off vent 12/6. 12/15 seizure prompting MRI showing worsening hydrocephalus. 12/16 transferred to ICU for bedside ventriculostomy. Pt is now s/p VP shunt placement on 12/21.    PT Comments    Pt has generally met goals and will update.  She still has trouble sustaining her attention.  Emphasis on guarding while pt transitioned, assisted to stand and progressed gait in the RW for approx 70 feet.  Follow Up Recommendations  SNF;Supervision/Assistance - 24 hour     Equipment Recommendations  Wheelchair (measurements PT);Wheelchair cushion (measurements PT);3in1 (PT);Other (comment)    Recommendations for Other Services       Precautions / Restrictions Precautions Precautions: Fall Precaution Comments: trach, TC, posey    Mobility  Bed Mobility Overal bed mobility: Needs Assistance Bed Mobility: Supine to Sit Rolling: Min guard         General bed mobility comments: pt can easily come up from supine to sitting and walk legs off side of bed and scoot to EOB given time to focus on and execute the command.  Transfers Overall transfer level: Needs assistance Equipment used: Rolling walker (2 wheeled);None Transfers: Sit to/from Stand Sit to Stand: Min assist Stand pivot transfers: Min assist;Mod assist       General transfer comment: pt needs cues for hand placement,  direction/refocuse on task.  steady assist  Ambulation/Gait Ambulation/Gait assistance: Mod assist Gait Distance (Feet): 40 Feet Assistive device: Rolling walker (2 wheeled) Gait Pattern/deviations: Step-to pattern;Step-through pattern;Decreased step length - right;Decreased step length - left;Decreased stance time - right;Decreased stride length Gait velocity: decr Gait velocity interpretation: <1.31 ft/sec, indicative of household ambulator General Gait Details: overall unsteady LE's and lower trunk.  pt in uncoordinated and mildly paretic on the right, frequently ends up far behind the walker unless cued and facilitated to stay with the RW consistently.  Pt needs some w/shift assist and general stability throughout.  Notable fatigue and loss of focus as distance increases.   Stairs             Wheelchair Mobility    Modified Rankin (Stroke Patients Only) Modified Rankin (Stroke Patients Only) Modified Rankin: Moderately severe disability     Balance Overall balance assessment: Needs assistance Sitting-balance support: Single extremity supported;Feet supported Sitting balance-Leahy Scale: Fair     Standing balance support: Bilateral upper extremity supported Standing balance-Leahy Scale: Poor Standing balance comment: reliant on external assist and /or AD                            Cognition Arousal/Alertness: Awake/alert Behavior During Therapy: WFL for tasks assessed/performed;Restless Overall Cognitive Status: Impaired/Different from baseline                     Current Attention Level: Sustained   Following Commands: Follows one step commands with increased time Safety/Judgement: Decreased awareness of safety;Decreased awareness of deficits Awareness: Intellectual;Emergent Problem  Solving: Difficulty sequencing;Slow processing        Exercises      General Comments General comments (skin integrity, edema, etc.): sats  up to 99% on 28%  TC or RA,      Pertinent Vitals/Pain Faces Pain Scale: No hurt Pain Intervention(s): Monitored during session    Home Living                      Prior Function            PT Goals (current goals can now be found in the care plan section) Acute Rehab PT Goals Patient Stated Goal: none stated PT Goal Formulation: With patient Time For Goal Achievement: 04/11/20 Potential to Achieve Goals: Fair Progress towards PT goals: Progressing toward goals    Frequency    Min 2X/week      PT Plan Current plan remains appropriate    Co-evaluation              AM-PAC PT "6 Clicks" Mobility   Outcome Measure  Help needed turning from your back to your side while in a flat bed without using bedrails?: A Little Help needed moving from lying on your back to sitting on the side of a flat bed without using bedrails?: A Little   Help needed standing up from a chair using your arms (e.g., wheelchair or bedside chair)?: A Little Help needed to walk in hospital room?: A Lot Help needed climbing 3-5 steps with a railing? : A Lot 6 Click Score: 13    End of Session Equipment Utilized During Treatment: Gait belt Activity Tolerance: Patient tolerated treatment well;Patient limited by fatigue Patient left: in chair;with call bell/phone within reach;with chair alarm set;with restraints reapplied;Other (comment) Nurse Communication: Mobility status;Other (comment) PT Visit Diagnosis: Other abnormalities of gait and mobility (R26.89);Other symptoms and signs involving the nervous system (R29.898);Difficulty in walking, not elsewhere classified (R26.2)     Time: 1651-1715 PT Time Calculation (min) (ACUTE ONLY): 24 min  Charges:  $Gait Training: 8-22 mins $Therapeutic Activity: 8-22 mins                     04/14/2020  Ken M., PT Acute Rehabilitation Services [phone]  (pager) [phone]  (office)    V  04/14/2020, 5:33 PM   

## 2020-04-15 LAB — GLUCOSE, CAPILLARY
Glucose-Capillary: 102 mg/dL — ABNORMAL HIGH (ref 70–99)
Glucose-Capillary: 105 mg/dL — ABNORMAL HIGH (ref 70–99)
Glucose-Capillary: 116 mg/dL — ABNORMAL HIGH (ref 70–99)
Glucose-Capillary: 122 mg/dL — ABNORMAL HIGH (ref 70–99)
Glucose-Capillary: 149 mg/dL — ABNORMAL HIGH (ref 70–99)
Glucose-Capillary: 161 mg/dL — ABNORMAL HIGH (ref 70–99)

## 2020-04-15 NOTE — Progress Notes (Signed)
  NEUROSURGERY PROGRESS NOTE   No issues overnight.  EXAM:  BP 124/74   Pulse 84   Temp 99.1 F (37.3 C)   Resp 16   Ht 5\' 9"  (1.753 m)   Wt 60.2 kg   SpO2 94%   BMI 19.60 kg/m   Awake, alert Nods to questions MAE well Wounds c/d/i  IMPRESSION/PLAN 65 y.o. female s/p SAH and VPS. Stable. - Awaiting SNF placement

## 2020-04-15 NOTE — Progress Notes (Signed)
Nutrition Follow-up  DOCUMENTATION CODES:   Not applicable  INTERVENTION:  Continue tube feeds via PEG: - Osmolite 1.2 @ 60 ml/hr (1440 ml/day) - ProSource TF 45 ml BID  Tube feeding regimen provides1808kcal, 102grams of protein, and 1133m of H2O.  Continue200 ml free water every 6 hours per tube. Total free water: 19863mday  NUTRITION DIAGNOSIS:   Inadequate oral intake related to inability to eat as evidenced by NPO status; ongoing  GOAL:   Patient will meet greater than or equal to 90% of their needs; met with TF  MONITOR:   TF tolerance,Skin,Weight trends,Labs,I & O's  REASON FOR ASSESSMENT:   Ventilator    ASSESSMENT:   [age]ear old female who presented to the ED on 11/03 with headache. 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 Pt required intubation for airway protection.  11/03- admitted 11/04-SAH, L PICA aneurysm to OR for craniotomy, clipping  11/09-extubated but immediately re-intubated 11/12-s/p thyroidectomy with tracheostomy; cortrak placed(tip gastric) 11/16-EVD removed 11/17-trach exchange to facilitate PMV 11/20- Cortrakremoved by pt 11/22-trach change (pt self-decannulated) 11/26-PEG placed 12/01 - MBS with recommendation for NPO 12/03 - s/p direct laryngoscopy 12/04 - transferred to ICU, put on mechanical ventilation 12/07 - tolerating TCT; transferred to 3W 12/16 - MRI shows increased hydrocephalus; tx ICU for EVD placement. 12/20 - s/p shunt placement  Pt continues on trach collar and NPO status. Pt has been tolerating her tube feeding regimen well. Will continue with current orders. Awaiting SNF placement. Labs and medications reviewed.   Diet Order:   Diet Order            Diet NPO time specified  Diet effective midnight                 EDUCATION NEEDS:   No education needs have been identified at this time  Skin:  Skin Assessment: Reviewed RN  Assessment Skin Integrity Issues:: Stage II,Other (Comment),Incisions Stage II: throat Incisions: neck, head Other: skin tear back  Last BM:  1/15  Height:   Ht Readings from Last 1 Encounters:  03/04/20 5' 9"  (1.753 m)    Weight:   Wt Readings from Last 1 Encounters:  04/15/20 60.2 kg    Ideal Body Weight:  65.9 kg  BMI:  Body mass index is 19.6 kg/m.  Estimated Nutritional Needs:   Kcal:  1700-1900  Protein:  95-110 grams  Fluid:  > 1.7 L/day    StCorrin ParkerMS, RD, LDN RD pager number/after hours weekend pager number on Amion.

## 2020-04-16 LAB — GLUCOSE, CAPILLARY
Glucose-Capillary: 105 mg/dL — ABNORMAL HIGH (ref 70–99)
Glucose-Capillary: 141 mg/dL — ABNORMAL HIGH (ref 70–99)
Glucose-Capillary: 109 mg/dL — ABNORMAL HIGH (ref 70–99)
Glucose-Capillary: 142 mg/dL — ABNORMAL HIGH (ref 70–99)
Glucose-Capillary: 124 mg/dL — ABNORMAL HIGH (ref 70–99)
Glucose-Capillary: 102 mg/dL — ABNORMAL HIGH (ref 70–99)

## 2020-04-16 LAB — CBC
HCT: 38.3 % (ref 36.0–46.0)
Hemoglobin: 11.6 g/dL — ABNORMAL LOW (ref 12.0–15.0)
MCH: 27.1 pg (ref 26.0–34.0)
MCHC: 30.3 g/dL (ref 30.0–36.0)
MCV: 89.5 fL (ref 80.0–100.0)
Platelets: 167 10*3/uL (ref 150–400)
RBC: 4.28 MIL/uL (ref 3.87–5.11)
RDW: 16.6 % — ABNORMAL HIGH (ref 11.5–15.5)
WBC: 5.4 10*3/uL (ref 4.0–10.5)
nRBC: 0 % (ref 0.0–0.2)

## 2020-04-16 LAB — BASIC METABOLIC PANEL
Anion gap: 9 (ref 5–15)
BUN: 22 mg/dL (ref 8–23)
CO2: 30 mmol/L (ref 22–32)
Calcium: 9.7 mg/dL (ref 8.9–10.3)
Chloride: 100 mmol/L (ref 98–111)
Creatinine, Ser: 0.63 mg/dL (ref 0.44–1.00)
GFR, Estimated: >60 mL/min (ref >60)
Glucose, Bld: 135 mg/dL — ABNORMAL HIGH (ref 70–99)
Potassium: 4.5 mmol/L (ref 3.5–5.1)
Sodium: 139 mmol/L (ref 135–145)

## 2020-04-16 LAB — URINALYSIS, ROUTINE W REFLEX MICROSCOPIC
Bilirubin Urine: NEGATIVE
Glucose, UA: NEGATIVE mg/dL
Hgb urine dipstick: NEGATIVE
Ketones, ur: NEGATIVE mg/dL
Nitrite: POSITIVE — AB
Protein, ur: NEGATIVE mg/dL
Specific Gravity, Urine: 1.014 (ref 1.005–1.030)
WBC, UA: >50 WBC/hpf — ABNORMAL HIGH (ref 0–5)
pH: 5 (ref 5.0–8.0)

## 2020-04-16 MED ORDER — SULFAMETHOXAZOLE-TRIMETHOPRIM 800-160 MG PO TABS
1.0000 | ORAL_TABLET | Freq: Two times a day (BID) | ORAL | Status: AC
  Administered 2020-04-16 – 2020-04-20 (×10): 1 via ORAL
  Filled 2020-04-16 (×10): qty 1

## 2020-04-16 NOTE — Progress Notes (Addendum)
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  11/4, pt s/p L far-lateral craniotomy for clipping of PICA aneurysm. 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 12/4 pt with desat and bradycardia and was transferred to ICU and placed on the vent. Pt off vent 12/6. 12/15 seizure prompting MRI showing worsening hydrocephalus. 12/16 transferred to ICU for bedside ventriculostomy. Pt is now s/p VP shunt placement on 12/21.   OT comments  Pt with gradual progress towards OT goals. RN reporting that pt now with UTI; OT and NT assisted pt OOB to [facility] during session in attempts to have pt void bladder. Pt requiring min-modA (intermittent +2) for functional transfers via HHA. Pt successfully voiding bladder while seated on BSC and requiring maxA for pericare/task completion. Pt participating in additional simple grooming ADL while seated EOB with supervision for safety/balance. Noted pt using RUE and incorporating into functional task but with reduced use of LUE. Additional focus on LUE A/AAROM and stretching end of session given noted weakness and lack of use. Continue to recommend SNF level therapies at time of discharge with acute OT goals/goal date updated today. Acute OT to follow.    Follow Up Recommendations  SNF;Supervision/Assistance - 24 hour    Equipment Recommendations  Wheelchair (measurements OT);Wheelchair cushion (measurements OT);Hospital bed          Precautions / Restrictions Precautions Precautions: Fall Precaution Comments: trach, TC, NG Restrictions Weight Bearing Restrictions: No       Mobility Bed Mobility Overal  bed mobility: Needs Assistance Bed Mobility: Supine to Sit;Sit to Supine     Supine to sit: Mod assist Sit to supine: Min assist   General bed mobility comments: assist for trunk when transitioning to upright, increased time/cues. light assist for LEs when returning to supine  Transfers Overall transfer level: Needs assistance Equipment used: Rolling walker (2 wheeled);None Transfers: Sit to/from Stand Sit to Stand: Min assist;+2 safety/equipment Stand pivot transfers: Min assist;Mod assist;+2 physical assistance;+2 safety/equipment (fluctuates)       General transfer comment: pt needs cues for hand placement, direction/refocuse on task.  steady assist    Balance Overall balance assessment: Needs assistance Sitting-balance support: Single extremity supported;Feet supported Sitting balance-Leahy Scale: Fair     Standing balance support: Bilateral upper extremity supported Standing balance-Leahy Scale: Poor Standing balance comment: reliant on external assist and /or AD                           ADL either performed or assessed with clinical judgement   ADL Overall ADL's : Needs assistance/impaired     Grooming: Supervision/safety;Sitting;Wash/dry face Grooming Details (indicate cue type and reason): seated EOB                 Toilet Transfer: +2 for physical assistance;+2 for safety/equipment;Stand-pivot;BSC;Moderate assistance Toilet Transfer Details (indicate cue type and reason): minA to BSC, modA to return to EOB (+2) Toileting- Clothing Manipulation and Hygiene: Maximal assistance;+2 for physical assistance;+2 for safety/equipment;Sit to/from stand Toileting - Clothing Manipulation Details (indicate cue type and reason): assist for standing balance while NT assisting with pericare after voiding bladder     Functional mobility during  ADLs: Minimal assistance;Moderate assistance;+2 for physical assistance;+2 for safety/equipment (fluctuates, stand  pivot)                         Cognition Arousal/Alertness: Awake/alert Behavior During Therapy: Flat affect Overall Cognitive Status: Impaired/Different from baseline                     Current Attention Level: Sustained   Following Commands: Follows one step commands with increased time Safety/Judgement: Decreased awareness of safety;Decreased awareness of deficits Awareness: Intellectual;Emergent Problem Solving: Difficulty sequencing;Slow processing General Comments: pt participatory given encouragement        Exercises Exercises: General Upper Extremity General Exercises - Upper Extremity Shoulder Flexion: AAROM;Left;5 reps;Supine Shoulder Horizontal ABduction: AAROM;Left;10 reps Shoulder Horizontal ADduction: AAROM;Left;10 reps Elbow Flexion: AAROM;Left (passive stretch) Elbow Extension: AAROM;Left (passive stretch) Digit Composite Flexion: AROM;AAROM;Left Composite Extension: AROM;AAROM;Left   Shoulder Instructions       General Comments      Pertinent Vitals/ Pain       Pain Assessment: Faces Faces Pain Scale: No hurt Pain Intervention(s): Monitored during session  Home Living                                          Prior Functioning/Environment              Frequency  Min 2X/week        Progress Toward Goals  OT Goals(current goals can now be found in the care plan section)  Progress towards OT goals: Progressing toward goals  Acute Rehab OT Goals Patient Stated Goal: none stated OT Goal Formulation: Patient unable to participate in goal setting Time For Goal Achievement: 04/30/20 Potential to Achieve Goals: Good  Plan Discharge plan remains appropriate;Frequency remains appropriate    Co-evaluation                 AM-PAC OT "6 Clicks" Daily Activity     Outcome Measure   Help from another person eating meals?: Total (NPO) Help from another person taking care of personal grooming?: A  Lot Help from another person toileting, which includes using toliet, bedpan, or urinal?: A Lot Help from another person bathing (including washing, rinsing, drying)?: A Lot Help from another person to put on and taking off regular upper body clothing?: A Lot Help from another person to put on and taking off regular lower body clothing?: Total 6 Click Score: 10    End of Session Equipment Utilized During Treatment: Oxygen  OT Visit Diagnosis: Unsteadiness on feet (R26.81);Muscle weakness (generalized) (M62.81);Other abnormalities of gait and mobility (R26.89)   Activity Tolerance Patient tolerated treatment well   Patient Left in bed;with call bell/phone within reach;with bed alarm set   Nurse Communication Mobility status        Time: 0347-4259 OT Time Calculation (min): 14 min  Charges: OT General Charges $OT Visit: 1 Visit OT Treatments $Self Care/Home Management : 8-22 mins  Lou Cal, [place] Pager [phone] Office [phone]    Raymondo Band 04/16/2020, 2:59 PM

## 2020-04-16 NOTE — Progress Notes (Signed)
  NEUROSURGERY PROGRESS NOTE   No issues overnight.  Per nursing has been becoming less interactive socially, but still neurologically stable otherwise. Worsening trach secretions, + odor.  EXAM:  BP 115/76 (BP Location: Right Arm)   Pulse 81   Temp 98.7 F (37.1 C) (Oral)   Resp 14   Ht 5\' 9"  (1.753 m)   Wt 58.4 kg   SpO2 96%   BMI 19.01 kg/m   Awake, alert Nods to questions MAE well Wounds c/d/i  IMPRESSION/PLAN 65 y.o. female s/p SAH and VPS. Less social, but still neurologically stable. Nursing obtained UA which is nitrite positive, consistent with UTI. - UTI: Start Bactrim BID x5days. Send for culture. F/U culture - Will check CBC, BMP  - Awaiting SNF placement

## 2020-04-17 LAB — GLUCOSE, CAPILLARY
Glucose-Capillary: 123 mg/dL — ABNORMAL HIGH (ref 70–99)
Glucose-Capillary: 132 mg/dL — ABNORMAL HIGH (ref 70–99)
Glucose-Capillary: 115 mg/dL — ABNORMAL HIGH (ref 70–99)
Glucose-Capillary: 148 mg/dL — ABNORMAL HIGH (ref 70–99)
Glucose-Capillary: 144 mg/dL — ABNORMAL HIGH (ref 70–99)
Glucose-Capillary: 98 mg/dL (ref 70–99)

## 2020-04-17 NOTE — Progress Notes (Signed)
  NEUROSURGERY PROGRESS NOTE   No issues overnight. No concerns this am  EXAM:  BP 119/72 (BP Location: Right Arm)   Pulse 81   Temp 98.2 F (36.8 C) (Axillary)   Resp 16   Ht 5\' 9"  (1.753 m)   Wt 58.4 kg   SpO2 98%   BMI 19.01 kg/m   Sleeping when I entered. Easily awakened Nods to questions MAE well Wounds c/d/i  IMPRESSION/PLAN 65 y.o. female s/p SAH and VPS. Less social, but still neurologically stable. Nursing obtained UA which is nitrite positive, consistent with UTI. - UTI: Bactrim 2/5. Follow culture. Labs unremarkable. - Awaiting SNF placement

## 2020-04-17 NOTE — Progress Notes (Signed)
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  11/4, pt s/p L far-lateral craniotomy for clipping of PICA aneurysm. 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 12/4 pt with desat and bradycardia and was transferred to ICU and placed on the vent. Pt off vent 12/6. 12/15 seizure prompting MRI showing worsening hydrocephalus. 12/16 transferred to ICU for bedside ventriculostomy. Pt is now s/p VP shunt placement on 12/21.    PT Comments    Pt demonstrating good mobility progression, pt now ambulatory with use of RW and mod assist for steadying. Pt with ataxic-like gait, requiring max cuing for form and safety. Pt tolerated repeated stands throughout session with VSS on RA. PT updating plan to reflect CIR, as pt is getting to the point where she could tolerate intensive therapies and would benefit from maximizing therapy services. PT to continue to follow acutely.    Follow Up Recommendations  Supervision/Assistance - 24 hour;CIR     Equipment Recommendations  Wheelchair (measurements PT);Wheelchair cushion (measurements PT);3in1 (PT);Other (comment)    Recommendations for Other Services       Precautions / Restrictions Precautions Precautions: Fall Precaution Comments: trach 28%/5L Required Braces or Orthoses: Other Brace Other Brace: abdominal binder Restrictions Weight Bearing Restrictions: No    Mobility  Bed Mobility Overal bed mobility: Needs Assistance Bed Mobility: Supine to Sit     Supine to sit: Mod assist     General bed mobility comments: mod assist for trunk elevation, LE management, scooting to EOB with use of  bed pad and PT assist.  Transfers Overall transfer level: Needs assistance Equipment used: Rolling walker (2 wheeled) Transfers: Sit to/from Stand Sit to Stand: Min assist         General transfer comment: Min assist for power up, rise, and steady. Verbal cuing for hand placement when rising, STS x3.  Ambulation/Gait Ambulation/Gait assistance: Mod assist Gait Distance (Feet): 20 Feet Assistive device: Rolling walker (2 wheeled) Gait Pattern/deviations: Step-through pattern;Decreased stride length;Trunk flexed;Ataxic Gait velocity: decr   General Gait Details: Mod assist to steady, guide pt/RW, support bodyweight with fatigue. Verbal cuing for placement within RW, upright posture   Stairs             Wheelchair Mobility    Modified Rankin (Stroke Patients Only) Modified Rankin (Stroke Patients Only) Pre-Morbid Rankin Score: No symptoms Modified Rankin: Moderately severe disability     Balance Overall balance assessment: Needs assistance Sitting-balance support: Single extremity supported;Feet supported Sitting balance-Leahy Scale: Fair     Standing balance support: Bilateral upper extremity supported Standing balance-Leahy Scale: Poor Standing balance comment: reliant on external assist                            Cognition Arousal/Alertness: Awake/alert Behavior During Therapy: Flat affect;Impulsive Overall Cognitive Status: Impaired/Different from baseline                     Current Attention Level: Sustained   Following Commands: Follows one step commands with increased time Safety/Judgement: Decreased awareness of safety;Decreased awareness of deficits Awareness: Intellectual;Emergent Problem Solving: Difficulty sequencing;Slow processing;Requires tactile cues;Requires verbal cues General Comments: sleeping upon  arrival, wakes with PT encouragement. Pt pulling at lines/leads throughout session      Exercises      General  Comments        Pertinent Vitals/Pain Pain Assessment: Faces Faces Pain Scale: Hurts little more Pain Location: generalized Pain Descriptors / Indicators: Discomfort;Guarding Pain Intervention(s): Limited activity within patient's tolerance;Monitored during session;Repositioned    Home Living                      Prior Function            PT Goals (current goals can now be found in the care plan section) Acute Rehab PT Goals Patient Stated Goal: none stated PT Goal Formulation: With patient Time For Goal Achievement: 04/11/20 Potential to Achieve Goals: Fair Progress towards PT goals: Progressing toward goals    Frequency    Min 3X/week      PT Plan Discharge plan needs to be updated    Co-evaluation              AM-PAC PT "6 Clicks" Mobility   Outcome Measure  Help needed turning from your back to your side while in a flat bed without using bedrails?: A Little Help needed moving from lying on your back to sitting on the side of a flat bed without using bedrails?: A Lot Help needed moving to and from a bed to a chair (including a wheelchair)?: A Lot Help needed standing up from a chair using your arms (e.g., wheelchair or bedside chair)?: A Little Help needed to walk in hospital room?: A Lot Help needed climbing 3-5 steps with a railing? : A Lot 6 Click Score: 14    End of Session Equipment Utilized During Treatment: Gait belt Activity Tolerance: Patient tolerated treatment well;Patient limited by fatigue Patient left: with call bell/phone within reach;with restraints reapplied;in bed;with bed alarm set (waist belt restraint) Nurse Communication: Mobility status;Other (comment) (PT assisted in cleaning pt up as pt with soiled urine brief, pt requests no more brief) PT Visit Diagnosis: Other abnormalities of gait and mobility (R26.89);Other symptoms and signs involving the nervous system (R29.898);Difficulty in walking, not elsewhere classified  (R26.2) Hemiplegia - caused by: Other Nontraumatic intracranial hemorrhage     Time: 4967-5916 PT Time Calculation (min) (ACUTE ONLY): 32 min  Charges:  $Gait Training: 8-22 mins $Therapeutic Activity: 8-22 mins                    [name], PT Acute Rehabilitation Services Pager [phone]  Office [phone]    Louis Matte 04/17/2020, 6:19 PM

## 2020-04-18 LAB — URINE CULTURE: Culture: >=100000 — AB

## 2020-04-18 LAB — GLUCOSE, CAPILLARY
Glucose-Capillary: 137 mg/dL — ABNORMAL HIGH (ref 70–99)
Glucose-Capillary: 112 mg/dL — ABNORMAL HIGH (ref 70–99)
Glucose-Capillary: 153 mg/dL — ABNORMAL HIGH (ref 70–99)
Glucose-Capillary: 126 mg/dL — ABNORMAL HIGH (ref 70–99)
Glucose-Capillary: 120 mg/dL — ABNORMAL HIGH (ref 70–99)

## 2020-04-18 NOTE — Progress Notes (Signed)
  NEUROSURGERY PROGRESS NOTE   No issues overnight.   EXAM:  BP (!) 107/58 (BP Location: Right Arm)   Pulse 83   Temp 98.1 F (36.7 C) (Axillary)   Resp 15   Ht 5\' 9"  (1.753 m)   Wt 59.5 kg   SpO2 98%   BMI 19.37 kg/m   Sleeping when I entered. Easily awakened Nods to questions MAE well Wounds c/d/i  IMPRESSION/PLAN 65 y.o. female s/p SAH and VPS.Neurologically stable.  - UTI: Bactrim 3/5. Follow culture. Labs unremarkable. - Therapy initially rec SNF, but patient progressing well and now thought to be candidate for CIR. Will place consult.

## 2020-04-18 NOTE — Progress Notes (Signed)
  Speech Language Pathology Treatment: Nada Boozer Speaking valve;Dysphagia  Patient Details Name: Vedanshi Massaro MRN: [id number] DOB: [birth year] Today's Date: 04/18/2020 Time: 7846-9629 SLP Time Calculation (min) (ACUTE ONLY): 16 min  Assessment / Plan / Recommendation Clinical Impression  Pt seen with PMV and for therapeutic intervention of swallow were the goals for today's session. Max attempts were made for pt to adequately wake with cold cloth, tactile and verbal stimuli. She intermittently responded to question keeping eyes closed. Targeted exercise to facilitate UES (upper esophageal sphincter) were made with Shaker exercise (pt supine and lifts head from bed and places chin toward chest looking at her toes). She was unable to execute due to lethargy. Pt then sat in upright position with oral care provided to stimulate saliva for swallow and produced only one volitional swallow. She declined ice this morning. PMV donned at onset of session remaining in place with vitals stable. Adequate air exchange with attempts to speak once unsuccessfully - subglottic stenosis. Continue efforts.    HPI HPI: 65 y/o presented with large subarachnoid hemorrhage due to ruptured PICA with resultant hydrocephalus.  Craniotomy 11/4, vented until 11/12 when pt was trached. Found to have multinodular goiter complicating respiratory failure so total thyroidectomy also completed on 11/12. Pt had been unable to functionaly utilize PMV due to copious secretions with inability to mobilize and severity of "pre stenosis" limiteds airflow severely. Direct laryngoscopy with ENT revealed "the cords were thickened and swollen; significant subglottic swelling especially on the left side and posteriorly; there definitely seem to be movement of the right cord; left cord may be paretic but it is a little hard to tell.  appeared to be an immature stenosis. MBS 11/23 profound dysphagia with abnormal anatomy. Repeat MBS 12/1 minimal  improvement- decr edema however significant subglottic edema/stenosis narrowing subglottic space. Bolus stops at UES with minimal opening with aspiration during/post swalow- continue NPO. Repeat MBS today for possible improvements.      SLP Plan  Continue with current plan of care       Recommendations  Diet recommendations: NPO      Patient may use Passy-Muir Speech Valve: During all therapies with supervision PMSV Supervision: Full         Oral Care Recommendations: Oral care QID Follow up Recommendations: Skilled Nursing facility SLP Visit Diagnosis: Aphonia (R49.1);Dysphagia, unspecified (R13.10) Plan: Continue with current plan of care       GO                Houston Siren 04/18/2020, 9:46 AM

## 2020-04-19 LAB — GLUCOSE, CAPILLARY
Glucose-Capillary: 118 mg/dL — ABNORMAL HIGH (ref 70–99)
Glucose-Capillary: 130 mg/dL — ABNORMAL HIGH (ref 70–99)
Glucose-Capillary: 146 mg/dL — ABNORMAL HIGH (ref 70–99)
Glucose-Capillary: 146 mg/dL — ABNORMAL HIGH (ref 70–99)
Glucose-Capillary: 156 mg/dL — ABNORMAL HIGH (ref 70–99)
Glucose-Capillary: 169 mg/dL — ABNORMAL HIGH (ref 70–99)
Glucose-Capillary: 88 mg/dL (ref 70–99)

## 2020-04-19 NOTE — Progress Notes (Signed)
  NEUROSURGERY PROGRESS NOTE   NAE  EXAM:  BP 122/79 (BP Location: Right Arm)   Pulse 79   Temp (!) 97.4 F (36.3 C) (Oral)   Resp 16   Ht 5\' 9"  (1.753 m)   Wt 59.5 kg   SpO2 96%   BMI 19.37 kg/m   Sleeping when I entered.  Easily awakened Nods to questions MAE well Wounds c/d/i  IMPRESSION/PLAN 65 y.o. female s/p SAH and VPS.Neurologically stable.  - IWO:[id number] day 4/5.  - possible CIR candidate

## 2020-04-19 NOTE — Progress Notes (Signed)
Patient desaturated to 85% while on 21% trach collar, RT suctioned patient and placed patient on 40% trach collar. Patient currently tolerating, RT will continue to monitor.

## 2020-04-19 NOTE — Progress Notes (Addendum)
Inpatient Rehab Admissions:  Inpatient Rehab Consult received.  I met with patient at the bedside for rehabilitation assessment and to discuss goals and expectations of an inpatient rehab admission.  Pt was lethargic. Pt agreeable to me contacting daughter, [name].  Called [name] and left message. Awaiting return call.     Addendum:  Pt's daughter, [name] returned call.  Explained CIR goals and expectations to her. She acknowledged understanding.  She is interested in pt pursuing CIR.  She indicated that pt would more than likely discharge to her house and that she is going to begin to arrange to have caregivers present during the day when she and her husband are working.  Will continue to follow.    Signed: Gayland Curry, [place], [place] Admissions Coordinator [phone]

## 2020-04-20 LAB — GLUCOSE, CAPILLARY
Glucose-Capillary: 101 mg/dL — ABNORMAL HIGH (ref 70–99)
Glucose-Capillary: 107 mg/dL — ABNORMAL HIGH (ref 70–99)
Glucose-Capillary: 131 mg/dL — ABNORMAL HIGH (ref 70–99)
Glucose-Capillary: 133 mg/dL — ABNORMAL HIGH (ref 70–99)
Glucose-Capillary: 155 mg/dL — ABNORMAL HIGH (ref 70–99)
Glucose-Capillary: 81 mg/dL (ref 70–99)

## 2020-04-20 NOTE — Progress Notes (Signed)
  NEUROSURGERY PROGRESS NOTE   No issues overnight.  EXAM:  BP 124/72 (BP Location: Right Arm)   Pulse 86   Temp 98.2 F (36.8 C) (Axillary)   Resp 15   Ht 5\' 9"  (1.753 m)   Wt 59.5 kg   SpO2 100%   BMI 19.37 kg/m   Sleeping when I entered.  Easily awakened Nods to questions MAE well Wounds c/d/i  IMPRESSION/PLAN 65 y.o. female s/p SAH and VPS.Neurologically stable.  - GUR:KYHCWCB day5/5.  -possible CIR candidate

## 2020-04-21 LAB — GLUCOSE, CAPILLARY
Glucose-Capillary: 112 mg/dL — ABNORMAL HIGH (ref 70–99)
Glucose-Capillary: 139 mg/dL — ABNORMAL HIGH (ref 70–99)
Glucose-Capillary: 148 mg/dL — ABNORMAL HIGH (ref 70–99)
Glucose-Capillary: 149 mg/dL — ABNORMAL HIGH (ref 70–99)
Glucose-Capillary: 98 mg/dL (ref 70–99)

## 2020-04-21 NOTE — Progress Notes (Signed)
  NEUROSURGERY PROGRESS NOTE   No issues overnight.   EXAM:  BP 130/86 (BP Location: Left Arm)   Pulse 80   Temp 97.6 F (36.4 C) (Axillary)   Resp 20   Ht 5\' 9"  (1.753 m)   Wt 59.5 kg   SpO2 97%   BMI 19.37 kg/m   Sleeping. Easily awakened Nods to questions MAE well Wounds c/d/i  IMPRESSION/PLAN 65 y.o. female s/p SAH and VPS.Neurologically stable.  - UTI:tx complete -possible CIR candidate

## 2020-04-21 NOTE — PMR Pre-admission (Signed)
PMR Admission Coordinator Pre-Admission Assessment  Patient: Stacy Moran is an 65 y.o., female MRN: [id number] DOB: 02/14/1956 Height: 5' 9"  (175.3 cm) Weight: 59.5 kg              Insurance Information HMO:     PPO:      PCP:      IPA:      80/20:      OTHER:  PRIMARY: Uninsured (Medicaid potential)      Policy#:       Subscriber:  CM Name:       Phone#:      Fax#:  Pre-Cert#:       Employer:  Benefits:  Phone #:      Name:  Eff. Date:      Deduct:       Out of Pocket Max:       Life Max:   CIR:       SNF:  Outpatient:      Co-Pay:  Home Health:       Co-Pay:  DME:      Co-Pay:  Providers:  SECONDARY:       Policy#:       Phone#:   Financial Counselor: Stefanie Libel      Phone#: [phone]  The "Data Collection Information Summary" for patients in Inpatient Rehabilitation Facilities with attached "Privacy Act [place] Records" was provided and verbally reviewed with: Family   Emergency Contact Information Contact Information    Name Relation Home Work [place], [name] Daughter   [phone]     Current Medical History  Patient Admitting Diagnosis: SAH  History of Present Illness: Pt is a 65 y.o. right-handed female with unremarkable past medical history on no prescription medications.  History taken from chart review due to nonverbal.  She presented on 01/30/2020 with acute headache progressively worsening with obtundation requiring intubation in the ED for airway protection.  Noted blood pressure 262/123 .  Admission chemistries unremarkable except potassium 3.4, glucose 178, hemoglobin A1c 6.2, alcohol negative, urine drug screen negative.  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  Patient underwent right frontal ventriculostomy on 02/26/2020 per Dr. Kathyrn Sheriff.  She then underwent diagnostic cerebral angiogram for further delineating the left PICA aneurysm.  It was noted not to be amenable to endovascular treatment and underwent suboccipital craniotomy for clipping of PICA aneurysm 01/31/2020.  Ultrasound soft tissue head and neck showed probable right thyroid nodule 2.3 x 2.3 x 2.4 cm.  Hospital course further complicated by prolonged vent support requiring tracheostomy as well as thyroidectomy on 02/08/2020 per Dr. Constance Holster and currently has a #6 cuffless flexible Shiley tracheostomy tube in place.  Patient currently remains n.p.o. with alternative means of nutritional support.  Repeat head CT on 11/19 personally reviewed-improving. She was cleared to begin subcutaneous heparin for DVT prophylaxis.  Patient is completing a 21-day course of Nimotop with close monitoring of blood pressure.  Code blue called d/t episode of bradycardia, lots of secretions, desaturation resulting in pt being placed on ventilator from 12/4-12/16.  On 12/15, pt has seizure and MRI showed worsening hydrocephalus.  On 12/16, pt had bedside ventriculostomy.Pt had VP shunt placement on 12/21. Therapy evaluations completed with recommendations of physical  medicine rehab consult.    Complete NIHSS TOTAL: 1 Glasgow Coma Scale Score: 14  Past Medical History  History reviewed. No pertinent past medical history.  Family History  family history is not on file.  Prior Rehab/Hospitalizations:  Has the patient had prior rehab or hospitalizations prior to admission? No  Has the patient had major surgery during 100 days prior to admission? Yes  Current Medications   Current Facility-Administered Medications:  .  0.9 %  sodium chloride infusion, , Intravenous, PRN, Vallarie Mare, MD .  acetaminophen (TYLENOL) tablet 650 mg, 650 mg, Oral, Q4H PRN **OR** acetaminophen (TYLENOL) 160 MG/5ML  solution 650 mg, 650 mg, Per Tube, Q4H PRN, 650 mg at 04/16/20 0925 **OR** acetaminophen (TYLENOL) suppository 650 mg, 650 mg, Rectal, Q4H PRN, Vallarie Mare, MD, 650 mg at 02/21/20 0020 .  amLODipine (NORVASC) tablet 10 mg, 10 mg, Per Tube, Daily, Vallarie Mare, MD, 10 mg at 04/20/20 2142 .  bisacodyl (DULCOLAX) suppository 10 mg, 10 mg, Rectal, Daily PRN, Vallarie Mare, MD, 10 mg at 03/04/20 0858 .  chlorhexidine gluconate (MEDLINE KIT) (PERIDEX) 0.12 % solution 15 mL, 15 mL, Mouth Rinse, BID, Vallarie Mare, MD, 15 mL at 04/21/20 0800 .  Chlorhexidine Gluconate Cloth 2 % PADS 6 each, 6 each, Topical, Daily, Vallarie Mare, MD, 6 each at 04/21/20 1122 .  docusate (COLACE) 50 MG/5ML liquid 100 mg, 100 mg, Per Tube, BID PRN, Ollis, Brandi L, NP, 100 mg at 04/06/20 0521 .  feeding supplement (OSMOLITE 1.2 CAL) liquid 1,000 mL, 1,000 mL, Per Tube, Continuous, Vallarie Mare, MD, Last Rate: 60 mL/hr at 04/21/20 0355, 1,000 mL at 04/21/20 0355 .  feeding supplement (PROSource TF) liquid 45 mL, 45 mL, Per Tube, BID, Vallarie Mare, MD, 45 mL at 04/21/20 1112 .  free water 200 mL, 200 mL, Per Tube, Q6H, Vallarie Mare, MD, 200 mL at 04/21/20 1122 .  guaiFENesin (ROBITUSSIN) 100 MG/5ML solution 100 mg, 5 mL, Per Tube, Q4H PRN, Vallarie Mare, MD, 100 mg at 04/12/20 1626 .  heparin injection 5,000 Units, 5,000 Units, Subcutaneous, Q8H, Vallarie Mare, MD, 5,000 Units at 04/21/20 0517 .  hydrALAZINE (APRESOLINE) injection 20 mg, 20 mg, Intravenous, Q6H PRN, Vallarie Mare, MD, 20 mg at 04/14/20 0345 .  hydrALAZINE (APRESOLINE) tablet 50 mg, 50 mg, Per Tube, Q8H, Vallarie Mare, MD, 50 mg at 04/21/20 0517 .  hydrocortisone cream 1 %, , Topical, TID PRN, Vallarie Mare, MD, Given at 03/09/20 [phone] .  insulin aspart (novoLOG) injection 0-20 Units, 0-20 Units, Subcutaneous, Q4H, Vallarie Mare, MD, 2 Units at 04/21/20 0800 .  labetalol (NORMODYNE) injection  10-20 mg, 10-20 mg, Intravenous, Q2H PRN, Vallarie Mare, MD, 20 mg at 03/21/20 1747 .  labetalol (NORMODYNE) tablet 300 mg, 300 mg, Per Tube, TID, Vallarie Mare, MD, 300 mg at 04/21/20 1112 .  [COMPLETED] levETIRAcetam (KEPPRA) 2,000 mg in sodium chloride 0.9 % 250 mL IVPB, 2,000 mg, Intravenous, Once, Last Rate: 900 mL/hr at 03/12/20 1500, 2,000 mg at 03/12/20 1500 **FOLLOWED BY** levETIRAcetam (KEPPRA) 100 MG/ML solution 750 mg, 750 mg, Per Tube, BID, Vallarie Mare, MD, 750 mg at 04/21/20 1112 .  levothyroxine (SYNTHROID) tablet 100 mcg, 100 mcg, Per Tube, Q0600, Vallarie Mare, MD, 100 mcg at 04/21/20 0517 .  MEDLINE mouth rinse, 15 mL, Mouth Rinse, q12n4p, Vallarie Mare, MD, 15 mL at 04/21/20 1123 .  ondansetron (ZOFRAN) 4 MG/5ML solution  4 mg, 4 mg, Per Tube, Q6H PRN, 4 mg at 03/08/20 1047 **OR** ondansetron (ZOFRAN) injection 4 mg, 4 mg, Intravenous, Q6H PRN, Vallarie Mare, MD .  [DISCONTINUED] pantoprazole (PROTONIX) EC tablet 40 mg, 40 mg, Oral, Daily **OR** pantoprazole sodium (PROTONIX) 40 mg/20 mL oral suspension 40 mg, 40 mg, Per Tube, Daily, Vallarie Mare, MD, 40 mg at 04/21/20 1112 .  polyethylene glycol (MIRALAX / GLYCOLAX) packet 17 g, 17 g, Per Tube, Daily PRN, [name], MD, 17 g at 04/17/20 0945 .  polyvinyl alcohol (LIQUIFILM TEARS) 1.4 % ophthalmic solution 1 drop, 1 drop, Left Eye, BID, Vallarie Mare, MD, 1 drop at 04/21/20 1122 .  QUEtiapine (SEROQUEL) tablet 25 mg, 25 mg, Per Tube, BID, Vallarie Mare, MD, 25 mg at 04/21/20 1112 .  scopolamine (TRANSDERM-SCOP) 1 MG/3DAYS 1.5 mg, 1 patch, Transdermal, Q72H, Erick Colace, NP, 1.5 mg at 04/19/20 1119 .  sodium chloride flush (NS) 0.9 % injection 10-40 mL, 10-40 mL, Intracatheter, Q12H, Vallarie Mare, MD, 10 mL at 04/21/20 1122 .  sodium chloride flush (NS) 0.9 % injection 10-40 mL, 10-40 mL, Intracatheter, PRN, Vallarie Mare, MD, 10 mL at 02/16/20 1628  Patients  Current Diet:  Diet Order            Diet NPO time specified  Diet effective midnight                 Precautions / Restrictions Precautions Precautions: Fall Precaution Comments: trach 28%/5L - taken off humidified RA for mobility Other Brace: abdominal binder - for PEG Restrictions Weight Bearing Restrictions: No   Has the patient had 2 or more falls or a fall with injury in the past year?No  Prior Activity Level Community (5-7x/wk): worked night shift at [employer]  Prior Functional Level Prior Function Comments: No family present to gather information, pt unable to participate, no information in the chart.  Self Care: Did the patient need help bathing, dressing, using the toilet or eating?  Independent  Indoor Mobility: Did the patient need assistance with walking from room to room (with or without device)? Independent  Stairs: Did the patient need assistance with internal or external stairs (with or without device)? Independent  Functional Cognition: Did the patient need help planning regular tasks such as shopping or remembering to take medications? Independent  Home Assistive Devices / Equipment    Prior Device Use: Indicate devices/aids used by the patient prior to current illness, exacerbation or injury? None of the above  Current Functional Level Cognition  Arousal/Alertness: Awake/alert Overall Cognitive Status: Impaired/Different from baseline Difficult to assess due to: Tracheostomy,Impaired communication Current Attention Level: Sustained Orientation Level: Oriented to person,Disoriented to place,Disoriented to time,Disoriented to situation Following Commands: Follows one step commands with increased time,Follows one step commands inconsistently Safety/Judgement: Decreased awareness of safety,Decreased awareness of deficits General Comments: Pt with flat and depressed-like affect, when asked what was wrong pt does not say anything. Pt attempting  to stand and let go of walker unexpectedly during mobility, cues for safety and waiting for PT to assist throughout mobility. Attention: Sustained Focused Attention: Appears intact Focused Attention Impairment: Verbal basic,Functional basic Sustained Attention: Impaired Sustained Attention Impairment: Functional basic Memory: Impaired Awareness: Impaired Awareness Impairment: Intellectual impairment,Emergent impairment,Anticipatory impairment Problem Solving: Impaired Problem Solving Impairment: Functional basic Behaviors: Restless Safety/Judgment: Impaired    Extremity Assessment (includes Sensation/Coordination)  Upper Extremity Assessment: RUE deficits/detail,LUE deficits/detail RUE Deficits / Details: Decreased strength and coorindation. Also noting edema  commands inconsistently Safety/Judgement:  Decreased awareness of safety,Decreased awareness of deficits General Comments: Pt with flat and depressed-like affect, when asked what was wrong pt does not say anything. Pt attempting to stand and let go of walker unexpectedly during mobility, cues for safety and waiting for PT to assist throughout mobility. Attention: Sustained Focused Attention: Appears intact Focused Attention Impairment: Verbal basic,Functional basic Sustained Attention: Impaired Sustained Attention Impairment: Functional basic Memory: Impaired Awareness: Impaired Awareness Impairment: Intellectual impairment,Emergent impairment,Anticipatory impairment Problem Solving: Impaired Problem Solving Impairment: Functional basic Behaviors: Restless Safety/Judgment: Impaired    Extremity Assessment (includes Sensation/Coordination)  Upper Extremity Assessment: RUE deficits/detail,LUE deficits/detail RUE Deficits / Details: Decreased strength and coorindation. Also noting edema at hand RUE Coordination: decreased fine motor,decreased gross motor LUE Deficits / Details: decreased grasp on RW LUE Coordination: decreased fine motor,decreased gross motor  Lower Extremity Assessment: Defer to PT evaluation RLE Deficits / Details: pt with small arc swing sitting EOB,  LLE Deficits / Details: no spontaneous or volitional movement noted to any stimuli    ADLs  Overall ADL's : Needs assistance/impaired Eating/Feeding: NPO Eating/Feeding Details (indicate cue type and reason): requesting food Grooming: Supervision/safety,Sitting,Wash/dry face Grooming Details (indicate cue type and reason): seated EOB Lower Body Bathing: Total assistance,Sitting/lateral leans Lower Body Bathing Details (indicate cue type and reason): simulated via LB dressing tasks; total A from sitting EOB Upper Body Dressing : Maximal assistance,Sitting Upper Body Dressing Details (indicate cue type and reason): donning new gown Lower Body Dressing: Total  assistance,Sitting/lateral leans Lower Body Dressing Details (indicate cue type and reason): to don socks Toilet Transfer: +2 for physical assistance,+2 for safety/equipment,Stand-pivot,BSC,Moderate assistance Toilet Transfer Details (indicate cue type and reason): minA to BSC, modA to return to EOB (+2) Toileting- Clothing Manipulation and Hygiene: Maximal assistance,+2 for physical assistance,+2 for safety/equipment,Sit to/from stand Toileting - Clothing Manipulation Details (indicate cue type and reason): assist for standing balance while NT assisting with pericare after voiding bladder Functional mobility during ADLs: Minimal assistance,Moderate assistance,+2 for physical assistance,+2 for safety/equipment (fluctuates, stand pivot) General ADL Comments: pt continues to present with cognitive impairments, generalized weakness and impaired balance    Mobility  Overal bed mobility: Needs Assistance Bed Mobility: Supine to Sit Rolling: Min guard Sidelying to sit: Mod assist Supine to sit: Mod assist Sit to supine: Min assist Sit to sidelying: Mod assist General bed mobility comments: up in recliner upon PT arrival to room, requests stay in recliner    Transfers  Overall transfer level: Needs assistance Equipment used: Rolling walker (2 wheeled) Transfers: Sit to/from Stand Sit to Stand: Mod assist Stand pivot transfers: Min assist,Mod assist,+2 physical assistance,+2 safety/equipment (fluctuates)  Lateral/Scoot Transfers: +2 physical assistance,Total assist,+2 safety/equipment General transfer comment: Mod assist for power up, rise and steady. STS x2, from recliner and other chair in room    Ambulation / Gait / Stairs / Wheelchair Mobility  Ambulation/Gait Ambulation/Gait assistance: Mod assist Gait Distance (Feet): 15 Feet (x2) Assistive device: Rolling walker (2 wheeled) Gait Pattern/deviations: Step-through pattern,Decreased stride length,Trunk flexed,Ataxic General Gait  Details: Mod assist for steadying, guiding RW and pt, cues for increasing LLE clearance, placement in RW, continuing to turn trunk and hips prior to sitting as pt attempts to sit prematurely x2. Gait velocity: decr Gait velocity interpretation: <1.31 ft/sec, indicative of household ambulator    Posture / Balance Dynamic Sitting Balance Sitting balance - Comments: able to sit edge of chair without difficulty, without back support Balance Overall balance assessment: Needs assistance Sitting-balance support: Single extremity supported,Feet supported   Access: Stairs to enter Entrance Stairs-Rails: Surveyor, mining of Steps: 1 flight Bathroom Shower/Tub: Optometrist: Yes How Accessible: Accessible via walker Home Care Services: No Additional Comments: No family present to get info.  Pt unable.  Discharge Living Setting Plans for Discharge Living Setting: House (Daughter's house) Type of Home at Discharge: House Discharge Home Layout: Multi-level,1/2 bath on main level Alternate Level Stairs-Rails: Right Alternate Level Stairs-Number of Steps: 12 Discharge Home Access: Stairs to enter Entrance  Stairs-Rails: None Entrance Stairs-Number of Steps: 2 Discharge Bathroom Shower/Tub: Horticulturist, commercial: Standard Discharge Bathroom Accessibility: Yes How Accessible: Accessible via walker Does the patient have any problems obtaining your medications?: No  Social/Family/Support Systems Patient Roles: Parent,Other (Comment) (grandparent) Anticipated Caregiver: [name], daughter Caregiver Availability: Intermittent Discharge Plan Discussed with Primary Caregiver: Yes Is Caregiver In Agreement with Plan?: Yes Does Caregiver/Family have Issues with Lodging/Transportation while Pt is in Rehab?: No   Goals Patient/Family Goal for Rehab: Min A-Supervision: PT/OT/ST Expected length of stay: 22-27 days Pt/Family Agrees to Admission and willing to participate: Yes Program Orientation Provided & Reviewed with Pt/Caregiver Including Roles  & Responsibilities: Yes   Decrease burden of Care through IP rehab admission: NA   Possible need for SNF placement upon discharge:NA   Patient Condition: This patient's medical and functional status has changed since the consult dated: 02/15/2020  in which the Rehabilitation Physician determined and documented that the patient's condition is appropriate for intensive rehabilitative care in an inpatient rehabilitation facility. See "History of Present Illness" (above) for medical update. Functional changes are: pt. Is now Mod A with transfers, ADLs and gait. Patient's medical and functional status update has been discussed with the Rehabilitation physician and patient remains appropriate for inpatient rehabilitation. Will admit to inpatient rehab today.  Preadmission Screen Completed By:  Bethel Born, CCC-SLP, 04/21/2020 2:16 PM ______________________________________________________________________   Discussed status with Dr. Naaman Plummer on 04/22/2020 at 33 and received approval for admission today.  Admission Coordinator:   Bethel Born, time 1200/Date 04/22/2020 with updates by Clemens Catholic

## 2020-04-21 NOTE — Progress Notes (Signed)
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  11/4, pt s/p L far-lateral craniotomy for clipping of PICA aneurysm. 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 12/4 pt with desat and bradycardia and was transferred to ICU and placed on the vent. Pt off vent 12/6. 12/15 seizure prompting MRI showing worsening hydrocephalus. 12/16 transferred to ICU for bedside ventriculostomy. Pt is now s/p VP shunt placement on 12/21.    PT Comments    Pt up in recliner upon arrival to room, resting. Pt appears with flat and depressed-like affect today, but when encouraged very participatory during session. Pt ambulatory for x2 room distances today, requiring seated rest to recover. Decreased safety awareness and L foot clearance noted during gait today, requires max cuing for safety during mobility. Pt also tolerated repeated transfers throughout session. Pt continuing to progress, CIR remains appropriate d/c plan.    Follow Up Recommendations  Supervision/Assistance - 24 hour;CIR     Equipment Recommendations  Wheelchair (measurements PT);Wheelchair cushion (measurements PT);3in1 (PT);Other (comment)    Recommendations for Other Services       Precautions / Restrictions Precautions Precautions: Fall Precaution Comments: trach 28%/5L - taken off humidified RA for mobility Required Braces or Orthoses: Other Brace Other Brace: abdominal binder - for PEG Restrictions Weight Bearing Restrictions: No    Mobility  Bed Mobility Overal bed mobility: Needs Assistance             General bed mobility comments: up in recliner upon PT arrival to  room, requests stay in recliner  Transfers Overall transfer level: Needs assistance Equipment used: Rolling walker (2 wheeled) Transfers: Sit to/from Stand Sit to Stand: Mod assist         General transfer comment: Mod assist for power up, rise and steady. STS x2, from recliner and other chair in room  Ambulation/Gait Ambulation/Gait assistance: Mod assist Gait Distance (Feet): 15 Feet (x2) Assistive device: Rolling walker (2 wheeled) Gait Pattern/deviations: Step-through pattern;Decreased stride length;Trunk flexed;Ataxic Gait velocity: decr   General Gait Details: Mod assist for steadying, guiding RW and pt, cues for increasing LLE clearance, placement in RW, continuing to turn trunk and hips prior to sitting as pt attempts to sit prematurely x2.   Stairs             Wheelchair Mobility    Modified Rankin (Stroke Patients Only) Modified Rankin (Stroke Patients Only) Pre-Morbid Rankin Score: No symptoms Modified Rankin: Moderately severe disability     Balance Overall balance assessment: Needs assistance Sitting-balance support: Single extremity supported;Feet supported Sitting balance-Leahy Scale: Fair Sitting balance - Comments: able to sit edge of chair without difficulty, without back support   Standing balance support: Bilateral upper extremity supported Standing balance-Leahy Scale: Poor Standing balance comment: reliant on external assist                            Cognition Arousal/Alertness: Awake/alert Behavior During Therapy: Flat affect;Impulsive Overall Cognitive Status: Impaired/Different from baseline Area of Impairment: Attention;Memory;Following commands                   Current Attention Level: Sustained Memory: Decreased recall of precautions  Following Commands: Follows one step commands with increased time;Follows one step commands inconsistently Safety/Judgement: Decreased awareness of safety;Decreased awareness of  deficits Awareness: Intellectual Problem Solving: Difficulty sequencing;Slow processing;Requires tactile cues;Requires verbal cues General Comments: Pt with flat and depressed-like affect, when asked what was wrong pt does not say anything. Pt attempting to stand and let go of walker unexpectedly during mobility, cues for safety and waiting for PT to assist throughout mobility.      Exercises General Exercises - Lower Extremity Long Arc Quad: AAROM;Both;15 reps;Seated    General Comments General comments (skin integrity, edema, etc.): VSS. Pt holding her chin after getting settled back in recliner post-session, PT asked what was wrong and pt states she bumped her chin on handle of RW. No mark observed when assessed, immediately or 30 minutes later. PT did not observe/hear/or in any way witness this, and PT assisted pt with all transfers during session. When asked if pt's chin hurt x1 minute later, prior to PT leaving, pt shakes head "no" and nods "yes" she is okay. RN and NT notified, PT went to check on pt 30 minutes later and no mark or pt discomfort observed.      Pertinent Vitals/Pain Pain Assessment: Faces Faces Pain Scale: Hurts a little bit Pain Location: generalized Pain Descriptors / Indicators: Discomfort;Guarding Pain Intervention(s): Limited activity within patient's tolerance;Monitored during session;Repositioned    Home Living                      Prior Function            PT Goals (current goals can now be found in the care plan section) Acute Rehab PT Goals Patient Stated Goal: none stated PT Goal Formulation: With patient Time For Goal Achievement: 05/05/20 Potential to Achieve Goals: Fair Progress towards PT goals: Progressing toward goals    Frequency    Min 3X/week      PT Plan Current plan remains appropriate    Co-evaluation              AM-PAC PT "6 Clicks" Mobility   Outcome Measure  Help needed turning from your back to your  side while in a flat bed without using bedrails?: A Little Help needed moving from lying on your back to sitting on the side of a flat bed without using bedrails?: A Little Help needed moving to and from a bed to a chair (including a wheelchair)?: A Lot Help needed standing up from a chair using your arms (e.g., wheelchair or bedside chair)?: A Lot Help needed to walk in hospital room?: A Lot Help needed climbing 3-5 steps with a railing? : A Lot 6 Click Score: 14    End of Session Equipment Utilized During Treatment: Gait belt Activity Tolerance: Patient tolerated treatment well;Patient limited by fatigue Patient left: with call bell/phone within reach;with restraints reapplied;in bed;with bed alarm set (waist belt restraint, applied in seated position in recliner) Nurse Communication: Mobility status;Other (comment) (PT assisted in cleaning pt up as pt with soiled urine brief, new brief donned with NT) PT Visit Diagnosis: Other abnormalities of gait and mobility (R26.89);Other symptoms and signs involving the nervous system (R29.898);Difficulty in walking, not elsewhere classified (R26.2) Hemiplegia - caused by: Other Nontraumatic intracranial hemorrhage     Time: 1152-1220 PT Time Calculation (min) (ACUTE ONLY): 28 min  Charges:  $Gait Training: 8-22 mins $Therapeutic Activity: 8-22 mins  [name], PT Acute Rehabilitation Services Pager [phone]  Office [phone]  Louis Matte 04/21/2020, 1:36 PM

## 2020-04-21 NOTE — Progress Notes (Signed)
Patient remains on [place]  Waist restraints. Patient lacks safety awareness,sitting up in bed and throwing legs out of bed. Restraint.released, skin assessment performed, bath and hygiene performed. ROM performed. Call bell within reach. External stimuli reduced, lights turned down, Turned and repositioned Q 2 hrs,patient redirected and reorientation to no avail. No distress noted with this assessment . Will continue monitoring.

## 2020-04-21 NOTE — Progress Notes (Signed)
RT had NT order trach for tracheostomy change.

## 2020-04-21 NOTE — Progress Notes (Signed)
Inpatient Rehab Admissions Coordinator:  Saw pt at bedside. Pt was lethargic. Pt only briefly opened eyes while AC talking.  Acknowledged with head nod to brief information provided regarding CIR and that I spoke with daughter about CIR.  Will continue to follow.   Gayland Curry, [place], [place] Admissions Coordinator [phone]

## 2020-04-22 ENCOUNTER — Inpatient Hospital Stay (HOSPITAL_COMMUNITY)
Admission: RE | Admit: 2020-04-22 | Discharge: 2020-05-21 | DRG: 056 | Disposition: A | Discharge status: Home Health | Payer: Self-pay | Source: Intra-hospital | Attending: Physical Medicine & Rehabilitation | Admitting: Physical Medicine & Rehabilitation

## 2020-04-22 ENCOUNTER — Encounter (HOSPITAL_COMMUNITY): Payer: Self-pay | Admitting: Physical Medicine & Rehabilitation

## 2020-04-22 ENCOUNTER — Other Ambulatory Visit: Payer: Self-pay

## 2020-04-22 DIAGNOSIS — E890 Postprocedural hypothyroidism: Secondary | ICD-10-CM | POA: Diagnosis present

## 2020-04-22 DIAGNOSIS — I10 Essential (primary) hypertension: Secondary | ICD-10-CM

## 2020-04-22 DIAGNOSIS — R509 Fever, unspecified: Secondary | ICD-10-CM

## 2020-04-22 DIAGNOSIS — Z681 Body mass index (BMI) 19 or less, adult: Secondary | ICD-10-CM

## 2020-04-22 DIAGNOSIS — I611 Nontraumatic intracerebral hemorrhage in hemisphere, cortical: Secondary | ICD-10-CM

## 2020-04-22 DIAGNOSIS — Z982 Presence of cerebrospinal fluid drainage device: Secondary | ICD-10-CM

## 2020-04-22 DIAGNOSIS — R1312 Dysphagia, oropharyngeal phase: Secondary | ICD-10-CM

## 2020-04-22 DIAGNOSIS — Z2989 Encounter for other specified prophylactic measures: Secondary | ICD-10-CM

## 2020-04-22 DIAGNOSIS — Z79899 Other long term (current) drug therapy: Secondary | ICD-10-CM

## 2020-04-22 DIAGNOSIS — E43 Unspecified severe protein-calorie malnutrition: Secondary | ICD-10-CM | POA: Insufficient documentation

## 2020-04-22 DIAGNOSIS — K5901 Slow transit constipation: Secondary | ICD-10-CM | POA: Diagnosis present

## 2020-04-22 DIAGNOSIS — R1319 Other dysphagia: Secondary | ICD-10-CM | POA: Diagnosis present

## 2020-04-22 DIAGNOSIS — Z931 Gastrostomy status: Secondary | ICD-10-CM

## 2020-04-22 DIAGNOSIS — Z781 Physical restraint status: Secondary | ICD-10-CM

## 2020-04-22 DIAGNOSIS — E162 Hypoglycemia, unspecified: Secondary | ICD-10-CM | POA: Diagnosis not present

## 2020-04-22 DIAGNOSIS — I610 Nontraumatic intracerebral hemorrhage in hemisphere, subcortical: Secondary | ICD-10-CM

## 2020-04-22 DIAGNOSIS — I69291 Dysphagia following other nontraumatic intracranial hemorrhage: Principal | ICD-10-CM

## 2020-04-22 DIAGNOSIS — R1310 Dysphagia, unspecified: Secondary | ICD-10-CM

## 2020-04-22 DIAGNOSIS — I619 Nontraumatic intracerebral hemorrhage, unspecified: Secondary | ICD-10-CM | POA: Diagnosis present

## 2020-04-22 DIAGNOSIS — R451 Restlessness and agitation: Secondary | ICD-10-CM

## 2020-04-22 DIAGNOSIS — I69391 Dysphagia following cerebral infarction: Secondary | ICD-10-CM

## 2020-04-22 DIAGNOSIS — D696 Thrombocytopenia, unspecified: Secondary | ICD-10-CM

## 2020-04-22 DIAGNOSIS — Z298 Encounter for other specified prophylactic measures: Secondary | ICD-10-CM

## 2020-04-22 DIAGNOSIS — Z79890 Hormone replacement therapy (postmenopausal): Secondary | ICD-10-CM

## 2020-04-22 DIAGNOSIS — D62 Acute posthemorrhagic anemia: Secondary | ICD-10-CM

## 2020-04-22 DIAGNOSIS — R739 Hyperglycemia, unspecified: Secondary | ICD-10-CM | POA: Diagnosis present

## 2020-04-22 DIAGNOSIS — D6959 Other secondary thrombocytopenia: Secondary | ICD-10-CM | POA: Diagnosis present

## 2020-04-22 DIAGNOSIS — Z930 Tracheostomy status: Secondary | ICD-10-CM

## 2020-04-22 LAB — HEMOGLOBIN A1C
Hgb A1c MFr Bld: 5.8 % — ABNORMAL HIGH (ref 4.8–5.6)
Mean Plasma Glucose: 119.76 mg/dL

## 2020-04-22 LAB — GLUCOSE, CAPILLARY
Glucose-Capillary: 159 mg/dL — ABNORMAL HIGH (ref 70–99)
Glucose-Capillary: 133 mg/dL — ABNORMAL HIGH (ref 70–99)
Glucose-Capillary: 151 mg/dL — ABNORMAL HIGH (ref 70–99)
Glucose-Capillary: 101 mg/dL — ABNORMAL HIGH (ref 70–99)

## 2020-04-22 MED ORDER — QUETIAPINE FUMARATE 25 MG PO TABS
25.0000 mg | ORAL_TABLET | Freq: Two times a day (BID) | ORAL | Status: DC
  Administered 2020-04-22 – 2020-04-25 (×6): 25 mg
  Filled 2020-04-22 (×7): qty 1

## 2020-04-22 MED ORDER — ACETAMINOPHEN 325 MG PO TABS
650.0000 mg | ORAL_TABLET | Freq: Four times a day (QID) | ORAL | Status: DC | PRN
  Administered 2020-05-03 – 2020-05-21 (×12): 650 mg
  Filled 2020-04-22 (×13): qty 2

## 2020-04-22 MED ORDER — POLYVINYL ALCOHOL 1.4 % OP SOLN: 1.0000 [drp] | OPHTHALMIC | Status: DC | PRN

## 2020-04-22 MED ORDER — FREE WATER
200.0000 mL | Freq: Four times a day (QID) | Status: DC
  Administered 2020-04-22 – 2020-05-09 (×68): 200 mL

## 2020-04-22 MED ORDER — POLYVINYL ALCOHOL 1.4 % OP SOLN
2.0000 [drp] | OPHTHALMIC | Status: DC | PRN
  Filled 2020-04-22: qty 15

## 2020-04-22 MED ORDER — INSULIN ASPART 100 UNIT/ML ~~LOC~~ SOLN
0.0000 [IU] | SUBCUTANEOUS | Status: DC
  Administered 2020-04-22 (×2): 4 [IU] via SUBCUTANEOUS
  Administered 2020-04-23 (×3): 3 [IU] via SUBCUTANEOUS
  Administered 2020-04-23 (×2): 4 [IU] via SUBCUTANEOUS
  Administered 2020-04-24: 3 [IU] via SUBCUTANEOUS
  Administered 2020-04-24: 4 [IU] via SUBCUTANEOUS
  Administered 2020-04-24 (×2): 3 [IU] via SUBCUTANEOUS
  Administered 2020-04-25: 4 [IU] via SUBCUTANEOUS
  Administered 2020-04-25: 3 [IU] via SUBCUTANEOUS
  Administered 2020-04-25: 4 [IU] via SUBCUTANEOUS
  Administered 2020-04-26: 3 [IU] via SUBCUTANEOUS
  Administered 2020-04-26: 4 [IU] via SUBCUTANEOUS
  Administered 2020-04-26: 3 [IU] via SUBCUTANEOUS
  Administered 2020-04-26: 4 [IU] via SUBCUTANEOUS
  Administered 2020-04-26 – 2020-04-27 (×5): 3 [IU] via SUBCUTANEOUS
  Administered 2020-04-28 (×2): 4 [IU] via SUBCUTANEOUS
  Administered 2020-04-28 – 2020-05-01 (×9): 3 [IU] via SUBCUTANEOUS
  Administered 2020-05-01: 4 [IU] via SUBCUTANEOUS
  Administered 2020-05-02 – 2020-05-10 (×19): 3 [IU] via SUBCUTANEOUS
  Administered 2020-05-10 – 2020-05-11 (×3): 4 [IU] via SUBCUTANEOUS
  Administered 2020-05-12: 3 [IU] via SUBCUTANEOUS
  Administered 2020-05-13 (×2): 4 [IU] via SUBCUTANEOUS
  Administered 2020-05-13 – 2020-05-18 (×12): 3 [IU] via SUBCUTANEOUS
  Administered 2020-05-18 (×2): 4 [IU] via SUBCUTANEOUS
  Administered 2020-05-19: 7 [IU] via SUBCUTANEOUS
  Administered 2020-05-19: 4 [IU] via SUBCUTANEOUS
  Administered 2020-05-20: 3 [IU] via SUBCUTANEOUS

## 2020-04-22 MED ORDER — PROSOURCE TF PO LIQD
45.0000 mL | Freq: Two times a day (BID) | ORAL | Status: DC
  Administered 2020-04-22 – 2020-05-21 (×58): 45 mL
  Filled 2020-04-22 (×60): qty 45

## 2020-04-22 MED ORDER — HYDRALAZINE HCL 50 MG PO TABS
50.0000 mg | ORAL_TABLET | Freq: Three times a day (TID) | ORAL | Status: DC
  Administered 2020-04-22 – 2020-05-21 (×86): 50 mg
  Filled 2020-04-22 (×87): qty 1

## 2020-04-22 MED ORDER — POLYETHYLENE GLYCOL 3350 17 G PO PACK
17.0000 g | PACK | Freq: Every day | ORAL | Status: DC | PRN
  Administered 2020-04-22 – 2020-05-09 (×6): 17 g
  Filled 2020-04-22 (×7): qty 1

## 2020-04-22 MED ORDER — LEVETIRACETAM 100 MG/ML PO SOLN: 750.0000 mg | Freq: Two times a day (BID) | ORAL | 12 refills | Status: DC

## 2020-04-22 MED ORDER — QUETIAPINE FUMARATE 25 MG PO TABS: 25.0000 mg | ORAL_TABLET | Freq: Two times a day (BID) | ORAL | Status: DC

## 2020-04-22 MED ORDER — LEVOTHYROXINE SODIUM 100 MCG PO TABS: 100.0000 ug | ORAL_TABLET | Freq: Every day | ORAL | Status: DC

## 2020-04-22 MED ORDER — CHLORHEXIDINE GLUCONATE 0.12 % MT SOLN
15.0000 mL | Freq: Two times a day (BID) | OROMUCOSAL | Status: DC
  Administered 2020-04-22 – 2020-05-21 (×52): 15 mL via OROMUCOSAL
  Filled 2020-04-22 (×50): qty 15

## 2020-04-22 MED ORDER — LEVETIRACETAM 100 MG/ML PO SOLN
750.0000 mg | Freq: Two times a day (BID) | ORAL | Status: DC
  Administered 2020-04-22 – 2020-04-25 (×6): 750 mg
  Filled 2020-04-22 (×7): qty 10

## 2020-04-22 MED ORDER — HEPARIN SODIUM (PORCINE) 5000 UNIT/ML IJ SOLN
5000.0000 [IU] | Freq: Three times a day (TID) | INTRAMUSCULAR | Status: DC
  Administered 2020-04-22 – 2020-05-21 (×86): 5000 [IU] via SUBCUTANEOUS
  Filled 2020-04-22 (×86): qty 1

## 2020-04-22 MED ORDER — LABETALOL HCL 100 MG PO TABS
300.0000 mg | ORAL_TABLET | Freq: Three times a day (TID) | ORAL | Status: DC
  Administered 2020-04-22 – 2020-05-21 (×86): 300 mg
  Filled 2020-04-22 (×87): qty 3

## 2020-04-22 MED ORDER — LEVETIRACETAM 500 MG PO TABS: 750.0000 mg | ORAL_TABLET | Freq: Two times a day (BID) | ORAL | Status: DC

## 2020-04-22 MED ORDER — LEVOTHYROXINE SODIUM 100 MCG PO TABS
100.0000 ug | ORAL_TABLET | Freq: Every day | ORAL | Status: DC
  Administered 2020-04-23 – 2020-05-21 (×29): 100 ug
  Filled 2020-04-22 (×29): qty 1

## 2020-04-22 MED ORDER — ORAL CARE MOUTH RINSE
15.0000 mL | Freq: Two times a day (BID) | OROMUCOSAL | Status: DC
  Administered 2020-04-22 – 2020-05-20 (×41): 15 mL via OROMUCOSAL

## 2020-04-22 MED ORDER — HYDRALAZINE HCL 50 MG PO TABS: 50.0000 mg | ORAL_TABLET | Freq: Three times a day (TID) | ORAL | Status: DC

## 2020-04-22 MED ORDER — PANTOPRAZOLE SODIUM 40 MG PO PACK
40.0000 mg | PACK | Freq: Every day | ORAL | Status: DC
  Administered 2020-04-23 – 2020-05-21 (×29): 40 mg
  Filled 2020-04-22 (×29): qty 20

## 2020-04-22 MED ORDER — AMLODIPINE BESYLATE 10 MG PO TABS: 10.0000 mg | ORAL_TABLET | Freq: Every day | ORAL | Status: DC

## 2020-04-22 MED ORDER — LABETALOL HCL 100 MG PO TABS: 300.0000 mg | ORAL_TABLET | Freq: Three times a day (TID) | ORAL | Status: DC

## 2020-04-22 MED ORDER — AMLODIPINE BESYLATE 10 MG PO TABS
10.0000 mg | ORAL_TABLET | Freq: Every day | ORAL | Status: DC
  Administered 2020-04-23 – 2020-05-21 (×29): 10 mg
  Filled 2020-04-22 (×9): qty 1
  Filled 2020-04-22: qty 2
  Filled 2020-04-22 (×20): qty 1

## 2020-04-22 MED ORDER — SCOPOLAMINE 1 MG/3DAYS TD PT72
1.0000 | MEDICATED_PATCH | TRANSDERMAL | Status: DC
  Administered 2020-04-25 – 2020-05-19 (×9): 1.5 mg via TRANSDERMAL
  Filled 2020-04-22 (×10): qty 1

## 2020-04-22 MED ORDER — OSMOLITE 1.2 CAL PO LIQD
1000.0000 mL | ORAL | Status: DC
  Administered 2020-04-22: 1000 mL
  Filled 2020-04-22 (×3): qty 1000

## 2020-04-22 NOTE — H&P (Signed)
Physical Medicine and Rehabilitation Admission H&P    Chief Complaint  Patient presents with  . Headache  : HPI: Stacy Moran is a 65 year old right-handed female with unremarkable past medical history on no prescription medications.  History taken from chart review due to patient being somnolent.  Presented 01/30/2020 with acute headache progressively worsening with obtundation requiring intubation in the ED for airway protection.  Noted blood pressure to 62/123.  Admission chemistries unremarkable except potassium 3.4 glucose 178 hemoglobin A1c 6.2 alcohol negative urine drug screen negative.  CT of the head demonstrated primarily posterior fossa SAH around the foramen magnum and perimedullary cistern with casting of the fourth ventricle and extension into the third and lateral ventricles and associated obstructive HCP.  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  Patient underwent right frontal ventriculostomy 01/30/2020 per Dr. Kathyrn Sheriff she did underwent diagnostic cerebral angiogram for further delineating left PICA aneurysm.  It was noted not to be amenable to endovascular treatment underwent suboccipital craniotomy for clipping of PICA aneurysm 01/31/2020.  Ultrasound soft tissue head and neck showed probable right thyroid nodule 2.3 x 2.3 x 2.4 cm.  Hospital course further complicated by prolonged ventilatory support requiring tracheostomy as well as thyroidectomy on 02/08/2020 per Dr. Constance Holster and maintained on #6 cuffless flexible Shiley tracheostomy tube.  Patient remained n.p.o. with alternative means of nutritional support with gastrostomy tube placed 02/22/2020 per interventional radiology Dr. Earleen Newport.   She was cleared to begin subcutaneous heparin for DVT prophylaxis.  She can pleaded a 21-day course of Nimotop for blood pressure control.  Palliative care has been consulted to establish goals of care.  Patient was weaned  from her external ventricular drain appear to be neurologically stable but progressively became more somnolent with repeat scan imaging demonstrated ventriculomegaly consistent with delayed onset of hydrocephalus. EEG was negative for seizure. 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 Kathyrn Sheriff. Therapy evaluations were completed due to patient's decrease in functional ability cognitive deficits multiple medical issues she was admitted for a comprehensive rehab program.  Review of Systems  Unable to perform ROS: Acuity of condition   History reviewed. No pertinent past medical history. Past Surgical History:  Procedure Laterality Date  . BURR HOLE N/A 03/18/2020   Procedure: Haskell Flirt;  Surgeon: [name], MD;  Location: [facility];  Service: Neurosurgery;  Laterality: N/A;  . CRANIOTOMY Left 01/30/2020   Procedure: LEFT FAR LATERAL CRANIOTOMY FOR ANEURYSM CLIPPING;  Surgeon: [name], MD;  Location: [facility];  Service: Neurosurgery;  Laterality: Left;  . DIRECT LARYNGOSCOPY N/A 02/29/2020   Procedure: DIRECT LARYNGOSCOPY;  Surgeon: [name], MD;  Location: [facility];  Service: ENT;  Laterality: N/A;  . IR ANGIO INTRA EXTRACRAN SEL INTERNAL CAROTID BILAT MOD SED  01/30/2020  . IR ANGIO VERTEBRAL SEL VERTEBRAL UNI L MOD SED  01/30/2020  . IR GASTROSTOMY TUBE MOD SED  02/22/2020  . LAPAROSCOPIC REVISION VENTRICULAR-PERITONEAL (V-P) SHUNT N/A 03/18/2020   Procedure: LAPAROSCOPIC REVISION VENTRICULAR-PERITONEAL (V-P) SHUNT;  Surgeon: [name], MD;  Location: [facility];  Service: General;  Laterality: N/A;  . RADIOLOGY WITH ANESTHESIA N/A 01/30/2020   Procedure: IR WITH ANESTHESIA;  Surgeon: [name], MD;  Location: [facility];  Service: Radiology;  Laterality: N/A;  . THYROIDECTOMY N/A 02/08/2020   Procedure: THYROIDECTOMY;  Surgeon: [name], MD;  Location: [facility];  Service: ENT;  Laterality: N/A;  . TRACHEOSTOMY TUBE PLACEMENT N/A 02/08/2020   Procedure: TRACHEOSTOMY;  Surgeon: [name], MD;  Location: [facility];  Service: ENT;  Laterality: N/A;  . TRACHEOSTOMY TUBE PLACEMENT N/A 02/29/2020   Procedure: TRACHEOSTOMY EXCHANGE;  Surgeon: [name], MD;  Location: [facility];  Service: ENT;  Laterality: N/A;  . VENTRICULOPERITONEAL SHUNT N/A 03/18/2020   Procedure: RIGHT SHUNT INSERTION VENTRICULAR-PERITONEAL/ BURR HOLE Evacuation of Subdural Hematoma;  Surgeon: [name], MD;  Location: [facility];  Service: Neurosurgery;  Laterality: N/A;   History reviewed. No pertinent family history. Social History:  has no history on file for tobacco use, alcohol use, and drug use. Allergies: No Known Allergies No medications prior to admission.    Drug Regimen Review Drug regimen was reviewed and remains appropriate with no significant issues identified  Home: Home Living Family/patient expects to be discharged to:: Unsure Living Arrangements: Alone Available Help at Discharge: Family,Available PRN/intermittently Type of Home: Apartment Home Access: Stairs to enter Entrance Stairs-Number of Steps: 1 flight Entrance Stairs-Rails: Right,Left Home Layout: One level Bathroom Shower/Tub: Chiropodist: Standard Bathroom Accessibility: Yes Additional Comments: No family present to get info.  Pt unable.   Functional History: Prior Function Comments: No family present to gather information, pt unable to participate, no information in the chart.  Functional Status:  Mobility: Bed Mobility Overal bed mobility: Needs Assistance Bed Mobility: Supine to Sit Rolling: Min guard Sidelying to sit: Mod assist Supine to sit: Mod assist Sit to supine: Min assist Sit to sidelying: Mod assist General bed mobility comments: up in recliner upon PT arrival to room, requests stay in  recliner Transfers Overall transfer level: Needs assistance Equipment used: Rolling walker (2 wheeled) Transfers: Sit to/from Stand Sit to Stand: Mod assist Stand pivot transfers: Min assist,Mod assist,+2 physical assistance,+2 safety/equipment (fluctuates)  Lateral/Scoot Transfers: +2 physical assistance,Total assist,+2 safety/equipment General transfer comment: Mod assist for power up, rise and steady. STS x2, from recliner and other chair in room Ambulation/Gait Ambulation/Gait assistance: Mod assist Gait Distance (Feet): 15 Feet (x2) Assistive device: Rolling walker (2 wheeled) Gait Pattern/deviations: Step-through pattern,Decreased stride length,Trunk flexed,Ataxic General Gait Details: Mod assist for steadying, guiding RW and pt, cues for increasing LLE clearance, placement in RW, continuing to turn trunk and hips prior to sitting as pt attempts to sit prematurely x2. Gait velocity: decr Gait velocity interpretation: <1.31 ft/sec, indicative of household ambulator    ADL: ADL Overall ADL's : Needs assistance/impaired Eating/Feeding: NPO Eating/Feeding Details (indicate cue type and reason): requesting food Grooming: Supervision/safety,Sitting,Wash/dry face Grooming Details (indicate cue type and reason): seated EOB Lower Body Bathing: Total assistance,Sitting/lateral leans Lower Body Bathing Details (indicate cue type and reason): simulated via LB dressing tasks; total A from sitting EOB Upper Body Dressing : Maximal assistance,Sitting Upper Body Dressing Details (indicate cue type and reason): donning new gown Lower Body Dressing: Total assistance,Sitting/lateral leans Lower Body Dressing Details (indicate cue type and reason): to don socks Toilet Transfer: +2 for physical assistance,+2 for safety/equipment,Stand-pivot,BSC,Moderate assistance Toilet Transfer Details (indicate cue type and reason): minA to BSC, modA to return to EOB (+2) Toileting- Clothing Manipulation and  Hygiene: Maximal assistance,+2 for physical assistance,+2 for safety/equipment,Sit to/from stand Toileting - Clothing Manipulation Details (indicate cue type and reason): assist for standing balance while NT assisting with pericare after voiding bladder Functional mobility during ADLs: Minimal assistance,Moderate assistance,+2 for physical assistance,+2 for safety/equipment (fluctuates, stand pivot) General ADL Comments: pt  continues to present with cognitive impairments, generalized weakness and impaired balance  Cognition: Cognition Overall Cognitive Status: Impaired/Different from baseline Arousal/Alertness: Awake/alert Orientation Level: Oriented to person,Disoriented to place,Disoriented to time,Disoriented to situation Attention: Sustained Focused Attention: Appears intact Focused Attention Impairment: Verbal basic,Functional basic Sustained Attention: Impaired Sustained Attention Impairment: Functional basic Memory: Impaired Awareness: Impaired Awareness Impairment: Intellectual impairment,Emergent impairment,Anticipatory impairment Problem Solving: Impaired Problem Solving Impairment: Functional basic Behaviors: Restless Safety/Judgment: Impaired Cognition Arousal/Alertness: Awake/alert Behavior During Therapy: Flat affect,Impulsive Overall Cognitive Status: Impaired/Different from baseline Area of Impairment: Attention,Memory,Following commands Orientation Level: Time,Situation Current Attention Level: Sustained Memory: Decreased recall of precautions Following Commands: Follows one step commands with increased time,Follows one step commands inconsistently Safety/Judgement: Decreased awareness of safety,Decreased awareness of deficits Awareness: Intellectual Problem Solving: Difficulty sequencing,Slow processing,Requires tactile cues,Requires verbal cues General Comments: Pt with flat and depressed-like affect, when asked what was wrong pt does not say anything. Pt  attempting to stand and let go of walker unexpectedly during mobility, cues for safety and waiting for PT to assist throughout mobility. Difficult to assess due to: Tracheostomy,Impaired communication  Physical Exam: Blood pressure 133/82, pulse 84, temperature 98.3 F (36.8 C), temperature source Axillary, resp. rate 17, height 5\' 9"  (1.753 m), weight 59.5 kg, SpO2 96 %. Physical Exam Constitutional:      Comments: Scalp incision/ VPS site CDI  HENT:     Head: Normocephalic.     Mouth/Throat:     Mouth: Mucous membranes are moist.  Eyes:     Pupils: Pupils are equal, round, and reactive to light.  Neck:     Comments: #6. Cuffless tracheostomy tube in place Cardiovascular:     Rate and Rhythm: Normal rate and regular rhythm.     Heart sounds: No murmur heard. No gallop.   Pulmonary:     Effort: Pulmonary effort is normal. No respiratory distress.     Breath sounds: No wheezing.  Abdominal:     General: There is no distension.     Palpations: Abdomen is soft.     Tenderness: There is no abdominal tenderness.     Comments: Gastrostomy tube in place, site clean and dry  Musculoskeletal:        General: Normal range of motion.     Cervical back: Normal range of motion.  Skin:    General: Skin is warm.  Neurological:     Mental Status: She is alert.     Comments: Patient is alert and restless. Nonverbal essentially. Will follow simple 1 step commands. Makes eye contact with examiner. She does use yes/ no head nods appropriate to simple questions. UE 4/5 prox to distal. LE: 3+ HF to 4+/5 distally. Senses pain in all 4's. DTR's 1+.   Psychiatric:     Comments: Flat but cooperative on exam     Results for orders placed or performed during the hospital encounter of 01/30/20 (from the past 48 hour(s))  Glucose, capillary     Status: Abnormal   Collection Time: 04/20/20 10:42 AM  Result Value Ref Range   Glucose-Capillary 155 (H) 70 - 99 mg/dL    Comment: Glucose reference range  applies only to samples taken after fasting for at least 8 hours.   Comment 1 Notify RN    Comment 2 Document in Chart   Glucose, capillary     Status: Abnormal   Collection Time: 04/20/20  3:22 PM  Result Value Ref Range   Glucose-Capillary 107 (H) 70 - 99 mg/dL    Comment:  Glucose reference range applies only to samples taken after fasting for at least 8 hours.   Comment 1 Notify RN    Comment 2 Document in Chart   Glucose, capillary     Status: Abnormal   Collection Time: 04/20/20  9:35 PM  Result Value Ref Range   Glucose-Capillary 131 (H) 70 - 99 mg/dL    Comment: Glucose reference range applies only to samples taken after fasting for at least 8 hours.  Glucose, capillary     Status: Abnormal   Collection Time: 04/20/20 11:40 PM  Result Value Ref Range   Glucose-Capillary 101 (H) 70 - 99 mg/dL    Comment: Glucose reference range applies only to samples taken after fasting for at least 8 hours.  Glucose, capillary     Status: Abnormal   Collection Time: 04/21/20  3:36 AM  Result Value Ref Range   Glucose-Capillary 112 (H) 70 - 99 mg/dL    Comment: Glucose reference range applies only to samples taken after fasting for at least 8 hours.  Glucose, capillary     Status: Abnormal   Collection Time: 04/21/20  7:43 AM  Result Value Ref Range   Glucose-Capillary 148 (H) 70 - 99 mg/dL    Comment: Glucose reference range applies only to samples taken after fasting for at least 8 hours.  Glucose, capillary     Status: Abnormal   Collection Time: 04/21/20 12:42 PM  Result Value Ref Range   Glucose-Capillary 139 (H) 70 - 99 mg/dL    Comment: Glucose reference range applies only to samples taken after fasting for at least 8 hours.   Comment 1 Notify RN    Comment 2 Document in Chart   Glucose, capillary     Status: Abnormal   Collection Time: 04/21/20  8:32 PM  Result Value Ref Range   Glucose-Capillary 149 (H) 70 - 99 mg/dL    Comment: Glucose reference range applies only to samples  taken after fasting for at least 8 hours.  Glucose, capillary     Status: None   Collection Time: 04/21/20 11:52 PM  Result Value Ref Range   Glucose-Capillary 98 70 - 99 mg/dL    Comment: Glucose reference range applies only to samples taken after fasting for at least 8 hours.  Glucose, capillary     Status: Abnormal   Collection Time: 04/22/20  4:19 AM  Result Value Ref Range   Glucose-Capillary 159 (H) 70 - 99 mg/dL    Comment: Glucose reference range applies only to samples taken after fasting for at least 8 hours.  Glucose, capillary     Status: Abnormal   Collection Time: 04/22/20  7:41 AM  Result Value Ref Range   Glucose-Capillary 133 (H) 70 - 99 mg/dL    Comment: Glucose reference range applies only to samples taken after fasting for at least 8 hours.   No results found.     Medical Problem List and Plan: 1. 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  Antithrombotics: -DVT/anticoagulation: Subcutaneous heparin  -antiplatelet therapy: N/A 3. Pain Management: Tylenol as needed 4. Mood: Provide emotional support  -antipsychotic agents: Seroquel 25 mg twice daily  -pt is fall risk 5. Neuropsych: This patient is not capable of making decisions on  her own behalf. 6. Skin/Wound Care: Routine skin checks 7. Fluids/Electrolytes/Nutrition: Routine in and outs with follow-up chemistries 8. Seizure prophylaxis. Keppra 700 mg twice daily 9. 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 Constance Holster.   -Maintained on #6 cuffless Shiley currently  -seems to be an opportunity to downsize soon 10. Dysphagia. Gastrostomy tube 02/22/2020 per interventional radiology Dr. Earleen Newport.   -Dietary and speech therapy follow-up 11. Hyperglycemia related to tube feeds. SSI 12. Hypothyroidism. Synthroid 13. Hypertension. Hydralazine 50 mg every 8 hours, Normodyne 300 mg 3 times daily, Norvasc 10 mg daily     Cathlyn Parsons, PA-C 04/22/2020

## 2020-04-22 NOTE — Progress Notes (Signed)
Nutrition Follow-up  DOCUMENTATION CODES:   Not applicable  INTERVENTION:  Continue tube feeds via PEG: - Osmolite 1.2 formula at goal rate of 60 ml/hr. - ProSource TF 45 ml BID  Tube feeding regimen provides1808kcal, 102grams of protein, and 1123m of H2O.  Continue200 ml free water every 6 hours per tube. Total free water: 19855mday  NUTRITION DIAGNOSIS:   Inadequate oral intake related to inability to eat as evidenced by NPO status; ongoing  GOAL:   Patient will meet greater than or equal to 90% of their needs; met with TF  MONITOR:   TF tolerance,Skin,Weight trends,Labs,I & O's  REASON FOR ASSESSMENT:   Ventilator    ASSESSMENT:   [age]ear old female who presented to the ED on 11/03 with headache. 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 Pt required intubation for airway protection.   11/03- admitted 11/04-SAH, L PICA aneurysm to OR for craniotomy, clipping  11/09-extubated but immediately re-intubated 11/12-s/p thyroidectomy with tracheostomy; cortrak placed(tip gastric) 11/16-EVD removed 11/17-trach exchange to facilitate PMV 11/20- Cortrakremoved by pt 11/22-trach change (pt self-decannulated) 11/26-PEG placed 12/01 - MBS with recommendation for NPO 12/03 - s/p direct laryngoscopy 12/04 - transferred to ICU, put on mechanical ventilation 12/07 - tolerating TCT; transferred to 3W 12/16 - MRI shows increased hydrocephalus; tx ICU for EVD placement. 12/20 - s/p shunt placement  Pt remains on trach collar and NPO status. Pt has been tolerating her tube feeds well. Will continue with current tube feeding regimen. Plans for discharge to CIR today. Labs and medications reviewed.   Diet Order:   Diet Order            Diet - low sodium heart healthy           Diet NPO time specified  Diet effective midnight                 EDUCATION NEEDS:   No education needs have  been identified at this time  Skin:  Skin Assessment: Skin Integrity Issues: Skin Integrity Issues:: Incisions Stage II: N/A Incisions: abdomen Other: N/A  Last BM:  1/18  Height:   Ht Readings from Last 1 Encounters:  03/04/20 _0  (1.753 m)    Weight:   Wt Readings from Last 1 Encounters:  04/22/20 59.5 kg    Ideal Body Weight:  65.9 kg  BMI:  Body mass index is 19.37 kg/m.  Estimated Nutritional Needs:   Kcal:  1700-1900  Protein:  95-110 grams  Fluid:  > 1.7 L/day  StCorrin ParkerMS, RD, LDN RD pager number/after hours weekend pager number on Amion.

## 2020-04-22 NOTE — Discharge Summary (Signed)
Physician Discharge Summary  Patient ID: Stacy Moran MRN: [id number] DOB/AGE: February 16, 1956 65 y.o.  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 female who presented to ED 11/3 with acute onset headache with progressive neurologic. She underwent stat head CT followed by CTA which revealed SAH secondary to left PICA aneurysm and resultant hydrocephalus. She underwent emergent placement of EVD due to hydrocephalus. On 11/4 patient underwent diagnostic cerebral angiogram for furhter characterization of the aneurysm and it was deemed not amenable to endovascular repair. Patient was therefore taken to OR for suboccipital craniotomy for clipping.  Post operative curse complicated by: - Prolonged ventilatory support requiring tracheostomy. Unfortunately patient had a large multinodual thyroid goiter and was unable to have trach placed at bedside. She was taken to OR by Dr Pollyann Kennedy (ENT) and underwent thyroidecomty and tracheostomy 11/12 - tracheobronchitis - tx with abx - dysphagia s/p PEG 11/26 - UTI - tx with abx - delayed recurrent hydrocephalus s/p VPS 12/21  Patient worked with PT/OT. Initially rec for SNF but due to progress, therapy updated dispo to CIR as patient was doing well. On 1/26 bed available to d/c to CIR. Patient discharged in hemodynamically stable  condition.  Treatments: Surgery - as above  Discharge Exam: Blood pressure 101/80, pulse 90, temperature 99 F (37.2 C), temperature source Oral, resp. rate (!) 23, height 5\' 9"  (1.753 m), weight 59.5 kg, SpO2 96 %. Sleeping. Easily awakened Nods to questions MAE well Wounds c/d/i  Disposition: Discharge disposition: [facility]-Another [facility] Care Institution Not Defined      DIET: NPO  Allergies as of 04/22/2020   No Known Allergies      Medication List     TAKE these medications    amLODipine 10 MG tablet Commonly known as: NORVASC Place 1 tablet (10 mg total) into feeding tube daily.   levETIRAcetam 100 MG/ML solution Commonly known as: KEPPRA Place 7.5 mLs (750 mg total) into feeding tube 2 (two) times daily.   levothyroxine 100 MCG tablet Commonly known as: SYNTHROID Place 1 tablet (100 mcg total) into feeding tube daily at 6 (six) AM. Start taking on: April 23, 2020   QUEtiapine 25 MG tablet Commonly known as: SEROQUEL Place 1 tablet (25 mg total) into feeding tube 2 (two) times daily.        Follow-up Information     Lisbeth Renshaw, MD. Schedule an appointment as soon as possible for a visit in 1 month(s).   Specialty: Neurosurgery Why: after discharge from CIR Contact information: [address] [place] [address] [phone]                 Signed: Alyson Ingles 04/22/2020, 12:23 PM

## 2020-04-22 NOTE — Progress Notes (Signed)
Patient ID: Stacy Moran, female   DOB: [birth year], 65 y.o.   MRN: [id number] Admit to unit, oriented to rehab, therapy schedule and plan of care given patients alertness , appears to understand what we have discussed with the patient. Margarito Liner

## 2020-04-22 NOTE — Progress Notes (Signed)
Inpatient Rehab Admissions Coordinator:    I have a CIR bed for this Pt. And will plan to admit her today. RN may call report to [phone].   Clemens Catholic, [place], [place] Admissions Coordinator  [phone] ([place]) [phone] (office)

## 2020-04-22 NOTE — Progress Notes (Signed)
  NEUROSURGERY PROGRESS NOTE   No issues overnight.  Patient with much noticeable flat affect No concerns this am  EXAM:  BP 133/82   Pulse 83   Temp 98.3 F (36.8 C) (Axillary)   Resp 13   Ht 5\' 9"  (1.753 m)   Wt 59.5 kg   SpO2 96%   BMI 19.37 kg/m   Sleeping. Easily awakened Flat affect Nods to questions MAE well Wounds c/d/i  IMPRESSION/PLAN 65 y.o. female s/p SAH and VPS.Neurologically stable.  - continue supportive care and encouragement - UTI:tx complete -possible CIR candidate

## 2020-04-22 NOTE — Progress Notes (Signed)
PMR Admission Coordinator Pre-Admission Assessment  Patient: Stacy Moran is an 65 y.o., female MRN: [id number] DOB: Apr 10, 1955 Height: 5' 9"  (175.3 cm) Weight: 59.5 kg                                                                                                                                                  Insurance Information HMO:     PPO:      PCP:      IPA:      80/20:      OTHER:  PRIMARY: Uninsured (Medicaid potential)      Policy#:       Subscriber:  CM Name:       Phone#:      Fax#:  Pre-Cert#:       Employer:  Benefits:  Phone #:      Name:  Eff. Date:      Deduct:       Out of Pocket Max:       Life Max:   CIR:       SNF:  Outpatient:      Co-Pay:  Home Health:       Co-Pay:  DME:      Co-Pay:  Providers:  SECONDARY:       Policy#:       Phone#:   Financial Counselor: Stefanie Libel      Phone#: [phone]  The "Data Collection Information Summary" for patients in Inpatient Rehabilitation Facilities with attached "Privacy Act [place] Records" was provided and verbally reviewed with: Family   Emergency Contact Information         Contact Information    Name Relation Home Work [place] Daughter   [phone]     Current Medical History  Patient Admitting Diagnosis: SAH  History of Present Illness: Ptis a 65 y.o.right-handed femalewith unremarkable past medical history on no prescription medications.History taken from chart review due to nonverbal. She presented on 01/30/2020 with acute headache progressively worsening with obtundation requiring intubation in the ED for airway protection. Noted blood pressure 262/123. Admission chemistries unremarkable exceptpotassium 3.4,glucose 178, hemoglobin A1c 6.2, alcohol negative, urine drug screen negative. CT the head demonstrated primarily posterior fossa SAHaround the foramen magnum and perimedullary cistern with casting of the fourth ventricle and extension into third and  lateral ventricles and associated obstructive HCP. 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 Patient underwent right frontal ventriculostomy on 11/30/2021per Dr. Kathyrn Sheriff. She then underwent diagnostic cerebral angiogram for further delineating the left PICA aneurysm. It was noted not to be amenable to endovascular treatment and underwent suboccipital craniotomy for clipping of PICA aneurysm 01/31/2020. Ultrasound soft tissue head and neck showed probable right thyroid nodule2.3 x 2.3 x 2.4 cm. Hospital course further complicated by prolonged ventsupport requiring  tracheostomy as well as thyroidectomy on 11/12/2021per Dr. Constance Holster and currently has a #6 cuffless flexible Shiley tracheostomy tube in place. Patient currently remains n.p.o. with alternative means of nutritional support.Repeat head CT on 11/19 personally reviewed-improving. She was cleared to begin subcutaneous heparin for DVT prophylaxis. Patient is completing a 21-day course of Nimotopwith close monitoring of blood pressure. Code blue called d/t episode of bradycardia, lots of secretions, desaturation resulting in pt being placed on ventilator from 12/4-12/16.  On 12/15, pt has seizure and MRI showed worsening hydrocephalus.  On 12/16, pt had bedside ventriculostomy.Pt had VP shunt placement on 12/21. Therapy evaluations completed with recommendations of physical medicine rehab consult.    Complete NIHSS TOTAL: 1 Glasgow Coma Scale Score: 14  Past Medical History  History reviewed. No pertinent past medical history.  Family History  family history is not on file.  Prior Rehab/Hospitalizations:  Has the patient had prior rehab or hospitalizations prior to admission? No  Has the patient had major surgery during 100 days prior to admission? Yes  Current Medications   Current Facility-Administered Medications:  .  0.9 %  sodium chloride infusion,  , Intravenous, PRN, Vallarie Mare, MD .  acetaminophen (TYLENOL) tablet 650 mg, 650 mg, Oral, Q4H PRN **OR** acetaminophen (TYLENOL) 160 MG/5ML solution 650 mg, 650 mg, Per Tube, Q4H PRN, 650 mg at 04/16/20 0925 **OR** acetaminophen (TYLENOL) suppository 650 mg, 650 mg, Rectal, Q4H PRN, Vallarie Mare, MD, 650 mg at 02/21/20 0020 .  amLODipine (NORVASC) tablet 10 mg, 10 mg, Per Tube, Daily, Vallarie Mare, MD, 10 mg at 04/20/20 2142 .  bisacodyl (DULCOLAX) suppository 10 mg, 10 mg, Rectal, Daily PRN, Vallarie Mare, MD, 10 mg at 03/04/20 0858 .  chlorhexidine gluconate (MEDLINE KIT) (PERIDEX) 0.12 % solution 15 mL, 15 mL, Mouth Rinse, BID, Vallarie Mare, MD, 15 mL at 04/21/20 0800 .  Chlorhexidine Gluconate Cloth 2 % PADS 6 each, 6 each, Topical, Daily, Vallarie Mare, MD, 6 each at 04/21/20 1122 .  docusate (COLACE) 50 MG/5ML liquid 100 mg, 100 mg, Per Tube, BID PRN, Ollis, Brandi L, NP, 100 mg at 04/06/20 0521 .  feeding supplement (OSMOLITE 1.2 CAL) liquid 1,000 mL, 1,000 mL, Per Tube, Continuous, Vallarie Mare, MD, Last Rate: 60 mL/hr at 04/21/20 0355, 1,000 mL at 04/21/20 0355 .  feeding supplement (PROSource TF) liquid 45 mL, 45 mL, Per Tube, BID, Vallarie Mare, MD, 45 mL at 04/21/20 1112 .  free water 200 mL, 200 mL, Per Tube, Q6H, Vallarie Mare, MD, 200 mL at 04/21/20 1122 .  guaiFENesin (ROBITUSSIN) 100 MG/5ML solution 100 mg, 5 mL, Per Tube, Q4H PRN, Vallarie Mare, MD, 100 mg at 04/12/20 1626 .  heparin injection 5,000 Units, 5,000 Units, Subcutaneous, Q8H, Vallarie Mare, MD, 5,000 Units at 04/21/20 0517 .  hydrALAZINE (APRESOLINE) injection 20 mg, 20 mg, Intravenous, Q6H PRN, Vallarie Mare, MD, 20 mg at 04/14/20 0345 .  hydrALAZINE (APRESOLINE) tablet 50 mg, 50 mg, Per Tube, Q8H, Vallarie Mare, MD, 50 mg at 04/21/20 0517 .  hydrocortisone cream 1 %, , Topical, TID PRN, Vallarie Mare, MD, Given at 03/09/20 [phone] .  insulin  aspart (novoLOG) injection 0-20 Units, 0-20 Units, Subcutaneous, Q4H, Vallarie Mare, MD, 2 Units at 04/21/20 0800 .  labetalol (NORMODYNE) injection 10-20 mg, 10-20 mg, Intravenous, Q2H PRN, Vallarie Mare, MD, 20 mg at 03/21/20 1747 .  labetalol (NORMODYNE) tablet 300 mg, 300 mg, Per Tube,  TID, Vallarie Mare, MD, 300 mg at 04/21/20 1112 .  [COMPLETED] levETIRAcetam (KEPPRA) 2,000 mg in sodium chloride 0.9 % 250 mL IVPB, 2,000 mg, Intravenous, Once, Last Rate: 900 mL/hr at 03/12/20 1500, 2,000 mg at 03/12/20 1500 **FOLLOWED BY** levETIRAcetam (KEPPRA) 100 MG/ML solution 750 mg, 750 mg, Per Tube, BID, Vallarie Mare, MD, 750 mg at 04/21/20 1112 .  levothyroxine (SYNTHROID) tablet 100 mcg, 100 mcg, Per Tube, Q0600, Vallarie Mare, MD, 100 mcg at 04/21/20 0517 .  MEDLINE mouth rinse, 15 mL, Mouth Rinse, q12n4p, Vallarie Mare, MD, 15 mL at 04/21/20 1123 .  ondansetron (ZOFRAN) 4 MG/5ML solution 4 mg, 4 mg, Per Tube, Q6H PRN, 4 mg at 03/08/20 1047 **OR** ondansetron (ZOFRAN) injection 4 mg, 4 mg, Intravenous, Q6H PRN, Vallarie Mare, MD .  [DISCONTINUED] pantoprazole (PROTONIX) EC tablet 40 mg, 40 mg, Oral, Daily **OR** pantoprazole sodium (PROTONIX) 40 mg/20 mL oral suspension 40 mg, 40 mg, Per Tube, Daily, Vallarie Mare, MD, 40 mg at 04/21/20 1112 .  polyethylene glycol (MIRALAX / GLYCOLAX) packet 17 g, 17 g, Per Tube, Daily PRN, [name], MD, 17 g at 04/17/20 0945 .  polyvinyl alcohol (LIQUIFILM TEARS) 1.4 % ophthalmic solution 1 drop, 1 drop, Left Eye, BID, Vallarie Mare, MD, 1 drop at 04/21/20 1122 .  QUEtiapine (SEROQUEL) tablet 25 mg, 25 mg, Per Tube, BID, Vallarie Mare, MD, 25 mg at 04/21/20 1112 .  scopolamine (TRANSDERM-SCOP) 1 MG/3DAYS 1.5 mg, 1 patch, Transdermal, Q72H, Erick Colace, NP, 1.5 mg at 04/19/20 1119 .  sodium chloride flush (NS) 0.9 % injection 10-40 mL, 10-40 mL, Intracatheter, Q12H, Vallarie Mare, MD, 10 mL at 04/21/20  1122 .  sodium chloride flush (NS) 0.9 % injection 10-40 mL, 10-40 mL, Intracatheter, PRN, Vallarie Mare, MD, 10 mL at 02/16/20 1628  Patients Current Diet:     Diet Order                  Diet NPO time specified  Diet effective midnight                  Precautions / Restrictions Precautions Precautions: Fall Precaution Comments: trach 28%/5L - taken off humidified RA for mobility Other Brace: abdominal binder - for PEG Restrictions Weight Bearing Restrictions: No   Has the patient had 2 or more falls or a fall with injury in the past year?No  Prior Activity Level Community (5-7x/wk): worked night shift at [employer]  Prior Functional Level Prior Function Comments: No family present to gather information, pt unable to participate, no information in the chart.  Self Care: Did the patient need help bathing, dressing, using the toilet or eating?  Independent  Indoor Mobility: Did the patient need assistance with walking from room to room (with or without device)? Independent  Stairs: Did the patient need assistance with internal or external stairs (with or without device)? Independent  Functional Cognition: Did the patient need help planning regular tasks such as shopping or remembering to take medications? Independent  Home Assistive Devices / Equipment  Prior Device Use: Indicate devices/aids used by the patient prior to current illness, exacerbation or injury? None of the above  Current Functional Level Cognition  Arousal/Alertness: Awake/alert Overall Cognitive Status: Impaired/Different from baseline Difficult to assess due to: Tracheostomy,Impaired communication Current Attention Level: Sustained Orientation Level: Oriented to person,Disoriented to place,Disoriented to time,Disoriented to situation Following Commands: Follows one step commands with increased time,Follows one step  commands inconsistently Safety/Judgement:  Decreased awareness of safety,Decreased awareness of deficits General Comments: Pt with flat and depressed-like affect, when asked what was wrong pt does not say anything. Pt attempting to stand and let go of walker unexpectedly during mobility, cues for safety and waiting for PT to assist throughout mobility. Attention: Sustained Focused Attention: Appears intact Focused Attention Impairment: Verbal basic,Functional basic Sustained Attention: Impaired Sustained Attention Impairment: Functional basic Memory: Impaired Awareness: Impaired Awareness Impairment: Intellectual impairment,Emergent impairment,Anticipatory impairment Problem Solving: Impaired Problem Solving Impairment: Functional basic Behaviors: Restless Safety/Judgment: Impaired    Extremity Assessment (includes Sensation/Coordination)  Upper Extremity Assessment: RUE deficits/detail,LUE deficits/detail RUE Deficits / Details: Decreased strength and coorindation. Also noting edema at hand RUE Coordination: decreased fine motor,decreased gross motor LUE Deficits / Details: decreased grasp on RW LUE Coordination: decreased fine motor,decreased gross motor  Lower Extremity Assessment: Defer to PT evaluation RLE Deficits / Details: pt with small arc swing sitting EOB,  LLE Deficits / Details: no spontaneous or volitional movement noted to any stimuli    ADLs  Overall ADL's : Needs assistance/impaired Eating/Feeding: NPO Eating/Feeding Details (indicate cue type and reason): requesting food Grooming: Supervision/safety,Sitting,Wash/dry face Grooming Details (indicate cue type and reason): seated EOB Lower Body Bathing: Total assistance,Sitting/lateral leans Lower Body Bathing Details (indicate cue type and reason): simulated via LB dressing tasks; total A from sitting EOB Upper Body Dressing : Maximal assistance,Sitting Upper Body Dressing Details (indicate cue type and reason): donning new gown Lower Body Dressing: Total  assistance,Sitting/lateral leans Lower Body Dressing Details (indicate cue type and reason): to don socks Toilet Transfer: +2 for physical assistance,+2 for safety/equipment,Stand-pivot,BSC,Moderate assistance Toilet Transfer Details (indicate cue type and reason): minA to BSC, modA to return to EOB (+2) Toileting- Clothing Manipulation and Hygiene: Maximal assistance,+2 for physical assistance,+2 for safety/equipment,Sit to/from stand Toileting - Clothing Manipulation Details (indicate cue type and reason): assist for standing balance while NT assisting with pericare after voiding bladder Functional mobility during ADLs: Minimal assistance,Moderate assistance,+2 for physical assistance,+2 for safety/equipment (fluctuates, stand pivot) General ADL Comments: pt continues to present with cognitive impairments, generalized weakness and impaired balance    Mobility  Overal bed mobility: Needs Assistance Bed Mobility: Supine to Sit Rolling: Min guard Sidelying to sit: Mod assist Supine to sit: Mod assist Sit to supine: Min assist Sit to sidelying: Mod assist General bed mobility comments: up in recliner upon PT arrival to room, requests stay in recliner    Transfers  Overall transfer level: Needs assistance Equipment used: Rolling walker (2 wheeled) Transfers: Sit to/from Stand Sit to Stand: Mod assist Stand pivot transfers: Min assist,Mod assist,+2 physical assistance,+2 safety/equipment (fluctuates)  Lateral/Scoot Transfers: +2 physical assistance,Total assist,+2 safety/equipment General transfer comment: Mod assist for power up, rise and steady. STS x2, from recliner and other chair in room    Ambulation / Gait / Stairs / Wheelchair Mobility  Ambulation/Gait Ambulation/Gait assistance: Mod assist Gait Distance (Feet): 15 Feet (x2) Assistive device: Rolling walker (2 wheeled) Gait Pattern/deviations: Step-through pattern,Decreased stride length,Trunk flexed,Ataxic General Gait  Details: Mod assist for steadying, guiding RW and pt, cues for increasing LLE clearance, placement in RW, continuing to turn trunk and hips prior to sitting as pt attempts to sit prematurely x2. Gait velocity: decr Gait velocity interpretation: <1.31 ft/sec, indicative of household ambulator    Posture / Balance Dynamic Sitting Balance Sitting balance - Comments: able to sit edge of chair without difficulty, without back support Balance Overall balance assessment: Needs assistance Sitting-balance support: Single extremity supported,Feet supported  Sitting balance-Leahy Scale: Fair Sitting balance - Comments: able to sit edge of chair without difficulty, without back support Postural control: Posterior lean,Right lateral lean Standing balance support: Bilateral upper extremity supported Standing balance-Leahy Scale: Poor Standing balance comment: reliant on external assist    Special needs/care consideration Oxygen 21% FiO2, Trach size #6 Shiley cuffless, Skin Abrasion: neck/medial; Catheter entry/exit: abdomen/mid; Surgical incision: abdomen, head, Diabetic management novoLOG 0-20 units: every 4 hours, Bowel and Bladder incontinence, Peg tube, External urinary catheter and Designated visitor [name], daughter     Previous Home Environment (from acute therapy documentation) Living Arrangements: Alone Available Help at Discharge: Family,Available PRN/intermittently Type of Home: Apartment Home Layout: One level Home Access: Stairs to enter Entrance Stairs-Rails: Surveyor, mining of Steps: 1 flight Bathroom Shower/Tub: Chiropodist: Standard Bathroom Accessibility: Yes How Accessible: Accessible via walker Home Care Services: No Additional Comments: No family present to get info.  Pt unable.  Discharge Living Setting Plans for Discharge Living Setting: House (Daughter's house) Type of Home at Discharge: House Discharge Home Layout:  Multi-level,1/2 bath on main level Alternate Level Stairs-Rails: Right Alternate Level Stairs-Number of Steps: 12 Discharge Home Access: Stairs to enter Entrance Stairs-Rails: None Entrance Stairs-Number of Steps: 2 Discharge Bathroom Shower/Tub: Horticulturist, commercial: Standard Discharge Bathroom Accessibility: Yes How Accessible: Accessible via walker Does the patient have any problems obtaining your medications?: No  Social/Family/Support Systems Patient Roles: Parent,Other (Comment) (grandparent) Anticipated Caregiver: [name], daughter Caregiver Availability: Intermittent Discharge Plan Discussed with Primary Caregiver: Yes Is Caregiver In Agreement with Plan?: Yes Does Caregiver/Family have Issues with Lodging/Transportation while Pt is in Rehab?: No   Goals Patient/Family Goal for Rehab: Min A-Supervision: PT/OT/ST Expected length of stay: 22-27 days Pt/Family Agrees to Admission and willing to participate: Yes Program Orientation Provided & Reviewed with Pt/Caregiver Including Roles  & Responsibilities: Yes   Decrease burden of Care through IP rehab admission: NA   Possible need for SNF placement upon discharge:NA   Patient Condition: This patient's medical and functional status has changed since the consult dated: 02/15/2020  in which the Rehabilitation Physician determined and documented that the patient's condition is appropriate for intensive rehabilitative care in an inpatient rehabilitation facility. See "History of Present Illness" (above) for medical update. Functional changes are: pt. Is now Mod A with transfers, ADLs and gait. Patient's medical and functional status update has been discussed with the Rehabilitation physician and patient remains appropriate for inpatient rehabilitation. Will admit to inpatient rehab today.  Preadmission Screen Completed By:  Bethel Born, CCC-SLP, 04/21/2020 2:16  PM ______________________________________________________________________   Discussed status with Dr. Naaman Plummer on 04/22/2020 at 49 and received approval for admission today.  Admission Coordinator:  Bethel Born, time 1200/Date 04/22/2020 with updates by Clemens Catholic

## 2020-04-22 NOTE — Progress Notes (Signed)
Report given to 4MW RN. SWOT at bedside to transfer Pt to new room.

## 2020-04-22 NOTE — Evaluation (Incomplete)
Speech Language Pathology Assessment and Plan  Patient Details  Name: Stacy Moran MRN: [id number] Date of Birth: May 22, 1955  SLP Diagnosis:    Rehab Potential:   ELOS:      {chl ip rehab slp time calculations:304100500}   Hospital Problem: Active Problems:   ICH (intracerebral hemorrhage) ([place])  Past Medical History: No past medical history on file. Past Surgical History:  Past Surgical History:  Procedure Laterality Date  . BURR HOLE N/A 03/18/2020   Procedure: Haskell Flirt;  Surgeon: [name], MD;  Location: [place];  Service: Neurosurgery;  Laterality: N/A;  . CRANIOTOMY Left 01/30/2020   Procedure: LEFT FAR LATERAL CRANIOTOMY FOR ANEURYSM CLIPPING;  Surgeon: [name], MD;  Location: [place];  Service: Neurosurgery;  Laterality: Left;  . DIRECT LARYNGOSCOPY N/A 02/29/2020   Procedure: DIRECT LARYNGOSCOPY;  Surgeon: [name], MD;  Location: [place];  Service: ENT;  Laterality: N/A;  . IR ANGIO INTRA EXTRACRAN SEL INTERNAL CAROTID BILAT MOD SED  01/30/2020  . IR ANGIO VERTEBRAL SEL VERTEBRAL UNI L MOD SED  01/30/2020  . IR GASTROSTOMY TUBE MOD SED  02/22/2020  . LAPAROSCOPIC REVISION VENTRICULAR-PERITONEAL (V-P) SHUNT N/A 03/18/2020   Procedure: LAPAROSCOPIC REVISION VENTRICULAR-PERITONEAL (V-P) SHUNT;  Surgeon: [name], MD;  Location: [place];  Service: General;  Laterality: N/A;  . RADIOLOGY WITH ANESTHESIA N/A 01/30/2020   Procedure: IR WITH ANESTHESIA;  Surgeon: [name], MD;  Location: [place];  Service: Radiology;  Laterality: N/A;  . THYROIDECTOMY N/A 02/08/2020   Procedure: THYROIDECTOMY;  Surgeon: [name], MD;  Location: [place];  Service: ENT;  Laterality: N/A;  . TRACHEOSTOMY TUBE PLACEMENT N/A 02/08/2020   Procedure: TRACHEOSTOMY;  Surgeon: [name], MD;  Location: [place];  Service: ENT;  Laterality: N/A;  . TRACHEOSTOMY TUBE PLACEMENT N/A 02/29/2020   Procedure: TRACHEOSTOMY EXCHANGE;  Surgeon: [name], MD;  Location: [place];   Service: ENT;  Laterality: N/A;  . VENTRICULOPERITONEAL SHUNT N/A 03/18/2020   Procedure: RIGHT SHUNT INSERTION VENTRICULAR-PERITONEAL/ BURR HOLE Evacuation of Subdural Hematoma;  Surgeon: [name], MD;  Location: [place];  Service: Neurosurgery;  Laterality: N/A;    Assessment / Plan / Recommendation Clinical Impression Stacy Moran a 65 y.o.right-handed femalewith unremarkable past medical history on no prescription medications.History taken from chart review due to nonverbal. She presented on 01/30/2020 with acute headache progressively worsening with obtundation requiring intubation in the ED for airway protection. Noted blood pressure 262/123. Admission chemistries unremarkable exceptpotassium 3.4,glucose 178, hemoglobin A1c 6.2, alcohol negative, urine drug screen negative. CT the head demonstrated primarily posterior fossa SAHaround the foramen magnum and perimedullary cistern with casting of the fourth ventricle and extension into third and lateral ventricles and associated obstructive HCP. 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 Patient underwent right frontal ventriculostomy on 11/30/2021per Dr. Kathyrn Sheriff. She then underwent diagnostic cerebral angiogram for further delineating the left PICA aneurysm. It was noted not to be amenable to endovascular treatment and underwent suboccipital craniotomy for clipping of PICA aneurysm 01/31/2020. Ultrasound soft tissue head and neck showed probable right thyroid nodule2.3 x 2.3 x 2.4 cm. Hospital course further complicated by prolonged ventsupport requiring tracheostomy as well as thyroidectomy on 11/12/2021per Dr. Constance Holster and currently has a #6 cuffless flexible Shiley tracheostomy tube in place. Patient currently remains n.p.o. with alternative means of nutritional support.Repeat head CT on 11/19 personally reviewed-improving. She was cleared to begin  subcutaneous heparin for DVT prophylaxis. Patient is  completing a 21-day course of Nimotopwith close monitoring of blood pressure. Therapy evaluations completed with recommendations of physical medicine rehab consult.     Skilled Therapeutic Interventions          ***  SLP Assessment       Recommendations       SLP Frequency     SLP Duration  SLP Intensity  SLP Treatment/Interventions            Pain Pain Assessment Pain Scale: Faces Faces Pain Scale: No hurt  Prior Functioning    SLP Evaluation Cognition Orientation Level: Oriented to person  Comprehension   Expression   Oral Motor    Care Tool Care Tool Cognition Expression of Ideas and Wants     Understanding Verbal and Non-Verbal Content     Memory/Recall Ability *first 3 days only       PMSV Assessment  PMSV Trial    Bedside Swallowing Assessment General    Oral Care Assessment Does patient have any of the following "high(er) risk" factors?: Tracheostomy with trach collar 24 hrs./day Ice Chips   Thin Liquid   Nectar Thick   Honey Thick   Puree   Solid   BSE Assessment    Short Term Goals: {VTV:1504136}  Refer to Care Plan for Long Term Goals  Recommendations for other services: {RECOMMENDATIONS FOR OTHER SERVICES:3049016}  Discharge Criteria: Patient will be discharged from [facility] if patient refuses treatment 3 consecutive times without medical reason, if treatment goals not met, if there is a change in medical status, if patient makes no progress towards goals or if patient is discharged from hospital.  The above assessment, treatment plan, treatment alternatives and goals were discussed and mutually agreed upon: {Assessment/Treatment Plan Discussed/Agreed:3049017}  Stacy Moran  [facility] 04/22/2020, 4:22 PM

## 2020-04-22 NOTE — Progress Notes (Signed)
CBG checks in AM daily and PRN per PA.

## 2020-04-22 NOTE — Progress Notes (Signed)
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  11/4, pt s/p L far-lateral craniotomy for clipping of PICA aneurysm. 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 12/4 pt with desat and bradycardia and was transferred to ICU and placed on the vent. Pt off vent 12/6. 12/15 seizure prompting MRI showing worsening hydrocephalus. 12/16 transferred to ICU for bedside ventriculostomy. Pt is now s/p VP shunt placement on 12/21.    PT Comments    Pt making great progress with mobility, ambulating short hallway distance with mod assist for truncal support, guiding pt trajectory, and stability especially with fatigue. PT transitioned pt from RW to [facility] during gait today secondary to pt having difficulty staying within and navigating RW. Pt with multiple bouts of coughing and secretions during mobility, PT assisting with external suction as appropriate. Pt endorses feeling depressed, but is hopeful now that she has a CIR consult. Will continue to follow.     Follow Up Recommendations  Supervision/Assistance - 24 hour;CIR     Equipment Recommendations  Wheelchair (measurements PT);Wheelchair cushion (measurements PT);3in1 (PT);Other (comment)    Recommendations for Other Services       Precautions / Restrictions Precautions Precautions: Fall Precaution Comments: trach 28%/5L - taken off humidified RA for mobility Required Braces or Orthoses: Other Brace Other Brace: abdominal binder - for PEG    Mobility  Bed Mobility Overal bed mobility: Needs Assistance             General bed mobility comments: up in recliner upon PT arrival to  room, requests stay in recliner  Transfers Overall transfer level: Needs assistance Equipment used: Rolling walker (2 wheeled);1 person hand held assist Transfers: Sit to/from Stand Sit to Stand: Mod assist         General transfer comment: Mod assist for power up, rise, and steady, first stand with RW and second with HHA  Ambulation/Gait Ambulation/Gait assistance: Mod assist Gait Distance (Feet): 45 Feet (+15) Assistive device: Rolling walker (2 wheeled);1 person hand held assist Gait Pattern/deviations: Step-through pattern;Decreased stride length;Trunk flexed;Ataxic Gait velocity: decr   General Gait Details: Mod assist for truncal support, initially guiding RW but transitioned to [facility] and supporting pt's waist, weight shifting, and bodyweight support with fatigue. Seated rest break x3 minutes after 45-ft gait bout   Stairs             Wheelchair Mobility    Modified Rankin (Stroke Patients Only) Modified Rankin (Stroke Patients Only) Pre-Morbid Rankin Score: No symptoms Modified Rankin: Moderately severe disability     Balance Overall balance assessment: Needs assistance Sitting-balance support: Single extremity supported;Feet supported Sitting balance-Leahy Scale: Fair Sitting balance - Comments: able to sit edge of chair without difficulty, without back support   Standing balance support: Bilateral upper extremity supported Standing balance-Leahy Scale: Poor Standing balance comment: reliant on external assist                            Cognition Arousal/Alertness: Awake/alert Behavior During Therapy: Flat affect;Impulsive Overall Cognitive Status: Impaired/Different from baseline Area of Impairment: Attention;Memory;Following commands  Current Attention Level: Selective Memory: Decreased recall of precautions Following Commands: Follows one step commands with increased time Safety/Judgement: Decreased awareness of  safety;Decreased awareness of deficits Awareness: Intellectual Problem Solving: Difficulty sequencing;Slow processing;Requires tactile cues;Requires verbal cues General Comments: Pt motivated during therapy, does require cues to wait for PT assist as pt can be impulsive at times.      Exercises      General Comments        Pertinent Vitals/Pain Pain Assessment: Faces Faces Pain Scale: Hurts little more Pain Location: thighs Pain Descriptors / Indicators: Sore Pain Intervention(s): Limited activity within patient's tolerance;Monitored during session;Repositioned    Home Living                      Prior Function            PT Goals (current goals can now be found in the care plan section) Acute Rehab PT Goals Patient Stated Goal: none stated PT Goal Formulation: With patient Time For Goal Achievement: 05/05/20 Potential to Achieve Goals: Fair Progress towards PT goals: Progressing toward goals    Frequency    Min 3X/week      PT Plan Current plan remains appropriate    Co-evaluation              AM-PAC PT "6 Clicks" Mobility   Outcome Measure  Help needed turning from your back to your side while in a flat bed without using bedrails?: A Little Help needed moving from lying on your back to sitting on the side of a flat bed without using bedrails?: A Little Help needed moving to and from a bed to a chair (including a wheelchair)?: A Lot Help needed standing up from a chair using your arms (e.g., wheelchair or bedside chair)?: A Lot Help needed to walk in hospital room?: A Lot Help needed climbing 3-5 steps with a railing? : A Lot 6 Click Score: 14    End of Session Equipment Utilized During Treatment: Gait belt Activity Tolerance: Patient tolerated treatment well Patient left: with call bell/phone within reach;with restraints reapplied;in chair;with chair alarm set (waist belt restraint, applied in seated position in recliner) Nurse  Communication: Mobility status PT Visit Diagnosis: Other abnormalities of gait and mobility (R26.89);Other symptoms and signs involving the nervous system (R29.898);Difficulty in walking, not elsewhere classified (R26.2) Hemiplegia - caused by: Other Nontraumatic intracranial hemorrhage     Time: 1218-1241 PT Time Calculation (min) (ACUTE ONLY): 23 min  Charges:  $Gait Training: 8-22 mins $Therapeutic Activity: 8-22 mins                     [name], PT Acute Rehabilitation Services Pager [phone]  Office [phone]    [place] 04/22/2020, 1:19 PM

## 2020-04-22 NOTE — Progress Notes (Signed)
Physical Medicine and Rehabilitation Consult Reason for Consult: Sudden onset of headache with decreased functional mobility Referring Physician: Dr. Kathyrn Sheriff  HPI: Hema Lanza is a 65 y.o. right-handed female with unremarkable past medical history on no prescription medications.  History taken from chart review due to nonverbal.  She presented on 01/30/2020 with acute headache progressively worsening with obtundation requiring intubation in the ED for airway protection.  Noted blood pressure 262/123 .  Admission chemistries unremarkable except potassium 3.4, glucose 178, hemoglobin A1c 6.2, alcohol negative, urine drug screen negative.  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  Patient underwent right frontal ventriculostomy on 02/26/2020 per Dr. Kathyrn Sheriff.  She then underwent diagnostic cerebral angiogram for further delineating the left PICA aneurysm.  It was noted not to be amenable to endovascular treatment and underwent suboccipital craniotomy for clipping of PICA aneurysm 01/31/2020.  Ultrasound soft tissue head and neck showed probable right thyroid nodule 2.3 x 2.3 x 2.4 cm.  Hospital course further complicated by prolonged vent support requiring tracheostomy as well as thyroidectomy on 02/08/2020 per Dr. Constance Holster and currently has a #6 cuffless flexible Shiley tracheostomy tube in place.  Patient currently remains n.p.o. with alternative means of nutritional support.  Repeat head CT on 11/19 personally reviewed-improving. She was cleared to begin subcutaneous heparin for DVT prophylaxis.  Patient is completing a 21-day course of Nimotop with close monitoring of blood pressure.  Therapy evaluations  completed with recommendations of physical medicine rehab consult.   Review of Systems  Unable to perform ROS: Other  Trach without PMV  History reviewed. No pertinent past medical history.  Unable to obtain from patient.      Past Surgical History:  Procedure Laterality Date  . CRANIOTOMY Left 01/30/2020   Procedure: LEFT FAR LATERAL CRANIOTOMY FOR ANEURYSM CLIPPING;  Surgeon: [name], MD;  Location: [place];  Service: Neurosurgery;  Laterality: Left;  . IR ANGIO INTRA EXTRACRAN SEL INTERNAL CAROTID BILAT MOD SED  01/30/2020  . IR ANGIO VERTEBRAL SEL VERTEBRAL UNI L MOD SED  01/30/2020  . RADIOLOGY WITH ANESTHESIA N/A 01/30/2020   Procedure: IR WITH ANESTHESIA;  Surgeon: [name], MD;  Location: [place];  Service: Radiology;  Laterality: N/A;  . THYROIDECTOMY N/A 02/08/2020   Procedure: THYROIDECTOMY;  Surgeon: [name], MD;  Location: [place];  Service: ENT;  Laterality: N/A;  . TRACHEOSTOMY TUBE PLACEMENT N/A 02/08/2020   Procedure: TRACHEOSTOMY;  Surgeon: [name], MD;  Location: [place];  Service: ENT;  Laterality: N/A;   History reviewed. No pertinent family history.  Unable to obtain from patient. Social History:  has no history on file for tobacco use, alcohol use, and drug use.  Unable to obtain from patient. Allergies: No Known Allergies No medications prior to admission.    Home: Home Living Family/patient expects to be discharged to:: Unsure Additional Comments: No family present to get info.  Pt unable.  Functional History: Prior Function Comments: No family present to gather information, pt unable to participate, no information in the chart. Functional Status:  Mobility: Bed Mobility Overal bed mobility: Needs Assistance Bed Mobility: Rolling, Sidelying to Sit, Sit to Sidelying Rolling: Mod assist, +2 for physical assistance Sidelying to sit: Mod assist, +2  for physical assistance, +2 for safety/equipment Supine to sit: +2 for  physical assistance, Total assist Sit to supine: +2 for physical assistance, Mod assist, +2 for safety/equipment General bed mobility comments: Rolling both sides with assist to position extremeties to assist, required increased assist to roll R compared to L;  For supine/sidelying/sit: requiring assist for trunk and L LE, cues for sequencing and assist to control trunk Transfers Overall transfer level: Needs assistance Equipment used: 2 person hand held assist Transfers: Sit to/from Stand Sit to Stand: Max assist, +2 physical assistance General transfer comment: did not attempt - pt impulsive and not following commands as well today, unsafe to stand Ambulation/Gait General Gait Details: NT  ADL: ADL Overall ADL's : Needs assistance/impaired Lower Body Dressing: Maximal assistance, +2 for physical assistance, +2 for safety/equipment, Sit to/from stand Lower Body Dressing Details (indicate cue type and reason): pt reaching towards socks to pull higher up on her feet Functional mobility during ADLs: Maximal assistance, +2 for physical assistance, +2 for safety/equipment General ADL Comments: max-totalA currently  Cognition: Cognition Overall Cognitive Status: Impaired/Different from baseline Orientation Level: Oriented to person Cognition Arousal/Alertness: Awake/alert Behavior During Therapy: Impulsive Overall Cognitive Status: Impaired/Different from baseline Area of Impairment: Orientation, Attention, Memory, Following commands, Safety/judgement, Problem solving, Awareness Orientation Level: Disoriented to, Time, Situation Current Attention Level: Sustained Memory: Decreased short-term memory Following Commands: Follows one step commands inconsistently Safety/Judgement: Decreased awareness of deficits, Decreased awareness of safety Awareness: Intellectual Problem Solving: Requires verbal cues, Requires tactile cues, Difficulty sequencing General Comments: Pt did not want to  communicate with writing today.  She continues to move quickly and impulsively at times.  Required increased cues for transfers and decreased consistency with commands. Difficult to assess due to: Tracheostomy  Blood pressure (!) 163/84, pulse 86, temperature 98.4 F (36.9 C), temperature source Oral, resp. rate (!) 21, height 5\' 9"  (1.753 m), weight 59.7 kg, SpO2 94 %. Physical Exam Vitals reviewed.  Constitutional:      General: She is not in acute distress. HENT:     Head: Normocephalic.     Comments: + NG. Surgical site CDI    Right Ear: External ear normal.     Left Ear: External ear normal.     Nose: Nose normal.  Eyes:     General:        Right eye: No discharge.        Left eye: No discharge.     Comments: No muscle movement noted in left eye with eyelid retraction  Neck:     Comments: + Trach with thick secretions Cardiovascular:     Rate and Rhythm: Normal rate and regular rhythm.  Pulmonary:     Effort: Pulmonary effort is normal.     Breath sounds: Rhonchi present.  Abdominal:     General: There is distension.     Comments: Bowel sounds slowed.  Musculoskeletal:     Comments: No edema or tenderness in extremities  Skin:    General: Skin is warm and dry.  Neurological:     Mental Status: She is alert.     Comments: Alert Follows simple commands. Motor:?  4+/5 throughout  Psychiatric:     Comments: Unable to assess due to inability to speak     Lab Results Last 24 Hours       Results for orders placed or performed during the hospital encounter of 01/30/20 (from the past 24 hour(s))  Glucose, capillary     Status: Abnormal  Collection Time: 02/14/20  5:55 AM  Result Value Ref Range   Glucose-Capillary 101 (H) 70 - 99 mg/dL  Glucose, capillary     Status: None   Collection Time: 02/14/20  8:19 AM  Result Value Ref Range   Glucose-Capillary 71 70 - 99 mg/dL  Glucose, capillary     Status: Abnormal   Collection Time: 02/14/20 11:54 AM  Result  Value Ref Range   Glucose-Capillary 58 (L) 70 - 99 mg/dL  Glucose, capillary     Status: None   Collection Time: 02/14/20  4:19 PM  Result Value Ref Range   Glucose-Capillary 85 70 - 99 mg/dL  Glucose, capillary     Status: Abnormal   Collection Time: 02/14/20  8:09 PM  Result Value Ref Range   Glucose-Capillary 127 (H) 70 - 99 mg/dL  Glucose, capillary     Status: Abnormal   Collection Time: 02/14/20 11:04 PM  Result Value Ref Range   Glucose-Capillary 125 (H) 70 - 99 mg/dL  Glucose, capillary     Status: None   Collection Time: 02/15/20  4:03 AM  Result Value Ref Range   Glucose-Capillary 98 70 - 99 mg/dL   Comment 1 Notify RN    Comment 2 Document in Chart       Imaging Results (Last 48 hours)  CT HEAD WO CONTRAST  Result Date: 02/15/2020 CLINICAL DATA:  Headache.  Intracranial hemorrhage suspected EXAM: CT HEAD WITHOUT CONTRAST TECHNIQUE: Contiguous axial images were obtained from the base of the skull through the vertex without intravenous contrast. COMPARISON:  Three days ago FINDINGS: Brain: Subdural hemorrhage along the right cerebral convexity which has not clearly increased from before, 7 mm in maximal thickness. Intraventricular hemorrhage is no longer seen. The EVD has been removed with stable ventricular volume. Small volume subarachnoid hemorrhage along the left sylvian fissure primarily. No evidence of recurrent hemorrhage. Remote right basal ganglia perforator infarct. Vascular: Left PICA aneurysm clipping. Skull: Unremarkable left suboccipital craniotomy. Fluid collection superficial to the craniotomy and C1 ring resection is non progressed. Sinuses/Orbits: Left enucleation. IMPRESSION: 1. No recurrent hemorrhage or other acute finding. 2. Decreasing subarachnoid/intraventricular hemorrhage. 3. Subdural hematoma on the right which appears slightly fuller but measures similar to before. 4. Stable ventricular volume after EVD removal. Electronically Signed    By: Monte Fantasia M.D.   On: 02/15/2020 04:56   DG Chest Port 1 View  Result Date: 02/14/2020 CLINICAL DATA:  Follow-up examination for acute respiratory failure. EXAM: PORTABLE CHEST 1 VIEW COMPARISON:  Prior radiograph from 02/10/2020. FINDINGS: Tracheostomy tube in place overlying the upper airway, tip well above the carina. Dobbhoff feeding tube in place with tip overlying the stomach. Cardiomegaly, stable. Mediastinal silhouette within normal limits. Lungs well inflated. Mild streaky right basilar opacity, most consistent with atelectasis. No other focal airspace disease. No pulmonary edema or visible pleural effusion. No pneumothorax. No acute osseous finding. IMPRESSION: 1. Support apparatus in satisfactory position. 2. Mild right basilar subsegmental atelectasis. No other active cardiopulmonary disease. Electronically Signed   By: Jeannine Boga M.D.   On: 02/14/2020 02:49     Assessment/Plan: Diagnosis: SAH Stroke: Continue secondary stroke prophylaxis and Risk Factor Modification listed below:   Blood Pressure Management:  Continue current medication with prn's with permisive HTN per primary team Prediabetes management:   Labs and imaging (see above) independently reviewed.  Records reviewed and summated above.  1. Does the need for close, 24 hr/day medical supervision in concert with the patient's rehab needs make  it unreasonable for this patient to be served in a less intensive setting? Yes 2. Co-Morbidities requiring supervision/potential complications: tachypnea (monitor RR and O2 Sats with increased physical exertion), prediabetes (Monitor in accordance with exercise and adjust meds as necessary), hypokalemia (continue to monitor and replete as necessary), leukocytosis (repeat labs, cont to monitor for signs and symptoms of infection, further workup if indicated),?  Ileus (previous KUB personally reviewed, consider repeat-hold tube feeds and allow for bowel rest,  aggressively replete potassium) 3. Due to bladder management, bowel management, safety, skin/wound care, disease management, medication administration, pain management and patient education, does the patient require 24 hr/day rehab nursing? Yes 4. Does the patient require coordinated care of a physician, rehab nurse, therapy disciplines of PT/OT/SLP to address physical and functional deficits in the context of the above medical diagnosis(es)? Yes Addressing deficits in the following areas: balance, endurance, locomotion, transferring, bowel/bladder control, bathing, dressing, toileting, cognition, speech, swallowing and psychosocial support 5. Can the patient actively participate in an intensive therapy program of at least 3 hrs of therapy per day at least 5 days per week? Yes 6. The potential for patient to make measurable gains while on inpatient rehab is excellent 7. Anticipated functional outcomes upon discharge from inpatient rehab are supervision and min assist  with PT, supervision and min assist with OT, supervision and min assist with SLP. 8. Estimated rehab length of stay to reach the above functional goals is: 22-27 days days. 9. Anticipated discharge destination: TBD 10. Overall Rehab/Functional Prognosis: good  RECOMMENDATIONS: This patient's condition is appropriate for continued rehabilitative care in the following setting: CIR caregiver support available upon discharge. Patient has agreed to participate in recommended program. Potentially Note that insurance prior authorization may be required for reimbursement for recommended care.  Comment: Rehab Admissions Coordinator to follow up.  I have personally performed a face to face diagnostic evaluation, including, but not limited to relevant history and physical exam findings, of this patient and developed relevant assessment and plan.  Additionally, I have reviewed and concur with the physician assistant's documentation above.    Delice Lesch, MD, ABPMR [name] Angiulli, PA-C 02/15/2020

## 2020-04-22 NOTE — Progress Notes (Signed)
  Speech Language Pathology Treatment: Dysphagia;Jasper Speaking valve  Patient Details Name: Stacy Moran MRN: [id number] DOB: [birth year] Today's Date: 04/22/2020 Time: 1308-6578 SLP Time Calculation (min) (ACUTE ONLY): 16 min  Assessment / Plan / Recommendation Clinical Impression  Patient calm and alert upon entrance to room, nodding head appropriately to clinician. PMV placed and patient able to tolerate for 15 minutes without evidence of distress or change in vital signs. Patient able to adequately exchange air however she continues to be aphonic with wet upper airway congestion that she was unable to clear given inability to consistently follow commands. 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 Patient able to complete self oral care with set up assistance. Patient able to self feed trials of ice chips with moderate tactile, visual, and verbal cueing. Immediate wet vocal quality, increase in loud upper airway congestion, and coughing noted post swallow indicative of decreased airway protection. PMV removed following session, requested RN suctioning. Patient continues to benefit from SLP services for cognition, speech and swallowing function.    HPI HPI: 65 y/o presented with large subarachnoid hemorrhage due to ruptured PICA with resultant hydrocephalus.  Craniotomy 11/4, vented until 11/12 when pt was trached. Found to have multinodular goiter complicating respiratory failure so total thyroidectomy also completed on 11/12. Pt had been unable to functionaly utilize PMV due to copious secretions with inability to mobilize and severity of "pre stenosis" limiteds airflow severely. Direct laryngoscopy with ENT revealed "the cords were thickened and swollen; significant subglottic swelling especially on the  left side and posteriorly; there definitely seem to be movement of the right cord; left cord may be paretic but it is a little hard to tell.  appeared to be an immature stenosis. MBS 11/23 profound dysphagia with abnormal anatomy. Repeat MBS 12/1 minimal improvement- decr edema however significant subglottic edema/stenosis narrowing subglottic space. Bolus stops at UES with minimal opening with aspiration during/post swalow- continue NPO. Repeat MBS today for possible improvements.      SLP Plan  Continue with current plan of care       Recommendations  Diet recommendations: NPO Medication Administration: Via alternative means      Patient may use Passy-Muir Speech Valve: with SLP only PMSV Supervision: Full         Oral Care Recommendations: Oral care QID Follow up Recommendations: Inpatient Rehab Plan: Continue with current plan of care       GO              [facility] MA, [place] 04/22/2020, 12:15 PM

## 2020-04-23 ENCOUNTER — Inpatient Hospital Stay (HOSPITAL_COMMUNITY): Payer: Self-pay

## 2020-04-23 ENCOUNTER — Inpatient Hospital Stay (HOSPITAL_COMMUNITY): Payer: Self-pay | Admitting: Occupational Therapy

## 2020-04-23 DIAGNOSIS — Z930 Tracheostomy status: Secondary | ICD-10-CM

## 2020-04-23 DIAGNOSIS — Z931 Gastrostomy status: Secondary | ICD-10-CM

## 2020-04-23 DIAGNOSIS — R1310 Dysphagia, unspecified: Secondary | ICD-10-CM

## 2020-04-23 DIAGNOSIS — I10 Essential (primary) hypertension: Secondary | ICD-10-CM

## 2020-04-23 LAB — CBC WITH DIFFERENTIAL/PLATELET
Abs Immature Granulocytes: 0.02 10*3/uL (ref 0.00–0.07)
Basophils Absolute: 0 10*3/uL (ref 0.0–0.1)
Basophils Relative: 0 %
Eosinophils Absolute: 0 10*3/uL (ref 0.0–0.5)
Eosinophils Relative: 1 %
HCT: 39.6 % (ref 36.0–46.0)
Hemoglobin: 12 g/dL (ref 12.0–15.0)
Immature Granulocytes: 0 %
Lymphocytes Relative: 30 %
Lymphs Abs: 1.6 10*3/uL (ref 0.7–4.0)
MCH: 26.7 pg (ref 26.0–34.0)
MCHC: 30.3 g/dL (ref 30.0–36.0)
MCV: 88.2 fL (ref 80.0–100.0)
Monocytes Absolute: 0.5 10*3/uL (ref 0.1–1.0)
Monocytes Relative: 9 %
Neutro Abs: 3.3 10*3/uL (ref 1.7–7.7)
Neutrophils Relative %: 60 %
Platelets: 177 10*3/uL (ref 150–400)
RBC: 4.49 MIL/uL (ref 3.87–5.11)
RDW: 15.4 % (ref 11.5–15.5)
WBC: 5.4 10*3/uL (ref 4.0–10.5)
nRBC: 0 % (ref 0.0–0.2)

## 2020-04-23 LAB — COMPREHENSIVE METABOLIC PANEL WITH GFR
ALT: 16 U/L (ref 0–44)
AST: 12 U/L — ABNORMAL LOW (ref 15–41)
Albumin: 3.8 g/dL (ref 3.5–5.0)
Alkaline Phosphatase: 74 U/L (ref 38–126)
Anion gap: 8 (ref 5–15)
BUN: 28 mg/dL — ABNORMAL HIGH (ref 8–23)
CO2: 33 mmol/L — ABNORMAL HIGH (ref 22–32)
Calcium: 10.2 mg/dL (ref 8.9–10.3)
Chloride: 97 mmol/L — ABNORMAL LOW (ref 98–111)
Creatinine, Ser: 0.75 mg/dL (ref 0.44–1.00)
GFR, Estimated: >60 mL/min (ref >60)
Glucose, Bld: 123 mg/dL — ABNORMAL HIGH (ref 70–99)
Potassium: 4.8 mmol/L (ref 3.5–5.1)
Sodium: 138 mmol/L (ref 135–145)
Total Bilirubin: 0.5 mg/dL (ref 0.3–1.2)
Total Protein: 7.6 g/dL (ref 6.5–8.1)

## 2020-04-23 LAB — GLUCOSE, CAPILLARY
Glucose-Capillary: 119 mg/dL — ABNORMAL HIGH (ref 70–99)
Glucose-Capillary: 122 mg/dL — ABNORMAL HIGH (ref 70–99)
Glucose-Capillary: 142 mg/dL — ABNORMAL HIGH (ref 70–99)
Glucose-Capillary: 142 mg/dL — ABNORMAL HIGH (ref 70–99)
Glucose-Capillary: 145 mg/dL — ABNORMAL HIGH (ref 70–99)
Glucose-Capillary: 155 mg/dL — ABNORMAL HIGH (ref 70–99)
Glucose-Capillary: 94 mg/dL (ref 70–99)

## 2020-04-23 MED ORDER — OSMOLITE 1.2 CAL PO LIQD
1000.0000 mL | ORAL | Status: DC
  Administered 2020-04-23 – 2020-05-06 (×14): 1000 mL
  Filled 2020-04-23 (×32): qty 1000

## 2020-04-23 NOTE — Patient Care Conference (Signed)
Inpatient RehabilitationTeam Conference and Plan of Care Update Date: 04/23/2020   Time: 11:07 AM    Patient Name: Stacy Moran      Medical Record Number: [id number]  Date of Birth: 1955-09-24 Sex: Female         Room/Bed: 4M01C/4M01C-01 Payor Info: Payor: MEDICAID POTENTIAL / Plan: MEDICAID POTENTIAL / Product Type: *No Product type* /    Admit Date/Time:  04/22/2020  2:29 PM  Primary Diagnosis:  ICH (intracerebral hemorrhage) [facility])  Hospital Problems: Principal Problem:   ICH (intracerebral hemorrhage) ([place]) Active Problems:   Benign essential HTN   Dysphagia   Status post tracheostomy ([place])   S/P percutaneous endoscopic gastrostomy (PEG) tube placement [facility])    Expected Discharge Date: Expected Discharge Date:  (Initial evals incomplete; WLOS 2-3 weeks)  Team Members Present: Physician leading conference: Dr. Delice Lesch Care Coodinator Present: Dorien Chihuahua, RN, BSN, CRRN;Becky Dupree, LCSW Nurse Present: Frances Maywood, RN PT Present: Magda Kiel, PT OT Present: Leretha Pol, OT SLP Present: Charolett Bumpers, SLP PPS Coordinator present : Gunnar Fusi, SLP     Current Status/Progress Goal Weekly Team Focus  Bowel/Bladder             Swallow/Nutrition/ Hydration   pending  Pending      ADL's             Mobility             Communication   Pending  Pending      Safety/Cognition/ Behavioral Observations  Pending  Pending      Pain             Skin               Discharge Planning:  New evaluation today-goal hopefully is home with daughter and her family but they work, so wil need to hire assist or have another family member with her. Pt is uninsured   Team Discussion: Trach with PMSV trials limited by secretions. PEG for nutritional means. Appears receptive aphasia is better than expressive. Patient is unable to communicate thoughts and pushing therapy away at times.  Patient on target to meet rehab goals: Initial evals pending however able to  transfer with [facility] [place] and progress notes for long and short-term goals.   Revisions to Treatment Plan:   Teaching Needs: Trach care and management if unable to wean, PEG care and medication management, transfers, toileting, supervision for safety and cues,  Current Barriers to Discharge: Decreased caregiver support, Home enviroment access/layout, Therapist, nutritional, Insurance for SNF coverage, Behavior, Nutritional means and New oxygen and daughter works  Presenter, broadcasting to Barriers: Family education Recommend hired caregivers to support daughter and provide supervision when daughter is at work.      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 and bur hole evacuation of chronic subdural hematoma 03/18/2020  Barriers to Discharge: Behavior;Medical stability;New oxygen;Decreased family/caregiver support   Possible Resolutions to [employer] Focus: Therapies, monitor Trach/PEG, follow labs - Hb   Continued Need for Acute Rehabilitation Level of Care: The patient requires daily medical management by a physician with specialized training in physical medicine and rehabilitation for the following reasons: Direction of a multidisciplinary physical rehabilitation program to maximize functional independence : Yes Medical management of patient stability for increased activity during participation in an intensive  rehabilitation regime.: Yes Analysis of laboratory values and/or radiology reports with any subsequent need for medication adjustment and/or medical intervention. : Yes   I attest that I was present, lead the team conference, and concur with the assessment and plan of the team.   Dorien Chihuahua B 04/23/2020, 3:59 PM

## 2020-04-23 NOTE — Progress Notes (Signed)
Occupational Therapy Note  Patient Details  Name: Stacy Moran MRN: [id number] Date of Birth: [birth year]  Today's Date: 04/23/2020 OT Missed Time: 72 Minutes Missed Time Reason: Patient fatigue  Pt semi-upright in bed, sleeping.  OT unable to arouse pt despite providing external cues and applying wet washcloth to face to increase pt alertness.  When OT asked pt if she was too tired to participate, pt nodded head "yes".  Call bell in reach, bed alarm on.   Caryl Asp Shermar Friedland 04/23/2020, 3:04 PM

## 2020-04-23 NOTE — Evaluation (Signed)
Speech Language Pathology Assessment and Plan  Patient Details  Name: Stacy Moran MRN: [id number] Date of Birth: [birth year]  SLP Diagnosis: Speech and Language deficits;Cognitive Impairments;Dysphagia;Voice disorder  Rehab Potential: Fair ELOS: 3-4 weeks    Today's Date: 04/23/2020 SLP Individual Time: 1200-1230, 1003-1030  SLP Individual Time Calculation (min): 30 min and 27 min   Hospital Problem: Principal Problem:   ICH (intracerebral hemorrhage) (H[facility]) Active Problems:   Benign essential HTN   Dysphagia   Status post tracheostomy ([place])   S/P percutaneous endoscopic gastrostomy (PEG) tube placement [facility])  Past Medical History: History reviewed. No pertinent past medical history. Past Surgical History:  Past Surgical History:  Procedure Laterality Date  . BURR HOLE N/A 03/18/2020   Procedure: Haskell Flirt;  Surgeon: [name], MD;  Location: [place];  Service: Neurosurgery;  Laterality: N/A;  . CRANIOTOMY Left 01/30/2020   Procedure: LEFT FAR LATERAL CRANIOTOMY FOR ANEURYSM CLIPPING;  Surgeon: [name], MD;  Location: [place];  Service: Neurosurgery;  Laterality: Left;  . DIRECT LARYNGOSCOPY N/A 02/29/2020   Procedure: DIRECT LARYNGOSCOPY;  Surgeon: [name], MD;  Location: [place];  Service: ENT;  Laterality: N/A;  . IR ANGIO INTRA EXTRACRAN SEL INTERNAL CAROTID BILAT MOD SED  01/30/2020  . IR ANGIO VERTEBRAL SEL VERTEBRAL UNI L MOD SED  01/30/2020  . IR GASTROSTOMY TUBE MOD SED  02/22/2020  . LAPAROSCOPIC REVISION VENTRICULAR-PERITONEAL (V-P) SHUNT N/A 03/18/2020   Procedure: LAPAROSCOPIC REVISION VENTRICULAR-PERITONEAL (V-P) SHUNT;  Surgeon: [name], MD;  Location: [place];  Service: General;  Laterality: N/A;  . RADIOLOGY WITH ANESTHESIA N/A 01/30/2020   Procedure: IR WITH ANESTHESIA;  Surgeon: [name], MD;  Location: [place];  Service: Radiology;  Laterality: N/A;  . THYROIDECTOMY N/A 02/08/2020   Procedure: THYROIDECTOMY;  Surgeon: [name], MD;  Location: [place];  Service: ENT;  Laterality: N/A;  . TRACHEOSTOMY TUBE PLACEMENT N/A 02/08/2020   Procedure: TRACHEOSTOMY;  Surgeon: [name], MD;  Location: [place];  Service: ENT;  Laterality: N/A;  . TRACHEOSTOMY TUBE PLACEMENT N/A 02/29/2020   Procedure: TRACHEOSTOMY EXCHANGE;  Surgeon: [name], MD;  Location: [place];  Service: ENT;  Laterality: N/A;  . VENTRICULOPERITONEAL SHUNT N/A 03/18/2020   Procedure: RIGHT SHUNT INSERTION VENTRICULAR-PERITONEAL/ BURR HOLE Evacuation of Subdural Hematoma;  Surgeon: [name], MD;  Location: [place];  Service: Neurosurgery;  Laterality: N/A;    Assessment / Plan / Recommendation Clinical Impression Patient is a 65 y.o. year old female with unremarkable past medical history on no prescription medications. History taken from chart review due to patient being somnolent. Presented 01/30/2020 with acute headache progressively worsening with obtundation requiring intubation in the ED for airway protection. Noted blood pressure to 62/123. Admission chemistries unremarkable except potassium 3.4 glucose 178 hemoglobin A1c 6.2 alcohol negative urine drug screen negative. CT of the head demonstrated primarily posterior fossa SAH around the foramen magnum and perimedullary cistern with casting of the fourth ventricle and extension into the third and lateral ventricles and associated obstructive HCP. 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 Patient underwent right frontal ventriculostomy 01/30/2020 per Dr. Kathyrn Sheriff she did underwent diagnostic cerebral angiogram for further delineating left PICA aneurysm. It was noted not to be amenable to endovascular treatment underwent suboccipital craniotomy for clipping of PICA aneurysm 01/31/2020. Ultrasound soft tissue head and neck showed probable right thyroid nodule 2.3 x 2.3 x 2.4 cm. Hospital course further complicated by  prolonged ventilatory support requiring tracheostomy as well as thyroidectomy on 02/08/2020 per Dr. Constance Moran and maintained on #6 cuffless flexible Shiley tracheostomy tube. Patient remained n.p.o. with alternative means of nutritional support with gastrostomy tube placed 02/22/2020 per interventional radiology Dr. Earleen Newport. She was cleared to begin subcutaneous heparin for DVT prophylaxis. She can pleaded a 63-day course of Nimotop for blood pressure control. Palliative care has been consulted to establish goals of care. Patient was weaned from her external ventricular drain appear to be neurologically stable but progressively became more somnolent with repeat scan imaging demonstrated ventriculomegaly consistent with delayed onset of hydrocephalus. EEG was negative for seizure. 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 Kathyrn Sheriff. Therapy evaluations were completed due to patient's decrease in functional ability cognitive deficits multiple medical issues she was admitted for a comprehensive rehab program.  Pt's evaluation was very limited by reduced alertness, inability to open eyes more than 30 seconds despite max tactile/verbal cues. Pt was able to tolerate PMSV for 15 minutes (intermittently checked at 5-10 intervals, no air trapping noted) with oxygen ranging from 100-98 and HR 80-84. SLP recommends PMSV with full supervision of SLP to assess longer tolerance of time and with staff in 15 minute intervals due to concern of reduced alertness. Pt appears primarily aphonic with x1 voicing noted when directed to express "hey" with wet congestive vocal quality. Pt was able to respond to some yes/no question via mouthing at word level and facial expression . Pt was able to write initial letter of name and initial letter of "cat", but unable to copy/trace due  to reduced attention (eyes closed.) SLP was able to complete a limited BSE, providing less than thorough oral care, pt consumed x2 ice chips and x1 TSP of puree with PMSV in place. Pt demonstrated piecemeal swallow and immediate throat clear x1 ice chips and on all consistencies delayed wet cough. Pt was unable to expel mucus secretions from oral cavity and SLP administered suction to trach hub to remove mildly thick secretions. SLP recommends to continue NPO status, PO trials with ST at bedside prior to repeat MBS when cognition improves and continued cognitive linguistic assessment due to limitations from reduced participation/lethargy. Pt would benefit from skilled ST services in order to maximize functional independence and reduce burden of care, requiring 24 hour supervision at discharge with continued skilled ST services.   Skilled Therapeutic Interventions          Evaluation limited by lethargy and participation, SLP will continue assessment of swallow function, speech, PMSV tolerance and cognitive-linguistic skills.  Pt was left in room with call bell within reach and bed alarm set. SLP recommends to continue skilled services.   SLP Assessment  Patient will need skilled Speech Lanaguage Pathology Services during CIR admission    Recommendations  Patient may use Passy-Muir Speech Valve: with SLP only;During all therapies with supervision (during therapy other than ST in 15 minute intervals) PMSV Supervision: Full MD: Please consider changing trach tube to : Cuffless SLP Diet Recommendations: NPO;Alternative means - long-term Medication Administration: Via alternative means Oral Care Recommendations: Oral care QID Patient destination: Home Follow up Recommendations: Home Health SLP;24 hour supervision/assistance;Skilled Nursing facility;Outpatient SLP Equipment Recommended: None recommended by SLP    SLP Frequency 3 to 5 out of 7 days   SLP Duration  SLP Intensity  SLP  Treatment/Interventions 3-4 weeks  Minumum of  1-2 x/day, 30 to 90 minutes  Cognitive remediation/compensation;Cueing hierarchy;Dysphagia/aspiration precaution training;Functional tasks;Internal/external aids;Speech/Language facilitation;Patient/family education    Pain Pain Assessment Pain Scale: 0-10 Pain Score: 0-No pain  Prior Functioning Cognitive/Linguistic Baseline: Within functional limits (all from chart) Type of Home: Apartment  Lives With:  (all info from chart) Available Help at Discharge: Family;Available PRN/intermittently Vocation: Full time employment  SLP Evaluation Cognition Overall Cognitive Status: Impaired/Different from baseline (all from chart) Arousal/Alertness: Lethargic Orientation Level:  (unable to assess due to lethargy) Attention: Focused Focused Attention: Impaired Focused Attention Impairment: Verbal basic;Functional basic Sustained Attention: Impaired Sustained Attention Impairment: Functional basic Memory: Impaired Memory Impairment:  (unable to assess due to lethargy) Decreased Short Term Memory: Verbal basic Immediate Memory Recall: Sock;Blue;Bed Memory Recall Sock: Not able to recall Memory Recall Blue: Not able to recall Memory Recall Bed: Not able to recall Awareness: Impaired Awareness Impairment: Intellectual impairment Problem Solving: Impaired Problem Solving Impairment: Functional basic Safety/Judgment: Impaired  Comprehension Auditory Comprehension Overall Auditory Comprehension: Impaired Yes/No Questions: Impaired Basic Biographical Questions: 26-50% accurate Basic Immediate Environment Questions: 25-49% accurate Commands: Impaired One Step Basic Commands: 0-24% accurate Visual Recognition/Discrimination Discrimination: Not tested Reading Comprehension Reading Status: Not tested Expression Expression Primary Mode of Expression: Other (comment) Verbal Expression Overall Verbal Expression: Other (comment) Initiation:  Impaired Interfering Components: Attention Written Expression Dominant Hand: Right Oral Motor Oral Motor/Sensory Function Overall Oral Motor/Sensory Function: Mild impairment (lmited assessment due to lethargy) Facial Symmetry: Within Functional Limits Motor Speech Overall Motor Speech: Impaired Phonation: Breathy;Wet Intelligibility: Intelligibility reduced Word: 0-24% accurate  Care Tool Care Tool Cognition Expression of Ideas and Wants Expression of Ideas and Wants: Rarely/Never expressess or very difficult - rarely/never expresses self or speech is very difficult to understand   Understanding Verbal and Non-Verbal Content Understanding Verbal and Non-Verbal Content: Sometimes understands - understands only basic conversations or simple, direct phrases. Frequently requires cues to understand   Memory/Recall Ability *first 3 days only Memory/Recall Ability *first 3 days only: That he or she is in a hospital/hospital unit     PMSV Assessment  PMSV Trial PMSV was placed for: 15 min Able to redirect subglottic air through upper airway: Yes Able to Attain Phonation: Yes (x1) Voice Quality: Aphonic;Wet;Breathy Able to Expectorate Secretions: Yes Level of Secretion Expectoration with PMSV: Tracheal Breath Support for Phonation: Moderately decreased Intelligibility: Intelligibility reduced Word: 0-24% accurate Respirations During Trial:  (WFL) SpO2 During Trial:  (WFL) Pulse During Trial:  [facility]) Behavior: Lethargic  Bedside Swallowing Assessment General Previous Swallow Assessment: 1/12 MBS Diet Prior to this Study: PEG tube;NPO Respiratory Status: Trach Trach Size and Type: #6;Uncuffed;With PMSV in place History of Recent Intubation: No Behavior/Cognition: Lethargic/Drowsy Oral Cavity - Dentition: Adequate natural dentition Baseline Vocal Quality: Aphonic;Wet;Breathy Volitional Cough: Cognitively unable to elicit Volitional Swallow: Unable to elicit  Oral Care  Assessment Does patient have any of the following "high(er) risk" factors?: Tracheostomy with trach collar 24 hrs./day Patient is HIGH RISK: Non-ventilated: Order set for Adult Oral Care Protocol initiated - "High Risk Patients - Non-Ventilated" option selected  (see row information) Ice Chips Ice chips: Impaired Presentation: Spoon Oral Phase Impairments: Poor awareness of bolus Pharyngeal Phase Impairments: Wet Vocal Quality;Multiple swallows;Decreased hyoid-laryngeal movement;Cough - Immediate Thin Liquid Thin Liquid: Not tested Nectar Thick Nectar Thick Liquid: Not tested Honey Thick Honey Thick Liquid: Not tested Puree Puree: Impaired Presentation: Spoon Oral Phase Impairments: Poor awareness of bolus Pharyngeal Phase Impairments: Decreased hyoid-laryngeal movement;Multiple swallows;Wet Vocal Quality;Cough - Delayed Solid Solid: Not tested BSE Assessment Risk  for Aspiration Impact on safety and function: Severe aspiration risk;Risk for inadequate nutrition/hydration Other Related Risk Factors: Tracheostomy;Decreased management of secretions;Cognitive impairment;Decreased respiratory status  Short Term Goals: Week 1: SLP Short Term Goal 1 (Week 1): Pt will demonstrate focused attention in 1 minute intervals with max A multimodal cues in 50% of opportunties. SLP Short Term Goal 2 (Week 1): Pt will follow 1 step commands with max A multimodal cues in 50% of opportunties. SLP Short Term Goal 3 (Week 1): Pt will respond to yes/no questions with 80% accuarcy in 50% of opportunties with max A multimodal cues. SLP Short Term Goal 4 (Week 1): Pt will communicate wants/needs at word level (mouthing word or written) with max A multimodal cues in 50% of opportunties. SLP Short Term Goal 5 (Week 1): Pt will tolerate PMSV for 30 minutes without s/s of distress. SLP Short Term Goal 6 (Week 1): Pt will consume trials of ice chips with moderate s/s aspiration noted.  Refer to Care Plan for Long  Term Goals  Recommendations for other services: None   Discharge Criteria: Patient will be discharged from SLP if patient refuses treatment 3 consecutive times without medical reason, if treatment goals not met, if there is a change in medical status, if patient makes no progress towards goals or if patient is discharged from hospital.  The above assessment, treatment plan, treatment alternatives and goals were discussed and mutually agreed upon: by patient  Corvette Orser  [facility] 04/23/2020, 5:39 PM

## 2020-04-23 NOTE — Progress Notes (Addendum)
Inpatient Rehabilitation Care Coordinator Assessment and Plan Patient Details  Name: Stacy Moran MRN: [id number] Date of Birth: Sep 30, 1955  Today's Date: 04/23/2020  Hospital Problems: Principal Problem:   ICH (intracerebral hemorrhage) ([place]) Active Problems:   Benign essential HTN   Dysphagia   Status post tracheostomy ([place])   S/P percutaneous endoscopic gastrostomy (PEG) tube placement [facility])  Past Medical History: History reviewed. No pertinent past medical history. Past Surgical History:  Past Surgical History:  Procedure Laterality Date  . BURR HOLE N/A 03/18/2020   Procedure: Haskell Flirt;  Surgeon: [name], MD;  Location: [place];  Service: Neurosurgery;  Laterality: N/A;  . CRANIOTOMY Left 01/30/2020   Procedure: LEFT FAR LATERAL CRANIOTOMY FOR ANEURYSM CLIPPING;  Surgeon: [name], MD;  Location: [place];  Service: Neurosurgery;  Laterality: Left;  . DIRECT LARYNGOSCOPY N/A 02/29/2020   Procedure: DIRECT LARYNGOSCOPY;  Surgeon: [name], MD;  Location: [place];  Service: ENT;  Laterality: N/A;  . IR ANGIO INTRA EXTRACRAN SEL INTERNAL CAROTID BILAT MOD SED  01/30/2020  . IR ANGIO VERTEBRAL SEL VERTEBRAL UNI L MOD SED  01/30/2020  . IR GASTROSTOMY TUBE MOD SED  02/22/2020  . LAPAROSCOPIC REVISION VENTRICULAR-PERITONEAL (V-P) SHUNT N/A 03/18/2020   Procedure: LAPAROSCOPIC REVISION VENTRICULAR-PERITONEAL (V-P) SHUNT;  Surgeon: [name], MD;  Location: [place];  Service: General;  Laterality: N/A;  . RADIOLOGY WITH ANESTHESIA N/A 01/30/2020   Procedure: IR WITH ANESTHESIA;  Surgeon: [name], MD;  Location: [place];  Service: Radiology;  Laterality: N/A;  . THYROIDECTOMY N/A 02/08/2020   Procedure: THYROIDECTOMY;  Surgeon: [name], MD;  Location: [place];  Service: ENT;  Laterality: N/A;  . TRACHEOSTOMY TUBE PLACEMENT N/A 02/08/2020   Procedure: TRACHEOSTOMY;  Surgeon: [name], MD;  Location: [place];  Service: ENT;  Laterality: N/A;  .  TRACHEOSTOMY TUBE PLACEMENT N/A 02/29/2020   Procedure: TRACHEOSTOMY EXCHANGE;  Surgeon: [name], MD;  Location: [place] [facility];  Service: ENT;  Laterality: N/A;  . VENTRICULOPERITONEAL SHUNT N/A 03/18/2020   Procedure: RIGHT SHUNT INSERTION VENTRICULAR-PERITONEAL/ BURR HOLE Evacuation of Subdural Hematoma;  Surgeon: [name], MD;  Location: [place];  Service: Neurosurgery;  Laterality: N/A;   Social History:  reports previous alcohol use. She reports previous drug use. No history on file for tobacco use.  Family / Support Systems Marital Status: Single Patient Roles: Parent,Other (Comment) (employee) Children: Doctor, hospital [phone]-cell only child Other Supports: Sister in [place] other family overseas Anticipated Caregiver: Programmer, multimedia Ability/Limitations of Caregiver: Daughter works first shift and can take some vacation time and then hopefully pt's sister will come from [place] to be with short time Caregiver Availability: 24/7 (short time) Family Dynamics: Close with daughter and her family. Pt is very independent and always taken care of herself and lived independently.  Social History Preferred language: English Religion:  [place]ultural [place]: [place] Education[place] School in [employer] Read: Yes Write: Yes Employment Status: Employed Name of Employer: Production designer, theatre/television/film third shift Return to Work Plans: Dependent upon Geneticist, molecular Issues: No issues Guardian/Conservator: None-according to MD pt is not fully capable of makng her own decisions and will need to look toward her daughter due to next of kin and has no formal POA/HCPOA in place   Abuse/Neglect Abuse/Neglect Assessment Can Be Completed: Unable to assess, patient is non-responsive or altered mental status (Only answers yes/no and has aphasia)  Emotional Status Pt's affect, behavior and adjustment status: Pt does answer yes and no and gestures, she  seems to get her pont across and at times  is not happy about being here. Daughter reports she is very independent and stuborn and wants to do things her way. Recent Psychosocial Issues: other health issues was at work when this happened-did not see a MD prior to admission Psychiatric History: NO history according to daughter-will ask once appropriate for neuro-psych to see for coping due to long hospitalization and for coping Substance Abuse History: No issues according to daughter  Patient / Family Perceptions, Expectations & Goals Pt/Family understanding of illness & functional limitations: Daughter can explain her anneurysm clipping and hemorrhage along with seizure and burr hole procedure. She has spoken with the MD's involved and seems to have a basic understanding of her Mom's condition. Premorbid pt/family roles/activities: Mom, grandmother, sister, employee, etc Anticipated changes in roles/activities/participation: resume Pt/family expectations/goals: Daughter states: " I hope she does well here, I only plan for her to stay with me a short time, she has her own place."  Unable to assess pt due to language issues  [employer]: None Premorbid Home Care/DME Agencies: None Transportation available at discharge: Serena Croissant now daughter will transport her to appointments Resource referrals recommended: Neuropsychology  Discharge Planning Living Arrangements: Alone Support Systems: Dealer Type of Residence: Private residence Insurance Resources: Government social research officer (applied for [employer]) Financial Resources: Employment Museum/gallery curator Screen Referred: Previously completed Living Expenses: Education officer, community Management: Patient Does the patient have any problems obtaining your medications?: No (Didn't have a PCP) Home Management: Patient Patient/Family Preliminary Plans: Plans to go to daughter's home at discharge. Daughter works first shift and has a child. Informed her she will need 24/7 care at  discharge and since she is uninsured the care falls on the family members. Daughter hopes her sister in [place] wil come and help her for a short time. Care Coordinator Barriers to Discharge: Decreased caregiver support,Insurance for SNF coverage Care Coordinator Barriers to Discharge Comments: Currenlty does not have 24/7 care and is uninsured Care Coordinator Anticipated Follow Up Needs: HH/OP  Clinical Impression Pt appears to have been very independent prior to this admission and multiple medical issues. Her daughter is involved but has a job and a child. She plans to take off some but unsure who long can be off. She is hoping pt's sister will come and help her. She has applied for medicaid but not SSD which medicaid will not be approved until her disability. Will reassess pt when appropriate due to her speech/lanaguage deficits. Hopefully pt will do well here, await therapist evaluations.  Elease Hashimoto 04/23/2020, 3:46 PM

## 2020-04-23 NOTE — Progress Notes (Signed)
Initial Nutrition Assessment  DOCUMENTATION CODES:   Severe malnutrition in context of acute illness/injury  INTERVENTION:   - Recommend initiating scheduled bowel regimen as pt's last documented BM was on 04/15/20  Continue tube feeds via PEG: - Osmolite 1.2 @ 75 ml/hr (tube feeding can be held for up to 4 hours for therapies) - Continue ProSource TF 45 ml BID - Continue fee water flushes of 200 ml q 6 hours  Tube feeding regimen provides 1880 kcal, 105 grams of protein, and 1230 ml of H2O.  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  GOAL:   Patient will meet greater than or equal to 90% of their needs  MONITOR:   Diet advancement,Labs,Weight trends,TF tolerance,I & O's  REASON FOR ASSESSMENT:        ASSESSMENT:   65 year old female with unremarkable PMH. Pt presented on 01/30/20 with headache and obtundation requiring intubation for airway protection. 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 Pt underwent right frontal ventriculostomy on 02/26/20. Pt then underwent diagnostic cerebral angiogram for further delineating the left PICA aneurysm which was noted not to be amenable to endovascular treatment so pt underwent suboccipital craniotomy for clipping of PICA aneurysm 01/31/20. Pt required  tracheostomy as well as thyroidectomy on 02/08/20. Pt remains NPO with PEG in place. Admitted to CIR on 04/22/20.   Discussed pt with RN. Unable to obtain diet or weight history from pt at this time. Pt remains NPO with tube feeds infusing via PEG. Pt on trach collar.  Reviewed weight history in chart. Pt with a weight loss of 9.3 kg since 02/25/20. This is a 13.6% weight loss in 2 months which  is significant for timeframe. Based on weight loss and NFPE, pt meets criteria for malnutrition.  RD to adjust tube feeds so that pt can be disconnected from pump for up to 4 hours daily for therapies.  Current TF: Osmolite 1.2 @ 60 ml/hr, ProSource TF 45 ml BID, free water flushes 200 ml q 6 hours  Medications reviewed and include: SSI q 4 hours, protonix, scopolamine patch  Labs reviewed: BUN 28 CBG's: 101-151 x 24 hours  NUTRITION - FOCUSED PHYSICAL EXAM:  Flowsheet Row Most Recent Value  Orbital Region Mild depletion  Upper Arm Region Mild depletion  Thoracic and Lumbar Region No depletion  Buccal Region Mild depletion  Temple Region Mild depletion  Clavicle Bone Region Mild depletion  Clavicle and Acromion Bone Region Mild depletion  Scapular Bone Region No depletion  Dorsal Hand Mild depletion  Patellar Region Mild depletion  Anterior Thigh Region Moderate depletion  Posterior Calf Region Moderate depletion  Edema (RD Assessment) None  Hair Reviewed  Eyes Unable to assess  Mouth Unable to assess  Skin Reviewed  Nails Reviewed       Diet Order:   Diet Order            Diet NPO time specified  Diet effective now                 EDUCATION NEEDS:   No education needs have been identified at this time  Skin:  Skin Assessment: Skin Integrity Issues: Incisions: abdomen  Last BM:  04/15/20  Height:   Ht Readings from Last 1 Encounters:  04/22/20 5\' 9"  (1.753 m)    Weight:   Wt Readings from Last 1 Encounters:  04/22/20 59.3 kg    BMI:  Body mass index is 19.31 kg/m.  Estimated Nutritional Needs:   Kcal:  2202-5427  Protein:  90-105 grams  Fluid:  1.7-1.9 L    Gustavus Bryant, MS, RD, LDN Inpatient Clinical Dietitian Please see [facility] for contact information.

## 2020-04-23 NOTE — Progress Notes (Signed)
[facility] Individual Statement of Services  Patient Name:  Stacy Moran  Date:  04/23/2020  Welcome to the [facility].  Our goal is to provide you with an individualized program based on your diagnosis and situation, designed to meet your specific needs.  With this comprehensive rehabilitation program, you will be expected to participate in at least 3 hours of rehabilitation therapies Monday-Friday, with modified therapy programming on the weekends.  Your rehabilitation program will include the following services:  Physical Therapy (PT), Occupational Therapy (OT), Speech Therapy (ST), 24 hour per day rehabilitation nursing, Neuropsychology, Care Coordinator, Rehabilitation Medicine, Nutrition Services and Pharmacy Services  Weekly team conferences will be held on Wednesday to discuss your progress.  Your Inpatient Rehabilitation Care Coordinator will talk with you frequently to get your input and to update you on team discussions.  Team conferences with you and your family in attendance may also be held.  Expected length of stay: 3 weeks  Overall anticipated outcome: CGA level  Depending on your progress and recovery, your program may change. Your Inpatient Rehabilitation Care Coordinator will coordinate services and will keep you informed of any changes. Your Inpatient Rehabilitation Care Coordinator's name and contact numbers are listed  below.  The following services may also be recommended but are not provided by the [facility] will be made to provide these services after discharge if needed.  Arrangements include referral to agencies that provide these services.  Your insurance has been verified to be:  Medicaid pending Your primary doctor is:  None  Pertinent information will be  shared with your doctor and your insurance company.  Inpatient Rehabilitation Care Coordinator:  Ovidio Kin, [name] or Emilia Beck  Information discussed with and copy given to patient by: Elease Hashimoto, 04/23/2020, 3:49 PM

## 2020-04-23 NOTE — Evaluation (Signed)
Occupational Therapy Assessment and Plan  Patient Details  Name: Stacy Moran MRN: [id number] Date of Birth: May 26, 1955  OT Diagnosis: cognitive deficits, disturbance of vision, hemiplegia affecting non-dominant side and muscle weakness (generalized) Rehab Potential: Rehab Potential (ACUTE ONLY): Fair ELOS: 3 weeks   Today's Date: 04/23/2020 OT Individual Time: 8830-3219 OT Individual Time Calculation (min): 54 min     Hospital Problem: Principal Problem:   ICH (intracerebral hemorrhage) (HCC) Active Problems:   Benign essential HTN   Dysphagia   Status post tracheostomy (HCC)   S/P percutaneous endoscopic gastrostomy (PEG) tube placement [facility])   Past Medical History: History reviewed. No pertinent past medical history. Past Surgical History:  Past Surgical History:  Procedure Laterality Date  . BURR HOLE N/A 03/18/2020   Procedure: Stacy Moran;  Surgeon: Stacy Renshaw, MD;  Location: [facility] OR;  Service: Neurosurgery;  Laterality: N/A;  . CRANIOTOMY Left 01/30/2020   Procedure: LEFT FAR LATERAL CRANIOTOMY FOR ANEURYSM CLIPPING;  Surgeon: Stacy Renshaw, MD;  Location: [facility] OR;  Service: Neurosurgery;  Laterality: Left;  . DIRECT LARYNGOSCOPY N/A 02/29/2020   Procedure: DIRECT LARYNGOSCOPY;  Surgeon: Stacy Colonel, MD;  Location: [facility] OR;  Service: ENT;  Laterality: N/A;  . IR ANGIO INTRA EXTRACRAN SEL INTERNAL CAROTID BILAT MOD SED  01/30/2020  . IR ANGIO VERTEBRAL SEL VERTEBRAL UNI L MOD SED  01/30/2020  . IR GASTROSTOMY TUBE MOD SED  02/22/2020  . LAPAROSCOPIC REVISION VENTRICULAR-PERITONEAL (V-P) SHUNT N/A 03/18/2020   Procedure: LAPAROSCOPIC REVISION VENTRICULAR-PERITONEAL (V-P) SHUNT;  Surgeon: Stacy Mandes, MD;  Location: [facility] OR;  Service: General;  Laterality: N/A;  . RADIOLOGY WITH ANESTHESIA N/A 01/30/2020   Procedure: IR WITH ANESTHESIA;  Surgeon: Stacy Renshaw, MD;  Location: [facility] OR;  Service: Radiology;  Laterality: N/A;  . THYROIDECTOMY N/A 02/08/2020    Procedure: THYROIDECTOMY;  Surgeon: Stacy Colonel, MD;  Location: [facility] OR;  Service: ENT;  Laterality: N/A;  . TRACHEOSTOMY TUBE PLACEMENT N/A 02/08/2020   Procedure: TRACHEOSTOMY;  Surgeon: Stacy Colonel, MD;  Location: [facility] OR;  Service: ENT;  Laterality: N/A;  . TRACHEOSTOMY TUBE PLACEMENT N/A 02/29/2020   Procedure: TRACHEOSTOMY EXCHANGE;  Surgeon: Stacy Colonel, MD;  Location: [facility] OR;  Service: ENT;  Laterality: N/A;  . VENTRICULOPERITONEAL SHUNT N/A 03/18/2020   Procedure: RIGHT SHUNT INSERTION VENTRICULAR-PERITONEAL/ BURR HOLE Evacuation of Subdural Hematoma;  Surgeon: Stacy Renshaw, MD;  Location: [facility] OR;  Service: Neurosurgery;  Laterality: N/A;    Assessment & Plan Clinical Impression: Stacy Phillipis a 65 y.o.right-handed femalewith unremarkable past medical history on no prescription medications.History taken from chart review due to nonverbal. She presented on 01/30/2020 with acute headache progressively worsening with obtundation requiring intubation in the ED for airway protection. Noted blood pressure 262/123. Admission chemistries unremarkable exceptpotassium 3.4,glucose 178, hemoglobin A1c 6.2, alcohol negative, urine drug screen negative. CT the head demonstrated primarily posterior fossa SAHaround the foramen magnum and perimedullary cistern with casting of the fourth ventricle and extension into third and lateral ventricles and associated obstructive HCP. 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 Patient underwent right frontal ventriculostomy on 11/30/2021per Dr. Conchita Moran. She then underwent diagnostic cerebral angiogram for further delineating the left PICA aneurysm. It was noted not to be amenable to endovascular treatment and underwent suboccipital craniotomy for clipping of PICA aneurysm 01/31/2020. Ultrasound soft tissue head and neck showed probable right thyroid nodule2.3 x 2.3 x 2.4 cm.  Hospital course further complicated by prolonged ventsupport requiring tracheostomy as  well as thyroidectomy on 11/12/2021per Dr. Constance Moran and currently has a #6 cuffless flexible Shiley tracheostomy tube in place. Patient currently remains n.p.o. with alternative means of nutritional support.Repeat head CT on 11/19 personally reviewed-improving. She was cleared to begin subcutaneous heparin for DVT prophylaxis. Patient is completing a 21-day course of Nimotopwith close monitoring of blood pressure. Therapy evaluations completed with recommendations of physical medicine rehab consult.   Patient transferred to CIR on 04/22/2020 .    Patient currently requires mod with basic self-care skills secondary to muscle weakness, decreased cardiorespiratoy endurance, decreased visual acuity, decreased attention to left, decreased initiation, decreased attention, decreased awareness, decreased problem solving, decreased safety awareness, decreased memory and delayed processing and decreased sitting balance, decreased standing balance and decreased balance strategies.  Prior to hospitalization, patient could complete ADLs with modified independent .  Patient will benefit from skilled intervention to decrease level of assist with basic self-care skills and increase level of independence with iADL prior to discharge home with care partner.  Anticipate patient will require 24 hour supervision and follow up home health.  OT - End of Session Activity Tolerance: Tolerates 10 - 20 min activity with multiple rests Endurance Deficit: Yes OT Assessment Rehab Potential (ACUTE ONLY): Fair OT Barriers to Discharge: Decreased caregiver support OT Patient demonstrates impairments in the following area(s): Perception;Balance;Safety;Behavior;Cognition;Endurance;Vision;Motor;Pain OT Basic ADL's Functional Problem(s): Grooming;Bathing;Dressing;Toileting OT Transfers Functional Problem(s): Toilet;Tub/Shower OT Additional  Impairment(s): None OT Plan OT Intensity: Minimum of 1-2 x/day, 45 to 90 minutes OT Frequency: 5 out of 7 days OT Duration/Estimated Length of Stay: 3 weeks OT Treatment/Interventions: Balance/vestibular training;Discharge planning;Pain management;Self Care/advanced ADL retraining;Therapeutic Activities;UE/LE Coordination activities;Visual/perceptual remediation/compensation;Therapeutic Exercise;Cognitive remediation/compensation;Functional mobility training;Patient/family education;Wheelchair propulsion/positioning;UE/LE Strength taining/ROM;Psychosocial support;DME/adaptive equipment instruction;Neuromuscular re-education;Community reintegration OT Self Feeding Anticipated Outcome(s): no goal set OT Basic Self-Care Anticipated Outcome(s): supervision OT Toileting Anticipated Outcome(s): supervision OT Bathroom Transfers Anticipated Outcome(s): CGA OT Recommendation Patient destination: Home Follow Up Recommendations: Home health OT Equipment Recommended: To be determined   OT Evaluation Precautions/Restrictions  Precautions Precautions: Fall Precaution Comments: trach 28%/5L; pelvic restraint in bed, PMSV with staff, very low voice quality Restrictions Weight Bearing Restrictions: No General Chart Reviewed: Yes OT Amount of Missed Time: 30 Minutes Family/Caregiver Present: No Vital Signs Therapy Vitals Temp: 98.2 F (36.8 C) Pulse Rate: 79 Resp: 14 BP: 103/62 Patient Position (if appropriate): Lying Oxygen Therapy SpO2: 100 % O2 Device: Tracheostomy Collar O2 Flow Rate (L/min): 5 L/min FiO2 (%): 28 % Pain Pain Assessment Pain Scale: 0-10 Pain Score: 0-No pain Home Living/Prior Functioning Home Living Family/patient expects to be discharged to:: Unsure Living Arrangements: Alone Available Help at Discharge: Family,Available PRN/intermittently Type of Home: Apartment Home Access: Stairs to enter [employer] of Steps: 1 flight Entrance Stairs-Rails:  Right,Left Home Layout: One level Bathroom Shower/Tub: Chiropodist: Standard Bathroom Accessibility: Yes Additional Comments: No family present to get info.  Pt unable. Per chart, patient may dc to dtrs house who is only available intermittently. Unsure of home layout.  Lives With:  (all info from chart) IADL History Homemaking Responsibilities:  (no family present, unable to get info from pt) Prior Function Level of Independence: Independent with basic ADLs,Independent with gait,Independent with homemaking with ambulation,Independent with transfers (per chart)  Able to Take Stairs?: Yes Driving: No Vocation: Full time employment Comments: No family present to gather information, pt unable to participate, no information in the chart. Vision Baseline Vision/History:  (L prosthetic eye missing) Patient Visual Report: No change from baseline Vision Assessment?: Yes Eye  Alignment: Impaired (comment) Ocular Range of Motion: Impaired-to be further tested in functional context Alignment/Gaze Preference: Head turned Tracking/Visual Pursuits: Unable to hold eye position out of midline;Requires cues, head turns, or add eye shifts to track;Impaired - to be further tested in functional context Additional Comments: Unable to throughly assess d/t cognition. Pt without prosthetic eyes in the L socket Perception  Perception: Impaired Inattention/Neglect: Does not attend to left side of body;Impaired-to be further tested in functional context Praxis Praxis: Impaired Praxis Impairment Details: Initiation;Motor planning Cognition Overall Cognitive Status: Impaired/Different from baseline Arousal/Alertness: Awake/alert Orientation Level: Person;Place;Situation Person: Oriented Place: Oriented Situation: Oriented Year: 2022 Month: January Day of Week: Incorrect (Monday) Memory: Impaired Memory Impairment: Decreased recall of new information;Decreased short term  memory Decreased Short Term Memory: Verbal basic Immediate Memory Recall: Sock;Blue;Bed Memory Recall Sock: Not able to recall Memory Recall Blue: Not able to recall Memory Recall Bed: Not able to recall Attention: Focused Focused Attention: Impaired Focused Attention Impairment: Verbal basic;Functional basic Sustained Attention: Impaired Awareness: Impaired Awareness Impairment: Intellectual impairment Problem Solving: Impaired Problem Solving Impairment: Functional basic Safety/Judgment: Impaired Sensation Sensation Light Touch: Appears Intact Hot/Cold: Appears Intact Additional Comments: unable to thoroughly assess due to cognition Coordination Gross Motor Movements are Fluid and Coordinated: No Fine Motor Movements are Fluid and Coordinated: No Coordination and Movement Description: slight L hemi/inattention, difficult to discern true hemi vs cognition and following commands. Heel Shin Test: decr coordination B, decreased ROM Motor  Motor Motor: Abnormal tone Motor - Skilled Clinical Observations: uncoordinated B UE/LE with limited AROM grossly, limited thorough assessment due to cognition  Trunk/Postural Assessment  Cervical Assessment Cervical Assessment: Exceptions to [facility] (forward head) Thoracic Assessment Thoracic Assessment: Exceptions to [facility] (rounded shoulders) Lumbar Assessment Lumbar Assessment: Exceptions to [facility] (posterior pelvic tilt) Postural Control Postural Control: Deficits on evaluation Righting Reactions: delayed and inadequate Protective Responses: delayed and inadequate  Balance Balance Balance Assessed: Yes Standardized Balance Assessment Standardized Balance Assessment: Berg Balance Test Berg Balance Test Sit to Stand: Needs minimal aid to stand or to stabilize Standing Unsupported: Unable to stand 30 seconds unassisted Sitting with Back Unsupported but Feet Supported on Floor or Stool: Able to sit 2 minutes under supervision Stand to Sit:  Needs assistance to sit Transfers: Needs one person to assist Standing Unsupported with Eyes Closed: Needs help to keep from falling Standing Ubsupported with Feet Together: Needs help to attain position and unable to hold for 15 seconds From Standing, Reach Forward with Outstretched Arm: Loses balance while trying/requires external support From Standing Position, Pick up Object from Floor: Unable to try/needs assist to keep balance From Standing Position, Turn to Look Behind Over each Shoulder: Needs assist to keep from losing balance and falling Turn 360 Degrees: Needs assistance while turning Standing Unsupported, Alternately Place Feet on Step/Stool: Needs assistance to keep from falling or unable to try Standing Unsupported, One Foot in Front: Loses balance while stepping or standing Standing on One Leg: Unable to try or needs assist to prevent fall Total Score: 5 Static Sitting Balance Static Sitting - Balance Support: Feet supported Static Sitting - Level of Assistance: 4: Min assist Dynamic Sitting Balance Dynamic Sitting - Balance Support: Feet supported Dynamic Sitting - Level of Assistance: 3: Mod assist;4: Min assist (fluctuates with L lean) Static Standing Balance Static Standing - Balance Support: During functional activity;Bilateral upper extremity supported Static Standing - Level of Assistance: 4: Min assist Dynamic Standing Balance Dynamic Standing - Balance Support: During functional activity;Bilateral upper extremity supported Dynamic  Standing - Level of Assistance: 3: Mod assist Extremity/Trunk Assessment RUE Assessment RUE Assessment: Exceptions to [facility] General Strength Comments: Full AROM, 3+/5 MMT LUE Assessment LUE Assessment: Exceptions to [facility] General Strength Comments: Shoulder flexion limited to 90 degrees, 3/5 MMT  Care Tool Care Tool Self Care Eating Eating activity did not occur: Safety/medical concerns (NPO)      Oral Care  Oral care, brush teeth,  clean dentures activity did not occur: Refused      Bathing   Body parts bathed by patient: Right arm;Left arm;Chest;Abdomen;Front perineal area;Right upper leg;Left upper leg;Face Body parts bathed by helper: Buttocks;Left lower leg;Right lower leg   Assist Level: Moderate Assistance - Patient 50 - 74%    Upper Body Dressing(including orthotics)   What is the patient wearing?: Pull over shirt   Assist Level: Minimal Assistance - Patient > 75%    Lower Body Dressing (excluding footwear)   What is the patient wearing?: Incontinence brief;Pants Assist for lower body dressing: Moderate Assistance - Patient 50 - 74%    Putting on/Taking off footwear   What is the patient wearing?: Non-skid slipper socks Assist for footwear: Total Assistance - Patient < 25%       Care Tool Toileting Toileting activity   Assist for toileting: Moderate Assistance - Patient 50 - 74%     Care Tool Bed Mobility Roll left and right activity   Roll left and right assist level: Contact Guard/Touching assist    Sit to lying activity   Sit to lying assist level: Contact Guard/Touching assist    Lying to sitting edge of bed activity   Lying to sitting edge of bed assist level: Contact Guard/Touching assist     Care Tool Transfers Sit to stand transfer   Sit to stand assist level: Minimal Assistance - Patient > 75%    Chair/bed transfer   Chair/bed transfer assist level: Moderate Assistance - Patient 50 - 74%     Toilet transfer   Assist Level: Moderate Assistance - Patient 50 - 74%     Care Tool Cognition Expression of Ideas and Wants Expression of Ideas and Wants: Rarely/Never expressess or very difficult - rarely/never expresses self or speech is very difficult to understand   Understanding Verbal and Non-Verbal Content Understanding Verbal and Non-Verbal Content: Usually understands - understands most conversations, but misses some part/intent of message. Requires cues at times to understand    Memory/Recall Ability *first 3 days only Memory/Recall Ability *first 3 days only: That he or she is in a hospital/hospital unit    Refer to Care Plan for [place] 1 OT Short Term Goal 1 (Week 1): Pt will sit EOB with no LOB during 1 ADL task OT Short Term Goal 2 (Week 1): Pt will complete bathroom transfer with min HHA OT Short Term Goal 3 (Week 1): Pt will complete LB dressing with min A OT Short Term Goal 4 (Week 1): Pt will require no more than min cueing for focused attention to ADL task  Recommendations for other services: None    Skilled Therapeutic Intervention ADL ADL Eating: NPO Grooming: Minimal assistance Where Assessed-Grooming: Edge of bed Upper Body Bathing: Minimal assistance;Moderate cueing Where Assessed-Upper Body Bathing: Edge of bed Lower Body Bathing: Minimal assistance;Moderate cueing Where Assessed-Lower Body Bathing: Edge of bed Upper Body Dressing: Moderate cueing;Minimal assistance Where Assessed-Upper Body Dressing: Edge of bed Lower Body Dressing: Moderate cueing;Moderate assistance Where Assessed-Lower Body Dressing: Edge of bed Toileting: Moderate  assistance;Moderate cueing Where Assessed-Toileting: Glass blower/designer: Moderate assistance;Moderate verbal cueing Toilet Transfer Method: Counselling psychologist: Grab bars;Bedside commode Mobility  Bed Mobility Bed Mobility: Rolling Right;Rolling Left;Supine to Sit;Sit to Supine Rolling Right: Contact Guard/Touching assist Rolling Left: Contact Guard/Touching assist Supine to Sit: Minimal Assistance - Patient > 75% Sit to Supine: Contact Guard/Touching assist Transfers Sit to Stand: Moderate Assistance - Patient 50-74% Stand to Sit: Moderate Assistance - Patient 50-74%   Skilled OT evaluation completed as detailed above. Pt presents with cognitive deficits, deficits in motor planning/sequencing, visual deficits, and generalized weakness that affects  her ability to perform ADLs with PLOF. She required mod cueing for arousal, attention to task, and command following throughout session and ADLs. Anticipate 3 week stay in CIR if family can provide 24/7 supervision level care at home and manage CGA level goals.   Discharge Criteria: Patient will be discharged from OT if patient refuses treatment 3 consecutive times without medical reason, if treatment goals not met, if there is a change in medical status, if patient makes no progress towards goals or if patient is discharged from hospital.  The above assessment, treatment plan, treatment alternatives and goals were discussed and mutually agreed upon: by patient  Curtis Sites 04/23/2020, 3:17 PM

## 2020-04-23 NOTE — Progress Notes (Signed)
Patient ID: Stacy Moran, female   DOB: December 15, 1955, 65 y.o.   MRN: [id number] Daughter reports pt had her prothetic eye when admitted and it was lost about one month ago on the other floor. Pt has had a prothetic eye since infancy.

## 2020-04-23 NOTE — Progress Notes (Signed)
Inpatient Rehabilitation  Patient information reviewed and entered into eRehab system by Elton Catalano M. Karra Pink, M.A., CCC/SLP, PPS Coordinator.  Information including medical coding, functional ability and quality indicators will be reviewed and updated through discharge.    

## 2020-04-23 NOTE — Progress Notes (Addendum)
[facility] PHYSICAL MEDICINE & REHABILITATION PROGRESS NOTE  Subjective/Complaints: Patient seen laying in bed this morning.  She indicates she slept well overnight.  No reported issues overnight.  She is about to work with therapies.  ROS: Limited due to nonverbal  Objective: Vital Signs: Blood pressure (!) 144/77, pulse 88, temperature 98.9 F (37.2 C), resp. rate 18, height 5\' 9"  (1.753 m), weight 59.3 kg, SpO2 100 %. No results found. Recent Labs    04/23/20 0541  WBC 5.4  HGB 12.0  HCT 39.6  PLT 177   Recent Labs    04/23/20 0541  NA 138  K 4.8  CL 97*  CO2 33*  GLUCOSE 123*  BUN 28*  CREATININE 0.75  CALCIUM 10.2    Intake/Output Summary (Last 24 hours) at 04/23/2020 1013 Last data filed at 04/22/2020 1658 Gross per 24 hour  Intake 0 ml  Output --  Net 0 ml        Physical Exam: BP (!) 144/77 (BP Location: Right Arm)   Pulse 88   Temp 98.9 F (37.2 C)   Resp 18   Ht 5\' 9"  (1.753 m)   Wt 59.3 kg   SpO2 100%   BMI 19.31 kg/m  Constitutional: No distress . Vital signs reviewed. HENT: Scalp with VPS Neck: + #6 cuffless trach with trach collar Eyes: EOMI. No discharge. Cardiovascular: No JVD.  RRR. Respiratory: Normal effort.  No stridor.  Bilateral clear to auscultation. GI: Non-distended.  BS +.  + G-tube. Skin: Warm and dry.  Intact. Psych: Normal mood.  Normal behavior. Musc: No edema in extremities.  No tenderness in extremities. Neuro: Alert  Able to follow commands Motor: 4+/5 throughout  Assessment/Plan: 1. Functional deficits which require 3+ hours per day of interdisciplinary therapy in a comprehensive inpatient rehab setting.  Physiatrist is providing close team supervision and 24 hour management of active medical problems listed below.  Physiatrist and rehab team continue to assess barriers to discharge/monitor patient progress toward functional and medical goals   Care Tool:  Bathing              Bathing assist        Upper Body Dressing/Undressing Upper body dressing   What is the patient wearing?: Hospital gown only    Upper body assist Assist Level: 2 Helpers    Lower Body Dressing/Undressing Lower body dressing      What is the patient wearing?: Incontinence brief     Lower body assist Assist for lower body dressing: Maximal Assistance - Patient 25 - 49%     Toileting Toileting    Toileting assist Assist for toileting: Dependent - Patient 0%     Transfers Chair/bed transfer  Transfers assist           Locomotion Ambulation   Ambulation assist   Ambulation activity did not occur: Safety/medical concerns          Walk 10 feet activity   Assist           Walk 50 feet activity   Assist           Walk 150 feet activity   Assist           Walk 10 feet on uneven surface  activity   Assist           Wheelchair     Assist               Wheelchair 50 feet with 2 turns  activity    Assist            Wheelchair 150 feet activity     Assist           Medical Problem List and Plan: 1. Decreased functional ability with altered mental status/obtunded secondary to SAH/right frontal ventriculostomy 01/30/2020 followed by laparoscopic assisted insertion of ventriculoperitoneal shunt as well 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 and bur hole evacuation of chronic subdural hematoma 03/18/2020  Begin CIR evaluations  Team conference today to discuss current and goals and coordination of care, home and environmental barriers, and discharge planning with nursing, case manager, and therapies. Please see conference note from today as well.  2.  Antithrombotics: -DVT/anticoagulation: Subcutaneous heparin             -antiplatelet therapy: N/A 3. Pain Management: Tylenol as needed 4. Mood: Provide emotional support             -antipsychotic agents: Seroquel 25 mg twice daily             -pt is fall  risk 5. Neuropsych: This patient is not capable of making decisions on her own behalf. 6. Skin/Wound Care: Routine skin checks 7. Fluids/Electrolytes/Nutrition: Routine in and outs 8. Seizure prophylaxis. Keppra 700 mg twice daily 9. 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 Constance Holster.              -Maintained on #6 cuffless Shiley currently, will wean as tolerated 10. Dysphagia. Gastrostomy tube 02/22/2020 per interventional radiology Dr. Earleen Newport.       Dietary and speech therapy follow-up  Advance diet as tolerated 11. Hyperglycemia related to tube feeds. SSI 12.  Hypothyroidism: Synthroid 13. Hypertension. Hydralazine 50 mg every 8 hours, Normodyne 300 mg 3 times daily, Norvasc 10 mg daily  Monitor with increased mobility  LOS: 1 days A FACE TO FACE EVALUATION WAS PERFORMED  Elisa Kutner Lorie Phenix 04/23/2020, 10:13 AM

## 2020-04-23 NOTE — Evaluation (Signed)
Physical Therapy Assessment and Plan  Patient Details  Name: Stacy Moran MRN: [id number] Date of Birth: [birth year]  PT Diagnosis: Abnormal posture, Abnormality of gait, Cognitive deficits, Coordination disorder, Difficulty walking and Impaired cognition Rehab Potential: Good ELOS: 3 weeks   Today's Date: 04/23/2020 PT Individual Time: 0900-0945 PT Individual Time Calculation (min): 45 min  and Today's Date: 04/23/2020 PT Missed Time: 15 Minutes Missed Time Reason: Increased agitation   Hospital Problem: Principal Problem:   ICH (intracerebral hemorrhage) ([place]) Active Problems:   Benign essential HTN   Dysphagia   Status post tracheostomy ([place])   S/P percutaneous endoscopic gastrostomy (PEG) tube placement [facility])   Past Medical History: History reviewed. No pertinent past medical history. Past Surgical History:  Past Surgical History:  Procedure Laterality Date  . BURR HOLE N/A 03/18/2020   Procedure: Haskell Flirt;  Surgeon: [name], MD;  Location: [place];  Service: Neurosurgery;  Laterality: N/A;  . CRANIOTOMY Left 01/30/2020   Procedure: LEFT FAR LATERAL CRANIOTOMY FOR ANEURYSM CLIPPING;  Surgeon: [name], MD;  Location: [place];  Service: Neurosurgery;  Laterality: Left;  . DIRECT LARYNGOSCOPY N/A 02/29/2020   Procedure: DIRECT LARYNGOSCOPY;  Surgeon: [name], MD;  Location: [place];  Service: ENT;  Laterality: N/A;  . IR ANGIO INTRA EXTRACRAN SEL INTERNAL CAROTID BILAT MOD SED  01/30/2020  . IR ANGIO VERTEBRAL SEL VERTEBRAL UNI L MOD SED  01/30/2020  . IR GASTROSTOMY TUBE MOD SED  02/22/2020  . LAPAROSCOPIC REVISION VENTRICULAR-PERITONEAL (V-P) SHUNT N/A 03/18/2020   Procedure: LAPAROSCOPIC REVISION VENTRICULAR-PERITONEAL (V-P) SHUNT;  Surgeon: [name], MD;  Location: [place];  Service: General;  Laterality: N/A;  . RADIOLOGY WITH ANESTHESIA N/A 01/30/2020   Procedure: IR WITH ANESTHESIA;  Surgeon: [name], MD;  Location: [place];  Service:  Radiology;  Laterality: N/A;  . THYROIDECTOMY N/A 02/08/2020   Procedure: THYROIDECTOMY;  Surgeon: [name], MD;  Location: [place];  Service: ENT;  Laterality: N/A;  . TRACHEOSTOMY TUBE PLACEMENT N/A 02/08/2020   Procedure: TRACHEOSTOMY;  Surgeon: [name], MD;  Location: [place];  Service: ENT;  Laterality: N/A;  . TRACHEOSTOMY TUBE PLACEMENT N/A 02/29/2020   Procedure: TRACHEOSTOMY EXCHANGE;  Surgeon: [name], MD;  Location: [place];  Service: ENT;  Laterality: N/A;  . VENTRICULOPERITONEAL SHUNT N/A 03/18/2020   Procedure: RIGHT SHUNT INSERTION VENTRICULAR-PERITONEAL/ BURR HOLE Evacuation of Subdural Hematoma;  Surgeon: [name], MD;  Location: [place];  Service: Neurosurgery;  Laterality: N/A;    Assessment & Plan Clinical Impression: Patient is a 65 y.o. year old female with unremarkable past medical history on no prescription medications.  History taken from chart review due to patient being somnolent.  Presented 01/30/2020 with acute headache progressively worsening with obtundation requiring intubation in the ED for airway protection.  Noted blood pressure to 62/123.  Admission chemistries unremarkable except potassium 3.4 glucose 178 hemoglobin A1c 6.2 alcohol negative urine drug screen negative.  CT of the head demonstrated primarily posterior fossa SAH around the foramen magnum and perimedullary cistern with casting of the fourth ventricle and extension into the third and lateral ventricles and associated obstructive HCP.  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  Patient underwent right frontal ventriculostomy 01/30/2020 per Dr. Kathyrn Sheriff she did underwent diagnostic cerebral angiogram for further delineating left PICA aneurysm.  It was noted not to be amenable to endovascular treatment underwent suboccipital craniotomy for clipping of PICA aneurysm 01/31/2020.  Ultrasound soft tissue head and neck showed  probable right thyroid nodule 2.3 x 2.3 x 2.4 cm.  Hospital course further complicated by prolonged ventilatory support requiring tracheostomy as well as thyroidectomy on 02/08/2020 per Dr. Constance Holster and maintained on #6 cuffless flexible Shiley tracheostomy tube.  Patient remained n.p.o. with alternative means of nutritional support with gastrostomy tube placed 02/22/2020 per interventional radiology Dr. Earleen Newport.   She was cleared to begin subcutaneous heparin for DVT prophylaxis.  She can pleaded a 21-day course of Nimotop for blood pressure control.  Palliative care has been consulted to establish goals of care.  Patient was weaned from her external ventricular drain appear to be neurologically stable but progressively became more somnolent with repeat scan imaging demonstrated ventriculomegaly consistent with delayed onset of hydrocephalus. EEG was negative for seizure. 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 Kathyrn Sheriff. Therapy evaluations were completed due to patient's decrease in functional ability cognitive deficits multiple medical issues she was admitted for a comprehensive rehab program. Patient currently requires min with mobility secondary to muscle weakness, decreased cardiorespiratoy endurance and decreased oxygen support, impaired timing and sequencing, abnormal tone, unbalanced muscle activation, ataxia, decreased coordination and decreased motor planning, decreased visual acuity, decreased visual perceptual skills and decreased visual motor skills, decreased motor planning, decreased initiation, decreased attention, decreased awareness, decreased problem solving, decreased safety awareness, decreased memory and delayed processing and decreased sitting balance, decreased standing balance, decreased postural control and decreased balance  strategies.  Prior to hospitalization, patient was independent  with mobility and lived with   in a [place] home.  Home access is 1 flightStairs to enter.  Patient will benefit from skilled PT intervention to maximize safe functional mobility, minimize fall risk and decrease caregiver burden for planned discharge home with 24 hour supervision and assist.  Anticipate patient will benefit from follow up [facility] at discharge.  PT - End of Session Activity Tolerance: Tolerates 10 - 20 min activity with multiple rests Endurance Deficit: Yes PT Assessment Rehab Potential (ACUTE/IP ONLY): Good PT Barriers to Discharge: [place] home environment;Decreased caregiver support;Home environment access/layout;Trach;Incontinence;Lack of/limited family support;Insurance for SNF coverage;Behavior;New oxygen PT Barriers to Discharge Comments: unsure of dc destination/support available at this time PT Patient demonstrates impairments in the following area(s): Balance;Behavior;Endurance;Motor;Nutrition;Pain;Perception;Safety;Sensory;Skin Integrity PT Transfers Functional Problem(s): Bed Mobility;Bed to Chair;Car;Furniture PT Locomotion Functional Problem(s): Ambulation;Wheelchair Mobility;Stairs PT Plan PT Intensity: Minimum of 1-2 x/day ,45 to 90 minutes PT Frequency: 5 out of 7 days PT Duration Estimated Length of Stay: 3 weeks PT Treatment/Interventions: Ambulation/gait training;Discharge planning;Functional mobility training;Psychosocial support;Therapeutic Activities;Visual/perceptual remediation/compensation;Balance/vestibular training;Disease management/prevention;Neuromuscular re-education;Skin care/wound management;Therapeutic Exercise;Wheelchair propulsion/positioning;Cognitive remediation/compensation;DME/adaptive equipment instruction;Pain management;Splinting/orthotics;UE/LE Strength taining/ROM;Community reintegration;Functional electrical stimulation;Patient/family education;Stair training;UE/LE  Coordination activities PT Transfers Anticipated Outcome(s): CGA PT Locomotion Anticipated Outcome(s): CGA PT Recommendation Recommendations for Other Services: Neuropsych consult;Therapeutic Recreation consult Therapeutic Recreation Interventions: Stress management Follow Up Recommendations: Home health PT;24 hour supervision/assistance Patient destination: Home Equipment Recommended: To be determined   PT Evaluation Precautions/Restrictions Precautions Precautions: Fall Precaution Comments: trach 28%/5L; pelvic restraint in bed Required Braces or Orthoses: Other Brace Other Brace: abdominal binder - for PEG Restrictions Weight Bearing Restrictions: No Home Living/Prior Functioning Home Living Available Help at Discharge: Family;Available PRN/intermittently Type of Home: Apartment Home Access: Stairs to enter Entrance Stairs-Number of Steps: 1 flight Entrance Stairs-Rails: Right;Left Home Layout: One level Bathroom Shower/Tub: Chiropodist: Standard Bathroom Accessibility: Yes Additional  Comments: No family present to get info.  Pt unable. Per chart, patient may dc to dtrs house who is only available intermittently. Unsure of home layout. Prior Function Level of Independence: Independent with basic ADLs;Independent with gait;Independent with homemaking with ambulation;Independent with transfers  Able to Take Stairs?: Yes Driving: No Vocation: Full time employment Vision/Perception  Vision - Assessment Additional Comments: Unable to thoroughly assess due to cognition; patient without prosthetic eye in L socket at time of eval Perception Perception: Within Functional Limits Praxis Praxis: Impaired Praxis Impairment Details: Initiation;Motor planning  Cognition Overall Cognitive Status: Impaired/Different from baseline Arousal/Alertness: Awake/alert Orientation Level: Other (comment) (unable to assess due to being aphonic/agitation) Focused Attention:  Impaired Sustained Attention: Impaired Memory: Impaired Awareness: Impaired Problem Solving: Impaired Safety/Judgment: Impaired Sensation Sensation Light Touch: Appears Intact Hot/Cold: Not tested Proprioception: Not tested Stereognosis: Not tested Additional Comments: unable to thoroughly assess due to cognition Coordination Gross Motor Movements are Fluid and Coordinated: No Fine Motor Movements are Fluid and Coordinated: No Heel Shin Test: decr coordination B, decreased ROM Motor  Motor Motor: Abnormal tone Motor - Skilled Clinical Observations: uncoordinated B UE/LE with limited AROM grossly, limited thorough assessment due to cognition/agitation   Trunk/Postural Assessment  Cervical Assessment Cervical Assessment: Exceptions to [facility] (forward head) Thoracic Assessment Thoracic Assessment: Exceptions to [facility] (increased kyphosis) Lumbar Assessment Lumbar Assessment: Exceptions to [facility] (posterior pelvic tilt) Postural Control Postural Control: Deficits on evaluation Righting Reactions: delayed and inadequate Protective Responses: delayed and inadequate  Balance Balance Balance Assessed: Yes Standardized Balance Assessment Standardized Balance Assessment: Berg Balance Test Berg Balance Test Sit to Stand: Needs minimal aid to stand or to stabilize Standing Unsupported: Unable to stand 30 seconds unassisted Sitting with Back Unsupported but Feet Supported on Floor or Stool: Able to sit 2 minutes under supervision Stand to Sit: Needs assistance to sit Transfers: Needs one person to assist Standing Unsupported with Eyes Closed: Needs help to keep from falling Standing Ubsupported with Feet Together: Needs help to attain position and unable to hold for 15 seconds From Standing, Reach Forward with Outstretched Arm: Loses balance while trying/requires external support From Standing Position, Pick up Object from Floor: Unable to try/needs assist to keep balance From Standing  Position, Turn to Look Behind Over each Shoulder: Needs assist to keep from losing balance and falling Turn 360 Degrees: Needs assistance while turning Standing Unsupported, Alternately Place Feet on Step/Stool: Needs assistance to keep from falling or unable to try Standing Unsupported, One Foot in Front: Loses balance while stepping or standing Standing on One Leg: Unable to try or needs assist to prevent fall Total Score: 5 Static Sitting Balance Static Sitting - Level of Assistance: 5: Stand by assistance Dynamic Sitting Balance Dynamic Sitting - Level of Assistance: 4: Min assist Static Standing Balance Static Standing - Level of Assistance: 4: Min assist Dynamic Standing Balance Dynamic Standing - Level of Assistance: 4: Min assist Extremity Assessment      RLE Assessment General Strength Comments: unable to thoroughly assess due to cognition/increased agitation, but strength functional enough for STS/transfers LLE Assessment LLE Assessment: Exceptions to [facility] General Strength Comments: unable to thoroughly assess due to cognition/increased agitation, but strength functional enough for STS/transfers  Care Tool Care Tool Bed Mobility Roll left and right activity   Roll left and right assist level: Contact Guard/Touching assist    Sit to lying activity   Sit to lying assist level: Contact Guard/Touching assist    Lying to sitting edge of bed activity  Lying to sitting edge of bed assist level: Contact Guard/Touching assist     Care Tool Transfers Sit to stand transfer   Sit to stand assist level: Minimal Assistance - Patient > 75%    Chair/bed transfer   Chair/bed transfer assist level: Minimal Assistance - Patient > 75%     Toilet transfer Toilet transfer activity did not occur: Social worker transfer activity did not occur: Refused        Care Tool Locomotion Ambulation   Assist level: Minimal Assistance - Patient > 75% Assistive device: Hand  held assist Max distance: 2  Walk 10 feet activity Walk 10 feet activity did not occur: Refused       Walk 50 feet with 2 turns activity Walk 50 feet with 2 turns activity did not occur: Refused      Walk 150 feet activity Walk 150 feet activity did not occur: Refused      Walk 10 feet on uneven surfaces activity Walk 10 feet on uneven surfaces activity did not occur: Refused      Stairs Stair activity did not occur: Refused        Walk up/down 1 step activity Walk up/down 1 step or curb (drop down) activity did not occur: Refused     Walk up/down 4 steps activity did not occuR: Refused  Walk up/down 4 steps activity      Walk up/down 12 steps activity Walk up/down 12 steps activity did not occur: Refused      Pick up small objects from floor Pick up small object from the floor (from standing position) activity did not occur: Refused      Wheelchair Will patient use wheelchair at discharge?: Yes Type of Wheelchair: Manual Wheelchair activity did not occur: Refused      Wheel 50 feet with 2 turns activity      Wheel 150 feet activity        Refer to Care Plan for Long Term Goals  SHORT TERM GOAL WEEK 1 PT Short Term Goal 1 (Week 1): Patient will complete bed mobility with CGA consistently PT Short Term Goal 2 (Week 1): Patient will be able to transfer bed<> wc with LRAD and CGA consistently PT Short Term Goal 3 (Week 1): Patient will tolerate sitting up in wc for at least 30 mins outside of therapy with appropriate safety awareness noted PT Short Term Goal 4 (Week 1): Patient will initiate gait training with LRAD  Recommendations for other services: Neuropsych and Therapeutic Recreation  Stress management  Skilled Therapeutic Intervention Mobility Bed Mobility Bed Mobility: Rolling Right;Rolling Left;Supine to Sit;Sit to Supine Rolling Right: Contact Guard/Touching assist Rolling Left: Contact Guard/Touching assist Supine to Sit: Minimal Assistance - Patient >  75% Sit to Supine: Contact Guard/Touching assist Transfers Transfers: Sit to Stand;Stand to Sit;Stand Pivot Transfers Sit to Stand: Contact Guard/Touching assist Stand to Sit: Contact Guard/Touching assist Stand Pivot Transfers: Minimal Assistance - Patient > 75% Stand Pivot Transfer Details: Verbal cues for precautions/safety;Verbal cues for technique;Visual cues/gestures for sequencing Transfer (Assistive device): 1 person hand held assist Locomotion  Gait Ambulation: Yes Gait Assistance: Minimal Assistance - Patient > 75% Gait Distance (Feet): 2 Feet Assistive device: 1 person hand held assist Gait Assistance Details: Verbal cues for gait pattern;Verbal cues for precautions/safety Gait Gait: Yes Gait Pattern: Impaired Gait Pattern: Right foot flat;Left foot flat;Decreased hip/knee flexion - left;Decreased hip/knee flexion - right;Trunk flexed;Narrow base of support Gait velocity: decr Stairs /  Additional Locomotion Stairs: No Architect: Yes Environmental health practitioner: Other (comment) (patient refused at eval) Wheelchair Parts Management: Other (comment) (patient refused at eval)   Discharge Criteria: Patient will be discharged from PT if patient refuses treatment 3 consecutive times without medical reason, if treatment goals not met, if there is a change in medical status, if patient makes no progress towards goals or if patient is discharged from hospital.  The above assessment, treatment plan, treatment alternatives and goals were discussed and mutually agreed upon: No family available/patient unable  Patient received supine in bed with NT present to provide bedlevel perihygiene. Patient agreeable to PT eval and shook her head "no" when asked if she had pain. PT doffing pelvic restraint. Patient appearing to be accurate with yes/no questions attempting to phonate/mouth words as appropriate. She was able to respond to simple, single step commands  intermittently throughout session. Functional AROM noted to B LE when PT adjusting TED hose.  Patient able to come sit edge of bed with CGA/MinA and maintain static sitting balance with close supervision propped on U UE. O2 remaining 100% on 5L via trach collar. Patient able to stand with MinA and HHA and transfer to wc via stand pivot with HHA and MinA. Noted slight ataxia when taking steps toward wc. Once in wc patient transferred to trach collar adaptor on 5L, PSMV applied, O2 sats 98%. PT bending down to help bring B LE onto foot plates, patient motioning as if she were going to hit this PT on the head multiple times. Patient did not appear overly agitated or restless, but continued to motion multiple times as though she were going to hit this PT. RN alerted. Patient refusing to participate in remainder of therapy session. Unclear on origin of this agitation. Assisted patient back to bed. Transferred back to wall O2, doffed PSMV, pelvic restraint applied, bed alarm on, call light within reach. RN present in room.   Debbora Dus 04/23/2020, 11:01 AM

## 2020-04-23 NOTE — IPOC Note (Signed)
Individualized overall Plan of Care (IPOC) Patient Details Name: Stacy Moran MRN: [id number] DOB: Aug 01, 1955  Admitting Diagnosis: ICH (intracerebral hemorrhage) [facility])  Hospital Problems: Principal Problem:   ICH (intracerebral hemorrhage) ([place]) Active Problems:   Benign essential HTN   Dysphagia   Status post tracheostomy ([place])   S/P percutaneous endoscopic gastrostomy (PEG) tube placement [facility])     Functional Problem List: Nursing Bladder,Endurance,Nutrition,Medication Management,Safety,Other (comment),Bowel  PT Balance,Behavior,Endurance,Motor,Nutrition,Pain,Perception,Safety,Sensory,Skin Integrity  OT Perception,Balance,Safety,Behavior,Cognition,Endurance,Vision,Motor,Pain  SLP Cognition  TR         Basic ADL's: OT Grooming,Bathing,Dressing,Toileting     Advanced  ADL's: OT       Transfers: PT Bed Mobility,Bed to Chair,Car,Furniture  OT Toilet,Tub/Shower     Locomotion: PT Ambulation,Wheelchair Mobility,Stairs     Additional Impairments: OT None  SLP Swallowing,Communication,Social Cognition expression,comprehension Problem Solving,Attention,Awareness  TR      Anticipated Outcomes Item Anticipated Outcome  Self Feeding no goal set  Swallowing  Min A   Basic self-care  supervision  Toileting  supervision   Bathroom Transfers CGA  Bowel/Bladder     Transfers  CGA  Locomotion  CGA  Communication  Min A  Cognition  Min A  Pain  pain at or below level 4  Safety/Judgment  maintain safety with min assist   Therapy Plan: PT Intensity: Minimum of 1-2 x/day ,45 to 90 minutes PT Frequency: 5 out of 7 days PT Duration Estimated Length of Stay: 3 weeks OT Intensity: Minimum of 1-2 x/day, 45 to 90 minutes OT Frequency: 5 out of 7 days OT Duration/Estimated Length of Stay: 3 weeks SLP Intensity: Minumum of 1-2 x/day, 30 to 90 minutes SLP Frequency: 3 to 5 out of 7 days SLP Duration/Estimated Length of Stay: 3-4 weeks    Team  Interventions: Nursing Interventions Patient/Family Education,Disease Management/Prevention,Medication Management,Discharge Planning,Dysphagia/Aspiration Precaution Training,Pain Management,Bowel Management,Bladder Management  PT interventions Ambulation/gait training,Discharge planning,Functional mobility training,Psychosocial support,Therapeutic Activities,Visual/perceptual remediation/compensation,Balance/vestibular training,Disease management/prevention,Neuromuscular re-education,Skin care/wound management,Therapeutic Exercise,Wheelchair propulsion/positioning,Cognitive remediation/compensation,DME/adaptive equipment instruction,Pain management,Splinting/orthotics,UE/LE Strength taining/ROM,Community reintegration,Functional electrical stimulation,Patient/family education,Stair training,UE/LE Coordination activities  OT Interventions Balance/vestibular training,Discharge planning,Pain management,Self Care/advanced ADL retraining,Therapeutic Activities,UE/LE Coordination activities,Visual/perceptual remediation/compensation,Therapeutic Exercise,Cognitive remediation/compensation,Functional mobility training,Patient/family education,Wheelchair propulsion/positioning,UE/LE Strength taining/ROM,Psychosocial support,DME/adaptive equipment instruction,Neuromuscular re-education,Community reintegration  SLP Interventions Cognitive remediation/compensation,Cueing hierarchy,Dysphagia/aspiration precaution training,Functional tasks,Internal/external aids,Speech/Language facilitation,Patient/family education  TR Interventions    SW/CM Interventions Discharge Planning,Psychosocial Support,Patient/Family Education   Barriers to Discharge MD  Medical stability, Trach, Behavior, Nutritional means, and New oxygen  Nursing Trach,Decreased caregiver support,Incontinence,New diabetic,Nutrition means,New oxygen    PT Inaccessible home environment,Decreased caregiver support,Home environment  access/layout,Trach,Incontinence,Lack of/limited family support,Insurance for SNF coverage,Behavior,New oxygen unsure of dc destination/support available at this time  OT Decreased caregiver support    SLP      SW Decreased caregiver support,Insurance for SNF coverage Cur[place]lty does not have 24/7 care and is uninsured   Team Discharge Planning: Destination: PT-Home ,OT- Home , SLP-Home Projected Follow-up: PT-Home health PT,24 hour supervision/assistance, OT-  Home health OT, SLP-Home Health SLP,24 hour supervision/assistance,Skilled Nursing facility,Outpatient SLP Projected Equipment Needs: PT-To be determined, OT- To be determined, SLP-None recommended by SLP Equipment Details: PT- , OT-  Patient/family involved in discharge planning: PT- Patient unable/family or caregiver not available,  OT-Patient, SLP-Patient  MD ELOS: 23-27 days. Medical Rehab Prognosis:  Good Assessment: 65 y.o. right-handed female with unremarkable past medical history on no prescription medications.  She presented on 01/30/2020 with acute headache progressively worsening with obtundation requiring intubation in the ED for airway protection.  Noted blood pressure 262/123 .  Admission chemistries unremarkable except potassium  3.4, glucose 178, hemoglobin A1c 6.2, alcohol negative, urine drug screen negative.  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  Patient underwent right frontal ventriculostomy on 02/26/2020 per Dr. Kathyrn Sheriff.  She then underwent diagnostic cerebral angiogram for further delineating the left PICA aneurysm.  It was noted not to be amenable to endovascular treatment and underwent suboccipital craniotomy for clipping  of PICA aneurysm 01/31/2020.  Ultrasound soft tissue head and neck showed probable right thyroid nodule 2.3 x 2.3 x 2.4 cm.  Hospital course further complicated by prolonged vent support requiring tracheostomy as well as thyroidectomy on 02/08/2020 per Dr. Constance Holster and currently has a #6 cuffless flexible Shiley tracheostomy tube in place.  Patient currently remains n.p.o. with alternative means of nutritional support.  Repeat head CT on 11/19 improving.  Patient is completing a 21-day course of Nimotop with close monitoring of blood pressure. Patient with resulting functional deficits with mobility, transfers, balance, self-care.  Will set goals for Supervision with PT/OT and Min A with SLP.   Due to the current state of emergency, patients may not be receiving their 3-hours of Medicare-mandated therapy.  See Team Conference Notes for weekly updates to the plan of care

## 2020-04-23 NOTE — H&P (Signed)
Physical Medicine and Rehabilitation Admission H&P        Chief Complaint  Patient presents with  . Headache  : HPI: Stacy Moran is a 65 year old right-handed female with unremarkable past medical history on no prescription medications.  History taken from chart review due to patient being somnolent.  Presented 01/30/2020 with acute headache progressively worsening with obtundation requiring intubation in the ED for airway protection.  Noted blood pressure to 62/123.  Admission chemistries unremarkable except potassium 3.4 glucose 178 hemoglobin A1c 6.2 alcohol negative urine drug screen negative.  CT of the head demonstrated primarily posterior fossa SAH around the foramen magnum and perimedullary cistern with casting of the fourth ventricle and extension into the third and lateral ventricles and associated obstructive HCP.  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  Patient underwent right frontal ventriculostomy 01/30/2020 per Dr. Conchita Paris she did underwent diagnostic cerebral angiogram for further delineating left PICA aneurysm.  It was noted not to be amenable to endovascular treatment underwent suboccipital craniotomy for clipping of PICA aneurysm 01/31/2020.  Ultrasound soft tissue head and neck showed probable right thyroid nodule 2.3 x 2.3 x 2.4 cm.  Hospital course further complicated by prolonged ventilatory support requiring tracheostomy as well as thyroidectomy on 02/08/2020 per Dr. Pollyann Kennedy and maintained on #6 cuffless flexible Shiley tracheostomy tube.  Patient remained n.p.o. with alternative means of nutritional support with gastrostomy tube placed 02/22/2020 per interventional radiology Dr. Loreta Ave.   She was cleared to begin subcutaneous heparin for DVT prophylaxis.  She can pleaded a 21-day course of Nimotop for blood pressure control.  Palliative care has been consulted to establish goals of care.  Patient was  weaned from her external ventricular drain appear to be neurologically stable but progressively became more somnolent with repeat scan imaging demonstrated ventriculomegaly consistent with delayed onset of hydrocephalus. EEG was negative for seizure. 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 Conchita Paris. Therapy evaluations were completed due to patient's decrease in functional ability cognitive deficits multiple medical issues she was admitted for a comprehensive rehab program.   Review of Systems  Unable to perform ROS: Acuity of condition    History reviewed. No pertinent past medical history.      Past Surgical History:  Procedure Laterality Date  . BURR HOLE N/A 03/18/2020    Procedure: Ezekiel Ina;  Surgeon: Lisbeth Renshaw, MD;  Location: [facility] OR;  Service: Neurosurgery;  Laterality: N/A;  . CRANIOTOMY Left 01/30/2020    Procedure: LEFT FAR LATERAL CRANIOTOMY FOR ANEURYSM CLIPPING;  Surgeon: Lisbeth Renshaw, MD;  Location: [facility] OR;  Service: Neurosurgery;  Laterality: Left;  . DIRECT LARYNGOSCOPY N/A 02/29/2020    Procedure: DIRECT LARYNGOSCOPY;  Surgeon: Serena Colonel, MD;  Location: [facility] OR;  Service: ENT;  Laterality: N/A;  . IR ANGIO INTRA EXTRACRAN SEL INTERNAL CAROTID BILAT MOD SED   01/30/2020  . IR ANGIO VERTEBRAL SEL VERTEBRAL UNI L MOD SED   01/30/2020  . IR GASTROSTOMY TUBE MOD SED   02/22/2020  . LAPAROSCOPIC REVISION VENTRICULAR-PERITONEAL (V-P) SHUNT N/A 03/18/2020    Procedure: LAPAROSCOPIC REVISION VENTRICULAR-PERITONEAL (V-P) SHUNT;  Surgeon: Fritzi Mandes, MD;  Location: [facility] OR;  Service: General;  Laterality: N/A;  . RADIOLOGY WITH ANESTHESIA N/A 01/30/2020    Procedure: IR WITH ANESTHESIA;  Surgeon: Lisbeth Renshaw,  MD;  Location: [facility] OR;  Service: Radiology;  Laterality: N/A;  . THYROIDECTOMY N/A 02/08/2020     Procedure: THYROIDECTOMY;  Surgeon: Serena Colonelosen, Jefry, MD;  [facility];  Service: ENT;  Laterality: N/A;  . TRACHEOSTOMY TUBE PLACEMENT N/A 02/08/2020    Procedure: TRACHEOSTOMY;  Surgeon: Serena Colonelosen, Jefry, MD;  [facility];  Service: ENT;  Laterality: N/A;  . TRACHEOSTOMY TUBE PLACEMENT N/A 02/29/2020    Procedure: TRACHEOSTOMY EXCHANGE;  Surgeon: Serena Colonelosen, Jefry, MD;  [facility];  Service: ENT;  Laterality: N/A;  . VENTRICULOPERITONEAL SHUNT N/A 03/18/2020    Procedure: RIGHT SHUNT INSERTION VENTRICULAR-PERITONEAL/ BURR HOLE Evacuation of Subdural Hematoma;  Surgeon: Lisbeth RenshawNundkumar, Neelesh, MD;  [facility];  Service: Neurosurgery;  Laterality: N/A;    History reviewed. No pertinent family history. Social History:  has no history on file for tobacco use, alcohol use, and drug use. Allergies: No Known Allergies No medications prior to admission.      Drug Regimen Review Drug regimen was reviewed and remains appropriate with no significant issues identified   Home: Home Living Family/patient expects to be discharged to:: Unsure Living Arrangements: Alone Available Help at Discharge: Family,Available PRN/intermittently Type of Home: Apartment Home Access: Stairs to enter Entrance Stairs-Number of Steps: 1 flight Entrance Stairs-Rails: Right,Left Home Layout: One level Bathroom Shower/Tub: Engineer, manufacturing systemsTub/shower unit Bathroom Toilet: Standard Bathroom Accessibility: Yes Additional Comments: No family present to get info.  Pt unable.   Functional History: Prior Function Comments: No family present to gather information, pt unable to participate, no information in the chart.   Functional Status:  Mobility: Bed Mobility Overal bed mobility: Needs Assistance Bed Mobility: Supine to Sit Rolling: Min guard Sidelying to sit: Mod assist Supine to sit: Mod assist Sit to supine: Min assist Sit to sidelying: Mod assist General bed mobility comments: up in recliner upon PT arrival to room,  requests stay in recliner Transfers Overall transfer level: Needs assistance Equipment used: Rolling walker (2 wheeled) Transfers: Sit to/from Stand Sit to Stand: Mod assist Stand pivot transfers: Min assist,Mod assist,+2 physical assistance,+2 safety/equipment (fluctuates)  Lateral/Scoot Transfers: +2 physical assistance,Total assist,+2 safety/equipment General transfer comment: Mod assist for power up, rise and steady. STS x2, from recliner and other chair in room Ambulation/Gait Ambulation/Gait assistance: Mod assist Gait Distance (Feet): 15 Feet (x2) Assistive device: Rolling walker (2 wheeled) Gait Pattern/deviations: Step-through pattern,Decreased stride length,Trunk flexed,Ataxic General Gait Details: Mod assist for steadying, guiding RW and pt, cues for increasing LLE clearance, placement in RW, continuing to turn trunk and hips prior to sitting as pt attempts to sit prematurely x2. Gait velocity: decr Gait velocity interpretation: <1.31 ft/sec, indicative of household ambulator   ADL: ADL Overall ADL's : Needs assistance/impaired Eating/Feeding: NPO Eating/Feeding Details (indicate cue type and reason): requesting food Grooming: Supervision/safety,Sitting,Wash/dry face Grooming Details (indicate cue type and reason): seated EOB Lower Body Bathing: Total assistance,Sitting/lateral leans Lower Body Bathing Details (indicate cue type and reason): simulated via LB dressing tasks; total A from sitting EOB Upper Body Dressing : Maximal assistance,Sitting Upper Body Dressing Details (indicate cue type and reason): donning new gown Lower Body Dressing: Total assistance,Sitting/lateral leans Lower Body Dressing Details (indicate cue type and reason): to don socks Toilet Transfer: +2 for physical assistance,+2 for safety/equipment,Stand-pivot,BSC,Moderate assistance Toilet Transfer Details (indicate cue type and reason): minA to [facility], modA to return to EOB (+2) Toileting- Clothing  Manipulation and Hygiene: Maximal assistance,+2 for physical assistance,+2 for safety/equipment,Sit to/from stand Toileting - Clothing Manipulation Details (indicate cue type and reason): assist  for standing balance while NT assisting with pericare after voiding bladder Functional mobility during ADLs: Minimal assistance,Moderate assistance,+2 for physical assistance,+2 for safety/equipment (fluctuates, stand pivot) General ADL Comments: pt continues to present with cognitive impairments, generalized weakness and impaired balance   Cognition: Cognition Overall Cognitive Status: Impaired/Different from baseline Arousal/Alertness: Awake/alert Orientation Level: Oriented to person,Disoriented to place,Disoriented to time,Disoriented to situation Attention: Sustained Focused Attention: Appears intact Focused Attention Impairment: Verbal basic,Functional basic Sustained Attention: Impaired Sustained Attention Impairment: Functional basic Memory: Impaired Awareness: Impaired Awareness Impairment: Intellectual impairment,Emergent impairment,Anticipatory impairment Problem Solving: Impaired Problem Solving Impairment: Functional basic Behaviors: Restless Safety/Judgment: Impaired Cognition Arousal/Alertness: Awake/alert Behavior During Therapy: Flat affect,Impulsive Overall Cognitive Status: Impaired/Different from baseline Area of Impairment: Attention,Memory,Following commands Orientation Level: Time,Situation Current Attention Level: Sustained Memory: Decreased recall of precautions Following Commands: Follows one step commands with increased time,Follows one step commands inconsistently Safety/Judgement: Decreased awareness of safety,Decreased awareness of deficits Awareness: Intellectual Problem Solving: Difficulty sequencing,Slow processing,Requires tactile cues,Requires verbal cues General Comments: Pt with flat and depressed-like affect, when asked what was wrong pt does not say  anything. Pt attempting to stand and let go of walker unexpectedly during mobility, cues for safety and waiting for PT to assist throughout mobility. Difficult to assess due to: Tracheostomy,Impaired communication   Physical Exam: Blood pressure 133/82, pulse 84, temperature 98.3 F (36.8 C), temperature source Axillary, resp. rate 17, height 5\' 9"  (1.753 m), weight 59.5 kg, SpO2 96 %. Physical Exam Constitutional:      Comments: Scalp incision/ VPS site CDI  HENT:     Head: Normocephalic.     Mouth/Throat:     Mouth: Mucous membranes are moist.  Eyes:     Pupils: Pupils are equal, round, and reactive to light.  Neck:     Comments: #6. Cuffless tracheostomy tube in place Cardiovascular:     Rate and Rhythm: Normal rate and regular rhythm.     Heart sounds: No murmur heard. No gallop.   Pulmonary:     Effort: Pulmonary effort is normal. No respiratory distress.     Breath sounds: No wheezing.  Abdominal:     General: There is no distension.     Palpations: Abdomen is soft.     Tenderness: There is no abdominal tenderness.     Comments: Gastrostomy tube in place, site clean and dry  Musculoskeletal:        General: Normal range of motion.     Cervical back: Normal range of motion.  Skin:    General: Skin is warm.  Neurological:     Mental Status: She is alert.     Comments: Patient is alert and restless. Nonverbal essentially. Will follow simple 1 step commands. Makes eye contact with examiner. She does use yes/ no head nods appropriate to simple questions. UE 4/5 prox to distal. LE: 3+ HF to 4+/5 distally. Senses pain in all 4's. DTR's 1+.   Psychiatric:     Comments: Flat but cooperative on exam        Lab Results Last 48 Hours        Results for orders placed or performed during the hospital encounter of 01/30/20 (from the past 48 hour(s))  Glucose, capillary     Status: Abnormal    Collection Time: 04/20/20 10:42 AM  Result Value Ref Range    Glucose-Capillary 155  (H) 70 - 99 mg/dL      Comment: Glucose reference range applies only to samples taken after fasting for at least 8  hours.    Comment 1 Notify RN      Comment 2 Document in Chart    Glucose, capillary     Status: Abnormal    Collection Time: 04/20/20  3:22 PM  Result Value Ref Range    Glucose-Capillary 107 (H) 70 - 99 mg/dL      Comment: Glucose reference range applies only to samples taken after fasting for at least 8 hours.    Comment 1 Notify RN      Comment 2 Document in Chart    Glucose, capillary     Status: Abnormal    Collection Time: 04/20/20  9:35 PM  Result Value Ref Range    Glucose-Capillary 131 (H) 70 - 99 mg/dL      Comment: Glucose reference range applies only to samples taken after fasting for at least 8 hours.  Glucose, capillary     Status: Abnormal    Collection Time: 04/20/20 11:40 PM  Result Value Ref Range    Glucose-Capillary 101 (H) 70 - 99 mg/dL      Comment: Glucose reference range applies only to samples taken after fasting for at least 8 hours.  Glucose, capillary     Status: Abnormal    Collection Time: 04/21/20  3:36 AM  Result Value Ref Range    Glucose-Capillary 112 (H) 70 - 99 mg/dL      Comment: Glucose reference range applies only to samples taken after fasting for at least 8 hours.  Glucose, capillary     Status: Abnormal    Collection Time: 04/21/20  7:43 AM  Result Value Ref Range    Glucose-Capillary 148 (H) 70 - 99 mg/dL      Comment: Glucose reference range applies only to samples taken after fasting for at least 8 hours.  Glucose, capillary     Status: Abnormal    Collection Time: 04/21/20 12:42 PM  Result Value Ref Range    Glucose-Capillary 139 (H) 70 - 99 mg/dL      Comment: Glucose reference range applies only to samples taken after fasting for at least 8 hours.    Comment 1 Notify RN      Comment 2 Document in Chart    Glucose, capillary     Status: Abnormal    Collection Time: 04/21/20  8:32 PM  Result Value Ref Range     Glucose-Capillary 149 (H) 70 - 99 mg/dL      Comment: Glucose reference range applies only to samples taken after fasting for at least 8 hours.  Glucose, capillary     Status: None    Collection Time: 04/21/20 11:52 PM  Result Value Ref Range    Glucose-Capillary 98 70 - 99 mg/dL      Comment: Glucose reference range applies only to samples taken after fasting for at least 8 hours.  Glucose, capillary     Status: Abnormal    Collection Time: 04/22/20  4:19 AM  Result Value Ref Range    Glucose-Capillary 159 (H) 70 - 99 mg/dL      Comment: Glucose reference range applies only to samples taken after fasting for at least 8 hours.  Glucose, capillary     Status: Abnormal    Collection Time: 04/22/20  7:41 AM  Result Value Ref Range    Glucose-Capillary 133 (H) 70 - 99 mg/dL      Comment: Glucose reference range applies only to samples taken after fasting for at least 8 hours.  Imaging Results (Last 48 hours)  No results found.           Medical Problem List and Plan: 1. 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  Antithrombotics: -DVT/anticoagulation: Subcutaneous heparin             -antiplatelet therapy: N/A 3. Pain Management: Tylenol as needed 4. Mood: Provide emotional support             -antipsychotic agents: Seroquel 25 mg twice daily             -pt is fall risk 5. Neuropsych: This patient is not capable of making decisions on her own behalf. 6. Skin/Wound Care: Routine skin checks 7. Fluids/Electrolytes/Nutrition: Routine in and outs with follow-up chemistries 8. Seizure prophylaxis. Keppra 700 mg twice daily 9.  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 Constance Holster.              -Maintained on #6 cuffless Shiley currently             -seems to be an opportunity to downsize soon 10. Dysphagia. Gastrostomy tube 02/22/2020 per interventional radiology Dr. Earleen Newport.     -Dietary and speech therapy follow-up 11. Hyperglycemia related to tube feeds. SSI 12. Hypothyroidism. Synthroid 13. Hypertension. Hydralazine 50 mg every 8 hours, Normodyne 300 mg 3 times daily, Norvasc 10 mg daily       Cathlyn Parsons, PA-C 04/22/2020   I have personally performed a face to face diagnostic evaluation of this patient and formulated the key components of the plan.  Additionally, I have personally reviewed laboratory data, imaging studies, as well as relevant notes and concur with the physician assistant's documentation above.  The patient's status has not changed from the original H&P completed 04/22/20.  Any changes in documentation from the acute care chart have been noted above.  Meredith Staggers, MD, Mellody Drown

## 2020-04-24 ENCOUNTER — Inpatient Hospital Stay (HOSPITAL_COMMUNITY): Payer: Self-pay | Admitting: Occupational Therapy

## 2020-04-24 ENCOUNTER — Inpatient Hospital Stay (HOSPITAL_COMMUNITY): Payer: Self-pay

## 2020-04-24 DIAGNOSIS — E43 Unspecified severe protein-calorie malnutrition: Secondary | ICD-10-CM | POA: Insufficient documentation

## 2020-04-24 DIAGNOSIS — I69391 Dysphagia following cerebral infarction: Secondary | ICD-10-CM

## 2020-04-24 DIAGNOSIS — R739 Hyperglycemia, unspecified: Secondary | ICD-10-CM

## 2020-04-24 LAB — GLUCOSE, CAPILLARY
Glucose-Capillary: 148 mg/dL — ABNORMAL HIGH (ref 70–99)
Glucose-Capillary: 138 mg/dL — ABNORMAL HIGH (ref 70–99)
Glucose-Capillary: 135 mg/dL — ABNORMAL HIGH (ref 70–99)
Glucose-Capillary: 72 mg/dL (ref 70–99)
Glucose-Capillary: 161 mg/dL — ABNORMAL HIGH (ref 70–99)

## 2020-04-24 NOTE — Progress Notes (Signed)
Physical Therapy Session Note  Patient Details  Name: Stacy Moran MRN: [id number] Date of Birth: [birth year]  Today's Date: 04/24/2020 PT Individual Time: 1100-1157 PT Individual Time Calculation (min): 57 min   Short Term Goals: Week 1:  PT Short Term Goal 1 (Week 1): Patient will complete bed mobility with CGA consistently PT Short Term Goal 2 (Week 1): Patient will be able to transfer bed<> wc with LRAD and CGA consistently PT Short Term Goal 3 (Week 1): Patient will tolerate sitting up in wc for at least 30 mins outside of therapy with appropriate safety awareness noted PT Short Term Goal 4 (Week 1): Patient will initiate gait training with LRAD  Skilled Therapeutic Interventions/Progress Updates:    Pt greeted sleeping in bed, awakens to voice with effort, nods head 'yes; to therapy and shakes head 'no' to pain. She declines to use PMV during our session. Pelvic restraint removed on arrival dn reapplyed at end of session. Retrieved RN to pause her PEG tube feeding. She was on 4L wall air on arrival and used 4L portable tank via trach mask during our session. Pt with eyes closed for ~75% of our session and had trouble engaging her to participate in functional activities, vitals stable.   Performed supine<>sit with bed features, requiring minA for initiation and minA for scooting forwards to EOB. Stand<>pivot with minA from EOB to w/c with cues for initiation, sequencing, and hand placement. W/c transport to ortho gym for time management.   Engaged in gait training, 2x53ft (seated rest) with minA and RW and with chair follow for safety. Posterior bias noted during gait and she had trouble managing RW, especially with turns and safety approach to chairs. She tends to "hang" on her posterior knee capsule during stance on both legs and had trouble correcting this with cues.  Performed static standing balance with minA guard and BUE support on a table with task overlay for completing peg board  puzzle. She tolerated standing for ~1 minute prior to abrupt sitting and unable to safely engage her in further standing. She began to complete the puzzle while seated, therefore, allowed this to promote motor planning, visuo/spatial organization, and therapeutic engagement. She required max cues for correct peg placement to match the simple picture. Very poor attention and very easily distracted by her environment. Unable to successfully complete the puzzle due to cog deficits and time restraints.   Returned to her room with totalA in w/c and stand<>pivot with minA from w/c to EOB. She was able to complete sit>supine with supervision with use of bed features. She remained semi-reclined in bed with needs in reach, bed alarm on, pelvic restraint re-applied, connected to wall oxygen 4L.  Therapy Documentation Precautions:  Precautions Precautions: Fall Precaution Comments: trach 28%/5L; pelvic restraint in bed, PMSV with staff, very low voice quality Required Braces or Orthoses: Other Brace Other Brace: abdominal binder - for PEG Restrictions Weight Bearing Restrictions: No  Therapy/Group: Individual Therapy  Stacy Moran P Kelvin Sennett PT 04/24/2020, 7:35 AM

## 2020-04-24 NOTE — Progress Notes (Signed)
Occupational Therapy Session Note  Patient Details  Name: Stacy Moran MRN: [id number] Date of Birth: 1955-11-25  Today's Date: 04/24/2020 OT Individual Time: 1415-1530 OT Individual Time Calculation (min): 60 min    Short Term Goals: Week 1:  OT Short Term Goal 1 (Week 1): Pt will sit EOB with no LOB during 1 ADL task OT Short Term Goal 2 (Week 1): Pt will complete bathroom transfer with min HHA OT Short Term Goal 3 (Week 1): Pt will complete LB dressing with min A OT Short Term Goal 4 (Week 1): Pt will require no more than min cueing for focused attention to ADL task  Skilled Therapeutic Interventions/Progress Updates:    Pt sitting upright in bed, no signs of pain, however did exhibit consistent wet cough with significant secretions through trach requiring dependent external suction and resulting in need for deep suction completed by nursing, therefore pt missed 15 minutes of beginning of session. Nursing also placed PEG on hold for session. Oxygen sats at 100% on 4Ls trach collar at 28%. Pt nodded yes in response to participating with OT session.  Pt required min assist to initiate each transfer including sit<>stand from EOB and w/c, and Stand pivot w/c<>EOM using RW.  Also required max TCs to facilitate safe hand placement through all functional transfers.  Pt ambulated 2 intervals of approximately 25 feet each using RW with min assist and TCs to promote hip extension and upright posture as well as safe RW mgt due to pt forward leaning at hip and pushing RW in front of self outside BOS.  Pt participated in dynamic standing activity to facilitate upright posture and improved body mechanics.  Pt with significant distractibility throughout task and requiring repetitive multmodal cues to attend and follow simple commands.  Pt transported back to room and completed stand pivot w/c to EOB with mod assist due to pt having difficulty following command and attempting to face bed in standing.  Pt  completed sit to supine with increased time and max multmodal cueing.  Call bell in reach, posey belt donned, bed alarm on at end of session.  Nurse notified of pts location.  Therapy Documentation Precautions:  Precautions Precautions: Fall Precaution Comments: trach 28%/5L; pelvic restraint in bed, PMSV with staff, very low voice quality Required Braces or Orthoses: Other Brace Other Brace: abdominal binder - for PEG Restrictions Weight Bearing Restrictions: No   Therapy/Group: Individual Therapy  Ezekiel Slocumb 04/24/2020, 4:47 PM

## 2020-04-24 NOTE — Progress Notes (Signed)
Occupational Therapy Session Note  Patient Details  Name: Stacy Moran MRN: [id number] Date of Birth: 30-Jun-1955  Today's Date: 04/24/2020 OT Individual Time: 0903-1000 OT Individual Time Calculation (min): 57 min    Short Term Goals: Week 1:  OT Short Term Goal 1 (Week 1): Pt will sit EOB with no LOB during 1 ADL task OT Short Term Goal 2 (Week 1): Pt will complete bathroom transfer with min HHA OT Short Term Goal 3 (Week 1): Pt will complete LB dressing with min A OT Short Term Goal 4 (Week 1): Pt will require no more than min cueing for focused attention to ADL task  Skilled Therapeutic Interventions/Progress Updates:    Pt at the foot of the bed trying to get past the foot rail at start of session.  Noted pt sitting in liquid stool as well with PEG tube alarming stating blockage.  Redirected pt to lay back down with mod instructional cueing in order for therapist to organize session.  Oxygen sats at 98% on 5Ls trach collar at 28%.  She was setup on the EOB with washcloths and towels for bathing.  Max instructional cueing to sequencing bathing.  She was able to use BUEs for squeezing out washcloth, but needed max cueing for awareness to not push down on the cardboard basin.  When attempting to apply more soap to the washcloth, she demonstrated some motor planning difficulty with opening the top as well as with orientation of the soap, as she was holding it in a position that it would have poured out in the floor.  Therapist helped to correct this.  She needed min to mod assist for sit to stand with mod demonstrational cueing to initiate sit to stand.  Pt did assist with washing of front peri area, but due to the severity of the incontinence, therapist had to provide assist as well.  Once bathing was complete, she transferred stand pivot to the wheelchair with mod assist to work on dressing.  She needed total assist for donning pullover shirt secondary to motor planning difficulty.  When assisting  her with donning the right sleeve, she would remove it X2 once in place.  Therapist had to place it over both UEs and then pull over head.  She assisted slightly with pulling it down over her trunk but still need total assist.  She was able to donn her gripper socks when given to her with min assist but needed max assist for threading pants over feet and for standing to pull them up over her hips.  Completed session with transfer back to the bed with mod assist and transition to supine with min guard assist.  Oxygen sats throughout session 94% or greater with HR in the low 80's.  She was left with the call button and phone in reach and posey belt in place.  Bed alarm in place as well.  Nursing made aware of bowel and bladder incontinence.    Therapy Documentation Precautions:  Precautions Precautions: Fall Precaution Comments: trach 28%/5L; pelvic restraint in bed, PMSV with staff, very low voice quality Required Braces or Orthoses: Other Brace Other Brace: abdominal binder - for PEG Restrictions Weight Bearing Restrictions: No  Vital Signs: Therapy Vitals Pulse Rate: 87 Resp: 16 BP: 126/84 Oxygen Therapy SpO2: 97 % O2 Device: Tracheostomy Collar O2 Flow Rate (L/min): 5 L/min FiO2 (%): 28 % Pain: Pain Assessment Pain Scale: Faces Pain Score: 0-No pain ADL: See Care Tool Section for some details of mobility and selfcare  Therapy/Group: Individual Therapy  Fredrik Mogel OTR/L 04/24/2020, 12:35 PM

## 2020-04-24 NOTE — Progress Notes (Signed)
[facility] PHYSICAL MEDICINE & REHABILITATION PROGRESS NOTE  Subjective/Complaints: Patient seen sitting up in bed this morning. She states she slept well overnight.  No reported issues overnight.  She mouths that she wants to go home.  ROS: Patient denies CP, shortness of breath, nausea, vomiting, diarrhea.  Objective: Vital Signs: Blood pressure 127/88, pulse 92, temperature 98.9 F (37.2 C), temperature source Oral, resp. rate 19, height 5\' 9"  (1.753 m), weight 59.3 kg, SpO2 99 %. No results found. Recent Labs    04/23/20 0541  WBC 5.4  HGB 12.0  HCT 39.6  PLT 177   Recent Labs    04/23/20 0541  NA 138  K 4.8  CL 97*  CO2 33*  GLUCOSE 123*  BUN 28*  CREATININE 0.75  CALCIUM 10.2   No intake or output data in the 24 hours ending 04/24/20 0855      Physical Exam: BP 127/88 (BP Location: Left Arm)   Pulse 92   Temp 98.9 F (37.2 C) (Oral)   Resp 19   Ht 5\' 9"  (1.753 m)   Wt 59.3 kg   SpO2 99%   BMI 19.31 kg/m  Constitutional: No distress . Vital signs reviewed. HENT: Scalp with VPS Neck: + #6 cuffless trach with trach collar Eyes: Right EOMI.  No discharge. Cardiovascular: No JVD.  RRR. Respiratory: Normal effort.  No stridor.  Bilateral clear to auscultation. GI: Non-distended.  BS +.  + G-tube. Skin: Warm and dry.  Intact. Psych: Normal mood.  Normal behavior. Musc: No edema in extremities.  No tenderness in extremities. Neuro: Alert Able to follow commands Motor: 4+/5 throughout, unchanged Left eye ptosis  Assessment/Plan: 1. Functional deficits which require 3+ hours per day of interdisciplinary therapy in a comprehensive inpatient rehab setting.  Physiatrist is providing close team supervision and 24 hour management of active medical problems listed below.  Physiatrist and rehab team continue to assess barriers to discharge/monitor patient progress toward functional and medical goals   Care Tool:  Bathing    Body parts bathed by patient:  Right arm,Left arm,Chest,Abdomen,Front perineal area,Right upper leg,Left upper leg,Face   Body parts bathed by helper: Buttocks,Left lower leg,Right lower leg     Bathing assist Assist Level: Moderate Assistance - Patient 50 - 74%     Upper Body Dressing/Undressing Upper body dressing   What is the patient wearing?: Pull over shirt    Upper body assist Assist Level: Minimal Assistance - Patient > 75%    Lower Body Dressing/Undressing Lower body dressing      What is the patient wearing?: Incontinence brief     Lower body assist Assist for lower body dressing: Maximal Assistance - Patient 25 - 49%     Toileting Toileting    Toileting assist Assist for toileting: Moderate Assistance - Patient 50 - 74%     Transfers Chair/bed transfer  Transfers assist     Chair/bed transfer assist level: Moderate Assistance - Patient 50 - 74%     Locomotion Ambulation   Ambulation assist   Ambulation activity did not occur: Safety/medical concerns  Assist level: Minimal Assistance - Patient > 75% Assistive device: Hand held assist Max distance: 2   Walk 10 feet activity   Assist  Walk 10 feet activity did not occur: Refused        Walk 50 feet activity   Assist Walk 50 feet with 2 turns activity did not occur: Refused         Walk 150 feet  activity   Assist Walk 150 feet activity did not occur: Refused         Walk 10 feet on uneven surface  activity   Assist Walk 10 feet on uneven surfaces activity did not occur: Refused         Wheelchair     Assist Will patient use wheelchair at discharge?: Yes Type of Wheelchair: Manual Wheelchair activity did not occur: Refused         Wheelchair 50 feet with 2 turns activity    Assist            Wheelchair 150 feet activity     Assist           Medical Problem List and Plan: 1. 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  2.  Antithrombotics: -DVT/anticoagulation: Subcutaneous heparin             -antiplatelet therapy: N/A 3. Pain Management: Tylenol as needed 4. Mood: Provide emotional support             -antipsychotic agents: Seroquel 25 mg twice daily   Noted to be agitated at times with therapies, will continue to monitor             -pt is fall risk 5. Neuropsych: This patient is not capable of making decisions on her own behalf. 6. Skin/Wound Care: Routine skin checks 7. Fluids/Electrolytes/Nutrition: Routine in and outs 8. Seizure prophylaxis. Keppra 700 mg twice daily  Plan to wean 9. 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 Constance Holster.              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 Earleen Newport.       Dietary and speech therapy follow-up  Advance diet as tolerated 11. Hyperglycemia related to tube feeds. SSI  Mildly elevated on 1/27 12.  Hypothyroidism: Synthroid 13. Hypertension. Hydralazine 50 mg every 8 hours, Normodyne 300 mg 3 times daily, Norvasc 10 mg daily  Controlled with meds on 1/27  Monitor with increased mobility  LOS: 2 days A FACE TO FACE EVALUATION WAS PERFORMED  Pierson Vantol Lorie Phenix 04/24/2020, 8:55 AM

## 2020-04-25 DIAGNOSIS — I10 Essential (primary) hypertension: Secondary | ICD-10-CM

## 2020-04-25 DIAGNOSIS — Z2989 Encounter for other specified prophylactic measures: Secondary | ICD-10-CM

## 2020-04-25 DIAGNOSIS — Z298 Encounter for other specified prophylactic measures: Secondary | ICD-10-CM

## 2020-04-25 LAB — GLUCOSE, CAPILLARY
Glucose-Capillary: 109 mg/dL — ABNORMAL HIGH (ref 70–99)
Glucose-Capillary: 117 mg/dL — ABNORMAL HIGH (ref 70–99)
Glucose-Capillary: 120 mg/dL — ABNORMAL HIGH (ref 70–99)
Glucose-Capillary: 129 mg/dL — ABNORMAL HIGH (ref 70–99)
Glucose-Capillary: 129 mg/dL — ABNORMAL HIGH (ref 70–99)
Glucose-Capillary: 152 mg/dL — ABNORMAL HIGH (ref 70–99)
Glucose-Capillary: 161 mg/dL — ABNORMAL HIGH (ref 70–99)

## 2020-04-25 MED ORDER — LEVETIRACETAM 100 MG/ML PO SOLN
500.0000 mg | Freq: Two times a day (BID) | ORAL | Status: DC
  Administered 2020-04-25 – 2020-04-29 (×9): 500 mg
  Filled 2020-04-25 (×10): qty 5

## 2020-04-25 MED ORDER — QUETIAPINE FUMARATE 50 MG PO TABS
50.0000 mg | ORAL_TABLET | Freq: Two times a day (BID) | ORAL | Status: DC
  Administered 2020-04-25 – 2020-05-21 (×52): 50 mg
  Filled 2020-04-25 (×53): qty 1

## 2020-04-25 NOTE — Progress Notes (Signed)
Occupational Therapy Session Note  Patient Details  Name: Stacy Moran MRN: [id number] Date of Birth: November 15, 1955  Today's Date: 04/25/2020 OT Individual Time: 1315-1408 OT Individual Time Calculation (min): 53 min    Short Term Goals: Week 1:  OT Short Term Goal 1 (Week 1): Pt will sit EOB with no LOB during 1 ADL task OT Short Term Goal 2 (Week 1): Pt will complete bathroom transfer with min HHA OT Short Term Goal 3 (Week 1): Pt will complete LB dressing with min A OT Short Term Goal 4 (Week 1): Pt will require no more than min cueing for focused attention to ADL task  Skilled Therapeutic Interventions/Progress Updates:    Pt semi upright in bed, nurse present for medication management.  Respiratory Therapist arrived immediately thereafter and performed deep suction. SPO2 96% on 4Ls trach collar at 28% humidified air throughout session.  Pt nonverbal throughout session, however did not show signs of pain or discomfort throughout.  Pt required repetitive simple VCs and max TCs to complete supine to sit EOB in preparation for self care. External distractions removed from pts reach due to pt exhibiting significant distractibility and frequent grasping/pulling at items without purpose.  Pt also required hand over hand initially to facilitate ideation and initiation of each self care task and then pt able to follow through with remainder of task including doffing shirt overhead and bathing UB/ BLE with max repetitive verbal cues.  Pt required total assist to manage trach collar during UB dressing, and donning/doffing of abdominal binder.  Pt required max assist with max multimodal cues to donn brief and pants at sit<>stand level (using RW for balance) with pt attempting to sit prematurely multiple times prior to completion of pulling clothing over hips.  Min assist to complete sit to supine primarily due to difficulty with following of command.  Pt left with posey belt donned, call bell in reach, bed  alarm on at end of session.  Therapy Documentation Precautions:  Precautions Precautions: Fall Precaution Comments: trach 28%/5L; pelvic restraint in bed, PMSV with staff, very low voice quality Required Braces or Orthoses: Other Brace Other Brace: abdominal binder - for PEG Restrictions Weight Bearing Restrictions: No   Therapy/Group: Individual Therapy  Ezekiel Slocumb 04/25/2020, 2:34 PM

## 2020-04-25 NOTE — Progress Notes (Addendum)
[facility] PHYSICAL MEDICINE & REHABILITATION PROGRESS NOTE  Subjective/Complaints: Patient seen sitting up in bed this morning.  She indicates she slept well overnight.  He is working with therapies.  Appears more restless this morning.  No reported issues overnight.  ROS: Limited due to cognition  Objective: Vital Signs: Blood pressure 120/72, pulse 80, temperature 98.4 F (36.9 C), temperature source Oral, resp. rate 16, height 5\' 9"  (1.753 m), weight 59.3 kg, SpO2 100 %. No results found. Recent Labs    04/23/20 0541  WBC 5.4  HGB 12.0  HCT 39.6  PLT 177   Recent Labs    04/23/20 0541  NA 138  K 4.8  CL 97*  CO2 33*  GLUCOSE 123*  BUN 28*  CREATININE 0.75  CALCIUM 10.2   No intake or output data in the 24 hours ending 04/25/20 1006      Physical Exam: BP 120/72   Pulse 80   Temp 98.4 F (36.9 C) (Oral)   Resp 16   Ht 5\' 9"  (1.753 m)   Wt 59.3 kg   SpO2 100%   BMI 19.31 kg/m  Constitutional: No distress . Vital signs reviewed. HENT:  Scalp with VPS Neck: + #6 cuffless trach with trach collar Eyes: Right eye EOMI.  No discharge. Cardiovascular: No JVD.  RRR. Respiratory: Normal effort.  Upper airway sounds. GI: Non-distended.  BS + + G-tube. Skin: Warm and dry.  Intact. Psych: Distracted.  Restless. Musc: No edema in extremities.  No tenderness in extremities. Neuro: Alert Able to follow commands Motor: 4+/5 throughout, stable Left eye ptosis  Assessment/Plan: 1. Functional deficits which require 3+ hours per day of interdisciplinary therapy in a comprehensive inpatient rehab setting.  Physiatrist is providing close team supervision and 24 hour management of active medical problems listed below.  Physiatrist and rehab team continue to assess barriers to discharge/monitor patient progress toward functional and medical goals   Care Tool:  Bathing    Body parts bathed by patient: Right arm,Left arm,Chest,Abdomen,Right upper leg,Left upper  leg,Right lower leg,Left lower leg,Face   Body parts bathed by helper: Front perineal area,Buttocks     Bathing assist Assist Level: Moderate Assistance - Patient 50 - 74%     Upper Body Dressing/Undressing Upper body dressing   What is the patient wearing?: Hospital gown only    Upper body assist Assist Level: Total Assistance - Patient < 25%    Lower Body Dressing/Undressing Lower body dressing      What is the patient wearing?: Incontinence brief     Lower body assist Assist for lower body dressing: Maximal Assistance - Patient 25 - 49%     Toileting Toileting    Toileting assist Assist for toileting: Total Assistance - Patient < 25%     Transfers Chair/bed transfer  Transfers assist     Chair/bed transfer assist level: Moderate Assistance - Patient 50 - 74%     Locomotion Ambulation   Ambulation assist   Ambulation activity did not occur: Safety/medical concerns  Assist level: Minimal Assistance - Patient > 75% Assistive device: Hand held assist Max distance: 2   Walk 10 feet activity   Assist  Walk 10 feet activity did not occur: Refused        Walk 50 feet activity   Assist Walk 50 feet with 2 turns activity did not occur: Refused         Walk 150 feet activity   Assist Walk 150 feet activity did not occur:  Refused         Walk 10 feet on uneven surface  activity   Assist Walk 10 feet on uneven surfaces activity did not occur: Refused         Wheelchair     Assist Will patient use wheelchair at discharge?: Yes Type of Wheelchair: Manual Wheelchair activity did not occur: Refused         Wheelchair 50 feet with 2 turns activity    Assist            Wheelchair 150 feet activity     Assist           Medical Problem List and Plan: 1. 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  2.  Antithrombotics: -DVT/anticoagulation: Subcutaneous heparin             -antiplatelet therapy: N/A 3. Pain Management: Tylenol as needed 4. Mood: Provide emotional support             -antipsychotic agents: Seroquel 25 mg twice daily   Noted to be agitated at times with therapies, will continue to monitor             -pt is fall risk 5. Neuropsych: This patient is not capable of making decisions on her own behalf.  Will consider Ritalin if patient continues to be distracted. 6. Skin/Wound Care: Routine skin checks 7. Fluids/Electrolytes/Nutrition: Routine in and outs  BMP ordered for Monday 8. Seizure prophylaxis.   Keppra decreased to 500 BID on 1/28 9. 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 Constance Holster.              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 Earleen Newport.       Dietary and speech therapy follow-up  Advance diet as tolerated 11. Hyperglycemia related to tube feeds. SSI  Slightly labile on 1/28 12.  Hypothyroidism: Synthroid 13. Hypertension. Hydralazine 50 mg every 8 hours, Normodyne 300 mg 3 times daily, Norvasc 10 mg daily  Controlled with meds on 1/28  Monitor with increased mobility  LOS: 3 days A FACE TO FACE EVALUATION WAS PERFORMED  Jullie Arps Lorie Phenix 04/25/2020, 10:06 AM

## 2020-04-25 NOTE — Progress Notes (Signed)
Patient ID: Stacy Moran, female   DOB: [birth year], 65 y.o.   MRN: [id number] Spoke with Joaine-med assist is working on [employer] and disability with daughter have not begun applications due to confusion if pt had insurance. 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

## 2020-04-25 NOTE — Progress Notes (Signed)
Speech Language Pathology Daily Session Note  Patient Details  Name: Stacy Moran MRN: [id number] Date of Birth: [birth year]  Today's Date: 04/25/2020 SLP Individual Time: 2778-2423 SLP Individual Time Calculation (min): 45 min  Short Term Goals: Week 1: SLP Short Term Goal 1 (Week 1): Pt will demonstrate focused attention in 1 minute intervals with max A multimodal cues in 50% of opportunties. SLP Short Term Goal 2 (Week 1): Pt will follow 1 step commands with max A multimodal cues in 50% of opportunties. SLP Short Term Goal 3 (Week 1): Pt will respond to yes/no questions with 80% accuarcy in 50% of opportunties with max A multimodal cues. SLP Short Term Goal 4 (Week 1): Pt will communicate wants/needs at word level (mouthing word or written) with max A multimodal cues in 50% of opportunties. SLP Short Term Goal 5 (Week 1): Pt will tolerate PMSV for 30 minutes without s/s of distress. SLP Short Term Goal 6 (Week 1): Pt will consume trials of ice chips with moderate s/s aspiration noted.  Skilled Therapeutic Interventions:   Patient seen for skilled ST session focusing on speech/PMV and basic level cognitive goals. Patient had audible secretions at level of trach, and required deep suction from RN. After this, patient sounded much better and was significantly more alert, abruptly sitting upright in bed. She tolerated PMV valve for increments of 3-5 minutes two times without changes in oxygen saturations or overt changes in respirations. She did briefly attempt phonation when cued by SLP, however only low intensity glottal sound was achieved and for duration of 1 second or less. Patient was extremely restless and fidgety, pulling sheets off and throwing onto chair nearby, squeezing at, gently tugging at PEG tube and trach collar tubing but not seeming to attempt to pull out. MD came in room and patient able to follow basic one-step commands. PO's not attempted today secondary to patient's inability  to attend. Patient continues to benefit from skilled SLP intervention to maximize speech, swallow, cognitive function prior to discharge.  Pain Pain Assessment Pain Scale: Faces Faces Pain Scale: No hurt  Therapy/Group: Individual Therapy  Sonia Baller, MA, CCC-SLP Speech Therapy

## 2020-04-25 NOTE — Progress Notes (Signed)
Physical Therapy Session Note  Patient Details  Name: Stacy Moran MRN: [id number] Date of Birth: May 03, 1955  Today's Date: 04/25/2020 PT Individual Time: 1115-1200 + 1500-1530 PT Individual Time Calculation (min): 45 min  + 30 min  Short Term Goals: Week 1:  PT Short Term Goal 1 (Week 1): Patient will complete bed mobility with CGA consistently PT Short Term Goal 2 (Week 1): Patient will be able to transfer bed<> wc with LRAD and CGA consistently PT Short Term Goal 3 (Week 1): Patient will tolerate sitting up in wc for at least 30 mins outside of therapy with appropriate safety awareness noted PT Short Term Goal 4 (Week 1): Patient will initiate gait training with LRAD  Skilled Therapeutic Interventions/Progress Updates:     1st session: Pt received supine in bed, agreeable to therapy. On 4L trach collar. Shakes head 'no' to pain. Pt undressed in bed. Retrieved scrub pants and shirt. Required totalA for threading pant legs in bed. She began to sit at the EOB prior to pants being over hips but allowed this to reduce agitation. She was attempting to stand without socks/shoes and began putting these on with totalA but patient was attempting to stand while therapist was applying socks, despite cueing. Required minA for sit<>stand (primarily for initation and forward weight shift) and she was able to use both arms to pull pants over hips. Stand<>pivot with min/modA from EOB to w/c and connected to portable oxygen tank on 4L via trach collar. She required modA for donning scrub shirt with motor apraxia noted when attempting to thread arms through shirt and pulling over head. Noted patient to be moderately congested with mucus at trach site. RN notified who arrived for deep suctioning.   W/c transport to dayroom gym with [place] for time management.   Stand<>pivot with minA from w/c to mat table, requiring multi-modal cues for initation and sequencing. Able to sit EOM with SBA. Noted patient to be  figdity during session, frequently pulling/grabbing the portable oxygen line/tank, restraints, and trach tubing and mild impulsive with standing prior to instruction.  Performed pre-gait training with RW and minA, static standing marching with focus on foot clearance, standing tolerance/endurance, and BLE coordination. SpO2 assessed reading 100% with HR 90 after completion. Began to initiate gait training, ambulated ~72ft with minA and RW with chair follow for safety. Pt required mod cues RW management and safety awareness. During this 63ft of gait, pt was incontinent of bladder without warning. Required minA for sitting back to w/c while therapist and tech cleaned up the floor.  Returned to her room with totalA in w/c. Stand<>pivot with min/modA from w/c to EOB and required modA for initating returning to supine position. Pelvic restraint donned, HOB elevated, and bed lowered. NT and RN made aware of incontinent episode and NT present to assist with cleaning.   2nd session: Pt received sleeping in bed, awakens to voice but difficult to maintain alertness. Ultimately, agreeable to therapy. Supine<>sit with modA for initiation. Able to sit EOB with SBA and donned nonskid socks with totalA for time management. Connected to 4L portable oxygen on trach collar. Sit<>stand with modA for initiation to RW.   Gait training 2x96ft (seated rest) with minA +2 for line management (PEG, oxygen) and chair follow for safety. Patient with significantly poor attention, grabbing objects in hallways, grabbing oxygen tubing and PEG tubing. Benefits from hand-over-hand assist to RW to prevent reaching. Cues for RW management, safety awareness, improving proximity to RW, and increasing bilateral step length.  Significant unsteadiness present.  Stand<>sit with minA +2 to EOB and sit>supine with modA for initiation. She remained seated in bed with HOB fully elevated to 64deg for pulmonary toileting. Bed alarm on and pelvic  restraint donned. RN notified of patient's mobility at end of sssion.   Therapy Documentation Precautions:  Precautions Precautions: Fall Precaution Comments: trach 28%/5L; pelvic restraint in bed, PMSV with staff, very low voice quality Required Braces or Orthoses: Other Brace Other Brace: abdominal binder - for PEG Restrictions Weight Bearing Restrictions: No  Therapy/Group: Individual Therapy  Stacy Moran P Stacy Moran PT 04/25/2020, 7:28 AM

## 2020-04-26 DIAGNOSIS — I609 Nontraumatic subarachnoid hemorrhage, unspecified: Secondary | ICD-10-CM

## 2020-04-26 LAB — GLUCOSE, CAPILLARY
Glucose-Capillary: 156 mg/dL — ABNORMAL HIGH (ref 70–99)
Glucose-Capillary: 142 mg/dL — ABNORMAL HIGH (ref 70–99)
Glucose-Capillary: 130 mg/dL — ABNORMAL HIGH (ref 70–99)
Glucose-Capillary: 157 mg/dL — ABNORMAL HIGH (ref 70–99)
Glucose-Capillary: 130 mg/dL — ABNORMAL HIGH (ref 70–99)

## 2020-04-26 MED ORDER — LORAZEPAM 0.5 MG PO TABS
0.5000 mg | ORAL_TABLET | Freq: Four times a day (QID) | ORAL | Status: DC | PRN
  Administered 2020-04-29 – 2020-05-09 (×8): 0.5 mg via ORAL
  Filled 2020-04-26 (×9): qty 1

## 2020-04-26 MED ORDER — LORAZEPAM 2 MG/ML IJ SOLN: 0.5000 mg | Freq: Four times a day (QID) | INTRAMUSCULAR | Status: DC | PRN

## 2020-04-26 NOTE — Progress Notes (Signed)
Pt has increased agitation this evening; trying to get out of bed,setting off bed alarm, and taking clothes off continually. Unable to settle, despite multiple interventions/diversions offered. Day Reached out to on-call and Seroquel increased to 50mg .

## 2020-04-26 NOTE — Progress Notes (Signed)
[facility] PHYSICAL MEDICINE & REHABILITATION PROGRESS NOTE  Subjective/Complaints: Eyes closed but follows simple commands, grips, wiggles toes   ROS: Limited due to cognition  Objective: Vital Signs: Blood pressure 112/78, pulse 77, temperature 98.4 F (36.9 C), resp. rate 18, height 5\' 9"  (1.753 m), weight 59.3 kg, SpO2 100 %. No results found. No results for input(s): WBC, HGB, HCT, PLT in the last 72 hours. No results for input(s): NA, K, CL, CO2, GLUCOSE, BUN, CREATININE, CALCIUM in the last 72 hours.  Intake/Output Summary (Last 24 hours) at 04/26/2020 0615 Last data filed at 04/25/2020 2000 Gross per 24 hour  Intake 0 ml  Output -  Net 0 ml        Physical Exam: BP 112/78   Pulse 77   Temp 98.4 F (36.9 C)   Resp 18   Ht 5\' 9"  (1.753 m)   Wt 59.3 kg   SpO2 100%   BMI 19.31 kg/m  Constitutional: No distress . Vital signs reviewed. HENT:  Scalp with VPS Neck: + #6 cuffless trach with trach collar  General: No acute distress Mood and affect are appropriate Heart: Regular rate and rhythm no rubs murmurs or extra sounds Lungs: Clear to auscultation, breathing unlabored, no rales or wheezes Abdomen: Positive bowel sounds, soft nontender to palpation, nondistended Extremities: No clubbing, cyanosis, or edema Skin: No evidence of breakdown, no evidence of rash, Gtube and trach site CDI   Motor: 4/5 throughout,  Assessment/Plan: 1. Functional deficits which require 3+ hours per day of interdisciplinary therapy in a comprehensive inpatient rehab setting.  Physiatrist is providing close team supervision and 24 hour management of active medical problems listed below.  Physiatrist and rehab team continue to assess barriers to discharge/monitor patient progress toward functional and medical goals   Care Tool:  Bathing    Body parts bathed by patient: Right arm,Left arm,Chest,Abdomen,Right upper leg,Left upper leg,Right lower leg,Left lower leg,Face   Body  parts bathed by helper: Front perineal area,Buttocks     Bathing assist Assist Level: Moderate Assistance - Patient 50 - 74%     Upper Body Dressing/Undressing Upper body dressing   What is the patient wearing?: Pull over shirt    Upper body assist Assist Level: Maximal Assistance - Patient 25 - 49%    Lower Body Dressing/Undressing Lower body dressing      What is the patient wearing?: Incontinence brief,Pants     Lower body assist Assist for lower body dressing: Maximal Assistance - Patient 25 - 49%     Toileting Toileting    Toileting assist Assist for toileting: Total Assistance - Patient < 25%     Transfers Chair/bed transfer  Transfers assist     Chair/bed transfer assist level: Moderate Assistance - Patient 50 - 74%     Locomotion Ambulation   Ambulation assist      Assist level: Minimal Assistance - Patient > 75% Assistive device: Hand held assist Max distance: 2   Walk 10 feet activity   Assist  Walk 10 feet activity did not occur: Refused        Walk 50 feet activity   Assist Walk 50 feet with 2 turns activity did not occur: Refused         Walk 150 feet activity   Assist Walk 150 feet activity did not occur: Refused         Walk 10 feet on uneven surface  activity   Assist Walk 10 feet on uneven surfaces activity did  not occur: Sales promotion account executive Will patient use wheelchair at discharge?: Yes Type of Wheelchair: Manual Wheelchair activity did not occur: Refused         Wheelchair 50 feet with 2 turns activity    Assist            Wheelchair 150 feet activity     Assist           Medical Problem List and Plan: 1. 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 PT, OT, SLP 2.  Antithrombotics: -DVT/anticoagulation: Subcutaneous heparin             -antiplatelet therapy: N/A 3. Pain Management: Tylenol as needed 4. Mood: Provide emotional support             -antipsychotic agents: Seroquel 25 mg twice daily   Noted to be agitated at times with therapies, will continue to monitor             -pt is fall risk 5. Neuropsych: This patient is not capable of making decisions on her own behalf.  Will consider Ritalin if patient continues to be distracted. 6. Skin/Wound Care: Routine skin checks 7. Fluids/Electrolytes/Nutrition: Routine in and outs  BMP ordered for Monday 8. Seizure prophylaxis.   Keppra decreased to 500 BID on 1/28 9. 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 Constance Holster.              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 Earleen Newport.       Dietary and speech therapy follow-up  Advance diet as tolerated 11. Hyperglycemia related to tube feeds. SSI  Slightly labile on 1/28 12.  Hypothyroidism: Synthroid 13. Hypertension. Hydralazine 50 mg every 8 hours, Normodyne 300 mg 3 times daily, Norvasc 10 mg daily   Vitals:   04/25/20 2048 04/26/20 0351  BP: (!) 159/89 112/78  Pulse: 86 77  Resp: 18 17  Temp: 98.4 F (36.9 C) 98.6 F (37 C)  SpO2: 100% 100%    Monitor with increased mobility  LOS: 4 days A FACE TO FACE EVALUATION WAS PERFORMED  Charlett Blake 04/26/2020, 6:15 AM

## 2020-04-26 NOTE — Plan of Care (Signed)
  Problem: RH BLADDER ELIMINATION Goal: RH STG MANAGE BLADDER WITH ASSISTANCE Description: STG Manage Bladder With min Assistance Outcome: Not Progressing; incontinence   Problem: RH SAFETY Goal: RH STG ADHERE TO SAFETY PRECAUTIONS W/ASSISTANCE/DEVICE Description: STG Adhere to Safety Precautions With  cues/reminders Assistance/Device. Outcome: Not Progressing; will need telesitter not avail at this time; pt has periods of confusion and pulls trach; responding to mitts at this time   Problem: RH KNOWLEDGE DEFICIT Goal: RH STG INCREASE KNOWLEDGE OF DIABETES Description: Patient and daughter will be able to manage DM with medications and dietary modifications using handouts and education materials Outcome: Not Progressing; confused

## 2020-04-26 NOTE — Progress Notes (Signed)
Asked to reevaluate patient about possibility of downsizing tracheostomy.  She has a 7.5 mm inner diameter tracheostomy in place.  She breathing very well.  Due to her cognitive status she is not able to communicate or respond to questions.  I cannot evaluate her speech.  Tracheostomy looks very stable so I think downsizing is reasonable.  She can be downsized to a standard Shiley 6 and then possibly a #4 cuffless.  If she tolerates that she can be decannulated.  I can assist with that in the future but for now I would start with downsizing to a cuffless #6.  The tracheostomy is mature and respiratory therapy can do this initial change.

## 2020-04-26 NOTE — Progress Notes (Addendum)
Received patient asleep; was able to do meds and trach care; patient has a shilley #6per report; RT was notified that the shilley in room was #6 but when trach care done it is not the same ;RN cleansed and dried trach.  Patient got agitated x1; MD was notified no prn meds at this time; Patient was pulling trach; ordered for telesitter but no camera available at this time. RN placed mitts. Patient calmed down RN and NT was able to change pt; RN was able to suction patient. Patient went back to sleep.

## 2020-04-27 LAB — GLUCOSE, CAPILLARY
Glucose-Capillary: 112 mg/dL — ABNORMAL HIGH (ref 70–99)
Glucose-Capillary: 116 mg/dL — ABNORMAL HIGH (ref 70–99)
Glucose-Capillary: 119 mg/dL — ABNORMAL HIGH (ref 70–99)
Glucose-Capillary: 130 mg/dL — ABNORMAL HIGH (ref 70–99)
Glucose-Capillary: 137 mg/dL — ABNORMAL HIGH (ref 70–99)
Glucose-Capillary: 138 mg/dL — ABNORMAL HIGH (ref 70–99)

## 2020-04-27 LAB — URINALYSIS, ROUTINE W REFLEX MICROSCOPIC
Bilirubin Urine: NEGATIVE
Glucose, UA: NEGATIVE mg/dL
Hgb urine dipstick: NEGATIVE
Ketones, ur: NEGATIVE mg/dL
Leukocytes,Ua: NEGATIVE
Nitrite: NEGATIVE
Protein, ur: NEGATIVE mg/dL
Specific Gravity, Urine: 1.014 (ref 1.005–1.030)
pH: 5 (ref 5.0–8.0)

## 2020-04-27 NOTE — Progress Notes (Signed)
Speech Language Pathology Daily Session Note  Patient Details  Name: Stacy Moran MRN: [id number] Date of Birth: 1955/10/13  Today's Date: 04/27/2020 SLP Individual Time: 1157-2620 SLP Individual Time Calculation (min): 34 min  Short Term Goals: Week 1: SLP Short Term Goal 1 (Week 1): Pt will demonstrate focused attention in 1 minute intervals with max A multimodal cues in 50% of opportunties. SLP Short Term Goal 2 (Week 1): Pt will follow 1 step commands with max A multimodal cues in 50% of opportunties. SLP Short Term Goal 3 (Week 1): Pt will respond to yes/no questions with 80% accuarcy in 50% of opportunties with max A multimodal cues. SLP Short Term Goal 4 (Week 1): Pt will communicate wants/needs at word level (mouthing word or written) with max A multimodal cues in 50% of opportunties. SLP Short Term Goal 5 (Week 1): Pt will tolerate PMSV for 30 minutes without s/s of distress. SLP Short Term Goal 6 (Week 1): Pt will consume trials of ice chips with moderate s/s aspiration noted.  Skilled Therapeutic Interventions:  Pt was seen for skilled ST targeting goals for functional communication and dysphagia.  Upon arrival, pt had her eyes closed and did not respond to voice or light touch; however, she pushed therapist hand away with attempts to apply cold compress to forehead to maximize alertness.  Pt was repositioned in an attempt to improve alertness for participation in treatment.  Pt allowed oral care of her teeth but would not open her mouth wide enough to allow cleaning of her tongue.  SLP donned PMSV for ~20 minutes of today's session with no appreciable changes in vital signs or overt s/s of discomfort but pt made no attempts to communicate with therapist despite max to total cues during structured vowel production or singing tasks and she kept her eyes closed throughout session.  When asked if she was tired, pt shook her head "no."  SLP attempted trials of ice chips to continue working  towards readiness for PO diet initiation; however, pt did not accept any boluses offered.  She pursed her lips and turned her head away from therapist.  Given pt's reluctance to participate meaningfully in treatment, session was ended early.  Pt was left in bed with waist belt applied, bed alarm set, and call bell within reach.  Continue per current plan of care.    Pain Pain Assessment Pain Scale: Faces Faces Pain Scale: No hurt  Therapy/Group: Individual Therapy  Mirai Greenwood, Selinda Orion 04/27/2020, 3:37 PM

## 2020-04-27 NOTE — Progress Notes (Signed)
[facility] PHYSICAL MEDICINE & REHABILITATION PROGRESS NOTE  Subjective/Complaints: Appreciate ENT note, per RN only gets agitated with suctiopning or when pt needs to void   ROS: Limited due to cognition  Objective: Vital Signs: Blood pressure (!) 147/81, pulse 84, temperature 98.3 F (36.8 C), resp. rate 18, height 5\' 9"  (1.753 m), weight 59.3 kg, SpO2 100 %. No results found. No results for input(s): WBC, HGB, HCT, PLT in the last 72 hours. No results for input(s): NA, K, CL, CO2, GLUCOSE, BUN, CREATININE, CALCIUM in the last 72 hours.  Intake/Output Summary (Last 24 hours) at 04/27/2020 0658 Last data filed at 04/26/2020 2100 Gross per 24 hour  Intake 0 ml  Output 2 ml  Net -2 ml        Physical Exam: BP (!) 147/81 (BP Location: Left Leg)   Pulse 84   Temp 98.3 F (36.8 C)   Resp 18   Ht 5\' 9"  (1.753 m)   Wt 59.3 kg   SpO2 100%   BMI 19.31 kg/m  Constitutional: No distress . Vital signs reviewed. HENT:  Scalp with VPS Neck: + #6 cuffless trach with trach collar  General: No acute distress Mood and affect are appropriate Heart: Regular rate and rhythm no rubs murmurs or extra sounds Lungs: Clear to auscultation, breathing unlabored, no rales or wheezes Abdomen: Positive bowel sounds, soft nontender to palpation, nondistended Extremities: No clubbing, cyanosis, or edema Skin: No evidence of breakdown, no evidence of rash  Skin: No evidence of breakdown, no evidence of rash, Gtube and trach site CDI   Motor: 4/5 throughout,  Assessment/Plan: 1. Functional deficits which require 3+ hours per day of interdisciplinary therapy in a comprehensive inpatient rehab setting.  Physiatrist is providing close team supervision and 24 hour management of active medical problems listed below.  Physiatrist and rehab team continue to assess barriers to discharge/monitor patient progress toward functional and medical goals   Care Tool:  Bathing    Body parts bathed by  patient: Right arm,Left arm,Chest,Abdomen,Right upper leg,Left upper leg,Right lower leg,Left lower leg,Face   Body parts bathed by helper: Front perineal area,Buttocks     Bathing assist Assist Level: Moderate Assistance - Patient 50 - 74%     Upper Body Dressing/Undressing Upper body dressing   What is the patient wearing?: Pull over shirt    Upper body assist Assist Level: Maximal Assistance - Patient 25 - 49%    Lower Body Dressing/Undressing Lower body dressing      What is the patient wearing?: Incontinence brief,Pants     Lower body assist Assist for lower body dressing: Maximal Assistance - Patient 25 - 49%     Toileting Toileting    Toileting assist Assist for toileting: Total Assistance - Patient < 25%     Transfers Chair/bed transfer  Transfers assist     Chair/bed transfer assist level: Moderate Assistance - Patient 50 - 74%     Locomotion Ambulation   Ambulation assist      Assist level: Minimal Assistance - Patient > 75% Assistive device: Hand held assist Max distance: 2   Walk 10 feet activity   Assist  Walk 10 feet activity did not occur: Refused        Walk 50 feet activity   Assist Walk 50 feet with 2 turns activity did not occur: Refused         Walk 150 feet activity   Assist Walk 150 feet activity did not occur: Refused  Walk 10 feet on uneven surface  activity   Assist Walk 10 feet on uneven surfaces activity did not occur: Refused         Wheelchair     Assist Will patient use wheelchair at discharge?: Yes Type of Wheelchair: Manual Wheelchair activity did not occur: Refused         Wheelchair 50 feet with 2 turns activity    Assist            Wheelchair 150 feet activity     Assist           Medical Problem List and Plan: 1. 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 PT, OT, SLP 2.  Antithrombotics: -DVT/anticoagulation: Subcutaneous heparin             -antiplatelet therapy: N/A 3. Pain Management: Tylenol as needed 4. Mood: Provide emotional support             -antipsychotic agents: Seroquel 25 mg twice daily   Noted to be agitated at times with therapies, will continue to monitor             -pt is fall risk 5. Neuropsych: This patient is not capable of making decisions on her own behalf.  Will consider Ritalin if patient continues to be distracted. 6. Skin/Wound Care: Routine skin checks 7. Fluids/Electrolytes/Nutrition: Routine in and outs  BMP ordered for Monday 8. Seizure prophylaxis.   Keppra decreased to 500 BID on 1/28 9. 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 Constance Holster.              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 Earleen Newport.     Gastrostomy site looks good 1/30  Dietary and speech therapy follow-up  Advance diet as tolerated 11. Hyperglycemia related to tube feeds. SSI   CBG (last 3)  Recent Labs    04/26/20 2016 04/27/20 0026 04/27/20 0436  GLUCAP 130* 137* 119*  CBGs cont  12.  Hypothyroidism: Synthroid 13. Hypertension. Hydralazine 50 mg every 8 hours, Normodyne 300 mg 3 times daily, Norvasc 10 mg daily   Vitals:   04/27/20 0320 04/27/20 0437  BP:  (!) 147/81  Pulse: 90 84  Resp: 18 18  Temp:  98.3 F (36.8 C)  SpO2: 96% 100%  BP ok this am   Monitor with increased mobility  LOS: 5 days A FACE TO FACE EVALUATION WAS PERFORMED  Stacy Moran 04/27/2020, 6:58 AM

## 2020-04-27 NOTE — Progress Notes (Signed)
Physical Therapy Session Note  Patient Details  Name: Stacy Moran MRN: [id number] Date of Birth: [birth year]  Today's Date: 04/27/2020 PT Individual Time: 1540-1640 PT Individual Time Calculation (min): 60 min   Short Term Goals: Week 1:  PT Short Term Goal 1 (Week 1): Patient will complete bed mobility with CGA consistently PT Short Term Goal 2 (Week 1): Patient will be able to transfer bed<> wc with LRAD and CGA consistently PT Short Term Goal 3 (Week 1): Patient will tolerate sitting up in wc for at least 30 mins outside of therapy with appropriate safety awareness noted PT Short Term Goal 4 (Week 1): Patient will initiate gait training with LRAD  Skilled Therapeutic Interventions/Progress Updates:    Pt received supine in bed with RT arriving for deep suction and RN notified to disconnect tube feedings. Pt nonverbal throughout session but responds appropriately to questions via yes/no head nods. Pt requires encouragement for participation when therapist starting to uncover her legs to assist to EOB pt starts to cover her legs back up but with minor encouragement and redirection pt agreeable to therapy session. Received on 5L of O2 28% FiO2 via trach collar and maintained on that or transitioned to 6L on portable tank with continuous HR and SpO2 monitoring. At beginning of session at rest SpO2 91% and HR 78bpm. Supine>sitting L EOB with mod assist primarily due to still encouraging pt for OOB mobility at this time. Sitting EOB with close supervision for trunk control due to intermittent posterior lean with pt using B UE support to regain balance while donning pants max assist - pt able to lift LEs to assist with threading. Sit>stand EOB>RW with min/mod assist for lifting/balance and pulled pants over hips total assist. RN requesting therapist to assist pt with toileting as they had to I&O cath her earlier - therapist requested that nursing assist pt to toilet prior to I&O to attempt continent void  - therapist updated safety plan. Gait training ~52ft to bathroom using RW with heavy mod assist of 1 for AD management and balance with +2 assist for line management - pt pushes AD too far forward with poor awareness and increased hip flexion causing forward trunk lean along with decreased B LE step lengths and decreased LE coordination when stepping - requires increased assistance and 1-step cuing to motor plan and sequence turning to sit on BSC over toilet. Standing with heavy min assist for balance while pulling down LB clothing with pt attempting to pull pants back up at same time requiring cuing to correct. Continent of bladder and performed seated peri-care with min assist to locate toilet paper. Attempted gait training ~58ft back to room using HHA as opposed to RW due to pt difficulty motor planning how to safely use RW; however, without AD pt demos worsening trunk/hip flexion with posterior lean and pt reaching all around to grasp onto objects in room for balance as pt appears to be very fearful ambulating in this manner. Standing hand hygiene at sink with mod assist for balance as pt continues to have excessive trunk/hip flexion with forward lean requiring cuing and assist for erect posture to allow pt to get closer to sink and wash hands with hand-over-hand assist to locate soap and paper towels.   Transported to/from gym in w/c for time management and energy conservation. Gait training ~166ft 2x using RW with heavy mod assist of 1 for RW management and balance with +2 assist for O2 and continuous monitor line management - continues to  demo poor AD management pushing it too far forward with excessive trunk/hip flexion requiring repeated cuing to "stand tall" along with impaired B LE coordination with decreased step lengths and foot clearances - L LE snapping back into hyperextension ~50% of the time during stance (therapist blocking). At end of each gait when turning to sit back in w/c required max  assist of 1 and min assist of +2 to assist with AD and pt's balance due to impaired motor planning and decreased safety awareness with pt having worsening trunk/hip flexion and posterior lean with worsening forward push of RW - max step-by-step cuing for improved motor planning and increased safety. SpO2 98-100% while ambulating but then when turning while walking or resting in w/c after ambulating SpO2 88-91%. HR 78-87bpm  Transported back to room to provide external trach suction due to thick secretions coming out then back to gym. Repeated R/L LE step up/down on/off 1st 6" step using B HR support with min/mod assist of 1 - pt with poor understanding of task stepping up with either LE despite cuing to lead with L LE for NMR and strengthening x10 reps. Transported back to room and pt requesting to remain up in w/c - left with needs in reach, waist belt restraint donned, lines intact, and NT aware of pt's position.  Therapy Documentation Precautions:  Precautions Precautions: Fall Precaution Comments: trach 28%/5L; pelvic restraint in bed, PMSV with staff, very low voice quality Required Braces or Orthoses: Other Brace Other Brace: abdominal binder - for PEG Restrictions Weight Bearing Restrictions: No  Pain:   No indications of pain during session.    Therapy/Group: Individual Therapy  Tawana Scale , PT, DPT, CSRS  04/27/2020, 3:10 PM

## 2020-04-27 NOTE — Progress Notes (Signed)
Occupational Therapy Session Note  Patient Details  Name: Stacy Moran MRN: [id number] Date of Birth: 1955-07-20  Today's Date: 04/27/2020 OT Individual Time: 1400-1450 OT Individual Time Calculation (min): 50 min  and Today's Date: 04/27/2020 OT Missed Time: 10 Minutes Missed Time Reason: Patient fatigue   Short Term Goals: Week 1:  OT Short Term Goal 1 (Week 1): Pt will sit EOB with no LOB during 1 ADL task OT Short Term Goal 2 (Week 1): Pt will complete bathroom transfer with min HHA OT Short Term Goal 3 (Week 1): Pt will complete LB dressing with min A OT Short Term Goal 4 (Week 1): Pt will require no more than min cueing for focused attention to ADL task  Skilled Therapeutic Interventions/Progress Updates:    Pt received supine in bed with her eyes closed, resting on 28% FiO2 on trach collar and peg tube running. Pt shaking her head yes when asked if she would like to put on her personal nightgown and use the bathroom. She transferred to the EOB with CGA. She was internally and externally distracted throughout session and required frequent redirection to task. With mod HHA pt completed ambulatory transfer into the bathroom with extra time d/t frequent distractions/fiddling with multiple things along the way. Pt became more disoriented with trying to turn and +2 was helpful for second HHA to guide pt around. Pt voided urine and BM and was able to complete peri hygiene seated. A new brief was donned with mod A, obvious motor planning deficits present. Pt completed functional mobility to the sink with mod A and washed her hands with mod cueing for task termination. Pt sat in her w/c and completed oral care via the suction toothbrush with min A. Slight red tint to saliva. Spo2 reconnected and pt at 88-90% Spo2 with consistent wave length. Pt given several minutes rest break and had no change in saturation. Re-donned trach collar with 5 L 28% FiO2 and pt still had no rebound higher than 91%.  Titrated O2 to 6L, 35% FiO2 and she rebounded within 1 minute to 94%. She was then able to remain at >94% with the O2 titrated back down to 5L 28% FiO2. LPN present for this as well and completed some shallow suctioning. Pt declined any further activity and returned to bed. She was left with the waist restraint on, peg tube attached and running, bed alarm set, and trach collar at aforementioned level.   Therapy Documentation Precautions:  Precautions Precautions: Fall Precaution Comments: trach 28%/5L; pelvic restraint in bed, PMSV with staff, very low voice quality Required Braces or Orthoses: Other Brace Other Brace: abdominal binder - for PEG Restrictions Weight Bearing Restrictions: No Therapy/Group: Individual Therapy  Curtis Sites 04/27/2020, 6:23 AM

## 2020-04-28 ENCOUNTER — Encounter (HOSPITAL_COMMUNITY): Payer: Self-pay | Admitting: Physical Medicine & Rehabilitation

## 2020-04-28 ENCOUNTER — Other Ambulatory Visit: Payer: Self-pay

## 2020-04-28 LAB — BASIC METABOLIC PANEL
Anion gap: 10 (ref 5–15)
BUN: 23 mg/dL (ref 8–23)
CO2: 32 mmol/L (ref 22–32)
Calcium: 10 mg/dL (ref 8.9–10.3)
Chloride: 98 mmol/L (ref 98–111)
Creatinine, Ser: 0.59 mg/dL (ref 0.44–1.00)
GFR, Estimated: >60 mL/min (ref >60)
Glucose, Bld: 126 mg/dL — ABNORMAL HIGH (ref 70–99)
Potassium: 4.4 mmol/L (ref 3.5–5.1)
Sodium: 140 mmol/L (ref 135–145)

## 2020-04-28 LAB — GLUCOSE, CAPILLARY
Glucose-Capillary: 121 mg/dL — ABNORMAL HIGH (ref 70–99)
Glucose-Capillary: 128 mg/dL — ABNORMAL HIGH (ref 70–99)
Glucose-Capillary: 151 mg/dL — ABNORMAL HIGH (ref 70–99)
Glucose-Capillary: 160 mg/dL — ABNORMAL HIGH (ref 70–99)
Glucose-Capillary: 55 mg/dL — ABNORMAL LOW (ref 70–99)
Glucose-Capillary: 84 mg/dL (ref 70–99)
Glucose-Capillary: 89 mg/dL (ref 70–99)
Glucose-Capillary: 97 mg/dL (ref 70–99)

## 2020-04-28 LAB — URINE CULTURE: Culture: NO GROWTH

## 2020-04-28 NOTE — Significant Event (Signed)
Hypoglycemic Event  CBG: 55   Treatment: Reconnected tube feed  Symptoms: None  Follow-up CBG: Time:1317 CBG Result:89  Possible Reasons for Event: Other: Tube feed disconnected for therapy  Comments/MD notified:Dan Angiulli, PA-C notified, no new orders. Reconnected continuous tube feed.    Creig Hines, Susa Raring

## 2020-04-28 NOTE — Progress Notes (Signed)
[facility] PHYSICAL MEDICINE & REHABILITATION PROGRESS NOTE  Subjective/Complaints: Patient seen sitting up in bed this AM.  No reported issues overnight.  Notes to be restless.   ROS: Limited due to cognition.  Objective: Vital Signs: Blood pressure 126/90, pulse 80, temperature 98.9 F (37.2 C), resp. rate 14, height 5\' 9"  (1.753 m), weight 59.3 kg, SpO2 100 %. No results found. No results for input(s): WBC, HGB, HCT, PLT in the last 72 hours. Recent Labs    04/28/20 0455  NA 140  K 4.4  CL 98  CO2 32  GLUCOSE 126*  BUN 23  CREATININE 0.59  CALCIUM 10.0    Intake/Output Summary (Last 24 hours) at 04/28/2020 0931 Last data filed at 04/27/2020 1834 Gross per 24 hour  Intake 0 ml  Output 500 ml  Net -500 ml        Physical Exam: BP 126/90   Pulse 80   Temp 98.9 F (37.2 C)   Resp 14   Ht 5\' 9"  (1.753 m)   Wt 59.3 kg   SpO2 100%   BMI 19.31 kg/m  Constitutional: No distress . Vital signs reviewed. HENT: Normocephalic.  Atraumatic. Scalp with VPS shunt Neck: + #6 cuffless trach with trach collar Eyes: EOMI. No discharge. Cardiovascular: No JVD.  RRR. Respiratory: Normal effort.  No stridor.  Bilateral clear to auscultation. GI: Non-distended.  BS +. +PEG.  Skin: Warm and dry.  Intact. Psych: Normal mood.  Normal behavior. Musc: No edema in extremities.  No tenderness in extremities. Motor: 4+/5 throughout  Assessment/Plan: 1. Functional deficits which require 3+ hours per day of interdisciplinary therapy in a comprehensive inpatient rehab setting.  Physiatrist is providing close team supervision and 24 hour management of active medical problems listed below.  Physiatrist and rehab team continue to assess barriers to discharge/monitor patient progress toward functional and medical goals   Care Tool:  Bathing    Body parts bathed by patient: Right arm,Left arm,Chest,Abdomen,Right upper leg,Left upper leg,Right lower leg,Left lower leg,Face   Body  parts bathed by helper: Front perineal area,Buttocks     Bathing assist Assist Level: Moderate Assistance - Patient 50 - 74%     Upper Body Dressing/Undressing Upper body dressing   What is the patient wearing?: Pull over shirt    Upper body assist Assist Level: Maximal Assistance - Patient 25 - 49%    Lower Body Dressing/Undressing Lower body dressing      What is the patient wearing?: Incontinence brief,Pants     Lower body assist Assist for lower body dressing: Maximal Assistance - Patient 25 - 49%     Toileting Toileting    Toileting assist Assist for toileting: Total Assistance - Patient < 25%     Transfers Chair/bed transfer  Transfers assist     Chair/bed transfer assist level: Moderate Assistance - Patient 50 - 74%     Locomotion Ambulation   Ambulation assist      Assist level: 2 helpers (mod A of 1 and +2 line management) Assistive device: Walker-rolling Max distance: 193ft   Walk 10 feet activity   Assist  Walk 10 feet activity did not occur: Refused  Assist level: 2 helpers (mod A of 1 and +2 line management) Assistive device: Walker-rolling   Walk 50 feet activity   Assist Walk 50 feet with 2 turns activity did not occur: Refused  Assist level: 2 helpers (mod A of 1 and +2 line management) Assistive device: Walker-rolling    Walk 150  feet activity   Assist Walk 150 feet activity did not occur: Refused         Walk 10 feet on uneven surface  activity   Assist Walk 10 feet on uneven surfaces activity did not occur: Refused         Wheelchair     Assist Will patient use wheelchair at discharge?: Yes Type of Wheelchair: Manual Wheelchair activity did not occur: Refused         Wheelchair 50 feet with 2 turns activity    Assist            Wheelchair 150 feet activity     Assist           Medical Problem List and Plan: 1. 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  Antithrombotics: -DVT/anticoagulation: Subcutaneous heparin             -antiplatelet therapy: N/A 3. Pain Management: Tylenol as needed 4. Mood: Provide emotional support             -antipsychotic agents: Seroquel 25 mg twice daily   Noted to be agitated at times with therapies, will continue to monitor             -pt is fall risk 5. Neuropsych: This patient is not capable of making decisions on her own behalf.  Will consider Ritalin if patient continues to be distracted. 6. Skin/Wound Care: Routine skin checks 7. Fluids/Electrolytes/Nutrition: Routine in and outs  BMP within acceptable range on 1/31 8. Seizure prophylaxis.   Keppra decreased to 500 BID on 1/28, will continue wean 9. 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 Constance Holster.   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 Earleen Newport.       Dietary and speech therapy follow-up  Advanced diet as tolerated 11. Hyperglycemia related to tube feeds. SSI   CBG (last 3)  Recent Labs    04/28/20 0108 04/28/20 0416 04/28/20 0847  GLUCAP 128* 97 151*   Slightly labile on 1/31 12. Hypothyroidism: Syndthroid 44. Hypertension. Hydralazine 50 mg every 8 hours, Normodyne 300 mg 3 times daily, Norvasc 10 mg daily   Vitals:   04/28/20 0457 04/28/20 0759  BP: 126/90   Pulse: 81 80  Resp: 18 14  Temp: 98.9 F (37.2 C)   SpO2: 100% 100%   Controlled on 1/31  Monitor with increased mobility  LOS: 6 days A FACE TO FACE EVALUATION WAS PERFORMED  Zhoey Blackstock Lorie Phenix 04/28/2020, 9:31 AM

## 2020-04-28 NOTE — Progress Notes (Signed)
Occupational Therapy Note  Patient Details  Name: Stacy Moran MRN: [id number] Date of Birth: 18-Nov-1955  Today's Date: 04/28/2020 OT Missed Time: 75 Minutes Missed Time Reason: Patient fatigue;Other (comment);Nursing care (lethargy)  Pt asleep in bed upon OT arrival, trach collar doffed.  OT redonned trach collar and assessed SPO2 with pt on 4L at 28%humidified air and was 98%.  Nurse arrived thereafter to assess pts blood sugar levels due to recent low reading. Pt also required medication administration by nursing.  Attempted to arouse pt after nursing care, however pt did not respond to all attempts, remaining asleep with eyes closed, and did not follow any commands, therefore deferred further skilled OT treatment at this time.     Caryl Asp Vernestine Brodhead 04/28/2020, 3:03 PM

## 2020-04-28 NOTE — Progress Notes (Signed)
Physical Therapy Session Note  Patient Details  Name: Stacy Moran MRN: [id number] Date of Birth: July 05, 1955  Today's Date: 04/28/2020 PT Individual Time: 1100-1158 PT Individual Time Calculation (min): 58 min   Short Term Goals: Week 1:  PT Short Term Goal 1 (Week 1): Patient will complete bed mobility with CGA consistently PT Short Term Goal 2 (Week 1): Patient will be able to transfer bed<> wc with LRAD and CGA consistently PT Short Term Goal 3 (Week 1): Patient will tolerate sitting up in wc for at least 30 mins outside of therapy with appropriate safety awareness noted PT Short Term Goal 4 (Week 1): Patient will initiate gait training with LRAD  Skilled Therapeutic Interventions/Progress Updates:    Pt received sidelying in bed, sleeping with lights off, awakens easily to voice. Coordinated with RN to pause PEG feedings for therapy. On 5L wall O2 via trach collar, saturating at 98% with HR 88 at rest. Removed pelvic restraint. She required motivation for participating with functional tasks and would throw the covers back on herself when therapist was assisting with facilitating functional movement. Required totalA for donning socks. Ultimately, got her sitting EOB with modA for initiation and able to sit EOB with SBA. Provided her scrub pants which she required totalA for threading but was able to pull over hips in standing by herself with minA for standing balance.   Stand<>pivot with minA and no AD from EOB to w/c. Connected to 6L portable tank via trach collar for remainder of session. Assessed oxygen after gait and during functional activities, staying >98% throughout. W/c transport for time management to dayroom gym.   Wheeled her in front of the BITS system to work on [employer] for dynamic standing balance and dual-cog tasks. PT provided demonstration of trail-making sequencing (1 to 2 to 3, etc. Up to 25). Pt nodded head 'yes' when asked if she understood. Sit<>stand with minA to RW and she  was able to initiate at the #1 but unable to locate or draw line to #2. Instead, she would just draw around the screen in nonsequential manner. Deferred to visual pursuit on BITS to attempt functional reaching but again,pt unable to understand instructions of tapping the circles and instead would attempt to tap her error circles instead of target circles.    Focused remainder of session on functional gait training. Ambulated ~80ft with min/modA and RW with +2 assist for chair follow for safety. Pt is extremely distracted by her environment and has very poor safety awareness with the RW. She pushes it too far in front of her, veers L<>R, and sometimes will have 1 foot out of the walker. Frequent cues for correcting gait deficits and RW management but limited carryover noted.   Returned to her room with totalA and stand<>pivot with minA back to bed. Returned to wall O2 of 6L via trach mask. Required modA for returning to supine, primarily for initiation. Donned pelvic restraint and raised HOB up to 64deg for pulmonary toileting and upright tolerance. Bed alarm on and bed rails up. RN notified at end of session of pt's mobility, response to therapy, and copious secretions noted from trach site.     Therapy Documentation Precautions:  Precautions Precautions: Fall Precaution Comments: trach 28%/5L; pelvic restraint in bed, PMSV with staff, very low voice quality Required Braces or Orthoses: Other Brace Other Brace: abdominal binder - for PEG Restrictions Weight Bearing Restrictions: No  Therapy/Group: Individual Therapy  Helmuth Recupero P Stacy Moran PT 04/28/2020, 7:54 AM

## 2020-04-28 NOTE — Progress Notes (Signed)
Speech Language Pathology Daily Session Note  Patient Details  Name: Stacy Moran MRN: [id number] Date of Birth: 14-May-1955  Today's Date: 04/28/2020 SLP Individual Time: 1017-1059 SLP Individual Time Calculation (min): 42 min  Short Term Goals: Week 1: SLP Short Term Goal 1 (Week 1): Pt will demonstrate focused attention in 1 minute intervals with max A multimodal cues in 50% of opportunties. SLP Short Term Goal 2 (Week 1): Pt will follow 1 step commands with max A multimodal cues in 50% of opportunties. SLP Short Term Goal 3 (Week 1): Pt will respond to yes/no questions with 80% accuarcy in 50% of opportunties with max A multimodal cues. SLP Short Term Goal 4 (Week 1): Pt will communicate wants/needs at word level (mouthing word or written) with max A multimodal cues in 50% of opportunties. SLP Short Term Goal 5 (Week 1): Pt will tolerate PMSV for 30 minutes without s/s of distress. SLP Short Term Goal 6 (Week 1): Pt will consume trials of ice chips with moderate s/s aspiration noted.  Skilled Therapeutic Interventions:Skilled ST services focused on speech and language skills. Pt was pleasant and greeted SLP. SLP donned on PMSV, in which pt was unable to phonate, however weak wet whisper was noted. Pt immediately began coughing and was able to expel thick mucus via oral cavity with PMSV in place.SLP educated pt how to utilize suction and pt demonstrated return with ability to follow 1 step commands. Pt demonstrated ability to respond to yes/no questions pertaining to self/enviornment with 100% accuracy in 7 opportunities and to complex yes/no questions with 100% accuracy in 5 opportunities. Pt demonstrated sustained attention for the initial first 25 minutes of treatment session and at this point began falling asleep. Pt's vital signs remained WFL for 30 minutes with PMSV, but once pt began to fall asleep, she sat up and attempted to cough, removing finger sensor. Pt's PMSV was forced off with  coughs and pt was able to cough up thick mucus secretions via trach hub and oral cavity. Pt required max A multimodal cues to utilize suction. Once vital sign monitor was replaced on pt's finger O2 status ready 87 and HR 80. Pt's O2 quickly increased to 98 after pt was able to expel more mucus. SLP notified nurse. SLP also attempted to have pt write during this time because she mouthed a word that SLP was unable to deifer pt refused. SLP recommends PMSV only with ST at this time, but will continue to assess tolerance in 15-20 interval when awake only for other staff members. Pt was left in room with bed alarm set and call bell within reach. Recommend to continue ST services.     Pain Pain Assessment Pain Score: 0-No pain  Therapy/Group: Individual Therapy  Abiel Antrim  [facility] 04/28/2020, 4:35 PM

## 2020-04-29 LAB — GLUCOSE, CAPILLARY
Glucose-Capillary: 92 mg/dL (ref 70–99)
Glucose-Capillary: 163 mg/dL — ABNORMAL HIGH (ref 70–99)
Glucose-Capillary: 124 mg/dL — ABNORMAL HIGH (ref 70–99)
Glucose-Capillary: 129 mg/dL — ABNORMAL HIGH (ref 70–99)
Glucose-Capillary: 132 mg/dL — ABNORMAL HIGH (ref 70–99)

## 2020-04-29 NOTE — Progress Notes (Signed)
Speech Language Pathology Weekly Progress and Session Note  Patient Details  Name: Stacy Moran MRN: [id number] Date of Birth: 09/18/1955  Beginning of progress report period: April 23, 2020 End of progress report period: April 29, 2020  Today's Date: 04/29/2020 SLP Individual Time: 5038-8828 SLP Individual Time Calculation (min): 42 min  Short Term Goals: Week 1: SLP Short Term Goal 1 (Week 1): Pt will demonstrate focused attention in 1 minute intervals with max A multimodal cues in 50% of opportunties. SLP Short Term Goal 1 - Progress (Week 1): Met SLP Short Term Goal 2 (Week 1): Pt will follow 1 step commands with max A multimodal cues in 50% of opportunties. SLP Short Term Goal 2 - Progress (Week 1): Met SLP Short Term Goal 3 (Week 1): Pt will respond to yes/no questions with 80% accuarcy in 50% of opportunties with max A multimodal cues. SLP Short Term Goal 3 - Progress (Week 1): Met SLP Short Term Goal 4 (Week 1): Pt will communicate wants/needs at word level (mouthing word or written) with max A multimodal cues in 50% of opportunties. SLP Short Term Goal 4 - Progress (Week 1): Met SLP Short Term Goal 5 (Week 1): Pt will tolerate PMSV for 30 minutes without s/s of distress. SLP Short Term Goal 5 - Progress (Week 1): Not met SLP Short Term Goal 6 (Week 1): Pt will consume trials of ice chips with moderate s/s aspiration noted. SLP Short Term Goal 6 - Progress (Week 1): Not met    New Short Term Goals: Week 2: SLP Short Term Goal 1 (Week 2): Pt will tolerate PMSV for 30 minutes without s/s of distress over x3 opportunites. SLP Short Term Goal 2 (Week 2): Pt will demonstrate sustained attention in 25 minute intervals with mod A multimodal cues in 3 sessions. SLP Short Term Goal 3 (Week 2): Pt will follow 1 step commands with mod A multimodal cues in 80% of opportunties. SLP Short Term Goal 4 (Week 2): Pt will consume trials of ice chips with moderate s/s aspiration and reduced  expeling of mucus secretions, prior to repeat MBS. SLP Short Term Goal 5 (Week 2): Pt will communicate wants/needs at written word level with mod A verbal cues in 70% of opportunties. SLP Short Term Goal 6 (Week 2): Pt will phonate at the phenome level x2 with max A multimodal cues.  Weekly Progress Updates: Pt made moderate progress meeting 4 out 6 goals. Pt demonstrated increase in sustained attention, basic problem solving, following 1 step commands, identifying objects, naming objects and object function. Pt is still limited in communication by aphonia and reluctant to attempting voicing as well as witting at the word level. Pt requires encouragement and fatigue continues to be a factor with consistent sustained attention/participation. Pt is tolerating PMSV up to 25 minutes with ST only and staff in 10-15 minute intervals due to continued issue with thick secretions. Pt is consuming ice chip trials, however this can increase the amount of secretions expelled and appear to demonstrate s/s of aspiration. SLP continues to recommend NPO. Pt would continue to benefit from skilled ST services in order to maximize functional independence and reduce burden of care, requiring 24 hour supervision at discharge with continued skilled ST services.     Intensity: Minumum of 1-2 x/day, 30 to 90 minutes Frequency: 3 to 5 out of 7 days Duration/Length of Stay: 3 weeks Treatment/Interventions: Cognitive remediation/compensation;Cueing hierarchy;Dysphagia/aspiration precaution training;Functional tasks;Internal/external aids;Speech/Language facilitation;Patient/family education   Daily Session  Skilled Therapeutic Interventions:  Skilled ST services focused on language skills. Pt was asleep but easily awoke. SLP donned on PMSV, in which pt was able to tolerate 25 minutes with x1 episode of coughing (removed briefly after 10 minute interval, during pt's attempt expel mucus via trach hub and oral cavity. Pt's O2 and  HR stats remained WFL. Pt demonstrated improved problem solving and following 1 step commands while completing oral care. Pt was able to complete oral care with suction toothbrush with mod A verbal cues.  Pt demonstrated ability to mouth the name of objects and function 91-2 word level) in  5 out 5 opportunties mod I. Pt was able to name actions/verbs in photographs (LARK) in 2 out 5 opportunities mod I and in 5 out 5 opportunities with mod cues to clarify lip reading as well as witting at the word level. Intelligibility was limited by aphonia and pt required increase encouragement to attempt writing. Pt did demonstrate sustained attention up to 20-25 minutes, however after this pint pt began to fall asleep despite max cuing. SLP removed PMSV and placed signs in room to encourage pt to utilize written communication. Pt was left in room with call bell within reach and bed alarm set. SLP recommends to continue skilled services.    General    Pain Pain Assessment Pain Score: 0-No pain  Therapy/Group: Individual Therapy  Vegas Fritze  [facility] 04/29/2020, 5:06 PM

## 2020-04-29 NOTE — Progress Notes (Signed)
[place] PHYSICAL MEDICINE & REHABILITATION PROGRESS NOTE  Subjective/Complaints: Patient seen laying in bed this morning.  She is resting comfortably this morning.  No reported issues overnight.  ROS: Limited due to cognition  Objective: Vital Signs: Blood pressure 135/75, pulse 80, temperature 98.5 F (36.9 C), resp. rate 18, height 5\' 9"  (1.753 m), weight 59.3 kg, SpO2 100 %. No results found. No results for input(s): WBC, HGB, HCT, PLT in the last 72 hours. Recent Labs    04/28/20 0455  NA 140  K 4.4  CL 98  CO2 32  GLUCOSE 126*  BUN 23  CREATININE 0.59  CALCIUM 10.0   No intake or output data in the 24 hours ending 04/29/20 0819      Physical Exam: BP 135/75   Pulse 80   Temp 98.5 F (36.9 C)   Resp 18   Ht 5\' 9"  (1.753 m)   Wt 59.3 kg   SpO2 100%   BMI 19.31 kg/m  Constitutional: No distress . Vital signs reviewed. HENT: Normocephalic.  Atraumatic. Scalp with VP shunt Neck: #6 cuffless trach with trach collar Eyes: EOMI. No discharge. Cardiovascular: No JVD.  RRR. Respiratory: Normal effort.  No stridor.  Bilateral clear to auscultation. GI: Non-distended.  BS +.  + PEG. Skin: Warm and dry.  Intact. Psych: Appropriate, but confused. Musc: No edema in extremities.  No tenderness in extremities. Neuro: Alert Motor: 4+-5/5 throughout  Assessment/Plan: 1. Functional deficits which require 3+ hours per day of interdisciplinary therapy in a comprehensive inpatient rehab setting.  Physiatrist is providing close team supervision and 24 hour management of active medical problems listed below.  Physiatrist and rehab team continue to assess barriers to discharge/monitor patient progress toward functional and medical goals   Care Tool:  Bathing    Body parts bathed by patient: Right arm,Left arm,Chest,Abdomen,Right upper leg,Left upper leg,Right lower leg,Left lower leg,Face   Body parts bathed by helper: Front perineal area,Buttocks     Bathing  assist Assist Level: Moderate Assistance - Patient 50 - 74%     Upper Body Dressing/Undressing Upper body dressing   What is the patient wearing?: Pull over shirt    Upper body assist Assist Level: Maximal Assistance - Patient 25 - 49%    Lower Body Dressing/Undressing Lower body dressing      What is the patient wearing?: Incontinence brief,Pants     Lower body assist Assist for lower body dressing: Maximal Assistance - Patient 25 - 49%     Toileting Toileting    Toileting assist Assist for toileting: Total Assistance - Patient < 25%     Transfers Chair/bed transfer  Transfers assist     Chair/bed transfer assist level: Moderate Assistance - Patient 50 - 74%     Locomotion Ambulation   Ambulation assist      Assist level: 2 helpers (mod A of 1 and +2 line management) Assistive device: Walker-rolling Max distance: 112ft   Walk 10 feet activity   Assist  Walk 10 feet activity did not occur: Refused  Assist level: 2 helpers (mod A of 1 and +2 line management) Assistive device: Walker-rolling   Walk 50 feet activity   Assist Walk 50 feet with 2 turns activity did not occur: Refused  Assist level: 2 helpers (mod A of 1 and +2 line management) Assistive device: Walker-rolling    Walk 150 feet activity   Assist Walk 150 feet activity did not occur: Refused  Walk 10 feet on uneven surface  activity   Assist Walk 10 feet on uneven surfaces activity did not occur: Refused         Wheelchair     Assist Will patient use wheelchair at discharge?: Yes Type of Wheelchair: Manual Wheelchair activity did not occur: Refused         Wheelchair 50 feet with 2 turns activity    Assist            Wheelchair 150 feet activity     Assist           Medical Problem List and Plan: 1. 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  Antithrombotics: -DVT/anticoagulation: Subcutaneous heparin             -antiplatelet therapy: N/A 3. Pain Management: Tylenol as needed 4. Mood: Provide emotional support             -antipsychotic agents: Seroquel 25 mg twice daily   Noted to be agitated at times with therapies, will continue to monitor             -pt is fall risk 5. Neuropsych: This patient is not capable of making decisions on her own behalf.  Will consider Ritalin if patient continues to be distracted. 6. Skin/Wound Care: Routine skin checks 7. Fluids/Electrolytes/Nutrition: Routine in and outs  BMP within acceptable range on 1/31 8. Seizure prophylaxis.   Keppra decreased to 500 BID on 1/28, will continue wean tomorrow 9. 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 Constance Holster.   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 Earleen Newport.       Dietary and speech therapy follow-up  Advanced diet as tolerated 11. Hyperglycemia related to tube feeds. SSI   CBG (last 3)  Recent Labs    04/28/20 2349 04/29/20 0357 04/29/20 0801  GLUCAP 121* 92 163*   Slightly labile on 2/1 12. Hypothyroidism: Syndthroid 20. Hypertension. Hydralazine 50 mg every 8 hours, Normodyne 300 mg 3 times daily, Norvasc 10 mg daily   Vitals:   04/29/20 0353 04/29/20 0512  BP: 120/66 135/75  Pulse: 80   Resp: 18   Temp: 98.5 F (36.9 C)   SpO2: 100%    Controlled on 2/1  Monitor with increased mobility  LOS: 7 days A FACE TO FACE EVALUATION WAS PERFORMED  Oluwademilade Kellett Lorie Phenix 04/29/2020, 8:19 AM

## 2020-04-29 NOTE — Progress Notes (Signed)
Occupational Therapy Session Note  Patient Details  Name: Stacy Moran MRN: [id number] Date of Birth: 16-Apr-1955  Today's Date: 04/29/2020 OT Individual Time: 1410-1440 OT Individual Time Calculation (min): 30 min    Short Term Goals: Week 1:  OT Short Term Goal 1 (Week 1): Pt will sit EOB with no LOB during 1 ADL task OT Short Term Goal 2 (Week 1): Pt will complete bathroom transfer with min HHA OT Short Term Goal 3 (Week 1): Pt will complete LB dressing with min A OT Short Term Goal 4 (Week 1): Pt will require no more than min cueing for focused attention to ADL task  Skilled Therapeutic Interventions/Progress Updates:    Pt sitting up in w/c, alert and more responsive today.  Pt participated in various tabletop activities to facilitate increased attention and command following including picking up and placing different items using one step and two step verbal commands provided by OT.  Pt able to consistently follow one step commands, however required increased multimodal cueing to follow two step commands successfully.  Pt participated in ball toss/catch activity requiring intermittent VCs to stay on task as well as reduction of external distraction.  Good visual scanning to right, however reduced attention to left despite cues.  Call bell in reach, pelvic restraint belt secured, seat belt alarm on.    Therapy Documentation Precautions:  Precautions Precautions: Fall Precaution Comments: trach 28%/5L; pelvic restraint in bed, PMSV with staff, very low voice quality Required Braces or Orthoses: Other Brace Other Brace: abdominal binder - for PEG Restrictions Weight Bearing Restrictions: No   Therapy/Group: Individual Therapy  Ezekiel Slocumb 04/29/2020, 3:14 PM

## 2020-04-29 NOTE — Progress Notes (Signed)
Physical Therapy Session Note  Patient Details  Name: Stacy Moran MRN: [id number] Date of Birth: July 14, 1955  Today's Date: 04/29/2020 PT Individual Time: 1100-1157 PT Individual Time Calculation (min): 57 min   Short Term Goals: Week 1:  PT Short Term Goal 1 (Week 1): Patient will complete bed mobility with CGA consistently PT Short Term Goal 2 (Week 1): Patient will be able to transfer bed<> wc with LRAD and CGA consistently PT Short Term Goal 3 (Week 1): Patient will tolerate sitting up in wc for at least 30 mins outside of therapy with appropriate safety awareness noted PT Short Term Goal 4 (Week 1): Patient will initiate gait training with LRAD  Skilled Therapeutic Interventions/Progress Updates:    Pt received sitting upright in manual w/c, awake and agreeable to therapy. Safety belt alarm and pelvic restraint on. Coordinated with RN to pause PEG feedings for therapy session. Pt with no reports of pain. SpO2 98% with HR 80 while on 5L O2 via trach mask. Pt on 6L O2 portable tank throughout session. W/c transport for energy conservation and time management to day room gym.  Gait training 2x161f+ 41ft with minA and RW with +2 assist for chair follow for safety. Noted patient to be impulsive with abrupt sitting without cues and also reaching for various objects within her path. She has significantly poor safety awareness and often has a lot of difficulty with RW management and keeping close proximity. SpO2 reading 100% after gait trials. Placed 5 objects in the dayroom gym and instructed patient to locate and retreive them to promote visual scanning and safety awareness. She was able to locate 1/5 objects and would attempt to reach for the object before getting close to it, causing her to have x1 large LOB posteriorly requiring MAXA for recovery.   Pt was returned to her room with totalA in w/c and when provided option, pt requesting to remain seated in chair at end of session. Reconnected to  wall O2 and donned pelvic restraint and safety belt alarm. All needs within reach at end of session and RN notified of pt's mobility and pt's status for resumption of PEG feedings.  Therapy Documentation Precautions:  Precautions Precautions: Fall Precaution Comments: trach 28%/5L; pelvic restraint in bed, PMSV with staff, very low voice quality Required Braces or Orthoses: Other Brace Other Brace: abdominal binder - for PEG Restrictions Weight Bearing Restrictions: No  Therapy/Group: Individual Therapy  Azha Constantin P Ita Fritzsche PT,DPT 04/29/2020, 7:53 AM

## 2020-04-29 NOTE — Progress Notes (Signed)
Occupational Therapy Session Note  Patient Details  Name: Stacy Moran MRN: [id number] Date of Birth: 07-16-1955  Today's Date: 04/29/2020 OT Individual Time: 0930-1030 OT Individual Time Calculation (min): 60 min    Short Term Goals: Week 1:  OT Short Term Goal 1 (Week 1): Pt will sit EOB with no LOB during 1 ADL task OT Short Term Goal 2 (Week 1): Pt will complete bathroom transfer with min HHA OT Short Term Goal 3 (Week 1): Pt will complete LB dressing with min A OT Short Term Goal 4 (Week 1): Pt will require no more than min cueing for focused attention to ADL task  Skilled Therapeutic Interventions/Progress Updates:    Pt was easier to wake this am.  She sat to EOB with mod A.  +2 A today as pt was connected to O2, feeding tube and continues to be impulsive.  Used communication board to ask pt if she needed to toilet in which she responded no. Sitting EOB, had pt bathe and dress with mod A overall and min to rise to stand and min to hold static balance.  Pt sat down on bed, and urinated suddenly then pointed to the bathroom. Pt had soaked through her brief.  Using RW and mod to MAX A (for cuing, guiding), pt ambulated to toilet, total a with clothing and mod cues for pt to wash self.  Returned to wc with RW with max cues and mod A as pt moving impulsively.  Set up in wc with belt alarm, posey restraint.  All needs met and call light in her lap.   Therapy Documentation Precautions:  Precautions Precautions: Fall Precaution Comments: trach 28%/5L; pelvic restraint in bed, PMSV with staff, very low voice quality Required Braces or Orthoses: Other Brace Other Brace: abdominal binder - for PEG Restrictions Weight Bearing Restrictions: No       Pain: Pain Assessment Pain Scale: 0-10 Pain Score: 0-No pain ADL: ADL Eating: NPO Grooming: Minimal assistance Where Assessed-Grooming: Edge of bed Upper Body Bathing: Minimal assistance,Moderate cueing Where Assessed-Upper Body  Bathing: Edge of bed Lower Body Bathing: Minimal assistance,Moderate cueing Where Assessed-Lower Body Bathing: Edge of bed Upper Body Dressing: Moderate cueing,Minimal assistance Where Assessed-Upper Body Dressing: Edge of bed Lower Body Dressing: Moderate cueing,Moderate assistance Where Assessed-Lower Body Dressing: Edge of bed Toileting: Moderate assistance,Moderate cueing Where Assessed-Toileting: Glass blower/designer: Moderate assistance,Moderate verbal cueing Toilet Transfer Method: Counselling psychologist: Grab bars,Bedside commode      Therapy/Group: Individual Therapy  [place] 04/29/2020, 12:20 PM

## 2020-04-30 LAB — GLUCOSE, CAPILLARY
Glucose-Capillary: 114 mg/dL — ABNORMAL HIGH (ref 70–99)
Glucose-Capillary: 116 mg/dL — ABNORMAL HIGH (ref 70–99)
Glucose-Capillary: 127 mg/dL — ABNORMAL HIGH (ref 70–99)
Glucose-Capillary: 136 mg/dL — ABNORMAL HIGH (ref 70–99)
Glucose-Capillary: 150 mg/dL — ABNORMAL HIGH (ref 70–99)
Glucose-Capillary: 95 mg/dL (ref 70–99)

## 2020-04-30 MED ORDER — METHYLPHENIDATE HCL 5 MG PO TABS
2.5000 mg | ORAL_TABLET | Freq: Two times a day (BID) | ORAL | Status: DC
  Administered 2020-04-30 – 2020-05-07 (×14): 2.5 mg via ORAL
  Filled 2020-04-30 (×14): qty 1

## 2020-04-30 MED ORDER — LEVETIRACETAM 100 MG/ML PO SOLN
250.0000 mg | Freq: Two times a day (BID) | ORAL | Status: DC
  Administered 2020-04-30 – 2020-05-06 (×13): 250 mg
  Filled 2020-04-30 (×13): qty 5

## 2020-04-30 MED ORDER — LEVETIRACETAM 100 MG/ML PO SOLN: 250.0000 mg | Freq: Two times a day (BID) | ORAL | Status: DC

## 2020-04-30 NOTE — Progress Notes (Addendum)
PHYSICAL MEDICINE & REHABILITATION PROGRESS NOTE  Subjective/Complaints: Patient seen laying in bed this morning.  No reported issues overnight.  She is sleepy this morning.  ROS: Limited due to cognition.  Objective: Vital Signs: Blood pressure 125/90, pulse 80, temperature 98.6 F (37 C), temperature source Oral, resp. rate 18, height 5\' 9"  (1.753 m), weight 59.3 kg, SpO2 97 %. No results found. No results for input(s): WBC, HGB, HCT, PLT in the last 72 hours. Recent Labs    04/28/20 0455  NA 140  K 4.4  CL 98  CO2 32  GLUCOSE 126*  BUN 23  CREATININE 0.59  CALCIUM 10.0   No intake or output data in the 24 hours ending 04/30/20 0906      Physical Exam: BP 125/90 (BP Location: Left Arm)   Pulse 80   Temp 98.6 F (37 C) (Oral)   Resp 18   Ht 5\' 9"  (1.753 m)   Wt 59.3 kg   SpO2 97%   BMI 19.31 kg/m  Constitutional: No distress . Vital signs reviewed. HENT:  Scalp with decreased Neck: #6 cuffless trach with trach collar Eyes: EOMI. No discharge. Cardiovascular: No JVD.  RRR. Respiratory: Normal effort.  No stridor.  Bilateral clear to auscultation. GI: Non-distended.  BS +.  + PEG. Skin: Warm and dry.  Intact. Psych: Flat.  Confused. Musc: No edema in extremities.  No tenderness in extremities. Neuro: Somnolent Motor: 4+-5/5 throughout, unchanged  Assessment/Plan: 1. Functional deficits which require 3+ hours per day of interdisciplinary therapy in a comprehensive inpatient rehab setting.  Physiatrist is providing close team supervision and 24 hour management of active medical problems listed below.  Physiatrist and rehab team continue to assess barriers to discharge/monitor patient progress toward functional and medical goals   Care Tool:  Bathing    Body parts bathed by patient: Right arm,Left arm,Chest,Abdomen,Right upper leg,Left upper leg,Right lower leg,Left lower leg,Face,Front perineal area,Buttocks   Body parts bathed by helper:  Front perineal area,Buttocks     Bathing assist Assist Level: Minimal Assistance - Patient > 75% (mod cues)     Upper Body Dressing/Undressing Upper body dressing   What is the patient wearing?: Pull over shirt    Upper body assist Assist Level: Moderate Assistance - Patient 50 - 74%    Lower Body Dressing/Undressing Lower body dressing      What is the patient wearing?: Incontinence brief,Pants     Lower body assist Assist for lower body dressing: Moderate Assistance - Patient 50 - 74%     Toileting Toileting    Toileting assist Assist for toileting: Minimal Assistance - Patient > 75% (mod cues)     Transfers Chair/bed transfer  Transfers assist     Chair/bed transfer assist level: Moderate Assistance - Patient 50 - 74%     Locomotion Ambulation   Ambulation assist      Assist level: 2 helpers (mod A of 1 and +2 line management) Assistive device: Walker-rolling Max distance: 14ft   Walk 10 feet activity   Assist  Walk 10 feet activity did not occur: Refused  Assist level: 2 helpers (mod A of 1 and +2 line management) Assistive device: Walker-rolling   Walk 50 feet activity   Assist Walk 50 feet with 2 turns activity did not occur: Refused  Assist level: 2 helpers (mod A of 1 and +2 line management) Assistive device: Walker-rolling    Walk 150 feet activity   Assist Walk 150 feet activity did not  occur: Refused         Walk 10 feet on uneven surface  activity   Assist Walk 10 feet on uneven surfaces activity did not occur: Refused         Wheelchair     Assist Will patient use wheelchair at discharge?: Yes Type of Wheelchair: Manual Wheelchair activity did not occur: Refused         Wheelchair 50 feet with 2 turns activity    Assist            Wheelchair 150 feet activity     Assist           Medical Problem List and Plan: 1. 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  Continue CIR  Team conference today to discuss current and goals and coordination of care, home and environmental barriers, and discharge planning with nursing, case manager, and therapies. Please see conference note from today as well.  2.  Antithrombotics: -DVT/anticoagulation: Subcutaneous heparin             -antiplatelet therapy: N/A 3. Pain Management: Tylenol as needed 4. Mood: Provide emotional support             -antipsychotic agents: Seroquel 25 mg twice daily   Noted to be agitated at times with therapies, will continue to monitor             -pt is fall risk 5. Neuropsych: This patient is not capable of making decisions on her own behalf.  Will consider Ritalin if patient continues to be distracted. 6. Skin/Wound Care: Routine skin checks 7. Fluids/Electrolytes/Nutrition: Routine in and outs  BMP within acceptable range on 1/31 8. Seizure prophylaxis.   Keppra decreased to 500 BID on 1/28, decreased further on 2/2 9. 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 Constance Holster.   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 Earleen Newport.       Dietary and speech therapy follow-up  Advanced diet as tolerated 11. Hyperglycemia related to tube feeds. SSI   CBG (last 3)  Recent Labs    04/30/20 0007 04/30/20 0357 04/30/20 0842  GLUCAP 95 116* 150*   Slightly elevated on 2/2 12. Hypothyroidism: Syndthroid 79. Hypertension. Hydralazine 50 mg every 8 hours, Normodyne 300 mg 3 times daily, Norvasc 10 mg daily   Vitals:   04/30/20 0435 04/30/20  0749  BP:    Pulse: 85 80  Resp: 16 18  Temp:    SpO2: 98% 97%   Controlled on 2/2  Monitor with increased mobility  LOS: 8 days A FACE TO FACE EVALUATION WAS PERFORMED  Stacy Moran Stacy Moran 04/30/2020, 9:06 AM

## 2020-04-30 NOTE — Plan of Care (Signed)
  Problem: RH Balance Goal: LTG Patient will maintain dynamic standing balance (PT) Description: LTG:  Patient will maintain dynamic standing balance with assistance during mobility activities (PT) Flowsheets (Taken 04/30/2020 0735) LTG: Pt will maintain dynamic standing balance during mobility activities with:: Contact Guard/Touching assist Note: Downgraded due to poor safety awareness and impulsivity   Problem: Sit to Stand Goal: LTG:  Patient will perform sit to stand with assistance level (PT) Description: LTG:  Patient will perform sit to stand with assistance level (PT) Flowsheets (Taken 04/30/2020 0736) LTG: PT will perform sit to stand in preparation for functional mobility with assistance level: Contact Guard/Touching assist Note: Downgraded due to poor safety awareness and impulsivity   Problem: RH Bed to Chair Transfers Goal: LTG Patient will perform bed/chair transfers w/assist (PT) Description: LTG: Patient will perform bed to chair transfers with assistance (PT). Flowsheets (Taken 04/30/2020 0736) LTG: Pt will perform Bed to Chair Transfers with assistance level: Contact Guard/Touching assist Note: Downgraded due to poor safety awareness and impulsivity   Problem: RH Car Transfers Goal: LTG Patient will perform car transfers with assist (PT) Description: LTG: Patient will perform car transfers with assistance (PT). Flowsheets (Taken 04/30/2020 0736) LTG: Pt will perform car transfers with assist:: Minimal Assistance - Patient > 75% Note: Downgraded due to poor safety awareness and impulsivity

## 2020-04-30 NOTE — Progress Notes (Signed)
Physical Therapy Weekly Progress Note  Patient Details  Name: Stacy Moran MRN: [id number] Date of Birth: Dec 29, 1955  Beginning of progress report period: April 23, 2020 End of progress report period: April 30, 2020  Today's Date: 04/30/2020 PT Individual Time: 1430-1530 PT Individual Time Calculation (min): 60 min   Patient has met 2 of 4 short term goals. Pt is making slow progress towards goals. Her performance fluctuates depending on level of alertness and fatigue. She remains of PEG feedings and 5-6L O2 via trach mask and mostly nonverbal although can respond appropriately ~75% of the time to yes/no questions. Consistently, she has required min/modA for bed mobility and transfers, primarily for initiation and safety. She has been able to ambulate distances up to ~147f with min/modA and RW with a +2 assist for chair follow. Pt can be impulsive at times with abrupt sitting during gait or reaching for objects/lines/leads during functional gait or mobility training. She has been able to tolerate sitting in w/c outside of therapies but does need the safety belt alarm and pelvic restraint due to poor cognitive awareness and safety awareness.  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  Patient progressing toward long term goals..  Plan of care revisions:  Downgraded all goals from supervision to CGA/minA due to poor safety awareness and impulsivity.  PT Short Term Goals Week 2:  PT Short Term Goal 1 (Week 2): Pt will consistently complete bed mobility with CGA PT Short Term Goal 2 (Week 2): Pt will consistently perform bed<>chair transfers with minA and LRAD PT Short Term Goal 3 (Week 2): Pt will ambulate 1560fwith minA and LRAD PT Short Term Goal 4 (Week 2): Pt will initiate stair training PT Short Term Goal 5 (Week 2): Pt will demonstrate improved safety awareness and decreased impuslvity with functional mobility tasks  Skilled Therapeutic Interventions/Progress Updates:    Pt received sleeping in bed, awakens to voice and agreeable to therapy. Shakes head no when asked about pain. Coordinated with RN to pause PEG feedings. Removed pelvic restraint. Required minA for supine<>sit, primarily for initiation. Able to sit EOB with SBA. SPO2 reading 98% while on 5L wall oxygen. Pt noted to have copious, thick secretions from trach site and provided external suctioning for relief. Therapist had to due this x3 more times during our session due to secretions. Connected to portable tank at 6L O2 and performed stand<>pivot with minA. W/c transport to main therapy gym for time management.   Focused remainder of session on gait training and tolerance for upright. She ambulated [id number] with bilateral hand held assist and +2 for w/c follow for safety. SpO2 reading 98% after each gait trial but noted increased secretions as well. Pt continues to be highly distractable and will lose her balance very quickly with these distractions. She had at least 3-4 LOB requiring modA for correction. x1 instance where a person waved to her and she let go of therapists hand to wave back, causing  a large LOB. Gait deficits include forward flexed trunk, increased knee extension in  stance, decreased gait speed.   Pt ended session seated in w/c with pelvic restraint donned, safety belt alarm on, reconnected to wall O2 at 5L. RN notified at end of session of pt's status. All needs within reach  Therapy Documentation Precautions:  Precautions Precautions: Fall Precaution Comments: trach 28%/5L; pelvic restraint in bed, PMSV with staff, very low voice quality Required Braces or Orthoses: Other Brace Other Brace: abdominal binder - for PEG Restrictions Weight Bearing Restrictions: No  Therapy/Group: Individual Therapy  Armonii Sieh P Ahmiya Abee  PT 04/30/2020, 7:30 AM

## 2020-04-30 NOTE — Progress Notes (Signed)
Occupational Therapy Weekly Progress Note  Patient Details  Name: Stacy Moran MRN: [id number] Date of Birth: 1956/01/27  Beginning of progress report period: April 23, 2020 End of progress report period: April 30, 2020  Today's Date: 04/30/2020 OT Individual Time: 9678-9381 OT Individual Time Calculation (min): 54 min    Patient has met 1 of 4 short term goals and is progressing slowly towards goals.  Pt's barriers towards progress are fluctuating alertness and resulting cognition, presence of trach on 4-5 L O2 at 28% humidified air with consistent, presence of PEG tube, as well as deficits listed below.  Pt currently requires mod to max assist +2  overall for basic self care and functional mobility performance with significant distractibility, impulsiveness, and ideational apraxia. Pt also exhibits urinary and bowel incontinence with therapy.   Pt requires further skilled OT to promote maximized independence and safety and reduce caregiver burden throughout ADLs.    Patient continues to demonstrate the following deficits: muscle weakness, decreased cardiorespiratoy endurance, impaired timing and sequencing, motor apraxia, decreased coordination and decreased motor planning, decreased visual perceptual skills, decreased attention to left, decreased initiation, decreased attention, decreased awareness, decreased problem solving, decreased safety awareness, decreased memory and delayed processing and decreased standing balance and decreased balance strategies and therefore will continue to benefit from skilled OT intervention to enhance overall performance with BADL.  Patient progressing toward long term goals..  Continue plan of care.  OT Short Term Goals Week 1:  OT Short Term Goal 1 (Week 1): Pt will sit EOB with no LOB during 1 ADL task OT Short Term Goal 1 - Progress (Week 1): Met OT Short Term Goal 2 (Week 1): Pt will complete bathroom transfer with min HHA OT Short Term Goal 2 -  Progress (Week 1): Progressing toward goal OT Short Term Goal 3 (Week 1): Pt will complete LB dressing with min A OT Short Term Goal 3 - Progress (Week 1): Progressing toward goal OT Short Term Goal 4 (Week 1): Pt will require no more than min cueing for focused attention to ADL task OT Short Term Goal 4 - Progress (Week 1): Progressing toward goal Week 2:  OT Short Term Goal 1 (Week 2): Pt will complete bathroom transfer with min HHA OT Short Term Goal 2 (Week 2): Pt will complete LB dressing with min A OT Short Term Goal 3 (Week 2): Pt will require no more than min cueing for focused attention to ADL task OT Short Term Goal 4 (Week 2): Pt will complete sinkside bathing with min assist and mod cueing.  Skilled Therapeutic Interventions/Progress Updates:    Pt semi upright in bed, no signs of pain, appeared to have taken button down shirt off. Pt nonverbal but receptive to participate with max TCs. Pt on 4L O2 via trach collar at 28% humidified air with SPO2 96-97% throughout session.  Less mucous secretions throughout session as well today. Pt required max assist supine to sit, noting pt with heavy urinary soiling of brief.  Max assist to doff brief and mod assist stand pivot to w/c and +2 for line mgt due to pt distractible and attempting to pull at lines. Pt setup at sink with washcloth and removal of all extraneous ADL items to reduce distraction.  Pt required max assist today with max hand over hand to facilitate participation during UB bathing.  Total assist for LB bathing with mod assist +2 for sit<>stand due to pt with difficulty following simple commands with multimodal cues.  Pt  donned shirt with mod assist and max assist to donn pants.  Pt closed eyes thereafter with no participation despite OT providing max multimodal cueing and encouragement.  Pt nodding head yes when asked if she wanted to return to bed due to fatigue.  Pt required mod assist and +2 for line mgt stand pivot w/c to EOB.  Sit  to supine with min assist.  Pelvic restraint donned, HOB elevated, call bell in reach, bed alarm on at end of session.  Therapy Documentation Precautions:  Precautions Precautions: Fall Precaution Comments: trach 28%/5L; pelvic restraint in bed, PMSV with staff, very low voice quality Required Braces or Orthoses: Other Brace Other Brace: abdominal binder - for PEG Restrictions Weight Bearing Restrictions: No   Therapy/Group: Individual Therapy  Ezekiel Slocumb 04/30/2020, 9:50 AM

## 2020-04-30 NOTE — Patient Care Conference (Signed)
Inpatient RehabilitationTeam Conference and Plan of Care Update Date: 04/30/2020   Time: 11:06 AM    Patient Name: Stacy Moran      Medical Record Number: [id number]  Date of Birth: 02/18/1956 Sex: Female         Room/Bed: 4M01C/4M01C-01 Payor Info: Payor: MEDICAID POTENTIAL / Plan: MEDICAID POTENTIAL / Product Type: *No Product type* /    Admit Date/Time:  04/22/2020  2:29 PM  Primary Diagnosis:  ICH (intracerebral hemorrhage) [facility])  Hospital Problems: Principal Problem:   ICH (intracerebral hemorrhage) ([place]) Active Problems:   Benign essential HTN   Dysphagia   Status post tracheostomy ([place])   S/P percutaneous endoscopic gastrostomy (PEG) tube placement ([place])   Protein-calorie malnutrition, severe   Dysphagia, post-stroke   Essential hypertension   Seizure prophylaxis    Expected Discharge Date: Expected Discharge Date: 05/21/20  Team Members Present: Physician leading conference: Dr. Delice Lesch Care Coodinator Present: Dorien Chihuahua, RN, BSN, CRRN Nurse Present: Dorthula Nettles, RN PT Present: Ginnie Smart, PT OT Present: Leretha Pol, OT SLP Present: Nadara Mode, SLP PPS Coordinator present : Gunnar Fusi, Novella Olive, PT     Current Status/Progress Goal Weekly Team Focus  Bowel/Bladder   Mostly incontent of urine and mostly continent of stool.  Keep patient clean and dry.  Encourage toileting.   Swallow/Nutrition/ Hydration   NPO, trials of ice chips with ST only. Repeat MBS when secretions improve  Min A  trials of ice chips with ST only   ADL's   mod A overall  supervision to CGA overall  ADL training, balance, cognition, endurance, coordination   Mobility   Fluctuates depending on alertness and ability to participate. Consistently requires min/modA for most mobility including bed mobility, transfers, and gait. Gait up to ~65-166ft with RW and +2 assist for safety. Copious thick secretions from trach site.  CGA/supervision - downgraded all goals  to CGA due to decreased safety awareness and impulsivity  Functional task initiation, upright tolerance, gait training as tolerated.   Communication   Aphonic, mouthing at 1-2 word level  Min-Mod A  writting at 1-2 word level, naming objects/function, phonating at phonemic level, following 1 step commands   Safety/Cognition/ Behavioral Observations  Max-Mod A, increasing sustained attention and problem solving, but impacted by fatigue/behavior  Min A  basic problem solving, sustained attention and error awareness   Pain   No complaints of pain  Pain equal to or less than 3.  Assess pain q shift.   Skin   No current breakdown.  No break down  Assess skin q shift.     Discharge Planning:  goal hopefully is home with daughter and her family but they work, so wil need to hire assist or have another family member with her. Pt is uninsured   Team Discussion: Seizure medication weaning per MD. Copious amount of secretions from [place]; not tolerating PMSV.Recmins NPO with TF per PEG. Progress limited by secretions, distractibility, poor safety awareness, impulsivity, decreased alertness and decreased attention. Requires tactile cues for initiation of activitiy. Patient on target to meet rehab goals: no, currently mod - max assist for self care, min-mod assist for transfers. Able to ambulate with a rolling walker and +2 max assist however easily distracted and unsafe.  *See Care Plan and progress notes for long and short-term goals.   Revisions to Treatment Plan:  Downgraded goals to [facility] Working on voicing and one/two work Estate agent for Systems [employer] Needs: Trach care, nutritional means, medication administration, safety, transfers, toileting,  etc.  Current Barriers to Discharge: Decreased caregiver support, Home enviroment access/layout, Trach, Incontinence, Insurance for SNF coverage, Nutritional means and New oxygen  Possible Resolutions to Barriers: Medicaid application and medicare  application pending Family education; patient requested sister to come to assist daughter with care    Medical Summary Current Status: Decreased functional ability with altered mental status/obtunded secondary to left SAH/right frontal ventriculostomy 01/30/2020 with right SDH followed by laparoscopic assisted insertion of ventriculoperitoneal shunt as well 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 and bur hole evacuation of chronic subdural hematoma 03/18/2020  Barriers to Discharge: Behavior;Medical stability;New oxygen;Decreased family/caregiver support;Trach;Incontinence   Possible Resolutions to [employer] Focus: Therapies, monitor Trach/PEG, weaning seizure meds   Continued Need for Acute Rehabilitation Level of Care: The patient requires daily medical management by a physician with specialized training in physical medicine and rehabilitation for the following reasons: Direction of a multidisciplinary physical rehabilitation program to maximize functional independence : Yes Medical management of patient stability for increased activity during participation in an intensive rehabilitation regime.: Yes Analysis of laboratory values and/or radiology reports with any subsequent need for medication adjustment and/or medical intervention. : Yes   I attest that I was present, lead the team conference, and concur with the assessment and plan of the team.   Dorien Chihuahua B 04/30/2020, 1:12 PM

## 2020-04-30 NOTE — Progress Notes (Signed)
Speech Language Pathology Daily Session Note  Patient Details  Name: Stacy Moran MRN: [id number] Date of Birth: 1956/02/04  Today's Date: 04/30/2020 SLP Individual Time: 0900-1000 SLP Individual Time Calculation (min): 60 min  Short Term Goals: Week 2: SLP Short Term Goal 1 (Week 2): Pt will tolerate PMSV for 30 minutes without s/s of distress over x3 opportunites. SLP Short Term Goal 2 (Week 2): Pt will demonstrate sustained attention in 25 minute intervals with mod A multimodal cues in 3 sessions. SLP Short Term Goal 3 (Week 2): Pt will follow 1 step commands with mod A multimodal cues in 80% of opportunties. SLP Short Term Goal 4 (Week 2): Pt will consume trials of ice chips with moderate s/s aspiration and reduced expeling of mucus secretions, prior to repeat MBS. SLP Short Term Goal 5 (Week 2): Pt will communicate wants/needs at written word level with mod A verbal cues in 70% of opportunties. SLP Short Term Goal 6 (Week 2): Pt will phonate at the phenome level x2 with max A multimodal cues.  Skilled Therapeutic Interventions:   Patient seen to address speech-language and cognitive goals. She was initially difficult to arouse but then was able to maintain alertness for rest of session. RN performed deep suction as patient continues with persistent tracheal secretions. SLP donned PMV but patient only tolerated for a few minutes before she took it off. SLP presented dry erase board and marker with task of patient writing words to name object and action/verb photos. Initially, patient started doodling a picture but after significant cues, finally responded in writing, "I can't think". Patient then did not attempt to respond to other questions/stimuli. She completed oral care with toothbrush after SLP setup and supervision. Patient continues to benefit from skilled SLP intervention to maximize cognitive-linguistic, speech and swallow function prior to discharge.  Pain Pain Assessment Pain  Scale: Faces Faces Pain Scale: No hurt  Therapy/Group: Individual Therapy   Sonia Baller, MA, CCC-SLP Speech Therapy

## 2020-04-30 NOTE — Progress Notes (Signed)
Patient ID: Stacy Moran, female   DOB: 27-Feb-1956, 65 y.o.   MRN: [id number] Team Conference Report to Patient/Family  Team Conference discussion was reviewed with the patient and caregiver, including goals, any changes in plan of care and target discharge date.  Patient and caregiver express understanding and are in agreement.  The patient has a target discharge date of 05/21/20.  [name] 04/30/2020, 1:57 PM

## 2020-04-30 NOTE — Progress Notes (Signed)
Nutrition Follow-up  RD working remotely.  DOCUMENTATION CODES:   Severe malnutrition in context of acute illness/injury  INTERVENTION:   - Please obtain updated weight, last available weight is from 04/22/20  Continue tube feeds via PEG: - Osmolite 1.2 @ 75 ml/hr (tube feeding can be held for up to 4 hours for therapies) - Continue ProSource TF 45 ml BID - Continue fee water flushes of 200 ml q 6 hours  Tube feeding regimen provides 1880 kcal, 105 grams of protein, and 1230 ml of H2O.  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  Ongoing  GOAL:   Patient will meet greater than or equal to 90% of their needs  Met via TF  MONITOR:   Diet advancement,Labs,Weight trends,TF tolerance,I & O's  REASON FOR ASSESSMENT:   Consult Enteral/tube feeding initiation and management  ASSESSMENT:   65 year old female with unremarkable PMH. Pt presented on 01/30/20 with headache and obtundation requiring intubation for airway protection. 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 Pt underwent right frontal ventriculostomy on 02/26/20. Pt then underwent diagnostic cerebral angiogram for further delineating the left PICA aneurysm which was noted not to be amenable to endovascular treatment so pt underwent suboccipital craniotomy for clipping of PICA aneurysm 01/31/20. Pt required  tracheostomy as well as thyroidectomy on 02/08/20. Pt remains NPO with PEG in place. Admitted to CIR on 04/22/20.  Pt remains NPO and continues to work with SLP towards diet advancement.  Pt tolerating current tube feeding regimen without reported issue. Will continue with current regimen.  Current TF: Osmolite 1.2 @ 75  ml/hr x 20 hours, ProSource TF 45 ml BID, free water flushes 200 ml q 6 hours  Medications reviewed and include: SSI q 4 hours, protonix, scopolamine patch  Labs reviewed. CBG's: 95-150 x 24 hours  Diet Order:   Diet Order            Diet NPO time specified  Diet effective now                 EDUCATION NEEDS:   No education needs have been identified at this time  Skin:  Skin Assessment: Skin Integrity Issues: Incisions: abdomen  Last BM:  04/27/20 medium type 5  Height:   Ht Readings from Last 1 Encounters:  04/22/20 5' 9"  (1.753 m)    Weight:   Wt Readings from Last 1 Encounters:  04/22/20 59.3 kg    BMI:  Body mass index is 19.31 kg/m.  Estimated Nutritional Needs:   Kcal:  6945-0388  Protein:  90-105 grams  Fluid:  1.7-1.9 L    Gustavus Bryant, MS, RD, LDN Inpatient Clinical Dietitian Please see [facility] for contact information.

## 2020-05-01 LAB — GLUCOSE, CAPILLARY
Glucose-Capillary: 100 mg/dL — ABNORMAL HIGH (ref 70–99)
Glucose-Capillary: 110 mg/dL — ABNORMAL HIGH (ref 70–99)
Glucose-Capillary: 120 mg/dL — ABNORMAL HIGH (ref 70–99)
Glucose-Capillary: 160 mg/dL — ABNORMAL HIGH (ref 70–99)
Glucose-Capillary: 86 mg/dL (ref 70–99)
Glucose-Capillary: 89 mg/dL (ref 70–99)
Glucose-Capillary: 93 mg/dL (ref 70–99)

## 2020-05-01 NOTE — Progress Notes (Signed)
Pt asleep when RT came by for scheduled ATC check. No obvious distress noted. RT will continue to monitor.

## 2020-05-01 NOTE — Progress Notes (Signed)
Speech Language Pathology Daily Session Note  Patient Details  Name: Stacy Moran MRN: [id number] Date of Birth: 1955-10-22  Today's Date: 05/01/2020 SLP Individual Time: 2458-0998 SLP Individual Time Calculation (min): 45 min  Short Term Goals: Week 2: SLP Short Term Goal 1 (Week 2): Pt will tolerate PMSV for 30 minutes without s/s of distress over x3 opportunites. SLP Short Term Goal 2 (Week 2): Pt will demonstrate sustained attention in 25 minute intervals with mod A multimodal cues in 3 sessions. SLP Short Term Goal 3 (Week 2): Pt will follow 1 step commands with mod A multimodal cues in 80% of opportunties. SLP Short Term Goal 4 (Week 2): Pt will consume trials of ice chips with moderate s/s aspiration and reduced expeling of mucus secretions, prior to repeat MBS. SLP Short Term Goal 5 (Week 2): Pt will communicate wants/needs at written word level with mod A verbal cues in 70% of opportunties. SLP Short Term Goal 6 (Week 2): Pt will phonate at the phenome level x2 with max A multimodal cues.  Skilled Therapeutic Interventions:   Patient seen for skilled ST session focusing on speech-language and cognitive goals. MD entered room and patient was able to write "when can  go hom(e)?". She was able to write partial words to name 'sock' (scck) and "toothps" (toothbrush). She then started getting distracted by pulsox sensor and wire that was attached to her finger, so SLP reattached to left hand. Patient continued to be distracted by objects on table and was not able to be redirected to task. SLP donned PMV for total of 15 minutes, reattaching when patient coughed it off. She did not attempt to vocalize but tolerated well overall. Patient continues to benefit from skilled SLP intervention to maximize cognitive-linguistic, speech and swallow function prior to discharge.  Pain Pain Assessment Pain Scale: 0-10 Faces Pain Scale: No hurt  Therapy/Group: Individual Therapy   Sonia Baller, MA,  CCC-SLP Speech Therapy

## 2020-05-01 NOTE — Progress Notes (Signed)
Occupational Therapy Session Note  Patient Details  Name: Stacy Moran MRN: [id number] Date of Birth: 06-09-1955  Today's Date: 05/01/2020 OT Individual Time: 1100-1200 OT Individual Time Calculation (min): 60 min    Short Term Goals: Week 2:  OT Short Term Goal 1 (Week 2): Pt will complete bathroom transfer with min HHA OT Short Term Goal 2 (Week 2): Pt will complete LB dressing with min A OT Short Term Goal 3 (Week 2): Pt will require no more than min cueing for focused attention to ADL task OT Short Term Goal 4 (Week 2): Pt will complete sinkside bathing with min assist and mod cueing.  Skilled Therapeutic Interventions/Progress Updates:    Pt semi upright in bed, shaking head "no" when asked any pain, agreeable with cueing to participate during OT session.  Pt on 4-5L O2 at 28% humidified air via trach collar throughout session with SPO2 98-100%.  Pt completed supine to sit with HOB slightly elevated with supervision. Pt donned bilateral socks with setup and supervision.  Pt required min assist including balance and mgt of trach tube/oxygen tank during stand pivot EOB to w/c.  Pt required min assist and max multimodal cueing to doff shirt overhead; total assist to doff abdominal binder.  Pt bathed UB with mod assist needing hand over hand to wash under arms.  Pt did actively wash BUE after OT initiated for pt.  Pt also bathed peri area with min assist for sit<>stand and standing balance and frequent multimodal cueing to remain standing for entire task due to pt impulsively attempting to sit mid task.  Pt required total assist to bathe buttocks.  Pt donned shirt with mod assist.  Total assist to donn abdominal binder and brief and max assist to donn pants at sit/stand level.  Pt also intermittently attempting to take washcloth and wipe down counters throughout self care needing redirection with max multmodal cueing. Pt demonstrated improved overall following of one step commands and easier to  redirect when distracted today to perform basic self care tasks.  Pt sitting up in w/c, pelvic restraint donned, call bell in reach, seat belt alarm on at end of session.   Therapy Documentation Precautions:  Precautions Precautions: Fall Precaution Comments: trach 28%/5L; pelvic restraint in bed, PMSV with staff, very low voice quality Required Braces or Orthoses: Other Brace Other Brace: abdominal binder - for PEG Restrictions Weight Bearing Restrictions: No   Therapy/Group: Individual Therapy  Ezekiel Slocumb 05/01/2020, 5:07 PM

## 2020-05-01 NOTE — Progress Notes (Signed)
[facility] PHYSICAL MEDICINE & REHABILITATION PROGRESS NOTE  Subjective/Complaints: Patient seen sitting up in bed working with therapy this morning.  She states she slept well overnight.  No reported issues overnight. She is more calm this a.m.  Discussed cooperation improved attention with therapies.  She attempts right down when she can go home.  ROS: Limited due to cognition.  Objective: Vital Signs: Blood pressure 127/73, pulse 80, temperature 98.7 F (37.1 C), resp. rate 19, height 5\' 9"  (1.753 m), weight 59.3 kg, SpO2 100 %. No results found. No results for input(s): WBC, HGB, HCT, PLT in the last 72 hours. No results for input(s): NA, K, CL, CO2, GLUCOSE, BUN, CREATININE, CALCIUM in the last 72 hours.  Intake/Output Summary (Last 24 hours) at 05/01/2020 0929 Last data filed at 04/30/2020 2136 Gross per 24 hour  Intake --  Output 400 ml  Net -400 ml        Physical Exam: BP 127/73 (BP Location: Right Arm)   Pulse 80   Temp 98.7 F (37.1 C)   Resp 19   Ht 5\' 9"  (1.753 m)   Wt 59.3 kg   SpO2 100%   BMI 19.31 kg/m  Constitutional: No distress . Vital signs reviewed. HENT:  Scalp with VPS Neck: +6 cuffless trach with PMV and trach collar Eyes: EOMI. No discharge. Cardiovascular: No JVD.  RRR. Respiratory: Normal effort.  No stridor.  Bilateral clear to auscultation. GI: Non-distended.  BS +.  + PEG. Skin: Warm and dry.  Intact. Psych: Flat.  More cooperative. Musc: No edema in extremities.  No tenderness in extremities. Neuro: Alert Motor: 4+-5/5 throughout, stable  Assessment/Plan: 1. Functional deficits which require 3+ hours per day of interdisciplinary therapy in a comprehensive inpatient rehab setting.  Physiatrist is providing close team supervision and 24 hour management of active medical problems listed below.  Physiatrist and rehab team continue to assess barriers to discharge/monitor patient progress toward functional and medical goals   Care  Tool:  Bathing    Body parts bathed by patient: Right arm,Left arm,Chest,Abdomen,Right upper leg,Left upper leg,Right lower leg,Left lower leg,Face,Front perineal area,Buttocks   Body parts bathed by helper: Front perineal area,Buttocks     Bathing assist Assist Level: 2 Helpers     Upper Body Dressing/Undressing Upper body dressing   What is the patient wearing?: Pull over shirt    Upper body assist Assist Level: Moderate Assistance - Patient 50 - 74%    Lower Body Dressing/Undressing Lower body dressing      What is the patient wearing?: Incontinence brief,Pants     Lower body assist Assist for lower body dressing: Total Assistance - Patient < 25%     Toileting Toileting    Toileting assist Assist for toileting: Minimal Assistance - Patient > 75% (mod cues)     Transfers Chair/bed transfer  Transfers assist     Chair/bed transfer assist level: Moderate Assistance - Patient 50 - 74%     Locomotion Ambulation   Ambulation assist      Assist level: 2 helpers (mod A of 1 and +2 line management) Assistive device: Walker-rolling Max distance: 134ft   Walk 10 feet activity   Assist  Walk 10 feet activity did not occur: Refused  Assist level: 2 helpers (mod A of 1 and +2 line management) Assistive device: Walker-rolling   Walk 50 feet activity   Assist Walk 50 feet with 2 turns activity did not occur: Refused  Assist level: 2 helpers (mod A of  1 and +2 line management) Assistive device: Walker-rolling    Walk 150 feet activity   Assist Walk 150 feet activity did not occur: Refused         Walk 10 feet on uneven surface  activity   Assist Walk 10 feet on uneven surfaces activity did not occur: Refused         Wheelchair     Assist Will patient use wheelchair at discharge?: Yes Type of Wheelchair: Manual Wheelchair activity did not occur: Refused         Wheelchair 50 feet with 2 turns activity    Assist             Wheelchair 150 feet activity     Assist           Medical Problem List and Plan: 1. 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  Continue CIR 2.  Antithrombotics: -DVT/anticoagulation: Subcutaneous heparin             -antiplatelet therapy: N/A 3. Pain Management: Tylenol as needed 4. Mood: Provide emotional support             -antipsychotic agents: Seroquel 25 mg twice daily   Noted to be agitated at times with therapies, will continue to monitor             -pt is fall risk 5. Neuropsych: This patient is not capable of making decisions on her own behalf.  Ritalin twice daily started on 2/2 with improvement 6. Skin/Wound Care: Routine skin checks 7. Fluids/Electrolytes/Nutrition: Routine in and outs  BMP within acceptable range on 1/31 8. Seizure prophylaxis.   Keppra decreased to 500 BID on 1/28, decreased further on 2/2, continue to wean 9. 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 Constance Holster.   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 Earleen Newport.       Dietary and speech therapy follow-up  Advanced diet as tolerated 11. Hyperglycemia related to tube feeds. SSI   CBG (last 3)  Recent Labs    05/01/20 0004 05/01/20 0358 05/01/20 0816  GLUCAP 93 110* 160*   Slightly elevated on 2/3 12. Hypothyroidism: Syndthroid 41. Hypertension. Hydralazine 50 mg every 8 hours, Normodyne 300 mg 3 times daily, Norvasc 10 mg daily   Vitals:   04/30/20 2222 05/01/20 0356  BP:  127/73  Pulse: 86 80  Resp: 20  19  Temp:  98.7 F (37.1 C)  SpO2: 94% 100%   Controlled on 2/3  Monitor with increased mobility  LOS: 9 days A FACE TO FACE EVALUATION WAS PERFORMED  Bali Lyn Lorie Phenix 05/01/2020, 9:29 AM

## 2020-05-01 NOTE — Progress Notes (Signed)
Physical Therapy Session Note  Patient Details  Name: Stacy Moran MRN: [id number] Date of Birth: 1955/11/28  Today's Date: 05/01/2020 PT Individual Time: 6606-3016 PT Individual Time Calculation (min): 55 min   Short Term Goals: Week 2:  PT Short Term Goal 1 (Week 2): Pt will consistently complete bed mobility with CGA PT Short Term Goal 2 (Week 2): Pt will consistently perform bed<>chair transfers with minA and LRAD PT Short Term Goal 3 (Week 2): Pt will ambulate 149ft with minA and LRAD PT Short Term Goal 4 (Week 2): Pt will initiate stair training PT Short Term Goal 5 (Week 2): Pt will demonstrate improved safety awareness and decreased impuslvity with functional mobility tasks  Skilled Therapeutic Interventions/Progress Updates:     1st session: Entered patient's room with pt sleeping and lights off. Pt awakens easily to voice. SpO2 97% with HR 74, connected to 5L O2 via trach mask. Pt unable to maintain adequate alertness to functionally participate in therapy as she kept dosing off. Pt missed 30 minutes of skilled therapy.  2nd session: Pt received sitting upright in w/c, awake and agreeable to therapy. Appears much more alert compared to AM. SPo2 97% on 5L wall oxygen via trach mask. Removed pelvic restraint and safety belt alarm and these were reapplied at end of session. Coordinated with nursing to pause her PEG feedings for safety during functional gait training. Connected to 6L portable tank for session. W/c transport to main hallway. Gait training 2x145ft with minA and +2 for chair follow for safety. Gait with bilateral hand-held assist with via face-to-face technique and patient grasping therapist's elbows. Therapist facilitating lateral weight shifts and also promoting forward gait and limiting distractions as able. Falls risk elevated when distractions in play as she continues to reach for various objects or wave to people while ambulating, causing her to have LOB's. Required x2  instances of external suction 2/2 thick secretions from trach site. Required minA for stand<>pivot transfer to [place] where she completed for only a few minutes as she way too distracted from arm/leg coordination as well as the electronic panel. Required max cueing for attending to task but limited carryover. Stand<>pivot with minA back to her w/c and she was returned to her room with Solomon. Returned to wall oxygen at 5L, reapplied pelvic restraint and safety belt alarm, provided external suction, needs within reach.  Therapy Documentation Precautions:  Precautions Precautions: Fall Precaution Comments: trach 28%/5L; pelvic restraint in bed, PMSV with staff, very low voice quality Required Braces or Orthoses: Other Brace Other Brace: abdominal binder - for PEG Restrictions Weight Bearing Restrictions: No General: PT Amount of Missed Time (min): 30 Minutes PT Missed Treatment Reason: Patient unwilling to participate;Patient fatigue  Therapy/Group: Individual Therapy  Noe Goyer P Breyer Tejera PT 05/01/2020, 7:28 AM

## 2020-05-01 NOTE — Plan of Care (Signed)
°  Problem: Safety: Goal: Non-violent Restraint(s) Outcome: Progressing   Problem: Consults Goal: RH STROKE PATIENT EDUCATION Description: See Patient Education module for education specifics  Outcome: Progressing   Problem: RH BOWEL ELIMINATION Goal: RH STG MANAGE BOWEL WITH ASSISTANCE Description: STG Manage Bowel with  min Assistance. Outcome: Progressing   Problem: RH BLADDER ELIMINATION Goal: RH STG MANAGE BLADDER WITH ASSISTANCE Description: STG Manage Bladder With min Assistance Outcome: Progressing Goal: RH STG MANAGE BLADDER WITH MEDICATION WITH ASSISTANCE Description: STG Manage Bladder With Medication With mod I Assistance. Outcome: Progressing   Problem: RH SKIN INTEGRITY Goal: RH STG SKIN FREE OF INFECTION/BREAKDOWN Description: With min assist Outcome: Progressing   Problem: RH SAFETY Goal: RH STG ADHERE TO SAFETY PRECAUTIONS W/ASSISTANCE/DEVICE Description: STG Adhere to Safety Precautions With  cues/reminders Assistance/Device. Outcome: Progressing   Problem: RH PAIN MANAGEMENT Goal: RH STG PAIN MANAGED AT OR BELOW PT'S PAIN GOAL Description: Pain at or below level 4 Outcome: Progressing   Problem: RH KNOWLEDGE DEFICIT Goal: RH STG INCREASE KNOWLEDGE OF DIABETES Description: Patient and daughter will be able to manage DM with medications and dietary modifications using handouts and education materials Outcome: Progressing Goal: RH STG INCREASE KNOWLEDGE OF HYPERTENSION Description: Patient and daughter will be able to manage HTN with medications and dietary modifications using handouts and education materials Outcome: Progressing Goal: RH STG INCREASE KNOWLEDGE OF DYSPHAGIA/FLUID INTAKE Description: Patient and daughter will be able to manage dysphagia with medications and dietary modifications using handouts and education materials Outcome: Progressing Goal: RH STG INCREASE KNOWLEDGE OF STROKE PROPHYLAXIS Description: Patient and daughter will be able  to manage secondary stroke risks with medications and dietary modifications using handouts and education materials Outcome: Progressing   

## 2020-05-02 LAB — GLUCOSE, CAPILLARY
Glucose-Capillary: 121 mg/dL — ABNORMAL HIGH (ref 70–99)
Glucose-Capillary: 136 mg/dL — ABNORMAL HIGH (ref 70–99)
Glucose-Capillary: 142 mg/dL — ABNORMAL HIGH (ref 70–99)
Glucose-Capillary: 137 mg/dL — ABNORMAL HIGH (ref 70–99)
Glucose-Capillary: 101 mg/dL — ABNORMAL HIGH (ref 70–99)

## 2020-05-02 NOTE — Progress Notes (Signed)
Occupational Therapy Session Note  Patient Details  Name: Stacy Moran MRN: [id number] Date of Birth: February 11, 1956  Today's Date: 05/02/2020 OT Individual Time: 3704-8889 OT Individual Time Calculation (min): 60 min    Short Term Goals: Week 2:  OT Short Term Goal 1 (Week 2): Pt will complete bathroom transfer with min HHA OT Short Term Goal 2 (Week 2): Pt will complete LB dressing with min A OT Short Term Goal 3 (Week 2): Pt will require no more than min cueing for focused attention to ADL task OT Short Term Goal 4 (Week 2): Pt will complete sinkside bathing with min assist and mod cueing.  Skilled Therapeutic Interventions/Progress Updates:    Pt received in bed sleeping but awoke fairly easily. Initially very difficult to get pt started as she kept pulling the covers over her body.  Using max A to guide pt to sit EOB, pt then began to participate.  Stand pivot to wc with min A.  Challenging set up as pt was connected to feeding tube, trach O2 (had to find portable tank set up), and pulse ox attached to her toe.   Pt taken to sink to engage in bathing at mirror at sink. Much improved praxis, initiation and attention this session. Pt continues to be very impulsive.   Assisted pt with donning shirt over head and then she completed. Obtained a new binder for her as the other one was quite soiled.  +2 A arrived as transfers difficult with multiple lines.  Pt said no she did not need to toilet but I wanted her to try. Had pt do a st pivot to [facility] with min and then max with toileting as she was already soaked in her brief.  Pt did cleanse self. Mod a to don pants and socks.   Transferred back to wc.  Posey belt on, belt alarm on. Pt set up in wc with all needs met.   Therapy Documentation Precautions:  Precautions Precautions: Fall Precaution Comments: trach 28%/5L; pelvic restraint in bed, PMSV with staff, very low voice quality Required Braces or Orthoses: Other Brace Other Brace: abdominal  binder - for PEG Restrictions Weight Bearing Restrictions: No    Vital Signs: Therapy Vitals Pulse Rate: 84 Resp: 18 Patient Position (if appropriate): Lying Oxygen Therapy SpO2: 98 % O2 Device: Tracheostomy Collar FiO2 (%): 28 % Pain: Pain Assessment Pain Score: 0-No pain   Therapy/Group: Individual Therapy  Wellman 05/02/2020, 1:07 PM

## 2020-05-02 NOTE — Progress Notes (Signed)
[facility] PHYSICAL MEDICINE & REHABILITATION PROGRESS NOTE  Subjective/Complaints: Patient seen sitting up in her chair this morning working with therapies.  She states she slept well overnight.  Discussed attention with therapies.  Patient wants to know when she can go home.  ROS: Limited due to cognition.  Objective: Vital Signs: Blood pressure 125/78, pulse 82, temperature 98.4 F (36.9 C), resp. rate 18, height 5\' 9"  (1.753 m), weight 59.3 kg, SpO2 98 %. No results found. No results for input(s): WBC, HGB, HCT, PLT in the last 72 hours. No results for input(s): NA, K, CL, CO2, GLUCOSE, BUN, CREATININE, CALCIUM in the last 72 hours.  Intake/Output Summary (Last 24 hours) at 05/02/2020 1019 Last data filed at 05/02/2020 0852 Gross per 24 hour  Intake 0 ml  Output --  Net 0 ml        Physical Exam: BP 125/78 (BP Location: Left Arm)   Pulse 82   Temp 98.4 F (36.9 C)   Resp 18   Ht 5\' 9"  (1.753 m)   Wt 59.3 kg   SpO2 98%   BMI 19.31 kg/m  Constitutional: No distress . Vital signs reviewed. HENT:  Scalp with VPS Neck: + #6 cuffless trach with trach collar Eyes: EOMI. No discharge. Cardiovascular: No JVD.  RRR. Respiratory: Normal effort.  No stridor.  Bilateral clear to auscultation. GI: Non-distended.  BS +.  + PEG. Skin: Warm and dry.  Intact. Psych: Flat.  Normal behavior. Musc: No edema in extremities.  No tenderness in extremities. Neuro: Alert Motor: 4+-5/5 throughout, unchanged  Assessment/Plan: 1. Functional deficits which require 3+ hours per day of interdisciplinary therapy in a comprehensive inpatient rehab setting.  Physiatrist is providing close team supervision and 24 hour management of active medical problems listed below.  Physiatrist and rehab team continue to assess barriers to discharge/monitor patient progress toward functional and medical goals   Care Tool:  Bathing    Body parts bathed by patient: Right arm,Left arm,Chest,Abdomen,Right  upper leg,Left upper leg,Right lower leg,Left lower leg,Face,Front perineal area,Buttocks   Body parts bathed by helper: Front perineal area,Buttocks     Bathing assist Assist Level: 2 Helpers     Upper Body Dressing/Undressing Upper body dressing   What is the patient wearing?: Pull over shirt    Upper body assist Assist Level: Moderate Assistance - Patient 50 - 74%    Lower Body Dressing/Undressing Lower body dressing      What is the patient wearing?: Incontinence brief,Pants     Lower body assist Assist for lower body dressing: Total Assistance - Patient < 25%     Toileting Toileting    Toileting assist Assist for toileting: Minimal Assistance - Patient > 75% (mod cues)     Transfers Chair/bed transfer  Transfers assist     Chair/bed transfer assist level: Moderate Assistance - Patient 50 - 74%     Locomotion Ambulation   Ambulation assist      Assist level: 2 helpers (mod A of 1 and +2 line management) Assistive device: Walker-rolling Max distance: 160ft   Walk 10 feet activity   Assist  Walk 10 feet activity did not occur: Refused  Assist level: 2 helpers (mod A of 1 and +2 line management) Assistive device: Walker-rolling   Walk 50 feet activity   Assist Walk 50 feet with 2 turns activity did not occur: Refused  Assist level: 2 helpers (mod A of 1 and +2 line management) Assistive device: Walker-rolling    Walk 150  feet activity   Assist Walk 150 feet activity did not occur: Refused         Walk 10 feet on uneven surface  activity   Assist Walk 10 feet on uneven surfaces activity did not occur: Refused         Wheelchair     Assist Will patient use wheelchair at discharge?: Yes Type of Wheelchair: Manual Wheelchair activity did not occur: Refused         Wheelchair 50 feet with 2 turns activity    Assist            Wheelchair 150 feet activity     Assist           Medical Problem List  and Plan: 1. 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  Continue CIR 2.  Antithrombotics: -DVT/anticoagulation: Subcutaneous heparin             -antiplatelet therapy: N/A 3. Pain Management: Tylenol as needed 4. Mood: Provide emotional support             -antipsychotic agents: Seroquel 25 mg twice daily   Noted to be agitated at times with therapies, appears to be improving             -pt is fall risk 5. Neuropsych: This patient is not capable of making decisions on her own behalf.  Ritalin twice daily started on 2/2 with improvement 6. Skin/Wound Care: Routine skin checks 7. Fluids/Electrolytes/Nutrition: Routine in and outs  BMP within acceptable range on 1/31 8. Seizure prophylaxis.   Keppra decreased to 500 BID on 1/28, decreased further on 2/2, plan to DC next week 9. 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 Constance Holster.   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 Earleen Newport.       Dietary and speech therapy follow-up  Advanced diet as tolerated 11. Hyperglycemia related to tube feeds. SSI   CBG (last 3)  Recent Labs    05/01/20 2356 05/02/20 0429 05/02/20 0832  GLUCAP 89 121* 136*   Mildly elevated on 2/4 12. Hypothyroidism: Syndthroid 52. Hypertension. Hydralazine 50 mg every 8 hours, Normodyne 300 mg 3 times daily, Norvasc 10 mg daily   Vitals:   05/02/20 0513 05/02/20 0731  BP:    Pulse:  82  Resp:  18  Temp:    SpO2: 100% 98%   Controlled on 2/4  Monitor with increased mobility  LOS: 10 days A  FACE TO FACE EVALUATION WAS PERFORMED  Charlene Detter Lorie Phenix 05/02/2020, 10:19 AM

## 2020-05-02 NOTE — Progress Notes (Signed)
Speech Language Pathology Daily Session Note  Patient Details  Name: Stacy Moran MRN: [id number] Date of Birth: [birth year]  Today's Date: 05/02/2020 SLP Individual Time: 1100-1145 SLP Individual Time Calculation (min): 45 min  Short Term Goals: Week 2: SLP Short Term Goal 1 (Week 2): Pt will tolerate PMSV for 30 minutes without s/s of distress over x3 opportunites. SLP Short Term Goal 2 (Week 2): Pt will demonstrate sustained attention in 25 minute intervals with mod A multimodal cues in 3 sessions. SLP Short Term Goal 3 (Week 2): Pt will follow 1 step commands with mod A multimodal cues in 80% of opportunties. SLP Short Term Goal 4 (Week 2): Pt will consume trials of ice chips with moderate s/s aspiration and reduced expeling of mucus secretions, prior to repeat MBS. SLP Short Term Goal 5 (Week 2): Pt will communicate wants/needs at written word level with mod A verbal cues in 70% of opportunties. SLP Short Term Goal 6 (Week 2): Pt will phonate at the phenome level x2 with max A multimodal cues.  Skilled Therapeutic Interventions:   Patient seen to address speech-language cognitive goals. SpO2 saturations continue to be in high 90's% with trach collar. Today, she was not exhibiting the frequency of wet sounding tracheal secretions did have some questionable wheezing. She was only able to to achieve one instance of glottal sounding, low intensity voicing. SLP donned PMV on patient for total of 15 minutes. Patient was attempting to verbally communicate something to SLP but even with SLP's ear close to her mouth, could not hear or understand well enough. She was asked to write down what she was trying to say and she wrote, "I can" then was not able to finish. She proceeded to then drop the marker and exhibit lack of functional fine motor movements with fingers. (seemed behavioral rather than a functional change). SLP was not able to redirect patient to continue with attempts to communicate. Patient  continues to benefit from skilled SLP intervention to maximize cognitive-linguistic and swallow function.  Pain Pain Assessment Pain Scale: Faces Pain Score: 0-No pain Faces Pain Scale: No hurt  Therapy/Group: Individual Therapy  Sonia Baller, MA, CCC-SLP Speech Therapy

## 2020-05-02 NOTE — Progress Notes (Signed)
Orthopedic Tech Progress Note Patient Details:  Stacy Moran [birth year] [id number] Was called requesting an ABDOMINAL BINDER for patient. Handled it to the THERAPIST that was working with her at that moment.  Ortho Devices Type of Ortho Device: Abdominal binder Ortho Device/Splint Location: STOMACH Ortho Device/Splint Interventions: Ordered,Other (comment)   Post Interventions Patient Tolerated: Well Instructions Provided: [facility] [name] 05/02/2020, 10:37 AM

## 2020-05-02 NOTE — Progress Notes (Signed)
Physical Therapy Session Note  Patient Details  Name: Stacy Moran MRN: [id number] Date of Birth: 1955-06-18  Today's Date: 05/02/2020 PT Individual Time: 2353-6144 PT Individual Time Calculation (min): 58 min   Short Term Goals: Week 2:  PT Short Term Goal 1 (Week 2): Pt will consistently complete bed mobility with CGA PT Short Term Goal 2 (Week 2): Pt will consistently perform bed<>chair transfers with minA and LRAD PT Short Term Goal 3 (Week 2): Pt will ambulate 191ft with minA and LRAD PT Short Term Goal 4 (Week 2): Pt will initiate stair training PT Short Term Goal 5 (Week 2): Pt will demonstrate improved safety awareness and decreased impuslvity with functional mobility tasks  Skilled Therapeutic Interventions/Progress Updates:     Pt greeted supine in bed, sleeping on arrival and awakens easily to voice. She required a lot of encouragement to participate, unable to clearly understand her reasoning to resistance but ultimately she was agreeable. Coordinated with RN to pause PEG feedings for session. Pelvic restraint donned and on 5L wall O2 via trach mask. Noted thick secretions on arrival and required external suctioning for relief - she's able to produce somewhat productive cough for clearing when prompted. Supine<>sit with minA, primarily for initiation and participating. x1 effort at returning to bed but this was redirectable. Performed stand<>pivot transfer with minA and no AD from EOB to w/c and she was connected to 6L portable O2 via trach mask for remainder of our session.  Gait training x246ft (!) with minA via bilateral hand-held assist (therapist facing patient and patient grasping therapist's elbow's) with +2 assist for chair follow for safety. Therapist cueing for postural awareness due to forward flexed trunk as well as improving cadence and gait speed - pt somewhat receptive to feedback but due to distractions she was unable to maintain. Performed 2nd gait trial of ~66ft but  patient was incontinent of bladder, without warning, in the hallway. Returned to her room in her w/c and ambulated in her room with minA in similar fashion as in the hallway. Required modA for safety approach to sit on 3-1 BSC. Pt voided, again, while on the toilet and was able to complete pericare with setupA. Ambulated with minA to the sink where she was able to wash hands with CGA for steadying. She was also able to wash her face with washcloth with seutpA but minA for balance while her eyes were closed. Pt requesting to perform an additional gait session so she ambulated around ~18ft from her room and back with minA via bilateral hand held assist and +2 for chair follow. She ended session seated in w/c with pelvic restraint donned, safety belt alarm on, reconnected to wall oxygen. She required x3 external suctions during session due to secretions from trach site.   Therapy Documentation Precautions:  Precautions Precautions: Fall Precaution Comments: trach 28%/5L; pelvic restraint in bed, PMSV with staff, very low voice quality Required Braces or Orthoses: Other Brace Other Brace: abdominal binder - for PEG Restrictions Weight Bearing Restrictions: No  Therapy/Group: Individual Therapy  Alger Simons PT, DPT 05/02/2020, 7:31 AM

## 2020-05-03 LAB — GLUCOSE, CAPILLARY
Glucose-Capillary: 115 mg/dL — ABNORMAL HIGH (ref 70–99)
Glucose-Capillary: 117 mg/dL — ABNORMAL HIGH (ref 70–99)
Glucose-Capillary: 123 mg/dL — ABNORMAL HIGH (ref 70–99)
Glucose-Capillary: 124 mg/dL — ABNORMAL HIGH (ref 70–99)
Glucose-Capillary: 125 mg/dL — ABNORMAL HIGH (ref 70–99)
Glucose-Capillary: 128 mg/dL — ABNORMAL HIGH (ref 70–99)

## 2020-05-03 NOTE — Plan of Care (Signed)
  Problem: Safety: Goal: Non-violent Restraint(s) Outcome: Progressing   Problem: Consults Goal: RH STROKE PATIENT EDUCATION Description: See Patient Education module for education specifics  Outcome: Progressing   Problem: RH BLADDER ELIMINATION Goal: RH STG MANAGE BLADDER WITH ASSISTANCE Description: STG Manage Bladder With min Assistance Outcome: Progressing Goal: RH STG MANAGE BLADDER WITH MEDICATION WITH ASSISTANCE Description: STG Manage Bladder With Medication With mod I Assistance. Outcome: Progressing   Problem: RH SKIN INTEGRITY Goal: RH STG SKIN FREE OF INFECTION/BREAKDOWN Description: With min assist Outcome: Progressing   Problem: RH SAFETY Goal: RH STG ADHERE TO SAFETY PRECAUTIONS W/ASSISTANCE/DEVICE Description: STG Adhere to Safety Precautions With  cues/reminders Assistance/Device. Outcome: Progressing   Problem: RH PAIN MANAGEMENT Goal: RH STG PAIN MANAGED AT OR BELOW PT'S PAIN GOAL Description: Pain at or below level 4 Outcome: Progressing   Problem: RH KNOWLEDGE DEFICIT Goal: RH STG INCREASE KNOWLEDGE OF DIABETES Description: Patient and daughter will be able to manage DM with medications and dietary modifications using handouts and education materials Outcome: Progressing Goal: RH STG INCREASE KNOWLEDGE OF HYPERTENSION Description: Patient and daughter will be able to manage HTN with medications and dietary modifications using handouts and education materials Outcome: Progressing Goal: RH STG INCREASE KNOWLEDGE OF DYSPHAGIA/FLUID INTAKE Description: Patient and daughter will be able to manage dysphagia with medications and dietary modifications using handouts and education materials Outcome: Progressing Goal: RH STG INCREASE KNOWLEDGE OF STROKE PROPHYLAXIS Description: Patient and daughter will be able to manage secondary stroke risks with medications and dietary modifications using handouts and education materials Outcome: Progressing   Problem: RH  BOWEL ELIMINATION Goal: RH STG MANAGE BOWEL WITH ASSISTANCE Description: STG Manage Bowel with  min Assistance. Outcome: Not Progressing

## 2020-05-03 NOTE — Progress Notes (Signed)
Occupational Therapy Session Note  Patient Details  Name: Stacy Moran MRN: [id number] Date of Birth: November 26, 1955  Today's Date: 05/03/2020 OT Individual Time: 1300-1330 OT Individual Time Calculation (min): 30 min    Short Term Goals: Week 1:  OT Short Term Goal 1 (Week 1): Pt will sit EOB with no LOB during 1 ADL task OT Short Term Goal 1 - Progress (Week 1): Met OT Short Term Goal 2 (Week 1): Pt will complete bathroom transfer with min HHA OT Short Term Goal 2 - Progress (Week 1): Progressing toward goal OT Short Term Goal 3 (Week 1): Pt will complete LB dressing with min A OT Short Term Goal 3 - Progress (Week 1): Progressing toward goal OT Short Term Goal 4 (Week 1): Pt will require no more than min cueing for focused attention to ADL task OT Short Term Goal 4 - Progress (Week 1): Progressing toward goal  Skilled Therapeutic Interventions/Progress Updates:The main objective this session was attempt toilet transfer goal.  As well nursing staff reported patient would not allow them to offer toileting.   Patient concurred during OT session.   So, going from supine with head of bed elevated to /frm [facility] transfer [name] completed stand step pivot with moderate assistance and extra time.   She required much work tosequence the supine to edge of bed transfer as well as bed to/fr bed side commode.   She had difficulty with motor planning in addition to sequencing and initiation.    Patient required very close monitoring of her right upper extremity once the safety mit was removed.  She continually tried to doff her oxygen at her neck and  the pulse oxemeter monitor that was taped to her left hand.  Patient was able to complete expressive communication to ask for more time to complete both void and BM (she was successful with both).     At end of session, patient required her right wrist retraint/mitt to be tied tighter because he was able to slide it off when she was left ample room for  comfort.    She did not attempt to slide off after the snugger tie/wrap.  At the end of the session, she was left lying upright with head of bed elevated approximately 35 degrees at the end of the session and waist restraints retied around her waist.  Continue OT Plan of care     Therapy Documentation Precautions:  Precautions Precautions: Fall Precaution Comments: trach 28%/5L; pelvic restraint in bed, PMSV with staff, very low voice quality Required Braces or Orthoses: Other Brace Other Brace: abdominal binder - for PEG Restrictions Weight Bearing Restrictions: No   Pain:denied   Therapy/Group: Individual Therapy  Herschell Dimes 05/03/2020, 3:49 PM

## 2020-05-03 NOTE — Progress Notes (Signed)
Physical Therapy Session Note  Patient Details  Name: Stacy Moran MRN: [id number] Date of Birth: June 22, 1955  Today's Date: 05/03/2020 PT Individual Time: 1015-1035 PT Individual Time Calculation (min): 20 min   Short Term Goals: Week 2:  PT Short Term Goal 1 (Week 2): Pt will consistently complete bed mobility with CGA PT Short Term Goal 2 (Week 2): Pt will consistently perform bed<>chair transfers with minA and LRAD PT Short Term Goal 3 (Week 2): Pt will ambulate 144ft with minA and LRAD PT Short Term Goal 4 (Week 2): Pt will initiate stair training PT Short Term Goal 5 (Week 2): Pt will demonstrate improved safety awareness and decreased impuslvity with functional mobility tasks Week 3:     Skilled Therapeutic Interventions/Progress Updates:    Patient supine in bed upon PT arrival. Patient asleep and not easily woken. Upon brief awakening, she asks what time it is and falls back asleep. Attempt to wake pt again with "good morning" and pt responds with "good morning". Pt given praise on good enunciation and immediately smiles and falls back asleep. Continued multimodal attempts to wake pt including warm washcloth on face with only brief periods of arousal no more than 5sec.  Pt's continuous HR/ pulse ox monitor alarm sets off and pulse ox tape noted to be at foot of bed. Nursing notified. Discussion with nursing re: pt's status with nursing stating that they are not familiar with pt but she has been reacting similarly this morning with RN and nursing student. NT states that pt has been improving but does tend to have days where she is difficult to rouse. Pt asleep in bed with bed alarm on and all needs within reach.  Therapy Documentation Precautions:  Precautions Precautions: Fall Precaution Comments: trach 28%/5L; pelvic restraint in bed, PMSV with staff, very low voice quality Required Braces or Orthoses: Other Brace Other Brace: abdominal binder - for PEG Restrictions Weight Bearing  Restrictions: No   Therapy/Group: Individual Therapy  Alger Simons 05/03/2020, 12:52 PM

## 2020-05-04 LAB — GLUCOSE, CAPILLARY
Glucose-Capillary: 105 mg/dL — ABNORMAL HIGH (ref 70–99)
Glucose-Capillary: 111 mg/dL — ABNORMAL HIGH (ref 70–99)
Glucose-Capillary: 111 mg/dL — ABNORMAL HIGH (ref 70–99)
Glucose-Capillary: 112 mg/dL — ABNORMAL HIGH (ref 70–99)
Glucose-Capillary: 117 mg/dL — ABNORMAL HIGH (ref 70–99)
Glucose-Capillary: 125 mg/dL — ABNORMAL HIGH (ref 70–99)
Glucose-Capillary: 133 mg/dL — ABNORMAL HIGH (ref 70–99)

## 2020-05-04 NOTE — Progress Notes (Signed)
Occupational Therapy Session Note  Patient Details  Name: Iliza Blankenbeckler MRN: [id number] Date of Birth: [birth year]  Today's Date: 05/04/2020 OT Individual Time: 3419-3790 OT Individual Time Calculation (min): 41 min   Short Term Goals: Week 2:  OT Short Term Goal 1 (Week 2): Pt will complete bathroom transfer with min HHA OT Short Term Goal 2 (Week 2): Pt will complete LB dressing with min A OT Short Term Goal 3 (Week 2): Pt will require no more than min cueing for focused attention to ADL task OT Short Term Goal 4 (Week 2): Pt will complete sinkside bathing with min assist and mod cueing.  Skilled Therapeutic Interventions/Progress Updates:    Pt greeted in bed, awake and alert, reaching for OT's hand and shaking her hand. PMSV was then placed with pt actively trying to verbalize, however voice too soft to understand what she was saying. Tried to use the small white board to communicate needs with pt writing sentences when cued to provide one word. She indicated that she did not need to use the restroom. When asked if she wanted to get out of bed pt wrote both yes and no. OT facilitated supine<sit with Mod A where pt was set up with wash cloth to wash face given setup assist. 02 sats at this time in the 90s via trach collar/continuous 02 monitor. Note that she coughed and PMSV shot out across the room. Pt provided with suction and PMSV remained off for remainder of session. Ambulatory transfer to sink completed without AD and Min A, 2nd person present for safety and managing portable 02 tank. Pt required vcs and manual assistance to not drink the faucet water when washing hands. The same assistance required for ambulatory transfer back to bed, manual facilitation for Rt sided weightshifting to improve Lt foot clearance. While seated EOB, pt used the suction toothbrush to complete oral care with supervision assist. Supervision for applying lotion to upper arms with pt dropping the cap of the bottle on  the floor. Pt pleasant and cooperative throughout session though did note some impulsivity with movement. At end of tx pt returned to bed and was left with all needs within reach, [facility] set at 30 degrees, bed alarm set, fall mats in place, and waist restraint fastened.   02 sats throughout session 91% and above via trach collar  Therapy Documentation Precautions:  Precautions Precautions: Fall Precaution Comments: trach 28%/5L; pelvic restraint in bed, PMSV with staff, very low voice quality Required Braces or Orthoses: Other Brace Other Brace: abdominal binder - for PEG Restrictions Weight Bearing Restrictions: No Vital Signs: Therapy Vitals Temp: 98.4 F (36.9 C) Temp Source: Oral Pulse Rate: 77 Resp: 16 BP: 101/60 Patient Position (if appropriate): Lying Oxygen Therapy SpO2: 100 % O2 Device: Tracheostomy Collar O2 Flow Rate (L/min): 5 L/min FiO2 (%): 28 % Pain: pt shook head when asked if she had pain   ADL: ADL Eating: NPO Grooming: Minimal assistance Where Assessed-Grooming: Edge of bed Upper Body Bathing: Minimal assistance,Moderate cueing Where Assessed-Upper Body Bathing: Edge of bed Lower Body Bathing: Minimal assistance,Moderate cueing Where Assessed-Lower Body Bathing: Edge of bed Upper Body Dressing: Moderate cueing,Minimal assistance Where Assessed-Upper Body Dressing: Edge of bed Lower Body Dressing: Moderate cueing,Moderate assistance Where Assessed-Lower Body Dressing: Edge of bed Toileting: Moderate assistance,Moderate cueing Where Assessed-Toileting: Glass blower/designer: Moderate assistance,Moderate verbal cueing Toilet Transfer Method: Counselling psychologist: Grab bars,Bedside commode      Therapy/Group: Individual Therapy  Aubriel Khanna A Genevia Bouldin 05/04/2020, 3:53 PM

## 2020-05-04 NOTE — Progress Notes (Signed)
[place] PHYSICAL MEDICINE & REHABILITATION PROGRESS NOTE  Subjective/Complaints: Patient seen laying in bed this morning.  She states she slept well overnight.  She appears to deny complaints.  No issues overnight.  ROS: Limited due to language  Objective: Vital Signs: Blood pressure 116/63, pulse 72, temperature 98.2 F (36.8 C), resp. rate 20, height 5\' 9"  (1.753 m), weight 59.3 kg, SpO2 100 %. No results found. No results for input(s): WBC, HGB, HCT, PLT in the last 72 hours. No results for input(s): NA, K, CL, CO2, GLUCOSE, BUN, CREATININE, CALCIUM in the last 72 hours.  Intake/Output Summary (Last 24 hours) at 05/04/2020 1359 Last data filed at 05/04/2020 0300 Gross per 24 hour  Intake 2400 ml  Output --  Net 2400 ml        Physical Exam: BP 116/63 (BP Location: Right Arm)   Pulse 72   Temp 98.2 F (36.8 C)   Resp 20   Ht 5\' 9"  (1.753 m)   Wt 59.3 kg   SpO2 100%   BMI 19.31 kg/m  Constitutional: No distress . Vital signs reviewed. HENT: Atraumatic. Scalp with VPS Neck: + #6 cuffless trach with trach collar Eyes: EOMI. No discharge. Cardiovascular: No JVD.  RRR. Respiratory: Normal effort.  No stridor.  Bilateral clear to auscultation. GI: Non-distended.  BS +.  + PEG. Skin: Warm and dry.  Intact. Psych: Flat.  Normal behavior. Musc: No edema in extremities.  No tenderness in extremities. Neuro: Alert Motor: 4+-5/5 throughout, stable  Assessment/Plan: 1. Functional deficits which require 3+ hours per day of interdisciplinary therapy in a comprehensive inpatient rehab setting.  Physiatrist is providing close team supervision and 24 hour management of active medical problems listed below.  Physiatrist and rehab team continue to assess barriers to discharge/monitor patient progress toward functional and medical goals   Care Tool:  Bathing    Body parts bathed by patient: Right arm,Left arm,Chest,Abdomen,Right upper leg,Left upper leg,Right lower leg,Left  lower leg,Face,Front perineal area,Buttocks   Body parts bathed by helper: Front perineal area,Buttocks     Bathing assist Assist Level: Minimal Assistance - Patient > 75%     Upper Body Dressing/Undressing Upper body dressing   What is the patient wearing?: Pull over shirt    Upper body assist Assist Level: Minimal Assistance - Patient > 75%    Lower Body Dressing/Undressing Lower body dressing      What is the patient wearing?: Incontinence brief,Pants     Lower body assist Assist for lower body dressing: Moderate Assistance - Patient 50 - 74%     Toileting Toileting    Toileting assist Assist for toileting: Maximal Assistance - Patient 25 - 49%     Transfers Chair/bed transfer  Transfers assist     Chair/bed transfer assist level: Minimal Assistance - Patient > 75%     Locomotion Ambulation   Ambulation assist      Assist level: 2 helpers (mod A of 1 and +2 line management) Assistive device: Walker-rolling Max distance: 129ft   Walk 10 feet activity   Assist  Walk 10 feet activity did not occur: Refused  Assist level: 2 helpers (mod A of 1 and +2 line management) Assistive device: Walker-rolling   Walk 50 feet activity   Assist Walk 50 feet with 2 turns activity did not occur: Refused  Assist level: 2 helpers (mod A of 1 and +2 line management) Assistive device: Walker-rolling    Walk 150 feet activity   Assist Walk 150 feet  activity did not occur: Refused         Walk 10 feet on uneven surface  activity   Assist Walk 10 feet on uneven surfaces activity did not occur: Refused         Wheelchair     Assist Will patient use wheelchair at discharge?: Yes Type of Wheelchair: Manual Wheelchair activity did not occur: Refused         Wheelchair 50 feet with 2 turns activity    Assist            Wheelchair 150 feet activity     Assist           Medical Problem List and Plan: 1. 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  Continue CIR 2.  Antithrombotics: -DVT/anticoagulation: Subcutaneous heparin  CBC ordered for tomorrow             -antiplatelet therapy: N/A 3. Pain Management: Tylenol as needed 4. Mood: Provide emotional support             -antipsychotic agents: Seroquel 25 mg twice daily   Agitation improved             -pt is fall risk 5. Neuropsych: This patient is not capable of making decisions on her own behalf.  Ritalin twice daily started on 2/2 with improvement 6. Skin/Wound Care: Routine skin checks 7. Fluids/Electrolytes/Nutrition: Routine in and outs  BMP within acceptable range on 1/31, labs ordered for tomorrow 8. Seizure prophylaxis.   Keppra decreased to 500 BID on 1/28, decreased further on 2/2, plan to DC this week 9. 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 Constance Holster.   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 Earleen Newport.       Dietary and speech therapy follow-up  Advanced diet as tolerated 11. Hyperglycemia related to tube feeds. SSI   CBG (last 3)  Recent Labs    05/04/20 0417 05/04/20 0814 05/04/20 1135  GLUCAP 111* 133* 112*   Mildly elevated on 2/6 12. Hypothyroidism: Syndthroid 15. Hypertension. Hydralazine 50 mg every 8 hours, Normodyne 300 mg 3 times daily, Norvasc 10 mg daily   Vitals:   05/04/20 0845 05/04/20 1114  BP:    Pulse: 72   Resp: 18 20  Temp:    SpO2:     Controlled on 2/6  Monitor with increased mobility  LOS: 12 days A FACE TO FACE EVALUATION  WAS PERFORMED  [place] Lorie Phenix 05/04/2020, 1:59 PM

## 2020-05-04 NOTE — Plan of Care (Signed)
  Problem: Safety: Goal: Non-violent Restraint(s) Outcome: Progressing   Problem: Consults Goal: RH STROKE PATIENT EDUCATION Description: See Patient Education module for education specifics  Outcome: Progressing   Problem: RH BOWEL ELIMINATION Goal: RH STG MANAGE BOWEL WITH ASSISTANCE Description: STG Manage Bowel with  min Assistance. Outcome: Progressing   Problem: RH BLADDER ELIMINATION Goal: RH STG MANAGE BLADDER WITH ASSISTANCE Description: STG Manage Bladder With min Assistance Outcome: Progressing Goal: RH STG MANAGE BLADDER WITH MEDICATION WITH ASSISTANCE Description: STG Manage Bladder With Medication With mod I Assistance. Outcome: Progressing   Problem: RH SKIN INTEGRITY Goal: RH STG SKIN FREE OF INFECTION/BREAKDOWN Description: With min assist Outcome: Progressing   Problem: RH SAFETY Goal: RH STG ADHERE TO SAFETY PRECAUTIONS W/ASSISTANCE/DEVICE Description: STG Adhere to Safety Precautions With  cues/reminders Assistance/Device. Outcome: Progressing   Problem: RH PAIN MANAGEMENT Goal: RH STG PAIN MANAGED AT OR BELOW PT'S PAIN GOAL Description: Pain at or below level 4 Outcome: Progressing   Problem: RH KNOWLEDGE DEFICIT Goal: RH STG INCREASE KNOWLEDGE OF DIABETES Description: Patient and daughter will be able to manage DM with medications and dietary modifications using handouts and education materials Outcome: Progressing Goal: RH STG INCREASE KNOWLEDGE OF HYPERTENSION Description: Patient and daughter will be able to manage HTN with medications and dietary modifications using handouts and education materials Outcome: Progressing Goal: RH STG INCREASE KNOWLEDGE OF DYSPHAGIA/FLUID INTAKE Description: Patient and daughter will be able to manage dysphagia with medications and dietary modifications using handouts and education materials Outcome: Progressing Goal: RH STG INCREASE KNOWLEDGE OF STROKE PROPHYLAXIS Description: Patient and daughter will be able  to manage secondary stroke risks with medications and dietary modifications using handouts and education materials Outcome: Progressing

## 2020-05-05 LAB — GLUCOSE, CAPILLARY
Glucose-Capillary: 89 mg/dL (ref 70–99)
Glucose-Capillary: 127 mg/dL — ABNORMAL HIGH (ref 70–99)
Glucose-Capillary: 77 mg/dL (ref 70–99)
Glucose-Capillary: 147 mg/dL — ABNORMAL HIGH (ref 70–99)
Glucose-Capillary: 116 mg/dL — ABNORMAL HIGH (ref 70–99)

## 2020-05-05 LAB — CBC WITH DIFFERENTIAL/PLATELET
Abs Immature Granulocytes: 0.03 10*3/uL (ref 0.00–0.07)
Basophils Absolute: 0 10*3/uL (ref 0.0–0.1)
Basophils Relative: 0 %
Eosinophils Absolute: 0.1 10*3/uL (ref 0.0–0.5)
Eosinophils Relative: 1 %
HCT: 39 % (ref 36.0–46.0)
Hemoglobin: 11.9 g/dL — ABNORMAL LOW (ref 12.0–15.0)
Immature Granulocytes: 1 %
Lymphocytes Relative: 41 %
Lymphs Abs: 1.8 10*3/uL (ref 0.7–4.0)
MCH: 27.2 pg (ref 26.0–34.0)
MCHC: 30.5 g/dL (ref 30.0–36.0)
MCV: 89 fL (ref 80.0–100.0)
Monocytes Absolute: 0.4 10*3/uL (ref 0.1–1.0)
Monocytes Relative: 10 %
Neutro Abs: 2.1 10*3/uL (ref 1.7–7.7)
Neutrophils Relative %: 47 %
Platelets: 149 10*3/uL — ABNORMAL LOW (ref 150–400)
RBC: 4.38 MIL/uL (ref 3.87–5.11)
RDW: 14.8 % (ref 11.5–15.5)
WBC: 4.4 10*3/uL (ref 4.0–10.5)
nRBC: 0 % (ref 0.0–0.2)

## 2020-05-05 LAB — BASIC METABOLIC PANEL
Anion gap: 7 (ref 5–15)
BUN: 22 mg/dL (ref 8–23)
CO2: 34 mmol/L — ABNORMAL HIGH (ref 22–32)
Calcium: 9.9 mg/dL (ref 8.9–10.3)
Chloride: 100 mmol/L (ref 98–111)
Creatinine, Ser: 0.7 mg/dL (ref 0.44–1.00)
GFR, Estimated: >60 mL/min (ref >60)
Glucose, Bld: 97 mg/dL (ref 70–99)
Potassium: 4.2 mmol/L (ref 3.5–5.1)
Sodium: 141 mmol/L (ref 135–145)

## 2020-05-05 NOTE — Progress Notes (Signed)
Physical Therapy Session Note  Patient Details  Name: Stacy Moran MRN: [id number] Date of Birth: 1955-10-07  Today's Date: 05/05/2020 PT Individual Time: 3151-7616 + 0737-1062 PT Individual Time Calculation (min): 54 min  + 23 min  Short Term Goals: Week 2:  PT Short Term Goal 1 (Week 2): Pt will consistently complete bed mobility with CGA PT Short Term Goal 2 (Week 2): Pt will consistently perform bed<>chair transfers with minA and LRAD PT Short Term Goal 3 (Week 2): Pt will ambulate 150ft with minA and LRAD PT Short Term Goal 4 (Week 2): Pt will initiate stair training PT Short Term Goal 5 (Week 2): Pt will demonstrate improved safety awareness and decreased impuslvity with functional mobility tasks  Skilled Therapeutic Interventions/Progress Updates:    1st session: Pt greeted supine in bed, sleeping on arrival with lights off, awakens to voice, agreeable to therapy. Coordinated with RN to pause PEG feedings. Shakes head 'no' to pain questions. Required multi-modal cues for maintaining alertness. Pt without any clothes on but the pelvic restraint. SpO2 100% and HR 70's while resting on 5L wall oxygen via trach mask. Provided her with scrub pants which she was able to don with minA while supine, primarily for threading. Supine<>sit with minA, primarily for initiation. Able to sit EOB with CGA with noted L lateral/posterior lean. She was able to don socks with minA due to motor apraxia. She was able to don t-shirt with minA while seated EOB. Stand<>pivot transfer with modA from EOB to w/c with cues for step-by-step sequencing. Connected to 6L portable O2 for remainder of session. When asked if she needed to toilet, she shook head 'yes.' Ambulated within her room with modA via bilateral hand-held assist to the bathroom with +2 assist for line management. She required a heavy modA for safety approach to toilet due to impulsive sitting, motor apraxia with steps, and not being squared over toilet. She  was continent of bladder and was able to perform pericare with setupA. Sit<>stand with min/modA and ambulated to the bedroom sink with similar technique as above and she performed hand hygiene with a heavy minA for standing balance due to posterior lean. Transported in w/c to main therapy hallway. Gait training x273ft with min/modA via face-to-face bilateral hand held assist with +2 assist for line management and w/c follow for safety. Gait deficits include forward flexed trunk, scissoring of gait, L knee hyperextension in stance, increased lateral sway. With distractions in hallway (such as other therapists saying hi or waving) pt requires maxA for recovery of LOB's with no to delayed righting responses. Cues for correcting these deficits but patient with limited receptiveness to feedback. Attempted to perform dynamic standing balance with dual-cog overlay for card matching but patient unable to have adequate focused and sustained attention to complete, as she would rather fiddle with the card/oxygen line or other various small objects near her. She was transported back to her room and per her request, remained seated upright in w/c. Pelvic restraint and safety belt alarm attached, reconnected to 5L wall oxygen. Needs within reach.  2nd session: Pt received sitting upright in manual w/c, awake and agreeable to therapy. Connected to 5L wall oxygen via trach mask. Pt with heavy copious secretions, provided external suctioning for relief (had to do this x3 times in 23 minutes during session). Noted her shirt to be covered with these secretions as well. She was able to doff shirt with minA for motor apraxia. Provided her with scrub t-shirt where she donned with  minA as well. After completing this, she was attempted to vocalize a sentence to the therapist but unable to identify what she was trying to say. Gave her the whiteboard to have her write out her wants/needs/thoughts but all she wrote was "[facility]." With  encouragement, she then began to write "Last night ......" Words were very intelligible. Also gave her word chart to see if that would help but she shook her head. Offered to assist her back to bed but she requested to remain seated in w/c. Ended session with pelvic restraint and safety belt alarm on, needs within reach.    Therapy Documentation Precautions:  Precautions Precautions: Fall Precaution Comments: trach 28%/5L; pelvic restraint in bed, PMSV with staff, very low voice quality Required Braces or Orthoses: Other Brace Other Brace: abdominal binder - for PEG Restrictions Weight Bearing Restrictions: No  Therapy/Group: Individual Therapy  Billye Nydam P Mumin Denomme PT 05/05/2020, 7:30 AM

## 2020-05-05 NOTE — Progress Notes (Addendum)
Patient ID: Stacy Moran, female   DOB: 12-06-1955, 65 y.o.   MRN: [id number]  FMLA paperwork completed and faxed to [employer]. Have left message with daughter regarding completed and faxed. Will get originals back to daughter.  12:00 Spoke with daughter via telephone and informed of FMLA papers and she wanted worker to leave in dresser drawer in [place] room, she will be in today to visit later.

## 2020-05-05 NOTE — Progress Notes (Signed)
Occupational Therapy Session Note  Patient Details  Name: Stacy Moran MRN: [id number] Date of Birth: 1955/06/23  Today's Date: 05/05/2020 OT Individual Time: 1045-1130 OT Individual Time Calculation (min): 45 min    Short Term Goals: Week 1:  OT Short Term Goal 1 (Week 1): Pt will sit EOB with no LOB during 1 ADL task OT Short Term Goal 1 - Progress (Week 1): Met OT Short Term Goal 2 (Week 1): Pt will complete bathroom transfer with min HHA OT Short Term Goal 2 - Progress (Week 1): Progressing toward goal OT Short Term Goal 3 (Week 1): Pt will complete LB dressing with min A OT Short Term Goal 3 - Progress (Week 1): Progressing toward goal OT Short Term Goal 4 (Week 1): Pt will require no more than min cueing for focused attention to ADL task OT Short Term Goal 4 - Progress (Week 1): Progressing toward goal Week 2:  OT Short Term Goal 1 (Week 2): Pt will complete bathroom transfer with min HHA OT Short Term Goal 2 (Week 2): Pt will complete LB dressing with min A OT Short Term Goal 3 (Week 2): Pt will require no more than min cueing for focused attention to ADL task OT Short Term Goal 4 (Week 2): Pt will complete sinkside bathing with min assist and mod cueing.      Skilled Therapeutic Interventions/Progress Updates:    Pt received in wc and agreeable to going to therapy.  Pt placed on portable O2 tank and taken to quiet ortho gym.  Pt only patient in gym to reduce distraction.   The goal of this session was to focus on balance, coordination and attention.  Had pt transfer to mat, but she needed mod A with stand pivot as she was leaning to the left and flexing at the trunk.  Once pt sitting on mat, she sat upright with no leaning.   Multiple attempts at a simple 1 step task, such as "hold the ball with B hands" or "pick up the laundry basket".  Pt not able to attend for more than a few seconds and not able to attend long enough to even keep her hands on the ball or handles of the  basket. After multiple attempts, had pt stand and was actually able to have her focus for 10 seconds to hold the ball and twist side to side.  Unable to perform gait today.  Stand pivot back to wc with mod A.   Returned to room and applied [employer] valve for attempted speech. Pt mouthing words but unable to produce any audible volume.  Removed valve and gave pt the white board to practice writing.  Asked her to write out where she is from, she wrote [place].  When asked if she was born and raised here, she shook her head no but unable to write where she was from.  Wrote out todays date and asked her to copy it but she was too distracted and unable to do so. Pt resting in wc with belt alarm on, posey belt on.  Call light in reach.  Nursing aware that she did not have a pulse ox sensor on and they will get one.    Therapy Documentation Precautions:  Precautions Precautions: Fall Precaution Comments: trach 28%/5L; pelvic restraint in bed, PMSV with staff, very low voice quality Required Braces or Orthoses: Other Brace Other Brace: abdominal binder - for PEG Restrictions Weight Bearing Restrictions: No    Vital Signs: Therapy Vitals  Pulse Rate: 90 Resp: 18 Patient Position (if appropriate): Lying Oxygen Therapy SpO2: 100 % O2 Device: Tracheostomy Collar O2 Flow Rate (L/min): 5 L/min FiO2 (%): 28 % Pain: Pain Assessment Pain Scale: Faces Pain Score: 0-No pain ADL: ADL Eating: NPO Grooming: Minimal assistance Where Assessed-Grooming: Edge of bed Upper Body Bathing: Minimal assistance,Moderate cueing Where Assessed-Upper Body Bathing: Edge of bed Lower Body Bathing: Minimal assistance,Moderate cueing Where Assessed-Lower Body Bathing: Edge of bed Upper Body Dressing: Moderate cueing,Minimal assistance Where Assessed-Upper Body Dressing: Edge of bed Lower Body Dressing: Moderate cueing,Moderate assistance Where Assessed-Lower Body Dressing: Edge of bed Toileting: Moderate  assistance,Moderate cueing Where Assessed-Toileting: Glass blower/designer: Moderate assistance,Moderate verbal cueing Toilet Transfer Method: Counselling psychologist: Grab bars,Bedside commode   Therapy/Group: Individual Therapy  [place] 05/05/2020, 9:05 AM

## 2020-05-05 NOTE — Progress Notes (Signed)
[facility] [place] PHYSICAL MEDICINE & REHABILITATION PROGRESS NOTE  Subjective/Complaints:  Per note, pt has significant agitation overnight- was pulling at lines and trach, and pulling of bedclothes.  PRN ativan given- appeared to help- no more notes.   Also, pt asking via head nods to be suctioned this AM.  Is NPO still.     ROS: limited due to aphasia/language  Objective: Vital Signs: Blood pressure 128/81, pulse 90, temperature 97.6 F (36.4 C), temperature source Oral, resp. rate 18, height 5\' 9"  (1.753 m), weight 59.3 kg, SpO2 100 %. No results found. Recent Labs    05/05/20 0606  WBC 4.4  HGB 11.9*  HCT 39.0  PLT 149*   Recent Labs    05/05/20 0606  NA 141  K 4.2  CL 100  CO2 34*  GLUCOSE 97  BUN 22  CREATININE 0.70  CALCIUM 9.9   No intake or output data in the 24 hours ending 05/05/20 8250      Physical Exam: BP 128/81 (BP Location: Right Arm)    Pulse 90    Temp 97.6 F (36.4 C) (Oral)    Resp 18    Ht 5\' 9"  (1.753 m)    Wt 59.3 kg    SpO2 100%    BMI 19.31 kg/m  Constitutional: laying in bed at 30 degrees- sounds audibly junky, nodding yes and no, hands in mittens, NAD HENT: Atraumatic. Scalp with VPS Neck: + #6 cuffless trach with trach collar- no change, but seeping secretions from trach- no PMV in place Eyes: EOMI. No discharge. Cardiovascular: RRR- no JVD Respiratory: extremely junky- don't know when suctioned last, but told nurse needs it- diffuse rhonchi and had a little rattle due to secretions NL:ZJQB, NT, ND, (+)BS  + PEG. Skin: Warm and dry.  Intact. Psych: flat- got a little agitated until suctioned her mouth Musc: No edema in extremities.  No tenderness in extremities. Neuro: Alert Motor: 4+-5/5 throughout, stable  Assessment/Plan: 1. Functional deficits which require 3+ hours per day of interdisciplinary therapy in a comprehensive inpatient rehab setting.  Physiatrist is providing close team supervision and 24 hour management of active  medical problems listed below.  Physiatrist and rehab team continue to assess barriers to discharge/monitor patient progress toward functional and medical goals   Care Tool:  Bathing    Body parts bathed by patient: Right arm,Left arm,Chest,Abdomen,Right upper leg,Left upper leg,Right lower leg,Left lower leg,Face,Front perineal area,Buttocks   Body parts bathed by helper: Front perineal area,Buttocks     Bathing assist Assist Level: Minimal Assistance - Patient > 75%     Upper Body Dressing/Undressing Upper body dressing   What is the patient wearing?: Pull over shirt    Upper body assist Assist Level: Minimal Assistance - Patient > 75%    Lower Body Dressing/Undressing Lower body dressing      What is the patient wearing?: Incontinence brief,Pants     Lower body assist Assist for lower body dressing: Moderate Assistance - Patient 50 - 74%     Toileting Toileting    Toileting assist Assist for toileting: Maximal Assistance - Patient 25 - 49%     Transfers Chair/bed transfer  Transfers assist     Chair/bed transfer assist level: Minimal Assistance - Patient > 75%     Locomotion Ambulation   Ambulation assist      Assist level: 2 helpers (mod A of 1 and +2 line management) Assistive device: Walker-rolling Max distance: 172ft   Walk 10 feet activity  Assist  Walk 10 feet activity did not occur: Refused  Assist level: 2 helpers (mod A of 1 and +2 line management) Assistive device: Walker-rolling   Walk 50 feet activity   Assist Walk 50 feet with 2 turns activity did not occur: Refused  Assist level: 2 helpers (mod A of 1 and +2 line management) Assistive device: Walker-rolling    Walk 150 feet activity   Assist Walk 150 feet activity did not occur: Refused         Walk 10 feet on uneven surface  activity   Assist Walk 10 feet on uneven surfaces activity did not occur: Refused         Wheelchair     Assist Will patient  use wheelchair at discharge?: Yes Type of Wheelchair: Manual Wheelchair activity did not occur: Refused         Wheelchair 50 feet with 2 turns activity    Assist            Wheelchair 150 feet activity     Assist           Medical Problem List and Plan: 1. 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  Continue CIR 2.  Antithrombotics: -DVT/anticoagulation: Subcutaneous heparin  CBC ordered for tomorrow             -antiplatelet therapy: N/A 3. Pain Management: Tylenol as needed 4. Mood: Provide emotional support             -antipsychotic agents: Seroquel 25 mg twice daily   Agitation improved  2/7- got agitated last night per nursing- gave prn ativan- con't regimen- wasn't agitated this AM- just restless             -pt is fall risk 5. Neuropsych: This patient is not capable of making decisions on her own behalf.  Ritalin twice daily started on 2/2 with improvement 6. Skin/Wound Care: Routine skin checks 7. Fluids/Electrolytes/Nutrition: Routine in and outs  BMP within acceptable range on 1/31, labs ordered for tomorrow 8. Seizure prophylaxis.   Keppra decreased to 500 BID on 1/28, decreased further on 2/2, plan to DC this week 9. 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 Constance Holster.   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 Earleen Newport.        Dietary and speech therapy follow-up  Advanced diet as tolerated 11. Hyperglycemia related to tube feeds. SSI   CBG (last 3)  Recent Labs    05/04/20 2354 05/05/20 0415 05/05/20 0751  GLUCAP 105* 89 127*   2/7- BGs 89-127- con't regimen with SSI 12. Hypothyroidism: Syndthroid 15. Hypertension. Hydralazine 50 mg every 8 hours, Normodyne 300 mg 3 times daily, Norvasc 10 mg daily   Vitals:   05/05/20 0418 05/05/20 0727  BP: 128/81   Pulse: 80 90  Resp: 20 18  Temp: 97.6 F (36.4 C)   SpO2: 100% 100%   2/7= BP controlled 120s/80s- con't regimen  Monitor with increased mobility  LOS:  13 days A FACE TO FACE EVALUATION WAS PERFORMED  Stacy Moran 05/05/2020, 9:27 AM

## 2020-05-05 NOTE — Progress Notes (Signed)
Physical Therapy Session Note  Patient Details  Name: Stacy Moran MRN: [id number] Date of Birth: June 07, 1955  Today's Date: 05/05/2020 PT Individual Time: 3244-0102 PT Individual Time Calculation (min): 42 min   Short Term Goals: Week 2:  PT Short Term Goal 1 (Week 2): Pt will consistently complete bed mobility with CGA PT Short Term Goal 2 (Week 2): Pt will consistently perform bed<>chair transfers with minA and LRAD PT Short Term Goal 3 (Week 2): Pt will ambulate 145ft with minA and LRAD PT Short Term Goal 4 (Week 2): Pt will initiate stair training PT Short Term Goal 5 (Week 2): Pt will demonstrate improved safety awareness and decreased impuslvity with functional mobility tasks  Skilled Therapeutic Interventions/Progress Updates: Pt presents sitting in w/c and appears agreeable to therapy.  Pt changed to portable O2 on 6 LPM via trach and pelvic belt removed.  Pt very distracted and reaching for O2 and feeding tube, redirection required.  Pt wheeled to hallway and w/ assist of RT, able to amb using HHA and hand rail up to 65'.  Pt sat in arm chair and performed 3 more trials w/ hand rail only w/ frequent re-direction to task.  Attempted to perform sidestepping at rail, but only performs x 5-6 steps before beginning normal gait w/ unilateral UE on rail.  Pt amb w/ R UE on hand rail and then able to turn, crossing hallway w/o UE support but increased shuffling gait.  Pt reaches for external support during gait as well as continuing to reach and hold her tube and O2 line.  Pt returned to room and pelvic belt applied as well as seat alarm.  All needs in reach and NT present in room for vitals.  Requested suction of pt d/t increased secretions and coughing at conclusion of therapy session.      Therapy Documentation Precautions:  Precautions Precautions: Fall Precaution Comments: trach 28%/5L; pelvic restraint in bed, PMSV with staff, very low voice quality Required Braces or Orthoses: Other  Brace Other Brace: abdominal binder - for PEG Restrictions Weight Bearing Restrictions: No General:   Vital Signs: Therapy Vitals Temp: 97.6 F (36.4 C) Temp Source: Oral Pulse Rate: 75 Resp: 14 BP: 120/79 Patient Position (if appropriate): Sitting Oxygen Therapy SpO2: 100 % O2 Device: Nasal Cannula O2 Flow Rate (L/min): 5 L/min FiO2 (%): 28 % Pain:no c/o pain Pain Assessment Pain Score: 0-No pain    Therapy/Group: Individual Therapy  Ladoris Gene 05/05/2020, 2:16 PM

## 2020-05-05 NOTE — Progress Notes (Signed)
Pt is very agitated, pulling at her trach and peg, undressing her self, and throwing linen in the floor. RT at be side to deep suction the pt. PRN ativan given to help calm the pt.

## 2020-05-06 DIAGNOSIS — D62 Acute posthemorrhagic anemia: Secondary | ICD-10-CM

## 2020-05-06 LAB — GLUCOSE, CAPILLARY
Glucose-Capillary: 106 mg/dL — ABNORMAL HIGH (ref 70–99)
Glucose-Capillary: 112 mg/dL — ABNORMAL HIGH (ref 70–99)
Glucose-Capillary: 134 mg/dL — ABNORMAL HIGH (ref 70–99)
Glucose-Capillary: 72 mg/dL (ref 70–99)
Glucose-Capillary: 92 mg/dL (ref 70–99)
Glucose-Capillary: 97 mg/dL (ref 70–99)

## 2020-05-06 NOTE — Progress Notes (Signed)
[place] PHYSICAL MEDICINE & REHABILITATION PROGRESS NOTE  Subjective/Complaints: Patient seen sitting up in bed this morning.  She indicates she slept well overnight.  She still sleepy this morning.  No reported issues overnight.  ROS: limited due to cognition  Objective: Vital Signs: Blood pressure 133/83, pulse 73, temperature 98.1 F (36.7 C), temperature source Oral, resp. rate 18, height 5\' 9"  (1.753 m), weight 59.3 kg, SpO2 100 %. No results found. Recent Labs    05/05/20 0606  WBC 4.4  HGB 11.9*  HCT 39.0  PLT 149*   Recent Labs    05/05/20 0606  NA 141  K 4.2  CL 100  CO2 34*  GLUCOSE 97  BUN 22  CREATININE 0.70  CALCIUM 9.9    Intake/Output Summary (Last 24 hours) at 05/06/2020 0817 Last data filed at 05/05/2020 1812 Gross per 24 hour  Intake 0 ml  Output -  Net 0 ml        Physical Exam: BP 133/83 (BP Location: Right Arm)   Pulse 73   Temp 98.1 F (36.7 C) (Oral)   Resp 18   Ht 5\' 9"  (1.753 m)   Wt 59.3 kg   SpO2 100%   BMI 19.31 kg/m  Constitutional: No distress . Vital signs reviewed. HENT: Normocephalic.   Scalp with VPS Neck: + #6 cuffless with trach collar Eyes: EOMI. No discharge. Cardiovascular: No JVD.  RRR. Respiratory: Normal effort.  No stridor.  Upper airway sounds. GI: Non-distended.  BS +.  + PEG. Skin: Warm and dry.  Intact. Psych: Flat.  Normal behavior. Musc: No edema in extremities.  No tenderness in extremities. Neuro: Alert Motor: 4+-5/5 throughout, unchanged  Assessment/Plan: 1. Functional deficits which require 3+ hours per day of interdisciplinary therapy in a comprehensive inpatient rehab setting.  Physiatrist is providing close team supervision and 24 hour management of active medical problems listed below.  Physiatrist and rehab team continue to assess barriers to discharge/monitor patient progress toward functional and medical goals   Care Tool:  Bathing    Body parts bathed by patient: Right arm,Left  arm,Chest,Abdomen,Right upper leg,Left upper leg,Right lower leg,Left lower leg,Face,Front perineal area,Buttocks   Body parts bathed by helper: Front perineal area,Buttocks     Bathing assist Assist Level: Minimal Assistance - Patient > 75%     Upper Body Dressing/Undressing Upper body dressing   What is the patient wearing?: Pull over shirt    Upper body assist Assist Level: Minimal Assistance - Patient > 75%    Lower Body Dressing/Undressing Lower body dressing      What is the patient wearing?: Incontinence brief,Pants     Lower body assist Assist for lower body dressing: Moderate Assistance - Patient 50 - 74%     Toileting Toileting    Toileting assist Assist for toileting: Maximal Assistance - Patient 25 - 49%     Transfers Chair/bed transfer  Transfers assist     Chair/bed transfer assist level: Minimal Assistance - Patient > 75%     Locomotion Ambulation   Ambulation assist      Assist level: 2 helpers Assistive device: Other (comment) (HHA and hand rail) Max distance: 65   Walk 10 feet activity   Assist  Walk 10 feet activity did not occur: Refused  Assist level: 2 helpers Assistive device: Other (comment) (HHA and hand rail.)   Walk 50 feet activity   Assist Walk 50 feet with 2 turns activity did not occur: Refused  Assist level: 2 helpers  Assistive device: Other (comment) (HHA and hand rail.)    Walk 150 feet activity   Assist Walk 150 feet activity did not occur: Refused         Walk 10 feet on uneven surface  activity   Assist Walk 10 feet on uneven surfaces activity did not occur: Refused         Wheelchair     Assist Will patient use wheelchair at discharge?: Yes Type of Wheelchair: Manual Wheelchair activity did not occur: Refused         Wheelchair 50 feet with 2 turns activity    Assist            Wheelchair 150 feet activity     Assist           Medical Problem List and  Plan: 1. 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  Continue CIR 2.  Antithrombotics: -DVT/anticoagulation: Subcutaneous heparin             -antiplatelet therapy: N/A 3. Pain Management: Tylenol as needed 4. Mood: Provide emotional support             -antipsychotic agents: Seroquel 25 mg twice daily   Agitation improved             -pt is fall risk 5. Neuropsych: This patient is not capable of making decisions on her own behalf.  Ritalin twice daily started on 2/2 with improvement 6. Skin/Wound Care: Routine skin checks 7. Fluids/Electrolytes/Nutrition: Routine in and outs  BMP within acceptable range on 2/7 8. Seizure prophylaxis.   Keppra decreased to 500 BID on 1/28, decreased further on 2/2, DC'd on 2/7 9. 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 Constance Holster.   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 Earleen Newport.       Dietary and speech therapy follow-up  Advanced diet as tolerated 11. Hyperglycemia related to tube feeds. SSI   CBG (last 3)  Recent Labs    05/05/20 2009 05/06/20 0008 05/06/20 0810  GLUCAP 116* 106* 112*   Mildly elevated on 2/8 12. Hypothyroidism: Syndthroid 15. Hypertension. Hydralazine 50 mg every 8 hours, Normodyne 300 mg 3 times daily, Norvasc 10 mg daily   Vitals:   05/06/20 0412 05/06/20 0759  BP: 133/83   Pulse: 73 73  Resp: 18 18  Temp: 98.1 F (36.7 C)   SpO2: 100% 100%   Controlled on 2/8  Monitor with increased mobility 13.  Acute blood loss anemia  Hemoglobin 11.9 on  2/7  Continue to monitor  LOS: 14 days A FACE TO FACE EVALUATION WAS PERFORMED  [place] Lorie Phenix 05/06/2020, 8:17 AM

## 2020-05-06 NOTE — Progress Notes (Signed)
Physical Therapy Session Note  Patient Details  Name: Stacy Moran MRN: [id number] Date of Birth: 1955-05-12  Today's Date: 05/06/2020 PT Individual Time: 1130-1155 + 1430-1526 PT Individual Time Calculation (min): 25 min  + 56 min  Short Term Goals: Week 2:  PT Short Term Goal 1 (Week 2): Pt will consistently complete bed mobility with CGA PT Short Term Goal 2 (Week 2): Pt will consistently perform bed<>chair transfers with minA and LRAD PT Short Term Goal 3 (Week 2): Pt will ambulate 131ft with minA and LRAD PT Short Term Goal 4 (Week 2): Pt will initiate stair training PT Short Term Goal 5 (Week 2): Pt will demonstrate improved safety awareness and decreased impuslvity with functional mobility tasks  Skilled Therapeutic Interventions/Progress Updates:     1st session: Pt received supine in bed, sleeping on arrival but awakens to voice. Shakes head 'no' to pain. Pt with PEG feedings running during session and on 5L wall O2 via trach mask and connected to 4L portable tank throughout session. Removed pelvic restraint. Pt attempting to communicate with therapist but unable to read her lips. Provided her with whiteboard and marker but intelligible words written. Supine<>sit with tactile cueing for initiation. Stand<>pivot with minA from EOB to w/c with cues for safety approach and technique. W/c transport to main therapy gym. Performed dynamic standing ball toss with therapist providing min/modA guard and rehab tech tossing ball. Ball toss to L hemibody and R hemibody, focusing on reaction times ,BUE coordination, and righting reactions. Pt with several LOB's while completing this, requiring min/modA for standing balance. She also performed overhead toss with ball, working on overhead reaching and opening up tight anterior musculature. Pt returned to her room and remained seated in w/c at end of session with pelvic restraint and safety belt alarm on, connected to 5L wall oxygen, needs within reach.    2nd session: Pt received sitting upright in w/c, agreeable to therapy. No reports of pain. Due to several incontinent episodes during therapies, encouraged her to use bathroom prior to beginning functional gait training. Pt shaking her head no and it took some time to encourage her to just try. Also spent time educating her on timed toileting to retrain bladder. Also spoke with her RN to begin scheduling timed toileting with nursing staff. Removed pelvic restraint and safety belt, connected her to 4L O2 via portable tank and trach mask. Ambulated with min/modA via hand held assist to the bathroom and required mod/max cues for safety approach to toilet. Pt able to manage lower body dressing with modA for standing balance and miNA for controlled lowering to the regular toilet height. As soon as she sat down, she was continent of bladder. Ambulated with min/modA via hand held assist to bedroom sink and she washed her hands with minA for standing balance but requiring modA cueing for sequencing and motor planning. Wheeled her outside to main therapy hallway to focus remainder of session on functional gait training. Placed x6 bright orange cones along the length of the hallway on both hand rails. Instructed her to locate all of them, retrieve them, and give them to rehab tech. She nodded head 'yes' in understanding. Ambulated length of hallway (~18ft) twice with minA via bilateral hand held assist, she missed all the cones that were placed on her L side and required min cues for attending to cones on R side. Even after educating her on visual scanning and focusing on attending to task, she continued to have difficulty completing this. Therapist  then placed x6 cones along length of hallway on the floor and instructed her on retrieving them by picking them up from floor while ambulating. She required modA for balance while bending over to pick up cones and again struggled with attending to cones that were placed on L  side of the floor. She returned to her room and requested to remain seated upright in w/c. Pelvic restraint on, safety belt alarm on, connected to 5L wall O2, provided external suction due to secretions. Ended session with needs in reach.  Therapy Documentation Precautions:  Precautions Precautions: Fall Precaution Comments: trach 28%/5L; pelvic restraint in bed, PMSV with staff, very low voice quality Required Braces or Orthoses: Other Brace Other Brace: abdominal binder - for PEG Restrictions Weight Bearing Restrictions: No  Therapy/Group: Individual Therapy  Brenda Cowher P [place] Lohr PT 05/06/2020, 7:37 AM

## 2020-05-06 NOTE — Progress Notes (Signed)
Pt rested well through out the night, having less agitation, and denies pain. Pt had no incontinent episodes. Pt resting in bed at this time.

## 2020-05-06 NOTE — Progress Notes (Signed)
Pt asleep at time of scheduled ATC check. No obvious respiratory distress noted. RT will continue to monitor.

## 2020-05-06 NOTE — Progress Notes (Signed)
Occupational Therapy Session Note  Patient Details  Name: Stacy Moran MRN: [id number] Date of Birth: 10-03-1955  Today's Date: 05/06/2020 OT Individual Time: 4268-3419 OT Individual Time Calculation (min): 60 min    Short Term Goals: Week 2:  OT Short Term Goal 1 (Week 2): Pt will complete bathroom transfer with min HHA OT Short Term Goal 2 (Week 2): Pt will complete LB dressing with min A OT Short Term Goal 3 (Week 2): Pt will require no more than min cueing for focused attention to ADL task OT Short Term Goal 4 (Week 2): Pt will complete sinkside bathing with min assist and mod cueing.  Skilled Therapeutic Interventions/Progress Updates:    Pt received in bed, sleeping but did wake up easily. (+2 A from rehab tech this session) Pt kept pulling covers over her body.  Therapist removed blankets and pillows and guided pt to sit up.   Pt sat with S and then transferred to wc with min A. Switched to portable tank. Explained to pt that to start session that she should try to toilet.  Pt wheeled into bathroom and placed in front of toilet with BSC over toilet.    Asked pt several times to stand up to sit on toilet.  She continually shook her head no. Explained to pt that even if she did not need to go, she should practice the transfer and try to work on increasing her continence.  Explained that we use a 2 hour toileting schedule with patients who have incontinence to help them achieve continence. Tech also trying to motivate her.  Pt reached forward and shut lid of toilet seat, therapist opened it and pt continued to close the lid for a cycle of 5x, shaking her head no and smiling.  Tech would encourage pt and she would look at him and smile, shaking head no.  Pt then pulled her brief off and we then said, 'well lets sit on the toilet since you pulled it off".  Pt stood suddenly and urinated in standing.  A great deal of urine came out all over her, the floor, the wheelchair was soaked in  urine.  Pt transferred to shower.  Had to quickly cover her IV in plastic, tape g tube to her torso as she was trying to pull on it with binder removed.  Kept water below neck line to avoid trach.  In shower, pt demonstrated much improved praxis and initiation and bathed self with S and then CGA when she performed lateral leans.  Min A back to NEW w/c that tech got for her.  (old chair soaked so placed in her shower to be sprayed down).  In room, pt donned shirt without cues,  Min A to start pants over feet and CGA to stand as she pulled over hips.  Mod A with socks.  Pt transferred back to bed. Abdominal binder on, posey belt on, bed alarm on.  I do suspect behavioral issues as she was very adamant about not trying to sit on the toilet and then just stood to urinate.  In her refusal to sit on toilet, she was smiling.  Explained to pt that she has to put in her full effort, and going home with family will be extremely challenging if she is very incontinent and unwilling to work on a toileting schedule.  Pt is able to do quite a bit when she attends and puts effort in.   Therapy Documentation Precautions:  Precautions Precautions: Fall Precaution Comments:  trach 28%/5L; pelvic restraint in bed, PMSV with staff, very low voice quality Required Braces or Orthoses: Other Brace Other Brace: abdominal binder - for PEG Restrictions Weight Bearing Restrictions: No    Vital Signs: Therapy Vitals Pulse Rate: 67 Resp: 18 Patient Position (if appropriate): Lying Oxygen Therapy SpO2: 100 % O2 Device: Tracheostomy Collar O2 Flow Rate (L/min): 5 L/min FiO2 (%): 28 % Pain: Pain Assessment Pain Score: 0-No pain ADL: ADL Eating: NPO Grooming: Minimal assistance Where Assessed-Grooming: Edge of bed Upper Body Bathing: Supervision/safety Where Assessed-Upper Body Bathing: Shower Lower Body Bathing: Contact guard Where Assessed-Lower Body Bathing: Shower Upper Body Dressing: Setup Where  Assessed-Upper Body Dressing: Wheelchair Lower Body Dressing: Moderate cueing Where Assessed-Lower Body Dressing: Wheelchair Toileting:  (refused) Where Assessed-Toileting: Glass blower/designer:  (refused) Armed forces technical officer Method: Counselling psychologist: Event organiser: Environmental education officer Method: Radiographer, therapeutic: Grab bars,Transfer tub bench   Therapy/Group: Individual Therapy  [place] 05/06/2020, 1:22 PM

## 2020-05-07 LAB — GLUCOSE, CAPILLARY
Glucose-Capillary: 102 mg/dL — ABNORMAL HIGH (ref 70–99)
Glucose-Capillary: 103 mg/dL — ABNORMAL HIGH (ref 70–99)
Glucose-Capillary: 107 mg/dL — ABNORMAL HIGH (ref 70–99)
Glucose-Capillary: 113 mg/dL — ABNORMAL HIGH (ref 70–99)
Glucose-Capillary: 120 mg/dL — ABNORMAL HIGH (ref 70–99)
Glucose-Capillary: 96 mg/dL (ref 70–99)

## 2020-05-07 MED ORDER — GLYCOPYRROLATE 1 MG PO TABS
1.0000 mg | ORAL_TABLET | Freq: Two times a day (BID) | ORAL | Status: DC
  Administered 2020-05-07 – 2020-05-15 (×17): 1 mg via ORAL
  Filled 2020-05-07 (×19): qty 1

## 2020-05-07 MED ORDER — METHYLPHENIDATE HCL 5 MG PO TABS
5.0000 mg | ORAL_TABLET | Freq: Two times a day (BID) | ORAL | Status: DC
  Administered 2020-05-07 – 2020-05-12 (×11): 5 mg via ORAL
  Filled 2020-05-07 (×11): qty 1

## 2020-05-07 NOTE — Progress Notes (Addendum)
[place] PHYSICAL MEDICINE & REHABILITATION PROGRESS NOTE  Subjective/Complaints: Patient seen sitting up in bed this morning.  No reported issues overnight.  She is sleepy this morning.  ROS: limited due to cognition  Objective: Vital Signs: Blood pressure 122/78, pulse 81, temperature 98.5 F (36.9 C), resp. rate 20, height 5\' 9"  (1.753 m), weight 59.3 kg, SpO2 96 %. No results found. Recent Labs    05/05/20 0606  WBC 4.4  HGB 11.9*  HCT 39.0  PLT 149*   Recent Labs    05/05/20 0606  NA 141  K 4.2  CL 100  CO2 34*  GLUCOSE 97  BUN 22  CREATININE 0.70  CALCIUM 9.9   No intake or output data in the 24 hours ending 05/07/20 0914      Physical Exam: BP 122/78   Pulse 81   Temp 98.5 F (36.9 C)   Resp 20   Ht 5\' 9"  (1.753 m)   Wt 59.3 kg   SpO2 96%   BMI 19.31 kg/m  Constitutional: No distress . Vital signs reviewed. HENT: Normocephalic.  Atraumatic. Scalp with VPS Neck: + #6 cuffless with trach collar with secretions Eyes: EOMI. No discharge. Cardiovascular: No JVD.  RRR. Respiratory: Normal effort.  No stridor.  Upper airway sounds. GI: Non-distended.  BS +.  + PEG. Skin: Warm and dry.  Intact. Psych: Flat.  Normal behavior. Musc: No edema in extremities.  No tenderness in extremities. Neuro: Alert Motor: 4+-5/5 throughout, stable  Assessment/Plan: 1. Functional deficits which require 3+ hours per day of interdisciplinary therapy in a comprehensive inpatient rehab setting.  Physiatrist is providing close team supervision and 24 hour management of active medical problems listed below.  Physiatrist and rehab team continue to assess barriers to discharge/monitor patient progress toward functional and medical goals   Care Tool:  Bathing    Body parts bathed by patient: Right arm,Left arm,Chest,Abdomen,Right upper leg,Left upper leg,Right lower leg,Left lower leg,Face,Front perineal area,Buttocks   Body parts bathed by helper: Front perineal  area,Buttocks     Bathing assist Assist Level: Contact Guard/Touching assist     Upper Body Dressing/Undressing Upper body dressing   What is the patient wearing?: Pull over shirt    Upper body assist Assist Level: Set up assist    Lower Body Dressing/Undressing Lower body dressing      What is the patient wearing?: Incontinence brief,Pants     Lower body assist Assist for lower body dressing: Moderate Assistance - Patient 50 - 74%     Toileting Toileting Toileting Activity did not occur Landscape architect and hygiene only): Refused  Toileting assist Assist for toileting: Maximal Assistance - Patient 25 - 49%     Transfers Chair/bed transfer  Transfers assist     Chair/bed transfer assist level: Minimal Assistance - Patient > 75%     Locomotion Ambulation   Ambulation assist      Assist level: 2 helpers Assistive device: Other (comment) (HHA and hand rail) Max distance: 65   Walk 10 feet activity   Assist  Walk 10 feet activity did not occur: Refused  Assist level: 2 helpers Assistive device: Other (comment) (HHA and hand rail.)   Walk 50 feet activity   Assist Walk 50 feet with 2 turns activity did not occur: Refused  Assist level: 2 helpers Assistive device: Other (comment) (HHA and hand rail.)    Walk 150 feet activity   Assist Walk 150 feet activity did not occur: Refused  Walk 10 feet on uneven surface  activity   Assist Walk 10 feet on uneven surfaces activity did not occur: Refused         Wheelchair     Assist Will patient use wheelchair at discharge?: Yes Type of Wheelchair: Manual Wheelchair activity did not occur: Refused         Wheelchair 50 feet with 2 turns activity    Assist            Wheelchair 150 feet activity     Assist           Medical Problem List and Plan: 1. 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  Continue CIR  Team conference today to discuss current and goals and coordination of care, home and environmental barriers, and discharge planning with nursing, case manager, and therapies. Please see conference note from today as well.  2.  Antithrombotics: -DVT/anticoagulation: Subcutaneous heparin             -antiplatelet therapy: N/A 3. Pain Management: Tylenol as needed 4. Mood: Provide emotional support             -antipsychotic agents: Seroquel 25 mg twice daily   Agitation improved             -pt is fall risk 5. Neuropsych: This patient is not capable of making decisions on her own behalf.  Ritalin twice daily started on 2/2 with improvement, increased on 2/9 6. Skin/Wound Care: Routine skin checks 7. Fluids/Electrolytes/Nutrition: Routine in and outs  BMP within acceptable range on 2/7 8. Seizure prophylaxis.   Keppra decreased to 500 BID on 1/28, decreased further on 2/2, DC'd on 2/7 9. 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 Constance Holster.   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 Earleen Newport.       Dietary and speech therapy follow-up  Advanced diet as tolerated 11. Hyperglycemia related to tube feeds. SSI   CBG (last 3)  Recent Labs    05/07/20 0000 05/07/20 0404 05/07/20 0816  GLUCAP 120* 113* 102*   Relatively controlled on 2/9 12. Hypothyroidism: Syndthroid 51. Hypertension. Hydralazine 50 mg every 8 hours, Normodyne 300 mg 3 times daily, Norvasc 10 mg daily   Vitals:   05/07/20 0453 05/07/20 0907  BP: 122/78   Pulse: 79  81  Resp: 18 20  Temp:    SpO2: 95% 96%   Controlled on 2/9  Monitor with increased mobility 13.  Acute blood loss anemia  Hemoglobin 11.9 on 2/7  Continue to monitor  LOS: 15 days A FACE TO FACE EVALUATION WAS PERFORMED  Andilyn Bettcher Lorie Phenix 05/07/2020, 9:14 AM

## 2020-05-07 NOTE — Progress Notes (Signed)
Physical Therapy Weekly Progress Note  Patient Details  Name: Stacy Moran MRN: [id number] Date of Birth: 08/29/1955  Beginning of progress report period: April 30, 2020 End of progress report period: May 07, 2020  Today's Date: 05/07/2020 PT Individual Time: 0915-1008 PT Individual Time Calculation (min): 53 min   Patient has met 2 of 5 short term goals. Pt is making slow but appropriate progress towards her goals. 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 Pt is able to complete bed mobility with CGA, bed<>chair transfers with minA, gait up to 200ft with min/modA with +2 assist for chair follow for safety. She also continues to provide copious secretions from trach site however this is improving.  Patient continues to demonstrate the following deficits muscle weakness, decreased cardiorespiratoy endurance and decreased oxygen support, impaired timing and sequencing, unbalanced muscle activation, motor apraxia, decreased coordination and decreased motor planning, decreased visual acuity, decreased visual perceptual skills and decreased visual motor skills, decreased attention to left, decreased motor planning and ideational apraxia, decreased initiation, decreased attention, decreased awareness, decreased problem solving, decreased safety awareness, decreased memory and delayed processing and decreased standing balance, decreased postural control and decreased balance strategies and therefore will continue to benefit from skilled PT intervention to increase functional independence with mobility.  Patient progressing toward long term goals.  Continue plan of care.  PT Short Term Goals Week 2:  PT Short Term Goal 1 (Week 2): Pt will consistently complete bed mobility  with CGA PT Short Term Goal 2 (Week 2): Pt will consistently perform bed<>chair transfers with minA and LRAD PT Short Term Goal 3 (Week 2): Pt will ambulate 150ft with minA and LRAD PT Short Term Goal 4 (Week 2): Pt will initiate stair training PT Short Term Goal 5 (Week 2): Pt will demonstrate improved safety awareness and decreased impuslvity with functional mobility tasks  Skilled Therapeutic Interventions/Progress Updates:    Handoff of care from RN who just finished assisting patient back from the toilet, pt was supine in bed, pt agreeable to therapy. She shakes head 'no' to pain. Connected to 5L wall O2 via trach mask and on 4L portable tank during session. Only had to provide x1 instance of external suction via yaunker on therapist arrival but no secretions noted during session however also no gait training performed either. She performed supine<>sit with close supervision but mod cues for initiation. Able to sit EOB with supervision as well and provided her with scrub pants/shirt combo. She required extra time for motor planning to don pants but also had initiation deficits putting these on. She also put them on backwards on her 1st attempt and required cues for correcting. She also would attempt to put a 2nd layer of socks on despite them already being on feet and she attempted this multiple times while putting the pants on. She donned shirt with minA, primarily for pulling over her head. Stand<>pivot with minA from EOB to w/c with assist needed for safety approach, sequencing, and initiation. No +2 available during session so focused session on standing balance and attenuation to simple tasks. W/c transport to dayroom gym for time management and placed in front of high/low table at windows. Instructed her on clothes pin task using only certain colors (blue,red,green), focusing on sustained attention. Sit<>stand with minA and able to complete ~4 pins prior to distractions and abrupt sitting.   She  repeated this ~x4 times but continued to show poor attention and participation. Provided her alternative form of activity with arc but again showed limited ability to complete this due to effort and attention. Pt was returned to her room with totalA in w/c and stand<>pivot with miNA back to bed. Donned pelvic restraint and reconnected to wall O2. Ended session supine in bed with bed alarm on and needs in reach, pt falling asleep quickly when returned to bed.  Therapy Documentation Precautions:  Precautions Precautions: Fall Precaution Comments: trach 28%/5L; pelvic restraint in bed, PMSV with staff, very low voice quality Required Braces or Orthoses: Other Brace Other Brace: abdominal binder - for PEG Restrictions Weight Bearing Restrictions: No  Therapy/Group: Individual Therapy   P  PT 05/07/2020, 7:33 AM   

## 2020-05-07 NOTE — Progress Notes (Signed)
Speech Language Pathology Weekly Progress and Session Note  Patient Details  Name: Stacy Moran MRN: [id number] Date of Birth: 17-Jun-1955  Beginning of progress report period: April 30, 2020 End of progress report period: May 07, 2020  Today's Date: 05/07/2020 SLP Individual Time: 0258-5277 SLP Individual Time Calculation (min): 29 min  Short Term Goals: Week 2: SLP Short Term Goal 1 (Week 2): Pt will tolerate PMSV for 30 minutes without s/s of distress over x3 opportunites. SLP Short Term Goal 1 - Progress (Week 2): Not met SLP Short Term Goal 2 (Week 2): Pt will demonstrate sustained attention in 25 minute intervals with mod A multimodal cues in 3 sessions. SLP Short Term Goal 2 - Progress (Week 2): Not met SLP Short Term Goal 3 (Week 2): Pt will follow 1 step commands with mod A multimodal cues in 80% of opportunties. SLP Short Term Goal 3 - Progress (Week 2): Met SLP Short Term Goal 4 (Week 2): Pt will consume trials of ice chips with moderate s/s aspiration and reduced expeling of mucus secretions, prior to repeat MBS. SLP Short Term Goal 4 - Progress (Week 2): Not met SLP Short Term Goal 5 (Week 2): Pt will communicate wants/needs at written word level with mod A verbal cues in 70% of opportunties. SLP Short Term Goal 5 - Progress (Week 2): Not met SLP Short Term Goal 6 (Week 2): Pt will phonate at the phenome level x2 with max A multimodal cues. SLP Short Term Goal 6 - Progress (Week 2): Not met    New Short Term Goals: Week 3: SLP Short Term Goal 1 (Week 3): Pt will tolerate PMSV for 25 minutes without s/s of distress over x2 opportunites. SLP Short Term Goal 2 (Week 3): Pt will demonstrate sustained attention in 3 minute intervals with mod A multimodal cues. SLP Short Term Goal 3 (Week 3): Pt will initate with extra time 1 step commands/basic problem solving tasks with min A multimodal cues in 70% of opportunties. SLP Short Term Goal 4 (Week 3): Pt will consume trials of  ice chips with moderate s/s aspiration and reduced expeling of mucus secretions, prior to repeat MBS. SLP Short Term Goal 5 (Week 3): Pt will name objects at written word level with minimal spelling errors given min A verbal cues in 70% of opportunties. SLP Short Term Goal 6 (Week 3): Pt will attempt phonation in 3 out 5 opportunties with mod A multimodal cues.  Weekly Progress Updates:Pt made poor progress meeting 1 out 6 goals this reporting period, due to poor attention as well as likely a behavioral component of poor frustration tolerance/fatigue. Last reporting period pt demonstrated increase alertness, focused attention and participation. This was marked inaccurately by Probation officer as sustained attention. Pt demonstrated true sustained attention in 2 minute intervals. Pt did demonstrate increase ability to follow 1 step commands, increase awareness of deficits, x1 glottal sound/low intensity voicing and ability to write occasional phrases and object names. Pt's participation as well as opportunities were limited in thin liquid trials due to poor participation. SLP will continue to focus on PMSV tolerance, thin liquid trials for instrumental swallow assessment, expressive/recpetive language and attention. Pt continue to benefit from skilled ST services in order to maximize functional independence and reduce burden of care, requiring 24 hour supervision at discharge with continued skilled ST services.       Intensity: Minumum of 1-2 x/day, 30 to 90 minutes Frequency: 3 to 5 out of 7 days Duration/Length of Stay: 2/23 Treatment/Interventions:  Cognitive remediation/compensation;Cueing hierarchy;Dysphagia/aspiration precaution training;Functional tasks;Internal/external aids;Speech/Language facilitation;Patient/family education   Daily Session  Skilled Therapeutic Interventions: Skilled ST services focused on cognitive skills. Pt demonstrated abilities to follow familiar commands such as attemping to  sit set up in bed upon request. SLP facilitated sustained attention and initiation in connect four task, pt demonstrated sustained attention in 20 minute intervals with mod A verbal cues and appropriate basic problem solving was noted during the initial 30 seconds of action (ex: blocking SLP attempt to connect 4.) Pt required extra time to initiate 5 out 7 opportunities and required demonstration cues on 3 out 7 opportunities to demonstrate task. SLP donned PMSV in which she was able to tolerate for 15 minutes with stats remaining WFL and no s/s of distress. When pt was asked to sort pieces by two colors, pt was initially able to complete task, however demonstrated confusion and reduced awareness of errors when asked to manipulate pieces back to original place. Pt was able to spell only last name via dictation and corrected x1 spelling error with verbal cue. SLP removed PMSV. Pt was left in room with call bell within reach and bed alarm set. SLP recommends to continue skilled services.   General    Pain Pain Assessment Pain Scale: 0-10 Pain Score: 0-No pain  Therapy/Group: Individual Therapy  Desmin Daleo 05/07/2020, 1:50 PM

## 2020-05-07 NOTE — Progress Notes (Addendum)
Patient ID: Stacy Moran, female   DOB: [birth year], 65 y.o.   MRN: [id number]  Attempted to contact daughter but her voice mail is full. Will try later and text her to call this worker for team conference update.  3:40 PM Spoke with daughter regarding conference and her need for education prior to pt discharging home with her. She can do weekends and can do Sat, will begin this sat at 10-12 for training. Aware pt will be going home with a trach. Need to get pt on bolus feeds instead of continuous for peg feedings. Have asked MD if can thin secretions or downsize trach prior to discharge. Daughter wants to save her time to be able to be with her mom at discharge.

## 2020-05-07 NOTE — Progress Notes (Addendum)
Nutrition Follow-up  DOCUMENTATION CODES:   Severe malnutrition in context of acute illness/injury  INTERVENTION:   Continue tube feeds via PEG: - Osmolite 1.2 @ 75 ml/hr (tube feeding can be held for up to 4 hours for therapies) - Continue ProSource TF 45 ml BID - Continue fee water flushes of 200 ml q 6 hours  Tube feeding regimen provides1880kcal, 105grams of protein, and 1226m of H2O.  Total free water with flushes: 2030 ml   Bolus tube feeding recommendations via PEG:  - 1.5 cartons (355 ml) of Osmolite 1.2 formula QID -Continue ProSource TF 45 mL BID -Continue free water flushes of 200 mL q 6 hours   Tube feeding regimen provides 1784 kcal, 101 grams of protein, and 1164 mL of H2O.   Total free water with flushes: 1964 mL.   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  Ongoing.   GOAL:   Patient will meet greater than or equal to 90% of their needs  Met with TF.   MONITOR:   Diet advancement,Labs,Weight trends,TF tolerance,I & O's  REASON FOR ASSESSMENT:   Consult Enteral/tube feeding initiation and management  ASSESSMENT:   65year old female with unremarkable PMH. Pt presented on 01/30/20 with headache and obtundation requiring intubation for airway protection. 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 Pt underwent right frontal ventriculostomy on 02/26/20. Pt then underwent diagnostic cerebral angiogram for further delineating the left PICA aneurysm which was noted not to be amenable to endovascular treatment so pt underwent suboccipital craniotomy for clipping of PICA aneurysm 01/31/20. Pt required  tracheostomy as well as thyroidectomy on 02/08/20. Pt remains NPO with PEG in place. Admitted to  [facility]IR on 04/22/20.  Pt remains NPO and continues to work with SLP towards diet advancement.   Spoke with nurse who reports pt is tolerating tube feeding well. Pt denied any stomach pain, nausea, vomiting or diarrhea.  RD re-weighed pt and pt's weight for 02/09 was 130.9#, which indicates no weight significant changes.   Meds Reviewed: Robinul (1 mg, BID), ProSource TF (45 mL, BID), free water (200 mL, TID), Osmolite 1.2 at 734mhr for 20 hrs.   Labs Reviewed.  Diet Order:   Diet Order            Diet NPO time specified  Diet effective now                 EDUCATION NEEDS:   No education needs have been identified at this time  Skin:  Skin Assessment: Skin Integrity Issues: Skin Integrity Issues:: Incisions Incisions: abdomen  Last BM:  02/06  Height:   Ht Readings from Last 1 Encounters:  04/22/20 5' 9"  (1.753 m)    Weight:   Wt Readings from Last 1 Encounters:  04/22/20 59.3 kg    Ideal Body Weight:  68.2 kg  BMI:  Body mass index is 19.31 kg/m.  Estimated Nutritional Needs:   Kcal:  178184-0375Protein:  90-105 grams  Fluid:  1.7-1.9 L    GrSalvadore OxfordDietetic Intern 05/07/2020 4:17 PM

## 2020-05-07 NOTE — Progress Notes (Signed)
Speech Language Pathology Daily Session Note  Patient Details  Name: Stacy Moran MRN: [id number] Date of Birth: [birth year]  Today's Date: 05/07/2020 SLP Individual Time: 2633-3545 SLP Individual Time Calculation (min): 40 min  Short Term Goals: Week 2: SLP Short Term Goal 1 (Week 2): Pt will tolerate PMSV for 30 minutes without s/s of distress over x3 opportunites. SLP Short Term Goal 2 (Week 2): Pt will demonstrate sustained attention in 25 minute intervals with mod A multimodal cues in 3 sessions. SLP Short Term Goal 3 (Week 2): Pt will follow 1 step commands with mod A multimodal cues in 80% of opportunties. SLP Short Term Goal 4 (Week 2): Pt will consume trials of ice chips with moderate s/s aspiration and reduced expeling of mucus secretions, prior to repeat MBS. SLP Short Term Goal 5 (Week 2): Pt will communicate wants/needs at written word level with mod A verbal cues in 70% of opportunties. SLP Short Term Goal 6 (Week 2): Pt will phonate at the phenome level x2 with max A multimodal cues.  Skilled Therapeutic Interventions: Pt was seen for skilled ST targeting speech/voice and cognitive goals. Pt was lethargic throughout session, requiring extra time and Mod A multimodal stimulation to arouse for some participation in interventions. She completed oral care via suction brush with set up assistance and Min A verbal cueing for problem solving and thoroughness. PMSV donned for ~10 minutes, however it was expelled from trach hub X2 during that time period via coughing. After wearing the PMSV, pt began to produce very thick mucous secretions, which were expectorated via trach only. Although encouragement provided to participate in trials of ice chips, pt declined. She did attempt to voice on command (vowel sounds and "thank you"), although no true phonation was achieved. Pt use white board to communicate do some degree, however when asked if she needed anything else from therapist she only wrote  her name. Dictation cues required for her to write "hospital" when answering orientation questions. Pt left in bed with alarm set and needs within reach, abdominal restraint still applied, PMSV doffed. Continue per current plan of care.      Pain Pain Assessment Pain Scale: 0-10 Pain Score: 0-No pain  Therapy/Group: Individual Therapy  Arbutus Leas 05/07/2020, 7:22 AM

## 2020-05-07 NOTE — Progress Notes (Signed)
Occupational Therapy Session Note  Patient Details  Name: Stacy Moran MRN: [id number] Date of Birth: [birth year]  Today's Date: 05/07/2020 OT Individual Time: 1305-1400 OT Individual Time Calculation (min): 55 min    Short Term Goals: Week 2:  OT Short Term Goal 1 (Week 2): Pt will complete bathroom transfer with min HHA OT Short Term Goal 2 (Week 2): Pt will complete LB dressing with min A OT Short Term Goal 3 (Week 2): Pt will require no more than min cueing for focused attention to ADL task OT Short Term Goal 4 (Week 2): Pt will complete sinkside bathing with min assist and mod cueing.  Skilled Therapeutic Interventions/Progress Updates:    Pt asleep supine in bed, requiring max VCs and TCs to promote alertness and participation during OT session.  Pt completed supine to sit with min assist and repetitive VCs to scoot towards EOB.  Pt needed min assist for stand pivot to [facility].  Pt had continent episode of urine.  Toileting required mod assist primarily for clothing mgt and standing balance due to pt continuously attempting to sit prematurely.  Stand pivot to w/c with min assist.  Pt completed bathing with CGA and dressing tasks with supervision for UB and mod assist for LB sitting/standing at sink with max repetitive simple multimodal cueing to complete each task.  Pt very distractible, and perseverating on attempting to take wash cloth and clean sink throughout self care.  After self care, OT assessed pts orientation with encouragement for pt to answer using dry erase board (pt refused donning of PMV).  Pt unable to write legible answers to person, place, time questions and wrote "renting home" when asked what she used to do for work (per chart review, pt worked in a factory at prior level).  Call bell in reach, sitting up in w/c, pelvic restraint donned, seat belt alarm on, nurse notified of pts location at end of session.    Therapy Documentation Precautions:  Precautions Precautions:  Fall Precaution Comments: trach 28%/5L; pelvic restraint in bed, PMSV with staff, very low voice quality Required Braces or Orthoses: Other Brace Other Brace: abdominal binder - for PEG Restrictions Weight Bearing Restrictions: No   Therapy/Group: Individual Therapy  Ezekiel Slocumb 05/07/2020, 4:58 PM

## 2020-05-07 NOTE — Progress Notes (Signed)
Physical Therapy Session Note  Patient Details  Name: Stacy Moran MRN: [id number] Date of Birth: [birth year]  Today's Date: 05/07/2020 PT Individual Time:1423-1501   38 min   Short Term Goals: Week 3:  PT Short Term Goal 1 (Week 3): Pt will complete bed mobility with supervision PT Short Term Goal 2 (Week 3): Pt will consistently perform bed<>chair transfers with CGA and LRAD PT Short Term Goal 3 (Week 3): Pt will ambulate 145f with minA of 1 person and LRAD PT Short Term Goal 4 (Week 3): Pt will initiate stair training PT Short Term Goal 5 (Week 3): Pt will consistently demonstrate improved safety awareness and decreased impulsivity with functional mobility tasks  Skilled Therapeutic Interventions/Progress Updates:  Pt received sitting in WC and agreeable to PT. RN present to finish medication administration. [facility] on portable O2 with FIO2at 28% of 4L/min. Sit<>stand transfers completed x 6 throughout session with min assist and moderate cues for initiation. Gait training with HHA and +2 for O2 management x 636fand 2x 2028fith cues for turning technique and assist for proper weight shifting. nustep reciprocal cardiovasculatr endurance training x 6 mintes with cues for full ROM and improved shoulder scaption angle to protect shoulder and wrist joints throughout. Pt unable to rate RPE upon completion. No sings of SOB throughout. Patient returned to room [facility]  [facility], with call bell in reach and all needs met.          Therapy Documentation Precautions:  Precautions Precautions: Fall Precaution Comments: trach 28%/5L; pelvic restraint in bed, PMSV with staff, very low voice quality Required Braces or Orthoses: Other Brace Other Brace: abdominal binder - for PEG Restrictions Weight Bearing Restrictions: No    Vital Signs: Therapy Vitals Temp: 98.3 F (36.8 C) Pulse Rate: 77 Resp: 19 BP:  126/77 Patient Position (if appropriate): Standing Oxygen Therapy SpO2: 100 % O2 Device: Tracheostomy Collar O2 Flow Rate (L/min): 5 L/min Pain: denies   Therapy/Group: Individual Therapy  AusLorie Phenix9/2022, 3:04 PM

## 2020-05-07 NOTE — Patient Care Conference (Signed)
Inpatient RehabilitationTeam Conference and Plan of Care Update Date: 05/07/2020   Time: 11:05 AM    Patient Name: Stacy Moran      Medical Record Number: [id number]  Date of Birth: [birth year] Sex: Female         Room/Bed: 4M01C/4M01C-01 Payor Info: Payor: MEDICAID POTENTIAL / Plan: MEDICAID POTENTIAL / Product Type: *No Product type* /    Admit Date/Time:  04/22/2020  2:29 PM  Primary Diagnosis:  ICH (intracerebral hemorrhage) [facility])  Hospital Problems: Principal Problem:   ICH (intracerebral hemorrhage) ([place]) Active Problems:   Benign essential HTN   Dysphagia   Status post tracheostomy ([place])   S/P percutaneous endoscopic gastrostomy (PEG) tube placement ([place])   Protein-calorie malnutrition, severe   Dysphagia, post-stroke   Essential hypertension   Seizure prophylaxis   Acute blood loss anemia    Expected Discharge Date: Expected Discharge Date: 05/21/20  Team Members Present: Physician leading conference: Dr. Delice Lesch Care Coodinator Present: Dorien Chihuahua, RN, BSN, CRRN;Becky Dupree, LCSW Nurse Present: Mohammed Kindle, RN PT Present: Ginnie Smart, PT OT Present: Meriel Pica, OT SLP Present: Jettie Booze, CF-SLP PPS Coordinator present : Gunnar Fusi, SLP     Current Status/Progress Goal Weekly Team Focus  Bowel/Bladder   Continent of urine with min. incontinent episodes. LBM 2/7  Pt will remain continent throughout stay  q2 toileting/ rounds, stool softners PRN   Swallow/Nutrition/ Hydration   NPO, trials ice chips, repeat MBS when secretoins improve  Min A  trials ice with ST, repeat MBS   ADL's   min to mod A overall depending on attention span, initiation, motivation to participate; behavioral deficits in relation to cooperating with therapy  supervision to CGA overall  ADL training, balance, cognition, endurance, coordination/praxis   Mobility   Fluctuating based on alertness and willingness to participate. minA for bed mobility, minA for stand<>pivot  transfers. minA +2 (for safety) gait up to ~223ft.  CGA/supervision - downgraded all goals to CGA due to decreased safety awareness and impulsivity  Functional task initiation, gait training, safety awareness, attenuation to tasks   Communication   Aphonic, mouthing words  Min-Mod A  writing, voicing, multimodal communication, following basic commands   Safety/Cognition/ Behavioral Observations  Mod A  Min A  basic problem solving, sustained attention and error awareness   Pain   pt has no complaints of pain  pt pain level will remain  <2 throughout stay  assess q shift, give PRN pain meds   Skin   crusty trach site, no current breakdown  Pt will not develop any new skin breakdown  Assess skin q shift, clean trach/ trach site PRN     Discharge Planning:  Daughter planning on tkaing FMLA and paperwork completed-will need extensive family education if home with trach. Pt is uninsured and not eligible for services.   Team Discussion: Patient is continent with timed toileting. Continue to note thick/copious secretions via trach. Patient able to cough up secretions however no able to expectorate adequately to clear airway. Poor tolerance to PMSV; anticipate will discharge home with the trach. Remains NPO with PEG. Patient on target to meet rehab goals: Currently min assist for self care if she is able to stay focused during the activity. MD to adjust ritalin. Progress limited by internal and external distractions, impulsive, limited participation and requires cues for initiation and is unsafe as she will respond without warning to distractions.   *See Care Plan and progress notes for long and short-term goals.   Revisions  to Treatment Plan:  Trials of ice chips with SLP  Teaching Needs: Trach care, PEG care, nutritional means, medications, safety, etc.  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  Barriers to Discharge: Behavior;Medical stability;New oxygen;Decreased family/caregiver support;Trach;Incontinence  Barriers to Discharge Comments: Secretions Possible Resolutions to Barriers/Weekly Focus: Therapies, monitor Trach/PEG, weaned seizure meds - monitor for seizures, meds to decrease sections   Continued Need for Acute Rehabilitation Level of Care: The patient requires daily medical management by a physician with specialized training in physical medicine and rehabilitation for the following reasons: Direction of a multidisciplinary physical rehabilitation program to maximize functional independence : Yes Medical management of patient stability for increased activity during participation in an intensive rehabilitation regime.: Yes Analysis of laboratory values and/or radiology reports with any subsequent need for medication adjustment and/or medical intervention. : Yes   I attest that I was present, lead the team conference, and concur with the assessment and plan of the team.   Dorien Chihuahua B 05/07/2020, 1:31 PM

## 2020-05-08 LAB — GLUCOSE, CAPILLARY
Glucose-Capillary: 105 mg/dL — ABNORMAL HIGH (ref 70–99)
Glucose-Capillary: 109 mg/dL — ABNORMAL HIGH (ref 70–99)
Glucose-Capillary: 134 mg/dL — ABNORMAL HIGH (ref 70–99)
Glucose-Capillary: 136 mg/dL — ABNORMAL HIGH (ref 70–99)
Glucose-Capillary: 75 mg/dL (ref 70–99)
Glucose-Capillary: 99 mg/dL (ref 70–99)

## 2020-05-08 NOTE — Progress Notes (Incomplete)
{  All PT Notes:[id number]}

## 2020-05-08 NOTE — Progress Notes (Signed)
Speech Language Pathology Daily Session Note  Patient Details  Name: Stacy Moran MRN: [id number] Date of Birth: May 07, 1955  Today's Date: 05/08/2020 SLP Individual Time: 1330-1355 SLP Individual Time Calculation (min): 25 min  Short Term Goals: Week 3: SLP Short Term Goal 1 (Week 3): Pt will tolerate PMSV for 25 minutes without s/s of distress over x2 opportunites. SLP Short Term Goal 2 (Week 3): Pt will demonstrate sustained attention in 3 minute intervals with mod A multimodal cues. SLP Short Term Goal 3 (Week 3): Pt will initate with extra time 1 step commands/basic problem solving tasks with min A multimodal cues in 70% of opportunties. SLP Short Term Goal 4 (Week 3): Pt will consume trials of ice chips with moderate s/s aspiration and reduced expeling of mucus secretions, prior to repeat MBS. SLP Short Term Goal 5 (Week 3): Pt will name objects at written word level with minimal spelling errors given min A verbal cues in 70% of opportunties. SLP Short Term Goal 6 (Week 3): Pt will attempt phonation in 3 out 5 opportunties with mod A multimodal cues.  Skilled Therapeutic Interventions: Pt was seen for skilled ST targeting dysphagia and cognitive goals. SLP donned PMSV upon arrival, and her SpO2 remained stable between 95-100 throughout session, no appreciable change noted in HR or RR either. Pt required Moderate cueing for sequencing and redirection to task when completing oral care via suction toothbrush. Once she started to correctly perform oral care, she did become somewhat perseverative on the action. She accepted trial of 1 ice chip with poor attention to bolus, oral holding, multiple swallows and delayed prolonged coughing noted following swallow. Recommend continue NPO for now. Pt left sitting upright in wheelchair with restraints still applied, seatbelt alarm on, call bell in hand. Continue per current plan of care.          Pain Pain Assessment Pain Scale: Faces Faces Pain  Scale: No hurt  Therapy/Group: Individual Therapy  Arbutus Leas 05/08/2020, 7:21 AM

## 2020-05-08 NOTE — Progress Notes (Signed)
Occupational Therapy Session Note  Patient Details  Name: Stacy Moran MRN: [id number] Date of Birth: 24-Mar-1956  Today's Date: 05/08/2020 OT Individual Time: 0930-1030 OT Individual Time Calculation (min): 60 min    Short Term Goals: Week 2:  OT Short Term Goal 1 (Week 2): Pt will complete bathroom transfer with min HHA OT Short Term Goal 2 (Week 2): Pt will complete LB dressing with min A OT Short Term Goal 3 (Week 2): Pt will require no more than min cueing for focused attention to ADL task OT Short Term Goal 4 (Week 2): Pt will complete sinkside bathing with min assist and mod cueing.  Skilled Therapeutic Interventions/Progress Updates:    Pt seen this session with +2 A. Pt received awake and alert sitting up in bed.  With MAX tactile, verbal cues - pt got out of bed, ambulated to toilet, sat on toilet and urinated (was continent), walked to wc by sink with +2 A but only for min A HHA.  Completed bathing and dressing with min A and MAX cues due to severely impaired attention. Pt grabbing at items, pulling on tubes, perseverative behavior, needing constant redirection.   With +2 A and 3rd person pulling portable O2 (4L) and bring wc.  Pt ambulated 250 feet min A.  At end of walk, O2 sats 100% and HR 73.    Pt returned to room and motioned she wanted to stay in wc.  Belt alarm on, posey belt on, binder on.  Informed RN that pulse ox would not stay on her finger.  RN to adjust.    Therapy Documentation Precautions:  Precautions Precautions: Fall Precaution Comments: trach 28%/5L; pelvic restraint in bed, PMSV with staff, very low voice quality Required Braces or Orthoses: Other Brace Other Brace: abdominal binder - for PEG Restrictions Weight Bearing Restrictions: No    Vital Signs: Therapy Vitals Pulse Rate: 79 Resp: 16 Patient Position (if appropriate): Lying Oxygen Therapy SpO2: 98 % (Simultaneous filing. User may not have seen previous data.) O2 Device: Tracheostomy  Collar (Simultaneous filing. User may not have seen previous data.) O2 Flow Rate (L/min): 5 L/min (Simultaneous filing. User may not have seen previous data.) FiO2 (%): 28 % (Simultaneous filing. User may not have seen previous data.) Pain: no c/o pain   ADL: ADL Eating: NPO Grooming: Minimal assistance Where Assessed-Grooming: Edge of bed Upper Body Bathing: Supervision/safety Where Assessed-Upper Body Bathing: Shower Lower Body Bathing: Contact guard Where Assessed-Lower Body Bathing: Shower Upper Body Dressing: Setup Where Assessed-Upper Body Dressing: Wheelchair Lower Body Dressing: Moderate cueing Where Assessed-Lower Body Dressing: Wheelchair Toileting:  (refused) Where Assessed-Toileting: Glass blower/designer:  (refused) Armed forces technical officer Method: Counselling psychologist: Event organiser: Environmental education officer Method: Radiographer, therapeutic: Grab bars,Transfer tub bench   Therapy/Group: Individual Therapy  [place] 05/08/2020, 1:16 PM

## 2020-05-08 NOTE — Progress Notes (Signed)
[place] PHYSICAL MEDICINE & REHABILITATION PROGRESS NOTE  Subjective/Complaints: Patient seen sitting up in bed this morning.  She states she slept well overnight.  No reported issues overnight.  Discussed secretions with nursing, which appear to be improving.  ROS: limited due to cognition  Objective: Vital Signs: Blood pressure (!) 156/69, pulse 84, temperature 98.7 F (37.1 C), resp. rate 17, height 5\' 9"  (1.753 m), weight 59.3 kg, SpO2 99 %. No results found. No results for input(s): WBC, HGB, HCT, PLT in the last 72 hours. No results for input(s): NA, K, CL, CO2, GLUCOSE, BUN, CREATININE, CALCIUM in the last 72 hours. No intake or output data in the 24 hours ending 05/08/20 1105      Physical Exam: BP (!) 156/69 (BP Location: Right Arm)    Pulse 84    Temp 98.7 F (37.1 C)    Resp 17    Ht 5\' 9"  (1.753 m)    Wt 59.3 kg    SpO2 99%    BMI 19.31 kg/m  Constitutional: No distress . Vital signs reviewed. HENT: Normocephalic.   Scalp with BVS Neck: + #Post with trach collar with secretions Eyes: EOMI. No discharge. Cardiovascular: No JVD.  RRR. Respiratory: Normal effort.  No stridor.  Upper airway sounds GI: Non-distended.  BS +.  + PEG Skin: Warm and dry.  Intact. Psych: Flat..  Normal behavior. Musc: No edema in extremities.  No tenderness in extremities. Neuro: Alert Motor: 4+-5/5 throughout, unchanged  Assessment/Plan: 1. Functional deficits which require 3+ hours per day of interdisciplinary therapy in a comprehensive inpatient rehab setting.  Physiatrist is providing close team supervision and 24 hour management of active medical problems listed below.  Physiatrist and rehab team continue to assess barriers to discharge/monitor patient progress toward functional and medical goals   Care Tool:  Bathing    Body parts bathed by patient: Right arm,Left arm,Chest,Abdomen,Right upper leg,Left upper leg,Right lower leg,Left lower leg,Face,Front perineal  area,Buttocks   Body parts bathed by helper: Front perineal area,Buttocks     Bathing assist Assist Level: Contact Guard/Touching assist     Upper Body Dressing/Undressing Upper body dressing   What is the patient wearing?: Pull over shirt    Upper body assist Assist Level: Set up assist    Lower Body Dressing/Undressing Lower body dressing      What is the patient wearing?: Incontinence brief,Pants     Lower body assist Assist for lower body dressing: Moderate Assistance - Patient 50 - 74%     Toileting Toileting Toileting Activity did not occur Landscape architect and hygiene only): Refused  Toileting assist Assist for toileting: Moderate Assistance - Patient 50 - 74%     Transfers Chair/bed transfer  Transfers assist     Chair/bed transfer assist level: Minimal Assistance - Patient > 75%     Locomotion Ambulation   Ambulation assist      Assist level: 2 helpers Assistive device: Other (comment) (HHA and hand rail) Max distance: 65   Walk 10 feet activity   Assist  Walk 10 feet activity did not occur: Refused  Assist level: 2 helpers Assistive device: Other (comment) (HHA and hand rail.)   Walk 50 feet activity   Assist Walk 50 feet with 2 turns activity did not occur: Refused  Assist level: 2 helpers Assistive device: Other (comment) (HHA and hand rail.)    Walk 150 feet activity   Assist Walk 150 feet activity did not occur: Refused  Walk 10 feet on uneven surface  activity   Assist Walk 10 feet on uneven surfaces activity did not occur: Refused         Wheelchair     Assist Will patient use wheelchair at discharge?: Yes Type of Wheelchair: Manual Wheelchair activity did not occur: Refused         Wheelchair 50 feet with 2 turns activity    Assist            Wheelchair 150 feet activity     Assist           Medical Problem List and Plan: 1. 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  Continue CIR 2.  Antithrombotics: -DVT/anticoagulation: Subcutaneous heparin             -antiplatelet therapy: N/A 3. Pain Management: Tylenol as needed 4. Mood: Provide emotional support             -antipsychotic agents: Seroquel 25 mg twice daily   Agitation improved             -pt is fall risk 5. Neuropsych: This patient is not capable of making decisions on her own behalf.  Ritalin twice daily started on 2/2 with improvement, increased on 2/9 6. Skin/Wound Care: Routine skin checks 7. Fluids/Electrolytes/Nutrition: Routine in and outs  BMP within acceptable range on 2/7 8. Seizure prophylaxis.   Keppra decreased to 500 BID on 1/28, decreased further on 2/2, DC'd on 2/7 9. 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 Constance Holster.   Maintained on #6 cuffless [place] currently, per ENT, plan to downsize to #6-will hold off at present due to persistent secretions  Trial Glycopyrolate initiated  10.  Post stroke dysphagia: Gastrostomy tube 02/22/2020 per interventional radiology Dr. Earleen Newport.       Dietary and speech therapy follow-up  Advanced diet as tolerated 11. Hyperglycemia related to tube feeds. SSI   CBG (last 3)  Recent Labs    05/08/20 0021 05/08/20 0409 05/08/20 0826  GLUCAP 99 105* 134*   Slightly labile on 2/10 12. Hypothyroidism: Syndthroid 65. Hypertension. Hydralazine 50 mg every 8 hours, Normodyne 300 mg 3 times daily, Norvasc 10 mg daily   Vitals:   05/08/20 0409 05/08/20 0800  BP: (!) 156/69   Pulse: 63 84  Resp: 16 17  Temp: 98.7 F (37.1 C)   SpO2: 100% 99%   Elevated this morning, otherwise relatively controlled, monitor trend  Monitor with increased  mobility 13.  Acute blood loss anemia  Hemoglobin 11.9 on 2/7  Continue to monitor  LOS: 16 days A FACE TO FACE EVALUATION WAS PERFORMED  Dyana Magner Lorie Phenix 05/08/2020, 11:05 AM

## 2020-05-08 NOTE — Progress Notes (Signed)
Patient ID: Stacy Moran, female   DOB: 11/19/1955, 65 y.o.   MRN: [id number] Have spoken with daughter to inform to be here at 10 on Sat to begin family education. Have also re-faxed corrected FMLA paperwork. Daughter to be here Sat to begin family training.

## 2020-05-08 NOTE — Progress Notes (Signed)
Physical Therapy Session Note  Patient Details  Name: Stacy Moran MRN: [id number] Date of Birth: [birth year]  Today's Date: 05/08/2020 PT Individual Time: 0800-0842 PT Individual Time Calculation (min): 42 min   Short Term Goals: Week 3:  PT Short Term Goal 1 (Week 3): Pt will complete bed mobility with supervision PT Short Term Goal 2 (Week 3): Pt will consistently perform bed<>chair transfers with CGA and LRAD PT Short Term Goal 3 (Week 3): Pt will ambulate 164ft with minA of 1 person and LRAD PT Short Term Goal 4 (Week 3): Pt will initiate stair training PT Short Term Goal 5 (Week 3): Pt will consistently demonstrate improved safety awareness and decreased impulsivity with functional mobility tasks  Skilled Therapeutic Interventions/Progress Updates:    Patient received supine in bed, agreeable to PT. She denies pain when asked. RT present for trach care + deep suctioning at beginning of session. Patient maintained very wet quality to cough and low voice throughout session with copious secretions. PT requesting that patient don [ants; however, she doffed her brief instead. Patient required max cuing and encouragement to don brief and pants. She was able to come to sit edge of bed with supervision and max encouragement. Patient pulling at [facility] tubing and PEG tubing throughout entirety of session requiring max redirection. Patient able to change shirt with ModA sitting edge of bed. She required assist of 2 to return supine due to poor attention to task and consistent fidgeting with lines. Pelvic restraint reapplied. Bed alarm on, call light within reach.   Therapy Documentation Precautions:  Precautions Precautions: Fall Precaution Comments: trach 28%/5L; pelvic restraint in bed, PMSV with staff, very low voice quality Required Braces or Orthoses: Other Brace Other Brace: abdominal binder - for PEG Restrictions Weight Bearing Restrictions: No    Therapy/Group: Individual  Therapy  Karoline Caldwell, PT, DPT, CBIS  05/08/2020, 7:39 AM

## 2020-05-08 NOTE — Progress Notes (Signed)
Physical Therapy Session Note  Patient Details  Name: Stacy Moran MRN: [id number] Date of Birth: 10/25/1955  Today's Date: 05/08/2020 PT Individual Time: 1100-1155 PT Individual Time Calculation (min): 55 min   Short Term Goals: Week 3:  PT Short Term Goal 1 (Week 3): Pt will complete bed mobility with supervision PT Short Term Goal 2 (Week 3): Pt will consistently perform bed<>chair transfers with CGA and LRAD PT Short Term Goal 3 (Week 3): Pt will ambulate 112ft with minA of 1 person and LRAD PT Short Term Goal 4 (Week 3): Pt will initiate stair training PT Short Term Goal 5 (Week 3): Pt will consistently demonstrate improved safety awareness and decreased impulsivity with functional mobility tasks  Skilled Therapeutic Interventions/Progress Updates:   Pt greeted seated in w/c, connected to 5L wall O2 via trach mask, saturating at 100% with HR 70's, in no distress. Agreeable to therapy. Pt without PEG feeding running. As therapist was preparing patient for functional mobility (removing pelvic restraint, safety belt alarm, connecting to portable tank, etc). As therapist doing was completing this, patient was VERY restless and VERY impulsive with grabbing EVERTHING such as pelvic restraint, trach tubing, oxygen tank, w/c parts, oxygen monitor, etc. She required MAX cues for limiting this behavior but there was no carryover throughout our entire session. Her sustained and focused attention was significantly impaired. Ambulated to the bathroom with modA via hand held assist and +2 assist for line management. Pt continued to grab everything in her path (trash can, bed rail, computer, laundry bag). Required modA for sitting to 3-1 BSC over the toilet where, when time provided, she was continent of bladder. RN notified after session of completing of timed toileting. Pt able to stand with minA from [facility] but required modA for standing balance while she pulled pants over hips. Ambulated sinkside with modA  and +2 assist for line's and pt required minA for standing balance while she washed her hands. MinA for stand>sit to w/c and patient was transported to main therapy hallway. Placed x6 cones (x3 on L and x3 on R) along length of hallway and instructed her to visually scan and retrieve them as she ambulated the hallway. Pt required modA for gait due to impulsivity and balance deficits with +2 assist for line management, required max verbal cues for identifying the bright orange cones and she missed all x3 cones that were on her L side despite cues. She ambulated ~249ft while completing this. Wheeled to ortho gym and performed stand,>pivot transfer to [employer] with minA. Required assist for foot placement onto the paddles and she was able to complete around x1 minute without distractions but then she began focusing on the computer panel on the [employer] and reaching for othe robjects in the gym. Deferred further efforts. MinA stand,>pivot back to her w/c and returned to her room. Donned pelvic restraint, safety belt alarm, and reconnected to wall O2 at end of session.  Therapy Documentation Precautions:  Precautions Precautions: Fall Precaution Comments: trach 28%/5L; pelvic restraint in bed, PMSV with staff, very low voice quality Required Braces or Orthoses: Other Brace Other Brace: abdominal binder - for PEG Restrictions Weight Bearing Restrictions: No  Therapy/Group: Individual Therapy  Gwendolen Hewlett P Marybeth Dandy PT 05/08/2020, 7:40 AM

## 2020-05-09 LAB — GLUCOSE, CAPILLARY
Glucose-Capillary: 104 mg/dL — ABNORMAL HIGH (ref 70–99)
Glucose-Capillary: 120 mg/dL — ABNORMAL HIGH (ref 70–99)
Glucose-Capillary: 126 mg/dL — ABNORMAL HIGH (ref 70–99)
Glucose-Capillary: 130 mg/dL — ABNORMAL HIGH (ref 70–99)
Glucose-Capillary: 134 mg/dL — ABNORMAL HIGH (ref 70–99)
Glucose-Capillary: 92 mg/dL (ref 70–99)

## 2020-05-09 MED ORDER — OSMOLITE 1.2 CAL PO LIQD
355.0000 mL | Freq: Three times a day (TID) | ORAL | Status: DC
  Administered 2020-05-09: 119 mL
  Administered 2020-05-09 – 2020-05-21 (×46): 355 mL
  Filled 2020-05-09 (×52): qty 474

## 2020-05-09 MED ORDER — FREE WATER
200.0000 mL | Freq: Four times a day (QID) | Status: DC
  Administered 2020-05-09 – 2020-05-21 (×46): 200 mL

## 2020-05-09 NOTE — Progress Notes (Signed)
Physical Therapy Session Note  Patient Details  Name: Stacy Moran MRN: [id number] Date of Birth: Dec 27, 1955  Today's Date: 05/09/2020 PT Individual Time: 0800-0810 PT Individual Time Calculation (min): 10 min   Short Term Goals: Week 3:  PT Short Term Goal 1 (Week 3): Pt will complete bed mobility with supervision PT Short Term Goal 2 (Week 3): Pt will consistently perform bed<>chair transfers with CGA and LRAD PT Short Term Goal 3 (Week 3): Pt will ambulate 132ft with minA of 1 person and LRAD PT Short Term Goal 4 (Week 3): Pt will initiate stair training PT Short Term Goal 5 (Week 3): Pt will consistently demonstrate improved safety awareness and decreased impulsivity with functional mobility tasks  Skilled Therapeutic Interventions/Progress Updates:    Coordinated with RN to pause PEG feedings to assist with safety during OOB functional mobility training. On arrival, patient sleeping with lights off and curtains drawn. Pt opens eyes to voice but then immediately shuts them. Turned lights on and drew curtains to allow sunlight to reduce day/night confusion. Pt continued to remain sleeping despite max verbal and tactile engagement. Attempted to initiate bed mobility but pt was resistant and non participatory. Deferred further efforts. Pt remained sidelying in bed with bed alarm on, pelvic restraint repositioned, RN notified.  Therapy Documentation Precautions:  Precautions Precautions: Fall Precaution Comments: trach 28%/5L; pelvic restraint in bed, PMSV with staff, very low voice quality Required Braces or Orthoses: Other Brace Other Brace: abdominal binder - for PEG Restrictions Weight Bearing Restrictions: No General: PT Amount of Missed Time (min): 50 Minutes PT Missed Treatment Reason: Patient unwilling to participate;Patient fatigue  Therapy/Group: Individual Therapy  Detrich Rakestraw P Tallie Hevia PT 05/09/2020, 7:35 AM

## 2020-05-09 NOTE — Progress Notes (Signed)
Speech Language Pathology Daily Session Note  Patient Details  Name: Stacy Moran MRN: [id number] Date of Birth: Dec 18, 1955  Today's Date: 05/09/2020 SLP Individual Time: 7078-6754 SLP Individual Time Calculation (min): 15 min  Short Term Goals: Week 3: SLP Short Term Goal 1 (Week 3): Pt will tolerate PMSV for 25 minutes without s/s of distress over x2 opportunites. SLP Short Term Goal 2 (Week 3): Pt will demonstrate sustained attention in 3 minute intervals with mod A multimodal cues. SLP Short Term Goal 3 (Week 3): Pt will initate with extra time 1 step commands/basic problem solving tasks with min A multimodal cues in 70% of opportunties. SLP Short Term Goal 4 (Week 3): Pt will consume trials of ice chips with moderate s/s aspiration and reduced expeling of mucus secretions, prior to repeat MBS. SLP Short Term Goal 5 (Week 3): Pt will name objects at written word level with minimal spelling errors given min A verbal cues in 70% of opportunties. SLP Short Term Goal 6 (Week 3): Pt will attempt phonation in 3 out 5 opportunties with mod A multimodal cues.  Skilled Therapeutic Interventions:   Patient seen for skilled ST session however SLP ended session after 15 minutes due to patient not participating. SLP did observe that at rest, patient not exhibiting audible tracheal secretions. SLP donned PMV valve and patient tolerated without any observable changes in vitals, etc. Patient responded (mouthing words, but no attempts to phonate or even speak with whisper at one-word level two times, responding to yes/no questions. She did not open eyes but did flinch to sunlight coming from window. Of note, patient did not participate for PT session earlier this morning. SLP will attempt to return to see patient later this date schedule permitting. Family education planned with daughter 2/12.  Pain Pain Assessment Pain Scale: Faces Faces Pain Scale: No hurt  Therapy/Group: Individual Therapy  Sonia Baller, MA, CCC-SLP Speech Therapy

## 2020-05-09 NOTE — Progress Notes (Signed)
[facility] PHYSICAL MEDICINE & REHABILITATION PROGRESS NOTE  Subjective/Complaints: Patient seen sitting up in bed this AM.  She indicates she slept well overnight. No reported issues overnight.   ROS: limited due to cognition.   Objective: Vital Signs: Blood pressure (!) 137/98, pulse 69, temperature (!) 97.3 F (36.3 C), temperature source Oral, resp. rate 16, height 5\' 9"  (1.753 m), weight 59.3 kg, SpO2 100 %. No results found. No results for input(s): WBC, HGB, HCT, PLT in the last 72 hours. No results for input(s): NA, K, CL, CO2, GLUCOSE, BUN, CREATININE, CALCIUM in the last 72 hours. No intake or output data in the 24 hours ending 05/09/20 0851      Physical Exam: BP (!) 137/98 (BP Location: Left Arm)   Pulse 69   Temp (!) 97.3 F (36.3 C) (Oral)   Resp 16   Ht 5\' 9"  (1.753 m)   Wt 59.3 kg   SpO2 100%   BMI 19.31 kg/m  Constitutional: No distress . Vital signs reviewed. HENT: Normocephalic.  Atraumatic. Scalp with VPS Neck: + #6 trach with trach collar and secretion Eyes: EOMI. No discharge. Cardiovascular: No JVD.  RRR. Respiratory: Normal effort.  No stridor.  Bilateral clear to auscultation.  + Upper airway sounds. GI: Non-distended.  BS +.  + PEG. Skin: Warm and dry.  Intact. Psych: Normal mood.  Normal behavior. Musc: No edema in extremities.  No tenderness in extremities. Neuro: Alert Motor: 4+-5/5 throughout, stable  Assessment/Plan: 1. Functional deficits which require 3+ hours per day of interdisciplinary therapy in a comprehensive inpatient rehab setting.  Physiatrist is providing close team supervision and 24 hour management of active medical problems listed below.  Physiatrist and rehab team continue to assess barriers to discharge/monitor patient progress toward functional and medical goals   Care Tool:  Bathing    Body parts bathed by patient: Right arm,Left arm,Chest,Abdomen,Right upper leg,Left upper leg,Right lower leg,Left lower  leg,Face,Front perineal area,Buttocks   Body parts bathed by helper: Front perineal area,Buttocks     Bathing assist Assist Level: Minimal Assistance - Patient > 75%     Upper Body Dressing/Undressing Upper body dressing   What is the patient wearing?: Pull over shirt    Upper body assist Assist Level: Minimal Assistance - Patient > 75%    Lower Body Dressing/Undressing Lower body dressing      What is the patient wearing?: Incontinence brief,Pants     Lower body assist Assist for lower body dressing: Moderate Assistance - Patient 50 - 74%     Toileting Toileting Toileting Activity did not occur Landscape architect and hygiene only): Refused  Toileting assist Assist for toileting: Moderate Assistance - Patient 50 - 74%     Transfers Chair/bed transfer  Transfers assist     Chair/bed transfer assist level: Minimal Assistance - Patient > 75%     Locomotion Ambulation   Ambulation assist      Assist level: 2 helpers Assistive device: Other (comment) (HHA and hand rail) Max distance: 65   Walk 10 feet activity   Assist  Walk 10 feet activity did not occur: Refused  Assist level: 2 helpers Assistive device: Other (comment) (HHA and hand rail.)   Walk 50 feet activity   Assist Walk 50 feet with 2 turns activity did not occur: Refused  Assist level: 2 helpers Assistive device: Other (comment) (HHA and hand rail.)    Walk 150 feet activity   Assist Walk 150 feet activity did not occur: Refused  Walk 10 feet on uneven surface  activity   Assist Walk 10 feet on uneven surfaces activity did not occur: Refused         Wheelchair     Assist Will patient use wheelchair at discharge?: Yes Type of Wheelchair: Manual Wheelchair activity did not occur: Refused         Wheelchair 50 feet with 2 turns activity    Assist            Wheelchair 150 feet activity     Assist           Medical Problem List and  Plan: 1. 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  Continue CIR 2.  Antithrombotics: -DVT/anticoagulation: Subcutaneous heparin             -antiplatelet therapy: N/A 3. Pain Management: Tylenol as needed 4. Mood: Provide emotional support             -antipsychotic agents: Seroquel 25 mg twice daily   Agitation improved             -pt is fall risk 5. Neuropsych: This patient is not capable of making decisions on her own behalf.  Ritalin twice daily started on 2/2 with improvement, increased on 2/9 monitor for improvement 6. Skin/Wound Care: Routine skin checks 7. Fluids/Electrolytes/Nutrition: Routine in and outs  BMP within acceptable range on 2/7, labs ordered for Monday 8. Seizure prophylaxis.   Keppra decreased to 500 BID on 1/28, decreased further on 2/2, DC'd on 2/7 9. 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 Constance Holster.   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 Earleen Newport.       Dietary and speech therapy follow-up  Will change tube feeds to bolus feeds-discussed with dietary.  Advanced diet as tolerated 11. Hyperglycemia related to tube feeds. SSI   CBG (last 3)  Recent Labs    05/09/20 0058 05/09/20 0411 05/09/20 0756  GLUCAP 104* 134* 120*   Slightly elevated on 2/11, monitor in accordance with tube feed changes 12. Hypothyroidism: Syndthroid 13. Hypertension. Hydralazine 50 mg every 8 hours, Normodyne 300 mg 3 times daily, Norvasc 10 mg  daily   Vitals:   05/09/20 0442 05/09/20 0723  BP: (!) 137/98   Pulse: 68 69  Resp: 16 16  Temp:    SpO2: 100% 100%   Relatively controlled for the most part on 2/11  Monitor with increased mobility 13.  Acute blood loss anemia  Hemoglobin 11.9 on 2/7, labs ordered for Monday  Continue to monitor  LOS: 17 days A FACE TO FACE EVALUATION WAS PERFORMED  Stacy Moran Stacy Moran 05/09/2020, 8:51 AM

## 2020-05-09 NOTE — Procedures (Signed)
Tracheostomy Change Note  Patient Details:   Name: Raylei Losurdo DOB: 1955/04/27 MRN: [id number]    Airway Documentation:     Evaluation  O2 sats: stable throughout Complications: No apparent complications Patient did tolerate procedure well. Bilateral Breath Sounds: Clear,Diminished   Routine trach change was done with a #6 cuffless Shiley without any complications.Lurline Idol was secured with trach ties. Positive color change on CO2 detector.     Claretta Fraise 05/09/2020, 4:35 PM

## 2020-05-09 NOTE — Progress Notes (Signed)
Occupational Therapy Session Note  Patient Details  Name: Stacy Moran MRN: [id number] Date of Birth: December 28, 1955  Today's Date: 05/09/2020 OT Individual Time: 1130-1200 OT Individual Time Calculation (min): 30 min    Short Term Goals: Week 2:  OT Short Term Goal 1 (Week 2): Pt will complete bathroom transfer with min HHA OT Short Term Goal 2 (Week 2): Pt will complete LB dressing with min A OT Short Term Goal 3 (Week 2): Pt will require no more than min cueing for focused attention to ADL task OT Short Term Goal 4 (Week 2): Pt will complete sinkside bathing with min assist and mod cueing.  Skilled Therapeutic Interventions/Progress Updates:    Pt received in bed awake with covers pulled up and shirt on but no pants.  Asked pt if she needed to toilet ( pt's tech told me she just had a incontinent episode an hour ago).   Placed BSC next to bed and asked pt to sit up. Had to remove everything from the bed as pt kept grabbing at items.  Tactile guiding to get pt started with mod a to sit up due to pt not initiating.  Pt sat on EOB with S.  Pt kept closing lid of BSC shaking head no.  So had pt don pants with min A over feet, min to stand.  Pt kept trying to pull on gtube connected to feeding line.  Had pt hold my hands instead and she stepped to wc with min A.  In wc tried to have her wash her face, but she kept putting washcloth down. Placed passey muir valve on for 2 min to try to have pt vocalize but no sound.  Removed valve.  Donned posey belt, quick release belt.  Call light in reach.    Therapy Documentation Precautions:  Precautions Precautions: Fall Precaution Comments: trach 28%/5L; pelvic restraint in bed, PMSV with staff, very low voice quality Required Braces or Orthoses: Other Brace Other Brace: abdominal binder - for PEG Restrictions Weight Bearing Restrictions: No  Vital Signs: Therapy Vitals Pulse Rate: 69 Resp: 16 Oxygen Therapy SpO2: 100 % O2 Device: Tracheostomy  Collar O2 Flow Rate (L/min): 5 L/min FiO2 (%): 28 % Pain: no c/o pain   ADL: ADL Eating: NPO Grooming: Minimal assistance Where Assessed-Grooming: Edge of bed Upper Body Bathing: Supervision/safety Where Assessed-Upper Body Bathing: Shower Lower Body Bathing: Contact guard Where Assessed-Lower Body Bathing: Shower Upper Body Dressing: Setup Where Assessed-Upper Body Dressing: Wheelchair Lower Body Dressing: Moderate cueing Where Assessed-Lower Body Dressing: Wheelchair Toileting:  (refused) Where Assessed-Toileting: Glass blower/designer:  (refused) Armed forces technical officer Method: Counselling psychologist: Event organiser: Environmental education officer Method: Radiographer, therapeutic: Grab bars,Transfer tub bench   Therapy/Group: Individual Therapy  [place] 05/09/2020, 8:49 AM

## 2020-05-09 NOTE — Progress Notes (Signed)
Occupational Therapy Weekly Progress Note  Patient Details  Name: Stacy Moran MRN: [id number] Date of Birth: [birth year]  Beginning of progress report period: April 30, 2020 End of progress report period: May 09, 2020   Patient has met 3 of 4 short term goals.  Pt has made progress with her self care and mobility to a min A level but her attention span and impulsivity continues to be severely impaired. Because she has a trach with oxygen, feeding tube, and continuous pulse ox, she needs 2 person assist to have second person assisting to ensure she is not pulling on tubes and/or helping with equipment with ambulation.  She continues to be incontinent and communication with pt is very challenging as she is still non verbal.    Therapy will continue to focus on balance, attention, decreased impulsivity so pt is safe to go home with her daughter.    Patient continues to demonstrate the following deficits: muscle weakness, decreased cardiorespiratoy endurance and decreased oxygen support, motor apraxia, decreased visual acuity, ideational apraxia, decreased initiation, decreased attention, decreased awareness, decreased problem solving, decreased safety awareness, decreased memory and delayed processing and decreased sitting balance, decreased standing balance, decreased postural control and decreased balance strategies and therefore will continue to benefit from skilled OT intervention to enhance overall performance with BADL.  Patient not progressing toward long term goals.  See goal revision..  Plan of care revisions: LTGs will be downgraded from supervision/ CGA to MIN A. .  OT Short Term Goals Week 1:  OT Short Term Goal 1 (Week 1): Pt will sit EOB with no LOB during 1 ADL task OT Short Term Goal 1 - Progress (Week 1): Met OT Short Term Goal 2 (Week 1): Pt will complete bathroom transfer with min HHA OT Short Term Goal 2 - Progress (Week 1): Progressing toward goal OT Short Term Goal 3  (Week 1): Pt will complete LB dressing with min A OT Short Term Goal 3 - Progress (Week 1): Progressing toward goal OT Short Term Goal 4 (Week 1): Pt will require no more than min cueing for focused attention to ADL task OT Short Term Goal 4 - Progress (Week 1): Progressing toward goal Week 2:  OT Short Term Goal 1 (Week 2): Pt will complete bathroom transfer with min HHA OT Short Term Goal 1 - Progress (Week 2): Met OT Short Term Goal 2 (Week 2): Pt will complete LB dressing with min A OT Short Term Goal 2 - Progress (Week 2): Met OT Short Term Goal 3 (Week 2): Pt will require no more than min cueing for focused attention to ADL task OT Short Term Goal 3 - Progress (Week 2): Not progressing OT Short Term Goal 4 (Week 2): Pt will complete sinkside bathing with min assist and mod cueing. OT Short Term Goal 4 - Progress (Week 2): Met Week 3:  OT Short Term Goal 1 (Week 3): Pt will complete stand pivot transfers without grabbing at her lines and leads to demonstrate improved attention. OT Short Term Goal 2 (Week 3): Pt will demonstrate improved attention by bathing UB with no more than 4 cues to attend to task. OT Short Term Goal 3 (Week 3): Pt will don pants over feet with S and over hips with CGA.   Therapy Documentation Precautions:  Precautions Precautions: Fall Precaution Comments: trach 28%/5L; pelvic restraint in bed, PMSV with staff, very low voice quality Required Braces or Orthoses: Other Brace Other Brace: abdominal binder - for PEG Restrictions  Weight Bearing Restrictions: No    Vital Signs: Therapy Vitals Pulse Rate: 76 Resp: 16 Oxygen Therapy SpO2: 97 % O2 Device: Tracheostomy Collar O2 Flow Rate (L/min): 5 L/min FiO2 (%): 28 % Pain: Pain Assessment Pain Scale: Faces Faces Pain Scale: No hurt ADL: ADL Eating: NPO Grooming: Minimal assistance Where Assessed-Grooming: Edge of bed Upper Body Bathing: Supervision/safety Where Assessed-Upper Body Bathing:  Shower Lower Body Bathing: Contact guard Where Assessed-Lower Body Bathing: Shower Upper Body Dressing: Setup Where Assessed-Upper Body Dressing: Wheelchair Lower Body Dressing: Moderate cueing Where Assessed-Lower Body Dressing: Wheelchair Toileting:  (refused) Where Assessed-Toileting: Glass blower/designer:  (refused) Armed forces technical officer Method: Counselling psychologist: Event organiser: Environmental education officer Method: Radiographer, therapeutic: Grab bars,Transfer tub bench   Therapy/Group: Individual Therapy  [place] 05/09/2020, 1:07 PM

## 2020-05-09 NOTE — Progress Notes (Signed)
Nutrition Follow-up  DOCUMENTATION CODES:   Severe malnutrition in context of acute illness/injury  INTERVENTION:   Transition to bolus tube feeding via PEG: - Osmolite 1.2 at 0.5 cartons (119 mL) QID, increase by 0.5 carton each subsequent bolus until goal volume of 1.5 cartons (355 mL) is achieved.  -Continue ProSource TF 45 mL BID -Continue free water flushes of 200 mL q 6 hours   Tube feeding regimen provides 1784 kcal, 101 grams of protein, and 1164 mL of H2O.   Total free water with flushes: 1964 mL.  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  Ongoing.  GOAL:   Patient will meet greater than or equal to 90% of their needs  Met with tube feed.   MONITOR:   Diet advancement,Labs,Weight trends,TF tolerance,I & O's  REASON FOR ASSESSMENT:   Consult Enteral/tube feeding initiation and management  ASSESSMENT:   65 year old female with unremarkable PMH. Pt presented on 01/30/20 with headache and obtundation requiring intubation for airway protection. 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 Pt underwent right frontal ventriculostomy on 02/26/20. Pt then underwent diagnostic cerebral angiogram for further delineating the left PICA aneurysm which was noted not to be amenable to endovascular treatment so pt underwent suboccipital craniotomy for clipping of PICA aneurysm 01/31/20. Pt required  tracheostomy as well as thyroidectomy on 02/08/20. Pt remains NPO with PEG in place. Admitted to CIR on 04/22/20.  Pt will transition from continuous to bolus tube feeds this evening for dinner. RD will monitor pt's tube feed tolerance.   Pt remains NPO and continues to work with SLP towards diet advancement.   Pt's last recorded  weight is 01/25 which was 130.#. RD re-weighed pt on 02/09 and pt's weight was 130.9#. This indicates no significant weight changes.   I&O's Reviewed.   Meds Reviewed: Robinul (1 mg, BID), ProSource TF (45 mL, BID), free water (200 mL, TID), Osmolite 1.2 at 24m/hr for 20 hrs.   Labs Reviewed: Glucose (120 mg/dL)  Diet Order:   Diet Order            Diet NPO time specified  Diet effective now                 EDUCATION NEEDS:   No education needs have been identified at this time  Skin:  Skin Assessment: Skin Integrity Issues: Skin Integrity Issues:: Incisions Incisions: abdomen  Last BM:  02/05  Height:   Ht Readings from Last 1 Encounters:  04/22/20 5' 9"  (1.753 m)    Weight:   Wt Readings from Last 1 Encounters:  04/22/20 59.3 kg    Ideal Body Weight:  68.2 kg  BMI:  Body mass index is 19.31 kg/m.  Estimated Nutritional Needs:   Kcal:  [address]3 Protein:  90-105 grams  Fluid:  1.7-1.9 L   [place] Dietetic Intern 05/09/2020 2:30 PM

## 2020-05-09 NOTE — Progress Notes (Signed)
Occupational Therapy Session Note  Patient Details  Name: Stacy Moran MRN: [id number] Date of Birth: May 14, 1955  Today's Date: 05/09/2020 OT Individual Time: 1400-1500 OT Individual Time Calculation (min): 60 min    Short Term Goals: Week 3:  OT Short Term Goal 1 (Week 3): Pt will complete stand pivot transfers without grabbing at her lines and leads to demonstrate improved attention. OT Short Term Goal 2 (Week 3): Pt will demonstrate improved attention by bathing UB with no more than 4 cues to attend to task. OT Short Term Goal 3 (Week 3): Pt will don pants over feet with S and over hips with CGA.  Skilled Therapeutic Interventions/Progress Updates:    Pt sitting up in w/c, attempting to wrap blanket around her head, requiring redirection from OT.  Pt asked if she needed to use toilet and pt shaking head "no".  Pt participated in tabletop activity to promote improved sustained,focused, and alternating attention, following one step direction, and simple problem solving skills.  Pt required max step by step cues throughout however was able to maintain attention to each task including card matching (provided one card at a time with 9 available cards to match against), category food separation, and connect four game. Pt also required significant reduction in external distraction to promote successful attention skills.   Pt completed stand pivot w/c to commode with min assist.  OT noted pt had soiled brief of urine therefore max assist needed to change into clean brief.  Pt able to pull pants up/down over hips with min assist and pericare with CGA.  Stand pivot back to w/c with min assist.  Pts SPO2 remained between 98-100% throughout session on 28% humidified air/5L O2 via trach collar.  Call bell in reach, pelvic restraint and safety belt donned.    Therapy Documentation Precautions:  Precautions Precautions: Fall Precaution Comments: trach 28%/5L; pelvic restraint in bed, PMSV with staff, very  low voice quality Required Braces or Orthoses: Other Brace Other Brace: abdominal binder - for PEG Restrictions Weight Bearing Restrictions: No   Therapy/Group: Individual Therapy  Ezekiel Slocumb 05/09/2020, 4:00 PM

## 2020-05-10 DIAGNOSIS — K5901 Slow transit constipation: Secondary | ICD-10-CM

## 2020-05-10 DIAGNOSIS — E43 Unspecified severe protein-calorie malnutrition: Secondary | ICD-10-CM

## 2020-05-10 LAB — GLUCOSE, CAPILLARY
Glucose-Capillary: 116 mg/dL — ABNORMAL HIGH (ref 70–99)
Glucose-Capillary: 117 mg/dL — ABNORMAL HIGH (ref 70–99)
Glucose-Capillary: 124 mg/dL — ABNORMAL HIGH (ref 70–99)
Glucose-Capillary: 191 mg/dL — ABNORMAL HIGH (ref 70–99)
Glucose-Capillary: 85 mg/dL (ref 70–99)
Glucose-Capillary: 86 mg/dL (ref 70–99)
Glucose-Capillary: 99 mg/dL (ref 70–99)

## 2020-05-10 MED ORDER — POLYETHYLENE GLYCOL 3350 17 G PO PACK
17.0000 g | PACK | Freq: Every day | ORAL | Status: DC | PRN
  Administered 2020-05-14 – 2020-05-21 (×2): 17 g
  Filled 2020-05-10 (×2): qty 1

## 2020-05-10 MED ORDER — BISACODYL 10 MG RE SUPP
10.0000 mg | Freq: Every day | RECTAL | Status: DC | PRN
  Administered 2020-05-15: 10 mg via RECTAL
  Filled 2020-05-10: qty 1

## 2020-05-10 MED ORDER — SORBITOL 70 % SOLN
30.0000 mL | Freq: Every day | Status: DC | PRN
  Administered 2020-05-10 – 2020-05-21 (×4): 30 mL
  Filled 2020-05-10 (×4): qty 30

## 2020-05-10 MED ORDER — DOCUSATE SODIUM 50 MG/5ML PO LIQD
100.0000 mg | Freq: Two times a day (BID) | ORAL | Status: DC
  Administered 2020-05-10 – 2020-05-21 (×22): 100 mg via ORAL
  Filled 2020-05-10 (×23): qty 10

## 2020-05-10 NOTE — Progress Notes (Signed)
Occupational Therapy Session Note  Patient Details  Name: Stacy Moran MRN: [id number] Date of Birth: Oct 26, 1955  Today's Date: 05/10/2020 OT Individual Time: 6962-9528 OT Individual Time Calculation (min): 45 min    Short Term Goals: Week 1:  OT Short Term Goal 1 (Week 1): Pt will sit EOB with no LOB during 1 ADL task OT Short Term Goal 1 - Progress (Week 1): Met OT Short Term Goal 2 (Week 1): Pt will complete bathroom transfer with min HHA OT Short Term Goal 2 - Progress (Week 1): Progressing toward goal OT Short Term Goal 3 (Week 1): Pt will complete LB dressing with min A OT Short Term Goal 3 - Progress (Week 1): Progressing toward goal OT Short Term Goal 4 (Week 1): Pt will require no more than min cueing for focused attention to ADL task OT Short Term Goal 4 - Progress (Week 1): Progressing toward goal  Skilled Therapeutic Interventions/Progress Updates:    1:1. Pt, SLP and daughter present at beginning of session for observation style of family training. Pt completes sup>sitting EOB with MOD A for initiation. Pt dons pants with MOD A sit to stand to maange pts hands to not pull on G tube and OT to  pull pants up hips. Pt requries MOD A to don shirt d/t internal distractions impacting initiation and bun on head impacting threading head. Pt completes stand pivot with MIN A to w/c. Pt indicates needing to use bathroom. MOD A stand pivot as pt hand got free pushing BSC away. Pt with bowel and bladder movement on toilet. Pt daughter A with hygiene while OT stands pt with MIN A and holds hands. Pt daughter voicing feeling better about taking home. Educated on needing CLOSE 24/7 supervision d/t impulsivity and fall risk. Daughter verbalized understanding. Exited session with pt seated in WC, exit alarm on and call light in reach   Therapy Documentation Precautions:  Precautions Precautions: Fall Precaution Comments: trach 28%/5L; pelvic restraint in bed, PMSV with staff, very low voice  quality Required Braces or Orthoses: Other Brace Other Brace: abdominal binder - for PEG Restrictions Weight Bearing Restrictions: No General:   Vital Signs: Therapy Vitals Pulse Rate: 72 BP: 116/74 Pain:   ADL: ADL Eating: NPO Grooming: Minimal assistance Where Assessed-Grooming: Edge of bed Upper Body Bathing: Supervision/safety Where Assessed-Upper Body Bathing: Shower Lower Body Bathing: Contact guard Where Assessed-Lower Body Bathing: Shower Upper Body Dressing: Setup Where Assessed-Upper Body Dressing: Wheelchair Lower Body Dressing: Moderate cueing Where Assessed-Lower Body Dressing: Wheelchair Toileting:  (refused) Where Assessed-Toileting: Glass blower/designer:  (refused) Armed forces technical officer Method: Counselling psychologist: Event organiser: Environmental education officer Method: Radiographer, therapeutic: Regulatory affairs officer    Praxis   Exercises:   Other Treatments:     Therapy/Group: Individual Therapy  Tonny Branch 05/10/2020, 10:31 AM

## 2020-05-10 NOTE — Plan of Care (Signed)
  Problem: RH BOWEL ELIMINATION Goal: RH STG MANAGE BOWEL WITH ASSISTANCE Description: STG Manage Bowel with  min Assistance. Outcome: Not Progressing; constipation ; laxatives given   Problem: RH BLADDER ELIMINATION Goal: RH STG MANAGE BLADDER WITH ASSISTANCE Description: STG Manage Bladder With min Assistance Outcome: Not Progressing; incontinent at times   Problem: RH KNOWLEDGE DEFICIT Goal: RH STG INCREASE KNOWLEDGE OF DIABETES Description: Patient and daughter will be able to manage DM with medications and dietary modifications using handouts and education materials Outcome: Not Progressing; confused at times   Problem: RH KNOWLEDGE DEFICIT Goal: RH STG INCREASE KNOWLEDGE OF HYPERTENSION Description: Patient and daughter will be able to manage HTN with medications and dietary modifications using handouts and education materials Outcome: Not Progressing; confused at times   Problem: RH KNOWLEDGE DEFICIT Goal: RH STG INCREASE KNOWLEDGE OF DYSPHAGIA/FLUID INTAKE Description: Patient and daughter will be able to manage dysphagia with medications and dietary modifications using handouts and education materials Outcome: Not Progressing; confused at times   Problem: RH KNOWLEDGE DEFICIT Goal: RH STG INCREASE KNOWLEDGE OF STROKE PROPHYLAXIS Description: Patient and daughter will be able to manage secondary stroke risks with medications and dietary modifications using handouts and education materials Outcome: Not Progressing; confused at times

## 2020-05-10 NOTE — Progress Notes (Signed)
[facility] PHYSICAL MEDICINE & REHABILITATION PROGRESS NOTE  Subjective/Complaints: Pt in bed. Reports no problems. RN states she hasn't had BM since 2/5  ROS: Patient denies fever, rash, sore throat, blurred vision, nausea, vomiting, diarrhea, cough, shortness of breath or chest pain, joint or back pain, headache, or mood change.    Objective: Vital Signs: Blood pressure 114/68, pulse 68, temperature 98.6 F (37 C), resp. rate 18, height 5\' 9"  (1.753 m), weight 59.3 kg, SpO2 98 %. No results found. No results for input(s): WBC, HGB, HCT, PLT in the last 72 hours. No results for input(s): NA, K, CL, CO2, GLUCOSE, BUN, CREATININE, CALCIUM in the last 72 hours.  Intake/Output Summary (Last 24 hours) at 05/10/2020 0854 Last data filed at 05/10/2020 0830 Gross per 24 hour  Intake 0 ml  Output --  Net 0 ml        Physical Exam: BP 114/68 (BP Location: Right Arm)   Pulse 68   Temp 98.6 F (37 C)   Resp 18   Ht 5\' 9"  (1.753 m)   Wt 59.3 kg   SpO2 98%   BMI 19.31 kg/m  Constitutional: No distress . Vital signs reviewed. HENT: Normocephalic.  Atraumatic. Scalp with VPS Neck: + #6 trach, some secretions Eyes: EOMI. No discharge. Cardiovascular: RRR. Respiratory:normal effort, a few ronchi GI: Non-distended.  BS +.  PEG site clean Skin: Warm and dry.  Intact. Psych: cooperative. Musc: No edema in extremities.  No tenderness in extremities. Neuro: Alert Motor: 4+-5/5 throughout, stable  Assessment/Plan: 1. Functional deficits which require 3+ hours per day of interdisciplinary therapy in a comprehensive inpatient rehab setting.  Physiatrist is providing close team supervision and 24 hour management of active medical problems listed below.  Physiatrist and rehab team continue to assess barriers to discharge/monitor patient progress toward functional and medical goals   Care Tool:  Bathing    Body parts bathed by patient: Right arm,Left arm,Chest,Abdomen,Right upper  leg,Left upper leg,Right lower leg,Left lower leg,Face,Front perineal area,Buttocks   Body parts bathed by helper: Front perineal area,Buttocks     Bathing assist Assist Level: Minimal Assistance - Patient > 75%     Upper Body Dressing/Undressing Upper body dressing   What is the patient wearing?: Pull over shirt    Upper body assist Assist Level: Minimal Assistance - Patient > 75%    Lower Body Dressing/Undressing Lower body dressing      What is the patient wearing?: Incontinence brief,Pants     Lower body assist Assist for lower body dressing: Moderate Assistance - Patient 50 - 74%     Toileting Toileting Toileting Activity did not occur Landscape architect and hygiene only): Refused  Toileting assist Assist for toileting: Moderate Assistance - Patient 50 - 74%     Transfers Chair/bed transfer  Transfers assist     Chair/bed transfer assist level: Minimal Assistance - Patient > 75%     Locomotion Ambulation   Ambulation assist      Assist level: 2 helpers Assistive device: Hand held assist Max distance: 28ft   Walk 10 feet activity   Assist  Walk 10 feet activity did not occur: Refused  Assist level: 2 helpers Assistive device: Hand held assist   Walk 50 feet activity   Assist Walk 50 feet with 2 turns activity did not occur: Refused  Assist level: 2 helpers Assistive device: Hand held assist    Walk 150 feet activity   Assist Walk 150 feet activity did not occur: Refused  Assist level: 2 helpers Assistive device: Hand held assist    Walk 10 feet on uneven surface  activity   Assist Walk 10 feet on uneven surfaces activity did not occur: Safety/medical concerns         Wheelchair     Assist Will patient use wheelchair at discharge?: Yes Type of Wheelchair: Manual Wheelchair activity did not occur: Refused         Wheelchair 50 feet with 2 turns activity    Assist            Wheelchair 150 feet  activity     Assist           Medical Problem List and Plan: 1. 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  Continue CIR 2.  Antithrombotics: -DVT/anticoagulation: Subcutaneous heparin             -antiplatelet therapy: N/A 3. Pain Management: Tylenol as needed 4. Mood: Provide emotional support             -antipsychotic agents: Seroquel 25 mg twice daily   Agitation improved             -pt is fall risk 5. Neuropsych: This patient is not capable of making decisions on her own behalf.  Ritalin twice daily started on 2/2 with improvement, increased on 2/9 monitor for improvement 6. Skin/Wound Care: Routine skin checks 7. Fluids/Electrolytes/Nutrition: Routine in and outs  BMP within acceptable range on 2/7, labs ordered for Monday 8. Seizure prophylaxis.   Keppra decreased to 500 BID on 1/28, decreased further on 2/2, DC'd on 2/7 9. 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 Constance Holster.   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 Earleen Newport.       Dietary and speech therapy follow-up  Changed tube feeds to bolus feeds-discussed with dietary.  Advanced diet as tolerated 11. Hyperglycemia related to tube feeds. SSI   CBG (last 3)  Recent Labs    05/10/20 0016 05/10/20 0514 05/10/20 0814  GLUCAP 124* 85 86   Slightly elevated on 2/11, monitor in accordance with tube feed changes 12. Hypothyroidism: Syndthroid 49. Hypertension. Hydralazine 50 mg every 8 hours, Normodyne 300 mg 3  times daily, Norvasc 10 mg daily   Vitals:   05/09/20 2100 05/10/20 0517  BP:  114/68  Pulse:  68  Resp:  18  Temp:  98.6 F (37 C)  SpO2: 100% 98%   Relatively controlled for the most part on 2/11  Monitor with increased mobility 13.  Acute blood loss anemia  Hemoglobin 11.9 on 2/7, labs ordered for Monday  Continue to monitor 14. Slow transit constipation: no bm reported since 2/5  -sorbitol today and qd prn  -add colace bid scheduled  -prn dulcolax suppository  LOS: 18 days A FACE TO FACE EVALUATION WAS PERFORMED  Meredith Staggers 05/10/2020, 8:54 AM

## 2020-05-10 NOTE — Progress Notes (Signed)
Physical Therapy Session Note  Patient Details  Name: Stacy Moran MRN: [id number] Date of Birth: 11/25/1955  Today's Date: 05/10/2020 PT Individual Time: 1116-1200 PT Individual Time Calculation (min): 44 min   Short Term Goals: Week 3:  PT Short Term Goal 1 (Week 3): Pt will complete bed mobility with supervision PT Short Term Goal 2 (Week 3): Pt will consistently perform bed<>chair transfers with CGA and LRAD PT Short Term Goal 3 (Week 3): Pt will ambulate 174ft with minA of 1 person and LRAD PT Short Term Goal 4 (Week 3): Pt will initiate stair training PT Short Term Goal 5 (Week 3): Pt will consistently demonstrate improved safety awareness and decreased impulsivity with functional mobility tasks  Skilled Therapeutic Interventions/Progress Updates:    Patient received sitting up in wc, dtr at bedside for family ed, agreeable to PT. She denies pain. PT discussing with daughter patients current level of assist for functional mobility including bed mobility, transfers, sit <>stand and gait. Explained to dtr patients degree of impulsivity, decreased safety awareness and how patient can pull at lines at times. Dtr verbalizing understanding. PT explaining that patient would benefit from something safe to fidget with that may deter her from pulling at trach/PEG when home if d/cing with these lines. Dtr confirming 2 STE with B HR, which patient can reach both. Multistory house, but patient able to remain on main floor with access to bedroom and 1/2 bath only. Trach adapter not present in room- RN called RT to obtain new adapter. Session limited by this by how far she could walk. Patient able to come to stand with CGA and verbal cues for safety. Explaining to dtr how to guard patient when standing/ambulating. Mentioned that it would be best for patient to walk household distances with assist at all times when home and how patient has tendency to begin to sit unexpectedly at times. Patient able to  ambulate 13ft forward/bacward (as long as trach tubing would allow) with CGA. Patient then sitting abruptly back into chair and placing her leg rests back into place. Dtr with many nursing-related questions (meds, tube feeds, suctions, etc.) PT directing dtr to RN to answer these questions. Patient remaining up in wc, pelvic restraint on with seatbelt alarm on, call light within reach, dtr at bedside.   Therapy Documentation Precautions:  Precautions Precautions: Fall Precaution Comments: trach 28%/5L; pelvic restraint in bed, PMSV with staff, very low voice quality Required Braces or Orthoses: Other Brace Other Brace: abdominal binder - for PEG Restrictions Weight Bearing Restrictions: No    Therapy/Group: Individual Therapy  Karoline Caldwell, PT, DPT, CBIS  05/10/2020, 7:37 AM

## 2020-05-10 NOTE — Progress Notes (Addendum)
Speech Language Pathology Daily Session Note  Patient Details  Name: Stacy Moran MRN: [id number] Date of Birth: 1955-09-10  Today's Date: 05/10/2020 SLP Individual Time: 1000-1030 SLP Individual Time Calculation (min): 30 min  Short Term Goals: Week 1: SLP Short Term Goal 1 (Week 1): Pt will demonstrate focused attention in 1 minute intervals with max A multimodal cues in 50% of opportunties. SLP Short Term Goal 1 - Progress (Week 1): Met SLP Short Term Goal 2 (Week 1): Pt will follow 1 step commands with max A multimodal cues in 50% of opportunties. SLP Short Term Goal 2 - Progress (Week 1): Met SLP Short Term Goal 3 (Week 1): Pt will respond to yes/no questions with 80% accuarcy in 50% of opportunties with max A multimodal cues. SLP Short Term Goal 3 - Progress (Week 1): Met SLP Short Term Goal 4 (Week 1): Pt will communicate wants/needs at word level (mouthing word or written) with max A multimodal cues in 50% of opportunties. SLP Short Term Goal 4 - Progress (Week 1): Met SLP Short Term Goal 5 (Week 1): Pt will tolerate PMSV for 30 minutes without s/s of distress. SLP Short Term Goal 5 - Progress (Week 1): Not met SLP Short Term Goal 6 (Week 1): Pt will consume trials of ice chips with moderate s/s aspiration noted. SLP Short Term Goal 6 - Progress (Week 1): Not met  Skilled Therapeutic Interventions:   Patient seen for skilled ST session with daughter present for education regarding patient's current status and progress. SLP informed daughter that PEG tube for feeding and trach will likely continue past CIR stay. SLP discussed that patient has appeared with some improvement in secretion management, but she continues to demonstrate very poor attention and participation in therapy overall has been poor as well. SLP will speak further with patient's therapy team and determine if any benefit from repeat MBS prior to discharge, plans for trach downsizing?, etc. Patient continues to benefit  from skilled SLP intervention to maximize cognitive-linguistic, speech and swallow function prior to discharge.  Pain Pain Assessment Pain Scale: Faces Faces Pain Scale: No hurt  Therapy/Group: Individual Therapy  Sonia Baller, MA, CCC-SLP Speech Therapy

## 2020-05-10 NOTE — Progress Notes (Signed)
Patient appeared to be contracted and shaking, with left facial droop. Left side appeared weaker upon assessment. Charge and rapid were called. Upon assessment with rapid and charge patient returned to baseline. On call Dr. Naaman Plummer was called and notified. Will continue every 2hr neuro check per Dr. Naaman Plummer. Patient in bed with waist restraint and call bell in reach.

## 2020-05-11 ENCOUNTER — Inpatient Hospital Stay (HOSPITAL_COMMUNITY): Payer: Self-pay

## 2020-05-11 LAB — BASIC METABOLIC PANEL
Anion gap: 8 (ref 5–15)
BUN: 26 mg/dL — ABNORMAL HIGH (ref 8–23)
CO2: 28 mmol/L (ref 22–32)
Calcium: 9.9 mg/dL (ref 8.9–10.3)
Chloride: 103 mmol/L (ref 98–111)
Creatinine, Ser: 0.6 mg/dL (ref 0.44–1.00)
GFR, Estimated: >60 mL/min (ref >60)
Glucose, Bld: 103 mg/dL — ABNORMAL HIGH (ref 70–99)
Potassium: 4.3 mmol/L (ref 3.5–5.1)
Sodium: 139 mmol/L (ref 135–145)

## 2020-05-11 LAB — URINALYSIS, COMPLETE (UACMP) WITH MICROSCOPIC
Bilirubin Urine: NEGATIVE
Glucose, UA: NEGATIVE mg/dL
Hgb urine dipstick: NEGATIVE
Ketones, ur: NEGATIVE mg/dL
Leukocytes,Ua: NEGATIVE
Nitrite: NEGATIVE
Protein, ur: NEGATIVE mg/dL
RBC / HPF: NONE SEEN RBC/hpf (ref 0–5)
Specific Gravity, Urine: 1.015 (ref 1.005–1.030)
Squamous Epithelial / HPF: NONE SEEN (ref 0–5)
WBC, UA: NONE SEEN WBC/hpf (ref 0–5)
pH: 7 (ref 5.0–8.0)

## 2020-05-11 LAB — CBC WITH DIFFERENTIAL/PLATELET
Abs Immature Granulocytes: 0.02 10*3/uL (ref 0.00–0.07)
Basophils Absolute: 0 10*3/uL (ref 0.0–0.1)
Basophils Relative: 0 %
Eosinophils Absolute: 0.1 10*3/uL (ref 0.0–0.5)
Eosinophils Relative: 1 %
HCT: 40.3 % (ref 36.0–46.0)
Hemoglobin: 12.4 g/dL (ref 12.0–15.0)
Immature Granulocytes: 0 %
Lymphocytes Relative: 24 %
Lymphs Abs: 2 10*3/uL (ref 0.7–4.0)
MCH: 27 pg (ref 26.0–34.0)
MCHC: 30.8 g/dL (ref 30.0–36.0)
MCV: 87.6 fL (ref 80.0–100.0)
Monocytes Absolute: 0.5 10*3/uL (ref 0.1–1.0)
Monocytes Relative: 6 %
Neutro Abs: 5.4 10*3/uL (ref 1.7–7.7)
Neutrophils Relative %: 69 %
Platelets: 126 10*3/uL — ABNORMAL LOW (ref 150–400)
RBC: 4.6 MIL/uL (ref 3.87–5.11)
RDW: 14.6 % (ref 11.5–15.5)
WBC: 8 10*3/uL (ref 4.0–10.5)
nRBC: 0 % (ref 0.0–0.2)

## 2020-05-11 LAB — GLUCOSE, CAPILLARY
Glucose-Capillary: 119 mg/dL — ABNORMAL HIGH (ref 70–99)
Glucose-Capillary: 164 mg/dL — ABNORMAL HIGH (ref 70–99)
Glucose-Capillary: 173 mg/dL — ABNORMAL HIGH (ref 70–99)
Glucose-Capillary: 65 mg/dL — ABNORMAL LOW (ref 70–99)
Glucose-Capillary: 81 mg/dL (ref 70–99)
Glucose-Capillary: 90 mg/dL (ref 70–99)
Glucose-Capillary: 92 mg/dL (ref 70–99)

## 2020-05-11 IMAGING — CR DG CHEST 2V
2 series · 2 of 2 positions shown · non-contrast
Comparison: [DATE]

CLINICAL DATA: Fever

EXAM:
CHEST - 2 VIEW

[chest ap]
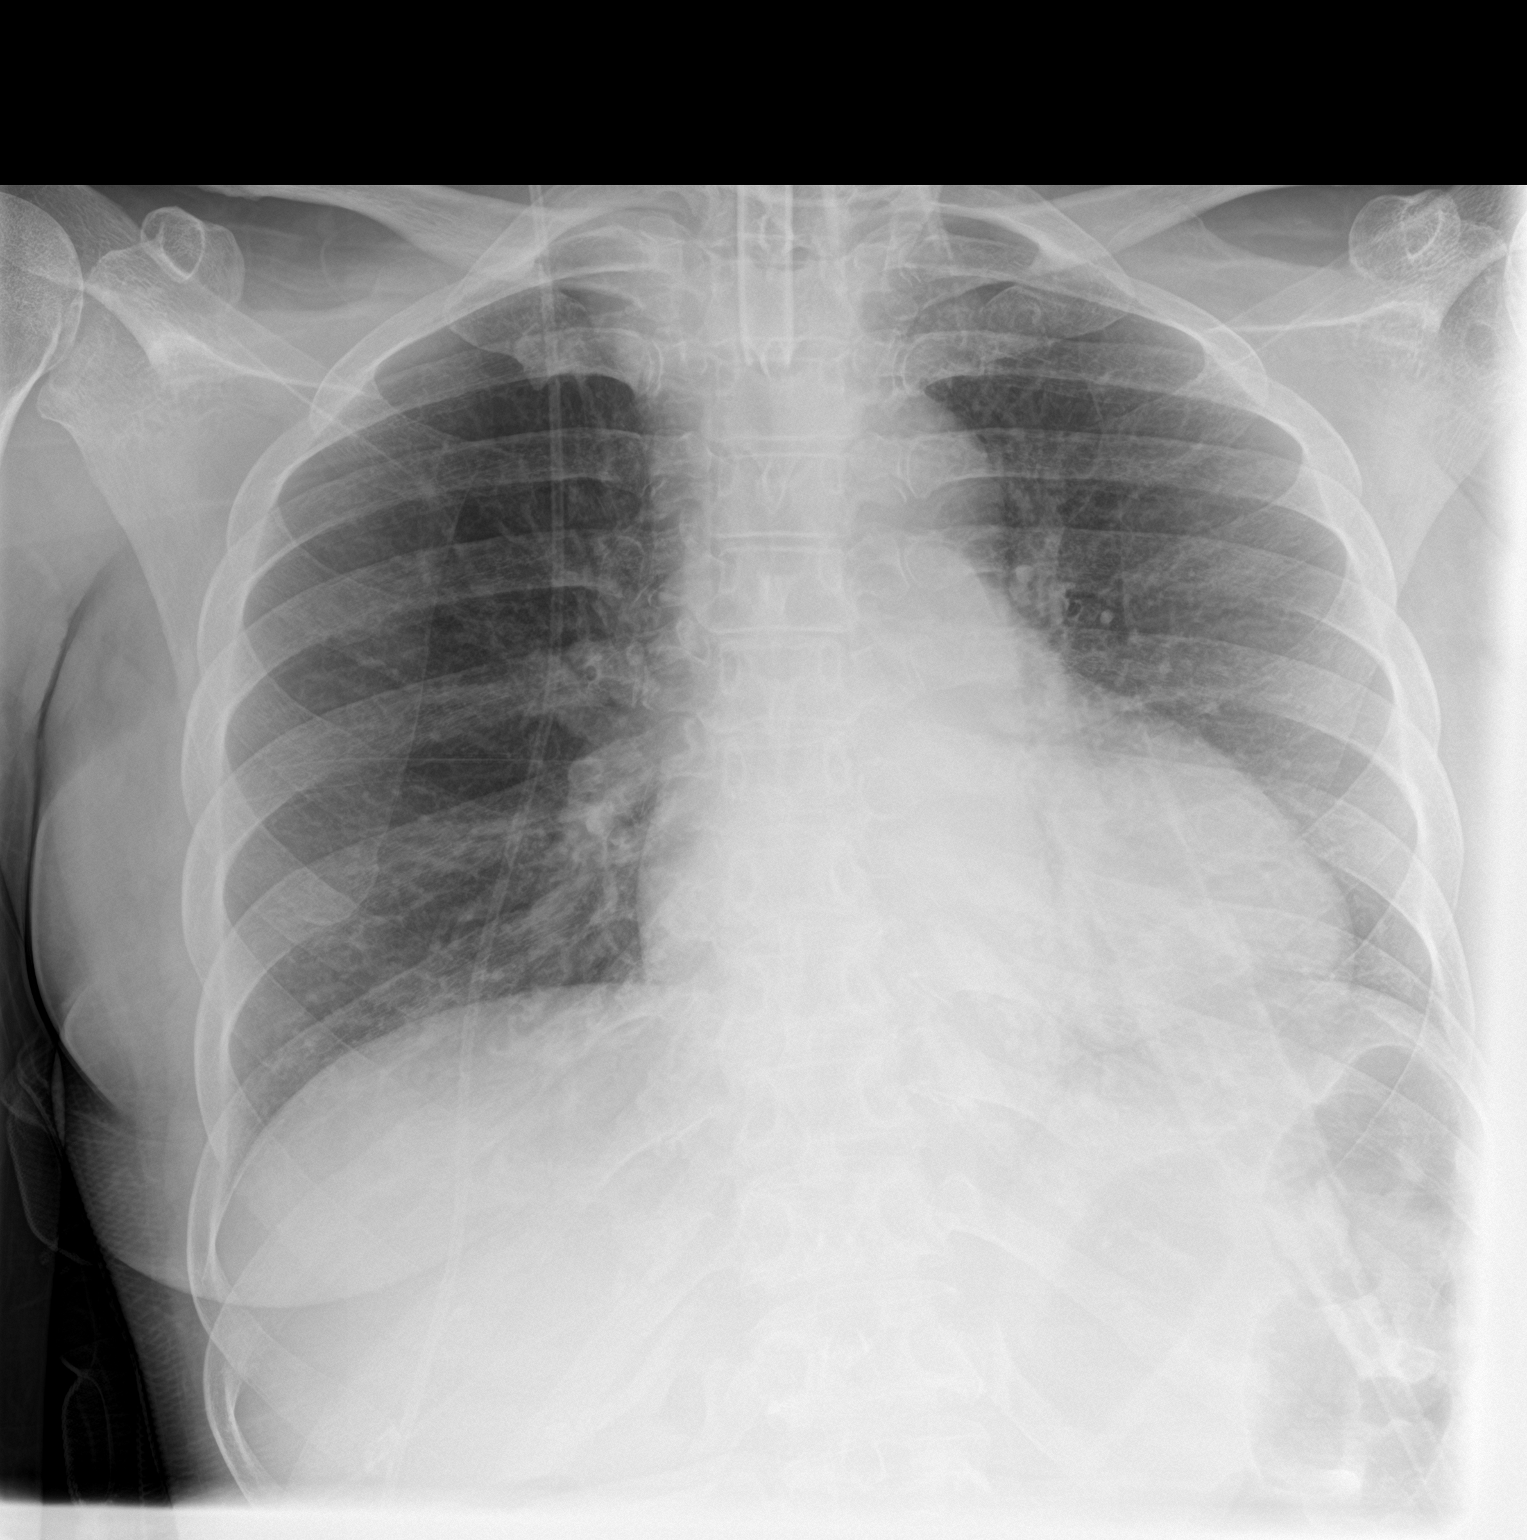

[chest lat]
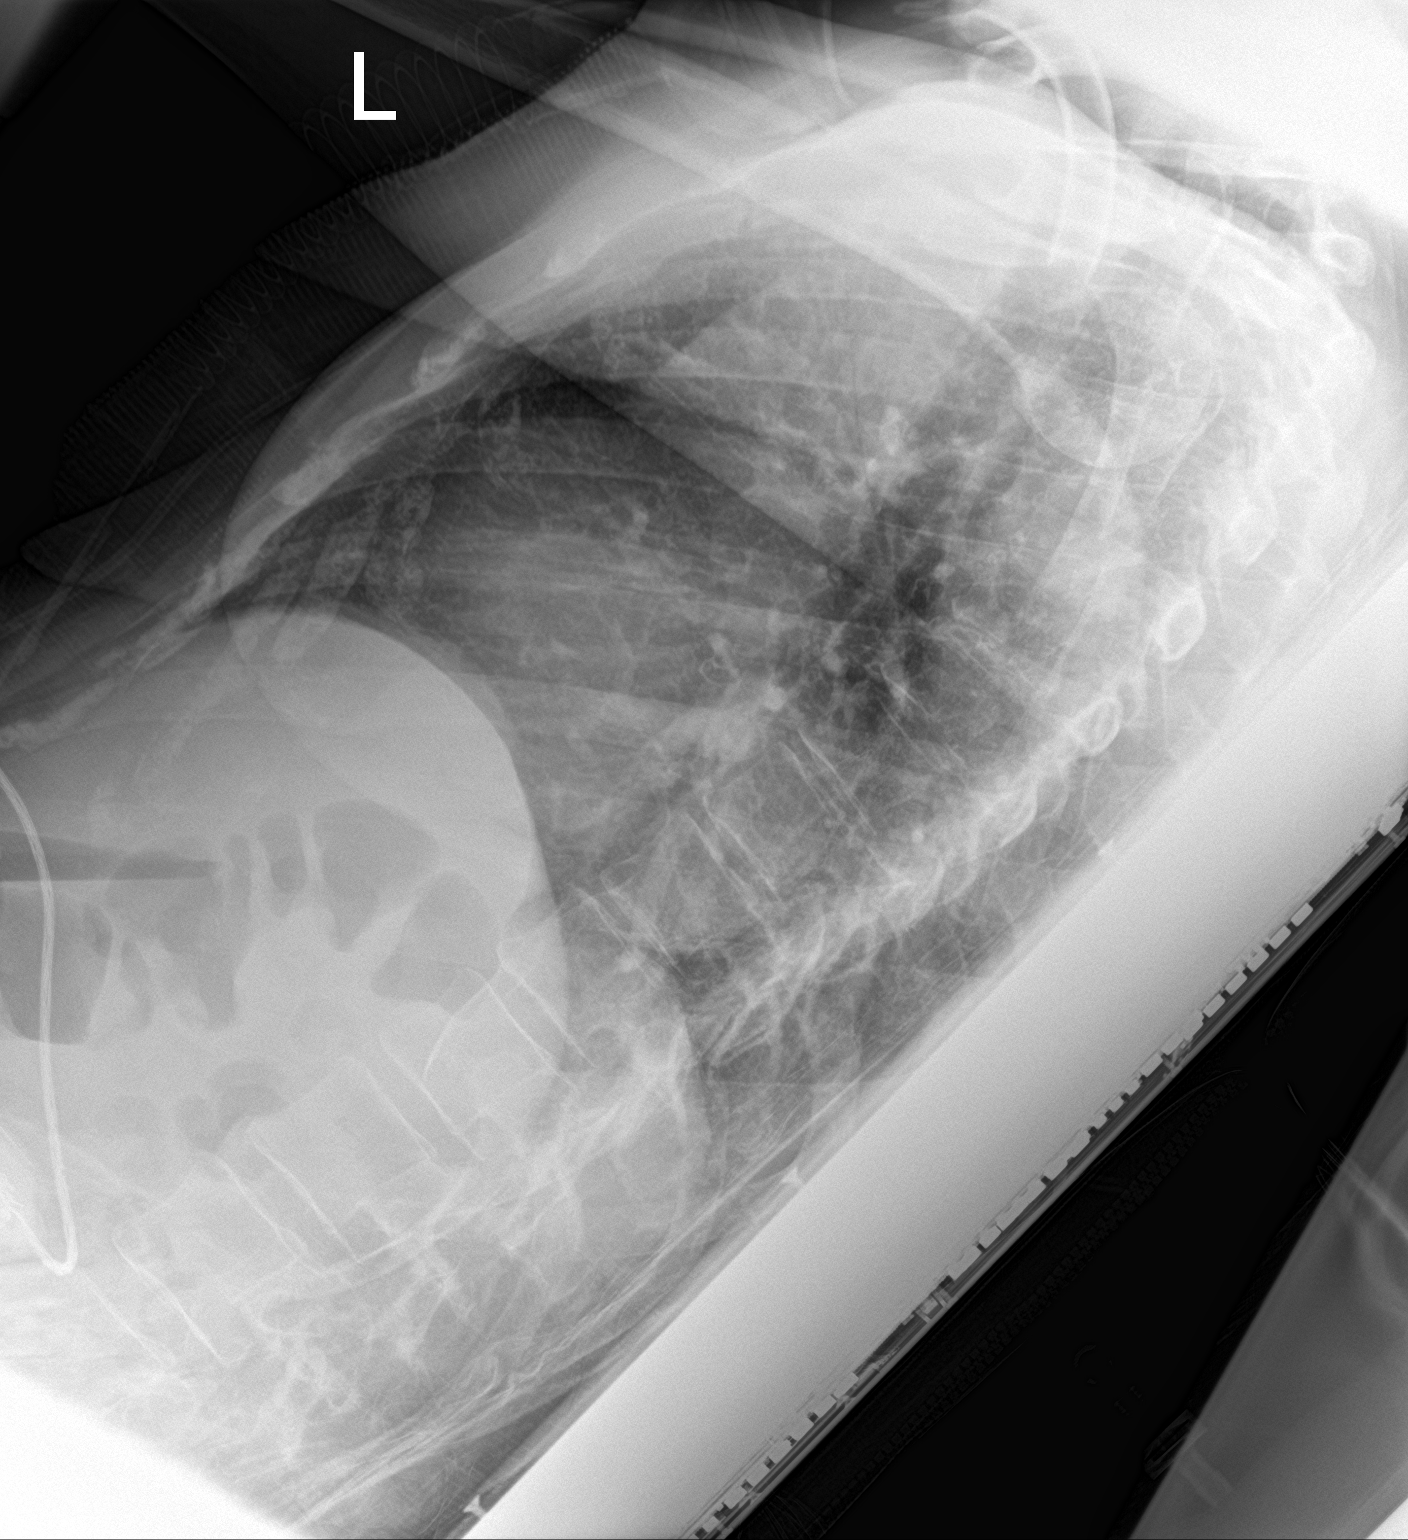

[2 of 2 positions shown; findings below may reference images not displayed]

FINDINGS: Cardiomegaly. Tracheostomy. Both lungs are clear. The visualized
skeletal structures are unremarkable.
IMPRESSION: 1. Cardiomegaly without acute abnormality of the lungs. No airspace
opacity.

2.  Tracheostomy.

## 2020-05-11 NOTE — Plan of Care (Signed)
  Problem: RH SAFETY Goal: RH STG ADHERE TO SAFETY PRECAUTIONS W/ASSISTANCE/DEVICE Description: STG Adhere to Safety Precautions With  cues/reminders Assistance/Device. Outcome: Not Progressing; restraints

## 2020-05-11 NOTE — Progress Notes (Signed)
Hypoglycemic Event  CBG: 65  Treatment: 1/2 dose of 0800 osmolite 1.2 given   Symptoms: asymptomatic  Follow-up CBG: Time: 6815 CBG Result: 90  Possible Reasons for Event: 0025 insulin coverage  Comments/MD notified: protocol followed    Chilton Si

## 2020-05-11 NOTE — Progress Notes (Signed)
[facility] PHYSICAL MEDICINE & REHABILITATION PROGRESS NOTE  Subjective/Complaints: LPN contacted me around 9pm that she found patient curled up and shaking in bed. RRN saw patient and she returned to baseline. No other issues the rest of the night. Pt appeared comfortable, resting when I entered this morning. She denied pain.   ROS: no CP, new SOB, cough, NVD   Objective: Vital Signs: Blood pressure 130/87, pulse 80, temperature 99.1 F (37.3 C), resp. rate 18, height 5\' 9"  (1.753 m), weight 59.3 kg, SpO2 100 %. No results found. No results for input(s): WBC, HGB, HCT, PLT in the last 72 hours. No results for input(s): NA, K, CL, CO2, GLUCOSE, BUN, CREATININE, CALCIUM in the last 72 hours.  Intake/Output Summary (Last 24 hours) at 05/11/2020 0800 Last data filed at 05/10/2020 2008 Gross per 24 hour  Intake 0 ml  Output 275 ml  Net -275 ml        Physical Exam: BP 130/87 (BP Location: Left Arm)   Pulse 80   Temp 99.1 F (37.3 C)   Resp 18   Ht 5\' 9"  (1.753 m)   Wt 59.3 kg   SpO2 100%   BMI 19.31 kg/m  Constitutional: No distress . Vital signs reviewed. HEENT: EOMI, oral membranes moist, VPS Neck: #6 trach, no obvious secretions Cardiovascular: RRR without murmur. No JVD    Respiratory/Chest: CTA Bilaterally without wheezes or rales. Normal effort    GI/Abdomen: BS +, non-tender, non-distended, PEG site clean Ext: no clubbing, cyanosis, or edema Psych: flat Skin: Warm and dry.  Intact. Psych: cooperative. Musc: No edema in extremities.  No tenderness in extremities. Neuro: Awakens. Follows basic commands.  Motor: 4+-5/5 throughout, stable  Assessment/Plan: 1. Functional deficits which require 3+ hours per day of interdisciplinary therapy in a comprehensive inpatient rehab setting.  Physiatrist is providing close team supervision and 24 hour management of active medical problems listed below.  Physiatrist and rehab team continue to assess barriers to  discharge/monitor patient progress toward functional and medical goals   Care Tool:  Bathing    Body parts bathed by patient: Right arm,Left arm,Chest,Abdomen,Right upper leg,Left upper leg,Right lower leg,Left lower leg,Face,Front perineal area,Buttocks   Body parts bathed by helper: Front perineal area,Buttocks     Bathing assist Assist Level: Minimal Assistance - Patient > 75%     Upper Body Dressing/Undressing Upper body dressing   What is the patient wearing?: Pull over shirt    Upper body assist Assist Level: Minimal Assistance - Patient > 75%    Lower Body Dressing/Undressing Lower body dressing      What is the patient wearing?: Incontinence brief,Pants     Lower body assist Assist for lower body dressing: Moderate Assistance - Patient 50 - 74%     Toileting Toileting Toileting Activity did not occur Landscape architect and hygiene only): Refused  Toileting assist Assist for toileting: Moderate Assistance - Patient 50 - 74%     Transfers Chair/bed transfer  Transfers assist     Chair/bed transfer assist level: Minimal Assistance - Patient > 75%     Locomotion Ambulation   Ambulation assist      Assist level: Contact Guard/Touching assist Assistive device: Hand held assist Max distance: 6   Walk 10 feet activity   Assist  Walk 10 feet activity did not occur: Refused  Assist level: 2 helpers Assistive device: Hand held assist   Walk 50 feet activity   Assist Walk 50 feet with 2 turns activity did not  occur: Refused  Assist level: 2 helpers Assistive device: Hand held assist    Walk 150 feet activity   Assist Walk 150 feet activity did not occur: Refused  Assist level: 2 helpers Assistive device: Hand held assist    Walk 10 feet on uneven surface  activity   Assist Walk 10 feet on uneven surfaces activity did not occur: Safety/medical concerns         Wheelchair     Assist Will patient use wheelchair at  discharge?: Yes Type of Wheelchair: Manual Wheelchair activity did not occur: Refused         Wheelchair 50 feet with 2 turns activity    Assist            Wheelchair 150 feet activity     Assist          BP 130/87 (BP Location: Left Arm)   Pulse 80   Temp 99.1 F (37.3 C)   Resp 18   Ht 5\' 9"  (1.753 m)   Wt 59.3 kg   SpO2 100%   BMI 19.31 kg/m   Medical Problem List and Plan: 1. 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  Continue CIR PT, OT, SLP  2/13 -pt with "episode" last night of unclear etiology. Was not post-ictal as she returned to baseline quickly after. Continue to monitor. Does have a low grade temp this morning. Will check CBC and UA/UCX, CXR See #8 2.  Antithrombotics: -DVT/anticoagulation: Subcutaneous heparin             -antiplatelet therapy: N/A 3. Pain Management: Tylenol as needed 4. Mood: Provide emotional support             -antipsychotic agents: Seroquel 25 mg twice daily   Agitation improved             -pt is fall risk 5. Neuropsych: This patient is not capable of making decisions on her own behalf.  Ritalin twice daily started on 2/2 with improvement, increased on 2/9 monitor for improvement 6. Skin/Wound Care: Routine skin checks 7. Fluids/Electrolytes/Nutrition: Routine in and outs  BMP within acceptable range on 2/7, will check labs today 8. Seizure prophylaxis.   Keppra decreased to 500 BID on 1/28, decreased further on 2/2, DC'd on 2/7  2/13 don't think episode last night was a seizure--continue to monitor 9. 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 Constance Holster.   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 Earleen Newport.       Dietary and speech therapy follow-up  Changed tube feeds to bolus feeds-discussed with dietary.  Advanced diet as tolerated 11. Hyperglycemia related to tube feeds. SSI   CBG (last 3)  Recent Labs    05/11/20 0402 05/11/20 0437 05/11/20 0737  GLUCAP 65* 90 92   cbg's tightly controlled 12. Hypothyroidism: Syndthroid 53. Hypertension. Hydralazine 50 mg every 8 hours, Normodyne 300 mg 3 times daily, Norvasc 10 mg daily   Vitals:   05/11/20 0256 05/11/20 0523  BP:  130/87  Pulse: 84 80  Resp: 18 18  Temp:  99.1 F (  37.3 C)  SpO2: 95% 100%   Relatively controlled for the most part on 2/13  Monitor with increased mobility 13.  Acute blood loss anemia  Hemoglobin 11.9 on 2/7, labs ordered for Monday  Continue to monitor 14. Slow transit constipation: no bm reported since 2/5  -added colace bid scheduled  -prn dulcolax suppository  -2/13 had bms after sorbitol yesterday  LOS: 19 days A FACE TO FACE EVALUATION WAS PERFORMED  Meredith Staggers 05/11/2020, 8:00 AM

## 2020-05-11 NOTE — Progress Notes (Signed)
Occupational Therapy Session Note  Patient Details  Name: Stacy Moran MRN: [id number] Date of Birth: 12-13-1955  Today's Date: 05/11/2020 OT Individual Time: 1300-1330 OT Individual Time Calculation (min): 30 min  and Today's Date: 05/11/2020 OT Missed Time: 30 Minutes Missed Time Reason: Patient unwilling/refused to participate without medical reason;Patient fatigue   Short Term Goals: Week 3:  OT Short Term Goal 1 (Week 3): Pt will complete stand pivot transfers without grabbing at her lines and leads to demonstrate improved attention. OT Short Term Goal 2 (Week 3): Pt will demonstrate improved attention by bathing UB with no more than 4 cues to attend to task. OT Short Term Goal 3 (Week 3): Pt will don pants over feet with S and over hips with CGA.    Skilled Therapeutic Interventions/Progress Updates:    Pt received supine sleeping, easily awoken to vc and nodding yes to ADLs OOB. Pt completed bed mobility to EOB with min A. She stood from EOB following redirection to task, pt internally distracted throughout session, requiring mod cueing for attention to task. She completed ambulatory transfer to her w/c with min A, poor sequencing upon approach of chair and requiring mod cueing. Pt was taken to the sink to complete ADLs. She initiated washing her face with mod cueing and set up assist. Unclear if OT upset pt or if nursing administering heparin injection upset her/was painful but pt began ignoring OT. She would not look at OT, not follow any directions/commands and did several deep breaths as if she were upset. Attempted to redirect to task/provide emotional support but pt continued to ignore OT. She required several cues and several minutes to even return to bed. She snatched gown from OT and put it over her lap, refusing to don it. Once she initiated transfer back to bed, she was able to do so with min A. She returned to sidelying quickly and closed her eyes. 28% FiO2 was administered via  trach and continuous pulse ox re-connected. Pt was left supine, waist restraint on and bed alarm set. 30 min missed.   Therapy Documentation Precautions:  Precautions Precautions: Fall Precaution Comments: trach 28%/5L; pelvic restraint in bed, PMSV with staff, very low voice quality Required Braces or Orthoses: Other Brace Other Brace: abdominal binder - for PEG Restrictions Weight Bearing Restrictions: No   Therapy/Group: Individual Therapy  Curtis Sites 05/11/2020, 6:39 AM

## 2020-05-12 DIAGNOSIS — R451 Restlessness and agitation: Secondary | ICD-10-CM

## 2020-05-12 LAB — GLUCOSE, CAPILLARY
Glucose-Capillary: 104 mg/dL — ABNORMAL HIGH (ref 70–99)
Glucose-Capillary: 108 mg/dL — ABNORMAL HIGH (ref 70–99)
Glucose-Capillary: 110 mg/dL — ABNORMAL HIGH (ref 70–99)
Glucose-Capillary: 125 mg/dL — ABNORMAL HIGH (ref 70–99)
Glucose-Capillary: 83 mg/dL (ref 70–99)
Glucose-Capillary: 95 mg/dL (ref 70–99)

## 2020-05-12 LAB — URINE CULTURE: Culture: NO GROWTH

## 2020-05-12 MED ORDER — CLONAZEPAM 0.25 MG PO TBDP
0.2500 mg | ORAL_TABLET | Freq: Two times a day (BID) | ORAL | Status: DC
  Administered 2020-05-12 – 2020-05-21 (×19): 0.25 mg via ORAL
  Filled 2020-05-12 (×3): qty 1
  Filled 2020-05-12: qty 2
  Filled 2020-05-12 (×3): qty 1
  Filled 2020-05-12: qty 2
  Filled 2020-05-12 (×2): qty 1
  Filled 2020-05-12: qty 2
  Filled 2020-05-12 (×5): qty 1
  Filled 2020-05-12: qty 2
  Filled 2020-05-12 (×3): qty 1

## 2020-05-12 MED ORDER — METHYLPHENIDATE HCL 5 MG PO TABS
2.5000 mg | ORAL_TABLET | Freq: Two times a day (BID) | ORAL | Status: DC
  Administered 2020-05-13 – 2020-05-21 (×17): 2.5 mg via ORAL
  Filled 2020-05-12 (×17): qty 1

## 2020-05-12 NOTE — Progress Notes (Signed)
Occupational Therapy Session Note  Patient Details  Name: Stacy Moran MRN: [id number] Date of Birth: 04/18/1955  Today's Date: 05/12/2020 OT Individual Time: 4765-4650 OT Individual Time Calculation (min): 23 min    Short Term Goals: Week 3:  OT Short Term Goal 1 (Week 3): Pt will complete stand pivot transfers without grabbing at her lines and leads to demonstrate improved attention. OT Short Term Goal 2 (Week 3): Pt will demonstrate improved attention by bathing UB with no more than 4 cues to attend to task. OT Short Term Goal 3 (Week 3): Pt will don pants over feet with S and over hips with CGA.  Skilled Therapeutic Interventions/Progress Updates:    Pt greeted at time of session sitting EOB with NT and RN performing toileting tasks with pt, stating pt had transferred to [facility] but refusing to allow clothing management. Stand pivot Min A back to bed for pt to calm down and attempted several more times, able to perform stand pivot with CGA/Min A but became agitated when attempting to perform clothing management with pt pulling pants up and not letting staff pull down. Note pt with some agitation trying to pinch staff and mouthing that she wanted to hit the nurse. Refused toileting, and once back on bed sit to supine CGA with cues for scooting and positioning. Trialed dynamic sitting balance activity for ball toss with pt showing good accuracy and follow through with RUE but stopped participating and making gestures at therapist and staff. Pt in upright sitting with lap belt and bilateral wrist restraints in place. Remained on O2 throughout session, agitated and required frequent multimodal cues for following commands.   Therapy Documentation Precautions:  Precautions Precautions: Fall Precaution Comments: trach 28%/5L; pelvic restraint in bed, PMSV with staff, very low voice quality Required Braces or Orthoses: Other Brace Other Brace: abdominal binder - for PEG Restrictions Weight Bearing  Restrictions: No    Therapy/Group: Individual Therapy  Viona Gilmore 05/12/2020, 4:02 PM

## 2020-05-12 NOTE — Progress Notes (Signed)
Pt with increased agitations and restlessness throughout shift. Pt also noted attempting to pull on trach and peg tube. Order obtained for bilateral soft wrist restraints. Pt is constantly fidgeting and and trying to take restraints off.  1430 Pt more agitated with toileting, unable to follow simple directions and threatening to punch writer. Pt refusing to pull pants down and sit on commode.  1745 assisted pt to [facility] with increased time and a lot of encouragement. Pt was finally able to void. Pt presents difficulty focusing on task and reaching and trying to pull on things nearby. Pt currently in bed with restraints on and in no distress.   Gerald Stabs, RN

## 2020-05-12 NOTE — Progress Notes (Signed)
Physical Therapy Session Note  Patient Details  Name: Stacy Moran MRN: [id number] Date of Birth: [birth year]  Today's Date: 05/12/2020 PT Individual Time: 6811-5726 PT Individual Time Calculation (min): 56 min   Short Term Goals: Week 3:  PT Short Term Goal 1 (Week 3): Pt will complete bed mobility with supervision PT Short Term Goal 2 (Week 3): Pt will consistently perform bed<>chair transfers with CGA and LRAD PT Short Term Goal 3 (Week 3): Pt will ambulate 170ft with minA of 1 person and LRAD PT Short Term Goal 4 (Week 3): Pt will initiate stair training PT Short Term Goal 5 (Week 3): Pt will consistently demonstrate improved safety awareness and decreased impulsivity with functional mobility tasks  Skilled Therapeutic Interventions/Progress Updates:     Pt received supine in bed with RN performing bolus tube feed. Pt does not indicate pain. Supine to sit with minA after PT removes posey belt. Pt very fidgety and impulsive, pulling at lines and attempting to stand prior to being safe to do so. PT redirects pt constantly throughout session to maintain safety. Pt indicates need to use restroom. Stand pivot transfer to [facility] with minA. Following toileting, pt performs pericare with supervision. Pt ambulates x300' with modA and +2 for line management. Pt ambulates additional bouts of x300', x100', and x60'. PT provides verbal and tactile cues for posture, weight shifting, safety awareness, and positioning. Pt becomes less impulsive with each successive bout of ambulation, but continues to require redirection due to pulling at oxygen tubing. Pt left seated in WC with posey belt intact, all needs within reach.  Therapy Documentation Precautions:  Precautions Precautions: Fall Precaution Comments: trach 28%/5L; pelvic restraint in bed, PMSV with staff, very low voice quality Required Braces or Orthoses: Other Brace Other Brace: abdominal binder - for PEG Restrictions Weight Bearing  Restrictions: No   Therapy/Group: Individual Therapy  Breck Coons, PT, DPT 05/12/2020, 12:52 PM

## 2020-05-12 NOTE — Progress Notes (Signed)
Occupational Therapy Session Note  Patient Details  Name: Stacy Moran MRN: [id number] Date of Birth: 19-Aug-1955  Today's Date: 05/12/2020 OT Individual Time: 1400-1430 OT Individual Time Calculation (min): 30 min    Short Term Goals: Week 3:  OT Short Term Goal 1 (Week 3): Pt will complete stand pivot transfers without grabbing at her lines and leads to demonstrate improved attention. OT Short Term Goal 2 (Week 3): Pt will demonstrate improved attention by bathing UB with no more than 4 cues to attend to task. OT Short Term Goal 3 (Week 3): Pt will don pants over feet with S and over hips with CGA.  Skilled Therapeutic Interventions/Progress Updates:    Pt sitting up in bed, appearing restless.  OT instructed pt to sit EOB, however pt attempting to exit bed away from therapist requiring mod assist for redirection.  Pt encouraged to complete stand pivot for toileting but pt first not initiating transfer and then slightly resistive to OTs attempts to assist. Pt required mod assist to stand and pivot, then pt refusing to complete clothing mgt for toileting and resisting OTs attempts to assist, therefore discontinued and returned back to bed.  Pt pointing at sink repetitively; OT provided pt with white erase board and pt wrote "yes" when OT asked if she wanted to go to the sink.  Pt unable to write further legible words and distracted tracing edge of board despite simple VCs to facilitate better communication.  Pt ambulated to sink with bilateral hand held assist, therapist positioned in front of pt to limit pt from grasping at items and distractibility.  Pt stood at sink and provided with wash cloth, and instructed to wash face however pt perseverating on washing sink countertop despite multmodal cueing.  Pt completed stand to sit at w/c, stand pivot w/c to EOB, and sitting EOB to long sitting in bed with mod assist.  Pelvic and bilateral wrist restraints applied, call bell in reach, 4 bed rails up,  bed alarm on at end of session.    Therapy Documentation Precautions:  Precautions Precautions: Fall Precaution Comments: trach 28%/5L; pelvic restraint in bed, PMSV with staff, very low voice quality Required Braces or Orthoses: Other Brace Other Brace: abdominal binder - for PEG Restrictions Weight Bearing Restrictions: No   Therapy/Group: Individual Therapy  Ezekiel Slocumb 05/12/2020, 12:53 PM

## 2020-05-12 NOTE — Progress Notes (Signed)
Patient ID: Stacy Moran, female   DOB: [birth year], 65 y.o.   MRN: [id number]  Daughter was here to begin education on Sat with PT & OT will need to come in this weekend also. Will need to have RN begin trach, PEG and other education. Will have daughter come back this Sat for more education. Pt seems to be more agitated today. MD aware of this-pulling trach and un-hooking from wall.

## 2020-05-12 NOTE — Progress Notes (Signed)
[facility] PHYSICAL MEDICINE & REHABILITATION PROGRESS NOTE  Subjective/Complaints: Patient seen sitting up she is restless.  Per nursing, patient did not sleep well overnight because she slept for majority of the day yesterday.  Discussed alertness and agitation with nursing.  ROS: Unreliable due to cognition  Objective: Vital Signs: Blood pressure (!) 126/101, pulse 90, temperature 98.5 F (36.9 C), temperature source Oral, resp. rate 18, height 5\' 9"  (1.753 m), weight 59.3 kg, SpO2 100 %. DG Chest 2 View  Result Date: 05/11/2020 CLINICAL DATA:  Fever EXAM: CHEST - 2 VIEW COMPARISON:  03/01/2020 FINDINGS: Cardiomegaly. Tracheostomy. Both lungs are clear. The visualized skeletal structures are unremarkable. IMPRESSION: 1. Cardiomegaly without acute abnormality of the lungs. No airspace opacity. 2.  Tracheostomy. Electronically Signed   By: Eddie Candle M.D.   On: 05/11/2020 11:20   Recent Labs    05/11/20 0830  WBC 8.0  HGB 12.4  HCT 40.3  PLT 126*   Recent Labs    05/11/20 0830  NA 139  K 4.3  CL 103  CO2 28  GLUCOSE 103*  BUN 26*  CREATININE 0.60  CALCIUM 9.9    Intake/Output Summary (Last 24 hours) at 05/12/2020 0836 Last data filed at 05/11/2020 1950 Gross per 24 hour  Intake --  Output 1200 ml  Net -1200 ml        Physical Exam: BP (!) 126/101 (BP Location: Left Arm)   Pulse 90   Temp 98.5 F (36.9 C) (Oral)   Resp 18   Ht 5\' 9"  (1.753 m)   Wt 59.3 kg   SpO2 100%   BMI 19.31 kg/m  Constitutional: No distress . Vital signs reviewed. HENT: Normocephalic.  Atraumatic. Neck: #6 trach with trach collar Eyes: EOMI. No discharge. Cardiovascular: No JVD.  RRR. Respiratory: Normal effort.  No stridor.  Bilateral clear to auscultation. GI: Non-distended.  BS +. Skin: Warm and dry.  Intact. Psych: Restless.  Limited due to cognition. Musc: No edema in extremities.  No tenderness in extremities. Neuro: Alert Motor: 4+-5/5 throughout,  unchanged  Assessment/Plan: 1. Functional deficits which require 3+ hours per day of interdisciplinary therapy in a comprehensive inpatient rehab setting.  Physiatrist is providing close team supervision and 24 hour management of active medical problems listed below.  Physiatrist and rehab team continue to assess barriers to discharge/monitor patient progress toward functional and medical goals   Care Tool:  Bathing    Body parts bathed by patient: Right arm,Left arm,Chest,Abdomen,Right upper leg,Left upper leg,Right lower leg,Left lower leg,Face,Front perineal area,Buttocks   Body parts bathed by helper: Front perineal area,Buttocks     Bathing assist Assist Level: Minimal Assistance - Patient > 75%     Upper Body Dressing/Undressing Upper body dressing   What is the patient wearing?: Pull over shirt    Upper body assist Assist Level: Minimal Assistance - Patient > 75%    Lower Body Dressing/Undressing Lower body dressing      What is the patient wearing?: Incontinence brief,Pants     Lower body assist Assist for lower body dressing: Moderate Assistance - Patient 50 - 74%     Toileting Toileting Toileting Activity did not occur Landscape architect and hygiene only): Refused  Toileting assist Assist for toileting: Moderate Assistance - Patient 50 - 74%     Transfers Chair/bed transfer  Transfers assist     Chair/bed transfer assist level: Minimal Assistance - Patient > 75%     Locomotion Ambulation   Ambulation assist  Assist level: Contact Guard/Touching assist Assistive device: Hand held assist Max distance: 6   Walk 10 feet activity   Assist  Walk 10 feet activity did not occur: Refused  Assist level: 2 helpers Assistive device: Hand held assist   Walk 50 feet activity   Assist Walk 50 feet with 2 turns activity did not occur: Refused  Assist level: 2 helpers Assistive device: Hand held assist    Walk 150 feet  activity   Assist Walk 150 feet activity did not occur: Refused  Assist level: 2 helpers Assistive device: Hand held assist    Walk 10 feet on uneven surface  activity   Assist Walk 10 feet on uneven surfaces activity did not occur: Safety/medical concerns         Wheelchair     Assist Will patient use wheelchair at discharge?: Yes Type of Wheelchair: Manual Wheelchair activity did not occur: Refused         Wheelchair 50 feet with 2 turns activity    Assist            Wheelchair 150 feet activity     Assist          Medical Problem List and Plan: 1. 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  Continue CIR 2.  Antithrombotics: -DVT/anticoagulation: Subcutaneous heparin             -antiplatelet therapy: N/A 3. Pain Management: Tylenol as needed 4. Mood: Provide emotional support             -antipsychotic agents: Seroquel 25 mg twice daily   Agitation improved   Ativan changed to Klonopin-will evaluate lethargy             -pt is fall risk 5. Neuropsych: This patient is not capable of making decisions on her own behalf.  Ritalin twice daily started on 2/2 with improvement, increased on 2/9, continue to monitor-May need to decrease if persistent restlessness 6. Skin/Wound Care: Routine skin checks 7. Fluids/Electrolytes/Nutrition: Routine in and outs  BMP within acceptable range on 2/13 8. Seizure prophylaxis.   Keppra decreased to 500 BID on 1/28, decreased further on 2/2, DC'd on 2/7 9. 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 Constance Holster.   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 Earleen Newport.       Dietary and speech therapy follow-up  Changed tube feeds to bolus feeds-discussed with dietary.  Advanced diet as tolerated 11. Hyperglycemia related to tube feeds. SSI   CBG (last 3)  Recent Labs    05/12/20 0007 05/12/20 0422 05/12/20 0758  GLUCAP 95 104* 108*   Relatively controlled on 2/14 12. Hypothyroidism: Syndthroid 27. Hypertension. Hydralazine 50 mg every 8 hours, Normodyne 300 mg 3 times daily, Norvasc 10 mg daily   Vitals:   05/12/20 0500 05/12/20 0516  BP:  (!) 126/101  Pulse: 92 90  Resp: 18 18  Temp:  98.5 F (36.9 C)  SpO2: 99% 100%   Controlled on 2/14  Monitor with increased mobility 13.  Acute blood loss anemia: Resolved  Hemoglobin 12.4 on  2/13  Continue to monitor 14. Slow transit constipation:   Improving  LOS: 20 days A FACE TO FACE EVALUATION WAS PERFORMED  Monicia Tse Lorie Phenix 05/12/2020, 8:36 AM

## 2020-05-12 NOTE — Progress Notes (Signed)
Physical Therapy Session Note  Patient Details  Name: Stacy Moran MRN: [id number] Date of Birth: [birth year]  Today's Date: 05/12/2020 PT Individual Time: 1100-1200 PT Individual Time Calculation (min): 60 min   Short Term Goals: Week 3:  PT Short Term Goal 1 (Week 3): Pt will complete bed mobility with supervision PT Short Term Goal 2 (Week 3): Pt will consistently perform bed<>chair transfers with CGA and LRAD PT Short Term Goal 3 (Week 3): Pt will ambulate 183ft with minA of 1 person and LRAD PT Short Term Goal 4 (Week 3): Pt will initiate stair training PT Short Term Goal 5 (Week 3): Pt will consistently demonstrate improved safety awareness and decreased impulsivity with functional mobility tasks  Skilled Therapeutic Interventions/Progress Updates:    Pt received sitting upright in manual w/c, awake and alert and quite restless with concern for her attempting to get out of chair without supervision. Pelvic restraint donned. Pt with trach mask on but she appeared to have disconnected it from wall port. Assessed her oxygen while she was on RA reading 97%. RN was notified of how patient was found and he also reached out to MD for clarification of oxygen requirements. Reconnected to 4L wall O2 and used portable tank on 4L for remainder of our session. Sit<>stand with min/modA with bilateral hand held assist and +2 assist for line management. Ambulated with min/modA to the bathroom with hand held assist for timed toileting. She required mod/maxA for sitting to toilet safely due to impulsively trying to sit down prior to being over toilet. Provided her time to void but pt unable. Required maxA for donning a new clean brief and maxA for donning pants and modA for a shirt. Apraxia limiting movement patterns for simple functional tasks. Ambulated back to her bed with min/modA with very poor safety awareness during gait. Stand<>pivot to w/c and patient transported to main rehab gym. Focused remainder of  session on take attenuation. Placed x5 cards in front of her with matching card. Instructed her to perform simple matching which she required min cues for accuracy. With activity in gym, pt is VERY distractible. Used curtain to assist with limiting this but sounds impacted just as much as visual distracters. Attempted to perform egg cratch replication with colors and connect-4 with simple line of colors but patient mostly non participatory for these activities and would otherwise look around the gym. Returned to her room with [place] in w/c and performed stand<>pivot with minA back to bed. Nursing requesting she remain in bed at end of session due to her restlessness. She required maxA for initiation to supine and had difficulty cooperating. She ended session long sitting in bed with pelvic restraint on, needs in reach, bed alarm on, and reconnected to wall O2 at 4L. Rn was notified at end of session regarding patient's status.  Therapy Documentation Precautions:  Precautions Precautions: Fall Precaution Comments: trach 28%/5L; pelvic restraint in bed, PMSV with staff, very low voice quality Required Braces or Orthoses: Other Brace Other Brace: abdominal binder - for PEG Restrictions Weight Bearing Restrictions: No General:    Therapy/Group: Individual Therapy  Jovanni Eckhart P Elyon Zoll PT 05/12/2020, 7:28 AM

## 2020-05-13 LAB — GLUCOSE, CAPILLARY
Glucose-Capillary: 101 mg/dL — ABNORMAL HIGH (ref 70–99)
Glucose-Capillary: 122 mg/dL — ABNORMAL HIGH (ref 70–99)
Glucose-Capillary: 162 mg/dL — ABNORMAL HIGH (ref 70–99)
Glucose-Capillary: 191 mg/dL — ABNORMAL HIGH (ref 70–99)
Glucose-Capillary: 71 mg/dL (ref 70–99)
Glucose-Capillary: 92 mg/dL (ref 70–99)
Glucose-Capillary: 93 mg/dL (ref 70–99)

## 2020-05-13 NOTE — Progress Notes (Signed)
Occupational Therapy Session Note  Patient Details  Name: Stacy Moran MRN: [id number] Date of Birth: 08/06/1955  Today's Date: 05/13/2020 OT Individual Time: 1430-1530 OT Individual Time Calculation (min): 60 min    Short Term Goals: Week 3:  OT Short Term Goal 1 (Week 3): Pt will complete stand pivot transfers without grabbing at her lines and leads to demonstrate improved attention. OT Short Term Goal 2 (Week 3): Pt will demonstrate improved attention by bathing UB with no more than 4 cues to attend to task. OT Short Term Goal 3 (Week 3): Pt will don pants over feet with S and over hips with CGA.  Skilled Therapeutic Interventions/Progress Updates:    Pt semiupright in bed, asleep, however easily aroused with VCs and TCs. Pt encouraged to use white erase board to communicate when OT prompting orientation/memory questions using one word answers.  Pt able to correctly write name of daughter via writing, however, pt unable to provide current month, day, or year.   Pt required mod assist supine to sit with HOB slightly elevated.  Max assist needed for UB dressing and bathing sitting EOB with OT providing hand over hand to promote increased pt participation and pt agreeable to this technique.  Pt tolerated sitting EOB x 10 minutes without signs of restlessness requiring only supervision.  Pt completed stand pivot EOB to armchair with min assist using bilateral hand held technique, therapist positioned in front of pt to limit distraction.  Pt participated in blocked practice sit<>stand x 3 trials needing min assist.  Standing and marching in place completed x 1 minute interval needing max VCs to sequence and stay on task with CGA for balance.  Stand pivot arm chair to w/c with min assist primarily for sequencing of transfer. Pt required intermittent VCs to promote productive coughing with dependent level external suctioning of secretions throughout session.  SPO2 levels remained above 96% throughout  session on 5 L at 28% humidified air via trach collar. Pelvic restraint donned, seat alarm on, telesitter on, safety hazards out of reach, call bell in reach, nurse notified of pts location at end of session.  Pt demonstrated improved participation today during OT session, less distractibility and less agitation, however relatively flat affect and requiring max multimodal cues throughout.    Therapy Documentation Precautions:  Precautions Precautions: Fall Precaution Comments: trach 28%/5L; pelvic restraint in bed, PMSV with staff, very low voice quality Required Braces or Orthoses: Other Brace Other Brace: abdominal binder - for PEG Restrictions Weight Bearing Restrictions: No   Therapy/Group: Individual Therapy  Ezekiel Slocumb 05/13/2020, 4:15 PM

## 2020-05-13 NOTE — Progress Notes (Signed)
Physical Therapy Session Note  Patient Details  Name: Stacy Moran MRN: [id number] Date of Birth: 1955-05-28  Today's Date: 05/13/2020 PT Individual Time: 3953-2023 PT Individual Time Calculation (min): 10 min   and  Today's Date: 05/13/2020 PT Missed Time: 20 Minutes Missed Time Reason: Patient fatigue  Short Term Goals: Week 3:  PT Short Term Goal 1 (Week 3): Pt will complete bed mobility with supervision PT Short Term Goal 2 (Week 3): Pt will consistently perform bed<>chair transfers with CGA and LRAD PT Short Term Goal 3 (Week 3): Pt will ambulate 117ft with minA of 1 person and LRAD PT Short Term Goal 4 (Week 3): Pt will initiate stair training PT Short Term Goal 5 (Week 3): Pt will consistently demonstrate improved safety awareness and decreased impulsivity with functional mobility tasks  Skilled Therapeutic Interventions/Progress Updates:    Pt received asleep, supine in bed with bilateral wrist restraints donned. Received on 5L of O2 via trach collar with SpO2 99% and HR 82bpm on continuous monitor. Despite multiple attempts at awakening pt (wet wash cloth to face, verbal and tactile stimulus, removing covers) pt unable to awaken with eyes remaining closed. Provided +2 dependent assist to scoot towards [facility] as pt had slid down with feet near edge of bed and repositioned for improved upright posture to support respiratory and cardio function as well as increased altertness. Pt left supine in bed with needs in reach, restraints still in place, lines intact, and bed alarm on. NT aware of pt's lethargy. Missed 20 minutes of skilled physical therapy.  Therapy Documentation Precautions:  Precautions Precautions: Fall Precaution Comments: trach 28%/5L; pelvic restraint in bed, PMSV with staff, very low voice quality Required Braces or Orthoses: Other Brace Other Brace: abdominal binder - for PEG Restrictions Weight Bearing Restrictions: No  Therapy/Group: Individual Therapy  Tawana Scale , PT, DPT, CSRS  05/13/2020, 8:02 AM

## 2020-05-13 NOTE — Progress Notes (Signed)
Physical Therapy Session Note  Patient Details  Name: Stacy Moran MRN: [id number] Date of Birth: [birth year]  Today's Date: 05/13/2020 PT Individual Time: 1300-1340 PT Individual Time Calculation (min): 40 min   Short Term Goals: Week 3:  PT Short Term Goal 1 (Week 3): Pt will complete bed mobility with supervision PT Short Term Goal 2 (Week 3): Pt will consistently perform bed<>chair transfers with CGA and LRAD PT Short Term Goal 3 (Week 3): Pt will ambulate 140ft with minA of 1 person and LRAD PT Short Term Goal 4 (Week 3): Pt will initiate stair training PT Short Term Goal 5 (Week 3): Pt will consistently demonstrate improved safety awareness and decreased impulsivity with functional mobility tasks  Skilled Therapeutic Interventions/Progress Updates:     Pt greeted sleeping in bed, awakens to voice and multi-modal cues but requires max cues to sustain. O2 100% and HR 78 while on 4L wall O2 via trach mask. Provided her cool washcloth to attempt improving alertness but she kept her eyes closed and wouldn't engage. Removed her bilateral wrist restraints and performed supine<>sit with maxA for initiation and participation. Once sitting EOB, she was able to sit with supervision while unsupported but continued to keep eyes closed. Performed stand<>pivot transfer with modA from EOB to [facility]. Pt continent of bladder, charted in flowsheets. Stand<>pivot with modA back to EOB and required totalA for donning a new brief and totalA for threading pants with modA for pulling up over hips in standing. Attempted to engage her in seated activities within her room to limit distractions. Brought playing cards and attempted to have her sequence cards #2-#10 but patient not engaging. Began the 1st 3 cards for her but she continued to put limited to no effort in completing. Deferred further attempts and attempted a different activity with seated ball toss. Pt able to complete a few tosses with max cues but delayed  reactions with UE's and often she would just let the ball hit her hands rather than attempting to catch. Unable to engage her in functional mobility training. Returned to supine with maxA for initiation. Reapplied bilateral wrist restraints and patient positioned with HOB fully elevated. Provided external suction with encouraged productive cough. Bed lowered, bed rails up, needs within reach, bed alarm on at end of session. Pt missed 20 minutes of skilled therapy due to fatigue and unwillingness to participate.    Therapy Documentation Precautions:  Precautions Precautions: Fall Precaution Comments: trach 28%/5L; pelvic restraint in bed, PMSV with staff, very low voice quality Required Braces or Orthoses: Other Brace Other Brace: abdominal binder - for PEG Restrictions Weight Bearing Restrictions: No General: PT Amount of Missed Time (min): 20 Minutes PT Missed Treatment Reason: Patient fatigue  Therapy/Group: Individual Therapy  Jessyca Sloan P Khayree Delellis  PT 05/13/2020, 7:31 AM

## 2020-05-13 NOTE — Plan of Care (Addendum)
  Problem: RH Bathing Goal: LTG Patient will bathe all body parts with assist levels (OT) Description: LTG: Patient will bathe all body parts with assist levels (OT) Flowsheets (Taken 05/13/2020 1629) LTG: Pt will perform bathing with assistance level/cueing: Minimal Assistance - Patient > 75% (downgraded due to pt making slow progress) LTG: Position pt will perform bathing: At sink   Problem: RH Dressing Goal: LTG Patient will perform upper body dressing (OT) Description: LTG Patient will perform upper body dressing with assist, with/without cues (OT). Flowsheets (Taken 05/13/2020 1629) LTG: Pt will perform upper body dressing with assistance level of: Minimal Assistance - Patient > 75% (downgraded due to pt making slow progress)   Problem: RH Toileting Goal: LTG Patient will perform toileting task (3/3 steps) with assistance level (OT) Description: LTG: Patient will perform toileting task (3/3 steps) with assistance level (OT)  Flowsheets (Taken 05/13/2020 1629) LTG: Pt will perform toileting task (3/3 steps) with assistance level: Minimal Assistance - Patient > 75% (downgraded due to pt making slow progress)   Problem: RH Attention Goal: LTG Patient will demonstrate this level of attention during functional activites (OT) Description: LTG:  Patient will demonstrate this level of attention during functional activites  (OT) Flowsheets (Taken 05/13/2020 1629) LTG: Patient will demonstrate this level of attention during functional activites (OT): Moderate Assistance - Patient 50 - 74% (downgraded due to pt making slow progress)   Problem: RH Awareness Goal: LTG: Patient will demonstrate awareness during functional activites type of (OT) Description: LTG: Patient will demonstrate awareness during functional activites type of (OT) Flowsheets (Taken 05/13/2020 1629) LTG: Patient will demonstrate awareness during functional activites type of (OT): Moderate Assistance - Patient 50 - 74% (downgraded  due to pt making slow progress)

## 2020-05-13 NOTE — Progress Notes (Signed)
Pt very restless from 1900-2200. Then pt slept peacefully the rest of the night.

## 2020-05-13 NOTE — Progress Notes (Signed)
Pt's daughter [name] is present and is eager and nervous to learn to give PEG tube feeds. This nurse took the time instructed daughter about meds and demonstrated to daughter how to give tube feed and medications.

## 2020-05-13 NOTE — Progress Notes (Addendum)
[place] PHYSICAL MEDICINE & REHABILITATION PROGRESS NOTE  Subjective/Complaints: Patient seen laying in bed this AM.  Patient was initially restless overnight, but sleep well after 10PM.  She is still sleepy this AM.    ROS: Unreliable due to cognition  Objective: Vital Signs: Blood pressure 115/75, pulse 73, temperature 97.6 F (36.4 C), temperature source Oral, resp. rate 16, height 5\' 9"  (1.753 m), weight 59.3 kg, SpO2 98 %. DG Chest 2 View  Result Date: 05/11/2020 CLINICAL DATA:  Fever EXAM: CHEST - 2 VIEW COMPARISON:  03/01/2020 FINDINGS: Cardiomegaly. Tracheostomy. Both lungs are clear. The visualized skeletal structures are unremarkable. IMPRESSION: 1. Cardiomegaly without acute abnormality of the lungs. No airspace opacity. 2.  Tracheostomy. Electronically Signed   By: Eddie Candle M.D.   On: 05/11/2020 11:20   Recent Labs    05/11/20 0830  WBC 8.0  HGB 12.4  HCT 40.3  PLT 126*   Recent Labs    05/11/20 0830  NA 139  K 4.3  CL 103  CO2 28  GLUCOSE 103*  BUN 26*  CREATININE 0.60  CALCIUM 9.9    Intake/Output Summary (Last 24 hours) at 05/13/2020 3016 Last data filed at 05/12/2020 2200 Gross per 24 hour  Intake 355 ml  Output --  Net 355 ml        Physical Exam: BP 115/75 (BP Location: Left Arm)   Pulse 73   Temp 97.6 F (36.4 C) (Oral)   Resp 16   Ht 5\' 9"  (1.753 m)   Wt 59.3 kg   SpO2 98%   BMI 19.31 kg/m   Constitutional: No distress . Vital signs reviewed. HENT: Normocephalic.  Atraumatic. Neck: #6 trach with trach collar Eyes: EOMI. No discharge. Cardiovascular: No JVD.  RRR. Respiratory: Normal effort.  No stridor.  Bilateral clear to auscultation. GI: Non-distended.  BS +. +PEG. Skin: Warm and dry.  Intact. Psych: Limited due to cognition. Musc: No edema in extremities.  No tenderness in extremities. Neuro: Somnolent Motor: 4+-5/5 throughout, appears unchanged  Assessment/Plan: 1. Functional deficits which require 3+ hours per day  of interdisciplinary therapy in a comprehensive inpatient rehab setting.  Physiatrist is providing close team supervision and 24 hour management of active medical problems listed below.  Physiatrist and rehab team continue to assess barriers to discharge/monitor patient progress toward functional and medical goals   Care Tool:  Bathing    Body parts bathed by patient: Right arm,Left arm,Chest,Abdomen,Right upper leg,Left upper leg,Right lower leg,Left lower leg,Face,Front perineal area,Buttocks   Body parts bathed by helper: Front perineal area,Buttocks     Bathing assist Assist Level: Minimal Assistance - Patient > 75%     Upper Body Dressing/Undressing Upper body dressing   What is the patient wearing?: Pull over shirt    Upper body assist Assist Level: Minimal Assistance - Patient > 75%    Lower Body Dressing/Undressing Lower body dressing      What is the patient wearing?: Incontinence brief,Pants     Lower body assist Assist for lower body dressing: Moderate Assistance - Patient 50 - 74%     Toileting Toileting Toileting Activity did not occur Landscape architect and hygiene only): Refused  Toileting assist Assist for toileting: Moderate Assistance - Patient 50 - 74%     Transfers Chair/bed transfer  Transfers assist     Chair/bed transfer assist level: Minimal Assistance - Patient > 75%     Locomotion Ambulation   Ambulation assist      Assist level: 2  helpers Assistive device: Hand held assist Max distance: 300'   Walk 10 feet activity   Assist  Walk 10 feet activity did not occur: Refused  Assist level: 2 helpers Assistive device: Hand held assist   Walk 50 feet activity   Assist Walk 50 feet with 2 turns activity did not occur: Refused  Assist level: 2 helpers Assistive device: Hand held assist    Walk 150 feet activity   Assist Walk 150 feet activity did not occur: Refused  Assist level: 2 helpers Assistive device: Hand  held assist    Walk 10 feet on uneven surface  activity   Assist Walk 10 feet on uneven surfaces activity did not occur: Safety/medical concerns         Wheelchair     Assist Will patient use wheelchair at discharge?: Yes Type of Wheelchair: Manual Wheelchair activity did not occur: Refused         Wheelchair 50 feet with 2 turns activity    Assist            Wheelchair 150 feet activity     Assist          Medical Problem List and Plan: 1. 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  Continue CIR 2.  Antithrombotics: -DVT/anticoagulation: Subcutaneous heparin             -antiplatelet therapy: N/A 3. Pain Management: Tylenol as needed 4. Mood: Provide emotional support             -antipsychotic agents: Seroquel 25 mg twice daily   Ativan changed to Klonopin - continue to evaluate lethargy             -pt is fall risk 5. Neuropsych: This patient is not capable of making decisions on her own behalf.  Ritalin twice daily started on 2/2 with improvement, increased on 2/9, continue to monitor-decreased due to restlessness after discussion with therapies  Telesitter for safety 6. Skin/Wound Care: Routine skin checks 7. Fluids/Electrolytes/Nutrition: Routine in and outs  BMP within acceptable range on 2/13 8. Seizure prophylaxis.   Keppra decreased to 500 BID on 1/28, decreased further on 2/2, DC'd on 2/7 9. 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 Constance Holster.   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 Earleen Newport.       Dietary and speech therapy follow-up  Changed tube feeds to bolus feeds-discussed with dietary.  Advanced diet as tolerated 11. Hyperglycemia related to tube feeds. SSI   CBG (last 3)  Recent Labs    05/13/20 0007 05/13/20 0458 05/13/20 0807  GLUCAP 162* 101* 92   Relatively controlled on 2/15 12. Hypothyroidism: Syndthroid 75. Hypertension. Hydralazine 50 mg every 8 hours, Normodyne 300 mg 3 times daily, Norvasc 10 mg daily   Vitals:   05/13/20 0416 05/13/20 0737  BP: 115/75   Pulse: 68 73  Resp: 14 16  Temp: 97.6 F (36.4 C)   SpO2: 100% 98%   Controlled on 2/15  Monitor with increased mobility 13.  Acute blood loss anemia: Resolved  Hemoglobin 12.4 on 2/13  Continue to monitor 14. Slow transit constipation:   Improving  LOS: 21 days A FACE TO FACE EVALUATION WAS PERFORMED  Sherice Ijames Lorie Phenix 05/13/2020, 8:23 AM

## 2020-05-13 NOTE — Progress Notes (Signed)
SLP Cancellation Note  Patient Details Name: Stacy Moran MRN: [id number] DOB: 1956-01-08   Cancelled treatment:       Patient missed 30 minutes of skilled SLP intervention due to fatigue. Patient asleep inbed and unable to maintain efficient arousal, will re-attempt as able.                                                                                                Giovannie Scerbo, [place] 05/13/2020, 3:15 PM

## 2020-05-14 DIAGNOSIS — D696 Thrombocytopenia, unspecified: Secondary | ICD-10-CM

## 2020-05-14 LAB — GLUCOSE, CAPILLARY
Glucose-Capillary: 113 mg/dL — ABNORMAL HIGH (ref 70–99)
Glucose-Capillary: 129 mg/dL — ABNORMAL HIGH (ref 70–99)
Glucose-Capillary: 129 mg/dL — ABNORMAL HIGH (ref 70–99)
Glucose-Capillary: 139 mg/dL — ABNORMAL HIGH (ref 70–99)
Glucose-Capillary: 83 mg/dL (ref 70–99)
Glucose-Capillary: 89 mg/dL (ref 70–99)

## 2020-05-14 NOTE — Progress Notes (Signed)
Pt has had a good day without behaviors. Pt toileted throughout day and has remained continent.

## 2020-05-14 NOTE — Patient Care Conference (Signed)
Inpatient RehabilitationTeam Conference and Plan of Care Update Date: 05/14/2020   Time: 11:11 AM    Patient Name: Stacy Moran      Medical Record Number: [id number]  Date of Birth: 31-Mar-1955 Sex: Female         Room/Bed: 4M01C/4M01C-01 Payor Info: Payor: MEDICAID POTENTIAL / Plan: MEDICAID POTENTIAL / Product Type: *No Product type* /    Admit Date/Time:  04/22/2020  2:29 PM  Primary Diagnosis:  ICH (intracerebral hemorrhage) [facility])  Hospital Problems: Principal Problem:   ICH (intracerebral hemorrhage) ([place]) Active Problems:   Benign essential HTN   Dysphagia   Status post tracheostomy ([place])   S/P percutaneous endoscopic gastrostomy (PEG) tube placement ([place])   Protein-calorie malnutrition, severe   Dysphagia, post-stroke   Essential hypertension   Seizure prophylaxis   Acute blood loss anemia   Restlessness   Restlessness and agitation   Thrombocytopenia [facility])    Expected Discharge Date: Expected Discharge Date: 05/21/20  Team Members Present: Physician leading conference: Dr. Delice Lesch Care Coodinator Present: Dorien Chihuahua, RN, BSN, CRRN;Becky Dupree, LCSW Nurse Present: Isla Pence, RN PT Present: Ginnie Smart, PT OT Present: Meriel Pica, OT SLP Present: Nadara Mode, SLP PPS Coordinator present : Gunnar Fusi, SLP     Current Status/Progress Goal Weekly Team Focus  Bowel/Bladder   Pt continent with episodes of incontinence of urine. LBM 05/10/2020. Sorbitol given 05/13/2020  Continent B/B. BM every 3 days or less  Assess B/B every shift and PRN   Swallow/Nutrition/ Hydration             ADL's   min to max A; pt fluctuates greatly in performance depending on alertness, attention, and participation  downgraded to min assist  ADL training, balance, cognition, endurance, coordination/praxis   Mobility   Fluctuating level of performance based on alertness, restlesness, and impulsivity. Mostly minA for bed mobility, minA transfers, min/modA +2 gait up  to ~24ft  Downgraded goals to minA overall  Functional task initiation, functional transfers, DC planning, family ed.   Communication             Safety/Cognition/ Behavioral Observations            Pain   Pt consistently has FACES scale of 0  pain scale of <3/10  Assess pain every shift and PRN   Skin   no breakdown  no new breakdown  Assess skin every shift and PRN     Discharge Planning:  Daughter was here Sat to begin family training and will come again this Sat, will need to do trach/Peg education. Aware will require 24/7 care at DC   Team Discussion: Continue with trach for airway clearance and PEG for nutritional means. Behavior fluctuates, poor intiation and engagement with activities. Patient is continent of bowel and bladder  Patient on target to meet rehab goals: Slow, steady progress  *See Care Plan and progress notes for long and short-term goals.   Revisions to Treatment Plan:   Teaching Needs: Transfers, toileting, medications, trach care, PEG care and nutritional means, etc.   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 and bur hole evacuation of  chronic subdural hematoma 03/18/2020  Barriers to Discharge: Behavior;Medical stability;New oxygen;Decreased family/caregiver support;Trach;Incontinence  Barriers to Discharge Comments: Secretion, insurance for therapies/DME/Care post discharge Possible Resolutions to [employer] Focus: Therapies, monitor Trach/PEG, meds to decrease  sections, monitor lethargy and restless with med changes, follow labs - platelets   Continued Need for Acute Rehabilitation Level of Care: The patient requires daily medical management by a physician with specialized training in physical medicine and rehabilitation for the following reasons: Direction of a multidisciplinary physical rehabilitation program to maximize functional independence : Yes Medical management of patient stability for increased activity during participation in an intensive rehabilitation regime.: Yes Analysis of laboratory values and/or radiology reports with any subsequent need for medication adjustment and/or medical intervention. : Yes   I attest that I was present, lead the team conference, and concur with the assessment and plan of the team.   Dorien Chihuahua B 05/14/2020, 2:02 PM

## 2020-05-14 NOTE — Progress Notes (Signed)
Pt is restless immediately after pt's daughter leaves, but then goes to sleep through the night. Pt attempted to get out of bed by herself and was assisted to the [facility].

## 2020-05-14 NOTE — Plan of Care (Signed)
  Problem: RH Balance Goal: LTG Patient will maintain dynamic standing balance (PT) Description: LTG:  Patient will maintain dynamic standing balance with assistance during mobility activities (PT) Flowsheets (Taken 05/14/2020 0734) LTG: Pt will maintain dynamic standing balance during mobility activities with:: Minimal Assistance - Patient > 75% Note: Downgraded due to slower than anticipated progress, poor safety awareness, and cognitive impairments.   Problem: Sit to Stand Goal: LTG:  Patient will perform sit to stand with assistance level (PT) Description: LTG:  Patient will perform sit to stand with assistance level (PT) Flowsheets (Taken 05/14/2020 0734) LTG: PT will perform sit to stand in preparation for functional mobility with assistance level: Minimal Assistance - Patient > 75% Note: Downgraded due to slower than anticipated progress, poor safety awareness, and cognitive impairments.   Problem: RH Bed Mobility Goal: LTG Patient will perform bed mobility with assist (PT) Description: LTG: Patient will perform bed mobility with assistance, with/without cues (PT). Flowsheets (Taken 05/14/2020 0734) LTG: Pt will perform bed mobility with assistance level of: Minimal Assistance - Patient > 75% Note: Downgraded due to slower than anticipated progress, poor safety awareness, and cognitive impairments.   Problem: RH Bed to Chair Transfers Goal: LTG Patient will perform bed/chair transfers w/assist (PT) Description: LTG: Patient will perform bed to chair transfers with assistance (PT). Flowsheets (Taken 05/14/2020 0734) LTG: Pt will perform Bed to Chair Transfers with assistance level: Minimal Assistance - Patient > 75% Note: Downgraded due to slower than anticipated progress, poor safety awareness, and cognitive impairments.   Problem: RH Ambulation Goal: LTG Patient will ambulate in controlled environment (PT) Description: LTG: Patient will ambulate in a controlled environment, # of  feet with assistance (PT). Flowsheets (Taken 05/14/2020 0734) LTG: Pt will ambulate in controlled environ  assist needed:: Minimal Assistance - Patient > 75% LTG: Ambulation distance in controlled environment: 150 Note: Downgraded due to slower than anticipated progress, poor safety awareness, and cognitive impairments. Goal: LTG Patient will ambulate in home environment (PT) Description: LTG: Patient will ambulate in home environment, # of feet with assistance (PT). Flowsheets (Taken 05/14/2020 0734) LTG: Pt will ambulate in home environ  assist needed:: Minimal Assistance - Patient > 75% LTG: Ambulation distance in home environment: 50 Note: Downgraded due to slower than anticipated progress, poor safety awareness, and cognitive impairments.   Problem: RH Wheelchair Mobility Goal: LTG Patient will propel w/c in controlled environment (PT) Description: LTG: Patient will propel wheelchair in controlled environment, # of feet with assist (PT) Flowsheets (Taken 05/14/2020 0734) LTG: Pt will propel w/c in controlled environ  assist needed:: Minimal Assistance - Patient > 75% LTG: Propel w/c distance in controlled environment: 150 Note: Downgraded due to slower than anticipated progress, poor safety awareness, and cognitive impairments. Goal: LTG Patient will propel w/c in home environment (PT) Description: LTG: Patient will propel wheelchair in home environment, # of feet with assistance (PT). Flowsheets (Taken 05/14/2020 0734) LTG: Pt will propel w/c in home environ  assist needed:: Minimal Assistance - Patient > 75% Distance: wheelchair distance in controlled environment: 50 Note: Downgraded due to slower than anticipated progress, poor safety awareness, and cognitive impairments.

## 2020-05-14 NOTE — Progress Notes (Addendum)
Patient ID: Stacy Moran, female   DOB: [birth year], 65 y.o.   MRN: [id number]  Spoke with daughter via telephone to discuss team conference progress and discharge still 2/23. She feels Sat eduction went well and will be back this Saturday. 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 She feels Mom is getting back to her old self and was stubborn and hard headed prior to this. Always did for herself and if she couldn't would go without. Will try to see if all education can be done on the weekend. She is aware pt will require 24/7 care. Have left information in room on how to apply for SSD, pt's medicaid is pending  3:00 Pm have asked MD to write order to wean O2 and see if pt needs it. May just need humidified air for trach.

## 2020-05-14 NOTE — Progress Notes (Signed)
Pt tolerated RA with O2 SATs 95%-97% while sitting. O2 SATs 91%-93% while in bed at 25 degrees. Pt tolerated well, will notify nursing to continue to monitor O2 SATs.

## 2020-05-14 NOTE — Progress Notes (Signed)
Nutrition Follow-up  DOCUMENTATION CODES:   Severe malnutrition in context of acute illness/injury  INTERVENTION:   Continue bolus tube feeding regimen via PEG: - 1.5 cartons (355 ml) of Osmolite 1.2 cal formula QID (total of 6 cartons daily) - ProSource TF 45 ml BID - Free water flushes of 200 ml q 6 hours  Tube feeding regimen provides 1790 kcal, 101 grams of protein, and 1170 ml of H2O.   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  Ongoing  GOAL:   Patient will meet greater than or equal to 90% of their needs  Met via TF  MONITOR:   Diet advancement,Labs,Weight trends,TF tolerance,I & O's  REASON FOR ASSESSMENT:   Consult Enteral/tube feeding initiation and management  ASSESSMENT:   65 year old female with unremarkable PMH. Pt presented on 01/30/20 with headache and obtundation requiring intubation for airway protection. 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 Pt underwent right frontal ventriculostomy on 02/26/20. Pt then underwent diagnostic cerebral angiogram for further delineating the left PICA aneurysm which was noted not to be amenable to endovascular treatment so pt underwent suboccipital craniotomy for clipping of PICA aneurysm 01/31/20. Pt required  tracheostomy as well as thyroidectomy on 02/08/20. Pt remains NPO with PEG in place. Admitted to CIR on 04/22/20.  Discussed pt with RN. Per RN, pt having increased secretions but tolerating tube feeding well. No reports of nausea/vomiting or abdominal discomfort.  Met with pt briefly at bedside. Pt indicates via thumbs up that she is doing well. She shakes her head "no" when RD asks about any issues with tube feeding.  Will continue with current TF regimen at this time.  Medications reviewed and include: colace, SSI q 4 hours, ritalin, protonix, scopolamine patch  Labs reviewed. CBG's: 83-191 x 24 hours  Diet Order:   Diet Order            Diet NPO time specified  Diet effective now                 EDUCATION NEEDS:   No education needs have been identified at this time  Skin:  Skin Assessment: Skin Integrity Issues: Incisions: abdomen  Last BM:  05/10/20 medium type 7  Height:   Ht Readings from Last 1 Encounters:  04/22/20 5' 9"  (1.753 m)    Weight:   Wt Readings from Last 1 Encounters:  04/22/20 59.3 kg    Ideal Body Weight:  68.2 kg  BMI:  Body mass index is 19.31 kg/m.  Estimated Nutritional Needs:   Kcal:  7998-7215  Protein:  90-105 grams  Fluid:  1.7-1.9 L    Gustavus Bryant, MS, RD, LDN Inpatient Clinical Dietitian Please see [facility] for contact information.

## 2020-05-14 NOTE — Progress Notes (Signed)
Physical Therapy Weekly Progress Note  Patient Details  Name: Stacy Moran MRN: [id number] Date of Birth: February 02, 1956  Beginning of progress report period: May 07, 2020 End of progress report period: May 14, 2020  Today's Date: 05/14/2020 PT Individual Time: 3474-2595 + 1145-1155 PT Individual Time Calculation (min): 45 min  + 10 min  Patient has met 0 of 5 short term goals. Pt is making slower than anticipated progress over the last week. Progress has been limited by her poor safety awareness, high levels of impulsivity, restlessness, decreased ability to follow simple commands consistently, motor apraxia, and mild levels of agitation. It appears there is limited receptiveness to therapist intervention however this fluctuates daily depending on fatigue vs restlessness levels. Level of performance has also varied. She most consistently requires min to modA for bed mobility (although she has shown ability to complete with supervision, primarily assist needed for initiation), min to modA for bed<>chair transfers, and has ambulated up to 247f with minA via bilateral hand held assist with +2 for w/c follow for safety due to her impulsivity.   Patient continues to demonstrate the following deficits muscle weakness, decreased cardiorespiratoy endurance and decreased oxygen support, unbalanced muscle activation, motor apraxia and decreased motor planning, decreased visual perceptual skills, decreased attention to left, decreased motor planning and ideational apraxia, decreased initiation, decreased attention, decreased awareness, decreased problem solving, decreased safety awareness, decreased memory and delayed processing and decreased standing balance, decreased postural control, hemiplegia and decreased balance strategies and therefore will continue to benefit from skilled PT intervention to increase functional independence with mobility.  Patient not progressing toward long term goals.  See  goal revision..  Plan of care revisions: See LTG plan of care note for details. LTG downgraded from supervision/CGA to mostly minA.  PT Short Term Goals Week 4:  PT Short Term Goal 1 (Week 4): STG = LTG due to ELOS  Skilled Therapeutic Interventions/Progress Updates:     1st session: Pt received supine in bed, sleeping on arrival with all lights off and curtains drawn. Turned lights on and opened curtains to allow sunlight to reduce day/night confusion and improve overall alertness. She opens eyes easily to voice but almost immediately shuts them. With verbal cueing, patient mostly non-engaging. Removed bilateral wrist retains and patient on 5L wall oxygen with 28% FIO2 via trach mask. She was able to complete supine<>sit with supervision (with bed features) and when provided hospital socks, she donned with setupA while seated EOB without LOB. Performed stand<>pivot transfer with minA and no AD from EOB to [facility] pt was continent of bladder, charted in flowsheets. Stand<>Pivot with minA back to EOB with cues for safety and initiation. Connected to 4L portable O2 and performed additional stand<>pivot back to her w/c. Needed to provide external suction due to thick secretions, encouraged her to produce productive cough to assist with clearing but she had difficulty doing this. W/c transport for time management to BITS system. While seated with no external distractions, engaged her in tracing large printed letters on BITS screen with her R hand. Pt appeared uninterested vs. Fatigued but she only attempted x1 time despite hand-over-hand assist and max cues for participating. Deferred further efforts with BITS and performed stand<>pivot transfer to Nust[place] with minA. Required maxA for positioning feet due to delayed processing and poor initiation. She completed just 1.5 minutes of this before stopping and unable to further participate, unclear reasoning. Stand<>pivot back to her w/c and returned to her room. Due  to decreased restlessness, spoke  with RN who approved patient sitting upright in w/c. Applied pelvic restraint, safety belt alarm, and reconnected to wall oxygen. Needs within reach and patient ended session seated in w/c.   2nd session: Pt received sitting upright in manual w/c with pelvic restraint and safety belt alarm on, connected to 4L wall O2 with 28% fiO2 humidified. Pt awake and alert, not restless and shakes her head when therapist enters her room. Pt shakes head 'no' when instructed on functional mobility training. Noted her to have very thick secretions coming from trach site and sounding very congested. Provided external suctioning with cues for productive cough, limited improvement and ability to clear. Coordinated with RN at end of session to have RT provide deep suctioning. Pt remained seated in w/c with safety belt and pelvic restraint on, needs within reach.  Therapy Documentation Precautions:  Precautions Precautions: Fall Precaution Comments: trach 28%/5L; pelvic restraint in bed, PMSV with staff, very low voice quality Required Braces or Orthoses: Other Brace Other Brace: abdominal binder - for PEG Restrictions Weight Bearing Restrictions: No General: PT Amount of Missed Time (min): 15 Minutes PT Missed Treatment Reason: Patient fatigue;Patient unwilling to participate  Therapy/Group: Individual Therapy  Kelty Szafran P Erielle Gawronski PT 05/14/2020, 7:32 AM

## 2020-05-14 NOTE — Progress Notes (Addendum)
[place] PHYSICAL MEDICINE & REHABILITATION PROGRESS NOTE  Subjective/Complaints: Patient seen sitting up at the edge of her bed this morning working with therapies.  She indicates she slept well overnight.  ROS: Unreliable due to cognition  Objective: Vital Signs: Blood pressure (!) 115/58, pulse 91, temperature 98.6 F (37 C), resp. rate 16, height 5\' 9"  (1.753 m), weight 59.3 kg, SpO2 98 %. No results found. No results for input(s): WBC, HGB, HCT, PLT in the last 72 hours. No results for input(s): NA, K, CL, CO2, GLUCOSE, BUN, CREATININE, CALCIUM in the last 72 hours. No intake or output data in the 24 hours ending 05/14/20 1023      Physical Exam: BP (!) 115/58 (BP Location: Right Arm)   Pulse 91   Temp 98.6 F (37 C)   Resp 16   Ht 5\' 9"  (1.753 m)   Wt 59.3 kg   SpO2 98%   BMI 19.31 kg/m   Constitutional: No distress . Vital signs reviewed. HENT: Normocephalic.  Atraumatic. Neck: #6 trach with trach collar with secretions Eyes: Right eye EOMI. No discharge. Cardiovascular: No JVD.  RRR. Respiratory: Normal effort.  No stridor.  Bilateral clear to auscultation. GI: Non-distended.  BS +.  + PEG. Skin: Warm and dry.  Intact. Psych: Limited due to cognition. Musc: No edema in extremities.  No tenderness in extremities. Neuro: Alert Motor: 4+-5/5 throughout, appears stable  Assessment/Plan: 1. Functional deficits which require 3+ hours per day of interdisciplinary therapy in a comprehensive inpatient rehab setting.  Physiatrist is providing close team supervision and 24 hour management of active medical problems listed below.  Physiatrist and rehab team continue to assess barriers to discharge/monitor patient progress toward functional and medical goals   Care Tool:  Bathing    Body parts bathed by patient: Right arm,Left arm,Chest,Abdomen,Right upper leg,Left upper leg,Right lower leg,Left lower leg,Face,Front perineal area,Buttocks   Body parts bathed by  helper: Right arm,Left arm,Chest,Abdomen     Bathing assist Assist Level: Maximal Assistance - Patient 24 - 49%     Upper Body Dressing/Undressing Upper body dressing   What is the patient wearing?: Pull over shirt    Upper body assist Assist Level: Maximal Assistance - Patient 25 - 49%    Lower Body Dressing/Undressing Lower body dressing      What is the patient wearing?: Incontinence brief,Pants     Lower body assist Assist for lower body dressing: Moderate Assistance - Patient 50 - 74%     Toileting Toileting Toileting Activity did not occur Landscape architect and hygiene only): Refused  Toileting assist Assist for toileting: Moderate Assistance - Patient 50 - 74%     Transfers Chair/bed transfer  Transfers assist     Chair/bed transfer assist level: Minimal Assistance - Patient > 75%     Locomotion Ambulation   Ambulation assist      Assist level: 2 helpers Assistive device: Hand held assist Max distance: 300'   Walk 10 feet activity   Assist  Walk 10 feet activity did not occur: Refused  Assist level: 2 helpers Assistive device: Hand held assist   Walk 50 feet activity   Assist Walk 50 feet with 2 turns activity did not occur: Refused  Assist level: 2 helpers Assistive device: Hand held assist    Walk 150 feet activity   Assist Walk 150 feet activity did not occur: Refused  Assist level: 2 helpers Assistive device: Hand held assist    Walk 10 feet on uneven surface  activity   Assist Walk 10 feet on uneven surfaces activity did not occur: Safety/medical concerns         Wheelchair     Assist Will patient use wheelchair at discharge?: Yes Type of Wheelchair: Manual Wheelchair activity did not occur: Refused         Wheelchair 50 feet with 2 turns activity    Assist            Wheelchair 150 feet activity     Assist          Medical Problem List and Plan: 1. 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  Continue CIR  Team conference today to discuss current and goals and coordination of care, home and environmental barriers, and discharge planning with nursing, case manager, and therapies. Please see conference note from today as well.  2.  Antithrombotics: -DVT/anticoagulation: Subcutaneous heparin             -antiplatelet therapy: N/A 3. Pain Management: Tylenol as needed 4. Mood: Provide emotional support             -antipsychotic agents: Seroquel 25 mg twice daily   Ativan changed to Klonopin - continue to evaluate lethargy,?  Improving   Continue restraints for patient safety             -pt is fall risk 5. Neuropsych: This patient is not capable of making decisions on her own behalf.  Ritalin twice daily started on 2/2 with improvement, increased on 2/9, continue to monitor-decreased due to restlessness after discussion with therapies  Telesitter for safety 6. Skin/Wound Care: Routine skin checks 7. Fluids/Electrolytes/Nutrition: Routine in and outs  BMP within acceptable range on 2/13 8. Seizure prophylaxis.   Keppra decreased to 500 BID on 1/28, decreased further on 2/2, DC'd on 2/7 9. 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 Constance Holster.   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 Earleen Newport.       Dietary and speech therapy follow-up  Changed tube feeds to bolus feeds-discussed with dietary.  Advanced diet as tolerated 11. Hyperglycemia  related to tube feeds. SSI   CBG (last 3)  Recent Labs    05/14/20 0031 05/14/20 0426 05/14/20 0832  GLUCAP 113* 89 83   Slightly labile on 2/16 12. Hypothyroidism: Syndthroid 60. Hypertension. Hydralazine 50 mg every 8 hours, Normodyne 300 mg 3 times daily, Norvasc 10 mg daily   Vitals:   05/14/20 0316 05/14/20 0822  BP: (!) 115/58   Pulse: 70 91  Resp: 16 16  Temp: 98.6 F (37 C)   SpO2: 100% 98%   Controlled on 2/16  Monitor with increased mobility 13.  Acute blood loss anemia: Resolved  Hemoglobin 12.4 on 2/13  Continue to monitor 14. Slow transit constipation:   Improving 15.  Thrombocytopenia  Platelets 126 on 2/13, labs ordered for tomorrow  LOS: 22 days A FACE TO FACE EVALUATION WAS PERFORMED  Ixchel Duck Lorie Phenix 05/14/2020, 10:23 AM

## 2020-05-14 NOTE — Progress Notes (Addendum)
Occupational Therapy Session Note  Patient Details  Name: Stacy Moran MRN: [id number] Date of Birth: [birth year]  Today's Date: 05/14/2020 OT Individual Time: 1310-1345 OT Individual Time Calculation (min): 35 min  and Today's Date: 05/14/2020 OT Missed Time: 25 Minutes Missed Time Reason: Nursing care;Patient unwilling/refused to participate without medical reason (nursing care at beginning of session; pt refusing midway through session without reason.)   Short Term Goals: Week 3:  OT Short Term Goal 1 (Week 3): Pt will complete stand pivot transfers without grabbing at her lines and leads to demonstrate improved attention. OT Short Term Goal 2 (Week 3): Pt will demonstrate improved attention by bathing UB with no more than 4 cues to attend to task. OT Short Term Goal 3 (Week 3): Pt will don pants over feet with S and over hips with CGA.  Skilled Therapeutic Interventions/Progress Updates:    Pt sitting up in w/c, nursing present to provide necessary medical attention, therefore 10 minutes of treatment initially missed.  Pt then ambulated from w/c to bathroom for timed toileting efforts with bilateral hand held assist with therapist positioned in front of pt to reduce distraction.  Pt required CGA for balance.  Therapist had to pull brief and pants down over hips due to pt very distractible standing at toilet attempting to pull assist cord and running hand along grab bar despite max multimodal cues provided to stay on task.  Pt had continent episode of urine.  Pericare completed with setup and supervision.  Pt required min assist to initiate sit to stand for clothing mgt and pt able to pull pants and brief up with min assist.  Pt required max multimodal cues and min assist with bilateral hand held to ambulate to sink with significant distractibility and slight resistiveness to therapist assist efforts, but able to redirect and approach sink safely.  Pt required max multimodal cues to attend and  sequence through washing of hands in standing with CGA.  Stand to sit at w/c and provided encouragement and multimodal cues to initiate oral care with mouthswab.  Pt shaking head no and mouthing words "stop it".   Pt resistive to any further attempts to initiate oral care.  Thereafter, pt appearing with flat affect and looking away from therapist/non participatory in all therapeutic efforts including sit<>stand transfer and therapeutic activity including ball toss/catch. Discontinued session due to pt not participating.  Pt sitting up in w/c, call bell in reach, pelvic restraint on, seat belt alarm on, telesitter on.  Therapy Documentation Precautions:  Precautions Precautions: Fall Precaution Comments: trach 28%/5L; pelvic restraint in bed, PMSV with staff, very low voice quality Required Braces or Orthoses: Other Brace Other Brace: abdominal binder - for PEG Restrictions Weight Bearing Restrictions: No   Therapy/Group: Individual Therapy  Ezekiel Slocumb 05/14/2020, 1:52 PM

## 2020-05-14 NOTE — Progress Notes (Signed)
Speech Language Pathology Weekly Progress and Session Note  Patient Details  Name: Stacy Moran MRN: [id number] Date of Birth: 05/06/1955  Beginning of progress report period: 05/07/2020 End of progress report period: 05/14/2020  Today's Date: 05/14/2020 SLP Individual Time: 1520-1550 SLP Individual Time Calculation (min): 30 min  Short Term Goals: Week 3: SLP Short Term Goal 1 (Week 3): Pt will tolerate PMSV for 25 minutes without s/s of distress over x2 opportunites. SLP Short Term Goal 1 - Progress (Week 3): Met SLP Short Term Goal 2 (Week 3): Pt will demonstrate sustained attention in 3 minute intervals with mod A multimodal cues. SLP Short Term Goal 2 - Progress (Week 3): Not met SLP Short Term Goal 3 (Week 3): Pt will initate with extra time 1 step commands/basic problem solving tasks with min A multimodal cues in 70% of opportunties. SLP Short Term Goal 3 - Progress (Week 3): Not met SLP Short Term Goal 4 (Week 3): Pt will consume trials of ice chips with moderate s/s aspiration and reduced expeling of mucus secretions, prior to repeat MBS. SLP Short Term Goal 4 - Progress (Week 3): Not met SLP Short Term Goal 5 (Week 3): Pt will name objects at written word level with minimal spelling errors given min A verbal cues in 70% of opportunties. SLP Short Term Goal 5 - Progress (Week 3): Not met SLP Short Term Goal 6 (Week 3): Pt will attempt phonation in 3 out 5 opportunties with mod A multimodal cues. SLP Short Term Goal 6 - Progress (Week 3): Not progressing    New Short Term Goals: Week 4: SLP Short Term Goal 1 (Week 4): Patient will attend to basic level functional task for increments of 60-90 seconds with modA cues to redirect SLP Short Term Goal 2 (Week 4): Patient will perform basic level functional tasks (brush teeth, wash hands, etc) with modA for attention and initiation. SLP Short Term Goal 3 (Week 4): Patient will write names of basic level objects, naming at least 5  different objects/object pictures in a session with modA SLP Short Term Goal 4 (Week 4): Patient will tolerate trials of ice chips with minimal overt s/s aspiration for determination of readiness for repeat MBS  Weekly Progress Updates:  Patient made little progress meeting 1/4 STG's with the goal she met of tolerating PMV a very passive goal. She continues to not adequately attempt phonation, has significantly impaired attention and will at times appear to actively refuse to participate. During team meeting today, we discussed asking daughter if she would be able to attend more therapy and nursing sessions due to extensive education that will need to be completed.   Intensity: Minumum of 1-2 x/day, 30 to 90 minutes Frequency: 3 to 5 out of 7 days Duration/Length of Stay: 2/23 Treatment/Interventions: Cognitive remediation/compensation;Cueing hierarchy;Dysphagia/aspiration precaution training;Functional tasks;Internal/external aids;Speech/Language facilitation;Patient/family education   Daily Session  Skilled Therapeutic Interventions: Patient seen to address cognitive linguistic goals with focus of session on basic level language and attention tasks. Patient was very fidgety and distracted, constantly grabbing TV remote and turning TV on requiring SLP to repeatedly turn off and redirect. She was able to write word of object picture. (cat) but on subsequent items, she seemed to ignore, not be able to attend or would start writing letter combinations that did not form real words. She seemed to get agitated by SLP, grabbing remote on bed, turning TV on and turning volume up loudly and then pushing nurse call button. Patient continues to benefit  from skilled SLP intervention to maximize cognitive-linguistic, voice and swallow function prior to discharge.     General    Pain Pain Assessment Pain Scale: Faces Pain Score: 0-No pain Faces Pain Scale: No hurt  Therapy/Group: Individual  Therapy  Sonia Baller, MA, CCC-SLP Speech Therapy

## 2020-05-15 LAB — CBC WITH DIFFERENTIAL/PLATELET
Abs Immature Granulocytes: 0.03 10*3/uL (ref 0.00–0.07)
Basophils Absolute: 0 10*3/uL (ref 0.0–0.1)
Basophils Relative: 0 %
Eosinophils Absolute: 0 10*3/uL (ref 0.0–0.5)
Eosinophils Relative: 1 %
HCT: 39.7 % (ref 36.0–46.0)
Hemoglobin: 12.1 g/dL (ref 12.0–15.0)
Immature Granulocytes: 0 %
Lymphocytes Relative: 25 %
Lymphs Abs: 1.7 10*3/uL (ref 0.7–4.0)
MCH: 26.8 pg (ref 26.0–34.0)
MCHC: 30.5 g/dL (ref 30.0–36.0)
MCV: 87.8 fL (ref 80.0–100.0)
Monocytes Absolute: 0.5 10*3/uL (ref 0.1–1.0)
Monocytes Relative: 8 %
Neutro Abs: 4.6 10*3/uL (ref 1.7–7.7)
Neutrophils Relative %: 66 %
Platelets: 147 10*3/uL — ABNORMAL LOW (ref 150–400)
RBC: 4.52 MIL/uL (ref 3.87–5.11)
RDW: 14.7 % (ref 11.5–15.5)
WBC: 6.9 10*3/uL (ref 4.0–10.5)
nRBC: 0 % (ref 0.0–0.2)

## 2020-05-15 LAB — GLUCOSE, CAPILLARY
Glucose-Capillary: 100 mg/dL — ABNORMAL HIGH (ref 70–99)
Glucose-Capillary: 109 mg/dL — ABNORMAL HIGH (ref 70–99)
Glucose-Capillary: 122 mg/dL — ABNORMAL HIGH (ref 70–99)
Glucose-Capillary: 125 mg/dL — ABNORMAL HIGH (ref 70–99)
Glucose-Capillary: 127 mg/dL — ABNORMAL HIGH (ref 70–99)
Glucose-Capillary: 129 mg/dL — ABNORMAL HIGH (ref 70–99)

## 2020-05-15 MED ORDER — GLYCOPYRROLATE 1 MG PO TABS
2.0000 mg | ORAL_TABLET | Freq: Two times a day (BID) | ORAL | Status: DC
  Administered 2020-05-15 – 2020-05-21 (×12): 2 mg via ORAL
  Filled 2020-05-15 (×13): qty 2

## 2020-05-15 NOTE — Progress Notes (Signed)
SLP Cancellation Note  Patient Details Name: Stacy Moran MRN: [id number] DOB: Aug 04, 1955   Cancelled treatment:        Patient in bed with eyes closed by easily alerted, she shook head, would not open eyes or respond to any stimuli. She had refused earlier PT and OT sessions.  Sonia Baller, [place], CCC-SLP Speech Therapy                                                                                                Nadara Mode Tarrell 05/15/2020, 2:29 PM

## 2020-05-15 NOTE — Progress Notes (Signed)
Physical Therapy Session Note  Patient Details  Name: Stacy Moran MRN: [id number] Date of Birth: [birth year]  Today's Date: 05/15/2020 PT Individual Time: 1455-1535 PT Individual Time Calculation (min): 40 min   Short Term Goals: Week 4:  PT Short Term Goal 1 (Week 4): STG = LTG due to ELOS  Skilled Therapeutic Interventions/Progress Updates:     Patient in bed asleep on 28% FiO2 via trach collar, vitals WNL, and soft wrist restraints in place upon PT arrival. Patient difficult to arouse with verbal and tactile stimulation. Discussed with RN and lead PT who report patient has been lethargic throughout the day with limited participation in therapies, did report that the patient had not voided today and suggested offering toileting.   Patient with significant congestion with unproductive cough at beginning of session. Patient with productive cough in sitting and lying following mobility with copious amounts of opaque white/yellow mucous. Provided suctioning as needed during session. RN made aware and in agreement to perform deep suctioning for improved congestion after session.   Therapeutic Activity: Bed Mobility: B wrist restraints removed for mobility. Patient performed supine to/ sit with max +2 due to lethargy, patient remained asleep in sitting initially. Provided noxious stimulation and A/P trunk rocking with intermittent arousal from patient progressing to arousal with her eyes closed sitting EOB with supervision. Provided patient with wash cloths in B hands to reduce grabbing at lines/lead in sitting. Patient sat EOB with minimal response to yes/no questions and only opened her eyes x2 >4 min. She performed sit to supine with mod A for initiation, as patient declined lying down or sitting in the w/c shaking her head no. Facilitated increased cervical ROM with shaking her head x4 before facilitating patient to lie down in the bed.  Transfers: Patient performed sit to/from stand x2 with max  a +2 with minimal initiation from patient and poor hip/knee/trunk extension in standing, on third trial the patient stood with min A +2 for safety using B HHA. Patient initiated stand pivot to [facility], however, when therapist assisted with pulling her pants down, she persisted to pull them back up without sitting on the [facility]. Returned to sitting EOB with mod A +2. Patient nodded yes when asked if she needed to go to the bathroom. Patient agreeable to ambulate to the toilet. Patient performed ambulatory transfer bed>toilet, ~12 feet, with min A +2 with 1 person in front leading patient and the other behind incase of spontaneous sitting by patient, which did not occur. Patient performed toilet transfer with min a +2 for lower body clothing management and safety. Patient attempted to assist with lower body clothing management, but was unable to manage due to poor awareness/attention with task. Patient performed peri-care with set-up assist. She then ambulated to the sink to perform hand hygiene. Patient unable to follow cues to turn on the water, locate the soap, and locate the paper towels. Performed hand hygiene with max cues and set-up assist. Patient then ambulated back to bed, as above. She ambulated with crouched shuffling gait throughout Provided verbal cues for erect posture and initiation and path finding. Patient with her eyes closed with all tasks with intermittent opening of R eye.   Patient in bed with B soft wrist restraints in place, trach collar providing 28% FiO2, and vitals WNL at end of session with breaks locked, bed alarm set, and all needs within reach.    Therapy Documentation Precautions:  Precautions Precautions: Fall Precaution Comments: trach 28%/5L; pelvic restraint in bed, PMSV  with staff, very low voice quality Required Braces or Orthoses: Other Brace Other Brace: abdominal binder - for PEG Restrictions Weight Bearing Restrictions: No   Therapy/Group: Individual  Therapy  Stacy Moran Stacy Moran PT, DPT  05/15/2020, 5:16 PM

## 2020-05-15 NOTE — Progress Notes (Signed)
Physical Therapy Session Note  Patient Details  Name: Stacy Moran MRN: [id number] Date of Birth: 1955-05-03  Today's Date: 05/15/2020 PT Individual Time: 1300-1315 PT Individual Time Calculation (min): 15 min   Short Term Goals: Week 4:  PT Short Term Goal 1 (Week 4): STG = LTG due to ELOS  Skilled Therapeutic Interventions/Progress Updates:    Pt received sleeping in bed, bilateral wrist restraints on, pt on 4L wall O2 with 28% FIO2 humidified, via trach mask. Pt sleeping but awakens easily to voice and then immediately shuts her eyes. Attempted multiple times to encourage her to participate with therapy as she has refused all morning therapies and multiple times during this week (see notes). Patient smiling when asked to participate but continued to refuse and no effort made. Repositioning in bed with totalA and HOB fully elevated for pulmonary toileting and upright tolerance. Bed alarm on, bed lowered, bed rails up, wrist restraints on, needs within reach, RN made aware. Pt missed 45 minutes of skilled therapy due to refusal.  Therapy Documentation Precautions:  Precautions Precautions: Fall Precaution Comments: trach 28%/5L; pelvic restraint in bed, PMSV with staff, very low voice quality Required Braces or Orthoses: Other Brace Other Brace: abdominal binder - for PEG Restrictions Weight Bearing Restrictions: No General: PT Amount of Missed Time (min): 45 Minutes PT Missed Treatment Reason: Patient unwilling to participate  Therapy/Group: Individual Therapy  Thaer Miyoshi P Trentan Trippe PT 05/15/2020, 7:36 AM

## 2020-05-15 NOTE — Progress Notes (Signed)
[place] PHYSICAL MEDICINE & REHABILITATION PROGRESS NOTE  Subjective/Complaints: Patient seen laying in bed this morning.  She is more slowed.  Notable secretions.  Discussed with nursing.  ROS: Unreliable due to cognition.  Objective: Vital Signs: Blood pressure 138/78, pulse 97, temperature 98.5 F (36.9 C), temperature source Oral, resp. rate 18, height 5\' 9"  (1.753 m), weight 59.3 kg, SpO2 95 %. No results found. Recent Labs    05/15/20 0505  WBC 6.9  HGB 12.1  HCT 39.7  PLT 147*   No results for input(s): NA, K, CL, CO2, GLUCOSE, BUN, CREATININE, CALCIUM in the last 72 hours. No intake or output data in the 24 hours ending 05/15/20 1215      Physical Exam: BP 138/78 (BP Location: Left Arm)   Pulse 97   Temp 98.5 F (36.9 C) (Oral)   Resp 18   Ht 5\' 9"  (1.753 m)   Wt 59.3 kg   SpO2 95%   BMI 19.31 kg/m   Constitutional: No distress . Vital signs reviewed. HENT: Normocephalic.  Atraumatic. Neck: + #6 trach with trach collar thin secretions Eyes: Right eye EOMI. No discharge. Cardiovascular: No JVD.  RRR. Respiratory: Normal effort.  No stridor.  Bilateral clear to auscultation. GI: Non-distended.  BS +.  + PEG Skin: Warm and dry.  Intact. Psych: Limited due to cognition, but appears slower. Musc: No edema in extremities.  No tenderness in extremities. Neuro: Alert Motor: 4+-5/5 throughout, appears unchanged  Assessment/Plan: 1. Functional deficits which require 3+ hours per day of interdisciplinary therapy in a comprehensive inpatient rehab setting.  Physiatrist is providing close team supervision and 24 hour management of active medical problems listed below.  Physiatrist and rehab team continue to assess barriers to discharge/monitor patient progress toward functional and medical goals   Care Tool:  Bathing    Body parts bathed by patient: Right arm,Left arm,Chest,Abdomen,Right upper leg,Left upper leg,Right lower leg,Left lower leg,Face,Front  perineal area,Buttocks   Body parts bathed by helper: Right arm,Left arm,Chest,Abdomen     Bathing assist Assist Level: Maximal Assistance - Patient 24 - 49%     Upper Body Dressing/Undressing Upper body dressing   What is the patient wearing?: Pull over shirt    Upper body assist Assist Level: Maximal Assistance - Patient 25 - 49%    Lower Body Dressing/Undressing Lower body dressing      What is the patient wearing?: Incontinence brief,Pants     Lower body assist Assist for lower body dressing: Moderate Assistance - Patient 50 - 74%     Toileting Toileting Toileting Activity did not occur Landscape architect and hygiene only): Refused  Toileting assist Assist for toileting: Moderate Assistance - Patient 50 - 74%     Transfers Chair/bed transfer  Transfers assist     Chair/bed transfer assist level: Minimal Assistance - Patient > 75%     Locomotion Ambulation   Ambulation assist      Assist level: 2 helpers Assistive device: Hand held assist Max distance: 300'   Walk 10 feet activity   Assist  Walk 10 feet activity did not occur: Refused  Assist level: 2 helpers Assistive device: Hand held assist   Walk 50 feet activity   Assist Walk 50 feet with 2 turns activity did not occur: Refused  Assist level: 2 helpers Assistive device: Hand held assist    Walk 150 feet activity   Assist Walk 150 feet activity did not occur: Refused  Assist level: 2 helpers Assistive device: Hand  held assist    Walk 10 feet on uneven surface  activity   Assist Walk 10 feet on uneven surfaces activity did not occur: Safety/medical concerns         Wheelchair     Assist Will patient use wheelchair at discharge?: Yes Type of Wheelchair: Manual Wheelchair activity did not occur: Refused         Wheelchair 50 feet with 2 turns activity    Assist            Wheelchair 150 feet activity     Assist          Medical Problem  List and Plan: 1. 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  Continue CIR 2.  Antithrombotics: -DVT/anticoagulation: Subcutaneous heparin             -antiplatelet therapy: N/A 3. Pain Management: Tylenol as needed 4. Mood: Provide emotional support             -antipsychotic agents: Seroquel 25 mg twice daily   Ativan changed to Klonopin - continue to evaluate lethargy   Continue restraints for patient safety             -pt is fall risk 5. Neuropsych: This patient is not capable of making decisions on her own behalf.  Ritalin twice daily started on 2/2 with improvement, increased on 2/9, continue to monitor-decreased due to restlessness after discussion with therapies-?  Increase in slowing  Telesitter for safety 6. Skin/Wound Care: Routine skin checks 7. Fluids/Electrolytes/Nutrition: Routine in and outs  BMP within acceptable range on 2/13 8. Seizure prophylaxis.   Keppra decreased to 500 BID on 1/28, decreased further on 2/2, DC'd on 2/7 9. 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 Constance Holster.   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 Earleen Newport.       Dietary and speech therapy follow-up  Changed tube feeds to bolus feeds-discussed with dietary.  Advanced diet as tolerated 11. Hyperglycemia related to tube feeds. SSI   CBG (last 3)  Recent Labs    05/15/20 0415 05/15/20 0756 05/15/20 1208  GLUCAP 100* 109* 127*   Slightly elevated on 2/17 12.  Hypothyroidism: Syndthroid 64. Hypertension. Hydralazine 50 mg every 8 hours, Normodyne 300 mg 3 times daily, Norvasc 10 mg daily   Vitals:   05/15/20 0413 05/15/20 0822  BP: 138/78   Pulse: 86 97  Resp: 19 18  Temp: 98.5 F (36.9 C)   SpO2: 97% 95%   Controlled on 2/17  Monitor with increased mobility 13.  Acute blood loss anemia: Resolved  Hemoglobin 12.4 on 2/13  Continue to monitor 14. Slow transit constipation:   Improving 15.  Thrombocytopenia  Platelets 147 on 2/17  Continue to monitor  LOS: 23 days A FACE TO FACE EVALUATION WAS PERFORMED  Flor Whitacre Lorie Phenix 05/15/2020, 12:15 PM

## 2020-05-15 NOTE — Progress Notes (Signed)
Occupational Therapy Session Note  Patient Details  Name: Stacy Moran MRN: [id number] Date of Birth: Jul 15, 1955  Today's Date: 05/15/2020 OT Individual Time: 1035-1105 OT Individual Time Calculation (min): 30 min  and Today's Date: 05/15/2020 OT Missed Time: 30 Minutes Missed Time Reason: Patient unwilling/refused to participate without medical reason   Short Term Goals: Week 3:  OT Short Term Goal 1 (Week 3): Pt will complete stand pivot transfers without grabbing at her lines and leads to demonstrate improved attention. OT Short Term Goal 2 (Week 3): Pt will demonstrate improved attention by bathing UB with no more than 4 cues to attend to task. OT Short Term Goal 3 (Week 3): Pt will don pants over feet with S and over hips with CGA.  Skilled Therapeutic Interventions/Progress Updates:    Pt received in bed asleep on humidified air through trach. +2 A to try numerous times for 30 minutes to have pt sit up.  Pt sat to EOB with TOTAL A as she was resisting, then layed back down.  Brought out clean clothing and tried numerous times to explain to pt that it was time to clean up and get dressed. She kept pulling covers over her and then therapist removed covers. Tried several times to have pt open eye and wake up and participate. Pt continually pushed therapist away.   Replaced wrist restraints, bed alarm set, telesitter on.  O2 sats consistently 96-99%.   Therapy Documentation Precautions:  Precautions Precautions: Fall Precaution Comments: trach 28%/5L; pelvic restraint in bed, PMSV with staff, very low voice quality Required Braces or Orthoses: Other Brace Other Brace: abdominal binder - for PEG Restrictions Weight Bearing Restrictions: No  General OT Amount of Missed Time: 30 Minutes Vital Signs: Therapy Vitals Pulse Rate: 97 Resp: 18 Patient Position (if appropriate): Lying Oxygen Therapy SpO2: 95 % O2 Device: Tracheostomy Collar O2 Flow Rate (L/min): 5 L/min FiO2 (%):  21 % Pain: Pain Assessment Faces Pain Scale: No hurt     Therapy/Group: Individual Therapy  [place] 05/15/2020, 11:18 AM

## 2020-05-16 LAB — GLUCOSE, CAPILLARY
Glucose-Capillary: 102 mg/dL — ABNORMAL HIGH (ref 70–99)
Glucose-Capillary: 103 mg/dL — ABNORMAL HIGH (ref 70–99)
Glucose-Capillary: 111 mg/dL — ABNORMAL HIGH (ref 70–99)
Glucose-Capillary: 117 mg/dL — ABNORMAL HIGH (ref 70–99)
Glucose-Capillary: 129 mg/dL — ABNORMAL HIGH (ref 70–99)
Glucose-Capillary: 88 mg/dL (ref 70–99)
Glucose-Capillary: 91 mg/dL (ref 70–99)

## 2020-05-16 NOTE — Progress Notes (Signed)
Occupational Therapy Weekly Progress Note  Patient Details  Name: Stacy Moran MRN: [id number] Date of Birth: 1955/10/27  Beginning of progress report period: May 09, 2020 End of progress report period: May 16, 2020  Today's Date: 05/16/2020 OT Individual Time: 1305-1400 OT Individual Time Calculation (min): 55 min    Patient has met 1 of 4 short term goals.  Pt had a good session performing at short term goal level today, however pt's performance and participation has fluctuated greatly overall this week secondary to cognitive and behavioral barriers.  Pt at times resisted all of OT's therapeutic efforts or was too lethargic to fully participate. Pt also exhibited significant bouts of restlessness and distractibility limiting her ability to follow directions and requiring max assist +2 to complete simple self care/mobility tasks.  Nursing has reported that pt has been more awake and restless in the evening causing pt to sleep more during the day. OT has collaborated with interdisciplinary team to reduce barriers and to improve pt's overall rehab potential.   Pt significantly less distracted and more participatory today and able to attend to self care tasks consistently with step by step cues requiring only min assist to CGA (needing +2 only for portable O2 tank management). Also noted with improved ability to communicate needs to therapist today.  Pt's daughter has attended one OT session already for caregiver education and will benefit from additional education to prepare her for pts likely fluctuating performance during self care and functional mobility.  Daughter works full time during the wee  (will be taking FMLA upon pt dc to home), but plans to attend weekend OT session for further training.    Patient continues to demonstrate the following deficits: muscle weakness, decreased cardiorespiratoy endurance and decreased oxygen support, impaired timing and sequencing, motor apraxia,  decreased coordination and decreased motor planning, decreased visual perceptual skills, decreased attention to left, decreased initiation, decreased attention, decreased awareness, decreased problem solving, decreased safety awareness, decreased memory and delayed processing and decreased sitting balance, decreased standing balance and decreased balance strategies and therefore will continue to benefit from skilled OT intervention to enhance overall performance with BADL.  Patient progressing toward long term goals..  Continue plan of care.  OT Short Term Goals Week 3:  OT Short Term Goal 1 (Week 3): Pt will complete stand pivot transfers without grabbing at her lines and leads to demonstrate improved attention. OT Short Term Goal 1 - Progress (Week 3): Met OT Short Term Goal 2 (Week 3): Pt will demonstrate improved attention by bathing UB with no more than 4 cues to attend to task. OT Short Term Goal 2 - Progress (Week 3): Not met OT Short Term Goal 3 (Week 3): Pt will don pants over feet with S and over hips with CGA. OT Short Term Goal 3 - Progress (Week 3): Not met Week 4:  OT Short Term Goal 1 (Week 4): STGs=LTGs due to ELOS  Skilled Therapeutic Interventions/Progress Updates:    Pt sitting up in w/c, more alert and responsive to therapist today, able to write for communication on dry erase board that "I want to suction".  Pt on 4L O2 at 28% humidified air via trach collar throughout session.  OT provided dependent assist to externally suction mucous intermittently throughout session as needed.  Pt also indicating that by mouthing words that she needs to go to the bathroom.  Pt ambulated from w/c at bedside to toilet in bathroom with BUE hand held support with CGA and +2  to manage portable O2 tank.  Pt completed clothing mgt with min assist and max multimodal cueing.  Pt had continent episode of urine.  Pericare completed with min assist to ensure thoroughness.  Pt ambulated from bathroom to sink  with BUE hand held/CGA and +2 for O2 tank mgt.  Pt washed hands and brushed teeth in standing (provided oral swab to rinse mouth) with min assist.  Pt completed stand to sit with min assist for initiation.  UB/LB bathing and dressing completed with min assist and mod multimodal cueing overall.   At end of session, pt sitting up in w/c, call bell in reach, pelvic restraint donned, seat alarm on, telesitter on.    Therapy Documentation Precautions:  Precautions Precautions: Fall Precaution Comments: trach 28%/5L; pelvic restraint in bed, PMSV with staff, very low voice quality Required Braces or Orthoses: Other Brace Other Brace: abdominal binder - for PEG Restrictions Weight Bearing Restrictions: No   Therapy/Group: Individual Therapy  Ezekiel Slocumb 05/16/2020, 3:08 PM

## 2020-05-16 NOTE — Progress Notes (Addendum)
Patient ID: Stacy Moran, female   DOB: 1955-09-22, 66 y.o.   MRN: [id number] Reached out to [facility] who will be the charity home health agency next week to se eif could provide HHRN, SP and PT to pt. [facility] reports since the trach is new they can not see her and declined her. Pt will not have any home health upon discharge from rehab. Will inform daughter of this.  3:09 PM Asked RN-[place] to make sure weekend RN to educate daughter when here on Sat Peg feedings and suctioning trach. Have asked daughter to come in during the week and may need to do trach teaching Wed when here for discharge. See how educate goes on Sat

## 2020-05-16 NOTE — Progress Notes (Addendum)
PHYSICAL MEDICINE & REHABILITATION PROGRESS NOTE  Subjective/Complaints: Patient seen laying in bed this AM.  She indicates the slept well overnight.  Is currently coughing up secretions.  Discussed with nursing who just suctioned dry, however she continues to have copious secretions.  ROS: Unreliable due to cognition.  Objective: Vital Signs: Blood pressure 128/77, pulse 84, temperature 98.4 F (36.9 C), temperature source Oral, resp. rate 18, height 5\' 9"  (1.753 m), weight 59.3 kg, SpO2 94 %. No results found. Recent Labs    05/15/20 0505  WBC 6.9  HGB 12.1  HCT 39.7  PLT 147*   No results for input(s): NA, K, CL, CO2, GLUCOSE, BUN, CREATININE, CALCIUM in the last 72 hours.  Intake/Output Summary (Last 24 hours) at 05/16/2020 0855 Last data filed at 05/16/2020 9937 Gross per 24 hour  Intake 0 ml  Output --  Net 0 ml        Physical Exam: BP 128/77 (BP Location: Left Arm)   Pulse 84   Temp 98.4 F (36.9 C) (Oral)   Resp 18   Ht 5\' 9"  (1.753 m)   Wt 59.3 kg   SpO2 94%   BMI 19.31 kg/m   Constitutional: Distressed due to coughing and secretions.  Vital signs reviewed. HENT: Normocephalic.  Atraumatic. Neck: #6 trach with trach collar with copious secretions Eyes: Right eye EOMI. No discharge. Cardiovascular: No JVD.  RRR. Respiratory: Normal effort.  No stridor.  Bilateral clear to auscultation. GI: Non-distended.  BS +.  + PEG. Skin: Warm and dry.  Intact. Psych: Limited due to cognition Musc: No edema in extremities.  No tenderness in extremities. Neuro: Alert Motor: 4+-5/5 throughout, appears stable  Assessment/Plan: 1. Functional deficits which require 3+ hours per day of interdisciplinary therapy in a comprehensive inpatient rehab setting.  Physiatrist is providing close team supervision and 24 hour management of active medical problems listed below.  Physiatrist and rehab team continue to assess barriers to discharge/monitor patient progress  toward functional and medical goals   Care Tool:  Bathing    Body parts bathed by patient: Right arm,Left arm,Chest,Abdomen,Right upper leg,Left upper leg,Right lower leg,Left lower leg,Face,Front perineal area,Buttocks   Body parts bathed by helper: Right arm,Left arm,Chest,Abdomen     Bathing assist Assist Level: Maximal Assistance - Patient 24 - 49%     Upper Body Dressing/Undressing Upper body dressing   What is the patient wearing?: Pull over shirt    Upper body assist Assist Level: Maximal Assistance - Patient 25 - 49%    Lower Body Dressing/Undressing Lower body dressing      What is the patient wearing?: Incontinence brief,Pants     Lower body assist Assist for lower body dressing: Moderate Assistance - Patient 50 - 74%     Toileting Toileting Toileting Activity did not occur Landscape architect and hygiene only): Refused  Toileting assist Assist for toileting: Moderate Assistance - Patient 50 - 74%     Transfers Chair/bed transfer  Transfers assist     Chair/bed transfer assist level: Minimal Assistance - Patient > 75%     Locomotion Ambulation   Ambulation assist      Assist level: 2 helpers Assistive device: Hand held assist Max distance: 300'   Walk 10 feet activity   Assist  Walk 10 feet activity did not occur: Refused  Assist level: 2 helpers Assistive device: Hand held assist   Walk 50 feet activity   Assist Walk 50 feet with 2 turns activity did not occur:  Refused  Assist level: 2 helpers Assistive device: Hand held assist    Walk 150 feet activity   Assist Walk 150 feet activity did not occur: Refused  Assist level: 2 helpers Assistive device: Hand held assist    Walk 10 feet on uneven surface  activity   Assist Walk 10 feet on uneven surfaces activity did not occur: Safety/medical concerns         Wheelchair     Assist Will patient use wheelchair at discharge?: Yes Type of Wheelchair:  Manual Wheelchair activity did not occur: Refused         Wheelchair 50 feet with 2 turns activity    Assist            Wheelchair 150 feet activity     Assist          Medical Problem List and Plan: 1. 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  Continue CIR 2.  Antithrombotics: -DVT/anticoagulation: Subcutaneous heparin             -antiplatelet therapy: N/A 3. Pain Management: Tylenol as needed 4. Mood: Provide emotional support             -antipsychotic agents: Seroquel 25 mg twice daily   Ativan changed to Klonopin - continue to evaluate lethargy   Continue restraints for patient safety             -pt is fall risk 5. Neuropsych: This patient is not capable of making decisions on her own behalf.  Ritalin twice daily started on 2/2 with improvement, increased on 2/9, continue to monitor-decreased due to restlessness after discussion with therapies  [name] for safety 6. Skin/Wound Care: Routine skin checks 7. Fluids/Electrolytes/Nutrition: Routine in and outs  BMP within acceptable range on 2/13 8. Seizure prophylaxis.   Keppra decreased to 500 BID on 1/28, decreased further on 2/2, DC'd on 2/7 9. 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 Constance Holster.   Continues on #6 cuffless Shiley currently, per ENT, holding on downsize d/t secretions.   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 Earleen Newport.       Dietary and speech therapy follow-up  Changed tube feeds to  bolus feeds-discussed with dietary.  Advanced diet as tolerated 11. Hyperglycemia related to tube feeds. SSI   CBG (last 3)  Recent Labs    05/16/20 0418 05/16/20 0420 05/16/20 0800  GLUCAP 103* 88 102*   Controlled on 2/18 12. Hypothyroidism: Syndthroid 37. Hypertension. Hydralazine 50 mg every 8 hours, Normodyne 300 mg 3 times daily, Norvasc 10 mg daily   Vitals:   05/16/20 0415 05/16/20 0838  BP: 128/77   Pulse: 76 84  Resp: 16 18  Temp: 98.4 F (36.9 C)   SpO2: 94% 94%   Controlled on 2/18  Monitor with increased mobility 13.  Acute blood loss anemia: Resolved  Hemoglobin 12.4 on 2/13  Continue to monitor 14. Slow transit constipation:   Improving 15.  Thrombocytopenia  Platelets 147 on 2/17, labs ordered for Monday   Continue to monitor  LOS: 24 days A FACE TO FACE EVALUATION WAS PERFORMED  Danis Pembleton Lorie Phenix 05/16/2020,  8:55 AM

## 2020-05-16 NOTE — Progress Notes (Signed)
Speech Language Pathology Daily Session Note  Patient Details  Name: Stacy Moran MRN: [id number] Date of Birth: Nov 06, 1955  Today's Date: 05/16/2020 SLP Individual Time: 1105-1200 SLP Individual Time Calculation (min): 55 min  Short Term Goals: Week 4: SLP Short Term Goal 1 (Week 4): Patient will attend to basic level functional task for increments of 60-90 seconds with modA cues to redirect SLP Short Term Goal 2 (Week 4): Patient will perform basic level functional tasks (brush teeth, wash hands, etc) with modA for attention and initiation. SLP Short Term Goal 3 (Week 4): Patient will write names of basic level objects, naming at least 5 different objects/object pictures in a session with modA SLP Short Term Goal 4 (Week 4): Patient will tolerate trials of ice chips with minimal overt s/s aspiration for determination of readiness for repeat MBS  Skilled Therapeutic Interventions:   Patient seen for skilled st session focusing on cognitive-linguistic goals. Patient sitting up in wheelchair and was alert upon SLP entering room. SLP placed PMV on patient for 25 minutes without noticeable changes in vitals. She had one instance halfway through session where she closed her eyes for several minutes but then was alert again to complete session. After maximal cues to direct and redirect attention, she was able to initiate and maintain attention to brush her teeth with toothbrush. She then orally expectorated white, thick frothy secretions. (she had wiped off majority of toothpaste on toothbrush). She participated in task of retrieving colored soft large Lego type bricks and able to get correct color without difficulty. She then required maximal A cues for attention and following SLP demonstration, modeling of placing bricks. Patient continues to benefit from skilled SLP intervention to maximize cognitive-linguistic, speech and swallow function prior to discharge.  Pain Pain Assessment Pain Scale:  Faces Faces Pain Scale: No hurt  Therapy/Group: Individual Therapy   Sonia Baller, MA, CCC-SLP Speech Therapy

## 2020-05-16 NOTE — Progress Notes (Signed)
Physical Therapy Session Note  Patient Details  Name: Dereka Lueras MRN: [id number] Date of Birth: [birth year]  Today's Date: 05/16/2020 PT Individual Time: 0900-0955 PT Individual Time Calculation (min): 55 min   Short Term Goals: Week 4:  PT Short Term Goal 1 (Week 4): STG = LTG due to ELOS  Skilled Therapeutic Interventions/Progress Updates:    Pt received supine in bed, awake and agreeable to therapy with minimal effort. She denies pain. Removed bilateral wrist restraints and patient on 5L wall O2 with 28% FiO2 humidified. Asked patient orientation questions and instructed to write on the whiteboard: She wrote "Tuesday" , "22222", and "Feb" (required cues for month - Valentine's day is in this month). Also educated patient on upcoming DC date home next week and she appears to be excited and shakes head 'yes' when ask if she's ready. Supine<>sit with minA, primarily just for initiation. Able to sit EOB with supervision and needed external suctioning via yaunker due to thick secretions with cough cues needed. Stand<>pivot transfer with CGA from EOB to w/c. Provided her with Vaseline for lips as they were very chapped/cracked, patient appreciative. W/c transport for time management to main hallway and focus of remaining session on gait training and stair training. Gait 178ft with bilateral hand held assist with therapist facing patient and holding each other's elbows, +2 for w/c follow and line management. Cues for safety awareness, forward gaze, upright posture. After gait trial, patient with thick copious secretions from trach site and required return to room to provide external suction. 2nd gait trial of 132ft with playing cards placed along both sides of the hallway and instructed patient on identification and retrieval. She required max verbal cues for locating all of the cards and gait facilitated in same manner as above with +2 assist. Stair training, navigated up/down x4 steps with bilateral hand  rails via self selected reciprocal pattern, requiring modA for steadying and max cues for safety awareness. Pt returned to her room and RN agreeable to leave patient OOB in the w/c. Pelvic restraint and safety belt alarm on, reconnected to humidified air, needs within reach, RN made aware.  Therapy Documentation Precautions:  Precautions Precautions: Fall Precaution Comments: trach 28%/5L; pelvic restraint in bed, PMSV with staff, very low voice quality Required Braces or Orthoses: Other Brace Other Brace: abdominal binder - for PEG Restrictions Weight Bearing Restrictions: No  Therapy/Group: Individual Therapy  [place] Surges P Ely Ballen PT 05/16/2020, 7:30 AM

## 2020-05-16 NOTE — Progress Notes (Signed)
Trach care done. Pt tolerated well. RN will continue to monitor.

## 2020-05-17 LAB — GLUCOSE, CAPILLARY
Glucose-Capillary: 103 mg/dL — ABNORMAL HIGH (ref 70–99)
Glucose-Capillary: 136 mg/dL — ABNORMAL HIGH (ref 70–99)
Glucose-Capillary: 138 mg/dL — ABNORMAL HIGH (ref 70–99)
Glucose-Capillary: 147 mg/dL — ABNORMAL HIGH (ref 70–99)
Glucose-Capillary: 80 mg/dL (ref 70–99)
Glucose-Capillary: 98 mg/dL (ref 70–99)

## 2020-05-17 NOTE — Plan of Care (Signed)
  Problem: RH SAFETY Goal: RH STG ADHERE TO SAFETY PRECAUTIONS W/ASSISTANCE/DEVICE Description: STG Adhere to Safety Precautions With  cues/reminders Assistance/Device. Outcome: Not Progressing   Problem: RH KNOWLEDGE DEFICIT Goal: RH STG INCREASE KNOWLEDGE OF DIABETES Description: Patient and daughter will be able to manage DM with medications and dietary modifications using handouts and education materials Outcome: Not Progressing   Problem: RH KNOWLEDGE DEFICIT Goal: RH STG INCREASE KNOWLEDGE OF HYPERTENSION Description: Patient and daughter will be able to manage HTN with medications and dietary modifications using handouts and education materials Outcome: Not Progressing   Problem: RH KNOWLEDGE DEFICIT Goal: RH STG INCREASE KNOWLEDGE OF DYSPHAGIA/FLUID INTAKE Description: Patient and daughter will be able to manage dysphagia with medications and dietary modifications using handouts and education materials Outcome: Not Progressing

## 2020-05-17 NOTE — Progress Notes (Signed)
[facility] PHYSICAL MEDICINE & REHABILITATION PROGRESS NOTE  Subjective/Complaints: No complaints this morning As per Marjorie Smolder RN patient was very agitated this morning- she helped with environmental modifications to calm patient down and patient was calm when I saw her.   ROS: Unreliable due to cognition.  Objective: Vital Signs: Blood pressure 129/73, pulse 82, temperature 99 F (37.2 C), temperature source Oral, resp. rate 20, height 5\' 9"  (1.753 m), weight 59.3 kg, SpO2 97 %. No results found. Recent Labs    05/15/20 0505  WBC 6.9  HGB 12.1  HCT 39.7  PLT 147*   No results for input(s): NA, K, CL, CO2, GLUCOSE, BUN, CREATININE, CALCIUM in the last 72 hours. No intake or output data in the 24 hours ending 05/17/20 0853      Physical Exam: BP 129/73   Pulse 82   Temp 99 F (37.2 C) (Oral)   Resp 20   Ht 5\' 9"  (1.753 m)   Wt 59.3 kg   SpO2 97%   BMI 19.31 kg/m   Gen: no distress, mildly distressed due to coughing and secretions.  HEENT: Right eye EOMI. #6 trach with trach collar with copious secretions Cardio: Reg rate Chest: normal effort, normal rate of breathing Abd: soft, + PEG. Ext: no edema Psych: Limited due to cognition Skin: intact Musc: No edema in extremities.  No tenderness in extremities. Neuro: Alert Motor: 4+-5/5 throughout, appears stable   Assessment/Plan: 1. Functional deficits which require 3+ hours per day of interdisciplinary therapy in a comprehensive inpatient rehab setting.  Physiatrist is providing close team supervision and 24 hour management of active medical problems listed below.  Physiatrist and rehab team continue to assess barriers to discharge/monitor patient progress toward functional and medical goals   Care Tool:  Bathing    Body parts bathed by patient: Right arm,Left arm,Chest,Abdomen,Right upper leg,Left upper leg,Right lower leg,Left lower leg,Face,Front perineal area,Buttocks   Body parts bathed by helper: Right  arm,Left arm,Chest,Abdomen     Bathing assist Assist Level: Minimal Assistance - Patient > 75%     Upper Body Dressing/Undressing Upper body dressing   What is the patient wearing?: Pull over shirt    Upper body assist Assist Level: Minimal Assistance - Patient > 75%    Lower Body Dressing/Undressing Lower body dressing      What is the patient wearing?: Incontinence brief,Pants     Lower body assist Assist for lower body dressing: Moderate Assistance - Patient 50 - 74%     Toileting Toileting Toileting Activity did not occur Landscape architect and hygiene only): Refused  Toileting assist Assist for toileting: Minimal Assistance - Patient > 75%     Transfers Chair/bed transfer  Transfers assist     Chair/bed transfer assist level: Minimal Assistance - Patient > 75%     Locomotion Ambulation   Ambulation assist      Assist level: 2 helpers Assistive device: Hand held assist Max distance: 200'   Walk 10 feet activity   Assist  Walk 10 feet activity did not occur: Refused  Assist level: 2 helpers Assistive device: Hand held assist   Walk 50 feet activity   Assist Walk 50 feet with 2 turns activity did not occur: Refused  Assist level: 2 helpers Assistive device: Hand held assist    Walk 150 feet activity   Assist Walk 150 feet activity did not occur: Refused  Assist level: 2 helpers Assistive device: Hand held assist    Walk 10 feet on uneven  surface  activity   Assist Walk 10 feet on uneven surfaces activity did not occur: Safety/medical concerns         Wheelchair     Assist Will patient use wheelchair at discharge?: Yes Type of Wheelchair: Manual Wheelchair activity did not occur: Refused         Wheelchair 50 feet with 2 turns activity    Assist            Wheelchair 150 feet activity     Assist          Medical Problem List and Plan: 1. 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  Continue CIR 2.  Antithrombotics: -DVT/anticoagulation: Continue Subcutaneous heparin             -antiplatelet therapy: N/A 3. Pain Management: Tylenol as needed 4. Mood: Provide emotional support             -antipsychotic agents: Seroquel 25 mg twice daily   Ativan changed to Klonopin - continue to evaluate lethargy   Renewed restraints for patient safety             -pt is fall risk 5. Neuropsych: This patient is not capable of making decisions on her own behalf.  Ritalin twice daily started on 2/2 with improvement, increased on 2/9, continue to monitor-decreased due to restlessness after discussion with therapies  [name] for safety 6. Skin/Wound Care: Routine skin checks 7. Fluids/Electrolytes/Nutrition: Routine in and outs  BMP within acceptable range on 2/13 8. Seizure prophylaxis.   Keppra decreased to 500 BID on 1/28, decreased further on 2/2, DC'd on 2/7 9. 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 Constance Holster.   Continues on #6 cuffless Shiley currently, per ENT, holding on downsize d/t secretions.   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 Earleen Newport.       Dietary and speech therapy follow-up  Changed tube feeds to bolus feeds-discussed with dietary.  Advanced diet as tolerated 11. Hyperglycemia related to tube feeds. SSI   CBG (last 3)  Recent Labs    05/17/20 0009 05/17/20 0432 05/17/20 0817  GLUCAP 138* 80 98   Controlled on 2/19- continue to monitor.  12.  Hypothyroidism: Syndthroid 13. Hypertension. Hydralazine 50 mg every 8 hours, Normodyne 300 mg 3 times daily, Norvasc 10 mg daily   Vitals:   05/17/20 0200 05/17/20 0324  BP:  129/73  Pulse: 83 82  Resp: 20   Temp:  99 F (37.2 C)  SpO2: 93% 97%   Controlled on 2/19- continue regimen.   Monitor with increased mobility 13.  Acute blood loss anemia: Resolved  Hemoglobin 12.4 on 2/13  Continue to monitor 14. Slow transit constipation:   Improving 15.  Thrombocytopenia  Platelets 147 on 2/17, labs ordered for Monday   Continue to monitor  LOS: 25 days A FACE TO FACE EVALUATION WAS PERFORMED  Clide Deutscher Samone Guhl 05/17/2020, 8:53 AM

## 2020-05-17 NOTE — Progress Notes (Signed)
Physical Therapy Session Note  Patient Details  Name: Stacy Moran MRN: [id number] Date of Birth: 05-Feb-1956  Today's Date: 05/17/2020 PT Individual Time: 1125-1219 PT Individual Time Calculation (min): 54 min   Short Term Goals: Week 3:  PT Short Term Goal 1 (Week 3): Pt will complete bed mobility with supervision PT Short Term Goal 2 (Week 3): Pt will consistently perform bed<>chair transfers with CGA and LRAD PT Short Term Goal 3 (Week 3): Pt will ambulate 166ft with minA of 1 person and LRAD PT Short Term Goal 4 (Week 3): Pt will initiate stair training PT Short Term Goal 5 (Week 3): Pt will consistently demonstrate improved safety awareness and decreased impulsivity with functional mobility tasks  Skilled Therapeutic Interventions/Progress Updates:    Pt received sitting in w/c with her daughter present for family education/training. Pt appears more calm today (though remains very distractible and fidgety throughout session). Pt is agreeable to therapy session. Confirmed home set-up of 2STE BHRs, 2 story home with pt able to live on main level with bedroom and 1/2 bath.  Transported to/from gym in w/c for time management and energy conservation. Educated daughter on proper use of gait belt. Therapist educated pt's daughter on performing stand pivot transfers w/<>EOM, not using AD, and encouraging pt to push up from seat and use w/c armrests for support - recommended performing transfers in this manner (without AD) for more automatic motor planning. Pt performed 2x stand pivot w/c<>EOM with daughter assisting and therapist providing supervision/cuing throughout as needed. Pt's daughter demonstrates excellent hands on assistance and great ability to adjust assistance as needed depending on how pt performs the mobility task. Discussed recommendation of using w/c in the home when pt having increased lethargy and decreased participation and then assisting pt to ambulate when safe and able. Therapist  provided pt with RW to trial during ambulation as pt's daughter reports not feeling comfortable assisting pt without it. Gait training ~53ft 1x with therapist and then 3x with pt's daughter providing min assistance - upon initiation of 1st walk pt pushed AD to side and started to reach for a box of bean bags, therapist able to redirect pt and use B UE support on RW as a way to maintain pt's attention to the task and assist with guiding/directing pt towards mat - demonstrated how to use gait belt and proper hand placement to safely assist pt with pt's daughter demonstrating understanding. Discussed that in home environment pt may be more prone to place AD off to the side and may or may not be able to redirect her to use RW safely resulting in pt's daughter having to transition to HHA instead. Pt's daughter reports feeling comfortable with this and demonstrates excellent adjustments to safely assist and redirect pt during mobility tasks throughout session - especially when pt going to sit in w/c as pt has more difficulty motor planning this task and starts sitting prematurely or becomes distracted with something in environment when turning to sit. Educated pt's daughter on stair navigation options with pt's daughter reporting not feeling comfortable bumping pt up the stairs in w/c. Discussed safe ambulatory step-to pattern for stairs; however, pt likely not able to follow commands to perform this way therefore educated on proper positioning to assist pt.  Pt ascended/descended 4 steps with therapist 1x and pt's daughter 1x, mod assist for balance both times - pt self selected reciprocal pattern on ascent using B HR support and therapist directed pt in step-to pattern sideways using BUE support  on L HR leading with LLE on descent for increased safety due to slight L knee giving way - when daughter assisting pt, she requires significant redirection when descending stairs as pt attempts to place her arm over daughter's  shoulders and then becomes distracted by the black bar holding the HR in place - educated daughter to have +2 assist for stairs at home due to this and discussed safe set-up of having w/c and chair at top/botom of stairs to allow pt a seated rest break while daughter moved DME. Pt's daughter reports no questions/concerns at this time and reports feeling comfortable assisting pt with mobility tasks as learned today. Pt transported back to room in w/c - left with needs in reach, lines intact, seat belt alarm on, daughter present, and NT present.   Therapy Documentation Precautions:  Precautions Precautions: Fall Precaution Comments: trach 28%/5L; pelvic restraint in bed, PMSV with staff, very low voice quality Required Braces or Orthoses: Other Brace Other Brace: abdominal binder - for PEG Restrictions Weight Bearing Restrictions: No  Pain:   Does not demonstrate any pain - daughter pointed out some bleeding around trach - RT present during session and placed gauze for skin protection and educated pt's daughter on this.  Therapy/Group: Individual Therapy  Tawana Scale , PT, DPT, CSRS  05/17/2020, 8:00 AM

## 2020-05-17 NOTE — Progress Notes (Signed)
Speech Language Pathology Daily Session Note  Patient Details  Name: Stacy Moran MRN: [id number] Date of Birth: 1955/05/18  Today's Date: 05/17/2020 SLP Individual Time: 2993-7169 SLP Individual Time Calculation (min): 30 min  Short Term Goals: Week 4: SLP Short Term Goal 1 (Week 4): Patient will attend to basic level functional task for increments of 60-90 seconds with modA cues to redirect SLP Short Term Goal 2 (Week 4): Patient will perform basic level functional tasks (brush teeth, wash hands, etc) with modA for attention and initiation. SLP Short Term Goal 3 (Week 4): Patient will write names of basic level objects, naming at least 5 different objects/object pictures in a session with modA SLP Short Term Goal 4 (Week 4): Patient will tolerate trials of ice chips with minimal overt s/s aspiration for determination of readiness for repeat MBS  Skilled Therapeutic Interventions: Skilled treatment session focused on communication goals and family education with the patient's daughter. Upon arrival, patient was sitting upright in the wheelchair and appeared calm and cooperative. SLP donned PMSV. It was initially expelled from trach hub due to cough and secretions. However, no further complications noted. PMSV remained in place for ~20 minutes with all vitals remaining WFL. Despite Max encouragement, patient unable to vocalize on command, with the exception of 1 time when she vocalized the word "great." Patient's daughter educated on appropriate parameters for donning/doffing PMSV, how to appropriately clean PMSV, the importance of oral care 4 X/day and use of suction toothbrush. She verbalized and demonstrated understanding. Patient left upright in wheelchair with alarm on and daughter present. Continue with current plan of care.      Pain Pain Assessment Pain Score: 0-No pain  Therapy/Group: Individual Therapy  Stacy Moran 05/17/2020, 2:22 PM

## 2020-05-17 NOTE — Progress Notes (Signed)
Occupational Therapy Session Note  Patient Details  Name: Stacy Moran MRN: [id number] Date of Birth: 07-Oct-1955  Today's Date: 05/17/2020 OT Individual Time: 1000-1045 OT Individual Time Calculation (min): 45 min    Short Term Goals: Week 4:  OT Short Term Goal 1 (Week 4): STGs=LTGs due to ELOS  Skilled Therapeutic Interventions/Progress Updates:    Pt received in bed sitting up and appearing restless trying to remove restraints. Removed restraints to have pt sit to EOB with guiding A.  Her daughter arrived for family education. Pt participated much more today and was much easier to guide as she was less distracted.  Her daughter did an excellent job at cuing her and has a strong intuitive sense of how to guide her mother through movement and self care.   Pt completed bed to [facility] (no void), then to wc to sit at sink to bathe and dress, completed several sit to stands. With ALL tasks, pt only needed min A but did need MOD verbal cues and tactile cues to initiate, focus, sequence on tasks and to avoid pulling on g tube.   Her daughter provided hands on care and practiced transferring her with stand pivots.  Pt can access downstairs half bath for the toilet but the tub has glass doors and is upstairs. At this time, she will only do sponge baths.  Discussed routine (ie toileting every 2 hours, keeping environment quiet and non distracting, trying to get her on a sleep schedule). Also discussed how to improve apraxia by having pt engage in task as much as possible but only A as needed and guide pt with cues.    Daughter does seem to have a good idea what she needs to do and has planned 12 weeks of FMLA.  Pt's speech therapist arrived for her next session.   Therapy Documentation Precautions:  Precautions Precautions: Fall Precaution Comments: trach 28%/5L; pelvic restraint in bed, PMSV with staff, very low voice quality Required Braces or Orthoses: Other Brace Other Brace: abdominal binder  - for PEG Restrictions Weight Bearing Restrictions: No    Vital Signs: Therapy Vitals Pulse Rate: 96 Resp: 20 Patient Position (if appropriate): Lying Oxygen Therapy SpO2: 96 % O2 Device: Room Air O2 Flow Rate (L/min): 5 L/min FiO2 (%): 21 % Pain: Pain Assessment Pain Score: 0-No pain ADL: ADL Eating: NPO Grooming: Minimal assistance Where Assessed-Grooming: Edge of bed Upper Body Bathing: Supervision/safety Where Assessed-Upper Body Bathing: Shower Lower Body Bathing: Contact guard Where Assessed-Lower Body Bathing: Shower Upper Body Dressing: Setup Where Assessed-Upper Body Dressing: Wheelchair Lower Body Dressing: Moderate cueing Where Assessed-Lower Body Dressing: Wheelchair Toileting:  (refused) Where Assessed-Toileting: Glass blower/designer:  (refused) Armed forces technical officer Method: Counselling psychologist: Event organiser: Environmental education officer Method: Radiographer, therapeutic: Grab bars,Transfer tub bench   Therapy/Group: Individual Therapy  [place] 05/17/2020, 12:54 PM

## 2020-05-18 LAB — GLUCOSE, CAPILLARY
Glucose-Capillary: 101 mg/dL — ABNORMAL HIGH (ref 70–99)
Glucose-Capillary: 111 mg/dL — ABNORMAL HIGH (ref 70–99)
Glucose-Capillary: 143 mg/dL — ABNORMAL HIGH (ref 70–99)
Glucose-Capillary: 154 mg/dL — ABNORMAL HIGH (ref 70–99)
Glucose-Capillary: 156 mg/dL — ABNORMAL HIGH (ref 70–99)
Glucose-Capillary: 74 mg/dL (ref 70–99)

## 2020-05-18 MED ORDER — QUETIAPINE FUMARATE 25 MG PO TABS
25.0000 mg | ORAL_TABLET | Freq: Every day | ORAL | Status: DC | PRN
  Administered 2020-05-18 – 2020-05-19 (×3): 25 mg via ORAL
  Filled 2020-05-18 (×4): qty 1

## 2020-05-18 NOTE — Progress Notes (Signed)
[place] PHYSICAL MEDICINE & REHABILITATION PROGRESS NOTE  Subjective/Complaints: Overnight patient was agitated- added additional 25mg  Seroquel prn after environmental changes failed to calm her.  Vital signs stable.   ROS: Unreliable due to cognition.  Objective: Vital Signs: Blood pressure 137/85, pulse 81, temperature 98.4 F (36.9 C), resp. rate 16, height 5\' 9"  (1.753 m), weight 59.3 kg, SpO2 93 %. No results found. No results for input(s): WBC, HGB, HCT, PLT in the last 72 hours. No results for input(s): NA, K, CL, CO2, GLUCOSE, BUN, CREATININE, CALCIUM in the last 72 hours. No intake or output data in the 24 hours ending 05/18/20 9622      Physical Exam: BP 137/85 (BP Location: Left Arm)   Pulse 81   Temp 98.4 F (36.9 C)   Resp 16   Ht 5\' 9"  (1.753 m)   Wt 59.3 kg   SpO2 93%   BMI 19.31 kg/m   Gen: no distress,  mildly distressed due to coughing and secretions.  HEENT: oral mucosa pink and moist, Right eye EOMI. #6 trach with trach collar with copious secretions Cardio: Reg rate Chest: normal effort, normal rate of breathing Abd: soft, + PEG. Ext: no edema Psych: Limited due to cognition Skin: intact Neuro: Alert Motor: 4+-5/5 throughout, appears stable  Assessment/Plan: 1. Functional deficits which require 3+ hours per day of interdisciplinary therapy in a comprehensive inpatient rehab setting.  Physiatrist is providing close team supervision and 24 hour management of active medical problems listed below.  Physiatrist and rehab team continue to assess barriers to discharge/monitor patient progress toward functional and medical goals   Care Tool:  Bathing    Body parts bathed by patient: Right arm,Left arm,Chest,Abdomen,Right upper leg,Left upper leg,Right lower leg,Left lower leg,Face,Front perineal area,Buttocks   Body parts bathed by helper: Right arm,Left arm,Chest,Abdomen     Bathing assist Assist Level: Minimal Assistance - Patient > 75%      Upper Body Dressing/Undressing Upper body dressing   What is the patient wearing?: Pull over shirt    Upper body assist Assist Level: Minimal Assistance - Patient > 75%    Lower Body Dressing/Undressing Lower body dressing      What is the patient wearing?: Incontinence brief,Pants     Lower body assist Assist for lower body dressing: Moderate Assistance - Patient 50 - 74%     Toileting Toileting Toileting Activity did not occur Landscape architect and hygiene only): Refused  Toileting assist Assist for toileting: Minimal Assistance - Patient > 75%     Transfers Chair/bed transfer  Transfers assist     Chair/bed transfer assist level: Minimal Assistance - Patient > 75%     Locomotion Ambulation   Ambulation assist      Assist level: Minimal Assistance - Patient > 75% Assistive device: Walker-rolling Max distance: 30ft   Walk 10 feet activity   Assist  Walk 10 feet activity did not occur: Refused  Assist level: Minimal Assistance - Patient > 75% Assistive device: Walker-rolling   Walk 50 feet activity   Assist Walk 50 feet with 2 turns activity did not occur: Refused  Assist level: 2 helpers Assistive device: Hand held assist    Walk 150 feet activity   Assist Walk 150 feet activity did not occur: Refused  Assist level: 2 helpers Assistive device: Hand held assist    Walk 10 feet on uneven surface  activity   Assist Walk 10 feet on uneven surfaces activity did not occur: Safety/medical concerns  Wheelchair     Assist Will patient use wheelchair at discharge?: Yes Type of Wheelchair: Manual Wheelchair activity did not occur: Refused         Wheelchair 50 feet with 2 turns activity    Assist            Wheelchair 150 feet activity     Assist          Medical Problem List and Plan: 1. 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  Continue CIR 2.  Antithrombotics: -DVT/anticoagulation: Continue Subcutaneous heparin             -antiplatelet therapy: N/A 3. Pain Management: Tylenol as needed 4. Mood: Provide emotional support             -antipsychotic agents: Seroquel 50 mg twice daily; additional 25mg  Seroquel PRN added for agitation.    Ativan changed to Klonopin - continue to evaluate lethargy   Renewed restraints for patient safety as well as 1:1 for observation             -pt is fall risk 5. Neuropsych: This patient is not capable of making decisions on her own behalf.  Ritalin twice daily started on 2/2 with improvement, increased on 2/9, continue to monitor-decreased due to restlessness after discussion with therapies  [name] for safety 6. Skin/Wound Care: Routine skin checks 7. Fluids/Electrolytes/Nutrition: Routine in and outs  BMP within acceptable range on 2/13 8. Seizure prophylaxis.   Keppra decreased to 500 BID on 1/28, decreased further on 2/2, DC'd on 2/7 9. 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 Constance Holster.   Continues on #6 cuffless Shiley currently, per ENT, holding on downsize d/t secretions.   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 Earleen Newport.       Dietary and speech therapy follow-up  Changed tube feeds to bolus feeds-discussed with dietary.  Advanced diet as tolerated 11. Hyperglycemia related to tube feeds. SSI   CBG (last 3)  Recent Labs    05/17/20 1956 05/18/20 0023 05/18/20 0403  GLUCAP 136* 156* 74   Elevated 2/20- continue to  monitor.  12. Hypothyroidism: Syndthroid 13. Hypertension. Hydralazine 50 mg every 8 hours, Normodyne 300 mg 3 times daily, Norvasc 10 mg daily   Vitals:   05/18/20 0005 05/18/20 0406  BP:  137/85  Pulse: 87 81  Resp: 16 16  Temp:  98.4 F (36.9 C)  SpO2: 92% 93%   Controlled on 2/19- continue regimen.   Monitor with increased mobility 13.  Acute blood loss anemia: Resolved  Hemoglobin 12.4 on 2/13  Continue to monitor 14. Slow transit constipation:   Improving 15.  Thrombocytopenia  Platelets 147 on 2/17, labs ordered for Monday   Continue to monitor  LOS: 26 days A FACE TO [place] 05/18/2020, 6:52 AM

## 2020-05-18 NOTE — Plan of Care (Signed)
  Problem: Safety: Goal: Non-violent Restraint(s) Outcome: Not Progressing; wrist restraints   Problem: RH SAFETY Goal: RH STG ADHERE TO SAFETY PRECAUTIONS W/ASSISTANCE/DEVICE Description: STG Adhere to Safety Precautions With  cues/reminders Assistance/Device. Outcome: Not Progressing; telesitter

## 2020-05-19 LAB — CBC WITH DIFFERENTIAL/PLATELET
Abs Immature Granulocytes: 0.04 10*3/uL (ref 0.00–0.07)
Basophils Absolute: 0 10*3/uL (ref 0.0–0.1)
Basophils Relative: 0 %
Eosinophils Absolute: 0.1 10*3/uL (ref 0.0–0.5)
Eosinophils Relative: 2 %
HCT: 36.7 % (ref 36.0–46.0)
Hemoglobin: 11.1 g/dL — ABNORMAL LOW (ref 12.0–15.0)
Immature Granulocytes: 1 %
Lymphocytes Relative: 39 %
Lymphs Abs: 2.2 10*3/uL (ref 0.7–4.0)
MCH: 26.6 pg (ref 26.0–34.0)
MCHC: 30.2 g/dL (ref 30.0–36.0)
MCV: 87.8 fL (ref 80.0–100.0)
Monocytes Absolute: 0.5 10*3/uL (ref 0.1–1.0)
Monocytes Relative: 9 %
Neutro Abs: 2.9 10*3/uL (ref 1.7–7.7)
Neutrophils Relative %: 49 %
Platelets: 136 10*3/uL — ABNORMAL LOW (ref 150–400)
RBC: 4.18 MIL/uL (ref 3.87–5.11)
RDW: 14.8 % (ref 11.5–15.5)
WBC: 5.7 10*3/uL (ref 4.0–10.5)
nRBC: 0.3 % — ABNORMAL HIGH (ref 0.0–0.2)

## 2020-05-19 LAB — GLUCOSE, CAPILLARY
Glucose-Capillary: 104 mg/dL — ABNORMAL HIGH (ref 70–99)
Glucose-Capillary: 117 mg/dL — ABNORMAL HIGH (ref 70–99)
Glucose-Capillary: 157 mg/dL — ABNORMAL HIGH (ref 70–99)
Glucose-Capillary: 211 mg/dL — ABNORMAL HIGH (ref 70–99)
Glucose-Capillary: 71 mg/dL (ref 70–99)
Glucose-Capillary: 78 mg/dL (ref 70–99)

## 2020-05-19 NOTE — Progress Notes (Signed)
Occupational Therapy Session Note  Patient Details  Name: Stacy Moran MRN: [id number] Date of Birth: 21-Apr-1955  Today's Date: 05/19/2020 OT Individual Time: 1100-1200 OT Individual Time Calculation (min): 60 min    Short Term Goals: Week 4:  OT Short Term Goal 1 (Week 4): STGs=LTGs due to ELOS  Skilled Therapeutic Interventions/Progress Updates:    Pt received in bed and woke easily. Pt was dressed so focused on mobility skills. Decided to trial a rollater to see if pt would respond better to turning it and walking closer to it vs pushing it forward as she does with the RW.  +2 A for safety. Pt stood with CGA to rollater and ambulated about 30 feet but needed mod A for balance and 2nd helper had to guide the rollater as pt could not follow how to turn it. She also pushed it too far in front of her. And despite handles on it, continued to let go to reach for items.  Returned to sit EOB.  trialled RW a 2nd time, same motor patterns of apraxia/ataxia with controlling RW, pushing it too far forward.  Encouraged pt to try to sit on toilet. She shook her head no that she did not need to go but was willing to try.  Got to toilet with min A stand pivot and then mod A to get pants down as she was not attending to task. Unable to void.  Pulled pants up with mod A. Returned to EOB,   Worked on a UE AROM game with pt holding a dowel bar and bouncing a ball back to the tech who was tossing it to her.  Max A to move dowel to hit ball but pt seemed to be enjoying it, worked on reaching arms over head.   Moved to supine to work on ball squeezes between knees, then lifting hips and rolling knees side to side. Minimal leg activation due to decreased attention and motor planning. Tried having her rolling her feet out and in on the ball. Pt smiling but not actively using her legs.   Pt resting in bed with alarm on, posey belt alarm on, B wrist restraints and call light in reach.   Therapy  Documentation Precautions:  Precautions Precautions: Fall Precaution Comments: trach 28%/5L; pelvic restraint in bed, PMSV with staff, very low voice quality Required Braces or Orthoses: Other Brace Other Brace: abdominal binder - for PEG Restrictions Weight Bearing Restrictions: No    Vital Signs: Therapy Vitals Pulse Rate: 80 Resp: 18 BP: 120/72 Patient Position (if appropriate): Lying Oxygen Therapy SpO2: 93 % O2 Device: Tracheostomy Collar O2 Flow Rate (L/min): 5 L/min FiO2 (%): 21 % Pain: Pain Assessment Pain Score: 0-No pain ADL: ADL Eating: NPO Grooming: Minimal assistance Where Assessed-Grooming: Edge of bed Upper Body Bathing: Supervision/safety Where Assessed-Upper Body Bathing: Shower Lower Body Bathing: Contact guard Where Assessed-Lower Body Bathing: Shower Upper Body Dressing: Setup Where Assessed-Upper Body Dressing: Wheelchair Lower Body Dressing: Moderate cueing Where Assessed-Lower Body Dressing: Wheelchair Toileting:  (refused) Where Assessed-Toileting: Glass blower/designer:  (refused) Armed forces technical officer Method: Counselling psychologist: Event organiser: Environmental education officer Method: Radiographer, therapeutic: Grab bars,Transfer tub bench   Therapy/Group: Individual Therapy  Cornesha Radziewicz 05/19/2020, 12:18 PM

## 2020-05-19 NOTE — Progress Notes (Signed)
Occupational Therapy Session Note  Patient Details  Name: Stacy Moran MRN: [id number] Date of Birth: 10-May-1955  Today's Date: 05/19/2020 OT Individual Time: 1305-1405 OT Individual Time Calculation (min): 60 min    Short Term Goals: Week 4:  OT Short Term Goal 1 (Week 4): STGs=LTGs due to ELOS  Skilled Therapeutic Interventions/Progress Updates:    Pt semi upright in bed, smiling, agreeable to taking a shower during OT session.  No signs of pain or discomfort throughout session.  Pt completed supine to sit with supervision.  CGA with hand held assist to guide pt and reduce distractibility EOB to shower bench in bathroom.  Pt required TCs to facilitate stand to sit at tub bench.  Pt doffed shirt and socks with supervision.  Required min assist to doff pants and brief at sit<>stand level.  Waterproof barrier applied to abdomen and waterproof cuff donned over trach collar.  Pt bathed UB and LB with CGA with pt exhibiting improved sequencing needing less VCs.  After drying off, pt donned shirt overhead and grip socks with supervision.  Brief and pants donned with min assist and max VCs to stay on task and problem solving.  Dependent assist to remove waterproof barriers and abdominal binder donned.  External suction after productive cough performed by OT x 2 during session.  Pts SPO2 96-100% at beginning and end of session on 28% humidified air/5L O2 via trach collar at beginning and end of session (removed for shower then redonned) Pt ambulated to EOB with CGA and hand held assist and pt initiated sit to supine and closed eyes, nodding when asked if she is tired.  Per nursing, pt has been attempting to pull at trach, therefore wrist restraints donned, posey bed alarm belt donned, bed alarm on, telesitter on at end of session.  Missed 15 minutes of treatment due to fatigue.    Therapy Documentation Precautions:  Precautions Precautions: Fall Precaution Comments: trach 28%/5L; pelvic restraint in  bed, PMSV with staff, very low voice quality Required Braces or Orthoses: Other Brace Other Brace: abdominal binder - for PEG Restrictions Weight Bearing Restrictions: No   Therapy/Group: Individual Therapy  Ezekiel Slocumb 05/19/2020, 3:27 PM

## 2020-05-19 NOTE — Progress Notes (Signed)
Physical Therapy Session Note  Patient Details  Name: Stacy Moran MRN: [id number] Date of Birth: [birth year]  Today's Date: 05/19/2020 PT Individual Time: 0900-0958 PT Individual Time Calculation (min): 58 min   Short Term Goals: Week 4:  PT Short Term Goal 1 (Week 4): STG = LTG due to ELOS  Skilled Therapeutic Interventions/Progress Updates:    Pt greeted supine in bed, awake and agreeable to therapy. Pt with posey pelvic restraint and bilateral wrist restraints on. Pt connected to 5L wall O2 at 28% fiO2 humidified. Spoke with RN who reports patient more calm and cooperative today, agreeable to leaving her OOB to the w/c with pelvic restraint on. Patient attempting to communicate with therapist but unable to vocalize or produce any sound and it's very difficult to read her lips. Provided her with a whiteboard and marker and she wrote "Sat did not come daughter." However, per chart review, daughter did come and family ed was performed with good results. Pt performed supine<>sit with supervision with HOB elevated. Noted thick copious secretions from trach site. Provided cough cues and used external suction for relief. Connected to 4L portable tank and ambulated to the bathroom with min/modA via bilateral hand held assist and +2 assist for line management. Pt required a heavy modA for safety approach to toilet and she attempts to sit prior to being fully over surface. Pt continent of bladder with timed toileting and able to complete pericare with setupA. Ambulated in similar manner as above back to her w/c with +2 assist.   W/c transport for time management to main hallway to focus remainder of session on functional gait training. She ambulated ~160ft with bilateral hand held assist with therapist facing patient. Purposes of B HHA to reduce distractions, improve standing balance, and reduce unwanted reaching. 2nd gait trial, ~135ft, used RW to improve indep with gait however she continues to require +2  assist and min/modA for balance due to poor safety awareness with RW. Gait speed also more decreased as compared to hand held assist.   Patient returned to her room at end of session in w/c. Ended session seated in w/c with pelvic restraint and safety belt alarm on, reconnected to wall humidified air 28% FiO2. Required external suctioning for copious secretions, cough cues needed.  Therapy Documentation Precautions:  Precautions Precautions: Fall Precaution Comments: trach 28%/5L; pelvic restraint in bed, PMSV with staff, very low voice quality Required Braces or Orthoses: Other Brace Other Brace: abdominal binder - for PEG Restrictions Weight Bearing Restrictions: No  Therapy/Group: Individual Therapy  Lakaya Tolen P Jhaden Pizzuto PT 05/19/2020, 7:32 AM

## 2020-05-19 NOTE — Progress Notes (Signed)
[place] PHYSICAL MEDICINE & REHABILITATION PROGRESS NOTE  Subjective/Complaints:  Pt says breathing OK, and denied needing suctioning, however trach O2 was clogged with phlegm she had excreted via trach and her O2 sats dropped from 93% to ~85% while I was in room. Asked nursing to suction her immediately. They did.   ROS: limited by cognition  Objective: Vital Signs: Blood pressure 131/81, pulse 82, temperature 97.8 F (36.6 C), temperature source Oral, resp. rate 18, height 5\' 9"  (1.753 m), weight 59.3 kg, SpO2 91 %. No results found. Recent Labs    05/19/20 0557  WBC 5.7  HGB 11.1*  HCT 36.7  PLT 136*   No results for input(s): NA, K, CL, CO2, GLUCOSE, BUN, CREATININE, CALCIUM in the last 72 hours. No intake or output data in the 24 hours ending 05/19/20 1958      Physical Exam: BP 131/81 (BP Location: Right Arm)   Pulse 82   Temp 97.8 F (36.6 C) (Oral)   Resp 18   Ht 5\' 9"  (1.753 m)   Wt 59.3 kg   SpO2 91%   BMI 19.31 kg/m   Gen: no distress, not coughing, laying in bed; calm, but sounds raspy, NAD; wearing wrist and abdominal waist restraints.  HEENT: oral mucosa pink and moist, Right eye EOMI. #6 trach with TC- O2 via TC- copious secretions- clogging trach slightly and all over O2-  Cardio: RRR- no JVD Chest: very coarse throughout with rhonchi- due to secretions- adequate air movement-  Abd: soft, NT, ND, (+)BS + PEG. Ext: no edema Psych: Limited due to cognition- poor awareness of surroundings.  Skin: intact Neuro: Alert Motor: 4+-5/5 throughout, appears stable  Assessment/Plan: 1. Functional deficits which require 3+ hours per day of interdisciplinary therapy in a comprehensive inpatient rehab setting.  Physiatrist is providing close team supervision and 24 hour management of active medical problems listed below.  Physiatrist and rehab team continue to assess barriers to discharge/monitor patient progress toward functional and medical goals   Care  Tool:  Bathing    Body parts bathed by patient: Right arm,Left arm,Chest,Abdomen,Right upper leg,Left upper leg,Right lower leg,Left lower leg,Face,Front perineal area,Buttocks   Body parts bathed by helper: Right arm,Left arm,Chest,Abdomen     Bathing assist Assist Level: Contact Guard/Touching assist     Upper Body Dressing/Undressing Upper body dressing   What is the patient wearing?: Pull over shirt    Upper body assist Assist Level: Supervision/Verbal cueing    Lower Body Dressing/Undressing Lower body dressing      What is the patient wearing?: Incontinence brief,Pants     Lower body assist Assist for lower body dressing: Minimal Assistance - Patient > 75%     Toileting Toileting Toileting Activity did not occur Landscape architect and hygiene only): Refused  Toileting assist Assist for toileting: Minimal Assistance - Patient > 75%     Transfers Chair/bed transfer  Transfers assist     Chair/bed transfer assist level: Minimal Assistance - Patient > 75%     Locomotion Ambulation   Ambulation assist      Assist level: Minimal Assistance - Patient > 75% Assistive device: Walker-rolling Max distance: 74ft   Walk 10 feet activity   Assist  Walk 10 feet activity did not occur: Refused  Assist level: Minimal Assistance - Patient > 75% Assistive device: Walker-rolling   Walk 50 feet activity   Assist Walk 50 feet with 2 turns activity did not occur: Refused  Assist level: 2 helpers Assistive device: Hand  held assist    Walk 150 feet activity   Assist Walk 150 feet activity did not occur: Refused  Assist level: 2 helpers Assistive device: Hand held assist    Walk 10 feet on uneven surface  activity   Assist Walk 10 feet on uneven surfaces activity did not occur: Safety/medical concerns         Wheelchair     Assist Will patient use wheelchair at discharge?: Yes Type of Wheelchair: Manual Wheelchair activity did not  occur: Refused         Wheelchair 50 feet with 2 turns activity    Assist            Wheelchair 150 feet activity     Assist          Medical Problem List and Plan: 1. 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  Continue CIR 2.  Antithrombotics: -DVT/anticoagulation: Continue Subcutaneous heparin             -antiplatelet therapy: N/A 3. Pain Management: Tylenol as needed 4. Mood: Provide emotional support             -antipsychotic agents: Seroquel 50 mg twice daily; additional 25mg  Seroquel PRN added for agitation.    Ativan changed to Klonopin - continue to evaluate lethargy   Renewed restraints for patient safety as well as 1:1 for observation  2/21- con't restraints since pt trying to pull at tubes/ and trach which is life threatening, if pulled.              -pt is fall risk 5. Neuropsych: This patient is not capable of making decisions on her own behalf.  Ritalin twice daily started on 2/2 with improvement, increased on 2/9, continue to monitor-decreased due to restlessness after discussion with therapies  [name] for safety 6. Skin/Wound Care: Routine skin checks 7. Fluids/Electrolytes/Nutrition: Routine in and outs  BMP within acceptable range on 2/13  2/21- will recheck CMP in AM 8. Seizure prophylaxis.   Keppra decreased to 500 BID on 1/28, decreased further on 2/2, DC'd on 2/7 9. 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 Constance Holster.   Continues on #6 cuffless Shiley currently, per ENT, holding on downsize d/t secretions.   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 Earleen Newport.       Dietary and speech therapy follow-up  Changed tube feeds to bolus feeds-discussed with dietary.  Advanced diet as tolerated 11. Hyperglycemia related to tube feeds. SSI   CBG (last 3)  Recent Labs    05/19/20 0759 05/19/20 1212 05/19/20 1610  GLUCAP 78 117* 104*   2/21- BGs great control 78-117- con't regimen.  12. Hypothyroidism: Syndthroid 13. Hypertension. Hydralazine 50 mg every 8 hours, Normodyne 300 mg 3 times daily, Norvasc 10 mg daily   Vitals:   05/19/20 1919 05/19/20 1932  BP:  131/81  Pulse: 77 82  Resp: 16 18  Temp:  97.8 F (36.6 C)  SpO2: 97% 91%   Controlled on 2/19- continue regimen.   Monitor with increased mobility 13.  Acute blood loss anemia: Resolved  Hemoglobin 12.4 on 2/13  Continue to  monitor 14. Slow transit constipation:   Improving 15.  Thrombocytopenia  Platelets 147 on 2/17, labs ordered for Monday  2/21- Plts 136- slightly less, will monitor   Continue to monitor  LOS: 27 days A FACE TO FACE EVALUATION WAS PERFORMED  Stacy Moran 05/19/2020, 7:58 PM

## 2020-05-20 ENCOUNTER — Other Ambulatory Visit: Payer: Self-pay | Admitting: Physician Assistant

## 2020-05-20 LAB — COMPREHENSIVE METABOLIC PANEL
ALT: 15 U/L (ref 0–44)
AST: 19 U/L (ref 15–41)
Albumin: 3.7 g/dL (ref 3.5–5.0)
Alkaline Phosphatase: 61 U/L (ref 38–126)
Anion gap: 9 (ref 5–15)
BUN: 26 mg/dL — ABNORMAL HIGH (ref 8–23)
CO2: 31 mmol/L (ref 22–32)
Calcium: 10.1 mg/dL (ref 8.9–10.3)
Chloride: 99 mmol/L (ref 98–111)
Creatinine, Ser: 0.68 mg/dL (ref 0.44–1.00)
GFR, Estimated: >60 mL/min (ref >60)
Glucose, Bld: 149 mg/dL — ABNORMAL HIGH (ref 70–99)
Potassium: 4.7 mmol/L (ref 3.5–5.1)
Sodium: 139 mmol/L (ref 135–145)
Total Bilirubin: 0.4 mg/dL (ref 0.3–1.2)
Total Protein: 7.5 g/dL (ref 6.5–8.1)

## 2020-05-20 LAB — GLUCOSE, CAPILLARY
Glucose-Capillary: 127 mg/dL — ABNORMAL HIGH (ref 70–99)
Glucose-Capillary: 148 mg/dL — ABNORMAL HIGH (ref 70–99)
Glucose-Capillary: 48 mg/dL — ABNORMAL LOW (ref 70–99)
Glucose-Capillary: 77 mg/dL (ref 70–99)
Glucose-Capillary: 89 mg/dL (ref 70–99)
Glucose-Capillary: 91 mg/dL (ref 70–99)

## 2020-05-20 MED ORDER — OSMOLITE 1.2 CAL PO LIQD: 355.0000 mL | Freq: Three times a day (TID) | ORAL | 0 refills | Status: DC

## 2020-05-20 MED ORDER — PROSOURCE TF PO LIQD: 45.0000 mL | Freq: Two times a day (BID) | ORAL | Status: DC

## 2020-05-20 MED ORDER — METHYLPHENIDATE HCL 5 MG PO TABS: 2.5000 mg | ORAL_TABLET | Freq: Two times a day (BID) | ORAL | 0 refills | Status: DC

## 2020-05-20 MED ORDER — LEVOTHYROXINE SODIUM 100 MCG PO TABS: 100.0000 ug | ORAL_TABLET | Freq: Every day | ORAL | 0 refills | Status: DC

## 2020-05-20 MED ORDER — AMLODIPINE BESYLATE 10 MG PO TABS: 10.0000 mg | ORAL_TABLET | Freq: Every day | ORAL | 0 refills | Status: DC

## 2020-05-20 MED ORDER — QUETIAPINE FUMARATE 50 MG PO TABS: 50.0000 mg | ORAL_TABLET | Freq: Two times a day (BID) | ORAL | 0 refills | Status: DC

## 2020-05-20 MED ORDER — ACETAMINOPHEN 325 MG PO TABS: 650.0000 mg | ORAL_TABLET | Freq: Four times a day (QID) | ORAL | Status: DC | PRN

## 2020-05-20 MED ORDER — LABETALOL HCL 300 MG PO TABS: 300.0000 mg | ORAL_TABLET | Freq: Three times a day (TID) | ORAL | 0 refills | Status: DC

## 2020-05-20 MED ORDER — INSULIN ASPART 100 UNIT/ML ~~LOC~~ SOLN
0.0000 [IU] | SUBCUTANEOUS | Status: DC
  Administered 2020-05-20: 2 [IU] via SUBCUTANEOUS
  Administered 2020-05-20: 3 [IU] via SUBCUTANEOUS
  Administered 2020-05-21: 2 [IU] via SUBCUTANEOUS

## 2020-05-20 MED ORDER — PANTOPRAZOLE SODIUM 40 MG PO PACK: 40.0000 mg | PACK | Freq: Every day | ORAL | 0 refills | Status: DC

## 2020-05-20 MED ORDER — SCOPOLAMINE 1 MG/3DAYS TD PT72: 1.0000 | MEDICATED_PATCH | TRANSDERMAL | 12 refills | Status: DC

## 2020-05-20 MED ORDER — CLONAZEPAM 0.25 MG PO TBDP: 0.2500 mg | ORAL_TABLET | Freq: Two times a day (BID) | ORAL | 0 refills | Status: DC

## 2020-05-20 MED ORDER — HYDRALAZINE HCL 50 MG PO TABS: 50.0000 mg | ORAL_TABLET | Freq: Three times a day (TID) | ORAL | 0 refills | Status: DC

## 2020-05-20 MED ORDER — GLYCOPYRROLATE 1 MG PO TABS: 2.0000 mg | ORAL_TABLET | Freq: Two times a day (BID) | ORAL | 0 refills | Status: DC

## 2020-05-20 MED ORDER — FREE WATER: 200.0000 mL | Freq: Four times a day (QID) | Status: DC

## 2020-05-20 MED FILL — CLONAZEPAM 0.25 MG TBDP: 0.25 | 30 days supply | Qty: 60 | Fill #0

## 2020-05-20 MED FILL — SCOPOLAMINE 1 MG/3DAYS PT72: 1 | 30 days supply | Qty: 10 | Fill #0

## 2020-05-20 MED FILL — hydrALAZINE HCL 50 MG TABS: 50 | 30 days supply | Qty: 90 | Fill #0

## 2020-05-20 MED FILL — PANTOPRAZOLE SODIUM 40 MG P: 40 | 30 days supply | Qty: 30 | Fill #0

## 2020-05-20 MED FILL — GLYCOPYRROLATE 1 MG TABLET: 1 | 30 days supply | Qty: 60 | Fill #0

## 2020-05-20 MED FILL — METHYLPHENIDATE 5 MG TABLET: 5 | 30 days supply | Qty: 60 | Fill #0

## 2020-05-20 MED FILL — QUETIAPINE FUMARATE 50 MG T: 50 | 30 days supply | Qty: 60 | Fill #0

## 2020-05-20 MED FILL — AMLODIPINE BESYLATE 10 MG T: 10 | 30 days supply | Qty: 30 | Fill #0

## 2020-05-20 MED FILL — LEVOTHYROXINE SODIUM 100 MC: 100 | 30 days supply | Qty: 30 | Fill #0

## 2020-05-20 MED FILL — LABETALOL HCL 300 MG TABLET: 300 | 30 days supply | Qty: 90 | Fill #0

## 2020-05-20 NOTE — Progress Notes (Addendum)
Patient ID: Stacy Moran, female   DOB: [birth year], 65 y.o.   MRN: [id number] Spoke with Kayla-trach team to arrange education with daughter tomorrow at 11:00. She will pass along to person who is assigned to the floor tomorrow. Daughter made aware of this.  1;30 PM Spoke with daughter via telephone to inform of trach teaching tomorrow and if has anything been delivered to her home for pt. She reports nothing has been delivered yet. Some has been delivered to the room-suction, 3 in 1 and walker-which worker did not order. Will need tube feedings and trach supplies along with humidified air for trach at home. Daughter bringing her husband-keith tomorrow to assist her. Have called front desk to allow him to come up with daughter and do education prior to discharge.

## 2020-05-20 NOTE — Progress Notes (Signed)
Speech Language Pathology Discharge Summary  Patient Details  Name: Stacy Moran MRN: [id number] Date of Birth: 1955/12/01  Today's Date: 05/20/2020 SLP Individual Time: 1332-1430 SLP Individual Time Calculation (min): 58 min   Skilled Therapeutic Interventions:  Skilled ST services focused on speech skills.  SLP spent the majority of the session managing traceal secretions. Upon entering room pt had humidify air placed to side of trach hub and vital sensor was off. SLP replaced humidified air over trach hub and constantly reapplied vital sensor throughout session, even application of mit was unsuccessful deterring at pt's attempt to remove sensor. SLP donned on PMSV in 2-5 minute interval, however was removed due to episodes of coughing in attempts to expel secretions primarily in 1-2 minute intervals throughout session. Pt was able to demonstrate a productive cough via trach hub x7 resulting in thick secretions. Pt's O2 stats remained in the mid 90's majority of the session even during expelling attempts, however when PMSV was in place it dropped to 88, SLP removed quickly and then without PMSV in place dropped to 73. Pt was eventually able to expel thick secretions. Nursing staff was notified and preformed deep suction with little success. repository team was requested. Pt was unable to phonate on command with PMSV in place and required cues for sustained attention in 3-45 second intervals. Pt was left in room with call bell within reach and chair alarm set. SLP recommends to continue skilled services.    Patient has met  (0) of 8 long term goals.  Patient to discharge at overall Mod;Max level.  Reasons goals not met:   severity of secretions and inconsistent participation/attention.   Clinical Impression/Discharge Summary:   Pt met 0 out 8 goals due to severity of medical conditions with poor management of secretions via trach and inconsistent participation/attention. Pt remains NPO and with  trach in place and tolerance of PMSV with ST (up to 20-25 minutes) as well as intermittently with trained staff for (5 minutes) however secretion are a constant concern. Pt's participation in cognitive, swallow and speech tasks were limited by secretion management as well as fluctuating participation/attention. Pt's primarily cognitive deficit is in attention at the 30-60 second level, therefore impacting problem solving, recall and safety awareness. Pt is attempting to remove safety items constantly during session. Pt demonstrated minimal vocal phonation at the word level, however not consistent. SLP continues to recommend NPO status with need for instrumental swallow assessment and control of secretions prior to diet initiation. Recommending trials of ice chips with ST only. Pt is able to respond to yes/no questions and write at the word level with 60% accuracy. SLP is concerned with pt safety and poor management of secretions at discharge. Pt benefited from skilled ST services in order to maximize functional independence and reduce burden of care, requiring 24 hour supervision at discharge with continued skilled ST services.  Care Partner:  Caregiver Able to Provide Assistance: Yes  Type of Caregiver Assistance: Cognitive;Physical  Recommendation:  Home Health SLP;24 hour supervision/assistance;Skilled Nursing facility;Outpatient SLP  Rationale for SLP Follow Up: Maximize functional communication;Maximize swallowing safety;Maximize cognitive function and independence;Reduce caregiver burden   Equipment: N/A   Reasons for discharge: Discharged from hospital   Patient/Family Agrees with Progress Made and Goals Achieved: Yes    Stacy Moran  [facility] 05/20/2020, 4:09 PM

## 2020-05-20 NOTE — Progress Notes (Signed)
Hypoglycemic Event  CBG: 48 at 0359  Treatment: Pt given half of 0800 scheduled tube feed.  Symptoms: No symptoms noted; VSS.  Follow-up CBG: Time:0420 CBG Result: 89  Possible Reasons for Event: Pt is on Resistant insulin scale.   Comments/MD notified: Charge RN made aware, MD/PA to be notified during am rounds.    Harrell Lark, MELISSA G

## 2020-05-20 NOTE — Discharge Summary (Addendum)
Physician Discharge Summary  Patient ID: Stacy Moran MRN: [id number] DOB/AGE: 1955/04/30 65 y.o.  Admit date: 04/22/2020 Discharge date: 05/21/2020  Discharge Diagnoses:  Principal Problem:   ICH (intracerebral hemorrhage) ([place]) Active Problems:   Benign essential HTN   Dysphagia   Status post tracheostomy ([place])   S/P percutaneous endoscopic gastrostomy (PEG) tube placement (H[facility])   Protein-calorie malnutrition, severe   Dysphagia, post-stroke   Essential hypertension   Seizure prophylaxis   Acute blood loss anemia   Restlessness   Restlessness and agitation   Thrombocytopenia ([facility]) Hypothyroidism  Discharged Condition: Stable  Significant Diagnostic Studies: DG Chest 2 View  Result Date: 05/11/2020 CLINICAL DATA:  Fever EXAM: CHEST - 2 VIEW COMPARISON:  03/01/2020 FINDINGS: Cardiomegaly. Tracheostomy. Both lungs are clear. The visualized skeletal structures are unremarkable. IMPRESSION: 1. Cardiomegaly without acute abnormality of the lungs. No airspace opacity. 2.  Tracheostomy. Electronically Signed   By: Eddie Candle M.D.   On: 05/11/2020 11:20    Labs:  Basic Metabolic Panel: Recent Labs  Lab 05/20/20 0508  NA 139  K 4.7  CL 99  CO2 31  GLUCOSE 149*  BUN 26*  CREATININE 0.68  CALCIUM 10.1    CBC: Recent Labs  Lab 05/15/20 0505 05/19/20 0557  WBC 6.9 5.7  NEUTROABS 4.6 2.9  HGB 12.1 11.1*  HCT 39.7 36.7  MCV 87.8 87.8  PLT 147* 136*    CBG: Recent Labs  Lab 05/20/20 0422 05/20/20 0840 05/20/20 1156 05/20/20 1530 05/21/20 0359  GLUCAP 89 91 148* 77 79    Brief HPI:   Stacy Moran is a 65 y.o. right-handed female with unremarkable past medical history on no prescription medications.  Presented 01/30/2020 with acute headache progressively worsened becoming obtunded requiring intubation in the ED for airway protection.  Patient noted to be hypotensive.  Admission chemistries unremarkable except potassium 3.4 glucose 178 alcohol negative.  CT of  the head demonstrated primarily posterior fossa SAH around the foramen magnum and perimedullary cistern with casting of the fourth ventricle and extension into the third lateral ventricles and associated obstructive HCP.  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  Patient underwent right frontal ventriculostomy 01/30/2020 per Dr. Kathyrn Sheriff she did undergo diagnostic cerebral angiogram for further delineating left PICA aneurysm.  It was noted not to be amenable to endovascular treatment underwent suboccipital craniotomy for clipping of PICA aneurysm 01/31/2020.  Ultrasound soft tissue head and neck showed probable right thyroid nodule 2.3 x 2.3 x 2.4 cm.  Hospital course further complicated by prolonged ventilatory support requiring tracheostomy as well as thyroidectomy 02/08/2020 per Dr. Constance Holster maintained on a #6 cuffless flexible Shiley tracheostomy tube.  Patient remained n.p.o. gastrostomy tube placed 02/22/2020 per interventional radiology Dr. Earleen Newport.  She was cleared to begin subcutaneous heparin for DVT prophylaxis.  She completed a 21-day course of Nimotop for blood pressure control.  Palliative care consulted to establish goals of care.  Patient was weaned from external ventricular drain and appeared to be neurologically stable but progressively became more somnolent with repeat scan imaging demonstrated ventriculomegaly consistent with delayed onset of hydrocephalus.  EEG negative for seizure.  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 Kathyrn Sheriff.  Therapy evaluations completed due to patient's decreased functional ability cognitive deficits was admitted for a comprehensive rehab program.   [facility]  Course: Stacy Moran was admitted to rehab 04/22/2020 for inpatient therapies to  consist of PT, ST and OT at least three hours five days a week. Past admission physiatrist, therapy team and rehab RN have worked together to provide customized collaborative inpatient rehab.  Pertaining to patient's left SAH status post right frontal ventriculostomy 01/30/2020 with right SDH followed by laparoscopic assisted insertion of ventriculoperitoneal shunt as well as left PICA aneurysm clipping with left occipital craniotomy bur hole evacuation of chronic subdural hematoma 03/18/2020.  Surgical site healing nicely she would follow-up neurosurgery.  She had been cleared for subcutaneous heparin for DVT prophylaxis during her hospital course.  Mood stabilization with the use of Klonopin twice daily as well as initiation of Ritalin to help patient maintain focus and attention to task.  Seroquel ongoing at 50 mg twice daily for agitation with good results.  She had been on Keppra for seizure prophylaxis slowly tapered off no seizure activity.  In regards to patient's tracheostomy thyroidectomy 02/08/2020 followed by ENT Dr. Constance Holster currently with a #6 cuffless Shiley with noted secretions reluctant to decannulate at this time conversations with ENT further discussed and current plan is to discharge to home with #6 Shiley humidified air in follow-up in a trach clinic.  Patient remains n.p.o. with gastrostomy tube feeds with PEG tube placed 02/22/2020 per interventional radiology Dr. Earleen Newport.  Her blood pressure remains controlled currently on Norvasc as well as hydralazine and would need follow-up outpatient.  Synthroid ongoing for her hypothyroidism after thyroid surgery.   Blood pressures were monitored on TID basis and soft and monitored     Rehab course: During patient's stay in rehab weekly team conferences were held to monitor patient's progress, set goals and discuss barriers to discharge. At admission, patient required moderate assist 15 feet rolling walker moderate assist sit stand minimal assist  sit to supine.  Max assist upper body dressing total assist lower body dressing +2 physical assist toilet transfers  Physical exam.  Blood pressure 133/82 pulse 84 temperature 98.3 respirations 17 oxygen saturations 96% room air Constitutional.  No acute distress HEENT.  Scalp incision healed Neck.  #6 cuffless Shiley trach in place Eyes.  Pupils round and reactive to light no discharge without nystagmus Cardiac regular rate rhythm without extra sounds or murmur heard Abdomen.  Soft nontender positive bowel sounds gastrostomy tube in place Respiratory effort normal no respiratory distress without wheeze Neurologic.  Patient is alert follows simple commands makes eye contact with examiner yes no head nods appropriate.  Upper extremities 4/5 proximal to distal lower extremities 3+ hip flexors to 4+/5 distally.  Senses pain in all 4  He/She  has had improvement in activity tolerance, balance, postural control as well as ability to compensate for deficits. He/She has had improvement in functional use RUE/LUE  and RLE/LLE as well as improvement in awareness.  Patient with progressive gains during rehabilitation course.  She continues humidified air during her therapies.  Ambulating 175 feet bilateral hand-held assist with therapies.  She was able to use a white board for some communication.  Perform supine to sit with supervision.  Patient stood with contact-guard to Rollator ambulates 30 feet to complete ADLs needing some assist for lower body ADLs.  Toileting minimal assist.  Speech therapy follow-up PMSV and she was able to communicate simple needs.  Full family teaching completed plan discharged home.       Disposition: Discharged home    Diet: NPO.  Osmolite 355 mL 3 times daily by  tube Free water 200 mL every 6 hours by tube  Medications at discharge 1.  Tylenol as needed 2.  Norvasc 10 mg daily by tube 3.  Klonopin 0.25 mg twice daily by tube 4.  Robinul 2 mg twice daily by tube 5.   Hydralazine 50 mg every 8 hours by tube 6.  Labetalol 300 mg 3 times daily by tube 7.  Synthroid 100 mcg daily by tube 8.  Ritalin 2.5 mg twice daily by tube 9.  Protonix 40 mg daily by tube 10.  Seroquel 50 mg twice daily by tube 11.  Scopolamine patch change as directed  30-35 minutes were spent completing discharge summary and discharge planning  Special Instructions: No driving smoking or alcohol   Patient should follow-up with ENT Dr. Constance Holster [phone] in regards to tracheostomy care and plan decannulation.  Continue humidified air  Discharge Instructions     Ambulatory referral to Physical Medicine Rehab   Complete by: As directed    Moderate complexity follow-up 1 to 2 weeks ICH        Follow-up Information     Jamse Arn, MD Follow up.   Specialty: Physical Medicine and Rehabilitation Why: office to call for appointment Contact information: [place] [address] [phone]         [name], MD Follow up.   Specialty: Neurosurgery Why: Call for appointment Contact information: [address] [place] [address] [phone]         [name], MD Follow up.   Specialty: Otolaryngology Why: Call for appointment Contact information: [address] [place] [address] [phone]         Corrie Mckusick, DO Follow up.   Specialties: Interventional Radiology, Radiology Why: Call for appointment Contact information: [address] [place] [address] [phone]         [name], MD Follow up on 06/05/2020.   Specialty: Internal Medicine Why: Appointment @ 9:30 Am Contact information: [place] [address] [phone]                 Signed: Cathlyn Parsons 05/21/2020, 5:16 AM Patient was seen, face-face, and physical exam performed by me on day of discharge, greater than 30 minutes of total time  spent.. Please see progress note from day of discharge as well.  Delice Lesch, MD, ABPMR

## 2020-05-20 NOTE — Progress Notes (Signed)
Physical Therapy Session Note  Patient Details  Name: Stacy Moran MRN: [id number] Date of Birth: 05-Apr-1955  Today's Date: 05/20/2020 PT Individual Time: 1015-1045 PT Individual Time Calculation (min): 30 min   Short Term Goals: Week 4:  PT Short Term Goal 1 (Week 4): STG = LTG due to ELOS  Skilled Therapeutic Interventions/Progress Updates:   Pt received sitting upright in w/c with pelvic restraint and safety belt alarm on, connected to 28% FiO2 wall via trach mask. Removed pelvic restraint and safety belt with totalA. Sit<>stand with minA and no AD.  Patient seated upright in w/c with pelvic restraint and belt alarm on upon PT arrival. Trach mask loosely fitted at 28% FiO2.  Patient alert and agreeable to PT session. Even able to mouth "how are you" on entry. No pain complaint throughout session. RN completing administration of meds via feeding tube while restraints, alarms, and lines managed prior to activity.   Therapeutic Activity: Transfers: Patient performed STS w/c to HHA from therapist x6. Blocked practice for technique and sequencing in use of hands with vc and hand-over-hand assist to push from seat to rise to stand. Is able to perform with CGA however wants to stand each time to the R of her w/c. Requires physical redirection for correct position prior to sit to chair.   Neuromuscular Re-ed: NMR facilitated during session with focus on standing static and dynamic balance. Pt guided in  marches requiring visual demonstration for improved performance, pivot stepping to each side and forward/ backward stepping. All completed with CGA and Bilateral HHA or L HHA and CGA to pt's R hip for balance and tactile cueing in directionality/ sequencing. NMR performed for improvements in motor control and coordination, balance, sequencing, to perform all aspects of mobility at highest level of independence.   Patient seated upright in w/c at end of session with brakes locked, belt alarm set,  pelvic restraint in place, and all needs within reach.   Therapy Documentation Precautions:  Precautions Precautions: Fall Precaution Comments: trach 28%/5L; impulsive, occasionally restless, very low and poor voice quality Required Braces or Orthoses: Other Brace Other Brace: abdominal binder - for PEG Restrictions Weight Bearing Restrictions: No Pain: Pain Assessment Pain Score: 0-No pain  Therapy/Group: Individual Therapy  Alger Simons 05/20/2020, 12:59 PM

## 2020-05-20 NOTE — Progress Notes (Signed)
[place] PHYSICAL MEDICINE & REHABILITATION PROGRESS NOTE  Subjective/Complaints: Patient seen laying in bed this AM.  She had hypoglycemia overnight per nursing.  Awaiting callback from ENT.  ROS: limited by cognition  Objective: Vital Signs: Blood pressure (!) 149/88, pulse 95, temperature 97.7 F (36.5 C), temperature source Oral, resp. rate 18, height 5\' 9"  (1.753 m), weight 59.3 kg, SpO2 98 %. No results found. Recent Labs    05/19/20 0557  WBC 5.7  HGB 11.1*  HCT 36.7  PLT 136*   Recent Labs    05/20/20 0508  NA 139  K 4.7  CL 99  CO2 31  GLUCOSE 149*  BUN 26*  CREATININE 0.68  CALCIUM 10.1   No intake or output data in the 24 hours ending 05/20/20 0813      Physical Exam: BP (!) 149/88 (BP Location: Left Arm)   Pulse 95   Temp 97.7 F (36.5 C) (Oral)   Resp 18   Ht 5\' 9"  (1.753 m)   Wt 59.3 kg   SpO2 98%   BMI 19.31 kg/m   Constitutional: No distress . Vital signs reviewed. HENT: Normocephalic.  Atraumatic. Eyes: Right eye EOMI. No discharge. Neck: + #6 trach with trach collar and secretions Cardiovascular: No JVD.  RRR. Respiratory: Normal effort.  No stridor.  Bilateral clear to auscultation.  Upper airway sounds. GI: Non-distended.  BS +.  + PEG. Skin: Warm and dry.  Intact. Psych: Limited due to cognition Musc: No edema in extremities.  No tenderness in extremities. Neuro: Somnolent Motor: 4+-5/5 throughout, appears unchanged  Assessment/Plan: 1. Functional deficits which require 3+ hours per day of interdisciplinary therapy in a comprehensive inpatient rehab setting.  Physiatrist is providing close team supervision and 24 hour management of active medical problems listed below.  Physiatrist and rehab team continue to assess barriers to discharge/monitor patient progress toward functional and medical goals   Care Tool:  Bathing    Body parts bathed by patient: Right arm,Left arm,Chest,Abdomen,Right upper leg,Left upper leg,Right  lower leg,Left lower leg,Face,Front perineal area,Buttocks   Body parts bathed by helper: Right arm,Left arm,Chest,Abdomen     Bathing assist Assist Level: Contact Guard/Touching assist     Upper Body Dressing/Undressing Upper body dressing   What is the patient wearing?: Pull over shirt    Upper body assist Assist Level: Supervision/Verbal cueing    Lower Body Dressing/Undressing Lower body dressing      What is the patient wearing?: Incontinence brief,Pants     Lower body assist Assist for lower body dressing: Minimal Assistance - Patient > 75%     Toileting Toileting Toileting Activity did not occur Landscape architect and hygiene only): Refused  Toileting assist Assist for toileting: Minimal Assistance - Patient > 75%     Transfers Chair/bed transfer  Transfers assist     Chair/bed transfer assist level: Minimal Assistance - Patient > 75%     Locomotion Ambulation   Ambulation assist      Assist level: Minimal Assistance - Patient > 75% Assistive device: Walker-rolling Max distance: 4ft   Walk 10 feet activity   Assist  Walk 10 feet activity did not occur: Refused  Assist level: Minimal Assistance - Patient > 75% Assistive device: Walker-rolling   Walk 50 feet activity   Assist Walk 50 feet with 2 turns activity did not occur: Refused  Assist level: 2 helpers Assistive device: Hand held assist    Walk 150 feet activity   Assist Walk 150 feet activity did  not occur: Refused  Assist level: 2 helpers Assistive device: Hand held assist    Walk 10 feet on uneven surface  activity   Assist Walk 10 feet on uneven surfaces activity did not occur: Safety/medical concerns         Wheelchair     Assist Will patient use wheelchair at discharge?: Yes Type of Wheelchair: Manual Wheelchair activity did not occur: Refused         Wheelchair 50 feet with 2 turns activity    Assist            Wheelchair 150 feet  activity     Assist          Medical Problem List and Plan: 1. 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  Continue CIR, family education 2.  Antithrombotics: -DVT/anticoagulation: Continue Subcutaneous heparin             -antiplatelet therapy: N/A 3. Pain Management: Tylenol as needed 4. Mood: Provide emotional support             -antipsychotic agents: Seroquel 50 mg twice daily; additional 25mg  Seroquel PRN added for agitation.    Ativan changed to Klonopin - continue to evaluate lethargy   Renewed restraints for patient safety as well as 1:1 for observation            -pt is fall risk 5. Neuropsych: This patient is not capable of making decisions on her own behalf.  Ritalin twice daily started on 2/2 with improvement, increased on 2/9, continue to monitor-decreased due to restlessness after discussion with therapies  Telesitter for safety 6. Skin/Wound Care: Routine skin checks 7. Fluids/Electrolytes/Nutrition: Routine in and outs  BMP within acceptable range on 2/22 8. Seizure prophylaxis.   Keppra decreased to 500 BID on 1/28, decreased further on 2/2, DC'd on 2/7 9. 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 Constance Holster.   Continues on #6 cuffless Shiley currently, per ENT, holding on downsize d/t secretions.   Glycopyrolate initiated, increased on 2/17  Discussed with ENT, await further recommendations  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 Earleen Newport.       Dietary and speech therapy follow-up  Changed tube feeds to bolus feeds-discussed with dietary.  Advanced diet as  tolerated 11. Hyperglycemia related to tube feeds. SSI   CBG (last 3)  Recent Labs    05/20/20 0009 05/20/20 0359 05/20/20 0422  GLUCAP 127* 48* 89   Has been relatively controlled during her stay, however hypoglycemia overnight, sensitivity scale reduced on 2/22 12. Hypothyroidism: Syndthroid 62. Hypertension. Hydralazine 50 mg every 8 hours, Normodyne 300 mg 3 times daily, Norvasc 10 mg daily   Vitals:   05/20/20 0410 05/20/20 0805  BP:    Pulse: 74 95  Resp: 16 18  Temp:    SpO2: 97% 98%   Controlled on 2/22  Monitor with increased mobility 13.  Acute blood loss anemia: Resolved  Hemoglobin 12.4 on 2/13  Continue to monitor 14. Slow transit constipation:   Improving 15.  Thrombocytopenia  Platelets 136 on 2/21  Continue to monitor  LOS: 28 days A FACE TO FACE EVALUATION WAS PERFORMED  Audi Conover Lorie Phenix 05/20/2020, 8:13 AM

## 2020-05-20 NOTE — Progress Notes (Signed)
Occupational Therapy Discharge Summary  Patient Details  Name: Stacy Moran MRN: [id number] Date of Birth: [birth year]     Patient has met 8 of 8 long term goals due to improved activity tolerance, improved balance, postural control, improved attention, improved awareness and improved coordination.  Patient to discharge at [facility] Assist level.  Patient's care partner is independent to provide the necessary physical and cognitive assistance at discharge.    Reasons goals not met: n/a  Recommendation:  Patient will benefit from ongoing skilled OT services in home health setting to continue to advance functional skills in the area of BADL, but unfortunately she does not have coverage.  Equipment: BSC. tub bench  Reasons for discharge: treatment goals met  Patient/family agrees with progress made and goals achieved: Yes  OT Discharge Precautions/Restrictions  Precautions Precautions: Fall Precaution Comments: trach 28%/5L; impulsive, occasionally restless, very low and poor voice quality Required Braces or Orthoses: Other Brace Other Brace: abdominal binder - for PEG Restrictions Weight Bearing Restrictions: No    ADL ADL Eating: NPO Grooming: Supervision/safety Where Assessed-Grooming: Sitting at sink Upper Body Bathing: Supervision/safety Where Assessed-Upper Body Bathing: Shower Lower Body Bathing: Contact guard Where Assessed-Lower Body Bathing: Shower Upper Body Dressing: Setup Where Assessed-Upper Body Dressing: Wheelchair Lower Body Dressing: Moderate cueing,Contact guard Where Assessed-Lower Body Dressing: Edge of bed Toileting: Minimal assistance Where Assessed-Toileting: Glass blower/designer: Scientist, clinical (histocompatibility and immunogenetics) Method: Arts development officer: Grab bars,Bedside Microbiologist: Curator Method: Radiographer, therapeutic: Statistician Baseline Vision/History:  (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) Patient Visual Report: No change from baseline Vision Assessment?: Vision impaired- to be further tested in functional context Tracking/Visual Pursuits: Unable to hold eye position out of midline;Requires cues, head turns, or add eye shifts to track;Impaired - to be further tested in functional context Visual Fields: Left visual field deficit Depth Perception: Overshoots Additional Comments: Difficult to accurately assess vision due to cognitive deficits Perception  Perception: Impaired Inattention/Neglect: Impaired-to be further tested in functional context Praxis Praxis: Impaired Praxis Impairment Details: Initiation;Motor planning;Ideomotor;Perseveration;Ideation Cognition Overall Cognitive Status: Impaired/Different from baseline Arousal/Alertness: Lethargic Orientation Level: Oriented to person;Disoriented to situation;Disoriented to time;Disoriented to place Attention: Sustained;Selective;Focused Focused Attention: Impaired Focused Attention Impairment: Verbal basic;Functional basic Sustained Attention: Impaired Sustained Attention Impairment: Functional basic;Verbal basic Selective Attention: Impaired Selective Attention Impairment: Verbal basic;Functional basic Memory: Impaired Decreased Short Term Memory: Verbal basic;Functional basic Problem Solving: Impaired Problem Solving Impairment: Functional basic;Verbal basic Behaviors: Restless;Impulsive;Perseveration Safety/Judgment: Impaired Sensation Sensation Light Touch: Appears Intact (Difficult to assess due to cognitive deficits) Hot/Cold: Appears Intact Proprioception: Impaired by gross assessment Stereognosis: Impaired Detail Stereognosis Impaired Details: Impaired LUE;Impaired RUE Additional Comments: unable to thoroughly assess due to cognition Coordination Gross Motor Movements are Fluid and Coordinated: No Fine Motor Movements are Fluid and  Coordinated: No Coordination and Movement Description: Blocked movement patterns, decreased attention to L, difficult to discern due to cognitive impairments Motor  Motor Motor: Hemiplegia;Abnormal postural alignment and control Motor - Discharge Observations: Improved since date of evaluation but difficult to assess to cog Mobility  Bed Mobility Bed Mobility: Rolling Right;Rolling Left;Supine to Sit;Sit to Supine Rolling Right: Supervision/verbal cueing Rolling Left: Supervision/Verbal cueing Supine to Sit: Supervision/Verbal cueing Sit to Supine: Supervision/Verbal cueing Transfers Sit to Stand: Contact Guard/Touching assist Stand to Sit: Contact Guard/Touching assist  Trunk/Postural Assessment  Postural Control Righting Reactions: delayed and inadequate Protective Responses: delayed and inadequate - very impaired awareness impacts protective  responses  Balance Static Sitting Balance Static Sitting - Level of Assistance: 5: Stand by assistance Dynamic Sitting Balance Dynamic Sitting - Level of Assistance: 4: Min assist Static Standing Balance Static Standing - Level of Assistance: 4: Min assist (CGA) Dynamic Standing Balance Dynamic Standing - Level of Assistance: 3: Mod assist Extremity/Trunk Assessment RUE Assessment General Strength Comments: Full AROM, 3+/5 MMT LUE Assessment General Strength Comments: Shoulder flexion limited to 120 degrees, 3/5 MMT   Stacy Moran 05/20/2020, 1:22 PM

## 2020-05-20 NOTE — Discharge Instructions (Signed)
Inpatient Rehab Discharge Instructions  Stacy Moran Discharge date and time: No discharge date for patient encounter.   Activities/Precautions/ Functional Status: Activity: activity as tolerated Diet: Osmolite 355 mL 3 times daily by tube.  Free water 200 mL every 6 hours by tube Wound Care: Routine skin checks Functional status:  ___ No restrictions     ___ Walk up steps independently ___ 24/7 supervision/assistance   ___ Walk up steps with assistance ___ Intermittent supervision/assistance  ___ Bathe/dress independently ___ Walk with walker     _x__ Bathe/dress with assistance ___ Walk Independently    ___ Shower independently ___ Walk with assistance    ___ Shower with assistance ___ No alcohol     ___ Return to work/school ________  Special Instructions:  No smoking or alcohol  Continue humidified air for trach care and follow-up Dr. Constance Holster [phone] at the trach clinic   Follow-up tracheostomy clinic [name]:   UNABLE  TO GET [place] DUE TO DOESN'T TAKE TRACH'S-WOULD NOT TAKE REFERRAL   Medical Equipment/Items Ordered:HUMIDIFED AIR, TRACH SUPPLIES, SUCTION MACHINE, WHEELCHAIR, TUB BENCH, 3 IN 1 Stacy Moran                                                 Agency/Supplier:[facility]  [phone]  SOCIAL SECURITY DISABILITY-DAUGHTER TO APPLY ON-LINE FOR PATIENT MEDICAID PENDING-Stacy Moran [phone] MED ASSIST WORKING ON Stacy Moran [phone] WILL WORK WITH ON PATIENT'S LOST PROSTHETIC EYE  My questions have been answered and I understand these instructions. I will adhere to these goals and the provided educational materials after my discharge from the hospital.  Patient/Caregiver Signature _______________________________ Date __________  Clinician Signature _______________________________________ Date __________  Please bring this form and your medication list with  you to all your follow-up doctor's appointments.

## 2020-05-20 NOTE — Progress Notes (Signed)
Occupational Therapy Session Note  Patient Details  Name: Stacy Moran MRN: [id number] Date of Birth: [birth year]  Today's Date: 05/20/2020 OT Individual Time: 7510-2585 OT Individual Time Calculation (min): 55 min    Short Term Goals: Week 1:  OT Short Term Goal 1 (Week 1): Pt will sit EOB with no LOB during 1 ADL task OT Short Term Goal 1 - Progress (Week 1): Met OT Short Term Goal 2 (Week 1): Pt will complete bathroom transfer with min HHA OT Short Term Goal 2 - Progress (Week 1): Progressing toward goal OT Short Term Goal 3 (Week 1): Pt will complete LB dressing with min A OT Short Term Goal 3 - Progress (Week 1): Progressing toward goal OT Short Term Goal 4 (Week 1): Pt will require no more than min cueing for focused attention to ADL task OT Short Term Goal 4 - Progress (Week 1): Progressing toward goal Week 2:  OT Short Term Goal 1 (Week 2): Pt will complete bathroom transfer with min HHA OT Short Term Goal 1 - Progress (Week 2): Met OT Short Term Goal 2 (Week 2): Pt will complete LB dressing with min A OT Short Term Goal 2 - Progress (Week 2): Met OT Short Term Goal 3 (Week 2): Pt will require no more than min cueing for focused attention to ADL task OT Short Term Goal 3 - Progress (Week 2): Not progressing OT Short Term Goal 4 (Week 2): Pt will complete sinkside bathing with min assist and mod cueing. OT Short Term Goal 4 - Progress (Week 2): Met Week 3:  OT Short Term Goal 1 (Week 3): Pt will complete stand pivot transfers without grabbing at her lines and leads to demonstrate improved attention. OT Short Term Goal 1 - Progress (Week 3): Met OT Short Term Goal 2 (Week 3): Pt will demonstrate improved attention by bathing UB with no more than 4 cues to attend to task. OT Short Term Goal 2 - Progress (Week 3): Not met OT Short Term Goal 3 (Week 3): Pt will don pants over feet with S and over hips with CGA. OT Short Term Goal 3 - Progress (Week 3): Not met Week 4:  OT Short  Term Goal 1 (Week 4): STGs=LTGs due to ELOS  Skilled Therapeutic Interventions/Progress Updates:    Pt received in bed she had just been cleaned up by nursing after having an accident in bed. Pt provided with a pair of pants and tried to encourage her to sit to EOB to don pants.  Instead pt took pants and placed over her feet while she was supine. Pt laughing and smiling when I said, "well Stacy Moran you are being very creative with getting those pants on". She then pulled them over her hips in bed.   She did sit to EOB with encouragement only and worked on applying lotion to feet and arms with cues to fully attend to L side.  Donned a zip up jacket with min to start L arm in sleeve.  Stand pivot with min with no 2nd person present to wc.    In wc, engaged in AROM "w/c dance" style exercises while music playing on the computer.  She did follow along fairly well but continues to demonstrate severely impaired proprioception of LUE and mod impaired praxis of LUE.  Pt was very calm this session, no grabbing or pulling on items.  She continues to have secretions. Pt resting in wc with belt alarm, posey belt and all needs met.  RN aware that her pulse ox was not reading well.   Therapy Documentation Precautions:  Precautions Precautions: Fall Precaution Comments: trach 28%/5L; pelvic restraint in bed, PMSV with staff, very low voice quality Required Braces or Orthoses: Other Brace Other Brace: abdominal binder - for PEG Restrictions Weight Bearing Restrictions: No    Vital Signs: Therapy Vitals Pulse Rate: 92 Resp: 18 BP: (!) 146/88 Patient Position (if appropriate): Lying Oxygen Therapy SpO2: (!) 9 % O2 Device: Tracheostomy Collar O2 Flow Rate (L/min): 5 L/min FiO2 (%): 21 % Pain: Pain Assessment Pain Score: 0-No pain        Therapy/Group: Individual Therapy  SAGUIER,JULIA 05/20/2020, 12:04 PM

## 2020-05-20 NOTE — Discharge Summary (Signed)
Physical Therapy Discharge Summary  Patient Details  Name: Stacy Moran MRN: [id number] Date of Birth: 07-14-1955  Today's Date: 05/20/2020 PT Individual Time: 1100-1158 PT Individual Time Calculation (min): 58 min    Patient has met 8 of 11 long term goals due to improved activity tolerance, improved postural control and ability to compensate for deficits.  Patient to discharge at a wheelchair level [place].   Patient's care partner is independent to provide the necessary physical and cognitive assistance at discharge.  Reasons goals not met: Pt able to ambulate >186f but she required +2 assist due to her impulsivity and poor safety awareness. She was also unable to navigate 12 steps, but able to complete x8 with modA, limited by fatigue.  Recommendation:  Patient will benefit from ongoing skilled PT services in home health setting to continue to advance safe functional mobility, address ongoing impairments in functional mobility, safety awareness, impulsivity, and ability to safely navigate her home in order to minimize fall risk and reduce caregiver burden. Unfortunately, pt is uninsured.   Equipment: 16x18 w/c, hospital bed  Reasons for discharge: treatment goals met and discharge from hospital  Patient/family agrees with progress made and goals achieved: Yes  PT Discharge Precautions/Restrictions Precautions Precautions: Fall Precaution Comments: trach 28%/5L; impulsive, occasionally restless, very low and poor voice quality Required Braces or Orthoses: Other Brace Other Brace: abdominal binder - for PEG Restrictions Weight Bearing Restrictions: No Vision/Perception  Vision - Assessment Additional Comments: Difficult to accurately assess vision due to cognitive deficits Perception Perception: Impaired Inattention/Neglect: Impaired-to be further tested in functional context Praxis Praxis: Impaired Praxis Impairment Details: Initiation;Motor  planning;Ideomotor;Perseveration;Ideation  Cognition Overall Cognitive Status: Impaired/Different from baseline Arousal/Alertness: Lethargic Orientation Level: Oriented to person;Disoriented to situation;Disoriented to time;Disoriented to place Attention: Sustained;Selective;Focused Focused Attention: Impaired Focused Attention Impairment: Verbal basic;Functional basic Sustained Attention: Impaired Sustained Attention Impairment: Functional basic;Verbal basic Selective Attention: Impaired Selective Attention Impairment: Verbal basic;Functional basic Memory: Impaired Decreased Short Term Memory: Verbal basic;Functional basic Problem Solving: Impaired Problem Solving Impairment: Functional basic;Verbal basic Behaviors: Restless;Impulsive;Perseveration Safety/Judgment: Impaired Sensation Sensation Light Touch: Appears Intact (Difficult to assess due to cognitive deficits) Hot/Cold: Appears Intact Proprioception: Impaired by gross assessment Stereognosis: Not tested Additional Comments: unable to thoroughly assess due to cognition Coordination Gross Motor Movements are Fluid and Coordinated: No Fine Motor Movements are Fluid and Coordinated: No Coordination and Movement Description: Blocked movement patterns, decreased attention to L, difficult to discern due to cognitive impairments Motor  Motor Motor: Hemiplegia;Abnormal postural alignment and control Motor - Discharge Observations: Improved since date of evaluation but difficult to assess to cog  Mobility Bed Mobility Bed Mobility: Rolling Right;Rolling Left;Supine to Sit;Sit to Supine Rolling Right: Supervision/verbal cueing Rolling Left: Supervision/Verbal cueing Supine to Sit: Supervision/Verbal cueing Sit to Supine: Supervision/Verbal cueing Transfers Transfers: Sit to Stand;Stand to Sit;Stand Pivot Transfers Sit to Stand: Contact Guard/Touching assist Stand to Sit: Contact Guard/Touching assist Stand Pivot Transfers:  Minimal Assistance - Patient > 75% Stand Pivot Transfer Details: Verbal cues for precautions/safety;Verbal cues for technique;Visual cues/gestures for sequencing;Verbal cues for sequencing;Verbal cues for safe use of DME/AE;Tactile cues for posture;Tactile cues for placement;Tactile cues for weight shifting;Tactile cues for sequencing;Tactile cues for initiation;Manual facilitation for weight shifting Transfer (Assistive device): 1 person hand held assist Locomotion  Gait Ambulation: Yes Gait Assistance: 2 Helpers Gait Distance (Feet): 200 Feet Assistive device: 1 person hand held assist (+2 assist for w/c follow and line management) Gait Assistance Details: Verbal cues for gait pattern;Verbal cues for precautions/safety;Tactile cues for sequencing;Tactile  cues for weight shifting;Tactile cues for posture;Visual cues for safe use of DME/AE;Visual cues/gestures for precautions/safety;Visual cues/gestures for sequencing;Verbal cues for sequencing;Verbal cues for technique;Verbal cues for safe use of DME/AE Gait Gait: Yes Gait Pattern: Impaired Gait Pattern: Right foot flat;Left foot flat;Decreased hip/knee flexion - left;Decreased hip/knee flexion - right;Trunk flexed;Narrow base of support;Decreased weight shift to left;Left genu recurvatum;Scissoring;Poor foot clearance - left;Poor foot clearance - right;Lateral hip instability Gait velocity: decr Stairs / Additional Locomotion Stairs: Yes Stairs Assistance: Moderate Assistance - Patient 50 - 74% Stair Management Technique: Two rails Number of Stairs: 8 Height of Stairs: 6 Wheelchair Mobility Wheelchair Mobility: Yes Wheelchair Assistance: Chartered loss adjuster: Both lower extermities Wheelchair Parts Management: Needs assistance Distance: 173ft  Trunk/Postural Assessment  Cervical Assessment Cervical Assessment: Exceptions to [facility] (forward head) Thoracic Assessment Thoracic Assessment: Exceptions to [facility]  (rounded shoulders) Lumbar Assessment Lumbar Assessment: Exceptions to [facility] (post pelvic tilt) Postural Control Postural Control: Deficits on evaluation Righting Reactions: delayed and inadequate Protective Responses: delayed and inadequate - very impaired awareness impacts protective responses  Balance Balance Balance Assessed: Yes Static Sitting Balance Static Sitting - Balance Support: Feet supported Static Sitting - Level of Assistance: 5: Stand by assistance Dynamic Sitting Balance Dynamic Sitting - Balance Support: Feet supported Dynamic Sitting - Level of Assistance: 4: Min assist Static Standing Balance Static Standing - Level of Assistance: 4: Min assist (CGA) Dynamic Standing Balance Dynamic Standing - Balance Support: During functional activity Dynamic Standing - Level of Assistance: 3: Mod assist Extremity Assessment  RLE Assessment General Strength Comments: Limited ability to assess thoroughly due to difficulty following simple commands. Grossly 4-/5 LLE Assessment General Strength Comments: Limited ability to assess thoroughly due to difficulty following simple commands Grossly 4-/5  Skilled Intervention: Pt received sitting upright in w/c with pelvic restraint and safety belt alarm on, connected to 28% FiO2 wall via trach mask. Removed pelvic restraint and safety belt with totalA. Sit<>stand with minA and no AD, ambulated ~73ft to bathroom with min/modA and HHA and required mod cues for safety approach to toilet as she naturally wants to sit prior to being fully over the surface. Pt continent of bladder and able to manage pericare with setupA, charted in flowsheets. Sit<>stand with minA (for powering to riise) from regular toilet height and she ambulated with minA back to her w/c. Wheeled sinkside for her to complete hand hygiene while sitting in w/c, requiring min cues for planning her steps. Pt propelled herself ~172ft on level surfaces with supervision in her w/c, using  BLE's only. Cues needed for initiation, turning, and general sequencing. Performed car transfer, requiring minA and patient self selected lateral stepping into the car, able to manage her LE's indep. Required minA for car transfer out of the car to her w/c; continues to demonstrate poor safety awareness with transfers. Wheeled to ADL apartment room and she completed w/c to (regular) bed with CGA and no AD. Able to perform sit>supine with supervision without bed features with mild difficulty managing her L leg. Stand<>pivot back to her w/c with CGA and then wheeled to stairs. She navigated up/down x8 steps with bilateral hand rails, required modA for safety, steadying, and cues for sequencing (self selected reciprocal pattern). Pt then wheeled to [place] system; completed tracing large letter's (X, C, and K) using the stilus - required max cues for sequencing and she was able to correctly trace ~25% of letter's. Pt returned to her room, ended session seated in w/c and reconnected to humidified wall O2  FiO2 28%. Donned pelvic restraint and safety belt alarm, tele sitter also present. Provided external suction via yaunker x3 times during session due to thick secretions.   Yamileth Hayse P Tsuruko Murtha PT 05/20/2020, 7:34 AM

## 2020-05-21 ENCOUNTER — Encounter: Payer: Self-pay | Admitting: Internal Medicine

## 2020-05-21 LAB — GLUCOSE, CAPILLARY
Glucose-Capillary: 79 mg/dL (ref 70–99)
Glucose-Capillary: 126 mg/dL — ABNORMAL HIGH (ref 70–99)
Glucose-Capillary: 148 mg/dL — ABNORMAL HIGH (ref 70–99)
Glucose-Capillary: 175 mg/dL — ABNORMAL HIGH (ref 70–99)
Glucose-Capillary: 93 mg/dL (ref 70–99)
Glucose-Capillary: 167 mg/dL — ABNORMAL HIGH (ref 70–99)

## 2020-05-21 NOTE — Progress Notes (Signed)
Provided discharge education with medications, how to administer medications, times and supplies. Daughter verbalized an understanding,also provided handouts for the above, Discharge instructions provided by Silvestre Mesi, [place]. With verbal understanding

## 2020-05-21 NOTE — Progress Notes (Signed)
Inpatient Rehabilitation Care Coordinator Discharge Note  The overall goal for the admission was met for:   Discharge location: Yes-HOME WITH DAUGHTER AND SON IN-LAW  Length of Stay: Yes-29 DAYS  Discharge activity level: Yes-SUPERVISION-MIN LEVEL OF ASSIST  Home/community participation: Yes  Services provided included: MD, RD, PT, OT, SLP, RN, CM, Pharmacy, Neuropsych and SW  Financial Services: Other: PENDING MEDICAID  Choices offered to/list presented to:YES  Follow-up services arranged: DME: [facility]-TRACH SUPPLIES, TUBE FEEDINGS, 3 IN1, WHEELCHAIR, HUMIDIFIED AIR, TUB BENCH and Patient/Family has no preference for HH/DME agencies [place].  Comments (or additional information):[name] AND HER HUSBAND-KEITH WERE HERE [facility] EDUCATION DAY OF DC. [name] WAS HERE PAST TWO Saturday'S FOR EDUCATION WITH STAFF. MEDICAID AND SSD PENDING. 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 DAUGHTER AWARE OF PT;S NEED FOR 24/7 CARE. DAUGHTER AND SON IN-LAW DID WELL WITH [facility] CARE AND EDUCATION. PT WILL BE A HIGH RISK TO RE-ADMIT TO [facility] ISSUES-TRACH, PEG AND SECRETIONS.   Patient/Family verbalized understanding of follow-up arrangements: Yes  Individual responsible for coordination of the follow-up plan: [name]-[phone]  Confirmed correct DME delivered: Elease Hashimoto 05/21/2020    [place], Gardiner Rhyme

## 2020-05-21 NOTE — Progress Notes (Signed)
Stacy Moran PHYSICAL MEDICINE & REHABILITATION PROGRESS NOTE  Subjective/Complaints: Patient seen sitting up in bed this morning. She indicates she slept well overnight. She appears aware of plans for d/c.  ROS: limited by cognition.  Objective: Vital Signs: Blood pressure 134/86, pulse 88, temperature 97.9 F (36.6 C), temperature source Axillary, resp. rate 18, height 5\' 9"  (1.753 m), weight 59.3 kg, SpO2 98 %. No results found. Recent Labs    05/19/20 0557  WBC 5.7  HGB 11.1*  HCT 36.7  PLT 136*   Recent Labs    05/20/20 0508  NA 139  K 4.7  CL 99  CO2 31  GLUCOSE 149*  BUN 26*  CREATININE 0.68  CALCIUM 10.1   No intake or output data in the 24 hours ending 05/21/20 0859      Physical Exam: BP 134/86 (BP Location: Left Arm)   Pulse 88   Temp 97.9 F (36.6 C) (Axillary)   Resp 18   Ht 5\' 9"  (1.753 m)   Wt 59.3 kg   SpO2 98%   BMI 19.31 kg/m   Constitutional: No distress . Vital signs reviewed. HENT: Normocephalic.  Atraumatic. Eyes: Right eye. EOMI. No discharge. Neck: #6 trach with trach collar Cardiovascular: No JVD.  RRR. Respiratory: Normal effort.  No stridor.  Bilateral clear to auscultation. GI: Non-distended.  BS +. + PEG. Skin: Warm and dry.  Intact. Psych: Limited due to cognition. Musc: No edema in extremities.  No tenderness in extremities. Neuro: Alert Motor: 4+-5/5 throughout, appears stable  Assessment/Plan: 1. Functional deficits which require 3+ hours per day of interdisciplinary therapy in a comprehensive inpatient rehab setting.  Physiatrist is providing close team supervision and 24 hour management of active medical problems listed below.  Physiatrist and rehab team continue to assess barriers to discharge/monitor patient progress toward functional and medical goals   Care Tool:  Bathing    Body parts bathed by patient: Right arm,Left arm,Chest,Abdomen,Right upper leg,Left upper leg,Right lower leg,Left lower leg,Face,Front  perineal area,Buttocks   Body parts bathed by helper: Right arm,Left arm,Chest,Abdomen     Bathing assist Assist Level: Contact Guard/Touching assist     Upper Body Dressing/Undressing Upper body dressing   What is the patient wearing?: Pull over shirt    Upper body assist Assist Level: Supervision/Verbal cueing    Lower Body Dressing/Undressing Lower body dressing      What is the patient wearing?: Incontinence brief,Pants     Lower body assist Assist for lower body dressing: Contact Guard/Touching assist     Toileting Toileting Toileting Activity did not occur (Clothing management and hygiene only): Refused  Toileting assist Assist for toileting: Minimal Assistance - Patient > 75%     Transfers Chair/bed transfer  Transfers assist     Chair/bed transfer assist level: Minimal Assistance - Patient > 75%     Locomotion Ambulation   Ambulation assist      Assist level: Minimal Assistance - Patient > 75% Assistive device: Hand held assist Max distance: 52ft   Walk 10 feet activity   Assist  Walk 10 feet activity did not occur: Refused  Assist level: Minimal Assistance - Patient > 75% Assistive device: Hand held assist   Walk 50 feet activity   Assist Walk 50 feet with 2 turns activity did not occur: Refused  Assist level: 2 helpers Assistive device: Hand held assist    Walk 150 feet activity   Assist Walk 150 feet activity did not occur: Refused  Assist level: 2  helpers Assistive device: Hand held assist    Walk 10 feet on uneven surface  activity   Assist Walk 10 feet on uneven surfaces activity did not occur: Safety/medical concerns         Wheelchair     Assist Will patient use wheelchair at discharge?: Yes Type of Wheelchair: Manual Wheelchair activity did not occur: Refused  Wheelchair assist level: Supervision/Verbal cueing Max wheelchair distance: 132ft    Wheelchair 50 feet with 2 turns  activity    Assist        Assist Level: Supervision/Verbal cueing   Wheelchair 150 feet activity     Assist  Wheelchair 150 feet activity did not occur: Refused   Assist Level: Total Assistance - Patient < 25%   Medical Problem List and Plan: 1. 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  DC today  Will see patient for transitional care management in 1-2 weeks post-discharge 2.  Antithrombotics: -DVT/anticoagulation: Continue Subcutaneous heparin             -antiplatelet therapy: N/A 3. Pain Management: Tylenol as needed 4. Mood: Provide emotional support             -antipsychotic agents: Seroquel 50 mg twice daily; additional 25mg  Seroquel PRN added for agitation.    Ativan changed to Klonopin - continue to evaluate lethargy   Renewed restraints for patient safety as well as 1:1 for observation            -pt is fall risk 5. Neuropsych: This patient is not capable of making decisions on her own behalf.  Ritalin twice daily started on 2/2 with improvement, increased on 2/9, continue to monitor-decreased due to restlessness after discussion with therapies  Telesitter for safety 6. Skin/Wound Care: Routine skin checks 7. Fluids/Electrolytes/Nutrition: Routine in and outs  BMP within acceptable range on 2/22 8. Seizure prophylaxis.   Keppra decreased to 500 BID on 1/28, decreased further on 2/2, DC'd on 2/7 9. 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 Constance Holster.   Continues on #6 cuffless Shiley currently, per ENT, holding on downsize d/t secretions.   Glycopyrolate initiated, increased on 2/17  Discussed with ENT, no further recommendations at this time, continue  current care and follow-up in trach clinic.  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 Earleen Newport.       Dietary and speech therapy follow-up  Changed tube feeds to bolus feeds-discussed with dietary.  Advanced diet as tolerated 11. Hyperglycemia related to tube feeds. SSI   CBG (last 3)  Recent Labs    05/20/20 1156 05/20/20 1530 05/21/20 0359  GLUCAP 148* 77 79   Controlled on 2/23 12. Hypothyroidism: Syndthroid 42. Hypertension. Hydralazine 50 mg every 8 hours, Normodyne 300 mg 3 times daily, Norvasc 10 mg daily   Vitals:   05/21/20 0415 05/21/20 0838  BP:    Pulse: 74 88  Resp: 16 18  Temp:    SpO2: 100% 98%   Controlled on 2/23  Monitor with increased mobility 13.  Acute blood loss anemia: Resolved  Hemoglobin 12.4 on 2/13  Continue to monitor 14. Slow transit constipation:   Improving 15.  Thrombocytopenia  Platelets 136 on 2/21  Continue to monitor  > 30  minutes spent in total in discharge planning between myself and PA regarding aforementioned, as well discussion regarding DME equipment, follow-up appointments, follow-up therapies, discharge medications, discharge recommendations, answering questions.  Please see discharge summary as well.  LOS: 29 days A FACE TO FACE EVALUATION WAS PERFORMED  Ankit Lorie Phenix 05/21/2020, 8:59 AM

## 2020-05-21 NOTE — Progress Notes (Signed)
RT NOTES: Home training done with daughter. Home care booklet given. Appointments for covid screening and trach clinic given to daughter. Daughter states understanding. Daughter suctioned patient. Daughter has no further questions

## 2020-05-23 ENCOUNTER — Telehealth: Payer: Self-pay | Admitting: *Deleted

## 2020-05-23 NOTE — Telephone Encounter (Signed)
Transitional Care call--daughter [name]    1. Are you/is patient experiencing any problems since coming home? Are there any questions regarding any aspect of care?  "NO things are going pretty well" 2. Are there any questions regarding medications administration/dosing? Are meds being taken as prescribed? Patient should review meds with caller to confirm Confirms all medications except the food nutritional suppliment 3. Have there been any falls? Yes , no injury. She is fidgety and tries to get up and "mess with things, so [name] has her folding laundry to occupy her" 4. Has Home Health been to the house and/or have they contacted you? If not, have you tried to contact them? Can we help you contact them? N/A does not qualify- medicaid pending and [place] does not handle trachs. 5. Are bowels and bladder emptying properly? Are there any unexpected incontinence issues? If applicable, is patient following bowel/bladder programs? NO PROBLEMS 6. Any fevers, problems with breathing, unexpected pain? NO PROBLEMS 7. Are there any skin problems or new areas of breakdown? NO 8. Has the patient/family member arranged specialty MD follow up (ie cardiology/neurology/renal/surgical/etc)?  Can we help arrange? Has mychart set up and knows about appt with DR Posey Pronto on 05/26/20. Has appt scheduled with PCP and trach clinic.  Will call Dr Kathyrn Sheriff and IR Dr Jacqualyn Posey for appts. 9. Does the patient need any other services or support that we can help arrange? NO 10. Are caregivers following through as expected in assisting the patient? YES 11. Has the patient quit smoking, drinking alcohol, or using drugs as recommended? YES  Appointment Monday 05/26/20 with Dr Posey Pronto arrive at 1:40 for 2:00 appt. Familiar with address, daughter used to work for [employer] in the building [place]

## 2020-05-26 ENCOUNTER — Other Ambulatory Visit (HOSPITAL_COMMUNITY)
Admit: 2020-05-26 | Discharge: 2020-05-26 | Disposition: A | Discharge status: Home / Self Care | Payer: Self-pay | Attending: Otolaryngology | Admitting: Otolaryngology

## 2020-05-26 ENCOUNTER — Encounter: Payer: Self-pay | Attending: Registered Nurse | Admitting: Physical Medicine & Rehabilitation

## 2020-05-26 ENCOUNTER — Encounter: Payer: Self-pay | Admitting: Physical Medicine & Rehabilitation

## 2020-05-26 ENCOUNTER — Telehealth: Payer: Self-pay

## 2020-05-26 ENCOUNTER — Other Ambulatory Visit: Payer: Self-pay

## 2020-05-26 VITALS — BP 129/86 | HR 83 | Temp 99.3°F | Ht 69.0 in | Wt 131.2 lb

## 2020-05-26 DIAGNOSIS — Z01812 Encounter for preprocedural laboratory examination: Secondary | ICD-10-CM | POA: Insufficient documentation

## 2020-05-26 DIAGNOSIS — I729 Aneurysm of unspecified site: Secondary | ICD-10-CM

## 2020-05-26 DIAGNOSIS — I10 Essential (primary) hypertension: Secondary | ICD-10-CM

## 2020-05-26 DIAGNOSIS — I69391 Dysphagia following cerebral infarction: Secondary | ICD-10-CM

## 2020-05-26 DIAGNOSIS — Z20822 Contact with and (suspected) exposure to covid-19: Secondary | ICD-10-CM | POA: Insufficient documentation

## 2020-05-26 DIAGNOSIS — Z931 Gastrostomy status: Secondary | ICD-10-CM

## 2020-05-26 DIAGNOSIS — R451 Restlessness and agitation: Secondary | ICD-10-CM

## 2020-05-26 DIAGNOSIS — I609 Nontraumatic subarachnoid hemorrhage, unspecified: Secondary | ICD-10-CM

## 2020-05-26 DIAGNOSIS — Z930 Tracheostomy status: Secondary | ICD-10-CM

## 2020-05-26 NOTE — Progress Notes (Signed)
Subjective:    Patient ID: Stacy Moran, female    DOB: 1955-10-05, 65 y.o.   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  Daughter provides history. At discharge, pt was instructed to follow up with ENT, with whom she has an appointment this week.  They are trying to make an appointment with Neurosurg.  She has an appointment with PCP. Agitation has been controlled. She remains impulsive.  She has fallen due to being impulsive. She continues to have secretion due to trach. BP has been controlled.  Therapies: Unable due to lack of insurance  DME; Shower chair, suction, humidified air Mobility: With safety belt and furniture  Pain Inventory Average Pain 0 Pain Right Now 0 My pain is no pain  LOCATION OF PAIN  No pain  BOWEL Number of stools per week: 3 Oral laxative use No  Type of laxative n/a  Enema or suppository use No  History of colostomy No  Incontinent No   BLADDER Normal In and out cath, frequency no cath Able to self cath No  Bladder incontinence Yes  Frequent urination No  Leakage with coughing No  Difficulty starting stream No  Incomplete bladder emptying No    Mobility walk with assistance use a walker ability to climb steps?  yes do you drive?  no use a wheelchair Do you have any goals in this area?  yes  Function not employed: date last employed . I need assistance with the following:  feeding, dressing, bathing, toileting and household duties  Neuro/Psych weakness trouble walking confusion  Prior Studies hospital f/u  Physicians involved in your care hospital  f/u   History reviewed. No pertinent family history. Social History   Socioeconomic History  . Marital status: Unknown    Spouse name: Not on file  . Number of children: Not on file  . Years of education: Not on file  . Highest education level: Not on file  Occupational History  . Not on file  Tobacco Use  . Smoking status: Former Research scientist (life sciences)  . Smokeless tobacco: Never Used  Vaping Use  . Vaping Use: Never used  Substance and Sexual Activity  . Alcohol use: Not Currently  . Drug use: Not Currently  . Sexual activity: Not on file  Other Topics Concern  . Not on file  Social History Narrative  . Not on file   Social Determinants of Health   Financial Resource Strain: Not on file  Food Insecurity: Not on file  Transportation Needs: Not on file  Physical Activity: Not on file  Stress: Not on file  Social Connections: Not on file   Past Surgical History:  Procedure Laterality Date  . BURR HOLE N/A 03/18/2020   Procedure: Haskell Flirt;  Surgeon: [name], MD;  Location: [facility];  Service: Neurosurgery;  Laterality: N/A;  . CRANIOTOMY Left 01/30/2020   Procedure: LEFT FAR LATERAL CRANIOTOMY FOR ANEURYSM CLIPPING;  Surgeon: [name], MD;  Location: [facility];  Service: Neurosurgery;  Laterality: Left;  . DIRECT LARYNGOSCOPY N/A 02/29/2020   Procedure: DIRECT LARYNGOSCOPY;  Surgeon: [name], MD;  Location: [facility];  Service: ENT;  Laterality: N/A;  . IR ANGIO INTRA EXTRACRAN SEL INTERNAL CAROTID BILAT  MOD SED  01/30/2020  . IR ANGIO VERTEBRAL SEL VERTEBRAL UNI L MOD SED  01/30/2020  . IR GASTROSTOMY TUBE MOD SED  02/22/2020  . LAPAROSCOPIC REVISION VENTRICULAR-PERITONEAL (V-P) SHUNT N/A 03/18/2020   Procedure: LAPAROSCOPIC REVISION VENTRICULAR-PERITONEAL (V-P) SHUNT;  Surgeon: [name], MD;  Location: [facility];  Service: General;  Laterality: N/A;  . RADIOLOGY WITH ANESTHESIA N/A 01/30/2020   Procedure: IR WITH ANESTHESIA;  Surgeon: [name], MD;   Location: [facility];  Service: Radiology;  Laterality: N/A;  . THYROIDECTOMY N/A 02/08/2020   Procedure: THYROIDECTOMY;  Surgeon: [name], MD;  Location: [facility];  Service: ENT;  Laterality: N/A;  . TRACHEOSTOMY TUBE PLACEMENT N/A 02/08/2020   Procedure: TRACHEOSTOMY;  Surgeon: [name], MD;  Location: [facility];  Service: ENT;  Laterality: N/A;  . TRACHEOSTOMY TUBE PLACEMENT N/A 02/29/2020   Procedure: TRACHEOSTOMY EXCHANGE;  Surgeon: [name], MD;  Location: [facility];  Service: ENT;  Laterality: N/A;  . VENTRICULOPERITONEAL SHUNT N/A 03/18/2020   Procedure: RIGHT SHUNT INSERTION VENTRICULAR-PERITONEAL/ BURR HOLE Evacuation of Subdural Hematoma;  Surgeon: [name], MD;  Location: [facility];  Service: Neurosurgery;  Laterality: N/A;   History reviewed. No pertinent past medical history. BP 129/86   Pulse 83   Temp 99.3 F (37.4 C)   SpO2 96%   Opioid Risk Score:   Fall Risk Score:  `1  Depression screen PHQ 2/9  Depression screen PHQ 2/9 05/26/2020  Decreased Interest 2  Down, Depressed, Hopeless 1  PHQ - 2 Score 3  Altered sleeping 2  Tired, decreased energy 2  Change in appetite 0  Feeling bad or failure about yourself  0  Trouble concentrating 3  Moving slowly or fidgety/restless 0  Suicidal thoughts 0  PHQ-9 Score 10    Review of Systems  Unable to perform ROS: Acuity of condition       Objective:   Physical Exam  Constitutional: No distress . Vital signs reviewed. HENT: Normocephalic.  Atraumatic. Eyes: EOMI. No discharge. Cardiovascular: No JVD.   Respiratory: Normal effort.  No stridor.   GI: Non-distended.  +PEG. Skin: Warm and dry.  Intact. Psych: Limited due to cognition.  Musc: No edema in extremities.  No tenderness in extremities. 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  1. 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  Encouraged HEP  Follow up with Neurosurg  Follow up with ENT  2. Agitation/Restless:   Continue Seroquel 50 mg twice daily, daughter does not want to increase medication for the time being  Continue Klonopin for now- will plan on weaning  3. Attention/concentration deficits:           Continue Ritalin 2.5 twice daily   4. 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 Constance Holster.              Cont to follow up with ENT- continues to have secretions  Continue Glycopyrolate   5.  Post stroke dysphagia:   Gastrostomy tube 02/22/2020 per interventional radiology Dr. Earleen Newport.       Remains NPO              6. Hypertension.   Cont meds  Controlled at present  Meds reviewed Referral reviewed - need Neurosurg appoinment All questions answered

## 2020-05-26 NOTE — Telephone Encounter (Signed)
Marland Kitchen a representative with [employer] called to verify dates on FMLA forms for [name] (daughter). Per Marland Kitchen the dates where changed and not signed.  I called the Social Worker's phone number and  was told the forms where faxed from the hospital,  after completion. The original where given back to [place] TEFL teacher).   Above information was left on [employer]. He may check to see if the received fax was from and [phone] phone number. Because Rajvi Armentor has not had her hospital follow up visit here at [facility]

## 2020-05-27 LAB — SARS CORONAVIRUS 2 (TAT 6-24 HRS): SARS Coronavirus 2: NEGATIVE

## 2020-05-29 ENCOUNTER — Ambulatory Visit (HOSPITAL_COMMUNITY)
Admission: RE | Admit: 2020-05-29 | Discharge: 2020-05-29 | Disposition: A | Discharge status: Home / Self Care | Payer: Medicaid Other | Source: Ambulatory Visit | Attending: Acute Care | Admitting: Acute Care

## 2020-05-29 ENCOUNTER — Other Ambulatory Visit: Payer: Self-pay

## 2020-05-29 DIAGNOSIS — Z930 Tracheostomy status: Secondary | ICD-10-CM

## 2020-05-29 DIAGNOSIS — I69098 Other sequelae following nontraumatic subarachnoid hemorrhage: Secondary | ICD-10-CM | POA: Insufficient documentation

## 2020-05-29 DIAGNOSIS — I69022 Dysarthria following nontraumatic subarachnoid hemorrhage: Secondary | ICD-10-CM | POA: Diagnosis not present

## 2020-05-29 DIAGNOSIS — I69093 Ataxia following nontraumatic subarachnoid hemorrhage: Secondary | ICD-10-CM | POA: Diagnosis not present

## 2020-05-29 DIAGNOSIS — R053 Chronic cough: Secondary | ICD-10-CM | POA: Insufficient documentation

## 2020-05-29 DIAGNOSIS — R0689 Other abnormalities of breathing: Secondary | ICD-10-CM | POA: Insufficient documentation

## 2020-05-29 DIAGNOSIS — I6901 Attention and concentration deficit following nontraumatic subarachnoid hemorrhage: Secondary | ICD-10-CM | POA: Insufficient documentation

## 2020-05-29 DIAGNOSIS — Z993 Dependence on wheelchair: Secondary | ICD-10-CM | POA: Insufficient documentation

## 2020-05-29 DIAGNOSIS — Z430 Encounter for attention to tracheostomy: Secondary | ICD-10-CM | POA: Diagnosis present

## 2020-05-29 DIAGNOSIS — Z982 Presence of cerebrospinal fluid drainage device: Secondary | ICD-10-CM | POA: Insufficient documentation

## 2020-05-29 NOTE — Progress Notes (Signed)
Tracheostomy Procedure Note  Zoe Creasman [id number] 09/14/1955  Pre Procedure Tracheostomy Information  Trach Brand: Shiley Size: 6.0 Flexible trach Style: Uncuffed Secured by: Velcro   Procedure: Trach Change and Trach cleaning    Post Procedure Tracheostomy Information  Trach Brand: Shiley Size: 6.0 flexible trach Style: Uncuffed Secured by: Velcro   Post Procedure Evaluation:  ETCO2 positive color change from yellow to purple : Yes.   Vital signs:VSS Patients current condition: stable Complications: No apparent complications Trach site exam: clean and dry Wound care done: 4 x 4 gauze drain Patient did tolerate procedure well.   Education: none  Prescription needs: none    Additional needs: Additional size 6 trach and PMV given to patient at this vist

## 2020-05-29 NOTE — Progress Notes (Signed)
Reason for visit: Plan tracheostomy change  HPI  Stacy Moran is well-known to me I followed her in the hospital for tracheostomy management after her subarachnoid hemorrhage she had a long hospital stay following her left PICA aneurysm clipping ultimately requiring VP shunt, tracheostomy, PEG tube, and stay in inpatient rehab.  She was just discharged to home about 1 week prior to this visit she is currently living with her daughter who is providing all aspects of her care.  She does appear much stronger from when I saw her last in the hospital.  She is nonverbal.  She does still require suctioning sometimes as often as 5 times a day.  Her daughter describes her secretions as thick but manageable with suction.  She remains quite weak, she has poor balance, and is very impulsive at home, her daughter has an alarm installed in her room given concern about fall.  She presents today for planned tracheostomy change  Review of systems Really unable to obtain however from questioning her daughter secretions have been fairly constant but manageable.  As noted above suction at least 4 sometimes 5 times a day.  No recent fevers, chills, no complaint of neck pain.  No shortness of breath that has not resolved with suctioning  Exam  65 year old black female resting in the wheelchair she is in no acute distress today HEENT normocephalic atraumatic the left eye is shut.  She has a size 6 cuffless tracheostomy in place tracheostomy is unremarkable she does have rhonchus upper airway noises, I could not get her to talk.  Tracheostomy stoma is unremarkable Pulmonary: Scattered rhonchi no accessory use currently on room air Cardiac regular rate and rhythm Abdomen soft nontender Neuro Sleepy, follows commands, not verbal.  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 is not a candidate for decannulation given her need for frequent suctioning and ineffective cough mechanics.  I am hopeful that with time this may change.  In the meantime I have discussed with her daughter the importance of trying to utilize Passy-Muir valve during the day to help "retrain" cough mechanics and overall upper airway conditioning  Plan Continue routine tracheostomy care Encourage Passy-Muir valve use during the day as much as tolerated however only under direct observation I think she needs some time to settle in at home, and also for her daughter to help establish a new home routine at that point we can reassess how she is doing on her next visit and possibly consider referral to SLP for swallowing  My time 45 minutes  Erick Colace ACNP-BC [place] Pager # [phone] OR # [phone] if no answer

## 2020-05-30 ENCOUNTER — Encounter: Payer: Self-pay | Admitting: Registered Nurse

## 2020-06-05 ENCOUNTER — Encounter: Payer: Self-pay | Admitting: Internal Medicine

## 2020-06-05 ENCOUNTER — Ambulatory Visit: Payer: Self-pay | Attending: Internal Medicine | Admitting: Internal Medicine

## 2020-06-05 ENCOUNTER — Other Ambulatory Visit: Payer: Self-pay

## 2020-06-05 VITALS — BP 127/80 | HR 89 | Resp 16 | Ht 66.0 in | Wt 131.2 lb

## 2020-06-05 DIAGNOSIS — R3981 Functional urinary incontinence: Secondary | ICD-10-CM

## 2020-06-05 DIAGNOSIS — Z7689 Persons encountering health services in other specified circumstances: Secondary | ICD-10-CM

## 2020-06-05 DIAGNOSIS — I69391 Dysphagia following cerebral infarction: Secondary | ICD-10-CM

## 2020-06-05 DIAGNOSIS — Z930 Tracheostomy status: Secondary | ICD-10-CM

## 2020-06-05 DIAGNOSIS — I10 Essential (primary) hypertension: Secondary | ICD-10-CM

## 2020-06-05 DIAGNOSIS — E890 Postprocedural hypothyroidism: Secondary | ICD-10-CM

## 2020-06-05 DIAGNOSIS — R7303 Prediabetes: Secondary | ICD-10-CM

## 2020-06-05 DIAGNOSIS — I6930 Unspecified sequelae of cerebral infarction: Secondary | ICD-10-CM

## 2020-06-05 DIAGNOSIS — Z931 Gastrostomy status: Secondary | ICD-10-CM

## 2020-06-05 DIAGNOSIS — Z982 Presence of cerebrospinal fluid drainage device: Secondary | ICD-10-CM

## 2020-06-05 NOTE — Progress Notes (Signed)
Patient ID: Stacy Moran, female    DOB: 11/06/1955  MRN: [id number]  CC: Hospitalization Follow-up   Subjective: Stacy Moran is a 65 y.o. female who presents for new patient visit and hospital follow-up.  Her daughter [name] is with her and provides most of the history.  Patient has significant dysarthria and difficulty talking with the trach. Her concerns today include:   Patient denies having any previous PCP and no known past medical issues. Patient hospitalized 01/30/2020-04/22/2020 then subsequently to inpatient rehab from 1/25-2/23/2022. Patient presented to the hospital in November with acute headache and became obtunded requiring intubation in the emergency room.  Found to have a left subarachnoid hemorrhage.  She underwent left ventriculostomy by Dr. Kathyrn Sheriff.  Hospital course was complicated by prolonged ventilatory support requiring tracheostomy as well as thyroidectomy by Dr. Constance Holster.  She failed swallow test so gastrostomy tube was placed 02/22/2020.  She develop SDH followed by laparoscopic-assisted insertion of ventriculoperitoneal shunt as well as left PICA aneurysm clipping with left occipital craniotomy and borehole evacuation of chronic subdural hematoma on 03/18/2020. She was transferred to inpatient rehab on 04/22/2020 where she had intensive PT/ST/OT.  She was placed on Seroquel twice a day for agitation with good results.  She was tapered off Keppra which was being used for seizure prophylaxis.  She was discharged home with #6 cuffless trach with plans to follow in trach clinic.  Plans are to continue PEG tube feedings.  Today: Subarachnoid hemorrhage: Patient is currently living with her daughter.  Her daughter is currently on FMLA.  She is hoping that her mother is approved for Medicaid/Medicare by the time her FMLA is up so that she will get PCS services and home therapies. -Daughter does her tube feedings with Osmolite and Prosource.  She gives water flushes of 200 cc  every 6 hours.  She is tolerating her tube feeds.  She is noted to be prediabetic on A1c is done in the hospital.  Patient's daughter states they were told it was due to the tube feeds.  Last A1c was 5.8. -The PEG tube site is clean with no drainage.  They have not had any problems with the tube being blocked.  Patient is unable to take any food via mouth.  Daughter states that she failed her swallow test prior to discharge. -Patient is sleeping okay with Seroquel. -Daughter states that she can sometimes be compulsive and wanting to get up from wheelchair unassisted.  Balance is wobbly.  She has a Rollator walker but can only ambulate short distances on flat surfaces with it.  The wheelchair is used more so when they have to travel outside of the home. -Patient wears depends more so for functional incontinence.  She can tell when she needs to urinate or have a bowel movement.  Daughter reported that she had not had a bowel movement in the past 7 days.  However while we were speaking patient asked to be excused that she needed to have go to the bathroom to have a bowel movement.  That bowel movement per the daughter was subsequently good. -Patient is seen Dr. Posey Pronto in [place] in follow-up on 05/26/2020.   Tracheostomy: Daughter reports that she requires frequent suctioning about every 2-3 hours of thick clear mucus.  She has not had any fever.  Patient denies any increased shortness of breath.  She has an appointment coming up with Dr. Constance Holster.  HTN: Patient takes hydralazine, labetalol and amlodipine.  Daughter limits salt in the foods.  Hypothyroidism: She is compliant with taking the levothyroxine.  Past surgical history reviewed. Social history: Patient currently staying with her daughter.  She does not smoke.  She does not use any street drugs. 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  However the prosthesis was lost during her  recent hospitalization.  Patient Active Problem List   Diagnosis Date Noted  . History of hemorrhagic cerebrovascular accident (CVA) with residual deficit 06/06/2020  . 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 S/P ventriculoperitoneal shunt 06/06/2020  . Functional urinary incontinence 06/06/2020  . Ineffective airway clearance   . Thrombocytopenia ([place])   . Restlessness   . Restlessness and agitation   . Acute blood loss anemia   . Essential hypertension   . Seizure prophylaxis   . Protein-calorie malnutrition, severe 04/24/2020  . Dysphagia, post-stroke   . Benign essential HTN   . Dysphagia   . Status post tracheostomy ([place])   . S/P percutaneous endoscopic gastrostomy (PEG) tube placement ([place])   . ICH (intracerebral hemorrhage) ([place]) 04/22/2020  . Obstructive hydrocephalus ([place])   . Seizure-like activity ([place]) 03/12/2020  . PEG (percutaneous endoscopic gastrostomy) status ([place]) 03/08/2020  . Hyperglycemia 03/08/2020  . Acute respiratory failure ([place])   . Ventilator dependence ([place])   . 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 Tracheostomy status ([place]) 02/19/2020  . Abdominal distention   . 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 SAH (subarachnoid hemorrhage) ([place])   . Subarachnoid bleed ([place])   . Tachypnea   . Prediabetes   . Hypokalemia   . Leukocytosis   . Ileus, postoperative ([place])   . Pressure injury of skin 02/13/2020  . 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 acetaminophen (TYLENOL) 325 MG tablet Place 2 tablets (650 mg total) into feeding tube every 6 (six) hours as needed for mild pain.    Marland Kitchen amLODipine (NORVASC) 10 MG tablet Place 1 tablet (10 mg total) into feeding tube daily. 30 tablet 0  . clonazePAM (KLONOPIN) 0.25 MG disintegrating tablet Place 1 tablet (0.25 mg total) into feeding tube 2 (two) times  daily. 60 tablet 0  . glycopyrrolate (ROBINUL) 1 MG tablet Place 2 tablets (2 mg total) into feeding tube 2 (two) times daily. 60 tablet 0  . hydrALAZINE (APRESOLINE) 50 MG tablet Place 1 tablet (50 mg total) into feeding tube every 8 (eight) hours. 90 tablet 0  . labetalol (NORMODYNE) 300 MG tablet Place 1 tablet (300 mg total) into feeding tube 3 (three) times daily. 90 tablet 0  . levothyroxine (SYNTHROID) 100 MCG tablet Place 1 tablet (100 mcg total) into feeding tube daily at 6 (six) AM. 30 tablet 0  . methylphenidate (RITALIN) 5 MG tablet Place 0.5 tablets (2.5 mg total) into feeding tube 2 (two) times daily with breakfast and lunch. 60 tablet 0  . Nutritional Supplements (FEEDING SUPPLEMENT, OSMOLITE 1.2 CAL,) LIQD Place 355 mLs into feeding tube 4 (four) times daily -  with meals and at bedtime.  0  . Nutritional Supplements (FEEDING SUPPLEMENT, PROSOURCE TF,) liquid Place 45 mLs into feeding tube 2 (two) times daily.    . pantoprazole sodium (PROTONIX) 40 mg/20 mL PACK Place 20 mLs (40 mg total) into feeding tube daily. 30 mL 0  . QUEtiapine (SEROQUEL) 50 MG tablet Place 1 tablet (  50 mg total) into feeding tube 2 (two) times daily. 60 tablet 0  . scopolamine (TRANSDERM-SCOP) 1 MG/3DAYS Place 1 patch (1.5 mg total) onto the skin every 3 (three) days. 10 patch 12  . Water For Irrigation, Sterile (FREE WATER) SOLN Place 200 mLs into feeding tube every 6 (six) hours.     No current facility-administered medications on file prior to visit.    No Known Allergies  Social History   Socioeconomic History  . Marital status: Unknown    Spouse name: Not on file  . Number of children: Not on file  . Years of education: Not on file  . Highest education level: Not on file  Occupational History  . Not on file  Tobacco Use  . Smoking status: Former Research scientist (life sciences)  . Smokeless tobacco: Never Used  Vaping Use  . Vaping Use: Never used  Substance and Sexual Activity  . Alcohol use: Not Currently  .  Drug use: Not Currently  . Sexual activity: Not on file  Other Topics Concern  . Not on file  Social History Narrative  . Not on file   Social Determinants of Health   Financial Resource Strain: Not on file  Food Insecurity: Not on file  Transportation Needs: Not on file  Physical Activity: Not on file  Stress: Not on file  Social Connections: Not on file  Intimate Partner Violence: Not on file    No family history on file.  Past Surgical History:  Procedure Laterality Date  . BURR HOLE N/A 03/18/2020   Procedure: Haskell Flirt;  Surgeon: [name], MD;  Location: [facility];  Service: Neurosurgery;  Laterality: N/A;  . CRANIOTOMY Left 01/30/2020   Procedure: LEFT FAR LATERAL CRANIOTOMY FOR ANEURYSM CLIPPING;  Surgeon: [name], MD;  Location: [facility];  Service: Neurosurgery;  Laterality: Left;  . DIRECT LARYNGOSCOPY N/A 02/29/2020   Procedure: DIRECT LARYNGOSCOPY;  Surgeon: [name], MD;  Location: [facility];  Service: ENT;  Laterality: N/A;  . IR ANGIO INTRA EXTRACRAN SEL INTERNAL CAROTID BILAT MOD SED  01/30/2020  . IR ANGIO VERTEBRAL SEL VERTEBRAL UNI L MOD SED  01/30/2020  . IR GASTROSTOMY TUBE MOD SED  02/22/2020  . LAPAROSCOPIC REVISION VENTRICULAR-PERITONEAL (V-P) SHUNT N/A 03/18/2020   Procedure: LAPAROSCOPIC REVISION VENTRICULAR-PERITONEAL (V-P) SHUNT;  Surgeon: [name], MD;  Location: [facility];  Service: General;  Laterality: N/A;  . RADIOLOGY WITH ANESTHESIA N/A 01/30/2020   Procedure: IR WITH ANESTHESIA;  Surgeon: [name], MD;  Location: [facility];  Service: Radiology;  Laterality: N/A;  . THYROIDECTOMY N/A 02/08/2020   Procedure: THYROIDECTOMY;  Surgeon: [name], MD;  Location: [facility];  Service: ENT;  Laterality: N/A;  . TRACHEOSTOMY TUBE PLACEMENT N/A 02/08/2020   Procedure: TRACHEOSTOMY;  Surgeon: [name], MD;  Location: [facility];  Service: ENT;  Laterality: N/A;  . TRACHEOSTOMY TUBE PLACEMENT N/A 02/29/2020   Procedure: TRACHEOSTOMY  EXCHANGE;  Surgeon: [name], MD;  Location: [facility];  Service: ENT;  Laterality: N/A;  . VENTRICULOPERITONEAL SHUNT N/A 03/18/2020   Procedure: RIGHT SHUNT INSERTION VENTRICULAR-PERITONEAL/ BURR HOLE Evacuation of Subdural Hematoma;  Surgeon: [name], MD;  Location: [facility];  Service: Neurosurgery;  Laterality: N/A;    ROS: Review of Systems Negative except as stated above  PHYSICAL EXAM: BP 127/80   Pulse 89   Resp 16   Ht 5\' 6"  (1.676 m)   Wt 131 lb 3.2 oz (59.5 kg)   SpO2 99%   BMI 21.18 kg/m  Physical Exam  General appearance -patient is an older African-American female sitting in wheelchair in no acute distress.   Mental status -she tries to answer some questions.  She accurately gives the day of the week and year however she thinks the month is January. Eyes -the right eyelid is shut closed.   Mouth -oral mucosa moist.   Neck -patient with trach cuff in place.  She intermittently has cough and rattling requiring her daughter to suction.  Mucus is white. Chest -breath sounds are harsh and rhonchorous with a lot of upper airway sounds.  No crackles or wheezes heard.   Heart - normal rate, regular rhythm, normal S1, S2, no murmurs, rubs, clicks or gallops Abdomen -PEG tube noted in place in the center of the upper abdomen.  The site is clean.  No erythema or drainage noted around the site. Neurological -orientation as stated above.  Patient is able to follow commands but sometimes they have to be repeated before she understands.  Power is 4+/5 in both upper and lower extremities. Gait not observed. Extremities -no lower extremity edema.  She has good peripheral pulses in the upper and lower extremities.  CMP Latest Ref Rng & Units 05/20/2020 05/11/2020 05/05/2020  Glucose 70 - 99 mg/dL 149(H) 103(H) 97  BUN 8 - 23 mg/dL 26(H) 26(H) 22  Creatinine 0.44 - 1.00 mg/dL 0.68 0.60 0.70  Sodium 135 - 145 mmol/L 139 139 141  Potassium 3.5 - 5.1 mmol/L 4.7 4.3 4.2  Chloride 98 -  111 mmol/L 99 103 100  CO2 22 - 32 mmol/L 31 28 34(H)  Calcium 8.9 - 10.3 mg/dL 10.1 9.9 9.9  Total Protein 6.5 - 8.1 g/dL 7.5 - -  Total Bilirubin 0.3 - 1.2 mg/dL 0.4 - -  Alkaline Phos 38 - 126 U/L 61 - -  AST 15 - 41 U/L 19 - -  ALT 0 - 44 U/L 15 - -   Lipid Panel     Component Value Date/Time   TRIG 109 02/04/2020 0732    CBC    Component Value Date/Time   WBC 5.7 05/19/2020 0557   RBC 4.18 05/19/2020 0557   HGB 11.1 (L) 05/19/2020 0557   HCT 36.7 05/19/2020 0557   PLT 136 (L) 05/19/2020 0557   MCV 87.8 05/19/2020 0557   MCH 26.6 05/19/2020 0557   MCHC 30.2 05/19/2020 0557   RDW 14.8 05/19/2020 0557   LYMPHSABS 2.2 05/19/2020 0557   MONOABS 0.5 05/19/2020 0557   EOSABS 0.1 05/19/2020 0557   BASOSABS 0.0 05/19/2020 0557   Lab Results  Component Value Date   HGBA1C 5.8 (H) 04/22/2020    ASSESSMENT AND PLAN: 1. Establishing care with new doctor, encounter for  2. History of hemorrhagic cerebrovascular accident (CVA) with residual deficit Patient has had a prolonged and difficult hospitalization but apparently has shown significant progress.  Plan is for [facility] services once she is approved for Medicare/Medicaid. -Encouraged daughter to establish daily routine for her.  Keep a calendar visible on her bedroom wall and every day remind her of the day of the week, month and year just to help with orientation.  3. Essential hypertension At goal.  Continue current medications that are amlodipine and hydralazine.  4. Tracheostomy status ([place]) Continue regular suctioning. Keep upcoming appointment with Dr. Constance Holster.  5. PEG (percutaneous endoscopic gastrostomy) status ([place]) Continue tube feeds as directed by PMR Report any difficulty with the PEG tube or drainage around the tube to [place].  6. Dysphagia,  post-stroke Currently on tube feeds I would leave it up to PMR to determine when her next swallowing trial could be done.  7. Postoperative hypothyroidism Continue  levothyroxine - TSH  8. Prediabetes We will monitor A1c intermittently  9. S/P ventriculoperitoneal shunt Keep follow-up appointment with neurosurgeon  10. Functional urinary incontinence Patient wears Depends    Patient and her daughter were  given the opportunity to ask questions.  Patient is not able to verbalize well.  However her daughter verbalized understanding of the plan and was able to repeat key elements of the plan.   Orders Placed This Encounter  Procedures  . TSH     Requested Prescriptions    No prescriptions requested or ordered in this encounter    Return in about 2 months (around 08/05/2020).  Karle Plumber, MD, FACP

## 2020-06-06 ENCOUNTER — Other Ambulatory Visit: Payer: Self-pay | Admitting: Internal Medicine

## 2020-06-06 ENCOUNTER — Encounter: Payer: Self-pay | Admitting: Internal Medicine

## 2020-06-06 ENCOUNTER — Other Ambulatory Visit: Payer: Self-pay | Admitting: Pharmacist

## 2020-06-06 DIAGNOSIS — Z982 Presence of cerebrospinal fluid drainage device: Secondary | ICD-10-CM | POA: Insufficient documentation

## 2020-06-06 DIAGNOSIS — R3981 Functional urinary incontinence: Secondary | ICD-10-CM | POA: Insufficient documentation

## 2020-06-06 DIAGNOSIS — Z931 Gastrostomy status: Secondary | ICD-10-CM

## 2020-06-06 DIAGNOSIS — Z930 Tracheostomy status: Secondary | ICD-10-CM

## 2020-06-06 DIAGNOSIS — E890 Postprocedural hypothyroidism: Secondary | ICD-10-CM | POA: Insufficient documentation

## 2020-06-06 DIAGNOSIS — I6930 Unspecified sequelae of cerebral infarction: Secondary | ICD-10-CM | POA: Insufficient documentation

## 2020-06-06 LAB — TSH: TSH: 4.39 u[IU]/mL (ref 0.450–4.500)

## 2020-06-06 MED ORDER — HYDRALAZINE HCL 50 MG PO TABS: 50.0000 mg | ORAL_TABLET | Freq: Three times a day (TID) | ORAL | 0 refills | Status: DC

## 2020-06-06 MED ORDER — LABETALOL HCL 300 MG PO TABS: 300.0000 mg | ORAL_TABLET | Freq: Three times a day (TID) | ORAL | 0 refills | Status: DC

## 2020-06-06 MED ORDER — LEVOTHYROXINE SODIUM 100 MCG PO TABS: 100.0000 ug | ORAL_TABLET | Freq: Every day | ORAL | 0 refills | Status: DC

## 2020-06-06 MED ORDER — QUETIAPINE FUMARATE 50 MG PO TABS: 50.0000 mg | ORAL_TABLET | Freq: Two times a day (BID) | ORAL | 0 refills | Status: DC

## 2020-06-06 MED ORDER — AMLODIPINE BESYLATE 10 MG PO TABS: 10.0000 mg | ORAL_TABLET | Freq: Every day | ORAL | 0 refills | Status: DC

## 2020-06-06 MED ORDER — PANTOPRAZOLE SODIUM 40 MG PO PACK: 40.0000 mg | PACK | Freq: Every day | ORAL | 0 refills | Status: DC

## 2020-06-06 MED ORDER — SCOPOLAMINE 1 MG/3DAYS TD PT72: 1.0000 | MEDICATED_PATCH | TRANSDERMAL | 12 refills | Status: DC

## 2020-06-08 ENCOUNTER — Other Ambulatory Visit: Payer: Self-pay | Admitting: Critical Care Medicine

## 2020-06-08 MED ORDER — LEVOTHYROXINE SODIUM 100 MCG PO TABS: 100.0000 ug | ORAL_TABLET | Freq: Every day | ORAL | 0 refills | Status: DC

## 2020-06-08 MED ORDER — HYDRALAZINE HCL 50 MG PO TABS: 50.0000 mg | ORAL_TABLET | Freq: Three times a day (TID) | ORAL | 0 refills | Status: DC

## 2020-06-08 MED ORDER — QUETIAPINE FUMARATE 50 MG PO TABS: 50.0000 mg | ORAL_TABLET | Freq: Two times a day (BID) | ORAL | 0 refills | Status: DC

## 2020-06-08 MED ORDER — AMLODIPINE BESYLATE 10 MG PO TABS: 10.0000 mg | ORAL_TABLET | Freq: Every day | ORAL | 0 refills | Status: DC

## 2020-06-08 MED ORDER — LABETALOL HCL 300 MG PO TABS: 300.0000 mg | ORAL_TABLET | Freq: Three times a day (TID) | ORAL | 0 refills | Status: DC

## 2020-06-10 ENCOUNTER — Other Ambulatory Visit: Payer: Self-pay | Admitting: *Deleted

## 2020-06-10 ENCOUNTER — Other Ambulatory Visit: Payer: Self-pay | Admitting: Physical Medicine & Rehabilitation

## 2020-06-10 ENCOUNTER — Telehealth: Payer: Self-pay | Admitting: *Deleted

## 2020-06-10 MED ORDER — METHYLPHENIDATE HCL 5 MG PO TABS: 2.5000 mg | ORAL_TABLET | Freq: Two times a day (BID) | ORAL | 0 refills | Status: DC

## 2020-06-10 MED ORDER — CLONAZEPAM 0.25 MG PO TBDP: 0.2500 mg | ORAL_TABLET | Freq: Two times a day (BID) | ORAL | 0 refills | Status: DC

## 2020-06-10 MED ORDER — QUETIAPINE FUMARATE 50 MG PO TABS: 50.0000 mg | ORAL_TABLET | Freq: Two times a day (BID) | ORAL | 0 refills | Status: DC

## 2020-06-10 NOTE — Telephone Encounter (Signed)
Requested medication (s) are due for refill today: no  Requested medication (s) are on the active medication list: yes  Last refill: 05/20/2020  Future visit scheduled: yes  Notes to clinic: this refill cannot be delegated Several medication filled by a different provider    Requested Prescriptions  Pending Prescriptions Disp Refills   scopolamine (TRANSDERM-SCOP) 1 MG/3DAYS 10 patch 12    Sig: Place 1 patch (1.5 mg total) onto the skin every 3 (three) days.      Off-Protocol Failed - 06/10/2020  7:36 AM      Failed - Medication not assigned to a protocol, review manually.      Passed - Valid encounter within last 12 months    Recent Outpatient Visits           5 days ago Establishing care with new doctor, encounter for   [place], MD       Future Appointments             In 1 month [name], MD [place]               methylphenidate (RITALIN) 5 MG tablet 60 tablet 0    Sig: Place 0.5 tablets (2.5 mg total) into feeding tube 2 (two) times daily with breakfast and lunch.      Not Delegated - Psychiatry:  Stimulants/ADHD Failed - 06/10/2020  7:36 AM      Failed - This refill cannot be delegated      Passed - Urine Drug Screen completed in last 360 days      Passed - Valid encounter within last 3 months    Recent Outpatient Visits           5 days ago Establishing care with new doctor, encounter for   [place], MD       Future Appointments             In 1 month [name], MD [place]               glycopyrrolate (ROBINUL) 1 MG tablet 60 tablet 0    Sig: Place 2 tablets (2 mg total) into feeding tube 2 (two) times daily.      Gastroenterology:  Antispasmodic Agents Passed - 06/10/2020  7:36 AM      Passed - Last Heart Rate in normal range    Pulse Readings from  Last 1 Encounters:  06/05/20 89          Passed - Valid encounter within last 12 months    Recent Outpatient Visits           5 days ago Establishing care with new doctor, encounter for   [place], MD       Future Appointments             In 1 month [name], MD [place]               clonazePAM (KLONOPIN) 0.25 MG disintegrating tablet 60 tablet 0    Sig: Place 1 tablet (0.25 mg total) into feeding tube 2 (two) times daily.      Not Delegated - Psychiatry:  Anxiolytics/Hypnotics Failed - 06/10/2020  7:36 AM      Failed - This refill cannot be delegated  Passed - Urine Drug Screen completed in last 360 days      Passed - Valid encounter within last 6 months    Recent Outpatient Visits           5 days ago Establishing care with new doctor, encounter for   [place], MD       Future Appointments             In 1 month Wynetta Emery Dalbert Batman, MD [place]

## 2020-06-10 NOTE — Telephone Encounter (Signed)
[facility] pharmacy called and they do not dispense controlled substances out of their pharmacy, so they have deleted the Rx for Ritalin and Clonazepam. The patient prefers [facility] on [address], so can you send the Rxs to that pharmacy. I have made the [facility] the selected pharmacy for this.

## 2020-06-10 NOTE — Telephone Encounter (Signed)
Seroquel, Ritalin, Klonopin refilled.  Patient to follow-up with ENT regarding Robinul.  Thanks.

## 2020-06-10 NOTE — Telephone Encounter (Signed)
Copied from [place] [phone]. Topic: General - Inquiry >> Jun 09, 2020  4:40 PM [name] D wrote: Reason for CRM: Pt's daughter Marquis Buggy called asking for refills on.  She said she could not request them on mychart.   Glycopyrrolate 1 mg Methylphenidate 5 mg Clonazepam 0.25 mg Scopolamine patches  [place]  CB#  [phone]

## 2020-06-11 MED ORDER — METHYLPHENIDATE HCL 5 MG PO TABS: 2.5000 mg | ORAL_TABLET | Freq: Two times a day (BID) | ORAL | 0 refills | Status: DC

## 2020-06-11 MED ORDER — CLONAZEPAM 0.25 MG PO TBDP: 0.2500 mg | ORAL_TABLET | Freq: Two times a day (BID) | ORAL | 0 refills | Status: DC

## 2020-06-11 NOTE — Telephone Encounter (Signed)
Resent. Thank you.

## 2020-06-12 ENCOUNTER — Telehealth: Payer: Self-pay | Admitting: *Deleted

## 2020-06-12 NOTE — Telephone Encounter (Signed)
[name], Mrs Magner's daughter called about the FMLA.  She reports that a Marland Kitchen from [employer] has been trying to reach Dr Posey Pronto.  Do you know anything about these papers?  See [place]'s call on 05/26/20.

## 2020-06-16 ENCOUNTER — Other Ambulatory Visit: Payer: Self-pay | Admitting: Pharmacy Technician

## 2020-06-16 MED FILL — LEVOTHYROXINE SODIUM 100 MC: 100 | 30 days supply | Qty: 30 | Fill #0

## 2020-06-16 MED FILL — LABETALOL HCL 300 MG TABLET: 300 | 30 days supply | Qty: 90 | Fill #0

## 2020-06-16 MED FILL — AMLODIPINE BESYLATE 10 MG T: 10 | 30 days supply | Qty: 30 | Fill #0

## 2020-06-16 MED FILL — hydrALAZINE HCL 50 MG TABS: 50 | 30 days supply | Qty: 90 | Fill #0

## 2020-06-16 MED FILL — QUETIAPINE FUMARATE 50 MG T: 50 | 30 days supply | Qty: 60 | Fill #0

## 2020-06-26 ENCOUNTER — Telehealth: Payer: Self-pay

## 2020-06-26 NOTE — Telephone Encounter (Signed)
Four phone calls from [facility] group stating we need to correct part a4 and party c 1 need date time intial --- I called her number and left another voicemail -- I do not have paperwork - if has not uploaded from scan center - if she would like to fax it back to [place] - we will try .

## 2020-06-30 ENCOUNTER — Other Ambulatory Visit: Payer: Self-pay

## 2020-06-30 ENCOUNTER — Encounter: Payer: Medicaid Other | Admitting: Physical Medicine & Rehabilitation

## 2020-06-30 MED FILL — Scopolamine TD Patch 72HR 1 MG/3DAYS: TRANSDERMAL | Qty: 10 | Fill #0 | Status: CN

## 2020-06-30 MED FILL — Scopolamine TD Patch 72HR 1 MG/3DAYS: TRANSDERMAL | 30 days supply | Qty: 10 | Fill #0 | Status: AC

## 2020-07-01 ENCOUNTER — Other Ambulatory Visit: Payer: Self-pay

## 2020-07-01 ENCOUNTER — Telehealth: Payer: Self-pay | Admitting: Internal Medicine

## 2020-07-01 DIAGNOSIS — Z931 Gastrostomy status: Secondary | ICD-10-CM

## 2020-07-01 NOTE — Telephone Encounter (Signed)
Contacted [facility] to get order and to schedule pt an appt. Provider spoke with a nurse and got order and I took over and was transferred to the scheduling was unable to speak to someone had to lvm

## 2020-07-02 ENCOUNTER — Other Ambulatory Visit (HOSPITAL_COMMUNITY): Payer: Self-pay | Admitting: Internal Medicine

## 2020-07-02 ENCOUNTER — Other Ambulatory Visit: Payer: Self-pay

## 2020-07-02 DIAGNOSIS — K9423 Gastrostomy malfunction: Secondary | ICD-10-CM

## 2020-07-02 NOTE — Telephone Encounter (Signed)
Contacted Interventional Radiology and spoke to [place] per [place] have pt come tomorrow at 11am first floor radiology. Tried contacting pt daughter to go appt information vm is full. Will send a MyChart message

## 2020-07-03 ENCOUNTER — Other Ambulatory Visit: Payer: Self-pay

## 2020-07-03 ENCOUNTER — Ambulatory Visit (HOSPITAL_COMMUNITY)
Admission: RE | Admit: 2020-07-03 | Discharge: 2020-07-03 | Disposition: A | Discharge status: Home / Self Care | Payer: Medicaid Other | Source: Ambulatory Visit | Attending: Internal Medicine | Admitting: Internal Medicine

## 2020-07-03 DIAGNOSIS — K9423 Gastrostomy malfunction: Secondary | ICD-10-CM | POA: Diagnosis not present

## 2020-07-03 HISTORY — PX: IR REPLC GASTRO/COLONIC TUBE PERCUT W/FLUORO: IMG2333

## 2020-07-03 HISTORY — PX: IR CM INJ ANY COLONIC TUBE W/FLUORO: IMG2336

## 2020-07-03 IMAGING — XA IR REPLACE G-TUBE/COLONIC TUBE
1 series · 14 of 14 positions shown · non-contrast
Comparison: Image guided gastrostomy tube placement-[DATE]

INDICATION: Concern for gastrostomy tube dislodgement. Please perform
gastrostomy tube injection and exchange as indicated.

EXAM:
FLUOROSCOPIC GUIDED REPLACEMENT OF GASTROSTOMY TUBE

[Series 1: fl (-) angio · 11 acquisitions, 14 frames shown]
[im 1/11]
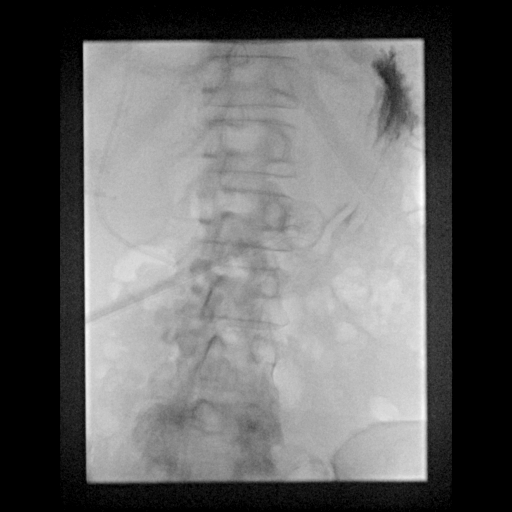
[im 1/11]
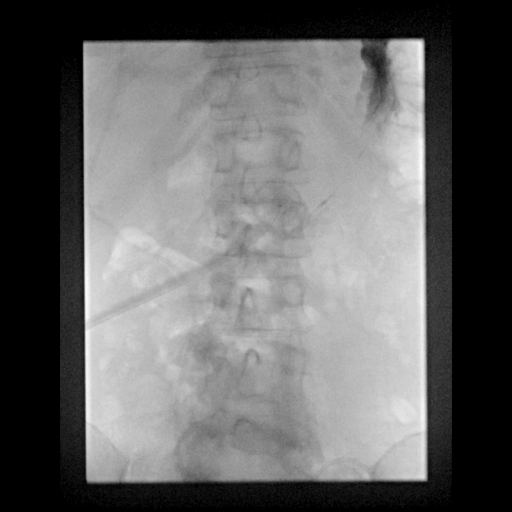
[im 1/11]
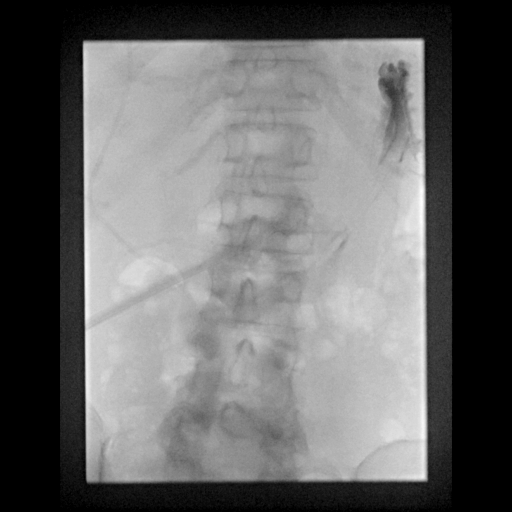
[im 1/11]
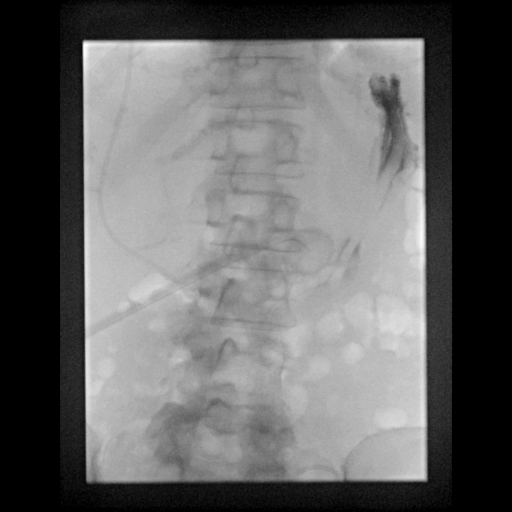
[im 2/11]
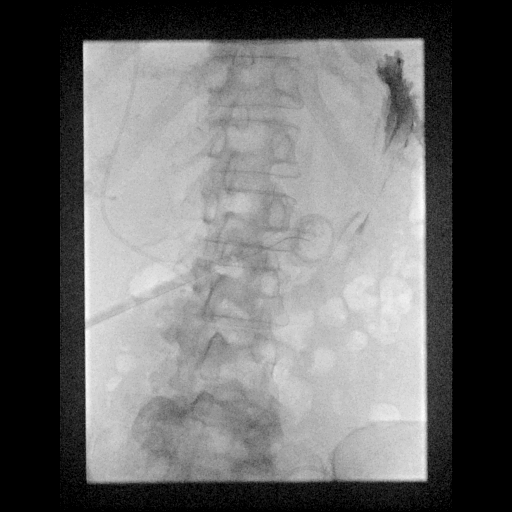
[im 3/11]
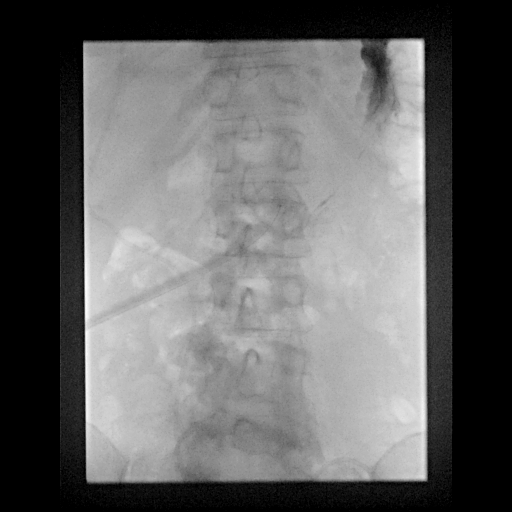
[im 4/11]
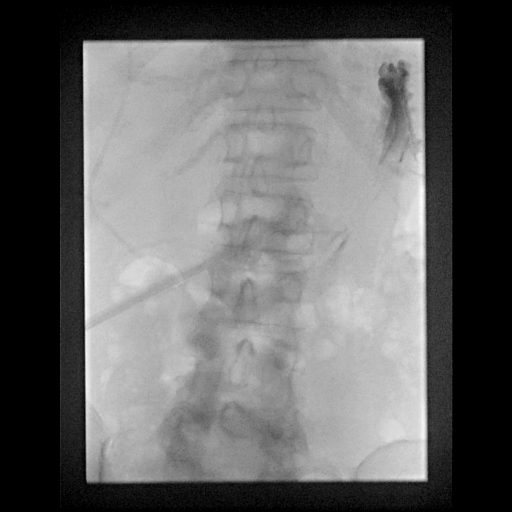
[im 5/11]
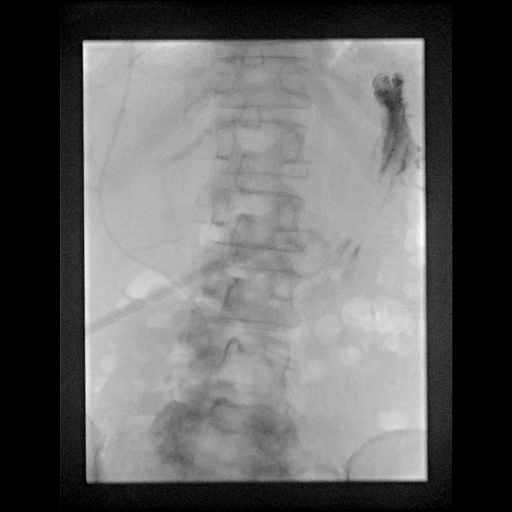
[im 6/11]
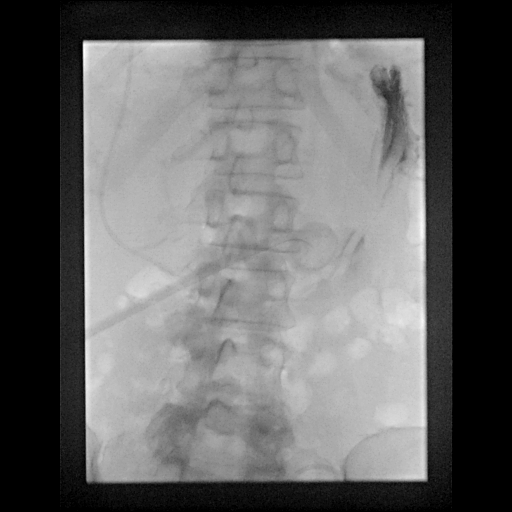
[im 7/11]
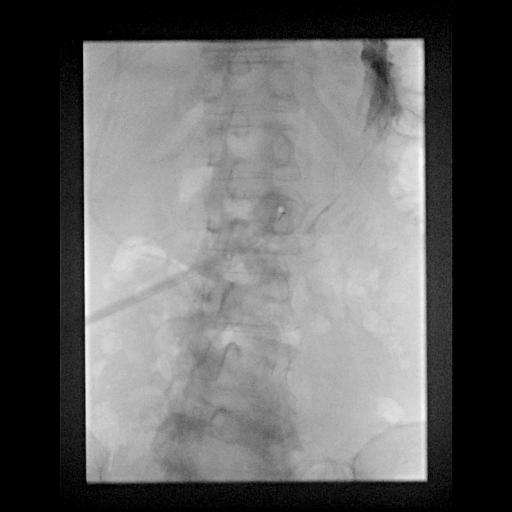
[im 8/11]
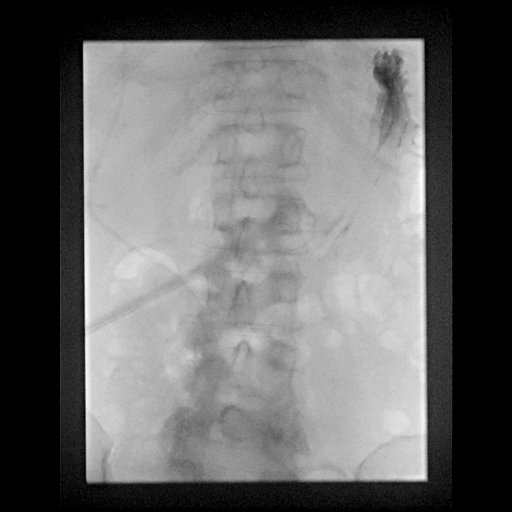
[im 9/11]
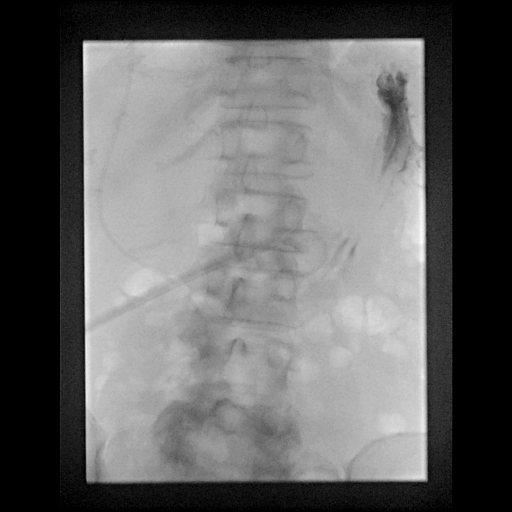
[im 10/11]
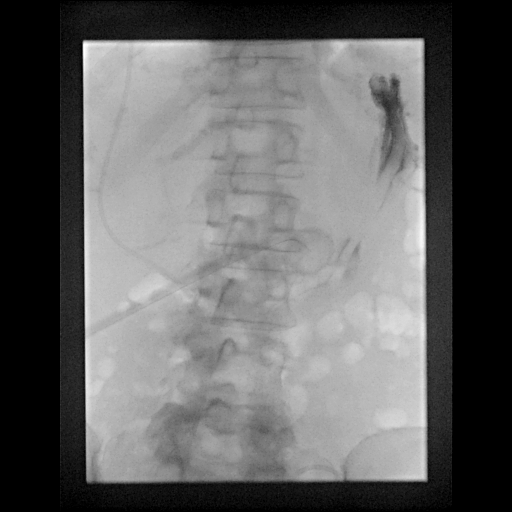
[im 11/11]
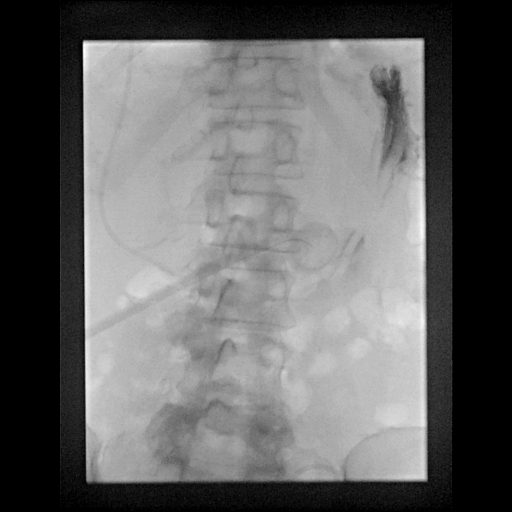

[14 of 14 positions shown; findings below may reference images not displayed]

MEDICATIONS:
None.

CONTRAST:  25mL OMNIPAQUE IOHEXOL 300 MG/ML SOLN - administered into
the gastric lumen

FLUOROSCOPY TIME:  18 seconds (1 mGy)

COMPLICATIONS:
None immediate.

PROCEDURE:
The existing gastrostomy tube was injected with a small amount of
contrast confirming appropriate positioning within the gastric lumen
without evidence of malpositioning or enteric contrast leakage.

The gastrostomy tube was flushed with a small amount of saline and a
dressing was applied.

The patient tolerated the procedure well without immediate
postprocedural complication.
IMPRESSION: Appropriately positioned and functioning pull-through gastrostomy
tube. No exchange performed. The gastrostomy tube is ready for
immediate usage.

## 2020-07-03 MED ORDER — IOHEXOL 300 MG/ML  SOLN
25.0000 mL | Freq: Once | INTRAMUSCULAR | Status: AC | PRN
  Administered 2020-07-03: 25 mL

## 2020-07-03 NOTE — Procedures (Signed)
Pre procedural Dx: Concern for malpositioning of pull though G-tube. Post procedural Dx: Same  Appropriately positioned and functioning pull-through gastrostomy tube.  No exchange performed.   The gastrostomy tube is ready for immediate usage.   Ronny Bacon, MD Pager #: [phone]

## 2020-07-03 NOTE — Addendum Note (Signed)
Addended by: Karle Plumber B on: 07/03/2020 04:20 PM   Modules accepted: Orders

## 2020-07-07 ENCOUNTER — Encounter (HOSPITAL_COMMUNITY): Payer: Self-pay | Admitting: Radiology

## 2020-07-07 ENCOUNTER — Other Ambulatory Visit (HOSPITAL_COMMUNITY): Payer: Self-pay | Admitting: Internal Medicine

## 2020-07-07 ENCOUNTER — Other Ambulatory Visit: Payer: Self-pay

## 2020-07-07 DIAGNOSIS — K9423 Gastrostomy malfunction: Secondary | ICD-10-CM

## 2020-07-07 MED FILL — Quetiapine Fumarate Tab 50 MG: ORAL | 30 days supply | Qty: 60 | Fill #0 | Status: AC

## 2020-07-09 ENCOUNTER — Encounter: Payer: Self-pay | Admitting: Internal Medicine

## 2020-07-09 MED ORDER — AZITHROMYCIN 250 MG PO TABS: ORAL_TABLET | ORAL | 0 refills | Status: DC

## 2020-07-09 MED ORDER — AZITHROMYCIN 250 MG PO TABS
ORAL_TABLET | ORAL | 0 refills | Status: DC
  Filled 2020-07-09: qty 6, 5d supply, fill #0

## 2020-07-10 ENCOUNTER — Other Ambulatory Visit: Payer: Self-pay

## 2020-07-11 ENCOUNTER — Telehealth: Payer: Self-pay | Admitting: Internal Medicine

## 2020-07-11 NOTE — Telephone Encounter (Signed)
-----   Message from Ena Dawley sent at 07/10/2020  5:01 PM EDT ----- Regarding: RE: ENT Dr. Constance Holster I contacted  [facility]  they are saying that another place refer her   3/3 patient came late  and 3/11 no showed  . They schedule her again  for  07-31-20 @ 1:30pm  with Dr Constance Holster that is the sooner  and she  is in a cancellation list. I will contact the patient's daughter.    ----- Message ----- From: [name], MD Sent: 07/10/2020   1:35 PM EDT To: Ena Dawley Subject: ENT Dr. Constance Holster                                  Pt's daughter has not received appt with Dr. Constance Holster as yet. Please look into it.  Pt has a tracheostomy placed by him.

## 2020-07-16 ENCOUNTER — Other Ambulatory Visit: Payer: Self-pay | Admitting: Internal Medicine

## 2020-07-16 ENCOUNTER — Other Ambulatory Visit: Payer: Self-pay

## 2020-07-16 ENCOUNTER — Other Ambulatory Visit: Payer: Self-pay | Admitting: Physical Medicine & Rehabilitation

## 2020-07-16 MED ORDER — LEVOTHYROXINE SODIUM 100 MCG PO TABS
100.0000 ug | ORAL_TABLET | Freq: Every day | ORAL | 3 refills | Status: DC
  Filled 2020-07-16: qty 30, 30d supply, fill #0
  Filled 2020-08-08: qty 30, 30d supply, fill #1

## 2020-07-16 MED ORDER — PANTOPRAZOLE SODIUM 40 MG PO PACK
40.0000 mg | PACK | Freq: Every day | ORAL | 1 refills | Status: DC
  Filled 2020-07-16 – 2020-07-22 (×4): qty 600, 30d supply, fill #0

## 2020-07-17 ENCOUNTER — Other Ambulatory Visit: Payer: Self-pay | Admitting: Physical Medicine & Rehabilitation

## 2020-07-17 ENCOUNTER — Other Ambulatory Visit: Payer: Self-pay

## 2020-07-17 MED ORDER — QUETIAPINE FUMARATE 50 MG PO TABS
ORAL_TABLET | ORAL | 0 refills | Status: DC
  Filled 2020-07-17: qty 60, fill #0
  Filled 2020-07-18: qty 60, 30d supply, fill #0

## 2020-07-17 MED ORDER — CLONAZEPAM 0.25 MG PO TBDP
0.2500 mg | ORAL_TABLET | Freq: Two times a day (BID) | ORAL | 0 refills | Status: DC
  Filled 2020-07-17 – 2020-07-28 (×5): qty 60, 30d supply, fill #0

## 2020-07-18 ENCOUNTER — Other Ambulatory Visit: Payer: Self-pay | Admitting: Physician Assistant

## 2020-07-18 ENCOUNTER — Other Ambulatory Visit: Payer: Self-pay

## 2020-07-18 MED FILL — Amlodipine Besylate Tab 10 MG (Base Equivalent): ORAL | 30 days supply | Qty: 30 | Fill #0 | Status: AC

## 2020-07-21 ENCOUNTER — Other Ambulatory Visit: Payer: Self-pay

## 2020-07-21 ENCOUNTER — Other Ambulatory Visit (HOSPITAL_COMMUNITY)
Admission: RE | Admit: 2020-07-21 | Discharge: 2020-07-21 | Disposition: A | Discharge status: Home / Self Care | Payer: Medicaid Other | Source: Ambulatory Visit | Attending: Internal Medicine | Admitting: Internal Medicine

## 2020-07-21 ENCOUNTER — Other Ambulatory Visit: Payer: Self-pay | Admitting: Physical Medicine & Rehabilitation

## 2020-07-21 DIAGNOSIS — Z20822 Contact with and (suspected) exposure to covid-19: Secondary | ICD-10-CM | POA: Diagnosis not present

## 2020-07-21 DIAGNOSIS — Z01812 Encounter for preprocedural laboratory examination: Secondary | ICD-10-CM | POA: Diagnosis present

## 2020-07-22 ENCOUNTER — Other Ambulatory Visit: Payer: Self-pay | Admitting: Physician Assistant

## 2020-07-22 ENCOUNTER — Other Ambulatory Visit: Payer: Self-pay | Admitting: Internal Medicine

## 2020-07-22 ENCOUNTER — Other Ambulatory Visit: Payer: Self-pay

## 2020-07-22 LAB — SARS CORONAVIRUS 2 (TAT 6-24 HRS): SARS Coronavirus 2: NEGATIVE

## 2020-07-22 NOTE — Telephone Encounter (Signed)
   Notes to clinic:       Pharmacy comment: This medication is too expensive to order, we can only fill the tablets, please resend for something else or tablets     Requested Prescriptions  Pending Prescriptions Disp Refills   pantoprazole sodium (PROTONIX) 40 mg/20 mL PACK 600 mL 1    Sig: Place 20 mLs (40 mg total) into feeding tube daily.      Gastroenterology: Proton Pump Inhibitors Passed - 07/22/2020  1:24 PM      Passed - Valid encounter within last 12 months    Recent Outpatient Visits           1 month ago Establishing care with new doctor, encounter for   [place], MD       Future Appointments             In 2 weeks [name], MD [place]

## 2020-07-23 ENCOUNTER — Other Ambulatory Visit: Payer: Self-pay

## 2020-07-24 ENCOUNTER — Other Ambulatory Visit: Payer: Self-pay

## 2020-07-24 ENCOUNTER — Other Ambulatory Visit: Payer: Self-pay | Admitting: Acute Care

## 2020-07-24 ENCOUNTER — Ambulatory Visit (HOSPITAL_COMMUNITY)
Admission: RE | Admit: 2020-07-24 | Discharge: 2020-07-24 | Disposition: A | Discharge status: Home / Self Care | Payer: Self-pay | Source: Ambulatory Visit | Attending: Acute Care | Admitting: Acute Care

## 2020-07-24 DIAGNOSIS — Z930 Tracheostomy status: Secondary | ICD-10-CM

## 2020-07-24 MED ORDER — METOCLOPRAMIDE HCL 5 MG PO TABS: 5.0000 mg | ORAL_TABLET | Freq: Three times a day (TID) | ORAL | 12 refills | Status: DC

## 2020-07-24 MED ORDER — PANTOPRAZOLE SODIUM 40 MG PO PACK: 40.0000 mg | PACK | Freq: Every day | ORAL | 12 refills | Status: DC

## 2020-07-24 NOTE — Progress Notes (Signed)
Reason for visit  Trach follow up   HPI   Well known to me. 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 She has remained trach dependent due to poor airway clearance and severe oral secretions. Last time I saw her was on 3/3 at which time she looked remarkably better and stronger. She was living with her daughter [name] who provides all of her care. During that visit I suggested that they start using PMV during daytime hours in effort to "retrain" her to manage more normal cough mechanics and breathing mechanics.. She returns today for planned follow up. She was supposed to have her trach replaced but it became dislodged last evening 4/27 and Sascha was able to put a new trach in it's place w/out difficulty (she had watched [place] do it last time).  Comparing her to last visit she looks well. She seems stronger. Also notably less impulsive than last visit. Her only real complaint is on-going significant oral secretions. Intermittent episodes of vomiting. From a trach specific stand-point her need for sxn w/ PMV has been significantly decreased.   Review of Systems  Constitutional: Negative for fever, malaise/fatigue and weight loss.  HENT: Positive for congestion, sinus pain and sore throat.   Eyes: Negative.   Respiratory: Positive for cough, sputum production and shortness of breath.   Cardiovascular: Negative.   Gastrointestinal: Positive for vomiting.  Genitourinary: Negative.   Musculoskeletal: Negative.   Skin: Negative.   Neurological: Negative.   Endo/Heme/Allergies: Negative.   Psychiatric/Behavioral: Negative.    exam   General this is a 65 year old female. She is sitting up in chair and in no distress HENT NCAT trach site unremarkable. Stoma site clean. Still w/ poor phonation efforts and sig oral secretions. Pulm room air  Some occ rhonchi w/ cough Card rrr abd soft not  tender + bowel sounds Ext warm and dry  Neuro awake. Less impulsive.   Procedures   None  Impression/plan  Trach dependence s/p SAH Ineffective cough mechanics and poor oral airway clearance  GERD Sialorrhea (has not really responded to glycopyrrolate or scopolamine)  Discussion Doing about same from trach stand-point I wonder if her excessive drooling is more reflux related. She did better per her daughter when she was on ppi  Plan Resume PPI. Of oral secretions not better after 1 week will trial low dose reglan Cont scopolamine patch Has f/u w/ ENT. Happy to help here and open to any suggestions they might have. Will plan on reviewing clinic notes when I see her next For now I still do not think she is candidate for decannulation however it's not unreasonable to keep it off the table for the future.  For now I will plan on seeing her in 12 weeks for planned trach change   My time 34 minutes more than 2/3 of this was direct face to face time w/ patient and her daughter.   Erick Colace ACNP-BC [place] Pager # [phone] OR # [phone] if no answer

## 2020-07-24 NOTE — Progress Notes (Signed)
Tracheostomy Procedure Note  Stacy Moran [id number] 03/15/1956  Pre /POstProcedure Tracheostomy Information : Trach Assessment and Evaluation Only Daughter changed patient trach at home the night before because trach came out during bath time.  Site and trach assessed by NP during this visit. Size 6.0 uncuffed Shiley Flex.   Education: none  Prescription needs: Done with NP    Additional needs: none

## 2020-07-25 ENCOUNTER — Other Ambulatory Visit: Payer: Self-pay | Admitting: Acute Care

## 2020-07-25 ENCOUNTER — Telehealth: Payer: Self-pay | Admitting: Acute Care

## 2020-07-28 ENCOUNTER — Other Ambulatory Visit: Payer: Self-pay

## 2020-07-29 ENCOUNTER — Encounter: Payer: Medicaid Other | Attending: Registered Nurse | Admitting: Physical Medicine & Rehabilitation

## 2020-07-29 ENCOUNTER — Other Ambulatory Visit: Payer: Self-pay

## 2020-07-29 ENCOUNTER — Encounter: Payer: Self-pay | Admitting: Physical Medicine & Rehabilitation

## 2020-07-29 VITALS — BP 111/77 | HR 79 | Temp 98.3°F | Ht 66.0 in

## 2020-07-29 DIAGNOSIS — Z931 Gastrostomy status: Secondary | ICD-10-CM | POA: Diagnosis present

## 2020-07-29 DIAGNOSIS — I69391 Dysphagia following cerebral infarction: Secondary | ICD-10-CM | POA: Insufficient documentation

## 2020-07-29 DIAGNOSIS — Z930 Tracheostomy status: Secondary | ICD-10-CM | POA: Diagnosis present

## 2020-07-29 DIAGNOSIS — I729 Aneurysm of unspecified site: Secondary | ICD-10-CM | POA: Insufficient documentation

## 2020-07-29 DIAGNOSIS — I10 Essential (primary) hypertension: Secondary | ICD-10-CM | POA: Insufficient documentation

## 2020-07-29 DIAGNOSIS — I609 Nontraumatic subarachnoid hemorrhage, unspecified: Secondary | ICD-10-CM | POA: Insufficient documentation

## 2020-07-29 MED ORDER — QUETIAPINE FUMARATE 50 MG PO TABS
ORAL_TABLET | ORAL | 3 refills | Status: DC
  Filled 2020-07-29: qty 60, fill #0
  Filled 2020-08-10: qty 60, 30d supply, fill #0

## 2020-07-29 NOTE — Progress Notes (Signed)
Subjective:    Patient ID: Stacy Moran, female    DOB: 03-19-1956, 65 y.o.   MRN: [id number]  HPI  Right-handed female with unremarkable past medical history on no prescription medications presents for hospital follow up for left SAH/right frontal ventriculostomy 01/30/2020 with right SDH followed by laparoscopic assisted insertion of ventriculoperitoneal shunt as well 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 and bur hole evacuation of chronic subdural hematoma 03/18/2020.  Daughter provides history. Last clinic visit 05/26/20.  Since that time, discussed issues with PEG and bladder.  Patient presented to the office, but sent to urgent care.  Since that time, she is doing exercises.  She saw ENT and started on PMV. Balance improving. She is using Seroquel. Ritalin kept her up and was d/ced. She is remains NPO.  Pain Inventory Average Pain 3 Pain Right Now 0 My pain is intermittent and dull  LOCATION OF PAIN  head  BOWEL Number of stools per week: 1 every two weeks Oral laxative use No  Type of laxative n/a  Enema or suppository use No  History of colostomy No  Incontinent No   BLADDER Pads In and out cath, frequency no cath Able to self cath No  Bladder incontinence Yes  Frequent urination No  Leakage with coughing No  Difficulty starting stream No  Incomplete bladder emptying No    Mobility walk with assistance use a walker how many minutes can you walk? 10 ability to climb steps?  yes do you drive?  no use a wheelchair Do you have any goals in this area?  yes  Function not employed: date last employed n/a I need assistance with the following:  dressing, bathing, toileting and household duties  Neuro/Psych weakness trouble walking confusion anxiety  Prior Studies no  Physicians involved in your care n/a   No family history on file. Social History   Socioeconomic History  . Marital status: Unknown    Spouse name: Not on file   . Number of children: Not on file  . Years of education: Not on file  . Highest education level: Not on file  Occupational History  . Not on file  Tobacco Use  . Smoking status: Former Research scientist (life sciences)  . Smokeless tobacco: Never Used  Vaping Use  . Vaping Use: Never used  Substance and Sexual Activity  . Alcohol use: Not Currently  . Drug use: Not Currently  . Sexual activity: Not on file  Other Topics Concern  . Not on file  Social History Narrative  . Not on file   Social Determinants of Health   Financial Resource Strain: Not on file  Food Insecurity: Not on file  Transportation Needs: Not on file  Physical Activity: Not on file  Stress: Not on file  Social Connections: Not on file   Past Surgical History:  Procedure Laterality Date  . BURR HOLE N/A 03/18/2020   Procedure: [place] Flirt;  Surgeon: [name], MD;  Location: [place];  Service: Neurosurgery;  Laterality: N/A;  . CRANIOTOMY Left 01/30/2020   Procedure: LEFT FAR LATERAL CRANIOTOMY FOR ANEURYSM CLIPPING;  Surgeon: [name], MD;  Location: [place];  Service: Neurosurgery;  Laterality: Left;  . DIRECT LARYNGOSCOPY N/A 02/29/2020   Procedure: DIRECT LARYNGOSCOPY;  Surgeon: [name], MD;  Location: [place];  Service: ENT;  Laterality: N/A;  . IR ANGIO INTRA EXTRACRAN SEL INTERNAL CAROTID BILAT MOD SED  01/30/2020  . IR ANGIO VERTEBRAL SEL VERTEBRAL UNI L MOD SED  01/30/2020  .  IR CM INJ ANY COLONIC TUBE W/FLUORO  07/03/2020  . IR GASTROSTOMY TUBE MOD SED  02/22/2020  . IR [place] TUBE PERCUT W/FLUORO  07/03/2020  . LAPAROSCOPIC REVISION VENTRICULAR-PERITONEAL (V-P) SHUNT N/A 03/18/2020   Procedure: LAPAROSCOPIC REVISION VENTRICULAR-PERITONEAL (V-P) SHUNT;  Surgeon: [name], MD;  Location: [place];  Service: General;  Laterality: N/A;  . RADIOLOGY WITH ANESTHESIA N/A 01/30/2020   Procedure: IR WITH ANESTHESIA;  Surgeon: [name], MD;  Location: [place];  Service: Radiology;  Laterality:  N/A;  . THYROIDECTOMY N/A 02/08/2020   Procedure: THYROIDECTOMY;  Surgeon: [name], MD;  Location: [place];  Service: ENT;  Laterality: N/A;  . TRACHEOSTOMY TUBE PLACEMENT N/A 02/08/2020   Procedure: TRACHEOSTOMY;  Surgeon: [name], MD;  Location: [place];  Service: ENT;  Laterality: N/A;  . TRACHEOSTOMY TUBE PLACEMENT N/A 02/29/2020   Procedure: TRACHEOSTOMY EXCHANGE;  Surgeon: [name], MD;  Location: [place];  Service: ENT;  Laterality: N/A;  . VENTRICULOPERITONEAL SHUNT N/A 03/18/2020   Procedure: RIGHT SHUNT INSERTION VENTRICULAR-PERITONEAL/ BURR HOLE Evacuation of Subdural Hematoma;  Surgeon: [name], MD;  Location: [place];  Service: Neurosurgery;  Laterality: N/A;   Past Medical History:  Diagnosis Date  . Prosthetic eye globe    BP 111/77   Pulse 79   Temp 98.3 F (36.8 C)   Ht 5\' 6"  (1.676 m)   SpO2 94%   BMI 21.18 kg/m   Opioid Risk Score:   Fall Risk Score:  `1  Depression screen PHQ 2/9  Depression screen [facility] 2/9 06/05/2020 05/26/2020  Decreased Interest 0 2  Down, Depressed, Hopeless 0 1  PHQ - 2 Score 0 3  Altered sleeping 1 2  Tired, decreased energy 1 2  Change in appetite 0 0  Feeling bad or failure about yourself  0 0  Trouble concentrating 3 3  Moving slowly or fidgety/restless 3 0  Suicidal thoughts 0 0  PHQ-9 Score 8 10    Review of Systems  Unable to perform ROS: Acuity of condition  Constitutional: Negative.   HENT: Negative.   Eyes: Negative.   Respiratory: Positive for cough and shortness of breath.   Cardiovascular: Negative.   Gastrointestinal: Positive for vomiting.  Endocrine: Negative.   Genitourinary: Negative.   Skin: Negative.   Allergic/Immunologic: Negative.   Neurological: Positive for headaches.  Hematological: Negative.   Psychiatric/Behavioral: Negative.        Objective:   Physical Exam  Constitutional: No distress . Vital signs reviewed. HENT: Normocephalic.  Atraumatic. Neck: Trach with  PMV. Eyes: EOMI. No discharge. Cardiovascular: No JVD.   Respiratory: Normal effort.  No stridor.   GI: Non-distended.  +PEG. Skin: Warm and dry.  Intact. Psych: Normal mood.  Normal behavior. Musc: No edema in extremities.  No tenderness in extremities. Neuro: Alert Motor: 4+-5/5 throughout, stable    Assessment & Plan:  Right-handed female with unremarkable past medical history on no prescription medications presents for hospital follow up for left SAH/right frontal ventriculostomy 01/30/2020 with right SDH followed by laparoscopic assisted insertion of ventriculoperitoneal shunt as well 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 and bur hole evacuation of chronic subdural hematoma 03/18/2020.  1. 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  Encouraged HEP  Cont to follow  up with Neurosurg  Cont to follow up with ENT  2. Agitation/Restless:   Continue Seroquel 50 mg twice daily, daughter does not want to increase medication for the time being  D/c Klonopin  3. Attention/concentration deficits:           D/c Ritalin 2.5 twice daily   4. 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 Constance Holster.              Cont to follow up with ENT- plan for capping soon hopefully              5.  Post stroke dysphagia:   Gastrostomy tube 02/22/2020 per interventional radiology Dr. Earleen Newport.       Remains NPO  6. Sleep disturbance  Encourage adjustment of sleep/wake cycle

## 2020-07-30 ENCOUNTER — Emergency Department (HOSPITAL_COMMUNITY): Payer: Medicaid Other

## 2020-07-30 ENCOUNTER — Encounter (HOSPITAL_COMMUNITY): Payer: Self-pay

## 2020-07-30 ENCOUNTER — Other Ambulatory Visit: Payer: Self-pay

## 2020-07-30 ENCOUNTER — Observation Stay (HOSPITAL_COMMUNITY)
Admission: EM | Admit: 2020-07-30 | Discharge: 2020-07-31 | DRG: 065 | Disposition: A | Discharge status: Home / Self Care | Payer: Medicaid Other | Attending: Neurosurgery | Admitting: Neurosurgery

## 2020-07-30 DIAGNOSIS — Z87891 Personal history of nicotine dependence: Secondary | ICD-10-CM

## 2020-07-30 DIAGNOSIS — I6203 Nontraumatic chronic subdural hemorrhage: Secondary | ICD-10-CM | POA: Diagnosis present

## 2020-07-30 DIAGNOSIS — Z4541 Encounter for adjustment and management of cerebrospinal fluid drainage device: Secondary | ICD-10-CM

## 2020-07-30 DIAGNOSIS — Z931 Gastrostomy status: Secondary | ICD-10-CM | POA: Diagnosis not present

## 2020-07-30 DIAGNOSIS — G911 Obstructive hydrocephalus: Secondary | ICD-10-CM | POA: Diagnosis not present

## 2020-07-30 DIAGNOSIS — Z982 Presence of cerebrospinal fluid drainage device: Secondary | ICD-10-CM | POA: Diagnosis not present

## 2020-07-30 DIAGNOSIS — R569 Unspecified convulsions: Secondary | ICD-10-CM | POA: Diagnosis not present

## 2020-07-30 DIAGNOSIS — R4182 Altered mental status, unspecified: Secondary | ICD-10-CM | POA: Diagnosis not present

## 2020-07-30 DIAGNOSIS — Z930 Tracheostomy status: Secondary | ICD-10-CM | POA: Diagnosis not present

## 2020-07-30 DIAGNOSIS — Z79899 Other long term (current) drug therapy: Secondary | ICD-10-CM | POA: Diagnosis not present

## 2020-07-30 DIAGNOSIS — I671 Cerebral aneurysm, nonruptured: Secondary | ICD-10-CM | POA: Diagnosis not present

## 2020-07-30 DIAGNOSIS — R270 Ataxia, unspecified: Secondary | ICD-10-CM | POA: Diagnosis present

## 2020-07-30 DIAGNOSIS — E890 Postprocedural hypothyroidism: Secondary | ICD-10-CM | POA: Diagnosis present

## 2020-07-30 DIAGNOSIS — S065X9A Traumatic subdural hemorrhage with loss of consciousness of unspecified duration, initial encounter: Secondary | ICD-10-CM | POA: Diagnosis present

## 2020-07-30 DIAGNOSIS — I6202 Nontraumatic subacute subdural hemorrhage: Principal | ICD-10-CM | POA: Diagnosis present

## 2020-07-30 DIAGNOSIS — S065XAA Traumatic subdural hemorrhage with loss of consciousness status unknown, initial encounter: Secondary | ICD-10-CM | POA: Diagnosis present

## 2020-07-30 DIAGNOSIS — Z20822 Contact with and (suspected) exposure to covid-19: Secondary | ICD-10-CM | POA: Diagnosis not present

## 2020-07-30 DIAGNOSIS — Z298 Encounter for other specified prophylactic measures: Secondary | ICD-10-CM

## 2020-07-30 LAB — BASIC METABOLIC PANEL
Anion gap: 9 (ref 5–15)
BUN: 28 mg/dL — ABNORMAL HIGH (ref 8–23)
CO2: 28 mmol/L (ref 22–32)
Calcium: 9.6 mg/dL (ref 8.9–10.3)
Chloride: 107 mmol/L (ref 98–111)
Creatinine, Ser: 0.68 mg/dL (ref 0.44–1.00)
GFR, Estimated: >60 mL/min (ref >60)
Glucose, Bld: 114 mg/dL — ABNORMAL HIGH (ref 70–99)
Potassium: 4 mmol/L (ref 3.5–5.1)
Sodium: 144 mmol/L (ref 135–145)

## 2020-07-30 LAB — CBC WITH DIFFERENTIAL/PLATELET
Abs Immature Granulocytes: 0.02 10*3/uL (ref 0.00–0.07)
Basophils Absolute: 0 10*3/uL (ref 0.0–0.1)
Basophils Relative: 0 %
Eosinophils Absolute: 0 10*3/uL (ref 0.0–0.5)
Eosinophils Relative: 1 %
HCT: 38.4 % (ref 36.0–46.0)
Hemoglobin: 11.7 g/dL — ABNORMAL LOW (ref 12.0–15.0)
Immature Granulocytes: 0 %
Lymphocytes Relative: 14 %
Lymphs Abs: 1.2 10*3/uL (ref 0.7–4.0)
MCH: 25.8 pg — ABNORMAL LOW (ref 26.0–34.0)
MCHC: 30.5 g/dL (ref 30.0–36.0)
MCV: 84.6 fL (ref 80.0–100.0)
Monocytes Absolute: 0.3 10*3/uL (ref 0.1–1.0)
Monocytes Relative: 4 %
Neutro Abs: 6.6 10*3/uL (ref 1.7–7.7)
Neutrophils Relative %: 81 %
Platelets: 138 10*3/uL — ABNORMAL LOW (ref 150–400)
RBC: 4.54 MIL/uL (ref 3.87–5.11)
RDW: 14.2 % (ref 11.5–15.5)
WBC: 8.1 10*3/uL (ref 4.0–10.5)
nRBC: 0 % (ref 0.0–0.2)

## 2020-07-30 LAB — HEPATIC FUNCTION PANEL
ALT: 29 U/L (ref 0–44)
AST: 26 U/L (ref 15–41)
Albumin: 4.2 g/dL (ref 3.5–5.0)
Alkaline Phosphatase: 64 U/L (ref 38–126)
Bilirubin, Direct: <0.1 mg/dL (ref 0.0–0.2)
Total Bilirubin: 0.2 mg/dL — ABNORMAL LOW (ref 0.3–1.2)
Total Protein: 8 g/dL (ref 6.5–8.1)

## 2020-07-30 LAB — URINALYSIS, ROUTINE W REFLEX MICROSCOPIC
Bilirubin Urine: NEGATIVE
Glucose, UA: NEGATIVE mg/dL
Hgb urine dipstick: NEGATIVE
Ketones, ur: NEGATIVE mg/dL
Leukocytes,Ua: NEGATIVE
Nitrite: NEGATIVE
Protein, ur: NEGATIVE mg/dL
Specific Gravity, Urine: 1.013 (ref 1.005–1.030)
pH: 7 (ref 5.0–8.0)

## 2020-07-30 LAB — RESP PANEL BY RT-PCR (FLU A&B, COVID) ARPGX2
Influenza A by PCR: NEGATIVE
Influenza B by PCR: NEGATIVE
SARS Coronavirus 2 by RT PCR: NEGATIVE

## 2020-07-30 LAB — TROPONIN I (HIGH SENSITIVITY): Troponin I (High Sensitivity): 4 ng/L (ref <18)

## 2020-07-30 LAB — ETHANOL: Alcohol, Ethyl (B): <10 mg/dL (ref <10)

## 2020-07-30 IMAGING — CT CT HEAD W/O CM
3 of 4 series · 14 of 47 positions shown, 16 images · non-contrast
Comparison: Shunt series [DATE], CT brain [DATE],
[DATE]

CLINICAL DATA: Seizure

EXAM:
CT HEAD WITHOUT CONTRAST
TECHNIQUE: Contiguous axial images were obtained from the base of the skull
through the vertex without intravenous contrast.

[Series 2: head wo · axial · 0.47mm/px · z∈[-416,-266]mm · 8 of 36 slices shown, 10 images]
[im 3/36  brain]
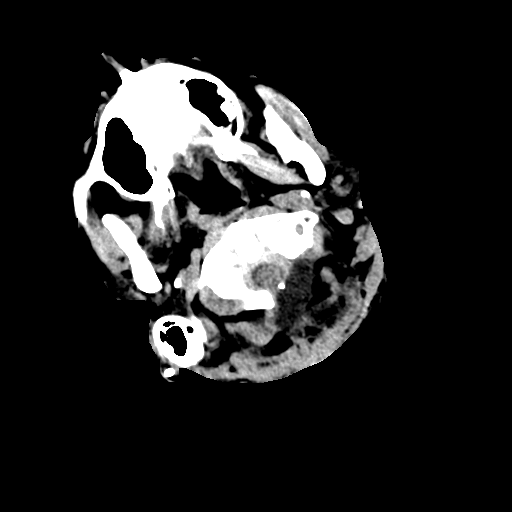
[im 3/36  bone]
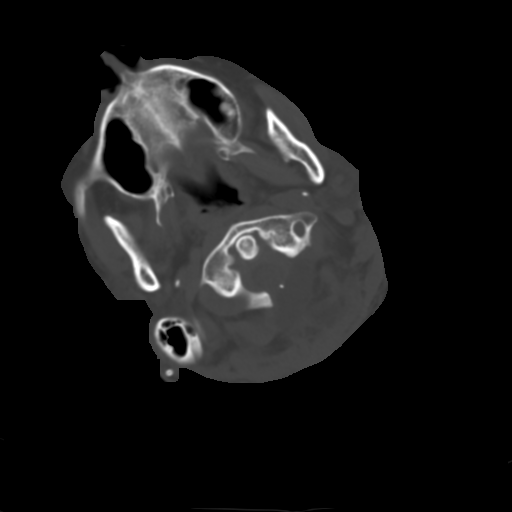
[im 8/36  brain]
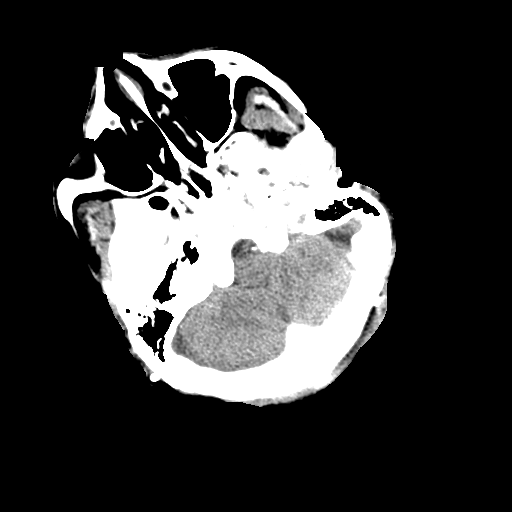
[im 13/36  brain]
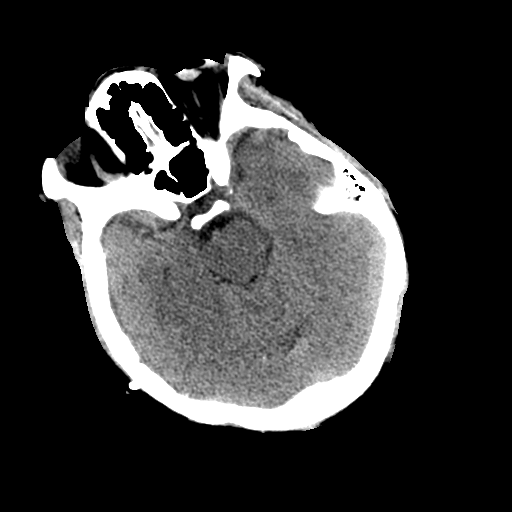
[im 16/36  brain]
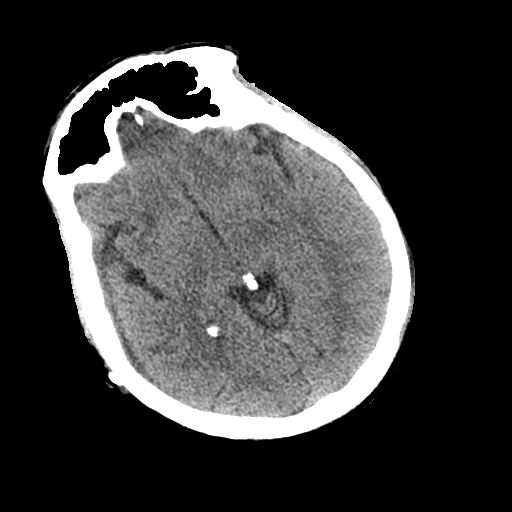
[im 21/36  brain]
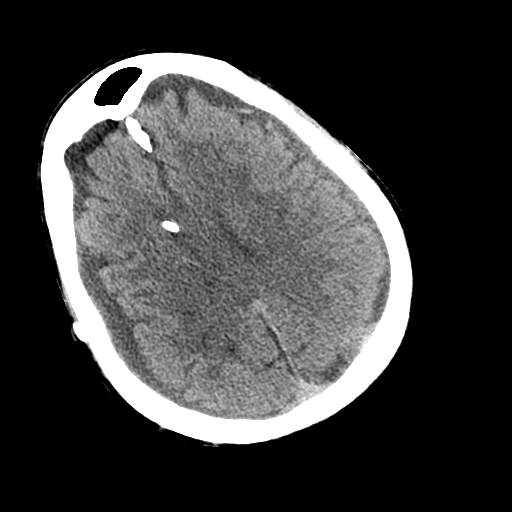
[im 21/36  bone]
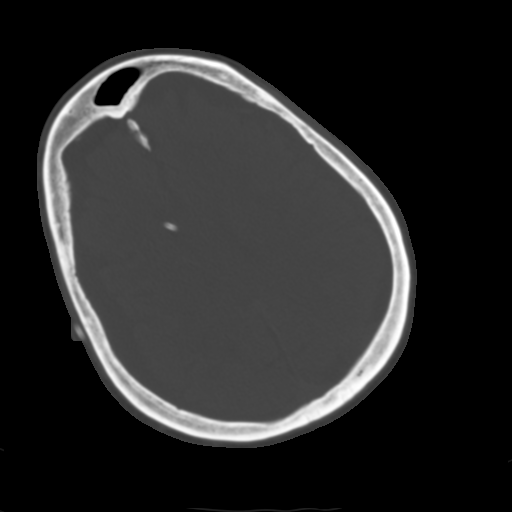
[im 23/36  brain]
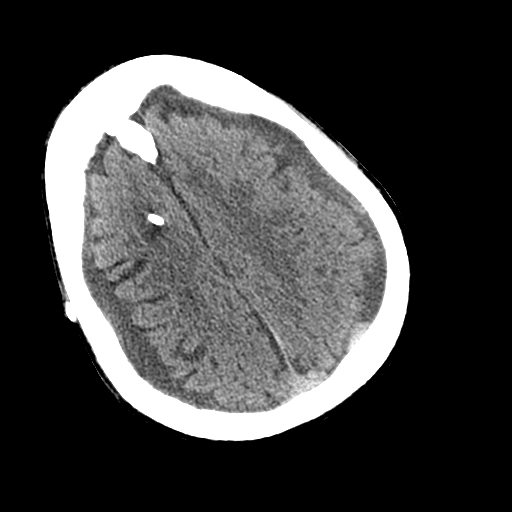
[im 28/36  brain]
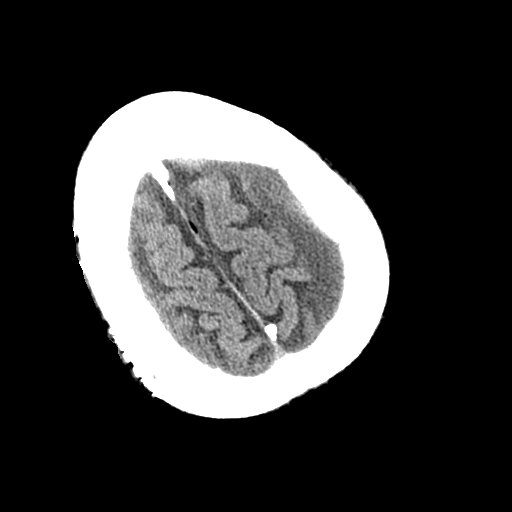
[im 33/36  brain]
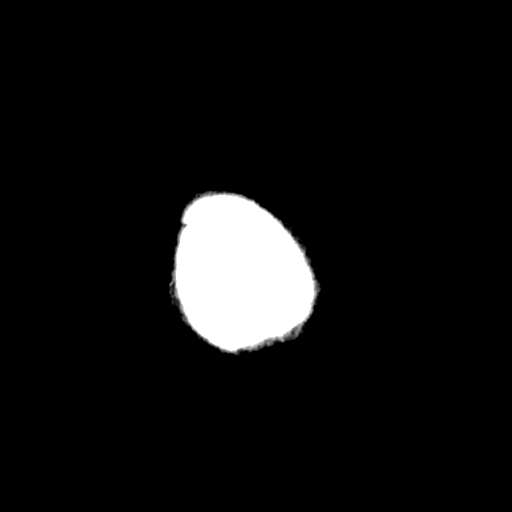

[Series 5: coronal soft tissue · coronal · 0.34mm/px · 3 of 78 slices shown]
[im 26/78  brain]
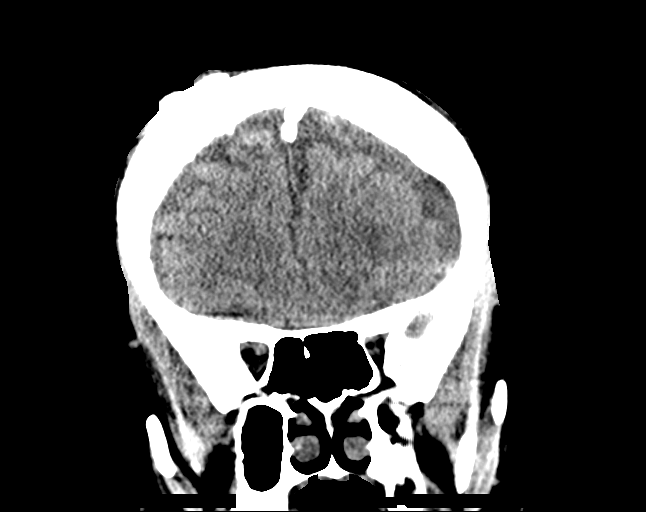
[im 35/78  brain]
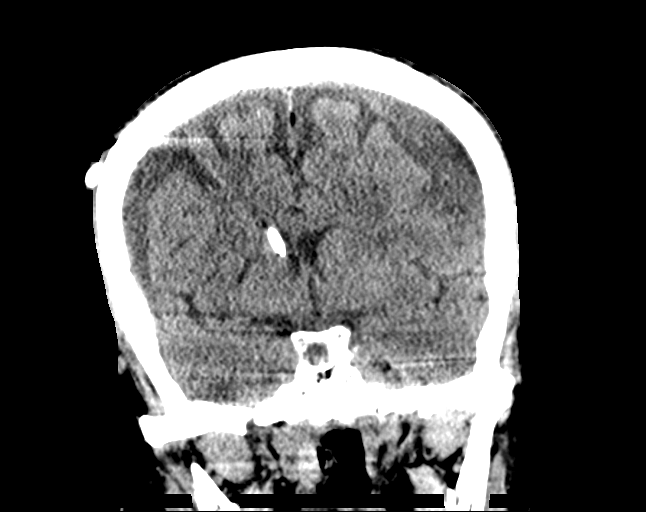
[im 43/78  brain]
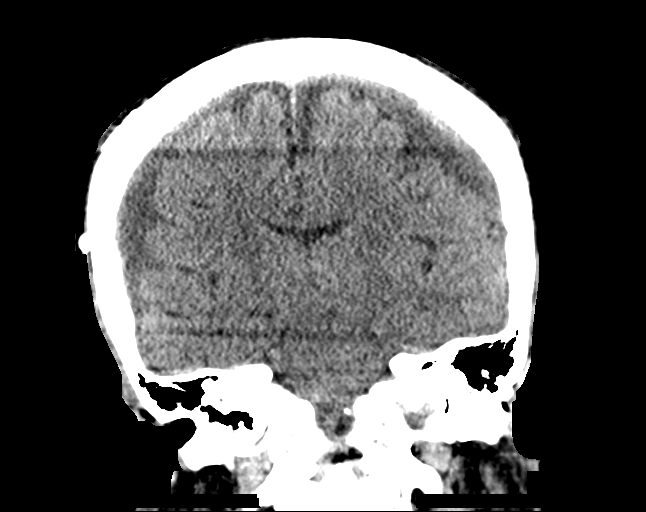

[Series 6: sagittal soft tissue · sagittal · 0.34mm/px · 3 of 58 slices shown]
[im 20/58  brain]
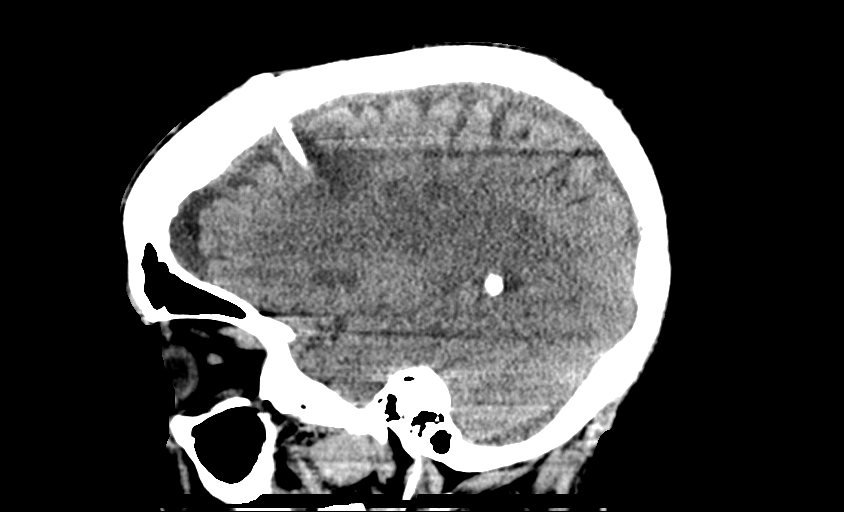
[im 29/58  brain]
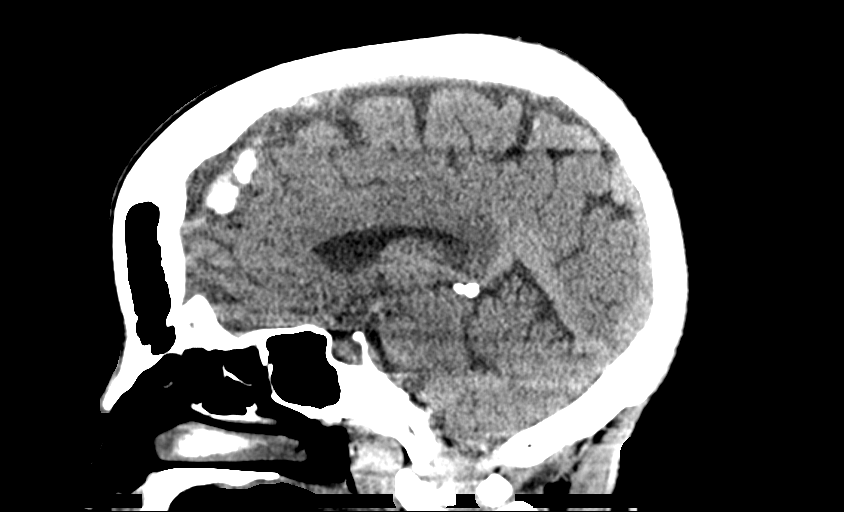
[im 39/58  brain]
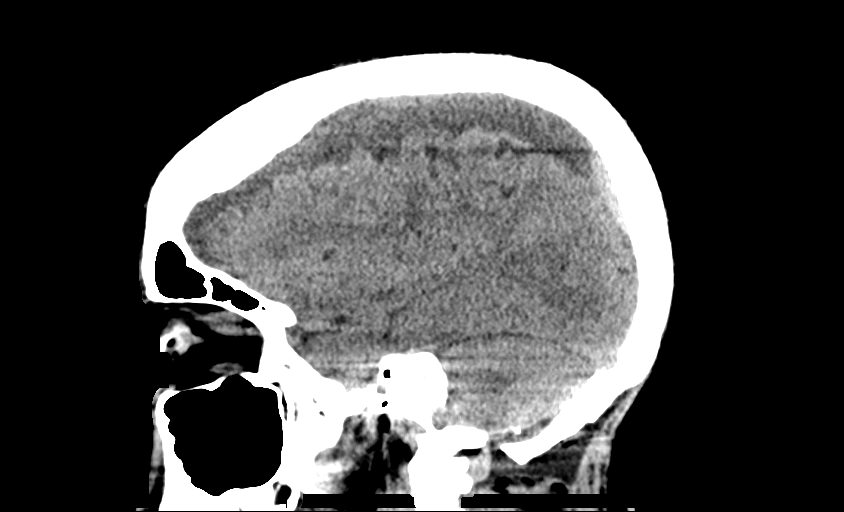

[14 of 47 positions shown; findings below may reference images not displayed]

FINDINGS: Brain: No acute territorial infarction or intracranial mass is
visualized. Right transfrontal shunt catheter terminates in the
right frontal horn region. Further decrease in size of the
ventricles since the prior CT. Small amount of hypodensity along the
right ventricular catheter tract. Chronic lacunar infarcts in the
right basal ganglia. Resolution of previously noted intracranial
air.

Right hemispheric subdural fluid collection, slightly complex,
measures 12 mm in maximum thickness, previously 18 mm. Small amount
of complex extra-axial fluid over the right cerebellar hemisphere
without change.

Interim development of left hemispheric subdural collection,
measuring up to 18 mm maximum thickness. Mild mass effect on
underlying brain parenchyma. Collection appears mostly subacute.
Small foci of intermediate density presumed blood along the
posterior aspect of the collection. No definite acute hyperdense
blood is seen. There is no significant midline shift.

Mild chronic small vessel ischemic change of the white matter.

Vascular: No hyperdense vessels.  Carotid vascular calcification.

Skull: Left sub occipital craniotomy with adjacent overlying fluid
collection/suspected pseudomeningocele.

Sinuses/Orbits: No acute finding.  Left ocular prosthesis.

Other: None
IMPRESSION: 1. New complex left convexity subdural fluid collection measuring up
to 18 mm maximum thickness with appearance suggesting subacute
subdural hematoma, no definite hyperdense acute blood is seen within
the collection. Less than 1-2 mm shift to the right. Some mass
effect of subdural collection on underlying brain parenchyma.
2. Slight interval decrease in size of mildly complex right subdural
fluid collection. Stable thin mildly complex extra-axial collection
at the right posterior fossa
3. Similar positioning of right anterior shunt catheter with further
decrease in size of the ventricular system.
4. Left sub occipital craniotomy with overlying fluid
collection/pseudomeningocele.

Critical Value/emergent results were called by telephone at the time
of interpretation on [DATE] at [DATE] to provider IDRES
, who verbally acknowledged these results.

## 2020-07-30 IMAGING — CR DG SKULL 1-3V
2 series · 2 of 2 positions shown · non-contrast
Comparison: None.

CLINICAL DATA: Seizure, ventriculoperitoneal shunt

EXAM:
SKULL - 1-3 VIEW

[x skull ap]
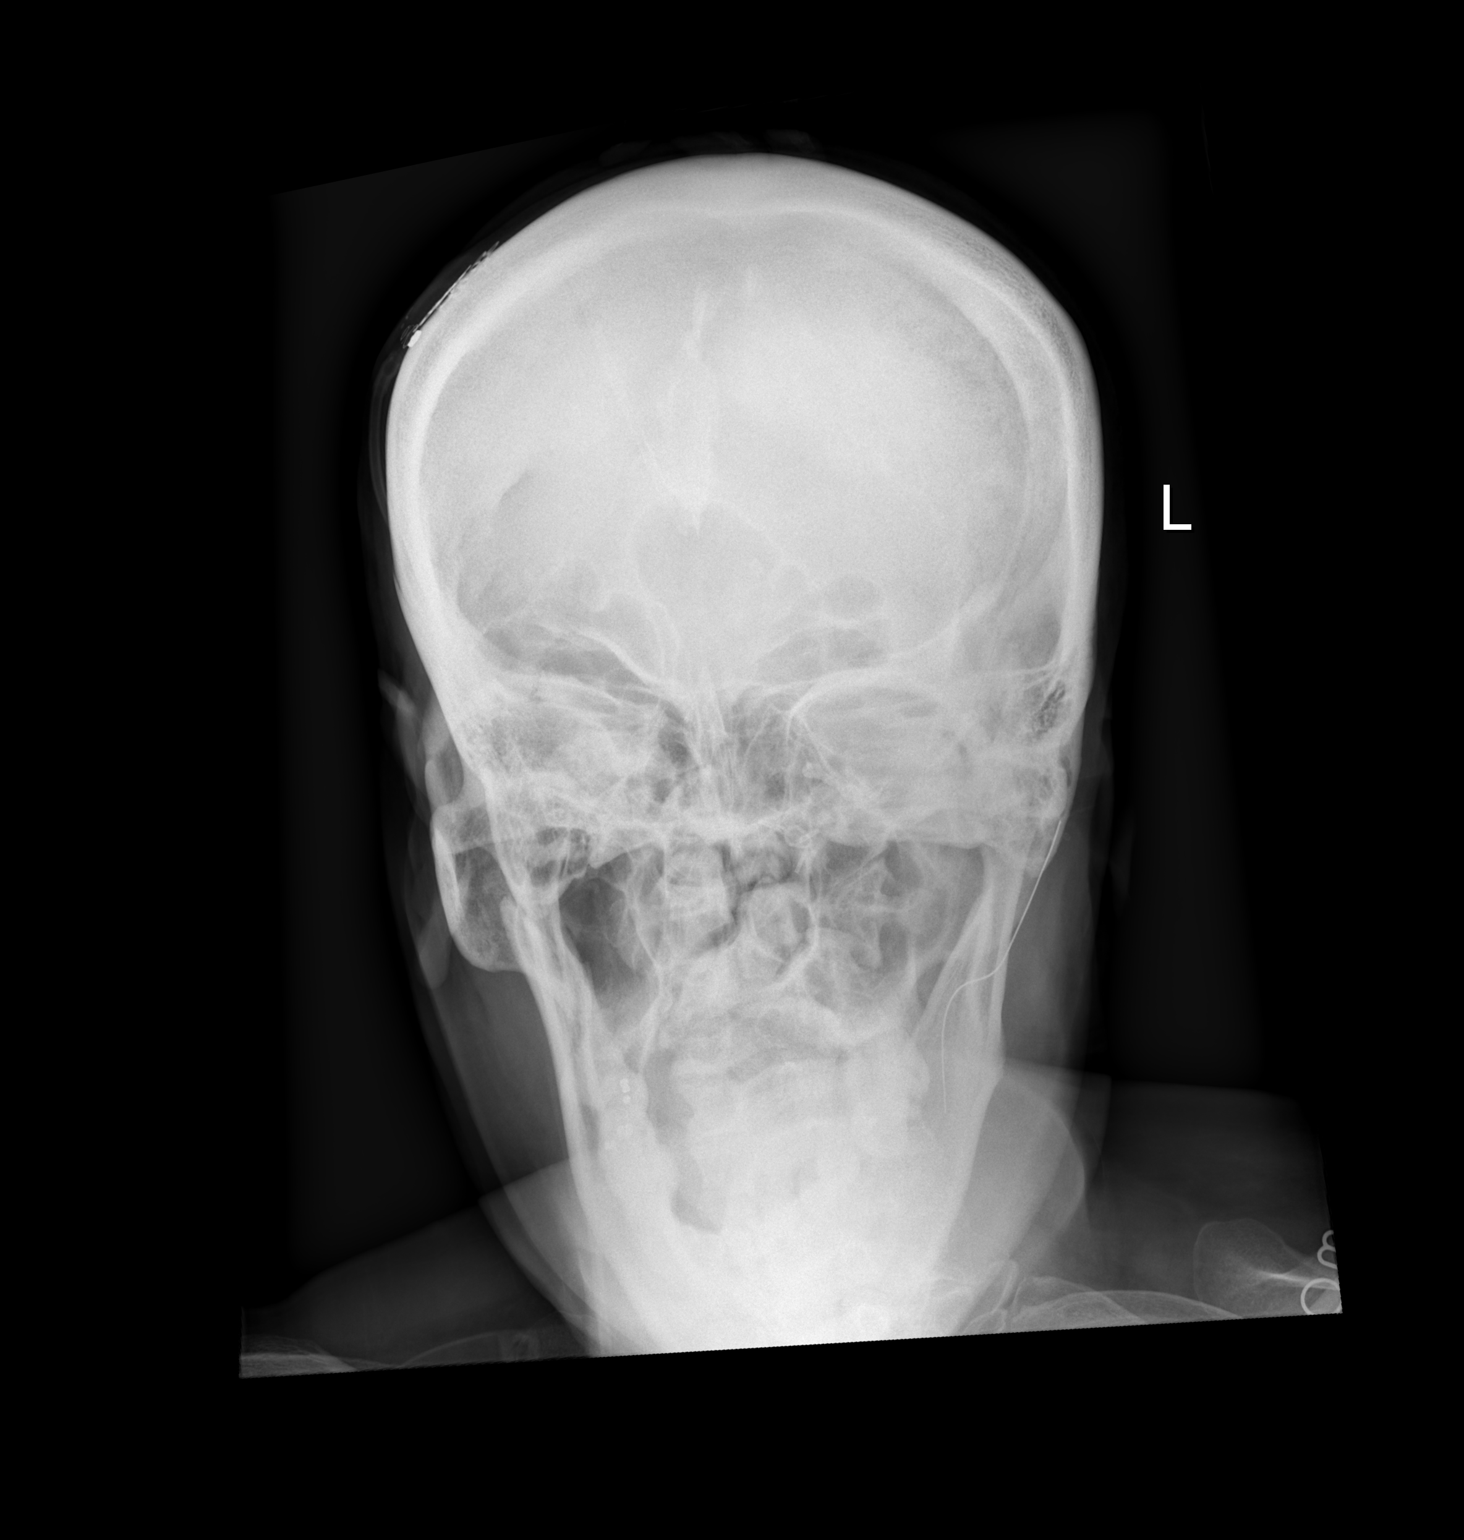

[x skull lat]
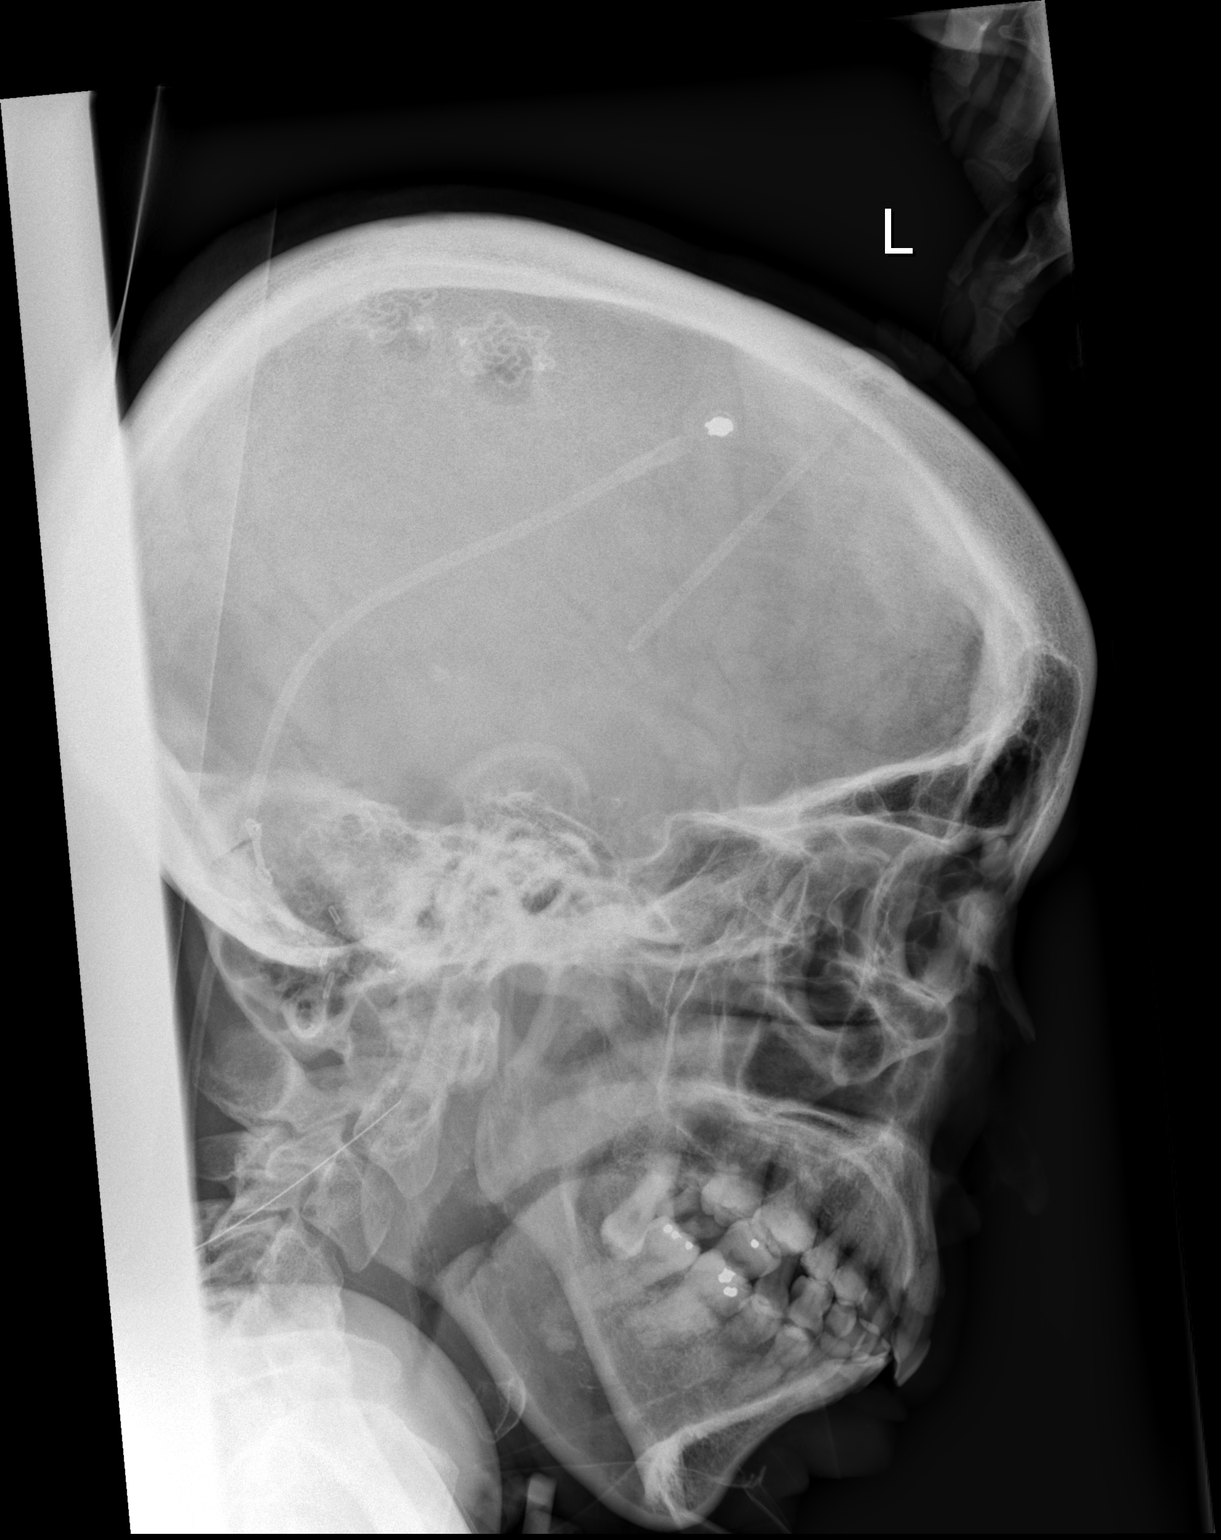

[2 of 2 positions shown; findings below may reference images not displayed]

FINDINGS: Right frontal ventriculostomy catheter tip approaches the expected
location of the foramina [place]. Shunt catheter tubing appears
intact along its extracranial course which can be followed as far as
C3. Multiple right parietal burr holes are identified. The paranasal
sinuses are clear.
IMPRESSION: Extracranial shunt catheter tubing appears intact.

## 2020-07-30 IMAGING — CR DG ABDOMEN 1V
1 series · 1 of 1 positions shown · non-contrast
Comparison: [DATE]

CLINICAL DATA: Seizure, ventriculoperitoneal shunt

EXAM:
ABDOMEN - 1 VIEW

[x abdomen supine]
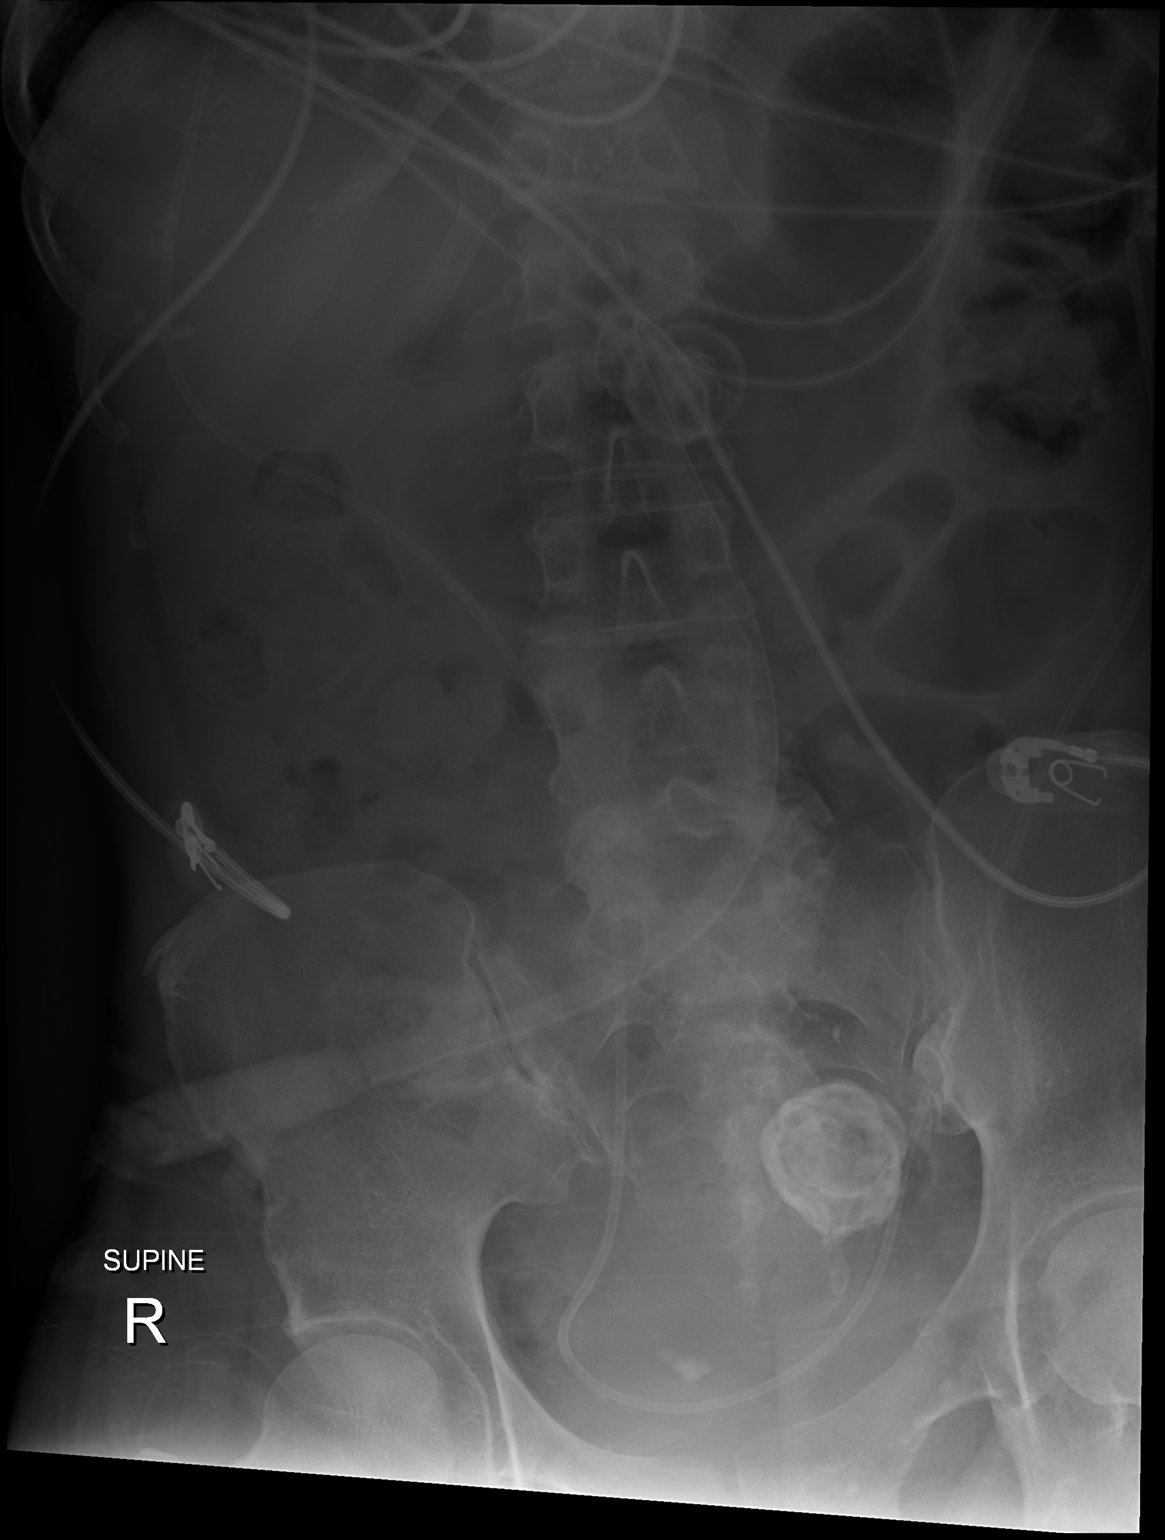

[1 of 1 positions shown; findings below may reference images not displayed]

FINDINGS: Ventriculoperitoneal shunt catheter tubing is now evident overlying
the right upper quadrant, extending into the pelvis, and ultimately
terminating with its tip within the left upper quadrant of the
abdomen. The catheter tubing appears intact along its visualized
course. Normal abdominal gas pattern. Gastrostomy catheter overlies
the gastric lumen. Densely calcified structure within the pelvis in
keeping with an involuted fibroid.
IMPRESSION: Shunt catheter tubing appears intact along its visualized
intra-abdominal course.

## 2020-07-30 IMAGING — CR DG CHEST 1V
1 series · 1 of 1 positions shown · non-contrast
Comparison: [DATE]

CLINICAL DATA: Ventriculoperitoneal shunt malfunction

EXAM:
CHEST  1 VIEW

[x chest ap]
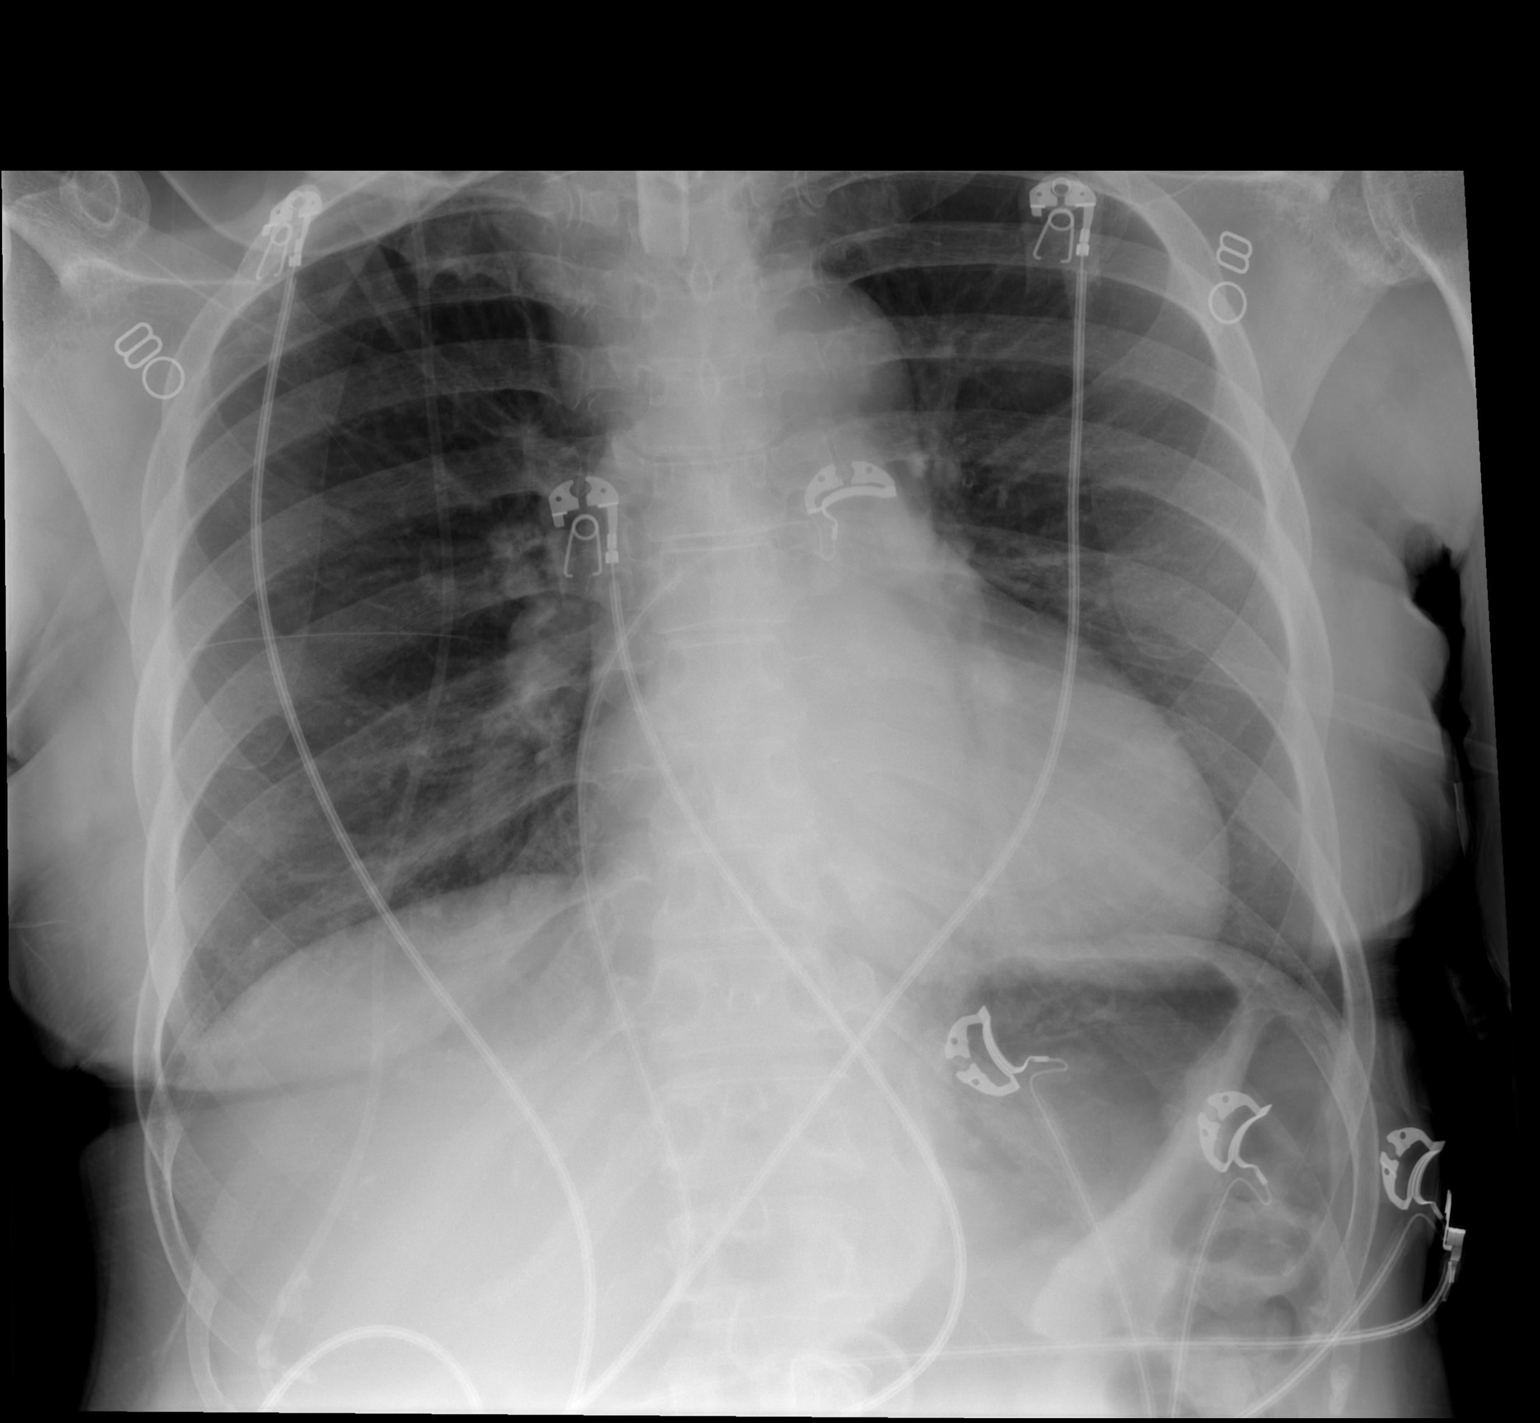

[1 of 1 positions shown; findings below may reference images not displayed]

FINDINGS: Tracheostomy in place. Lungs are clear. No pneumothorax or pleural
effusion. Cardiac size within normal limits. Pulmonary vascularity
is normal. Ventriculoperitoneal shunt catheter tubing overlies the
right hemithorax and is intact along its visualized course.
IMPRESSION: Intact ventriculoperitoneal shunt catheter tubing.

## 2020-07-30 MED ORDER — LEVETIRACETAM IN NACL 1500 MG/100ML IV SOLN
1500.0000 mg | Freq: Once | INTRAVENOUS | Status: AC
  Administered 2020-07-30: 1500 mg via INTRAVENOUS
  Filled 2020-07-30: qty 100

## 2020-07-30 MED ORDER — LORAZEPAM 2 MG/ML IJ SOLN: 2.0000 mg | Freq: Once | INTRAMUSCULAR | Status: AC

## 2020-07-30 MED ORDER — LORAZEPAM 2 MG/ML IJ SOLN
INTRAMUSCULAR | Status: AC
  Administered 2020-07-30: 2 mg via INTRAVENOUS
  Filled 2020-07-30: qty 1

## 2020-07-30 NOTE — ED Notes (Signed)
Pt arrived to room having seizure-like activity. MD notified. IV access established.
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
## 2020-07-30 NOTE — ED Notes (Signed)
Pt arrived to triage 42 minutes after arrival.
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
## 2020-07-31 ENCOUNTER — Inpatient Hospital Stay (HOSPITAL_COMMUNITY): Payer: Medicaid Other

## 2020-07-31 ENCOUNTER — Encounter: Payer: Self-pay | Admitting: Internal Medicine

## 2020-07-31 ENCOUNTER — Other Ambulatory Visit: Payer: Self-pay

## 2020-07-31 DIAGNOSIS — I671 Cerebral aneurysm, nonruptured: Secondary | ICD-10-CM | POA: Diagnosis present

## 2020-07-31 DIAGNOSIS — Z20822 Contact with and (suspected) exposure to covid-19: Secondary | ICD-10-CM | POA: Diagnosis present

## 2020-07-31 DIAGNOSIS — Z931 Gastrostomy status: Secondary | ICD-10-CM | POA: Diagnosis not present

## 2020-07-31 DIAGNOSIS — Z4541 Encounter for adjustment and management of cerebrospinal fluid drainage device: Secondary | ICD-10-CM | POA: Diagnosis not present

## 2020-07-31 DIAGNOSIS — R569 Unspecified convulsions: Secondary | ICD-10-CM | POA: Diagnosis present

## 2020-07-31 DIAGNOSIS — R4182 Altered mental status, unspecified: Secondary | ICD-10-CM | POA: Diagnosis present

## 2020-07-31 DIAGNOSIS — I6203 Nontraumatic chronic subdural hemorrhage: Secondary | ICD-10-CM | POA: Diagnosis present

## 2020-07-31 DIAGNOSIS — S065XAA Traumatic subdural hemorrhage with loss of consciousness status unknown, initial encounter: Secondary | ICD-10-CM | POA: Diagnosis present

## 2020-07-31 DIAGNOSIS — Z930 Tracheostomy status: Secondary | ICD-10-CM | POA: Diagnosis not present

## 2020-07-31 DIAGNOSIS — I6202 Nontraumatic subacute subdural hemorrhage: Secondary | ICD-10-CM | POA: Diagnosis present

## 2020-07-31 DIAGNOSIS — Z982 Presence of cerebrospinal fluid drainage device: Secondary | ICD-10-CM | POA: Diagnosis not present

## 2020-07-31 DIAGNOSIS — S065X9A Traumatic subdural hemorrhage with loss of consciousness of unspecified duration, initial encounter: Secondary | ICD-10-CM | POA: Diagnosis present

## 2020-07-31 DIAGNOSIS — R270 Ataxia, unspecified: Secondary | ICD-10-CM | POA: Diagnosis present

## 2020-07-31 DIAGNOSIS — E890 Postprocedural hypothyroidism: Secondary | ICD-10-CM | POA: Diagnosis present

## 2020-07-31 DIAGNOSIS — Z87891 Personal history of nicotine dependence: Secondary | ICD-10-CM | POA: Diagnosis not present

## 2020-07-31 DIAGNOSIS — G911 Obstructive hydrocephalus: Secondary | ICD-10-CM | POA: Diagnosis present

## 2020-07-31 DIAGNOSIS — Z79899 Other long term (current) drug therapy: Secondary | ICD-10-CM | POA: Diagnosis not present

## 2020-07-31 LAB — RAPID URINE DRUG SCREEN, HOSP PERFORMED
Amphetamines: NOT DETECTED
Barbiturates: NOT DETECTED
Benzodiazepines: NOT DETECTED
Cocaine: NOT DETECTED
Opiates: NOT DETECTED
Tetrahydrocannabinol: NOT DETECTED

## 2020-07-31 MED ORDER — ACETAMINOPHEN 160 MG/5ML PO SOLN: 650.0000 mg | Freq: Four times a day (QID) | ORAL | Status: DC | PRN

## 2020-07-31 MED ORDER — ACETAMINOPHEN 325 MG PO TABS: 650.0000 mg | ORAL_TABLET | Freq: Four times a day (QID) | ORAL | Status: DC | PRN

## 2020-07-31 MED ORDER — ONDANSETRON HCL 4 MG/2ML IJ SOLN
4.0000 mg | Freq: Four times a day (QID) | INTRAMUSCULAR | Status: DC | PRN
Start: 2020-07-31 — End: 2020-07-31

## 2020-07-31 MED ORDER — ONDANSETRON HCL 4 MG PO TABS
4.0000 mg | ORAL_TABLET | Freq: Four times a day (QID) | ORAL | Status: DC | PRN
Start: 2020-07-31 — End: 2020-07-31

## 2020-07-31 MED ORDER — GLYCOPYRROLATE 1 MG PO TABS: 2.0000 mg | ORAL_TABLET | Freq: Two times a day (BID) | ORAL | Status: DC

## 2020-07-31 MED ORDER — HYDRALAZINE HCL 25 MG PO TABS: 50.0000 mg | ORAL_TABLET | Freq: Three times a day (TID) | ORAL | Status: DC

## 2020-07-31 MED ORDER — METOCLOPRAMIDE HCL 10 MG PO TABS
5.0000 mg | ORAL_TABLET | Freq: Three times a day (TID) | ORAL | Status: DC
  Administered 2020-07-31: 5 mg
  Filled 2020-07-31: qty 1

## 2020-07-31 MED ORDER — OSMOLITE 1.2 CAL PO LIQD
355.0000 mL | Freq: Three times a day (TID) | ORAL | Status: DC
  Administered 2020-07-31: 355 mL
  Filled 2020-07-31 (×3): qty 474

## 2020-07-31 MED ORDER — LORAZEPAM 2 MG/ML IJ SOLN: 1.0000 mg | INTRAMUSCULAR | Status: DC | PRN

## 2020-07-31 MED ORDER — LEVETIRACETAM IN NACL 500 MG/100ML IV SOLN
500.0000 mg | Freq: Two times a day (BID) | INTRAVENOUS | Status: DC
  Administered 2020-07-31: 500 mg via INTRAVENOUS
  Filled 2020-07-31: qty 100

## 2020-07-31 MED ORDER — SODIUM CHLORIDE 0.9 % IV SOLN: INTRAVENOUS | Status: DC

## 2020-07-31 MED ORDER — LABETALOL HCL 300 MG PO TABS
300.0000 mg | ORAL_TABLET | Freq: Three times a day (TID) | ORAL | Status: DC
  Administered 2020-07-31: 300 mg
  Filled 2020-07-31 (×2): qty 1

## 2020-07-31 MED ORDER — FREE WATER
200.0000 mL | Freq: Four times a day (QID) | Status: DC
  Administered 2020-07-31: 200 mL

## 2020-07-31 MED ORDER — PANTOPRAZOLE SODIUM 40 MG PO PACK
40.0000 mg | PACK | Freq: Every day | ORAL | Status: DC
  Administered 2020-07-31: 40 mg
  Filled 2020-07-31: qty 20

## 2020-07-31 MED ORDER — LEVOTHYROXINE SODIUM 100 MCG PO TABS
100.0000 ug | ORAL_TABLET | Freq: Every day | ORAL | Status: DC
  Administered 2020-07-31: 100 ug
  Filled 2020-07-31: qty 1

## 2020-07-31 MED ORDER — PROSOURCE TF PO LIQD
45.0000 mL | Freq: Two times a day (BID) | ORAL | Status: DC
  Administered 2020-07-31: 45 mL
  Filled 2020-07-31: qty 45

## 2020-07-31 MED ORDER — LEVETIRACETAM 100 MG/ML PO SOLN: 750.0000 mg | Freq: Two times a day (BID) | ORAL | 6 refills | Status: DC

## 2020-07-31 MED ORDER — QUETIAPINE FUMARATE 50 MG PO TABS
50.0000 mg | ORAL_TABLET | Freq: Two times a day (BID) | ORAL | Status: DC
  Filled 2020-07-31: qty 1

## 2020-07-31 MED ORDER — AMLODIPINE BESYLATE 5 MG PO TABS
10.0000 mg | ORAL_TABLET | Freq: Every day | ORAL | Status: DC
  Filled 2020-07-31: qty 2

## 2020-07-31 NOTE — Progress Notes (Signed)
RT assessed PT- stoma site has small amount of blood (PT states it is not sore. RT cleaned around flange, cleaned inner cannula and placed drain sponge.#6 [place] Cuffless trach and trach tie are secure at this time. PMV is in place and PT is able to speak. Suctioned (see Flowsheet)- tolerated well. RN aware.

## 2020-07-31 NOTE — Progress Notes (Signed)
  NEUROSURGERY PROGRESS NOTE   ED/Admission reviewed. Briefly, patient presented to the emergency department after witnessed seizure-like activity.  Patient does have a history of seizures.  While in the hospital for her ruptured aneurysm, she was on a 750 mg of a Keppra by tube twice daily.  This was discontinued after her discharge from rehab.  She has therefore not been taking any antiepileptic drugs.  A CT scan of her head was obtained which revealed improving right subdural hematoma but new left subacute subdural hematoma.  No significant midline shift.   Ventricles appear more smaller.    Given constellation of image findings, recurrent seizure, I suspect patient's being overshunted.   She is awake and alert in the room.  She is now thing responses.  Lurline Idol is in place.  She follows commands.  Moves all extremities well, nonfocal.  Shunt reservoir refills briskly.  Subdural hematoma does not need to be evacuated at this time,  But it is something that will need to keep a close eye on on an outpatient basis.  Her shunt was interrogated at the bedside.  Setting confirmed at 1.5.  Adjusted to 2.0.   Since patient does have a history of seizures and has been off antiepileptic drugs, I believe the best thing to do would be to restart her prior dosage of Keppra at 750 mg b.i.d..   She has not had any further seizures since being in the emergency department after her initial seizure in the ED.   I have reviewed the imaging in case with Dr. Kathyrn Sheriff.  We believe she is safe for discharge home with close monitoring by her daughter.  We would like to see her in about 1 week for follow-up CT and clinical recheck.

## 2020-07-31 NOTE — ED Notes (Signed)
Spoke to pt's daughter. She will be here a bit after noon to pick up.

## 2020-07-31 NOTE — Discharge Summary (Signed)
Physician Discharge Summary  Patient ID: Stacy Moran MRN: [id number] DOB/AGE: 05-28-1955 65 y.o.  Admit date: 07/30/2020 Discharge date: 07/31/2020  Admission Diagnoses:  Seizure Subacute SDH S/p VP shunt, overshunting  Discharge Diagnoses:  Same Active Problems:   SDH (subdural hematoma) [facility])   Discharged Condition: Stable  Hospital Course:  Stacy Moran is a 65 y.o. female who presented to the emergency department after witnessed seizure-like activity.  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  She has not been on this medication for several months.  A CT scan of her head was obtained which revealed improving right subdural hematoma but new left subacute subdural hematoma.  No significant midline shift.   Ventricles appear more smaller.    Given constellation of image findings, it appeared patient was being overshunted.   She was otherwise neurologically intact.  Subdural hematoma did not to be evacuated. Her shunt was interrogated  At the bedside.  Setting confirmed at 1.5.  Adjusted to 2.0.   Since patient does have a history of seizures and has been off antiepileptic drugs,she was restarted on her prior dosage of Keppra at 750 mg b.i.d..   She has not had any further seizures since being in the emergency department after her initial seizure in the ED.  She was therefore discahrged with outpatient f/u in 1 week.   Patient was discharged in hemodynamically stable condition.  Treatments: Surgery - none  Discharge Exam: Blood pressure 120/65, pulse 77, temperature 98.3 F (36.8 C), temperature source Oral, resp. rate 18, SpO2 97 %. She is awake and alert in the room.  She is now thing responses.  [name] is in place.  She follows commands.  Moves all extremities well, nonfocal.  Shunt reservoir refills briskly.  Disposition: Discharge disposition: 01-Home or Self  Care        Allergies as of 07/31/2020   No Known Allergies     Medication List    TAKE these medications   acetaminophen 325 MG tablet Commonly known as: TYLENOL Place 2 tablets (650 mg total) into feeding tube every 6 (six) hours as needed for mild pain.   amLODipine 10 MG tablet Commonly known as: NORVASC PLACE 1 TABLET (10 MG TOTAL) INTO FEEDING TUBE DAILY.   feeding supplement (OSMOLITE 1.2 CAL) Liqd Place 355 mLs into feeding tube 4 (four) times daily -  with meals and at bedtime.   feeding supplement (PROSource TF) liquid Place 45 mLs into feeding tube 2 (two) times daily.   free water Soln Place 200 mLs into feeding tube every 6 (six) hours.   glycopyrrolate 1 MG tablet Commonly known as: ROBINUL PLACE 2 TABLETS (2 MG TOTAL) INTO FEEDING TUBE TWO TIMES DAILY.   hydrALAZINE 50 MG tablet Commonly known as: APRESOLINE Place 1 tablet (50 mg total) into feeding tube every 8 (eight) hours.   labetalol 300 MG tablet Commonly known as: NORMODYNE Place 1 tablet (300 mg total) into feeding tube 3 (three) times daily.   levETIRAcetam 100 MG/ML solution Commonly known as: Keppra Place 7.5 mLs (750 mg total) into feeding tube 2 (two) times daily.   levothyroxine 100 MCG tablet Commonly known as: SYNTHROID Place 1 tablet (100 mcg total) into feeding tube daily at 6 (six) AM.   metoCLOPramide 5 MG tablet Commonly known as: Reglan Place 1 tablet (5 mg total) into feeding tube 3 (three) times daily.  QUEtiapine 50 MG tablet Commonly known as: SEROQUEL PLACE 1 TABLET (50 MG TOTAL) INTO FEEDING TUBE 2 (TWO) TIMES DAILY.   scopolamine 1 MG/3DAYS Commonly known as: TRANSDERM-SCOP PLACE 1 PATCH (1.5 MG TOTAL) ONTO THE SKIN EVERY 3 (THREE) DAYS.       Follow-up Information    Lisbeth Renshaw, MD. Schedule an appointment as soon as possible for a visit in 1 week(s).   Specialty: Neurosurgery Contact information: [address] [place]  [address] [phone]               Signed: Alyson Ingles 07/31/2020, 9:35 AM

## 2020-07-31 NOTE — Progress Notes (Signed)
PT sleeping at this time (vitals stable- see Flowsheet). Spoke with RN, asked that RT be notified when PT awakens and / or receives bed at [facility].

## 2020-07-31 NOTE — Progress Notes (Signed)
PT sleeping at this time- RT removed PMV and placed in pink denture cup- RN aware.

## 2020-07-31 NOTE — H&P (Signed)
Stacy Moran is an 65 y.o. female.   HPI:  65 year old female presented to the ED tonight after having a seizure. She apparently has had 2 tonic clonic seizures while in the ED. She is a patient of Dr. Cleotilde Neer. She has had a VP shunt placed and an aneurysm clipping in the last 6 months. She has a trach and feeding tube. Patient denies any headaches, NV, dizziness. Reports she Is not on any seizure medications  Past Medical History:  Diagnosis Date  . Prosthetic eye globe     Past Surgical History:  Procedure Laterality Date  . BURR HOLE N/A 03/18/2020   Procedure: Haskell Flirt;  Surgeon: [name], MD;  Location: [place];  Service: Neurosurgery;  Laterality: N/A;  . CRANIOTOMY Left 01/30/2020   Procedure: LEFT FAR LATERAL CRANIOTOMY FOR ANEURYSM CLIPPING;  Surgeon: [name], MD;  Location: [place];  Service: Neurosurgery;  Laterality: Left;  . DIRECT LARYNGOSCOPY N/A 02/29/2020   Procedure: DIRECT LARYNGOSCOPY;  Surgeon: [name], MD;  Location: [place];  Service: ENT;  Laterality: N/A;  . IR ANGIO INTRA EXTRACRAN SEL INTERNAL CAROTID BILAT MOD SED  01/30/2020  . IR ANGIO VERTEBRAL SEL VERTEBRAL UNI L MOD SED  01/30/2020  . IR CM INJ ANY COLONIC TUBE W/FLUORO  07/03/2020  . IR GASTROSTOMY TUBE MOD SED  02/22/2020  . IR [place] TUBE PERCUT W/FLUORO  07/03/2020  . LAPAROSCOPIC REVISION VENTRICULAR-PERITONEAL (V-P) SHUNT N/A 03/18/2020   Procedure: LAPAROSCOPIC REVISION VENTRICULAR-PERITONEAL (V-P) SHUNT;  Surgeon: [name], MD;  Location: [place];  Service: General;  Laterality: N/A;  . RADIOLOGY WITH ANESTHESIA N/A 01/30/2020   Procedure: IR WITH ANESTHESIA;  Surgeon: [name], MD;  Location: [place];  Service: Radiology;  Laterality: N/A;  . THYROIDECTOMY N/A 02/08/2020   Procedure: THYROIDECTOMY;  Surgeon: [name], MD;  Location: [place];  Service: ENT;  Laterality: N/A;  . TRACHEOSTOMY TUBE PLACEMENT N/A 02/08/2020   Procedure: TRACHEOSTOMY;   Surgeon: [name], MD;  Location: [place];  Service: ENT;  Laterality: N/A;  . TRACHEOSTOMY TUBE PLACEMENT N/A 02/29/2020   Procedure: TRACHEOSTOMY EXCHANGE;  Surgeon: [name], MD;  Location: [place];  Service: ENT;  Laterality: N/A;  . VENTRICULOPERITONEAL SHUNT N/A 03/18/2020   Procedure: RIGHT SHUNT INSERTION VENTRICULAR-PERITONEAL/ BURR HOLE Evacuation of Subdural Hematoma;  Surgeon: [name], MD;  Location: [place];  Service: Neurosurgery;  Laterality: N/A;    No Known Allergies  Social History   Tobacco Use  . Smoking status: Former Research scientist (life sciences)  . Smokeless tobacco: Never Used  Substance Use Topics  . Alcohol use: Not Currently    No family history on file.   Review of Systems  Positive ROS: as above   All other systems have been reviewed and were otherwise negative with the exception of those mentioned in the HPI and as above.  Objective: Vital signs in last 24 hours: Temp:  [98.3 F (36.8 C)] 98.3 F (36.8 C) (05/04 2055) Pulse Rate:  [75-92] 75 (05/05 0000) Resp:  [13-20] 15 (05/05 0000) BP: (99-163)/(59-106) 135/72 (05/05 0000) SpO2:  [99 %-100 %] 100 % (05/05 0000) FiO2 (%):  [28 %] 28 % (05/04 2332)  General Appearance: Alert, cooperative, no distress, appears stated age Head: Normocephalic, without obvious abnormality, atraumatic Eyes: PERRL, conjunctiva/corneas clear, EOM's intact, fundi benign, both eyes      Throat: trach Neck: Supple, symmetrical, trachea midline, no adenopathy; thyroid: No enlargement/tenderness/nodules; no carotid bruit or JVD Lungs:  respirations unlabored,  on trach collar Heart: Regular rate and rhythm Skin: Skin color, texture, turgor normal, no rashes or lesions  NEUROLOGIC:   Mental status: A&O x4, nonverbal with trach, good attention span, Memory and fund of knowledge Motor Exam - grossly normal, normal tone and bulk Sensory Exam - grossly normal Reflexes: symmetric, no pathologic reflexes, No Hoffman's, No  clonus Coordination - no tested Gait - not tested Balance - not tested Cranial Nerves: I: smell Not tested  II: visual acuity  OS: na    OD: na  II: visual fields Full to confrontation  II: pupils Equal, round, reactive to light  III,VII: ptosis None  III,IV,VI: extraocular muscles  Full ROM  V: mastication Normal  V: facial light touch sensation  Normal  V,VII: corneal reflex  Present  VII: facial muscle function - upper  Normal  VII: facial muscle function - lower Normal  VIII: hearing Not tested  IX: soft palate elevation  Normal  IX,X: gag reflex Present  XI: trapezius strength  5/5  XI: sternocleidomastoid strength 5/5  XI: neck flexion strength  5/5  XII: tongue strength  Normal    Data Review Lab Results  Component Value Date   WBC 8.1 07/30/2020   HGB 11.7 (L) 07/30/2020   HCT 38.4 07/30/2020   MCV 84.6 07/30/2020   PLT 138 (L) 07/30/2020   Lab Results  Component Value Date   NA 144 07/30/2020   K 4.0 07/30/2020   CL 107 07/30/2020   CO2 28 07/30/2020   BUN 28 (H) 07/30/2020   CREATININE 0.68 07/30/2020   GLUCOSE 114 (H) 07/30/2020   Lab Results  Component Value Date   INR 1.0 03/13/2020    Radiology: DG Skull 1-3 Views  Result Date: 07/30/2020 CLINICAL DATA:  Seizure, ventriculoperitoneal shunt EXAM: SKULL - 1-3 VIEW COMPARISON:  None. FINDINGS: Right frontal ventriculostomy catheter tip approaches the expected location of the foramina [place]. Shunt catheter tubing appears intact along its extracranial course which can be followed as far as C3. Multiple right parietal burr holes are identified. The paranasal sinuses are clear. IMPRESSION: Extracranial shunt catheter tubing appears intact. Electronically Signed   By: Fidela Salisbury MD   On: 07/30/2020 22:34   DG Chest 1 View  Result Date: 07/30/2020 CLINICAL DATA:  Ventriculoperitoneal shunt malfunction EXAM: CHEST  1 VIEW COMPARISON:  05/11/2020 FINDINGS: Tracheostomy in place. Lungs are clear. No  pneumothorax or pleural effusion. Cardiac size within normal limits. Pulmonary vascularity is normal. Ventriculoperitoneal shunt catheter tubing overlies the right hemithorax and is intact along its visualized course. IMPRESSION: Intact ventriculoperitoneal shunt catheter tubing. Electronically Signed   By: Fidela Salisbury MD   On: 07/30/2020 22:24   DG Abd 1 View  Result Date: 07/30/2020 CLINICAL DATA:  Seizure, ventriculoperitoneal shunt EXAM: ABDOMEN - 1 VIEW COMPARISON:  02/15/2020 FINDINGS: Ventriculoperitoneal shunt catheter tubing is now evident overlying the right upper quadrant, extending into the pelvis, and ultimately terminating with its tip within the left upper quadrant of the abdomen. The catheter tubing appears intact along its visualized course. Normal abdominal gas pattern. Gastrostomy catheter overlies the gastric lumen. Densely calcified structure within the pelvis in keeping with an involuted fibroid. IMPRESSION: Shunt catheter tubing appears intact along its visualized intra-abdominal course. Electronically Signed   By: Fidela Salisbury MD   On: 07/30/2020 22:37   CT Head Wo Contrast  Result Date: 07/30/2020 CLINICAL DATA:  Seizure EXAM: CT HEAD WITHOUT CONTRAST TECHNIQUE: Contiguous axial images were obtained from the base of  the skull through the vertex without intravenous contrast. COMPARISON:  Shunt series 07/30/2020, CT brain 03/19/2020, 02/25/2020 FINDINGS: Brain: No acute territorial infarction or intracranial mass is visualized. Right transfrontal shunt catheter terminates in the right frontal horn region. Further decrease in size of the ventricles since the prior CT. Small amount of hypodensity along the right ventricular catheter tract. Chronic lacunar infarcts in the right basal ganglia. Resolution of previously noted intracranial air. Right hemispheric subdural fluid collection, slightly complex, measures 12 mm in maximum thickness, previously 18 mm. Small amount of complex  extra-axial fluid over the right cerebellar hemisphere without change. Interim development of left hemispheric subdural collection, measuring up to 18 mm maximum thickness. Mild mass effect on underlying brain parenchyma. Collection appears mostly subacute. Small foci of intermediate density presumed blood along the posterior aspect of the collection. No definite acute hyperdense blood is seen. There is no significant midline shift. Mild chronic small vessel ischemic change of the white matter. Vascular: No hyperdense vessels.  Carotid vascular calcification. 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 Sinuses/Orbits: No acute finding.  Left ocular prosthesis. Other: None IMPRESSION: 1. New complex left convexity subdural fluid collection measuring up to 18 mm maximum thickness with appearance suggesting subacute subdural hematoma, no definite hyperdense acute blood is seen within the collection. Less than 1-2 mm shift to the right. Some mass effect of subdural collection on underlying brain parenchyma. 2. Slight interval decrease in size of mildly complex right subdural fluid collection. Stable thin mildly complex extra-axial collection at the right posterior fossa 3. Similar positioning of right anterior shunt catheter with further decrease in size of the ventricular system. 4. 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 Critical Value/emergent results were called by telephone at the time of interpretation on 07/30/2020 at 11:03 pm to provider MATTHEW TRIFAN , who verbally acknowledged these results. Electronically Signed   By: Donavan Foil M.D.   On: 07/30/2020 23:03    Assessment/Plan: 65 year old female presented to the ED with seizures. CT head showed a new left subdural collection about 36mm in size that looks more subacute. Compared to her previous scan she does have a decrease in size of the right  subdural. Her ventricles are significantly smaller compared to her previous scan. I do think that she is probably being over drained and her shunt pressure needs to be raised. This is likely the source of her sdh. Will admit to 4NP at [facility] and place her on keppra. Will make Dr. Kathyrn Sheriff aware tomorrow.    Ocie Cornfield Tamarius Rosenfield 07/31/2020 1:06 AM

## 2020-08-08 ENCOUNTER — Ambulatory Visit: Payer: Self-pay | Attending: Internal Medicine | Admitting: Internal Medicine

## 2020-08-08 ENCOUNTER — Other Ambulatory Visit: Payer: Self-pay

## 2020-08-08 ENCOUNTER — Encounter: Payer: Self-pay | Admitting: Neurology

## 2020-08-08 ENCOUNTER — Encounter: Payer: Self-pay | Admitting: Internal Medicine

## 2020-08-08 VITALS — BP 122/74 | HR 70 | Resp 16 | Wt 128.0 lb

## 2020-08-08 DIAGNOSIS — D649 Anemia, unspecified: Secondary | ICD-10-CM

## 2020-08-08 DIAGNOSIS — I6930 Unspecified sequelae of cerebral infarction: Secondary | ICD-10-CM

## 2020-08-08 DIAGNOSIS — R269 Unspecified abnormalities of gait and mobility: Secondary | ICD-10-CM

## 2020-08-08 DIAGNOSIS — D696 Thrombocytopenia, unspecified: Secondary | ICD-10-CM

## 2020-08-08 DIAGNOSIS — Z23 Encounter for immunization: Secondary | ICD-10-CM

## 2020-08-08 DIAGNOSIS — F411 Generalized anxiety disorder: Secondary | ICD-10-CM

## 2020-08-08 DIAGNOSIS — I69398 Other sequelae of cerebral infarction: Secondary | ICD-10-CM

## 2020-08-08 DIAGNOSIS — G40309 Generalized idiopathic epilepsy and epileptic syndromes, not intractable, without status epilepticus: Secondary | ICD-10-CM

## 2020-08-08 DIAGNOSIS — Z930 Tracheostomy status: Secondary | ICD-10-CM

## 2020-08-08 DIAGNOSIS — F5102 Adjustment insomnia: Secondary | ICD-10-CM

## 2020-08-08 MED ORDER — CLONAZEPAM 0.5 MG PO TABS: 0.5000 mg | ORAL_TABLET | Freq: Every evening | ORAL | 1 refills | Status: DC | PRN

## 2020-08-08 NOTE — Patient Instructions (Addendum)
I have referred you to a neurologist. We will check the level of Keppra in your system today to make sure that you are on an adequate dose. We will recheck your blood count today as you did have a mild anemia and low platelet count.  We will get you scheduled for personal care services at home.  Use the clonazepam as needed to help decrease anxiety and help with sleep.   Td (Tetanus, Diphtheria) Vaccine: What You Need to Know 1. Why get vaccinated? Td vaccine can prevent tetanus and diphtheria. Tetanus enters the body through cuts or wounds. Diphtheria spreads from person to person.  TETANUS (T) causes painful stiffening of the muscles. Tetanus can lead to serious health problems, including being unable to open the mouth, having trouble swallowing and breathing, or death.  DIPHTHERIA (D) can lead to difficulty breathing, heart failure, paralysis, or death. 2. Td vaccine Td is only for children 7 years and older, adolescents, and adults.  Td is usually given as a booster dose every 10 years, or after 5 years in the case of a severe or dirty wound or burn. Another vaccine, called "Tdap," may be used instead of Td. Tdap protects against pertussis, also known as "whooping cough," in addition to tetanus and diphtheria. Td may be given at the same time as other vaccines. 3. Talk with your health care provider Tell your vaccination provider if the person getting the vaccine:  Has had an allergic reaction after a previous dose of any vaccine that protects against tetanus or diphtheria, or has any severe, life-threatening allergies  Has ever had Guillain-Barr Syndrome (also called "GBS")  Has had severe pain or swelling after a previous dose of any vaccine that protects against tetanus or diphtheria In some cases, your health care provider may decide to postpone Td vaccination until a future visit. People with minor illnesses, such as a cold, may be vaccinated. People who are moderately or  severely ill should usually wait until they recover before getting Td vaccine.  Your health care provider can give you more information. 4. Risks of a vaccine reaction  Pain, redness, or swelling where the shot was given, mild fever, headache, feeling tired, and nausea, vomiting, diarrhea, or stomachache sometimes happen after Td vaccination. People sometimes faint after medical procedures, including vaccination. Tell your provider if you feel dizzy or have vision changes or ringing in the ears.  As with any medicine, there is a very remote chance of a vaccine causing a severe allergic reaction, other serious injury, or death. 5. What if there is a serious problem? An allergic reaction could occur after the vaccinated person leaves the clinic. If you see signs of a severe allergic reaction (hives, swelling of the face and throat, difficulty breathing, a fast heartbeat, dizziness, or weakness), call 9-1-1 and get the person to the nearest hospital.  For other signs that concern you, call your health care provider.  Adverse reactions should be reported to the Vaccine Adverse Event Reporting System (VAERS). Your health care provider will usually file this report, or you can do it yourself. Visit the VAERS website at www.vaers.SamedayNews.es or call (365) 419-7549. VAERS is only for reporting reactions, and VAERS staff members do not give medical advice. 6. The National Vaccine Injury Compensation Program The [employer] Vaccine Injury Compensation Program (VICP) is a federal program that was created to compensate people who may have been injured by certain vaccines. Claims regarding alleged injury or death due to vaccination have a time limit  for filing, which may be as short as two years. Visit the VICP website at GoldCloset.com.ee or call 347-625-2676 to learn about the program and about filing a claim. 7. How can I learn more?  Ask your health care provider.  Call your local or state health  department.  Visit the website of the Food and Drug Administration (FDA) for vaccine package inserts and additional information at TraderRating.uy.  Contact the Centers for Disease Control and Prevention (CDC): ? Call 403-442-1818 (1-800-CDC-INFO) or ? Visit CDC's website at http://hunter.com/. Vaccine Information Statement Td (Tetanus, Diphtheria) Vaccine (11/02/2019) This information is not intended to replace advice given to you by your health care provider. Make sure you discuss any questions you have with your health care provider. Document Revised: 12/20/2019 Document Reviewed: 12/20/2019 Elsevier Patient Education  2021 [employer].

## 2020-08-08 NOTE — Progress Notes (Signed)
Patient ID: Stacy Moran, female    DOB: [birth year]  MRN: [id number]  CC: Hospitalization Follow-up   Subjective: Stacy Moran is a 65 y.o. female who presents for 9-month follow-up and hospital follow-up.  Daughter [name] is with her and provides most of the history as patient unable to make clear vocalizations.  She is able to say yes or no with head movements Her concerns today include:  Patient with history of HTN, LT SAH (01/2020-with subsequent placement of trach, PEG tube, VP shunt), postsurgical hypothyroidism, ventriculostomy, left PICA aneurysm status post clipping, prediabetes, seizures, functional urinary incontinence  Since last visit with me, patient hospitalized 5/4-07/2020 with witnessed tonic-clonic seizure.  She was with her daughter in the car when she started seizing. -CT of the head revealed improving right subdural hematoma but new left subacute subdural hematoma, no significant midline shift, ventricles appeared decreased in size.  Seen by neurosurgeon.  Assessed to be over shunted.  Shunt was interrogated.  Started on Keppra 750 mg twice daily.  CBC noted to have mild anemia with mild thrombocytopenia.  Discharged home in a stable condition with plans to follow-up with the neurosurgeon.  Today: Sz Disorder: No further seizures since hospital discharge.  Tolerating Keppra okay.  No dizziness or drowsiness.  Has appointment coming up with the neurosurgeon next month. -Daughter reports major issue is patient being restless and anxious at night.  Poor sleep.  She would sleep for 1 to 2 hours then up for a few hours and then the cycle repeats itself.  On Seroquel 50 mg twice daily but states she was told by Dr. Posey Pronto that some nights she can give the patient 2 tablets at bedtime.  This does not seem to help too much.  Ritalin and clonazepam discontinued by Dr. Posey Pronto on last visit earlier this month.  Tracheostomy: Has been seen in trach clinic since last visit with me.  Given  scopolamine patch, valve for the trach and a PPI.  Daughter reports that with these changes the patient is not salivating and getting choked on her cough.  Mucus is not as thick and has been minimal.  Saw ENT Dr. Constance Holster yesterday.  He has ordered repeat swallow test and then will make further decisions about whether trach can eventually be removed. Feedings are going well.  Patient has been approved for disability, Medicare and Medicaid.  Daughter still on FMLA.  She requests PCS services for the patient.  Patient is ambulating at home now without her walker but has to hold onto the walls and gait is very slow.  Uses walker when she is out in public.  She has not had any falls.  Able to clothe herself.  Baths her self in a basin.  HM: Due for Tdap.  Also due for mammogram.  We decided to hold off on mammogram for now until patient's strength is improved a little bit more.  We will refer for mammogram later in the year.  Patient Active Problem List   Diagnosis Date Noted  . SDH (subdural hematoma) ([place]) 07/31/2020  . History of hemorrhagic cerebrovascular accident (CVA) with residual deficit 06/06/2020  . 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 S/P ventriculoperitoneal shunt 06/06/2020  . Functional urinary incontinence 06/06/2020  . Ineffective airway clearance   . Thrombocytopenia ([place])   . Restlessness   . Restlessness and agitation   . Acute blood loss anemia   . Essential hypertension   . Seizure prophylaxis   . Protein-calorie malnutrition, severe 04/24/2020  .  Dysphagia, post-stroke   . Benign essential HTN   . Dysphagia   . Status post tracheostomy ([place])   . S/P percutaneous endoscopic gastrostomy (PEG) tube placement ([place])   . ICH (intracerebral hemorrhage) ([place]) 04/22/2020  . Obstructive hydrocephalus ([place])   . Seizure-like activity ([place]) 03/12/2020  . PEG (percutaneous endoscopic gastrostomy) status ([place]) 03/08/2020  . Hyperglycemia 03/08/2020  . Acute respiratory failure  ([place])   . Ventilator dependence ([place])   . 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 Abdominal distention   . 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 SAH (subarachnoid hemorrhage) ([place])   . Subarachnoid bleed ([place])   . Tachypnea   . Prediabetes   . Hypokalemia   . Leukocytosis   . Ileus, postoperative ([place])   . Pressure injury of skin 02/13/2020  . 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 acetaminophen (TYLENOL) 325 MG tablet Place 2 tablets (650 mg total) into feeding tube every 6 (six) hours as needed for mild pain.    Marland Kitchen amLODipine (NORVASC) 10 MG tablet PLACE 1 TABLET (10 MG TOTAL) INTO FEEDING TUBE DAILY. 30 tablet 0  . glycopyrrolate (ROBINUL) 1 MG tablet PLACE 2 TABLETS (2 MG TOTAL) INTO FEEDING TUBE TWO TIMES DAILY. (Patient not taking: Reported on 07/31/2020) 60 tablet 0  . hydrALAZINE (APRESOLINE) 50 MG tablet Place 1 tablet (50 mg total) into feeding tube every 8 (eight) hours. 90 tablet 0  . labetalol (NORMODYNE) 300 MG tablet Place 1 tablet (300 mg total) into feeding tube 3 (three) times daily. 90 tablet 0  . levETIRAcetam (KEPPRA) 100 MG/ML solution Place 7.5 mLs (750 mg total) into feeding tube 2 (two) times daily. 350 mL 6  . levothyroxine (SYNTHROID) 100 MCG tablet Place 1 tablet (100 mcg total) into feeding tube daily at 6 (six) AM. 30 tablet 3  . metoCLOPramide (REGLAN) 5 MG tablet Place 1 tablet (5 mg total) into feeding tube 3 (three) times daily. 90 tablet 12  . Nutritional Supplements (FEEDING SUPPLEMENT, OSMOLITE 1.2 CAL,) LIQD Place 355 mLs into feeding tube 4 (four) times daily -  with meals and at bedtime.  0  . Nutritional Supplements (FEEDING SUPPLEMENT, PROSOURCE TF,) liquid Place 45 mLs into feeding tube 2 (two) times daily.    . QUEtiapine (SEROQUEL) 50 MG tablet PLACE 1  TABLET (50 MG TOTAL) INTO FEEDING TUBE 2 (TWO) TIMES DAILY. 60 tablet 3  . scopolamine (TRANSDERM-SCOP) 1 MG/3DAYS PLACE 1 PATCH (1.5 MG TOTAL) ONTO THE SKIN EVERY 3 (THREE) DAYS. 10 patch 12  . Water For Irrigation, Sterile (FREE WATER) SOLN Place 200 mLs into feeding tube every 6 (six) hours.     No current facility-administered medications on file prior to visit.    No Known Allergies  Social History   Socioeconomic History  . Marital status: Unknown    Spouse name: Not on file  . Number of children: Not on file  . Years of education: Not on file  . Highest education level: Not on file  Occupational History  . Not on file  Tobacco Use  . Smoking status: Former Research scientist (life sciences)  . Smokeless tobacco: Never Used  Vaping Use  . Vaping Use: Never used  Substance and Sexual Activity  . Alcohol use: Not Currently  . Drug use: Not Currently  . Sexual activity:  Not on file  Other Topics Concern  . Not on file  Social History Narrative  . Not on file   Social Determinants of Health   Financial Resource Strain: Not on file  Food Insecurity: Not on file  Transportation Needs: Not on file  Physical Activity: Not on file  Stress: Not on file  Social Connections: Not on file  Intimate Partner Violence: Not on file    No family history on file.  Past Surgical History:  Procedure Laterality Date  . BURR HOLE N/A 03/18/2020   Procedure: Haskell Flirt;  Surgeon: [name], MD;  Location: [place];  Service: Neurosurgery;  Laterality: N/A;  . CRANIOTOMY Left 01/30/2020   Procedure: LEFT FAR LATERAL CRANIOTOMY FOR ANEURYSM CLIPPING;  Surgeon: [name], MD;  Location: [facility];  Service: Neurosurgery;  Laterality: Left;  . DIRECT LARYNGOSCOPY N/A 02/29/2020   Procedure: DIRECT LARYNGOSCOPY;  Surgeon: [name], MD;  Location: [place];  Service: ENT;  Laterality: N/A;  . IR ANGIO INTRA EXTRACRAN SEL INTERNAL CAROTID BILAT MOD SED  01/30/2020  . IR ANGIO VERTEBRAL SEL VERTEBRAL UNI L  MOD SED  01/30/2020  . IR CM INJ ANY COLONIC TUBE W/FLUORO  07/03/2020  . IR GASTROSTOMY TUBE MOD SED  02/22/2020  . IR Leon TUBE PERCUT W/FLUORO  07/03/2020  . LAPAROSCOPIC REVISION VENTRICULAR-PERITONEAL (V-P) SHUNT N/A 03/18/2020   Procedure: LAPAROSCOPIC REVISION VENTRICULAR-PERITONEAL (V-P) SHUNT;  Surgeon: [name], MD;  Location: [place];  Service: General;  Laterality: N/A;  . RADIOLOGY WITH ANESTHESIA N/A 01/30/2020   Procedure: IR WITH ANESTHESIA;  Surgeon: [name], MD;  Location: [place];  Service: Radiology;  Laterality: N/A;  . THYROIDECTOMY N/A 02/08/2020   Procedure: THYROIDECTOMY;  Surgeon: [name], MD;  Location: [place];  Service: ENT;  Laterality: N/A;  . TRACHEOSTOMY TUBE PLACEMENT N/A 02/08/2020   Procedure: TRACHEOSTOMY;  Surgeon: [name], MD;  Location: [place];  Service: ENT;  Laterality: N/A;  . TRACHEOSTOMY TUBE PLACEMENT N/A 02/29/2020   Procedure: TRACHEOSTOMY EXCHANGE;  Surgeon: [name], MD;  Location: [place];  Service: ENT;  Laterality: N/A;  . VENTRICULOPERITONEAL SHUNT N/A 03/18/2020   Procedure: RIGHT SHUNT INSERTION VENTRICULAR-PERITONEAL/ BURR HOLE Evacuation of Subdural Hematoma;  Surgeon: [name], MD;  Location: [place];  Service: Neurosurgery;  Laterality: N/A;    ROS: Review of Systems Negative except as stated above  PHYSICAL EXAM: BP 122/74   Pulse 70   Resp 16   Wt 128 lb (58.1 kg)   SpO2 95%   BMI 20.66 kg/m   Wt Readings from Last 3 Encounters:  08/08/20 128 lb (58.1 kg)  06/05/20 131 lb 3.2 oz (59.5 kg)  05/26/20 131 lb 2.8 oz (59.5 kg)    Physical Exam  General appearance -patient is alert.  She is unable to make clear speech but localizes with mumbles  mental status -patient is alert.  She appears oriented and understands the conversation going on. Neck -trach in place.  Trach site appears clean. Chest -some harsh upper airway sounds.  No wheezes or crackles heard  heart - normal rate,  regular rhythm, normal S1, S2, no murmurs, rubs, clicks or gallops Neurological -grip 5/5 right, 4/5 left.  Power upper extremities proximally and distally 5/5 bilaterally.  Power lower extremities 4/5 bilaterally.  Gait observed with her using her Rollator walker.  Gait is slow with poor foot to floor clearance. Extremities -no lower extremity edema   GAD 7 : Generalized Anxiety Score  06/05/2020  Nervous, Anxious, on Edge 0  Control/stop worrying 0  Worry too much - different things 0  Trouble relaxing 1  Restless 3  Easily annoyed or irritable 2  Afraid - awful might happen 0  Total GAD 7 Score 6     CMP Latest Ref Rng & Units 07/30/2020 05/20/2020 05/11/2020  Glucose 70 - 99 mg/dL 678(L) 381(O) 175(Z)  BUN 8 - 23 mg/dL 02(H) 85(I) 77(O)  Creatinine 0.44 - 1.00 mg/dL 2.42 3.53 6.14  Sodium 135 - 145 mmol/L 144 139 139  Potassium 3.5 - 5.1 mmol/L 4.0 4.7 4.3  Chloride 98 - 111 mmol/L 107 99 103  CO2 22 - 32 mmol/L 28 31 28   Calcium 8.9 - 10.3 mg/dL 9.6 9.9  Total Protein 6.5 - 8.1 g/dL 8.0 7.5 -  Total Bilirubin 0.3 - 1.2 mg/dL 43.1) 0.4 -  Alkaline Phos 38 - 126 U/L 64 61 -  AST 15 - 41 U/L 26 19 -  ALT 0 - 44 U/L 29 15 -   Lipid Panel     Component Value Date/Time   TRIG 109 02/04/2020 0732    CBC    Component Value Date/Time   WBC 8.1 07/30/2020 2154   RBC 4.54 07/30/2020 2154   HGB 11.7 (L) 07/30/2020 2154   HCT 38.4 07/30/2020 2154   PLT 138 (L) 07/30/2020 2154   MCV 84.6 07/30/2020 2154   MCH 25.8 (L) 07/30/2020 2154   MCHC 30.5 07/30/2020 2154   RDW 14.2 07/30/2020 2154   LYMPHSABS 1.2 07/30/2020 2154   MONOABS 0.3 07/30/2020 2154   EOSABS 0.0 07/30/2020 2154   BASOSABS 0.0 07/30/2020 2154    ASSESSMENT AND PLAN: 1. Generalized seizure disorder (HCC) Continue Keppra.  Check level today.  We will get her established with a neurologist. - Ambulatory referral to Neurology - Levetiracetam level  2. Adjustment insomnia 3. Generalized anxiety  disorder Good sleep hygiene discussed. We will put her back on a low-dose of clonazepam to take at bedtime to help decrease the anxiety and help with sleep.  Went over the fact that it is a controlled substance that can cause respiratory suppression if too much is taken.  Daughter advised to monitor her respiratory status.  Nonopiate controlled substance prescribing agreement reviewed and signed. - clonazePAM (KLONOPIN) 0.5 MG tablet; Take 1 tablet (0.5 mg total) by mouth at bedtime as needed for anxiety.  Dispense: 30 tablet; Refill: 1  4. History of hemorrhagic cerebrovascular accident (CVA) with residual deficit 5. Gait disturbance, post-stroke Will have a CMA put her in for [facility] services  6. Tracheostomy status (HCC) Doing much better with less secretions now that she is on scopolamine and a PPI.  7. Need for Tdap vaccination Given.  8. Normocytic anemia - CBC - Iron, TIBC and Ferritin Panel  9. Thrombocytopenia (HCC) Recheck CBC today.  We will continue to monitor.    Patient was given the opportunity to ask questions.  Patient verbalized understanding of the plan and was able to repeat key elements of the plan.   No orders of the defined types were placed in this encounter.    Requested Prescriptions    No prescriptions requested or ordered in this encounter    No follow-ups on file.  [facility], MD, FACP

## 2020-08-09 MED ORDER — HYDRALAZINE HCL 50 MG PO TABS
50.0000 mg | ORAL_TABLET | Freq: Three times a day (TID) | ORAL | 6 refills | Status: DC
  Filled 2020-08-09: qty 90, 30d supply, fill #0

## 2020-08-10 ENCOUNTER — Other Ambulatory Visit: Payer: Self-pay

## 2020-08-10 ENCOUNTER — Other Ambulatory Visit: Payer: Self-pay | Admitting: Critical Care Medicine

## 2020-08-10 MED ORDER — AMLODIPINE BESYLATE 10 MG PO TABS
ORAL_TABLET | ORAL | 1 refills | Status: DC
  Filled 2020-08-10: qty 90, fill #0

## 2020-08-10 MED ORDER — LABETALOL HCL 300 MG PO TABS
300.0000 mg | ORAL_TABLET | Freq: Three times a day (TID) | ORAL | 6 refills | Status: DC
  Filled 2020-08-10: qty 90, 30d supply, fill #0

## 2020-08-10 NOTE — Telephone Encounter (Signed)
Requested Prescriptions  Pending Prescriptions Disp Refills  . amLODipine (NORVASC) 10 MG tablet 90 tablet 1    Sig: PLACE 1 TABLET (10 MG TOTAL) INTO FEEDING TUBE DAILY.     Cardiovascular:  Calcium Channel Blockers Passed - 08/10/2020  3:08 PM      Passed - Last BP in normal range    BP Readings from Last 1 Encounters:  08/08/20 122/74         Passed - Valid encounter within last 6 months    Recent Outpatient Visits          2 days ago Generalized seizure disorder [facility])   Turner [name], MD   2 months ago Establishing care with new doctor, encounter for   [place], MD      Future Appointments            In 3 months Wynetta Emery Dalbert Batman, MD [place]

## 2020-08-11 ENCOUNTER — Telehealth: Payer: Self-pay

## 2020-08-11 ENCOUNTER — Other Ambulatory Visit: Payer: Self-pay

## 2020-08-11 NOTE — Telephone Encounter (Signed)
Call placed to patient's daughter, Darrol Jump regarding PCS services.  She said that her mother has been approved for disability but they are still waiting for medicaid approval.  She was not sure about the status of medicare but is aware that a medicaid application is pending. Explained to her when her mother receives approval for medicaid to please contact this office with the medicaid number and a PCS application can be submitted at that time.  Medicaid is needed for [facility] services

## 2020-08-12 ENCOUNTER — Other Ambulatory Visit: Payer: Self-pay

## 2020-08-12 LAB — CBC
Hematocrit: 36.3 % (ref 34.0–46.6)
Hemoglobin: 11.7 g/dL (ref 11.1–15.9)
MCH: 25.9 pg — ABNORMAL LOW (ref 26.6–33.0)
MCHC: 32.2 g/dL (ref 31.5–35.7)
MCV: 80 fL (ref 79–97)
Platelets: 149 10*3/uL — ABNORMAL LOW (ref 150–450)
RBC: 4.52 x10E6/uL (ref 3.77–5.28)
RDW: 14 % (ref 11.7–15.4)
WBC: 4.4 10*3/uL (ref 3.4–10.8)

## 2020-08-12 LAB — IRON,TIBC AND FERRITIN PANEL
Ferritin: 134 ng/mL (ref 15–150)
Iron Saturation: 28 % (ref 15–55)
Iron: 71 ug/dL (ref 27–139)
Total Iron Binding Capacity: 258 ug/dL (ref 250–450)
UIBC: 187 ug/dL (ref 118–369)

## 2020-08-12 LAB — LEVETIRACETAM LEVEL: Levetiracetam Lvl: 46.2 ug/mL — ABNORMAL HIGH (ref 10.0–40.0)

## 2020-08-13 ENCOUNTER — Other Ambulatory Visit: Payer: Self-pay | Admitting: Internal Medicine

## 2020-08-13 ENCOUNTER — Encounter: Payer: Self-pay | Admitting: Internal Medicine

## 2020-08-13 ENCOUNTER — Other Ambulatory Visit: Payer: Self-pay

## 2020-08-18 ENCOUNTER — Telehealth (HOSPITAL_COMMUNITY): Payer: Self-pay

## 2020-08-18 ENCOUNTER — Other Ambulatory Visit: Payer: Self-pay | Admitting: Physician Assistant

## 2020-08-18 DIAGNOSIS — G910 Communicating hydrocephalus: Secondary | ICD-10-CM

## 2020-08-18 NOTE — Telephone Encounter (Signed)
Attempted to contact patient to schedule OP MBS - left voicemail. ?

## 2020-08-20 ENCOUNTER — Encounter: Payer: Self-pay | Admitting: Internal Medicine

## 2020-08-21 ENCOUNTER — Telehealth: Payer: Self-pay

## 2020-08-21 ENCOUNTER — Other Ambulatory Visit (HOSPITAL_COMMUNITY): Payer: Self-pay

## 2020-08-21 DIAGNOSIS — R1310 Dysphagia, unspecified: Secondary | ICD-10-CM

## 2020-08-21 NOTE — Telephone Encounter (Signed)
Call placed to patient's daughter, [name] and confirmed receipt of the medicaid number.  Also confirmed address in Epic as correct and explained that [facility] would be contacting her to do an assessment of her mother's needs.     PCS referral faxed to [facility]

## 2020-08-26 ENCOUNTER — Observation Stay (HOSPITAL_COMMUNITY): Payer: Medicaid Other

## 2020-08-26 ENCOUNTER — Emergency Department (HOSPITAL_COMMUNITY): Payer: Medicaid Other

## 2020-08-26 ENCOUNTER — Inpatient Hospital Stay (HOSPITAL_COMMUNITY)
Admission: EM | Admit: 2020-08-26 | Discharge: 2020-10-07 | DRG: 086 | Disposition: A | Discharge status: Skilled Nursing Facility | Payer: Medicaid Other | Attending: Family Medicine | Admitting: Family Medicine

## 2020-08-26 ENCOUNTER — Telehealth: Payer: Self-pay

## 2020-08-26 ENCOUNTER — Other Ambulatory Visit: Payer: Self-pay

## 2020-08-26 ENCOUNTER — Encounter (HOSPITAL_COMMUNITY): Payer: Self-pay

## 2020-08-26 DIAGNOSIS — Z933 Colostomy status: Secondary | ICD-10-CM

## 2020-08-26 DIAGNOSIS — F419 Anxiety disorder, unspecified: Secondary | ICD-10-CM | POA: Diagnosis present

## 2020-08-26 DIAGNOSIS — Y92008 Other place in unspecified non-institutional (private) residence as the place of occurrence of the external cause: Secondary | ICD-10-CM

## 2020-08-26 DIAGNOSIS — Z66 Do not resuscitate: Secondary | ICD-10-CM | POA: Diagnosis present

## 2020-08-26 DIAGNOSIS — S065X0A Traumatic subdural hemorrhage without loss of consciousness, initial encounter: Principal | ICD-10-CM | POA: Diagnosis present

## 2020-08-26 DIAGNOSIS — D6959 Other secondary thrombocytopenia: Secondary | ICD-10-CM | POA: Diagnosis present

## 2020-08-26 DIAGNOSIS — R739 Hyperglycemia, unspecified: Secondary | ICD-10-CM | POA: Diagnosis present

## 2020-08-26 DIAGNOSIS — K219 Gastro-esophageal reflux disease without esophagitis: Secondary | ICD-10-CM | POA: Diagnosis present

## 2020-08-26 DIAGNOSIS — S065XAA Traumatic subdural hemorrhage with loss of consciousness status unknown, initial encounter: Secondary | ICD-10-CM | POA: Diagnosis present

## 2020-08-26 DIAGNOSIS — I10 Essential (primary) hypertension: Secondary | ICD-10-CM

## 2020-08-26 DIAGNOSIS — Z930 Tracheostomy status: Secondary | ICD-10-CM

## 2020-08-26 DIAGNOSIS — Z781 Physical restraint status: Secondary | ICD-10-CM

## 2020-08-26 DIAGNOSIS — W108XXA Fall (on) (from) other stairs and steps, initial encounter: Secondary | ICD-10-CM | POA: Diagnosis not present

## 2020-08-26 DIAGNOSIS — I959 Hypotension, unspecified: Secondary | ICD-10-CM | POA: Diagnosis not present

## 2020-08-26 DIAGNOSIS — E890 Postprocedural hypothyroidism: Secondary | ICD-10-CM | POA: Diagnosis present

## 2020-08-26 DIAGNOSIS — S065X9A Traumatic subdural hemorrhage with loss of consciousness of unspecified duration, initial encounter: Secondary | ICD-10-CM | POA: Diagnosis not present

## 2020-08-26 DIAGNOSIS — Z931 Gastrostomy status: Secondary | ICD-10-CM

## 2020-08-26 DIAGNOSIS — E1165 Type 2 diabetes mellitus with hyperglycemia: Secondary | ICD-10-CM | POA: Diagnosis present

## 2020-08-26 DIAGNOSIS — D649 Anemia, unspecified: Secondary | ICD-10-CM | POA: Diagnosis present

## 2020-08-26 DIAGNOSIS — R4702 Dysphasia: Secondary | ICD-10-CM | POA: Diagnosis present

## 2020-08-26 DIAGNOSIS — R1310 Dysphagia, unspecified: Secondary | ICD-10-CM | POA: Diagnosis present

## 2020-08-26 DIAGNOSIS — Z79899 Other long term (current) drug therapy: Secondary | ICD-10-CM

## 2020-08-26 DIAGNOSIS — Z79890 Hormone replacement therapy (postmenopausal): Secondary | ICD-10-CM

## 2020-08-26 DIAGNOSIS — G8194 Hemiplegia, unspecified affecting left nondominant side: Secondary | ICD-10-CM | POA: Diagnosis present

## 2020-08-26 DIAGNOSIS — Z982 Presence of cerebrospinal fluid drainage device: Secondary | ICD-10-CM

## 2020-08-26 DIAGNOSIS — R29810 Facial weakness: Secondary | ICD-10-CM | POA: Diagnosis present

## 2020-08-26 DIAGNOSIS — F0150 Vascular dementia without behavioral disturbance: Secondary | ICD-10-CM | POA: Diagnosis present

## 2020-08-26 DIAGNOSIS — Z9181 History of falling: Secondary | ICD-10-CM

## 2020-08-26 DIAGNOSIS — Z20822 Contact with and (suspected) exposure to covid-19: Secondary | ICD-10-CM | POA: Diagnosis present

## 2020-08-26 DIAGNOSIS — I69891 Dysphagia following other cerebrovascular disease: Secondary | ICD-10-CM

## 2020-08-26 DIAGNOSIS — R29708 NIHSS score 8: Secondary | ICD-10-CM | POA: Diagnosis present

## 2020-08-26 DIAGNOSIS — G4089 Other seizures: Secondary | ICD-10-CM | POA: Diagnosis present

## 2020-08-26 DIAGNOSIS — I69020 Aphasia following nontraumatic subarachnoid hemorrhage: Secondary | ICD-10-CM

## 2020-08-26 DIAGNOSIS — D696 Thrombocytopenia, unspecified: Secondary | ICD-10-CM | POA: Diagnosis present

## 2020-08-26 DIAGNOSIS — W109XXA Fall (on) (from) unspecified stairs and steps, initial encounter: Secondary | ICD-10-CM | POA: Diagnosis present

## 2020-08-26 DIAGNOSIS — Z87891 Personal history of nicotine dependence: Secondary | ICD-10-CM

## 2020-08-26 DIAGNOSIS — I6200 Nontraumatic subdural hemorrhage, unspecified: Secondary | ICD-10-CM | POA: Diagnosis not present

## 2020-08-26 DIAGNOSIS — K5900 Constipation, unspecified: Secondary | ICD-10-CM | POA: Diagnosis not present

## 2020-08-26 HISTORY — DX: Aneurysm of unspecified site: I72.9

## 2020-08-26 HISTORY — DX: Traumatic subdural hemorrhage with loss of consciousness of unspecified duration, initial encounter: S06.5X9A

## 2020-08-26 HISTORY — DX: Gastrostomy status: Z93.1

## 2020-08-26 HISTORY — DX: Traumatic subdural hemorrhage with loss of consciousness status unknown, initial encounter: S06.5XAA

## 2020-08-26 HISTORY — DX: Tracheostomy status: Z93.0

## 2020-08-26 LAB — DIFFERENTIAL
Abs Immature Granulocytes: 0.01 10*3/uL (ref 0.00–0.07)
Basophils Absolute: 0 10*3/uL (ref 0.0–0.1)
Basophils Relative: 0 %
Eosinophils Absolute: 0.1 10*3/uL (ref 0.0–0.5)
Eosinophils Relative: 1 %
Immature Granulocytes: 0 %
Lymphocytes Relative: 23 %
Lymphs Abs: 1.3 10*3/uL (ref 0.7–4.0)
Monocytes Absolute: 0.3 10*3/uL (ref 0.1–1.0)
Monocytes Relative: 6 %
Neutro Abs: 4 10*3/uL (ref 1.7–7.7)
Neutrophils Relative %: 70 %

## 2020-08-26 LAB — CBC
HCT: 36.9 % (ref 36.0–46.0)
Hemoglobin: 11.4 g/dL — ABNORMAL LOW (ref 12.0–15.0)
MCH: 25.9 pg — ABNORMAL LOW (ref 26.0–34.0)
MCHC: 30.9 g/dL (ref 30.0–36.0)
MCV: 83.7 fL (ref 80.0–100.0)
Platelets: 162 10*3/uL (ref 150–400)
RBC: 4.41 MIL/uL (ref 3.87–5.11)
RDW: 14 % (ref 11.5–15.5)
WBC: 5.7 10*3/uL (ref 4.0–10.5)
nRBC: 0 % (ref 0.0–0.2)

## 2020-08-26 LAB — COMPREHENSIVE METABOLIC PANEL
ALT: 23 U/L (ref 0–44)
AST: 31 U/L (ref 15–41)
Albumin: 3.7 g/dL (ref 3.5–5.0)
Alkaline Phosphatase: 61 U/L (ref 38–126)
Anion gap: 7 (ref 5–15)
BUN: 16 mg/dL (ref 8–23)
CO2: 29 mmol/L (ref 22–32)
Calcium: 9.6 mg/dL (ref 8.9–10.3)
Chloride: 101 mmol/L (ref 98–111)
Creatinine, Ser: 0.72 mg/dL (ref 0.44–1.00)
GFR, Estimated: >60 mL/min (ref >60)
Glucose, Bld: 201 mg/dL — ABNORMAL HIGH (ref 70–99)
Potassium: 4.5 mmol/L (ref 3.5–5.1)
Sodium: 137 mmol/L (ref 135–145)
Total Bilirubin: 0.5 mg/dL (ref 0.3–1.2)
Total Protein: 6.9 g/dL (ref 6.5–8.1)

## 2020-08-26 LAB — APTT: aPTT: 24 seconds (ref 24–36)

## 2020-08-26 LAB — I-STAT CHEM 8, ED
BUN: 22 mg/dL (ref 8–23)
Calcium, Ion: 1.19 mmol/L (ref 1.15–1.40)
Chloride: 102 mmol/L (ref 98–111)
Creatinine, Ser: 0.5 mg/dL (ref 0.44–1.00)
Glucose, Bld: 194 mg/dL — ABNORMAL HIGH (ref 70–99)
HCT: 35 % — ABNORMAL LOW (ref 36.0–46.0)
Hemoglobin: 11.9 g/dL — ABNORMAL LOW (ref 12.0–15.0)
Potassium: 4.6 mmol/L (ref 3.5–5.1)
Sodium: 141 mmol/L (ref 135–145)
TCO2: 32 mmol/L (ref 22–32)

## 2020-08-26 LAB — PROTIME-INR
INR: 1 (ref 0.8–1.2)
Prothrombin Time: 13.1 seconds (ref 11.4–15.2)

## 2020-08-26 LAB — RESP PANEL BY RT-PCR (FLU A&B, COVID) ARPGX2
Influenza A by PCR: NEGATIVE
Influenza B by PCR: NEGATIVE
SARS Coronavirus 2 by RT PCR: NEGATIVE

## 2020-08-26 IMAGING — DX DG HUMERUS 2V *R*
1 series · 2 of 2 positions shown · non-contrast
Comparison: None.

CLINICAL DATA: Right arm pain after fall.

EXAM:
RIGHT HUMERUS - 2+ VIEW

[Series 1: humerus · 0.14mm/px · 2 of 2 slices shown]
[im 1/2]
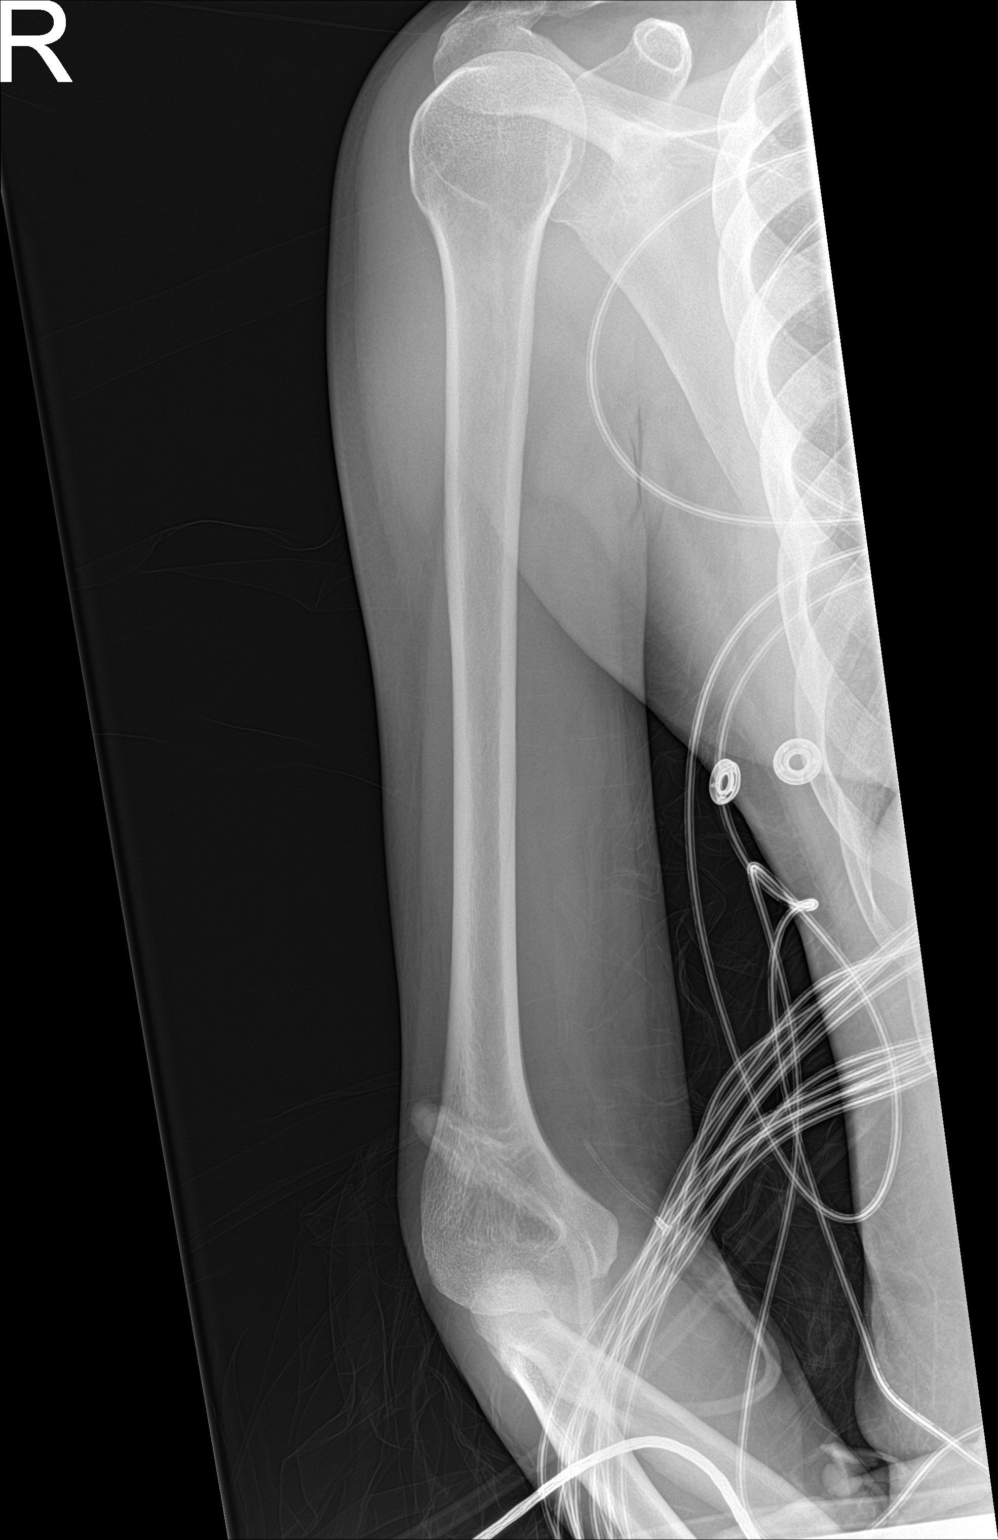
[im 2/2]
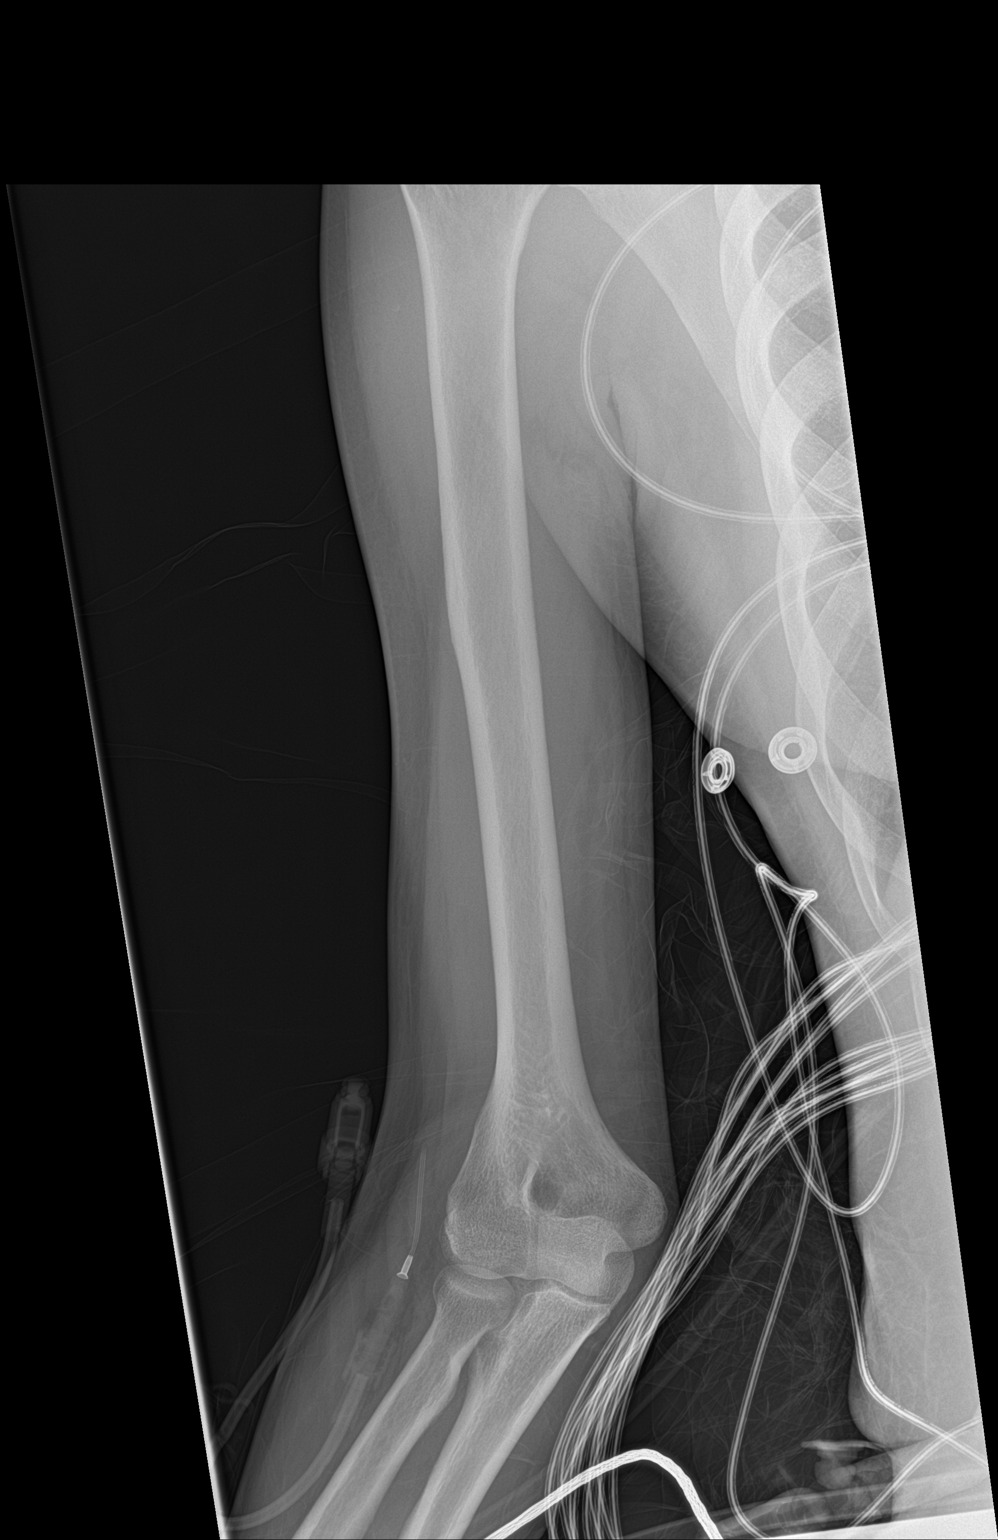

[2 of 2 positions shown; findings below may reference images not displayed]

FINDINGS: There is no evidence of fracture or other focal bone lesions. Soft
tissues are unremarkable.
IMPRESSION: Negative.

## 2020-08-26 IMAGING — DX DG HAND 2V*R*
1 series · 2 of 2 positions shown · non-contrast
Comparison: None.

CLINICAL DATA: Right hand pain after fall.

EXAM:
RIGHT HAND - 2 VIEW

[Series 1: hand · 0.14mm/px · 2 of 2 slices shown]
[im 1/2]
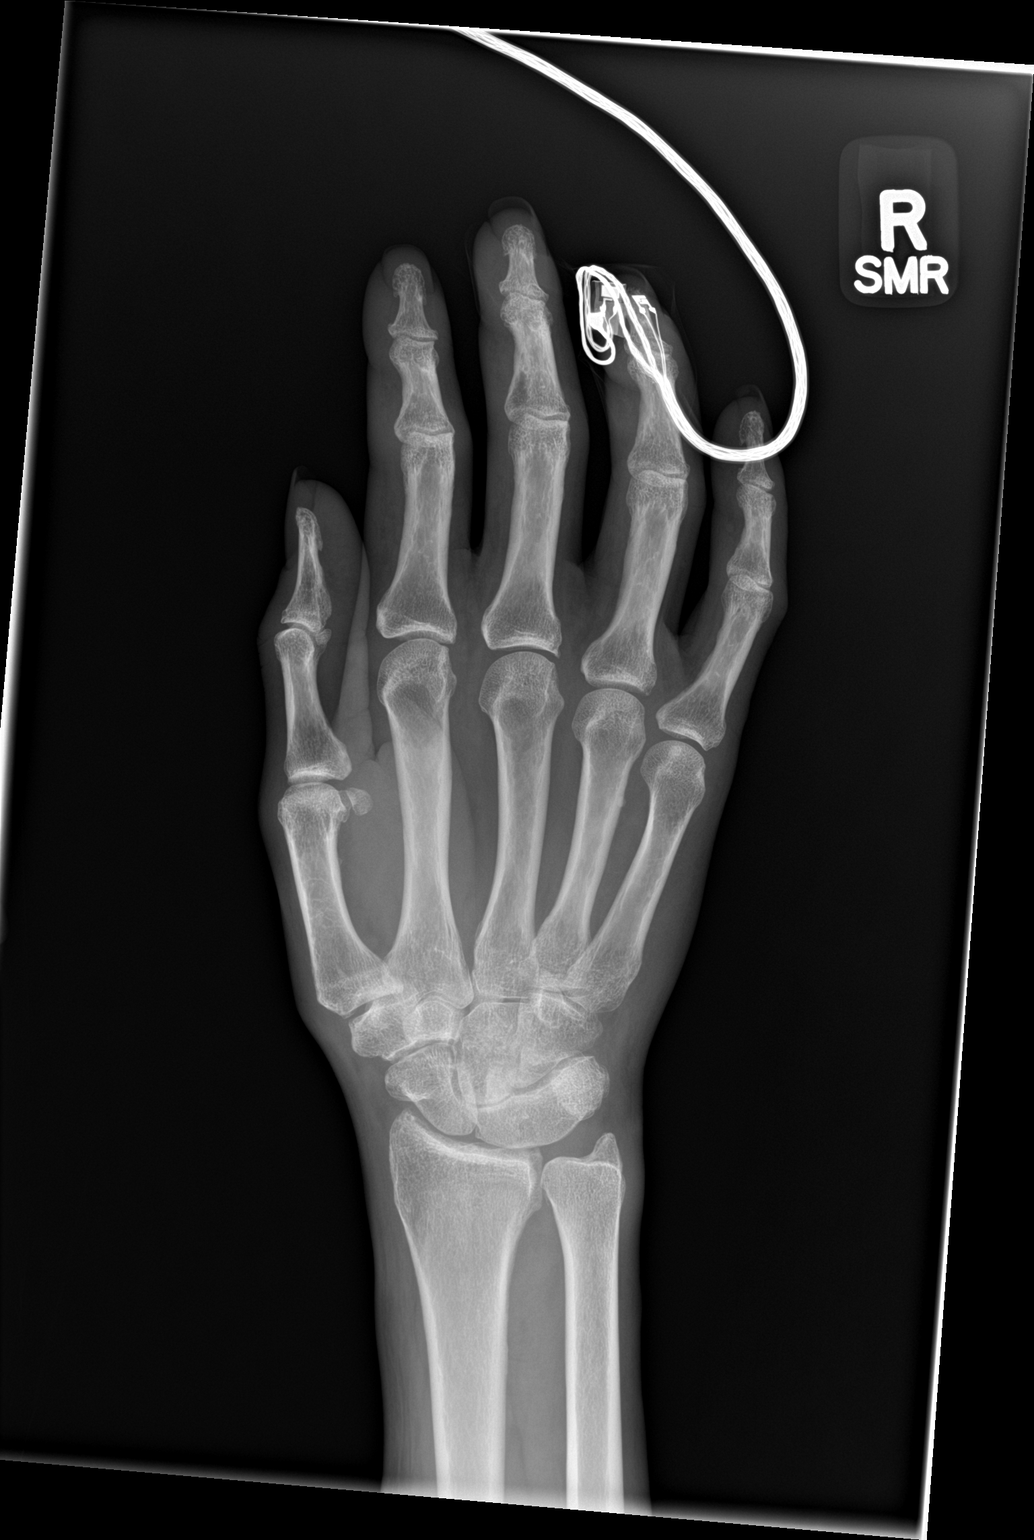
[im 2/2]
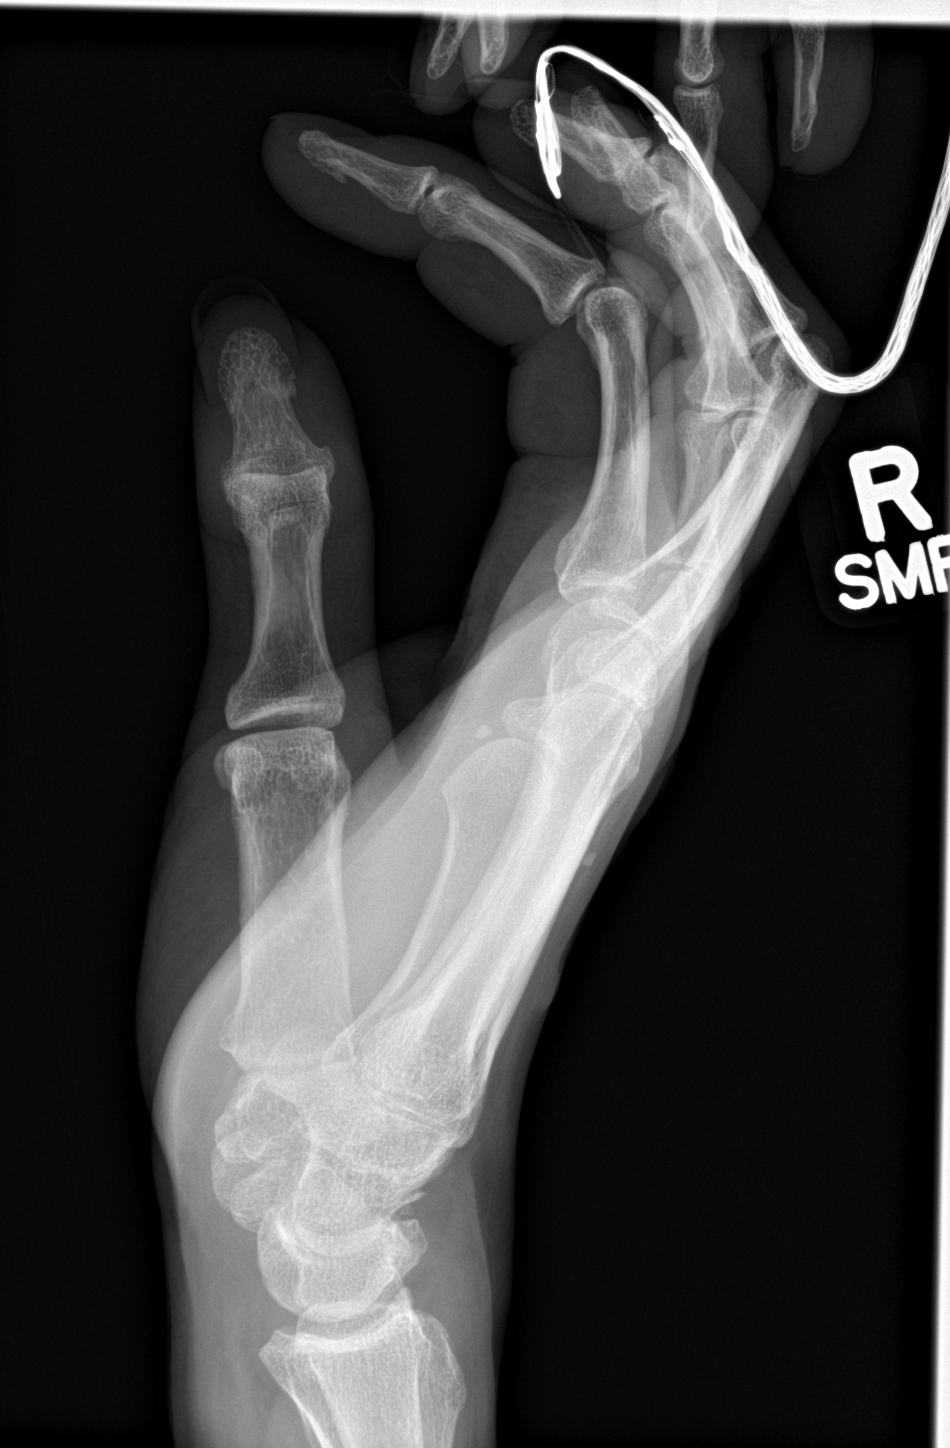

[2 of 2 positions shown; findings below may reference images not displayed]

FINDINGS: There is no evidence of fracture or dislocation. There is no
evidence of arthropathy or other focal bone abnormality. Soft
tissues are unremarkable.
IMPRESSION: Negative.

## 2020-08-26 IMAGING — MR MR HEAD W/O CM
10 of 12 series · 38 of 48 positions shown · non-contrast
Comparison: None.

CLINICAL DATA: Intracranial hemorrhage. History of ruptured
aneurysm. Fall.

EXAM:
MRI HEAD WITHOUT CONTRAST
TECHNIQUE: Multiplanar, multiecho pulse sequences of the brain and surrounding
structures were obtained without intravenous contrast.

[Series 5: DWI · axial · 3.0mm · 0.88mm/px · z∈[-73,+78]mm · 7 of 100 slices shown (1 of 4)]
[im 1/100]
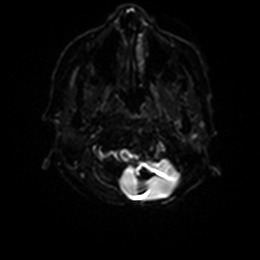
[im 17/100]
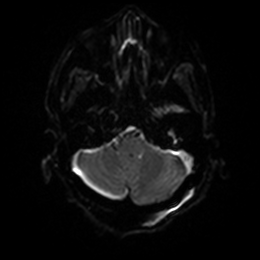
[im 34/100]
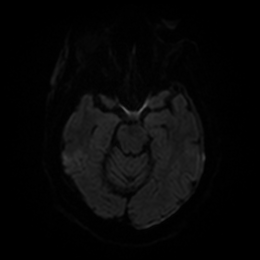
[im 50/100]
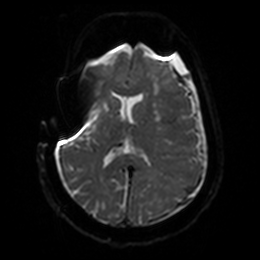
[im 67/100]
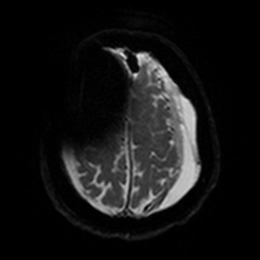
[im 83/100]
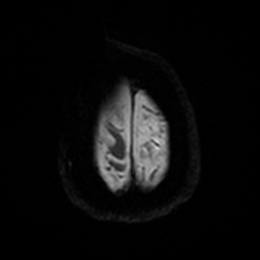
[im 100/100]
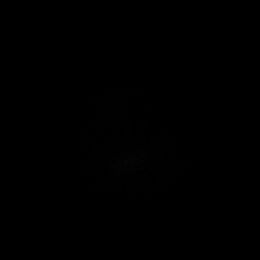

[Series 6: DWI · axial · 3.0mm · 0.88mm/px · z∈[-73,+78]mm · 4 of 51 slices shown (2 of 4)]
[im 1/51]
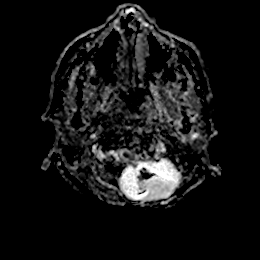
[im 17/51]
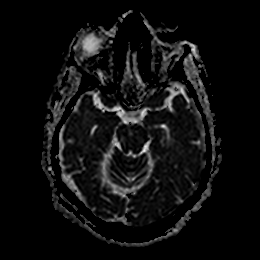
[im 34/51]
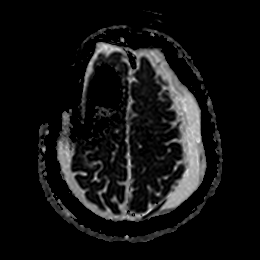
[im 51/51]
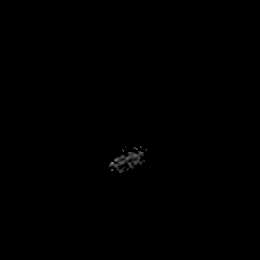

[Series 7: DWI · coronal · 4.0mm · 0.88mm/px · 6 of 76 slices shown (3 of 4)]
[im 1/76]
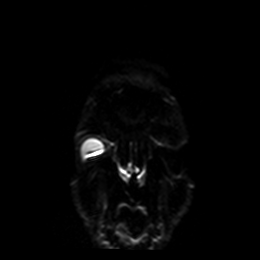
[im 16/76]
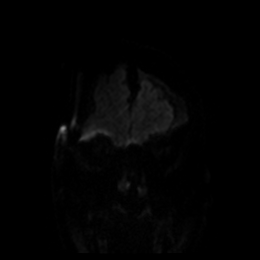
[im 31/76]
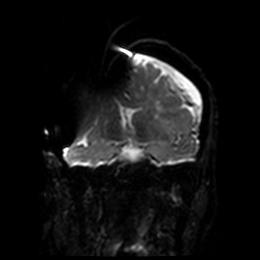
[im 46/76]
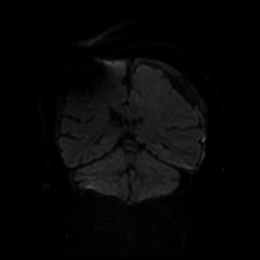
[im 61/76]
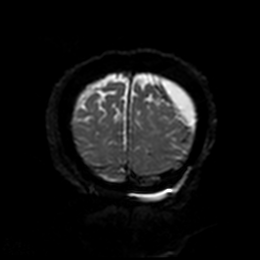
[im 76/76]
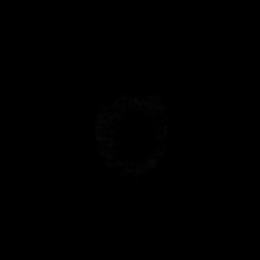

[Series 8: DWI · coronal · 4.0mm · 0.88mm/px · 3 of 36 slices shown (4 of 4)]
[im 1/36]
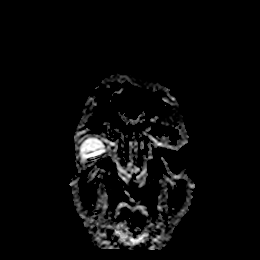
[im 18/36]
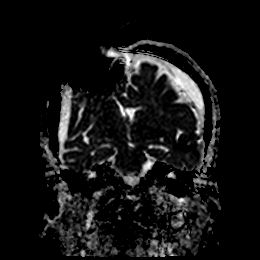
[im 36/36]
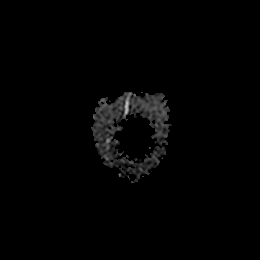

[Series 9: FLAIR · axial · 5.0mm · 0.45mm/px · z∈[-71,+71]mm · 2 of 25 slices shown]
[im 1/25]
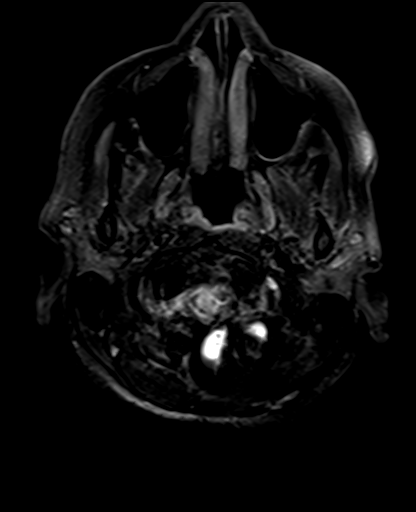
[im 25/25]
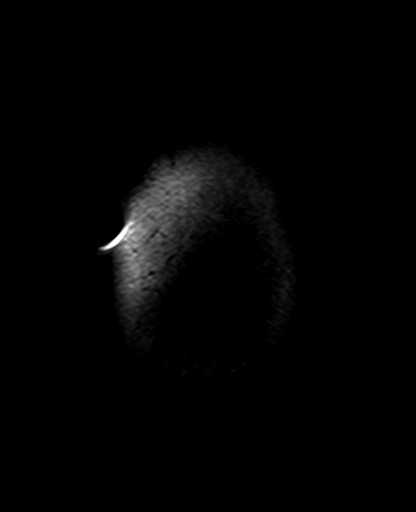

[Series 11: pha_images · axial · 3.0mm · 0.90mm/px · z∈[-80,+82]mm · 4 of 54 slices shown]
[im 1/54]
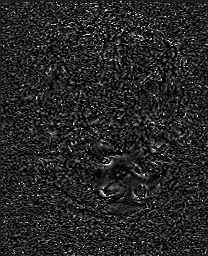
[im 18/54]
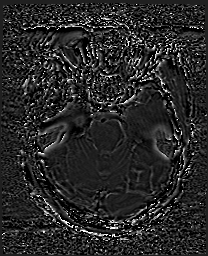
[im 36/54]
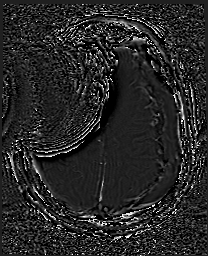
[im 54/54]
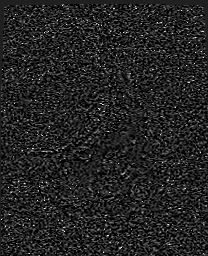

[Series 12: swi_images · axial · 3.0mm · 0.90mm/px · z∈[-80,+82]mm · 5 of 56 slices shown]
[im 1/56]
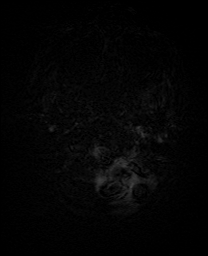
[im 14/56]
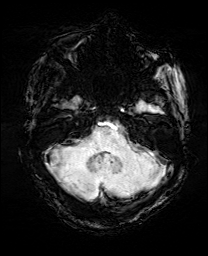
[im 28/56]
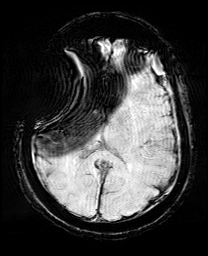
[im 42/56]
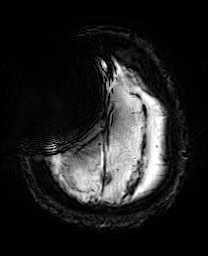
[im 56/56]
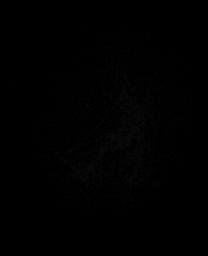

[Series 14: T2 · axial · 5.0mm · 0.72mm/px · z∈[-72,+69]mm · 2 of 25 slices shown (1 of 2)]
[im 1/25]
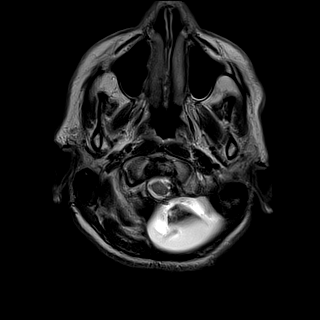
[im 25/25]
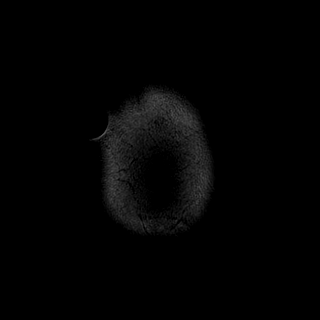

[Series 15: T1 · sagittal · 5.0mm · 0.75mm/px · 2 of 25 slices shown]
[im 1/25]
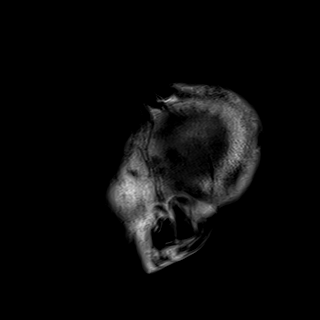
[im 25/25]
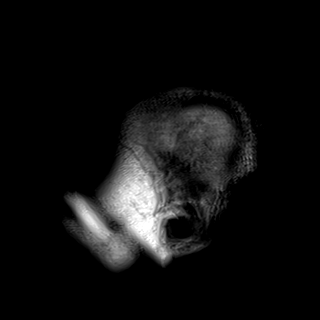

[Series 22: T2 · coronal · 5.0mm · 0.43mm/px · 3 of 31 slices shown (2 of 2)]
[im 1/31]
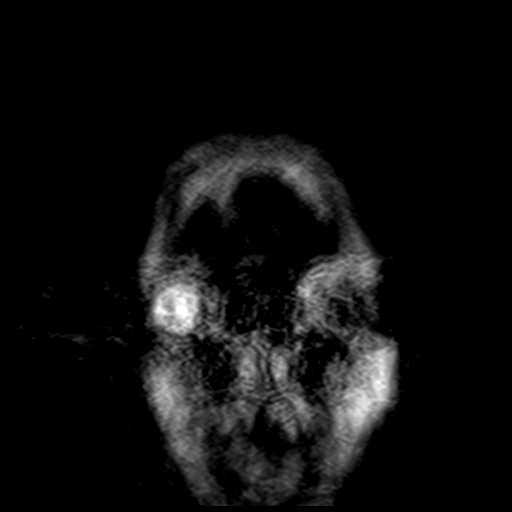
[im 16/31]
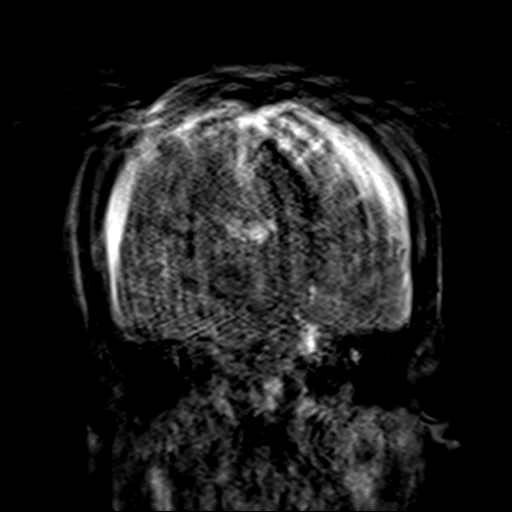
[im 31/31]
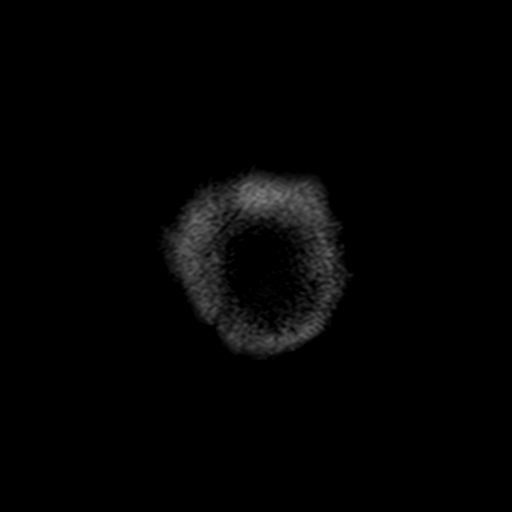

[38 of 48 positions shown; findings below may reference images not displayed]

FINDINGS: Examination degraded by motion and susceptibility effects related to
shunt device.

Brain: Bilateral subdural hematomas are unchanged, measuring
approximately 9 mm on the right and 16 mm on the left. Trace
rightward midline shift is unchanged. No hydrocephalus. Right
frontal approach shunt catheter tip is at the right foramen of
STODDARD. Multiple old small vessel infarcts. There is multifocal
periventricular white matter hyperintensity, most often a result of
chronic microvascular ischemia.

Vascular: Normal flow voids.

Skull and upper cervical spine: Normal marrow signal.

Sinuses/Orbits: Paranasal sinuses and mastoids are free of fluid.
Absent left lobe.

Other: None
IMPRESSION: 1. Motion degraded examination.
2. Unchanged bilateral subdural hematomas and trace rightward
midline shift.
3. Right frontal approach shunt catheter tip at the right foramen of
STODDARD. No hydrocephalus.
4. Multiple old small vessel infarcts and findings of chronic
microvascular ischemia.

## 2020-08-26 IMAGING — DX DG PORTABLE PELVIS
1 series · 1 of 1 positions shown · non-contrast
Comparison: None.

CLINICAL DATA: Fall.

EXAM:
PORTABLE PELVIS 1-2 VIEWS

[pelvis]
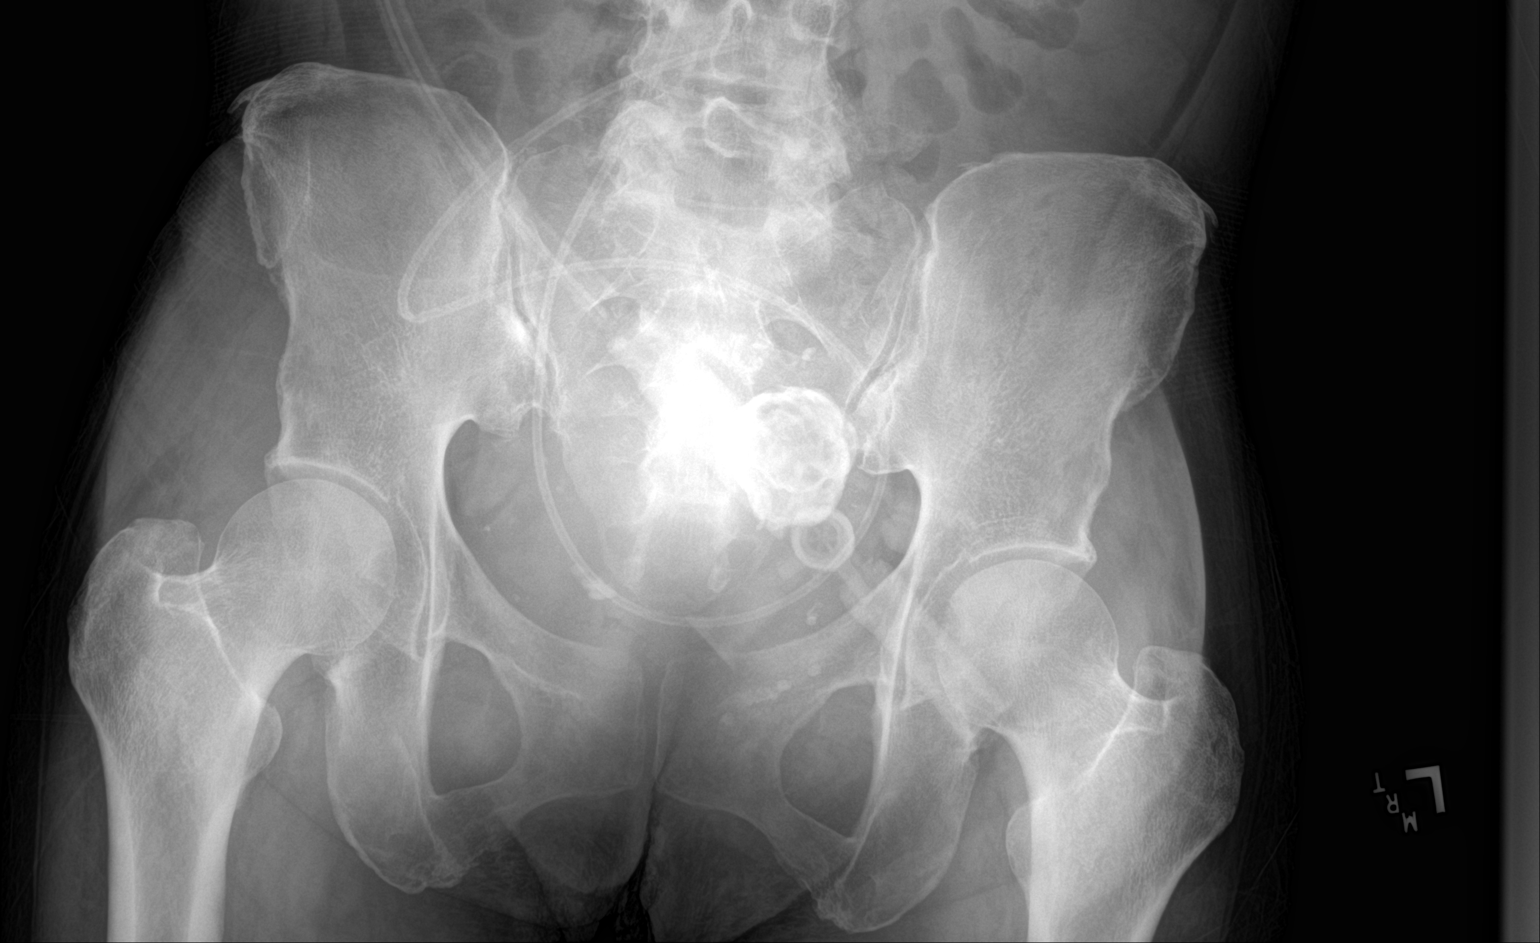

[1 of 1 positions shown; findings below may reference images not displayed]

FINDINGS: There is no evidence of pelvic fracture or diastasis. No pelvic bone
lesions are seen. Calcified uterine fibroid is noted.
IMPRESSION: No acute abnormality seen.

## 2020-08-26 IMAGING — CT CT HEAD CODE STROKE
2 of 3 series · 14 of 47 positions shown, 17 images · non-contrast
Comparison: Head CT [DATE] and earlier.

CLINICAL DATA: Code stroke. 65-year-old female with history of left
PICA aneurysm clipping, subarachnoid and subdural hemorrhage.

EXAM:
CT HEAD WITHOUT CONTRAST
TECHNIQUE: Contiguous axial images were obtained from the base of the skull
through the vertex without intravenous contrast.

[Series 2: head 5.0 st · axial · 0.45mm/px · z∈[-116,+19]mm · 11 of 33 slices shown, 14 images]
[im 3/33  brain]
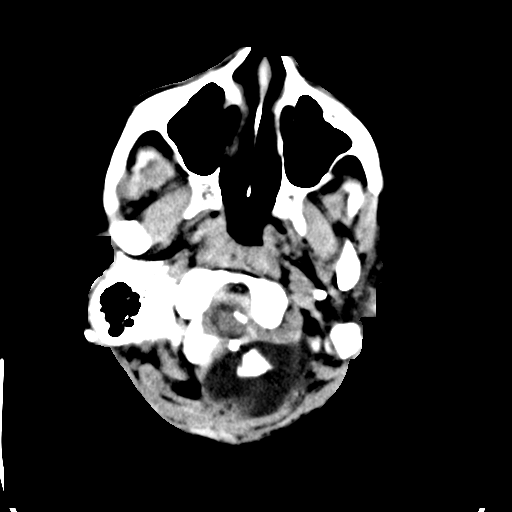
[im 3/33  bone]
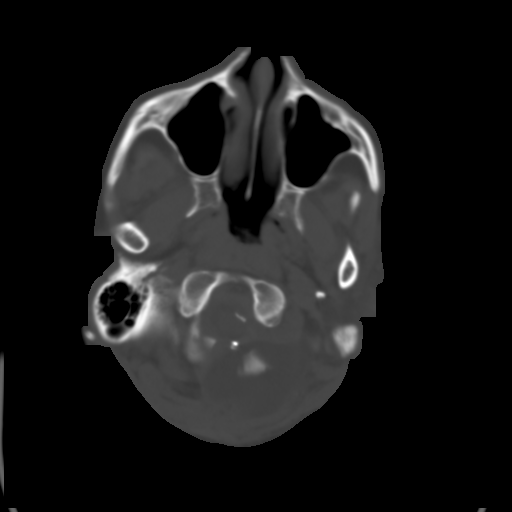
[im 5/33  brain]
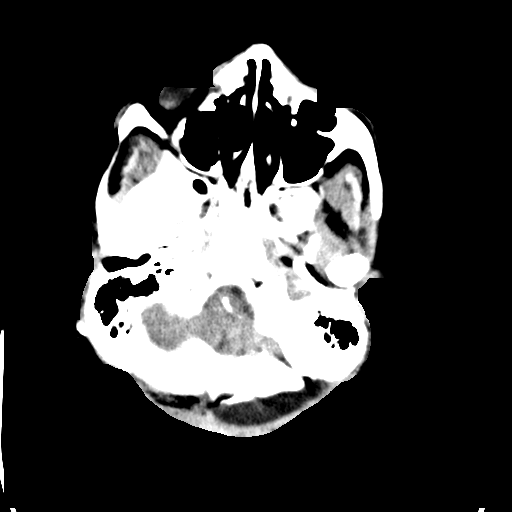
[im 8/33  brain]
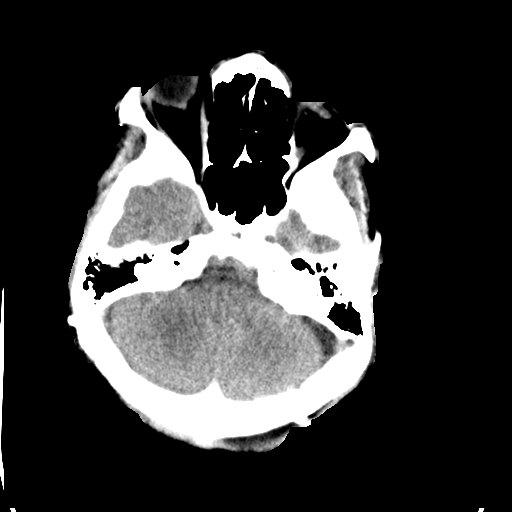
[im 10/33  brain]
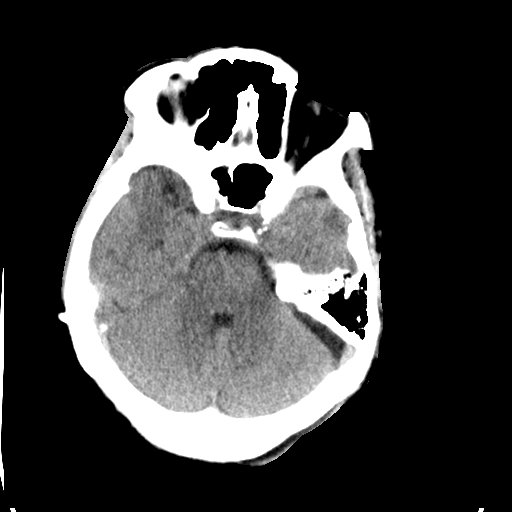
[im 14/33  brain]
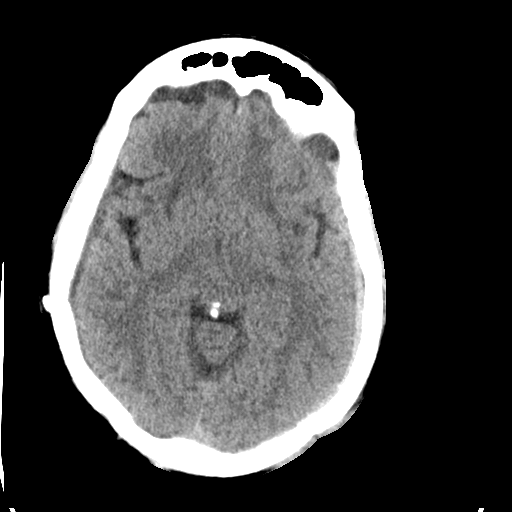
[im 14/33  bone]
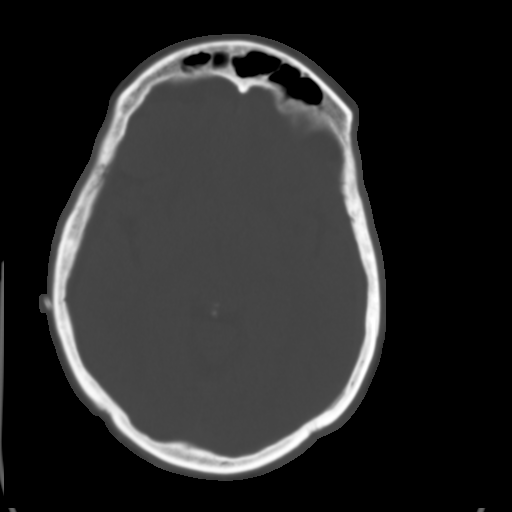
[im 17/33  brain]
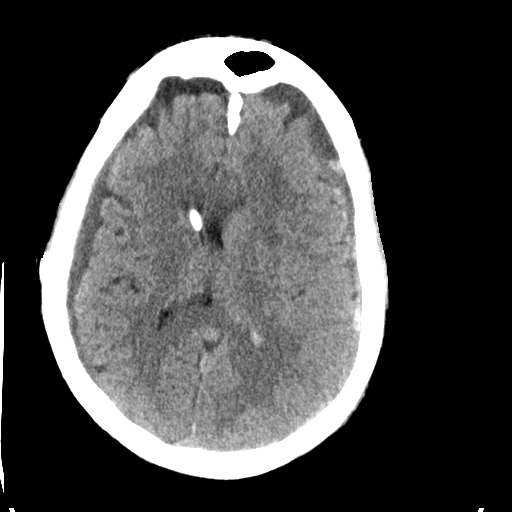
[im 19/33  brain]
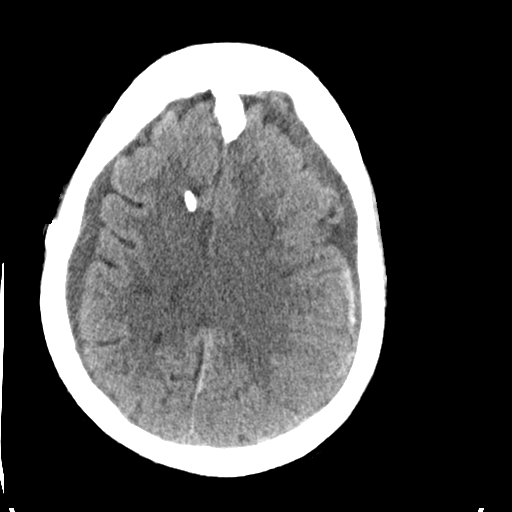
[im 23/33  brain]
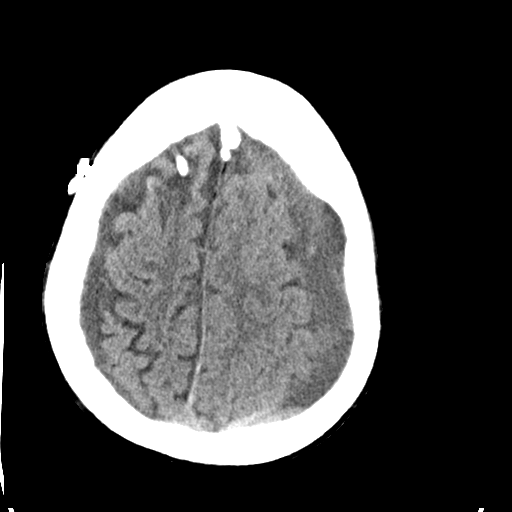
[im 25/33  brain]
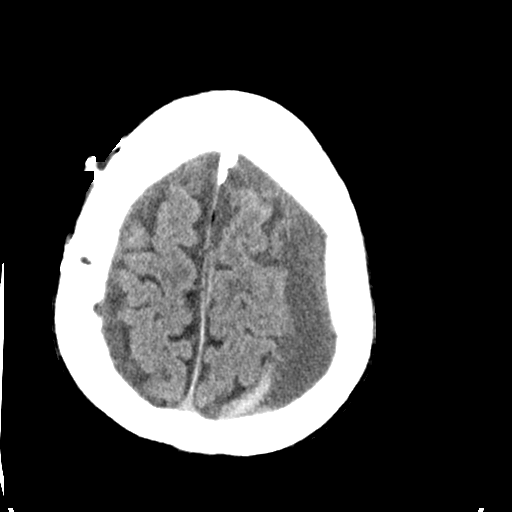
[im 25/33  bone]
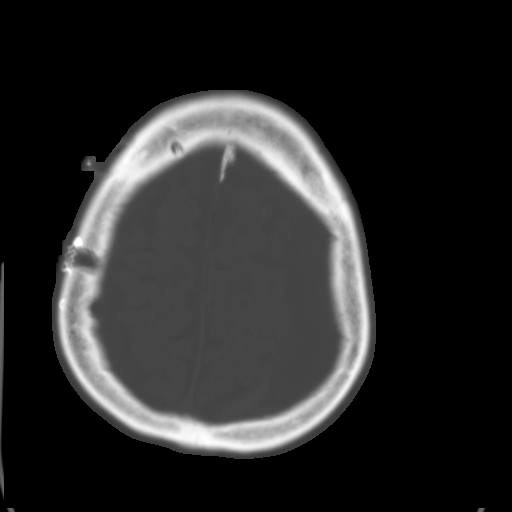
[im 28/33  brain]
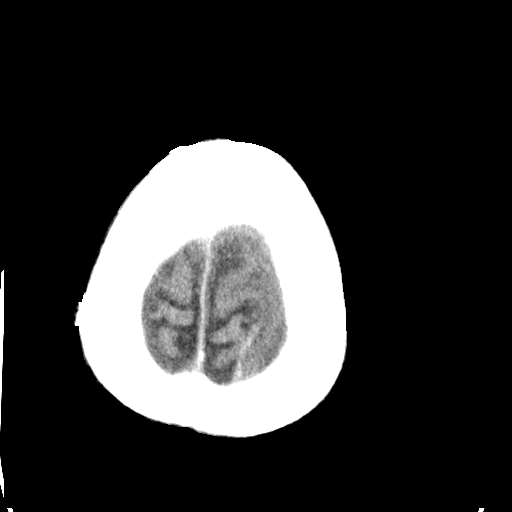
[im 30/33  brain]
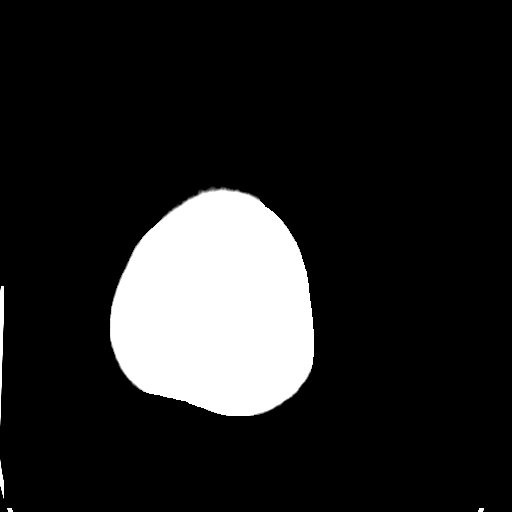

[Series 5: head 3.0 cor st · coronal · 0.32mm/px · 3 of 72 slices shown]
[im 24/72  brain]
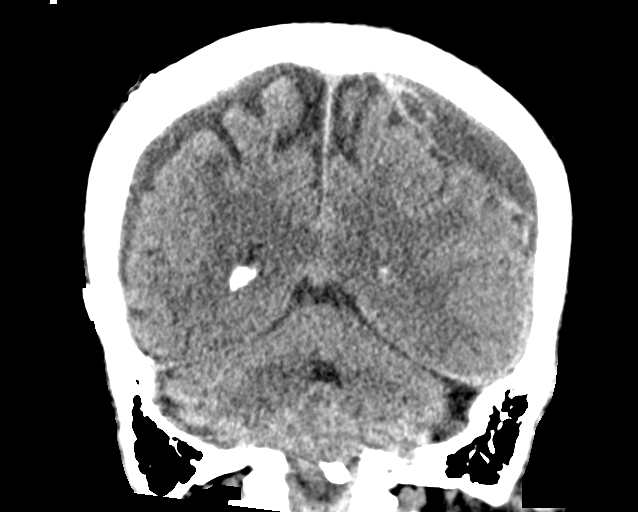
[im 32/72  brain]
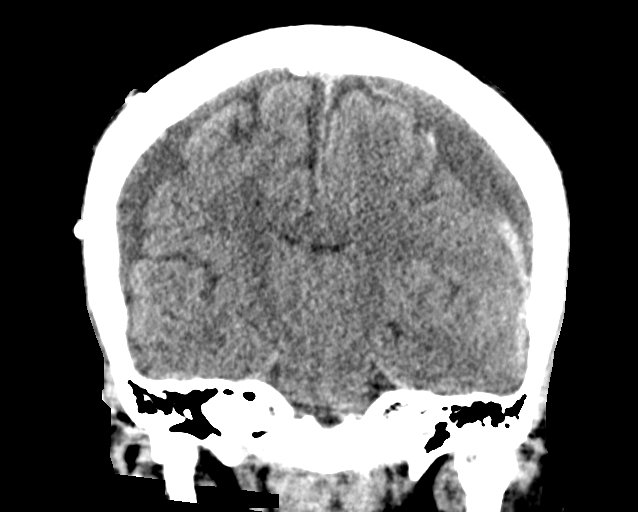
[im 40/72  brain]
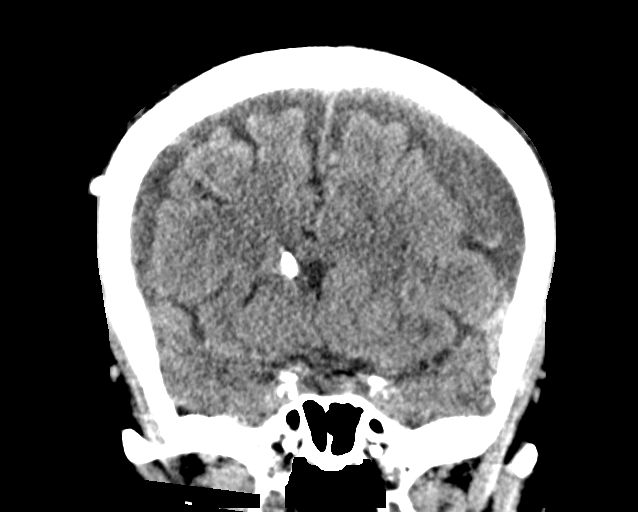

[14 of 47 positions shown; findings below may reference images not displayed]

FINDINGS: Brain: Persistent bilateral subdural hematomas, mixed density on the
left with mildly increased hyperdense blood products on that side
since [DATE] (series 2, image 16). Trace para falcine hyperdense
blood has also increased on image 21. The left-side subdural is
mildly lobulated and measures up to 12 mm thickness, with volume of
blood increased along the anterior left frontal convexity.

Stable mostly low-density right subdural volume, with hematoma on
that side measuring 9-10 mm in thickness.

Rightward midline shift has increased from 4 mm to 8 mm. Mass effect
on the ventricles has increased. A right superior frontal approach
ventriculostomy catheter appears stable and in communication with
the right lateral ventricle. No ventriculomegaly. Basilar cisterns
are stable.

No superimposed No cortically based acute infarct identified. Stable
gray-white matter differentiation throughout the brain. There is
some chronic right frontal lobe encephalomalacia.

Vascular: Mild Calcified atherosclerosis at the skull base. Aneurysm
clip at the left pre medullary cistern is stable.

Skull: Stable left suboccipital, foramen magnum craniotomy and
partial C1 ring resection. No acute osseous abnormality identified.

Sinuses/Orbits: Visualized paranasal sinuses and mastoids are stable
and well aerated.

Other: Retained secretions in the nasopharynx similar to the earlier
this month. Prior resection of the left globe. Negative right orbit.

Stable left suboccipital simple appearing fluid collection, possibly
post craniotomy pseudomeningocele. Stable right superior frontal
vertex shunt reservoir with tubing tracking to the posterior right
neck.

ASPECTS ([place] Stroke Program Early CT Score)

Total score (0-10 with 10 being normal): 10
IMPRESSION: 1. Acute on chronic left side subdural hematoma, increased since
[DATE] with new hyperdense blood products, and increased
intracranial mass effect with rightward midline shift of the
anterior septum pellucidum now 8 mm (previously 4 mm). Stable
smaller right side subdural hematoma.
2. No acute cortically based infarct identified.
3. Stable right superior frontal approach ventriculostomy catheter
with no ventriculomegaly. Stable postoperative changes including
small left suboccipital pseudomeningocele.
4. These results were communicated to Dr. NEJMAN at [DATE] on
[DATE] by text page via the [employer] messaging system.

## 2020-08-26 IMAGING — DX DG SHOULDER 2+V*R*
1 series · 2 of 2 positions shown · non-contrast
Comparison: None.

CLINICAL DATA: Right shoulder pain after fall today.

EXAM:
RIGHT SHOULDER - 2+ VIEW

[Series 1: shoulder · 0.14mm/px · 2 of 2 slices shown]
[im 1/2]
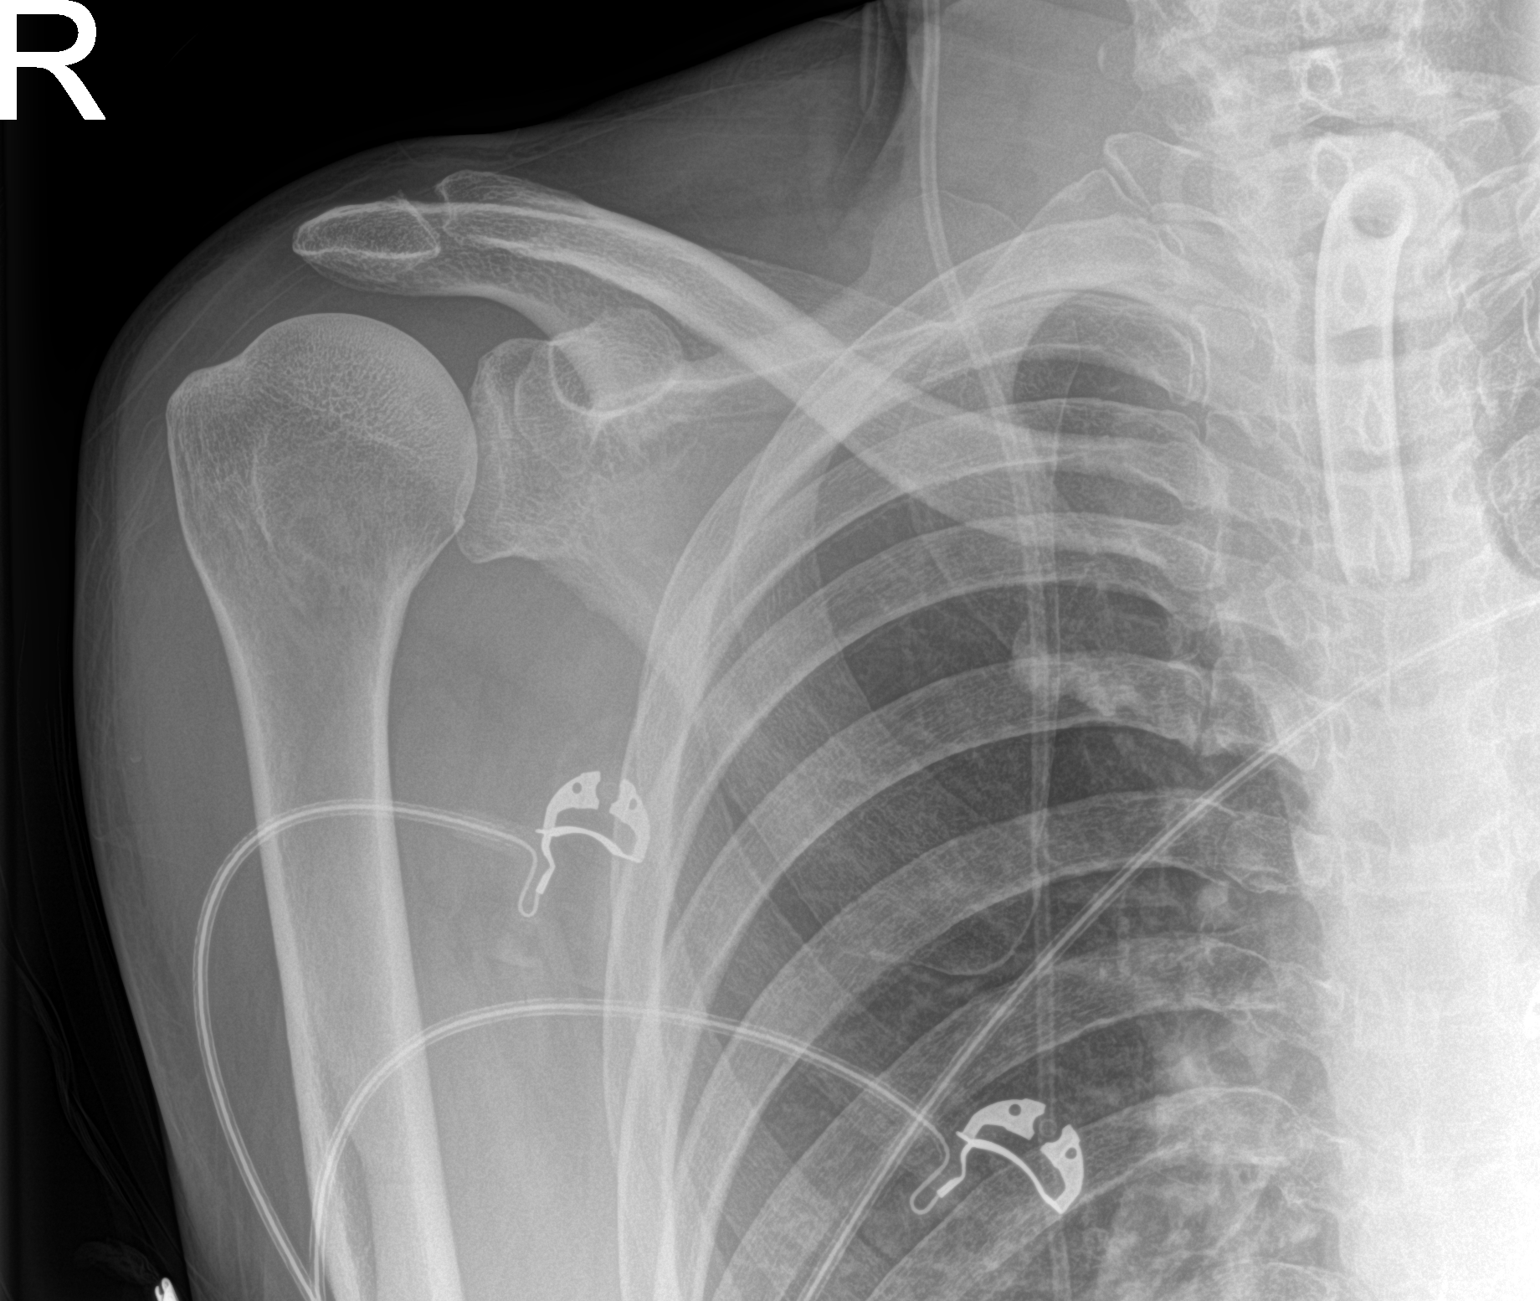
[im 2/2]
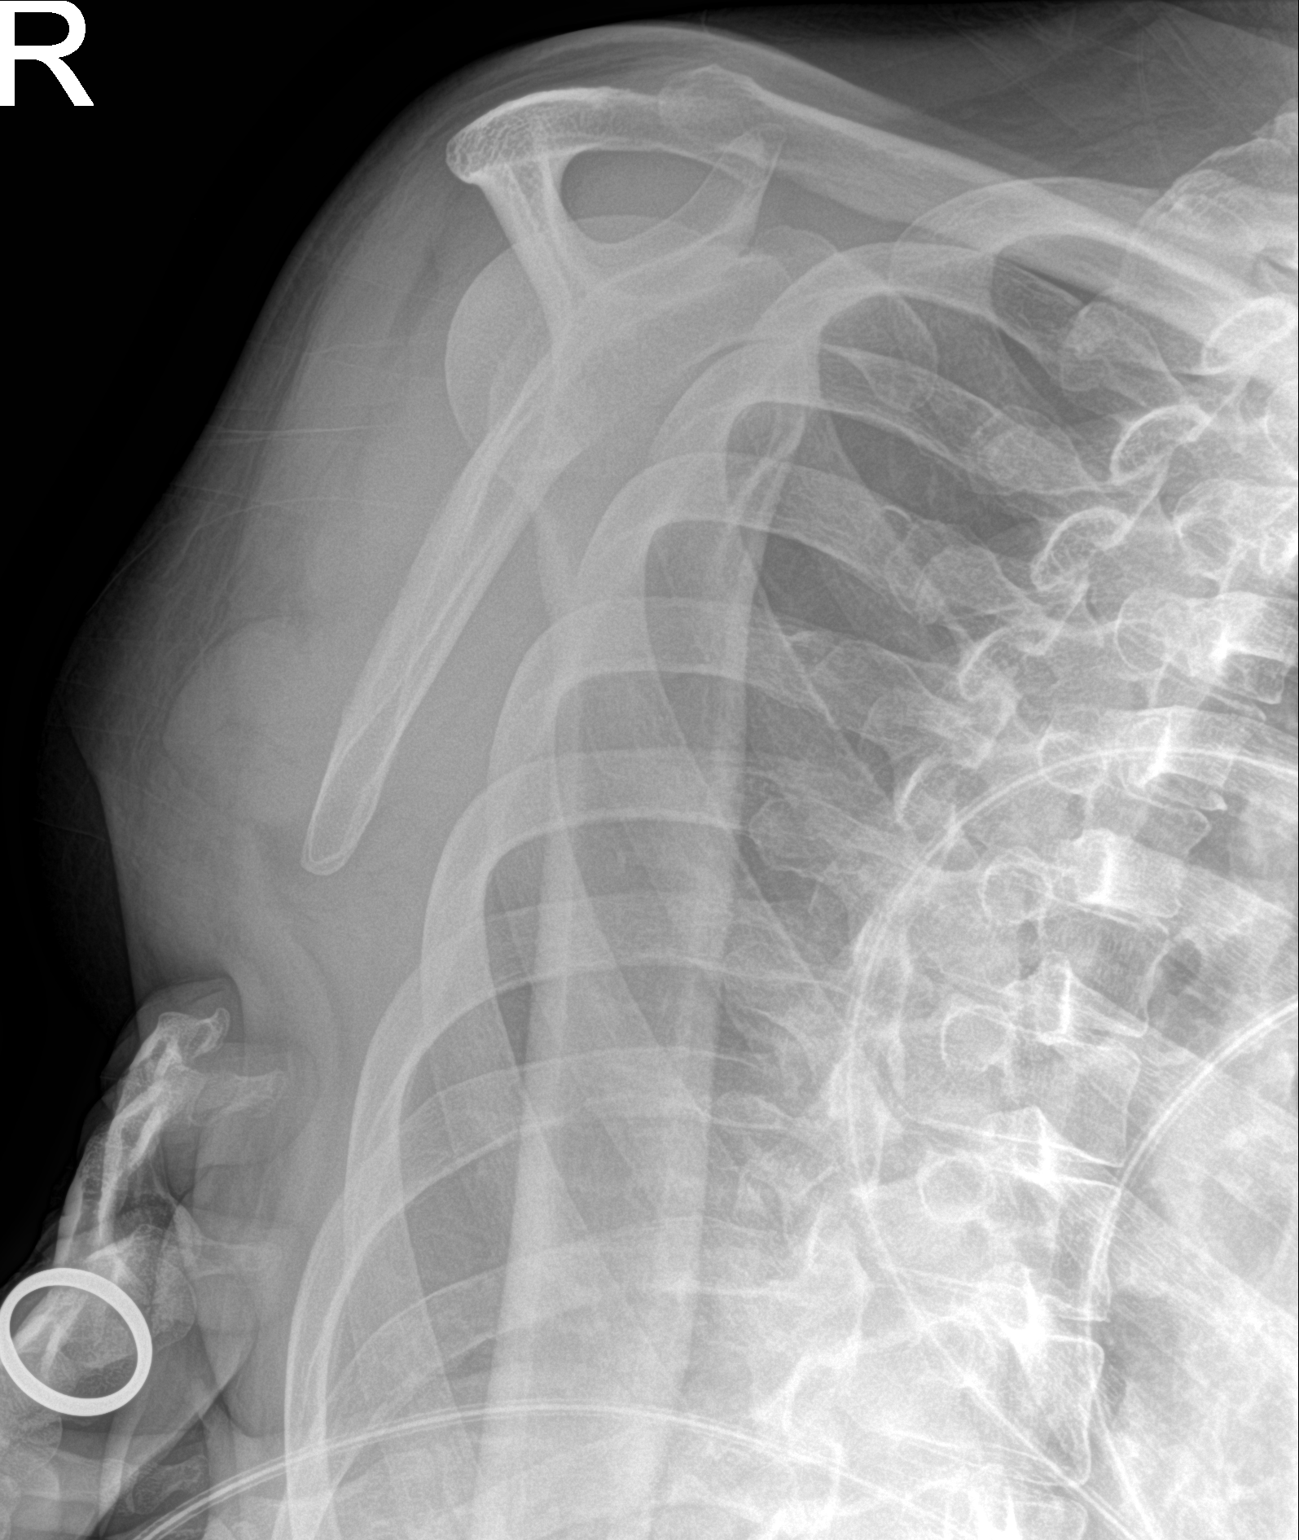

[2 of 2 positions shown; findings below may reference images not displayed]

FINDINGS: There is no evidence of fracture or dislocation. There is no
evidence of arthropathy or other focal bone abnormality. Soft
tissues are unremarkable.
IMPRESSION: Negative.

## 2020-08-26 IMAGING — DX DG WRIST 2V*R*
1 series · 2 of 2 positions shown · non-contrast
Comparison: None.

CLINICAL DATA: Right wrist pain after fall.

EXAM:
RIGHT WRIST - 2 VIEW

[Series 1: wrist · 0.14mm/px · 2 of 2 slices shown]
[im 1/2]
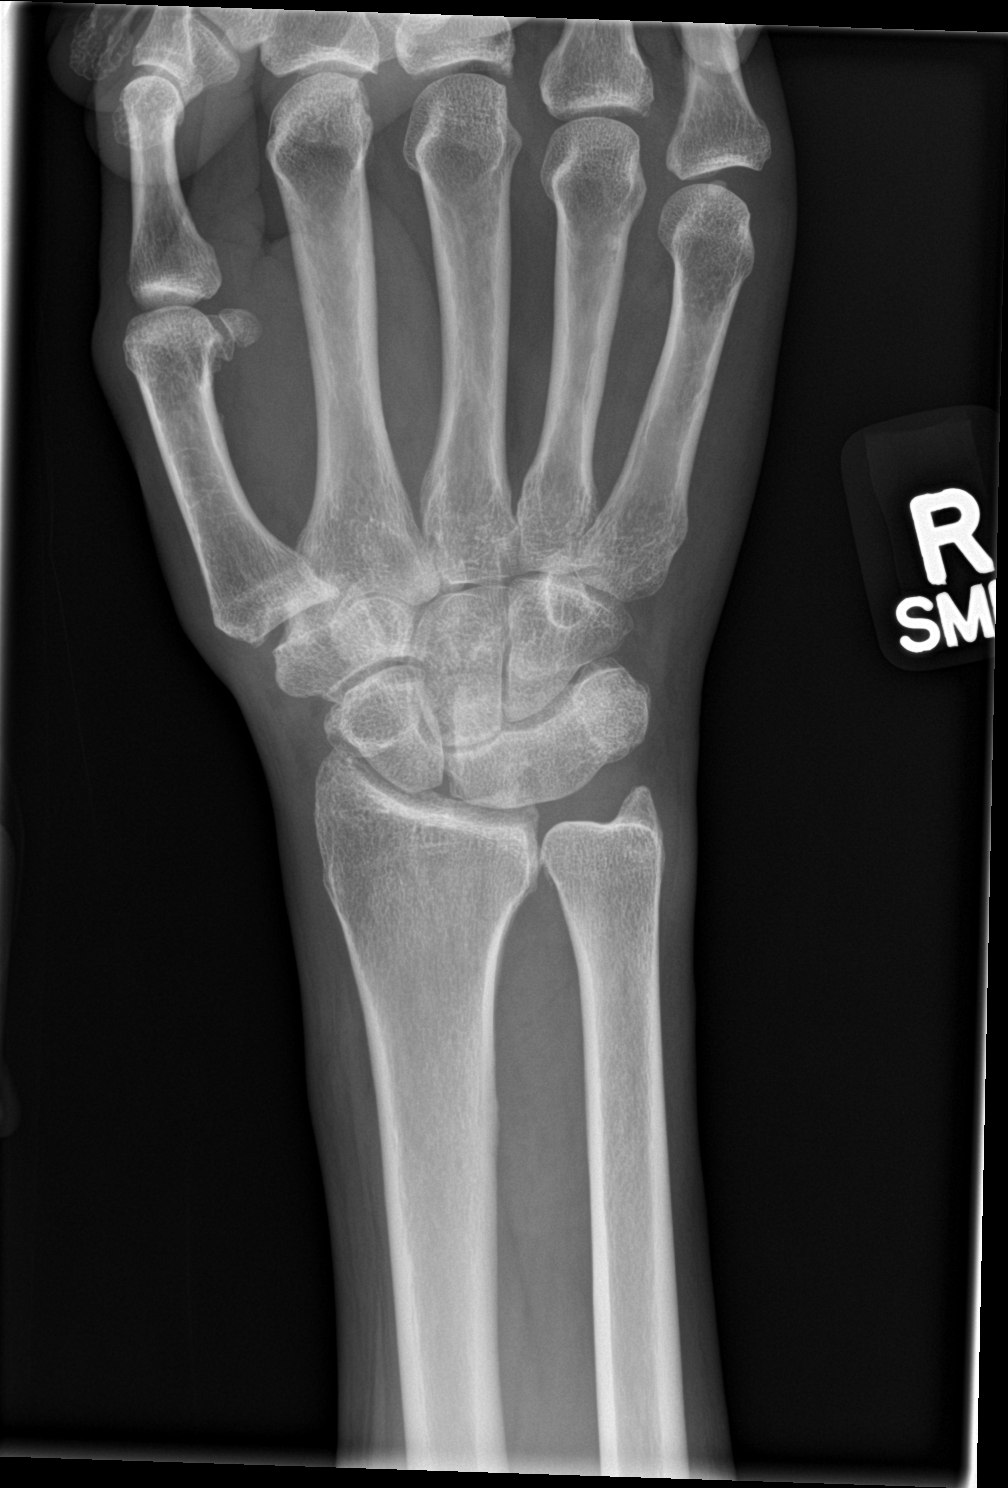
[im 2/2]
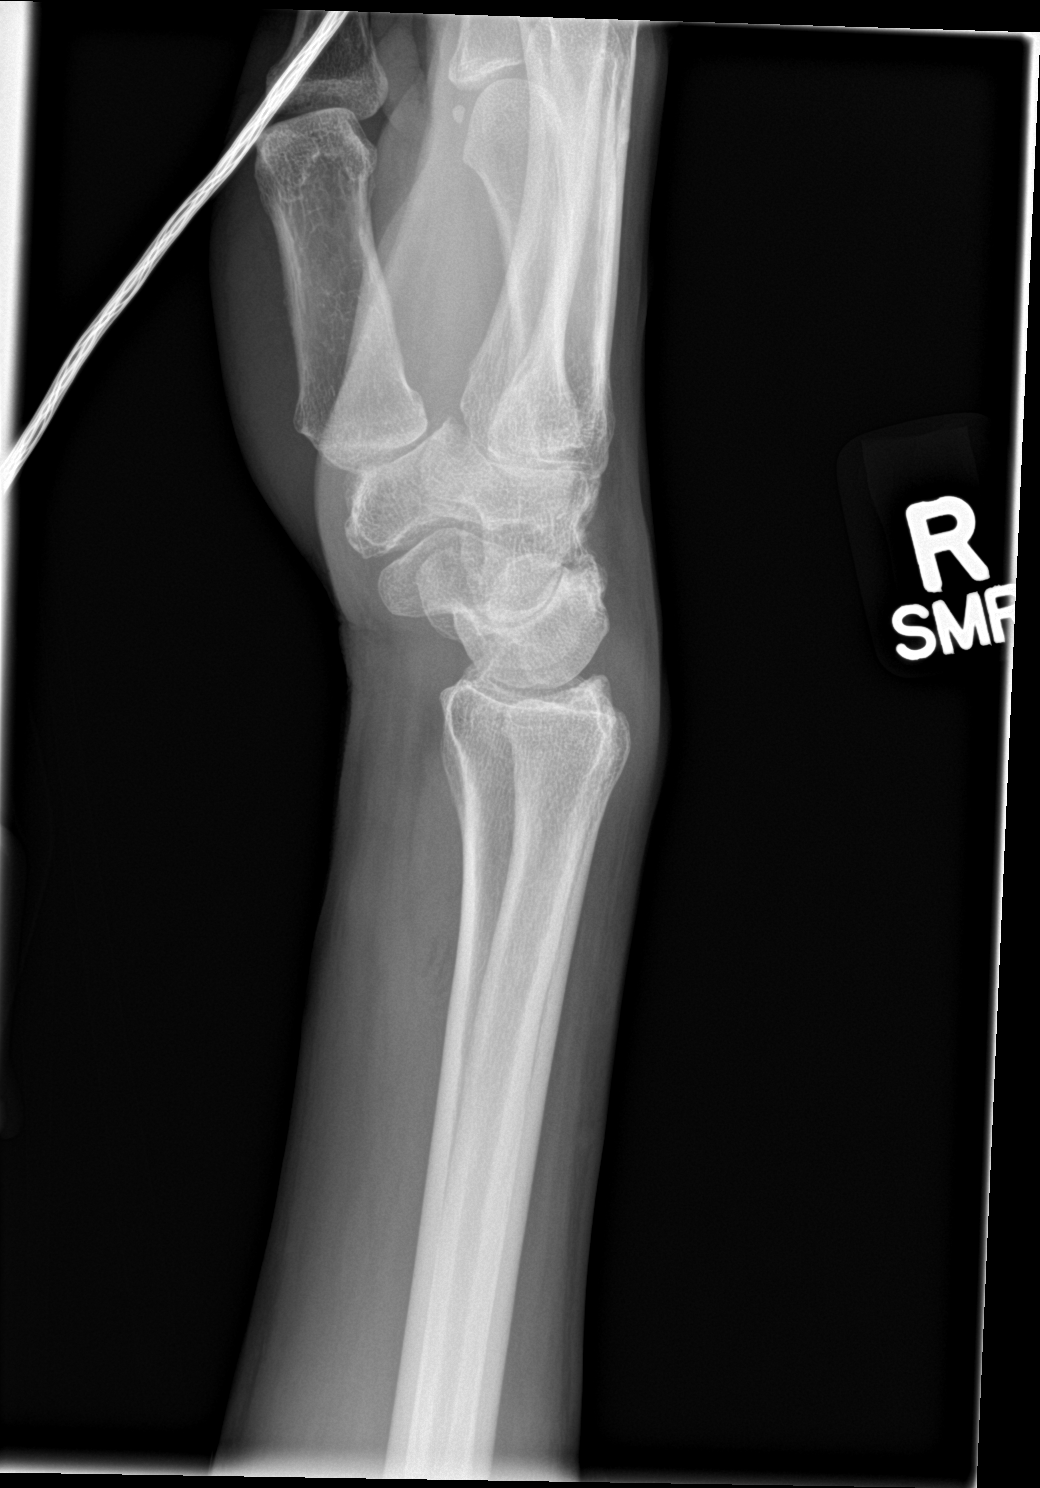

[2 of 2 positions shown; findings below may reference images not displayed]

FINDINGS: There is no evidence of fracture or dislocation. There is no
evidence of arthropathy or other focal bone abnormality. Soft
tissues are unremarkable.
IMPRESSION: Negative.

## 2020-08-26 IMAGING — DX DG HUMERUS 2V *L*
1 series · 2 of 2 positions shown · non-contrast
Comparison: None.

CLINICAL DATA: Fall.

EXAM:
LEFT HUMERUS - 2+ VIEW

[Series 1: humerus · 0.14mm/px · 2 of 2 slices shown]
[im 1/2]
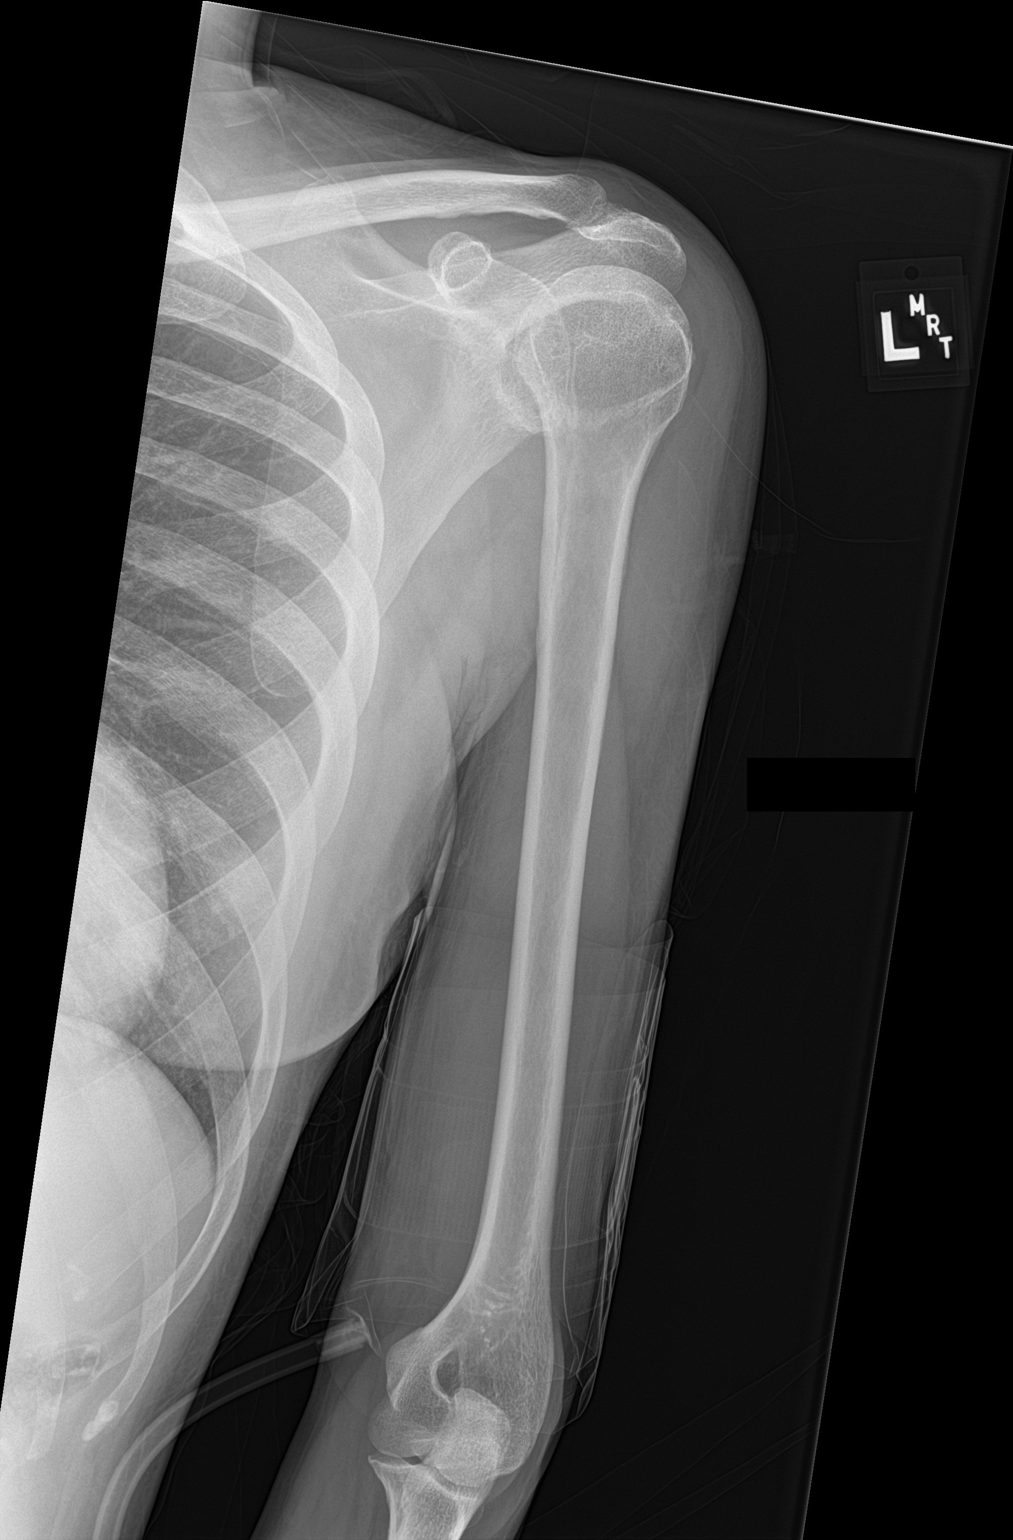
[im 2/2]
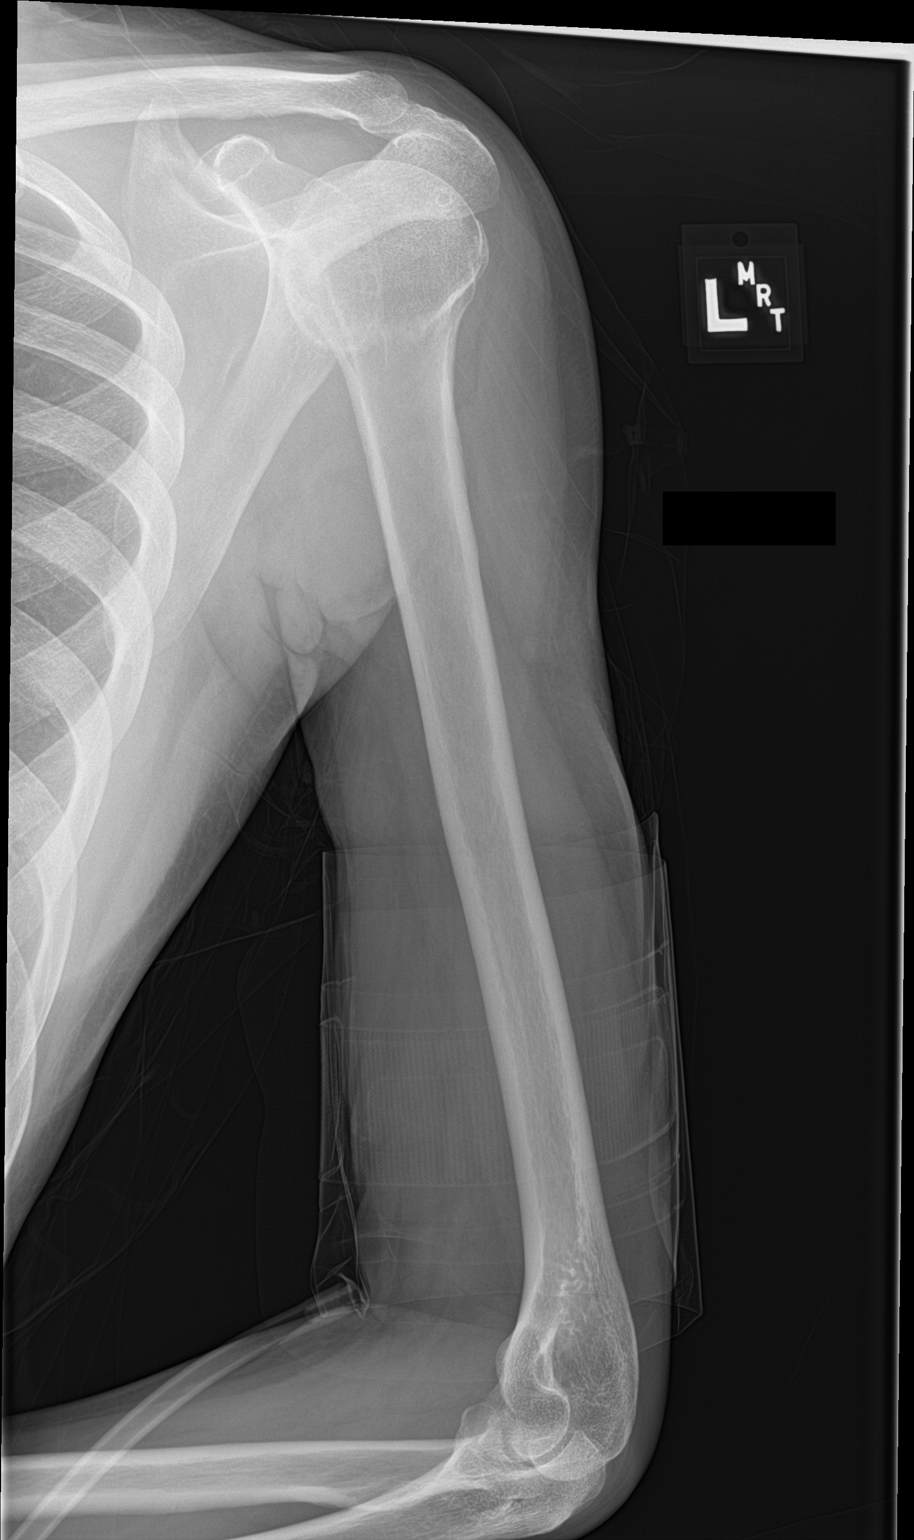

[2 of 2 positions shown; findings below may reference images not displayed]

FINDINGS: There is no evidence of fracture or other focal bone lesions. Soft
tissues are unremarkable.
IMPRESSION: Negative.

## 2020-08-26 IMAGING — DX DG FOREARM 2V*L*
2 series · 2 of 2 positions shown · non-contrast
Comparison: None.

CLINICAL DATA: Fall, left wrist and forearm pain

EXAM:
LEFT FOREARM - 2 VIEW; LEFT WRIST - COMPLETE 3+ VIEW

[forearm ap]
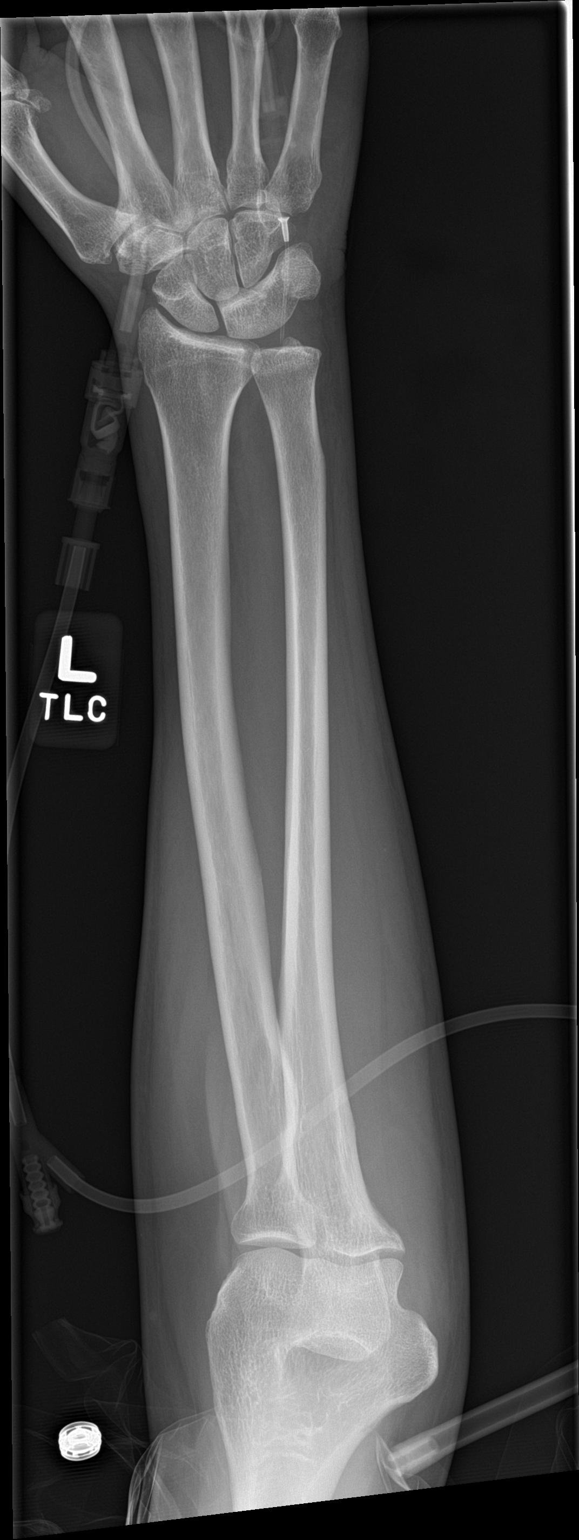

[forearm lat]
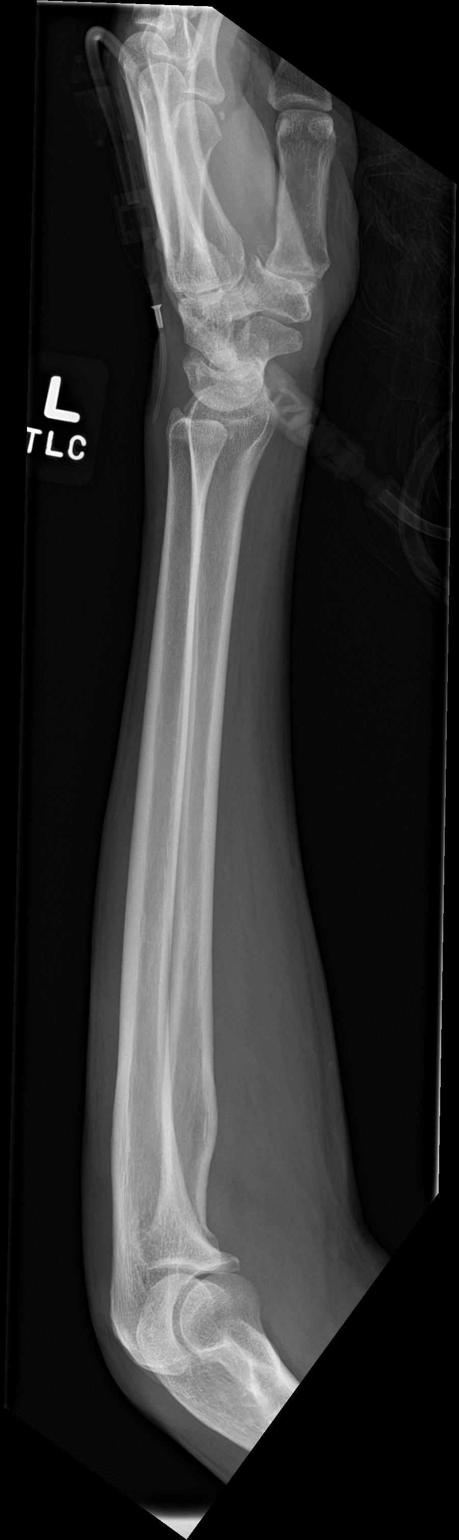

[2 of 2 positions shown; findings below may reference images not displayed]

FINDINGS: No evidence of acute fracture or dislocation involving the left
forearm or left wrist. Lunotriquetral coalition noted. Mild
osteoarthritis of the first CMC and triscaphe joints. IV tubing is
present at the ulnar aspect of the wrist. No focal soft tissue
swelling.
IMPRESSION: 1. No evidence of acute fracture or dislocation involving the left
forearm or left wrist.
2. Lunotriquetral coalition.

## 2020-08-26 IMAGING — CT CT CERVICAL SPINE W/O CM
3 of 4 series · 13 of 33 positions shown, 16 images · non-contrast
Comparison: None.

CLINICAL DATA: Fall down stairs

EXAM:
CT CERVICAL SPINE WITHOUT CONTRAST
TECHNIQUE: Multidetector CT imaging of the cervical spine was performed without
intravenous contrast. Multiplanar CT image reconstructions were also
generated.

[Series 4: c_spine 2.0 st · axial · 0.39mm/px · z∈[-276,-124]mm · 5 of 114 slices shown, 7 images]
[im 19/114  soft-tissue]
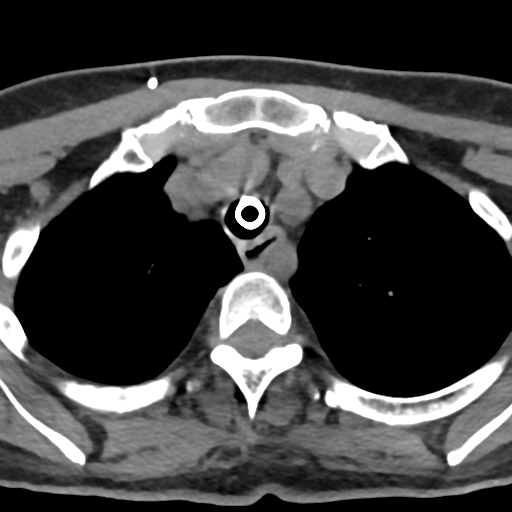
[im 19/114  bone]
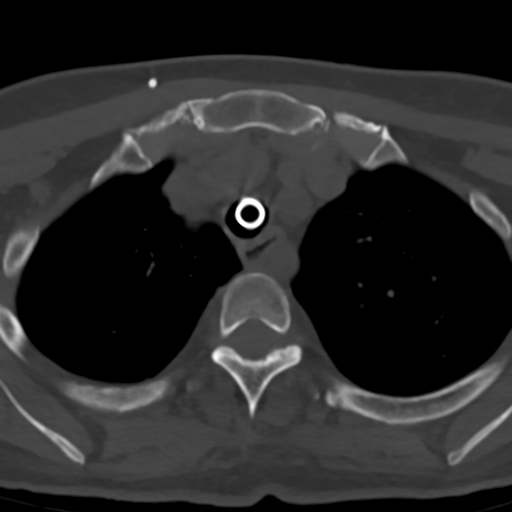
[im 38/114  bone]
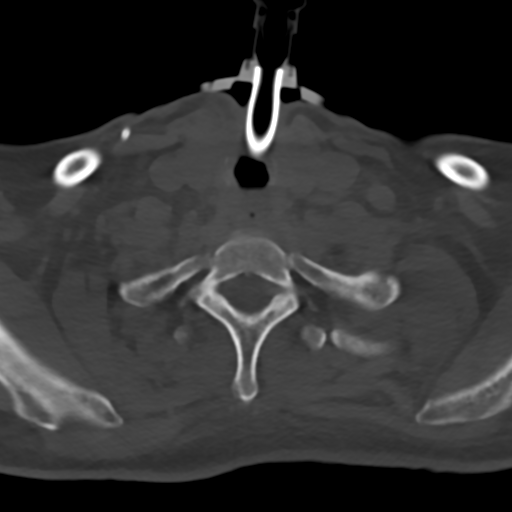
[im 57/114  bone]
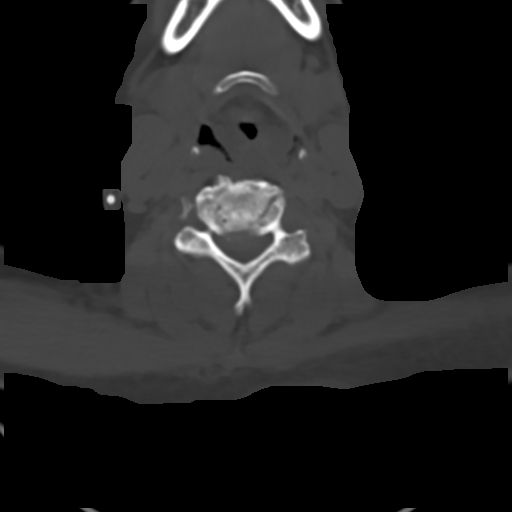
[im 76/114  bone]
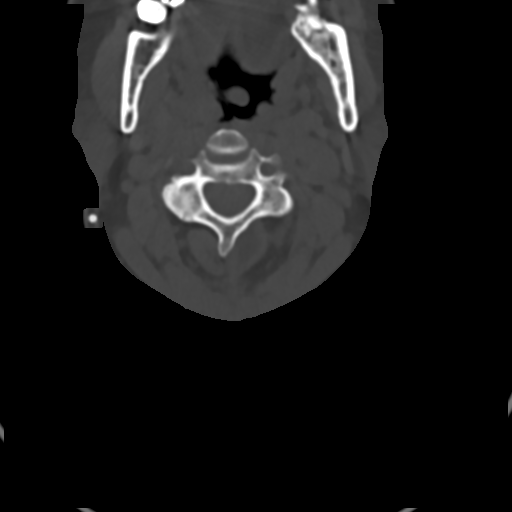
[im 95/114  soft-tissue]
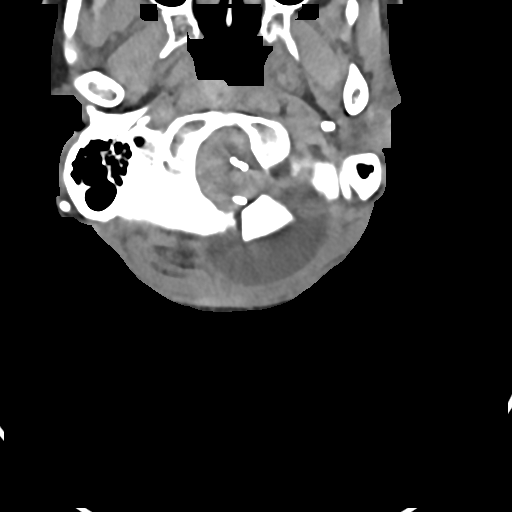
[im 95/114  bone]
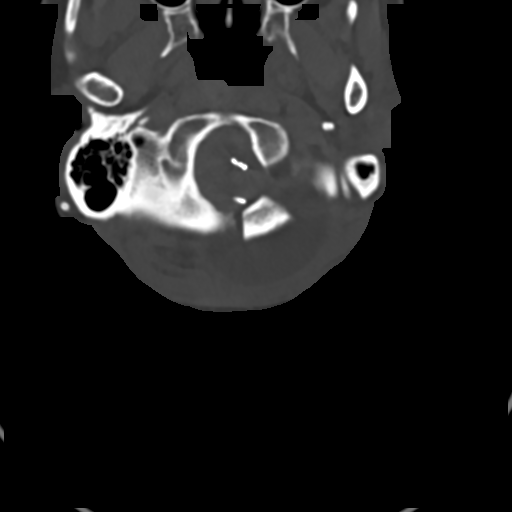

[Series 6: c_spine 2.0 sag bone · sagittal · 0.33mm/px · 5 of 61 slices shown, 6 images]
[im 21/61  bone]
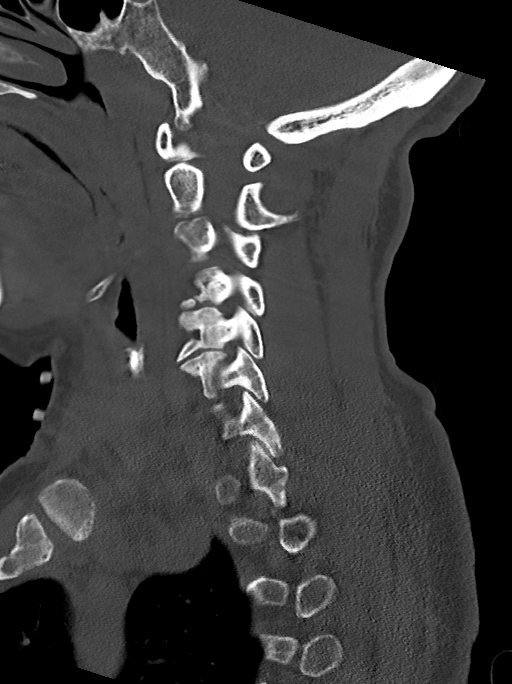
[im 26/61  bone]
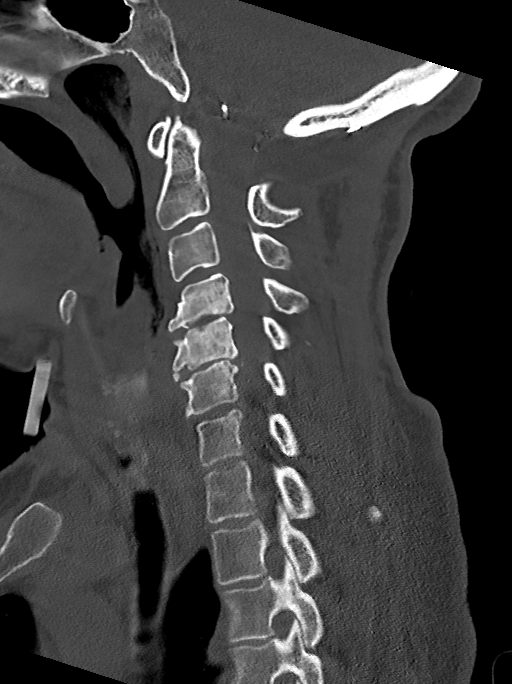
[im 31/61  soft-tissue]
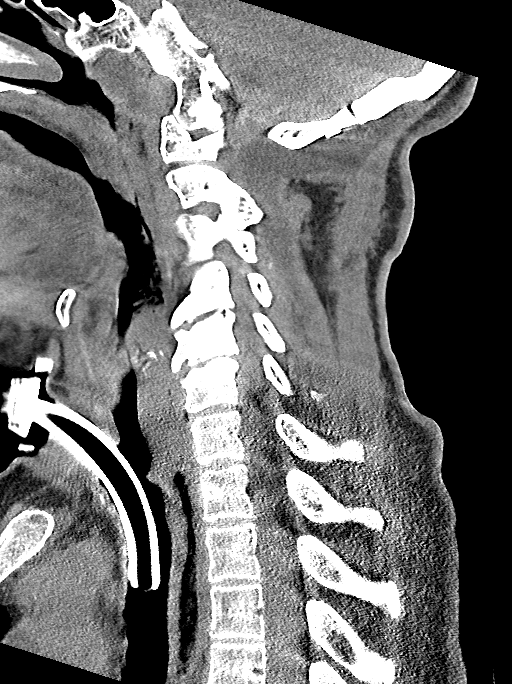
[im 31/61  bone]
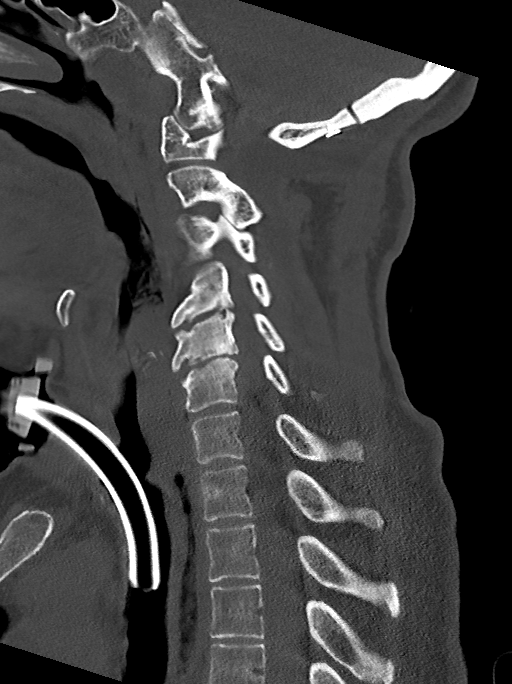
[im 36/61  bone]
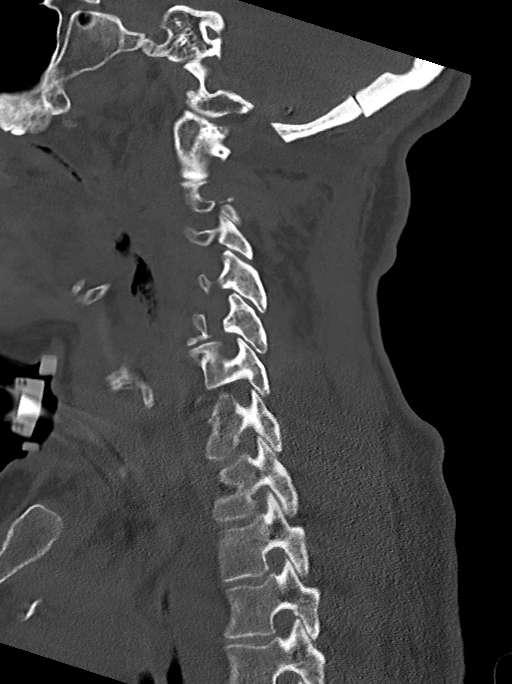
[im 41/61  bone]
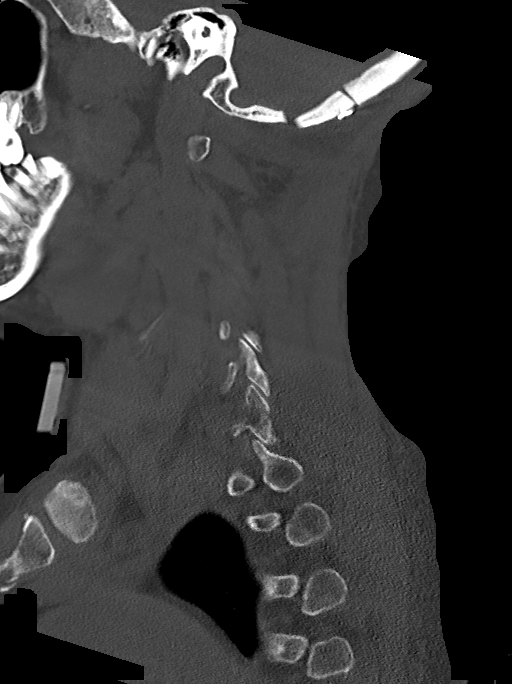

[Series 7: c_spine 2.0 cor bone · coronal · 0.33mm/px · 3 of 61 slices shown]
[im 13/61  bone]
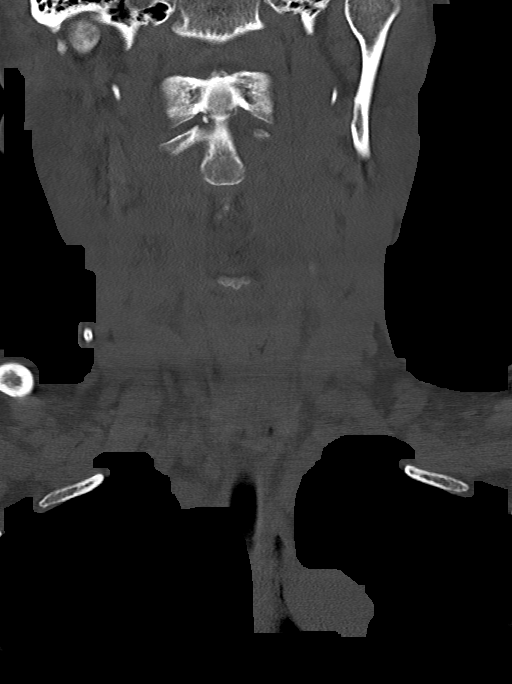
[im 25/61  bone]
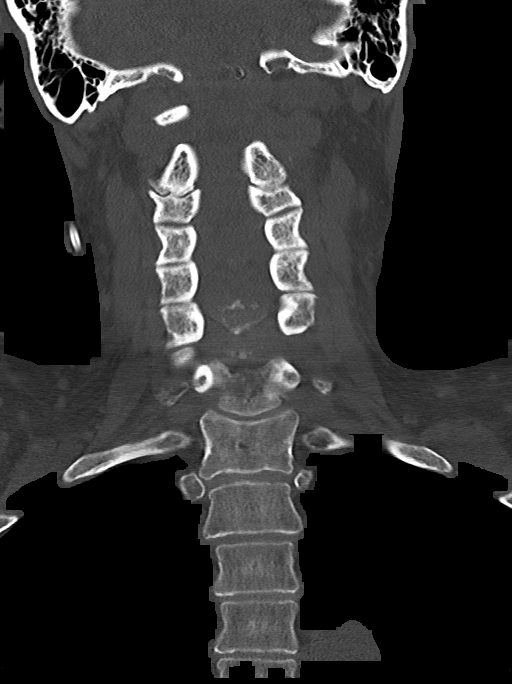
[im 37/61  bone]
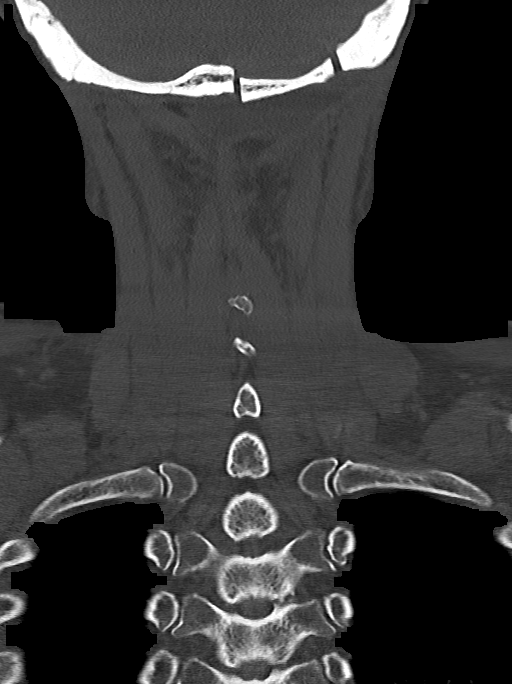

[13 of 33 positions shown; findings below may reference images not displayed]

FINDINGS: Alignment: Mild retrolisthesis at C4-C5 and C5-C6.

Skull base and vertebrae: Degenerative plate irregularity at C4-C5
and C5-C6. Vertebral body heights are otherwise maintained. No acute
cervical spine fracture. Probable prior partial resection of the
posterior arch of C1.

Soft tissues and spinal canal: No prevertebral fluid or swelling. No
visible canal hematoma.

Disc levels: Multilevel degenerative changes are present including
disc space narrowing, endplate osteophytes, and facet and
uncovertebral hypertrophy. These changes are greatest at C4-C5 and
C5-C6.

Upper chest: No apical lung mass.  Tracheostomy device is present.

Other: Unremarkable.
IMPRESSION: No acute cervical spine fracture.

## 2020-08-26 IMAGING — DX DG CHEST 1V PORT
1 series · 1 of 1 positions shown · non-contrast
Comparison: [DATE]

CLINICAL DATA: Fall.  Code stroke.

EXAM:
PORTABLE CHEST 1 VIEW

[chest]
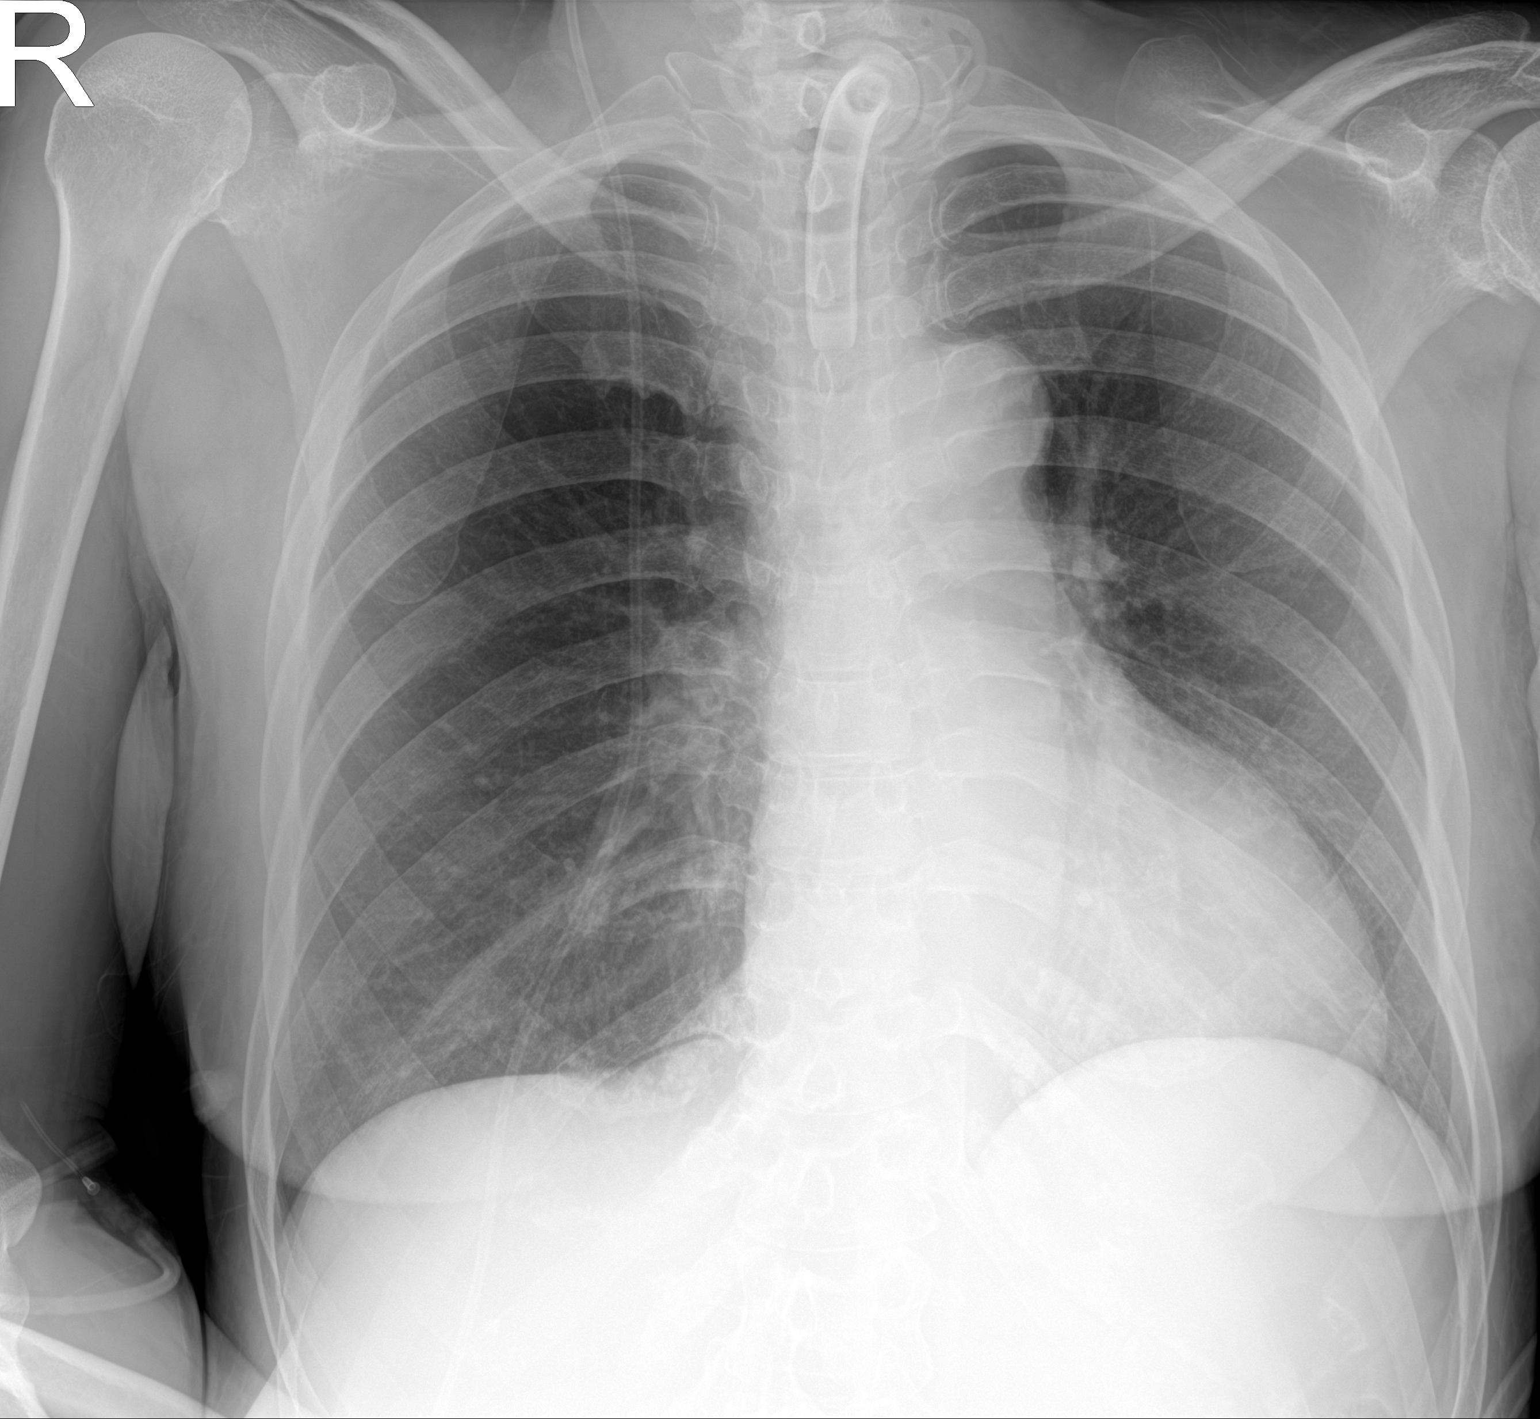

[1 of 1 positions shown; findings below may reference images not displayed]

FINDINGS: Tracheostomy in place. VP shunt passes over the right chest. Heart
size upper limits of normal. Mediastinal shadows otherwise
unremarkable. The lungs are clear. No edema or effusions.
IMPRESSION: No active disease.

## 2020-08-26 IMAGING — DX DG FEMUR 2+V PORT*L*
1 series · 4 of 4 positions shown · non-contrast
Comparison: None.

CLINICAL DATA: Fall.

EXAM:
LEFT FEMUR PORTABLE 2 VIEWS

[Series 1: femur · 0.14mm/px · 4 of 4 slices shown]
[im 1/4]
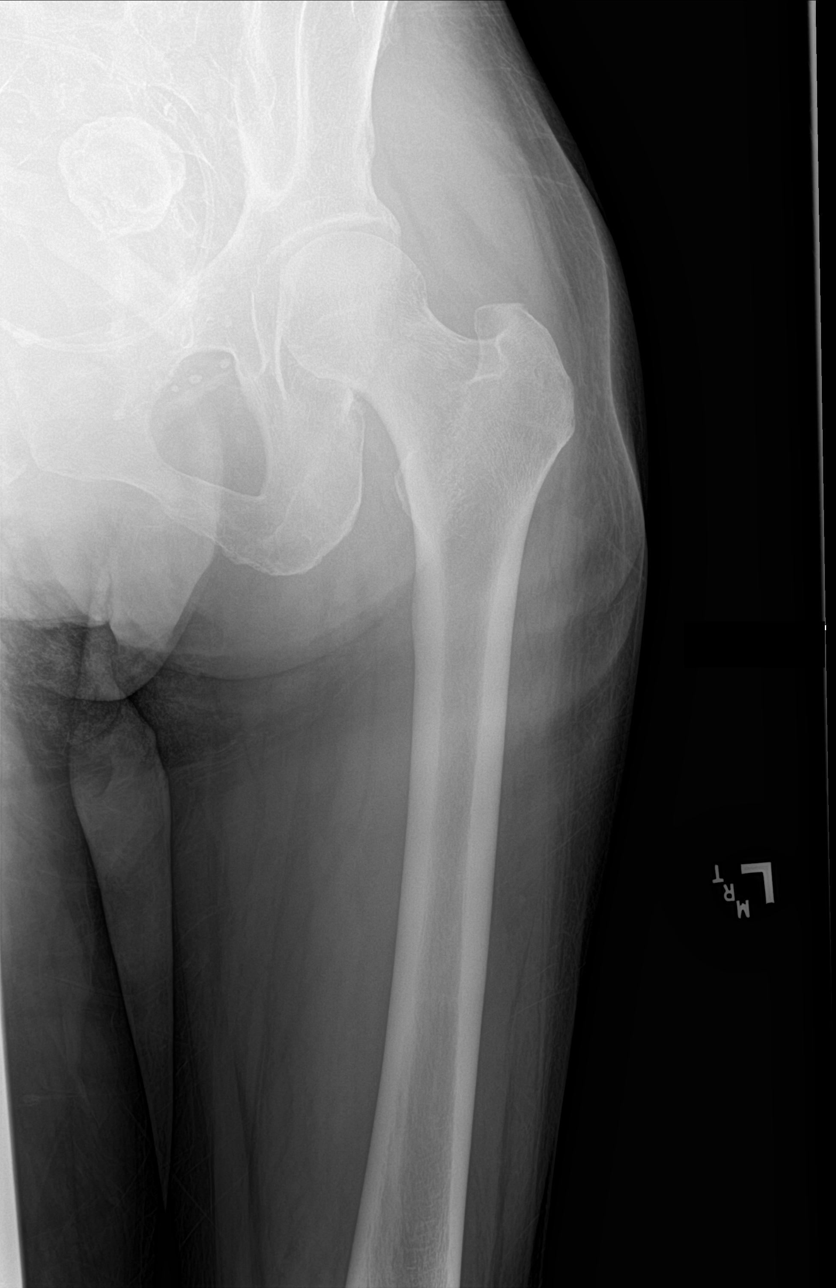
[im 2/4]
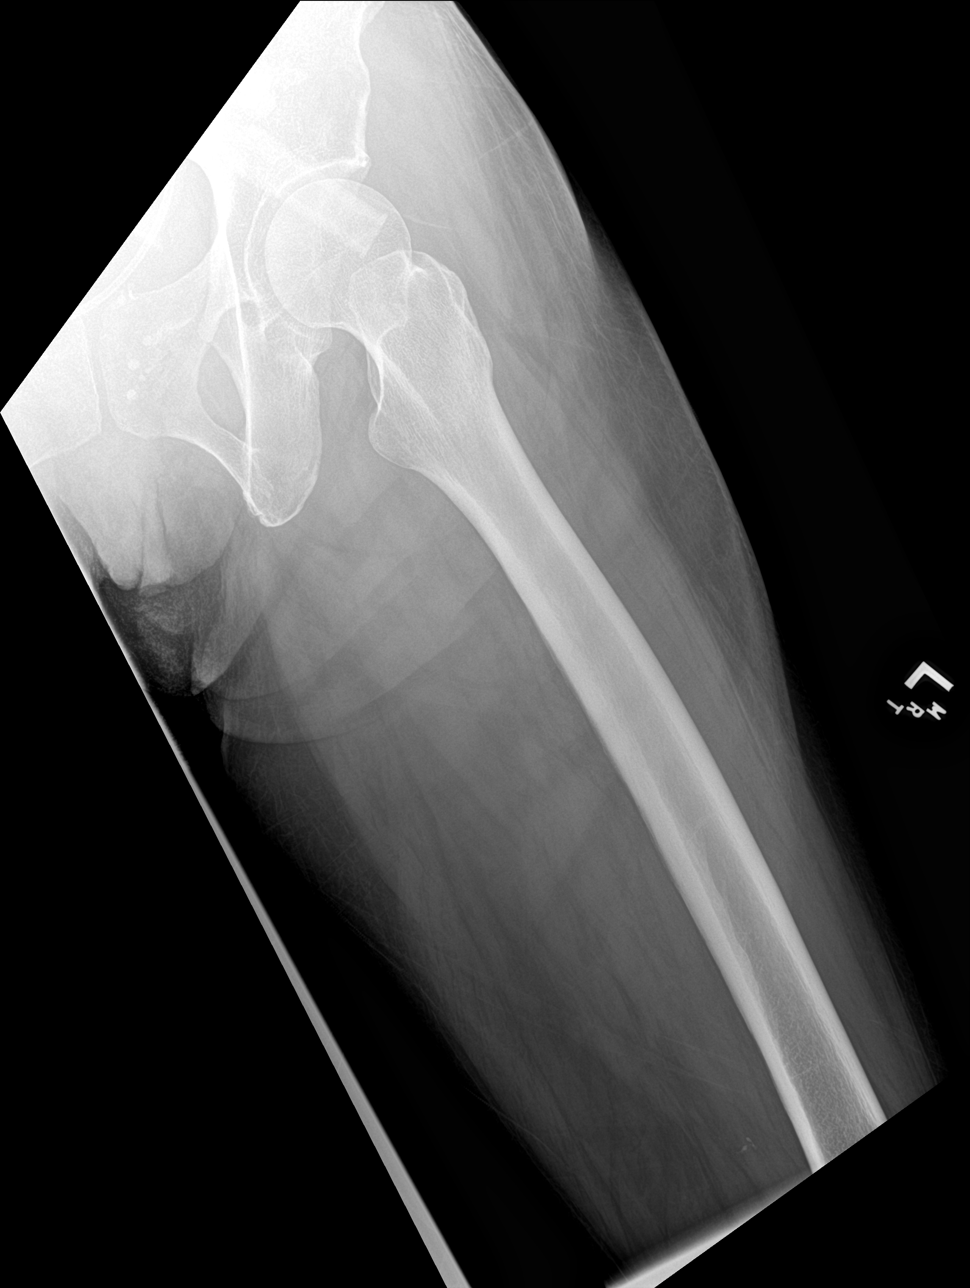
[im 3/4]
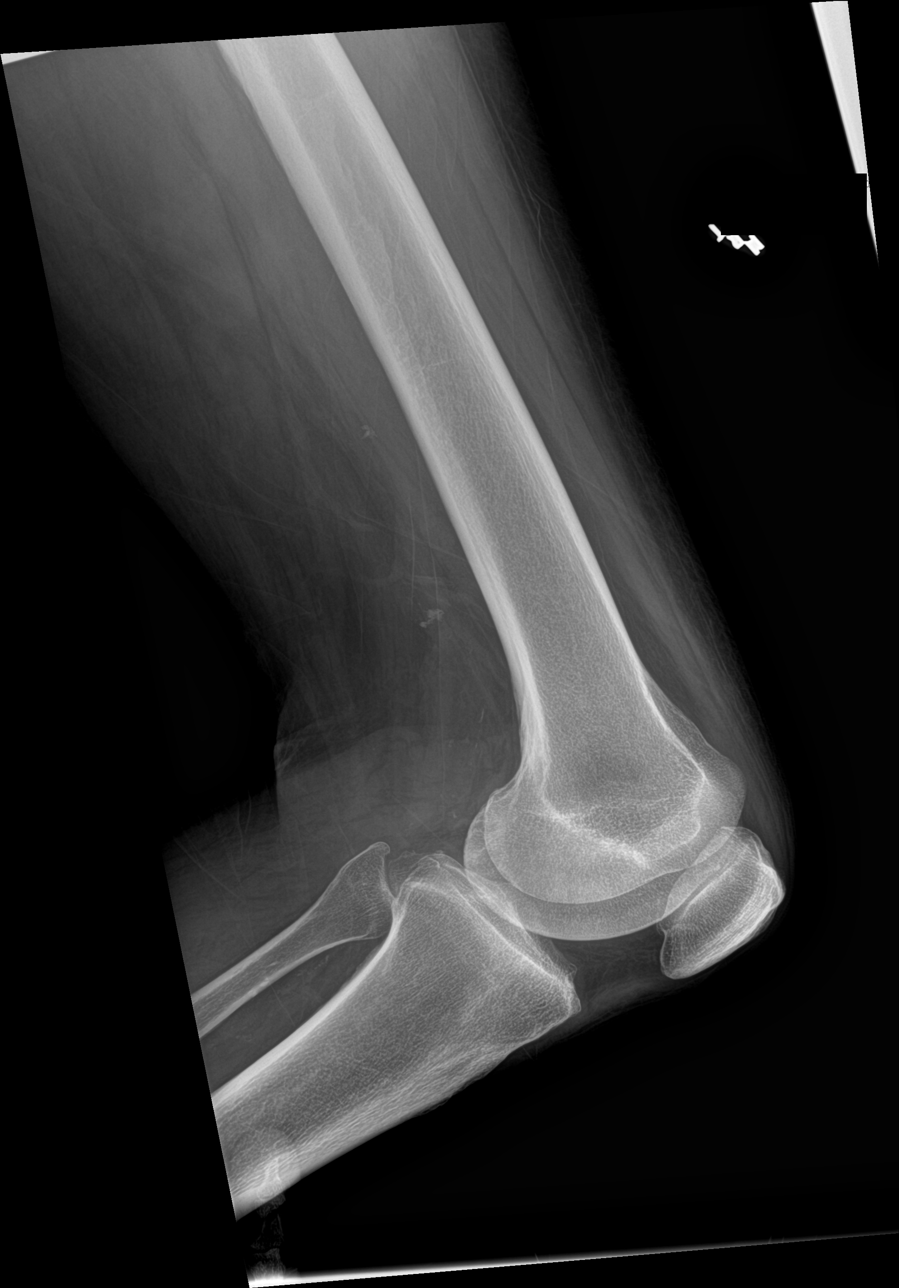
[im 4/4]
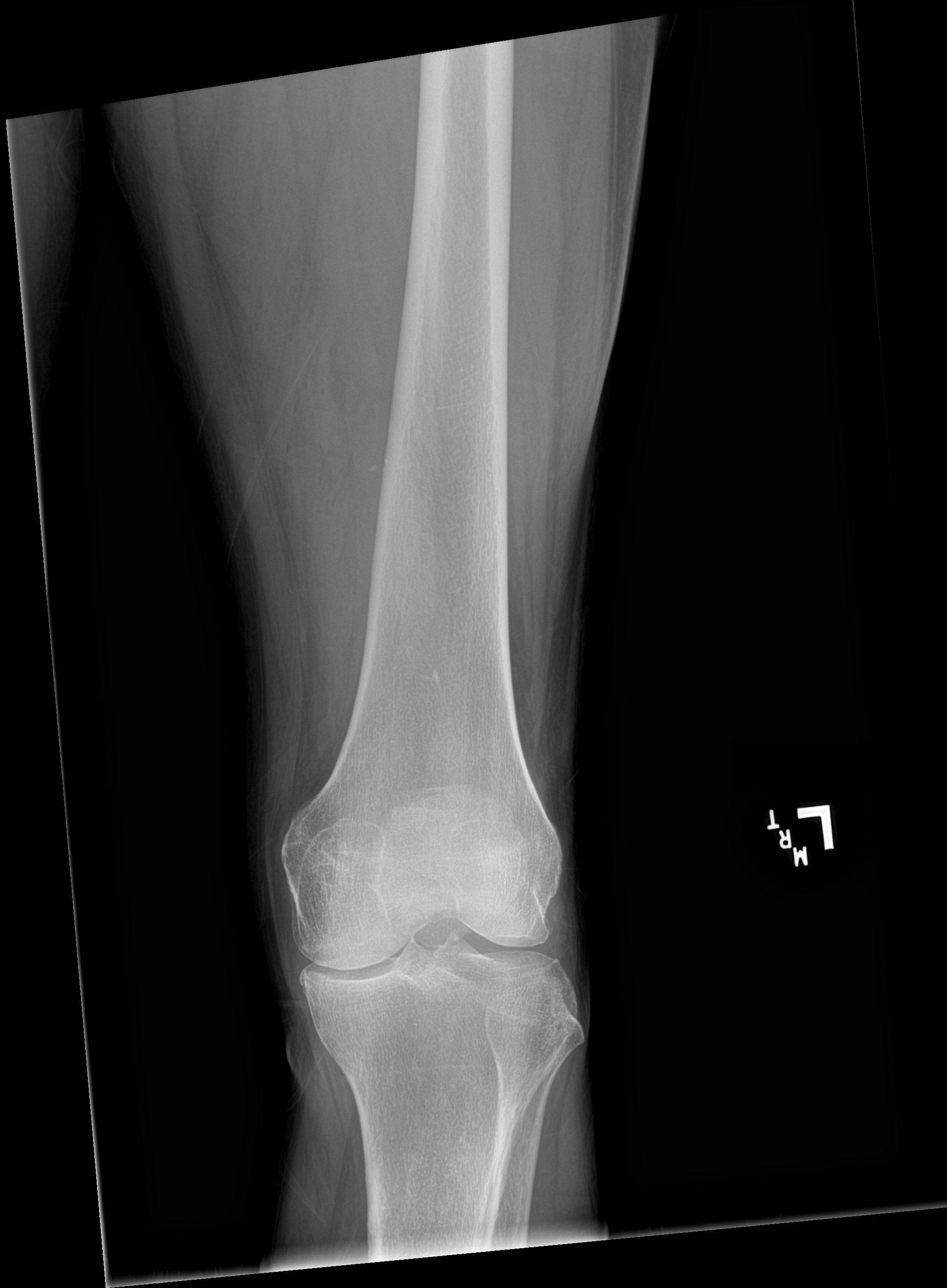

[4 of 4 positions shown; findings below may reference images not displayed]

FINDINGS: There is no evidence of fracture or other focal bone lesions. Soft
tissues are unremarkable.
IMPRESSION: Negative.

## 2020-08-26 MED ORDER — AMLODIPINE BESYLATE 10 MG PO TABS
10.0000 mg | ORAL_TABLET | Freq: Every day | ORAL | Status: DC
  Administered 2020-08-27 – 2020-09-10 (×15): 10 mg
  Filled 2020-08-26 (×16): qty 1

## 2020-08-26 MED ORDER — LEVETIRACETAM IN NACL 1000 MG/100ML IV SOLN
1000.0000 mg | Freq: Two times a day (BID) | INTRAVENOUS | Status: DC
  Administered 2020-08-27 – 2020-08-28 (×4): 1000 mg via INTRAVENOUS
  Filled 2020-08-26 (×4): qty 100

## 2020-08-26 MED ORDER — OSMOLITE 1.2 CAL PO LIQD
355.0000 mL | Freq: Three times a day (TID) | ORAL | Status: DC
  Administered 2020-08-26 – 2020-09-11 (×55): 355 mL
  Administered 2020-09-11: 237 mL
  Administered 2020-09-11 – 2020-10-07 (×98): 355 mL
  Filled 2020-08-26 (×64): qty 474

## 2020-08-26 MED ORDER — SCOPOLAMINE 1 MG/3DAYS TD PT72
1.0000 | MEDICATED_PATCH | TRANSDERMAL | Status: DC
  Administered 2020-08-26: 1.5 mg via TRANSDERMAL
  Filled 2020-08-26: qty 1

## 2020-08-26 MED ORDER — LEVOTHYROXINE SODIUM 100 MCG PO TABS
100.0000 ug | ORAL_TABLET | Freq: Every day | ORAL | Status: DC
  Administered 2020-08-27 – 2020-10-07 (×42): 100 ug
  Filled 2020-08-26 (×44): qty 1

## 2020-08-26 MED ORDER — LABETALOL HCL 200 MG PO TABS
300.0000 mg | ORAL_TABLET | Freq: Three times a day (TID) | ORAL | Status: DC
  Administered 2020-08-26 – 2020-09-11 (×47): 300 mg
  Filled 2020-08-26 (×8): qty 1
  Filled 2020-08-26: qty 2
  Filled 2020-08-26 (×39): qty 1

## 2020-08-26 MED ORDER — HYDRALAZINE HCL 50 MG PO TABS
50.0000 mg | ORAL_TABLET | Freq: Three times a day (TID) | ORAL | Status: DC
  Administered 2020-08-26 – 2020-09-11 (×48): 50 mg
  Filled 2020-08-26 (×5): qty 1
  Filled 2020-08-26: qty 2
  Filled 2020-08-26 (×43): qty 1

## 2020-08-26 MED ORDER — ACETAMINOPHEN 325 MG PO TABS
650.0000 mg | ORAL_TABLET | Freq: Four times a day (QID) | ORAL | Status: DC | PRN
  Administered 2020-08-26: 650 mg
  Filled 2020-08-26: qty 2

## 2020-08-26 MED ORDER — SODIUM CHLORIDE 0.9% FLUSH: 3.0000 mL | Freq: Once | INTRAVENOUS | Status: DC

## 2020-08-26 MED ORDER — CLEVIDIPINE BUTYRATE 0.5 MG/ML IV EMUL
0.0000 mg/h | INTRAVENOUS | Status: DC
  Administered 2020-08-26: 1 mg/h via INTRAVENOUS
  Filled 2020-08-26: qty 50

## 2020-08-26 MED ORDER — AMLODIPINE BESYLATE 5 MG PO TABS: 10.0000 mg | ORAL_TABLET | Freq: Every day | ORAL | Status: DC

## 2020-08-26 MED ORDER — QUETIAPINE FUMARATE 50 MG PO TABS
50.0000 mg | ORAL_TABLET | Freq: Two times a day (BID) | ORAL | Status: DC
  Administered 2020-08-27 – 2020-10-07 (×84): 50 mg
  Filled 2020-08-26 (×85): qty 1

## 2020-08-26 MED ORDER — FREE WATER
200.0000 mL | Freq: Four times a day (QID) | Status: DC
  Administered 2020-08-26 – 2020-10-07 (×157): 200 mL

## 2020-08-26 MED ORDER — LEVETIRACETAM IN NACL 1000 MG/100ML IV SOLN: 1000.0000 mg | Freq: Two times a day (BID) | INTRAVENOUS | Status: DC

## 2020-08-26 MED ORDER — PROSOURCE TF PO LIQD
45.0000 mL | Freq: Two times a day (BID) | ORAL | Status: DC
  Administered 2020-08-27 – 2020-10-07 (×83): 45 mL
  Filled 2020-08-26 (×85): qty 45

## 2020-08-26 MED ORDER — LORAZEPAM 2 MG/ML IJ SOLN: 1.0000 mg | Freq: Once | INTRAMUSCULAR | Status: DC | PRN

## 2020-08-26 MED ORDER — HYDRALAZINE HCL 25 MG PO TABS: 50.0000 mg | ORAL_TABLET | Freq: Three times a day (TID) | ORAL | Status: DC

## 2020-08-26 MED ORDER — LEVETIRACETAM IN NACL 1000 MG/100ML IV SOLN
1000.0000 mg | Freq: Once | INTRAVENOUS | Status: AC
  Administered 2020-08-26: 1000 mg via INTRAVENOUS
  Filled 2020-08-26: qty 100

## 2020-08-26 MED ORDER — LABETALOL HCL 200 MG PO TABS: 300.0000 mg | ORAL_TABLET | Freq: Three times a day (TID) | ORAL | Status: DC

## 2020-08-26 NOTE — Progress Notes (Signed)
Called regarding pt presentation to ED s/p fall, likely pain-limited left weakness/splinting. 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 There is a new small acute component to the left-sided convexity hygroma. Does not require any specific intervention, although with the relatively small ventricles I may consider increasing the setting on her shunt. If she is admitted I can do this while she's here in the hospital, otherwise can be done on outpatient f/u.   [name], MD [facility]

## 2020-08-26 NOTE — H&P (Addendum)
[facility] Admission History and Physical Service Pager: [phone]  Patient name: Stacy Moran Medical record number: [id number] Date of birth: [birth year] Age: 65 y.o. Gender: female  Primary Care Provider: [name], MD Consultants: Neurology (s/o), Neurosurgery  Code Status: FULL   Preferred Emergency Contact:  Contact Information    Name Relation Home Work Mobile   Morgan,[name] Daughter      [name] Daughter   [phone]     Chief Complaint: Fall, weakness  Assessment and Plan: Stacy Moran is a 65 y.o. female presenting s/p fall admitted for increased weakness and pain since fall this morning. 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    Fall 2/2 Unsteady Gait  Increased Weakness Fall this morning, unwitnessed, but appears to be due to unsteady gait. Patient has unsteadiness at baseline. In ED triage there was concern for stroke given increased left-sided weakness. CT head with acute on chronic left side subdural, increased since 5/4. Neurology consulted and recommend MRI brain but feel that this is less likely due to stroke or seizure. Neurosurgery consulted in ED and notes no specific intervention though will adjust shunt settings in hospital. Had negative chest, c-spine, pelvis, left femur/humerus/wrist/forearm x-rays in ED. Patient appears to have some tenderness to right-side as well and with decreased grip strength-could be related to soreness from fall but will need further evaluation for fractures. Currently, daughter is full-time caretaker of patient.  She requires 24/7 care given her current state and high fall risk.  Daughter is interested in potential placement for patient following this admission. Has previously been admitted to CIR. Will admit for monitoring overnight given acute on chronic SDH following fall  and for placement following hospital discharge.  -Admit to [facility]PTS med-tele, Dr. Owens Shark  - F/u MRI brain; 1 mg Ativan ordered for sedation prior to MRI -Vitals per floor protocol  -Fall precautions -Seizure precautions  -PT/OT eval and treat  -F/u right-side x-ray imaging: shoulder, humerus, wrist and hand -1:1 tele monitoring given patient is high fall risk; if fails, 1:1 sitter -Low-threshold for EEG if patient has fluctuating mental status -SCD's for VTE ppx   Acute on Chronic Subdural Hematoma vs Hygroma  Hx shunt Neurosurgery consulted in ED. Discussed case with Dr. Kathyrn Sheriff who will see patient and adjust settings on her shunt. He recommends holding VTE prophylaxis for now and doing repeat CT head tomorrow. If findings are stable then can start subcutaneous heparin. -Neurosurgery consulted: will adjust shunt settings -Repeat Head CT tomorrow AM -Start subcutaneous heparin for VTE ppx tomorrow if imaging stable  -SCD's for VTE ppx for now  HTN: Chronic, stable BP ranging 122-153/79-116, most recent 125/93. Home medications include: Amlodipine 10 mg daily, hydralazine 50 mg q8h, labetalol 300 mg TID. Was on clevidipine gtt in ED.  -Vitals per protocol  -SBP goal <140 -Continue home amlodipine, hydralazine, lebatolol -D/c clevidipine   Anemia: Chronic, stable Baseline hemoglobin around 11-12. Hgb today 11.4 without obvious signs of bleeding or lacerations. Iron panel performed 5/13 which was normal. -CBC daily  -Monitor change in mental status   Hx Seizures: stable   Has had seizures from prior aneurysm rupture.  Additionally, was hospitalized on 5/4 for 2 episodes of witnessed seizures. Of note, she was not on Keppra during that time but started back on 750 mg Keppra BID. Her VP shunt was also interrogated at that time  and settings adjusted to 2.  Since being back on Keppra reports no recurrence of seizures. Neurology evaluated patient in ED and does not believe patient had seizure  and are deferring EEG at this time. S/p 1g Keppra bolus in ED. Seen by Neurology in ED  -Seizure precautions  -Home Keppra increased to 1000 mg daily   Dysphagia, with PEG-tub  Increased Secretions Takes scopolamine patch for increased secretions.   -SLP consult  -NPO; all feeds and medications per tube  GERD: Chronic, stable Home medication: Metoclopramide  -Holding Metoclopramide   Hypothyroidism s/p total thyroidectomy Takes synthroid 100 mcg daily. Last TSH within normal limits on 06/05/20 at 4.39. -Continue home synthroid   Anxiety  Agitation Home medication includes Klonopin 0.5 mg twice daily, Seroquel 50 mg twice daily.  Daughter notes that during previous hospitalization she required restraints due to frequently trying to get up and due to her agitation.  Does not feel that her current medication regimen really helps her. -Holding Klonopin -Continue Seroquel 50 mg twice daily; can increase if more agitated -1:1 telemetry sitter  Prediabetes Hgb A1c 5.8. Glucose on BMP 201>194.  -Monitor on BMP for now   FEN/GI: NPO; per tube Prophylaxis: SCD's  Disposition: Med-tele  History of Present Illness:  Stacy Moran is a 65 y.o. female presenting s/p unwitnessed fall this morning.   History obtained by daughter who reports that patient started off having a normal morning. She typically walks with the assistance of daughter though occasionally she gets up early. This morning she had gotten up and walked down to the basement. Slipped on some stairs. Grandchildren heard the fall and she was laying flat on her back. Patient able to nod yes when asked if her left hand hurts.   Normally not able to sleep well at night. Gets restless.   Usually doesn't walk well without assistance. Stumbles, hard of balance. Has wheelchair and rollater but unable to control well.   Has had falls before from being unstable and trying to use the bathroom. Daughter is primary caretaker. Has had to  stop working through all of this as her mother requires 24/7 care. States that her worst fear was having her mom fall down the steps. Daughter is interested in a rehab/care facility for her mother.   Daughter reports she had her first episode of seizure early this month, was started on Keppra. Denies any recurrence of seizures since starting this medication.  Has humidifying machine that daughter gives when she is coughing. Denies any new cough but has frequent secretions. Takes medication for that and feels her secretions are slightly improved.  Notes increased weakness of both hands. Daughter also feels that patient is a little more confused/disoriented than normally.     Formula feeds through PEG tube every 4 hours.   Review Of Systems: Per HPI with the following additions:   Review of Systems  HENT: Positive for drooling.   Respiratory: Negative for cough and shortness of breath.   Cardiovascular: Negative for chest pain and palpitations.  Gastrointestinal: Negative for abdominal pain, constipation, diarrhea and vomiting.  Genitourinary: Negative for decreased urine volume, difficulty urinating and hematuria.  Musculoskeletal: Positive for gait problem. Negative for back pain and neck pain.  Neurological: Positive for weakness. Negative for dizziness, seizures, syncope, facial asymmetry and headaches.     Patient Active Problem List   Diagnosis Date Noted  . Fall (on) (from) other stairs and steps, initial encounter 08/26/2020  . SDH (subdural hematoma) ([place]) 07/31/2020  .  History of hemorrhagic cerebrovascular accident (CVA) with residual deficit 06/06/2020  . 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 S/P ventriculoperitoneal shunt 06/06/2020  . Functional urinary incontinence 06/06/2020  . Ineffective airway clearance   . Thrombocytopenia ([place])   . Restlessness   . Restlessness and agitation   . Acute blood loss anemia   . Essential hypertension   . Seizure prophylaxis   .  Protein-calorie malnutrition, severe 04/24/2020  . Dysphagia, post-stroke   . Benign essential HTN   . Dysphagia   . Status post tracheostomy ([place])   . S/P percutaneous endoscopic gastrostomy (PEG) tube placement ([place])   . ICH (intracerebral hemorrhage) ([place]) 04/22/2020  . Obstructive hydrocephalus ([place])   . Seizure-like activity ([place]) 03/12/2020  . PEG (percutaneous endoscopic gastrostomy) status ([place]) 03/08/2020  . Hyperglycemia 03/08/2020  . Acute respiratory failure ([place])   . Ventilator dependence ([place])   . 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 Tracheostomy status ([place]) 02/19/2020  . Abdominal distention   . Ruptured aneurysm of artery ([place])   . SAH (subarachnoid hemorrhage) ([place])   . Subarachnoid bleed ([place])   . Tachypnea   . Prediabetes   . Hypokalemia   . Leukocytosis   . Ileus, postoperative ([place])   . Pressure injury of skin 02/13/2020  . Ruptured cerebral aneurysm ([place]) 01/30/2020    Past Medical History: Past Medical History:  Diagnosis Date  . Prosthetic eye globe     Past Surgical History: Past Surgical History:  Procedure Laterality Date  . BURR HOLE N/A 03/18/2020   Procedure: [place] Flirt;  Surgeon: [name], MD;  Location: [place];  Service: Neurosurgery;  Laterality: N/A;  . CRANIOTOMY Left 01/30/2020   Procedure: LEFT FAR LATERAL CRANIOTOMY FOR ANEURYSM CLIPPING;  Surgeon: [name], MD;  Location: [place];  Service: Neurosurgery;  Laterality: Left;  . DIRECT LARYNGOSCOPY N/A 02/29/2020   Procedure: DIRECT LARYNGOSCOPY;  Surgeon: [name], MD;  Location: [place];  Service: ENT;  Laterality: N/A;  . IR ANGIO INTRA EXTRACRAN SEL INTERNAL CAROTID BILAT MOD SED  01/30/2020  . IR ANGIO VERTEBRAL SEL VERTEBRAL UNI L MOD SED  01/30/2020  . IR CM INJ ANY COLONIC TUBE W/FLUORO  07/03/2020  . IR GASTROSTOMY TUBE MOD SED  02/22/2020  . IR [place] TUBE PERCUT W/FLUORO  07/03/2020  .  LAPAROSCOPIC REVISION VENTRICULAR-PERITONEAL (V-P) SHUNT N/A 03/18/2020   Procedure: LAPAROSCOPIC REVISION VENTRICULAR-PERITONEAL (V-P) SHUNT;  Surgeon: [name], MD;  Location: [place];  Service: General;  Laterality: N/A;  . RADIOLOGY WITH ANESTHESIA N/A 01/30/2020   Procedure: IR WITH ANESTHESIA;  Surgeon: [name], MD;  Location: [place];  Service: Radiology;  Laterality: N/A;  . THYROIDECTOMY N/A 02/08/2020   Procedure: THYROIDECTOMY;  Surgeon: [name], MD;  Location: [place];  Service: ENT;  Laterality: N/A;  . TRACHEOSTOMY TUBE PLACEMENT N/A 02/08/2020   Procedure: TRACHEOSTOMY;  Surgeon: [name], MD;  Location: [place];  Service: ENT;  Laterality: N/A;  . TRACHEOSTOMY TUBE PLACEMENT N/A 02/29/2020   Procedure: TRACHEOSTOMY EXCHANGE;  Surgeon: [name], MD;  Location: [place];  Service: ENT;  Laterality: N/A;  . VENTRICULOPERITONEAL SHUNT N/A 03/18/2020   Procedure: RIGHT SHUNT INSERTION VENTRICULAR-PERITONEAL/ BURR HOLE Evacuation of Subdural Hematoma;  Surgeon: [name], MD;  Location: [place];  Service: Neurosurgery;  Laterality: N/A;    Social History: Social History   Tobacco Use  . Smoking status: Former Research scientist (life sciences)  . Smokeless tobacco: Never Used  Vaping Use  . Vaping Use: Never used  Substance Use Topics  . Alcohol use: Not Currently  . Drug use: Not Currently   Additional social history: Lives with daughter.  Please also refer to relevant sections of EMR.  Family History: History reviewed. No pertinent family history. HTN   Allergies and Medications: No Known Allergies No current facility-administered medications on file prior to encounter.   Current Outpatient Medications on File Prior to Encounter  Medication Sig Dispense Refill  . acetaminophen (TYLENOL) 325 MG tablet Place 2 tablets (650 mg total) into feeding tube every 6 (six) hours as needed for mild pain.    Marland Kitchen amLODipine (NORVASC) 10 MG tablet PLACE 1 TABLET (10 MG TOTAL) INTO  FEEDING TUBE DAILY. (Patient taking differently: Place 10 mg into feeding tube daily.) 90 tablet 1  . clonazePAM (KLONOPIN) 0.5 MG tablet Take 1 tablet (0.5 mg total) by mouth at bedtime as needed for anxiety. 30 tablet 1  . glycopyrrolate (ROBINUL) 1 MG tablet PLACE 2 TABLETS (2 MG TOTAL) INTO FEEDING TUBE TWO TIMES DAILY. (Patient taking differently: Place 2 mg into feeding tube 2 (two) times daily.) 60 tablet 0  . hydrALAZINE (APRESOLINE) 50 MG tablet Place 1 tablet (50 mg total) into feeding tube every 8 (eight) hours. 90 tablet 6  . labetalol (NORMODYNE) 300 MG tablet Place 1 tablet (300 mg total) into feeding tube 3 (three) times daily. 90 tablet 6  . levETIRAcetam (KEPPRA) 750 MG tablet Take 750 mg by mouth 2 (two) times daily.    Marland Kitchen levothyroxine (SYNTHROID) 100 MCG tablet Place 1 tablet (100 mcg total) into feeding tube daily at 6 (six) AM. 30 tablet 3  . metoCLOPramide (REGLAN) 5 MG tablet Place 1 tablet (5 mg total) into feeding tube 3 (three) times daily. 90 tablet 12  . Nutritional Supplements (FEEDING SUPPLEMENT, OSMOLITE 1.2 CAL,) LIQD Place 355 mLs into feeding tube 4 (four) times daily -  with meals and at bedtime.  0  . Nutritional Supplements (FEEDING SUPPLEMENT, PROSOURCE TF,) liquid Place 45 mLs into feeding tube 2 (two) times daily.    . QUEtiapine (SEROQUEL) 50 MG tablet PLACE 1 TABLET (50 MG TOTAL) INTO FEEDING TUBE 2 (TWO) TIMES DAILY. (Patient taking differently: Place 50 mg into feeding tube 2 (two) times daily.) 60 tablet 3  . scopolamine (TRANSDERM-SCOP) 1 MG/3DAYS PLACE 1 PATCH (1.5 MG TOTAL) ONTO THE SKIN EVERY 3 (THREE) DAYS. (Patient taking differently: Place 1 patch onto the skin every 3 (three) days.) 10 patch 12  . Water For Irrigation, Sterile (FREE WATER) SOLN Place 200 mLs into feeding tube every 6 (six) hours.      Objective: BP 133/88   Pulse 88   Temp 98 F (36.7 C) (Axillary)   Resp 16   SpO2 99%  Exam: General: Awake, alert, non-verbal, in no  distress, frequently trying to get up Eyes: Prosthetic left eye, right eye EOMI and sclera anicteric  ENTM: Nares patent, increased oral secretions, trach in place Neck: supple Cardiovascular: RRR without murmur Respiratory: Transmitted upper airway sounds, breathing comfortably on room air, no wheezing/rhonchi/rales Gastrointestinal: PEG tube in place without surrounding erythema  MSK: slight contractures b/l upper extremities  Derm: without edema, no lacerations or hematomas noted, warm and dry Neuro: Largely non-verbal but able to say 1-2 words though difficult to understand at time, able to follow commands, nods head in response to questions, able to lift all extremities against gravity, 2/5 strength b/l upper extremities, without facial asymmetry  Psych: Difficult to assess mood, appears restless and anxious   Labs and Imaging: CBC BMET  Recent Labs  Lab 08/26/20 1052 08/26/20 1115  WBC 5.7  --   HGB 11.4* 11.9*  HCT 36.9 35.0*  PLT 162  --    Recent Labs  Lab 08/26/20 1052 08/26/20 1115  NA 137 141  K 4.5 4.6  CL 101 102  CO2 29  --   BUN 16 22  CREATININE 0.72 0.50  GLUCOSE 201* 194*  CALCIUM 9.6  --      EKG: Sinus rhythm rate 81  DG Shoulder Right  Result Date: 08/26/2020 CLINICAL DATA:  Right shoulder pain after fall today. EXAM: RIGHT SHOULDER - 2+ VIEW COMPARISON:  None. FINDINGS: There is no evidence of fracture or dislocation. There is no evidence of arthropathy or other focal bone abnormality. Soft tissues are unremarkable. IMPRESSION: Negative. Electronically Signed   By: Marijo Conception M.D.   On: 08/26/2020 15:56   DG Forearm Left  Result Date: 08/26/2020 CLINICAL DATA:  Fall, left wrist and forearm pain EXAM: LEFT FOREARM - 2 VIEW; LEFT WRIST - COMPLETE 3+ VIEW COMPARISON:  None. FINDINGS: No evidence of acute fracture or dislocation involving the left forearm or left wrist. Lunotriquetral coalition noted. Mild osteoarthritis of the first CMC and  triscaphe joints. IV tubing is present at the ulnar aspect of the wrist. No focal soft tissue swelling. IMPRESSION: 1. No evidence of acute fracture or dislocation involving the left forearm or left wrist. 2. Lunotriquetral coalition. Electronically Signed   By: Davina Poke D.O.   On: 08/26/2020 14:20   DG Wrist 2 Views Right  Result Date: 08/26/2020 CLINICAL DATA:  Right wrist pain after fall. EXAM: RIGHT WRIST - 2 VIEW COMPARISON:  None. FINDINGS: There is no evidence of fracture or dislocation. There is no evidence of arthropathy or other focal bone abnormality. Soft tissues are unremarkable. IMPRESSION: Negative. Electronically Signed   By: Marijo Conception M.D.   On: 08/26/2020 15:56   DG Wrist Complete Left  Result Date: 08/26/2020 CLINICAL DATA:  Fall, left wrist and forearm pain EXAM: LEFT FOREARM - 2 VIEW; LEFT WRIST - COMPLETE 3+ VIEW COMPARISON:  None. FINDINGS: No evidence of acute fracture or dislocation involving the left forearm or left wrist. 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 IV tubing is present at the ulnar aspect of the wrist. No focal soft tissue swelling. IMPRESSION: 1. No evidence of acute fracture or dislocation involving the left forearm or left wrist. 2. Lunotriquetral coalition. Electronically Signed   By: Davina Poke D.O.   On: 08/26/2020 14:20   CT Cervical Spine Wo Contrast  Result Date: 08/26/2020 CLINICAL DATA:  Fall down stairs EXAM: CT CERVICAL SPINE WITHOUT CONTRAST TECHNIQUE: Multidetector CT imaging of the cervical spine was performed without intravenous contrast. Multiplanar CT image reconstructions were also generated. COMPARISON:  None. FINDINGS: Alignment: Mild retrolisthesis at C4-C5 and C5-C6. Skull base and vertebrae: Degenerative plate irregularity at C4-C5 and C5-C6. Vertebral body heights are otherwise maintained. No acute cervical spine fracture. Probable prior partial resection of the  posterior arch of C1. Soft tissues and spinal canal: No prevertebral fluid or swelling. No visible canal hematoma. Disc levels: Multilevel degenerative changes are present including disc space narrowing, endplate osteophytes, and facet and uncovertebral hypertrophy. These changes are greatest at C4-C5 and C5-C6. Upper chest: No apical lung mass.  Tracheostomy device is present. Other: Unremarkable. IMPRESSION: No acute cervical  spine fracture. Electronically Signed   By: Macy Mis M.D.   On: 08/26/2020 11:31   DG Pelvis Portable  Result Date: 08/26/2020 CLINICAL DATA:  Fall. EXAM: PORTABLE PELVIS 1-2 VIEWS COMPARISON:  None. FINDINGS: There is no evidence of pelvic fracture or diastasis. No pelvic bone lesions are seen. Calcified uterine fibroid is noted. IMPRESSION: No acute abnormality seen. Electronically Signed   By: Marijo Conception M.D.   On: 08/26/2020 12:25   DG Hand 2 View Right  Result Date: 08/26/2020 CLINICAL DATA:  Right hand pain after fall. EXAM: RIGHT HAND - 2 VIEW COMPARISON:  None. FINDINGS: There is no evidence of fracture or dislocation. There is no evidence of arthropathy or other focal bone abnormality. Soft tissues are unremarkable. IMPRESSION: Negative. Electronically Signed   By: Marijo Conception M.D.   On: 08/26/2020 15:54   DG Chest Portable 1 View  Result Date: 08/26/2020 CLINICAL DATA:  Fall.  Code stroke. EXAM: PORTABLE CHEST 1 VIEW COMPARISON:  07/30/2020 FINDINGS: Tracheostomy in place. 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 Heart size upper limits of normal. Mediastinal shadows otherwise unremarkable. The lungs are clear. No edema or effusions. IMPRESSION: No active disease. Electronically Signed   By: Nelson Chimes M.D.   On: 08/26/2020 11:43   DG Humerus Left  Result Date: 08/26/2020 CLINICAL DATA:  Fall. EXAM: LEFT HUMERUS - 2+ VIEW COMPARISON:  None. FINDINGS: There is no evidence of fracture or other focal bone lesions. Soft tissues are unremarkable. IMPRESSION:  Negative. Electronically Signed   By: Marijo Conception M.D.   On: 08/26/2020 11:45   DG Humerus Right  Result Date: 08/26/2020 CLINICAL DATA:  Right arm pain after fall. EXAM: RIGHT HUMERUS - 2+ VIEW COMPARISON:  None. FINDINGS: There is no evidence of fracture or other focal bone lesions. Soft tissues are unremarkable. IMPRESSION: Negative. Electronically Signed   By: Marijo Conception M.D.   On: 08/26/2020 15:55   DG Femur Portable Min 2 Views Left  Result Date: 08/26/2020 CLINICAL DATA:  Fall. EXAM: LEFT FEMUR PORTABLE 2 VIEWS COMPARISON:  None. FINDINGS: There is no evidence of fracture or other focal bone lesions. Soft tissues are unremarkable. IMPRESSION: Negative. Electronically Signed   By: Marijo Conception M.D.   On: 08/26/2020 11:44   CT HEAD CODE STROKE WO CONTRAST  Result Date: 08/26/2020 CLINICAL DATA:  Code stroke. 65 year old female with history of left PICA aneurysm clipping, subarachnoid and subdural hemorrhage. EXAM: CT HEAD WITHOUT CONTRAST TECHNIQUE: Contiguous axial images were obtained from the base of the skull through the vertex without intravenous contrast. COMPARISON:  Head CT 07/30/2020 and earlier. FINDINGS: Brain: Persistent bilateral subdural hematomas, mixed density on the left with mildly increased hyperdense blood products on that side since 07/30/2020 (series 2, image 16). Trace para falcine hyperdense blood has also increased on image 21. The left-side subdural is mildly lobulated and measures up to 12 mm thickness, with volume of blood increased along the anterior left frontal convexity. Stable mostly low-density right subdural volume, with hematoma on that side measuring 9-10 mm in thickness. Rightward midline shift has increased from 4 mm to 8 mm. Mass effect on the ventricles has increased. A right superior frontal approach ventriculostomy catheter appears stable and in communication with the right lateral ventricle. No ventriculomegaly. Basilar cisterns are stable. No  superimposed No cortically based acute infarct identified. Stable gray-white matter differentiation throughout the brain. There is some chronic right frontal lobe encephalomalacia. Vascular: Mild Calcified atherosclerosis  at the skull base. Aneurysm clip at the left pre medullary cistern is stable. Skull: Stable left suboccipital, foramen magnum craniotomy and partial C1 ring resection. No acute osseous abnormality identified. Sinuses/Orbits: Visualized paranasal sinuses and mastoids are stable and well aerated. Other: Retained secretions in the nasopharynx similar to the earlier this month. Prior resection of the left globe. Negative right orbit. 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 Stable right superior frontal vertex shunt reservoir with tubing tracking to the posterior right neck. ASPECTS [facility] Stroke Program Early CT Score) Total score (0-10 with 10 being normal): 10 IMPRESSION: 1. Acute on chronic left side subdural hematoma, increased since 07/30/2020 with new hyperdense blood products, and increased intracranial mass effect with rightward midline shift of the anterior septum pellucidum now 8 mm (previously 4 mm). Stable smaller right side subdural hematoma. 2. No acute cortically based infarct identified. 3. Stable right superior frontal approach ventriculostomy catheter with no ventriculomegaly. Stable postoperative changes including small left suboccipital pseudomeningocele. 4. These results were communicated to Dr. Curly Shores at 11:23 am on 08/26/2020 by text page via the [facility] messaging system. Electronically Signed   By: Genevie Ann M.D.   On: 08/26/2020 11:25     Sharion Settler, DO 08/26/2020, 4:31 PM PGY-1, [place] Intern pager: [phone], text pages welcome  FPTS Upper-Level Resident Addendum   I have independently interviewed and examined the patient. I have discussed the above with the original  author and agree with their documentation.Please see also any attending notes.   Gifford Shave, MD PGY-2, [place] 08/26/2020 5:09 PM  [facility] Service pager: [phone] (text pages welcome through [facility])

## 2020-08-26 NOTE — ED Triage Notes (Signed)
Shawn PA seen pt in triage, Left arm weakness and Left side facial droop. LKW 1002 this am  Code stroke activated in triage

## 2020-08-26 NOTE — Consult Note (Signed)
Neurology Consultation  Reason for Consult: Left arm weakness Referring Physician: Dr. Reather Converse  CC: Left upper extremity pain s/p fall down stairs  History is obtained from: Patient's daughter at bedside, chart review  HPI: Stacy Moran is a 65 y.o. 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 Patient's daughter states that she cares for her mother at home and that she last saw her mother at baseline at approximately 05:30 this morning. She states that at 09:00, her family members heard Stacy Moran fall down a flight of stairs into the basement. Stacy Moran's daughter states that her mother did not lost consciousness and initially denied any pain. She states that there are approximately 13 stairs to the basement but she does not believe that her mother fell down all of them. The fall was not witnessed. When attempting to help her mother off of the floor, she noticed that she was weak in both arms and legs. She states that her mother then complained of left upper arm pain when assisted up so EMS was activated. Family and patient are unable to identify whether Stacy Moran had a seizure at the time of her fall this morning but has most recently been seen on 07/30/2020 for tonic-clonic seizure activity.   At baseline Stacy Moran requires 24 hour care by her daughter. She ambulates unsteadily with a walker and sometimes uses a wheelchair. She is unable to manage her own  medications, feed herself, or complete any ADLs without assistance. She is not usually oriented to time, year, age, month, or situation. She is able to identify certain people and communicate her needs with her daughter and is usually able to answer yes / no questions appropriately.  LKW: 05:30 tpa given?: no, recent fall down stairs, recent history of ICH and multiple SDH IR Thrombectomy? No, presentation not consistent with LVO Modified Rankin Scale: 5-Severe disability-bedridden, incontinent, needs constant attention  ROS: Unable to obtain due to altered mental status.   Past Medical History:  Diagnosis Date  . Prosthetic eye globe   Subarachnoid hemorrhage s/p ventriculostomy 01/30/2020 Subdural hematoma 02/12/2020 s/p burr hole evacuation 03/18/2020 Craniotomy for PICA aneurysm clipping 01/31/2020 Tracheotomy 02/22/2020 Thyroidectomy 02/08/2020 PEG placement 02/22/2020 Hydrocephalus s/p VP shunt placement 03/18/2020  Past Surgical History:  Procedure Laterality Date  . BURR HOLE N/A 03/18/2020   Procedure: Haskell Flirt;  Surgeon: [name], MD;  Location: [place];  Service: Neurosurgery;  Laterality: N/A;  . CRANIOTOMY Left 01/30/2020   Procedure: LEFT FAR LATERAL CRANIOTOMY FOR ANEURYSM CLIPPING;  Surgeon: [name], MD;  Location: [place];  Service: Neurosurgery;  Laterality: Left;  . DIRECT LARYNGOSCOPY N/A 02/29/2020   Procedure: DIRECT LARYNGOSCOPY;  Surgeon: [name], MD;  Location: [place];  Service: ENT;  Laterality: N/A;  . IR ANGIO INTRA EXTRACRAN SEL INTERNAL CAROTID BILAT MOD SED  01/30/2020  . IR ANGIO VERTEBRAL SEL VERTEBRAL UNI L MOD SED  01/30/2020  . IR CM INJ ANY COLONIC TUBE W/FLUORO  07/03/2020  . IR GASTROSTOMY TUBE MOD SED  02/22/2020  . IR REPLC  GASTRO/COLONIC TUBE PERCUT W/FLUORO  07/03/2020  . LAPAROSCOPIC REVISION VENTRICULAR-PERITONEAL (V-P) SHUNT N/A 03/18/2020   Procedure: LAPAROSCOPIC REVISION VENTRICULAR-PERITONEAL (V-P) SHUNT;  Surgeon:  [name], MD;  Location: [place];  Service: General;  Laterality: N/A;  . RADIOLOGY WITH ANESTHESIA N/A 01/30/2020   Procedure: IR WITH ANESTHESIA;  Surgeon: [name], MD;  Location: [place];  Service: Radiology;  Laterality: N/A;  . THYROIDECTOMY N/A 02/08/2020   Procedure: THYROIDECTOMY;  Surgeon: [name], MD;  Location: [place];  Service: ENT;  Laterality: N/A;  . TRACHEOSTOMY TUBE PLACEMENT N/A 02/08/2020   Procedure: TRACHEOSTOMY;  Surgeon: [name], MD;  Location: [place];  Service: ENT;  Laterality: N/A;  . TRACHEOSTOMY TUBE PLACEMENT N/A 02/29/2020   Procedure: TRACHEOSTOMY EXCHANGE;  Surgeon: [name], MD;  Location: [place];  Service: ENT;  Laterality: N/A;  . VENTRICULOPERITONEAL SHUNT N/A 03/18/2020   Procedure: RIGHT SHUNT INSERTION VENTRICULAR-PERITONEAL/ BURR HOLE Evacuation of Subdural Hematoma;  Surgeon: [name], MD;  Location: [place];  Service: Neurosurgery;  Laterality: N/A;   History reviewed. No pertinent family history.  Social History:   reports that she has quit smoking. She has never used smokeless tobacco. She reports previous alcohol use. She reports previous drug use. Medications  Current Facility-Administered Medications:  .  clevidipine (CLEVIPREX) infusion 0.5 mg/mL, 0-21 mg/hr, Intravenous, Continuous, Deontay Ladnier L, MD .  levETIRAcetam (KEPPRA) IVPB 1000 mg/100 mL premix, 1,000 mg, Intravenous, Once, Aritzel Krusemark L, MD .  sodium chloride flush (NS) 0.9 % injection 3 mL, 3 mL, Intravenous, Once, Elnora Morrison, MD  Current Outpatient Medications:  .  acetaminophen (TYLENOL) 325 MG tablet, Place 2 tablets (650 mg total) into feeding tube every 6 (six) hours as needed for mild pain., Disp: , Rfl:  .  amLODipine (NORVASC) 10 MG tablet, PLACE 1 TABLET (10 MG TOTAL) INTO FEEDING TUBE DAILY., Disp: 90 tablet, Rfl: 1 .  clonazePAM (KLONOPIN) 0.5 MG tablet, Take 1 tablet (0.5 mg total) by mouth at bedtime as needed for anxiety.,  Disp: 30 tablet, Rfl: 1 .  glycopyrrolate (ROBINUL) 1 MG tablet, PLACE 2 TABLETS (2 MG TOTAL) INTO FEEDING TUBE TWO TIMES DAILY. (Patient not taking: Reported on 07/31/2020), Disp: 60 tablet, Rfl: 0 .  hydrALAZINE (APRESOLINE) 50 MG tablet, Place 1 tablet (50 mg total) into feeding tube every 8 (eight) hours., Disp: 90 tablet, Rfl: 6 .  labetalol (NORMODYNE) 300 MG tablet, Place 1 tablet (300 mg total) into feeding tube 3 (three) times daily., Disp: 90 tablet, Rfl: 6 .  levETIRAcetam (KEPPRA) 750 MG tablet, Take 750 mg by mouth 2 (two) times daily., Disp: , Rfl:  .  levothyroxine (SYNTHROID) 100 MCG tablet, Place 1 tablet (100 mcg total) into feeding tube daily at 6 (six) AM., Disp: 30 tablet, Rfl: 3 .  metoCLOPramide (REGLAN) 5 MG tablet, Place 1 tablet (5 mg total) into feeding tube 3 (three) times daily., Disp: 90 tablet, Rfl: 12 .  Nutritional Supplements (FEEDING SUPPLEMENT, OSMOLITE 1.2 CAL,) LIQD, Place 355 mLs into feeding tube 4 (four) times daily -  with meals and at bedtime., Disp: , Rfl: 0 .  Nutritional Supplements (FEEDING SUPPLEMENT, PROSOURCE TF,) liquid, Place 45 mLs into feeding tube 2 (two) times daily., Disp: , Rfl:  .  QUEtiapine (SEROQUEL) 50 MG tablet, PLACE 1 TABLET (50 MG TOTAL) INTO FEEDING TUBE 2 (TWO) TIMES DAILY., Disp: 60 tablet, Rfl: 3 .  scopolamine (TRANSDERM-SCOP) 1 MG/3DAYS, PLACE 1 PATCH (1.5 MG TOTAL) ONTO THE SKIN EVERY 3 (  THREE) DAYS., Disp: 10 patch, Rfl: 12 .  Water For Irrigation, Sterile (FREE WATER) SOLN, Place 200 mLs into feeding tube every 6 (six) hours., Disp: , Rfl:   Exam: Current vital signs: BP (!) 146/116   Pulse (!) 101   Temp 98.3 F (36.8 C) (Oral)   Resp (!) 22   SpO2 98%  Vital signs in last 24 hours: Temp:  [98.3 F (36.8 C)] 98.3 F (36.8 C) (05/31 1042) Pulse Rate:  [101] 101 (05/31 1042) Resp:  [22] 22 (05/31 1042) BP: (146)/(116) 146/116 (05/31 1042) SpO2:  [98 %] 98 % (05/31 1042)  GENERAL: Awake, alert, appears to be in pain  with movement Head: Normocephalic without obvious lacerations or abrasions EENT: Tracheostomy secured at midline neck, there is a moderate amount of thick, clear, oral secretions present at the mouth LUNGS: Normal respiratory effort with non-labored breathing. Tracheostomy in place with passy muir device in place. CV: Tachycardic on cardiac monitor, extremities warm without edema ABDOMEN: Soft, non-tender, PEG in place Ext: warm, without obvious abnormality  NEURO:  Mental Status: Awake, alert, and oriented to self. She is able to state that she is in the hospital and that she fell but she remains confused (baseline) and states that she did not fall this morning. She states incorrectly that she is 65 years old, then corrects herself to incorrectly state that she is 65 years old and reports that the year is 2002.  Speech/Language: speech is limited due to tracheostomy with passy muir device in place. She mumbles frequently and is not fluent in her speech. She is able to name objects and answer yes / no questions appropriately. Poor attention is noted.  Cranial Nerves:  II: Right pupil is 4 mm and briskly reactive to light, left pupil is prosthetic.  III, IV, VI: EOMI on the right without ptosis- left eye is prosthetic V: Sensation is intact to light touch and symmetrical to face.  VII: Face is asymmetric with right mouth droop (baseline per daughter) VIII: Hearing is intact to voice IX, X: Unable to assess- patient mumbles and does not attempt to use vocal cords with communication XI: Able to shrug shoulders on the right with wincing with movement of left upper extremity XII: Tongue protrudes midline without fasciculations.   Motor: Bilateral lower extremity weakness present 4/5 without vertical drift. Bilateral upper extremity assessment waxes and wanes. Initially bilateral upper extremities are unable to resist gravity. On reassessment, the right upper extremity is able to overcome gravity  without vertical drift but the patient winces and refuses to lift the left upper extremity due to reports of pain since her fall.  Bulk is decreased, tone is normal. Sensation: Intact to light touch bilaterally in all four extremities. Coordination: Patient does not participate in coordination assessment.   DTRs: 2+ and symmetric biceps, brachioradialis, and patellae Gait: Deferred  NIHSS: 1a Level of Conscious.: 0 1b LOC Questions: 2 1c LOC Commands: 0 2 Best Gaze: 0 3 Visual: 0 (left eye prosthetic) 4 Facial Palsy: 1; baseline right mouth droop per daughter 5a Motor Arm - left:  2; (on reassessment does not attempt antigravity movement with LUE) 5b Motor Arm - Right: 2 ; (on reassessment, there was no drift) 6a Motor Leg - Left: 0 6b Motor Leg - Right: 0 7 Limb Ataxia: 0 8 Sensory: 0 9 Best Language: 0 10 Dysarthria: 1 11 Extinct. and Inatten.: 0 TOTAL: 8  Labs I have reviewed labs in epic and the results pertinent to  this consultation are: CBC    Component Value Date/Time   WBC 5.7 08/26/2020 1052   RBC 4.41 08/26/2020 1052   HGB 11.9 (L) 08/26/2020 1115   HGB 11.7 08/08/2020 1052   HCT 35.0 (L) 08/26/2020 1115   HCT 36.3 08/08/2020 1052   PLT 162 08/26/2020 1052   PLT 149 (L) 08/08/2020 1052   MCV 83.7 08/26/2020 1052   MCV 80 08/08/2020 1052   MCH 25.9 (L) 08/26/2020 1052   MCHC 30.9 08/26/2020 1052   RDW 14.0 08/26/2020 1052   RDW 14.0 08/08/2020 1052   LYMPHSABS 1.3 08/26/2020 1052   MONOABS 0.3 08/26/2020 1052   EOSABS 0.1 08/26/2020 1052   BASOSABS 0.0 08/26/2020 1052   CMP     Component Value Date/Time   NA 141 08/26/2020 1115   K 4.6 08/26/2020 1115   CL 102 08/26/2020 1115   CO2 28 07/30/2020 2154   GLUCOSE 194 (H) 08/26/2020 1115   BUN 22 08/26/2020 1115   CREATININE 0.50 08/26/2020 1115   CALCIUM 9.6 07/30/2020 2154   PROT 8.0 07/30/2020 2154   ALBUMIN 4.2 07/30/2020 2154   AST 26 07/30/2020 2154   ALT 29 07/30/2020 2154   ALKPHOS 64  07/30/2020 2154   BILITOT 0.2 (L) 07/30/2020 2154   GFRNONAA >60 07/30/2020 2154   Lipid Panel     Component Value Date/Time   TRIG 109 02/04/2020 0732   Imaging I have reviewed the images obtained: CT-scan of the brain: 1. Acute on chronic left side subdural hematoma, increased since 07/30/2020 with new hyperdense blood products, and increased intracranial mass effect with rightward midline shift of the anterior septum pellucidum now 8 mm (previously 4 mm). Stable smaller right side subdural hematoma. 2. No acute cortically based infarct identified. 3. Stable right superior frontal approach ventriculostomy catheter with no ventriculomegaly. Stable postoperative changes including small left suboccipital pseudomeningocele.  Assessment: 65 year old female with PMHx of SAH s/p PICA aneurysm clipping, bilateral SDH, seizures, chronic tracheostomy, and PEG who presented to the ED via EMS with generalized weakness and left arm pain / weakness s/p a fall down the stairs. CT Head revealed acute on chronic left sided SDH with increased mass effect and increased rightward midline shift.  - Examination reveals pain limited assessment of LUE, and right mouth droop with lack of orientation consistent with patient's baseline.  - CT with increased size of chronic left subdural hematoma with new hyperdense blood products, increased mass effect and increased rightward midline shift.  - Patient with history of seizures since ICH in November 2021 (most recently documented in early May 2022). Patient given 1 gram Keppra load and maintenance Keppra increased to 1,000 mg BID for seizure prophylaxis.  - Presentation most consistent with traumatic subdural hematoma with increased mass effect from fall down the stairs. MRI without contrast recommended for further evaluation when able to be obtained and when stabilized from a neurosurgery perspective. Will defer routine EEG at this time given low concern for ongoing  seizure activity but low threshold for EEG if she has significantly fluctuating mental status.   Impression: Acute on chronic left-sided subdural hematoma with mass effect and left-to-right midline shift increase s/p fall down stairs Stable right-sided subdural History of seizures s/p ICH on home Keppra Possible stroke versus pain limited left-sided weakness, there is a prior history of left > right sided weakness on chart review    Recommendations: - MRI brain without contrast when able to exclude stroke - Loaded with 1 gram of  Keppra; increase home Keppra dosing to 1,000 mg BID - No need for EEG at this time given low concern for ongoing seizure activity - Appreciate neurosurgery evaluation of acute on chronic SDH with recent fall - Neurology will follow up MRI but will otherwise be available on an as-needed basis going forward, please page if new questions or concerns arise  Anibal Henderson, AGAC-NP [facility] Pager: [phone]  Attending Neurologist's note:  I personally saw this patient, gathering history, performing a neurologic examination, reviewing relevant labs, personally reviewing relevant imaging including current and most recent head prior head CT and formulated the assessment and plan, adding the note above for completeness and clarity to accurately reflect my thoughts  Lesleigh Noe MD-PhD [facility] [phone] Available 7 AM to 7 PM, outside these hours please contact Neurologist on call listed on AMION

## 2020-08-26 NOTE — Progress Notes (Signed)
Pt should be followed up by neurosurgery post MRI due to shunts. RN Luvenia Starch is aware. This is to make sure that the shunt settings were not altered by the MRI machine. RN said she would let the doctor know.

## 2020-08-26 NOTE — ED Provider Notes (Signed)
[facility] EMERGENCY DEPARTMENT Provider Note   CSN: [id number] Arrival date & time: 08/26/20  1024  An emergency department physician performed an initial assessment on this suspected stroke patient at 1050.  History Chief Complaint  Patient presents with  . Code Stroke    Stacy Moran is a 65 y.o. female with history of SAH, subdural, tracheostomy.  Patient arrived through triage today following a fall down the stairs.  Found by her granddaughter around 9 AM this morning.  Patient's daughter who is at bedside notes new left-sided weakness since her fall this morning.  Patient was seen in triage, code stroke activated.  I met this patient at the CT scanner along with the code stroke team and Dr. Curly Shores.  Patient is sitting up in wheelchair without assistance patient with history of aphasia due to previous [place], majority of history was obtained from her daughter.  Patient's daughter also notes that patient was complaining of some left arm pain after the fall.  She feels that her mother is drooling more over the past week and that the left-sided weakness is new.  Level 5 caveat acuity of illness  HPI     Past Medical History:  Diagnosis Date  . Prosthetic eye globe     Patient Active Problem List   Diagnosis Date Noted  . Fall (on) (from) other stairs and steps, initial encounter 08/26/2020  . SDH (subdural hematoma) ([place]) 07/31/2020  . History of hemorrhagic cerebrovascular accident (CVA) with residual deficit 06/06/2020  . 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 S/P ventriculoperitoneal shunt 06/06/2020  . Functional urinary incontinence 06/06/2020  . Ineffective airway clearance   . Thrombocytopenia ([place])   . Restlessness   . Restlessness and agitation   . Acute blood loss anemia   . Essential hypertension   . Seizure prophylaxis   . Protein-calorie malnutrition, severe 04/24/2020  . Dysphagia, post-stroke   . Benign essential HTN   . Dysphagia    . Status post tracheostomy ([place])   . S/P percutaneous endoscopic gastrostomy (PEG) tube placement ([place])   . ICH (intracerebral hemorrhage) ([place]) 04/22/2020  . Obstructive hydrocephalus ([place])   . Seizure-like activity ([place]) 03/12/2020  . PEG (percutaneous endoscopic gastrostomy) status ([place]) 03/08/2020  . Hyperglycemia 03/08/2020  . Acute respiratory failure ([place])   . Ventilator dependence ([place])   . 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 Tracheostomy status ([place]) 02/19/2020  . Abdominal distention   . 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 SAH (subarachnoid hemorrhage) ([place])   . Subarachnoid bleed ([place])   . Tachypnea   . Prediabetes   . Hypokalemia   . Leukocytosis   . Ileus, postoperative ([place])   . Pressure injury of skin 02/13/2020  . 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 BURR HOLE N/A 03/18/2020   Procedure: [place] Flirt;  Surgeon: [name], MD;  Location: [facility];  Service: Neurosurgery;  Laterality: N/A;  . CRANIOTOMY Left 01/30/2020   Procedure: LEFT FAR LATERAL CRANIOTOMY FOR ANEURYSM CLIPPING;  Surgeon: [name], MD;  Location: [place] [facility];  Service: Neurosurgery;  Laterality: Left;  . DIRECT LARYNGOSCOPY N/A 02/29/2020   Procedure: DIRECT LARYNGOSCOPY;  Surgeon: [name], MD;  Location: [facility];  Service: ENT;  Laterality: N/A;  . IR ANGIO INTRA EXTRACRAN SEL INTERNAL CAROTID BILAT MOD SED  01/30/2020  . IR ANGIO VERTEBRAL SEL VERTEBRAL UNI L MOD SED  01/30/2020  . IR CM INJ ANY COLONIC TUBE W/FLUORO  07/03/2020  . IR GASTROSTOMY TUBE MOD SED  02/22/2020  . IR [place] TUBE PERCUT W/FLUORO  07/03/2020  . LAPAROSCOPIC REVISION VENTRICULAR-PERITONEAL (V-P) SHUNT N/A 03/18/2020   Procedure: LAPAROSCOPIC REVISION VENTRICULAR-PERITONEAL (V-P) SHUNT;  Surgeon: [name], MD;  Location: [facility];  Service: General;  Laterality: N/A;  .  RADIOLOGY WITH ANESTHESIA N/A 01/30/2020   Procedure: IR WITH ANESTHESIA;  Surgeon: [name], MD;  Location: [facility];  Service: Radiology;  Laterality: N/A;  . THYROIDECTOMY N/A 02/08/2020   Procedure: THYROIDECTOMY;  Surgeon: [name], MD;  Location: [facility];  Service: ENT;  Laterality: N/A;  . TRACHEOSTOMY TUBE PLACEMENT N/A 02/08/2020   Procedure: TRACHEOSTOMY;  Surgeon: [name], MD;  Location: [facility];  Service: ENT;  Laterality: N/A;  . TRACHEOSTOMY TUBE PLACEMENT N/A 02/29/2020   Procedure: TRACHEOSTOMY EXCHANGE;  Surgeon: [name], MD;  Location: [facility];  Service: ENT;  Laterality: N/A;  . VENTRICULOPERITONEAL SHUNT N/A 03/18/2020   Procedure: RIGHT SHUNT INSERTION VENTRICULAR-PERITONEAL/ BURR HOLE Evacuation of Subdural Hematoma;  Surgeon: [name], MD;  Location: [facility];  Service: Neurosurgery;  Laterality: N/A;     OB History   No obstetric history on file.     History reviewed. No pertinent family history.  Social History   Tobacco Use  . Smoking status: Former Research scientist (life sciences)  . Smokeless tobacco: Never Used  Vaping Use  . Vaping Use: Never used  Substance Use Topics  . Alcohol use: Not Currently  . Drug use: Not Currently    Home Medications Prior to Admission medications   Medication Sig Start Date End Date Taking? Authorizing Provider  acetaminophen (TYLENOL) 325 MG tablet Place 2 tablets (650 mg total) into feeding tube every 6 (six) hours as needed for mild pain. 05/20/20   Angiulli, [name], PA-C  amLODipine (NORVASC) 10 MG tablet PLACE 1 TABLET (10 MG TOTAL) INTO FEEDING TUBE DAILY. 08/10/20 08/10/21  [name], MD  clonazePAM (KLONOPIN) 0.5 MG tablet Take 1 tablet (0.5 mg total) by mouth at bedtime as needed for anxiety. 08/08/20   [name], MD  glycopyrrolate (ROBINUL) 1 MG tablet PLACE 2 TABLETS (2 MG TOTAL) INTO FEEDING TUBE TWO TIMES DAILY. Patient not taking: Reported on 07/31/2020 05/20/20 05/20/21  Angiulli, [name], PA-C   hydrALAZINE (APRESOLINE) 50 MG tablet Place 1 tablet (50 mg total) into feeding tube every 8 (eight) hours. 08/09/20   [name], MD  labetalol (NORMODYNE) 300 MG tablet Place 1 tablet (300 mg total) into feeding tube 3 (three) times daily. 08/10/20   [name], MD  levETIRAcetam (KEPPRA) 750 MG tablet Take 750 mg by mouth 2 (two) times daily. 07/31/20   [provider]  levothyroxine (SYNTHROID) 100 MCG tablet Place 1 tablet (100 mcg total) into feeding tube daily at 6 (six) AM. 07/16/20   [name], MD  metoCLOPramide (REGLAN) 5 MG tablet Place 1 tablet (5 mg total) into feeding tube 3 (three) times daily. 07/24/20 07/24/21  Erick Colace, NP  Nutritional Supplements (FEEDING SUPPLEMENT, OSMOLITE 1.2 CAL,) LIQD Place 355 mLs into feeding tube 4 (four) times daily -  with meals and at bedtime. 05/20/20   Angiulli, [name], PA-C  Nutritional Supplements (FEEDING SUPPLEMENT, PROSOURCE TF,) liquid Place 45 mLs into feeding tube 2 (two) times daily. 05/20/20   Angiulli, [name], PA-C  QUEtiapine (SEROQUEL) 50 MG tablet PLACE 1 TABLET (50 MG TOTAL)  INTO FEEDING TUBE 2 (TWO) TIMES DAILY. 07/29/20 07/29/21  Jamse Arn, MD  scopolamine (TRANSDERM-SCOP) 1 MG/3DAYS PLACE 1 PATCH (1.5 MG TOTAL) ONTO THE SKIN EVERY 3 (THREE) DAYS. 06/06/20 06/06/21  [name], MD  Water For Irrigation, Sterile (FREE WATER) SOLN Place 200 mLs into feeding tube every 6 (six) hours. 05/20/20   Angiulli, [name], PA-C    Allergies    Patient has no known allergies.  Review of Systems   Review of Systems  Unable to perform ROS: Patient nonverbal    Physical Exam Updated Vital Signs BP 122/90   Pulse 90   Temp 98.3 F (36.8 C) (Oral)   Resp 17   SpO2 99%   Physical Exam Constitutional:      General: She is not in acute distress.    Appearance: Normal appearance. She is well-developed. She is not ill-appearing or diaphoretic.  HENT:     Head: Normocephalic and atraumatic.      Jaw: There is normal jaw occlusion.  Eyes:     General: Vision grossly intact. Gaze aligned appropriately.     Pupils: Pupils are equal, round, and reactive to light.  Neck:     Trachea: Tracheostomy present.  Cardiovascular:     Rate and Rhythm: Normal rate and regular rhythm.  Pulmonary:     Effort: Pulmonary effort is normal. No respiratory distress.     Breath sounds: Normal air entry.  Chest:     Chest wall: No deformity or tenderness.  Abdominal:     General: There is no distension.     Palpations: Abdomen is soft.     Tenderness: There is no abdominal tenderness. There is no guarding or rebound.  Musculoskeletal:        General: Normal range of motion.     Cervical back: Normal range of motion. No spinous process tenderness.     Comments: Patient guarding left shoulder and left elbow.  All major joints of the bilateral upper extremities palpated without deformity or crepitus.  Capillary refill intact to fingers.  Strong equal radial pulses.  Compartments soft.  No tenderness palpation of the back chest or abdomen.  Pelvis stable to compression.  All major joints of the bilateral lower extremities palpated without deformity or crepitus.  Skin:    General: Skin is warm and dry.  Neurological:     Mental Status: She is alert.     GCS: GCS eye subscore is 4. GCS motor subscore is 4.     ED Results / Procedures / Treatments   Labs (all labs ordered are listed, but only abnormal results are displayed) Labs Reviewed  CBC - Abnormal; Notable for the following components:      Result Value   Hemoglobin 11.4 (*)    MCH 25.9 (*)    All other components within normal limits  COMPREHENSIVE METABOLIC PANEL - Abnormal; Notable for the following components:   Glucose, Bld 201 (*)    All other components within normal limits  I-STAT CHEM 8, ED - Abnormal; Notable for the following components:   Glucose, Bld 194 (*)    Hemoglobin 11.9 (*)    HCT 35.0 (*)    All other components  within normal limits  RESP PANEL BY RT-PCR (FLU A&B, COVID) ARPGX2  PROTIME-INR  APTT  DIFFERENTIAL  CBG MONITORING, ED    EKG None  Radiology DG Forearm Left  Result Date: 08/26/2020 CLINICAL DATA:  Fall, left wrist and forearm pain EXAM: LEFT  FOREARM - 2 VIEW; LEFT WRIST - COMPLETE 3+ VIEW COMPARISON:  None. FINDINGS: No evidence of acute fracture or dislocation involving the left forearm or left wrist. 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 IV tubing is present at the ulnar aspect of the wrist. No focal soft tissue swelling. IMPRESSION: 1. No evidence of acute fracture or dislocation involving the left forearm or left wrist. 2. Lunotriquetral coalition. Electronically Signed   By: Davina Poke D.O.   On: 08/26/2020 14:20   DG Wrist Complete Left  Result Date: 08/26/2020 CLINICAL DATA:  Fall, left wrist and forearm pain EXAM: LEFT FOREARM - 2 VIEW; LEFT WRIST - COMPLETE 3+ VIEW COMPARISON:  None. FINDINGS: No evidence of acute fracture or dislocation involving the left forearm or left wrist. 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 IV tubing is present at the ulnar aspect of the wrist. No focal soft tissue swelling. IMPRESSION: 1. No evidence of acute fracture or dislocation involving the left forearm or left wrist. 2. Lunotriquetral coalition. Electronically Signed   By: Davina Poke D.O.   On: 08/26/2020 14:20   CT Cervical Spine Wo Contrast  Result Date: 08/26/2020 CLINICAL DATA:  Fall down stairs EXAM: CT CERVICAL SPINE WITHOUT CONTRAST TECHNIQUE: Multidetector CT imaging of the cervical spine was performed without intravenous contrast. Multiplanar CT image reconstructions were also generated. COMPARISON:  None. FINDINGS: Alignment: Mild retrolisthesis at C4-C5 and C5-C6. Skull base and vertebrae: Degenerative plate irregularity at C4-C5 and C5-C6. Vertebral body heights are  otherwise maintained. No acute cervical spine fracture. Probable prior partial resection of the posterior arch of C1. Soft tissues and spinal canal: No prevertebral fluid or swelling. No visible canal hematoma. Disc levels: Multilevel degenerative changes are present including disc space narrowing, endplate osteophytes, and facet and uncovertebral hypertrophy. These changes are greatest at C4-C5 and C5-C6. Upper chest: No apical lung mass.  Tracheostomy device is present. Other: Unremarkable. IMPRESSION: No acute cervical spine fracture. Electronically Signed   By: Macy Mis M.D.   On: 08/26/2020 11:31   DG Pelvis Portable  Result Date: 08/26/2020 CLINICAL DATA:  Fall. EXAM: PORTABLE PELVIS 1-2 VIEWS COMPARISON:  None. FINDINGS: There is no evidence of pelvic fracture or diastasis. No pelvic bone lesions are seen. Calcified uterine fibroid is noted. IMPRESSION: No acute abnormality seen. Electronically Signed   By: Marijo Conception M.D.   On: 08/26/2020 12:25   DG Chest Portable 1 View  Result Date: 08/26/2020 CLINICAL DATA:  Fall.  Code stroke. EXAM: PORTABLE CHEST 1 VIEW COMPARISON:  07/30/2020 FINDINGS: Tracheostomy in place. 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 Heart size upper limits of normal. Mediastinal shadows otherwise unremarkable. The lungs are clear. No edema or effusions. IMPRESSION: No active disease. Electronically Signed   By: Nelson Chimes M.D.   On: 08/26/2020 11:43   DG Humerus Left  Result Date: 08/26/2020 CLINICAL DATA:  Fall. EXAM: LEFT HUMERUS - 2+ VIEW COMPARISON:  None. FINDINGS: There is no evidence of fracture or other focal bone lesions. Soft tissues are unremarkable. IMPRESSION: Negative. Electronically Signed   By: Marijo Conception M.D.   On: 08/26/2020 11:45   DG Femur Portable Min 2 Views Left  Result Date: 08/26/2020 CLINICAL DATA:  Fall. EXAM: LEFT FEMUR PORTABLE 2 VIEWS COMPARISON:  None. FINDINGS: There is no evidence of fracture or other focal bone lesions.  Soft tissues are unremarkable. IMPRESSION: Negative. Electronically Signed   By: Jeneen Rinks  Murlean Caller M.D.   On: 08/26/2020 11:44   CT HEAD CODE STROKE WO CONTRAST  Result Date: 08/26/2020 CLINICAL DATA:  Code stroke. 65 year old female with history of left PICA aneurysm clipping, subarachnoid and subdural hemorrhage. EXAM: CT HEAD WITHOUT CONTRAST TECHNIQUE: Contiguous axial images were obtained from the base of the skull through the vertex without intravenous contrast. COMPARISON:  Head CT 07/30/2020 and earlier. FINDINGS: Brain: Persistent bilateral subdural hematomas, mixed density on the left with mildly increased hyperdense blood products on that side since 07/30/2020 (series 2, image 16). Trace para falcine hyperdense blood has also increased on image 21. The left-side subdural is mildly lobulated and measures up to 12 mm thickness, with volume of blood increased along the anterior left frontal convexity. Stable mostly low-density right subdural volume, with hematoma on that side measuring 9-10 mm in thickness. Rightward midline shift has increased from 4 mm to 8 mm. Mass effect on the ventricles has increased. A right superior frontal approach ventriculostomy catheter appears stable and in communication with the right lateral ventricle. No ventriculomegaly. Basilar cisterns are stable. No superimposed No cortically based acute infarct identified. Stable gray-white matter differentiation throughout the brain. There is some chronic right frontal lobe encephalomalacia. Vascular: Mild Calcified atherosclerosis at the skull base. Aneurysm clip at the left pre medullary cistern is stable. Skull: Stable left suboccipital, foramen magnum craniotomy and partial C1 ring resection. No acute osseous abnormality identified. Sinuses/Orbits: Visualized paranasal sinuses and mastoids are stable and well aerated. Other: Retained secretions in the nasopharynx similar to the earlier this month. Prior resection of the left  globe. Negative right orbit. 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 Stable right superior frontal vertex shunt reservoir with tubing tracking to the posterior right neck. ASPECTS [facility] Stroke Program Early CT Score) Total score (0-10 with 10 being normal): 10 IMPRESSION: 1. Acute on chronic left side subdural hematoma, increased since 07/30/2020 with new hyperdense blood products, and increased intracranial mass effect with rightward midline shift of the anterior septum pellucidum now 8 mm (previously 4 mm). Stable smaller right side subdural hematoma. 2. No acute cortically based infarct identified. 3. Stable right superior frontal approach ventriculostomy catheter with no ventriculomegaly. Stable postoperative changes including small left suboccipital pseudomeningocele. 4. These results were communicated to Dr. Curly Shores at 11:23 am on 08/26/2020 by text page via the [facility] messaging system. Electronically Signed   By: Genevie Ann M.D.   On: 08/26/2020 11:25    Procedures .Critical Care Performed by: Deliah Boston, PA-C Authorized by: Deliah Boston, PA-C   Critical care provider statement:    Critical care time (minutes):  35   Critical care was necessary to treat or prevent imminent or life-threatening deterioration of the following conditions:  CNS failure or compromise   Critical care was time spent personally by me on the following activities:  Evaluation of patient's response to treatment, examination of patient, ordering and performing treatments and interventions, ordering and review of laboratory studies, ordering and review of radiographic studies, pulse oximetry, re-evaluation of patient's condition, obtaining history from patient or surrogate, review of old charts, development of treatment plan with patient or surrogate and discussions with consultants     Medications Ordered in ED Medications  sodium chloride flush  (NS) 0.9 % injection 3 mL (3 mLs Intravenous Not Given 08/26/20 1120)  clevidipine (CLEVIPREX) infusion 0.5 mg/mL (0 mg/hr Intravenous Stopped 08/26/20 1250)  levETIRAcetam (KEPPRA) IVPB 1000 mg/100 mL premix (has no administration in time  range)  LORazepam (ATIVAN) injection 1 mg (has no administration in time range)  hydrALAZINE (APRESOLINE) tablet 50 mg (has no administration in time range)  labetalol (NORMODYNE) tablet 300 mg (has no administration in time range)  amLODipine (NORVASC) tablet 10 mg (has no administration in time range)  QUEtiapine (SEROQUEL) tablet 50 mg (has no administration in time range)  levothyroxine (SYNTHROID) tablet 100 mcg (has no administration in time range)  scopolamine (TRANSDERM-SCOP) 1 MG/3DAYS 1.5 mg (has no administration in time range)  free water 200 mL (has no administration in time range)  feeding supplement (OSMOLITE 1.2 CAL) liquid 355 mL (has no administration in time range)  feeding supplement (PROSource TF) liquid 45 mL (has no administration in time range)  levETIRAcetam (KEPPRA) IVPB 1000 mg/100 mL premix (0 mg Intravenous Stopped 08/26/20 1213)    ED Course  I have reviewed the triage vital signs and the nursing notes.  Pertinent labs & imaging results that were available during my care of the patient were reviewed by me and considered in my medical decision making (see chart for details).     MDM Rules/Calculators/A&P                         Additional history obtained from: Nursing notes from this visit. Review of electronic medical records. Family member, daughter at bedside. ============= 66 year old female history of SAH, subdural last year with shunt in place, tracheostomy tube in place.  Patient had a fall down 3 steps today around 2 hours prior to arrival landing on her left side.  Patient guarding her left arm on exam.  Family was concerned for decreased movement of the left arm, in triage code stroke was activated.  Patient was  met at the [facility] by myself and the neurology team.  ABCs intact.  CT head revealed new bleed on the left side, Dr. Curly Shores neurology ordered clevidipine and Keppra and asked that I page neurosurgery.  At 11:32 AM I spoke with the OR team, Dr. Kathyrn Sheriff was in surgery and will call back.  I spoke with neurosurgeon Dr. Kathyrn Sheriff at 12:08 PM who advised he will consult on the patient to adjust shunt however CT findings did not necessarily require admission.  I reassessed the patient, daughter at bedside, vital signs are stable, no acute distress.  Continued left arm pain, patient does not appear safe for discharge back to her home given fall, new difficulty moving left side of her body.  Shared decision making made with patient daughter and feel patient will benefit from an admission to the hospital.  X-ray of the chest pelvis and left shoulder/humerus were negative.  Patient does appear now to be guarding her left wrist more I have added x-rays of that area.  I then consulted with family medicine team and they will be by to evaluate the patient. - Patient seen and evaluated by family medicine, patient accepted for admission.  Patient reevaluated resting comfortably family at bedside they state understanding of care plan they have no questions or concerns at this time --------------------- I ordered, reviewed and interpreted labs which include: CBC, PT/INR, APTT, CMP, i-STAT Chem-8, COVID/influenza panel.  No emergent findings   Note: Portions of this report may have been transcribed using voice recognition software. Every effort was made to ensure accuracy; however, inadvertent computerized transcription errors may still be present.  Final Clinical Impression(s) / ED Diagnoses Final diagnoses:  Subdural hematoma ([place])    Rx / DC  Orders ED Discharge Orders    None       Gari Crown 08/26/20 1448    Elnora Morrison, MD 08/26/20 1534

## 2020-08-26 NOTE — ED Notes (Signed)
Report given to Mike, RN

## 2020-08-26 NOTE — Hospital Course (Addendum)
Stacy Moran is a 65 y.o. female presenting s/p fall admitted for increased weakness and pain since fall this morning. 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  Fall 2/2 Unsteady Gait  In ED had x-rays performed of left and right upper extremities without acute findings or fracture. PT/OT evaluated patient during admission and recommended SNF placement. Family is no longer able to care for patient at home and were agreeable with SNF placement. Patient did require safety sitter initally and safety restraints due to decreased staffing and high fall risk. Patient was able to remain off restraints for 3 weeks prior to discharge.  Delirium resolved and patient alert and oriented x4 by time of discharge.  Acute on Chronic Subdural Hematoma  Initially, there was concern for stroke in ED and code stroke was called. Head CT concerning for acute on chronic SDH.  Neurosurgery was contacted in ED.  Neurology felt findings were less suggestive of stroke. MRI brain with unchanged subdural hematomas and trace rightward midline shift. CT head was repeated due to concern for increased agitation, which showed unchanged hematomas and slightly decreased mass effect on the ventricles with 18mm rightward midline shift. Strata valve was adjusted to 2.0 (previously 1.5) and neurology signed-off.  PEG tube dependent  Trach Collar  Dysphagia Patient was working with speech therapy with minimal improvement and recommendation for ENT consultation. ENT was consulted, as she was being followed in the outpatient setting by Dr. Constance Holster. While hospitalized, ENT performed direct laryngoscopy with esophagascopy on 10/03/20 and fiberoptic laryngoscopy on 10/04/20 with recommendations for no surgical intervention at this time and to continue PEG tube feeds in the mean time. Modified barium swallow study performed 7/12,  demonstrated improved dysphagia. Recommend continued NPO but continued work with speech therapy given improvement.   Goals of Care  SNF Placement  Family unable to continue caring for patient at home given need for supervision. Patient was started on subcutaneous heparin for VTE prophylaxis. Palliative care was consulted for goals of care discussion and symptom management. After discussions with palliative medicine, patient code status was initially made DNR by daughter.  Once patient delirium resolved indicated she no longer wished to be DNR and wanted to be full code.  Palliative care to follow-up outpatient.  HTN BP's increased in ED up to 161'W systolic, 960'A diastolic. Briefly on clevidipine drip in ED for stabilization but able to be transitioned back to her home Amlodipine. Patient remained stable on Amlodipine for remainder of hospitalization. Stopped Hydralazine and Labetalol on discharge from hospital.   Hx Seizure  Although no witnessed seizure was given 1g Keppra bolus in ED and home Keppra increased to 1000 mg daily. No seizures occurred during hospitalization.   All other chronic conditions remained stable during hospitalization.   Issues for follow-up: Follow-up with neurology outpatient Robinol was discontinued and patient placed on mucinex for secretions, continue to monitor appropriateness. Follow up with your ENT doctor in 1 month. Chronic secretions with Trach collar-  Continue to monitor for worsening or improvement Modified barium swallow performed by SLP on day of discharge.  Continue working with Speech therapy and monitor improvement.

## 2020-08-26 NOTE — ED Notes (Signed)
Spoke to [place] at [facility] to activate code stroke per [employer]

## 2020-08-26 NOTE — ED Notes (Signed)
Patient transported to MRI 

## 2020-08-26 NOTE — ED Provider Notes (Addendum)
Emergency Medicine Provider Triage Evaluation Note  Stacy Moran , a 65 y.o. female  was evaluated in triage.  Pt complains of fall. I noted left sided weakness and left-sided facial droop upon my assessment in triage.  Patient indicates this is new for her.  I spoke with the paramedics who cared for the patient and transported her.  She states her grip strengths were equal bilaterally upon their assessment at 10:02 AM this morning.  Also complains of left arm pain  Review of Systems  Positive: Left-sided facial droop, left arm weakness, right arm pain Negative: LOC, vomiting, chest pain  Physical Exam  BP (!) 146/116   Pulse (!) 101   Temp 98.3 F (36.8 C) (Oral)   Resp (!) 22   SpO2 98%  Gen:   Awake, no distress   Resp:  Normal effort  MSK:    Other:  Left-sided facial droop, left arm weakness  Medical Decision Making  Medically screening exam initiated at 10:43 AM.  Appropriate orders placed.  Stacy Moran was informed that the remainder of the evaluation will be completed by another provider, this initial triage assessment does not replace that evaluation, and the importance of remaining in the ED until their evaluation is complete.  Code stroke activated in triage  10:55 AM Daughter confirms the left sided weakness is new   Lorayne Bender, PA-C 08/26/20 1057    Lorayne Bender, PA-C 08/26/20 1058    Isla Pence, MD 08/26/20 1308

## 2020-08-26 NOTE — Telephone Encounter (Signed)
Fax received from [facility] noting that they are unable to process the referral because the patient is enrolled in a managed care plan.  Call placed to [facility], spoke to [place] who stated that the patient is enrolled in [employer].  PCS then faxed to [facility] # [phone]

## 2020-08-26 NOTE — ED Triage Notes (Signed)
Pt BIB [facility] from home, pt fell down 3-4 carpeted steps to the basements. did not hit head, denies LOC, N/V. A/O x4 Pt with chronic trach from previous aneurysm. Pt c/o Left arm pain from fall   BP 128/76 HR 70 100%  RA 97.5 CBG 118

## 2020-08-27 ENCOUNTER — Observation Stay (HOSPITAL_COMMUNITY): Payer: Medicaid Other

## 2020-08-27 DIAGNOSIS — R739 Hyperglycemia, unspecified: Secondary | ICD-10-CM | POA: Diagnosis not present

## 2020-08-27 DIAGNOSIS — W108XXA Fall (on) (from) other stairs and steps, initial encounter: Secondary | ICD-10-CM | POA: Diagnosis not present

## 2020-08-27 DIAGNOSIS — S065X9A Traumatic subdural hemorrhage with loss of consciousness of unspecified duration, initial encounter: Secondary | ICD-10-CM | POA: Diagnosis not present

## 2020-08-27 DIAGNOSIS — Z982 Presence of cerebrospinal fluid drainage device: Secondary | ICD-10-CM | POA: Diagnosis not present

## 2020-08-27 LAB — CBC
HCT: 36.9 % (ref 36.0–46.0)
Hemoglobin: 11.7 g/dL — ABNORMAL LOW (ref 12.0–15.0)
MCH: 25.5 pg — ABNORMAL LOW (ref 26.0–34.0)
MCHC: 31.7 g/dL (ref 30.0–36.0)
MCV: 80.4 fL (ref 80.0–100.0)
Platelets: 151 10*3/uL (ref 150–400)
RBC: 4.59 MIL/uL (ref 3.87–5.11)
RDW: 13.8 % (ref 11.5–15.5)
WBC: 6.1 10*3/uL (ref 4.0–10.5)
nRBC: 0 % (ref 0.0–0.2)

## 2020-08-27 LAB — GLUCOSE, CAPILLARY: Glucose-Capillary: 124 mg/dL — ABNORMAL HIGH (ref 70–99)

## 2020-08-27 IMAGING — CT CT HEAD W/O CM
3 of 4 series · 15 of 47 positions shown, 18 images · non-contrast
Comparison: [DATE]

CLINICAL DATA: Subdural hematoma

EXAM:
CT HEAD WITHOUT CONTRAST
TECHNIQUE: Contiguous axial images were obtained from the base of the skull
through the vertex without intravenous contrast.

[Series 4: head 2.0 h70h · axial · 0.42mm/px · z∈[+1257,+1387]mm · 9 of 83 slices shown, 12 images]
[im 9/83  brain]
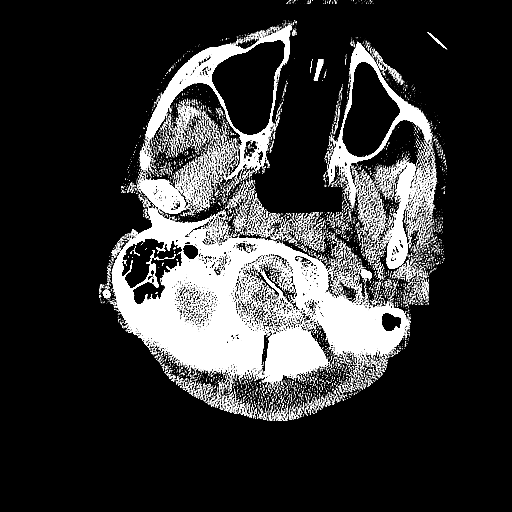
[im 9/83  bone]
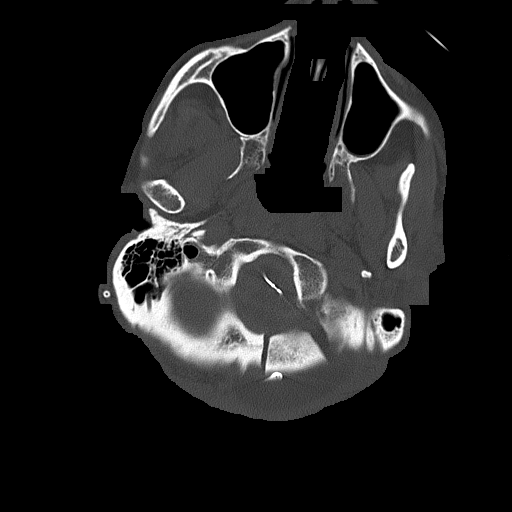
[im 17/83  brain]
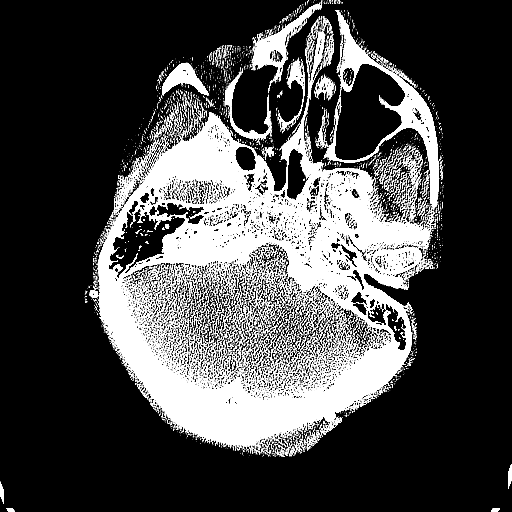
[im 25/83  brain]
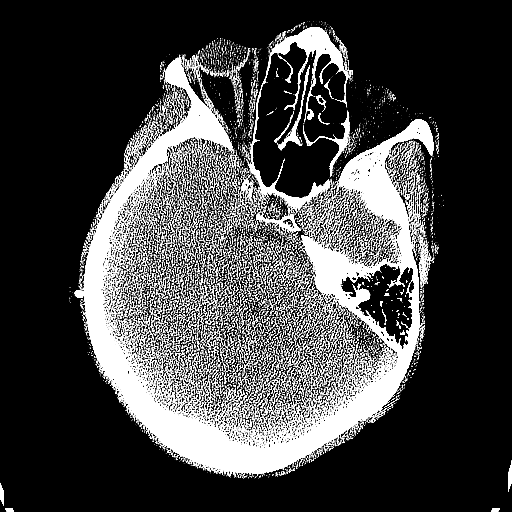
[im 33/83  brain]
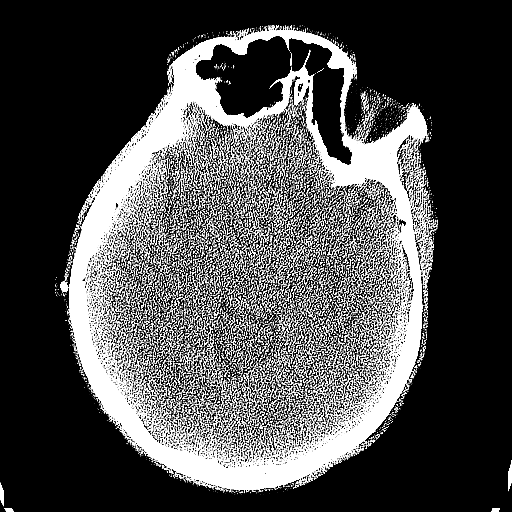
[im 42/83  brain]
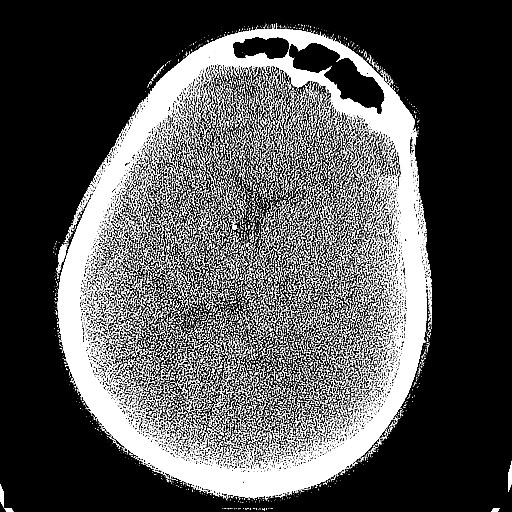
[im 42/83  bone]
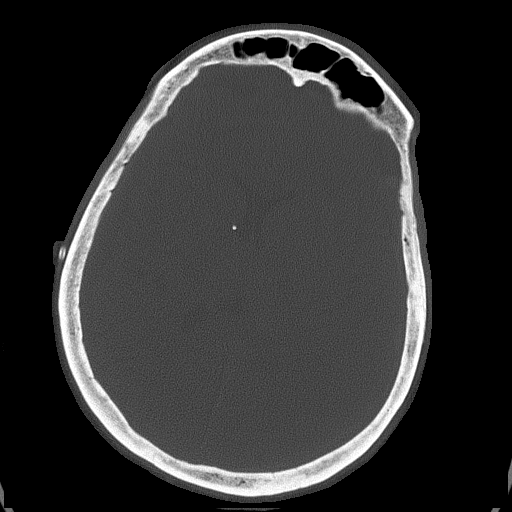
[im 50/83  brain]
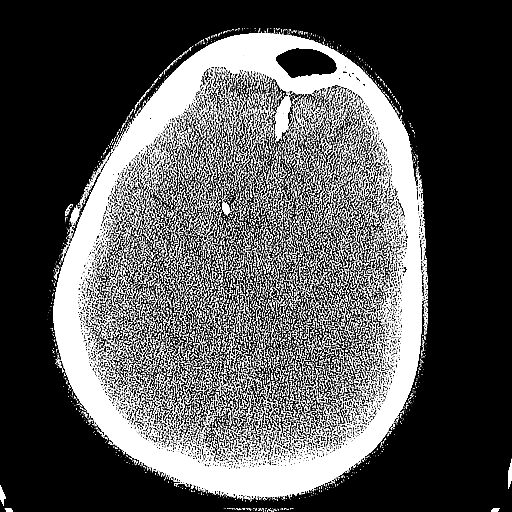
[im 58/83  brain]
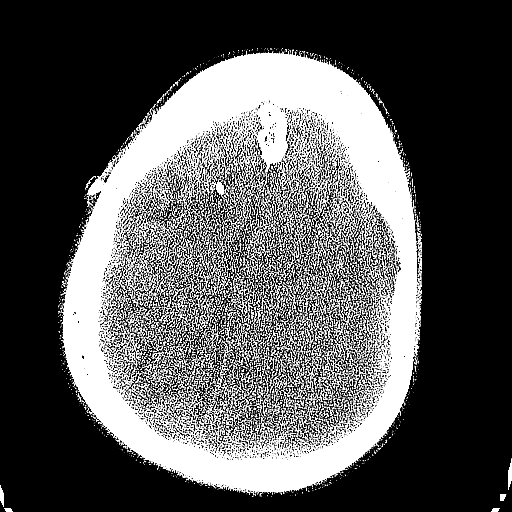
[im 66/83  brain]
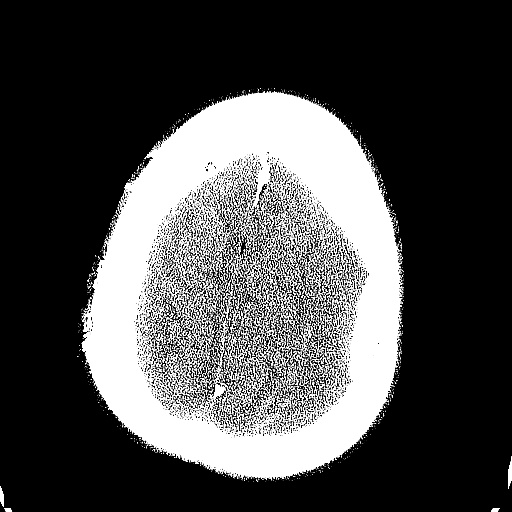
[im 74/83  brain]
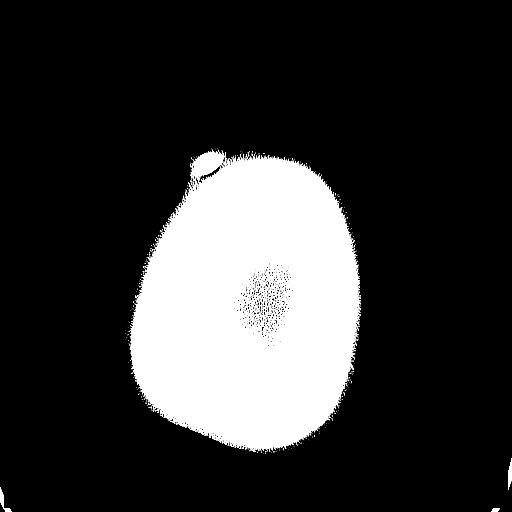
[im 74/83  bone]
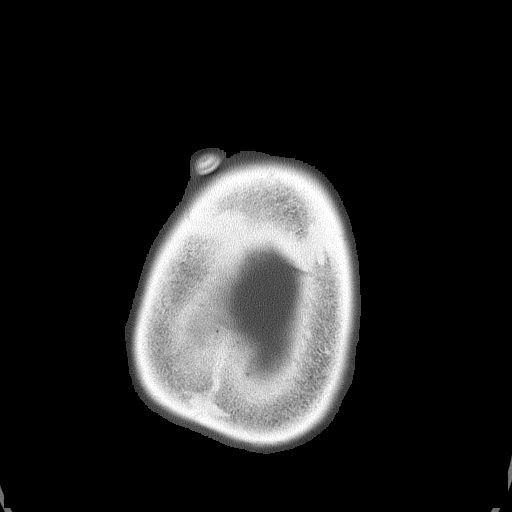

[Series 5: head 3.0 mpr cor · coronal · 0.32mm/px · 3 of 70 slices shown]
[im 24/70  brain]
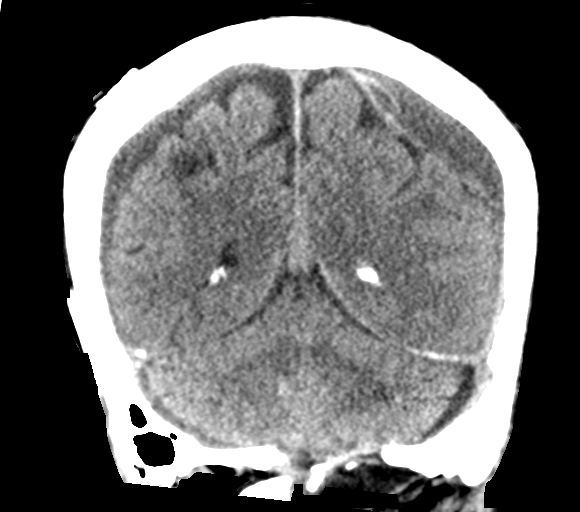
[im 31/70  brain]
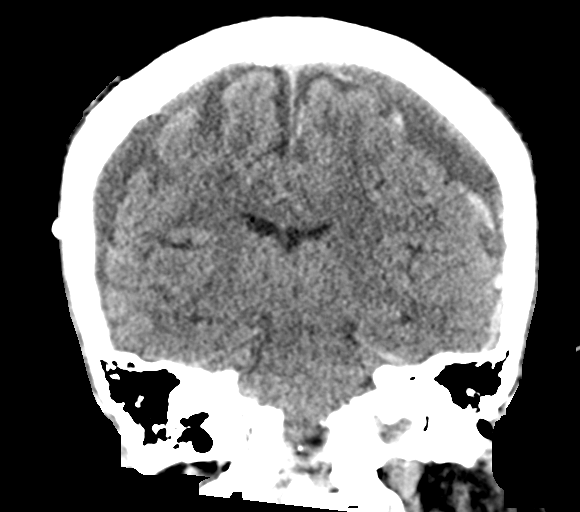
[im 39/70  brain]
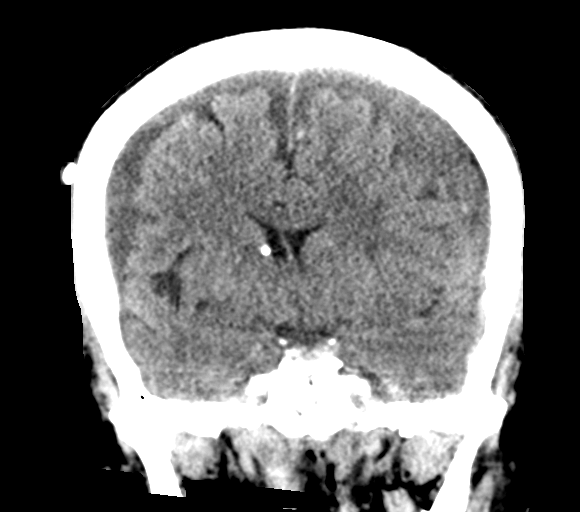

[Series 6: head 3.0 mpr sag · sagittal · 0.32mm/px · 3 of 59 slices shown]
[im 22/59  brain]
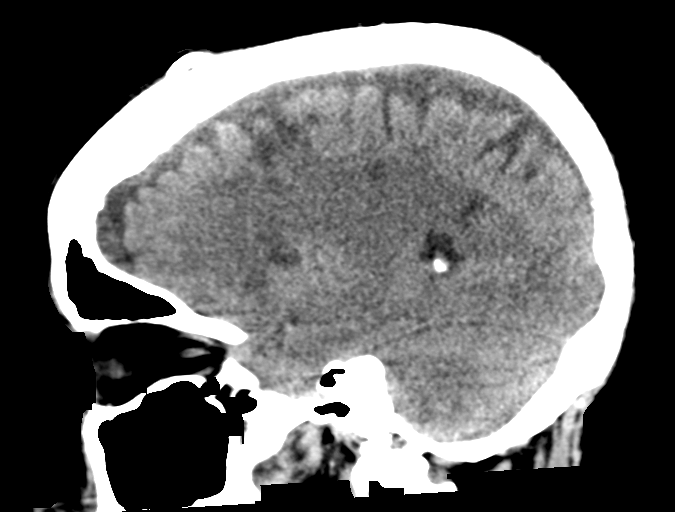
[im 30/59  brain]
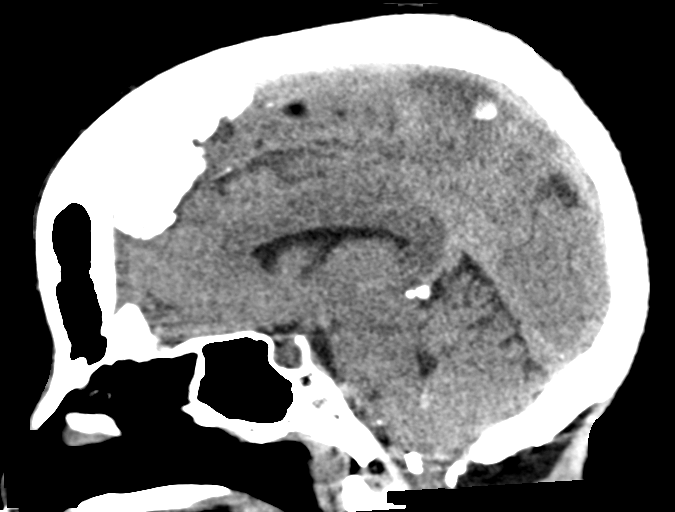
[im 37/59  brain]
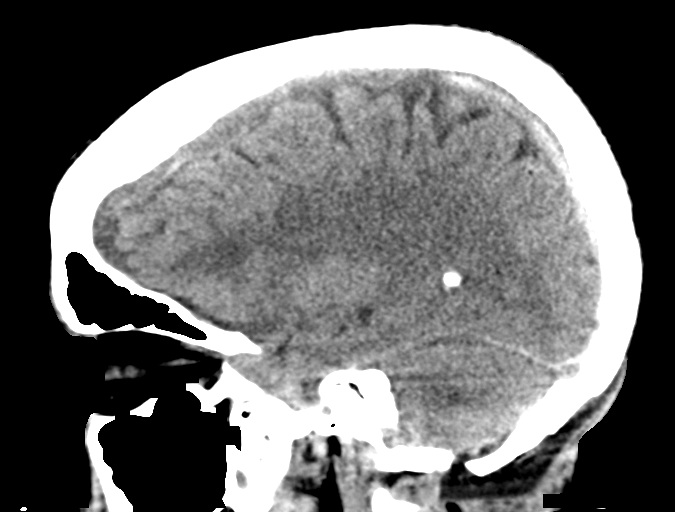

[15 of 47 positions shown; findings below may reference images not displayed]

FINDINGS: Brain: Bilateral convexity subdural hematomas, measuring 10 mm on
the right and 14 mm on the left, unchanged. There is a right frontal
approach shunt catheter. Mass effect on the ventricles is slightly
decreased. Rightward midline shift measures 4 mm.

Vascular: Negative

Skull: Left suboccipital craniotomy.

Sinuses/Orbits: Negative

Other: None
IMPRESSION: 1. Unchanged size of bilateral convexity subdural hematomas.
2. Slightly decreased mass effect on the ventricles with 4 mm
rightward midline shift.

## 2020-08-27 MED ORDER — CHLORHEXIDINE GLUCONATE 0.12 % MT SOLN
15.0000 mL | Freq: Two times a day (BID) | OROMUCOSAL | Status: DC
  Administered 2020-08-27 – 2020-10-07 (×80): 15 mL via OROMUCOSAL
  Filled 2020-08-27 (×78): qty 15

## 2020-08-27 MED ORDER — GLYCOPYRROLATE 1 MG PO TABS
2.0000 mg | ORAL_TABLET | Freq: Two times a day (BID) | ORAL | Status: DC
  Administered 2020-08-27 – 2020-09-02 (×14): 2 mg
  Filled 2020-08-27 (×15): qty 2

## 2020-08-27 MED ORDER — OSMOLITE 1.2 CAL PO LIQD: 1000.0000 mL | ORAL | Status: DC

## 2020-08-27 MED ORDER — ORAL CARE MOUTH RINSE
15.0000 mL | Freq: Two times a day (BID) | OROMUCOSAL | Status: DC
  Administered 2020-08-27 – 2020-10-06 (×73): 15 mL via OROMUCOSAL

## 2020-08-27 MED ORDER — LORAZEPAM 2 MG/ML IJ SOLN
1.0000 mg | Freq: Once | INTRAMUSCULAR | Status: AC | PRN
  Administered 2020-08-27: 1 mg via INTRAVENOUS
  Filled 2020-08-27: qty 1

## 2020-08-27 MED ORDER — CLONAZEPAM 0.5 MG PO TABS
0.5000 mg | ORAL_TABLET | Freq: Every evening | ORAL | Status: DC | PRN
  Administered 2020-08-28: 0.5 mg
  Filled 2020-08-27: qty 1

## 2020-08-27 NOTE — Progress Notes (Signed)
FPTS Interim Progress Note  S:  Got page from nurse regarding patient attempting to remove IV's and get out of bed.  O: BP (!) 154/103 (BP Location: Left Arm)   Pulse 83   Temp 98.6 F (37 C) (Oral)   Resp 18   Wt 55.5 kg   SpO2 99%   BMI 19.75 kg/m    Physical Exam Constitutional:      General: She is not in acute distress. Neurological:     General: No focal deficit present.     Mental Status: She is alert. Mental status is at baseline.     GCS: GCS eye subscore is 4. GCS verbal subscore is 5. GCS motor subscore is 6.     Comments: Patient gives 1 word responses but responds appropriately  Psychiatric:        Speech: Speech normal.        Behavior: Behavior is uncooperative. Behavior is not agitated, aggressive or combative.     A/P:  Delirium Has been common for patient at night.  Got dose of Seroquel.  Physical and Neuro exam unchanged from admission.  Unable to have sitter currently given staffing shortage.  Concern for repeat fall. - Recommended mitts, ordering fall precautions and lap belt to prevent fall  Delora Fuel, MD 08/27/2020, 2:53 AM PGY-1, [facility] Medicine Service pager [phone]

## 2020-08-27 NOTE — Progress Notes (Signed)
Family Medicine Teaching Service Daily Progress Note Intern Pager: [phone]  Patient name: Stacy Moran Medical record number: [id number] Date of birth: Aug 03, 1955 Age: 65 y.o. Gender: female  Primary Care Provider: [name], MD Consultants: Neurology, Neurosurgery  Code Status: Full  Pt Overview and Major Events to Date:  5/31 - Admitted  Assessment and Plan: Stacy Moran is a 66 y.o. female presenting s/p fall admitted for increased weakness and pain since fall this morning. 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    Fall 2/2 Unsteady Gait  Increased Weakness All x-rays completed on admission were negative for fractures (chest, c-spine, pelvis, left femur/humerous/wrist/forearm).  Disposition possibly CIR vs SNF.  -Vitals per floor protocol  -Fall precautions -Seizure precautions  -PT/OT eval and treat  -1:1 tele monitoring given patient is high fall risk; if fails, 1:1 sitter once available -Low-threshold for EEG if patient has fluctuating mental status -SCD's for VTE ppx   Acute on Chronic Subdural Hematoma  Hx shunt Holding VTE prophylaxis and getting repeat head CT today. If stable findings on CT, will begin subcutaneous Heparin. -Neurosurgery consulted: will adjust shunt settings -Repeat Head CT today -SCD's for VTE ppx  -Will start subcutaneous heparin for VTE ppx if imaging stable   HTN: Chronic, stable BP range since admission 121-167/79-119 (most recently 167/106). S/p Clevidipine gtt in the ED on admission. Patient has not yet received amlodipine, and we will continue to monitor  -Vitals per protocol  -SBP goal <140 -Continue home amlodipine 10mg , hydralazine 50mg  q8h, labetalol 300mg  TID  Anemia: Chronic, stable Hgb on admission 11.4>11.9, CBC ordered but not drawn this morning. Baseline 11-12. No signs of bleeding on physical  exam.  -CBC daily  -Monitor change in mental status   Hx Seizures: stable   Related to prior aneurysm rupture. Evaluated by neurology on admission, unlikely to have been seizure and defer EEG unless change in mental status. -Seizure precautions  -Home Keppra increased to 1000 mg daily   Dysphagia, with PEG-tub  Increased Secretions -Continue scopolamine patch  -SLP consult  -NPO; all feeds and medications per tube  GERD: Chronic, stable -Holding home Metoclopramide   Hypothyroidism s/p total thyroidectomy Last TSH within normal limits on 06/05/20 at 4.39. -Continue home synthroid 161mcg daily.   Anxiety  Agitation Overnight patient was restless and attempted to get out of bed and remove IVs; mitts, lap belt, and fall precautions placed. Home medications: Klonopin 0.5 mg twice daily, Seroquel 50 mg twice daily.  Has required restraints in the past due to agitation at night. -Holding Klonopin -Continue Seroquel 50 mg twice daily; can increase if more agitated -1:1 telemetry sitter  Prediabetes Hgb A1c 5.8 on admission.   FEN/GI: NPO; per tube Prophylaxis: SCD's   The patient will require care spanning > 2 midnights and should be moved to inpatient because: Unsafe d/c plan  Dispo: The patient is from: Home              Anticipated d/c is to: Undecided              Patient currently is not medically stable to d/c.   Difficult to place patient No     Subjective:  Patient unable to effectively communicate verbally.  Patient stated she knew why she was here, but when asked what happened she became silent.  Objective: Temp:  [97.9 F (36.6 C)-98.6 F (37  C)] 97.9 F (36.6 C) (06/01 0446) Pulse Rate:  [79-101] 93 (06/01 0446) Resp:  [13-22] 17 (06/01 0446) BP: (121-162)/(79-119) 162/106 (06/01 0446) SpO2:  [98 %-100 %] 99 % (06/01 0446) Weight:  [55.5 kg] 55.5 kg (05/31 2314) Physical Exam: General: Confused, attempting to get out of the bed multiple times  during exam, very soft voice and unable to comprehend patient's words Cardiovascular: Difficult to auscultate as patient continued to attempt vocalization during examination Respiratory: Breathing comfortably on room air, no increased work of breathing, rhonchorous upper airway sounds Abdomen: Nontender, nondistended Extremities: Moving all extremities equally and appropriately  Laboratory: Recent Labs  Lab 08/26/20 1052 08/26/20 1115  WBC 5.7  --   HGB 11.4* 11.9*  HCT 36.9 35.0*  PLT 162  --    Recent Labs  Lab 08/26/20 1052 08/26/20 1115  NA 137 141  K 4.5 4.6  CL 101 102  CO2 29  --   BUN 16 22  CREATININE 0.72 0.50  CALCIUM 9.6  --   PROT 6.9  --   BILITOT 0.5  --   ALKPHOS 61  --   ALT 23  --   AST 31  --   GLUCOSE 201* 194*    Imaging/Diagnostic Tests: DG Shoulder Right  Result Date: 08/26/2020 CLINICAL DATA:  Right shoulder pain after fall today. EXAM: RIGHT SHOULDER - 2+ VIEW COMPARISON:  None. FINDINGS: There is no evidence of fracture or dislocation. There is no evidence of arthropathy or other focal bone abnormality. Soft tissues are unremarkable. IMPRESSION: Negative. Electronically Signed   By: Marijo Conception M.D.   On: 08/26/2020 15:56   DG Forearm Left  Result Date: 08/26/2020 CLINICAL DATA:  Fall, left wrist and forearm pain EXAM: LEFT FOREARM - 2 VIEW; LEFT WRIST - COMPLETE 3+ VIEW COMPARISON:  None. FINDINGS: No evidence of acute fracture or dislocation involving the left forearm or left wrist. 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 IV tubing is present at the ulnar aspect of the wrist. No focal soft tissue swelling. IMPRESSION: 1. No evidence of acute fracture or dislocation involving the left forearm or left wrist. 2. Lunotriquetral coalition. Electronically Signed   By: Davina Poke D.O.   On: 08/26/2020 14:20   DG Wrist 2 Views Right  Result Date: 08/26/2020 CLINICAL DATA:  Right wrist pain  after fall. EXAM: RIGHT WRIST - 2 VIEW COMPARISON:  None. FINDINGS: There is no evidence of fracture or dislocation. There is no evidence of arthropathy or other focal bone abnormality. Soft tissues are unremarkable. IMPRESSION: Negative. Electronically Signed   By: Marijo Conception M.D.   On: 08/26/2020 15:56   DG Wrist Complete Left  Result Date: 08/26/2020 CLINICAL DATA:  Fall, left wrist and forearm pain EXAM: LEFT FOREARM - 2 VIEW; LEFT WRIST - COMPLETE 3+ VIEW COMPARISON:  None. FINDINGS: No evidence of acute fracture or dislocation involving the left forearm or left wrist. 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 IV tubing is present at the ulnar aspect of the wrist. No focal soft tissue swelling. IMPRESSION: 1. No evidence of acute fracture or dislocation involving the left forearm or left wrist. 2. Lunotriquetral coalition. Electronically Signed   By: Davina Poke D.O.   On: 08/26/2020 14:20   CT Cervical Spine Wo Contrast  Result Date: 08/26/2020 CLINICAL DATA:  Fall down stairs EXAM: CT CERVICAL SPINE WITHOUT CONTRAST TECHNIQUE: Multidetector CT imaging of the cervical  spine was performed without intravenous contrast. Multiplanar CT image reconstructions were also generated. COMPARISON:  None. FINDINGS: Alignment: Mild retrolisthesis at C4-C5 and C5-C6. Skull base and vertebrae: Degenerative plate irregularity at C4-C5 and C5-C6. Vertebral body heights are otherwise maintained. No acute cervical spine fracture. Probable prior partial resection of the posterior arch of C1. Soft tissues and spinal canal: No prevertebral fluid or swelling. No visible canal hematoma. Disc levels: Multilevel degenerative changes are present including disc space narrowing, endplate osteophytes, and facet and uncovertebral hypertrophy. These changes are greatest at C4-C5 and C5-C6. Upper chest: No apical lung mass.  Tracheostomy device is present. Other: Unremarkable.  IMPRESSION: No acute cervical spine fracture. Electronically Signed   By: Macy Mis M.D.   On: 08/26/2020 11:31   MR BRAIN WO CONTRAST  Result Date: 08/26/2020 CLINICAL DATA:  Intracranial hemorrhage. History of ruptured aneurysm. Fall. EXAM: MRI HEAD WITHOUT CONTRAST TECHNIQUE: Multiplanar, multiecho pulse sequences of the brain and surrounding structures were obtained without intravenous contrast. COMPARISON:  None. FINDINGS: Examination degraded by motion and susceptibility effects related to shunt device. Brain: Bilateral subdural hematomas are unchanged, measuring approximately 9 mm on the right and 16 mm on the left. Trace rightward midline shift is unchanged. No hydrocephalus. Right frontal approach shunt catheter tip is at the right foramen of Monro. Multiple old small vessel infarcts. There is multifocal periventricular white matter hyperintensity, most often a result of chronic microvascular ischemia. Vascular: Normal flow voids. Skull and upper cervical spine: Normal marrow signal. Sinuses/Orbits: Paranasal sinuses and mastoids are free of fluid. Absent left lobe. Other: None IMPRESSION: 1. Motion degraded examination. 2. Unchanged bilateral subdural hematomas and trace rightward midline shift. 3. Right frontal approach shunt catheter tip at the right foramen of Monro. No hydrocephalus. 4. Multiple old small vessel infarcts and findings of chronic microvascular ischemia. Electronically Signed   By: Ulyses Jarred M.D.   On: 08/26/2020 23:34   DG Pelvis Portable  Result Date: 08/26/2020 CLINICAL DATA:  Fall. EXAM: PORTABLE PELVIS 1-2 VIEWS COMPARISON:  None. FINDINGS: There is no evidence of pelvic fracture or diastasis. No pelvic bone lesions are seen. Calcified uterine fibroid is noted. IMPRESSION: No acute abnormality seen. Electronically Signed   By: Marijo Conception M.D.   On: 08/26/2020 12:25   DG Hand 2 View Right  Result Date: 08/26/2020 CLINICAL DATA:  Right hand pain after fall.  EXAM: RIGHT HAND - 2 VIEW COMPARISON:  None. FINDINGS: There is no evidence of fracture or dislocation. There is no evidence of arthropathy or other focal bone abnormality. Soft tissues are unremarkable. IMPRESSION: Negative. Electronically Signed   By: Marijo Conception M.D.   On: 08/26/2020 15:54   DG Chest Portable 1 View  Result Date: 08/26/2020 CLINICAL DATA:  Fall.  Code stroke. EXAM: PORTABLE CHEST 1 VIEW COMPARISON:  07/30/2020 FINDINGS: Tracheostomy in place. 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 Heart size upper limits of normal. Mediastinal shadows otherwise unremarkable. The lungs are clear. No edema or effusions. IMPRESSION: No active disease. Electronically Signed   By: Nelson Chimes M.D.   On: 08/26/2020 11:43   DG Humerus Left  Result Date: 08/26/2020 CLINICAL DATA:  Fall. EXAM: LEFT HUMERUS - 2+ VIEW COMPARISON:  None. FINDINGS: There is no evidence of fracture or other focal bone lesions. Soft tissues are unremarkable. IMPRESSION: Negative. Electronically Signed   By: Marijo Conception M.D.   On: 08/26/2020 11:45   DG Humerus Right  Result Date: 08/26/2020 CLINICAL DATA:  Right arm pain after fall. EXAM: RIGHT HUMERUS - 2+ VIEW COMPARISON:  None. FINDINGS: There is no evidence of fracture or other focal bone lesions. Soft tissues are unremarkable. IMPRESSION: Negative. Electronically Signed   By: Marijo Conception M.D.   On: 08/26/2020 15:55   DG Femur Portable Min 2 Views Left  Result Date: 08/26/2020 CLINICAL DATA:  Fall. EXAM: LEFT FEMUR PORTABLE 2 VIEWS COMPARISON:  None. FINDINGS: There is no evidence of fracture or other focal bone lesions. Soft tissues are unremarkable. IMPRESSION: Negative. Electronically Signed   By: Marijo Conception M.D.   On: 08/26/2020 11:44   CT HEAD CODE STROKE WO CONTRAST  Result Date: 08/26/2020 CLINICAL DATA:  Code stroke. 65 year old female with history of left PICA aneurysm clipping, subarachnoid and subdural hemorrhage. EXAM: CT HEAD WITHOUT CONTRAST  TECHNIQUE: Contiguous axial images were obtained from the base of the skull through the vertex without intravenous contrast. COMPARISON:  Head CT 07/30/2020 and earlier. FINDINGS: Brain: Persistent bilateral subdural hematomas, mixed density on the left with mildly increased hyperdense blood products on that side since 07/30/2020 (series 2, image 16). Trace para falcine hyperdense blood has also increased on image 21. The left-side subdural is mildly lobulated and measures up to 12 mm thickness, with volume of blood increased along the anterior left frontal convexity. Stable mostly low-density right subdural volume, with hematoma on that side measuring 9-10 mm in thickness. Rightward midline shift has increased from 4 mm to 8 mm. Mass effect on the ventricles has increased. A right superior frontal approach ventriculostomy catheter appears stable and in communication with the right lateral ventricle. No ventriculomegaly. Basilar cisterns are stable. No superimposed No cortically based acute infarct identified. Stable gray-white matter differentiation throughout the brain. There is some chronic right frontal lobe encephalomalacia. Vascular: Mild Calcified atherosclerosis at the skull base. Aneurysm clip at the left pre medullary cistern is stable. Skull: Stable left suboccipital, foramen magnum craniotomy and partial C1 ring resection. No acute osseous abnormality identified. Sinuses/Orbits: Visualized paranasal sinuses and mastoids are stable and well aerated. Other: Retained secretions in the nasopharynx similar to the earlier this month. Prior resection of the left globe. Negative right orbit. 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 Stable right superior frontal vertex shunt reservoir with tubing tracking to the posterior right neck. ASPECTS [facility] Stroke Program Early CT Score) Total score (0-10 with 10 being normal): 10 IMPRESSION: 1. Acute on  chronic left side subdural hematoma, increased since 07/30/2020 with new hyperdense blood products, and increased intracranial mass effect with rightward midline shift of the anterior septum pellucidum now 8 mm (previously 4 mm). Stable smaller right side subdural hematoma. 2. No acute cortically based infarct identified. 3. Stable right superior frontal approach ventriculostomy catheter with no ventriculomegaly. Stable postoperative changes including small left suboccipital pseudomeningocele. 4. These results were communicated to Dr. Curly Shores at 11:23 am on 08/26/2020 by text page via the [facility] messaging system. Electronically Signed   By: Genevie Ann M.D.   On: 08/26/2020 11:25     Rise Patience, DO 08/27/2020, 6:51 AM PGY-1, [place] Intern pager: [phone], text pages welcome

## 2020-08-27 NOTE — Progress Notes (Signed)
Addemndum to progressive mobility assessment.  Patient is probably capable of stand with 1-2 assist, but will not follow commands thus unable to assess.

## 2020-08-27 NOTE — TOC CAGE-AID Note (Signed)
Transition of Care [facility]) - CAGE-AID Screening   Patient Details  Name: Stacy Moran MRN: [id number] Date of Birth: 08-31-1955  [facility]) CM/SW Contact:    Clovis Cao, RN Phone Number: [phone] 08/27/2020, 2:48 PM   Clinical Narrative: Pt here after falling.  Pt not FC at this time and is unable to participate in assessment.   CAGE-AID Screening: Substance Abuse Screening unable to be completed due to: : Patient unable to participate

## 2020-08-27 NOTE — Progress Notes (Signed)
There is a comment in restraint order to assess and discontinue restraints in 4 hours, which would be about 0704 this AM.  I notified charge nurse, Philomena RN who said that all restraint orders are for 24 hours and that I should assess patient but only remove restraints if assessment warranted.  I passed this on to day shift nurse.  I paged day shift family medicine as follows: Hi Dr. Oleh Genin.  I was the night nurse for this patient so I will go home soon.  I contacted night shift residents about the patient.  They wanted sitter but there was apparently insufficient staffing.  They did tele sitter and lap belt restraints.  In the comment section of the restraints they said to reassess and remove restraints in four hours.  My assessment is that the patient pulls off her gown faster than I can put it on, kicks me in the head when I give meds through peg tube, and will fall within seconds of leaving the room if lap belt is removed.

## 2020-08-27 NOTE — Evaluation (Signed)
Clinical/Bedside Swallow Evaluation Patient Details  Name: Lezley Bedgood MRN: [id number] Date of Birth: 1956-02-18  Today's Date: 08/27/2020 Time: SLP Start Time (ACUTE ONLY): 0846 SLP Stop Time (ACUTE ONLY): 0902 SLP Time Calculation (min) (ACUTE ONLY): 16.27 min  Past Medical History:  Past Medical History:  Diagnosis Date  . Prosthetic eye globe   . 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 Status post insertion of percutaneous endoscopic gastrostomy (PEG) tube ([place])   . Subdural hematoma ([place])   . Tracheostomy dependent [facility])    Past Surgical History:  Past Surgical History:  Procedure Laterality Date  . BURR HOLE N/A 03/18/2020   Procedure: Haskell Flirt;  Surgeon: [name], MD;  Location: [place];  Service: Neurosurgery;  Laterality: N/A;  . CRANIOTOMY Left 01/30/2020   Procedure: LEFT FAR LATERAL CRANIOTOMY FOR ANEURYSM CLIPPING;  Surgeon: [name], MD;  Location: [place];  Service: Neurosurgery;  Laterality: Left;  . DIRECT LARYNGOSCOPY N/A 02/29/2020   Procedure: DIRECT LARYNGOSCOPY;  Surgeon: [name], MD;  Location: [place];  Service: ENT;  Laterality: N/A;  . IR ANGIO INTRA EXTRACRAN SEL INTERNAL CAROTID BILAT MOD SED  01/30/2020  . IR ANGIO VERTEBRAL SEL VERTEBRAL UNI L MOD SED  01/30/2020  . IR CM INJ ANY COLONIC TUBE W/FLUORO  07/03/2020  . IR GASTROSTOMY TUBE MOD SED  02/22/2020  . IR [place] TUBE PERCUT W/FLUORO  07/03/2020  . LAPAROSCOPIC REVISION VENTRICULAR-PERITONEAL (V-P) SHUNT N/A 03/18/2020   Procedure: LAPAROSCOPIC REVISION VENTRICULAR-PERITONEAL (V-P) SHUNT;  Surgeon: [name], MD;  Location: [place];  Service: General;  Laterality: N/A;  . RADIOLOGY WITH ANESTHESIA N/A 01/30/2020   Procedure: IR WITH ANESTHESIA;  Surgeon: [name], MD;  Location: [place];  Service: Radiology;  Laterality: N/A;  . THYROIDECTOMY N/A 02/08/2020   Procedure: THYROIDECTOMY;  Surgeon: [name], MD;  Location: [place];  Service: ENT;  Laterality:  N/A;  . TRACHEOSTOMY TUBE PLACEMENT N/A 02/08/2020   Procedure: TRACHEOSTOMY;  Surgeon: [name], MD;  Location: [place];  Service: ENT;  Laterality: N/A;  . TRACHEOSTOMY TUBE PLACEMENT N/A 02/29/2020   Procedure: TRACHEOSTOMY EXCHANGE;  Surgeon: [name], MD;  Location: [place];  Service: ENT;  Laterality: N/A;  . VENTRICULOPERITONEAL SHUNT N/A 03/18/2020   Procedure: RIGHT SHUNT INSERTION VENTRICULAR-PERITONEAL/ BURR HOLE Evacuation of Subdural Hematoma;  Surgeon: [name], MD;  Location: [place];  Service: Neurosurgery;  Laterality: N/A;   HPI:  Pt is a 65 year old woman with who presented with acute worsening of subdural hematomas after fall. MRI brain 5/31: Unchanged bilateral subdural hematomas and trace rightward midline shift. Right frontal approach shunt catheter tip at the right foramen of Monro. No hydrocephalus. Multiple old small vessel infarcts and findings of chronic  microvascular ischemia. Neurosurgery considering increasing the setting on her shunt as of 5/31. 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  MBS 02/19/20 profound dysphagia with abnormal anatomy. Repeat MBS 02/26/21 minimal improvement- decr edema however significant subglottic edema/stenosis narrowing subglottic space. Bolus stops at UES with minimal opening with aspiration during/post swalow- continue NPO. MBS 04/09/20: severe pharyngoesophageal dysphagia with minimal improvements. subglottic edema was decreased but UES function continued to be significantly impaired in addition to tongue base retraction and pharyngeal contraction all contributing to pharyngeal stasis.   Assessment / Plan / Recommendation Clinical Impression  Pt was seen for bedside swallow  evaluation. Pt's daughter was contacted via phone and she indicated that the pt has not been consuming anything  p.o. since her discharge from [place] in February and that the pt has not received any further dysphagia treatment. Pt was confused during the evaluation and frequently attempting to get out of bed. Vocal quality was wet at baseline and intensity reduced. PMSV was in place throughout the evaluation. Trials were limited due to pt's performance and history. Pt demonstrated symptoms of pharyngeal dysphagia characterized by multiple swallows per bolus, worsening wet vocal quality, and coughing, suggesting pharyngeal residue and aspiration. Pt was scheduled to have an outpatient modified barium swallow study on 6/2. Considering the severity of pt's deficits during the last MBS, the absence of treatment since discharge, and her presentation today, SLP questions whether there has been significant improvement in the pt's swallow function. SLP can complete the study while pt is admitted to assess current function, but will defer it until pt's mentation improves some to rule out this being a contributing factor to impairments. Pt's daughter was educated regarding the pt's performance and recommendations; understanding and agreement were verbalized.  SLP Visit Diagnosis: Dysphagia, unspecified (R13.10)    Aspiration Risk  Moderate aspiration risk;Severe aspiration risk    Diet Recommendation NPO;Alternative means - long-term   Medication Administration: Via alternative means    Other  Recommendations Oral Care Recommendations: Oral care QID;Staff/trained caregiver to provide oral care   Follow up Recommendations Home health SLP      Frequency and Duration min 2x/week  2 weeks       Prognosis Prognosis for Safe Diet Advancement: Guarded Barriers to Reach Goals: Time post onset;Severity of deficits;Cognitive deficits;Behavior      Swallow Study   General Date of Onset: 02/19/20 HPI: Pt is a 65 year old woman with who presented with acute worsening of subdural hematomas after fall. MRI brain 5/31: Unchanged  bilateral subdural hematomas and trace rightward midline shift. Right frontal approach shunt catheter tip at the right foramen of Monro. No hydrocephalus. Multiple old small vessel infarcts and findings of chronic  microvascular ischemia. Neurosurgery considering increasing the setting on her shunt as of 5/31. 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  MBS 02/19/20 profound dysphagia with abnormal anatomy. Repeat MBS 02/26/21 minimal improvement- decr edema however significant subglottic edema/stenosis narrowing subglottic space. Bolus stops at UES with minimal opening with aspiration during/post swalow- continue NPO. MBS 04/09/20: severe pharyngoesophageal dysphagia with minimal improvements. subglottic edema was decreased but UES function continued to be significantly impaired in addition to tongue base retraction and pharyngeal contraction all contributing to pharyngeal stasis. Type of Study: Bedside Swallow Evaluation Previous Swallow Assessment: See HPI Diet Prior to this Study: NPO;PEG tube Temperature Spikes Noted: No Respiratory Status: Trach Collar;Trach Trach Size and Type: #6;Uncuffed History of Recent Intubation: No Behavior/Cognition: Alert;Confused;Requires cueing;Distractible;Impulsive;Agitated Oral Cavity Assessment: Excessive secretions Oral Care Completed by SLP: Recent completion by staff Oral Cavity - Dentition: Adequate natural dentition Vision: Functional for self-feeding Self-Feeding Abilities: Needs assist Patient Positioning: Upright in bed;Postural control adequate for testing Baseline Vocal Quality: Wet;Low vocal intensity Volitional Cough: Cognitively unable to elicit Volitional Swallow: Unable to elicit    Oral/Motor/Sensory Function Overall Oral Motor/Sensory Function:  (Difficult to assess)   Ice Chips Ice chips:  Impaired Presentation: Spoon Pharyngeal Phase Impairments: Wet Vocal Quality;Cough - Immediate   Thin Liquid Thin Liquid: Not  tested    Nectar Thick Nectar Thick Liquid: Not tested   Honey Thick Honey Thick Liquid: Not tested   Puree Puree: Impaired Presentation: Spoon Pharyngeal Phase Impairments: Multiple swallows;Cough - Immediate;Wet Vocal Quality   Solid     Solid: Not tested     Shalisha Clausing I. Hardin Negus, [place], [place] Office number [phone] Pager [phone]  HIAWATHA DRESSEL 08/27/2020,9:19 AM

## 2020-08-27 NOTE — Progress Notes (Signed)
Initial Nutrition Assessment  DOCUMENTATION CODES:   Not applicable  INTERVENTION:   -Continue 355 ml Osmolite 1.2 QID via PEG -Continue 45 ml Prosource TF BID via PEG -Continue 200 ml free water every 4 hours via PEG  Complete regimen provides 1784 kcals, 100 grams protein, and 1964 ml free water daily, meeting 100% of estimated kcal and protein needs  NUTRITION DIAGNOSIS:   Inadequate oral intake related to inability to eat as evidenced by NPO status.  GOAL:   Patient will meet greater than or equal to 90% of their needs  MONITOR:   Labs,Weight trends,TF tolerance,Skin,I & O's  REASON FOR ASSESSMENT:   Other (Comment)    ASSESSMENT:   65 year old woman with recent (winter 2021) ruptured PICA anuerysm resulting in [facility] with prolonged hospital stay requiring tracheostomy placement, thyroidectomy (for tracheostomy), and PEG tube placement presenting with acute worsening of her subdural hematomas after a fall today. She complains of residual reduction in bilateral grip strength. Daughter [name], works at [employer]) provides additional history. The patient has poor sleep and becomes agitated and confused at night at times. This morning, despite multiple home measures include alarms and lights, the patient fell down the stairs. Denies headaches, chest pain, abdomina pain, endorses only hand pain.  Pt admitted with acute on chronic SDH.  Reviewed I/O's: +405 ml x 24 hours  Pt on trach collar. She was able to communicate via head nods. She denies any nausea, vomiting, or abdominal pain.   Per SLP notes, recommend continued NPO status. Pt receiving nutrition via PEG: 355 ml Osmolite 1.2 QID, 45 ml Prosource Plus BID, and 200 ml free water flush 4 times daily. Complete regimen provides 1784 kcals, 100 grams protein, and 1964 ml free water daily, meeting 100% of estimated kcal and protein needs.   Reviewed wt hx; pt has experienced a 6.7% wt loss over the past 3 months, which is  not significant for time frame.   Medications reviewed and include keppra.   Labs reviewed.   NUTRITION - FOCUSED PHYSICAL EXAM:  Flowsheet Row Most Recent Value  Orbital Region No depletion  Upper Arm Region Mild depletion  Thoracic and Lumbar Region No depletion  Buccal Region No depletion  Temple Region Mild depletion  Clavicle Bone Region Mild depletion  Clavicle and Acromion Bone Region Mild depletion  Dorsal Hand No depletion  Patellar Region Mild depletion  Anterior Thigh Region Mild depletion  Posterior Calf Region Mild depletion  Edema (RD Assessment) None  Hair Reviewed  Eyes Reviewed  Mouth Reviewed  Skin Reviewed       Diet Order:   Diet Order            Diet NPO time specified  Diet effective now                 EDUCATION NEEDS:   Not appropriate for education at this time  Skin:  Skin Assessment: Reviewed RN Assessment  Last BM:  Unknown  Height:   Ht Readings from Last 1 Encounters:  07/29/20 5\' 6"  (1.676 m)    Weight:   Wt Readings from Last 1 Encounters:  08/26/20 55.5 kg    Ideal Body Weight:  59.1 kg  BMI:  Body mass index is 19.75 kg/m.  Estimated Nutritional Needs:   Kcal:  6734-1937  Protein:  85-100 grams  Fluid:  > 1.7 L    [name], RD, LDN, [place] Registered Dietitian II Certified Diabetes Care and Education Specialist Please refer  to [facility] for RD and/or RD on-call/weekend/after hours pager

## 2020-08-27 NOTE — Evaluation (Signed)
Physical Therapy Evaluation Patient Details Name: Stacy Moran MRN: [id number] DOB: 06/20/1955 Today's Date: 08/27/2020   History of Present Illness  65 y/o female presented to ED on 5/31 with generalized waekness and L arm pain/weakness s/p fall down the stairs. 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 Radiographs negative for acute fxs. PMH: SAH s/p PICA aneurysm clipping, bilateral SDH, seizures, chronic tracheostomy, PEG, seizures, HTN, GERD, anxiety  Clinical Impression  Patient information difficult to obtain due to impaired communication and cognition. Per chart review, patient lives with daughter and requires assistance for mobility due to unsteadiness with Froylan Hobby. Daughter provides 24/7 care. Patient currently functioning at [facility] +2 (safety) for bed mobility and transfers. Patient on 5 L with 28% FiO2 and able to maintain >98% throughout. Patient presents with generalized weakness, impaired balance, impaired cognition, impaired communication, impaired coordination, decreased activity tolerance, and impaired functional mobility. Patient will benefit from skilled PT services during acute stay to address listed deficits. Recommend SNF following discharge for potential to optimize functional mobility prior to returning home.     Follow Up Recommendations SNF;Supervision/Assistance - 24 hour    Equipment Recommendations  None recommended by PT    Recommendations for Other Services       Precautions / Restrictions Precautions Precautions: Fall Precaution Comments: trach, PEG Restrictions Weight Bearing Restrictions: No      Mobility  Bed Mobility Overal bed mobility: Needs Assistance Bed Mobility: Supine to Sit;Sit to Supine     Supine to sit: Min assist;+2 for safety/equipment Sit to supine: Min assist;+2 for safety/equipment   General bed mobility comments: minA for trunk elevation and repositioning hips  towards EOB    Transfers Overall transfer level: Needs assistance Equipment used: 1 person hand held assist Transfers: Sit to/from Stand Sit to Stand: Min assist;+2 physical assistance;+2 safety/equipment Stand pivot transfers: Min assist;+2 safety/equipment       General transfer comment: minA+2 to rise into standing with HHA  Ambulation/Gait Ambulation/Gait assistance: Min assist;+2 safety/equipment Gait Distance (Feet): 2 Feet Assistive device: 2 person hand held assist Gait Pattern/deviations: Step-to pattern;Decreased stride length;Decreased weight shift to right;Decreased weight shift to left;Shuffle;Wide base of support Gait velocity: decreased   General Gait Details: side steps towards HOB. MinA for balance.  Stairs            Wheelchair Mobility    Modified Rankin (Stroke Patients Only)       Balance Overall balance assessment: Needs assistance Sitting-balance support: Feet supported Sitting balance-Leahy Scale: Fair   Postural control: Posterior lean;Left lateral lean Standing balance support: Bilateral upper extremity supported Standing balance-Leahy Scale: Poor Standing balance comment: reliant on UE support and external assist                             Pertinent Vitals/Pain Pain Assessment: Faces Faces Pain Scale: Hurts little more Pain Location: L hand and wrist Pain Descriptors / Indicators: Tender Pain Intervention(s): Monitored during session;Repositioned    Home Living Family/patient expects to be discharged to:: Private residence Living Arrangements: Children Available Help at Discharge: Family;Available 24 hours/day Type of Home: House       Home Layout: Laundry or work area in basement;Able to live on main level with bedroom/bathroom Home Equipment: Environmental consultant - 4 wheels;Wheelchair - manual Additional Comments: No family present to get info.  Pt unable. Per chart review, patient was staying with the daughter. Home set  up  obtained from past admission 01/2020    Prior Function Level of Independence: Needs assistance   Gait / Transfers Assistance Needed: walks with assistance but unsteady with Baine Decesare  ADL's / Homemaking Assistance Needed: patient gets nutrition from tube feeds, daughter assists with all ADLs        Hand Dominance   Dominant Hand: Right    Extremity/Trunk Assessment   Upper Extremity Assessment Upper Extremity Assessment: Defer to OT evaluation LUE Deficits / Details: painful L wirst LUE: Unable to fully assess due to pain LUE Coordination: decreased fine motor;decreased gross motor    Lower Extremity Assessment Lower Extremity Assessment: Generalized weakness;Difficult to assess due to impaired cognition    Cervical / Trunk Assessment Cervical / Trunk Assessment: Kyphotic  Communication   Communication: Expressive difficulties;Tracheostomy;Passy-Muir valve  Cognition Arousal/Alertness: Awake/alert Behavior During Therapy: Flat affect Overall Cognitive Status: History of cognitive impairments - at baseline                                 General Comments: Following commands but difficulty processing and sequencing. Needing verbal and tactile cueing.      General Comments General comments (skin integrity, edema, etc.): On 5 lpm with 28% FiO2, spO2 >98% throughout    Exercises     Assessment/Plan    PT Assessment Patient needs continued PT services  PT Problem List Decreased strength;Decreased activity tolerance;Decreased balance;Decreased mobility;Decreased coordination;Decreased cognition;Decreased safety awareness       PT Treatment Interventions DME instruction;Gait training;Stair training;Therapeutic activities;Functional mobility training;Therapeutic exercise;Balance training;Patient/family education;Wheelchair mobility training    PT Goals (Current goals can be found in the Care Plan section)  Acute Rehab PT Goals Patient Stated Goal: did not  state PT Goal Formulation: Patient unable to participate in goal setting Time For Goal Achievement: 09/10/20 Potential to Achieve Goals: Fair    Frequency Min 3X/week   Barriers to discharge        Co-evaluation PT/OT/SLP Co-Evaluation/Treatment: Yes Reason for Co-Treatment: Necessary to address cognition/behavior during functional activity;To address functional/ADL transfers;For patient/therapist safety PT goals addressed during session: Mobility/safety with mobility;Balance OT goals addressed during session: ADL's and self-care       AM-PAC PT "6 Clicks" Mobility  Outcome Measure Help needed turning from your back to your side while in a flat bed without using bedrails?: A Little Help needed moving from lying on your back to sitting on the side of a flat bed without using bedrails?: A Little Help needed moving to and from a bed to a chair (including a wheelchair)?: A Little Help needed standing up from a chair using your arms (e.g., wheelchair or bedside chair)?: A Little Help needed to walk in hospital room?: A Little Help needed climbing 3-5 steps with a railing? : A Lot 6 Click Score: 17    End of Session Equipment Utilized During Treatment: Gait belt Activity Tolerance: Patient tolerated treatment well Patient left: in bed;with call bell/phone within reach;with bed alarm set;with restraints reapplied Nurse Communication: Mobility status PT Visit Diagnosis: Unsteadiness on feet (R26.81);Muscle weakness (generalized) (M62.81);Other abnormalities of gait and mobility (R26.89);Other symptoms and signs involving the nervous system (R29.898);Ataxic gait (R26.0)    Time: 6812-7517 PT Time Calculation (min) (ACUTE ONLY): 20 min   Charges:   PT Evaluation $PT Eval Moderate Complexity: 1 Mod          Moni Rothrock A. Gilford Rile, PT, DPT Acute Rehabilitation Services Pager [phone] Office [phone]  Tabari Volkert A Naziya Hegwood 08/27/2020, 2:55 PM

## 2020-08-27 NOTE — Evaluation (Signed)
Occupational Therapy Evaluation Patient Details Name: Stacy Moran MRN: [id number] DOB: [birth year] Today's Date: 08/27/2020    History of Present Illness 65 y.o. female with history of SAH, subdural, tracheostomy.  Patient had a fall down the stairs.   Clinical Impression   Patient admitted for the diagnosis above.  She was admitted for a long hospital stay from 11/21-3/22, where she was also admitted to CIR.  The patient did transition home with the daughter and 24/7 care.  The patient receives her nutrition from tube feeds, and the daughter assists with all ADL. IADL, community mobility, hand held assist for in home mobility, meals and meds.  Barriers are listed below.  OT in the acute setting for ADL trial 1x/wk, with recommendation for SNF post acute.      Follow Up Recommendations  SNF    Equipment Recommendations  None recommended by OT    Recommendations for Other Services       Precautions / Restrictions Precautions Precautions: Fall Restrictions Weight Bearing Restrictions: No      Mobility Bed Mobility Overal bed mobility: Needs Assistance Bed Mobility: Supine to Sit;Sit to Supine     Supine to sit: Min assist;+2 for safety/equipment Sit to supine: Min assist;+2 for safety/equipment     Patient Response: Cooperative  Transfers Overall transfer level: Needs assistance Equipment used: 1 person hand held assist Transfers: Sit to/from [facility] Sit to Stand: Min assist;+2 safety/equipment Stand pivot transfers: Min assist;+2 safety/equipment            Balance Overall balance assessment: Needs assistance Sitting-balance support: Feet supported Sitting balance-Leahy Scale: Fair   Postural control: Posterior lean;Left lateral lean Standing balance support: Bilateral upper extremity supported Standing balance-Leahy Scale: Poor                             ADL either performed or assessed with clinical judgement   ADL                                          General ADL Comments: Patient appears close to baseline for ADL completion.  OT to follow for toilet transfers, UB ADL and grooming to ensure max functional potential.     Vision Patient Visual Report: No change from baseline Additional Comments: Prosthetic eye globe     Perception     Praxis      Pertinent Vitals/Pain Pain Assessment: Faces Faces Pain Scale: Hurts little more Pain Location: L hand and wrist Pain Descriptors / Indicators: Tender Pain Intervention(s): Monitored during session     Hand Dominance Right   Extremity/Trunk Assessment Upper Extremity Assessment Upper Extremity Assessment: Generalized weakness;LUE deficits/detail LUE Deficits / Details: painful L wirst LUE: Unable to fully assess due to pain LUE Coordination: decreased fine motor;decreased gross motor   Lower Extremity Assessment Lower Extremity Assessment: Defer to PT evaluation   Cervical / Trunk Assessment Cervical / Trunk Assessment: Kyphotic   Communication Communication Communication: Expressive difficulties;Tracheostomy;Passy-Muir valve   Cognition Arousal/Alertness: Awake/alert Behavior During Therapy: Flat affect Overall Cognitive Status: History of cognitive impairments - at baseline                                     General Comments       Exercises  Shoulder Instructions      Home Living Family/patient expects to be discharged to:: Private residence Living Arrangements: Children Available Help at Discharge: Family;Available 24 hours/day Type of Home: House       Home Layout: Laundry or work area in basement;Able to live on main level with bedroom/bathroom     Bathroom Shower/Tub: Teacher, early years/pre: Standard Bathroom Accessibility: Yes How Accessible: Accessible via walker Home Equipment: Environmental consultant - 4 wheels;Wheelchair - manual   Additional Comments: No family present to get  info.  Pt unable. Per chart, patient patient was staing with the daughter, unsure exact home layout.      Prior Functioning/Environment Level of Independence: Needs assistance  Gait / Transfers Assistance Needed: always walks with assist, sounds like hand held ADL's / Homemaking Assistance Needed: patient gets nutrition from tube feeds, daughter assists with all ADLs            OT Problem List: Impaired balance (sitting and/or standing);Decreased safety awareness;Decreased cognition;Impaired UE functional use;Pain      OT Treatment/Interventions: Self-care/ADL training;Therapeutic activities;Balance training    OT Goals(Current goals can be found in the care plan section) Acute Rehab OT Goals OT Goal Formulation: Patient unable to participate in goal setting Time For Goal Achievement: 09/10/20 Potential to Achieve Goals: Fair ADL Goals Pt Will Perform Grooming: with supervision;sitting Pt Will Perform Upper Body Bathing: with min assist;sitting Pt Will Perform Upper Body Dressing: with min assist;sitting Pt Will Transfer to Toilet: with min assist;ambulating;regular height toilet  OT Frequency: Min 1X/week   Barriers to D/C:    none noted       Co-evaluation PT/OT/SLP Co-Evaluation/Treatment: Yes Reason for Co-Treatment: Necessary to address cognition/behavior during functional activity;For patient/therapist safety   OT goals addressed during session: ADL's and self-care      AM-PAC OT "6 Clicks" Daily Activity     Outcome Measure Help from another person eating meals?: Total Help from another person taking care of personal grooming?: A Lot Help from another person toileting, which includes using toliet, bedpan, or urinal?: A Lot Help from another person bathing (including washing, rinsing, drying)?: A Lot Help from another person to put on and taking off regular upper body clothing?: A Lot Help from another person to put on and taking off regular lower body clothing?:  Total 6 Click Score: 10   End of Session Equipment Utilized During Treatment: Oxygen Nurse Communication: Mobility status  Activity Tolerance: Patient tolerated treatment well Patient left: in bed;with call bell/phone within reach;with chair alarm set;with restraints reapplied  OT Visit Diagnosis: Unsteadiness on feet (R26.81);History of falling (Z91.81);Other symptoms and signs involving cognitive function;Pain Pain - Right/Left: Left Pain - part of body: Hand                Time: 1022-1047 OT Time Calculation (min): 25 min Charges:  OT General Charges $OT Visit: 1 Visit OT Evaluation $OT Eval Moderate Complexity: 1 Mod  08/27/2020  Rich, OTR/L  Acute Rehabilitation Services  Office:  [place] 08/27/2020, 11:09 AM

## 2020-08-28 ENCOUNTER — Encounter: Payer: Self-pay | Admitting: Internal Medicine

## 2020-08-28 ENCOUNTER — Ambulatory Visit (HOSPITAL_COMMUNITY): Payer: Medicaid Other

## 2020-08-28 ENCOUNTER — Encounter (HOSPITAL_COMMUNITY): Payer: Self-pay

## 2020-08-28 ENCOUNTER — Observation Stay (HOSPITAL_COMMUNITY): Payer: Medicaid Other

## 2020-08-28 DIAGNOSIS — I69020 Aphasia following nontraumatic subarachnoid hemorrhage: Secondary | ICD-10-CM | POA: Diagnosis not present

## 2020-08-28 DIAGNOSIS — Z930 Tracheostomy status: Secondary | ICD-10-CM | POA: Diagnosis not present

## 2020-08-28 DIAGNOSIS — R739 Hyperglycemia, unspecified: Secondary | ICD-10-CM | POA: Diagnosis not present

## 2020-08-28 DIAGNOSIS — S065X9A Traumatic subdural hemorrhage with loss of consciousness of unspecified duration, initial encounter: Secondary | ICD-10-CM | POA: Diagnosis not present

## 2020-08-28 DIAGNOSIS — D649 Anemia, unspecified: Secondary | ICD-10-CM | POA: Diagnosis present

## 2020-08-28 DIAGNOSIS — S82891A Other fracture of right lower leg, initial encounter for closed fracture: Secondary | ICD-10-CM | POA: Diagnosis not present

## 2020-08-28 DIAGNOSIS — E890 Postprocedural hypothyroidism: Secondary | ICD-10-CM | POA: Diagnosis present

## 2020-08-28 DIAGNOSIS — R29810 Facial weakness: Secondary | ICD-10-CM | POA: Diagnosis present

## 2020-08-28 DIAGNOSIS — F419 Anxiety disorder, unspecified: Secondary | ICD-10-CM | POA: Diagnosis present

## 2020-08-28 DIAGNOSIS — S065X0A Traumatic subdural hemorrhage without loss of consciousness, initial encounter: Secondary | ICD-10-CM | POA: Diagnosis present

## 2020-08-28 DIAGNOSIS — K5900 Constipation, unspecified: Secondary | ICD-10-CM | POA: Diagnosis not present

## 2020-08-28 DIAGNOSIS — Z79890 Hormone replacement therapy (postmenopausal): Secondary | ICD-10-CM | POA: Diagnosis not present

## 2020-08-28 DIAGNOSIS — R451 Restlessness and agitation: Secondary | ICD-10-CM | POA: Diagnosis not present

## 2020-08-28 DIAGNOSIS — E1165 Type 2 diabetes mellitus with hyperglycemia: Secondary | ICD-10-CM | POA: Diagnosis present

## 2020-08-28 DIAGNOSIS — G8194 Hemiplegia, unspecified affecting left nondominant side: Secondary | ICD-10-CM | POA: Diagnosis present

## 2020-08-28 DIAGNOSIS — I959 Hypotension, unspecified: Secondary | ICD-10-CM | POA: Diagnosis not present

## 2020-08-28 DIAGNOSIS — Z87891 Personal history of nicotine dependence: Secondary | ICD-10-CM | POA: Diagnosis not present

## 2020-08-28 DIAGNOSIS — Z20822 Contact with and (suspected) exposure to covid-19: Secondary | ICD-10-CM | POA: Diagnosis present

## 2020-08-28 DIAGNOSIS — K219 Gastro-esophageal reflux disease without esophagitis: Secondary | ICD-10-CM | POA: Diagnosis present

## 2020-08-28 DIAGNOSIS — G4089 Other seizures: Secondary | ICD-10-CM | POA: Diagnosis present

## 2020-08-28 DIAGNOSIS — Z931 Gastrostomy status: Secondary | ICD-10-CM | POA: Diagnosis not present

## 2020-08-28 DIAGNOSIS — I69891 Dysphagia following other cerebrovascular disease: Secondary | ICD-10-CM | POA: Diagnosis not present

## 2020-08-28 DIAGNOSIS — W109XXA Fall (on) (from) unspecified stairs and steps, initial encounter: Secondary | ICD-10-CM | POA: Diagnosis present

## 2020-08-28 DIAGNOSIS — R1310 Dysphagia, unspecified: Secondary | ICD-10-CM | POA: Diagnosis present

## 2020-08-28 DIAGNOSIS — Z9181 History of falling: Secondary | ICD-10-CM | POA: Diagnosis not present

## 2020-08-28 DIAGNOSIS — Z7189 Other specified counseling: Secondary | ICD-10-CM | POA: Diagnosis not present

## 2020-08-28 DIAGNOSIS — F0150 Vascular dementia without behavioral disturbance: Secondary | ICD-10-CM | POA: Diagnosis present

## 2020-08-28 DIAGNOSIS — R4182 Altered mental status, unspecified: Secondary | ICD-10-CM | POA: Diagnosis present

## 2020-08-28 DIAGNOSIS — Z66 Do not resuscitate: Secondary | ICD-10-CM | POA: Diagnosis present

## 2020-08-28 DIAGNOSIS — W108XXA Fall (on) (from) other stairs and steps, initial encounter: Secondary | ICD-10-CM | POA: Diagnosis not present

## 2020-08-28 DIAGNOSIS — Z79899 Other long term (current) drug therapy: Secondary | ICD-10-CM | POA: Diagnosis not present

## 2020-08-28 DIAGNOSIS — Z515 Encounter for palliative care: Secondary | ICD-10-CM | POA: Diagnosis not present

## 2020-08-28 DIAGNOSIS — Z982 Presence of cerebrospinal fluid drainage device: Secondary | ICD-10-CM | POA: Diagnosis not present

## 2020-08-28 DIAGNOSIS — D6959 Other secondary thrombocytopenia: Secondary | ICD-10-CM | POA: Diagnosis present

## 2020-08-28 DIAGNOSIS — I10 Essential (primary) hypertension: Secondary | ICD-10-CM | POA: Diagnosis present

## 2020-08-28 DIAGNOSIS — Y92008 Other place in unspecified non-institutional (private) residence as the place of occurrence of the external cause: Secondary | ICD-10-CM | POA: Diagnosis not present

## 2020-08-28 LAB — GLUCOSE, CAPILLARY
Glucose-Capillary: 128 mg/dL — ABNORMAL HIGH (ref 70–99)
Glucose-Capillary: 133 mg/dL — ABNORMAL HIGH (ref 70–99)
Glucose-Capillary: 145 mg/dL — ABNORMAL HIGH (ref 70–99)
Glucose-Capillary: 213 mg/dL — ABNORMAL HIGH (ref 70–99)
Glucose-Capillary: 239 mg/dL — ABNORMAL HIGH (ref 70–99)

## 2020-08-28 MED ORDER — HEPARIN SODIUM (PORCINE) 5000 UNIT/ML IJ SOLN
5000.0000 [IU] | Freq: Three times a day (TID) | INTRAMUSCULAR | Status: DC
  Administered 2020-08-28 – 2020-09-22 (×75): 5000 [IU] via SUBCUTANEOUS
  Filled 2020-08-28 (×74): qty 1

## 2020-08-28 MED ORDER — SODIUM CHLORIDE 0.9 % IV SOLN
750.0000 mg | Freq: Two times a day (BID) | INTRAVENOUS | Status: DC
  Administered 2020-08-28 – 2020-09-01 (×9): 750 mg via INTRAVENOUS
  Filled 2020-08-28 (×11): qty 7.5

## 2020-08-28 NOTE — Progress Notes (Signed)
FPTS Interim Progress Note  Patient sleeping and resting comfortably. On room air vis Trach Collar.   Rounded with primary RN.  No concerns voiced.  No orders required.  Appreciated nightly round.  Today's Vitals   08/27/20 1930 08/27/20 1946 08/27/20 2321 08/28/20 0005  BP:  (!) 144/91  117/82  Pulse: 89 93 92 88  Resp: 18 18 18 16   Temp:  97.9 F (36.6 C)  (!) 97.5 F (36.4 C)  TempSrc:  Axillary  Oral  SpO2: 99% 100% 97% 96%  Weight:      PainSc:        Carollee Leitz, MD Family Medicine Residency

## 2020-08-28 NOTE — Progress Notes (Signed)
Discussed with patient's daughter [name] regarding patient status. Discussed Palliative care consult for symptom control and daughter was agreeable. Daughter is also concerned that the patient may have dementia and was wondering about further evaluation. Family is unable to fully care for patient at home and plan is for SNF.  [name], DO

## 2020-08-28 NOTE — Progress Notes (Signed)
Family Medicine Teaching Service Daily Progress Note Intern Pager: [phone]  Patient name: Stacy Moran Medical record number: [id number] Date of birth: [birth year] Age: 65 y.o. Gender: female  Primary Care Provider: [name], MD Consultants: Neurosurgery Code Status: Full  Pt Overview and Major Events to Date:  5/31 - Admitted  Assessment and Plan: Stacy Moran a 65 y.o.femalepresenting s/p fall admitted forincreasedweakness and pain since fallthis morning. 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   Agitation  Hx of recent fall due to unsteady gait and weakness Patient with baseline agitation at night, required restraints night of admission. Overnight . Home medications include Klonopin 0.5 mg twice daily. Seroquel 50 mg twice daily. -Palliative consult for symptom control -Fall precautions -Seizure precautions  -PT/OT eval and treat  -1:1 sitter -Low-threshold for EEG if patient has fluctuating mental status -SCD's for VTE ppx, may switch to subcu heparin if okay per neurosurgery -Klonopin PRN -Continue Seroquel 50 mg twice daily;can increase if more agitated  Acute on Chronic Subdural Hematoma Hx shunt Repeat CT scan showed unchanged size of b/l convexity subdural hematomas; slightly decreased mass effect on ventricles with 96mm rightward midline shift. -Neurosurgery consulted: will adjust shunt settings -We will discuss with neurosurgery about restarting subcutaneous heparin as hematoma is stable  HTN: Chronic, stable BP range in the last 24h 117-167/82-102, patient has not yet received her blood pressure medication this morning.  We will continue to monitor throughout the day and adjust medications as needed. -Vitals per protocol  -SBP goal <140 -Continue home amlodipine 10mg , hydralazine 50mg  q8h, labetalol 300mg  TID  Anemia:  Chronic, stable Hgb stable, 11.7 on 6/1. Will consider repeat CBC on 6/3 unless clinically indicated to limit blood draws..  -CBC 6/3 -Monitor change in mental status  HxSeizures: stable Related to prior aneurysm rupture. Evaluated by neurology on admission, unlikely to have been seizure and defer EEG unless change in mental status. -Seizure precautions  -Continue Keppra 1000 mg twice daily   Dysphagia, with Stacy-tub Increased Secretions -Continue scopolamine patch -SLPconsult noted high aspiration risk and recommended home health SLP follow-up -NPO; all feeds and medications per tube  GERD: Chronic, stable -Holding home Metoclopramide  Hypothyroidism s/p total thyroidectomy -Continue home synthroid 11mcg daily.   Prediabetes Hgb A1c 5.8 on admission.   FEN/GI:NPO; per tube Prophylaxis:SCD's, anticipate transition to subcutaneous heparin  Status is: Observation  The patient will require care spanning > 2 midnights and should be moved to inpatient because: Altered mental status and Unsafe d/c plan  Dispo: The patient is from: Home              Anticipated d/c is to: SNF              Patient currently is not medically stable to d/c.   Difficult to place patient No   Subjective:  Patient does not report any pain anywhere, when asked specifically about had chest she shook her head when asked if there was any pain.  Objective: Temp:  [97.5 F (36.4 C)-98.4 F (36.9 C)] 98 F (36.7 C) (06/02 0352) Pulse Rate:  [85-106] 91 (06/02 0402) Resp:  [16-20] 16 (06/02 0402) BP: (117-167)/(82-102) 146/96 (06/02 0352) SpO2:  [88 %-100 %] 88 % (06/02 0352) FiO2 (%):  [21 %-28 %] 28 % (06/02 0402) Physical Exam: General: NAD, supine in bed, minimal mumbling this morning Cardiovascular: RRR, difficult to auscultate over tracheostomy collar  oxygen Respiratory:  on 5 L oxygen via tracheostomy collar, breathing comfortably, loud expiratory sounds Abdomen: Soft,  nontender, nondistended, bowel sounds present Extremities: Moving all extremities equally and appropriate, mitt noted on left hand  Laboratory: Recent Labs  Lab 08/26/20 1052 08/26/20 1115 08/27/20 1132  WBC 5.7  --  6.1  HGB 11.4* 11.9* 11.7*  HCT 36.9 35.0* 36.9  PLT 162  --  151   Recent Labs  Lab 08/26/20 1052 08/26/20 1115  NA 137 141  K 4.5 4.6  CL 101 102  CO2 29  --   BUN 16 22  CREATININE 0.72 0.50  CALCIUM 9.6  --   PROT 6.9  --   BILITOT 0.5  --   ALKPHOS 61  --   ALT 23  --   AST 31  --   GLUCOSE 201* 194*     Imaging/Diagnostic Tests: CT HEAD WO CONTRAST  Result Date: 08/27/2020 CLINICAL DATA:  Subdural hematoma EXAM: CT HEAD WITHOUT CONTRAST TECHNIQUE: Contiguous axial images were obtained from the base of the skull through the vertex without intravenous contrast. COMPARISON:  08/26/2020 FINDINGS: Brain: Bilateral convexity subdural hematomas, measuring 10 mm on the right and 14 mm on the left, unchanged. There is a right frontal approach shunt catheter. Mass effect on the ventricles is slightly decreased. Rightward midline shift measures 4 mm. Vascular: Negative Skull: Left suboccipital craniotomy. Sinuses/Orbits: Negative Other: None IMPRESSION: 1. Unchanged size of bilateral convexity subdural hematomas. 2. Slightly decreased mass effect on the ventricles with 4 mm rightward midline shift. Electronically Signed   By: Ulyses Jarred M.D.   On: 08/27/2020 22:45     Rise Patience, DO 08/28/2020, 6:54 AM PGY-1, [place] Intern pager: [phone], text pages welcome

## 2020-08-28 NOTE — TOC Initial Note (Addendum)
CTransition of Care [facility]) - Initial/Assessment Note    Patient Details  Name: Stacy Moran MRN: [id number] Date of Birth: 1955-12-30  Transition of Care [facility]) CM/SW Contact:    Pollie Friar, RN Phone Number: 08/28/2020, 4:18 PM  Clinical Narrative:                 CM met with the patient and her daughter at the bedside. Pt unable to participate in conversation. Pt with Trach and Peg. Pt has been living with daughter but her care has become too much for the daughter. Daughter is requesting SNF rehab with transition to LTC. She is agreeable with patient being faxed out in the [facility] area.  Pt has had 2 Pfizer vaccines. TOC following and will update daughter once bed offers available.   Expected Discharge Plan: Skilled Nursing Facility Barriers to Discharge: Continued Medical Work up   Patient Goals and CMS Choice   CMS Medicare.gov Compare Post Acute Care list provided to:: Patient Represenative (must comment) Choice offered to / list presented to : Adult Children  Expected Discharge Plan and Services Expected Discharge Plan: [place] In-house Referral: Clinical Social Work Discharge Planning Services: CM Consult Post Acute Care Choice: [place] arrangements for the past 2 months: [place]                                      Prior Living Arrangements/Services Living arrangements for the past 2 months: Single Family Home Lives with:: Adult Children          Need for Family Participation in Patient Care: Yes (Comment) Care giver support system in place?: Yes (comment)      Activities of Daily Living      Permission Sought/Granted                  Emotional Assessment Appearance:: Appears stated age         Psych Involvement: No (comment)  Admission diagnosis:  Subdural hematoma ([place]) [S06.5X9A] Fall (on) (from) other stairs and steps, initial encounter [W10.8XXA] Patient Active Problem  List   Diagnosis Date Noted  . Fall (on) (from) other stairs and steps, initial encounter 08/26/2020  . Subdural hematoma ([place]) 07/31/2020  . History of hemorrhagic cerebrovascular accident (CVA) with residual deficit 06/06/2020  . 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 S/P ventriculoperitoneal shunt 06/06/2020  . Functional urinary incontinence 06/06/2020  . Ineffective airway clearance   . Thrombocytopenia ([place])   . Restlessness   . Restlessness and agitation   . Acute blood loss anemia   . Essential hypertension   . Seizure prophylaxis   . Protein-calorie malnutrition, severe 04/24/2020  . Dysphagia, post-stroke   . Benign essential HTN   . Dysphagia   . Status post tracheostomy ([place])   . S/P percutaneous endoscopic gastrostomy (PEG) tube placement ([place])   . ICH (intracerebral hemorrhage) ([place]) 04/22/2020  . Obstructive hydrocephalus ([place])   . Seizure-like activity ([place]) 03/12/2020  . PEG (percutaneous endoscopic gastrostomy) status ([place]) 03/08/2020  . Hyperglycemia 03/08/2020  . Acute respiratory failure ([place])   . Ventilator dependence ([place])   . 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 Tracheostomy status ([place]) 02/19/2020  . Abdominal distention   . 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 SAH (subarachnoid hemorrhage) ([place])   . Subarachnoid bleed ([place])   .  Tachypnea   . Prediabetes   . Hypokalemia   . Leukocytosis   . Ileus, postoperative ([place])   . Pressure injury of skin 02/13/2020  . 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 [place] [address] Phone: [phone] Fax: [place] [address] Phone: [phone] Fax: [phone]  [facility] [phone] - [place], [place] - [address] [place] [address] Phone: [phone] Fax: [phone]     Social Determinants of Health ([place]) Interventions    Readmission Risk Interventions No flowsheet data found.

## 2020-08-28 NOTE — Plan of Care (Signed)
  Problem: Safety: Goal: Non-violent Restraint(s) Outcome: Progressing   Problem: Safety: Goal: Non-violent Restraint(s) Outcome: Progressing   

## 2020-08-28 NOTE — Progress Notes (Signed)
Modified Barium Swallow Progress Note  Patient Details  Name: Stacy Moran MRN: [id number] Date of Birth: [birth year]  Today's Date: 08/28/2020  Modified Barium Swallow completed.  Full report located under Chart Review in the Imaging Section.  Brief recommendations include the following:  Clinical Impression  Pt swallowing remains profoundly impaired. She was lethargic and poorly responsive today with no active acceptance of a teaspoon of nectar thick liquids. SLP provided total assisted feeding which pt barely manipulated. After subtle lingual movement bolus gradually spilled to airway and was partially aspirated before the swallow without response. As more of the the bolus spilled and collected in the pharynx a swallow response was eventually elicited, recognizable only by minimal layrngeal elevation, hyoid excursion and laryngeal opening resulting in more aspiration without sensation. Pts anatomy is very altered. No epiglottis was appreciated. The vestibule appears closed at rest and airway patency appears generally absent. There is no UES opening. At this time potential for oral intake is very poor.  Pt needs f/u with ENT to address anatomy.   Swallow Evaluation Recommendations   Recommended Consults: Consider ENT evaluation   SLP Diet Recommendations: NPO;Alternative means - long-term                                Stacy Moran, [name] 08/28/2020,2:06 PM

## 2020-08-28 NOTE — Care Plan (Signed)
Primary team is concerned that [place] may be contributing to patient's delirium.  This was empirically increased from 500 to 1000 mg twice daily due to unwitnessed fall on admission (08/26/2020) that may have been secondary to seizure, with head imaging also revealing new ICH.  Reasonable to decrease Keppra to 750 mg twice daily and observe.  Lesleigh Noe MD-PhD [facility] [phone] Available 7 AM to 7 PM, outside these hours please contact Neurologist on call listed on AMION

## 2020-08-28 NOTE — Progress Notes (Signed)
  NEUROSURGERY PROGRESS NOTE   No issues overnight.   EXAM:  BP 117/87 (BP Location: Left Arm)   Pulse 80   Temp 98.1 F (36.7 C) (Oral)   Resp 16   Wt 55.5 kg   SpO2 96%   BMI 19.75 kg/m   Awake, alert Responds appropriately Moves all extremities well Shunt pumps and refills briskly  IMAGING: MRI brain reviewed,  CTH reviewed. Stable appearance of bilateral chronic SDH with minor acute component on the left.  IMPRESSION:  65 y.o. female s/p fall with bilateral chronic SDH and minor left-sided acute component. Hx of communicating HCP s/p VPS.  PLAN: - Strata valve adjusted to 2.0, previously set at 1.5 - Ok to start prophylactic sub-Q heparin - Dispo planning - man need SNF level of care   [name], MD [facility]

## 2020-08-28 NOTE — NC FL2 (Signed)
[place] LEVEL OF CARE SCREENING TOOL     IDENTIFICATION  Patient Name: Stacy Moran Birthdate: Apr 06, 1955 Sex: female Admission Date (Current Location): 08/26/2020  [facility] and [place] Number:  Herbalist and Address:  [facility]. [facility], [place] [address], [place], [place] [address]      Provider Number: [id number]  Attending Physician Name and Address:  Leeanne Rio, MD  Relative Name and Phone Number:       Current Level of Care: Hospital Recommended Level of Care: [place] Prior Approval Number:    Date Approved/Denied:   PASRR Number: [id number]  Discharge Plan: SNF    Current Diagnoses: Patient Active Problem List   Diagnosis Date Noted  . Fall (on) (from) other stairs and steps, initial encounter 08/26/2020  . Subdural hematoma ([place]) 07/31/2020  . History of hemorrhagic cerebrovascular accident (CVA) with residual deficit 06/06/2020  . 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 S/P ventriculoperitoneal shunt 06/06/2020  . Functional urinary incontinence 06/06/2020  . Ineffective airway clearance   . Thrombocytopenia ([place])   . Restlessness   . Restlessness and agitation   . Acute blood loss anemia   . Essential hypertension   . Seizure prophylaxis   . Protein-calorie malnutrition, severe 04/24/2020  . Dysphagia, post-stroke   . Benign essential HTN   . Dysphagia   . Status post tracheostomy ([place])   . S/P percutaneous endoscopic gastrostomy (PEG) tube placement ([place])   . ICH (intracerebral hemorrhage) ([place]) 04/22/2020  . Obstructive hydrocephalus ([place])   . Seizure-like activity ([place]) 03/12/2020  . PEG (percutaneous endoscopic gastrostomy) status ([place]) 03/08/2020  . Hyperglycemia 03/08/2020  . Acute respiratory failure ([place])   . Ventilator dependence ([place])   . 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 Tracheostomy status ([place])  02/19/2020  . Abdominal distention   . 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 SAH (subarachnoid hemorrhage) ([place])   . Subarachnoid bleed ([place])   . Tachypnea   . Prediabetes   . Hypokalemia   . Leukocytosis   . Ileus, postoperative ([place])   . Pressure injury of skin 02/13/2020  . 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    Orientation RESPIRATION BLADDER Height & Weight        Tracheostomy,O2 (see d/c summary for oxygen needs) Incontinent Weight: 55.5 kg Height:     BEHAVIORAL SYMPTOMS/MOOD NEUROLOGICAL BOWEL NUTRITION STATUS    Convulsions/Seizures Incontinent Feeding tube (Peg: Osmolite 1.2 bolus feeds --355 ml 4x/day)  AMBULATORY STATUS COMMUNICATION OF NEEDS Skin   Extensive Assist Non-Verbally Normal                       Personal Care Assistance Level of Assistance  Bathing,Feeding,Dressing Bathing Assistance: Maximum assistance Feeding assistance: Maximum assistance Dressing Assistance: Maximum assistance     Functional Limitations Info  Sight,Hearing,Speech Sight Info: Adequate Hearing Info: Adequate Speech Info: Impaired (dysarthria)    SPECIAL CARE FACTORS FREQUENCY  PT (By licensed PT),OT (By licensed OT),Speech therapy     PT Frequency: 5x/wk OT Frequency: 5x/wk     Speech Therapy Frequency: 5x/wk      Contractures Contractures Info: Not present    Additional Factors Info  Code Status,Allergies,Psychotropic Code Status Info: Full Allergies Info: NKA Psychotropic Info: Seroquel 50 mg BiD         Current Medications (08/28/2020):  This is the current hospital active medication list Current Facility-Administered Medications  Medication  Dose Route Frequency Provider Last Rate Last Admin  . acetaminophen (TYLENOL) tablet 650 mg  650 mg Per Tube Q6H PRN Delora Fuel, MD   650 mg at 08/26/20 1959  . amLODipine (NORVASC) tablet 10 mg  10 mg Per Tube Daily Espinoza, Alejandra, DO   10 mg at 08/28/20 1046  . chlorhexidine (PERIDEX) 0.12 %  solution 15 mL  15 mL Mouth Rinse BID Berta Minor I, CCC-SLP   15 mL at 08/28/20 1047  . clonazePAM (KLONOPIN) tablet 0.5 mg  0.5 mg Per Tube QHS PRN Alcus Dad, MD      . feeding supplement (OSMOLITE 1.2 CAL) liquid 355 mL  355 mL Per Tube TID WC & HS Espinoza, Alejandra, DO 0 mL/hr at 08/26/20 1824 355 mL at 08/28/20 1324  . feeding supplement (PROSource TF) liquid 45 mL  45 mL Per Tube BID Espinoza, Alejandra, DO   45 mL at 08/28/20 1047  . free water 200 mL  200 mL Per Tube Q6H Espinoza, Alejandra, DO   200 mL at 08/28/20 1324  . glycopyrrolate (ROBINUL) tablet 2 mg  2 mg Per Tube BID Alcus Dad, MD   2 mg at 08/28/20 1046  . heparin injection 5,000 Units  5,000 Units Subcutaneous Q8H Brimage, Vondra, DO      . hydrALAZINE (APRESOLINE) tablet 50 mg  50 mg Per Tube Q8H Espinoza, Alejandra, DO   50 mg at 08/28/20 1324  . labetalol (NORMODYNE) tablet 300 mg  300 mg Per Tube TID Nita Sells, Alejandra, DO   300 mg at 08/28/20 1046  . levETIRAcetam (KEPPRA) 750 mg in sodium chloride 0.9 % 100 mL IVPB  750 mg Intravenous Q12H Autry-Lott, Simone, DO      . levothyroxine (SYNTHROID) tablet 100 mcg  100 mcg Per Tube Q0600 Espinoza, Alejandra, DO   100 mcg at 08/28/20 0601  . MEDLINE mouth rinse  15 mL Mouth Rinse q12n4p Sherian, Valenza I, CCC-SLP   15 mL at 08/28/20 1325  . QUEtiapine (SEROQUEL) tablet 50 mg  50 mg Per Tube BID Espinoza, Alejandra, DO   50 mg at 08/28/20 1047  . sodium chloride flush (NS) 0.9 % injection 3 mL  3 mL Intravenous Once Sharion Settler, DO         Discharge Medications: Please see discharge summary for a list of discharge medications.  Relevant Imaging Results:  Relevant Lab Results:   Additional Information SS#: [id number]  Pollie Friar, RN

## 2020-08-29 DIAGNOSIS — S065X9A Traumatic subdural hemorrhage with loss of consciousness of unspecified duration, initial encounter: Secondary | ICD-10-CM | POA: Diagnosis not present

## 2020-08-29 DIAGNOSIS — I69891 Dysphagia following other cerebrovascular disease: Secondary | ICD-10-CM | POA: Diagnosis not present

## 2020-08-29 DIAGNOSIS — R739 Hyperglycemia, unspecified: Secondary | ICD-10-CM | POA: Diagnosis not present

## 2020-08-29 DIAGNOSIS — D696 Thrombocytopenia, unspecified: Secondary | ICD-10-CM

## 2020-08-29 DIAGNOSIS — R451 Restlessness and agitation: Secondary | ICD-10-CM

## 2020-08-29 DIAGNOSIS — Z7189 Other specified counseling: Secondary | ICD-10-CM

## 2020-08-29 DIAGNOSIS — W108XXA Fall (on) (from) other stairs and steps, initial encounter: Secondary | ICD-10-CM | POA: Diagnosis not present

## 2020-08-29 DIAGNOSIS — Z515 Encounter for palliative care: Secondary | ICD-10-CM

## 2020-08-29 LAB — BASIC METABOLIC PANEL
Anion gap: 10 (ref 5–15)
BUN: 18 mg/dL (ref 8–23)
CO2: 26 mmol/L (ref 22–32)
Calcium: 9.5 mg/dL (ref 8.9–10.3)
Chloride: 104 mmol/L (ref 98–111)
Creatinine, Ser: 0.57 mg/dL (ref 0.44–1.00)
GFR, Estimated: >60 mL/min (ref >60)
Glucose, Bld: 101 mg/dL — ABNORMAL HIGH (ref 70–99)
Potassium: 4.1 mmol/L (ref 3.5–5.1)
Sodium: 140 mmol/L (ref 135–145)

## 2020-08-29 LAB — CBC
HCT: 36.2 % (ref 36.0–46.0)
Hemoglobin: 11.3 g/dL — ABNORMAL LOW (ref 12.0–15.0)
MCH: 25.6 pg — ABNORMAL LOW (ref 26.0–34.0)
MCHC: 31.2 g/dL (ref 30.0–36.0)
MCV: 82.1 fL (ref 80.0–100.0)
Platelets: 152 10*3/uL (ref 150–400)
RBC: 4.41 MIL/uL (ref 3.87–5.11)
RDW: 14.4 % (ref 11.5–15.5)
WBC: 6.2 10*3/uL (ref 4.0–10.5)
nRBC: 0 % (ref 0.0–0.2)

## 2020-08-29 LAB — GLUCOSE, CAPILLARY
Glucose-Capillary: 113 mg/dL — ABNORMAL HIGH (ref 70–99)
Glucose-Capillary: 115 mg/dL — ABNORMAL HIGH (ref 70–99)
Glucose-Capillary: 116 mg/dL — ABNORMAL HIGH (ref 70–99)
Glucose-Capillary: 166 mg/dL — ABNORMAL HIGH (ref 70–99)
Glucose-Capillary: 177 mg/dL — ABNORMAL HIGH (ref 70–99)
Glucose-Capillary: 192 mg/dL — ABNORMAL HIGH (ref 70–99)

## 2020-08-29 NOTE — Progress Notes (Signed)
Family Medicine Teaching Service Daily Progress Note Intern Pager: [phone]  Patient name: Stacy Moran Medical record number: [id number] Date of birth: [birth year] Age: 65 y.o. Gender: female  Primary Care Provider: [name], MD Consultants: Neurosurgery Code Status: Full  Pt Overview and Major Events to Date:  5/31 - Admitted  Assessment and Plan: Stacy Moran a 65 y.o.femalepresenting s/p fall admitted forincreasedweakness and pain since fallthis morning. 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   Agitation  Hx of recent fall due to unsteady gait and weakness Patient remains somewhat agitated and attempting to get out of bed persistently at night.  Has still been having mitts and lapbelt on and telemetry sitter as there is not much staff available. Home medications include Klonopin 0.5 mg twice daily. Seroquel 50 mg twice daily. -Palliative consult for symptom control -Fall precautions -Seizure precautions  -PT/OT eval and treat  -1:1 sitter -Low-threshold for EEG if patient has fluctuating mental status -VTE PPx subcu heparin -Klonopin PRN -Continue Seroquel 50 mg twice daily;can increase if more agitated  Acute on Chronic Subdural Hematoma Hx shunt Repeat imaging with stable b/l hematomas.  Patient strata valve was adjusted by neurosurgery on 6/2 to 2.0, previously 1.5. -Neurosurgery consulted, appreciate recs -VTE PPx with subcutaneous heparin  HTN: Chronic, stable BP range in the last 24h 117-161/80-114. -Vitals per protocol  -SBP goal <140 -Continue home amlodipine 10mg , hydralazine 50mg  q8h, labetalol 300mg  TID  Anemia: Chronic, stable Hgb stable at 11.3, will recheck CBC on 6/5 unless clinically indicated -CBC 6/5 -Monitor change in mental status  HxSeizures: stable Related to prior aneurysm rupture.  -Seizure  precautions  -Continue Keppra 1000 mg twice daily   Dysphagia, with PEG-tub Increased Secretions -SLPconsult noted high aspiration risk and recommended home health SLP follow-up -NPO; all feeds and medications per tube -Glycopyrrolate 2mg  daily  GERD: Chronic, stable -Holding home Metoclopramide  Hypothyroidism s/p total thyroidectomy -Continue home synthroid 131mcg daily.   Prediabetes Hgb A1c 5.8 on admission.   FEN/GI:NPO; per tube Prophylaxis:SCD's, anticipate transition to subcutaneous heparin  Status is: Observation  The patient will require care spanning > 2 midnights and should be moved to inpatient because: Altered mental status and Unsafe d/c plan  Dispo: The patient is from: Home              Anticipated d/c is to: SNF              Patient currently is not medically stable to d/c.   Difficult to place patient No   Subjective:  Patient states that she wants to go home, she does not feel safe with the staff here.  Difficult to understand but patient did not seem to want to have her daughter call or come in today.  Objective: Temp:  [97.5 F (36.4 C)-98.3 F (36.8 C)] 98.1 F (36.7 C) (06/03 0700) Pulse Rate:  [80-103] 103 (06/03 0850) Resp:  [15-16] 16 (06/03 0850) BP: (117-161)/(80-114) 153/114 (06/03 0700) SpO2:  [93 %-98 %] 98 % (06/03 0700) FiO2 (%):  [21 %] 21 % (06/03 0337) Physical Exam: General: Sitting up in bed, initially crying out somewhat and trying to get out of bed, somewhat irritated during discussion Cardiovascular: Patient declined exam Respiratory:  on 5 L oxygen via tracheostomy collar, breathing comfortably, loud expiratory sounds Abdomen: Patient declined exam Extremities: Moving all extremities equally and appropriate, mitts on both hands  Laboratory:  Recent Labs  Lab 08/26/20 1052 08/26/20 1115 08/27/20 1132 08/29/20 0208  WBC 5.7  --  6.1 6.2  HGB 11.4* 11.9* 11.7* 11.3*  HCT 36.9 35.0* 36.9 36.2  PLT 162  --   151 152   Recent Labs  Lab 08/26/20 1052 08/26/20 1115 08/29/20 0645  NA 137 141 140  K 4.5 4.6 4.1  CL 101 102 104  CO2 29  --  26  BUN 16 22 18   CREATININE 0.72 0.50 0.57  CALCIUM 9.6  --  9.5  PROT 6.9  --   --   BILITOT 0.5  --   --   ALKPHOS 61  --   --   ALT 23  --   --   AST 31  --   --   GLUCOSE 201* 194* 101*     Imaging/Diagnostic Tests: DG Swallowing Func-Speech Pathology  Result Date: 08/28/2020 Objective Swallowing Evaluation: Type of Study: MBS-Modified Barium Swallow Study  Patient Details Name: Stacy Moran MRN: [id number] Date of Birth: 1955/05/06 Today's Date: 08/28/2020 Time: SLP Start Time (ACUTE ONLY): 1200 -SLP Stop Time (ACUTE ONLY): 1215 SLP Time Calculation (min) (ACUTE ONLY): 15 min Past Medical History: Past Medical History: Diagnosis Date . Prosthetic eye globe  . 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 Status post insertion of percutaneous endoscopic gastrostomy (PEG) tube ([place])  . Subdural hematoma ([place])  . Tracheostomy dependent [facility])  Past Surgical History: Past Surgical History: Procedure Laterality Date . BURR HOLE N/A 03/18/2020  Procedure: [place] Flirt;  Surgeon: [name], MD;  Location: [place];  Service: Neurosurgery;  Laterality: N/A; . CRANIOTOMY Left 01/30/2020  Procedure: LEFT FAR LATERAL CRANIOTOMY FOR ANEURYSM CLIPPING;  Surgeon: [name], MD;  Location: [place];  Service: Neurosurgery;  Laterality: Left; . DIRECT LARYNGOSCOPY N/A 02/29/2020  Procedure: DIRECT LARYNGOSCOPY;  Surgeon: [name], MD;  Location: [place];  Service: ENT;  Laterality: N/A; . IR ANGIO INTRA EXTRACRAN SEL INTERNAL CAROTID BILAT MOD SED  01/30/2020 . IR ANGIO VERTEBRAL SEL VERTEBRAL UNI L MOD SED  01/30/2020 . IR CM INJ ANY COLONIC TUBE W/FLUORO  07/03/2020 . IR GASTROSTOMY TUBE MOD SED  02/22/2020 . IR [place] TUBE PERCUT W/FLUORO  07/03/2020 . LAPAROSCOPIC REVISION VENTRICULAR-PERITONEAL (V-P) SHUNT N/A 03/18/2020  Procedure: LAPAROSCOPIC REVISION  VENTRICULAR-PERITONEAL (V-P) SHUNT;  Surgeon: [name], MD;  Location: [place];  Service: General;  Laterality: N/A; . RADIOLOGY WITH ANESTHESIA N/A 01/30/2020  Procedure: IR WITH ANESTHESIA;  Surgeon: [name], MD;  Location: [place];  Service: Radiology;  Laterality: N/A; . THYROIDECTOMY N/A 02/08/2020  Procedure: THYROIDECTOMY;  Surgeon: [name], MD;  Location: [place];  Service: ENT;  Laterality: N/A; . TRACHEOSTOMY TUBE PLACEMENT N/A 02/08/2020  Procedure: TRACHEOSTOMY;  Surgeon: [name], MD;  Location: [place];  Service: ENT;  Laterality: N/A; . TRACHEOSTOMY TUBE PLACEMENT N/A 02/29/2020  Procedure: TRACHEOSTOMY EXCHANGE;  Surgeon: [name], MD;  Location: [place];  Service: ENT;  Laterality: N/A; . VENTRICULOPERITONEAL SHUNT N/A 03/18/2020  Procedure: RIGHT SHUNT INSERTION VENTRICULAR-PERITONEAL/ BURR HOLE Evacuation of Subdural Hematoma;  Surgeon: [name], MD;  Location: [place];  Service: Neurosurgery;  Laterality: N/A; HPI: Pt is a 65 year old woman with who presented with acute worsening of subdural hematomas after fall. MRI brain 5/31: "Unchanged bilateral subdural hematomas and trace rightward midline shift." Neurosurgery considering increasing the setting on her shunt as of 5/31. Pt d/c from CIR NPO with PEG has not comlpeted any SLP therapy or swallow evaluations  since 05/20/20. At that time pt still not tolearting PMSV well. 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  MBS 02/19/20 profound dysphagia with abnormal anatomy. Repeat MBS 02/26/21 minimal improvement- decr edema however significant subglottic edema/stenosis narrowing subglottic space. Bolus stops at UES with minimal opening with aspiration during/post swalow- continue NPO. MBS 04/09/20: severe pharyngoesophageal dysphagia with minimal improvements. subglottic edema was  decreased but UES function continued to be significantly impaired in addition to tongue base retraction and pharyngeal contraction all contributing to pharyngeal stasis. Pt was also seen ENT. On 02/29/20 Dr Constance Holster performed laryngoscopy reporting "The cords were thickened and swollen.  There is significant subglottic swelling especially on the left side and posteriorly.  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  As the anesthesia was wearing off the cords were inspected.  Deep suctioning through the tracheostomy was used to elicit a cough.  There definitely seem to be movement of the right cord.  The left cord may be paretic but it is a little hard to tell... Over the next several months we can continue evaluation to see how the subglottic airway grasses.  She may need future procedures to open the subglottis if necessary"  No data recorded Assessment / Plan / Recommendation CHL IP CLINICAL IMPRESSIONS 08/28/2020  Pt swallowing remains profoundly impaired. She was lethargic and poorly responsive today with no active acceptance of a teaspoon of nectar thick liquids. SLP provided total assisted feeding which pt barely manipulated. After subtle lingual movement bolus gradually spilled to airway and was partially aspirated before the swallow without response. As more of the the bolus spilled and collected in the pharynx a swallow response was eventually elicited, recognizable only by minimal layrngeal elevation, hyoid excursion and laryngeal opening resulting in more aspiration without sensation. Pts anatomy is very altered. No epiglottis was appreciated. The vestibule appears closed at rest and airway patency appears generally absent. There is no UES opening. At this time potential for oral intake is very poor.  Pt needs f/u with ENT to address anatomy.    SLP Visit Diagnosis -- Attention and concentration deficit following -- Frontal lobe and executive function deficit following -- Impact on safety and function --   CHL  IP TREATMENT RECOMMENDATION 08/28/2020 Treatment Recommendations Therapy as outlined in treatment plan below   Prognosis 08/28/2020 Prognosis for Safe Diet Advancement Guarded Barriers to Reach Goals Severity of deficits Barriers/Prognosis Comment -- CHL IP DIET RECOMMENDATION 08/28/2020 SLP Diet Recommendations NPO;Alternative means - long-term Liquid Administration via -- Medication Administration -- Compensations -- Postural Changes --   CHL IP OTHER RECOMMENDATIONS 08/28/2020 Recommended Consults Consider ENT evaluation Oral Care Recommendations -- Other Recommendations --   CHL IP FOLLOW UP RECOMMENDATIONS 08/28/2020 Follow up Recommendations Home health SLP   CHL IP FREQUENCY AND DURATION 08/28/2020 Speech Therapy Frequency (ACUTE ONLY) min 2x/week Treatment Duration 2 weeks      CHL IP ORAL PHASE 08/28/2020 Oral Phase Impaired Oral - Pudding Teaspoon -- Oral - Pudding Cup -- Oral - Honey Teaspoon -- Oral - Honey Cup -- Oral - Nectar Teaspoon Left anterior bolus loss;Right anterior bolus loss;Weak lingual manipulation;Incomplete tongue to palate contact;Reduced posterior propulsion;Holding of bolus;Delayed oral transit;Decreased bolus cohesion;Premature spillage Oral - Nectar Cup -- Oral - Nectar Straw -- Oral - Thin Teaspoon -- Oral - Thin Cup -- Oral - Thin Straw -- Oral - Puree -- Oral -  Mech Soft -- Oral - Regular -- Oral - Multi-Consistency -- Oral - Pill -- Oral Phase - Comment --  CHL IP PHARYNGEAL PHASE 08/28/2020 Pharyngeal Phase Impaired Pharyngeal- Pudding Teaspoon -- Pharyngeal -- Pharyngeal- Pudding Cup -- Pharyngeal -- Pharyngeal- Honey Teaspoon -- Pharyngeal -- Pharyngeal- Honey Cup -- Pharyngeal -- Pharyngeal- Nectar Teaspoon Penetration/Aspiration before swallow;Penetration/Aspiration during swallow;Reduced epiglottic inversion;Reduced tongue base retraction;Reduced laryngeal elevation;Reduced anterior laryngeal mobility;Moderate aspiration Pharyngeal -- Pharyngeal- Nectar Cup -- Pharyngeal -- Pharyngeal-  Nectar Straw -- Pharyngeal -- Pharyngeal- Thin Teaspoon -- Pharyngeal -- Pharyngeal- Thin Cup -- Pharyngeal -- Pharyngeal- Thin Straw -- Pharyngeal -- Pharyngeal- Puree -- Pharyngeal -- Pharyngeal- Mechanical Soft -- Pharyngeal -- Pharyngeal- Regular -- Pharyngeal -- Pharyngeal- Multi-consistency -- Pharyngeal -- Pharyngeal- Pill -- Pharyngeal -- Pharyngeal Comment --  CHL IP CERVICAL ESOPHAGEAL PHASE 04/09/2020 Cervical Esophageal Phase Impaired Pudding Teaspoon -- Pudding Cup -- Honey Teaspoon -- Honey Cup -- Nectar Teaspoon -- Nectar Cup -- Nectar Straw -- Thin Teaspoon -- Thin Cup -- Thin Straw -- Puree -- Mechanical Soft -- Regular -- Multi-consistency -- Pill -- Cervical Esophageal Comment -- [name] 08/28/2020, 2:07 PM                Rise Patience, DO 08/29/2020, 8:59 AM PGY-1, [place] Intern pager: [phone], text pages welcome

## 2020-08-29 NOTE — Progress Notes (Signed)
Physical Therapy Treatment Patient Details Name: Stacy Moran MRN: [id number] DOB: November 16, 1955 Today's Date: 08/29/2020    History of Present Illness 65 y/o female presented to ED on 5/31 with generalized waekness and L arm pain/weakness s/p fall down the stairs. 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 Radiographs negative for acute fxs. PMH: SAH s/p PICA aneurysm clipping, bilateral SDH, seizures, chronic tracheostomy, PEG, seizures, HTN, GERD, anxiety    PT Comments    Pt sitting EOB on arrival with posey belt still in place. Pt stating "I want to go home today." Agreeable to participation in therapy and OOB to recliner. Pt required +2 min assist sit to stand and +2 mod HHA ambulation 4'. Knee buckling noted bilat. Pt positioned in recliner with feet elevated. Posey belt and bilat mitts in place.     Follow Up Recommendations  SNF;Supervision/Assistance - 24 hour     Equipment Recommendations  None recommended by PT    Recommendations for Other Services       Precautions / Restrictions Precautions Precautions: Fall;Other (comment) Precaution Comments: trach, PEG    Mobility  Bed Mobility               General bed mobility comments: Pt sitting EOB on arrival with posey belt still in place.    Transfers Overall transfer level: Needs assistance Equipment used: 2 person hand held assist Transfers: Sit to/from Stand Sit to Stand: Min assist;+2 physical assistance;+2 safety/equipment         General transfer comment: assist to power up and stabilize balance  Ambulation/Gait Ambulation/Gait assistance: +2 safety/equipment;+2 physical assistance;Mod assist Gait Distance (Feet): 4 Feet Assistive device: 2 person hand held assist Gait Pattern/deviations: Narrow base of support;Step-through pattern;Decreased stride length Gait velocity: decreased   General Gait Details: unsteady gait. Knee buckling  noted bilat. +2 assist to maintain balance. Amb bed to recliner.   Stairs             Wheelchair Mobility    Modified Rankin (Stroke Patients Only)       Balance Overall balance assessment: Needs assistance Sitting-balance support: Feet supported;Bilateral upper extremity supported Sitting balance-Leahy Scale: Fair     Standing balance support: Bilateral upper extremity supported;During functional activity Standing balance-Leahy Scale: Poor Standing balance comment: reliant on external support                            Cognition Arousal/Alertness: Awake/alert Behavior During Therapy: Flat affect Overall Cognitive Status: History of cognitive impairments - at baseline                                 General Comments: difficult to understand due to soft spoken, garbled speech. PMV in place during session. Following commands with increased time.      Exercises      General Comments General comments (skin integrity, edema, etc.): 5L with 28% FiO2 via trach collar. PMV in place. SpO2 98%.      Pertinent Vitals/Pain Pain Assessment: Faces Faces Pain Scale: No hurt    Home Living                      Prior Function            PT Goals (current goals can now be found in the care plan section)  Acute Rehab PT Goals Patient Stated Goal: home Progress towards PT goals: Progressing toward goals    Frequency    Min 3X/week      PT Plan Current plan remains appropriate    Co-evaluation              AM-PAC PT "6 Clicks" Mobility   Outcome Measure  Help needed turning from your back to your side while in a flat bed without using bedrails?: A Little Help needed moving from lying on your back to sitting on the side of a flat bed without using bedrails?: A Little Help needed moving to and from a bed to a chair (including a wheelchair)?: A Little Help needed standing up from a chair using your arms (e.g., wheelchair or  bedside chair)?: A Little Help needed to walk in hospital room?: A Little Help needed climbing 3-5 steps with a railing? : A Lot 6 Click Score: 17    End of Session Equipment Utilized During Treatment: Gait belt;Oxygen Activity Tolerance: Patient tolerated treatment well Patient left: in chair;with call bell/phone within reach;with chair alarm set;with restraints reapplied Nurse Communication: Mobility status PT Visit Diagnosis: Unsteadiness on feet (R26.81);Muscle weakness (generalized) (M62.81);Other abnormalities of gait and mobility (R26.89);Other symptoms and signs involving the nervous system (R29.898);Ataxic gait (R26.0)     Time: 4585-9292 PT Time Calculation (min) (ACUTE ONLY): 15 min  Charges:  $Therapeutic Activity: 8-22 mins                     Lorrin Goodell, PT  Office # [phone] Pager [phone]    [name] 08/29/2020, 12:56 PM

## 2020-08-29 NOTE — Consult Note (Signed)
Consultation Note Date: 08/29/2020   Patient Name: Stacy Moran  DOB: 1955-07-08  MRN: [id number]  Age / Sex: 65 y.o., female  PCP: [name], MD Referring Physician: Leeanne Rio, MD  Reason for Consultation: Establishing goals of care  HPI/Patient Profile: 65 y.o. 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  Diagnosed with acute on chronic subdural hematoma. PMT consulted for assistance with symptom management.   Clinical Assessment and Goals of Care: I have reviewed medical records including EPIC notes, labs and imaging, received report from RN, assessed the patient and then met with patient  to discuss diagnosis prognosis, GOC, EOL wishes, disposition and options.  Patient is sitting up in chair, appears calm and cooperative. She does indicate she's  Not having a good day - ready to be out of the hospital. She denies pain. Further conversation, specifically regarding goals of care is difficult d/t her trach. I ask her if I can call her daughter and she is quite hesitant, eventually shrugs her shoulders.  Spoke with nurse who tells me patient is mostly oriented - some periods of confusion. No agitation today per nurse. No excessive secretions today.   I attempted to call daughter at home to further discuss goals of care and also assess patient's symptoms outpatient however she did not answer and has not returned my call.   Of note, palliative medicine did see patient multiple times in December 2021 to discuss goals of care during hospitalization. During those meetings daughter was clear with intention to continue aggressive care, maintain full code/full scope treatment.   Primary Decision Maker PATIENT? Capacity to make decisions likely fluctuates.  Daughter is designated Media planner if patient unable.   SUMMARY OF RECOMMENDATIONS   -Patient is calm and cooperative during my visit and nurse reports same throughout the day - if symptoms persist at night could consider increasing qhs clonazepam dose to 1 mg, respiratory secretions also not reported to be a problem today with scheduled robinul - patient is appropriate for further goals of care conversations however she is unable to participate in these conversations independently and daughter unavailable at this time - consider outpatient palliative referral at SNF  Code Status/Advance Care Planning:  Full code  Prognosis:   Unable to determine  Discharge Planning: To Be Determined      Primary Diagnoses: Present on Admission: . Fall (on) (from) other stairs and steps, initial encounter . Subdural hematoma ([place]) . Hyperglycemia . Thrombocytopenia ([place])   I have reviewed the medical record, interviewed the patient and family, and examined the patient. The following aspects are pertinent.  Past Medical History:  Diagnosis Date  . Prosthetic eye globe   . Ruptured aneurysm of artery ([place])   . Status post insertion of percutaneous endoscopic gastrostomy (PEG) tube ([place])   . Subdural hematoma ([place])   . Tracheostomy dependent [facility])    Social History   Socioeconomic History  . Marital status: Single    Spouse name: Not on file  . Number of children: Not on file  . Years of education: Not on file  . Highest education level: Not on file  Occupational History  . Not on file  Tobacco Use  . Smoking status: Former Research scientist (life sciences)  . Smokeless tobacco: Never Used  Vaping Use  . Vaping Use: Never used  Substance and  Sexual Activity  . Alcohol use: Not Currently  . Drug use: Not Currently  . Sexual activity: Not on file  Other Topics Concern  . Not on file  Social History Narrative  . Not on file   Social Determinants of Health   Financial Resource Strain: Not on file   Food Insecurity: Not on file  Transportation Needs: Not on file  Physical Activity: Not on file  Stress: Not on file  Social Connections: Not on file   History reviewed. No pertinent family history. Scheduled Meds: . amLODipine  10 mg Per Tube Daily  . chlorhexidine  15 mL Mouth Rinse BID  . feeding supplement (OSMOLITE 1.2 CAL)  355 mL Per Tube TID WC & HS  . feeding supplement (PROSource TF)  45 mL Per Tube BID  . free water  200 mL Per Tube Q6H  . glycopyrrolate  2 mg Per Tube BID  . heparin  5,000 Units Subcutaneous Q8H  . hydrALAZINE  50 mg Per Tube Q8H  . labetalol  300 mg Per Tube TID  . levothyroxine  100 mcg Per Tube Q0600  . mouth rinse  15 mL Mouth Rinse q12n4p  . QUEtiapine  50 mg Per Tube BID  . sodium chloride flush  3 mL Intravenous Once   Continuous Infusions: . levETIRAcetam 750 mg (08/29/20 0931)   PRN Meds:.acetaminophen, clonazePAM No Known Allergies Review of Systems  Unable to perform ROS: Patient nonverbal    Physical Exam Constitutional:      General: She is not in acute distress. Pulmonary:     Effort: Pulmonary effort is normal.  Skin:    General: Skin is warm and dry.  Neurological:     Mental Status: She is alert.     Comments: Periods of confusion     Vital Signs: BP (!) 115/93 (BP Location: Left Arm)   Pulse 91   Temp (!) 97.4 F (36.3 C) (Oral)   Resp 16   Wt 55.5 kg   SpO2 98%   BMI 19.75 kg/m  Pain Scale: PAINAD POSS *See Group Information*: 1-Acceptable,Awake and alert Pain Score: 0-No pain   SpO2: SpO2: 98 % O2 Device:SpO2: 98 % O2 Flow Rate: .O2 Flow Rate (L/min): 5 L/min  IO: Intake/output summary:   Intake/Output Summary (Last 24 hours) at 08/29/2020 1442 Last data filed at 08/29/2020 0220 Gross per 24 hour  Intake 491.91 ml  Output 1150 ml  Net -658.09 ml    LBM: Last BM Date:  (PTA) Baseline Weight: Weight: 55.5 kg Most recent weight: Weight: 55.5 kg     Palliative Assessment/Data: PPS 50%    Time  Total: 45 minutes Greater than 50%  of this time was spent counseling and coordinating care related to the above assessment and plan.  Juel Burrow, DNP, AGNP-C Palliative Medicine Team [phone] Pager: [phone]

## 2020-08-29 NOTE — Progress Notes (Signed)
Family Medicine Teaching Service Daily Progress Note Intern Pager: [phone]  Patient name: Stacy Moran Medical record number: [id number] Date of birth: 19-Jun-1955 Age: 65 y.o. Gender: female  Primary Care Provider: [name], MD Consultants: None Code Status:   Pt Overview and Major Events to Date:  05/31-Admitted to FPTS  Assessment and Plan: Stacy Moran a 65 y.o.femalepresenting s/p fall admitted forincreasedweakness and pain since fallthis morning. 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   Agitation  Hx of recent fall due to unsteady gait and weakness Stable.RN reports not as agitated overnight.  Bed belt has helped with safety along with Telesitter.  Mittens in place to prevent pulling at lines and trach.  Did not require Clonazepam last night. -Continue Clonazepam 0.5mg  qhs prn -Continue Seroquel 50 mg BID -ECG weekly to monitor for QTc -Fall Precautions -Delirium Precautions -PT/OT following, appreciate recommendations -Telesitter, 1:1 sitter   [place] Palliative care evaluated yesterday.  Reached out to daughter but was unsuccessful and daughter has not returned call yet.  Recommended increase in Clonazepam to 1 mg.  Consider outpatient palliative referral at SNF.   -Follow up with Palliative when able to discuss [place] with daughter  Acute on Chronic Subdural Hematoma Hx shunt Stable: Repeat imaging shows no changes to bilateral hematomas from previous. VP Shunt valve adjusted on 06/02 by Neurosurgery -Neurosurgery recs SNF level of care -Monitor for worsening mentation  HTN: Stable. SBP stable overnight and at goal. -Continue to monitor -Continue Amlodipine 10 mg daily -Continue Hydralazine 50 mg TID -Continue Labetalol 300 mg TID -BMP 06/05  Normocytic Anemia:  Stable.  Hbg 11.3, MCV normal.  Iron studies wnl -Monitor  for bleeding -CBC 06/05  HxSeizures Stable.  No seizure activity overnight.   -Seizure precautions -Monitor mentation -Continue Keppra 1000 mg BID -EEG if mentation changes -BMP 06/05  Dysphagia, with PEG-tub  Stable. Bolus tube feeds.  Minimal secretions overnight. -Speech following, high risk for aspiration.  Recs HH speech follow up outpatient -NPO -Frequent oral care -Continue Osmolite 1.2 335cc QID with  Free water flush 200 cc q6h.  Trach dependent s/p tracheostomy Stable.  Secretions minimal overnight.  Pessimer valve in place.  No oxygen requirement overnight. -Continue Robinul 2 mg  BID -Oral/Trach Suction as needed -Monitor for mucus plugs  GERD: Stable. Home medication Reglan 5 mg TID.  Held since admission and no complaints of abdominal pain. -Consider H2 antagonist if any s/s recurrent GERD -Recommend discontinuing Reglan given that long term use increases risk of Tardive Dyskinesia   Hypothyroidism s/p total thyroidectomy Stable. Last TSH 4.39 (03/22) -Continue Synthroid 100 mcg daily   FEN/GI:NPO, PEG tube- Osmolite 1.2 335 cc QID with 200 cc Free Water flush Prophylaxis:Heparin subcutaneous   Status is: Inpatient  Remains inpatient appropriate because:Altered mental status and Unsafe d/c plan   Dispo: The patient is from: Home              Anticipated d/c is to: SNF              Patient currently is medically stable to d/c.   Difficult to place patient No   Subjective:  No acute events overnight.  Resting comfortably without any complaints.    Objective: Temp:  [97.4 F (36.3 C)-98.2 F (36.8 C)] 97.5 F (36.4 C) (06/03 1934) Pulse Rate:  [81-103] 88 (06/03 1915) Resp:  [12-18] 12 (06/03 1915) BP: (115-161)/(84-114) 135/90 (  06/03 1500) SpO2:  [96 %-100 %] 100 % (06/03 1915) FiO2 (%):  [21 %] 21 % (06/03 1915)  Physical Exam:  General: 65 y.o. female in NA Cardio: RRR no m/r/g Lungs: Tracheostomy in place, minimal secretions.   Pessimer valve in place.  CTAB, no wheezing, no rhonchi, no crackles, no IWOB on room air Abdomen: Soft, non-tender to palpation, non-distended, positive bowel sounds Skin: warm and dry Extremities: No edema  Laboratory: Recent Labs  Lab 08/26/20 1052 08/26/20 1115 08/27/20 1132 08/29/20 0208  WBC 5.7  --  6.1 6.2  HGB 11.4* 11.9* 11.7* 11.3*  HCT 36.9 35.0* 36.9 36.2  PLT 162  --  151 152   Recent Labs  Lab 08/26/20 1052 08/26/20 1115 08/29/20 0645  NA 137 141 140  K 4.5 4.6 4.1  CL 101 102 104  CO2 29  --  26  BUN 16 22 18   CREATININE 0.72 0.50 0.57  CALCIUM 9.6  --  9.5  PROT 6.9  --   --   BILITOT 0.5  --   --   ALKPHOS 61  --   --   ALT 23  --   --   AST 31  --   --   GLUCOSE 201* 194* 101*    Imaging/Diagnostic Tests: No results found.  Carollee Leitz, MD 08/29/2020, 10:44 PM PGY-2, [place] Intern pager: [phone], text pages welcome

## 2020-08-29 NOTE — Progress Notes (Signed)
FPTS Interim Progress Note  Patient alert and resting comfortably. Has Pessimar valve in place. Rounded with primary RN and reports patient continues to try to get out of bed.  Reordered sitter/ telesitter.  Soft bed belt restraint ordered.  Appreciated nightly round.  Today's Vitals   08/28/20 1523 08/28/20 1626 08/28/20 2022 08/28/20 2352  BP: 124/80  (!) 141/86 126/84  Pulse: 86 84 87 88  Resp: 16 16 15 16   Temp: 98.3 F (36.8 C)  97.9 F (36.6 C) (!) 97.5 F (36.4 C)  TempSrc: Axillary  Axillary Axillary  SpO2:  93% 96% 96%  Weight:      PainSc:       Will need follow up in am to revisit restraint needs.  Carollee Leitz, MD Family Medicine Residency

## 2020-08-30 DIAGNOSIS — I69891 Dysphagia following other cerebrovascular disease: Secondary | ICD-10-CM | POA: Diagnosis not present

## 2020-08-30 DIAGNOSIS — S065X9A Traumatic subdural hemorrhage with loss of consciousness of unspecified duration, initial encounter: Secondary | ICD-10-CM | POA: Diagnosis not present

## 2020-08-30 DIAGNOSIS — W108XXA Fall (on) (from) other stairs and steps, initial encounter: Secondary | ICD-10-CM | POA: Diagnosis not present

## 2020-08-30 DIAGNOSIS — R739 Hyperglycemia, unspecified: Secondary | ICD-10-CM | POA: Diagnosis not present

## 2020-08-30 LAB — GLUCOSE, CAPILLARY
Glucose-Capillary: 100 mg/dL — ABNORMAL HIGH (ref 70–99)
Glucose-Capillary: 107 mg/dL — ABNORMAL HIGH (ref 70–99)
Glucose-Capillary: 144 mg/dL — ABNORMAL HIGH (ref 70–99)
Glucose-Capillary: 157 mg/dL — ABNORMAL HIGH (ref 70–99)
Glucose-Capillary: 166 mg/dL — ABNORMAL HIGH (ref 70–99)
Glucose-Capillary: 172 mg/dL — ABNORMAL HIGH (ref 70–99)
Glucose-Capillary: 200 mg/dL — ABNORMAL HIGH (ref 70–99)

## 2020-08-31 DIAGNOSIS — R739 Hyperglycemia, unspecified: Secondary | ICD-10-CM | POA: Diagnosis not present

## 2020-08-31 DIAGNOSIS — Z982 Presence of cerebrospinal fluid drainage device: Secondary | ICD-10-CM | POA: Diagnosis not present

## 2020-08-31 DIAGNOSIS — S065X9A Traumatic subdural hemorrhage with loss of consciousness of unspecified duration, initial encounter: Secondary | ICD-10-CM | POA: Diagnosis not present

## 2020-08-31 DIAGNOSIS — I69891 Dysphagia following other cerebrovascular disease: Secondary | ICD-10-CM | POA: Diagnosis not present

## 2020-08-31 LAB — GLUCOSE, CAPILLARY
Glucose-Capillary: 118 mg/dL — ABNORMAL HIGH (ref 70–99)
Glucose-Capillary: 105 mg/dL — ABNORMAL HIGH (ref 70–99)
Glucose-Capillary: 163 mg/dL — ABNORMAL HIGH (ref 70–99)
Glucose-Capillary: 170 mg/dL — ABNORMAL HIGH (ref 70–99)
Glucose-Capillary: 134 mg/dL — ABNORMAL HIGH (ref 70–99)
Glucose-Capillary: 113 mg/dL — ABNORMAL HIGH (ref 70–99)

## 2020-08-31 LAB — BASIC METABOLIC PANEL
Anion gap: 6 (ref 5–15)
BUN: 20 mg/dL (ref 8–23)
CO2: 30 mmol/L (ref 22–32)
Calcium: 9.6 mg/dL (ref 8.9–10.3)
Chloride: 103 mmol/L (ref 98–111)
Creatinine, Ser: 0.54 mg/dL (ref 0.44–1.00)
GFR, Estimated: >60 mL/min (ref >60)
Glucose, Bld: 110 mg/dL — ABNORMAL HIGH (ref 70–99)
Potassium: 4.3 mmol/L (ref 3.5–5.1)
Sodium: 139 mmol/L (ref 135–145)

## 2020-08-31 LAB — CBC
HCT: 35.2 % — ABNORMAL LOW (ref 36.0–46.0)
Hemoglobin: 10.9 g/dL — ABNORMAL LOW (ref 12.0–15.0)
MCH: 25.5 pg — ABNORMAL LOW (ref 26.0–34.0)
MCHC: 31 g/dL (ref 30.0–36.0)
MCV: 82.4 fL (ref 80.0–100.0)
Platelets: 172 10*3/uL (ref 150–400)
RBC: 4.27 MIL/uL (ref 3.87–5.11)
RDW: 14.6 % (ref 11.5–15.5)
WBC: 6.2 10*3/uL (ref 4.0–10.5)
nRBC: 0 % (ref 0.0–0.2)

## 2020-08-31 NOTE — Progress Notes (Signed)
Family Medicine Teaching Service Daily Progress Note Intern Pager: [phone]  Patient name: Stacy Moran Medical record number: [id number] Date of birth: 11-May-1955 Age: 65 y.o. Gender: female  Primary Care Provider: [name], MD Consultants: None Code Status: Full  Pt Overview and Major Events to Date:  5/31 - admit to FPTS  Assessment and Plan: Stacy Moran a 65 y.o.femalepresenting s/p fall admitted forincreasedweakness and pain since fallthis morning. 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   Agitation  Hx of recent fall due to unsteady gait and weakness No acute events overnight. Has not required PRN Clonazepam since 6/2. Posey belt and 1:1 sitter continues to help keep her safe and in bed. Calm and pleasant this AM  -Continue Clonazepam 0.5mg  qhs prn -Continue Seroquel 50 mg BID -ECG weekly to monitor for QTc -Fall Precautions -Delirium Precautions -PT/OT following, appreciate recommendations -Reorder Telesitter, 1:1 sitter   [place] Palliative care consulted, saw patient 6/3.  Has not been able to reach out to patient's daughter this admission. As of Dec 2021, daughter opted for full code/aggressive care. Capacity fluctuates. Daughter is designated Media planner if patient is unable.  - continue to follow up palliative care recs - Consider outpatient palliative referral at SNF.    Acute on Chronic Subdural Hematoma Hx shunt Stable. Repeat imaging shows no changes to bilateral hematomas from previous. VP Shunt valve adjusted on 06/02 by Neurosurgery -Neurosurgery recs SNF level of care -Monitor for worsening mentation  HTN: Stable. BP 140-150/90's overnight. Only slightly above goal. BMP with stable kidney function and normal electrolytes. -Continue to monitor -Continue Amlodipine 10 mg daily, Hydralazine 50 mg TID, Labetalol 300  mg TID  Normocytic Anemia:  Stable.  Hbg 10.9, MCV normal.  Iron studies wnl -Monitor for bleeding  Hx ofSeizures Stable.  No seizure activity overnight.   -Seizure precautions -Monitor mentation -Continue Keppra 1000 mg BID -EEG if mentation changes  Dysphagia, with PEG-tub  Stable. Bolus tube feeds.  Tolerating well. -Speech following, high risk for aspiration.  Recs HH speech follow up outpatient -NPO -Frequent oral care -Continue Osmolite 1.2 335cc QID with  Free water flush 200 cc q6h.  Trach dependent s/p tracheostomy Stable.  Secretions minimal overnight.  Required 5L O2 overnight. -Continue Robinul 2 mg  BID -Oral/Trach Suction as needed -Monitor for mucus plugs  GERD: Stable. Home medication Reglan 5 mg TID.  Held since admission and no complaints of abdominal pain. -Consider H2 antagonist if any s/s recurrent GERD -Recommend discontinuing Reglan given that long term use increases risk of Tardive Dyskinesia   Hypothyroidism s/p total thyroidectomy Stable. Last TSH 4.39 (03/22) -Continue Synthroid 100 mcg daily  FEN/GI: NPO, PEG tube- Osmolite 1.2 335 cc QID with 200 cc Free Water flush PPx: Heparin subcutaneous  Disposition: awaiting SNF placement  Subjective:  No acute events overnight. Sitting up comfortably in bed watching tv. Denies any concerns.  Objective: Temp:  [97.4 F (36.3 C)-98.9 F (37.2 C)] 98 F (36.7 C) (06/05 0320) Pulse Rate:  [74-90] 74 (06/05 0430) Resp:  [14-18] 16 (06/05 0430) BP: (122-158)/(49-100) 158/92 (06/05 0320) SpO2:  [95 %-100 %] 98 % (06/05 0430) FiO2 (%):  [21 %] 21 % (06/05 0430) Physical Exam: General: pleasant thin older woman, appears older than stated age, sitting comfortably in bed, in no acute distress with non-toxic appearance CV: regular rate and rhythm without murmurs, rubs, or gallops, no lower  extremity edema, 2+ radial and pedal pulses bilaterally Lungs: clear to auscultation bilaterally with  normal work of breathing, Trach in place with overlying oxygen in place, speaking in full sentences Abdomen: soft, non-tender, non-distended Skin: warm, dry Extremities: warm and well perfused  Laboratory: Recent Labs  Lab 08/27/20 1132 08/29/20 0208 08/31/20 0134  WBC 6.1 6.2 6.2  HGB 11.7* 11.3* 10.9*  HCT 36.9 36.2 35.2*  PLT 151 152 172   Recent Labs  Lab 08/26/20 1052 08/26/20 1115 08/29/20 0645 08/31/20 0134  NA 137 141 140 139  K 4.5 4.6 4.1 4.3  CL 101 102 104 103  CO2 29  --  26 30  BUN 16 22 18 20   CREATININE 0.72 0.50 0.57 0.54  CALCIUM 9.6  --  9.5 9.6  PROT 6.9  --   --   --   BILITOT 0.5  --   --   --   ALKPHOS 61  --   --   --   ALT 23  --   --   --   AST 31  --   --   --   GLUCOSE 201* 194* 101* 110*    Imaging/Diagnostic Tests: No results found.  Mina Marble [place], DO 08/31/2020, 5:58 AM PGY-3, [place] Intern pager: [phone], text pages welcome

## 2020-08-31 NOTE — Progress Notes (Signed)
--  Reported by RN that patient tries to get of bed and may fall -- Renewed her restraints order.  Honor Junes, MD PGY-1

## 2020-09-01 DIAGNOSIS — S065X9A Traumatic subdural hemorrhage with loss of consciousness of unspecified duration, initial encounter: Secondary | ICD-10-CM | POA: Diagnosis not present

## 2020-09-01 DIAGNOSIS — R739 Hyperglycemia, unspecified: Secondary | ICD-10-CM | POA: Diagnosis not present

## 2020-09-01 DIAGNOSIS — Z982 Presence of cerebrospinal fluid drainage device: Secondary | ICD-10-CM | POA: Diagnosis not present

## 2020-09-01 DIAGNOSIS — I69891 Dysphagia following other cerebrovascular disease: Secondary | ICD-10-CM | POA: Diagnosis not present

## 2020-09-01 LAB — CBC
HCT: 36.8 % (ref 36.0–46.0)
Hemoglobin: 11.5 g/dL — ABNORMAL LOW (ref 12.0–15.0)
MCH: 25.7 pg — ABNORMAL LOW (ref 26.0–34.0)
MCHC: 31.3 g/dL (ref 30.0–36.0)
MCV: 82.1 fL (ref 80.0–100.0)
Platelets: 194 10*3/uL (ref 150–400)
RBC: 4.48 MIL/uL (ref 3.87–5.11)
RDW: 14.7 % (ref 11.5–15.5)
WBC: 7.3 10*3/uL (ref 4.0–10.5)
nRBC: 0 % (ref 0.0–0.2)

## 2020-09-01 LAB — GLUCOSE, CAPILLARY
Glucose-Capillary: 201 mg/dL — ABNORMAL HIGH (ref 70–99)
Glucose-Capillary: 116 mg/dL — ABNORMAL HIGH (ref 70–99)
Glucose-Capillary: 126 mg/dL — ABNORMAL HIGH (ref 70–99)
Glucose-Capillary: 86 mg/dL (ref 70–99)
Glucose-Capillary: 102 mg/dL — ABNORMAL HIGH (ref 70–99)

## 2020-09-01 LAB — BASIC METABOLIC PANEL
Anion gap: 8 (ref 5–15)
BUN: 23 mg/dL (ref 8–23)
CO2: 27 mmol/L (ref 22–32)
Calcium: 9.9 mg/dL (ref 8.9–10.3)
Chloride: 103 mmol/L (ref 98–111)
Creatinine, Ser: 0.57 mg/dL (ref 0.44–1.00)
GFR, Estimated: >60 mL/min (ref >60)
Glucose, Bld: 110 mg/dL — ABNORMAL HIGH (ref 70–99)
Potassium: 4.3 mmol/L (ref 3.5–5.1)
Sodium: 138 mmol/L (ref 135–145)

## 2020-09-01 NOTE — TOC Progression Note (Signed)
Transition of Care [facility]) - Progression Note    Patient Details  Name: Stacy Moran MRN: [id number] Date of Birth: [birth year]  Transition of Care [facility] CM/SW [place], [place] Phone Number: 09/01/2020, 9:13 AM  Clinical Narrative:   CSW following for discharge to SNF. Patient has no bed offers at this time. CSW to continue bed search.    Expected Discharge Plan: [place] Barriers to Discharge: Continued Medical Work up  Expected Discharge Plan and Services Expected Discharge Plan: [place] In-house Referral: Clinical Social Work Discharge Planning Services: CM Consult Post Acute Care Choice: [place] arrangements for the past 2 months: Single Family Home                                       Social Determinants of Health (SDOH) Interventions    Readmission Risk Interventions No flowsheet data found.

## 2020-09-01 NOTE — Progress Notes (Addendum)
Family Medicine Teaching Service Daily Progress Note Intern Pager: [phone]  Patient name: Stacy Moran Medical record number: [id number] Date of birth: 1955-10-04 Age: 65 y.o. Gender: female  Primary Care Provider: [name], MD Consultants: None Code Status: Full  Pt Overview and Major Events to Date:  5/31 - Admitted to FPTS  Assessment and Plan: Stacy Moran a 65 y.o.femalepresenting s/p fall admitted forincreasedweakness and pain since fallthis morning. 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 Medically stable for discharge to SNF.  Agitation  Hx of recent fall due to unsteady gait and weakness Has not required PRN Clonazepam since 6/2. Posey belt and 1:1 sitter continues to help keep her safe and in bed.  -Continue Clonazepam 0.5mg  qhs prn -Continue Seroquel 50 mg BID -ECG weekly to monitor for QTc -Fall Precautions -Delirium Precautions -PT/OT following, appreciate recommendations -Reorder Telesitter, 1:1 sitter   [place] Capacity fluctuates. Daughter is designated Media planner if patient is unable.  - continue to follow up palliative care recs - Consider outpatient palliative referral at SNF.   - Will reach out to social work regarding bed status  Acute on Chronic Subdural Hematoma Hx shunt. Stable.  -Neurosurgery recs SNF level of care -Monitor for worsening mentation  HTN: Stable. BP 107-154/69-93 overnight. -Continue to monitor -Continue Amlodipine 10 mg daily, Hydralazine 50 mg TID, Labetalol 300 mg TID -BMP every Monday  Normocytic Anemia:  Stable.  Hbg 10.9 on 6/5, normal iron studies.  -Monitor for bleeding -CBC every Monday  Hx ofSeizures Stable.  No seizure activity overnight.   -Seizure precautions -Monitor mentation -Continue Keppra 1000 mg BID -EEG if mentation changes  Dysphagia, with PEG-tub   Stable. Bolus tube feeds.  Tolerating well. -Speech following, high risk for aspiration.  HH speech follow up outpatient -NPO -Frequent oral care -Continue Osmolite 1.2 335cc QID with  Free water flush 200 cc q6h.  Trach dependent s/p tracheostomy Stable.  Secretions minimal overnight.  Continues to be on 5L O2. -Continue Robinul 2 mg  BID -Oral/Trach Suction as needed -Monitor for mucus plugs  GERD: Stable. Home medication Reglan 5 mg TID.  Held since admission and no complaints of abdominal pain. -Consider H2 antagonist if any s/s recurrent GERD -Recommend discontinuing Reglan given that long term use increases risk of Tardive Dyskinesia   Hypothyroidism s/p total thyroidectomy Stable. Last TSH 4.39 (03/22) -Continue Synthroid 100 mcg daily  FEN/GI: NPO, PEG tube- Osmolite 1.2 335 cc QID with 200 cc Free Water flush PPx: Heparin subcutaneous   Status is: Inpatient  Remains inpatient appropriate because:Unsafe d/c plan   Dispo: The patient is from: Home              Anticipated d/c is to: SNF              Patient currently is medically stable to d/c.   Difficult to place patient No   Subjective:  Patient reports that she is feeling pretty good this morning, she does not have any pain anywhere.  Does report that she wants to go home to go visit her daughter.  Objective: Temp:  [98 F (36.7 C)-99.4 F (37.4 C)] 98 F (36.7 C) (06/06 0309) Pulse Rate:  [66-97] 71 (06/06 0418) Resp:  [13-17] 16 (06/06 0418) BP: (107-154)/(69-93) 107/69 (06/06 0418) SpO2:  [96 %-100 %] 98 % (06/06 0418) FiO2 (%):  [21 %] 21 % (06/06 0418) Weight:  [  58 kg] 58 kg (06/06 0309) Physical Exam: General: NAD, sitting up in bed, Posey belt in place Cardiovascular: RRR, no murmur appreciated Respiratory: Breathing comfortably with 5 L via tracheostomy collar, no increased work of breathing, loud upper airway sounds Abdomen: Soft, nontender, nondistended Extremities: Able to move all  extremities equally and appropriately, mitts noted on bilateral hands  Laboratory: Recent Labs  Lab 08/27/20 1132 08/29/20 0208 08/31/20 0134  WBC 6.1 6.2 6.2  HGB 11.7* 11.3* 10.9*  HCT 36.9 36.2 35.2*  PLT 151 152 172   Recent Labs  Lab 08/26/20 1052 08/26/20 1115 08/29/20 0645 08/31/20 0134  NA 137 141 140 139  K 4.5 4.6 4.1 4.3  CL 101 102 104 103  CO2 29  --  26 30  BUN 16 22 18 20   CREATININE 0.72 0.50 0.57 0.54  CALCIUM 9.6  --  9.5 9.6  PROT 6.9  --   --   --   BILITOT 0.5  --   --   --   ALKPHOS 61  --   --   --   ALT 23  --   --   --   AST 31  --   --   --   GLUCOSE 201* 194* 101* 110*    Imaging/Diagnostic Tests: No results found.   Rise Patience, DO 09/01/2020, 5:21 AM PGY-1, [place] Intern pager: [phone], text pages welcome

## 2020-09-01 NOTE — Progress Notes (Signed)
FPTS Interim Progress Note  Patient sleeping and resting comfortably.  Rounded with primary RN, continues to try to get out of bed when awake. Bed belt restraint and mitts remain on. No concerns voiced.  Appreciated nightly round.  Today's Vitals   08/31/20 1931 08/31/20 1947 08/31/20 2010 08/31/20 2321  BP:  (!) 148/81 (!) 148/81 126/80  Pulse: 66 76 75 70  Resp: 17 13 15 14   Temp:  98 F (36.7 C)  98.3 F (36.8 C)  TempSrc:  Oral  Oral  SpO2:  99% 98% 96%  Weight:      PainSc:        Reordered soft restraints Reevaluate in am  Carollee Leitz, MD Family Medicine Residency

## 2020-09-01 NOTE — Progress Notes (Signed)
Physical Therapy Treatment Patient Details Name: Stacy Moran MRN: [id number] DOB: 07-15-1955 Today's Date: 09/01/2020    History of Present Illness 65 y/o female presented to ED on 5/31 with generalized waekness and L arm pain/weakness s/p fall down the stairs. 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 Radiographs negative for acute fxs. PMH: SAH s/p PICA aneurysm clipping, bilateral SDH, seizures, chronic tracheostomy, PEG, seizures, HTN, GERD, anxiety    PT Comments    Pt received in bed. Min guard assist bed mobility, min assist transfers, and min/HHA ambulation 5'. Pt in recliner at end of session, feet elevated and posey belt/hand mitts in place.    Follow Up Recommendations  SNF;Supervision/Assistance - 24 hour     Equipment Recommendations  None recommended by PT    Recommendations for Other Services       Precautions / Restrictions Precautions Precautions: Fall;Other (comment) Precaution Comments: trach, PEG    Mobility  Bed Mobility Overal bed mobility: Needs Assistance Bed Mobility: Supine to Sit     Supine to sit: Min guard;HOB elevated     General bed mobility comments: min guard for safety    Transfers Overall transfer level: Needs assistance Equipment used: Rolling walker (2 wheeled);1 person hand held assist Transfers: Sit to/from Stand Sit to Stand: Min assist Stand pivot transfers: Min assist       General transfer comment: cues for sequencing, increased time to power up. sit to stand x 3 trials with/without RW  Ambulation/Gait Ambulation/Gait assistance: Min assist Gait Distance (Feet): 5 Feet Assistive device: 1 person hand held assist Gait Pattern/deviations: Step-through pattern;Decreased stride length Gait velocity: decreased   General Gait Details: improved stability with gait as compared to previous session, pt ambulated with BUE support on therapist's  forearms   Stairs             Wheelchair Mobility    Modified Rankin (Stroke Patients Only)       Balance Overall balance assessment: Needs assistance Sitting-balance support: Feet supported;No upper extremity supported Sitting balance-Leahy Scale: Fair     Standing balance support: Bilateral upper extremity supported;During functional activity Standing balance-Leahy Scale: Poor Standing balance comment: reliant on external support                            Cognition Arousal/Alertness: Awake/alert Behavior During Therapy: Flat affect Overall Cognitive Status: History of cognitive impairments - at baseline                                 General Comments: following commands with increased time. Minimal verbalizations, primarily nodding head yes/no.      Exercises      General Comments General comments (skin integrity, edema, etc.): 5L 28% FiO2 via trach collar. PMV in place. VSS      Pertinent Vitals/Pain Pain Assessment: Faces Faces Pain Scale: No hurt    Home Living                      Prior Function            PT Goals (current goals can now be found in the care plan section) Acute Rehab PT Goals Patient Stated Goal: not stated Progress towards PT goals: Progressing toward goals    Frequency    Min 3X/week  PT Plan Current plan remains appropriate    Co-evaluation              AM-PAC PT "6 Clicks" Mobility   Outcome Measure  Help needed turning from your back to your side while in a flat bed without using bedrails?: A Little Help needed moving from lying on your back to sitting on the side of a flat bed without using bedrails?: A Little Help needed moving to and from a bed to a chair (including a wheelchair)?: A Little Help needed standing up from a chair using your arms (e.g., wheelchair or bedside chair)?: A Little Help needed to walk in hospital room?: A Little Help needed climbing 3-5  steps with a railing? : A Lot 6 Click Score: 17    End of Session Equipment Utilized During Treatment: Gait belt;Oxygen Activity Tolerance: Patient tolerated treatment well Patient left: in chair;with call bell/phone within reach;with chair alarm set;with restraints reapplied Nurse Communication: Mobility status PT Visit Diagnosis: Unsteadiness on feet (R26.81);Muscle weakness (generalized) (M62.81);Other abnormalities of gait and mobility (R26.89);Other symptoms and signs involving the nervous system (R29.898);Ataxic gait (R26.0)     Time: 0375-4360 PT Time Calculation (min) (ACUTE ONLY): 30 min  Charges:  $Gait Training: 8-22 mins $Therapeutic Activity: 8-22 mins                     Lorrin Goodell, PT  Office # [phone] Pager [phone]    [name] 09/01/2020, 8:54 AM

## 2020-09-01 NOTE — Plan of Care (Signed)
  Problem: Safety: Goal: Non-violent Restraint(s) Outcome: Progressing   Problem: Safety: Goal: Non-violent Restraint(s) Outcome: Progressing   Problem: Education: Goal: Knowledge of General Education information will improve Description: Including pain rating scale, medication(s)/side effects and non-pharmacologic comfort measures Outcome: Progressing   Problem: Health Behavior/Discharge Planning: Goal: Ability to manage health-related needs will improve Outcome: Progressing   Problem: Clinical Measurements: Goal: Ability to maintain clinical measurements within normal limits will improve Outcome: Progressing Goal: Will remain free from infection Outcome: Progressing Goal: Diagnostic test results will improve Outcome: Progressing Goal: Respiratory complications will improve Outcome: Progressing Goal: Cardiovascular complication will be avoided Outcome: Progressing   Problem: Activity: Goal: Risk for activity intolerance will decrease Outcome: Progressing   Problem: Nutrition: Goal: Adequate nutrition will be maintained Outcome: Progressing   Problem: Elimination: Goal: Will not experience complications related to bowel motility Outcome: Progressing Goal: Will not experience complications related to urinary retention Outcome: Progressing   Problem: Pain Managment: Goal: General experience of comfort will improve Outcome: Progressing   Problem: Safety: Goal: Ability to remain free from injury will improve Outcome: Progressing   Problem: Skin Integrity: Goal: Risk for impaired skin integrity will decrease Outcome: Progressing

## 2020-09-02 ENCOUNTER — Other Ambulatory Visit: Payer: Self-pay

## 2020-09-02 LAB — GLUCOSE, CAPILLARY
Glucose-Capillary: 104 mg/dL — ABNORMAL HIGH (ref 70–99)
Glucose-Capillary: 114 mg/dL — ABNORMAL HIGH (ref 70–99)
Glucose-Capillary: 125 mg/dL — ABNORMAL HIGH (ref 70–99)
Glucose-Capillary: 142 mg/dL — ABNORMAL HIGH (ref 70–99)
Glucose-Capillary: 154 mg/dL — ABNORMAL HIGH (ref 70–99)
Glucose-Capillary: 178 mg/dL — ABNORMAL HIGH (ref 70–99)
Glucose-Capillary: 184 mg/dL — ABNORMAL HIGH (ref 70–99)

## 2020-09-02 MED ORDER — POLYETHYLENE GLYCOL 3350 17 G PO PACK
17.0000 g | PACK | Freq: Every day | ORAL | Status: DC
  Administered 2020-09-02 – 2020-09-03 (×2): 17 g
  Filled 2020-09-02: qty 1

## 2020-09-02 MED ORDER — LEVETIRACETAM 100 MG/ML PO SOLN
750.0000 mg | Freq: Two times a day (BID) | ORAL | Status: DC
  Administered 2020-09-02 – 2020-10-07 (×71): 750 mg
  Filled 2020-09-02 (×72): qty 10

## 2020-09-02 MED ORDER — POLYETHYLENE GLYCOL 3350 17 G PO PACK
17.0000 g | PACK | Freq: Every day | ORAL | Status: DC
  Filled 2020-09-02: qty 1

## 2020-09-02 NOTE — Progress Notes (Addendum)
Daily Progress Note   Patient Name: Stacy Moran       Date: 09/02/2020 DOB: October 26, 1955  Age: 65 y.o. MRN#: [id number] Attending Physician: Leeanne Rio, MD Primary Care Physician: [name], MD Admit Date: 08/26/2020  Reason for Consultation/Follow-up: Establishing goals of care  Subjective: Patient sitting in bed, sad- does not want mitts on. Worries that nurses think she is going to hurt someone. She is not able to participate in [place] discussion.  Met later with daughter [name].  [name] recognizes that her Mom is not at the functional level that she was prior to this most recent fall. [name] does not feel that her Mom can be safely managed at home and is hopeful for SNF placement. We discussed her chronic illnesses and comorbidities.  GOC were discussed in the context of considering patient's values.  Code status was discussed and MOST form completed.  We discussed patient's poor functional status and her risk for ongoing aspiration, recurrent infections, worsening of her vascular dementia.  Following selections were made:  I completed a MOST form today. The patient and family outlined their wishes for the following treatment decisions:  Cardiopulmonary Resuscitation: Do Not Attempt Resuscitation (DNR/No CPR)  Medical Interventions: Full Scope of Treatment: Use intubation, advanced airway interventions, mechanical ventilation, cardioversion as indicated, medical treatment, IV fluids, etc, also provide comfort measures. Transfer to the hospital if indicated  Antibiotics: Antibiotics if indicated  IV Fluids: IV fluids if indicated  Feeding Tube: Feeding tube long-term if indicated   We discussed the need to continue to evaluate patient's quality of life and goals of care, and  situations where the above selections might change.    Review of Systems  Unable to perform ROS: Dementia    Length of Stay: 5  Current Medications: Scheduled Meds:  . amLODipine  10 mg Per Tube Daily  . chlorhexidine  15 mL Mouth Rinse BID  . feeding supplement (OSMOLITE 1.2 CAL)  355 mL Per Tube TID WC & HS  . feeding supplement (PROSource TF)  45 mL Per Tube BID  . free water  200 mL Per Tube Q6H  . glycopyrrolate  2 mg Per Tube BID  . heparin  5,000 Units Subcutaneous Q8H  . hydrALAZINE  50 mg Per Tube Q8H  . labetalol  300 mg Per Tube  TID  . levETIRAcetam  750 mg Per Tube BID  . levothyroxine  100 mcg Per Tube Q0600  . mouth rinse  15 mL Mouth Rinse q12n4p  . polyethylene glycol  17 g Oral Daily  . QUEtiapine  50 mg Per Tube BID  . sodium chloride flush  3 mL Intravenous Once    Continuous Infusions:   PRN Meds: acetaminophen, clonazePAM  Physical Exam Vitals and nursing note reviewed.  Constitutional:      Appearance: She is ill-appearing.     Comments: frail  Neurological:     Mental Status: She is alert. She is disoriented.             Vital Signs: BP (!) 146/87   Pulse 83   Temp 98.5 F (36.9 C) (Oral)   Resp 16   Wt 56.6 kg   SpO2 99%   BMI 20.14 kg/m  SpO2: SpO2: 99 % O2 Device: O2 Device: Tracheostomy Collar O2 Flow Rate: O2 Flow Rate (L/min): 5 L/min  Intake/output summary:   Intake/Output Summary (Last 24 hours) at 09/02/2020 1451 Last data filed at 09/02/2020 0534 Gross per 24 hour  Intake 100 ml  Output 500 ml  Net -400 ml   LBM: Last BM Date:  (PTA) Baseline Weight: Weight: 55.5 kg Most recent weight: Weight: 56.6 kg       Palliative Assessment/Data: PPS: 20%    Flowsheet Rows   Flowsheet Row Most Recent Value  Intake Tab   Referral Department --  [FMTS]  Unit at Time of Referral Cardiac/Telemetry Unit  Palliative Care Primary Diagnosis Neurology  Date Notified 08/28/20  Palliative Care Type Return patient Palliative Care   Reason for referral Clarify Goals of Care  Date of Admission 08/26/20  Date first seen by Palliative Care 08/29/20  # of days Palliative referral response time 1 Day(s)  # of days IP prior to Palliative referral 2  Clinical Assessment   Psychosocial & Spiritual Assessment   Palliative Care Outcomes       Patient Active Problem List   Diagnosis Date Noted  . Fall (on) (from) other stairs and steps, initial encounter 08/26/2020  . Subdural hematoma ([place]) 07/31/2020  . History of hemorrhagic cerebrovascular accident (CVA) with residual deficit 06/06/2020  . 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 S/P ventriculoperitoneal shunt 06/06/2020  . Functional urinary incontinence 06/06/2020  . Ineffective airway clearance   . Thrombocytopenia ([place])   . Restlessness   . Restlessness and agitation   . Acute blood loss anemia   . Essential hypertension   . Seizure prophylaxis   . Protein-calorie malnutrition, severe 04/24/2020  . Dysphagia, post-stroke   . Benign essential HTN   . Dysphagia   . Status post tracheostomy ([place])   . S/P percutaneous endoscopic gastrostomy (PEG) tube placement ([place])   . ICH (intracerebral hemorrhage) ([place]) 04/22/2020  . Obstructive hydrocephalus ([place])   . Seizure-like activity ([place]) 03/12/2020  . PEG (percutaneous endoscopic gastrostomy) status ([place]) 03/08/2020  . Hyperglycemia 03/08/2020  . Acute respiratory failure ([place])   . Ventilator dependence ([place])   . 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 Tracheostomy status ([place]) 02/19/2020  . Abdominal distention   . 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 SAH (subarachnoid hemorrhage) ([place])   . Subarachnoid bleed ([place])   . Tachypnea   . Prediabetes   . Hypokalemia   . Leukocytosis   . Ileus, postoperative ([place])   . Pressure injury  of skin 02/13/2020  . Ruptured cerebral aneurysm [facility]) 01/30/2020    Palliative Care Assessment & Plan    Patient Profile: 65 y.o. 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  Diagnosed with acute on chronic subdural hematoma. PMT consulted for assistance with symptom management. Received call from patient's daughter requesting goals of care discussion.   Assessment/Recommendations/Plan  DNR- full scope treatment otherwise MOST form completed  Goals of Care and Additional Recommendations: Limitations on Scope of Treatment: Full Scope Treatment  Code Status: DNR  Prognosis:  Unable to determine  Discharge Planning: [place] for rehab with Palliative care service follow-up  Care plan was discussed with patient's daughter- [name].  Thank you for allowing the Palliative Medicine Team to assist in the care of this patient.   Total time: 71 minutes Greater than 50%  of this time was spent counseling and coordinating care related to the above assessment and plan.  Mariana Kaufman, AGNP-C Palliative Medicine   Please contact Palliative Medicine Team phone at [phone] for questions and concerns.

## 2020-09-02 NOTE — Progress Notes (Signed)
  Speech Language Pathology Treatment: Dysphagia  Patient Details Name: Stacy Moran MRN: [id number] DOB: 1955-06-21 Today's Date: 09/02/2020 Time: 9147-8295 SLP Time Calculation (min) (ACUTE ONLY): 15 min  Assessment / Plan / Recommendation Clinical Impression  Pt is alert this morning but still presenting with signs of pharyngeal dysphagia. PMV was donned with dysphonia noted. Oral holding of secretions was observed and cleared prior to donning valve, with additional secretions expectorated once in place. Suspect pooling of secretions pharyngeally as well. Pt was given ice chips with cues for effortful swallows, with additional effortful swallows performed to increase utilization of swallowing musculature. Minimal hyolaryngeal movement was noted to palpation, and she had persistent coughing that was intermittently productive. SLP provided cueing for pt to utilize yankauer to try to manage these secretions with increased independence. Recommend that she remain NPO for now.    HPI HPI: Pt is a 65 year old woman with who presented with acute worsening of subdural hematomas after fall. MRI brain 5/31: "Unchanged bilateral subdural hematomas and trace rightward midline shift." Neurosurgery considering increasing the setting on her shunt as of 5/31. Pt d/c from CIR NPO with PEG has not comlpeted any SLP therapy or swallow evaluations since 05/20/20. At that time pt still not tolearting PMSV well. 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  MBS 02/19/20 profound dysphagia with abnormal anatomy. Repeat MBS 02/26/21 minimal improvement- decr edema however significant subglottic edema/stenosis narrowing subglottic space. Bolus stops at UES with minimal opening with aspiration during/post swalow- continue NPO. MBS 04/09/20: severe pharyngoesophageal dysphagia with minimal  improvements. subglottic edema was decreased but UES function continued to be significantly impaired in addition to tongue base retraction and pharyngeal contraction all contributing to pharyngeal stasis. Pt was also seen ENT. On 02/29/20 Dr Constance Holster performed laryngoscopy reporting "The cords were thickened and swollen.  There is significant subglottic swelling especially on the left side and posteriorly.  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  As the anesthesia was wearing off the cords were inspected.  Deep suctioning through the tracheostomy was used to elicit a cough.  There definitely seem to be movement of the right cord.  The left cord may be paretic but it is a little hard to tell... Over the next several months we can continue evaluation to see how the subglottic airway grasses.  She may need future procedures to open the subglottis if necessary"      SLP Plan  Continue with current plan of care       Recommendations  Diet recommendations: NPO Medication Administration: Via alternative means                Oral Care Recommendations: Oral care QID;Staff/trained caregiver to provide oral care Follow up Recommendations: Skilled Nursing facility SLP Visit Diagnosis: Dysphagia, unspecified (R13.10) Plan: Continue with current plan of care       GO                Osie Bond., M.A. [place] Acute Rehabilitation Services Pager [phone] Office [phone]  09/02/2020, 9:07 AM

## 2020-09-02 NOTE — Progress Notes (Signed)
Bladder scan performed this evening per orders, scan shows 471 mL. MD paged, orders given to perform in and out cath, 600 mL of clear, yellow urine obtained. See flowsheets for additional details.

## 2020-09-02 NOTE — Progress Notes (Signed)
FPTS Interim Progress Note  Patient sleeping and resting comfortably.  Rounded with primary RN.  No concerns voiced.  No orders required.  Appreciated nightly round.  Today's Vitals   09/01/20 1521 09/01/20 1617 09/01/20 1937 09/01/20 2309  BP:  130/86 (!) 161/66 115/68  Pulse: 75 78 87 74  Resp: 15 13 17 13   Temp:  98.1 F (36.7 C)  98.2 F (36.8 C)  TempSrc:  Oral  Oral  SpO2: 96% 97% 99% 99%  Weight:      PainSc:  0-No pain      Carollee Leitz, MD 09/02/2020, 3:30 AM PGY-2, [facility] Medicine Service pager [phone]

## 2020-09-02 NOTE — Progress Notes (Signed)
Physical Therapy Treatment Patient Details Name: Stacy Moran MRN: [id number] DOB: 1955/06/29 Today's Date: 09/02/2020    History of Present Illness 65 y/o female presented to ED on 5/31 with generalized waekness and L arm pain/weakness s/p fall down the stairs. 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 Radiographs negative for acute fxs. PMH: SAH s/p PICA aneurysm clipping, bilateral SDH, seizures, chronic tracheostomy, PEG, seizures, HTN, GERD, anxiety    PT Comments    Patient received in bed, alert, smiling. Patient attempting to speak but it is difficult to understand her. She is agreeable to PT session. Patient requires min guard for bed mobility. Had posey belt restraint on in bed. Multimodal cues at times for mobility. Does not consistently follow direction. Patient requires min +2 for sit to stand and min +2 for ambulation in room for safety. She requires assistance to navigate walker, and to assist with direction at times. Patient will continue to benefit from skilled PT while here to improve functional mobility and independence.     Follow Up Recommendations  SNF;Supervision/Assistance - 24 hour     Equipment Recommendations  None recommended by PT    Recommendations for Other Services       Precautions / Restrictions Precautions Precautions: Fall Precaution Comments: trach, PEG, posey belt Restrictions Weight Bearing Restrictions: No    Mobility  Bed Mobility Overal bed mobility: Needs Assistance Bed Mobility: Supine to Sit     Supine to sit: Min guard;HOB elevated     General bed mobility comments: min guard for safety    Transfers Overall transfer level: Needs assistance Equipment used: Rolling walker (2 wheeled) Transfers: Sit to/from Stand Sit to Stand: Min assist         General transfer comment: Cues for use of AD, safety, hand placement with  transfers  Ambulation/Gait Ambulation/Gait assistance: Min assist;+2 safety/equipment Gait Distance (Feet): 40 Feet Assistive device: Rolling walker (2 wheeled) Gait Pattern/deviations: Step-through pattern;Decreased step length - right;Decreased step length - left;Decreased stride length Gait velocity: decreased   General Gait Details: Patient ambulated with RW and +2 min assist. Patient with decreased safety awareness, decreased command following at times. Requires multimodal cues for mobility ( turning, backing up, etc) Min assist to manuever RW at times.   Stairs             Wheelchair Mobility    Modified Rankin (Stroke Patients Only)       Balance Overall balance assessment: Needs assistance Sitting-balance support: Feet supported Sitting balance-Leahy Scale: Good     Standing balance support: Bilateral upper extremity supported;During functional activity Standing balance-Leahy Scale: Poor Standing balance comment: reliant on external support and +2 min assist for safety                            Cognition Arousal/Alertness: Awake/alert Behavior During Therapy: Impulsive Overall Cognitive Status: History of cognitive impairments - at baseline                                 General Comments: pt with impaired safey awareness and noted to be impulsive with mobility, pt attempting to verbalize during session however difficult to interpret as pts speech garbled and difficult to understand      Exercises Other Exercises Other Exercises: B LE: LAQ, marching, hip abd/add x 5-10 reps. Cues for  pacing.    General Comments        Pertinent Vitals/Pain Pain Assessment: Faces Faces Pain Scale: No hurt    Home Living                      Prior Function            PT Goals (current goals can now be found in the care plan section) Acute Rehab PT Goals Patient Stated Goal: not stated PT Goal Formulation: Patient unable to  participate in goal setting Time For Goal Achievement: 09/10/20 Progress towards PT goals: Progressing toward goals    Frequency    Min 3X/week      PT Plan Current plan remains appropriate    Co-evaluation PT/OT/SLP Co-Evaluation/Treatment: Yes Reason for Co-Treatment: For patient/therapist safety;To address functional/ADL transfers;Necessary to address cognition/behavior during functional activity PT goals addressed during session: Mobility/safety with mobility;Balance;Proper use of DME        AM-PAC PT "6 Clicks" Mobility   Outcome Measure  Help needed turning from your back to your side while in a flat bed without using bedrails?: A Little Help needed moving from lying on your back to sitting on the side of a flat bed without using bedrails?: A Little Help needed moving to and from a bed to a chair (including a wheelchair)?: A Little Help needed standing up from a chair using your arms (e.g., wheelchair or bedside chair)?: A Little Help needed to walk in hospital room?: A Lot Help needed climbing 3-5 steps with a railing? : A Lot 6 Click Score: 16    End of Session Equipment Utilized During Treatment: Gait belt;Oxygen Activity Tolerance: Patient tolerated treatment well Patient left: in chair;with restraints reapplied;with chair alarm set;with call bell/phone within reach Nurse Communication: Mobility status PT Visit Diagnosis: Other abnormalities of gait and mobility (R26.89);Unsteadiness on feet (R26.81);Difficulty in walking, not elsewhere classified (R26.2);Other symptoms and signs involving the nervous system (R29.898)     Time: 1113-1140 PT Time Calculation (min) (ACUTE ONLY): 27 min  Charges:  $Gait Training: 8-22 mins                     [employer], PT, GCS 09/02/20,12:36 PM

## 2020-09-02 NOTE — Plan of Care (Signed)
  Problem: Safety: Goal: Non-violent Restraint(s) Outcome: Progressing   Problem: Safety: Goal: Non-violent Restraint(s) Outcome: Progressing   Problem: Education: Goal: Knowledge of General Education information will improve Description: Including pain rating scale, medication(s)/side effects and non-pharmacologic comfort measures Outcome: Progressing   Problem: Health Behavior/Discharge Planning: Goal: Ability to manage health-related needs will improve Outcome: Progressing   Problem: Clinical Measurements: Goal: Ability to maintain clinical measurements within normal limits will improve Outcome: Progressing Goal: Will remain free from infection Outcome: Progressing Goal: Diagnostic test results will improve Outcome: Progressing Goal: Respiratory complications will improve Outcome: Progressing Goal: Cardiovascular complication will be avoided Outcome: Progressing   Problem: Activity: Goal: Risk for activity intolerance will decrease Outcome: Progressing   Problem: Nutrition: Goal: Adequate nutrition will be maintained Outcome: Progressing   Problem: Elimination: Goal: Will not experience complications related to bowel motility Outcome: Progressing Goal: Will not experience complications related to urinary retention Outcome: Progressing   Problem: Coping: Goal: Level of anxiety will decrease Outcome: Progressing   Problem: Pain Managment: Goal: General experience of comfort will improve Outcome: Progressing

## 2020-09-02 NOTE — Progress Notes (Addendum)
Family Medicine Teaching Service Daily Progress Note Intern Pager: [phone]  Patient name: Stacy Moran Medical record number: [id number] Date of birth: 10/02/1955 Age: 65 y.o. Gender: female  Primary Care Provider: [name], MD Consultants: None Code Status: Full  Pt Overview and Major Events to Date:  5/31 - Admitted Medically stable for discharge to SNF  Assessment and Plan: Stacy Moran is a 65 y.o. female presenting s/p fall admitted for increased weakness and pain since fall this morning. 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 Medically stable for discharge to SNF.  Recent fall 2/2 unsteady gait and weakness Posey belt and mitts in place.  - Clonazepam 0.5mg  QHS PRN  - Seroquel 50mg  BID - Weekly ECG (monitor QTc) - Fall and delirium precautions - PT/OT following - 1:1 sitter, posey belt, and mitts as needed for patient safety  GOC Fluctuating capacity, daughter designated decision maker if patient not capable. Palliative had been unable to contact daughter regarding [place] discussion this admission. - SW following for SNF placement  Hypertension BP in the last 24 hours 109-161/66-94. -Continue home amlodipine 10 mg daily, hydralazine 50 mg 3 times daily, labetalol 300mg  daily  History of seizures - Continue Keppra 1000 mg daily  Dysphagia with PEG tube -Speech following, high risk for aspiration -Frequent oral care -Continue Osmolite with free water flush  Trach dependent s/p tracheostomy Continues to be on 5 L O2 -Continue Robinul 2 mg twice daily -Oral/trach suction as needed  Hypothyroidism s/p total thyroidectomy Continue Synthroid 100 mcg daily    FEN/GI: NPO, PEG tube- Osmolite 1.2 335 cc QID with 200 cc Free Water flush PPx: Heparin subcutaneous   Status is: Inpatient  Remains inpatient appropriate because:Unsafe d/c  plan   Dispo: The patient is from: Home              Anticipated d/c is to: SNF              Patient currently is medically stable to d/c.   Difficult to place patient No    Subjective:  Patient feels good today, no concerns and reports no pain.   Objective: Temp:  [98.1 F (36.7 C)-98.5 F (36.9 C)] 98.5 F (36.9 C) (06/07 0758) Pulse Rate:  [72-87] 74 (06/07 0758) Resp:  [11-17] 12 (06/07 0758) BP: (115-161)/(66-89) 149/89 (06/07 0758) SpO2:  [96 %-100 %] 98 % (06/07 0758) FiO2 (%):  [21 %-28 %] 21 % (06/07 0758) Weight:  [56.6 kg] 56.6 kg (06/07 0500) Physical Exam: General: NAD, supine in bed, posey belt in place Cardiovascular: RRR, difficult to auscultate over trach and expiratory sounds Respiratory: breathing comfortably with tracheostomy collar at 5L, loud expiratory sounds, no increased WOB Abdomen: soft, non-tender, non-distended. Somewhat difficult to palpate given posey belt in place GU: foley catheter in place Extremities: moving all equally and appropriately  Laboratory: Recent Labs  Lab 08/29/20 0208 08/31/20 0134 09/01/20 0942  WBC 6.2 6.2 7.3  HGB 11.3* 10.9* 11.5*  HCT 36.2 35.2* 36.8  PLT 152 172 194   Recent Labs  Lab 08/26/20 1052 08/26/20 1115 08/29/20 0645 08/31/20 0134 09/01/20 0942  NA 137   < > 140 139 138  K 4.5   < > 4.1 4.3 4.3  CL 101   < > 104 103 103  CO2 29  --  26 30 27   BUN 16   < > 18  20 23  CREATININE 0.72   < > 0.57 0.54 0.57  CALCIUM 9.6  --  9.5 9.6 9.9  PROT 6.9  --   --   --   --   BILITOT 0.5  --   --   --   --   ALKPHOS 61  --   --   --   --   ALT 23  --   --   --   --   AST 31  --   --   --   --   GLUCOSE 201*   < > 101* 110* 110*   < > = values in this interval not displayed.     Imaging/Diagnostic Tests: No results found.   Rise Patience, DO 09/02/2020, 8:40 AM PGY-1, [place] Intern pager: [phone], text pages welcome

## 2020-09-02 NOTE — Progress Notes (Signed)
IVT consulted for PIV.  PIV placement currently not warranted.  Spoke with RN, Lenna Sciara.  Will put in new consult if a piv becomes necessary.

## 2020-09-02 NOTE — Progress Notes (Signed)
Occupational Therapy Treatment Patient Details Name: Stacy Moran MRN: [id number] DOB: January 08, 1956 Today's Date: 09/02/2020    History of present illness 65 y/o female presented to ED on 5/31 with generalized waekness and L arm pain/weakness s/p fall down the stairs. 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 Radiographs negative for acute fxs. PMH: SAH s/p PICA aneurysm clipping, bilateral SDH, seizures, chronic tracheostomy, PEG, seizures, HTN, GERD, anxiety   OT comments  Pt making steady progress towards OT goals this session. Pt seen in conjunction with PT to maximize pts activity tolerance. Pt continues to present with impaired safety awareness, impulsivity and impaired balance needing +2 assist for safety and line mgmt. Pt currently requires MIN A +2 for functional mobility with RW and toilet transfers to regular toilet. Pt on 6LPM on venturi hook up for trach collar during mobility, abel to return pt back down to 5LPM 28% on trach collar at end of session. Pt attempting to verbalize during session however pt difficult to understand d/t garbled speech ( alerted Rn that pt may need to suctioned).  Pt would continue to benefit from skilled occupational therapy while admitted and after d/c to address the below listed limitations in order to improve overall functional mobility and facilitate independence with BADL participation. DC plan remains appropriate, will follow acutely per POC.     Follow Up Recommendations  SNF    Equipment Recommendations  None recommended by OT    Recommendations for Other Services      Precautions / Restrictions Precautions Precautions: Fall Precaution Comments: trach, PEG, posey belt Restrictions Weight Bearing Restrictions: No       Mobility Bed Mobility Overal bed mobility: Needs Assistance Bed Mobility: Supine to Sit     Supine to sit: Min guard;HOB elevated     General bed  mobility comments: min guard for safety and line mgmt    Transfers Overall transfer level: Needs assistance Equipment used: Rolling walker (2 wheeled) Transfers: Stand Pivot Transfers Sit to Stand: Min assist Stand pivot transfers: Min assist       General transfer comment: Cues for use of AD, safety, hand placement with transfers    Balance Overall balance assessment: Needs assistance Sitting-balance support: Feet supported Sitting balance-Leahy Scale: Good     Standing balance support: Bilateral upper extremity supported;During functional activity Standing balance-Leahy Scale: Poor Standing balance comment: reliant on external support and +2 min assist for safety                           ADL either performed or assessed with clinical judgement   ADL Overall ADL's : Needs assistance/impaired     Grooming: Wash/dry hands;Sitting;Set up Grooming Details (indicate cue type and reason): washing hands with set- up from chair     Lower Body Bathing: Set up;Sitting/lateral leans;Supervison/ safety Lower Body Bathing Details (indicate cue type and reason): simulated via anterior pericare from toilet with set- up of toilet paper and supervision for safety         Toilet Transfer: Minimal assistance;+2 for safety/equipment;RW;Ambulation;Regular Toilet Toilet Transfer Details (indicate cue type and reason): MIN A +2 for safety d/t impulsivity and decreased safety awareness Toileting- Clothing Manipulation and Hygiene: Supervision/safety;Set up;Sitting/lateral lean       Functional mobility during ADLs: Minimal assistance;Rolling walker;+2 for safety/equipment General ADL Comments: pt completing functional mobility, toileting and seated grooming tasks     Vision  Perception     Praxis      Cognition Arousal/Alertness: Awake/alert Behavior During Therapy: Impulsive Overall Cognitive Status: History of cognitive impairments - at baseline                                  General Comments: pt with impaired safey awareness and noted to be impulsive with mobility, pt attempting to verbalize during session however difficult to interpret as pts speech garbled and difficult to understand, pt following commands with increased time        Exercises   Shoulder Instructions       General Comments pt on 5LPM 28% on trach collar, increased to 6LPM on venturi with SpO2 >90% during mobility, PMV donned during session    Pertinent Vitals/ Pain       Pain Assessment: Faces Faces Pain Scale: No hurt  Home Living                                          Prior Functioning/Environment              Frequency  Min 1X/week        Progress Toward Goals  OT Goals(current goals can now be found in the care plan section)  Progress towards OT goals: Progressing toward goals  Acute Rehab OT Goals Patient Stated Goal: not stated Time For Goal Achievement: 09/10/20 Potential to Achieve Goals: [place] Discharge plan remains appropriate;Frequency remains appropriate    Co-evaluation      Reason for Co-Treatment: For patient/therapist safety;To address functional/ADL transfers;Necessary to address cognition/behavior during functional activity PT goals addressed during session: Mobility/safety with mobility;Balance;Proper use of DME        AM-PAC OT "6 Clicks" Daily Activity     Outcome Measure   Help from another person eating meals?: A Lot Help from another person taking care of personal grooming?: A Little Help from another person toileting, which includes using toliet, bedpan, or urinal?: A Little Help from another person bathing (including washing, rinsing, drying)?: A Little Help from another person to put on and taking off regular upper body clothing?: A Little Help from another person to put on and taking off regular lower body clothing?: A Lot 6 Click Score: 16    End of Session Equipment  Utilized During Treatment: Rolling walker;Oxygen;Other (comment) (5-6 LPM trach collar)  OT Visit Diagnosis: Unsteadiness on feet (R26.81);History of falling (Z91.81);Other symptoms and signs involving cognitive function   Activity Tolerance Patient tolerated treatment well   Patient Left in chair;with call bell/phone within reach;with chair alarm set;with restraints reapplied   Nurse Communication Mobility status;Other (comment) (+2 back to bed, needs new purewick)        Time: 1113-1140 OT Time Calculation (min): 27 min  Charges: OT General Charges $OT Visit: 1 Visit OT Treatments $Self Care/Home Management : 8-22 mins  Harley Alto., COTA/L Acute Rehabilitation Services [place]   Precious Haws 09/02/2020, 1:12 PM

## 2020-09-02 NOTE — Plan of Care (Signed)
  Problem: Safety: Goal: Non-violent Restraint(s) Outcome: Progressing   Problem: Clinical Measurements: Goal: Ability to maintain clinical measurements within normal limits will improve Outcome: Progressing Goal: Diagnostic test results will improve Outcome: Progressing Goal: Respiratory complications will improve Outcome: Progressing Goal: Cardiovascular complication will be avoided Outcome: Progressing   Problem: Activity: Goal: Risk for activity intolerance will decrease Outcome: Progressing   Problem: Nutrition: Goal: Adequate nutrition will be maintained Outcome: Progressing   Problem: Coping: Goal: Level of anxiety will decrease Outcome: Progressing   Problem: Elimination: Goal: Will not experience complications related to urinary retention Outcome: Progressing   Problem: Pain Managment: Goal: General experience of comfort will improve Outcome: Progressing   Problem: Safety: Goal: Ability to remain free from injury will improve Outcome: Progressing   Problem: Skin Integrity: Goal: Risk for impaired skin integrity will decrease Outcome: Progressing   Problem: Education: Goal: Knowledge of General Education information will improve Description: Including pain rating scale, medication(s)/side effects and non-pharmacologic comfort measures Outcome: Not Progressing   Problem: Health Behavior/Discharge Planning: Goal: Ability to manage health-related needs will improve Outcome: Not Progressing

## 2020-09-02 NOTE — TOC Progression Note (Signed)
Transition of Care [facility]) - Progression Note    Patient Details  Name: Stacy Moran MRN: [id number] Date of Birth: May 05, 1955  Transition of Care [facility]) CM/SW [place], [place] Phone Number: 09/02/2020, 12:00 PM  Clinical Narrative:   CSW contacted patient's daughter to answer questions and provide update. Daughter, [name], asked about the referral to the palliative service and which provider that was. CSW explained options for palliative care service in the area, and daughter would like to move forward with [facility]. Also, daughter reported that she was supposed to meet with someone at the hospital at noon but can't remember who that was, and she is going to be late due to a doctor's appointment. CSW reached out to palliative care to try to determine who was supposed to be meeting with the daughter and update that she is running late, will not be here by noon. Patient continues to have no bed offers at this time, CSW to follow.    Expected Discharge Plan: [place] Barriers to Discharge: Continued Medical Work up  Expected Discharge Plan and Services Expected Discharge Plan: [place] In-house Referral: Clinical Social Work Discharge Planning Services: CM Consult Post Acute Care Choice: [place] arrangements for the past 2 months: Single Family Home                                       Social Determinants of Health (SDOH) Interventions    Readmission Risk Interventions No flowsheet data found.

## 2020-09-03 LAB — GLUCOSE, CAPILLARY
Glucose-Capillary: 140 mg/dL — ABNORMAL HIGH (ref 70–99)
Glucose-Capillary: 102 mg/dL — ABNORMAL HIGH (ref 70–99)
Glucose-Capillary: 97 mg/dL (ref 70–99)
Glucose-Capillary: 142 mg/dL — ABNORMAL HIGH (ref 70–99)
Glucose-Capillary: 153 mg/dL — ABNORMAL HIGH (ref 70–99)

## 2020-09-03 MED ORDER — POLYETHYLENE GLYCOL 3350 17 G PO PACK
17.0000 g | PACK | Freq: Two times a day (BID) | ORAL | Status: DC
  Administered 2020-09-03 – 2020-10-07 (×52): 17 g
  Filled 2020-09-03 (×64): qty 1

## 2020-09-03 MED ORDER — GUAIFENESIN 100 MG/5ML PO SOLN
5.0000 mL | Freq: Four times a day (QID) | ORAL | Status: DC | PRN
  Administered 2020-09-03 – 2020-09-22 (×12): 100 mg
  Filled 2020-09-03 (×12): qty 5

## 2020-09-03 MED ORDER — GUAIFENESIN ER 600 MG PO TB12: 600.0000 mg | ORAL_TABLET | Freq: Two times a day (BID) | ORAL | Status: DC

## 2020-09-03 MED ORDER — CHLORHEXIDINE GLUCONATE CLOTH 2 % EX PADS
6.0000 | MEDICATED_PAD | Freq: Every day | CUTANEOUS | Status: DC
  Administered 2020-09-03 – 2020-10-07 (×33): 6 via TOPICAL

## 2020-09-03 NOTE — Progress Notes (Signed)
Nutrition Follow-up  DOCUMENTATION CODES:  Not applicable  INTERVENTION:   Recommend initiation of bowel regimen.   -Continue 355 ml Osmolite 1.2 QID via PEG -Continue 45 ml Prosource TF BID via PEG -Continue 200 ml free water every 6 hours via PEG  Complete regimen provides 1784 kcals, 100 grams protein, and 1970 ml free water daily, meeting 100% of estimated kcal and protein needs  NUTRITION DIAGNOSIS:  Inadequate oral intake related to inability to eat as evidenced by NPO status. -- ongoing  GOAL:  Patient will meet greater than or equal to 90% of their needs -- met with TF  MONITOR:  Labs,Weight trends,TF tolerance,Skin,I & O's  REASON FOR ASSESSMENT:  Other (Comment)    ASSESSMENT:  65 year old woman with recent (winter 2021) ruptured PICA anuerysm resulting in [facility] with prolonged hospital stay requiring tracheostomy placement, thyroidectomy (for tracheostomy), and PEG tube placement presenting with acute worsening of her subdural hematomas after a fall today. She complains of residual reduction in bilateral grip strength. Daughter [name], works at [employer]) provides additional history. The patient has poor sleep and becomes agitated and confused at night at times. This morning, despite multiple home measures include alarms and lights, the patient fell down the stairs. Denies headaches, chest pain, abdomina pain, endorses only hand pain.  Pt admitted with acute on chronic SDH. Now pending discharge to SNF.   Pt remains on TC and is tolerating TF via PEG per RN. Current regimen: 355 ml Osmolite 1.2 QID, 45 ml Prosource Plus BID, and 200 ml free water flush 4 times daily. Complete regimen provides 1784 kcals, 100 grams protein, and 1964 ml free water daily, meeting 100% of estimated kcal and protein needs. Pt denies N/V/abdominal pain at this time. Note pt has not had a BM in several days, but denies feeling any urge to go. MD aware. Recommend initiation of bowel regimen.    UOP: 1047m x24 hours  Admit wt: 55.5 kg Current wt: 56.4 kg   Medications: . amLODipine  10 mg Per Tube Daily  . chlorhexidine  15 mL Mouth Rinse BID  . feeding supplement (OSMOLITE 1.2 CAL)  355 mL Per Tube TID WC & HS  . feeding supplement (PROSource TF)  45 mL Per Tube BID  . free water  200 mL Per Tube Q6H  . heparin  5,000 Units Subcutaneous Q8H  . hydrALAZINE  50 mg Per Tube Q8H  . labetalol  300 mg Per Tube TID  . levETIRAcetam  750 mg Per Tube BID  . levothyroxine  100 mcg Per Tube Q0600  . mouth rinse  15 mL Mouth Rinse q12n4p  . polyethylene glycol  17 g Per Tube BID  . QUEtiapine  50 mg Per Tube BID  . sodium chloride flush  3 mL Intravenous Once   Labs: Recent Labs  Lab 08/29/20 0645 08/31/20 0134 09/01/20 0942  NA 140 139 138  K 4.1 4.3 4.3  CL 104 103 103  CO2 _0 BUN _1 CREATININE 0.57 0.54 0.57  CALCIUM 9.5 9.6 9.9  GLUCOSE 101* 110* 110*  CBGs 140-102-97  Diet Order:   Diet Order            Diet NPO time specified  Diet effective now                EDUCATION NEEDS:  Not appropriate for education at this time  Skin:  Skin Assessment: Reviewed RN Assessment  Last BM:  Unknown  Height:  Ht Readings from Last 1 Encounters:  07/29/20 _0  (1.676 m)   Weight:  Wt Readings from Last 1 Encounters:  09/03/20 56.4 kg   Ideal Body Weight:  59.1 kg  BMI:  Body mass index is 20.07 kg/m.  Estimated Nutritional Needs:  Kcal:  5830-9407 Protein:  85-100 grams Fluid:  > 1.7 L   Larkin Ina, MS, RD, LDN RD pager number and weekend/on-call pager number located in [place].

## 2020-09-03 NOTE — Progress Notes (Signed)
FPTS Interim Progress Note  Patient sleeping and resting comfortably.  Rounded with primary RN. Reports attempts to get ut of bed when awake.  No other concerns voiced.  No orders required.  Appreciated nightly round.  Today's Vitals   09/02/20 2023 09/02/20 2303 09/02/20 2321 09/03/20 0315  BP: 110/67 126/74 (!) 144/109 110/67  Pulse: 89 83 70 74  Resp: 16 (!) 21 20 15   Temp:   98 F (36.7 C) 98.3 F (36.8 C)  TempSrc:   Oral Oral  SpO2: 100% 100% 99% 99%  Weight:    56.4 kg  PainSc:       Soft bed belt restraint reordered, release q4h. Sitter reordered OOB and in chair/ambulate q shift Fall precautions Delirium precautions Redirect patient as needed  Carollee Leitz, MD 09/03/2020, 4:09 AM PGY-2, [place] Medicine Service pager [phone]

## 2020-09-03 NOTE — Plan of Care (Signed)
  Problem: Safety: Goal: Non-violent Restraint(s) Outcome: Not Progressing   Problem: Safety: Goal: Non-violent Restraint(s) Outcome: Not Progressing   Problem: Education: Goal: Knowledge of General Education information will improve Description: Including pain rating scale, medication(s)/side effects and non-pharmacologic comfort measures Outcome: Progressing   Problem: Health Behavior/Discharge Planning: Goal: Ability to manage health-related needs will improve Outcome: Progressing   Problem: Clinical Measurements: Goal: Ability to maintain clinical measurements within normal limits will improve Outcome: Progressing Goal: Will remain free from infection Outcome: Progressing Goal: Diagnostic test results will improve Outcome: Progressing Goal: Respiratory complications will improve Outcome: Progressing Goal: Cardiovascular complication will be avoided Outcome: Progressing   Problem: Activity: Goal: Risk for activity intolerance will decrease Outcome: Progressing   Problem: Nutrition: Goal: Adequate nutrition will be maintained Outcome: Progressing   Problem: Coping: Goal: Level of anxiety will decrease Outcome: Progressing   Problem: Elimination: Goal: Will not experience complications related to bowel motility Outcome: Progressing Goal: Will not experience complications related to urinary retention Outcome: Progressing   Problem: Pain Managment: Goal: General experience of comfort will improve Outcome: Progressing   Problem: Safety: Goal: Ability to remain free from injury will improve Outcome: Progressing   Problem: Skin Integrity: Goal: Risk for impaired skin integrity will decrease Outcome: Progressing

## 2020-09-03 NOTE — TOC Progression Note (Signed)
Transition of Care [facility]) - Progression Note    Patient Details  Name: Stacy Moran MRN: [id number] Date of Birth: 18-Jul-1955  Transition of Care [facility]) CM/SW [place], [place] Phone Number: 09/03/2020, 1:35 PM  Clinical Narrative:   CSW received update from [place] and [place] that they will be able to admit the patient. CSW attempted to reach the patient's daughter to provide update, left a voicemail. CSW updated MD that there are bed offers for the patient, possible discharge next week if we can work on removing the sitter and restraints from the patient. CSW to continue to follow.    Expected Discharge Plan: [place] Barriers to Discharge: Continued Medical Work up  Expected Discharge Plan and Services Expected Discharge Plan: [place] In-house Referral: Clinical Social Work Discharge Planning Services: CM Consult Post Acute Care Choice: [place] arrangements for the past 2 months: Single Family Home                                       Social Determinants of Health (SDOH) Interventions    Readmission Risk Interventions No flowsheet data found.

## 2020-09-03 NOTE — Progress Notes (Signed)
Family Medicine Teaching Service Daily Progress Note Intern Pager: [phone]  Patient name: Stacy Moran Medical record number: [id number] Date of birth: [birth year] Age: 65 y.o. Gender: female  Primary Care Provider: [name], MD Consultants: Palliative Code Status: DNR  Pt Overview and Major Events to Date:  5/31 - Admitted 6/8 - Palliative discussion, patient DNR Medically stable for discharge to SNF  Assessment and Plan: Stacy Moran a 65 y.o.femalepresenting s/p fall admitted forincreasedweakness and pain since fallthis morning. 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  Recent fall 2/2 unsteady gait and weakness Posey belt and mitts in place due to fall risk. Consider discontinuing mitts during the day if nursing feels appropriate and won't put patient at high risk for adverse event.  - Clonazepam 0.5mg  QHS PRN  - Seroquel 50mg  BID - Weekly ECG (monitor QTc) - Fall and delirium precautions - PT/OT following - 1:1 sitter, posey belt, and mitts as needed for patient safety  GOC Patient has been made DNR after conversation with daughter and palliative care. - SW following for SNF placement  Hypertension BP in the last 24 hours 110-146/67-109. -Continue home amlodipine 10 mg daily, hydralazine 50 mg 3 times daily, labetalol 300mg  daily  History of seizures - Continue Keppra 1000 mg daily  Dysphagia with PEG tube -Speech following, high risk for aspiration -Frequent oral care -Continue Osmolite with free water flush  Trach dependent s/p tracheostomy Continues to be on 5 L O2 -Continue Robinul 2 mg twice daily -Oral/trach suction as needed  Hypothyroidism s/p total thyroidectomy Continue Synthroid 100 mcg daily   FEN/GI:NPO, PEG tube- Osmolite 1.2 335 cc QID with 200 cc Free Water  flush EXN:TZGYFVC subcutaneous    Status is: Inpatient  Remains inpatient appropriate because:Unsafe d/c plan   Dispo: The patient is from: Home              Anticipated d/c is to: SNF              Patient currently is medically stable to d/c.   Difficult to place patient No    Subjective:  Patient reports that she is feeling good this morning, has no complaints. She was very happy to have been able to see her daughter yesterday. States that she has not had a bowel movement in several days that she does not feel any abdominal pain or like she really needs to go right now.   Objective: Temp:  [98 F (36.7 C)-98.9 F (37.2 C)] 98.9 F (37.2 C) (06/08 0723) Pulse Rate:  [70-89] 83 (06/08 0419) Resp:  [14-21] 14 (06/08 0419) BP: (110-146)/(67-109) 139/76 (06/08 0723) SpO2:  [99 %-100 %] 100 % (06/08 0419) FiO2 (%):  [21 %] 21 % (06/08 0419) Weight:  [56.4 kg] 56.4 kg (06/08 0315) Physical Exam: General: well-appearing, NAD, supine in bed with mitts in place Cardiovascular: RRR, no murmur appreciated but hard to hear over expiratory sounds Respiratory: breathing comfortably with tracheostomy collar at 5L, no increased WOB Abdomen: soft, non-tender, non-distended, bowel sounds present Extremities: moving all equally and appropriately, mitts and posey belt in place  Laboratory: Recent Labs  Lab 08/29/20 0208 08/31/20 0134 09/01/20 0942  WBC 6.2 6.2 7.3  HGB 11.3* 10.9* 11.5*  HCT 36.2 35.2* 36.8  PLT 152 172 194   Recent Labs  Lab 08/29/20 0645 08/31/20 0134 09/01/20 0942  NA 140 139 138  K 4.1 4.3 4.3  CL 104 103 103  CO2 26 30 27   BUN 18 20 23   CREATININE 0.57 0.54 0.57  CALCIUM 9.5 9.6 9.9  GLUCOSE 101* 110* 110*     Imaging/Diagnostic Tests: No results found.   Stacy Patience, DO 09/03/2020, 8:53 AM PGY-1, [facility] Intern pager: [phone], text pages welcome

## 2020-09-04 LAB — GLUCOSE, CAPILLARY
Glucose-Capillary: 112 mg/dL — ABNORMAL HIGH (ref 70–99)
Glucose-Capillary: 114 mg/dL — ABNORMAL HIGH (ref 70–99)
Glucose-Capillary: 128 mg/dL — ABNORMAL HIGH (ref 70–99)
Glucose-Capillary: 142 mg/dL — ABNORMAL HIGH (ref 70–99)
Glucose-Capillary: 156 mg/dL — ABNORMAL HIGH (ref 70–99)
Glucose-Capillary: 169 mg/dL — ABNORMAL HIGH (ref 70–99)

## 2020-09-04 MED ORDER — SENNA 8.6 MG PO TABS
1.0000 | ORAL_TABLET | Freq: Every day | ORAL | Status: DC
  Administered 2020-09-04 – 2020-09-05 (×2): 8.6 mg via ORAL
  Filled 2020-09-04 (×2): qty 1

## 2020-09-04 NOTE — Progress Notes (Signed)
  Speech Language Pathology Treatment: Dysphagia;[place] Speaking valve  Patient Details Name: Stacy Moran MRN: [id number] DOB: 13-Oct-1955 Today's Date: 09/04/2020 Time: 1000-1035 SLP Time Calculation (min) (ACUTE ONLY): 35 min  Assessment / Plan / Recommendation Clinical Impression  Pt demonstrates improved mentation since MBS last week. Pt now able to follow commands more consistently, though still very distractible. Pt is wearing PMSV during all waking hours. In conversation pt benefits from frequent verbal cues to repeat with increased volume to improved intelligibility, achieving at best 75% intelligibility. Pts vocal quality is persistently wet at baseline and despite cues she cannot clear. There are no secretions at trach hub. It given minimal PO trials pt has multiple swallows and prolonged coughing indicative of aspiration, consistent with impairments on MBS. Pt needs medical management of airway; she will not be likely to successfully swallow with current anatomy.   HPI HPI: Pt is a 65 year old woman with who presented with acute worsening of subdural hematomas after fall. MRI brain 5/31: "Unchanged bilateral subdural hematomas and trace rightward midline shift." Neurosurgery considering increasing the setting on her shunt as of 5/31. Pt d/c from CIR NPO with PEG has not comlpeted any SLP therapy or swallow evaluations since 05/20/20. At that time pt still not tolearting PMSV well. 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  MBS 02/19/20 profound dysphagia with abnormal anatomy. Repeat MBS 02/26/21 minimal improvement- decr edema however significant subglottic edema/stenosis narrowing subglottic space. Bolus stops at UES with minimal opening with aspiration during/post swalow- continue NPO. MBS 04/09/20: severe pharyngoesophageal dysphagia with minimal  improvements. subglottic edema was decreased but UES function continued to be significantly impaired in addition to tongue base retraction and pharyngeal contraction all contributing to pharyngeal stasis. Pt was also seen ENT. On 02/29/20 Dr Constance Holster performed laryngoscopy reporting "The cords were thickened and swollen.  There is significant subglottic swelling especially on the left side and posteriorly.  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  As the anesthesia was wearing off the cords were inspected.  Deep suctioning through the tracheostomy was used to elicit a cough.  There definitely seem to be movement of the right cord.  The left cord may be paretic but it is a little hard to tell... Over the next several months we can continue evaluation to see how the subglottic airway grasses.  She may need future procedures to open the subglottis if necessary"      SLP Plan  Continue with current plan of care       Recommendations  Diet recommendations: NPO      Patient may use Passy-Muir Speech Valve: During all waking hours (remove during sleep)         Oral Care Recommendations: Oral care QID Follow up Recommendations: Skilled Nursing facility Plan: Continue with current plan of care       GO                Harvy Riera, [name] 09/04/2020, 12:57 PM

## 2020-09-04 NOTE — Progress Notes (Signed)
Palliative-   Received a message that daughter called with questions. I called [name] but no answer- left message for her.   Mariana Kaufman, AGNP-C Palliative Medicine  No charge

## 2020-09-04 NOTE — Progress Notes (Signed)
Daily Progress Note   Patient Name: Stacy Moran       Date: 09/04/2020 DOB: [birth year]  Age: 65 y.o. MRN#: [id number] Attending Physician: Kinnie Feil, MD Primary Care Physician: [name], MD Admit Date: 08/26/2020  Reason for Consultation/Follow-up: Establishing goals of care  Subjective: Stacy Moran is sitting in bed calmly today. No mitts. She is pleasant, asking for another blanket.  Spoke with daughterRickard Moran. Reviewed social worker's note with Stacy Moran regarding bed offers.  Review of Systems  Unable to perform ROS: Mental acuity   Length of Stay: 7  Current Medications: Scheduled Meds:  . amLODipine  10 mg Per Tube Daily  . chlorhexidine  15 mL Mouth Rinse BID  . Chlorhexidine Gluconate Cloth  6 each Topical Daily  . feeding supplement (OSMOLITE 1.2 CAL)  355 mL Per Tube TID WC & HS  . feeding supplement (PROSource TF)  45 mL Per Tube BID  . free water  200 mL Per Tube Q6H  . heparin  5,000 Units Subcutaneous Q8H  . hydrALAZINE  50 mg Per Tube Q8H  . labetalol  300 mg Per Tube TID  . levETIRAcetam  750 mg Per Tube BID  . levothyroxine  100 mcg Per Tube Q0600  . mouth rinse  15 mL Mouth Rinse q12n4p  . polyethylene glycol  17 g Per Tube BID  . QUEtiapine  50 mg Per Tube BID  . senna  1 tablet Oral Daily  . sodium chloride flush  3 mL Intravenous Once    Continuous Infusions:   PRN Meds: acetaminophen, clonazePAM, guaiFENesin  Physical Exam Vitals and nursing note reviewed.  Pulmonary:     Effort: Pulmonary effort is normal.     Comments: Trach collar Neurological:     Mental Status: She is alert. Mental status is at baseline.  Psychiatric:     Comments: pleasant            Vital Signs: BP (!) 143/88 (BP Location: Right Arm)   Pulse 80   Temp 98 F  (36.7 C) (Oral)   Resp 16   Wt 56.4 kg   SpO2 100%   BMI 20.07 kg/m  SpO2: SpO2: 100 % O2 Device: O2 Device: Room Air O2 Flow Rate: O2 Flow Rate (L/min): 5 L/min  Intake/output summary:  Intake/Output Summary (Last  24 hours) at 09/04/2020 1549 Last data filed at 09/04/2020 0600 Gross per 24 hour  Intake --  Output 1550 ml  Net -1550 ml   LBM: Last BM Date:  (PTA) Baseline Weight: Weight: 55.5 kg Most recent weight: Weight: 56.4 kg       Palliative Assessment/Data: PPS: 50%    Flowsheet Rows    Flowsheet Row Most Recent Value  Intake Tab   Referral Department --  [FMTS]  Unit at Time of Referral Cardiac/Telemetry Unit  Palliative Care Primary Diagnosis Neurology  Date Notified 08/28/20  Palliative Care Type Return patient Palliative Care  Reason for referral Clarify Goals of Care  Date of Admission 08/26/20  Date first seen by Palliative Care 08/29/20  # of days Palliative referral response time 1 Day(s)  # of days IP prior to Palliative referral 2  Clinical Assessment   Psychosocial & Spiritual Assessment   Palliative Care Outcomes        Patient Active Problem List   Diagnosis Date Noted  . Fall (on) (from) other stairs and steps, initial encounter 08/26/2020  . Subdural hematoma ([place]) 07/31/2020  . History of hemorrhagic cerebrovascular accident (CVA) with residual deficit 06/06/2020  . 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 S/P ventriculoperitoneal shunt 06/06/2020  . Functional urinary incontinence 06/06/2020  . Ineffective airway clearance   . Thrombocytopenia ([place])   . Restlessness   . Restlessness and agitation   . Acute blood loss anemia   . Essential hypertension   . Seizure prophylaxis   . Protein-calorie malnutrition, severe 04/24/2020  . Dysphagia, post-stroke   . Benign essential HTN   . Dysphagia   . Status post tracheostomy ([place])   . S/P percutaneous endoscopic gastrostomy (PEG) tube placement ([place])   . ICH (intracerebral  hemorrhage) ([place]) 04/22/2020  . Obstructive hydrocephalus ([place])   . Seizure-like activity ([place]) 03/12/2020  . PEG (percutaneous endoscopic gastrostomy) status ([place]) 03/08/2020  . Hyperglycemia 03/08/2020  . Acute respiratory failure ([place])   . Ventilator dependence ([place])   . 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 Tracheostomy status ([place]) 02/19/2020  . Abdominal distention   . 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 SAH (subarachnoid hemorrhage) ([place])   . Subarachnoid bleed ([place])   . Tachypnea   . Prediabetes   . Hypokalemia   . Leukocytosis   . Ileus, postoperative ([place])   . Pressure injury of skin 02/13/2020  . Ruptured cerebral aneurysm [facility]) 01/30/2020    Palliative Care Assessment & Plan   Patient Profile: 65 y.o. 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  Diagnosed with acute on chronic subdural hematoma. PMT consulted for assistance with symptom management. Received call from patient's daughter requesting goals of care discussion.   Assessment/Recommendations/Plan  Has bed offers Recommend continue current scope care with outpatient Palliative to see at discharge- [facility] in [facility]  Goals of Care and Additional Recommendations: Limitations on Scope of Treatment: Full Scope Treatment  Code Status: DNR  Prognosis:  Unable to determine  Discharge Planning: [place] for rehab with Palliative care service follow-up  Care plan was discussed with patient's daughter- Stacy Moran.  Thank you for allowing the Palliative Medicine Team to assist in the care of this patient.   Total time: 28 minutes  Greater than  50%  of this time was spent counseling and coordinating care related to the above assessment and plan.  Mariana Kaufman,  AGNP-C Palliative Medicine   Please contact Palliative Medicine Team phone at [phone] for questions and concerns.

## 2020-09-04 NOTE — Progress Notes (Signed)
FPTS Interim Progress Note  Patient sleeping and resting comfortably.  Soft bed belt restraint remains on. Rounded with primary RN. No concerns voiced.  Appreciated nightly round.  Today's Vitals   09/03/20 1931 09/03/20 2000 09/03/20 2321 09/04/20 0348  BP:  118/72    Pulse: 77 70 74 79  Resp: (!) 23 20 16 15   Temp:  98.8 F (37.1 C)    TempSrc:  Oral    SpO2: 99% 100% 97% 97%  Weight:      PainSc:       Renewed soft restraint.  Reassess daily Continue to monitor  Carollee Leitz, MD 09/04/2020, 3:54 AM PGY-2, [place] Medicine Service pager [phone]

## 2020-09-04 NOTE — Progress Notes (Signed)
PT Cancellation Note  Patient Details Name: Janavia Rottman MRN: [id number] DOB: 12-13-1955   Cancelled Treatment:    Reason Eval/Treat Not Completed: Patient declined, no reason specified;Fatigue/lethargy limiting ability to participate.  Pt fatigued, asking PT to come back in an hour (PT reports to pt that I will not be here in an hour).  Pt then asked PT to come back tomorrow.  RN reports pt may be a better AM participant as that is when she is really restless and trying to get OOB.  She is fatigued now.    Thanks,  Verdene Lennert, PT, DPT  Acute Rehabilitation Ortho Tech Supervisor [phone] pager #[phone] office      Wells Guiles B Alona Danford 09/04/2020, 5:10 PM

## 2020-09-04 NOTE — Progress Notes (Addendum)
Family Medicine Teaching Service Daily Progress Note Intern Pager: [phone]  Patient name: Stacy Moran Medical record number: [id number] Date of birth: September 03, 1955 Age: 65 y.o. Gender: female  Primary Care Provider: [name], MD Consultants: Palliative Code Status: DNR  Pt Overview and Major Events to Date:  Stacy Moran is a 65 y.o. female that presented after a fall with a history of subarachnoid hemorrhage now stable; patient remains hospitalized for SNF placement. 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  Assessment and Plan: 5/31 - Admitted 6/8 - Palliative discussion, patient DNR Patient currently requires safety sitter due to fall risk, will be removed when ready for SNF placement.  Recent fall with unsteady gait  Agitation Patient now has a possible bed for SNF placement. Patient has been on non-violent restraints due to decreased staff and patient trying to get out of bed while being a fall risk. We will need to steadily decrease the amount of time patient requires restraints and have none in use prior to discharge. Will talk with daughter to see if someone could be present at night to decrease the need for restraints and sitter. - Fall and delirium precautions - Continue clonazepam and Seroquel - PT/OT following - One-to-one sitter, Posey belt and mitts as needed for patient safety, though encourage redirection and avoidance of restraints if possible  Constipation Patient has not had a bowel movement since admission that has been documented.  Patient is unable to say if she has had one or not.  We started her on MiraLAX previously and still has not had any stool. - MiraLAX twice daily - Adding senna daily  Trach dependent s/p tracheostomy Yesterday patient had significant thick secretions. Robinol was  held and mucinex started to trial out thinning the secretions.  Secretions are thinner per nurse, but did have to have extra suction.  We will continue to monitor throughout the day and consider switching back from Mucinex to Robinul if having to have increasing need for suction - Oral/trach suction as necessary - Continue Mucinex and monitor secretions   FEN/GI: NPO, PEG tube Osmolite PPx: Heparin subcutaneous   Status is: Inpatient  Remains inpatient appropriate because:Unsafe d/c plan  Dispo: The patient is from: Home              Anticipated d/c is to: SNF              Patient currently is not medically stable to d/c. Due to need for safety sitter in the setting of increased fall risk, which will be discontinued when ready for SNF placement.   Difficult to place patient No     Subjective:  Patient reports that she is frustrated overall, she feels okay today but appears that she wishes her daughter was able to be here more.  Objective: Temp:  [97.7 F (36.5 C)-98.8 F (37.1 C)] 98 F (36.7 C) (06/09 0743) Pulse Rate:  [70-110] 73 (06/09 0840) Resp:  [15-27] 18 (06/09 0847) BP: (103-143)/(57-88) 143/88 (06/09 0743) SpO2:  [96 %-100 %] 99 % (06/09 0840) FiO2 (%):  [21 %] 21 % (06/09 0348) Physical Exam: General: NAD, sitting up in bed Cardiovascular: RRR, no peripheral edema Respiratory: breathing comfortably on room air as oxygen was out of place and O2 saturation was not registering, no increased WOB Abdomen: Soft, nontender, nondistended, Posey belt in place Extremities: Moving all extremities  equally and appropriately, no mitts present  Laboratory: Recent Labs  Lab 08/29/20 0208 08/31/20 0134 09/01/20 0942  WBC 6.2 6.2 7.3  HGB 11.3* 10.9* 11.5*  HCT 36.2 35.2* 36.8  PLT 152 172 194   Recent Labs  Lab 08/29/20 0645 08/31/20 0134 09/01/20 0942  NA 140 139 138  K 4.1 4.3 4.3  CL 104 103 103  CO2 26 30 27   BUN 18 20 23   CREATININE 0.57 0.54 0.57   CALCIUM 9.5 9.6 9.9  GLUCOSE 101* 110* 110*      Imaging/Diagnostic Tests: No results found.   Rise Patience, DO 09/04/2020, 9:01 AM PGY-1, [place] Intern pager: [phone], text pages welcome

## 2020-09-04 NOTE — Progress Notes (Signed)
Occupational Therapy Treatment Patient Details Name: Stacy Moran MRN: [id number] DOB: [birth year] Today's Date: 09/04/2020    History of present illness 65 y/o female presented to ED on 5/31 with generalized waekness and L arm pain/weakness s/p fall down the stairs. 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 Radiographs negative for acute fxs. PMH: SAH s/p PICA aneurysm clipping, bilateral SDH, seizures, chronic tracheostomy, PEG, seizures, HTN, GERD, anxiety   OT comments  Patient restless and sitting up in bed, no pain expressed with Passey muir valve in place.  Patient with incremental gains to patient focused OT goals.  Patient was able to perform basic bed mobility with min guard due to restlessness and impulsivity.  Hand held assist with gait belt, min a, and moderate cues to mobilize to sink.  Patient, with chair placed behind her, needing cues, balance assist and Min A to complete stand grooming.  Patient declined up to the recliner, and returned to bed.  Restraints returned as found.  SNF continues to be recommended due to decreased level of assist at home given care needed.  OT will continue to follow in the acute setting to maximize functional status.    Follow Up Recommendations  SNF    Equipment Recommendations  None recommended by OT    Recommendations for Other Services      Precautions / Restrictions Precautions Precautions: Fall Precaution Comments: mitts and tele sitter Restrictions Weight Bearing Restrictions: No       Mobility Bed Mobility Overal bed mobility: Needs Assistance Bed Mobility: Supine to Sit;Sit to Supine     Supine to sit: Supervision Sit to supine: Min assist;Min guard   General bed mobility comments: repositioning Patient Response: Restless  Transfers Overall transfer level: Needs assistance Equipment used: 1 person hand held assist   Sit to Stand: Min assist Stand  pivot transfers: Min assist;Mod assist            Balance Overall balance assessment: Needs assistance Sitting-balance support: Feet supported Sitting balance-Leahy Scale: Fair     Standing balance support: Single extremity supported Standing balance-Leahy Scale: Poor Standing balance comment: reliant on external support and gait belt                           ADL either performed or assessed with clinical judgement   ADL       Grooming: Wash/dry hands;Wash/dry face;Standing;Minimal assistance;Min guard                               Functional mobility during ADLs: Minimal assistance;Moderate assistance       Vision       Perception     Praxis      Cognition Arousal/Alertness: Awake/alert Behavior During Therapy: Impulsive Overall Cognitive Status: History of cognitive impairments - at baseline                                                            Pertinent Vitals/ Pain       Pain Assessment: No/denies pain Pain Intervention(s): Monitored during session  Frequency  Min 1X/week        Progress Toward Goals  OT Goals(current goals can now be found in the care plan section)  Progress towards OT goals: Progressing toward goals  Acute Rehab OT Goals Patient Stated Goal: not stated OT Goal Formulation: Patient unable to participate in goal setting Time For Goal Achievement: 09/10/20 Potential to Achieve Goals: [place] Discharge plan remains appropriate;Frequency remains appropriate    Co-evaluation                 AM-PAC OT "6 Clicks" Daily Activity     Outcome Measure   Help from another person eating meals?: A Little Help from another person taking care of personal grooming?: A Little Help from another person toileting, which includes using toliet, bedpan, or urinal?: A Little Help from another person  bathing (including washing, rinsing, drying)?: A Little Help from another person to put on and taking off regular upper body clothing?: A Little Help from another person to put on and taking off regular lower body clothing?: A Lot 6 Click Score: 17    End of Session Equipment Utilized During Treatment: Gait belt  OT Visit Diagnosis: Unsteadiness on feet (R26.81);History of falling (Z91.81);Other symptoms and signs involving cognitive function   Activity Tolerance Patient tolerated treatment well   Patient Left in bed;with call bell/phone within reach;with bed alarm set;Other (comment) Chief Operating Officer)   Nurse Communication          Time: [phone] OT Time Calculation (min): 14 min  Charges: OT General Charges $OT Visit: 1 Visit OT Treatments $Self Care/Home Management : 8-22 mins  09/04/2020  Rich, OTR/L  Acute Rehabilitation Services  Office:  [place] 09/04/2020, 2:30 PM

## 2020-09-05 DIAGNOSIS — S82891A Other fracture of right lower leg, initial encounter for closed fracture: Secondary | ICD-10-CM

## 2020-09-05 LAB — GLUCOSE, CAPILLARY
Glucose-Capillary: 114 mg/dL — ABNORMAL HIGH (ref 70–99)
Glucose-Capillary: 115 mg/dL — ABNORMAL HIGH (ref 70–99)
Glucose-Capillary: 125 mg/dL — ABNORMAL HIGH (ref 70–99)
Glucose-Capillary: 160 mg/dL — ABNORMAL HIGH (ref 70–99)
Glucose-Capillary: 184 mg/dL — ABNORMAL HIGH (ref 70–99)
Glucose-Capillary: 197 mg/dL — ABNORMAL HIGH (ref 70–99)

## 2020-09-05 MED ORDER — SENNA 8.6 MG PO TABS
1.0000 | ORAL_TABLET | Freq: Every day | ORAL | Status: DC
  Administered 2020-09-06 – 2020-10-06 (×27): 8.6 mg
  Filled 2020-09-05 (×32): qty 1

## 2020-09-05 NOTE — Progress Notes (Signed)
Palliative-   HPI: 65 y.o. 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  Diagnosed with acute on chronic subdural hematoma. PMT consulted for assistance with symptom management. Received call from patient's daughter requesting goals of care discussion.   Discussion: Patient is working with PT. She is awake, alert, pleasant and cooperative. When we discuss her possible discharge from the hospital to a nursing facility close to her daughter's house, she smiles and says this plan makes her  happy.   Assessment/Plan:  D/C to SNF Recommend outpatient Palliative to follow MOST form in [place], AGNP-C Palliative Medicine  Greater than 50%  of this time was spent counseling and coordinating care related to the above assessment and plan.  Total time: 18 mins

## 2020-09-05 NOTE — Progress Notes (Signed)
FPTS Interim Progress Note  Patient alert and resting comfortably.  No complaints. Rounded with primary RN and reports significant improvement in secretions since he had seen patient 6 nights ago.   No concerns voiced.  No orders required.  Appreciated nightly round.  Today's Vitals   09/04/20 2304 09/05/20 0000 09/05/20 0008 09/05/20 0316  BP: 111/75  123/73 124/82  Pulse: 68  76 74  Resp: 16  17 15   Temp:   98.5 F (36.9 C)   TempSrc:   Oral   SpO2: 100%  99% 100%  Weight:      PainSc:  Asleep     Continue telemonitoring.  Reassess in am Monitor for worsening secretions  Carollee Leitz, MD 09/05/2020, 4:09 AM PGY-2, [place] Medicine Service pager [phone]

## 2020-09-05 NOTE — Progress Notes (Signed)
Family Medicine Teaching Service Daily Progress Note Intern Pager: [phone]  Patient name: Kinleigh Nault Medical record number: [id number] Date of birth: 06-12-1955 Age: 65 y.o. Gender: female  Primary Care Provider: [name], MD Consultants: Palliative Code Status: DNR  Pt Overview and Major Events to Date:  5/31 - admitted 6/8 - DNR per palliative discussion Patient requires sitter for fall risk, will be able to discontinue close to SNF placement time.  Assessment and Plan: Jakki Doughty is a 65 y.o. female that presented after fall now awaiting SNF placement. 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  Recent fall with unsteady gait  Agitation Patient remains with restraints in place as there has not been enough staff for a sitter to ensure she does not get up and fall. Plan to discontinue all retraints and sitter prior to discharge to SNF. - Fall and delirium precautions - Continue clonazepam and seroquel - PT/OT following - 1:1 sitter, Posey belt and mitts PRN for safety, prefer redirecting and avoiding restraints as able  Constipation  PEG tube  Has not had a bowel movement since admission. Will consider adding mag citrate if patient does not have a bowel movement today. Patient states that she is not getting food to have a bowel movement and that she used to eat - Miralax twice daily - Senna daily - SLP to re-eval as patient states she previously ate by mouth  Trach dependent s/p tracheostomy Secretions thinner and much improved per overnight nursing, if suctioning and secretions become increased we can switch back to [place]. - Oral/trach suction as necessary - Continue mucinex and monitor secretions  FEN/GI: NPO, PEG tube osmolite PPx: Subcutaneous heparin   Status is: Inpatient  Remains inpatient  appropriate because:Unsafe d/c plan  Dispo: The patient is from: Home              Anticipated d/c is to: SNF              Patient currently is not medically stable to d/c. Due to still needing a 1:1 sitter while awaiting ability to go to SNF, will be discontinued prior to planned discharge.   Difficult to place patient No     Subjective:  Patient is feeling well today, no complaints. She has not pooped yet, but states that she was eating at home previously and that is probably why she isn't pooping right now. She does not have any pain anywhere and is glad to not have the oxygen.  Objective: Temp:  [98.1 F (36.7 C)-98.9 F (37.2 C)] 98.5 F (36.9 C) (06/10 0822) Pulse Rate:  [68-86] 73 (06/10 0822) Resp:  [15-18] 16 (06/10 0822) BP: (111-139)/(73-89) 128/87 (06/10 0822) SpO2:  [98 %-100 %] 100 % (06/10 0822) FiO2 (%):  [21 %] 21 % (06/09 1502) Physical Exam: General: NAD, supine in bed, in good spirits and joking around Cardiovascular: RRR, no murmur appreciated Respiratory: breathing comfortably on room air, no increased WOB Abdomen: soft, non-tender, non-distended, lap belt in place Extremities: moving all equally and appropriately, no mitts on  Laboratory: Recent Labs  Lab 08/31/20 0134 09/01/20 0942  WBC 6.2 7.3  HGB 10.9* 11.5*  HCT 35.2* 36.8  PLT 172 194   Recent Labs  Lab 08/31/20 0134 09/01/20 0942  NA 139 138  K 4.3 4.3  CL 103 103  CO2 30 27  BUN 20  23  CREATININE 0.54 0.57  CALCIUM 9.6 9.9  GLUCOSE 110* 110*      Imaging/Diagnostic Tests: No results found.   Rise Patience, DO 09/05/2020, 9:05 AM PGY-1, [place] Intern pager: [phone], text pages welcome

## 2020-09-05 NOTE — Progress Notes (Signed)
Called patient's daughter [name] regarding planning for discharge. She says that she just left messages with the nursing facilities that have available beds to try and get a tour before they close today. She has been made aware of the fact that the patient will have to be fully out of restraints without a sitter for at least 24h prior to discharge to SNF and she is willing and able to be here when we need to do this. She appreciated the call and I let her know someone from our team is always here and to reach out if she needs anything or has any questions.   Trexton Escamilla, DO

## 2020-09-05 NOTE — TOC Progression Note (Signed)
Transition of Care [facility]) - Progression Note    Patient Details  Name: Stacy Moran MRN: [id number] Date of Birth: 11-May-1955  Transition of Care [facility]) CM/SW [place], [place] Phone Number: 09/05/2020, 2:53 PM  Clinical Narrative:   CSW spoke with patient's daughter over the phone to provide bed offers. Daughter will research options available and get back to CSW with choice. Patient still not medically ready at this time as she continues to require restraints and a sitter to maintain safety. Plan to move patient closer to the nurses station to see if restraints can be removed. CSW to continue to follow.    Expected Discharge Plan: [place] Barriers to Discharge: Continued Medical Work up  Expected Discharge Plan and Services Expected Discharge Plan: [place] In-house Referral: Clinical Social Work Discharge Planning Services: CM Consult Post Acute Care Choice: [place] arrangements for the past 2 months: Single Family Home                                       Social Determinants of Health (SDOH) Interventions    Readmission Risk Interventions No flowsheet data found.

## 2020-09-05 NOTE — TOC Progression Note (Signed)
Transition of Care [facility]) - Progression Note    Patient Details  Name: Marionna Gonia MRN: [id number] Date of Birth: June 24, 1955  Transition of Care [facility]) CM/SW [place], [place] Phone Number: 09/05/2020, 9:09 AM  Clinical Narrative:   CSW attempting to reach patient's daughter, [name], again today. CSW called and left another message, attempted to call again later in the day with no answer. CSW to follow.    Expected Discharge Plan: [place] Barriers to Discharge: Continued Medical Work up  Expected Discharge Plan and Services Expected Discharge Plan: [place] In-house Referral: Clinical Social Work Discharge Planning Services: CM Consult Post Acute Care Choice: [place] arrangements for the past 2 months: Single Family Home                                       Social Determinants of Health (SDOH) Interventions    Readmission Risk Interventions No flowsheet data found.

## 2020-09-05 NOTE — Progress Notes (Signed)
Physical Therapy Treatment Patient Details Name: Stacy Moran MRN: [id number] DOB: November 18, 1955 Today's Date: 09/05/2020    History of Present Illness 65 y/o female presented to ED on 5/31 with generalized waekness and L arm pain/weakness s/p fall down the stairs. 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 Radiographs negative for acute fxs. PMH: SAH s/p PICA aneurysm clipping, bilateral SDH, seizures, chronic tracheostomy, PEG, seizures, HTN, GERD, anxiety    PT Comments    Pt was seen for mobility but declined to get OOB.  Nursing in to try to convince her to work on gait but still declined.  Follow up with her for more work on standing and gait, and will progress her as tolerated.  Pt is appropriate for SNF care, but has family involvement that may divert her to home.   Follow Up Recommendations  SNF     Equipment Recommendations  None recommended by PT    Recommendations for Other Services       Precautions / Restrictions Precautions Precautions: Fall Precaution Comments: tele sitter Restrictions Weight Bearing Restrictions: No    Mobility  Bed Mobility Overal bed mobility: Needs Assistance Bed Mobility: Rolling Rolling: Mod assist              Transfers                 General transfer comment: declined to try  Ambulation/Gait                 Stairs             Wheelchair Mobility    Modified Rankin (Stroke Patients Only)       Balance Overall balance assessment: Needs assistance                                          Cognition Arousal/Alertness: Awake/alert Behavior During Therapy: Flat affect Overall Cognitive Status: History of cognitive impairments - at baseline                                 General Comments: pt is partially understandable today, assisting with LE movement      Exercises General Exercises - Lower  Extremity Ankle Circles/Pumps: AAROM;5 reps Gluteal Sets: AROM;10 reps Heel Slides: AAROM;10 reps Hip ABduction/ADduction: AAROM;10 reps Hip Flexion/Marching: AAROM;10 reps    General Comments General comments (skin integrity, edema, etc.): Pt was assisted to move legs, tolerating ROM with help and then repositioned for comfort      Pertinent Vitals/Pain Pain Assessment: No/denies pain    Home Living                      Prior Function            PT Goals (current goals can now be found in the care plan section) Acute Rehab PT Goals Patient Stated Goal: not stated Progress towards PT goals: Not progressing toward goals - comment    Frequency    Min 3X/week      PT Plan Current plan remains appropriate    Co-evaluation              AM-PAC PT "6 Clicks" Mobility   Outcome Measure  Help needed turning from your back to your side  while in a flat bed without using bedrails?: A Little Help needed moving from lying on your back to sitting on the side of a flat bed without using bedrails?: A Lot Help needed moving to and from a bed to a chair (including a wheelchair)?: A Lot Help needed standing up from a chair using your arms (e.g., wheelchair or bedside chair)?: A Little Help needed to walk in hospital room?: A Lot Help needed climbing 3-5 steps with a railing? : A Lot 6 Click Score: 14    End of Session   Activity Tolerance: Patient tolerated treatment well Patient left: in bed;with call bell/phone within reach;with bed alarm set Nurse Communication: Mobility status PT Visit Diagnosis: Other abnormalities of gait and mobility (R26.89);Unsteadiness on feet (R26.81);Difficulty in walking, not elsewhere classified (R26.2);Other symptoms and signs involving the nervous system (R29.898)     Time: 7741-4239 PT Time Calculation (min) (ACUTE ONLY): 19 min  Charges:  $Therapeutic Exercise: 8-22 mins             Ramond Dial 09/05/2020, 7:44 PM  Mee Hives, PT MS Acute Rehab Dept. Number: [place] and [place]

## 2020-09-05 NOTE — Care Plan (Signed)
Pt family has close in her closet for discharge

## 2020-09-06 LAB — GLUCOSE, CAPILLARY
Glucose-Capillary: 106 mg/dL — ABNORMAL HIGH (ref 70–99)
Glucose-Capillary: 116 mg/dL — ABNORMAL HIGH (ref 70–99)
Glucose-Capillary: 117 mg/dL — ABNORMAL HIGH (ref 70–99)
Glucose-Capillary: 117 mg/dL — ABNORMAL HIGH (ref 70–99)
Glucose-Capillary: 131 mg/dL — ABNORMAL HIGH (ref 70–99)
Glucose-Capillary: 160 mg/dL — ABNORMAL HIGH (ref 70–99)

## 2020-09-06 MED ORDER — MAGNESIUM CITRATE PO SOLN
1.0000 | Freq: Once | ORAL | Status: AC
  Administered 2020-09-06: 1

## 2020-09-06 MED ORDER — GLYCERIN (LAXATIVE) 2 G RE SUPP
1.0000 | Freq: Once | RECTAL | Status: DC
  Filled 2020-09-06: qty 1

## 2020-09-06 MED ORDER — MAGNESIUM CITRATE PO SOLN
1.0000 | Freq: Once | ORAL | Status: DC
  Filled 2020-09-06: qty 296

## 2020-09-06 NOTE — Progress Notes (Signed)
Family Medicine Teaching Service Daily Progress Note Intern Pager: [phone]  Patient name: Stacy Moran Medical record number: [id number] Date of birth: September 03, 1955 Age: 65 y.o. Gender: female  Primary Care Provider: [name], MD Consultants: Palliative Code Status: DNR  Pt Overview and Major Events to Date:  5/31 - admitted 6/8 - DNR per palliative discussion Patient requires sitter for fall risk, will be able to discontinue close to SNF placement time.   Assessment and Plan: Stacy Moran is a 65 y.o. female that presented after fall now awaiting SNF placement. 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   Recent fall with unsteady gait  Agitation Patient in restraints for safety, has moved to a room closer to the nursing station. Daughter aware of plan for visit overnight prior to discharge. - Fall and delirium precautions - Continue clonazepam and seroquel - PT/OT following - 1:1 sitter, Posey belt and mitts PRN for safety, prefer redirecting and avoiding restraints as able   Constipation  PEG tube Patient still without bowel movement and reassuringly without pain. We will put in for mag citrate followed by a suppository. If no bowel movement after those we will do a DRE and KUB if necessary - Miralax twice daily - Senna daily - Mag citrate - Consider suppository     FEN/GI: NPO, PEG tube osmolite PPx: Subcutaneous heparin     Status is: Inpatient  Remains inpatient appropriate because:Unsafe d/c plan  Dispo: The patient is from: Home              Anticipated d/c is to: SNF              Patient currently is not medically stable to d/c. Due to need for sitter and safety restraints, will be able to be removed prior to discharge   Difficult to place patient No    Subjective:  Patient has no current  complaints and she states that her voice is getting a bit stronger/louder. She has not had a poop and is still not having any pain or feeling like she needs to use the restroom  Objective: Temp:  [97.8 F (36.6 C)-98.5 F (36.9 C)] 97.8 F (36.6 C) (06/11 0405) Pulse Rate:  [74-93] 80 (06/11 0831) Resp:  [13-19] 16 (06/11 0831) BP: (101-139)/(72-82) 134/78 (06/11 0801) SpO2:  [95 %-100 %] 98 % (06/11 0831) FiO2 (%):  [21 %] 21 % (06/11 0400) Physical Exam: General: NAD, supine in bed, appears comfortable Cardiovascular: RRR, no murmur appreciated Respiratory: breathing comfortably on room air, no increased WOB Abdomen: non-tender, non-distended, still overall soft but able to palpate stool, no rebound tenderness Extremities: moving all equally and appropriately, posey belt and hand mitts in place  Laboratory: Recent Labs  Lab 08/31/20 0134 09/01/20 0942  WBC 6.2 7.3  HGB 10.9* 11.5*  HCT 35.2* 36.8  PLT 172 194   Recent Labs  Lab 08/31/20 0134 09/01/20 0942  NA 139 138  K 4.3 4.3  CL 103 103  CO2 30 27  BUN 20 23  CREATININE 0.54 0.57  CALCIUM 9.6 9.9  GLUCOSE 110* 110*      Imaging/Diagnostic Tests: No results found.   Rise Patience, DO 09/06/2020, 9:19 AM PGY-1, [place] Intern pager: [phone], text pages welcome

## 2020-09-06 NOTE — Progress Notes (Addendum)
Family Medicine Teaching Service Daily Progress Note Intern Pager: [phone]  Patient name: Stacy Moran Medical record number: [id number] Date of birth: 18-Apr-1955 Age: 65 y.o. Gender: female  Primary Care Provider: [name], MD Consultants: Palliative Code Status: DNR  Pt Overview and Major Events to Date:  5/31 - admitted 6/8 - DNR per palliative discussion Patient requires sitter for fall risk, will be able to discontinue close to SNF placement time.    Assessment and Plan:  Stacy Moran is a 65 y.o. female that presented after fall now awaiting SNF placement. 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  Recent fall with unsteady gait  Agitation No acute concerns overnight. Pt required soft restraints and 1:1 sitter overnight due to agitation. - Fall and delirium precautions - Continue clonazepam and seroquel - PT/OT following - 1:1 sitter, Posey belt and mitts PRN for safety, prefer redirecting and avoiding restraints as able   Constipation  PEG tube Pt had 2 x Bms yesterday per RN  -Monitor BMs - Miralax twice daily - Senna daily -Consider suppository    FEN/GI: NPO, PEG tube osmolite PPx: subcutaneous heparin   Status is: Inpatient  Remains inpatient appropriate because:Unsafe d/c plan  Dispo: The patient is from: Home              Anticipated d/c is to: SNF              Patient currently is not medically stable to d/c.   Difficult to place patient No  Subjective:  No acute concerns overnight  Objective: Temp:  [97.7 F (36.5 C)-98.2 F (36.8 C)] 97.7 F (36.5 C) (06/11 1126) Pulse Rate:  [71-86] 71 (06/11 1938) Resp:  [13-19] 18 (06/11 1938) BP: (118-134)/(74-80) 119/78 (06/11 1126) SpO2:  [96 %-100 %] 96 % (06/11 1938) FiO2 (%):  [21 %] 21 % (06/11 0400)  Physical Exam: General:  Alert, no acute distress, pleasant Cardio: Normal S1 and S2, RRR, no r/m/g Pulm: CTAB, normal work of breathing, trach in place Abdomen: Bowel sounds normal. Abdomen soft and non-tender.  Extremities: No peripheral edema.  Neuro: Cranial nerves grossly intact   Laboratory: Recent Labs  Lab 08/31/20 0134 09/01/20 0942  WBC 6.2 7.3  HGB 10.9* 11.5*  HCT 35.2* 36.8  PLT 172 194   Recent Labs  Lab 08/31/20 0134 09/01/20 0942  NA 139 138  K 4.3 4.3  CL 103 103  CO2 30 27  BUN 20 23  CREATININE 0.54 0.57  CALCIUM 9.6 9.9  GLUCOSE 110* 110*      Imaging/Diagnostic Tests: No results found.   Lattie Haw, MD 09/06/2020, 8:40 PM PGY-2, [place] Intern pager: [phone], text pages welcome

## 2020-09-06 NOTE — Progress Notes (Signed)
FPTS Interim Progress Note  Patient sleeping and resting comfortably.  Rounded with primary RN.  No concerns voiced.  No orders required.  Appreciated nightly round.  Today's Vitals   09/06/20 0131 09/06/20 0200 09/06/20 0300 09/06/20 0400  BP: 132/75   118/74  Pulse: 83 79 86 79  Resp: 16 17 18 14   Temp:      TempSrc:      SpO2: 99% 96% 100% 100%  Weight:      PainSc:        Continue Tele sitter and soft bed belt restraint.  Reassess in am  Carollee Leitz, MD 09/06/2020, 4:40 AM PGY-2, [place] Medicine Service pager [phone]

## 2020-09-07 LAB — GLUCOSE, CAPILLARY
Glucose-Capillary: 108 mg/dL — ABNORMAL HIGH (ref 70–99)
Glucose-Capillary: 119 mg/dL — ABNORMAL HIGH (ref 70–99)
Glucose-Capillary: 120 mg/dL — ABNORMAL HIGH (ref 70–99)
Glucose-Capillary: 120 mg/dL — ABNORMAL HIGH (ref 70–99)
Glucose-Capillary: 122 mg/dL — ABNORMAL HIGH (ref 70–99)
Glucose-Capillary: 189 mg/dL — ABNORMAL HIGH (ref 70–99)

## 2020-09-08 LAB — BASIC METABOLIC PANEL
Anion gap: 5 (ref 5–15)
BUN: 26 mg/dL — ABNORMAL HIGH (ref 8–23)
CO2: 30 mmol/L (ref 22–32)
Calcium: 9.7 mg/dL (ref 8.9–10.3)
Chloride: 103 mmol/L (ref 98–111)
Creatinine, Ser: 0.62 mg/dL (ref 0.44–1.00)
GFR, Estimated: >60 mL/min (ref >60)
Glucose, Bld: 106 mg/dL — ABNORMAL HIGH (ref 70–99)
Potassium: 4.2 mmol/L (ref 3.5–5.1)
Sodium: 138 mmol/L (ref 135–145)

## 2020-09-08 LAB — CBC
HCT: 34.9 % — ABNORMAL LOW (ref 36.0–46.0)
Hemoglobin: 11 g/dL — ABNORMAL LOW (ref 12.0–15.0)
MCH: 26.2 pg (ref 26.0–34.0)
MCHC: 31.5 g/dL (ref 30.0–36.0)
MCV: 83.1 fL (ref 80.0–100.0)
Platelets: 190 10*3/uL (ref 150–400)
RBC: 4.2 MIL/uL (ref 3.87–5.11)
RDW: 15.4 % (ref 11.5–15.5)
WBC: 5.4 10*3/uL (ref 4.0–10.5)
nRBC: 0 % (ref 0.0–0.2)

## 2020-09-08 LAB — GLUCOSE, CAPILLARY
Glucose-Capillary: 180 mg/dL — ABNORMAL HIGH (ref 70–99)
Glucose-Capillary: 116 mg/dL — ABNORMAL HIGH (ref 70–99)
Glucose-Capillary: 174 mg/dL — ABNORMAL HIGH (ref 70–99)
Glucose-Capillary: 105 mg/dL — ABNORMAL HIGH (ref 70–99)

## 2020-09-08 NOTE — TOC Progression Note (Signed)
Transition of Care [facility]) - Progression Note    Patient Details  Name: Emmalea Treanor MRN: [id number] Date of Birth: 1955/11/22  Transition of Care [facility]) CM/SW [place], [place] Phone Number: 09/08/2020, 3:44 PM  Clinical Narrative:   CSW spoke with daughter, [name], to discuss progress in choosing SNF. [name] spoke with representative with [name] over the weekend, and found that the representative was rude and not helpful. Per [name], they are not allowing tours or visitors at all, and they would not be able to keep the patient long term. CSW reviewing chart and [facility] has rescinded their bed offer. [name] still waiting to speak with someone from [facility], but called back later to ask to choose them for SNF. CSW placed call to [facility] to confirm bed availability and ask about them starting insurance authorization process, and waiting on call back from [place]. CSW to follow. Patient continues with sitter and restraints at this time.    Expected Discharge Plan: [place] Barriers to Discharge: Continued Medical Work up  Expected Discharge Plan and Services Expected Discharge Plan: [place] In-house Referral: Clinical Social Work Discharge Planning Services: CM Consult Post Acute Care Choice: [place] arrangements for the past 2 months: Single Family Home                                       Social Determinants of Health (SDOH) Interventions    Readmission Risk Interventions No flowsheet data found.

## 2020-09-08 NOTE — Progress Notes (Signed)
Family Medicine Teaching Service Daily Progress Note Intern Pager: [phone]  Patient name: Stacy Moran Medical record number: [id number] Date of birth: Aug 13, 1955 Age: 65 y.o. Gender: female  Primary Care Provider: [name], MD Consultants: Palliative Code Status: DNR  Pt Overview and Major Events to Date:  08/26/2020: Admitted to [facility] 09/03/2020: Palliative consult, DNR  Assessment and Plan: Stacy Moran is a 65 year old female who presented after fall and now waiting for SNF placement.  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  Patient is medically stable for discharge to SNF  Recent fall with unsteady gait I Agitation No overnight acute events.  Patient did not require any sitter last night. --c/w Fall and delirium precautions --c/w Seoquel --c/w 1:1 sitter or Posey belt and mitts PRN for safety -- Prefer redirecting and avoiding restraints as able  Constipation I s/p PEG tube Patient is unclear about her bowel movements yesterday.  Confirmed with nurse and there is unclear T and advised to document well in flowsheet. --c/w Miralax BID --c/w Senna daily   FEN/GI: NPO, PEG tube osmolite PPx: SubQ Heparin   Status is: Inpatient  Remains inpatient appropriate because:Unsafe d/c plan  Dispo: The patient is from: Home              Anticipated d/c is to: SNF              Patient currently is medically stable to d/c.   Difficult to place patient No        Subjective:  No acute concerns overnight.  Patient denies any complaints this morning.  Objective: Temp:  [98.2 F (36.8 C)-98.9 F (37.2 C)] 98.9 F (37.2 C) (06/13 1112) Pulse Rate:  [77-90] 77 (06/13 1112) Resp:  [14-20] 18 (06/13 1112) BP: (107-134)/(68-89) 134/76 (06/13 1112) SpO2:  [93 %-100 %] 100 % (06/13 1112) FiO2 (%):  [21 %] 21 % (06/13 0753) Weight:  [123 lb 3.8 oz (55.9  kg)] 123 lb 3.8 oz (55.9 kg) (06/13 0500) Physical Exam: General: Pleasant lady lying comfortably in bed, NAD Cardiovascular: RRR,, no m/r/g Respiratory: Trach in place, clear to auscultation bilaterally Abdomen: Soft, nontender, nondistended, bowel sounds present Extremities: No peripheral edema appreciated  Laboratory: Recent Labs  Lab 09/08/20 0857  WBC 5.4  HGB 11.0*  HCT 34.9*  PLT 190   Recent Labs  Lab 09/08/20 0857  NA 138  K 4.2  CL 103  CO2 30  BUN 26*  CREATININE 0.62  CALCIUM 9.7  GLUCOSE 106*     Imaging/Diagnostic Tests: No results found.   Honor Junes, MD 09/08/2020, 12:41 PM PGY-1, [place] Intern pager: [phone], text pages welcome

## 2020-09-08 NOTE — Progress Notes (Signed)
Physical Therapy Treatment Patient Details Name: Stacy Moran MRN: [id number] DOB: June 02, 1955 Today's Date: 09/08/2020    History of Present Illness 65 y/o female presented to ED on 5/31 with generalized waekness and L arm pain/weakness s/p fall down the stairs. 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 Radiographs negative for acute fxs. PMH: SAH s/p PICA aneurysm clipping, bilateral SDH, seizures, chronic tracheostomy, PEG, seizures, HTN, GERD, anxiety    PT Comments    Today's skilled session continued to focus on mobility progression. Pt appears to be more alert/cooperative with therapies in the am as she did not refuse this session. Acute PT to continue during pt's hospital stay.    Follow Up Recommendations  SNF     Equipment Recommendations  None recommended by PT    Precautions / Restrictions Precautions Precautions: Fall Precaution Comments: tele sitter, waist restraint Restrictions Weight Bearing Restrictions: No    Mobility  Bed Mobility Overal bed mobility: Needs Assistance       Supine to sit: Supervision Sit to supine: Supervision   General bed mobility comments: directional cues needed for technique, no physical assistance needed with HOB flat.    Transfers Overall transfer level: Needs assistance Equipment used: Rolling walker (2 wheeled) Transfers: Sit to/from Stand Sit to Stand: Min guard         General transfer comment: cues for hand placement with sit<>stand transfers as pt was trying to pull up on walker. min guard assist for safety.  Ambulation/Gait Ambulation/Gait assistance: Min guard;Min assist Gait Distance (Feet): 30 Feet Assistive device: Rolling walker (2 wheeled) Gait Pattern/deviations: Step-through pattern;Decreased step length - right;Decreased step length - left;Decreased stride length Gait velocity: decreased   General Gait Details: min assist for walker  management with gait. cues on posture and walker position with gait as well.        Cognition Arousal/Alertness: Awake/alert Behavior During Therapy: WFL for tasks assessed/performed Overall Cognitive Status: History of cognitive impairments - at baseline               General Comments: pt cooperative with session today, following commands with slight delay       Pertinent Vitals/Pain Pain Assessment: 0-10 Pain Score: 0-No pain     PT Goals (current goals can now be found in the care plan section) Acute Rehab PT Goals Patient Stated Goal: not stated PT Goal Formulation: Patient unable to participate in goal setting Time For Goal Achievement: 09/10/20 Potential to Achieve Goals: Fair Progress towards PT goals: Progressing toward goals    Frequency    Min 3X/week      PT Plan Current plan remains appropriate    AM-PAC PT "6 Clicks" Mobility   Outcome Measure  Help needed turning from your back to your side while in a flat bed without using bedrails?: A Little Help needed moving from lying on your back to sitting on the side of a flat bed without using bedrails?: A Little Help needed moving to and from a bed to a chair (including a wheelchair)?: A Little Help needed standing up from a chair using your arms (e.g., wheelchair or bedside chair)?: A Little   Help needed climbing 3-5 steps with a railing? : A Lot 6 Click Score: 14    End of Session Equipment Utilized During Treatment: Gait belt Activity Tolerance: Patient tolerated treatment well Patient left: in bed;with call bell/phone within reach;with bed alarm set;with restraints reapplied Nurse Communication: Mobility  status PT Visit Diagnosis: Other abnormalities of gait and mobility (R26.89);Unsteadiness on feet (R26.81);Difficulty in walking, not elsewhere classified (R26.2);Other symptoms and signs involving the nervous system (R29.898)     Time: 6861-6837 PT Time Calculation (min) (ACUTE ONLY): 15  min  Charges:  $Gait Training: 8-22 mins                    Stacy Moran, PTA, [phone] 09/08/20, 11:25 AM    Stacy Moran 09/08/2020, 11:24 AM

## 2020-09-08 NOTE — Progress Notes (Signed)
FPTS Interim Progress Note   Patient sleeping and resting comfortably.  Rounded with primary RN.  No concerns voiced.  Appreciated nightly round.  Today's Vitals   09/07/20 2313 09/08/20 0030 09/08/20 0357 09/08/20 0418  BP:  107/72 113/68 115/70  Pulse: 86 84 85 80  Resp: 18 18 14 20   Temp:      TempSrc:      SpO2: 95% 98% 100% 100%  Weight:      PainSc:       Oncologist reordered [employer] restraint reordered  Will have day team reach out to CSW so they may speak with daughter regarding SNF decision.  Once this is done then team can discus with daughter when she will be able to spend a night to see if patient can be without restraints at least 24 hrs prior to SNF placement.  Carollee Leitz, MD 09/08/2020, 4:59 AM PGY-2, [place] [facility] Service pager [phone]

## 2020-09-09 NOTE — Progress Notes (Signed)
FPTS Interim Progress Note  Patient sleeping and resting comfortably.  Rounded with primary RN.  No concerns voiced.  No orders required.  Appreciated nightly round.  Today's Vitals   09/08/20 2343 09/09/20 0000 09/09/20 0348 09/09/20 0407  BP: 108/69  105/60 110/63  Pulse: 78 77 77   Resp: 18 19 17    Temp: 98.2 F (36.8 C)   98.4 F (36.9 C)  TempSrc: Axillary Axillary  Axillary  SpO2: 97%  100%   Weight:      PainSc:       Oncologist and soft bed belt restraint reordered  Carollee Leitz, MD 09/09/2020, 4:49 AM PGY-2, [facility] Service pager [phone]

## 2020-09-09 NOTE — Progress Notes (Signed)
Occupational Therapy Treatment Patient Details Name: Stacy Moran MRN: [id number] DOB: May 31, 1955 Today's Date: 09/09/2020    History of present illness 65 y/o female presented to ED on 5/31 with generalized waekness and L arm pain/weakness s/p fall down the stairs. 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 Radiographs negative for acute fxs. PMH: SAH s/p PICA aneurysm clipping, bilateral SDH, seizures, chronic tracheostomy, PEG, seizures, HTN, GERD, anxiety   OT comments  Pt making steady progress towards OT goals this session. Pt received seated in bed reporting she was "bored" and agreeable to OT intervention. Pt continues to present with mild impulsivity and impaired balance. Pt currently requires min guard for standing grooming tasks, MIN A for UB ADLs and set- up - supervision for LB ADls from long sitting in bed. Pt required [facility]  for functional mobility with RW. Pt required cues for safety and attention to task throughout session with pt easily distracted by lines and leads and extraneous items at sink, pt AxOx4 and following all commands with slight delay. Pt would continue to benefit from skilled occupational therapy while admitted and after d/c to address the below listed limitations in order to improve overall functional mobility and facilitate independence with BADL participation. DC plan remains appropriate, will follow acutely per POC.     Follow Up Recommendations  SNF    Equipment Recommendations  None recommended by OT    Recommendations for Other Services      Precautions / Restrictions Precautions Precautions: Fall Precaution Comments: tele sitter, posey belt Restrictions Weight Bearing Restrictions: No       Mobility Bed Mobility Overal bed mobility: Needs Assistance Bed Mobility: Supine to Sit     Supine to sit: Min guard     General bed mobility comments: min guard for safety and line  mgmt    Transfers Overall transfer level: Needs assistance Equipment used: Rolling walker (2 wheeled) Transfers: Sit to/from Stand Sit to Stand: Min guard         General transfer comment: min guard for safety and line mgmt to complete multiple sit<>stands during session    Balance Overall balance assessment: Needs assistance Sitting-balance support: Feet supported Sitting balance-Leahy Scale: Fair     Standing balance support: No upper extremity supported;During functional activity Standing balance-Leahy Scale: Fair Standing balance comment: close min guard for safety when completing ADLs at sink                           ADL either performed or assessed with clinical judgement   ADL Overall ADL's : Needs assistance/impaired     Grooming: Wash/dry face;Standing;Min guard;Cueing for safety;Cueing for sequencing Grooming Details (indicate cue type and reason): cues to initiate task and for sequencing such as turning off water; min guard for standing balance, pt impulsively rummaging through nursing items at sink         Upper Body Dressing : Minimal assistance;Sitting Upper Body Dressing Details (indicate cue type and reason): to don posterior gown Lower Body Dressing: Supervision/safety;Set up Lower Body Dressing Details (indicate cue type and reason): pt able to don socks with set- up assist from long sitting Toilet Transfer: Minimal assistance;RW;Ambulation;Cueing for safety Toilet Transfer Details (indicate cue type and reason): simulated via functional mobility, cues for safety awareness and attention to task as pt distracted by lines and leads         Functional mobility during  ADLs: Minimal assistance;Rolling walker;Cueing for safety General ADL Comments: pt continues to present with mild impulsivity and decresaed safety awareness     Vision       Perception     Praxis      Cognition Arousal/Alertness: Awake/alert Behavior During Therapy:  [facility] for tasks assessed/performed;Impulsive (mild impulsive, able to redirect) Overall Cognitive Status: History of cognitive impairments - at baseline                                 General Comments: pt cooperative with session today, following commands with slight delay        Exercises     Shoulder Instructions       General Comments pt on RA during session with PMV in place, VSS    Pertinent Vitals/ Pain       Pain Assessment: No/denies pain  Home Living                                          Prior Functioning/Environment              Frequency  Min 1X/week        Progress Toward Goals  OT Goals(current goals can now be found in the care plan section)  Progress towards OT goals: Progressing toward goals  Acute Rehab OT Goals Patient Stated Goal: none stated Time For Goal Achievement: 09/10/20 Potential to Achieve Goals: [place] Discharge plan remains appropriate;Frequency remains appropriate    Co-evaluation                 AM-PAC OT "6 Clicks" Daily Activity     Outcome Measure   Help from another person eating meals?:  (NPO) Help from another person taking care of personal grooming?: A Little Help from another person toileting, which includes using toliet, bedpan, or urinal?: A Little Help from another person bathing (including washing, rinsing, drying)?: A Little Help from another person to put on and taking off regular upper body clothing?: None Help from another person to put on and taking off regular lower body clothing?: None 6 Click Score: 17    End of Session Equipment Utilized During Treatment: Rolling walker  OT Visit Diagnosis: Unsteadiness on feet (R26.81);History of falling (Z91.81);Other symptoms and signs involving cognitive function   Activity Tolerance Patient tolerated treatment well   Patient Left in chair;with chair alarm set;with restraints reapplied   Nurse Communication  Mobility status        Time: 1034-1100 OT Time Calculation (min): 26 min  Charges: OT General Charges $OT Visit: 1 Visit OT Treatments $Self Care/Home Management : 23-37 mins  Harley Alto., COTA/L Acute Rehabilitation Services [place]    Precious Haws 09/09/2020, 11:54 AM

## 2020-09-09 NOTE — Progress Notes (Signed)
Family Medicine Teaching Service Daily Progress Note Intern Pager: [phone]  Patient name: Stacy Moran Medical record number: [id number] Date of birth: 07/05/1955 Age: 65 y.o. Gender: female  Primary Care Provider: [name], MD Consultants: Palliative Code Status: DNR  Pt Overview and Major Events to Date:  08/26/2020: Admitted to [facility] 09/03/2020: Palliative consult, DNR  Assessment and Plan: Stacy Moran is a 65 year old female who presented after a fall and now awaiting SNF placement.  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  Patient is medically stable for discharge to SNF  Recent fall with unsteady gait I agitation Telemetry sitter and soft belt restraint reordered last night. --c/w Delirium and fall precautions --c/w Seroquel --c/w sitter or osey belt and mitts PRN for safety --Prefer redirecting and avoiding restraints as able  Constipation I s/p PEG tube No bowel movement yesterday. --c/w Miralax --c/w Senna daily   FEN/GI: NPO PPx: Sub Q Heparin   Status is: Inpatient  Remains inpatient appropriate because:Hemodynamically unstable  Dispo: The patient is from:  Homeless              Anticipated d/c is to: SNF              Patient currently is medically stable to d/c.   Difficult to place patient No        Subjective:  No acute overnight events. Denies any complaints this morning just concerned about her placement.  Objective: Temp:  [98.2 F (36.8 C)-98.9 F (37.2 C)] 98.3 F (36.8 C) (06/14 0748) Pulse Rate:  [76-96] 90 (06/14 0806) Resp:  [14-19] 17 (06/14 0806) BP: (105-142)/(53-80) 142/78 (06/14 0748) SpO2:  [95 %-100 %] 100 % (06/14 0806) FiO2 (%):  [21 %] 21 % (06/14 0348) Weight:  [124 lb 9 oz (56.5 kg)] 124 lb 9 oz (56.5 kg) (06/14 0500) Physical Exam: General: Pleasant lady lying comfortably in bed,  NAD Cardiovascular: RRR Respiratory: Trach in place, clear to auscultation Abdomen: Soft, nontender, nondistended, bowel sounds+ Extremities: No peripheral edema  Laboratory: Recent Labs  Lab 09/08/20 0857  WBC 5.4  HGB 11.0*  HCT 34.9*  PLT 190   Recent Labs  Lab 09/08/20 0857  NA 138  K 4.2  CL 103  CO2 30  BUN 26*  CREATININE 0.62  CALCIUM 9.7  GLUCOSE 106*     Imaging/Diagnostic Tests: No results found.   Honor Junes, MD 09/09/2020, 8:47 AM PGY-1, [place] Intern pager: [phone], text pages welcome

## 2020-09-09 NOTE — Progress Notes (Signed)
  Speech Language Pathology Treatment: Dysphagia  Patient Details Name: Stacy Moran MRN: [id number] DOB: [birth year] Today's Date: 09/09/2020 Time: 5093-2671 SLP Time Calculation (min) (ACUTE ONLY): 14 min  Assessment / Plan / Recommendation Clinical Impression  Pt fully alert today, awareness, reasoning severely impaired. Pt given three teaspoons of nectar thick unsweet tea, which she hated. These trials, with PMSV in place resulted in multiple swallow, wet vocal quality, prolonged throat clearing. Initial MBS this admission was done while pt still quite lethargic and PMSV not in use. Though these were barriers to success with swallowing, the main problem remains pts very altered pharyngeal/laryngeal anatomy with need for f/u with ENT. Will proceed acutely with FEES for direct visualization of airway for clearing understanding pts anatomical differences and how they impact swallowing. Tried to contact pts to daughter to discuss, but no response.    HPI HPI: Pt is a 65 year old woman with who presented with acute worsening of subdural hematomas after fall. MRI brain 5/31: "Unchanged bilateral subdural hematomas and trace rightward midline shift." Neurosurgery considering increasing the setting on her shunt as of 5/31. Pt d/c from CIR NPO with PEG has not comlpeted any SLP therapy or swallow evaluations since 05/20/20. At that time pt still not tolearting PMSV well. 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  MBS 02/19/20 profound dysphagia with abnormal anatomy. Repeat MBS 02/26/21 minimal improvement- decr edema however significant subglottic edema/stenosis narrowing subglottic space. Bolus stops at UES with minimal opening with aspiration during/post swalow- continue NPO. MBS 04/09/20: severe pharyngoesophageal dysphagia with minimal improvements. subglottic  edema was decreased but UES function continued to be significantly impaired in addition to tongue base retraction and pharyngeal contraction all contributing to pharyngeal stasis. Pt was also seen ENT. On 02/29/20 Dr Constance Holster performed laryngoscopy reporting "The cords were thickened and swollen.  There is significant subglottic swelling especially on the left side and posteriorly.  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  As the anesthesia was wearing off the cords were inspected.  Deep suctioning through the tracheostomy was used to elicit a cough.  There definitely seem to be movement of the right cord.  The left cord may be paretic but it is a little hard to tell... Over the next several months we can continue evaluation to see how the subglottic airway grasses.  She may need future procedures to open the subglottis if necessary"      SLP Plan  Continue with current plan of care;Other (Comment) (FEES)       Recommendations  Diet recommendations: NPO      Patient may use Passy-Muir Speech Valve: During all waking hours (remove during sleep)         Oral Care Recommendations: Oral care QID Follow up Recommendations: Skilled Nursing facility Plan: Continue with current plan of care;Other (Comment) (FEES)       GO                Stacy Moran, Stacy Moran 09/09/2020, 10:32 AM

## 2020-09-10 DIAGNOSIS — S065X9A Traumatic subdural hemorrhage with loss of consciousness of unspecified duration, initial encounter: Secondary | ICD-10-CM | POA: Diagnosis not present

## 2020-09-10 DIAGNOSIS — Z982 Presence of cerebrospinal fluid drainage device: Secondary | ICD-10-CM | POA: Diagnosis not present

## 2020-09-10 DIAGNOSIS — I69891 Dysphagia following other cerebrovascular disease: Secondary | ICD-10-CM | POA: Diagnosis not present

## 2020-09-10 DIAGNOSIS — Z930 Tracheostomy status: Secondary | ICD-10-CM | POA: Diagnosis not present

## 2020-09-10 NOTE — Progress Notes (Signed)
Nutrition Follow-up  DOCUMENTATION CODES:  Not applicable  INTERVENTION:   -Continue 355 ml Osmolite 1.2 QID via PEG -Continue 45 ml Prosource TF BID via PEG -Continue 200 ml free water every 6 hours via PEG  Complete regimen provides 1784 kcals, 100 grams protein, and 1970 ml free water daily, meeting 100% of estimated kcal and protein needs  NUTRITION DIAGNOSIS:  Inadequate oral intake related to inability to eat as evidenced by NPO status. -- ongoing  GOAL:  Patient will meet greater than or equal to 90% of their needs -- met with TF  MONITOR:  Labs, Weight trends, TF tolerance, Skin, I & O's  REASON FOR ASSESSMENT:  Other (Comment)    ASSESSMENT:  65 year old woman with recent (winter 2021) ruptured PICA anuerysm resulting in [facility] with prolonged hospital stay requiring tracheostomy placement, thyroidectomy (for tracheostomy), and PEG tube placement presenting with acute worsening of her subdural hematomas after a fall today. She complains of residual reduction in bilateral grip strength. Daughter [name], works at [employer]) provides additional history. The patient has poor sleep and becomes agitated and confused at night at times. This morning, despite multiple home measures include alarms and lights, the patient fell down the stairs. Denies headaches, chest pain, abdomina pain, endorses only hand pain.  Pt admitted with acute on chronic SDH. Now pending discharge to SNF.   Pt remains on TC and is tolerating TF via PEG per RN. Current regimen: 355 ml Osmolite 1.2 QID, 45 ml Prosource Plus BID, and 200 ml free water flush 4 times daily. Complete regimen provides 1784 kcals, 100 grams protein, and 1964 ml free water daily, meeting 100% of estimated kcal and protein needs. Underwent FEES today with SLP recommending pt remain NPO. Pt w/o complaints at this time and is medically ready for discharge to SNF per MD.   UOP: 1153m x24 hours  Admit wt: 55.5 kg Current wt: 58.1 kg    Medications:  amLODipine  10 mg Per Tube Daily   chlorhexidine  15 mL Mouth Rinse BID   Chlorhexidine Gluconate Cloth  6 each Topical Daily   feeding supplement (OSMOLITE 1.2 CAL)  355 mL Per Tube TID WC & HS   feeding supplement (PROSource TF)  45 mL Per Tube BID   free water  200 mL Per Tube Q6H   heparin  5,000 Units Subcutaneous Q8H   hydrALAZINE  50 mg Per Tube Q8H   labetalol  300 mg Per Tube TID   levETIRAcetam  750 mg Per Tube BID   levothyroxine  100 mcg Per Tube Q0600   mouth rinse  15 mL Mouth Rinse q12n4p   polyethylene glycol  17 g Per Tube BID   QUEtiapine  50 mg Per Tube BID   senna  1 tablet Per Tube Daily   sodium chloride flush  3 mL Intravenous Once   Labs: Recent Labs  Lab 09/08/20 0857  NA 138  K 4.2  CL 103  CO2 30  BUN 26*  CREATININE 0.62  CALCIUM 9.7  GLUCOSE 106*  CBGs 116-174-105  Diet Order:   Diet Order             Diet NPO time specified  Diet effective now                  EDUCATION NEEDS:  Not appropriate for education at this time  Skin:  Skin Assessment: Reviewed RN Assessment  Last BM:  6/12 per pt  Height:  Ht Readings from Last 1 Encounters:  07/29/20 5' 6"  (1.676 m)   Weight:  Wt Readings from Last 1 Encounters:  09/10/20 58.1 kg   Ideal Body Weight:  59.1 kg  BMI:  Body mass index is 20.67 kg/m.  Estimated Nutritional Needs:  Kcal:  1694-5038 Protein:  85-100 grams Fluid:  > 1.7 L   Larkin Ina, MS, RD, LDN RD pager number and weekend/on-call pager number located in [place].

## 2020-09-10 NOTE — Progress Notes (Signed)
Patient's blood pressures have been in the low 100s/70s, was due to get Labetalol and hydralazine. Spoke with nurse to hold medications and repeat in 1 hour. BP was largely unchanged, will hold hydralazine and Labetalol doses tonight and continue to monitor BP.   Mimie Goering, DO

## 2020-09-10 NOTE — Progress Notes (Signed)
FPTS Interim Progress Note  Patient sleeping and resting comfortably.  Rounded with primary RN.  No concerns voiced.  No orders required.  Appreciated nightly round.  Today's Vitals   09/09/20 2310 09/09/20 2355 09/10/20 0408 09/10/20 0500  BP:  108/67 121/78   Pulse:  78 73   Resp:  18 15   Temp:  98.5 F (36.9 C) 98.4 F (36.9 C)   TempSrc:  Oral Oral   SpO2:  100% 96%   Weight:    58.1 kg  PainSc: 0-No pain      Tele sitter Discontinue belt restraint, reassess in am   Carollee Leitz, MD 09/10/2020, 7:03 AM PGY-2, [place] Service pager [phone]

## 2020-09-10 NOTE — Progress Notes (Signed)
Family Medicine Teaching Service Daily Progress Note Intern Pager: [phone]  Patient name: Stacy Moran Medical record number: [id number] Date of birth: 1956-01-05 Age: 65 y.o. Gender: female  Primary Care Provider: [name], MD Consultants: Pallaitive Code Status: DNR  Pt Overview and Major Events to Date:  08/26/2020: Admitted to [facility] 09/03/2020: palliative care, DNR now  Assessment and Plan: Stacy Moran is a 65 year old female who presented after a fall and now awaiting SNF placement.  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  Patient is medically stable for discharge to SNF  Recent fall with Unsteady gait I Agitation Patient restraints were discontinued at 5 AM this morning and plan is to continue decreasing the duration if possible. --c/w delirium and fall precautions --c/w Seoquel --c/w with sitter or restraints as needed -- Prefer redirecting and avoiding restraints as able  Constipation I s/p PEG tube No bowel movement yesterday. --c/w Miralax --c/w Senna daily --RN informed to see to patient's compliance with above meds   SNF placement SW confirmed yesterday that daughter has decided on [place] and SW is working to get bed availability and Ship broker. --Appreciate SW assistence --Preference for SNF are memory units  FEN/GI: NPO PPx: Sub Q Heparin   Status is: Inpatient  Remains inpatient appropriate because:Unsafe d/c plan  Dispo: The patient is from: Home              Anticipated d/c is to: SNF              Patient currently is medically stable to d/c.   Difficult to place patient No        Subjective:  Patient's restraints discontinued at 5 this am. State feel constipated this am.  Objective: Temp:  [97.7 F (36.5 C)-98.5 F (36.9 C)] 97.9 F (36.6 C) (06/15 0830) Pulse Rate:  [73-101] 78 (06/15  0757) Resp:  [14-18] 14 (06/15 0757) BP: (100-150)/(61-89) 121/78 (06/15 0408) SpO2:  [96 %-100 %] 98 % (06/15 0830) Weight:  [128 lb 1.4 oz (58.1 kg)] 128 lb 1.4 oz (58.1 kg) (06/15 0500) Physical Exam: General: Pleasant elderly lady lying on left side, NAD Cardiovascular: RRR Respiratory: Clear to auscultation, Trach in place Abdomen: Soft, non-tender, non-distended, BS+ Extremities: No peripheral edema appreciated  Laboratory: Recent Labs  Lab 09/08/20 0857  WBC 5.4  HGB 11.0*  HCT 34.9*  PLT 190   Recent Labs  Lab 09/08/20 0857  NA 138  K 4.2  CL 103  CO2 30  BUN 26*  CREATININE 0.62  CALCIUM 9.7  GLUCOSE 106*     Imaging/Diagnostic Tests: No results found.   Honor Junes, MD 09/10/2020, 9:32 AM PGY-1, [place] Intern pager: [phone], text pages welcome

## 2020-09-10 NOTE — Procedures (Signed)
Objective Swallowing Evaluation: Type of Study: FEES-Fiberoptic Endoscopic Evaluation of Swallow   Patient Details  Name: Stacy Moran MRN: [id number] Date of Birth: 1955-05-17  Today's Date: 09/10/2020 Time: SLP Start Time (ACUTE ONLY): 1015 -SLP Stop Time (ACUTE ONLY): 1035  SLP Time Calculation (min) (ACUTE ONLY): 20 min   Past Medical History:  Past Medical History:  Diagnosis Date   Prosthetic eye globe    Ruptured aneurysm of artery (HCC)    Status post insertion of percutaneous endoscopic gastrostomy (PEG) tube ([place])    Subdural hematoma ([place])    Tracheostomy dependent ([place])    Past Surgical History:  Past Surgical History:  Procedure Laterality Date   BURR HOLE N/A 03/18/2020   Procedure: Haskell Flirt;  Surgeon: [name], MD;  Location: [facility];  Service: Neurosurgery;  Laterality: N/A;   CRANIOTOMY Left 01/30/2020   Procedure: LEFT FAR LATERAL CRANIOTOMY FOR ANEURYSM CLIPPING;  Surgeon: [name], MD;  Location: [facility];  Service: Neurosurgery;  Laterality: Left;   DIRECT LARYNGOSCOPY N/A 02/29/2020   Procedure: DIRECT LARYNGOSCOPY;  Surgeon: [name], MD;  Location: [place];  Service: ENT;  Laterality: N/A;   IR ANGIO INTRA EXTRACRAN SEL INTERNAL CAROTID BILAT MOD SED  01/30/2020   IR ANGIO VERTEBRAL SEL VERTEBRAL UNI L MOD SED  01/30/2020   IR CM INJ ANY COLONIC TUBE W/FLUORO  07/03/2020   IR GASTROSTOMY TUBE MOD SED  02/22/2020   IR REPLC GASTRO/COLONIC TUBE PERCUT W/FLUORO  07/03/2020   LAPAROSCOPIC REVISION VENTRICULAR-PERITONEAL (V-P) SHUNT N/A 03/18/2020   Procedure: LAPAROSCOPIC REVISION VENTRICULAR-PERITONEAL (V-P) SHUNT;  Surgeon: [name], MD;  Location: [facility] [facility];  Service: General;  Laterality: N/A;   RADIOLOGY WITH ANESTHESIA N/A 01/30/2020   Procedure: IR WITH ANESTHESIA;  Surgeon: [name], MD;  Location: [place];  Service: Radiology;  Laterality: N/A;   THYROIDECTOMY N/A 02/08/2020   Procedure: THYROIDECTOMY;  Surgeon: [name],  MD;  Location: [facility];  Service: ENT;  Laterality: N/A;   TRACHEOSTOMY TUBE PLACEMENT N/A 02/08/2020   Procedure: TRACHEOSTOMY;  Surgeon: [name], MD;  Location: [place];  Service: ENT;  Laterality: N/A;   TRACHEOSTOMY TUBE PLACEMENT N/A 02/29/2020   Procedure: TRACHEOSTOMY EXCHANGE;  Surgeon: [name], MD;  Location: [facility];  Service: ENT;  Laterality: N/A;   VENTRICULOPERITONEAL SHUNT N/A 03/18/2020   Procedure: RIGHT SHUNT INSERTION VENTRICULAR-PERITONEAL/ BURR HOLE Evacuation of Subdural Hematoma;  Surgeon: [name], MD;  Location: [facility];  Service: Neurosurgery;  Laterality: N/A;   HPI: Pt is a 65 year old woman with who presented with acute worsening of subdural hematomas after fall. MRI brain 5/31: "Unchanged bilateral subdural hematomas and trace rightward midline shift." Neurosurgery considering increasing the setting on her shunt as of 5/31. Pt d/c from CIR NPO with PEG has not comlpeted any SLP therapy or swallow evaluations since 05/20/20. At that time pt still not tolearting PMSV well. 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  MBS 02/19/20 profound dysphagia with abnormal anatomy. Repeat MBS 02/26/21 minimal improvement- decr edema however significant subglottic edema/stenosis narrowing subglottic space. Bolus stops at UES with minimal opening with aspiration during/post swalow- continue NPO. MBS 04/09/20: severe pharyngoesophageal dysphagia with minimal improvements. subglottic edema was decreased but UES function continued to be significantly impaired in addition to tongue base retraction and pharyngeal contraction all contributing to pharyngeal stasis. Pt was also seen ENT.  On 02/29/20 Dr Constance Holster performed laryngoscopy reporting "The cords were thickened and swollen.  There is significant subglottic swelling especially on the left side and  posteriorly.  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  As the anesthesia was wearing off the cords were inspected.  Deep suctioning through the tracheostomy was used to elicit a cough.  There definitely seem to be movement of the right cord.  The left cord may be paretic but it is a little hard to tell... Over the next several months we can continue evaluation to see how the subglottic airway grasses.  She may need future procedures to open the subglottis if necessary"   No data recorded   Assessment / Plan / Recommendation  CHL IP CLINICAL IMPRESSIONS 09/10/2020  Clinical Impression Though fully alert and participatory today with PMSV in place, pt continues to demonstrate profound dyshpagia. Pharyngeal and laryngeal tissue appears pink and healthy, all structures visible, though there is moderate edema of the aryepiglottic folds. Vocal folds visible and white. Pts arytenoids appear paralyzed in a semiadducted position with minimal glottic opening, but enough for airflow to upper airway for phonation and cough. Unable to view the subglottis. No abduction with deep insipiration. There are severe standing secretions, Secretion Severity scale 3 - standing secretion on glottis. Pt had prolonged coughing and clearing efforts after scope arrival to pharynx but secretion never cleared. Pt attempted dry swallows with no epiglottic deflection, only minimal laryngeal elevation, no appreciable anterior movement of larynx. Pt was given one teaspoon of honey thick liquids which mixed with secretions and were grossly aspirated during pts inspiration for cued coughing efforts. Sensation significant impaired likely given persistently pooled secretions in airway. Swallow mechanism remains insufficient for PO intake, pt needs f/u with ENT to determine if any interventions may benefit laryngeal mobility. Discussed with pts daughter via telephone.  SLP Visit Diagnosis --  Attention and concentration deficit following --   Frontal lobe and executive function deficit following --  Impact on safety and function --      CHL IP TREATMENT RECOMMENDATION 08/28/2020  Treatment Recommendations Therapy as outlined in treatment plan below     Prognosis 09/10/2020  Prognosis for Safe Diet Advancement Guarded  Barriers to Reach Goals Severity of deficits  Barriers/Prognosis Comment --    CHL IP DIET RECOMMENDATION 09/10/2020  SLP Diet Recommendations NPO  Liquid Administration via --  Medication Administration --  Compensations --  Postural Changes --      CHL IP OTHER RECOMMENDATIONS 09/10/2020  Recommended Consults Consider ENT evaluation  Oral Care Recommendations --  Other Recommendations --      CHL IP FOLLOW UP RECOMMENDATIONS 09/10/2020  Follow up Recommendations Skilled Nursing facility      [facility] IP FREQUENCY AND DURATION 08/28/2020  Speech Therapy Frequency (ACUTE ONLY) min 2x/week  Treatment Duration 2 weeks           CHL IP ORAL PHASE 09/10/2020  Oral Phase WFL  Oral - Pudding Teaspoon --  Oral - Pudding Cup --  Oral - Honey Teaspoon --  Oral - Honey Cup --  Oral - Nectar Teaspoon --  Oral - Nectar Cup --  Oral - Nectar Straw --  Oral - Thin Teaspoon --  Oral - Thin Cup --  Oral - Thin Straw --  Oral - Puree --  Oral - Mech Soft --  Oral - Regular --  Oral - Multi-Consistency --  Oral - Pill --  Oral Phase - Comment --  CHL IP PHARYNGEAL PHASE 09/10/2020  Pharyngeal Phase Impaired  Pharyngeal- Pudding Teaspoon --  Pharyngeal --  Pharyngeal- Pudding Cup --  Pharyngeal --  Pharyngeal- Honey Teaspoon Reduced anterior laryngeal mobility;Reduced laryngeal elevation;Reduced epiglottic inversion;Reduced pharyngeal peristalsis;Reduced airway/laryngeal closure;Penetration/Aspiration before swallow;Penetration/Aspiration during swallow;Penetration/Apiration after swallow;Significant aspiration (Amount);Inter-arytenoid space residue;Lateral channel residue  Pharyngeal --  Pharyngeal- Honey  Cup --  Pharyngeal --  Pharyngeal- Nectar Teaspoon --  Pharyngeal --  Pharyngeal- Nectar Cup --  Pharyngeal --  Pharyngeal- Nectar Straw --  Pharyngeal --  Pharyngeal- Thin Teaspoon --  Pharyngeal --  Pharyngeal- Thin Cup --  Pharyngeal --  Pharyngeal- Thin Straw --  Pharyngeal --  Pharyngeal- Puree --  Pharyngeal --  Pharyngeal- Mechanical Soft --  Pharyngeal --  Pharyngeal- Regular --  Pharyngeal --  Pharyngeal- Multi-consistency --  Pharyngeal --  Pharyngeal- Pill --  Pharyngeal --  Pharyngeal Comment --     CHL IP CERVICAL ESOPHAGEAL PHASE 04/09/2020  Cervical Esophageal Phase Impaired  Pudding Teaspoon --  Pudding Cup --  Honey Teaspoon --  Honey Cup --  Nectar Teaspoon --  Nectar Cup --  Nectar Straw --  Thin Teaspoon --  Thin Cup --  Thin Straw --  Puree --  Mechanical Soft --  Regular --  Multi-consistency --  Pill --  Cervical Esophageal Comment --     [name] 09/10/2020, 11:21 AM

## 2020-09-10 NOTE — TOC Progression Note (Signed)
Transition of Care [facility]) - Progression Note    Patient Details  Name: Stacy Moran MRN: [id number] Date of Birth: Jan 21, 1956  Transition of Care [facility]) CM/SW [place],  Phone Number: 09/10/2020, 2:04 PM  Clinical Narrative:   CSW attempted to contact [facility] again today, left messages. CSW received call back from [place], main admissions liaison is on vacation and they are trying to figure things out in his absence, unsure of what is happening. Dee asked CSW to call back tomorrow and she'll know more about what's going on. CSW to continue to follow. Patient has no other bed offers at this time.    Expected Discharge Plan: [place] Barriers to Discharge: Continued Medical Work up  Expected Discharge Plan and Services Expected Discharge Plan: [place] In-house Referral: Clinical Social Work Discharge Planning Services: CM Consult Post Acute Care Choice: [place] arrangements for the past 2 months: Single Family Home                                       Social Determinants of Health (SDOH) Interventions    Readmission Risk Interventions No flowsheet data found.

## 2020-09-10 NOTE — Progress Notes (Signed)
Removed patients PMV speaking valve for night rest.  RN aware.

## 2020-09-10 NOTE — Progress Notes (Signed)
Physical Therapy Treatment Patient Details Name: Stacy Moran MRN: [id number] DOB: [birth year] Today's Date: 09/10/2020    History of Present Illness 65 y/o female presented to ED on 5/31 with generalized waekness and L arm pain/weakness s/p fall down the stairs. 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 Radiographs negative for acute fxs. PMH: SAH s/p PICA aneurysm clipping, bilateral SDH, seizures, chronic tracheostomy, PEG, seizures, HTN, GERD, anxiety    PT Comments    Patient progressing towards physical therapy goals. Patient ambulated 50' and 20' with RW and minA, cues for RW proximity and upright posture. Patient performed repeated sit to stands with min guard, cues for hand placement for poor follow through. Continue to recommend SNF for ongoing Physical Therapy.       Follow Up Recommendations  SNF     Equipment Recommendations  None recommended by PT    Recommendations for Other Services       Precautions / Restrictions Precautions Precautions: Fall Precaution Comments: tele sitter Restrictions Weight Bearing Restrictions: No    Mobility  Bed Mobility Overal bed mobility: Needs Assistance Bed Mobility: Supine to Sit;Sit to Supine     Supine to sit: Supervision Sit to supine: Supervision   General bed mobility comments: supervision for safety    Transfers Overall transfer level: Needs assistance Equipment used: Rolling Montavius Subramaniam (2 wheeled) Transfers: Sit to/from Stand Sit to Stand: Min guard         General transfer comment: min guard for safety and line management. Cues for hand placement with poor following. Repeated sit to stand for strengthening x 5  Ambulation/Gait Ambulation/Gait assistance: Min guard;Min assist Gait Distance (Feet): 50 Feet (x20') Assistive device: Rolling Chaunta Bejarano (2 wheeled) Gait Pattern/deviations: Step-through pattern;Decreased step length - right;Decreased  step length - left;Decreased stride length;Drifts right/left Gait velocity: decreased   General Gait Details: minA for RW management as patient tends to leave RW during turns and drifts outside of Wilfred Dayrit. Cues for maintaining appropriate distance with RW and upright postur   Stairs             Wheelchair Mobility    Modified Rankin (Stroke Patients Only)       Balance Overall balance assessment: Needs assistance Sitting-balance support: Feet supported Sitting balance-Leahy Scale: Fair     Standing balance support: During functional activity;Bilateral upper extremity supported Standing balance-Leahy Scale: Poor Standing balance comment: reliant on UE support and external assis                            Cognition Arousal/Alertness: Awake/alert Behavior During Therapy: WFL for tasks assessed/performed;Impulsive (able to redirect) Overall Cognitive Status: History of cognitive impairments - at baseline                                 General Comments: Patient following commands with increased time      Exercises Other Exercises Other Exercises: Sit to stand x 5 with min guard    General Comments General comments (skin integrity, edema, etc.): on RA during session with VSS. PMV in place      Pertinent Vitals/Pain Pain Assessment: No/denies pain    Home Living                      Prior Function  PT Goals (current goals can now be found in the care plan section) Acute Rehab PT Goals Patient Stated Goal: to get out of bed PT Goal Formulation: Patient unable to participate in goal setting Time For Goal Achievement: 09/24/20 Potential to Achieve Goals: Fair Progress towards PT goals: Progressing toward goals    Frequency    Min 3X/week      PT Plan Current plan remains appropriate    Co-evaluation              AM-PAC PT "6 Clicks" Mobility   Outcome Measure  Help needed turning from your back to  your side while in a flat bed without using bedrails?: A Little Help needed moving from lying on your back to sitting on the side of a flat bed without using bedrails?: A Little Help needed moving to and from a bed to a chair (including a wheelchair)?: A Little Help needed standing up from a chair using your arms (e.g., wheelchair or bedside chair)?: A Little Help needed to walk in hospital room?: A Little Help needed climbing 3-5 steps with a railing? : A Lot 6 Click Score: 17    End of Session Equipment Utilized During Treatment: Gait belt Activity Tolerance: Patient tolerated treatment well Patient left: in bed;with call bell/phone within reach;with nursing/sitter in room Nurse Communication: Mobility status PT Visit Diagnosis: Other abnormalities of gait and mobility (R26.89);Unsteadiness on feet (R26.81);Difficulty in walking, not elsewhere classified (R26.2);Other symptoms and signs involving the nervous system (C78.938)     Time: 1017-5102 PT Time Calculation (min) (ACUTE ONLY): 24 min  Charges:  $Gait Training: 8-22 mins $Therapeutic Exercise: 8-22 mins                     Trung Wenzl A. Gilford Rile PT, DPT Acute Rehabilitation Services Pager [phone] Office [phone]    Linna Hoff 09/10/2020, 10:42 AM

## 2020-09-10 NOTE — Plan of Care (Signed)

## 2020-09-10 NOTE — Progress Notes (Signed)
Occupational Therapy Treatment Patient Details Name: Paulyne Mooty MRN: [id number] DOB: 1956/01/30 Today's Date: 09/10/2020    History of present illness 65 y/o female presented to ED on 5/31 with generalized waekness and L arm pain/weakness s/p fall down the stairs. 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 Radiographs negative for acute fxs. PMH: SAH s/p PICA aneurysm clipping, bilateral SDH, seizures, chronic tracheostomy, PEG, seizures, HTN, GERD, anxiety   OT comments  Patient seen this date to reassess plan of care and goals.  Patient is performing better than originally expected, plan of care increased to 2x/wk, with lower body ADL goals added, and remaining goals progressed to a more independent level.  New goals discussed, and patient is willing to try.  Patient ultimately wants to return home, however, SNF continues to be recommended as she continues to need increasing levels of support, which cannot currently be met at home via family.    Follow Up Recommendations  SNF    Equipment Recommendations  None recommended by OT    Recommendations for Other Services      Precautions / Restrictions Precautions Precautions: Fall Restrictions Weight Bearing Restrictions: No       Mobility Bed Mobility Overal bed mobility: Needs Assistance Bed Mobility: Supine to Sit;Sit to Supine     Supine to sit: Supervision;HOB elevated Sit to supine: Supervision     Patient Response: Cooperative  Transfers Overall transfer level: Needs assistance Equipment used: Rolling walker (2 wheeled) Transfers: Sit to/from Stand Sit to Stand: Min guard              Balance Overall balance assessment: Needs assistance Sitting-balance support: Feet supported Sitting balance-Leahy Scale: Fair     Standing balance support: During functional activity;Bilateral upper extremity supported Standing balance-Leahy Scale:  Poor Standing balance comment: reliant on UE support and external assis                           ADL either performed or assessed with clinical judgement   ADL       Grooming: Wash/dry face;Standing;Min guard;Cueing for safety;Cueing for sequencing                   Toilet Transfer: Minimal assistance;RW;Ambulation;Cueing for safety   Toileting- Clothing Manipulation and Hygiene: Supervision/safety;Set up;Sitting/lateral lean       Functional mobility during ADLs: Minimal assistance;Rolling walker;Cueing for safety;Min guard General ADL Comments: RW management as needed, patient tending to drift left and right.  Cues not to abandon RW.     Vision       Perception     Praxis      Cognition Arousal/Alertness: Awake/alert Behavior During Therapy: WFL for tasks assessed/performed;Impulsive Overall Cognitive Status: History of cognitive impairments - at baseline Area of Impairment: Following commands;Safety/judgement                       Following Commands: Follows one step commands with increased time Safety/Judgement: Decreased awareness of safety                                Pertinent Vitals/ Pain       Pain Assessment: No/denies pain Pain Intervention(s): Monitored during session  Frequency  Min 2X/week        Progress Toward Goals  OT Goals(current goals can now be found in the care plan section)     ADL Goals Pt Will Perform Grooming: with set-up;standing Pt Will Perform Upper Body Bathing: with set-up;sitting;standing Pt Will Perform Lower Body Bathing: sit to/from stand;with set-up Pt Will Perform Upper Body Dressing: with set-up;sitting;standing Pt Will Perform Lower Body Dressing: sit to/from stand;with supervision Pt Will Transfer to Toilet: with supervision;ambulating;regular height toilet  Plan Discharge plan remains  appropriate;Frequency remains appropriate    Co-evaluation                 AM-PAC OT "6 Clicks" Daily Activity     Outcome Measure     Help from another person taking care of personal grooming?: A Little Help from another person toileting, which includes using toliet, bedpan, or urinal?: A Little Help from another person bathing (including washing, rinsing, drying)?: A Little Help from another person to put on and taking off regular upper body clothing?: None Help from another person to put on and taking off regular lower body clothing?: None 6 Click Score: 17    End of Session Equipment Utilized During Treatment: Rolling walker  OT Visit Diagnosis: Unsteadiness on feet (R26.81);History of falling (Z91.81);Other symptoms and signs involving cognitive function   Activity Tolerance Patient tolerated treatment well   Patient Left in bed;with call bell/phone within reach;with bed alarm set   Nurse Communication          Time: 7353-2992 OT Time Calculation (min): 15 min  Charges: OT General Charges $OT Visit: 1 Visit OT Treatments $Self Care/Home Management : 8-22 mins  09/10/2020  Rich, OTR/L  Acute Rehabilitation Services  Office:  [phone]    Metta Clines 09/10/2020, 3:52 PM

## 2020-09-10 NOTE — Progress Notes (Signed)
Posey restraint was removed as per MD order, pt resting calmly at the moment, Tele sitter still ON, bed in lowest position, bed alarm ON and fall mats in place

## 2020-09-10 NOTE — TOC Progression Note (Signed)
Transition of Care [facility]) - Progression Note    Patient Details  Name: Stacy Moran MRN: [id number] Date of Birth: [birth year]  Transition of Care [facility]) CM/SW [place], [place] Phone Number: 09/10/2020, 2:03 PM  Clinical Narrative:   CSW continuing to attempt to reach [place], called and texted today with no response. CSW left messages asking for a return call. CSW updated MD with barriers to discharge. CSW to follow.    Expected Discharge Plan: [place] Barriers to Discharge: Continued Medical Work up  Expected Discharge Plan and Services Expected Discharge Plan: [place] In-house Referral: Clinical Social Work Discharge Planning Services: CM Consult Post Acute Care Choice: [place] arrangements for the past 2 months: Single Family Home                                       Social Determinants of Health (SDOH) Interventions    Readmission Risk Interventions No flowsheet data found.

## 2020-09-11 DIAGNOSIS — S065X9A Traumatic subdural hemorrhage with loss of consciousness of unspecified duration, initial encounter: Secondary | ICD-10-CM

## 2020-09-11 DIAGNOSIS — I69891 Dysphagia following other cerebrovascular disease: Secondary | ICD-10-CM

## 2020-09-11 DIAGNOSIS — I10 Essential (primary) hypertension: Secondary | ICD-10-CM

## 2020-09-11 DIAGNOSIS — Z930 Tracheostomy status: Secondary | ICD-10-CM

## 2020-09-11 DIAGNOSIS — R739 Hyperglycemia, unspecified: Secondary | ICD-10-CM

## 2020-09-11 NOTE — TOC Progression Note (Signed)
Transition of Care [facility]) - Progression Note    Patient Details  Name: Elinore Shults MRN: [id number] Date of Birth: 10/26/1955  Transition of Care [facility]) CM/SW [place], [place] Phone Number: 09/11/2020, 1:05 PM  Clinical Narrative:   CSW updated by [facility] that they have no more Medicaid beds this week, to check back next week. CSW received update from case manager through [facility] with an updated list of SNFs in network with the patient's insurance. CSW reached out to pending SNFs and sent referral out to other SNFs in network. CSW received the following responses: [facility] has no beds available, [facility] has no beds available but might soon, [facility] has no beds available but might soon, [place] has no Medicaid beds available. CSW sent referral out further to try to locate Medicaid bed, awaiting responses. CSW to follow.    Expected Discharge Plan: [place] Barriers to Discharge: Continued Medical Work up  Expected Discharge Plan and Services Expected Discharge Plan: [place] In-house Referral: Clinical Social Work Discharge Planning Services: CM Consult Post Acute Care Choice: [place] arrangements for the past 2 months: Single Family Home                                       Social Determinants of Health (SDOH) Interventions    Readmission Risk Interventions No flowsheet data found.

## 2020-09-11 NOTE — Progress Notes (Signed)
Family Medicine Teaching Service Daily Progress Note Intern Pager: [phone]  Stacy Moran name: Stacy Moran Medical record number: [id number] Date of birth: 05-Jun-1955 Age: 65 y.o. Gender: female  Primary Care Provider: [name], MD Consultants: Palliative Code Status: DNR  Pt Overview and Major Events to Date:  08/26/2020: Admitted to FPTS  Assessment and Plan: Stacy Moran is a 65 year old female who presented after a fall and now awaiting SNF placement.  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 Stacy Moran is medically stable for discharge to SNF  Recent fall with unsteady gait I Agitation 1: 1 telemetry sitter order is in place --c/w delirium and fall precautions --c/w Seroquel --c/w sitter or Posey belt and meds as needed for safety --Prefer redirecting and avoiding restraints as able  Constipation I s/p PEG tube Stacy Moran had bowel movement yesterday and feels comfortable today. --c/w Miralax --c/w Senna daily  Hypotension SBP 100-128, DBP 67-84 -- Holding amlodipine and labetalol this a.m.   FEN/GI: NPO PPx: SubQ Heparin   Status is: Inpatient  Remains inpatient appropriate because:Unsafe d/c plan  Dispo: The Stacy Moran is from: Home              Anticipated d/c is to: SNF              Stacy Moran currently is medically stable to d/c.   Difficult to place Stacy Moran No        Subjective:   Stacy Moran's blood pressure was low in 100s/70's.  Labetalol and hydralazine were held and blood pressure was repeated. Stacy Moran evaluated at bedside and she denies any symptoms or any complaints.  Objective: Temp:  [97.9 F (36.6 C)-99.2 F (37.3 C)] 97.9 F (36.6 C) (06/16 0343) Pulse Rate:  [70-88] 73 (06/16 0438) Resp:  [14-20] 16 (06/16 0438) BP: (100-122)/(67-84) 110/69 (06/16 0343) SpO2:  [98 %-100 %] 99 % (06/16 0438) FiO2 (%):  [21 %] 21 % (06/16  0438) Weight:  [130 lb 11.7 oz (59.3 kg)] 130 lb 11.7 oz (59.3 kg) (06/16 0416) Physical Exam: General: Pleasant elderly female lying comfortably in bed, NAD Cardiovascular: RRR Respiratory: Clear to auscultation bilaterally, trach in place Abdomen: Soft, nontender, nondistended, bowel sounds present Extremities: No peripheral edema appreciated  Laboratory: Recent Labs  Lab 09/08/20 0857  WBC 5.4  HGB 11.0*  HCT 34.9*  PLT 190   Recent Labs  Lab 09/08/20 0857  NA 138  K 4.2  CL 103  CO2 30  BUN 26*  CREATININE 0.62  CALCIUM 9.7  GLUCOSE 106*     Imaging/Diagnostic Tests: No results found.   Honor Junes, MD 09/11/2020, 5:15 AM PGY-1, [place] Intern pager: [phone], text pages welcome

## 2020-09-11 NOTE — Progress Notes (Signed)
Physical Therapy Treatment Patient Details Name: Stacy Moran MRN: [id number] DOB: [birth year] Today's Date: 09/11/2020    History of Present Illness 65 y/o female presented to ED on 5/31 with generalized waekness and L arm pain/weakness s/p fall down the stairs. 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 Radiographs negative for acute fxs. PMH: SAH s/p PICA aneurysm clipping, bilateral SDH, seizures, chronic tracheostomy, PEG, seizures, HTN, GERD, anxiety    PT Comments    Patient progressing towards physical therapy goals. Patient with improved RW management with no physical assist required and minimal cueing. Patient required min guard for ambulation and increasing ambulation distance this session. Continue to recommend SNF for ongoing Physical Therapy.       Follow Up Recommendations  SNF     Equipment Recommendations  None recommended by PT    Recommendations for Other Services       Precautions / Restrictions Precautions Precautions: Fall Precaution Comments: tele sitter Restrictions Weight Bearing Restrictions: No    Mobility  Bed Mobility Overal bed mobility: Needs Assistance Bed Mobility: Supine to Sit;Sit to Supine     Supine to sit: Supervision;HOB elevated Sit to supine: Supervision   General bed mobility comments: supervision for safety    Transfers Overall transfer level: Needs assistance Equipment used: Rolling Stacy Moran (2 wheeled) Transfers: Sit to/from Stand Sit to Stand: Min guard         General transfer comment: Cues for hand placement with poor follow through. Min guard for safety  Ambulation/Gait Ambulation/Gait assistance: Min guard Gait Distance (Feet): 200 Feet Assistive device: Rolling Stacy Moran (2 wheeled) Gait Pattern/deviations: Step-through pattern;Decreased step length - right;Decreased step length - left;Decreased stride length;Drifts right/left Gait velocity:  decreased   General Gait Details: min guard for safety. No LOB noted. Cues for wayfinding back to room. Improved RW management from previous session   Stairs             Wheelchair Mobility    Modified Rankin (Stroke Patients Only)       Balance Overall balance assessment: Needs assistance Sitting-balance support: Feet supported Sitting balance-Stacy Moran Scale: Fair     Standing balance support: During functional activity;Bilateral upper extremity supported Standing balance-Stacy Moran Scale: Poor Standing balance comment: reliant on UE support and external assis                            Cognition Arousal/Alertness: Awake/alert Behavior During Therapy: WFL for tasks assessed/performed;Impulsive Overall Cognitive Status: History of cognitive impairments - at baseline Area of Impairment: Following commands;Safety/judgement                       Following Commands: Follows one step commands with increased time Safety/Judgement: Decreased awareness of safety            Exercises      General Comments General comments (skin integrity, edema, etc.): VSS      Pertinent Vitals/Pain Pain Assessment: No/denies pain    Home Living                      Prior Function            PT Goals (current goals can now be found in the care plan section) Acute Rehab PT Goals Patient Stated Goal: to get out of bed PT Goal Formulation: Patient unable to participate in goal setting Time For Goal  Achievement: 09/24/20 Potential to Achieve Goals: Fair Progress towards PT goals: Progressing toward goals    Frequency    Min 3X/week      PT Plan Current plan remains appropriate    Co-evaluation              AM-PAC PT "6 Clicks" Mobility   Outcome Measure  Help needed turning from your back to your side while in a flat bed without using bedrails?: A Little Help needed moving from lying on your back to sitting on the side of a flat bed  without using bedrails?: A Little Help needed moving to and from a bed to a chair (including a wheelchair)?: A Little Help needed standing up from a chair using your arms (e.g., wheelchair or bedside chair)?: A Little Help needed to walk in hospital room?: A Little Help needed climbing 3-5 steps with a railing? : A Lot 6 Click Score: 17    End of Session Equipment Utilized During Treatment: Gait belt Activity Tolerance: Patient tolerated treatment well Patient left: in bed;with call bell/phone within reach;with nursing/sitter in room;with bed alarm set Nurse Communication: Mobility status PT Visit Diagnosis: Other abnormalities of gait and mobility (R26.89);Unsteadiness on feet (R26.81);Difficulty in walking, not elsewhere classified (R26.2);Other symptoms and signs involving the nervous system (R29.898)     Time: 4627-0350 PT Time Calculation (min) (ACUTE ONLY): 24 min  Charges:  $Gait Training: 23-37 mins                     Emery Dupuy A. Gilford Rile PT, DPT Acute Rehabilitation Services Pager [phone] Office [phone]    Linna Hoff 09/11/2020, 12:54 PM

## 2020-09-11 NOTE — Plan of Care (Signed)

## 2020-09-12 NOTE — Progress Notes (Signed)
Occupational Therapy Treatment Patient Details Name: Stacy Moran MRN: [id number] DOB: [birth year] Today's Date: 09/12/2020    History of present illness 65 y/o female presented to ED on 5/31 with generalized waekness and L arm pain/weakness s/p fall down the stairs. 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 Radiographs negative for acute fxs. PMH: SAH s/p PICA aneurysm clipping, bilateral SDH, seizures, chronic tracheostomy, PEG, seizures, HTN, GERD, anxiety   OT comments  Patient continues to progress toward patient focused OT goals.  Patient found sleeping soundly, initially resistant to getting up, but cooperative.  Patient able to simulate bathroom transfer/toilet use with essentially Min Guard at [employer] level.  Completed stand grooming with minimal safety cues to keep the RW in front of her, but otherwise was supervision.  Patient declined sitting in the recliner, all goals updated and plan of care reviewed.  OT plan is increased to 2x/wk, with progression of ADL goals in particular.  SNF continues to be recommended due to insufficient level of supports at home.  Follow Up Recommendations  SNF    Equipment Recommendations  None recommended by OT    Recommendations for Other Services      Precautions / Restrictions Precautions Precautions: Fall Restrictions Weight Bearing Restrictions: No       Mobility Bed Mobility Overal bed mobility: Needs Assistance Bed Mobility: Supine to Sit;Sit to Supine     Supine to sit: Supervision Sit to supine: Supervision   General bed mobility comments: supervision for safety and line management Patient Response: Flat affect  Transfers Overall transfer level: Needs assistance Equipment used: Rolling walker (2 wheeled) Transfers: Sit to/from Stand Sit to Stand: Min guard              Balance Overall balance assessment: Needs assistance Sitting-balance support: Feet  supported Sitting balance-Leahy Scale: Good     Standing balance support: During functional activity;Bilateral upper extremity supported Standing balance-Leahy Scale: Poor Standing balance comment: reliant on UE support and minimal cues                           ADL either performed or assessed with clinical judgement   ADL Overall ADL's : Needs assistance/impaired     Grooming: Wash/dry face;Standing;Min guard;Cueing for safety;Cueing for sequencing               Lower Body Dressing: Supervision/safety;Set up;Sitting/lateral leans Lower Body Dressing Details (indicate cue type and reason): Patient able to change to new socks without assist, and supervision seated EOB Toilet Transfer: Min guard;RW   Toileting- Clothing Manipulation and Hygiene: Supervision/safety;Set up;Sitting/lateral lean       Functional mobility during ADLs: Min guard;Rolling walker General ADL Comments: improved RW management noted this date.  Minimal cueing to turn completly around prior to sitting.     Vision       Perception     Praxis      Cognition Arousal/Alertness: Awake/alert Behavior During Therapy: WFL for tasks assessed/performed;Impulsive Overall Cognitive Status: History of cognitive impairments - at baseline                                                            Pertinent Vitals/ Pain  Pain Assessment: No/denies pain Pain Intervention(s): Monitored during session                                                          Frequency  Min 2X/week        Progress Toward Goals  OT Goals(current goals can now be found in the care plan section)  Progress towards OT goals: Progressing toward goals  Acute Rehab OT Goals Patient Stated Goal: patient would like to go home OT Goal Formulation: With patient Time For Goal Achievement: 09/24/20 Potential to Achieve Goals: Good  Plan Discharge plan remains  appropriate;Frequency remains appropriate    Co-evaluation                 AM-PAC OT "6 Clicks" Daily Activity     Outcome Measure   Help from another person eating meals?: None Help from another person taking care of personal grooming?: A Little Help from another person toileting, which includes using toliet, bedpan, or urinal?: A Little Help from another person bathing (including washing, rinsing, drying)?: A Little Help from another person to put on and taking off regular upper body clothing?: None Help from another person to put on and taking off regular lower body clothing?: A Little 6 Click Score: 20    End of Session Equipment Utilized During Treatment: Rolling walker  OT Visit Diagnosis: Unsteadiness on feet (R26.81);History of falling (Z91.81);Other symptoms and signs involving cognitive function Pain - Right/Left: Left   Activity Tolerance Patient tolerated treatment well   Patient Left in bed;with call bell/phone within reach;with bed alarm set   Nurse Communication          Time: 7106-2694 OT Time Calculation (min): 14 min  Charges: OT General Charges $OT Visit: 1 Visit OT Treatments $Self Care/Home Management : 8-22 mins  09/12/2020  Rich, OTR/L  Acute Rehabilitation Services  Office:  [phone]    Metta Clines 09/12/2020, 1:56 PM

## 2020-09-12 NOTE — Progress Notes (Signed)
Family Medicine Teaching Service Daily Progress Note Intern Pager: [phone]  Patient name: Stacy Moran Medical record number: [id number] Date of birth: Dec 28, 1955 Age: 65 y.o. Gender: female  Primary Care Provider: [name], MD Consultants: Palliative Code Status: DNR  Pt Overview and Major Events to Date:  08/26/2020: Admitted to FPTS  Assessment and Plan: Stacy Moran is a 65 year old female who presented after a fall and now awaiting SNF placement.  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 Patient is medically stable for discharge to SNF  Recent fall with unsteady gait I agitation Prefer redirecting and avoiding restraints as able. --c/w fall and delirium precautions --c/w Seroquel --c/w 1:1 sitter or Posey belth and mitts PRN  Constipation I s/p PEG tube Complains of constipation today and states refused her medications yesterday, encouraged to take them today. --c/w Miralax --c/w Senna daily  Hypotension SBP 113-138, DBP 69-91 -- Hold all BP meds   SNF vs ALF placement Patient has memory issue and would benefit from ALF placement rather than SNF placement.  Conversation with Education officer, museum and they will try to help this patient out with ALF placement but only foresee the problem would be trach -- Appreciate social worker assistance  FEN/GI: NPO PPx: SubQ Heparin   Status is: Inpatient  Remains inpatient appropriate because:Unsafe d/c plan  Dispo: The patient is from: Home              Anticipated d/c is to:  SNF vs ALF              Patient currently is medically stable to d/c.   Difficult to place patient No        Subjective:  No acute overnight events. Evaluated at bedside and complains of constipation.  Encouraged to take her medications today.  Objective: Temp:  [97.7 F (36.5 C)-99.3 F (37.4 C)] 97.7 F (36.5 C) (06/17  0354) Pulse Rate:  [65-85] 73 (06/17 0354) Resp:  [14-17] 16 (06/17 0354) BP: (113-133)/(69-91) 119/81 (06/17 0354) SpO2:  [97 %-100 %] 100 % (06/17 0354) FiO2 (%):  [21 %] 21 % (06/16 2042) Weight:  [131 lb 6.3 oz (59.6 kg)] 131 lb 6.3 oz (59.6 kg) (06/17 0354) Physical Exam: General: Pleasant elderly female lying comfortably in bed, NAD Cardiovascular: Regular rate and rhythm Respiratory: Trach in place, clear to auscultation bilaterally Abdomen: Soft, nontender, nondistended, bowel sounds + Extremities: No peripheral edema appreciated  Laboratory: Recent Labs  Lab 09/08/20 0857  WBC 5.4  HGB 11.0*  HCT 34.9*  PLT 190   Recent Labs  Lab 09/08/20 0857  NA 138  K 4.2  CL 103  CO2 30  BUN 26*  CREATININE 0.62  CALCIUM 9.7  GLUCOSE 106*     Imaging/Diagnostic Tests: No results found.   Honor Junes, MD 09/12/2020, 5:54 AM PGY-1, [place] Intern pager: [phone], text pages welcome

## 2020-09-12 NOTE — TOC Progression Note (Signed)
Transition of Care [facility]) - Progression Note    Patient Details  Name: Stacy Moran MRN: [id number] Date of Birth: 03-Nov-1955  Transition of Care [facility]) CM/SW [place], [place] Phone Number: 09/12/2020, 3:30 PM  Clinical Narrative:   CSW following for placement. CSW notified by [place] that they are unable to offer a bed for the patient. CSW contacted by MD asking about ALF instead. CSW contacted [place] to ask about Medicaid beds, but they are unable to accept the patient due to trach care. Per ALF representatives, trach is a skillable need and she will not be able to go to ALF with a trach. CSW updated MD. CSW to continue to pursue SNF placement. No bed offers at this time.    Expected Discharge Plan: [place] Barriers to Discharge: Continued Medical Work up  Expected Discharge Plan and Services Expected Discharge Plan: [place] In-house Referral: Clinical Social Work Discharge Planning Services: CM Consult Post Acute Care Choice: [place] arrangements for the past 2 months: Single Family Home                                       Social Determinants of Health (SDOH) Interventions    Readmission Risk Interventions No flowsheet data found.

## 2020-09-13 DIAGNOSIS — S065X9A Traumatic subdural hemorrhage with loss of consciousness of unspecified duration, initial encounter: Secondary | ICD-10-CM | POA: Diagnosis not present

## 2020-09-13 DIAGNOSIS — R739 Hyperglycemia, unspecified: Secondary | ICD-10-CM | POA: Diagnosis not present

## 2020-09-13 MED ORDER — WHITE PETROLATUM EX OINT
TOPICAL_OINTMENT | CUTANEOUS | Status: AC
  Administered 2020-09-13: 0.2
  Filled 2020-09-13: qty 28.35

## 2020-09-13 MED ORDER — AMLODIPINE BESYLATE 5 MG PO TABS
5.0000 mg | ORAL_TABLET | Freq: Every day | ORAL | Status: DC
  Administered 2020-09-13 – 2020-09-14 (×2): 5 mg via ORAL
  Filled 2020-09-13 (×2): qty 1

## 2020-09-13 NOTE — Progress Notes (Signed)
Family Medicine Teaching Service Daily Progress Note Intern Pager: [phone]  Patient name: Stacy Moran Medical record number: [id number] Date of birth: 05-14-1955 Age: 65 y.o. Gender: female  Primary Care Provider: [name], MD Consultants: Palliative Code Status: DNR  Pt Overview and Major Events to Date:  08/26/2020: Admitted to FPTS  Assessment and Plan: Ms. Boltz is a 65 year old female who presented after a fall and now awaiting SNF placement.  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 Patient is medically stable for discharge to SNF  Recent fall with unsteady gait I agitation Goal to avoid restraints with frequent redirection.  - fall and delirium precautions - Seroquel - 1:1 sitter or Posey belth and mitts PRN  Constipation I s/p PEG tube Patient had one BM in last 24 hours.  - Miralax - Senna daily  HTN  Patient hypotensive recently so BP meds held including amlodipine, hydralazine and labetalol. BP recently within normal limits and increasing 163/92 most recently.  - BP medications previously held due to hypotension  - add amlodipine 5mg  daily   SNF vs ALF placement Patient has memory issue and would benefit from ALF placement rather than SNF placement.  Conversation with Education officer, museum and they will try to help this patient out with ALF placement but only foresee the problem would be trach - Appreciate social worker assistance  FEN/GI: NPO PPx: SubQ Heparin   Status is: Inpatient  Remains inpatient appropriate because:Unsafe d/c plan  Dispo: The patient is from: Home              Anticipated d/c is to: SNF               Patient currently is medically stable to d/c.   Difficult to place patient No   Subjective:  No acute overnight events. Patient has no new complaints, states she would like to go home but is being patient about  finding a place.   Objective: Temp:  [97.5 F (36.4 C)-98.9 F (37.2 C)] 98.6 F (37 C) (06/18 0424) Pulse Rate:  [72-88] 79 (06/18 0424) Resp:  [13-19] 17 (06/18 0424) BP: (124-163)/(79-99) 163/92 (06/18 0424) SpO2:  [98 %-100 %] 100 % (06/18 0424) Weight:  [60.3 kg] 60.3 kg (06/18 0500)  Physical Exam: General: chronically ill appearing and frail female lying in bed in NAD  Cardiovascular: RRR  Respiratory: no increased WOB  Abdomen: nontender, BS present  Extremities: no pitting edema  Laboratory: Recent Labs  Lab 09/08/20 0857  WBC 5.4  HGB 11.0*  HCT 34.9*  PLT 190   Recent Labs  Lab 09/08/20 0857  NA 138  K 4.2  CL 103  CO2 30  BUN 26*  CREATININE 0.62  CALCIUM 9.7  GLUCOSE 106*    Imaging/Diagnostic Tests: No results found.   Eulis Foster, MD 09/13/2020, 6:28 AM PGY-2, [place] Intern pager: [phone], text pages welcome

## 2020-09-13 NOTE — Progress Notes (Addendum)
Family Medicine Teaching Service Daily Progress Note Intern Pager: [phone]  Patient name: Stacy Moran Medical record number: [id number] Date of birth: September 21, 1955 Age: 65 y.o. Gender: female  Primary Care Provider: [name], MD Consultants: Palliative  Code Status: DNR  Pt Overview and Major Events to Date:  08/26/2020: Admitted to FPTS  Assessment and Plan:  Stacy Moran is a 65 year old female who presented after a fall and now awaiting SNF placement.  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  Patient is medically stable for discharge to SNF   Recent fall with unsteady gait I agitation No acute events overnight. -Fall and delirium precautions -Seroquel 50mg  BID per tube  -1:1 sitter or Posey belth and mitts PRN   Constipation Is/p PEG tube -Miralax 17g BID per tube  -Senna daily per tube    HTN BP 154/97 overnight -BP medications previously held due to hypotension -Amlodipine 5mg  daily    SNF vs ALF placement Patient has memory issue and would benefit from ALF placement rather than SNF placement.  Conversation with Education officer, museum and they will try to help this patient out with ALF placement but only foresee the problem would be trach - Appreciate social worker assistance  FEN/GI: NPO, PEG feeds PPx: heparin    Status is: Inpatient  Remains inpatient appropriate because:Unsafe d/c plan  Dispo: The patient is from: Home              Anticipated d/c is to: SNF              Patient currently is not medically stable to d/c.   Difficult to place patient No   Subjective:  No acute events overnight   Objective: Temp:  [98 F (36.7 C)-99.4 F (37.4 C)] 98.2 F (36.8 C) (06/18 1927) Pulse Rate:  [72-97] 87 (06/18 1958) Resp:  [14-20] 19 (06/18 1958) BP: (136-163)/(89-103) 154/97 (06/18 1927) SpO2:  [99 %-100 %] 99 % (06/18 1927) FiO2  (%):  [21 %] 21 % (06/18 1504) Weight:  [60.3 kg] 60.3 kg (06/18 0500)   Physical Exam: General: Alert, no acute distress, pleasant  Cardio: Normal S1 and S2, RRR, no r/m/g Pulm: CTAB, normal work of breathing, trach in place  Abdomen: Bowel sounds normal. Abdomen soft and non-tender.   Extremities: No peripheral edema.  Neuro: Cranial nerves grossly intact, ANO x 3  Laboratory: Recent Labs  Lab 09/08/20 0857  WBC 5.4  HGB 11.0*  HCT 34.9*  PLT 190   Recent Labs  Lab 09/08/20 0857  NA 138  K 4.2  CL 103  CO2 30  BUN 26*  CREATININE 0.62  CALCIUM 9.7  GLUCOSE 106*    Imaging/Diagnostic Tests: No results found.   Lattie Haw, MD 09/13/2020, 10:15 PM PGY-2, [place] Intern pager: [phone], text pages welcome   I discussed this patient's case with Dr. Posey Pronto.  I agree with their plan as documented in their note for today.  Marland KitchenHospital Day: 20   Patient is stable for DC For questions or updates, please contact Family Medicine Teaching Service Resident On-call at pager [phone] or  [email or web address] - see pager "[place]"

## 2020-09-14 DIAGNOSIS — Z930 Tracheostomy status: Secondary | ICD-10-CM | POA: Diagnosis not present

## 2020-09-14 DIAGNOSIS — Z982 Presence of cerebrospinal fluid drainage device: Secondary | ICD-10-CM | POA: Diagnosis not present

## 2020-09-14 DIAGNOSIS — S065X9A Traumatic subdural hemorrhage with loss of consciousness of unspecified duration, initial encounter: Secondary | ICD-10-CM | POA: Diagnosis not present

## 2020-09-14 MED ORDER — AMLODIPINE BESYLATE 10 MG PO TABS
10.0000 mg | ORAL_TABLET | Freq: Every day | ORAL | Status: DC
  Administered 2020-09-15: 10 mg via ORAL
  Filled 2020-09-14: qty 1

## 2020-09-15 DIAGNOSIS — W108XXA Fall (on) (from) other stairs and steps, initial encounter: Secondary | ICD-10-CM

## 2020-09-15 DIAGNOSIS — Z982 Presence of cerebrospinal fluid drainage device: Secondary | ICD-10-CM

## 2020-09-15 LAB — BASIC METABOLIC PANEL
Anion gap: 7 (ref 5–15)
BUN: 21 mg/dL (ref 8–23)
CO2: 30 mmol/L (ref 22–32)
Calcium: 10.2 mg/dL (ref 8.9–10.3)
Chloride: 102 mmol/L (ref 98–111)
Creatinine, Ser: 0.6 mg/dL (ref 0.44–1.00)
GFR, Estimated: >60 mL/min (ref >60)
Glucose, Bld: 112 mg/dL — ABNORMAL HIGH (ref 70–99)
Potassium: 3.9 mmol/L (ref 3.5–5.1)
Sodium: 139 mmol/L (ref 135–145)

## 2020-09-15 LAB — CBC
HCT: 39 % (ref 36.0–46.0)
Hemoglobin: 12.2 g/dL (ref 12.0–15.0)
MCH: 26 pg (ref 26.0–34.0)
MCHC: 31.3 g/dL (ref 30.0–36.0)
MCV: 83.2 fL (ref 80.0–100.0)
Platelets: 153 10*3/uL (ref 150–400)
RBC: 4.69 MIL/uL (ref 3.87–5.11)
RDW: 15.5 % (ref 11.5–15.5)
WBC: 4.5 10*3/uL (ref 4.0–10.5)
nRBC: 0 % (ref 0.0–0.2)

## 2020-09-15 MED ORDER — AMLODIPINE BESYLATE 10 MG PO TABS
10.0000 mg | ORAL_TABLET | Freq: Every day | ORAL | Status: DC
  Administered 2020-09-16 – 2020-10-07 (×22): 10 mg
  Filled 2020-09-15 (×8): qty 1
  Filled 2020-09-15: qty 2
  Filled 2020-09-15: qty 1
  Filled 2020-09-15: qty 4
  Filled 2020-09-15 (×12): qty 1

## 2020-09-15 NOTE — Progress Notes (Signed)
Family Medicine Teaching Service Daily Progress Note Intern Pager: [phone]  Patient name: Stacy Moran Medical record number: [id number] Date of birth: [birth year] Age: 65 y.o. Gender: female  Primary Care Provider: [name], MD Consultants: Palliative care Code Status: DNR  Pt Overview and Major Events to Date:  08/26/2020: Admitted to FPTS  Assessment and Plan: Stacy Moran is a 65 year old female who presented after a fall and now awaiting SNF placement.  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 Patient is medically stable for discharge to SNF  Recent fall with unsteady gait I Agitation No acute events reported overnight.  Patient denies any complaints this AM --Fall and delirium precautions --Seroquel 50 mg twice daily per tube --1: 1 sitter or Posey belt and mitts PRN  Constipation I s/p PEG tube Patient has not had any bowel movement since 6/16.  Encouraged to take her MiraLAX and senna. --c/w Miralax 17g BID per tube --Senna daily per tube  HTN SBP 118-156. DBP 79-102 --c/w Norvasc 10 mg per tube  SNF placement SW informed that patient would not be able to get into ALF placement due to her trach placement.  Continuing to find a SNF placement for the patient --Appreciate social workers assistance   Voiding trial Patient has Foley since 09/03/2020.  We will try voiding trial today along with bladder scan to prevent any chances of infection. --Voiding trial --Bladder scan  FEN/GI: NPO, PEG feeds PPx: Sub Q Heparin   Status is: Inpatient  Remains inpatient appropriate because:Unsafe d/c plan  Dispo: The patient is from: Home              Anticipated d/c is to: SNF              Patient currently is medically stable to d/c.   Difficult to place patient No        Subjective:  No acute overnight events. Patient encouraged to take her  medications today to help with constipation and she shows good understanding about it.  Objective: Temp:  [97.7 F (36.5 C)-98.8 F (37.1 C)] 98 F (36.7 C) (06/20 0340) Pulse Rate:  [79-93] 80 (06/20 0440) Resp:  [16-20] 18 (06/20 0757) BP: (118-156)/(79-102) 148/89 (06/20 0757) SpO2:  [94 %-100 %] 98 % (06/20 0340) FiO2 (%):  [21 %] 21 % (06/20 0440) Weight:  [127 lb 13.9 oz (58 kg)] 127 lb 13.9 oz (58 kg) (06/20 0438) Physical Exam: General: Pleasant elderly lady lying comfortably in bed, NAD, on left side Cardiovascular: Regular rate and rhythm Respiratory: Clear to auscultation bilaterally, trach in place Abdomen: Soft, nontender, nondistended, bowel sounds present Extremities: No peripheral edema appreciated  Laboratory: Recent Labs  Lab 09/08/20 0857  WBC 5.4  HGB 11.0*  HCT 34.9*  PLT 190   Recent Labs  Lab 09/08/20 0857  NA 138  K 4.2  CL 103  CO2 30  BUN 26*  CREATININE 0.62  CALCIUM 9.7  GLUCOSE 106*     Imaging/Diagnostic Tests: No results found.   Honor Junes, MD 09/15/2020, 8:50 AM PGY-1, [place] Intern pager: [phone], text pages welcome

## 2020-09-15 NOTE — Progress Notes (Signed)
PT Cancellation Note  Patient Details Name: Stacy Moran MRN: [id number] DOB: [birth year]   Cancelled Treatment:    Reason Eval/Treat Not Completed: Fatigue/lethargy limiting ability to participate. Pt unable to maintain arousal at this time to fully participate with PT. Will follow up another date. Acute PT to continue during pt's hospital stay.  Willow Ora, PTA, [place] [phone] 09/15/20, 12:19 PM    Willow Ora 09/15/2020, 12:19 PM

## 2020-09-16 NOTE — TOC Progression Note (Signed)
Transition of Care [facility]) - Progression Note    Patient Details  Name: Stacy Moran MRN: [id number] Date of Birth: November 30, 1955  Transition of Care [facility]) CM/SW [place], [place] Phone Number: 09/16/2020, 4:18 PM  Clinical Narrative:   CSW attempted to contact [facility] yesterday and today to ask about Medicaid bed availability, and has received no answer. CSW contacted other facilities to ask about any upcoming Medicaid availability, and has found no bed for the patient yet. CSW to continue to follow.    Expected Discharge Plan: [place] Barriers to Discharge: Continued Medical Work up; SNF pending bed offer  Expected Discharge Plan and Services Expected Discharge Plan: [place] In-house Referral: Clinical Social Work Discharge Planning Services: CM Consult Post Acute Care Choice: [place] arrangements for the past 2 months: Single Family Home                                       Social Determinants of Health (SDOH) Interventions    Readmission Risk Interventions No flowsheet data found.

## 2020-09-16 NOTE — Progress Notes (Signed)
Family Medicine Teaching Service Daily Progress Note Intern Pager: [phone]  Patient name: Stacy Moran Medical record number: [id number] Date of birth: 09-06-1955 Age: 65 y.o. Gender: female  Primary Care Provider: [name], MD Consultants: Palliative care Code Status: DNR  Pt Overview and Major Events to Date:  08/26/2020: Admitted to FPTS  Assessment and Plan: Ms. Antenucci is a 65 year old female who presented after fall and now awaiting SNF placement.  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  Fall I Agitation No acute events reported overnight.  Patient denies any complaints. --c/w fall and delirium precautions --Seroquel 50 mg twice daily per tube --1: 1 sitter or Posey belt and mitts as needed  Constipation I s/p PEG tube Patient states she had a bowel movement yesterday but its not documented in chart.  Encouraged to take her medication today. --c/w Miralax 17 g BID per tube --Senna daly per tube  HTN SBP 118-148, DBP 81-100 --c/w Norvasc 10 mg per tube  SNF placement Social workers continue to working on SNF placement for this patient. --Appreciate SW assistance  Voiding trial s/p Foley  -- Able to void on her own, no Foley  FEN/GI: NPO, PEG feeds PPx: Sub Q heparin   Status is: Inpatient  Remains inpatient appropriate because:Unsafe d/c plan  Dispo: The patient is from: Home              Anticipated d/c is to: SNF              Patient currently is medically stable to d/c.   Difficult to place patient No        Subjective:  No Acute overnight events. Patient denies any new complaints and encouraged to take her medications on time.   Objective: Temp:  [97.8 F (36.6 C)-99.1 F (37.3 C)] 98.1 F (36.7 C) (06/21 0753) Pulse Rate:  [79-96] 86 (06/21 0803) Resp:  [14-20] 18 (06/21 0803) BP: (112-146)/(81-100) 112/88  (06/21 0753) SpO2:  [93 %-100 %] 100 % (06/21 0803) FiO2 (%):  [21 %] 21 % (06/21 0803) Weight:  [131 lb 9.8 oz (59.7 kg)] 131 lb 9.8 oz (59.7 kg) (06/21 0355) Physical Exam: General: Pleasant elderly lady lying comfortably in bed on left side, NAD Cardiovascular: Regular rate and rhythm Respiratory: Clear to auscultation bilaterally, trach in place Abdomen: Soft, nontender, nondistended, bowel sounds present Extremities: No peripheral edema appreciated  Laboratory: Recent Labs  Lab 09/15/20 0844  WBC 4.5  HGB 12.2  HCT 39.0  PLT 153   Recent Labs  Lab 09/15/20 0844  NA 139  K 3.9  CL 102  CO2 30  BUN 21  CREATININE 0.60  CALCIUM 10.2  GLUCOSE 112*    Imaging/Diagnostic Tests: No results found.   Honor Junes, MD 09/16/2020, 8:51 AM PGY-1, [place] Intern pager: [phone], text pages welcome

## 2020-09-16 NOTE — Progress Notes (Signed)
Physical Therapy Treatment Patient Details Name: Stacy Moran MRN: [id number] DOB: [birth year] Today's Date: 09/16/2020    History of Present Illness 65 y/o female presented to ED on 5/31 with generalized waekness and L arm pain/weakness s/p fall down the stairs. 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 Radiographs negative for acute fxs. PMH: SAH s/p PICA aneurysm clipping, bilateral SDH, seizures, chronic tracheostomy, PEG, seizures, HTN, GERD, anxiety    PT Comments    Pt is up to side of bed with telemetry unit, asked nursing to come see her over pt complaint of hunger.  Nursing in to administer feeding supplement, and PT assisted pt with blanket while up to keep her warmer. Follow along for goals of acute PT and will see if her line can be switched out for walking hardware next session.   Follow Up Recommendations  SNF     Equipment Recommendations  None recommended by PT    Recommendations for Other Services       Precautions / Restrictions Precautions Precautions: Fall Precaution Comments: tele sitter Restrictions Weight Bearing Restrictions: No    Mobility  Bed Mobility Overal bed mobility: Needs Assistance Bed Mobility: Supine to Sit;Sit to Supine Rolling: Min assist   Supine to sit: Min assist Sit to supine: Min assist        Transfers Overall transfer level: Needs assistance Equipment used: Rolling walker (2 wheeled) Transfers: Sit to/from Stand Sit to Stand: Min guard            Ambulation/Gait Ambulation/Gait assistance: Min Web designer (Feet): 18 Feet Assistive device: Rolling walker (2 wheeled) Gait Pattern/deviations: Step-to pattern;Shuffle;Decreased stride length Gait velocity: reduced Gait velocity interpretation: <1.31 ft/sec, indicative of household ambulator General Gait Details: remained at bedside due to lack of help to manage O2 line   Stairs              Wheelchair Mobility    Modified Rankin (Stroke Patients Only)       Balance     Sitting balance-Leahy Scale: Good     Standing balance support: Bilateral upper extremity supported;During functional activity Standing balance-Leahy Scale: Poor                              Cognition Arousal/Alertness: Awake/alert Behavior During Therapy: WFL for tasks assessed/performed;Impulsive Overall Cognitive Status: History of cognitive impairments - at baseline Area of Impairment: Safety/judgement;Following commands;Awareness                         Safety/Judgement: Decreased awareness of deficits;Decreased awareness of safety Awareness: Intellectual   General Comments: repetitive commands needed      Exercises      General Comments General comments (skin integrity, edema, etc.): requires help to maneuver walker and repetition as pt cannot remember her line will not allow walk away from bed      Pertinent Vitals/Pain Pain Assessment: No/denies pain    Home Living                      Prior Function            PT Goals (current goals can now be found in the care plan section) Acute Rehab PT Goals Patient Stated Goal: patient would like to go home Progress towards PT goals: Progressing toward goals    Frequency  Min 3X/week      PT Plan Current plan remains appropriate    Co-evaluation              AM-PAC PT "6 Clicks" Mobility   Outcome Measure  Help needed turning from your back to your side while in a flat bed without using bedrails?: A Little Help needed moving from lying on your back to sitting on the side of a flat bed without using bedrails?: A Little Help needed moving to and from a bed to a chair (including a wheelchair)?: A Little Help needed standing up from a chair using your arms (e.g., wheelchair or bedside chair)?: A Little Help needed to walk in hospital room?: A Little Help needed climbing 3-5  steps with a railing? : A Lot 6 Click Score: 17    End of Session Equipment Utilized During Treatment: Gait belt Activity Tolerance: Patient tolerated treatment well Patient left: in bed;with call bell/phone within reach;with nursing/sitter in room;with bed alarm set Nurse Communication: Mobility status PT Visit Diagnosis: Other abnormalities of gait and mobility (R26.89);Unsteadiness on feet (R26.81);Difficulty in walking, not elsewhere classified (R26.2);Other symptoms and signs involving the nervous system (R29.898)     Time: 0938-1829 PT Time Calculation (min) (ACUTE ONLY): 23 min  Charges:  $Gait Training: 8-22 mins $Therapeutic Activity: 8-22 mins            Ramond Dial 09/16/2020, 8:15 PM  Mee Hives, PT MS Acute Rehab Dept. Number: [place]

## 2020-09-16 NOTE — Plan of Care (Signed)

## 2020-09-17 NOTE — Progress Notes (Signed)
Physical Therapy Treatment Patient Details Name: Stacy Moran MRN: [id number] DOB: [birth year] Today's Date: 09/17/2020    History of Present Illness 65 y/o female presented to ED on 5/31 with generalized waekness and L arm pain/weakness s/p fall down the stairs. 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 Radiographs negative for acute fxs. PMH: SAH s/p PICA aneurysm clipping, bilateral SDH, seizures, chronic tracheostomy, PEG, seizures, HTN, GERD, anxiety    PT Comments    Pt reports being very fatigued today, states she did not get much sleep last night. Pt requiring min guard to min assist for mobility tasks today, with max cuing for use of RW and safety during mobility but cues minimally followed by pt. After toileting, pt declines further mobility or PT intervention, returned to supine. PT to continue to follow acutely.     Follow Up Recommendations  SNF     Equipment Recommendations  None recommended by PT    Recommendations for Other Services       Precautions / Restrictions Precautions Precautions: Fall Restrictions Weight Bearing Restrictions: No    Mobility  Bed Mobility Overal bed mobility: Needs Assistance Bed Mobility: Supine to Sit;Sit to Supine     Supine to sit: Min guard Sit to supine: Min guard   General bed mobility comments: for safety, HOB elevated and pt using bedrails to perform.    Transfers Overall transfer level: Needs assistance Equipment used: Rolling walker (2 wheeled) Transfers: Sit to/from Stand Sit to Stand: Min assist         General transfer comment: min assist for initial boost up and steadying, STS x2 from EOB and toilet.  Ambulation/Gait Ambulation/Gait assistance: Min assist Gait Distance (Feet): 20 Feet (to/from bathroom) Assistive device: Rolling walker (2 wheeled) Gait Pattern/deviations: Shuffle;Decreased stride length;Step-through pattern;Trunk  flexed Gait velocity: decr   General Gait Details: min assist to steady, max cuing for upright posture, placement in RW.   Stairs             Wheelchair Mobility    Modified Rankin (Stroke Patients Only)       Balance Overall balance assessment: Needs assistance Sitting-balance support: Feet supported Sitting balance-Leahy Scale: Good     Standing balance support: During functional activity;Bilateral upper extremity supported Standing balance-Leahy Scale: Poor Standing balance comment: reliant on UE support                            Cognition Arousal/Alertness: Lethargic Behavior During Therapy: WFL for tasks assessed/performed;Impulsive Overall Cognitive Status: History of cognitive impairments - at baseline Area of Impairment: Safety/judgement;Following commands;Awareness;Problem solving                       Following Commands: Follows one step commands with increased time;Follows one step commands inconsistently Safety/Judgement: Decreased awareness of deficits;Decreased awareness of safety Awareness: Intellectual Problem Solving: Difficulty sequencing;Requires verbal cues;Requires tactile cues General Comments: Pt with complete lack of insight into deficits, leaves RW behind multiple times during transitions (gait to washing hands at sink for instance) and PT cues pt to correct but this is minimally effective.      Exercises      General Comments        Pertinent Vitals/Pain Pain Assessment: Faces Faces Pain Scale: No hurt Pain Intervention(s): Limited activity within patient's tolerance;Monitored during session    Home Living  Prior Function            PT Goals (current goals can now be found in the care plan section) Acute Rehab PT Goals Patient Stated Goal: patient would like to go home PT Goal Formulation: With patient Time For Goal Achievement: 09/24/20 Potential to Achieve Goals:  Fair Progress towards PT goals: Progressing toward goals    Frequency    Min 3X/week      PT Plan Current plan remains appropriate    Co-evaluation              AM-PAC PT "6 Clicks" Mobility   Outcome Measure  Help needed turning from your back to your side while in a flat bed without using bedrails?: A Little Help needed moving from lying on your back to sitting on the side of a flat bed without using bedrails?: A Little Help needed moving to and from a bed to a chair (including a wheelchair)?: A Little Help needed standing up from a chair using your arms (e.g., wheelchair or bedside chair)?: A Little Help needed to walk in hospital room?: A Little Help needed climbing 3-5 steps with a railing? : A Lot 6 Click Score: 17    End of Session Equipment Utilized During Treatment: Gait belt Activity Tolerance: Patient tolerated treatment well Patient left: in bed;with call bell/phone within reach;with bed alarm set;Other (comment) (fall pads placed) Nurse Communication: Mobility status PT Visit Diagnosis: Other abnormalities of gait and mobility (R26.89);Unsteadiness on feet (R26.81);Difficulty in walking, not elsewhere classified (R26.2);Other symptoms and signs involving the nervous system (R29.898)     Time: 1440-1456 PT Time Calculation (min) (ACUTE ONLY): 16 min  Charges:  $Gait Training: 8-22 mins                    [name], PT DPT Acute Rehabilitation Services Pager [phone]  Office [phone]     [place] 09/17/2020, 4:07 PM

## 2020-09-17 NOTE — Plan of Care (Signed)

## 2020-09-17 NOTE — Progress Notes (Signed)
Nutrition Follow-up  DOCUMENTATION CODES:  Not applicable  INTERVENTION:  -Continue 355 ml Osmolite 1.2 QID via PEG -Continue 45 ml Prosource TF BID via PEG -Continue 200 ml free water every 6 hours via PEG  Complete regimen provides 1784 kcals, 100 grams protein, and 1970 ml free water daily, meeting 100% of estimated kcal and protein needs  NUTRITION DIAGNOSIS:  Inadequate oral intake related to inability to eat as evidenced by NPO status. -- ongoing  GOAL:  Patient will meet greater than or equal to 90% of their needs -- met with TF  MONITOR:  Labs, Weight trends, TF tolerance, Skin, I & O's  REASON FOR ASSESSMENT:  Other (Comment)    ASSESSMENT:  65 year old woman with recent (winter 2021) ruptured PICA anuerysm resulting in [facility] with prolonged hospital stay requiring tracheostomy placement, thyroidectomy (for tracheostomy), and PEG tube placement presenting with acute worsening of her subdural hematomas after a fall today. She complains of residual reduction in bilateral grip strength. Daughter [name], works at [employer]) provides additional history. The patient has poor sleep and becomes agitated and confused at night at times. This morning, despite multiple home measures include alarms and lights, the patient fell down the stairs. Denies headaches, chest pain, abdomina pain, endorses only hand pain.  Pt admitted with acute on chronic SDH. Now pending discharge to SNF. Medically stable for d/c per MD.   Pt remains on TC and is tolerating TF via PEG per RN. Current regimen: 355 ml Osmolite 1.2 QID, 45 ml Prosource Plus BID, and 200 ml free water flush 4 times daily. Complete regimen provides 1784 kcals, 100 grams protein, and 1964 ml free water daily, meeting 100% of estimated kcal and protein needs. Continue current nutrition plan of care.   Admit wt: 55.5 kg Current wt: 59.7 kg   Medications:  amLODipine  10 mg Per Tube Daily   chlorhexidine  15 mL Mouth Rinse BID    Chlorhexidine Gluconate Cloth  6 each Topical Daily   feeding supplement (OSMOLITE 1.2 CAL)  355 mL Per Tube TID WC & HS   feeding supplement (PROSource TF)  45 mL Per Tube BID   free water  200 mL Per Tube Q6H   heparin  5,000 Units Subcutaneous Q8H   levETIRAcetam  750 mg Per Tube BID   levothyroxine  100 mcg Per Tube Q0600   mouth rinse  15 mL Mouth Rinse q12n4p   polyethylene glycol  17 g Per Tube BID   QUEtiapine  50 mg Per Tube BID   senna  1 tablet Per Tube Daily   sodium chloride flush  3 mL Intravenous Once   Labs: Recent Labs  Lab 09/15/20 0844  NA 139  K 3.9  CL 102  CO2 30  BUN 21  CREATININE 0.60  CALCIUM 10.2  GLUCOSE 112*    Diet Order:   Diet Order             Diet NPO time specified  Diet effective now                  EDUCATION NEEDS:  Not appropriate for education at this time  Skin:  Skin Assessment: Reviewed RN Assessment  Last BM:  6/20 type 7  Height:  Ht Readings from Last 1 Encounters:  07/29/20 5' 6"  (1.676 m)   Weight:  Wt Readings from Last 1 Encounters:  09/16/20 59.7 kg   Ideal Body Weight:  59.1 kg  BMI:  Body  mass index is 21.24 kg/m.  Estimated Nutritional Needs:  Kcal:  9753-0051 Protein:  85-100 grams Fluid:  > 1.7 L   Larkin Ina, MS, RD, LDN RD pager number and weekend/on-call pager number located in [place].

## 2020-09-17 NOTE — Progress Notes (Signed)
Family Medicine Teaching Service Daily Progress Note Intern Pager: [phone]  Patient name: Stacy Moran Medical record number: [id number] Date of birth: 10-27-1955 Age: 65 y.o. Gender: female  Primary Care Provider: [name], MD Consultants: Palliative care Code Status: DNR  Pt Overview and Major Events to Date:  08/26/2020: Admitted to FPTS  Assessment and Plan: Stacy Moran is a 65 year old female presented after fall and now awaiting SNF placement.  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 Patient is medically stable for discharge.  Fall I Agitation No acute overnight events.  Denies any new complaints --c/w Fall and Delirium precautions --c/w Seroquel 50 mg BID per tube --c/w 1:1 sitter or Posey belt and mitts PRN  Constipation I PEG tube Patient had a bowel movement yesterday. --c/w Miralax 17 g BID per tube --Senna daily per tube  HTN SBP 112-147, DBP 73-94 --c/w Norvasc 10 mg per tube  SNF placement No bed offers yet. -- Appreciate SW assistance  FEN/GI: NPO, PEG feeds PPx: Sub Q Heparin   Status is: Inpatient  Remains inpatient appropriate because:Unsafe d/c plan  Dispo: The patient is from: Home              Anticipated d/c is to: SNF              Patient currently is medically stable to d/c.   Difficult to place patient No        Subjective:  No acute overnight events. Evaluated at bedside this morning and needed some cleaning of tracheostomy tube.  RN informed.  Objective: Temp:  [97.6 F (36.4 C)-98.9 F (37.2 C)] 97.6 F (36.4 C) (06/22 0823) Pulse Rate:  [77-87] 77 (06/22 0823) Resp:  [12-20] 17 (06/22 0823) BP: (125-147)/(73-95) 142/95 (06/22 0823) SpO2:  [96 %-100 %] 100 % (06/22 0823) FiO2 (%):  [21 %] 21 % (06/22 0356) Physical Exam: General: Pleasant lady lying comfortably in bed, NAD Cardiovascular: Regular  rate and rhythm Respiratory: Clear to auscultation bilaterally, trach in place Abdomen: Soft, nontender, nondistended, bowel sounds present Extremities: No peripheral edema noted  Laboratory: Recent Labs  Lab 09/15/20 0844  WBC 4.5  HGB 12.2  HCT 39.0  PLT 153   Recent Labs  Lab 09/15/20 0844  NA 139  K 3.9  CL 102  CO2 30  BUN 21  CREATININE 0.60  CALCIUM 10.2  GLUCOSE 112*    Imaging/Diagnostic Tests: No results found.   Honor Junes, MD 09/17/2020, 8:43 AM PGY-1, [place] Intern pager: [phone], text pages welcome

## 2020-09-17 NOTE — Progress Notes (Addendum)
1:35pm: Patient denied admission at [facility].  12pm: There are no Medicaid beds available at [facility] at this time.  Patient's clinicals under review at [facility].  10am: CSW spoke with Helene Kelp at [facility] who states the facility can accept old trach's so she will have her DON review referral for possible admission into the facility.  [name], MSW, LCSW Transitions of Care  Clinical Social Worker II [phone]

## 2020-09-18 DIAGNOSIS — S065X9A Traumatic subdural hemorrhage with loss of consciousness of unspecified duration, initial encounter: Secondary | ICD-10-CM

## 2020-09-18 DIAGNOSIS — Z930 Tracheostomy status: Secondary | ICD-10-CM

## 2020-09-18 DIAGNOSIS — I69891 Dysphagia following other cerebrovascular disease: Secondary | ICD-10-CM

## 2020-09-18 DIAGNOSIS — W108XXA Fall (on) (from) other stairs and steps, initial encounter: Secondary | ICD-10-CM

## 2020-09-18 DIAGNOSIS — I10 Essential (primary) hypertension: Secondary | ICD-10-CM

## 2020-09-18 DIAGNOSIS — Z982 Presence of cerebrospinal fluid drainage device: Secondary | ICD-10-CM

## 2020-09-18 NOTE — Progress Notes (Signed)
Family Medicine Teaching Service Daily Progress Note Intern Pager: [phone]  Patient name: Stacy Moran Medical record number: [id number] Date of birth: [birth year] Age: 65 y.o. Gender: female  Primary Care Provider: [name], MD Consultants: Palliative care Code Status: DNR  Pt Overview and Major Events to Date:  08/26/2020: Admitted to FPTS  Assessment and Plan: Stacy Moran is a 65 year old female presented after fall and now awaiting SNF placement.  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  Fall I Agitation No acute overnight events for this patient.  Denies any complaints this morning except eager to go from hospital. --c/w Fall and Delirium precautions --c/w Seroquel 50 mg BID per tube --c/w 1:1 sitter or Posey belt and mitts PRN  Constipation I PEG tube --c/w MiraLAX 17 g twice daily per tube --c/w Senna daily per tube  HTN SBP 116-142, DBP 79-95 --c/w Norvasc 10 mg per tube  SNF placement No bed offers yet -- Appreciate social worker assistance  FEN/GI: NPO, PEG feeds PPx: Sub Q Heparin Dispo:SNF  , awaiting placement .   Subjective:  No acute overnight events. Denies any complaint this morning.  Objective: Temp:  [97.6 F (36.4 C)-99.3 F (37.4 C)] 98.6 F (37 C) (06/23 0351) Pulse Rate:  [77-95] 84 (06/23 0351) Resp:  [14-19] 17 (06/23 0351) BP: (116-142)/(79-95) 128/79 (06/23 0351) SpO2:  [91 %-100 %] 99 % (06/23 0351) FiO2 (%):  [21 %] 21 % (06/23 0000) Physical Exam: General: Pleasant lady lying comfortably in bed, NAD Cardiovascular: Regular rate and rhythm Respiratory: Clear to auscultation bilaterally, trach in place Abdomen: Soft, nontender, nondistended, bowel sounds present Extremities: No peripheral edema appreciated  Laboratory: Recent Labs  Lab 09/15/20 0844  WBC 4.5  HGB 12.2  HCT 39.0  PLT 153   Recent Labs   Lab 09/15/20 0844  NA 139  K 3.9  CL 102  CO2 30  BUN 21  CREATININE 0.60  CALCIUM 10.2  GLUCOSE 112*     Imaging/Diagnostic Tests: No results found.   Honor Junes, MD 09/18/2020, 4:41 AM PGY-1, [place] Intern pager: [phone], text pages welcome

## 2020-09-18 NOTE — Progress Notes (Signed)
Occupational Therapy Treatment Patient Details Name: Stacy Moran MRN: [id number] DOB: 1956-03-17 Today's Date: 09/18/2020    History of present illness 65 y/o female presented to ED on 5/31 with generalized waekness and L arm pain/weakness s/p fall down the stairs. 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 Radiographs negative for acute fxs. PMH: SAH s/p PICA aneurysm clipping, bilateral SDH, seizures, chronic tracheostomy, PEG, seizures, HTN, GERD, anxiety   OT comments  Patient seen for in room mobility/toileting and stand grooming.  She continues to need setup, cueing and up to [facility] for balance support at RW level.  Patient continues to require 24 hour assist as needed, which cannot be met at home.  SNF has been recommended.  OT reduced the patient's frequency to 1x/wk, as she is reaching her maximum potential given cognitive deficits.  OT will follow for an addition week or two, but she will be discharged to RN and nursing tech care.    Follow Up Recommendations  SNF    Equipment Recommendations  None recommended by OT    Recommendations for Other Services      Precautions / Restrictions Precautions Precautions: Fall Restrictions Weight Bearing Restrictions: No       Mobility Bed Mobility Overal bed mobility: Needs Assistance Bed Mobility: Supine to Sit;Sit to Supine     Supine to sit: Supervision Sit to supine: Supervision     Patient Response: Flat affect;Cooperative  Transfers Overall transfer level: Needs assistance Equipment used: Rolling walker (2 wheeled) Transfers: Sit to/from Stand Sit to Stand: Min guard              Balance Overall balance assessment: Needs assistance Sitting-balance support: Feet supported Sitting balance-Leahy Scale: Good     Standing balance support: Bilateral upper extremity supported Standing balance-Leahy Scale: Poor Standing balance comment:  reliant on UE support                           ADL either performed or assessed with clinical judgement   ADL       Grooming: Oral care;Supervision/safety;Standing;Wash/dry hands;Wash/dry Sports administrator: Min guard;RW           Functional mobility during ADLs: Min guard;Rolling walker                         Cognition Arousal/Alertness: Awake/alert Behavior During Therapy: Impulsive Overall Cognitive Status: History of cognitive impairments - at baseline Area of Impairment: Safety/judgement;Problem solving                       Following Commands: Follows one step commands inconsistently Safety/Judgement: Decreased awareness of deficits;Decreased awareness of safety                                Pertinent Vitals/ Pain       Pain Assessment: No/denies pain Pain Intervention(s): Monitored during session  Frequency  Min 1X/week        Progress Toward Goals  OT Goals(current goals can now be found in the care plan section)  Progress towards OT goals: Progressing toward goals  Acute Rehab OT Goals Patient Stated Goal: Hoping to leave the hospital soon OT Goal Formulation: With patient Time For Goal Achievement: 09/24/20 Potential to Achieve Goals: [place] Discharge plan remains appropriate;Frequency needs to be updated    Co-evaluation                 AM-PAC OT "6 Clicks" Daily Activity     Outcome Measure   Help from another person eating meals?: None Help from another person taking care of personal grooming?: A Little Help from another person toileting, which includes using toliet, bedpan, or urinal?: A Little Help from another person bathing (including washing, rinsing, drying)?: A Little Help from another person to put on and taking off regular upper body clothing?: None Help from another  person to put on and taking off regular lower body clothing?: A Little 6 Click Score: 20    End of Session Equipment Utilized During Treatment: Rolling walker  OT Visit Diagnosis: Unsteadiness on feet (R26.81);History of falling (Z91.81);Other symptoms and signs involving cognitive function   Activity Tolerance Patient tolerated treatment well   Patient Left in bed;with bed alarm set   Nurse Communication Mobility status        Time: 4114-6431 OT Time Calculation (min): 13 min  Charges: OT General Charges $OT Visit: 1 Visit OT Treatments $Self Care/Home Management : 8-22 mins  09/18/2020  Rich, OTR/L  Acute Rehabilitation Services  Office:  [phone]    Metta Clines 09/18/2020, 1:54 PM

## 2020-09-18 NOTE — Progress Notes (Signed)
SLP Cancellation Note  Patient Details Name: Stacy Moran MRN: [id number] DOB: [birth year]   Cancelled treatment:       Reason Eval/Treat Not Completed: Other (comment). ENT Eval is needed for any hope of improvement in dysphagia. No SLP interventions will be effective at this time. Will sign off for dysphagia. 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 Have been attempted.    Dillen Belmontes, [name] 09/18/2020, 7:36 AM

## 2020-09-18 NOTE — Plan of Care (Signed)
  Problem: Safety: Goal: Non-violent Restraint(s) 09/18/2020 1704 by Cassell Smiles, RN Outcome: Progressing 09/18/2020 1704 by Cassell Smiles, RN Outcome: Progressing   Problem: Safety: Goal: Non-violent Restraint(s) 09/18/2020 1704 by Cassell Smiles, RN Outcome: Progressing 09/18/2020 1704 by Cassell Smiles, RN Outcome: Progressing   Problem: Education: Goal: Knowledge of General Education information will improve Description: Including pain rating scale, medication(s)/side effects and non-pharmacologic comfort measures 09/18/2020 1704 by Cassell Smiles, RN Outcome: Progressing 09/18/2020 1704 by Cassell Smiles, RN Outcome: Progressing   Problem: Health Behavior/Discharge Planning: Goal: Ability to manage health-related needs will improve 09/18/2020 1704 by Cassell Smiles, RN Outcome: Progressing 09/18/2020 1704 by Cassell Smiles, RN Outcome: Progressing   Problem: Clinical Measurements: Goal: Ability to maintain clinical measurements within normal limits will improve 09/18/2020 1704 by Cassell Smiles, RN Outcome: Progressing 09/18/2020 1704 by Cassell Smiles, RN Outcome: Progressing Goal: Will remain free from infection 09/18/2020 1704 by Cassell Smiles, RN Outcome: Progressing 09/18/2020 1704 by Cassell Smiles, RN Outcome: Progressing Goal: Diagnostic test results will improve 09/18/2020 1704 by Cassell Smiles, RN Outcome: Progressing 09/18/2020 1704 by Cassell Smiles, RN Outcome: Progressing Goal: Respiratory complications will improve 09/18/2020 1704 by Cassell Smiles, RN Outcome: Progressing 09/18/2020 1704 by Cassell Smiles, RN Outcome: Progressing Goal: Cardiovascular complication will be avoided 09/18/2020 1704 by Cassell Smiles, RN Outcome: Progressing 09/18/2020 1704 by Cassell Smiles, RN Outcome: Progressing   Problem: Clinical Measurements: Goal: Will remain free from infection 09/18/2020 1704 by Cassell Smiles, RN Outcome:  Progressing 09/18/2020 1704 by Cassell Smiles, RN Outcome: Progressing   Problem: Activity: Goal: Risk for activity intolerance will decrease 09/18/2020 1704 by Cassell Smiles, RN Outcome: Progressing 09/18/2020 1704 by Cassell Smiles, RN Outcome: Progressing   Problem: Nutrition: Goal: Adequate nutrition will be maintained 09/18/2020 1704 by Cassell Smiles, RN Outcome: Progressing 09/18/2020 1704 by Cassell Smiles, RN Outcome: Progressing   Problem: Coping: Goal: Level of anxiety will decrease 09/18/2020 1704 by Cassell Smiles, RN Outcome: Progressing 09/18/2020 1704 by Cassell Smiles, RN Outcome: Progressing

## 2020-09-19 DIAGNOSIS — I10 Essential (primary) hypertension: Secondary | ICD-10-CM

## 2020-09-19 NOTE — Progress Notes (Signed)
Family Medicine Teaching Service Daily Progress Note Intern Pager: [phone]  Patient name: Stacy Moran Medical record number: [id number] Date of birth: 05-03-1955 Age: 65 y.o. Gender: female  Primary Care Provider: [name], MD Consultants: Palliative Care Code Status: DNR  Pt Overview and Major Events to Date:  08/26/2020: Admitted to FPTS   Assessment and Plan: Ms. Bouvier is a 65 year old female presented after fall and now awaiting SNF placement.  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  Fall  Agitation No fall since admission, patient has not been with restraints or sitter over the last 24 hours.  - Continue fall and delirium precautions - Continue home seroquel 50mg  BID per tube  Constipation In the setting of PEG tube dependency.  Last BM documented in chart on 6/20, patient without abdominal discomfort.  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 - Speech therapy no longer following  SNF Placement No bed offers currently - Appreciate assistance from social work  FEN/GI: NPO with PEG feeds PPx: Subcutaneous heparin Dispo:SNF in 3 or more days. Barriers include SNF placement.   Subjective:  Patient feeling overall well this morning, states she has had a bowel movement in the last few days and doesn't have any urge to go. Patient is requesting coffee. Daughter may be coming today.  Objective: Temp:  [97.5 F (36.4 C)-98.2 F (36.8 C)] 98 F (36.7 C) (06/24 0806) Pulse Rate:  [80-87] 80 (06/24 0825) Resp:   [16-19] 16 (06/24 0825) BP: (106-154)/(61-107) 133/94 (06/24 0806) SpO2:  [97 %-100 %] 100 % (06/24 0825) FiO2 (%):  [21 %] 21 % (06/24 0400) Physical Exam: General: NAD, sitting up in recliner next to bed, well-appearing Cardiovascular: RRR, no murmur appreciated Respiratory: Breathing comfortably on room air, no increased work of breathing Abdomen: Soft, nontender, nondistended, bowel sounds present Extremities: Moving all extremities equally and appropriately  Laboratory: Recent Labs  Lab 09/15/20 0844  WBC 4.5  HGB 12.2  HCT 39.0  PLT 153   Recent Labs  Lab 09/15/20 0844  NA 139  K 3.9  CL 102  CO2 30  BUN 21  CREATININE 0.60  CALCIUM 10.2  GLUCOSE 112*     Imaging/Diagnostic Tests: No results found.   Rise Patience, DO 09/19/2020, 9:07 AM PGY-1, [place] Intern pager: [phone], text pages welcome

## 2020-09-19 NOTE — Plan of Care (Signed)

## 2020-09-19 NOTE — Progress Notes (Signed)
Physical Therapy Treatment Patient Details Name: Stacy Moran MRN: [id number] DOB: Apr 07, 1955 Today's Date: 09/19/2020    History of Present Illness 65 y/o female presented to ED on 5/31 with generalized waekness and L arm pain/weakness s/p fall down the stairs. 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 Radiographs negative for acute fxs. PMH: SAH s/p PICA aneurysm clipping, bilateral SDH, seizures, chronic tracheostomy, PEG, seizures, HTN, GERD, anxiety    PT Comments    Pt sitting EOB upon PT arrival to room. Pt ambulatory in hallway with use of RW and cuing for form and safety, overall requiring min guard to min assist for mobility. Pt continuing to lack insight into deficits and has poor safety awareness. Continuing to recommend SNF.    Follow Up Recommendations  SNF     Equipment Recommendations  None recommended by PT    Recommendations for Other Services       Precautions / Restrictions Precautions Precautions: Fall Restrictions Weight Bearing Restrictions: No    Mobility  Bed Mobility Overal bed mobility: Needs Assistance             General bed mobility comments: sitting EOB upon PT arrival    Transfers Overall transfer level: Needs assistance Equipment used: Rolling walker (2 wheeled) Transfers: Sit to/from Stand   Stand pivot transfers: Min assist       General transfer comment: min assist for initial boost up from EOB, low bed height.  Ambulation/Gait Ambulation/Gait assistance: Min guard Gait Distance (Feet): 150 Feet (x2) Assistive device: Rolling walker (2 wheeled) Gait Pattern/deviations: Shuffle;Decreased stride length;Step-through pattern;Trunk flexed Gait velocity: decr   General Gait Details: min guard for safety, verbal cuing for upright posture, placement in RW   Stairs             Wheelchair Mobility    Modified Rankin (Stroke Patients Only)        Balance Overall balance assessment: Needs assistance Sitting-balance support: Feet supported Sitting balance-Leahy Scale: Good     Standing balance support: Bilateral upper extremity supported Standing balance-Leahy Scale: Poor Standing balance comment: reliant on UE support                            Cognition Arousal/Alertness: Awake/alert Behavior During Therapy: Impulsive Overall Cognitive Status: History of cognitive impairments - at baseline Area of Impairment: Safety/judgement;Problem solving;Following commands                       Following Commands: Follows one step commands inconsistently;Follows one step commands with increased time Safety/Judgement: Decreased awareness of deficits;Decreased awareness of safety   Problem Solving: Difficulty sequencing;Requires verbal cues;Requires tactile cues General Comments: Lacks insight into deficits, chooses to do things unsafely at times.      Exercises      General Comments        Pertinent Vitals/Pain Pain Assessment: Faces Faces Pain Scale: No hurt Pain Intervention(s): Monitored during session    Home Living                      Prior Function            PT Goals (current goals can now be found in the care plan section) Acute Rehab PT Goals Patient Stated Goal: patient would like to go home PT Goal Formulation: With patient Time For Goal Achievement: 09/24/20 Potential to Achieve  Goals: Fair Progress towards PT goals: Progressing toward goals    Frequency    Min 3X/week      PT Plan Current plan remains appropriate    Co-evaluation              AM-PAC PT "6 Clicks" Mobility   Outcome Measure  Help needed turning from your back to your side while in a flat bed without using bedrails?: A Little Help needed moving from lying on your back to sitting on the side of a flat bed without using bedrails?: A Little Help needed moving to and from a bed to a chair  (including a wheelchair)?: A Little Help needed standing up from a chair using your arms (e.g., wheelchair or bedside chair)?: A Little Help needed to walk in hospital room?: A Little Help needed climbing 3-5 steps with a railing? : A Lot 6 Click Score: 17    End of Session   Activity Tolerance: Patient tolerated treatment well Patient left: in bed;with call bell/phone within reach;with bed alarm set;Other (comment) (sitting EOB, fall pads placed. Pt declines laying back down) Nurse Communication: Mobility status PT Visit Diagnosis: Other abnormalities of gait and mobility (R26.89);Unsteadiness on feet (R26.81);Difficulty in walking, not elsewhere classified (R26.2);Other symptoms and signs involving the nervous system (R29.898)     Time: 1429-1450 PT Time Calculation (min) (ACUTE ONLY): 21 min  Charges:  $Gait Training: 8-22 mins                    [name], PT DPT Acute Rehabilitation Services Pager [phone]  Office [phone]    Roxine Caddy E Ruffin Pyo 09/19/2020, 4:40 PM

## 2020-09-19 NOTE — Progress Notes (Addendum)
Family Medicine Teaching Service Daily Progress Note Intern Pager: [phone]  Patient name: Stacy Moran Medical record number: [id number] Date of birth: 1955/07/28 Age: 65 y.o. Gender: female  Primary Care Provider: [name], MD Consultants: Palliative Care Code Status: DNR  Pt Overview and Major Events to Date:  08/26/2020: Admitted to FPTS   Assessment and Plan: [name] is a 65 year old female who presented after fall and now awaiting SNF placement.  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  Fall  Agitation Patient continues to work with PT. Calm on exam this AM. No overnight reports of agitation. Patient reports sleeping well.  - Continue fall and delirium precautions - Continue home seroquel 50mg  BID per tube  Constipation In the setting of PEG tube dependency. 1 BM reported overnight.   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 - Speech therapy consult after ENT evaluation to evaluate for options for oral feeding  - will consult ENT   SNF Placement No bed offers currently - Appreciate assistance from social work  FEN/GI: NPO with PEG feeds PPx: Subcutaneous heparin Dispo:SNF in 3 or more days. Barriers include SNF placement.   Subjective:  Patient reports no complaints this AM. She would like to go home. Discussed SNF placement pending. Patient still interested in eating orally. Denies pain.    Objective: Temp:  [97.7 F (36.5 C)-98.8 F (37.1 C)] 98.8 F (37.1 C) (06/25 0349) Pulse Rate:  [80-97] 83 (06/25 0349) Resp:  [16-18] 18 (06/25 0349) BP: (115-142)/(78-94) 115/79  (06/25 0349) SpO2:  [96 %-100 %] 100 % (06/25 0349) FiO2 (%):  [21 %] 21 % (06/24 2048)  Physical Exam: General: frail, chronically ill appearing female in NAD awake watching TV  Cardiovascular: RRR, radial pulses palpable  Respiratory: trach collar, no signs of respiratory distress, appropriate saturations  Extremities: no edema, no injuries   Laboratory: Recent Labs  Lab 09/15/20 0844  WBC 4.5  HGB 12.2  HCT 39.0  PLT 153   Recent Labs  Lab 09/15/20 0844  NA 139  K 3.9  CL 102  CO2 30  BUN 21  CREATININE 0.60  CALCIUM 10.2  GLUCOSE 112*     Imaging/Diagnostic Tests: No results found.   Eulis Foster, MD 09/20/2020, 6:24 AM PGY-2, [place] Intern pager: [phone], text pages welcome

## 2020-09-20 NOTE — Progress Notes (Addendum)
Family Medicine Teaching Service Daily Progress Note Intern Pager: [phone]  Patient name: Tamelia Michalowski Medical record number: [id number] Date of birth: [birth year] Age: 65 y.o. Gender: female  Primary Care Provider: [name], MD Consultants: Palliative Care Code Status: DNR  Pt Overview and Major Events to Date:  05/31: Admitted to FPTS   Assessment and Plan: Ms. Conradt is a 65 year old female who presented after fall and now awaiting SNF placement.  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   Fall  Agitation Slept comfortably overnight. No agitation reported by nursing staff.  Has been without restraints for some time now -Continue delirium and fall precautions -Continue Seroquel BID  Constipation Last recorded BM 05/25 -Continue Miralax BID -Continue Senna   Dysphagia  PEG tube dependent ENT consult per SLP for further evaluation. -SLP has signed off, will need to reconsult after ENT evaluation -Continue Tube feeding  -Follow up ENT consult 06/26  SNF Placement Awaiting bed offers -CSW following, appreciate efforts in assisting with bed placement  FEN/GI: NPO with PEG feeds PPx: Subcutaneous heparin Dispo:SNF in 3 or more days. Barriers include SNF placement.   Subjective:  No acute events overnight. Patient sleeping comfortably.  Awakens to voice.  No complaints voiced.  Objective: Temp:  [97.7 F (36.5 C)-98.4 F (36.9 C)] 97.7 F (36.5 C) (06/26 0310) Pulse Rate:  [75-90] 78 (06/26 0400) Resp:  [16-22] 18 (06/26 0400) BP: (106-136)/(69-87) 106/69 (06/26 0310) SpO2:  [98 %-100 %] 100 % (06/26 0400) FiO2 (%):  [21 %] 21 % (06/26 0400)  Physical Exam: General: 65 y.o. female in NAD Cardio: RRR no m/r/g Lungs: CTAB, no wheezing, no rhonchi, no crackles, no IWOB on trach collar Abdomen: Soft, non-tender to palpation, non-distended,  positive bowel sounds Skin: warm and dry Extremities: No edema   Laboratory: Recent Labs  Lab 09/15/20 0844  WBC 4.5  HGB 12.2  HCT 39.0  PLT 153   Recent Labs  Lab 09/15/20 0844  NA 139  K 3.9  CL 102  CO2 30  BUN 21  CREATININE 0.60  CALCIUM 10.2  GLUCOSE 112*      Imaging/Diagnostic Tests: No results found.   Carollee Leitz, MD 09/21/2020, 6:58 AM PGY-2, [place] Intern pager: [phone], text pages welcome

## 2020-09-21 NOTE — Progress Notes (Signed)
FPTS Interim Progress Note  Patient sleeping and resting comfortably.  Rounded with primary RN.  No concerns voiced.  No orders required.  Appreciated nightly round.  Today's Vitals   09/20/20 1937 09/20/20 2034 09/20/20 2313 09/20/20 2357  BP: 130/82  120/78   Pulse: 90 86 76 77  Resp: (!) 22 20 17 18   Temp:   98.4 F (36.9 C)   TempSrc:   Oral   SpO2: 100% 100% 98% 98%  Weight:      PainSc:         Carollee Leitz, MD 09/21/2020, 2:14 AM PGY-2, [place] Service pager [phone]

## 2020-09-22 LAB — CBC WITH DIFFERENTIAL/PLATELET
Abs Immature Granulocytes: 0 10*3/uL (ref 0.00–0.07)
Basophils Absolute: 0 10*3/uL (ref 0.0–0.1)
Basophils Relative: 0 %
Eosinophils Absolute: 0.1 10*3/uL (ref 0.0–0.5)
Eosinophils Relative: 2 %
HCT: 37.3 % (ref 36.0–46.0)
Hemoglobin: 11.7 g/dL — ABNORMAL LOW (ref 12.0–15.0)
Immature Granulocytes: 0 %
Lymphocytes Relative: 34 %
Lymphs Abs: 1.4 10*3/uL (ref 0.7–4.0)
MCH: 25.9 pg — ABNORMAL LOW (ref 26.0–34.0)
MCHC: 31.4 g/dL (ref 30.0–36.0)
MCV: 82.7 fL (ref 80.0–100.0)
Monocytes Absolute: 0.3 10*3/uL (ref 0.1–1.0)
Monocytes Relative: 9 %
Neutro Abs: 2.2 10*3/uL (ref 1.7–7.7)
Neutrophils Relative %: 55 %
Platelets: 148 10*3/uL — ABNORMAL LOW (ref 150–400)
RBC: 4.51 MIL/uL (ref 3.87–5.11)
RDW: 14.9 % (ref 11.5–15.5)
WBC: 4 10*3/uL (ref 4.0–10.5)
nRBC: 0 % (ref 0.0–0.2)

## 2020-09-22 LAB — BASIC METABOLIC PANEL
Anion gap: 9 (ref 5–15)
BUN: 23 mg/dL (ref 8–23)
CO2: 27 mmol/L (ref 22–32)
Calcium: 9.9 mg/dL (ref 8.9–10.3)
Chloride: 101 mmol/L (ref 98–111)
Creatinine, Ser: 0.63 mg/dL (ref 0.44–1.00)
GFR, Estimated: >60 mL/min (ref >60)
Glucose, Bld: 167 mg/dL — ABNORMAL HIGH (ref 70–99)
Potassium: 3.8 mmol/L (ref 3.5–5.1)
Sodium: 137 mmol/L (ref 135–145)

## 2020-09-22 LAB — MAGNESIUM: Magnesium: 2.4 mg/dL (ref 1.7–2.4)

## 2020-09-22 MED ORDER — ENOXAPARIN SODIUM 40 MG/0.4ML IJ SOSY
40.0000 mg | PREFILLED_SYRINGE | INTRAMUSCULAR | Status: DC
  Administered 2020-09-23 – 2020-10-07 (×14): 40 mg via SUBCUTANEOUS
  Filled 2020-09-22 (×16): qty 0.4

## 2020-09-22 MED ORDER — GUAIFENESIN 100 MG/5ML PO SOLN: 5.0000 mL | Freq: Three times a day (TID) | ORAL | Status: DC

## 2020-09-22 MED ORDER — GUAIFENESIN 100 MG/5ML PO SOLN
5.0000 mL | Freq: Three times a day (TID) | ORAL | Status: AC
  Administered 2020-09-22 – 2020-09-24 (×5): 100 mg
  Filled 2020-09-22 (×5): qty 5

## 2020-09-22 NOTE — Plan of Care (Signed)

## 2020-09-22 NOTE — Progress Notes (Signed)
Physical Therapy Treatment Patient Details Name: Stacy Moran MRN: [id number] DOB: [birth year] Today's Date: 09/22/2020    History of Present Illness 65 y/o female presented to ED on 5/31 with generalized waekness and L arm pain/weakness s/p fall down the stairs. 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 Radiographs negative for acute fxs. PMH: SAH s/p PICA aneurysm clipping, bilateral SDH, seizures, chronic tracheostomy, PEG, seizures, HTN, GERD, anxiety    PT Comments    Pt mobilizing well today though continues to need supervision for safety as she does not attend to details such as her feet getting outside of RW and is not aware when she is veering L. Pt ascended and descended 5 steps with B rails. PT will continue to follow.    Follow Up Recommendations  SNF     Equipment Recommendations  None recommended by PT    Recommendations for Other Services       Precautions / Restrictions Precautions Precautions: Fall Restrictions Weight Bearing Restrictions: No    Mobility  Bed Mobility Overal bed mobility: Needs Assistance Bed Mobility: Supine to Sit     Supine to sit: Modified independent (Device/Increase time)          Transfers Overall transfer level: Needs assistance Equipment used: Rolling walker (2 wheeled);None Transfers: Sit to/from Stand Sit to Stand: Min guard;Supervision         General transfer comment: pt able to stand with supervision from bed to RW and from low toilet with use of hand rail. Also practiced sit<>stand with HHA to simulate use of cane  Ambulation/Gait Ambulation/Gait assistance: Min guard Gait Distance (Feet): 400 Feet Assistive device: Rolling walker (2 wheeled) Gait Pattern/deviations: Shuffle;Decreased stride length;Step-through pattern;Trunk flexed Gait velocity: decr Gait velocity interpretation: 1.31 - 2.62 ft/sec, indicative of limited community  ambulator General Gait Details: vc's for feet inside RW with turning and upright posture. Veers left at times   Stairs Stairs: Yes Stairs assistance: Supervision Stair Management: Two rails;Alternating pattern;Forwards Number of Stairs: 5 General stair comments: supervision for safety, did well with B rails   Wheelchair Mobility    Modified Rankin (Stroke Patients Only)       Balance Overall balance assessment: Needs assistance Sitting-balance support: Feet supported Sitting balance-Leahy Scale: Good   Postural control: Posterior lean;Left lateral lean Standing balance support: Bilateral upper extremity supported Standing balance-Leahy Scale: Poor Standing balance comment: needs unilateral support for safety                            Cognition Arousal/Alertness: Awake/alert Behavior During Therapy: Impulsive Overall Cognitive Status: History of cognitive impairments - at baseline Area of Impairment: Safety/judgement;Problem solving;Following commands                       Following Commands: Follows one step commands inconsistently;Follows one step commands with increased time Safety/Judgement: Decreased awareness of deficits;Decreased awareness of safety Awareness: Intellectual Problem Solving: Difficulty sequencing;Requires verbal cues;Requires tactile cues General Comments: Lacks insight into deficits, chooses to do things unsafely at times.      Exercises General Exercises - Lower Extremity Ankle Circles/Pumps: AROM;Both;15 reps;Supine Heel Slides: AROM;Both;10 reps;Supine    General Comments General comments (skin integrity, edema, etc.): HR 112-114 bpm, SPO2 98% on RA      Pertinent Vitals/Pain Pain Assessment: No/denies pain    Home Living  Prior Function            PT Goals (current goals can now be found in the care plan section) Acute Rehab PT Goals Patient Stated Goal: patient would like to go  home PT Goal Formulation: With patient Time For Goal Achievement: 09/24/20 Potential to Achieve Goals: Good Progress towards PT goals: Progressing toward goals    Frequency    Min 3X/week      PT Plan Current plan remains appropriate    Co-evaluation              AM-PAC PT "6 Clicks" Mobility   Outcome Measure  Help needed turning from your back to your side while in a flat bed without using bedrails?: None Help needed moving from lying on your back to sitting on the side of a flat bed without using bedrails?: None Help needed moving to and from a bed to a chair (including a wheelchair)?: A Little Help needed standing up from a chair using your arms (e.g., wheelchair or bedside chair)?: A Little Help needed to walk in hospital room?: A Little Help needed climbing 3-5 steps with a railing? : A Lot 6 Click Score: 19    End of Session Equipment Utilized During Treatment: Gait belt Activity Tolerance: Patient tolerated treatment well Patient left: with call bell/phone within reach;in chair;with chair alarm set Nurse Communication: Mobility status PT Visit Diagnosis: Other abnormalities of gait and mobility (R26.89);Unsteadiness on feet (R26.81);Difficulty in walking, not elsewhere classified (R26.2);Other symptoms and signs involving the nervous system (R29.898)     Time: 7981-0254 PT Time Calculation (min) (ACUTE ONLY): 30 min  Charges:  $Gait Training: 23-37 mins                     [place]  Pager [phone] Office [place] 09/22/2020, 11:43 AM

## 2020-09-22 NOTE — Progress Notes (Signed)
Spoke with Dr. Redmond Baseman who is agreeable to seeing the patient in the hospital for further evaluation. Overall clinical picture at this time with some of the assessments from speech therapy do not seem promising for having a safe diet but he will come by to evaluate.  Cejay Cambre, DO

## 2020-09-22 NOTE — Progress Notes (Signed)
Palliative-   Patient stable for discharge. MOST form in [facility] with future intervention desires if patient to decline.   Awaiting placement at SNF.   At this time no further inpatient Palliative needs. Will sign off.  Please feel free to reconsult if assistance with acute medical decision making is needing during this stay.   Mariana Kaufman, AGNP-C Palliative Medicine  Please call Palliative Medicine team phone with any questions [phone]. For individual providers please see AMION.  No charge note

## 2020-09-22 NOTE — Progress Notes (Addendum)
Called back by Dr. Redmond Baseman to discuss patient.  Dr. Redmond Baseman stated that patient has previously been seen by Dr. Constance Holster with possible plans for going to the OR.  He does not feel that it would be of benefit to the patient for him to jump in in the middle of her care as he is not the one who has been following her.  As patient is not likely to be leaving in the next few days plan will be for Dr. Constance Holster to be notified and hopefully come see patient in the hospital early next week.  Stacy Ditmer, DO

## 2020-09-22 NOTE — Progress Notes (Signed)
Family Medicine Teaching Service Daily Progress Note Intern Pager: [phone]  Patient name: Stacy Moran Medical record number: [id number] Date of birth: 10-20-1955 Age: 65 y.o. Gender: female  Primary Care Provider: [name], MD Consultants: Palliative Code Status: DNR  Pt Overview and Major Events to Date:  05/31: Admitted to FPTS   Assessment and Plan: Ms. Hirsch is a 65 year old female who presented after fall and now awaiting SNF placement.  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  Fall  Agitation Patient remains comfortable and has not require restraints for quite a while. - Continue delirium and fall precautions - Continue Seroquel BID  Constipation Patient states that she has had a BM in the last 1-2 days. - Continue miralax BID - Continue senna  Dysphagia  PEG tube dependent SLP signed off as there can be no improvement unless evaluated by ENT with possible intervention - Continue tube feeds - Consulting ENT   FEN/GI: NPO with PEG feeds PPx: Subcutaneous heparin, consider switch to Lovenox Dispo:SNF  pending SNF placement . Barriers include SNF placement.   Subjective:  Patient reports that she feels okay today.  She states that she had a bowel movement in the last day or so.  She does have a little bit abdominal pain from where she has been getting heparin states  Objective: Temp:  [98 F (36.7 C)-98.6 F (37 C)] 98.1 F (36.7 C) (06/27 0425) Pulse Rate:  [74-87] 78 (06/27 0810) Resp:  [18-19] 18 (06/27 0810) BP: (117-136)/(75-82) 117/82 (06/27 0425) SpO2:  [97 %-100 %] 99 % (06/27 0810) FiO2 (%):  [21 %] 21 % (06/27 0810) Physical Exam: General: NAD, supine in bed, appears very comfortable and is pleasant Cardiovascular: RRR, no murmur appreciated Respiratory: Breathing comfortably on room air, tracheostomy In place, no increased  work of breathing Abdomen: Some firmness palpated in the left lower quadrant, nondistended, minimal tenderness at sites of heparin injections Extremities: Moving all extremities equally and appropriately  Laboratory: Recent Labs  Lab 09/22/20 0016  WBC 4.0  HGB 11.7*  HCT 37.3  PLT 148*   Recent Labs  Lab 09/22/20 0016  NA 137  K 3.8  CL 101  CO2 27  BUN 23  CREATININE 0.63  CALCIUM 9.9  GLUCOSE 167*     Imaging/Diagnostic Tests: No results found.   Rise Patience, DO 09/22/2020, 8:54 AM PGY-1, [place] Intern pager: [phone], text pages welcome

## 2020-09-22 NOTE — Progress Notes (Signed)
[facility] as patient has been evaluated by Dr. Constance Holster previously in the outpatient setting on 08/07/2020.  On-call physician Dr. Redmond Baseman is currently in surgery with I will place a consult to him per request from speech therapy.   Stacy Texidor, DO

## 2020-09-23 NOTE — Progress Notes (Signed)
Physical Therapy Treatment Patient Details Name: Stacy Moran MRN: [id number] DOB: May 24, 1955 Today's Date: 09/23/2020    History of Present Illness 65 y/o female presented to ED on 5/31 with generalized waekness and L arm pain/weakness s/p fall down the stairs. 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 Radiographs negative for acute fxs. PMH: SAH s/p PICA aneurysm clipping, bilateral SDH, seizures, chronic tracheostomy, PEG, seizures, HTN, GERD, anxiety    PT Comments    Pt received in bed and sleepy this afternoon. Pt did well with RW yesterday so used cane for ambulation today however, pt unable to manage cane safely and needed min HHA on other side as well as fatiguing more quickly. Pt educated in continued use of RW. Worked on changes of speed and direction with ambulation, this continues to be difficult for pt. SPO2 100% on RA, HR 100 bpm after ambulation. PT will continue to follow.    Follow Up Recommendations  SNF     Equipment Recommendations  None recommended by PT    Recommendations for Other Services       Precautions / Restrictions Precautions Precautions: Fall Precaution Comments: trach, PEG Restrictions Weight Bearing Restrictions: No    Mobility  Bed Mobility Overal bed mobility: Needs Assistance Bed Mobility: Supine to Sit;Sit to Supine     Supine to sit: Min assist Sit to supine: Supervision   General bed mobility comments: pt not initiating supine to sit with vc's, needed min A to initiate. Returned to supine after walking with supervision and vc's    Transfers Overall transfer level: Needs assistance Equipment used: Rolling walker (2 wheeled);Straight cane Transfers: Sit to/from Stand Sit to Stand: Min guard         General transfer comment: sit>stand with cane with min guard A and vc's for effective use of cane. Pt sometimes holds it off the  ground  Ambulation/Gait Ambulation/Gait assistance: Min assist Gait Distance (Feet): 200 Feet Assistive device: Straight cane Gait Pattern/deviations: Shuffle;Decreased stride length;Step-through pattern;Trunk flexed;Drifts right/left Gait velocity: decr Gait velocity interpretation: 1.31 - 2.62 ft/sec, indicative of limited community ambulator General Gait Details: pt required min L HHA with use of cane in R hand due to decreased stability. Educated her that we could practice with cane in therapy but with nursing want her to continue to use RW for safety. Worked on changes in gait speed but pt was unable to achieve this.   Stairs             Wheelchair Mobility    Modified Rankin (Stroke Patients Only)       Balance Overall balance assessment: Needs assistance Sitting-balance support: Feet supported Sitting balance-Leahy Scale: Good   Postural control: Left lateral lean Standing balance support: Single extremity supported Standing balance-Leahy Scale: Fair Standing balance comment: able to maintain static standing without UE support but unable to accept challenges                            Cognition Arousal/Alertness: Awake/alert Behavior During Therapy: Impulsive Overall Cognitive Status: History of cognitive impairments - at baseline Area of Impairment: Safety/judgement;Problem solving;Following commands                       Following Commands: Follows one step commands inconsistently;Follows one step commands with increased time Safety/Judgement: Decreased awareness of deficits;Decreased awareness of safety Awareness: Intellectual Problem Solving: Difficulty sequencing;Requires  verbal cues;Requires tactile cues General Comments: decreased safety awareness and insight, especially to obstacles on L side of body      Exercises      General Comments General comments (skin integrity, edema, etc.): SPO2 100% on RA, HR 110 bpm after  ambulation      Pertinent Vitals/Pain Pain Assessment: No/denies pain    Home Living                      Prior Function            PT Goals (current goals can now be found in the care plan section) Acute Rehab PT Goals Patient Stated Goal: patient would like to go home PT Goal Formulation: With patient Time For Goal Achievement: 09/24/20 Potential to Achieve Goals: Good Progress towards PT goals: Progressing toward goals    Frequency    Min 3X/week      PT Plan Current plan remains appropriate    Co-evaluation              AM-PAC PT "6 Clicks" Mobility   Outcome Measure  Help needed turning from your back to your side while in a flat bed without using bedrails?: None Help needed moving from lying on your back to sitting on the side of a flat bed without using bedrails?: None Help needed moving to and from a bed to a chair (including a wheelchair)?: A Little Help needed standing up from a chair using your arms (e.g., wheelchair or bedside chair)?: A Little Help needed to walk in hospital room?: A Little Help needed climbing 3-5 steps with a railing? : A Little 6 Click Score: 20    End of Session Equipment Utilized During Treatment: Gait belt Activity Tolerance: Patient tolerated treatment well Patient left: with call bell/phone within reach;in bed;with bed alarm set Nurse Communication: Mobility status PT Visit Diagnosis: Other abnormalities of gait and mobility (R26.89);Unsteadiness on feet (R26.81);Difficulty in walking, not elsewhere classified (R26.2);Other symptoms and signs involving the nervous system (R29.898)     Time: 5035-4656 PT Time Calculation (min) (ACUTE ONLY): 33 min  Charges:  $Gait Training: 23-37 mins                     [place]  Pager [phone] Office [place] 09/23/2020, 4:00 PM

## 2020-09-23 NOTE — Progress Notes (Signed)
Occupational Therapy Treatment- Discharge  Patient Details Name: Stacy Moran MRN: [id number] DOB: Mar 30, 1955 Today's Date: 09/23/2020    History of present illness 65 y/o female presented to ED on 5/31 with generalized waekness and L arm pain/weakness s/p fall down the stairs. 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 Radiographs negative for acute fxs. PMH: SAH s/p PICA aneurysm clipping, bilateral SDH, seizures, chronic tracheostomy, PEG, seizures, HTN, GERD, anxiety   OT comments  Pt seen following last assessment/treatment by OT and continues to demo's impairments cognitively of safety, insight and processing. Pleasant and agreeable to session with encouragement but d/t limited insight and tendency for impulsive movements/behaviors pt continues to require SBA-min guard for transfers and standing tasks. Demo's Sup for familiar self care tasks in sitting after set up, but acutely anticipate pt has maximized therapeutic outcome at this time. Pt would benefit from post acute therapy with 24hr S/A, OT will sign off at this time.     Follow Up Recommendations  SNF     Equipment Recommendations  None recommended by OT    Recommendations for Other Services      Precautions / Restrictions Precautions Precautions: Fall Restrictions Weight Bearing Restrictions: No       Mobility Bed Mobility   Bed Mobility: Supine to Sit;Sit to Supine     Supine to sit: Modified independent (Device/Increase time)          Transfers Overall transfer level: Needs assistance Equipment used: Rolling walker (2 wheeled);None Transfers: Sit to/from Stand Sit to Stand: Min guard;Supervision (with cues for safety d/t tendency for impulsiveness)              Balance     Sitting balance-Leahy Scale: Good     Standing balance support: Single extremity supported Standing balance-Leahy Scale: Fair Standing balance comment:  unable to tolerated unsupported standing.                           ADL either performed or assessed with clinical judgement   ADL       Grooming: Supervision/safety;Wash/dry hands;Wash/dry face Grooming Details (indicate cue type and reason): seated EOB this date         Upper Body Dressing : Min guard;Sitting Upper Body Dressing Details (indicate cue type and reason): cues for sequencing                         Vision       Perception     Praxis      Cognition Arousal/Alertness: Awake/alert Behavior During Therapy: Impulsive Overall Cognitive Status: History of cognitive impairments - at baseline Area of Impairment: Safety/judgement;Problem solving;Following commands                       Following Commands: Follows one step commands inconsistently;Follows one step commands with increased time Safety/Judgement: Decreased awareness of deficits;Decreased awareness of safety Awareness: Intellectual Problem Solving: Difficulty sequencing;Requires verbal cues;Requires tactile cues General Comments: decreased safety awareness and insight        Exercises     Shoulder Instructions       General Comments      Pertinent Vitals/ Pain       Pain Assessment: No/denies pain  Home Living  Prior Functioning/Environment              Frequency   (discharge from services)        Progress Toward Goals  OT Goals(current goals can now be found in the care plan section)  Progress towards OT goals: Not progressing toward goals - comment (pt at this time has achieve goals to ana ppropraite level for d/c from acute OT services. limited primarily by cognitive status pt is performing goals at SBA-Sup level and anticipate unsafe for advancing past that d/t safety/impulsivity.)  Acute Rehab OT Goals OT Goal Formulation: All assessment and education complete, DC therapy  Plan  Discharge plan remains appropriate;Frequency needs to be updated    Co-evaluation                 AM-PAC OT "6 Clicks" Daily Activity     Outcome Measure   Help from another person eating meals?: None Help from another person taking care of personal grooming?: A Little Help from another person toileting, which includes using toliet, bedpan, or urinal?: A Little Help from another person bathing (including washing, rinsing, drying)?: A Little Help from another person to put on and taking off regular upper body clothing?: None Help from another person to put on and taking off regular lower body clothing?: A Little 6 Click Score: 20    End of Session Equipment Utilized During Treatment: Rolling walker  OT Visit Diagnosis: Unsteadiness on feet (R26.81);History of falling (Z91.81);Other symptoms and signs involving cognitive function Pain - Right/Left: Left Pain - part of body: Hand   Activity Tolerance Patient tolerated treatment well   Patient Left in bed;with bed alarm set   Nurse Communication Mobility status     Time: 3893-7342 OT Time Calculation (min): 14 min  Charges: OT General Charges $OT Visit: 1 Visit OT Treatments $Self Care/Home Management : 8-22 mins  Khup Sapia OTR/L acute rehab services Office: [phone]  09/23/2020, 2:12 PM

## 2020-09-23 NOTE — Progress Notes (Addendum)
Family Medicine Teaching Service Daily Progress Note Intern Pager: [phone]  Patient name: Stacy Moran Medical record number: [id number] Date of birth: 04/10/1955 Age: 65 y.o. Gender: female  Primary Care Provider: [name], MD Consultants: None Code Status: DNR  Pt Overview and Major Events to Date:  05/31: Admitted to FPTS   Assessment and Plan: Stacy Moran is a 65 year old female who presented after fall and now awaiting SNF placement.  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   Fall  Agitation  Dementia No falls since admission, out of restraints for quite some time, easily directed. Awaiting SNF placement and is stable for discharge. Expressed dissatisfaction with DNR bracelet, would like to talk with her daughter. - Continue delirium and fall precautions - Continue Seroquel BID - Will discuss with daughter about DNR status and recommend visit in person   Dysphagia  PEG tube dependent ENT consulted on 6/27 with notes from outpatient considering OR procedure for dysphagia. Dr. Constance Holster saw her in the outpatient setting and will be back to evaluate hopefully next week  - Continue tube feeds - ENT to follow-up inpatient next week if pt still in hospital   FEN/GI: NPO with PEG feeds PPx: Subcutaneous heparin, consider switch to Lovenox Dispo:SNF  SNF placement .   Subjective:  Patient reports that she feels okay this morning.  She did point out her DNR bracelet saying that she did not want that.  Objective: Temp:  [97.6 F (36.4 C)-99.4 F (37.4 C)] 97.8 F (36.6 C) (06/28 0744) Pulse Rate:  [75-91] 91 (06/28 0820) Resp:  [16-20] 20 (06/28 0820) BP: (108-134)/(67-91) 125/91 (06/28 0744) SpO2:  [98 %-100 %] 98 % (06/28 0820) FiO2 (%):  [21 %] 21 % (06/28 0820) Physical Exam: General: NAD, sitting up in bed attempting to put socks on Cardiovascular:  RRR Respiratory: Breathing comfortably on room air, tracheostomy collar around her neck but not providing oxygen no increased work of breathing Abdomen: Soft, nontender, nondistended Extremities: Moving all extremities equally and appropriately, no tenderness to palpation of calves  Laboratory: Recent Labs  Lab 09/22/20 0016  WBC 4.0  HGB 11.7*  HCT 37.3  PLT 148*   Recent Labs  Lab 09/22/20 0016  NA 137  K 3.8  CL 101  CO2 27  BUN 23  CREATININE 0.63  CALCIUM 9.9  GLUCOSE 167*     Imaging/Diagnostic Tests: No results found.   Rise Patience, DO 09/23/2020, 8:42 AM PGY-1, [place] Intern pager: [phone], text pages welcome

## 2020-09-23 NOTE — Plan of Care (Signed)

## 2020-09-24 NOTE — Progress Notes (Signed)
Nutrition Follow-up  DOCUMENTATION CODES:  Not applicable  INTERVENTION:  -Continue 355 ml Osmolite 1.2 QID via PEG -Continue 45 ml Prosource TF BID via PEG -Continue 200 ml free water every 6 hours via PEG  Complete regimen provides 1784 kcals, 100 grams protein, and 1970 ml free water daily, meeting 100% of estimated kcal and protein needs  NUTRITION DIAGNOSIS:  Inadequate oral intake related to inability to eat as evidenced by NPO status. 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 She complains of residual reduction in bilateral grip strength. Daughter [name], works at [employer]) provides additional history. The patient has poor sleep and becomes agitated and confused at night at times. This morning, despite multiple home measures include alarms and lights, the patient fell down the stairs. Denies headaches, chest pain, abdominal pain, endorses only hand pain.  Pt admitted with acute on chronic SDH. Pt continues to be stable for discharge per MD, awaiting placement.   Pt remains on TC and is tolerating TF via PEG per RN. Current regimen: 355 ml Osmolite 1.2 QID, 45 ml Prosource Plus BID, and 200 ml free water flush 4 times daily. Complete regimen provides 1784 kcals, 100 grams protein, and 1964 ml free water daily, meeting 100% of estimated kcal and protein needs. Continue current nutrition plan of care. MD noted discussion with SLP as follows: "...without ENT a diet would not be safe and there is a very high risk given patient had  significant coughing/choking with prior evaluation with swallowing. Patient would possibly benefit from a re-evaluation once ENT evaluates or if family feels it would be more aligned with her goals of care." Plan to continue tube feeds. ENT to follow-up.   UOP: 4x unmeasured occurrences x24 hours  Admit wt: 55.5 kg Current wt: 59.7 kg (last updated 6/21)  Medications:  amLODipine  10 mg Per Tube Daily   chlorhexidine  15 mL Mouth Rinse BID   Chlorhexidine Gluconate Cloth  6 each Topical Daily   enoxaparin (LOVENOX) injection  40 mg Subcutaneous Q24H   feeding supplement (OSMOLITE 1.2 CAL)  355 mL Per Tube TID WC & HS   feeding supplement (PROSource TF)  45 mL Per Tube BID   free water  200 mL Per Tube Q6H   levETIRAcetam  750 mg Per Tube BID   levothyroxine  100 mcg Per Tube Q0600   mouth rinse  15 mL Mouth Rinse q12n4p   polyethylene glycol  17 g Per Tube BID   QUEtiapine  50 mg Per Tube BID   senna  1 tablet Per Tube Daily   sodium chloride flush  3 mL Intravenous Once   Labs: Recent Labs  Lab 09/22/20 0016 09/22/20 0327  NA 137  --   K 3.8  --   CL 101  --   CO2 27  --   BUN 23  --   CREATININE 0.63  --   CALCIUM 9.9  --   MG  --  2.4  GLUCOSE 167*  --    Diet Order:  Diet Order             Diet NPO time specified  Diet effective now                  EDUCATION NEEDS:  Not appropriate for education at this time  Skin:  Skin Assessment: Reviewed RN Assessment  Last BM:  6/27 type 7  Height:  Ht Readings from Last 1 Encounters:  07/29/20 5' 6" (1.676 m)   Weight:  Wt Readings from Last 1 Encounters:  09/16/20 59.7 kg   Ideal Body Weight:  59.1 kg  BMI:  Body mass index is 21.24 kg/m.  Estimated Nutritional Needs:  Kcal:  9798-9211 Protein:  85-100 grams Fluid:  > 1.7 L   Larkin Ina, MS, RD, LDN RD pager number and weekend/on-call pager number located in [place].

## 2020-09-24 NOTE — Progress Notes (Signed)
Went to bedside to speak with daughter and patient regarding NPO status and patient's request to have something orally. Daughter is still concerned about the risks but does think that there would be some benefit to trying again.  Patient has been n.p.o. since November when her tracheostomy was done, and has been wanting to have something by mouth since it has been that time.  We discussed that if we attempted to do something there would always be risks with her aspirating which can cause issues with the lungs and pneumonia.  If patient was to be reevaluated by speech and may be determined to be very dangerous for the patient to try and taking orally then the family would not wanted but might be willing to accept some small risk if she was able to tolerate any intake orally.  Family is now aware that ENT will come by to evaluate the patient hopefully next week and then will also be a time when we can make a decision.  Patient was very lively when her daughter got there and conversation was going well.  Patient was also getting PEG with tube feeding from the nurse at this time.    Stacy Clippinger, DO

## 2020-09-24 NOTE — Progress Notes (Signed)
Family Medicine Teaching Service Daily Progress Note Intern Pager: [phone]  Patient name: Stacy Moran Medical record number: [id number] Date of birth: September 27, 1955 Age: 66 y.o. Gender: female  Primary Care Provider: [name], MD Consultants: ENT (to evaluate week of July 4) Code Status: DNR  Pt Overview and Major Events to Date:  05/31: Admitted to FPTS   Assessment and Plan: Stacy Moran is a 65 year old female who presented after fall and now awaiting SNF placement.  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   Fall  Agitation  Dementia No falls since admission, out of restraints for quite some time, easily directed. Awaiting SNF placement and is stable for discharge.  - Continue delirium and fall precautions - Continue Seroquel BID - Will discuss with daughter about DNR status and recommend visit in person   Dysphagia  PEG tube dependent Discussed with speech therapy that without ENT the diet would not be safe and there is a very high risk given patient had significant coughing/choking with prior evaluation with swallowing. Patient would possibly benefit from a re-evaluation once ENT evaluates or if family feels it would be more aligned with her goals of care. - Continue tube feeds - ENT to follow-up inpatient next week if pt still in hospital    FEN/GI: NPO: PEG tube feeds PPx: Lovenox Dispo:SNF    . Barriers include Awaiting placement at SNF (tracheostomy patient).   Subjective:  Patient reports no problems today and her daughter is supposed to be coming to the hospital.  Objective: Temp:  [97.5 F (36.4 C)-99 F (37.2 C)] 98.6 F (37 C) (06/29 0749) Pulse Rate:  [72-92] 72 (06/29 0910) Resp:  [16-20] 18 (06/29 0910) BP: (93-138)/(59-81) 123/81 (06/29 0749) SpO2:  [99 %-100 %] 100 % (06/29 0910) FiO2 (%):  [21 %] 21 % (06/29 0910) Physical  Exam: General: NAD, sitting up in bed, overall well-appearing Cardiovascular: RRR, no murmur appreciated Respiratory:, Breathing abdomen room air, tracheostomy collar not in place Abdomen: Soft, nontender, nondistended Extremities: Moving all extremities equally and appropriately  Laboratory: Recent Labs  Lab 09/22/20 0016  WBC 4.0  HGB 11.7*  HCT 37.3  PLT 148*   Recent Labs  Lab 09/22/20 0016  NA 137  K 3.8  CL 101  CO2 27  BUN 23  CREATININE 0.63  CALCIUM 9.9  GLUCOSE 167*    Imaging/Diagnostic Tests: No results found.   Stacy Patience, DO 09/24/2020, 9:17 AM PGY-1, [place] Intern pager: [phone], text pages welcome

## 2020-09-24 NOTE — Plan of Care (Signed)

## 2020-09-24 NOTE — Plan of Care (Signed)
  Problem: Clinical Measurements: Goal: Respiratory complications will improve Outcome: Progressing   Problem: Nutrition: Goal: Adequate nutrition will be maintained Outcome: Progressing   Problem: Elimination: Goal: Will not experience complications related to bowel motility Outcome: Progressing Goal: Will not experience complications related to urinary retention Outcome: Progressing   Problem: Pain Managment: Goal: General experience of comfort will improve Outcome: Progressing   Problem: Safety: Goal: Ability to remain free from injury will improve Outcome: Progressing   Problem: Skin Integrity: Goal: Risk for impaired skin integrity will decrease Outcome: Progressing

## 2020-09-24 NOTE — Progress Notes (Signed)
Patient does not have any bed offers at this time - TOC to continue following for discharge planning.  [name], MSW, LCSW Transitions of Care  Clinical Social Worker II [phone]

## 2020-09-25 NOTE — Progress Notes (Addendum)
Family Medicine Teaching Service Daily Progress Note Intern Pager: [phone]  Patient name: Stacy Moran Medical record number: [id number] Date of birth: 13-Jul-1955 Age: 65 y.o. Gender: female  Primary Care Provider: [name], MD Consultants: Palliative Code Status: DNR  Pt Overview and Major Events to Date:  05/31: Admitted to FPTS   Assessment and Plan: Stacy Moran is a 65 year old female who presented after fall and now awaiting SNF placement.  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   Dementia  SNF placement Awaiting SNF placement, stable for discharge to SNF once able. - Continue delirium and fall precautions - Continue Seroquel BID   Dysphagia  PEG tube dependent Discussed with family, patient daughter would like FEES test done again and to re-evaluate if able. Patient at risk for aspiration and family would like more details on the risks of trial of food if that is what they decide - Continue tube feeds - ENT to follow-up inpatient next week if pt still in hospital - Consulting speech and hopeful for possible FEES evaluation if appropriate  Constipation, resolved Last BM documented on 6/29. - Continue senna daily and Miralax BID     FEN/GI: NPO: PEG tube feeds PPx: Lovenox Dispo:SNF  awaiting placement . Barriers include tracheostomy status.   Subjective:  Patient was assisting okay this morning, has no complaints and no pain.  Is still requesting if she is agreeable to eat something today.  Objective: Temp:  [98.1 F (36.7 C)-99 F (37.2 C)] 98.1 F (36.7 C) (06/30 0734) Pulse Rate:  [79-104] 84 (06/30 0858) Resp:  [16-18] 17 (06/30 0858) BP: (117-133)/(69-96) 117/74 (06/30 0734) SpO2:  [98 %-100 %] 98 % (06/30 0858) FiO2 (%):  [21 %] 21 % (06/30 0858) Physical Exam: General: NAD, thin elderly female, well-appearing Cardiovascular:  RRR, no murmur appreciated, no peripheral edema  Respiratory: Breathing comfortably on room air, no increased work of breathing, clear to auscultation bilaterally Abdomen: Soft, nontender, nondistended, bowel sounds present Extremities: Moving all extremities equally and appropriately, no peripheral edema, no calf tenderness  Laboratory: Recent Labs  Lab 09/22/20 0016  WBC 4.0  HGB 11.7*  HCT 37.3  PLT 148*   Recent Labs  Lab 09/22/20 0016  NA 137  K 3.8  CL 101  CO2 27  BUN 23  CREATININE 0.63  CALCIUM 9.9  GLUCOSE 167*     Imaging/Diagnostic Tests: No results found.   Stacy Patience, DO 09/25/2020, 9:15 AM PGY-1, [place] Intern pager: [phone], text pages welcome

## 2020-09-25 NOTE — Progress Notes (Signed)
Called by RN to come assess trach placement. She said pt pulled trach out & she reinserted trach. Trach assessed & is in correct position. Placement confirmed by positive color change on EZcap detector & secretions suctioned via trach.

## 2020-09-25 NOTE — Progress Notes (Signed)
Called by NT that patient has pulled out trach. Patient panicking and reassured.Same trach re inserted without difficulties. RT made aware to come and assess patient. I will inform MD as well.

## 2020-09-25 NOTE — Progress Notes (Signed)
CSW expanded bed search for patient - clinicals faxed via [place].  [name], MSW, LCSW Transitions of Care  Clinical Social Worker II [phone]

## 2020-09-25 NOTE — Progress Notes (Addendum)
Bladder scan volume 350 ml. Patient asked to void and assisted to the bathroom. Repeat bladder scan volume 0 ml.

## 2020-09-26 NOTE — Progress Notes (Signed)
Physical Therapy Treatment Patient Details Name: Stacy Moran MRN: [id number] DOB: 19-Jul-1955 Today's Date: 09/26/2020    History of Present Illness 65 y/o female presented to ED on 5/31 with generalized waekness and L arm pain/weakness s/p fall down the stairs. 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 Radiographs negative for acute fxs. PMH: SAH s/p PICA aneurysm clipping, bilateral SDH, seizures, chronic tracheostomy, PEG, seizures, HTN, GERD, anxiety    PT Comments    Pt received in bed, agreeable to participation in therapy. She required min guard assist bed mobility, min guard assist transfers, and min to min guard assist ambulation 200' with RW. Mobilized on RA with PMV in place, SpO2 95-99%, max HR 107. Pt in recliner with feet elevated at end of session. Returned to trach collar, humidified air. Pt requested to leave PMV in place. Goals reviewed and updated as needed.    Follow Up Recommendations  SNF     Equipment Recommendations  None recommended by PT    Recommendations for Other Services       Precautions / Restrictions Precautions Precautions: Fall Precaution Comments: trach, PEG Restrictions Weight Bearing Restrictions: No    Mobility  Bed Mobility Overal bed mobility: Needs Assistance Bed Mobility: Supine to Sit     Supine to sit: HOB elevated;Min guard     General bed mobility comments: +rail, min guard for safety    Transfers Overall transfer level: Needs assistance Equipment used: Rolling walker (2 wheeled) Transfers: Sit to/from Stand Sit to Stand: Min guard         General transfer comment: cues for hand placement, increased time, min guard for safety  Ambulation/Gait Ambulation/Gait assistance: Min assist;Min guard Gait Distance (Feet): 200 Feet Assistive device: Rolling walker (2 wheeled) Gait Pattern/deviations: Step-through pattern;Drifts right/left;Decreased stride  length Gait velocity: decreased Gait velocity interpretation: 1.31 - 2.62 ft/sec, indicative of limited community ambulator General Gait Details: min guard for straight, open areas; min assist to maneuver around obstacles. Cues to stay inside frame of RW.   Stairs             Wheelchair Mobility    Modified Rankin (Stroke Patients Only)       Balance Overall balance assessment: Needs assistance Sitting-balance support: Feet supported;No upper extremity supported Sitting balance-Leahy Scale: Good     Standing balance support: During functional activity;Bilateral upper extremity supported;No upper extremity supported Standing balance-Leahy Scale: Fair Standing balance comment: static stand without UE support                            Cognition Arousal/Alertness: Awake/alert Behavior During Therapy: Impulsive;WFL for tasks assessed/performed Overall Cognitive Status: History of cognitive impairments - at baseline                                 General Comments: Pleasant and interactive. Decreased safety awareness and insight. Loves to wave/interact with staff/visitors in hallway.      Exercises      General Comments General comments (skin integrity, edema, etc.): mobilized on RA with PMV in place. SpO2 95-99%. HR 107      Pertinent Vitals/Pain Pain Assessment: No/denies pain    Home Living                      Prior Function  PT Goals (current goals can now be found in the care plan section) Acute Rehab PT Goals Patient Stated Goal: patient would like to go home PT Goal Formulation: With patient Time For Goal Achievement: 10/10/20 Potential to Achieve Goals: Good Progress towards PT goals: Progressing toward goals    Frequency    Min 3X/week      PT Plan Current plan remains appropriate    Co-evaluation              AM-PAC PT "6 Clicks" Mobility   Outcome Measure  Help needed turning from  your back to your side while in a flat bed without using bedrails?: None Help needed moving from lying on your back to sitting on the side of a flat bed without using bedrails?: A Little Help needed moving to and from a bed to a chair (including a wheelchair)?: A Little Help needed standing up from a chair using your arms (e.g., wheelchair or bedside chair)?: A Little Help needed to walk in hospital room?: A Little Help needed climbing 3-5 steps with a railing? : A Little 6 Click Score: 19    End of Session Equipment Utilized During Treatment: Gait belt Activity Tolerance: Patient tolerated treatment well Patient left: in chair;with call bell/phone within reach;with chair alarm set Nurse Communication: Mobility status PT Visit Diagnosis: Other abnormalities of gait and mobility (R26.89);Unsteadiness on feet (R26.81);Difficulty in walking, not elsewhere classified (R26.2);Other symptoms and signs involving the nervous system (J17.915)     Time: 0569-7948 PT Time Calculation (min) (ACUTE ONLY): 17 min  Charges:  $Gait Training: 8-22 mins                     Lorrin Goodell, PT  Office # [phone] Pager [phone]    [name] 09/26/2020, 10:30 AM

## 2020-09-26 NOTE — Progress Notes (Signed)
Family Medicine Teaching Service Daily Progress Note Intern Pager: [phone]  Patient name: Stacy Moran Medical record number: [id number] Date of birth: 06-Sep-1955 Age: 65 y.o. Gender: female  Primary Care Provider: [name], MD Consultants: Palliative, Speech, ENT (Dr. Constance Holster)  Code Status: DNR  Pt Overview and Major Events to Date:  05/31- admitted  Assessment and Plan:  Stacy Moran is a 65 year old female who presented after fall and now awaiting SNF placement.  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  Dementia  SNF Patient is stable for discharge to SNF once a bed comes available  - Grateful to CSW for their help with placement - Continue seroquel BID and delirium/fall precautions    Dysphagia  PEG tube dependent She continues to request a diet. Her daughter is coming in today to continue discussion of risks/benefits of having a diet given her significant aspiration risk - Consult placed to Speech for FEES study. - Dr. Constance Holster (ENT) to see next week if still here  - Continue with feeds per tube   HTN Well-controlled. 121/88 today - Continue amlodipine 10mg  daily  History of seizures s/p ICH, stable   - Continue Keppra per tube  Constipation, resolved Patient constipation during hospitalization.  1 stool occurrence documented in chart from yesterday. - Continue MiraLAX and senna.  Hypothyroidism, chronic, stable - Continue Synthroid   FEN/GI: Feedings per tube PPx: Lovenox  Dispo:SNF  once bed available . Barriers include awaiting placement.   Subjective:  Received report from the NT and RN that she pulled her trach out two or three times yesterday and subsequently became agitated. Stacy Moran denies any pain today. Continues to request a diet.   Objective: Temp:  [97.9 F (36.6 C)-98.8 F (37.1 C)] 97.9 F (36.6 C) (07/01 0327) Pulse  Rate:  [80-92] 84 (07/01 0327) Resp:  [16-18] 18 (07/01 0327) BP: (113-136)/(76-92) 114/84 (07/01 0327) SpO2:  [93 %-100 %] 100 % (07/01 0327) FiO2 (%):  [21 %] 21 % (07/01 0315) Physical Exam: General: Patient also alert sitting up in bed. Humidified air via trach Cardiovascular: Regular rate and rhythm, no murmurs, rubs, or gallops Respiratory: Lung sounds coarse throughout.  Exam limited by transmitted upper airway noises. HENT: trach site clean without drainage or surrounding erythema  Extremities: LE without edema or bruising Neuro: Tracks with eyes and responds appropriately to questions  Laboratory: Recent Labs  Lab 09/22/20 0016  WBC 4.0  HGB 11.7*  HCT 37.3  PLT 148*   Recent Labs  Lab 09/22/20 0016  NA 137  K 3.8  CL 101  CO2 27  BUN 23  CREATININE 0.63  CALCIUM 9.9  GLUCOSE 167*   Next labs due on 09/29/20  Imaging/Diagnostic Tests: No new labs or imaging  Stacy Gibson, MD 09/26/2020, 7:37 AM PGY-1, [place] Intern pager: [phone], text pages welcome

## 2020-09-26 NOTE — Progress Notes (Signed)
SLP Cancellation Note  Patient Details Name: Stacy Moran MRN: [id number] DOB: 1955-05-22   Cancelled treatment:       Reason Eval/Treat Not Completed: Other (comment). MD ordered FEES. 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 Would like to collaborate with ENT next week to determine what interventions may be beneficial for this pt as her swallowing and secretion management have not improved since November 2021 as evidenced by the 4 MBS and 1 FEES study and months of futile interventions from SLP. Attempted to discuss again with pts daughter, no response. Will call again later today.    Evelise Reine, [name] 09/26/2020, 12:32 PM

## 2020-09-27 NOTE — Progress Notes (Signed)
Family Medicine Teaching Service Daily Progress Note Intern Pager: [phone]  Patient name: Stacy Moran Medical record number: [id number] Date of birth: November 19, 1955 Age: 65 y.o. Gender: female  Primary Care Provider: [name], MD Consultants: ENT, SLP  Code Status: DNR   Pt Overview and Major Events to Date:  08/26/20: admitted   Assessment and Plan: Stacy Moran is a 66 y.o. female who initially presented after a fall resulting in acute on chronic subdural hematoma.  Patient mouth medically stable and awaiting SNF placement.  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  Dementia Patient reported to have history of dementia.  Mental status appears to be stable with them last few days of admission. No events overnight.  -Patient currently awaiting SNF placement -Patient to continue Seroquel 50 mg twice daily  -Continue delirium/fall precautions  Dysphagia  PEG tube dependent Patient and daughter requesting evaluation with hopes that patient will be able to tolerate oral feeds given her tracheostomy.  Plan now for ENT to evaluate patient and assess risk profile for oral feedings as well as work with SLP for evaluation. -Awaiting SLP recommendations for FEES study -Dr. Constance Holster, ENT, to evaluate patient in the next week -Continue tube feeds for now  - SLP will likely evaluate after ENT recommendations available.   Hypertension Blood pressure range overnight from 93-133/69-94. -Continue amlodipine 10 mg daily -Monitor blood pressure with vitals  History of seizures status post ICH, stable -Continue home Keppra 700 mg twice daily  Constipation Monitor bowel movements. -Continue senna and MiraLAX  FEN/GI: NPO, patient on tube feeds PPx: Lovenox 40 mg  Dispo:SNF  pending placement . Barriers include SNF  facility availability  Subjective:  Patient reports she is doing well this morning and has no new complaints.  Patient appears to be in good spirits.  Objective: Temp:  [98 F (36.7 C)-99 F (37.2 C)] 98.4 F (36.9 C) (07/02 0423) Pulse Rate:  [81-95] 87 (07/02 0552) Resp:  [15-18] 18 (07/02 0552) BP: (93-133)/(69-94) 133/94 (07/02 0423) SpO2:  [98 %-100 %] 98 % (07/02 0552) FiO2 (%):  [21 %] 21 % (07/02 0552)  Physical Exam: General: Thin, chronically ill-appearing female, lying in bed cheerful and smiling in no acute distress Cardiovascular: Regular rate and rhythm, pulses palpable radial Respiratory: Tracheostomy in place, no respiratory distress, normal work of breathing, no crackles appreciated on exam Abdomen: Soft, nontender, PEG tube in place Extremities: No lower extremity edema  Laboratory: Recent Labs  Lab 09/22/20 0016  WBC 4.0  HGB 11.7*  HCT 37.3  PLT 148*   Recent Labs  Lab 09/22/20 0016  NA 137  K 3.8  CL 101  CO2 27  BUN 23  CREATININE 0.63  CALCIUM 9.9  GLUCOSE 167*    Imaging/Diagnostic Tests: No new imaging studies.  Eulis Foster, MD 09/27/2020, 7:36 AM PGY-3, [place] Intern pager: [phone], text pages welcome

## 2020-09-27 NOTE — Progress Notes (Signed)
FPTS Brief Progress Note  S: Reported to bedside in order to evaluate patient for evening shift.  Patient resting comfortably no complaints at this time.  Discussed any new issues or requests with patient's RN, Nehemiah Settle.  RN states that patient has no new issues or request at this time.  Patient has been calm during this shift and has had no issues with her trach.   O: BP 93/73 (BP Location: Right Arm)   Pulse 92   Temp 98 F (36.7 C)   Resp 16   Wt 59.7 kg   SpO2 100%   BMI 21.24 kg/m   General: Patient sleeping comfortably in bed with lights off  A/P: Patient is a 65 year old female admitted for acute on chronic subdural hematoma.  Stable.  - Orders reviewed. Labs for AM not ordered, which was adjusted as needed.  Patient plan includes weekly labs. Patient awaiting ENT evaluation to assess for potential oral feeding. - If condition changes, plan includes working with respiratory therapy for any respiratory status changes. -Overall, patient stable and expected to discharge to SNF once bed available.  Eulis Foster, MD 09/27/2020, 2:37 AM PGY-3, [facility] Night Resident  Please page [phone] with questions.

## 2020-09-27 NOTE — Progress Notes (Signed)
FPTS Night Rounding Progress Note  S: Reported to bedside to check in on patient. As I enter she states, "you look different." I then explained to the patient that this is the first time she has met me and introduced myself. States that she is doing well and just trying to get some rest. Denies any concerns at this time. Checked in with nurse who also has no concerns and reports that she has been doing well, receiving all her intake via PEG tube.   O: BP 93/76 (BP Location: Right Arm)   Pulse 88   Temp 98.6 F (37 C) (Oral)   Resp 16   Wt 59.7 kg   SpO2 100%   BMI 21.24 kg/m   General: Patient resting comfortably in bed, in no acute distress. Resp: Normal work of breathing noted  A/P: 65 yr old female with past medical history including dementia and hypertension admitted for acute on chronic subarachnoid hemorrhage is currently pending SNF placement.  -Disposition issues in progress but awaiting ENT evaluation 7/4 for approval of oral intake, until then continue PEG tube dependence.  -remainder of plan same as previously in place   [place], Lovette Cliche, DO 09/27/2020, 9:43 PM PGY-2, [place] Service pager [phone]

## 2020-09-27 NOTE — Plan of Care (Signed)
  Problem: Education: Goal: Knowledge of General Education information will improve Description: Including pain rating scale, medication(s)/side effects and non-pharmacologic comfort measures Outcome: Progressing   Problem: Clinical Measurements: Goal: Ability to maintain clinical measurements within normal limits will improve Outcome: Progressing Goal: Will remain free from infection Outcome: Progressing Goal: Diagnostic test results will improve Outcome: Progressing Goal: Respiratory complications will improve Outcome: Progressing Goal: Cardiovascular complication will be avoided Outcome: Progressing   Problem: Nutrition: Goal: Adequate nutrition will be maintained Outcome: Progressing   Problem: Elimination: Goal: Will not experience complications related to bowel motility Outcome: Progressing Goal: Will not experience complications related to urinary retention Outcome: Progressing   Problem: Skin Integrity: Goal: Risk for impaired skin integrity will decrease Outcome: Progressing   Problem: Safety: Goal: Ability to remain free from injury will improve Outcome: Progressing

## 2020-09-28 NOTE — Progress Notes (Addendum)
Family Medicine Teaching Service Daily Progress Note Intern Pager: [phone]  Patient name: Stacy Moran Medical record number: [id number] Date of birth: 1955-12-15 Age: 65 y.o. 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   Dementia Patient interacting appropriately. Stable for  discharge to SNF when bed comes available.  - Grateful to CSW for help with placement - Continue Seroquel 50mg  BID - Fall and delirium precautions  Dysphagia  PEG tube dependent Continues to voice her desire to eat. Awaiting Dr. Janeice Robinson return next week to develop a plan for PO intake.  - Continue tube feeds - SLP to reevaluate after ENT recs on 7/4 or 7/5   Hypertension BP well-controlled on current regimen. One reading of systolic 035 overnight.  - Amlodipine 10mg  daily  History of seizures status post ICH, stable - Continue home Keppra 750mg  BID  Constipation, chronic Last BM charted on 06/30 but patient reports BM last night.  - Miralax and Senna daily  FEN/GI: NPO, feeds per tube PPx: Lovenox 40mg  Dispo:SNF  when bed comes available . Barriers include pending placement.   Subjective:  Stacy Moran reports that she is "good" this morning. No complaints at this time and no acute events overnight.  She is hopeful for a discharge to SNF soon.  Objective: Temp:  [98.3 F (36.8 C)-99.1 F (37.3 C)] 98.3 F (36.8 C) (07/03 0356) Pulse Rate:  [74-93] 77 (07/03 0356) Resp:  [15-20] 15 (07/03 0356) BP: (93-150)/(66-93) 150/83 (07/03 0356) SpO2:  [94 %-100 %] 100 % (07/03 0356) FiO2 (%):  [21 %] 21 % (07/03 0356) Physical Exam: General: Thin  older woman resting in bed comfortably Cardiovascular: Regular rate and rhythm, no murmurs rubs or gallops Respiratory: Tracheostomy in place receiving humidified air via trach, normal work of breathing, transmitted upper airway sounds on exam Abdomen: Soft nontender PEG tube in place with no surrounding erythema  Extremities: No LE edema Neuro: Alert; responds to all questions appropriately  Laboratory: Recent Labs  Lab 09/22/20 0016  WBC 4.0  HGB 11.7*  HCT 37.3  PLT 148*   Recent Labs  Lab 09/22/20 0016  NA 137  K 3.8  CL 101  CO2 27  BUN 23  CREATININE 0.63  CALCIUM 9.9  GLUCOSE 167*      Imaging/Diagnostic Tests: No new imaging studies  Eppie Gibson, MD 09/28/2020, 7:04 AM PGY-1, [place] Intern pager: [phone], text pages welcome

## 2020-09-29 LAB — BASIC METABOLIC PANEL
Anion gap: 7 (ref 5–15)
BUN: 21 mg/dL (ref 8–23)
CO2: 30 mmol/L (ref 22–32)
Calcium: 10 mg/dL (ref 8.9–10.3)
Chloride: 101 mmol/L (ref 98–111)
Creatinine, Ser: 0.54 mg/dL (ref 0.44–1.00)
GFR, Estimated: >60 mL/min (ref >60)
Glucose, Bld: 128 mg/dL — ABNORMAL HIGH (ref 70–99)
Potassium: 4.1 mmol/L (ref 3.5–5.1)
Sodium: 138 mmol/L (ref 135–145)

## 2020-09-29 LAB — CBC WITH DIFFERENTIAL/PLATELET
Abs Immature Granulocytes: 0.04 10*3/uL (ref 0.00–0.07)
Basophils Absolute: 0 10*3/uL (ref 0.0–0.1)
Basophils Relative: 0 %
Eosinophils Absolute: 0.1 10*3/uL (ref 0.0–0.5)
Eosinophils Relative: 1 %
HCT: 37.5 % (ref 36.0–46.0)
Hemoglobin: 11.9 g/dL — ABNORMAL LOW (ref 12.0–15.0)
Immature Granulocytes: 1 %
Lymphocytes Relative: 35 %
Lymphs Abs: 1.6 10*3/uL (ref 0.7–4.0)
MCH: 26.3 pg (ref 26.0–34.0)
MCHC: 31.7 g/dL (ref 30.0–36.0)
MCV: 82.8 fL (ref 80.0–100.0)
Monocytes Absolute: 0.4 10*3/uL (ref 0.1–1.0)
Monocytes Relative: 8 %
Neutro Abs: 2.5 10*3/uL (ref 1.7–7.7)
Neutrophils Relative %: 55 %
Platelets: 143 10*3/uL — ABNORMAL LOW (ref 150–400)
RBC: 4.53 MIL/uL (ref 3.87–5.11)
RDW: 14.6 % (ref 11.5–15.5)
WBC: 4.6 10*3/uL (ref 4.0–10.5)
nRBC: 0 % (ref 0.0–0.2)

## 2020-09-29 LAB — GLUCOSE, CAPILLARY: Glucose-Capillary: 103 mg/dL — ABNORMAL HIGH (ref 70–99)

## 2020-09-29 NOTE — Progress Notes (Signed)
Family Medicine Teaching Service Daily Progress Note Intern Pager: [phone]  Patient name: Stacy Moran Medical record number: [id number] Date of birth: 1955/08/24 Age: 65 y.o. Gender: female  Primary Care Provider: [name], MD Consultants: ENT Code Status: DNR  Pt Overview and Major Events to Date:   08/26/2020-admitted  Assessment and Plan:  Stacy Moran is a 65 year old female with a history of SAH PICA aneurysm, anemia, DM2, colostomy, and PEG who presents with acute SDH after a fall.  Dementia Patient is stable and able to communicate appropriately. - Currently awaiting SNF placement - Continue Seroquel 50mg  BID  Dysphagia PEG tube placement Patient is hoping to switch to oral feeding and understands the ENT evaluation is needed in order to be cleared for oral feeding. - Continue PEG feeding -Expected ENT evaluation today 7/4  Hypertension Stable blood pressure at 131/80 -Continue amlodipine 10 mg  History of seizure -Continue home Keppra 750 mg BID  Constipation Last BM 7/3 -Continue Miralax daily  FEN/GI: N.p.o., feeds per tube PPx: Lovenox 40 mg Dispo: SNF when bed available. Barriers include pending placement.   Subjective:  Stacy Moran says that she is doing well this morning.  She has no complaints of headache or dizziness.  She is looking forward to discharge.  Objective: Temp:  [98 F (36.7 C)-98.5 F (36.9 C)] 98 F (36.7 C) (07/04 0409) Pulse Rate:  [69-83] 69 (07/04 0445) Resp:  [15-18] 17 (07/04 0445) BP: (92-138)/(68-90) 92/68 (07/04 0445) SpO2:  [93 %-100 %] 93 % (07/04 0445) FiO2 (%):  [21 %] 21 % (07/04 0445) Physical Exam: General: Patient is in no distress. Laying comfortably supine Cardiovascular: Regular rate and rhythm,Normal S1/S2. No murmurs Respiratory: Clear lung sounds bilaterally, no wheezing. Abdomen: No abdominal pain or distention. Extremities: No edema bilaterally Neuro: Patient is alert and  coherent.  Laboratory: Recent Labs  Lab 09/29/20 0157  WBC 4.6  HGB 11.9*  HCT 37.5  PLT 143*   Recent Labs  Lab 09/29/20 0157  NA 138  K 4.1  CL 101  CO2 30  BUN 21  CREATININE 0.54  CALCIUM 10.0  GLUCOSE 128*      Imaging/Diagnostic Tests: No new images  Alen Bleacher, MD 09/29/2020, 7:31 AM PGY-1, [place] Intern pager: [phone], text pages welcome

## 2020-09-29 NOTE — Progress Notes (Signed)
FPTS Brief Progress Note  S:Pt sleeping comfortably. No complaints at this time.    O: BP 116/69 (BP Location: Left Arm)   Pulse 82   Temp 98.5 F (36.9 C) (Oral)   Resp 17   Wt 59.7 kg   SpO2 95%   BMI 21.24 kg/m   General: NAD, sleeping supine in bed Respiratory: normal work of breathing on trach, on 4L  A/P: - Orders reviewed. Labs for AM ordered, which was adjusted as needed.    Precious Gilding, DO 09/29/2020, 2:19 AM PGY-1, [facility] Night Resident  Please page [phone] with questions.

## 2020-09-29 NOTE — Plan of Care (Signed)

## 2020-09-30 NOTE — Progress Notes (Signed)
FPTS Brief Progress Note  S:Patient resting comfortably in bed.  She states she would like to eat. Spoke with pt about plan for ENT to evaluate this week. No other complaints.   O: BP 117/73 (BP Location: Right Arm)   Pulse 78   Temp 99.3 F (37.4 C) (Oral)   Resp 20   Wt 59.7 kg   SpO2 97%   BMI 21.24 kg/m   General: Pt awake and resting in bed comfortably, NAD  A/P: Pt is a 65 yo female admitted for acute on chronic subdural hematoma and is stable.  - Orders reviewed. Labs for AM not ordered, which was adjusted as needed. Weekly labs ordered. - If condition changes, plan includes working with respiratory therapy for any respiratory status changes.  -Pt is stable and plan to discharge to SNF once bed is available, ENT to evaluate this week.  Precious Gilding, DO 09/30/2020, 12:04 AM PGY-1, [facility] Night Resident  Please page [phone] with questions.

## 2020-09-30 NOTE — Progress Notes (Signed)
Family Medicine Teaching Service Daily Progress Note Intern Pager: [phone]  Patient name: Stacy Moran Medical record number: [id number] Date of birth: 1956/02/09 Age: 65 y.o. Gender: female  Primary Care Provider: [name], MD Consultants: ENT Code Status: DNR  Pt Overview and Major Events to Date:  08/26/20- Admitted  Assessment and Plan: Stacy Moran is a 65 year old female with a history of SAH PICA aneurysm, anemia, DM2, colostomy, and PEG who presents with acute SDH after a fall.  *Patient medically stable to discharge*  Dysphagia  Ostomy  PEG tube Currently NPO with PEG tube feeds.  Patient and daughter asking for re-evaluation by ENT for possible soft PO intake.  Spoke with ENT Physician Dr Constance Holster who indicated will plan on seeing tomorrow. - follow up ENT recs - follow up with Social work regarding SNF placement  Mild Thrombocytopenia Platelets- 143.  Appears to be patient baseline. - Repeat Platelet on 7/8   FEN/GI: NPO, tube feeds PPx: Lovenox Dispo:SNF today. Barriers include Difficult SNF placement.   Subjective:  Patient indicates no new issues.    Objective: Temp:  [97.7 F (36.5 C)-99.3 F (37.4 C)] 97.7 F (36.5 C) (07/05 0359) Pulse Rate:  [77-87] 78 (07/05 0359) Resp:  [16-20] 18 (07/05 0359) BP: (95-131)/(62-80) 95/65 (07/05 0359) SpO2:  [95 %-100 %] 100 % (07/05 0359) FiO2 (%):  [21 %-28 %] 21 % (07/05 0353) Physical Exam:  Physical Exam HENT:     Head: Normocephalic and atraumatic.     Mouth/Throat:     Mouth: Mucous membranes are moist.  Cardiovascular:     Rate and Rhythm: Normal rate and regular rhythm.  Pulmonary:     Effort: Pulmonary effort is normal. No respiratory distress.     Breath sounds: No wheezing or rales.     Comments: Upper airway sounds likely from Trach Musculoskeletal:     Right lower leg: No edema.     Left lower leg: No edema.  Skin:    General: Skin is warm.  Neurological:     Mental Status: She  is alert and oriented to person, place, and time.  Psychiatric:        Mood and Affect: Mood normal.        Behavior: Behavior normal.    Laboratory: Recent Labs  Lab 09/29/20 0157  WBC 4.6  HGB 11.9*  HCT 37.5  PLT 143*   Recent Labs  Lab 09/29/20 0157  NA 138  K 4.1  CL 101  CO2 30  BUN 21  CREATININE 0.54  CALCIUM 10.0  GLUCOSE 128*    Imaging/Diagnostic Tests: No new imaging  Delora Fuel, MD 09/30/2020, 6:56 AM PGY-1, [place] Intern pager: [phone], text pages welcome

## 2020-09-30 NOTE — Progress Notes (Signed)
SLP Cancellation Note  Patient Details Name: Stacy Moran MRN: [id number] DOB: July 07, 1955   Cancelled treatment:       Reason Eval/Treat Not Completed: Other (comment) SLP continues to follow - note that per MD note, ENT plans to see pt tomorrow. Will ideally try to coordinate with ENT for multidisciplinary evaluation, at least following up after their evaluation.  ENT, please consider reaching out to SLP when able to evaluate pt so that we can join (see pager number listed below).    Osie Bond., M.A. [facility] Pager [phone] Office [phone]  09/30/2020, 3:05 PM

## 2020-09-30 NOTE — TOC Progression Note (Signed)
Transition of Care [facility]) - Progression Note    Patient Details  Name: Stacy Moran MRN: [id number] Date of Birth: 24-Sep-1955  Transition of Care [facility]) CM/SW [place], RN Phone Number: 09/30/2020, 2:44 PM  Clinical Narrative:    Case management spoke with [facility] at [facility] and clinicals were resent to the facility for possible bed offer.  CM and MSW will continue to follow the patient for needed SNF placement.   Expected Discharge Plan: Skilled Nursing Facility Barriers to Discharge: SNF Pending bed offer  Expected Discharge Plan and Services Expected Discharge Plan: [place] In-house Referral: Clinical Social Work Discharge Planning Services: CM Consult Post Acute Care Choice: [place] arrangements for the past 2 months: Single Family Home                                       Social Determinants of Health (SDOH) Interventions    Readmission Risk Interventions No flowsheet data found.

## 2020-09-30 NOTE — Progress Notes (Signed)
Physical Therapy Treatment Patient Details Name: Stacy Moran MRN: [id number] DOB: 10-07-1955 Today's Date: 09/30/2020    History of Present Illness 65 y/o female presented to ED on 5/31 with generalized waekness and L arm pain/weakness s/p fall down the stairs. 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 Radiographs negative for acute fxs. PMH: SAH s/p PICA aneurysm clipping, bilateral SDH, seizures, chronic tracheostomy, PEG, seizures, HTN, GERD, anxiety    PT Comments    Pt making slow, steady progress. Continues to need encouragement to get OOB and ambulate but once up is happy to be out in hallway and seeing people. Ran into R and L walls numerous times, vc's to correct. Had difficulty pathfinding off floor. Ambulated 400' with RW and min A with periods of min-guard. PT will continue to follow.    Follow Up Recommendations  SNF     Equipment Recommendations  None recommended by PT    Recommendations for Other Services       Precautions / Restrictions Precautions Precautions: Fall Precaution Comments: trach, PEG Restrictions Weight Bearing Restrictions: No    Mobility  Bed Mobility Overal bed mobility: Needs Assistance Bed Mobility: Supine to Sit;Sit to Supine     Supine to sit: Supervision Sit to supine: Supervision   General bed mobility comments: pt able to come to long sitting without assist and turns to edge with cues    Transfers Overall transfer level: Needs assistance Equipment used: Rolling walker (2 wheeled) Transfers: Sit to/from Stand Sit to Stand: Min guard         General transfer comment: min-guard for safety due to pt's impulsivity  Ambulation/Gait Ambulation/Gait assistance: Min assist;Min guard Gait Distance (Feet): 400 Feet Assistive device: Rolling walker (2 wheeled) Gait Pattern/deviations: Step-through pattern;Drifts right/left;Decreased stride length Gait velocity:  decreased Gait velocity interpretation: <1.8 ft/sec, indicate of risk for recurrent falls General Gait Details: hit wall on R and L numerous times, needed vc's to redirect. Min A progressing to min-guard at times. Poor attention to obstacles in general but pt does recognize people in the hallway and waves   Stairs             Wheelchair Mobility    Modified Rankin (Stroke Patients Only)       Balance Overall balance assessment: Needs assistance Sitting-balance support: Feet supported;No upper extremity supported Sitting balance-Leahy Scale: Good     Standing balance support: During functional activity;Bilateral upper extremity supported;No upper extremity supported Standing balance-Leahy Scale: Fair Standing balance comment: static stand without UE support                            Cognition Arousal/Alertness: Awake/alert Behavior During Therapy: Impulsive;WFL for tasks assessed/performed Overall Cognitive Status: History of cognitive impairments - at baseline Area of Impairment: Safety/judgement;Problem solving;Following commands                       Following Commands: Follows one step commands with increased time;Follows one step commands consistently Safety/Judgement: Decreased awareness of deficits;Decreased awareness of safety Awareness: Intellectual Problem Solving: Difficulty sequencing;Requires verbal cues;Requires tactile cues General Comments: followed commands well today. Had some difficulty pathfinding coming on and off unit      Exercises      General Comments General comments (skin integrity, edema, etc.): HR 108 bpm after ambulation, SPO2 98% on RA      Pertinent Vitals/Pain Pain  Assessment: No/denies pain    Home Living                      Prior Function            PT Goals (current goals can now be found in the care plan section) Acute Rehab PT Goals Patient Stated Goal: patient would like to go home PT  Goal Formulation: With patient Time For Goal Achievement: 10/10/20 Potential to Achieve Goals: Good Progress towards PT goals: Progressing toward goals    Frequency    Min 2X/week      PT Plan Current plan remains appropriate;Frequency needs to be updated    Co-evaluation              AM-PAC PT "6 Clicks" Mobility   Outcome Measure  Help needed turning from your back to your side while in a flat bed without using bedrails?: None Help needed moving from lying on your back to sitting on the side of a flat bed without using bedrails?: A Little Help needed moving to and from a bed to a chair (including a wheelchair)?: A Little Help needed standing up from a chair using your arms (e.g., wheelchair or bedside chair)?: A Little Help needed to walk in hospital room?: A Little Help needed climbing 3-5 steps with a railing? : A Little 6 Click Score: 19    End of Session Equipment Utilized During Treatment: Gait belt Activity Tolerance: Patient tolerated treatment well Patient left: in bed;with bed alarm set;with call bell/phone within reach Nurse Communication: Mobility status PT Visit Diagnosis: Other abnormalities of gait and mobility (R26.89);Unsteadiness on feet (R26.81);Difficulty in walking, not elsewhere classified (R26.2);Other symptoms and signs involving the nervous system (R29.898)     Time: 7741-4239 PT Time Calculation (min) (ACUTE ONLY): 29 min  Charges:  $Gait Training: 23-37 mins                     Leighton Roach, [place]  Pager [phone] Office [place] 09/30/2020, 5:17 PM

## 2020-09-30 NOTE — Plan of Care (Signed)

## 2020-09-30 NOTE — Progress Notes (Addendum)
1:30pm: [facility] not have any available beds.  1pm: CSW resent clinicals to several local facilities requesting a review.  11:45am: CSW attempted to reach admissions at [facility] without success.  CSW spoke with [name] at [facility] who states the facility does not accept managed Medicaid.  CSW spoke with [name] at [facility] who is agreeable to review referral for possible placement at [facility].  CSW spoke with [name] at [facility] who states the facility cannot accept trach's at this time.  [name], MSW, LCSW Transitions of Care  Clinical Social Worker II [phone]

## 2020-10-01 LAB — RESP PANEL BY RT-PCR (FLU A&B, COVID) ARPGX2
Influenza A by PCR: NEGATIVE
Influenza B by PCR: NEGATIVE
SARS Coronavirus 2 by RT PCR: NEGATIVE

## 2020-10-01 NOTE — Progress Notes (Addendum)
ENT follow-up  Patient seen today in conjunction with speech pathology.  Since I saw her last she has made significant progress in her mental capacity and cognition.  She has been walking with the physical therapist.  She is still unable to swallow anything.  The most recent swallowing evaluation was reviewed and it did not demonstrate any complete swallow or passage of material down the esophagus.  I am not completely sure of the reason for this although this does warrant further investigation.  Her tracheostomy is stable.  She is using a Passy-Muir valve.  Her voice is a little bit weak but intelligible.  My plan is to bring her to the operating room as soon as possible and performed another laryngoscopy and this time also esophagoscopy and see if there is any anatomic reason for her to not be able to swallow.  I spoke with the patient's daughter saw her yesterday over the phone.  I explained the situation and the planned surgery for Friday.  All questions were answered and she agreed.

## 2020-10-01 NOTE — Evaluation (Signed)
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[place])    Status post insertion of percutaneous endoscopic gastrostomy (PEG) tube ([place])    Subdural hematoma ([place])    Tracheostomy dependent ([place])    Past Surgical History:  Past Surgical History:  Procedure Laterality Date   BURR HOLE N/A 03/18/2020   Procedure: Haskell Flirt;  Surgeon: [name], MD;  Location: [facility];  Service: Neurosurgery;  Laterality: N/A;   CRANIOTOMY Left 01/30/2020   Procedure: LEFT FAR LATERAL CRANIOTOMY FOR ANEURYSM CLIPPING;  Surgeon: [name], MD;  Location: [place] [facility] [place];  Service: Neurosurgery;  Laterality: Left;   DIRECT LARYNGOSCOPY N/A 02/29/2020   Procedure: DIRECT LARYNGOSCOPY;  Surgeon: [name], MD;  Location: [facility];  Service: ENT;  Laterality: N/A;   IR ANGIO INTRA EXTRACRAN SEL INTERNAL CAROTID BILAT MOD SED  01/30/2020   IR ANGIO VERTEBRAL SEL VERTEBRAL UNI L MOD SED  01/30/2020   IR CM INJ ANY COLONIC TUBE W/FLUORO  07/03/2020   IR GASTROSTOMY TUBE MOD SED  02/22/2020   IR REPLC GASTRO/COLONIC TUBE PERCUT W/FLUORO  07/03/2020   LAPAROSCOPIC REVISION VENTRICULAR-PERITONEAL (V-P) SHUNT N/A 03/18/2020   Procedure: LAPAROSCOPIC REVISION VENTRICULAR-PERITONEAL (V-P) SHUNT;  Surgeon: [name], MD;  Location: [facility];  Service: General;  Laterality: N/A;   RADIOLOGY WITH ANESTHESIA N/A 01/30/2020   Procedure: IR WITH ANESTHESIA;  Surgeon: [name], MD;  Location: [place];  Service: Radiology;  Laterality: N/A;   THYROIDECTOMY N/A 02/08/2020   Procedure: THYROIDECTOMY;  Surgeon: [name], MD;  Location: [place];  Service: ENT;  Laterality: N/A;   TRACHEOSTOMY  TUBE PLACEMENT N/A 02/08/2020   Procedure: TRACHEOSTOMY;  Surgeon: [name], MD;  Location: [facility] [place];  Service: ENT;  Laterality: N/A;   TRACHEOSTOMY TUBE PLACEMENT N/A 02/29/2020   Procedure: TRACHEOSTOMY EXCHANGE;  Surgeon: [name], MD;  Location: [place];  Service: ENT;  Laterality: N/A;   VENTRICULOPERITONEAL SHUNT N/A 03/18/2020   Procedure: RIGHT SHUNT INSERTION VENTRICULAR-PERITONEAL/ BURR HOLE Evacuation of Subdural Hematoma;  Surgeon: [name], MD;  Location: [place];  Service: Neurosurgery;  Laterality: N/A;   HPI:  Pt is a 65 year old woman with who presented with acute worsening of subdural hematomas after fall. MRI brain 5/31: "Unchanged bilateral subdural hematomas and trace rightward midline shift." Neurosurgery considering increasing the setting on her shunt as of 5/31. Pt d/c from CIR NPO with PEG has not comlpeted any SLP therapy or swallow evaluations since 05/20/20. At that time pt still not tolearting PMSV well. 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  MBS 02/19/20 profound dysphagia with abnormal anatomy. Repeat MBS 02/26/21 minimal improvement- decr edema however significant subglottic edema/stenosis narrowing subglottic space. Bolus stops at UES with minimal opening with aspiration during/post swalow- continue NPO. MBS 04/09/20: severe pharyngoesophageal dysphagia with minimal improvements. subglottic edema was decreased but UES function continued to be significantly impaired in addition to tongue base retraction and pharyngeal contraction all contributing to pharyngeal stasis. Pt was also seen ENT. On 02/29/20 Dr Constance Holster performed laryngoscopy reporting "The cords were thickened  and swollen.  There is significant subglottic swelling especially on the left side and posteriorly.  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  As the  anesthesia was wearing off the cords were inspected.  Deep suctioning through the tracheostomy was used to elicit a cough.  There definitely seem to be movement of the right cord.  The left cord may be paretic but it is a little hard to tell... Over the next several months we can continue evaluation to see how the subglottic airway grasses.  She may need future procedures to open the subglottis if necessary"   Assessment / Plan / Recommendation Clinical Impression   Pt was seen in conjunction with ENT today. Pt had PMV in place and was vocalizing in response to questions, although with her vocal quality still a little soft and dysphonic. Imaging from most recent MBS was reviewed directly; results of FEES and comparison to most recent laryngoscopy also discussed, as was course of tx thus far. Given severity of findings on repeated testing additional PO trials were not offered at bedside. Although mentation can certainly impact aspects of dysphagia therapy, her mentation has also improved since initial swallow testing and yet oropharyngeal function remains significantly impaired with minimal passage of the bolus despite efforts in swallowing. Per ENT, plan is to perform a repeat laryngoscopy with esophagoscopy while inpatient in order to more closely evaluate anatomy given the concern for its impact on function. Will hold additional therapies pending completion of this so that findings can be reviewed and utilized. In the mean time, would continue to use alternative means of nutrition as aspiration risk remains high.   SLP Visit Diagnosis: Dysphagia, unspecified (R13.10)    Aspiration Risk  Severe aspiration risk    Diet Recommendation NPO;Alternative means - long-term   Medication Administration: Via alternative means    Other  Recommendations Recommended Consults: Consider ENT evaluation Oral Care Recommendations: Oral care QID   Follow up Recommendations Skilled Nursing facility      Frequency  and Duration            Prognosis Prognosis for Safe Diet Advancement: Guarded Barriers to Reach Goals: Time post onset;Severity of deficits;Cognitive deficits      Swallow Study   General Date of Onset: 02/19/20 HPI: Pt is a 65 year old woman with who presented with acute worsening of subdural hematomas after fall. MRI brain 5/31: "Unchanged bilateral subdural hematomas and trace rightward midline shift." Neurosurgery considering increasing the setting on her shunt as of 5/31. Pt d/c from CIR NPO with PEG has not comlpeted any SLP therapy or swallow evaluations since 05/20/20. At that time pt still not tolearting PMSV well. 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  MBS 02/19/20 profound dysphagia with abnormal anatomy. Repeat MBS 02/26/21 minimal improvement- decr edema however significant subglottic edema/stenosis narrowing subglottic space. Bolus stops at UES with minimal opening with aspiration during/post swalow- continue NPO. MBS 04/09/20: severe pharyngoesophageal dysphagia with minimal improvements. subglottic edema was decreased but UES function continued to be significantly impaired in addition to tongue base retraction and pharyngeal contraction all contributing to pharyngeal stasis. Pt was also seen ENT. On 02/29/20 Dr Constance Holster performed laryngoscopy reporting "The cords were thickened and swollen.  There is significant subglottic swelling especially on the left side and posteriorly.  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  As the anesthesia was wearing  off the cords were inspected.  Deep suctioning through the tracheostomy was used to elicit a cough.  There definitely seem to be movement of the right cord.  The left cord may be paretic but it is a little hard to tell... Over the next several months we can continue evaluation to see how the  subglottic airway grasses.  She may need future procedures to open the subglottis if necessary" Type of Study: Bedside Swallow Evaluation Previous Swallow Assessment: See HPI Diet Prior to this Study: NPO;PEG tube Temperature Spikes Noted: No Respiratory Status: Trach Collar;Trach Trach Size and Type: Uncuffed;#6;With PMSV in place History of Recent Intubation: No Behavior/Cognition: Alert;Cooperative;Pleasant mood Oral Care Completed by SLP: No Oral Cavity - Dentition: Adequate natural dentition Patient Positioning: Partially reclined Baseline Vocal Quality: Low vocal intensity;Hoarse    Oral/Motor/Sensory Function     Ice Chips Ice chips: Not tested   Thin Liquid Thin Liquid: Not tested    Nectar Thick Nectar Thick Liquid: Not tested   Honey Thick Honey Thick Liquid: Not tested   Puree Puree: Not tested   Solid     Solid: Not tested      Osie Bond., M.A. [place] Pager [phone] Office [phone]  10/01/2020,11:25 AM

## 2020-10-01 NOTE — Progress Notes (Signed)
Nutrition Follow-up  DOCUMENTATION CODES:   Not applicable  INTERVENTION:  -Continue 355 ml Osmolite 1.2 QID via PEG -Continue 45 ml Prosource TF BID via PEG -Continue 200 ml free water every 6 hours via PEG   Complete regimen provides 1784 kcals, 100 grams protein, and 1970 ml free water daily, meeting 100% of estimated kcal and protein needs  NUTRITION DIAGNOSIS:   Inadequate oral intake related to inability to eat as evidenced by NPO status.  ongoing  GOAL:   Patient will meet greater than or equal to 90% of their needs  Met with TF  MONITOR:   Labs, Weight trends, TF tolerance, Skin, I & O's  REASON FOR ASSESSMENT:   Other (Comment)    ASSESSMENT:   65 year old woman with recent (winter 2021) ruptured PICA anuerysm resulting in [facility] with prolonged hospital stay requiring tracheostomy placement, thyroidectomy (for tracheostomy), and PEG tube placement presenting with acute worsening of her subdural hematomas after a fall today. She complains of residual reduction in bilateral grip strength. Daughter [name], works at [employer]) provides additional history. The patient has poor sleep and becomes agitated and confused at night at times. This morning, despite multiple home measures include alarms and lights, the patient fell down the stairs. Denies headaches, chest pain, abdominal pain, endorses only hand pain.  ENT and SLP followed up with pt today and noted pt has had significant progress in mental capacity and cognition, but has still been unable to pass swallow evaluation. Pt's trach is stable and pt is using PMV. ENT planning to perform another laryngoscopy and at this time also esophagoscopy to see if there is any anatomic cause for pt's swallowing limitations. At this time, pt remains on TF via PEG and has had no issues with toleration per RN. Current TF regimen: 355 ml Osmolite 1.2 QID, 45 ml Prosource Plus BID, and 200 ml free water flush 4 times daily. Continue  current nutrition plan of care at this time. Per MD, pt is stable for discharge and is pending d/c to SNF.   UOP: 2x unmeasured occurrences x24 hours  Medications: miralax, senokot Labs reviewed.   Diet Order:   Diet Order             Diet NPO time specified  Diet effective midnight                   EDUCATION NEEDS:   Not appropriate for education at this time  Skin:  Skin Assessment: Reviewed RN Assessment  Last BM:  09/30/20  Height:   Ht Readings from Last 1 Encounters:  07/29/20 5' 6"  (1.676 m)    Weight:   Wt Readings from Last 1 Encounters:  09/16/20 59.7 kg    Ideal Body Weight:  59.1 kg  BMI:  Body mass index is 21.24 kg/m.  Estimated Nutritional Needs:   Kcal:  1947-1252  Protein:  85-100 grams  Fluid:  > 1.7 L    Larkin Ina, MS, RD, LDN (she/her/hers) RD pager number and weekend/on-call pager number located in [employer].

## 2020-10-01 NOTE — H&P (View-Only) (Signed)
ENT follow-up  Patient seen today in conjunction with speech pathology.  Since I saw her last she has made significant progress in her mental capacity and cognition.  She has been walking with the physical therapist.  She is still unable to swallow anything.  The most recent swallowing evaluation was reviewed and it did not demonstrate any complete swallow or passage of material down the esophagus.  I am not completely sure of the reason for this although this does warrant further investigation.  Her tracheostomy is stable.  She is using a Passy-Muir valve.  Her voice is a little bit weak but intelligible.  My plan is to bring her to the operating room as soon as possible and performed another laryngoscopy and this time also esophagoscopy and see if there is any anatomic reason for her to not be able to swallow.  I spoke with the patient's daughter saw her yesterday over the phone.  I explained the situation and the planned surgery for Friday.  All questions were answered and she agreed.

## 2020-10-01 NOTE — Progress Notes (Signed)
Family Medicine Teaching Service Daily Progress Note Intern Pager: [phone]  Patient name: Stacy Moran Medical record number: [id number] Date of birth: Aug 02, 1955 Age: 65 y.o. Gender: female  Primary Care Provider: [name], MD Consultants: ENT Code Status: DNR  Pt Overview and Major Events to Date:  08/26/20- Admitted  Assessment and Plan: Patient complex medical care patient with tracheostomy and PEG tube.  Currently awaiting SNF placement.  *Patient medically stable to discharge*  Dysphagia  Ostomy  PEG tube Dr Constance Holster will see today.   - follow up ENT recs - reach out to daughter today  FEN/GI: NPO, tube feeds PPx: Lovenox Dispo:SNF today. Barriers include difficult SNF placement due to Ostomy.   Subjective:  Patient indicates feeling well and wanting to eat.  Objective: Temp:  [98.1 F (36.7 C)-98.8 F (37.1 C)] 98.1 F (36.7 C) (07/06 0402) Pulse Rate:  [72-89] 80 (07/06 0402) Resp:  [15-18] 15 (07/06 0402) BP: (123-135)/(75-81) 135/81 (07/06 0402) SpO2:  [97 %-100 %] 98 % (07/06 0402) FiO2 (%):  [21 %-98 %] 98 % (07/05 1013) Physical Exam:  Physical Exam HENT:     Head: Normocephalic and atraumatic.     Mouth/Throat:     Mouth: Mucous membranes are moist.  Cardiovascular:     Pulses: Normal pulses.  Pulmonary:     Effort: Pulmonary effort is normal.     Comments: Upper airway sounds, congestion Musculoskeletal:     Right lower leg: No edema.     Left lower leg: No edema.  Skin:    General: Skin is warm.  Neurological:     Mental Status: She is alert.     Laboratory: Recent Labs  Lab 09/29/20 0157  WBC 4.6  HGB 11.9*  HCT 37.5  PLT 143*   Recent Labs  Lab 09/29/20 0157  NA 138  K 4.1  CL 101  CO2 30  BUN 21  CREATININE 0.54  CALCIUM 10.0  GLUCOSE 128*     Imaging/Diagnostic Tests: No new imaging  Delora Fuel, MD 10/01/2020, 7:23 AM PGY-1, [place] Intern pager: [phone], text pages  welcome

## 2020-10-01 NOTE — TOC Progression Note (Addendum)
Transition of Care [facility]) - Progression Note    Patient Details  Name: Stacy Moran MRN: [id number] Date of Birth: Jan 13, 1956  Transition of [facility]) CM/SW Contact  Curlene Labrum, RN Phone Number: 10/01/2020, 12:45 PM  Clinical Narrative:    Case management spoke with Stacy Moran, MSW and the patient will need tube feeds sent with her to [facility] when she transfers by ambulance.  I sent a secure message to Dr. Manus Rudd to have the tube feed prescription sent through [facility].  MSW and CM will continue to follow the patient for SNF placement - pending for [facility].  I called and spoke with the patient's daughter, Stacy Moran, on the phone and she has multiple cases for nutritional supplements at home and is planning to deliver this to [facility] by tomorrow.  The patient's daughter is aware that the patient will be transferring to [facility] once she has completed her procedure with Dr. Constance Holster and she is medically stable to discharge to the facility.  Rapid COVID was ordered so that her screen is in date when she is able to transfer to the facility.  CM and MSW will continue to follow the patient for discharge to [facility] once she is medically stable to discharge to the facility.  The patient's family is aware.   Expected Discharge Plan: Skilled Nursing Facility Barriers to Discharge: SNF Pending bed offer  Expected Discharge Plan and Services Expected Discharge Plan: [place] In-house Referral: Clinical Social Work Discharge Planning Services: CM Consult Post Acute Care Choice: [place] arrangements for the past 2 months: Single Family Home                                       Social Determinants of Health (SDOH) Interventions    Readmission Risk Interventions No flowsheet data found.

## 2020-10-01 NOTE — Progress Notes (Addendum)
12:30pm: RN CM to request formula from [facility] so that it can be sent with patient.  Patient will require a rapid COVID test prior to discharge.   11:30am: CSW was notified patient was offered a bed at [facility]. CSW spoke with [name] at [facility] who states she has initiated Ship [facility]. [name] states the patient's PEG feedings will not be available at the facility until Friday.  CSW updated MD of information.  7:50am: CSW spoke with [name] at [facility] who states the facilities she manages cannot offer a bed at this time.  CSW spoke with Helene Kelp at [facility] who will determine bed availability and return call to [facility].  [name], MSW, LCSW Transitions of Care  Clinical Social Worker II [phone]

## 2020-10-02 ENCOUNTER — Encounter (HOSPITAL_COMMUNITY): Payer: Self-pay | Admitting: Family Medicine

## 2020-10-02 NOTE — Progress Notes (Signed)
Family Medicine Teaching Service Daily Progress Note Intern Pager: [phone]  Patient name: Stacy Moran Medical record number: [id number] Date of birth: 1955-06-28 Age: 65 y.o. Gender: female  Primary Care Provider: [name], MD Consultants: ENT Code Status: Full  Pt Overview and Major Events to Date:  08/26/20- Admitted  Assessment and Plan: Patient complex patient with trach collar and dysphagia.  ENT to see tomorrow for esophageal procedure.  Plan for SNF placement following procedure.  Dysphagia Plan by Dr Constance Holster to perform procedure tomorrow 7/8. - Discharge to SNF following procedure if no complications - Stop Peg tube feeds at midnight  Code Status Patient expresses desire to be made Full code.  Discussed this fully with her and is coherent and expresses full understanding of this decision.    FEN/GI: NPO, Peg Tube  PPx: Lovenox Dispo:SNF tomorrow. Barriers include ENT Procedure.   Subjective:  Patient denies any issues this AM.    Objective: Temp:  [98.5 F (36.9 C)-98.9 F (37.2 C)] 98.8 F (37.1 C) (07/07 0722) Pulse Rate:  [67-100] 77 (07/07 1012) Resp:  [16-20] 20 (07/07 1012) BP: (106-131)/(62-112) 128/63 (07/07 0722) SpO2:  [96 %-100 %] 99 % (07/07 0722) FiO2 (%):  [21 %-28 %] 21 % (07/07 1012) Physical Exam:  Physical Exam HENT:     Head: Normocephalic and atraumatic.     Mouth/Throat:     Mouth: Mucous membranes are moist.  Cardiovascular:     Rate and Rhythm: Normal rate and regular rhythm.     Pulses: Normal pulses.  Pulmonary:     Effort: Pulmonary effort is normal. No respiratory distress.     Breath sounds: No wheezing or rales.     Comments: Upper airway sounds Abdominal:     General: Abdomen is flat. Bowel sounds are normal.     Palpations: Abdomen is soft.  Skin:    General: Skin is warm.  Neurological:     Mental Status: She is alert.     Laboratory: Recent Labs  Lab 09/29/20 0157  WBC 4.6  HGB 11.9*  HCT 37.5   PLT 143*   Recent Labs  Lab 09/29/20 0157  NA 138  K 4.1  CL 101  CO2 30  BUN 21  CREATININE 0.54  CALCIUM 10.0  GLUCOSE 128*    Imaging/Diagnostic Tests: No new imaging  Delora Fuel, MD 10/02/2020, 11:59 AM PGY-1, [place] Intern pager: [phone], text pages welcome

## 2020-10-02 NOTE — Progress Notes (Signed)
FPTS Brief Progress Note  S:Patient watching television with no complaints at time of visit. Patient inquired about when she would be able to transition to SNF. Provided updates on ENT evaluation. Patient voiced understanding.    O: BP 131/85 (BP Location: Left Arm)   Pulse 100   Temp 98.9 F (37.2 C) (Oral)   Resp 18   Wt 59.7 kg   SpO2 100%   BMI 21.24 kg/m   General: frail, chronically ill appearing female resting in bed in NAD Resp: trach in place, no signs of respiratory distress   A/P: Pt stable, without new concerns.  Awaiting ENT evalution and transfer to SNF on 7/8 - Orders reviewed. Labs for AM ordered for Mondays in event pt does not d/c to SNF, which was adjusted as needed.   Eulis Foster, MD 10/02/2020, 1:07 AM PGY-3, [facility] Family Medicine Night Resident  Please page [phone] with questions.

## 2020-10-02 NOTE — Progress Notes (Signed)
CSW sent patient's H&P to [name] at [facility] for further review.  [name], MSW, LCSW Transitions of Care  Clinical Social Worker II [phone]

## 2020-10-02 NOTE — Progress Notes (Signed)
FPTS Brief Progress Note  S:Pt sleeping soundly in bed.  O: BP (!) 139/96 (BP Location: Left Arm)   Pulse 92   Temp 98.1 F (36.7 C) (Oral)   Resp 17   Wt 59.7 kg   SpO2 97%   BMI 21.24 kg/m   General: pt sleeping in bed Resp: normal work of breathing on trach.  A/P: 65 year old female here for dysphagia and awaiting ENT management.  Pt stable, no new concerns. -ENT procedure on 7/8, unable to view cord mobility. Will be reevaluated by ENT tomorrow. -Plan to DC to SNF after procedure  - Orders reviewed. Labs for AM ordered, which was adjusted as needed.    Precious Gilding, DO 10/02/2020, 10:15 PM PGY-1, [place] Family Medicine Night Resident  Please page [phone] with questions.

## 2020-10-02 NOTE — Progress Notes (Signed)
Physical Therapy Treatment Patient Details Name: Stacy Moran MRN: [id number] DOB: 1955-05-21 Today's Date: 10/02/2020    History of Present Illness 65 y/o female presented to ED on 5/31 with generalized waekness and L arm pain/weakness s/p fall down the stairs. 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 Radiographs negative for acute fxs. PMH: SAH s/p PICA aneurysm clipping, bilateral SDH, seizures, chronic tracheostomy, PEG, seizures, HTN, GERD, anxiety    PT Comments    Patient continues to progress towards physical therapy goals. Patient requires encouragement for OOB mobility this date. Patient continues to demo impulsivity and decreased safety awareness throughout mobility. Patient required up to minA for ambulation with RW due to obstacle negotiation and RW management as patient tends to leave RW behind at times. Continue to recommend SNF for ongoing Physical Therapy.       Follow Up Recommendations  SNF     Equipment Recommendations  None recommended by PT    Recommendations for Other Services       Precautions / Restrictions Precautions Precautions: Fall Precaution Comments: trach, PEG Restrictions Weight Bearing Restrictions: No    Mobility  Bed Mobility Overal bed mobility: Needs Assistance Bed Mobility: Supine to Sit;Sit to Supine     Supine to sit: Supervision Sit to supine: Supervision   General bed mobility comments: supervision for safety due to impulsivity    Transfers Overall transfer level: Needs assistance Equipment used: Rolling [name] Marcelli (2 wheeled) Transfers: Sit to/from Stand Sit to Stand: Min guard         General transfer comment: min guard for safety due to impulsivity  Ambulation/Gait Ambulation/Gait assistance: Min guard;Min assist Gait Distance (Feet): 200 Feet Assistive device: Rolling Johonna Binette (2 wheeled) Gait Pattern/deviations: Step-through pattern;Drifts  right/left;Decreased stride length Gait velocity: decreased   General Gait Details: hitting objects on R side due to inattention to environment. Required multimodal cues and assist for RW management due to patient being easily distracted and leaving/letting go of RW. MinA for balance and RW management at times when distracted   Stairs             Wheelchair Mobility    Modified Rankin (Stroke Patients Only)       Balance Overall balance assessment: Needs assistance Sitting-balance support: Feet supported;No upper extremity supported Sitting balance-Leahy Scale: Good     Standing balance support: During functional activity;Bilateral upper extremity supported;No upper extremity supported Standing balance-Leahy Scale: Fair Standing balance comment: static stand without UE support                            Cognition Arousal/Alertness: Awake/alert Behavior During Therapy: Impulsive;WFL for tasks assessed/performed Overall Cognitive Status: History of cognitive impairments - at baseline Area of Impairment: Safety/judgement;Problem solving;Following commands                       Following Commands: Follows one step commands with increased time;Follows one step commands consistently Safety/Judgement: Decreased awareness of deficits;Decreased awareness of safety   Problem Solving: Difficulty sequencing;Requires verbal cues;Requires tactile cues General Comments: follows commands at times if she agrees with them. Impulsive this session, leaving RW behind numerous times despite cueing      Exercises      General Comments        Pertinent Vitals/Pain Pain Assessment: No/denies pain    Home Living  Prior Function            PT Goals (current goals can now be found in the care plan section) Acute Rehab PT Goals Patient Stated Goal: patient would like to go home PT Goal Formulation: With patient Time For Goal  Achievement: 10/10/20 Potential to Achieve Goals: Good Progress towards PT goals: Progressing toward goals    Frequency    Min 2X/week      PT Plan Current plan remains appropriate    Co-evaluation              AM-PAC PT "6 Clicks" Mobility   Outcome Measure  Help needed turning from your back to your side while in a flat bed without using bedrails?: None Help needed moving from lying on your back to sitting on the side of a flat bed without using bedrails?: A Little Help needed moving to and from a bed to a chair (including a wheelchair)?: A Little Help needed standing up from a chair using your arms (e.g., wheelchair or bedside chair)?: A Little Help needed to walk in hospital room?: A Little Help needed climbing 3-5 steps with a railing? : A Little 6 Click Score: 19    End of Session Equipment Utilized During Treatment: Gait belt Activity Tolerance: Patient tolerated treatment well Patient left: in bed;with bed alarm set;with call bell/phone within reach (RT present) Nurse Communication: Mobility status PT Visit Diagnosis: Other abnormalities of gait and mobility (R26.89);Unsteadiness on feet (R26.81);Difficulty in walking, not elsewhere classified (R26.2);Other symptoms and signs involving the nervous system (R29.898)     Time: 6144-3154 PT Time Calculation (min) (ACUTE ONLY): 26 min  Charges:  $Gait Training: 23-37 mins                     Lona Six A. Gilford Rile PT, DPT Acute Rehabilitation Services Pager [phone] Office [phone]    Linna Hoff 10/02/2020, 5:05 PM

## 2020-10-02 NOTE — Anesthesia Preprocedure Evaluation (Addendum)
Anesthesia Evaluation  Patient identified by MRN, date of birth, ID band Patient awake    Reviewed: Allergy & Precautions, NPO status , Patient's Chart, lab work & pertinent test results, reviewed documented beta blocker date and time   Airway Mallampati: [place]  (+) Dental Advisory Given   Pulmonary former smoker,  Respiratory failure- Trach dependent   Pulmonary exam normal breath sounds clear to auscultation       Cardiovascular Exercise Tolerance: Poor hypertension, Pt. on medications and Pt. on home beta blockers Normal cardiovascular exam Rhythm:Regular Rate:Normal     Neuro/Psych Seizures -, Well Controlled,  Hx/o ruptured cerebral aneurysm with intracranial and [facility] 11/21 Obstructive hydrocephalus S/P V-P shunt Hx/o subdural hematoma S/P Burr holes CVA, Residual Symptoms negative psych ROS   GI/Hepatic Neg liver ROS, Dysphagia S/P PEG tube placement   Endo/Other  Hypothyroidism S/P Thyroidectomy  Renal/GU negative Renal ROS   Functional urinary incontinence    Musculoskeletal   Abdominal   Peds  Hematology  (+) anemia , Thrombocytopenia   Anesthesia Other Findings   Reproductive/Obstetrics                           Anesthesia Physical Anesthesia Plan  ASA: 4  Anesthesia Plan: General   Post-op Pain Management:    Induction: Inhalational and Intravenous  PONV Risk Score and Plan: 3 and Treatment may vary due to age or medical condition and Ondansetron  Airway Management Planned: Tracheostomy  Additional Equipment:   Intra-op Plan:   Post-operative Plan:   Informed Consent: I have reviewed the patients History and Physical, chart, labs and discussed the procedure including the risks, benefits and alternatives for the proposed anesthesia with the patient or authorized representative who has indicated his/her understanding and acceptance.     Dental  advisory given  Plan Discussed with: CRNA and Anesthesiologist  Anesthesia Plan Comments:        Anesthesia Quick Evaluation

## 2020-10-03 ENCOUNTER — Inpatient Hospital Stay (HOSPITAL_COMMUNITY): Payer: Medicaid Other | Admitting: Certified Registered"

## 2020-10-03 ENCOUNTER — Encounter (HOSPITAL_COMMUNITY)
Admission: EM | Disposition: A | Discharge status: Skilled Nursing Facility | Payer: Self-pay | Source: Home / Self Care | Attending: Family Medicine

## 2020-10-03 HISTORY — PX: DIRECT LARYNGOSCOPY: SHX5326

## 2020-10-03 LAB — PLATELET COUNT: Platelets: 146 10*3/uL — ABNORMAL LOW (ref 150–400)

## 2020-10-03 SURGERY — LARYNGOSCOPY, DIRECT
Anesthesia: General

## 2020-10-03 MED ORDER — FENTANYL CITRATE (PF) 250 MCG/5ML IJ SOLN
INTRAMUSCULAR | Status: DC | PRN
  Administered 2020-10-03: 50 ug via INTRAVENOUS
  Administered 2020-10-03: 25 ug via INTRAVENOUS

## 2020-10-03 MED ORDER — ONDANSETRON HCL 4 MG/2ML IJ SOLN
INTRAMUSCULAR | Status: DC | PRN
  Administered 2020-10-03: 4 mg via INTRAVENOUS

## 2020-10-03 MED ORDER — NALOXONE HCL 0.4 MG/ML IJ SOLN
INTRAMUSCULAR | Status: AC
  Filled 2020-10-03: qty 1

## 2020-10-03 MED ORDER — EPINEPHRINE HCL (NASAL) 0.1 % NA SOLN
NASAL | Status: AC
  Filled 2020-10-03: qty 30

## 2020-10-03 MED ORDER — FENTANYL CITRATE (PF) 250 MCG/5ML IJ SOLN
INTRAMUSCULAR | Status: AC
  Filled 2020-10-03: qty 5

## 2020-10-03 MED ORDER — NALOXONE HCL 0.4 MG/ML IJ SOLN
INTRAMUSCULAR | Status: DC | PRN
  Administered 2020-10-03: 80 ug via INTRAVENOUS
  Administered 2020-10-03: 40 ug via INTRAVENOUS
  Administered 2020-10-03 (×2): 80 ug via INTRAVENOUS
  Administered 2020-10-03: 40 ug via INTRAVENOUS

## 2020-10-03 MED ORDER — PROPOFOL 10 MG/ML IV BOLUS
INTRAVENOUS | Status: DC | PRN
  Administered 2020-10-03: 20 mg via INTRAVENOUS
  Administered 2020-10-03: 60 mg via INTRAVENOUS

## 2020-10-03 MED ORDER — PROPOFOL 10 MG/ML IV BOLUS
INTRAVENOUS | Status: AC
  Filled 2020-10-03: qty 20

## 2020-10-03 MED ORDER — ONDANSETRON HCL 4 MG/2ML IJ SOLN: 4.0000 mg | Freq: Once | INTRAMUSCULAR | Status: DC | PRN

## 2020-10-03 MED ORDER — FENTANYL CITRATE (PF) 100 MCG/2ML IJ SOLN: 25.0000 ug | INTRAMUSCULAR | Status: DC | PRN

## 2020-10-03 MED ORDER — 0.9 % SODIUM CHLORIDE (POUR BTL) OPTIME
TOPICAL | Status: DC | PRN
  Administered 2020-10-03: 1000 mL

## 2020-10-03 MED ORDER — PROPOFOL 500 MG/50ML IV EMUL
INTRAVENOUS | Status: DC | PRN
  Administered 2020-10-03: 100 ug/kg/min via INTRAVENOUS

## 2020-10-03 MED ORDER — LACTATED RINGERS IV SOLN: INTRAVENOUS | Status: DC | PRN

## 2020-10-03 MED ORDER — MIDAZOLAM HCL 2 MG/2ML IJ SOLN
INTRAMUSCULAR | Status: AC
  Filled 2020-10-03: qty 2

## 2020-10-03 SURGICAL SUPPLY — 18 items
BAG COUNTER SPONGE SURGICOUNT (BAG) ×2 IMPLANT
CANISTER SUCT 3000ML PPV (MISCELLANEOUS) ×2 IMPLANT
COVER BACK TABLE 60X90IN (DRAPES) ×2 IMPLANT
COVER MAYO STAND STRL (DRAPES) ×2 IMPLANT
DRAPE HALF SHEET 40X57 (DRAPES) ×2 IMPLANT
GAUZE 4X4 16PLY ~~LOC~~+RFID DBL (SPONGE) ×1 IMPLANT
GLOVE SURG LTX SZ7.5 (GLOVE) ×2 IMPLANT
GUARD TEETH (MISCELLANEOUS) IMPLANT
KIT BASIN OR (CUSTOM PROCEDURE TRAY) ×2 IMPLANT
KIT TURNOVER KIT B (KITS) ×2 IMPLANT
NS IRRIG 1000ML POUR BTL (IV SOLUTION) ×2 IMPLANT
PAD ARMBOARD 7.5X6 YLW CONV (MISCELLANEOUS) ×4 IMPLANT
PATTIES SURGICAL .5 X3 (DISPOSABLE) IMPLANT
SOL ANTI FOG 6CC (MISCELLANEOUS) IMPLANT
SOLUTION ANTI FOG 6CC (MISCELLANEOUS) ×1
TOWEL GREEN STERILE (TOWEL DISPOSABLE) ×2 IMPLANT
TUBE CONNECTING 12X1/4 (SUCTIONS) ×2 IMPLANT
TUBE TRACH FLEX 6.0 UNCF (MISCELLANEOUS) ×1 IMPLANT

## 2020-10-03 NOTE — Progress Notes (Signed)
Family Medicine Teaching Service Daily Progress Note Intern Pager: [phone]  Patient name: Stacy Moran Medical record number: [id number] Date of birth: 22-Oct-1955 Age: 65 y.o. Gender: female  Primary Care Provider: [name], MD Consultants: ENT Code Status: Full  Pt Overview and Major Events to Date:  08/26/20- Admitted  Assessment and Plan: Patient here for dysphagia awaiting ENT management and d/c to SNF once cleared.  Dysphasia w/ Trach Collar Endoscopic procedure today with ENT Dr Constance Holster. - follow up ENT recs  FEN/GI: NPO, Hold tube feeds PPx: Lovenox Dispo:SNF tomorrow. Barriers include Medical Management.   Subjective:  Patient in OR for procedure during rounds.  Follow up Attending note.  Objective: Temp:  [97.6 F (36.4 C)-98.9 F (37.2 C)] 98 F (36.7 C) (07/08 0855) Pulse Rate:  [72-92] 72 (07/08 0900) Resp:  [12-20] 12 (07/08 0900) BP: (103-153)/(65-96) 134/75 (07/08 0900) SpO2:  [97 %-100 %] 100 % (07/08 0900) FiO2 (%):  [21 %] 21 % (07/07 2026) Physical Exam:  Patient in OR for  procedure during rounds.  Follow up Attending note.  Laboratory: Recent Labs  Lab 09/29/20 0157 10/03/20 0321  WBC 4.6  --   HGB 11.9*  --   HCT 37.5  --   PLT 143* 146*   Recent Labs  Lab 09/29/20 0157  NA 138  K 4.1  CL 101  CO2 30  BUN 21  CREATININE 0.54  CALCIUM 10.0  GLUCOSE 128*     Imaging/Diagnostic Tests: No new imaging  Delora Fuel, MD 10/03/2020, 11:22 AM PGY-1, [place] Intern pager: [phone], text pages welcome

## 2020-10-03 NOTE — Interval H&P Note (Signed)
History and Physical Interval Note:  10/03/2020 7:07 AM  Stacy Moran  has presented today for surgery, with the diagnosis of trach dependant, dysphagia.  The various methods of treatment have been discussed with the patient and family. After consideration of risks, benefits and other options for treatment, the patient has consented to  Procedure(s): DIRECT LARYNGOSCOPY w/esophagascopy (N/A) as a surgical intervention.  The patient's history has been reviewed, patient examined, no change in status, stable for surgery.  I have reviewed the patient's chart and labs.  Questions were answered to the patient's satisfaction.     [name]

## 2020-10-03 NOTE — Anesthesia Postprocedure Evaluation (Signed)
Anesthesia Post Note  Patient: Stacy Moran  Procedure(s) Performed: DIRECT LARYNGOSCOPY w/esophagascopy     Patient location during evaluation: PACU Anesthesia Type: General Level of consciousness: awake and alert and oriented Pain management: pain level controlled Vital Signs Assessment: post-procedure vital signs reviewed and stable Respiratory status: spontaneous breathing, nonlabored ventilation and respiratory function stable (fenestrated trach in place) Cardiovascular status: blood pressure returned to baseline and stable Postop Assessment: no apparent nausea or vomiting Anesthetic complications: no   No notable events documented.  Last Vitals:  Vitals:   10/03/20 0855 10/03/20 0900  BP: 124/79 134/75  Pulse: 74 72  Resp: 16 12  Temp: 36.7 C   SpO2: 98% 100%    Last Pain:  Vitals:   10/03/20 0845  TempSrc:   PainSc: 0-No pain                 Adonna Horsley A.

## 2020-10-03 NOTE — Op Note (Signed)
OPERATIVE REPORT  DATE OF SURGERY: 10/03/2020  PATIENT:  Stacy Moran,  65 y.o. female  PRE-OPERATIVE DIAGNOSIS:  trach dependant, dysphagia  POST-OPERATIVE DIAGNOSIS:  trach dependant, dysphagia  PROCEDURE:  Procedure(s): DIRECT LARYNGOSCOPY w/esophagascopy  SURGEON:  Beckie Salts, MD  ASSISTANTS: none  ANESTHESIA:   General   EBL: Less than 10 ml  DRAINS: none  LOCAL MEDICATIONS USED:  None  SPECIMEN: None  COUNTS:  Correct  PROCEDURE DETAILS: The patient was taken to the operating room and placed on the operating table in the supine position. Following induction of general endotracheal anesthesia, through the tracheostomy site with an endotracheal tube, the table was turned 90 and the patient was draped in a standard fashion.  A maxillary tooth protector was used throughout the case.  1.  Esophagoscopy.  The cervical rigid esophagoscope was used.  It was entered into the oral cavity through the esophageal introitus and advanced down the cervical esophagus as far as it could pass.  There is no obstruction.  Mucosa all looked healthy.  The scope was slowly withdrawn while inspecting the esophageal wall in all directions and suctioning secretions.  There were no lesions or obstructions noted.  The scope was then removed.  2.  Direct laryngoscopy.  A Jako laryngoscope was used to visualize the larynx.  The cords were identified and were significantly thickened on both sides.  The thickening extended into the subglottis.  There is no fixed obstruction of the subglottic larynx.  I was able to visualize the endotracheal tube through the cords using a rod telescope.  Attempts were made to visualize cord mobility after lightening the anesthetic but with this was unsuccessful.  I was unable to see any cord mobility but I am not sure if it was functional due to the patient's movements.  The scope was removed and the patient was allowed to awaken and was then transferred to PACU with a  noncuffed tracheostomy tube in place again.  I am going to plan to reevaluate tomorrow with an awake fiberoptic bedside laryngoscopy to see if I can ascertain any additional information about cord mobility.    PATIENT DISPOSITION:  To PACU, stable

## 2020-10-03 NOTE — Transfer of Care (Signed)
Immediate Anesthesia Transfer of Care Note  Patient: Stacy Moran  Procedure(s) Performed: DIRECT LARYNGOSCOPY w/esophagascopy  Patient Location: PACU  Anesthesia Type:General  Level of Consciousness: awake  Airway & Oxygen Therapy: Patient Spontanous Breathing  Post-op Assessment: Report given to RN  Post vital signs: Reviewed and stable  Last Vitals:  Vitals Value Taken Time  BP 153/90 10/03/20 0830  Temp    Pulse 77 10/03/20 0835  Resp 13 10/03/20 0835  SpO2 100 % 10/03/20 0835  Vitals shown include unvalidated device data.  Last Pain:  Vitals:   10/03/20 0320  TempSrc: Oral  PainSc:       Patients Stated Pain Goal: 0 (47/15/95 [id number])  Complications: No notable events documented.

## 2020-10-03 NOTE — Anesthesia Procedure Notes (Signed)
Procedure Name: General with mask airway Date/Time: 10/03/2020 7:24 AM Performed by: Barrington Ellison, CRNA Pre-anesthesia Checklist: Patient identified, Emergency Drugs available, Suction available, Patient being monitored and Timeout performed Patient Re-evaluated:Patient Re-evaluated prior to induction Oxygen Delivery Method: Circle system utilized Preoxygenation: Pre-oxygenation with 100% oxygen Induction Type: Tracheostomy Tube size: 5.5 mm Airway Equipment and Method: Tracheostomy Placement Confirmation: breath sounds checked- equal and bilateral and positive ETCO2 Tube secured with: Tape

## 2020-10-03 NOTE — Progress Notes (Signed)
CSW spoke with MD who confirms that patient cannot be discharged to the facility today due to her requiring an additional procedure tomorrow.  CSW notified Stacy Moran at [facility] of information - patient is able to come to the facility once medically cleared for discharge.  [name], MSW, LCSW Transitions of Care  Clinical Social Worker II [phone]

## 2020-10-04 ENCOUNTER — Encounter (HOSPITAL_COMMUNITY): Payer: Self-pay | Admitting: Otolaryngology

## 2020-10-04 MED ORDER — GUAIFENESIN 100 MG/5ML PO SOLN
10.0000 mL | ORAL | Status: DC | PRN
  Administered 2020-10-05 (×5): 200 mg
  Filled 2020-10-04: qty 10
  Filled 2020-10-04: qty 5
  Filled 2020-10-04: qty 10
  Filled 2020-10-04: qty 5
  Filled 2020-10-04: qty 10
  Filled 2020-10-04: qty 5

## 2020-10-04 NOTE — TOC Transition Note (Addendum)
Transition of Care [facility]) - CM/SW Discharge Note   Patient Details  Name: Stacy Moran MRN: [id number] Date of Birth: Dec 11, 1955  Transition of Care [name]) CM/SW Contact:  Coralee Pesa, [place] Phone Number: 10/04/2020, 10:25 AM   Clinical Narrative:    10:50 am CSW notified that facility now unable to accept today. CSW notified MD, nurse and family. They stated they can take pt on Monday. SW will continue to follow for DC. Pt to be transported via [facility] to [facility].  When DC is in, Nurse to call report to [phone].  Final next level of care: Skilled Nursing Facility Barriers to Discharge: Barriers Resolved   Patient Goals and CMS Choice   CMS Medicare.gov Compare Post Acute Care list provided to:: Patient Represenative (must comment) Choice offered to / list presented to : Adult Children  Discharge Placement              Patient [facility] bed at: [facility] Patient to be transferred to facility by: [place] Name of family member notified: [name] Patient and family notified of of transfer: 10/04/20  Discharge Plan and Services In-house Referral: Clinical Social Work Discharge Planning Services: CM Consult Post Acute Care Choice: [place]                               Social Determinants of Health (SDOH) Interventions     Readmission Risk Interventions No flowsheet data found.

## 2020-10-04 NOTE — Final Progress Note (Addendum)
Family Medicine Teaching Service Daily Progress Note Intern Pager: [phone]  Patient name: Stacy Moran Medical record number: [id number] Date of birth: 09-Sep-1955 Age: 65 y.o. Gender: female  Primary Care Provider: [name], MD Consultants: SLP, ENT Code Status: Full  Pt Overview and Major Events to Date:  5/31 admitted 7/8 direct laryngoscopy with esophagoscopy 7/9 repeat endoscopic fiberoptic laryngoscopy  Assessment and Plan: Stacy Moran is a 65 year old female originally admitted for acute on chronic SDH secondary to fall (now resolved).  After undergoing ENT evaluation for dysphagia, is now medically stable and cleared for SNF placement.  Dysphagia with trach collar S/p endoscopic procedure and ordered 7/8 and repeat bedside procedure 7/9 with Dr. Constance Holster.  ENT cannot visualize any structural abnormalities that would cause dysphagia.  ENT wonders if dysphagia related to "cognitive/neurologic/functional". - Currently undergoing tube feeding through G-tube - Diet recommendations per SLP - Awaiting SNF placement  FEN/GI: N.p.o., G-tube feeds PPx: Lovenox Dispo: SNF in 2 to 3 days (plan for Monday for SNF admissions team).   Subjective:  Stacy Moran in bed this morning, no complaints.  Eager to go to SNF.  No complaints, reports good bowel movements.  She appears to be in good spirits today and is joking with resident.  Objective: Temp:  [98 F (36.7 C)-98.7 F (37.1 C)] 98.1 F (36.7 C) (07/09 1111) Pulse Rate:  [67-88] 67 (07/09 1111) Resp:  [16-20] 20 (07/09 1111) BP: (99-142)/(63-88) 142/81 (07/09 1111) SpO2:  [95 %-100 %] 98 % (07/09 1518) FiO2 (%):  [21 %] 21 % (07/09 1518) Physical Exam: General: Awake, alert, whisper speech due to trach collar, laughing and smiling, cracking jokes Cardiovascular: Skin warm and well perfused, brisk cap refill Respiratory: Unlabored respirations, no respiratory distress  Laboratory: Recent Labs  Lab  09/29/20 0157 10/03/20 0321  WBC 4.6  --   HGB 11.9*  --   HCT 37.5  --   PLT 143* 146*   Recent Labs  Lab 09/29/20 0157  NA 138  K 4.1  CL 101  CO2 30  BUN 21  CREATININE 0.54  CALCIUM 10.0  GLUCOSE 128*    Imaging/Diagnostic Tests: None last 24 hours.   Ezequiel Essex, MD 10/04/2020, 4:18 PM PGY-2, [place] Intern pager: [phone], text pages welcome

## 2020-10-04 NOTE — Progress Notes (Signed)
Speech Language Pathology Treatment: Dysphagia;[place] Speaking valve  Patient Details Name: Stacy Moran MRN: [id number] DOB: 04-Aug-1955 Today's Date: 10/04/2020 Time: 1607-3710 SLP Time Calculation (min) (ACUTE ONLY): 37 min  Assessment / Plan / Recommendation Clinical Impression  Stacy Moran seen in conjunction with ENT who performed laryngoscope with pt sitting edge of bed and PMV donned. Visualization made difficult due to secretions, pt movement and edema. Due to discomfort pt extended trunk away from scope moving toward horizontal position; SLP assisted to bring her to forward sitting. Her arytenoids, false cords significantly edematous (refer to ENT note) with brief visualization of true cords with thick, mostly clear secretions obscuring view. Glottis was small, narrow and attempts for widening/opening cueing pt for inhalations could not be seen due to mucous and pt reflexively clearing her throat. Possible minimal improved sensation compared to FEES 6/15. As pt initiates throat clear and with cues for strong cough, can clear supraglottal region bringing mucous to oral cavity and suctioned. When swallowed or at rest, secretions quickly return to supraglottis. Cues to vocalize with partial adduction viewed momentarily and ENT removed scope.  Refer to ENT report -ENT stated to therapist "there does not appear to be any structural reason for dysphagia."  PO's not given after procedure; had lidocaine for laryngoscope and pt unable to clear secretions. Focused on PMV training. Therapist placed the leash attaching to velcro on neck strap. Using mirror, verbal and visual demonstration to stabilize trach with left hand and doff with right was successful. Dexterity, visualization impacted donning and she was unable after multiple attempts. Valve removed and pt reclined to nap.   She is being discharged today to [facility] facility. Plan to reach out to SLP at facility if able.    HPI  HPI: Pt is a 65 year old woman with who presented with acute worsening of subdural hematomas after fall. MRI brain 5/31: "Unchanged bilateral subdural hematomas and trace rightward midline shift." Neurosurgery considering increasing the setting on her shunt as of 5/31. Pt d/c from CIR NPO with PEG has not comlpeted any SLP therapy or swallow evaluations since 05/20/20. At that time pt still not tolearting PMSV well. 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  MBS 02/19/20 profound dysphagia with abnormal anatomy. Repeat MBS 02/26/21 minimal improvement- decr edema however significant subglottic edema/stenosis narrowing subglottic space. Bolus stops at UES with minimal opening with aspiration during/post swalow- continue NPO. MBS 04/09/20: severe pharyngoesophageal dysphagia with minimal improvements. subglottic edema was decreased but UES function continued to be significantly impaired in addition to tongue base retraction and pharyngeal contraction all contributing to pharyngeal stasis. Pt was also seen ENT. On 02/29/20 Dr Constance Holster performed laryngoscopy reporting "The cords were thickened and swollen.  There is significant subglottic swelling especially on the left side and posteriorly.  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  As the anesthesia was wearing off the cords were inspected.  Deep suctioning through the tracheostomy was used to elicit a cough.  There definitely seem to be movement of the right cord.  The left cord may be paretic but it is a little hard to tell... Over the next several months we can continue evaluation to see how the subglottic airway grasses.  She may need future procedures to open the subglottis if necessary"      SLP Plan  Continue with current plan of care;Other (Comment)  Recommendations  Diet recommendations: NPO Medication  Administration: Via alternative means      Patient may use Passy-Muir Speech Valve: During all waking hours (remove during sleep) PMSV Supervision: Intermittent         Oral Care Recommendations: Oral care QID Follow up Recommendations:  (outpatient if help at home? if not, skilled) SLP Visit Diagnosis: Dysphagia, unspecified (R13.10) Plan: Continue with current plan of care;Other (Comment)       GO                Houston Siren 10/04/2020, 9:21 AM

## 2020-10-04 NOTE — Plan of Care (Signed)

## 2020-10-04 NOTE — Progress Notes (Signed)
Repeat endoscopic fiberoptic laryngoscopy:  Topical Afrin/Xylocaine spray was used in the nasal cavities.  She did allow me to proceed with the procedure at this time.  I was able to visualize the hypopharynx and larynx.  The larynx is filled with secretions.  She has a very difficult time clearing them.  The arytenoids are massively swollen.  The supraglottic larynx is all quite swollen.  It makes it very difficult to visualize the cords.  I can see small sections of the true cords.  I was not able to assess any mobility any better with this examination.  Speech pathology was present during the evaluation.  She has made significant advances in her overall neurologic status.  She is walking and talking them.  I think a lot of the trouble now is cognitive/neurologic/functional.  I do not know of any medical or surgical treatment that will help this but hopefully in time she will regain additional function.

## 2020-10-04 NOTE — Progress Notes (Signed)
Family Medicine Teaching Service Daily Progress Note Intern Pager: [phone]  Patient name: Stacy Moran Medical record number: [id number] Date of birth: Aug 18, 1955 Age: 65 y.o. Gender: female  Primary Care Provider: [name], MD Consultants: ENT   Code Status: Full Code  Pt Overview and Major Events to Date:  5/31 admitted 7/8 direct laryngoscopy with esophagoscopy 7/9 repeat endoscopic fiberoptic laryngoscopy  Assessment and Plan: Stacy Moran is a 65 y.o. 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  After undergoing ENT evaluation for dysphagia, is now medically stable and cleared for SNF placement.   Dysphagia with trach collar S/p endoscopic procedure 7/8 and repeat bedside procedure 7/9 with Dr. Constance Holster.  No structural abnormalities seen that would cause dysphagia. Possibly cognitive, neurologic, or functional per ENT. Last night was having increase in secretions and coughing. - continue tube feeds via G-tube - guaifenesin prn for secretions - diet recommendations per SLP - awaiting SNF placement  FEN/GI: NPO, G-tube feeds PPx: LMWH  Disposition: SNF in 1-2 days (plan for Monday for SNF admissions team)  Subjective:  Last night, patient started to have more secretions and coughing despite suctioning. Otherwise no concerns.  Objective: Temp:  [98 F (36.7 C)-99.1 F (37.3 C)] 98 F (36.7 C) (07/10 0340) Pulse Rate:  [67-88] 82 (07/10 0340) Resp:  [18-20] 20 (07/10 0340) BP: (101-142)/(74-89) 141/89 (07/10 0340) SpO2:  [98 %-100 %] 100 % (07/10 0340) FiO2 (%):  [21 %] 21 % (07/09 2013) Physical Exam: General: resting comfortably in bed, in good spirits, trach collar in place, NAD Cardiovascular: RRR, no murmurs Respiratory: transmitted upper airway sounds, no respiratory distress Abdomen: soft, non-tender Extremities: WWP, no edema  Laboratory: Recent Labs  Lab 09/29/20 0157 10/03/20 0321  WBC 4.6  --   HGB  11.9*  --   HCT 37.5  --   PLT 143* 146*   Recent Labs  Lab 09/29/20 0157  NA 138  K 4.1  CL 101  CO2 30  BUN 21  CREATININE 0.54  CALCIUM 10.0  GLUCOSE 128*      Imaging/Diagnostic Tests: No results found.   Zola Button, MD 10/05/2020, 6:48 AM PGY-2, [facility] Intern pager: [phone], text pages welcome

## 2020-10-05 NOTE — Progress Notes (Signed)
Pt producing copious amount of secretions, MD on call made aware and he came to see the pt, ordered PRN med for cough which was administered

## 2020-10-05 NOTE — Plan of Care (Signed)

## 2020-10-05 NOTE — Progress Notes (Signed)
FPTS Interim Progress Note  S: [name]'s only complaint this evening is increased secretions which have required suctioning.  She recently got a dose of Mucinex which she believes helps somewhat.  Is requesting that we send her out with a prescription for Mucinex when she is discharged to SNF hopefully on 7/11.  O: BP 115/90 (BP Location: Right Arm)   Pulse (!) 102   Temp 98.4 F (36.9 C) (Oral)   Resp 19   Wt 59.7 kg   SpO2 100%   BMI 21.24 kg/m   General: Resting comfortably in bed, smiling, trach collar in place  Cardio: Regular rate, regular rhythm, no murmurs, rubs, gallops Respiratory: Diffuse coarse lung sounds, transmitted upper airway sounds, breathing comfortably with humidified air via trach Abdomen: Bowel sounds present, nontender  A/P: Stacy Moran is a 65 year old female who was initially admitted for acute on chronic subdural hematoma secondary to a fall, this is now resolved.  She has remained in the hospital for several weeks now pending SNF placement.  She is medically stable and cleared for SNF. - Continue tube feeds - guaifenesin for secretions - Stable for discharge to SNF 7/11  Eppie Gibson, MD 10/06/2020, 12:13 AM PGY-1, [facility] Service pager [phone]

## 2020-10-06 LAB — CBC WITH DIFFERENTIAL/PLATELET
Abs Immature Granulocytes: 0.01 10*3/uL (ref 0.00–0.07)
Basophils Absolute: 0 10*3/uL (ref 0.0–0.1)
Basophils Relative: 0 %
Eosinophils Absolute: 0 10*3/uL (ref 0.0–0.5)
Eosinophils Relative: 1 %
HCT: 39.6 % (ref 36.0–46.0)
Hemoglobin: 12.5 g/dL (ref 12.0–15.0)
Immature Granulocytes: 0 %
Lymphocytes Relative: 31 %
Lymphs Abs: 1.5 10*3/uL (ref 0.7–4.0)
MCH: 25.7 pg — ABNORMAL LOW (ref 26.0–34.0)
MCHC: 31.6 g/dL (ref 30.0–36.0)
MCV: 81.5 fL (ref 80.0–100.0)
Monocytes Absolute: 0.4 10*3/uL (ref 0.1–1.0)
Monocytes Relative: 7 %
Neutro Abs: 3.1 10*3/uL (ref 1.7–7.7)
Neutrophils Relative %: 61 %
Platelets: DECREASED 10*3/uL (ref 150–400)
RBC: 4.86 MIL/uL (ref 3.87–5.11)
RDW: 14.5 % (ref 11.5–15.5)
WBC: 5 10*3/uL (ref 4.0–10.5)
nRBC: 0 % (ref 0.0–0.2)

## 2020-10-06 LAB — BASIC METABOLIC PANEL
Anion gap: 8 (ref 5–15)
BUN: 19 mg/dL (ref 8–23)
CO2: 28 mmol/L (ref 22–32)
Calcium: 9.9 mg/dL (ref 8.9–10.3)
Chloride: 104 mmol/L (ref 98–111)
Creatinine, Ser: 0.66 mg/dL (ref 0.44–1.00)
GFR, Estimated: >60 mL/min (ref >60)
Glucose, Bld: 203 mg/dL — ABNORMAL HIGH (ref 70–99)
Potassium: 3.7 mmol/L (ref 3.5–5.1)
Sodium: 140 mmol/L (ref 135–145)

## 2020-10-06 LAB — CBC
HCT: 36 % (ref 36.0–46.0)
Hemoglobin: 11.3 g/dL — ABNORMAL LOW (ref 12.0–15.0)
MCH: 26 pg (ref 26.0–34.0)
MCHC: 31.4 g/dL (ref 30.0–36.0)
MCV: 82.9 fL (ref 80.0–100.0)
Platelets: 169 10*3/uL (ref 150–400)
RBC: 4.34 MIL/uL (ref 3.87–5.11)
RDW: 14.7 % (ref 11.5–15.5)
WBC: 4.9 10*3/uL (ref 4.0–10.5)
nRBC: 0 % (ref 0.0–0.2)

## 2020-10-06 LAB — SARS CORONAVIRUS 2 (TAT 6-24 HRS): SARS Coronavirus 2: NEGATIVE

## 2020-10-06 NOTE — Progress Notes (Signed)
Physical Therapy Treatment Patient Details Name: Stacy Moran MRN: [id number] DOB: [birth year] Today's Date: 10/06/2020    History of Present Illness 65 y/o female presented to ED on 5/31 with generalized waekness and L arm pain/weakness s/p fall down the stairs. 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 Radiographs negative for acute fxs. PMH: SAH s/p PICA aneurysm clipping, bilateral SDH, seizures, chronic tracheostomy, PEG, seizures, HTN, GERD, anxiety    PT Comments     Today's skilled session continued to focus on mobility. Pt with only two episodes of nearly hitting objects on right needing cues to attend to right side this session. Acute PT to continue during pt's hospital stay.     Follow Up Recommendations  SNF     Equipment Recommendations  None recommended by PT    Precautions / Restrictions Precautions Precautions: Fall Precaution Comments: trach, PEG Restrictions Weight Bearing Restrictions: No    Mobility  Bed Mobility Overal bed mobility: Needs Assistance Bed Mobility: Supine to Sit;Sit to Supine Rolling: Min guard   Supine to sit: Supervision     General bed mobility comments: supervision for safety due to impulsivity    Transfers Overall transfer level: Needs assistance Equipment used: Rolling walker (2 wheeled) Transfers: Sit to/from Stand Sit to Stand: Min guard         General transfer comment: min guard for safety due to impulsivity with cues for hand placement with transfers  Ambulation/Gait Ambulation/Gait assistance: Min guard;Min assist Gait Distance (Feet): 100 Feet Assistive device: Rolling walker (2 wheeled) Gait Pattern/deviations: Step-through pattern;Drifts right/left;Decreased stride length Gait velocity: decreased   General Gait Details: cues to stay within walker, avoid objects on right and to keep both hands on walker during gait. min assist at times for  walker management          Cognition Arousal/Alertness: Awake/alert Behavior During Therapy: Impulsive;WFL for tasks assessed/performed Overall Cognitive Status: History of cognitive impairments - at baseline Area of Impairment: Safety/judgement;Problem solving;Following commands                       Following Commands: Follows one step commands with increased time;Follows one step commands consistently Safety/Judgement: Decreased awareness of deficits;Decreased awareness of safety Awareness: Intellectual Problem Solving: Difficulty sequencing;Requires verbal cues;Requires tactile cues General Comments: follows commands at times if she agrees with them. Impulsive this session, leaving RW behind numerous times despite cueing or dancing/letting go of walker with gait.       Pertinent Vitals/Pain Pain Assessment: No/denies pain     PT Goals (current goals can now be found in the care plan section) Acute Rehab PT Goals Patient Stated Goal: patient would like to go home PT Goal Formulation: With patient Time For Goal Achievement: 10/10/20 Potential to Achieve Goals: Good Progress towards PT goals: Progressing toward goals    Frequency    Min 2X/week      PT Plan Current plan remains appropriate    AM-PAC PT "6 Clicks" Mobility   Outcome Measure  Help needed turning from your back to your side while in a flat bed without using bedrails?: None Help needed moving from lying on your back to sitting on the side of a flat bed without using bedrails?: A Little Help needed moving to and from a bed to a chair (including a wheelchair)?: A Little Help needed standing up from a chair using your arms (e.g., wheelchair or bedside chair)?: A Little  Help needed to walk in hospital room?: A Little Help needed climbing 3-5 steps with a railing? : A Little 6 Click Score: 19    End of Session Equipment Utilized During Treatment: Gait belt Activity Tolerance: Patient  tolerated treatment well Patient left: in bed;with bed alarm set;with call bell/phone within reach Nurse Communication: Mobility status PT Visit Diagnosis: Other abnormalities of gait and mobility (R26.89);Unsteadiness on feet (R26.81);Difficulty in walking, not elsewhere classified (R26.2);Other symptoms and signs involving the nervous system (T24.580)     Time: 1355-1411 PT Time Calculation (min) (ACUTE ONLY): 16 min  Charges:  $Gait Training: 8-22 mins                    Willow Ora, PTA, CLT Acute [employer] [phone] 10/06/20, 2:33 PM    Willow Ora 10/06/2020, 2:33 PM

## 2020-10-06 NOTE — Discharge Summary (Addendum)
[facility] Discharge Summary  Patient name: Stacy Moran Medical record number: [id number] Date of birth: 1955-08-20 Age: 65 y.o. 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    Disposition: Able to be discharged to SNF  Discharge Condition: Stable  Discharge Exam:   Gen: Alert, NAD Neck:  Trach collar in place Heart: RRR, Normal heart sounds Lungs:  Upper airway sounds, Lungs CTAB, breathing comfortably Neuro: Oriented x 4, Moving all 4 extremities spontaneously  Brief Hospital Course:  Stacy Moran is a 65 y.o. female presenting s/p fall admitted for increased weakness and pain since fall this morning. 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  Fall 2/2 Unsteady Gait  In ED had x-rays performed of left and right upper extremities without acute findings or fracture. PT/OT evaluated patient during admission and recommended SNF placement. Family is no longer able to care for patient at home and were agreeable with SNF placement. Patient did require safety sitter initally and safety restraints due to decreased staffing and high fall risk. Patient was able to remain off restraints for 3 weeks prior to discharge.  Delirium resolved and patient alert and oriented x4 by time of discharge.  Acute on Chronic Subdural Hematoma  Initially, there was concern for stroke in ED and code stroke was called. Head CT concerning for acute on chronic SDH.  Neurosurgery was contacted in ED.  Neurology felt  findings were less suggestive of stroke. MRI brain with unchanged subdural hematomas and trace rightward midline shift. CT head was repeated due to concern for increased agitation, which showed unchanged hematomas and slightly decreased mass effect on the ventricles with 66mm rightward midline shift. Strata valve was adjusted to 2.0 (previously 1.5) and neurology signed-off.  PEG tube dependent  Trach Collar  Dysphagia Patient was working with speech therapy with minimal improvement and recommendation for ENT consultation. ENT was consulted, as she was being followed in the outpatient setting by Dr. Constance Holster. While hospitalized, ENT performed direct laryngoscopy with esophagascopy on 10/03/20 and fiberoptic laryngoscopy on 10/04/20 with recommendations for no surgical intervention at this time and to continue PEG tube feeds in the mean time. Modified barium swallow study performed 7/12, demonstrated improved dysphagia. Recommend continued NPO but continued work with speech therapy given improvement.   Goals of Care  SNF Placement  Family unable to continue caring for patient at home given need for supervision. Patient was started on subcutaneous heparin for VTE prophylaxis. Palliative care was consulted for goals of care discussion and symptom management. After discussions with palliative medicine, patient code status was initially made DNR by daughter.  Once patient delirium resolved indicated she no longer wished to be DNR and wanted to be full code.  Palliative care to follow-up outpatient.  HTN BP's increased in ED up to 563'O systolic, 756'E diastolic. Briefly on clevidipine drip in ED for stabilization but able to be transitioned back to her home Amlodipine. Patient remained stable on Amlodipine for remainder of hospitalization. Stopped Hydralazine and Labetalol on discharge from hospital.   Hx Seizure  Although no witnessed seizure was given  1g Keppra bolus in ED and home Keppra increased to 1000 mg  daily. No seizures occurred during hospitalization.   All other chronic conditions remained stable during hospitalization.   Issues for follow-up: Follow-up with neurology outpatient Robinol was discontinued and patient placed on mucinex for secretions, continue to monitor appropriateness. Follow up with your ENT doctor in 1 month. Chronic secretions with Trach collar-  Continue to monitor for worsening or improvement Modified barium swallow performed by SLP on day of discharge.  Continue working with Speech therapy and monitor improvement.   Significant Procedures: Direct Laryngoscopy w/ Esophagoscapy,   Significant Labs and Imaging:  Recent Labs  Lab 10/03/20 0321 10/06/20 0015 10/06/20 0801  WBC  --  5.0 4.9  HGB  --  12.5 11.3*  HCT  --  39.6 36.0  PLT 146* PLATELET CLUMPS NOTED ON SMEAR, COUNT APPEARS DECREASED 169   Recent Labs  Lab 10/06/20 0015  NA 140  K 3.7  CL 104  CO2 28  GLUCOSE 203*  BUN 19  CREATININE 0.66  CALCIUM 9.9    DG Shoulder Right  Result Date: 08/26/2020 CLINICAL DATA:  Right shoulder pain after fall today. EXAM: RIGHT SHOULDER - 2+ VIEW COMPARISON:  None. FINDINGS: There is no evidence of fracture or dislocation. There is no evidence of arthropathy or other focal bone abnormality. Soft tissues are unremarkable. IMPRESSION: Negative. Electronically Signed   By: Marijo Conception M.D.   On: 08/26/2020 15:56   DG Forearm Left  Result Date: 08/26/2020 CLINICAL DATA:  Fall, left wrist and forearm pain EXAM: LEFT FOREARM - 2 VIEW; LEFT WRIST - COMPLETE 3+ VIEW COMPARISON:  None. FINDINGS: No evidence of acute fracture or dislocation involving the left forearm or left wrist. 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 IV tubing is present at the ulnar aspect of the wrist. No focal soft tissue swelling. IMPRESSION: 1. No evidence of acute fracture or dislocation involving the left forearm or left wrist. 2.  Lunotriquetral coalition. Electronically Signed   By: Davina Poke D.O.   On: 08/26/2020 14:20   DG Wrist 2 Views Right  Result Date: 08/26/2020 CLINICAL DATA:  Right wrist pain after fall. EXAM: RIGHT WRIST - 2 VIEW COMPARISON:  None. FINDINGS: There is no evidence of fracture or dislocation. There is no evidence of arthropathy or other focal bone abnormality. Soft tissues are unremarkable. IMPRESSION: Negative. Electronically Signed   By: Marijo Conception M.D.   On: 08/26/2020 15:56   DG Wrist Complete Left  Result Date: 08/26/2020 CLINICAL DATA:  Fall, left wrist and forearm pain EXAM: LEFT FOREARM - 2 VIEW; LEFT WRIST - COMPLETE 3+ VIEW COMPARISON:  None. FINDINGS: No evidence of acute fracture or dislocation involving the left forearm or left wrist. 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 IV tubing is present at the ulnar aspect of the wrist. No focal soft tissue swelling. IMPRESSION: 1. No evidence of acute fracture or dislocation involving the left forearm or left wrist. 2. Lunotriquetral coalition. Electronically Signed   By: Davina Poke D.O.   On: 08/26/2020 14:20   CT HEAD WO CONTRAST  Result Date: 08/27/2020 CLINICAL DATA:  Subdural hematoma EXAM: CT HEAD WITHOUT CONTRAST TECHNIQUE: Contiguous axial images were obtained from the base of the skull through the vertex without intravenous contrast. COMPARISON:  08/26/2020 FINDINGS: Brain: Bilateral convexity subdural hematomas, measuring 10 mm on the right and 14 mm on the left,  unchanged. There is a right frontal approach shunt catheter. Mass effect on the ventricles is slightly decreased. Rightward midline shift measures 4 mm. Vascular: Negative Skull: Left suboccipital craniotomy. Sinuses/Orbits: Negative Other: None IMPRESSION: 1. Unchanged size of bilateral convexity subdural hematomas. 2. Slightly decreased mass effect on the ventricles with 4 mm rightward midline shift.  Electronically Signed   By: Ulyses Jarred M.D.   On: 08/27/2020 22:45   CT Cervical Spine Wo Contrast  Result Date: 08/26/2020 CLINICAL DATA:  Fall down stairs EXAM: CT CERVICAL SPINE WITHOUT CONTRAST TECHNIQUE: Multidetector CT imaging of the cervical spine was performed without intravenous contrast. Multiplanar CT image reconstructions were also generated. COMPARISON:  None. FINDINGS: Alignment: Mild retrolisthesis at C4-C5 and C5-C6. Skull base and vertebrae: Degenerative plate irregularity at C4-C5 and C5-C6. Vertebral body heights are otherwise maintained. No acute cervical spine fracture. Probable prior partial resection of the posterior arch of C1. Soft tissues and spinal canal: No prevertebral fluid or swelling. No visible canal hematoma. Disc levels: Multilevel degenerative changes are present including disc space narrowing, endplate osteophytes, and facet and uncovertebral hypertrophy. These changes are greatest at C4-C5 and C5-C6. Upper chest: No apical lung mass.  Tracheostomy device is present. Other: Unremarkable. IMPRESSION: No acute cervical spine fracture. Electronically Signed   By: Macy Mis M.D.   On: 08/26/2020 11:31   MR BRAIN WO CONTRAST  Result Date: 08/26/2020 CLINICAL DATA:  Intracranial hemorrhage. History of ruptured aneurysm. Fall. EXAM: MRI HEAD WITHOUT CONTRAST TECHNIQUE: Multiplanar, multiecho pulse sequences of the brain and surrounding structures were obtained without intravenous contrast. COMPARISON:  None. FINDINGS: Examination degraded by motion and susceptibility effects related to shunt device. Brain: Bilateral subdural hematomas are unchanged, measuring approximately 9 mm on the right and 16 mm on the left. Trace rightward midline shift is unchanged. No hydrocephalus. Right frontal approach shunt catheter tip is at the right foramen of Monro. Multiple old small vessel infarcts. There is multifocal periventricular white matter hyperintensity, most often a result of  chronic microvascular ischemia. Vascular: Normal flow voids. Skull and upper cervical spine: Normal marrow signal. Sinuses/Orbits: Paranasal sinuses and mastoids are free of fluid. Absent left lobe. Other: None IMPRESSION: 1. Motion degraded examination. 2. Unchanged bilateral subdural hematomas and trace rightward midline shift. 3. Right frontal approach shunt catheter tip at the right foramen of Monro. No hydrocephalus. 4. Multiple old small vessel infarcts and findings of chronic microvascular ischemia. Electronically Signed   By: Ulyses Jarred M.D.   On: 08/26/2020 23:34   DG Pelvis Portable  Result Date: 08/26/2020 CLINICAL DATA:  Fall. EXAM: PORTABLE PELVIS 1-2 VIEWS COMPARISON:  None. FINDINGS: There is no evidence of pelvic fracture or diastasis. No pelvic bone lesions are seen. Calcified uterine fibroid is noted. IMPRESSION: No acute abnormality seen. Electronically Signed   By: Marijo Conception M.D.   On: 08/26/2020 12:25   DG Hand 2 View Right  Result Date: 08/26/2020 CLINICAL DATA:  Right hand pain after fall. EXAM: RIGHT HAND - 2 VIEW COMPARISON:  None. FINDINGS: There is no evidence of fracture or dislocation. There is no evidence of arthropathy or other focal bone abnormality. Soft tissues are unremarkable. IMPRESSION: Negative. Electronically Signed   By: Marijo Conception M.D.   On: 08/26/2020 15:54   DG Chest Portable 1 View  Result Date: 08/26/2020 CLINICAL DATA:  Fall.  Code stroke. EXAM: PORTABLE CHEST 1 VIEW COMPARISON:  07/30/2020 FINDINGS: Tracheostomy in place. 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 Heart size upper  limits of normal. Mediastinal shadows otherwise unremarkable. The lungs are clear. No edema or effusions. IMPRESSION: No active disease. Electronically Signed   By: Nelson Chimes M.D.   On: 08/26/2020 11:43   DG Humerus Left  Result Date: 08/26/2020 CLINICAL DATA:  Fall. EXAM: LEFT HUMERUS - 2+ VIEW COMPARISON:  None. FINDINGS: There is no evidence of fracture or other  focal bone lesions. Soft tissues are unremarkable. IMPRESSION: Negative. Electronically Signed   By: Marijo Conception M.D.   On: 08/26/2020 11:45   DG Humerus Right  Result Date: 08/26/2020 CLINICAL DATA:  Right arm pain after fall. EXAM: RIGHT HUMERUS - 2+ VIEW COMPARISON:  None. FINDINGS: There is no evidence of fracture or other focal bone lesions. Soft tissues are unremarkable. IMPRESSION: Negative. Electronically Signed   By: Marijo Conception M.D.   On: 08/26/2020 15:55   DG Femur Portable Min 2 Views Left  Result Date: 08/26/2020 CLINICAL DATA:  Fall. EXAM: LEFT FEMUR PORTABLE 2 VIEWS COMPARISON:  None. FINDINGS: There is no evidence of fracture or other focal bone lesions. Soft tissues are unremarkable. IMPRESSION: Negative. Electronically Signed   By: Marijo Conception M.D.   On: 08/26/2020 11:44   CT HEAD CODE STROKE WO CONTRAST  Result Date: 08/26/2020 CLINICAL DATA:  Code stroke. 65 year old female with history of left PICA aneurysm clipping, subarachnoid and subdural hemorrhage. EXAM: CT HEAD WITHOUT CONTRAST TECHNIQUE: Contiguous axial images were obtained from the base of the skull through the vertex without intravenous contrast. COMPARISON:  Head CT 07/30/2020 and earlier. FINDINGS: Brain: Persistent bilateral subdural hematomas, mixed density on the left with mildly increased hyperdense blood products on that side since 07/30/2020 (series 2, image 16). Trace para falcine hyperdense blood has also increased on image 21. The left-side subdural is mildly lobulated and measures up to 12 mm thickness, with volume of blood increased along the anterior left frontal convexity. Stable mostly low-density right subdural volume, with hematoma on that side measuring 9-10 mm in thickness. Rightward midline shift has increased from 4 mm to 8 mm. Mass effect on the ventricles has increased. A right superior frontal approach ventriculostomy catheter appears stable and in communication with the right lateral  ventricle. No ventriculomegaly. Basilar cisterns are stable. No superimposed No cortically based acute infarct identified. Stable gray-white matter differentiation throughout the brain. There is some chronic right frontal lobe encephalomalacia. Vascular: Mild Calcified atherosclerosis at the skull base. Aneurysm clip at the left pre medullary cistern is stable. Skull: Stable left suboccipital, foramen magnum craniotomy and partial C1 ring resection. No acute osseous abnormality identified. Sinuses/Orbits: Visualized paranasal sinuses and mastoids are stable and well aerated. Other: Retained secretions in the nasopharynx similar to the earlier this month. Prior resection of the left globe. Negative right orbit. 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 Stable right superior frontal vertex shunt reservoir with tubing tracking to the posterior right neck. ASPECTS [facility] Stroke Program Early CT Score) Total score (0-10 with 10 being normal): 10 IMPRESSION: 1. Acute on chronic left side subdural hematoma, increased since 07/30/2020 with new hyperdense blood products, and increased intracranial mass effect with rightward midline shift of the anterior septum pellucidum now 8 mm (previously 4 mm). Stable smaller right side subdural hematoma. 2. No acute cortically based infarct identified. 3. Stable right superior frontal approach ventriculostomy catheter with no ventriculomegaly. Stable postoperative changes including small left suboccipital pseudomeningocele. 4. These results were communicated to Dr. Curly Shores at 11:23 am on 08/26/2020 by  text page via the [facility] messaging system. Electronically Signed   By: Genevie Ann M.D.   On: 08/26/2020 11:25      EXAM: CT HEAD WITHOUT CONTRAST   TECHNIQUE: Contiguous axial images were obtained from the base of the skull through the vertex without intravenous contrast.   COMPARISON:  08/26/2020   FINDINGS: Brain:  Bilateral convexity subdural hematomas, measuring 10 mm on the right and 14 mm on the left, unchanged. There is a right frontal approach shunt catheter. Mass effect on the ventricles is slightly decreased. Rightward midline shift measures 4 mm.   Vascular: Negative   Skull: Left suboccipital craniotomy.   Sinuses/Orbits: Negative   Other: None   IMPRESSION: 1. Unchanged size of bilateral convexity subdural hematomas. 2. Slightly decreased mass effect on the ventricles with 4 mm rightward midline shift.     Electronically Signed   By: Ulyses Jarred M.D.   On: 08/27/2020 22:45   Objective Swallowing Evaluation: Type of Study: MBS-Modified Barium Swallow Study   Patient Details  Name: Stacy Moran MRN: [id number] Date of Birth: 01-27-1956   Today's Date: 08/28/2020 Time: SLP Start Time (ACUTE ONLY): 1200 -SLP Stop Time (ACUTE ONLY): 1215   SLP Time Calculation (min) (ACUTE ONLY): 15 min       Assessment / Plan / Recommendation   CHL IP CLINICAL IMPRESSIONS 08/28/2020   Pt swallowing remains profoundly impaired. She was lethargic and poorly responsive today with no active acceptance of a teaspoon of nectar thick liquids. SLP provided total assisted feeding which pt barely manipulated. After subtle lingual movement bolus gradually spilled to airway and was partially aspirated before the swallow without response. As more of the the bolus spilled and collected in the pharynx a swallow response was eventually elicited, recognizable only by minimal layrngeal elevation, hyoid excursion and laryngeal opening resulting in more aspiration without sensation. Pts anatomy is very altered. No epiglottis was appreciated. The vestibule appears closed at rest and airway patency appears generally absent. There is no UES opening. At this time potential for oral intake is very poor.  Pt needs f/u with ENT to address anatomy.        SLP Visit Diagnosis --  Attention and concentration deficit  following --  Frontal lobe and executive function deficit following --  Impact on safety and function --         CHL IP TREATMENT RECOMMENDATION 08/28/2020  Treatment Recommendations Therapy as outlined in treatment plan below       Prognosis 08/28/2020  Prognosis for Safe Diet Advancement Guarded  Barriers to Reach Goals Severity of deficits  Barriers/Prognosis Comment --      CHL IP DIET RECOMMENDATION 08/28/2020  SLP Diet Recommendations NPO;Alternative means - long-term  Liquid Administration via --  Medication Administration --  Compensations --  Postural Changes --         CHL IP OTHER RECOMMENDATIONS 08/28/2020  Recommended Consults Consider ENT evaluation  Oral Care Recommendations --  Other Recommendations --         CHL IP FOLLOW UP RECOMMENDATIONS 08/28/2020  Follow up Recommendations Home health SLP         CHL IP FREQUENCY AND DURATION 08/28/2020  Speech Therapy Frequency (ACUTE ONLY) min 2x/week  Treatment Duration 2 weeks                 CHL IP ORAL PHASE 08/28/2020  Oral Phase Impaired  Oral - Pudding Teaspoon --  Oral - Pudding Cup --  Oral -  Honey Teaspoon --  Oral - Honey Cup --  Oral - Nectar Teaspoon Left anterior bolus loss;Right anterior bolus loss;Weak lingual manipulation;Incomplete tongue to palate contact;Reduced posterior propulsion;Holding of bolus;Delayed oral transit;Decreased bolus cohesion;Premature spillage  Oral - Nectar Cup --  Oral - Nectar Straw --  Oral - Thin Teaspoon --  Oral - Thin Cup --  Oral - Thin Straw --  Oral - Puree --  Oral - Mech Soft --  Oral - Regular --  Oral - Multi-Consistency --  Oral - Pill --  Oral Phase - Comment --     CHL IP PHARYNGEAL PHASE 08/28/2020  Pharyngeal Phase Impaired  Pharyngeal- Pudding Teaspoon --  Pharyngeal --  Pharyngeal- Pudding Cup --  Pharyngeal --  Pharyngeal- Honey Teaspoon --  Pharyngeal --  Pharyngeal- Honey Cup --  Pharyngeal --  Pharyngeal- Nectar Teaspoon  Penetration/Aspiration before swallow;Penetration/Aspiration during swallow;Reduced epiglottic inversion;Reduced tongue base retraction;Reduced laryngeal elevation;Reduced anterior laryngeal mobility;Moderate aspiration  Pharyngeal --  Pharyngeal- Nectar Cup --  Pharyngeal --  Pharyngeal- Nectar Straw --  Pharyngeal --  Pharyngeal- Thin Teaspoon --  Pharyngeal --  Pharyngeal- Thin Cup --  Pharyngeal --  Pharyngeal- Thin Straw --  Pharyngeal --  Pharyngeal- Puree --  Pharyngeal --  Pharyngeal- Mechanical Soft --  Pharyngeal --  Pharyngeal- Regular --  Pharyngeal --  Pharyngeal- Multi-consistency --  Pharyngeal --  Pharyngeal- Pill --  Pharyngeal --  Pharyngeal Comment --       CHL IP CERVICAL ESOPHAGEAL PHASE 04/09/2020  Cervical Esophageal Phase Impaired  Pudding Teaspoon --  Pudding Cup --  Honey Teaspoon --  Honey Cup --  Nectar Teaspoon --  Nectar Cup --  Nectar Straw --  Thin Teaspoon --  Thin Cup --  Thin Straw --  Puree --  Mechanical Soft --  Regular --  Multi-consistency --  Pill --  Cervical Esophageal Comment --        [name] 08/28/2020, 2:07 PM    Assessment / Plan / Recommendation   CHL IP CLINICAL IMPRESSIONS 10/07/2020  Clinical Impression This was pt's 5th MBS since 02/19/20 and FEES 09/10/20. Repeat study prior to scheduled discharge to [facility] SNF today with PMV donned. Although mechanisms and function of her swallow have improved, pharyngeal and cervical esophageal dysfunction remains significant. In general the swallow onset was timilier however laryngeal elevation was not protective during swallows as epiglottic inversion was both absent and/or only partially deflected. Duration and ROM of larygneal elevation and hyoid movement was frequently reduced in addition to inadequate opening of UES allowing penetration to vocal cords during inital swallows. Vallecular and pyrifrom sinus residue was significant with penetration and  aspiration during subsequent swallows with honey, nectar and thin barium. Sensation was reduced and throat clears in response were inconsistent and ineffective. Breath hold prior to swallow and effortful swallow were not effective strategies to faciliate function. Barium observed leaking around stoma at end of swallow. Recommend she continue NPO status and continue to work with ST for facilitation of laryngeal strength and ROM.  SLP Visit Diagnosis Dysphagia, pharyngoesophageal phase (R13.14);Dysphagia, oropharyngeal phase (R13.12)  Attention and concentration deficit following --  Frontal lobe and executive function deficit following --  Impact on safety and function Moderate aspiration risk;Severe aspiration risk         CHL IP TREATMENT RECOMMENDATION 10/07/2020  Treatment Recommendations Other (Comment)       Prognosis 10/01/2020  Prognosis for Safe Diet Advancement Guarded  Barriers to Reach Goals Time post  onset;Severity of deficits;Cognitive deficits  Barriers/Prognosis Comment --      CHL IP DIET RECOMMENDATION 10/07/2020  SLP Diet Recommendations NPO  Liquid Administration via --  Medication Administration Via alternative means  Compensations --  Postural Changes --         CHL IP OTHER RECOMMENDATIONS 10/07/2020  Recommended Consults --  Oral Care Recommendations Oral care QID  Other Recommendations --         CHL IP FOLLOW UP RECOMMENDATIONS 10/07/2020  Follow up Recommendations Skilled Nursing facility         [facility] IP FREQUENCY AND DURATION 08/28/2020  Speech Therapy Frequency (ACUTE ONLY) min 2x/week  Treatment Duration 2 weeks                 CHL IP ORAL PHASE 10/07/2020  Oral Phase Impaired  Oral - Pudding Teaspoon --  Oral - Pudding Cup --  Oral - Honey Teaspoon --  Oral - Honey Cup Reduced posterior propulsion;Delayed oral transit;Decreased bolus cohesion;Right anterior bolus loss  Oral - Nectar Teaspoon Reduced posterior propulsion;Delayed oral transit;Decreased  bolus cohesion  Oral - Nectar Cup Reduced posterior propulsion;Delayed oral transit;Decreased bolus cohesion  Oral - Nectar Straw --  Oral - Thin Teaspoon --  Oral - Thin Cup Reduced posterior propulsion;Delayed oral transit;Decreased bolus cohesion  Oral - Thin Straw --  Oral - Puree --  Oral - Mech Soft --  Oral - Regular --  Oral - Multi-Consistency --  Oral - Pill --  Oral Phase - Comment --     CHL IP PHARYNGEAL PHASE 10/07/2020  Pharyngeal Phase Impaired  Pharyngeal- Pudding Teaspoon --  Pharyngeal --  Pharyngeal- Pudding Cup --  Pharyngeal --  Pharyngeal- Honey Teaspoon NT  Pharyngeal --  Pharyngeal- Honey Cup Penetration/Aspiration during swallow;Reduced pharyngeal peristalsis;Reduced epiglottic inversion;Reduced airway/laryngeal closure;Pharyngeal residue - valleculae;Pharyngeal residue - pyriform;Reduced laryngeal elevation  Pharyngeal Material enters airway, passes BELOW cords without attempt by patient to eject out (silent aspiration);Material enters airway, remains ABOVE vocal cords and not ejected out;Material enters airway, CONTACTS cords and not ejected out  Pharyngeal- Nectar Teaspoon Reduced epiglottic inversion;Reduced pharyngeal peristalsis;Reduced airway/laryngeal closure;Penetration/Aspiration during swallow;Reduced laryngeal elevation;Pharyngeal residue - valleculae;Pharyngeal residue - pyriform  Pharyngeal --  Pharyngeal- Nectar Cup Reduced epiglottic inversion;Reduced pharyngeal peristalsis;Reduced airway/laryngeal closure;Penetration/Aspiration during swallow;Reduced laryngeal elevation;Pharyngeal residue - valleculae;Pharyngeal residue - pyriform  Pharyngeal Material enters airway, passes BELOW cords without attempt by patient to eject out (silent aspiration);Material enters airway, CONTACTS cords and not ejected out  Pharyngeal- Nectar Straw --  Pharyngeal --  Pharyngeal- Thin Teaspoon --  Pharyngeal --  Pharyngeal- Thin Cup Reduced epiglottic  inversion;Reduced airway/laryngeal closure;Penetration/Aspiration during swallow;Reduced anterior laryngeal mobility;Reduced laryngeal elevation;Pharyngeal residue - pyriform;Pharyngeal residue - valleculae  Pharyngeal Material enters airway, passes BELOW cords without attempt by patient to eject out (silent aspiration)  Pharyngeal- Thin Straw --  Pharyngeal --  Pharyngeal- Puree --  Pharyngeal --  Pharyngeal- Mechanical Soft --  Pharyngeal --  Pharyngeal- Regular --  Pharyngeal --  Pharyngeal- Multi-consistency --  Pharyngeal --  Pharyngeal- Pill --  Pharyngeal --  Pharyngeal Comment --       CHL IP CERVICAL ESOPHAGEAL PHASE 10/07/2020  Cervical Esophageal Phase Impaired  Pudding Teaspoon --  Pudding Cup --  Honey Teaspoon NT  Honey Cup Reduced cricopharyngeal relaxation;Prominent cricopharyngeal segment  Nectar Teaspoon Reduced cricopharyngeal relaxation;Prominent cricopharyngeal segment  Nectar Cup Prominent cricopharyngeal segment;Reduced cricopharyngeal relaxation  Nectar Straw --  Thin Teaspoon --  Thin Cup Prominent cricopharyngeal segment;Reduced cricopharyngeal relaxation  Thin  Straw --  Puree --  Mechanical Soft --  Regular --  Multi-consistency --  Pill --  Cervical Esophageal Comment --        Houston Siren 10/07/2020, 12:39 PM    Orbie Pyo Colvin Caroli.Ed Actor Pager [phone] Office [phone]      Results/Tests Pending at Time of Discharge:  None  Discharge Medications:  Allergies as of 10/07/2020   No Known Allergies      Medication List     STOP taking these medications    clonazePAM 0.5 MG tablet Commonly known as: KLONOPIN   glycopyrrolate 1 MG tablet Commonly known as: ROBINUL   hydrALAZINE 50 MG tablet Commonly known as: APRESOLINE   labetalol 300 MG tablet Commonly known as: NORMODYNE   levETIRAcetam 750 MG tablet Commonly known as: KEPPRA Replaced by: levETIRAcetam 100 MG/ML solution    metoCLOPramide 5 MG tablet Commonly known as: Reglan   scopolamine 1 MG/3DAYS Commonly known as: TRANSDERM-SCOP       TAKE these medications    acetaminophen 325 MG tablet Commonly known as: TYLENOL Place 2 tablets (650 mg total) into feeding tube every 6 (six) hours as needed for mild pain.   amLODipine 10 MG tablet Commonly known as: NORVASC PLACE 1 TABLET (10 MG TOTAL) INTO FEEDING TUBE DAILY. What changed:  how much to take how to take this when to take this   chlorhexidine 0.12 % solution Commonly known as: PERIDEX 15 mLs by Mouth Rinse route 2 (two) times daily.   feeding supplement (OSMOLITE 1.2 CAL) Liqd Place 355 mLs into feeding tube 4 (four) times daily -  with meals and at bedtime.   feeding supplement (PROSource TF) liquid Place 45 mLs into feeding tube 2 (two) times daily.   free water Soln Place 200 mLs into feeding tube every 6 (six) hours.   levETIRAcetam 100 MG/ML solution Commonly known as: KEPPRA Place 7.5 mLs (750 mg total) into feeding tube 2 (two) times daily. Replaces: levETIRAcetam 750 MG tablet   levothyroxine 100 MCG tablet Commonly known as: SYNTHROID Place 1 tablet (100 mcg total) into feeding tube daily at 6 (six) AM.   polyethylene glycol 17 g packet Commonly known as: MIRALAX / GLYCOLAX Place 17 g into feeding tube 2 (two) times daily.   QUEtiapine 50 MG tablet Commonly known as: SEROQUEL PLACE 1 TABLET (50 MG TOTAL) INTO FEEDING TUBE 2 (TWO) TIMES DAILY. What changed:  how much to take how to take this when to take this   senna 8.6 MG Tabs tablet Commonly known as: SENOKOT Place 1 tablet (8.6 mg total) into feeding tube daily. Start taking on: October 08, 2020            Discharge Instructions: Please refer to Patient Instructions section of EMR for full details.  Patient was counseled important signs and symptoms that should prompt return to medical care, changes in medications, dietary instructions, activity  restrictions, and follow up appointments.   Follow-Up Appointments:  Contact information for after-discharge care     [facility] .   Service: Skilled Nursing Contact information: [place] [phone]                     Delora Fuel, MD 10/07/2020, 1:15 PM PGY-1, [place]

## 2020-10-06 NOTE — Plan of Care (Signed)

## 2020-10-06 NOTE — Progress Notes (Signed)
Family Medicine Teaching Service Daily Progress Note Intern Pager: [phone]  Patient name: Stacy Moran Medical record number: [id number] Date of birth: [birth year] Age: 65 y.o. Gender: female  Primary Care Provider: [name], MD Consultants: ENT Code Status: Full  Pt Overview and Major Events to Date:  08/26/20- Admitted  Assessment and Plan: Patient admitted for acute on chronic SDH.  Has dysphagia from CVA.  Now cleared by ENT for discharge, awaiting SNF placement.  Patient is medically stable for Discharge  Dysphagia No further surgical management by ENT.  Continues to be NPO and have fluid secretions -  Continue Trach management and JG tube feeds  FEN/GI: Tube feeds PPx: Lovenox Dispo:SNF today. Barriers include Trach supplies for SNF.   Subjective:  Patient indicates no new issues.  Indicates having normal amount of secretions.  Objective: Temp:  [98.1 F (36.7 C)-98.4 F (36.9 C)] 98.1 F (36.7 C) (07/11 0427) Pulse Rate:  [65-102] 78 (07/11 0427) Resp:  [16-22] 19 (07/11 0427) BP: (115-153)/(82-106) 116/82 (07/11 0427) SpO2:  [92 %-100 %] 92 % (07/11 0737) FiO2 (%):  [21 %] 21 % (07/11 0737) Physical Exam: General: NAD Respiratory: Lungs CTAB, upper secretion heard on ascultation Extremities: No edema  Laboratory: Recent Labs  Lab 10/03/20 0321 10/06/20 0015  WBC  --  5.0  HGB  --  12.5  HCT  --  39.6  PLT 146* PLATELET CLUMPS NOTED ON SMEAR, COUNT APPEARS DECREASED   Recent Labs  Lab 10/06/20 0015  NA 140  K 3.7  CL 104  CO2 28  BUN 19  CREATININE 0.66  CALCIUM 9.9  GLUCOSE 203*    Imaging/Diagnostic Tests: No new imaging  Delora Fuel, MD 10/06/2020, 7:55 AM PGY-1, [place] Intern pager: [phone], text pages welcome

## 2020-10-06 NOTE — Progress Notes (Addendum)
1:45pm: CSW spoke with [name] at [facility] who states the facility can receive this patient tomorrow between 2pm and 4pm.  CSW notified Dr. Manus Rudd of information.  12:30pm: CSW spoke with patient's daughter to provide her with an update regarding discharge plan.  12pm: CSW spoke with with [name] at [facility] who states the RN's at the facility need to receive training on trach's prior to the patient's admission. [name] will inform CSW when this training will occur as soon as that information is available.  7:50am: CSW spoke with [place] at [facility] who states she is waiting on confirmation that the appropriate trach supplies have been obtained prior to accepting the patient.  [name], MSW, LCSW Transitions of Care  Clinical Social Worker II [phone]

## 2020-10-07 ENCOUNTER — Inpatient Hospital Stay (HOSPITAL_COMMUNITY): Payer: Medicaid Other

## 2020-10-07 MED ORDER — CHLORHEXIDINE GLUCONATE 0.12 % MT SOLN: 15.0000 mL | Freq: Two times a day (BID) | OROMUCOSAL | 0 refills | Status: DC

## 2020-10-07 MED ORDER — POLYETHYLENE GLYCOL 3350 17 G PO PACK: 17.0000 g | PACK | Freq: Two times a day (BID) | ORAL | 0 refills | Status: DC

## 2020-10-07 MED ORDER — LEVETIRACETAM 100 MG/ML PO SOLN: 750.0000 mg | Freq: Two times a day (BID) | ORAL | 12 refills | Status: DC

## 2020-10-07 MED ORDER — SENNA 8.6 MG PO TABS: 1.0000 | ORAL_TABLET | Freq: Every day | ORAL | 0 refills | Status: DC

## 2020-10-07 NOTE — Progress Notes (Signed)
Patient's daughter came at 2040 to take patient to [facility]. Belongings have been returned to patient.

## 2020-10-07 NOTE — Progress Notes (Signed)
Attempted to call report to [place] 3 times and no answer from nurse to give report.

## 2020-10-07 NOTE — Progress Notes (Signed)
Pt scheduled at 1:30 to be transported to [facility] via [place]. As of this time, patient is still awaiting transport.  Kenney Houseman, with [facility] asked for the patient to held until tomorrow.  Spoke with Vergie Living, SW who spoke to [facility] who will now take the patient.  PTAR said she would not arrive until 10:30 pm.  Daughter, Boris Lown, called wanting to transport patient to facility. Again spoke with Vergie Living, who stated it was her right to be transported by daughter.  Boris Lown called and notified.

## 2020-10-07 NOTE — Progress Notes (Signed)
Modified Barium Swallow Progress Note  Patient Details  Name: Stacy Moran MRN: [id number] Date of Birth: 1956-03-03  Today's Date: 10/07/2020  Modified Barium Swallow completed.  Full report located under Chart Review in the Imaging Section.  Brief recommendations include the following:  Clinical Impression  This was pt's 5th MBS since 02/19/20 and FEES 09/10/20. Repeat study prior to scheduled discharge to [facility] SNF today with PMV donned. Although mechanisms and function of her swallow have improved, pharyngeal and cervical esophageal dysfunction remains significant. In general the swallow onset was timilier however laryngeal elevation was not protective during swallows as epiglottic inversion was both absent and/or only partially deflected. Duration and ROM of larygneal elevation and hyoid movement was frequently reduced in addition to inadequate opening of UES allowing penetration to vocal cords during inital swallows. Vallecular and pyrifrom sinus residue was significant with penetration and aspiration during subsequent swallows with honey, nectar and thin barium. Sensation was reduced and throat clears in response were inconsistent and ineffective. Breath hold prior to swallow and effortful swallow were not effective strategies to faciliate function. Barium observed leaking around stoma at end of swallow. Recommend she continue NPO status and continue to work with ST for facilitation of laryngeal strength and ROM.   Swallow Evaluation Recommendations       SLP Diet Recommendations: NPO       Medication Administration: Via alternative means               Oral Care Recommendations: Oral care QID        Houston Siren 10/07/2020,12:40 PM  Orbie Pyo Colvin Caroli.Ed Risk analyst [phone] Office [phone]

## 2020-10-07 NOTE — Progress Notes (Signed)
Attempted to call report to [facility] without success.

## 2020-10-07 NOTE — Progress Notes (Signed)
  Speech Language Pathology Treatment:    Patient Details Name: Stacy Moran MRN: [id number] DOB: 01-23-1956 Today's Date: 10/07/2020 Time:  -      MBS scheduled today to determine any improvements in swallow since last assessment                              Houston Siren 10/07/2020, 8:42 AM

## 2020-10-07 NOTE — Progress Notes (Addendum)
Patient will go to [facility] via [place] has been scheduled for pick up at 1:30pm. The number to call for report is [phone].  CSW spoke with patient's daughter [name] to inform her of discharge plan - she is agreeable.  CSW spoke with MD and RN to inform them of discharge plan.  [name], MSW, LCSW Transitions of Care  Clinical Social Worker II [phone]

## 2020-10-08 ENCOUNTER — Telehealth: Payer: Self-pay | Admitting: Internal Medicine

## 2020-10-08 NOTE — Telephone Encounter (Signed)
Copied from [place] [id number]. Topic: General - Other >> Oct 08, 2020 12:05 PM Tessa Lerner A wrote: Reason for CRM: Leah a Nurse Care Manager with [employer] has called to provide an update on patient  Lead shares that the patient was discharged to a skilled [place] on 10/07/20  Please contact further if needed

## 2020-10-08 NOTE — Telephone Encounter (Signed)
FYI

## 2020-10-10 ENCOUNTER — Other Ambulatory Visit: Payer: Self-pay | Admitting: Neurosurgery

## 2020-10-10 DIAGNOSIS — S065X9A Traumatic subdural hemorrhage with loss of consciousness of unspecified duration, initial encounter: Secondary | ICD-10-CM

## 2020-10-10 DIAGNOSIS — S065XAA Traumatic subdural hemorrhage with loss of consciousness status unknown, initial encounter: Secondary | ICD-10-CM

## 2020-10-28 ENCOUNTER — Ambulatory Visit: Payer: Self-pay | Admitting: Neurology

## 2020-11-06 ENCOUNTER — Encounter: Payer: Self-pay | Admitting: Internal Medicine

## 2020-11-10 ENCOUNTER — Telehealth: Payer: Self-pay | Admitting: Internal Medicine

## 2020-11-10 ENCOUNTER — Other Ambulatory Visit: Payer: Self-pay

## 2020-11-10 ENCOUNTER — Ambulatory Visit
Admission: RE | Admit: 2020-11-10 | Discharge: 2020-11-10 | Disposition: A | Discharge status: Home / Self Care | Payer: Medicaid Other | Source: Ambulatory Visit | Attending: Neurosurgery | Admitting: Neurosurgery

## 2020-11-10 DIAGNOSIS — S065X9A Traumatic subdural hemorrhage with loss of consciousness of unspecified duration, initial encounter: Secondary | ICD-10-CM

## 2020-11-10 DIAGNOSIS — S065XAA Traumatic subdural hemorrhage with loss of consciousness status unknown, initial encounter: Secondary | ICD-10-CM

## 2020-11-10 IMAGING — CT CT HEAD W/O CM
2 series · 15 of 30 positions shown, 17 images · non-contrast
Comparison: [DATE]

CLINICAL DATA: Subdural hematoma (HCC) [FM] ([FM]-CM). SDH
f/u hx of fall [DATE] from stairs

EXAM:
CT HEAD WITHOUT CONTRAST
TECHNIQUE: Contiguous axial images were obtained from the base of the skull
through the vertex without intravenous contrast.

[Series 2: head w/(date) · axial · 0.49mm/px · z∈[-161,-31]mm · 7 of 36 slices shown, 9 images]
[im 5/36  brain]
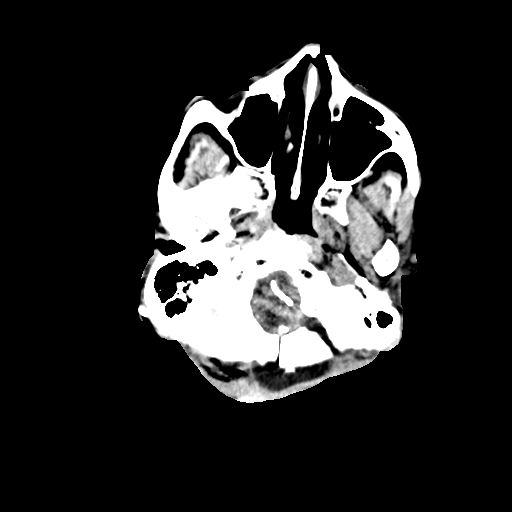
[im 5/36  bone]
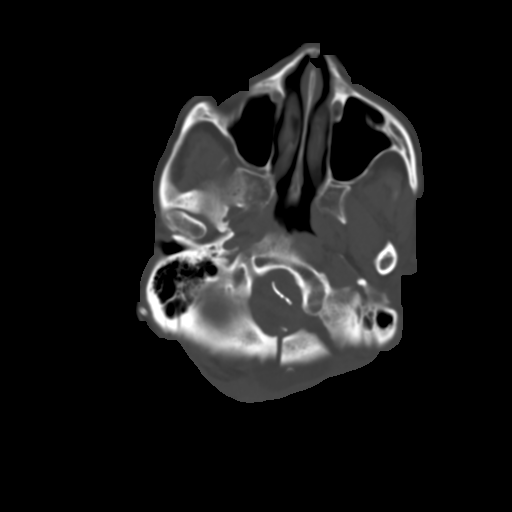
[im 9/36  brain]
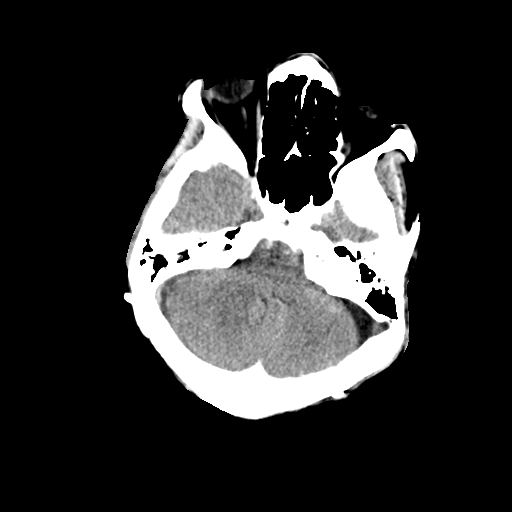
[im 14/36  brain]
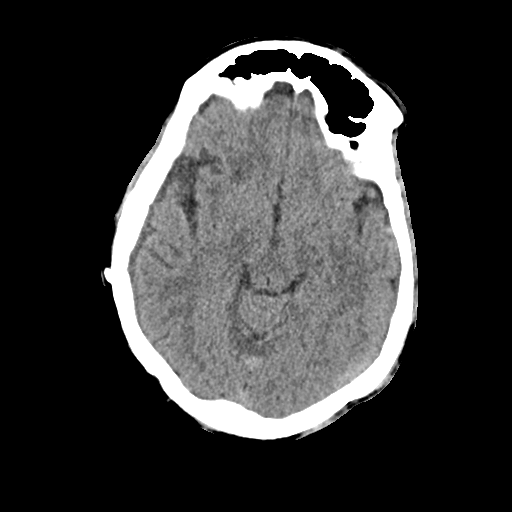
[im 18/36  brain]
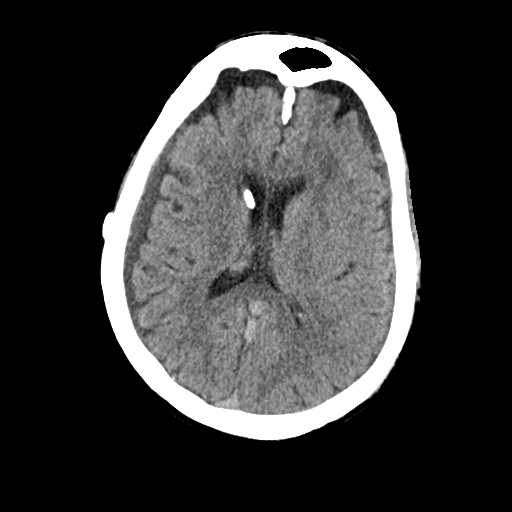
[im 22/36  brain]
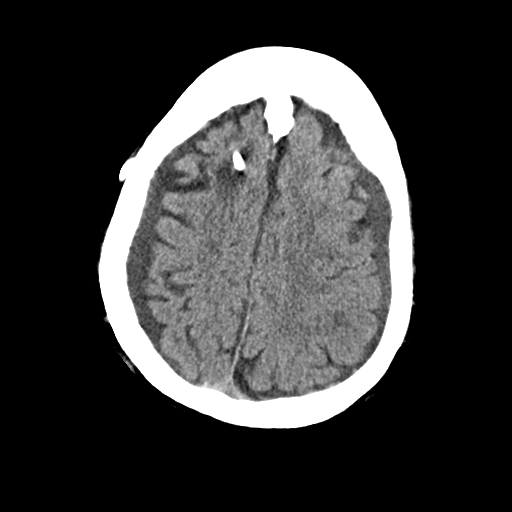
[im 22/36  bone]
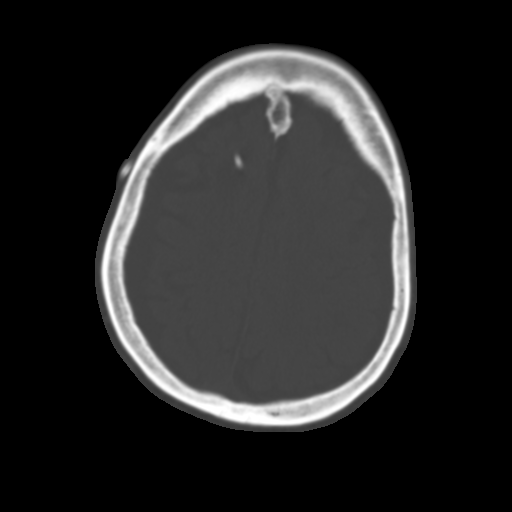
[im 27/36  brain]
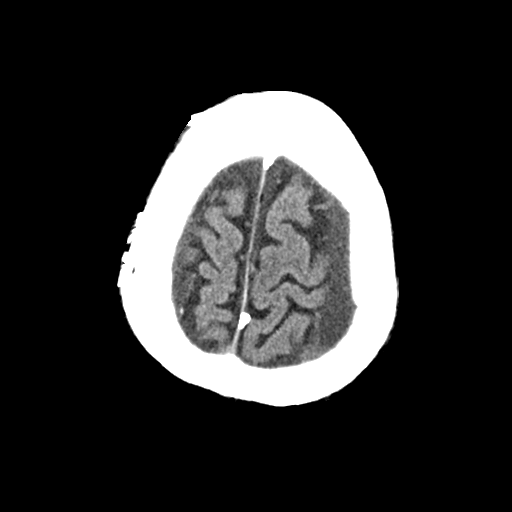
[im 31/36  brain]
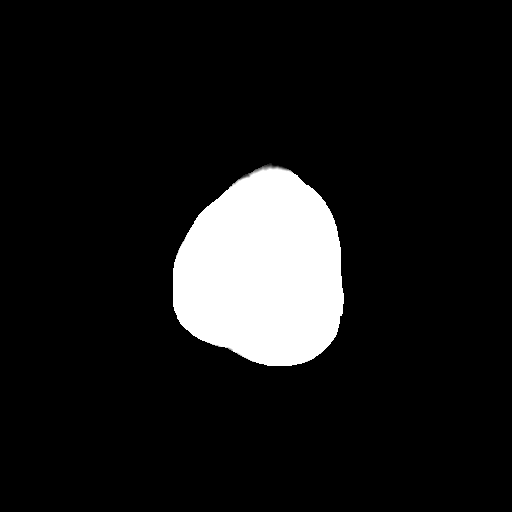

[Series 3: bone · axial · 0.49mm/px · z∈[-165,-23]mm · 8 of 89 slices shown]
[im 9/89  bone]
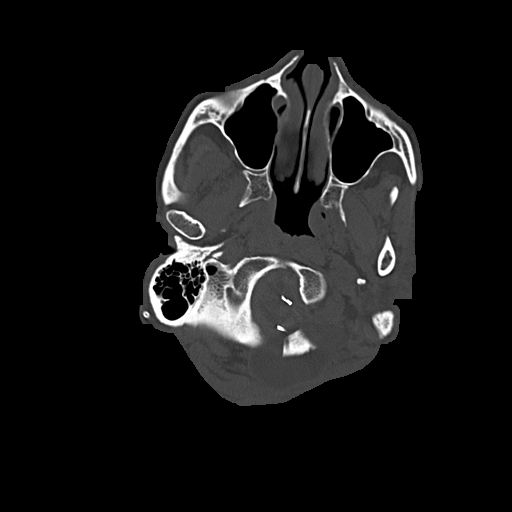
[im 18/89  bone]
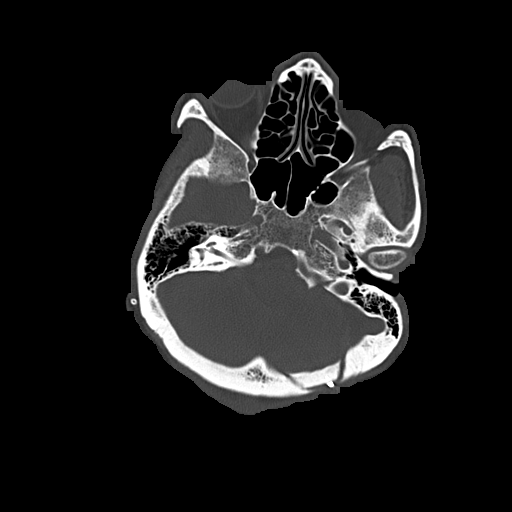
[im 27/89  bone]
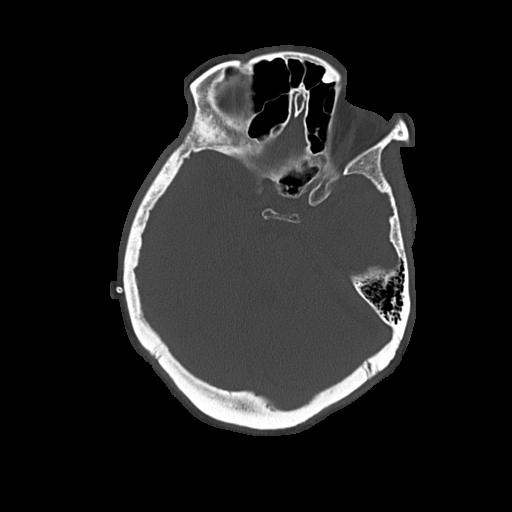
[im 40/89  bone]
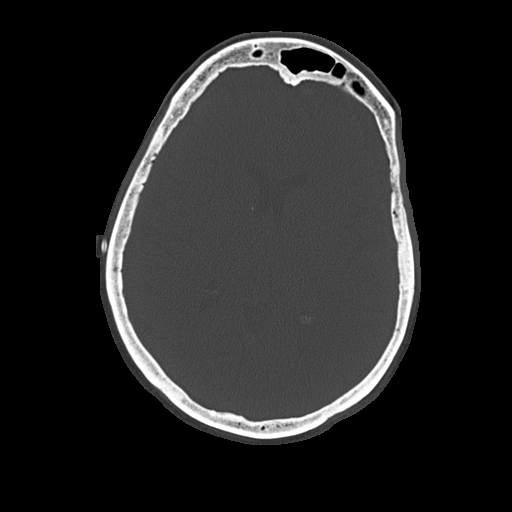
[im 49/89  bone]
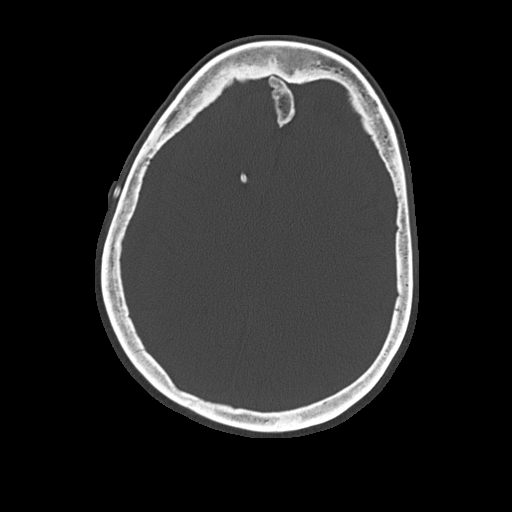
[im 62/89  bone]
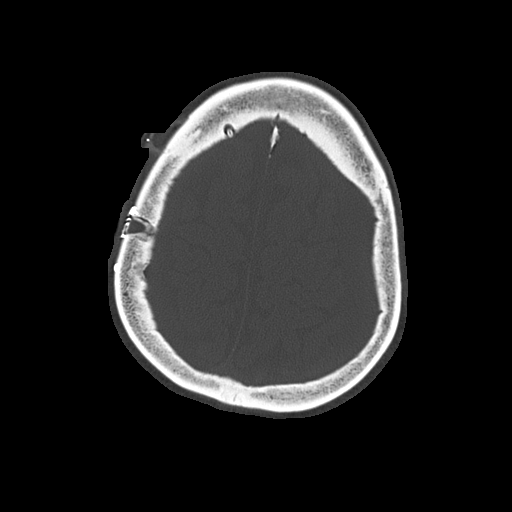
[im 71/89  bone]
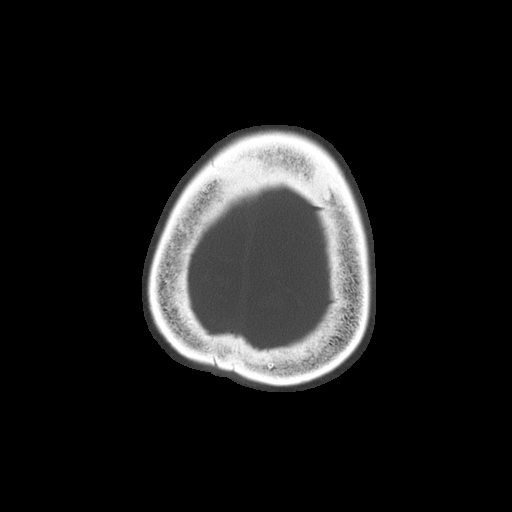
[im 80/89  bone]
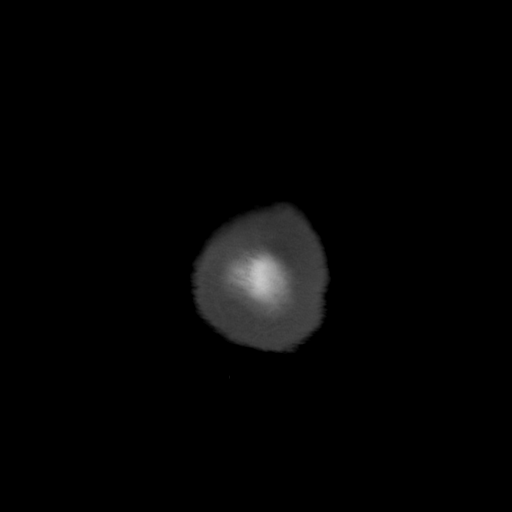

[15 of 30 positions shown; findings below may reference images not displayed]

FINDINGS: Brain: Bilateral chronic subdural hematomas measuring 11 mm on the
right and 7 mm on the left, unchanged. There is a right frontal
approach shunt catheter that terminates near the right foramen of
ENNIUQ. No hydrocephalus. There is gliosis surrounding the catheter
tract. There is periventricular hypoattenuation compatible with
chronic microvascular disease.

Vascular: No hyperdense vessel or unexpected calcification.

Skull: Right frontal burr hole. Remote left suboccipital craniotomy.

Sinuses/Orbits: No acute finding.

Other: None.
IMPRESSION: 1. Unchanged size of bilateral chronic subdural hematomas.
2. Unchanged position of right frontal approach shunt catheter. No
hydrocephalus.

## 2020-11-21 ENCOUNTER — Encounter: Payer: Self-pay | Admitting: Emergency Medicine

## 2020-11-21 ENCOUNTER — Emergency Department
Admission: EM | Admit: 2020-11-21 | Discharge: 2020-11-21 | Disposition: A | Discharge status: Home / Self Care | Payer: Medicaid Other | Attending: Emergency Medicine | Admitting: Emergency Medicine

## 2020-11-21 DIAGNOSIS — Z87891 Personal history of nicotine dependence: Secondary | ICD-10-CM | POA: Insufficient documentation

## 2020-11-21 DIAGNOSIS — I10 Essential (primary) hypertension: Secondary | ICD-10-CM | POA: Diagnosis not present

## 2020-11-21 DIAGNOSIS — Z79899 Other long term (current) drug therapy: Secondary | ICD-10-CM | POA: Insufficient documentation

## 2020-11-21 DIAGNOSIS — J9503 Malfunction of tracheostomy stoma: Secondary | ICD-10-CM | POA: Diagnosis present

## 2020-11-21 DIAGNOSIS — E890 Postprocedural hypothyroidism: Secondary | ICD-10-CM | POA: Diagnosis not present

## 2020-11-21 NOTE — Procedures (Signed)
Tracheostomy Change Note  Patient Details:   Name: Cree Ramchandani DOB: [birth year] MRN: [id number]    Airway Documentation: # 6 Shiley Flex Cuffless     Evaluation  O2 sats: stable throughout Complications: No apparent complications Patient did tolerate procedure well.   Patient presented from facility due to inability to pass suction catheter.  Patient in no respiratory distress and with PMV in place.  Trach confirmed partially occluded by ENT at bedside.  Replaced with # 6 Shiley Flex cuffless.  Procedure completed without complication.  ENT confirmed placement.  Patient able to cough and speak with PMV placement.  Suctioned by Heather RT.  Obturator and space cannulas to be sent with patient to facility.    Rayne Du Ronalda Walpole 11/21/2020, 4:43 PM

## 2020-11-21 NOTE — ED Notes (Signed)
Report called to [facility] spoke with jennifer all questions answered room 27A

## 2020-11-21 NOTE — ED Triage Notes (Signed)
Pt comes into the ED via EMS from [facility], states called out because they were not able to suction her trach, states there is resistance, a/o  130/78 98HR 98%RA [id number]

## 2020-11-21 NOTE — Consult Note (Signed)
Stacy Moran, [place] [id number] [birth year] Stacy Nearing, MD  Reason for Consult: Occluded trach tube Requesting Physician: Harvest Dark, MD Consulting Physician: Stacy Moran  HPI: This 65 y.o. year old female was admitted on 11/21/2020 for EMS- trachea Problem. 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  Patient had a tracheostomy placed in November 2021. They were unable to suction out the tracheostomy today so came to the emergency department for evaluation.  Here respiratory therapy is unable to pass a suction catheter through the tracheostomy due to secretions/mucus accumulation/hardening in the tracheostomy.  Patient does not appear to have an inner cannula within her tracheostomy.  Patient is in no distress.  Sat-ing in the upper 90s on room air.  Speech valve in place and is able to use it to communicate well. Allergies: No Known Allergies  Medications: (Not in a hospital admission) .  No current facility-administered medications for this encounter.   Current Outpatient Medications  Medication Sig Dispense Refill   acetaminophen (TYLENOL) 325 MG tablet Place 2 tablets (650 mg total) into feeding tube every 6 (six) hours as needed for mild pain.     amLODipine (NORVASC) 10 MG tablet PLACE 1 TABLET (10 MG TOTAL) INTO FEEDING TUBE DAILY. 90 tablet 1   chlorhexidine (PERIDEX) 0.12 % solution 15 mLs by Mouth Rinse route 2 (two) times daily. 120 mL 0   levETIRAcetam (KEPPRA) 100 MG/ML solution Place 7.5 mLs (750 mg total) into feeding tube 2 (two) times daily. 473 mL 12   levothyroxine (SYNTHROID) 100 MCG tablet Place 1 tablet (100 mcg total) into feeding tube daily at 6 (six) AM. 30 tablet 3   Nutritional Supplements (FEEDING SUPPLEMENT, OSMOLITE 1.2 CAL,) LIQD Place 355 mLs into feeding tube 4 (four) times daily -  with meals and at bedtime.  0   Nutritional Supplements  (FEEDING SUPPLEMENT, PROSOURCE TF,) liquid Place 45 mLs into feeding tube 2 (two) times daily.     polyethylene glycol (MIRALAX / GLYCOLAX) 17 g packet Place 17 g into feeding tube 2 (two) times daily. 14 each 0   QUEtiapine (SEROQUEL) 50 MG tablet PLACE 1 TABLET (50 MG TOTAL) INTO FEEDING TUBE 2 (TWO) TIMES DAILY. 60 tablet 3   senna (SENOKOT) 8.6 MG TABS tablet Place 1 tablet (8.6 mg total) into feeding tube daily. 120 tablet 0   Water For Irrigation, Sterile (FREE WATER) SOLN Place 200 mLs into feeding tube every 6 (six) hours.      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  Soc Hx:  Social History   Socioeconomic History   Marital status: Single    Spouse name: Not on file   Number of children: Not on file   Years of education: Not on file   Highest education level: Not on file  Occupational History   Not on file  Tobacco Use   Smoking status: Former   Smokeless tobacco: Never  Vaping Use   Vaping Use: Never used  Substance and Sexual Activity   Alcohol use: Not Currently   Drug use: Not Currently   Sexual activity: Not on file  Other Topics Concern  Not on file  Social History Narrative   Not on file   Social Determinants of Health   Financial Resource Strain: Not on file  Food Insecurity: Not on file  Transportation Needs: Not on file  Physical Activity: Not on file  Stress: Not on file  Social Connections: Not on file  Intimate Partner Violence: Not on file    PSH:  Past Surgical History:  Procedure Laterality Date   Georganna Skeans N/A 03/18/2020   Procedure: Haskell Flirt;  Surgeon: [name], MD;  Location: [place];  Service: Neurosurgery;  Laterality: N/A;   CRANIOTOMY Left 01/30/2020   Procedure: LEFT FAR  LATERAL CRANIOTOMY FOR ANEURYSM CLIPPING;  Surgeon: [name], MD;  Location: [place];  Service: Neurosurgery;  Laterality: Left;   DIRECT LARYNGOSCOPY N/A 02/29/2020   Procedure: DIRECT LARYNGOSCOPY;  Surgeon: [name], MD;  Location: [place];  Service: ENT;  Laterality: N/A;   DIRECT LARYNGOSCOPY N/A 10/03/2020   Procedure: DIRECT LARYNGOSCOPY w/esophagascopy;  Surgeon: [name], MD;  Location: [place];  Service: ENT;  Laterality: N/A;   IR ANGIO INTRA EXTRACRAN SEL INTERNAL CAROTID BILAT MOD SED  01/30/2020   IR ANGIO VERTEBRAL SEL VERTEBRAL UNI L MOD SED  01/30/2020   IR CM INJ ANY COLONIC TUBE W/FLUORO  07/03/2020   IR GASTROSTOMY TUBE MOD SED  02/22/2020   IR [place] GASTRO/COLONIC TUBE PERCUT W/FLUORO  07/03/2020   LAPAROSCOPIC REVISION VENTRICULAR-PERITONEAL (V-P) SHUNT N/A 03/18/2020   Procedure: LAPAROSCOPIC REVISION VENTRICULAR-PERITONEAL (V-P) SHUNT;  Surgeon: [name], MD;  Location: [place];  Service: General;  Laterality: N/A;   RADIOLOGY WITH ANESTHESIA N/A 01/30/2020   Procedure: IR WITH ANESTHESIA;  Surgeon: [name], MD;  Location: [place];  Service: Radiology;  Laterality: N/A;   THYROIDECTOMY N/A 02/08/2020   Procedure: THYROIDECTOMY;  Surgeon: [name], MD;  Location: [place];  Service: ENT;  Laterality: N/A;   TRACHEOSTOMY TUBE PLACEMENT N/A 02/08/2020   Procedure: TRACHEOSTOMY;  Surgeon: [name], MD;  Location: [facility];  Service: ENT;  Laterality: N/A;   TRACHEOSTOMY TUBE PLACEMENT N/A 02/29/2020   Procedure: TRACHEOSTOMY EXCHANGE;  Surgeon: [name], MD;  Location: [place];  Service: ENT;  Laterality: N/A;   VENTRICULOPERITONEAL SHUNT N/A 03/18/2020   Procedure: RIGHT SHUNT INSERTION VENTRICULAR-PERITONEAL/ BURR HOLE Evacuation of Subdural Hematoma;  Surgeon: [name], MD;  Location: [place];  Service: Neurosurgery;  Laterality: N/A;  . Procedures since admission: No admission procedures for hospital encounter.  ROS: Review of systems normal  other than 12 systems except per HPI.  PHYSICAL EXAM Vitals:  Vitals:   11/21/20 1453 11/21/20 1457  BP:  123/85  Pulse: 92   Resp: 20   Temp:  97.8 F (36.6 C)  SpO2: 96%   . General: Thin female in no acute distress Mood: Mood and affect well adjusted, pleasant and cooperative. Orientation: Grossly alert and oriented. Vocal Quality: Did not talk during evaluation. . head and Face: NCAT. No facial asymmetry. No visible skin lesions. No significant facial scars. No tenderness with sinus percussion. Facial strength normal and symmetric. Ears: External ears with normal landmarks, no lesions. External auditory canals free of infection, cerumen impaction or lesions. Tympanic membranes intact with good landmarks and normal mobility on pneumatic otoscopy. No middle ear effusion. Hearing: Speech reception grossly normal. Nose: External nose normal with midline dorsum and no lesions or deformity. Nasal Cavity reveals essentially midline septum with normal inferior turbinates. No significant mucosal congestion or erythema. Nasal secretions are minimal  and clear. No polyps seen on anterior rhinoscopy. Oral Cavity/ Oropharynx: Lips are normal with no lesions. Teeth no frank dental caries. Gingiva healthy with no lesions or gingivitis. Oropharynx including tongue, buccal mucosa, floor of mouth, hard and soft palate, uvula and posterior pharynx free of exudates, erythema or lesions with normal symmetry and hydration.  Indirect Laryngoscopy/Nasopharyngoscopy: Visualization of the larynx, hypopharynx and nasopharynx is not possible in this setting with routine examination. Neck: Supple and symmetric with no palpable masses, tenderness or crepitance. Tracheostomy tube in place with well formed stoma. No granulation tissue. Scarring below stoma from prior surgery.  Parotid and submandibular glands are soft, nontender and symmetric, without masses. Lymphatic: Cervical lymph nodes are without palpable  lymphadenopathy or tenderness. Respiratory: Normal respiratory effort without labored breathing. Cardiovascular: Carotid pulse shows regular rate and rhythm Neurologic: Cranial Nerves II through XII are grossly intact. Eyes: Gaze and Ocular Motility are grossly normal. PERRLA. No visible nystagmus.  MEDICAL DECISION MAKING: Data Review: No results found for this or any previous visit (from the past 48 hour(s)).Marland Kitchen No results found.Marland Kitchen   PROCEDURE: Procedure: Diagnostic Fiberoptic tracheoscopy Diagnosis: Obstructed tracheal airway Indications: Patient having trouble breathing through trach Findings:Crusting in the distal aspect of the tracheostomy tube. Once replaced with new tube the airway was inspected and free of obstruction, inflammation, or granulation tissue. No stenosis.  Description of Procedure: After discussing procedure and risks  (primarily nose bleed) with the patient, the trach tube was inspected with a flexible fiberoptic scope, noting the distal obstruction of the tube. The old tracheostomy tube was removed and replaced with a clean Shiley #6 uncuffed trach. The scope was then passed to reassess the airway. There was no obstruction beyond the tube (granulation, stenosis, etc). The scope was withdrawn. The patient tolerated the procedure well.  ASSESSMENT: Occluded tracheostomy tube  PLAN: Tube was replaced with a new tracheostomy tube. This can be avoided in the future by regular tracheostomy care with use of disposable inner cannulas to prevent crust accumulation.    Stacy Nearing, MD 11/21/2020 4:28 PM

## 2020-11-21 NOTE — ED Notes (Signed)
[name] [phone] contacted all updated pt to go back to [facility]

## 2020-11-21 NOTE — Discharge Instructions (Addendum)
Your tracheostomy has been replaced today in the emergency department.  Please return immediately for any trouble breathing, or bleeding, or any other symptom personally concerning to yourself.

## 2020-11-21 NOTE — ED Provider Notes (Signed)
[facility] Emergency Department Provider Note  Time seen: 3:55 PM  I have reviewed the triage vital signs and the nursing notes.   HISTORY  Chief Complaint Tracheostomy complication  HPI Stacy Moran is a 65 y.o. 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  Patient had a tracheostomy placed in November 2021, they have suction at home and a speech valve in place.  They were unable to suction out the tracheostomy today so they came to the emergency department for evaluation.  Here respiratory therapy is unable to pass a suction catheter through the tracheostomy due to secretions/mucus accumulation/hardening in the tracheostomy.  Patient does not appear to have an inner cannula within her tracheostomy.  Patient is in no distress.  Satting in the upper 90s on room air.  Speech valve in place and is able to use it to communicate well.   Past Medical History:  Diagnosis Date   Prosthetic eye globe    Ruptured aneurysm of artery (HCC)    Status post insertion of percutaneous endoscopic gastrostomy (PEG) tube (HCC)    Subdural hematoma (HCC)    Tracheostomy dependent [facility])     Patient Active Problem List   Diagnosis Date Noted   Fall (on) (from) other stairs and steps, initial encounter 08/26/2020   Subdural hematoma ([place]) 07/31/2020   History of hemorrhagic cerebrovascular accident (CVA) with residual deficit 06/06/2020   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   S/P ventriculoperitoneal shunt 06/06/2020   Functional urinary incontinence 06/06/2020   Ineffective airway clearance    Thrombocytopenia (HCC)    Restlessness    Restlessness and agitation    Acute blood loss anemia    Essential hypertension    Seizure prophylaxis    Protein-calorie malnutrition, severe 04/24/2020   Dysphagia, post-stroke    Benign essential HTN    Dysphagia    Status post  tracheostomy ([name])    S/P percutaneous endoscopic gastrostomy (PEG) tube placement (HCC)    ICH (intracerebral hemorrhage) ([name]) 04/22/2020   Obstructive hydrocephalus ([name])    Seizure-like activity ([name]) 03/12/2020   PEG (percutaneous endoscopic gastrostomy) status ([name]) 03/08/2020   Hyperglycemia 03/08/2020   Acute respiratory failure (HCC)    Ventilator dependence ([place])    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/19/2020   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/19/2020   Tracheostomy status ([place]) 02/19/2020   Abdominal distention    Ruptured aneurysm of artery (HCC)    SAH (subarachnoid hemorrhage) (HCC)    Subarachnoid bleed (HCC)    Tachypnea    Prediabetes    Hypokalemia    Leukocytosis    Ileus, postoperative ([name])    Pressure injury of skin 02/13/2020   Ruptured cerebral aneurysm ([name]) 01/30/2020    Past Surgical History:  Procedure Laterality Date   BURR HOLE N/A 03/18/2020   Procedure: Haskell Flirt;  Surgeon: [name], MD;  Location: [place];  Service: Neurosurgery;  Laterality: N/A;   CRANIOTOMY Left 01/30/2020   Procedure: LEFT FAR LATERAL CRANIOTOMY FOR ANEURYSM CLIPPING;  Surgeon: [name], MD;  Location: [place];  Service: Neurosurgery;  Laterality: Left;   DIRECT LARYNGOSCOPY N/A 02/29/2020   Procedure: DIRECT LARYNGOSCOPY;  Surgeon: [name], MD;  Location: [place];  Service: ENT;  Laterality: N/A;   DIRECT LARYNGOSCOPY N/A 10/03/2020   Procedure: DIRECT LARYNGOSCOPY w/esophagascopy;  Surgeon: [name], MD;  Location: [place];  Service: ENT;  Laterality: N/A;   IR ANGIO INTRA  EXTRACRAN SEL INTERNAL CAROTID BILAT MOD SED  01/30/2020   IR ANGIO VERTEBRAL SEL VERTEBRAL UNI L MOD SED  01/30/2020   IR CM INJ ANY COLONIC TUBE W/FLUORO  07/03/2020   IR GASTROSTOMY TUBE MOD SED  02/22/2020   IR REPLC GASTRO/COLONIC TUBE PERCUT W/FLUORO  07/03/2020   LAPAROSCOPIC REVISION VENTRICULAR-PERITONEAL (V-P) SHUNT N/A 03/18/2020   Procedure: LAPAROSCOPIC REVISION  VENTRICULAR-PERITONEAL (V-P) SHUNT;  Surgeon: [name], MD;  Location: [place];  Service: General;  Laterality: N/A;   RADIOLOGY WITH ANESTHESIA N/A 01/30/2020   Procedure: IR WITH ANESTHESIA;  Surgeon: [name], MD;  Location: [place];  Service: Radiology;  Laterality: N/A;   THYROIDECTOMY N/A 02/08/2020   Procedure: THYROIDECTOMY;  Surgeon: [name], MD;  Location: [place];  Service: ENT;  Laterality: N/A;   TRACHEOSTOMY TUBE PLACEMENT N/A 02/08/2020   Procedure: TRACHEOSTOMY;  Surgeon: [name], MD;  Location: [place];  Service: ENT;  Laterality: N/A;   TRACHEOSTOMY TUBE PLACEMENT N/A 02/29/2020   Procedure: TRACHEOSTOMY EXCHANGE;  Surgeon: [name], MD;  Location: [place];  Service: ENT;  Laterality: N/A;   VENTRICULOPERITONEAL SHUNT N/A 03/18/2020   Procedure: RIGHT SHUNT INSERTION VENTRICULAR-PERITONEAL/ BURR HOLE Evacuation of Subdural Hematoma;  Surgeon: [name], MD;  Location: [place];  Service: Neurosurgery;  Laterality: N/A;    Prior to Admission medications   Medication Sig Start Date End Date Taking? Authorizing Provider  acetaminophen (TYLENOL) 325 MG tablet Place 2 tablets (650 mg total) into feeding tube every 6 (six) hours as needed for mild pain. 05/20/20   Angiulli, [name], PA-C  amLODipine (NORVASC) 10 MG tablet PLACE 1 TABLET (10 MG TOTAL) INTO FEEDING TUBE DAILY. 08/10/20 08/10/21  [name], MD  chlorhexidine (PERIDEX) 0.12 % solution 15 mLs by Mouth Rinse route 2 (two) times daily. 10/07/20   [name], DO  levETIRAcetam (KEPPRA) 100 MG/ML solution Place 7.5 mLs (750 mg total) into feeding tube 2 (two) times daily. 10/07/20   [name], DO  levothyroxine (SYNTHROID) 100 MCG tablet Place 1 tablet (100 mcg total) into feeding tube daily at 6 (six) AM. 07/16/20   [name], MD  Nutritional Supplements (FEEDING SUPPLEMENT, OSMOLITE 1.2 CAL,) LIQD Place 355 mLs into feeding tube 4 (four) times daily -  with meals and at bedtime.  05/20/20   Angiulli, [name], PA-C  Nutritional Supplements (FEEDING SUPPLEMENT, PROSOURCE TF,) liquid Place 45 mLs into feeding tube 2 (two) times daily. 05/20/20   Angiulli, [name], PA-C  polyethylene glycol (MIRALAX / GLYCOLAX) 17 g packet Place 17 g into feeding tube 2 (two) times daily. 10/07/20   Welborn, Ryan, DO  QUEtiapine (SEROQUEL) 50 MG tablet PLACE 1 TABLET (50 MG TOTAL) INTO FEEDING TUBE 2 (TWO) TIMES DAILY. 07/29/20 07/29/21  Jamse Arn, MD  senna (SENOKOT) 8.6 MG TABS tablet Place 1 tablet (8.6 mg total) into feeding tube daily. 10/08/20   [name], DO  Water For Irrigation, Sterile (FREE WATER) SOLN Place 200 mLs into feeding tube every 6 (six) hours. 05/20/20   Angiulli, [name], PA-C    No Known Allergies  No family history on file.  Social History Social History   Tobacco Use   Smoking status: Former   Smokeless tobacco: Never  Scientific laboratory technician Use: Never used  Substance Use Topics   Alcohol use: Not Currently   Drug use: Not Currently    Review of Systems Constitutional: Negative for fever. Cardiovascular: Negative for chest pain.  Respiratory: Negative for shortness of breath. Gastrointestinal: Negative for abdominal pain Neurological: Negative for headache All other ROS negative  ____________________________________________   PHYSICAL EXAM:  VITAL SIGNS: ED Triage Vitals  Enc Vitals Group     BP 11/21/20 1457 123/85     Pulse Rate 11/21/20 1453 92     Resp 11/21/20 1453 20     Temp 11/21/20 1457 97.8 F (36.6 C)     Temp src --      SpO2 11/21/20 1453 96 %     Weight 11/21/20 1453 131 lb 9.8 oz (59.7 kg)     Height 11/21/20 1453 '5\' 6"'$  (1.676 m)     Head Circumference --      Peak Flow --      Pain Score 11/21/20 1453 0     Pain Loc --      Pain Edu? --      Excl. in [place]? --    Constitutional: Patient is awake alert, no distress, calm cooperative and pleasant. Eyes: Normal exam ENT      Head: Normocephalic and atraumatic.       Mouth/Throat: Mucous membranes are moist. Cardiovascular: Normal rate, regular rhythm. Respiratory: Normal respiratory effort without tachypnea nor retractions. Breath sounds are clear  Gastrointestinal: Soft and nontender. No distention.   Musculoskeletal: Nontender with normal range of motion in all extremities.  Neurologic:  Normal speech and language. No gross focal neurologic deficits  Skin:  Skin is warm, dry and intact.  Psychiatric: Mood and affect are normal.  ____________________________________________   INITIAL IMPRESSION / ASSESSMENT AND PLAN / ED COURSE  Pertinent labs & imaging results that were available during my care of the patient were reviewed by me and considered in my medical decision making (see chart for details).   Patient presents emergency department for clogged tracheostomy.  Unable to suction the tracheostomy due to mucus/secretion accumulation.  No inner cannula.  Respiratory unable to suction either.  Spoke to Dr. Richardson Landry of ENT who has come in and replace the patient's tracheostomy.  No complications with replaced tracheostomy.  We will monitor for short amount of time and plan to discharge home.  Patient agreeable to plan of care.  Shavanna Hemmingway was evaluated in Emergency Department on 11/21/2020 for the symptoms described in the history of present illness. She was evaluated in the context of the global COVID-19 pandemic, which necessitated consideration that the patient might be at risk for infection with the SARS-CoV-2 virus that causes COVID-19. Institutional protocols and algorithms that pertain to the evaluation of patients at risk for COVID-19 are in a state of rapid change based on information released by regulatory bodies including the CDC and federal and state organizations. These policies and algorithms were followed during the patient's care in the ED.  ____________________________________________   FINAL CLINICAL IMPRESSION(S) / ED  DIAGNOSES  Tracheostomy replacement   Harvest Dark, MD 11/21/20 1623

## 2020-11-21 NOTE — ED Notes (Signed)
Respiratory called to look at the pt trach and possibly suction the pt

## 2020-11-21 NOTE — ED Triage Notes (Signed)
Respiratory attempted to suction trach, unable to pass catheter.  NO inner cannula noted.  AAOx3.  Skin warm and dry. NAD

## 2020-11-21 NOTE — ED Notes (Signed)
[facility]  CALLED FOR  TRANSPORT  TO  [facility] 

## 2020-11-25 ENCOUNTER — Encounter: Payer: Self-pay | Admitting: Radiology

## 2020-11-25 ENCOUNTER — Emergency Department: Payer: Medicaid Other

## 2020-11-25 ENCOUNTER — Emergency Department
Admission: EM | Admit: 2020-11-25 | Discharge: 2020-11-25 | Disposition: A | Discharge status: Home / Self Care | Payer: Medicaid Other | Attending: Emergency Medicine | Admitting: Emergency Medicine

## 2020-11-25 DIAGNOSIS — Z79899 Other long term (current) drug therapy: Secondary | ICD-10-CM | POA: Diagnosis not present

## 2020-11-25 DIAGNOSIS — Z87891 Personal history of nicotine dependence: Secondary | ICD-10-CM | POA: Insufficient documentation

## 2020-11-25 DIAGNOSIS — J9503 Malfunction of tracheostomy stoma: Secondary | ICD-10-CM | POA: Diagnosis present

## 2020-11-25 DIAGNOSIS — I10 Essential (primary) hypertension: Secondary | ICD-10-CM | POA: Diagnosis not present

## 2020-11-25 DIAGNOSIS — J690 Pneumonitis due to inhalation of food and vomit: Secondary | ICD-10-CM

## 2020-11-25 IMAGING — DX DG CHEST 1V PORT
1 series · 1 of 1 positions shown · non-contrast
Comparison: [DATE]

CLINICAL DATA: Trach dislodged.  Difficulty breathing.

EXAM:
PORTABLE CHEST 1 VIEW

[chest ap]
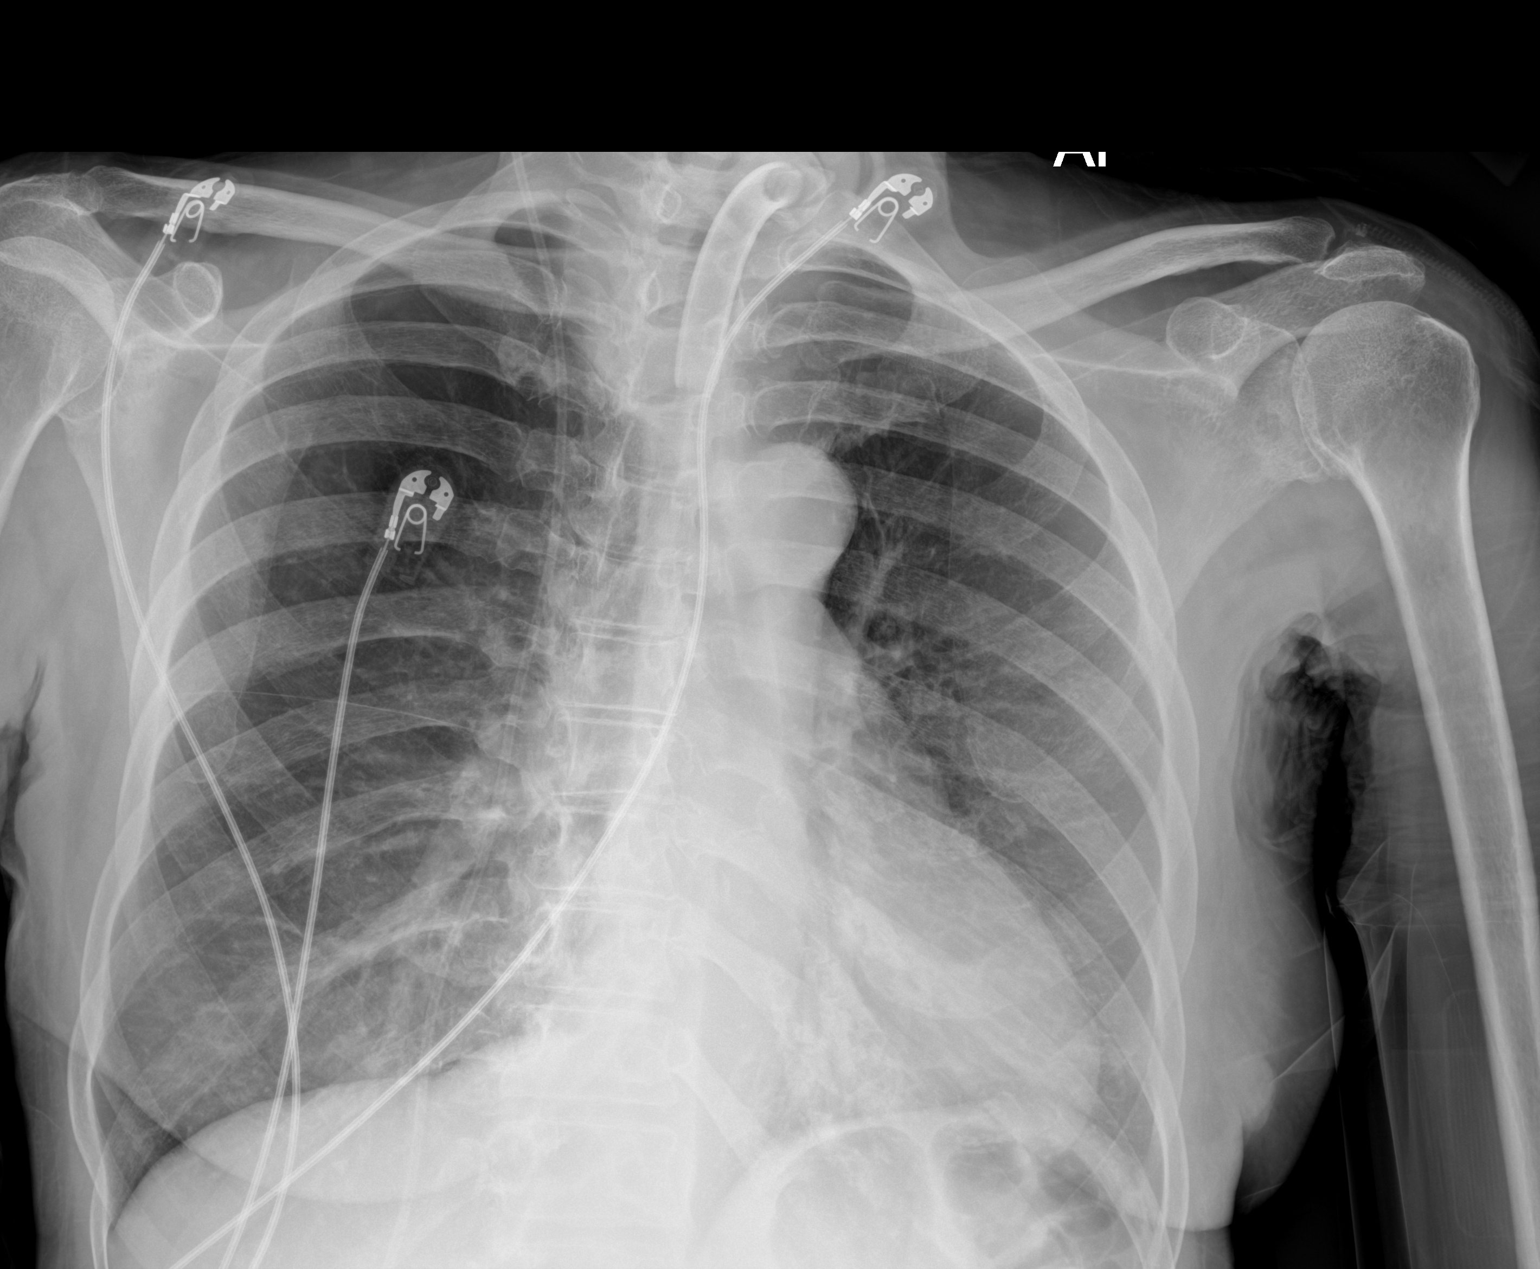

[1 of 1 positions shown; findings below may reference images not displayed]

FINDINGS: Leftward patient rotation. Tracheostomy tube appears in similar
position to prior study. VP shunt tubing over the right chest again
noted. Retrocardiac airspace disease noted left lung base. No edema
or pleural effusion. The cardiopericardial silhouette is within
normal limits for size. The visualized bony structures of the thorax
show no acute abnormality. Telemetry leads overlie the chest.
IMPRESSION: Retrocardiac airspace opacity left base, compatible with atelectasis
or infiltrate.

## 2020-11-25 MED ORDER — LORAZEPAM 2 MG/ML IJ SOLN
0.5000 mg | Freq: Once | INTRAMUSCULAR | Status: AC
  Administered 2020-11-25: 0.5 mg via INTRAVENOUS
  Filled 2020-11-25: qty 1

## 2020-11-25 MED ORDER — AMOXICILLIN-POT CLAVULANATE 600-42.9 MG/5ML PO SUSR: 600.0000 mg | Freq: Two times a day (BID) | ORAL | 0 refills | Status: AC

## 2020-11-25 MED ORDER — AMOXICILLIN-POT CLAVULANATE 400-57 MG/5ML PO SUSR
600.0000 mg | Freq: Once | ORAL | Status: AC
  Administered 2020-11-25: 600 mg
  Filled 2020-11-25: qty 7.5

## 2020-11-25 NOTE — ED Notes (Signed)
Pt continues to wait for transport back to facility.  Given a pillow for comfort and ensured the suction is within reach.

## 2020-11-25 NOTE — ED Provider Notes (Signed)
[facility] Emergency Department Provider Note   ____________________________________________   Event Date/Time   First MD Initiated Contact with Patient 11/25/20 [phone]     (approximate)  I have reviewed the triage vital signs and the nursing notes.   HISTORY  Chief Complaint Tracheostomy Tube Change    HPI Stacy Moran is a 65 y.o. female brought to the ED via EMS from home with a chief complaint of tracheostomy tube dislodgment.  Patient with a history of ruptured aneurysm/subarachnoid hemorrhage in November 2021 with tracheostomy placement.  Patient was in the ED just 4 days prior for clogged tracheostomy tube.  Tracheostomy was unable to be suctioned in the emergency department so ENT Dr. Richardson Landry came to evaluate her in the ED.  Trach tube was inspected with a flexible fiberoptic scope, noting distal obstruction.  Old tracheostomy tube was removed and replaced with a Shiley #6 uncuffed trach.  It was noted that patient did not have an inner cannula in place and it was recommended by ENT use of disposable inner cannulas may prevent crust accumulation.  Patient reports tracheostomy tube dislodged tonight due to coughing.  Feeling short of breath.  Denies fever, chest pain, abdominal pain, nausea, vomiting or diarrhea.     Past Medical History:  Diagnosis Date   Prosthetic eye globe    Ruptured aneurysm of artery (HCC)    Status post insertion of percutaneous endoscopic gastrostomy (PEG) tube (HCC)    Subdural hematoma (HCC)    Tracheostomy dependent [facility])     Patient Active Problem List   Diagnosis Date Noted   Fall (on) (from) other stairs and steps, initial encounter 08/26/2020   Subdural hematoma ([place]) 07/31/2020   History of hemorrhagic cerebrovascular accident (CVA) with residual deficit 06/06/2020   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   S/P ventriculoperitoneal shunt 06/06/2020   Functional urinary incontinence 06/06/2020   Ineffective  airway clearance    Thrombocytopenia (HCC)    Restlessness    Restlessness and agitation    Acute blood loss anemia    Essential hypertension    Seizure prophylaxis    Protein-calorie malnutrition, severe 04/24/2020   Dysphagia, post-stroke    Benign essential HTN    Dysphagia    Status post tracheostomy ([place])    S/P percutaneous endoscopic gastrostomy (PEG) tube placement (HCC)    ICH (intracerebral hemorrhage) ([place]) 04/22/2020   Obstructive hydrocephalus ([place])    Seizure-like activity ([place]) 03/12/2020   PEG (percutaneous endoscopic gastrostomy) status ([place]) 03/08/2020   Hyperglycemia 03/08/2020   Acute respiratory failure (HCC)    Ventilator dependence ([place])    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/19/2020   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/19/2020   Tracheostomy status ([place]) 02/19/2020   Abdominal distention    Ruptured aneurysm of artery (HCC)    SAH (subarachnoid hemorrhage) (HCC)    Subarachnoid bleed (HCC)    Tachypnea    Prediabetes    Hypokalemia    Leukocytosis    Ileus, postoperative ([place])    Pressure injury of skin 02/13/2020   Ruptured cerebral aneurysm ([place]) 01/30/2020    Past Surgical History:  Procedure Laterality Date   BURR HOLE N/A 03/18/2020   Procedure: Haskell Flirt;  Surgeon: [name], MD;  Location: [place];  Service: Neurosurgery;  Laterality: N/A;   CRANIOTOMY Left 01/30/2020   Procedure: LEFT FAR LATERAL CRANIOTOMY FOR ANEURYSM CLIPPING;  Surgeon: [name], MD;  Location: [place];  Service: Neurosurgery;  Laterality: Left;   DIRECT LARYNGOSCOPY N/A 02/29/2020  Procedure: DIRECT LARYNGOSCOPY;  Surgeon: [name], MD;  Location: [place];  Service: ENT;  Laterality: N/A;   DIRECT LARYNGOSCOPY N/A 10/03/2020   Procedure: DIRECT LARYNGOSCOPY w/esophagascopy;  Surgeon: [name], MD;  Location: [place];  Service: ENT;  Laterality: N/A;   IR ANGIO INTRA EXTRACRAN SEL INTERNAL CAROTID BILAT MOD SED  01/30/2020   IR ANGIO VERTEBRAL SEL  VERTEBRAL UNI L MOD SED  01/30/2020   IR CM INJ ANY COLONIC TUBE W/FLUORO  07/03/2020   IR GASTROSTOMY TUBE MOD SED  02/22/2020   IR [place] GASTRO/COLONIC TUBE PERCUT W/FLUORO  07/03/2020   LAPAROSCOPIC REVISION VENTRICULAR-PERITONEAL (V-P) SHUNT N/A 03/18/2020   Procedure: LAPAROSCOPIC REVISION VENTRICULAR-PERITONEAL (V-P) SHUNT;  Surgeon: [name], MD;  Location: [place];  Service: General;  Laterality: N/A;   RADIOLOGY WITH ANESTHESIA N/A 01/30/2020   Procedure: IR WITH ANESTHESIA;  Surgeon: [name], MD;  Location: [place];  Service: Radiology;  Laterality: N/A;   THYROIDECTOMY N/A 02/08/2020   Procedure: THYROIDECTOMY;  Surgeon: [name], MD;  Location: [place];  Service: ENT;  Laterality: N/A;   TRACHEOSTOMY TUBE PLACEMENT N/A 02/08/2020   Procedure: TRACHEOSTOMY;  Surgeon: [name], MD;  Location: [place];  Service: ENT;  Laterality: N/A;   TRACHEOSTOMY TUBE PLACEMENT N/A 02/29/2020   Procedure: TRACHEOSTOMY EXCHANGE;  Surgeon: [name], MD;  Location: [place];  Service: ENT;  Laterality: N/A;   VENTRICULOPERITONEAL SHUNT N/A 03/18/2020   Procedure: RIGHT SHUNT INSERTION VENTRICULAR-PERITONEAL/ BURR HOLE Evacuation of Subdural Hematoma;  Surgeon: [name], MD;  Location: [place];  Service: Neurosurgery;  Laterality: N/A;    Prior to Admission medications   Medication Sig Start Date End Date Taking? Authorizing Provider  amoxicillin-clavulanate (AUGMENTIN ES-600) 600-42.9 MG/5ML suspension Place 5 mLs (600 mg total) into feeding tube every 12 (twelve) hours for 7 days. 11/25/20 12/02/20 Yes Paulette Blanch, MD  acetaminophen (TYLENOL) 325 MG tablet Place 2 tablets (650 mg total) into feeding tube every 6 (six) hours as needed for mild pain. 05/20/20   Angiulli, [name], PA-C  amLODipine (NORVASC) 10 MG tablet PLACE 1 TABLET (10 MG TOTAL) INTO FEEDING TUBE DAILY. 08/10/20 08/10/21  [name], MD  chlorhexidine (PERIDEX) 0.12 % solution 15 mLs by Mouth Rinse route 2  (two) times daily. 10/07/20   [name], DO  levETIRAcetam (KEPPRA) 100 MG/ML solution Place 7.5 mLs (750 mg total) into feeding tube 2 (two) times daily. 10/07/20   [name], DO  levothyroxine (SYNTHROID) 100 MCG tablet Place 1 tablet (100 mcg total) into feeding tube daily at 6 (six) AM. 07/16/20   [name], MD  Nutritional Supplements (FEEDING SUPPLEMENT, OSMOLITE 1.2 CAL,) LIQD Place 355 mLs into feeding tube 4 (four) times daily -  with meals and at bedtime. 05/20/20   Angiulli, [name], PA-C  Nutritional Supplements (FEEDING SUPPLEMENT, PROSOURCE TF,) liquid Place 45 mLs into feeding tube 2 (two) times daily. 05/20/20   Angiulli, [name], PA-C  polyethylene glycol (MIRALAX / GLYCOLAX) 17 g packet Place 17 g into feeding tube 2 (two) times daily. 10/07/20   Welborn, Ryan, DO  QUEtiapine (SEROQUEL) 50 MG tablet PLACE 1 TABLET (50 MG TOTAL) INTO FEEDING TUBE 2 (TWO) TIMES DAILY. 07/29/20 07/29/21  Jamse Arn, MD  senna (SENOKOT) 8.6 MG TABS tablet Place 1 tablet (8.6 mg total) into feeding tube daily. 10/08/20   [name], DO  Water For Irrigation, Sterile (FREE WATER) SOLN Place 200 mLs into feeding tube every 6 (  six) hours. 05/20/20   Angiulli, [name], PA-C    Allergies Patient has no known allergies.  No family history on file.  Social History Social History   Tobacco Use   Smoking status: Former   Smokeless tobacco: Never  Scientific laboratory technician Use: Never used  Substance Use Topics   Alcohol use: Not Currently   Drug use: Not Currently    Review of Systems  Constitutional: No fever/chills Eyes: No visual changes. ENT: No sore throat. Cardiovascular: Denies chest pain. Respiratory: Positive for tracheostomy tube dislodgment and shortness of breath. Gastrointestinal: No abdominal pain.  No nausea, no vomiting.  No diarrhea.  No constipation. Genitourinary: Negative for dysuria. Musculoskeletal: Negative for back pain. Skin: Negative for  rash. Neurological: Negative for headaches, focal weakness or numbness.   ____________________________________________   PHYSICAL EXAM:  VITAL SIGNS: ED Triage Vitals  Enc Vitals Group     BP      Pulse      Resp      Temp      Temp src      SpO2      Weight      Height      Head Circumference      Peak Flow      Pain Score      Pain Loc      Pain Edu?      Excl. in [place]?     Constitutional: Alert and oriented.  Cachectic, chronically ill appearing and in mild acute distress. Eyes: Conjunctivae are normal. PERRL. EOMI. Head: Atraumatic. Nose: No congestion/rhinnorhea. Mouth/Throat: Mucous membranes are mildly dry.   Neck: No stridor.  Tracheostomy stoma noted with secretions. Cardiovascular: Tachycardic rate, regular rhythm. Grossly normal heart sounds.  Good peripheral circulation. Respiratory: Increased respiratory effort.  No retractions. Lungs with scattered rhonchi. Gastrointestinal: Soft and nontender. No distention. No abdominal bruits. No CVA tenderness. Musculoskeletal: No lower extremity tenderness nor edema.  No joint effusions. Neurologic:  Normal speech and language. No gross focal neurologic deficits are appreciated. No gait instability. Skin:  Skin is warm, dry and intact. No rash noted. Psychiatric: Mood and affect are normal. Speech and behavior are normal.  ____________________________________________   LABS (all labs ordered are listed, but only abnormal results are displayed)  Labs Reviewed - No data to display ____________________________________________  EKG  None ____________________________________________  RADIOLOGY I, Kassidee Narciso J, personally viewed and evaluated these images (plain radiographs) as part of my medical decision making, as well as reviewing the written report by the radiologist.  ED MD interpretation: Atelectasis versus retrocardiac opacity  Official radiology report(s): DG Chest Port 1 View  Result Date:  11/25/2020 CLINICAL DATA:  Trach dislodged.  Difficulty breathing. EXAM: PORTABLE CHEST 1 VIEW COMPARISON:  08/26/2020 FINDINGS: Leftward patient rotation. Tracheostomy tube appears in similar position to prior study. VP shunt tubing over the right chest again noted. Retrocardiac airspace disease noted left lung base. No edema or pleural effusion. The cardiopericardial silhouette is within normal limits for size. The visualized bony structures of the thorax show no acute abnormality. Telemetry leads overlie the chest. IMPRESSION: Retrocardiac airspace opacity left base, compatible with atelectasis or infiltrate. Electronically Signed   By: Misty Stanley M.D.   On: 11/25/2020 05:47    ____________________________________________   PROCEDURES  Procedure(s) performed (including Critical Care):  Procedures   ____________________________________________   INITIAL IMPRESSION / ASSESSMENT AND PLAN / ED COURSE  As part of my medical decision making, I reviewed the following data  within the [place] notes reviewed and incorporated, Old chart reviewed, Radiograph reviewed, and Notes from prior ED visits     65 year old female presenting with dislodged tracheostomy tube.  Will ask RT to evaluate and replace.  Will obtain chest x-ray.  Patient highly anxious; will administer low-dose IV Ativan for calming and also to help facilitate tracheostomy replacement.  Clinical Course as of 11/25/20 [id number]  Tue Nov 25, 2020  0451 Respiratory therapy was able to place a #6 Shiley cuffless tube.  Patient educated on keeping the tracheostomy band tight around her neck to prevent dislodgment of the tube when coughing.  Will obtain chest x-ray. [JS]  0553 Atelectasis versus retrocardiac infiltrate on chest x-ray.  It is likely that patient may have an element of aspiration as she has had difficulty with tracheostomy tubes x2 this week.  Will cover empirically with Augmentin.  She is feeling  significantly better.  Strict return precautions given.  Patient verbalizes understanding and agrees with plan of care. [JS]    Clinical Course User Index [JS] Paulette Blanch, MD     ____________________________________________   FINAL CLINICAL IMPRESSION(S) / ED DIAGNOSES  Final diagnoses:  Tracheostomy malfunction ([place])  Aspiration pneumonia of left lung, unspecified aspiration pneumonia type, unspecified part of lung [facility])     ED Discharge Orders          Ordered    amoxicillin-clavulanate (AUGMENTIN ES-600) 600-42.9 MG/5ML suspension  Every 12 hours        11/25/20 0607             Note:  This document was prepared using Dragon voice recognition software and may include unintentional dictation errors.    Paulette Blanch, MD 11/25/20 [phone]

## 2020-11-25 NOTE — ED Notes (Signed)
Pt given warm blankets and updated on delay in transfer.  Writer ensured Pt has access to suction.  Per night RN, Pt is able to suction herself.

## 2020-11-25 NOTE — Discharge Instructions (Addendum)
1.  Administer antibiotic (Augmentin '600mg'$  susp) twice daily via G-tube until finished. 2.  Return to the ER for recurrent or worsening symptoms, persistent vomiting, difficulty breathing or other concerns

## 2020-11-25 NOTE — ED Notes (Signed)
3rd attempt to call [facility] [phone]); NO Answer; RN Elana notified

## 2020-11-25 NOTE — ED Notes (Signed)
EMS called for pt to be transported back to [facility]

## 2020-11-25 NOTE — ED Notes (Signed)
Pt transported to facility by EMS.  Report given to facility and EMS.  NAD noted.  All paperwork given to EMS.

## 2020-11-25 NOTE — ED Notes (Signed)
1st attempt to give report to [facility]; NO Answer

## 2020-11-25 NOTE — ED Triage Notes (Signed)
EMS reports trach dislodged; patient reports difficulty breathing; A/O x 3; skin warm/ dry; respirations labored/ cough with purulent sputum and blood from stoma

## 2020-11-25 NOTE — ED Notes (Signed)
4th attempt made to call report to [facility] [phone]).  No answer.

## 2020-11-25 NOTE — Progress Notes (Signed)
PT received via EMS with missing trach, pt on respiratory distress, Replaced with # 6 flex cuffiess trach without complication, sat [id number] on Room air, pt resting

## 2020-11-25 NOTE — ED Notes (Signed)
2nd attempt to call report to [facility] for transfer back to SNF; NO Answer at this time

## 2020-12-05 ENCOUNTER — Emergency Department
Admission: EM | Admit: 2020-12-05 | Discharge: 2020-12-05 | Disposition: A | Discharge status: Skilled Nursing Facility | Payer: Medicaid Other | Attending: Student in an Organized Health Care Education/Training Program | Admitting: Student in an Organized Health Care Education/Training Program

## 2020-12-05 ENCOUNTER — Emergency Department: Payer: Medicaid Other

## 2020-12-05 DIAGNOSIS — E039 Hypothyroidism, unspecified: Secondary | ICD-10-CM | POA: Diagnosis not present

## 2020-12-05 DIAGNOSIS — Z87891 Personal history of nicotine dependence: Secondary | ICD-10-CM | POA: Diagnosis not present

## 2020-12-05 DIAGNOSIS — Z79899 Other long term (current) drug therapy: Secondary | ICD-10-CM | POA: Insufficient documentation

## 2020-12-05 DIAGNOSIS — I10 Essential (primary) hypertension: Secondary | ICD-10-CM | POA: Diagnosis not present

## 2020-12-05 DIAGNOSIS — J9500 Unspecified tracheostomy complication: Secondary | ICD-10-CM | POA: Diagnosis present

## 2020-12-05 DIAGNOSIS — R0602 Shortness of breath: Secondary | ICD-10-CM | POA: Diagnosis not present

## 2020-12-05 HISTORY — DX: Unspecified convulsions: R56.9

## 2020-12-05 HISTORY — DX: Essential (primary) hypertension: I10

## 2020-12-05 HISTORY — DX: Tracheostomy status: Z93.0

## 2020-12-05 HISTORY — DX: Acute respiratory failure, unspecified whether with hypoxia or hypercapnia: J96.00

## 2020-12-05 LAB — CBC
HCT: 40.5 % (ref 36.0–46.0)
Hemoglobin: 13.1 g/dL (ref 12.0–15.0)
MCH: 26.6 pg (ref 26.0–34.0)
MCHC: 32.3 g/dL (ref 30.0–36.0)
MCV: 82.2 fL (ref 80.0–100.0)
Platelets: 171 10*3/uL (ref 150–400)
RBC: 4.93 MIL/uL (ref 3.87–5.11)
RDW: 14 % (ref 11.5–15.5)
WBC: 12.9 10*3/uL — ABNORMAL HIGH (ref 4.0–10.5)
nRBC: 0 % (ref 0.0–0.2)

## 2020-12-05 LAB — COMPREHENSIVE METABOLIC PANEL
ALT: 34 U/L (ref 0–44)
AST: 43 U/L — ABNORMAL HIGH (ref 15–41)
Albumin: 4.5 g/dL (ref 3.5–5.0)
Alkaline Phosphatase: 65 U/L (ref 38–126)
Anion gap: 11 (ref 5–15)
BUN: 28 mg/dL — ABNORMAL HIGH (ref 8–23)
CO2: 28 mmol/L (ref 22–32)
Calcium: 9.6 mg/dL (ref 8.9–10.3)
Chloride: 100 mmol/L (ref 98–111)
Creatinine, Ser: 0.68 mg/dL (ref 0.44–1.00)
GFR, Estimated: >60 mL/min (ref >60)
Glucose, Bld: 317 mg/dL — ABNORMAL HIGH (ref 70–99)
Potassium: 3.7 mmol/L (ref 3.5–5.1)
Sodium: 139 mmol/L (ref 135–145)
Total Bilirubin: 0.3 mg/dL (ref 0.3–1.2)
Total Protein: 8.5 g/dL — ABNORMAL HIGH (ref 6.5–8.1)

## 2020-12-05 IMAGING — DX DG CHEST 1V PORT
1 series · 1 of 1 positions shown · non-contrast
Comparison: [DATE]

CLINICAL DATA: Tracheostomy dislodged

EXAM:
PORTABLE CHEST 1 VIEW

[chest ap]
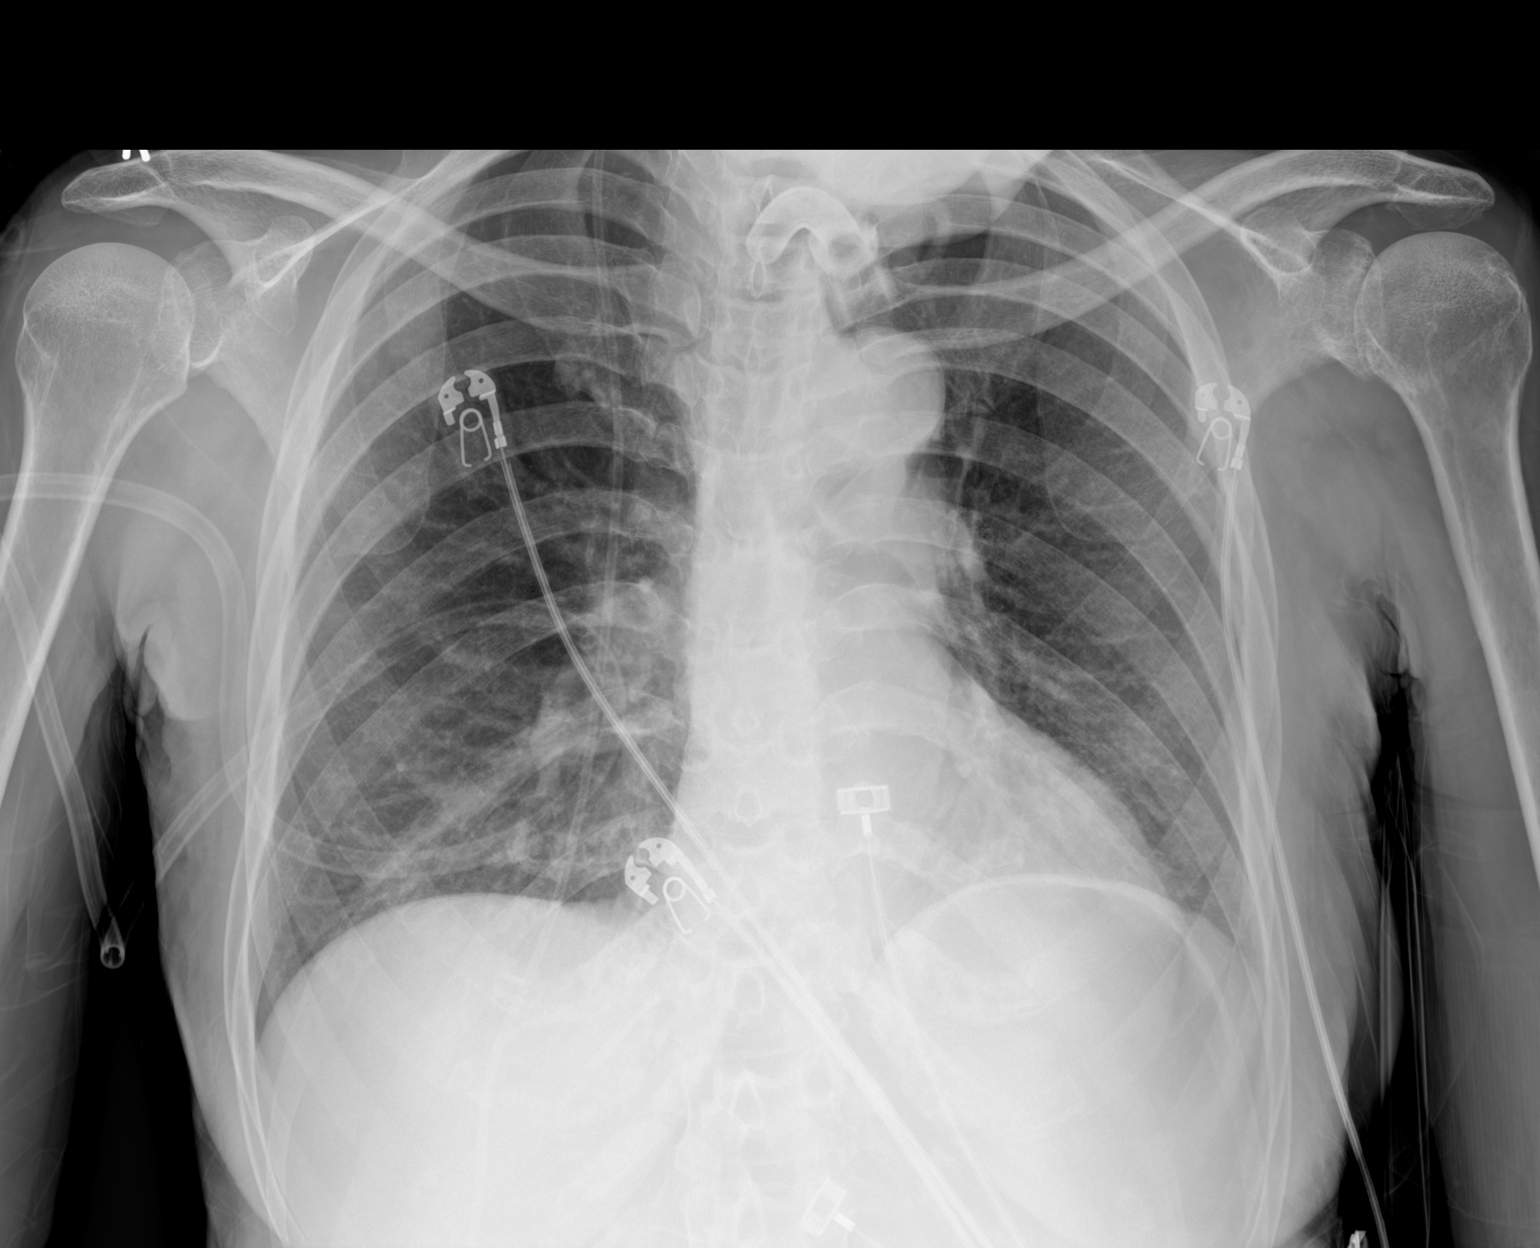

[1 of 1 positions shown; findings below may reference images not displayed]

FINDINGS: The heart size and mediastinal contours are within normal limits.
Tracheostomy appliance is positioned with tip in the upper trachea.
Both lungs are clear. The visualized skeletal structures are
unremarkable.
IMPRESSION: No acute abnormality of the lungs in AP portable projection.
Tracheostomy appliance is positioned with tip in the upper trachea.

## 2020-12-05 MED ORDER — PROBIOTIC BIOGAIA/SOOTHE NICU ORAL SYRINGE: 5.0000 [drp] | Freq: Every day | ORAL | 0 refills | Status: DC

## 2020-12-05 MED ORDER — AMOXICILLIN-POT CLAVULANATE 250-62.5 MG/5ML PO SUSR: 875.0000 mg | Freq: Two times a day (BID) | ORAL | 0 refills | Status: AC

## 2020-12-05 MED ORDER — AMOXICILLIN-POT CLAVULANATE 400-57 MG/5ML PO SUSR
875.0000 mg | Freq: Once | ORAL | Status: AC
  Administered 2020-12-05: 875 mg
  Filled 2020-12-05: qty 17.5

## 2020-12-05 NOTE — ED Notes (Signed)
[facility]  CALLED FOR  TRANSPORT  TO  [facility] 

## 2020-12-05 NOTE — ED Notes (Signed)
Pt NAD, a/ox4. Pt verbalizes understanding of all DC and f/u instructions. Instructions also called and given to Scientist, clinical (histocompatibility and immunogenetics) at [facility]. EMS to transport pt home.

## 2020-12-05 NOTE — ED Provider Notes (Signed)
[facility] Emergency Department Provider Note    Event Date/Time   First MD Initiated Contact with Patient 12/05/20 1155     (approximate)  I have reviewed the triage vital signs and the nursing notes.   HISTORY  Chief Complaint Shortness of Breath    HPI Stacy Moran is a 65 y.o. female with extensive past medical history listed below [facility] with [facility]ostomy issue.  Reportedly trach came out this morning.  She is not vent dependent.  He has tried to replace trach at the facility when able to so she was brought to the ER emergency traffic.  She arrives with mild tachypnea and tachycardia no hemoptysis.  She is satting 100%.  Past Medical History:  Diagnosis Date   Acute respiratory failure ([place])    Hypertension    Prosthetic eye globe    Ruptured aneurysm of artery (HCC)    Seizures ([place])    Status post insertion of percutaneous endoscopic gastrostomy (PEG) tube ([place])    Subdural hematoma ([place])    Tracheostomy dependent ([place])    Tracheostomy present ([place])    History reviewed. No pertinent family history. Past Surgical History:  Procedure Laterality Date   BURR HOLE N/A 03/18/2020   Procedure: Haskell Flirt;  Surgeon: [name], MD;  Location: [facility];  Service: Neurosurgery;  Laterality: N/A;   CRANIOTOMY Left 01/30/2020   Procedure: LEFT FAR LATERAL CRANIOTOMY FOR ANEURYSM CLIPPING;  Surgeon: [name], MD;  Location: [facility];  Service: Neurosurgery;  Laterality: Left;   DIRECT LARYNGOSCOPY N/A 02/29/2020   Procedure: DIRECT LARYNGOSCOPY;  Surgeon: [name], MD;  Location: [facility];  Service: ENT;  Laterality: N/A;   DIRECT LARYNGOSCOPY N/A 10/03/2020   Procedure: DIRECT LARYNGOSCOPY w/esophagascopy;  Surgeon: [name], MD;  Location: [facility];  Service: ENT;  Laterality: N/A;   IR ANGIO INTRA EXTRACRAN SEL INTERNAL CAROTID BILAT MOD SED  01/30/2020   IR ANGIO VERTEBRAL SEL VERTEBRAL UNI L MOD SED   01/30/2020   IR CM INJ ANY COLONIC TUBE W/FLUORO  07/03/2020   IR GASTROSTOMY TUBE MOD SED  02/22/2020   IR [place] GASTRO/COLONIC TUBE PERCUT W/FLUORO  07/03/2020   LAPAROSCOPIC REVISION VENTRICULAR-PERITONEAL (V-P) SHUNT N/A 03/18/2020   Procedure: LAPAROSCOPIC REVISION VENTRICULAR-PERITONEAL (V-P) SHUNT;  Surgeon: [name], MD;  Location: [place];  Service: General;  Laterality: N/A;   RADIOLOGY WITH ANESTHESIA N/A 01/30/2020   Procedure: IR WITH ANESTHESIA;  Surgeon: [name], MD;  Location: [place];  Service: Radiology;  Laterality: N/A;   THYROIDECTOMY N/A 02/08/2020   Procedure: THYROIDECTOMY;  Surgeon: [name], MD;  Location: [place];  Service: ENT;  Laterality: N/A;   TRACHEOSTOMY TUBE PLACEMENT N/A 02/08/2020   Procedure: TRACHEOSTOMY;  Surgeon: [name], MD;  Location: [place];  Service: ENT;  Laterality: N/A;   TRACHEOSTOMY TUBE PLACEMENT N/A 02/29/2020   Procedure: TRACHEOSTOMY EXCHANGE;  Surgeon: [name], MD;  Location: [place];  Service: ENT;  Laterality: N/A;   VENTRICULOPERITONEAL SHUNT N/A 03/18/2020   Procedure: RIGHT SHUNT INSERTION VENTRICULAR-PERITONEAL/ BURR HOLE Evacuation of Subdural Hematoma;  Surgeon: [name], MD;  Location: [place];  Service: Neurosurgery;  Laterality: N/A;   Patient Active Problem List   Diagnosis Date Noted   Fall (on) (from) other stairs and steps, initial encounter 08/26/2020   Subdural hematoma ([place]) 07/31/2020   History of hemorrhagic cerebrovascular accident (CVA) with residual deficit 06/06/2020   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   S/P ventriculoperitoneal shunt  06/06/2020   Functional urinary incontinence 06/06/2020   Ineffective airway clearance    Thrombocytopenia (HCC)    Restlessness    Restlessness and agitation    Acute blood loss anemia    Essential hypertension    Seizure prophylaxis    Protein-calorie malnutrition, severe 04/24/2020   Dysphagia, post-stroke    Benign essential HTN     Dysphagia    Status post tracheostomy ([place])    S/P percutaneous endoscopic gastrostomy (PEG) tube placement (HCC)    ICH (intracerebral hemorrhage) ([place]) 04/22/2020   Obstructive hydrocephalus ([place])    Seizure-like activity ([place]) 03/12/2020   PEG (percutaneous endoscopic gastrostomy) status ([place]) 03/08/2020   Hyperglycemia 03/08/2020   Acute respiratory failure (HCC)    Ventilator dependence ([place])    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 Authorizing Provider  acetaminophen (TYLENOL) 325 MG tablet Place 2 tablets (650 mg total) into feeding tube every 6 (six) hours as needed for mild pain. 05/20/20  Yes Angiulli, [name], PA-C  amLODipine (NORVASC) 10 MG tablet PLACE 1 TABLET (10 MG TOTAL) INTO FEEDING TUBE DAILY. 08/10/20 08/10/21 Yes [name], MD  chlorhexidine (PERIDEX) 0.12 % solution 15 mLs by Mouth Rinse route 2 (two) times daily. 10/07/20  Yes Welborn, Ryan, DO  levETIRAcetam (KEPPRA) 100 MG/ML solution Place 7.5 mLs (750 mg total) into feeding tube 2 (two) times daily. 10/07/20  Yes [name], DO  levothyroxine (SYNTHROID) 100 MCG tablet Place 1 tablet (100 mcg total) into feeding tube daily at 6 (six) AM. 07/16/20  Yes [name], MD  Nutritional Supplements (FEEDING SUPPLEMENT, OSMOLITE 1.2 CAL,) LIQD Place 355 mLs into feeding tube 4 (four) times daily -  with meals and at bedtime. 05/20/20  Yes Angiulli, [name], PA-C  Nutritional Supplements (FEEDING SUPPLEMENT, PROSOURCE TF,) liquid  Place 45 mLs into feeding tube 2 (two) times daily. 05/20/20  Yes Angiulli, [name], PA-C  polyethylene glycol (MIRALAX / GLYCOLAX) 17 g packet Place 17 g into feeding tube 2 (two) times daily. 10/07/20  Yes Welborn, Ryan, DO  QUEtiapine (SEROQUEL) 50 MG tablet PLACE 1 TABLET (50 MG TOTAL) INTO FEEDING TUBE 2 (TWO) TIMES DAILY. 07/29/20 07/29/21 Yes Jamse Arn, MD  senna (SENOKOT) 8.6 MG TABS tablet Place 1 tablet (8.6 mg total) into feeding tube daily. 10/08/20  Yes [name], DO  Water For Irrigation, Sterile (FREE WATER) SOLN Place 200 mLs into feeding tube every 6 (six) hours. 05/20/20  Yes Angiulli, [name], PA-C    Allergies Patient has no known allergies.    Social History Social History   Tobacco Use   Smoking status: Former   Smokeless tobacco: Never  Scientific laboratory technician Use: Never used  Substance Use Topics   Alcohol use: Not Currently   Drug use: Not Currently    Review of Systems Patient denies headaches, rhinorrhea, blurry vision, numbness, shortness of breath, chest pain, edema, cough, abdominal pain, nausea, vomiting, diarrhea,  dysuria, fevers, rashes or hallucinations unless otherwise stated above in HPI. ____________________________________________   PHYSICAL EXAM:  VITAL SIGNS: Vitals:   12/05/20 1330 12/05/20 1400  BP: 117/68 107/60  Pulse: 89 87  Resp: 14 15  Temp:    SpO2: 100% 100%    Constitutional: Alert and oriented.  Eyes: Conjunctivae are normal.  Head: Atraumatic. Nose: No congestion/rhinnorhea. Mouth/Throat: Mucous membranes are moist.   Neck: tracheostomy appears c/d/I, there is some inspiratory stridor. Cardiovascular: Normal rate, regular rhythm. Grossly normal heart sounds.  Good peripheral circulation. Respiratory: Normal respiratory effort.  No retractions. Lungs CTAB. Gastrointestinal: Soft and nontender. No distention. No abdominal bruits. No CVA tenderness. Genitourinary:  Musculoskeletal: No lower extremity tenderness nor  edema.  No joint effusions. Neurologic:  Normal speech and language. No gross focal neurologic deficits are appreciated. No facial droop Skin:  Skin is warm, dry and intact. No rash noted. Psychiatric: Mood and affect are normal. Speech and behavior are normal.  ____________________________________________   LABS (all labs ordered are listed, but only abnormal results are displayed)  Results for orders placed or performed during the hospital encounter of 12/05/20 (from the past 24 hour(s))  CBC     Status: Abnormal   Collection Time: 12/05/20  1:35 PM  Result Value Ref Range   WBC 12.9 (H) 4.0 - 10.5 K/uL   RBC 4.93 3.87 - 5.11 MIL/uL   Hemoglobin 13.1 12.0 - 15.0 g/dL   HCT 40.5 36.0 - 46.0 %   MCV 82.2 80.0 - 100.0 fL   MCH 26.6 26.0 - 34.0 pg   MCHC 32.3 30.0 - 36.0 g/dL   RDW 14.0 11.5 - 15.5 %   Platelets 171 150 - 400 K/uL   nRBC 0.0 0.0 - 0.2 %  Comprehensive metabolic panel     Status: Abnormal   Collection Time: 12/05/20  1:35 PM  Result Value Ref Range   Sodium 139 135 - 145 mmol/L   Potassium 3.7 3.5 - 5.1 mmol/L   Chloride 100 98 - 111 mmol/L   CO2 28 22 - 32 mmol/L   Glucose, Bld 317 (H) 70 - 99 mg/dL   BUN 28 (H) 8 - 23 mg/dL   Creatinine, Ser 0.68 0.44 - 1.00 mg/dL   Calcium 9.6 8.9 - 10.3 mg/dL   Total Protein 8.5 (H) 6.5 - 8.1 g/dL   Albumin 4.5 3.5 - 5.0 g/dL   AST 43 (H) 15 - 41 U/L   ALT 34 0 - 44 U/L   Alkaline Phosphatase 65 38 - 126 U/L   Total Bilirubin 0.3 0.3 - 1.2 mg/dL   GFR, Estimated >60 >60 mL/min   Anion gap 11 5 - 15   ____________________________________________  EKG My review and personal interpretation at Time: 11:50   Indication: tachycardic Rate: 125  Rhythm: sinus Axis: normal Other: normal intervals, no stemi ____________________________________________  RADIOLOGY  I personally reviewed all radiographic images ordered to evaluate for the above acute complaints and reviewed radiology reports and findings.  These findings  were personally discussed with the patient.  Please see medical record for radiology report.  ____________________________________________   PROCEDURES  Procedure(s) performed:  TRACHEOSTOMY REPLACEMENT  Date/Time: 12/05/2020 2:14 PM Performed by: Merlyn Lot, MD Authorized by: Merlyn Lot, MD  Consent: Verbal consent obtained. Risks and benefits: risks, benefits and alternatives were discussed Indications: fell out Local anesthesia used: no  Anesthesia: Local anesthesia used: no  Sedation: Patient sedated: no  Tube type: single cannula Tube cuff: cuffless Tube size:  4.0 mm Confirmation: Nasopharyngoscope used to confirm placement Patient tolerance: patient tolerated the procedure well with no immediate complications      Critical Care performed: no ____________________________________________   INITIAL IMPRESSION / ASSESSMENT AND PLAN / ED COURSE  Pertinent labs & imaging results that were available during my care of the patient were reviewed by me and considered in my medical decision making (see chart for details).   DDX: Displaced trach, stricture, false passage, tracheitis   Stacy Moran is a 65 y.o. who presents to the ED with presentation as described above.  He is saturating well.  Unable to pass 6 tracheostomy tube.  It sounds like this came out this morning.  States that she was having trouble breathing coughed came out.  The old trach and inner cannula are completely occluded with thick hard formed mucous appears to have been there for quite some time.  No bleeding at the site.  Clinical Course as of 12/05/20 1415  Fri Dec 05, 2020  1232 4 uncuffed trach was able to be passed.  Location confirmed with video fiberoptic visualization. [PR]  1322 Remains 100% on blow by.  96% on room air.  Feels significantly better. [PR]  [place] at patient's facility state that they have been exchanging the inner cannula however based  on the poor condition of the inner cannula that appeared to almost entirely occluded suspect that this has not been getting daily exchange.  This is on the she says she will address with staff and states that they are capable of daily respiratory and trach care.  As we have changed the size of her tracheostomy we will send new cannulas.  We have been observing her here in the ER and she is not showing any signs of desaturation she states that she feels well in no acute distress. [PR]    Clinical Course User Index [PR] Merlyn Lot, MD    The patient was evaluated in Emergency Department today for the symptoms described in the history of present illness. He/she was evaluated in the context of the global COVID-19 pandemic, which necessitated consideration that the patient might be at risk for infection with the SARS-CoV-2 virus that causes COVID-19. Institutional protocols and algorithms that pertain to the evaluation of patients at risk for COVID-19 are in a state of rapid change based on information released by regulatory bodies including the CDC and federal and state organizations. These policies and algorithms were followed during the patient's care in the ED.  As part of my medical decision making, I reviewed the following data within the [facility] notes reviewed and incorporated, Labs reviewed, notes from prior ED visits and [place] Controlled Substance Database   ____________________________________________   FINAL CLINICAL IMPRESSION(S) / ED DIAGNOSES  Final diagnoses:  Tracheostomy complication, unspecified complication type ([place])      NEW MEDICATIONS STARTED DURING THIS VISIT:  New Prescriptions   No medications on file     Note:  This document was prepared using Dragon voice recognition software and may include unintentional dictation errors.    Merlyn Lot, MD 12/05/20 1415

## 2020-12-05 NOTE — Discharge Instructions (Addendum)
It is VERY important that the inner cannula of this trach is exchanged every 12 hours..  We have included several of the inner cannulas that will fit this trach.  Be sure to perform trach suctioning every 4 hours to prevent clogging.  It is very important that you follow up with ENT, Dr. Constance Holster, for re-evaluation as we had to replace your trach with a smaller one today.  You may need to have a procedure to increase the size of trach again.  I have ordered a prescription for antibiotics that will hopefully help with secretions.  Return for any worsening shortness of breath, questions or concerns.

## 2020-12-05 NOTE — ED Notes (Signed)
MD Quentin Cornwall at bedside for update

## 2020-12-05 NOTE — ED Notes (Signed)
Pt assisted to restroom without issue

## 2020-12-05 NOTE — ED Notes (Addendum)
MD Quentin Cornwall at bedside, attempt to replace trach. Pt on 15LPM NRM, SPO2 >90%

## 2020-12-05 NOTE — ED Notes (Signed)
Pharmacy contacted twice about augmentin being tubed without success. MD Cheri Fowler made aware, states ok if it is not given prior to transport

## 2020-12-05 NOTE — ED Notes (Signed)
Pt resting, VSS, NAD at this time

## 2020-12-05 NOTE — ED Notes (Signed)
Replacement unsuccessful, RT to find smaller sizes to try

## 2020-12-05 NOTE — ED Notes (Signed)
Size 4 Shiley placed by MD Quentin Cornwall, secured with trach collar.

## 2020-12-05 NOTE — Progress Notes (Signed)
RT note:  Patient brought in by EMS in reference to respiratory distress due to trach dislodgement.  Patient known from previous admissions.  Lurline Idol is shiley size 6 cuffless (6CN75).  Patient on NRB mask with audible stridor and accessory muscle use.  Attempted to place new size 6 cuffed (flex) however resistance noted at stoma that prevented passage.  A size 4 cuffless shiley (legacy) was attempted, but again unable to be passed by RT.  ED physician attempts were unsuccessful as well.  Stoma dilated with nasopharyngeal airway by EDP.  No 6CN75 available in supply.  So a Shiley 6 cuffless (legacy) was attempted by EDP, but again would not pass.  A size 4 (legacy) was eventually passed by EDP.    Patient resting comfortably at time of note on 28% ATC.  Inner cannula of old trach noted to be completely occluded.  When asked, patient stated the last time it was changed was yesterday morning.  Patient also stated the facility took her passey muir valve and has not returned it.

## 2020-12-05 NOTE — ED Triage Notes (Signed)
BIBA from [facility] for dislodged trach by pt. Pt brought in code 3 with NRM over stoma site. EMS attempt to place trach without success. Pt alert, tachypnic, tachycardic on arrival.

## 2020-12-08 ENCOUNTER — Other Ambulatory Visit: Payer: Self-pay

## 2020-12-08 ENCOUNTER — Emergency Department: Payer: Medicaid Other

## 2020-12-08 ENCOUNTER — Emergency Department
Admission: EM | Admit: 2020-12-08 | Discharge: 2020-12-08 | Disposition: A | Discharge status: Home / Self Care | Payer: Medicaid Other | Attending: Emergency Medicine | Admitting: Emergency Medicine

## 2020-12-08 DIAGNOSIS — Z79899 Other long term (current) drug therapy: Secondary | ICD-10-CM | POA: Diagnosis not present

## 2020-12-08 DIAGNOSIS — J9503 Malfunction of tracheostomy stoma: Secondary | ICD-10-CM | POA: Insufficient documentation

## 2020-12-08 DIAGNOSIS — I10 Essential (primary) hypertension: Secondary | ICD-10-CM | POA: Insufficient documentation

## 2020-12-08 DIAGNOSIS — J9509 Other tracheostomy complication: Secondary | ICD-10-CM

## 2020-12-08 DIAGNOSIS — Z87891 Personal history of nicotine dependence: Secondary | ICD-10-CM | POA: Diagnosis not present

## 2020-12-08 DIAGNOSIS — E039 Hypothyroidism, unspecified: Secondary | ICD-10-CM | POA: Insufficient documentation

## 2020-12-08 DIAGNOSIS — R069 Unspecified abnormalities of breathing: Secondary | ICD-10-CM | POA: Diagnosis present

## 2020-12-08 IMAGING — DX DG CHEST 1V PORT
1 series · 1 of 1 positions shown · non-contrast
Comparison: Portable chest [DATE] and earlier.

CLINICAL DATA: 65-year-old female with shortness of breath. Recent
tracheostomy revision.

EXAM:
PORTABLE CHEST 1 VIEW

[chest ap]
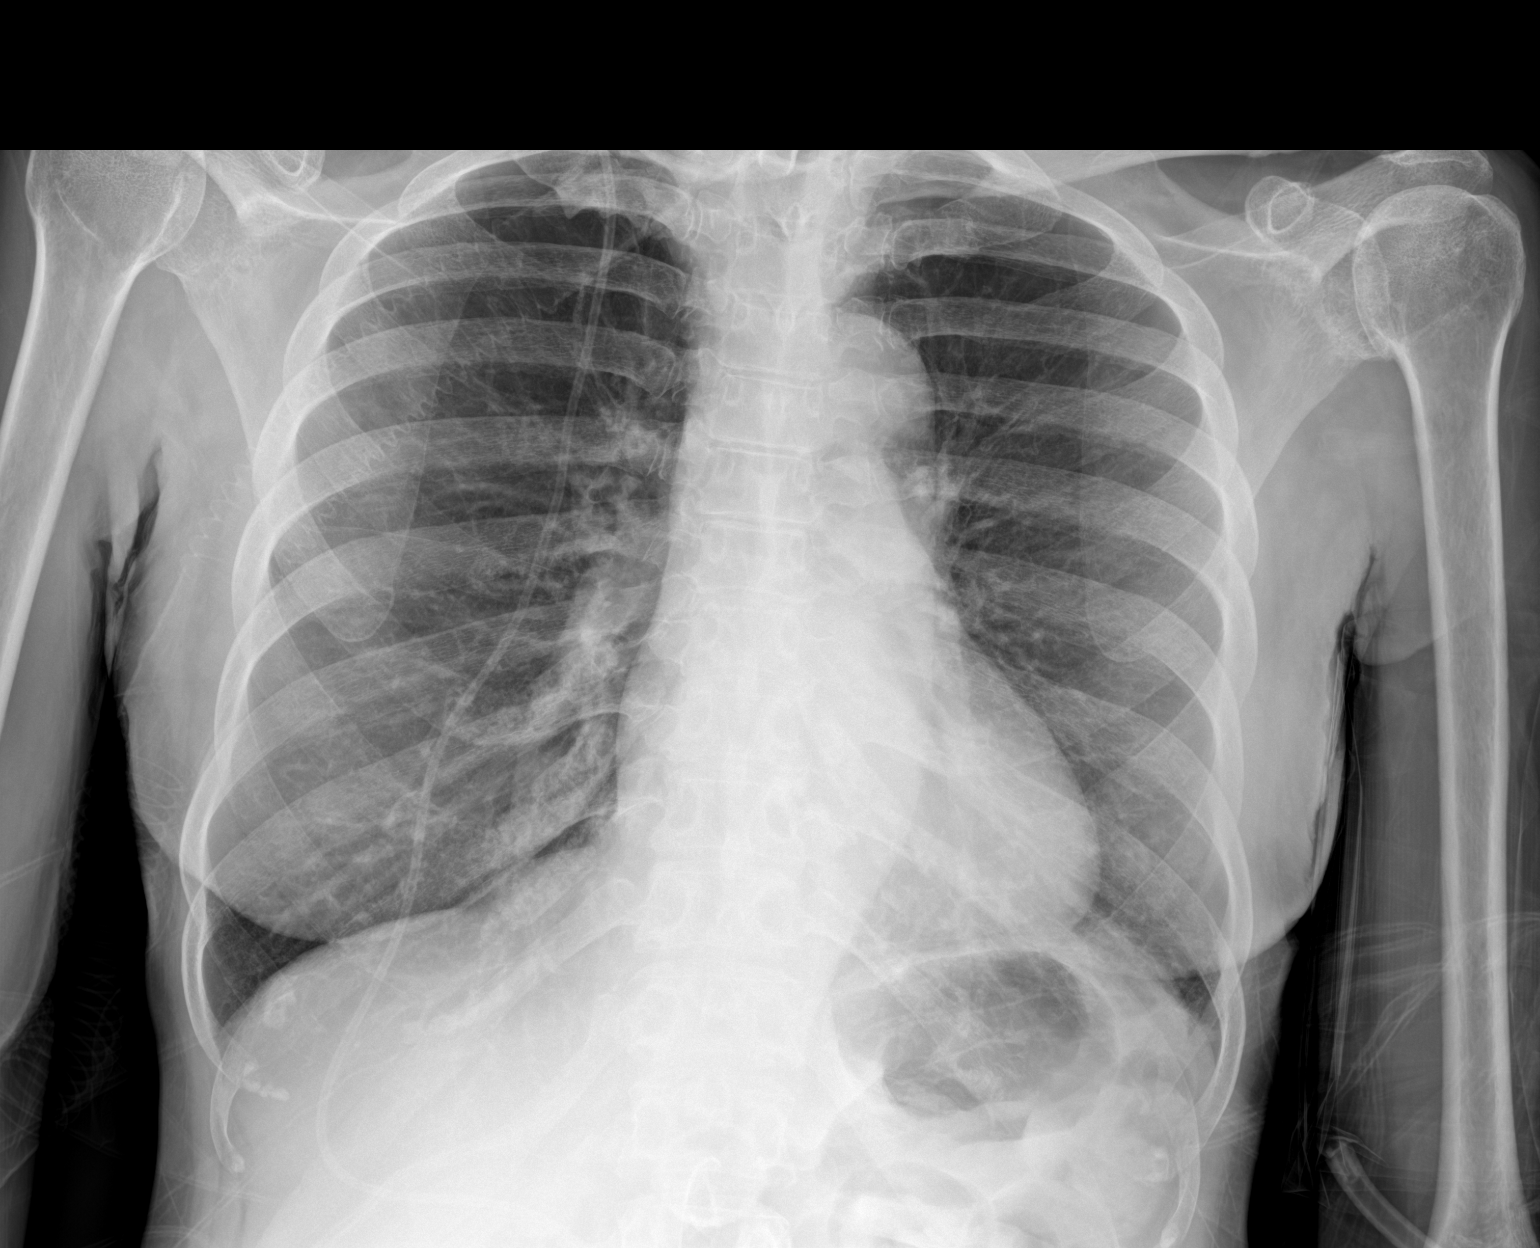

[1 of 1 positions shown; findings below may reference images not displayed]

FINDINGS: Portable AP upright view at [T4] hours. Improved lung volumes.
Normal cardiac size and mediastinal contours. Tracheostomy tube
projects in the midline with no adverse features. Chronic CSF shunt
tubing in the right chest. Allowing for portable technique the lungs
are clear. No pneumothorax or pleural effusion. No acute osseous
abnormality identified. Negative visible bowel gas pattern.
IMPRESSION: Tracheostomy with no adverse features. No acute cardiopulmonary
abnormality.

## 2020-12-08 NOTE — Progress Notes (Signed)
Patient with size 4 cuffless trach, c/o difficulty breathing. BBS rhonci with loud breathing noted around trach site. Trach suctioned for moderate amount of blood tinged secretions x2. Patient placed on 28% ATC for humdification. O2 sat noted at 100. Patient alert, cooperative, and states breathing is much better.

## 2020-12-08 NOTE — ED Notes (Addendum)
Patient given warm blanket and repositioned in bed awaiting transport back to facility. Respirations even and unlabored. No signs of distress.

## 2020-12-08 NOTE — ED Notes (Signed)
[facility]  CALLED FOR  TRANSPORT  TO  [facility] 

## 2020-12-08 NOTE — ED Triage Notes (Signed)
Via ems from anf for sob via trach.

## 2020-12-08 NOTE — ED Triage Notes (Signed)
Pt received a new trach cuff 2 days ago. Snf attempted to suction prior to calling ems. O2 sat 88% on ems arrival. Placed on NRB at 15L.

## 2020-12-08 NOTE — ED Notes (Signed)
[place] [facility] center 3 times before someone answered and was transferred to teal unit. No one answered on unit to give report.

## 2020-12-08 NOTE — ED Notes (Signed)
Transport at bedside. While transferring to stretcher, patient's inner cannula came out onto floor. Joni Fears, MD aware. RT called to replace inner cannula.

## 2020-12-08 NOTE — ED Provider Notes (Signed)
[facility] Emergency Department Provider Note  ____________________________________________  Time seen: Approximately 4:42 AM  I have reviewed the triage vital signs and the nursing notes.   HISTORY  Chief Complaint No chief complaint on file.   HPI Stacy Moran is a 65 y.o. 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 Patient had her trach tube exchanged in the ED 2 days ago after presenting with a completely occluded trach due to thick dry secretions. Unfortunately a 6 tracheostomy tube was unable to be replaced and patient received a 4 uncuffed trach tube instead. Was doing well until this evening.  Patient reports that she woke up and could not breathe.  She she was having coarse respirations.  The nursing home tried to suction her tracheostomy without significant relief.  Per EMS patient was initially hypoxic to the upper 80s.  Patient is not ventilator dependent and only use oxygen to sleep at night.   Past Medical History:  Diagnosis Date   Acute respiratory failure ([place])    Hypertension    Prosthetic eye globe    Ruptured aneurysm of artery (HCC)    Seizures (HCC)    Status post insertion of percutaneous endoscopic gastrostomy (PEG) tube (HCC)    Subdural hematoma (HCC)    Tracheostomy dependent ([place])    Tracheostomy present [facility])     Patient Active Problem List   Diagnosis Date Noted   Fall (on) (from) other stairs and steps, initial encounter 08/26/2020   Subdural hematoma ([place]) 07/31/2020   History of hemorrhagic cerebrovascular accident (CVA) with residual deficit 06/06/2020   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   S/P ventriculoperitoneal shunt 06/06/2020   Functional urinary incontinence 06/06/2020   Ineffective airway clearance    Thrombocytopenia (HCC)    Restlessness    Restlessness and agitation    Acute blood loss anemia     Essential hypertension    Seizure prophylaxis    Protein-calorie malnutrition, severe 04/24/2020   Dysphagia, post-stroke    Benign essential HTN    Dysphagia    Status post tracheostomy ([place])    S/P percutaneous endoscopic gastrostomy (PEG) tube placement (HCC)    ICH (intracerebral hemorrhage) ([place]) 04/22/2020   Obstructive hydrocephalus ([place])    Seizure-like activity ([place]) 03/12/2020   PEG (percutaneous endoscopic gastrostomy) status ([place]) 03/08/2020   Hyperglycemia 03/08/2020   Acute respiratory failure (HCC)    Ventilator dependence ([place])    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/19/2020   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/19/2020   Tracheostomy status ([place]) 02/19/2020   Abdominal distention    Ruptured aneurysm of artery (HCC)    SAH (subarachnoid hemorrhage) (HCC)    Subarachnoid bleed (HCC)    Tachypnea    Prediabetes    Hypokalemia    Leukocytosis    Ileus, postoperative ([place])    Pressure injury of skin 02/13/2020   Ruptured cerebral aneurysm ([place]) 01/30/2020    Past Surgical History:  Procedure Laterality Date   BURR HOLE N/A 03/18/2020   Procedure: Haskell Flirt;  Surgeon: [name], MD;  Location: [place];  Service: Neurosurgery;  Laterality: N/A;   CRANIOTOMY Left 01/30/2020   Procedure: LEFT FAR LATERAL CRANIOTOMY FOR ANEURYSM CLIPPING;  Surgeon: [name], MD;  Location: [place];  Service: Neurosurgery;  Laterality: Left;   DIRECT LARYNGOSCOPY N/A 02/29/2020   Procedure: DIRECT LARYNGOSCOPY;  Surgeon: [name], MD;  Location: [place];  Service: ENT;  Laterality: N/A;   DIRECT  LARYNGOSCOPY N/A 10/03/2020   Procedure: DIRECT LARYNGOSCOPY w/esophagascopy;  Surgeon: [name], MD;  Location: [place];  Service: ENT;  Laterality: N/A;   IR ANGIO INTRA EXTRACRAN SEL INTERNAL CAROTID BILAT MOD SED  01/30/2020   IR ANGIO VERTEBRAL SEL VERTEBRAL UNI L MOD SED  01/30/2020   IR CM INJ ANY COLONIC TUBE W/FLUORO  07/03/2020   IR GASTROSTOMY TUBE MOD SED   02/22/2020   IR [place] GASTRO/COLONIC TUBE PERCUT W/FLUORO  07/03/2020   LAPAROSCOPIC REVISION VENTRICULAR-PERITONEAL (V-P) SHUNT N/A 03/18/2020   Procedure: LAPAROSCOPIC REVISION VENTRICULAR-PERITONEAL (V-P) SHUNT;  Surgeon: [name], MD;  Location: [place];  Service: General;  Laterality: N/A;   RADIOLOGY WITH ANESTHESIA N/A 01/30/2020   Procedure: IR WITH ANESTHESIA;  Surgeon: [name], MD;  Location: [place];  Service: Radiology;  Laterality: N/A;   THYROIDECTOMY N/A 02/08/2020   Procedure: THYROIDECTOMY;  Surgeon: [name], MD;  Location: [place];  Service: ENT;  Laterality: N/A;   TRACHEOSTOMY TUBE PLACEMENT N/A 02/08/2020   Procedure: TRACHEOSTOMY;  Surgeon: [name], MD;  Location: [place];  Service: ENT;  Laterality: N/A;   TRACHEOSTOMY TUBE PLACEMENT N/A 02/29/2020   Procedure: TRACHEOSTOMY EXCHANGE;  Surgeon: [name], MD;  Location: [place];  Service: ENT;  Laterality: N/A;   VENTRICULOPERITONEAL SHUNT N/A 03/18/2020   Procedure: RIGHT SHUNT INSERTION VENTRICULAR-PERITONEAL/ BURR HOLE Evacuation of Subdural Hematoma;  Surgeon: [name], MD;  Location: [place];  Service: Neurosurgery;  Laterality: N/A;    Prior to Admission medications   Medication Sig Start Date End Date Taking? Authorizing Provider  acetaminophen (TYLENOL) 325 MG tablet Place 2 tablets (650 mg total) into feeding tube every 6 (six) hours as needed for mild pain. 05/20/20   Angiulli, [name], PA-C  amLODipine (NORVASC) 10 MG tablet PLACE 1 TABLET (10 MG TOTAL) INTO FEEDING TUBE DAILY. 08/10/20 08/10/21  [name], MD  amoxicillin-clavulanate (AUGMENTIN) 250-62.5 MG/5ML suspension Place 17.5 mLs (875 mg total) into feeding tube 2 (two) times daily for 7 days. 12/05/20 12/12/20  Merlyn Lot, MD  chlorhexidine (PERIDEX) 0.12 % solution 15 mLs by Mouth Rinse route 2 (two) times daily. 10/07/20   [name], DO  levETIRAcetam (KEPPRA) 100 MG/ML solution Place 7.5 mLs (750 mg total) into  feeding tube 2 (two) times daily. 10/07/20   [name], DO  levothyroxine (SYNTHROID) 100 MCG tablet Place 1 tablet (100 mcg total) into feeding tube daily at 6 (six) AM. 07/16/20   [name], MD  Nutritional Supplements (FEEDING SUPPLEMENT, OSMOLITE 1.2 CAL,) LIQD Place 355 mLs into feeding tube 4 (four) times daily -  with meals and at bedtime. 05/20/20   Angiulli, [name], PA-C  Nutritional Supplements (FEEDING SUPPLEMENT, PROSOURCE TF,) liquid Place 45 mLs into feeding tube 2 (two) times daily. 05/20/20   Angiulli, [name], PA-C  polyethylene glycol (MIRALAX / GLYCOLAX) 17 g packet Place 17 g into feeding tube 2 (two) times daily. 10/07/20   [name], DO  Probiotic NICU (GERBER SOOTHE) LIQD Take 5 drops by mouth daily at 8 pm for 10 days. 12/05/20 12/15/20  Merlyn Lot, MD  QUEtiapine (SEROQUEL) 50 MG tablet PLACE 1 TABLET (50 MG TOTAL) INTO FEEDING TUBE 2 (TWO) TIMES DAILY. 07/29/20 07/29/21  Jamse Arn, MD  senna (SENOKOT) 8.6 MG TABS tablet Place 1 tablet (8.6 mg total) into feeding tube daily. 10/08/20   [name], DO  Water For Irrigation, Sterile (FREE WATER) SOLN Place 200 mLs into feeding tube every  6 (six) hours. 05/20/20   Angiulli, [name], PA-C    Allergies Patient has no known allergies.  History reviewed. No pertinent family history.  Social History Social History   Tobacco Use   Smoking status: Former   Smokeless tobacco: Never  Scientific laboratory technician Use: Never used  Substance Use Topics   Alcohol use: Not Currently   Drug use: Not Currently    Review of Systems  Constitutional: Negative for fever. Eyes: Negative for visual changes. ENT: Negative for sore throat. Neck: No neck pain  Cardiovascular: Negative for chest pain. Respiratory: + shortness of breath. Gastrointestinal: Negative for abdominal pain, vomiting or diarrhea. Genitourinary: Negative for dysuria. Musculoskeletal: Negative for back pain. Skin: Negative for  rash. Neurological: Negative for headaches, weakness or numbness. Psych: No SI or HI  ____________________________________________   PHYSICAL EXAM:  VITAL SIGNS: ED Triage Vitals  Enc Vitals Group     BP 12/08/20 0351 140/85     Pulse Rate 12/08/20 0351 96     Resp 12/08/20 0351 18     Temp 12/08/20 0351 97.8 F (36.6 C)     Temp Source 12/08/20 0351 Oral     SpO2 12/08/20 0344 (!) 88 %     Weight 12/08/20 0354 129 lb 13.6 oz (58.9 kg)     Height 12/08/20 0345 '5\' 6"'$  (1.676 m)     Head Circumference --      Peak Flow --      Pain Score 12/08/20 0345 0     Pain Loc --      Pain Edu? --      Excl. in [place]? --     Constitutional: Alert and oriented, moderate respiratory distress.  HEENT:      Head: Normocephalic and atraumatic.         Eyes: Conjunctivae are normal. Sclera is non-icteric.       Mouth/Throat: Mucous membranes are moist.       Neck: Supple with no signs of meningismus. Cardiovascular: Regular rate and rhythm. No murmurs, gallops, or rubs. 2+ symmetrical distal pulses are present in all extremities. No JVD. Respiratory: Coarse upper rhonchi, clear lungs, increased WOB, satting 90% on RA Gastrointestinal: Soft, non tender. Musculoskeletal:  No edema, cyanosis, or erythema of extremities. Neurologic: Normal speech and language. Face is symmetric. Moving all extremities. No gross focal neurologic deficits are appreciated. Skin: Skin is warm, dry and intact. No rash noted. Psychiatric: Mood and affect are normal. Speech and behavior are normal.  ____________________________________________   LABS (all labs ordered are listed, but only abnormal results are displayed)  Labs Reviewed - No data to display ____________________________________________  EKG   none ____________________________________________  RADIOLOGY  I have personally reviewed the images performed during this visit and I agree with the Radiologist's read.   Interpretation by Radiologist:   DG Chest Portable 1 View  Result Date: 12/08/2020 CLINICAL DATA:  65 year old female with shortness of breath. Recent tracheostomy revision. EXAM: PORTABLE CHEST 1 VIEW COMPARISON:  Portable chest 12/05/2020 and earlier. FINDINGS: Portable AP upright view at 0358 hours. Improved lung volumes. Normal cardiac size and mediastinal contours. Tracheostomy tube projects in the midline with no adverse features. Chronic CSF shunt tubing in the right chest. Allowing for portable technique the lungs are clear. No pneumothorax or pleural effusion. No acute osseous abnormality identified. Negative visible bowel gas pattern. IMPRESSION: Tracheostomy with no adverse features. No acute cardiopulmonary abnormality. Electronically Signed   By: Herminio Heads.D.  On: 12/08/2020 04:15     ____________________________________________   PROCEDURES  Procedure(s) performed: None Procedures Critical Care performed:  None ____________________________________________   INITIAL IMPRESSION / ASSESSMENT AND PLAN / ED COURSE  65 y.o. 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  Patient arrives with a clog tracheostomy tube with thick clear secretions there was suction by respiratory therapist.  Initially with coarse upper rhonchi which resolved after suctioning.  Patient felt back to normal.  Sating 96% on room air.  Chest x-ray with no acute findings.  We did discuss trach care at the nursing home.  Patient was monitored for 2.5 hours after suctioning with no recurrence of her symptoms.  Patient stable for discharge back.      _____________________________________________ Please note:  Patient was evaluated in Emergency Department today for the symptoms described in the history of present illness. Patient was evaluated in the context of the global COVID-19 pandemic, which necessitated consideration that the patient might be  at risk for infection with the SARS-CoV-2 virus that causes COVID-19. Institutional protocols and algorithms that pertain to the evaluation of patients at risk for COVID-19 are in a state of rapid change based on information released by regulatory bodies including the CDC and federal and state organizations. These policies and algorithms were followed during the patient's care in the ED.  Some ED evaluations and interventions may be delayed as a result of limited staffing during the pandemic.   [place] Controlled Substance Database was reviewed by me. ____________________________________________   FINAL CLINICAL IMPRESSION(S) / ED DIAGNOSES   Final diagnoses:  Tracheostomy obstruction ([place])      NEW MEDICATIONS STARTED DURING THIS VISIT:  ED Discharge Orders     None        Note:  This document was prepared using Dragon voice recognition software and may include unintentional dictation errors.    Alfred Levins, [place], MD 12/08/20 [phone]

## 2020-12-08 NOTE — ED Notes (Signed)
Called SNF let it ring for 3 min, no answer

## 2020-12-08 NOTE — ED Notes (Signed)
Turned O2 off per MD. Pt does not use o2 at home.

## 2020-12-14 ENCOUNTER — Other Ambulatory Visit: Payer: Self-pay

## 2020-12-14 ENCOUNTER — Encounter
Admission: EM | Disposition: A | Discharge status: Skilled Nursing Facility | Payer: Self-pay | Source: Home / Self Care | Attending: Internal Medicine

## 2020-12-14 ENCOUNTER — Inpatient Hospital Stay
Admission: EM | Admit: 2020-12-14 | Discharge: 2021-02-18 | DRG: 208 | Disposition: A | Discharge status: Skilled Nursing Facility | Payer: Medicaid Other | Attending: Internal Medicine | Admitting: Internal Medicine

## 2020-12-14 ENCOUNTER — Encounter: Payer: Self-pay | Admitting: *Deleted

## 2020-12-14 ENCOUNTER — Emergency Department: Payer: Medicaid Other

## 2020-12-14 ENCOUNTER — Emergency Department: Payer: Medicaid Other | Admitting: Anesthesiology

## 2020-12-14 DIAGNOSIS — G919 Hydrocephalus, unspecified: Secondary | ICD-10-CM | POA: Diagnosis present

## 2020-12-14 DIAGNOSIS — I6930 Unspecified sequelae of cerebral infarction: Secondary | ICD-10-CM

## 2020-12-14 DIAGNOSIS — D696 Thrombocytopenia, unspecified: Secondary | ICD-10-CM | POA: Diagnosis present

## 2020-12-14 DIAGNOSIS — E43 Unspecified severe protein-calorie malnutrition: Secondary | ICD-10-CM | POA: Diagnosis present

## 2020-12-14 DIAGNOSIS — E890 Postprocedural hypothyroidism: Secondary | ICD-10-CM | POA: Diagnosis present

## 2020-12-14 DIAGNOSIS — E11649 Type 2 diabetes mellitus with hypoglycemia without coma: Secondary | ICD-10-CM | POA: Diagnosis present

## 2020-12-14 DIAGNOSIS — R0603 Acute respiratory distress: Secondary | ICD-10-CM

## 2020-12-14 DIAGNOSIS — Z79890 Hormone replacement therapy (postmenopausal): Secondary | ICD-10-CM | POA: Diagnosis not present

## 2020-12-14 DIAGNOSIS — Z982 Presence of cerebrospinal fluid drainage device: Secondary | ICD-10-CM

## 2020-12-14 DIAGNOSIS — Z87891 Personal history of nicotine dependence: Secondary | ICD-10-CM

## 2020-12-14 DIAGNOSIS — D72819 Decreased white blood cell count, unspecified: Secondary | ICD-10-CM | POA: Diagnosis present

## 2020-12-14 DIAGNOSIS — Z931 Gastrostomy status: Secondary | ICD-10-CM

## 2020-12-14 DIAGNOSIS — F419 Anxiety disorder, unspecified: Secondary | ICD-10-CM | POA: Diagnosis present

## 2020-12-14 DIAGNOSIS — J9601 Acute respiratory failure with hypoxia: Secondary | ICD-10-CM | POA: Diagnosis present

## 2020-12-14 DIAGNOSIS — E876 Hypokalemia: Secondary | ICD-10-CM | POA: Diagnosis present

## 2020-12-14 DIAGNOSIS — E1165 Type 2 diabetes mellitus with hyperglycemia: Secondary | ICD-10-CM | POA: Diagnosis present

## 2020-12-14 DIAGNOSIS — R739 Hyperglycemia, unspecified: Secondary | ICD-10-CM | POA: Diagnosis not present

## 2020-12-14 DIAGNOSIS — R569 Unspecified convulsions: Secondary | ICD-10-CM | POA: Diagnosis not present

## 2020-12-14 DIAGNOSIS — R112 Nausea with vomiting, unspecified: Secondary | ICD-10-CM

## 2020-12-14 DIAGNOSIS — Z20822 Contact with and (suspected) exposure to covid-19: Secondary | ICD-10-CM | POA: Diagnosis present

## 2020-12-14 DIAGNOSIS — R059 Cough, unspecified: Secondary | ICD-10-CM

## 2020-12-14 DIAGNOSIS — Z970 Presence of artificial eye: Secondary | ICD-10-CM | POA: Diagnosis not present

## 2020-12-14 DIAGNOSIS — E039 Hypothyroidism, unspecified: Secondary | ICD-10-CM | POA: Diagnosis not present

## 2020-12-14 DIAGNOSIS — E162 Hypoglycemia, unspecified: Secondary | ICD-10-CM | POA: Diagnosis not present

## 2020-12-14 DIAGNOSIS — Y838 Other surgical procedures as the cause of abnormal reaction of the patient, or of later complication, without mention of misadventure at the time of the procedure: Secondary | ICD-10-CM | POA: Diagnosis present

## 2020-12-14 DIAGNOSIS — Z930 Tracheostomy status: Secondary | ICD-10-CM

## 2020-12-14 DIAGNOSIS — Z6820 Body mass index (BMI) 20.0-20.9, adult: Secondary | ICD-10-CM

## 2020-12-14 DIAGNOSIS — Z79899 Other long term (current) drug therapy: Secondary | ICD-10-CM | POA: Diagnosis not present

## 2020-12-14 DIAGNOSIS — Z789 Other specified health status: Secondary | ICD-10-CM | POA: Diagnosis not present

## 2020-12-14 DIAGNOSIS — I10 Essential (primary) hypertension: Secondary | ICD-10-CM | POA: Diagnosis present

## 2020-12-14 DIAGNOSIS — J9503 Malfunction of tracheostomy stoma: Principal | ICD-10-CM | POA: Diagnosis present

## 2020-12-14 DIAGNOSIS — J9600 Acute respiratory failure, unspecified whether with hypoxia or hypercapnia: Secondary | ICD-10-CM | POA: Diagnosis not present

## 2020-12-14 DIAGNOSIS — J9621 Acute and chronic respiratory failure with hypoxia: Secondary | ICD-10-CM | POA: Diagnosis present

## 2020-12-14 DIAGNOSIS — F39 Unspecified mood [affective] disorder: Secondary | ICD-10-CM | POA: Diagnosis present

## 2020-12-14 DIAGNOSIS — Z23 Encounter for immunization: Secondary | ICD-10-CM | POA: Diagnosis not present

## 2020-12-14 DIAGNOSIS — J3802 Paralysis of vocal cords and larynx, bilateral: Secondary | ICD-10-CM | POA: Diagnosis present

## 2020-12-14 DIAGNOSIS — Z7189 Other specified counseling: Secondary | ICD-10-CM | POA: Diagnosis not present

## 2020-12-14 DIAGNOSIS — G40909 Epilepsy, unspecified, not intractable, without status epilepticus: Secondary | ICD-10-CM | POA: Diagnosis present

## 2020-12-14 DIAGNOSIS — Z515 Encounter for palliative care: Secondary | ICD-10-CM | POA: Diagnosis not present

## 2020-12-14 DIAGNOSIS — R1310 Dysphagia, unspecified: Secondary | ICD-10-CM | POA: Diagnosis present

## 2020-12-14 HISTORY — PX: INTUBATION-ENDOTRACHEAL WITH TRACHEOSTOMY STANDBY: SHX6592

## 2020-12-14 LAB — GLUCOSE, CAPILLARY
Glucose-Capillary: 70 mg/dL (ref 70–99)
Glucose-Capillary: 118 mg/dL — ABNORMAL HIGH (ref 70–99)
Glucose-Capillary: 101 mg/dL — ABNORMAL HIGH (ref 70–99)
Glucose-Capillary: 93 mg/dL (ref 70–99)
Glucose-Capillary: 101 mg/dL — ABNORMAL HIGH (ref 70–99)

## 2020-12-14 LAB — CBC WITH DIFFERENTIAL/PLATELET
Abs Immature Granulocytes: 0.02 10*3/uL (ref 0.00–0.07)
Basophils Absolute: 0 10*3/uL (ref 0.0–0.1)
Basophils Relative: 0 %
Eosinophils Absolute: 0.1 10*3/uL (ref 0.0–0.5)
Eosinophils Relative: 1 %
HCT: 42.3 % (ref 36.0–46.0)
Hemoglobin: 13.3 g/dL (ref 12.0–15.0)
Immature Granulocytes: 0 %
Lymphocytes Relative: 42 %
Lymphs Abs: 4.4 10*3/uL — ABNORMAL HIGH (ref 0.7–4.0)
MCH: 26.3 pg (ref 26.0–34.0)
MCHC: 31.4 g/dL (ref 30.0–36.0)
MCV: 83.6 fL (ref 80.0–100.0)
Monocytes Absolute: 0.6 10*3/uL (ref 0.1–1.0)
Monocytes Relative: 5 %
Neutro Abs: 5.5 10*3/uL (ref 1.7–7.7)
Neutrophils Relative %: 52 %
Platelets: 154 10*3/uL (ref 150–400)
RBC: 5.06 MIL/uL (ref 3.87–5.11)
RDW: 13.9 % (ref 11.5–15.5)
WBC: 10.6 10*3/uL — ABNORMAL HIGH (ref 4.0–10.5)
nRBC: 0 % (ref 0.0–0.2)

## 2020-12-14 LAB — BASIC METABOLIC PANEL
Anion gap: 9 (ref 5–15)
Anion gap: 8 (ref 5–15)
BUN: 23 mg/dL (ref 8–23)
BUN: 20 mg/dL (ref 8–23)
CO2: 31 mmol/L (ref 22–32)
CO2: 30 mmol/L (ref 22–32)
Calcium: 9.8 mg/dL (ref 8.9–10.3)
Calcium: 9.4 mg/dL (ref 8.9–10.3)
Chloride: 102 mmol/L (ref 98–111)
Chloride: 104 mmol/L (ref 98–111)
Creatinine, Ser: 0.66 mg/dL (ref 0.44–1.00)
Creatinine, Ser: 0.67 mg/dL (ref 0.44–1.00)
GFR, Estimated: >60 mL/min (ref >60)
GFR, Estimated: >60 mL/min (ref >60)
Glucose, Bld: 222 mg/dL — ABNORMAL HIGH (ref 70–99)
Glucose, Bld: 113 mg/dL — ABNORMAL HIGH (ref 70–99)
Potassium: 3.4 mmol/L — ABNORMAL LOW (ref 3.5–5.1)
Potassium: 4.2 mmol/L (ref 3.5–5.1)
Sodium: 142 mmol/L (ref 135–145)
Sodium: 142 mmol/L (ref 135–145)

## 2020-12-14 LAB — RESP PANEL BY RT-PCR (FLU A&B, COVID) ARPGX2
Influenza A by PCR: NEGATIVE
Influenza B by PCR: NEGATIVE
SARS Coronavirus 2 by RT PCR: NEGATIVE

## 2020-12-14 LAB — PROTIME-INR
INR: 1 (ref 0.8–1.2)
Prothrombin Time: 13.1 seconds (ref 11.4–15.2)

## 2020-12-14 LAB — TYPE AND SCREEN
ABO/RH(D): A POS
Antibody Screen: NEGATIVE

## 2020-12-14 LAB — BLOOD GAS, VENOUS
Acid-Base Excess: 8.2 mmol/L — ABNORMAL HIGH (ref 0.0–2.0)
Bicarbonate: 33.9 mmol/L — ABNORMAL HIGH (ref 20.0–28.0)
O2 Saturation: 93.5 %
Patient temperature: 37
pCO2, Ven: 51 mmHg (ref 44.0–60.0)
pH, Ven: 7.43 (ref 7.250–7.430)
pO2, Ven: 67 mmHg — ABNORMAL HIGH (ref 32.0–45.0)

## 2020-12-14 LAB — PHOSPHORUS: Phosphorus: 3.6 mg/dL (ref 2.5–4.6)

## 2020-12-14 LAB — MAGNESIUM: Magnesium: 2.3 mg/dL (ref 1.7–2.4)

## 2020-12-14 LAB — HEMOGLOBIN A1C
Hgb A1c MFr Bld: 6.2 % — ABNORMAL HIGH (ref 4.8–5.6)
Mean Plasma Glucose: 131.24 mg/dL

## 2020-12-14 LAB — CBG MONITORING, ED: Glucose-Capillary: 162 mg/dL — ABNORMAL HIGH (ref 70–99)

## 2020-12-14 IMAGING — DX DG CHEST 1V PORT
1 series · 1 of 1 positions shown · non-contrast
Comparison: [DATE]

CLINICAL DATA: Respiratory distress

EXAM:
PORTABLE CHEST 1 VIEW

[chest ap]
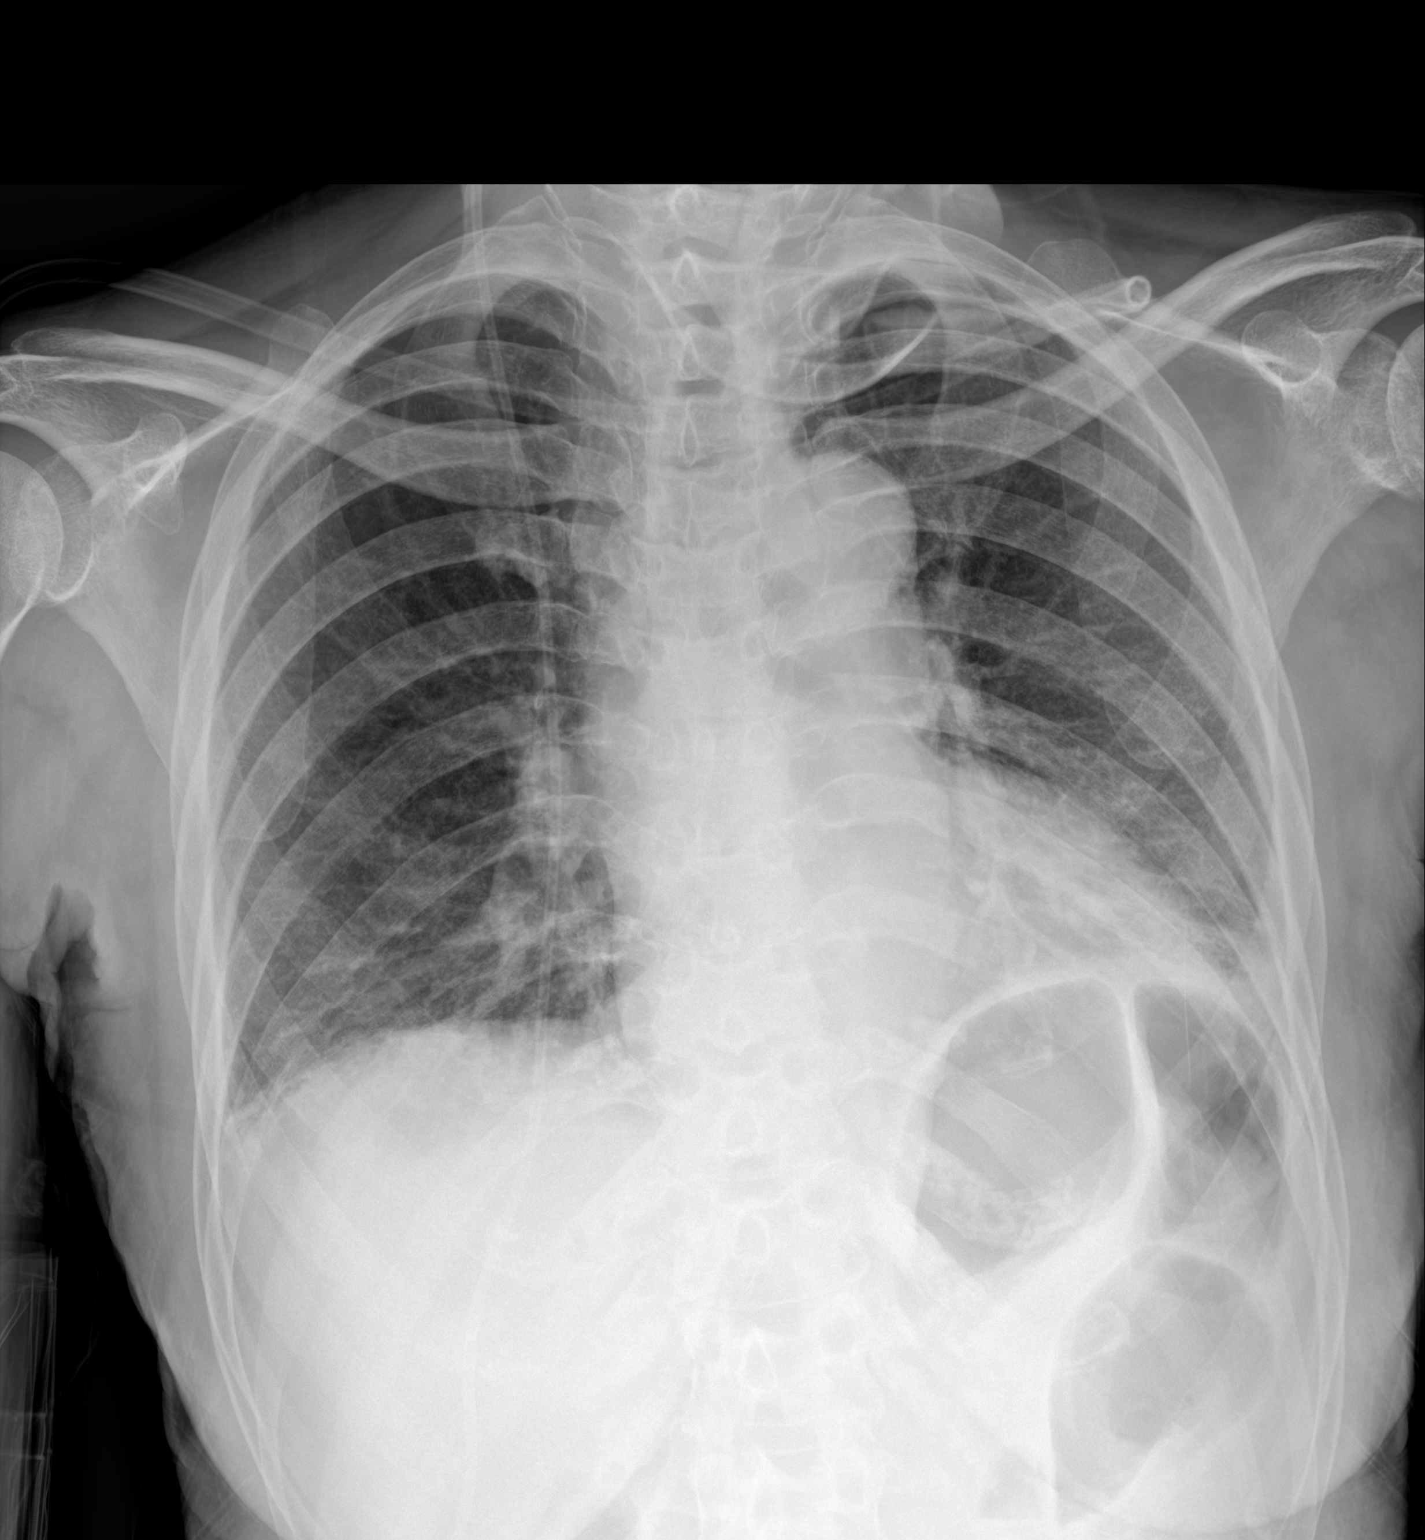

[1 of 1 positions shown; findings below may reference images not displayed]

FINDINGS: Lower lung volumes with bibasilar atelectasis. Previously noted
tracheostomy tube is no longer seen. A CSF shunt tube overlies the
right neck and hemithorax. Unchanged cardiac and mediastinal
contours. No definite pleural effusion. No acute osseous
abnormality.
IMPRESSION: 1. Previously noted tracheostomy tube is no longer seen.
Redemonstrated ventriculoperitoneal shunt.
2. Lower lung volumes with bibasilar atelectasis. No other focal
pulmonary opacity.

## 2020-12-14 SURGERY — INTUBATION-ENDOTRACHEAL WITH TRACHEOSTOMY STANDBY
Anesthesia: Regional

## 2020-12-14 MED ORDER — SUCCINYLCHOLINE CHLORIDE 200 MG/10ML IV SOSY
PREFILLED_SYRINGE | INTRAVENOUS | Status: AC
  Filled 2020-12-14: qty 10

## 2020-12-14 MED ORDER — ROCURONIUM BROMIDE 10 MG/ML (PF) SYRINGE
PREFILLED_SYRINGE | INTRAVENOUS | Status: AC
  Filled 2020-12-14: qty 10

## 2020-12-14 MED ORDER — CHLORHEXIDINE GLUCONATE CLOTH 2 % EX PADS
6.0000 | MEDICATED_PAD | Freq: Every day | CUTANEOUS | Status: DC
  Administered 2020-12-15 – 2021-02-18 (×61): 6 via TOPICAL

## 2020-12-14 MED ORDER — SENNA 8.6 MG PO TABS
1.0000 | ORAL_TABLET | Freq: Every day | ORAL | Status: DC
  Administered 2020-12-14 – 2021-02-18 (×35): 8.6 mg
  Filled 2020-12-14 (×45): qty 1

## 2020-12-14 MED ORDER — LEVOTHYROXINE SODIUM 100 MCG PO TABS
100.0000 ug | ORAL_TABLET | Freq: Every day | ORAL | Status: DC
  Administered 2020-12-14 – 2021-02-18 (×67): 100 ug
  Filled 2020-12-14 (×66): qty 1

## 2020-12-14 MED ORDER — POLYETHYLENE GLYCOL 3350 17 G PO PACK
17.0000 g | PACK | Freq: Two times a day (BID) | ORAL | Status: DC
  Administered 2020-12-14 – 2021-02-18 (×79): 17 g
  Filled 2020-12-14 (×105): qty 1

## 2020-12-14 MED ORDER — QUETIAPINE FUMARATE 25 MG PO TABS
50.0000 mg | ORAL_TABLET | Freq: Two times a day (BID) | ORAL | Status: DC
  Administered 2020-12-14 – 2021-02-18 (×129): 50 mg
  Filled 2020-12-14 (×130): qty 2

## 2020-12-14 MED ORDER — INSULIN ASPART 100 UNIT/ML IJ SOLN
0.0000 [IU] | INTRAMUSCULAR | Status: DC
  Administered 2020-12-15: 06:00:00 2 [IU] via SUBCUTANEOUS
  Administered 2020-12-15: 3 [IU] via SUBCUTANEOUS
  Administered 2020-12-15: 1 [IU] via SUBCUTANEOUS
  Administered 2020-12-16 (×2): 3 [IU] via SUBCUTANEOUS
  Filled 2020-12-14 (×5): qty 1

## 2020-12-14 MED ORDER — MIDAZOLAM HCL 2 MG/2ML IJ SOLN
INTRAMUSCULAR | Status: DC | PRN
  Administered 2020-12-14 (×2): .5 mg via INTRAVENOUS

## 2020-12-14 MED ORDER — LACTATED RINGERS IV SOLN: INTRAVENOUS | Status: DC | PRN

## 2020-12-14 MED ORDER — ALBUTEROL SULFATE (2.5 MG/3ML) 0.083% IN NEBU: 2.5000 mg | INHALATION_SOLUTION | Freq: Four times a day (QID) | RESPIRATORY_TRACT | Status: DC | PRN

## 2020-12-14 MED ORDER — POTASSIUM CHLORIDE 20 MEQ PO PACK
20.0000 meq | PACK | Freq: Once | ORAL | Status: AC
  Administered 2020-12-14: 20 meq
  Filled 2020-12-14: qty 1

## 2020-12-14 MED ORDER — FREE WATER
100.0000 mL | Status: DC
  Administered 2020-12-14 – 2020-12-15 (×7): 100 mL

## 2020-12-14 MED ORDER — POTASSIUM CHLORIDE 10 MEQ/100ML IV SOLN
10.0000 meq | INTRAVENOUS | Status: DC
  Filled 2020-12-14 (×2): qty 100

## 2020-12-14 MED ORDER — ETOMIDATE 2 MG/ML IV SOLN
INTRAVENOUS | Status: AC
  Filled 2020-12-14: qty 20

## 2020-12-14 MED ORDER — DOCUSATE SODIUM 100 MG PO CAPS: 100.0000 mg | ORAL_CAPSULE | Freq: Two times a day (BID) | ORAL | Status: DC | PRN

## 2020-12-14 MED ORDER — OSMOLITE 1.2 CAL PO LIQD
1000.0000 mL | ORAL | Status: DC
  Administered 2020-12-14 – 2020-12-15 (×2): 1000 mL

## 2020-12-14 MED ORDER — ENOXAPARIN SODIUM 40 MG/0.4ML IJ SOSY
40.0000 mg | PREFILLED_SYRINGE | INTRAMUSCULAR | Status: DC
Start: 2020-12-14 — End: 2020-12-22
  Administered 2020-12-14 – 2020-12-21 (×8): 40 mg via SUBCUTANEOUS
  Filled 2020-12-14 (×8): qty 0.4

## 2020-12-14 MED ORDER — ACETAMINOPHEN 325 MG PO TABS
650.0000 mg | ORAL_TABLET | Freq: Four times a day (QID) | ORAL | Status: DC | PRN
  Administered 2021-01-01: 650 mg
  Filled 2020-12-14: qty 2

## 2020-12-14 MED ORDER — DEXMEDETOMIDINE HCL IN NACL 400 MCG/100ML IV SOLN
0.4000 ug/kg/h | INTRAVENOUS | Status: DC
Start: 2020-12-14 — End: 2020-12-14

## 2020-12-14 MED ORDER — KETAMINE HCL 50 MG/5ML IJ SOSY
PREFILLED_SYRINGE | INTRAMUSCULAR | Status: AC
  Filled 2020-12-14: qty 5

## 2020-12-14 MED ORDER — MIDAZOLAM HCL 2 MG/2ML IJ SOLN
INTRAMUSCULAR | Status: AC
  Filled 2020-12-14: qty 2

## 2020-12-14 MED ORDER — PROPOFOL 10 MG/ML IV BOLUS
INTRAVENOUS | Status: DC | PRN
  Administered 2020-12-14 (×3): 50 mg via INTRAVENOUS

## 2020-12-14 MED ORDER — PANTOPRAZOLE SODIUM 40 MG IV SOLR
40.0000 mg | Freq: Every day | INTRAVENOUS | Status: DC
  Administered 2020-12-14 – 2020-12-16 (×3): 40 mg via INTRAVENOUS
  Filled 2020-12-14 (×3): qty 40

## 2020-12-14 MED ORDER — PROPOFOL 10 MG/ML IV BOLUS
INTRAVENOUS | Status: AC
  Filled 2020-12-14: qty 20

## 2020-12-14 MED ORDER — POLYETHYLENE GLYCOL 3350 17 G PO PACK
17.0000 g | PACK | Freq: Every day | ORAL | Status: DC | PRN
  Filled 2020-12-14: qty 1

## 2020-12-14 MED ORDER — AMLODIPINE BESYLATE 10 MG PO TABS
10.0000 mg | ORAL_TABLET | Freq: Every day | ORAL | Status: DC
  Administered 2020-12-14 – 2020-12-28 (×15): 10 mg
  Filled 2020-12-14 (×16): qty 1

## 2020-12-14 MED ORDER — LEVETIRACETAM 100 MG/ML PO SOLN
750.0000 mg | Freq: Two times a day (BID) | ORAL | Status: DC
  Administered 2020-12-14 – 2021-02-18 (×132): 750 mg
  Filled 2020-12-14 (×137): qty 7.5

## 2020-12-14 SURGICAL SUPPLY — 11 items
BLADE SURG 15 STRL LF DISP TIS (BLADE) ×1 IMPLANT
BLADE SURG 15 STRL SS (BLADE) ×1
BLADE SURG SZ11 CARB STEEL (BLADE) ×2 IMPLANT
GAUZE 4X4 16PLY ~~LOC~~+RFID DBL (SPONGE) ×2 IMPLANT
GLOVE SURG ENC MOIS LTX SZ7.5 (GLOVE) ×2 IMPLANT
GOWN STRL REUS W/ TWL LRG LVL3 (GOWN DISPOSABLE) ×1 IMPLANT
GOWN STRL REUS W/TWL LRG LVL3 (GOWN DISPOSABLE) ×1
MANIFOLD NEPTUNE II (INSTRUMENTS) IMPLANT
PACK HEAD/NECK (MISCELLANEOUS) IMPLANT
TUBE TRACH FLEX 4.0 CUFF (MISCELLANEOUS) ×2 IMPLANT
WATER STERILE IRR 500ML POUR (IV SOLUTION) ×2 IMPLANT

## 2020-12-14 NOTE — ED Triage Notes (Signed)
Pt brought in by ems for c/o pt pulled out trach; pt in nad

## 2020-12-14 NOTE — Progress Notes (Addendum)
Patient transferred to 1C in stable condition, VSS, breathing comfortably on TC 5L/28%. No c/o pain or discomfort. Oriented to room and call bell, education on fall/safety precautions and all questions/concerns addressed. PEG dressing changed, awaiting TF delivery for feeds. Will continue with POC.   Ambulated to bathroom with walker, gait unbalanced. Re-educated regarding fall precautions. Yellow socks, arm band, and bed alarm in place.

## 2020-12-14 NOTE — Progress Notes (Signed)
CSW spoke with RN who informed CSW that patient is from [facility]. Patient has an history of taking out her trach. CSW left a message with patients daughter.

## 2020-12-14 NOTE — Anesthesia Procedure Notes (Signed)
Procedure Name: Intubation Date/Time: 12/14/2020 4:06 AM Performed by: Sherrine Maples, CRNA Pre-anesthesia Checklist: Patient identified, Patient being monitored, Timeout performed, Emergency Drugs available and Suction available Patient Re-evaluated:Patient Re-evaluated prior to induction Oxygen Delivery Method: Circle system utilized Preoxygenation: Pre-oxygenation with 100% oxygen Induction Type: IV induction Grade View: Grade II Tube type: Oral Tube size: 6.0 mm Number of attempts: 1 Airway Equipment and Method: Stylet and Video-laryngoscopy Placement Confirmation: ETT inserted through vocal cords under direct vision, positive ETCO2 and breath sounds checked- equal and bilateral Secured at: 21 cm Tube secured with: Tape Dental Injury: Teeth and Oropharynx as per pre-operative assessment

## 2020-12-14 NOTE — Consult Note (Signed)
..Chazmin, Gana PJ:[id number] Sep 18, 1955 Flora Lipps, MD  Reason for Consult: Acute respiratory distress  HPI: Patient is a 65 y.o. female with history of subdural hematoma s/p tracheostomy tube in [facility] 01/2020.  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 Constance Holster in 09/2020.  Presented with dislodged tracheostomy tube to our ER in respiratory distress.  Attempts by ER physicians and respiratory therapy at replacement were unsuccessful.  Patient has been seen in ER for similar issues on 9/12, 9/9, 08/30, and today for similar issues.  Initially had size 6 shiley in place that has since been downsized to size 4 given stenosis of tract.  Patient report difficulty breathing and ROS/HPI is limited due to patient's acute respiratory distress.  Allergies: No Known Allergies  ROS: Review of systems normal other than 12 systems except per HPI.  PMH:  Past Medical History:  Diagnosis Date   Acute respiratory failure ([place])    Hypertension    Prosthetic eye globe    Ruptured aneurysm of artery (HCC)    Seizures (HCC)    Status post insertion of percutaneous endoscopic gastrostomy (PEG) tube (HCC)    Subdural hematoma (HCC)    Tracheostomy dependent ([place])    Tracheostomy present ([place])     FH: History reviewed. No pertinent family history.  SH:  Social History   Socioeconomic History   Marital status: Single    Spouse name: Not on file   Number of children: Not on file   Years of education: Not on file   Highest education level: Not on file  Occupational History   Not on file  Tobacco Use   Smoking status: Former   Smokeless tobacco: Never  Vaping Use   Vaping Use: Never used  Substance and Sexual Activity   Alcohol use: Not Currently   Drug use: Not Currently   Sexual activity: Not on file  Other Topics Concern   Not on file  Social History Narrative   Not on file   Social Determinants of Health   Financial Resource Strain: Not on file  Food  Insecurity: Not on file  Transportation Needs: Not on file  Physical Activity: Not on file  Stress: Not on file  Social Connections: Not on file  Intimate Partner Violence: Not on file    PSH:  Past Surgical History:  Procedure Laterality Date   Georganna Skeans N/A 03/18/2020   Procedure: Haskell Flirt;  Surgeon: [name], MD;  Location: [place];  Service: Neurosurgery;  Laterality: N/A;   CRANIOTOMY Left 01/30/2020   Procedure: LEFT FAR LATERAL CRANIOTOMY FOR ANEURYSM CLIPPING;  Surgeon: [name], MD;  Location: [place];  Service: Neurosurgery;  Laterality: Left;   DIRECT LARYNGOSCOPY N/A 02/29/2020   Procedure: DIRECT LARYNGOSCOPY;  Surgeon: [name], MD;  Location: [place];  Service: ENT;  Laterality: N/A;   DIRECT LARYNGOSCOPY N/A 10/03/2020   Procedure: DIRECT LARYNGOSCOPY w/esophagascopy;  Surgeon: [name], MD;  Location: [place];  Service: ENT;  Laterality: N/A;   IR ANGIO INTRA EXTRACRAN SEL INTERNAL CAROTID BILAT MOD SED  01/30/2020   IR ANGIO VERTEBRAL SEL VERTEBRAL UNI L MOD SED  01/30/2020   IR CM INJ ANY COLONIC TUBE W/FLUORO  07/03/2020   IR GASTROSTOMY TUBE MOD SED  02/22/2020   IR [place] GASTRO/COLONIC TUBE PERCUT W/FLUORO  07/03/2020   LAPAROSCOPIC REVISION VENTRICULAR-PERITONEAL (V-P) SHUNT N/A 03/18/2020   Procedure: LAPAROSCOPIC REVISION VENTRICULAR-PERITONEAL (V-P) SHUNT;  Surgeon: [name], MD;  Location: [place];  Service: General;  Laterality: N/A;   RADIOLOGY WITH ANESTHESIA N/A 01/30/2020   Procedure: IR WITH ANESTHESIA;  Surgeon: [name], MD;  Location: [place];  Service: Radiology;  Laterality: N/A;   THYROIDECTOMY N/A 02/08/2020   Procedure: THYROIDECTOMY;  Surgeon: [name], MD;  Location: [place];  Service: ENT;  Laterality: N/A;   TRACHEOSTOMY TUBE PLACEMENT N/A 02/08/2020   Procedure: TRACHEOSTOMY;  Surgeon: [name], MD;  Location: [place];  Service: ENT;  Laterality: N/A;   TRACHEOSTOMY TUBE PLACEMENT N/A 02/29/2020   Procedure:  TRACHEOSTOMY EXCHANGE;  Surgeon: [name], MD;  Location: [place];  Service: ENT;  Laterality: N/A;   VENTRICULOPERITONEAL SHUNT N/A 03/18/2020   Procedure: RIGHT SHUNT INSERTION VENTRICULAR-PERITONEAL/ BURR HOLE Evacuation of Subdural Hematoma;  Surgeon: [name], MD;  Location: [place];  Service: Neurosurgery;  Laterality: N/A;    Physical  Exam:  GEN-  Acute distress but able to respond to questions NEURO- CN 2-12 grossly intact and symmetric. EARS-  external ears clear NOSE- clear anteriorly OC/OP- dry mucus membranes, no masses or lesion NECK-  thin, tracheocutaneous fistual with no air movement through it RESP-  tachypnic with tripoding and use of accessory muscles.  Biphasic stridor CARD-  tachycardic  Procedure:  Trans-nasal flexible laryngoscopy:  After verbal consent was obtained, the distal chip disposable laryngoscope was placed in the patient's right nasal cavity.  No anesthesia was used due to acute respiratory distress.  This demonstrated pooling of secretions.  Bilateral true vocal folds were paralyzed and media position with no mobility.   A/P: Acute respiratory distress with tracheostomy dependence.  Plan is to proceed to OR for emergent intubation.  Unclear if able to replace tracheostomy at that time or if will need separate procedure in future.   Carloyn Manner 12/14/2020 4:39 AM

## 2020-12-14 NOTE — Progress Notes (Signed)
Patient seen for difficulty breathing due to "trach out".  Patient on room air with loud airway noises. Attempted to place size 4 cuffless trach back into airway with assistance of bougie catheter.  Could pass catheter however much resistance noted with trach. Lurline Idol would not advance through opening. Dr Leonides Schanz at bedside.  Placed patient on trach collar 100%. Able to suction patient x 3 for moderated cloudy thick secretions. Saturation remained low so non rebreather mask place over patients face.  O2 sats 100%.  ENT physician decided to take patient to OR for airway management.

## 2020-12-14 NOTE — Progress Notes (Signed)
PHARMACY CONSULT NOTE - FOLLOW UP  Pharmacy Consult for Electrolyte Monitoring and Replacement   Recent Labs: Potassium (mmol/L)  Date Value  12/14/2020 4.2   Magnesium (mg/dL)  Date Value  12/14/2020 2.3   Calcium (mg/dL)  Date Value  12/14/2020 9.4   Albumin (g/dL)  Date Value  12/05/2020 4.5   Phosphorus (mg/dL)  Date Value  12/14/2020 3.6   Sodium (mmol/L)  Date Value  12/14/2020 142     Assessment: 65 year old female presenting at [facility] ED on 12/14/2020 from [place] via EMS after patient called out to staff was found in her room a little after midnight in respiratory distress with her tracheostomy on the floor.  On free water 100 ml q4H.   Goal of Therapy:  Electrolytes WNL   Plan:  No replacement needed at this time.  F/u with AM labs.   Oswald Hillock ,PharmD Clinical Pharmacist 12/14/2020 1:17 PM

## 2020-12-14 NOTE — Transfer of Care (Signed)
Immediate Anesthesia Transfer of Care Note  Patient: Stacy Moran  Procedure(s) Performed: [place] STANDBY  Patient Location: PACU and ICU  Anesthesia Type:General  Level of Consciousness: drowsy and patient cooperative  Airway & Oxygen Therapy: Patient Spontanous Breathing and Patient connected to tracheostomy mask oxygen  Post-op Assessment: Report given to RN and Post -op Vital signs reviewed and stable  Post vital signs: Reviewed and stable  Last Vitals:  Vitals Value Taken Time  BP 119/98 0449  Temp [id number]  Pulse 91 0449  Resp 18 0449  SpO2 100 0449    Last Pain:  Vitals:   12/14/20 0231  TempSrc:   PainSc: 0-No pain         Complications: No notable events documented.

## 2020-12-14 NOTE — ED Notes (Signed)
Pt taken to OR.

## 2020-12-14 NOTE — Anesthesia Preprocedure Evaluation (Addendum)
Anesthesia Evaluation  Patient identified by MRN, date of birth, ID band  Reviewed: Unable to perform ROS - Chart review only  Airway Mallampati: III  TM Distance: >3 FB Neck ROM: full    Dental   Pulmonary former smoker,    + rhonchi  + decreased breath sounds      Cardiovascular hypertension,  Rate:Tachycardia     Neuro/Psych Seizures -,  Anxiety    GI/Hepatic   Endo/Other  Hypothyroidism   Renal/GU      Musculoskeletal   Abdominal   Peds  Hematology  (+) Blood dyscrasia, anemia ,   Anesthesia Other Findings H/o subdural hematoma 11/21-now, with trach--in ED several times over last month because trach fell out or was misplaced. Today, presents in respiratory distress, stridor, with no trach. ENT said that he would be bedside as intubation was attempted. ENT could not assess patient in ED due to distress.   Reproductive/Obstetrics                            Anesthesia Physical Anesthesia Plan  ASA: 3 and emergent  Anesthesia Plan: Regional   Post-op Pain Management:    Induction:   PONV Risk Score and Plan:   Airway Management Planned: Oral ETT and Video Laryngoscope Planned  Additional Equipment:   Intra-op Plan:   Post-operative Plan:   Informed Consent:   Plan Discussed with: CRNA  Anesthesia Plan Comments: (Plan slightlysedated intubation with patient spontaneously breathing with ENT present for emergent trach if needed)        Anesthesia Quick Evaluation

## 2020-12-14 NOTE — Op Note (Signed)
..  12/14/2020 4:18 AM  Nyoka Cowden [id number]  Pre-Op Dx: Acute respiratory distress, bilateral true vocal fold paralysis, previous tracheostomy tube dependence  Post-Op Dx:  same  Proc:  1)  Emergency intubation by Anesthesia   2)  Tracheostomy tube placement through existing fistula by dilation  Surg:  Jeannie Fend Dorcus Riga  Anes:  GOT  EBL:  <68m  Comp:  none  Indications:  65y.o. female with history of tracheostomy tube dependence presented in respiratory distress to our ER when tracheostomy tube was found to be dislodged.  Patient had trach placed 11/21 by Dr. RConstance Holsterin GTea  On repeat endoscopy in 7/22 by Dr. RConstance Holsterpatient was found to have little cord mobility.  Patient had desaturated to 49% prior to my arrival in ER.  Attempts by ER and respiratory therapy were unsuccessful to replace.  Trans-nasal flexible laryngoscopy was performed in ER that showed pooling of secretions and bilateral TVF immobility in median position.  Findings:  Patient intubated by anesthesia with McGraff laryngoscope and size 6 ETT with mild resistance through vocal folds.  After intubation, bubbling through tracheo-cutaneous fistula seen and size 4 cuffed shiley placed through fistula with moderate resistance and cuff inflated.  Procedure:  The patient was brought from the intensive care unit to the operating room in respiratory distress and tripoding position.  Anesthesia peformed LTA transorally with lidocaine and then the patient was intubated in the normal fashion with McGraff laryngoscope.  After intubation, the patient's saturation was 100%.  Bubbling was noted coming from the previous tracheo-cutaneous fistula.  This was evaluated and a small tract was noted and dilated with pediatric bougie.  At this time a 4.0 cuffed Shiley tracheostomy tube was placed through the tract and balloon inflated without difficult.  Ventilation was continued without difficulty.  Care of the patient was transferred  to Anesthesia were patient was taken to ICU in good condition.   Willadene Mounsey  4:18 AM 12/14/2020

## 2020-12-14 NOTE — Progress Notes (Signed)
CSW spoke with patients daughter who told CSW that [facility] did not call her to let her know her mother was taking to the hospital. [name] stated this is the second time this has happened. [name] stated her mother spent a month at [facility] because they could not find placement for her. [name] stated there were not many facilities willing to take a trach patient. [name] stated her mother has been at [facility] for the past 2 months. [name] stated her mother is not happy about being there and does not believe they are suctioning her enough. CSW encouraged [name] to speak with administration about her mother's care at [facility].

## 2020-12-14 NOTE — H&P (Addendum)
NAME:  Ahnesti Sabatelli, MRN:  [id number], DOB:  1955-06-17, LOS: 0 ADMISSION DATE:  12/14/2020, CONSULTATION DATE:  12/14/20 REFERRING MD:  Dr. Leonides Schanz, CHIEF COMPLAINT:  Respiratory Distress   History of Present Illness:  65 year old female presenting at [facility] ED on 12/14/2020 from [place] via EMS after patient called out to staff was found in her room a little after midnight in respiratory distress with her tracheostomy on the floor.  Of note this is her 5th visit to [facility] ED due to tracheostomy complications since August 2022.  She initially had a #6 Shiley which recently had to be replaced with a #4 Shiley.  Other complications include obstruction requiring suctioning and replacement.  Per EDP handoff report and documentation patient is not ventilator dependent but is on oxygen at night. ED course: Initial vitals: Afebrile at 98.5, RR 18, tachycardic at 119, hypertensive at 185/104 with SPO2 98% on room air.  Shortly after arrival SPO2 dropped into the 40s per EDP handoff report and patient was placed on nonrebreather with improvement in SPO2 to 100%. Significant labs: Mild hypokalemia at 3.4, hyperglycemia at 222, mild leukocytosis at 10.6, remainder of CBC and chemistry values WNL CXR 12/14/2020: Redemonstrated ventriculoperitoneal shunt.  Lower lung volumes with bibasilar atelectasis no other focal pulmonary opacity.  RT was unable to replace tracheostomy through existing stoma and ENT was emergently called bedside to replace tracheostomy. Dr. Pryor Ochoa with ENT was unable to replace tracheostomy and patient was taken emergently to OR for intubation. After sedation and endotracheal intubation, bubbles were visible from the previous tracheo-cutaneous fistula and Dr. Pryor Ochoa was successful in placing a #4 cuffed Shiley tracheostomy tube. PCCM was consulted for admission and patient was transported to ICU for mechanical ventilation support.  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 Admitted to ICU requiring mechanical ventilatory support.  Interim History / Subjective:  Patient resting comfortably in bed on mechanical ventilatory support. No c/o pain or SOB at this time.  Objective   Blood pressure (!) 185/104, pulse (!) 119, temperature 98.5 F (36.9 C), temperature source Oral, resp. rate 18, height '5\' 6"'$  (1.676 m), weight 58.5 kg, SpO2 98 %.       No intake or output data in the 24 hours ending 12/14/20 0401 Filed Weights   12/14/20 0231  Weight: 58.5 kg    Examination: General: Adult female, acutely ill, lying in bed intubated & sedated requiring mechanical ventilation, NAD HEENT: MM pink/moist, anicteric, atraumatic, neck supple Neuro: A&O x 3, able to follow commands, PERRL +3, MAE CV: s1s2 RRR, NSR on monitor, no r/m/g Pulm: Regular, non labored on PRVC 28%/ PEEP 5 , breath sounds coarse-BUL & diminished-BLL GI: soft, rounded, PEG tube in place, non tender, bs x 4 Skin: limited exam- no rashes/lesions noted Extremities: warm/dry, pulses + 2 R/P, no edema noted  Resolved Hospital Problem list     Assessment & Plan:  Acute Hypoxic Respiratory Failure secondary to tracheostomy dislodgement in the setting of vocal cord paralysis  PMHx: Vocal cord paralysis with tracheostomy dependence - Ventilator settings: PRVC  8 mL/kg,  28% FiO2, 5 PEEP, continue ventilator  support & lung protective strategies - Patient alert and responsive requiring minimal support-will  attempt to transition to Trach collar - Wean PEEP & FiO2 as tolerated, maintain SpO2 > 90% - Head of bed elevated 30 degrees, VAP protocol in place - Intermittent chest x-ray & ABG PRN - Ensure adequate pulmonary hygiene  - Bronchodilators PRN  Seizure disorder VP shunt PMHx: SAH, subdural hematoma, recurrent hydrocephalus - continue home keppra 750 mg BID & Seroquel BID - Neuro checks per ICU routine - Fall & seizure precautions - supportive care  Hypertension - continue home amlodipine - continuous cardiac monitoring  Hypothyroidism - continue home synthroid  Pre- Diabetes Mellitus Hemoglobin A1C: pending - Monitor CBG Q 4 hours - SSI moderate dosing - target range while in ICU: 140-180 - follow ICU hyper/hypo-glycemia protocol - Adult tube feeding protocol initiated with outpatient Osmolite 1.2 & free water flush re-ordered  Best Practice (right click and "Reselect all SmartList Selections" daily)  Diet/type: tubefeeds and NPO w/ meds via tube DVT prophylaxis: LMWH GI prophylaxis: PPI Lines: N/A Foley:  N/A Code Status:  full code Last date of multidisciplinary goals of care discussion [12/14/20]  Labs   CBC: Recent Labs  Lab 12/14/20 0225  WBC 10.6*  NEUTROABS 5.5  HGB 13.3  HCT 42.3  MCV 83.6  PLT 123456    Basic Metabolic Panel: Recent Labs  Lab 12/14/20 0225  NA 142  K 3.4*  CL 102  CO2 31  GLUCOSE 222*  BUN 23  CREATININE 0.66  CALCIUM 9.8   GFR: Estimated Creatinine Clearance: 66.5 mL/min (by C-G formula based on SCr of 0.66 mg/dL). Recent Labs  Lab 12/14/20 0225  WBC 10.6*    Liver Function Tests: No results for input(s): AST, ALT, ALKPHOS, BILITOT, PROT, ALBUMIN in the last 168 hours. No results for input(s): LIPASE, AMYLASE in the last 168 hours. No results for input(s): AMMONIA in the last 168 hours.  ABG    Component Value Date/Time   PHART 7.444 02/06/2020 0843   PCO2ART 42.8 02/06/2020 0843   PO2ART 157 (H) 02/06/2020 0843    HCO3 29.2 (H) 02/06/2020 0843   TCO2 32 08/26/2020 1115   ACIDBASEDEF 4.0 (H) 01/31/2020 0113   O2SAT 99.0 02/06/2020 0843     Coagulation Profile: Recent Labs  Lab 12/14/20 0340  INR 1.0    Cardiac Enzymes: No results for input(s): CKTOTAL, CKMB, CKMBINDEX, TROPONINI in the last 168 hours.  HbA1C: Hgb A1c MFr Bld  Date/Time Value Ref Range Status  04/22/2020 03:10 PM 5.8 (H) 4.8 - 5.6 % Final    Comment:    (NOTE) Pre diabetes:          5.7%-6.4%  Diabetes:              >6.4%  Glycemic control for   <7.0% adults with diabetes   01/30/2020 08:59 AM 6.2 (H) 4.8 - 5.6 % Final    Comment:    (NOTE) Pre diabetes:          5.7%-6.4%  Diabetes:              >6.4%  Glycemic control for   <7.0% adults with diabetes     CBG: No results for input(s): GLUCAP in the last 168 hours.  Review of Systems: Positives in BOLD  Gen: Denies fever, chills, weight change, fatigue, night sweats HEENT: Denies blurred vision, double vision, hearing loss, tinnitus, sinus congestion, rhinorrhea, sore throat, neck stiffness, dysphagia PULM: Denies shortness of breath- on  arrival to ED- no complaints currently, cough, sputum production, hemoptysis, wheezing CV: Denies chest pain, edema, orthopnea, paroxysmal nocturnal dyspnea, palpitations GI: Denies abdominal pain, nausea, vomiting, diarrhea, hematochezia, melena, constipation, change in bowel habits GU: Denies dysuria, hematuria, polyuria, oliguria, urethral discharge Endocrine: Denies hot or cold intolerance, polyuria, polyphagia or appetite change Derm: Denies rash, dry skin, scaling or peeling skin change Heme: Denies easy bruising, bleeding, bleeding gums Neuro: Denies headache, numbness, weakness, slurred speech, loss of memory or consciousness  Past Medical History:  She,  has a past medical history of Acute respiratory failure ([place]), Hypertension, Prosthetic eye globe, Ruptured aneurysm of artery ([place]), Seizures ([place]), Status  post insertion of percutaneous endoscopic gastrostomy (PEG) tube ([place]), Subdural hematoma ([place]), Tracheostomy dependent ([place]), and Tracheostomy present ([place]).   Surgical History:   Past Surgical History:  Procedure Laterality Date   BURR HOLE N/A 03/18/2020   Procedure: Haskell Flirt;  Surgeon: [name], MD;  Location: [place];  Service: Neurosurgery;  Laterality: N/A;   CRANIOTOMY Left 01/30/2020   Procedure: LEFT FAR LATERAL CRANIOTOMY FOR ANEURYSM CLIPPING;  Surgeon: [name], MD;  Location: [place];  Service: Neurosurgery;  Laterality: Left;   DIRECT LARYNGOSCOPY N/A 02/29/2020   Procedure: DIRECT LARYNGOSCOPY;  Surgeon: [name], MD;  Location: [place];  Service: ENT;  Laterality: N/A;   DIRECT LARYNGOSCOPY N/A 10/03/2020   Procedure: DIRECT LARYNGOSCOPY w/esophagascopy;  Surgeon: [name], MD;  Location: [place];  Service: ENT;  Laterality: N/A;   IR ANGIO INTRA EXTRACRAN SEL INTERNAL CAROTID BILAT MOD SED  01/30/2020   IR ANGIO VERTEBRAL SEL VERTEBRAL UNI L MOD SED  01/30/2020   IR CM INJ ANY COLONIC TUBE W/FLUORO  07/03/2020   IR GASTROSTOMY TUBE MOD SED  02/22/2020   IR [place] GASTRO/COLONIC TUBE PERCUT W/FLUORO  07/03/2020   LAPAROSCOPIC REVISION VENTRICULAR-PERITONEAL (V-P) SHUNT N/A 03/18/2020   Procedure: LAPAROSCOPIC REVISION VENTRICULAR-PERITONEAL (V-P) SHUNT;  Surgeon: [name], MD;  Location: [place];  Service: General;  Laterality: N/A;   RADIOLOGY WITH ANESTHESIA N/A 01/30/2020   Procedure: IR WITH ANESTHESIA;  Surgeon: [name], MD;  Location: [place];  Service: Radiology;  Laterality: N/A;   THYROIDECTOMY N/A 02/08/2020   Procedure: THYROIDECTOMY;  Surgeon: [name], MD;  Location: [place];  Service: ENT;  Laterality: N/A;   TRACHEOSTOMY TUBE PLACEMENT N/A 02/08/2020   Procedure: TRACHEOSTOMY;  Surgeon: [name], MD;  Location: [place];  Service: ENT;  Laterality: N/A;   TRACHEOSTOMY TUBE PLACEMENT N/A 02/29/2020   Procedure: TRACHEOSTOMY  EXCHANGE;  Surgeon: [name], MD;  Location: [place];  Service: ENT;  Laterality: N/A;   VENTRICULOPERITONEAL SHUNT N/A 03/18/2020   Procedure: RIGHT SHUNT INSERTION VENTRICULAR-PERITONEAL/ BURR HOLE Evacuation of Subdural Hematoma;  Surgeon: [name], MD;  Location: [place];  Service: Neurosurgery;  Laterality: N/A;     Social History:   reports that she has quit smoking. She has never used smokeless tobacco. She reports that she does not currently use alcohol. She reports that she does not currently use drugs.   Family History:  Her family history is not on file.   Allergies No Known Allergies   Home Medications  Prior to Admission medications   Medication Sig Start Date End Date Taking? Authorizing Provider  amLODipine (NORVASC) 10 MG tablet PLACE 1 TABLET (10 MG TOTAL) INTO FEEDING TUBE DAILY. 08/10/20 08/10/21 Yes [name], MD  amoxicillin-clavulanate (AUGMENTIN) 250-62.5 MG/5ML suspension Take 17.5 mLs by mouth in the morning and at bedtime.  09/10-09/17 12/06/20  Yes [provider]  chlorhexidine (PERIDEX) 0.12 % solution 15 mLs by Mouth Rinse route 2 (two) times daily. 10/07/20  Yes Welborn, Ryan, DO  levETIRAcetam (KEPPRA) 100 MG/ML solution Place 7.5 mLs (750 mg total) into feeding tube 2 (two) times daily. 10/07/20  Yes [name], DO  levothyroxine (SYNTHROID) 100 MCG tablet Place 1 tablet (100 mcg total) into feeding tube daily at 6 (six) AM. 07/16/20  Yes [name], MD  Nutritional Supplements (FEEDING SUPPLEMENT, OSMOLITE 1.2 CAL,) LIQD Place 355 mLs into feeding tube 4 (four) times daily -  with meals and at bedtime. 05/20/20  Yes Angiulli, [name], PA-C  Nutritional Supplements (FEEDING SUPPLEMENT, PROSOURCE TF,) liquid Place 45 mLs into feeding tube 2 (two) times daily. 05/20/20  Yes Angiulli, [name], PA-C  polyethylene glycol (MIRALAX / GLYCOLAX) 17 g packet Place 17 g into feeding tube 2 (two) times daily. 10/07/20  Yes Welborn, Ryan, DO   Probiotic NICU (GERBER SOOTHE) LIQD Take 5 drops by mouth daily at 8 pm for 10 days. 12/05/20 12/15/20 Yes Merlyn Lot, MD  QUEtiapine (SEROQUEL) 50 MG tablet PLACE 1 TABLET (50 MG TOTAL) INTO FEEDING TUBE 2 (TWO) TIMES DAILY. 07/29/20 07/29/21 Yes Jamse Arn, MD  acetaminophen (TYLENOL) 325 MG tablet Place 2 tablets (650 mg total) into feeding tube every 6 (six) hours as needed for mild pain. 05/20/20   Angiulli, [name], PA-C  senna (SENOKOT) 8.6 MG TABS tablet Place 1 tablet (8.6 mg total) into feeding tube daily. 10/08/20   [name], DO  Water For Irrigation, Sterile (FREE WATER) SOLN Place 200 mLs into feeding tube every 6 (six) hours. 05/20/20   Cathlyn Parsons, PA-C     Critical care time: 47 minutes       Domingo Pulse [place], AGACNP-BC Acute Care Nurse Practitioner [facility]   [phone] / [phone] Please see Amion for pager details.

## 2020-12-14 NOTE — Anesthesia Postprocedure Evaluation (Signed)
Anesthesia Post Note  Patient: Karielle Davidow  Procedure(s) Performed: [place] STANDBY  Patient location during evaluation: ICU Anesthesia Type: MAC and General Level of consciousness: awake Pain management: satisfactory to patient Vital Signs Assessment: post-procedure vital signs reviewed and stable Respiratory status: spontaneous breathing and patient connected to tracheostomy mask oxygen Cardiovascular status: blood pressure returned to baseline Anesthetic complications: no Comments: Patient stable upon leaving OR.    No notable events documented.   Last Vitals:  Vitals:   12/14/20 0230  BP: (!) 185/104  Pulse: (!) 119  Resp: 18  Temp: 36.9 C  SpO2: 98%    Last Pain:  Vitals:   12/14/20 0231  TempSrc:   PainSc: 0-No pain                 Hadiya Spoerl

## 2020-12-14 NOTE — Progress Notes (Addendum)
eLink Physician-Brief Progress Note Patient Name: Stacy Moran DOB: [birth year] MRN: [id number]   Date of Service  12/14/2020  HPI/Events of Note  Brief HPI: 65 yr old female SDH s/p trach in 01/2020. VC palsy on 09/2020. AHRF from recurring dislodged trach, now intubated in OR . Seen by ENT and converted vent via to trach back.   Notes, labs reviewed.  Data: K 3.4, wbc 10.6 Covid neg. Flu neg.   Camera: Resting. Discussed with RN. VS stable. In synchrony with Vent. Vt < 8 ml/ibw.   - continue lung protective ventilation - ENT on board - VTE and SUP. - CBG goals < 180.   eICU Interventions  - continue thyroid meds.     Intervention Category Major Interventions: Respiratory failure - evaluation and management Evaluation Type: New Patient Evaluation  Elmer Sow 12/14/2020, 5:21 AM

## 2020-12-14 NOTE — Progress Notes (Signed)
CSW received a call from patients daughter asking CSW to come down to the room to speak with them. CSW spoke with patient and her daughter. Patient stated she did not pull out her trach and that would be suicide if she did. Patient stated her brain is still there and she is not that far off. Patient stated she is not trying to kill herself and would not take out her trach. Patient stated it's okay to make mistakes but doing it 2 or 3 times then the facility needs to start owning up to their mistakes. Patient stated she believes the staff isn't doing something right with her trach. Patients daughter [name]) stated she is not pleased with the care her mother is receiving at [facility]. Patients daughter stated when her mother was in her care she never had any issues with that. CSW told patients daughter that her mother might have to discharge back to [place] and then she would have to find a new placement from there. CSW stated she is not sure if her mother would be able to stay in the hospital until another placement is found. Patients daughter was understanding and requested guidance or resources if her mother discharges back to [facility]. CSW also encouraged patients daughter to speak with staff at [facility] to find out what's going on. CSW told the daughter she would leave a message with the social worker coming in tomorrow.

## 2020-12-14 NOTE — Progress Notes (Addendum)
PHARMACY CONSULT NOTE - FOLLOW UP  Pharmacy Consult for Electrolyte Monitoring and Replacement   Recent Labs: Potassium (mmol/L)  Date Value  12/14/2020 3.4 (L)   Magnesium (mg/dL)  Date Value  12/14/2020 2.3   Calcium (mg/dL)  Date Value  12/14/2020 9.8   Albumin (g/dL)  Date Value  12/05/2020 4.5   Phosphorus (mg/dL)  Date Value  12/14/2020 3.6   Sodium (mmol/L)  Date Value  12/14/2020 142     Assessment: 65 year old female presenting at [facility] ED on 12/14/2020 from [place] via EMS after patient called out to staff was found in her room a little after midnight in respiratory distress with her tracheostomy on the floor.  On free water 100 ml q4H.   Goal of Therapy:  Electrolytes WNL   Plan:  BMP is in process.   Oswald Hillock ,PharmD Clinical Pharmacist 12/14/2020 10:47 AM

## 2020-12-14 NOTE — Progress Notes (Signed)
PHARMACY CONSULT NOTE - FOLLOW UP  Pharmacy Consult for Electrolyte Monitoring and Replacement   Recent Labs: Potassium (mmol/L)  Date Value  12/14/2020 3.4 (L)   Magnesium (mg/dL)  Date Value  09/22/2020 2.4   Calcium (mg/dL)  Date Value  12/14/2020 9.8   Albumin (g/dL)  Date Value  12/05/2020 4.5   Phosphorus (mg/dL)  Date Value  03/14/2020 4.0   Sodium (mmol/L)  Date Value  12/14/2020 142     Assessment: 9/18:  K @ 225 = 3.4  Goal of Therapy:  Electrolytes WNL   Plan:  Will order KCl 10 mEq IV X 2 and recheck electrolytes on 9/18 @ 1000.   Orene Desanctis ,PharmD Clinical Pharmacist 12/14/2020 4:35 AM

## 2020-12-14 NOTE — ED Provider Notes (Signed)
[facility] Emergency Department Provider Note  ____________________________________________   Event Date/Time   First MD Initiated Contact with Patient 12/14/20 0234     (approximate)  I have reviewed the triage vital signs and the nursing notes.   HISTORY  Chief Complaint pulled trach out    HPI Stacy Moran is a 65 y.o. 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  Patient has had multiple recent complications with her tracheostomy including obstruction requiring suctioning and replacement.  Initially had a #6 Shiley in place but this recently had to be replaced with a #4 Shiley.  Patient is not vent dependent.  Is on oxygen at night.        Past Medical History:  Diagnosis Date   Acute respiratory failure ([place])    Hypertension    Prosthetic eye globe    Ruptured aneurysm of artery (HCC)    Seizures (HCC)    Status post insertion of percutaneous endoscopic gastrostomy (PEG) tube (HCC)    Subdural hematoma (HCC)    Tracheostomy dependent ([place])    Tracheostomy present [facility])     Patient Active Problem List   Diagnosis Date Noted   Fall (on) (from) other stairs and steps, initial encounter 08/26/2020   Subdural hematoma ([place]) 07/31/2020   History of hemorrhagic cerebrovascular accident (CVA) with residual deficit 06/06/2020   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   S/P ventriculoperitoneal shunt 06/06/2020   Functional urinary incontinence 06/06/2020   Ineffective airway clearance    Thrombocytopenia (HCC)    Restlessness    Restlessness and agitation    Acute blood loss anemia    Essential hypertension    Seizure prophylaxis    Protein-calorie malnutrition, severe 04/24/2020   Dysphagia,  post-stroke    Benign essential HTN    Dysphagia    Status post tracheostomy ([place])    S/P percutaneous endoscopic gastrostomy (PEG) tube placement (HCC)    ICH (intracerebral hemorrhage) ([place]) 04/22/2020   Obstructive hydrocephalus ([place])    Seizure-like activity ([place]) 03/12/2020   PEG (percutaneous endoscopic gastrostomy) status ([place]) 03/08/2020   Hyperglycemia 03/08/2020   Acute respiratory failure (HCC)    Ventilator dependence ([place])    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/19/2020   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/19/2020   Tracheostomy status ([place]) 02/19/2020   Abdominal distention    Ruptured aneurysm of artery (HCC)    SAH (subarachnoid hemorrhage) (HCC)    Subarachnoid bleed (HCC)    Tachypnea    Prediabetes    Hypokalemia    Leukocytosis    Ileus, postoperative ([place])    Pressure injury of skin 02/13/2020   Ruptured cerebral aneurysm ([place]) 01/30/2020    Past Surgical History:  Procedure Laterality Date   BURR HOLE N/A 03/18/2020   Procedure: Haskell Flirt;  Surgeon: [name], MD;  Location: [place];  Service: Neurosurgery;  Laterality: N/A;   CRANIOTOMY Left 01/30/2020   Procedure: LEFT FAR LATERAL CRANIOTOMY FOR ANEURYSM CLIPPING;  Surgeon: [name], MD;  Location: [place];  Service: Neurosurgery;  Laterality: Left;   DIRECT LARYNGOSCOPY N/A 02/29/2020   Procedure: DIRECT LARYNGOSCOPY;  Surgeon: [name], MD;  Location: [place];  Service: ENT;  Laterality: N/A;   DIRECT LARYNGOSCOPY N/A 10/03/2020   Procedure: DIRECT LARYNGOSCOPY w/esophagascopy;  Surgeon: [name],  MD;  Location: [place];  Service: ENT;  Laterality: N/A;   IR ANGIO INTRA EXTRACRAN SEL INTERNAL CAROTID BILAT MOD SED  01/30/2020   IR ANGIO VERTEBRAL SEL VERTEBRAL UNI L MOD SED  01/30/2020   IR CM INJ ANY COLONIC TUBE W/FLUORO  07/03/2020   IR GASTROSTOMY TUBE MOD SED  02/22/2020   IR REPLC GASTRO/COLONIC TUBE PERCUT W/FLUORO  07/03/2020   LAPAROSCOPIC REVISION VENTRICULAR-PERITONEAL  (V-P) SHUNT N/A 03/18/2020   Procedure: LAPAROSCOPIC REVISION VENTRICULAR-PERITONEAL (V-P) SHUNT;  Surgeon: [name], MD;  Location: [place];  Service: General;  Laterality: N/A;   RADIOLOGY WITH ANESTHESIA N/A 01/30/2020   Procedure: IR WITH ANESTHESIA;  Surgeon: [name], MD;  Location: [place];  Service: Radiology;  Laterality: N/A;   THYROIDECTOMY N/A 02/08/2020   Procedure: THYROIDECTOMY;  Surgeon: [name], MD;  Location: [place];  Service: ENT;  Laterality: N/A;   TRACHEOSTOMY TUBE PLACEMENT N/A 02/08/2020   Procedure: TRACHEOSTOMY;  Surgeon: [name], MD;  Location: [place];  Service: ENT;  Laterality: N/A;   TRACHEOSTOMY TUBE PLACEMENT N/A 02/29/2020   Procedure: TRACHEOSTOMY EXCHANGE;  Surgeon: [name], MD;  Location: [place];  Service: ENT;  Laterality: N/A;   VENTRICULOPERITONEAL SHUNT N/A 03/18/2020   Procedure: RIGHT SHUNT INSERTION VENTRICULAR-PERITONEAL/ BURR HOLE Evacuation of Subdural Hematoma;  Surgeon: [name], MD;  Location: [place];  Service: Neurosurgery;  Laterality: N/A;    Prior to Admission medications   Medication Sig Start Date End Date Taking? Authorizing Provider  amLODipine (NORVASC) 10 MG tablet PLACE 1 TABLET (10 MG TOTAL) INTO FEEDING TUBE DAILY. 08/10/20 08/10/21 Yes [name], MD  amoxicillin-clavulanate (AUGMENTIN) 250-62.5 MG/5ML suspension Take 17.5 mLs by mouth in the morning and at bedtime. 09/10-09/17 12/06/20  Yes [provider]  chlorhexidine (PERIDEX) 0.12 % solution 15 mLs by Mouth Rinse route 2 (two) times daily. 10/07/20  Yes Welborn, Ryan, DO  levETIRAcetam (KEPPRA) 100 MG/ML solution Place 7.5 mLs (750 mg total) into feeding tube 2 (two) times daily. 10/07/20  Yes [name], DO  levothyroxine (SYNTHROID) 100 MCG tablet Place 1 tablet (100 mcg total) into feeding tube daily at 6 (six) AM. 07/16/20  Yes [name], MD  Nutritional Supplements (FEEDING SUPPLEMENT, OSMOLITE 1.2 CAL,) LIQD Place 355  mLs into feeding tube 4 (four) times daily -  with meals and at bedtime. 05/20/20  Yes Angiulli, [name], PA-C  Nutritional Supplements (FEEDING SUPPLEMENT, PROSOURCE TF,) liquid Place 45 mLs into feeding tube 2 (two) times daily. 05/20/20  Yes Angiulli, [name], PA-C  polyethylene glycol (MIRALAX / GLYCOLAX) 17 g packet Place 17 g into feeding tube 2 (two) times daily. 10/07/20  Yes Welborn, Ryan, DO  Probiotic NICU (GERBER SOOTHE) LIQD Take 5 drops by mouth daily at 8 pm for 10 days. 12/05/20 12/15/20 Yes Merlyn Lot, MD  QUEtiapine (SEROQUEL) 50 MG tablet PLACE 1 TABLET (50 MG TOTAL) INTO FEEDING TUBE 2 (TWO) TIMES DAILY. 07/29/20 07/29/21 Yes Jamse Arn, MD  acetaminophen (TYLENOL) 325 MG tablet Place 2 tablets (650 mg total) into feeding tube every 6 (six) hours as needed for mild pain. 05/20/20   Angiulli, [name], PA-C  senna (SENOKOT) 8.6 MG TABS tablet Place 1 tablet (8.6 mg total) into feeding tube daily. 10/08/20   [name], DO  Water For Irrigation, Sterile (FREE WATER) SOLN Place 200 mLs into feeding tube every 6 (six) hours. 05/20/20   Angiulli, [name], PA-C    Allergies Patient has no  known allergies.  History reviewed. No pertinent family history.  Social History Social History   Tobacco Use   Smoking status: Former   Smokeless tobacco: Never  Scientific laboratory technician Use: Never used  Substance Use Topics   Alcohol use: Not Currently   Drug use: Not Currently    Review of Systems Level 5 caveat secondary to respiratory distress  ____________________________________________   PHYSICAL EXAM:  VITAL SIGNS: ED Triage Vitals  Enc Vitals Group     BP 12/14/20 0230 (!) 185/104     Pulse Rate 12/14/20 0230 (!) 119     Resp 12/14/20 0230 18     Temp 12/14/20 0230 98.5 F (36.9 C)     Temp Source 12/14/20 0230 Oral     SpO2 12/14/20 0230 98 %     Weight 12/14/20 0231 129 lb (58.5 kg)     Height 12/14/20 0231 '5\' 6"'$  (1.676 m)     Head Circumference --       Peak Flow --      Pain Score 12/14/20 0231 0     Pain Loc --      Pain Edu? --      Excl. in GC? --    CONSTITUTIONAL: Alert and chronically ill-appearing.  In respiratory distress. HEAD: Normocephalic EYES: Conjunctivae clear, pupils appear equal, EOM appear intact ENT: normal nose; moist mucous membranes NECK: Supple, normal ROM; stoma appears slightly swollen without surrounding redness, bleeding, drainage CARD: Regular and tachycardic, S1 and S2 appreciated; no murmurs, no clicks, no rubs, no gallops RESP: Patient is tachypneic.  Difficult to appreciate lung sounds given upper airway transmitted noises.  Diminished at bases bilaterally.  I do hear air movement bilaterally. ABD/GI: Normal bowel sounds; non-distended; soft, non-tender, no rebound, no guarding, no peritoneal signs, no hepatosplenomegaly BACK: The back appears normal EXT: Normal ROM in all joints; no deformity noted, no edema; no cyanosis SKIN: Normal color for age and race; warm; no rash on exposed skin NEURO: Moves all extremities equally PSYCH: The patient's mood and manner are appropriate.  ____________________________________________   LABS (all labs ordered are listed, but only abnormal results are displayed)  Labs Reviewed  RESP PANEL BY RT-PCR (FLU A&B, COVID) ARPGX2  CBC WITH DIFFERENTIAL/PLATELET  BASIC METABOLIC PANEL  PROTIME-INR  TYPE AND SCREEN   ____________________________________________  EKG   ____________________________________________  RADIOLOGY I, Danyka Merlin, personally viewed and evaluated these images (plain radiographs) as part of my medical decision making, as well as reviewing the written report by the radiologist.  ED MD interpretation: No infiltrate.  No pneumothorax.  Official radiology report(s): DG Chest Portable 1 View  Result Date: 12/14/2020 CLINICAL DATA:  Respiratory distress EXAM: PORTABLE CHEST 1 VIEW COMPARISON:  12/08/2020 FINDINGS: Lower lung volumes with  bibasilar atelectasis. Previously noted tracheostomy tube is no longer seen. A CSF shunt tube overlies the right neck and hemithorax. Unchanged cardiac and mediastinal contours. No definite pleural effusion. No acute osseous abnormality. IMPRESSION: 1. Previously noted tracheostomy tube is no longer seen. Redemonstrated ventriculoperitoneal shunt. 2. Lower lung volumes with bibasilar atelectasis. No other focal pulmonary opacity. Electronically Signed   By: Merilyn Baba M.D.   On: 12/14/2020 03:44    ____________________________________________   PROCEDURES  Procedure(s) performed (including Critical Care):  Procedures  CRITICAL CARE Performed by: Cyril Mourning Baley Shands   Total critical care time: 65 minutes  Critical care time was exclusive of separately billable procedures and treating other patients.  Critical care was necessary to treat or  prevent imminent or life-threatening deterioration.  Critical care was time spent personally by me on the following activities: development of treatment plan with patient and/or surrogate as well as nursing, discussions with consultants, evaluation of patient's response to treatment, examination of patient, obtaining history from patient or surrogate, ordering and performing treatments and interventions, ordering and review of laboratory studies, ordering and review of radiographic studies, pulse oximetry and re-evaluation of patient's condition.  ____________________________________________   INITIAL IMPRESSION / ASSESSMENT AND PLAN / ED COURSE  As part of my medical decision making, I reviewed the following data within the [place] notes reviewed and incorporated, Labs reviewed , Old chart reviewed, Radiograph reviewed , Discussed with admitting physician , consulted ENT and critical care, and Notes from prior ED visits         Patient here with shortness of breath after her trach has come out at her nursing home.  She is  unable to tell me when this occurred.  We attempted to replace her trach at the bedside with a #4 Shiley without success.  Also attempted to pass a bougie which would only go in about an inch and a half.  Patient initially satting 100% on room air.  Contacted Dr. Pryor Ochoa with ENT who immediately came to the emergency department to see the patient.  Appreciate ENT help.  Patient subsequently became more stridulous and in more distress and had sats of 49% on 100% trach collar.  Attempted suctioning her airway x3 which brought sats to 72%.  Patient placed on nonrebreather which brought her sats to 100%.  ED PROGRESS  ENT at bedside performed bedside laryngoscopy which showed bilateral vocal cord paralysis.  He plans to take patient to the operating room for intubation with plans to revise her trach likely the beginning of next week.  Discussed with Venetia Night, NP with critical care for admission.  Chest x-ray obtained shows no pneumothorax, infiltrate.  3:47 AM  Pt's sats still 100% on NRB.  Patient taken to OR.  I reviewed all nursing notes and pertinent previous records as available.  I have reviewed and interpreted any EKGs, lab and urine results, imaging (as available).  ____________________________________________   FINAL CLINICAL IMPRESSION(S) / ED DIAGNOSES  Final diagnoses:  Respiratory distress  Acute respiratory failure with hypoxia ([place])  Tracheostomy malfunction [facility])     ED Discharge Orders     None       *Please note:  Bellagrace Sicat was evaluated in Emergency Department on 12/14/2020 for the symptoms described in the history of present illness. She was evaluated in the context of the global COVID-19 pandemic, which necessitated consideration that the patient might be at risk for infection with the SARS-CoV-2 virus that causes COVID-19. Institutional protocols and algorithms that pertain to the evaluation of patients at risk for COVID-19 are in a state of rapid  change based on information released by regulatory bodies including the CDC and federal and state organizations. These policies and algorithms were followed during the patient's care in the ED.  Some ED evaluations and interventions may be delayed as a result of limited staffing during and the pandemic.*   Note:  This document was prepared using Dragon voice recognition software and may include unintentional dictation errors.    Marcille Barman, Delice Bison, DO 12/14/20 [phone]

## 2020-12-15 ENCOUNTER — Encounter: Payer: Self-pay | Admitting: Otolaryngology

## 2020-12-15 DIAGNOSIS — J9601 Acute respiratory failure with hypoxia: Secondary | ICD-10-CM | POA: Diagnosis not present

## 2020-12-15 LAB — CBC WITH DIFFERENTIAL/PLATELET
Abs Immature Granulocytes: 0.01 10*3/uL (ref 0.00–0.07)
Basophils Absolute: 0 10*3/uL (ref 0.0–0.1)
Basophils Relative: 1 %
Eosinophils Absolute: 0.1 10*3/uL (ref 0.0–0.5)
Eosinophils Relative: 2 %
HCT: 35.3 % — ABNORMAL LOW (ref 36.0–46.0)
Hemoglobin: 11 g/dL — ABNORMAL LOW (ref 12.0–15.0)
Immature Granulocytes: 0 %
Lymphocytes Relative: 38 %
Lymphs Abs: 1.7 10*3/uL (ref 0.7–4.0)
MCH: 25.6 pg — ABNORMAL LOW (ref 26.0–34.0)
MCHC: 31.2 g/dL (ref 30.0–36.0)
MCV: 82.1 fL (ref 80.0–100.0)
Monocytes Absolute: 0.4 10*3/uL (ref 0.1–1.0)
Monocytes Relative: 9 %
Neutro Abs: 2.2 10*3/uL (ref 1.7–7.7)
Neutrophils Relative %: 50 %
Platelets: 141 10*3/uL — ABNORMAL LOW (ref 150–400)
RBC: 4.3 MIL/uL (ref 3.87–5.11)
RDW: 13.9 % (ref 11.5–15.5)
WBC: 4.4 10*3/uL (ref 4.0–10.5)
nRBC: 0 % (ref 0.0–0.2)

## 2020-12-15 LAB — BASIC METABOLIC PANEL
Anion gap: 4 — ABNORMAL LOW (ref 5–15)
BUN: 18 mg/dL (ref 8–23)
CO2: 30 mmol/L (ref 22–32)
Calcium: 9.2 mg/dL (ref 8.9–10.3)
Chloride: 106 mmol/L (ref 98–111)
Creatinine, Ser: 0.58 mg/dL (ref 0.44–1.00)
GFR, Estimated: >60 mL/min (ref >60)
Glucose, Bld: 129 mg/dL — ABNORMAL HIGH (ref 70–99)
Potassium: 3.7 mmol/L (ref 3.5–5.1)
Sodium: 140 mmol/L (ref 135–145)

## 2020-12-15 LAB — MAGNESIUM: Magnesium: 2.3 mg/dL (ref 1.7–2.4)

## 2020-12-15 LAB — MRSA NEXT GEN BY PCR, NASAL: MRSA by PCR Next Gen: NOT DETECTED

## 2020-12-15 LAB — GLUCOSE, CAPILLARY
Glucose-Capillary: 136 mg/dL — ABNORMAL HIGH (ref 70–99)
Glucose-Capillary: 106 mg/dL — ABNORMAL HIGH (ref 70–99)
Glucose-Capillary: 121 mg/dL — ABNORMAL HIGH (ref 70–99)
Glucose-Capillary: 78 mg/dL (ref 70–99)
Glucose-Capillary: 157 mg/dL — ABNORMAL HIGH (ref 70–99)

## 2020-12-15 LAB — PHOSPHORUS: Phosphorus: 3.4 mg/dL (ref 2.5–4.6)

## 2020-12-15 MED ORDER — FREE WATER
200.0000 mL | Status: DC
  Administered 2020-12-15 – 2020-12-31 (×95): 200 mL

## 2020-12-15 MED ORDER — OSMOLITE 1.2 CAL PO LIQD
237.0000 mL | Freq: Four times a day (QID) | ORAL | Status: DC
  Administered 2020-12-15 – 2020-12-16 (×4): 237 mL
  Administered 2020-12-16: 355 mL
  Administered 2020-12-16 (×2): 237 mL

## 2020-12-15 MED ORDER — PROSOURCE TF PO LIQD
45.0000 mL | Freq: Two times a day (BID) | ORAL | Status: DC
  Administered 2020-12-15 – 2020-12-31 (×33): 45 mL
  Filled 2020-12-15 (×34): qty 45

## 2020-12-15 NOTE — Progress Notes (Signed)
..12/15/2020 8:09 AM  Stacy Moran [id number]   Temp:  [97.8 F (36.6 C)-98.9 F (37.2 C)] 97.8 F (36.6 C) (09/19 0737) Pulse Rate:  [58-91] 71 (09/19 0737) Resp:  [11-26] 12 (09/19 0737) BP: (109-140)/(74-106) 110/81 (09/19 0737) SpO2:  [98 %-100 %] 100 % (09/19 0737) FiO2 (%):  [28 %] 28 % (09/19 0737) Weight:  [55.1 kg] 55.1 kg (09/19 0313),     Intake/Output Summary (Last 24 hours) at 12/15/2020 0809 Last data filed at 12/15/2020 0610 Gross per 24 hour  Intake 1706.33 ml  Output 450 ml  Net 1256.33 ml    Results for orders placed or performed during the hospital encounter of 12/14/20 (from the past 24 hour(s))  Glucose, capillary     Status: None   Collection Time: 12/14/20  8:20 AM  Result Value Ref Range   Glucose-Capillary 70 70 - 99 mg/dL  Basic metabolic panel     Status: Abnormal   Collection Time: 12/14/20 10:20 AM  Result Value Ref Range   Sodium 142 135 - 145 mmol/L   Potassium 4.2 3.5 - 5.1 mmol/L   Chloride 104 98 - 111 mmol/L   CO2 30 22 - 32 mmol/L   Glucose, Bld 113 (H) 70 - 99 mg/dL   BUN 20 8 - 23 mg/dL   Creatinine, Ser 0.67 0.44 - 1.00 mg/dL   Calcium 9.4 8.9 - 10.3 mg/dL   GFR, Estimated >60 >60 mL/min   Anion gap 8 5 - 15  Hemoglobin A1c     Status: Abnormal   Collection Time: 12/14/20 10:20 AM  Result Value Ref Range   Hgb A1c MFr Bld 6.2 (H) 4.8 - 5.6 %   Mean Plasma Glucose 131.24 mg/dL  Blood gas, venous     Status: Abnormal   Collection Time: 12/14/20 10:20 AM  Result Value Ref Range   pH, Ven 7.43 7.250 - 7.430   pCO2, Ven 51 44.0 - 60.0 mmHg   pO2, Ven 67.0 (H) 32.0 - 45.0 mmHg   Bicarbonate 33.9 (H) 20.0 - 28.0 mmol/L   Acid-Base Excess 8.2 (H) 0.0 - 2.0 mmol/L   O2 Saturation 93.5 %   Patient temperature 37.0    Collection site VENOUS    Sample type VENOUS   Glucose, capillary     Status: Abnormal   Collection Time: 12/14/20 12:37 PM  Result Value Ref Range   Glucose-Capillary 118 (H) 70 - 99 mg/dL  Glucose, capillary      Status: Abnormal   Collection Time: 12/14/20  4:49 PM  Result Value Ref Range   Glucose-Capillary 101 (H) 70 - 99 mg/dL  Glucose, capillary     Status: None   Collection Time: 12/14/20  8:07 PM  Result Value Ref Range   Glucose-Capillary 93 70 - 99 mg/dL  Glucose, capillary     Status: Abnormal   Collection Time: 12/14/20 10:24 PM  Result Value Ref Range   Glucose-Capillary 101 (H) 70 - 99 mg/dL  MRSA Next Gen by PCR, Nasal     Status: None   Collection Time: 12/14/20 11:52 PM   Specimen: Nasal Mucosa; Nasal Swab  Result Value Ref Range   MRSA by PCR Next Gen NOT DETECTED NOT DETECTED  Basic metabolic panel     Status: Abnormal   Collection Time: 12/15/20  4:30 AM  Result Value Ref Range   Sodium 140 135 - 145 mmol/L   Potassium 3.7 3.5 - 5.1 mmol/L   Chloride 106 98 - 111 mmol/L  CO2 30 22 - 32 mmol/L   Glucose, Bld 129 (H) 70 - 99 mg/dL   BUN 18 8 - 23 mg/dL   Creatinine, Ser 0.58 0.44 - 1.00 mg/dL   Calcium 9.2 8.9 - 10.3 mg/dL   GFR, Estimated >60 >60 mL/min   Anion gap 4 (L) 5 - 15  CBC with Differential/Platelet     Status: Abnormal   Collection Time: 12/15/20  4:30 AM  Result Value Ref Range   WBC 4.4 4.0 - 10.5 K/uL   RBC 4.30 3.87 - 5.11 MIL/uL   Hemoglobin 11.0 (L) 12.0 - 15.0 g/dL   HCT 35.3 (L) 36.0 - 46.0 %   MCV 82.1 80.0 - 100.0 fL   MCH 25.6 (L) 26.0 - 34.0 pg   MCHC 31.2 30.0 - 36.0 g/dL   RDW 13.9 11.5 - 15.5 %   Platelets 141 (L) 150 - 400 K/uL   nRBC 0.0 0.0 - 0.2 %   Neutrophils Relative % 50 %   Neutro Abs 2.2 1.7 - 7.7 K/uL   Lymphocytes Relative 38 %   Lymphs Abs 1.7 0.7 - 4.0 K/uL   Monocytes Relative 9 %   Monocytes Absolute 0.4 0.1 - 1.0 K/uL   Eosinophils Relative 2 %   Eosinophils Absolute 0.1 0.0 - 0.5 K/uL   Basophils Relative 1 %   Basophils Absolute 0.0 0.0 - 0.1 K/uL   Immature Granulocytes 0 %   Abs Immature Granulocytes 0.01 0.00 - 0.07 K/uL  Magnesium     Status: None   Collection Time: 12/15/20  4:30 AM  Result Value  Ref Range   Magnesium 2.3 1.7 - 2.4 mg/dL  Phosphorus     Status: None   Collection Time: 12/15/20  4:30 AM  Result Value Ref Range   Phosphorus 3.4 2.5 - 4.6 mg/dL  Glucose, capillary     Status: Abnormal   Collection Time: 12/15/20  5:24 AM  Result Value Ref Range   Glucose-Capillary 136 (H) 70 - 99 mg/dL  Glucose, capillary     Status: Abnormal   Collection Time: 12/15/20  7:58 AM  Result Value Ref Range   Glucose-Capillary 106 (H) 70 - 99 mg/dL    SUBJECTIVE:  No acute events overnight.  Patient denies breathing issue.  Reports she does not remember much of surgery yesterday morning.  OBJECTIVE:  GEN- NAD, sitting upright in bed, phonating ok NECK- size 4 shiley cuffed trach in place and patent and secure.  Cuff delfated PMV placed patient able to phonate well around cuff  IMPRESSION:  Bilateral TVF paralysis with respiratory distress s/p emergent intubation and replacement of tracheostomy tube  PLAN:  Will leave cuff trach in for now as patient able to phonate with it.  Bulk of cuff will limit easy ability to fall out.  Patient reports at outside care facility, when she would cough or bend over, trach would easily pop out.  Most likely due to patient's small neck circumference, the tie was not tight enough.  Will defer to social work and family regarding disposition.  Recommend trach change in 1 month.  Stacy Moran 12/15/2020, 8:09 AM

## 2020-12-15 NOTE — Progress Notes (Signed)
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  Patient was found in respiratory distress in her room with a tracheostomy tube on the floor.  This is her fifth visit to the [facility] ED due to tracheostomy complications in 1 month.  Patient is not ventilator dependent but on oxygen at night. Patient was significantly short of breath on arrival in the ED requiring nonrebreather with improvement in SPO2.  Respiratory therapist was unable to replace tracheostomy through existing stoma.  ENT was emergently called to replace tracheostomy. Patient was intubated and taken to the OR for placement of tracheostomy tube.  Patient was admitted in the ICU post procedure  for mechanical ventilation support. Patient was subsequently extubated next day, not requiring pressors, handed over to [facility]  Assessment & Plan:   Active Problems:   Acute respiratory failure (HCC)  Acute on chronic hypoxic respiratory failure sec. to tracheostomy dislodgment: Patient does have history of B/L vocal cord paralysis with tracheotomy dependence. Patient was found in respiratory distress with tracheostomy tube on the floor. Patient was significantly hypoxic on arrival requiring nonrebreather. RT was not successful to put tracheostomy tube through existing stoma. ENT was consulted,  Patient was intubated and underwent tracheostomy replacement in the OR. Patient was on mechanical ventilation in ICU, successfully extubated. Continue oxygen  supplementation through tracheostomy collar. Patient is alert and responsive, fully awake and oriented. Continue bronchodilators as needed. Social worker consult for disposition.  Seizure disorder: Continue Keppra 750 mg twice daily,  Continue Seroquel twice daily.  History of SAH, subdural hematoma, recurrent hydrocephalus: Patient does have chronic VP shunt, continue neurochecks. Continue fall and seizure precautions. Continue supportive care.  Essential hypertension: Continue amlodipine.  Hypothyroidism : Continue Synthroid   Diabetes mellitus: Continue sliding scale Continue hypoglycemia protocol.  S/p PEG tube: Continue tube feeding with Osmolite and free water flush  DVT prophylaxis: Lovenox Code Status: Full code Family Communication: No family at bedside Disposition Plan:    Status is: Inpatient  Remains inpatient appropriate because:Inpatient level of care appropriate due to severity of illness  Dispo: The patient is from: SNF              Anticipated d/c is to: SNF              Patient currently is not medically stable to d/c.   Difficult to place patient No  Consultants:  PCCM ENT  Procedures: Replacement of tracheostomy tube.  Antimicrobials:   Anti-infectives (From admission, onward)    None       Subjective: Patient was seen and examined at bedside.  Overnight events noted.   Patient reports feeling better, She is alert and awake,  fully cooperative.   It seems trach is working fine.  Patient seems very deconditioned.  Objective: Vitals:   12/15/20 0313 12/15/20 0527 12/15/20 0737 12/15/20 0813  BP:  118/81 110/81   Pulse:  (!) 58 71   Resp:  15 12   Temp:  97.8 F (36.6 C) 97.8 F (36.6 C)   TempSrc:  Oral Oral   SpO2:  100% 100% 100%  Weight: 55.1 kg  Height:        Intake/Output Summary (Last 24 hours) at 12/15/2020 1048 Last data filed at 12/15/2020 0610 Gross per 24 hour  Intake 1706.33 ml  Output 450 ml  Net 1256.33  ml   Filed Weights   12/14/20 0231 12/14/20 2214 12/15/20 0313  Weight: 58.5 kg 55.1 kg 55.1 kg    Examination:  General exam: Appears comfortable, deconditioned, thin built, not in any acute distress.Trach. collar noted. Respiratory system: Clear to auscultation bilaterally, no wheezing. Cardiovascular system: S1-S2 heard, regular rate and rhythm, no murmur. Gastrointestinal system: Abdomen is soft, nontender, nondistended, PEG tube noted, BS + Central nervous system: Alert and oriented x3, no focal neurological deficits. Extremities: No edema, no cyanosis, no clubbing. Skin: No rashes, lesions or ulcers Psychiatry: Judgement and insight appear normal. Mood & affect appropriate.     Data Reviewed: I have personally reviewed following labs and imaging studies  CBC: Recent Labs  Lab 12/14/20 0225 12/15/20 0430  WBC 10.6* 4.4  NEUTROABS 5.5 2.2  HGB 13.3 11.0*  HCT 42.3 35.3*  MCV 83.6 82.1  PLT 154 [id number]*   Basic Metabolic Panel: Recent Labs  Lab 12/14/20 0225 12/14/20 1020 12/15/20 0430  NA 142 142 140  K 3.4* 4.2 3.7  CL 102 104 106  CO2 '31 30 30  '$ GLUCOSE 222* 113* 129*  BUN '23 20 18  '$ CREATININE 0.66 0.67 0.58  CALCIUM 9.8 9.4 9.2  MG 2.3  --  2.3  PHOS 3.6  --  3.4   GFR: Estimated Creatinine Clearance: 62.6 mL/min (by C-G formula based on SCr of 0.58 mg/dL). Liver Function Tests: No results for input(s): AST, ALT, ALKPHOS, BILITOT, PROT, ALBUMIN in the last 168 hours. No results for input(s): LIPASE, AMYLASE in the last 168 hours. No results for input(s): AMMONIA in the last 168 hours. Coagulation Profile: Recent Labs  Lab 12/14/20 0340  INR 1.0   Cardiac Enzymes: No results for input(s): CKTOTAL, CKMB, CKMBINDEX, TROPONINI in the last 168 hours. BNP (last 3 results) No results for input(s): PROBNP in the last 8760 hours. HbA1C: Recent Labs    12/14/20 1020  HGBA1C 6.2*   CBG: Recent Labs  Lab 12/14/20 1649 12/14/20 2007 12/14/20 2224  12/15/20 0524 12/15/20 0758  GLUCAP 101* 93 101* 136* 106*   Lipid Profile: No results for input(s): CHOL, HDL, LDLCALC, TRIG, CHOLHDL, LDLDIRECT in the last 72 hours. Thyroid Function Tests: No results for input(s): TSH, T4TOTAL, FREET4, T3FREE, THYROIDAB in the last 72 hours. Anemia Panel: No results for input(s): VITAMINB12, FOLATE, FERRITIN, TIBC, IRON, RETICCTPCT in the last 72 hours. Sepsis Labs: No results for input(s): PROCALCITON, LATICACIDVEN in the last 168 hours.  Recent Results (from the past 240 hour(s))  Resp Panel by RT-PCR (Flu A&B, Covid) Nasopharyngeal Swab     Status: None   Collection Time: 12/14/20  2:25 AM   Specimen: Nasopharyngeal Swab; Nasopharyngeal(NP) swabs in vial transport medium  Result Value Ref Range Status   SARS Coronavirus 2 by RT PCR NEGATIVE NEGATIVE Final    Comment: (NOTE) SARS-CoV-2 target nucleic acids are NOT DETECTED.  The SARS-CoV-2 RNA is generally detectable in upper respiratory specimens during the acute phase of infection. The lowest concentration of SARS-CoV-2 viral copies this assay can detect is 138 copies/mL. A negative result does not preclude SARS-Cov-2 infection and should not be used as the sole basis for treatment or other patient management decisions. A negative result may occur with  improper specimen collection/handling, submission of specimen  other than nasopharyngeal swab, presence of viral mutation(s) within the areas targeted by this assay, and inadequate number of viral copies(<138 copies/mL). A negative result must be combined with clinical observations, patient history, and epidemiological information. The expected result is Negative.  Fact Sheet for Patients:  EntrepreneurPulse.com.au  Fact Sheet for Healthcare Providers:  IncredibleEmployment.be  This test is no t yet approved or cleared by the [place] FDA and  has been authorized for detection and/or diagnosis of  SARS-CoV-2 by FDA under an Emergency Use Authorization (EUA). This EUA will remain  in effect (meaning this test can be used) for the duration of the COVID-19 declaration under Section 564(b)(1) of the Act, 21 U.S.C.section 360bbb-3(b)(1), unless the authorization is terminated  or revoked sooner.       Influenza A by PCR NEGATIVE NEGATIVE Final   Influenza B by PCR NEGATIVE NEGATIVE Final    Comment: (NOTE) The Xpert Xpress SARS-CoV-2/FLU/RSV plus assay is intended as an aid in the diagnosis of influenza from Nasopharyngeal swab specimens and should not be used as a sole basis for treatment. Nasal washings and aspirates are unacceptable for Xpert Xpress SARS-CoV-2/FLU/RSV testing.  Fact Sheet for Patients: EntrepreneurPulse.com.au  Fact Sheet for Healthcare Providers: IncredibleEmployment.be  This test is not yet approved or cleared by the [place] FDA and has been authorized for detection and/or diagnosis of SARS-CoV-2 by FDA under an Emergency Use Authorization (EUA). This EUA will remain in effect (meaning this test can be used) for the duration of the COVID-19 declaration under Section 564(b)(1) of the Act, 21 U.S.C. section 360bbb-3(b)(1), unless the authorization is terminated or revoked.  Performed at [facility], [address] [place], [place] [address]   MRSA Next Gen by PCR, Nasal     Status: None   Collection Time: 12/14/20 11:52 PM   Specimen: Nasal Mucosa; Nasal Swab  Result Value Ref Range Status   MRSA by PCR Next Gen NOT DETECTED NOT DETECTED Final    Comment: (NOTE) The GeneXpert MRSA Assay (FDA approved for NASAL specimens only), is one component of a comprehensive MRSA colonization surveillance program. It is not intended to diagnose MRSA infection nor to guide or monitor treatment for MRSA infections. Test performance is not FDA approved in patients less than 4 years old. Performed at [facility], [address], [place], [place] [address]     Radiology Studies: DG Chest Portable 1 View  Result Date: 12/14/2020 CLINICAL DATA:  Respiratory distress EXAM: PORTABLE CHEST 1 VIEW COMPARISON:  12/08/2020 FINDINGS: Lower lung volumes with bibasilar atelectasis. Previously noted tracheostomy tube is no longer seen. A CSF shunt tube overlies the right neck and hemithorax. Unchanged cardiac and mediastinal contours. No definite pleural effusion. No acute osseous abnormality. IMPRESSION: 1. Previously noted tracheostomy tube is no longer seen. Redemonstrated ventriculoperitoneal shunt. 2. Lower lung volumes with bibasilar atelectasis. No other focal pulmonary opacity. Electronically Signed   By: Merilyn Baba M.D.   On: 12/14/2020 03:44    Scheduled Meds:  amLODipine  10 mg Per Tube Daily   Chlorhexidine Gluconate Cloth  6 each Topical Daily   enoxaparin (LOVENOX) injection  40 mg Subcutaneous Q24H   free water  100 mL Per Tube Q4H   insulin aspart  0-15 Units Subcutaneous Q4H   levETIRAcetam  750 mg Per Tube BID   levothyroxine  100 mcg Per Tube Q0600   pantoprazole (PROTONIX) IV  40 mg Intravenous QHS   polyethylene glycol  17 g Per Tube  BID   QUEtiapine  50 mg Per Tube BID   senna  1 tablet Per Tube Daily   Continuous Infusions:  feeding supplement (OSMOLITE 1.2 CAL) 50 mL/hr at 12/15/20 0610     LOS: 1 day    Time spent: 35 mins.    Shawna Clamp, MD [facility]   If 7PM-7AM, please contact night-coverage

## 2020-12-15 NOTE — Progress Notes (Signed)
Initial Nutrition Assessment  DOCUMENTATION CODES:  Severe malnutrition in context of chronic illness  INTERVENTION:  Initiate 355 ml (1.5 cartons) of Osmolite 1.2 QID via PEG - 45 ml Prosource TF BID via PEG - 200 ml free water every 4 hours via PEG   Complete regimen provides 1784 kcals, 100 grams protein, and 1964 ml free water daily, meeting 100% of estimated kcal and protein needs.  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  Patient was found in respiratory distress in her room with a tracheostomy tube on the floor.  This is her fifth visit to the [facility] ED due to tracheostomy complications in 1 month. 9/18 - Taken emergently to OR for mechanical intubation by ENT after tracheostomy malfunction. Admitted to ICU requiring mechanical ventilatory support  Current TF regimen: Osmolite 1.2 @ 50 ml/hr -Provides 1440 kcal, 67 grams protein, and 984 ml free water daily.  Patient would prefer bolus feedings - RD to switch pt to bolus feeds.   Pt did not provide much history due to exhaustion. RD suspicious of weight loss given that her regimen should meet her nutrition needs.  Per Epic, pt has lost 8 lbs (6.2%) in the past  week, which is significant and severe for the time frame.  Medications: reviewed; FWF 100 ml q4 hrs, Keppra BID, Synthroid, Protonix, miralax, Senakot  Labs: reviewed; CBG 93-136  NUTRITION - FOCUSED PHYSICAL EXAM: Flowsheet Row Most Recent Value  Orbital Region Mild depletion  Upper Arm Region Severe depletion  Thoracic and Lumbar Region Moderate depletion  Buccal Region Moderate depletion  Temple Region Moderate depletion  Clavicle Bone Region Moderate depletion  Clavicle and Acromion Bone Region Moderate depletion  Scapular Bone Region Mild depletion  Dorsal Hand Moderate depletion  Patellar Region Severe depletion  Anterior Thigh Region Severe depletion  Posterior Calf Region Severe depletion  Edema (RD Assessment) None  Hair Reviewed  Eyes Reviewed  Mouth Reviewed  Skin Reviewed  Nails Reviewed   Diet Order:   Diet Order             Diet NPO time specified  Diet effective now                  EDUCATION NEEDS:  Education needs have been addressed  Skin:  Skin Assessment: Reviewed RN Assessment  Last BM:  12/14/20 - Type 4, small  Height:  Ht Readings from Last 1 Encounters:  12/14/20 '5\' 6"'$  (1.676 m)   Weight:  Wt Readings from Last 1 Encounters:  12/15/20 55.1 kg   BMI:  Body mass index is 19.61 kg/m.  Estimated Nutritional Needs:  Kcal:  [id number] Protein:  85-100 grams Fluid:  >1.75 L  Derrel Nip, RD, LDN (she/her/hers) Registered Dietitian I After-Hours/Weekend Pager # in [place]

## 2020-12-15 NOTE — TOC Progression Note (Signed)
Transition of Care [facility]) - Progression Note    Patient Details  Name: Stacy Moran MRN: [id number] Date of Birth: November 23, 1955  Transition of [facility]) CM/SW Contact  Anselm Pancoast, RN Phone Number: 12/15/2020, 5:19 PM  Clinical Narrative:    According to Gabriel Cirri, [place] [facility] which owns [place] facility is not able to accept a trach patient at this time due to medical director change.   TOC will need to assist family with alternate facility at discharge.         Expected Discharge Plan and Services                                                 Social Determinants of Health (SDOH) Interventions    Readmission Risk Interventions No flowsheet data found.

## 2020-12-16 DIAGNOSIS — J9503 Malfunction of tracheostomy stoma: Secondary | ICD-10-CM | POA: Diagnosis not present

## 2020-12-16 DIAGNOSIS — Z7189 Other specified counseling: Secondary | ICD-10-CM | POA: Diagnosis not present

## 2020-12-16 DIAGNOSIS — Z515 Encounter for palliative care: Secondary | ICD-10-CM

## 2020-12-16 DIAGNOSIS — J9601 Acute respiratory failure with hypoxia: Secondary | ICD-10-CM | POA: Diagnosis not present

## 2020-12-16 LAB — GLUCOSE, CAPILLARY
Glucose-Capillary: 100 mg/dL — ABNORMAL HIGH (ref 70–99)
Glucose-Capillary: 121 mg/dL — ABNORMAL HIGH (ref 70–99)
Glucose-Capillary: 151 mg/dL — ABNORMAL HIGH (ref 70–99)
Glucose-Capillary: 152 mg/dL — ABNORMAL HIGH (ref 70–99)
Glucose-Capillary: 154 mg/dL — ABNORMAL HIGH (ref 70–99)
Glucose-Capillary: 167 mg/dL — ABNORMAL HIGH (ref 70–99)
Glucose-Capillary: 62 mg/dL — ABNORMAL LOW (ref 70–99)
Glucose-Capillary: 71 mg/dL (ref 70–99)

## 2020-12-16 MED ORDER — INSULIN ASPART 100 UNIT/ML IJ SOLN
0.0000 [IU] | INTRAMUSCULAR | Status: DC
  Administered 2020-12-16 – 2020-12-17 (×2): 2 [IU] via SUBCUTANEOUS
  Administered 2020-12-17: 21:00:00 1 [IU] via SUBCUTANEOUS
  Administered 2020-12-17 – 2020-12-19 (×5): 2 [IU] via SUBCUTANEOUS
  Administered 2020-12-19: 09:00:00 1 [IU] via SUBCUTANEOUS
  Administered 2020-12-20: 2 [IU] via SUBCUTANEOUS
  Administered 2020-12-21: 1 [IU] via SUBCUTANEOUS
  Administered 2020-12-22: 2 [IU] via SUBCUTANEOUS
  Filled 2020-12-16 (×9): qty 1

## 2020-12-16 MED ORDER — DEXTROSE 50 % IV SOLN: 12.5000 g | INTRAVENOUS | Status: AC

## 2020-12-16 MED ORDER — GUAIFENESIN 100 MG/5ML PO SOLN
10.0000 mL | Freq: Three times a day (TID) | ORAL | Status: DC
  Administered 2020-12-16 – 2021-02-08 (×162): 200 mg
  Filled 2020-12-16 (×19): qty 10
  Filled 2020-12-16: qty 20
  Filled 2020-12-16 (×13): qty 10
  Filled 2020-12-16: qty 20
  Filled 2020-12-16 (×9): qty 10
  Filled 2020-12-16: qty 20
  Filled 2020-12-16 (×7): qty 10
  Filled 2020-12-16: qty 100
  Filled 2020-12-16: qty 20
  Filled 2020-12-16 (×3): qty 10
  Filled 2020-12-16: qty 20
  Filled 2020-12-16 (×24): qty 10
  Filled 2020-12-16: qty 20
  Filled 2020-12-16 (×7): qty 10
  Filled 2020-12-16: qty 20
  Filled 2020-12-16 (×4): qty 10
  Filled 2020-12-16: qty 20
  Filled 2020-12-16 (×6): qty 10
  Filled 2020-12-16: qty 20
  Filled 2020-12-16: qty 10
  Filled 2020-12-16: qty 20
  Filled 2020-12-16 (×16): qty 10
  Filled 2020-12-16: qty 20
  Filled 2020-12-16 (×2): qty 10
  Filled 2020-12-16: qty 100
  Filled 2020-12-16 (×2): qty 10
  Filled 2020-12-16: qty 20
  Filled 2020-12-16 (×10): qty 10
  Filled 2020-12-16: qty 20
  Filled 2020-12-16 (×25): qty 10

## 2020-12-16 MED ORDER — DEXTROSE 50 % IV SOLN
INTRAVENOUS | Status: AC
  Administered 2020-12-16: 05:00:00 12.5 g via INTRAVENOUS
  Filled 2020-12-16: qty 50

## 2020-12-16 NOTE — Consult Note (Addendum)
Consultation Note Date: 12/16/2020   Patient Name: Stacy Moran  DOB: Jan 27, 1956  MRN: [id number]  Age / Sex: 65 y.o., female  PCP: Stacy Pier, MD Referring Physician: Shawna Clamp, MD  Reason for Consultation: Establishing goals of care  HPI/Patient Profile: 65 y.o. female  with past medical history of SDH, ventriculoperitoneal shunt for recurrent hydrocephalus, ruptured PICA aneurysm with resulting SAH, chronic tracheostomy with vocal cord paralysis, PEG tube, seizures, hypothyroidism, anxiety, falls admitted on 12/14/2020 with respiratory distress and tracheostomy on the floor.   Clinical Assessment and Goals of Care: I met today with Stacy Moran. She is awake, alert and able to speak (although voice is weak, hoarse, and difficult to understand at times through secretions). She appears alert and oriented. She tells me that she is originally from [place] and moved to the [place] not long ago to be close to her daughter and grandchildren. She tells me that she is able to walk with walker (admits that she sometimes forgets the walker but denies any falls). Other than her issues with her tracheostomy her only other complaint is not being able to eat - "I love to eat!" We discussed her weak voice and severe aspiration risk with noted vocal cord paralysis and weak muscles to move food/drink down through the right pipe and risk of it all going into her lungs. She expresses understanding but remains frustrated with being unable to enjoy food/drink.   When we discuss more about her tracheostomy she tells me that her trach has fallen out in her sleep and she does not even know what happened. I did read a note from Stacy Moran ED 12/08/20 by RN Stacy Moran that inner cannula came out onto floor when being transferred to stretcher. I wonder if trach is being securely connected when being changed/cleaned at facility. [name] also complains of copious and thick secretions. RN reports difficulty with tracheal suction due to thickness of secretions. Recommend consideration of guaifenesin to thin and allow for easier suction or clearance. I also wonder if Stacy Moran would benefit from tracheostomy clinic that can help make recommendations to manage secretions and tracheostomy safety as well. I also question if tracheostomy could be becoming occluded and if she could pull it out in distress and panic (I have had this happen with another patient recently). Difficult to know for sure.   I called and spoke with daughter, Stacy Moran. Stacy Moran has very good understanding of her mother's underlying condition. Stacy Moran shares that her mother is very independent and bathes herself and even does her own laundry at facility. She really only needs assistance with trach care and suction. Stacy Moran is concerned about what is happening with her mother's tracheostomy and she shares that her mother is very sharp and "she sings like a canary" so she does not feel she is being dishonest about pulling out tracheostomy. I tend to believe Stacy Moran as well. Regardless she is unable to return to previous facility and [facility] working to find alternative placement. Patient and family prefer  [facility] if at all possible. Stacy Moran also asks about #4 shiley being too small and secretions/occlusion more of an issue which I feel is a valid concern but will leave this to ENT and attending to assess. Stacy Moran also reports that they have had visits in trach clinic previously but not since June - she agrees we could consider another visit after discharge given tracheostomy concerns.   All questions/concerns addressed. Emotional support provided.   Primary Decision Maker PATIENT    SUMMARY OF RECOMMENDATIONS   - Very good functional status - Only complaint is inability to eat/drink due to severe aspiration risk but otherwise good quality of life - Hopeful for continued  improvement and maintaining as much independence as possible - Recommend better management of secretions - Consider follow up in [facility] in [place]: Full code   Symptom Management:  Thick secretions (likely leading to more occlusion of trach given small cannula): Guaifenesin 200 mg every 8 hours initiated.   Palliative Prophylaxis:  Aspiration, Bowel Regimen, Delirium Protocol, and Oral Care  Additional Recommendations (Limitations, Scope, Preferences): Full Scope Treatment  Prognosis:  Unable to determine  Discharge Planning: To Be Determined      Primary Diagnoses: Present on Admission:  Acute respiratory failure ([place])   I have reviewed the medical record, interviewed the patient and family, and examined the patient. The following aspects are pertinent.  Past Medical History:  Diagnosis Date   Acute respiratory failure ([place])    Hypertension    Prosthetic eye globe    Ruptured aneurysm of artery (HCC)    Seizures (HCC)    Status post insertion of percutaneous endoscopic gastrostomy (PEG) tube (HCC)    Subdural hematoma (HCC)    Tracheostomy dependent (HCC)    Tracheostomy present ([place])    Social History   Socioeconomic History   Marital status: Single    Spouse name: Not on file   Number of children: Not on file   Years of education: Not on file   Highest education level: Not on file  Occupational History   Not on file  Tobacco Use   Smoking status: Former   Smokeless tobacco: Never  Vaping Use   Vaping Use: Never used  Substance and Sexual Activity   Alcohol use: Not Currently   Drug use: Not Currently   Sexual activity: Not on file  Other Topics Concern   Not on file  Social History Narrative   Not on file   Social Determinants of Health   Financial Resource Strain: Not on file  Food Insecurity: Not on file  Transportation Needs: Not on file  Physical Activity: Not on file  Stress: Not on file   Social Connections: Not on file   History reviewed. No pertinent family history. Scheduled Meds:  amLODipine  10 mg Per Tube Daily   Chlorhexidine Gluconate Cloth  6 each Topical Daily   enoxaparin (LOVENOX) injection  40 mg Subcutaneous Q24H   feeding supplement (OSMOLITE 1.2 CAL)  237 mL Per Tube QID   feeding supplement (PROSource TF)  45 mL Per Tube BID   free water  200 mL Per Tube Q4H   insulin aspart  0-15 Units Subcutaneous Q4H   levETIRAcetam  750 mg Per Tube BID   levothyroxine  100 mcg Per Tube Q0600   pantoprazole (PROTONIX) IV  40 mg Intravenous QHS   polyethylene glycol  17 g Per Tube BID   QUEtiapine  50 mg Per Tube BID  senna  1 tablet Per Tube Daily   Continuous Infusions: PRN Meds:.acetaminophen, albuterol, docusate sodium, polyethylene glycol No Known Allergies Review of Systems  Constitutional:  Positive for appetite change. Negative for activity change and fatigue.  Respiratory:  Negative for shortness of breath.   Neurological:  Negative for weakness.   Physical Exam Vitals and nursing note reviewed.  Constitutional:      General: She is not in acute distress.    Appearance: She is ill-appearing.  Cardiovascular:     Rate and Rhythm: Normal rate.  Pulmonary:     Effort: No tachypnea, accessory muscle usage or respiratory distress.     Comments: Copious secretions audible and with strong cough but not always clearing secretions fully  Trach to oxygen; no vent Abdominal:     Palpations: Abdomen is soft.  Neurological:     Mental Status: She is alert and oriented to person, place, and time.    Vital Signs: BP 114/64 (BP Location: Left Arm)   Pulse 70   Temp 98.2 F (36.8 C) (Oral)   Resp 20   Ht 5' 6" (1.676 m)   Wt 55.1 kg   SpO2 100%   BMI 19.61 kg/m  Pain Scale: 0-10   Pain Score: 0-No pain   SpO2: SpO2: 100 % O2 Device:SpO2: 100 % O2 Flow Rate: .O2 Flow Rate (L/min): 5 L/min  IO: Intake/output summary: No intake or output data  in the 24 hours ending 12/16/20 1406  LBM: Last BM Date: 12/15/20 (per patient) Baseline Weight: Weight: 58.5 kg Most recent weight: Weight: 55.1 kg     Palliative Assessment/Data:     Time Total: 70 min  Greater than 50%  of this time was spent counseling and coordinating care related to the above assessment and plan.  Signed by: Vinie Sill, NP Palliative Medicine Team Pager # [phone] (M-F 8a-5p) Team Phone # [phone] (Nights/Weekends)

## 2020-12-16 NOTE — NC FL2 (Signed)
[place] LEVEL OF CARE SCREENING TOOL     IDENTIFICATION  Patient Name: Stacy Moran Birthdate: Nov 22, 1955 Sex: female Admission Date (Current Location): 12/14/2020  [facility] and [place] Number:  Engineering geologist and Address:  [facility], [address], [place], [place]  [address]      Provider Number: [id number]  Attending Physician Name and Address:  Shawna Clamp, MD  Relative Name and Phone Number:  [name] (daughter) [phone]    Current Level of Care: Hospital Recommended Level of Care: [place] Prior Approval Number:    Date Approved/Denied:   PASRR Number: [id number]  Discharge Plan: SNF    Current Diagnoses: Patient Active Problem List   Diagnosis Date Noted   Fall (on) (from) other stairs and steps, initial encounter 08/26/2020   Subdural hematoma ([place]) 07/31/2020   History of hemorrhagic cerebrovascular accident (CVA) with residual deficit 06/06/2020   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   S/P ventriculoperitoneal shunt 06/06/2020   Functional urinary incontinence 06/06/2020   Ineffective airway clearance    Thrombocytopenia (HCC)    Restlessness    Restlessness and agitation    Acute blood loss anemia    Essential hypertension    Seizure prophylaxis    Protein-calorie malnutrition, severe 04/24/2020   Dysphagia, post-stroke    Benign essential HTN    Dysphagia    Status post tracheostomy ([place])    S/P percutaneous endoscopic gastrostomy (PEG) tube placement (HCC)    ICH (intracerebral hemorrhage) ([place]) 04/22/2020   Obstructive hydrocephalus ([place])    Seizure-like activity ([place]) 03/12/2020   PEG (percutaneous endoscopic gastrostomy) status ([place]) 03/08/2020   Hyperglycemia 03/08/2020   Acute respiratory failure (HCC)    Ventilator dependence ([place])    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) 01/30/2020    Orientation RESPIRATION BLADDER Height & Weight     Self, Situation, Place  Normal, Tracheostomy Continent Weight: 55.1 kg Height:  5\' 6"  (167.6 cm)  BEHAVIORAL SYMPTOMS/MOOD NEUROLOGICAL BOWEL NUTRITION STATUS      Continent Feeding tube (osmolite 1.2- 4 times per day bolus)  AMBULATORY STATUS COMMUNICATION OF NEEDS Skin   Supervision Verbally Normal                       Personal Care Assistance Level of Assistance  Bathing, Feeding, Dressing Bathing Assistance: Limited assistance Feeding assistance: Limited assistance Dressing Assistance: Limited assistance     Functional Limitations Info  Sight, Hearing, Speech Sight Info: Adequate Hearing Info: Adequate Speech Info: Impaired (passy muir)    SPECIAL CARE FACTORS FREQUENCY                       Contractures Contractures Info: Not present    Additional Factors Info  Code Status, Allergies Code Status Info: Full Allergies Info: NKA           Current Medications (12/16/2020):  This is the current hospital active medication list Current Facility-Administered Medications  Medication Dose Route Frequency Provider Last Rate Last Admin   acetaminophen (TYLENOL)  tablet 650 mg  650 mg Per Tube Q6H PRN Rust-Chester, Toribio Harbour L, NP       albuterol (PROVENTIL) (2.5 MG/3ML) 0.083% nebulizer solution 2.5 mg  2.5 mg Nebulization Q6H PRN Rust-Chester, Britton L, NP       amLODipine (NORVASC) tablet 10 mg  10 mg Per Tube Daily Rust-Chester, Britton L, NP   10 mg at 12/16/20 0754   Chlorhexidine Gluconate Cloth 2 % PADS 6 each  6 each Topical Daily Flora Lipps, MD   6 each at 12/16/20 0757   docusate  sodium (COLACE) capsule 100 mg  100 mg Oral BID PRN Rust-Chester, Toribio Harbour L, NP       enoxaparin (LOVENOX) injection 40 mg  40 mg Subcutaneous Q24H Rust-Chester, Britton L, NP   40 mg at 12/15/20 2304   feeding supplement (OSMOLITE 1.2 CAL) liquid 237 mL  237 mL Per Tube QID Shawna Clamp, MD   237 mL at 12/16/20 0809   feeding supplement (PROSource TF) liquid 45 mL  45 mL Per Tube BID Shawna Clamp, MD   45 mL at 12/16/20 0757   free water 200 mL  200 mL Per Tube Q4H Shawna Clamp, MD   200 mL at 12/16/20 0810   insulin aspart (novoLOG) injection 0-15 Units  0-15 Units Subcutaneous Q4H Rust-Chester, Britton L, NP   3 Units at 12/16/20 0132   levETIRAcetam (KEPPRA) 100 MG/ML solution 750 mg  750 mg Per Tube BID Rust-Chester, Britton L, NP   750 mg at 12/16/20 0757   levothyroxine (SYNTHROID) tablet 100 mcg  100 mcg Per Tube Q0600 Rust-Chester, Britton L, NP   100 mcg at 12/16/20 0513   pantoprazole (PROTONIX) injection 40 mg  40 mg Intravenous QHS Rust-Chester, Britton L, NP   40 mg at 12/15/20 2303   polyethylene glycol (MIRALAX / GLYCOLAX) packet 17 g  17 g Oral Daily PRN Rust-Chester, Toribio Harbour L, NP       polyethylene glycol (MIRALAX / GLYCOLAX) packet 17 g  17 g Per Tube BID Rust-Chester, Britton L, NP   17 g at 12/16/20 0755   QUEtiapine (SEROQUEL) tablet 50 mg  50 mg Per Tube BID Rust-Chester, Britton L, NP   50 mg at 12/16/20 0754   senna (SENOKOT) tablet 8.6 mg  1 tablet Per Tube Daily Rust-Chester, Britton L, NP   8.6 mg at 12/16/20 3845     Discharge Medications: Please see discharge summary for a list of discharge medications.  Relevant Imaging Results:  Relevant Lab Results:   Additional Information SS#: [id number]  [name], RN

## 2020-12-16 NOTE — Progress Notes (Signed)
Inpatient Diabetes Program Recommendations  AACE/ADA: New Consensus Statement on Inpatient Glycemic Control (2015)  Target Ranges:  Prepandial:   less than 140 mg/dL      Peak postprandial:   less than 180 mg/dL (1-2 hours)      Critically ill patients:  140 - 180 mg/dL   Lab Results  Component Value Date   GLUCAP 100 (H) 12/16/2020   HGBA1C 6.2 (H) 12/14/2020    Review of Glycemic Control Results for KEYRY, IRACHETA (MRN [id number]) as of 12/16/2020 10:17  Ref. Range 12/15/2020 20:04 12/16/2020 01:28 12/16/2020 04:40 12/16/2020 05:12 12/16/2020 07:39  Glucose-Capillary Latest Ref Range: 70 - 99 mg/dL 157 (H) 154 (H) Novolog 3 units 62 (L) 121 (H) 100 (H)   Inpatient Diabetes Program Recommendations:   Patient had hypoglycemia post Novolog correction -Decrease Novolog correction to 0-9 units q 4 hrs. Secure chat sent to Dr. Dwyane Dee.  Thank you, Nani Gasser. Zilphia Kozinski, RN, MSN, CDE  Diabetes Coordinator Inpatient Glycemic Control Team Team Pager [phone] (8am-5pm) 12/16/2020 10:20 AM

## 2020-12-16 NOTE — Progress Notes (Addendum)
SLP Cancellation Note  Patient Details Name: Stacy Moran MRN: [id number] DOB: 1955/06/01   Cancelled treatment:       Reason Eval/Treat Not Completed: Medical issues which prohibited therapy;Patient not medically ready (chart reviewed; consulted MD). Chart reviewed. Per notes, including ST notes, pt has a Baseline of "pharyngeal and cervical esophageal dysfunction remains significant w/ mod-severe aspiration risk". Multiple MBSSs this year; last one was in 09/2020 w/ NPO status recommended.  Pt is recommended to continue NPO w/ PEG TFs as her nutrition/hydration d/t her Risk for Aspiration documented per MBSSs. F/u w/ ST services at her SNF for further dysphagia tx in hopes of a potential repeat of MBSS in the future. Recommend frequent oral care for hygiene and stimulation of swallowing.       Orinda Kenner, MS, CCC-SLP Speech Language Pathologist Rehab Services [phone] [facility] 12/16/2020, 11:02 AM

## 2020-12-16 NOTE — TOC Initial Note (Signed)
Transition of Care [facility]) - Initial/Assessment Note    Patient Details  Name: Stacy Moran MRN: [id number] Date of Birth: 04/10/1955  Transition [facility]) CM/SW Contact:    [name], RN Phone Number: 12/16/2020, 4:01 PM  Clinical Narrative:                 Patient admitted to the hospital after her trach came out at the nursing facility.  Lurline Idol has been replaced and patient is ready to return to a skilled facility.  Unfortunately [facility] is unable to accept trach patient's at this time.  Daughter did not want patient to return to [place] anyway.  TOC has started bed search in this and surrounding areas.    Expected Discharge Plan: [place] Barriers to Discharge: No SNF bed   Patient Goals and CMS Choice Patient states their goals for this hospitalization and ongoing recovery are:: daughter does not want patient to go back to [facility] CMS Medicare.gov Compare Post Acute Care list provided to:: Patient Represenative (must comment) Choice offered to / list presented to : Adult Children  Expected Discharge Plan and Services Expected Discharge Plan: [place]   Discharge Planning Services: CM Consult Post Acute Care Choice: [place] Living arrangements for the past 2 months: [place]                 DME Arranged: N/A DME Agency: NA       HH Arranged: NA [place] Agency: NA        Prior Living Arrangements/Services Living arrangements for the past 2 months: [place] Lives with:: Facility Resident Patient language and need for interpreter reviewed:: Yes Do you feel safe going back to the place where you live?: Yes      Need for Family Participation in Patient Care: Yes (Comment) Care giver support system in place?: Yes (comment)   Criminal Activity/Legal Involvement Pertinent to Current Situation/Hospitalization: No - Comment as needed  Activities of  Daily Living Home Assistive Devices/Equipment: Communication device (specify type), Enteral Feeding Supplies, Oxygen, Walker (specify type), Vent/Trach supplies ADL Screening (condition at time of admission) Patient's cognitive ability adequate to safely complete daily activities?: No Is the patient deaf or have difficulty hearing?: No Does the patient have difficulty seeing, even when wearing glasses/contacts?: No Does the patient have difficulty concentrating, remembering, or making decisions?: No Patient able to express need for assistance with ADLs?: Yes Does the patient have difficulty dressing or bathing?: Yes Independently performs ADLs?: No Does the patient have difficulty walking or climbing stairs?: Yes Weakness of Legs: None Weakness of Arms/Hands: None  Permission Sought/Granted Permission sought to share information with : Case Manager, Family Supports, Chartered certified accountant granted to share information with : Yes, Verbal Permission Granted  Share Information with NAME: [name]  Permission granted to share info w AGENCY: SNF's  Permission granted to share info w Relationship: daughter     Emotional Assessment Appearance:: Appears older than stated age     Orientation: : Oriented to Self Alcohol / Substance Use: Not Applicable Psych Involvement: No (comment)  Admission diagnosis:  Respiratory distress [R06.03] Tracheostomy malfunction ([place]) [J95.03] Acute respiratory failure with hypoxia ([place]) [J96.01] Acute respiratory failure ([place]) [J96.00] Patient Active Problem List   Diagnosis Date Noted   Fall (on) (from) other stairs and steps, initial encounter 08/26/2020   Subdural hematoma ([place]) 07/31/2020   History of hemorrhagic cerebrovascular accident (CVA) with residual deficit  06/06/2020   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   S/P ventriculoperitoneal shunt 06/06/2020   Functional urinary incontinence 06/06/2020   Ineffective airway  clearance    Thrombocytopenia (HCC)    Restlessness    Restlessness and agitation    Acute blood loss anemia    Essential hypertension    Seizure prophylaxis    Protein-calorie malnutrition, severe 04/24/2020   Dysphagia, post-stroke    Benign essential HTN    Dysphagia    Status post tracheostomy ([place])    S/P percutaneous endoscopic gastrostomy (PEG) tube placement (HCC)    ICH (intracerebral hemorrhage) ([place]) 04/22/2020   Obstructive hydrocephalus ([place])    Seizure-like activity ([place]) 03/12/2020   PEG (percutaneous endoscopic gastrostomy) status ([place]) 03/08/2020   Hyperglycemia 03/08/2020   Acute respiratory failure (HCC)    Ventilator dependence ([place])    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 [place] [address] Phone: [phone] Fax: [place] [address] Phone: [phone] Fax: [phone]  [facility] [phone] - [place], [place] - [address] [place] [address] Phone: [phone] Fax: [phone]  [facility] [place], [place] - [address] [place] [address] Phone: [phone] Fax: [phone]     Social Determinants of Health (SDOH) Interventions    Readmission Risk Interventions No flowsheet data found.

## 2020-12-16 NOTE — Progress Notes (Signed)
PROGRESS NOTE    Stacy Moran  ACZ:[id number] DOB: 1955/05/02 DOA: 12/14/2020 PCP: [name], MD    Brief Narrative:  This 65 yrs old female with PMH significant for vocal cord paralysis with chronic tracheostomy, hypertension, history of subdural hematoma, ruptured brain aneurysm with resulting subarachnoid hemorrhage, seizure disorder, postoperative hypothyroidism, chronic PEG tube, anxiety, ventriculoperitoneal shunt for recurrent hydrocephalus who was sent from [place] home with respiratory distress.  Patient was found in respiratory distress in her room with a tracheostomy tube on the floor.  This is her [facility] visit to the [facility] ED due to tracheostomy complications in 1 month.  Patient is not ventilator dependent but on oxygen at night. Patient was significantly short of breath on arrival in the ED requiring nonrebreather with improvement in SPO2.  Respiratory therapist was unable to replace tracheostomy through existing stoma.  ENT was emergently called to replace tracheostomy. Patient was intubated and taken to the OR for placement of tracheostomy tube.  Patient was admitted in the ICU post procedure  for mechanical ventilation support. Patient was subsequently extubated next day, not requiring pressors, taken over by [facility]. Patient is awaiting SNF placement.  Patient cannot go back to the previous SNF.  TOC is working on helping family to find placement.  Assessment & Plan:   Active Problems:   Acute respiratory failure (HCC)  Acute on chronic hypoxic respiratory failure sec. to tracheostomy dislodgment: Patient does have history of B/L vocal cord paralysis with tracheotomy dependence. Patient was found in respiratory distress with tracheostomy tube on the floor. Patient was significantly hypoxic on arrival requiring nonrebreather. RT was not successful to put tracheostomy tube through existing stoma. ENT was consulted,  Patient was intubated and underwent  tracheostomy replacement in the OR. Patient was on mechanical ventilation in ICU, successfully extubated. Continue oxygen supplementation through tracheostomy collar. Patient is alert and responsive, fully awake and oriented. Continue bronchodilators as needed. Patient cannot go back to the previous SNF, TOC is working on helping family to find SNF.  Seizure disorder: Continue Keppra 750 mg twice daily.  Continue Seroquel twice daily.  History of SAH, subdural hematoma, recurrent hydrocephalus: Patient does have chronic VP shunt, continue neurochecks. Continue fall and seizure precautions. Continue supportive care.  Essential hypertension: Continue amlodipine.  Hypothyroidism : Continue Synthroid  Diabetes mellitus: Continue sliding scale Continue hypoglycemia protocol.  S/p PEG tube: Continue tube feeding with Osmolite and free water flush.  DVT prophylaxis: Lovenox Code Status: Full code Family Communication: No family at bedside Disposition Plan:    Status is: Inpatient  Remains inpatient appropriate because:Inpatient level of care appropriate due to severity of illness  Dispo: The patient is from: SNF              Anticipated d/c is to: SNF              Patient currently is not medically stable to d/c.   Difficult to place patient No  Patient cannot go back to same SNF due to director change.  TOC is working on helping family finding a SNF  Consultants:  PCCM ENT  Procedures: Replacement of tracheostomy tube.  Antimicrobials:   Anti-infectives (From admission, onward)    None       Subjective: Patient was seen and examined at bedside.  Overnight events noted.   Patient reports feeling much improved, She is alert,  awake and fully cooperative. Patient appears very deconditioned, it seems tracheostomy is working fine.  Objective: Vitals:  12/15/20 2005 12/16/20 0130 12/16/20 0442 12/16/20 0738  BP: 111/76 101/65 94/65 113/78  Pulse: 73 77 70 (!)  56  Resp: 16 17 16 20   Temp: 98.4 F (36.9 C)  (!) 97.4 F (36.3 C) (!) 97.5 F (36.4 C)  TempSrc:    Oral  SpO2: 100% 100% 100% 100%  Weight:      Height:       No intake or output data in the 24 hours ending 12/16/20 1014  Filed Weights   12/14/20 0231 12/14/20 2214 12/15/20 0313  Weight: 58.5 kg 55.1 kg 55.1 kg    Examination:  General exam: Appears comfortable, deconditioned, thin built, not in any acute distress.  Trach collar noted  respiratory system: Clear to auscultation bilaterally, no wheezing, no crackles. Cardiovascular system: S1-S2 heard, regular rate and rhythm, no murmur. Gastrointestinal system: Abdomen is soft, nontender, nondistended, PEG tube noted, BS +. Central nervous system: Alert and oriented x 3, no focal neurological deficits. Extremities: No edema, no cyanosis, no clubbing. Skin: No rashes, lesions or ulcers Psychiatry:  Mood & affect appropriate.     Data Reviewed: I have personally reviewed following labs and imaging studies  CBC: Recent Labs  Lab 12/14/20 0225 12/15/20 0430  WBC 10.6* 4.4  NEUTROABS 5.5 2.2  HGB 13.3 11.0*  HCT 42.3 35.3*  MCV 83.6 82.1  PLT 154 532*   Basic Metabolic Panel: Recent Labs  Lab 12/14/20 0225 12/14/20 1020 12/15/20 0430  NA 142 142 140  K 3.4* 4.2 3.7  CL 102 104 106  CO2 31 30 30   GLUCOSE 222* 113* 129*  BUN 23 20 18   CREATININE 0.66 0.67 0.58  CALCIUM 9.8 9.4 9.2  MG 2.3  --  2.3  PHOS 3.6  --  3.4   GFR: Estimated Creatinine Clearance: 62.6 mL/min (by C-G formula based on SCr of 0.58 mg/dL). Liver Function Tests: No results for input(s): AST, ALT, ALKPHOS, BILITOT, PROT, ALBUMIN in the last 168 hours. No results for input(s): LIPASE, AMYLASE in the last 168 hours. No results for input(s): AMMONIA in the last 168 hours. Coagulation Profile: Recent Labs  Lab 12/14/20 0340  INR 1.0   Cardiac Enzymes: No results for input(s): CKTOTAL, CKMB, CKMBINDEX, TROPONINI in the last 168  hours. BNP (last 3 results) No results for input(s): PROBNP in the last 8760 hours. HbA1C: Recent Labs    12/14/20 1020  HGBA1C 6.2*   CBG: Recent Labs  Lab 12/15/20 2004 12/16/20 0128 12/16/20 0440 12/16/20 0512 12/16/20 0739  GLUCAP 157* 154* 62* 121* 100*   Lipid Profile: No results for input(s): CHOL, HDL, LDLCALC, TRIG, CHOLHDL, LDLDIRECT in the last 72 hours. Thyroid Function Tests: No results for input(s): TSH, T4TOTAL, FREET4, T3FREE, THYROIDAB in the last 72 hours. Anemia Panel: No results for input(s): VITAMINB12, FOLATE, FERRITIN, TIBC, IRON, RETICCTPCT in the last 72 hours. Sepsis Labs: No results for input(s): PROCALCITON, LATICACIDVEN in the last 168 hours.  Recent Results (from the past 240 hour(s))  Resp Panel by RT-PCR (Flu A&B, Covid) Nasopharyngeal Swab     Status: None   Collection Time: 12/14/20  2:25 AM   Specimen: Nasopharyngeal Swab; Nasopharyngeal(NP) swabs in vial transport medium  Result Value Ref Range Status   SARS Coronavirus 2 by RT PCR NEGATIVE NEGATIVE Final    Comment: (NOTE) SARS-CoV-2 target nucleic acids are NOT DETECTED.  The SARS-CoV-2 RNA is generally detectable in upper respiratory specimens during the acute phase of infection. The lowest concentration of SARS-CoV-2 viral  copies this assay can detect is 138 copies/mL. A negative result does not preclude SARS-Cov-2 infection and should not be used as the sole basis for treatment or other patient management decisions. A negative result may occur with  improper specimen collection/handling, submission of specimen other than nasopharyngeal swab, presence of viral mutation(s) within the areas targeted by this assay, and inadequate number of viral copies(<138 copies/mL). A negative result must be combined with clinical observations, patient history, and epidemiological information. The expected result is Negative.  Fact Sheet for Patients:   EntrepreneurPulse.com.au  Fact Sheet for Healthcare Providers:  IncredibleEmployment.be  This test is no t yet approved or cleared by the [place] FDA and  has been authorized for detection and/or diagnosis of SARS-CoV-2 by FDA under an Emergency Use Authorization (EUA). This EUA will remain  in effect (meaning this test can be used) for the duration of the COVID-19 declaration under Section 564(b)(1) of the Act, 21 U.S.C.section 360bbb-3(b)(1), unless the authorization is terminated  or revoked sooner.       Influenza A by PCR NEGATIVE NEGATIVE Final   Influenza B by PCR NEGATIVE NEGATIVE Final    Comment: (NOTE) The Xpert Xpress SARS-CoV-2/FLU/RSV plus assay is intended as an aid in the diagnosis of influenza from Nasopharyngeal swab specimens and should not be used as a sole basis for treatment. Nasal washings and aspirates are unacceptable for Xpert Xpress SARS-CoV-2/FLU/RSV testing.  Fact Sheet for Patients: EntrepreneurPulse.com.au  Fact Sheet for Healthcare Providers: IncredibleEmployment.be  This test is not yet approved or cleared by the [place] FDA and has been authorized for detection and/or diagnosis of SARS-CoV-2 by FDA under an Emergency Use Authorization (EUA). This EUA will remain in effect (meaning this test can be used) for the duration of the COVID-19 declaration under Section 564(b)(1) of the Act, 21 U.S.C. section 360bbb-3(b)(1), unless the authorization is terminated or revoked.  Performed at [facility], [address] [place], [place] [address]   MRSA Next Gen by PCR, Nasal     Status: None   Collection Time: 12/14/20 11:52 PM   Specimen: Nasal Mucosa; Nasal Swab  Result Value Ref Range Status   MRSA by PCR Next Gen NOT DETECTED NOT DETECTED Final    Comment: (NOTE) The GeneXpert MRSA Assay (FDA approved for NASAL specimens only), is one component of a  comprehensive MRSA colonization surveillance program. It is not intended to diagnose MRSA infection nor to guide or monitor treatment for MRSA infections. Test performance is not FDA approved in patients less than 54 years old. Performed at [facility], [address], [place],  [address]     Radiology Studies: No results found.  Scheduled Meds:  amLODipine  10 mg Per Tube Daily   Chlorhexidine Gluconate Cloth  6 each Topical Daily   enoxaparin (LOVENOX) injection  40 mg Subcutaneous Q24H   feeding supplement (OSMOLITE 1.2 CAL)  237 mL Per Tube QID   feeding supplement (PROSource TF)  45 mL Per Tube BID   free water  200 mL Per Tube Q4H   insulin aspart  0-15 Units Subcutaneous Q4H   levETIRAcetam  750 mg Per Tube BID   levothyroxine  100 mcg Per Tube Q0600   pantoprazole (PROTONIX) IV  40 mg Intravenous QHS   polyethylene glycol  17 g Per Tube BID   QUEtiapine  50 mg Per Tube BID   senna  1 tablet Per Tube Daily   Continuous Infusions:   LOS: 2 days  Time spent: 25 mins.  Shawna Clamp, MD [facility]   If 7PM-7AM, please contact night-coverage

## 2020-12-17 DIAGNOSIS — J9601 Acute respiratory failure with hypoxia: Secondary | ICD-10-CM | POA: Diagnosis not present

## 2020-12-17 LAB — CBC
HCT: 35.5 % — ABNORMAL LOW (ref 36.0–46.0)
Hemoglobin: 11.4 g/dL — ABNORMAL LOW (ref 12.0–15.0)
MCH: 26.5 pg (ref 26.0–34.0)
MCHC: 32.1 g/dL (ref 30.0–36.0)
MCV: 82.6 fL (ref 80.0–100.0)
Platelets: 129 10*3/uL — ABNORMAL LOW (ref 150–400)
RBC: 4.3 MIL/uL (ref 3.87–5.11)
RDW: 13.8 % (ref 11.5–15.5)
WBC: 4.2 10*3/uL (ref 4.0–10.5)
nRBC: 0 % (ref 0.0–0.2)

## 2020-12-17 LAB — BASIC METABOLIC PANEL
Anion gap: 5 (ref 5–15)
BUN: 23 mg/dL (ref 8–23)
CO2: 32 mmol/L (ref 22–32)
Calcium: 9.3 mg/dL (ref 8.9–10.3)
Chloride: 105 mmol/L (ref 98–111)
Creatinine, Ser: 0.55 mg/dL (ref 0.44–1.00)
GFR, Estimated: >60 mL/min (ref >60)
Glucose, Bld: 75 mg/dL (ref 70–99)
Potassium: 4 mmol/L (ref 3.5–5.1)
Sodium: 142 mmol/L (ref 135–145)

## 2020-12-17 LAB — GLUCOSE, CAPILLARY
Glucose-Capillary: 103 mg/dL — ABNORMAL HIGH (ref 70–99)
Glucose-Capillary: 111 mg/dL — ABNORMAL HIGH (ref 70–99)
Glucose-Capillary: 131 mg/dL — ABNORMAL HIGH (ref 70–99)
Glucose-Capillary: 158 mg/dL — ABNORMAL HIGH (ref 70–99)
Glucose-Capillary: 79 mg/dL (ref 70–99)
Glucose-Capillary: 85 mg/dL (ref 70–99)

## 2020-12-17 LAB — PHOSPHORUS: Phosphorus: 3.2 mg/dL (ref 2.5–4.6)

## 2020-12-17 LAB — MAGNESIUM: Magnesium: 2.4 mg/dL (ref 1.7–2.4)

## 2020-12-17 MED ORDER — OSMOLITE 1.2 CAL PO LIQD
355.0000 mL | Freq: Four times a day (QID) | ORAL | Status: DC
  Administered 2020-12-17 – 2020-12-25 (×33): 355 mL
  Administered 2020-12-25 (×2): 237 mL
  Administered 2020-12-25 – 2020-12-31 (×23): 355 mL

## 2020-12-17 NOTE — TOC Progression Note (Signed)
Transition of Care [facility]) - Progression Note    Patient Details  Name: Stacy Moran MRN: [id number] Date of Birth: 16-Aug-1955  Transition of [facility]) CM/SW Contact  [name], RN Phone Number: 12/17/2020, 3:46 PM  Clinical Narrative:    RNCM was able to speak with patient's daughter.  Daughter, Stacy Moran updated that [facility] cannot take patient back.  Stacy Moran is okay with this as she was not really happy with [facility] anyway.  No bed offers at this time, bed search extended.  [facility] will look at the referral to see if they can offer.   TOC will cont to follow.    Expected Discharge Plan: [place] Barriers to Discharge: No SNF bed  Expected Discharge Plan and Services Expected Discharge Plan: [place]   Discharge Planning Services: CM Consult Post Acute Care Choice: [place] Living arrangements for the past 2 months: [place]                 DME Arranged: N/A DME Agency: NA       HH Arranged: NA HH Agency: NA         Social Determinants of Health (SDOH) Interventions    Readmission Risk Interventions No flowsheet data found.

## 2020-12-17 NOTE — Progress Notes (Addendum)
PROGRESS NOTE    Stacy Moran  FGH:[id number] DOB: 27-Feb-1956 DOA: 12/14/2020 PCP: [name], MD    Brief Narrative:  This 65 yrs old female with PMH significant for vocal cord paralysis with chronic tracheostomy, hypertension, history of subdural hematoma, ruptured brain aneurysm with resulting subarachnoid hemorrhage, seizure disorder, postoperative hypothyroidism, chronic PEG tube, anxiety, ventriculoperitoneal shunt for recurrent hydrocephalus who was sent from [facility] with respiratory distress.  Patient was found in respiratory distress in her room with a tracheostomy tube on the floor.  This is her fifth visit to the [facility] ED due to tracheostomy complications in 1 month.  Patient is not ventilator dependent but on oxygen at night. Patient was significantly short of breath on arrival in the ED requiring nonrebreather with improvement in SPO2.  Respiratory therapist was unable to replace tracheostomy through existing stoma.  ENT was emergently called to replace tracheostomy. Patient was intubated and taken to the OR for placement of tracheostomy tube.  Patient was admitted in the ICU post procedure  for mechanical ventilation support. Patient was subsequently extubated next day, not requiring pressors, taken over by [facility]. Patient is awaiting SNF placement.  Patient cannot go back to the previous SNF.  TOC is working on helping family to find placement.  Assessment & Plan:     Acute respiratory failure with hypoxia due to displaced tracheostomy See progress note from 9/20.    ENT consulted, patient underwent tracheostomy replacement in the OR.  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  Will for discharge from respiratory standpoint. - Continue as needed albuterol   Dysphagia due to bilateral vocal cord dysfunction - Continue tube feeds    Seizures No seizures - Continue Keppra    History of subarachnoid  hemorrhage History of subdural hematoma Recurrent hydrocephalus History of VP shunt  -Continue Seroquel  Hypertension BP controlled - Continue amlodipine  Hypothyroidism -Continue levothyroxine  Type 2 diabetes without complication Glucose normal - Continue sliding scale corrections     DVT prophylaxis: Lovenox Code Status: Full code Family Communication: No family at bedside Disposition Plan:    Status is: Inpatient  Remains inpatient appropriate because:Inpatient level of care appropriate due to severity of illness  Dispo: The patient is from: SNF              Anticipated d/c is to: SNF              Patient currently is medically stable to d/c.   Difficult to place patient No      Patient was admitted due to tracheostomy malfunction.  This has been rectified, she is now stable for discharge.    Consultants:  PCCM ENT  Procedures: Replacement of tracheostomy tube.  Antimicrobials:   Anti-infectives (From admission, onward)    None       Subjective: Patient feels well.  She has slightly more secretions than normal, but no dyspnea, confusion.  No headache, chest pain, abdominal pain.  Objective: Vitals:   12/17/20 0416 12/17/20 0500 12/17/20 0727 12/17/20 1150  BP: 128/84  (!) 151/93 101/70  Pulse: 70  73 66  Resp: 20  18 20   Temp: 98.4 F (36.9 C)  98.3 F (36.8 C) 97.7 F (36.5 C)  TempSrc: Oral  Oral Oral  SpO2: 100%  100% 100%  Weight:  54.3 kg    Height:        Intake/Output Summary (Last 24 hours) at 12/17/2020 1501 Last data filed  at 12/16/2020 2012 Gross per 24 hour  Intake --  Output 0 ml  Net 0 ml    Filed Weights   12/14/20 2214 12/15/20 0313 12/17/20 0500  Weight: 55.1 kg 55.1 kg 54.3 kg    Examination:  General appearance: Elderly female, sitting up in bed, able to speak to her Passy-Muir valve, interactive and oriented     HEENT: Tracheostomy with thick clear secretions, no bleeding, no surrounding cellulitis.  Sclera  anicteric, conjunctival, lids and lashes normal.  No nasal deformity, discharge, or epistaxis. Skin:  Cardiac: RRR, no murmurs, no lower extremity edema Respiratory: Normal respiratory rate and rhythm, lungs clear without rales or wheezes Abdomen: Abdomen soft no tenderness palpation or guarding, no ascites or distention MSK:  Neuro: Awake and alert, extraocular movements intact, moves all extremities with generalized weakness but symmetric strength, speech fluent through her Passy-Muir valve Psych: attension normal, affect normal, judgment insight appear normal      Data Reviewed: I have personally reviewed following labs and imaging studies  CBC: Recent Labs  Lab 12/14/20 0225 12/15/20 0430 12/17/20 0425  WBC 10.6* 4.4 4.2  NEUTROABS 5.5 2.2  --   HGB 13.3 11.0* 11.4*  HCT 42.3 35.3* 35.5*  MCV 83.6 82.1 82.6  PLT 154 141* 250*   Basic Metabolic Panel: Recent Labs  Lab 12/14/20 0225 12/14/20 1020 12/15/20 0430 12/17/20 0425  NA 142 142 140 142  K 3.4* 4.2 3.7 4.0  CL 102 104 106 105  CO2 31 30 30  32  GLUCOSE 222* 113* 129* 75  BUN 23 20 18 23   CREATININE 0.66 0.67 0.58 0.55  CALCIUM 9.8 9.4 9.2 9.3  MG 2.3  --  2.3 2.4  PHOS 3.6  --  3.4 3.2   GFR: Estimated Creatinine Clearance: 61.7 mL/min (by C-G formula based on SCr of 0.55 mg/dL). Liver Function Tests: No results for input(s): AST, ALT, ALKPHOS, BILITOT, PROT, ALBUMIN in the last 168 hours. No results for input(s): LIPASE, AMYLASE in the last 168 hours. No results for input(s): AMMONIA in the last 168 hours. Coagulation Profile: Recent Labs  Lab 12/14/20 0340  INR 1.0   Cardiac Enzymes: No results for input(s): CKTOTAL, CKMB, CKMBINDEX, TROPONINI in the last 168 hours. BNP (last 3 results) No results for input(s): PROBNP in the last 8760 hours. HbA1C: No results for input(s): HGBA1C in the last 72 hours.  CBG: Recent Labs  Lab 12/16/20 1924 12/16/20 2342 12/17/20 0425 12/17/20 0726  12/17/20 1219  GLUCAP 167* 152* 79 85 158*   Lipid Profile: No results for input(s): CHOL, HDL, LDLCALC, TRIG, CHOLHDL, LDLDIRECT in the last 72 hours. Thyroid Function Tests: No results for input(s): TSH, T4TOTAL, FREET4, T3FREE, THYROIDAB in the last 72 hours. Anemia Panel: No results for input(s): VITAMINB12, FOLATE, FERRITIN, TIBC, IRON, RETICCTPCT in the last 72 hours. Sepsis Labs: No results for input(s): PROCALCITON, LATICACIDVEN in the last 168 hours.  Recent Results (from the past 240 hour(s))  Resp Panel by RT-PCR (Flu A&B, Covid) Nasopharyngeal Swab     Status: None   Collection Time: 12/14/20  2:25 AM   Specimen: Nasopharyngeal Swab; Nasopharyngeal(NP) swabs in vial transport medium  Result Value Ref Range Status   SARS Coronavirus 2 by RT PCR NEGATIVE NEGATIVE Final    Comment: (NOTE) SARS-CoV-2 target nucleic acids are NOT DETECTED.  The SARS-CoV-2 RNA is generally detectable in upper respiratory specimens during the acute phase of infection. The lowest concentration of SARS-CoV-2 viral copies this assay can detect  is 138 copies/mL. A negative result does not preclude SARS-Cov-2 infection and should not be used as the sole basis for treatment or other patient management decisions. A negative result may occur with  improper specimen collection/handling, submission of specimen other than nasopharyngeal swab, presence of viral mutation(s) within the areas targeted by this assay, and inadequate number of viral copies(<138 copies/mL). A negative result must be combined with clinical observations, patient history, and epidemiological information. The expected result is Negative.  Fact Sheet for Patients:  EntrepreneurPulse.com.au  Fact Sheet for Healthcare Providers:  IncredibleEmployment.be  This test is no t yet approved or cleared by the [place] FDA and  has been authorized for detection and/or diagnosis of SARS-CoV-2  by FDA under an Emergency Use Authorization (EUA). This EUA will remain  in effect (meaning this test can be used) for the duration of the COVID-19 declaration under Section 564(b)(1) of the Act, 21 U.S.C.section 360bbb-3(b)(1), unless the authorization is terminated  or revoked sooner.       Influenza A by PCR NEGATIVE NEGATIVE Final   Influenza B by PCR NEGATIVE NEGATIVE Final    Comment: (NOTE) The Xpert Xpress SARS-CoV-2/FLU/RSV plus assay is intended as an aid in the diagnosis of influenza from Nasopharyngeal swab specimens and should not be used as a sole basis for treatment. Nasal washings and aspirates are unacceptable for Xpert Xpress SARS-CoV-2/FLU/RSV testing.  Fact Sheet for Patients: EntrepreneurPulse.com.au  Fact Sheet for Healthcare Providers: IncredibleEmployment.be  This test is not yet approved or cleared by the [place] FDA and has been authorized for detection and/or diagnosis of SARS-CoV-2 by FDA under an Emergency Use Authorization (EUA). This EUA will remain in effect (meaning this test can be used) for the duration of the COVID-19 declaration under Section 564(b)(1) of the Act, 21 U.S.C. section 360bbb-3(b)(1), unless the authorization is terminated or revoked.  Performed at [facility], [address] [place], [place] [address]   MRSA Next Gen by PCR, Nasal     Status: None   Collection Time: 12/14/20 11:52 PM   Specimen: Nasal Mucosa; Nasal Swab  Result Value Ref Range Status   MRSA by PCR Next Gen NOT DETECTED NOT DETECTED Final    Comment: (NOTE) The GeneXpert MRSA Assay (FDA approved for NASAL specimens only), is one component of a comprehensive MRSA colonization surveillance program. It is not intended to diagnose MRSA infection nor to guide or monitor treatment for MRSA infections. Test performance is not FDA approved in patients less than 96 years old. Performed at [facility],  [address], [place], [place] [address]     Radiology Studies: No results found.  Scheduled Meds:  amLODipine  10 mg Per Tube Daily   Chlorhexidine Gluconate Cloth  6 each Topical Daily   enoxaparin (LOVENOX) injection  40 mg Subcutaneous Q24H   feeding supplement (OSMOLITE 1.2 CAL)  355 mL Per Tube QID   feeding supplement (PROSource TF)  45 mL Per Tube BID   free water  200 mL Per Tube Q4H   guaiFENesin  10 mL Per Tube Q8H   insulin aspart  0-9 Units Subcutaneous Q4H   levETIRAcetam  750 mg Per Tube BID   levothyroxine  100 mcg Per Tube Q0600   pantoprazole (PROTONIX) IV  40 mg Intravenous QHS   polyethylene glycol  17 g Per Tube BID   QUEtiapine  50 mg Per Tube BID   senna  1 tablet Per Tube Daily   Continuous Infusions:  LOS: 3 days   Time spent: 25 mins.  Edwin Dada, MD [facility]   If 7PM-7AM, please contact night-coverage

## 2020-12-18 DIAGNOSIS — J9601 Acute respiratory failure with hypoxia: Secondary | ICD-10-CM | POA: Diagnosis not present

## 2020-12-18 LAB — GLUCOSE, CAPILLARY
Glucose-Capillary: 101 mg/dL — ABNORMAL HIGH (ref 70–99)
Glucose-Capillary: 80 mg/dL (ref 70–99)
Glucose-Capillary: 165 mg/dL — ABNORMAL HIGH (ref 70–99)
Glucose-Capillary: 88 mg/dL (ref 70–99)
Glucose-Capillary: 182 mg/dL — ABNORMAL HIGH (ref 70–99)

## 2020-12-18 NOTE — Progress Notes (Signed)
PROGRESS NOTE    Stacy Moran  UXN:[id number] DOB: 08/22/1955 DOA: 12/14/2020 PCP: [name], MD    Brief Narrative:  This 65 yrs old female with PMH significant for vocal cord paralysis with chronic tracheostomy, hypertension, history of subdural hematoma, ruptured brain aneurysm with resulting subarachnoid hemorrhage, seizure disorder, postoperative hypothyroidism, chronic PEG tube, anxiety, ventriculoperitoneal shunt for recurrent hydrocephalus who was sent from [facility] home with respiratory distress.  Patient was found in respiratory distress in her room with a tracheostomy tube on the floor.  This is her fifth visit to the [facility] ED due to tracheostomy complications in 1 month.  Patient is not ventilator dependent but on oxygen at night. Patient was significantly short of breath on arrival in the ED requiring nonrebreather with improvement in SPO2.  Respiratory therapist was unable to replace tracheostomy through existing stoma.  ENT was emergently called to replace tracheostomy. Patient was intubated and taken to the OR for placement of tracheostomy tube.  Patient was admitted in the ICU post procedure  for mechanical ventilation support. Patient was subsequently extubated next day, not requiring pressors, taken over by [facility]. Patient is awaiting SNF placement.  Patient cannot go back to the previous SNF.  TOC is working on helping family to find placement.         Assessment & Plan:     Acute respiratory failure with hypoxia due to displaced tracheostomy See progress note from 9/20.    ENT consulted, patient underwent tracheostomy replacement in the OR.  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  Stable for discharge from a respiratory standpoint - Continue as needed albuterol   Dysphagia due to bilateral vocal cord dysfunction - Continue tube feeds    Seizures No seizures since admission - Continue  Keppra  History of subarachnoid hemorrhage History of subdural hematoma Recurrent hydrocephalus History of VP shunt  - Continue Seroquel  Hypertension Blood pressure normal - Continue amlodipine  Hypothyroidism - Continue levothyroxine  Type 2 diabetes without complication Glucose good - Continue sliding scale corrections     DVT prophylaxis: Lovenox Code Status: Full code Family Communication: No family at bedside Disposition Plan:    Status is: Inpatient  Remains inpatient appropriate because:Inpatient level of care appropriate due to severity of illness  Dispo: The patient is from: SNF              Anticipated d/c is to: SNF              Patient currently is medically stable to d/c.   Difficult to place patient No      Patient was admitted due to tracheostomy malfunction.  This has been rectified, she is now stable for discharge.    Consultants:  PCCM ENT  Procedures: Replacement of tracheostomy tube.  Antimicrobials:   Anti-infectives (From admission, onward)    None       Subjective: No complaints, fevers or [place] yesterday but still some.  No headache, respiratory distress, chest pain, abdominal pain.  Objective: Vitals:   12/17/20 2353 12/18/20 0456 12/18/20 0803 12/18/20 0845  BP: 106/80 118/74 117/78 117/81  Pulse: 79 68 (!) 59   Resp: 16 15 15 16   Temp: 98.4 F (36.9 C) 98.4 F (36.9 C) 98.2 F (36.8 C) 98.1 F (36.7 C)  TempSrc: Oral Oral  Oral  SpO2: 100% 98% 100% 100%  Weight:  58.8 kg    Height:        Intake/Output  Summary (Last 24 hours) at 12/18/2020 1517 Last data filed at 12/17/2020 2000 Gross per 24 hour  Intake 200 ml  Output --  Net 200 ml    Filed Weights   12/15/20 0313 12/17/20 0500 12/18/20 0456  Weight: 55.1 kg 54.3 kg 58.8 kg    Examination:  General appearance: Adult female, sitting up in bed, interactive     HEENT: Tracheostomy has no secretions, appears clean, no bleeding or surrounding  cellulitis. Skin:  Cardiac: RRR, no murmurs, no lower extremity edema Respiratory: Respiratory rate and rhythm, lungs clear without rales or wheezes Abdomen:   MSK:  Neuro: Awake and alert, extraocular movements intact, moves upper extremities with normal strength and coordination, face symmetric Psych: Attention normal, affect appropriate, judgment insight appear normal     Data Reviewed: I have personally reviewed following labs and imaging studies  CBC: Recent Labs  Lab 12/14/20 0225 12/15/20 0430 12/17/20 0425  WBC 10.6* 4.4 4.2  NEUTROABS 5.5 2.2  --   HGB 13.3 11.0* 11.4*  HCT 42.3 35.3* 35.5*  MCV 83.6 82.1 82.6  PLT 154 141* 585*   Basic Metabolic Panel: Recent Labs  Lab 12/14/20 0225 12/14/20 1020 12/15/20 0430 12/17/20 0425  NA 142 142 140 142  K 3.4* 4.2 3.7 4.0  CL 102 104 106 105  CO2 31 30 30  32  GLUCOSE 222* 113* 129* 75  BUN 23 20 18 23   CREATININE 0.66 0.67 0.58 0.55  CALCIUM 9.8 9.4 9.2 9.3  MG 2.3  --  2.3 2.4  PHOS 3.6  --  3.4 3.2   GFR: Estimated Creatinine Clearance: 66.8 mL/min (by C-G formula based on SCr of 0.55 mg/dL). Liver Function Tests: No results for input(s): AST, ALT, ALKPHOS, BILITOT, PROT, ALBUMIN in the last 168 hours. No results for input(s): LIPASE, AMYLASE in the last 168 hours. No results for input(s): AMMONIA in the last 168 hours. Coagulation Profile: Recent Labs  Lab 12/14/20 0340  INR 1.0   Cardiac Enzymes: No results for input(s): CKTOTAL, CKMB, CKMBINDEX, TROPONINI in the last 168 hours. BNP (last 3 results) No results for input(s): PROBNP in the last 8760 hours. HbA1C: No results for input(s): HGBA1C in the last 72 hours.  CBG: Recent Labs  Lab 12/17/20 2042 12/17/20 2356 12/18/20 0400 12/18/20 0813 12/18/20 1118  GLUCAP 131* 111* 101* 80 165*   Lipid Profile: No results for input(s): CHOL, HDL, LDLCALC, TRIG, CHOLHDL, LDLDIRECT in the last 72 hours. Thyroid Function Tests: No results for  input(s): TSH, T4TOTAL, FREET4, T3FREE, THYROIDAB in the last 72 hours. Anemia Panel: No results for input(s): VITAMINB12, FOLATE, FERRITIN, TIBC, IRON, RETICCTPCT in the last 72 hours. Sepsis Labs: No results for input(s): PROCALCITON, LATICACIDVEN in the last 168 hours.  Recent Results (from the past 240 hour(s))  Resp Panel by RT-PCR (Flu A&B, Covid) Nasopharyngeal Swab     Status: None   Collection Time: 12/14/20  2:25 AM   Specimen: Nasopharyngeal Swab; Nasopharyngeal(NP) swabs in vial transport medium  Result Value Ref Range Status   SARS Coronavirus 2 by RT PCR NEGATIVE NEGATIVE Final    Comment: (NOTE) SARS-CoV-2 target nucleic acids are NOT DETECTED.  The SARS-CoV-2 RNA is generally detectable in upper respiratory specimens during the acute phase of infection. The lowest concentration of SARS-CoV-2 viral copies this assay can detect is 138 copies/mL. A negative result does not preclude SARS-Cov-2 infection and should not be used as the sole basis for treatment or other patient management decisions. A negative  result may occur with  improper specimen collection/handling, submission of specimen other than nasopharyngeal swab, presence of viral mutation(s) within the areas targeted by this assay, and inadequate number of viral copies(<138 copies/mL). A negative result must be combined with clinical observations, patient history, and epidemiological information. The expected result is Negative.  Fact Sheet for Patients:  EntrepreneurPulse.com.au  Fact Sheet for Healthcare Providers:  IncredibleEmployment.be  This test is no t yet approved or cleared by the [place] FDA and  has been authorized for detection and/or diagnosis of SARS-CoV-2 by FDA under an Emergency Use Authorization (EUA). This EUA will remain  in effect (meaning this test can be used) for the duration of the COVID-19 declaration under Section 564(b)(1) of the Act,  21 U.S.C.section 360bbb-3(b)(1), unless the authorization is terminated  or revoked sooner.       Influenza A by PCR NEGATIVE NEGATIVE Final   Influenza B by PCR NEGATIVE NEGATIVE Final    Comment: (NOTE) The Xpert Xpress SARS-CoV-2/FLU/RSV plus assay is intended as an aid in the diagnosis of influenza from Nasopharyngeal swab specimens and should not be used as a sole basis for treatment. Nasal washings and aspirates are unacceptable for Xpert Xpress SARS-CoV-2/FLU/RSV testing.  Fact Sheet for Patients: EntrepreneurPulse.com.au  Fact Sheet for Healthcare Providers: IncredibleEmployment.be  This test is not yet approved or cleared by the [place] FDA and has been authorized for detection and/or diagnosis of SARS-CoV-2 by FDA under an Emergency Use Authorization (EUA). This EUA will remain in effect (meaning this test can be used) for the duration of the COVID-19 declaration under Section 564(b)(1) of the Act, 21 U.S.C. section 360bbb-3(b)(1), unless the authorization is terminated or revoked.  Performed at [facility], [address] [place], [place] [address]   MRSA Next Gen by PCR, Nasal     Status: None   Collection Time: 12/14/20 11:52 PM   Specimen: Nasal Mucosa; Nasal Swab  Result Value Ref Range Status   MRSA by PCR Next Gen NOT DETECTED NOT DETECTED Final    Comment: (NOTE) The GeneXpert MRSA Assay (FDA approved for NASAL specimens only), is one component of a comprehensive MRSA colonization surveillance program. It is not intended to diagnose MRSA infection nor to guide or monitor treatment for MRSA infections. Test performance is not FDA approved in patients less than 37 years old. Performed at [facility], [address], [place], [place] [address]     Radiology Studies: No results found.  Scheduled Meds:  amLODipine  10 mg Per Tube Daily   Chlorhexidine Gluconate Cloth  6 each Topical Daily    enoxaparin (LOVENOX) injection  40 mg Subcutaneous Q24H   feeding supplement (OSMOLITE 1.2 CAL)  355 mL Per Tube QID   feeding supplement (PROSource TF)  45 mL Per Tube BID   free water  200 mL Per Tube Q4H   guaiFENesin  10 mL Per Tube Q8H   insulin aspart  0-9 Units Subcutaneous Q4H   levETIRAcetam  750 mg Per Tube BID   levothyroxine  100 mcg Per Tube Q0600   polyethylene glycol  17 g Per Tube BID   QUEtiapine  50 mg Per Tube BID   senna  1 tablet Per Tube Daily   Continuous Infusions:   LOS: 4 days   Time spent: 25 mins.  Edwin Dada, MD [facility]   If 7PM-7AM, please contact night-coverage

## 2020-12-19 DIAGNOSIS — J9601 Acute respiratory failure with hypoxia: Secondary | ICD-10-CM | POA: Diagnosis not present

## 2020-12-19 LAB — GLUCOSE, CAPILLARY
Glucose-Capillary: 117 mg/dL — ABNORMAL HIGH (ref 70–99)
Glucose-Capillary: 130 mg/dL — ABNORMAL HIGH (ref 70–99)
Glucose-Capillary: 160 mg/dL — ABNORMAL HIGH (ref 70–99)
Glucose-Capillary: 167 mg/dL — ABNORMAL HIGH (ref 70–99)
Glucose-Capillary: 85 mg/dL (ref 70–99)
Glucose-Capillary: 91 mg/dL (ref 70–99)

## 2020-12-19 MED ORDER — IPRATROPIUM-ALBUTEROL 0.5-2.5 (3) MG/3ML IN SOLN
3.0000 mL | RESPIRATORY_TRACT | Status: DC | PRN
  Administered 2020-12-31 – 2021-02-18 (×78): 3 mL via RESPIRATORY_TRACT
  Filled 2020-12-19 (×79): qty 3

## 2020-12-19 MED ORDER — DEXTROSE 10 % IV SOLN: INTRAVENOUS | Status: DC

## 2020-12-19 MED ORDER — BISACODYL 10 MG RE SUPP: 10.0000 mg | Freq: Every day | RECTAL | Status: DC | PRN

## 2020-12-19 MED ORDER — BISACODYL 5 MG PO TBEC: 5.0000 mg | DELAYED_RELEASE_TABLET | Freq: Every day | ORAL | Status: DC | PRN

## 2020-12-19 NOTE — Progress Notes (Signed)
PROGRESS NOTE    Stacy Moran  IOE:[id number] DOB: [birth year] DOA: 12/14/2020 PCP: [name], MD    Brief Narrative:  [name] is a 65 year old F with history bilateral vocal cord paralysis, chronic tracheostomy and PEG, HTN, history of subdural hematoma, subarachnoid hematoma, epilepsy, hypothyroidism, and VP shunt for recurrent hydrocephalus who presented from her SNF due to respiratory distress.  Evidently patient's tracheostomy tube become dislodged accidentally, and she was in severe respiratory distress.  She was emergently taken to the OR, intubated under tracheostomy tube was replaced by ENT, Dr. Pryor Ochoa.         Assessment & Plan:     Acute respiratory failure with hypoxia due to displaced tracheostomy in the setting of chronic bilateral vocal cord dysfunction See progress note from 9/20.    ENT consulted, patient underwent tracheostomy replacement in the OR.  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  Remained stable for discharge - May have as needed albuterol   Dysphagia due to bilateral vocal cord dysfunction - Continue tube feeds    Seizures No seizures since admission - Continue Keppra  History of subarachnoid hemorrhage History of subdural hematoma Recurrent hydrocephalus History of VP shunt  - Continue Seroquel  Hypertension Blood pressure normal - Continue amlodipine  Postoperative hypothyroidism - Continue levothyroxine  Type 2 diabetes without complication Glucose controlled - Continue sliding scale corrections         DVT prophylaxis: Lovenox Code Status: Full code Family Communication: No family at bedside Disposition Plan:    Status is: Inpatient  Remains inpatient appropriate because:Unsafe d/c plan  Dispo: The patient is from: SNF              Anticipated d/c is to: SNF              Patient currently is medically stable to d/c.   Difficult to place patient No       Patient was admitted due to tracheostomy malfunction.  This has been rectified, she is now stable for discharge.    Consultants:  PCCM ENT  Procedures: Replacement of tracheostomy tube.  Antimicrobials:   Anti-infectives (From admission, onward)    None       Subjective: Feels well.  No fever, pain, respiratory distress.     Objective: Vitals:   12/19/20 0013 12/19/20 0351 12/19/20 0500 12/19/20 0831  BP: (!) 94/58 108/72  117/75  Pulse: 80 66  74  Resp: 17 17  20   Temp: 99.2 F (37.3 C) 98.5 F (36.9 C)  98.6 F (37 C)  TempSrc:  Oral    SpO2: 100% 100%  99%  Weight:   58.9 kg   Height:        Intake/Output Summary (Last 24 hours) at 12/19/2020 1033 Last data filed at 12/19/2020 0407 Gross per 24 hour  Intake 1000 ml  Output --  Net 1000 ml    Filed Weights   12/17/20 0500 12/18/20 0456 12/19/20 0500  Weight: 54.3 kg 58.8 kg 58.9 kg    Examination:  General appearance: Adult female, sitting up in bed, interactive, pleasant HEENT: Tracheostomy with some clear thin secretions, no bleeding, surrounding cellulitis Skin:  Cardiac:   Respiratory:    Abdomen:   MSK:  Neuro: Awake and alert, phonation appears normal with her passive muir valve, Moves all extremities with normal strength and coordination, face symmetric Psych: Attention normal, affect normal, judgment insight appear normal    Data Reviewed: I have personally  reviewed following labs and imaging studies  CBC: Recent Labs  Lab 12/14/20 0225 12/15/20 0430 12/17/20 0425  WBC 10.6* 4.4 4.2  NEUTROABS 5.5 2.2  --   HGB 13.3 11.0* 11.4*  HCT 42.3 35.3* 35.5*  MCV 83.6 82.1 82.6  PLT 154 141* 010*   Basic Metabolic Panel: Recent Labs  Lab 12/14/20 0225 12/14/20 1020 12/15/20 0430 12/17/20 0425  NA 142 142 140 142  K 3.4* 4.2 3.7 4.0  CL 102 104 106 105  CO2 31 30 30  32  GLUCOSE 222* 113* 129* 75  BUN 23 20 18 23   CREATININE 0.66 0.67 0.58 0.55  CALCIUM 9.8 9.4 9.2 9.3   MG 2.3  --  2.3 2.4  PHOS 3.6  --  3.4 3.2   GFR: Estimated Creatinine Clearance: 66.9 mL/min (by C-G formula based on SCr of 0.55 mg/dL). Liver Function Tests: No results for input(s): AST, ALT, ALKPHOS, BILITOT, PROT, ALBUMIN in the last 168 hours. No results for input(s): LIPASE, AMYLASE in the last 168 hours. No results for input(s): AMMONIA in the last 168 hours. Coagulation Profile: Recent Labs  Lab 12/14/20 0340  INR 1.0   Cardiac Enzymes: No results for input(s): CKTOTAL, CKMB, CKMBINDEX, TROPONINI in the last 168 hours. BNP (last 3 results) No results for input(s): PROBNP in the last 8760 hours. HbA1C: No results for input(s): HGBA1C in the last 72 hours.  CBG: Recent Labs  Lab 12/18/20 1543 12/18/20 1936 12/19/20 0011 12/19/20 0350 12/19/20 0858  GLUCAP 88 182* 85 91 130*   Lipid Profile: No results for input(s): CHOL, HDL, LDLCALC, TRIG, CHOLHDL, LDLDIRECT in the last 72 hours. Thyroid Function Tests: No results for input(s): TSH, T4TOTAL, FREET4, T3FREE, THYROIDAB in the last 72 hours. Anemia Panel: No results for input(s): VITAMINB12, FOLATE, FERRITIN, TIBC, IRON, RETICCTPCT in the last 72 hours. Sepsis Labs: No results for input(s): PROCALCITON, LATICACIDVEN in the last 168 hours.  Recent Results (from the past 240 hour(s))  Resp Panel by RT-PCR (Flu A&B, Covid) Nasopharyngeal Swab     Status: None   Collection Time: 12/14/20  2:25 AM   Specimen: Nasopharyngeal Swab; Nasopharyngeal(NP) swabs in vial transport medium  Result Value Ref Range Status   SARS Coronavirus 2 by RT PCR NEGATIVE NEGATIVE Final    Comment: (NOTE) SARS-CoV-2 target nucleic acids are NOT DETECTED.  The SARS-CoV-2 RNA is generally detectable in upper respiratory specimens during the acute phase of infection. The lowest concentration of SARS-CoV-2 viral copies this assay can detect is 138 copies/mL. A negative result does not preclude SARS-Cov-2 infection and should not be used  as the sole basis for treatment or other patient management decisions. A negative result may occur with  improper specimen collection/handling, submission of specimen other than nasopharyngeal swab, presence of viral mutation(s) within the areas targeted by this assay, and inadequate number of viral copies(<138 copies/mL). A negative result must be combined with clinical observations, patient history, and epidemiological information. The expected result is Negative.  Fact Sheet for Patients:  EntrepreneurPulse.com.au  Fact Sheet for Healthcare Providers:  IncredibleEmployment.be  This test is no t yet approved or cleared by the [place] FDA and  has been authorized for detection and/or diagnosis of SARS-CoV-2 by FDA under an Emergency Use Authorization (EUA). This EUA will remain  in effect (meaning this test can be used) for the duration of the COVID-19 declaration under Section 564(b)(1) of the Act, 21 U.S.C.section 360bbb-3(b)(1), unless the authorization is terminated  or revoked sooner.  Influenza A by PCR NEGATIVE NEGATIVE Final   Influenza B by PCR NEGATIVE NEGATIVE Final    Comment: (NOTE) The Xpert Xpress SARS-CoV-2/FLU/RSV plus assay is intended as an aid in the diagnosis of influenza from Nasopharyngeal swab specimens and should not be used as a sole basis for treatment. Nasal washings and aspirates are unacceptable for Xpert Xpress SARS-CoV-2/FLU/RSV testing.  Fact Sheet for Patients: EntrepreneurPulse.com.au  Fact Sheet for Healthcare Providers: IncredibleEmployment.be  This test is not yet approved or cleared by the [place] FDA and has been authorized for detection and/or diagnosis of SARS-CoV-2 by FDA under an Emergency Use Authorization (EUA). This EUA will remain in effect (meaning this test can be used) for the duration of the COVID-19 declaration under Section 564(b)(1)  of the Act, 21 U.S.C. section 360bbb-3(b)(1), unless the authorization is terminated or revoked.  Performed at [facility], [address] [place], [place] [address]   MRSA Next Gen by PCR, Nasal     Status: None   Collection Time: 12/14/20 11:52 PM   Specimen: Nasal Mucosa; Nasal Swab  Result Value Ref Range Status   MRSA by PCR Next Gen NOT DETECTED NOT DETECTED Final    Comment: (NOTE) The GeneXpert MRSA Assay (FDA approved for NASAL specimens only), is one component of a comprehensive MRSA colonization surveillance program. It is not intended to diagnose MRSA infection nor to guide or monitor treatment for MRSA infections. Test performance is not FDA approved in patients less than 32 years old. Performed at [facility], [address], [place], [place] [address]     Radiology Studies: No results found.  Scheduled Meds:  amLODipine  10 mg Per Tube Daily   Chlorhexidine Gluconate Cloth  6 each Topical Daily   enoxaparin (LOVENOX) injection  40 mg Subcutaneous Q24H   feeding supplement (OSMOLITE 1.2 CAL)  355 mL Per Tube QID   feeding supplement (PROSource TF)  45 mL Per Tube BID   free water  200 mL Per Tube Q4H   guaiFENesin  10 mL Per Tube Q8H   insulin aspart  0-9 Units Subcutaneous Q4H   levETIRAcetam  750 mg Per Tube BID   levothyroxine  100 mcg Per Tube Q0600   polyethylene glycol  17 g Per Tube BID   QUEtiapine  50 mg Per Tube BID   senna  1 tablet Per Tube Daily   Continuous Infusions:   LOS: 5 days   Time spent: 25 minutes    Edwin Dada, MD [facility]   If 7PM-7AM, please contact night-coverage

## 2020-12-19 NOTE — Progress Notes (Signed)
Nutrition Follow-up  DOCUMENTATION CODES:   Severe malnutrition in context of chronic illness  INTERVENTION:   -Continue 355 ml (1.5 cartons) of Osmolite 1.2 QID via PEG - 45 ml Prosource TF BID via PEG - 200 ml free water every 4 hours via PEG   Complete regimen provides 1784 kcals, 100 grams protein, and 1964 ml free water daily, meeting 100% of estimated kcal and protein needs.  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  Patient was found in respiratory distress in her room with a tracheostomy tube on the floor.  This is her fifth visit to the [facility] ED due to tracheostomy complications in 1 month.  9/18 - Taken emergently to OR for mechanical intubation by ENT after tracheostomy malfunction. Admitted to ICU requiring mechanical ventilatory support  Reviewed I/O's: +1 L x 24 hours and +2.7 L since admission  Per SLP notes, plan to continue NPO status.   Pt remains on trach collar. She was sleeping soundly at time of visit and RD did not disturb. No family at bedside.   Pt tolerating bolus TF well.   Per TOC notes, pt awaiting SNF placement.   Medications reviewed miralax and  senokot  Labs reviewed: CBGS: 85-182 (inpatient orders for glycemic control are 0-9 units insulin aspart every 4 hours).    Diet Order:   Diet Order             Diet NPO time specified  Diet effective now                   EDUCATION NEEDS:   Education needs have been addressed  Skin:  Skin Assessment: Reviewed RN Assessment  Last BM:  12/17/20  Height:   Ht Readings from Last 1 Encounters:  12/14/20 5' 6"  (1.676 m)    Weight:   Wt Readings from Last 1 Encounters:  12/19/20 58.9 kg   BMI:  Body mass index is 20.96 kg/m.  Estimated Nutritional Needs:   Kcal:  7207-2182  Protein:  85-100 grams  Fluid:  >1.75 L    [name], RD, LDN, [place] Registered Dietitian II Certified Diabetes Care and Education Specialist Please refer to [facility] for RD and/or RD on-call/weekend/after hours pager

## 2020-12-19 NOTE — TOC Progression Note (Signed)
Transition of Care [facility]) - Progression Note    Patient Details  Name: Stacy Moran MRN: [id number] Date of Birth: [birth year]  [facility] CM/SW [place], RN Phone Number: 12/19/2020, 10:31 AM  Clinical Narrative:   [facility].  She will call back.    Expected Discharge Plan: [place] Barriers to Discharge: No SNF bed  Expected Discharge Plan and Services Expected Discharge Plan: [place]   Discharge Planning Services: CM Consult Post Acute Care Choice: [place] Living arrangements for the past 2 months: [place]                 DME Arranged: N/A DME Agency: NA       HH Arranged: NA HH Agency: NA         Social Determinants of Health (SDOH) Interventions    Readmission Risk Interventions No flowsheet data found.

## 2020-12-20 DIAGNOSIS — J9601 Acute respiratory failure with hypoxia: Secondary | ICD-10-CM | POA: Diagnosis not present

## 2020-12-20 LAB — GLUCOSE, CAPILLARY
Glucose-Capillary: 108 mg/dL — ABNORMAL HIGH (ref 70–99)
Glucose-Capillary: 157 mg/dL — ABNORMAL HIGH (ref 70–99)
Glucose-Capillary: 76 mg/dL (ref 70–99)
Glucose-Capillary: 91 mg/dL (ref 70–99)
Glucose-Capillary: 93 mg/dL (ref 70–99)
Glucose-Capillary: 94 mg/dL (ref 70–99)

## 2020-12-20 NOTE — Progress Notes (Signed)
PROGRESS NOTE    Stacy Moran  YDX:[id number] DOB: 10-19-1955 DOA: 12/14/2020 PCP: [name], MD    Brief Narrative:  [name] is a 65 year old F with history bilateral vocal cord paralysis, chronic tracheostomy and PEG, HTN, history of subdural hematoma, subarachnoid hematoma, epilepsy, hypothyroidism, and VP shunt for recurrent hydrocephalus who presented from her SNF due to respiratory distress.  Evidently patient's tracheostomy tube become dislodged accidentally, and she was in severe respiratory distress.    On arrival, she was emergently taken to the OR, intubated and her tracheostomy tube was replaced by ENT, Dr. Pryor Ochoa.              Assessment & Plan:   Acute respiratory failure with hypoxia due to displaced tracheostomy in the setting of chronic bilateral vocal cord dysfunction See progress note from 9/20.    ENT consulted, patient underwent ET tube replacement in the OR.  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  She has had teaching from RT on her new trach.  Remains stable for discharge - May have as needed albuterol - Follow up with ENT Dr. Pryor Ochoa in 1 month   Dysphagia due to bilateral vocal cord dysfunction - Continue tube feeds    Seizures No seizures since admission - Continue Keppra  History of subarachnoid hemorrhage History of subdural hematoma Recurrent hydrocephalus History of VP shunt   Mood disorder - Continue Seroquel  Hypertension Blood pressure controlled - Continue amlodipine  Postoperative hypothyroidism - Continue levothyroxine  Type 2 diabetes without complication Glucose controlled - Continue sliding scale corrections         DVT prophylaxis: Lovenox Code Status: Full code Family Communication: No family at bedside Disposition Plan:    Status is: Inpatient  Remains inpatient appropriate because:Unsafe d/c plan  Dispo: The patient is from: SNF               Anticipated d/c is to: SNF              Patient currently is medically stable to d/c.   Difficult to place patient No      Patient was admitted due to tracheostomy malfunction.  This has been rectified, she is now stable for discharge.    Consultants:  PCCM ENT  Procedures: Replacement of tracheostomy tube.  Antimicrobials:   Anti-infectives (From admission, onward)    None       Subjective: No complaints.  No fever.  No respiratory distress, no increase in secretions, no pain.  Objective: Vitals:   12/20/20 0021 12/20/20 0455 12/20/20 0459 12/20/20 0830  BP: 90/61 122/83  128/78  Pulse: 77 70  62  Resp: 16 16    Temp: 98.7 F (37.1 C) 98.4 F (36.9 C)  98 F (36.7 C)  TempSrc:    Axillary  SpO2: 100% 99%  100%  Weight:   60.1 kg   Height:        Intake/Output Summary (Last 24 hours) at 12/20/2020 1135 Last data filed at 12/20/2020 7209 Gross per 24 hour  Intake 800 ml  Output 0 ml  Net 800 ml    Filed Weights   12/18/20 0456 12/19/20 0500 12/20/20 0459  Weight: 58.8 kg 58.9 kg 60.1 kg    Examination:  General appearance: Elderly adult female, lying in bed, no acute distress, interactive     HEENT: Tracheostomy well situated, clear secretions, moderate amount, no bleeding or surrounding cellulitis Cardiac:   Respiratory:  Abdomen:  MSK:  Neuro: Awake and alert, phonation normal with her Passy-Muir valve, moves upper extremities with normal strength and coordination, face symmetric Psych: Attention normal, affect appropriate, judgment insight normal       Data Reviewed: I have personally reviewed following labs and imaging studies  CBC: Recent Labs  Lab 12/14/20 0225 12/15/20 0430 12/17/20 0425  WBC 10.6* 4.4 4.2  NEUTROABS 5.5 2.2  --   HGB 13.3 11.0* 11.4*  HCT 42.3 35.3* 35.5*  MCV 83.6 82.1 82.6  PLT 154 141* 767*   Basic Metabolic Panel: Recent Labs  Lab 12/14/20 0225 12/14/20 1020 12/15/20 0430 12/17/20 0425  NA 142  142 140 142  K 3.4* 4.2 3.7 4.0  CL 102 104 106 105  CO2 31 30 30  32  GLUCOSE 222* 113* 129* 75  BUN 23 20 18 23   CREATININE 0.66 0.67 0.58 0.55  CALCIUM 9.8 9.4 9.2 9.3  MG 2.3  --  2.3 2.4  PHOS 3.6  --  3.4 3.2   GFR: Estimated Creatinine Clearance: 67.4 mL/min (by C-G formula based on SCr of 0.55 mg/dL). Liver Function Tests: No results for input(s): AST, ALT, ALKPHOS, BILITOT, PROT, ALBUMIN in the last 168 hours. No results for input(s): LIPASE, AMYLASE in the last 168 hours. No results for input(s): AMMONIA in the last 168 hours. Coagulation Profile: Recent Labs  Lab 12/14/20 0340  INR 1.0   Cardiac Enzymes: No results for input(s): CKTOTAL, CKMB, CKMBINDEX, TROPONINI in the last 168 hours. BNP (last 3 results) No results for input(s): PROBNP in the last 8760 hours. HbA1C: No results for input(s): HGBA1C in the last 72 hours.  CBG: Recent Labs  Lab 12/19/20 1544 12/19/20 2007 12/20/20 0019 12/20/20 0457 12/20/20 0829  GLUCAP 117* 167* 108* 93 76   Lipid Profile: No results for input(s): CHOL, HDL, LDLCALC, TRIG, CHOLHDL, LDLDIRECT in the last 72 hours. Thyroid Function Tests: No results for input(s): TSH, T4TOTAL, FREET4, T3FREE, THYROIDAB in the last 72 hours. Anemia Panel: No results for input(s): VITAMINB12, FOLATE, FERRITIN, TIBC, IRON, RETICCTPCT in the last 72 hours. Sepsis Labs: No results for input(s): PROCALCITON, LATICACIDVEN in the last 168 hours.  Recent Results (from the past 240 hour(s))  Resp Panel by RT-PCR (Flu A&B, Covid) Nasopharyngeal Swab     Status: None   Collection Time: 12/14/20  2:25 AM   Specimen: Nasopharyngeal Swab; Nasopharyngeal(NP) swabs in vial transport medium  Result Value Ref Range Status   SARS Coronavirus 2 by RT PCR NEGATIVE NEGATIVE Final    Comment: (NOTE) SARS-CoV-2 target nucleic acids are NOT DETECTED.  The SARS-CoV-2 RNA is generally detectable in upper respiratory specimens during the acute phase of  infection. The lowest concentration of SARS-CoV-2 viral copies this assay can detect is 138 copies/mL. A negative result does not preclude SARS-Cov-2 infection and should not be used as the sole basis for treatment or other patient management decisions. A negative result may occur with  improper specimen collection/handling, submission of specimen other than nasopharyngeal swab, presence of viral mutation(s) within the areas targeted by this assay, and inadequate number of viral copies(<138 copies/mL). A negative result must be combined with clinical observations, patient history, and epidemiological information. The expected result is Negative.  Fact Sheet for Patients:  EntrepreneurPulse.com.au  Fact Sheet for Healthcare Providers:  IncredibleEmployment.be  This test is no t yet approved or cleared by the [place] FDA and  has been authorized for detection and/or diagnosis of SARS-CoV-2 by FDA under an Emergency Use  Authorization (EUA). This EUA will remain  in effect (meaning this test can be used) for the duration of the COVID-19 declaration under Section 564(b)(1) of the Act, 21 U.S.C.section 360bbb-3(b)(1), unless the authorization is terminated  or revoked sooner.       Influenza A by PCR NEGATIVE NEGATIVE Final   Influenza B by PCR NEGATIVE NEGATIVE Final    Comment: (NOTE) The Xpert Xpress SARS-CoV-2/FLU/RSV plus assay is intended as an aid in the diagnosis of influenza from Nasopharyngeal swab specimens and should not be used as a sole basis for treatment. Nasal washings and aspirates are unacceptable for Xpert Xpress SARS-CoV-2/FLU/RSV testing.  Fact Sheet for Patients: EntrepreneurPulse.com.au  Fact Sheet for Healthcare Providers: IncredibleEmployment.be  This test is not yet approved or cleared by the [place] FDA and has been authorized for detection and/or diagnosis of SARS-CoV-2  by FDA under an Emergency Use Authorization (EUA). This EUA will remain in effect (meaning this test can be used) for the duration of the COVID-19 declaration under Section 564(b)(1) of the Act, 21 U.S.C. section 360bbb-3(b)(1), unless the authorization is terminated or revoked.  Performed at [facility], [address] [place], [place] [address]   MRSA Next Gen by PCR, Nasal     Status: None   Collection Time: 12/14/20 11:52 PM   Specimen: Nasal Mucosa; Nasal Swab  Result Value Ref Range Status   MRSA by PCR Next Gen NOT DETECTED NOT DETECTED Final    Comment: (NOTE) The GeneXpert MRSA Assay (FDA approved for NASAL specimens only), is one component of a comprehensive MRSA colonization surveillance program. It is not intended to diagnose MRSA infection nor to guide or monitor treatment for MRSA infections. Test performance is not FDA approved in patients less than 37 years old. Performed at [facility], [address], [place],  [address]     Radiology Studies: No results found.  Scheduled Meds:  amLODipine  10 mg Per Tube Daily   Chlorhexidine Gluconate Cloth  6 each Topical Daily   enoxaparin (LOVENOX) injection  40 mg Subcutaneous Q24H   feeding supplement (OSMOLITE 1.2 CAL)  355 mL Per Tube QID   feeding supplement (PROSource TF)  45 mL Per Tube BID   free water  200 mL Per Tube Q4H   guaiFENesin  10 mL Per Tube Q8H   insulin aspart  0-9 Units Subcutaneous Q4H   levETIRAcetam  750 mg Per Tube BID   levothyroxine  100 mcg Per Tube Q0600   polyethylene glycol  17 g Per Tube BID   QUEtiapine  50 mg Per Tube BID   senna  1 tablet Per Tube Daily   Continuous Infusions:   LOS: 6 days   Time spent: 15 minutes    Edwin Dada, MD [facility]   If 7PM-7AM, please contact night-coverage

## 2020-12-21 DIAGNOSIS — J9601 Acute respiratory failure with hypoxia: Secondary | ICD-10-CM | POA: Diagnosis not present

## 2020-12-21 LAB — GLUCOSE, CAPILLARY
Glucose-Capillary: 85 mg/dL (ref 70–99)
Glucose-Capillary: 84 mg/dL (ref 70–99)
Glucose-Capillary: 85 mg/dL (ref 70–99)
Glucose-Capillary: 118 mg/dL — ABNORMAL HIGH (ref 70–99)
Glucose-Capillary: 144 mg/dL — ABNORMAL HIGH (ref 70–99)
Glucose-Capillary: 92 mg/dL (ref 70–99)

## 2020-12-21 MED ORDER — PROBIOTIC BIOGAIA/SOOTHE NICU ORAL SYRINGE: 5.0000 [drp] | Freq: Every day | ORAL | 0 refills | Status: DC

## 2020-12-21 MED ORDER — IPRATROPIUM-ALBUTEROL 0.5-2.5 (3) MG/3ML IN SOLN: 3.0000 mL | RESPIRATORY_TRACT | Status: DC | PRN

## 2020-12-21 MED ORDER — QUETIAPINE FUMARATE 50 MG PO TABS: 50.0000 mg | ORAL_TABLET | Freq: Two times a day (BID) | ORAL | 0 refills | Status: AC

## 2020-12-21 NOTE — Progress Notes (Signed)
PROGRESS NOTE    Stacy Moran  XBM:[id number] DOB: [birth year] DOA: 12/14/2020 PCP: [name], MD    Brief Narrative:  Stacy Moran is a 65 year old F with history bilateral vocal cord paralysis, chronic tracheostomy and PEG, HTN, history of subdural hematoma, subarachnoid hematoma, epilepsy, hypothyroidism, and VP shunt for recurrent hydrocephalus who presented from her SNF due to respiratory distress.  Evidently patient's tracheostomy tube become dislodged accidentally, and she was in severe respiratory distress.    On arrival, she was emergently taken to the OR, intubated and her tracheostomy tube was replaced by ENT, Dr. Pryor Ochoa.  She is subsequently stabilized.  Has been ready for discharge to skilled nursing facility.  However no bed has been available in any facility at this time.    Assessment & Plan:   Acute respiratory failure with hypoxia due to displaced tracheostomy in the setting of chronic bilateral vocal cord dysfunction ENT consulted, patient underwent ET tube replacement in the OR.  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 She has had teaching from RT on her new trach.  Remains stable for discharge - May have as needed albuterol - Follow up with ENT Dr. Pryor Ochoa in 1 month  Dysphagia due to bilateral vocal cord dysfunction - Continue tube feeds    Seizures No seizures since admission - Continue Keppra  History of subarachnoid hemorrhage History of subdural hematoma Recurrent hydrocephalus History of VP shunt   Mood disorder - Continue Seroquel  Essential hypertension Blood pressure is reasonably well controlled.  Continue amlodipine.  Postoperative hypothyroidism Continue levothyroxine  Type 2 diabetes mellitus without complication CBGs are reasonably well controlled.  Continue SSI.  Check labs in the morning.    DVT prophylaxis: Lovenox Code Status: Full code Family Communication: No family at  bedside Disposition Plan: SNF when bed is available   Status is: Inpatient  Remains inpatient appropriate because:Unsafe d/c plan  Dispo: The patient is from: SNF              Anticipated d/c is to: SNF              Patient currently is medically stable to d/c.   Difficult to place patient No     Consultants:  PCCM ENT  Procedures: Replacement of tracheostomy tube.  Antimicrobials:   Anti-infectives (From admission, onward)    None       Subjective: Denies any complaints.  Slept well overnight.  No pain issues.  Objective: Vitals:   12/20/20 1626 12/20/20 2015 12/21/20 0500 12/21/20 0544  BP: 138/89 117/76  119/88  Pulse: 82 62  65  Resp: 16 16  16   Temp: 98 F (36.7 C) 98.6 F (37 C)  97.6 F (36.4 C)  TempSrc:  Oral  Oral  SpO2:  100%  99%  Weight:   58.4 kg   Height:       No intake or output data in the 24 hours ending 12/21/20 0823   Filed Weights   12/19/20 0500 12/20/20 0459 12/21/20 0500  Weight: 58.9 kg 60.1 kg 58.4 kg    Examination:  General appearance: Awake alert.  In no distress Tracheostomy noted.   Resp: Clear to auscultation bilaterally.  Normal effort Cardio: S1-S2 is normal regular.  No S3-S4.  No rubs murmurs or bruit GI: Abdomen is soft.  Nontender nondistended.  Bowel sounds are present normal.  No masses organomegaly Extremities: No edema.       Data Reviewed: I have personally  reviewed following labs and imaging studies  CBC: Recent Labs  Lab 12/15/20 0430 12/17/20 0425  WBC 4.4 4.2  NEUTROABS 2.2  --   HGB 11.0* 11.4*  HCT 35.3* 35.5*  MCV 82.1 82.6  PLT 141* 129*    Basic Metabolic Panel: Recent Labs  Lab 12/14/20 1020 12/15/20 0430 12/17/20 0425  NA 142 140 142  K 4.2 3.7 4.0  CL 104 106 105  CO2 30 30 32  GLUCOSE 113* 129* 75  BUN 20 18 23   CREATININE 0.67 0.58 0.55  CALCIUM 9.4 9.2 9.3  MG  --  2.3 2.4  PHOS  --  3.4 3.2    GFR: Estimated Creatinine Clearance: 66.4 mL/min (by C-G formula  based on SCr of 0.55 mg/dL).   CBG: Recent Labs  Lab 12/20/20 1143 12/20/20 1625 12/20/20 2015 12/21/20 0129 12/21/20 0545  GLUCAP 157* 91 94 85 84     Recent Results (from the past 240 hour(s))  Resp Panel by RT-PCR (Flu A&B, Covid) Nasopharyngeal Swab     Status: None   Collection Time: 12/14/20  2:25 AM   Specimen: Nasopharyngeal Swab; Nasopharyngeal(NP) swabs in vial transport medium  Result Value Ref Range Status   SARS Coronavirus 2 by RT PCR NEGATIVE NEGATIVE Final    Comment: (NOTE) SARS-CoV-2 target nucleic acids are NOT DETECTED.  The SARS-CoV-2 RNA is generally detectable in upper respiratory specimens during the acute phase of infection. The lowest concentration of SARS-CoV-2 viral copies this assay can detect is 138 copies/mL. A negative result does not preclude SARS-Cov-2 infection and should not be used as the sole basis for treatment or other patient management decisions. A negative result may occur with  improper specimen collection/handling, submission of specimen other than nasopharyngeal swab, presence of viral mutation(s) within the areas targeted by this assay, and inadequate number of viral copies(<138 copies/mL). A negative result must be combined with clinical observations, patient history, and epidemiological information. The expected result is Negative.  Fact Sheet for Patients:  EntrepreneurPulse.com.au  Fact Sheet for Healthcare Providers:  IncredibleEmployment.be  This test is no t yet approved or cleared by the [place] FDA and  has been authorized for detection and/or diagnosis of SARS-CoV-2 by FDA under an Emergency Use Authorization (EUA). This EUA will remain  in effect (meaning this test can be used) for the duration of the COVID-19 declaration under Section 564(b)(1) of the Act, 21 U.S.C.section 360bbb-3(b)(1), unless the authorization is terminated  or revoked sooner.       Influenza A  by PCR NEGATIVE NEGATIVE Final   Influenza B by PCR NEGATIVE NEGATIVE Final    Comment: (NOTE) The Xpert Xpress SARS-CoV-2/FLU/RSV plus assay is intended as an aid in the diagnosis of influenza from Nasopharyngeal swab specimens and should not be used as a sole basis for treatment. Nasal washings and aspirates are unacceptable for Xpert Xpress SARS-CoV-2/FLU/RSV testing.  Fact Sheet for Patients: EntrepreneurPulse.com.au  Fact Sheet for Healthcare Providers: IncredibleEmployment.be  This test is not yet approved or cleared by the [place] FDA and has been authorized for detection and/or diagnosis of SARS-CoV-2 by FDA under an Emergency Use Authorization (EUA). This EUA will remain in effect (meaning this test can be used) for the duration of the COVID-19 declaration under Section 564(b)(1) of the Act, 21 U.S.C. section 360bbb-3(b)(1), unless the authorization is terminated or revoked.  Performed at [facility], [address] [place], [place] [address]   MRSA Next Gen by PCR, Nasal  Status: None   Collection Time: 12/14/20 11:52 PM   Specimen: Nasal Mucosa; Nasal Swab  Result Value Ref Range Status   MRSA by PCR Next Gen NOT DETECTED NOT DETECTED Final    Comment: (NOTE) The GeneXpert MRSA Assay (FDA approved for NASAL specimens only), is one component of a comprehensive MRSA colonization surveillance program. It is not intended to diagnose MRSA infection nor to guide or monitor treatment for MRSA infections. Test performance is not FDA approved in patients less than 25 years old. Performed at [facility], [address] [place], [place] [address]      Radiology Studies: No results found.  Scheduled Meds:  amLODipine  10 mg Per Tube Daily   Chlorhexidine Gluconate Cloth  6 each Topical Daily   enoxaparin (LOVENOX) injection  40 mg Subcutaneous Q24H   feeding supplement (OSMOLITE 1.2 CAL)  355 mL Per  Tube QID   feeding supplement (PROSource TF)  45 mL Per Tube BID   free water  200 mL Per Tube Q4H   guaiFENesin  10 mL Per Tube Q8H   insulin aspart  0-9 Units Subcutaneous Q4H   levETIRAcetam  750 mg Per Tube BID   levothyroxine  100 mcg Per Tube Q0600   polyethylene glycol  17 g Per Tube BID   QUEtiapine  50 mg Per Tube BID   senna  1 tablet Per Tube Daily   Continuous Infusions:   LOS: 7 days     Bonnielee Haff, MD [facility]   If 7PM-7AM, please contact night-coverage

## 2020-12-22 DIAGNOSIS — E162 Hypoglycemia, unspecified: Secondary | ICD-10-CM | POA: Diagnosis not present

## 2020-12-22 DIAGNOSIS — D696 Thrombocytopenia, unspecified: Secondary | ICD-10-CM

## 2020-12-22 LAB — GLUCOSE, CAPILLARY
Glucose-Capillary: 149 mg/dL — ABNORMAL HIGH (ref 70–99)
Glucose-Capillary: 175 mg/dL — ABNORMAL HIGH (ref 70–99)
Glucose-Capillary: 183 mg/dL — ABNORMAL HIGH (ref 70–99)
Glucose-Capillary: 200 mg/dL — ABNORMAL HIGH (ref 70–99)
Glucose-Capillary: 73 mg/dL (ref 70–99)
Glucose-Capillary: 89 mg/dL (ref 70–99)

## 2020-12-22 LAB — BASIC METABOLIC PANEL
Anion gap: 8 (ref 5–15)
BUN: 25 mg/dL — ABNORMAL HIGH (ref 8–23)
CO2: 28 mmol/L (ref 22–32)
Calcium: 9.4 mg/dL (ref 8.9–10.3)
Chloride: 106 mmol/L (ref 98–111)
Creatinine, Ser: 0.59 mg/dL (ref 0.44–1.00)
GFR, Estimated: >60 mL/min (ref >60)
Glucose, Bld: 60 mg/dL — ABNORMAL LOW (ref 70–99)
Potassium: 4.1 mmol/L (ref 3.5–5.1)
Sodium: 142 mmol/L (ref 135–145)

## 2020-12-22 LAB — CBC
HCT: 35.5 % — ABNORMAL LOW (ref 36.0–46.0)
Hemoglobin: 11.1 g/dL — ABNORMAL LOW (ref 12.0–15.0)
MCH: 25.8 pg — ABNORMAL LOW (ref 26.0–34.0)
MCHC: 31.3 g/dL (ref 30.0–36.0)
MCV: 82.6 fL (ref 80.0–100.0)
Platelets: 90 10*3/uL — ABNORMAL LOW (ref 150–400)
RBC: 4.3 MIL/uL (ref 3.87–5.11)
RDW: 13.9 % (ref 11.5–15.5)
WBC: 3.8 10*3/uL — ABNORMAL LOW (ref 4.0–10.5)
nRBC: 0 % (ref 0.0–0.2)

## 2020-12-22 NOTE — Progress Notes (Signed)
PROGRESS NOTE    Stacy Moran  BZJ:[id number] DOB: July 18, 1955 DOA: 12/14/2020 PCP: [name], MD    Brief Narrative:  Stacy Moran is a 65 year old F with history bilateral vocal cord paralysis, chronic tracheostomy and PEG, HTN, history of subdural hematoma, subarachnoid hematoma, epilepsy, hypothyroidism, and VP shunt for recurrent hydrocephalus who presented from her SNF due to respiratory distress.  Evidently patient's tracheostomy tube become dislodged accidentally, and she was in severe respiratory distress.    On arrival, she was emergently taken to the OR, intubated and her tracheostomy tube was replaced by ENT, Dr. Pryor Ochoa.  She is subsequently stabilized.  Has been ready for discharge to skilled nursing facility.  However no bed has been available in any facility at this time.    Assessment & Plan:   Acute respiratory failure with hypoxia due to displaced tracheostomy in the setting of chronic bilateral vocal cord dysfunction ENT consulted, patient underwent ET tube replacement in the OR.  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 She has had teaching from RT on her new trach.  Remains stable for discharge - May have as needed albuterol - Follow up with ENT Dr. Pryor Ochoa in 1 month  Dysphagia due to bilateral vocal cord dysfunction - Continue tube feeds  Hypoglycemia/type 2 diabetes mellitus without complications Low glucose levels noted on her metabolic panel this morning.  She is not receiving any oral hypoglycemic agents or long-acting insulin.  Just on SSI.  Continue to monitor for now.  We will discontinue the SSI.  Thrombocytopenia Drop in platelet counts noted to 90,000 today.  It was 129,000 on 9/21 and was 171,000 on 9/9.  She is noted to be on Lovenox which will be held for now.  We will recheck labs tomorrow depending on which a further work-up may be necessary.  We will review other medications which could be  responsible as well.    Seizures No seizures since admission - Continue Keppra  History of subarachnoid hemorrhage History of subdural hematoma Recurrent hydrocephalus History of VP shunt   Mood disorder - Continue Seroquel  Essential hypertension Blood pressure is reasonably well controlled.  Continue amlodipine.  Postoperative hypothyroidism Continue levothyroxine   DVT prophylaxis: Changed to SCDs today Code Status: Full code Family Communication: No family at bedside Disposition Plan: SNF when bed is available   Status is: Inpatient  Remains inpatient appropriate because:Unsafe d/c plan  Dispo: The patient is from: SNF              Anticipated d/c is to: SNF              Patient currently is medically stable to d/c.   Difficult to place patient No     Consultants:  PCCM ENT  Procedures: Replacement of tracheostomy tube.  Antimicrobials:   Anti-infectives (From admission, onward)    None       Subjective: Patient denies any complaints.  Noted to have some secretions this morning.  Discussed with nursing staff to do suctioning.  No bleeding noted.  Objective: Vitals:   12/22/20 0720 12/22/20 0820 12/22/20 0846 12/22/20 0942  BP: 129/78 119/82  (!) 122/94  Pulse: (!) 55 (!) 56 64 80  Resp: 17  18   Temp: 97.6 F (36.4 C)     TempSrc: Oral     SpO2: 100%  100%   Weight:      Height:       No intake or output data  in the 24 hours ending 12/22/20 1122   Filed Weights   12/20/20 0459 12/21/20 0500 12/22/20 0454  Weight: 60.1 kg 58.4 kg 58 kg    Examination:  General appearance: Awake alert.  In no distress Tracheostomy is noted Resp: Coarse breath sounds but mostly clear to auscultation bilaterally. Cardio: S1-S2 is normal regular.  No S3-S4.  No rubs murmurs or bruit GI: Abdomen is soft.  Nontender nondistended.  Bowel sounds are present normal.  No masses organomegaly.  PEG tube is noted. Extremities: No edema.      Data Reviewed: I  have personally reviewed following labs and imaging studies  CBC: Recent Labs  Lab 12/17/20 0425 12/22/20 0422  WBC 4.2 3.8*  HGB 11.4* 11.1*  HCT 35.5* 35.5*  MCV 82.6 82.6  PLT 129* 90*    Basic Metabolic Panel: Recent Labs  Lab 12/17/20 0425 12/22/20 0422  NA 142 142  K 4.0 4.1  CL 105 106  CO2 32 28  GLUCOSE 75 60*  BUN 23 25*  CREATININE 0.55 0.59  CALCIUM 9.3 9.4  MG 2.4  --   PHOS 3.2  --     GFR: Estimated Creatinine Clearance: 65.9 mL/min (by C-G formula based on SCr of 0.59 mg/dL).   CBG: Recent Labs  Lab 12/21/20 1704 12/21/20 2013 12/22/20 0007 12/22/20 0435 12/22/20 0811  GLUCAP 144* 92 200* 73 89     Recent Results (from the past 240 hour(s))  Resp Panel by RT-PCR (Flu A&B, Covid) Nasopharyngeal Swab     Status: None   Collection Time: 12/14/20  2:25 AM   Specimen: Nasopharyngeal Swab; Nasopharyngeal(NP) swabs in vial transport medium  Result Value Ref Range Status   SARS Coronavirus 2 by RT PCR NEGATIVE NEGATIVE Final    Comment: (NOTE) SARS-CoV-2 target nucleic acids are NOT DETECTED.  The SARS-CoV-2 RNA is generally detectable in upper respiratory specimens during the acute phase of infection. The lowest concentration of SARS-CoV-2 viral copies this assay can detect is 138 copies/mL. A negative result does not preclude SARS-Cov-2 infection and should not be used as the sole basis for treatment or other patient management decisions. A negative result may occur with  improper specimen collection/handling, submission of specimen other than nasopharyngeal swab, presence of viral mutation(s) within the areas targeted by this assay, and inadequate number of viral copies(<138 copies/mL). A negative result must be combined with clinical observations, patient history, and epidemiological information. The expected result is Negative.  Fact Sheet for Patients:  EntrepreneurPulse.com.au  Fact Sheet for Healthcare Providers:   IncredibleEmployment.be  This test is no t yet approved or cleared by the [place] FDA and  has been authorized for detection and/or diagnosis of SARS-CoV-2 by FDA under an Emergency Use Authorization (EUA). This EUA will remain  in effect (meaning this test can be used) for the duration of the COVID-19 declaration under Section 564(b)(1) of the Act, 21 U.S.C.section 360bbb-3(b)(1), unless the authorization is terminated  or revoked sooner.       Influenza A by PCR NEGATIVE NEGATIVE Final   Influenza B by PCR NEGATIVE NEGATIVE Final    Comment: (NOTE) The Xpert Xpress SARS-CoV-2/FLU/RSV plus assay is intended as an aid in the diagnosis of influenza from Nasopharyngeal swab specimens and should not be used as a sole basis for treatment. Nasal washings and aspirates are unacceptable for Xpert Xpress SARS-CoV-2/FLU/RSV testing.  Fact Sheet for Patients: EntrepreneurPulse.com.au  Fact Sheet for Healthcare Providers: IncredibleEmployment.be  This test is not yet approved  or cleared by the [place] and has been authorized for detection and/or diagnosis of SARS-CoV-2 by FDA under an Emergency Use Authorization (EUA). This EUA will remain in effect (meaning this test can be used) for the duration of the COVID-19 declaration under Section 564(b)(1) of the Act, 21 U.S.C. section 360bbb-3(b)(1), unless the authorization is terminated or revoked.  Performed at [facility], [address] [place], [place] [address]   MRSA Next Gen by PCR, Nasal     Status: None   Collection Time: 12/14/20 11:52 PM   Specimen: Nasal Mucosa; Nasal Swab  Result Value Ref Range Status   MRSA by PCR Next Gen NOT DETECTED NOT DETECTED Final    Comment: (NOTE) The GeneXpert MRSA Assay (FDA approved for NASAL specimens only), is one component of a comprehensive MRSA colonization surveillance program. It is not intended to  diagnose MRSA infection nor to guide or monitor treatment for MRSA infections. Test performance is not FDA approved in patients less than 34 years old. Performed at [facility], [address] [place], [place] [address]      Radiology Studies: No results found.  Scheduled Meds:  amLODipine  10 mg Per Tube Daily   Chlorhexidine Gluconate Cloth  6 each Topical Daily   feeding supplement (OSMOLITE 1.2 CAL)  355 mL Per Tube QID   feeding supplement (PROSource TF)  45 mL Per Tube BID   free water  200 mL Per Tube Q4H   guaiFENesin  10 mL Per Tube Q8H   insulin aspart  0-9 Units Subcutaneous Q4H   levETIRAcetam  750 mg Per Tube BID   levothyroxine  100 mcg Per Tube Q0600   polyethylene glycol  17 g Per Tube BID   QUEtiapine  50 mg Per Tube BID   senna  1 tablet Per Tube Daily   Continuous Infusions:   LOS: 8 days     Bonnielee Haff, MD [facility]   If 7PM-7AM, please contact night-coverage

## 2020-12-23 ENCOUNTER — Telehealth: Payer: Self-pay | Admitting: Internal Medicine

## 2020-12-23 DIAGNOSIS — E162 Hypoglycemia, unspecified: Secondary | ICD-10-CM | POA: Diagnosis not present

## 2020-12-23 DIAGNOSIS — D696 Thrombocytopenia, unspecified: Secondary | ICD-10-CM | POA: Diagnosis not present

## 2020-12-23 LAB — GLUCOSE, CAPILLARY
Glucose-Capillary: 103 mg/dL — ABNORMAL HIGH (ref 70–99)
Glucose-Capillary: 148 mg/dL — ABNORMAL HIGH (ref 70–99)
Glucose-Capillary: 80 mg/dL (ref 70–99)
Glucose-Capillary: 99 mg/dL (ref 70–99)

## 2020-12-23 LAB — CBC
HCT: 35 % — ABNORMAL LOW (ref 36.0–46.0)
Hemoglobin: 11 g/dL — ABNORMAL LOW (ref 12.0–15.0)
MCH: 25.8 pg — ABNORMAL LOW (ref 26.0–34.0)
MCHC: 31.4 g/dL (ref 30.0–36.0)
MCV: 82 fL (ref 80.0–100.0)
Platelets: 123 10*3/uL — ABNORMAL LOW (ref 150–400)
RBC: 4.27 MIL/uL (ref 3.87–5.11)
RDW: 13.9 % (ref 11.5–15.5)
WBC: 3.7 10*3/uL — ABNORMAL LOW (ref 4.0–10.5)
nRBC: 0 % (ref 0.0–0.2)

## 2020-12-23 NOTE — Telephone Encounter (Signed)
Noted  

## 2020-12-23 NOTE — TOC Progression Note (Signed)
Transition of Care [facility]) - Progression Note    Patient Details  Name: Stacy Moran MRN: [id number] Date of Birth: 01/27/1956  Transition of Care [facility]) CM/SW [place], LCSW Phone Number: 12/23/2020, 9:47 AM  Clinical Narrative:  [facility] is unable to offer a bed. No bed offers at this time. Expanded search.   Expected Discharge Plan: [place] Barriers to Discharge: No SNF bed  Expected Discharge Plan and Services Expected Discharge Plan: [place]   Discharge Planning Services: CM Consult Post Acute Care Choice: [place] Living arrangements for the past 2 months: [place]                 DME Arranged: N/A DME Agency: NA       HH Arranged: NA HH Agency: NA         Social Determinants of Health (SDOH) Interventions    Readmission Risk Interventions No flowsheet data found.

## 2020-12-23 NOTE — TOC Progression Note (Signed)
Transition of Care [facility]) - Progression Note    Patient Details  Name: Amalya Salmons MRN: [id number] Date of Birth: 1955-05-21  Transition of Care [facility]) CM/SW [employer], LCSW Phone Number: 12/23/2020, 9:06 AM  Clinical Narrative:   No bed offers at this time. Sent updated therapy notes to facilities with pending responses.  Expected Discharge Plan: [place] Barriers to Discharge: No SNF bed  Expected Discharge Plan and Services Expected Discharge Plan: [place]   Discharge Planning Services: CM Consult Post Acute Care Choice: [place] Living arrangements for the past 2 months: [place]                 DME Arranged: N/A DME Agency: NA       HH Arranged: NA HH Agency: NA         Social Determinants of Health (SDOH) Interventions    Readmission Risk Interventions No flowsheet data found.

## 2020-12-23 NOTE — Telephone Encounter (Signed)
Copied from [place] [id number]. Topic: General - Other >> Dec 17, 2020  4:11 PM Oneta Rack wrote: Reason for CRM:  Caller cancelled patient appointment scheduled for 12/24/2020 with Freeman Caldron do to patient being hospitalized. Caller wanted to inform PCP patient will be in the hospital for awhile.

## 2020-12-23 NOTE — Progress Notes (Signed)
PROGRESS NOTE    Lauralee Waters  EHU:[id number] DOB: [birth year] DOA: 12/14/2020 PCP: [name], MD    Brief Narrative:  [name] is a 65 year old F with history bilateral vocal cord paralysis, chronic tracheostomy and PEG, HTN, history of subdural hematoma, subarachnoid hematoma, epilepsy, hypothyroidism, and VP shunt for recurrent hydrocephalus who presented from her SNF due to respiratory distress.  Evidently patient's tracheostomy tube become dislodged accidentally, and she was in severe respiratory distress.    On arrival, she was emergently taken to the OR, intubated and her tracheostomy tube was replaced by ENT, Dr. Pryor Ochoa.  She is subsequently stabilized.  Has been ready for discharge to skilled nursing facility.  However no bed has been available in any facility at this time.    Assessment & Plan:   Acute respiratory failure with hypoxia due to displaced tracheostomy in the setting of chronic bilateral vocal cord dysfunction ENT consulted, patient underwent ET tube replacement in the OR.  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 She has had teaching from RT on her new trach.   May have as needed albuterol Follow up with ENT Dr. Pryor Ochoa in 1 month Remains stable from a respiratory standpoint.  Dysphagia due to bilateral vocal cord dysfunction Continue tube feeds.  Getting bolus feeds.  Hypoglycemia/type 2 diabetes mellitus without complications Noted to have low glucose levels on 9/26.  Her sliding scale coverage was discontinued.  She is likely very sensitive to insulin.  We will tolerate higher levels of glucose in this individual.  Continue to monitor CBGs, need not be more than 3-4 times a day.  HbA1c 6.2.  Thrombocytopenia Platelet counts noted to be 90,000 on 9/26.  It was 129,000 on 9/21 and was 171,000 on 9/9.   She was noted to be on Lovenox which was held.  Labs repeated today and shows improvement in platelet  counts.  We will continue just SCDs for now and leave her off of Lovenox.  Check CBC periodically.      Seizures No seizures since admission. Continue Keppra  History of subarachnoid hemorrhage History of subdural hematoma Recurrent hydrocephalus History of VP shunt   Mood disorder Continue Seroquel  Essential hypertension Blood pressure is reasonably well controlled.  Continue amlodipine.  Postoperative hypothyroidism Continue levothyroxine   DVT prophylaxis: SCDs only for now Code Status: Full code Family Communication: No family at bedside Disposition Plan: SNF when bed is available   Status is: Inpatient  Remains inpatient appropriate because:Unsafe d/c plan  Dispo: The patient is from: SNF              Anticipated d/c is to: SNF              Patient currently is medically stable to d/c.   Difficult to place patient No     Consultants:  PCCM ENT  Procedures: Replacement of tracheostomy tube.  Antimicrobials:   Anti-infectives (From admission, onward)    None       Subjective: Denies any complaints this morning.  Feels fine.  Objective: Vitals:   12/23/20 0523 12/23/20 0545 12/23/20 0750 12/23/20 0843  BP: 102/65  119/84   Pulse: 61  68 67  Resp: 18  18 18   Temp: 97.7 F (36.5 C)  98.3 F (36.8 C)   TempSrc: Oral     SpO2: 100% 100% 99% 97%  Weight:      Height:       No intake or output data  in the 24 hours ending 12/23/20 1020   Filed Weights   12/20/20 0459 12/21/20 0500 12/22/20 0454  Weight: 60.1 kg 58.4 kg 58 kg    Examination:  Awake and alert.  In no distress Tracheostomy is noted. Coarse breath sounds bilaterally.  No definite crackles wheezing or rhonchi S1-S2 is normal regular Abdomen is soft.  Nontender nondistended.  PEG tube is noted.    Data Reviewed: I have personally reviewed following labs and imaging studies  CBC: Recent Labs  Lab 12/17/20 0425 12/22/20 0422 12/23/20 0323  WBC 4.2 3.8* 3.7*  HGB 11.4*  11.1* 11.0*  HCT 35.5* 35.5* 35.0*  MCV 82.6 82.6 82.0  PLT 129* 90* 123*    Basic Metabolic Panel: Recent Labs  Lab 12/17/20 0425 12/22/20 0422  NA 142 142  K 4.0 4.1  CL 105 106  CO2 32 28  GLUCOSE 75 60*  BUN 23 25*  CREATININE 0.55 0.59  CALCIUM 9.3 9.4  MG 2.4  --   PHOS 3.2  --     GFR: Estimated Creatinine Clearance: 65.9 mL/min (by C-G formula based on SCr of 0.59 mg/dL).   CBG: Recent Labs  Lab 12/22/20 1635 12/22/20 1957 12/23/20 0008 12/23/20 0429 12/23/20 0752  GLUCAP 183* 175* 148* 99 80     Recent Results (from the past 240 hour(s))  Resp Panel by RT-PCR (Flu A&B, Covid) Nasopharyngeal Swab     Status: None   Collection Time: 12/14/20  2:25 AM   Specimen: Nasopharyngeal Swab; Nasopharyngeal(NP) swabs in vial transport medium  Result Value Ref Range Status   SARS Coronavirus 2 by RT PCR NEGATIVE NEGATIVE Final    Comment: (NOTE) SARS-CoV-2 target nucleic acids are NOT DETECTED.  The SARS-CoV-2 RNA is generally detectable in upper respiratory specimens during the acute phase of infection. The lowest concentration of SARS-CoV-2 viral copies this assay can detect is 138 copies/mL. A negative result does not preclude SARS-Cov-2 infection and should not be used as the sole basis for treatment or other patient management decisions. A negative result may occur with  improper specimen collection/handling, submission of specimen other than nasopharyngeal swab, presence of viral mutation(s) within the areas targeted by this assay, and inadequate number of viral copies(<138 copies/mL). A negative result must be combined with clinical observations, patient history, and epidemiological information. The expected result is Negative.  Fact Sheet for Patients:  EntrepreneurPulse.com.au  Fact Sheet for Healthcare Providers:  IncredibleEmployment.be  This test is no t yet approved or cleared by the [place] FDA and   has been authorized for detection and/or diagnosis of SARS-CoV-2 by FDA under an Emergency Use Authorization (EUA). This EUA will remain  in effect (meaning this test can be used) for the duration of the COVID-19 declaration under Section 564(b)(1) of the Act, 21 U.S.C.section 360bbb-3(b)(1), unless the authorization is terminated  or revoked sooner.       Influenza A by PCR NEGATIVE NEGATIVE Final   Influenza B by PCR NEGATIVE NEGATIVE Final    Comment: (NOTE) The Xpert Xpress SARS-CoV-2/FLU/RSV plus assay is intended as an aid in the diagnosis of influenza from Nasopharyngeal swab specimens and should not be used as a sole basis for treatment. Nasal washings and aspirates are unacceptable for Xpert Xpress SARS-CoV-2/FLU/RSV testing.  Fact Sheet for Patients: EntrepreneurPulse.com.au  Fact Sheet for Healthcare Providers: IncredibleEmployment.be  This test is not yet approved or cleared by the [place] FDA and has been authorized for detection and/or diagnosis of SARS-CoV-2 by FDA  under an Emergency Use Authorization (EUA). This EUA will remain in effect (meaning this test can be used) for the duration of the COVID-19 declaration under Section 564(b)(1) of the Act, 21 U.S.C. section 360bbb-3(b)(1), unless the authorization is terminated or revoked.  Performed at [facility], [address] [place], [place] [id number]   MRSA Next Gen by PCR, Nasal     Status: None   Collection Time: 12/14/20 11:52 PM   Specimen: Nasal Mucosa; Nasal Swab  Result Value Ref Range Status   MRSA by PCR Next Gen NOT DETECTED NOT DETECTED Final    Comment: (NOTE) The GeneXpert MRSA Assay (FDA approved for NASAL specimens only), is one component of a comprehensive MRSA colonization surveillance program. It is not intended to diagnose MRSA infection nor to guide or monitor treatment for MRSA infections. Test performance is not FDA approved in  patients less than 42 years old. Performed at [facility], [address], [place], [place] [address]      Radiology Studies: No results found.  Scheduled Meds:  amLODipine  10 mg Per Tube Daily   Chlorhexidine Gluconate Cloth  6 each Topical Daily   feeding supplement (OSMOLITE 1.2 CAL)  355 mL Per Tube QID   feeding supplement (PROSource TF)  45 mL Per Tube BID   free water  200 mL Per Tube Q4H   guaiFENesin  10 mL Per Tube Q8H   levETIRAcetam  750 mg Per Tube BID   levothyroxine  100 mcg Per Tube Q0600   polyethylene glycol  17 g Per Tube BID   QUEtiapine  50 mg Per Tube BID   senna  1 tablet Per Tube Daily   Continuous Infusions:   LOS: 9 days     Bonnielee Haff, MD [facility]   If 7PM-7AM, please contact night-coverage

## 2020-12-24 ENCOUNTER — Ambulatory Visit: Payer: Medicaid Other | Admitting: Physician Assistant

## 2020-12-24 DIAGNOSIS — E162 Hypoglycemia, unspecified: Secondary | ICD-10-CM | POA: Diagnosis not present

## 2020-12-24 DIAGNOSIS — D696 Thrombocytopenia, unspecified: Secondary | ICD-10-CM | POA: Diagnosis not present

## 2020-12-24 LAB — GLUCOSE, CAPILLARY
Glucose-Capillary: 137 mg/dL — ABNORMAL HIGH (ref 70–99)
Glucose-Capillary: 82 mg/dL (ref 70–99)

## 2020-12-24 NOTE — Progress Notes (Signed)
PROGRESS NOTE    Stacy Moran  PRF:[id number] DOB: 1955/05/14 DOA: 12/14/2020 PCP: [name], MD    Brief Narrative:  [name] is a 65 year old F with history bilateral vocal cord paralysis, chronic tracheostomy and PEG, HTN, history of subdural hematoma, subarachnoid hematoma, epilepsy, hypothyroidism, and VP shunt for recurrent hydrocephalus who presented from her SNF due to respiratory distress.  Evidently patient's tracheostomy tube become dislodged accidentally, and she was in severe respiratory distress.    On arrival, she was emergently taken to the OR, intubated and her tracheostomy tube was replaced by ENT, Dr. Pryor Ochoa.  She is subsequently stabilized.  Has been ready for discharge to skilled nursing facility.  However no bed has been available in any facility at this time.    Assessment & Plan:   Acute respiratory failure with hypoxia due to displaced tracheostomy in the setting of chronic bilateral vocal cord dysfunction ENT consulted, patient underwent ET tube replacement in the OR.  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 She has had teaching from RT on her new trach.   May have as needed albuterol Follow up with ENT Dr. Pryor Ochoa in 1 month Patient remains stable from a respiratory standpoint.  Dysphagia due to bilateral vocal cord dysfunction Continue tube feeds.  Getting bolus feeds.  Hypoglycemia/type 2 diabetes mellitus without complications Noted to have low glucose levels on 9/26.  Her sliding scale coverage was discontinued.  She is likely very sensitive to insulin.  We will tolerate higher levels of glucose in this individual.  Continue to monitor CBGs, need not be more than 3-4 times a day.  HbA1c 6.2.  Thrombocytopenia Platelet counts noted to be 90,000 on 9/26.  It was 129,000 on 9/21 and was 171,000 on 9/9.   She was noted to be on Lovenox which was held.   Labs were repeated on 9/27 and platelet count was  noted to be better at 123,000.   We will continue just SCDs for now and leave her off of Lovenox.  Check CBC periodically.      Seizures No seizures since admission. Continue Keppra  History of subarachnoid hemorrhage History of subdural hematoma Recurrent hydrocephalus History of VP shunt   Mood disorder Continue Seroquel  Essential hypertension Blood pressure is reasonably well controlled.  Continue amlodipine.  Postoperative hypothyroidism Continue levothyroxine   DVT prophylaxis: SCDs only for now Code Status: Full code Family Communication: No family at bedside Disposition Plan: SNF when bed is available   Status is: Inpatient  Remains inpatient appropriate because:Unsafe d/c plan  Dispo: The patient is from: SNF              Anticipated d/c is to: SNF              Patient currently is medically stable to d/c.   Difficult to place patient No     Consultants:  PCCM ENT  Procedures: Replacement of tracheostomy tube.  Antimicrobials:   Anti-infectives (From admission, onward)    None       Subjective: Denies any complaints.  Feels fine.  Wondering why she cannot go back to her usual skilled nursing facility.  Was told that they are now not able to care for patients who have a tracheostomy.  Objective: Vitals:   12/24/20 0414 12/24/20 0414 12/24/20 0450 12/24/20 0744  BP: 129/88 129/88  135/86  Pulse: 77 77  68  Resp: 16 16  16   Temp: 98.8 F (37.1 C)  98.8 F (37.1 C)  98.1 F (36.7 C)  TempSrc: Oral Oral  Oral  SpO2: 100% 100% 100% 100%  Weight:      Height:        Intake/Output Summary (Last 24 hours) at 12/24/2020 0804 Last data filed at 12/24/2020 0600 Gross per 24 hour  Intake 5155 ml  Output --  Net 5155 ml     Filed Weights   12/20/20 0459 12/21/20 0500 12/22/20 0454  Weight: 60.1 kg 58.4 kg 58 kg    Examination:  She is awake alert.  In no distress. Tracheostomy is noted. Coarse breath sounds with occasional wheezing.  No  increased effort noted. S1-S2 is normal regular. Abdomen is soft.  Nontender nondistended.  PEG tube is noted.     Data Reviewed: I have personally reviewed following labs and imaging studies  CBC: Recent Labs  Lab 12/22/20 0422 12/23/20 0323  WBC 3.8* 3.7*  HGB 11.1* 11.0*  HCT 35.5* 35.0*  MCV 82.6 82.0  PLT 90* 123*    Basic Metabolic Panel: Recent Labs  Lab 12/22/20 0422  NA 142  K 4.1  CL 106  CO2 28  GLUCOSE 60*  BUN 25*  CREATININE 0.59  CALCIUM 9.4    GFR: Estimated Creatinine Clearance: 65.9 mL/min (by C-G formula based on SCr of 0.59 mg/dL).   CBG: Recent Labs  Lab 12/22/20 1957 12/23/20 0008 12/23/20 0429 12/23/20 0752 12/23/20 1208  GLUCAP 175* 148* 99 80 103*     Recent Results (from the past 240 hour(s))  MRSA Next Gen by PCR, Nasal     Status: None   Collection Time: 12/14/20 11:52 PM   Specimen: Nasal Mucosa; Nasal Swab  Result Value Ref Range Status   MRSA by PCR Next Gen NOT DETECTED NOT DETECTED Final    Comment: (NOTE) The GeneXpert MRSA Assay (FDA approved for NASAL specimens only), is one component of a comprehensive MRSA colonization surveillance program. It is not intended to diagnose MRSA infection nor to guide or monitor treatment for MRSA infections. Test performance is not FDA approved in patients less than 63 years old. Performed at [facility], [address], [place], [place] [address]      Radiology Studies: No results found.  Scheduled Meds:  amLODipine  10 mg Per Tube Daily   Chlorhexidine Gluconate Cloth  6 each Topical Daily   feeding supplement (OSMOLITE 1.2 CAL)  355 mL Per Tube QID   feeding supplement (PROSource TF)  45 mL Per Tube BID   free water  200 mL Per Tube Q4H   guaiFENesin  10 mL Per Tube Q8H   levETIRAcetam  750 mg Per Tube BID   levothyroxine  100 mcg Per Tube Q0600   polyethylene glycol  17 g Per Tube BID   QUEtiapine  50 mg Per Tube BID   senna  1 tablet Per Tube Daily    Continuous Infusions:   LOS: 10 days     Bonnielee Haff, MD [facility]   If 7PM-7AM, please contact night-coverage

## 2020-12-25 DIAGNOSIS — J9601 Acute respiratory failure with hypoxia: Secondary | ICD-10-CM | POA: Diagnosis not present

## 2020-12-25 LAB — GLUCOSE, CAPILLARY
Glucose-Capillary: 112 mg/dL — ABNORMAL HIGH (ref 70–99)
Glucose-Capillary: 137 mg/dL — ABNORMAL HIGH (ref 70–99)
Glucose-Capillary: 190 mg/dL — ABNORMAL HIGH (ref 70–99)
Glucose-Capillary: 69 mg/dL — ABNORMAL LOW (ref 70–99)
Glucose-Capillary: 97 mg/dL (ref 70–99)

## 2020-12-25 NOTE — Progress Notes (Signed)
Nutrition Follow-up  DOCUMENTATION CODES:  Severe malnutrition in context of chronic illness  INTERVENTION:  Continue 355 ml (1.5 cartons) of Osmolite 1.2 QID via PEG - 45 ml Prosource TF BID via PEG - 200 ml free water every 4 hours via PEG   Complete regimen provides 1784 kcals, 100 grams protein, and 1964 ml free water daily, meeting 100% of estimated kcal and protein needs.  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  Patient was found in respiratory distress in her room with a tracheostomy tube on the floor.  This is her fifth visit to the [facility] ED due to tracheostomy complications in 1 month. 9/18 - Taken emergently to OR for mechanical intubation by ENT after tracheostomy malfunction. Admitted to ICU requiring mechanical ventilatory support  Is medically stable and ready to discharge. However no bed has been available in any facility at this time.  Pt continues to be NPO. Receiving all TF boluses. No complaints of intolerance at this time.  Admit wt: 55.1 kg Current wt: 58 kg  Continue current nutrition plan.  Medications: reviewed; FWF 200 ml q4 hrs, Keppra BID, Synthroid, miralax,  Senokot  Labs: reviewed; CBG 69-190  Diet Order:   Diet Order             Diet NPO time specified  Diet effective now                  EDUCATION NEEDS:  Education needs have been addressed  Skin:  Skin Assessment: Reviewed RN Assessment  Last BM:  12/24/20  Height:  Ht Readings from Last 1 Encounters:  12/14/20 _0  (1.676 m)   Weight:  Wt Readings from Last 1 Encounters:  12/22/20 58 kg   BMI:  Body mass index is 20.64 kg/m.  Estimated Nutritional Needs:  Kcal:  2111-5520 Protein:  85-100 grams Fluid:  >1.75 L  Derrel Nip, RD, LDN (she/her/hers) Registered Dietitian I After-Hours/Weekend Pager # in [place]

## 2020-12-25 NOTE — Progress Notes (Signed)
PROGRESS NOTE    Stacy Moran  XBD:[id number] DOB: 12-01-1955 DOA: 12/14/2020 PCP: [name], MD    Brief Narrative:  Stacy Moran is a 65 year old F with history bilateral vocal cord paralysis, chronic tracheostomy and PEG, HTN, history of subdural hematoma, subarachnoid hematoma, epilepsy, hypothyroidism, and VP shunt for recurrent hydrocephalus who presented from her SNF due to respiratory distress.  Evidently patient's tracheostomy tube become dislodged accidentally, and she was in severe respiratory distress.    On arrival, she was emergently taken to the OR, intubated and her tracheostomy tube was replaced by ENT, Dr. Pryor Ochoa.  She is subsequently stabilized.  Has been ready for discharge to skilled nursing facility.  However no bed has been available in any facility at this time.  9/29 no complaints.  This a.m. hypoglycemic with blood glucose 69 was given OJ.    Assessment & Plan:   Acute respiratory failure with hypoxia due to displaced tracheostomy in the setting of chronic bilateral vocal cord dysfunction ENT consulted, patient underwent ET tube replacement in the OR.  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 She has had teaching from RT on her new trach.   May have as needed albuterol Follow up with ENT Dr. Pryor Ochoa in 1 month 9/29 patient remains stable from respiratory standpoint    Dysphagia due to bilateral vocal cord dysfunction Continue tube feedings.  Getting bolus feeds.     Hypoglycemia/type 2 diabetes mellitus without complications Noted to have low glucose levels on 9/26.  Her sliding scale coverage was discontinued.  She is likely very sensitive to insulin.  We will tolerate higher levels of glucose in this individual.  Continue to monitor CBGs, need not be more than 3-4 times a day.  HbA1c 6.2. 9/29 still having hypoglycemic episodes. Improved with OJ. Will monitor closely   Thrombocytopenia Platelet counts  noted to be 90,000 on 9/26.  It was 129,000 on 9/21 and was 171,000 on 9/9.   She was noted to be on Lovenox which was held.   Labs were repeated on 9/27 and platelet count was noted to be better at 123,000.   We will continue just SCDs for now and leave her off of Lovenox.   9/29 will ck cbc periodically      Seizures No seizures since admission Continue keppra   History of subarachnoid hemorrhage History of subdural hematoma Recurrent hydrocephalus History of VP shunt   Mood disorder Continue Seroquel  Essential hypertension Blood pressure is reasonably well controlled.  Continue amlodipine.  Postoperative hypothyroidism Continue levothyroxine   DVT prophylaxis: SCDs only for now Code Status: Full code Family Communication: No family at bedside Disposition Plan: SNF when bed is available   Status is: Inpatient  Remains inpatient appropriate because:Unsafe d/c plan  Dispo: The patient is from: SNF              Anticipated d/c is to: SNF              Patient currently is medically stable to d/c.   Difficult to place patient No     Consultants:  PCCM ENT  Procedures: Replacement of tracheostomy tube.  Antimicrobials:   Anti-infectives (From admission, onward)    None       Subjective: Has no complaints this am. No sob, abd pain , dizziness   Objective: Vitals:   12/25/20 0434 12/25/20 0742 12/25/20 1147 12/25/20 1219  BP:  95/62  (!) 171/98  Pulse:  60  93  Resp:  18  18  Temp:  97.8 F (36.6 C)  97.7 F (36.5 C)  TempSrc:      SpO2: 99% 100%  100%  Weight:   57.9 kg   Height:        Intake/Output Summary (Last 24 hours) at 12/25/2020 1301 Last data filed at 12/25/2020 1139 Gross per 24 hour  Intake 432 ml  Output --  Net 432 ml    Filed Weights   12/21/20 0500 12/22/20 0454 12/25/20 1147  Weight: 58.4 kg 58 kg 57.9 kg    Examination: Nad, calm Trach in place Decrease bs, no w/r Regular s1/s2 no gallop Soft benign +bs No  edema Grossly intact    Data Reviewed: I have personally reviewed following labs and imaging studies  CBC: Recent Labs  Lab 12/22/20 0422 12/23/20 0323  WBC 3.8* 3.7*  HGB 11.1* 11.0*  HCT 35.5* 35.0*  MCV 82.6 82.0  PLT 90* 147*   Basic Metabolic Panel: Recent Labs  Lab 12/22/20 0422  NA 142  K 4.1  CL 106  CO2 28  GLUCOSE 60*  BUN 25*  CREATININE 0.59  CALCIUM 9.4   GFR: Estimated Creatinine Clearance: 65.8 mL/min (by C-G formula based on SCr of 0.59 mg/dL).   CBG: Recent Labs  Lab 12/24/20 0351 12/25/20 0003 12/25/20 0754 12/25/20 0842 12/25/20 1215  GLUCAP 82 190* 69* 97 112*    No results found for this or any previous visit (from the past 240 hour(s)).    Radiology Studies: No results found.  Scheduled Meds:  amLODipine  10 mg Per Tube Daily   Chlorhexidine Gluconate Cloth  6 each Topical Daily   feeding supplement (OSMOLITE 1.2 CAL)  355 mL Per Tube QID   feeding supplement (PROSource TF)  45 mL Per Tube BID   free water  200 mL Per Tube Q4H   guaiFENesin  10 mL Per Tube Q8H   levETIRAcetam  750 mg Per Tube BID   levothyroxine  100 mcg Per Tube Q0600   polyethylene glycol  17 g Per Tube BID   QUEtiapine  50 mg Per Tube BID   senna  1 tablet Per Tube Daily   Continuous Infusions:   LOS: 11 days   Time spent 35 minutes with more than 50% on COC  Nolberto Hanlon, MD [facility]

## 2020-12-25 NOTE — Progress Notes (Signed)
ypoglycemic Event  CBG: 69  Treatment: 4 oz orange juice  Symptoms: none  Follow-up CBG: [id number] CBG Result:97  Possible Reasons for Event: pt's POCT/FSBS order is for q6hr; the 0600 POCT was not in the results section of [facility]; NT obtained POCT/FSBS at 0754; pt A&O  Comments/MD notified:  [facility] POCT testing indicates call MD < 90 or > 160; Dr. Lupita Dawn not available for secure chat at [phone]; left voicemail message on [phone] for her to call me about a pt in Room 105; to date no response, at 0812 gave 4 oz orange juice and 30 ml water flush via G tube per hypoglycemia protocol    Stacy Moran Silvestre Moment

## 2020-12-26 DIAGNOSIS — J9601 Acute respiratory failure with hypoxia: Secondary | ICD-10-CM | POA: Diagnosis not present

## 2020-12-26 LAB — GLUCOSE, CAPILLARY
Glucose-Capillary: 95 mg/dL (ref 70–99)
Glucose-Capillary: 85 mg/dL (ref 70–99)
Glucose-Capillary: 92 mg/dL (ref 70–99)
Glucose-Capillary: 119 mg/dL — ABNORMAL HIGH (ref 70–99)

## 2020-12-26 NOTE — TOC Progression Note (Signed)
Transition of Care [facility]) - Progression Note    Patient Details  Name: Geneive Sandstrom MRN: [id number] Date of Birth: 1955-09-28  [facility] CM/SW Contact  [name], RN Phone Number: 12/26/2020, 10:56 AM  Clinical Narrative:    RNCM reached out to [facility] to see if they can review patient and maybe accept.  The do accept trach patient's and Administrator will review.     Expected Discharge Plan: [place] Barriers to Discharge: No SNF bed  Expected Discharge Plan and Services Expected Discharge Plan: [place]   Discharge Planning Services: CM Consult Post Acute Care Choice: [place] Living arrangements for the past 2 months: [place]                 DME Arranged: N/A DME Agency: NA       HH Arranged: NA HH Agency: NA         Social Determinants of Health (SDOH) Interventions    Readmission Risk Interventions No flowsheet data found.

## 2020-12-26 NOTE — Progress Notes (Signed)
PROGRESS NOTE    Stacy Moran  POE:[id number] DOB: Sep 03, 1955 DOA: 12/14/2020 PCP: [name], MD    Brief Narrative:  [name] is a 65 year old F with history bilateral vocal cord paralysis, chronic tracheostomy and PEG, HTN, history of subdural hematoma, subarachnoid hematoma, epilepsy, hypothyroidism, and VP shunt for recurrent hydrocephalus who presented from her SNF due to respiratory distress.  Evidently patient's tracheostomy tube become dislodged accidentally, and she was in severe respiratory distress.    On arrival, she was emergently taken to the OR, intubated and her tracheostomy tube was replaced by ENT, Dr. Pryor Ochoa.  She is subsequently stabilized.  Has been ready for discharge to skilled nursing facility.  However no bed has been available in any facility at this time.  9/29 no complaints.  This a.m. hypoglycemic with blood glucose 69 was given OJ.  9/30 sleepiness AM.  Has no complaints.  Blood glucose 85 this AM  Assessment & Plan:   Acute respiratory failure with hypoxia due to displaced tracheostomy in the setting of chronic bilateral vocal cord dysfunction ENT consulted, patient underwent ET tube replacement in the OR.  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 She has had teaching from RT on her new trach.   May have as needed albuterol Follow up with ENT Dr. Pryor Ochoa in 1 month 9/30 patient remained stable from respiratory standpoint     Dysphagia due to bilateral vocal cord dysfunction Continue tube feedings.   Getting bolus feedings     Hypoglycemia/type 2 diabetes mellitus without complications Noted to have low glucose levels on 9/26.  Her sliding scale coverage was discontinued.  She is likely very sensitive to insulin.  We will tolerate higher levels of glucose in this individual.  Continue to monitor CBGs, need not be more than 3-4 times a day.  HbA1c 6.2. 9/29 still having hypoglycemic episodes.  Improved with OJ. Will monitor closely 9/30 BG little bit more stable.  We will continue to monitor   Thrombocytopenia Platelet counts noted to be 90,000 on 9/26.  It was 129,000 on 9/21 and was 171,000 on 9/9.   She was noted to be on Lovenox which was held.   Labs were repeated on 9/27 and platelet count was noted to be better at 123,000.   We will continue just SCDs for now and leave her off of Lovenox.   9/30 check CBC periodically.      Seizures No seizures since admission  Continue Keppra     History of subarachnoid hemorrhage History of subdural hematoma Recurrent hydrocephalus History of VP shunt   Mood disorder Continue Seroquel  Essential hypertension Blood pressure is reasonably well controlled.  Continue amlodipine.  Postoperative hypothyroidism Continue levothyroxine   DVT prophylaxis: SCDs Code Status: Full code Family Communication: No family at bedside Disposition Plan: SNF when bed is available   Status is: Inpatient  Remains inpatient appropriate because:Unsafe d/c plan  Dispo: The patient is from: SNF              Anticipated d/c is to: SNF              Patient currently is medically stable to d/c.   Difficult to place patient No     Consultants:  PCCM ENT  Procedures: Replacement of tracheostomy tube.  Antimicrobials:   Anti-infectives (From admission, onward)    None       Subjective: Denies shortness of breath, dizziness or chest pain.   Objective: Vitals:  12/25/20 2026 12/25/20 2122 12/26/20 0638 12/26/20 0744  BP:  97/71 117/73 (!) 141/83  Pulse:  65 67 79  Resp:  17 16 18   Temp:  98.3 F (36.8 C) 98.7 F (37.1 C) 98.7 F (37.1 C)  TempSrc:    Oral  SpO2: 100% 100% 100% 100%  Weight:      Height:        Intake/Output Summary (Last 24 hours) at 12/26/2020 0844 Last data filed at 12/25/2020 1829 Gross per 24 hour  Intake 896 ml  Output --  Net 896 ml    Filed Weights   12/21/20 0500 12/22/20 0454  12/25/20 1147  Weight: 58.4 kg 58 kg 57.9 kg    Examination: Calm, sleepy CTA no wheeze Regular S1-S2 no gallops Soft benign positive bowel sounds No edema    Data Reviewed: I have personally reviewed following labs and imaging studies  CBC: Recent Labs  Lab 12/22/20 0422 12/23/20 0323  WBC 3.8* 3.7*  HGB 11.1* 11.0*  HCT 35.5* 35.0*  MCV 82.6 82.0  PLT 90* 409*   Basic Metabolic Panel: Recent Labs  Lab 12/22/20 0422  NA 142  K 4.1  CL 106  CO2 28  GLUCOSE 60*  BUN 25*  CREATININE 0.59  CALCIUM 9.4   GFR: Estimated Creatinine Clearance: 65.8 mL/min (by C-G formula based on SCr of 0.59 mg/dL).   CBG: Recent Labs  Lab 12/25/20 0842 12/25/20 1215 12/25/20 1655 12/26/20 0224 12/26/20 0636  GLUCAP 97 112* 137* 95 85    No results found for this or any previous visit (from the past 240 hour(s)).    Radiology Studies: No results found.  Scheduled Meds:  amLODipine  10 mg Per Tube Daily   Chlorhexidine Gluconate Cloth  6 each Topical Daily   feeding supplement (OSMOLITE 1.2 CAL)  355 mL Per Tube QID   feeding supplement (PROSource TF)  45 mL Per Tube BID   free water  200 mL Per Tube Q4H   guaiFENesin  10 mL Per Tube Q8H   levETIRAcetam  750 mg Per Tube BID   levothyroxine  100 mcg Per Tube Q0600   polyethylene glycol  17 g Per Tube BID   QUEtiapine  50 mg Per Tube BID   senna  1 tablet Per Tube Daily   Continuous Infusions:   LOS: 12 days   Time spent 35 minutes with more than 50% on COC  Nolberto Hanlon, MD [facility]

## 2020-12-27 DIAGNOSIS — J9601 Acute respiratory failure with hypoxia: Secondary | ICD-10-CM | POA: Diagnosis not present

## 2020-12-27 DIAGNOSIS — Z789 Other specified health status: Secondary | ICD-10-CM | POA: Diagnosis not present

## 2020-12-27 LAB — GLUCOSE, CAPILLARY
Glucose-Capillary: 154 mg/dL — ABNORMAL HIGH (ref 70–99)
Glucose-Capillary: 195 mg/dL — ABNORMAL HIGH (ref 70–99)
Glucose-Capillary: 81 mg/dL (ref 70–99)
Glucose-Capillary: 87 mg/dL (ref 70–99)

## 2020-12-27 NOTE — Progress Notes (Signed)
PROGRESS NOTE    Stacy Moran  VOH:[id number] DOB: Jul 17, 1955 DOA: 12/14/2020 PCP: [name], MD    Brief Narrative:  [name] is a 65 year old F with history bilateral vocal cord paralysis, chronic tracheostomy and PEG, HTN, history of subdural hematoma, subarachnoid hematoma, epilepsy, hypothyroidism, and VP shunt for recurrent hydrocephalus who presented from her SNF due to respiratory distress.  Evidently patient's tracheostomy tube become dislodged accidentally, and she was in severe respiratory distress.    On arrival, she was emergently taken to the OR, intubated and her tracheostomy tube was replaced by ENT, Dr. Pryor Ochoa.  She is subsequently stabilized.  Has been ready for discharge to skilled nursing facility.  However no bed has been available in any facility at this time.  9/29 no complaints.  This a.m. hypoglycemic with blood glucose 69 was given OJ.  9/30 sleepiness AM.  Has no complaints.  Blood glucose 85 this AM 10/1-BG this a.m. 81 has improved patient without any complaints would like to take shower  Assessment & Plan:   Acute respiratory failure with hypoxia due to displaced tracheostomy in the setting of chronic bilateral vocal cord dysfunction ENT consulted, patient underwent ET tube replacement in the OR.  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 She has had teaching from RT on her new trach.   May have as needed albuterol Follow up with ENT Dr. Pryor Ochoa in 1 month 10/1 patient remained stable from respiratory standpoint  Continue trach care      Dysphagia due to bilateral vocal cord dysfunction Continue tube feeding boluses, RD following    Hypoglycemia/type 2 diabetes mellitus without complications Noted to have low glucose levels on 9/26.  Her sliding scale coverage was discontinued.  She is likely very sensitive to insulin.  We will tolerate higher levels of glucose in this individual.  Continue to  monitor CBGs, need not be more than 3-4 times a day.  HbA1c 6.2. 9/29 still having hypoglycemic episodes. Improved with OJ. Will monitor closely 10/1 monitor closely as she does have episodes of lower BG   Thrombocytopenia Platelet counts noted to be 90,000 on 9/26.  It was 129,000 on 9/21 and was 171,000 on 9/9.   She was noted to be on Lovenox which was held.   Labs were repeated on 9/27 and platelet count was noted to be better at 123,000.   We will continue just SCDs for now and leave her off of Lovenox.   10/1 check CBC periodically      Seizures No seizures since admission  Continue Keppra     History of subarachnoid hemorrhage History of subdural hematoma Recurrent hydrocephalus History of VP shunt   Mood disorder Continue Seroquel  Essential hypertension Blood pressure is reasonably well controlled.  Continue amlodipine.  Postoperative hypothyroidism Continue levothyroxine   DVT prophylaxis: SCDs, ambulating in room Code Status: Full code Family Communication: No family at bedside Disposition Plan: SNF when bed is available   Status is: Inpatient  Remains inpatient appropriate because:Unsafe d/c plan  Dispo: The patient is from: SNF              Anticipated d/c is to: SNF              Patient currently is medically stable to d/c.   Difficult to place patient No     Consultants:  PCCM ENT  Procedures: Replacement of tracheostomy tube.  Antimicrobials:   Anti-infectives (From admission, onward)    None  Subjective: Denies shortness of breath, dizziness or chest pain.   Objective: Vitals:   12/26/20 2049 12/27/20 0106 12/27/20 0500 12/27/20 0558  BP: 132/80 122/89  117/79  Pulse: 87 90  63  Resp: 14 18  16   Temp: 98.6 F (37 C) 99 F (37.2 C)  98 F (36.7 C)  TempSrc: Oral   Oral  SpO2: 100% 96%  97%  Weight:   58 kg   Height:        Intake/Output Summary (Last 24 hours) at 12/27/2020 0802 Last data filed at 12/26/2020  2000 Gross per 24 hour  Intake --  Output 0 ml  Net 0 ml    Filed Weights   12/22/20 0454 12/25/20 1147 12/27/20 0500  Weight: 58 kg 57.9 kg 58 kg    Examination: Calm, pleasant CTA no wheeze Regular S1-S2 no gallops Soft benign positive bowel sounds No edema Grossly intact   Data Reviewed: I have personally reviewed following labs and imaging studies  CBC: Recent Labs  Lab 12/22/20 0422 12/23/20 0323  WBC 3.8* 3.7*  HGB 11.1* 11.0*  HCT 35.5* 35.0*  MCV 82.6 82.0  PLT 90* 160*   Basic Metabolic Panel: Recent Labs  Lab 12/22/20 0422  NA 142  K 4.1  CL 106  CO2 28  GLUCOSE 60*  BUN 25*  CREATININE 0.59  CALCIUM 9.4   GFR: Estimated Creatinine Clearance: 65.9 mL/min (by C-G formula based on SCr of 0.59 mg/dL).   CBG: Recent Labs  Lab 12/26/20 0224 12/26/20 0636 12/26/20 1216 12/26/20 1833 12/27/20 0016  GLUCAP 95 85 92 119* 195*    No results found for this or any previous visit (from the past 240 hour(s)).    Radiology Studies: No results found.  Scheduled Meds:  amLODipine  10 mg Per Tube Daily   Chlorhexidine Gluconate Cloth  6 each Topical Daily   feeding supplement (OSMOLITE 1.2 CAL)  355 mL Per Tube QID   feeding supplement (PROSource TF)  45 mL Per Tube BID   free water  200 mL Per Tube Q4H   guaiFENesin  10 mL Per Tube Q8H   levETIRAcetam  750 mg Per Tube BID   levothyroxine  100 mcg Per Tube Q0600   polyethylene glycol  17 g Per Tube BID   QUEtiapine  50 mg Per Tube BID   senna  1 tablet Per Tube Daily   Continuous Infusions:   LOS: 13 days   Time spent 35 minutes with more than 50% on COC  Nolberto Hanlon, MD [facility]

## 2020-12-28 DIAGNOSIS — R569 Unspecified convulsions: Secondary | ICD-10-CM

## 2020-12-28 DIAGNOSIS — J9601 Acute respiratory failure with hypoxia: Secondary | ICD-10-CM | POA: Diagnosis not present

## 2020-12-28 LAB — GLUCOSE, CAPILLARY
Glucose-Capillary: 120 mg/dL — ABNORMAL HIGH (ref 70–99)
Glucose-Capillary: 131 mg/dL — ABNORMAL HIGH (ref 70–99)
Glucose-Capillary: 84 mg/dL (ref 70–99)
Glucose-Capillary: 86 mg/dL (ref 70–99)
Glucose-Capillary: 91 mg/dL (ref 70–99)

## 2020-12-28 NOTE — TOC Progression Note (Signed)
Transition of Care [facility]) - Progression Note    Patient Details  Name: Stacy Moran MRN: [id number] Date of Birth: 1955-05-11  Transition of [facility] CM/SW [place], LCSW Phone Number: 12/28/2020, 10:10 AM  Clinical Narrative:   TOC continues to follow for bed offers.     Expected Discharge Plan: [place] Barriers to Discharge: No SNF bed  Expected Discharge Plan and Services Expected Discharge Plan: [place]   Discharge Planning Services: CM Consult Post Acute Care Choice: [place] Living arrangements for the past 2 months: [place]                 DME Arranged: N/A DME Agency: NA       HH Arranged: NA HH Agency: NA         Social Determinants of Health (SDOH) Interventions    Readmission Risk Interventions No flowsheet data found.

## 2020-12-28 NOTE — Progress Notes (Signed)
PROGRESS NOTE    Stacy Moran  NAT:[id number] DOB: 10-16-1955 DOA: 12/14/2020 PCP: [name], MD    Brief Narrative:  [name] is a 65 year old F with history bilateral vocal cord paralysis, chronic tracheostomy and PEG, HTN, history of subdural hematoma, subarachnoid hematoma, epilepsy, hypothyroidism, and VP shunt for recurrent hydrocephalus who presented from her SNF due to respiratory distress.  Evidently patient's tracheostomy tube become dislodged accidentally, and she was in severe respiratory distress.    On arrival, she was emergently taken to the OR, intubated and her tracheostomy tube was replaced by ENT, Dr. Pryor Ochoa.  She is subsequently stabilized.  Has been ready for discharge to skilled nursing facility.  However no bed has been available in any facility at this time.  9/29 no complaints.  This a.m. hypoglycemic with blood glucose 69 was given OJ.  9/30 sleepiness AM.  Has no complaints.  Blood glucose 85 this AM 10/1-BG this a.m. 81 has improved patient without any complaints would like to take shower 10/2 no overnight issues, or complaints this am.   Assessment & Plan:   Acute respiratory failure with hypoxia due to displaced tracheostomy in the setting of chronic bilateral vocal cord dysfunction ENT consulted, patient underwent ET tube replacement in the OR.  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 She has had teaching from RT on her new trach.   May have as needed albuterol Follow up with ENT Dr. Pryor Ochoa in 1 month 10/2 Pt remains stable from respiratory stand point. Continue trach care     Dysphagia due to bilateral vocal cord dysfunction Continue TF boluses, RD following     Hypoglycemia/type 2 diabetes mellitus without complications Noted to have low glucose levels on 9/26.  Her sliding scale coverage was discontinued.  She is likely very sensitive to insulin.  We will tolerate higher levels of glucose  in this individual.  Continue to monitor CBGs, need not be more than 3-4 times a day.  HbA1c 6.2. 9/29 still having hypoglycemic episodes. Improved with OJ. Will monitor closely 10/1 monitor closely as she does have episodes of lower BG   Thrombocytopenia Platelet counts noted to be 90,000 on 9/26.  It was 129,000 on 9/21 and was 171,000 on 9/9.   She was noted to be on Lovenox which was held.   Labs were repeated on 9/27 and platelet count was noted to be better at 123,000.   We will continue just SCDs for now and leave her off of Lovenox.   10/2 ck cbc periodically      Seizures No seizures since admission  10/2 continue keppra    History of subarachnoid hemorrhage History of subdural hematoma Recurrent hydrocephalus History of VP shunt   Mood disorder Continue Seroquel  Essential hypertension Blood pressure is reasonably well controlled.  Continue amlodipine.  Postoperative hypothyroidism Continue levothyroxine   DVT prophylaxis: SCDs, ambulating in room Code Status: Full code Family Communication: No family at bedside Disposition Plan: SNF when bed is available   Status is: Inpatient  Remains inpatient appropriate because:Unsafe d/c plan  Dispo: The patient is from: SNF              Anticipated d/c is to: SNF              Patient currently is medically stable to d/c.   Difficult to place patient No     Consultants:  PCCM ENT  Procedures: Replacement of tracheostomy tube.  Antimicrobials:  Anti-infectives (From admission, onward)    None       Subjective: Has no sob, cp, abd pain   Objective: Vitals:   12/28/20 0500 12/28/20 0528 12/28/20 0745 12/28/20 0845  BP:  120/82 119/70   Pulse:  63 65 71  Resp:  16 18 18   Temp:  98.4 F (36.9 C) 97.9 F (36.6 C)   TempSrc:  Oral Oral   SpO2:  100% 98% 97%  Weight: 57.2 kg     Height:        Intake/Output Summary (Last 24 hours) at 12/28/2020 0910 Last data filed at 12/27/2020 2000 Gross  per 24 hour  Intake --  Output 0 ml  Net 0 ml    Filed Weights   12/25/20 1147 12/27/20 0500 12/28/20 0500  Weight: 57.9 kg 58 kg 57.2 kg    Examination: Calm, nad Cta no w/r Regular s1/s2 no gallop Soft benign =bs No edema Grossly intact  Data Reviewed: I have personally reviewed following labs and imaging studies  CBC: Recent Labs  Lab 12/22/20 0422 12/23/20 0323  WBC 3.8* 3.7*  HGB 11.1* 11.0*  HCT 35.5* 35.0*  MCV 82.6 82.0  PLT 90* 517*   Basic Metabolic Panel: Recent Labs  Lab 12/22/20 0422  NA 142  K 4.1  CL 106  CO2 28  GLUCOSE 60*  BUN 25*  CREATININE 0.59  CALCIUM 9.4   GFR: Estimated Creatinine Clearance: 65 mL/min (by C-G formula based on SCr of 0.59 mg/dL).   CBG: Recent Labs  Lab 12/27/20 1138 12/27/20 1704 12/28/20 0029 12/28/20 0526 12/28/20 0741  GLUCAP 154* 87 120* 86 91    No results found for this or any previous visit (from the past 240 hour(s)).    Radiology Studies: No results found.  Scheduled Meds:  amLODipine  10 mg Per Tube Daily   Chlorhexidine Gluconate Cloth  6 each Topical Daily   feeding supplement (OSMOLITE 1.2 CAL)  355 mL Per Tube QID   feeding supplement (PROSource TF)  45 mL Per Tube BID   free water  200 mL Per Tube Q4H   guaiFENesin  10 mL Per Tube Q8H   levETIRAcetam  750 mg Per Tube BID   levothyroxine  100 mcg Per Tube Q0600   polyethylene glycol  17 g Per Tube BID   QUEtiapine  50 mg Per Tube BID   senna  1 tablet Per Tube Daily   Continuous Infusions:   LOS: 14 days   Time spent 22 minutes with more than 50% on COC  Nolberto Hanlon, MD [facility]

## 2020-12-29 DIAGNOSIS — I10 Essential (primary) hypertension: Secondary | ICD-10-CM

## 2020-12-29 DIAGNOSIS — J9601 Acute respiratory failure with hypoxia: Secondary | ICD-10-CM | POA: Diagnosis not present

## 2020-12-29 DIAGNOSIS — D696 Thrombocytopenia, unspecified: Secondary | ICD-10-CM | POA: Diagnosis not present

## 2020-12-29 DIAGNOSIS — R739 Hyperglycemia, unspecified: Secondary | ICD-10-CM | POA: Diagnosis not present

## 2020-12-29 DIAGNOSIS — G40909 Epilepsy, unspecified, not intractable, without status epilepticus: Secondary | ICD-10-CM

## 2020-12-29 LAB — GLUCOSE, CAPILLARY
Glucose-Capillary: 111 mg/dL — ABNORMAL HIGH (ref 70–99)
Glucose-Capillary: 146 mg/dL — ABNORMAL HIGH (ref 70–99)
Glucose-Capillary: 152 mg/dL — ABNORMAL HIGH (ref 70–99)
Glucose-Capillary: 78 mg/dL (ref 70–99)
Glucose-Capillary: 95 mg/dL (ref 70–99)

## 2020-12-29 MED ORDER — AMLODIPINE BESYLATE 5 MG PO TABS
2.5000 mg | ORAL_TABLET | Freq: Every day | ORAL | Status: DC
  Administered 2020-12-30 – 2020-12-31 (×2): 2.5 mg
  Filled 2020-12-29 (×2): qty 1

## 2020-12-29 NOTE — Progress Notes (Signed)
PROGRESS NOTE    Stacy Moran  WLS:[id number] DOB: 01/28/1956 DOA: 12/14/2020 PCP: [name], MD    Brief Narrative:  [name] is a 65 year old F with history bilateral vocal cord paralysis, chronic tracheostomy and PEG, HTN, history of subdural hematoma, subarachnoid hematoma, epilepsy, hypothyroidism, and VP shunt for recurrent hydrocephalus who presented from her SNF due to respiratory distress.  Evidently patient's tracheostomy tube become dislodged accidentally, and she was in severe respiratory distress.    On arrival, she was emergently taken to the OR, intubated and her tracheostomy tube was replaced by ENT, Dr. Pryor Ochoa.  She is subsequently stabilized.  Has been ready for discharge to skilled nursing facility.  However no bed has been available in any facility at this time.  9/29 no complaints.  This a.m. hypoglycemic with blood glucose 69 was given OJ.  9/30 sleepiness AM.  Has no complaints.  Blood glucose 85 this AM 10/1-BG this a.m. 81 has improved patient without any complaints would like to take shower 10/3 this a.m. blood glucose 78 now increased to 95 systolic BP 811 amlodipine was instructed to be held.  She is asymptomatic denies any dizziness, shortness of breath, or chest pain  Assessment & Plan:   Acute respiratory failure with hypoxia due to displaced tracheostomy in the setting of chronic bilateral vocal cord dysfunction ENT consulted, patient underwent ET tube replacement in the OR.  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 She has had teaching from RT on her new trach.   May have as needed albuterol Follow up with ENT Dr. Pryor Ochoa in 1 month 10/3 patient remained stable from respiratory standpoint  Continue trach care      Dysphagia due to bilateral vocal cord dysfunction Continue tube feeding boluses, already following Continue free water     Hypoglycemia/type 2 diabetes mellitus without  complications Noted to have low glucose levels on 9/26.  Her sliding scale coverage was discontinued.  She is likely very sensitive to insulin.  We will tolerate higher levels of glucose in this individual.  Continue to monitor CBGs, need not be more than 3-4 times a day.  HbA1c 6.2. 9/29 still having hypoglycemic episodes. Improved with OJ. Will monitor closely 10/3 monitor closely as she does have episodes of low BG    Essential hypertension BP normotensive to low.  Asymptomatic.  Will decrease amlodipine to 2.5 mg from 10 mg.  Continue to monitor   Thrombocytopenia Platelet counts noted to be 90,000 on 9/26.  It was 129,000 on 9/21 and was 171,000 on 9/9.   She was noted to be on Lovenox which was held.   Labs were repeated on 9/27 and platelet count was noted to be better at 123,000.   We will continue just SCDs for now and leave her off of Lovenox.   10/3 check CBC periodically      Seizures No seizures since admission  Continue Keppra    History of subarachnoid hemorrhage History of subdural hematoma Recurrent hydrocephalus History of VP shunt   Mood disorder Continue Seroquel    Postoperative hypothyroidism Continue levothyroxine   DVT prophylaxis: SCDs, ambulating in room Code Status: Full code Family Communication: No family at bedside Disposition Plan: SNF when bed is available   Status is: Inpatient  Remains inpatient appropriate because:Unsafe d/c plan  Dispo: The patient is from: SNF              Anticipated d/c is to: SNF  Patient currently is medically stable to d/c.   Difficult to place patient No     Consultants:  PCCM ENT  Procedures: Replacement of tracheostomy tube.  Antimicrobials:   Anti-infectives (From admission, onward)    None       Subjective: No shortness of breath, dizziness, abdominal pain, nausea vomiting   Objective: Vitals:   12/29/20 0500 12/29/20 0502 12/29/20 0833 12/29/20 0855  BP:  128/86  109/70   Pulse:  88    Resp:  14    Temp:  98.8 F (37.1 C) 98.7 F (37.1 C)   TempSrc:  Oral Tympanic   SpO2:  100% 100% 100%  Weight: 58.4 kg     Height:        Intake/Output Summary (Last 24 hours) at 12/29/2020 1018 Last data filed at 12/28/2020 2000 Gross per 24 hour  Intake 337 ml  Output 0 ml  Net 337 ml    Filed Weights   12/27/20 0500 12/28/20 0500 12/29/20 0500  Weight: 58 kg 57.2 kg 58.4 kg    Examination: Calm, laying in bed, NAD CTA no wheeze Regular S1-S2 no gallops Soft benign positive bowel sounds, tube feeding in place No edema Grossly intact  Data Reviewed: I have personally reviewed following labs and imaging studies  CBC: Recent Labs  Lab 12/23/20 0323  WBC 3.7*  HGB 11.0*  HCT 35.0*  MCV 82.0  PLT 858*   Basic Metabolic Panel: No results for input(s): NA, K, CL, CO2, GLUCOSE, BUN, CREATININE, CALCIUM, MG, PHOS in the last 168 hours.  GFR: Estimated Creatinine Clearance: 66.4 mL/min (by C-G formula based on SCr of 0.59 mg/dL).   CBG: Recent Labs  Lab 12/28/20 1157 12/28/20 1823 12/29/20 0028 12/29/20 0603 12/29/20 0858  GLUCAP 84 131* 146* 78 95    No results found for this or any previous visit (from the past 240 hour(s)).    Radiology Studies: No results found.  Scheduled Meds:  amLODipine  10 mg Per Tube Daily   Chlorhexidine Gluconate Cloth  6 each Topical Daily   feeding supplement (OSMOLITE 1.2 CAL)  355 mL Per Tube QID   feeding supplement (PROSource TF)  45 mL Per Tube BID   free water  200 mL Per Tube Q4H   guaiFENesin  10 mL Per Tube Q8H   levETIRAcetam  750 mg Per Tube BID   levothyroxine  100 mcg Per Tube Q0600   polyethylene glycol  17 g Per Tube BID   QUEtiapine  50 mg Per Tube BID   senna  1 tablet Per Tube Daily   Continuous Infusions:   LOS: 15 days   Time spent 35 minutes minutes with more than 50% on COC  Nolberto Hanlon, MD [facility]

## 2020-12-29 NOTE — Progress Notes (Signed)
Palliative:  Disposition issue. No needs from palliative care - will sign off.   Vinie Sill, NP Palliative Medicine Team Pager [phone] (Please see [email or web address] for schedule) Team Phone [phone]

## 2020-12-29 NOTE — Plan of Care (Signed)
  Problem: Clinical Measurements: Goal: Respiratory complications will improve Outcome: Progressing   Problem: Coping: Goal: Level of anxiety will decrease Outcome: Progressing   Problem: Safety: Goal: Ability to remain free from injury will improve Outcome: Progressing   

## 2020-12-30 DIAGNOSIS — E039 Hypothyroidism, unspecified: Secondary | ICD-10-CM

## 2020-12-30 DIAGNOSIS — J9601 Acute respiratory failure with hypoxia: Secondary | ICD-10-CM | POA: Diagnosis not present

## 2020-12-30 DIAGNOSIS — G40909 Epilepsy, unspecified, not intractable, without status epilepticus: Secondary | ICD-10-CM | POA: Diagnosis not present

## 2020-12-30 DIAGNOSIS — D696 Thrombocytopenia, unspecified: Secondary | ICD-10-CM | POA: Diagnosis not present

## 2020-12-30 LAB — GLUCOSE, CAPILLARY
Glucose-Capillary: 106 mg/dL — ABNORMAL HIGH (ref 70–99)
Glucose-Capillary: 91 mg/dL (ref 70–99)
Glucose-Capillary: 119 mg/dL — ABNORMAL HIGH (ref 70–99)

## 2020-12-30 NOTE — Progress Notes (Addendum)
PROGRESS NOTE    Stacy Moran  [id number] DOB: [birth year] DOA: 12/14/2020 PCP: [name], MD    Brief Narrative:  [name] is a 65 year old F with history bilateral vocal cord paralysis, chronic tracheostomy and PEG, HTN, history of subdural hematoma, subarachnoid hematoma, epilepsy, hypothyroidism, and VP shunt for recurrent hydrocephalus who presented from her SNF due to respiratory distress.  Evidently patient's tracheostomy tube become dislodged accidentally, and she was in severe respiratory distress.    On arrival, she was emergently taken to the OR, intubated and her tracheostomy tube was replaced by ENT, Dr. Pryor Ochoa.  She is subsequently stabilized.  Has been ready for discharge to skilled nursing facility.  However no bed has been available in any facility at this time.  Dr. Kurtis Bushman week: Couple of episodes of hypoglycemia. Otherwise no issues. Tolerating TF.  Awaiting placement.    Assessment & Plan:   Acute respiratory failure with hypoxia due to displaced tracheostomy in the setting of chronic bilateral vocal cord dysfunction ENT consulted, patient underwent ET tube replacement in the OR.  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 She has had teaching from RT on her new trach.   May have as needed albuterol 10/4 patient remains stable from respiratory standpoint  Continue trach care  Follow-up with ENT Dr. Pryor Ochoa in 1 month      Dysphagia due to bilateral vocal cord dysfunction Continue tube feeding boluses according to RD  Continue free water     Hypoglycemia/type 2 diabetes mellitus without complications Noted to have low glucose levels on 9/26.  Her sliding scale coverage was discontinued.  She is likely very sensitive to insulin.  We will tolerate higher levels of glucose in this individual.  Continue to monitor CBGs, need not be more than 3-4 times a day.  HbA1c 6.2. 10/4 continue to monitor closely for  low BG  On tube feeding    Essential hypertension BG stable  Continue amlodipine 2.5 mg which was decreased from 10 mg due to low BP .    Thrombocytopenia Platelet counts noted to be 90,000 on 9/26.  It was 129,000 on 9/21 and was 171,000 on 9/9.   She was noted to be on Lovenox which was held.   Labs were repeated on 9/27 and platelet count was noted to be better at 123,000.   We will continue just SCDs for now and leave her off of Lovenox.    check CBC periodically      Seizures No seizures since admission  Continue Keppra    History of subarachnoid hemorrhage History of subdural hematoma Recurrent hydrocephalus History of VP shunt   Mood disorder Continue Seroquel    Postoperative hypothyroidism Continue levothyroxine   DVT prophylaxis: SCDs, ambulating in room Code Status: Full code Family Communication: No family at bedside Disposition Plan: SNF when bed is available   Status is: Inpatient  Remains inpatient appropriate because:Unsafe d/c plan  Dispo: The patient is from: SNF              Anticipated d/c is to: SNF              Patient currently is medically stable to d/c.   Difficult to place patient No     Consultants:  PCCM ENT  Procedures: Replacement of tracheostomy tube.  Antimicrobials:   Anti-infectives (From admission, onward)    None       Subjective: No sob, no complaints this am.  Objective: Vitals:   12/30/20 0500 12/30/20 0538 12/30/20 0746 12/30/20 0747  BP:  129/84  128/62  Pulse:  72  69  Resp:  20  15  Temp:  98.1 F (36.7 C)  (!) 97.3 F (36.3 C)  TempSrc:  Oral  Oral  SpO2:  100% 100% 100%  Weight: 55.6 kg     Height:        Intake/Output Summary (Last 24 hours) at 12/30/2020 0757 Last data filed at 12/29/2020 2200 Gross per 24 hour  Intake 380 ml  Output --  Net 380 ml    Filed Weights   12/28/20 0500 12/29/20 0500 12/30/20 0500  Weight: 57.2 kg 58.4 kg 55.6 kg    Examination: Calm, nad Cta  no w/r Rrr s1/s2 nogallop Soft benign +bs No edema  Data Reviewed: I have personally reviewed following labs and imaging studies  CBC: No results for input(s): WBC, NEUTROABS, HGB, HCT, MCV, PLT in the last 168 hours.  Basic Metabolic Panel: No results for input(s): NA, K, CL, CO2, GLUCOSE, BUN, CREATININE, CALCIUM, MG, PHOS in the last 168 hours.  GFR: Estimated Creatinine Clearance: 63.2 mL/min (by C-G formula based on SCr of 0.59 mg/dL).   CBG: Recent Labs  Lab 12/29/20 0858 12/29/20 1144 12/29/20 1630 12/30/20 0138 12/30/20 0532  GLUCAP 95 152* 111* 106* 91    No results found for this or any previous visit (from the past 240 hour(s)).    Radiology Studies: No results found.  Scheduled Meds:  amLODipine  2.5 mg Per Tube Daily   Chlorhexidine Gluconate Cloth  6 each Topical Daily   feeding supplement (OSMOLITE 1.2 CAL)  355 mL Per Tube QID   feeding supplement (PROSource TF)  45 mL Per Tube BID   free water  200 mL Per Tube Q4H   guaiFENesin  10 mL Per Tube Q8H   levETIRAcetam  750 mg Per Tube BID   levothyroxine  100 mcg Per Tube Q0600   polyethylene glycol  17 g Per Tube BID   QUEtiapine  50 mg Per Tube BID   senna  1 tablet Per Tube Daily   Continuous Infusions:   LOS: 16 days   Time spent 25 minutes minutes with more than 50% on COC  Nolberto Hanlon, MD [facility]

## 2020-12-31 ENCOUNTER — Ambulatory Visit: Payer: Medicaid Other | Admitting: Physician Assistant

## 2020-12-31 DIAGNOSIS — J9601 Acute respiratory failure with hypoxia: Secondary | ICD-10-CM | POA: Diagnosis not present

## 2020-12-31 DIAGNOSIS — J9503 Malfunction of tracheostomy stoma: Secondary | ICD-10-CM | POA: Diagnosis not present

## 2020-12-31 LAB — GLUCOSE, CAPILLARY
Glucose-Capillary: 151 mg/dL — ABNORMAL HIGH (ref 70–99)
Glucose-Capillary: 80 mg/dL (ref 70–99)

## 2020-12-31 MED ORDER — OSMOLITE 1.2 CAL PO LIQD
355.0000 mL | Freq: Every day | ORAL | Status: DC
  Administered 2020-12-31 – 2021-02-18 (×239): 355 mL

## 2020-12-31 MED ORDER — FREE WATER
100.0000 mL | Status: DC
  Administered 2020-12-31 – 2021-01-03 (×17): 100 mL

## 2020-12-31 MED ORDER — POLYETHYLENE GLYCOL 3350 17 G PO PACK: 17.0000 g | PACK | Freq: Every day | ORAL | Status: DC | PRN

## 2020-12-31 MED ORDER — INFLUENZA VAC SPLIT QUAD 0.5 ML IM SUSY: 0.5000 mL | PREFILLED_SYRINGE | INTRAMUSCULAR | Status: DC

## 2020-12-31 NOTE — Progress Notes (Signed)
Stacy Moran  UYQ:[id number] DOB: 08-05-1955 DOA: 12/14/2020 PCP: [name], MD    Brief Narrative:  65 year old with history bilateral vocal cord paralysis, chronic tracheostomy and PEG, HTN, history of subdural hematoma, subarachnoid hematoma, epilepsy, hypothyroidism, and VP shunt for recurrent hydrocephalus who presented from her SNF due to respiratory distress.  Evidently patient's tracheostomy tube become dislodged accidentally, and she was in severe respiratory distress.     On arrival, she was emergently taken to the OR, intubated and her tracheostomy tube was replaced by ENT, Dr. Pryor Ochoa.  She is subsequently stabilized.  Has been ready for discharge to skilled nursing facility.  However no bed has been available in any facility at this time.  Disposition: Awaiting SNF placement   Consultants:  ENT  Code Status: FULL CODE  Antimicrobials:  None presently  DVT prophylaxis: SCDs  Subjective: Afebrile.  Vital signs stable.  Saturation 96% on 5 L trach collar.  Assessment & Plan:  Acute hypoxic respiratory failure -displaced chronic tracheostomy -chronic bilateral vocal cord dysfunction ENT emergently took the patient to the OR for replacement of his chronic tracheostomy at the time of his presentation -has been educated on care of new trach by RT -stable from a respiratory standpoint -follow-up with Dr. Pryor Ochoa from ENT in 1 month  Dysphagia due to bilateral vocal cord dysfunction Continue tube feeding per bolus protocol -has been evaluated by RD  DM2 with hypoglycemia Appears to be very sensitive to insulin - hypoglycemia appears to have resolved with discontinuation of SSI - A1c 6.2  Essential HTN Stable  Thrombocytopenia Lovenox was held when platelet count dropped below 100,000  Seizure disorder Well-controlled -continue Keppra  History of subarachnoid and subdural hematomas with recurrent hydrocephalus status post VP shunt  Mood disorder Continue  usual Seroquel dose  Postoperative hypothyroidism Continue usual Synthroid dose    Family Communication:  Status is: Inpatient  Remains inpatient appropriate because:Unsafe d/c plan  Dispo: The patient is from: SNF              Anticipated d/c is to: SNF              Patient currently is medically stable to d/c.   Difficult to place patient Yes  Objective: Blood pressure (!) 105/53, pulse 82, temperature 98.2 F (36.8 C), temperature source Oral, resp. rate 14, height 5\' 6"  (1.676 m), weight 56 kg, SpO2 96 %.  Intake/Output Summary (Last 24 hours) at 12/31/2020 1225 Last data filed at 12/31/2020 0509 Gross per 24 hour  Intake 935 ml  Output --  Net 935 ml   Filed Weights   12/29/20 0500 12/30/20 0500 12/31/20 0500  Weight: 58.4 kg 55.6 kg 56 kg    Examination: General: No acute respiratory distress Lungs: Clear to auscultation bilaterally without wheezes or crackles Cardiovascular: Regular rate and rhythm without murmur gallop or rub normal S1 and S2 Abdomen: Nontender, nondistended, soft, bowel sounds positive, no rebound, no ascites, no appreciable mass Extremities: No significant cyanosis, clubbing, or edema bilateral lower extremities  CBC: No results for input(s): WBC, NEUTROABS, HGB, HCT, MCV, PLT in the last 168 hours. Basic Metabolic Panel: No results for input(s): NA, K, CL, CO2, GLUCOSE, BUN, CREATININE, CALCIUM, MG, PHOS in the last 168 hours. GFR: Estimated Creatinine Clearance: 63.6 mL/min (by C-G formula based on SCr of 0.59 mg/dL).   HbA1C: Hgb A1c MFr Bld  Date/Time Value Ref Range Status  12/14/2020 10:20 AM 6.2 (H) 4.8 - 5.6 % Final  Comment:    (NOTE) Pre diabetes:          5.7%-6.4%  Diabetes:              >6.4%  Glycemic control for   <7.0% adults with diabetes   04/22/2020 03:10 PM 5.8 (H) 4.8 - 5.6 % Final    Comment:    (NOTE) Pre diabetes:          5.7%-6.4%  Diabetes:              >6.4%  Glycemic control for   <7.0% adults  with diabetes     CBG: Recent Labs  Lab 12/30/20 0138 12/30/20 0532 12/30/20 1955 12/31/20 0049 12/31/20 0458  GLUCAP 106* 91 119* 151* 80    Scheduled Meds:  amLODipine  2.5 mg Per Tube Daily   Chlorhexidine Gluconate Cloth  6 each Topical Daily   feeding supplement (OSMOLITE 1.2 CAL)  355 mL Per Tube QID   feeding supplement (PROSource TF)  45 mL Per Tube BID   free water  200 mL Per Tube Q4H   guaiFENesin  10 mL Per Tube Q8H   [START ON 01/01/2021] influenza vac split quadrivalent PF  0.5 mL Intramuscular Tomorrow-1000   levETIRAcetam  750 mg Per Tube BID   levothyroxine  100 mcg Per Tube Q0600   polyethylene glycol  17 g Per Tube BID   QUEtiapine  50 mg Per Tube BID   senna  1 tablet Per Tube Daily     LOS: 17 days   Cherene Altes, MD [facility] Office  [phone] Pager - Text Page per Shea Evans  If 7PM-7AM, please contact night-coverage per Amion 12/31/2020, 12:25 PM

## 2020-12-31 NOTE — TOC Progression Note (Signed)
Transition of Care [facility]) - Progression Note    Patient Details  Name: Hannan Tetzlaff MRN: [id number] Date of Birth: 1955/05/31  Transition of Care [facility]) CM/SW Contact  [name], RN Phone Number: 12/31/2020, 4:10 PM  Clinical Narrative:     Referral faxed to [facility] in [place], they report that they do not have any beds currently but will put patient on the wait list for consideration once a bed does become available.  Fax: [phone].  Message left for admissions at [facility] in [place] for return call.  Unable to get in touch with [facility] in [place].  All 3 of these facilities are listed as [facility] facilities on the [facility] web site.    Expected Discharge Plan: [place] Barriers to Discharge: No SNF bed  Expected Discharge Plan and Services Expected Discharge Plan: [place]   Discharge Planning Services: CM Consult Post Acute Care Choice: [place] Living arrangements for the past 2 months: [place]                 DME Arranged: N/A DME Agency: NA       HH Arranged: NA HH Agency: NA         Social Determinants of Health (SDOH) Interventions    Readmission Risk Interventions Readmission Risk Prevention Plan 12/31/2020  Transportation Screening Complete  Medication Review Press photographer) Complete  PCP or Specialist appointment within 3-5 days of discharge Complete  HRI or Home Care Consult Complete  SW Recovery Care/Counseling Consult Complete  Palliative Care Screening Not [place] Complete

## 2020-12-31 NOTE — Progress Notes (Addendum)
Nutrition Follow-up  DOCUMENTATION CODES:  Severe malnutrition in context of chronic illness  INTERVENTION:  Continue TF via PEG. Increase number of bolus feeds for weight loss. 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 - ongoing  GOAL:  Patient will meet greater than or equal to 90% of their needs - being addressed with TF  MONITOR:  TF tolerance, Labs, Weight trends, Vent status, I & O's  REASON FOR ASSESSMENT:  Consult Enteral/tube feeding initiation and management  ASSESSMENT:  65 yo female with PMH of vocal cord paralysis with chronic tracheostomy, HTN, hx subarachnoid hemorrhage, chronic PEG tube, anxiety, and ventriculoperitoneal shunt for recurrent hydrocephalus presented to ED from [facility] with respiratory distress. Patient was found in respiratory distress in her room with a tracheostomy tube on the floor.  Pt sleeping at the time of assessment, RN administering a bolus feed. Discussed TF regimen, will add an additional bolus feed  as it appears that weight loss has occurred this admission. Labs have not been assessed in >1 week, requested a new BMP for the AM as free water flushes were previously ordered 277mL q4h.   Pt medically stable, CM working on placement.  Nutritionally Relevant Medications: Scheduled Meds:  feeding supplement (OSMOLITE 1.2 CAL)  355 mL Per Tube QID   feeding supplement (PROSource TF)  45 mL Per Tube BID   free water  200 mL Per Tube Q4H   polyethylene glycol  17 g Per Tube BID   senna  1 tablet Per Tube Daily   PRN Meds: docusate sodium, polyethylene  glycol  Labs Reviewed  NUTRITION - FOCUSED PHYSICAL EXAM: Flowsheet Row Most Recent Value  Orbital Region Mild depletion  Upper Arm Region Severe depletion  Thoracic and Lumbar Region Moderate depletion  Buccal Region Moderate depletion  Temple Region Moderate depletion  Clavicle Bone Region Moderate depletion  Clavicle and Acromion Bone Region Moderate depletion  Scapular Bone Region Mild depletion  Dorsal Hand Moderate depletion  Patellar Region Severe depletion  Anterior Thigh Region Severe depletion  Posterior Calf Region Severe depletion  Edema (RD Assessment) None  Hair Reviewed  Eyes Reviewed  Mouth Reviewed  Skin Reviewed  Nails Reviewed   Diet Order:   Diet Order             Diet NPO time specified  Diet effective now                   EDUCATION NEEDS:  Education needs have been addressed  Skin:  Skin Assessment: Reviewed RN Assessment  Last BM:  10/2  Height:  Ht Readings from Last 1 Encounters:  12/14/20 5\' 6"  (1.676 m)   Weight:  Wt Readings from Last 1 Encounters:  12/31/20 56 kg   Ideal Body Weight:  59.1 kg  BMI:  Body mass index is 19.93 kg/m.  Estimated Nutritional Needs:  Kcal:  1800-2000 kcal Protein:  90-100 g/d Fluid:  1700-1900 mL/d   Ranell Patrick, RD, LDN Clinical Dietitian Pager on [place]

## 2020-12-31 NOTE — TOC Progression Note (Signed)
Transition of Care [facility]) - Progression Note    Patient Details  Name: Indiyah Paone MRN: [id number] Date of Birth: 02-23-1956  Transition [facility]) CM/SW Contact  [name], RN Phone Number: 12/31/2020, 3:40 PM  Clinical Narrative:     RNCM talked with patient's daughter via phone and updated that [facility] is still looking for placement.  There are not many facilities that have staffing or the ability to care for trachs in the area.    Expected Discharge Plan: [place] Barriers to Discharge: No SNF bed  Expected Discharge Plan and Services Expected Discharge Plan: [place]   Discharge Planning Services: CM Consult Post Acute Care Choice: [place] Living arrangements for the past 2 months: [place]                 DME Arranged: N/A DME Agency: NA       HH Arranged: NA HH Agency: NA         Social Determinants of Health (SDOH) Interventions    Readmission Risk Interventions Readmission Risk Prevention Plan 12/31/2020  Transportation Screening Complete  Medication Review Press photographer) Complete  PCP or Specialist appointment within 3-5 days of discharge Complete  HRI or Home Care Consult Complete  SW Recovery Care/Counseling Consult Complete  Palliative Care Screening Not [place] Complete

## 2021-01-01 DIAGNOSIS — J9601 Acute respiratory failure with hypoxia: Secondary | ICD-10-CM | POA: Diagnosis not present

## 2021-01-01 DIAGNOSIS — J9503 Malfunction of tracheostomy stoma: Secondary | ICD-10-CM | POA: Diagnosis not present

## 2021-01-01 LAB — CBC
HCT: 33.7 % — ABNORMAL LOW (ref 36.0–46.0)
Hemoglobin: 10.7 g/dL — ABNORMAL LOW (ref 12.0–15.0)
MCH: 26.6 pg (ref 26.0–34.0)
MCHC: 31.8 g/dL (ref 30.0–36.0)
MCV: 83.8 fL (ref 80.0–100.0)
Platelets: 141 10*3/uL — ABNORMAL LOW (ref 150–400)
RBC: 4.02 MIL/uL (ref 3.87–5.11)
RDW: 14.2 % (ref 11.5–15.5)
WBC: 4.3 10*3/uL (ref 4.0–10.5)
nRBC: 0 % (ref 0.0–0.2)

## 2021-01-01 LAB — GLUCOSE, CAPILLARY
Glucose-Capillary: 137 mg/dL — ABNORMAL HIGH (ref 70–99)
Glucose-Capillary: 136 mg/dL — ABNORMAL HIGH (ref 70–99)
Glucose-Capillary: 86 mg/dL (ref 70–99)
Glucose-Capillary: 169 mg/dL — ABNORMAL HIGH (ref 70–99)

## 2021-01-01 LAB — BASIC METABOLIC PANEL
Anion gap: 6 (ref 5–15)
BUN: 27 mg/dL — ABNORMAL HIGH (ref 8–23)
CO2: 33 mmol/L — ABNORMAL HIGH (ref 22–32)
Calcium: 9.5 mg/dL (ref 8.9–10.3)
Chloride: 103 mmol/L (ref 98–111)
Creatinine, Ser: 0.52 mg/dL (ref 0.44–1.00)
GFR, Estimated: >60 mL/min (ref >60)
Glucose, Bld: 97 mg/dL (ref 70–99)
Potassium: 4 mmol/L (ref 3.5–5.1)
Sodium: 142 mmol/L (ref 135–145)

## 2021-01-01 NOTE — Progress Notes (Signed)
Stacy Moran  [id number]:[id number] DOB: Nov 02, 1955 DOA: 12/14/2020 PCP: [name], MD    Brief Narrative:  [phone] with history bilateral vocal cord paralysis, chronic tracheostomy and PEG, HTN, subdural hematoma, subarachnoid hematoma, epilepsy, hypothyroidism, and VP shunt for recurrent hydrocephalus who presented from her SNF due to respiratory distress.  Evidently patient's tracheostomy tube become dislodged accidentally, and she was in severe respiratory distress.     On arrival, she was emergently taken to the OR, intubated and her tracheostomy tube was replaced by ENT, Dr. Pryor Ochoa.  She has subsequently stabilized.  Has been ready for discharge to skilled nursing facility for many days now,  however no bed has been available.  Disposition: Awaiting SNF placement   Consultants:  ENT  Code Status: FULL CODE  Antimicrobials:  None presently  DVT prophylaxis: SCDs  Subjective: Afebrile.  Vital signs stable.  Resting comfortably in bed with no evidence of distress.  Assessment & Plan:  Acute hypoxic respiratory failure - displaced chronic tracheostomy - chronic bilateral vocal cord dysfunction ENT emergently took the patient to the OR for replacement of her chronic tracheostomy at the time of presentation -has been educated on care of new trach by RT -stable from a respiratory standpoint -follow-up with Dr. Pryor Ochoa from ENT in 1 month  Dysphagia due to bilateral vocal cord dysfunction Continue tube feeding per bolus protocol - has been evaluated by RD  DM2 with hypoglycemia Appears to be very sensitive to insulin - hypoglycemia appears to have resolved with discontinuation of SSI - A1c 6.2  Essential HTN BP is stable  Thrombocytopenia Lovenox was held when platelet count dropped below 100,000 - Plt now stable > 100  Seizure disorder Well-controlled -continue Keppra  History of subarachnoid and subdural hematomas with recurrent hydrocephalus status post VP  shunt  Mood disorder Continue usual Seroquel dose  Postoperative hypothyroidism Continue usual Synthroid dose    Family Communication: No family present at time of exam Status is: Inpatient  Remains inpatient appropriate because:Unsafe d/c plan  Dispo: The patient is from: SNF              Anticipated d/c is to: SNF              Patient currently is medically stable to d/c.   Difficult to place patient Yes  Objective: Blood pressure 120/72, pulse 80, temperature 98 F (36.7 C), temperature source Oral, resp. rate 18, height 5\' 6"  (1.676 m), weight 56 kg, SpO2 100 %.  Intake/Output Summary (Last 24 hours) at 01/01/2021 0905 Last data filed at 01/01/2021 0720 Gross per 24 hour  Intake 100 ml  Output 0 ml  Net 100 ml    Filed Weights   12/29/20 0500 12/30/20 0500 12/31/20 0500  Weight: 58.4 kg 55.6 kg 56 kg    Examination: General: No acute respiratory distress Lungs: Clear to auscultation bilaterally  Cardiovascular: RRR without murmur Abdomen: NT/ND, soft Extremities: No significant edema  CBC: Recent Labs  Lab 01/01/21 0328  WBC 4.3  HGB 10.7*  HCT 33.7*  MCV 83.8  PLT 299*   Basic Metabolic Panel: Recent Labs  Lab 01/01/21 0328  NA 142  K 4.0  CL 103  CO2 33*  GLUCOSE 97  BUN 27*  CREATININE 0.52  CALCIUM 9.5   GFR: Estimated Creatinine Clearance: 63.6 mL/min (by C-G formula based on SCr of 0.52 mg/dL).   HbA1C: Hgb A1c MFr Bld  Date/Time Value Ref Range Status  12/14/2020 10:20 AM 6.2 (H) 4.8 -  5.6 % Final    Comment:    (NOTE) Pre diabetes:          5.7%-6.4%  Diabetes:              >6.4%  Glycemic control for   <7.0% adults with diabetes   04/22/2020 03:10 PM 5.8 (H) 4.8 - 5.6 % Final    Comment:    (NOTE) Pre diabetes:          5.7%-6.4%  Diabetes:              >6.4%  Glycemic control for   <7.0% adults with diabetes     CBG: Recent Labs  Lab 12/30/20 0532 12/30/20 1955 12/31/20 0049 12/31/20 0458 01/01/21 0615   GLUCAP 91 119* 151* 80 137*     Scheduled Meds:  Chlorhexidine Gluconate Cloth  6 each Topical Daily   feeding supplement (OSMOLITE 1.2 CAL)  355 mL Per Tube 5 X Daily   free water  100 mL Per Tube Q4H   guaiFENesin  10 mL Per Tube Q8H   influenza vac split quadrivalent PF  0.5 mL Intramuscular Tomorrow-1000   levETIRAcetam  750 mg Per Tube BID   levothyroxine  100 mcg Per Tube Q0600   polyethylene glycol  17 g Per Tube BID   QUEtiapine  50 mg Per Tube BID   senna  1 tablet Per Tube Daily     LOS: 18 days   Cherene Altes, MD [facility] Office  [phone] Pager - Text Page per Shea Evans  If 7PM-7AM, please contact night-coverage per Amion 01/01/2021, 9:05 AM

## 2021-01-02 DIAGNOSIS — J9503 Malfunction of tracheostomy stoma: Secondary | ICD-10-CM | POA: Diagnosis not present

## 2021-01-02 DIAGNOSIS — J9601 Acute respiratory failure with hypoxia: Secondary | ICD-10-CM | POA: Diagnosis not present

## 2021-01-02 LAB — GLUCOSE, CAPILLARY
Glucose-Capillary: 140 mg/dL — ABNORMAL HIGH (ref 70–99)
Glucose-Capillary: 144 mg/dL — ABNORMAL HIGH (ref 70–99)
Glucose-Capillary: 154 mg/dL — ABNORMAL HIGH (ref 70–99)
Glucose-Capillary: 87 mg/dL (ref 70–99)
Glucose-Capillary: 99 mg/dL (ref 70–99)

## 2021-01-02 MED ORDER — ACETAMINOPHEN 500 MG PO TABS
500.0000 mg | ORAL_TABLET | Freq: Four times a day (QID) | ORAL | Status: DC | PRN
  Administered 2021-01-10: 500 mg
  Filled 2021-01-02: qty 1

## 2021-01-02 MED ORDER — DOCUSATE SODIUM 50 MG/5ML PO LIQD
100.0000 mg | Freq: Two times a day (BID) | ORAL | Status: DC | PRN
  Filled 2021-01-02: qty 10

## 2021-01-02 NOTE — Progress Notes (Signed)
Stacy Moran  NUU:[id number] DOB: [birth year] DOA: 12/14/2020 PCP: [name], MD    Brief Narrative:  [phone] with a history of ruptured intracranial aneurysm and subsequent SHD, bilateral true vocal cord paralysis, chronic tracheostomy and PEG, HTN, epilepsy, hypothyroidism, and VP shunt for recurrent hydrocephalus who presented from her SNF due to respiratory distress.  Evidently patient's tracheostomy tube become dislodged accidentally, and she was in severe respiratory distress.     On arrival, she was emergently taken to the OR, intubated, and her tracheostomy tube was replaced by ENT, Dr. Pryor Ochoa after dilation of the existing fistula.  She has subsequently stabilized.  Has been ready for discharge to skilled nursing facility for many days now,  however no bed has been available.  Disposition: Awaiting SNF placement   Consultants:  ENT  Code Status: FULL CODE  Antimicrobials:  None presently  DVT prophylaxis: SCDs  Subjective: No new events reported overnight.  Afebrile.  Vital signs stable.  Saturations 100% on trach collar.  Assessment & Plan:  Acute hypoxic respiratory failure - displaced chronic tracheostomy - chronic bilateral vocal cord dysfunction ENT emergently took the patient to the OR for replacement of her chronic tracheostomy at the time of presentation -has been educated on care of new trach by RT -stable from a respiratory standpoint -follow-up with Dr. 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   Difficult to place patient Yes  Objective: Blood pressure 120/85, pulse 71, temperature 98.9 F (37.2 C), resp. rate 20, height 5\' 6"  (1.676 m), weight 56 kg, SpO2 100 %.  Intake/Output Summary (Last 24 hours) at 01/02/2021 0805 Last data filed at 01/01/2021 2134 Gross per 24 hour  Intake 100 ml  Output --  Net 100 ml    Filed Weights   12/29/20 0500 12/30/20 0500 12/31/20 0500  Weight: 58.4 kg 55.6 kg 56 kg    Examination: General: No acute respiratory distress Lungs: Clear to auscultation bilaterally  Cardiovascular: RRR    CBC: Recent Labs  Lab 01/01/21 0328  WBC 4.3  HGB 10.7*  HCT 33.7*  MCV 83.8  PLT 141*    Basic Metabolic Panel: Recent Labs  Lab 01/01/21 0328  NA 142  K 4.0  CL 103  CO2 33*  GLUCOSE 97  BUN 27*  CREATININE 0.52  CALCIUM 9.5    GFR: Estimated Creatinine Clearance: 63.6 mL/min (by C-G formula based on SCr of 0.52 mg/dL).   HbA1C: Hgb A1c MFr Bld  Date/Time Value Ref Range Status  12/14/2020 10:20 AM 6.2 (H) 4.8 - 5.6 % Final    Comment:    (NOTE) Pre diabetes:          5.7%-6.4%  Diabetes:              >6.4%  Glycemic control for   <7.0% adults with diabetes   04/22/2020 03:10 PM 5.8 (H) 4.8 - 5.6 % Final    Comment:    (NOTE) Pre diabetes:          5.7%-6.4%  Diabetes:              >6.4%  Glycemic control for   <7.0% adults with diabetes     CBG: Recent Labs  Lab 01/01/21 0937 01/01/21 1146 01/01/21 1624 01/02/21 0008 01/02/21 0552  GLUCAP  136* 86 169* 154* 140*     Scheduled Meds:  Chlorhexidine Gluconate Cloth  6 each Topical Daily   feeding supplement (OSMOLITE 1.2 CAL)  355 mL Per Tube 5 X Daily   free water  100 mL Per Tube Q4H   guaiFENesin  10 mL Per Tube Q8H   influenza vac split quadrivalent PF  0.5 mL Intramuscular Tomorrow-1000   levETIRAcetam  750 mg Per Tube BID   levothyroxine  100 mcg Per Tube Q0600   polyethylene glycol  17 g Per Tube BID   QUEtiapine  50 mg Per Tube BID   senna  1 tablet Per Tube Daily     LOS: 19 days   Cherene Altes, MD [facility] Office  [phone] Pager - Text Page per Shea Evans  If 7PM-7AM, please contact night-coverage per Amion 01/02/2021, 8:05 AM

## 2021-01-03 DIAGNOSIS — J9503 Malfunction of tracheostomy stoma: Secondary | ICD-10-CM | POA: Diagnosis not present

## 2021-01-03 DIAGNOSIS — J9601 Acute respiratory failure with hypoxia: Secondary | ICD-10-CM | POA: Diagnosis not present

## 2021-01-03 LAB — GLUCOSE, CAPILLARY
Glucose-Capillary: 102 mg/dL — ABNORMAL HIGH (ref 70–99)
Glucose-Capillary: 122 mg/dL — ABNORMAL HIGH (ref 70–99)
Glucose-Capillary: 153 mg/dL — ABNORMAL HIGH (ref 70–99)
Glucose-Capillary: 172 mg/dL — ABNORMAL HIGH (ref 70–99)
Glucose-Capillary: 88 mg/dL (ref 70–99)
Glucose-Capillary: 93 mg/dL (ref 70–99)
Glucose-Capillary: 98 mg/dL (ref 70–99)

## 2021-01-03 MED ORDER — FREE WATER
200.0000 mL | Freq: Four times a day (QID) | Status: DC
  Administered 2021-01-03 – 2021-01-04 (×5): 200 mL

## 2021-01-03 NOTE — Progress Notes (Signed)
Stacy Moran  YOV:[id number] DOB: 1956-01-25 DOA: 12/14/2020 PCP: [name], MD    Brief Narrative:  [phone] with a history of ruptured intracranial aneurysm and subsequent SHD, bilateral true vocal cord paralysis, chronic tracheostomy and PEG, HTN, epilepsy, hypothyroidism, and VP shunt for recurrent hydrocephalus who presented from her SNF due to respiratory distress.  Evidently patient's tracheostomy tube become dislodged accidentally, and she was in severe respiratory distress.     On arrival, she was emergently taken to the OR, intubated, and her tracheostomy tube was replaced by ENT, Dr. Pryor Ochoa after dilation of the existing fistula.  She has subsequently stabilized.  Has been ready for discharge to skilled nursing facility for many days now,  however no bed has been available.  Disposition: Awaiting SNF placement   Consultants:  ENT  Code Status: FULL CODE  Antimicrobials:  None presently  DVT prophylaxis: SCDs  Subjective: No new events reported overnight.  Vital stable.  Afebrile.  Resting comfortably in bed with a big smile on her face.  Denies any complaints.  Assessment & Plan:  Acute hypoxic respiratory failure - displaced chronic tracheostomy - chronic bilateral vocal cord dysfunction ENT emergently took the patient to the OR for replacement of her chronic tracheostomy at the time of presentation -has been educated on care of new trach by RT -stable from a respiratory standpoint -follow-up with Dr. Pryor Ochoa from ENT in 1 month  Dysphagia due to bilateral vocal cord dysfunction Continue tube feeding per bolus protocol - has been evaluated by RD  DM2 with hypoglycemia Appears to be very sensitive to insulin - hypoglycemia has resolved with discontinuation of SSI - A1c 6.2 - CBGs controlled   Essential HTN BP is stable  Thrombocytopenia Lovenox was held when platelet count dropped below 100,000 - Plt now stable > 100  Seizure disorder Well-controlled  -continue Keppra  History of subarachnoid and subdural hematomas with recurrent hydrocephalus status post VP shunt  Mood disorder Continue usual Seroquel dose  Postoperative hypothyroidism Continue usual Synthroid dose    Family Communication: No family present at time of exam Status is: Inpatient  Remains inpatient appropriate because:Unsafe d/c plan  Dispo: The patient is from: SNF              Anticipated d/c is to: SNF              Patient currently is medically stable to d/c.   Difficult to place patient Yes  Objective: Blood pressure 134/63, pulse 78, temperature 99 F (37.2 C), temperature source Oral, resp. rate 16, height 5\' 6"  (1.676 m), weight 59.1 kg, SpO2 100 %.  Intake/Output Summary (Last 24 hours) at 01/03/2021 0801 Last data filed at 01/02/2021 0900 Gross per 24 hour  Intake --  Output 0 ml  Net 0 ml    Filed Weights   12/30/20 0500 12/31/20 0500 01/03/21 0500  Weight: 55.6 kg 56 kg 59.1 kg    Examination: General: No acute respiratory distress Lungs: Clear to auscultation bilaterally  Abdom: PEG tube insertion site clean and dry, soft, BS positive Cardiovascular: RRR    CBC: Recent Labs  Lab 01/01/21 0328  WBC 4.3  HGB 10.7*  HCT 33.7*  MCV 83.8  PLT 141*    Basic Metabolic Panel: Recent Labs  Lab 01/01/21 0328  NA 142  K 4.0  CL 103  CO2 33*  GLUCOSE 97  BUN 27*  CREATININE 0.52  CALCIUM 9.5    GFR: Estimated Creatinine Clearance: 67.2 mL/min (by  C-G formula based on SCr of 0.52 mg/dL).   HbA1C: Hgb A1c MFr Bld  Date/Time Value Ref Range Status  12/14/2020 10:20 AM 6.2 (H) 4.8 - 5.6 % Final    Comment:    (NOTE) Pre diabetes:          5.7%-6.4%  Diabetes:              >6.4%  Glycemic control for   <7.0% adults with diabetes   04/22/2020 03:10 PM 5.8 (H) 4.8 - 5.6 % Final    Comment:    (NOTE) Pre diabetes:          5.7%-6.4%  Diabetes:              >6.4%  Glycemic control for   <7.0% adults with  diabetes     CBG: Recent Labs  Lab 01/02/21 0808 01/02/21 1233 01/02/21 1715 01/03/21 0026 01/03/21 0600  GLUCAP 144* 87 99 122* 98     Scheduled Meds:  Chlorhexidine Gluconate Cloth  6 each Topical Daily   feeding supplement (OSMOLITE 1.2 CAL)  355 mL Per Tube 5 X Daily   free water  100 mL Per Tube Q4H   guaiFENesin  10 mL Per Tube Q8H   influenza vac split quadrivalent PF  0.5 mL Intramuscular Tomorrow-1000   levETIRAcetam  750 mg Per Tube BID   levothyroxine  100 mcg Per Tube Q0600   polyethylene glycol  17 g Per Tube BID   QUEtiapine  50 mg Per Tube BID   senna  1 tablet Per Tube Daily     LOS: 20 days   Cherene Altes, MD [facility] Office  [phone] Pager - Text Page per Shea Evans  If 7PM-7AM, please contact night-coverage per Amion 01/03/2021, 8:01 AM

## 2021-01-04 DIAGNOSIS — J9601 Acute respiratory failure with hypoxia: Secondary | ICD-10-CM | POA: Diagnosis not present

## 2021-01-04 DIAGNOSIS — J9503 Malfunction of tracheostomy stoma: Secondary | ICD-10-CM | POA: Diagnosis not present

## 2021-01-04 LAB — GLUCOSE, CAPILLARY
Glucose-Capillary: 83 mg/dL (ref 70–99)
Glucose-Capillary: 111 mg/dL — ABNORMAL HIGH (ref 70–99)
Glucose-Capillary: 82 mg/dL (ref 70–99)
Glucose-Capillary: 106 mg/dL — ABNORMAL HIGH (ref 70–99)
Glucose-Capillary: 123 mg/dL — ABNORMAL HIGH (ref 70–99)

## 2021-01-04 MED ORDER — FREE WATER
150.0000 mL | Freq: Four times a day (QID) | Status: DC
  Administered 2021-01-04 – 2021-02-18 (×176): 150 mL

## 2021-01-04 NOTE — Progress Notes (Signed)
Stacy Moran  ZOX:[id number] DOB: 16-Apr-1955 DOA: 12/14/2020 PCP: [name], MD    Brief Narrative:  [phone] with a history of ruptured intracranial aneurysm and subsequent SHD, bilateral true vocal cord paralysis, chronic tracheostomy and PEG, HTN, epilepsy, hypothyroidism, and VP shunt for recurrent hydrocephalus who presented from her SNF due to respiratory distress.  Evidently patient's tracheostomy tube become dislodged accidentally, and she was in severe respiratory distress.     On arrival, she was emergently taken to the OR, intubated, and her tracheostomy tube was replaced by ENT, Dr. Pryor Ochoa after dilation of the existing fistula.  She has subsequently stabilized.  Has been ready for discharge to skilled nursing facility for many days now,  however no bed has been available.  Disposition: Awaiting SNF placement   Consultants:  ENT  Code Status: FULL CODE  Antimicrobials:  None presently  DVT prophylaxis: SCDs  Subjective: Afebrile.  Vital signs stable.  Saturation 100% on tracheostomy collar.  No acute events reported overnight.  Resting comfortably in bed.  Assessment & Plan:  Acute hypoxic respiratory failure - displaced chronic tracheostomy - chronic bilateral vocal cord dysfunction ENT emergently took the patient to the OR for replacement of her chronic tracheostomy at the time of presentation -has been educated on care of new trach by RT -stable from a respiratory standpoint -follow-up with Dr. Pryor Ochoa from ENT in 1 month  Dysphagia due to bilateral vocal cord dysfunction Continue tube feeding per bolus protocol - has been evaluated by RD  DM2 with hypoglycemia Appears to be very sensitive to insulin - hypoglycemia has resolved with discontinuation of SSI - A1c 6.2 - CBGs controlled   Essential HTN BP is stable  Thrombocytopenia Lovenox was held when platelet count dropped below 100,000 - Plt now stable > 100  Seizure disorder Well-controlled  -continue Keppra  History of subarachnoid and subdural hematomas with recurrent hydrocephalus status post VP shunt  Mood disorder Continue usual Seroquel dose  Postoperative hypothyroidism Continue usual Synthroid dose    Family Communication: No family present at time of exam Status is: Inpatient  Remains inpatient appropriate because:Unsafe d/c plan  Dispo: The patient is from: SNF              Anticipated d/c is to: SNF              Patient currently is medically stable to d/c.   Difficult to place patient Yes  Objective: Blood pressure (!) 145/92, pulse 89, temperature 99.3 F (37.4 C), temperature source Oral, resp. rate 18, height 5\' 6"  (1.676 m), weight 58.5 kg, SpO2 100 %. No intake or output data in the 24 hours ending 01/04/21 0851  Filed Weights   12/31/20 0500 01/03/21 0500 01/04/21 0500  Weight: 56 kg 59.1 kg 58.5 kg    Examination: General: No acute respiratory distress  CBC: Recent Labs  Lab 01/01/21 0328  WBC 4.3  HGB 10.7*  HCT 33.7*  MCV 83.8  PLT 141*    Basic Metabolic Panel: Recent Labs  Lab 01/01/21 0328  NA 142  K 4.0  CL 103  CO2 33*  GLUCOSE 97  BUN 27*  CREATININE 0.52  CALCIUM 9.5    GFR: Estimated Creatinine Clearance: 66.5 mL/min (by C-G formula based on SCr of 0.52 mg/dL).   HbA1C: Hgb A1c MFr Bld  Date/Time Value Ref Range Status  12/14/2020 10:20 AM 6.2 (H) 4.8 - 5.6 % Final    Comment:    (NOTE) Pre diabetes:  5.7%-6.4%  Diabetes:              >6.4%  Glycemic control for   <7.0% adults with diabetes   04/22/2020 03:10 PM 5.8 (H) 4.8 - 5.6 % Final    Comment:    (NOTE) Pre diabetes:          5.7%-6.4%  Diabetes:              >6.4%  Glycemic control for   <7.0% adults with diabetes     CBG: Recent Labs  Lab 01/03/21 1552 01/03/21 1955 01/03/21 2333 01/04/21 0508 01/04/21 0810  GLUCAP 88 172* 153* 83 111*     Scheduled Meds:  Chlorhexidine Gluconate Cloth  6 each Topical Daily    feeding supplement (OSMOLITE 1.2 CAL)  355 mL Per Tube 5 X Daily   free water  200 mL Per Tube Q6H   guaiFENesin  10 mL Per Tube Q8H   levETIRAcetam  750 mg Per Tube BID   levothyroxine  100 mcg Per Tube Q0600   polyethylene glycol  17 g Per Tube BID   QUEtiapine  50 mg Per Tube BID   senna  1 tablet Per Tube Daily     LOS: 21 days   Cherene Altes, MD [facility] Office  [phone] Pager - Text Page per Shea Evans  If 7PM-7AM, please contact night-coverage per Amion 01/04/2021, 8:51 AM

## 2021-01-05 DIAGNOSIS — J9601 Acute respiratory failure with hypoxia: Secondary | ICD-10-CM | POA: Diagnosis not present

## 2021-01-05 DIAGNOSIS — J9503 Malfunction of tracheostomy stoma: Secondary | ICD-10-CM | POA: Diagnosis not present

## 2021-01-05 LAB — GLUCOSE, CAPILLARY
Glucose-Capillary: 91 mg/dL (ref 70–99)
Glucose-Capillary: 166 mg/dL — ABNORMAL HIGH (ref 70–99)
Glucose-Capillary: 113 mg/dL — ABNORMAL HIGH (ref 70–99)

## 2021-01-05 LAB — CBC
HCT: 31.4 % — ABNORMAL LOW (ref 36.0–46.0)
Hemoglobin: 10.2 g/dL — ABNORMAL LOW (ref 12.0–15.0)
MCH: 27.2 pg (ref 26.0–34.0)
MCHC: 32.5 g/dL (ref 30.0–36.0)
MCV: 83.7 fL (ref 80.0–100.0)
Platelets: 137 10*3/uL — ABNORMAL LOW (ref 150–400)
RBC: 3.75 MIL/uL — ABNORMAL LOW (ref 3.87–5.11)
RDW: 14.5 % (ref 11.5–15.5)
WBC: 4.2 10*3/uL (ref 4.0–10.5)
nRBC: 0 % (ref 0.0–0.2)

## 2021-01-05 LAB — BASIC METABOLIC PANEL
Anion gap: 4 — ABNORMAL LOW (ref 5–15)
BUN: 22 mg/dL (ref 8–23)
CO2: 33 mmol/L — ABNORMAL HIGH (ref 22–32)
Calcium: 9.3 mg/dL (ref 8.9–10.3)
Chloride: 103 mmol/L (ref 98–111)
Creatinine, Ser: 0.52 mg/dL (ref 0.44–1.00)
GFR, Estimated: >60 mL/min (ref >60)
Glucose, Bld: 82 mg/dL (ref 70–99)
Potassium: 4 mmol/L (ref 3.5–5.1)
Sodium: 140 mmol/L (ref 135–145)

## 2021-01-05 LAB — MAGNESIUM: Magnesium: 2.2 mg/dL (ref 1.7–2.4)

## 2021-01-05 MED ORDER — ENOXAPARIN SODIUM 40 MG/0.4ML IJ SOSY
40.0000 mg | PREFILLED_SYRINGE | INTRAMUSCULAR | Status: DC
  Administered 2021-01-05 – 2021-02-18 (×44): 40 mg via SUBCUTANEOUS
  Filled 2021-01-05 (×44): qty 0.4

## 2021-01-05 NOTE — Progress Notes (Signed)
Stacy Moran  ZOX:[id number] DOB: 29-Mar-1956 DOA: 12/14/2020 PCP: [name], MD    Brief Narrative:  [phone] with a history of ruptured intracranial aneurysm and subsequent SHD, bilateral true vocal cord paralysis, chronic tracheostomy and PEG, HTN, epilepsy, hypothyroidism, and VP shunt for recurrent hydrocephalus who presented from her SNF due to respiratory distress.  Evidently patient's tracheostomy tube become dislodged accidentally, and she was in severe respiratory distress.     On arrival, she was emergently taken to the OR, intubated, and her tracheostomy tube was replaced by ENT, Dr. Pryor Ochoa after dilation of the existing fistula.  She has subsequently stabilized.  Has been ready for discharge to skilled nursing facility for many days now,  however no bed has been available.  Disposition: Awaiting SNF placement   Consultants:  ENT  Code Status: FULL CODE  Antimicrobials:  None presently  DVT prophylaxis: Lovenox resumed 10/10  Subjective: No acute events reported overnight.  Afebrile.  Vital signs stable.  Assessment & Plan:  Acute hypoxic respiratory failure - displaced chronic tracheostomy - chronic bilateral vocal cord dysfunction ENT emergently took the patient to the OR for replacement of her chronic tracheostomy at the time of presentation -has been educated on care of new trach by RT -stable from a respiratory standpoint -follow-up with Dr. Pryor Ochoa from ENT in 1 month  Dysphagia due to bilateral vocal cord dysfunction Continue tube feeding per bolus protocol - has been evaluated by RD  DM2 with hypoglycemia Appears to be very sensitive to insulin - hypoglycemia has resolved with discontinuation of SSI - A1c 6.2 - CBGs controlled   Essential HTN BP is stable  Thrombocytopenia Lovenox was held when platelet count dropped below 100,000 - Plt now stable > 100  Seizure disorder Well-controlled -continue Keppra  History of subarachnoid and subdural  hematomas with recurrent hydrocephalus status post VP shunt  Mood disorder Continue usual Seroquel dose  Postoperative hypothyroidism Continue usual Synthroid dose    Family Communication: No family present at time of exam Status is: Inpatient  Remains inpatient appropriate because:Unsafe d/c plan  Dispo: The patient is from: SNF              Anticipated d/c is to: SNF              Patient currently is medically stable to d/c.   Difficult to place patient Yes  Objective: Blood pressure (!) 143/87, pulse 78, temperature 98.6 F (37 C), temperature source Oral, resp. rate 16, height 5\' 6"  (1.676 m), weight 58.7 kg, SpO2 100 %.  Intake/Output Summary (Last 24 hours) at 01/05/2021 0839 Last data filed at 01/04/2021 1815 Gross per 24 hour  Intake --  Output 3 ml  Net -3 ml    Filed Weights   01/03/21 0500 01/04/21 0500 01/05/21 0500  Weight: 59.1 kg 58.5 kg 58.7 kg    Examination: General: No acute respiratory distress  CBC: Recent Labs  Lab 01/01/21 0328 01/05/21 0501  WBC 4.3 4.2  HGB 10.7* 10.2*  HCT 33.7* 31.4*  MCV 83.8 83.7  PLT 141* 137*    Basic Metabolic Panel: Recent Labs  Lab 01/01/21 0328 01/05/21 0501  NA 142 140  K 4.0 4.0  CL 103 103  CO2 33* 33*  GLUCOSE 97 82  BUN 27* 22  CREATININE 0.52 0.52  CALCIUM 9.5 9.3  MG  --  2.2    GFR: Estimated Creatinine Clearance: 66.7 mL/min (by C-G formula based on SCr of 0.52 mg/dL).  HbA1C: Hgb A1c MFr Bld  Date/Time Value Ref Range Status  12/14/2020 10:20 AM 6.2 (H) 4.8 - 5.6 % Final    Comment:    (NOTE) Pre diabetes:          5.7%-6.4%  Diabetes:              >6.4%  Glycemic control for   <7.0% adults with diabetes   04/22/2020 03:10 PM 5.8 (H) 4.8 - 5.6 % Final    Comment:    (NOTE) Pre diabetes:          5.7%-6.4%  Diabetes:              >6.4%  Glycemic control for   <7.0% adults with diabetes     CBG: Recent Labs  Lab 01/04/21 0810 01/04/21 1130 01/04/21 1719  01/04/21 2328 01/05/21 0521  GLUCAP 111* 82 106* 123* 91     Scheduled Meds:  Chlorhexidine Gluconate Cloth  6 each Topical Daily   feeding supplement (OSMOLITE 1.2 CAL)  355 mL Per Tube 5 X Daily   free water  150 mL Per Tube Q6H   guaiFENesin  10 mL Per Tube Q8H   levETIRAcetam  750 mg Per Tube BID   levothyroxine  100 mcg Per Tube Q0600   polyethylene glycol  17 g Per Tube BID   QUEtiapine  50 mg Per Tube BID   senna  1 tablet Per Tube Daily     LOS: 22 days   Cherene Altes, MD [facility] Office  [phone] Pager - Text Page per Shea Evans  If 7PM-7AM, please contact night-coverage per Amion 01/05/2021, 8:39 AM

## 2021-01-05 NOTE — TOC Progression Note (Signed)
Transition of Care [facility]) - Progression Note    Patient Details  Name: Stacy Moran MRN: [id number] Date of Birth: 1956/01/10  [facility] of [facility] CM/SW Contact  [name], RN Phone Number: 01/05/2021, 1:30 PM  Clinical Narrative:    RNCM reached out to [facility] to see if he had any facilities that accept trach patient's.  Audelia Acton recommends that I send referrals to [facility], and [facility] and rehab.  Referrals sent through the hub.     Expected Discharge Plan: [place] Barriers to Discharge: No SNF bed  Expected Discharge Plan and Services Expected Discharge Plan: [place]   Discharge Planning Services: CM Consult Post Acute Care Choice: [place] Living arrangements for the past 2 months: [place]                 DME Arranged: N/A DME Agency: NA       HH Arranged: NA HH Agency: NA         Social Determinants of Health (SDOH) Interventions    Readmission Risk Interventions Readmission Risk Prevention Plan 12/31/2020  Transportation Screening Complete  Medication Review Press photographer) Complete  PCP or Specialist appointment within 3-5 days of discharge Complete  HRI or Home Care Consult Complete  SW Recovery Care/Counseling Consult Complete  Palliative Care Screening Not [place] Complete

## 2021-01-05 NOTE — Progress Notes (Signed)
Tracheostomy care completed. Trach suctioned with small amount of white and clear secretions.  Trach status: secured with Passy [place] Speaking valve in place. Trach site drainage and oozing secretions. Site care: cleansed, dried, and dressing applied. Tie assessment: clean, dry, and secure. Checklist posted at the head of the bed.

## 2021-01-06 DIAGNOSIS — J9601 Acute respiratory failure with hypoxia: Secondary | ICD-10-CM | POA: Diagnosis not present

## 2021-01-06 DIAGNOSIS — J9503 Malfunction of tracheostomy stoma: Secondary | ICD-10-CM | POA: Diagnosis not present

## 2021-01-06 LAB — GLUCOSE, CAPILLARY
Glucose-Capillary: 191 mg/dL — ABNORMAL HIGH (ref 70–99)
Glucose-Capillary: 86 mg/dL (ref 70–99)
Glucose-Capillary: 91 mg/dL (ref 70–99)
Glucose-Capillary: 94 mg/dL (ref 70–99)

## 2021-01-06 NOTE — Progress Notes (Signed)
Stacy Moran  PQZ:[id number] DOB: 1956/01/21 DOA: 12/14/2020 PCP: [name], MD    Brief Narrative:  [phone] with a history of ruptured intracranial aneurysm and subsequent SHD/SAH, bilateral true vocal cord paralysis, chronic tracheostomy and PEG, HTN, epilepsy, hypothyroidism, and VP shunt for recurrent hydrocephalus who presented from her SNF due to respiratory distress.  Evidently patient's tracheostomy tube became dislodged, and she was in severe respiratory distress.     On arrival, she was emergently taken to the OR, intubated, and her tracheostomy tube was replaced by ENT, Dr. Pryor Ochoa after dilation of the existing fistula.  She has subsequently stabilized.  Has been ready for discharge to skilled nursing facility for many days now,  however no bed has been available.  Disposition: Awaiting SNF placement   Consultants:  ENT  Code Status: FULL CODE  Antimicrobials:  None presently  DVT prophylaxis: Lovenox resumed 10/10  Subjective: Afebrile.  Vital signs stable.  No acute events reported overnight.  Assessment & Plan:  Acute hypoxic respiratory failure - displaced chronic tracheostomy - chronic bilateral vocal cord dysfunction ENT emergently took the patient to the OR for replacement of her chronic tracheostomy at the time of presentation -has been educated on care of new trach by RT -stable from a respiratory standpoint -follow-up with Dr. Pryor Ochoa from ENT in 1 month  Dysphagia due to bilateral vocal cord dysfunction Continue tube feeding per bolus protocol - has been evaluated by RD  DM2 with hypoglycemia Appears to be very sensitive to insulin - hypoglycemia resolved with discontinuation of SSI - A1c 6.2 - CBGs controlled   Essential HTN BP is stable  Thrombocytopenia Lovenox was held when platelet count dropped below 100,000 - Plt now stable > 100  Seizure disorder Well-controlled -continue Keppra  History of subarachnoid and subdural hematomas with  recurrent hydrocephalus status post VP shunt  Mood disorder Continue usual Seroquel dose  Postoperative hypothyroidism Continue usual Synthroid dose    Family Communication: No family present at time of exam Status is: Inpatient  Remains inpatient appropriate because:Unsafe d/c plan  Dispo: The patient is from: SNF              Anticipated d/c is to: SNF              Patient currently is medically stable to d/c.   Difficult to place patient Yes  Objective: Blood pressure 137/87, pulse 83, temperature 99 F (37.2 C), resp. rate 16, height 5\' 6"  (1.676 m), weight 58.7 kg, SpO2 100 %.  Intake/Output Summary (Last 24 hours) at 01/06/2021 0916 Last data filed at 01/05/2021 2200 Gross per 24 hour  Intake 335 ml  Output --  Net 335 ml    Filed Weights   01/03/21 0500 01/04/21 0500 01/05/21 0500  Weight: 59.1 kg 58.5 kg 58.7 kg    Examination: General: No acute respiratory distress -resting comfortably in bed  CBC: Recent Labs  Lab 01/01/21 0328 01/05/21 0501  WBC 4.3 4.2  HGB 10.7* 10.2*  HCT 33.7* 31.4*  MCV 83.8 83.7  PLT 141* 137*    Basic Metabolic Panel: Recent Labs  Lab 01/01/21 0328 01/05/21 0501  NA 142 140  K 4.0 4.0  CL 103 103  CO2 33* 33*  GLUCOSE 97 82  BUN 27* 22  CREATININE 0.52 0.52  CALCIUM 9.5 9.3  MG  --  2.2    GFR: Estimated Creatinine Clearance: 66.7 mL/min (by C-G formula based on SCr of 0.52 mg/dL).   HbA1C: Hgb  A1c MFr Bld  Date/Time Value Ref Range Status  12/14/2020 10:20 AM 6.2 (H) 4.8 - 5.6 % Final    Comment:    (NOTE) Pre diabetes:          5.7%-6.4%  Diabetes:              >6.4%  Glycemic control for   <7.0% adults with diabetes   04/22/2020 03:10 PM 5.8 (H) 4.8 - 5.6 % Final    Comment:    (NOTE) Pre diabetes:          5.7%-6.4%  Diabetes:              >6.4%  Glycemic control for   <7.0% adults with diabetes     CBG: Recent Labs  Lab 01/05/21 0521 01/05/21 1122 01/05/21 1756 01/06/21 0019  01/06/21 0616  GLUCAP 91 166* 113* 191* 86     Scheduled Meds:  Chlorhexidine Gluconate Cloth  6 each Topical Daily   enoxaparin (LOVENOX) injection  40 mg Subcutaneous Q24H   feeding supplement (OSMOLITE 1.2 CAL)  355 mL Per Tube 5 X Daily   free water  150 mL Per Tube Q6H   guaiFENesin  10 mL Per Tube Q8H   levETIRAcetam  750 mg Per Tube BID   levothyroxine  100 mcg Per Tube Q0600   polyethylene glycol  17 g Per Tube BID   QUEtiapine  50 mg Per Tube BID   senna  1 tablet Per Tube Daily     LOS: 23 days   Cherene Altes, MD [facility] Office  [phone] Pager - Text Page per Shea Evans  If 7PM-7AM, please contact night-coverage per Amion 01/06/2021, 9:16 AM

## 2021-01-07 DIAGNOSIS — J9601 Acute respiratory failure with hypoxia: Secondary | ICD-10-CM | POA: Diagnosis not present

## 2021-01-07 LAB — GLUCOSE, CAPILLARY
Glucose-Capillary: 121 mg/dL — ABNORMAL HIGH (ref 70–99)
Glucose-Capillary: 86 mg/dL (ref 70–99)
Glucose-Capillary: 91 mg/dL (ref 70–99)
Glucose-Capillary: 172 mg/dL — ABNORMAL HIGH (ref 70–99)

## 2021-01-07 NOTE — Progress Notes (Signed)
Nutrition Follow-up  DOCUMENTATION CODES:  Severe malnutrition in context of chronic illness  INTERVENTION:  Continue TF via PEG. 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 - ongoing  GOAL:  Patient will meet greater than or equal to 90% of their needs - being addressed with TF  MONITOR:  TF tolerance, Labs, Weight trends, Vent status, I & O's  REASON FOR ASSESSMENT:  Consult Enteral/tube feeding initiation and management  ASSESSMENT:  65 yo female with PMH of vocal cord paralysis with chronic tracheostomy, HTN, hx subarachnoid hemorrhage, chronic PEG tube, anxiety, and ventriculoperitoneal shunt for recurrent hydrocephalus presented to ED from [facility] with respiratory distress. Patient was found in respiratory distress in her room with a tracheostomy tube on the floor.  Pt continues to be medically stable and CM working on placement. Bolus feeds were increased to 5x daily last week. Weight appears to be stable on this regimen and pt showing good tolerance. Most recent lab work WNL. Will Continue current nutrition plan.  Nutritionally Relevant Medications: Scheduled Meds:  feeding supplement (OSMOLITE 1.2 CAL)  355 mL Per Tube 5 X Daily   free water  150 mL Per Tube Q6H   polyethylene glycol  17 g Per Tube BID   senna  1 tablet Per Tube Daily   PRN Meds: docusate, polyethylene glycol  Labs Reviewed  NUTRITION - FOCUSED PHYSICAL EXAM: Flowsheet Row Most Recent Value  Orbital Region Mild depletion  Upper Arm Region Severe depletion  Thoracic and Lumbar Region Moderate depletion  Buccal Region Moderate depletion  Temple Region  Moderate depletion  Clavicle Bone Region Moderate depletion  Clavicle and Acromion Bone Region Moderate depletion  Scapular Bone Region Mild depletion  Dorsal Hand Moderate depletion  Patellar Region Severe depletion  Anterior Thigh Region Severe depletion  Posterior Calf Region Severe depletion  Edema (RD Assessment) None  Hair Reviewed  Eyes Reviewed  Mouth Reviewed  Skin Reviewed  Nails Reviewed   Diet Order:   Diet Order             Diet NPO time specified  Diet effective now                   EDUCATION NEEDS:  Education needs have been addressed  Skin:  Skin Assessment: Reviewed RN Assessment  Last BM:  10/11  Height:  Ht Readings from Last 1 Encounters:  12/14/20 5\' 6"  (1.676 m)   Weight:  Wt Readings from Last 1 Encounters:  01/07/21 58.1 kg   Ideal Body Weight:  59.1 kg  BMI:  Body mass index is 20.67 kg/m.  Estimated Nutritional Needs:  Kcal:  1800-2000 kcal Protein:  90-100 g/d Fluid:  1700-1900 mL/d   Ranell Patrick, RD, LDN Clinical Dietitian Pager on [place]

## 2021-01-07 NOTE — Progress Notes (Addendum)
Stacy Moran  [id number]:[id number] DOB: 02/13/1956 DOA: 12/14/2020 PCP: [name], MD    Brief Narrative:  [phone] with a history of ruptured intracranial aneurysm and subsequent SHD/SAH, bilateral true vocal cord paralysis, chronic tracheostomy and PEG, HTN, epilepsy, hypothyroidism, and VP shunt for recurrent hydrocephalus who presented from her SNF due to respiratory distress.  Evidently patient's tracheostomy tube became dislodged, and she was in severe respiratory distress.     On arrival, she was emergently taken to the OR, intubated, and her tracheostomy tube was replaced by ENT, Dr. Pryor Ochoa after dilation of the existing fistula.  She has subsequently stabilized.  Has been ready for discharge to skilled nursing facility for many days now,  however no bed has been available.  Disposition: Awaiting SNF placement   Consultants:  ENT  Code Status: FULL CODE  Antimicrobials:  None presently  DVT prophylaxis: Lovenox resumed 10/10  Subjective: Afebrile.  Vital signs stable.  No acute events reported overnight.  Assessment & Plan:  Acute hypoxic respiratory failure - displaced chronic tracheostomy - chronic bilateral vocal cord dysfunction ENT emergently took the patient to the OR for replacement of her chronic tracheostomy at the time of presentation -has been educated on care of new trach by RT -stable from a respiratory standpoint -follow-up with Dr. Pryor Ochoa from ENT in 1 month. TOC today informs [place] that SNF will not take patient with cuffed trach. Was previously on cuffless trach, that likely led to dislodgement. Discussed w/ Dr. Pryor Ochoa, he will assist respiratory in placing cuffless trach tomorrow, as that appears to be the only means toward discharging patient to SNF.  Dysphagia due to bilateral vocal cord dysfunction Continue tube feeding per bolus protocol - has been evaluated by RD  DM2 with hypoglycemia Appears to be very sensitive to insulin - hypoglycemia resolved  with discontinuation of SSI - A1c 6.2 - CBGs controlled   Essential HTN BP is stable  Thrombocytopenia Lovenox was initially held when platelet count dropped below 100,000 - Plt now stable > 100  Seizure disorder Well-controlled -continue Keppra  History of subarachnoid and subdural hematomas with recurrent hydrocephalus status post VP shunt  Mood disorder Continue usual Seroquel dose  Postoperative hypothyroidism Continue usual Synthroid dose    Family Communication: No family present at time of exam Status is: Inpatient  Remains inpatient appropriate because:Unsafe d/c plan  Dispo: The patient is from: SNF              Anticipated d/c is to: SNF              Patient currently is medically stable to d/c.   Difficult to place patient Yes  Objective: Blood pressure (!) 146/78, pulse 89, temperature 98.6 F (37 C), temperature source Oral, resp. rate 16, height 5\' 6"  (1.676 m), weight 58.1 kg, SpO2 100 %. No intake or output data in the 24 hours ending 01/07/21 1124 Filed Weights   01/04/21 0500 01/05/21 0500 01/07/21 0500  Weight: 58.5 kg 58.7 kg 58.1 kg    Examination: General: No acute respiratory distress -resting comfortably in bed. Chronic cough.  Lungs: few scattered rhonchi Cardiac: rrr, no murmur Abdomen: feeding tube in place c/d/I, soft Ext: no edema  CBC: Recent Labs  Lab 01/01/21 0328 01/05/21 0501  WBC 4.3 4.2  HGB 10.7* 10.2*  HCT 33.7* 31.4*  MCV 83.8 83.7  PLT 141* 962*   Basic Metabolic Panel: Recent Labs  Lab 01/01/21 0328 01/05/21 0501  NA 142 140  K 4.0  4.0  CL 103 103  CO2 33* 33*  GLUCOSE 97 82  BUN 27* 22  CREATININE 0.52 0.52  CALCIUM 9.5 9.3  MG  --  2.2   GFR: Estimated Creatinine Clearance: 66 mL/min (by C-G formula based on SCr of 0.52 mg/dL).   HbA1C: Hgb A1c MFr Bld  Date/Time Value Ref Range Status  12/14/2020 10:20 AM 6.2 (H) 4.8 - 5.6 % Final    Comment:    (NOTE) Pre diabetes:           5.7%-6.4%  Diabetes:              >6.4%  Glycemic control for   <7.0% adults with diabetes   04/22/2020 03:10 PM 5.8 (H) 4.8 - 5.6 % Final    Comment:    (NOTE) Pre diabetes:          5.7%-6.4%  Diabetes:              >6.4%  Glycemic control for   <7.0% adults with diabetes     CBG: Recent Labs  Lab 01/06/21 0616 01/06/21 1146 01/06/21 1634 01/07/21 0114 01/07/21 0508  GLUCAP 86 94 91 121* 86    Scheduled Meds:  Chlorhexidine Gluconate Cloth  6 each Topical Daily   enoxaparin (LOVENOX) injection  40 mg Subcutaneous Q24H   feeding supplement (OSMOLITE 1.2 CAL)  355 mL Per Tube 5 X Daily   free water  150 mL Per Tube Q6H   guaiFENesin  10 mL Per Tube Q8H   levETIRAcetam  750 mg Per Tube BID   levothyroxine  100 mcg Per Tube Q0600   polyethylene glycol  17 g Per Tube BID   QUEtiapine  50 mg Per Tube BID   senna  1 tablet Per Tube Daily     LOS: 24 days   Laurey Arrow, MD [facility]   If 7PM-7AM, please contact night-coverage per Amion 01/07/2021, 11:24 AM

## 2021-01-07 NOTE — TOC Progression Note (Signed)
Transition of Care [facility]) - Progression Note    Patient Details  Name: Stacy Moran MRN: [id number] Date of Birth: 1956-02-04  Transition of Care [facility]) CM/SW Contact  Eileen Stanford, LCSW Phone Number: 01/07/2021, 1:26 PM  Clinical Narrative:   Magda Paganini with APS is assigned to pt's case. CSW reached out to [place] and she is going to reach out to pt's Legal Guardian to determine plan.    Expected Discharge Plan: [place] Barriers to Discharge: No SNF bed  Expected Discharge Plan and Services Expected Discharge Plan: [place]   Discharge Planning Services: CM Consult Post Acute Care Choice: [place] Living arrangements for the past 2 months: [place]                 DME Arranged: N/A DME Agency: NA       HH Arranged: NA HH Agency: NA         Social Determinants of Health (SDOH) Interventions    Readmission Risk Interventions Readmission Risk Prevention Plan 12/31/2020  Transportation Screening Complete  Medication Review Press photographer) Complete  PCP or Specialist appointment within 3-5 days of discharge Complete  HRI or Home Care Consult Complete  SW Recovery Care/Counseling Consult Complete  Palliative Care Screening Not [place] Complete

## 2021-01-07 NOTE — TOC Progression Note (Signed)
Transition of Care [facility]) - Progression Note    Patient Details  Name: Stacy Moran MRN: [id number] Date of Birth: [birth year]  Transition of Care [facility]) CM/SW [place], LCSW Phone Number: 01/07/2021, 12:58 PM  Clinical Narrative:   Facilities will not take a pt with a cuffed trach, it will need to be uncuffed. MD notified.    Expected Discharge Plan: [place] Barriers to Discharge: No SNF bed  Expected Discharge Plan and Services Expected Discharge Plan: [place]   Discharge Planning Services: CM Consult Post Acute Care Choice: [place] Living arrangements for the past 2 months: [place]                 DME Arranged: N/A DME Agency: NA       HH Arranged: NA HH Agency: NA         Social Determinants of Health (SDOH) Interventions    Readmission Risk Interventions Readmission Risk Prevention Plan 12/31/2020  Transportation Screening Complete  Medication Review Press photographer) Complete  PCP or Specialist appointment within 3-5 days of discharge Complete  HRI or Home Care Consult Complete  SW Recovery Care/Counseling Consult Complete  Palliative Care Screening Not [place] Complete

## 2021-01-08 DIAGNOSIS — J9601 Acute respiratory failure with hypoxia: Secondary | ICD-10-CM | POA: Diagnosis not present

## 2021-01-08 LAB — GLUCOSE, CAPILLARY
Glucose-Capillary: 85 mg/dL (ref 70–99)
Glucose-Capillary: 143 mg/dL — ABNORMAL HIGH (ref 70–99)

## 2021-01-08 LAB — CBC
HCT: 34.9 % — ABNORMAL LOW (ref 36.0–46.0)
Hemoglobin: 10.9 g/dL — ABNORMAL LOW (ref 12.0–15.0)
MCH: 25.7 pg — ABNORMAL LOW (ref 26.0–34.0)
MCHC: 31.2 g/dL (ref 30.0–36.0)
MCV: 82.3 fL (ref 80.0–100.0)
Platelets: 136 10*3/uL — ABNORMAL LOW (ref 150–400)
RBC: 4.24 MIL/uL (ref 3.87–5.11)
RDW: 14.5 % (ref 11.5–15.5)
WBC: 4.4 10*3/uL (ref 4.0–10.5)
nRBC: 0 % (ref 0.0–0.2)

## 2021-01-08 LAB — BASIC METABOLIC PANEL
Anion gap: 6 (ref 5–15)
BUN: 21 mg/dL (ref 8–23)
CO2: 32 mmol/L (ref 22–32)
Calcium: 9.3 mg/dL (ref 8.9–10.3)
Chloride: 101 mmol/L (ref 98–111)
Creatinine, Ser: 0.48 mg/dL (ref 0.44–1.00)
GFR, Estimated: >60 mL/min (ref >60)
Glucose, Bld: 88 mg/dL (ref 70–99)
Potassium: 4 mmol/L (ref 3.5–5.1)
Sodium: 139 mmol/L (ref 135–145)

## 2021-01-08 LAB — HEPATITIS C ANTIBODY: HCV Ab: NONREACTIVE

## 2021-01-08 LAB — PATHOLOGIST SMEAR REVIEW

## 2021-01-08 LAB — SAVE SMEAR(SSMR), FOR PROVIDER SLIDE REVIEW

## 2021-01-08 NOTE — Progress Notes (Addendum)
Stacy Moran  YTK:[id number] DOB: Nov 23, 1955 DOA: 12/14/2020 PCP: [name], MD    Brief Narrative:  [phone] with a history of ruptured intracranial aneurysm and subsequent SHD/SAH, bilateral true vocal cord paralysis, chronic tracheostomy and PEG, HTN, epilepsy, hypothyroidism, and VP shunt for recurrent hydrocephalus who presented from her SNF due to respiratory distress.  Evidently patient's tracheostomy tube became dislodged, and she was in severe respiratory distress.     On arrival, she was emergently taken to the OR, intubated, and her tracheostomy tube was replaced by ENT, Dr. Pryor Ochoa after dilation of the existing fistula.  She has subsequently stabilized.  Has been ready for discharge to skilled nursing facility for many days now,  however no bed has been available.  Disposition: Awaiting SNF placement. Patient has an [place] guardian.  Consultants:  ENT  Code Status: FULL CODE  Antimicrobials:  None presently  DVT prophylaxis: Lovenox resumed 10/10  Subjective: Afebrile.  Vital signs stable.  No acute events reported overnight.  Assessment & Plan:  Acute hypoxic respiratory failure - displaced chronic tracheostomy - chronic bilateral vocal cord dysfunction ENT emergently took the patient to the OR for replacement of her chronic tracheostomy at the time of presentation -has been educated on care of new trach by RT -stable from a respiratory standpoint -follow-up with Dr. Pryor Ochoa from ENT in 1 month. TOC today informs [place] that SNF will not take patient with cuffed trach. Was previously on cuffless trach, that likely led to dislodgement. Discussed w/ Dr. Pryor Ochoa, he will assist respiratory in placing cuffless trach today, as that appears to be the only means toward discharging patient to SNF.  Dysphagia due to bilateral vocal cord dysfunction Continue tube feeding per bolus protocol - has been evaluated by RD  DM2 with hypoglycemia Appears to be very sensitive to insulin -  hypoglycemia resolved with discontinuation of SSI - A1c 6.2 - CBGs controlled   Essential HTN BP is stable  Thrombocytopenia Lovenox was initially held when platelet count dropped below 100,000 - Plt now stable > 100. Hiv neg 2021 - will f/u smear, hcv  Seizure disorder Well-controlled -continue Keppra  History of subarachnoid and subdural hematomas with recurrent hydrocephalus status post VP shunt  Mood disorder Continue usual Seroquel dose  Postoperative hypothyroidism Continue usual Synthroid dose    Family Communication: No family present at time of exam Status is: Inpatient  Remains inpatient appropriate because:Unsafe d/c plan  Dispo: The patient is from: SNF              Anticipated d/c is to: SNF              Patient currently is medically stable to d/c.   Difficult to place patient Yes  Objective: Blood pressure 123/86, pulse 82, temperature 98.8 F (37.1 C), temperature source Oral, resp. rate 18, height 5\' 6"  (1.676 m), weight 58.1 kg, SpO2 95 %. No intake or output data in the 24 hours ending 01/08/21 1248 Filed Weights   01/04/21 0500 01/05/21 0500 01/07/21 0500  Weight: 58.5 kg 58.7 kg 58.1 kg    Examination: General: No acute respiratory distress -resting comfortably in bed.  Lungs: few scattered rhonchi Cardiac: rrr, no murmur Abdomen: feeding tube in place c/d/I, soft Ext: no edema  CBC: Recent Labs  Lab 01/05/21 0501 01/08/21 0444  WBC 4.2 4.4  HGB 10.2* 10.9*  HCT 31.4* 34.9*  MCV 83.7 82.3  PLT 137* 220*   Basic Metabolic Panel: Recent Labs  Lab 01/05/21 0501  01/08/21 0444  NA 140 139  K 4.0 4.0  CL 103 101  CO2 33* 32  GLUCOSE 82 88  BUN 22 21  CREATININE 0.52 0.48  CALCIUM 9.3 9.3  MG 2.2  --    GFR: Estimated Creatinine Clearance: 66 mL/min (by C-G formula based on SCr of 0.48 mg/dL).   HbA1C: Hgb A1c MFr Bld  Date/Time Value Ref Range Status  12/14/2020 10:20 AM 6.2 (H) 4.8 - 5.6 % Final    Comment:     (NOTE) Pre diabetes:          5.7%-6.4%  Diabetes:              >6.4%  Glycemic control for   <7.0% adults with diabetes   04/22/2020 03:10 PM 5.8 (H) 4.8 - 5.6 % Final    Comment:    (NOTE) Pre diabetes:          5.7%-6.4%  Diabetes:              >6.4%  Glycemic control for   <7.0% adults with diabetes     CBG: Recent Labs  Lab 01/07/21 0508 01/07/21 1138 01/07/21 1604 01/08/21 0527 01/08/21 0851  GLUCAP 86 91 172* 85 143*    Scheduled Meds:  Chlorhexidine Gluconate Cloth  6 each Topical Daily   enoxaparin (LOVENOX) injection  40 mg Subcutaneous Q24H   feeding supplement (OSMOLITE 1.2 CAL)  355 mL Per Tube 5 X Daily   free water  150 mL Per Tube Q6H   guaiFENesin  10 mL Per Tube Q8H   levETIRAcetam  750 mg Per Tube BID   levothyroxine  100 mcg Per Tube Q0600   polyethylene glycol  17 g Per Tube BID   QUEtiapine  50 mg Per Tube BID   senna  1 tablet Per Tube Daily     LOS: 25 days   Laurey Arrow, MD [facility]   If 7PM-7AM, please contact night-coverage per Amion 01/08/2021, 12:48 PM

## 2021-01-08 NOTE — Progress Notes (Signed)
Tracheostomy tube changed from Size 4 cuffed to size 4 uncuffed due to request by primary team to assist in patient's placement.  Prior to hospitalization, patient's uncuffed tracheostomy tube became dislodged frequently and resulted in patient having respiratory distress.  Patient's tracheostomy tract is widely patent and mature with no resistance at all with removal or replacement of tracheostomy tube and should be easily replaced if it becomes dislodged.  If it frequently becomes dislodged again, recommend replacement with deflated cuffed tube again to help prevent accidental removal.

## 2021-01-09 DIAGNOSIS — J9601 Acute respiratory failure with hypoxia: Secondary | ICD-10-CM | POA: Diagnosis not present

## 2021-01-09 LAB — GLUCOSE, CAPILLARY
Glucose-Capillary: 143 mg/dL — ABNORMAL HIGH (ref 70–99)
Glucose-Capillary: 174 mg/dL — ABNORMAL HIGH (ref 70–99)

## 2021-01-09 NOTE — Progress Notes (Addendum)
Stacy Moran  FVC:[id number] DOB: 06/16/1955 DOA: 12/14/2020 PCP: [name], MD    Brief Narrative:  [phone] with a history of ruptured intracranial aneurysm and subsequent SHD/SAH, bilateral true vocal cord paralysis, chronic tracheostomy and PEG, HTN, epilepsy, hypothyroidism, and VP shunt for recurrent hydrocephalus who presented from her SNF due to respiratory distress.  Evidently patient's tracheostomy tube became dislodged, and she was in severe respiratory distress.     On arrival, she was emergently taken to the OR, intubated, and her tracheostomy tube was replaced by ENT, Dr. Pryor Ochoa after dilation of the existing fistula.  She has subsequently stabilized.  Has been ready for discharge to skilled nursing facility for many days now,  however no bed has been available.  Disposition: Awaiting SNF placement.   Consultants:  ENT  Code Status: FULL CODE  Antimicrobials:  None presently  DVT prophylaxis: Lovenox resumed 10/10  Subjective: Afebrile.  Vital signs stable.  No acute events reported overnight.  Assessment & Plan:  Acute hypoxic respiratory failure - displaced chronic tracheostomy - chronic bilateral vocal cord dysfunction ENT emergently took the patient to the OR for replacement of her chronic tracheostomy at the time of presentation -has been educated on care of new trach by RT -stable from a respiratory standpoint -follow-up with Dr. Pryor Ochoa from ENT in 1 month. Cuffed trach changed to uncuffed trach on 10/13 as no SNF will apparently accept patient with cuffed trach. Dr. Pryor Ochoa performed that. He advises that if it becomes dislodged, that it should be replaced w/ deflated cuffed tube to help prevent accidental removal.  Dysphagia due to bilateral vocal cord dysfunction Continue tube feeding per bolus protocol - has been evaluated by RD  DM2 with hypoglycemia Appears to be very sensitive to insulin - hypoglycemia resolved with discontinuation of SSI - A1c 6.2  - CBGs controlled   Essential HTN BP is stable  Thrombocytopenia Lovenox was initially held when platelet count dropped below 100,000 - Plt now stable > 100. Hiv neg 2021. Hcv neg here, smear unremarkable - monitor  Seizure disorder Well-controlled  -continue Keppra  History of subarachnoid and subdural hematomas with recurrent hydrocephalus status post VP shunt  Mood disorder Continue usual Seroquel dose  Postoperative hypothyroidism Continue usual Synthroid dose    Family Communication: daughter updated telephonically 10/14 Status is: Inpatient  Remains inpatient appropriate because:Unsafe d/c plan  Dispo: The patient is from: SNF              Anticipated d/c is to: SNF              Patient currently is medically stable to d/c.   Difficult to place patient Yes  Objective: Blood pressure 137/84, pulse 85, temperature 98.7 F (37.1 C), temperature source Oral, resp. rate 18, height 5\' 6"  (1.676 m), weight 58.1 kg, SpO2 100 %. No intake or output data in the 24 hours ending 01/09/21 1340 Filed Weights   01/04/21 0500 01/05/21 0500 01/07/21 0500  Weight: 58.5 kg 58.7 kg 58.1 kg    Examination: General: No acute respiratory distress -resting comfortably in bed.  Lungs: few scattered rhonchi Cardiac: rrr, no murmur Abdomen: feeding tube in place c/d/I, soft Ext: no edema  CBC: Recent Labs  Lab 01/05/21 0501 01/08/21 0444  WBC 4.2 4.4  HGB 10.2* 10.9*  HCT 31.4* 34.9*  MCV 83.7 82.3  PLT 137* 665*   Basic Metabolic Panel: Recent Labs  Lab 01/05/21 0501 01/08/21 0444  NA 140 139  K 4.0 4.0  CL 103 101  CO2 33* 32  GLUCOSE 82 88  BUN 22 21  CREATININE 0.52 0.48  CALCIUM 9.3 9.3  MG 2.2  --    GFR: Estimated Creatinine Clearance: 66 mL/min (by C-G formula based on SCr of 0.48 mg/dL).   HbA1C: Hgb A1c MFr Bld  Date/Time Value Ref Range Status  12/14/2020 10:20 AM 6.2 (H) 4.8 - 5.6 % Final    Comment:    (NOTE) Pre diabetes:           5.7%-6.4%  Diabetes:              >6.4%  Glycemic control for   <7.0% adults with diabetes   04/22/2020 03:10 PM 5.8 (H) 4.8 - 5.6 % Final    Comment:    (NOTE) Pre diabetes:          5.7%-6.4%  Diabetes:              >6.4%  Glycemic control for   <7.0% adults with diabetes     CBG: Recent Labs  Lab 01/07/21 1138 01/07/21 1604 01/08/21 0527 01/08/21 0851 01/09/21 0632  GLUCAP 91 172* 85 143* 143*    Scheduled Meds:  Chlorhexidine Gluconate Cloth  6 each Topical Daily   enoxaparin (LOVENOX) injection  40 mg Subcutaneous Q24H   feeding supplement (OSMOLITE 1.2 CAL)  355 mL Per Tube 5 X Daily   free water  150 mL Per Tube Q6H   guaiFENesin  10 mL Per Tube Q8H   levETIRAcetam  750 mg Per Tube BID   levothyroxine  100 mcg Per Tube Q0600   polyethylene glycol  17 g Per Tube BID   QUEtiapine  50 mg Per Tube BID   senna  1 tablet Per Tube Daily     LOS: 26 days   Laurey Arrow, MD [facility]   If 7PM-7AM, please contact night-coverage per Amion 01/09/2021, 1:40 PM

## 2021-01-09 NOTE — TOC Progression Note (Signed)
Transition of Care [facility]) - Progression Note    Patient Details  Name: Aolani Piggott MRN: [id number] Date of Birth: [birth year]  [facility] CM/SW Contact  [name], RN Phone Number: 01/09/2021, 2:06 PM  Clinical Narrative:    Patient's trach has been changed to an uncuffed trach.  Tanya at [facility] reports that they will take her back because patient did come from them but they will not be taking any other trach patients.  They do however need to see how she does here in the hospital with the un cuffed trach and they need more information on frequency of suctioning required.  They may be able to take her Monday, she will review with the medical staff, Monday may be too soon for them.  TOC sending updated clinicals to [facility].    Expected Discharge Plan: [place] Barriers to Discharge: No SNF bed  Expected Discharge Plan and Services Expected Discharge Plan: [place]   Discharge Planning Services: CM Consult Post Acute Care Choice: [place] Living arrangements for the past 2 months: [place]                 DME Arranged: N/A DME Agency: NA       HH Arranged: NA HH Agency: NA         Social Determinants of Health (SDOH) Interventions    Readmission Risk Interventions Readmission Risk Prevention Plan 12/31/2020  Transportation Screening Complete  Medication Review Press photographer) Complete  PCP or Specialist appointment within 3-5 days of discharge Complete  HRI or Home Care Consult Complete  SW Recovery Care/Counseling Consult Complete  Palliative Care Screening Not [place] Complete

## 2021-01-10 DIAGNOSIS — J9601 Acute respiratory failure with hypoxia: Secondary | ICD-10-CM | POA: Diagnosis not present

## 2021-01-10 LAB — GLUCOSE, CAPILLARY: Glucose-Capillary: 117 mg/dL — ABNORMAL HIGH (ref 70–99)

## 2021-01-10 NOTE — Progress Notes (Signed)
Stacy Moran  VQM:[id number] DOB: September 25, 1955 DOA: 12/14/2020 PCP: [name], MD    Brief Narrative:  [phone] with a history of ruptured intracranial aneurysm and subsequent SHD/SAH, bilateral true vocal cord paralysis, chronic tracheostomy and PEG, HTN, epilepsy, hypothyroidism, and VP shunt for recurrent hydrocephalus who presented from her SNF due to respiratory distress.  Evidently patient's tracheostomy tube became dislodged, and she was in severe respiratory distress.     On arrival, she was emergently taken to the OR, intubated, and her tracheostomy tube was replaced by ENT, Dr. Pryor Ochoa after dilation of the existing fistula.  She has subsequently stabilized.  Has been ready for discharge to skilled nursing facility for many days now,  however no bed has been available.  Disposition: Awaiting SNF placement.   Consultants:  ENT  Code Status: FULL CODE  Antimicrobials:  None presently  DVT prophylaxis: Lovenox resumed 10/10  Subjective: Afebrile.  Vital signs stable.  No acute events reported overnight.  Assessment & Plan:  Acute hypoxic respiratory failure - displaced chronic tracheostomy - chronic bilateral vocal cord dysfunction ENT emergently took the patient to the OR for replacement of her chronic tracheostomy at the time of presentation -has been educated on care of new trach by RT -stable from a respiratory standpoint -follow-up with Dr. Pryor Ochoa from ENT in 1 month. Cuffed trach changed to uncuffed trach on 10/13 as no SNF will apparently accept patient with cuffed trach. Dr. Pryor Ochoa performed that. He advises that if it becomes dislodged, that it should be replaced w/ deflated cuffed tube to help prevent accidental removal.  Dysphagia due to bilateral vocal cord dysfunction Continue tube feeding per bolus protocol - has been evaluated by RD  DM2 with hypoglycemia Appears to be very sensitive to insulin - hypoglycemia resolved with discontinuation of SSI - A1c 6.2  - CBGs controlled   Essential HTN BP is stable  Thrombocytopenia Lovenox was initially held when platelet count dropped below 100,000 - Plt now stable > 100. Hiv neg 2021. Hcv neg here, smear unremarkable - monitor  Seizure disorder Well-controlled  -continue Keppra  History of subarachnoid and subdural hematomas with recurrent hydrocephalus status post VP shunt  Mood disorder Continue usual Seroquel dose  Postoperative hypothyroidism Continue usual Synthroid dose    Family Communication: daughter updated telephonically 10/14 Status is: Inpatient  Remains inpatient appropriate because:Unsafe d/c plan  Dispo: The patient is from: SNF              Anticipated d/c is to: SNF              Patient currently is medically stable to d/c.   Difficult to place patient Yes  Objective: Blood pressure 125/75, pulse 89, temperature 99.3 F (37.4 C), temperature source Oral, resp. rate 18, height 5\' 6"  (1.676 m), weight 59.6 kg, SpO2 99 %.  Intake/Output Summary (Last 24 hours) at 01/10/2021 1354 Last data filed at 01/10/2021 1100 Gross per 24 hour  Intake 365 ml  Output --  Net 365 ml   Filed Weights   01/05/21 0500 01/07/21 0500 01/10/21 0500  Weight: 58.7 kg 58.1 kg 59.6 kg    Examination: General: No acute respiratory distress -resting comfortably in bed.  Lungs: few scattered rhonchi Cardiac: rrr, no murmur Abdomen: feeding tube in place c/d/I, soft Ext: no edema  CBC: Recent Labs  Lab 01/05/21 0501 01/08/21 0444  WBC 4.2 4.4  HGB 10.2* 10.9*  HCT 31.4* 34.9*  MCV 83.7 82.3  PLT 137* 136*  Basic Metabolic Panel: Recent Labs  Lab 01/05/21 0501 01/08/21 0444  NA 140 139  K 4.0 4.0  CL 103 101  CO2 33* 32  GLUCOSE 82 88  BUN 22 21  CREATININE 0.52 0.48  CALCIUM 9.3 9.3  MG 2.2  --    GFR: Estimated Creatinine Clearance: 67.4 mL/min (by C-G formula based on SCr of 0.48 mg/dL).   HbA1C: Hgb A1c MFr Bld  Date/Time Value Ref Range Status   12/14/2020 10:20 AM 6.2 (H) 4.8 - 5.6 % Final    Comment:    (NOTE) Pre diabetes:          5.7%-6.4%  Diabetes:              >6.4%  Glycemic control for   <7.0% adults with diabetes   04/22/2020 03:10 PM 5.8 (H) 4.8 - 5.6 % Final    Comment:    (NOTE) Pre diabetes:          5.7%-6.4%  Diabetes:              >6.4%  Glycemic control for   <7.0% adults with diabetes     CBG: Recent Labs  Lab 01/08/21 0527 01/08/21 0851 01/09/21 0632 01/09/21 2010 01/10/21 0609  GLUCAP 85 143* 143* 174* 117*    Scheduled Meds:  Chlorhexidine Gluconate Cloth  6 each Topical Daily   enoxaparin (LOVENOX) injection  40 mg Subcutaneous Q24H   feeding supplement (OSMOLITE 1.2 CAL)  355 mL Per Tube 5 X Daily   free water  150 mL Per Tube Q6H   guaiFENesin  10 mL Per Tube Q8H   levETIRAcetam  750 mg Per Tube BID   levothyroxine  100 mcg Per Tube Q0600   polyethylene glycol  17 g Per Tube BID   QUEtiapine  50 mg Per Tube BID   senna  1 tablet Per Tube Daily     LOS: 27 days   Laurey Arrow, MD [facility]   If 7PM-7AM, please contact night-coverage per Amion 01/10/2021, 1:54 PM

## 2021-01-11 DIAGNOSIS — J9601 Acute respiratory failure with hypoxia: Secondary | ICD-10-CM | POA: Diagnosis not present

## 2021-01-11 LAB — CBC
HCT: 32.3 % — ABNORMAL LOW (ref 36.0–46.0)
Hemoglobin: 10.1 g/dL — ABNORMAL LOW (ref 12.0–15.0)
MCH: 26 pg (ref 26.0–34.0)
MCHC: 31.3 g/dL (ref 30.0–36.0)
MCV: 83.2 fL (ref 80.0–100.0)
Platelets: 128 10*3/uL — ABNORMAL LOW (ref 150–400)
RBC: 3.88 MIL/uL (ref 3.87–5.11)
RDW: 14.6 % (ref 11.5–15.5)
WBC: 4 10*3/uL (ref 4.0–10.5)
nRBC: 0 % (ref 0.0–0.2)

## 2021-01-11 LAB — BASIC METABOLIC PANEL
Anion gap: 7 (ref 5–15)
BUN: 23 mg/dL (ref 8–23)
CO2: 32 mmol/L (ref 22–32)
Calcium: 9.4 mg/dL (ref 8.9–10.3)
Chloride: 102 mmol/L (ref 98–111)
Creatinine, Ser: 0.52 mg/dL (ref 0.44–1.00)
GFR, Estimated: >60 mL/min (ref >60)
Glucose, Bld: 154 mg/dL — ABNORMAL HIGH (ref 70–99)
Potassium: 4.3 mmol/L (ref 3.5–5.1)
Sodium: 141 mmol/L (ref 135–145)

## 2021-01-11 LAB — GLUCOSE, CAPILLARY
Glucose-Capillary: 142 mg/dL — ABNORMAL HIGH (ref 70–99)
Glucose-Capillary: 83 mg/dL (ref 70–99)
Glucose-Capillary: 94 mg/dL (ref 70–99)
Glucose-Capillary: 92 mg/dL (ref 70–99)

## 2021-01-11 NOTE — Progress Notes (Signed)
Stacy Moran  FOY:[id number] DOB: 1956-02-07 DOA: 12/14/2020 PCP: [name], MD    Brief Narrative:  [phone] with a history of ruptured intracranial aneurysm and subsequent SHD/SAH, bilateral true vocal cord paralysis, chronic tracheostomy and PEG, HTN, epilepsy, hypothyroidism, and VP shunt for recurrent hydrocephalus who presented from her SNF due to respiratory distress.  Evidently patient's tracheostomy tube became dislodged, and she was in severe respiratory distress.     On arrival, she was emergently taken to the OR, intubated, and her tracheostomy tube was replaced by ENT, Dr. Pryor Ochoa after dilation of the existing fistula.  She has subsequently stabilized.  Has been ready for discharge to skilled nursing facility for many days now,  however no bed has been available.  Disposition: Awaiting SNF placement.   Consultants:  ENT  Code Status: FULL CODE  Antimicrobials:  None presently  DVT prophylaxis: Lovenox resumed 10/10  Subjective: Afebrile.  Vital signs stable.  No acute events reported overnight.  Assessment & Plan:  Acute hypoxic respiratory failure - displaced chronic tracheostomy - chronic bilateral vocal cord dysfunction ENT emergently took the patient to the OR for replacement of her chronic tracheostomy at the time of presentation -has been educated on care of new trach by RT -stable from a respiratory standpoint -follow-up with Dr. Pryor Ochoa from ENT in 1 month. Cuffed trach changed to uncuffed trach on 10/13 as no SNF will apparently accept patient with cuffed trach. Dr. Pryor Ochoa performed that. He advises that if it becomes dislodged, that it should be replaced w/ deflated cuffed tube to help prevent accidental removal.  Dysphagia due to bilateral vocal cord dysfunction Continue tube feeding per bolus protocol - has been evaluated by RD  DM2 with hypoglycemia Appears to be very sensitive to insulin - hypoglycemia resolved with discontinuation of SSI - A1c 6.2  - CBGs controlled   Essential HTN BP is stable  Thrombocytopenia Lovenox was initially held when platelet count dropped below 100,000 - Plt now stable > 100. Hiv neg 2021. Hcv neg here, smear unremarkable - monitor  Seizure disorder Well-controlled  -continue Keppra  History of subarachnoid and subdural hematomas with recurrent hydrocephalus status post VP shunt  Mood disorder Continue usual Seroquel dose  Postoperative hypothyroidism Continue usual Synthroid dose    Family Communication: daughter updated telephonically 10/14 Status is: Inpatient  Remains inpatient appropriate because:Unsafe d/c plan  Dispo: The patient is from: SNF              Anticipated d/c is to: SNF, will hear more info from [facility] healthcare tomorrow              Patient currently is medically stable to d/c.   Difficult to place patient Yes  Objective: Blood pressure (!) 142/86, pulse 82, temperature 98.1 F (36.7 C), temperature source Oral, resp. rate 18, height 5\' 6"  (1.676 m), weight 59.5 kg, SpO2 98 %.  Intake/Output Summary (Last 24 hours) at 01/11/2021 1348 Last data filed at 01/10/2021 2000 Gross per 24 hour  Intake 120 ml  Output --  Net 120 ml   Filed Weights   01/07/21 0500 01/10/21 0500 01/11/21 0500  Weight: 58.1 kg 59.6 kg 59.5 kg    Examination: General: No acute respiratory distress -resting comfortably in bed.  Lungs: few scattered rhonchi Cardiac: rrr, no murmur Abdomen: feeding tube in place c/d/I, soft Ext: no edema  CBC: Recent Labs  Lab 01/05/21 0501 01/08/21 0444 01/11/21 0512  WBC 4.2 4.4 4.0  HGB 10.2* 10.9* 10.1*  HCT 31.4* 34.9* 32.3*  MCV 83.7 82.3 83.2  PLT 137* 136* 937*   Basic Metabolic Panel: Recent Labs  Lab 01/05/21 0501 01/08/21 0444 01/11/21 0512  NA 140 139 141  K 4.0 4.0 4.3  CL 103 101 102  CO2 33* 32 32  GLUCOSE 82 88 154*  BUN 22 21 23   CREATININE 0.52 0.48 0.52  CALCIUM 9.3 9.3 9.4  MG 2.2  --   --     GFR: Estimated Creatinine Clearance: 67.4 mL/min (by C-G formula based on SCr of 0.52 mg/dL).   HbA1C: Hgb A1c MFr Bld  Date/Time Value Ref Range Status  12/14/2020 10:20 AM 6.2 (H) 4.8 - 5.6 % Final    Comment:    (NOTE) Pre diabetes:          5.7%-6.4%  Diabetes:              >6.4%  Glycemic control for   <7.0% adults with diabetes   04/22/2020 03:10 PM 5.8 (H) 4.8 - 5.6 % Final    Comment:    (NOTE) Pre diabetes:          5.7%-6.4%  Diabetes:              >6.4%  Glycemic control for   <7.0% adults with diabetes     CBG: Recent Labs  Lab 01/09/21 2010 01/10/21 0609 01/11/21 0531 01/11/21 0903 01/11/21 1222  GLUCAP 174* 117* 142* 83 94    Scheduled Meds:  Chlorhexidine Gluconate Cloth  6 each Topical Daily   enoxaparin (LOVENOX) injection  40 mg Subcutaneous Q24H   feeding supplement (OSMOLITE 1.2 CAL)  355 mL Per Tube 5 X Daily   free water  150 mL Per Tube Q6H   guaiFENesin  10 mL Per Tube Q8H   levETIRAcetam  750 mg Per Tube BID   levothyroxine  100 mcg Per Tube Q0600   polyethylene glycol  17 g Per Tube BID   QUEtiapine  50 mg Per Tube BID   senna  1 tablet Per Tube Daily     LOS: 28 days   Laurey Arrow, MD [facility]   If 7PM-7AM, please contact night-coverage per Amion 01/11/2021, 1:48 PM

## 2021-01-12 DIAGNOSIS — J9601 Acute respiratory failure with hypoxia: Secondary | ICD-10-CM | POA: Diagnosis not present

## 2021-01-12 LAB — GLUCOSE, CAPILLARY: Glucose-Capillary: 85 mg/dL (ref 70–99)

## 2021-01-12 NOTE — Progress Notes (Signed)
Stacy Moran  [id number]:[id number] DOB: September 04, 1955 DOA: 12/14/2020 PCP: [name], MD    Brief Narrative:  [phone] with a history of ruptured intracranial aneurysm and subsequent SHD/SAH, bilateral true vocal cord paralysis, chronic tracheostomy and PEG, HTN, epilepsy, hypothyroidism, and VP shunt for recurrent hydrocephalus who presented from her SNF due to respiratory distress.  Evidently patient's tracheostomy tube became dislodged, and she was in severe respiratory distress.     On arrival, she was emergently taken to the OR, intubated, and her tracheostomy tube was replaced by ENT, Dr. Pryor Ochoa after dilation of the existing fistula.  She has subsequently stabilized.  Has been ready for discharge to skilled nursing facility for many days now,  however no bed has been available.  Disposition: Awaiting SNF placement.   Consultants:  ENT  Code Status: FULL CODE  Antimicrobials:  None presently  DVT prophylaxis: Lovenox resumed 10/10  Subjective: Afebrile.  Vital signs stable.  No acute events reported overnight.  Assessment & Plan:  Acute hypoxic respiratory failure - displaced chronic tracheostomy - chronic bilateral vocal cord dysfunction ENT emergently took the patient to the OR for replacement of her chronic tracheostomy at the time of presentation -has been educated on care of new trach by RT -stable from a respiratory standpoint -follow-up with Dr. Pryor Ochoa from ENT in 1 month. Cuffed trach changed to uncuffed trach on 10/13 as no SNF will apparently accept patient with cuffed trach. Dr. Pryor Ochoa performed that. He advises that if it becomes dislodged, that it should be replaced w/ deflated cuffed tube to help prevent accidental removal.  Dysphagia due to bilateral vocal cord dysfunction Continue tube feeding per bolus protocol - has been evaluated by RD  DM2 with hypoglycemia Appears to be very sensitive to insulin - hypoglycemia resolved with discontinuation of SSI - A1c 6.2  - CBGs controlled   Essential HTN BP is stable  Thrombocytopenia Lovenox was initially held when platelet count dropped below 100,000 - Plt now stable > 100. Hiv neg 2021. Hcv neg here, smear unremarkable - monitor  Seizure disorder Well-controlled  -continue Keppra  History of subarachnoid and subdural hematomas with recurrent hydrocephalus status post VP shunt  Mood disorder Continue usual Seroquel dose  Postoperative hypothyroidism Continue usual Synthroid dose    Family Communication: daughter updated telephonically 10/14 Status is: Inpatient  Remains inpatient appropriate because:Unsafe d/c plan  Dispo: The patient is from: SNF              Anticipated d/c is to: SNF, will hear more info from [facility] healthcare tomorrow              Patient currently is medically stable to d/c.   Difficult to place patient Yes  Objective: Blood pressure 126/74, pulse 83, temperature 99.4 F (37.4 C), temperature source Oral, resp. rate 18, height 5\' 6"  (1.676 m), weight 59.5 kg, SpO2 98 %.  Intake/Output Summary (Last 24 hours) at 01/12/2021 1403 Last data filed at 01/11/2021 2123 Gross per 24 hour  Intake 710 ml  Output --  Net 710 ml   Filed Weights   01/07/21 0500 01/10/21 0500 01/11/21 0500  Weight: 58.1 kg 59.6 kg 59.5 kg    Examination: General: No acute respiratory distress -resting comfortably in bed.  Lungs: few scattered rhonchi Cardiac: rrr, no murmur Abdomen: feeding tube in place c/d/I, soft Ext: no edema  CBC: Recent Labs  Lab 01/08/21 0444 01/11/21 0512  WBC 4.4 4.0  HGB 10.9* 10.1*  HCT 34.9* 32.3*  MCV 82.3 83.2  PLT 136* 160*   Basic Metabolic Panel: Recent Labs  Lab 01/08/21 0444 01/11/21 0512  NA 139 141  K 4.0 4.3  CL 101 102  CO2 32 32  GLUCOSE 88 154*  BUN 21 23  CREATININE 0.48 0.52  CALCIUM 9.3 9.4   GFR: Estimated Creatinine Clearance: 67.4 mL/min (by C-G formula based on SCr of 0.52 mg/dL).   HbA1C: Hgb A1c MFr Bld   Date/Time Value Ref Range Status  12/14/2020 10:20 AM 6.2 (H) 4.8 - 5.6 % Final    Comment:    (NOTE) Pre diabetes:          5.7%-6.4%  Diabetes:              >6.4%  Glycemic control for   <7.0% adults with diabetes   04/22/2020 03:10 PM 5.8 (H) 4.8 - 5.6 % Final    Comment:    (NOTE) Pre diabetes:          5.7%-6.4%  Diabetes:              >6.4%  Glycemic control for   <7.0% adults with diabetes     CBG: Recent Labs  Lab 01/11/21 0531 01/11/21 0903 01/11/21 1222 01/11/21 1715 01/12/21 0508  GLUCAP 142* 83 94 92 85    Scheduled Meds:  Chlorhexidine Gluconate Cloth  6 each Topical Daily   enoxaparin (LOVENOX) injection  40 mg Subcutaneous Q24H   feeding supplement (OSMOLITE 1.2 CAL)  355 mL Per Tube 5 X Daily   free water  150 mL Per Tube Q6H   guaiFENesin  10 mL Per Tube Q8H   levETIRAcetam  750 mg Per Tube BID   levothyroxine  100 mcg Per Tube Q0600   polyethylene glycol  17 g Per Tube BID   QUEtiapine  50 mg Per Tube BID   senna  1 tablet Per Tube Daily     LOS: 29 days   Laurey Arrow, MD [facility]   If 7PM-7AM, please contact night-coverage per Amion 01/12/2021, 2:03 PM

## 2021-01-13 DIAGNOSIS — J9601 Acute respiratory failure with hypoxia: Secondary | ICD-10-CM | POA: Diagnosis not present

## 2021-01-13 LAB — GLUCOSE, CAPILLARY: Glucose-Capillary: 92 mg/dL (ref 70–99)

## 2021-01-13 NOTE — Progress Notes (Signed)
Per respiratory therapy patient is requiring suctioning about 3 times per day and it is important that her inner cannula be changed every shift because it can become occluded.

## 2021-01-13 NOTE — TOC Transition Note (Signed)
Transition of Care [facility]) - CM/SW Discharge Note   Patient Details  Name: Stacy Moran MRN: [id number] Date of Birth: [birth year]  [facility] CM/SW Contact:  [name], RN Phone Number: 01/13/2021, 3:18 PM   Clinical Narrative:    [facility] does not feel that they can meet the patient's needs.  Daughter updated that patient will not be returning to [facility].  Daughter does report that a LTC Medicaid application has been started.       Barriers to Discharge: No SNF bed   Patient Goals and CMS Choice Patient states their goals for this hospitalization and ongoing recovery are:: daughter does not want patient to go back to [facility] CMS Medicare.gov Compare Post Acute Care list provided to:: Patient Represenative (must comment) Choice offered to / list presented to : Adult Children  Discharge Placement                       Discharge Plan and Services   Discharge Planning Services: CM Consult Post Acute Care Choice: [place]          DME Arranged: N/A DME Agency: NA       HH Arranged: NA HH Agency: NA        Social Determinants of Health (SDOH) Interventions     Readmission Risk Interventions Readmission Risk Prevention Plan 12/31/2020  Transportation Screening Complete  Medication Review Press photographer) Complete  PCP or Specialist appointment within 3-5 days of discharge Complete  HRI or Home Care Consult Complete  SW Recovery Care/Counseling Consult Complete  [place] Complete

## 2021-01-13 NOTE — TOC Progression Note (Signed)
Transition of Care [facility]) - Progression Note    Patient Details  Name: Dortha Neighbors MRN: [id number] Date of Birth: 04-28-1955  Transition of Care [facility]) CM/SW Contact  [name], RN Phone Number: 01/13/2021, 12:22 PM  Clinical Narrative:    Message left for [name], admissions coordinator at [facility] to see if they can accept patient back today.  Per respiratory patient is requiring suction 3 times per day and inner cannula change once every 12 hours.    Expected Discharge Plan: [place] Barriers to Discharge: No SNF bed  Expected Discharge Plan and Services Expected Discharge Plan: [place]   Discharge Planning Services: CM Consult Post Acute Care Choice: [place] Living arrangements for the past 2 months: [place]                 DME Arranged: N/A DME Agency: NA       HH Arranged: NA HH Agency: NA         Social Determinants of Health (SDOH) Interventions    Readmission Risk Interventions Readmission Risk Prevention Plan 12/31/2020  Transportation Screening Complete  Medication Review Press photographer) Complete  PCP or Specialist appointment within 3-5 days of discharge Complete  HRI or Home Care Consult Complete  SW Recovery Care/Counseling Consult Complete  Palliative Care Screening Not [place] Complete

## 2021-01-13 NOTE — Progress Notes (Signed)
Stacy Moran  JGG:[id number] DOB: 1955-06-29 DOA: 12/14/2020 PCP: [name], MD    Brief Narrative:  [phone] with a history of ruptured intracranial aneurysm and subsequent SHD/SAH, bilateral true vocal cord paralysis, chronic tracheostomy and PEG, HTN, epilepsy, hypothyroidism, and VP shunt for recurrent hydrocephalus who presented from her SNF due to respiratory distress.  Evidently patient's tracheostomy tube became dislodged, and she was in severe respiratory distress.     On arrival, she was emergently taken to the OR, intubated, and her tracheostomy tube was replaced by ENT, Dr. Pryor Ochoa after dilation of the existing fistula.  She has subsequently stabilized.  Has been ready for discharge to skilled nursing facility for many days now,  however no bed has been available.  Disposition: Awaiting SNF placement.   Consultants:  ENT  Code Status: FULL CODE  Antimicrobials:  None presently  DVT prophylaxis: Lovenox resumed 10/10  Subjective: Afebrile.  Vital signs stable.  No acute events reported overnight.  Assessment & Plan:  Acute hypoxic respiratory failure - displaced chronic tracheostomy - chronic bilateral vocal cord dysfunction ENT emergently took the patient to the OR for replacement of her chronic tracheostomy at the time of presentation -has been educated on care of new trach by RT -stable from a respiratory standpoint -follow-up with Dr. Pryor Ochoa from ENT in 1 month. Cuffed trach changed to uncuffed trach on 10/13 as no SNF will apparently accept patient with cuffed trach. Dr. Pryor Ochoa performed that. He advises that if it becomes dislodged, that it should be replaced w/ deflated cuffed tube to help prevent accidental removal.  Dysphagia due to bilateral vocal cord dysfunction Continue tube feeding per bolus protocol - has been evaluated by RD  DM2 with hypoglycemia Appears to be very sensitive to insulin - hypoglycemia resolved with discontinuation of SSI - A1c 6.2  - CBGs controlled   Essential HTN BP is stable  Thrombocytopenia Lovenox was initially held when platelet count dropped below 100,000 - Plt now stable > 100. Hiv neg 2021. Hcv neg here, smear unremarkable - monitor  Seizure disorder Well-controlled  -continue Keppra  History of subarachnoid and subdural hematomas with recurrent hydrocephalus status post VP shunt  Mood disorder Continue usual Seroquel dose  Postoperative hypothyroidism Continue usual Synthroid dose    Family Communication: daughter updated telephonically 10/14. No answer when called today  Status is: Inpatient  Remains inpatient appropriate because:Unsafe d/c plan  Dispo: The patient is from: SNF              Anticipated d/c is to: SNF, today waiting to hear back from [facility] healthcare              Patient currently is medically stable to d/c.   Difficult to place patient Yes  Objective: Blood pressure (!) 124/93, pulse 82, temperature 98.7 F (37.1 C), temperature source Oral, resp. rate 18, height 5\' 6"  (1.676 m), weight 59.5 kg, SpO2 99 %. No intake or output data in the 24 hours ending 01/13/21 1337  Filed Weights   01/07/21 0500 01/10/21 0500 01/11/21 0500  Weight: 58.1 kg 59.6 kg 59.5 kg    Examination: General: No acute respiratory distress -resting comfortably in bed.  Lungs: few scattered rhonchi Cardiac: rrr, no murmur Abdomen: feeding tube in place c/d/I, soft Ext: no edema  CBC: Recent Labs  Lab 01/08/21 0444 01/11/21 0512  WBC 4.4 4.0  HGB 10.9* 10.1*  HCT 34.9* 32.3*  MCV 82.3 83.2  PLT 136* 035*   Basic Metabolic Panel:  Recent Labs  Lab 01/08/21 0444 01/11/21 0512  NA 139 141  K 4.0 4.3  CL 101 102  CO2 32 32  GLUCOSE 88 154*  BUN 21 23  CREATININE 0.48 0.52  CALCIUM 9.3 9.4   GFR: Estimated Creatinine Clearance: 67.4 mL/min (by C-G formula based on SCr of 0.52 mg/dL).   HbA1C: Hgb A1c MFr Bld  Date/Time Value Ref Range Status  12/14/2020 10:20 AM 6.2  (H) 4.8 - 5.6 % Final    Comment:    (NOTE) Pre diabetes:          5.7%-6.4%  Diabetes:              >6.4%  Glycemic control for   <7.0% adults with diabetes   04/22/2020 03:10 PM 5.8 (H) 4.8 - 5.6 % Final    Comment:    (NOTE) Pre diabetes:          5.7%-6.4%  Diabetes:              >6.4%  Glycemic control for   <7.0% adults with diabetes     CBG: Recent Labs  Lab 01/11/21 0903 01/11/21 1222 01/11/21 1715 01/12/21 0508 01/13/21 0517  GLUCAP 83 94 92 85 92    Scheduled Meds:  Chlorhexidine Gluconate Cloth  6 each Topical Daily   enoxaparin (LOVENOX) injection  40 mg Subcutaneous Q24H   feeding supplement (OSMOLITE 1.2 CAL)  355 mL Per Tube 5 X Daily   free water  150 mL Per Tube Q6H   guaiFENesin  10 mL Per Tube Q8H   levETIRAcetam  750 mg Per Tube BID   levothyroxine  100 mcg Per Tube Q0600   polyethylene glycol  17 g Per Tube BID   QUEtiapine  50 mg Per Tube BID   senna  1 tablet Per Tube Daily     LOS: 30 days   Laurey Arrow, MD [facility]   If 7PM-7AM, please contact night-coverage per Amion 01/13/2021, 1:37 PM

## 2021-01-14 DIAGNOSIS — J9601 Acute respiratory failure with hypoxia: Secondary | ICD-10-CM | POA: Diagnosis not present

## 2021-01-14 DIAGNOSIS — J3802 Paralysis of vocal cords and larynx, bilateral: Secondary | ICD-10-CM | POA: Diagnosis present

## 2021-01-14 LAB — CBC
HCT: 34 % — ABNORMAL LOW (ref 36.0–46.0)
Hemoglobin: 10.4 g/dL — ABNORMAL LOW (ref 12.0–15.0)
MCH: 25.6 pg — ABNORMAL LOW (ref 26.0–34.0)
MCHC: 30.6 g/dL (ref 30.0–36.0)
MCV: 83.5 fL (ref 80.0–100.0)
Platelets: 133 10*3/uL — ABNORMAL LOW (ref 150–400)
RBC: 4.07 MIL/uL (ref 3.87–5.11)
RDW: 14.6 % (ref 11.5–15.5)
WBC: 4 10*3/uL (ref 4.0–10.5)
nRBC: 0 % (ref 0.0–0.2)

## 2021-01-14 LAB — GLUCOSE, CAPILLARY
Glucose-Capillary: 87 mg/dL (ref 70–99)
Glucose-Capillary: 91 mg/dL (ref 70–99)

## 2021-01-14 NOTE — Progress Notes (Signed)
Nutrition Follow-up  DOCUMENTATION CODES:  Severe malnutrition in context of chronic illness  INTERVENTION:  Continue TF via PEG. 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 - ongoing  GOAL:  Patient will meet greater than or equal to 90% of their needs - being addressed with TF  MONITOR:  TF tolerance, Labs, Weight trends, Vent status, I & O's  REASON FOR ASSESSMENT:  Consult Enteral/tube feeding initiation and management  ASSESSMENT:  65 yo female with PMH of vocal cord paralysis with chronic tracheostomy, HTN, hx subarachnoid hemorrhage, chronic PEG tube, anxiety, and ventriculoperitoneal shunt for recurrent hydrocephalus presented to ED from [place] with respiratory distress. Patient was found in respiratory distress in her room with a tracheostomy tube on the floor.  Pt continues to be medically stable and CM working on placement. Difficult to find facility to accept due to trach. Weight stable since bolus feeds were increased to 5x/d. Most recent lab work WNL. Will Continue current nutrition plan.  Nutritionally Relevant Medications: Scheduled Meds:  feeding supplement (OSMOLITE 1.2 CAL)  355 mL Per Tube 5 X Daily   free water  150 mL Per Tube Q6H   levothyroxine  100 mcg Per Tube Q0600   polyethylene glycol  17 g Per Tube BID   senna  1 tablet Per Tube Daily   PRN Meds: docusate  Labs Reviewed  NUTRITION - FOCUSED PHYSICAL EXAM: Flowsheet Row Most Recent Value  Orbital Region Mild depletion  Upper Arm Region Severe depletion  Thoracic and Lumbar Region Moderate depletion  Buccal Region Moderate depletion  Temple Region  Moderate depletion  Clavicle Bone Region Moderate depletion  Clavicle and Acromion Bone Region Moderate depletion  Scapular Bone Region Mild depletion  Dorsal Hand Moderate depletion  Patellar Region Severe depletion  Anterior Thigh Region Severe depletion  Posterior Calf Region Severe depletion  Edema (RD Assessment) None  Hair Reviewed  Eyes Reviewed  Mouth Reviewed  Skin Reviewed  Nails Reviewed   Diet Order:   Diet Order             Diet NPO time specified  Diet effective now                  EDUCATION NEEDS:  Education needs have been addressed  Skin:  Skin Assessment: Reviewed RN Assessment  Last BM:  10/11  Height:  Ht Readings from Last 1 Encounters:  12/14/20 5\' 6"  (1.676 m)   Weight:  Wt Readings from Last 1 Encounters:  01/11/21 59.5 kg   Ideal Body Weight:  59.1 kg  BMI:  Body mass index is 21.17 kg/m.  Estimated Nutritional Needs:  Kcal:  1800-2000 kcal Protein:  90-100 g/d Fluid:  1700-1900 mL/d   Ranell Patrick, RD, LDN Clinical Dietitian RD pager # available in AMION  After hours/weekend pager # available in [facility]

## 2021-01-14 NOTE — Progress Notes (Signed)
PROGRESS NOTE    Kateryna Grantham   HFW:[id number]  DOB: 1956-03-26  PCP: [name], MD    DOA: 12/14/2020 LOS: 20    Brief Narrative / Hospital Course to Date:   65yo with a history of ruptured intracranial aneurysm and subsequent SHD/SAH, bilateral true vocal cord paralysis, chronic tracheostomy and PEG, HTN, epilepsy, hypothyroidism, and VP shunt for recurrent hydrocephalus who presented from her SNF due to respiratory distress after her tracheostomy tube apparently became dislodged.   On arrival, she was emergently taken to the OR, intubated, and her tracheostomy tube was replaced by ENT, Dr. Pryor Ochoa after dilation of the existing fistula.  She has subsequently stabilized.  Has been ready for discharge to skilled nursing facility for many days now,  however no bed has been available.   Assessment & Plan   Active Problems:   Acute respiratory failure (HCC)  Acute hypoxic respiratory failure secondary to displaced chronic tracheostomy Chronic bilateral vocal cord dysfunction/paralysis ENT emergently took the patient to the OR for replacement of her chronic tracheostomy at the time of presentation -has been educated on care of new trach by RT  Patient is stable from a respiratory standpoint  Follow-up with Dr. Pryor Ochoa from ENT in 1 month.  Cuffed trach changed to uncuffed trach on 10/13. Difficult placement as no SNF will apparently accept patient with cuffed trach.  Dr. Pryor Ochoa advises that if it becomes dislodged, that it should be replaced w/ deflated cuffed tube to help prevent accidental removal.   Dysphagia due to bilateral vocal cord dysfunction Continue tube feeding per bolus protocol  Patient was evaluated by RD   Type 2 diabetes with hypoglycemia Appears to be very sensitive to insulin. Hypoglycemia resolved with discontinuation of SSI  Hbg A1c 6.2%  CBGs controlled, monitor   Essential HTN -blood pressures controlled monitor    Thrombocytopenia Lovenox was  initially held when platelet count dropped below 100,000. Platelet count now stable > 100.  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   DVT prophylaxis: enoxaparin (LOVENOX) injection 40 mg Start: 01/05/21 0930 Place and maintain sequential compression device Start: 12/22/20 0277   Diet:  Diet Orders (From admission, onward)     Start     Ordered   12/14/20 0426  Diet NPO time specified  Diet effective now        12/14/20 0426              Code Status: Full Code   Subjective 01/14/21    Patient seen this morning awake sitting up in bed.  She asks if she will be able to get discharged today.  Having some secretions but otherwise says she feels well.  States she is tired of not being relieved food by mouth.  No other acute complaints.   Disposition Plan ENT   Status is: Inpatient  Remains inpatient appropriate because: Awaiting SNF placement, difficult due to cuffed trach status     Consults, Procedures, Significant Events   Consultants:  ENT  Procedures:  Tracheostomy replacement 10/13  Antimicrobials:  Anti-infectives (From admission, onward)    None         Micro  Objective   Vitals:   01/14/21 0238 01/14/21 0335 01/14/21 0537 01/14/21 1210  BP:   119/84 132/79  Pulse:   70 72  Resp:   16 16  Temp:   98.6 F (37 C) 98.3 F (36.8 C)  TempSrc:   Oral Oral  SpO2: 100% 100% 100% 100%  Weight:      Height:       No intake or output data in the 24 hours ending 01/14/21 1403 Filed Weights   01/07/21 0500 01/10/21 0500 01/11/21 0500  Weight: 58.1 kg 59.6 kg 59.5 kg    Physical  Exam:  General exam: awake, alert, no acute distress HEENT: Trach collar at 5 L/min O2, using PMV to speak, moist mucus membranes, hearing grossly normal  Respiratory system: CTAB with intermittent upper airway secretion sounds, no wheezes, rales or rhonchi, normal respiratory effort. Cardiovascular system: normal S1/S2, RRR Gastrointestinal system: soft, NT, ND, bowel sounds present Central nervous system: A&O x3. no gross focal neurologic deficits, normal speech Extremities: moves all, no edema, normal tone Skin: dry, intact, normal temperature, normal color Psychiatry: normal mood, congruent affect, judgement and insight appear normal  Labs   Data Reviewed: I have personally reviewed following labs and imaging studies  CBC: Recent Labs  Lab 01/08/21 0444 01/11/21 0512 01/14/21 0416  WBC 4.4 4.0 4.0  HGB 10.9* 10.1* 10.4*  HCT 34.9* 32.3* 34.0*  MCV 82.3 83.2 83.5  PLT 136* 128* 096*   Basic Metabolic Panel: Recent Labs  Lab 01/08/21 0444 01/11/21 0512  NA 139 141  K 4.0 4.3  CL 101 102  CO2 32 32  GLUCOSE 88 154*  BUN 21 23  CREATININE 0.48 0.52  CALCIUM 9.3 9.4   GFR: Estimated Creatinine Clearance: 67.4 mL/min (by C-G formula based on SCr of 0.52 mg/dL). Liver Function Tests: No results for input(s): AST, ALT, ALKPHOS, BILITOT, PROT, ALBUMIN in the last 168 hours. No results for input(s): LIPASE, AMYLASE in the last 168 hours. No results for input(s): AMMONIA in the last 168 hours. Coagulation Profile: No results for input(s): INR, PROTIME in the last 168 hours. Cardiac Enzymes: No results for input(s): CKTOTAL, CKMB, CKMBINDEX, TROPONINI in the last 168 hours. BNP (last 3 results) No results for input(s): PROBNP in the last 8760 hours. HbA1C: No results for input(s): HGBA1C in the last 72 hours. CBG: Recent Labs  Lab 01/11/21 1222 01/11/21 1715 01/12/21 0508 01/13/21 0517 01/14/21 0535  GLUCAP 94 92 85 92 87   Lipid Profile: No results for  input(s): CHOL, HDL, LDLCALC, TRIG, CHOLHDL, LDLDIRECT in the last 72 hours. Thyroid Function Tests: No results for input(s): TSH, T4TOTAL, FREET4, T3FREE, THYROIDAB in the last 72 hours. Anemia Panel: No results for input(s): VITAMINB12, FOLATE, FERRITIN, TIBC, IRON, RETICCTPCT in the last 72 hours. Sepsis Labs: No results for input(s): PROCALCITON, LATICACIDVEN in the last 168 hours.  No results found for this or any previous visit (from the past 240 hour(s)).    Imaging Studies   No results found.   Medications   Scheduled Meds:  Chlorhexidine Gluconate Cloth  6 each Topical Daily   enoxaparin (LOVENOX) injection  40 mg Subcutaneous Q24H   feeding supplement (OSMOLITE 1.2 CAL)  355 mL Per Tube 5 X Daily   free water  150 mL Per Tube Q6H   guaiFENesin  10 mL Per Tube Q8H   levETIRAcetam  750 mg Per Tube BID   levothyroxine  100 mcg Per Tube Q0600   polyethylene glycol  17 g Per Tube BID   QUEtiapine  50 mg Per Tube BID   senna  1 tablet Per Tube Daily   Continuous Infusions:     LOS: 31 days    Time spent: 30 minutes    Ezekiel Slocumb, DO [facility]  01/14/2021, 2:03 PM      If 7PM-7AM, please contact night-coverage. How to contact the [facility] Attending or Consulting provider [place] or covering provider during after hours [place], for this patient?    Check the care team in [facility] and look for a) attending/consulting [facility] provider listed and b) the [facility] team listed Log into [email or web address] and use [place] universal password to access. If you do not have the password, please contact the hospital operator. Locate the [facility] provider you are looking for under [facility] and page to a number that you can be directly reached. If you still have difficulty reaching the provider, please page the [facility] (Director on Call) for the Hospitalists listed on amion for assistance.

## 2021-01-15 DIAGNOSIS — Z931 Gastrostomy status: Secondary | ICD-10-CM

## 2021-01-15 DIAGNOSIS — Z930 Tracheostomy status: Secondary | ICD-10-CM | POA: Diagnosis not present

## 2021-01-15 DIAGNOSIS — I10 Essential (primary) hypertension: Secondary | ICD-10-CM | POA: Diagnosis not present

## 2021-01-15 LAB — GLUCOSE, CAPILLARY: Glucose-Capillary: 95 mg/dL (ref 70–99)

## 2021-01-15 MED ORDER — AMLODIPINE BESYLATE 5 MG PO TABS
5.0000 mg | ORAL_TABLET | Freq: Every day | ORAL | Status: DC
  Administered 2021-01-15 – 2021-01-18 (×4): 5 mg via ORAL
  Filled 2021-01-15 (×4): qty 1

## 2021-01-15 NOTE — Progress Notes (Signed)
PROGRESS NOTE    Stacy Moran   IOE:[id number]  DOB: [birth year]  PCP: [name], MD    DOA: 12/14/2020 LOS: 73    Brief Narrative / Hospital Course to Date:   65yo with a history of ruptured intracranial aneurysm and subsequent SHD/SAH, bilateral true vocal cord paralysis, chronic tracheostomy and PEG, HTN, epilepsy, hypothyroidism, and VP shunt for recurrent hydrocephalus who presented from her SNF due to respiratory distress after her tracheostomy tube apparently became dislodged.   On arrival, she was emergently taken to the OR, intubated, and her tracheostomy tube was replaced by ENT, Dr. Pryor Ochoa after dilation of the existing fistula.  She has subsequently stabilized.  Has been ready for discharge to skilled nursing facility for many days now,  however no bed has been available.   Assessment & Plan   Active Problems:   Tracheostomy status ([place])   Acute respiratory failure (HCC)   PEG (percutaneous endoscopic gastrostomy) status ([place])   Essential hypertension   Bilateral vocal cord paralysis  Acute hypoxic respiratory failure secondary to displaced chronic tracheostomy Chronic bilateral vocal cord dysfunction/paralysis ENT emergently took the patient to the OR for replacement of her chronic tracheostomy at the time of presentation -has been educated on care of new trach by RT  Patient is stable from a respiratory standpoint  Follow-up with Dr. Pryor Ochoa from ENT in 1 month.  Cuffed trach changed to uncuffed trach on 10/13. Difficult placement as no SNF will apparently accept patient with cuffed trach.  Dr. Pryor Ochoa advises that if it becomes dislodged, that it should be replaced w/ deflated cuffed tube to help prevent accidental removal.   Dysphagia due to bilateral vocal cord dysfunction Continue tube feeding per bolus protocol  Patient was evaluated by RD 10/20: Patient may have small sips of clear liquid, never to be swallowed, suctioned from her oral cavity.  She  understands and accepts risk of aspiration or infection associated with this.   Type 2 diabetes with hypoglycemia Appears to be very sensitive to insulin. Hypoglycemia resolved with discontinuation of SSI  Hbg A1c 6.2%  CBGs controlled, monitor   Essential HTN -blood pressures have been controlled, more recently mildly elevated. Appears to take amlodipine 10 mg at home. --Start amlodipine 5 mg daily   Thrombocytopenia Lovenox was initially held when platelet count dropped below 100,000. Platelet count now stable > 100.  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   DVT prophylaxis: enoxaparin (LOVENOX) injection 40 mg Start: 01/05/21 0930 Place and maintain sequential compression device Start: 12/22/20 1829   Diet:  Diet Orders (From admission, onward)     Start     Ordered   01/15/21 1000  Diet clear liquid Room service appropriate? Yes; Fluid consistency: Thin  Diet effective now       Comments: Only juices or italian ice.  NOT TO SWALLOW.  Patient to take in by mouth and will suction to remove.  Question Answer Comment  Room service appropriate? Yes   Fluid consistency: Thin      01/15/21 1000              Code Status: Full Code   Subjective  01/15/21    Patient seen this morning awake sitting up in bed.  She reports poor sleep due to being woken for blood sugar checks and intermittently coughing.  Denies fevers or chills.  Secretions stable.  She wants more than anything to be able to eat or drink.  We discussed allowing some sips of clear liquid that she should never attempt to swallow but suction from her  mouth after having a taste.  We discussed the risks associated with this which she understands and is willing to accept, including risk of aspiration and infection.   Disposition Plan ENT   Status is: Inpatient  Remains inpatient appropriate because: Awaiting SNF placement, difficult placement due to cuffed trach status     Consults, Procedures, Significant Events   Consultants:  ENT  Procedures:  Tracheostomy replacement 10/13  Antimicrobials:  Anti-infectives (From admission, onward)    None         Micro    Objective   Vitals:   01/14/21 2051 01/15/21 0602 01/15/21 0749 01/15/21 1149  BP: (!) 137/95 (!) 162/95 (!) 149/96 135/90  Pulse: 69 74 85 73  Resp: 18 18 17 17   Temp: 98.6 F (37 C) 98.5 F (36.9 C) 99.3 F (37.4 C) 98.5 F (36.9 C)  TempSrc: Oral Oral  Oral  SpO2: 100% 100% 99% 100%  Weight:      Height:       No intake or output data in the 24 hours ending 01/15/21 1422 Filed Weights   01/07/21 0500 01/10/21 0500 01/11/21 0500  Weight: 58.1 kg 59.6 kg 59.5 kg    Physical Exam:  General exam: awake, alert, no acute distress HEENT: Trach collar at 5 L/min O2, using PMV to speak, moist mucus membranes, hearing grossly normal  Respiratory system: lungs overall clear, referred upper airway secretion sounds, no wheezes,  normal respiratory effort, trach collar with 5 L/min supplemental oxygen. Cardiovascular system: normal S1/S2, RRR Gastrointestinal system: soft, NT, ND, bowel sounds present, PEG tube in place Central nervous system: A&O x3. no gross focal neurologic deficits, normal speech Psychiatry: normal mood, congruent affect, judgement and insight appear normal  Labs   Data Reviewed: I have personally reviewed following labs and imaging studies  CBC: Recent Labs  Lab 01/11/21 0512 01/14/21 0416  WBC 4.0 4.0  HGB 10.1* 10.4*  HCT 32.3* 34.0*  MCV 83.2 83.5  PLT 128* 133*    Basic Metabolic Panel: Recent Labs  Lab  01/11/21 0512  NA 141  K 4.3  CL 102  CO2 32  GLUCOSE 154*  BUN 23  CREATININE 0.52  CALCIUM 9.4    GFR: Estimated Creatinine Clearance: 67.4 mL/min (by C-G formula based on SCr of 0.52 mg/dL). Liver Function Tests: No results for input(s): AST, ALT, ALKPHOS, BILITOT, PROT, ALBUMIN in the last 168 hours. No results for input(s): LIPASE, AMYLASE in the last 168 hours. No results for input(s): AMMONIA in the last 168 hours. Coagulation Profile: No results for input(s): INR, PROTIME in the last 168 hours. Cardiac Enzymes: No results for input(s): CKTOTAL, CKMB, CKMBINDEX, TROPONINI in the last 168 hours. BNP (last 3 results) No results for input(s): PROBNP in the last 8760 hours. HbA1C: No results for input(s): HGBA1C in the last 72 hours. CBG: Recent Labs  Lab 01/12/21 0508 01/13/21 0517 01/14/21 0535 01/14/21 2047 01/15/21 0522  GLUCAP 85 92 87 91 95    Lipid Profile: No results for input(s): CHOL, HDL, LDLCALC, TRIG, CHOLHDL, LDLDIRECT in the last 72  hours. Thyroid Function Tests: No results for input(s): TSH, T4TOTAL, FREET4, T3FREE, THYROIDAB in the last 72 hours. Anemia Panel: No results for input(s): VITAMINB12, FOLATE, FERRITIN, TIBC, IRON, RETICCTPCT in the last 72 hours. Sepsis Labs: No results for input(s): PROCALCITON, LATICACIDVEN in the last 168 hours.  No results found for this or any previous visit (from the past 240 hour(s)).    Imaging Studies   No results found.   Medications   Scheduled Meds:  amLODipine  5 mg Oral Daily   Chlorhexidine Gluconate Cloth  6 each Topical Daily   enoxaparin (LOVENOX) injection  40 mg Subcutaneous Q24H   feeding supplement (OSMOLITE 1.2 CAL)  355 mL Per Tube 5 X Daily   free water  150 mL Per Tube Q6H   guaiFENesin  10 mL Per Tube Q8H   levETIRAcetam  750 mg Per Tube BID   levothyroxine  100 mcg Per Tube Q0600   polyethylene glycol  17 g Per Tube BID   QUEtiapine  50 mg Per Tube BID   senna  1 tablet Per  Tube Daily   Continuous Infusions:     LOS: 32 days    Time spent: 25 minutes      Ezekiel Slocumb, DO [facility]  01/15/2021, 2:22 PM      If 7PM-7AM, please contact night-coverage. How to contact the [facility] Attending or Consulting provider [place] or covering provider during after hours [place], for this patient?    Check the care team in [facility] and look for a) attending/consulting TRH provider listed and b) the [facility] team listed Log into [email or web address] and use [place]'s universal password to access. If you do not have the password, please contact the hospital operator. Locate the [facility] provider you are looking for under [facility] and page to a number that you can be directly reached. If you still have difficulty reaching the provider, please page the [facility] (Director on Call) for the Hospitalists listed on amion for assistance.

## 2021-01-15 NOTE — Progress Notes (Deleted)
PROGRESS NOTE    Stacy Moran   SAY:[id number]  DOB: 02-Jan-1956  PCP: [name], MD    DOA: 12/14/2020 LOS: 61    Brief Narrative / Hospital Course to Date:   65yo with a history of ruptured intracranial aneurysm and subsequent SHD/SAH, bilateral true vocal cord paralysis, chronic tracheostomy and PEG, HTN, epilepsy, hypothyroidism, and VP shunt for recurrent hydrocephalus who presented from her SNF due to respiratory distress after her tracheostomy tube apparently became dislodged.   On arrival, she was emergently taken to the OR, intubated, and her tracheostomy tube was replaced by ENT, Dr. Pryor Ochoa after dilation of the existing fistula.  She has subsequently stabilized.  Has been ready for discharge to skilled nursing facility for many days now,  however no bed has been available.   Assessment & Plan   Active Problems:   Tracheostomy status ([place])   Acute respiratory failure (HCC)   PEG (percutaneous endoscopic gastrostomy) status ([place])   Essential hypertension   Bilateral vocal cord paralysis  Acute hypoxic respiratory failure secondary to displaced chronic tracheostomy Chronic bilateral vocal cord dysfunction/paralysis ENT emergently took the patient to the OR for replacement of her chronic tracheostomy at the time of presentation -has been educated on care of new trach by RT  Patient is stable from a respiratory standpoint  Follow-up with Dr. Pryor Ochoa from ENT in 1 month.  Cuffed trach changed to uncuffed trach on 10/13. Difficult placement as no SNF will apparently accept patient with cuffed trach.  Dr. Pryor Ochoa advises that if it becomes dislodged, that it should be replaced w/ deflated cuffed tube to help prevent accidental removal.   Dysphagia due to bilateral vocal cord dysfunction Continue tube feeding per bolus protocol  Patient was evaluated by RD 10/20: Patient may have small sips of clear liquid, never to be swallowed, suctioned from her oral cavity.  She  understands and accepts risk of aspiration or infection associated with this.   Type 2 diabetes with hypoglycemia Appears to be very sensitive to insulin. Hypoglycemia resolved with discontinuation of SSI  Hbg A1c 6.2%  CBGs controlled, monitor   Essential HTN -blood pressures controlled monitor    Thrombocytopenia Lovenox was initially held when platelet count dropped below 100,000. Platelet count now stable > 100.  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   DVT prophylaxis: enoxaparin (LOVENOX) injection 40 mg Start: 01/05/21 0930 Place and maintain sequential compression device Start: 12/22/20 2355   Diet:  Diet Orders (From admission, onward)     Start     Ordered   01/15/21 1000  Diet clear liquid Room service appropriate? Yes; Fluid consistency: Thin  Diet effective now       Comments: Only juices or italian ice.  NOT TO SWALLOW.  Patient to take in by mouth and will suction to remove.  Question Answer Comment  Room service appropriate? Yes   Fluid consistency: Thin      01/15/21 1000              Code Status: Full Code   Subjective 01/15/21    Patient seen this morning awake sitting up in bed.  She reports poor  sleep due to being woken for blood sugar checks and intermittently coughing.  Denies fevers or chills.  Secretions stable.  She wants more than anything to be able to eat or drink.  We discussed allowing some sips of clear liquid that she should never attempt to swallow but suction from her mouth after having a taste.  We discussed the risks associated with this which she understands and is willing  to accept, including risk of aspiration and infection.   Disposition Plan ENT   Status is: Inpatient  Remains inpatient appropriate because: Awaiting SNF placement, difficult placement due to cuffed trach status     Consults, Procedures, Significant Events   Consultants:  ENT  Procedures:  Tracheostomy replacement 10/13  Antimicrobials:  Anti-infectives (From admission, onward)    None         Micro    Objective   Vitals:   01/14/21 2051 01/15/21 0602 01/15/21 0749 01/15/21 1149  BP: (!) 137/95 (!) 162/95 (!) 149/96 135/90  Pulse: 69 74 85 73  Resp: 18 18 17 17   Temp: 98.6 F (37 C) 98.5 F (36.9 C) 99.3 F (37.4 C) 98.5 F (36.9 C)  TempSrc: Oral Oral  Oral  SpO2: 100% 100% 99% 100%  Weight:      Height:       No intake or output data in the 24 hours ending 01/15/21 1417 Filed Weights   01/07/21 0500 01/10/21 0500 01/11/21 0500  Weight: 58.1 kg 59.6 kg 59.5 kg    Physical Exam:  General exam: awake, alert, no acute distress HEENT: Trach collar at 5 L/min O2, using PMV to speak, moist mucus membranes, hearing grossly normal  Respiratory system: lungs overall clear, referred upper airway secretion sounds, no wheezes,  normal respiratory effort, trach collar with 5 L/min supplemental oxygen. Cardiovascular system: normal S1/S2, RRR Gastrointestinal system: soft, NT, ND, bowel sounds present, PEG tube in place Central nervous system: A&O x3. no gross focal neurologic deficits, normal speech Psychiatry: normal mood, congruent affect, judgement and insight appear normal  Labs   Data Reviewed: I have personally reviewed following labs and imaging studies  CBC: Recent Labs  Lab 01/11/21 0512 01/14/21 0416  WBC 4.0 4.0  HGB 10.1* 10.4*  HCT 32.3* 34.0*  MCV 83.2 83.5  PLT 128* 109*   Basic Metabolic Panel: Recent Labs  Lab 01/11/21 0512  NA 141  K 4.3  CL 102  CO2 32  GLUCOSE 154*  BUN 23  CREATININE 0.52  CALCIUM 9.4    GFR: Estimated Creatinine Clearance: 67.4 mL/min (by C-G formula based on SCr of 0.52 mg/dL). Liver Function Tests: No results for input(s): AST, ALT, ALKPHOS, BILITOT, PROT, ALBUMIN in the last 168 hours. No results for input(s): LIPASE, AMYLASE in the last 168 hours. No results for input(s): AMMONIA in the last 168 hours. Coagulation Profile: No results for input(s): INR, PROTIME in the last 168 hours. Cardiac Enzymes: No results for input(s): CKTOTAL, CKMB, CKMBINDEX, TROPONINI in the last 168 hours. BNP (last 3 results) No results for input(s): PROBNP in the last 8760 hours. HbA1C: No results for input(s): HGBA1C in the last 72 hours. CBG: Recent Labs  Lab 01/12/21 0508 01/13/21 0517 01/14/21 0535 01/14/21 2047 01/15/21 0522  GLUCAP 85 92 87 91 95   Lipid Profile: No results for input(s): CHOL, HDL, LDLCALC, TRIG, CHOLHDL, LDLDIRECT in the last 72 hours. Thyroid Function Tests: No results for input(s): TSH, T4TOTAL, FREET4, T3FREE, THYROIDAB in the last 72 hours. Anemia Panel:  No results for input(s): VITAMINB12, FOLATE, FERRITIN, TIBC, IRON, RETICCTPCT in the last 72 hours. Sepsis Labs: No results for input(s): PROCALCITON, LATICACIDVEN in the last 168 hours.  No results found for this or any previous visit (from the past 240 hour(s)).    Imaging Studies   No results found.   Medications   Scheduled Meds:  amLODipine  5 mg Oral Daily   Chlorhexidine Gluconate Cloth  6 each Topical Daily   enoxaparin (LOVENOX) injection  40 mg Subcutaneous Q24H   feeding supplement (OSMOLITE 1.2 CAL)  355 mL Per Tube 5 X Daily   free water  150 mL Per Tube Q6H   guaiFENesin  10 mL Per Tube Q8H   levETIRAcetam  750 mg Per Tube BID   levothyroxine  100 mcg Per Tube Q0600   polyethylene glycol  17 g Per Tube BID   QUEtiapine  50 mg Per Tube BID   senna  1 tablet Per Tube Daily   Continuous Infusions:     LOS: 32 days    Time spent: 25 minutes      Ezekiel Slocumb, DO [facility]  01/15/2021, 2:17 PM      If 7PM-7AM, please contact night-coverage. How to contact the [facility] Attending or Consulting provider [place] or covering provider during after hours [place], for this patient?    Check the care team in [facility] and look for a) attending/consulting TRH provider listed and b) the [facility] team listed Log into [email or web address] and use [place]'s universal password to access. If you do not have the password, please contact the hospital operator. Locate the [facility] provider you are looking for under [facility] and page to a number that you can be directly reached. If you still have difficulty reaching the provider, please page the [facility] (Director on Call) for the Hospitalists listed on amion for assistance.

## 2021-01-16 DIAGNOSIS — Z930 Tracheostomy status: Secondary | ICD-10-CM | POA: Diagnosis not present

## 2021-01-16 DIAGNOSIS — Z931 Gastrostomy status: Secondary | ICD-10-CM | POA: Diagnosis not present

## 2021-01-16 LAB — GLUCOSE, CAPILLARY: Glucose-Capillary: 84 mg/dL (ref 70–99)

## 2021-01-16 NOTE — Progress Notes (Signed)
PROGRESS NOTE    Stacy Moran   MWN:[id number]  DOB: 06-01-1955  PCP: [name], MD    DOA: 12/14/2020 LOS: 33    Brief Narrative / Hospital Course to Date:   65yo with a history of ruptured intracranial aneurysm and subsequent SHD/SAH, bilateral true vocal cord paralysis, chronic tracheostomy and PEG, HTN, epilepsy, hypothyroidism, and VP shunt for recurrent hydrocephalus who presented from her SNF due to respiratory distress after her tracheostomy tube apparently became dislodged.   On arrival, she was emergently taken to the OR, intubated, and her tracheostomy tube was replaced by ENT, Dr. Pryor Ochoa after dilation of the existing fistula.  She has subsequently stabilized.  Has been ready for discharge to skilled nursing facility for many days now,  however no bed has been available.   Assessment & Plan   Active Problems:   Tracheostomy status ([place])   Acute respiratory failure (HCC)   PEG (percutaneous endoscopic gastrostomy) status ([place])   Essential hypertension   Bilateral vocal cord paralysis  Acute hypoxic respiratory failure secondary to displaced chronic tracheostomy Chronic bilateral vocal cord dysfunction/paralysis ENT emergently took the patient to the OR for replacement of her chronic tracheostomy at the time of presentation -has been educated on care of new trach by RT  Patient is stable from a respiratory standpoint  Follow-up with Dr. Pryor Ochoa from ENT in 1 month.  Cuffed trach changed to uncuffed trach on 10/13. Difficult placement due to trach.  Dr. Pryor Ochoa advises that if it becomes dislodged, that it should be replaced w/ deflated cuffed tube to help prevent accidental removal.   Dysphagia due to bilateral vocal cord dysfunction Continue tube feeding per bolus protocol  Patient was evaluated by RD 10/20: Patient may have small sips of clear liquid, never to be swallowed, suctioned from her oral cavity.  She understands and accepts risk of aspiration or  infection associated with this.   Type 2 diabetes with hypoglycemia Appears to be very sensitive to insulin. Hypoglycemia resolved with discontinuation of SSI  Hbg A1c 6.2%  CBGs controlled, monitor   Essential HTN -blood pressures have been controlled, more recently mildly elevated. Appears to take amlodipine 10 mg at home. --Start amlodipine 5 mg daily   Thrombocytopenia Lovenox was initially held when platelet count dropped below 100,000. Platelet count now stable > 100.  HIV neg 2021 HCV neg here, smear unremarkable Monitor   Seizure disorder - Well-controlled  Continue Keppra   History of subarachnoid and subdural hematomas with recurrent hydrocephalus status post VP shunt -no acute issues   Mood disorder - stable from psych standpoint Continue Seroquel    Postoperative hypothyroidism Continue Synthroid   Patient BMI: Body mass index is 20.53 kg/m.   DVT prophylaxis: enoxaparin (LOVENOX) injection 40 mg Start: 01/05/21 0930 Place and maintain sequential compression device Start: 12/22/20 4034   Diet:  Diet Orders (From admission, onward)     Start     Ordered   01/15/21 1000  Diet clear liquid Room service appropriate? Yes; Fluid consistency: Thin  Diet effective now       Comments: Only juices or italian ice.  NOT TO SWALLOW.  Patient to take in by mouth and will suction to remove.  Question Answer Comment  Room service appropriate? Yes   Fluid consistency: Thin      01/15/21 1000              Code Status: Full Code   Subjective 01/16/21    Patient was sleeping  comfortably when seen this.  Acute events reported overnight.  No visible secretions or audible secretion sounds patient appears breathing very comfortably   Disposition Plan ENT   Status is: Inpatient  Remains inpatient appropriate because: Awaiting SNF placement, difficult placement due to cuffed trach status     Consults, Procedures, Significant Events   Consultants:   ENT  Procedures:  Tracheostomy replacement 10/13  Antimicrobials:  Anti-infectives (From admission, onward)    None         Micro    Objective   Vitals:   01/16/21 0530 01/16/21 0845 01/16/21 1227 01/16/21 1241  BP: 123/84 (!) 139/96 105/64 115/73  Pulse: 73 84 77 69  Resp: 18 14 16    Temp: 99 F (37.2 C) 98.5 F (36.9 C) 98.8 F (37.1 C) 99 F (37.2 C)  TempSrc: Oral Oral Oral Oral  SpO2: 98% 100% 100% 100%  Weight:      Height:        Intake/Output Summary (Last 24 hours) at 01/16/2021 1408 Last data filed at 01/16/2021 0853 Gross per 24 hour  Intake 970 ml  Output --  Net 970 ml   Filed Weights   01/10/21 0500 01/11/21 0500 01/16/21 0500  Weight: 59.6 kg 59.5 kg 57.7 kg    Physical Exam:  General exam: sleeping comfortably, no acute distress Respiratory system: normal respiratory effort, Trach collar at 5 L/min O2 Cardiovascular system: normal S1/S2, RRR Extremities: normal tone, no edema GI: soft non-distended abdomen  Labs   Data Reviewed: I have personally reviewed following labs and imaging studies  CBC: Recent Labs  Lab 01/11/21 0512 01/14/21 0416  WBC 4.0 4.0  HGB 10.1* 10.4*  HCT 32.3* 34.0*  MCV 83.2 83.5  PLT 128* 195*   Basic Metabolic Panel: Recent Labs  Lab 01/11/21 0512  NA 141  K 4.3  CL 102  CO2 32  GLUCOSE 154*  BUN 23  CREATININE 0.52  CALCIUM 9.4   GFR: Estimated Creatinine Clearance: 65.6 mL/min (by C-G formula based on SCr of 0.52 mg/dL). Liver Function Tests: No results for input(s): AST, ALT, ALKPHOS, BILITOT, PROT, ALBUMIN in the last 168 hours. No results for input(s): LIPASE, AMYLASE in the last 168 hours. No results for input(s): AMMONIA in the last 168 hours. Coagulation Profile: No results for input(s): INR, PROTIME in the last 168 hours. Cardiac Enzymes: No results for input(s): CKTOTAL, CKMB, CKMBINDEX, TROPONINI in the last 168 hours. BNP (last 3 results) No results for input(s): PROBNP in  the last 8760 hours. HbA1C: No results for input(s): HGBA1C in the last 72 hours. CBG: Recent Labs  Lab 01/13/21 0517 01/14/21 0535 01/14/21 2047 01/15/21 0522 01/16/21 0529  GLUCAP 92 87 91 95 84   Lipid Profile: No results for input(s): CHOL, HDL, LDLCALC, TRIG, CHOLHDL, LDLDIRECT in the last 72 hours. Thyroid Function Tests: No results for input(s): TSH, T4TOTAL, FREET4, T3FREE, THYROIDAB in the last 72 hours. Anemia Panel: No results for input(s): VITAMINB12, FOLATE, FERRITIN, TIBC, IRON, RETICCTPCT in the last 72 hours. Sepsis Labs: No results for input(s): PROCALCITON, LATICACIDVEN in the last 168 hours.  No results found for this or any previous visit (from the past 240 hour(s)).    Imaging Studies   No results found.   Medications   Scheduled Meds:  amLODipine  5 mg Oral Daily   Chlorhexidine Gluconate Cloth  6 each Topical Daily   enoxaparin (LOVENOX) injection  40 mg Subcutaneous Q24H   feeding supplement (OSMOLITE 1.2 CAL)  355 mL Per Tube 5 X Daily   free water  150 mL Per Tube Q6H   guaiFENesin  10 mL Per Tube Q8H   levETIRAcetam  750 mg Per Tube BID   levothyroxine  100 mcg Per Tube Q0600   polyethylene glycol  17 g Per Tube BID   QUEtiapine  50 mg Per Tube BID   senna  1 tablet Per Tube Daily   Continuous Infusions:     LOS: 33 days    Time spent: 20 minutes      Ezekiel Slocumb, DO [facility]  01/16/2021, 2:08 PM      If 7PM-7AM, please contact night-coverage. How to contact the [facility] Attending or Consulting provider [place] or covering provider during after hours [place], for this patient?    Check the care team in [facility] and look for a) attending/consulting TRH provider listed and b) the [facility] team listed Log into [email or web address] and use [place]'s universal password to access. If you do not have the password, please contact the hospital operator. Locate the [facility] provider you are looking for under [facility] and page to  a number that you can be directly reached. If you still have difficulty reaching the provider, please page the [facility] (Director on Call) for the Hospitalists listed on amion for assistance.

## 2021-01-17 DIAGNOSIS — Z931 Gastrostomy status: Secondary | ICD-10-CM | POA: Diagnosis not present

## 2021-01-17 DIAGNOSIS — Z930 Tracheostomy status: Secondary | ICD-10-CM | POA: Diagnosis not present

## 2021-01-17 LAB — BASIC METABOLIC PANEL
Anion gap: 6 (ref 5–15)
BUN: 23 mg/dL (ref 8–23)
CO2: 33 mmol/L — ABNORMAL HIGH (ref 22–32)
Calcium: 9.4 mg/dL (ref 8.9–10.3)
Chloride: 102 mmol/L (ref 98–111)
Creatinine, Ser: 0.62 mg/dL (ref 0.44–1.00)
GFR, Estimated: >60 mL/min (ref >60)
Glucose, Bld: 90 mg/dL (ref 70–99)
Potassium: 4.1 mmol/L (ref 3.5–5.1)
Sodium: 141 mmol/L (ref 135–145)

## 2021-01-17 LAB — CBC WITH DIFFERENTIAL/PLATELET
Abs Immature Granulocytes: 0.01 10*3/uL (ref 0.00–0.07)
Basophils Absolute: 0 10*3/uL (ref 0.0–0.1)
Basophils Relative: 0 %
Eosinophils Absolute: 0.1 10*3/uL (ref 0.0–0.5)
Eosinophils Relative: 3 %
HCT: 32 % — ABNORMAL LOW (ref 36.0–46.0)
Hemoglobin: 10.3 g/dL — ABNORMAL LOW (ref 12.0–15.0)
Immature Granulocytes: 0 %
Lymphocytes Relative: 35 %
Lymphs Abs: 1.4 10*3/uL (ref 0.7–4.0)
MCH: 26.9 pg (ref 26.0–34.0)
MCHC: 32.2 g/dL (ref 30.0–36.0)
MCV: 83.6 fL (ref 80.0–100.0)
Monocytes Absolute: 0.4 10*3/uL (ref 0.1–1.0)
Monocytes Relative: 10 %
Neutro Abs: 2.1 10*3/uL (ref 1.7–7.7)
Neutrophils Relative %: 52 %
Platelets: 141 10*3/uL — ABNORMAL LOW (ref 150–400)
RBC: 3.83 MIL/uL — ABNORMAL LOW (ref 3.87–5.11)
RDW: 14.5 % (ref 11.5–15.5)
WBC: 4 10*3/uL (ref 4.0–10.5)
nRBC: 0 % (ref 0.0–0.2)

## 2021-01-17 LAB — GLUCOSE, CAPILLARY: Glucose-Capillary: 99 mg/dL (ref 70–99)

## 2021-01-17 NOTE — Progress Notes (Signed)
RT called by nursing staff multiple times this shift to suction the patient. Suctioned multiple times with multiple passes for small to moderate thin white/clear sputum. I have changed the inner cannula twice this shift. No debris/sputum lodged in inner cannula. Mostly clear cannula. SpO2 98% on 28% aerosol trach collar. Patient seems to be slightly anxious and not able to sleep through the night. Will continue to monitor.

## 2021-01-17 NOTE — Progress Notes (Signed)
PROGRESS NOTE    Stacy Moran   GYF:[id number]  DOB: [birth year]  PCP: [name], MD    DOA: 12/14/2020 LOS: 81    Brief Narrative / Hospital Course to Date:   65yo with a history of ruptured intracranial aneurysm and subsequent SHD/SAH, bilateral true vocal cord paralysis, chronic tracheostomy and PEG, HTN, epilepsy, hypothyroidism, and VP shunt for recurrent hydrocephalus who presented from her SNF due to respiratory distress after her tracheostomy tube apparently became dislodged.   On arrival, she was emergently taken to the OR, intubated, and her tracheostomy tube was replaced by ENT, Dr. Pryor Ochoa after dilation of the existing fistula.  She has subsequently stabilized.  Has been ready for discharge to skilled nursing facility for many days now,  however no bed has been available.   Assessment & Plan   Active Problems:   Tracheostomy status ([place])   Acute respiratory failure (HCC)   PEG (percutaneous endoscopic gastrostomy) status ([place])   Essential hypertension   Bilateral vocal cord paralysis  Acute hypoxic respiratory failure secondary to displaced chronic tracheostomy Chronic bilateral vocal cord dysfunction/paralysis ENT emergently took the patient to the OR for replacement of her chronic tracheostomy at the time of presentation -has been educated on care of new trach by RT  Patient is stable from a respiratory standpoint  Follow-up with Dr. Pryor Ochoa from ENT in 1 month.  Cuffed trach changed to uncuffed trach on 10/13. Difficult placement due to trach.  Dr. Pryor Ochoa advises that if it becomes dislodged, that it should be replaced w/ deflated cuffed tube to help prevent accidental removal.   Dysphagia due to bilateral vocal cord dysfunction Continue tube feeding per bolus protocol  Patient was evaluated by RD. SLP evaluated 10/22 - significant cough with trials of single ice chips --NPO --Can consider "pleasure" ice chips with nursing supervision, but risk of  respiratory decompensation is high   Type 2 diabetes with hypoglycemia Appears to be very sensitive to insulin. Hypoglycemia resolved with discontinuation of SSI  Hbg A1c 6.2%  CBGs controlled, monitor   Essential HTN -blood pressures have been controlled, more recently mildly elevated. Appears to take amlodipine 10 mg at home. --Start amlodipine 5 mg daily   Thrombocytopenia Lovenox was initially held when platelet count dropped below 100,000. Platelet count now stable > 100.  HIV neg 2021 HCV neg here, smear unremarkable Monitor   Seizure disorder - Well-controlled  Continue Keppra   History of subarachnoid and subdural hematomas with recurrent hydrocephalus status post VP shunt -no acute issues   Mood disorder - stable from psych standpoint Continue Seroquel    Postoperative hypothyroidism Continue Synthroid   Patient BMI: Body mass index is 21.21 kg/m.   DVT prophylaxis: enoxaparin (LOVENOX) injection 40 mg Start: 01/05/21 0930 Place and maintain sequential compression device Start: 12/22/20 9163   Diet:  Diet Orders (From admission, onward)     Start     Ordered   01/17/21 0754  Diet NPO time specified  Diet effective now        01/17/21 0753              Code Status: Full Code   Subjective 01/17/21    Patient reportedly had increased amount of secretions requiring more frequent suction overnight per RT.  Patient admits that she has been swallowing some of the liquids we had allowed her to try after discussing only suctioning them from her mouth.  She states secretions are not thick or concerning her.  She  denies fevers or chills, cough or shortness of breath or other acute complaints  Had SLP evaluate this morning, patient noted to have significant coughing with most trials of single ice chips.  Again made n.p.o.  For safety given high risk of respiratory decompensation.    Disposition Plan ENT   Status is: Inpatient  Remains inpatient appropriate  because: Awaiting SNF placement, difficult placement due to cuffed trach status     Consults, Procedures, Significant Events   Consultants:  ENT  Procedures:  Tracheostomy replacement 10/13  Antimicrobials:  Anti-infectives (From admission, onward)    None         Micro    Objective   Vitals:   01/17/21 0027 01/17/21 0411 01/17/21 0418 01/17/21 0722  BP: 124/90 118/80  132/79  Pulse: 84 67  78  Resp: 20 16  14   Temp: 98.7 F (37.1 C) 97.8 F (36.6 C)  99 F (37.2 C)  TempSrc: Oral Axillary  Oral  SpO2: 100% 97%  99%  Weight:   59.6 kg   Height:        Intake/Output Summary (Last 24 hours) at 01/17/2021 1301 Last data filed at 01/17/2021 8469 Gross per 24 hour  Intake 845 ml  Output --  Net 845 ml   Filed Weights   01/11/21 0500 01/16/21 0500 01/17/21 0418  Weight: 59.5 kg 57.7 kg 59.6 kg    Physical Exam:  General exam: Awake and alert, sitting up in bed, no acute distress HEENT: Trach with thick appearing secretions, hearing grossly normal Respiratory system: Referred upper airway secretion sounds otherwise generally lungs are clear, normal respiratory effort, Trach collar at 5 L/min O2 Cardiovascular system: normal S1/S2, RRR GI: PEG tube in place, soft non-distended abdomen Neurologic: Grossly nonfocal exam, alert and oriented x3  Labs   Data Reviewed: I have personally reviewed following labs and imaging studies  CBC: Recent Labs  Lab 01/11/21 0512 01/14/21 0416 01/17/21 0340  WBC 4.0 4.0 4.0  NEUTROABS  --   --  2.1  HGB 10.1* 10.4* 10.3*  HCT 32.3* 34.0* 32.0*  MCV 83.2 83.5 83.6  PLT 128* 133* 629*   Basic Metabolic Panel: Recent Labs  Lab 01/11/21 0512 01/17/21 0340  NA 141 141  K 4.3 4.1  CL 102 102  CO2 32 33*  GLUCOSE 154* 90  BUN 23 23  CREATININE 0.52 0.62  CALCIUM 9.4 9.4   GFR: Estimated Creatinine Clearance: 67.4 mL/min (by C-G formula based on SCr of 0.62 mg/dL). Liver Function Tests: No results for  input(s): AST, ALT, ALKPHOS, BILITOT, PROT, ALBUMIN in the last 168 hours. No results for input(s): LIPASE, AMYLASE in the last 168 hours. No results for input(s): AMMONIA in the last 168 hours. Coagulation Profile: No results for input(s): INR, PROTIME in the last 168 hours. Cardiac Enzymes: No results for input(s): CKTOTAL, CKMB, CKMBINDEX, TROPONINI in the last 168 hours. BNP (last 3 results) No results for input(s): PROBNP in the last 8760 hours. HbA1C: No results for input(s): HGBA1C in the last 72 hours. CBG: Recent Labs  Lab 01/14/21 0535 01/14/21 2047 01/15/21 0522 01/16/21 0529 01/17/21 0415  GLUCAP 87 91 95 84 99   Lipid Profile: No results for input(s): CHOL, HDL, LDLCALC, TRIG, CHOLHDL, LDLDIRECT in the last 72 hours. Thyroid Function Tests: No results for input(s): TSH, T4TOTAL, FREET4, T3FREE, THYROIDAB in the last 72 hours. Anemia Panel: No results for input(s): VITAMINB12, FOLATE, FERRITIN, TIBC, IRON, RETICCTPCT in the last 72 hours. Sepsis Labs:  No results for input(s): PROCALCITON, LATICACIDVEN in the last 168 hours.  No results found for this or any previous visit (from the past 240 hour(s)).    Imaging Studies   No results found.   Medications   Scheduled Meds:  amLODipine  5 mg Oral Daily   Chlorhexidine Gluconate Cloth  6 each Topical Daily   enoxaparin (LOVENOX) injection  40 mg Subcutaneous Q24H   feeding supplement (OSMOLITE 1.2 CAL)  355 mL Per Tube 5 X Daily   free water  150 mL Per Tube Q6H   guaiFENesin  10 mL Per Tube Q8H   levETIRAcetam  750 mg Per Tube BID   levothyroxine  100 mcg Per Tube Q0600   polyethylene glycol  17 g Per Tube BID   QUEtiapine  50 mg Per Tube BID   senna  1 tablet Per Tube Daily   Continuous Infusions:     LOS: 34 days    Time spent: 25 minutes with greater than 50% spent at bedside and in coordination of care     Ezekiel Slocumb, DO [facility]  01/17/2021, 1:01 PM      If 7PM-7AM,  please contact night-coverage. How to contact the [facility] Attending or Consulting provider [place] or covering provider during after hours [place], for this patient?    Check the care team in [facility] and look for a) attending/consulting TRH provider listed and b) the [facility] team listed Log into [email or web address] and use [place]'s universal password to access. If you do not have the password, please contact the hospital operator. Locate the [facility] provider you are looking for under [facility] and page to a number that you can be directly reached. If you still have difficulty reaching the provider, please page the [facility] (Director on Call) for the Hospitalists listed on amion for assistance.

## 2021-01-17 NOTE — Evaluation (Signed)
Clinical/Bedside Swallow Evaluation Patient Details  Name: Stacy Moran MRN: [id number] Date of Birth: 1955-06-06  Today's Date: 01/17/2021 Time: SLP Start Time (ACUTE ONLY): 46 SLP Stop Time (ACUTE ONLY): 1055 SLP Time Calculation (min) (ACUTE ONLY): 50 min  Past Medical History:  Past Medical History:  Diagnosis Date   Acute respiratory failure ([place])    Hypertension    Prosthetic eye globe    Ruptured aneurysm of artery (HCC)    Seizures ([place])    Status post insertion of percutaneous endoscopic gastrostomy (PEG) tube ([place])    Subdural hematoma ([place])    Tracheostomy dependent ([place])    Tracheostomy present [facility])    Past Surgical History:  Past Surgical History:  Procedure Laterality Date   BURR HOLE N/A 03/18/2020   Procedure: Haskell Flirt;  Surgeon: [name], MD;  Location: [place];  Service: Neurosurgery;  Laterality: N/A;   CRANIOTOMY Left 01/30/2020   Procedure: LEFT FAR LATERAL CRANIOTOMY FOR ANEURYSM CLIPPING;  Surgeon: [name], MD;  Location: [place];  Service: Neurosurgery;  Laterality: Left;   DIRECT LARYNGOSCOPY N/A 02/29/2020   Procedure: DIRECT LARYNGOSCOPY;  Surgeon: [name], MD;  Location: [place];  Service: ENT;  Laterality: N/A;   DIRECT LARYNGOSCOPY N/A 10/03/2020   Procedure: DIRECT LARYNGOSCOPY w/esophagascopy;  Surgeon: [name], MD;  Location: [place];  Service: ENT;  Laterality: N/A;   INTUBATION-ENDOTRACHEAL WITH TRACHEOSTOMY STANDBY N/A 12/14/2020   Procedure: Warrick Parisian WITH TRACHEOSTOMY STANDBY;  Surgeon: Carloyn Manner, MD;  Location: [facility];  Service: ENT;  Laterality: N/A;   IR ANGIO INTRA EXTRACRAN SEL INTERNAL CAROTID BILAT MOD SED  01/30/2020   IR ANGIO VERTEBRAL SEL VERTEBRAL UNI L MOD SED  01/30/2020   IR CM INJ ANY COLONIC TUBE W/FLUORO  07/03/2020   IR GASTROSTOMY TUBE MOD SED  02/22/2020   IR [place] GASTRO/COLONIC TUBE PERCUT W/FLUORO  07/03/2020   LAPAROSCOPIC REVISION VENTRICULAR-PERITONEAL (V-P) SHUNT N/A  03/18/2020   Procedure: LAPAROSCOPIC REVISION VENTRICULAR-PERITONEAL (V-P) SHUNT;  Surgeon: [name], MD;  Location: [place];  Service: General;  Laterality: N/A;   RADIOLOGY WITH ANESTHESIA N/A 01/30/2020   Procedure: IR WITH ANESTHESIA;  Surgeon: [name], MD;  Location: [place];  Service: Radiology;  Laterality: N/A;   THYROIDECTOMY N/A 02/08/2020   Procedure: THYROIDECTOMY;  Surgeon: [name], MD;  Location: [place];  Service: ENT;  Laterality: N/A;   TRACHEOSTOMY TUBE PLACEMENT N/A 02/08/2020   Procedure: TRACHEOSTOMY;  Surgeon: [name], MD;  Location: [place];  Service: ENT;  Laterality: N/A;   TRACHEOSTOMY TUBE PLACEMENT N/A 02/29/2020   Procedure: TRACHEOSTOMY EXCHANGE;  Surgeon: [name], MD;  Location: [place];  Service: ENT;  Laterality: N/A;   VENTRICULOPERITONEAL SHUNT N/A 03/18/2020   Procedure: RIGHT SHUNT INSERTION VENTRICULAR-PERITONEAL/ BURR HOLE Evacuation of Subdural Hematoma;  Surgeon: [name], MD;  Location: [place];  Service: Neurosurgery;  Laterality: N/A;   HPI:  Pt is a 65 y.o. 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 Patient had her trach tube exchanged in the ED 2 days ago after presenting with a completely occluded trach due to thick dry secretions. Unfortunately a 6 tracheostomy tube was unable to be replaced and patient received a 4 uncuffed trach tube instead. Was doing well until this evening.  Patient reports that she woke up and could not breathe.  She she was having coarse respirations.  The nursing home tried to suction her tracheostomy without significant relief.  Per EMS patient was initially hypoxic to the upper 80s.  Patient is not ventilator dependent and only use oxygen to sleep at night.  ENT consulted at admit: "Trans-nasal flexible laryngoscopy was performed in ER that showed pooling of secretions and bilateral TVF immobility in  median position.  Findings:  Patient intubated by anesthesia with McGraff laryngoscope and size 6 ETT with mild resistance through vocal folds.  After intubation, bubbling through tracheo-cutaneous fistula seen and size 4 cuffed shiley placed through fistula with moderate resistance and cuff inflated.".   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 DYSPHAGIA.  Pt has had Multiple MBSSs; the most recent: MBS 09/2020: severe pharyngoesophageal dysphagia with minimal improvements. Aspiration. Subglottic edema was decreased but UES function continued to be significantly impaired in addition to tongue base retraction and pharyngeal contraction all contributing to pharyngeal stasis. Esopahgeal phase deficits also.  Pt was recommended to remain NPO w/ PEG TFs; continued dysphagia tx at SNF then. Report of minimal Dysphagia tx since then.  CXR on 12/14/2020: Lower lung volumes with bibasilar atelectasis.    Assessment / Plan / Recommendation  Clinical Impression  Pt exhibits pharyngeal phase dysphagia and is at HIGH risk for aspiration thus Pulmonary decline; negative sequelae of aspiration. Pt has a h/o Dysphagia; Vocal Cord Paralysis w/ Chronic Tracheostomy. Pt has had Multiple MBSSs; the most recent: MBS 09/2020: severe pharyngoesophageal dysphagia with minimal improvements. Aspiration. Subglottic edema was decreased but UES function continued to be significantly impaired in addition to tongue base retraction and pharyngeal contraction all contributing to pharyngeal stasis. Esopahgeal phase deficits also.  Pt was recommended to remain NPO w/ PEG TFs; continued dysphagia tx at SNF then. Report of minimal Dysphagia tx since then.  W/ the overt s/s of aspiration  (coughing) consistently w/ trials of ice chips, ST recommendation is for NPO status w/ frequent oral care for hygiene and stimulation of swallowing. Pt is recommended to continue NPO w/ PEG TFs as her nutrition/hydration d/t her Risk for Aspiration documented per MBSSs. F/u w/ ST services at her SNF for further dysphagia tx in hopes of a potential repeat of MBSS in the future. Recommend f/u w/ Palliative Care for [place]. The above was discussed w/ MD, NSG. SLP Visit Diagnosis: Dysphagia, pharyngeal phase (R13.13) (chronic)    Aspiration Risk  Severe aspiration risk (PEG TFs baseline)    Diet Recommendation  NPO status; PEG TFs. Frequent oral care for hygiene and stimulation of swallowing  Medication Administration: Via alternative means (PEG)    Other  Recommendations Recommended Consults:  (Palliative Care) Oral Care Recommendations: Oral care QID;Patient independent with oral care Other Recommendations:  (n/a)    Recommendations for follow up therapy are one component of a multi-disciplinary discharge planning process, led by the attending physician.  Recommendations may be updated based on patient status, additional functional criteria and insurance authorization.  Follow up Recommendations None      Frequency and Duration  (n/a)   (n/a)       Prognosis Prognosis for Safe Diet Advancement: Guarded Barriers to Reach Goals: Time post onset;Severity of deficits Barriers/Prognosis Comment: chronic dysphagia; issues      Swallow Study   General Date of Onset: 12/08/20 HPI: Pt is a 65 y.o. female with a history of a ruptured aneurysm/subarachnoid hemorrhage  November/2021 tracheostomy dependent who presents for difficulty breathing. Patient had her trach tube exchanged in the ED 2 days ago after presenting with a completely occluded trach due to thick dry secretions. Unfortunately a 6 tracheostomy tube was unable to be replaced and patient received a 4 uncuffed trach tube instead. Was doing  well until this evening.  Patient reports that she woke up and could not breathe.  She she was having coarse respirations.  The nursing home tried to suction her tracheostomy without significant relief.  Per EMS patient was initially hypoxic to the upper 80s.  Patient is not ventilator dependent and only use oxygen to sleep at night.  ENT consulted at admit: "Trans-nasal flexible laryngoscopy was performed in ER that showed pooling of secretions and bilateral TVF immobility in median position.  Findings:  Patient intubated by anesthesia with McGraff laryngoscope and size 6 ETT with mild resistance through vocal folds.  After intubation, bubbling through tracheo-cutaneous fistula seen and size 4 cuffed shiley placed through fistula with moderate resistance and cuff inflated.".   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 DYSPHAGIA.  Pt has had Multiple MBSSs; the most recent: MBS 09/2020: severe pharyngoesophageal dysphagia with minimal improvements. Aspiration. Subglottic edema was decreased but UES function continued to be significantly impaired in addition to tongue base retraction and pharyngeal contraction all contributing to pharyngeal stasis. Esopahgeal phase deficits also.  Pt was recommended to remain NPO w/ PEG TFs; continued dysphagia tx at SNF then. Report of minimal Dysphagia tx since then.  CXR on 12/14/2020: Lower lung volumes with bibasilar atelectasis. Type of Study: Bedside Swallow Evaluation Previous Swallow Assessment: previous multiple MBSSs - see chart Diet Prior to this Study: NPO;PEG tube Temperature Spikes Noted: No Respiratory Status: Trach Collar (5L; dysphonia; PMV in place baseline) History of Recent Intubation:  No Behavior/Cognition: Alert;Cooperative;Pleasant mood Oral Cavity Assessment: Excessive secretions Oral Care Completed by SLP: Yes Oral Cavity - Dentition: Adequate natural dentition;Missing dentition (few) Vision: Functional for self-feeding Self-Feeding Abilities: Able to feed self Patient Positioning: Upright in bed Baseline Vocal Quality:  (dysphonic) Volitional Cough: Congested (reduced effort) Volitional Swallow: Able to elicit    Oral/Motor/Sensory Function Overall Oral Motor/Sensory Function: Within functional limits   Ice Chips Ice chips: Impaired Presentation: Spoon;Self Fed (supported; 6 trials) Pharyngeal Phase Impairments: Cough - Immediate;Cough - Delayed (5/6 trials)   Thin Liquid Thin Liquid: Not tested    Nectar Thick Nectar Thick Liquid: Not tested   Honey Thick Honey Thick Liquid: Not tested   Puree Puree: Not tested   Solid     Solid: Not tested         Orinda Kenner, MS, CCC-SLP Speech Language Pathologist Rehab Services [phone] Stacy Moran 01/17/2021,11:48 AM

## 2021-01-18 DIAGNOSIS — Z930 Tracheostomy status: Secondary | ICD-10-CM | POA: Diagnosis not present

## 2021-01-18 DIAGNOSIS — J3802 Paralysis of vocal cords and larynx, bilateral: Secondary | ICD-10-CM | POA: Diagnosis not present

## 2021-01-18 DIAGNOSIS — Z931 Gastrostomy status: Secondary | ICD-10-CM | POA: Diagnosis not present

## 2021-01-18 LAB — GLUCOSE, CAPILLARY
Glucose-Capillary: 85 mg/dL (ref 70–99)
Glucose-Capillary: 146 mg/dL — ABNORMAL HIGH (ref 70–99)
Glucose-Capillary: 86 mg/dL (ref 70–99)

## 2021-01-18 MED ORDER — AMLODIPINE BESYLATE 5 MG PO TABS
5.0000 mg | ORAL_TABLET | Freq: Every day | ORAL | Status: DC
  Administered 2021-01-19 – 2021-02-18 (×31): 5 mg
  Filled 2021-01-18 (×32): qty 1

## 2021-01-18 NOTE — Progress Notes (Signed)
PROGRESS NOTE    Stacy Moran   XBM:[id number]  DOB: Mar 16, 1956  PCP: [name], MD    DOA: 12/14/2020 LOS: 3    Brief Narrative / Hospital Course to Date:   65yo with a history of ruptured intracranial aneurysm and subsequent SHD/SAH, bilateral true vocal cord paralysis, chronic tracheostomy and PEG, HTN, epilepsy, hypothyroidism, and VP shunt for recurrent hydrocephalus who presented from her SNF due to respiratory distress after her tracheostomy tube apparently became dislodged.   On arrival, she was emergently taken to the OR, intubated, and her tracheostomy tube was replaced by ENT, Dr. Pryor Ochoa after dilation of the existing fistula.  She has subsequently stabilized.  Has been ready for discharge to skilled nursing facility for many days now,  however no bed has been available.   Assessment & Plan   Active Problems:   Tracheostomy status ([place])   Acute respiratory failure (HCC)   PEG (percutaneous endoscopic gastrostomy) status ([place])   Essential hypertension   Bilateral vocal cord paralysis  Acute hypoxic respiratory failure secondary to displaced chronic tracheostomy Chronic bilateral vocal cord dysfunction/paralysis ENT emergently took the patient to the OR for replacement of her chronic tracheostomy at the time of presentation -has been educated on care of new trach by RT  Patient is stable from a respiratory standpoint  Follow-up with Dr. Pryor Ochoa from ENT in 1 month.  Cuffed trach changed to uncuffed trach on 10/13. Difficult placement due to trach.  Dr. Pryor Ochoa advises that if it becomes dislodged, that it should be replaced w/ deflated cuffed tube to help prevent accidental removal.   Dysphagia due to bilateral vocal cord dysfunction Continue tube feeding per bolus protocol  Patient was evaluated by RD. SLP evaluated 10/22 - significant cough with trials of single ice chips --NPO   Type 2 diabetes with hypoglycemia Appears to be very sensitive to  insulin. Hypoglycemia resolved with discontinuation of SSI  Hbg A1c 6.2%  CBGs controlled, monitor   Essential HTN -blood pressures have been controlled, more recently mildly elevated. Appears to take amlodipine 10 mg at home. --Start amlodipine 5 mg daily   Thrombocytopenia Lovenox was initially held when platelet count dropped below 100,000. Platelet count now stable > 100.  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   DVT prophylaxis: enoxaparin (LOVENOX) injection 40 mg Start: 01/05/21 0930 Place and maintain sequential compression device Start: 12/22/20 0272   Diet:  Diet Orders (From admission, onward)     Start     Ordered   01/17/21 0754  Diet NPO time specified  Diet effective now        01/17/21 5366              Code Status: Full Code   Subjective 01/18/21    Patient ambulatory in the room getting some close out of her closet when seen this morning.  She reports needing some deep suctioning to be done.  Per nursing staff, overnight no issues with excessive secretions as there had been on that before.  Patient was made n.p.o. again yesterday after SLP evaluation.  Discussed with patient high risk of respiratory decompensation and probable need for mechanical  ventilation if we allow p.o. intake.  She expresses understanding and agreement.  Disposition Plan ENT   Status is: Inpatient  Remains inpatient appropriate because: Awaiting SNF placement, difficult placement due to cuffed trach status     Consults, Procedures, Significant Events   Consultants:  ENT  Procedures:  Tracheostomy replacement  10/13  Antimicrobials:  Anti-infectives (From admission, onward)    None         Micro    Objective   Vitals:   01/18/21 0347 01/18/21 0442 01/18/21 0722 01/18/21 1224  BP: 113/79  119/76 130/84  Pulse: 64  68 77  Resp: 20  16 16   Temp: 98.1 F (36.7 C)  97.8 F (36.6 C) 98.4 F (36.9 C)  TempSrc: Oral   Oral  SpO2: 100%  100% 100%  Weight:  59.8 kg    Height:        Intake/Output Summary (Last 24 hours) at 01/18/2021 1356 Last data filed at 01/18/2021 0925 Gross per 24 hour  Intake 1155 ml  Output --  Net 1155 ml   Filed Weights   01/16/21 0500 01/17/21 0418 01/18/21 0442  Weight: 57.7 kg 59.6 kg 59.8 kg    Physical Exam:  General exam: Awake and alert, no acute distress HEENT: Trach in place, secretions sounds but minimal are visible Respiratory system: Diffuse referred upper airway secretion sounds, otherwise lungs clear, trach collar at 5 L/min oxygen Cardiovascular system: Regular rate and rhythm, 2+ DP pulses bilaterally, no peripheral edema GI: PEG tube in place, abdomen soft nontender Neurologic: Grossly nonfocal exam, alert and oriented x3  Labs   Data Reviewed: I have personally reviewed following labs and imaging studies  CBC: Recent Labs  Lab 01/14/21 0416 01/17/21 0340  WBC 4.0 4.0  NEUTROABS  --  2.1  HGB 10.4* 10.3*  HCT 34.0* 32.0*  MCV 83.5 83.6  PLT 133* 448*   Basic Metabolic Panel: Recent Labs  Lab 01/17/21 0340  NA 141  K 4.1  CL 102  CO2 33*  GLUCOSE 90  BUN 23  CREATININE 0.62  CALCIUM 9.4   GFR: Estimated Creatinine Clearance: 67.4 mL/min (by C-G formula based on SCr of 0.62 mg/dL). Liver Function Tests: No results for input(s): AST, ALT, ALKPHOS, BILITOT, PROT, ALBUMIN in the last 168 hours. No results for input(s): LIPASE, AMYLASE in the last 168 hours. No results for input(s): AMMONIA in the last 168 hours. Coagulation Profile: No results for input(s): INR, PROTIME in the last 168 hours. Cardiac  Enzymes: No results for input(s): CKTOTAL, CKMB, CKMBINDEX, TROPONINI in the last 168 hours. BNP (last 3 results) No results for input(s): PROBNP in the last 8760 hours. HbA1C: No results for input(s): HGBA1C in the last 72 hours. CBG: Recent Labs  Lab 01/16/21 0529 01/17/21 0415 01/18/21 0332 01/18/21 0724 01/18/21 1227  GLUCAP 84 99 85 146* 86   Lipid Profile: No results for input(s): CHOL, HDL, LDLCALC, TRIG, CHOLHDL, LDLDIRECT in the last 72 hours. Thyroid Function Tests: No results for input(s): TSH, T4TOTAL, FREET4, T3FREE, THYROIDAB in the last 72 hours. Anemia Panel: No results for input(s): VITAMINB12, FOLATE, FERRITIN, TIBC, IRON, RETICCTPCT in the last 72 hours. Sepsis Labs: No results for input(s): PROCALCITON, LATICACIDVEN in the last 168 hours.  No results found for this or any previous visit (from the past 240 hour(s)).    Imaging Studies   No results found.   Medications   Scheduled Meds:  amLODipine  5 mg Oral Daily   Chlorhexidine Gluconate  Cloth  6 each Topical Daily   enoxaparin (LOVENOX) injection  40 mg Subcutaneous Q24H   feeding supplement (OSMOLITE 1.2 CAL)  355 mL Per Tube 5 X Daily   free water  150 mL Per Tube Q6H   guaiFENesin  10 mL Per Tube Q8H   levETIRAcetam  750 mg Per Tube BID   levothyroxine  100 mcg Per Tube Q0600   polyethylene glycol  17 g Per Tube BID   QUEtiapine  50 mg Per Tube BID   senna  1 tablet Per Tube Daily   Continuous Infusions:     LOS: 35 days    Time spent: 20 minutes     Ezekiel Slocumb, DO [facility]  01/18/2021, 1:56 PM      If 7PM-7AM, please contact night-coverage. How to contact the [facility] Attending or Consulting provider [place] or covering provider during after hours [place], for this patient?    Check the care team in [facility] and look for a) attending/consulting [facility] provider listed and b) the [facility] team listed Log into [email or web address] and use [place]'s universal password to access.  If you do not have the password, please contact the hospital operator. Locate the [facility] provider you are looking for under [facility] and page to a number that you can be directly reached. If you still have difficulty reaching the provider, please page the [facility] (Director on Call) for the Hospitalists listed on amion for assistance.

## 2021-01-19 ENCOUNTER — Inpatient Hospital Stay: Payer: Medicaid Other

## 2021-01-19 DIAGNOSIS — Z931 Gastrostomy status: Secondary | ICD-10-CM | POA: Diagnosis not present

## 2021-01-19 DIAGNOSIS — J3802 Paralysis of vocal cords and larynx, bilateral: Secondary | ICD-10-CM | POA: Diagnosis not present

## 2021-01-19 DIAGNOSIS — Z930 Tracheostomy status: Secondary | ICD-10-CM | POA: Diagnosis not present

## 2021-01-19 DIAGNOSIS — I10 Essential (primary) hypertension: Secondary | ICD-10-CM | POA: Diagnosis not present

## 2021-01-19 LAB — CBC WITH DIFFERENTIAL/PLATELET
Abs Immature Granulocytes: 0.01 10*3/uL (ref 0.00–0.07)
Basophils Absolute: 0 10*3/uL (ref 0.0–0.1)
Basophils Relative: 0 %
Eosinophils Absolute: 0.1 10*3/uL (ref 0.0–0.5)
Eosinophils Relative: 3 %
HCT: 34.8 % — ABNORMAL LOW (ref 36.0–46.0)
Hemoglobin: 11.1 g/dL — ABNORMAL LOW (ref 12.0–15.0)
Immature Granulocytes: 0 %
Lymphocytes Relative: 34 %
Lymphs Abs: 1.5 10*3/uL (ref 0.7–4.0)
MCH: 26 pg (ref 26.0–34.0)
MCHC: 31.9 g/dL (ref 30.0–36.0)
MCV: 81.5 fL (ref 80.0–100.0)
Monocytes Absolute: 0.3 10*3/uL (ref 0.1–1.0)
Monocytes Relative: 7 %
Neutro Abs: 2.4 10*3/uL (ref 1.7–7.7)
Neutrophils Relative %: 56 %
Platelets: 140 10*3/uL — ABNORMAL LOW (ref 150–400)
RBC: 4.27 MIL/uL (ref 3.87–5.11)
RDW: 14.4 % (ref 11.5–15.5)
WBC: 4.3 10*3/uL (ref 4.0–10.5)
nRBC: 0 % (ref 0.0–0.2)

## 2021-01-19 LAB — PROCALCITONIN: Procalcitonin: <0.1 ng/mL

## 2021-01-19 IMAGING — DX DG CHEST 1V PORT
1 series · 1 of 1 positions shown · non-contrast
Comparison: Chest radiograph [DATE].

CLINICAL DATA: Cough.

EXAM:
PORTABLE CHEST 1 VIEW

[chest ap]
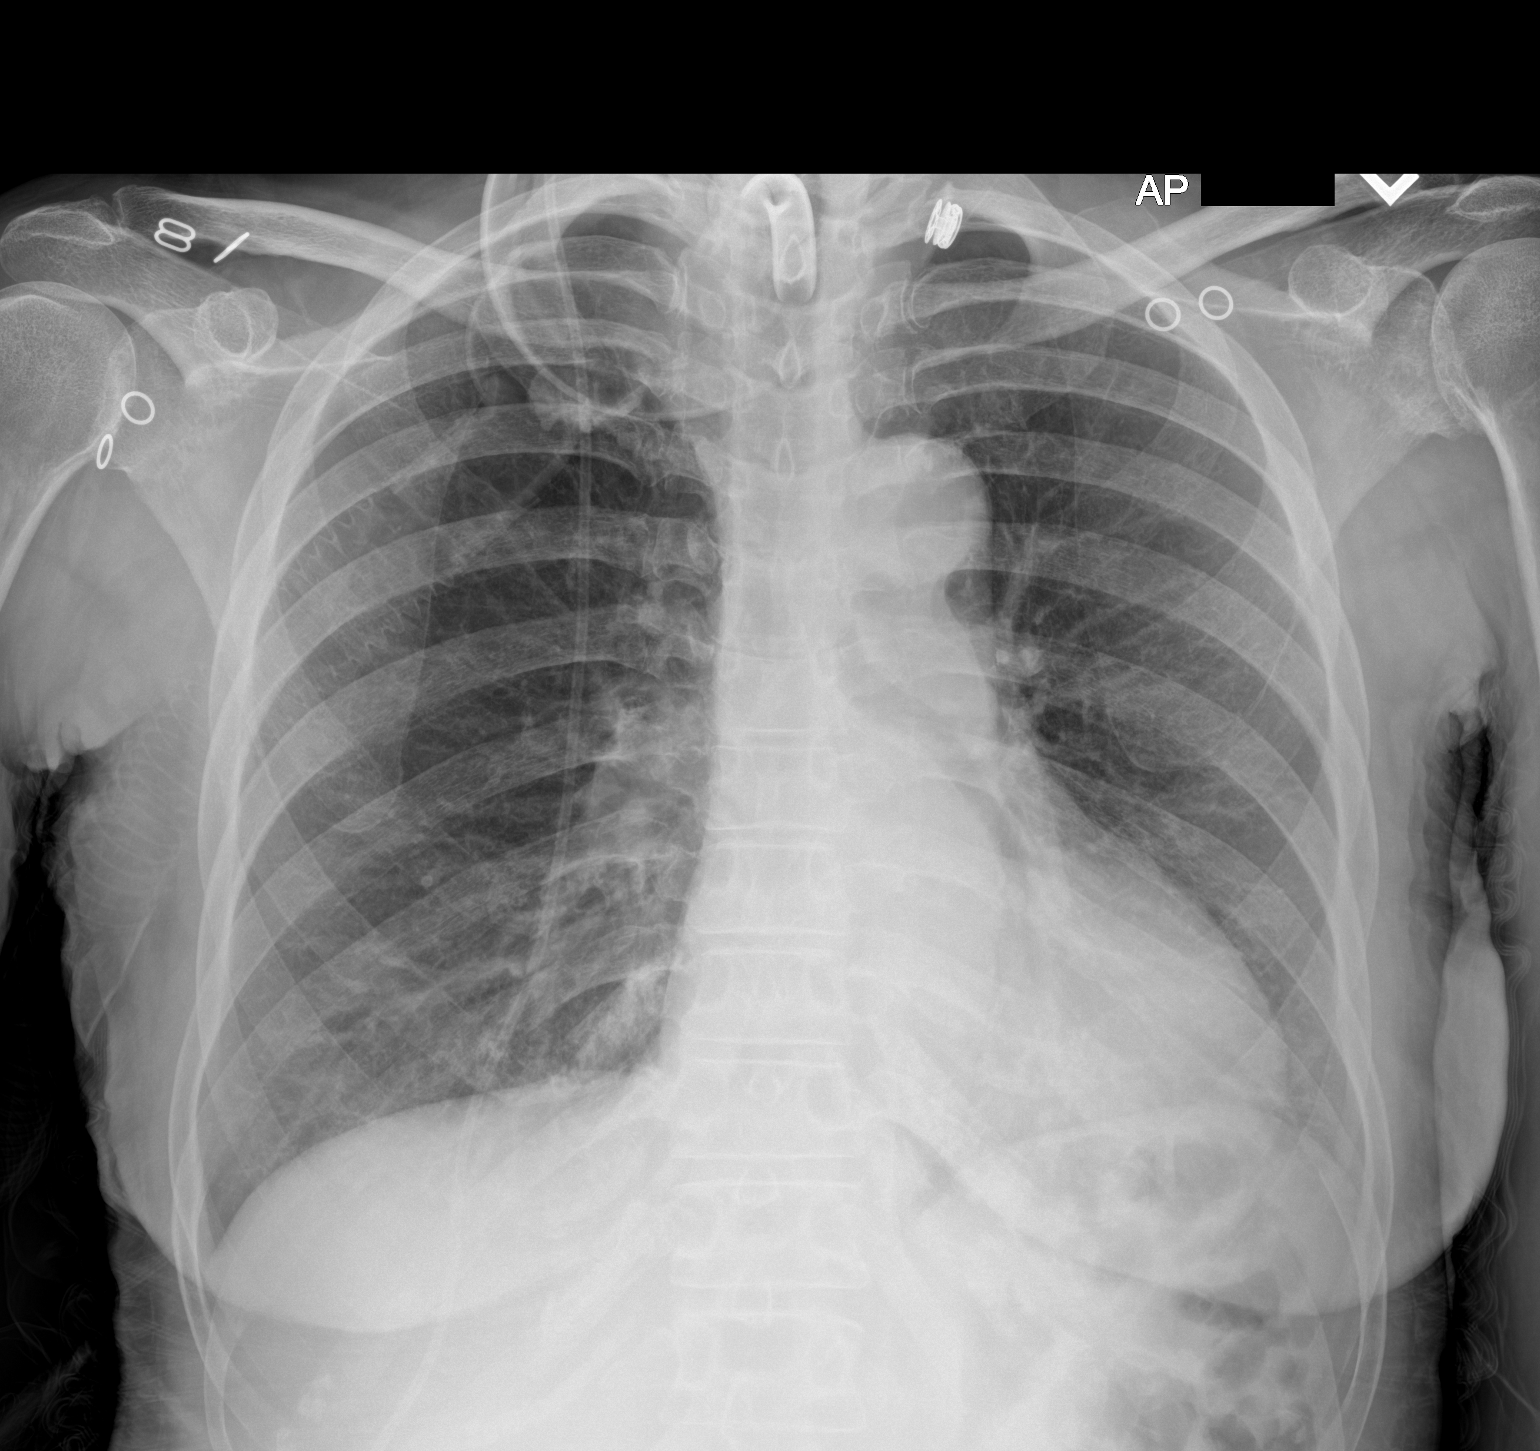

[1 of 1 positions shown; findings below may reference images not displayed]

FINDINGS: Similar streaky left basilar opacities. No visible pleural effusions
or pneumothorax. No tracheostomy tube tip projects midline over the
tracheal air column at the level of the clavicular heads. Catheter
tubing courses along the imaged lower right neck, right chest and
right upper abdomen, probably ventricular shunt catheter.
Cardiomediastinal silhouette is within normal limits and unchanged.
IMPRESSION: 1. Similar streaky left basilar opacities, most likely atelectasis.
Infection and/or aspiration is not excluded.
2. New tracheostomy tube tip projects midline over the tracheal air
column at the level of the clavicular heads.

## 2021-01-19 NOTE — Progress Notes (Signed)
PROGRESS NOTE    Stacy Moran   VZC:[id number]  DOB: 1955-05-08  PCP: [name], MD    DOA: 12/14/2020 LOS: 82    Brief Narrative / Hospital Course to Date:   65yo with a history of ruptured intracranial aneurysm and subsequent SHD/SAH, bilateral true vocal cord paralysis, chronic tracheostomy and PEG, HTN, epilepsy, hypothyroidism, and VP shunt for recurrent hydrocephalus who presented from her SNF due to respiratory distress after her tracheostomy tube apparently became dislodged.   On arrival, she was emergently taken to the OR, intubated, and her tracheostomy tube was replaced by ENT, Dr. Pryor Ochoa after dilation of the existing fistula.  She has subsequently stabilized.  Has been ready for discharge to skilled nursing facility for many days now,  however no bed has been available.   Assessment & Plan   Active Problems:   Tracheostomy status ([place])   Acute respiratory failure (HCC)   PEG (percutaneous endoscopic gastrostomy) status ([place])   Essential hypertension   Bilateral vocal cord paralysis  Acute hypoxic respiratory failure secondary to displaced chronic tracheostomy Chronic bilateral vocal cord dysfunction/paralysis ENT emergently took the patient to the OR for replacement of her chronic tracheostomy at the time of presentation -has been educated on care of new trach by RT  Patient is stable from a respiratory standpoint  Follow-up with Dr. Pryor Ochoa from ENT in 1 month.  Cuffed trach changed to uncuffed trach on 10/13. Difficult placement due to trach.  Dr. Pryor Ochoa advises that if it becomes dislodged, that it should be replaced w/ deflated cuffed tube to help prevent accidental removal.  10/24: Patient reporting increased secretions and difficulty clearing, more frequent suctioning needed, no improvement with breathing treatment. --Chest x-ray, procalcitonin and CBC pending  --May start antibiotics based on above results --Monitor closely   Dysphagia due to  bilateral vocal cord dysfunction Continue tube feeding per bolus protocol  Patient was evaluated by RD. SLP evaluated 10/22 - significant cough with trials of single ice chips --NPO   Type 2 diabetes with hypoglycemia Appears to be very sensitive to insulin. Hypoglycemia resolved with discontinuation of SSI  Hbg A1c 6.2%  CBGs controlled, monitor   Essential HTN -blood pressures have been controlled, more recently mildly elevated. Appears to take amlodipine 10 mg at home. --Start amlodipine 5 mg daily   Thrombocytopenia Lovenox was initially held when platelet count dropped below 100,000. Platelet count now stable > 100.  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   DVT prophylaxis: enoxaparin (LOVENOX) injection 40 mg Start: 01/05/21 0930 Place and maintain sequential compression device Start: 12/22/20 1287   Diet:  Diet Orders (From admission, onward)     Start     Ordered   01/17/21 0754  Diet NPO time specified  Diet effective now        01/17/21 0753              Code Status: Full Code   Subjective 01/19/21    Patient reports increased issues with thicker secretions today and feeling short of breath.  She says this did not get better after breathing treatment.  Denies any fevers or chills.  Disposition Plan  ENT   Status is: Inpatient  Remains inpatient appropriate because: Awaiting SNF placement, difficult placement due to cuffed trach status     Consults, Procedures, Significant Events   Consultants:  ENT  Procedures:  Tracheostomy replacement 10/13  Antimicrobials:  Anti-infectives (From admission,  onward)    None         Micro    Objective   Vitals:   01/18/21 2058 01/18/21 2339 01/19/21 0529 01/19/21 0856  BP: 130/83  118/85 127/87  Pulse: 71  68 79  Resp: 16  18 18   Temp: 98.2 F (36.8 C)  98.8 F (37.1 C) 98.4 F (36.9 C)  TempSrc: Oral  Oral Oral  SpO2: 100% 98% 100% 100%  Weight:      Height:        Intake/Output Summary (Last 24 hours) at 01/19/2021 1451 Last data filed at 01/18/2021 1743 Gross per 24 hour  Intake 355 ml  Output --  Net 355 ml   Filed Weights   01/16/21 0500 01/17/21 0418 01/18/21 0442  Weight: 57.7 kg 59.6 kg 59.8 kg    Physical Exam:  General exam: Awake and alert, no acute distress HEENT: Trach in place with PMV, coarse audible secretion sounds Respiratory system: Diffuse rhonchi versus referred upper airway secretion sounds, expiratory wheezes, trach collar 5 L/min supplemental O2 Cardiovascular system: Regular rate and rhythm, 2+ DP pulses bilaterally, no peripheral edema GI: PEG tube in place, abdomen soft nontender Neurologic: Grossly nonfocal exam, alert and oriented x3  Labs   Data Reviewed: I have personally reviewed following labs and imaging studies  CBC: Recent Labs  Lab 01/14/21 0416 01/17/21 0340 01/19/21 1401  WBC 4.0 4.0 4.3  NEUTROABS  --  2.1 2.4  HGB 10.4* 10.3* 11.1*  HCT 34.0* 32.0* 34.8*  MCV 83.5 83.6 81.5  PLT 133* 141* 622*   Basic Metabolic Panel: Recent Labs  Lab 01/17/21 0340  NA 141  K 4.1  CL 102  CO2 33*  GLUCOSE 90  BUN 23  CREATININE 0.62  CALCIUM 9.4   GFR: Estimated Creatinine Clearance: 67.4 mL/min (by C-G formula based on SCr of 0.62 mg/dL). Liver Function Tests: No results for input(s): AST, ALT, ALKPHOS, BILITOT, PROT, ALBUMIN in the last 168 hours. No results for input(s): LIPASE, AMYLASE in the last 168 hours. No results for input(s): AMMONIA in the last 168 hours. Coagulation Profile: No results for input(s): INR, PROTIME in the last 168 hours. Cardiac  Enzymes: No results for input(s): CKTOTAL, CKMB, CKMBINDEX, TROPONINI in the last 168 hours. BNP (last 3 results) No results for input(s): PROBNP in the last 8760 hours. HbA1C: No results for input(s): HGBA1C in the last 72 hours. CBG: Recent Labs  Lab 01/16/21 0529 01/17/21 0415 01/18/21 0332 01/18/21 0724 01/18/21 1227  GLUCAP 84 99 85 146* 86   Lipid Profile: No results for input(s): CHOL, HDL, LDLCALC, TRIG, CHOLHDL, LDLDIRECT in the last 72 hours. Thyroid Function Tests: No results for input(s): TSH, T4TOTAL, FREET4, T3FREE, THYROIDAB in the last 72 hours. Anemia Panel: No results for input(s): VITAMINB12, FOLATE, FERRITIN, TIBC, IRON, RETICCTPCT in the last 72 hours. Sepsis Labs: No results for input(s): PROCALCITON, LATICACIDVEN in the last 168 hours.  No results found for this or any previous visit (from the past 240 hour(s)).    Imaging Studies   No results found.   Medications   Scheduled Meds:  amLODipine  5 mg Per Tube Daily   Chlorhexidine Gluconate Cloth  6 each Topical Daily  enoxaparin (LOVENOX) injection  40 mg Subcutaneous Q24H   feeding supplement (OSMOLITE 1.2 CAL)  355 mL Per Tube 5 X Daily   free water  150 mL Per Tube Q6H   guaiFENesin  10 mL Per Tube Q8H   levETIRAcetam  750 mg Per Tube BID   levothyroxine  100 mcg Per Tube Q0600   polyethylene glycol  17 g Per Tube BID   QUEtiapine  50 mg Per Tube BID   senna  1 tablet Per Tube Daily   Continuous Infusions:     LOS: 36 days    Time spent: 30 minutes with > 50% spent at bedside and in coordination of care      Ezekiel Slocumb, DO [facility]  01/19/2021, 2:51 PM      If 7PM-7AM, please contact night-coverage. How to contact the [facility] Attending or Consulting provider [place] or covering provider during after hours [place], for this patient?    Check the care team in [facility] and look for a) attending/consulting TRH provider listed and b) the [facility] team listed Log into  [email or web address] and use [place]'s universal password to access. If you do not have the password, please contact the hospital operator. Locate the [facility] provider you are looking for under [facility] and page to a number that you can be directly reached. If you still have difficulty reaching the provider, please page the [facility] (Director on Call) for the Hospitalists listed on amion for assistance.

## 2021-01-20 DIAGNOSIS — Z931 Gastrostomy status: Secondary | ICD-10-CM | POA: Diagnosis not present

## 2021-01-20 DIAGNOSIS — Z930 Tracheostomy status: Secondary | ICD-10-CM | POA: Diagnosis not present

## 2021-01-20 DIAGNOSIS — J3802 Paralysis of vocal cords and larynx, bilateral: Secondary | ICD-10-CM | POA: Diagnosis not present

## 2021-01-20 LAB — GLUCOSE, CAPILLARY
Glucose-Capillary: 115 mg/dL — ABNORMAL HIGH (ref 70–99)
Glucose-Capillary: 168 mg/dL — ABNORMAL HIGH (ref 70–99)

## 2021-01-20 LAB — CBC
HCT: 33.1 % — ABNORMAL LOW (ref 36.0–46.0)
Hemoglobin: 10.5 g/dL — ABNORMAL LOW (ref 12.0–15.0)
MCH: 26.3 pg (ref 26.0–34.0)
MCHC: 31.7 g/dL (ref 30.0–36.0)
MCV: 83 fL (ref 80.0–100.0)
Platelets: 145 10*3/uL — ABNORMAL LOW (ref 150–400)
RBC: 3.99 MIL/uL (ref 3.87–5.11)
RDW: 14.6 % (ref 11.5–15.5)
WBC: 3.9 10*3/uL — ABNORMAL LOW (ref 4.0–10.5)
nRBC: 0 % (ref 0.0–0.2)

## 2021-01-20 LAB — PROCALCITONIN: Procalcitonin: <0.1 ng/mL

## 2021-01-20 NOTE — Progress Notes (Signed)
PROGRESS NOTE    Stacy Moran   ZRA:[id number]  DOB: 1955-10-14  PCP: [name], MD    DOA: 12/14/2020 LOS: 41    Brief Narrative / Hospital Course to Date:   65yo with a history of ruptured intracranial aneurysm and subsequent SHD/SAH, bilateral true vocal cord paralysis, chronic tracheostomy and PEG, HTN, epilepsy, hypothyroidism, and VP shunt for recurrent hydrocephalus who presented from her SNF due to respiratory distress after her tracheostomy tube apparently became dislodged.   On arrival, she was emergently taken to the OR, intubated, and her tracheostomy tube was replaced by ENT, Dr. Pryor Ochoa after dilation of the existing fistula.  She has subsequently stabilized.  Has been ready for discharge to skilled nursing facility for many days now,  however no bed has been available.   Assessment & Plan   Active Problems:   Tracheostomy status ([place])   Acute respiratory failure (HCC)   PEG (percutaneous endoscopic gastrostomy) status ([place])   Essential hypertension   Bilateral vocal cord paralysis  Acute hypoxic respiratory failure secondary to displaced chronic tracheostomy Chronic bilateral vocal cord dysfunction/paralysis ENT emergently took the patient to the OR for replacement of her chronic tracheostomy at the time of presentation -has been educated on care of new trach by RT  Patient is stable from a respiratory standpoint  Follow-up with Dr. Pryor Ochoa from ENT in 1 month.  Cuffed trach changed to uncuffed trach on 10/13. Difficult placement due to trach.  Dr. Pryor Ochoa advises that if it becomes dislodged, that it should be replaced w/ deflated cuffed tube to help prevent accidental removal.  10/24: Patient reporting increased secretions and difficulty clearing, more frequent suctioning needed, feeling short of breath with no improvement after breathing treatment. -- Chest x-ray showed left basilar atelectasis versus infiltrate -- Procalcitonin negative, no  leukocytosis, patient afebrile -- Antibiotics deferred -- Monitor closely 10/25: Patient notes significantly improved secretions and feeling well --Continue to monitor    Dysphagia due to bilateral vocal cord dysfunction Continue tube feeding per bolus protocol  Patient was evaluated by RD. SLP evaluated 10/22 - significant cough with trials of single ice chips -- Continue NPO status   Type 2 diabetes with hypoglycemia Appears to be very sensitive to insulin. Hypoglycemia resolved with discontinuation of SSI  Hbg A1c 6.2%  CBGs controlled, monitor   Essential HTN -blood pressures have been controlled, more recently mildly elevated. Appears to take amlodipine 10 mg at home. --Started amlodipine 5 mg daily   Thrombocytopenia Lovenox was initially held when platelet count dropped below 100,000. Platelet count now stable > 100.  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   DVT prophylaxis: enoxaparin (LOVENOX) injection 40 mg Start: 01/05/21 0930 Place and maintain sequential compression device Start: 12/22/20 5456   Diet:  Diet Orders (From admission, onward)     Start     Ordered   01/17/21 0754  Diet NPO time specified  Diet effective now        01/17/21 2563              Code Status: Full Code   Subjective 01/20/21    Patient reports doing better today.  Secretions much better and not having any cough, fever/chills or shortness of breath.  Reports feeling well but hopes to get out of the hospital soon  Disposition Plan ENT   Status is: Inpatient  Remains inpatient appropriate because: Awaiting SNF placement,  difficult placement due to cuffed trach status     Consults, Procedures, Significant Events   Consultants:  ENT  Procedures:  Tracheostomy replacement 10/13  Antimicrobials:  Anti-infectives (From admission, onward)    None         Micro    Objective   Vitals:   01/20/21 0606 01/20/21 0755 01/20/21 0820 01/20/21 1212  BP: 122/82  115/77 111/80  Pulse: 82  83 78  Resp: 16  16 16   Temp: 98.1 F (36.7 C)  98.8 F (37.1 C) 98.6 F (37 C)  TempSrc:   Oral Oral  SpO2: 97% 98% 98% 99%  Weight:      Height:       No intake or output data in the 24 hours ending 01/20/21 1259  Filed Weights   01/16/21 0500 01/17/21 0418 01/18/21 0442  Weight: 57.7 kg 59.6 kg 59.8 kg    Physical Exam:  General exam: Awake and alert, no acute distress HEENT: Trach in place using PMV, no visible secretions  Respiratory system: lungs clear bilaterally without secretion sounds today, no wheezes rales or rhonci, trach collar 5 L/min supplemental O2 Cardiovascular system: Regular rate and rhythm, S1/S2+, no peripheral edema GI: PEG tube in place, abdomen soft nontender Neurologic: Grossly nonfocal exam, alert and oriented x3  Labs   Data Reviewed: I have personally reviewed following labs and imaging studies  CBC: Recent Labs  Lab 01/14/21 0416 01/17/21 0340 01/19/21 1401 01/20/21 0531  WBC 4.0 4.0 4.3 3.9*  NEUTROABS  --  2.1 2.4  --   HGB 10.4* 10.3* 11.1* 10.5*  HCT 34.0* 32.0* 34.8* 33.1*  MCV 83.5 83.6 81.5 83.0  PLT 133* 141* 140* 671*   Basic Metabolic Panel: Recent Labs  Lab 01/17/21 0340  NA 141  K 4.1  CL 102  CO2 33*  GLUCOSE 90  BUN 23  CREATININE 0.62  CALCIUM 9.4   GFR: Estimated Creatinine Clearance: 67.4 mL/min (by C-G formula based on SCr of 0.62 mg/dL). Liver Function Tests: No results for input(s): AST, ALT, ALKPHOS, BILITOT, PROT, ALBUMIN in the last 168 hours. No results for input(s): LIPASE, AMYLASE in the last 168 hours. No results for  input(s): AMMONIA in the last 168 hours. Coagulation Profile: No results for input(s): INR, PROTIME in the last 168 hours. Cardiac Enzymes: No results for input(s): CKTOTAL, CKMB, CKMBINDEX, TROPONINI in the last 168 hours. BNP (last 3 results) No results for input(s): PROBNP in the last 8760 hours. HbA1C: No results for input(s): HGBA1C in the last 72 hours. CBG: Recent Labs  Lab 01/18/21 0332 01/18/21 0724 01/18/21 1227 01/20/21 0602 01/20/21 0749  GLUCAP 85 146* 86 115* 168*   Lipid Profile: No results for input(s): CHOL, HDL, LDLCALC, TRIG, CHOLHDL, LDLDIRECT in the last 72 hours. Thyroid Function Tests: No results for input(s): TSH, T4TOTAL, FREET4, T3FREE, THYROIDAB in the last 72 hours. Anemia Panel: No results for input(s): VITAMINB12, FOLATE, FERRITIN, TIBC, IRON, RETICCTPCT in the last 72 hours. Sepsis Labs: Recent Labs  Lab 01/19/21 1401 01/20/21 0531  PROCALCITON <0.10 <0.10    No results found for this or any previous visit (from the past 240 hour(s)).    Imaging Studies   DG Chest Tops [facility]  Result Date: 01/19/2021 CLINICAL DATA:  Cough. EXAM: PORTABLE CHEST 1 VIEW COMPARISON:  Chest radiograph 12/14/2020. FINDINGS: Similar streaky left basilar opacities. No visible pleural effusions or pneumothorax. No tracheostomy tube tip projects midline over the tracheal air column at the level of the clavicular heads. Catheter tubing courses along the imaged lower right neck, right chest and right upper abdomen, probably ventricular shunt catheter. Cardiomediastinal silhouette is within normal limits and unchanged. IMPRESSION: 1. Similar streaky left basilar opacities, most likely atelectasis. Infection and/or aspiration is not excluded. 2. New tracheostomy tube tip projects midline over the tracheal air column at the level of the clavicular heads. Electronically Signed   By: Margaretha Sheffield M.D.   On: 01/19/2021 19:51     Medications   Scheduled Meds:   amLODipine  5 mg Per Tube Daily   Chlorhexidine Gluconate Cloth  6 each Topical Daily   enoxaparin (LOVENOX) injection  40 mg Subcutaneous Q24H   feeding supplement (OSMOLITE 1.2 CAL)  355 mL Per Tube 5 X Daily   free water  150 mL Per Tube Q6H   guaiFENesin  10 mL Per Tube Q8H   levETIRAcetam  750 mg Per Tube BID   levothyroxine  100 mcg Per Tube Q0600   polyethylene glycol  17 g Per Tube BID   QUEtiapine  50 mg Per Tube BID   senna  1 tablet Per Tube Daily   Continuous Infusions:     LOS: 37 days    Time spent: 25 minutes with > 50% spent at bedside and in coordination of care      Ezekiel Slocumb, DO [facility]  01/20/2021, 12:59 PM      If 7PM-7AM, please contact night-coverage. How to contact the [facility] Attending or Consulting provider [place] or covering provider during after hours [place], for this patient?    Check the care team in [facility] and look for a) attending/consulting TRH provider listed and b) the [facility] team listed Log into [email or web address] and use [place]'s universal password to access. If you do not have the password, please contact the hospital operator. Locate the [facility] provider you are looking for under [facility] and page to a number that you can be directly reached. If you still have difficulty reaching the provider, please page the [facility] (Director on Call) for the Hospitalists listed on amion for assistance.

## 2021-01-21 DIAGNOSIS — Z931 Gastrostomy status: Secondary | ICD-10-CM | POA: Diagnosis not present

## 2021-01-21 DIAGNOSIS — Z930 Tracheostomy status: Secondary | ICD-10-CM | POA: Diagnosis not present

## 2021-01-21 DIAGNOSIS — J9601 Acute respiratory failure with hypoxia: Secondary | ICD-10-CM | POA: Diagnosis not present

## 2021-01-21 DIAGNOSIS — I10 Essential (primary) hypertension: Secondary | ICD-10-CM | POA: Diagnosis not present

## 2021-01-21 LAB — GLUCOSE, CAPILLARY: Glucose-Capillary: 97 mg/dL (ref 70–99)

## 2021-01-21 LAB — PROCALCITONIN: Procalcitonin: <0.1 ng/mL

## 2021-01-21 NOTE — Hospital Course (Addendum)
65yo with a history of ruptured intracranial aneurysm and subsequent SHD/SAH, bilateral true vocal cord paralysis, chronic tracheostomy and PEG, HTN, epilepsy, hypothyroidism, and VP shunt for recurrent hydrocephalus who presented from her SNF due to respiratory distress after her tracheostomy tube apparently became dislodged.   On arrival, she was emergently taken to the OR, intubated, and her tracheostomy tube was replaced by ENT, Dr. Pryor Ochoa after dilation of the existing fistula.  She has subsequently stabilized.  Has been ready for discharge to skilled nursing facility for many days now,  however no SNF bed has been available.   10/26-11/1: Waiting for SNF.  Difficult placement due to trach and PEG

## 2021-01-21 NOTE — Assessment & Plan Note (Signed)
ENT emergently took the patient to the OR for replacement of her chronic tracheostomy at the time of presentation -has been educated on care of new trach by RT. Patient is stable from a respiratory standpoint  Follow-up with Dr. Pryor Ochoa from ENT in 1 month. Cuffed trach changed to uncuffed trach on 10/13. Difficult placement due to trach.  Dr. Pryor Ochoa advises that if it becomes dislodged, that it should be replaced w/ deflated cuffed tube to help prevent accidental removal.

## 2021-01-21 NOTE — Assessment & Plan Note (Addendum)
Currently NPO.  Continue tube feed per dietitian.  Outpatient ENT follow-up

## 2021-01-21 NOTE — Assessment & Plan Note (Signed)
Well-controlled at this time.  Continue amlodipine

## 2021-01-21 NOTE — Progress Notes (Signed)
Nutrition Follow-up  DOCUMENTATION CODES:  Severe malnutrition in context of chronic illness  INTERVENTION:  Continue TF via PEG. 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 - ongoing  GOAL:  Patient will meet greater than or equal to 90% of their needs - being addressed with TF  MONITOR:  TF tolerance, Labs, Weight trends, Vent status, I & O's  REASON FOR ASSESSMENT:  Consult Enteral/tube feeding initiation and management  ASSESSMENT:  65 yo female with PMH of vocal cord paralysis with chronic tracheostomy, HTN, hx subarachnoid hemorrhage, chronic PEG tube, anxiety, and ventriculoperitoneal shunt for recurrent hydrocephalus presented to ED from [place] with respiratory distress. Patient was found in respiratory distress in her room with a tracheostomy tube on the floor.  Pt continues to be medically stable and ready for discharge pending placement. Weight also remains stable. Chemistry lab this week WNL.   Will Continue current nutrition plan with no changes needed at this time.  Nutritionally Relevant Medications: Scheduled Meds:  feeding supplement (OSMOLITE 1.2 CAL)  355 mL Per Tube 5 X Daily   free water  150 mL Per Tube Q6H   polyethylene glycol  17 g Per Tube BID   senna  1 tablet Per Tube Daily   PRN Meds: docusate  Labs Reviewed  NUTRITION - FOCUSED PHYSICAL EXAM: Flowsheet Row Most Recent Value  Orbital Region Mild depletion  Upper Arm Region Severe depletion  Thoracic and Lumbar Region Moderate depletion  Buccal Region Moderate depletion  Temple Region Moderate depletion  Clavicle Bone Region Moderate depletion   Clavicle and Acromion Bone Region Moderate depletion  Scapular Bone Region Mild depletion  Dorsal Hand Moderate depletion  Patellar Region Severe depletion  Anterior Thigh Region Severe depletion  Posterior Calf Region Severe depletion  Edema (RD Assessment) None  Hair Reviewed  Eyes Reviewed  Mouth Reviewed  Skin Reviewed  Nails Reviewed   Diet Order:   Diet Order             Diet NPO time specified  Diet effective now                  EDUCATION NEEDS:  Education needs have been addressed  Skin:  Skin Assessment: Reviewed RN Assessment  Last BM:  10/25 - per RN documentation  Height:  Ht Readings from Last 1 Encounters:  12/14/20 5\' 6"  (1.676 m)   Weight:  Wt Readings from Last 1 Encounters:  01/21/21 58.8 kg   Ideal Body Weight:  59.1 kg  BMI:  Body mass index is 20.92 kg/m.  Estimated Nutritional Needs:  Kcal:  1800-2000 kcal Protein:  90-100 g/d Fluid:  1700-1900 mL/d   Ranell Patrick, RD, LDN Clinical Dietitian RD pager # available in AMION  After hours/weekend pager # available in [facility]

## 2021-01-21 NOTE — Assessment & Plan Note (Signed)
S/p replacement of her chronic tracheostomy on admission.  Cuffed trach changed to uncuffed trach on 10/13 by ENT.  Outpatient follow-up with Dr. Pryor Ochoa from ENT in 1 month at discharge

## 2021-01-21 NOTE — Assessment & Plan Note (Signed)
Due to bilateral vocal cord dysfunction.  Speech therapy recommended n.p.o. status and continue tube feeding per RD

## 2021-01-21 NOTE — Progress Notes (Signed)
  Progress Note    Stacy Moran   [id number]  DOB: Apr 01, 1955  DOA: 12/14/2020     40 Date of Service: 01/21/2021   Clinical Course  65yo with a history of ruptured intracranial aneurysm and subsequent SHD/SAH, bilateral true vocal cord paralysis, chronic tracheostomy and PEG, HTN, epilepsy, hypothyroidism, and VP shunt for recurrent hydrocephalus who presented from her SNF due to respiratory distress after her tracheostomy tube apparently became dislodged.   On arrival, she was emergently taken to the OR, intubated, and her tracheostomy tube was replaced by ENT, Dr. Pryor Ochoa after dilation of the existing fistula.  She has subsequently stabilized.  Has been ready for discharge to skilled nursing facility for many days now,  however no bed has been available.   10/26 - waiting for SNF   Assessment and Plan Bilateral vocal cord paralysis Currently NPO.  Continue tube feed per dietitian.  Outpatient ENT follow-up  Essential hypertension Well-controlled at this time.  Continue amlodipine  PEG (percutaneous endoscopic gastrostomy) status ([place]) Due to bilateral vocal cord dysfunction.  Speech therapy recommended n.p.o. status and continue tube feeding per RD  Acute respiratory failure [facility]) ENT emergently took the patient to the OR for replacement of her chronic tracheostomy at the time of presentation -has been educated on care of new trach by RT. Patient is stable from a respiratory standpoint  Follow-up with Dr. Pryor Ochoa from ENT in 1 month. Cuffed trach changed to uncuffed trach on 10/13. Difficult placement due to trach.  Dr. Pryor Ochoa advises that if it becomes dislodged, that it should be replaced w/ deflated cuffed tube to help prevent accidental removal.  Tracheostomy status [facility]) S/p replacement of her chronic tracheostomy on admission.  Cuffed trach changed to uncuffed trach on 10/13 by ENT.  Outpatient follow-up with Dr. Pryor Ochoa from ENT in 1 month at  discharge     Subjective:  Patient is hoping to get discharged soon but still waiting for SNF.  Objective Vitals:   01/21/21 0730 01/21/21 0805 01/21/21 1130 01/21/21 1333  BP: 117/73  118/76   Pulse: 80  80   Resp: 16  18   Temp: 98.8 F (37.1 C)  99 F (37.2 C)   TempSrc: Oral  Oral   SpO2: 96% 99% 98% 99%  Weight:      Height:       58.8 kg  Vital signs were reviewed and unremarkable.   Exam Physical Exam   General exam: Awake and alert, no acute distress HEENT: Trach in place using PMV, no visible secretions  Respiratory system: lungs clear bilaterally, no wheezes rales or rhonci, trach collar 5 L/min supplemental O2 Cardiovascular system: Regular rate and rhythm, S1/S2+, no peripheral edema GI: PEG tube in place, abdomen soft nontender Neurologic: Grossly nonfocal exam, alert and oriented x3  Labs / Other Information My review of labs, imaging, notes and other tests shows no new significant findings.    Disposition Plan: Status is: Inpatient  Remains inpatient appropriate because: Waiting for SNF     Time spent: 25 minutes [facility] 01/21/2021, 2:50 PM

## 2021-01-22 DIAGNOSIS — I10 Essential (primary) hypertension: Secondary | ICD-10-CM | POA: Diagnosis not present

## 2021-01-22 DIAGNOSIS — J3802 Paralysis of vocal cords and larynx, bilateral: Secondary | ICD-10-CM | POA: Diagnosis not present

## 2021-01-22 DIAGNOSIS — Z930 Tracheostomy status: Secondary | ICD-10-CM | POA: Diagnosis not present

## 2021-01-22 DIAGNOSIS — J9601 Acute respiratory failure with hypoxia: Secondary | ICD-10-CM | POA: Diagnosis not present

## 2021-01-22 LAB — GLUCOSE, CAPILLARY: Glucose-Capillary: 100 mg/dL — ABNORMAL HIGH (ref 70–99)

## 2021-01-22 NOTE — Assessment & Plan Note (Signed)
S/p replacement of her chronic tracheostomy on admission.  Cuffed trach changed to uncuffed trach on 10/13 by ENT.  Outpatient follow-up with Dr. Pryor Ochoa from ENT in 1 month at discharge.

## 2021-01-22 NOTE — Assessment & Plan Note (Signed)
Due to bilateral vocal cord dysfunction.  Speech therapy recommends n.p.o. status and continue tube feeding per RD

## 2021-01-22 NOTE — Assessment & Plan Note (Signed)
Currently NPO.  Continue tube feed per dietitian.  Outpatient ENT follow-up.

## 2021-01-22 NOTE — TOC Progression Note (Signed)
Transition of Care [facility]) - Progression Note    Patient Details  Name: Stacy Moran MRN: [id number] Date of Birth: 01/31/1956  Transition of Care [facility]) CM/SW Contact  [name], RN Phone Number: 01/22/2021, 4:24 PM  Clinical Narrative:    No bed offers at this time.  TOC continues to look for placement that can accept a trach patient.   Expected Discharge Plan: [place] Barriers to Discharge: No SNF bed  Expected Discharge Plan and Services Expected Discharge Plan: [place]   Discharge Planning Services: CM Consult Post Acute Care Choice: [place] Living arrangements for the past 2 months: [place]                 DME Arranged: N/A DME Agency: NA       HH Arranged: NA HH Agency: NA         Social Determinants of Health (SDOH) Interventions    Readmission Risk Interventions Readmission Risk Prevention Plan 12/31/2020  Transportation Screening Complete  Medication Review [employer]) Complete  PCP or Specialist appointment within 3-5 days of discharge Complete  HRI or Home Care Consult Complete  SW Recovery Care/Counseling Consult Complete  Palliative Care Screening Not [place] Complete

## 2021-01-22 NOTE — Progress Notes (Signed)
  Progress Note    Stacy Moran   [id number]  DOB: [birth year]  DOA: 12/14/2020     41 Date of Service: 01/22/2021   Clinical Course  65yo with a history of ruptured intracranial aneurysm and subsequent SHD/SAH, bilateral true vocal cord paralysis, chronic tracheostomy and PEG, HTN, epilepsy, hypothyroidism, and VP shunt for recurrent hydrocephalus who presented from her SNF due to respiratory distress after her tracheostomy tube apparently became dislodged.   On arrival, she was emergently taken to the OR, intubated, and her tracheostomy tube was replaced by ENT, Dr. Pryor Ochoa after dilation of the existing fistula.  She has subsequently stabilized.  Has been ready for discharge to skilled nursing facility for many days now,  however no bed has been available.   10/26 - waiting for SNF 10/27 -pending SNF placement   Assessment and Plan Bilateral vocal cord paralysis Currently NPO.  Continue tube feed per dietitian.  Outpatient ENT follow-up.  Essential hypertension Well-controlled at this time.  Continue amlodipine via PEG  PEG (percutaneous endoscopic gastrostomy) status ([place]) Due to bilateral vocal cord dysfunction.  Speech therapy recommends n.p.o. status and continue tube feeding per RD  Acute respiratory failure [facility]) ENT emergently took the patient to the OR for replacement of her chronic tracheostomy at the time of presentation -has been educated on care of new trach by RT. Patient is stable from a respiratory standpoint  Follow-up with Dr. Pryor Ochoa from ENT in 1 month. Cuffed trach changed to uncuffed trach on 10/13. Difficult placement due to trach.  Dr. Pryor Ochoa recommends if it becomes dislodged, that it should be replaced w/ deflated cuffed tube to help prevent accidental removal.  Tracheostomy status [facility]) S/p replacement of her chronic tracheostomy on admission.  Cuffed trach changed to uncuffed trach on 10/13 by ENT.  Outpatient follow-up with Dr. Pryor Ochoa from ENT in 1  month at discharge.     Subjective:  No new issues.  Tired of being here  Objective Vitals:   01/22/21 0500 01/22/21 0745 01/22/21 1107 01/22/21 1120  BP:  123/78 (!) 143/84   Pulse:  79 78   Resp:  16 16   Temp:  98.5 F (36.9 C) 99.2 F (37.3 C)   TempSrc:  Oral    SpO2:  93% 98% 96%  Weight: 58.1 kg     Height:       58.1 kg  Vital signs were reviewed and unremarkable.   Exam Physical Exam   General exam: Awake and alert, no acute distress HEENT: Trach in placeusingPMV,no visible secretions Respiratory system:lungs clear bilaterally,nowheezesrales or rhonci, trach collar 5 L/min supplemental O2 Cardiovascular system:Regular rate and rhythm, S1/S2+, no peripheral edema GI: PEG tube in place, abdomen soft nontender Neurologic: Grossly nonfocal exam, alert and oriented x3   Labs / Other Information My review of labs, imaging, notes and other tests shows no new significant findings.    Disposition Plan: Status is: Inpatient  Remains inpatient appropriate because: Waiting for SNF placement and difficult dispo considering trach and PEG        Time spent: 20 minutes [facility] 01/22/2021, 1:09 PM

## 2021-01-22 NOTE — Assessment & Plan Note (Signed)
ENT emergently took the patient to the OR for replacement of her chronic tracheostomy at the time of presentation -has been educated on care of new trach by RT. Patient is stable from a respiratory standpoint  Follow-up with Dr. Pryor Ochoa from ENT in 1 month. Cuffed trach changed to uncuffed trach on 10/13. Difficult placement due to trach.  Dr. Pryor Ochoa recommends if it becomes dislodged, that it should be replaced w/ deflated cuffed tube to help prevent accidental removal.

## 2021-01-22 NOTE — Assessment & Plan Note (Signed)
Well-controlled at this time.  Continue amlodipine via PEG

## 2021-01-23 DIAGNOSIS — I10 Essential (primary) hypertension: Secondary | ICD-10-CM | POA: Diagnosis not present

## 2021-01-23 DIAGNOSIS — J3802 Paralysis of vocal cords and larynx, bilateral: Secondary | ICD-10-CM | POA: Diagnosis not present

## 2021-01-23 DIAGNOSIS — J9601 Acute respiratory failure with hypoxia: Secondary | ICD-10-CM | POA: Diagnosis not present

## 2021-01-23 LAB — GLUCOSE, CAPILLARY: Glucose-Capillary: 99 mg/dL (ref 70–99)

## 2021-01-23 MED ORDER — ALPRAZOLAM 0.5 MG PO TABS
0.5000 mg | ORAL_TABLET | Freq: Three times a day (TID) | ORAL | Status: DC | PRN
  Administered 2021-01-23 – 2021-02-17 (×19): 0.5 mg
  Filled 2021-01-23 (×19): qty 1

## 2021-01-23 MED ORDER — ALPRAZOLAM 0.5 MG PO TABS
0.5000 mg | ORAL_TABLET | Freq: Three times a day (TID) | ORAL | Status: DC | PRN
  Filled 2021-01-23: qty 1

## 2021-01-23 NOTE — Assessment & Plan Note (Addendum)
S/p replacement of her chronic tracheostomy on admission.  Cuffed trach changed to uncuffed trach on 10/13 by ENT.  Outpatient follow-up with Dr. Pryor Ochoa from ENT in 1 month at discharge

## 2021-01-23 NOTE — Assessment & Plan Note (Signed)
Well-controlled at this time.  Continue amlodipine via PEG

## 2021-01-23 NOTE — Progress Notes (Signed)
  Progress Note    Stacy Moran   [id number]  DOB: Oct 08, 1955  DOA: 12/14/2020     42 Date of Service: 01/23/2021   Clinical Course  65yo with a history of ruptured intracranial aneurysm and subsequent SHD/SAH, bilateral true vocal cord paralysis, chronic tracheostomy and PEG, HTN, epilepsy, hypothyroidism, and VP shunt for recurrent hydrocephalus who presented from her SNF due to respiratory distress after her tracheostomy tube apparently became dislodged.   On arrival, she was emergently taken to the OR, intubated, and her tracheostomy tube was replaced by ENT, Dr. Pryor Ochoa after dilation of the existing fistula.  She has subsequently stabilized.  Has been ready for discharge to skilled nursing facility for many days now,  however no bed has been available.   10/26 - waiting for SNF 10/27 -pending SNF placement 10/28: No SNF bed offers yet   Assessment and Plan Bilateral vocal cord paralysis Currently NPO.  Continue tube feed per dietitian.  Outpatient ENT follow-up  Essential hypertension Well-controlled at this time.  Continue amlodipine via PEG  PEG (percutaneous endoscopic gastrostomy) status ([place]) Due to bilateral vocal cord dysfunction.  Speech therapy recommends n.p.o. status and continue tube feeding per RD  Acute respiratory failure [facility]) ENT emergently took the patient to the OR for replacement of her chronic tracheostomy at the time of presentation -has been educated on care of new trach by RT. Patient is stable from a respiratory standpoint  Follow-up with Dr. Pryor Ochoa from ENT in 1 month. Cuffed trach changed to uncuffed trach on 10/13. Difficult placement due to trach.  Dr. Pryor Ochoa recommends if it becomes dislodged, that it should be replaced w/ deflated cuffed tube to help prevent accidental removal  Tracheostomy status [facility]) S/p replacement of her chronic tracheostomy on admission.  Cuffed trach changed to uncuffed trach on 10/13 by ENT.  Outpatient follow-up  with Dr. Pryor Ochoa from ENT in 1 month at discharge     Subjective:  Getting frustrated as she is not able to get a SNF bed yet  Objective Vitals:   01/23/21 0552 01/23/21 0735 01/23/21 0815 01/23/21 1127  BP: 111/77 124/86  (!) 142/95  Pulse: 73 78  79  Resp: 16 18  16   Temp:  98.5 F (36.9 C)  98.9 F (37.2 C)  TempSrc:  Oral  Oral  SpO2: 99% 98% 96% 100%  Weight:      Height:       58.1 kg  Vital signs were reviewed and unremarkable.   Exam Physical Exam   General exam: Awake and alert, no acute distress HEENT: Trach in placeusingPMV,no visible secretions Respiratory system:lungs clear bilaterally,nowheezesrales or rhonci, trach collar 5 L/min supplemental O2 Cardiovascular system:Regular rate and rhythm, S1/S2+, no peripheral edema GI: PEG tube in place, abdomen soft nontender Neurologic: Grossly nonfocal exam, alert and oriented x3  Labs / Other Information There are no new results to review at this time.   Disposition Plan: Status is: Inpatient  Remains inpatient appropriate because: Patient is clinically stable for discharge but no SNF bed offers yet    Time spent: 20 minutes [facility] 01/23/2021, 12:29 PM

## 2021-01-23 NOTE — Progress Notes (Signed)
Patient seen due to c/o difficulty breathing. Patient was seen earlier for svn/trach care and suctioning. Patient was on room air watching tv when I entered room. Patient does have issues with moderate/minimal secretions from trach as all trach patients. Suctioned for small amount of white secretions. Room air sats noted at 96%. Encouraged patient to keep trach collar at trach at all time to minimize sob and hardening of secretions. Patient asked about getting out of the bed. I followed up with RN to see if she knew why patient was not sitting in chair or walking with assistance when available to minimize some anxiety patient may be experiencing as well as moving secretions that may accumulate in lungs. Such would be beneficial for this patient if it is deemed that she is medically able to do so.

## 2021-01-23 NOTE — Assessment & Plan Note (Signed)
Due to bilateral vocal cord dysfunction.  Speech therapy recommends n.p.o. status and continue tube feeding per RD

## 2021-01-23 NOTE — Assessment & Plan Note (Addendum)
Currently NPO.  Continue tube feed per dietitian.  Outpatient ENT follow-up

## 2021-01-23 NOTE — Assessment & Plan Note (Signed)
ENT emergently took the patient to the OR for replacement of her chronic tracheostomy at the time of presentation -has been educated on care of new trach by RT. Patient is stable from a respiratory standpoint  Follow-up with Dr. Pryor Ochoa from ENT in 1 month. Cuffed trach changed to uncuffed trach on 10/13. Difficult placement due to trach.  Dr. Pryor Ochoa recommends if it becomes dislodged, that it should be replaced w/ deflated cuffed tube to help prevent accidental removal

## 2021-01-24 DIAGNOSIS — I10 Essential (primary) hypertension: Secondary | ICD-10-CM | POA: Diagnosis not present

## 2021-01-24 DIAGNOSIS — J9601 Acute respiratory failure with hypoxia: Secondary | ICD-10-CM | POA: Diagnosis not present

## 2021-01-24 DIAGNOSIS — J3802 Paralysis of vocal cords and larynx, bilateral: Secondary | ICD-10-CM | POA: Diagnosis not present

## 2021-01-24 LAB — CBC
HCT: 31.8 % — ABNORMAL LOW (ref 36.0–46.0)
Hemoglobin: 10.6 g/dL — ABNORMAL LOW (ref 12.0–15.0)
MCH: 27.6 pg (ref 26.0–34.0)
MCHC: 33.3 g/dL (ref 30.0–36.0)
MCV: 82.8 fL (ref 80.0–100.0)
Platelets: 134 10*3/uL — ABNORMAL LOW (ref 150–400)
RBC: 3.84 MIL/uL — ABNORMAL LOW (ref 3.87–5.11)
RDW: 14.5 % (ref 11.5–15.5)
WBC: 3.8 10*3/uL — ABNORMAL LOW (ref 4.0–10.5)
nRBC: 0 % (ref 0.0–0.2)

## 2021-01-24 LAB — CREATININE, SERUM
Creatinine, Ser: 0.48 mg/dL (ref 0.44–1.00)
GFR, Estimated: >60 mL/min (ref >60)

## 2021-01-24 LAB — GLUCOSE, CAPILLARY: Glucose-Capillary: 111 mg/dL — ABNORMAL HIGH (ref 70–99)

## 2021-01-24 NOTE — Assessment & Plan Note (Signed)
ENT emergently took the patient to the OR for replacement of her chronic tracheostomy at the time of presentation -has been educated on care of new trach by RT. Patient is stable from a respiratory standpoint  Follow-up with Dr. Pryor Ochoa from ENT in 1 month. Cuffed trach changed to uncuffed trach on 10/13. Difficult placement due to trach.  Dr. Pryor Ochoa recommends if it becomes dislodged, that it should be replaced w/ deflated cuffed tube to help prevent accidental removal.

## 2021-01-24 NOTE — Assessment & Plan Note (Signed)
Due to bilateral vocal cord dysfunction.  Speech therapy recommends n.p.o. status and continue tube feeding per RD.

## 2021-01-24 NOTE — Assessment & Plan Note (Signed)
Well-controlled at this time.  Continue amlodipine via PEG.

## 2021-01-24 NOTE — Progress Notes (Signed)
  Progress Note    Stacy Moran   [id number]  DOB: 04-16-1955  DOA: 12/14/2020     41 Date of Service: 01/24/2021   Clinical Course  65yo with a history of ruptured intracranial aneurysm and subsequent SHD/SAH, bilateral true vocal cord paralysis, chronic tracheostomy and PEG, HTN, epilepsy, hypothyroidism, and VP shunt for recurrent hydrocephalus who presented from her SNF due to respiratory distress after her tracheostomy tube apparently became dislodged.   On arrival, she was emergently taken to the OR, intubated, and her tracheostomy tube was replaced by ENT, Dr. Pryor Ochoa after dilation of the existing fistula.  She has subsequently stabilized.  Has been ready for discharge to skilled nursing facility for many days now,  however no bed has been available.   10/26 - waiting for SNF 10/27 -pending SNF placement 10/28: No SNF bed offers yet 10/29: pending SNF   Assessment and Plan Bilateral vocal cord paralysis Currently NPO.  Continue tube feed per dietitian.  Outpatient ENT follow-up.  Essential hypertension Well-controlled at this time.  Continue amlodipine via PEG.  PEG (percutaneous endoscopic gastrostomy) status ([place]) Due to bilateral vocal cord dysfunction.  Speech therapy recommends n.p.o. status and continue tube feeding per RD.  Acute respiratory failure [facility]) ENT emergently took the patient to the OR for replacement of her chronic tracheostomy at the time of presentation -has been educated on care of new trach by RT. Patient is stable from a respiratory standpoint  Follow-up with Dr. Pryor Ochoa from ENT in 1 month. Cuffed trach changed to uncuffed trach on 10/13. Difficult placement due to trach.  Dr. Pryor Ochoa recommends if it becomes dislodged, that it should be replaced w/ deflated cuffed tube to help prevent accidental removal.  Tracheostomy status [facility]) S/p replacement of her chronic tracheostomy on admission.  Cuffed trach changed to uncuffed trach on 10/13 by ENT.   Outpatient follow-up with Dr. Pryor Ochoa from ENT in 1 month at discharge.  Anxiety Low-dose Xanax added yesterday  Subjective:  No new issues  Objective Vitals:   01/24/21 0642 01/24/21 0820 01/24/21 0830 01/24/21 1131  BP: 120/89  123/88 134/78  Pulse: 80  75 78  Resp: 16  18 18   Temp: 98.6 F (37 C)  98 F (36.7 C) 97.8 F (36.6 C)  TempSrc: Oral  Oral Oral  SpO2: 100% 100% 98% 100%  Weight:      Height:       58.8 kg  Vital signs were reviewed and unremarkable.   Exam Physical Exam   General exam: Awake and alert, no acute distress HEENT: Trach in placeusingPMV,no visible secretions Respiratory system:lungs clear bilaterally,nowheezesrales or rhonci, trach collar 5 L/min supplemental O2 Cardiovascular system:Regular rate and rhythm, S1/S2+, no peripheral edema GI: PEG tube in place, abdomen soft nontender Neurologic: Grossly nonfocal exam, alert and oriented x3  Labs / Other Information My review of labs, imaging, notes and other tests shows no new significant findings.    Disposition Plan: Status is: Inpatient  Remains inpatient appropriate because: Pending SNF, difficult placement due to trach and PEG.  TOC aware and working on it   Time spent: 15 minutes [facility] 01/24/2021, 12:34 PM

## 2021-01-24 NOTE — Assessment & Plan Note (Signed)
S/p replacement of her chronic tracheostomy on admission.  Cuffed trach changed to uncuffed trach on 10/13 by ENT.  Outpatient follow-up with Dr. Pryor Ochoa from ENT in 1 month at discharge.

## 2021-01-24 NOTE — Assessment & Plan Note (Signed)
Currently NPO.  Continue tube feed per dietitian.  Outpatient ENT follow-up.

## 2021-01-25 DIAGNOSIS — J3802 Paralysis of vocal cords and larynx, bilateral: Secondary | ICD-10-CM | POA: Diagnosis not present

## 2021-01-25 DIAGNOSIS — I10 Essential (primary) hypertension: Secondary | ICD-10-CM | POA: Diagnosis not present

## 2021-01-25 DIAGNOSIS — J9601 Acute respiratory failure with hypoxia: Secondary | ICD-10-CM | POA: Diagnosis not present

## 2021-01-25 LAB — GLUCOSE, CAPILLARY: Glucose-Capillary: 77 mg/dL (ref 70–99)

## 2021-01-25 NOTE — Assessment & Plan Note (Signed)
Currently NPO.  Continue tube feed per dietitian.  Outpatient ENT follow-up

## 2021-01-25 NOTE — Progress Notes (Signed)
  Progress Note    Rebacca Moran   [id number]  DOB: 23-Oct-1955  DOA: 12/14/2020     42 Date of Service: 01/25/2021   Clinical Course  65yo with a history of ruptured intracranial aneurysm and subsequent SHD/SAH, bilateral true vocal cord paralysis, chronic tracheostomy and PEG, HTN, epilepsy, hypothyroidism, and VP shunt for recurrent hydrocephalus who presented from her SNF due to respiratory distress after her tracheostomy tube apparently became dislodged.   On arrival, she was emergently taken to the OR, intubated, and her tracheostomy tube was replaced by ENT, Dr. Pryor Ochoa after dilation of the existing fistula.  She has subsequently stabilized.  Has been ready for discharge to skilled nursing facility for many days now,  however no bed has been available.   10/26-10/30: Waiting for SNF.  Difficult placement due to trach and PEG    Assessment and Plan Bilateral vocal cord paralysis Currently NPO.  Continue tube feed per dietitian.  Outpatient ENT follow-up  Essential hypertension Well-controlled at this time.  Continue amlodipine via PEG  PEG (percutaneous endoscopic gastrostomy) status ([place]) Due to bilateral vocal cord dysfunction.  Speech therapy recommends n.p.o. status and continue tube feeding per RD  Tracheostomy status ([place]) S/p replacement of her chronic tracheostomy on admission.  Cuffed trach changed to uncuffed trach on 10/13 by ENT.  Outpatient follow-up with Dr. Pryor Ochoa from ENT in 1 month at discharge     Subjective:  Remains same  Objective Vitals:   01/25/21 0032 01/25/21 0438 01/25/21 0500 01/25/21 0856  BP: 104/69 106/68  119/77  Pulse: 76 69  70  Resp: 16 16  14   Temp: (!) 97.4 F (36.3 C) 97.6 F (36.4 C)  97.8 F (36.6 C)  TempSrc: Oral Oral    SpO2: 100% 100%  100%  Weight:   59.8 kg   Height:       59.8 kg  Vital signs were reviewed and unremarkable.   Exam Physical Exam   General exam: Awake and alert, no acute distress HEENT:  Trach in placeusingPMV,no visible secretions Respiratory system:lungs clear bilaterally,nowheezesrales or rhonci, trach collar 5 L/min supplemental O2 Cardiovascular system:Regular rate and rhythm, S1/S2+, no peripheral edema GI: PEG tube in place, abdomen soft nontender Neurologic: Grossly nonfocal exam, alert and oriented x3  Labs / Other Information There are no new results to review at this time.   Disposition Plan: Status is: Inpatient  Remains inpatient appropriate because: Pending SNF bed        Time spent: 15 minutes [facility] 01/25/2021, 10:45 AM

## 2021-01-25 NOTE — Assessment & Plan Note (Signed)
S/p replacement of her chronic tracheostomy on admission.  Cuffed trach changed to uncuffed trach on 10/13 by ENT.  Outpatient follow-up with Dr. Pryor Ochoa from ENT in 1 month at discharge

## 2021-01-25 NOTE — Assessment & Plan Note (Signed)
Well-controlled at this time.  Continue amlodipine via PEG

## 2021-01-25 NOTE — Assessment & Plan Note (Signed)
Due to bilateral vocal cord dysfunction.  Speech therapy recommends n.p.o. status and continue tube feeding per RD

## 2021-01-26 DIAGNOSIS — Z930 Tracheostomy status: Secondary | ICD-10-CM | POA: Diagnosis not present

## 2021-01-26 DIAGNOSIS — Z931 Gastrostomy status: Secondary | ICD-10-CM | POA: Diagnosis not present

## 2021-01-26 DIAGNOSIS — J3802 Paralysis of vocal cords and larynx, bilateral: Secondary | ICD-10-CM | POA: Diagnosis not present

## 2021-01-26 DIAGNOSIS — J9601 Acute respiratory failure with hypoxia: Secondary | ICD-10-CM | POA: Diagnosis not present

## 2021-01-26 LAB — GLUCOSE, CAPILLARY: Glucose-Capillary: 92 mg/dL (ref 70–99)

## 2021-01-26 NOTE — Progress Notes (Signed)
  Progress Note    Stacy Moran   [id number]  DOB: 1955-08-23  DOA: 12/14/2020     43 Date of Service: 01/26/2021   Clinical Course 65yo with a history of ruptured intracranial aneurysm and subsequent SHD/SAH, bilateral true vocal cord paralysis, chronic tracheostomy and PEG, HTN, epilepsy, hypothyroidism, and VP shunt for recurrent hydrocephalus who presented from her SNF due to respiratory distress after her tracheostomy tube apparently became dislodged.   On arrival, she was emergently taken to the OR, intubated, and her tracheostomy tube was replaced by ENT, Dr. Pryor Ochoa after dilation of the existing fistula.  She has subsequently stabilized.  Has been ready for discharge to skilled nursing facility for many days now,  however no bed has been available.   10/26-10/31: Waiting for SNF.  Difficult placement due to trach and PEG    Assessment and Plan Bilateral vocal cord paralysis Currently NPO.  Continue tube feed per dietitian.  Outpatient ENT follow-up.  Essential hypertension Well-controlled at this time.  Continue amlodipine via PEG  PEG (percutaneous endoscopic gastrostomy) status ([place]) Due to bilateral vocal cord dysfunction.  Speech therapy recommends n.p.o. status and continue tube feeding per RD  Acute respiratory failure [facility]) ENT emergently took the patient to the OR for replacement of her chronic tracheostomy at the time of presentation -has been educated on care of new trach by RT. Patient is stable from a respiratory standpoint  Follow-up with Dr. Pryor Ochoa from ENT in 1 month. Cuffed trach changed to uncuffed trach on 10/13. Difficult placement due to trach.  Dr. Pryor Ochoa recommends if it becomes dislodged, that it should be replaced w/ deflated cuffed tube to help prevent accidental removal.  Tracheostomy status [facility]) S/p replacement of her chronic tracheostomy on admission.  Cuffed trach changed to uncuffed trach on 10/13 by ENT.  Outpatient follow-up with Dr.  Pryor Ochoa from ENT in 1 month at discharge     Subjective:  Walking in the room.  Sharing frustration of being stuck here and not able to find placement  Objective Vitals:   01/26/21 0006 01/26/21 0738 01/26/21 0800 01/26/21 1008  BP: 110/78 127/72 127/79 (!) 143/79  Pulse: 74 80 87 97  Resp: 18 18 18    Temp: 98.3 F (36.8 C) 98.2 F (36.8 C) 98.2 F (36.8 C)   TempSrc: Oral Oral Oral   SpO2: 100% 100% 98%   Weight:      Height:       59.8 kg  Vital signs were reviewed and unremarkable.   Exam Physical Exam   General exam: Awake and alert, no acute distress HEENT: Trach in placeusingPMV,no visible secretions Respiratory system:lungs clear bilaterally,nowheezesrales or rhonci, trach collar 5 L/min supplemental O2 Cardiovascular system:Regular rate and rhythm, S1/S2+, no peripheral edema GI: PEG tube in place, abdomen soft nontender Neurologic: Grossly nonfocal exam, alert and oriented x3  Labs / Other Information There are no new results to review at this time.   Disposition Plan: Status is: Inpatient  Remains inpatient appropriate because: Waiting for SNF.  Difficult placement.  TOC working on it    Time spent: 15 minutes [facility] 01/26/2021, 10:33 AM

## 2021-01-26 NOTE — Progress Notes (Signed)
Pt requests trach suction. Secretary states she has already called respiratory to come do this.

## 2021-01-26 NOTE — Assessment & Plan Note (Signed)
Currently NPO.  Continue tube feed per dietitian.  Outpatient ENT follow-up.

## 2021-01-26 NOTE — Assessment & Plan Note (Signed)
Due to bilateral vocal cord dysfunction.  Speech therapy recommends n.p.o. status and continue tube feeding per RD

## 2021-01-26 NOTE — Assessment & Plan Note (Signed)
S/p replacement of her chronic tracheostomy on admission.  Cuffed trach changed to uncuffed trach on 10/13 by ENT.  Outpatient follow-up with Dr. Pryor Ochoa from ENT in 1 month at discharge

## 2021-01-26 NOTE — Progress Notes (Signed)
Patient states she had a small bowel movement this morning, normal in color but loose consistency. Gerald Stabs, RN notified.

## 2021-01-26 NOTE — TOC Progression Note (Signed)
Transition of Care [facility]) - Progression Note    Patient Details  Name: Marifer Hurd MRN: [id number] Date of Birth: [birth year]  Transition of Care [facility]) CM/SW [place], RN Phone Number: 01/26/2021, 4:06 PM  Clinical Narrative: Attempted to call [facility] to inquire about placement, according to Registration clerk they were in a meeting and will return call in an hour. When I returned call, was informed that she was in a conference meeting and to call back at 4:45pm. Will leave report for TOC to call tomorrow.      Expected Discharge Plan: [place] Barriers to Discharge: No SNF bed  Expected Discharge Plan and Services Expected Discharge Plan: [place]   Discharge Planning Services: CM Consult Post Acute Care Choice: [place] Living arrangements for the past 2 months: [place]                 DME Arranged: N/A DME Agency: NA       HH Arranged: NA HH Agency: NA         Social Determinants of Health (SDOH) Interventions    Readmission Risk Interventions Readmission Risk Prevention Plan 12/31/2020  Transportation Screening Complete  Medication Review Press photographer) Complete  PCP or Specialist appointment within 3-5 days of discharge Complete  HRI or Home Care Consult Complete  SW Recovery Care/Counseling Consult Complete  Palliative Care Screening Not [place] Complete

## 2021-01-26 NOTE — Plan of Care (Signed)

## 2021-01-26 NOTE — Assessment & Plan Note (Signed)
ENT emergently took the patient to the OR for replacement of her chronic tracheostomy at the time of presentation -has been educated on care of new trach by RT. Patient is stable from a respiratory standpoint  Follow-up with Dr. Pryor Ochoa from ENT in 1 month. Cuffed trach changed to uncuffed trach on 10/13. Difficult placement due to trach.  Dr. Pryor Ochoa recommends if it becomes dislodged, that it should be replaced w/ deflated cuffed tube to help prevent accidental removal.

## 2021-01-26 NOTE — Assessment & Plan Note (Signed)
Well-controlled at this time.  Continue amlodipine via PEG

## 2021-01-26 NOTE — Evaluation (Signed)
Physical Therapy Evaluation Patient Details Name: Stacy Moran MRN: [id number] DOB: [birth year] Today's Date: 01/26/2021  History of Present Illness  Pt is a 65 y/o female presented to the ED  from her SNF due to respiratory distress after her tracheostomy tube apparently became dislodged.  History of ruptured intracranial aneurysm and subsequent SHD/SAH, bilateral true vocal cord paralysis, chronic tracheostomy and PEG, HTN, epilepsy, hypothyroidism, and VP shunt for recurrent hydrocephalus, anxiety. Difficulty placement leading to prolonged hospital stay.   Clinical Impression  Patient alert, very motivated and pleasant with PT. Oriented x4 (but did think it was October 28th). The patient reported at her facility she was using a walker as needed. Further follow up assistance needed at baseline would be beneficial.  The patient demonstrated UE and LE strength WFLs, sensation and coordination intact. The patient was able to perform bed mobility modI and transfers with supervision for initial standing balance deficits. Higher level balance deficits assessed, some deficits noted and BERG to be performed next session for formal assessment. The patient was able to ambulate with varying AD/assist, did tend to reach for external support, some unsteadiness noted occasionally. Further ambulation deferred due to needing to coordinate with respiratory for trach collar attachments (pt on room air at the time, but did stay she normally walks with O2).  Overall the patient demonstrated excellent motivation to maximize function and would benefit from skilled PT intervention due to prolonged hospital stay and resultant deficits in endurance, balance, and mobility.      Recommendations for follow up therapy are one component of a multi-disciplinary discharge planning process, led by the attending physician.  Recommendations may be updated based on patient status, additional functional criteria and insurance  authorization.  Follow Up Recommendations Follow physician's recommendations for discharge plan and follow up therapies    Assistance Recommended at Discharge Intermittent Supervision/Assistance (for trach and peg safety)  Functional Status Assessment Patient has had a recent decline in their functional status and demonstrates the ability to make significant improvements in function in a reasonable and predictable amount of time.  Equipment Recommendations  None recommended by PT    Recommendations for Other Services       Precautions / Restrictions Precautions Precautions: Fall Restrictions Weight Bearing Restrictions: No      Mobility  Bed Mobility Overal bed mobility: Modified Independent                  Transfers Overall transfer level: Needs assistance Equipment used: Rolling walker (2 wheels) Transfers: Sit to/from Stand Sit to Stand: Supervision           General transfer comment: pt able to perform with and without walker, supervision for mild unsteadiness but no true physical assist needed    Ambulation/Gait Ambulation/Gait assistance: Supervision;Min guard Gait Distance (Feet): 50 Feet Assistive device: Rolling walker (2 wheels);None;1 person hand held assist       General Gait Details: Pt able to ambulate with RW, without RW, and with handheld assist. Does tend to reach for furniture/walls for external support. HR 132 max with ambulation, spO2 >90% throughout on room air.  Stairs            Wheelchair Mobility    Modified Rankin (Stroke Patients Only)       Balance Overall balance assessment: Mild deficits observed, not formally tested  Pertinent Vitals/Pain Pain Assessment: No/denies pain    Home Living Family/patient expects to be discharged to:: Skilled nursing facility                   Additional Comments: per chart and patient, has been at a facility.     Prior Function                       Hand Dominance   Dominant Hand: Right    Extremity/Trunk Assessment   Upper Extremity Assessment Upper Extremity Assessment: RUE deficits/detail;LUE deficits/detail RUE Deficits / Details: grossly 4+/5 RUE Sensation: WNL RUE Coordination: WNL LUE Deficits / Details: shoulder flexion limited to 90 degrees, able to perform apley ER to C7, IR to L1. flexion MMT 4/5 in available range, decreased L grip (3+/5) LUE Sensation: WNL LUE Coordination: WNL    Lower Extremity Assessment Lower Extremity Assessment: RLE deficits/detail;LLE deficits/detail RLE Deficits / Details: grossly 4+/5, 4/5 hip flexion RLE Coordination: WNL LLE Deficits / Details: grossly 4+/5, ankle DF 4/5, hip flexion 4/5 LLE Coordination: WNL    Cervical / Trunk Assessment Cervical / Trunk Assessment: Normal (PM on trach in place)  Communication   Communication: Tracheostomy;Passy-Muir valve  Cognition Arousal/Alertness: Awake/alert Behavior During Therapy: WFL for tasks assessed/performed                                   General Comments: oriented x4. pt very pleasant, jokes with PT        General Comments      Exercises Other Exercises Other Exercises: 5 times sit to stand test: 18.34 seconds without use of UEs   Assessment/Plan    PT Assessment Patient needs continued PT services  PT Problem List Decreased mobility;Decreased activity tolerance;Decreased balance;Cardiopulmonary status limiting activity       PT Treatment Interventions DME instruction;Therapeutic exercise;Gait training;Balance training;Stair training;Neuromuscular re-education;Functional mobility training;Therapeutic activities;Patient/family education    PT Goals (Current goals can be found in the Care Plan section)  Acute Rehab PT Goals Patient Stated Goal: to get her strength back PT Goal Formulation: With patient Time For Goal Achievement: 02/09/21 Potential to  Achieve Goals: Good Additional Goals Additional Goal #1: The patient will demonstrate a score of at least 41 to indicate low fall risk.    Frequency Min 2X/week   Barriers to discharge        Co-evaluation               AM-PAC PT "6 Clicks" Mobility  Outcome Measure Help needed turning from your back to your side while in a flat bed without using bedrails?: None Help needed moving from lying on your back to sitting on the side of a flat bed without using bedrails?: None Help needed moving to and from a bed to a chair (including a wheelchair)?: None Help needed standing up from a chair using your arms (e.g., wheelchair or bedside chair)?: None Help needed to walk in hospital room?: A Little Help needed climbing 3-5 steps with a railing? : A Little 6 Click Score: 22    End of Session Equipment Utilized During Treatment: Gait belt Activity Tolerance: Patient tolerated treatment well Patient left: in bed;with call bell/phone within reach Nurse Communication: Mobility status PT Visit Diagnosis: Other abnormalities of gait and mobility (R26.89);Muscle weakness (generalized) (M62.81)    Time: 0272-5366 PT Time Calculation (min) (ACUTE ONLY): 30 min   Charges:  PT Evaluation $PT Eval Moderate Complexity: 1 Mod PT Treatments $Therapeutic Exercise: 8-22 mins $Therapeutic Activity: 8-22 mins       Lieutenant Diego PT, DPT 2:12 PM,01/26/21

## 2021-01-27 DIAGNOSIS — I10 Essential (primary) hypertension: Secondary | ICD-10-CM | POA: Diagnosis not present

## 2021-01-27 DIAGNOSIS — J3802 Paralysis of vocal cords and larynx, bilateral: Secondary | ICD-10-CM | POA: Diagnosis not present

## 2021-01-27 DIAGNOSIS — J9601 Acute respiratory failure with hypoxia: Secondary | ICD-10-CM | POA: Diagnosis not present

## 2021-01-27 LAB — GLUCOSE, CAPILLARY: Glucose-Capillary: 92 mg/dL (ref 70–99)

## 2021-01-27 NOTE — Progress Notes (Signed)
Occupational Therapy Evaluation Patient Details Name: Stacy Moran MRN: [id number] DOB: [birth year] Today's Date: 01/27/2021   History of Present Illness Pt is a 66 y/o female presented to the ED  from her SNF due to respiratory distress after her tracheostomy tube apparently became dislodged.  History of ruptured intracranial aneurysm and subsequent SHD/SAH, bilateral true vocal cord paralysis, chronic tracheostomy and PEG, HTN, epilepsy, hypothyroidism, and VP shunt for recurrent hydrocephalus, anxiety. Difficulty placement leading to prolonged hospital stay.   Clinical Impression   Stacy Moran was seen for OT evaluation this date 2/2 pt request. Prior to hospital admission, pt needed assistance with IADLs but reported being independent in ADLs, and was residing in SNF. Currently pt demonstrates impairments as described below (See OT problem list) which functionally limit her ability to perform ADL/self-care tasks. Pt MOD I donning socks, seated EOB. For donning shoes, given extended time and MAX cueing unable to complete inserting heel in shoes, requiring MIN A although pt could physically complete. MIN A for UB dressing, despite MAX cues unable to recognize she had not completed donning jacket.  SBA + cues for functional mobility, ambulating ~ 20 feet bed to chair, 10 ft w/o RW, 10 ft w/ RW. Pt requires cues to navigate environment with RW.   The Orientation Log (O-Log) is designed to be a quick quantitative measure of orientational status for use at bedside with rehabilitation inpatients. Place, time, and situational (Etiology/Event + Pathology/Deficits) domains are assessed and can be tracked over time. Patient responses are scored based on spontaneous recall, logical cueing, multiple choice, and incorrect responses. Pt scored 21/30, unable to state reason for admission and current symptoms.   Pt completed Pill Box Test with a failing score. The Pill Box test assesses a pt's ability to accurately  follow common medication bottle instructions to fill a 1 week pill box. It assesses a pt's ability to plan, self-monitor, volition, and display executive function. Pt demonstrated deficits in executive function and self-monitoring. Pt completed only 3 rows of pills correctly in 13 minutes (time limit for test 5 min), incorrectly categorized one pill, and refused to complete the test.    Pt would benefit from skilled OT services to address noted impairments and functional limitations (see below for any additional details) in order to maximize safety and independence while minimizing falls risk and caregiver burden. Upon hospital discharge, recommend intermittent supervision.      Recommendations for follow up therapy are one component of a multi-disciplinary discharge planning process, led by the attending physician.  Recommendations may be updated based on patient status, additional functional criteria and insurance authorization.   Follow Up Recommendations  Follow physician's recommendations for discharge plan and follow up therapies    Assistance Recommended at Discharge Intermittent Supervision/Assistance  Functional Status Assessment  Patient has had a recent decline in their functional status and demonstrates the ability to make significant improvements in function in a reasonable and predictable amount of time.  Equipment Recommendations  BSC    Recommendations for Other Services       Precautions / Restrictions Precautions Precautions: Fall Restrictions Weight Bearing Restrictions: No      Mobility Bed Mobility Overal bed mobility: Modified Independent             General bed mobility comments: HOB elevated    Transfers Overall transfer level: Needs assistance Equipment used: Rolling walker (2 wheels) Transfers: Sit to/from Stand Sit to Stand: Supervision           General  transfer comment: pt initiated RW walker use after standing and walking ~ 10 ft into  bathroom to retrieve RW bc she did not like the one available in the room.      Balance Overall balance assessment: Needs assistance Sitting-balance support: Feet supported;No upper extremity supported Sitting balance-Leahy Scale: Normal     Standing balance support: No upper extremity supported;During functional activity Standing balance-Leahy Scale: Good                             ADL either performed or assessed with clinical judgement   ADL Overall ADL's : Needs assistance/impaired                                       General ADL Comments: MOD I for LBD, don socks and shoes, sitting EOB, extended time, did not complete inserting heel in shoes. MOD I for UB dressing, did not complete donning jacket. Intermittent SUP for functional mobility, ambulating ~ 20 feet bed to chair, 10 ft w/o RW, 10 ft w/ RW.     Vision         Perception     Praxis      Pertinent Vitals/Pain Pain Assessment: No/denies pain     Hand Dominance Right   Extremity/Trunk Assessment Upper Extremity Assessment Upper Extremity Assessment: Overall WFL for tasks assessed   Lower Extremity Assessment Lower Extremity Assessment: Overall WFL for tasks assessed       Communication Communication Communication: Tracheostomy;Passy-Muir valve   Cognition Arousal/Alertness: Awake/alert Behavior During Therapy: WFL for tasks assessed/performed Overall Cognitive Status: No family/caregiver present to determine baseline cognitive functioning                                 General Comments: O-log 21     General Comments       Exercises Exercises: Other exercises Other Exercises Other Exercises: Pt educ re: OT role, d/c recs, falls pcns, Other Exercises: Pt don socks/shoes, jacket, sup>sit, sit<>stand, ambu;ate ~ 20 ft, O log, pill box test, sitting in chair   Shoulder Instructions      Home Living Family/patient expects to be discharged to:: Skilled  nursing facility                                 Additional Comments: per chart and patient, has been at a facility.      Prior Functioning/Environment Prior Level of Function : Needs assist  Cognitive Assist : Mobility (cognitive);ADLs (cognitive)             ADLs Comments: pt previously at facility, requiring assist for IADLs, but reports being independent in dressing/bathing        OT Problem List: Decreased cognition;Decreased safety awareness;Decreased knowledge of use of DME or AE;Decreased knowledge of precautions      OT Treatment/Interventions: Self-care/ADL training;Therapeutic exercise;DME and/or AE instruction;Therapeutic activities;Energy conservation    OT Goals(Current goals can be found in the care plan section) Acute Rehab OT Goals Patient Stated Goal: to get a job OT Goal Formulation: With patient Time For Goal Achievement: 02/10/21 Potential to Achieve Goals: Fair ADL Goals Additional ADL Goal #1: Pt will remember and state 3/3 ECS for daily routines. Additional ADL Goal #2:  Pt will recongize when an ADL task has been started but not completed, and self-initiate completion. Additional ADL Goal #3: Pt will remember and demonstrate falls precautions when prior to and during ambulation in room.  OT Frequency: Min 1X/week   Barriers to D/C:            Co-evaluation              AM-PAC OT "6 Clicks" Daily Activity     Outcome Measure Help from another person eating meals?: None Help from another person taking care of personal grooming?: None Help from another person toileting, which includes using toliet, bedpan, or urinal?: A Little Help from another person bathing (including washing, rinsing, drying)?: A Little Help from another person to put on and taking off regular upper body clothing?: None Help from another person to put on and taking off regular lower body clothing?: None 6 Click Score: 22   End of Session Equipment  Utilized During Treatment: Rolling walker (2 wheels) Nurse Communication: Other (comment) (Notified nurse pt in chair and that pt is worried that nurse can not hear her when she calls)  Activity Tolerance: Patient tolerated treatment well Patient left: in chair;with call bell/phone within reach  OT Visit Diagnosis: Other symptoms and signs involving cognitive function (memory, safety, completion of tasks)                Time: 4854-6270 OT Time Calculation (min): 54 min Charges:  OT General Charges $OT Visit: 1 Visit OT Evaluation $OT Eval Moderate Complexity: 1 Mod OT Treatments $Self Care/Home Management : 38-52 mins  Nino Glow, Markus Daft 01/27/2021, 2:03 PM

## 2021-01-27 NOTE — Assessment & Plan Note (Signed)
Well-controlled at this time.  Continue amlodipine via PEG

## 2021-01-27 NOTE — Progress Notes (Signed)
  Progress Note    Stacy Moran   WUX:[id number]  DOB: 12/14/1955  DOA: 12/14/2020     46 Date of Service: 01/27/2021   Clinical Course  65yo with a history of ruptured intracranial aneurysm and subsequent SHD/SAH, bilateral true vocal cord paralysis, chronic tracheostomy and PEG, HTN, epilepsy, hypothyroidism, and VP shunt for recurrent hydrocephalus who presented from her SNF due to respiratory distress after her tracheostomy tube apparently became dislodged.   On arrival, she was emergently taken to the OR, intubated, and her tracheostomy tube was replaced by ENT, Dr. Pryor Ochoa after dilation of the existing fistula.  She has subsequently stabilized.  Has been ready for discharge to skilled nursing facility for many days now,  however no SNF bed has been available.   10/26-11/1: Waiting for SNF.  Difficult placement due to trach and PEG   Assessment and Plan Bilateral vocal cord paralysis Currently NPO.  Continue tube feed per dietitian.  Outpatient ENT follow-up.  Essential hypertension Well-controlled at this time.  Continue amlodipine via PEG  PEG (percutaneous endoscopic gastrostomy) status ([place]) Due to bilateral vocal cord dysfunction.  Speech therapy recommends n.p.o. status and continue tube feeding per RD.  Speech therapy reassessment requested today  Acute respiratory failure [facility]) ENT emergently took the patient to the OR for replacement of her chronic tracheostomy at the time of presentation -has been educated on care of new trach by RT. Patient is stable from a respiratory standpoint Follow-up with Dr. Pryor Ochoa from ENT in 1 month. Cuffed trach changed to uncuffed trach on 10/13. Difficult placement due to trach. Dr. Pryor Ochoa recommends if it becomes dislodged, that it should be replaced w/ deflated cuffed tube to help prevent accidental removal.  Tracheostomy status [facility]) S/p replacement of her chronic tracheostomy on admission.  Cuffed trach changed to uncuffed trach on 10/13  by ENT.  Outpatient follow-up with Dr. Pryor Ochoa from ENT in 1 month at discharge     Subjective:  Sitting in the bed working with therapy.  Frustrated off being here.  Hoping to go to SNF soon  Objective Vitals:   01/27/21 0500 01/27/21 0551 01/27/21 0915 01/27/21 1121  BP:  128/82 (!) 132/97 101/62  Pulse:  66 90 83  Resp:  16 18 20   Temp:  97.8 F (36.6 C) 97.9 F (36.6 C) 98.4 F (36.9 C)  TempSrc:  Oral    SpO2:  100% 98% 97%  Weight: 60.5 kg     Height:       60.5 kg  Vital signs were reviewed and unremarkable.   Exam Physical Exam   General exam: Awake and alert, no acute distress HEENT: Trach in placeusingPMV,no visible secretions Respiratory system:lungs clear bilaterally,nowheezesrales or rhonci, trach collar 5 L/min supplemental O2 Cardiovascular system:Regular rate and rhythm, S1/S2+, no peripheral edema GI: PEG tube in place, abdomen soft nontender Neurologic: Grossly nonfocal exam, alert and oriented x3  Labs / Other Information There are no new results to review at this time.   Disposition Plan: Status is: Inpatient  Remains inpatient appropriate because: Patient is medically stable.  Difficult placement due to trach and PEG.  Her family cannot take care of her at home as per patient   I have tried to call her daughter whose number is listed but have had no luck.  I have left message for her to call me back     Time spent: 15 minutes [facility] 01/27/2021, 2:19 PM

## 2021-01-27 NOTE — Assessment & Plan Note (Signed)
ENT emergently took the patient to the OR for replacement of her chronic tracheostomy at the time of presentation -has been educated on care of new trach by RT. Patient is stable from a respiratory standpoint Follow-up with Dr. Pryor Ochoa from ENT in 1 month. Cuffed trach changed to uncuffed trach on 10/13. Difficult placement due to trach. Dr. Pryor Ochoa recommends if it becomes dislodged, that it should be replaced w/ deflated cuffed tube to help prevent accidental removal.

## 2021-01-27 NOTE — TOC Progression Note (Addendum)
Transition of Care [facility]) - Progression Note    Patient Details  Name: Stacy Moran MRN: [id number] Date of Birth: 26-Sep-1955  Transition of Care [facility]) CM/SW [place], RN Phone Number: 01/27/2021, 10:31 AM  Clinical Narrative:   [facility] has no record of patient, RNCM re-sent referral, Rosanne Ashing will review and get back to [facility]  tOC to follow.  Addendum 1320:  Teressa at [facility] states that they cannot accept patient at this time.  Expected Discharge Plan: [place] Barriers to Discharge: No SNF bed  Expected Discharge Plan and Services Expected Discharge Plan: [place]   Discharge Planning Services: CM Consult Post Acute Care Choice: [place] Living arrangements for the past 2 months: [place]                 DME Arranged: N/A DME Agency: NA       HH Arranged: NA HH Agency: NA         Social Determinants of Health (SDOH) Interventions    Readmission Risk Interventions Readmission Risk Prevention Plan 12/31/2020  Transportation Screening Complete  Medication Review Press photographer) Complete  PCP or Specialist appointment within 3-5 days of discharge Complete  HRI or Home Care Consult Complete  SW Recovery Care/Counseling Consult Complete  Palliative Care Screening Not [place] Complete

## 2021-01-27 NOTE — Assessment & Plan Note (Signed)
Due to bilateral vocal cord dysfunction.  Speech therapy recommends n.p.o. status and continue tube feeding per RD.  Speech therapy reassessment requested today

## 2021-01-27 NOTE — Assessment & Plan Note (Signed)
Currently NPO.  Continue tube feed per dietitian.  Outpatient ENT follow-up.

## 2021-01-27 NOTE — Assessment & Plan Note (Signed)
S/p replacement of her chronic tracheostomy on admission.  Cuffed trach changed to uncuffed trach on 10/13 by ENT.  Outpatient follow-up with Dr. Pryor Ochoa from ENT in 1 month at discharge

## 2021-01-28 DIAGNOSIS — J3802 Paralysis of vocal cords and larynx, bilateral: Secondary | ICD-10-CM | POA: Diagnosis not present

## 2021-01-28 DIAGNOSIS — J9601 Acute respiratory failure with hypoxia: Secondary | ICD-10-CM | POA: Diagnosis not present

## 2021-01-28 DIAGNOSIS — Z930 Tracheostomy status: Secondary | ICD-10-CM | POA: Diagnosis not present

## 2021-01-28 LAB — GLUCOSE, CAPILLARY: Glucose-Capillary: 91 mg/dL (ref 70–99)

## 2021-01-28 NOTE — Progress Notes (Signed)
Nutrition Follow-up  DOCUMENTATION CODES:   Severe malnutrition in context of chronic illness  INTERVENTION:   Continue TF via PEG. 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  Ongoing  GOAL:   Patient will meet greater than or equal to 90% of their needs  Met with TF  MONITOR:   TF tolerance, Labs, Weight trends, Vent status, I & O's  REASON FOR ASSESSMENT:   Consult Enteral/tube feeding initiation and management  ASSESSMENT:   [age] female with PMH of vocal cord paralysis with chronic tracheostomy, HTN, hx subarachnoid hemorrhage, chronic PEG tube, anxiety, and ventriculoperitoneal shunt for recurrent hydrocephalus [facility]. Patient was found in respiratory distress in her room with a tracheostomy tube on the floor.  Reviewed I/O's: +355 ml x 24 hours and +8.4 L since 01/14/21  Pt remains medically stable for discharge. TOC continues to work to locate placement. She remains difficult to place PEG and trach.   She continues to tolerate TF well.    Medications reviewed and include keppra, miralax, and senokot.   Labs reviewed: CBGS: 77-111.  Diet Order:   Diet Order             Diet NPO time specified  Diet effective now                   EDUCATION NEEDS:   Education needs have been addressed  Skin:  Skin Assessment: Reviewed RN Assessment  Last BM:  01/26/21  Height:   Ht Readings from Last 1 Encounters:  12/14/20 5' 6"  (1.676 m)    Weight:   Wt Readings from Last 1 Encounters:  01/27/21 60.5 kg    Ideal Body Weight:  59.1 kg  BMI:  Body mass index  is 21.53 kg/m.  Estimated Nutritional Needs:   Kcal:  1800-2000 kcal  Protein:  90-100 g/d  Fluid:  1700-1900 mL/d    JenLoistine ChanceD, LDN, CDCPetersburggistered Dietitian II Certified Diabetes Care and Education Specialist Please refer to [facility] and/or RD on-call/weekend/after hours pager

## 2021-01-28 NOTE — Progress Notes (Signed)
Occupational Therapy Treatment Patient Details Name: Stacy Moran MRN: [id number] DOB: 1955-07-28 Today's Date: 01/28/2021   History of present illness Pt is a 65 y/o female presented to the ED  from her SNF due to respiratory distress after her tracheostomy tube apparently became dislodged.  History of ruptured intracranial aneurysm and subsequent SHD/SAH, bilateral true vocal cord paralysis, chronic tracheostomy and PEG, HTN, epilepsy, hypothyroidism, and VP shunt for recurrent hydrocephalus, anxiety. Difficulty placement leading to prolonged hospital stay.   OT comments  Pt. is on 5L O2 via trach. SO2: 96%, HR:72 bpms. Pt. education was provided about energy conservation, general reacher use, and A/E use for LE ADLs. Pt. Was able to demonstrate reacher, and sockaide use with cues.  Pt. continues to benefit from OT services for ADL training, A/E training, and pt. education about energy conservation/work simplification.   Recommendations for follow up therapy are one component of a multi-disciplinary discharge planning process, led by the attending physician.  Recommendations may be updated based on patient status, additional functional criteria and insurance authorization.    Follow Up Recommendations  Follow physician's recommendations for discharge plan and follow up therapies    Assistance Recommended at Discharge Intermittent Supervision/Assistance  Equipment Recommendations  [facility]    Recommendations for Other Services      Precautions / Restrictions Precautions Precautions: Fall       Mobility Bed Mobility    Independent                Transfers       Pt. Seen at bed level.                  Balance                                           ADL either performed or assessed with clinical judgement   ADL Overall ADL's : Needs assistance/impaired                                       General ADL Comments: Pt. edcuation  was provided about Energy conservation, general reacher use, and A/E for LE ADLs for energy conservation.     Vision       Perception     Praxis      Cognition Arousal/Alertness: Awake/alert Behavior During Therapy: WFL for tasks assessed/performed Overall Cognitive Status: No family/caregiver present to determine baseline cognitive functioning                                            Exercises     Shoulder Instructions       General Comments      Pertinent Vitals/ Pain       Pain Assessment: No/denies pain  Home Living                                          Prior Functioning/Environment              Frequency  Min 1X/week        Progress Toward Goals  OT Goals(current  goals can now be found in the care plan section)  Progress towards OT goals: Progressing toward goals  Acute Rehab OT Goals OT Goal Formulation: With patient Potential to Achieve Goals: Fair  Plan      Co-evaluation                 AM-PAC OT "6 Clicks" Daily Activity     Outcome Measure   Help from another person eating meals?: None Help from another person taking care of personal grooming?: None Help from another person toileting, which includes using toliet, bedpan, or urinal?: A Little Help from another person bathing (including washing, rinsing, drying)?: A Little Help from another person to put on and taking off regular upper body clothing?: None Help from another person to put on and taking off regular lower body clothing?: A Little 6 Click Score: 21    End of Session    OT Visit Diagnosis: Other symptoms and signs involving cognitive function   Activity Tolerance Patient tolerated treatment well   Patient Left in bed;with bed alarm set   Nurse Communication          Time: 9167-5612 OT Time Calculation (min): 24 min  Charges: OT General Charges $OT Visit: 1 Visit OT Treatments $Self Care/Home Management : 23-37  mins  Harrel Carina, MS, OTR/L  Harrel Carina 01/28/2021, 9:42 AM

## 2021-01-28 NOTE — Progress Notes (Signed)
Physical Therapy Treatment Patient Details Name: Stacy Moran MRN: [id number] DOB: [birth year] Today's Date: 01/28/2021   History of Present Illness Pt is a 65 y/o female presented to the ED  from her SNF due to respiratory distress after her tracheostomy tube apparently became dislodged.  History of ruptured intracranial aneurysm and subsequent SHD/SAH, bilateral true vocal cord paralysis, chronic tracheostomy and PEG, HTN, epilepsy, hypothyroidism, and VP shunt for recurrent hydrocephalus, anxiety. Difficulty placement leading to prolonged hospital stay.    PT Comments    Pt alert, sitting up in bed, enthusiastic for walking. Pt suctioned prior to mobility and tubing switched to portable O2 at 28% FiO2, 6L w/ SpO2 99% during amb. Pt requires MOD-I to SUPV for most mobility including (bed, transfers) but does require CGA when ambulating out of room. Pt had several bouts of noted instability w/ HHA and hand rail when walking around nurses station. Pt recovers with stepping or ankle strategy without physical assist. PT to continue working and assessing overall dynamic balance activities during future sessions. Skilled PT intervention is indicated to address deficits in function, mobility, and to return to PLOF as able.     Recommendations for follow up therapy are one component of a multi-disciplinary discharge planning process, led by the attending physician.  Recommendations may be updated based on patient status, additional functional criteria and insurance authorization.  Follow Up Recommendations  Follow physician's recommendations for discharge plan and follow up therapies     Assistance Recommended at Discharge Intermittent Supervision/Assistance  Equipment Recommendations  None recommended by PT    Recommendations for Other Services       Precautions / Restrictions Precautions Precautions: Fall Restrictions Weight Bearing Restrictions: No     Mobility  Bed Mobility Overal bed  mobility: Modified Independent             General bed mobility comments: HOB elevated    Transfers Overall transfer level: Needs assistance Equipment used: 1 person hand held assist Transfers: Sit to/from Stand Sit to Stand: Supervision           General transfer comment: Narrow BOS upon standing, very slight instability w/ SUPV for safety    Ambulation/Gait Ambulation/Gait assistance: Min guard Gait Distance (Feet): 180 Feet Assistive device: 1 person hand held assist Gait Pattern/deviations: Narrow base of support Gait velocity: Decreased   General Gait Details: Intermittent HHA w/ R arm to hand rail in hallway; several occurances of LOB w/ pt recovering with small stepping reaction, no physical support provided; SpO2 99% w/ amb 6L 28% FiO2   Stairs             Wheelchair Mobility    Modified Rankin (Stroke Patients Only)       Balance Overall balance assessment: Needs assistance Sitting-balance support: Feet supported;No upper extremity supported Sitting balance-Leahy Scale: Good     Standing balance support: No upper extremity supported;During functional activity Standing balance-Leahy Scale: Fair Standing balance comment: Does not require UE support for static short distances but several bouts of LOB without support during amb                            Cognition Arousal/Alertness: Awake/alert Behavior During Therapy: WFL for tasks assessed/performed Overall Cognitive Status: No family/caregiver present to determine baseline cognitive functioning  General Comments: pleasant and cooperative, follows one step commands        Exercises Other Exercises Other Exercises: Pt donned shoes w/ increased time for seated position    General Comments General comments (skin integrity, edema, etc.): trach 6L 28% FiO2      Pertinent Vitals/Pain Pain Assessment: No/denies pain    Home Living                           Prior Function            PT Goals (current goals can now be found in the care plan section) Progress towards PT goals: Progressing toward goals    Frequency    Min 2X/week      PT Plan Current plan remains appropriate    Co-evaluation              AM-PAC PT "6 Clicks" Mobility   Outcome Measure  Help needed turning from your back to your side while in a flat bed without using bedrails?: None Help needed moving from lying on your back to sitting on the side of a flat bed without using bedrails?: None Help needed moving to and from a bed to a chair (including a wheelchair)?: None Help needed standing up from a chair using your arms (e.g., wheelchair or bedside chair)?: None Help needed to walk in hospital room?: A Little Help needed climbing 3-5 steps with a railing? : A Little 6 Click Score: 22    End of Session Equipment Utilized During Treatment: Gait belt;Oxygen Activity Tolerance: Patient tolerated treatment well Patient left: in chair;with call bell/phone within reach Nurse Communication: Mobility status PT Visit Diagnosis: Other abnormalities of gait and mobility (R26.89);Muscle weakness (generalized) (M62.81)     Time: 0934-1000 PT Time Calculation (min) (ACUTE ONLY): 26 min  Charges:                       [employer], SPT

## 2021-01-28 NOTE — Progress Notes (Signed)
PROGRESS NOTE    Stacy Moran  JAS:[id number] DOB: [birth year] DOA: 12/14/2020 PCP: [name], MD    Brief Narrative:  [phone] with a history of ruptured intracranial aneurysm and subsequent SHD/SAH, bilateral true vocal cord paralysis, chronic tracheostomy and PEG, HTN, epilepsy, hypothyroidism, and VP shunt for recurrent hydrocephalus who presented from her SNF due to respiratory distress after her tracheostomy tube apparently became dislodged.   On arrival, she was emergently taken to the OR, intubated, and her tracheostomy tube was replaced by ENT, Dr. Pryor Ochoa after dilation of the existing fistula.  She has subsequently stabilized.  Has been ready for discharge to skilled nursing facility for many days now,  however no SNF bed has been available.    10/26-11/1: Waiting for SNF.  Difficult placement due to trach and PEG   Assessment & Plan:   Active Problems:   Tracheostomy status (HCC)   Acute respiratory failure (HCC)   PEG (percutaneous endoscopic gastrostomy) status ([place])   Essential hypertension   Bilateral vocal cord paralysis  Acute respiratory failure with hypoxemia. Status post tracheostomy and PEG. Bilateral vocal cord paralysis. Patient condition is a stable on oxygen over trach collar.  Secretion is controlled.  Currently not on any antibiotics.  N.p.o. and continue tube feeding.  Essential hypertension.  Mild leukopenia. Mild thrombocytopenia  Stable.  DVT prophylaxis: Lovenox Code Status: full Family Communication:  Disposition Plan:    Status is: Inpatient  Remains inpatient appropriate because: Unsafe discharge.        I/O last 3 completed shifts: In: 1065 [NG/GT:1065] Out: -  No intake/output data recorded.     Consultants:  None  Procedures:   Antimicrobials: None  Subjective: Patient condition has been stable, currently she still has a cough, denies any worsening short of breath. No abdominal pain or nausea vomiting. No  fever or chills.  Objective: Vitals:   01/27/21 2050 01/28/21 0520 01/28/21 0825 01/28/21 0938  BP: 132/76 106/68 124/75 125/77  Pulse: 77 67 64 79  Resp: 16 16 16    Temp: 98.7 F (37.1 C)  98.6 F (37 C)   TempSrc:   Axillary   SpO2: 100% 95% 100%   Weight:      Height:        Intake/Output Summary (Last 24 hours) at 01/28/2021 1030 Last data filed at 01/27/2021 2149 Gross per 24 hour  Intake 355 ml  Output --  Net 355 ml   Filed Weights   01/24/21 0500 01/25/21 0500 01/27/21 0500  Weight: 58.8 kg 59.8 kg 60.5 kg    Examination:  General exam: Appears calm and comfortable  Respiratory system: Coarse breathing sounds. Respiratory effort normal. Cardiovascular system: S1 & S2 heard, RRR. No JVD, murmurs, rubs, gallops or clicks. No pedal edema. Gastrointestinal system: Abdomen is nondistended, soft and nontender. No organomegaly or masses felt. Normal bowel sounds heard. Central nervous system: Alert and oriented. No focal neurological deficits. Extremities: Symmetric 5 x 5 power. Skin: No rashes, lesions or ulcers Psychiatry: Judgement and insight appear normal. Mood & affect appropriate.     Data Reviewed: I have personally reviewed following labs and imaging studies  CBC: Recent Labs  Lab 01/24/21 0554  WBC 3.8*  HGB 10.6*  HCT 31.8*  MCV 82.8  PLT 790*   Basic Metabolic Panel: Recent Labs  Lab 01/24/21 0554  CREATININE 0.48   GFR: Estimated Creatinine Clearance: 67.4 mL/min (by C-G formula based on SCr of 0.48 mg/dL). Liver Function Tests: No results for input(s): AST,  ALT, ALKPHOS, BILITOT, PROT, ALBUMIN in the last 168 hours. No results for input(s): LIPASE, AMYLASE in the last 168 hours. No results for input(s): AMMONIA in the last 168 hours. Coagulation Profile: No results for input(s): INR, PROTIME in the last 168 hours. Cardiac Enzymes: No results for input(s): CKTOTAL, CKMB, CKMBINDEX, TROPONINI in the last 168 hours. BNP (last 3  results) No results for input(s): PROBNP in the last 8760 hours. HbA1C: No results for input(s): HGBA1C in the last 72 hours. CBG: Recent Labs  Lab 01/24/21 0645 01/25/21 0435 01/26/21 0553 01/27/21 0552 01/28/21 0518  GLUCAP 111* 77 92 92 91   Lipid Profile: No results for input(s): CHOL, HDL, LDLCALC, TRIG, CHOLHDL, LDLDIRECT in the last 72 hours. Thyroid Function Tests: No results for input(s): TSH, T4TOTAL, FREET4, T3FREE, THYROIDAB in the last 72 hours. Anemia Panel: No results for input(s): VITAMINB12, FOLATE, FERRITIN, TIBC, IRON, RETICCTPCT in the last 72 hours. Sepsis Labs: No results for input(s): PROCALCITON, LATICACIDVEN in the last 168 hours.  No results found for this or any previous visit (from the past 240 hour(s)).       Radiology Studies: No results found.      Scheduled Meds:  amLODipine  5 mg Per Tube Daily   Chlorhexidine Gluconate Cloth  6 each Topical Daily   enoxaparin (LOVENOX) injection  40 mg Subcutaneous Q24H   feeding supplement (OSMOLITE 1.2 CAL)  355 mL Per Tube 5 X Daily   free water  150 mL Per Tube Q6H   guaiFENesin  10 mL Per Tube Q8H   levETIRAcetam  750 mg Per Tube BID   levothyroxine  100 mcg Per Tube Q0600   polyethylene glycol  17 g Per Tube BID   QUEtiapine  50 mg Per Tube BID   senna  1 tablet Per Tube Daily   Continuous Infusions:   LOS: 45 days    Time spent: 22 minutes    Sharen Hones, MD [facility]   To contact the attending provider between 7A-7P or the covering provider during after hours 7P-7A, please log into the web site [email or web address] and access using universal [place] password for that web site. If you do not have the password, please call the hospital operator.  01/28/2021, 10:30 AM

## 2021-01-29 DIAGNOSIS — J9601 Acute respiratory failure with hypoxia: Secondary | ICD-10-CM | POA: Diagnosis not present

## 2021-01-29 DIAGNOSIS — J3802 Paralysis of vocal cords and larynx, bilateral: Secondary | ICD-10-CM | POA: Diagnosis not present

## 2021-01-29 LAB — GLUCOSE, CAPILLARY: Glucose-Capillary: 83 mg/dL (ref 70–99)

## 2021-01-29 NOTE — Progress Notes (Signed)
PROGRESS NOTE    Stacy Moran  UTM:[id number] DOB: 1956/01/23 DOA: 12/14/2020 PCP: [name], MD    Brief Narrative:  [phone] with a history of ruptured intracranial aneurysm and subsequent SHD/SAH, bilateral true vocal cord paralysis, chronic tracheostomy and PEG, HTN, epilepsy, hypothyroidism, and VP shunt for recurrent hydrocephalus who presented from her SNF due to respiratory distress after her tracheostomy tube apparently became dislodged.   On arrival, she was emergently taken to the OR, intubated, and her tracheostomy tube was replaced by ENT, Dr. Pryor Ochoa after dilation of the existing fistula.  She has subsequently stabilized.  Has been ready for discharge to skilled nursing facility for many days now,  however no SNF bed has been available.    10/26-11/1: Waiting for SNF.  Difficult placement due to trach and PEG   Assessment & Plan:   Active Problems:   Tracheostomy status (HCC)   Acute respiratory failure (HCC)   PEG (percutaneous endoscopic gastrostomy) status ([place])   Essential hypertension   Bilateral vocal cord paralysis    Acute respiratory failure with hypoxemia. Status post tracheostomy and PEG. Bilateral vocal cord paralysis. Patient still has airway secretion requiring suctioning.  Otherwise doing well.  Tolerating tube feeding.  Mild leukopenia. Mild thrombocytopenia  Stable.   DVT prophylaxis: Lovenox Code Status: full Family Communication:  Disposition Plan:      Status is: Inpatient   Remains inpatient appropriate because: Unsafe discharge.      I/O last 3 completed shifts: In: 355 [NG/GT:355] Out: -  No intake/output data recorded.       Subjective: Patient doing well today, she has some airway secretion, but easily suctioned.  No worsening short of breath, still on oxygen over trach collar. No diarrhea, good appetite without nausea vomiting  No fever or chills.  Objective: Vitals:   01/28/21 2023 01/28/21 2348 01/29/21  0513 01/29/21 0740  BP: 131/85 (!) 145/88 (!) 154/86 (!) 163/96  Pulse: 87 85 80 100  Resp: 16 16 16 18   Temp: 98.1 F (36.7 C) 98.1 F (36.7 C) 98.1 F (36.7 C) 98.5 F (36.9 C)  TempSrc:  Oral    SpO2: 96% 99% 98% 94%  Weight:      Height:       No intake or output data in the 24 hours ending 01/29/21 1108 Filed Weights   01/24/21 0500 01/25/21 0500 01/27/21 0500  Weight: 58.8 kg 59.8 kg 60.5 kg    Examination:  General exam: Appears calm and comfortable  Respiratory system: Clear to auscultation. Respiratory effort normal. Cardiovascular system: S1 & S2 heard, RRR. No JVD, murmurs, rubs, gallops or clicks. No pedal edema. Gastrointestinal system: Abdomen is nondistended, soft and nontender. No organomegaly or masses felt. Normal bowel sounds heard. Central nervous system: Alert and oriented. No focal neurological deficits. Extremities: Symmetric 5 x 5 power. Skin: No rashes, lesions or ulcers Psychiatry: Judgement and insight appear normal. Mood & affect appropriate.     Data Reviewed: I have personally reviewed following labs and imaging studies  CBC: Recent Labs  Lab 01/24/21 0554  WBC 3.8*  HGB 10.6*  HCT 31.8*  MCV 82.8  PLT 275*   Basic Metabolic Panel: Recent Labs  Lab 01/24/21 0554  CREATININE 0.48   GFR: Estimated Creatinine Clearance: 67.4 mL/min (by C-G formula based on SCr of 0.48 mg/dL). Liver Function Tests: No results for input(s): AST, ALT, ALKPHOS, BILITOT, PROT, ALBUMIN in the last 168 hours. No results for input(s): LIPASE, AMYLASE in the last 168  hours. No results for input(s): AMMONIA in the last 168 hours. Coagulation Profile: No results for input(s): INR, PROTIME in the last 168 hours. Cardiac Enzymes: No results for input(s): CKTOTAL, CKMB, CKMBINDEX, TROPONINI in the last 168 hours. BNP (last 3 results) No results for input(s): PROBNP in the last 8760 hours. HbA1C: No results for input(s): HGBA1C in the last 72  hours. CBG: Recent Labs  Lab 01/25/21 0435 01/26/21 0553 01/27/21 0552 01/28/21 0518 01/29/21 0508  GLUCAP 77 92 92 91 83   Lipid Profile: No results for input(s): CHOL, HDL, LDLCALC, TRIG, CHOLHDL, LDLDIRECT in the last 72 hours. Thyroid Function Tests: No results for input(s): TSH, T4TOTAL, FREET4, T3FREE, THYROIDAB in the last 72 hours. Anemia Panel: No results for input(s): VITAMINB12, FOLATE, FERRITIN, TIBC, IRON, RETICCTPCT in the last 72 hours. Sepsis Labs: No results for input(s): PROCALCITON, LATICACIDVEN in the last 168 hours.  No results found for this or any previous visit (from the past 240 hour(s)).       Radiology Studies: No results found.      Scheduled Meds:  amLODipine  5 mg Per Tube Daily   Chlorhexidine Gluconate Cloth  6 each Topical Daily   enoxaparin (LOVENOX) injection  40 mg Subcutaneous Q24H   feeding supplement (OSMOLITE 1.2 CAL)  355 mL Per Tube 5 X Daily   free water  150 mL Per Tube Q6H   guaiFENesin  10 mL Per Tube Q8H   levETIRAcetam  750 mg Per Tube BID   levothyroxine  100 mcg Per Tube Q0600   polyethylene glycol  17 g Per Tube BID   QUEtiapine  50 mg Per Tube BID   senna  1 tablet Per Tube Daily   Continuous Infusions:   LOS: 46 days    Time spent: 23 minutes    Sharen Hones, MD [facility]   To contact the attending provider between 7A-7P or the covering provider during after hours 7P-7A, please log into the web site [email or web address] and access using universal [place] password for that web site. If you do not have the password, please call the hospital operator.  01/29/2021, 11:08 AM

## 2021-01-29 NOTE — Progress Notes (Signed)
PT Cancellation Note  Patient Details Name: Stacy Moran MRN: [id number] DOB: 08-28-1955   Cancelled Treatment:    Reason Eval/Treat Not Completed: Other (comment). Pt sleeping soundly, PT to re-attempt as able.  Lieutenant Diego PT, DPT 10:30 AM,01/29/21

## 2021-01-30 DIAGNOSIS — J9601 Acute respiratory failure with hypoxia: Secondary | ICD-10-CM | POA: Diagnosis not present

## 2021-01-30 DIAGNOSIS — I10 Essential (primary) hypertension: Secondary | ICD-10-CM | POA: Diagnosis not present

## 2021-01-30 DIAGNOSIS — J3802 Paralysis of vocal cords and larynx, bilateral: Secondary | ICD-10-CM | POA: Diagnosis not present

## 2021-01-30 LAB — GLUCOSE, CAPILLARY: Glucose-Capillary: 82 mg/dL (ref 70–99)

## 2021-01-30 NOTE — Progress Notes (Signed)
Occupational Therapy Treatment Patient Details Name: Stacy Moran MRN: [id number] DOB: 03/25/1956 Today's Date: 01/30/2021   History of present illness Pt is a 65 y/o female presented to the ED  from her SNF due to respiratory distress after her tracheostomy tube apparently became dislodged.  History of ruptured intracranial aneurysm and subsequent SHD/SAH, bilateral true vocal cord paralysis, chronic tracheostomy and PEG, HTN, epilepsy, hypothyroidism, and VP shunt for recurrent hydrocephalus, anxiety. Difficulty placement leading to prolonged hospital stay.   OT comments  Upon entering the room, pt supine in bed with no c/o pain. Pt dons PMSV herself and wears throughout session without distress. Pt performs bed mobility with HOB elevated but no physical assistance needed. Pt remains on RA and ambulates with RW to sink with min guard. Pt standing for ~ 10 minutes with min A secondary to posterior bias with fatigue to brush teeth and wash face. Pt utilizing some modifications of her own for increased independence to open containers and utilize toothbrush. Pt returning to sit on EOB at end of session with O2 at 93%. All needs within reach and pt resting secondary to reported fatigue. Bed alarm activated for safety.    Recommendations for follow up therapy are one component of a multi-disciplinary discharge planning process, led by the attending physician.  Recommendations may be updated based on patient status, additional functional criteria and insurance authorization.    Follow Up Recommendations  Follow physician's recommendations for discharge plan and follow up therapies    Assistance Recommended at Discharge Intermittent Supervision/Assistance  Equipment Recommendations  BSC       Precautions / Restrictions Precautions Precautions: Fall Restrictions Weight Bearing Restrictions: No       Mobility Bed Mobility Overal bed mobility: Modified Independent             General bed  mobility comments: no physical assist    Transfers Overall transfer level: Needs assistance Equipment used: Rolling walker (2 wheels) Transfers: Sit to/from Stand;Stand Pivot Transfers Sit to Stand: Supervision Stand pivot transfers: Min guard         General transfer comment: cuing for safety with mobility     Balance Overall balance assessment: Needs assistance Sitting-balance support: Feet supported;No upper extremity supported Sitting balance-Leahy Scale: Good     Standing balance support: No upper extremity supported;During functional activity;Reliant on assistive device for balance Standing balance-Leahy Scale: Fair                   Standardized Balance Assessment Standardized Balance Assessment : Berg Balance Test Berg Balance Test Sit to Stand: Able to stand without using hands and stabilize independently Standing Unsupported: Able to stand 2 minutes with supervision Sitting with Back Unsupported but Feet Supported on Floor or Stool: Able to sit safely and securely 2 minutes Stand to Sit: Sits safely with minimal use of hands Transfers: Able to transfer safely, minor use of hands Standing Unsupported with Eyes Closed: Able to stand 10 seconds with supervision Standing Ubsupported with Feet Together: Able to place feet together independently and stand 1 minute safely From Standing, Reach Forward with Outstretched Arm: Can reach forward >12 cm safely (5") From Standing Position, Pick up Object from Floor: Able to pick up shoe, needs supervision From Standing Position, Turn to Look Behind Over each Shoulder: Looks behind one side only/other side shows less weight shift Turn 360 Degrees: Able to turn 360 degrees safely in 4 seconds or less Standing Unsupported, Alternately Place Feet on Step/Stool: Able to stand independently  and complete 8 steps >20 seconds Standing Unsupported, One Foot in Front: Able to place foot tandem independently and hold 30  seconds Standing on One Leg: Able to lift leg independently and hold 5-10 seconds Total Score: 49       ADL either performed or assessed with clinical judgement   ADL Overall ADL's : Needs assistance/impaired     Grooming: Wash/dry hands;Wash/dry face;Oral care;Supervision/safety Grooming Details (indicate cue type and reason): min A for standing balance at sink with posterior bias                                     Vision Patient Visual Report: No change from baseline            Cognition Arousal/Alertness: Awake/alert Behavior During Therapy: WFL for tasks assessed/performed Overall Cognitive Status: No family/caregiver present to determine baseline cognitive functioning                                 General Comments: pleasant and cooperative, follows one step commands                     Pertinent Vitals/ Pain       Pain Assessment: No/denies pain         Frequency  Min 2X/week        Progress Toward Goals  OT Goals(current goals can now be found in the care plan section)  Progress towards OT goals: Progressing toward goals  Acute Rehab OT Goals Patient Stated Goal: to brush teeth OT Goal Formulation: With patient Time For Goal Achievement: 02/10/21 Potential to Achieve Goals: [place] Discharge plan remains appropriate;Frequency needs to be updated       AM-PAC OT "6 Clicks" Daily Activity     Outcome Measure   Help from another person eating meals?: None Help from another person taking care of personal grooming?: None Help from another person toileting, which includes using toliet, bedpan, or urinal?: A Little Help from another person bathing (including washing, rinsing, drying)?: A Little Help from another person to put on and taking off regular upper body clothing?: None Help from another person to put on and taking off regular lower body clothing?: A Little 6 Click Score: 21    End of Session  Equipment Utilized During Treatment: Rolling walker (2 wheels)  OT Visit Diagnosis: Other symptoms and signs involving cognitive function   Activity Tolerance Patient tolerated treatment well   Patient Left in bed;with bed alarm set   Nurse Communication Other (comment)        Time: 6073-7106 OT Time Calculation (min): 23 min  Charges: OT General Charges $OT Visit: 1 Visit OT Treatments $Self Care/Home Management : 23-37 mins  Darleen Crocker, MS, OTR/L , CBIS ascom [phone]  01/30/21, 4:23 PM

## 2021-01-30 NOTE — Progress Notes (Signed)
PROGRESS NOTE    Stacy Moran  LHT:[id number] DOB: 09-24-1955 DOA: 12/14/2020 PCP: [name], MD    Brief Narrative:  [phone] with a history of ruptured intracranial aneurysm and subsequent SHD/SAH, bilateral true vocal cord paralysis, chronic tracheostomy and PEG, HTN, epilepsy, hypothyroidism, and VP shunt for recurrent hydrocephalus who presented from her SNF due to respiratory distress after her tracheostomy tube apparently became dislodged.   On arrival, she was emergently taken to the OR, intubated, and her tracheostomy tube was replaced by ENT, Dr. Pryor Ochoa after dilation of the existing fistula.  She has subsequently stabilized.  Has been ready for discharge to skilled nursing facility for many days now,  however no SNF bed has been available.    10/26-11/1: Waiting for SNF.  Difficult placement due to trach and PEG   Assessment & Plan:   Active Problems:   Tracheostomy status (HCC)   Acute respiratory failure (HCC)   PEG (percutaneous endoscopic gastrostomy) status ([place])   Essential hypertension   Bilateral vocal cord paralysis   Acute respiratory failure with hypoxemia. Status post tracheostomy and PEG. Bilateral vocal cord paralysis. Doing well, tolerating tube feeding.  Airway secretion and controlled.  Continue oxygen treatment.  Mild leukopenia. Mild thrombocytopenia  Recheck a CBC tomorrow.   DVT prophylaxis: Lovenox Code Status: full Family Communication:  Disposition Plan:      Status is: Inpatient   Remains inpatient appropriate because: Unsafe discharge.       I/O last 3 completed shifts: In: 505 [Other:505] Out: -  No intake/output data recorded.     Subjective: Patient doing well today.  She slept last night.  Did not feel any short of breath.  No segment short of breath.  Airway secretions better. No abdominal pain or nausea vomiting.  Objective: Vitals:   01/30/21 0721 01/30/21 0955 01/30/21 1021 01/30/21 1131  BP: 107/67  (!)  167/67 106/65  Pulse: 80  98 85  Resp: 14   18  Temp: 97.9 F (36.6 C)   (!) 97.4 F (36.3 C)  TempSrc: Oral   Oral  SpO2: 97% 94%  98%  Weight:      Height:        Intake/Output Summary (Last 24 hours) at 01/30/2021 1305 Last data filed at 01/29/2021 2310 Gross per 24 hour  Intake 505 ml  Output --  Net 505 ml   Filed Weights   01/24/21 0500 01/25/21 0500 01/27/21 0500  Weight: 58.8 kg 59.8 kg 60.5 kg    Examination:  General exam: Appears calm and comfortable  Respiratory system: Clear to auscultation. Respiratory effort normal. Cardiovascular system: S1 & S2 heard, RRR. No JVD, murmurs, rubs, gallops or clicks. No pedal edema. Gastrointestinal system: Abdomen is nondistended, soft and nontender. No organomegaly or masses felt. Normal bowel sounds heard. Central nervous system: Alert and oriented. No focal neurological deficits. Extremities: Symmetric 5 x 5 power. Skin: No rashes, lesions or ulcers Psychiatry: Judgement and insight appear normal. Mood & affect appropriate.     Data Reviewed: I have personally reviewed following labs and imaging studies  CBC: Recent Labs  Lab 01/24/21 0554  WBC 3.8*  HGB 10.6*  HCT 31.8*  MCV 82.8  PLT 203*   Basic Metabolic Panel: Recent Labs  Lab 01/24/21 0554  CREATININE 0.48   GFR: Estimated Creatinine Clearance: 67.4 mL/min (by C-G formula based on SCr of 0.48 mg/dL). Liver Function Tests: No results for input(s): AST, ALT, ALKPHOS, BILITOT, PROT, ALBUMIN in the last 168 hours.  No results for input(s): LIPASE, AMYLASE in the last 168 hours. No results for input(s): AMMONIA in the last 168 hours. Coagulation Profile: No results for input(s): INR, PROTIME in the last 168 hours. Cardiac Enzymes: No results for input(s): CKTOTAL, CKMB, CKMBINDEX, TROPONINI in the last 168 hours. BNP (last 3 results) No results for input(s): PROBNP in the last 8760 hours. HbA1C: No results for input(s): HGBA1C in the last 72  hours. CBG: Recent Labs  Lab 01/26/21 0553 01/27/21 0552 01/28/21 0518 01/29/21 0508 01/30/21 0500  GLUCAP 92 92 91 83 82   Lipid Profile: No results for input(s): CHOL, HDL, LDLCALC, TRIG, CHOLHDL, LDLDIRECT in the last 72 hours. Thyroid Function Tests: No results for input(s): TSH, T4TOTAL, FREET4, T3FREE, THYROIDAB in the last 72 hours. Anemia Panel: No results for input(s): VITAMINB12, FOLATE, FERRITIN, TIBC, IRON, RETICCTPCT in the last 72 hours. Sepsis Labs: No results for input(s): PROCALCITON, LATICACIDVEN in the last 168 hours.  No results found for this or any previous visit (from the past 240 hour(s)).       Radiology Studies: No results found.      Scheduled Meds:  amLODipine  5 mg Per Tube Daily   Chlorhexidine Gluconate Cloth  6 each Topical Daily   enoxaparin (LOVENOX) injection  40 mg Subcutaneous Q24H   feeding supplement (OSMOLITE 1.2 CAL)  355 mL Per Tube 5 X Daily   free water  150 mL Per Tube Q6H   guaiFENesin  10 mL Per Tube Q8H   levETIRAcetam  750 mg Per Tube BID   levothyroxine  100 mcg Per Tube Q0600   polyethylene glycol  17 g Per Tube BID   QUEtiapine  50 mg Per Tube BID   senna  1 tablet Per Tube Daily   Continuous Infusions:   LOS: 47 days    Time spent: 20 minutes    Sharen Hones, MD [facility]   To contact the attending provider between 7A-7P or the covering provider during after hours 7P-7A, please log into the web site [email or web address] and access using universal [place] password for that web site. If you do not have the password, please call the hospital operator.  01/30/2021, 1:05 PM

## 2021-01-30 NOTE — Progress Notes (Signed)
Physical Therapy Treatment Patient Details Name: Stacy Moran MRN: [id number] DOB: 16-Aug-1955 Today's Date: 01/30/2021   History of Present Illness Pt is a 65 y/o female presented to the ED  from her SNF due to respiratory distress after her tracheostomy tube apparently became dislodged.  History of ruptured intracranial aneurysm and subsequent SHD/SAH, bilateral true vocal cord paralysis, chronic tracheostomy and PEG, HTN, epilepsy, hypothyroidism, and VP shunt for recurrent hydrocephalus, anxiety. Difficulty placement leading to prolonged hospital stay.    PT Comments    Patient easily wakes, per OT eager for ambulation at end of OT session. Session limited this AM due to RN interruption, PT returned for ambulation. The patient demonstrated bed mobility and transfers modI. minA to don shoes while sitting, though not required. She ambulated ~13ft with RW and CGA/supervision, improved safety, balance, and activity tolerance noted. Returned to room, HR 119 and spO2 95% on 2L via [place] after mobility. The patient would benefit from further skilled PT intervention to continue to progress towards goals. Recommendation remains appropriate.     Recommendations for follow up therapy are one component of a multi-disciplinary discharge planning process, led by the attending physician.  Recommendations may be updated based on patient status, additional functional criteria and insurance authorization.  Follow Up Recommendations  Follow physician's recommendations for discharge plan and follow up therapies     Assistance Recommended at Discharge Intermittent Supervision/Assistance  Equipment Recommendations  None recommended by PT    Recommendations for Other Services       Precautions / Restrictions Precautions Precautions: Fall Restrictions Weight Bearing Restrictions: No     Mobility  Bed Mobility Overal bed mobility: Modified Independent             General bed mobility comments: no  physical assist    Transfers Overall transfer level: Needs assistance Equipment used: Rolling walker (2 wheels) Transfers: Sit to/from Stand;Stand Pivot Transfers Sit to Stand: Supervision Stand pivot transfers: Min guard         General transfer comment: assist to don shoes, coat    Ambulation/Gait Ambulation/Gait assistance: Min guard;Supervision Gait Distance (Feet): 180 Feet Assistive device: Rolling walker (2 wheels) Gait Pattern/deviations: Narrow base of support Gait velocity: Decreased   General Gait Details: improved safety noted with RW, on 2L via [place] throughout   Stairs             Wheelchair Mobility    Modified Rankin (Stroke Patients Only)       Balance Overall balance assessment: Needs assistance Sitting-balance support: Feet supported;No upper extremity supported Sitting balance-Leahy Scale: Good     Standing balance support: No upper extremity supported;During functional activity;Reliant on assistive device for balance Standing balance-Leahy Scale: Good Standing balance comment: more comfortable/safe with BUE support                 Standardized Balance Assessment Standardized Balance Assessment : Berg Balance Test Berg Balance Test Sit to Stand: Able to stand without using hands and stabilize independently Standing Unsupported: Able to stand 2 minutes with supervision Sitting with Back Unsupported but Feet Supported on Floor or Stool: Able to sit safely and securely 2 minutes Stand to Sit: Sits safely with minimal use of hands Transfers: Able to transfer safely, minor use of hands Standing Unsupported with Eyes Closed: Able to stand 10 seconds with supervision Standing Ubsupported with Feet Together: Able to place feet together independently and stand 1 minute safely From Standing, Reach Forward with Outstretched Arm: Can reach forward >12 cm  safely (5") From Standing Position, Pick up Object from Floor: Able to pick up shoe, needs  supervision From Standing Position, Turn to Look Behind Over each Shoulder: Looks behind one side only/other side shows less weight shift Turn 360 Degrees: Able to turn 360 degrees safely in 4 seconds or less Standing Unsupported, Alternately Place Feet on Step/Stool: Able to stand independently and complete 8 steps >20 seconds Standing Unsupported, One Foot in Front: Able to place foot tandem independently and hold 30 seconds Standing on One Leg: Able to lift leg independently and hold 5-10 seconds Total Score: 49        Cognition Arousal/Alertness: Awake/alert Behavior During Therapy: WFL for tasks assessed/performed Overall Cognitive Status: No family/caregiver present to determine baseline cognitive functioning                                 General Comments: pleasant and cooperative, follows one step commands        Exercises      General Comments        Pertinent Vitals/Pain Pain Assessment: No/denies pain    Home Living                          Prior Function            PT Goals (current goals can now be found in the care plan section) Progress towards PT goals: Progressing toward goals    Frequency    Min 2X/week      PT Plan Current plan remains appropriate    Co-evaluation              AM-PAC PT "6 Clicks" Mobility   Outcome Measure  Help needed turning from your back to your side while in a flat bed without using bedrails?: None Help needed moving from lying on your back to sitting on the side of a flat bed without using bedrails?: None Help needed moving to and from a bed to a chair (including a wheelchair)?: None Help needed standing up from a chair using your arms (e.g., wheelchair or bedside chair)?: None Help needed to walk in hospital room?: A Little Help needed climbing 3-5 steps with a railing? : A Lot 6 Click Score: 21    End of Session Equipment Utilized During Treatment: Gait belt;Oxygen Activity  Tolerance: Patient tolerated treatment well Patient left: in bed;with call bell/phone within reach;with nursing/sitter in room Nurse Communication: Mobility status PT Visit Diagnosis: Other abnormalities of gait and mobility (R26.89);Muscle weakness (generalized) (M62.81)     Time: 1435-1450 PT Time Calculation (min) (ACUTE ONLY): 15 min  Charges:  $Therapeutic Exercise: 8-22 mins $Therapeutic Activity: 8-22 mins $Neuromuscular Re-education: 8-22 mins                     Lieutenant Diego PT, DPT 3:13 PM,01/30/21

## 2021-01-30 NOTE — Progress Notes (Signed)
Physical Therapy Treatment Patient Details Name: Stacy Moran MRN: [id number] DOB: 1956-01-28 Today's Date: 01/30/2021   History of Present Illness Pt is a 65 y/o female presented to the ED  from her SNF due to respiratory distress after her tracheostomy tube apparently became dislodged.  History of ruptured intracranial aneurysm and subsequent SHD/SAH, bilateral true vocal cord paralysis, chronic tracheostomy and PEG, HTN, epilepsy, hypothyroidism, and VP shunt for recurrent hydrocephalus, anxiety. Difficulty placement leading to prolonged hospital stay.    PT Comments     Pt alert, seated EOB on room-air, requesting suction. RT performed suctioning and pt agreeable to tx on 2L [place] (per RT suggestion). Pt performed Berg balance test scoring 49/56 indicated moderate fall risk. Pt does demonstrate slight instability and sway when standing but does not lose balance. Pt requires time to regain balance when standing within a narrow BOS but is able to sustain once limits of stability are maintained. Pt encouraged to use RW during periods of anxiety or fatigue. Ambulated in room without AD and pt reaches for objects/wall for stability but no LOB noted. Skilled PT intervention is indicated to address deficits in function, mobility, and to return to PLOF as able.     Recommendations for follow up therapy are one component of a multi-disciplinary discharge planning process, led by the attending physician.  Recommendations may be updated based on patient status, additional functional criteria and insurance authorization.  Follow Up Recommendations  Follow physician's recommendations for discharge plan and follow up therapies     Assistance Recommended at Discharge Intermittent Supervision/Assistance  Equipment Recommendations  None recommended by PT    Recommendations for Other Services       Precautions / Restrictions Precautions Precautions: Fall Restrictions Weight Bearing Restrictions: No      Mobility  Bed Mobility               General bed mobility comments: Pt seated at EOB beginning and end of treatment    Transfers Overall transfer level: Needs assistance Equipment used: None Transfers: Sit to/from Stand Sit to Stand: Supervision           General transfer comment: x 3 STS w/ SUPV, each time there is a slight instability when standing but pt is able to correct w/ cues    Ambulation/Gait Ambulation/Gait assistance: Supervision Gait Distance (Feet): 30 Feet Assistive device: None Gait Pattern/deviations: Narrow base of support Gait velocity: Decreased   General Gait Details: on RA, forwardly flexed trunk   Stairs             Wheelchair Mobility    Modified Rankin (Stroke Patients Only)       Balance Overall balance assessment: Needs assistance Sitting-balance support: Feet supported;No upper extremity supported Sitting balance-Leahy Scale: Good     Standing balance support: No upper extremity supported;During functional activity Standing balance-Leahy Scale: Fair                   Standardized Balance Assessment Standardized Balance Assessment : Berg Balance Test Berg Balance Test Sit to Stand: Able to stand without using hands and stabilize independently Standing Unsupported: Able to stand 2 minutes with supervision Sitting with Back Unsupported but Feet Supported on Floor or Stool: Able to sit safely and securely 2 minutes Stand to Sit: Sits safely with minimal use of hands Transfers: Able to transfer safely, minor use of hands Standing Unsupported with Eyes Closed: Able to stand 10 seconds with supervision Standing Ubsupported with Feet Together: Able to place  feet together independently and stand 1 minute safely From Standing, Reach Forward with Outstretched Arm: Can reach forward >12 cm safely (5") From Standing Position, Pick up Object from Floor: Able to pick up shoe, needs supervision From Standing Position, Turn  to Look Behind Over each Shoulder: Looks behind one side only/other side shows less weight shift Turn 360 Degrees: Able to turn 360 degrees safely in 4 seconds or less Standing Unsupported, Alternately Place Feet on Step/Stool: Able to stand independently and complete 8 steps >20 seconds Standing Unsupported, One Foot in Front: Able to place foot tandem independently and hold 30 seconds Standing on One Leg: Able to lift leg independently and hold 5-10 seconds Total Score: 49        Cognition Arousal/Alertness: Awake/alert Behavior During Therapy: WFL for tasks assessed/performed Overall Cognitive Status: No family/caregiver present to determine baseline cognitive functioning                                 General Comments: pleasant and cooperative, follows one step commands        Exercises      General Comments        Pertinent Vitals/Pain Pain Assessment: No/denies pain    Home Living                          Prior Function            PT Goals (current goals can now be found in the care plan section) Progress towards PT goals: Progressing toward goals    Frequency    Min 2X/week      PT Plan Current plan remains appropriate    Co-evaluation              AM-PAC PT "6 Clicks" Mobility   Outcome Measure  Help needed turning from your back to your side while in a flat bed without using bedrails?: None Help needed moving from lying on your back to sitting on the side of a flat bed without using bedrails?: None Help needed moving to and from a bed to a chair (including a wheelchair)?: None Help needed standing up from a chair using your arms (e.g., wheelchair or bedside chair)?: None Help needed to walk in hospital room?: A Little Help needed climbing 3-5 steps with a railing? : A Lot 6 Click Score: 21    End of Session Equipment Utilized During Treatment: Gait belt;Oxygen Activity Tolerance: Patient tolerated treatment  well Patient left: in bed;with call bell/phone within reach;with nursing/sitter in room Nurse Communication: Mobility status PT Visit Diagnosis: Other abnormalities of gait and mobility (R26.89);Muscle weakness (generalized) (M62.81)     Time: 1000-1027 PT Time Calculation (min) (ACUTE ONLY): 27 min  Charges:                        [employer], SPT

## 2021-01-30 NOTE — Plan of Care (Signed)

## 2021-01-31 DIAGNOSIS — J9601 Acute respiratory failure with hypoxia: Secondary | ICD-10-CM | POA: Diagnosis not present

## 2021-01-31 DIAGNOSIS — J3802 Paralysis of vocal cords and larynx, bilateral: Secondary | ICD-10-CM | POA: Diagnosis not present

## 2021-01-31 DIAGNOSIS — Z930 Tracheostomy status: Secondary | ICD-10-CM | POA: Diagnosis not present

## 2021-01-31 LAB — CBC WITH DIFFERENTIAL/PLATELET
Abs Immature Granulocytes: 0 10*3/uL (ref 0.00–0.07)
Basophils Absolute: 0 10*3/uL (ref 0.0–0.1)
Basophils Relative: 0 %
Eosinophils Absolute: 0.1 10*3/uL (ref 0.0–0.5)
Eosinophils Relative: 3 %
HCT: 32.3 % — ABNORMAL LOW (ref 36.0–46.0)
Hemoglobin: 9.9 g/dL — ABNORMAL LOW (ref 12.0–15.0)
Immature Granulocytes: 0 %
Lymphocytes Relative: 40 %
Lymphs Abs: 1.6 10*3/uL (ref 0.7–4.0)
MCH: 25.4 pg — ABNORMAL LOW (ref 26.0–34.0)
MCHC: 30.7 g/dL (ref 30.0–36.0)
MCV: 83 fL (ref 80.0–100.0)
Monocytes Absolute: 0.4 10*3/uL (ref 0.1–1.0)
Monocytes Relative: 10 %
Neutro Abs: 1.8 10*3/uL (ref 1.7–7.7)
Neutrophils Relative %: 47 %
Platelets: 121 10*3/uL — ABNORMAL LOW (ref 150–400)
RBC: 3.89 MIL/uL (ref 3.87–5.11)
RDW: 14.7 % (ref 11.5–15.5)
WBC: 3.9 10*3/uL — ABNORMAL LOW (ref 4.0–10.5)
nRBC: 0 % (ref 0.0–0.2)

## 2021-01-31 LAB — BASIC METABOLIC PANEL
Anion gap: 5 (ref 5–15)
BUN: 26 mg/dL — ABNORMAL HIGH (ref 8–23)
CO2: 34 mmol/L — ABNORMAL HIGH (ref 22–32)
Calcium: 9.4 mg/dL (ref 8.9–10.3)
Chloride: 102 mmol/L (ref 98–111)
Creatinine, Ser: 0.51 mg/dL (ref 0.44–1.00)
GFR, Estimated: >60 mL/min (ref >60)
Glucose, Bld: 85 mg/dL (ref 70–99)
Potassium: 3.8 mmol/L (ref 3.5–5.1)
Sodium: 141 mmol/L (ref 135–145)

## 2021-01-31 LAB — GLUCOSE, CAPILLARY: Glucose-Capillary: 88 mg/dL (ref 70–99)

## 2021-01-31 LAB — MAGNESIUM: Magnesium: 2.4 mg/dL (ref 1.7–2.4)

## 2021-01-31 NOTE — Progress Notes (Signed)
PROGRESS NOTE    Stacy Moran  FXT:[id number] DOB: 08/02/1955 DOA: 12/14/2020 PCP: [name], MD    Brief Narrative:  [phone] with a history of ruptured intracranial aneurysm and subsequent SHD/SAH, bilateral true vocal cord paralysis, chronic tracheostomy and PEG, HTN, epilepsy, hypothyroidism, and VP shunt for recurrent hydrocephalus who presented from her SNF due to respiratory distress after her tracheostomy tube apparently became dislodged.   On arrival, she was emergently taken to the OR, intubated, and her tracheostomy tube was replaced by ENT, Dr. Pryor Ochoa after dilation of the existing fistula.  She has subsequently stabilized.  Has been ready for discharge to skilled nursing facility for many days now,  however no SNF bed has been available.    10/26-11/1: Waiting for SNF.  Difficult placement due to trach and PEG   Assessment & Plan:   Active Problems:   Tracheostomy status (HCC)   Acute respiratory failure (HCC)   PEG (percutaneous endoscopic gastrostomy) status ([place])   Essential hypertension   Bilateral vocal cord paralysis   Acute respiratory failure with hypoxemia. Status post tracheostomy and PEG. Bilateral vocal cord paralysis. Patient doing well, no new issue.  Mild leukopenia. Mild thrombocytopenia  Condition stable.   DVT prophylaxis: Lovenox Code Status: full Family Communication:  Disposition Plan:      Status is: Inpatient   Remains inpatient appropriate because: Unsafe discharge.         I/O last 3 completed shifts: In: 36 [Other:860; NG/GT:1010] Out: 0  Total I/O In: 355 [NG/GT:355] Out: -        Subjective: Patient doing well, still have a cough with airway secretion.  No short of breath.  No fever or chills. No dysuria hematuria.  Objective: Vitals:   01/31/21 0500 01/31/21 0517 01/31/21 0750 01/31/21 1214  BP:  101/73 110/80 120/81  Pulse:  74 76 80  Resp:  16 20 19   Temp:  97.6 F (36.4 C) 98.5 F (36.9 C)  98.8 F (37.1 C)  TempSrc:  Oral Oral Oral  SpO2:  93% 99% 100%  Weight: 59.6 kg     Height:        Intake/Output Summary (Last 24 hours) at 01/31/2021 1413 Last data filed at 01/31/2021 1000 Gross per 24 hour  Intake 1720 ml  Output 0 ml  Net 1720 ml   Filed Weights   01/25/21 0500 01/27/21 0500 01/31/21 0500  Weight: 59.8 kg 60.5 kg 59.6 kg    Examination:  General exam: Appears calm and comfortable  Respiratory system: Clear to auscultation. Respiratory effort normal. Cardiovascular system: S1 & S2 heard, RRR. No JVD, murmurs, rubs, gallops or clicks. No pedal edema. Gastrointestinal system: Abdomen is nondistended, soft and nontender. No organomegaly or masses felt. Normal bowel sounds heard. Central nervous system: Alert and oriented. No focal neurological deficits. Extremities: Symmetric 5 x 5 power. Skin: No rashes, lesions or ulcers Psychiatry: Judgement and insight appear normal. Mood & affect appropriate.     Data Reviewed: I have personally reviewed following labs and imaging studies  CBC: Recent Labs  Lab 01/31/21 0418  WBC 3.9*  NEUTROABS 1.8  HGB 9.9*  HCT 32.3*  MCV 83.0  PLT 426*   Basic Metabolic Panel: Recent Labs  Lab 01/31/21 0418  NA 141  K 3.8  CL 102  CO2 34*  GLUCOSE 85  BUN 26*  CREATININE 0.51  CALCIUM 9.4  MG 2.4   GFR: Estimated Creatinine Clearance: 67.4 mL/min (by C-G formula based on SCr of 0.51  mg/dL). Liver Function Tests: No results for input(s): AST, ALT, ALKPHOS, BILITOT, PROT, ALBUMIN in the last 168 hours. No results for input(s): LIPASE, AMYLASE in the last 168 hours. No results for input(s): AMMONIA in the last 168 hours. Coagulation Profile: No results for input(s): INR, PROTIME in the last 168 hours. Cardiac Enzymes: No results for input(s): CKTOTAL, CKMB, CKMBINDEX, TROPONINI in the last 168 hours. BNP (last 3 results) No results for input(s): PROBNP in the last 8760 hours. HbA1C: No results for  input(s): HGBA1C in the last 72 hours. CBG: Recent Labs  Lab 01/27/21 0552 01/28/21 0518 01/29/21 0508 01/30/21 0500 01/31/21 0518  GLUCAP 92 91 83 82 88   Lipid Profile: No results for input(s): CHOL, HDL, LDLCALC, TRIG, CHOLHDL, LDLDIRECT in the last 72 hours. Thyroid Function Tests: No results for input(s): TSH, T4TOTAL, FREET4, T3FREE, THYROIDAB in the last 72 hours. Anemia Panel: No results for input(s): VITAMINB12, FOLATE, FERRITIN, TIBC, IRON, RETICCTPCT in the last 72 hours. Sepsis Labs: No results for input(s): PROCALCITON, LATICACIDVEN in the last 168 hours.  No results found for this or any previous visit (from the past 240 hour(s)).       Radiology Studies: No results found.      Scheduled Meds:  amLODipine  5 mg Per Tube Daily   Chlorhexidine Gluconate Cloth  6 each Topical Daily   enoxaparin (LOVENOX) injection  40 mg Subcutaneous Q24H   feeding supplement (OSMOLITE 1.2 CAL)  355 mL Per Tube 5 X Daily   free water  150 mL Per Tube Q6H   guaiFENesin  10 mL Per Tube Q8H   levETIRAcetam  750 mg Per Tube BID   levothyroxine  100 mcg Per Tube Q0600   polyethylene glycol  17 g Per Tube BID   QUEtiapine  50 mg Per Tube BID   senna  1 tablet Per Tube Daily   Continuous Infusions:   LOS: 48 days    Time spent: 16 minutes    Sharen Hones, MD [facility]   To contact the attending provider between 7A-7P or the covering provider during after hours 7P-7A, please log into the web site [email or web address] and access using universal [place] password for that web site. If you do not have the password, please call the hospital operator.  01/31/2021, 2:13 PM

## 2021-02-01 DIAGNOSIS — J9601 Acute respiratory failure with hypoxia: Secondary | ICD-10-CM | POA: Diagnosis not present

## 2021-02-01 DIAGNOSIS — Z931 Gastrostomy status: Secondary | ICD-10-CM | POA: Diagnosis not present

## 2021-02-01 DIAGNOSIS — J3802 Paralysis of vocal cords and larynx, bilateral: Secondary | ICD-10-CM | POA: Diagnosis not present

## 2021-02-01 LAB — GLUCOSE, CAPILLARY: Glucose-Capillary: 88 mg/dL (ref 70–99)

## 2021-02-01 MED ORDER — FLUTICASONE PROPIONATE 50 MCG/ACT NA SUSP
1.0000 | Freq: Two times a day (BID) | NASAL | Status: DC
Start: 2021-02-01 — End: 2021-02-18
  Administered 2021-02-01 – 2021-02-18 (×34): 1 via NASAL
  Filled 2021-02-01 (×2): qty 16

## 2021-02-01 MED ORDER — FLUTICASONE PROPIONATE 50 MCG/ACT NA SUSP
1.0000 | Freq: Two times a day (BID) | NASAL | Status: DC
  Filled 2021-02-01: qty 16

## 2021-02-01 NOTE — Progress Notes (Signed)
PROGRESS NOTE    Stacy Moran  VOJ:[id number] DOB: 04-Nov-1955 DOA: 12/14/2020 PCP: [name], MD    Brief Narrative:  [phone] with a history of ruptured intracranial aneurysm and subsequent SHD/SAH, bilateral true vocal cord paralysis, chronic tracheostomy and PEG, HTN, epilepsy, hypothyroidism, and VP shunt for recurrent hydrocephalus who presented from her SNF due to respiratory distress after her tracheostomy tube apparently became dislodged.   On arrival, she was emergently taken to the OR, intubated, and her tracheostomy tube was replaced by ENT, Dr. Pryor Ochoa after dilation of the existing fistula.  She has subsequently stabilized.  Has been ready for discharge to skilled nursing facility for many days now,  however no SNF bed has been available.    10/26-11/1: Waiting for SNF.  Difficult placement due to trach and PEG   Assessment & Plan:   Active Problems:   Tracheostomy status (HCC)   Acute respiratory failure (HCC)   PEG (percutaneous endoscopic gastrostomy) status ([place])   Essential hypertension   Bilateral vocal cord paralysis   Acute respiratory failure with hypoxemia. Status post tracheostomy and PEG. Bilateral vocal cord paralysis. Patient essentially has no symptoms, she still has some airway secretion, requiring suctioning.  Otherwise doing well.  Mild leukopenia. Mild thrombocytopenia  Stable     DVT prophylaxis: Lovenox Code Status: full Family Communication:  Disposition Plan:      Status is: Inpatient   Remains inpatient appropriate because: Unsafe discharge.           I/O last 3 completed shifts: In: 73 [Other:565; NG/GT:3385] Out: 0  No intake/output data recorded.     Subjective: Patient doing well, tolerating tube feeding.  No abdominal pain nausea vomiting. No shortness of breath, still has some airway secretion requiring suctioning. No fever or chills. No dysuria hematuria.  Objective: Vitals:   02/01/21 0505 02/01/21  0734 02/01/21 0944 02/01/21 1120  BP: 105/65 117/88 (!) 142/97 109/67  Pulse: 68 80 85 81  Resp: 16 16 16 16   Temp: 98.4 F (36.9 C) 97.9 F (36.6 C)  98 F (36.7 C)  TempSrc: Oral Oral  Oral  SpO2: 97% 96% 95% 98%  Weight:      Height:        Intake/Output Summary (Last 24 hours) at 02/01/2021 1318 Last data filed at 02/01/2021 0552 Gross per 24 hour  Intake 2230 ml  Output --  Net 2230 ml   Filed Weights   01/27/21 0500 01/31/21 0500 02/01/21 0500  Weight: 60.5 kg 59.6 kg 60.8 kg    Examination:  General exam: Appears calm and comfortable  Respiratory system: Clear to auscultation. Respiratory effort normal. Cardiovascular system: S1 & S2 heard, RRR. No JVD, murmurs, rubs, gallops or clicks. No pedal edema. Gastrointestinal system: Abdomen is nondistended, soft and nontender. No organomegaly or masses felt. Normal bowel sounds heard. Central nervous system: Alert and oriented. No focal neurological deficits. Extremities: Symmetric 5 x 5 power. Skin: No rashes, lesions or ulcers Psychiatry: Judgement and insight appear normal. Mood & affect appropriate.     Data Reviewed: I have personally reviewed following labs and imaging studies  CBC: Recent Labs  Lab 01/31/21 0418  WBC 3.9*  NEUTROABS 1.8  HGB 9.9*  HCT 32.3*  MCV 83.0  PLT 169*   Basic Metabolic Panel: Recent Labs  Lab 01/31/21 0418  NA 141  K 3.8  CL 102  CO2 34*  GLUCOSE 85  BUN 26*  CREATININE 0.51  CALCIUM 9.4  MG 2.4  GFR: Estimated Creatinine Clearance: 67.4 mL/min (by C-G formula based on SCr of 0.51 mg/dL). Liver Function Tests: No results for input(s): AST, ALT, ALKPHOS, BILITOT, PROT, ALBUMIN in the last 168 hours. No results for input(s): LIPASE, AMYLASE in the last 168 hours. No results for input(s): AMMONIA in the last 168 hours. Coagulation Profile: No results for input(s): INR, PROTIME in the last 168 hours. Cardiac Enzymes: No results for input(s): CKTOTAL, CKMB,  CKMBINDEX, TROPONINI in the last 168 hours. BNP (last 3 results) No results for input(s): PROBNP in the last 8760 hours. HbA1C: No results for input(s): HGBA1C in the last 72 hours. CBG: Recent Labs  Lab 01/28/21 0518 01/29/21 0508 01/30/21 0500 01/31/21 0518 02/01/21 0540  GLUCAP 91 83 82 88 88   Lipid Profile: No results for input(s): CHOL, HDL, LDLCALC, TRIG, CHOLHDL, LDLDIRECT in the last 72 hours. Thyroid Function Tests: No results for input(s): TSH, T4TOTAL, FREET4, T3FREE, THYROIDAB in the last 72 hours. Anemia Panel: No results for input(s): VITAMINB12, FOLATE, FERRITIN, TIBC, IRON, RETICCTPCT in the last 72 hours. Sepsis Labs: No results for input(s): PROCALCITON, LATICACIDVEN in the last 168 hours.  No results found for this or any previous visit (from the past 240 hour(s)).       Radiology Studies: No results found.      Scheduled Meds:  amLODipine  5 mg Per Tube Daily   Chlorhexidine Gluconate Cloth  6 each Topical Daily   enoxaparin (LOVENOX) injection  40 mg Subcutaneous Q24H   feeding supplement (OSMOLITE 1.2 CAL)  355 mL Per Tube 5 X Daily   free water  150 mL Per Tube Q6H   guaiFENesin  10 mL Per Tube Q8H   levETIRAcetam  750 mg Per Tube BID   levothyroxine  100 mcg Per Tube Q0600   polyethylene glycol  17 g Per Tube BID   QUEtiapine  50 mg Per Tube BID   senna  1 tablet Per Tube Daily   Continuous Infusions:   LOS: 49 days    Time spent: 17 minutes    Sharen Hones, MD [facility]   To contact the attending provider between 7A-7P or the covering provider during after hours 7P-7A, please log into the web site [email or web address] and access using universal  password for that web site. If you do not have the password, please call the hospital operator.  02/01/2021, 1:18 PM

## 2021-02-01 NOTE — Progress Notes (Signed)
Respiratory Therapist called x2 for deep suctioning for patient this morning and afternoon.  Patient with increased nasal secretions and increased thin secretions requiring frequent suctioning.   Notified Dr. Roosevelt Locks of need for frequent tracheal suctioning and increased nasal secretions, orders given for Flonase nasal spray.

## 2021-02-02 DIAGNOSIS — J3802 Paralysis of vocal cords and larynx, bilateral: Secondary | ICD-10-CM | POA: Diagnosis not present

## 2021-02-02 DIAGNOSIS — J9601 Acute respiratory failure with hypoxia: Secondary | ICD-10-CM | POA: Diagnosis not present

## 2021-02-02 LAB — GLUCOSE, CAPILLARY
Glucose-Capillary: 80 mg/dL (ref 70–99)
Glucose-Capillary: 93 mg/dL (ref 70–99)
Glucose-Capillary: 176 mg/dL — ABNORMAL HIGH (ref 70–99)

## 2021-02-02 NOTE — Progress Notes (Signed)
Physical Therapy Treatment Patient Details Name: Stacy Moran MRN: [id number] DOB: May 26, 1955 Today's Date: 02/02/2021   History of Present Illness Pt is a 65 y/o female presented to the ED  from her SNF due to respiratory distress after her tracheostomy tube apparently became dislodged.  History of ruptured intracranial aneurysm and subsequent SHD/SAH, bilateral true vocal cord paralysis, chronic tracheostomy and PEG, HTN, epilepsy, hypothyroidism, and VP shunt for recurrent hydrocephalus, anxiety. Difficulty placement leading to prolonged hospital stay.    PT Comments    Pt alert, agreeable to therapy, increased congestion but pt denied need for suctioning. Pt ambulated around nursing station w/ RW with x 1 rest break, 2L [place] SpO2 > 97% throughout. Pt continues to demonstrate cross over gait pattern decreasing balance and requiring stability w/ RW. Cues for increasing step-width improved balance overall but pt does not sustain gait pattern. LOB x 1 during amb w/ stepping strategy response, light CGA for stabilization. Primary limitations remain balance, coordination, and endurance. PT to continue addressing during tx sessions. Skilled PT intervention is indicated to address deficits in function, mobility, and to return to PLOF as able.     Recommendations for follow up therapy are one component of a multi-disciplinary discharge planning process, led by the attending physician.  Recommendations may be updated based on patient status, additional functional criteria and insurance authorization.  Follow Up Recommendations  Follow physician's recommendations for discharge plan and follow up therapies     Assistance Recommended at Discharge Intermittent Supervision/Assistance  Equipment Recommendations  None recommended by PT    Recommendations for Other Services       Precautions / Restrictions Precautions Precautions: Fall Restrictions Weight Bearing Restrictions: No     Mobility  Bed  Mobility Overal bed mobility: Modified Independent             General bed mobility comments: HOB elevated    Transfers Overall transfer level: Needs assistance Equipment used: None Transfers: Sit to/from Stand Sit to Stand: Supervision           General transfer comment: occasional instability requiring UE support    Ambulation/Gait Ambulation/Gait assistance: Min guard Gait Distance (Feet): 180 Feet Assistive device: Rolling walker (2 wheels) Gait Pattern/deviations: Narrow base of support       General Gait Details: LOB x 1 w/ CGA for support, stepping strategy response; cues to increase step width   Stairs             Wheelchair Mobility    Modified Rankin (Stroke Patients Only)       Balance Overall balance assessment: Needs assistance Sitting-balance support: Feet supported;No upper extremity supported Sitting balance-Leahy Scale: Good     Standing balance support: During functional activity;Reliant on assistive device for balance   Standing balance comment: able to stand without BUE support but d/t cross over gait performs better w/ UE support                            Cognition Arousal/Alertness: Awake/alert Behavior During Therapy: WFL for tasks assessed/performed Overall Cognitive Status: No family/caregiver present to determine baseline cognitive functioning                                 General Comments: pleasant and cooperative, follows one step commands        Exercises      General Comments General comments (skin integrity,  edema, etc.): trach 6L 28% FiO2 at rest, 2L [place] w/ mobility      Pertinent Vitals/Pain Pain Assessment: No/denies pain    Home Living                          Prior Function            PT Goals (current goals can now be found in the care plan section) Progress towards PT goals: Progressing toward goals    Frequency    Min 2X/week      PT Plan  Current plan remains appropriate    Co-evaluation              AM-PAC PT "6 Clicks" Mobility   Outcome Measure  Help needed turning from your back to your side while in a flat bed without using bedrails?: None Help needed moving from lying on your back to sitting on the side of a flat bed without using bedrails?: None Help needed moving to and from a bed to a chair (including a wheelchair)?: None Help needed standing up from a chair using your arms (e.g., wheelchair or bedside chair)?: None Help needed to walk in hospital room?: A Little Help needed climbing 3-5 steps with a railing? : A Lot 6 Click Score: 21    End of Session Equipment Utilized During Treatment: Gait belt;Oxygen Activity Tolerance: Patient tolerated treatment well Patient left: with call bell/phone within reach;in bed   PT Visit Diagnosis: Other abnormalities of gait and mobility (R26.89);Muscle weakness (generalized) (M62.81)     Time: 0240-9735 PT Time Calculation (min) (ACUTE ONLY): 39 min  Charges:                        [employer], SPT

## 2021-02-02 NOTE — TOC Progression Note (Addendum)
Transition of Care [facility]) - Progression Note    Patient Details  Name: Stacy Moran MRN: [id number] Date of Birth: Apr 10, 1955  Transition [facility]) CM/SW [place], RN Phone Number: 02/02/2021, 9:11 AM  Clinical Narrative:  [facility] to check on the status of admission no answer left voice message to return call. Received a call from [facility] representative requesting discharge plans for patient and says she will call [place] to discuss placement because patient was there before. She also said she will send out list of facilities that are in network.    Expected Discharge Plan: [place] Barriers to Discharge: No SNF bed  Expected Discharge Plan and Services Expected Discharge Plan: [place]   Discharge Planning Services: CM Consult Post Acute Care Choice: [place] Living arrangements for the past 2 months: [place]                 DME Arranged: N/A DME Agency: NA       HH Arranged: NA HH Agency: NA         Social Determinants of Health (SDOH) Interventions    Readmission Risk Interventions Readmission Risk Prevention Plan 12/31/2020  Transportation Screening Complete  Medication Review Press photographer) Complete  PCP or Specialist appointment within 3-5 days of discharge Complete  HRI or Home Care Consult Complete  SW Recovery Care/Counseling Consult Complete  Palliative Care Screening Not [place] Complete

## 2021-02-02 NOTE — Progress Notes (Signed)
PROGRESS NOTE    Stacy Moran  LNL:[id number] DOB: [birth year] DOA: 12/14/2020 PCP: [name], MD    Brief Narrative:  [phone] with a history of ruptured intracranial aneurysm and subsequent SHD/SAH, bilateral true vocal cord paralysis, chronic tracheostomy and PEG, HTN, epilepsy, hypothyroidism, and VP shunt for recurrent hydrocephalus who presented from her SNF due to respiratory distress after her tracheostomy tube apparently became dislodged.   On arrival, she was emergently taken to the OR, intubated, and her tracheostomy tube was replaced by ENT, Dr. Pryor Ochoa after dilation of the existing fistula.  She has subsequently stabilized.  Has been ready for discharge to skilled nursing facility for many days now,  however no SNF bed has been available.    10/26-11/1: Waiting for SNF.  Difficult placement due to trach and PEG   Assessment & Plan:   Active Problems:   Tracheostomy status (HCC)   Acute respiratory failure (HCC)   PEG (percutaneous endoscopic gastrostomy) status ([place])   Essential hypertension   Bilateral vocal cord paralysis  Acute respiratory failure with hypoxemia. Status post tracheostomy and PEG. Bilateral vocal cord paralysis. Patient had increased postnasal drainage requiring more therapy suctioning.  Flonase started.  Oxygenation still stable. Patient still receiving tube feeding.  Mild leukopenia. Mild thrombocytopenia  Stable     DVT prophylaxis: Lovenox Code Status: full Family Communication:  Disposition Plan:      Status is: Inpatient   Remains inpatient appropriate because: Unsafe discharge.     I/O last 3 completed shifts: In: 2740 [Other:420; NG/GT:2320] Out: -  No intake/output data recorded.     Subjective: Patient had a significant increase in the postnasal draining requiring more suctions in the airway.  But she does not feel short of breath.  Still on 5 L oxygen on trach collar. Tolerating tube feeding without nausea  vomiting.  Objective: Vitals:   02/02/21 0500 02/02/21 0509 02/02/21 0841 02/02/21 1142  BP:  105/75 (!) 135/94 125/77  Pulse:  64 78 89  Resp:  16 15 18   Temp:  97.6 F (36.4 C) 98.1 F (36.7 C) 98.1 F (36.7 C)  TempSrc:  Oral  Oral  SpO2:  96% 98% 100%  Weight: 59.9 kg     Height:        Intake/Output Summary (Last 24 hours) at 02/02/2021 1202 Last data filed at 02/02/2021 0540 Gross per 24 hour  Intake 1520 ml  Output --  Net 1520 ml   Filed Weights   01/31/21 0500 02/01/21 0500 02/02/21 0500  Weight: 59.6 kg 60.8 kg 59.9 kg    Examination:  General exam: Appears calm and comfortable  Respiratory system: Clear to auscultation. Respiratory effort normal. Cardiovascular system: S1 & S2 heard, RRR. No JVD, murmurs, rubs, gallops or clicks. No pedal edema. Gastrointestinal system: Abdomen is nondistended, soft and nontender. No organomegaly or masses felt. Normal bowel sounds heard. Central nervous system: Alert and oriented. No focal neurological deficits. Extremities: Symmetric 5 x 5 power. Skin: No rashes, lesions or ulcers Psychiatry: Judgement and insight appear normal. Mood & affect appropriate.     Data Reviewed: I have personally reviewed following labs and imaging studies  CBC: Recent Labs  Lab 01/31/21 0418  WBC 3.9*  NEUTROABS 1.8  HGB 9.9*  HCT 32.3*  MCV 83.0  PLT 448*   Basic Metabolic Panel: Recent Labs  Lab 01/31/21 0418  NA 141  K 3.8  CL 102  CO2 34*  GLUCOSE 85  BUN 26*  CREATININE 0.51  CALCIUM 9.4  MG 2.4   GFR: Estimated Creatinine Clearance: 67.4 mL/min (by C-G formula based on SCr of 0.51 mg/dL). Liver Function Tests: No results for input(s): AST, ALT, ALKPHOS, BILITOT, PROT, ALBUMIN in the last 168 hours. No results for input(s): LIPASE, AMYLASE in the last 168 hours. No results for input(s): AMMONIA in the last 168 hours. Coagulation Profile: No results for input(s): INR, PROTIME in the last 168 hours. Cardiac  Enzymes: No results for input(s): CKTOTAL, CKMB, CKMBINDEX, TROPONINI in the last 168 hours. BNP (last 3 results) No results for input(s): PROBNP in the last 8760 hours. HbA1C: No results for input(s): HGBA1C in the last 72 hours. CBG: Recent Labs  Lab 01/31/21 0518 02/01/21 0540 02/02/21 0512 02/02/21 0842 02/02/21 1144  GLUCAP 88 88 80 93 176*   Lipid Profile: No results for input(s): CHOL, HDL, LDLCALC, TRIG, CHOLHDL, LDLDIRECT in the last 72 hours. Thyroid Function Tests: No results for input(s): TSH, T4TOTAL, FREET4, T3FREE, THYROIDAB in the last 72 hours. Anemia Panel: No results for input(s): VITAMINB12, FOLATE, FERRITIN, TIBC, IRON, RETICCTPCT in the last 72 hours. Sepsis Labs: No results for input(s): PROCALCITON, LATICACIDVEN in the last 168 hours.  No results found for this or any previous visit (from the past 240 hour(s)).       Radiology Studies: No results found.      Scheduled Meds:  amLODipine  5 mg Per Tube Daily   Chlorhexidine Gluconate Cloth  6 each Topical Daily   enoxaparin (LOVENOX) injection  40 mg Subcutaneous Q24H   feeding supplement (OSMOLITE 1.2 CAL)  355 mL Per Tube 5 X Daily   fluticasone  1 spray Each Nare BID   free water  150 mL Per Tube Q6H   guaiFENesin  10 mL Per Tube Q8H   levETIRAcetam  750 mg Per Tube BID   levothyroxine  100 mcg Per Tube Q0600   polyethylene glycol  17 g Per Tube BID   QUEtiapine  50 mg Per Tube BID   senna  1 tablet Per Tube Daily   Continuous Infusions:   LOS: 50 days    Time spent: 17 minutes    Sharen Hones, MD [facility]   To contact the attending provider between 7A-7P or the covering provider during after hours 7P-7A, please log into the web site [email or web address] and access using universal [place] password for that web site. If you do not have the password, please call the hospital operator.  02/02/2021, 12:02 PM

## 2021-02-03 DIAGNOSIS — J3802 Paralysis of vocal cords and larynx, bilateral: Secondary | ICD-10-CM | POA: Diagnosis not present

## 2021-02-03 DIAGNOSIS — J9601 Acute respiratory failure with hypoxia: Secondary | ICD-10-CM | POA: Diagnosis not present

## 2021-02-03 DIAGNOSIS — Z930 Tracheostomy status: Secondary | ICD-10-CM | POA: Diagnosis not present

## 2021-02-03 LAB — GLUCOSE, CAPILLARY: Glucose-Capillary: 75 mg/dL (ref 70–99)

## 2021-02-03 NOTE — Progress Notes (Signed)
Patient's airway was through the tracheostomy. There was a moderate amount of clear white thick secretions. The patient had no issues with suctioning the airway.

## 2021-02-03 NOTE — Progress Notes (Signed)
PROGRESS NOTE    Stacy Moran  XNA:[id number] DOB: 1956-01-04 DOA: 12/14/2020 PCP: [name], MD    Brief Narrative:   [phone] with a history of ruptured intracranial aneurysm and subsequent SHD/SAH, bilateral true vocal cord paralysis, chronic tracheostomy and PEG, HTN, epilepsy, hypothyroidism, and VP shunt for recurrent hydrocephalus who presented from her SNF due to respiratory distress after her tracheostomy tube apparently became dislodged.   On arrival, she was emergently taken to the OR, intubated, and her tracheostomy tube was replaced by ENT, Dr. Pryor Ochoa after dilation of the existing fistula.  She has subsequently stabilized.  Has been ready for discharge to skilled nursing facility for many days now,  however no SNF bed has been available.    Waiting for SNF.  Difficult placement due to trach and PEG  Assessment & Plan:   Active Problems:   Tracheostomy status (HCC)   Acute respiratory failure (HCC)   PEG (percutaneous endoscopic gastrostomy) status ([place])   Essential hypertension   Bilateral vocal cord paralysis  Acute respiratory failure with hypoxemia. Status post tracheostomy and PEG. Bilateral vocal cord paralysis. Patient still has significant postnasal draining, requiring suctioning from tracheostomy tube.  Otherwise she is doing well, oxygenation stable.  Continue Flonase.  Mild leukopenia. Mild thrombocytopenia  Stable    DVT prophylaxis: Lovenox Code Status: full Family Communication:  Disposition Plan:      Status is: Inpatient   Remains inpatient appropriate because: Unsafe discharge.  Patient has a bed offer from nursing home, currently pending availability of a bed.      I/O last 3 completed shifts: In: 1220 [Other:210; NG/GT:1010] Out: -  No intake/output data recorded.        Subjective: Patient still requiring multiple sections a day from airway due to postnasal draining.  She does not have any short of breath, she was  able to walk by herself.  She is still on 5 L oxygen over trach collar. She is still receiving tube feeding, no nausea vomiting or diarrhea. No fever or chills.  Objective: Vitals:   02/03/21 0500 02/03/21 0532 02/03/21 0800 02/03/21 0952  BP:  111/72 120/79 126/86  Pulse:  64 84 75  Resp:  18 18   Temp:  (!) 97.5 F (36.4 C) 98.3 F (36.8 C)   TempSrc:  Oral    SpO2:  100% 97%   Weight: 60.1 kg     Height:       No intake or output data in the 24 hours ending 02/03/21 1240 Filed Weights   02/01/21 0500 02/02/21 0500 02/03/21 0500  Weight: 60.8 kg 59.9 kg 60.1 kg    Examination:  General exam: Appears calm and comfortable  Respiratory system: Clear to auscultation. Respiratory effort normal. Cardiovascular system: S1 & S2 heard, RRR. No JVD, murmurs, rubs, gallops or clicks. No pedal edema. Gastrointestinal system: Abdomen is nondistended, soft and nontender. No organomegaly or masses felt. Normal bowel sounds heard. Central nervous system: Alert and oriented. No focal neurological deficits. Extremities: Symmetric 5 x 5 power. Skin: No rashes, lesions or ulcers Psychiatry: Judgement and insight appear normal. Mood & affect appropriate.     Data Reviewed: I have personally reviewed following labs and imaging studies  CBC: Recent Labs  Lab 01/31/21 0418  WBC 3.9*  NEUTROABS 1.8  HGB 9.9*  HCT 32.3*  MCV 83.0  PLT 062*   Basic Metabolic Panel: Recent Labs  Lab 01/31/21 0418  NA 141  K 3.8  CL 102  CO2  34*  GLUCOSE 85  BUN 26*  CREATININE 0.51  CALCIUM 9.4  MG 2.4   GFR: Estimated Creatinine Clearance: 67.4 mL/min (by C-G formula based on SCr of 0.51 mg/dL). Liver Function Tests: No results for input(s): AST, ALT, ALKPHOS, BILITOT, PROT, ALBUMIN in the last 168 hours. No results for input(s): LIPASE, AMYLASE in the last 168 hours. No results for input(s): AMMONIA in the last 168 hours. Coagulation Profile: No results for input(s): INR, PROTIME in the  last 168 hours. Cardiac Enzymes: No results for input(s): CKTOTAL, CKMB, CKMBINDEX, TROPONINI in the last 168 hours. BNP (last 3 results) No results for input(s): PROBNP in the last 8760 hours. HbA1C: No results for input(s): HGBA1C in the last 72 hours. CBG: Recent Labs  Lab 02/01/21 0540 02/02/21 0512 02/02/21 0842 02/02/21 1144 02/03/21 0529  GLUCAP 88 80 93 176* 75   Lipid Profile: No results for input(s): CHOL, HDL, LDLCALC, TRIG, CHOLHDL, LDLDIRECT in the last 72 hours. Thyroid Function Tests: No results for input(s): TSH, T4TOTAL, FREET4, T3FREE, THYROIDAB in the last 72 hours. Anemia Panel: No results for input(s): VITAMINB12, FOLATE, FERRITIN, TIBC, IRON, RETICCTPCT in the last 72 hours. Sepsis Labs: No results for input(s): PROCALCITON, LATICACIDVEN in the last 168 hours.  No results found for this or any previous visit (from the past 240 hour(s)).       Radiology Studies: No results found.      Scheduled Meds:  amLODipine  5 mg Per Tube Daily   Chlorhexidine Gluconate Cloth  6 each Topical Daily   enoxaparin (LOVENOX) injection  40 mg Subcutaneous Q24H   feeding supplement (OSMOLITE 1.2 CAL)  355 mL Per Tube 5 X Daily   fluticasone  1 spray Each Nare BID   free water  150 mL Per Tube Q6H   guaiFENesin  10 mL Per Tube Q8H   levETIRAcetam  750 mg Per Tube BID   levothyroxine  100 mcg Per Tube Q0600   polyethylene glycol  17 g Per Tube BID   QUEtiapine  50 mg Per Tube BID   senna  1 tablet Per Tube Daily   Continuous Infusions:   LOS: 51 days    Time spent: 16 minutes    Sharen Hones, MD [facility]   To contact the attending provider between 7A-7P or the covering provider during after hours 7P-7A, please log into the web site [email or web address] and access using universal [place] password for that web site. If you do not have the password, please call the hospital operator.  02/03/2021, 12:40 PM

## 2021-02-03 NOTE — Progress Notes (Signed)
Patient's airway was through the tracheostomy. There was a small amount of clear white thick secretions. The patient had no issues with suctioning the airway.

## 2021-02-03 NOTE — TOC Progression Note (Signed)
Transition of Care [facility]) - Progression Note    Patient Details  Name: Stacy Moran MRN: [id number] Date of Birth: 11/27/1955  [facility] of [facility]) CM/SW Contact  [name], RN Phone Number: 02/03/2021, 8:52 AM  Clinical Narrative:    RNCM spoke with Jackelyn Poling at [facility]- patient looks good for admission but they do not have a female bed, they are going to discuss in their morning meeting how to open up some beds.   Expected Discharge Plan: [place] Barriers to Discharge: No SNF bed  Expected Discharge Plan and Services Expected Discharge Plan: [place]   Discharge Planning Services: CM Consult Post Acute Care Choice: [place] Living arrangements for the past 2 months: [place]                 DME Arranged: N/A DME Agency: NA       HH Arranged: NA HH Agency: NA         Social Determinants of Health (SDOH) Interventions    Readmission Risk Interventions Readmission Risk Prevention Plan 12/31/2020  Transportation Screening Complete  Medication Review Press photographer) Complete  PCP or Specialist appointment within 3-5 days of discharge Complete  HRI or Home Care Consult Complete  SW Recovery Care/Counseling Consult Complete  Palliative Care Screening Not [place] Complete

## 2021-02-04 DIAGNOSIS — I10 Essential (primary) hypertension: Secondary | ICD-10-CM | POA: Diagnosis not present

## 2021-02-04 DIAGNOSIS — J9600 Acute respiratory failure, unspecified whether with hypoxia or hypercapnia: Secondary | ICD-10-CM

## 2021-02-04 DIAGNOSIS — Z931 Gastrostomy status: Secondary | ICD-10-CM | POA: Diagnosis not present

## 2021-02-04 DIAGNOSIS — J3802 Paralysis of vocal cords and larynx, bilateral: Secondary | ICD-10-CM | POA: Diagnosis not present

## 2021-02-04 LAB — GLUCOSE, CAPILLARY: Glucose-Capillary: 88 mg/dL (ref 70–99)

## 2021-02-04 NOTE — Progress Notes (Signed)
PROGRESS NOTE    Stacy Moran  TTS:[id number] DOB: 10/26/1955 DOA: 12/14/2020 PCP: [name], MD    Brief Narrative:   [phone] with a history of ruptured intracranial aneurysm and subsequent SHD/SAH, bilateral true vocal cord paralysis, chronic tracheostomy and PEG, HTN, epilepsy, hypothyroidism, and VP shunt for recurrent hydrocephalus who presented from her SNF due to respiratory distress after her tracheostomy tube apparently became dislodged.   On arrival, she was emergently taken to the OR, intubated, and her tracheostomy tube was replaced by ENT, Dr. Pryor Ochoa after dilation of the existing fistula.  She has subsequently stabilized.  Has been ready for discharge to skilled nursing facility for many days now,  however no SNF bed has been available.    Waiting for SNF.  Difficult placement due to trach and PEG  Assessment & Plan:   Active Problems:   Tracheostomy status (HCC)   Acute respiratory failure (HCC)   PEG (percutaneous endoscopic gastrostomy) status ([place])   Essential hypertension   Bilateral vocal cord paralysis  Acute respiratory failure with hypoxemia. Status post tracheostomy and PEG. Bilateral vocal cord paralysis. Patient still has significant postnasal draining, requiring suctioning from tracheostomy tube.  Otherwise she is doing well, oxygenation stable.   11/9 continue flonase  Mild leukopenia. Mild thrombocytopenia  11/9 stable    DVT prophylaxis: Lovenox Code Status: full Family Communication:  Disposition Plan:      Status is: Inpatient   Remains inpatient appropriate because: Unsafe discharge.  Patient has a bed offer from nursing home, currently pending availability of a bed.              Subjective: No sob, cp  Objective: Vitals:   02/04/21 0500 02/04/21 0530 02/04/21 0825 02/04/21 1230  BP:  (!) 129/93 112/73 115/75  Pulse:  70 83 82  Resp:  18 20 17   Temp:  98 F (36.7 C) 97.9 F (36.6 C) 98 F (36.7 C)  TempSrc:   Oral Oral Oral  SpO2:  100% 100% 100%  Weight: 60 kg     Height:       No intake or output data in the 24 hours ending 02/04/21 1408 Filed Weights   02/02/21 0500 02/03/21 0500 02/04/21 0500  Weight: 59.9 kg 60.1 kg 60 kg    Examination: Nad, calm Cta no w/r Reuglar s1/s2 no gallop Soft benign +Bs No edema   Data Reviewed: I have personally reviewed following labs and imaging studies  CBC: Recent Labs  Lab 01/31/21 0418  WBC 3.9*  NEUTROABS 1.8  HGB 9.9*  HCT 32.3*  MCV 83.0  PLT 300*   Basic Metabolic Panel: Recent Labs  Lab 01/31/21 0418  NA 141  K 3.8  CL 102  CO2 34*  GLUCOSE 85  BUN 26*  CREATININE 0.51  CALCIUM 9.4  MG 2.4   GFR: Estimated Creatinine Clearance: 67.4 mL/min (by C-G formula based on SCr of 0.51 mg/dL). Liver Function Tests: No results for input(s): AST, ALT, ALKPHOS, BILITOT, PROT, ALBUMIN in the last 168 hours. No results for input(s): LIPASE, AMYLASE in the last 168 hours. No results for input(s): AMMONIA in the last 168 hours. Coagulation Profile: No results for input(s): INR, PROTIME in the last 168 hours. Cardiac Enzymes: No results for input(s): CKTOTAL, CKMB, CKMBINDEX, TROPONINI in the last 168 hours. BNP (last 3 results) No results for input(s): PROBNP in the last 8760 hours. HbA1C: No results for input(s): HGBA1C in the last 72 hours. CBG: Recent Labs  Lab 02/02/21  1308 02/02/21 0842 02/02/21 1144 02/03/21 0529 02/04/21 0537  GLUCAP 80 93 176* 75 88   Lipid Profile: No results for input(s): CHOL, HDL, LDLCALC, TRIG, CHOLHDL, LDLDIRECT in the last 72 hours. Thyroid Function Tests: No results for input(s): TSH, T4TOTAL, FREET4, T3FREE, THYROIDAB in the last 72 hours. Anemia Panel: No results for input(s): VITAMINB12, FOLATE, FERRITIN, TIBC, IRON, RETICCTPCT in the last 72 hours. Sepsis Labs: No results for input(s): PROCALCITON, LATICACIDVEN in the last 168 hours.  No results found for this or any previous  visit (from the past 240 hour(s)).       Radiology Studies: No results found.      Scheduled Meds:  amLODipine  5 mg Per Tube Daily   Chlorhexidine Gluconate Cloth  6 each Topical Daily   enoxaparin (LOVENOX) injection  40 mg Subcutaneous Q24H   feeding supplement (OSMOLITE 1.2 CAL)  355 mL Per Tube 5 X Daily   fluticasone  1 spray Each Nare BID   free water  150 mL Per Tube Q6H   guaiFENesin  10 mL Per Tube Q8H   levETIRAcetam  750 mg Per Tube BID   levothyroxine  100 mcg Per Tube Q0600   polyethylene glycol  17 g Per Tube BID   QUEtiapine  50 mg Per Tube BID   senna  1 tablet Per Tube Daily   Continuous Infusions:   LOS: 52 days    Time spent: 15 minutes    Nolberto Hanlon, MD [facility]   To contact the attending provider between 7A-7P or the covering provider during after hours 7P-7A, please log into the web site [email or web address] and access using universal [place] password for that web site. If you do not have the password, please call the hospital operator.  02/04/2021, 2:08 PM

## 2021-02-04 NOTE — Progress Notes (Signed)
Occupational Therapy Treatment Patient Details Name: Stacy Moran MRN: [id number] DOB: 07/30/1955 Today's Date: 02/04/2021   History of present illness Pt is a 65 y/o female presented to the ED  from her SNF due to respiratory distress after her tracheostomy tube apparently became dislodged.  History of ruptured intracranial aneurysm and subsequent SHD/SAH, bilateral true vocal cord paralysis, chronic tracheostomy and PEG, HTN, epilepsy, hypothyroidism, and VP shunt for recurrent hydrocephalus, anxiety. Difficulty placement leading to prolonged hospital stay.   OT comments  Upon entering the room, pt seated in recliner chair with no signs or c/o pain. Pt stands from recliner chair and ambulates with close supervision into bathroom for toileting without use of AD. Pt demonstrates transfer, hygiene, and clothing management without assistance. Pt returns and picks up adult coloring book and pencils and carries to desk area with close supervision and no LOB. Pt taking rest and coloring in book for a few minutes. RN arrives and reports need to give medications through PEG and RN notified of pt's request for respiratory to suction. Pt does not appear in any respiratory distress and remains on RA during session. She picks up items from table and returns back to recliner chair for medications. PMSV falls on floor and OT cleaned by swirling in cup with warm soapy water. All needs within reach. Pt very cooperative and progress during therapy session towards functional tasks.    Recommendations for follow up therapy are one component of a multi-disciplinary discharge planning process, led by the attending physician.  Recommendations may be updated based on patient status, additional functional criteria and insurance authorization.    Follow Up Recommendations  Follow physician's recommendations for discharge plan and follow up therapies    Assistance Recommended at Discharge Intermittent Supervision/Assistance         Precautions / Restrictions Precautions Precautions: Fall Restrictions Weight Bearing Restrictions: No       Mobility Bed Mobility Overal bed mobility: Modified Independent Bed Mobility: Supine to Sit     Supine to sit: Modified independent (Device/Increase time);HOB elevated     General bed mobility comments: seated in recliner chair    Transfers Overall transfer level: Needs assistance Equipment used: None Transfers: Sit to/from Stand Sit to Stand: Modified independent (Device/Increase time) Stand pivot transfers: Supervision         General transfer comment: MOD I  for increased time     Balance Overall balance assessment: Needs assistance Sitting-balance support: Feet supported;No upper extremity supported Sitting balance-Leahy Scale: Good     Standing balance support: During functional activity Standing balance-Leahy Scale: Good Standing balance comment: single limb stance without UE support < 5 seconds                           ADL either performed or assessed with clinical judgement   ADL Overall ADL's : Needs assistance/impaired                         Toilet Transfer: Supervision/safety   Toileting- Clothing Manipulation and Hygiene: Supervision/safety       Functional mobility during ADLs: Supervision/safety      Extremity/Trunk Assessment Upper Extremity Assessment Upper Extremity Assessment: Overall WFL for tasks assessed            Vision Patient Visual Report: No change from baseline            Cognition Arousal/Alertness: Awake/alert Behavior During Therapy: WFL for tasks assessed/performed  Overall Cognitive Status: No family/caregiver present to determine baseline cognitive functioning                                 General Comments: pleasant and cooperative, follows one step commands          Exercises Other Exercises Other Exercises: Pelvic corrections w/ RW and static  stance d/t R pelvic rotation leading to cross over gait pattern; worked on keeping RW staight with head turns as pt has a tendency to rotate entire trunk leading to instability and crossing feet over           Pertinent Vitals/ Pain       Pain Assessment: No/denies pain         Frequency  Min 2X/week        Progress Toward Goals  OT Goals(current goals can now be found in the care plan section)  Progress towards OT goals: Progressing toward goals  Acute Rehab OT Goals Patient Stated Goal: to get better OT Goal Formulation: With patient Time For Goal Achievement: 02/10/21 Potential to Achieve Goals: [place] Discharge plan remains appropriate;Frequency needs to be updated       AM-PAC OT "6 Clicks" Daily Activity     Outcome Measure   Help from another person eating meals?: None Help from another person taking care of personal grooming?: None Help from another person toileting, which includes using toliet, bedpan, or urinal?: None   Help from another person to put on and taking off regular upper body clothing?: None Help from another person to put on and taking off regular lower body clothing?: None 6 Click Score: 20    End of Session    OT Visit Diagnosis: Other symptoms and signs involving cognitive function   Activity Tolerance Patient tolerated treatment well   Patient Left in chair;with nursing/sitter in room;with call bell/phone within reach   Nurse Communication          Time: 5300-5110 OT Time Calculation (min): 24 min  Charges: OT General Charges $OT Visit: 1 Visit OT Treatments $Self Care/Home Management : 8-22 mins $Therapeutic Activity: 8-22 mins  Darleen Crocker, MS, OTR/L , CBIS ascom [phone]  02/04/21, 3:58 PM

## 2021-02-04 NOTE — Progress Notes (Signed)
Nutrition Follow-up  DOCUMENTATION CODES:   Severe malnutrition in context of chronic illness  INTERVENTION:   Continue TF via PEG. 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  Ongoing  GOAL:   Patient will meet greater than or equal to 90% of their needs  Met with TF  MONITOR:   TF tolerance, Labs, Weight trends, Vent status, I & O's  REASON FOR ASSESSMENT:   Consult Enteral/tube feeding initiation and management  ASSESSMENT:   [age] female with PMH of vocal cord paralysis with chronic tracheostomy, HTN, hx subarachnoid hemorrhage, chronic PEG tube, anxiety, and ventriculoperitoneal shunt for recurrent hydrocephalus [facility]. Patient was found in respiratory distress in her room with a tracheostomy tube on the floor.  Pt remains medically stable for discharge. TOC continues to work to locate placement. She remains difficult to place secondary to PEG and trach.    She continues to tolerate TF well.     Reviewed wt hx; wt has been stable over the past month.   Medications reviewed and include miralax, keppra, miralax, and senna.   Labs reviewed: CBGS: 75-176.  Diet Order:   Diet Order             Diet NPO time specified  Diet effective now                   EDUCATION NEEDS:   Education needs have been addressed  Skin:  Skin Assessment: Reviewed RN Assessment  Last BM:  01/26/21  Height:   Ht Readings from Last 1 Encounters:  12/14/20 5' 6"  (1.676 m)    Weight:   Wt Readings from Last 1 Encounters:  02/04/21 60 kg    Ideal Body Weight:  59.1 kg  BMI:   Body mass index is 21.35 kg/m.  Estimated Nutritional Needs:   Kcal:  1800-2000 kcal  Protein:  90-100 g/d  Fluid:  1700-1900 mL/d    JenLoistine ChanceD, LDN, CDCPort Isabelgistered Dietitian II Certified Diabetes Care and Education Specialist Please refer to [facility] RD and/or RD on-call/weekend/after hours pager

## 2021-02-04 NOTE — Progress Notes (Signed)
Physical Therapy Treatment Patient Details Name: Stacy Moran MRN: [id number] DOB: Jan 14, 1956 Today's Date: 02/04/2021   History of Present Illness Pt is a 65 y/o female presented to the ED  from her SNF due to respiratory distress after her tracheostomy tube apparently became dislodged.  History of ruptured intracranial aneurysm and subsequent SHD/SAH, bilateral true vocal cord paralysis, chronic tracheostomy and PEG, HTN, epilepsy, hypothyroidism, and VP shunt for recurrent hydrocephalus, anxiety. Difficulty placement leading to prolonged hospital stay.    PT Comments    Pt in bed, agreeable to therapy. Increased congestion noted prior to treatment, pt denied suctioning prior to ambulation. Primary limitations are decreased balance and stability during functional mobility tasks. Worked on single leg stance with pelvic corrections and dynamic balance activities during amb. Pt has a tendency to rotate trunk and pelvis during cervical rotation causing her feet to cross over. Discharge recommendations are to follow physician's recs. Skilled PT intervention is indicated to address deficits in function, mobility, and to return to PLOF as able.     Recommendations for follow up therapy are one component of a multi-disciplinary discharge planning process, led by the attending physician.  Recommendations may be updated based on patient status, additional functional criteria and insurance authorization.  Follow Up Recommendations  Follow physician's recommendations for discharge plan and follow up therapies     Assistance Recommended at Discharge Intermittent Supervision/Assistance  Equipment Recommendations  None recommended by PT    Recommendations for Other Services       Precautions / Restrictions Precautions Precautions: Fall Restrictions Weight Bearing Restrictions: No     Mobility  Bed Mobility Overal bed mobility: Modified Independent Bed Mobility: Supine to Sit     Supine to  sit: Modified independent (Device/Increase time);HOB elevated          Transfers Overall transfer level: Needs assistance Equipment used: None Transfers: Sit to/from Stand Sit to Stand: Modified independent (Device/Increase time)           General transfer comment: MOD I  for increased time    Ambulation/Gait Ambulation/Gait assistance: Supervision Gait Distance (Feet): 200 Feet Assistive device: Rolling walker (2 wheels) Gait Pattern/deviations: Narrow base of support       General Gait Details: MOD I w/ proper RW usage, but pt prefers to utilize walls, supv without AD   Stairs             Wheelchair Mobility    Modified Rankin (Stroke Patients Only)       Balance Overall balance assessment: Needs assistance Sitting-balance support: Feet supported;No upper extremity supported Sitting balance-Leahy Scale: Good     Standing balance support: During functional activity Standing balance-Leahy Scale: Good Standing balance comment: single limb stance without UE support < 5 seconds                            Cognition Arousal/Alertness: Awake/alert Behavior During Therapy: WFL for tasks assessed/performed Overall Cognitive Status: No family/caregiver present to determine baseline cognitive functioning                                          Exercises Other Exercises Other Exercises: Pelvic corrections w/ RW and static stance d/t R pelvic rotation leading to cross over gait pattern; worked on keeping RW staight with head turns as pt has a tendency to rotate entire trunk leading  to instability and crossing feet over    General Comments        Pertinent Vitals/Pain Pain Assessment: No/denies pain    Home Living                          Prior Function            PT Goals (current goals can now be found in the care plan section) Progress towards PT goals: Progressing toward goals    Frequency    Min  2X/week      PT Plan Current plan remains appropriate    Co-evaluation              AM-PAC PT "6 Clicks" Mobility   Outcome Measure  Help needed turning from your back to your side while in a flat bed without using bedrails?: None Help needed moving from lying on your back to sitting on the side of a flat bed without using bedrails?: None Help needed moving to and from a bed to a chair (including a wheelchair)?: None Help needed standing up from a chair using your arms (e.g., wheelchair or bedside chair)?: None Help needed to walk in hospital room?: A Little Help needed climbing 3-5 steps with a railing? : A Lot 6 Click Score: 21    End of Session Equipment Utilized During Treatment: Gait belt;Oxygen Activity Tolerance: Patient tolerated treatment well Patient left: with call bell/phone within reach;in chair   PT Visit Diagnosis: Other abnormalities of gait and mobility (R26.89);Muscle weakness (generalized) (M62.81)     Time: 5732-2025 PT Time Calculation (min) (ACUTE ONLY): 24 min  Charges:             [employer], SPT

## 2021-02-04 NOTE — TOC Progression Note (Signed)
Transition of Care [facility]) - Progression Note    Patient Details  Name: Stacy Moran MRN: [id number] Date of Birth: [birth year]  Transition of Care [facility]) CM/SW Contact  [name], RN Phone Number: 02/04/2021, 3:57 PM  Clinical Narrative:    Waiting to hear back from [place].  RNCM called 4 times today and left 2 messages for return call.   Expected Discharge Plan: [place] Barriers to Discharge: No SNF bed  Expected Discharge Plan and Services Expected Discharge Plan: [place]   Discharge Planning Services: CM Consult Post Acute Care Choice: [place] Living arrangements for the past 2 months: [place]                 DME Arranged: N/A DME Agency: NA       HH Arranged: NA HH Agency: NA         Social Determinants of Health (SDOH) Interventions    Readmission Risk Interventions Readmission Risk Prevention Plan 12/31/2020  Transportation Screening Complete  Medication Review Press photographer) Complete  PCP or Specialist appointment within 3-5 days of discharge Complete  HRI or Home Care Consult Complete  SW Recovery Care/Counseling Consult Complete  Palliative Care Screening Not [place] Complete

## 2021-02-05 DIAGNOSIS — J3802 Paralysis of vocal cords and larynx, bilateral: Secondary | ICD-10-CM | POA: Diagnosis not present

## 2021-02-05 DIAGNOSIS — I10 Essential (primary) hypertension: Secondary | ICD-10-CM | POA: Diagnosis not present

## 2021-02-05 DIAGNOSIS — J9600 Acute respiratory failure, unspecified whether with hypoxia or hypercapnia: Secondary | ICD-10-CM | POA: Diagnosis not present

## 2021-02-05 LAB — GLUCOSE, CAPILLARY: Glucose-Capillary: 88 mg/dL (ref 70–99)

## 2021-02-05 MED ORDER — GLYCOPYRROLATE 1 MG PO TABS
1.0000 mg | ORAL_TABLET | Freq: Every day | ORAL | Status: DC
  Administered 2021-02-05: 18:00:00 1 mg via ORAL
  Filled 2021-02-05: qty 1

## 2021-02-05 MED ORDER — GLYCOPYRROLATE 1 MG PO TABS
1.0000 mg | ORAL_TABLET | Freq: Every day | ORAL | Status: DC
  Administered 2021-02-06 – 2021-02-11 (×6): 1 mg
  Filled 2021-02-05 (×7): qty 1

## 2021-02-05 NOTE — Progress Notes (Signed)
PROGRESS NOTE    Stacy Moran  IHK:[id number] DOB: 1956/03/08 DOA: 12/14/2020 PCP: [name], MD    Brief Narrative:   [phone] with a history of ruptured intracranial aneurysm and subsequent SHD/SAH, bilateral true vocal cord paralysis, chronic tracheostomy and PEG, HTN, epilepsy, hypothyroidism, and VP shunt for recurrent hydrocephalus who presented from her SNF due to respiratory distress after her tracheostomy tube apparently became dislodged.   On arrival, she was emergently taken to the OR, intubated, and her tracheostomy tube was replaced by ENT, Dr. Pryor Ochoa after dilation of the existing fistula.  She has subsequently stabilized.  Has been ready for discharge to skilled nursing facility for many days now,  however no SNF bed has been available.    11/10 waiting for SNF.  Difficult placement due to trach and PEG  11/10 continue Flonase  Assessment & Plan:   Active Problems:   Tracheostomy status (HCC)   Acute respiratory failure (HCC)   PEG (percutaneous endoscopic gastrostomy) status ([place])   Essential hypertension   Bilateral vocal cord paralysis  Acute respiratory failure with hypoxemia. Status post tracheostomy and PEG. Bilateral vocal cord paralysis. Patient still has significant postnasal draining, requiring suctioning from tracheostomy tube.  Otherwise she is doing well, oxygenation stable.   11/9 continue flonase  Mild leukopenia. Mild thrombocytopenia  11/10 stable    DVT prophylaxis: Lovenox Code Status: full Family Communication:  Disposition Plan:      Status is: Inpatient   Remains inpatient appropriate because: Unsafe discharge.  Patient has a bed offer from nursing home, currently pending availability of a bed.              Subjective: No shortness of breath or chest pain  Objective: Vitals:   02/04/21 2031 02/05/21 0500 02/05/21 0510 02/05/21 0739  BP: (!) 149/87  97/63 127/81  Pulse: 82  60 87  Resp: 18  18 16   Temp: 99.1  F (37.3 C)  97.6 F (36.4 C) 98.6 F (37 C)  TempSrc:    Oral  SpO2: 100%  100% 97%  Weight:  62 kg    Height:       No intake or output data in the 24 hours ending 02/05/21 0835 Filed Weights   02/03/21 0500 02/04/21 0500 02/05/21 0500  Weight: 60.1 kg 60 kg 62 kg    Examination: NAD, calm CTA no wheeze Regular S1-S2 no gallops Soft benign PEG in place No edema Alert and oriented  Data Reviewed: I have personally reviewed following labs and imaging studies  CBC: Recent Labs  Lab 01/31/21 0418  WBC 3.9*  NEUTROABS 1.8  HGB 9.9*  HCT 32.3*  MCV 83.0  PLT 332*   Basic Metabolic Panel: Recent Labs  Lab 01/31/21 0418  NA 141  K 3.8  CL 102  CO2 34*  GLUCOSE 85  BUN 26*  CREATININE 0.51  CALCIUM 9.4  MG 2.4   GFR: Estimated Creatinine Clearance: 67.4 mL/min (by C-G formula based on SCr of 0.51 mg/dL). Liver Function Tests: No results for input(s): AST, ALT, ALKPHOS, BILITOT, PROT, ALBUMIN in the last 168 hours. No results for input(s): LIPASE, AMYLASE in the last 168 hours. No results for input(s): AMMONIA in the last 168 hours. Coagulation Profile: No results for input(s): INR, PROTIME in the last 168 hours. Cardiac Enzymes: No results for input(s): CKTOTAL, CKMB, CKMBINDEX, TROPONINI in the last 168 hours. BNP (last 3 results) No results for input(s): PROBNP in the last 8760 hours. HbA1C: No results for  input(s): HGBA1C in the last 72 hours. CBG: Recent Labs  Lab 02/02/21 0842 02/02/21 1144 02/03/21 0529 02/04/21 0537 02/05/21 0508  GLUCAP 93 176* 75 88 88   Lipid Profile: No results for input(s): CHOL, HDL, LDLCALC, TRIG, CHOLHDL, LDLDIRECT in the last 72 hours. Thyroid Function Tests: No results for input(s): TSH, T4TOTAL, FREET4, T3FREE, THYROIDAB in the last 72 hours. Anemia Panel: No results for input(s): VITAMINB12, FOLATE, FERRITIN, TIBC, IRON, RETICCTPCT in the last 72 hours. Sepsis Labs: No results for input(s): PROCALCITON,  LATICACIDVEN in the last 168 hours.  No results found for this or any previous visit (from the past 240 hour(s)).       Radiology Studies: No results found.      Scheduled Meds:  amLODipine  5 mg Per Tube Daily   Chlorhexidine Gluconate Cloth  6 each Topical Daily   enoxaparin (LOVENOX) injection  40 mg Subcutaneous Q24H   feeding supplement (OSMOLITE 1.2 CAL)  355 mL Per Tube 5 X Daily   fluticasone  1 spray Each Nare BID   free water  150 mL Per Tube Q6H   guaiFENesin  10 mL Per Tube Q8H   levETIRAcetam  750 mg Per Tube BID   levothyroxine  100 mcg Per Tube Q0600   polyethylene glycol  17 g Per Tube BID   QUEtiapine  50 mg Per Tube BID   senna  1 tablet Per Tube Daily   Continuous Infusions:   LOS: 53 days    Time spent: 15 minutes with more than 50% on [place], MD [facility]   To contact the attending provider between 7A-7P or the covering provider during after hours 7P-7A, please log into the web site [email or web address] and access using universal [place] password for that web site. If you do not have the password, please call the hospital operator.  02/05/2021, 8:35 AM

## 2021-02-05 NOTE — Progress Notes (Signed)
Patient's airway was suctioned through her tracheostomy. There was a small amount of white thick secretions. Patient had no issues with suctioning.

## 2021-02-05 NOTE — TOC Progression Note (Signed)
Transition of Care [facility]) - Progression Note    Patient Details  Name: Leeyah Heather MRN: [id number] Date of Birth: 02-22-1956  Transition of Care [facility]) CM/SW Contact  [name], RN Phone Number: 02/05/2021, 1:00 PM  Clinical Narrative:    [facility].  Debbie with [facility] is going to submit for authorization through [facility] plan, patient will transition to LTC Medicaid at facility.    Expected Discharge Plan: [place] Barriers to Discharge: No SNF bed  Expected Discharge Plan and Services Expected Discharge Plan: [place]   Discharge Planning Services: CM Consult Post Acute Care Choice: [place] Living arrangements for the past 2 months: [place]                 DME Arranged: N/A DME Agency: NA       HH Arranged: NA HH Agency: NA         Social Determinants of Health (SDOH) Interventions    Readmission Risk Interventions Readmission Risk Prevention Plan 12/31/2020  Transportation Screening Complete  Medication Review Press photographer) Complete  PCP or Specialist appointment within 3-5 days of discharge Complete  HRI or Home Care Consult Complete  SW Recovery Care/Counseling Consult Complete  Palliative Care Screening Not [place] Complete

## 2021-02-05 NOTE — Progress Notes (Signed)
Patient's airway was suctioned. There was a small amount of tan/ white thick secretions. Patient had no issues with suctioning her airway.

## 2021-02-05 NOTE — Progress Notes (Signed)
Physical Therapy Treatment Patient Details Name: Stacy Moran MRN: [id number] DOB: [birth year] Today's Date: 02/05/2021   History of Present Illness Stacy Moran is a 65 y/o female presented to the ED  from her SNF due to respiratory distress after her tracheostomy tube apparently became dislodged.  History of ruptured intracranial aneurysm and subsequent SHD/SAH, bilateral true vocal cord paralysis, chronic tracheostomy and PEG, HTN, epilepsy, hypothyroidism, and VP shunt for recurrent hydrocephalus, anxiety. Difficulty placement leading to prolonged hospital stay.    PT Comments    Pt in room at EOB on entry, no supplemental O2 donned, pt reports removing for AMB to BR earlier, but difficulty saying how long she has been off, received at 93%. Pt agreeable to session, demonstrates transfers and AMB in room without device, then AMB in hallway, around unit on room air at 94%, tachycardia into 120s, mild dyspnea at end of AMB. Pt declines additional AMB at end of session, pt left on RA at EOS, RN made aware.   Recommendations for follow up therapy are one component of a multi-disciplinary discharge planning process, led by the attending physician.  Recommendations may be updated based on patient status, additional functional criteria and insurance authorization.  Follow Up Recommendations  Follow physician's recommendations for discharge plan and follow up therapies     Assistance Recommended at Discharge Intermittent Supervision/Assistance  Equipment Recommendations  None recommended by PT    Recommendations for Other Services       Precautions / Restrictions Precautions Precautions: None Restrictions Weight Bearing Restrictions: No     Mobility  Bed Mobility               General bed mobility comments: seated EOB at entry, has been mobilizing from lying down to EOB ad lib with 4 rails up. RN aware    Transfers   Equipment used: None Transfers: Sit to/from Stand Sit to  Stand: Modified independent (Device/Increase time)                Ambulation/Gait Ambulation/Gait assistance: Supervision Gait Distance (Feet): 220 Feet Assistive device: Rolling walker (2 wheels);None Gait Pattern/deviations: WFL(Within Functional Limits) Gait velocity: 0.39m/s; room air, SpO2: 93-94%, HR 120s bpm.         Stairs             Wheelchair Mobility    Modified Rankin (Stroke Patients Only)       Balance Overall balance assessment: Needs assistance Sitting-balance support: Feet supported;No upper extremity supported Sitting balance-Leahy Scale: Good     Standing balance support: During functional activity Standing balance-Leahy Scale: Good Standing balance comment: single limb stance without UE support < 5 seconds                            Cognition Arousal/Alertness: Awake/alert Behavior During Therapy: WFL for tasks assessed/performed Overall Cognitive Status: Within Functional Limits for tasks assessed                                 General Comments: diufficult to understand at times due to hypophonia/dusarthria; pt seems to have some impaired insight at times.        Exercises      General Comments        Pertinent Vitals/Pain      Home Living  Prior Function            PT Goals (current goals can now be found in the care plan section) Acute Rehab PT Goals Patient Stated Goal: to get her strength back PT Goal Formulation: With patient Time For Goal Achievement: 02/09/21 Potential to Achieve Goals: Good Additional Goals Additional Goal #1: The patient will demonstrate a BERG score of at least 50 to indicate a low fall risk. Progress towards PT goals: Progressing toward goals    Frequency    Min 2X/week      PT Plan Current plan remains appropriate    Co-evaluation              AM-PAC PT "6 Clicks" Mobility   Outcome Measure  Help needed  turning from your back to your side while in a flat bed without using bedrails?: None Help needed moving from lying on your back to sitting on the side of a flat bed without using bedrails?: None Help needed moving to and from a bed to a chair (including a wheelchair)?: None Help needed standing up from a chair using your arms (e.g., wheelchair or bedside chair)?: None Help needed to walk in hospital room?: None Help needed climbing 3-5 steps with a railing? : A Little 6 Click Score: 23    End of Session Equipment Utilized During Treatment: Gait belt Activity Tolerance: Patient tolerated treatment well;No increased pain Patient left: with call bell/phone within reach;in bed Nurse Communication: Mobility status PT Visit Diagnosis: Other abnormalities of gait and mobility (R26.89);Muscle weakness (generalized) (M62.81)     Time: 2751-7001 PT Time Calculation (min) (ACUTE ONLY): 10 min  Charges:  $Therapeutic Activity: 8-22 mins                    12:42 PM, 02/05/21 Etta Grandchild, PT, DPT Physical Therapist - [facility]  [phone] ([place])    [place] [facility] 02/05/2021, 12:40 PM

## 2021-02-06 DIAGNOSIS — J3802 Paralysis of vocal cords and larynx, bilateral: Secondary | ICD-10-CM | POA: Diagnosis not present

## 2021-02-06 DIAGNOSIS — Z931 Gastrostomy status: Secondary | ICD-10-CM | POA: Diagnosis not present

## 2021-02-06 DIAGNOSIS — I10 Essential (primary) hypertension: Secondary | ICD-10-CM | POA: Diagnosis not present

## 2021-02-06 DIAGNOSIS — Z930 Tracheostomy status: Secondary | ICD-10-CM | POA: Diagnosis not present

## 2021-02-06 NOTE — Progress Notes (Signed)
Physical Therapy Treatment Patient Details Name: Stacy Moran MRN: [id number] DOB: [birth year] Today's Date: 02/06/2021   History of Present Illness Stacy Moran is a 65 y/o female presented to the ED  from her SNF due to respiratory distress after her tracheostomy tube apparently became dislodged.  History of ruptured intracranial aneurysm and subsequent SHD/SAH, bilateral true vocal cord paralysis, chronic tracheostomy and PEG, HTN, epilepsy, hypothyroidism, and VP shunt for recurrent hydrocephalus, anxiety. Difficulty placement leading to prolonged hospital stay.    PT Comments    Patient alert, headed into bathroom upon PT entrance. Pt prefers to furniture walk, and encouraged to use RW as able. She was able to increase her ambulation distance (~486ft) with RW, cued for head turns to challenge balance. Also ambulated on room air, spO2  >90% throughout, HR in 120s. The patient has demonstrated improvements with PT, and would benefit from continued therapy to address higher level balance deficits.      Recommendations for follow up therapy are one component of a multi-disciplinary discharge planning process, led by the attending physician.  Recommendations may be updated based on patient status, additional functional criteria and insurance authorization.  Follow Up Recommendations  Follow physician's recommendations for discharge plan and follow up therapies     Assistance Recommended at Discharge Intermittent Supervision/Assistance  Equipment Recommendations  None recommended by PT    Recommendations for Other Services       Precautions / Restrictions Precautions Precautions: None Restrictions Weight Bearing Restrictions: No     Mobility  Bed Mobility                    Transfers Overall transfer level: Modified independent Equipment used: None Transfers: Sit to/from Stand Sit to Stand: Modified independent (Device/Increase time)                 Ambulation/Gait Ambulation/Gait assistance: Supervision Gait Distance (Feet): 400 Feet Assistive device: Rolling walker (2 wheels)         General Gait Details: MOD I w/ proper RW usage, but pt prefers to utilize walls, supv without AD   Stairs             Wheelchair Mobility    Modified Rankin (Stroke Patients Only)       Balance Overall balance assessment: Needs assistance Sitting-balance support: Feet supported;No upper extremity supported Sitting balance-Leahy Scale: Good     Standing balance support: During functional activity Standing balance-Leahy Scale: Good                              Cognition Arousal/Alertness: Awake/alert Behavior During Therapy: WFL for tasks assessed/performed Overall Cognitive Status: Within Functional Limits for tasks assessed                                 General Comments: diufficult to understand at times due to hypophonia/dysarthria; pt seems to have some impaired insight at times.        Exercises      General Comments        Pertinent Vitals/Pain Pain Assessment: No/denies pain    Home Living                          Prior Function            PT Goals (current goals can now be found in  the care plan section) Progress towards PT goals: Progressing toward goals    Frequency    Min 2X/week      PT Plan Current plan remains appropriate    Co-evaluation              AM-PAC PT "6 Clicks" Mobility   Outcome Measure  Help needed turning from your back to your side while in a flat bed without using bedrails?: None Help needed moving from lying on your back to sitting on the side of a flat bed without using bedrails?: None Help needed moving to and from a bed to a chair (including a wheelchair)?: None Help needed standing up from a chair using your arms (e.g., wheelchair or bedside chair)?: None Help needed to walk in hospital room?: None Help needed  climbing 3-5 steps with a railing? : A Little 6 Click Score: 23    End of Session Equipment Utilized During Treatment: Gait belt Activity Tolerance: Patient tolerated treatment well;No increased pain Patient left: with call bell/phone within reach;in bed Nurse Communication: Mobility status PT Visit Diagnosis: Other abnormalities of gait and mobility (R26.89);Muscle weakness (generalized) (M62.81)     Time: 3435-6861 PT Time Calculation (min) (ACUTE ONLY): 18 min  Charges:  $Therapeutic Exercise: 8-22 mins                     Lieutenant Diego PT, DPT 11:53 AM,02/06/21

## 2021-02-06 NOTE — Progress Notes (Signed)
PROGRESS NOTE    Stacy Moran  UDJ:[id number] DOB: 12/05/1955 DOA: 12/14/2020 PCP: [name], MD    Brief Narrative:   [phone] with a history of ruptured intracranial aneurysm and subsequent SHD/SAH, bilateral true vocal cord paralysis, chronic tracheostomy and PEG, HTN, epilepsy, hypothyroidism, and VP shunt for recurrent hydrocephalus who presented from her SNF due to respiratory distress after her tracheostomy tube apparently became dislodged.   On arrival, she was emergently taken to the OR, intubated, and her tracheostomy tube was replaced by ENT, Dr. Pryor Ochoa after dilation of the existing fistula.  She has subsequently stabilized.  Has been ready for discharge to skilled nursing facility for many days now,  however no SNF bed has been available.    11/10 waiting for SNF.  Difficult placement due to trach and PEG  11/11 no overnight issues  Assessment & Plan:   Active Problems:   Tracheostomy status (HCC)   Acute respiratory failure (HCC)   PEG (percutaneous endoscopic gastrostomy) status ([place])   Essential hypertension   Bilateral vocal cord paralysis  Acute respiratory failure with hypoxemia. Status post tracheostomy and PEG. Bilateral vocal cord paralysis. Patient still has significant postnasal draining, requiring suctioning from tracheostomy tube.  Otherwise she is doing well, oxygenation stable.   11/11 continue Flonase  Mild leukopenia. Mild thrombocytopenia  11/11 stable    DVT prophylaxis: Lovenox Code Status: full Family Communication:  Disposition Plan:      Status is: Inpatient   Remains inpatient appropriate because: Unsafe discharge.  Patient has a bed offer from nursing home, currently pending availability of a bed.              Subjective: No shortness of breath.  No chest pain.  Objective: Vitals:   02/05/21 2117 02/05/21 2349 02/06/21 0424 02/06/21 0500  BP:  121/80 (!) 128/94   Pulse:  91 70   Resp:  14 14   Temp:  99.5  F (37.5 C) 98.2 F (36.8 C)   TempSrc:  Oral Oral   SpO2: 100% 100% 100%   Weight:    62 kg  Height:       No intake or output data in the 24 hours ending 02/06/21 0821 Filed Weights   02/04/21 0500 02/05/21 0500 02/06/21 0500  Weight: 60 kg 62 kg 62 kg    Examination: Calm, NAD Coarse breath sounds no wheezing  regular S1-S2 no gallops Soft benign positive bowel No edema [id number]   Data Reviewed: I have personally reviewed following labs and imaging studies  CBC: Recent Labs  Lab 01/31/21 0418  WBC 3.9*  NEUTROABS 1.8  HGB 9.9*  HCT 32.3*  MCV 83.0  PLT 027*   Basic Metabolic Panel: Recent Labs  Lab 01/31/21 0418  NA 141  K 3.8  CL 102  CO2 34*  GLUCOSE 85  BUN 26*  CREATININE 0.51  CALCIUM 9.4  MG 2.4   GFR: Estimated Creatinine Clearance: 67.4 mL/min (by C-G formula based on SCr of 0.51 mg/dL). Liver Function Tests: No results for input(s): AST, ALT, ALKPHOS, BILITOT, PROT, ALBUMIN in the last 168 hours. No results for input(s): LIPASE, AMYLASE in the last 168 hours. No results for input(s): AMMONIA in the last 168 hours. Coagulation Profile: No results for input(s): INR, PROTIME in the last 168 hours. Cardiac Enzymes: No results for input(s): CKTOTAL, CKMB, CKMBINDEX, TROPONINI in the last 168 hours. BNP (last 3 results) No results for input(s): PROBNP in the last 8760 hours. HbA1C: No results for  input(s): HGBA1C in the last 72 hours. CBG: Recent Labs  Lab 02/02/21 0842 02/02/21 1144 02/03/21 0529 02/04/21 0537 02/05/21 0508  GLUCAP 93 176* 75 88 88   Lipid Profile: No results for input(s): CHOL, HDL, LDLCALC, TRIG, CHOLHDL, LDLDIRECT in the last 72 hours. Thyroid Function Tests: No results for input(s): TSH, T4TOTAL, FREET4, T3FREE, THYROIDAB in the last 72 hours. Anemia Panel: No results for input(s): VITAMINB12, FOLATE, FERRITIN, TIBC, IRON, RETICCTPCT in the last 72 hours. Sepsis Labs: No results for input(s): PROCALCITON,  LATICACIDVEN in the last 168 hours.  No results found for this or any previous visit (from the past 240 hour(s)).       Radiology Studies: No results found.      Scheduled Meds:  amLODipine  5 mg Per Tube Daily   Chlorhexidine Gluconate Cloth  6 each Topical Daily   enoxaparin (LOVENOX) injection  40 mg Subcutaneous Q24H   feeding supplement (OSMOLITE 1.2 CAL)  355 mL Per Tube 5 X Daily   fluticasone  1 spray Each Nare BID   free water  150 mL Per Tube Q6H   glycopyrrolate  1 mg Per Tube Daily   guaiFENesin  10 mL Per Tube Q8H   levETIRAcetam  750 mg Per Tube BID   levothyroxine  100 mcg Per Tube Q0600   polyethylene glycol  17 g Per Tube BID   QUEtiapine  50 mg Per Tube BID   senna  1 tablet Per Tube Daily   Continuous Infusions:   LOS: 54 days    Time spent: 15 minutes with more than 50% on [place], MD [facility]   To contact the attending provider between 7A-7P or the covering provider during after hours 7P-7A, please log into the web site [email or web address] and access using universal [place] password for that web site. If you do not have the password, please call the hospital operator.  02/06/2021, 8:21 AM

## 2021-02-07 DIAGNOSIS — J3802 Paralysis of vocal cords and larynx, bilateral: Secondary | ICD-10-CM | POA: Diagnosis not present

## 2021-02-07 DIAGNOSIS — Z931 Gastrostomy status: Secondary | ICD-10-CM | POA: Diagnosis not present

## 2021-02-07 DIAGNOSIS — J9600 Acute respiratory failure, unspecified whether with hypoxia or hypercapnia: Secondary | ICD-10-CM | POA: Diagnosis not present

## 2021-02-07 DIAGNOSIS — I10 Essential (primary) hypertension: Secondary | ICD-10-CM | POA: Diagnosis not present

## 2021-02-07 LAB — GLUCOSE, CAPILLARY: Glucose-Capillary: 102 mg/dL — ABNORMAL HIGH (ref 70–99)

## 2021-02-07 NOTE — Progress Notes (Signed)
PROGRESS NOTE    Stacy Moran  AQT:[id number] DOB: 1955-10-31 DOA: 12/14/2020 PCP: [name], MD    Brief Narrative:   [phone] with a history of ruptured intracranial aneurysm and subsequent SHD/SAH, bilateral true vocal cord paralysis, chronic tracheostomy and PEG, HTN, epilepsy, hypothyroidism, and VP shunt for recurrent hydrocephalus who presented from her SNF due to respiratory distress after her tracheostomy tube apparently became dislodged.   On arrival, she was emergently taken to the OR, intubated, and her tracheostomy tube was replaced by ENT, Dr. Pryor Ochoa after dilation of the existing fistula.  She has subsequently stabilized.  Has been ready for discharge to skilled nursing facility for many days now,  however no SNF bed has been available.    11/10 waiting for SNF.  Difficult placement due to trach and PEG  11/12 no overnight issues.  Spoke to RT reports Robinul helping keep the continued current dose  Assessment & Plan:   Active Problems:   Tracheostomy status (HCC)   Acute respiratory failure (HCC)   PEG (percutaneous endoscopic gastrostomy) status ([place])   Essential hypertension   Bilateral vocal cord paralysis  Acute respiratory failure with hypoxemia. Status post tracheostomy and PEG. Bilateral vocal cord paralysis. Patient still has significant postnasal draining, requiring suctioning from tracheostomy tube.  Otherwise she is doing well, oxygenation stable.   11/12 continue Flonase  Mild leukopenia. Mild thrombocytopenia  11/12 stable    DVT prophylaxis: Lovenox Code Status: full Family Communication: None at bedside Disposition Plan: To be determined     Status is: Inpatient   Remains inpatient appropriate because: Unsafe discharge.  Patient has a bed offer from nursing home, currently pending availability of a bed.              Subjective: Patient is sleepy this AM.  Does not really participate in physical  exam  Objective: Vitals:   02/07/21 0122 02/07/21 0407 02/07/21 0500 02/07/21 0721  BP: 106/64 111/74  107/71  Pulse: 93 73  75  Resp: 15 16  14   Temp: 98.6 F (37 C) 97.7 F (36.5 C)  (!) 97.5 F (36.4 C)  TempSrc: Oral Oral    SpO2: 100% 97%  100%  Weight:   62.4 kg   Height:        Intake/Output Summary (Last 24 hours) at 02/07/2021 0824 Last data filed at 02/07/2021 0525 Gross per 24 hour  Intake 71384.83 ml  Output --  Net 71384.83 ml   Filed Weights   02/05/21 0500 02/06/21 0500 02/07/21 0500  Weight: 62 kg 62 kg 62.4 kg    Examination: Calm, NAD, sleepy Bronchial breath sounds no wheezing Regular S1-S2 no gallops Soft benign positive bowel sounds No edema   Data Reviewed: I have personally reviewed following labs and imaging studies  CBC: No results for input(s): WBC, NEUTROABS, HGB, HCT, MCV, PLT in the last 168 hours.  Basic Metabolic Panel: No results for input(s): NA, K, CL, CO2, GLUCOSE, BUN, CREATININE, CALCIUM, MG, PHOS in the last 168 hours.  GFR: Estimated Creatinine Clearance: 67.4 mL/min (by C-G formula based on SCr of 0.51 mg/dL). Liver Function Tests: No results for input(s): AST, ALT, ALKPHOS, BILITOT, PROT, ALBUMIN in the last 168 hours. No results for input(s): LIPASE, AMYLASE in the last 168 hours. No results for input(s): AMMONIA in the last 168 hours. Coagulation Profile: No results for input(s): INR, PROTIME in the last 168 hours. Cardiac Enzymes: No results for input(s): CKTOTAL, CKMB, CKMBINDEX, TROPONINI in the last 168  hours. BNP (last 3 results) No results for input(s): PROBNP in the last 8760 hours. HbA1C: No results for input(s): HGBA1C in the last 72 hours. CBG: Recent Labs  Lab 02/02/21 1144 02/03/21 0529 02/04/21 0537 02/05/21 0508 02/07/21 0412  GLUCAP 176* 75 88 88 102*   Lipid Profile: No results for input(s): CHOL, HDL, LDLCALC, TRIG, CHOLHDL, LDLDIRECT in the last 72 hours. Thyroid Function Tests: No  results for input(s): TSH, T4TOTAL, FREET4, T3FREE, THYROIDAB in the last 72 hours. Anemia Panel: No results for input(s): VITAMINB12, FOLATE, FERRITIN, TIBC, IRON, RETICCTPCT in the last 72 hours. Sepsis Labs: No results for input(s): PROCALCITON, LATICACIDVEN in the last 168 hours.  No results found for this or any previous visit (from the past 240 hour(s)).       Radiology Studies: No results found.      Scheduled Meds:  amLODipine  5 mg Per Tube Daily   Chlorhexidine Gluconate Cloth  6 each Topical Daily   enoxaparin (LOVENOX) injection  40 mg Subcutaneous Q24H   feeding supplement (OSMOLITE 1.2 CAL)  355 mL Per Tube 5 X Daily   fluticasone  1 spray Each Nare BID   free water  150 mL Per Tube Q6H   glycopyrrolate  1 mg Per Tube Daily   guaiFENesin  10 mL Per Tube Q8H   levETIRAcetam  750 mg Per Tube BID   levothyroxine  100 mcg Per Tube Q0600   polyethylene glycol  17 g Per Tube BID   QUEtiapine  50 mg Per Tube BID   senna  1 tablet Per Tube Daily   Continuous Infusions:   LOS: 55 days    Time spent: 15 minutes with more than 50% on [place], MD [facility]   To contact the attending provider between 7A-7P or the covering provider during after hours 7P-7A, please log into the web site [email or web address] and access using universal [place] password for that web site. If you do not have the password, please call the hospital operator.  02/07/2021, 8:24 AM

## 2021-02-08 DIAGNOSIS — J9600 Acute respiratory failure, unspecified whether with hypoxia or hypercapnia: Secondary | ICD-10-CM | POA: Diagnosis not present

## 2021-02-08 DIAGNOSIS — Z930 Tracheostomy status: Secondary | ICD-10-CM | POA: Diagnosis not present

## 2021-02-08 DIAGNOSIS — Z931 Gastrostomy status: Secondary | ICD-10-CM | POA: Diagnosis not present

## 2021-02-08 DIAGNOSIS — I10 Essential (primary) hypertension: Secondary | ICD-10-CM | POA: Diagnosis not present

## 2021-02-08 LAB — GLUCOSE, CAPILLARY: Glucose-Capillary: 97 mg/dL (ref 70–99)

## 2021-02-08 MED ORDER — GUAIFENESIN 100 MG/5ML PO LIQD
10.0000 mL | Freq: Three times a day (TID) | ORAL | Status: DC
  Filled 2021-02-08: qty 10

## 2021-02-08 MED ORDER — GUAIFENESIN 100 MG/5ML PO LIQD
10.0000 mL | Freq: Three times a day (TID) | ORAL | Status: DC
  Administered 2021-02-09 – 2021-02-18 (×28): 10 mL
  Filled 2021-02-08 (×28): qty 10

## 2021-02-08 MED ORDER — GUAIFENESIN 100 MG/5ML PO LIQD
10.0000 mL | Freq: Three times a day (TID) | ORAL | Status: DC
  Administered 2021-02-08: 23:00:00 10 mL via ORAL
  Filled 2021-02-08 (×2): qty 10

## 2021-02-08 MED ORDER — GUAIFENESIN 100 MG/5ML PO LIQD
5.0000 mL | Freq: Three times a day (TID) | ORAL | Status: DC
  Filled 2021-02-08 (×2): qty 5

## 2021-02-08 NOTE — Progress Notes (Signed)
PROGRESS NOTE    Stacy Moran  JSE:[id number] DOB: 26-Sep-1955 DOA: 12/14/2020 PCP: [name], MD    Brief Narrative:   [phone] with a history of ruptured intracranial aneurysm and subsequent SHD/SAH, bilateral true vocal cord paralysis, chronic tracheostomy and PEG, HTN, epilepsy, hypothyroidism, and VP shunt for recurrent hydrocephalus who presented from her SNF due to respiratory distress after her tracheostomy tube apparently became dislodged.   On arrival, she was emergently taken to the OR, intubated, and her tracheostomy tube was replaced by ENT, Dr. Pryor Ochoa after dilation of the existing fistula.  She has subsequently stabilized.  Has been ready for discharge to skilled nursing facility for many days now,  however no SNF bed has been available.    11/10 waiting for SNF.  Difficult placement due to trach and PEG  11/12 no overnight issues.  Spoke to RT reports Robinul helping keep the continued current dose 11/13 no overnight issues  Assessment & Plan:   Active Problems:   Tracheostomy status (HCC)   Acute respiratory failure (HCC)   PEG (percutaneous endoscopic gastrostomy) status ([place])   Essential hypertension   Bilateral vocal cord paralysis  Acute respiratory failure with hypoxemia. Status post tracheostomy and PEG. Bilateral vocal cord paralysis. Patient still has significant postnasal draining, requiring suctioning from tracheostomy tube.  Otherwise she is doing well, oxygenation stable.   11/13 continue Flonase   Mild leukopenia. Mild thrombocytopenia  11/13 stable    DVT prophylaxis: Lovenox Code Status: full Family Communication: None at bedside Disposition Plan: To be determined     Status is: Inpatient   Remains inpatient appropriate because: Unsafe discharge.  Patient has a bed offer from nursing home, currently pending availability of a bed.              Subjective: Has no complaints this AM.  Decreased coughing no shortness of  breath no chest pain  Objective: Vitals:   02/08/21 0003 02/08/21 0500 02/08/21 0519 02/08/21 0735  BP:   105/66 110/73  Pulse:   69 73  Resp:   16 15  Temp:   98 F (36.7 C) 98.3 F (36.8 C)  TempSrc:   Oral Oral  SpO2: 100%  100% 100%  Weight:  62.9 kg    Height:        Intake/Output Summary (Last 24 hours) at 02/08/2021 0818 Last data filed at 02/08/2021 0200 Gross per 24 hour  Intake 805 ml  Output --  Net 805 ml   Filed Weights   02/06/21 0500 02/07/21 0500 02/08/21 0500  Weight: 62 kg 62.4 kg 62.9 kg    Examination: Calm, NAD Clear to auscultation no wheezing or rhonchi's Regular S1-S2 no gallops Soft benign positive bowel sounds No edema  Data Reviewed: I have personally reviewed following labs and imaging studies  CBC: No results for input(s): WBC, NEUTROABS, HGB, HCT, MCV, PLT in the last 168 hours.  Basic Metabolic Panel: No results for input(s): NA, K, CL, CO2, GLUCOSE, BUN, CREATININE, CALCIUM, MG, PHOS in the last 168 hours.  GFR: Estimated Creatinine Clearance: 67.4 mL/min (by C-G formula based on SCr of 0.51 mg/dL). Liver Function Tests: No results for input(s): AST, ALT, ALKPHOS, BILITOT, PROT, ALBUMIN in the last 168 hours. No results for input(s): LIPASE, AMYLASE in the last 168 hours. No results for input(s): AMMONIA in the last 168 hours. Coagulation Profile: No results for input(s): INR, PROTIME in the last 168 hours. Cardiac Enzymes: No results for input(s): CKTOTAL, CKMB, CKMBINDEX, TROPONINI in  the last 168 hours. BNP (last 3 results) No results for input(s): PROBNP in the last 8760 hours. HbA1C: No results for input(s): HGBA1C in the last 72 hours. CBG: Recent Labs  Lab 02/03/21 0529 02/04/21 0537 02/05/21 0508 02/07/21 0412 02/08/21 0520  GLUCAP 75 88 88 102* 97   Lipid Profile: No results for input(s): CHOL, HDL, LDLCALC, TRIG, CHOLHDL, LDLDIRECT in the last 72 hours. Thyroid Function Tests: No results for input(s):  TSH, T4TOTAL, FREET4, T3FREE, THYROIDAB in the last 72 hours. Anemia Panel: No results for input(s): VITAMINB12, FOLATE, FERRITIN, TIBC, IRON, RETICCTPCT in the last 72 hours. Sepsis Labs: No results for input(s): PROCALCITON, LATICACIDVEN in the last 168 hours.  No results found for this or any previous visit (from the past 240 hour(s)).       Radiology Studies: No results found.      Scheduled Meds:  amLODipine  5 mg Per Tube Daily   Chlorhexidine Gluconate Cloth  6 each Topical Daily   enoxaparin (LOVENOX) injection  40 mg Subcutaneous Q24H   feeding supplement (OSMOLITE 1.2 CAL)  355 mL Per Tube 5 X Daily   fluticasone  1 spray Each Nare BID   free water  150 mL Per Tube Q6H   glycopyrrolate  1 mg Per Tube Daily   guaiFENesin  10 mL Per Tube Q8H   levETIRAcetam  750 mg Per Tube BID   levothyroxine  100 mcg Per Tube Q0600   polyethylene glycol  17 g Per Tube BID   QUEtiapine  50 mg Per Tube BID   senna  1 tablet Per Tube Daily   Continuous Infusions:   LOS: 56 days    Time spent: 15 minutes with more than 50% on COC    Nolberto Hanlon, MD [facility]   To contact the attending provider between 7A-7P or the covering provider during after hours 7P-7A, please log into the web site [email or web address] and access using universal [place] password for that web site. If you do not have the password, please call the hospital operator.  02/08/2021, 8:18 AM

## 2021-02-09 DIAGNOSIS — I10 Essential (primary) hypertension: Secondary | ICD-10-CM | POA: Diagnosis not present

## 2021-02-09 DIAGNOSIS — J3802 Paralysis of vocal cords and larynx, bilateral: Secondary | ICD-10-CM | POA: Diagnosis not present

## 2021-02-09 DIAGNOSIS — J9600 Acute respiratory failure, unspecified whether with hypoxia or hypercapnia: Secondary | ICD-10-CM | POA: Diagnosis not present

## 2021-02-09 LAB — GLUCOSE, CAPILLARY: Glucose-Capillary: 90 mg/dL (ref 70–99)

## 2021-02-09 NOTE — Progress Notes (Signed)
OT Cancellation Note  Patient Details Name: Stacy Moran MRN: [id number] DOB: 1956/01/13   Cancelled Treatment:    Reason Eval/Treat Not Completed: Other (comment). Upon attempt, pt coughing. Requesting her trach be suctioned. RN notified. Pt also reports having just been up to the bathroom. Pt wishes to hold OT today and agreeable to re-attempt tomorrow.   Ardeth Perfect., MPH, MS, OTR/L ascom [phone] 02/09/21, 2:25 PM

## 2021-02-09 NOTE — TOC Progression Note (Signed)
Transition of Care [facility]) - Progression Note    Patient Details  Name: Stacy Moran MRN: [id number] Date of Birth: 1956/03/04  Transition of Care [facility]) CM/SW Contact  [name], RN Phone Number: 02/09/2021, 12:09 PM  Clinical Narrative:    Message left for Debbie x 2 at [facility] for update on patient insurance auth and time frame for placement.   Expected Discharge Plan: [place] Barriers to Discharge: No SNF bed  Expected Discharge Plan and Services Expected Discharge Plan: [place]   Discharge Planning Services: CM Consult Post Acute Care Choice: [place] Living arrangements for the past 2 months: [place]                 DME Arranged: N/A DME Agency: NA       HH Arranged: NA HH Agency: NA         Social Determinants of Health (SDOH) Interventions    Readmission Risk Interventions Readmission Risk Prevention Plan 12/31/2020  Transportation Screening Complete  Medication Review Press photographer) Complete  PCP or Specialist appointment within 3-5 days of discharge Complete  HRI or Home Care Consult Complete  SW Recovery Care/Counseling Consult Complete  Palliative Care Screening Not [place] Complete

## 2021-02-09 NOTE — Progress Notes (Signed)
PROGRESS NOTE    Stacy Moran  HCW:[id number] DOB: 12/04/1955 DOA: 12/14/2020 PCP: [name], MD    Brief Narrative:   [phone] with a history of ruptured intracranial aneurysm and subsequent SHD/SAH, bilateral true vocal cord paralysis, chronic tracheostomy and PEG, HTN, epilepsy, hypothyroidism, and VP shunt for recurrent hydrocephalus who presented from her SNF due to respiratory distress after her tracheostomy tube apparently became dislodged.   On arrival, she was emergently taken to the OR, intubated, and her tracheostomy tube was replaced by ENT, Dr. Pryor Ochoa after dilation of the existing fistula.  She has subsequently stabilized.  Has been ready for discharge to skilled nursing facility for many days now,  however no SNF bed has been available.    11/10 waiting for SNF.  Difficult placement due to trach and PEG  11/12 no overnight issues.  Spoke to RT reports Robinul helping keep the continued current dose 11/14 less mucous drainage. No complaints  Assessment & Plan:   Active Problems:   Tracheostomy status (HCC)   Acute respiratory failure (HCC)   PEG (percutaneous endoscopic gastrostomy) status ([place])   Essential hypertension   Bilateral vocal cord paralysis  Acute respiratory failure with hypoxemia. Status post tracheostomy and PEG. Bilateral vocal cord paralysis. Patient still has significant postnasal draining, requiring suctioning from tracheostomy tube.  Otherwise she is doing well, oxygenation stable.   11/14 continue Flonase   Mild leukopenia. Mild thrombocytopenia  11/14 stable    DVT prophylaxis: Lovenox Code Status: full Family Communication: None at bedside Disposition Plan: To be determined     Status is: Inpatient   Remains inpatient appropriate because: Unsafe discharge.  Patient has a bed offer from nursing home, currently pending availability of a bed.              Subjective: Pain or shortness of breath no new  complain  Objective: Vitals:   02/08/21 2018 02/09/21 0500 02/09/21 0522 02/09/21 0828  BP: (!) 138/91  116/82 (!) 94/55  Pulse: 91  73 75  Resp: 16  16 18   Temp: 99 F (37.2 C)  98.3 F (36.8 C) 98.2 F (36.8 C)  TempSrc: Oral  Oral   SpO2: 90%  100% 100%  Weight:  60.2 kg    Height:        Intake/Output Summary (Last 24 hours) at 02/09/2021 0850 Last data filed at 02/09/2021 0524 Gross per 24 hour  Intake 1460 ml  Output --  Net 1460 ml   Filed Weights   02/07/21 0500 02/08/21 0500 02/09/21 0500  Weight: 62.4 kg 62.9 kg 60.2 kg    Examination: Calm, NAD CTA no wheezing Regular S1-S2 no gallops Soft benign positive bowel sounds No edema   Data Reviewed: I have personally reviewed following labs and imaging studies  CBC: No results for input(s): WBC, NEUTROABS, HGB, HCT, MCV, PLT in the last 168 hours.  Basic Metabolic Panel: No results for input(s): NA, K, CL, CO2, GLUCOSE, BUN, CREATININE, CALCIUM, MG, PHOS in the last 168 hours.  GFR: Estimated Creatinine Clearance: 67.4 mL/min (by C-G formula based on SCr of 0.51 mg/dL). Liver Function Tests: No results for input(s): AST, ALT, ALKPHOS, BILITOT, PROT, ALBUMIN in the last 168 hours. No results for input(s): LIPASE, AMYLASE in the last 168 hours. No results for input(s): AMMONIA in the last 168 hours. Coagulation Profile: No results for input(s): INR, PROTIME in the last 168 hours. Cardiac Enzymes: No results for input(s): CKTOTAL, CKMB, CKMBINDEX, TROPONINI in the last 168  hours. BNP (last 3 results) No results for input(s): PROBNP in the last 8760 hours. HbA1C: No results for input(s): HGBA1C in the last 72 hours. CBG: Recent Labs  Lab 02/04/21 0537 02/05/21 0508 02/07/21 0412 02/08/21 0520 02/09/21 0522  GLUCAP 88 88 102* 97 90   Lipid Profile: No results for input(s): CHOL, HDL, LDLCALC, TRIG, CHOLHDL, LDLDIRECT in the last 72 hours. Thyroid Function Tests: No results for input(s): TSH,  T4TOTAL, FREET4, T3FREE, THYROIDAB in the last 72 hours. Anemia Panel: No results for input(s): VITAMINB12, FOLATE, FERRITIN, TIBC, IRON, RETICCTPCT in the last 72 hours. Sepsis Labs: No results for input(s): PROCALCITON, LATICACIDVEN in the last 168 hours.  No results found for this or any previous visit (from the past 240 hour(s)).       Radiology Studies: No results found.      Scheduled Meds:  amLODipine  5 mg Per Tube Daily   Chlorhexidine Gluconate Cloth  6 each Topical Daily   enoxaparin (LOVENOX) injection  40 mg Subcutaneous Q24H   feeding supplement (OSMOLITE 1.2 CAL)  355 mL Per Tube 5 X Daily   fluticasone  1 spray Each Nare BID   free water  150 mL Per Tube Q6H   glycopyrrolate  1 mg Per Tube Daily   guaiFENesin  10 mL Per Tube Q8H   levETIRAcetam  750 mg Per Tube BID   levothyroxine  100 mcg Per Tube Q0600   polyethylene glycol  17 g Per Tube BID   QUEtiapine  50 mg Per Tube BID   senna  1 tablet Per Tube Daily   Continuous Infusions:   LOS: 57 days    Time spent: 15 minutes with more than 50% on [place], MD [facility]   To contact the attending provider between 7A-7P or the covering provider during after hours 7P-7A, please log into the web site [email or web address] and access using universal [place] password for that web site. If you do not have the password, please call the hospital operator.  02/09/2021, 8:50 AM

## 2021-02-10 DIAGNOSIS — Z930 Tracheostomy status: Secondary | ICD-10-CM | POA: Diagnosis not present

## 2021-02-10 DIAGNOSIS — Z931 Gastrostomy status: Secondary | ICD-10-CM | POA: Diagnosis not present

## 2021-02-10 DIAGNOSIS — J9600 Acute respiratory failure, unspecified whether with hypoxia or hypercapnia: Secondary | ICD-10-CM | POA: Diagnosis not present

## 2021-02-10 DIAGNOSIS — I10 Essential (primary) hypertension: Secondary | ICD-10-CM | POA: Diagnosis not present

## 2021-02-10 LAB — GLUCOSE, CAPILLARY
Glucose-Capillary: 142 mg/dL — ABNORMAL HIGH (ref 70–99)
Glucose-Capillary: 119 mg/dL — ABNORMAL HIGH (ref 70–99)

## 2021-02-10 NOTE — Progress Notes (Signed)
Occupational Therapy Treatment Patient Details Name: Stacy Moran MRN: [id number] DOB: 11-Apr-1955 Today's Date: 02/10/2021   History of present illness Stacy Moran is a 65 y/o female presented to the ED  from her SNF due to respiratory distress after her tracheostomy tube apparently became dislodged.  History of ruptured intracranial aneurysm and subsequent SHD/SAH, bilateral true vocal cord paralysis, chronic tracheostomy and PEG, HTN, epilepsy, hypothyroidism, and VP shunt for recurrent hydrocephalus, anxiety. Difficulty placement leading to prolonged hospital stay.   OT comments  Upon entering the room, pt supine in bed with no c/o pain and reports having washed up prior to therapist arrival. Pt is on RA this session. She dons B socks and shoes without assistance and obtains clean UB clothing. She ambulates to bathroom and sits on commode to don UB clothing without assistance. Pt displays increased safety awareness by performing tasks seated without cuing to do so. Pt standing at sink to don hair wrap and perform grooming tasks without LOB. Pt at mod I level with and without use of RW for functional tasks. No LOB with self care tasks and functional mobility this session. She is in no apparent distress with PMSV donned and being on RA. Pt does request RT to suction and RN notified. Pt has met all 3/3 OT goals and no further skilled OT intervention needed at this time. OT to SIGN OFF.    Recommendations for follow up therapy are one component of a multi-disciplinary discharge planning process, led by the attending physician.  Recommendations may be updated based on patient status, additional functional criteria and insurance authorization.    Follow Up Recommendations  No OT follow up    Assistance Recommended at Discharge PRN  Equipment Recommendations  None recommended by OT       Precautions / Restrictions Precautions Precautions: None       Mobility Bed Mobility Overal bed  mobility: Modified Independent Bed Mobility: Supine to Sit;Sit to Supine     Supine to sit: Modified independent (Device/Increase time)     General bed mobility comments: HOB elevated    Transfers Overall transfer level: Modified independent Equipment used: None Transfers: Sit to/from Stand Sit to Stand: Modified independent (Device/Increase time) Stand pivot transfers: Modified independent (Device/Increase time)               Balance Overall balance assessment: Independent   Sitting balance-Leahy Scale: Normal     Standing balance support: During functional activity Standing balance-Leahy Scale: Good                             ADL either performed or assessed with clinical judgement   ADL Overall ADL's : Modified independent                                       General ADL Comments: mod I with use of RW for toileting, UB/LB dressing, toileting, and standing grooming tasks.    Extremity/Trunk Assessment Upper Extremity Assessment Upper Extremity Assessment: Overall WFL for tasks assessed            Vision Patient Visual Report: No change from baseline            Cognition Arousal/Alertness: Awake/alert Behavior During Therapy: WFL for tasks assessed/performed Overall Cognitive Status: Within Functional Limits for tasks assessed  General Comments: Pt is pleasant and cooperative                     Pertinent Vitals/ Pain       Pain Assessment: No/denies pain         Frequency  Min 2X/week        Progress Toward Goals  OT Goals(current goals can now be found in the care plan section)  Progress towards OT goals: Goals met/education completed, patient discharged from OT  Acute Rehab OT Goals Patient Stated Goal: to get better OT Goal Formulation: With patient Time For Goal Achievement: 02/10/21 Potential to Achieve Goals: [place] Discharge plan remains  appropriate;All goals met and education completed, patient discharged from OT services       AM-PAC OT "6 Clicks" Daily Activity     Outcome Measure   Help from another person eating meals?: None Help from another person taking care of personal grooming?: None Help from another person toileting, which includes using toliet, bedpan, or urinal?: None Help from another person bathing (including washing, rinsing, drying)?: None Help from another person to put on and taking off regular upper body clothing?: None Help from another person to put on and taking off regular lower body clothing?: None 6 Click Score: 24    End of Session Equipment Utilized During Treatment: Rolling walker (2 wheels)  OT Visit Diagnosis: Other symptoms and signs involving cognitive function   Activity Tolerance Patient tolerated treatment well   Patient Left in bed;with call bell/phone within reach   Nurse Communication Other (comment) (Pt requesting RT for suction. OT to SIGN OFF)        Time: 7703-4035 OT Time Calculation (min): 24 min  Charges: OT General Charges $OT Visit: 1 Visit OT Treatments $Self Care/Home Management : 23-37 mins  Darleen Crocker, MS, OTR/L , CBIS ascom [phone]  02/10/21, 11:07 AM

## 2021-02-10 NOTE — Progress Notes (Signed)
PROGRESS NOTE    Stacy Moran  NWG:[id number] DOB: 04/25/1955 DOA: 12/14/2020 PCP: [name], MD    Brief Narrative:   [phone] with a history of ruptured intracranial aneurysm and subsequent SHD/SAH, bilateral true vocal cord paralysis, chronic tracheostomy and PEG, HTN, epilepsy, hypothyroidism, and VP shunt for recurrent hydrocephalus who presented from her SNF due to respiratory distress after her tracheostomy tube apparently became dislodged.   On arrival, she was emergently taken to the OR, intubated, and her tracheostomy tube was replaced by ENT, Dr. Pryor Ochoa after dilation of the existing fistula.  She has subsequently stabilized.  Has been ready for discharge to skilled nursing facility for many days now,  however no SNF bed has been available.    Dr. Lupita Dawn week: Has been waiting for SNF.  Difficult placement due to trach and PEG.   Started on Robinul for increased mucus drainage which has been   Assessment & Plan:   Active Problems:   Tracheostomy status (HCC)   Acute respiratory failure (HCC)   PEG (percutaneous endoscopic gastrostomy) status ([place])   Essential hypertension   Bilateral vocal cord paralysis  Acute respiratory failure with hypoxemia. Status post tracheostomy and PEG. Bilateral vocal cord paralysis. Patient still has significant postnasal draining, requiring suctioning from tracheostomy tube.  Otherwise she is doing well, oxygenation stable.   11/15 continue Flonase    Mild leukopenia. Mild thrombocytopenia  11/15 stable    DVT prophylaxis: Lovenox Code Status: full Family Communication: None at bedside Disposition Plan: To be determined     Status is: Inpatient   Remains inpatient appropriate because: Unsafe discharge.  Patient has a bed offer from nursing home, currently pending availability of a bed.              Subjective: Doing good. No sob, cp  Objective: Vitals:   02/09/21 2159 02/10/21 0528 02/10/21 0541 02/10/21  0827  BP: 135/88  105/73 112/74  Pulse: 83  73 77  Resp: 19  17 18   Temp: 98 F (36.7 C)  97.7 F (36.5 C) 98.6 F (37 C)  TempSrc: Oral  Oral   SpO2: 100%  97% 96%  Weight:  61.7 kg    Height:       No intake or output data in the 24 hours ending 02/10/21 0843  Filed Weights   02/08/21 0500 02/09/21 0500 02/10/21 0528  Weight: 62.9 kg 60.2 kg 61.7 kg    Examination: Calm, nad Cta no w/r Reg s1/s2 no gallop Soft benign +Bs No edema aaxoxo3   Data Reviewed: I have personally reviewed following labs and imaging studies  CBC: No results for input(s): WBC, NEUTROABS, HGB, HCT, MCV, PLT in the last 168 hours.  Basic Metabolic Panel: No results for input(s): NA, K, CL, CO2, GLUCOSE, BUN, CREATININE, CALCIUM, MG, PHOS in the last 168 hours.  GFR: Estimated Creatinine Clearance: 67.4 mL/min (by C-G formula based on SCr of 0.51 mg/dL). Liver Function Tests: No results for input(s): AST, ALT, ALKPHOS, BILITOT, PROT, ALBUMIN in the last 168 hours. No results for input(s): LIPASE, AMYLASE in the last 168 hours. No results for input(s): AMMONIA in the last 168 hours. Coagulation Profile: No results for input(s): INR, PROTIME in the last 168 hours. Cardiac Enzymes: No results for input(s): CKTOTAL, CKMB, CKMBINDEX, TROPONINI in the last 168 hours. BNP (last 3 results) No results for input(s): PROBNP in the last 8760 hours. HbA1C: No results for input(s): HGBA1C in the last 72 hours. CBG: Recent Labs  Lab 02/07/21 0412 02/08/21 0520 02/09/21 0522 02/10/21 0621 02/10/21 0828  GLUCAP 102* 97 90 142* 119*   Lipid Profile: No results for input(s): CHOL, HDL, LDLCALC, TRIG, CHOLHDL, LDLDIRECT in the last 72 hours. Thyroid Function Tests: No results for input(s): TSH, T4TOTAL, FREET4, T3FREE, THYROIDAB in the last 72 hours. Anemia Panel: No results for input(s): VITAMINB12, FOLATE, FERRITIN, TIBC, IRON, RETICCTPCT in the last 72 hours. Sepsis Labs: No results for  input(s): PROCALCITON, LATICACIDVEN in the last 168 hours.  No results found for this or any previous visit (from the past 240 hour(s)).       Radiology Studies: No results found.      Scheduled Meds:  amLODipine  5 mg Per Tube Daily   Chlorhexidine Gluconate Cloth  6 each Topical Daily   enoxaparin (LOVENOX) injection  40 mg Subcutaneous Q24H   feeding supplement (OSMOLITE 1.2 CAL)  355 mL Per Tube 5 X Daily   fluticasone  1 spray Each Nare BID   free water  150 mL Per Tube Q6H   glycopyrrolate  1 mg Per Tube Daily   guaiFENesin  10 mL Per Tube Q8H   levETIRAcetam  750 mg Per Tube BID   levothyroxine  100 mcg Per Tube Q0600   polyethylene glycol  17 g Per Tube BID   QUEtiapine  50 mg Per Tube BID   senna  1 tablet Per Tube Daily   Continuous Infusions:   LOS: 58 days    Time spent: 15 minutes with more than 50% on COC    Nolberto Hanlon, MD [facility]   To contact the attending provider between 7A-7P or the covering provider during after hours 7P-7A, please log into the web site [email or web address] and access using universal [facility] password for that web site. If you do not have the password, please call the hospital operator.  02/10/2021, 8:43 AM

## 2021-02-10 NOTE — Progress Notes (Signed)
Nutrition Follow-up  DOCUMENTATION CODES:   Severe malnutrition in context of chronic illness  INTERVENTION:   Continue TF via PEG. 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  Ongoing  GOAL:   Patient will meet greater than or equal to 90% of their needs  Met with TF  MONITOR:   TF tolerance, Labs, Weight trends, Vent status, I & O's  REASON FOR ASSESSMENT:   Consult Enteral/tube feeding initiation and management  ASSESSMENT:   [age] female with PMH of vocal cord paralysis with chronic tracheostomy, HTN, hx subarachnoid hemorrhage, chronic PEG tube, anxiety, and ventriculoperitoneal shunt for recurrent hydrocephalus presented to ED from [facility] respiratory distress. Patient was found in respiratory distress in her room with a tracheostomy tube on the floor.  Reviewed I/O's: +12.3 L x 24 hours   Pt remains medically stable for discharge. TOC continues to work to locate placement. She remains difficult to place secondary to PEG and trach.    She continues to tolerate TF well.     Reviewed wt hx; wt has been stable over the past month.    Medications reviewed and include keppra, senokot,  and miralax.   Labs reviewed: CBGS: 90-142.   Diet Order:   Diet Order             Diet NPO time specified  Diet effective now                   EDUCATION NEEDS:   Education needs have been addressed  Skin:  Skin Assessment: Reviewed RN Assessment  Last BM:  02/02/21  Height:   Ht Readings from Last 1 Encounters:  12/14/20 _0  (1.676 m)    Weight:   Wt Readings from Last 1 Encounters:  02/10/21 61.7 kg     Ideal Body Weight:  59.1 kg  BMI:  Body mass index is 21.95 kg/m.  Estimated Nutritional Needs:   Kcal:  1800-2000 kcal  Protein:  90-100 g/d  Fluid:  1700-1900 mL/d    JenLoistine ChanceD, LDN, CDCTrainergistered Dietitian II Certified Diabetes Care and Education Specialist Please refer to [facility] RD and/or RD on-call/weekend/after hours pager

## 2021-02-10 NOTE — Progress Notes (Signed)
Physical Therapy Treatment Patient Details Name: Stacy Moran MRN: [id number] DOB: 1955-05-12 Today's Date: 02/10/2021   History of Present Illness Stacy Moran is a 65 y/o female presented to the ED  from her SNF due to respiratory distress after her tracheostomy tube apparently became dislodged.  History of ruptured intracranial aneurysm and subsequent SHD/SAH, bilateral true vocal cord paralysis, chronic tracheostomy and PEG, HTN, epilepsy, hypothyroidism, and VP shunt for recurrent hydrocephalus, anxiety. Difficulty placement leading to prolonged hospital stay.    PT Comments    Patient easily wakes, agreeable to therapy. The patient was able to perform bed mobility and transfers independently, with and without RW. Ambulated >432ft with RW and supervision. Did have at least 1 LOB with RW, able to correct without assistance. Next session she would benefit from re-assessment of the BERG. May benefit from transition to mobility technician caseload to maintain improvements and function.      Recommendations for follow up therapy are one component of a multi-disciplinary discharge planning process, led by the attending physician.  Recommendations may be updated based on patient status, additional functional criteria and insurance authorization.  Follow Up Recommendations  Follow physician's recommendations for discharge plan and follow up therapies     Assistance Recommended at Discharge Intermittent Supervision/Assistance  Equipment Recommendations  None recommended by PT    Recommendations for Other Services       Precautions / Restrictions Precautions Precautions: None     Mobility  Bed Mobility Overal bed mobility: Modified Independent Bed Mobility: Supine to Sit;Sit to Supine     Supine to sit: Modified independent (Device/Increase time)     General bed mobility comments: HOB elevated    Transfers Overall transfer level: Modified independent Equipment used: Rolling  walker (2 wheels);None Transfers: Sit to/from Stand Sit to Stand: Modified independent (Device/Increase time) Stand pivot transfers: Modified independent (Device/Increase time)              Ambulation/Gait Ambulation/Gait assistance: Supervision;Min guard   Assistive device: Rolling walker (2 wheels) Gait Pattern/deviations: WFL(Within Functional Limits)       General Gait Details: >101ft, small LOB noted with RW but able to correct without assist   Stairs             Wheelchair Mobility    Modified Rankin (Stroke Patients Only)       Balance Overall balance assessment: Independent Sitting-balance support: Feet supported;No upper extremity supported Sitting balance-Leahy Scale: Normal     Standing balance support: During functional activity Standing balance-Leahy Scale: Good                              Cognition Arousal/Alertness: Awake/alert Behavior During Therapy: WFL for tasks assessed/performed Overall Cognitive Status: Within Functional Limits for tasks assessed                                 General Comments: Pt is pleasant and cooperative        Exercises      General Comments General comments (skin integrity, edema, etc.): on room air throughout, HR 1120s, spO2 >90%      Pertinent Vitals/Pain Pain Assessment: No/denies pain    Home Living                          Prior Function  PT Goals (current goals can now be found in the care plan section) Progress towards PT goals: Progressing toward goals    Frequency    Min 2X/week      PT Plan Current plan remains appropriate    Co-evaluation              AM-PAC PT "6 Clicks" Mobility   Outcome Measure  Help needed turning from your back to your side while in a flat bed without using bedrails?: None Help needed moving from lying on your back to sitting on the side of a flat bed without using bedrails?: None Help needed  moving to and from a bed to a chair (including a wheelchair)?: None Help needed standing up from a chair using your arms (e.g., wheelchair or bedside chair)?: None Help needed to walk in hospital room?: None Help needed climbing 3-5 steps with a railing? : A Little 6 Click Score: 23    End of Session Equipment Utilized During Treatment: Gait belt Activity Tolerance: Patient tolerated treatment well;No increased pain Patient left: with call bell/phone within reach;in bed Nurse Communication: Mobility status PT Visit Diagnosis: Other abnormalities of gait and mobility (R26.89);Muscle weakness (generalized) (M62.81)     Time: 8372-9021 PT Time Calculation (min) (ACUTE ONLY): 17 min  Charges:  $Therapeutic Exercise: 8-22 mins                     Lieutenant Diego PT, DPT 2:29 PM,02/10/21

## 2021-02-11 DIAGNOSIS — Z930 Tracheostomy status: Secondary | ICD-10-CM | POA: Diagnosis not present

## 2021-02-11 DIAGNOSIS — D72819 Decreased white blood cell count, unspecified: Secondary | ICD-10-CM

## 2021-02-11 DIAGNOSIS — J3802 Paralysis of vocal cords and larynx, bilateral: Secondary | ICD-10-CM | POA: Diagnosis not present

## 2021-02-11 DIAGNOSIS — J9600 Acute respiratory failure, unspecified whether with hypoxia or hypercapnia: Secondary | ICD-10-CM | POA: Diagnosis not present

## 2021-02-11 LAB — GLUCOSE, CAPILLARY: Glucose-Capillary: 83 mg/dL (ref 70–99)

## 2021-02-11 NOTE — Progress Notes (Addendum)
Physical Therapy Treatment Patient Details Name: Stacy Moran MRN: [id number] DOB: 21-Jun-1955 Today's Date: 02/11/2021   History of Present Illness Stacy Moran is a 65 y/o female presented to the ED  from her SNF due to respiratory distress after her tracheostomy tube apparently became dislodged.  History of ruptured intracranial aneurysm and subsequent SHD/SAH, bilateral true vocal cord paralysis, chronic tracheostomy and PEG, HTN, epilepsy, hypothyroidism, and VP shunt for recurrent hydrocephalus, anxiety. Difficulty placement leading to prolonged hospital stay.    PT Comments    Pt alert, agreeable to treatment. Pt continues to amb in room without support with ability to don/doff shoes and dress without assistance. Progressed ambulation distance this session along with walking over various terrain surfaces. SpO2 > 96% with good heart rate < 100 bpm w/ RW, Supervision. No LOB noted with treatment this session. PT to perform BERG balance test during next session and then will be assigned to mobility specialist to continue with mobility depending of balance outcomes. Skilled PT intervention is indicated to address deficits in function, mobility, and to return to PLOF as able.     Recommendations for follow up therapy are one component of a multi-disciplinary discharge planning process, led by the attending physician.  Recommendations may be updated based on patient status, additional functional criteria and insurance authorization.  Follow Up Recommendations  Follow physician's recommendations for discharge plan and follow up therapies     Assistance Recommended at Discharge    Equipment Recommendations  None recommended by PT    Recommendations for Other Services       Precautions / Restrictions Precautions Precautions: None Restrictions Weight Bearing Restrictions: No     Mobility  Bed Mobility Overal bed mobility: Modified Independent Bed Mobility: Supine to Sit      Supine to sit: Modified independent (Device/Increase time)     General bed mobility comments: HOB elevated    Transfers Overall transfer level: Modified independent Equipment used: Rolling walker (2 wheels);None Transfers: Sit to/from Stand Sit to Stand: Modified independent (Device/Increase time)           General transfer comment: MOD I  for increased time    Ambulation/Gait Ambulation/Gait assistance: Supervision   Assistive device: Rolling walker (2 wheels) Gait Pattern/deviations: WFL(Within Functional Limits)       General Gait Details: >1039ft, cues for using RW appropriately   Stairs             Wheelchair Mobility    Modified Rankin (Stroke Patients Only)       Balance Overall balance assessment: Mild deficits observed, not formally tested                                          Cognition Arousal/Alertness: Awake/alert Behavior During Therapy: WFL for tasks assessed/performed Overall Cognitive Status: Within Functional Limits for tasks assessed                                          Exercises      General Comments General comments (skin integrity, edema, etc.): SpO2 96% throughout amb, HR 91      Pertinent Vitals/Pain Pain Assessment: No/denies pain    Home Living  Prior Function            PT Goals (current goals can now be found in the care plan section) Progress towards PT goals: Progressing toward goals    Frequency    Min 2X/week      PT Plan Current plan remains appropriate    Co-evaluation              AM-PAC PT "6 Clicks" Mobility   Outcome Measure  Help needed turning from your back to your side while in a flat bed without using bedrails?: None Help needed moving from lying on your back to sitting on the side of a flat bed without using bedrails?: None Help needed moving to and from a bed to a chair (including a wheelchair)?:  None Help needed standing up from a chair using your arms (e.g., wheelchair or bedside chair)?: None Help needed to walk in hospital room?: A Little Help needed climbing 3-5 steps with a railing? : A Little 6 Click Score: 22    End of Session Equipment Utilized During Treatment: Gait belt Activity Tolerance: Patient tolerated treatment well;No increased pain Patient left: with call bell/phone within reach;in bed Nurse Communication: Mobility status PT Visit Diagnosis: Other abnormalities of gait and mobility (R26.89);Muscle weakness (generalized) (M62.81)     Time: 1351-1430 PT Time Calculation (min) (ACUTE ONLY): 39 min  Charges:                        [employer], SPT

## 2021-02-11 NOTE — Progress Notes (Signed)
PROGRESS NOTE    Stacy Moran  QHU:[id number] DOB: October 20, 1955 DOA: 12/14/2020 PCP: [name], MD    Brief Narrative:  [phone] with a history of ruptured intracranial aneurysm and subsequent SHD/SAH, bilateral true vocal cord paralysis, chronic tracheostomy and PEG, HTN, epilepsy, hypothyroidism, and VP shunt for recurrent hydrocephalus who presented from her SNF due to respiratory distress after her tracheostomy tube apparently became dislodged.   On arrival, she was emergently taken to the OR, intubated, and her tracheostomy tube was replaced by ENT, Dr. Pryor Ochoa after dilation of the existing fistula.  She has subsequently stabilized.  Has been ready for discharge to skilled nursing facility for many days now,  however no SNF bed has been available.   Assessment & Plan:   Active Problems:   Tracheostomy status (HCC)   Acute respiratory failure (HCC)   PEG (percutaneous endoscopic gastrostomy) status ([place])   Essential hypertension   Bilateral vocal cord paralysis  Patient condition has been stable, no additional event.  Currently pending for nursing home placement.    DVT prophylaxis: Lovenox Code Status: full Family Communication:  Disposition Plan:    Status is: Inpatient  Remains inpatient appropriate because: Unsafe discharge.        I/O last 3 completed shifts: In: 355 [NG/GT:355] Out: -  No intake/output data recorded.     Consultants:  None  Procedures: None  Antimicrobials: None  Subjective: Doing well, airway secretion better controlled. Did not feel any short of breath, still on oxygen over trach collar. Still tolerating tube feeding without nausea vomiting. No fever chills  Objective: Vitals:   02/11/21 0000 02/11/21 0425 02/11/21 0726 02/11/21 1124  BP: 119/82  (!) 138/91 (!) 143/71  Pulse: 95  77 81  Resp: 18  16 18   Temp: 99.4 F (37.4 C)  98.7 F (37.1 C) 98.5 F (36.9 C)  TempSrc: Oral  Oral Oral  SpO2: 100%  100% 96%   Weight:  61.7 kg    Height:        Intake/Output Summary (Last 24 hours) at 02/11/2021 1216 Last data filed at 02/10/2021 2153 Gross per 24 hour  Intake 355 ml  Output --  Net 355 ml   Filed Weights   02/09/21 0500 02/10/21 0528 02/11/21 0425  Weight: 60.2 kg 61.7 kg 61.7 kg    Examination:  General exam: Appears calm and comfortable  Respiratory system: Clear to auscultation. Respiratory effort normal. Cardiovascular system: S1 & S2 heard, RRR. No JVD, murmurs, rubs, gallops or clicks. No pedal edema. Gastrointestinal system: Abdomen is nondistended, soft and nontender. No organomegaly or masses felt. Normal bowel sounds heard. Central nervous system: Alert and oriented. No focal neurological deficits. Extremities: Symmetric 5 x 5 power. Skin: No rashes, lesions or ulcers Psychiatry: Judgement and insight appear normal. Mood & affect appropriate.     Data Reviewed: I have personally reviewed following labs and imaging studies  CBC: No results for input(s): WBC, NEUTROABS, HGB, HCT, MCV, PLT in the last 168 hours. Basic Metabolic Panel: No results for input(s): NA, K, CL, CO2, GLUCOSE, BUN, CREATININE, CALCIUM, MG, PHOS in the last 168 hours. GFR: Estimated Creatinine Clearance: 67.4 mL/min (by C-G formula based on SCr of 0.51 mg/dL). Liver Function Tests: No results for input(s): AST, ALT, ALKPHOS, BILITOT, PROT, ALBUMIN in the last 168 hours. No results for input(s): LIPASE, AMYLASE in the last 168 hours. No results for input(s): AMMONIA in the last 168 hours. Coagulation Profile: No results for input(s): INR, PROTIME in  the last 168 hours. Cardiac Enzymes: No results for input(s): CKTOTAL, CKMB, CKMBINDEX, TROPONINI in the last 168 hours. BNP (last 3 results) No results for input(s): PROBNP in the last 8760 hours. HbA1C: No results for input(s): HGBA1C in the last 72 hours. CBG: Recent Labs  Lab 02/08/21 0520 02/09/21 0522 02/10/21 0621 02/10/21 0828  02/11/21 0554  GLUCAP 97 90 142* 119* 83   Lipid Profile: No results for input(s): CHOL, HDL, LDLCALC, TRIG, CHOLHDL, LDLDIRECT in the last 72 hours. Thyroid Function Tests: No results for input(s): TSH, T4TOTAL, FREET4, T3FREE, THYROIDAB in the last 72 hours. Anemia Panel: No results for input(s): VITAMINB12, FOLATE, FERRITIN, TIBC, IRON, RETICCTPCT in the last 72 hours. Sepsis Labs: No results for input(s): PROCALCITON, LATICACIDVEN in the last 168 hours.  No results found for this or any previous visit (from the past 240 hour(s)).       Radiology Studies: No results found.      Scheduled Meds:  amLODipine  5 mg Per Tube Daily   Chlorhexidine Gluconate Cloth  6 each Topical Daily   enoxaparin (LOVENOX) injection  40 mg Subcutaneous Q24H   feeding supplement (OSMOLITE 1.2 CAL)  355 mL Per Tube 5 X Daily   fluticasone  1 spray Each Nare BID   free water  150 mL Per Tube Q6H   glycopyrrolate  1 mg Per Tube Daily   guaiFENesin  10 mL Per Tube Q8H   levETIRAcetam  750 mg Per Tube BID   levothyroxine  100 mcg Per Tube Q0600   polyethylene glycol  17 g Per Tube BID   QUEtiapine  50 mg Per Tube BID   senna  1 tablet Per Tube Daily   Continuous Infusions:   LOS: 59 days    Time spent: 17 minutes    Sharen Hones, MD [facility]   To contact the attending provider between 7A-7P or the covering provider during after hours 7P-7A, please log into the web site [email or web address] and access using universal [place] password for that web site. If you do not have the password, please call the hospital operator.  02/11/2021, 12:16 PM

## 2021-02-12 DIAGNOSIS — J3802 Paralysis of vocal cords and larynx, bilateral: Secondary | ICD-10-CM | POA: Diagnosis not present

## 2021-02-12 DIAGNOSIS — J9600 Acute respiratory failure, unspecified whether with hypoxia or hypercapnia: Secondary | ICD-10-CM | POA: Diagnosis not present

## 2021-02-12 LAB — GLUCOSE, CAPILLARY
Glucose-Capillary: 102 mg/dL — ABNORMAL HIGH (ref 70–99)
Glucose-Capillary: 107 mg/dL — ABNORMAL HIGH (ref 70–99)

## 2021-02-12 MED ORDER — GLYCOPYRROLATE 1 MG PO TABS
1.0000 mg | ORAL_TABLET | Freq: Two times a day (BID) | ORAL | Status: DC
  Administered 2021-02-12 – 2021-02-18 (×13): 1 mg via ORAL
  Filled 2021-02-12 (×14): qty 1

## 2021-02-12 NOTE — Progress Notes (Addendum)
Physical Therapy Re-Evaluation Patient Details Name: Stacy Moran MRN: [id number] DOB: 09-07-1955 Today's Date: 02/12/2021   History of Present Illness Stacy Moran is a 65 y/o female presented to the ED  from her SNF due to respiratory distress after her tracheostomy tube apparently became dislodged.  History of ruptured intracranial aneurysm and subsequent SHD/SAH, bilateral true vocal cord paralysis, chronic tracheostomy and PEG, HTN, epilepsy, hypothyroidism, and VP shunt for recurrent hydrocephalus, anxiety. Difficulty placement leading to prolonged hospital stay.    PT Comments    Patient alert, in bed, very excited to see PT. Session focused on re-evaluation, goal update, and POC. The patient has demonstrated improvement in activity tolerance, endurance, balance, and gait. Recommending continued use of RW for longer distances for safety. Her BERG score improved to 54/56, as well as her 5 times sit to stand (11.92 improvement for 18 seconds originally). The PT and pt discussed her improvements and the plan to discharge her from acute PT, but will work with the mobility tech moving forward to maintain improvements and function. PT to sign off.    Recommendations for follow up therapy are one component of a multi-disciplinary discharge planning process, led by the attending physician.  Recommendations may be updated based on patient status, additional functional criteria and insurance authorization.  Follow Up Recommendations  No PT follow up     Assistance Recommended at Discharge Intermittent Supervision/Assistance  Equipment Recommendations  None recommended by PT    Recommendations for Other Services       Precautions / Restrictions Precautions Precautions: None Restrictions Weight Bearing Restrictions: No     Mobility  Bed Mobility Overal bed mobility: Modified Independent                  Transfers Overall transfer level: Independent                       Ambulation/Gait Ambulation/Gait assistance: Independent Gait Distance (Feet): 60 Feet           General Gait Details: gait in room, further deferred at this time due tobalance assessment   Stairs             Wheelchair Mobility    Modified Rankin (Stroke Patients Only)       Balance     Sitting balance-Leahy Scale: Normal     Standing balance support: No upper extremity supported Standing balance-Leahy Scale: Good                   Standardized Balance Assessment Standardized Balance Assessment : Berg Balance Test Berg Balance Test Sit to Stand: Able to stand without using hands and stabilize independently Standing Unsupported: Able to stand safely 2 minutes Sitting with Back Unsupported but Feet Supported on Floor or Stool: Able to sit safely and securely 2 minutes Stand to Sit: Sits safely with minimal use of hands Transfers: Able to transfer safely, minor use of hands Standing Unsupported with Eyes Closed: Able to stand 10 seconds safely Standing Ubsupported with Feet Together: Able to place feet together independently and stand 1 minute safely From Standing, Reach Forward with Outstretched Arm: Can reach confidently >25 cm (10") From Standing Position, Pick up Object from Floor: Able to pick up shoe safely and easily From Standing Position, Turn to Look Behind Over each Shoulder: Looks behind from both sides and weight shifts well Turn 360 Degrees: Able to turn 360 degrees safely in 4 seconds or less Standing Unsupported, Alternately Place Feet  on Step/Stool: Able to stand independently and safely and complete 8 steps in 20 seconds Standing Unsupported, One Foot in Front: Able to plae foot ahead of the other independently and hold 30 seconds Standing on One Leg: Able to lift leg independently and hold 5-10 seconds Total Score: 54        Cognition Arousal/Alertness: Awake/alert Behavior During Therapy: WFL for tasks  assessed/performed Overall Cognitive Status: Within Functional Limits for tasks assessed                                          Exercises Other Exercises Other Exercises: 5 times sit to stand 11.92 seconds without UE support    General Comments        Pertinent Vitals/Pain Pain Assessment: No/denies pain    Home Living                          Prior Function            PT Goals (current goals can now be found in the care plan section)      Frequency     (PT to sign off)      PT Plan Frequency needs to be updated    Co-evaluation              AM-PAC PT "6 Clicks" Mobility   Outcome Measure  Help needed turning from your back to your side while in a flat bed without using bedrails?: None Help needed moving from lying on your back to sitting on the side of a flat bed without using bedrails?: None Help needed moving to and from a bed to a chair (including a wheelchair)?: None Help needed standing up from a chair using your arms (e.g., wheelchair or bedside chair)?: None Help needed to walk in hospital room?: None Help needed climbing 3-5 steps with a railing? : None 6 Click Score: 24    End of Session   Activity Tolerance: Patient tolerated treatment well;No increased pain Patient left: with call bell/phone within reach;in bed Nurse Communication: Mobility status PT Visit Diagnosis: Other abnormalities of gait and mobility (R26.89);Muscle weakness (generalized) (M62.81)     Time: 9983-3825 PT Time Calculation (min) (ACUTE ONLY): 18 min  Charges:  $Neuromuscular Re-education: 8-22 mins                     Lieutenant Diego PT, DPT 10:20 AM,02/12/21

## 2021-02-12 NOTE — Progress Notes (Signed)
PROGRESS NOTE    Stacy Moran  KDT:[id number] DOB: 30-Mar-1955 DOA: 12/14/2020 PCP: [name], MD    Brief Narrative:  [phone] with a history of ruptured intracranial aneurysm and subsequent SHD/SAH, bilateral true vocal cord paralysis, chronic tracheostomy and PEG, HTN, epilepsy, hypothyroidism, and VP shunt for recurrent hydrocephalus who presented from her SNF due to respiratory distress after her tracheostomy tube apparently became dislodged.   On arrival, she was emergently taken to the OR, intubated, and her tracheostomy tube was replaced by ENT, Dr. Pryor Ochoa after dilation of the existing fistula.  She has subsequently stabilized.  Has been ready for discharge to skilled nursing facility for many days now,  however no SNF bed has been available.   Assessment & Plan:   Active Problems:   Tracheostomy status (HCC)   Acute respiratory failure (HCC)   PEG (percutaneous endoscopic gastrostomy) status ([place])   S/P percutaneous endoscopic gastrostomy (PEG) tube placement ([place])   Essential hypertension   Thrombocytopenia ([place])   History of hemorrhagic cerebrovascular accident (CVA) with residual deficit   Bilateral vocal cord paralysis   Leukopenia  Patient had increased airway secretion, currently taking 1 mg of ropinirole daily, she does not have any more postnasal draining.  I will increase to twice a day.    DVT prophylaxis: Lovenox Code Status: full Family Communication:  Disposition Plan:      Status is: Inpatient   Remains inpatient appropriate because: Unsafe discharge       I/O last 3 completed shifts: In: 710 [Other:355; NG/GT:355] Out: -  No intake/output data recorded.   Subjective: Patient has increased airway secretion otherwise doing well.  No short of breath.  Still on 5 L oxygen over trach collar Abdominal pain nausea vomiting.  Having regular bowel movements. No fever chills.  Objective: Vitals:   02/12/21 0411 02/12/21 0515 02/12/21 0756  02/12/21 1139  BP: 123/79  134/83 121/77  Pulse: 67  84 80  Resp: (!) 22  20 20   Temp: 97.6 F (36.4 C)  97.8 F (36.6 C) 98 F (36.7 C)  TempSrc: Oral  Oral   SpO2: 100%  100% 99%  Weight:  60.3 kg    Height:        Intake/Output Summary (Last 24 hours) at 02/12/2021 1233 Last data filed at 02/12/2021 0531 Gross per 24 hour  Intake 355 ml  Output --  Net 355 ml   Filed Weights   02/10/21 0528 02/11/21 0425 02/12/21 0515  Weight: 61.7 kg 61.7 kg 60.3 kg    Examination:  General exam: Appears calm and comfortable  Respiratory system: Clear to auscultation. Respiratory effort normal. Cardiovascular system: S1 & S2 heard, RRR. No JVD, murmurs, rubs, gallops or clicks. No pedal edema. Gastrointestinal system: Abdomen is nondistended, soft and nontender. No organomegaly or masses felt. Normal bowel sounds heard. Central nervous system: Alert and oriented. No focal neurological deficits. Extremities: Symmetric 5 x 5 power. Skin: No rashes, lesions or ulcers Psychiatry: Judgement and insight appear normal. Mood & affect appropriate.     Data Reviewed: I have personally reviewed following labs and imaging studies  CBC: No results for input(s): WBC, NEUTROABS, HGB, HCT, MCV, PLT in the last 168 hours. Basic Metabolic Panel: No results for input(s): NA, K, CL, CO2, GLUCOSE, BUN, CREATININE, CALCIUM, MG, PHOS in the last 168 hours. GFR: Estimated Creatinine Clearance: 67.4 mL/min (by C-G formula based on SCr of 0.51 mg/dL). Liver Function Tests: No results for input(s): AST, ALT, ALKPHOS, BILITOT, PROT,  ALBUMIN in the last 168 hours. No results for input(s): LIPASE, AMYLASE in the last 168 hours. No results for input(s): AMMONIA in the last 168 hours. Coagulation Profile: No results for input(s): INR, PROTIME in the last 168 hours. Cardiac Enzymes: No results for input(s): CKTOTAL, CKMB, CKMBINDEX, TROPONINI in the last 168 hours. BNP (last 3 results) No results for  input(s): PROBNP in the last 8760 hours. HbA1C: No results for input(s): HGBA1C in the last 72 hours. CBG: Recent Labs  Lab 02/10/21 0621 02/10/21 0828 02/11/21 0554 02/12/21 0539 02/12/21 0834  GLUCAP 142* 119* 83 102* 107*   Lipid Profile: No results for input(s): CHOL, HDL, LDLCALC, TRIG, CHOLHDL, LDLDIRECT in the last 72 hours. Thyroid Function Tests: No results for input(s): TSH, T4TOTAL, FREET4, T3FREE, THYROIDAB in the last 72 hours. Anemia Panel: No results for input(s): VITAMINB12, FOLATE, FERRITIN, TIBC, IRON, RETICCTPCT in the last 72 hours. Sepsis Labs: No results for input(s): PROCALCITON, LATICACIDVEN in the last 168 hours.  No results found for this or any previous visit (from the past 240 hour(s)).       Radiology Studies: No results found.      Scheduled Meds:  amLODipine  5 mg Per Tube Daily   Chlorhexidine Gluconate Cloth  6 each Topical Daily   enoxaparin (LOVENOX) injection  40 mg Subcutaneous Q24H   feeding supplement (OSMOLITE 1.2 CAL)  355 mL Per Tube 5 X Daily   fluticasone  1 spray Each Nare BID   free water  150 mL Per Tube Q6H   glycopyrrolate  1 mg Oral BID   guaiFENesin  10 mL Per Tube Q8H   levETIRAcetam  750 mg Per Tube BID   levothyroxine  100 mcg Per Tube Q0600   polyethylene glycol  17 g Per Tube BID   QUEtiapine  50 mg Per Tube BID   senna  1 tablet Per Tube Daily   Continuous Infusions:   LOS: 60 days    Time spent: 22 minutes    Sharen Hones, MD [facility]   To contact the attending provider between 7A-7P or the covering provider during after hours 7P-7A, please log into the web site [email or web address] and access using universal [place] password for that web site. If you do not have the password, please call the hospital operator.  02/12/2021, 12:33 PM

## 2021-02-13 DIAGNOSIS — J3802 Paralysis of vocal cords and larynx, bilateral: Secondary | ICD-10-CM | POA: Diagnosis not present

## 2021-02-13 DIAGNOSIS — J9600 Acute respiratory failure, unspecified whether with hypoxia or hypercapnia: Secondary | ICD-10-CM | POA: Diagnosis not present

## 2021-02-13 LAB — CBC
HCT: 33.6 % — ABNORMAL LOW (ref 36.0–46.0)
Hemoglobin: 10.3 g/dL — ABNORMAL LOW (ref 12.0–15.0)
MCH: 25.4 pg — ABNORMAL LOW (ref 26.0–34.0)
MCHC: 30.7 g/dL (ref 30.0–36.0)
MCV: 82.8 fL (ref 80.0–100.0)
Platelets: 125 10*3/uL — ABNORMAL LOW (ref 150–400)
RBC: 4.06 MIL/uL (ref 3.87–5.11)
RDW: 15 % (ref 11.5–15.5)
WBC: 3.7 10*3/uL — ABNORMAL LOW (ref 4.0–10.5)
nRBC: 0 % (ref 0.0–0.2)

## 2021-02-13 LAB — GLUCOSE, CAPILLARY
Glucose-Capillary: 120 mg/dL — ABNORMAL HIGH (ref 70–99)
Glucose-Capillary: 86 mg/dL (ref 70–99)

## 2021-02-13 NOTE — Progress Notes (Signed)
Mobility Specialist - Progress Note   02/13/21 1500  Mobility  Activity Ambulated in hall  Level of Assistance Standby assist, set-up cues, supervision of patient - no hands on  Assistive Device Front wheel walker  Distance Ambulated (ft) 400 ft  Mobility Ambulated with assistance in hallway  Mobility Response Tolerated well  Mobility performed by Mobility specialist  $Mobility charge 1 Mobility    Pt sleeping on arrival, awakened by voice. Performed UB/LB dressing and donning/doffing socks and shoes prior to activity. Ambulated in hallway on RA with sats maintaining > 90%. Fatigued after activity. Pt left in chair with needs in reach, back on 5L trach.    Kathee Delton Mobility Specialist 02/13/21, 3:36 PM

## 2021-02-13 NOTE — Progress Notes (Signed)
PROGRESS NOTE    Stacy Moran  HWT:[id number] DOB: 1955-08-06 DOA: 12/14/2020 PCP: [name], MD    Brief Narrative:  [phone] with a history of ruptured intracranial aneurysm and subsequent SHD/SAH, bilateral true vocal cord paralysis, chronic tracheostomy and PEG, HTN, epilepsy, hypothyroidism, and VP shunt for recurrent hydrocephalus who presented from her SNF due to respiratory distress after her tracheostomy tube apparently became dislodged.   On arrival, she was emergently taken to the OR, intubated, and her tracheostomy tube was replaced by ENT, Dr. Pryor Ochoa after dilation of the existing fistula.  She has subsequently stabilized.  Has been ready for discharge to skilled nursing facility for many days now,  however no SNF bed has been available.   Assessment & Plan:   Active Problems:   Tracheostomy status (HCC)   Acute respiratory failure (HCC)   PEG (percutaneous endoscopic gastrostomy) status ([place])   S/P percutaneous endoscopic gastrostomy (PEG) tube placement ([place])   Essential hypertension   Thrombocytopenia ([place])   History of hemorrhagic cerebrovascular accident (CVA) with residual deficit   Bilateral vocal cord paralysis   Leukopenia   Patient condition is stable, airway secretions better after increased dose of [place]. Good oxygen saturation on 5 L.    DVT prophylaxis: Lovenox Code Status: full Family Communication:  Disposition Plan:      Status is: Inpatient   Remains inpatient appropriate because: Unsafe discharge       I/O last 3 completed shifts: In: 355 [Other:355] Out: -  No intake/output data recorded.    Subjective: Patient doing better, airway secretion is improving. Denies any short of breath, on 5 L over trach collar. Good appetite no nausea vomiting, normal bowel movement.  Objective: Vitals:   02/13/21 0525 02/13/21 0600 02/13/21 0847 02/13/21 1136  BP: (!) 143/84  132/78 127/83  Pulse: 78  82 87  Resp: 14  18 18   Temp:  98.6 F (37 C)  98 F (36.7 C) 99.7 F (37.6 C)  TempSrc: Oral   Oral  SpO2: 100%  100% 100%  Weight:  60.9 kg    Height:       No intake or output data in the 24 hours ending 02/13/21 1243 Filed Weights   02/11/21 0425 02/12/21 0515 02/13/21 0600  Weight: 61.7 kg 60.3 kg 60.9 kg    Examination:  General exam: Appears calm and comfortable  Respiratory system: Clear to auscultation. Respiratory effort normal. Cardiovascular system: S1 & S2 heard, RRR. No JVD, murmurs, rubs, gallops or clicks. No pedal edema. Gastrointestinal system: Abdomen is nondistended, soft and nontender. No organomegaly or masses felt. Normal bowel sounds heard. Central nervous system: Alert and oriented. No focal neurological deficits. Extremities: Symmetric 5 x 5 power. Skin: No rashes, lesions or ulcers Psychiatry: Judgement and insight appear normal. Mood & affect appropriate.     Data Reviewed: I have personally reviewed following labs and imaging studies  CBC: Recent Labs  Lab 02/13/21 0513  WBC 3.7*  HGB 10.3*  HCT 33.6*  MCV 82.8  PLT 150*   Basic Metabolic Panel: No results for input(s): NA, K, CL, CO2, GLUCOSE, BUN, CREATININE, CALCIUM, MG, PHOS in the last 168 hours. GFR: Estimated Creatinine Clearance: 67.4 mL/min (by C-G formula based on SCr of 0.51 mg/dL). Liver Function Tests: No results for input(s): AST, ALT, ALKPHOS, BILITOT, PROT, ALBUMIN in the last 168 hours. No results for input(s): LIPASE, AMYLASE in the last 168 hours. No results for input(s): AMMONIA in the last 168 hours. Coagulation Profile:  No results for input(s): INR, PROTIME in the last 168 hours. Cardiac Enzymes: No results for input(s): CKTOTAL, CKMB, CKMBINDEX, TROPONINI in the last 168 hours. BNP (last 3 results) No results for input(s): PROBNP in the last 8760 hours. HbA1C: No results for input(s): HGBA1C in the last 72 hours. CBG: Recent Labs  Lab 02/11/21 0554 02/12/21 0539 02/12/21 0834  02/13/21 0527 02/13/21 0849  GLUCAP 83 102* 107* 120* 86   Lipid Profile: No results for input(s): CHOL, HDL, LDLCALC, TRIG, CHOLHDL, LDLDIRECT in the last 72 hours. Thyroid Function Tests: No results for input(s): TSH, T4TOTAL, FREET4, T3FREE, THYROIDAB in the last 72 hours. Anemia Panel: No results for input(s): VITAMINB12, FOLATE, FERRITIN, TIBC, IRON, RETICCTPCT in the last 72 hours. Sepsis Labs: No results for input(s): PROCALCITON, LATICACIDVEN in the last 168 hours.  No results found for this or any previous visit (from the past 240 hour(s)).       Radiology Studies: No results found.      Scheduled Meds:  amLODipine  5 mg Per Tube Daily   Chlorhexidine Gluconate Cloth  6 each Topical Daily   enoxaparin (LOVENOX) injection  40 mg Subcutaneous Q24H   feeding supplement (OSMOLITE 1.2 CAL)  355 mL Per Tube 5 X Daily   fluticasone  1 spray Each Nare BID   free water  150 mL Per Tube Q6H   glycopyrrolate  1 mg Oral BID   guaiFENesin  10 mL Per Tube Q8H   levETIRAcetam  750 mg Per Tube BID   levothyroxine  100 mcg Per Tube Q0600   polyethylene glycol  17 g Per Tube BID   QUEtiapine  50 mg Per Tube BID   senna  1 tablet Per Tube Daily   Continuous Infusions:   LOS: 61 days    Time spent: 22 minutes    Sharen Hones, MD [facility]   To contact the attending provider between 7A-7P or the covering provider during after hours 7P-7A, please log into the web site [email or web address] and access using universal [place] password for that web site. If you do not have the password, please call the hospital operator.  02/13/2021, 12:43 PM

## 2021-02-13 NOTE — Progress Notes (Signed)
Pt.'s trach changed this am to a 4.0 shiley flex cuffless. Pt. Tolerated well and procedure uneventful.

## 2021-02-14 DIAGNOSIS — J9600 Acute respiratory failure, unspecified whether with hypoxia or hypercapnia: Secondary | ICD-10-CM | POA: Diagnosis not present

## 2021-02-14 DIAGNOSIS — J3802 Paralysis of vocal cords and larynx, bilateral: Secondary | ICD-10-CM | POA: Diagnosis not present

## 2021-02-14 LAB — GLUCOSE, CAPILLARY: Glucose-Capillary: 147 mg/dL — ABNORMAL HIGH (ref 70–99)

## 2021-02-14 NOTE — Progress Notes (Signed)
PROGRESS NOTE    Stacy Moran  ONG:[id number] DOB: [birth year] DOA: 12/14/2020 PCP: [name], MD    Brief Narrative:   [phone] with a history of ruptured intracranial aneurysm and subsequent SHD/SAH, bilateral true vocal cord paralysis, chronic tracheostomy and PEG, HTN, epilepsy, hypothyroidism, and VP shunt for recurrent hydrocephalus who presented from her SNF due to respiratory distress after her tracheostomy tube apparently became dislodged.   On arrival, she was emergently taken to the OR, intubated, and her tracheostomy tube was replaced by ENT, Dr. Pryor Ochoa after dilation of the existing fistula.  She has subsequently stabilized.  Has been ready for discharge to skilled nursing facility for many days now,  however no SNF bed has been available.  Assessment & Plan:   Active Problems:   Tracheostomy status (HCC)   Acute respiratory failure (HCC)   PEG (percutaneous endoscopic gastrostomy) status ([place])   S/P percutaneous endoscopic gastrostomy (PEG) tube placement ([place])   Essential hypertension   Thrombocytopenia ([place])   History of hemorrhagic cerebrovascular accident (CVA) with residual deficit   Bilateral vocal cord paralysis   Leukopenia  No changes in patient condition today.  Continue current treatment.     DVT prophylaxis: Lovenox Code Status: full Family Communication:  Disposition Plan:      Status is: Inpatient   Remains inpatient appropriate because: Unsafe discharge        No intake/output data recorded. No intake/output data recorded.     Subjective: Condition improved.  Good appetite without nausea vomiting.  Airway secretion is better managed.  No short of breath.  Still on 5 L oxygen.  Objective: Vitals:   02/14/21 0527 02/14/21 0615 02/14/21 0803 02/14/21 1148  BP: (!) 99/55 105/71 109/75 (!) 162/89  Pulse: 68 70 63 87  Resp: 16 16 16 18   Temp:  97.9 F (36.6 C) 98 F (36.7 C) 97.6 F (36.4 C)  TempSrc:      SpO2: 100%  100%  100%  Weight:      Height:       No intake or output data in the 24 hours ending 02/14/21 1306 Filed Weights   02/12/21 0515 02/13/21 0600 02/14/21 0500  Weight: 60.3 kg 60.9 kg 60.3 kg    Examination:  General exam: Appears calm and comfortable  Respiratory system: Clear to auscultation. Respiratory effort normal. Cardiovascular system: S1 & S2 heard, RRR. No JVD, murmurs, rubs, gallops or clicks. No pedal edema. Gastrointestinal system: Abdomen is nondistended, soft and nontender. No organomegaly or masses felt. Normal bowel sounds heard. Central nervous system: Alert and oriented. No focal neurological deficits. Extremities: Symmetric 5 x 5 power. Skin: No rashes, lesions or ulcers Psychiatry: Judgement and insight appear normal. Mood & affect appropriate.     Data Reviewed: I have personally reviewed following labs and imaging studies  CBC: Recent Labs  Lab 02/13/21 0513  WBC 3.7*  HGB 10.3*  HCT 33.6*  MCV 82.8  PLT 027*   Basic Metabolic Panel: No results for input(s): NA, K, CL, CO2, GLUCOSE, BUN, CREATININE, CALCIUM, MG, PHOS in the last 168 hours. GFR: Estimated Creatinine Clearance: 67.4 mL/min (by C-G formula based on SCr of 0.51 mg/dL). Liver Function Tests: No results for input(s): AST, ALT, ALKPHOS, BILITOT, PROT, ALBUMIN in the last 168 hours. No results for input(s): LIPASE, AMYLASE in the last 168 hours. No results for input(s): AMMONIA in the last 168 hours. Coagulation Profile: No results for input(s): INR, PROTIME in the last 168 hours. Cardiac Enzymes: No  results for input(s): CKTOTAL, CKMB, CKMBINDEX, TROPONINI in the last 168 hours. BNP (last 3 results) No results for input(s): PROBNP in the last 8760 hours. HbA1C: No results for input(s): HGBA1C in the last 72 hours. CBG: Recent Labs  Lab 02/12/21 0539 02/12/21 0834 02/13/21 0527 02/13/21 0849 02/14/21 0529  GLUCAP 102* 107* 120* 86 147*   Lipid Profile: No results for input(s):  CHOL, HDL, LDLCALC, TRIG, CHOLHDL, LDLDIRECT in the last 72 hours. Thyroid Function Tests: No results for input(s): TSH, T4TOTAL, FREET4, T3FREE, THYROIDAB in the last 72 hours. Anemia Panel: No results for input(s): VITAMINB12, FOLATE, FERRITIN, TIBC, IRON, RETICCTPCT in the last 72 hours. Sepsis Labs: No results for input(s): PROCALCITON, LATICACIDVEN in the last 168 hours.  No results found for this or any previous visit (from the past 240 hour(s)).       Radiology Studies: No results found.      Scheduled Meds:  amLODipine  5 mg Per Tube Daily   Chlorhexidine Gluconate Cloth  6 each Topical Daily   enoxaparin (LOVENOX) injection  40 mg Subcutaneous Q24H   feeding supplement (OSMOLITE 1.2 CAL)  355 mL Per Tube 5 X Daily   fluticasone  1 spray Each Nare BID   free water  150 mL Per Tube Q6H   glycopyrrolate  1 mg Oral BID   guaiFENesin  10 mL Per Tube Q8H   levETIRAcetam  750 mg Per Tube BID   levothyroxine  100 mcg Per Tube Q0600   polyethylene glycol  17 g Per Tube BID   QUEtiapine  50 mg Per Tube BID   senna  1 tablet Per Tube Daily   Continuous Infusions:   LOS: 62 days    Time spent: 16 minutes    Sharen Hones, MD [facility]   To contact the attending provider between 7A-7P or the covering provider during after hours 7P-7A, please log into the web site [email or web address] and access using universal [place] password for that web site. If you do not have the password, please call the hospital operator.  02/14/2021, 1:06 PM

## 2021-02-14 NOTE — TOC Progression Note (Signed)
Transition of Care [facility]) - Progression Note    Patient Details  Name: Stacy Moran MRN: [id number] Date of Birth: [birth year]  Transition of Care [facility]) CM/SW Contact  Kerin Salen, RN Phone Number: 02/14/2021, 8:06 AM  Clinical Narrative:  Called and spoke with Jackelyn Poling and she is at home ill no access to systems needed will address discharge on Monday, 02/16/21.     Expected Discharge Plan: [place] Barriers to Discharge: No SNF bed  Expected Discharge Plan and Services Expected Discharge Plan: [place]   Discharge Planning Services: CM Consult Post Acute Care Choice: [place] Living arrangements for the past 2 months: [place]                 DME Arranged: N/A DME Agency: NA       HH Arranged: NA HH Agency: NA         Social Determinants of Health (SDOH) Interventions    Readmission Risk Interventions Readmission Risk Prevention Plan 12/31/2020  Transportation Screening Complete  Medication Review Press photographer) Complete  PCP or Specialist appointment within 3-5 days of discharge Complete  HRI or Home Care Consult Complete  SW Recovery Care/Counseling Consult Complete  Palliative Care Screening Not [place] Complete

## 2021-02-15 DIAGNOSIS — J3802 Paralysis of vocal cords and larynx, bilateral: Secondary | ICD-10-CM | POA: Diagnosis not present

## 2021-02-15 DIAGNOSIS — J9600 Acute respiratory failure, unspecified whether with hypoxia or hypercapnia: Secondary | ICD-10-CM | POA: Diagnosis not present

## 2021-02-15 LAB — GLUCOSE, CAPILLARY: Glucose-Capillary: 87 mg/dL (ref 70–99)

## 2021-02-15 NOTE — Progress Notes (Signed)
PROGRESS NOTE    Stacy Moran  ESP:[id number] DOB: 1956/02/07 DOA: 12/14/2020 PCP: [name], MD    Brief Narrative:  [phone] with a history of ruptured intracranial aneurysm and subsequent SHD/SAH, bilateral true vocal cord paralysis, chronic tracheostomy and PEG, HTN, epilepsy, hypothyroidism, and VP shunt for recurrent hydrocephalus who presented from her SNF due to respiratory distress after her tracheostomy tube apparently became dislodged.   On arrival, she was emergently taken to the OR, intubated, and her tracheostomy tube was replaced by ENT, Dr. Pryor Ochoa after dilation of the existing fistula.  She has subsequently stabilized.  Has been ready for discharge to skilled nursing facility for many days now,  however no SNF bed has been available.   Assessment & Plan:   Active Problems:   Tracheostomy status (HCC)   Acute respiratory failure (HCC)   PEG (percutaneous endoscopic gastrostomy) status ([place])   S/P percutaneous endoscopic gastrostomy (PEG) tube placement ([place])   Essential hypertension   Thrombocytopenia ([place])   History of hemorrhagic cerebrovascular accident (CVA) with residual deficit   Bilateral vocal cord paralysis   Leukopenia  Airway secretion is better controlled.  No new issue.   DVT prophylaxis: Lovenox Code Status: full Family Communication:  Disposition Plan:      Status is: Inpatient   Remains inpatient appropriate because: Unsafe discharge      No intake/output data recorded. No intake/output data recorded.    Subjective: Doing well, airway secretions better.  No short of breath.  Still on 5 L oxygen over trach collar. No abdominal pain nausea vomiting.  Objective: Vitals:   02/15/21 0410 02/15/21 0413 02/15/21 0822 02/15/21 1204  BP: 103/72  107/79 106/74  Pulse: 73  74 72  Resp: 16  16 17   Temp: 98.9 F (37.2 C)  (!) 97.4 F (36.3 C) 97.7 F (36.5 C)  TempSrc:   Oral Oral  SpO2: 98%  100% 100%  Weight:  62 kg    Height:        No intake or output data in the 24 hours ending 02/15/21 1238 Filed Weights   02/13/21 0600 02/14/21 0500 02/15/21 0413  Weight: 60.9 kg 60.3 kg 62 kg    Examination:  General exam: Appears calm and comfortable  Respiratory system: Clear to auscultation. Respiratory effort normal. Cardiovascular system: S1 & S2 heard, RRR. No JVD, murmurs, rubs, gallops or clicks. No pedal edema. Gastrointestinal system: Abdomen is nondistended, soft and nontender. No organomegaly or masses felt. Normal bowel sounds heard. Central nervous system: Alert and oriented. No focal neurological deficits. Extremities: Symmetric 5 x 5 power. Skin: No rashes, lesions or ulcers Psychiatry: Mood & affect appropriate.     Data Reviewed: I have personally reviewed following labs and imaging studies  CBC: Recent Labs  Lab 02/13/21 0513  WBC 3.7*  HGB 10.3*  HCT 33.6*  MCV 82.8  PLT 545*   Basic Metabolic Panel: No results for input(s): NA, K, CL, CO2, GLUCOSE, BUN, CREATININE, CALCIUM, MG, PHOS in the last 168 hours. GFR: Estimated Creatinine Clearance: 67.4 mL/min (by C-G formula based on SCr of 0.51 mg/dL). Liver Function Tests: No results for input(s): AST, ALT, ALKPHOS, BILITOT, PROT, ALBUMIN in the last 168 hours. No results for input(s): LIPASE, AMYLASE in the last 168 hours. No results for input(s): AMMONIA in the last 168 hours. Coagulation Profile: No results for input(s): INR, PROTIME in the last 168 hours. Cardiac Enzymes: No results for input(s): CKTOTAL, CKMB, CKMBINDEX, TROPONINI in the last 168 hours.  BNP (last 3 results) No results for input(s): PROBNP in the last 8760 hours. HbA1C: No results for input(s): HGBA1C in the last 72 hours. CBG: Recent Labs  Lab 02/12/21 0834 02/13/21 0527 02/13/21 0849 02/14/21 0529 02/15/21 0412  GLUCAP 107* 120* 86 147* 87   Lipid Profile: No results for input(s): CHOL, HDL, LDLCALC, TRIG, CHOLHDL, LDLDIRECT in the last 72  hours. Thyroid Function Tests: No results for input(s): TSH, T4TOTAL, FREET4, T3FREE, THYROIDAB in the last 72 hours. Anemia Panel: No results for input(s): VITAMINB12, FOLATE, FERRITIN, TIBC, IRON, RETICCTPCT in the last 72 hours. Sepsis Labs: No results for input(s): PROCALCITON, LATICACIDVEN in the last 168 hours.  No results found for this or any previous visit (from the past 240 hour(s)).       Radiology Studies: No results found.      Scheduled Meds:  amLODipine  5 mg Per Tube Daily   Chlorhexidine Gluconate Cloth  6 each Topical Daily   enoxaparin (LOVENOX) injection  40 mg Subcutaneous Q24H   feeding supplement (OSMOLITE 1.2 CAL)  355 mL Per Tube 5 X Daily   fluticasone  1 spray Each Nare BID   free water  150 mL Per Tube Q6H   glycopyrrolate  1 mg Oral BID   guaiFENesin  10 mL Per Tube Q8H   levETIRAcetam  750 mg Per Tube BID   levothyroxine  100 mcg Per Tube Q0600   polyethylene glycol  17 g Per Tube BID   QUEtiapine  50 mg Per Tube BID   senna  1 tablet Per Tube Daily   Continuous Infusions:   LOS: 63 days    Time spent: 16 minutes    Sharen Hones, MD [facility]   To contact the attending provider between 7A-7P or the covering provider during after hours 7P-7A, please log into the web site [email or web address] and access using universal [place] password for that web site. If you do not have the password, please call the hospital operator.  02/15/2021, 12:38 PM

## 2021-02-16 LAB — GLUCOSE, CAPILLARY: Glucose-Capillary: 95 mg/dL (ref 70–99)

## 2021-02-16 NOTE — Progress Notes (Signed)
PROGRESS NOTE    Stacy Moran  FIE:[id number] DOB: [birth year] DOA: 12/14/2020 PCP: [name], MD    Brief Narrative:  [phone] with a history of ruptured intracranial aneurysm and subsequent SHD/SAH, bilateral true vocal cord paralysis, chronic tracheostomy and PEG, HTN, epilepsy, hypothyroidism, and VP shunt for recurrent hydrocephalus who presented from her SNF due to respiratory distress after her tracheostomy tube apparently became dislodged.   On arrival, she was emergently taken to the OR, intubated, and her tracheostomy tube was replaced by ENT, Dr. Pryor Ochoa after dilation of the existing fistula.  She has subsequently stabilized.  Has been ready for discharge to skilled nursing facility for many days now,  however no SNF bed has been available.   Assessment & Plan:   Active Problems:   Tracheostomy status (HCC)   Acute respiratory failure (HCC)   PEG (percutaneous endoscopic gastrostomy) status ([place])   S/P percutaneous endoscopic gastrostomy (PEG) tube placement ([place])   Essential hypertension   Thrombocytopenia ([place])   History of hemorrhagic cerebrovascular accident (CVA) with residual deficit   Bilateral vocal cord paralysis   Leukopenia  Patient doing well, continue current treatment.  Pending nursing home placement    DVT prophylaxis: Lovenox Code Status: full Family Communication:  Disposition Plan:      Status is: Inpatient   Remains inpatient appropriate because: Unsafe discharge         I/O last 3 completed shifts: In: 370 [Other:370] Out: -  No intake/output data recorded.        Subjective: Patient doing well, airway secretion is controlled.  On 5 L oxygen over trach collar, no short of breath. Tolerating tube feeding without nausea vomiting. No diarrhea.  Objective: Vitals:   02/16/21 1149 02/16/21 1150 02/16/21 1154 02/16/21 1226  BP:   133/84   Pulse:  75  89  Resp:  17  18  Temp: 97.7 F (36.5 C)     TempSrc: Oral     SpO2:   99%  99%  Weight:      Height:        Intake/Output Summary (Last 24 hours) at 02/16/2021 1243 Last data filed at 02/15/2021 2015 Gross per 24 hour  Intake 370 ml  Output --  Net 370 ml   Filed Weights   02/14/21 0500 02/15/21 0413 02/16/21 0500  Weight: 60.3 kg 62 kg 61.3 kg    Examination:  General exam: Appears calm and comfortable  Respiratory system: Clear to auscultation. Respiratory effort normal. Cardiovascular system: S1 & S2 heard, RRR. No JVD, murmurs, rubs, gallops or clicks. No pedal edema. Gastrointestinal system: Abdomen is nondistended, soft and nontender. No organomegaly or masses felt. Normal bowel sounds heard. Central nervous system: Alert and oriented. No focal neurological deficits. Extremities: Symmetric 5 x 5 power. Skin: No rashes, lesions or ulcers Psychiatry: Judgement and insight appear normal. Mood & affect appropriate.     Data Reviewed: I have personally reviewed following labs and imaging studies  CBC: Recent Labs  Lab 02/13/21 0513  WBC 3.7*  HGB 10.3*  HCT 33.6*  MCV 82.8  PLT 630*   Basic Metabolic Panel: No results for input(s): NA, K, CL, CO2, GLUCOSE, BUN, CREATININE, CALCIUM, MG, PHOS in the last 168 hours. GFR: Estimated Creatinine Clearance: 67.4 mL/min (by C-G formula based on SCr of 0.51 mg/dL). Liver Function Tests: No results for input(s): AST, ALT, ALKPHOS, BILITOT, PROT, ALBUMIN in the last 168 hours. No results for input(s): LIPASE, AMYLASE in the last 168 hours. No  results for input(s): AMMONIA in the last 168 hours. Coagulation Profile: No results for input(s): INR, PROTIME in the last 168 hours. Cardiac Enzymes: No results for input(s): CKTOTAL, CKMB, CKMBINDEX, TROPONINI in the last 168 hours. BNP (last 3 results) No results for input(s): PROBNP in the last 8760 hours. HbA1C: No results for input(s): HGBA1C in the last 72 hours. CBG: Recent Labs  Lab 02/13/21 0527 02/13/21 0849 02/14/21 0529  02/15/21 0412 02/16/21 0619  GLUCAP 120* 86 147* 87 95   Lipid Profile: No results for input(s): CHOL, HDL, LDLCALC, TRIG, CHOLHDL, LDLDIRECT in the last 72 hours. Thyroid Function Tests: No results for input(s): TSH, T4TOTAL, FREET4, T3FREE, THYROIDAB in the last 72 hours. Anemia Panel: No results for input(s): VITAMINB12, FOLATE, FERRITIN, TIBC, IRON, RETICCTPCT in the last 72 hours. Sepsis Labs: No results for input(s): PROCALCITON, LATICACIDVEN in the last 168 hours.  No results found for this or any previous visit (from the past 240 hour(s)).       Radiology Studies: No results found.      Scheduled Meds:  amLODipine  5 mg Per Tube Daily   Chlorhexidine Gluconate Cloth  6 each Topical Daily   enoxaparin (LOVENOX) injection  40 mg Subcutaneous Q24H   feeding supplement (OSMOLITE 1.2 CAL)  355 mL Per Tube 5 X Daily   fluticasone  1 spray Each Nare BID   free water  150 mL Per Tube Q6H   glycopyrrolate  1 mg Oral BID   guaiFENesin  10 mL Per Tube Q8H   levETIRAcetam  750 mg Per Tube BID   levothyroxine  100 mcg Per Tube Q0600   polyethylene glycol  17 g Per Tube BID   QUEtiapine  50 mg Per Tube BID   senna  1 tablet Per Tube Daily   Continuous Infusions:   LOS: 64 days    Time spent: 22 minutes    Sharen Hones, MD [facility]   To contact the attending provider between 7A-7P or the covering provider during after hours 7P-7A, please log into the web site [email or web address] and access using universal [place] password for that web site. If you do not have the password, please call the hospital operator.  02/16/2021, 12:43 PM

## 2021-02-17 LAB — GLUCOSE, CAPILLARY: Glucose-Capillary: 87 mg/dL (ref 70–99)

## 2021-02-17 NOTE — Progress Notes (Signed)
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  Ongoing  GOAL:   Patient will meet greater than or equal to 90% of their needs  Met with TF  MONITOR:   TF tolerance, Labs, Weight trends, Vent status, I & O's  REASON FOR ASSESSMENT:   Consult Enteral/tube feeding initiation and management  ASSESSMENT:   [age] female with PMH of vocal cord paralysis with chronic tracheostomy, HTN, hx subarachnoid hemorrhage, chronic PEG tube, anxiety, and ventriculoperitoneal shunt for recurrent hydrocephalus [facility]. Patient was found in respiratory distress in her room with a tracheostomy tube on the floor.  Pt remains medically stable for discharge. TOC continues to work to locate placement. She remains difficult to place secondary to PEG and trach.    She continues to tolerate TF well.     Reviewed wt hx; wt has been stable over the past month.   Medications reviewed and include keppra and miralax.   Labs reviewed: CBGS: 86-147.  Diet Order:   Diet Order             Diet NPO time specified  Diet effective now                   EDUCATION NEEDS:   Education needs have been addressed  Skin:  Skin Assessment: Reviewed RN Assessment  Last BM:  02/14/21  Height:   Ht Readings from Last 1 Encounters:  12/14/20 5' 6"  (1.676 m)    Weight:   Wt Readings from Last 1 Encounters:  02/17/21 61.6 kg    Ideal Body Weight:  59.1 kg  BMI:  Body mass  index is 21.92 kg/m.  Estimated Nutritional Needs:   Kcal:  1800-2000 kcal  Protein:  90-100 g/d  Fluid:  1700-1900 mL/d    JenLoistine ChanceD, LDN, CDCThomastongistered Dietitian II Certified Diabetes Care and Education Specialist Please refer to AMIP[facility] Surgery Centerr RD and/or RD on-call/weekend/after hours pager

## 2021-02-17 NOTE — TOC Progression Note (Addendum)
Transition of Care [facility]) - Progression Note    Patient Details  Name: Stacy Moran MRN: [id number] Date of Birth: December 01, 1955  [facility]) CM/SW [place], LCSW Phone Number: 02/17/2021, 9:08 AM  Clinical Narrative: Left voicemail for [facility] admissions coordinator to check auth status.   2:31 pm: Insurance authorization approved. [facility] can accept her tomorrow and will need extra tubing for trach. Updated team and patient. Left voicemail for daughter.   4:39 pm: Updated daughter.  Expected Discharge Plan: [place] Barriers to Discharge: No SNF bed  Expected Discharge Plan and Services Expected Discharge Plan: [place]   Discharge Planning Services: CM Consult Post Acute Care Choice: [place] Living arrangements for the past 2 months: [place]                 DME Arranged: N/A DME Agency: NA       HH Arranged: NA HH Agency: NA         Social Determinants of Health (SDOH) Interventions    Readmission Risk Interventions Readmission Risk Prevention Plan 12/31/2020  Transportation Screening Complete  Medication Review Press photographer) Complete  PCP or Specialist appointment within 3-5 days of discharge Complete  HRI or Home Care Consult Complete  SW Recovery Care/Counseling Consult Complete  Palliative Care Screening Not [place] Complete

## 2021-02-17 NOTE — Progress Notes (Addendum)
PROGRESS NOTE    Stacy Moran  KDT:[id number] DOB: [birth year] DOA: 12/14/2020 PCP: [name], MD    Brief Narrative:  [phone] with a history of ruptured intracranial aneurysm and subsequent SHD/SAH, bilateral true vocal cord paralysis, chronic tracheostomy and PEG, HTN, epilepsy, hypothyroidism, and VP shunt for recurrent hydrocephalus who presented from her SNF due to respiratory distress after her tracheostomy tube apparently became dislodged.   On arrival, she was emergently taken to the OR, intubated, and her tracheostomy tube was replaced by ENT, Dr. Pryor Ochoa after dilation of the existing fistula.  She has subsequently stabilized.  Has been ready for discharge to skilled nursing facility for many days now,  however no SNF bed has been available.   Assessment & Plan:   Active Problems:   Tracheostomy status (HCC)   Acute respiratory failure (HCC)   PEG (percutaneous endoscopic gastrostomy) status ([place])   S/P percutaneous endoscopic gastrostomy (PEG) tube placement ([place])   Essential hypertension   Thrombocytopenia ([place])   History of hemorrhagic cerebrovascular accident (CVA) with residual deficit   Bilateral vocal cord paralysis   Leukopenia  Patient had increased airway secretion, Robinul was increased to 1 mg twice a day.  This has been better. Otherwise, there is no change in treatment.  She is pending for nursing home placement.  Insurance finally approved her SNF, facility can take her tomorrow.   DVT prophylaxis: Lovenox Code Status: full Family Communication:  Disposition Plan:      Status is: Inpatient   Remains inpatient appropriate because: Unsafe discharge        I/O last 3 completed shifts: In: 370 [Other:370] Out: -  No intake/output data recorded.     Subjective: Patient is sleeping well at night, she is still on 5 L oxygen over trach collar, no short of breath.  Airway secretion is much better. She is tolerating tube feeding without  nausea vomiting.  No diarrhea. No fever or chills.  Objective: Vitals:   02/17/21 0500 02/17/21 0527 02/17/21 0752 02/17/21 1137  BP:  99/66 116/71 111/72  Pulse:  74 76 77  Resp:  16 16 14   Temp:  97.8 F (36.6 C) 98.8 F (37.1 C) 98.8 F (37.1 C)  TempSrc:  Oral Oral   SpO2:  97% 99% 98%  Weight: 61.6 kg     Height:       No intake or output data in the 24 hours ending 02/17/21 1335 Filed Weights   02/15/21 0413 02/16/21 0500 02/17/21 0500  Weight: 62 kg 61.3 kg 61.6 kg    Examination:  General exam: Appears calm and comfortable  Respiratory system: Clear to auscultation. Respiratory effort normal. Cardiovascular system: S1 & S2 heard, RRR. No JVD, murmurs, rubs, gallops or clicks. No pedal edema. Gastrointestinal system: Abdomen is nondistended, soft and nontender. No organomegaly or masses felt. Normal bowel sounds heard. Central nervous system: Alert and oriented. No focal neurological deficits. Extremities: Symmetric 5 x 5 power. Skin: No rashes, lesions or ulcers Psychiatry:  Mood & affect appropriate.     Data Reviewed: I have personally reviewed following labs and imaging studies  CBC: Recent Labs  Lab 02/13/21 0513  WBC 3.7*  HGB 10.3*  HCT 33.6*  MCV 82.8  PLT 382*   Basic Metabolic Panel: No results for input(s): NA, K, CL, CO2, GLUCOSE, BUN, CREATININE, CALCIUM, MG, PHOS in the last 168 hours. GFR: Estimated Creatinine Clearance: 67.4 mL/min (by C-G formula based on SCr of 0.51 mg/dL). Liver Function Tests: No  results for input(s): AST, ALT, ALKPHOS, BILITOT, PROT, ALBUMIN in the last 168 hours. No results for input(s): LIPASE, AMYLASE in the last 168 hours. No results for input(s): AMMONIA in the last 168 hours. Coagulation Profile: No results for input(s): INR, PROTIME in the last 168 hours. Cardiac Enzymes: No results for input(s): CKTOTAL, CKMB, CKMBINDEX, TROPONINI in the last 168 hours. BNP (last 3 results) No results for input(s):  PROBNP in the last 8760 hours. HbA1C: No results for input(s): HGBA1C in the last 72 hours. CBG: Recent Labs  Lab 02/13/21 0849 02/14/21 0529 02/15/21 0412 02/16/21 0619 02/17/21 0528  GLUCAP 86 147* 87 95 87   Lipid Profile: No results for input(s): CHOL, HDL, LDLCALC, TRIG, CHOLHDL, LDLDIRECT in the last 72 hours. Thyroid Function Tests: No results for input(s): TSH, T4TOTAL, FREET4, T3FREE, THYROIDAB in the last 72 hours. Anemia Panel: No results for input(s): VITAMINB12, FOLATE, FERRITIN, TIBC, IRON, RETICCTPCT in the last 72 hours. Sepsis Labs: No results for input(s): PROCALCITON, LATICACIDVEN in the last 168 hours.  No results found for this or any previous visit (from the past 240 hour(s)).       Radiology Studies: No results found.      Scheduled Meds:  amLODipine  5 mg Per Tube Daily   Chlorhexidine Gluconate Cloth  6 each Topical Daily   enoxaparin (LOVENOX) injection  40 mg Subcutaneous Q24H   feeding supplement (OSMOLITE 1.2 CAL)  355 mL Per Tube 5 X Daily   fluticasone  1 spray Each Nare BID   free water  150 mL Per Tube Q6H   glycopyrrolate  1 mg Oral BID   guaiFENesin  10 mL Per Tube Q8H   levETIRAcetam  750 mg Per Tube BID   levothyroxine  100 mcg Per Tube Q0600   polyethylene glycol  17 g Per Tube BID   QUEtiapine  50 mg Per Tube BID   senna  1 tablet Per Tube Daily   Continuous Infusions:   LOS: 65 days    Time spent: 17 minutes    Sharen Hones, MD [facility]   To contact the attending provider between 7A-7P or the covering provider during after hours 7P-7A, please log into the web site [email or web address] and access using universal  password for that web site. If you do not have the password, please call the hospital operator.  02/17/2021, 1:35 PM

## 2021-02-18 DIAGNOSIS — I6930 Unspecified sequelae of cerebral infarction: Secondary | ICD-10-CM

## 2021-02-18 LAB — GLUCOSE, CAPILLARY: Glucose-Capillary: 80 mg/dL (ref 70–99)

## 2021-02-18 MED ORDER — ALPRAZOLAM 0.5 MG PO TABS: 0.5000 mg | ORAL_TABLET | Freq: Two times a day (BID) | ORAL | 0 refills | Status: AC | PRN

## 2021-02-18 MED ORDER — OSMOLITE 1.2 CAL PO LIQD: 355.0000 mL | Freq: Every day | ORAL | 0 refills | Status: DC

## 2021-02-18 MED ORDER — DOCUSATE SODIUM 50 MG/5ML PO LIQD: 100.0000 mg | Freq: Two times a day (BID) | ORAL | 0 refills | Status: DC | PRN

## 2021-02-18 MED ORDER — GUAIFENESIN 100 MG/5ML PO LIQD: 10.0000 mL | Freq: Three times a day (TID) | ORAL | 0 refills | Status: DC

## 2021-02-18 MED ORDER — FLUTICASONE PROPIONATE 50 MCG/ACT NA SUSP: 1.0000 | Freq: Two times a day (BID) | NASAL | 2 refills | Status: DC

## 2021-02-18 MED ORDER — AMLODIPINE BESYLATE 5 MG PO TABS: 5.0000 mg | ORAL_TABLET | Freq: Every day | ORAL | Status: DC

## 2021-02-18 MED ORDER — FREE WATER: 150.0000 mL | Freq: Four times a day (QID) | Status: DC

## 2021-02-18 MED ORDER — GLYCOPYRROLATE 1 MG PO TABS: 1.0000 mg | ORAL_TABLET | Freq: Two times a day (BID) | ORAL | Status: DC

## 2021-02-18 NOTE — Discharge Summary (Signed)
Stacy Moran CZY:[id number] DOB: February 01, 1956 DOA: 12/14/2020  PCP: [name], MD  Admit date: 12/14/2020 Discharge date: 02/18/2021  Admitted From: home Disposition:  SNF  Recommendations for Outpatient Follow-up:  Follow up with PCP in 1 week Please obtain BMP/CBC in one week 3.Follow up with ENT Dr. Pryor Ochoa  in 2 weeks     Discharge Condition:Stable CODE STATUS:Full  Diet recommendation: NPO , receiving Feeding via PEG.   Brief/Interim Summary: Per review of chart and HPI: 65 year old female presenting at [facility] ED on 12/14/2020 from [place] via EMS after patient called out to staff was found in her room a little after midnight in respiratory distress with her tracheostomy on the floor.  Of note this is her 5th visit to [facility] ED due to tracheostomy complications since August 2022.  She initially had a #6 Shiley which recently had to be replaced with a #4 Shiley.  Other complications include obstruction requiring suctioning and replacement.  Per EDP handoff report and documentation patient is not ventilator dependent but is on oxygen at night. ED course: Initial vitals: Afebrile at 98.5, RR 18, tachycardic at 119, hypertensive at 185/104 with SPO2 98% on room air.  Shortly after arrival SPO2 dropped into the 40s per EDP handoff report and patient was placed on nonrebreather with improvement in SPO2 to 100%. Significant labs: Mild hypokalemia at 3.4, hyperglycemia at 222, mild leukocytosis at 10.6, remainder of CBC and chemistry values WNL CXR 12/14/2020: Redemonstrated ventriculoperitoneal shunt.  Lower lung volumes with bibasilar atelectasis no other focal pulmonary opacity.   RT was unable to replace tracheostomy through existing stoma and ENT was emergently called bedside to replace tracheostomy. Dr. Pryor Ochoa with ENT was unable to replace tracheostomy and patient was taken emergently to OR for intubation. After sedation and endotracheal intubation, bubbles were visible  from the previous tracheo-cutaneous fistula and Dr. Pryor Ochoa was successful in placing a #4 cuffed Shiley tracheostomy tube. PCCM was consulted for admission and patient was transported to ICU for mechanical ventilation support.patient was extubated the next day and requiring pressors. She was then transferred to the medical floor.   She has been waiting for placement for a while now.  She is stable to be discharged today.   Acute respiratory failure with hypoxia due to displaced tracheostomy in the setting of chronic bilateral vocal cord dysfunction ENT consulted, patient underwent ET tube replacement in the OR.  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 She has had teaching from RT on her new trach.  Remains stable for discharge - May have as needed albuterol Follow-up with ENT in 2 weeks    Dysphagia due to bilateral vocal cord dysfunction - Continue tube feeds    Seizures No seizures since admission - Continue Keppra   History of subarachnoid hemorrhage History of subdural hematoma Recurrent hydrocephalus History of VP shunt    Mood disorder - Continue Seroquel   Essential hypertension Blood pressure is reasonably well controlled.  Continue amlodipine.   Postoperative hypothyroidism Continue levothyroxine   Type 2 diabetes mellitus without complication Had few episodes of hypoglycemia.  Her sliding scale insulin was discontinued Monitor fingersticks   Discharge Diagnoses:  Active Problems:   Tracheostomy status (HCC)   Acute respiratory failure (HCC)   PEG (percutaneous endoscopic gastrostomy) status ([place])   S/P percutaneous endoscopic gastrostomy (PEG) tube placement ([place])   Essential hypertension   Thrombocytopenia ([place])   History of hemorrhagic cerebrovascular accident (CVA) with residual deficit   Bilateral vocal  cord paralysis   Leukopenia    Discharge Instructions   Allergies as of 02/18/2021   No Known  Allergies      Medication List     STOP taking these medications    amoxicillin-clavulanate 250-62.5 MG/5ML suspension Commonly known as: AUGMENTIN   Probiotic NICU Liqd Commonly known as: GERBER SOOTHE       TAKE these medications    acetaminophen 325 MG tablet Commonly known as: TYLENOL Place 2 tablets (650 mg total) into feeding tube every 6 (six) hours as needed for mild pain.   ALPRAZolam 0.5 MG tablet Commonly known as: XANAX Place 1 tablet (0.5 mg total) into feeding tube 2 (two) times daily as needed for up to 2 days for anxiety.   amLODipine 5 MG tablet Commonly known as: NORVASC Place 1 tablet (5 mg total) into feeding tube daily. Start taking on: February 19, 2021 What changed:  medication strength how much to take how to take this when to take this   chlorhexidine 0.12 % solution Commonly known as: PERIDEX 15 mLs by Mouth Rinse route 2 (two) times daily.   docusate 50 MG/5ML liquid Commonly known as: COLACE Place 10 mLs (100 mg total) into feeding tube 2 (two) times daily as needed for mild constipation.   feeding supplement (OSMOLITE 1.2 CAL) Liqd Place 355 mLs into feeding tube 5 (five) times daily. What changed:  when to take this Another medication with the same name was removed. Continue taking this medication, and follow the directions you see here.   fluticasone 50 MCG/ACT nasal spray Commonly known as: FLONASE Place 1 spray into both nostrils 2 (two) times daily.   free water Soln Place 150 mLs into feeding tube every 6 (six) hours. What changed: how much to take   glycopyrrolate 1 MG tablet Commonly known as: ROBINUL Take 1 tablet (1 mg total) by mouth 2 (two) times daily.   guaiFENesin 100 MG/5ML liquid Commonly known as: ROBITUSSIN Place 10 mLs into feeding tube every 8 (eight) hours.   ipratropium-albuterol 0.5-2.5 (3) MG/3ML Soln Commonly known as: DUONEB Take 3 mLs by nebulization every 4 (four) hours as needed.    levETIRAcetam 100 MG/ML solution Commonly known as: KEPPRA Place 7.5 mLs (750 mg total) into feeding tube 2 (two) times daily.   levothyroxine 100 MCG tablet Commonly known as: SYNTHROID Place 1 tablet (100 mcg total) into feeding tube daily at 6 (six) AM.   polyethylene glycol 17 g packet Commonly known as: MIRALAX / GLYCOLAX Place 17 g into feeding tube 2 (two) times daily.   QUEtiapine 50 MG tablet Commonly known as: SEROQUEL Place 1 tablet (50 mg total) into feeding tube 2 (two) times daily. What changed:  how much to take how to take this when to take this   senna 8.6 MG Tabs tablet Commonly known as: SENOKOT Place 1 tablet (8.6 mg total) into feeding tube daily.        Follow-up Information     [name], MD Follow up in 1 week(s).   Specialty: Internal Medicine Contact information: [place] [address] [phone]         Carloyn Manner, MD Follow up in 2 week(s).   Specialty: Otolaryngology Contact information: [place] [phone]                No Known Allergies  Consultations: PCCM, ENT   Procedures/Studies: [facility] Chest [facility] 1 View  Result  Date: 01/19/2021 CLINICAL DATA:  Cough. EXAM: PORTABLE CHEST 1 VIEW COMPARISON:  Chest radiograph 12/14/2020. FINDINGS: Similar streaky left basilar opacities. No visible pleural effusions or pneumothorax. No tracheostomy tube tip projects midline over the tracheal air column at the level of the clavicular heads. Catheter tubing courses along the imaged lower right neck, right chest and right upper abdomen, probably ventricular shunt catheter. Cardiomediastinal silhouette is within normal limits and unchanged. IMPRESSION: 1. Similar streaky left basilar opacities, most likely atelectasis. Infection and/or aspiration is not excluded. 2. New tracheostomy tube tip projects midline over the tracheal air column at the level of  the clavicular heads. Electronically Signed   By: Margaretha Sheffield M.D.   On: 01/19/2021 19:51      Subjective: Has no complaints.  No dizziness or shortness of breath.  Discharge Exam: Vitals:   02/18/21 0749 02/18/21 1116  BP: 105/68 (!) 101/55  Pulse: 78 74  Resp: 16 18  Temp: (!) 97.4 F (36.3 C) 98.1 F (36.7 C)  SpO2: 95% 98%   Vitals:   02/17/21 1707 02/17/21 1938 02/18/21 0749 02/18/21 1116  BP: 110/68 132/84 105/68 (!) 101/55  Pulse: 86 87 78 74  Resp: 16 16 16 18   Temp: 97.7 F (36.5 C) 98.7 F (37.1 C) (!) 97.4 F (36.3 C) 98.1 F (36.7 C)  TempSrc:   Oral Oral  SpO2: 99% 100% 95% 98%  Weight:      Height:        General: Pt is alert, awake, not in acute distress Cardiovascular: RRR, S1/S2 +, no rubs, no gallops Respiratory: CTA bilaterally, no wheezing, no rhonchi Abdominal: Soft, NT, ND, bowel sounds + PEG in place Extremities: no edema, no cyanosis    The results of significant diagnostics from this hospitalization (including imaging, microbiology, ancillary and laboratory) are listed below for reference.     Microbiology: No results found for this or any previous visit (from the past 240 hour(s)).   Labs: BNP (last 3 results) No results for input(s): BNP in the last 8760 hours. Basic Metabolic Panel: No results for input(s): NA, K, CL, CO2, GLUCOSE, BUN, CREATININE, CALCIUM, MG, PHOS in the last 168 hours. Liver Function Tests: No results for input(s): AST, ALT, ALKPHOS, BILITOT, PROT, ALBUMIN in the last 168 hours. No results for input(s): LIPASE, AMYLASE in the last 168 hours. No results for input(s): AMMONIA in the last 168 hours. CBC: Recent Labs  Lab 02/13/21 0513  WBC 3.7*  HGB 10.3*  HCT 33.6*  MCV 82.8  PLT 125*   Cardiac Enzymes: No results for input(s): CKTOTAL, CKMB, CKMBINDEX, TROPONINI in the last 168 hours. BNP: Invalid input(s): POCBNP CBG: Recent Labs  Lab 02/14/21 0529 02/15/21 0412 02/16/21 0619  02/17/21 0528 02/18/21 0751  GLUCAP 147* 87 95 87 80   D-Dimer No results for input(s): DDIMER in the last 72 hours. Hgb A1c No results for input(s): HGBA1C in the last 72 hours. Lipid Profile No results for input(s): CHOL, HDL, LDLCALC, TRIG, CHOLHDL, LDLDIRECT in the last 72 hours. Thyroid function studies No results for input(s): TSH, T4TOTAL, T3FREE, THYROIDAB in the last 72 hours.  Invalid input(s): FREET3 Anemia work up No results for input(s): VITAMINB12, FOLATE, FERRITIN, TIBC, IRON, RETICCTPCT in the last 72 hours. Urinalysis    Component Value Date/Time   COLORURINE YELLOW 07/30/2020 2343   APPEARANCEUR CLEAR 07/30/2020 2343   LABSPEC 1.013 07/30/2020 2343   PHURINE 7.0 07/30/2020 2343   GLUCOSEU NEGATIVE 07/30/2020 2343   HGBUR NEGATIVE  07/30/2020 [place] 07/30/2020 2343   [place] 07/30/2020 2343   PROTEINUR NEGATIVE 07/30/2020 2343   NITRITE NEGATIVE 07/30/2020 2343   LEUKOCYTESUR NEGATIVE 07/30/2020 2343   Sepsis Labs Invalid input(s): PROCALCITONIN,  WBC,  LACTICIDVEN Microbiology No results found for this or any previous visit (from the past 240 hour(s)).   Time coordinating discharge: Over 30 minutes  SIGNED:   Nolberto Hanlon, MD  [facility] 02/18/2021, 1:29 PM Pager   If 7PM-7AM, please contact night-coverage [email or web address] Password [email or web address]

## 2021-02-18 NOTE — TOC Transition Note (Signed)
Transition of Care [facility]) - CM/SW Discharge Note   Patient Details  Name: Stacy Moran MRN: [id number] Date of Birth: [birth year]  Transition of [facility]) CM/SW Contact:  [name], RN Phone Number: 02/18/2021, 2:07 PM   Clinical Narrative:    Patient will discharge to [facility] today.  Patient's daughter, [name], updated on discharge today.  Bedside RN will call report to [place].  RNCM has arranged EMS transport- patient is 4th on the list for pick up.  [facility] has asked for some trach supplies to be sent with patient- asked RT and RN to get supplies together for a couple of days to send with patient.     Final next level of care: [place] Barriers to Discharge: Barriers Resolved   Patient Goals and CMS Choice Patient states their goals for this hospitalization and ongoing recovery are:: Going to [place] for LTC CMS Medicare.gov Compare Post Acute Care list provided to:: Patient Represenative (must comment) Choice offered to / list presented to : Adult Children  Discharge Placement              Patient chooses bed at: Other - please specify in the comment section below: ([place]) Patient to be transferred to facility by: [facility] Name of family member notified: [name] Patient and family notified of of transfer: 02/18/21  Discharge Plan and Services   Discharge Planning Services: CM Consult Post Acute Care Choice: [place]          DME Arranged: N/A DME Agency: NA       HH Arranged: NA HH Agency: NA        Social Determinants of Health (SDOH) Interventions     Readmission Risk Interventions Readmission Risk Prevention Plan 12/31/2020  Transportation Screening Complete  Medication Review Press photographer) Complete  PCP or Specialist appointment within 3-5 days of discharge Complete  HRI or Home Care Consult Complete  SW Recovery Care/Counseling Consult Complete   [place] Complete

## 2021-02-18 NOTE — Progress Notes (Signed)
Called report to [facility] LPN gave report regarding patient status. Stated that a nurse trained in trach care would be able to care for trach needs. Gathering all items now, patient to be sent via EMS.

## 2021-02-18 NOTE — NC FL2 (Signed)
[place] LEVEL OF CARE SCREENING TOOL     IDENTIFICATION  Patient Name: Stacy Moran Birthdate: Jul 17, 1955 Sex: female Admission Date (Current Location): 12/14/2020  [facility] and [place] Number:  Engineering geologist and Address:  [facility], [address], [place], [place] [address]      Provider Number: [id number]  Attending Physician Name and Address:  Nolberto Hanlon, MD  Relative Name and Phone Number:  [name] (daughter) [phone]    Current Level of Care: Hospital Recommended Level of Care: [place] Prior Approval Number:    Date Approved/Denied:   PASRR Number: [id number]  Discharge Plan: SNF    Current Diagnoses: Patient Active Problem List   Diagnosis Date Noted   Leukopenia 02/11/2021   Bilateral vocal cord paralysis 01/14/2021   Fall (on) (from) other stairs and steps, initial encounter 08/26/2020   Subdural hematoma 07/31/2020   History of hemorrhagic cerebrovascular accident (CVA) with residual deficit 06/06/2020   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   S/P ventriculoperitoneal shunt 06/06/2020   Functional urinary incontinence 06/06/2020   Ineffective airway clearance    Thrombocytopenia (HCC)    Restlessness    Restlessness and agitation    Acute blood loss anemia    Essential hypertension    Seizure prophylaxis    Protein-calorie malnutrition, severe 04/24/2020   Dysphagia, post-stroke    Dysphagia    Status post tracheostomy ([place])    S/P percutaneous endoscopic gastrostomy (PEG) tube placement ([place])    ICH (intracerebral hemorrhage) ([place]) 04/22/2020   Obstructive hydrocephalus ([place])    Seizure-like activity ([place]) 03/12/2020   PEG (percutaneous endoscopic gastrostomy) status ([place]) 03/08/2020   Hyperglycemia 03/08/2020   Acute respiratory failure (HCC)    Ventilator dependence ([place])    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/19/2020   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   Tracheostomy status ([place]) 02/19/2020   Abdominal distention    Ruptured aneurysm of artery (HCC)    SAH (subarachnoid hemorrhage) (HCC)    Subarachnoid bleed (HCC)    Tachypnea    Prediabetes    Hypokalemia    Leukocytosis    Ileus, postoperative (HCC)    Pressure injury of skin 02/13/2020   Ruptured cerebral aneurysm ([place]) 01/30/2020    Orientation RESPIRATION BLADDER Height & Weight     Self, Situation, Place  Normal, Tracheostomy Continent Weight: 61.6 kg Height:  5\' 6"  (167.6 cm)  BEHAVIORAL SYMPTOMS/MOOD NEUROLOGICAL BOWEL NUTRITION STATUS      Continent Feeding tube (osmolite 1.2- 4 times per day bolus)  AMBULATORY STATUS COMMUNICATION OF NEEDS Skin   Supervision Verbally Normal                       Personal Care Assistance Level of Assistance  Bathing, Feeding, Dressing Bathing Assistance: Limited assistance Feeding assistance: Limited assistance Dressing Assistance: Limited assistance     Functional Limitations Info  Sight, Hearing, Speech Sight Info: Adequate Hearing Info: Adequate Speech Info: Impaired (passy muir)    SPECIAL CARE FACTORS FREQUENCY                       Contractures Contractures Info: Not present    Additional Factors Info  Code Status, Allergies Code Status Info: Full Allergies Info: NKA           Current Medications (02/18/2021):  This is the current hospital active medication list Current Facility-Administered Medications  Medication Dose Route Frequency Provider Last Rate Last Admin  acetaminophen (TYLENOL) tablet 500 mg  500 mg Per Tube Q6H PRN Cherene Altes, MD   500 mg at 01/10/21 2103   ALPRAZolam Duanne Moron) tablet 0.5 mg  0.5 mg Per Tube TID PRN Max Sane, MD   0.5 mg at 02/17/21 2203   amLODipine (NORVASC) tablet 5 mg  5 mg Per Tube Daily Nicole Kindred A, DO   5 mg at 02/18/21 1013   Chlorhexidine Gluconate Cloth 2 % PADS 6 each  6 each Topical Daily Flora Lipps, MD   6 each at 02/18/21  1014   docusate (COLACE) 50 MG/5ML liquid 100 mg  100 mg Per Tube BID PRN Brantley Stage, Walid A, RPH       enoxaparin (LOVENOX) injection 40 mg  40 mg Subcutaneous Q24H Joette Catching T, MD   40 mg at 02/18/21 1014   feeding supplement (OSMOLITE 1.2 CAL) liquid 355 mL  355 mL Per Tube 5 X Daily Joette Catching T, MD 0 mL/hr at 02/05/21 0557 355 mL at 02/18/21 1014   fluticasone (FLONASE) 50 MCG/ACT nasal spray 1 spray  1 spray Each Nare BID Sharen Hones, MD   1 spray at 02/18/21 1014   free water 150 mL  150 mL Per Tube Q6H Joette Catching T, MD   150 mL at 02/18/21 1150   glycopyrrolate (ROBINUL) tablet 1 mg  1 mg Oral BID Sharen Hones, MD   1 mg at 02/18/21 1014   guaiFENesin (ROBITUSSIN) 100 MG/5ML liquid 10 mL  10 mL Per Tube Q8H Amery, Sahar, MD   10 mL at 02/18/21 0557   ipratropium-albuterol (DUONEB) 0.5-2.5 (3) MG/3ML nebulizer solution 3 mL  3 mL Nebulization Q4H PRN Sharion Settler, NP   3 mL at 02/18/21 0723   levETIRAcetam (KEPPRA) 100 MG/ML solution 750 mg  750 mg Per Tube BID Rust-Chester, Britton L, NP   750 mg at 02/18/21 1014   levothyroxine (SYNTHROID) tablet 100 mcg  100 mcg Per Tube Q0600 Rust-Chester, Britton L, NP   100 mcg at 02/18/21 0557   polyethylene glycol (MIRALAX / GLYCOLAX) packet 17 g  17 g Per Tube BID Rust-Chester, Britton L, NP   17 g at 02/18/21 1014   QUEtiapine (SEROQUEL) tablet 50 mg  50 mg Per Tube BID Rust-Chester, Toribio Harbour L, NP   50 mg at 02/18/21 1014   senna (SENOKOT) tablet 8.6 mg  1 tablet Per Tube Daily Rust-Chester, Britton L, NP   8.6 mg at 02/18/21 1014     Discharge Medications: Please see discharge summary for a list of discharge medications.  Relevant Imaging Results:  Relevant Lab Results:   Additional Information SS#: [id number]  [name], RN

## 2021-03-27 ENCOUNTER — Emergency Department (HOSPITAL_COMMUNITY)
Admission: EM | Admit: 2021-03-27 | Discharge: 2021-03-27 | Disposition: A | Discharge status: Skilled Nursing Facility | Payer: Medicaid Other | Attending: Emergency Medicine | Admitting: Emergency Medicine

## 2021-03-27 ENCOUNTER — Encounter (HOSPITAL_COMMUNITY): Payer: Self-pay | Admitting: Emergency Medicine

## 2021-03-27 DIAGNOSIS — F419 Anxiety disorder, unspecified: Secondary | ICD-10-CM | POA: Insufficient documentation

## 2021-03-27 DIAGNOSIS — Z9189 Other specified personal risk factors, not elsewhere classified: Secondary | ICD-10-CM | POA: Diagnosis not present

## 2021-03-27 NOTE — ED Triage Notes (Addendum)
Pt sent over by [facility] for concerns of low O2. Pt given ativan earlier tonight for anxiety. Pt arrives with no medical complaints. Pt O2 sats 98% on RA. Pt was placed on [place] at facility.   Per EMS facility pulse ox not working properly.

## 2021-03-27 NOTE — Progress Notes (Signed)
Rt cleaned around trach to remove small amount of drainage with a water to gauze dressing. A clean dressing was placed under trach to protect skin from drainage. RT removed inner cannula and it was clean and no farther steps needed to be taken to clean device. Patient mention she does wear a Darron Doom Mui er valve to speak and has her trach suctioned at the facility; she didn't need that here tonight. Patient stated she is on trach collar with O2 and is currently on room air saturations 98% HR 76 RR 16 BP 136/87.

## 2021-03-27 NOTE — Discharge Instructions (Signed)
You were evaluated in the Emergency Department and after careful evaluation, we did not find any emergent condition requiring admission or further testing in the hospital.  Your exam/testing today was overall reassuring.  Please return to the Emergency Department if you experience any worsening of your condition.  Thank you for allowing us to be a part of your care.  

## 2021-03-27 NOTE — ED Provider Notes (Signed)
[facility] Emergency Department Provider Note MRN:  [id number]  Arrival date & time: 03/27/21     Chief Complaint   Facility Request   History of Present Illness   Stacy Moran is a 65 y.o. year-old female with a history of chronic respiratory failure presenting to the ED with chief complaint of facility request.  Reportedly patient was feeling a bit anxious this evening and was given some Ativan at the facility.  They checked on her and she was looking and feeling better.  Her oxygen saturation numbers were low and they called EMS.  EMS noted that her oxygen saturation on their monitoring was normal and they felt that the facility numbers were inaccurate and/or the facility pulse ox machine was not functioning properly.  Facility requested that she still be evaluated.  Patient has no complaints, she did not want to come, she has not felt sick lately, she denies shortness of breath, she feels fine.  Review of Systems  A complete 10 system review of systems was obtained and all systems are negative except as noted in the HPI and PMH.   Patient's Health History    Past Medical History:  Diagnosis Date   Acute respiratory failure ([place])    Hypertension    Prosthetic eye globe    Ruptured aneurysm of artery (HCC)    Seizures ([place])    Status post insertion of percutaneous endoscopic gastrostomy (PEG) tube ([place])    Subdural hematoma    Tracheostomy dependent ([place])    Tracheostomy present [facility])     Past Surgical History:  Procedure Laterality Date   BURR HOLE N/A 03/18/2020   Procedure: Haskell Flirt;  Surgeon: [name], MD;  Location: [place];  Service: Neurosurgery;  Laterality: N/A;   CRANIOTOMY Left 01/30/2020   Procedure: LEFT FAR LATERAL CRANIOTOMY FOR ANEURYSM CLIPPING;  Surgeon: [name], MD;  Location: [place];  Service: Neurosurgery;  Laterality: Left;   DIRECT LARYNGOSCOPY N/A 02/29/2020   Procedure: DIRECT LARYNGOSCOPY;  Surgeon: [name], MD;  Location: [place];  Service: ENT;  Laterality: N/A;   DIRECT LARYNGOSCOPY N/A 10/03/2020   Procedure: DIRECT LARYNGOSCOPY w/esophagascopy;  Surgeon: [name], MD;  Location: [place];  Service: ENT;  Laterality: N/A;   INTUBATION-ENDOTRACHEAL WITH TRACHEOSTOMY STANDBY N/A 12/14/2020   Procedure: Warrick Parisian WITH TRACHEOSTOMY STANDBY;  Surgeon: Carloyn Manner, MD;  Location: [facility];  Service: ENT;  Laterality: N/A;   IR ANGIO INTRA EXTRACRAN SEL INTERNAL CAROTID BILAT MOD SED  01/30/2020   IR ANGIO VERTEBRAL SEL VERTEBRAL UNI L MOD SED  01/30/2020   IR CM INJ ANY COLONIC TUBE W/FLUORO  07/03/2020   IR GASTROSTOMY TUBE MOD SED  02/22/2020   IR [place] GASTRO/COLONIC TUBE PERCUT W/FLUORO  07/03/2020   LAPAROSCOPIC REVISION VENTRICULAR-PERITONEAL (V-P) SHUNT N/A 03/18/2020   Procedure: LAPAROSCOPIC REVISION VENTRICULAR-PERITONEAL (V-P) SHUNT;  Surgeon: [name], MD;  Location: [place];  Service: General;  Laterality: N/A;   RADIOLOGY WITH ANESTHESIA N/A 01/30/2020   Procedure: IR WITH ANESTHESIA;  Surgeon: [name], MD;  Location: [place];  Service: Radiology;  Laterality: N/A;   THYROIDECTOMY N/A 02/08/2020   Procedure: THYROIDECTOMY;  Surgeon: [name], MD;  Location: [place];  Service: ENT;  Laterality: N/A;   TRACHEOSTOMY TUBE PLACEMENT N/A 02/08/2020   Procedure: TRACHEOSTOMY;  Surgeon: [name], MD;  Location: [place];  Service: ENT;  Laterality: N/A;   TRACHEOSTOMY TUBE PLACEMENT N/A 02/29/2020   Procedure: TRACHEOSTOMY EXCHANGE;  Surgeon: [name],  MD;  Location: [place];  Service: ENT;  Laterality: N/A;   VENTRICULOPERITONEAL SHUNT N/A 03/18/2020   Procedure: RIGHT SHUNT INSERTION VENTRICULAR-PERITONEAL/ BURR HOLE Evacuation of Subdural Hematoma;  Surgeon: [name], MD;  Location: [place];  Service: Neurosurgery;  Laterality: N/A;    History reviewed. No pertinent family history.  Social History   Socioeconomic History   Marital status: Single     Spouse name: Not on file   Number of children: Not on file   Years of education: Not on file   Highest education level: Not on file  Occupational History   Not on file  Tobacco Use   Smoking status: Former   Smokeless tobacco: Never  Vaping Use   Vaping Use: Never used  Substance and Sexual Activity   Alcohol use: Not Currently   Drug use: Not Currently   Sexual activity: Not on file  Other Topics Concern   Not on file  Social History Narrative   Not on file   Social Determinants of Health   Financial Resource Strain: Not on file  Food Insecurity: Not on file  Transportation Needs: Not on file  Physical Activity: Not on file  Stress: Not on file  Social Connections: Not on file  Intimate Partner Violence: Not on file     Physical Exam   Vitals:   03/27/21 0230 03/27/21 0300  BP: 133/85 105/76  Pulse: 76 75  Resp: 16 14  Temp:  (!) 97.2 F (36.2 C)  SpO2: 98% 97%    CONSTITUTIONAL: Well-appearing, NAD NEURO:  Alert and oriented x 3, no focal deficits EYES:  eyes equal and reactive ENT/NECK:  no LAD, no JVD CARDIO: Regular rate, well-perfused, normal S1 and S2 PULM:  CTAB no wheezing or rhonchi GI/GU:  normal bowel sounds, non-distended, non-tender MSK/SPINE:  No gross deformities, no edema SKIN:  no rash, atraumatic PSYCH:  Appropriate speech and behavior  *Additional and/or pertinent findings included in MDM below  Diagnostic and Interventional Summary    EKG Interpretation  Date/Time:    Ventricular Rate:    PR Interval:    QRS Duration:   QT Interval:    QTC Calculation:   R Axis:     Text Interpretation:         Labs Reviewed - No data to display  No orders to display    Medications - No data to display   Procedures  /  Critical Care Procedures  ED Course and Medical Decision Making  I have reviewed the triage vital signs, the nursing notes, and pertinent available records from the EMR.  Listed above are laboratory and imaging  tests that I personally ordered, reviewed, and interpreted and then considered in my medical decision making (see below for details).  Suspect error in pulse ox reading at facility.  Patient is sitting comfortably with normal oxygen saturations here.  Lungs are clear.  She denies any complaints whatsoever.  Observed for 1 hour with no change in clinical status, appropriate for discharge.       Barth Kirks. [place]onia Small, [place] [email or web address]  Final Clinical Impressions(s) / ED Diagnoses     ICD-10-CM   1. Potential alteration in oxygenation  Z91.89       ED Discharge Orders     None        Discharge Instructions Discussed with and Provided to Patient:    Discharge Instructions      You were evaluated in  the Emergency Department and after careful evaluation, we did not find any emergent condition requiring admission or further testing in the hospital.  Your exam/testing today was overall reassuring.  Please return to the Emergency Department if you experience any worsening of your condition.  Thank you for allowing [place] to be a part of your care.        Maudie Flakes, MD 03/27/21 [phone]

## 2021-05-24 ENCOUNTER — Emergency Department (HOSPITAL_COMMUNITY)
Admission: EM | Admit: 2021-05-24 | Discharge: 2021-05-25 | Disposition: A | Discharge status: Skilled Nursing Facility | Payer: Medicaid Other | Attending: Emergency Medicine | Admitting: Emergency Medicine

## 2021-05-24 ENCOUNTER — Emergency Department (HOSPITAL_COMMUNITY): Payer: Medicaid Other

## 2021-05-24 ENCOUNTER — Other Ambulatory Visit: Payer: Self-pay

## 2021-05-24 ENCOUNTER — Encounter (HOSPITAL_COMMUNITY): Payer: Self-pay | Admitting: Emergency Medicine

## 2021-05-24 DIAGNOSIS — J9500 Unspecified tracheostomy complication: Secondary | ICD-10-CM

## 2021-05-24 DIAGNOSIS — R7309 Other abnormal glucose: Secondary | ICD-10-CM | POA: Diagnosis not present

## 2021-05-24 DIAGNOSIS — I10 Essential (primary) hypertension: Secondary | ICD-10-CM | POA: Diagnosis not present

## 2021-05-24 DIAGNOSIS — D696 Thrombocytopenia, unspecified: Secondary | ICD-10-CM | POA: Insufficient documentation

## 2021-05-24 DIAGNOSIS — R7989 Other specified abnormal findings of blood chemistry: Secondary | ICD-10-CM | POA: Diagnosis not present

## 2021-05-24 DIAGNOSIS — Z79899 Other long term (current) drug therapy: Secondary | ICD-10-CM | POA: Diagnosis not present

## 2021-05-24 HISTORY — DX: Cerebral infarction, unspecified: I63.9

## 2021-05-24 LAB — BASIC METABOLIC PANEL
Anion gap: 5 (ref 5–15)
BUN: 24 mg/dL — ABNORMAL HIGH (ref 8–23)
CO2: 30 mmol/L (ref 22–32)
Calcium: 9.5 mg/dL (ref 8.9–10.3)
Chloride: 105 mmol/L (ref 98–111)
Creatinine, Ser: 0.55 mg/dL (ref 0.44–1.00)
GFR, Estimated: >60 mL/min (ref >60)
Glucose, Bld: 110 mg/dL — ABNORMAL HIGH (ref 70–99)
Potassium: 4.1 mmol/L (ref 3.5–5.1)
Sodium: 140 mmol/L (ref 135–145)

## 2021-05-24 LAB — CBC
HCT: 40.2 % (ref 36.0–46.0)
Hemoglobin: 12.8 g/dL (ref 12.0–15.0)
MCH: 26.2 pg (ref 26.0–34.0)
MCHC: 31.8 g/dL (ref 30.0–36.0)
MCV: 82.4 fL (ref 80.0–100.0)
Platelets: 129 10*3/uL — ABNORMAL LOW (ref 150–400)
RBC: 4.88 MIL/uL (ref 3.87–5.11)
RDW: 13.8 % (ref 11.5–15.5)
WBC: 5.1 10*3/uL (ref 4.0–10.5)
nRBC: 0 % (ref 0.0–0.2)

## 2021-05-24 IMAGING — DX DG CHEST 1V PORT
1 series · 2 of 2 positions shown · non-contrast
Comparison: Chest radiograph dated [DATE].

CLINICAL DATA: Congestion.

EXAM:
PORTABLE CHEST 1 VIEW

[Series 1: chest ap · 0.14mm/px · 2 of 2 slices shown]
[im 1/2]
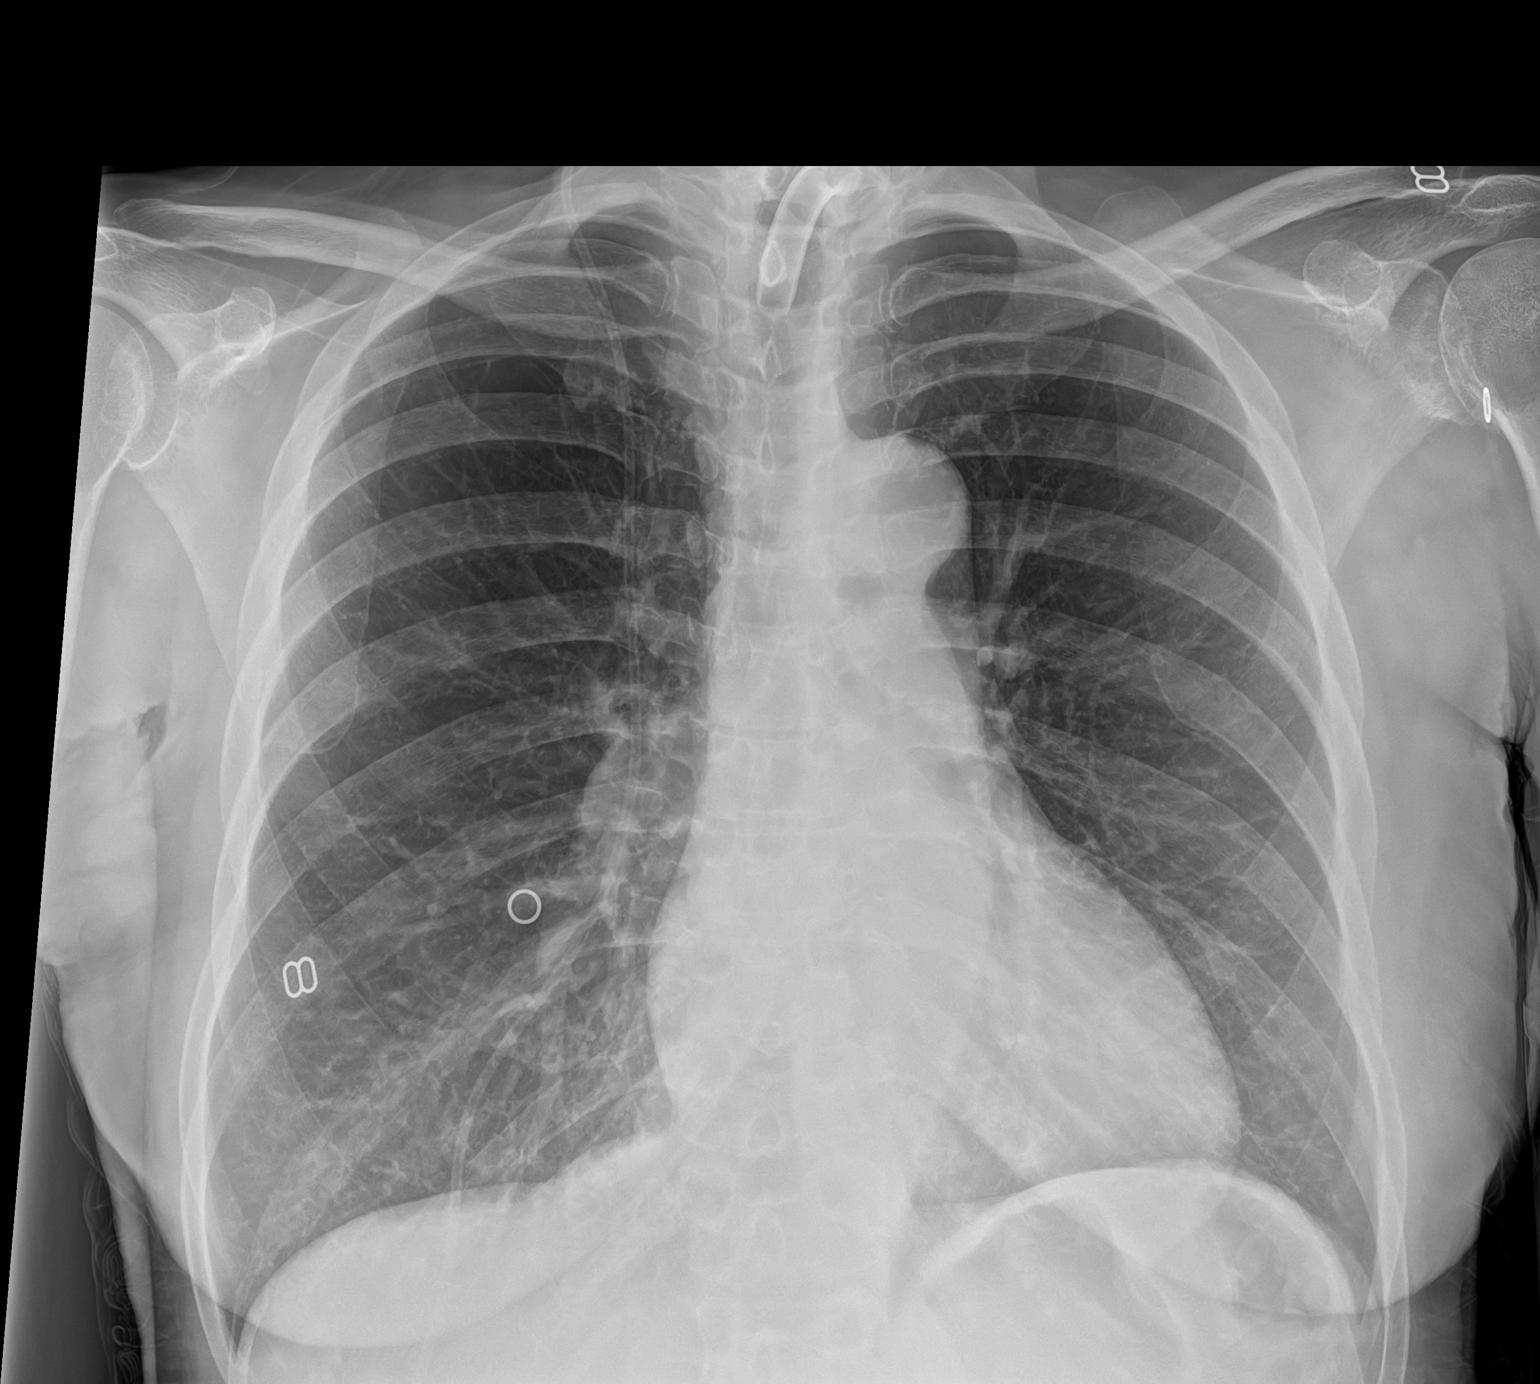
[im 2/2]
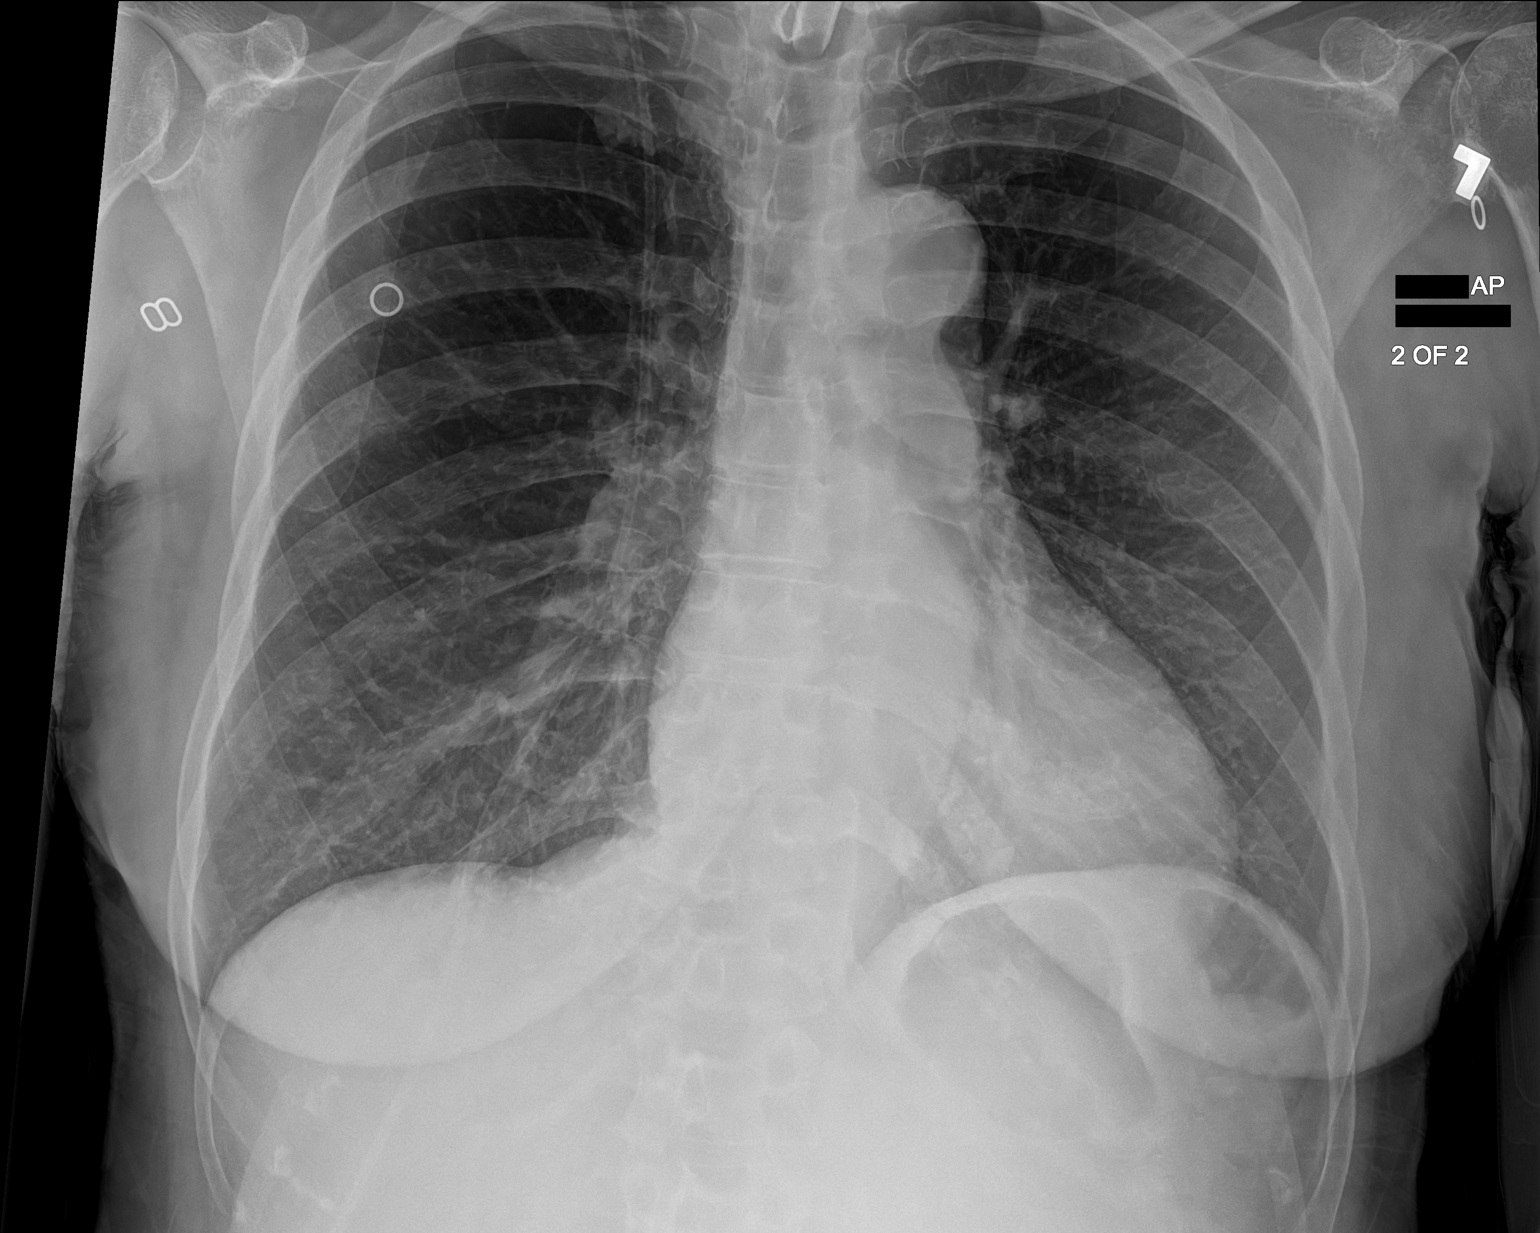

[2 of 2 positions shown; findings below may reference images not displayed]

FINDINGS: Tracheostomy above the carina. No focal consolidation, pleural
effusion, pneumothorax the cardiac silhouette is within limits.
Atherosclerotic calcification of the aorta. No acute osseous
pathology. Partially visualized VP shunt over the right chest. The
visualized portion of the shunt appears intact.
IMPRESSION: No active cardiopulmonary disease.

## 2021-05-24 MED ORDER — IPRATROPIUM-ALBUTEROL 0.5-2.5 (3) MG/3ML IN SOLN
3.0000 mL | Freq: Once | RESPIRATORY_TRACT | Status: AC
  Administered 2021-05-24: 3 mL via RESPIRATORY_TRACT
  Filled 2021-05-24: qty 3

## 2021-05-24 NOTE — ED Triage Notes (Signed)
Pt from [facility] home. They checked her sats and was 88%. Pt not on 02. Pt has trach with noted congestion. A/o. Ambulatory. Pt denies pain or sob. Does say she feels congestied in her trach.

## 2021-05-24 NOTE — ED Notes (Signed)
C-com notified of patient needing transportation back to [facility]. ?

## 2021-05-24 NOTE — ED Provider Notes (Signed)
[facility] EMERGENCY DEPARTMENT Provider Note   CSN: [id number] Arrival date & time: 05/24/21  1818     History  Chief complaint: Tracheostomy complication  Stacy Moran is a 66 y.o. female.  HPI  Patient has history of stroke, hypertension, seizures, subdural hematoma and ruptured aneurysm.  She has a chronic tracheostomy and resides at a nursing facility.  Patient was noted to have difficulty at the nursing facility today.  They checked her sats and it was 88% and patient is normally not on oxygen.  Patient appeared to have some congestion in her tracheostomy.  They attempted suctioning and was sent to the ED for further evaluation.  Patient still feels she has some mucus in her trach  Home Medications Prior to Admission medications   Medication Sig Start Date End Date Taking? Authorizing Provider  acetaminophen (TYLENOL) 325 MG tablet Place 2 tablets (650 mg total) into feeding tube every 6 (six) hours as needed for mild pain. 05/20/20   Angiulli, [name], PA-C  ALPRAZolam Duanne Moron) 0.5 MG tablet Take 0.5 mg by mouth 3 (three) times daily as needed. 04/23/21   [provider]  amLODipine (NORVASC) 5 MG tablet Place 1 tablet (5 mg total) into feeding tube daily. 02/19/21   Nolberto Hanlon, MD  chlorhexidine (PERIDEX) 0.12 % solution 15 mLs by Mouth Rinse route 2 (two) times daily. 10/07/20   [name], DO  docusate (COLACE) 50 MG/5ML liquid Place 10 mLs (100 mg total) into feeding tube 2 (two) times daily as needed for mild constipation. 02/18/21   Nolberto Hanlon, MD  DULoxetine (CYMBALTA) 20 MG capsule Take 20 mg by mouth daily. 05/20/21   [provider]  fluticasone (FLONASE) 50 MCG/ACT nasal spray Place 1 spray into both nostrils 2 (two) times daily. 02/18/21   Nolberto Hanlon, MD  glycopyrrolate (ROBINUL) 1 MG tablet Take 1 tablet (1 mg total) by mouth 2 (two) times daily. 02/18/21   Nolberto Hanlon, MD  guaiFENesin (ROBITUSSIN) 100 MG/5ML liquid Place 10 mLs into feeding  tube every 8 (eight) hours. 02/18/21   Nolberto Hanlon, MD  ipratropium-albuterol (DUONEB) 0.5-2.5 (3) MG/3ML SOLN Take 3 mLs by nebulization every 4 (four) hours as needed. 12/21/20   Bonnielee Haff, MD  levETIRAcetam (KEPPRA) 100 MG/ML solution Place 7.5 mLs (750 mg total) into feeding tube 2 (two) times daily. 10/07/20   [name], DO  levothyroxine (SYNTHROID) 100 MCG tablet Place 1 tablet (100 mcg total) into feeding tube daily at 6 (six) AM. 07/16/20   [name], MD  Nutritional Supplements (FEEDING SUPPLEMENT, OSMOLITE 1.2 CAL,) LIQD Place 355 mLs into feeding tube 5 (five) times daily. 02/18/21   Nolberto Hanlon, MD  polyethylene glycol (MIRALAX / GLYCOLAX) 17 g packet Place 17 g into feeding tube 2 (two) times daily. 10/07/20   [name], DO  QUEtiapine (SEROQUEL) 50 MG tablet Place 1 tablet (50 mg total) into feeding tube 2 (two) times daily. 12/21/20 01/20/21  Bonnielee Haff, MD  senna (SENOKOT) 8.6 MG TABS tablet Place 1 tablet (8.6 mg total) into feeding tube daily. 10/08/20   Welborn, Ryan, DO  TRANSDERM-SCOP 1 MG/3DAYS 1 patch every 3 (three) days. 05/10/21   [provider]  Water For Irrigation, Sterile (FREE WATER) SOLN Place 150 mLs into feeding tube every 6 (six) hours. 02/18/21   Nolberto Hanlon, MD  hydrALAZINE (APRESOLINE) 50 MG tablet Place 1 tablet (50 mg total) into feeding tube every 8 (eight) hours. 08/09/20 10/07/20  [name], MD  Allergies    Patient has no known allergies.    Review of Systems   Review of Systems  Constitutional:  Negative for fever.  Cardiovascular:  Negative for chest pain.   Physical Exam Updated Vital Signs BP (!) 159/111    Pulse 80    Temp 98.8 F (37.1 C) (Oral)    Resp 18    SpO2 96%  Physical Exam Vitals and nursing note reviewed.  Constitutional:      General: She is not in acute distress.    Appearance: She is well-developed. She is not ill-appearing or diaphoretic.  HENT:     Head: Normocephalic and  atraumatic.     Right Ear: External ear normal.     Left Ear: External ear normal.  Eyes:     General: No scleral icterus.       Right eye: No discharge.        Left eye: No discharge.     Conjunctiva/sclera: Conjunctivae normal.  Neck:     Trachea: No tracheal deviation.     Comments: Tracheostomy in place, no surrounding erythema or edema, no drainage, signs of airway congestion on inspiration, patient still able to speak Cardiovascular:     Rate and Rhythm: Normal rate.  Pulmonary:     Effort: Pulmonary effort is normal. No respiratory distress.     Breath sounds: No stridor.  Abdominal:     General: There is no distension.  Musculoskeletal:        General: No swelling or deformity.     Cervical back: Neck supple.  Skin:    General: Skin is warm and dry.     Findings: No rash.  Neurological:     Mental Status: She is alert.     Cranial Nerves: Cranial nerve deficit: no gross deficits.    ED Results / Procedures / Treatments   Labs (all labs ordered are listed, but only abnormal results are displayed) Labs Reviewed  CBC - Abnormal; Notable for the following components:      Result Value   Platelets 129 (*)    All other components within normal limits  BASIC METABOLIC PANEL - Abnormal; Notable for the following components:   Glucose, Bld 110 (*)    BUN 24 (*)    All other components within normal limits    EKG None  Radiology DG Chest Portable 1 View  Result Date: 05/24/2021 CLINICAL DATA:  Congestion. EXAM: PORTABLE CHEST 1 VIEW COMPARISON:  Chest radiograph dated 01/19/2021. FINDINGS: Tracheostomy above the carina. No focal consolidation, pleural effusion, pneumothorax the cardiac silhouette is within limits. Atherosclerotic calcification of the aorta. No acute osseous pathology. Partially visualized VP shunt over the right chest. The visualized portion of the shunt appears intact. IMPRESSION: No active cardiopulmonary disease. Electronically Signed   By: Anner Crete M.D.   On: 05/24/2021 19:37    Procedures Procedures    Medications Ordered in ED Medications  ipratropium-albuterol (DUONEB) 0.5-2.5 (3) MG/3ML nebulizer solution 3 mL (3 mLs Nebulization Given 05/24/21 2013)    ED Course/ Medical Decision Making/ A&P Clinical Course as of 05/24/21 2153  [facility] May 24, 2021  1740 Basic metabolic panel(!) [JK]  8144 CBC(!) normal [JK]  2152 DG Chest Portable 1 View chest x-ray images and radiology report reviewed.  No acute findings [JK]    Clinical Course User Index [JK] Dorie Rank, MD  Medical Decision Making Amount and/or Complexity of Data Reviewed Labs: ordered. Radiology: ordered.  Risk Prescription drug management.   Patient presented to the ED for evaluation of decreased oxygen saturation and mucous plugging of her tracheostomy.  Patient was suctioned in the ED.  She was given a breathing treatment.  Her symptoms have improved significantly and she feels like she is breathing normally now.  No signs of acute infection.  No pneumonia noted.  Evaluation and diagnostic testing in the emergency department does not suggest an emergent condition requiring admission or immediate intervention beyond what has been performed at this time.  The patient is safe for discharge and has been instructed to return immediately for worsening symptoms, change in symptoms or any other concerns.         Final Clinical Impression(s) / ED Diagnoses Final diagnoses:  Tracheostomy complication, unspecified complication type [facility])    Rx / DC Orders ED Discharge Orders     None         Dorie Rank, MD 05/24/21 2153

## 2021-05-24 NOTE — Discharge Instructions (Signed)
Continue your current medications.  Return to the ED as needed for recurrent symptoms

## 2021-05-25 MED ORDER — ALPRAZOLAM 0.5 MG PO TABS
0.5000 mg | ORAL_TABLET | Freq: Three times a day (TID) | ORAL | Status: DC | PRN
  Administered 2021-05-25: 0.5 mg via ORAL
  Filled 2021-05-25: qty 1

## 2021-05-25 MED ORDER — OSMOLITE 1.2 CAL PO LIQD
355.0000 mL | Freq: Every day | ORAL | Status: DC
  Administered 2021-05-25: 355 mL
  Filled 2021-05-25 (×3): qty 474

## 2021-05-25 NOTE — ED Notes (Signed)
Pt asking for something for anxiety, EDP made aware

## 2021-05-25 NOTE — ED Notes (Signed)
Pt's daughter,Sasha, called about update pertaining to her mother. Daughter notified on discharge plan back to [place]

## 2021-06-01 ENCOUNTER — Inpatient Hospital Stay (HOSPITAL_COMMUNITY)
Admission: EM | Admit: 2021-06-01 | Discharge: 2021-06-02 | DRG: 297 | Disposition: A | Discharge status: Skilled Nursing Facility | Payer: Medicaid Other | Source: Skilled Nursing Facility | Attending: Family Medicine | Admitting: Family Medicine

## 2021-06-01 ENCOUNTER — Emergency Department (HOSPITAL_COMMUNITY): Payer: Medicaid Other

## 2021-06-01 ENCOUNTER — Encounter (HOSPITAL_COMMUNITY): Payer: Self-pay

## 2021-06-01 ENCOUNTER — Other Ambulatory Visit: Payer: Self-pay

## 2021-06-01 DIAGNOSIS — W06XXXA Fall from bed, initial encounter: Secondary | ICD-10-CM | POA: Diagnosis present

## 2021-06-01 DIAGNOSIS — Z9889 Other specified postprocedural states: Secondary | ICD-10-CM | POA: Diagnosis present

## 2021-06-01 DIAGNOSIS — N6321 Unspecified lump in the left breast, upper outer quadrant: Secondary | ICD-10-CM | POA: Diagnosis present

## 2021-06-01 DIAGNOSIS — R569 Unspecified convulsions: Secondary | ICD-10-CM

## 2021-06-01 DIAGNOSIS — Z79890 Hormone replacement therapy (postmenopausal): Secondary | ICD-10-CM

## 2021-06-01 DIAGNOSIS — R9389 Abnormal findings on diagnostic imaging of other specified body structures: Secondary | ICD-10-CM | POA: Diagnosis present

## 2021-06-01 DIAGNOSIS — R1310 Dysphagia, unspecified: Secondary | ICD-10-CM | POA: Diagnosis present

## 2021-06-01 DIAGNOSIS — J3802 Paralysis of vocal cords and larynx, bilateral: Secondary | ICD-10-CM | POA: Diagnosis present

## 2021-06-01 DIAGNOSIS — Z970 Presence of artificial eye: Secondary | ICD-10-CM | POA: Diagnosis not present

## 2021-06-01 DIAGNOSIS — Z79899 Other long term (current) drug therapy: Secondary | ICD-10-CM

## 2021-06-01 DIAGNOSIS — I469 Cardiac arrest, cause unspecified: Principal | ICD-10-CM | POA: Diagnosis present

## 2021-06-01 DIAGNOSIS — Z20822 Contact with and (suspected) exposure to covid-19: Secondary | ICD-10-CM | POA: Diagnosis present

## 2021-06-01 DIAGNOSIS — E876 Hypokalemia: Secondary | ICD-10-CM | POA: Diagnosis present

## 2021-06-01 DIAGNOSIS — Z803 Family history of malignant neoplasm of breast: Secondary | ICD-10-CM | POA: Diagnosis not present

## 2021-06-01 DIAGNOSIS — R7401 Elevation of levels of liver transaminase levels: Secondary | ICD-10-CM | POA: Diagnosis present

## 2021-06-01 DIAGNOSIS — Z930 Tracheostomy status: Secondary | ICD-10-CM | POA: Diagnosis not present

## 2021-06-01 DIAGNOSIS — I10 Essential (primary) hypertension: Secondary | ICD-10-CM | POA: Diagnosis present

## 2021-06-01 DIAGNOSIS — E890 Postprocedural hypothyroidism: Secondary | ICD-10-CM | POA: Diagnosis present

## 2021-06-01 DIAGNOSIS — R590 Localized enlarged lymph nodes: Secondary | ICD-10-CM | POA: Diagnosis present

## 2021-06-01 DIAGNOSIS — G919 Hydrocephalus, unspecified: Secondary | ICD-10-CM | POA: Diagnosis present

## 2021-06-01 DIAGNOSIS — Z298 Encounter for other specified prophylactic measures: Secondary | ICD-10-CM

## 2021-06-01 DIAGNOSIS — R4182 Altered mental status, unspecified: Secondary | ICD-10-CM | POA: Diagnosis present

## 2021-06-01 DIAGNOSIS — Z2989 Encounter for other specified prophylactic measures: Secondary | ICD-10-CM

## 2021-06-01 DIAGNOSIS — Z982 Presence of cerebrospinal fluid drainage device: Secondary | ICD-10-CM

## 2021-06-01 DIAGNOSIS — J439 Emphysema, unspecified: Secondary | ICD-10-CM | POA: Diagnosis present

## 2021-06-01 DIAGNOSIS — I69891 Dysphagia following other cerebrovascular disease: Secondary | ICD-10-CM

## 2021-06-01 DIAGNOSIS — I69291 Dysphagia following other nontraumatic intracranial hemorrhage: Secondary | ICD-10-CM

## 2021-06-01 DIAGNOSIS — Z87891 Personal history of nicotine dependence: Secondary | ICD-10-CM | POA: Diagnosis not present

## 2021-06-01 DIAGNOSIS — Y92129 Unspecified place in nursing home as the place of occurrence of the external cause: Secondary | ICD-10-CM | POA: Diagnosis not present

## 2021-06-01 DIAGNOSIS — Z931 Gastrostomy status: Secondary | ICD-10-CM

## 2021-06-01 DIAGNOSIS — I607 Nontraumatic subarachnoid hemorrhage from unspecified intracranial artery: Secondary | ICD-10-CM | POA: Diagnosis present

## 2021-06-01 DIAGNOSIS — I69391 Dysphagia following cerebral infarction: Secondary | ICD-10-CM

## 2021-06-01 LAB — COMPREHENSIVE METABOLIC PANEL
ALT: 54 U/L — ABNORMAL HIGH (ref 0–44)
AST: 58 U/L — ABNORMAL HIGH (ref 15–41)
Albumin: 3.8 g/dL (ref 3.5–5.0)
Alkaline Phosphatase: 71 U/L (ref 38–126)
Anion gap: 11 (ref 5–15)
BUN: 23 mg/dL (ref 8–23)
CO2: 27 mmol/L (ref 22–32)
Calcium: 9.4 mg/dL (ref 8.9–10.3)
Chloride: 100 mmol/L (ref 98–111)
Creatinine, Ser: 0.76 mg/dL (ref 0.44–1.00)
GFR, Estimated: >60 mL/min (ref >60)
Glucose, Bld: 223 mg/dL — ABNORMAL HIGH (ref 70–99)
Potassium: 3.4 mmol/L — ABNORMAL LOW (ref 3.5–5.1)
Sodium: 138 mmol/L (ref 135–145)
Total Bilirubin: 0.2 mg/dL — ABNORMAL LOW (ref 0.3–1.2)
Total Protein: 7.9 g/dL (ref 6.5–8.1)

## 2021-06-01 LAB — BRAIN NATRIURETIC PEPTIDE: B Natriuretic Peptide: 16 pg/mL (ref 0.0–100.0)

## 2021-06-01 LAB — CBC WITH DIFFERENTIAL/PLATELET
Abs Immature Granulocytes: 0.02 10*3/uL (ref 0.00–0.07)
Basophils Absolute: 0 10*3/uL (ref 0.0–0.1)
Basophils Relative: 0 %
Eosinophils Absolute: 0.1 10*3/uL (ref 0.0–0.5)
Eosinophils Relative: 1 %
HCT: 41.6 % (ref 36.0–46.0)
Hemoglobin: 12.8 g/dL (ref 12.0–15.0)
Immature Granulocytes: 0 %
Lymphocytes Relative: 58 %
Lymphs Abs: 3.6 10*3/uL (ref 0.7–4.0)
MCH: 26.2 pg (ref 26.0–34.0)
MCHC: 30.8 g/dL (ref 30.0–36.0)
MCV: 85.2 fL (ref 80.0–100.0)
Monocytes Absolute: 0.5 10*3/uL (ref 0.1–1.0)
Monocytes Relative: 8 %
Neutro Abs: 2.1 10*3/uL (ref 1.7–7.7)
Neutrophils Relative %: 33 %
Platelets: 122 10*3/uL — ABNORMAL LOW (ref 150–400)
RBC: 4.88 MIL/uL (ref 3.87–5.11)
RDW: 13.9 % (ref 11.5–15.5)
WBC: 6.4 10*3/uL (ref 4.0–10.5)
nRBC: 0 % (ref 0.0–0.2)

## 2021-06-01 LAB — RESP PANEL BY RT-PCR (FLU A&B, COVID) ARPGX2
Influenza A by PCR: NEGATIVE
Influenza B by PCR: NEGATIVE
SARS Coronavirus 2 by RT PCR: NEGATIVE

## 2021-06-01 LAB — TROPONIN I (HIGH SENSITIVITY)
Troponin I (High Sensitivity): 4 ng/L (ref <18)
Troponin I (High Sensitivity): 11 ng/L (ref <18)

## 2021-06-01 LAB — D-DIMER, QUANTITATIVE: D-Dimer, Quant: 1.35 ug/mL-FEU — ABNORMAL HIGH (ref 0.00–0.50)

## 2021-06-01 LAB — MAGNESIUM: Magnesium: 2.2 mg/dL (ref 1.7–2.4)

## 2021-06-01 IMAGING — CT CT ANGIO CHEST
2 of 7 series · 17 of 46 positions shown · IV contrast (Omnipaque or Isovue)
Comparison: Chest x-ray [DATE]

CLINICAL DATA: Elevated D-dimer.  Possible pulmonary embolism.

EXAM:
CT ANGIOGRAPHY CHEST WITH CONTRAST
TECHNIQUE: Multidetector CT imaging of the chest was performed using the
standard protocol during bolus administration of intravenous
contrast. Multiplanar CT image reconstructions and MIPs were
obtained to evaluate the vascular anatomy.

[Series 5: pe axial thins · axial · 0.75mm/px · z∈[+1202,+1473]mm · 14 of 371 slices shown]
[im 21/371  lung]
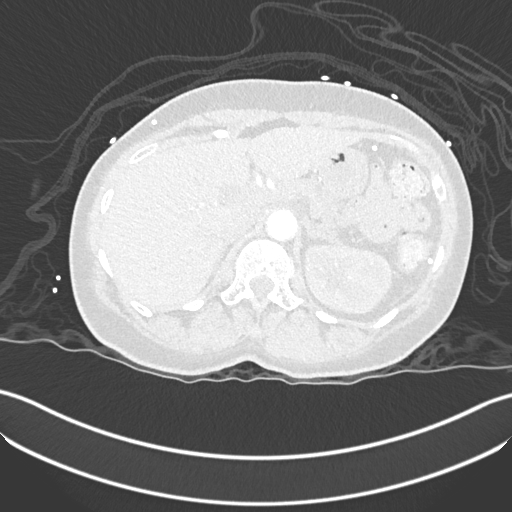
[im 42/371  soft-tissue]
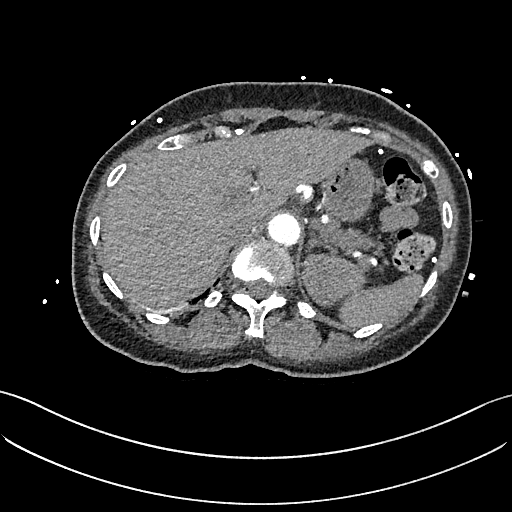
[im 83/371  lung]
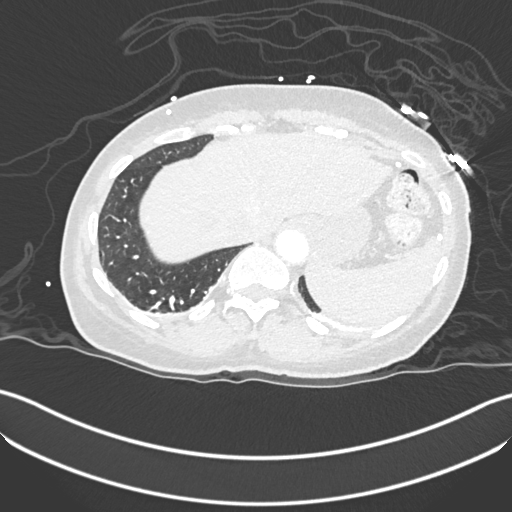
[im 103/371  soft-tissue]
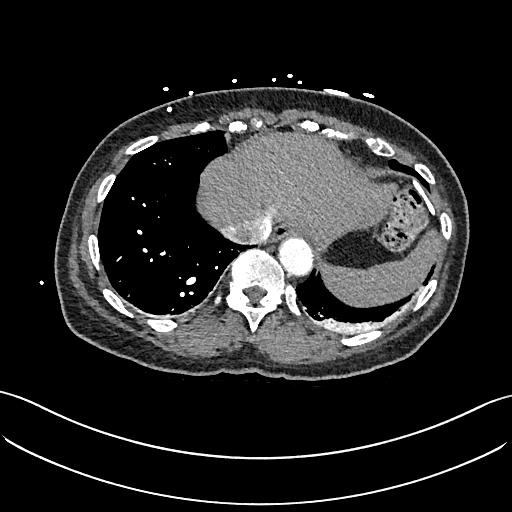
[im 124/371  lung]
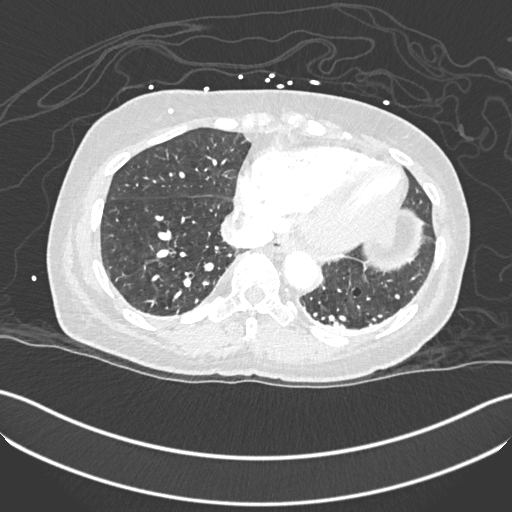
[im 144/371  soft-tissue]
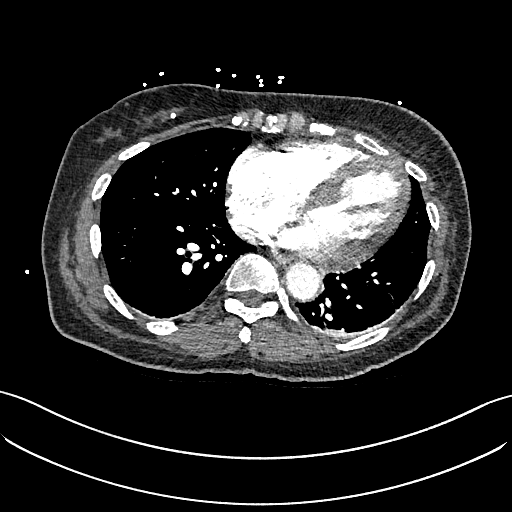
[im 165/371  lung]
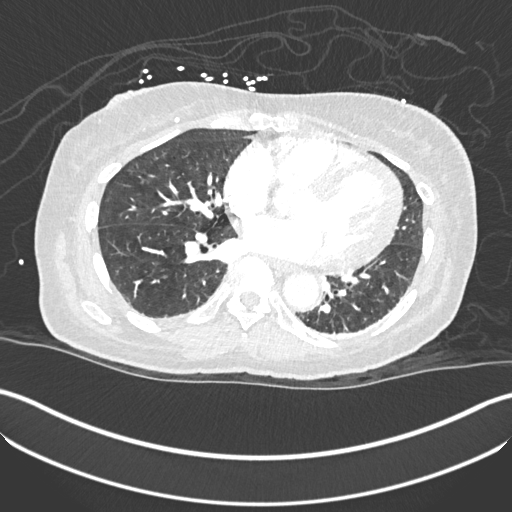
[im 206/371  soft-tissue]
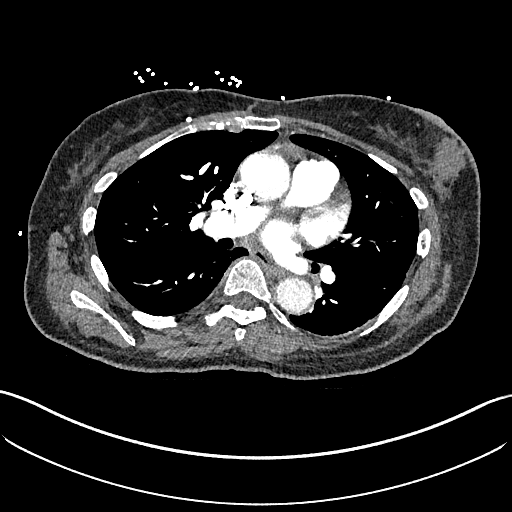
[im 227/371  lung]
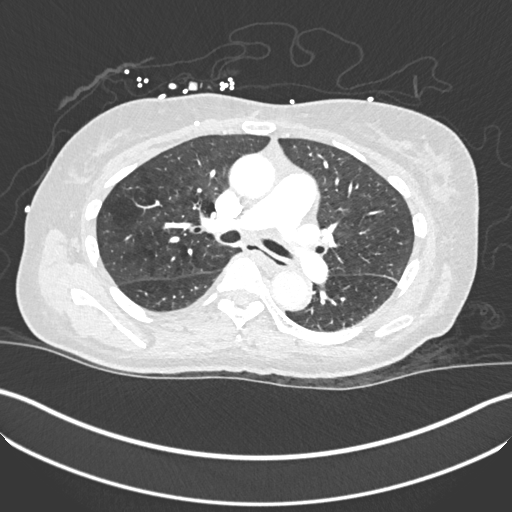
[im 247/371  soft-tissue]
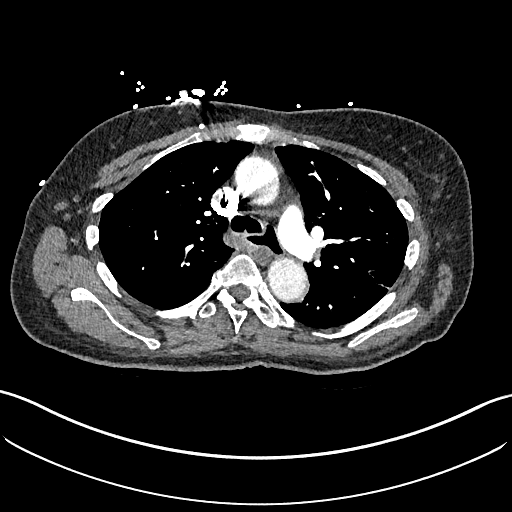
[im 268/371  lung]
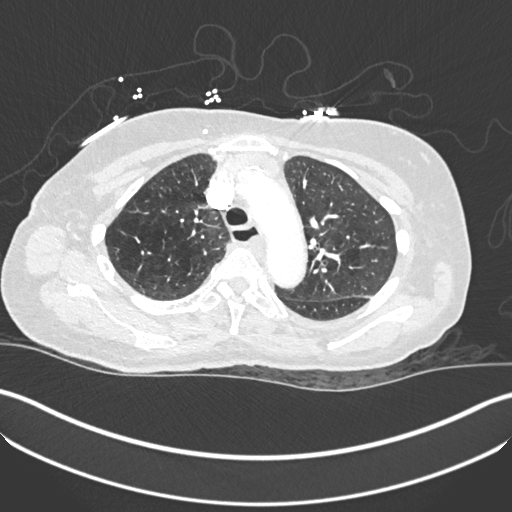
[im 288/371  soft-tissue]
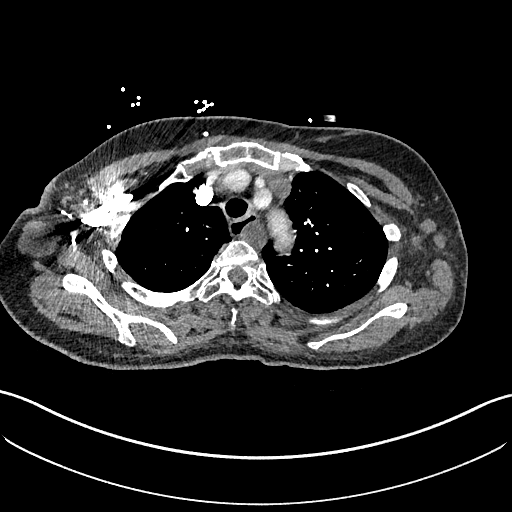
[im 329/371  lung]
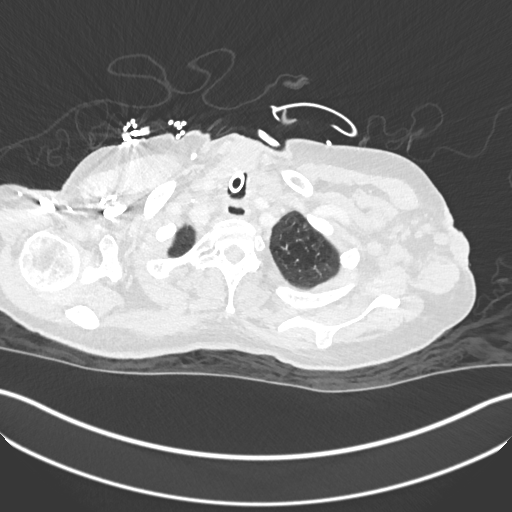
[im 350/371  soft-tissue]
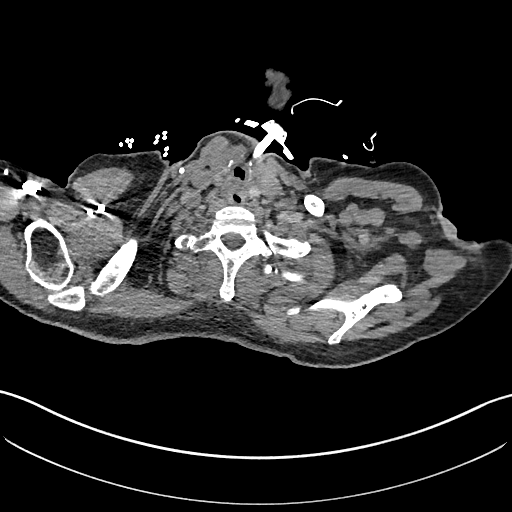

[Series 7: cor soft · coronal · 0.65mm/px · 3 of 127 slices shown]
[im 32/127  soft-tissue]
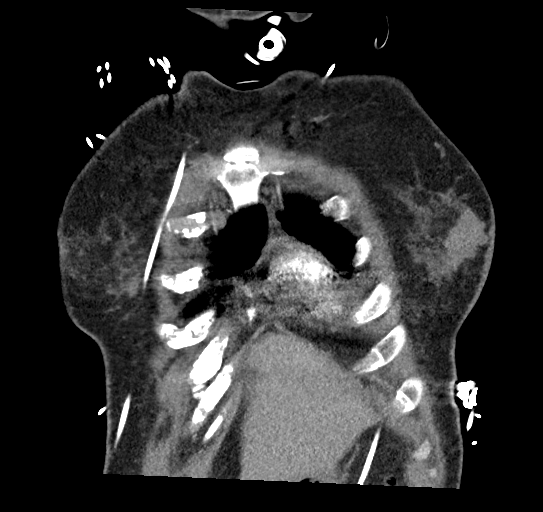
[im 64/127  soft-tissue]
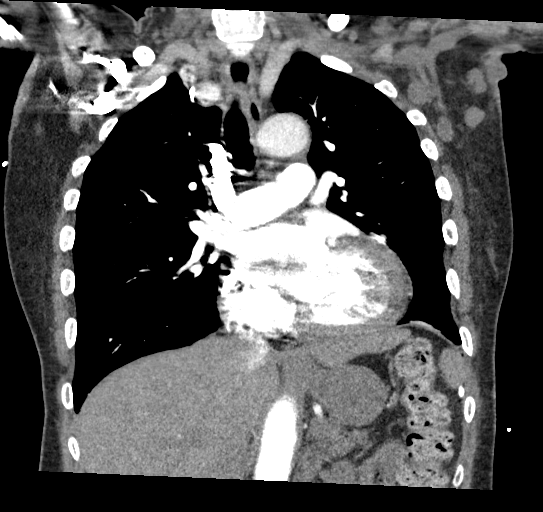
[im 95/127  soft-tissue]
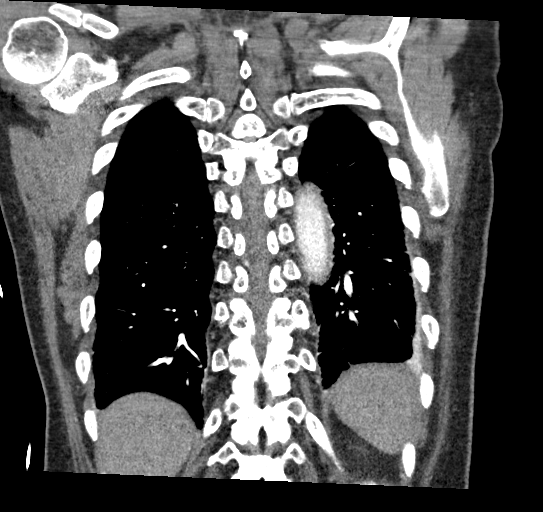

[17 of 46 positions shown; findings below may reference images not displayed]

RADIATION DOSE REDUCTION: This exam was performed according to the
departmental dose-optimization program which includes automated
exposure control, adjustment of the mA and/or kV according to
patient size and/or use of iterative reconstruction technique.

CONTRAST:  75mL OMNIPAQUE IOHEXOL 350 MG/ML SOLN
FINDINGS: Cardiovascular: Heart is normal size. Bovine arch morphology.
Thoracic aorta is normal in caliber without evidence of aneurysm.
Subtle calcified plaque over the left anterior descending coronary
artery. Pulmonary arterial system is well opacified and demonstrates
no evidence of emboli. Remaining vasculature is unremarkable.

Mediastinum/Nodes: Tracheostomy tube in adequate position. No
evidence of mediastinal or hilar adenopathy. Well-defined oval
cystic mass over the middle mediastinum abutting the left
posterolateral aspect of the esophagus measuring approximately 1.8 x
1.8 x 6 cm in AP, transverse and craniocaudal dimensions. This may
represent a forgut duplication cyst such as bronchogenic cyst or
enteric duplication cyst. Remaining mediastinal structures are
unremarkable.

Lungs/Pleura: Lungs are adequately inflated with mild emphysematous
disease over the mid to upper lungs. Minimal posterior dependent
atelectasis left worse than right. No acute airspace process or
effusion. Airways are normal.

Upper Abdomen: 1.7 cm hypodensity over the upper central aspect of
the right lobe of the liver. No acute findings.

Musculoskeletal: There is a 3.3 cm mass over the outer midportion of
the left breast with associated skin thickening. There is left
axillary and retropectoral adenopathy. No focal bony abnormality.

Review of the MIP images confirms the above findings.
IMPRESSION: 1. No evidence of acute pulmonary embolism. No acute cardiopulmonary
disease.
2. 3.3 cm mass over the outer midportion of the left breast with
associated skin thickening and associated left axillary and
retropectoral adenopathy. Findings are concerning for primary breast
carcinoma. Recommend further evaluation with diagnostic mammography
and ultrasound.
3. 6 cm well-defined cystic mass over the middle mediastinum
abutting the left posterolateral aspect of the esophagus. This
likely represents a forgut duplication cyst.
4. Emphysema. Minimal atherosclerotic calcification over the left
anterior descending coronary artery.
5. 1.7 cm hypodensity over the upper central aspect of the right
lobe of the liver. In light of the above findings, this may
represent a metastatic focus. Recommend CT abdomen/pelvis with
intravenous contrast.

Emphysema ([QG]-[QG]).

These results were called by telephone at the time of interpretation
on [DATE] at [DATE] to provider FILS-AIME , who verbally
acknowledged these results.

## 2021-06-01 IMAGING — DX DG CHEST 1V PORT
1 series · 1 of 1 positions shown · non-contrast
Comparison: [DATE]

CLINICAL DATA: Altered mental status

EXAM:
PORTABLE CHEST 1 VIEW

[chest ap]
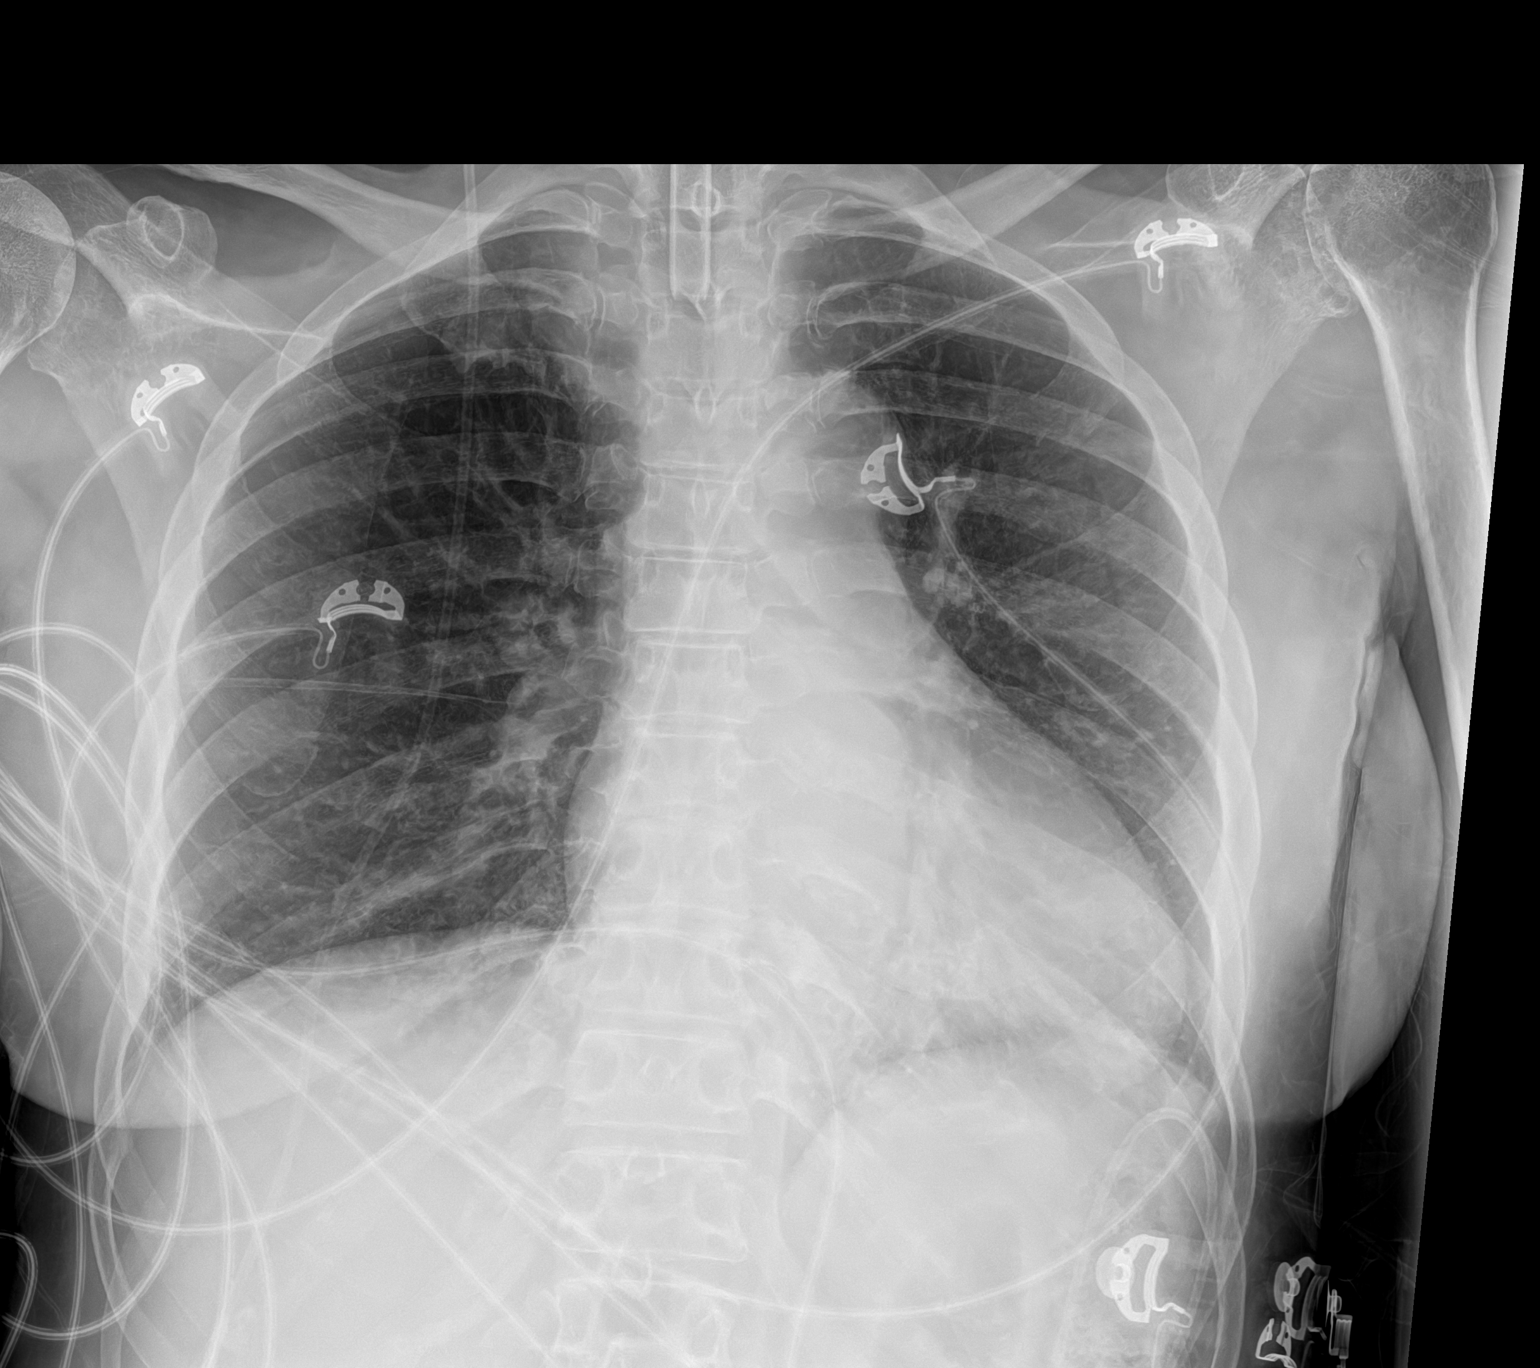

[1 of 1 positions shown; findings below may reference images not displayed]

FINDINGS: Transverse diameter of heart is increased. Tip of tracheostomy tube
is 6.9 cm above the carina. Ventriculoperitoneal shunt is seen on
the right side. There are no signs of alveolar pulmonary edema or
focal pulmonary consolidation. There are linear densities in the
medial left lower lung fields. There is no pleural effusion or
pneumothorax.
IMPRESSION: Linear densities in the medial left lower lung fields suggest
subsegmental atelectasis. There are no signs of pulmonary edema or
focal pulmonary consolidation.

## 2021-06-01 IMAGING — CT CT HEAD W/O CM
3 of 4 series · 15 of 47 positions shown, 18 images · non-contrast
Comparison: Head CT [DATE].

CLINICAL DATA: 65-year-old female with history of altered mental
status. History of seizures.



[Series 2: head w o · axial · 0.44mm/px · z∈[-107,+18]mm · 9 of 31 slices shown, 12 images]
[im 3/31  brain]
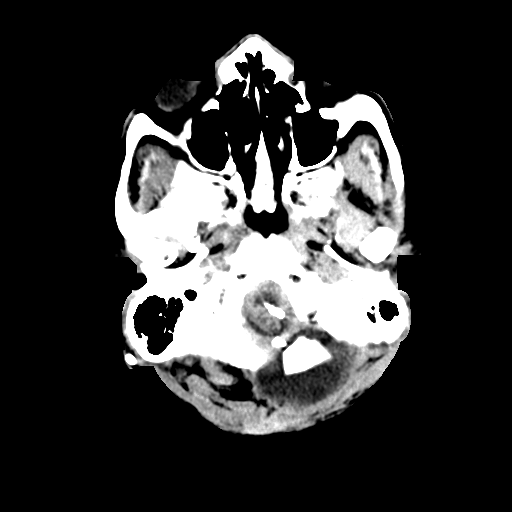
[im 3/31  bone]
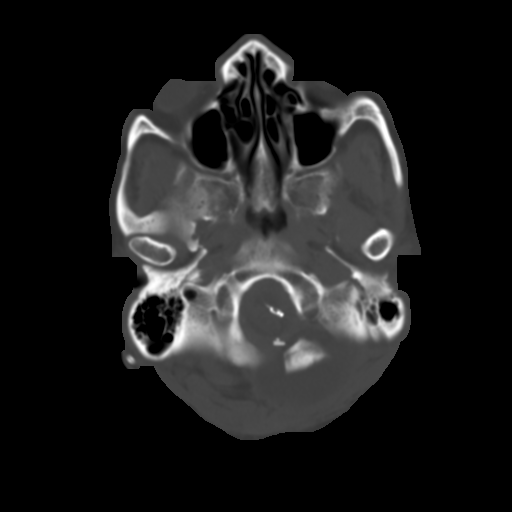
[im 7/31  brain]
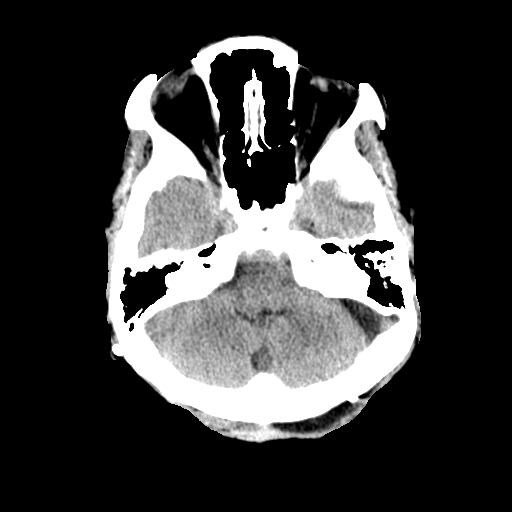
[im 9/31  brain]
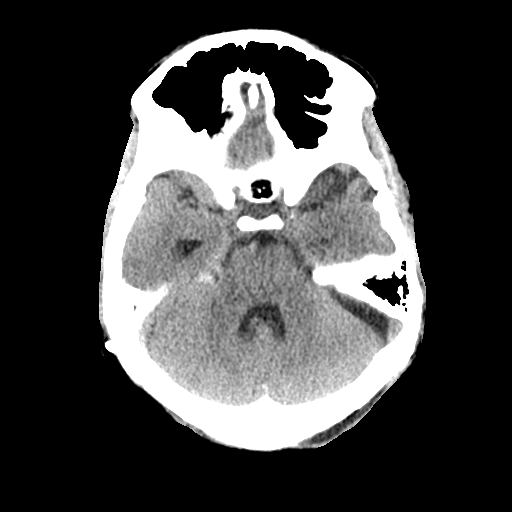
[im 13/31  brain]
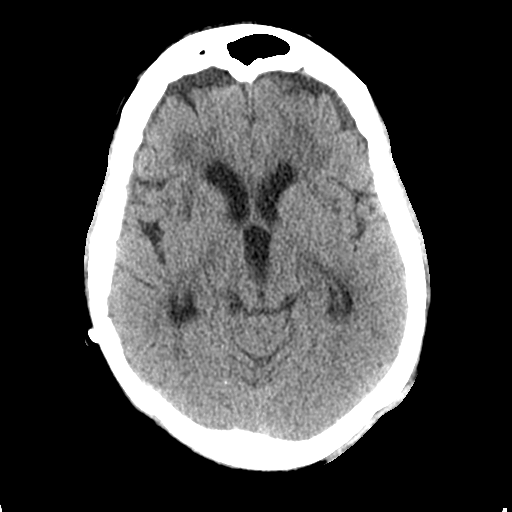
[im 16/31  brain]
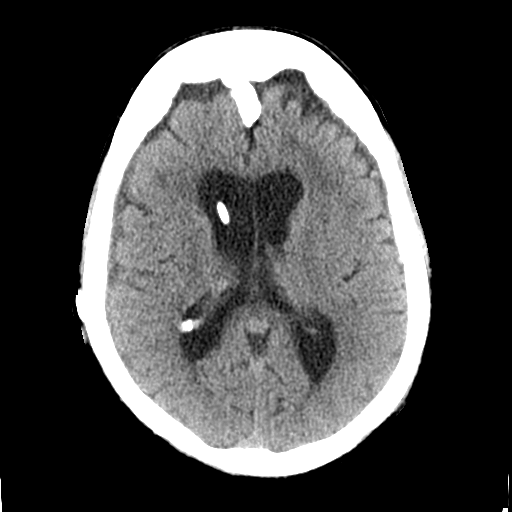
[im 16/31  bone]
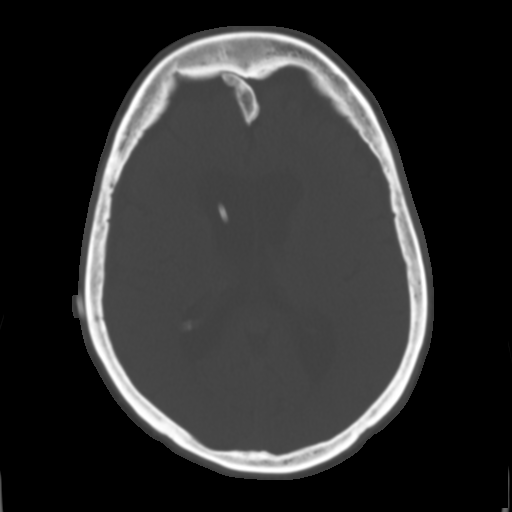
[im 18/31  brain]
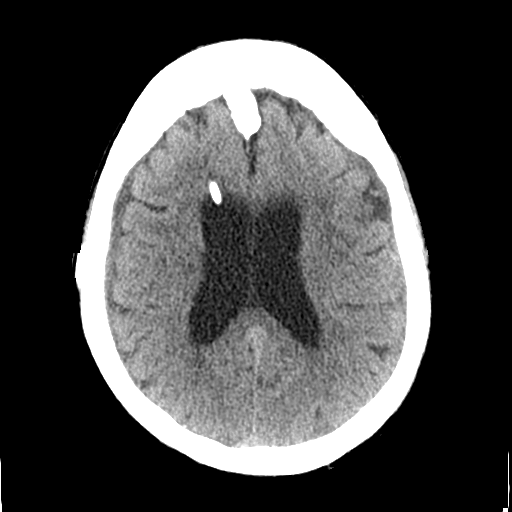
[im 22/31  brain]
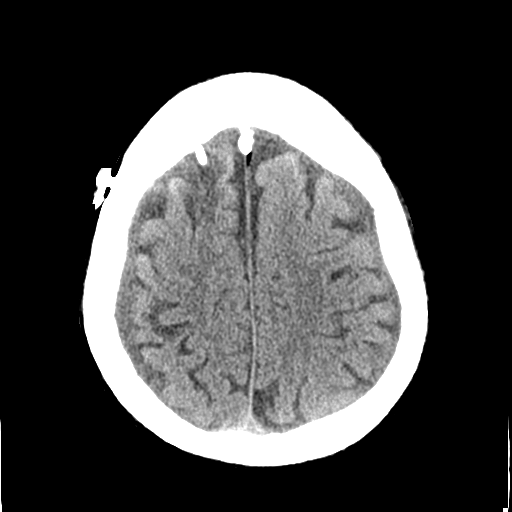
[im 24/31  brain]
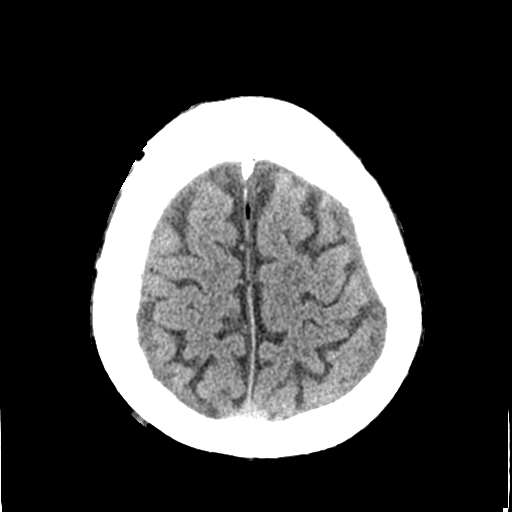
[im 28/31  brain]
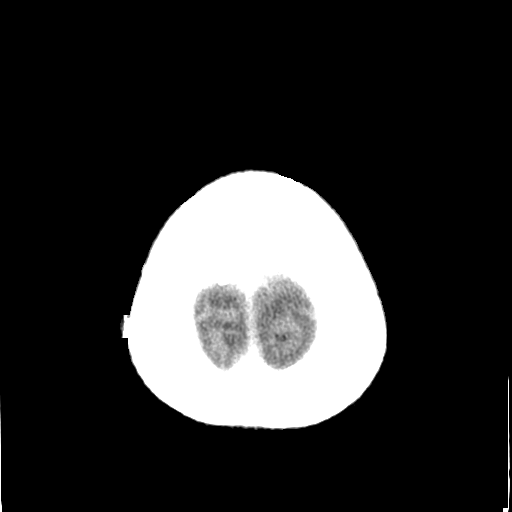
[im 28/31  bone]
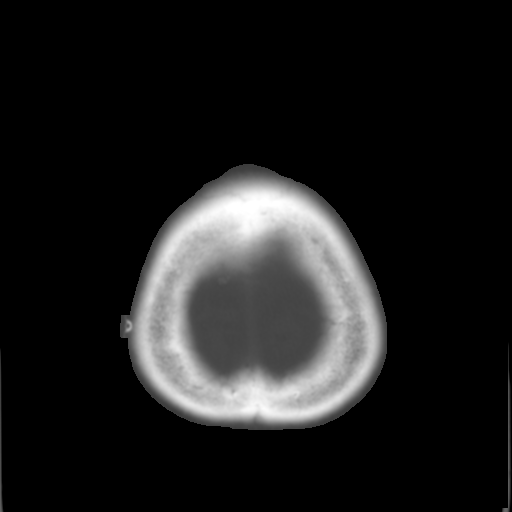

[Series 4: coronal soft · coronal · 0.35mm/px · 3 of 73 slices shown]
[im 25/73  brain]
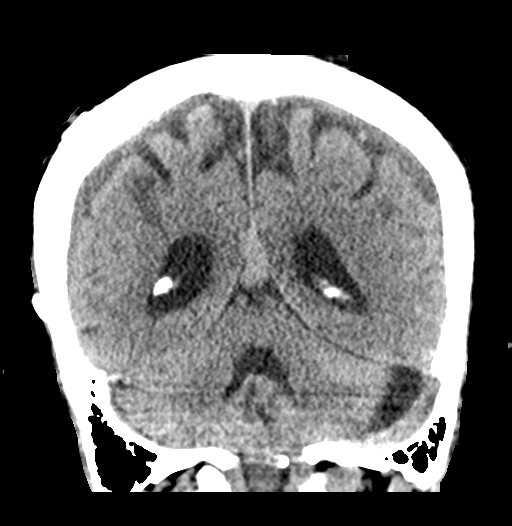
[im 33/73  brain]
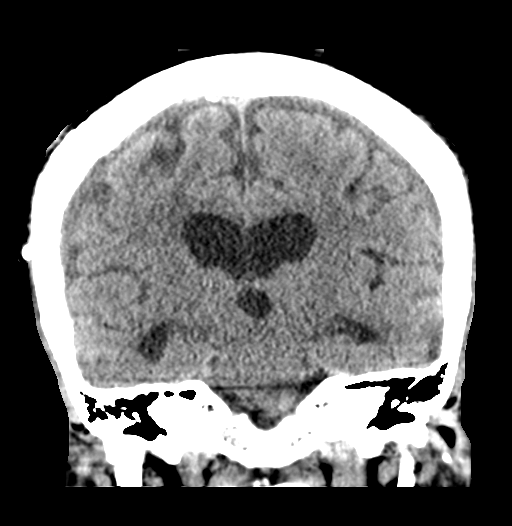
[im 41/73  brain]
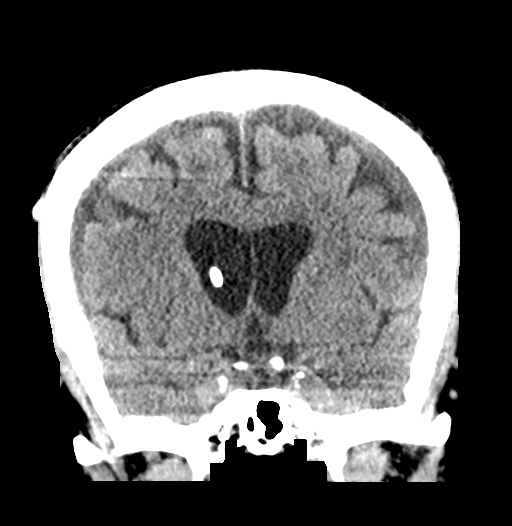

[Series 5: sagittal soft · sagittal · 0.35mm/px · 3 of 67 slices shown]
[im 23/67  brain]
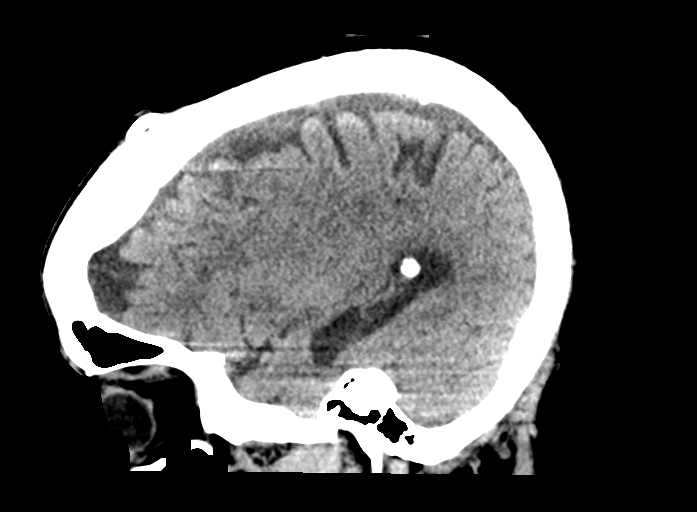
[im 34/67  brain]
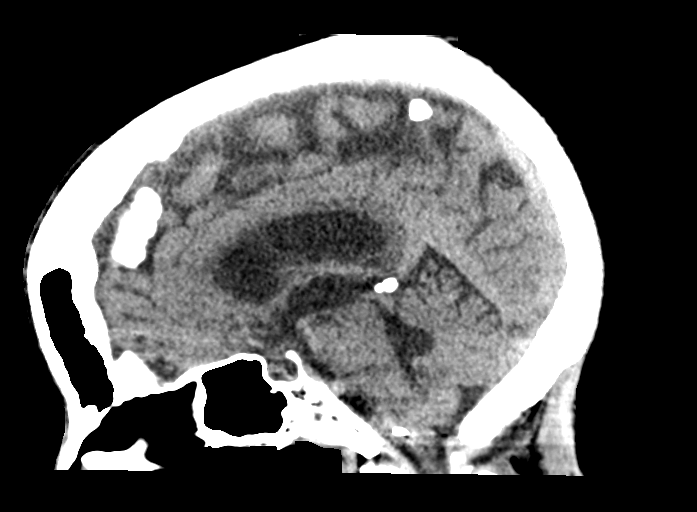
[im 45/67  brain]
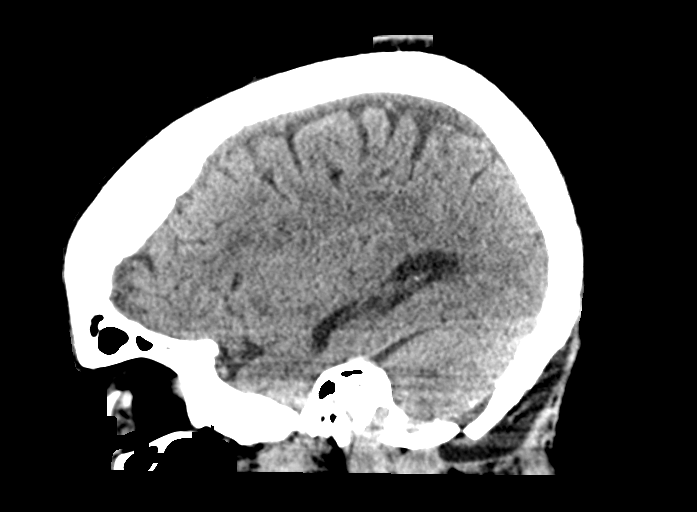

[15 of 47 positions shown; findings below may reference images not displayed]

FINDINGS: Brain: Previously noted chronic extra-axial fluid collections have
decreased in size, compatible with resolving bilateral chronic
subdural hematomas/hygromas. Right frontal ventriculostomy shunt
catheter with tip terminating in the anterior aspect of the right
lateral ventricle. Ventricles appear slightly dilated compared to
the prior examination, suggesting potential shunt dysfunction.
Patchy and confluent areas of decreased attenuation are noted
throughout the deep and periventricular white matter of the cerebral
hemispheres bilaterally, compatible with chronic microvascular
ischemic disease. No evidence of acute infarction, hemorrhage, or
mass lesion/mass effect.

Vascular: Atherosclerotic calcifications in the cerebral
vasculature.

Skull: Status post left suboccipital craniotomy.

Sinuses/Orbits: No acute finding.

Other: None.
IMPRESSION: 1. Interval development of mild hydrocephalus. Clinical correlation
for evidence of ventriculostomy shunt catheter is function is
recommended.
2. Decreasing size of small chronic bilateral subdural
hematomas/hygromas.
3. Mild chronic microvascular ischemic changes in the cerebral white
matter, as above.

## 2021-06-01 MED ORDER — FREE WATER
150.0000 mL | Freq: Four times a day (QID) | Status: DC
  Administered 2021-06-01 – 2021-06-02 (×4): 150 mL

## 2021-06-01 MED ORDER — GLYCOPYRROLATE 1 MG PO TABS
1.0000 mg | ORAL_TABLET | Freq: Two times a day (BID) | ORAL | Status: DC
  Administered 2021-06-01 – 2021-06-02 (×2): 1 mg
  Filled 2021-06-01 (×6): qty 1

## 2021-06-01 MED ORDER — OXYCODONE HCL 5 MG PO TABS: 5.0000 mg | ORAL_TABLET | ORAL | Status: DC | PRN

## 2021-06-01 MED ORDER — LEVOTHYROXINE SODIUM 100 MCG PO TABS
100.0000 ug | ORAL_TABLET | Freq: Every day | ORAL | Status: DC
  Administered 2021-06-02: 100 ug
  Filled 2021-06-01: qty 1

## 2021-06-01 MED ORDER — SODIUM CHLORIDE 0.9 % IV BOLUS
1000.0000 mL | Freq: Once | INTRAVENOUS | Status: AC
  Administered 2021-06-01: 1000 mL via INTRAVENOUS

## 2021-06-01 MED ORDER — HYDROMORPHONE HCL 1 MG/ML IJ SOLN: 0.5000 mg | INTRAMUSCULAR | Status: DC | PRN

## 2021-06-01 MED ORDER — QUETIAPINE FUMARATE 25 MG PO TABS
50.0000 mg | ORAL_TABLET | Freq: Two times a day (BID) | ORAL | Status: DC
  Administered 2021-06-01 – 2021-06-02 (×2): 50 mg
  Filled 2021-06-01 (×2): qty 2

## 2021-06-01 MED ORDER — CHLORHEXIDINE GLUCONATE CLOTH 2 % EX PADS
6.0000 | MEDICATED_PAD | Freq: Every day | CUTANEOUS | Status: DC
  Administered 2021-06-01: 6 via TOPICAL

## 2021-06-01 MED ORDER — ACETAMINOPHEN 325 MG PO TABS: 650.0000 mg | ORAL_TABLET | Freq: Four times a day (QID) | ORAL | Status: DC | PRN

## 2021-06-01 MED ORDER — HYDRALAZINE HCL 20 MG/ML IJ SOLN: 10.0000 mg | INTRAMUSCULAR | Status: DC | PRN

## 2021-06-01 MED ORDER — POTASSIUM CHLORIDE 10 MEQ/100ML IV SOLN
10.0000 meq | INTRAVENOUS | Status: AC
  Administered 2021-06-01 (×2): 10 meq via INTRAVENOUS
  Filled 2021-06-01 (×2): qty 100

## 2021-06-01 MED ORDER — AMLODIPINE BESYLATE 5 MG PO TABS
5.0000 mg | ORAL_TABLET | Freq: Every day | ORAL | Status: DC
  Administered 2021-06-01 – 2021-06-02 (×2): 5 mg
  Filled 2021-06-01 (×2): qty 1

## 2021-06-01 MED ORDER — ALPRAZOLAM 0.5 MG PO TABS
0.5000 mg | ORAL_TABLET | Freq: Three times a day (TID) | ORAL | Status: DC | PRN
  Administered 2021-06-02: 0.5 mg via ORAL
  Filled 2021-06-01: qty 1

## 2021-06-01 MED ORDER — ONDANSETRON HCL 4 MG/2ML IJ SOLN: 4.0000 mg | Freq: Four times a day (QID) | INTRAMUSCULAR | Status: DC | PRN

## 2021-06-01 MED ORDER — TRAZODONE HCL 50 MG PO TABS: 25.0000 mg | ORAL_TABLET | Freq: Every evening | ORAL | Status: DC | PRN

## 2021-06-01 MED ORDER — SENNOSIDES-DOCUSATE SODIUM 8.6-50 MG PO TABS: 1.0000 | ORAL_TABLET | Freq: Every evening | ORAL | Status: DC | PRN

## 2021-06-01 MED ORDER — FLUTICASONE PROPIONATE 50 MCG/ACT NA SUSP
1.0000 | Freq: Two times a day (BID) | NASAL | Status: DC
  Administered 2021-06-01 – 2021-06-02 (×2): 1 via NASAL
  Filled 2021-06-01: qty 16

## 2021-06-01 MED ORDER — ONDANSETRON HCL 4 MG PO TABS: 4.0000 mg | ORAL_TABLET | Freq: Four times a day (QID) | ORAL | Status: DC | PRN

## 2021-06-01 MED ORDER — IPRATROPIUM BROMIDE 0.02 % IN SOLN
0.5000 mg | Freq: Four times a day (QID) | RESPIRATORY_TRACT | Status: DC | PRN
  Administered 2021-06-02: 0.5 mg via RESPIRATORY_TRACT
  Filled 2021-06-01: qty 2.5

## 2021-06-01 MED ORDER — LEVETIRACETAM IN NACL 1000 MG/100ML IV SOLN
1000.0000 mg | Freq: Once | INTRAVENOUS | Status: AC
  Administered 2021-06-01: 1000 mg via INTRAVENOUS
  Filled 2021-06-01: qty 100

## 2021-06-01 MED ORDER — SODIUM CHLORIDE 0.9% FLUSH: 3.0000 mL | Freq: Two times a day (BID) | INTRAVENOUS | Status: DC

## 2021-06-01 MED ORDER — IOHEXOL 350 MG/ML SOLN
75.0000 mL | Freq: Once | INTRAVENOUS | Status: AC | PRN
  Administered 2021-06-01: 75 mL via INTRAVENOUS

## 2021-06-01 MED ORDER — ACETAMINOPHEN 650 MG RE SUPP: 650.0000 mg | Freq: Four times a day (QID) | RECTAL | Status: DC | PRN

## 2021-06-01 MED ORDER — LEVALBUTEROL HCL 0.63 MG/3ML IN NEBU
0.6300 mg | INHALATION_SOLUTION | Freq: Four times a day (QID) | RESPIRATORY_TRACT | Status: DC | PRN
  Administered 2021-06-02 (×2): 0.63 mg via RESPIRATORY_TRACT
  Filled 2021-06-01 (×2): qty 3

## 2021-06-01 MED ORDER — DULOXETINE HCL 20 MG PO CPEP
20.0000 mg | ORAL_CAPSULE | Freq: Every day | ORAL | Status: DC
  Administered 2021-06-01 – 2021-06-02 (×2): 20 mg via ORAL
  Filled 2021-06-01 (×3): qty 1

## 2021-06-01 MED ORDER — BISACODYL 5 MG PO TBEC: 5.0000 mg | DELAYED_RELEASE_TABLET | Freq: Every day | ORAL | Status: DC | PRN

## 2021-06-01 MED ORDER — LEVETIRACETAM 100 MG/ML PO SOLN
750.0000 mg | Freq: Two times a day (BID) | ORAL | Status: DC
  Administered 2021-06-01 – 2021-06-02 (×2): 750 mg
  Filled 2021-06-01 (×4): qty 7.5

## 2021-06-01 MED ORDER — POLYETHYLENE GLYCOL 3350 17 G PO PACK
17.0000 g | PACK | Freq: Two times a day (BID) | ORAL | Status: DC
  Administered 2021-06-02: 17 g
  Filled 2021-06-01 (×2): qty 1

## 2021-06-01 MED ORDER — SODIUM CHLORIDE 0.9 % IV SOLN: 250.0000 mL | INTRAVENOUS | Status: DC | PRN

## 2021-06-01 MED ORDER — SENNA 8.6 MG PO TABS
1.0000 | ORAL_TABLET | Freq: Every day | ORAL | Status: DC
  Administered 2021-06-02: 8.6 mg
  Filled 2021-06-01 (×2): qty 1

## 2021-06-01 MED ORDER — OSMOLITE 1.2 CAL PO LIQD
355.0000 mL | Freq: Every day | ORAL | Status: DC
  Administered 2021-06-01 – 2021-06-02 (×3): 355 mL
  Filled 2021-06-01 (×10): qty 474

## 2021-06-01 MED ORDER — SODIUM CHLORIDE 0.9% FLUSH
3.0000 mL | Freq: Two times a day (BID) | INTRAVENOUS | Status: DC
  Administered 2021-06-01: 3 mL via INTRAVENOUS

## 2021-06-01 MED ORDER — HEPARIN SODIUM (PORCINE) 5000 UNIT/ML IJ SOLN: 5000.0000 [IU] | Freq: Three times a day (TID) | INTRAMUSCULAR | Status: DC

## 2021-06-01 MED ORDER — SODIUM CHLORIDE 0.9% FLUSH: 3.0000 mL | INTRAVENOUS | Status: DC | PRN

## 2021-06-01 NOTE — ED Triage Notes (Signed)
Pt presents to ED via [facility] emergency traffic from [facility]. Per facility, pt slide off side of the bed, were unable to palpate pulse, CPR preformed by staff for approx 2 min. Upon EMS arrival, strong carotid pulse noted, EMS assisted with ventilations, CBG 226. Pt arrived to ED, assisted ventilations via trach discontinued, pt noted to be incontinent of urine, history of seizures. EDP at bedside.   ?

## 2021-06-01 NOTE — ED Provider Notes (Signed)
[facility] Provider Note   CSN: [id number] Arrival date & time: 06/01/21  1012     History  Chief Complaint  Patient presents with   Unresponsive     Stacy Moran is a 66 y.o. female.  according to the nursing home, the patient was in her normal state of health this morning and then all of a sudden she fell off the bed.  They went in to check on her and she was pulseless.  They started chest compressions immediately.  The paramedics stated they arrived in a couple minutes and she had regained her pulse and her blood pressure.  The history is provided by the nursing home. No language interpreter was used.  Cardiac Arrest Witnessed by:  Healthcare provider (Nursing home staff) Incident location:  Nursing home Time since incident:  30 minutes Time before BLS initiated:  Immediate Time before ALS initiated:  Immediate Condition upon EMS arrival:  Agonal respirations Pulse:  Absent Treatments prior to arrival:  ACLS protocol     Home Medications Prior to Admission medications   Medication Sig Start Date End Date Taking? Authorizing Provider  acetaminophen (TYLENOL) 325 MG tablet Place 2 tablets (650 mg total) into feeding tube every 6 (six) hours as needed for mild pain. 05/20/20  Yes Angiulli, [name], PA-C  ALPRAZolam Duanne Moron) 0.5 MG tablet Take 0.5 mg by mouth 3 (three) times daily as needed. 04/23/21  Yes [provider]  amLODipine (NORVASC) 5 MG tablet Place 1 tablet (5 mg total) into feeding tube daily. 02/19/21  Yes Nolberto Hanlon, MD  chlorhexidine (PERIDEX) 0.12 % solution 15 mLs by Mouth Rinse route 2 (two) times daily. 10/07/20  Yes Welborn, Ryan, DO  DULoxetine (CYMBALTA) 20 MG capsule Take 20 mg by mouth daily. 05/20/21  Yes [provider]  fluticasone (FLONASE) 50 MCG/ACT nasal spray Place 1 spray into both nostrils 2 (two) times daily. 02/18/21  Yes Nolberto Hanlon, MD  glycopyrrolate (ROBINUL) 1 MG tablet Take 1 tablet (1 mg total)  by mouth 2 (two) times daily. Patient taking differently: Place 1 mg into feeding tube 2 (two) times daily. 02/18/21  Yes Nolberto Hanlon, MD  guaiFENesin (ROBITUSSIN) 100 MG/5ML liquid Place 10 mLs into feeding tube every 8 (eight) hours. 02/18/21  Yes Nolberto Hanlon, MD  ipratropium-albuterol (DUONEB) 0.5-2.5 (3) MG/3ML SOLN Take 3 mLs by nebulization every 4 (four) hours as needed. 12/21/20  Yes Bonnielee Haff, MD  levETIRAcetam (KEPPRA) 100 MG/ML solution Place 7.5 mLs (750 mg total) into feeding tube 2 (two) times daily. 10/07/20  Yes [name], DO  levothyroxine (SYNTHROID) 100 MCG tablet Place 1 tablet (100 mcg total) into feeding tube daily at 6 (six) AM. 07/16/20  Yes [name], MD  Nutritional Supplements (FEEDING SUPPLEMENT, OSMOLITE 1.2 CAL,) LIQD Place 355 mLs into feeding tube 5 (five) times daily. 02/18/21  Yes Nolberto Hanlon, MD  polyethylene glycol (MIRALAX / GLYCOLAX) 17 g packet Place 17 g into feeding tube 2 (two) times daily. 10/07/20  Yes Welborn, Ryan, DO  QUEtiapine (SEROQUEL) 50 MG tablet Place 1 tablet (50 mg total) into feeding tube 2 (two) times daily. 12/21/20 06/01/21 Yes Bonnielee Haff, MD  senna (SENOKOT) 8.6 MG TABS tablet Place 1 tablet (8.6 mg total) into feeding tube daily. 10/08/20  Yes [name], DO  Water For Irrigation, Sterile (FREE WATER) SOLN Place 150 mLs into feeding tube every 6 (six) hours. 02/18/21  Yes Nolberto Hanlon, MD  docusate (COLACE) 50 MG/5ML liquid Place 10  mLs (100 mg total) into feeding tube 2 (two) times daily as needed for mild constipation. Patient not taking: Reported on 06/01/2021 02/18/21   Nolberto Hanlon, MD  hydrALAZINE (APRESOLINE) 50 MG tablet Place 1 tablet (50 mg total) into feeding tube every 8 (eight) hours. 08/09/20 10/07/20  [name], MD      Allergies    Patient has no known allergies.    Review of Systems   Review of Systems  Unable to perform ROS: Acuity of condition   Physical Exam Updated Vital Signs BP (!)  174/99    Pulse 77    Temp 97.6 F (36.4 C) (Oral)    Resp (!) 22    Ht '5\' 6"'$  (1.676 m)    Wt 61.6 kg    SpO2 99%    BMI 21.92 kg/m  Physical Exam Vitals and nursing note reviewed.  Constitutional:      Appearance: She is well-developed.     Comments: Lethargic  HENT:     Head: Normocephalic.     Nose: Nose normal.     Mouth/Throat:     Mouth: Mucous membranes are moist.  Eyes:     General: No scleral icterus.    Conjunctiva/sclera: Conjunctivae normal.  Neck:     Thyroid: No thyromegaly.  Cardiovascular:     Rate and Rhythm: Normal rate and regular rhythm.     Heart sounds: No murmur heard.   No friction rub. No gallop.  Pulmonary:     Breath sounds: No stridor. No wheezing or rales.  Chest:     Chest wall: No tenderness.  Abdominal:     General: There is no distension.     Tenderness: There is no abdominal tenderness. There is no rebound.  Musculoskeletal:        General: Normal range of motion.     Cervical back: Neck supple.  Lymphadenopathy:     Cervical: No cervical adenopathy.  Skin:    Findings: No erythema or rash.  Neurological:     Motor: No abnormal muscle tone.     Coordination: Coordination normal.     Comments: Oriented to person  Psychiatric:        Behavior: Behavior normal.    ED Results / Procedures / Treatments   Labs (all labs ordered are listed, but only abnormal results are displayed) Labs Reviewed  CBC WITH DIFFERENTIAL/PLATELET - Abnormal; Notable for the following components:      Result Value   Platelets 122 (*)    All other components within normal limits  COMPREHENSIVE METABOLIC PANEL - Abnormal; Notable for the following components:   Potassium 3.4 (*)    Glucose, Bld 223 (*)    AST 58 (*)    ALT 54 (*)    Total Bilirubin 0.2 (*)    All other components within normal limits  D-DIMER, QUANTITATIVE - Abnormal; Notable for the following components:   D-Dimer, Quant 1.35 (*)    All other components within normal limits  TROPONIN  I (HIGH SENSITIVITY)  TROPONIN I (HIGH SENSITIVITY)    EKG EKG Interpretation  Date/Time:  Monday June 01 2021 11:52:45 EST Ventricular Rate:  80 PR Interval:  193 QRS Duration: 88 QT Interval:  413 QTC Calculation: 477 R Axis:   43 Text Interpretation: Sinus rhythm Probable anteroseptal infarct, old ST elevation, consider inferior injury Confirmed by Milton Ferguson [phone]) on 06/01/2021 1:45:40 PM  Radiology CT Head Wo Contrast  Result Date: 06/01/2021 CLINICAL DATA:  67 year old female  with history of altered mental status. History of seizures. EXAM: CT HEAD WITHOUT CONTRAST TECHNIQUE: Contiguous axial images were obtained from the base of the skull through the vertex without intravenous contrast. RADIATION DOSE REDUCTION: This exam was performed according to the departmental dose-optimization program which includes automated exposure control, adjustment of the mA and/or kV according to patient size and/or use of iterative reconstruction technique. COMPARISON:  Head CT 11/10/2020. FINDINGS: Brain: Previously noted chronic extra-axial fluid collections have decreased in size, compatible with resolving bilateral chronic subdural hematomas/hygromas. Right frontal ventriculostomy shunt catheter with tip terminating in the anterior aspect of the right lateral ventricle. Ventricles appear slightly dilated compared to the prior examination, suggesting potential shunt dysfunction. Patchy and confluent areas of decreased attenuation are noted throughout the deep and periventricular white matter of the cerebral hemispheres bilaterally, compatible with chronic microvascular ischemic disease. No evidence of acute infarction, hemorrhage, or mass lesion/mass effect. Vascular: Atherosclerotic calcifications in the cerebral vasculature. Skull: Status post left suboccipital craniotomy. Sinuses/Orbits: No acute finding. Other: None. IMPRESSION: 1. Interval development of mild hydrocephalus. Clinical correlation for  evidence of ventriculostomy shunt catheter is function is recommended. 2. Decreasing size of small chronic bilateral subdural hematomas/hygromas. 3. Mild chronic microvascular ischemic changes in the cerebral white matter, as above. Electronically Signed   By: Vinnie Langton M.D.   On: 06/01/2021 10:59   DG Chest Port 1 View  Result Date: 06/01/2021 CLINICAL DATA:  Altered mental status EXAM: PORTABLE CHEST 1 VIEW COMPARISON:  05/24/2021 FINDINGS: Transverse diameter of heart is increased. Tip of tracheostomy tube is 6.9 cm above the carina. Ventriculoperitoneal shunt is seen on the right side. There are no signs of alveolar pulmonary edema or focal pulmonary consolidation. There are linear densities in the medial left lower lung fields. There is no pleural effusion or pneumothorax. IMPRESSION: Linear densities in the medial left lower lung fields suggest subsegmental atelectasis. There are no signs of pulmonary edema or focal pulmonary consolidation. Electronically Signed   By: Elmer Picker M.D.   On: 06/01/2021 10:40    Procedures Procedures    Medications Ordered in ED Medications  sodium chloride 0.9 % bolus 1,000 mL (0 mLs Intravenous Stopped 06/01/21 1259)  levETIRAcetam (KEPPRA) IVPB 1000 mg/100 mL premix (0 mg Intravenous Stopped 06/01/21 1054)  iohexol (OMNIPAQUE) 350 MG/ML injection 75 mL (75 mLs Intravenous Contrast Given 06/01/21 1240)    ED Course/ Medical Decision Making/ A&P  CRITICAL CARE Performed by: Milton Ferguson Total critical care time: 45 minutes Critical care time was exclusive of separately billable procedures and treating other patients. Critical care was necessary to treat or prevent imminent or life-threatening deterioration. Critical care was time spent personally by me on the following activities: development of treatment plan with patient and/or surrogate as well as nursing, discussions with consultants, evaluation of patient's response to treatment, examination  of patient, obtaining history from patient or surrogate, ordering and performing treatments and interventions, ordering and review of laboratory studies, ordering and review of radiographic studies, pulse oximetry and re-evaluation of patient's condition.                          Medical Decision Making Amount and/or Complexity of Data Reviewed Labs: ordered. Radiology: ordered. ECG/medicine tests: ordered.  Risk Prescription drug management. Decision regarding hospitalization.  This patient presents to the ED for concern of cardiac arrest, this involves an extensive number of treatment options, and is a complaint that carries with it a high  risk of complications and morbidity.  The differential diagnosis includes MI PE   Co morbidities that complicate the patient evaluation  Hypertension   Additional history obtained:  Additional history obtained from nurse practitioner at the nursing External records from outside source obtained and reviewed including hospital record   Lab Tests:  I Ordered, and personally interpreted labs.  The pertinent results include: CBC and chemistries unremarkable   Imaging Studies ordered:  I ordered imaging studies including CT head I independently visualized and interpreted imaging which showed shows some hydrocephalus I agree with the radiologist interpretation   Cardiac Monitoring:  The patient was maintained on a cardiac monitor.  I personally viewed and interpreted the cardiac monitored which showed an underlying rhythm of: Normal sinus rhythm   Medicines ordered and prescription drug management:  I ordered medication including normal saline for dehydration Reevaluation of the patient after these medicines showed that the patient improved I have reviewed the patients home medicines and have made adjustments as needed   Test Considered:  MRI   Critical Interventions:  Admit to hospitalist   Consultations Obtained:  No  consultants except for the hospitalist which will admit Problem List / ED Course:  Cardiopulmonary    Social Determinants of Health: Nursing home patient      Patient with cardiac arrest of unknown cause.  Labs and x-rays are unremarkable.  She will be admitted to the hospitalist for monitoring       Final Clinical Impression(s) / ED Diagnoses Final diagnoses:  Cardiac arrest [facility])    Rx / DC Orders ED Discharge Orders     None         Milton Ferguson, MD 06/02/21 1758

## 2021-06-01 NOTE — Assessment & Plan Note (Signed)
-   Flushing adequately, continue to present meds per PEG tube ?

## 2021-06-01 NOTE — Assessment & Plan Note (Signed)
-   Patient at baseline, ?-Awake alert oriented, difficult verbal communication due to tracheostomy ?

## 2021-06-01 NOTE — Assessment & Plan Note (Addendum)
-   Apparently patient was found pulseless at the facility residing in ?-Status post approximately 2 minutes of CPR at the facility ?-EMS reported they found strong carotid pulse upon arrival ?-We will admit to telemetry floor -stepdown under close observation ?-Continue checking vitals including neurochecks every 4 hours ?- stable  troponins x2 negative, no specific findings on EKG ?-Labs within normal limits with exception of mild transaminitis, mild hypokalemia ?-Denies any chest pain, or any other acute changes daughter present at bedside confirms ?-No events overnight ?

## 2021-06-01 NOTE — Assessment & Plan Note (Signed)
-   Gastrostomy tube in place, tube feeds will be resumed ? ?

## 2021-06-01 NOTE — Hospital Course (Signed)
Stacy Moran is a 8 which resounding a currently a nursing facility with extensive history of CVA, HTN, seizures, extensive history of subdural hematoma, ruptured aneurysm, with a chronic tracheostomy, and gastrostomy presented today after patient had a sudden fall, was found pulseless, CPR was initiated approximately 2 minutes when EMS arrived strong carotid pulse was identified, assisted ventilation was initiated, CBG was 226, patient brought to the ED for further evaluation ? ?No recent history of illnesses ?Evaluation in ED on 05/24/2021 for tracheostomy reevaluation, obstruction, and clearing..  ? ? ?ED: ?Blood pressure (!) 174/99, pulse 77, temperature 97.6 ?F (36.4 ?C), temperature source Oral, resp. rate (!) 22, height '5\' 6"'$  (1.676 m), weight 61.6 kg, SpO2 99 %. ? ?LABs: CBC CMP reviewed within normal limits potassium 3.4, mildly elevated LFTs, AST 58, ALT 54, T bilirubin 0.3, glucose 110, 223 ?Troponin 4, 11, elevated D-dimer 1.3, ? ?CTA : No pulmonary embolism, nonspecific finding, new metastatic breast cancer ? ? ?CT head: IMPRESSION: ?1. Interval development of mild hydrocephalus. Clinical correlation ?for evidence of ventriculostomy shunt catheter is function is ?recommended. ?2. Decreasing size of small chronic bilateral subdural ?hematomas/hygromas. ?3. Mild chronic microvascular ischemic changes ?

## 2021-06-01 NOTE — H&P (Signed)
History and Physical   Patient: Stacy Moran                            PCP: Everlena Cooper, NP                    DOB: 18-Jul-1955            DOA: 06/01/2021 ZOX:[id number]             DOS: 06/01/2021, 4:29 PM  Everlena Cooper, NP  Patient coming from:   HOME  I have personally reviewed patient's medical records, in electronic medical records, including:  [facility], and care everywhere.    Chief Complaint:   Chief Complaint  Patient presents with   Unresponsive     History of present illness:    Stacy Moran is a 66 which resounding a currently a nursing facility with extensive history of CVA, HTN, seizures, extensive history of subdural hematoma, ruptured aneurysm, with a chronic tracheostomy, and gastrostomy presented today after patient had a sudden fall, was found pulseless, CPR was initiated approximately 2 minutes when EMS arrived strong carotid pulse was identified, assisted ventilation was initiated, CBG was 226, patient brought to the ED for further evaluation  No recent history of illnesses Evaluation in ED on 05/24/2021 for tracheostomy reevaluation, obstruction, and clearing..    ED: Blood pressure (!) 174/99, pulse 77, temperature 97.6 F (36.4 C), temperature source Oral, resp. rate (!) 22, height '5\' 6"'$  (1.676 m), weight 61.6 kg, SpO2 99 %.  LABs: CBC CMP reviewed within normal limits potassium 3.4, mildly elevated LFTs, AST 58, ALT 54, T bilirubin 0.3, glucose 110, 223 Troponin 4, 11, elevated D-dimer 1.3,  CTA : No pulmonary embolism, nonspecific finding, new metastatic breast cancer   CT head: IMPRESSION: 1. Interval development of mild hydrocephalus. Clinical correlation for evidence of ventriculostomy shunt catheter is function is recommended. 2. Decreasing size of small chronic bilateral subdural hematomas/hygromas. 3. Mild chronic microvascular ischemic changes    Patient Denies having: Fever, Chills, Cough, SOB, Chest Pain, Abd pain, N/V/D,  headache, dizziness, lightheadedness,  Dysuria, Joint pain, rash, open wounds  ED Course:   Blood pressure (!) 174/99, pulse 77, temperature 97.6 F (36.4 C), temperature source Oral, resp. rate (!) 22, height '5\' 6"'$  (1.676 m), weight 61.6 kg, SpO2 99 %. Abnormal labs;   Review of Systems: As per HPI, otherwise 10 point review of systems were negative.   ----------------------------------------------------------------------------------------------------------------------  No Known Allergies  Home MEDs:  Prior to Admission medications   Medication Sig Start Date End Date Taking? Authorizing Provider  acetaminophen (TYLENOL) 325 MG tablet Place 2 tablets (650 mg total) into feeding tube every 6 (six) hours as needed for mild pain. 05/20/20  Yes Angiulli, [name], PA-C  ALPRAZolam Duanne Moron) 0.5 MG tablet Take 0.5 mg by mouth 3 (three) times daily as needed. 04/23/21  Yes [provider]  amLODipine (NORVASC) 5 MG tablet Place 1 tablet (5 mg total) into feeding tube daily. 02/19/21  Yes Nolberto Hanlon, MD  chlorhexidine (PERIDEX) 0.12 % solution 15 mLs by Mouth Rinse route 2 (two) times daily. 10/07/20  Yes Welborn, [place], DO  DULoxetine (CYMBALTA) 20 MG capsule Take 20 mg by mouth daily. 05/20/21  Yes [provider]  fluticasone (FLONASE) 50 MCG/ACT nasal spray Place 1 spray into both nostrils 2 (two) times daily. 02/18/21  Yes Nolberto Hanlon, MD  glycopyrrolate (ROBINUL) 1 MG tablet Take 1  tablet (1 mg total) by mouth 2 (two) times daily. Patient taking differently: Place 1 mg into feeding tube 2 (two) times daily. 02/18/21  Yes Nolberto Hanlon, MD  guaiFENesin (ROBITUSSIN) 100 MG/5ML liquid Place 10 mLs into feeding tube every 8 (eight) hours. 02/18/21  Yes Nolberto Hanlon, MD  ipratropium-albuterol (DUONEB) 0.5-2.5 (3) MG/3ML SOLN Take 3 mLs by nebulization every 4 (four) hours as needed. 12/21/20  Yes Bonnielee Haff, MD  levETIRAcetam (KEPPRA) 100 MG/ML solution Place 7.5 mLs (750 mg  total) into feeding tube 2 (two) times daily. 10/07/20  Yes [name], DO  levothyroxine (SYNTHROID) 100 MCG tablet Place 1 tablet (100 mcg total) into feeding tube daily at 6 (six) AM. 07/16/20  Yes [name], MD  Nutritional Supplements (FEEDING SUPPLEMENT, OSMOLITE 1.2 CAL,) LIQD Place 355 mLs into feeding tube 5 (five) times daily. 02/18/21  Yes Nolberto Hanlon, MD  polyethylene glycol (MIRALAX / GLYCOLAX) 17 g packet Place 17 g into feeding tube 2 (two) times daily. 10/07/20  Yes Welborn, [place], DO  QUEtiapine (SEROQUEL) 50 MG tablet Place 1 tablet (50 mg total) into feeding tube 2 (two) times daily. 12/21/20 06/01/21 Yes Bonnielee Haff, MD  senna (SENOKOT) 8.6 MG TABS tablet Place 1 tablet (8.6 mg total) into feeding tube daily. 10/08/20  Yes [name], DO  Water For Irrigation, Sterile (FREE WATER) SOLN Place 150 mLs into feeding tube every 6 (six) hours. 02/18/21  Yes Nolberto Hanlon, MD  docusate (COLACE) 50 MG/5ML liquid Place 10 mLs (100 mg total) into feeding tube 2 (two) times daily as needed for mild constipation. Patient not taking: Reported on 06/01/2021 02/18/21   Nolberto Hanlon, MD  hydrALAZINE (APRESOLINE) 50 MG tablet Place 1 tablet (50 mg total) into feeding tube every 8 (eight) hours. 08/09/20 10/07/20  [name], MD    PRN MEDs: sodium chloride, acetaminophen, ALPRAZolam, hydrALAZINE, HYDROmorphone (DILAUDID) injection, ipratropium, levalbuterol, ondansetron **OR** ondansetron (ZOFRAN) IV, oxyCODONE, senna-docusate, sodium chloride flush, traZODone  Past Medical History:  Diagnosis Date   Acute respiratory failure ([place])    Hypertension    Prosthetic eye globe    Ruptured aneurysm of artery (HCC)    Seizures ([place])    Status post insertion of percutaneous endoscopic gastrostomy (PEG) tube ([place])    Stroke ([place])    Subdural hematoma    Tracheostomy dependent ([place])    Tracheostomy present [facility])     Past Surgical History:  Procedure Laterality Date   BURR HOLE  N/A 03/18/2020   Procedure: Haskell Flirt;  Surgeon: [name], MD;  Location: [place];  Service: Neurosurgery;  Laterality: N/A;   CRANIOTOMY Left 01/30/2020   Procedure: LEFT FAR LATERAL CRANIOTOMY FOR ANEURYSM CLIPPING;  Surgeon: [name], MD;  Location: [place];  Service: Neurosurgery;  Laterality: Left;   DIRECT LARYNGOSCOPY N/A 02/29/2020   Procedure: DIRECT LARYNGOSCOPY;  Surgeon: [name], MD;  Location: [place];  Service: ENT;  Laterality: N/A;   DIRECT LARYNGOSCOPY N/A 10/03/2020   Procedure: DIRECT LARYNGOSCOPY w/esophagascopy;  Surgeon: [name], MD;  Location: [place];  Service: ENT;  Laterality: N/A;   INTUBATION-ENDOTRACHEAL WITH TRACHEOSTOMY STANDBY N/A 12/14/2020   Procedure: Warrick Parisian WITH TRACHEOSTOMY STANDBY;  Surgeon: Carloyn Manner, MD;  Location: [facility];  Service: ENT;  Laterality: N/A;   IR ANGIO INTRA EXTRACRAN SEL INTERNAL CAROTID BILAT MOD SED  01/30/2020   IR ANGIO VERTEBRAL SEL VERTEBRAL UNI L MOD SED  01/30/2020   IR CM INJ ANY COLONIC TUBE W/FLUORO  07/03/2020  IR GASTROSTOMY TUBE MOD SED  02/22/2020   IR REPLC GASTRO/COLONIC TUBE PERCUT W/FLUORO  07/03/2020   LAPAROSCOPIC REVISION VENTRICULAR-PERITONEAL (V-P) SHUNT N/A 03/18/2020   Procedure: LAPAROSCOPIC REVISION VENTRICULAR-PERITONEAL (V-P) SHUNT;  Surgeon: [name], MD;  Location: [place];  Service: General;  Laterality: N/A;   RADIOLOGY WITH ANESTHESIA N/A 01/30/2020   Procedure: IR WITH ANESTHESIA;  Surgeon: [name], MD;  Location: [place];  Service: Radiology;  Laterality: N/A;   THYROIDECTOMY N/A 02/08/2020   Procedure: THYROIDECTOMY;  Surgeon: [name], MD;  Location: [place];  Service: ENT;  Laterality: N/A;   TRACHEOSTOMY TUBE PLACEMENT N/A 02/08/2020   Procedure: TRACHEOSTOMY;  Surgeon: [name], MD;  Location: [place];  Service: ENT;  Laterality: N/A;   TRACHEOSTOMY TUBE PLACEMENT N/A 02/29/2020   Procedure: TRACHEOSTOMY EXCHANGE;  Surgeon: [name], MD;   Location: [place];  Service: ENT;  Laterality: N/A;   VENTRICULOPERITONEAL SHUNT N/A 03/18/2020   Procedure: RIGHT SHUNT INSERTION VENTRICULAR-PERITONEAL/ BURR HOLE Evacuation of Subdural Hematoma;  Surgeon: [name], MD;  Location: [place];  Service: Neurosurgery;  Laterality: N/A;     reports that she has quit smoking. She has never used smokeless tobacco. She reports that she does not currently use alcohol. She reports that she does not currently use drugs.   No family history on file.  Physical Exam:   Vitals:   06/01/21 1400 06/01/21 1415 06/01/21 1430 06/01/21 1626  BP: (!) 153/92  (!) 174/99   Pulse: 69  77   Resp: 16  (!) 22   Temp:      TempSrc:      SpO2: 100% 100% 99% 100%  Weight:      Height:       Constitutional: NAD, calm, comfortable Eyes: PERRL, lids and conjunctivae normal ENMT: Mucous membranes are moist. Posterior pharynx clear of any exudate or lesions.Normal dentition.  Neck: normal, supple, no masses, no thyromegaly Respiratory: clear to auscultation bilaterally, no wheezing, no crackles. Normal respiratory effort. No accessory muscle use.  Cardiovascular: Regular rate and rhythm, no murmurs / rubs / gallops. No extremity edema. 2+ pedal pulses. No carotid bruits.  Abdomen: no tenderness, no masses palpated. No hepatosplenomegaly. Bowel sounds positive.  Musculoskeletal: no clubbing / cyanosis. No joint deformity upper and lower extremities. Good ROM, no contractures. Normal muscle tone.  Neurologic: CN II-XII grossly intact. Sensation intact, DTR normal. Strength 5/5 in all 4.  Psychiatric: Normal judgment and insight. Alert and oriented x 3. Normal mood.  Skin: no rashes, lesions, ulcers. No induration Decubitus/ulcers:  Wounds: per nursing documentation         Labs on admission:    I have personally reviewed following labs and imaging studies  CBC: Recent Labs  Lab 06/01/21 1017  WBC 6.4  NEUTROABS 2.1  HGB 12.8  HCT 41.6  MCV  85.2  PLT 622*   Basic Metabolic Panel: Recent Labs  Lab 06/01/21 1017 06/01/21 1214  NA 138  --   K 3.4*  --   CL 100  --   CO2 27  --   GLUCOSE 223*  --   BUN 23  --   CREATININE 0.76  --   CALCIUM 9.4  --   MG  --  2.2   GFR: Estimated Creatinine Clearance: 67.4 mL/min (by C-G formula based on SCr of 0.76 mg/dL). Liver Function Tests: Recent Labs  Lab 06/01/21 1017  AST 58*  ALT 54*  ALKPHOS 71  BILITOT 0.2*  PROT  7.9  ALBUMIN 3.8    Urine analysis:    Component Value Date/Time   COLORURINE YELLOW 07/30/2020 2343   APPEARANCEUR CLEAR 07/30/2020 2343   LABSPEC 1.013 07/30/2020 2343   PHURINE 7.0 07/30/2020 2343   GLUCOSEU NEGATIVE 07/30/2020 2343   HGBUR NEGATIVE 07/30/2020 2343   BILIRUBINUR NEGATIVE 07/30/2020 2343   KETONESUR NEGATIVE 07/30/2020 2343   PROTEINUR NEGATIVE 07/30/2020 2343   NITRITE NEGATIVE 07/30/2020 2343   LEUKOCYTESUR NEGATIVE 07/30/2020 2343    Last A1C:  Lab Results  Component Value Date   HGBA1C 6.2 (H) 12/14/2020     Radiologic Exams on Admission:   CT Head Wo Contrast  Result Date: 06/01/2021 CLINICAL DATA:  66 year old female with history of altered mental status. History of seizures. EXAM: CT HEAD WITHOUT CONTRAST TECHNIQUE: Contiguous axial images were obtained from the base of the skull through the vertex without intravenous contrast. RADIATION DOSE REDUCTION: This exam was performed according to the departmental dose-optimization program which includes automated exposure control, adjustment of the mA and/or kV according to patient size and/or use of iterative reconstruction technique. COMPARISON:  Head CT 11/10/2020. FINDINGS: Brain: Previously noted chronic extra-axial fluid collections have decreased in size, compatible with resolving bilateral chronic subdural hematomas/hygromas. Right frontal ventriculostomy shunt catheter with tip terminating in the anterior aspect of the right lateral ventricle. Ventricles appear  slightly dilated compared to the prior examination, suggesting potential shunt dysfunction. Patchy and confluent areas of decreased attenuation are noted throughout the deep and periventricular white matter of the cerebral hemispheres bilaterally, compatible with chronic microvascular ischemic disease. No evidence of acute infarction, hemorrhage, or mass lesion/mass effect. Vascular: Atherosclerotic calcifications in the cerebral vasculature. Skull: Status post left suboccipital craniotomy. Sinuses/Orbits: No acute finding. Other: None. IMPRESSION: 1. Interval development of mild hydrocephalus. Clinical correlation for evidence of ventriculostomy shunt catheter is function is recommended. 2. Decreasing size of small chronic bilateral subdural hematomas/hygromas. 3. Mild chronic microvascular ischemic changes in the cerebral white matter, as above. Electronically Signed   By: Vinnie Langton M.D.   On: 06/01/2021 10:59   DG Chest Port 1 View  Result Date: 06/01/2021 CLINICAL DATA:  Altered mental status EXAM: PORTABLE CHEST 1 VIEW COMPARISON:  05/24/2021 FINDINGS: Transverse diameter of heart is increased. Tip of tracheostomy tube is 6.9 cm above the carina. Ventriculoperitoneal shunt is seen on the right side. There are no signs of alveolar pulmonary edema or focal pulmonary consolidation. There are linear densities in the medial left lower lung fields. There is no pleural effusion or pneumothorax. IMPRESSION: Linear densities in the medial left lower lung fields suggest subsegmental atelectasis. There are no signs of pulmonary edema or focal pulmonary consolidation. Electronically Signed   By: Elmer Picker M.D.   On: 06/01/2021 10:40    EKG:   Independently reviewed.  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   H/O total thyroidectomy   Tracheostomy status (HCC)   PEG (percutaneous endoscopic gastrostomy) status ([place])   Seizure-like activity (HCC)   Dysphagia, post-stroke   Essential hypertension   Seizure prophylaxis   Postoperative hypothyroidism   Bilateral  vocal cord paralysis   Altered mental status   Abnormal CT of the chest   Assessment and Plan: * Cardiac arrest ([place]) - Apparently patient was found pulseless at the facility residing in -Status post approximately 2 minutes of CPR at the facility -EMS reported they found strong carotid pulse upon arrival -We will admit to telemetry floor -stepdown under close observation -Continue checking vitals including neurochecks every 4 hours -Currently stabl, troponins x2 negative, no specific findings on EKG -Labs within normal limits with exception of mild transaminitis, mild hypokalemia -Denies any chest pain, or any other acute changes daughter present at bedside confirms  Abnormal CT of the chest -- CTA chest was obtained, radiologist called, negative for acute pulmonary embolism -- MPRESSION: 1. No evidence of acute pulmonary embolism. No acute cardiopulmonary disease. 2. 3.3 cm mass over the outer midportion of the left breast with associated skin thickening and associated left axillary and retropectoral adenopathy. Findings are concerning for primary breast carcinoma. Recommend further evaluation with diagnostic mammography and ultrasound. 3. 6 cm well-defined cystic mass over the middle mediastinum abutting the left posterolateral aspect of the esophagus. 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 4.  Emphysema. Minimal atherosclerotic calcification over the left anterior descending coronary artery. 5. 1.7 cm hypodensity over the upper central aspect of the right lobe of the liver. In light of the above findings, this may represent a metastatic focus. Recommend CT abdomen/pelvis with intravenous contrast.    -The findings were discussed in detail with patient's daughter at bedside -Was emphasized and we will help the patient to pursue with further work-up...  Altered mental status - Patient at baseline, -Awake alert oriented, difficult verbal communication due to tracheostomy  Postoperative hypothyroidism - Continue Synthroid  Seizure prophylaxis - On Keppra will be continued -Seizure precautions  Essential hypertension - Currently stable -Continue home medication of Norvasc -As needed IV hydralazine  Dysphagia, post-stroke - PEG tube in place -Tracheostomy in place -Continuing with tube feeds   Seizure-like activity (HCC) - Continue Keppra 750 mg twice daily per PEG tube -1 g IV was given -No reported history of seizure activities  PEG (percutaneous endoscopic gastrostomy) status ([place]) - Flushing adequately, continue to present meds per PEG tube  Tracheostomy status ([place]) - Stable functional  H/O total thyroidectomy - O2 via tracheostomy, currently functional -In no respiratory distress -Continue O2 supplements  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 - Gastrostomy tube in place, tube feeds will be resumed   Hypokalemia - Repleting with 20 mEq IV, checking magnesium level  Ruptured cerebral aneurysm (HCC) - History of ruptured aneurysm -Currently stable, CT reviewed chronic changes noted Negative any acute abnormalities or bleed     Consults called:  None -------------------------------------------------------------------------------------------------------------------------------------------- DVT prophylaxis:  Place and maintain sequential  compression device Start: 06/01/21 1505 TED hose Start: 06/01/21 1452 SCDs Start: 06/01/21 1452   Code Status:   Code Status: Full Code   Admission status: Patient will be admitted as Observation, with a less than 2 midnight length of stay. Level of care: Stepdown   Family Communication:  none at bedside  (The above findings and plan of care has been discussed with patient in detail, the patient expressed understanding and agreement of above plan)  --------------------------------------------------------------------------------------------------------------------------------------------------  Disposition Plan:  Anticipated 1-2 days Status is: Inpatient Remains inpatient appropriate because: Close monitoring for possible cardiac arrest on the field... ----------------------------------------------------------------------------------------------------------------------------------------------------  Time spent: > than  39  Min.   SIGNED: Deatra James, MD, FHM. [facility],  Pager (Please use [email or web address] to page to text)  If 7PM-7AM, please contact night-coverage [email or web address],  06/01/2021, 4:29 PM

## 2021-06-01 NOTE — Assessment & Plan Note (Signed)
-   O2 via tracheostomy, currently functional ?-In no respiratory distress ?-Continue O2 supplements ?

## 2021-06-01 NOTE — Assessment & Plan Note (Addendum)
--   CTA chest was obtained, radiologist called, negative for acute pulmonary embolism ?-- MPRESSION: ?1. No evidence of acute pulmonary embolism. No acute cardiopulmonary disease. ?2. 3.3 cm mass over the outer midportion of the left breast with ?associated skin thickening and associated left axillary and ?retropectoral adenopathy. Findings are concerning for primary breast carcinoma. Recommend further evaluation with diagnostic mammography and ultrasound. ?3. 6 cm well-defined cystic mass over the middle mediastinum ?abutting the left posterolateral aspect of the esophagus. 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 ?4. Emphysema. Minimal atherosclerotic calcification over the left ?anterior descending coronary artery. ?5. 1.7 cm hypodensity over the upper central aspect of the right ?lobe of the liver. In light of the above findings, this may ?represent a metastatic focus. Recommend CT abdomen/pelvis with intravenous contrast.  ? ? ?-The findings were discussed in detail with patient's daughter at bedside ?-Oncologist Dr. Delton Coombes was consulted, arrangement has been made for outpatient mammogram/ultrasound guided biopsy of the left breast mass, and PET scan. ?

## 2021-06-01 NOTE — Assessment & Plan Note (Signed)
-   Continue Keppra 750 mg twice daily per PEG tube ?-1 g IV was given ?-No reported history of seizure activities ?

## 2021-06-01 NOTE — Assessment & Plan Note (Addendum)
-   History of ruptured aneurysm ?-Currently stable, CT reviewed chronic changes noted ?Negative any acute abnormalities or bleed ?-Patient's neuro surgeon was updated ?

## 2021-06-01 NOTE — Assessment & Plan Note (Signed)
-   Currently stable ?-Continue home medication of Norvasc ?-As needed IV hydralazine ?

## 2021-06-01 NOTE — Assessment & Plan Note (Addendum)
-   Was repleted ?

## 2021-06-01 NOTE — Assessment & Plan Note (Signed)
-   Stable functional ?

## 2021-06-01 NOTE — Assessment & Plan Note (Signed)
-   PEG tube in place ?-Tracheostomy in place ?-Continuing with tube feeds  ?

## 2021-06-01 NOTE — Assessment & Plan Note (Signed)
Continue Synthroid °

## 2021-06-01 NOTE — Assessment & Plan Note (Signed)
-   On Keppra will be continued ?-Seizure precautions ?

## 2021-06-02 ENCOUNTER — Other Ambulatory Visit (HOSPITAL_COMMUNITY): Payer: Self-pay

## 2021-06-02 ENCOUNTER — Other Ambulatory Visit (HOSPITAL_COMMUNITY): Payer: Self-pay | Admitting: Hematology

## 2021-06-02 DIAGNOSIS — N6321 Unspecified lump in the left breast, upper outer quadrant: Secondary | ICD-10-CM

## 2021-06-02 DIAGNOSIS — N6320 Unspecified lump in the left breast, unspecified quadrant: Secondary | ICD-10-CM

## 2021-06-02 DIAGNOSIS — I69891 Dysphagia following other cerebrovascular disease: Secondary | ICD-10-CM

## 2021-06-02 DIAGNOSIS — Z930 Tracheostomy status: Secondary | ICD-10-CM

## 2021-06-02 DIAGNOSIS — R9389 Abnormal findings on diagnostic imaging of other specified body structures: Secondary | ICD-10-CM

## 2021-06-02 LAB — BASIC METABOLIC PANEL
Anion gap: 9 (ref 5–15)
BUN: 17 mg/dL (ref 8–23)
CO2: 28 mmol/L (ref 22–32)
Calcium: 9.7 mg/dL (ref 8.9–10.3)
Chloride: 106 mmol/L (ref 98–111)
Creatinine, Ser: 0.55 mg/dL (ref 0.44–1.00)
GFR, Estimated: >60 mL/min (ref >60)
Glucose, Bld: 84 mg/dL (ref 70–99)
Potassium: 4 mmol/L (ref 3.5–5.1)
Sodium: 143 mmol/L (ref 135–145)

## 2021-06-02 LAB — MRSA NEXT GEN BY PCR, NASAL: MRSA by PCR Next Gen: NOT DETECTED

## 2021-06-02 LAB — CBC
HCT: 41.8 % (ref 36.0–46.0)
Hemoglobin: 13.3 g/dL (ref 12.0–15.0)
MCH: 26.1 pg (ref 26.0–34.0)
MCHC: 31.8 g/dL (ref 30.0–36.0)
MCV: 82.1 fL (ref 80.0–100.0)
Platelets: 141 10*3/uL — ABNORMAL LOW (ref 150–400)
RBC: 5.09 MIL/uL (ref 3.87–5.11)
RDW: 13.8 % (ref 11.5–15.5)
WBC: 6.1 10*3/uL (ref 4.0–10.5)
nRBC: 0 % (ref 0.0–0.2)

## 2021-06-02 LAB — PROTIME-INR
INR: 1 (ref 0.8–1.2)
Prothrombin Time: 13.2 seconds (ref 11.4–15.2)

## 2021-06-02 LAB — HIV ANTIBODY (ROUTINE TESTING W REFLEX): HIV Screen 4th Generation wRfx: NONREACTIVE

## 2021-06-02 NOTE — Consult Note (Signed)
[facility] Consultation Oncology  Name: Stacy Moran      MRN: [id number]    Location: [id number]/BH41-93  Date: 06/02/2021 Time:9:51 AM   REFERRING PHYSICIAN: Dr. Roger Shelter  REASON FOR CONSULT: Left breast mass with axillary lymphadenopathy   DIAGNOSIS: Highly suspicious left breast cancer  HISTORY OF PRESENT ILLNESS: [name] is a 66 year old very pleasant African-American female who is seen in consultation today at the request of Dr. Roger Shelter for further work-up and management of newly diagnosed left breast cancer.  She has been a resident at [facility] for the past couple of years due to CVA, seizures, history of subdural hematoma, chronic tracheostomy and gastrostomy.  She was evaluated in the ER for being found pulseless, status post CPR.  CT head showed mild hydrocephalus with decreasing size of small chronic bilateral subdural hematomas.  CT angiogram showed incidental 3.3 cm mass in the left breast with associated skin thickening and left axillary and retropectoral adenopathy.  1.7 cm hypodensity over the upper central aspect of the right lobe of the liver.  She has been a resident at [facility] for the last couple of years.  She reports family history of breast cancer in her daughter.  PAST MEDICAL HISTORY:   Past Medical History:  Diagnosis Date   Acute respiratory failure ([place])    Hypertension    Prosthetic eye globe    Ruptured aneurysm of artery (HCC)    Seizures ([place])    Status post insertion of percutaneous endoscopic gastrostomy (PEG) tube ([place])    Stroke ([place])    Subdural hematoma    Tracheostomy dependent ([place])    Tracheostomy present ([place])     ALLERGIES: No Known Allergies    MEDICATIONS: I have reviewed the patient's current medications.     PAST SURGICAL HISTORY Past Surgical History:  Procedure Laterality Date   BURR HOLE N/A 03/18/2020   Procedure: Haskell Flirt;  Surgeon: [name], MD;  Location: [place];  Service: Neurosurgery;  Laterality:  N/A;   CRANIOTOMY Left 01/30/2020   Procedure: LEFT FAR LATERAL CRANIOTOMY FOR ANEURYSM CLIPPING;  Surgeon: [name], MD;  Location: [place];  Service: Neurosurgery;  Laterality: Left;   DIRECT LARYNGOSCOPY N/A 02/29/2020   Procedure: DIRECT LARYNGOSCOPY;  Surgeon: [name], MD;  Location: [place];  Service: ENT;  Laterality: N/A;   DIRECT LARYNGOSCOPY N/A 10/03/2020   Procedure: DIRECT LARYNGOSCOPY w/esophagascopy;  Surgeon: [name], MD;  Location: [place];  Service: ENT;  Laterality: N/A;   INTUBATION-ENDOTRACHEAL WITH TRACHEOSTOMY STANDBY N/A 12/14/2020   Procedure: Warrick Parisian WITH TRACHEOSTOMY STANDBY;  Surgeon: Carloyn Manner, MD;  Location: [facility];  Service: ENT;  Laterality: N/A;   IR ANGIO INTRA EXTRACRAN SEL INTERNAL CAROTID BILAT MOD SED  01/30/2020   IR ANGIO VERTEBRAL SEL VERTEBRAL UNI L MOD SED  01/30/2020   IR CM INJ ANY COLONIC TUBE W/FLUORO  07/03/2020   IR GASTROSTOMY TUBE MOD SED  02/22/2020   IR [facility] GASTRO/COLONIC TUBE PERCUT W/FLUORO  07/03/2020   LAPAROSCOPIC REVISION VENTRICULAR-PERITONEAL (V-P) SHUNT N/A 03/18/2020   Procedure: LAPAROSCOPIC REVISION VENTRICULAR-PERITONEAL (V-P) SHUNT;  Surgeon: [name], MD;  Location: [place];  Service: General;  Laterality: N/A;   RADIOLOGY WITH ANESTHESIA N/A 01/30/2020   Procedure: IR WITH ANESTHESIA;  Surgeon: [name], MD;  Location: [place];  Service: Radiology;  Laterality: N/A;   THYROIDECTOMY N/A 02/08/2020   Procedure: THYROIDECTOMY;  Surgeon: [name], MD;  Location: [place];  Service: ENT;  Laterality: N/A;  TRACHEOSTOMY TUBE PLACEMENT N/A 02/08/2020   Procedure: TRACHEOSTOMY;  Surgeon: [name], MD;  Location: [place];  Service: ENT;  Laterality: N/A;   TRACHEOSTOMY TUBE PLACEMENT N/A 02/29/2020   Procedure: TRACHEOSTOMY EXCHANGE;  Surgeon: [name], MD;  Location: [place];  Service: ENT;  Laterality: N/A;   VENTRICULOPERITONEAL SHUNT N/A 03/18/2020   Procedure: RIGHT SHUNT  INSERTION VENTRICULAR-PERITONEAL/ BURR HOLE Evacuation of Subdural Hematoma;  Surgeon: [name], MD;  Location: [place];  Service: Neurosurgery;  Laterality: N/A;    FAMILY HISTORY: No family history on file.  SOCIAL HISTORY:  reports that she has quit smoking. She has never used smokeless tobacco. She reports that she does not currently use alcohol. She reports that she does not currently use drugs.  PERFORMANCE STATUS: The patient's performance status is 3 - Symptomatic, >50% confined to bed  PHYSICAL EXAM: Most Recent Vital Signs: Blood pressure (!) 164/105, pulse 70, temperature 98.8 F (37.1 C), temperature source Oral, resp. rate 17, height '5\' 6"'$  (1.676 m), weight 139 lb 5.3 oz (63.2 kg), SpO2 100 %. BP (!) 164/105    Pulse 70    Temp 98.8 F (37.1 C) (Oral)    Resp 17    Ht '5\' 6"'$  (1.676 m)    Wt 139 lb 5.3 oz (63.2 kg)    SpO2 100%    BMI 22.49 kg/m  General appearance: alert, cooperative, and appears stated age Head: Normocephalic, without obvious abnormality, atraumatic Lungs: clear to auscultation bilaterally Breasts:  3.5 cm left breast mass in the upper outer quadrant, freely mobile. Heart: regular rate and rhythm Extremities:  No edema or cyanosis. Lymph nodes:  Palpable left axillary lymphadenopathy.  No supraclavicular adenopathy. Neurologic: Grossly normal  LABORATORY DATA:  Results for orders placed or performed during the hospital encounter of 06/01/21 (from the past 48 hour(s))  CBC with Differential/Platelet     Status: Abnormal   Collection Time: 06/01/21 10:17 AM  Result Value Ref Range   WBC 6.4 4.0 - 10.5 K/uL   RBC 4.88 3.87 - 5.11 MIL/uL   Hemoglobin 12.8 12.0 - 15.0 g/dL   HCT 41.6 36.0 - 46.0 %   MCV 85.2 80.0 - 100.0 fL   MCH 26.2 26.0 - 34.0 pg   MCHC 30.8 30.0 - 36.0 g/dL   RDW 13.9 11.5 - 15.5 %   Platelets 122 (L) 150 - 400 K/uL   nRBC 0.0 0.0 - 0.2 %   Neutrophils Relative % 33 %   Neutro Abs 2.1 1.7 - 7.7 K/uL   Lymphocytes  Relative 58 %   Lymphs Abs 3.6 0.7 - 4.0 K/uL   Monocytes Relative 8 %   Monocytes Absolute 0.5 0.1 - 1.0 K/uL   Eosinophils Relative 1 %   Eosinophils Absolute 0.1 0.0 - 0.5 K/uL   Basophils Relative 0 %   Basophils Absolute 0.0 0.0 - 0.1 K/uL   Immature Granulocytes 0 %   Abs Immature Granulocytes 0.02 0.00 - 0.07 K/uL    Comment: Performed at [facility], [address] [place], [place] [address]  Comprehensive metabolic panel     Status: Abnormal   Collection Time: 06/01/21 10:17 AM  Result Value Ref Range   Sodium 138 135 - 145 mmol/L   Potassium 3.4 (L) 3.5 - 5.1 mmol/L   Chloride 100 98 - 111 mmol/L   CO2 27 22 - 32 mmol/L   Glucose, Bld 223 (H) 70 - 99 mg/dL    Comment: Glucose reference range applies only  to samples taken after fasting for at least 8 hours.   BUN 23 8 - 23 mg/dL   Creatinine, Ser 0.76 0.44 - 1.00 mg/dL   Calcium 9.4 8.9 - 10.3 mg/dL   Total Protein 7.9 6.5 - 8.1 g/dL   Albumin 3.8 3.5 - 5.0 g/dL   AST 58 (H) 15 - 41 U/L   ALT 54 (H) 0 - 44 U/L   Alkaline Phosphatase 71 38 - 126 U/L   Total Bilirubin 0.2 (L) 0.3 - 1.2 mg/dL   GFR, Estimated >60 >60 mL/min    Comment: (NOTE) Calculated using the CKD-EPI Creatinine Equation (2021)    Anion gap 11 5 - 15    Comment: Performed at [facility], [address], [place], [place] [address]  Troponin I (High Sensitivity)     Status: None   Collection Time: 06/01/21 10:17 AM  Result Value Ref Range   Troponin I (High Sensitivity) 4 <18 ng/L    Comment: (NOTE) Elevated high sensitivity troponin I (hsTnI) values and significant  changes across serial measurements may suggest ACS but many other  chronic and acute conditions are known to elevate hsTnI results.  Refer to the "Links" section for chest pain algorithms and additional  guidance. Performed at [facility], [address] [place], [place] [address]   D-dimer, quantitative     Status: Abnormal   Collection Time: 06/01/21 10:17 AM  Result Value Ref  Range   D-Dimer, Quant 1.35 (H) 0.00 - 0.50 ug/mL-FEU    Comment: (NOTE) At the manufacturer cut-off value of 0.5 g/mL FEU, this assay has a negative predictive value of 95-100%.This assay is intended for use in conjunction with a clinical pretest probability (PTP) assessment model to exclude pulmonary embolism (PE) and deep venous thrombosis (DVT) in outpatients suspected of PE or DVT. Results should be correlated with clinical presentation. Performed at [facility], [address], [place], [place] [address]   Brain natriuretic peptide     Status: None   Collection Time: 06/01/21 10:17 AM  Result Value Ref Range   B Natriuretic Peptide 16.0 0.0 - 100.0 pg/mL    Comment: Performed at [facility], [address] [place], [place] [address]  Troponin I (High Sensitivity)     Status: None   Collection Time: 06/01/21 12:14 PM  Result Value Ref Range   Troponin I (High Sensitivity) 11 <18 ng/L    Comment: (NOTE) Elevated high sensitivity troponin I (hsTnI) values and significant  changes across serial measurements may suggest ACS but many other  chronic and acute conditions are known to elevate hsTnI results.  Refer to the "Links" section for chest pain algorithms and additional  guidance. Performed at [facility], [address] [place], [place] [address]   Magnesium     Status: None   Collection Time: 06/01/21 12:14 PM  Result Value Ref Range   Magnesium 2.2 1.7 - 2.4 mg/dL    Comment: Performed at [facility], [address] [place], [place] [address]  Resp Panel by RT-PCR (Flu A&B, Covid) Nasopharyngeal Swab     Status: None   Collection Time: 06/01/21  8:59 PM   Specimen: Nasopharyngeal Swab; Nasopharyngeal(NP) swabs in vial transport medium  Result Value Ref Range   SARS Coronavirus 2 by RT PCR NEGATIVE NEGATIVE    Comment: (NOTE) SARS-CoV-2 target nucleic acids are NOT DETECTED.  The SARS-CoV-2 RNA is generally detectable in upper respiratory specimens during the acute  phase of infection. The lowest concentration of  SARS-CoV-2 viral copies this assay can detect is 138 copies/mL. A negative result does not preclude SARS-Cov-2 infection and should not be used as the sole basis for treatment or other patient management decisions. A negative result may occur with  improper specimen collection/handling, submission of specimen other than nasopharyngeal swab, presence of viral mutation(s) within the areas targeted by this assay, and inadequate number of viral copies(<138 copies/mL). A negative result must be combined with clinical observations, patient history, and epidemiological information. The expected result is Negative.  Fact Sheet for Patients:  EntrepreneurPulse.com.au  Fact Sheet for Healthcare Providers:  IncredibleEmployment.be  This test is no t yet approved or cleared by the [place] FDA and  has been authorized for detection and/or diagnosis of SARS-CoV-2 by FDA under an Emergency Use Authorization (EUA). This EUA will remain  in effect (meaning this test can be used) for the duration of the COVID-19 declaration under Section 564(b)(1) of the Act, 21 U.S.C.section 360bbb-3(b)(1), unless the authorization is terminated  or revoked sooner.       Influenza A by PCR NEGATIVE NEGATIVE   Influenza B by PCR NEGATIVE NEGATIVE    Comment: (NOTE) The Xpert Xpress SARS-CoV-2/FLU/RSV plus assay is intended as an aid in the diagnosis of influenza from Nasopharyngeal swab specimens and should not be used as a sole basis for treatment. Nasal washings and aspirates are unacceptable for Xpert Xpress SARS-CoV-2/FLU/RSV testing.  Fact Sheet for Patients: EntrepreneurPulse.com.au  Fact Sheet for Healthcare Providers: IncredibleEmployment.be  This test is not yet approved or cleared by the [place] FDA and has been authorized for detection and/or diagnosis of SARS-CoV-2  by FDA under an Emergency Use Authorization (EUA). This EUA will remain in effect (meaning this test can be used) for the duration of the COVID-19 declaration under Section 564(b)(1) of the Act, 21 U.S.C. section 360bbb-3(b)(1), unless the authorization is terminated or revoked.  Performed at [facility], [address], [place], [place] [address]   HIV Antibody (routine testing w rflx)     Status: None   Collection Time: 06/02/21  4:33 AM  Result Value Ref Range   HIV Screen 4th Generation wRfx Non Reactive Non Reactive    Comment: Performed at [facility], [place] [address] [place], [place] [address]  Basic metabolic panel     Status: None   Collection Time: 06/02/21  4:33 AM  Result Value Ref Range   Sodium 143 135 - 145 mmol/L   Potassium 4.0 3.5 - 5.1 mmol/L   Chloride 106 98 - 111 mmol/L   CO2 28 22 - 32 mmol/L   Glucose, Bld 84 70 - 99 mg/dL    Comment: Glucose reference range applies only to samples taken after fasting for at least 8 hours.   BUN 17 8 - 23 mg/dL   Creatinine, Ser 0.55 0.44 - 1.00 mg/dL   Calcium 9.7 8.9 - 10.3 mg/dL   GFR, Estimated >60 >60 mL/min    Comment: (NOTE) Calculated using the CKD-EPI Creatinine Equation (2021)    Anion gap 9 5 - 15    Comment: Performed at [facility], [address] [place], [place] [address]  CBC     Status: Abnormal   Collection Time: 06/02/21  4:33 AM  Result Value Ref Range   WBC 6.1 4.0 - 10.5 K/uL   RBC 5.09 3.87 - 5.11 MIL/uL   Hemoglobin 13.3 12.0 - 15.0 g/dL   HCT 41.8 36.0 - 46.0 %   MCV 82.1 80.0 -  100.0 fL   MCH 26.1 26.0 - 34.0 pg   MCHC 31.8 30.0 - 36.0 g/dL   RDW 13.8 11.5 - 15.5 %   Platelets 141 (L) 150 - 400 K/uL    Comment: SPECIMEN CHECKED FOR CLOTS CONSISTENT WITH PREVIOUS RESULT    nRBC 0.0 0.0 - 0.2 %    Comment: Performed at [facility], [address], [place], [place] [address]  Protime-INR     Status: None   Collection Time: 06/02/21  4:33 AM  Result Value Ref Range   Prothrombin  Time 13.2 11.4 - 15.2 seconds   INR 1.0 0.8 - 1.2    Comment: (NOTE) INR goal varies based on device and disease states. Performed at [facility], [address] [place], [place] [address]       RADIOGRAPHY: CT Head Wo Contrast  Result Date: 06/01/2021 CLINICAL DATA:  66 year old female with history of altered mental status. History of seizures. EXAM: CT HEAD WITHOUT CONTRAST TECHNIQUE: Contiguous axial images were obtained from the base of the skull through the vertex without intravenous contrast. RADIATION DOSE REDUCTION: This exam was performed according to the departmental dose-optimization program which includes automated exposure control, adjustment of the mA and/or kV according to patient size and/or use of iterative reconstruction technique. COMPARISON:  Head CT 11/10/2020. FINDINGS: Brain: Previously noted chronic extra-axial fluid collections have decreased in size, compatible with resolving bilateral chronic subdural hematomas/hygromas. Right frontal ventriculostomy shunt catheter with tip terminating in the anterior aspect of the right lateral ventricle. Ventricles appear slightly dilated compared to the prior examination, suggesting potential shunt dysfunction. Patchy and confluent areas of decreased attenuation are noted throughout the deep and periventricular white matter of the cerebral hemispheres bilaterally, compatible with chronic microvascular ischemic disease. No evidence of acute infarction, hemorrhage, or mass lesion/mass effect. Vascular: Atherosclerotic calcifications in the cerebral vasculature. Skull: Status post left suboccipital craniotomy. Sinuses/Orbits: No acute finding. Other: None. IMPRESSION: 1. Interval development of mild hydrocephalus. Clinical correlation for evidence of ventriculostomy shunt catheter is function is recommended. 2. Decreasing size of small chronic bilateral subdural hematomas/hygromas. 3. Mild chronic microvascular ischemic changes in the cerebral  white matter, as above. Electronically Signed   By: Vinnie Langton M.D.   On: 06/01/2021 10:59   CT Angio Chest PE W and/or Wo Contrast  Result Date: 06/01/2021 CLINICAL DATA:  Elevated D-dimer.  Possible pulmonary embolism. EXAM: CT ANGIOGRAPHY CHEST WITH CONTRAST TECHNIQUE: Multidetector CT imaging of the chest was performed using the standard protocol during bolus administration of intravenous contrast. Multiplanar CT image reconstructions and MIPs were obtained to evaluate the vascular anatomy. RADIATION DOSE REDUCTION: This exam was performed according to the departmental dose-optimization program which includes automated exposure control, adjustment of the mA and/or kV according to patient size and/or use of iterative reconstruction technique. CONTRAST:  63m OMNIPAQUE IOHEXOL 350 MG/ML SOLN COMPARISON:  Chest x-ray 06/02/2018 FINDINGS: Cardiovascular: Heart is normal size. Bovine arch morphology. Thoracic aorta is normal in caliber without evidence of aneurysm. Subtle calcified plaque over the left anterior descending coronary artery. Pulmonary arterial system is well opacified and demonstrates no evidence of emboli. Remaining vasculature is unremarkable. Mediastinum/Nodes: Tracheostomy tube in adequate position. No evidence of mediastinal or hilar adenopathy. Well-defined oval cystic mass over the middle mediastinum abutting the left posterolateral aspect of the esophagus measuring approximately 1.8 x 1.8 x 6 cm in AP, transverse and craniocaudal dimensions. 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 Remaining mediastinal structures  are unremarkable. Lungs/Pleura: Lungs are adequately inflated with mild emphysematous disease over the mid to upper lungs. Minimal posterior dependent atelectasis left worse than right. No acute airspace process or effusion. Airways are normal. Upper Abdomen: 1.7 cm hypodensity over the upper central aspect of the  right lobe of the liver. No acute findings. Musculoskeletal: There is a 3.3 cm mass over the outer midportion of the left breast with associated skin thickening. There is left axillary and retropectoral adenopathy. No focal bony abnormality. Review of the MIP images confirms the above findings. IMPRESSION: 1. No evidence of acute pulmonary embolism. No acute cardiopulmonary disease. 2. 3.3 cm mass over the outer midportion of the left breast with associated skin thickening and associated left axillary and retropectoral adenopathy. Findings are concerning for primary breast carcinoma. Recommend further evaluation with diagnostic mammography and ultrasound. 3. 6 cm well-defined cystic mass over the middle mediastinum abutting the left posterolateral aspect of the esophagus. 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 4. Emphysema. Minimal atherosclerotic calcification over the left anterior descending coronary artery. 5. 1.7 cm hypodensity over the upper central aspect of the right lobe of the liver. In light of the above findings, this may represent a metastatic focus. Recommend CT abdomen/pelvis with intravenous contrast. Emphysema (ICD10-J43.9). These results were called by telephone at the time of interpretation on 06/01/2021 at 1:09 pm to provider [facility] ZAMMIT , who verbally acknowledged these results. Electronically Signed   By: Marin Olp M.D.   On: 06/01/2021 13:09   DG Chest Port 1 View  Result Date: 06/01/2021 CLINICAL DATA:  Altered mental status EXAM: PORTABLE CHEST 1 VIEW COMPARISON:  05/24/2021 FINDINGS: Transverse diameter of heart is increased. Tip of tracheostomy tube is 6.9 cm above the carina. Ventriculoperitoneal shunt is seen on the right side. There are no signs of alveolar pulmonary edema or focal pulmonary consolidation. There are linear densities in the medial left lower lung fields. There is no pleural effusion or pneumothorax. IMPRESSION: Linear densities in the medial left lower  lung fields suggest subsegmental atelectasis. There are no signs of pulmonary edema or focal pulmonary consolidation. Electronically Signed   By: Elmer Picker M.D.   On: 06/01/2021 10:40        ASSESSMENT and PLAN:  1.  Left breast mass with axillary and subpectoral adenopathy: - I have reviewed the findings on the CT scan with the patient in detail.  3.3 cm mass in the upper outer left breast with associated left axillary and retropectoral adenopathy.  There is also questionable 1.7 cm hypodensity over the upper central aspect of the right lobe of the liver. - Patient reports family history of breast cancer (daughter). - We will arrange for outpatient mammogram/ultrasound guided biopsy after discharge. - We will also obtain PET scan for accurate staging as an outpatient. - Discussed with Dr. Roger Shelter.  2.  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  3.  Cardiac arrest: - Apparently patient was found pulseless at the facility and had undergone CPR.  EMS reported strong carotid pulses upon arrival.  She was monitored with serial cardiac enzymes which were negative.  All questions were answered. The patient knows to call the clinic with any problems, questions or concerns. We can certainly see the patient much sooner if necessary.    Derek Jack

## 2021-06-02 NOTE — Assessment & Plan Note (Signed)
Stable

## 2021-06-02 NOTE — Assessment & Plan Note (Signed)
-   Follow-up with Dr. Delton Coombes as an outpatient ?-Arrangement has been made for outpatient mammography, ultrasound guided biopsy and PET scan ?

## 2021-06-02 NOTE — Progress Notes (Signed)
Order placed for diagnostic mammo and PET scan per Dr. Delton Coombes ?

## 2021-06-02 NOTE — Progress Notes (Signed)
Report has been called to [facility].  ?

## 2021-06-02 NOTE — [place] FL2 (Signed)
?[place] MEDICAID FL2 LEVEL OF CARE SCREENING TOOL  ?  ? ?IDENTIFICATION  ?Patient Name: ?Stacy Moran Birthdate: 05/31/1955 Sex: female Admission Date (Current Location): ?06/01/2021  ?[place] and [place] Number: [facility] ?[id number] [place] and Address:  ?[facility] [address] [place] ?     Provider Number: ?[id number]  ?Attending Physician Name and Address:  ?Deatra James, MD ? Relative Name and Phone Number:  ?[name] (Daughter)   [phone] ?   ?Current Level of Care: ?Hospital Recommended Level of Care: ?[place] Prior Approval Number: ?  ? ?Date Approved/Denied: ?  PASRR Number: ?  ? ?Discharge Plan: ?SNF ?  ? ?Current Diagnoses: ?Patient Active Problem List  ? Diagnosis Date Noted  ? Mass of upper outer quadrant of left breast   ? Altered mental status 06/01/2021  ? Cardiac arrest ([place]) 06/01/2021  ? Abnormal CT of the chest 06/01/2021  ? Bilateral vocal cord paralysis 01/14/2021  ? Fall (on) (from) other stairs and steps, initial encounter 08/26/2020  ? Subdural hematoma 07/31/2020  ? History of hemorrhagic cerebrovascular accident (CVA) with residual deficit 06/06/2020  ? 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  ? S/P ventriculoperitoneal shunt 06/06/2020  ? Functional urinary incontinence 06/06/2020  ? Ineffective airway clearance   ? Restlessness   ? Restlessness and agitation   ? Acute blood loss anemia   ? Essential hypertension   ? Seizure prophylaxis   ? Protein-calorie malnutrition, severe 04/24/2020  ? Dysphagia, post-stroke   ? Dysphagia   ? Status post tracheostomy ([place])   ? S/P percutaneous endoscopic gastrostomy (PEG) tube placement ([place])   ? ICH (intracerebral hemorrhage) ([place]) 04/22/2020  ? Obstructive hydrocephalus ([place])   ? Seizure-like activity ([place]) 03/12/2020  ? PEG (percutaneous endoscopic gastrostomy) status ([place]) 03/08/2020  ? Hyperglycemia 03/08/2020  ? Acute respiratory failure ([place])   ? Ventilator dependence ([place])    ? 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/19/2020  ? 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/19/2020  ? Tracheostomy status ([place]) 02/19/2020  ? 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 SAH (subarachnoid hemorrhage) ([place])   ? Prediabetes   ? Hypokalemia   ? Pressure injury of skin 02/13/2020  ? 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 ? ?Orientation RESPIRATION BLADDER Height & Weight   ?  ?Self, Time, Situation, Place ? Other (Comment) (trach) Incontinent Weight: 139 lb 5.3 oz (63.2 kg) ?Height:  '5\' 6"'$  (167.6 cm)  ?BEHAVIORAL SYMPTOMS/MOOD NEUROLOGICAL BOWEL NUTRITION STATUS  ?    Incontinent Feeding tube  ?AMBULATORY STATUS COMMUNICATION OF NEEDS Skin   ?Limited Assist Verbally Normal ?  ?  ?  ?    ?     ?     ? ? ?Personal Care Assistance Level of Assistance  ?Bathing, Feeding, Dressing Bathing Assistance: Limited assistance ?Feeding assistance: Maximum assistance (feeding tube) ?Dressing Assistance: Limited assistance ?   ? ?Functional Limitations Info  ?Sight, Speech, Hearing Sight Info: Adequate ?Hearing Info: Adequate ?Speech Info: Adequate  ? ? ?SPECIAL CARE FACTORS FREQUENCY  ?    ?  ?  ?  ?  ?  ?  ?   ? ? ?Contractures Contractures Info: Not present  ? ? ?Additional Factors Info  ?Code Status, Allergies, Psychotropic Code Status Info: Full Code ?Allergies Info: NKA ?Psychotropic Info: Xanax, Cymbalta ?  ?  ?   ? ?Current Medications (06/02/2021):  This is the current hospital active medication list ?Current Facility-Administered Medications  ?Medication Dose Route Frequency Provider Last Rate  Last Admin  ? 0.9 %  sodium chloride infusion  250 mL Intravenous PRN Shahmehdi, Seyed A, MD      ? acetaminophen (TYLENOL) tablet 650 mg  650 mg Per Tube Q6H PRN Shahmehdi, Seyed A, MD      ? ALPRAZolam Duanne Moron) tablet 0.5 mg  0.5 mg Oral TID PRN Skipper Cliche A, MD   0.5 mg at 06/02/21 1025  ? amLODipine (NORVASC) tablet 5 mg  5 mg Per Tube Daily Shahmehdi, Seyed A, MD   5 mg at 06/02/21 0955  ? Chlorhexidine  Gluconate Cloth 2 % PADS 6 each  6 each Topical Q0600 Deatra James, MD   6 each at 06/01/21 2357  ? DULoxetine (CYMBALTA) DR capsule 20 mg  20 mg Oral Daily Shahmehdi, Seyed A, MD   20 mg at 06/02/21 1000  ? feeding supplement (OSMOLITE 1.2 CAL) liquid 355 mL  355 mL Per Tube 5 X Daily Shahmehdi, Seyed A, MD   355 mL at 06/02/21 0956  ? fluticasone (FLONASE) 50 MCG/ACT nasal spray 1 spray  1 spray Each Nare BID Skipper Cliche A, MD   1 spray at 06/02/21 0957  ? free water 150 mL  150 mL Per Tube Q6H Shahmehdi, Seyed A, MD   150 mL at 06/02/21 8527  ? glycopyrrolate (ROBINUL) tablet 1 mg  1 mg Per Tube BID Skipper Cliche A, MD   1 mg at 06/02/21 7824  ? hydrALAZINE (APRESOLINE) injection 10 mg  10 mg Intravenous Q4H PRN Shahmehdi, Seyed A, MD      ? HYDROmorphone (DILAUDID) injection 0.5-1 mg  0.5-1 mg Intravenous Q2H PRN Shahmehdi, Seyed A, MD      ? ipratropium (ATROVENT) nebulizer solution 0.5 mg  0.5 mg Nebulization Q6H PRN Skipper Cliche A, MD   0.5 mg at 06/02/21 0437  ? levalbuterol (XOPENEX) nebulizer solution 0.63 mg  0.63 mg Nebulization Q6H PRN Skipper Cliche A, MD   0.63 mg at 06/02/21 2353  ? levETIRAcetam (KEPPRA) 100 MG/ML solution 750 mg  750 mg Per Tube Q12H Shahmehdi, Seyed A, MD   750 mg at 06/02/21 6144  ? levothyroxine (SYNTHROID) tablet 100 mcg  100 mcg Per Tube R1540 Skipper Cliche A, MD   100 mcg at 06/02/21 0622  ? ondansetron (ZOFRAN) tablet 4 mg  4 mg Oral Q6H PRN Shahmehdi, Seyed A, MD      ? Or  ? ondansetron (ZOFRAN) injection 4 mg  4 mg Intravenous Q6H PRN Shahmehdi, Seyed A, MD      ? oxyCODONE (Oxy IR/ROXICODONE) immediate release tablet 5 mg  5 mg Oral Q4H PRN Shahmehdi, Seyed A, MD      ? polyethylene glycol (MIRALAX / GLYCOLAX) packet 17 g  17 g Per Tube BID Skipper Cliche A, MD   17 g at 06/02/21 0956  ? QUEtiapine (SEROQUEL) tablet 50 mg  50 mg Per Tube BID Skipper Cliche A, MD   50 mg at 06/02/21 0955  ? senna (SENOKOT) tablet 8.6 mg  1 tablet Per Tube Daily  Shahmehdi, Seyed A, MD   8.6 mg at 06/02/21 0957  ? senna-docusate (Senokot-S) tablet 1 tablet  1 tablet Oral QHS PRN Shahmehdi, Seyed A, MD      ? sodium chloride flush (NS) 0.9 % injection 3 mL  3 mL Intravenous Q12H Shahmehdi, Seyed A, MD   3 mL at 06/01/21 1541  ? sodium chloride flush (NS) 0.9 % injection 3 mL  3 mL Intravenous Q12H Shahmehdi, Seyed  A, MD      ? sodium chloride flush (NS) 0.9 % injection 3 mL  3 mL Intravenous PRN Shahmehdi, Seyed A, MD      ? traZODone (DESYREL) tablet 25 mg  25 mg Oral QHS PRN Deatra James, MD      ? ? ? ?Discharge Medications: ?Please see discharge summary for a list of discharge medications. ? ?Relevant Imaging Results: ? ?Relevant Lab Results: ? ? ?Additional Information ?SS#: [id number] ? ?Charley Miske D, LCSW ? ? ? ? ?

## 2021-06-02 NOTE — TOC Transition Note (Signed)
Transition of Care (TOC) - CM/SW Discharge Note ? ? ?Patient Details  ?Name: Stacy Moran ?MRN: [id number] ?Date of Birth: Dec 16, 1955 ? ?Transition of Care (TOC) CM/SW Contact:  ?Yesly Gerety D, LCSW ?Phone Number: ?06/02/2021, 12:46 PM ? ? ?Clinical Narrative:    ?Patient is a LTC resident at [facility]. Discharge clinicals sent to facility. Daughter notified of discharge. EMS to transport. RN called report. TOC signing off.  ? ? ?Final next level of care: [facility] ?Barriers to Discharge: No Barriers Identified ? ? ?Patient Goals and CMS Choice ?  ?  ?  ? ?Discharge Placement ?  ?           ?Patient chooses bed at: Other - please specify in the comment section below: ([facility]) ?Patient to be transferred to facility by: [facility] ?Name of family member notified: daughter, [name] ?Patient and family notified of of transfer: 06/02/21 ? ?Discharge Plan and Services ?  ?  ?           ?  ?  ?  ?  ?  ?  ?  ?  ?  ?  ? ?Social Determinants of Health (SDOH) Interventions ?  ? ? ?Readmission Risk Interventions ?Readmission Risk Prevention Plan 12/31/2020  ?Transportation Screening Complete  ?Medication Review [employer]) Complete  ?PCP or Specialist appointment within 3-5 days of discharge Complete  ?[place] or Home Care Consult Complete  ?SW Recovery Care/Counseling Consult Complete  ?Palliative Care Screening Not Applicable  ?Skilled Nursing Facility Complete  ? ? ? ? ? ?

## 2021-06-02 NOTE — Discharge Summary (Signed)
Physician Discharge Summary   Patient: Stacy Moran MRN: [id number] DOB: [birth year]  Admit date:     06/01/2021  Discharge date: 06/02/21  Discharge Physician: Deatra James   PCP: Everlena Cooper, NP   Recommendations at discharge:   Follow-up with Dr. 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 *  Hospital Course: Stacy Moran is a 66 which resounding a currently a nursing facility with extensive history of CVA, HTN, seizures, extensive history of subdural hematoma, ruptured aneurysm, with a chronic tracheostomy, and gastrostomy presented today after patient had a sudden fall, was found pulseless, CPR was initiated approximately 2 minutes when EMS arrived strong carotid pulse was identified, assisted ventilation was initiated, CBG was 226, patient brought to the ED for further evaluation  No recent history of illnesses Evaluation in ED on 05/24/2021 for tracheostomy reevaluation, obstruction, and clearing..    ED: Blood pressure (!) 174/99, pulse 77, temperature 97.6 F (36.4 C), temperature source Oral, resp. rate (!) 22, height '5\' 6"'$  (1.676 m), weight 61.6 kg, SpO2 99 %.  LABs: CBC CMP reviewed within normal limits potassium 3.4, mildly elevated LFTs, AST  58, ALT 54, T bilirubin 0.3, glucose 110, 223 Troponin 4, 11, elevated D-dimer 1.3,  CTA : No pulmonary embolism, nonspecific finding, new metastatic breast cancer   CT head: IMPRESSION: 1. Interval development of mild hydrocephalus. Clinical correlation for evidence of ventriculostomy shunt catheter is function is recommended. 2. Decreasing size of small chronic bilateral subdural hematomas/hygromas. 3. Mild chronic microvascular ischemic changes  Assessment and Plan: * Cardiac arrest ([place]) - Apparently patient was found pulseless at the facility residing in -Status post approximately 2 minutes of CPR at the facility -EMS reported they found strong carotid pulse upon arrival -We will admit to telemetry floor -stepdown under close observation -Continue checking vitals including neurochecks every 4 hours - stable  troponins x2 negative, no specific findings on EKG -Labs within normal limits with exception of mild transaminitis, mild hypokalemia -Denies any chest pain, or any other acute changes daughter present at bedside confirms -No events overnight  Mass of upper outer quadrant of left breast - Follow-up with Dr. Delton Coombes as an outpatient -Arrangement has been made for outpatient mammography, ultrasound guided biopsy and PET scan  Abnormal CT of the chest -- CTA chest was obtained, radiologist called, negative for acute pulmonary embolism -- MPRESSION: 1. No evidence of acute pulmonary embolism. No acute cardiopulmonary disease. 2. 3.3 cm mass over the outer midportion of the left breast with associated skin thickening and associated left axillary and retropectoral adenopathy. Findings are concerning for primary breast carcinoma. Recommend further evaluation with diagnostic mammography and ultrasound. 3. 6 cm well-defined cystic mass over the middle mediastinum abutting the left posterolateral aspect of the esophagus. 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 4.  Emphysema. Minimal atherosclerotic calcification over the left anterior descending coronary artery. 5. 1.7 cm hypodensity over the upper central aspect of the right lobe of the liver. In light of the above findings, this may represent a metastatic focus. Recommend CT abdomen/pelvis with intravenous contrast.    -The findings were discussed in detail with patient's daughter at bedside -Oncologist Dr. Delton Coombes was consulted, arrangement has been made for outpatient mammogram/ultrasound guided biopsy of the left breast mass, and PET scan.  Altered mental status - Patient at baseline, -Awake alert oriented, difficult verbal communication due to tracheostomy  Bilateral vocal cord paralysis Stable  Postoperative hypothyroidism - Continue Synthroid  Seizure prophylaxis - On Keppra will be continued -Seizure precautions  Essential hypertension - Currently stable -Continue home medication of Norvasc -As needed IV hydralazine  Dysphagia, post-stroke - PEG tube in place -Tracheostomy in place -Continuing with tube feeds   Seizure-like activity ([facility]) - Continue Keppra 750 mg twice daily per PEG tube -1 g IV was given -No reported history of seizure activities  PEG (percutaneous endoscopic gastrostomy) status ([place]) - Flushing adequately, continue to present meds per PEG tube  Tracheostomy status ([facility]) - Stable functional  H/O total thyroidectomy - O2 via tracheostomy, currently functional -In no respiratory distress -Continue O2 supplements  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 Delton Coombes Procedures performed: CT angiogram Disposition: Skilled  nursing facility Diet recommendation:  Discharge Diet Orders (From admission, onward)     Start     Ordered   06/02/21 0000  Diet - low sodium heart healthy        06/02/21 1107           Continue tube feeds per instructions   DISCHARGE MEDICATION: Allergies as of 06/02/2021   No Known Allergies      Medication List     STOP taking these medications    docusate 50 MG/5ML liquid Commonly known as: COLACE       TAKE these medications    acetaminophen 325 MG tablet Commonly known as: TYLENOL Place 2 tablets (650 mg total) into feeding tube every 6 (six) hours as needed for mild pain.   ALPRAZolam 0.5 MG tablet Commonly known as: XANAX Take 0.5 mg by mouth 3 (three) times daily as needed.   amLODipine 5 MG tablet Commonly known as: NORVASC Place 1 tablet (5 mg total) into feeding tube daily.   chlorhexidine 0.12 % solution Commonly known as: PERIDEX 15 mLs by Mouth Rinse route 2 (two) times daily.   DULoxetine 20 MG capsule Commonly known as: CYMBALTA Take 20 mg by mouth daily.   feeding supplement (OSMOLITE 1.2 CAL) Liqd Place 355 mLs into feeding tube 5 (five) times daily.   fluticasone 50 MCG/ACT nasal spray Commonly known as: FLONASE Place 1 spray into both nostrils 2 (two) times daily.   free water Soln Place 150 mLs into feeding tube every 6 (six) hours.   glycopyrrolate 1 MG tablet Commonly known as: ROBINUL Take 1 tablet (1 mg total) by mouth 2 (two) times daily. What changed: how to take this   guaiFENesin 100 MG/5ML liquid Commonly known as: ROBITUSSIN Place 10 mLs into feeding tube every 8 (eight) hours.  ipratropium-albuterol 0.5-2.5 (3) MG/3ML Soln Commonly known as: DUONEB Take 3 mLs by nebulization every 4 (four) hours as needed.   levETIRAcetam 100 MG/ML solution Commonly known as: KEPPRA Place 7.5 mLs (750 mg total) into feeding tube 2 (two) times daily.   levothyroxine 100 MCG tablet Commonly known as: SYNTHROID Place 1  tablet (100 mcg total) into feeding tube daily at 6 (six) AM.   polyethylene glycol 17 g packet Commonly known as: MIRALAX / GLYCOLAX Place 17 g into feeding tube 2 (two) times daily.   QUEtiapine 50 MG tablet Commonly known as: SEROQUEL Place 1 tablet (50 mg total) into feeding tube 2 (two) times daily.   senna 8.6 MG Tabs tablet Commonly known as: SENOKOT Place 1 tablet (8.6 mg total) into feeding tube daily.        Discharge Exam: Filed Weights   06/01/21 1019 06/02/21 0500  Weight: 61.6 kg 63.2 kg     Physical Exam:   General:  AAO x 3,  cooperative, no distress;   HEENT:  Tracheostomy at the neck area, functional, oxygen via trach  Normocephalic, PERRL, otherwise with in Normal limits   Neuro:  CNII-XII intact. , normal motor and sensation, reflexes intact   Lungs:   Clear to auscultation BL, Respirations unlabored,  No wheezes / crackles  Cardio:    S1/S2, RRR, No murmure, No Rubs or Gallops   Abdomen:  PEG tube in place, functional  soft, non-tender, bowel sounds active all four quadrants, no guarding or peritoneal signs.  Muscular  skeletal:  Limited exam -global generalized weaknesses - in bed, able to move all 4 extremities,   2+ pulses,  symmetric, No pitting edema  Skin:  Dry, warm to touch, negative for any Rashes,  Wounds: Please see nursing documentation          Condition at discharge: stable  The results of significant diagnostics from this hospitalization (including imaging, microbiology, ancillary and laboratory) are listed below for reference.   Imaging Studies: CT Head Wo Contrast  Result Date: 06/01/2021 CLINICAL DATA:  66 year old female with history of altered mental status. History of seizures. EXAM: CT HEAD WITHOUT CONTRAST TECHNIQUE: Contiguous axial images were obtained from the base of the skull through the vertex without intravenous contrast. RADIATION DOSE REDUCTION: This exam was performed according to the departmental  dose-optimization program which includes automated exposure control, adjustment of the mA and/or kV according to patient size and/or use of iterative reconstruction technique. COMPARISON:  Head CT 11/10/2020. FINDINGS: Brain: Previously noted chronic extra-axial fluid collections have decreased in size, compatible with resolving bilateral chronic subdural hematomas/hygromas. Right frontal ventriculostomy shunt catheter with tip terminating in the anterior aspect of the right lateral ventricle. Ventricles appear slightly dilated compared to the prior examination, suggesting potential shunt dysfunction. Patchy and confluent areas of decreased attenuation are noted throughout the deep and periventricular white matter of the cerebral hemispheres bilaterally, compatible with chronic microvascular ischemic disease. No evidence of acute infarction, hemorrhage, or mass lesion/mass effect. Vascular: Atherosclerotic calcifications in the cerebral vasculature. Skull: Status post left suboccipital craniotomy. Sinuses/Orbits: No acute finding. Other: None. IMPRESSION: 1. Interval development of mild hydrocephalus. Clinical correlation for evidence of ventriculostomy shunt catheter is function is recommended. 2. Decreasing size of small chronic bilateral subdural hematomas/hygromas. 3. Mild chronic microvascular ischemic changes in the cerebral white matter, as above. Electronically Signed   By: Vinnie Langton M.D.   On: 06/01/2021 10:59   CT Angio Chest PE W and/or Wo Contrast  Result  Date: 06/01/2021 CLINICAL DATA:  Elevated D-dimer.  Possible pulmonary embolism. EXAM: CT ANGIOGRAPHY CHEST WITH CONTRAST TECHNIQUE: Multidetector CT imaging of the chest was performed using the standard protocol during bolus administration of intravenous contrast. Multiplanar CT image reconstructions and MIPs were obtained to evaluate the vascular anatomy. RADIATION DOSE REDUCTION: This exam was performed according to the departmental  dose-optimization program which includes automated exposure control, adjustment of the mA and/or kV according to patient size and/or use of iterative reconstruction technique. CONTRAST:  27m OMNIPAQUE IOHEXOL 350 MG/ML SOLN COMPARISON:  Chest x-ray 06/02/2018 FINDINGS: Cardiovascular: Heart is normal size. Bovine arch morphology. Thoracic aorta is normal in caliber without evidence of aneurysm. Subtle calcified plaque over the left anterior descending coronary artery. Pulmonary arterial system is well opacified and demonstrates no evidence of emboli. Remaining vasculature is unremarkable. Mediastinum/Nodes: Tracheostomy tube in adequate position. No evidence of mediastinal or hilar adenopathy. Well-defined oval cystic mass over the middle mediastinum abutting the left posterolateral aspect of the esophagus measuring approximately 1.8 x 1.8 x 6 cm in AP, transverse and craniocaudal dimensions. 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 Remaining mediastinal structures are unremarkable. Lungs/Pleura: Lungs are adequately inflated with mild emphysematous disease over the mid to upper lungs. Minimal posterior dependent atelectasis left worse than right. No acute airspace process or effusion. Airways are normal. Upper Abdomen: 1.7 cm hypodensity over the upper central aspect of the right lobe of the liver. No acute findings. Musculoskeletal: There is a 3.3 cm mass over the outer midportion of the left breast with associated skin thickening. There is left axillary and retropectoral adenopathy. No focal bony abnormality. Review of the MIP images confirms the above findings. IMPRESSION: 1. No evidence of acute pulmonary embolism. No acute cardiopulmonary disease. 2. 3.3 cm mass over the outer midportion of the left breast with associated skin thickening and associated left axillary and retropectoral adenopathy. Findings are concerning for primary breast carcinoma.  Recommend further evaluation with diagnostic mammography and ultrasound. 3. 6 cm well-defined cystic mass over the middle mediastinum abutting the left posterolateral aspect of the esophagus. 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 4. Emphysema. Minimal atherosclerotic calcification over the left anterior descending coronary artery. 5. 1.7 cm hypodensity over the upper central aspect of the right lobe of the liver. In light of the above findings, this may represent a metastatic focus. Recommend CT abdomen/pelvis with intravenous contrast. Emphysema (ICD10-J43.9). These results were called by telephone at the time of interpretation on 06/01/2021 at 1:09 pm to provider [facility] , who verbally acknowledged these results. Electronically Signed   By: DMarin OlpM.D.   On: 06/01/2021 13:09   DG Chest Port 1 View  Result Date: 06/01/2021 CLINICAL DATA:  Altered mental status EXAM: PORTABLE CHEST 1 VIEW COMPARISON:  05/24/2021 FINDINGS: Transverse diameter of heart is increased. Tip of tracheostomy tube is 6.9 cm above the carina. Ventriculoperitoneal shunt is seen on the right side. There are no signs of alveolar pulmonary edema or focal pulmonary consolidation. There are linear densities in the medial left lower lung fields. There is no pleural effusion or pneumothorax. IMPRESSION: Linear densities in the medial left lower lung fields suggest subsegmental atelectasis. There are no signs of pulmonary edema or focal pulmonary consolidation. Electronically Signed   By: PElmer PickerM.D.   On: 06/01/2021 10:40   DG Chest Portable 1 View  Result Date: 05/24/2021 CLINICAL DATA:  Congestion. EXAM: PORTABLE CHEST 1 VIEW COMPARISON:  Chest radiograph dated 01/19/2021. FINDINGS: Tracheostomy above the carina. No focal consolidation, pleural effusion, pneumothorax the cardiac silhouette is within limits. Atherosclerotic calcification of the aorta. No acute osseous pathology. Partially visualized VP  shunt over the right chest. The visualized portion of the shunt appears intact. IMPRESSION: No active cardiopulmonary disease. Electronically Signed   By: Anner Crete M.D.   On: 05/24/2021 19:37    Microbiology: Results for orders placed or performed during the hospital encounter of 06/01/21  Resp Panel by RT-PCR (Flu A&B, Covid) Nasopharyngeal Swab     Status: None   Collection Time: 06/01/21  8:59 PM   Specimen: Nasopharyngeal Swab; Nasopharyngeal(NP) swabs in vial transport medium  Result Value Ref Range Status   SARS Coronavirus 2 by RT PCR NEGATIVE NEGATIVE Final    Comment: (NOTE) SARS-CoV-2 target nucleic acids are NOT DETECTED.  The SARS-CoV-2 RNA is generally detectable in upper respiratory specimens during the acute phase of infection. The lowest concentration of SARS-CoV-2 viral copies this assay can detect is 138 copies/mL. A negative result does not preclude SARS-Cov-2 infection and should not be used as the sole basis for treatment or other patient management decisions. A negative result may occur with  improper specimen collection/handling, submission of specimen other than nasopharyngeal swab, presence of viral mutation(s) within the areas targeted by this assay, and inadequate number of viral copies(<138 copies/mL). A negative result must be combined with clinical observations, patient history, and epidemiological information. The expected result is Negative.  Fact Sheet for Patients:  EntrepreneurPulse.com.au  Fact Sheet for Healthcare Providers:  IncredibleEmployment.be  This test is no t yet approved or cleared by the [place] FDA and  has been authorized for detection and/or diagnosis of SARS-CoV-2 by FDA under an Emergency Use Authorization (EUA). This EUA will remain  in effect (meaning this test can be used) for the duration of the COVID-19 declaration under Section 564(b)(1) of the Act, 21 U.S.C.section  360bbb-3(b)(1), unless the authorization is terminated  or revoked sooner.       Influenza A by PCR NEGATIVE NEGATIVE Final   Influenza B by PCR NEGATIVE NEGATIVE Final    Comment: (NOTE) The Xpert Xpress SARS-CoV-2/FLU/RSV plus assay is intended as an aid in the diagnosis of influenza from Nasopharyngeal swab specimens and should not be used as a sole basis for treatment. Nasal washings and aspirates are unacceptable for Xpert Xpress SARS-CoV-2/FLU/RSV testing.  Fact Sheet for Patients: EntrepreneurPulse.com.au  Fact Sheet for Healthcare Providers: IncredibleEmployment.be  This test is not yet approved or cleared by the [place] FDA and has been authorized for detection and/or diagnosis of SARS-CoV-2 by FDA under an Emergency Use Authorization (EUA). This EUA will remain in effect (meaning this test can be used) for the duration of the COVID-19 declaration under Section 564(b)(1) of the Act, 21 U.S.C. section 360bbb-3(b)(1), unless the authorization is terminated or revoked.  Performed at [facility], [address], [place], [place] [address]     Labs: CBC: Recent Labs  Lab 06/01/21 1017 06/02/21 0433  WBC 6.4 6.1  NEUTROABS 2.1  --   HGB 12.8 13.3  HCT 41.6 41.8  MCV 85.2 82.1  PLT 122* 400*   Basic Metabolic Panel: Recent Labs  Lab 06/01/21 1017 06/01/21 1214 06/02/21 0433  NA 138  --  143  K 3.4*  --  4.0  CL 100  --  106  CO2 27  --  28  GLUCOSE 223*  --  84  BUN 23  --  17  CREATININE 0.76  --  0.55  CALCIUM 9.4  --  9.7  MG  --  2.2  --    Liver Function Tests: Recent Labs  Lab 06/01/21 1017  AST 58*  ALT 54*  ALKPHOS 71  BILITOT 0.2*  PROT 7.9  ALBUMIN 3.8   CBG: No results for input(s): GLUCAP in the last 168 hours.  Discharge time spent: greater than 45 minutes.  Signed: Deatra James, MD [facility] 06/02/2021

## 2021-06-09 ENCOUNTER — Ambulatory Visit (HOSPITAL_COMMUNITY)
Admission: RE | Admit: 2021-06-09 | Discharge: 2021-06-09 | Disposition: A | Discharge status: Home / Self Care | Payer: Medicaid Other | Source: Ambulatory Visit | Attending: Hematology | Admitting: Hematology

## 2021-06-09 ENCOUNTER — Other Ambulatory Visit: Payer: Self-pay

## 2021-06-09 ENCOUNTER — Other Ambulatory Visit (HOSPITAL_COMMUNITY): Payer: Self-pay | Admitting: Hematology

## 2021-06-09 DIAGNOSIS — N6320 Unspecified lump in the left breast, unspecified quadrant: Secondary | ICD-10-CM | POA: Diagnosis not present

## 2021-06-09 DIAGNOSIS — R928 Other abnormal and inconclusive findings on diagnostic imaging of breast: Secondary | ICD-10-CM | POA: Insufficient documentation

## 2021-06-09 DIAGNOSIS — Z1239 Encounter for other screening for malignant neoplasm of breast: Secondary | ICD-10-CM | POA: Diagnosis not present

## 2021-06-09 IMAGING — US US BREAST*L* LIMITED INC AXILLA
1 series · 13 of 15 positions shown · non-contrast
Comparison: None.

CLINICAL DATA: 65-year-old female with palpable area of concern in
the left breast and recent chest CT dated [DATE] demonstrating a
large at least 3 cm mass with associated skin thickening involving
the left breast as well as bulky left axillary and retropectoral
lymphadenopathy.
TECHNIQUE: Bilateral digital diagnostic mammography and breast tomosynthesis
was performed. The images were evaluated with computer-aided
detection.; Targeted ultrasound examination of the left breast was
performed.

[Series 1: us breast*left* limited inc axilla · 0.07mm/px · 13 of 15 slices shown]
[im 1/15]
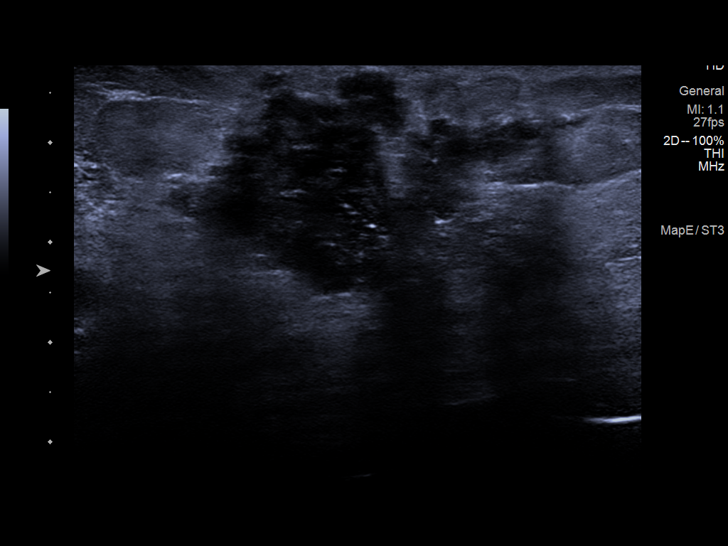
[im 2/15]
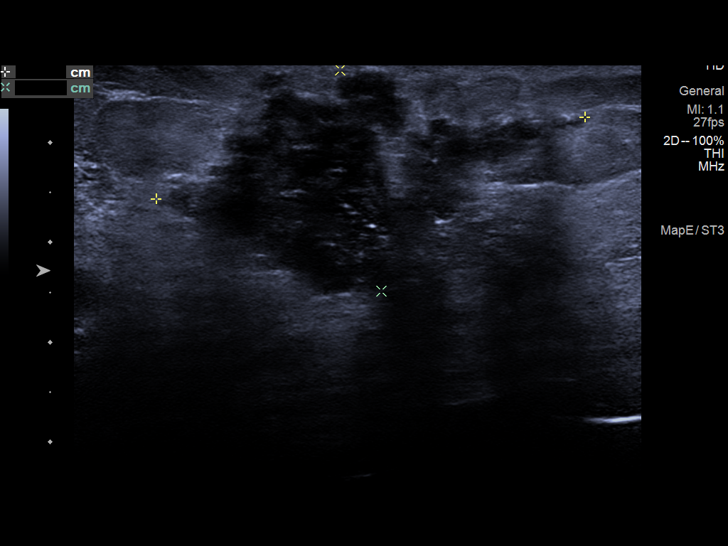
[im 3/15]
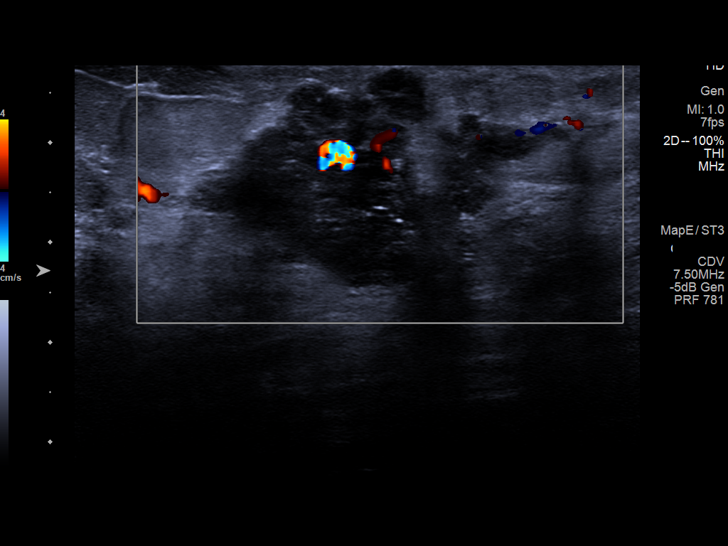
[im 5/15]
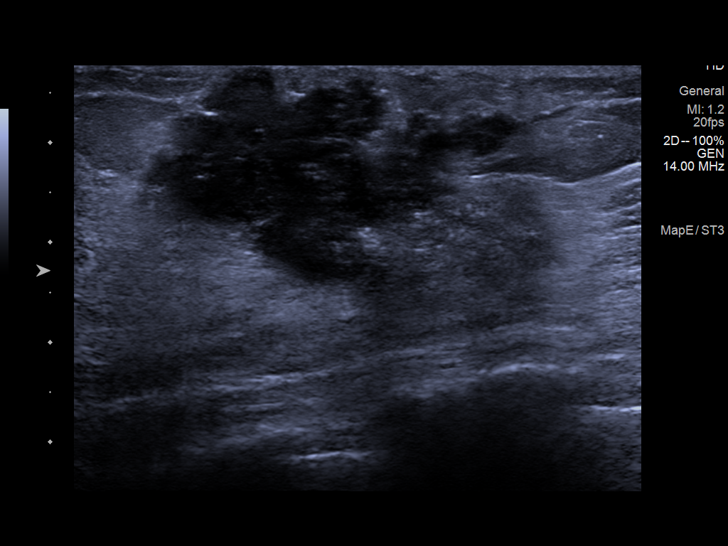
[im 6/15]
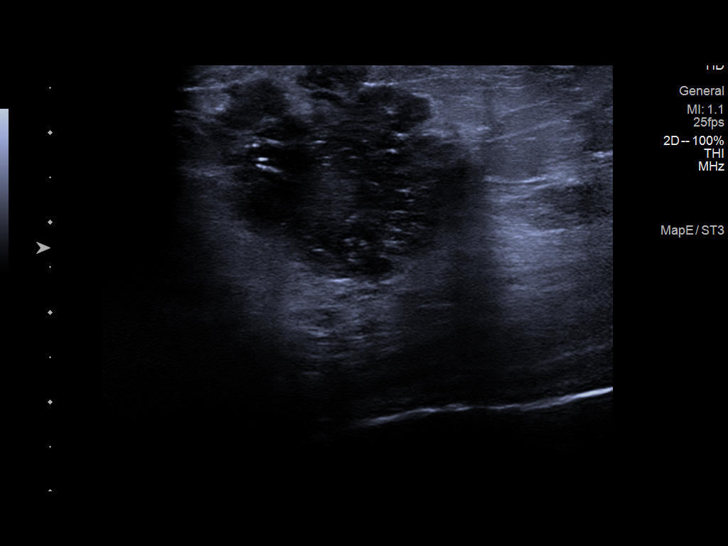
[im 7/15]
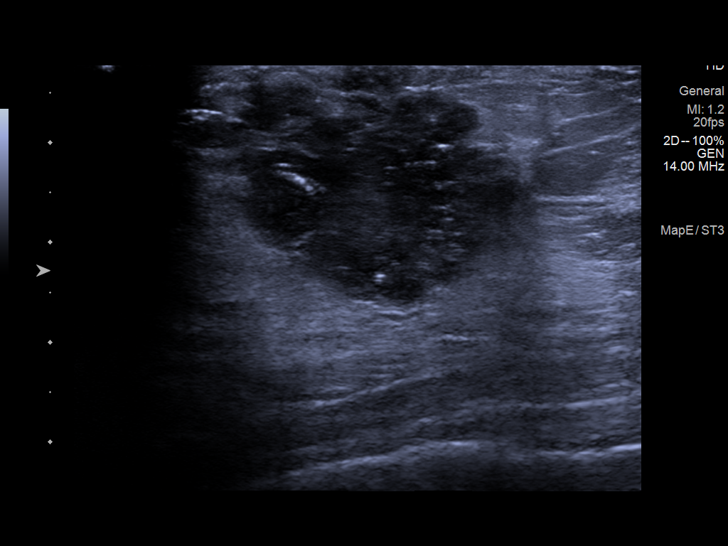
[im 8/15]
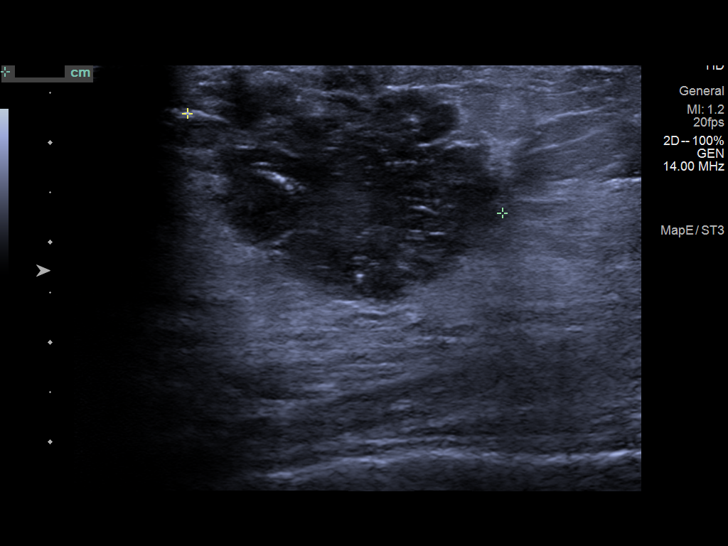
[im 9/15]
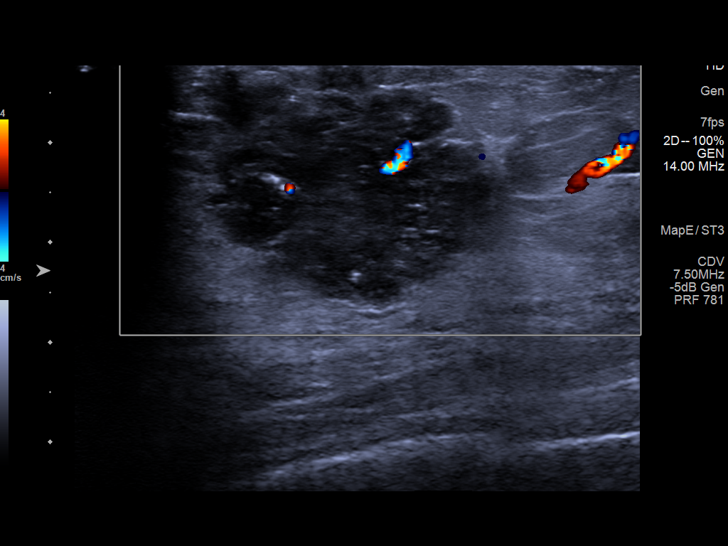
[im 10/15]
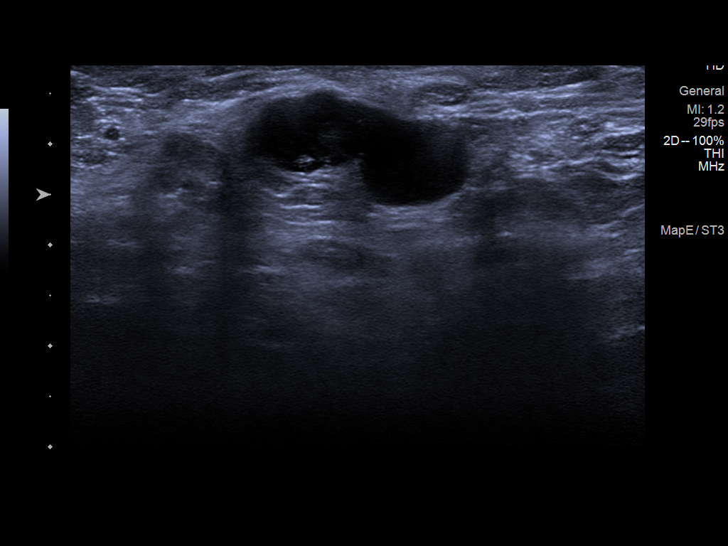
[im 11/15]
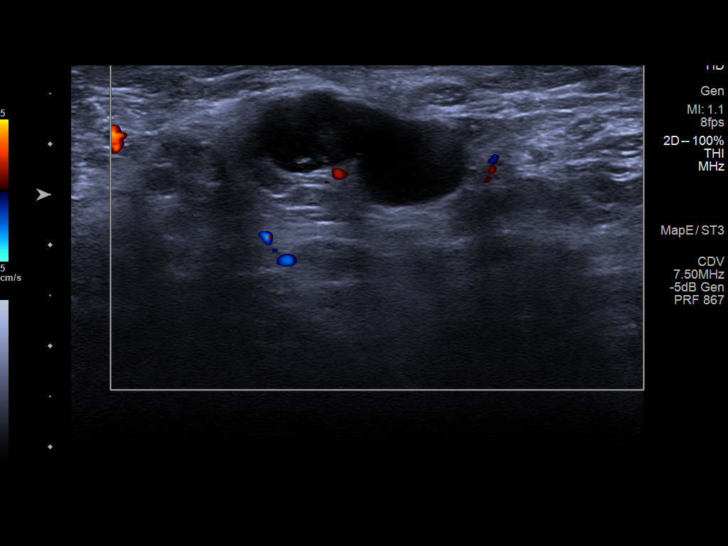
[im 13/15]
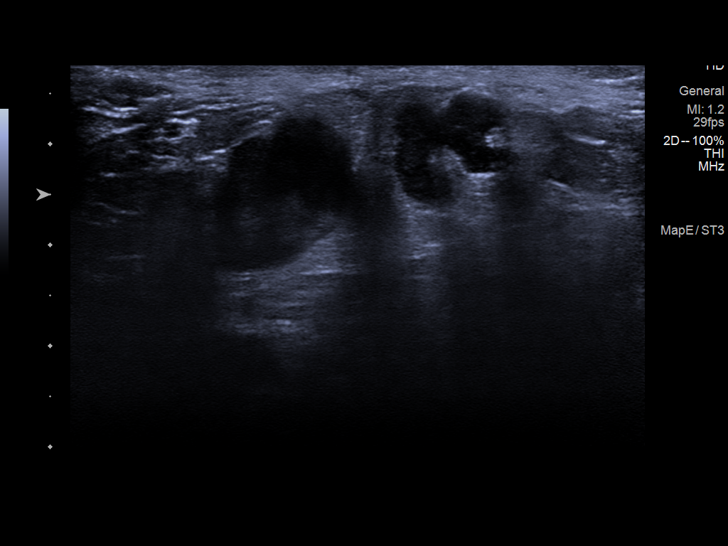
[im 14/15]
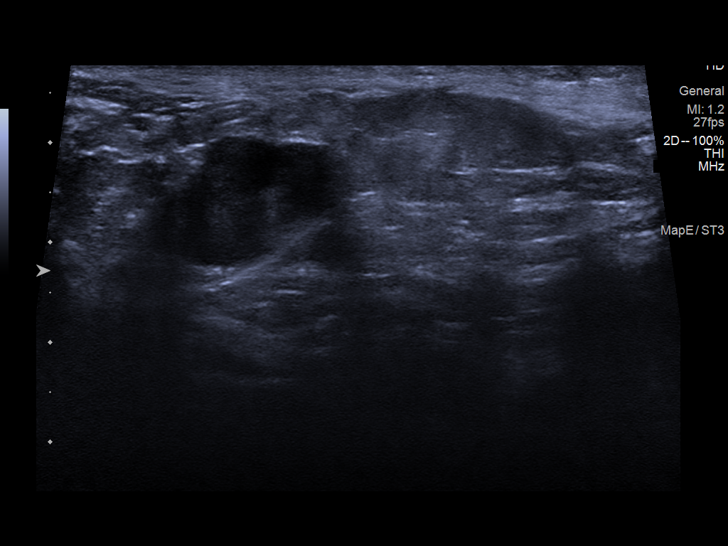
[im 15/15]
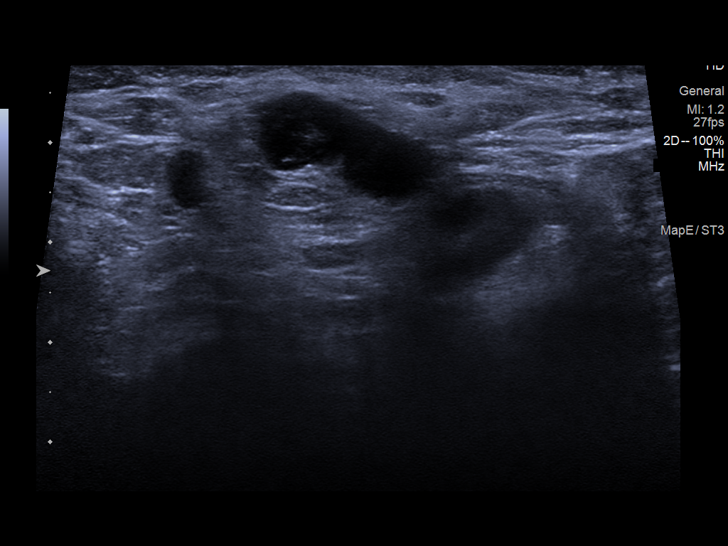

[13 of 15 positions shown; findings below may reference images not displayed]

The patient currently resides in [REDACTED] and has
history of CVA, seizures, subdural hematoma, chronic tracheostomy
and gastrostomy. She was recently found pulseless post CPR which
prompted the recent chest CT.

EXAM:
DIGITAL DIAGNOSTIC BILATERAL MAMMOGRAM WITH TOMOSYNTHESIS AND CAD;
ULTRASOUND LEFT BREAST LIMITED
ACR Breast Density Category c: The breast tissue is heterogeneously
dense, which may obscure small masses.
FINDINGS: There is an irregular spiculated approximate 3 cm mass with
associated calcifications, with mass and calcifications all together
measuring at least 4.6 cm. There is associated skin thickening and
retraction as well as flattening of the nipple areola complex. No
suspicious masses or calcifications seen in the right breast.

Physical examination reveals a firm mass involving the outer left
breast.

Targeted ultrasound of the left breast was performed. There is an
irregular hypoechoic mass containing calcifications and extending to
the level of the dermis at 2 o'clock 3 cm from nipple measuring
x 2.3 x 3.3 cm. This corresponds well with the mass in the left
breast at mammography. Multiple abnormal lymph nodes are identified
in the left axilla.
IMPRESSION: 1. Highly suspicious palpable left breast mass, with mass and
calcifications all together measuring 4.6 cm extending to the level
of the dermis.

2. Bulky left axillary lymphadenopathy consistent with metastatic
disease.

RECOMMENDATION:
Recommend ultrasound-guided core biopsy of the dominant mass in the
left breast at the 2 o'clock position. This will be scheduled for
the patient.

I have discussed the findings and recommendations with the patient.
If applicable, a reminder letter will be sent to the patient
regarding the next appointment.

BI-RADS CATEGORY  5: Highly suggestive of malignancy.

## 2021-06-09 IMAGING — MG DIGITAL DIAGNOSTIC BILAT W/ TOMO W/ CAD
6 of 12 series · 6 of 36 positions shown · non-contrast
Comparison: None.

CLINICAL DATA: 65-year-old female with palpable area of concern in
the left breast and recent chest CT dated [DATE] demonstrating a
large at least 3 cm mass with associated skin thickening involving
the left breast as well as bulky left axillary and retropectoral
lymphadenopathy.
TECHNIQUE: Bilateral digital diagnostic mammography and breast tomosynthesis
was performed. The images were evaluated with computer-aided
detection.; Targeted ultrasound examination of the left breast was
performed.

[L MLO synth-2D (1 of 3)]
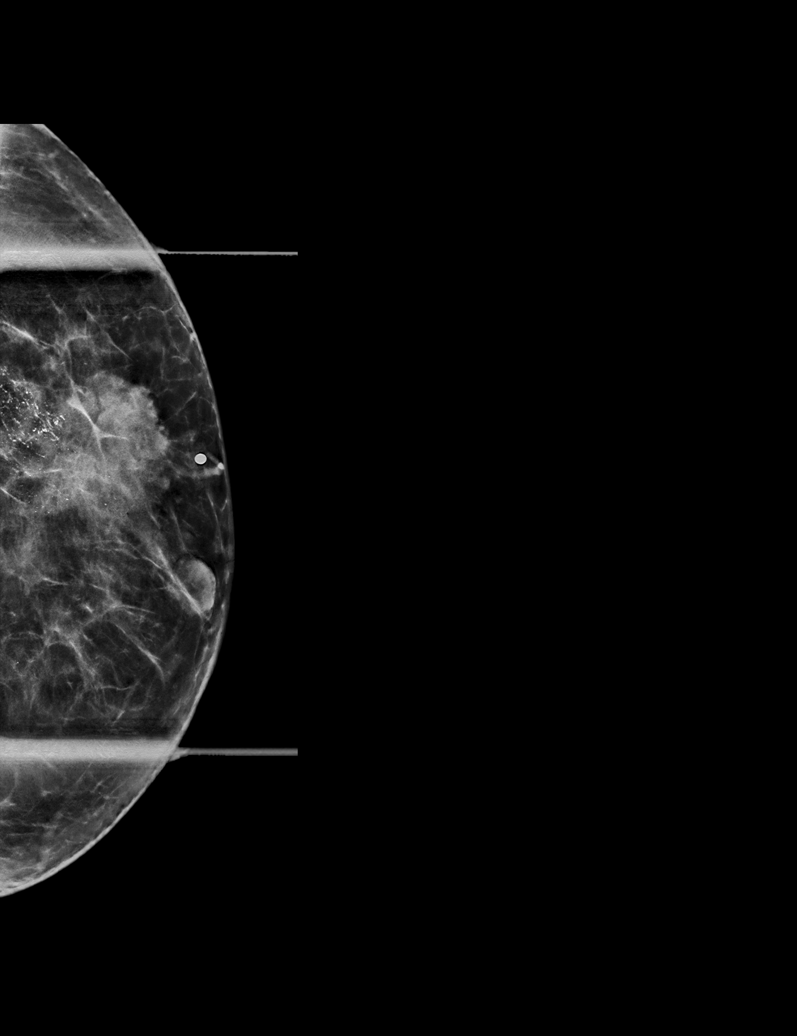

[L MLO synth-2D (2 of 3)]
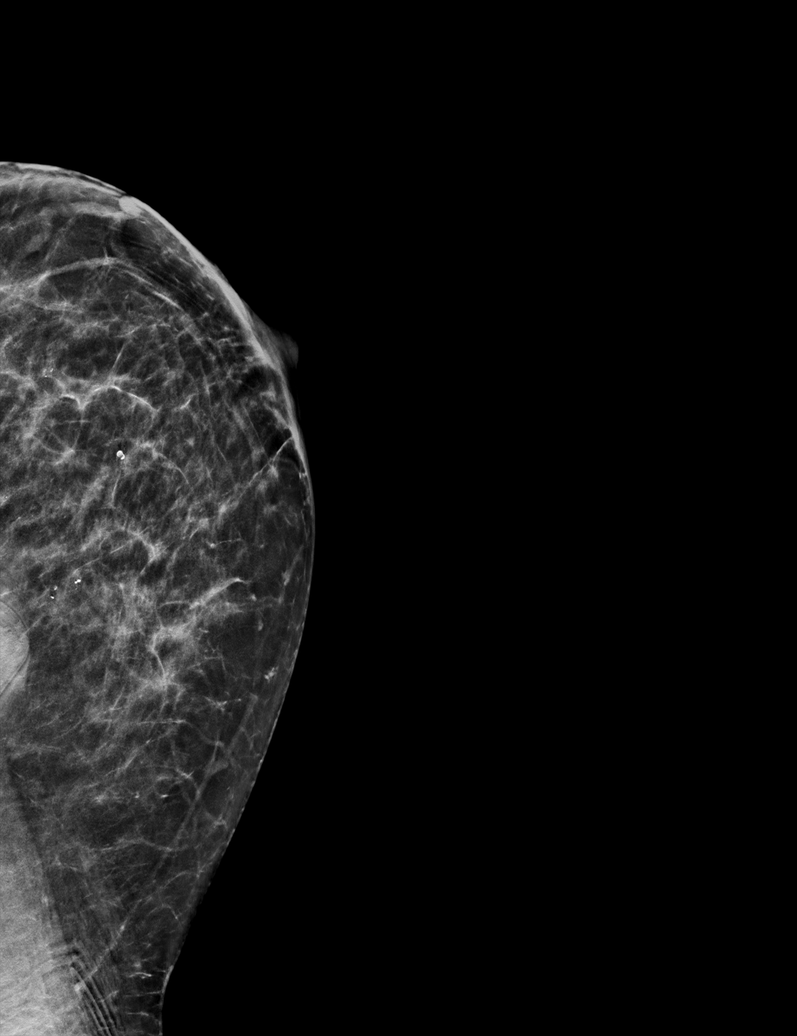

[L MLO synth-2D (3 of 3)]
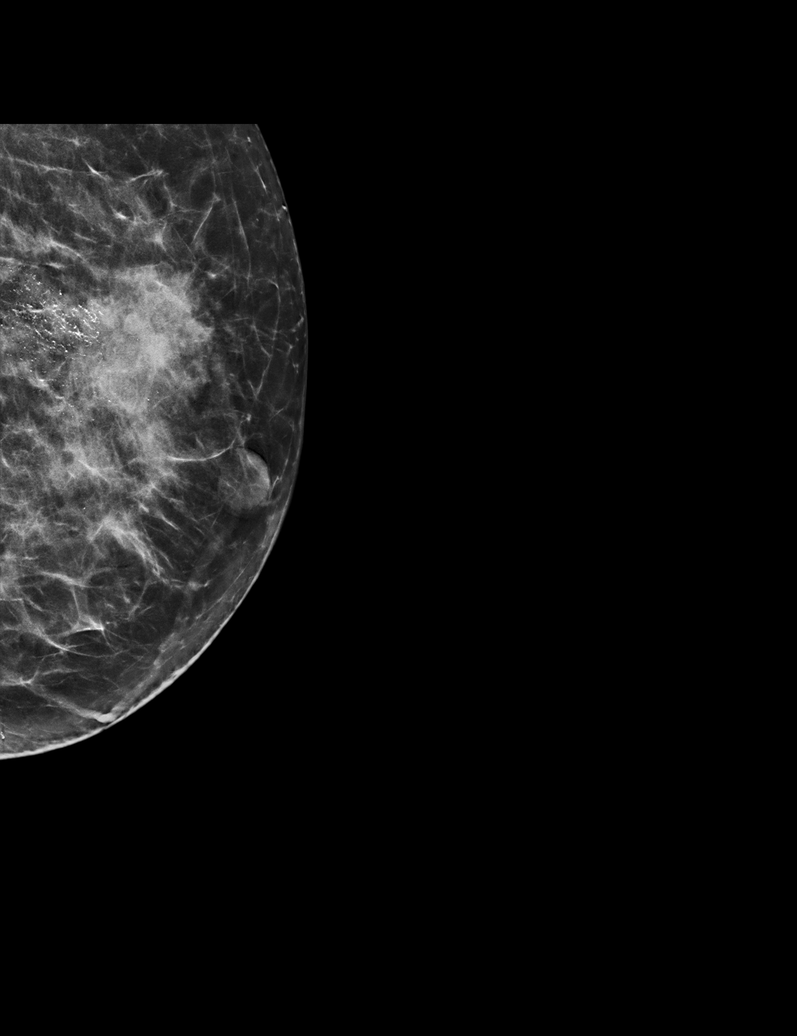

[R MLO synth-2D]
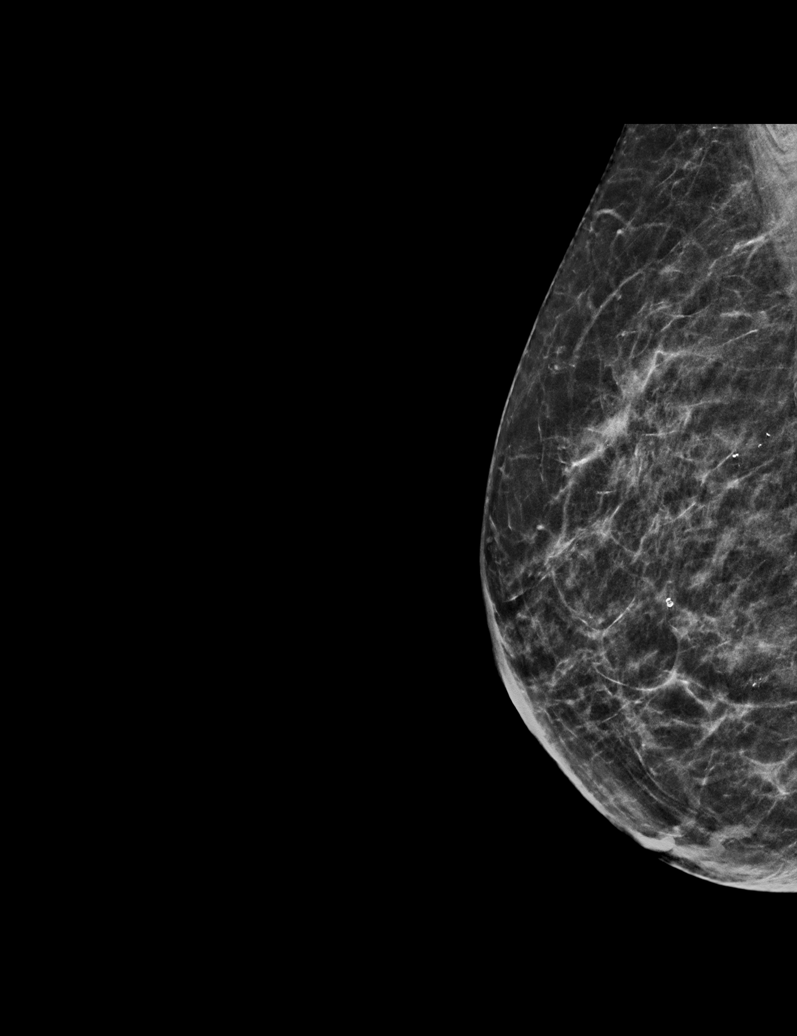

[R CC synth-2D]
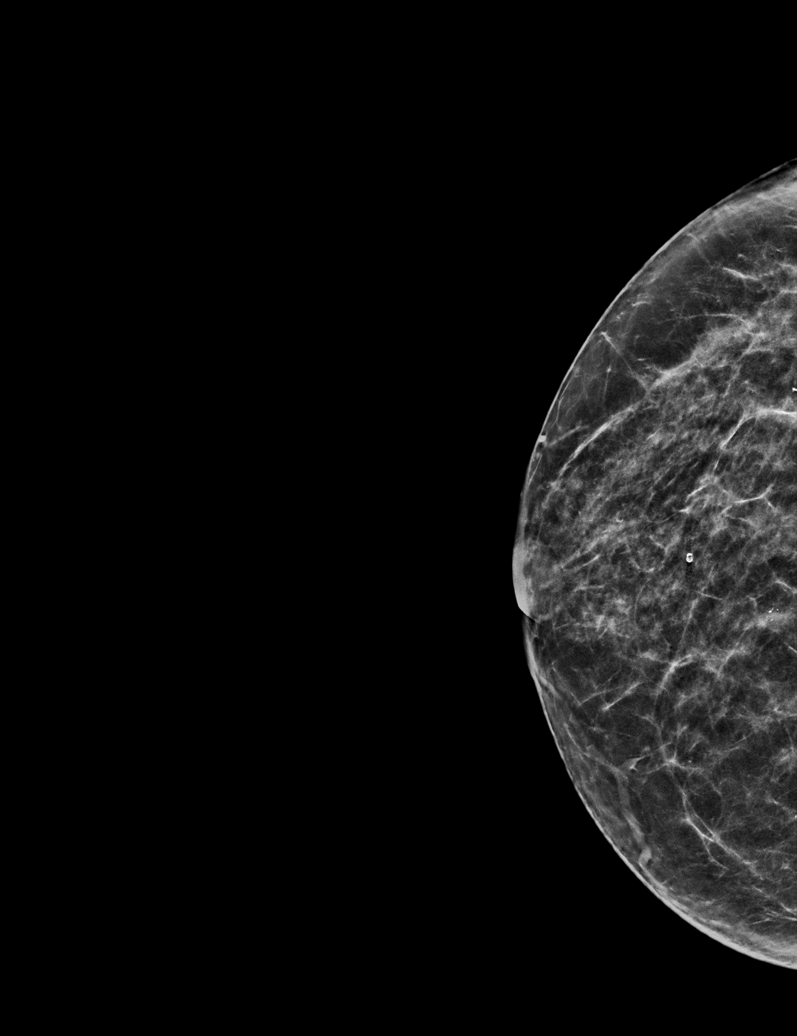

[L CC synth-2D]
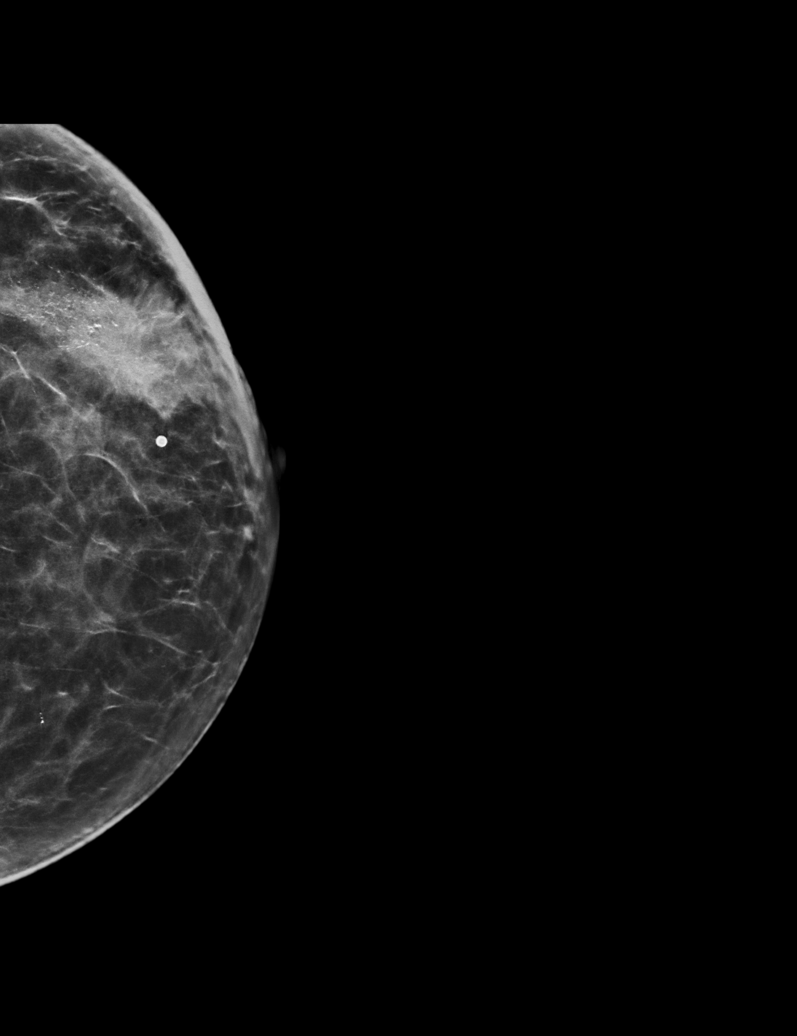

[6 of 36 positions shown; findings below may reference images not displayed]

The patient currently resides in [REDACTED] and has
history of CVA, seizures, subdural hematoma, chronic tracheostomy
and gastrostomy. She was recently found pulseless post CPR which
prompted the recent chest CT.

EXAM:
DIGITAL DIAGNOSTIC BILATERAL MAMMOGRAM WITH TOMOSYNTHESIS AND CAD;
ULTRASOUND LEFT BREAST LIMITED
ACR Breast Density Category c: The breast tissue is heterogeneously
dense, which may obscure small masses.
FINDINGS: There is an irregular spiculated approximate 3 cm mass with
associated calcifications, with mass and calcifications all together
measuring at least 4.6 cm. There is associated skin thickening and
retraction as well as flattening of the nipple areola complex. No
suspicious masses or calcifications seen in the right breast.

Physical examination reveals a firm mass involving the outer left
breast.

Targeted ultrasound of the left breast was performed. There is an
irregular hypoechoic mass containing calcifications and extending to
the level of the dermis at 2 o'clock 3 cm from nipple measuring
x 2.3 x 3.3 cm. This corresponds well with the mass in the left
breast at mammography. Multiple abnormal lymph nodes are identified
in the left axilla.
IMPRESSION: 1. Highly suspicious palpable left breast mass, with mass and
calcifications all together measuring 4.6 cm extending to the level
of the dermis.

2. Bulky left axillary lymphadenopathy consistent with metastatic
disease.

RECOMMENDATION:
Recommend ultrasound-guided core biopsy of the dominant mass in the
left breast at the 2 o'clock position. This will be scheduled for
the patient.

I have discussed the findings and recommendations with the patient.
If applicable, a reminder letter will be sent to the patient
regarding the next appointment.

BI-RADS CATEGORY  5: Highly suggestive of malignancy.

## 2021-06-11 ENCOUNTER — Other Ambulatory Visit: Payer: Self-pay

## 2021-06-11 ENCOUNTER — Ambulatory Visit (HOSPITAL_COMMUNITY)
Admission: RE | Admit: 2021-06-11 | Discharge: 2021-06-11 | Disposition: A | Discharge status: Home / Self Care | Payer: Medicaid Other | Source: Ambulatory Visit | Attending: Hematology | Admitting: Hematology

## 2021-06-11 DIAGNOSIS — N6321 Unspecified lump in the left breast, upper outer quadrant: Secondary | ICD-10-CM | POA: Insufficient documentation

## 2021-06-11 DIAGNOSIS — N6320 Unspecified lump in the left breast, unspecified quadrant: Secondary | ICD-10-CM | POA: Insufficient documentation

## 2021-06-11 IMAGING — PT NM PET TUM IMG INITIAL (PI) SKULL BASE T - THIGH
1 of 8 series · 1 of 25 positions shown · non-contrast
Comparison: CT chest [DATE]

CLINICAL DATA: Initial treatment strategy for LEFT breast
carcinoma.

EXAM:
NUCLEAR MEDICINE PET SKULL BASE TO THIGH
TECHNIQUE: 7.9 mCi F-18 FDG was injected intravenously. Full-ring PET imaging
was performed from the skull base to thigh after the radiotracer. CT
data was obtained and used for attenuation correction and anatomic
localization.
Fasting blood glucose: 102 mg/dl

[Series 3: ctac · axial · 3.0mm · 0.98mm/px · 1 of 263 slices shown]
[im 263/263  brain]
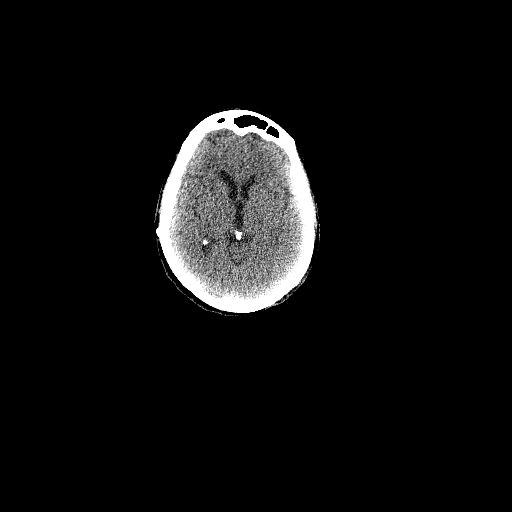

[1 of 25 positions shown; findings below may reference images not displayed]

FINDINGS: Mediastinal blood pool activity: SUV max

Liver activity: SUV max NA

NECK: No hypermetabolic lymph nodes in the neck.

Incidental CT findings: none

CHEST: Hypermetabolic mass in the lateral aspect of the LEFT breast
measures 3.4 by 2.2 cm with SUV max equal 5.1.

Cluster of enlarged hypermetabolic LEFT axillary lymph nodes are
present. Hypermetabolic nodes extend to the sub pectoralis nodal
station. For example 18 mm sub pectoralis node on image 69/3 with
SUV max equal 5.2. More peripheral axillary node measures 12 mm
(image 76/3) with SUV max 3.6.

There is hypermetabolic skin thickening in the high LEFT axilla with
SUV max equal 4.9 on image 83. Skin thickening to 6 mm (image 86/CT
series 3).

No hypermetabolic central thoracic lymph nodes. No abnormality of
the RIGHT breast or axilla.

Incidental CT findings: No suspicious pulmonary nodules.

ABDOMEN/PELVIS: No abnormal hypermetabolic activity within the
liver, pancreas, adrenal glands, or spleen. No hypermetabolic lymph
nodes in the abdomen or pelvis.

PEG  tube in place.

VP shunt with catheter tip in the LEFT upper quadrant. No fluid
collections.

Enlarged lobular uterus with calcified leiomyoma.

Incidental CT findings: none

SKELETON: No focal hypermetabolic activity to suggest skeletal
metastasis.

Incidental CT findings: none
IMPRESSION: 1. Hypermetabolic mass in LEFT breast consistent primary breast
carcinoma.
2. Enlarged hypermetabolic LEFT axillary lymph nodes extend to the
sub pectoralis nodal station consistent nodal metastasis.
3. Skin thickening in the LEFT axilla is concerning for inflammatory
breast cancer.
4. No hypermetabolic central thoracic metastasis.
5. No distant metastatic disease.
6. Incidental note of PEG tube and VP shunt without complicating
features

## 2021-06-11 MED ORDER — FLUDEOXYGLUCOSE F - 18 (FDG) INJECTION
7.9000 | Freq: Once | INTRAVENOUS | Status: AC | PRN
  Administered 2021-06-11: 7.9 via INTRAVENOUS

## 2021-06-16 ENCOUNTER — Ambulatory Visit (HOSPITAL_COMMUNITY)
Admission: RE | Admit: 2021-06-16 | Discharge: 2021-06-16 | Disposition: A | Discharge status: Home / Self Care | Payer: Medicaid Other | Source: Ambulatory Visit | Attending: Hematology | Admitting: Hematology

## 2021-06-16 ENCOUNTER — Encounter (HOSPITAL_COMMUNITY): Payer: Self-pay

## 2021-06-16 ENCOUNTER — Other Ambulatory Visit: Payer: Self-pay

## 2021-06-16 ENCOUNTER — Ambulatory Visit (HOSPITAL_COMMUNITY)
Admission: RE | Admit: 2021-06-16 | Discharge: 2021-06-16 | Disposition: A | Discharge status: Home / Self Care | Payer: Medicaid Other | Source: Ambulatory Visit | Attending: Family | Admitting: Family

## 2021-06-16 ENCOUNTER — Other Ambulatory Visit (HOSPITAL_COMMUNITY): Payer: Self-pay | Admitting: Family

## 2021-06-16 DIAGNOSIS — R928 Other abnormal and inconclusive findings on diagnostic imaging of breast: Secondary | ICD-10-CM | POA: Insufficient documentation

## 2021-06-16 IMAGING — US US BREAST BX W LOC DEV 1ST LESION IMG BX SPEC US GUIDE*L*
1 series · 12 of 18 positions shown · non-contrast
Comparison: Previous exam(s).
COMPARISON: Previous exam(s).

Addendum:
CLINICAL DATA: 65-year-old female with a highly suspicious 4.4 cm
mass with associated calcifications in the left breast at the 2
o'clock position presents for ultrasound-guided core biopsy.

EXAM:
ULTRASOUND GUIDED LEFT BREAST CORE NEEDLE BIOPSY

[Series 1: us breast bx w loc dev 1st lesion img bx spec us g · 0.07mm/px · 12 of 18 slices shown]
[im 1/18]
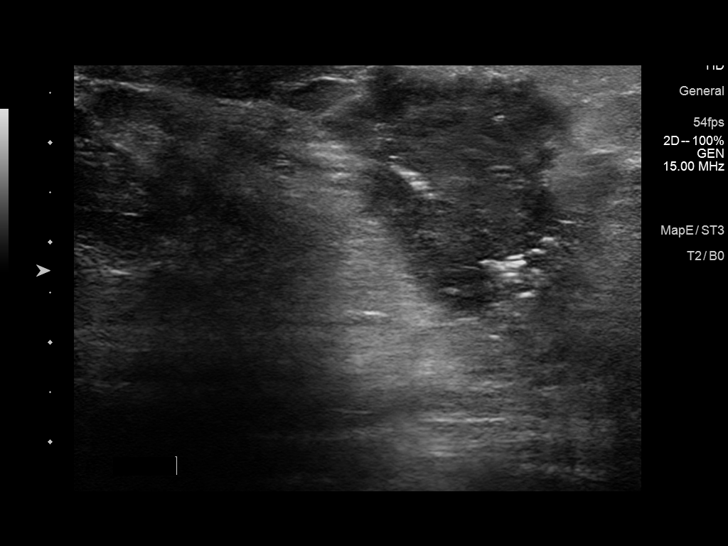
[im 3/18]
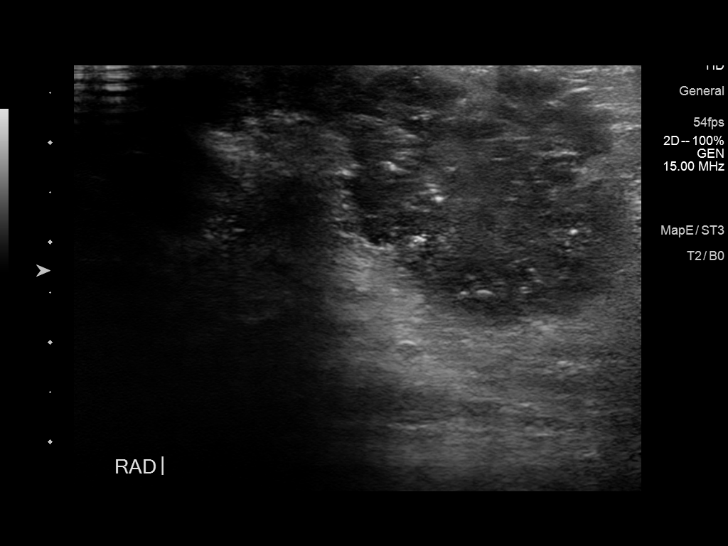
[im 4/18]
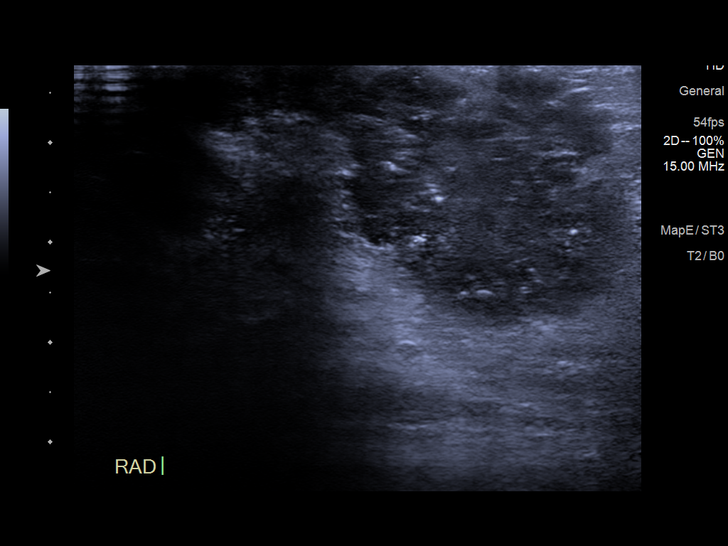
[im 6/18]
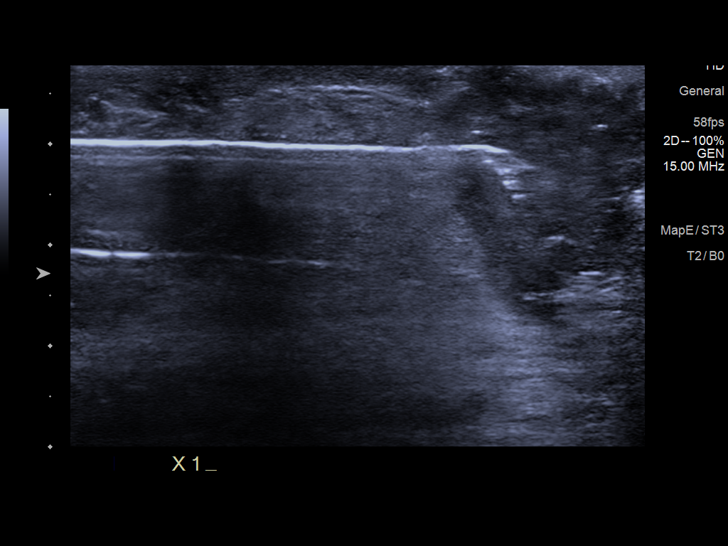
[im 7/18]
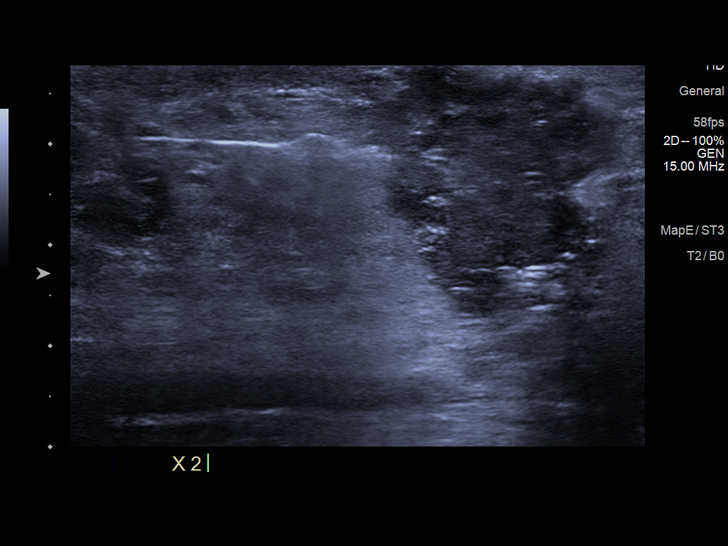
[im 9/18]
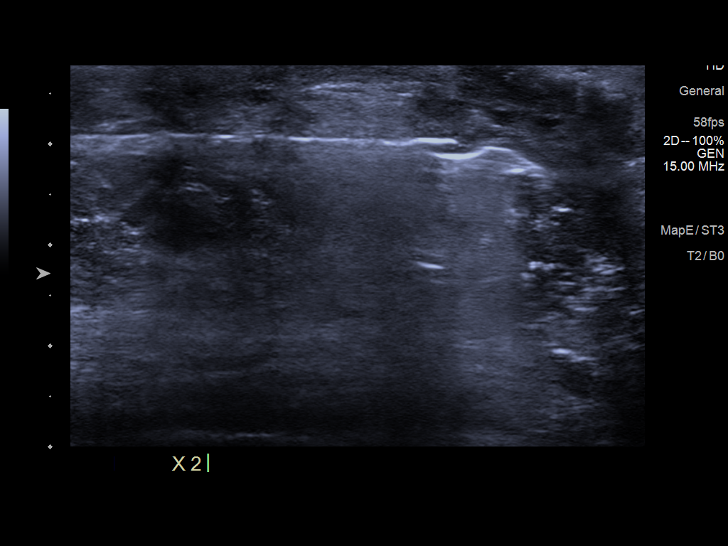
[im 10/18]
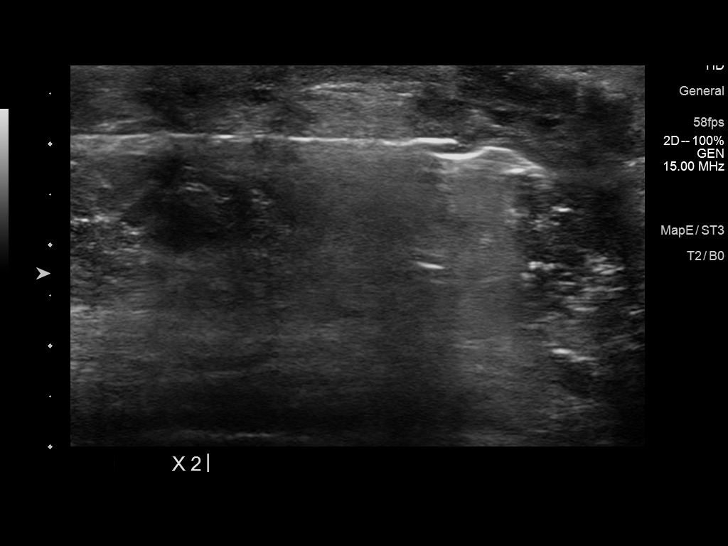
[im 12/18]
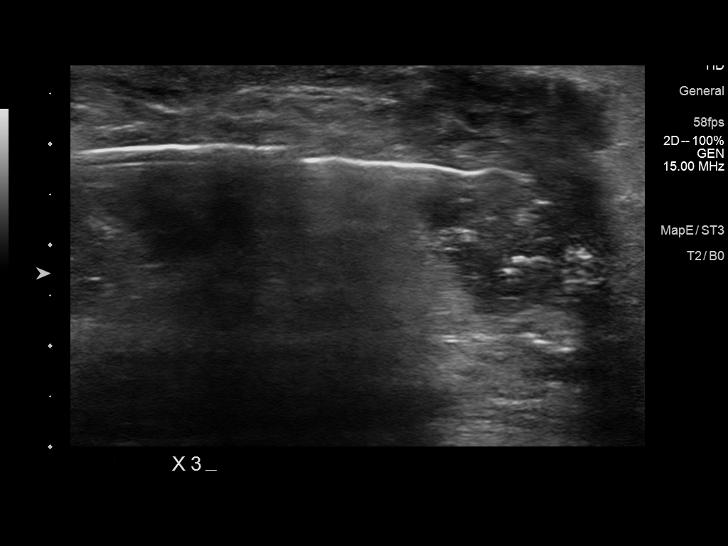
[im 13/18]
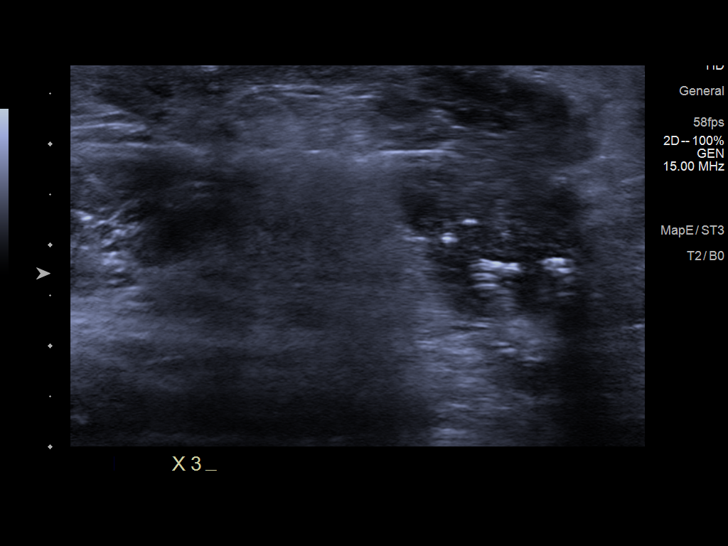
[im 15/18]
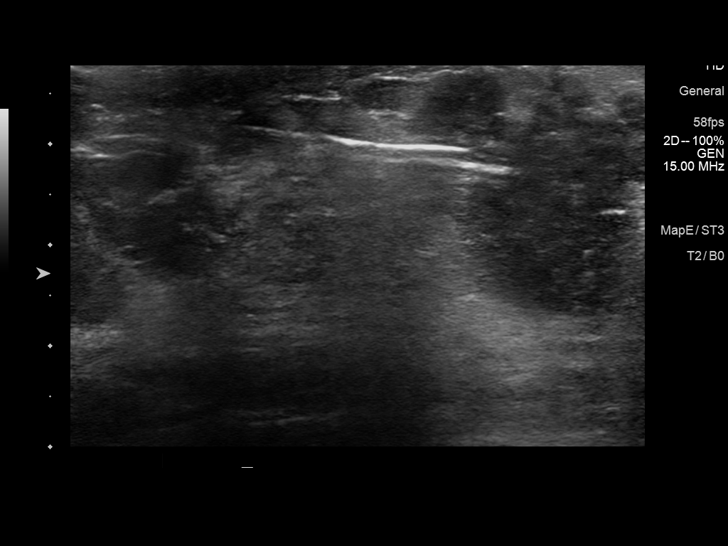
[im 16/18]
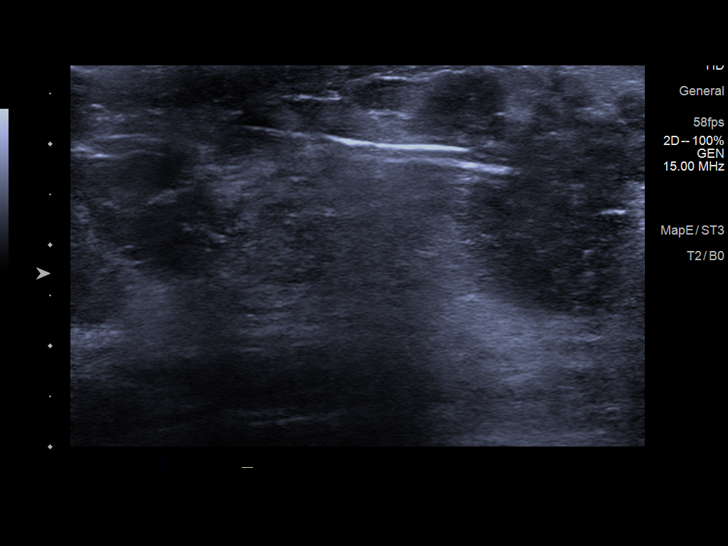
[im 18/18]
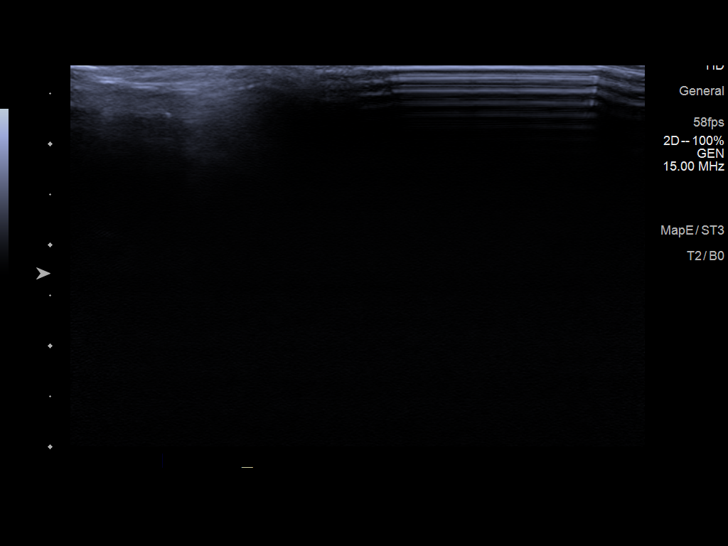

[12 of 18 positions shown; findings below may reference images not displayed]



Lesion quadrant: Upper outer

Using sterile technique and 1% Lidocaine as local anesthetic, under
direct ultrasound visualization, a 14 gauge MISSAWI device was
used to perform biopsy of the mass in the left breast at the 2
o'clock position using a medial to lateral approach. At the
conclusion of the procedure a ribbon shaped tissue marker clip was
deployed into the biopsy cavity. Follow up 2 view mammogram was
performed and dictated separately.
IMPRESSION: Ultrasound guided biopsy of the mass in the left breast at the 2
o'clock position. No apparent complications.

ADDENDUM:
Pathology revealed GRADE II INVASIVE MAMMARY CARCINOMA of the LEFT
breast, 2 'clock (ribbon clip). This was found to be concordant by
Dr. MISSAWI.

Pathology results were discussed on [DATE] with the patient's
MISSAWI by telephone. The patient reported doing well
after the biopsy with tenderness at the site. Post biopsy
instructions and care were reviewed and questions were answered. The
patient was encouraged to call [HOSPITAL] -[HOSPITAL] Mammography
for any additional concerns.

The patient should continue her plan of care with Dr. MISSAWI
MISSAWI follow up appointment scheduled [DATE].

Pathology results reported by MISSAWI RN on [DATE].



Lesion quadrant: Upper outer

Using sterile technique and 1% Lidocaine as local anesthetic, under
direct ultrasound visualization, a 14 gauge MISSAWI device was
used to perform biopsy of the mass in the left breast at the 2
o'clock position using a medial to lateral approach. At the
conclusion of the procedure a ribbon shaped tissue marker clip was
deployed into the biopsy cavity. Follow up 2 view mammogram was
performed and dictated separately.
IMPRESSION: Ultrasound guided biopsy of the mass in the left breast at the 2
o'clock position. No apparent complications.

## 2021-06-16 MED ORDER — LIDOCAINE-EPINEPHRINE (PF) 1 %-1:200000 IJ SOLN
10.0000 mL | Freq: Once | INTRAMUSCULAR | Status: AC
  Administered 2021-06-16: 10 mL via INTRADERMAL

## 2021-06-16 MED ORDER — LIDOCAINE HCL (PF) 2 % IJ SOLN
INTRAMUSCULAR | Status: AC
  Administered 2021-06-16: 10 mL
  Filled 2021-06-16: qty 10

## 2021-06-16 NOTE — Progress Notes (Signed)
PT tolerated left breast biopsy well today with NAD noted. PT verbalized understanding of discharge instructions. PT ambulated back to waiting area with walker at this time. Staff from nursing facility also updated. PT given ice packs and copy of discharge instructions and left with no acute distress noted.  ?

## 2021-06-24 LAB — SURGICAL PATHOLOGY

## 2021-06-28 ENCOUNTER — Encounter (HOSPITAL_COMMUNITY): Payer: Self-pay | Admitting: Hematology

## 2021-06-28 DIAGNOSIS — C50912 Malignant neoplasm of unspecified site of left female breast: Secondary | ICD-10-CM | POA: Insufficient documentation

## 2021-06-29 ENCOUNTER — Inpatient Hospital Stay (HOSPITAL_COMMUNITY): Payer: Medicaid Other | Attending: Hematology | Admitting: Hematology

## 2021-06-29 VITALS — BP 181/98 | HR 95 | Temp 96.3°F | Resp 18 | Ht 66.0 in | Wt 140.7 lb

## 2021-06-29 DIAGNOSIS — N6321 Unspecified lump in the left breast, upper outer quadrant: Secondary | ICD-10-CM | POA: Insufficient documentation

## 2021-06-29 DIAGNOSIS — Z79899 Other long term (current) drug therapy: Secondary | ICD-10-CM | POA: Insufficient documentation

## 2021-06-29 DIAGNOSIS — N6320 Unspecified lump in the left breast, unspecified quadrant: Secondary | ICD-10-CM | POA: Diagnosis present

## 2021-06-29 DIAGNOSIS — Z801 Family history of malignant neoplasm of trachea, bronchus and lung: Secondary | ICD-10-CM | POA: Insufficient documentation

## 2021-06-29 DIAGNOSIS — Z8041 Family history of malignant neoplasm of ovary: Secondary | ICD-10-CM | POA: Insufficient documentation

## 2021-06-29 DIAGNOSIS — Z803 Family history of malignant neoplasm of breast: Secondary | ICD-10-CM | POA: Diagnosis not present

## 2021-06-29 DIAGNOSIS — R590 Localized enlarged lymph nodes: Secondary | ICD-10-CM | POA: Diagnosis not present

## 2021-06-29 DIAGNOSIS — Z8673 Personal history of transient ischemic attack (TIA), and cerebral infarction without residual deficits: Secondary | ICD-10-CM | POA: Insufficient documentation

## 2021-06-29 MED ORDER — ANASTROZOLE 1 MG PO TABS: 1.0000 mg | ORAL_TABLET | Freq: Every day | ORAL | 6 refills | Status: DC

## 2021-06-29 NOTE — Patient Instructions (Addendum)
[place] at [facility] ?Discharge Instructions ? ? ?You were seen and examined today by Dr. Delton Coombes. ? ?He reviewed the results of the breast biopsy and PET scan.  The breast biopsy shows invasive mammary carcinoma (cancer). The PET scan shows that this cancer has spread to the lymph nodes under your left arm, but has not spread to any distant sites (like bones or lungs).  ? ?We will arrange for you to see a surgeon to have mass removed in attempt to cure this cancer.  We will also send an estrogen blocking pill to your pharmacy called anastrozole.  You will take one pill daily.  It was seen on biopsy that your cancer feeds on estrogen.  This pill will help treat and shrink this cancer. ? ?We will also get a bone density test. ? ?Return as scheduled in 4 weeks.  ? ? ? ? ?Thank you for choosing [place] at [facility] to provide your oncology and hematology care.  To afford each patient quality time with our provider, please arrive at least 15 minutes before your scheduled appointment time.  ? ?If you have a lab appointment with the [facility] please come in thru the Main Entrance and check in at the main information desk. ? ?You need to re-schedule your appointment should you arrive 10 or more minutes late.  We strive to give you quality time with our providers, and arriving late affects you and other patients whose appointments are after yours.  Also, if you no show three or more times for appointments you may be dismissed from the clinic at the providers discretion.     ?Again, thank you for choosing [facility].  Our hope is that these requests will decrease the amount of time that you wait before being seen by our physicians.       ?_____________________________________________________________ ? ?Should you have questions after your visit to [facility], please contact our office at [phone] and follow the prompts.  Our  office hours are 8:00 a.m. and 4:30 p.m. Monday - Friday.  Please note that voicemails left after 4:00 p.m. may not be returned until the following business day.  We are closed weekends and major holidays.  You do have access to a nurse 24-7, just call the main number to the clinic [phone] and do not press any options, hold on the line and a nurse will answer the phone.   ? ?For prescription refill requests, have your pharmacy contact our office and allow 72 hours.   ? ?Due to Covid, you will need to wear a mask upon entering the hospital. If you do not have a mask, a mask will be given to you at the Main Entrance upon arrival. For doctor visits, patients may have 1 support person age 50 or older with them. For treatment visits, patients can not have anyone with them due to social distancing guidelines and our immunocompromised population.  ? ?   ?

## 2021-06-29 NOTE — Progress Notes (Signed)
? ?[facility] ?[address] ?[place], [name] [address] ? ? ?Patient Care Team: ?Everlena Cooper, NP as PCP - General (Family Medicine) ? ?SUMMARY OF ONCOLOGIC HISTORY: ?Oncology History  ? No history exists.  ? ? ?CHIEF COMPLIANT: Follow-up of left breast mass with axillary lymphadenopathy ? ? ?INTERVAL HISTORY: [name] is a 66 y.o. female here today for follow up of her left breast mass with axillary lymphadenopathy. Her last visit was on 06/02/2021.  ? ?Today she reports feeling well, and she is accompanied by her daughter. She currently walks with a  a walker. She denies fatigue. She denies history of cardiac issue. She reports weakness in her left arm and denies weakness in her legs. She denies history of cardia issues.  ? ?She has been living at [facility] for 6 months; prior to this she has living with her daughter who lives in [place]. Her daughter has lung cancer, her sister has breast cancer, her mother has lung cancer, her sister had ovarian cancer. Prior to retirement she worked at the [employer] and [employer]. She quit smoking 2 years ago after smoking 1/2 ppd for 56 years.  ? ?REVIEW OF SYSTEMS:   ?Review of Systems  ?Constitutional:  Negative for appetite change and fatigue.  ?Respiratory:  Positive for cough and shortness of breath.   ?Neurological:  Positive for extremity weakness (L arm).  ?All other systems reviewed and are negative. ? ?I have reviewed the past medical history, past surgical history, social history and family history with the patient and they are unchanged from previous note. ? ? ?ALLERGIES:   ?has No Known Allergies. ? ? ?MEDICATIONS:  ?Current Outpatient Medications  ?Medication Sig Dispense Refill  ? acetaminophen (TYLENOL) 325 MG tablet Place 2 tablets (650 mg total) into feeding tube every 6 (six) hours as needed for mild pain.    ? ALPRAZolam (XANAX) 0.5 MG tablet Take 0.5 mg by mouth 3 (three) times daily as needed.    ? amLODipine  (NORVASC) 5 MG tablet Place 1 tablet (5 mg total) into feeding tube daily.    ? chlorhexidine (PERIDEX) 0.12 % solution 15 mLs by Mouth Rinse route 2 (two) times daily. 120 mL 0  ? DULoxetine (CYMBALTA) 20 MG capsule Take 20 mg by mouth daily.    ? fluticasone (FLONASE) 50 MCG/ACT nasal spray Place 1 spray into both nostrils 2 (two) times daily.  2  ? glycopyrrolate (ROBINUL) 1 MG tablet Take 1 tablet (1 mg total) by mouth 2 (two) times daily. (Patient taking differently: Place 1 mg into feeding tube 2 (two) times daily.)    ? guaiFENesin (ROBITUSSIN) 100 MG/5ML liquid Place 10 mLs into feeding tube every 8 (eight) hours. 120 mL 0  ? ipratropium-albuterol (DUONEB) 0.5-2.5 (3) MG/3ML SOLN Take 3 mLs by nebulization every 4 (four) hours as needed. 360 mL   ? levETIRAcetam (KEPPRA) 100 MG/ML solution Place 7.5 mLs (750 mg total) into feeding tube 2 (two) times daily. 473 mL 12  ? levothyroxine (SYNTHROID) 100 MCG tablet Place 1 tablet (100 mcg total) into feeding tube daily at 6 (six) AM. 30 tablet 3  ? Nutritional Supplements (FEEDING SUPPLEMENT, OSMOLITE 1.2 CAL,) LIQD Place 355 mLs into feeding tube 5 (five) times daily.  0  ? polyethylene glycol (MIRALAX / GLYCOLAX) 17 g packet Place 17 g into feeding tube 2 (two) times daily. 14 each 0  ? senna (SENOKOT) 8.6 MG TABS tablet Place 1 tablet (8.6 mg  total) into feeding tube daily. 120 tablet 0  ? Water For Irrigation, Sterile (FREE WATER) SOLN Place 150 mLs into feeding tube every 6 (six) hours.    ? QUEtiapine (SEROQUEL) 50 MG tablet Place 1 tablet (50 mg total) into feeding tube 2 (two) times daily. 60 tablet 0  ? ?No current facility-administered medications for this visit.  ? ? ? ?PHYSICAL EXAMINATION: ?Performance status (ECOG): 3 - Symptomatic, >50% confined to bed ? ?Vitals:  ? 06/29/21 1054  ?BP: (!) 181/98  ?Pulse: 95  ?Resp: 18  ?Temp: (!) 96.3 ?F (35.7 ?C)  ?SpO2: 100%  ? ?Wt Readings from Last 3 Encounters:  ?06/29/21 140 lb 10.5 oz (63.8 kg)  ?06/02/21  139 lb 5.3 oz (63.2 kg)  ?03/27/21 135 lb 12.9 oz (61.6 kg)  ? ?Physical Exam ?Vitals reviewed.  ?Constitutional:   ?   Appearance: Normal appearance.  ?   Interventions: Nasal cannula in place.  ?Cardiovascular:  ?   Rate and Rhythm: Normal rate and regular rhythm.  ?   Pulses: Normal pulses.  ?   Heart sounds: Normal heart sounds.  ?Pulmonary:  ?   Effort: Pulmonary effort is normal.  ?   Breath sounds: Normal breath sounds.  ?Chest:  ?Breasts: ?   Left: Mass (4 cm UOQ) present. No tenderness.  ?Lymphadenopathy:  ?   Upper Body:  ?   Left upper body: Axillary adenopathy present.  ?Neurological:  ?   General: No focal deficit present.  ?   Mental Status: She is alert and oriented to person, place, and time.  ?Psychiatric:     ?   Mood and Affect: Mood normal.     ?   Behavior: Behavior normal.  ? ? ?Breast Exam Chaperone: Thana Ates   ? ? ?LABORATORY DATA:  ?I have reviewed the data as listed ? ?  Latest Ref Rng & Units 06/02/2021  ?  4:33 AM 06/01/2021  ? 10:17 AM 05/24/2021  ?  8:36 PM  ?CMP  ?Glucose 70 - 99 mg/dL 84   223   110    ?BUN 8 - 23 mg/dL 17   23   24     ?Creatinine 0.44 - 1.00 mg/dL 0.55   0.76   0.55    ?Sodium 135 - 145 mmol/L 143   138   140    ?Potassium 3.5 - 5.1 mmol/L 4.0   3.4   4.1    ?Chloride 98 - 111 mmol/L 106   100   105    ?CO2 22 - 32 mmol/L 28   27   30     ?Calcium 8.9 - 10.3 mg/dL 9.7   9.4   9.5    ?Total Protein 6.5 - 8.1 g/dL  7.9     ?Total Bilirubin 0.3 - 1.2 mg/dL  0.2     ?Alkaline Phos 38 - 126 U/L  71     ?AST 15 - 41 U/L  58     ?ALT 0 - 44 U/L  54     ? ?No results found for: [id number] ?Lab Results  ?Component Value Date  ? WBC 6.1 06/02/2021  ? HGB 13.3 06/02/2021  ? HCT 41.8 06/02/2021  ? MCV 82.1 06/02/2021  ? PLT 141 (L) 06/02/2021  ? NEUTROABS 2.1 06/01/2021  ? ? ?ASSESSMENT:  ?1.  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 ?- Left breast biopsy  on 06/16/2021 ?- Pathology: Invasive ductal carcinoma, grade 2/3, ER 80% positive, weak to moderate staining, PR 30% positive, moderate staining, Ki-67-1%.  HER2 was 2+ by IHC and negative by FISH. ?- PET scan on 4/40/3474: Hypermetabolic left breast mass and enlarged hypermetabolic left axillary lymph nodes extending to the subpectoral nodal station.  Skin thickening in the left axilla.  No distant metastatic disease. ? ?2.  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 ? ?3.  Social/family history: ?- She used to live with her daughter prior to recent placement at [facility] since June/July of 2022.  She used to work at [employer].  Quit smoking 2 years ago.  Smoked half pack per day for 45 years. ?- Sister had breast cancer.  Daughter had lung cancer.  Maternal grandmother had lung cancer.  Another sister had ovarian cancer. ? ? ?PLAN:  ?1.  Stage IIIb (T2 N3 M0) left breast IDC, ER/PR positive and HER2 negative: ?- Physical examination today with 5 cm left breast mass in the upper outer quadrant.  Left axillary lymphadenopathy palpable.  There is thickening in the left axillary skin from hidradenitis. ?- We reviewed PET scan from 2/59/5638: Hypermetabolic left breast mass and enlarged hypermetabolic left axillary lymph nodes extending to the subpectoral nodal station.  Skin thickening in the left axilla.  No distant metastatic disease. ?- We have reviewed the biopsy results from 06/16/2021 of the left breast mass. ?- Pathology shows invasive mammary carcinoma, grade 2/3, ER 80% positive, weak to moderate staining, PR 30% positive, moderate staining, Ki-67 1%.  HER2 was 2+ and negative by FISH. ?- We have discussed about her new diagnosis in detail. ?- I have recommended surgical evaluation. ?- I have recommended starting her on antiestrogen therapy anastrozole 1 mg daily in the interim while she is being seen by surgery and see if she is a  candidate. ?- Based on personal and family history, recommend genetic testing. ?- We have reviewed side effects of anastrozole in detail. ?- Also recommend a baseline DEXA scan. ?- Recommend follow-up in 4 w

## 2021-07-06 ENCOUNTER — Encounter (HOSPITAL_COMMUNITY): Payer: Self-pay

## 2021-07-06 ENCOUNTER — Ambulatory Visit (HOSPITAL_COMMUNITY)
Admission: RE | Admit: 2021-07-06 | Discharge: 2021-07-06 | Disposition: A | Discharge status: Home / Self Care | Payer: Medicaid Other | Source: Ambulatory Visit | Attending: Hematology | Admitting: Hematology

## 2021-07-06 DIAGNOSIS — N6320 Unspecified lump in the left breast, unspecified quadrant: Secondary | ICD-10-CM | POA: Diagnosis not present

## 2021-07-06 DIAGNOSIS — Z780 Asymptomatic menopausal state: Secondary | ICD-10-CM | POA: Diagnosis not present

## 2021-07-06 DIAGNOSIS — M859 Disorder of bone density and structure, unspecified: Secondary | ICD-10-CM | POA: Diagnosis not present

## 2021-07-14 ENCOUNTER — Ambulatory Visit (INDEPENDENT_AMBULATORY_CARE_PROVIDER_SITE_OTHER): Payer: Medicaid Other | Admitting: General Surgery

## 2021-07-14 ENCOUNTER — Encounter: Payer: Self-pay | Admitting: General Surgery

## 2021-07-14 VITALS — BP 149/88 | HR 90 | Temp 98.2°F | Resp 24 | Ht 66.0 in | Wt 144.4 lb

## 2021-07-14 DIAGNOSIS — C50412 Malignant neoplasm of upper-outer quadrant of left female breast: Secondary | ICD-10-CM | POA: Diagnosis not present

## 2021-07-14 DIAGNOSIS — Z170 Estrogen receptor positive status [ER+]: Secondary | ICD-10-CM

## 2021-07-14 DIAGNOSIS — Z930 Tracheostomy status: Secondary | ICD-10-CM | POA: Insufficient documentation

## 2021-07-14 NOTE — Progress Notes (Signed)
Stacy Moran; [id number]; [birth year] ? ? ?HPI ?Patient is a 66 year old black female who was referred to my care by Dr. Delton Coombes for a newly diagnosed left breast carcinoma.  The left breast mass was found on recent admission (3/23) after the patient had a cardiac arrest of unknown etiology.  Core biopsy of this revealed an ER, ER positive invasive mammary carcinoma.  PET scan was performed which revealed probable involvement of the left axilla.  Patient denies any family history of breast cancer.  She did not know about the left breast mass until it was recently found.  Patient has multiple comorbidities including tracheostomy with oxygen dependence, PEG, stroke, ventriculoperitoneal shunt with recent mild hydrocephalus, acute respiratory failure, and recent cardiac arrest. ?Past Medical History:  ?Diagnosis Date  ? Acute respiratory failure ([place])   ? Hypertension   ? Prosthetic eye globe   ? 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 Seizures ([place])   ? Status post insertion of percutaneous endoscopic gastrostomy (PEG) tube ([place])   ? Stroke [facility])   ? Subdural hematoma (HCC)   ? Tracheostomy dependent ([place])   ? Tracheostomy present ([place])   ? ? ?Past Surgical History:  ?Procedure Laterality Date  ? BURR HOLE N/A 03/18/2020  ? Procedure: Haskell Flirt;  Surgeon: [name], MD;  Location: [place];  Service: Neurosurgery;  Laterality: N/A;  ? CRANIOTOMY Left 01/30/2020  ? Procedure: LEFT FAR LATERAL CRANIOTOMY FOR ANEURYSM CLIPPING;  Surgeon: [name], MD;  Location: [place];  Service: Neurosurgery;  Laterality: Left;  ? DIRECT LARYNGOSCOPY N/A 02/29/2020  ? Procedure: DIRECT LARYNGOSCOPY;  Surgeon: [name], MD;  Location: [place];  Service: ENT;  Laterality: N/A;  ? DIRECT LARYNGOSCOPY N/A 10/03/2020  ? Procedure: DIRECT LARYNGOSCOPY w/esophagascopy;  Surgeon: [name], MD;  Location: [place];  Service: ENT;  Laterality: N/A;  ? [place] STANDBY N/A 12/14/2020  ? Procedure:  INTUBATION-ENDOTRACHEAL WITH TRACHEOSTOMY STANDBY;  Surgeon: Carloyn Manner, MD;  Location: [facility];  Service: ENT;  Laterality: N/A;  ? IR ANGIO INTRA EXTRACRAN SEL INTERNAL CAROTID BILAT MOD SED  01/30/2020  ? IR ANGIO VERTEBRAL SEL VERTEBRAL UNI L MOD SED  01/30/2020  ? IR CM INJ ANY COLONIC TUBE W/FLUORO  07/03/2020  ? IR GASTROSTOMY TUBE MOD SED  02/22/2020  ? IR REPLC GASTRO/COLONIC TUBE PERCUT W/FLUORO  07/03/2020  ? LAPAROSCOPIC REVISION VENTRICULAR-PERITONEAL (V-P) SHUNT N/A 03/18/2020  ? Procedure: LAPAROSCOPIC REVISION VENTRICULAR-PERITONEAL (V-P) SHUNT;  Surgeon: [name], MD;  Location: [place];  Service: General;  Laterality: N/A;  ? RADIOLOGY WITH ANESTHESIA N/A 01/30/2020  ? Procedure: IR WITH ANESTHESIA;  Surgeon: [name], MD;  Location: [place];  Service: Radiology;  Laterality: N/A;  ? THYROIDECTOMY N/A 02/08/2020  ? Procedure: THYROIDECTOMY;  Surgeon: [name], MD;  Location: [place];  Service: ENT;  Laterality: N/A;  ? TRACHEOSTOMY TUBE PLACEMENT N/A 02/08/2020  ? Procedure: TRACHEOSTOMY;  Surgeon: [name], MD;  Location: [place];  Service: ENT;  Laterality: N/A;  ? TRACHEOSTOMY TUBE PLACEMENT N/A 02/29/2020  ? Procedure: TRACHEOSTOMY EXCHANGE;  Surgeon: [name], MD;  Location: [place];  Service: ENT;  Laterality: N/A;  ? VENTRICULOPERITONEAL SHUNT N/A 03/18/2020  ? Procedure: RIGHT SHUNT INSERTION VENTRICULAR-PERITONEAL/ BURR HOLE Evacuation of Subdural Hematoma;  Surgeon: [name], MD;  Location: [place];  Service: Neurosurgery;  Laterality: N/A;  ? ? ?History reviewed. No pertinent family history. ? ?Current Outpatient Medications on File Prior to Visit  ?Medication Sig Dispense Refill  ?  acetaminophen (TYLENOL) 325 MG tablet Place 2 tablets (650 mg total) into feeding tube every 6 (six) hours as needed for mild pain.    ? ALPRAZolam (XANAX) 0.5 MG tablet Take 0.5 mg by mouth 3 (three) times daily as needed.    ? amLODipine (NORVASC) 5 MG tablet Place 1 tablet (5 mg  total) into feeding tube daily.    ? anastrozole (ARIMIDEX) 1 MG tablet Take 1 tablet (1 mg total) by mouth daily. 30 tablet 6  ? chlorhexidine (PERIDEX) 0.12 % solution 15 mLs by Mouth Rinse route 2 (two) times daily. 120 mL 0  ? DULoxetine (CYMBALTA) 20 MG capsule Take 20 mg by mouth daily.    ? fluticasone (FLONASE) 50 MCG/ACT nasal spray Place 1 spray into both nostrils 2 (two) times daily.  2  ? glycopyrrolate (ROBINUL) 1 MG tablet Take 1 tablet (1 mg total) by mouth 2 (two) times daily. (Patient taking differently: Place 1 mg into feeding tube 2 (two) times daily.)    ? guaiFENesin (ROBITUSSIN) 100 MG/5ML liquid Place 10 mLs into feeding tube every 8 (eight) hours. 120 mL 0  ? ipratropium-albuterol (DUONEB) 0.5-2.5 (3) MG/3ML SOLN Take 3 mLs by nebulization every 4 (four) hours as needed. 360 mL   ? levETIRAcetam (KEPPRA) 100 MG/ML solution Place 7.5 mLs (750 mg total) into feeding tube 2 (two) times daily. 473 mL 12  ? levothyroxine (SYNTHROID) 100 MCG tablet Place 1 tablet (100 mcg total) into feeding tube daily at 6 (six) AM. 30 tablet 3  ? Nutritional Supplements (FEEDING SUPPLEMENT, OSMOLITE 1.2 CAL,) LIQD Place 355 mLs into feeding tube 5 (five) times daily.  0  ? polyethylene glycol (MIRALAX / GLYCOLAX) 17 g packet Place 17 g into feeding tube 2 (two) times daily. 14 each 0  ? senna (SENOKOT) 8.6 MG TABS tablet Place 1 tablet (8.6 mg total) into feeding tube daily. 120 tablet 0  ? Water For Irrigation, Sterile (FREE WATER) SOLN Place 150 mLs into feeding tube every 6 (six) hours.    ? QUEtiapine (SEROQUEL) 50 MG tablet Place 1 tablet (50 mg total) into feeding tube 2 (two) times daily. 60 tablet 0  ? [DISCONTINUED] hydrALAZINE (APRESOLINE) 50 MG tablet Place 1 tablet (50 mg total) into feeding tube every 8 (eight) hours. 90 tablet 6  ? ?No current facility-administered medications on file prior to visit.  ? ? ?No Known Allergies ? ?Social History  ? ?Substance and Sexual Activity  ?Alcohol Use Not  Currently  ? ? ?Social History  ? ?Tobacco Use  ?Smoking Status Former  ?Smokeless Tobacco Never  ? ? ?Review of Systems  ?Constitutional: Negative.   ?HENT: Negative.    ?Eyes: Negative.   ?Respiratory:  Positive for wheezing.   ?Cardiovascular: Negative.   ?Gastrointestinal: Negative.   ?Genitourinary: Negative.   ?Musculoskeletal: Negative.   ?Skin: Negative.   ?Neurological: Negative.   ?Endo/Heme/Allergies: Negative.   ?Psychiatric/Behavioral: Negative.    ? ?Objective  ? ?Vitals:  ? 07/14/21 1500  ?BP: (!) 149/88  ?Pulse: 90  ?Resp: (!) 24  ?Temp: 98.2 ?F (36.8 ?C)  ?SpO2: 97%  ? ? ?Physical Exam ?Vitals reviewed. Exam conducted with a chaperone present.  ?Constitutional:   ?   Appearance: Normal appearance. She is not ill-appearing.  ?   Comments: Patient ambulates with walker.  ?HENT:  ?   Head: Normocephalic and atraumatic.  ?Cardiovascular:  ?   Rate and Rhythm: Normal rate and regular rhythm.  ?   Heart sounds:  Normal heart sounds. No murmur heard. ?  No friction rub. No gallop.  ?Pulmonary:  ?   Effort: Pulmonary effort is normal. No respiratory distress.  ?   Breath sounds: No stridor. Wheezing present. No rhonchi or rales.  ?   Comments: Tracheostomy in place with oxygen supplementation ?Skin: ?   General: Skin is warm and dry.  ?Neurological:  ?   Mental Status: She is alert and oriented to person, place, and time.  ?Breast: No dominant mass, nipple discharge, or dimpling in the right breast.  Axilla negative for palpable nodes.  Left breast reveals a dominant greater than 4 to 5 cm mass in the upper, outer quadrant of the left breast with palpable axillary lymph nodes.  No nipple discharge is noted. ? ?Dr. Tomie China notes reviewed, recent admission notes reviewed ?Assessment  ?Newly diagnosed left breast cancer with metastatic disease to the left axilla, ER/PR positive ?Multiple comorbidities including recent idiopathic cardiac arrest, mild increase in hydrocephalus, tracheostomy dependence with  oxygen supplementation ?Plan  ?Patient is not a surgical candidate due to her multiple comorbidities.  She is at high risk for cardiopulmonary difficulties as she would need to undergo general anesthesia.  I di

## 2021-07-31 ENCOUNTER — Ambulatory Visit (HOSPITAL_COMMUNITY)
Admission: RE | Admit: 2021-07-31 | Discharge: 2021-07-31 | Disposition: A | Discharge status: Home / Self Care | Payer: Medicaid Other | Source: Ambulatory Visit | Attending: Acute Care | Admitting: Acute Care

## 2021-07-31 DIAGNOSIS — J3800 Paralysis of vocal cords and larynx, unspecified: Secondary | ICD-10-CM | POA: Insufficient documentation

## 2021-07-31 DIAGNOSIS — Z930 Tracheostomy status: Secondary | ICD-10-CM

## 2021-07-31 DIAGNOSIS — Z8679 Personal history of other diseases of the circulatory system: Secondary | ICD-10-CM | POA: Insufficient documentation

## 2021-07-31 DIAGNOSIS — Z430 Encounter for attention to tracheostomy: Secondary | ICD-10-CM | POA: Insufficient documentation

## 2021-07-31 NOTE — Progress Notes (Signed)
?  ? ?  Reason for visit  ?Trach change  ? ?HPI ?66 year old female known to me. It's been about 2 years since I've seen her. 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 She has been trach dependent since that time (2021) which is when I saw her last and noted that she had improved however I was reluctant to decannulate her due to excessive airway secretions and my concern about aspiration. Over the last two years review of medical records see she has been seen by ENT Her most recent note dated in September 2022 when she was seen by Dr. Constance Holster who specifically noted her to have 2 problems #1 paresis or paralysis of the vocal cords and #2 very poor sensation of the hypopharynx and larynx he raise concern still at that time about aspiration risk.  She returns today to get reestablished at the tracheostomy clinic and for routine tracheostomy change ? ?Review of Systems  ?Constitutional:  Negative for chills, fever and weight loss.  ?HENT: Negative.    ?Eyes: Negative.   ?Respiratory:  Positive for cough and sputum production.   ?     Continues to have thick frothy secretions still requiring suctioning approximately 2-3 times a day usually in the morning and before bed she reports  ?Cardiovascular: Negative.   ?Gastrointestinal: Negative.   ?Genitourinary: Negative.   ?Musculoskeletal: Negative.   ?Skin: Negative.   ?Neurological: Negative.   ?Endo/Heme/Allergies: Negative.   ?Psychiatric/Behavioral: Negative.    ? ?Exam  ?General this is a pleasant 66 year old female she presents to the tracheostomy clinic ambulating with a walker she is in no acute distress ?HEENT normocephalic atraumatic has size 4 tracheostomy, her phonation is poor and airy, difficult to understand at times with significant secretions ?Pulmonary scattered rhonchi no accessory use ?Cardiac regular rate and rhythm ?Abdomen soft not tender ?Neuro  intact ? ?Procedure ?The existing tracheostomy was removed, tracheostomy stoma examined this was unremarkable using obturator a new tracheostomy was placed without difficulty end-tidal CO2 confirmed, tracheostomy stabilized with tracheostomy tie ? ?Impression/plan ?Tracheostomy dependence ?Vocal cord paralysis ?Ineffective airway clearance ? ?Discussion ?Marcene is done well over the last 2 years her physical and neurological recovery has been quite exceptional.  Unfortunately she still seems to have trouble protecting her airway with copious tracheal secretions and still requires suction.  Because she also has vocal cord paralysis and poor sensation of her hypopharynx and larynx she still remains a fairly high aspiration risk.  I do not believe at this point she is safe to decannulate ? ?Plan ?Continue routine trach care ?I will see her again in 12 weeks for routine trach change ?We will continue to encourage Passy-Muir valve during the day, will remove this at night ?We could potentially consider capping trials, however I I would like to speak to Dr. Constance Holster about her first, as I really do not think decannulation is going to be a safe option for her and should be considered in this I would need his assistance ? ?My time 32 min  ? ?Erick Colace ACNP-BC ?[place] ?Pager # [phone] OR # [phone] if no answer ? ? ?

## 2021-07-31 NOTE — Progress Notes (Signed)
Tracheostomy Procedure Note ? ?Stacy Moran ?[id number] ?[birth year] ? ?Pre Procedure Tracheostomy Information ? ?Trach BrandCristy Hilts ?Size:  4.0 4UN  65R ?Style: Uncuffed ?Secured by: Velcro ? ? ?Procedure: Trach change and trach cleaning ? ? ? ?Post Procedure Tracheostomy Information ? ?Trach BrandCristy Hilts ?Size:  4.0 4UN65R ?Style: Uncuffed ?Secured by: Velcro ? ? ?Post Procedure Evaluation: ? ?ETCO2 positive color change from yellow to purple : Yes.   ?Vital signs:VSS ?Patients current condition: stable ?Complications: No apparent complications ?Trach site exam: clean, dry ?Wound care done: 4 x 4 gauze drain ?Patient did tolerate procedure well. ? ? ?Education: ?none ? ?Prescription needs: ?none ? ? ? ?Additional needs: ?none ? ? ? ? ? ? ?  ?

## 2021-08-05 ENCOUNTER — Encounter: Payer: Self-pay | Admitting: Licensed Clinical Social Worker

## 2021-08-05 NOTE — Progress Notes (Deleted)
REFERRING PROVIDER: Derek Jack, MD [address] [place],  [place] [address]  PRIMARY PROVIDER:  Everlena Cooper, NP  PRIMARY REASON FOR VISIT:  1. Malignant neoplasm of left female breast, unspecified estrogen receptor status, unspecified site of breast ([place])   2. Family history of breast cancer   3. Family history of ovarian cancer   4. Family history of lung cancer      HISTORY OF PRESENT ILLNESS:   Stacy Moran, a 66 y.o. female, was seen for a [place] cancer genetics consultation at the request of Dr. Delton Coombes due to a personal and family history of cancer.  Stacy Moran presents to clinic today to discuss the possibility of a hereditary predisposition to cancer, genetic testing, and to further clarify her future cancer risks, as well as potential cancer risks for family members.   In 2023, at the age of 38, Stacy Moran was diagnosed with [facility] of the left breast. The treatment plan currently includes anastrozole ***.    CANCER HISTORY:  Oncology History   No history exists.     RISK FACTORS:  Menarche was at age ***.  First live birth at age ***.  OCP use for approximately {Numbers 1-12 multi-select:20307} years.  Ovaries intact: {Yes/No-Ex:120004}.  Hysterectomy: {Yes/No-Ex:120004}.  Menopausal status: {Menopause:31378}.  HRT use: {Numbers 1-12 multi-select:20307} years. Colonoscopy: {Yes/No-Ex:120004}; {normal/abnormal/not examined:14677}. Mammogram within the last year: {Yes/No-Ex:120004}. Number of breast biopsies: {Numbers 1-12 multi-select:20307}. Up to date with pelvic exams: {Yes/No-Ex:120004}. Any excessive radiation exposure in the past: {Yes/No-Ex:120004}  Past Medical History:  Diagnosis Date   Acute respiratory failure ([place])    Hypertension    Prosthetic eye globe    Ruptured aneurysm of artery (HCC)    Seizures (HCC)    Status post insertion of percutaneous endoscopic gastrostomy (PEG) tube ([place])    Stroke ([place])    Subdural hematoma ([place])     Tracheostomy dependent ([place])    Tracheostomy present [facility])     Past Surgical History:  Procedure Laterality Date   BURR HOLE N/A 03/18/2020   Procedure: Haskell Flirt;  Surgeon: [name], MD;  Location: [place];  Service: Neurosurgery;  Laterality: N/A;   CRANIOTOMY Left 01/30/2020   Procedure: LEFT FAR LATERAL CRANIOTOMY FOR ANEURYSM CLIPPING;  Surgeon: [name], MD;  Location: [place];  Service: Neurosurgery;  Laterality: Left;   DIRECT LARYNGOSCOPY N/A 02/29/2020   Procedure: DIRECT LARYNGOSCOPY;  Surgeon: [name], MD;  Location: [place];  Service: ENT;  Laterality: N/A;   DIRECT LARYNGOSCOPY N/A 10/03/2020   Procedure: DIRECT LARYNGOSCOPY w/esophagascopy;  Surgeon: [name], MD;  Location: [place];  Service: ENT;  Laterality: N/A;   INTUBATION-ENDOTRACHEAL WITH TRACHEOSTOMY STANDBY N/A 12/14/2020   Procedure: Warrick Parisian WITH TRACHEOSTOMY STANDBY;  Surgeon: Carloyn Manner, MD;  Location: [facility];  Service: ENT;  Laterality: N/A;   IR ANGIO INTRA EXTRACRAN SEL INTERNAL CAROTID BILAT MOD SED  01/30/2020   IR ANGIO VERTEBRAL SEL VERTEBRAL UNI L MOD SED  01/30/2020   IR CM INJ ANY COLONIC TUBE W/FLUORO  07/03/2020   IR GASTROSTOMY TUBE MOD SED  02/22/2020   IR [place] GASTRO/COLONIC TUBE PERCUT W/FLUORO  07/03/2020   LAPAROSCOPIC REVISION VENTRICULAR-PERITONEAL (V-P) SHUNT N/A 03/18/2020   Procedure: LAPAROSCOPIC REVISION VENTRICULAR-PERITONEAL (V-P) SHUNT;  Surgeon: [name], MD;  Location: [place];  Service: General;  Laterality: N/A;   RADIOLOGY WITH ANESTHESIA N/A 01/30/2020   Procedure: IR WITH ANESTHESIA;  Surgeon: [name], MD;  Location: [place];  Service: Radiology;  Laterality:  N/A;   THYROIDECTOMY N/A 02/08/2020   Procedure: THYROIDECTOMY;  Surgeon: [name], MD;  Location: [place];  Service: ENT;  Laterality: N/A;   TRACHEOSTOMY TUBE PLACEMENT N/A 02/08/2020   Procedure: TRACHEOSTOMY;  Surgeon: [name], MD;  Location: [facility];  Service:  ENT;  Laterality: N/A;   TRACHEOSTOMY TUBE PLACEMENT N/A 02/29/2020   Procedure: TRACHEOSTOMY EXCHANGE;  Surgeon: [name], MD;  Location: [place];  Service: ENT;  Laterality: N/A;   VENTRICULOPERITONEAL SHUNT N/A 03/18/2020   Procedure: RIGHT SHUNT INSERTION VENTRICULAR-PERITONEAL/ BURR HOLE Evacuation of Subdural Hematoma;  Surgeon: [name], MD;  Location: [place];  Service: Neurosurgery;  Laterality: N/A;     FAMILY HISTORY:  We obtained a detailed, 4-generation family history.  Significant diagnoses are listed below: Family History  Problem Relation Age of Onset   Breast cancer Sister    Ovarian cancer Sister    Lung cancer Maternal Grandmother    Lung cancer Daughter     Ms. Biehler is unaware of previous family history of genetic testing for hereditary cancer risks. There is no reported Ashkenazi Jewish ancestry. There is no known consanguinity.  GENETIC COUNSELING ASSESSMENT: Stacy Moran is a 66 y.o. female with a personal and family history of breast cancer which is somewhat suggestive of a hereditary cancer syndrome and predisposition to cancer. We, therefore, discussed and recommended the following at today's visit.   DISCUSSION: We discussed that approximately 10% of breast cancer is hereditary. Most cases of hereditary breast cancer are associated with BRCA1/BRCA2 genes. There are other genes associated with hereditary cancer as well, including genes associated with ovarian cancer. Cancers and risks are gene specific. We discussed that testing is beneficial for several reasons including knowing about cancer risks, identifying potential screening and risk-reduction options that may be appropriate, and to understand if other family members could be at risk for cancer and allow them to undergo genetic testing.   We reviewed the characteristics, features and inheritance patterns of hereditary cancer syndromes. We also discussed genetic testing, including the appropriate  family members to test, the process of testing, insurance coverage and turn-around-time for results. We discussed the implications of a negative, positive and/or variant of uncertain significant result. We recommended Stacy Moran pursue genetic testing for the [facility] *** gene panel.   Based on Stacy Moran's personal and family history of cancer, she meets medical criteria for genetic testing. Despite that she meets criteria, she may still have an out of pocket cost. We discussed that if her out of pocket cost for testing is over $100, the laboratory will call and confirm whether she wants to proceed with testing.  If the out of pocket cost of testing is less than $100 she will be billed by the genetic testing laboratory.   PLAN: After considering the risks, benefits, and limitations, Stacy Moran provided informed consent to pursue genetic testing and the blood sample was sent to [facility] for analysis of the {test}. Results should be available within approximately 2-3 weeks' time, at which point they will be disclosed by telephone to Stacy Moran, as will any additional recommendations warranted by these results. Stacy Moran will receive a summary of her genetic counseling visit and a copy of her results once available. This information will also be available in Epic.   *** Despite our recommendation, Stacy Moran did not wish to pursue genetic testing at today's visit. We understand this decision and remain available to coordinate genetic testing at any time in the future.  We, therefore, recommend Stacy Moran continue to follow the cancer screening guidelines given by her primary healthcare provider.  Stacy Moran's questions were answered to her satisfaction today. Our contact information was provided should additional questions or concerns arise. Thank you for the referral and allowing [place] to share in the care of your patient.   Faith Rogue, MS, [facility] Genetic  Counselor [email or web address] Phone: [phone]  The patient was seen for a total of *** minutes in face-to-face genetic counseling.  Dr. Grayland Ormond was available for discussion regarding this case.   _______________________________________________________________________ For Office Staff:  Number of people involved in session: *** Was an Intern/ student involved with case: no

## 2021-08-06 ENCOUNTER — Inpatient Hospital Stay (HOSPITAL_COMMUNITY): Payer: Medicaid Other | Attending: Hematology | Admitting: Licensed Clinical Social Worker

## 2021-08-06 DIAGNOSIS — Z8673 Personal history of transient ischemic attack (TIA), and cerebral infarction without residual deficits: Secondary | ICD-10-CM | POA: Insufficient documentation

## 2021-08-06 DIAGNOSIS — C50912 Malignant neoplasm of unspecified site of left female breast: Secondary | ICD-10-CM

## 2021-08-06 DIAGNOSIS — Z8041 Family history of malignant neoplasm of ovary: Secondary | ICD-10-CM

## 2021-08-06 DIAGNOSIS — C50412 Malignant neoplasm of upper-outer quadrant of left female breast: Secondary | ICD-10-CM | POA: Insufficient documentation

## 2021-08-06 DIAGNOSIS — Z801 Family history of malignant neoplasm of trachea, bronchus and lung: Secondary | ICD-10-CM

## 2021-08-06 DIAGNOSIS — Z803 Family history of malignant neoplasm of breast: Secondary | ICD-10-CM

## 2021-08-06 DIAGNOSIS — Z79899 Other long term (current) drug therapy: Secondary | ICD-10-CM | POA: Insufficient documentation

## 2021-08-06 DIAGNOSIS — Z170 Estrogen receptor positive status [ER+]: Secondary | ICD-10-CM | POA: Insufficient documentation

## 2021-08-11 ENCOUNTER — Ambulatory Visit (HOSPITAL_COMMUNITY): Payer: Medicaid Other | Admitting: Hematology

## 2021-08-19 ENCOUNTER — Other Ambulatory Visit (HOSPITAL_COMMUNITY): Payer: Self-pay | Admitting: Hematology

## 2021-08-19 ENCOUNTER — Inpatient Hospital Stay (HOSPITAL_BASED_OUTPATIENT_CLINIC_OR_DEPARTMENT_OTHER): Payer: Medicaid Other | Admitting: Hematology

## 2021-08-19 VITALS — BP 149/93 | HR 106 | Temp 98.3°F | Resp 18 | Ht 66.0 in | Wt 141.9 lb

## 2021-08-19 DIAGNOSIS — N6320 Unspecified lump in the left breast, unspecified quadrant: Secondary | ICD-10-CM

## 2021-08-19 DIAGNOSIS — Z79899 Other long term (current) drug therapy: Secondary | ICD-10-CM | POA: Diagnosis not present

## 2021-08-19 DIAGNOSIS — Z170 Estrogen receptor positive status [ER+]: Secondary | ICD-10-CM | POA: Diagnosis not present

## 2021-08-19 DIAGNOSIS — C50412 Malignant neoplasm of upper-outer quadrant of left female breast: Secondary | ICD-10-CM | POA: Diagnosis present

## 2021-08-19 DIAGNOSIS — Z8673 Personal history of transient ischemic attack (TIA), and cerebral infarction without residual deficits: Secondary | ICD-10-CM | POA: Diagnosis not present

## 2021-08-19 NOTE — Progress Notes (Signed)
[facility] [address], [place] [address]   Patient Care Team: Everlena Cooper, NP as PCP - General (Family Medicine)  SUMMARY OF ONCOLOGIC HISTORY: Oncology History   No history exists.    CHIEF COMPLIANT: Follow-up of left breast mass with axillary lymphadenopathy   INTERVAL HISTORY: [name] is a 66 y.o. female here today for follow up of her left breast mass with axillary lymphadenopathy. Her last visit was on 06/29/2021.   Today she reports feeling good, and she is accompanied by her daughter. She has started anastrozole and reports tolerating it well without hot flashes or new aches.   REVIEW OF SYSTEMS:   Review of Systems  Constitutional:  Negative for appetite change and fatigue.  Respiratory:  Positive for shortness of breath.   Gastrointestinal:  Positive for diarrhea.  Endocrine: Negative for hot flashes.  Musculoskeletal:  Negative for arthralgias.  All other systems reviewed and are negative.  I have reviewed the past medical history, past surgical history, social history and family history with the patient and they are unchanged from previous note.   ALLERGIES:   has No Known Allergies.   MEDICATIONS:  Current Outpatient Medications  Medication Sig Dispense Refill   acetaminophen (TYLENOL) 325 MG tablet Place 2 tablets (650 mg total) into feeding tube every 6 (six) hours as needed for mild pain.     ALPRAZolam (XANAX) 0.5 MG tablet Take 0.5 mg by mouth 3 (three) times daily as needed.     amLODipine (NORVASC) 5 MG tablet Place 1 tablet (5 mg total) into feeding tube daily.     anastrozole (ARIMIDEX) 1 MG tablet Take 1 tablet (1 mg total) by mouth daily. 30 tablet 6   chlorhexidine (PERIDEX) 0.12 % solution 15 mLs by Mouth Rinse route 2 (two) times daily. 120 mL 0   DULoxetine (CYMBALTA) 20 MG capsule Take 20 mg by mouth daily.     fluticasone (FLONASE) 50 MCG/ACT nasal spray Place 1 spray into both nostrils 2 (two) times  daily.  2   glycopyrrolate (ROBINUL) 1 MG tablet Take 1 tablet (1 mg total) by mouth 2 (two) times daily. (Patient taking differently: Place 1 mg into feeding tube 2 (two) times daily.)     guaiFENesin (ROBITUSSIN) 100 MG/5ML liquid Place 10 mLs into feeding tube every 8 (eight) hours. 120 mL 0   ipratropium-albuterol (DUONEB) 0.5-2.5 (3) MG/3ML SOLN Take 3 mLs by nebulization every 4 (four) hours as needed. 360 mL    levETIRAcetam (KEPPRA) 100 MG/ML solution Place 7.5 mLs (750 mg total) into feeding tube 2 (two) times daily. 473 mL 12   levothyroxine (SYNTHROID) 100 MCG tablet Place 1 tablet (100 mcg total) into feeding tube daily at 6 (six) AM. 30 tablet 3   Nutritional Supplements (FEEDING SUPPLEMENT, OSMOLITE 1.2 CAL,) LIQD Place 355 mLs into feeding tube 5 (five) times daily.  0   polyethylene glycol (MIRALAX / GLYCOLAX) 17 g packet Place 17 g into feeding tube 2 (two) times daily. 14 each 0   senna (SENOKOT) 8.6 MG TABS tablet Place 1 tablet (8.6 mg total) into feeding tube daily. 120 tablet 0   Water For Irrigation, Sterile (FREE WATER) SOLN Place 150 mLs into feeding tube every 6 (six) hours.     QUEtiapine (SEROQUEL) 50 MG tablet Place 1 tablet (50 mg total) into feeding tube 2 (two) times daily. 60 tablet 0   No current facility-administered medications for this visit.     PHYSICAL  EXAMINATION: Performance status (ECOG): 3 - Symptomatic, >50% confined to bed  Vitals:   08/19/21 1038  BP: (!) 149/93  Pulse: (!) 106  Resp: 18  Temp: 98.3 F (36.8 C)  SpO2: 100%   Wt Readings from Last 3 Encounters:  08/19/21 141 lb 14.4 oz (64.4 kg)  07/14/21 144 lb 6.4 oz (65.5 kg)  06/29/21 140 lb 10.5 oz (63.8 kg)   Physical Exam Vitals reviewed.  Constitutional:      Appearance: Normal appearance.  Cardiovascular:     Rate and Rhythm: Normal rate and regular rhythm.     Pulses: Normal pulses.     Heart sounds: Normal heart sounds.  Pulmonary:     Effort: Pulmonary effort is  normal.     Breath sounds: Normal breath sounds.  Neurological:     General: No focal deficit present.     Mental Status: She is alert and oriented to person, place, and time.  Psychiatric:        Mood and Affect: Mood normal.        Behavior: Behavior normal.    Breast Exam Chaperone: Thana Ates     LABORATORY DATA:  I have reviewed the data as listed    Latest Ref Rng & Units 06/02/2021    4:33 AM 06/01/2021   10:17 AM 05/24/2021    8:36 PM  CMP  Glucose 70 - 99 mg/dL 84   223   110    BUN 8 - 23 mg/dL 17   23   24     Creatinine 0.44 - 1.00 mg/dL 0.55   0.76   0.55    Sodium 135 - 145 mmol/L 143   138   140    Potassium 3.5 - 5.1 mmol/L 4.0   3.4   4.1    Chloride 98 - 111 mmol/L 106   100   105    CO2 22 - 32 mmol/L 28   27   30     Calcium 8.9 - 10.3 mg/dL 9.7   9.4   9.5    Total Protein 6.5 - 8.1 g/dL  7.9     Total Bilirubin 0.3 - 1.2 mg/dL  0.2     Alkaline Phos 38 - 126 U/L  71     AST 15 - 41 U/L  58     ALT 0 - 44 U/L  54      No results found for: [id number] Lab Results  Component Value Date   WBC 6.1 06/02/2021   HGB 13.3 06/02/2021   HCT 41.8 06/02/2021   MCV 82.1 06/02/2021   PLT 141 (L) 06/02/2021   NEUTROABS 2.1 06/01/2021    ASSESSMENT:  1.  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 - Left breast biopsy on 06/16/2021 - Pathology: Invasive ductal carcinoma, grade 2/3, ER 80% positive, weak to moderate staining, PR 30% positive, moderate staining, Ki-67-1%.  HER2 was 2+ by IHC and negative by FISH. - PET scan on 2/45/8099: Hypermetabolic left breast mass and enlarged hypermetabolic left axillary lymph nodes extending to the subpectoral nodal station.  Skin thickening in the left axilla.  No distant metastatic disease. - Anastrozole started on 06/29/2021. - She was evaluated by Dr. Arnoldo Morale and immediate surgery was not  recommended.  2.  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  3.  Social/family history: - She used to live with her daughter prior to recent placement at [facility] since June/July of 2022.  She used to work at [employer].  Quit smoking 2 years ago.  Smoked half pack per day for 45 years. - Sister had breast cancer.  Daughter had lung cancer.  Maternal grandmother had lung cancer.  Another sister had ovarian cancer.   PLAN:  1.  Stage IIIb (T2 N3 M0) left breast IDC, ER/PR positive and HER2 negative: - Physical exam done previously showed 5 cm left breast mass in the upper outer quadrant with left axillary lymph node palpable. - She was evaluated by Dr. Arnoldo Morale and surgery was not felt to be immediate safe option. - She is tolerating anastrozole reasonably well via PEG tube. - No major arthralgias or hot flashes reported. - I have recommended continuing anastrozole for 4 more months with repeat left ultrasound of the breast and axilla.  We will also check labs and tumor markers along with vitamin D level.  2.  Bone health: - DEXA scan on 07/06/2021 with T score -0.1. - Continue calcium and vitamin D supplements.  Breast Cancer therapy associated bone loss: I have recommended calcium, Vitamin D and weight bearing exercises.  Orders placed this encounter:  Orders Placed This Encounter  Procedures   US Breast Complete [place]   CBC with Differential   Comprehensive metabolic panel   Cancer antigen 15-3   VITAMIN D 25 Hydroxy (Vit-D Deficiency, Fractures)    The patient has a good understanding of the overall plan. She agrees with it. She will call with any problems that may develop before the next visit here.  Derek Jack, MD [facility] [phone]   I, Thana Ates, am acting as a scribe for Dr. Derek Jack.  I, Derek Jack MD, have reviewed the above  documentation for accuracy and completeness, and I agree with the above.

## 2021-08-19 NOTE — Patient Instructions (Addendum)
[facility] at [facility] Discharge Instructions   You were seen and examined today by Dr. Delton Coombes.  Continue taking Arimidex as prescribed.   We will repeat an ultrasound of your left breast and lab work prior to your next visit.   Return as scheduled in 4 months.    Thank you for choosing [place] at [facility] to provide your oncology and hematology care.  To afford each patient quality time with our provider, please arrive at least 15 minutes before your scheduled appointment time.   If you have a lab appointment with the [facility] please come in thru the Main Entrance and check in at the main information desk.  You need to re-schedule your appointment should you arrive 10 or more minutes late.  We strive to give you quality time with our providers, and arriving late affects you and other patients whose appointments are after yours.  Also, if you no show three or more times for appointments you may be dismissed from the clinic at the providers discretion.     Again, thank you for choosing [facility]  Our hope is that these requests will decrease the amount of time that you wait before being seen by our physicians.       _____________________________________________________________  Should you have questions after your visit to [facility], please contact our office at [phone] and follow the prompts.  Our office hours are 8:00 a.m. and 4:30 p.m. Monday - Friday.  Please note that voicemails left after 4:00 p.m. may not be returned until the following business day.  We are closed weekends and major holidays.  You do have access to a nurse 24-7, just call the main number to the clinic [phone] and do not press any options, hold on the line and a nurse will answer the phone.    For prescription refill requests, have your pharmacy contact our office and allow 72 hours.    Due to Covid, you will need  to wear a mask upon entering the hospital. If you do not have a mask, a mask will be given to you at the Main Entrance upon arrival. For doctor visits, patients may have 1 support person age 31 or older with them. For treatment visits, patients can not have anyone with them due to social distancing guidelines and our immunocompromised population.

## 2021-10-23 ENCOUNTER — Ambulatory Visit (HOSPITAL_COMMUNITY)
Admission: RE | Admit: 2021-10-23 | Discharge: 2021-10-23 | Disposition: A | Discharge status: Home / Self Care | Payer: Medicaid Other | Source: Ambulatory Visit | Attending: Acute Care | Admitting: Acute Care

## 2021-10-23 ENCOUNTER — Telehealth: Payer: Self-pay | Admitting: Acute Care

## 2021-10-23 DIAGNOSIS — J3800 Paralysis of vocal cords and larynx, unspecified: Secondary | ICD-10-CM | POA: Diagnosis not present

## 2021-10-23 DIAGNOSIS — Z930 Tracheostomy status: Secondary | ICD-10-CM

## 2021-10-23 DIAGNOSIS — J398 Other specified diseases of upper respiratory tract: Secondary | ICD-10-CM

## 2021-10-23 DIAGNOSIS — M7989 Other specified soft tissue disorders: Secondary | ICD-10-CM | POA: Diagnosis not present

## 2021-10-23 DIAGNOSIS — I609 Nontraumatic subarachnoid hemorrhage, unspecified: Secondary | ICD-10-CM | POA: Diagnosis not present

## 2021-10-23 NOTE — Progress Notes (Signed)
Reason for visit Planned trach change  HPI 66 year old female who I saw last in May this year. Prior to that she had been lost to follow up during COVID times. 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 She ultimately had thyroidectomy and trach placed by ENT.  Last seen by ENT sept 2022 at which time she still had paresis/paralysis of the vocal cords and poor sensation of hypopharynx and larynx. It was felt at that time she was a very high risk for extubation. When I saw her in May to get her re-established in the clinic I was pleased to see the degree of her neurological recovery. I did note she still had sig difficulty w/ copious tracheal secretions. I recommended that she use her PMV during the day as much as possible and that I would consider capping trials after I spoke w/ Dr Constance Holster.  She presents today for planned f/u.   ROS Review of Systems  Constitutional:  Negative for fever, malaise/fatigue and weight loss.  HENT:  Positive for congestion.   Eyes: Negative.   Respiratory:  Positive for cough, sputum production and shortness of breath.   Cardiovascular:  Positive for leg swelling. Negative for palpitations.  Gastrointestinal: Negative.   Genitourinary: Negative.   Musculoskeletal: Negative.   Skin: Negative.   Neurological: Negative.   Endo/Heme/Allergies: Negative.     Exam  General 66 year old female. Resting in chair, no distress still has sig trach secretions HENT NCAT phonation poor but audible. Still has very rhonchus upper airway noises.  Pulm scattered rhonchi  Card rrr Abd soft Ext warm and dry  Neuro awake and oriented.   Procedure Current trach removed. Site inspected. Unremarkable at this point. New size 4 cuffless trach placed. Positioning confirmed via ETCO2. 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 I don not think given her sig trach secretions and dysphonia she is a good candidate to go ahead w/ decannulation. I have updated daughter.   Plan Cont routine trach care Will as ENT to see her to re-eval vocal cord paresis ROV 12 weeks I updated her daughter via phone   My time 29 minutes.  Erick Colace ACNP-BC [facility] Pager # [phone] OR # [phone] if no answer

## 2021-10-23 NOTE — Telephone Encounter (Signed)
Stacy Moran, I dont think you meant to route this to [facility].

## 2021-10-23 NOTE — Progress Notes (Signed)
Tracheostomy Procedure Note  Stacy Moran [id number] 18-Jul-1955  Pre Procedure Tracheostomy Information  Trach Brand: Shiley Size:  4.0 Style: Uncuffed Secured by: Velcro   Procedure: Trach cleaning and trach change-  Post Procedure Tracheostomy Information  Trach Brand: Shiley Size:  4.0 Style: Uncuffed Secured by: Velcro   Post Procedure Evaluation:  ETCO2 positive color change from yellow to purple : Yes.   Vital signs:VSS Patients current condition: stable Complications: No apparent complications Trach site exam: clean, dry Wound care done: 4 x 4 gauze drain Patient did tolerate procedure well.   Education: none  Prescription needs: none    Additional needs: none

## 2021-11-02 NOTE — Telephone Encounter (Signed)
Will route this to triage for referral to be placed.

## 2021-11-02 NOTE — Telephone Encounter (Signed)
Forwarding to [facility] per Pete's request. Adding Laurey Arrow too so he can keep up with this.

## 2021-11-02 NOTE — Telephone Encounter (Signed)
Referral placed per Dr Kary Kos. Nothing further needed

## 2021-12-01 ENCOUNTER — Other Ambulatory Visit: Payer: Self-pay | Admitting: Neurosurgery

## 2021-12-01 DIAGNOSIS — I609 Nontraumatic subarachnoid hemorrhage, unspecified: Secondary | ICD-10-CM

## 2021-12-03 ENCOUNTER — Inpatient Hospital Stay: Admission: RE | Admit: 2021-12-03 | Payer: Medicaid Other | Source: Ambulatory Visit

## 2021-12-05 ENCOUNTER — Emergency Department (HOSPITAL_COMMUNITY)
Admission: EM | Admit: 2021-12-05 | Discharge: 2021-12-06 | Disposition: A | Discharge status: Home / Self Care | Payer: Medicaid Other | Attending: Emergency Medicine | Admitting: Emergency Medicine

## 2021-12-05 ENCOUNTER — Other Ambulatory Visit: Payer: Self-pay

## 2021-12-05 ENCOUNTER — Encounter (HOSPITAL_COMMUNITY): Payer: Self-pay

## 2021-12-05 DIAGNOSIS — R1013 Epigastric pain: Secondary | ICD-10-CM | POA: Insufficient documentation

## 2021-12-05 DIAGNOSIS — K9429 Other complications of gastrostomy: Secondary | ICD-10-CM | POA: Diagnosis present

## 2021-12-05 NOTE — ED Notes (Signed)
Facility called multiple attempts, no answer on all attempts to contact them.

## 2021-12-05 NOTE — ED Notes (Signed)
Dressing applied with abd pad & tape

## 2021-12-05 NOTE — ED Provider Notes (Signed)
[facility] EMERGENCY DEPARTMENT Provider Note   CSN: [id number] Arrival date & time: 12/05/21  1925     History {Add pertinent medical, surgical, social history, OB history to HPI:1} Chief Complaint  Patient presents with   GI Problem    Feeding tube has hole in it    Stacy Moran is a 66 y.o. female.  Patient has a feeding tube that is leaking.   GI Problem       Home Medications Prior to Admission medications   Medication Sig Start Date End Date Taking? Authorizing Provider  acetaminophen (TYLENOL) 325 MG tablet Place 2 tablets (650 mg total) into feeding tube every 6 (six) hours as needed for mild pain. 05/20/20   Angiulli, [name], PA-C  ALPRAZolam Duanne Moron) 0.5 MG tablet Take 0.5 mg by mouth 3 (three) times daily as needed. 04/23/21   [provider]  amLODipine (NORVASC) 5 MG tablet Place 1 tablet (5 mg total) into feeding tube daily. 02/19/21   Nolberto Hanlon, MD  anastrozole (ARIMIDEX) 1 MG tablet Take 1 tablet (1 mg total) by mouth daily. 06/29/21   Derek Jack, MD  chlorhexidine (PERIDEX) 0.12 % solution 15 mLs by Mouth Rinse route 2 (two) times daily. 10/07/20   [name], DO  DULoxetine (CYMBALTA) 20 MG capsule Take 20 mg by mouth daily. 05/20/21   [provider]  fluticasone (FLONASE) 50 MCG/ACT nasal spray Place 1 spray into both nostrils 2 (two) times daily. 02/18/21   Nolberto Hanlon, MD  glycopyrrolate (ROBINUL) 1 MG tablet Take 1 tablet (1 mg total) by mouth 2 (two) times daily. Patient taking differently: Place 1 mg into feeding tube 2 (two) times daily. 02/18/21   Nolberto Hanlon, MD  guaiFENesin (ROBITUSSIN) 100 MG/5ML liquid Place 10 mLs into feeding tube every 8 (eight) hours. 02/18/21   Nolberto Hanlon, MD  ipratropium-albuterol (DUONEB) 0.5-2.5 (3) MG/3ML SOLN Take 3 mLs by nebulization every 4 (four) hours as needed. 12/21/20   Bonnielee Haff, MD  levETIRAcetam (KEPPRA) 100 MG/ML solution Place 7.5 mLs (750 mg total) into feeding tube  2 (two) times daily. 10/07/20   [name], DO  levothyroxine (SYNTHROID) 100 MCG tablet Place 1 tablet (100 mcg total) into feeding tube daily at 6 (six) AM. 07/16/20   [name], MD  Nutritional Supplements (FEEDING SUPPLEMENT, OSMOLITE 1.2 CAL,) LIQD Place 355 mLs into feeding tube 5 (five) times daily. 02/18/21   Nolberto Hanlon, MD  polyethylene glycol (MIRALAX / GLYCOLAX) 17 g packet Place 17 g into feeding tube 2 (two) times daily. 10/07/20   [name], DO  QUEtiapine (SEROQUEL) 50 MG tablet Place 1 tablet (50 mg total) into feeding tube 2 (two) times daily. 12/21/20 06/01/21  Bonnielee Haff, MD  senna (SENOKOT) 8.6 MG TABS tablet Place 1 tablet (8.6 mg total) into feeding tube daily. 10/08/20   [name], DO  Water For Irrigation, Sterile (FREE WATER) SOLN Place 150 mLs into feeding tube every 6 (six) hours. 02/18/21   Nolberto Hanlon, MD  hydrALAZINE (APRESOLINE) 50 MG tablet Place 1 tablet (50 mg total) into feeding tube every 8 (eight) hours. 08/09/20 10/07/20  [name], MD      Allergies    Patient has no known allergies.    Review of Systems   Review of Systems  Physical Exam Updated Vital Signs BP (!) 155/86   Pulse 81   Temp 98.3 F (36.8 C) (Oral)   Resp 18   Ht '5\' 6"'$  (1.676 m)  Wt 64.4 kg   SpO2 97%   BMI 22.92 kg/m  Physical Exam  ED Results / Procedures / Treatments   Labs (all labs ordered are listed, but only abnormal results are displayed) Labs Reviewed - No data to display  EKG None  Radiology No results found.  Procedures Procedures  {Document cardiac monitor, telemetry assessment procedure when appropriate:1}  Medications Ordered in ED Medications - No data to display  ED Course/ Medical Decision Making/ A&P  I attempted to remove the feeding tube and we placed a new tube in the patient.  I was unsuccessful at getting the tube out but her old tube was leaking at the beginning of the 2 which is farther from her stomach.  I took  off the connection and cut off about 1 cm of the bed tube and reconnected it.                         Medical Decision Making Leaking feeding tube that has been repaired.  Patient will follow-up as an outpatient with IR to get a new tube  {Document critical care time when appropriate:1} {Document review of labs and clinical decision tools ie heart score, Chads2Vasc2 etc:1}  {Document your independent review of radiology images, and any outside records:1} {Document your discussion with family members, caretakers, and with consultants:1} {Document social determinants of health affecting pt's care:1} {Document your decision making why or why not admission, treatments were needed:1} Final Clinical Impression(s) / ED Diagnoses Final diagnoses:  Epigastric pain    Rx / DC Orders ED Discharge Orders     None

## 2021-12-05 NOTE — ED Triage Notes (Signed)
Pt arrived via EMS from [facility]. EMS reports pt has a hole in her feeding tube and needs it replaced. Pt has trach and wears 4 liters of oxygen.

## 2021-12-05 NOTE — ED Notes (Signed)
[facility] notified of patient needing transportation back to [facility]. 

## 2021-12-05 NOTE — Discharge Instructions (Signed)
You need to have your doctor arrange for you to schedule to get your feeding tube replaced where you got it done in [place] through interventional radiology.  This can be done as an outpatient and scheduled a time that is convenient.  You can use your tube now it has been fixed but is in poor shape and should be replaced

## 2021-12-06 MED ORDER — SENNA 8.6 MG PO TABS: 1.0000 | ORAL_TABLET | Freq: Every day | ORAL | Status: DC

## 2021-12-06 MED ORDER — GUAIFENESIN 100 MG/5ML PO LIQD
10.0000 mL | Freq: Three times a day (TID) | ORAL | Status: DC
  Administered 2021-12-06: 10 mL
  Filled 2021-12-06: qty 10

## 2021-12-06 MED ORDER — ANASTROZOLE 1 MG PO TABS: 1.0000 mg | ORAL_TABLET | Freq: Every day | ORAL | Status: DC

## 2021-12-06 MED ORDER — OSMOLITE 1.2 CAL PO LIQD: 355.0000 mL | Freq: Every day | ORAL | Status: DC

## 2021-12-06 MED ORDER — AMLODIPINE BESYLATE 5 MG PO TABS: 5.0000 mg | ORAL_TABLET | Freq: Every day | ORAL | Status: DC

## 2021-12-06 MED ORDER — ALPRAZOLAM 0.5 MG PO TABS
0.5000 mg | ORAL_TABLET | Freq: Once | ORAL | Status: AC
  Administered 2021-12-06: 0.5 mg via JEJUNOSTOMY
  Filled 2021-12-06: qty 1

## 2021-12-06 MED ORDER — POLYETHYLENE GLYCOL 3350 17 G PO PACK: 17.0000 g | PACK | Freq: Two times a day (BID) | ORAL | Status: DC

## 2021-12-06 MED ORDER — GLYCOPYRROLATE 1 MG PO TABS
1.0000 mg | ORAL_TABLET | Freq: Two times a day (BID) | ORAL | Status: DC
  Filled 2021-12-06 (×3): qty 1

## 2021-12-06 MED ORDER — LEVOTHYROXINE SODIUM 50 MCG PO TABS
100.0000 ug | ORAL_TABLET | Freq: Every day | ORAL | Status: DC
  Administered 2021-12-06: 100 ug
  Filled 2021-12-06: qty 2

## 2021-12-06 MED ORDER — LEVETIRACETAM 100 MG/ML PO SOLN
750.0000 mg | Freq: Two times a day (BID) | ORAL | Status: DC
  Filled 2021-12-06 (×3): qty 7.5

## 2021-12-06 MED ORDER — DULOXETINE HCL 20 MG PO CPEP: 20.0000 mg | ORAL_CAPSULE | Freq: Every day | ORAL | Status: DC

## 2021-12-06 NOTE — ED Notes (Signed)
Facility staff verbalized understanding of discharge instructions. Opportunity for questions provided.

## 2021-12-06 NOTE — ED Notes (Signed)
Pt ambulated to restroom and back w/o assistance.  Pt provided update on delay.

## 2021-12-15 ENCOUNTER — Encounter (HOSPITAL_COMMUNITY): Payer: Self-pay

## 2021-12-15 ENCOUNTER — Inpatient Hospital Stay: Payer: Medicaid Other | Attending: Hematology

## 2021-12-15 ENCOUNTER — Ambulatory Visit (HOSPITAL_COMMUNITY)
Admission: RE | Admit: 2021-12-15 | Discharge: 2021-12-15 | Disposition: A | Discharge status: Home / Self Care | Payer: Medicaid Other | Source: Ambulatory Visit | Attending: Hematology | Admitting: Hematology

## 2021-12-15 DIAGNOSIS — N6320 Unspecified lump in the left breast, unspecified quadrant: Secondary | ICD-10-CM | POA: Insufficient documentation

## 2021-12-15 DIAGNOSIS — Z79811 Long term (current) use of aromatase inhibitors: Secondary | ICD-10-CM | POA: Diagnosis not present

## 2021-12-15 DIAGNOSIS — C50412 Malignant neoplasm of upper-outer quadrant of left female breast: Secondary | ICD-10-CM | POA: Insufficient documentation

## 2021-12-15 DIAGNOSIS — Z801 Family history of malignant neoplasm of trachea, bronchus and lung: Secondary | ICD-10-CM | POA: Diagnosis not present

## 2021-12-15 DIAGNOSIS — Z170 Estrogen receptor positive status [ER+]: Secondary | ICD-10-CM | POA: Diagnosis not present

## 2021-12-15 DIAGNOSIS — Z87891 Personal history of nicotine dependence: Secondary | ICD-10-CM | POA: Insufficient documentation

## 2021-12-15 DIAGNOSIS — Z8041 Family history of malignant neoplasm of ovary: Secondary | ICD-10-CM | POA: Diagnosis not present

## 2021-12-15 DIAGNOSIS — Z8673 Personal history of transient ischemic attack (TIA), and cerebral infarction without residual deficits: Secondary | ICD-10-CM | POA: Insufficient documentation

## 2021-12-15 DIAGNOSIS — Z803 Family history of malignant neoplasm of breast: Secondary | ICD-10-CM | POA: Diagnosis not present

## 2021-12-15 LAB — CBC WITH DIFFERENTIAL/PLATELET
Abs Immature Granulocytes: 0.01 10*3/uL (ref 0.00–0.07)
Basophils Absolute: 0 10*3/uL (ref 0.0–0.1)
Basophils Relative: 1 %
Eosinophils Absolute: 0.1 10*3/uL (ref 0.0–0.5)
Eosinophils Relative: 2 %
HCT: 42 % (ref 36.0–46.0)
Hemoglobin: 13.5 g/dL (ref 12.0–15.0)
Immature Granulocytes: 0 %
Lymphocytes Relative: 39 %
Lymphs Abs: 1.7 10*3/uL (ref 0.7–4.0)
MCH: 26 pg (ref 26.0–34.0)
MCHC: 32.1 g/dL (ref 30.0–36.0)
MCV: 80.8 fL (ref 80.0–100.0)
Monocytes Absolute: 0.4 10*3/uL (ref 0.1–1.0)
Monocytes Relative: 9 %
Neutro Abs: 2.2 10*3/uL (ref 1.7–7.7)
Neutrophils Relative %: 49 %
Platelets: 133 10*3/uL — ABNORMAL LOW (ref 150–400)
RBC: 5.2 MIL/uL — ABNORMAL HIGH (ref 3.87–5.11)
RDW: 14.5 % (ref 11.5–15.5)
WBC: 4.4 10*3/uL (ref 4.0–10.5)
nRBC: 0 % (ref 0.0–0.2)

## 2021-12-15 LAB — COMPREHENSIVE METABOLIC PANEL
ALT: 26 U/L (ref 0–44)
AST: 23 U/L (ref 15–41)
Albumin: 4.1 g/dL (ref 3.5–5.0)
Alkaline Phosphatase: 75 U/L (ref 38–126)
Anion gap: 7 (ref 5–15)
BUN: 26 mg/dL — ABNORMAL HIGH (ref 8–23)
CO2: 29 mmol/L (ref 22–32)
Calcium: 9.7 mg/dL (ref 8.9–10.3)
Chloride: 106 mmol/L (ref 98–111)
Creatinine, Ser: 0.59 mg/dL (ref 0.44–1.00)
GFR, Estimated: >60 mL/min (ref >60)
Glucose, Bld: 112 mg/dL — ABNORMAL HIGH (ref 70–99)
Potassium: 3.7 mmol/L (ref 3.5–5.1)
Sodium: 142 mmol/L (ref 135–145)
Total Bilirubin: 0.3 mg/dL (ref 0.3–1.2)
Total Protein: 8.1 g/dL (ref 6.5–8.1)

## 2021-12-15 LAB — VITAMIN D 25 HYDROXY (VIT D DEFICIENCY, FRACTURES): Vit D, 25-Hydroxy: 40.75 ng/mL (ref 30–100)

## 2021-12-17 LAB — CANCER ANTIGEN 15-3: CA 15-3: 18.4 U/mL (ref 0.0–25.0)

## 2021-12-21 ENCOUNTER — Inpatient Hospital Stay (HOSPITAL_BASED_OUTPATIENT_CLINIC_OR_DEPARTMENT_OTHER): Payer: Medicaid Other | Admitting: Hematology

## 2021-12-21 VITALS — BP 145/81 | HR 88 | Temp 97.9°F | Resp 18 | Ht 66.0 in | Wt 149.6 lb

## 2021-12-21 DIAGNOSIS — C50912 Malignant neoplasm of unspecified site of left female breast: Secondary | ICD-10-CM | POA: Diagnosis not present

## 2021-12-21 DIAGNOSIS — N6321 Unspecified lump in the left breast, upper outer quadrant: Secondary | ICD-10-CM

## 2021-12-21 DIAGNOSIS — C50412 Malignant neoplasm of upper-outer quadrant of left female breast: Secondary | ICD-10-CM | POA: Diagnosis not present

## 2021-12-21 NOTE — Patient Instructions (Signed)
[facility]  Discharge Instructions  You were seen and examined today by Dr. Delton Coombes.  Dr. Delton Coombes discussed your most breast ultrasound. The mass has decreased in size with Anastrozole. Continue taking Anastrozole as prescribed.  You should begin taking Calcium and Vitamin D twice daily.  Follow-up as scheduled.  Thank you for choosing [facility] to provide your oncology and hematology care.   To afford each patient quality time with our provider, please arrive at least 15 minutes before your scheduled appointment time. You may need to reschedule your appointment if you arrive late (10 or more minutes). Arriving late affects you and other patients whose appointments are after yours.  Also, if you miss three or more appointments without notifying the office, you may be dismissed from the clinic at the provider's discretion.    Again, thank you for choosing [facility].  Our hope is that these requests will decrease the amount of time that you wait before being seen by our physicians.   If you have a lab appointment with the [facility] please come in thru the Main Entrance and check in at the main information desk.           _____________________________________________________________  Should you have questions after your visit to [facility], please contact our office at [phone] and follow the prompts.  Our office hours are 8:00 a.m. to 4:30 p.m. Monday - Thursday and 8:00 a.m. to 2:30 p.m. Friday.  Please note that voicemails left after 4:00 p.m. may not be returned until the following business day.  We are closed weekends and all major holidays.  You do have access to a nurse 24-7, just call the main number to the clinic [phone] and do not press any options, hold on the line and a nurse will answer the phone.    For prescription refill requests, have your pharmacy contact our office and  allow 72 hours.    Masks are optional in the cancer centers. If you would like for your care team to wear a mask while they are taking care of you, please let them know. You may have one support person who is at least 64 years old accompany you for your appointments.

## 2021-12-21 NOTE — Progress Notes (Signed)
[facility] [address], [place] [address]   Patient Care Team: Everlena Cooper, NP (Inactive) as PCP - General (Family Medicine)  SUMMARY OF ONCOLOGIC HISTORY: Oncology History   No history exists.    CHIEF COMPLIANT: Follow-up of left breast IDC, ER positive.   INTERVAL HISTORY: [name] is a 66 y.o. female seen for follow-up of left breast cancer.  She is tolerating anastrozole very well.  Her only complaint is constipation.  She is taking stool softener every other day.  If she takes daily, she will have diarrhea.  REVIEW OF SYSTEMS:   Review of Systems  Constitutional:  Negative for appetite change and fatigue.  Respiratory:  Positive for shortness of breath.   Gastrointestinal:  Positive for constipation.  Endocrine: Negative for hot flashes.  Musculoskeletal:  Negative for arthralgias.  All other systems reviewed and are negative.   I have reviewed the past medical history, past surgical history, social history and family history with the patient and they are unchanged from previous note.   ALLERGIES:   has No Known Allergies.   MEDICATIONS:  Current Outpatient Medications  Medication Sig Dispense Refill   acetaminophen (TYLENOL) 325 MG tablet Place 2 tablets (650 mg total) into feeding tube every 6 (six) hours as needed for mild pain.     ALPRAZolam (XANAX) 0.5 MG tablet Take 0.5 mg by mouth 3 (three) times daily as needed.     amLODipine (NORVASC) 5 MG tablet Place 1 tablet (5 mg total) into feeding tube daily.     anastrozole (ARIMIDEX) 1 MG tablet Take 1 tablet (1 mg total) by mouth daily. 30 tablet 6   chlorhexidine (PERIDEX) 0.12 % solution 15 mLs by Mouth Rinse route 2 (two) times daily. 120 mL 0   DULoxetine (CYMBALTA) 20 MG capsule Take 20 mg by mouth daily.     fluticasone (FLONASE) 50 MCG/ACT nasal spray Place 1 spray into both nostrils 2 (two) times daily.  2   glycopyrrolate (ROBINUL) 1 MG tablet Take 1 tablet (1 mg  total) by mouth 2 (two) times daily. (Patient taking differently: Place 1 mg into feeding tube 2 (two) times daily.)     guaiFENesin (ROBITUSSIN) 100 MG/5ML liquid Place 10 mLs into feeding tube every 8 (eight) hours. 120 mL 0   ipratropium-albuterol (DUONEB) 0.5-2.5 (3) MG/3ML SOLN Take 3 mLs by nebulization every 4 (four) hours as needed. 360 mL    levETIRAcetam (KEPPRA) 100 MG/ML solution Place 7.5 mLs (750 mg total) into feeding tube 2 (two) times daily. 473 mL 12   levothyroxine (SYNTHROID) 100 MCG tablet Place 1 tablet (100 mcg total) into feeding tube daily at 6 (six) AM. 30 tablet 3   Nutritional Supplements (FEEDING SUPPLEMENT, OSMOLITE 1.2 CAL,) LIQD Place 355 mLs into feeding tube 5 (five) times daily.  0   polyethylene glycol (MIRALAX / GLYCOLAX) 17 g packet Place 17 g into feeding tube 2 (two) times daily. 14 each 0   QUEtiapine (SEROQUEL) 50 MG tablet Place 1 tablet (50 mg total) into feeding tube 2 (two) times daily. 60 tablet 0   senna (SENOKOT) 8.6 MG TABS tablet Place 1 tablet (8.6 mg total) into feeding tube daily. 120 tablet 0   Water For Irrigation, Sterile (FREE WATER) SOLN Place 150 mLs into feeding tube every 6 (six) hours.     No current facility-administered medications for this visit.     PHYSICAL EXAMINATION: Performance status (ECOG): 3 - Symptomatic, >50% confined to  bed  There were no vitals filed for this visit.  Wt Readings from Last 3 Encounters:  12/05/21 141 lb 15.6 oz (64.4 kg)  08/19/21 141 lb 14.4 oz (64.4 kg)  07/14/21 144 lb 6.4 oz (65.5 kg)   Physical Exam Vitals reviewed.  Constitutional:      Appearance: Normal appearance.  Cardiovascular:     Rate and Rhythm: Normal rate and regular rhythm.     Pulses: Normal pulses.     Heart sounds: Normal heart sounds.  Pulmonary:     Effort: Pulmonary effort is normal.     Breath sounds: Normal breath sounds.  Neurological:     General: No focal deficit present.     Mental Status: She is alert and  oriented to person, place, and time.  Psychiatric:        Mood and Affect: Mood normal.        Behavior: Behavior normal.       LABORATORY DATA:  I have reviewed the data as listed    Latest Ref Rng & Units 12/15/2021    8:27 AM 06/02/2021    4:33 AM 06/01/2021   10:17 AM  CMP  Glucose 70 - 99 mg/dL 112  84  223   BUN 8 - 23 mg/dL _0 Creatinine 0.44 - 1.00 mg/dL 0.59  0.55  0.76   Sodium 135 - 145 mmol/L 142  143  138   Potassium 3.5 - 5.1 mmol/L 3.7  4.0  3.4   Chloride 98 - 111 mmol/L 106  106  100   CO2 22 - 32 mmol/L _1 Calcium 8.9 - 10.3 mg/dL 9.7  9.7  9.4   Total Protein 6.5 - 8.1 g/dL 8.1   7.9   Total Bilirubin 0.3 - 1.2 mg/dL 0.3   0.2   Alkaline Phos 38 - 126 U/L 75   71   AST 15 - 41 U/L 23   58   ALT 0 - 44 U/L 26   54    Lab Results  Component Value Date   CAN153 18.4 12/15/2021   Lab Results  Component Value Date   WBC 4.4 12/15/2021   HGB 13.5 12/15/2021   HCT 42.0 12/15/2021   MCV 80.8 12/15/2021   PLT 133 (L) 12/15/2021   NEUTROABS 2.2 12/15/2021    ASSESSMENT:  1.  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 - Left breast biopsy on 06/16/2021 - Pathology: Invasive ductal carcinoma, grade 2/3, ER 80% positive, weak to moderate staining, PR 30% positive, moderate staining, Ki-67-1%.  HER2 was 2+ by IHC and negative by FISH. - PET scan on 0/27/2536: Hypermetabolic left breast mass and enlarged hypermetabolic left axillary lymph nodes extending to the subpectoral nodal station.  Skin thickening in the left axilla.  No distant metastatic disease. - Anastrozole started on 06/29/2021. - She was evaluated by Dr. Arnoldo Morale and immediate surgery was not recommended.  2.  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  3.  Social/family history: - She used to live with her  daughter prior to recent placement at [facility] since June/July of 2022.  She used to work at [employer].  Quit smoking 2 years ago.  Smoked half pack  per day for 45 years. - Sister had breast cancer.  Daughter had lung cancer.  Maternal grandmother had lung cancer.  Another sister had ovarian cancer.   PLAN:  1.  Stage IIIb (T2 N3 M0) left breast IDC, ER/PR positive and HER2 negative: - She is tolerating anastrozole reasonably well via PEG tube. - No major arthralgias or hot flashes. - Reviewed labs from 12/15/2021 which showed normal LFTs.  CBC was grossly normal.  CA 15-3 was 18.4. - Reviewed left breast ultrasound from 12/15/2021.  Left breast mass at 2 o'clock position has measured 2.4 x 1.5 x 2.2 cm, previously 4.4 x 2.3 x 3.3 cm.  Abnormal lymph nodes again noted in the left axilla. - She had overall decrease in size of the left breast mass consistent with response. - Recommend continuing anastrozole at this time as she is not a candidate for surgery. - RTC 4 months for follow-up with repeat left breast ultrasound.  We will also check tumor markers.  2.  Bone health: - DEXA scan on 07/06/2021 with T score -0.1. - Vitamin D is 40.7.  Recommend calcium supplements.  Breast Cancer therapy associated bone loss: I have recommended calcium, Vitamin D and weight bearing exercises.  Orders placed this encounter:  No orders of the defined types were placed in this encounter.   The patient has a good understanding of the overall plan. She agrees with it. She will call with any problems that may develop before the next visit here.  Derek Jack, MD [place] [phone]

## 2021-12-25 ENCOUNTER — Ambulatory Visit
Admission: RE | Admit: 2021-12-25 | Discharge: 2021-12-25 | Disposition: A | Discharge status: Home / Self Care | Payer: Medicaid Other | Source: Ambulatory Visit | Attending: Neurosurgery | Admitting: Neurosurgery

## 2021-12-25 DIAGNOSIS — I609 Nontraumatic subarachnoid hemorrhage, unspecified: Secondary | ICD-10-CM

## 2021-12-25 MED ORDER — IOPAMIDOL (ISOVUE-370) INJECTION 76%
75.0000 mL | Freq: Once | INTRAVENOUS | Status: AC | PRN
  Administered 2021-12-25: 75 mL via INTRAVENOUS

## 2022-02-05 ENCOUNTER — Other Ambulatory Visit (HOSPITAL_COMMUNITY): Payer: Self-pay | Admitting: Nurse Practitioner

## 2022-02-05 DIAGNOSIS — R633 Feeding difficulties, unspecified: Secondary | ICD-10-CM

## 2022-02-11 ENCOUNTER — Other Ambulatory Visit (HOSPITAL_COMMUNITY): Payer: Self-pay | Admitting: Nurse Practitioner

## 2022-02-11 ENCOUNTER — Ambulatory Visit (HOSPITAL_COMMUNITY)
Admission: RE | Admit: 2022-02-11 | Discharge: 2022-02-11 | Disposition: A | Discharge status: Home / Self Care | Payer: Medicaid Other | Source: Ambulatory Visit | Attending: Nurse Practitioner | Admitting: Nurse Practitioner

## 2022-02-11 ENCOUNTER — Encounter (HOSPITAL_COMMUNITY): Payer: Self-pay

## 2022-02-11 DIAGNOSIS — Z431 Encounter for attention to gastrostomy: Secondary | ICD-10-CM | POA: Insufficient documentation

## 2022-02-11 DIAGNOSIS — R633 Feeding difficulties, unspecified: Secondary | ICD-10-CM | POA: Insufficient documentation

## 2022-02-11 HISTORY — PX: IR PATIENT EVAL TECH 0-60 MINS: IMG5564

## 2022-02-11 NOTE — Procedures (Signed)
Patient was seen in [facility] today for gastrostomy tube issues.  The patient stated her tube was leaking some at times, and that the previous feeding tip that was on her tube had fell off, and the new one, that was placed on at her living facility, wasn't working well.  We were able to replace the clamp around the tube to help prevent leaking and we also placed a new gastrostomy tube tip on her tube as well.  The tube flushed well.  The patient was given an extra tip and instructed, if the current one was to fall off again, to just replace it with the new one and to call us with any other issues and questions.

## 2022-03-03 ENCOUNTER — Ambulatory Visit (HOSPITAL_COMMUNITY)
Admission: RE | Admit: 2022-03-03 | Discharge: 2022-03-03 | Disposition: A | Discharge status: Home / Self Care | Payer: Medicaid Other | Source: Ambulatory Visit | Attending: Acute Care | Admitting: Acute Care

## 2022-03-03 DIAGNOSIS — E890 Postprocedural hypothyroidism: Secondary | ICD-10-CM | POA: Diagnosis present

## 2022-03-03 DIAGNOSIS — Z930 Tracheostomy status: Secondary | ICD-10-CM

## 2022-03-03 DIAGNOSIS — I69891 Dysphagia following other cerebrovascular disease: Secondary | ICD-10-CM

## 2022-03-03 DIAGNOSIS — I609 Nontraumatic subarachnoid hemorrhage, unspecified: Secondary | ICD-10-CM

## 2022-03-03 DIAGNOSIS — J3802 Paralysis of vocal cords and larynx, bilateral: Secondary | ICD-10-CM | POA: Diagnosis present

## 2022-03-03 DIAGNOSIS — Z9889 Other specified postprocedural states: Secondary | ICD-10-CM | POA: Diagnosis present

## 2022-03-03 NOTE — Progress Notes (Signed)
Reason for visit  Planned trach change  HPI 66 yof well known to me. 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 She underwent thyroidectomy and trach by ENT. Last seen by ENT about 1 yr ago and noted to have paresis/paralysis of vocal cords w/ poor sensation of the hypopharynx I saw her last in July 2023. At that time I felt she was not a candidate for decannulation d/t 1) trach secretions and 2) possible VC paralysis. She has a pending appointment w/ ENT tomorrow for f/u regarding this.   Review of Systems  Constitutional: Negative.   HENT:         Reports trach site a little tender. Still has copious oral secretions   Eyes: Negative.   Respiratory: Negative.    Cardiovascular: Negative.   Gastrointestinal: Negative.   Genitourinary: Negative.   Skin: Negative.    Exam  66 year old female sitting up in chair. She is in no distress HENT NCAT no JVD # 4 trach is midline. Stoma unremarkable, has copious oral secretions Pulm scattered rhonchi. No accessory use Card rrr Abd soft  Ext warm and dry  Neuro awake. Moved all ext. Oriented  Procedure Trach change The #4 trach was removed. The site was evaluated and found to be unremarkable w/ the exception of copious oral secretions. The new 4 shiley was placed over obturator w/out difficulty. 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 Has  appointment Dec 2023    My time 32 min  Erick Colace ACNP-BC [place] Pager # [phone] OR # [phone] if no answer

## 2022-03-03 NOTE — Progress Notes (Signed)
.  Tracheostomy Procedure Note  Stacy Moran [id number] 12-19-1955  Pre Procedure Tracheostomy Information  Trach Brand: Shiley Size:  4.0  [id number] Style: Uncuffed Secured by: Velcro   Procedure: Trach Cleaning and Trach Change    Post Procedure Tracheostomy Information  Trach Brand: Shiley Size:  4.0 [id number] Style: Uncuffed Secured by: Velcro   Post Procedure Evaluation:  ETCO2 positive color change from yellow to purple : Yes.   Vital signs:VSS Patients current condition: unstable Complications: No apparent complications Trach site exam: clean, dry Wound care done: 4 x 4 gauze drain gauze Patient did tolerate procedure well.   Education: none  Prescription needs: none    Additional needs: New PMV placed at this visit

## 2022-03-18 ENCOUNTER — Emergency Department (HOSPITAL_COMMUNITY): Payer: Medicaid Other

## 2022-03-18 ENCOUNTER — Other Ambulatory Visit: Payer: Self-pay

## 2022-03-18 ENCOUNTER — Emergency Department (HOSPITAL_COMMUNITY)
Admission: EM | Admit: 2022-03-18 | Discharge: 2022-03-18 | Disposition: A | Discharge status: Skilled Nursing Facility | Payer: Medicaid Other | Attending: Emergency Medicine | Admitting: Emergency Medicine

## 2022-03-18 ENCOUNTER — Encounter (HOSPITAL_COMMUNITY): Payer: Self-pay | Admitting: *Deleted

## 2022-03-18 DIAGNOSIS — J9500 Unspecified tracheostomy complication: Secondary | ICD-10-CM | POA: Insufficient documentation

## 2022-03-18 NOTE — ED Triage Notes (Signed)
Pt brought in by [facility] for c/o bleeding in trach tube; the facility changed out the trach but the facility still has blood  Pt is 100% on 8L  Pt has no complaints

## 2022-03-18 NOTE — ED Notes (Signed)
[facility] called to transport patient. Paramedic and charge nurse informed.

## 2022-03-18 NOTE — Progress Notes (Signed)
RT responded to bedside for a blood in the trach of this patient. Upon arrival, patient was on 4L/31% venti setup for travel. O2 saturation was 96% on arrival as well.  After assessment, suctioning and trach cleaning, the patient is within normal range of all vitals..  The patient was able to explain previous suctioning procedures and how suctioning had been done a little too hard before. After suctioning here, there was a touch of blood on the end of the catheter but no frank blood in the trach or the inner cannula.  New drain sponge was applied, and trach is clean at this time.  RT will continue to monitor as needed.

## 2022-03-18 NOTE — ED Provider Notes (Signed)
[facility] EMERGENCY DEPARTMENT Provider Note   CSN: [id number] Arrival date & time: 03/18/22  1159     History Chief Complaint  Patient presents with   Coagulation Disorder    HPI Stacy Moran is a 66 y.o. female presenting for bleeding out of her tracheostomy tube today.  She was scheduled for tracheostomy tube replacement today and underwent it.  Following the procedure she had some bleeding coming out of her tracheostomy tube.  They suction the tube with more dark blood.  They sent her in for further care and management.  In transfer all bleeding is stopped.  Patient denies fevers or chills, nausea vomiting, syncope shortness of breath.  She states that she does not feel anything inside of her mouth, throat or chest.  She states she would like to be discharged.  She denies fevers or chills, nausea vomiting.   Patient's recorded medical, surgical, social, medication list and allergies were reviewed in the Snapshot window as part of the initial history.   Review of Systems   Review of Systems  Constitutional:  Negative for chills and fever.  HENT:  Negative for ear pain and sore throat.   Eyes:  Negative for pain and visual disturbance.  Respiratory:  Negative for cough and shortness of breath.   Cardiovascular:  Negative for chest pain and palpitations.  Gastrointestinal:  Negative for abdominal pain and vomiting.  Genitourinary:  Negative for dysuria and hematuria.  Musculoskeletal:  Negative for arthralgias and back pain.  Skin:  Negative for color change and rash.  Neurological:  Negative for seizures and syncope.  All other systems reviewed and are negative.   Physical Exam Updated Vital Signs BP 121/69   Pulse 73   Resp 19   Ht '5\' 6"'$  (1.676 m)   Wt 65.8 kg   SpO2 100%   BMI 23.40 kg/m  Physical Exam Vitals and nursing note reviewed.  Constitutional:      General: She is not in acute distress.    Appearance: She is well-developed.  HENT:     Head:  Normocephalic and atraumatic.  Eyes:     Conjunctiva/sclera: Conjunctivae normal.  Cardiovascular:     Rate and Rhythm: Normal rate and regular rhythm.     Heart sounds: No murmur heard. Pulmonary:     Effort: Pulmonary effort is normal. No respiratory distress.     Breath sounds: Normal breath sounds.  Abdominal:     General: There is no distension.     Palpations: Abdomen is soft.     Tenderness: There is no abdominal tenderness. There is no right CVA tenderness or left CVA tenderness.  Musculoskeletal:        General: No swelling or tenderness. Normal range of motion.     Cervical back: Neck supple.  Skin:    General: Skin is warm and dry.  Neurological:     General: No focal deficit present.     Mental Status: She is alert and oriented to person, place, and time. Mental status is at baseline.     Cranial Nerves: No cranial nerve deficit.      ED Course/ Medical Decision Making/ A&P    Procedures Procedures   Medications Ordered in ED Medications - No data to display  Medical Decision Making:    Stacy Moran is a 66 y.o. female who presented to the ED today with tracheostomy problem detailed above.     Patient's presentation is complicated by their history of multiple comorbid medical problems.  Patient placed on continuous vitals and telemetry monitoring while in ED which was reviewed periodically.   Complete initial physical exam performed, notably the patient  was hemodynamically stable in no acute distress.  RT is at bedside.  She performs respiratory care at this time and there is no evidence of acute bleeding..      Reviewed and confirmed nursing documentation for past medical history, family history, social history.    Initial Assessment:   Patient history present on is a physical dependence and was consistent with tracheostomy complication.  Acute considered tracheoinnominate artery bleeding, however bleeding has stopped spontaneously making this less  likely.  Given well appearance, I do not believe there is any benefit for further aggressive intervention at this time.  X-ray demonstrates appropriate positioning of tracheostomy and patient would like to be discharged given her asymptomatic nature.  Overall I believe this is reasonable and patient is discharged with no further acute events.  Disposition:  I have considered need for hospitalization, however, considering all of the above, I believe this patient is stable for discharge at this time.  Patient/family educated about specific return precautions for given chief complaint and symptoms.  Patient/family educated about follow-up with PCP.     Patient/family expressed understanding of return precautions and need for follow-up. Patient spoken to regarding all imaging and laboratory results and appropriate follow up for these results. All education provided in verbal form with additional information in written form. Time was allowed for answering of patient questions. Patient discharged.    Emergency Department Medication Summary:   Medications - No data to display       Clinical Impression:  1. Tracheostomy complication, unspecified complication type [facility])      Discharge   Final Clinical Impression(s) / ED Diagnoses Final diagnoses:  Tracheostomy complication, unspecified complication type [facility])    Rx / DC Orders ED Discharge Orders     None         Tretha Sciara, MD 03/18/22 1322

## 2022-04-20 ENCOUNTER — Encounter (HOSPITAL_COMMUNITY): Payer: Self-pay

## 2022-04-20 ENCOUNTER — Inpatient Hospital Stay: Payer: Medicaid Other | Attending: Hematology

## 2022-04-20 ENCOUNTER — Ambulatory Visit (HOSPITAL_COMMUNITY)
Admission: RE | Admit: 2022-04-20 | Discharge: 2022-04-20 | Disposition: A | Discharge status: Home / Self Care | Payer: Medicaid Other | Source: Ambulatory Visit | Attending: Hematology | Admitting: Hematology

## 2022-04-20 DIAGNOSIS — C50412 Malignant neoplasm of upper-outer quadrant of left female breast: Secondary | ICD-10-CM | POA: Insufficient documentation

## 2022-04-20 DIAGNOSIS — C50912 Malignant neoplasm of unspecified site of left female breast: Secondary | ICD-10-CM

## 2022-04-20 DIAGNOSIS — Z170 Estrogen receptor positive status [ER+]: Secondary | ICD-10-CM | POA: Diagnosis not present

## 2022-04-20 DIAGNOSIS — Z79811 Long term (current) use of aromatase inhibitors: Secondary | ICD-10-CM | POA: Insufficient documentation

## 2022-04-20 DIAGNOSIS — N6321 Unspecified lump in the left breast, upper outer quadrant: Secondary | ICD-10-CM

## 2022-04-20 LAB — CBC WITH DIFFERENTIAL/PLATELET
Abs Immature Granulocytes: 0.01 10*3/uL (ref 0.00–0.07)
Basophils Absolute: 0 10*3/uL (ref 0.0–0.1)
Basophils Relative: 0 %
Eosinophils Absolute: 0.1 10*3/uL (ref 0.0–0.5)
Eosinophils Relative: 2 %
HCT: 40.4 % (ref 36.0–46.0)
Hemoglobin: 12.6 g/dL (ref 12.0–15.0)
Immature Granulocytes: 0 %
Lymphocytes Relative: 26 %
Lymphs Abs: 1.3 10*3/uL (ref 0.7–4.0)
MCH: 26 pg (ref 26.0–34.0)
MCHC: 31.2 g/dL (ref 30.0–36.0)
MCV: 83.5 fL (ref 80.0–100.0)
Monocytes Absolute: 0.4 10*3/uL (ref 0.1–1.0)
Monocytes Relative: 7 %
Neutro Abs: 3.3 10*3/uL (ref 1.7–7.7)
Neutrophils Relative %: 65 %
Platelets: 143 10*3/uL — ABNORMAL LOW (ref 150–400)
RBC: 4.84 MIL/uL (ref 3.87–5.11)
RDW: 14.1 % (ref 11.5–15.5)
WBC: 5 10*3/uL (ref 4.0–10.5)
nRBC: 0 % (ref 0.0–0.2)

## 2022-04-20 LAB — COMPREHENSIVE METABOLIC PANEL
ALT: 22 U/L (ref 0–44)
AST: 22 U/L (ref 15–41)
Albumin: 3.8 g/dL (ref 3.5–5.0)
Alkaline Phosphatase: 72 U/L (ref 38–126)
Anion gap: 9 (ref 5–15)
BUN: 25 mg/dL — ABNORMAL HIGH (ref 8–23)
CO2: 28 mmol/L (ref 22–32)
Calcium: 9.4 mg/dL (ref 8.9–10.3)
Chloride: 103 mmol/L (ref 98–111)
Creatinine, Ser: 0.68 mg/dL (ref 0.44–1.00)
GFR, Estimated: >60 mL/min (ref >60)
Glucose, Bld: 159 mg/dL — ABNORMAL HIGH (ref 70–99)
Potassium: 4 mmol/L (ref 3.5–5.1)
Sodium: 140 mmol/L (ref 135–145)
Total Bilirubin: 0.6 mg/dL (ref 0.3–1.2)
Total Protein: 7.6 g/dL (ref 6.5–8.1)

## 2022-04-21 LAB — MISC LABCORP TEST (SEND OUT): Labcorp test code: 81950

## 2022-04-22 LAB — CANCER ANTIGEN 15-3: CA 15-3: 18.4 U/mL (ref 0.0–25.0)

## 2022-04-26 ENCOUNTER — Other Ambulatory Visit: Payer: Self-pay | Admitting: *Deleted

## 2022-04-26 DIAGNOSIS — C50912 Malignant neoplasm of unspecified site of left female breast: Secondary | ICD-10-CM

## 2022-04-26 DIAGNOSIS — N6321 Unspecified lump in the left breast, upper outer quadrant: Secondary | ICD-10-CM

## 2022-04-27 ENCOUNTER — Ambulatory Visit: Payer: Medicaid Other | Admitting: Hematology

## 2022-05-11 ENCOUNTER — Ambulatory Visit: Payer: Medicaid Other | Admitting: Hematology

## 2022-05-18 ENCOUNTER — Ambulatory Visit (HOSPITAL_COMMUNITY)
Admission: RE | Admit: 2022-05-18 | Discharge: 2022-05-18 | Disposition: A | Discharge status: Home / Self Care | Payer: Medicaid Other | Source: Ambulatory Visit | Attending: Hematology | Admitting: Hematology

## 2022-05-18 DIAGNOSIS — N6321 Unspecified lump in the left breast, upper outer quadrant: Secondary | ICD-10-CM | POA: Diagnosis present

## 2022-05-18 DIAGNOSIS — C50912 Malignant neoplasm of unspecified site of left female breast: Secondary | ICD-10-CM | POA: Diagnosis present

## 2022-05-20 ENCOUNTER — Ambulatory Visit (HOSPITAL_COMMUNITY)
Admission: RE | Admit: 2022-05-20 | Discharge: 2022-05-20 | Disposition: A | Discharge status: Home / Self Care | Payer: Medicaid Other | Source: Ambulatory Visit | Attending: Acute Care | Admitting: Acute Care

## 2022-05-20 DIAGNOSIS — Z930 Tracheostomy status: Secondary | ICD-10-CM | POA: Diagnosis present

## 2022-05-20 DIAGNOSIS — E890 Postprocedural hypothyroidism: Secondary | ICD-10-CM | POA: Diagnosis present

## 2022-05-20 DIAGNOSIS — I69891 Dysphagia following other cerebrovascular disease: Secondary | ICD-10-CM | POA: Insufficient documentation

## 2022-05-20 DIAGNOSIS — J3802 Paralysis of vocal cords and larynx, bilateral: Secondary | ICD-10-CM | POA: Diagnosis present

## 2022-05-20 DIAGNOSIS — R0689 Other abnormalities of breathing: Secondary | ICD-10-CM | POA: Diagnosis present

## 2022-05-20 DIAGNOSIS — I609 Nontraumatic subarachnoid hemorrhage, unspecified: Secondary | ICD-10-CM

## 2022-05-20 NOTE — Progress Notes (Signed)
Reason for visit  Planned trach change  HPI 67 yof well known to me. 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 She underwent thyroidectomy and trach by ENT. Last seen by ENT about 1 yr ago and noted to have paresis/paralysis of vocal cords w/ poor sensation of the hypopharynx I saw her last in July 2023. At that time I felt she was not a candidate for decannulation d/t 1) trach secretions and 2) possible VC paralysis. I saw her last 12/7 for planned change. At my last visit I felt she was still not a candidate for decannulation due to secretions and poor airway clearance but she was also to see ENT shortly after.  Review of records:  ENT 12/7 trach stable w/ thick secretions. No recs to move forward with eval for decannulation (agree).  12/21 ER visit Comes in today for planned change   Review of Systems  Constitutional:  Negative for chills, fever and malaise/fatigue.  HENT:  Positive for congestion.        Still has copious oral secretions   Eyes: Negative.   Respiratory: Negative.    Cardiovascular: Negative.   Gastrointestinal: Negative.   Genitourinary: Negative.   Skin: Negative.    Exam  General Pleasant 67 year old female. Continues to improve in function and endurance on subsequent visits. She ambulates w/ walker HENT #4 trach is midline. Phonation audible and strength has improved. Still "gurgly" w/ copious oral secretions. Cough actually fairly strong Pulm dec bases no accessory use Card rrr Abd soft Ext warm and dry    Procedure Trach change The #4 trach was removed. The site was evaluated and found to be unremarkable w/ the exception of copious oral secretions. The new 4 shiley was placed over obturator w/out difficulty. 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 # 4 shiley  Impression Trach dependent due to ineffective airway clearance, copious oral secretions and vocal cord injury. 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 Although I think her risk of aspiration is pretty high she has improved significantly and it would be reasonable to consider SLP evaluation to re-evaluate swallow.. I encouraged her to speak to her care team about this.     My time 48 min  Erick Colace ACNP-BC [place] Pager # [phone] OR # [phone] if no answer

## 2022-05-20 NOTE — Progress Notes (Signed)
2/22- Respiratory care note- trach change complete. Pt continuing with a #4 cuffless shiley. Pt tolerated trach change well with good color change in end-tidal. Pt tolerated procedure well. Spare inner cannula and passy-muir valve sent with patient.

## 2022-05-27 ENCOUNTER — Inpatient Hospital Stay: Payer: Medicaid Other | Attending: Hematology | Admitting: Hematology

## 2022-05-27 VITALS — BP 129/94 | HR 88 | Temp 99.1°F | Resp 16 | Wt 162.0 lb

## 2022-05-27 DIAGNOSIS — C50412 Malignant neoplasm of upper-outer quadrant of left female breast: Secondary | ICD-10-CM | POA: Insufficient documentation

## 2022-05-27 DIAGNOSIS — I10 Essential (primary) hypertension: Secondary | ICD-10-CM | POA: Diagnosis not present

## 2022-05-27 DIAGNOSIS — C50912 Malignant neoplasm of unspecified site of left female breast: Secondary | ICD-10-CM | POA: Diagnosis not present

## 2022-05-27 DIAGNOSIS — Z170 Estrogen receptor positive status [ER+]: Secondary | ICD-10-CM | POA: Insufficient documentation

## 2022-05-27 DIAGNOSIS — Z79811 Long term (current) use of aromatase inhibitors: Secondary | ICD-10-CM | POA: Diagnosis not present

## 2022-05-27 DIAGNOSIS — Z8673 Personal history of transient ischemic attack (TIA), and cerebral infarction without residual deficits: Secondary | ICD-10-CM | POA: Diagnosis not present

## 2022-05-27 DIAGNOSIS — Z8041 Family history of malignant neoplasm of ovary: Secondary | ICD-10-CM | POA: Insufficient documentation

## 2022-05-27 DIAGNOSIS — Z803 Family history of malignant neoplasm of breast: Secondary | ICD-10-CM | POA: Insufficient documentation

## 2022-05-27 DIAGNOSIS — Z801 Family history of malignant neoplasm of trachea, bronchus and lung: Secondary | ICD-10-CM | POA: Insufficient documentation

## 2022-05-27 DIAGNOSIS — Z87891 Personal history of nicotine dependence: Secondary | ICD-10-CM | POA: Insufficient documentation

## 2022-05-27 DIAGNOSIS — C773 Secondary and unspecified malignant neoplasm of axilla and upper limb lymph nodes: Secondary | ICD-10-CM | POA: Diagnosis not present

## 2022-05-27 DIAGNOSIS — Z79899 Other long term (current) drug therapy: Secondary | ICD-10-CM | POA: Diagnosis not present

## 2022-05-27 NOTE — Progress Notes (Signed)
[facility] [address] [place] [address]    Clinic Day:  05/27/2022  Referring physician: Hal Morales, *  Patient Care Team: Hal Morales, DO as PCP - General (Family Medicine) Derek Jack, MD as Medical Oncologist (Medical Oncology)   ASSESSMENT & PLAN:   Assessment: 1.  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 - Left breast biopsy on 06/16/2021 - Pathology: Invasive ductal carcinoma, grade 2/3, ER 80% positive, weak to moderate staining, PR 30% positive, moderate staining, Ki-67-1%.  HER2 was 2+ by IHC and negative by FISH. - PET scan on [id number]: Hypermetabolic left breast mass and enlarged hypermetabolic left axillary lymph nodes extending to the subpectoral nodal station.  Skin thickening in the left axilla.  No distant metastatic disease. - Anastrozole started on 06/29/2021. - She was evaluated by Dr. Arnoldo Morale and immediate surgery was not recommended.  2.  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  3.  Social/family history: - She used to live with her daughter prior to recent placement at [facility] since June/July of 2022.  She used to work at [employer].  Quit smoking 2 years ago.  Smoked half pack per day for 45 years. - Sister had breast cancer.  Daughter had lung cancer.  Mother had lung cancer.  Another sister had ovarian cancer.    Plan: 1.  Stage IIIb (T2 N3 M0) left breast IDC, ER/PR positive and HER2 negative: - She is tolerating anastrozole very well via PEG tube. - She does not have any arthralgias or vasomotor symptoms. - Physical examination today shows left breast mass in the outer quadrant.  There is a left axillary adenopathy palpable. - Reviewed labs from 04/20/2022 which showed normal LFTs and CBC.  CA  15-3 is normal. - Reviewed ultrasound from 05/18/2022: Irregular hypoechoic mass at 2 o'clock position measures 3.2 x 1.7 x 2.7 cm, previously 2.4 x 1.5 x 1.2 cm from 12/15/2021.  No change in the abnormal left axillary lymph nodes. - Based on these findings, I have talked to her about surgical options.  Patient is doing very well and is gaining weight and feeling much stronger than at the time of her diagnosis. - She initially wanted lumpectomy but has agreed for mastectomy given the relative size of the tumor to the breast. - I have recommended PET scan to rule out metastatic disease. - I have also recommended genetics evaluation. - I will make referral to Dr. Arnoldo Morale for his opinion regarding surgery.   2.  Bone health: - DEXA scan on 07/06/2021 with T-score of -0.1. - Vitamin D today is 43.  Continue calcium and vitamin D supplements.  Orders Placed This Encounter  Procedures   NM PET Image Restag (PS) Skull Base To Thigh    Standing Status:   Future    Standing Expiration Date:   05/27/2023    Order Specific Question:   If indicated for the ordered procedure, I authorize the administration of a radiopharmaceutical per Radiology protocol    Answer:   Yes    Order Specific Question:   Preferred imaging location?    Answer:   [facility]    Order Specific Question:   Release to patient    Answer:   Immediate   Genetic Screening  Order    Standing Status:   Future    Standing Expiration Date:   05/27/2023      Kaleen Odea as a scribe for Derek Jack, MD.,have documented all relevant documentation on the behalf of Derek Jack, MD,as directed by  Derek Jack, MD while in the presence of Derek Jack, MD.   I, Derek Jack MD, have reviewed the above documentation for accuracy and completeness, and I agree with the above.   Derek Jack, MD   2/29/20247:05 PM  CHIEF COMPLAINT:   Diagnosis: left breast IDC, ER positive.    Cancer Staging  Breast cancer, left breast ([place]) Staging form: Breast, AJCC 8th Edition - Clinical stage from 06/28/2021: Stage IIIB (cT2, cN3, cM0, G2, ER+, PR+, HER2-) - Unsigned    Prior Therapy: None  Current Therapy: Anastrozole   HISTORY OF PRESENT ILLNESS:   Oncology History   No history exists.     INTERVAL HISTORY:   Jamaia is a 67 y.o. female presenting to clinic today for follow up of  left breast IDC, ER positive. She was last seen by me on 12/22/2022.  On 03/18/2022 she visited the ED for Tracheostomy complication, unspecified complication type ([place]).   Today, she states that she is doing well overall. Her appetite level is at 100%. Her energy level is at 80%. Her weight increased since her last visit. She denies missing doses of anastrozole.  She does not report any hot flashes or arthralgias from anastrozole.  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[facility])    Seizures ([place])    Status post insertion of percutaneous endoscopic gastrostomy (PEG) tube ([place])    Stroke ([place])    Subdural hematoma ([place])    Tracheostomy dependent ([place])    Tracheostomy present [facility])     Surgical History: Past Surgical History:  Procedure Laterality Date   BURR HOLE N/A 03/18/2020   Procedure: Haskell Flirt;  Surgeon: [name], MD;  Location: [place];  Service: Neurosurgery;  Laterality: N/A;   CRANIOTOMY Left 01/30/2020   Procedure: LEFT FAR LATERAL CRANIOTOMY FOR ANEURYSM CLIPPING;  Surgeon: [name], MD;  Location: [place];  Service: Neurosurgery;  Laterality: Left;   DIRECT LARYNGOSCOPY N/A 02/29/2020   Procedure: DIRECT LARYNGOSCOPY;  Surgeon: [name], MD;  Location: [place];  Service: ENT;  Laterality: N/A;   DIRECT LARYNGOSCOPY N/A 10/03/2020   Procedure: DIRECT LARYNGOSCOPY w/esophagascopy;  Surgeon: [name],  MD;  Location: [place];  Service: ENT;  Laterality: N/A;   INTUBATION-ENDOTRACHEAL WITH TRACHEOSTOMY STANDBY N/A 12/14/2020   Procedure: Warrick Parisian WITH TRACHEOSTOMY STANDBY;  Surgeon: Carloyn Manner, MD;  Location: [facility];  Service: ENT;  Laterality: N/A;   IR ANGIO INTRA EXTRACRAN SEL INTERNAL CAROTID BILAT MOD SED  01/30/2020   IR ANGIO VERTEBRAL SEL VERTEBRAL UNI L MOD SED  01/30/2020   IR CM INJ ANY COLONIC TUBE W/FLUORO  07/03/2020   IR GASTROSTOMY TUBE MOD SED  02/22/2020   IR PATIENT EVAL TECH 0-60 MINS  02/11/2022   IR [place] GASTRO/COLONIC TUBE PERCUT W/FLUORO  07/03/2020   LAPAROSCOPIC REVISION VENTRICULAR-PERITONEAL (V-P) SHUNT N/A 03/18/2020   Procedure: LAPAROSCOPIC REVISION VENTRICULAR-PERITONEAL (V-P) SHUNT;  Surgeon: [name], MD;  Location: [place];  Service: General;  Laterality: N/A;   RADIOLOGY WITH ANESTHESIA N/A 01/30/2020   Procedure: IR WITH  ANESTHESIA;  Surgeon: [name], MD;  Location: [place];  Service: Radiology;  Laterality: N/A;   THYROIDECTOMY N/A 02/08/2020   Procedure: THYROIDECTOMY;  Surgeon: [name], MD;  Location: [place];  Service: ENT;  Laterality: N/A;   TRACHEOSTOMY TUBE PLACEMENT N/A 02/08/2020   Procedure: TRACHEOSTOMY;  Surgeon: [name], MD;  Location: [place];  Service: ENT;  Laterality: N/A;   TRACHEOSTOMY TUBE PLACEMENT N/A 02/29/2020   Procedure: TRACHEOSTOMY EXCHANGE;  Surgeon: [name], MD;  Location: [place];  Service: ENT;  Laterality: N/A;   VENTRICULOPERITONEAL SHUNT N/A 03/18/2020   Procedure: RIGHT SHUNT INSERTION VENTRICULAR-PERITONEAL/ BURR HOLE Evacuation of Subdural Hematoma;  Surgeon: [name], MD;  Location: [place];  Service: Neurosurgery;  Laterality: N/A;    Social History: Social History   Socioeconomic History   Marital status: Single    Spouse name: Not on file   Number of children: Not on file   Years of education: Not on file   Highest education level: Not on file  Occupational  History   Not on file  Tobacco Use   Smoking status: Former   Smokeless tobacco: Never  Vaping Use   Vaping Use: Never used  Substance and Sexual Activity   Alcohol use: Not Currently   Drug use: Not Currently   Sexual activity: Not on file  Other Topics Concern   Not on file  Social History Narrative   Not on file   Social Determinants of Health   Financial Resource Strain: Not on file  Food Insecurity: Not on file  Transportation Needs: Not on file  Physical Activity: Not on file  Stress: Not on file  Social Connections: Not on file  Intimate Partner Violence: Not on file    Family History: Family History  Problem Relation Age of Onset   Breast cancer Sister    Ovarian cancer Sister    Lung cancer Maternal Grandmother    Lung cancer Daughter     Current Medications:  Current Outpatient Medications:    acetaminophen (TYLENOL) 325 MG tablet, Place 2 tablets (650 mg total) into feeding tube every 6 (six) hours as needed for mild pain., Disp: , Rfl:    ALPRAZolam (XANAX) 0.5 MG tablet, Take 0.5 mg by mouth 3 (three) times daily as needed., Disp: , Rfl:    amLODipine (NORVASC) 5 MG tablet, Place 1 tablet (5 mg total) into feeding tube daily., Disp: , Rfl:    anastrozole (ARIMIDEX) 1 MG tablet, Take 1 tablet (1 mg total) by mouth daily., Disp: 30 tablet, Rfl: 6   chlorhexidine (PERIDEX) 0.12 % solution, 15 mLs by Mouth Rinse route 2 (two) times daily., Disp: 120 mL, Rfl: 0   DULoxetine (CYMBALTA) 20 MG capsule, Take 20 mg by mouth daily., Disp: , Rfl:    fluticasone (FLONASE) 50 MCG/ACT nasal spray, Place 1 spray into both nostrils 2 (two) times daily., Disp: , Rfl: 2   glycopyrrolate (ROBINUL) 1 MG tablet, Take 1 tablet (1 mg total) by mouth 2 (two) times daily. (Patient taking differently: Place 1 mg into feeding tube 2 (two) times daily.), Disp: , Rfl:    guaiFENesin (ROBITUSSIN) 100 MG/5ML liquid, Place 10 mLs into feeding tube every 8 (eight) hours., Disp: 120 mL, Rfl:  0   ipratropium-albuterol (DUONEB) 0.5-2.5 (3) MG/3ML SOLN, Take 3 mLs by nebulization every 4 (four) hours as needed., Disp: 360 mL, Rfl:    levETIRAcetam (KEPPRA) 100 MG/ML solution, Place 7.5 mLs (750 mg total) into feeding tube 2 (  two) times daily., Disp: 473 mL, Rfl: 12   levothyroxine (SYNTHROID) 100 MCG tablet, Place 1 tablet (100 mcg total) into feeding tube daily at 6 (six) AM., Disp: 30 tablet, Rfl: 3   Nutritional Supplements (FEEDING SUPPLEMENT, OSMOLITE 1.2 CAL,) LIQD, Place 355 mLs into feeding tube 5 (five) times daily., Disp: , Rfl: 0   polyethylene glycol (MIRALAX / GLYCOLAX) 17 g packet, Place 17 g into feeding tube 2 (two) times daily., Disp: 14 each, Rfl: 0   senna (SENOKOT) 8.6 MG TABS tablet, Place 1 tablet (8.6 mg total) into feeding tube daily., Disp: 120 tablet, Rfl: 0   Water For Irrigation, Sterile (FREE WATER) SOLN, Place 150 mLs into feeding tube every 6 (six) hours., Disp: , Rfl:    QUEtiapine (SEROQUEL) 50 MG tablet, Place 1 tablet (50 mg total) into feeding tube 2 (two) times daily., Disp: 60 tablet, Rfl: 0   Allergies: No Known Allergies  REVIEW OF SYSTEMS:   Review of Systems  Constitutional:  Negative for chills, fatigue and fever.  HENT:   Negative for lump/mass, mouth sores, nosebleeds, sore throat and trouble swallowing.   Eyes:  Negative for eye problems.  Respiratory:  Positive for cough. Negative for shortness of breath.   Cardiovascular:  Negative for chest pain, leg swelling and palpitations.  Gastrointestinal:  Negative for abdominal pain, constipation, diarrhea, nausea and vomiting.  Genitourinary:  Negative for bladder incontinence, difficulty urinating, dysuria, frequency, hematuria and nocturia.   Musculoskeletal:  Negative for arthralgias, back pain, flank pain, myalgias and neck pain.  Skin:  Negative for itching and rash.  Neurological:  Negative for dizziness, headaches and numbness.  Hematological:  Does not bruise/bleed easily.   Psychiatric/Behavioral:  Negative for depression, sleep disturbance and suicidal ideas. The patient is nervous/anxious.   All other systems reviewed and are negative.    VITALS:   Blood pressure (!) 129/94, pulse 88, temperature 99.1 F (37.3 C), temperature source Oral, resp. rate 16, weight 162 lb (73.5 kg), SpO2 95 %.  Wt Readings from Last 3 Encounters:  05/27/22 162 lb (73.5 kg)  03/18/22 145 lb (65.8 kg)  12/21/21 149 lb 9.6 oz (67.9 kg)    Body mass index is 26.15 kg/m.  Performance status (ECOG): 1 - Symptomatic but completely ambulatory  PHYSICAL EXAM:   Physical Exam Vitals and nursing note reviewed. Exam conducted with a chaperone present.  Constitutional:      Appearance: Normal appearance.  Cardiovascular:     Rate and Rhythm: Normal rate and regular rhythm.     Pulses: Normal pulses.     Heart sounds: Normal heart sounds.  Pulmonary:     Effort: Pulmonary effort is normal.     Breath sounds: Normal breath sounds.  Chest:     Comments: Left breast mass in the outer quadrant  left breast mass axillary Abdominal:     Palpations: Abdomen is soft. There is no hepatomegaly, splenomegaly or mass.     Tenderness: There is no abdominal tenderness.  Musculoskeletal:     Right lower leg: No edema.     Left lower leg: No edema.  Lymphadenopathy:     Cervical: No cervical adenopathy.     Right cervical: No superficial, deep or posterior cervical adenopathy.    Left cervical: No superficial, deep or posterior cervical adenopathy.     Upper Body:     Right upper body: No supraclavicular or axillary adenopathy.     Left upper body: No supraclavicular or axillary adenopathy.  Neurological:     General: No focal deficit present.     Mental Status: She is alert and oriented to person, place, and time.  Psychiatric:        Mood and Affect: Mood normal.        Behavior: Behavior normal.     LABS:      Latest Ref Rng & Units 04/20/2022   11:47 AM 12/15/2021     8:27 AM 06/02/2021    4:33 AM  CBC  WBC 4.0 - 10.5 K/uL 5.0  4.4  6.1   Hemoglobin 12.0 - 15.0 g/dL 12.6  13.5  13.3   Hematocrit 36.0 - 46.0 % 40.4  42.0  41.8   Platelets 150 - 400 K/uL 143  133  141       Latest Ref Rng & Units 04/20/2022   11:47 AM 12/15/2021    8:27 AM 06/02/2021    4:33 AM  CMP  Glucose 70 - 99 mg/dL 159  112  84   BUN 8 - 23 mg/dL '25  26  17   '$ Creatinine 0.44 - 1.00 mg/dL 0.68  0.59  0.55   Sodium 135 - 145 mmol/L 140  142  143   Potassium 3.5 - 5.1 mmol/L 4.0  3.7  4.0   Chloride 98 - 111 mmol/L 103  106  106   CO2 22 - 32 mmol/L '28  29  28   '$ Calcium 8.9 - 10.3 mg/dL 9.4  9.7  9.7   Total Protein 6.5 - 8.1 g/dL 7.6  8.1    Total Bilirubin 0.3 - 1.2 mg/dL 0.6  0.3    Alkaline Phos 38 - 126 U/L 72  75    AST 15 - 41 U/L 22  23    ALT 0 - 44 U/L 22  26       No results found for: "CEA1", "CEA" / No results found for: "CEA1", "CEA" No results found for: "PSA1" No results found for: "[id number]" No results found for: "CAN125"  No results found for: "TOTALPROTELP", "ALBUMINELP", "A1GS", "A2GS", "BETS", "BETA2SER", "GAMS", "MSPIKE", "SPEI" Lab Results  Component Value Date   TIBC 258 08/08/2020   FERRITIN 134 08/08/2020   IRONPCTSAT 28 08/08/2020   No results found for: "LDH"   STUDIES:   US BREAST LTD UNI LEFT INC AXILLA  Result Date: 05/18/2022 CLINICAL DATA:  Follow-up of a known left breast cancer, currently undergoing treatment with anastrozole. EXAM: ULTRASOUND OF THE LEFT BREAST COMPARISON:  Prior exams, previous ultrasound, 12/15/2021 and 06/09/2021. FINDINGS: Targeted left breast ultrasound is performed, showing an irregular hypoechoic mass at 2 o'clock, 3 cm the nipple, currently measuring 3.2 x 1.7 x 2.7 cm, 2.4 x 1.5 x 2.2 cm on the most recent prior study and 4.4 x 2.3 x 3.3 cm on the original ultrasound. The abnormal left axillary lymph nodes are unchanged from the prior exams. IMPRESSION: 1. Known left breast carcinoma demonstrating an interval  increase in size from the 12/15/2021 exam, but still decreased in size when compared to the baseline exam from 06/09/2021. No change in the abnormal left axillary lymph nodes. RECOMMENDATION: 1. Follow-up ultrasound as clinically guided. Based on the interval increase, recommend repeat ultrasound in 3-6 months unless surgical resection is performed. I have discussed the findings and recommendations with the patient. If applicable, a reminder letter will be sent to the patient regarding the next appointment. BI-RADS CATEGORY  6: Known biopsy-proven malignancy.  At Electronically Signed   By: Shanon Brow  Ormond M.D.   On: 05/18/2022 11:40

## 2022-05-27 NOTE — Patient Instructions (Addendum)
[place] at [facility] Discharge Instructions   You were seen and examined today by Dr. Delton Coombes.  He reviewed the results of your breast ultrasound which shows that the tumor has grown some since last exam.   Dr. Raliegh Ip recommends you see Dr Arnoldo Morale (surgeon) to have lumpectomy. This would be the way to definitively cure the cancer.   We will also arrange for you to have a PET scan to make sure the cancer has not spread further prior to having surgery.    Thank you for choosing [place] at [facility] to provide your oncology and hematology care.  To afford each patient quality time with our provider, please arrive at least 15 minutes before your scheduled appointment time.   If you have a lab appointment with the [facility] please come in thru the Main Entrance and check in at the main information desk.  You need to re-schedule your appointment should you arrive 10 or more minutes late.  We strive to give you quality time with our providers, and arriving late affects you and other patients whose appointments are after yours.  Also, if you no show three or more times for appointments you may be dismissed from the clinic at the providers discretion.     Again, thank you for choosing [facility]  Our hope is that these requests will decrease the amount of time that you wait before being seen by our physicians.       _____________________________________________________________  Should you have questions after your visit to [facility], please contact our office at [phone] and follow the prompts.  Our office hours are 8:00 a.m. and 4:30 p.m. Monday - Friday.  Please note that voicemails left after 4:00 p.m. may not be returned until the following business day.  We are closed weekends and major holidays.  You do have access to a nurse 24-7, just call the main number to the clinic [phone] and do not press any  options, hold on the line and a nurse will answer the phone.    For prescription refill requests, have your pharmacy contact our office and allow 72 hours.    Due to Covid, you will need to wear a mask upon entering the hospital. If you do not have a mask, a mask will be given to you at the Main Entrance upon arrival. For doctor visits, patients may have 1 support person age 65 or older with them. For treatment visits, patients can not have anyone with them due to social distancing guidelines and our immunocompromised population.

## 2022-06-03 ENCOUNTER — Encounter (HOSPITAL_COMMUNITY)
Admission: RE | Admit: 2022-06-03 | Discharge: 2022-06-03 | Disposition: A | Discharge status: Home / Self Care | Payer: Medicaid Other | Source: Ambulatory Visit | Attending: Hematology | Admitting: Hematology

## 2022-06-03 DIAGNOSIS — C50912 Malignant neoplasm of unspecified site of left female breast: Secondary | ICD-10-CM | POA: Insufficient documentation

## 2022-06-03 MED ORDER — FLUDEOXYGLUCOSE F - 18 (FDG) INJECTION
9.0700 | Freq: Once | INTRAVENOUS | Status: AC | PRN
  Administered 2022-06-03: 9.07 via INTRAVENOUS

## 2022-06-07 NOTE — Progress Notes (Signed)
[facility] [address], [place] [address]    Clinic Day:  06/08/2022  Referring physician: Hal Morales, *  Patient Care Team: Hal Morales, DO as PCP - General (Family Medicine) Derek Jack, MD as Medical Oncologist (Medical Oncology)   ASSESSMENT & PLAN:   Assessment: 1.  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 - Left breast biopsy on 06/16/2021 - Pathology: Invasive ductal carcinoma, grade 2/3, ER 80% positive, weak to moderate staining, PR 30% positive, moderate staining, Ki-67-1%.  HER2 was 2+ by IHC and negative by FISH. - PET scan on [id number]: Hypermetabolic left breast mass and enlarged hypermetabolic left axillary lymph nodes extending to the subpectoral nodal station.  Skin thickening in the left axilla.  No distant metastatic disease. - Anastrozole from 06/29/2021 through 06/08/2022 with progression after initial response - PET scan (06/03/2022): New left supraclavicular lymph node and progressive left axillary and subpectoral lymph node with persistent left breast mass.  2.  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  3.  Social/family history: - She used to live with her daughter prior to recent placement at [facility] since June/July of 2022.  She used to work at [employer].  Quit smoking 2 years ago.  Smoked half pack per day for 45 years. - Sister had breast cancer.  Daughter had lung cancer.  Mother had lung cancer.  Another sister had ovarian cancer.    Plan: 1.  Stage IIIb (T2 N3 M0) left breast IDC, ER/PR positive and HER2 negative: - We reviewed PET scan from 06/03/2022 which showed new left supraclavicular lymph node with progressive left subpectoral and axillary lymph nodes with persistent left breast mass.  No  distant metastatic disease. - She has progressed on anastrozole.  Will discontinue it. - We have discussed further options with Faslodex.  CDK 4 inhibitors or PIK 3 inhibitors have not been approved yet in conjunction with antiestrogen therapy in the neoadjuvant setting. - We discussed Faslodex 500 mg on days 1, 15, 29 and then every 28 days. - We discussed side effects in detail. - Recommend follow-up in 12 weeks with repeat PET scan to evaluate response.  Will also check tumor marker at that time. - She was told to keep appointment with Dr. Arnoldo Morale.   2.  Bone health: - DEXA scan on 07/06/2021 with T-score -0.1. - Last vitamin D level was normal at 43.  Continue calcium and vitamin D supplements.  Orders Placed This Encounter  Procedures   NM PET Image Restag (PS) Skull Base To Thigh    Standing Status:   Future    Standing Expiration Date:   06/08/2023    Order Specific Question:   If indicated for the ordered procedure, I authorize the administration of a radiopharmaceutical per Radiology protocol    Answer:   Yes    Order Specific Question:   Preferred imaging location?    Answer:   [facility]   CBC with Differential    Standing Status:   Future    Standing Expiration Date:   06/08/2023   Comprehensive metabolic panel    Standing Status:   Future    Standing Expiration Date:   06/08/2023   Cancer antigen 27.29    Standing Status:   Future  Standing Expiration Date:   06/08/2023   Cancer antigen 15-3    Standing Status:   Future    Standing Expiration Date:   06/08/2023     I,Alexis Herring,acting as a scribe for Derek Jack, MD.,have documented all relevant documentation on the behalf of Derek Jack, MD,as directed by  Derek Jack, MD while in the presence of Derek Jack, MD.  I, Derek Jack MD, have reviewed the above documentation for accuracy and completeness, and I agree with the above.    Derek Jack, MD   3/12/20246:10  PM  CHIEF COMPLAINT:   Diagnosis: left breast IDC, ER positive.   Cancer Staging  Breast cancer, left breast ([place]) Staging form: Breast, AJCC 8th Edition - Clinical stage from 06/28/2021: Stage IIIB (cT2, cN3, cM0, G2, ER+, PR+, HER2-) - Unsigned    Prior Therapy: Anastrozole  Current Therapy: Faslodex   HISTORY OF PRESENT ILLNESS:   Oncology History  Breast cancer, left breast ([place])  06/28/2021 Initial Diagnosis   Breast cancer, left breast ([place])   06/15/2022 -  Chemotherapy   Patient is on Treatment Plan : BREAST Fulvestrant q28d        INTERVAL HISTORY:   Sue is a 67 y.o. female presenting to clinic today for follow up of  left breast IDC, ER positive. She was last seen by me on 05/27/22.  Today, she states that she is doing well overall. Her appetite level is at 25%. Her energy level is at 25%.  She denies any new onset pains.  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[facility])    Seizures ([place])    Status post insertion of percutaneous endoscopic gastrostomy (PEG) tube ([place])    Stroke ([place])    Subdural hematoma ([place])    Tracheostomy dependent ([place])    Tracheostomy present [facility])     Surgical History: Past Surgical History:  Procedure Laterality Date   BURR HOLE N/A 03/18/2020   Procedure: Haskell Flirt;  Surgeon: [name], MD;  Location: [facility];  Service: Neurosurgery;  Laterality: N/A;   CRANIOTOMY Left 01/30/2020   Procedure: LEFT FAR LATERAL CRANIOTOMY FOR ANEURYSM CLIPPING;  Surgeon: [name], MD;  Location: [facility];  Service: Neurosurgery;  Laterality: Left;   DIRECT LARYNGOSCOPY N/A 02/29/2020   Procedure: DIRECT LARYNGOSCOPY;  Surgeon: [name], MD;  Location: [place];  Service: ENT;  Laterality: N/A;   DIRECT LARYNGOSCOPY N/A 10/03/2020   Procedure: DIRECT LARYNGOSCOPY w/esophagascopy;  Surgeon:  [name], MD;  Location: [facility];  Service: ENT;  Laterality: N/A;   INTUBATION-ENDOTRACHEAL WITH TRACHEOSTOMY STANDBY N/A 12/14/2020   Procedure: Warrick Parisian WITH TRACHEOSTOMY STANDBY;  Surgeon: Carloyn Manner, MD;  Location: [facility];  Service: ENT;  Laterality: N/A;   IR ANGIO INTRA EXTRACRAN SEL INTERNAL CAROTID BILAT MOD SED  01/30/2020   IR ANGIO VERTEBRAL SEL VERTEBRAL UNI L MOD SED  01/30/2020   IR CM INJ ANY COLONIC TUBE W/FLUORO  07/03/2020   IR GASTROSTOMY TUBE MOD SED  02/22/2020   IR PATIENT EVAL TECH 0-60 MINS  02/11/2022   IR [place] GASTRO/COLONIC TUBE PERCUT W/FLUORO  07/03/2020   LAPAROSCOPIC REVISION VENTRICULAR-PERITONEAL (V-P) SHUNT N/A 03/18/2020   Procedure: LAPAROSCOPIC REVISION VENTRICULAR-PERITONEAL (V-P) SHUNT;  Surgeon: [name], MD;  Location: [facility];  Service: General;  Laterality: N/A;   RADIOLOGY WITH ANESTHESIA  N/A 01/30/2020   Procedure: IR WITH ANESTHESIA;  Surgeon: [name], MD;  Location: [facility];  Service: Radiology;  Laterality: N/A;   THYROIDECTOMY N/A 02/08/2020   Procedure: THYROIDECTOMY;  Surgeon: [name], MD;  Location: [facility];  Service: ENT;  Laterality: N/A;   TRACHEOSTOMY TUBE PLACEMENT N/A 02/08/2020   Procedure: TRACHEOSTOMY;  Surgeon: [name], MD;  Location: [place];  Service: ENT;  Laterality: N/A;   TRACHEOSTOMY TUBE PLACEMENT N/A 02/29/2020   Procedure: TRACHEOSTOMY EXCHANGE;  Surgeon: [name], MD;  Location: [place];  Service: ENT;  Laterality: N/A;   VENTRICULOPERITONEAL SHUNT N/A 03/18/2020   Procedure: RIGHT SHUNT INSERTION VENTRICULAR-PERITONEAL/ BURR HOLE Evacuation of Subdural Hematoma;  Surgeon: [name], MD;  Location: [facility];  Service: Neurosurgery;  Laterality: N/A;    Social History: Social History   Socioeconomic History   Marital status: Single    Spouse name: Not on file   Number of children: Not on file   Years of education: Not on file   Highest education level: Not on file   Occupational History   Not on file  Tobacco Use   Smoking status: Former   Smokeless tobacco: Never  Vaping Use   Vaping Use: Never used  Substance and Sexual Activity   Alcohol use: Not Currently   Drug use: Not Currently   Sexual activity: Not on file  Other Topics Concern   Not on file  Social History Narrative   Not on file   Social Determinants of Health   Financial Resource Strain: Not on file  Food Insecurity: Not on file  Transportation Needs: Not on file  Physical Activity: Not on file  Stress: Not on file  Social Connections: Not on file  Intimate Partner Violence: Not on file    Family History: Family History  Problem Relation Age of Onset   Breast cancer Sister    Ovarian cancer Sister    Lung cancer Maternal Grandmother    Lung cancer Daughter     Current Medications:  Current Outpatient Medications:    acetaminophen (TYLENOL) 325 MG tablet, Place 2 tablets (650 mg total) into feeding tube every 6 (six) hours as needed for mild pain., Disp: , Rfl:    ALPRAZolam (XANAX) 0.5 MG tablet, Take 0.5 mg by mouth 3 (three) times daily as needed., Disp: , Rfl:    amLODipine (NORVASC) 5 MG tablet, Place 1 tablet (5 mg total) into feeding tube daily., Disp: , Rfl:    anastrozole (ARIMIDEX) 1 MG tablet, Take 1 tablet (1 mg total) by mouth daily., Disp: 30 tablet, Rfl: 6   chlorhexidine (PERIDEX) 0.12 % solution, 15 mLs by Mouth Rinse route 2 (two) times daily., Disp: 120 mL, Rfl: 0   DULoxetine (CYMBALTA) 20 MG capsule, Take 20 mg by mouth daily., Disp: , Rfl:    fluticasone (FLONASE) 50 MCG/ACT nasal spray, Place 1 spray into both nostrils 2 (two) times daily., Disp: , Rfl: 2   glycopyrrolate (ROBINUL) 1 MG tablet, Take 1 tablet (1 mg total) by mouth 2 (two) times daily. (Patient taking differently: Place 1 mg into feeding tube 2 (two) times daily.), Disp: , Rfl:    guaiFENesin (ROBITUSSIN) 100 MG/5ML liquid, Place 10 mLs into feeding tube every 8 (eight) hours.,  Disp: 120 mL, Rfl: 0   ipratropium-albuterol (DUONEB) 0.5-2.5 (3) MG/3ML SOLN, Take 3 mLs by nebulization every 4 (four) hours as needed., Disp: 360 mL, Rfl:    levETIRAcetam (KEPPRA) 100 MG/ML solution, Place 7.5 mLs (  750 mg total) into feeding tube 2 (two) times daily., Disp: 473 mL, Rfl: 12   levothyroxine (SYNTHROID) 100 MCG tablet, Place 1 tablet (100 mcg total) into feeding tube daily at 6 (six) AM., Disp: 30 tablet, Rfl: 3   Nutritional Supplements (FEEDING SUPPLEMENT, OSMOLITE 1.2 CAL,) LIQD, Place 355 mLs into feeding tube 5 (five) times daily., Disp: , Rfl: 0   polyethylene glycol (MIRALAX / GLYCOLAX) 17 g packet, Place 17 g into feeding tube 2 (two) times daily., Disp: 14 each, Rfl: 0   senna (SENOKOT) 8.6 MG TABS tablet, Place 1 tablet (8.6 mg total) into feeding tube daily., Disp: 120 tablet, Rfl: 0   Water For Irrigation, Sterile (FREE WATER) SOLN, Place 150 mLs into feeding tube every 6 (six) hours., Disp: , Rfl:    QUEtiapine (SEROQUEL) 50 MG tablet, Place 1 tablet (50 mg total) into feeding tube 2 (two) times daily., Disp: 60 tablet, Rfl: 0   Allergies: No Known Allergies  REVIEW OF SYSTEMS:   Review of Systems  Constitutional:  Negative for chills, fatigue and fever.  HENT:   Negative for lump/mass, mouth sores, nosebleeds, sore throat and trouble swallowing.   Eyes:  Negative for eye problems.  Respiratory:  Positive for shortness of breath. Negative for cough.   Cardiovascular:  Negative for chest pain, leg swelling and palpitations.  Gastrointestinal:  Negative for abdominal pain, constipation, diarrhea, nausea and vomiting.  Genitourinary:  Positive for frequency. Negative for bladder incontinence, difficulty urinating, dysuria, hematuria and nocturia.   Musculoskeletal:  Negative for arthralgias, back pain, flank pain, myalgias and neck pain.  Skin:  Negative for itching and rash.  Neurological:  Positive for numbness. Negative for dizziness and headaches.   Hematological:  Does not bruise/bleed easily.  Psychiatric/Behavioral:  Negative for depression, sleep disturbance and suicidal ideas. The patient is nervous/anxious.   All other systems reviewed and are negative.    VITALS:   Blood pressure 108/86, pulse (!) 104, temperature 98.5 F (36.9 C), temperature source Tympanic, resp. rate 19, height '5\' 6"'$  (1.676 m), weight 162 lb (73.5 kg), SpO2 99 %.  Wt Readings from Last 3 Encounters:  06/08/22 161 lb (73 kg)  06/08/22 162 lb (73.5 kg)  05/27/22 162 lb (73.5 kg)    Body mass index is 26.15 kg/m.  Performance status (ECOG): 1 - Symptomatic but completely ambulatory  PHYSICAL EXAM:   Physical Exam Vitals and nursing note reviewed. Exam conducted with a chaperone present.  Constitutional:      Appearance: Normal appearance.  Cardiovascular:     Rate and Rhythm: Normal rate and regular rhythm.     Pulses: Normal pulses.     Heart sounds: Normal heart sounds.  Pulmonary:     Effort: Pulmonary effort is normal.     Breath sounds: Normal breath sounds.  Abdominal:     Palpations: Abdomen is soft. There is no hepatomegaly, splenomegaly or mass.     Tenderness: There is no abdominal tenderness.  Musculoskeletal:     Right lower leg: No edema.     Left lower leg: No edema.  Lymphadenopathy:     Cervical: No cervical adenopathy.     Right cervical: No superficial, deep or posterior cervical adenopathy.    Left cervical: No superficial, deep or posterior cervical adenopathy.     Upper Body:     Right upper body: No supraclavicular or axillary adenopathy.     Left upper body: No supraclavicular or axillary adenopathy.  Neurological:  General: No focal deficit present.     Mental Status: She is alert and oriented to person, place, and time.  Psychiatric:        Mood and Affect: Mood normal.        Behavior: Behavior normal.     LABS:      Latest Ref Rng & Units 04/20/2022   11:47 AM 12/15/2021    8:27 AM 06/02/2021    4:33  AM  CBC  WBC 4.0 - 10.5 K/uL 5.0  4.4  6.1   Hemoglobin 12.0 - 15.0 g/dL 12.6  13.5  13.3   Hematocrit 36.0 - 46.0 % 40.4  42.0  41.8   Platelets 150 - 400 K/uL 143  133  141       Latest Ref Rng & Units 04/20/2022   11:47 AM 12/15/2021    8:27 AM 06/02/2021    4:33 AM  CMP  Glucose 70 - 99 mg/dL 159  112  84   BUN 8 - 23 mg/dL '25  26  17   '$ Creatinine 0.44 - 1.00 mg/dL 0.68  0.59  0.55   Sodium 135 - 145 mmol/L 140  142  143   Potassium 3.5 - 5.1 mmol/L 4.0  3.7  4.0   Chloride 98 - 111 mmol/L 103  106  106   CO2 22 - 32 mmol/L '28  29  28   '$ Calcium 8.9 - 10.3 mg/dL 9.4  9.7  9.7   Total Protein 6.5 - 8.1 g/dL 7.6  8.1    Total Bilirubin 0.3 - 1.2 mg/dL 0.6  0.3    Alkaline Phos 38 - 126 U/L 72  75    AST 15 - 41 U/L 22  23    ALT 0 - 44 U/L 22  26       No results found for: "CEA1", "CEA" / No results found for: "CEA1", "CEA" No results found for: "PSA1" No results found for: "[id number]" No results found for: "CAN125"  No results found for: "TOTALPROTELP", "ALBUMINELP", "A1GS", "A2GS", "BETS", "BETA2SER", "GAMS", "MSPIKE", "SPEI" Lab Results  Component Value Date   TIBC 258 08/08/2020   FERRITIN 134 08/08/2020   IRONPCTSAT 28 08/08/2020   No results found for: "LDH"   STUDIES:   NM PET Image Restag (PS) Skull Base To Thigh  Result Date: 06/04/2022 CLINICAL DATA:  Subsequent treatment strategy for left breast cancer diagnosed 1 year ago. Increasing left breast mass with persistent axillary adenopathy on ultrasound. EXAM: NUCLEAR MEDICINE PET SKULL BASE TO THIGH TECHNIQUE: 9.07 mCi F-18 FDG was injected intravenously. Full-ring PET imaging was performed from the skull base to thigh after the radiotracer. CT data was obtained and used for attenuation correction and anatomic localization. Fasting blood glucose: 104 mg/dl COMPARISON:  PET-CT 06/11/2021 FINDINGS: Mediastinal blood pool activity: SUV max 2.4 NECK: There is a new hypermetabolic left supraclavicular lymph node which  measures 1.2 cm short axis on image 65/3 and has an SUV max of 5.3. No other hypermetabolic cervical lymph nodes are identified. No suspicious activity identified within the pharyngeal mucosal space. Incidental CT findings: Tracheostomy in place. CHEST: There is persistent extensive hypermetabolic left axillary and subpectoral adenopathy. 1.4 cm subpectoral node on image 76/3 has an SUV max of 6.2. In inferior left axillary node measuring 1.6 cm on image 91/3 has an SUV max of 6.8. Compared with the prior PET-CT, several of these lymph nodes have enlarged and developed increased metabolic activity. No hypermetabolic mediastinal, hilar or internal  mammary adenopathy. No hypermetabolic pulmonary activity or suspicious nodularity. There is persistent hypermetabolic activity within a lateral left breast mass which measures 2.6 x 1.9 cm on image 121/3 and has an SUV max of 5.8 (previously 5.1). Incidental CT findings: Mild aortic and coronary artery atherosclerosis. Centrilobular emphysema with mild dependent atelectasis in both lungs. ABDOMEN/PELVIS: There is no hypermetabolic activity within the liver, adrenal glands, spleen or pancreas. There is no hypermetabolic nodal activity in the abdomen or pelvis. Bowel activity is within physiologic limits. There is new hypermetabolic activity within the uterus (SUV max 8.1) likely corresponding with a degenerating fibroid. Incidental CT findings: Percutaneous G-tube and ventricular peritoneal shunt remain in place. The uterus is enlarged by multiple degenerating and partially calcified fibroids, similar to previous study. SKELETON: There is no hypermetabolic activity to suggest osseous metastatic disease. Incidental CT findings: Lumbar spondylosis. IMPRESSION: 1. Persistent hypermetabolic activity within the lateral left breast mass consistent with residual viable tumor. 2. Progressive hypermetabolic left axillary and subpectoral adenopathy consistent with metastatic disease.  New hypermetabolic left supraclavicular lymph node consistent with metastatic disease. 3. No evidence of metastatic disease in the abdomen or pelvis. 4. New hypermetabolic activity within the uterus, likely corresponding with a degenerating fibroid. 5. Aortic Atherosclerosis (ICD10-I70.0) and Emphysema (ICD10-J43.9). Electronically Signed   By: Richardean Sale M.D.   On: 06/04/2022 15:11   US BREAST LTD UNI LEFT INC AXILLA  Result Date: 05/18/2022 CLINICAL DATA:  Follow-up of a known left breast cancer, currently undergoing treatment with anastrozole. EXAM: ULTRASOUND OF THE LEFT BREAST COMPARISON:  Prior exams, previous ultrasound, 12/15/2021 and 06/09/2021. FINDINGS: Targeted left breast ultrasound is performed, showing an irregular hypoechoic mass at 2 o'clock, 3 cm the nipple, currently measuring 3.2 x 1.7 x 2.7 cm, 2.4 x 1.5 x 2.2 cm on the most recent prior study and 4.4 x 2.3 x 3.3 cm on the original ultrasound. The abnormal left axillary lymph nodes are unchanged from the prior exams. IMPRESSION: 1. Known left breast carcinoma demonstrating an interval increase in size from the 12/15/2021 exam, but still decreased in size when compared to the baseline exam from 06/09/2021. No change in the abnormal left axillary lymph nodes. RECOMMENDATION: 1. Follow-up ultrasound as clinically guided. Based on the interval increase, recommend repeat ultrasound in 3-6 months unless surgical resection is performed. I have discussed the findings and recommendations with the patient. If applicable, a reminder letter will be sent to the patient regarding the next appointment. BI-RADS CATEGORY  6: Known biopsy-proven malignancy.  At Electronically Signed   By: Lajean Manes M.D.   On: 05/18/2022 11:40

## 2022-06-08 ENCOUNTER — Encounter: Payer: Self-pay | Admitting: Hematology

## 2022-06-08 ENCOUNTER — Encounter: Payer: Self-pay | Admitting: General Surgery

## 2022-06-08 ENCOUNTER — Ambulatory Visit (INDEPENDENT_AMBULATORY_CARE_PROVIDER_SITE_OTHER): Payer: Medicaid Other | Admitting: General Surgery

## 2022-06-08 ENCOUNTER — Inpatient Hospital Stay: Payer: Medicaid Other | Attending: Hematology | Admitting: Hematology

## 2022-06-08 VITALS — BP 108/86 | HR 104 | Temp 98.5°F | Resp 19 | Ht 66.0 in | Wt 162.0 lb

## 2022-06-08 VITALS — BP 138/81 | HR 76 | Temp 97.4°F | Resp 16 | Ht 66.0 in | Wt 161.0 lb

## 2022-06-08 DIAGNOSIS — Z930 Tracheostomy status: Secondary | ICD-10-CM | POA: Diagnosis not present

## 2022-06-08 DIAGNOSIS — Z8041 Family history of malignant neoplasm of ovary: Secondary | ICD-10-CM | POA: Diagnosis not present

## 2022-06-08 DIAGNOSIS — I10 Essential (primary) hypertension: Secondary | ICD-10-CM | POA: Diagnosis not present

## 2022-06-08 DIAGNOSIS — Z801 Family history of malignant neoplasm of trachea, bronchus and lung: Secondary | ICD-10-CM | POA: Insufficient documentation

## 2022-06-08 DIAGNOSIS — Z87891 Personal history of nicotine dependence: Secondary | ICD-10-CM | POA: Diagnosis not present

## 2022-06-08 DIAGNOSIS — Z170 Estrogen receptor positive status [ER+]: Secondary | ICD-10-CM

## 2022-06-08 DIAGNOSIS — C50912 Malignant neoplasm of unspecified site of left female breast: Secondary | ICD-10-CM | POA: Diagnosis not present

## 2022-06-08 DIAGNOSIS — C50412 Malignant neoplasm of upper-outer quadrant of left female breast: Secondary | ICD-10-CM | POA: Insufficient documentation

## 2022-06-08 DIAGNOSIS — Z803 Family history of malignant neoplasm of breast: Secondary | ICD-10-CM | POA: Diagnosis not present

## 2022-06-08 DIAGNOSIS — C50012 Malignant neoplasm of nipple and areola, left female breast: Secondary | ICD-10-CM | POA: Diagnosis not present

## 2022-06-08 DIAGNOSIS — Z5111 Encounter for antineoplastic chemotherapy: Secondary | ICD-10-CM | POA: Diagnosis not present

## 2022-06-08 DIAGNOSIS — Z8673 Personal history of transient ischemic attack (TIA), and cerebral infarction without residual deficits: Secondary | ICD-10-CM | POA: Insufficient documentation

## 2022-06-08 NOTE — Progress Notes (Signed)
START OFF PATHWAY REGIMEN - Breast   [id number]:Fulvestrant 500 mg IM D1,15/D1 q28 Days:   Cycle 1: A cycle is 28 days:     Fulvestrant    Cycles 2 and beyond: A cycle is every 28 days:     Fulvestrant   **Always confirm dose/schedule in your pharmacy ordering system**  Patient Characteristics: Preoperative or Nonsurgical Candidate (Clinical Staging), Neoadjuvant Therapy followed by Surgery, Invasive Disease, Hormonal Therapy, ER Positive, Postmenopausal Therapeutic Status: Preoperative or Nonsurgical Candidate (Clinical Staging) AJCC M Category: cM0 AJCC Grade: G2 Breast Surgical Plan: Neoadjuvant Therapy followed by Surgery ER Status: Positive (+) AJCC 8 Stage Grouping: IIIB HER2 Status: Negative (-) AJCC T Category: cT2 AJCC N Category: cN3c PR Status: Positive (+) Menopausal Status: Postmenopausal Intent of Therapy: Curative Intent, Discussed with Patient

## 2022-06-08 NOTE — Patient Instructions (Signed)
[facility] at [facility] Discharge Instructions   You were seen and examined today by Dr. Delton Coombes.  He reviewed the results of your PET scan. It shows that the breast cancer has spread to local lymph nodes. It has not spread to any distant sites in the body.   We will plan to start you on injections to help shrink the cancer in the lymph nodes to get you ready for surgery.   Return as scheduled.    Thank you for choosing [place] at [facility] to provide your oncology and hematology care.  To afford each patient quality time with our provider, please arrive at least 15 minutes before your scheduled appointment time.   If you have a lab appointment with the [facility] please come in thru the Main Entrance and check in at the main information desk.  You need to re-schedule your appointment should you arrive 10 or more minutes late.  We strive to give you quality time with our providers, and arriving late affects you and other patients whose appointments are after yours.  Also, if you no show three or more times for appointments you may be dismissed from the clinic at the providers discretion.     Again, thank you for choosing [facility].  Our hope is that these requests will decrease the amount of time that you wait before being seen by our physicians.       _____________________________________________________________  Should you have questions after your visit to [facility], please contact our office at [phone] and follow the prompts.  Our office hours are 8:00 a.m. and 4:30 p.m. Monday - Friday.  Please note that voicemails left after 4:00 p.m. may not be returned until the following business day.  We are closed weekends and major holidays.  You do have access to a nurse 24-7, just call the main number to the clinic [phone] and do not press any options, hold on the line and a nurse will answer the  phone.    For prescription refill requests, have your pharmacy contact our office and allow 72 hours.    Due to Covid, you will need to wear a mask upon entering the hospital. If you do not have a mask, a mask will be given to you at the Main Entrance upon arrival. For doctor visits, patients may have 1 support person age 75 or older with them. For treatment visits, patients can not have anyone with them due to social distancing guidelines and our immunocompromised population.

## 2022-06-09 NOTE — Progress Notes (Signed)
Stacy Moran; [id number]; 1955/10/23   HPI Patient is a 67 year old black female who returns back to my care for evaluation and treatment of her left breast cancer.  I last saw her in April 2023.  She has a known large left breast cancer, stage IIIb, ER/PR positive and HER2 negative.  She was started on Anastrozole as she was not felt to be a surgical candidate.  She has multiple medical problems including history of seizures, stroke, subdural hematoma, tracheostomy dependency, status post PEG placement.  She currently resides at a nursing home.  She does ambulate with a walker.  She does occasionally get short of breath. Past Medical History:  Diagnosis Date   Acute respiratory failure ([place])    Hypertension    Prosthetic eye globe    Ruptured aneurysm of artery (HCC)    Seizures ([place])    Status post insertion of percutaneous endoscopic gastrostomy (PEG) tube ([place])    Stroke ([place])    Subdural hematoma ([place])    Tracheostomy dependent ([place])    Tracheostomy present [facility])     Past Surgical History:  Procedure Laterality Date   BURR HOLE N/A 03/18/2020   Procedure: Haskell Flirt;  Surgeon: [name], MD;  Location: [facility];  Service: Neurosurgery;  Laterality: N/A;   CRANIOTOMY Left 01/30/2020   Procedure: LEFT FAR LATERAL CRANIOTOMY FOR ANEURYSM CLIPPING;  Surgeon: [name], MD;  Location: [place];  Service: Neurosurgery;  Laterality: Left;   DIRECT LARYNGOSCOPY N/A 02/29/2020   Procedure: DIRECT LARYNGOSCOPY;  Surgeon: [name], MD;  Location: [place];  Service: ENT;  Laterality: N/A;   DIRECT LARYNGOSCOPY N/A 10/03/2020   Procedure: DIRECT LARYNGOSCOPY w/esophagascopy;  Surgeon: [name], MD;  Location: [place];  Service: ENT;  Laterality: N/A;   INTUBATION-ENDOTRACHEAL WITH TRACHEOSTOMY STANDBY N/A 12/14/2020   Procedure: Warrick Parisian WITH TRACHEOSTOMY STANDBY;  Surgeon: Carloyn Manner, MD;  Location: [facility];  Service: ENT;  Laterality: N/A;   IR ANGIO INTRA  EXTRACRAN SEL INTERNAL CAROTID BILAT MOD SED  01/30/2020   IR ANGIO VERTEBRAL SEL VERTEBRAL UNI L MOD SED  01/30/2020   IR CM INJ ANY COLONIC TUBE W/FLUORO  07/03/2020   IR GASTROSTOMY TUBE MOD SED  02/22/2020   IR PATIENT EVAL TECH 0-60 MINS  02/11/2022   IR [place] GASTRO/COLONIC TUBE PERCUT W/FLUORO  07/03/2020   LAPAROSCOPIC REVISION VENTRICULAR-PERITONEAL (V-P) SHUNT N/A 03/18/2020   Procedure: LAPAROSCOPIC REVISION VENTRICULAR-PERITONEAL (V-P) SHUNT;  Surgeon: [name], MD;  Location: [facility];  Service: General;  Laterality: N/A;   RADIOLOGY WITH ANESTHESIA N/A 01/30/2020   Procedure: IR WITH ANESTHESIA;  Surgeon: [name], MD;  Location: [facility];  Service: Radiology;  Laterality: N/A;   THYROIDECTOMY N/A 02/08/2020   Procedure: THYROIDECTOMY;  Surgeon: [name], MD;  Location: [facility];  Service: ENT;  Laterality: N/A;   TRACHEOSTOMY TUBE PLACEMENT N/A 02/08/2020   Procedure: TRACHEOSTOMY;  Surgeon: [name], MD;  Location: [facility];  Service: ENT;  Laterality: N/A;   TRACHEOSTOMY TUBE PLACEMENT N/A 02/29/2020   Procedure: TRACHEOSTOMY EXCHANGE;  Surgeon: [name], MD;  Location: [place];  Service: ENT;  Laterality: N/A;   VENTRICULOPERITONEAL SHUNT N/A 03/18/2020   Procedure: RIGHT SHUNT INSERTION VENTRICULAR-PERITONEAL/ BURR HOLE Evacuation of Subdural Hematoma;  Surgeon: [name], MD;  Location: [facility];  Service: Neurosurgery;  Laterality: N/A;    Family History  Problem Relation Age of Onset   Breast cancer Sister    Ovarian cancer Sister    Lung cancer Maternal Grandmother  Lung cancer Daughter     Current Outpatient Medications on File Prior to Visit  Medication Sig Dispense Refill   acetaminophen (TYLENOL) 325 MG tablet Place 2 tablets (650 mg total) into feeding tube every 6 (six) hours as needed for mild pain.     ALPRAZolam (XANAX) 0.5 MG tablet Take 0.5 mg by mouth 3 (three) times daily as needed.     amLODipine (NORVASC) 5 MG tablet Place 1 tablet  (5 mg total) into feeding tube daily.     anastrozole (ARIMIDEX) 1 MG tablet Take 1 tablet (1 mg total) by mouth daily. 30 tablet 6   chlorhexidine (PERIDEX) 0.12 % solution 15 mLs by Mouth Rinse route 2 (two) times daily. 120 mL 0   DULoxetine (CYMBALTA) 20 MG capsule Take 20 mg by mouth daily.     fluticasone (FLONASE) 50 MCG/ACT nasal spray Place 1 spray into both nostrils 2 (two) times daily.  2   glycopyrrolate (ROBINUL) 1 MG tablet Take 1 tablet (1 mg total) by mouth 2 (two) times daily. (Patient taking differently: Place 1 mg into feeding tube 2 (two) times daily.)     guaiFENesin (ROBITUSSIN) 100 MG/5ML liquid Place 10 mLs into feeding tube every 8 (eight) hours. 120 mL 0   ipratropium-albuterol (DUONEB) 0.5-2.5 (3) MG/3ML SOLN Take 3 mLs by nebulization every 4 (four) hours as needed. 360 mL    levETIRAcetam (KEPPRA) 100 MG/ML solution Place 7.5 mLs (750 mg total) into feeding tube 2 (two) times daily. 473 mL 12   levothyroxine (SYNTHROID) 100 MCG tablet Place 1 tablet (100 mcg total) into feeding tube daily at 6 (six) AM. 30 tablet 3   Nutritional Supplements (FEEDING SUPPLEMENT, OSMOLITE 1.2 CAL,) LIQD Place 355 mLs into feeding tube 5 (five) times daily.  0   polyethylene glycol (MIRALAX / GLYCOLAX) 17 g packet Place 17 g into feeding tube 2 (two) times daily. 14 each 0   senna (SENOKOT) 8.6 MG TABS tablet Place 1 tablet (8.6 mg total) into feeding tube daily. 120 tablet 0   Water For Irrigation, Sterile (FREE WATER) SOLN Place 150 mLs into feeding tube every 6 (six) hours.     [DISCONTINUED] hydrALAZINE (APRESOLINE) 50 MG tablet Place 1 tablet (50 mg total) into feeding tube every 8 (eight) hours. 90 tablet 6   QUEtiapine (SEROQUEL) 50 MG tablet Place 1 tablet (50 mg total) into feeding tube 2 (two) times daily. 60 tablet 0   No current facility-administered medications on file prior to visit.    No Known Allergies  Social History   Substance and Sexual Activity  Alcohol Use Not  Currently    Social History   Tobacco Use  Smoking Status Former  Smokeless Tobacco Never    Review of Systems  Constitutional: Negative.   HENT: Negative.    Eyes: Negative.   Respiratory: Negative.    Cardiovascular: Negative.   Gastrointestinal: Negative.   Genitourinary: Negative.   Musculoskeletal: Negative.   Skin: Negative.   Neurological: Negative.   Endo/Heme/Allergies: Negative.   Psychiatric/Behavioral: Negative.      Objective   Vitals:   06/08/22 1256  BP: 138/81  Pulse: 76  Resp: 16  Temp: (!) 97.4 F (36.3 C)  SpO2: 96%    Physical Exam Vitals reviewed. Exam conducted with a chaperone present.  Constitutional:      Appearance: Normal appearance. She is normal weight. She is not ill-appearing.     Comments: Ambulates with a walker.  HENT:  Head: Normocephalic and atraumatic.  Neck:     Comments: Tracheostomy in place. Cardiovascular:     Rate and Rhythm: Normal rate and regular rhythm.     Heart sounds: Normal heart sounds. No murmur heard.    No friction rub. No gallop.  Pulmonary:     Effort: Pulmonary effort is normal. No respiratory distress.     Breath sounds: No stridor. Wheezing present. No rhonchi or rales.     Comments: Bilateral expiratory wheezing noted. Skin:    General: Skin is warm and dry.  Neurological:     Mental Status: She is alert and oriented to person, place, and time.   Breast: Large central to the lateral left breast mass measuring greater than 6 cm in size.  Minimal dimpling is noted along the lateral areola.  No ulcerations present.  Matted lymph nodes noted in the left axilla.  Recent PET scan shows progressive hypermetabolic activity in the left axilla and subpectoral regions.  A new hypermetabolic left supraclavicular lymph node noted.  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 Plan  I had an extensive discussion with the patient.  I feel she is at high risk for any surgical intervention as a left mastectomy would require general anesthesia.  Given her extensive lymphadenopathy, a left simple mastectomy may be all that I can offer to help control her disease.  I think the risks of surgical intervention outweigh the benefits.  I did explain this to the patient and her daughter.  I will also discuss this with Dr. Delton Coombes.  Patient fully understands.

## 2022-06-10 ENCOUNTER — Encounter: Payer: Self-pay | Admitting: Licensed Clinical Social Worker

## 2022-06-10 ENCOUNTER — Inpatient Hospital Stay: Payer: Medicaid Other

## 2022-06-10 ENCOUNTER — Inpatient Hospital Stay: Payer: Medicaid Other | Admitting: Licensed Clinical Social Worker

## 2022-06-10 VITALS — BP 113/75 | HR 86 | Temp 97.2°F | Resp 18 | Wt 161.4 lb

## 2022-06-10 DIAGNOSIS — Z803 Family history of malignant neoplasm of breast: Secondary | ICD-10-CM

## 2022-06-10 DIAGNOSIS — C50912 Malignant neoplasm of unspecified site of left female breast: Secondary | ICD-10-CM

## 2022-06-10 DIAGNOSIS — Z8041 Family history of malignant neoplasm of ovary: Secondary | ICD-10-CM

## 2022-06-10 DIAGNOSIS — Z5111 Encounter for antineoplastic chemotherapy: Secondary | ICD-10-CM | POA: Diagnosis not present

## 2022-06-10 LAB — CBC WITH DIFFERENTIAL/PLATELET
Abs Immature Granulocytes: 0.01 10*3/uL (ref 0.00–0.07)
Basophils Absolute: 0 10*3/uL (ref 0.0–0.1)
Basophils Relative: 0 %
Eosinophils Absolute: 0.1 10*3/uL (ref 0.0–0.5)
Eosinophils Relative: 2 %
HCT: 40.8 % (ref 36.0–46.0)
Hemoglobin: 12.7 g/dL (ref 12.0–15.0)
Immature Granulocytes: 0 %
Lymphocytes Relative: 31 %
Lymphs Abs: 1.5 10*3/uL (ref 0.7–4.0)
MCH: 26.1 pg (ref 26.0–34.0)
MCHC: 31.1 g/dL (ref 30.0–36.0)
MCV: 83.8 fL (ref 80.0–100.0)
Monocytes Absolute: 0.4 10*3/uL (ref 0.1–1.0)
Monocytes Relative: 9 %
Neutro Abs: 2.7 10*3/uL (ref 1.7–7.7)
Neutrophils Relative %: 58 %
Platelets: 144 10*3/uL — ABNORMAL LOW (ref 150–400)
RBC: 4.87 MIL/uL (ref 3.87–5.11)
RDW: 14.8 % (ref 11.5–15.5)
WBC: 4.8 10*3/uL (ref 4.0–10.5)
nRBC: 0 % (ref 0.0–0.2)

## 2022-06-10 LAB — COMPREHENSIVE METABOLIC PANEL
ALT: 23 U/L (ref 0–44)
AST: 22 U/L (ref 15–41)
Albumin: 4 g/dL (ref 3.5–5.0)
Alkaline Phosphatase: 72 U/L (ref 38–126)
Anion gap: 9 (ref 5–15)
BUN: 29 mg/dL — ABNORMAL HIGH (ref 8–23)
CO2: 29 mmol/L (ref 22–32)
Calcium: 9.4 mg/dL (ref 8.9–10.3)
Chloride: 102 mmol/L (ref 98–111)
Creatinine, Ser: 0.68 mg/dL (ref 0.44–1.00)
GFR, Estimated: >60 mL/min (ref >60)
Glucose, Bld: 94 mg/dL (ref 70–99)
Potassium: 4 mmol/L (ref 3.5–5.1)
Sodium: 140 mmol/L (ref 135–145)
Total Bilirubin: 0.3 mg/dL (ref 0.3–1.2)
Total Protein: 7.8 g/dL (ref 6.5–8.1)

## 2022-06-10 LAB — GENETIC SCREENING ORDER

## 2022-06-10 MED ORDER — FULVESTRANT 250 MG/5ML IM SOSY
500.0000 mg | PREFILLED_SYRINGE | Freq: Once | INTRAMUSCULAR | Status: AC
  Administered 2022-06-10: 500 mg via INTRAMUSCULAR
  Filled 2022-06-10: qty 10

## 2022-06-10 NOTE — Progress Notes (Signed)
Patient tolerated Faslodex injection with no complaints voiced. Bilateral sites clean and dry with no bruising or swelling noted. Band aids applied. See MAR for details. Patient stable during and after injections. VSS with discharge and left in satisfactory condition with no s/s of distress noted.   

## 2022-06-10 NOTE — Patient Instructions (Signed)
[facility]  Discharge Instructions: Thank you for choosing [facility] to provide your oncology and hematology care.  If you have a lab appointment with the [facility], please come in thru the Main Entrance and check in at the main information desk.  Wear comfortable clothing and clothing appropriate for easy access to any Portacath or PICC line.   We strive to give you quality time with your provider. You may need to reschedule your appointment if you arrive late (15 or more minutes).  Arriving late affects you and other patients whose appointments are after yours.  Also, if you miss three or more appointments without notifying the office, you may be dismissed from the clinic at the provider's discretion.      For prescription refill requests, have your pharmacy contact our office and allow 72 hours for refills to be completed.    Today you received the following Faslodex, return as scheduled.    To help prevent nausea and vomiting after your treatment, we encourage you to take your nausea medication as directed.  BELOW ARE SYMPTOMS THAT SHOULD BE REPORTED IMMEDIATELY: *FEVER GREATER THAN 100.4 F (38 C) OR HIGHER *CHILLS OR SWEATING *NAUSEA AND VOMITING THAT IS NOT CONTROLLED WITH YOUR NAUSEA MEDICATION *UNUSUAL SHORTNESS OF BREATH *UNUSUAL BRUISING OR BLEEDING *URINARY PROBLEMS (pain or burning when urinating, or frequent urination) *BOWEL PROBLEMS (unusual diarrhea, constipation, pain near the anus) TENDERNESS IN MOUTH AND THROAT WITH OR WITHOUT PRESENCE OF ULCERS (sore throat, sores in mouth, or a toothache) UNUSUAL RASH, SWELLING OR PAIN  UNUSUAL VAGINAL DISCHARGE OR ITCHING   Items with * indicate a potential emergency and should be followed up as soon as possible or go to the Emergency Department if any problems should occur.  Please show the CHEMOTHERAPY ALERT CARD or IMMUNOTHERAPY ALERT CARD at check-in to the Emergency Department and  triage nurse.  Should you have questions after your visit or need to cancel or reschedule your appointment, please contact [facility] [phone]  and follow the prompts.  Office hours are 8:00 a.m. to 4:30 p.m. Monday - Friday. Please note that voicemails left after 4:00 p.m. may not be returned until the following business day.  We are closed weekends and major holidays. You have access to a nurse at all times for urgent questions. Please call the main number to the clinic [phone] and follow the prompts.  For any non-urgent questions, you may also contact your provider using MyChart. We now offer e-Visits for anyone 67 and older to request care online for non-urgent symptoms. For details visit [email or web address].   Also download the MyChart app! Go to the app store, search "MyChart", open the app, select [facility], and log in with your MyChart username and password.

## 2022-06-10 NOTE — Progress Notes (Signed)
REFERRING PROVIDER: Derek Jack, MD [place],  [place] [id number]  PRIMARY PROVIDER:  Hal Morales, DO  PRIMARY REASON FOR VISIT:  1. Malignant neoplasm of left female breast, unspecified estrogen receptor status, unspecified site of breast ([place])   2. Family history of breast cancer   3. Family history of ovarian cancer   4. Family history of lung cancer    I connected with Stacy Moran on 06/10/2022 at 10:50 AM EDT by MyChart video conference and verified that I am speaking with the correct person using two identifiers.    Patient location: home Provider location: Stacy Moran:   Stacy Moran, a 67 y.o. female, was seen for a [facility] cancer genetics consultation at the request of Dr. Delton Moran due to a personal and family history of breast cancer.  Stacy Moran presents to clinic today to discuss the possibility of a hereditary predisposition to cancer, genetic testing, and to further clarify her future cancer risks, as well as potential cancer risks for family members.   In 2023, at the age of 75, Stacy Moran was diagnosed with invasive ductal carcinoma of the left breast.   CANCER HISTORY:  Oncology History  Breast cancer, left breast ([place])  06/28/2021 Initial Diagnosis   Breast cancer, left breast ([facility])   06/15/2022 -  Chemotherapy   Patient is on Treatment Plan : BREAST [place] q28d      Past Medical History:  Diagnosis Date   Acute respiratory failure ([place])    Hypertension    Prosthetic eye globe    Ruptured aneurysm of artery (HCC)    Seizures ([place])    Status post insertion of percutaneous endoscopic gastrostomy (PEG) tube ([place])    Stroke ([place])    Subdural hematoma ([place])    Tracheostomy dependent ([place])    Tracheostomy present [facility])     Past Surgical History:  Procedure Laterality Date   BURR HOLE Stacy Moran 03/18/2020   Procedure: Haskell Flirt;  Surgeon: Stacy Lose, MD;  Location: [place];  Service:  Neurosurgery;  Laterality: Stacy Moran;   CRANIOTOMY Left 01/30/2020   Procedure: LEFT FAR LATERAL CRANIOTOMY FOR ANEURYSM CLIPPING;  Surgeon: Stacy Lose, MD;  Location: [place];  Service: Neurosurgery;  Laterality: Left;   DIRECT LARYNGOSCOPY Stacy Moran 02/29/2020   Procedure: DIRECT LARYNGOSCOPY;  Surgeon: Stacy Gala, MD;  Location: [facility];  Service: ENT;  Laterality: Stacy Moran;   DIRECT LARYNGOSCOPY Stacy Moran 10/03/2020   Procedure: DIRECT LARYNGOSCOPY w/esophagascopy;  Surgeon: Stacy Gala, MD;  Location: [facility];  Service: ENT;  Laterality: Stacy Moran;   INTUBATION-ENDOTRACHEAL WITH TRACHEOSTOMY STANDBY Stacy Moran 12/14/2020   Procedure: Stacy Moran WITH TRACHEOSTOMY STANDBY;  Surgeon: Stacy Manner, MD;  Location: [facility];  Service: ENT;  Laterality: Stacy Moran;   IR ANGIO INTRA EXTRACRAN SEL INTERNAL CAROTID BILAT MOD SED  01/30/2020   IR ANGIO VERTEBRAL SEL VERTEBRAL UNI L MOD SED  01/30/2020   IR CM INJ ANY COLONIC TUBE W/FLUORO  07/03/2020   IR GASTROSTOMY TUBE MOD SED  02/22/2020   IR PATIENT EVAL TECH 0-60 MINS  02/11/2022   IR [place] GASTRO/COLONIC TUBE PERCUT W/FLUORO  07/03/2020   LAPAROSCOPIC REVISION VENTRICULAR-PERITONEAL (V-P) SHUNT Stacy Moran 03/18/2020   Procedure: LAPAROSCOPIC REVISION VENTRICULAR-PERITONEAL (V-P) SHUNT;  Surgeon: Stacy Bolt, MD;  Location: [facility];  Service: General;  Laterality: Stacy Moran;   RADIOLOGY WITH ANESTHESIA Stacy Moran 01/30/2020   Procedure: IR WITH ANESTHESIA;  Surgeon: Stacy Lose, MD;  Location: [facility];  Service: Radiology;  Laterality: Stacy Moran;  THYROIDECTOMY Stacy Moran 02/08/2020   Procedure: THYROIDECTOMY;  Surgeon: Stacy Gala, MD;  Location: [facility];  Service: ENT;  Laterality: Stacy Moran;   TRACHEOSTOMY TUBE PLACEMENT Stacy Moran 02/08/2020   Procedure: TRACHEOSTOMY;  Surgeon: Stacy Gala, MD;  Location: [facility];  Service: ENT;  Laterality: Stacy Moran;   TRACHEOSTOMY TUBE PLACEMENT Stacy Moran 02/29/2020   Procedure: TRACHEOSTOMY EXCHANGE;  Surgeon: Stacy Gala, MD;  Location: [facility];  Service: ENT;  Laterality: Stacy Moran;    VENTRICULOPERITONEAL SHUNT Stacy Moran 03/18/2020   Procedure: RIGHT SHUNT INSERTION VENTRICULAR-PERITONEAL/ BURR HOLE Evacuation of Subdural Hematoma;  Surgeon: Stacy Lose, MD;  Location: [facility];  Service: Neurosurgery;  Laterality: Stacy Moran;    FAMILY HISTORY:  We obtained a detailed, 4-generation family history.  Significant diagnoses are listed below: Family History  Problem Relation Age of Onset   Breast cancer Sister    Ovarian cancer Sister        dx 30s,d. 35s   Brain cancer Moran        dx 21s   Stacy Moran, she had brain cancer in her 43s and is living at 45. Patient has 3 sisters and 2 brothers. One sister had ovarian cancer at 73 and passed from it. Another sister has had breast cancer. Patient has limited family history information.   No other known cancers in the family.   Stacy Moran is unaware of previous family history of genetic testing for hereditary cancer risks. There is no reported Ashkenazi Jewish ancestry. There is no known consanguinity.   GENETIC COUNSELING ASSESSMENT: Stacy Moran is a 67 y.o. female with a personal and family history of breast and ovarian cancer which is somewhat suggestive of a hereditary cancer syndrome and predisposition to cancer. We, therefore, discussed and recommended the following at today's visit.   DISCUSSION: We discussed that approximately 10% of breast cancer is hereditary, up to 20% of ovarian cancer is hereditary. Most cases of hereditary breast/ovarian cancer are associated with BRCA1/BRCA2 genes, although there are other genes associated with hereditary cancer as well. Cancers and risks are gene specific. We discussed that testing is beneficial for several reasons including knowing about cancer risks, identifying potential screening and risk-reduction options that may be appropriate, and to understand if other family members could be at risk for cancer and allow them to undergo genetic testing.   We reviewed the  characteristics, features and inheritance patterns of hereditary cancer syndromes. We also discussed genetic testing, including the appropriate family members to test, the process of testing, insurance coverage and turn-around-time for results. We discussed the implications of a negative, positive and/or variant of uncertain significant result. We recommended Stacy Moran pursue genetic testing for the [facility] Multi-Cancer+RNA gene panel.   Based on Stacy Moran's personal and family history of cancer, she meets medical criteria for genetic testing. Despite that she meets criteria, she may still have an out of pocket cost. We discussed that if her out of pocket cost for testing is over $100, the laboratory will call and confirm whether she wants to proceed with testing.  If the out of pocket cost of testing is less than $100 she will be billed by the genetic testing laboratory.   PLAN: After considering the risks, benefits, and limitations, Stacy Moran provided informed consent to pursue genetic testing and the blood sample was sent to [facility] for analysis of the Multi-Cancer+RNA panel. Results should be available within approximately 2-3 weeks' time, at which point they will be disclosed by telephone to [name], as  will any additional recommendations warranted by these results. Stacy Moran will receive a summary of her genetic counseling visit and a copy of her results once available. This information will also be available in Epic.   Stacy Moran's questions were answered to her satisfaction today. Our contact information was provided should additional questions or concerns arise. Thank you for the referral and allowing [place] to share in the care of your patient.   Faith Rogue, MS, [facility] Genetic Counselor [email or web address] Phone: [phone]  The patient was seen for a total of 12 minutes in virtual genetic counseling. Patient requests we call [place] with results.   Dr.  Grayland Ormond was available for discussion regarding this case.   _______________________________________________________________________ For Office Staff:  Number of people involved in session: 2 Was an Intern/ student involved with case: no

## 2022-06-21 ENCOUNTER — Ambulatory Visit: Payer: Self-pay | Admitting: Licensed Clinical Social Worker

## 2022-06-21 ENCOUNTER — Telehealth: Payer: Self-pay | Admitting: Licensed Clinical Social Worker

## 2022-06-21 ENCOUNTER — Encounter: Payer: Self-pay | Admitting: Licensed Clinical Social Worker

## 2022-06-21 DIAGNOSIS — Z1379 Encounter for other screening for genetic and chromosomal anomalies: Secondary | ICD-10-CM

## 2022-06-21 NOTE — Telephone Encounter (Signed)
I contacted Ms. Kingry to discuss her genetic testing results. No pathogenic variants were identified in the 70 genes analyzed. Detailed clinic note to follow.   The test report has been scanned into EPIC and is located under the Molecular Pathology section of the Results Review tab.  A portion of the result report is included below for reference.     Faith Rogue, MS, [facility] Genetic Counselor [email or web address] Phone: [phone]

## 2022-06-21 NOTE — Progress Notes (Signed)
HPI:   Ms. Dimare was previously seen in the [place] clinic due to a personal and family history of cancer and concerns regarding a hereditary predisposition to cancer. Please refer to our prior cancer genetics clinic note for more information regarding our discussion, assessment and recommendations, at the time. Ms. Phetteplace's recent genetic test results were disclosed to her, as were recommendations warranted by these results. These results and recommendations are discussed in more detail below.  CANCER HISTORY:  Oncology History  Breast cancer, left breast ([place])  06/28/2021 Initial Diagnosis   Breast cancer, left breast ([place])   06/10/2022 -  Chemotherapy   Patient is on Treatment Plan : BREAST Fulvestrant q28d       FAMILY HISTORY:  We obtained a detailed, 4-generation family history.  Significant diagnoses are listed below: Family History  Problem Relation Age of Onset   Breast cancer Sister    Ovarian cancer Sister        dx 30s,d. 86s   Brain cancer Daughter        dx 31s   Ms. Poeschel has 1 daughter, she had brain cancer in her 75s and is living at 91. Patient has 3 sisters and 2 brothers. One sister had ovarian cancer at 48 and passed from it. Another sister has had breast cancer. Patient has limited family history information.    No other known cancers in the family.    Ms. Schafer is unaware of previous family history of genetic testing for hereditary cancer risks. There is no reported Ashkenazi Jewish ancestry. There is no known consanguinity.   GENETIC TEST RESULTS:  The [facility] Multi-Cancer+RNA Panel found no pathogenic mutations.   The Multi-Cancer + RNA Panel offered by [facility] includes sequencing and/or deletion/duplication analysis of the following 70 genes:  AIP*, ALK, APC*, ATM*, AXIN2*, BAP1*, BARD1*, BLM*, BMPR1A*, BRCA1*, BRCA2*, BRIP1*, CDC73*, CDH1*, CDK4, CDKN1B*, CDKN2A, CHEK2*, CTNNA1*, DICER1*, EPCAM, EGFR, FH*, FLCN*, GREM1, HOXB13, KIT,  LZTR1, MAX*, MBD4, MEN1*, MET, MITF, MLH1*, MSH2*, MSH3*, MSH6*, MUTYH*, NF1*, NF2*, NTHL1*, PALB2*, PDGFRA, PMS2*, POLD1*, POLE*, POT1*, PRKAR1A*, PTCH1*, PTEN*, RAD51C*, RAD51D*, RB1*, RET, SDHA*, SDHAF2*, SDHB*, SDHC*, SDHD*, SMAD4*, SMARCA4*, SMARCB1*, SMARCE1*, STK11*, SUFU*, TMEM127*, TP53*, TSC1*, TSC2*, VHL*. RNA analysis is performed for * genes.  The test report has been scanned into EPIC and is located under the Molecular Pathology section of the Results Review tab.  A portion of the result report is included below for reference. Genetic testing reported out on 06/18/2022    Even though a pathogenic variant was not identified, possible explanations for the cancer in the family may include: There may be no hereditary risk for cancer in the family. The cancers in Ms. Nunnally and/or her family may be sporadic/familial or due to other genetic and environmental factors. There may be a gene mutation in one of these genes that current testing methods cannot detect but that chance is small. There could be another gene that has not yet been discovered, or that we have not yet tested, that is responsible for the cancer diagnoses in the family.  It is also possible there is a hereditary cause for the cancer in the family that Ms. Beagles did not inherit.  Therefore, it is important to remain in touch with cancer genetics in the future so that we can continue to offer Ms. Warmath the most up to date genetic testing.   ADDITIONAL GENETIC TESTING:  We discussed with Ms. Diluzio that her genetic testing was fairly extensive.  If there are additional relevant genes identified to increase cancer risk that can be analyzed in the future, we would be happy to discuss and coordinate this testing at that time.    CANCER SCREENING RECOMMENDATIONS:  Ms. Font's test result is considered negative (normal).  This means that we have not identified a hereditary cause for her personal and family history of cancer  at this time.   An individual's cancer risk and medical management are not determined by genetic test results alone. Overall cancer risk assessment incorporates additional factors, including personal medical history, family history, and any available genetic information that may result in a personalized plan for cancer prevention and surveillance. Therefore, it is recommended she continue to follow the cancer management and screening guidelines provided by her oncology and primary healthcare provider.  RECOMMENDATIONS FOR FAMILY MEMBERS:   Since she did not inherit a identifiable mutation in a cancer predisposition gene included on this panel, her children could not have inherited a known mutation from her in one of these genes. Individuals in this family might be at some increased risk of developing cancer, over the general population risk, due to the family history of cancer.  Individuals in the family should notify their providers of the family history of cancer. We recommend women in this family have a yearly mammogram beginning at age 59, or 64 years younger than the earliest onset of cancer, an annual clinical breast exam, and perform monthly breast self-exams.  Family members should have colonoscopies by at age 8, or earlier, as recommended by their providers. Other members of the family may still carry a pathogenic variant in one of these genes that Ms. Kronenwetter did not inherit. Based on the family history, we recommend those related to her sister who had ovarian cancer (such as her sister's children) have genetic counseling and testing. Ms. Ferrand will let us know if we can be of any assistance in coordinating genetic counseling and/or testing for this family member.     FOLLOW-UP:  Lastly, we discussed with Ms. Blauser that cancer genetics is a rapidly advancing field and it is possible that new genetic tests will be appropriate for her and/or her family members in the future. We encouraged her  to remain in contact with cancer genetics on an annual basis so we can update her personal and family histories and let her know of advances in cancer genetics that may benefit this family.   Our contact number was provided. Ms. Kleckner's questions were answered to her satisfaction, and she knows she is welcome to call us at anytime with additional questions or concerns.    Faith Rogue, MS, [facility] Counsel[facility] [email or web address] Phone: [phone]

## 2022-06-24 ENCOUNTER — Inpatient Hospital Stay: Payer: Medicaid Other

## 2022-06-24 VITALS — BP 152/97 | HR 102 | Temp 97.5°F | Resp 18

## 2022-06-24 DIAGNOSIS — C50912 Malignant neoplasm of unspecified site of left female breast: Secondary | ICD-10-CM

## 2022-06-24 DIAGNOSIS — Z5111 Encounter for antineoplastic chemotherapy: Secondary | ICD-10-CM | POA: Diagnosis not present

## 2022-06-24 MED ORDER — FULVESTRANT 250 MG/5ML IM SOSY
500.0000 mg | PREFILLED_SYRINGE | Freq: Once | INTRAMUSCULAR | Status: AC
  Administered 2022-06-24: 500 mg via INTRAMUSCULAR
  Filled 2022-06-24: qty 10

## 2022-06-24 NOTE — Patient Instructions (Signed)
[facility]  Discharge Instructions: Thank you for choosing [facility] to provide your oncology and hematology care.  If you have a lab appointment with the Cancer Center, please come in thru the Main Entrance and check in at the main information desk.  Wear comfortable clothing and clothing appropriate for easy access to any Portacath or PICC line.   We strive to give you quality time with your provider. You may need to reschedule your appointment if you arrive late (15 or more minutes).  Arriving late affects you and other patients whose appointments are after yours.  Also, if you miss three or more appointments without notifying the office, you may be dismissed from the clinic at the provider's discretion.      For prescription refill requests, have your pharmacy contact our office and allow 72 hours for refills to be completed.    Today you received the following Faslodex, return as scheduled.    To help prevent nausea and vomiting after your treatment, we encourage you to take your nausea medication as directed.  BELOW ARE SYMPTOMS THAT SHOULD BE REPORTED IMMEDIATELY: *FEVER GREATER THAN 100.4 F (38 C) OR HIGHER *CHILLS OR SWEATING *NAUSEA AND VOMITING THAT IS NOT CONTROLLED WITH YOUR NAUSEA MEDICATION *UNUSUAL SHORTNESS OF BREATH *UNUSUAL BRUISING OR BLEEDING *URINARY PROBLEMS (pain or burning when urinating, or frequent urination) *BOWEL PROBLEMS (unusual diarrhea, constipation, pain near the anus) TENDERNESS IN MOUTH AND THROAT WITH OR WITHOUT PRESENCE OF ULCERS (sore throat, sores in mouth, or a toothache) UNUSUAL RASH, SWELLING OR PAIN  UNUSUAL VAGINAL DISCHARGE OR ITCHING   Items with * indicate a potential emergency and should be followed up as soon as possible or go to the Emergency Department if any problems should occur.  Please show the CHEMOTHERAPY ALERT CARD or IMMUNOTHERAPY ALERT CARD at check-in to the Emergency Department and  triage nurse.  Should you have questions after your visit or need to cancel or reschedule your appointment, please contact [facility] [phone]  and follow the prompts.  Office hours are 8:00 a.m. to 4:30 p.m. Monday - Friday. Please note that voicemails left after 4:00 p.m. may not be returned until the following business day.  We are closed weekends and major holidays. You have access to a nurse at all times for urgent questions. Please call the main number to the clinic [phone] and follow the prompts.  For any non-urgent questions, you may also contact your provider using MyChart. We now offer e-Visits for anyone 18 and older to request care online for non-urgent symptoms. For details visit [email or web address].   Also download the MyChart app! Go to the app store, search "MyChart", open the app, select [place], and log in with your MyChart username and password.   

## 2022-06-24 NOTE — Progress Notes (Signed)
Patient tolerated Faslodex injection with no complaints voiced.  Bilateral sites clean and dry with no bruising or swelling noted.  Band aids applied. See MAR for details.  Patient stable during and after injections.  VSS with discharge and left in satisfactory condition with no s/s of distress noted.    °

## 2022-06-29 ENCOUNTER — Other Ambulatory Visit: Payer: Self-pay

## 2022-07-02 ENCOUNTER — Other Ambulatory Visit: Payer: Self-pay

## 2022-07-03 ENCOUNTER — Other Ambulatory Visit: Payer: Self-pay

## 2022-07-08 ENCOUNTER — Inpatient Hospital Stay: Payer: Medicaid Other | Attending: Hematology

## 2022-07-08 ENCOUNTER — Telehealth: Payer: Medicaid Other | Admitting: Licensed Clinical Social Worker

## 2022-07-08 VITALS — BP 131/82 | HR 92 | Temp 97.5°F | Resp 18

## 2022-07-08 DIAGNOSIS — Z170 Estrogen receptor positive status [ER+]: Secondary | ICD-10-CM | POA: Insufficient documentation

## 2022-07-08 DIAGNOSIS — C50412 Malignant neoplasm of upper-outer quadrant of left female breast: Secondary | ICD-10-CM | POA: Insufficient documentation

## 2022-07-08 DIAGNOSIS — Z79818 Long term (current) use of other agents affecting estrogen receptors and estrogen levels: Secondary | ICD-10-CM | POA: Insufficient documentation

## 2022-07-08 DIAGNOSIS — C50912 Malignant neoplasm of unspecified site of left female breast: Secondary | ICD-10-CM

## 2022-07-08 MED ORDER — FULVESTRANT 250 MG/5ML IM SOSY
500.0000 mg | PREFILLED_SYRINGE | Freq: Once | INTRAMUSCULAR | Status: AC
  Administered 2022-07-08: 500 mg via INTRAMUSCULAR
  Filled 2022-07-08: qty 10

## 2022-07-08 NOTE — Progress Notes (Signed)
Patient tolerated Faslodex injection with no complaints voiced.  Site clean and dry with no bruising or swelling noted.  No complaints of pain.  Discharged with vital signs stable and no signs or symptoms of distress noted.  

## 2022-07-08 NOTE — Patient Instructions (Signed)
[facility]  Discharge Instructions: Thank you for choosing [facility] to provide your oncology and hematology care.  If you have a lab appointment with the Cancer Center - please note that after April 8th, 2024, all labs will be drawn in the cancer center.  You do not have to check in or register with the main entrance as you have in the past but will complete your check-in in the cancer center.  Wear comfortable clothing and clothing appropriate for easy access to any Portacath or PICC line.   We strive to give you quality time with your provider. You may need to reschedule your appointment if you arrive late (15 or more minutes).  Arriving late affects you and other patients whose appointments are after yours.  Also, if you miss three or more appointments without notifying the office, you may be dismissed from the clinic at the provider's discretion.      For prescription refill requests, have your pharmacy contact our office and allow 72 hours for refills to be completed.    Today you received the following Faslodex injection.  Fulvestrant Injection What is this medication? FULVESTRANT (ful VES trant) treats breast cancer. It works by blocking the hormone estrogen in breast tissue, which prevents breast cancer cells from spreading or growing. This medicine may be used for other purposes; ask your health care provider or pharmacist if you have questions. COMMON BRAND NAME(S): FASLODEX What should I tell my care team before I take this medication? They need to know if you have any of these conditions: Bleeding disorder Liver disease Low blood cell levels, such as low white cells, red cells, and platelets An unusual or allergic reaction to fulvestrant, other medications, foods, dyes, or preservatives Pregnant or trying to get pregnant Breast-feeding How should I use this medication? This medication is injected into a muscle. It is given by your care  team in a hospital or clinic setting. Talk to your care team about the use of this medication in children. Special care may be needed. Overdosage: If you think you have taken too much of this medicine contact a poison control center or emergency room at once. NOTE: This medicine is only for you. Do not share this medicine with others. What if I miss a dose? Keep appointments for follow-up doses. It is important not to miss your dose. Call your care team if you are unable to keep an appointment. What may interact with this medication? Certain medications that prevent or treat blood clots, such as warfarin, enoxaparin, dalteparin, apixaban, dabigatran, rivaroxaban This list may not describe all possible interactions. Give your health care provider a list of all the medicines, herbs, non-prescription drugs, or dietary supplements you use. Also tell them if you smoke, drink alcohol, or use illegal drugs. Some items may interact with your medicine. What should I watch for while using this medication? Your condition will be monitored carefully while you are receiving this medication. You may need blood work while taking this medication. Talk to your care team if you may be pregnant. Serious birth defects can occur if you take this medication during pregnancy and for 1 year after the last dose. You will need a negative pregnancy test before starting this medication. Contraception is recommended while taking this medication and for 1 year after the last dose. Your care team can help you find the option that works for you. Do not breastfeed while taking this medication and for 1 year  after the last dose. This medication may cause infertility. Talk to your care team if you are concerned about your fertility. What side effects may I notice from receiving this medication? Side effects that you should report to your care team as soon as possible: Allergic reactions or angioedema--skin rash, itching or hives,  swelling of the face, eyes, lips, tongue, arms, or legs, trouble swallowing or breathing Pain, tingling, or numbness in the hands or feet Side effects that usually do not require medical attention (report to your care team if they continue or are bothersome): Bone, joint, or muscle pain Constipation Headache Hot flashes Nausea Pain, redness, or irritation at injection site Unusual weakness or fatigue This list may not describe all possible side effects. Call your doctor for medical advice about side effects. You may report side effects to FDA at 1-800-FDA-1088. Where should I keep my medication? This medication is given in a hospital or clinic. It will not be stored at home. NOTE: This sheet is a summary. It may not cover all possible information. If you have questions about this medicine, talk to your doctor, pharmacist, or health care provider.  2023 Elsevier/Gold Standard (2007-05-06 00:00:00)    To help prevent nausea and vomiting after your treatment, we encourage you to take your nausea medication as directed.  BELOW ARE SYMPTOMS THAT SHOULD BE REPORTED IMMEDIATELY: *FEVER GREATER THAN 100.4 F (38 C) OR HIGHER *CHILLS OR SWEATING *NAUSEA AND VOMITING THAT IS NOT CONTROLLED WITH YOUR NAUSEA MEDICATION *UNUSUAL SHORTNESS OF BREATH *UNUSUAL BRUISING OR BLEEDING *URINARY PROBLEMS (pain or burning when urinating, or frequent urination) *BOWEL PROBLEMS (unusual diarrhea, constipation, pain near the anus) TENDERNESS IN MOUTH AND THROAT WITH OR WITHOUT PRESENCE OF ULCERS (sore throat, sores in mouth, or a toothache) UNUSUAL RASH, SWELLING OR PAIN  UNUSUAL VAGINAL DISCHARGE OR ITCHING   Items with * indicate a potential emergency and should be followed up as soon as possible or go to the Emergency Department if any problems should occur.  Please show the CHEMOTHERAPY ALERT CARD or IMMUNOTHERAPY ALERT CARD at check-in to the Emergency Department and triage nurse.  Should you have  questions after your visit or need to cancel or reschedule your appointment, please contact [facility] [phone]  and follow the prompts.  Office hours are 8:00 a.m. to 4:30 p.m. Monday - Friday. Please note that voicemails left after 4:00 p.m. may not be returned until the following business day.  We are closed weekends and major holidays. You have access to a nurse at all times for urgent questions. Please call the main number to the clinic [phone] and follow the prompts.  For any non-urgent questions, you may also contact your provider using MyChart. We now offer e-Visits for anyone 47 and older to request care online for non-urgent symptoms. For details visit [email or web address].   Also download the MyChart app! Go to the app store, search "MyChart", open the app, select [place], and log in with your MyChart username and password.

## 2022-07-13 NOTE — Telephone Encounter (Signed)
Please advise 

## 2022-08-05 ENCOUNTER — Inpatient Hospital Stay: Payer: Medicaid Other | Attending: Hematology

## 2022-08-05 VITALS — BP 129/83 | HR 84 | Temp 97.1°F | Resp 20 | Wt 164.7 lb

## 2022-08-05 DIAGNOSIS — C50412 Malignant neoplasm of upper-outer quadrant of left female breast: Secondary | ICD-10-CM | POA: Diagnosis present

## 2022-08-05 DIAGNOSIS — C50912 Malignant neoplasm of unspecified site of left female breast: Secondary | ICD-10-CM

## 2022-08-05 DIAGNOSIS — Z79818 Long term (current) use of other agents affecting estrogen receptors and estrogen levels: Secondary | ICD-10-CM | POA: Diagnosis not present

## 2022-08-05 MED ORDER — FULVESTRANT 250 MG/5ML IM SOSY
500.0000 mg | PREFILLED_SYRINGE | Freq: Once | INTRAMUSCULAR | Status: AC
  Administered 2022-08-05: 500 mg via INTRAMUSCULAR
  Filled 2022-08-05: qty 10

## 2022-08-05 NOTE — Progress Notes (Signed)
Patient presents today for Faslodex infusion per providers order.  Vital signs WNL.  Patient has no new complaints at this time.  Stable during administration without incident; injection sites WNL; see MAR for injection details.  Patient tolerated procedure well and without incident.  No questions or complaints noted at this time.

## 2022-08-05 NOTE — Patient Instructions (Signed)
[facility]  Discharge Instructions: Thank you for choosing [facility] to provide your oncology and hematology care.  If you have a lab appointment with the Cancer Center - please note that after April 8th, 2024, all labs will be drawn in the cancer center.  You do not have to check in or register with the main entrance as you have in the past but will complete your check-in in the cancer center.  Wear comfortable clothing and clothing appropriate for easy access to any Portacath or PICC line.   We strive to give you quality time with your provider. You may need to reschedule your appointment if you arrive late (15 or more minutes).  Arriving late affects you and other patients whose appointments are after yours.  Also, if you miss three or more appointments without notifying the office, you may be dismissed from the clinic at the provider's discretion.      For prescription refill requests, have your pharmacy contact our office and allow 72 hours for refills to be completed.    Today you received the following chemotherapy and/or immunotherapy agents Faslodex      To help prevent nausea and vomiting after your treatment, we encourage you to take your nausea medication as directed.  BELOW ARE SYMPTOMS THAT SHOULD BE REPORTED IMMEDIATELY: *FEVER GREATER THAN 100.4 F (38 C) OR HIGHER *CHILLS OR SWEATING *NAUSEA AND VOMITING THAT IS NOT CONTROLLED WITH YOUR NAUSEA MEDICATION *UNUSUAL SHORTNESS OF BREATH *UNUSUAL BRUISING OR BLEEDING *URINARY PROBLEMS (pain or burning when urinating, or frequent urination) *BOWEL PROBLEMS (unusual diarrhea, constipation, pain near the anus) TENDERNESS IN MOUTH AND THROAT WITH OR WITHOUT PRESENCE OF ULCERS (sore throat, sores in mouth, or a toothache) UNUSUAL RASH, SWELLING OR PAIN  UNUSUAL VAGINAL DISCHARGE OR ITCHING   Items with * indicate a potential emergency and should be followed up as soon as possible or go to the  Emergency Department if any problems should occur.  Please show the CHEMOTHERAPY ALERT CARD or IMMUNOTHERAPY ALERT CARD at check-in to the Emergency Department and triage nurse.  Should you have questions after your visit or need to cancel or reschedule your appointment, please contact [facility] [phone]  and follow the prompts.  Office hours are 8:00 a.m. to 4:30 p.m. Monday - Friday. Please note that voicemails left after 4:00 p.m. may not be returned until the following business day.  We are closed weekends and major holidays. You have access to a nurse at all times for urgent questions. Please call the main number to the clinic [phone] and follow the prompts.  For any non-urgent questions, you may also contact your provider using MyChart. We now offer e-Visits for anyone 46 and older to request care online for non-urgent symptoms. For details visit [email or web address].   Also download the MyChart app! Go to the app store, search "MyChart", open the app, select [place], and log in with your MyChart username and password.

## 2022-08-11 ENCOUNTER — Other Ambulatory Visit: Payer: Self-pay

## 2022-08-25 ENCOUNTER — Other Ambulatory Visit: Payer: Self-pay

## 2022-08-25 DIAGNOSIS — C50912 Malignant neoplasm of unspecified site of left female breast: Secondary | ICD-10-CM

## 2022-08-26 ENCOUNTER — Inpatient Hospital Stay: Payer: Medicaid Other

## 2022-08-26 ENCOUNTER — Encounter (HOSPITAL_COMMUNITY)
Admission: RE | Admit: 2022-08-26 | Discharge: 2022-08-26 | Disposition: A | Discharge status: Home / Self Care | Payer: Medicaid Other | Source: Ambulatory Visit | Attending: Hematology | Admitting: Hematology

## 2022-08-26 DIAGNOSIS — C50912 Malignant neoplasm of unspecified site of left female breast: Secondary | ICD-10-CM | POA: Diagnosis present

## 2022-08-26 LAB — CBC WITH DIFFERENTIAL/PLATELET
Abs Immature Granulocytes: 0.01 10*3/uL (ref 0.00–0.07)
Basophils Absolute: 0 10*3/uL (ref 0.0–0.1)
Basophils Relative: 0 %
Eosinophils Absolute: 0.1 10*3/uL (ref 0.0–0.5)
Eosinophils Relative: 1 %
HCT: 43.2 % (ref 36.0–46.0)
Hemoglobin: 13.5 g/dL (ref 12.0–15.0)
Immature Granulocytes: 0 %
Lymphocytes Relative: 37 %
Lymphs Abs: 1.8 10*3/uL (ref 0.7–4.0)
MCH: 25.6 pg — ABNORMAL LOW (ref 26.0–34.0)
MCHC: 31.3 g/dL (ref 30.0–36.0)
MCV: 82 fL (ref 80.0–100.0)
Monocytes Absolute: 0.4 10*3/uL (ref 0.1–1.0)
Monocytes Relative: 7 %
Neutro Abs: 2.7 10*3/uL (ref 1.7–7.7)
Neutrophils Relative %: 55 %
Platelets: 139 10*3/uL — ABNORMAL LOW (ref 150–400)
RBC: 5.27 MIL/uL — ABNORMAL HIGH (ref 3.87–5.11)
RDW: 14.4 % (ref 11.5–15.5)
WBC: 5 10*3/uL (ref 4.0–10.5)
nRBC: 0 % (ref 0.0–0.2)

## 2022-08-26 LAB — COMPREHENSIVE METABOLIC PANEL
ALT: 19 U/L (ref 0–44)
AST: 24 U/L (ref 15–41)
Albumin: 3.9 g/dL (ref 3.5–5.0)
Alkaline Phosphatase: 73 U/L (ref 38–126)
Anion gap: 9 (ref 5–15)
BUN: 21 mg/dL (ref 8–23)
CO2: 28 mmol/L (ref 22–32)
Calcium: 9.5 mg/dL (ref 8.9–10.3)
Chloride: 102 mmol/L (ref 98–111)
Creatinine, Ser: 0.7 mg/dL (ref 0.44–1.00)
GFR, Estimated: >60 mL/min (ref >60)
Glucose, Bld: 138 mg/dL — ABNORMAL HIGH (ref 70–99)
Potassium: 3.7 mmol/L (ref 3.5–5.1)
Sodium: 139 mmol/L (ref 135–145)
Total Bilirubin: 0.6 mg/dL (ref 0.3–1.2)
Total Protein: 8 g/dL (ref 6.5–8.1)

## 2022-08-26 MED ORDER — FLUDEOXYGLUCOSE F - 18 (FDG) INJECTION
8.6500 | Freq: Once | INTRAVENOUS | Status: AC | PRN
  Administered 2022-08-26: 8.65 via INTRAVENOUS

## 2022-08-28 LAB — CANCER ANTIGEN 15-3: CA 15-3: 21.6 U/mL (ref 0.0–25.0)

## 2022-08-31 LAB — CANCER ANTIGEN 27.29: CA 27.29: 32 U/mL (ref 0.0–38.6)

## 2022-09-01 NOTE — Progress Notes (Signed)
[facility] [address] [place] [address]    Clinic Day:  09/02/2022  Referring physician: Sherol Dade, *  Patient Care Team: Sherol Dade, DO (Inactive) as PCP - General (Family Medicine) Doreatha Massed, MD as Medical Oncologist (Medical Oncology)   ASSESSMENT & PLAN:   Assessment: 1.  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 - Left breast biopsy on 06/16/2021 - Pathology: Invasive ductal carcinoma, grade 2/3, ER 80% positive, weak to moderate staining, PR 30% positive, moderate staining, Ki-67-1%.  HER2 was 2+ by IHC and negative by FISH. - PET scan on 06/11/2021: Hypermetabolic left breast mass and enlarged hypermetabolic left axillary lymph nodes extending to the subpectoral nodal station.  Skin thickening in the left axilla.  No distant metastatic disease. - Anastrozole from 06/29/2021 through 06/08/2022 with progression after initial response - PET scan (06/03/2022): New left supraclavicular lymph node and progressive left axillary and subpectoral lymph node with persistent left breast mass. - Faslodex started on 06/10/2022  2.  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  3.  Social/family history: - She used to live with her daughter prior to recent placement at [facility] since June/July of 2022.  She used to work at [employer].  Quit smoking 2 years ago.  Smoked half pack per day for 45 years. - Sister had breast cancer.  Daughter had lung cancer.  Mother had lung cancer.  Another sister had ovarian cancer.    Plan: 1.  Stage IIIb (T2 N3 M0) left breast IDC, ER/PR positive and HER2 negative: - She has met with Dr. Lovell Sheehan.  Surgery was not recommended. - Faslodex was started on 06/10/2022. - She has some injection site pains otherwise  she is tolerating Faslodex very well. - Reviewed labs today which showed normal LFTs and creatinine.  CBC grossly normal.  CA 15-3 was 21.6 and CA 27-29 was 32. - PET scan (08/26/2022): Hypermetabolic left axillary, left retropectoral, left supraclavicular and left breast lesion overall decreased in metabolism since 06/03/2022.  No new or progressive hypermetabolic disease. - She does not have any headaches or mental status changes.  Will closely monitor.  If there is any changes will do brain imaging. - Continue Faslodex monthly.  RTC 2 months with repeat tumor markers.  2.  Bone health: - DEXA scan on 07/06/2021 with T-score -0.1. - Last vitamin D level is 43.  Continue calcium and vitamin D supplements.  3.  Hypermetabolism in the left central uterus: - PET scan showed incidental increased hypermetabolism throughout the left central uterus, nonspecific, cannot exclude endometrial hyperplasia or malignancy. - She reports occasional spotting. - Will order transvaginal pelvic ultrasound. - Will make referral for GYN consultation.    Orders Placed This Encounter  Procedures   US PELVIC COMPLETE WITH TRANSVAGINAL    Standing Status:   Future    Standing Expiration Date:   09/02/2023    Order Specific Question:   Reason for Exam (SYMPTOM  OR DIAGNOSIS REQUIRED)    Answer:   new uterine lesion    Order Specific Question:   Preferred imaging location?    Answer:   [facility]    Order Specific Question:   Release to patient    Answer:   Immediate   CBC  with Differential/Platelet    Standing Status:   Future    Standing Expiration Date:   09/02/2023    Order Specific Question:   Release to patient    Answer:   Immediate   Comprehensive metabolic panel    Standing Status:   Future    Standing Expiration Date:   09/02/2023    Order Specific Question:   Release to patient    Answer:   Immediate   Cancer antigen 15-3    Standing Status:   Future    Standing Expiration Date:   09/02/2023       I,Katie Daubenspeck,acting as a scribe for Doreatha Massed, MD.,have documented all relevant documentation on the behalf of Doreatha Massed, MD,as directed by  Doreatha Massed, MD while in the presence of Doreatha Massed, MD.   I, Doreatha Massed MD, have reviewed the above documentation for accuracy and completeness, and I agree with the above.   Doreatha Massed, MD   6/6/20245:15 PM  CHIEF COMPLAINT:   Diagnosis: left breast IDC, ER positive   Cancer Staging  Breast cancer, left breast (HCC) Staging form: Breast, AJCC 8th Edition - Clinical stage from 06/28/2021: Stage IIIB (cT2, cN3, cM0, G2, ER+, PR+, HER2-) - Unsigned    Prior Therapy: Anastrozole   Current Therapy:  Faslodex    HISTORY OF PRESENT ILLNESS:   Oncology History  Breast cancer, left breast (HCC)  06/28/2021 Initial Diagnosis   Breast cancer, left breast (HCC)   06/10/2022 -  Chemotherapy   Patient is on Treatment Plan : BREAST Fulvestrant q28d        INTERVAL HISTORY:   Stacy Moran is a 67 y.o. female presenting to clinic today for follow up of left breast IDC, ER positive. She was last seen by me on 06/08/22.  Since her last visit, she had genetic testing performed on 06/10/22. Results were negative.  She also underwent restaging PET scan on 08/26/22 showing: substantially increased hypermetabolism throughout left central uterus, nonspecific but endometrial hyperplasia or malignancy not excluded; decrease in metabolism of lymphadenopathy and outer left breast lesion since 05/2022 PET; no new or progressive hypermetabolic metastatic disease.  Today, she states that she is doing well overall. Her appetite level is at 100%. Her energy level is at 100%.  PAST MEDICAL HISTORY:   Past Medical History: Past Medical History:  Diagnosis Date   Acute respiratory failure (HCC)    Hypertension    Prosthetic eye globe    Ruptured aneurysm of artery (HCC)    Seizures (HCC)    Status post  insertion of percutaneous endoscopic gastrostomy (PEG) tube (HCC)    Stroke (HCC)    Subdural hematoma (HCC)    Tracheostomy dependent (HCC)    Tracheostomy present [facility])     Surgical History: Past Surgical History:  Procedure Laterality Date   BURR HOLE N/A 03/18/2020   Procedure: Ezekiel Ina;  Surgeon: Lisbeth Renshaw, MD;  Location: [facility] OR;  Service: Neurosurgery;  Laterality: N/A;   CRANIOTOMY Left 01/30/2020   Procedure: LEFT FAR LATERAL CRANIOTOMY FOR ANEURYSM CLIPPING;  Surgeon: Lisbeth Renshaw, MD;  Location: [facility] OR;  Service: Neurosurgery;  Laterality: Left;   DIRECT LARYNGOSCOPY N/A 02/29/2020   Procedure: DIRECT LARYNGOSCOPY;  Surgeon: Serena Colonel, MD;  Location: [facility] OR;  Service: ENT;  Laterality: N/A;   DIRECT LARYNGOSCOPY N/A 10/03/2020   Procedure: DIRECT LARYNGOSCOPY w/esophagascopy;  Surgeon: Serena Colonel, MD;  Location: [facility] OR;  Service: ENT;  Laterality: N/A;   INTUBATION-ENDOTRACHEAL WITH TRACHEOSTOMY STANDBY N/A 12/14/2020  Procedure: INTUBATION-ENDOTRACHEAL WITH TRACHEOSTOMY STANDBY;  Surgeon: Bud Face, MD;  Location: [facility] ORS;  Service: ENT;  Laterality: N/A;   IR ANGIO INTRA EXTRACRAN SEL INTERNAL CAROTID BILAT MOD SED  01/30/2020   IR ANGIO VERTEBRAL SEL VERTEBRAL UNI L MOD SED  01/30/2020   IR CM INJ ANY COLONIC TUBE W/FLUORO  07/03/2020   IR GASTROSTOMY TUBE MOD SED  02/22/2020   IR PATIENT EVAL TECH 0-60 MINS  02/11/2022   IR REPLC GASTRO/COLONIC TUBE PERCUT W/FLUORO  07/03/2020   LAPAROSCOPIC REVISION VENTRICULAR-PERITONEAL (V-P) SHUNT N/A 03/18/2020   Procedure: LAPAROSCOPIC REVISION VENTRICULAR-PERITONEAL (V-P) SHUNT;  Surgeon: Fritzi Mandes, MD;  Location: [facility] OR;  Service: General;  Laterality: N/A;   RADIOLOGY WITH ANESTHESIA N/A 01/30/2020   Procedure: IR WITH ANESTHESIA;  Surgeon: Lisbeth Renshaw, MD;  Location: [facility] OR;  Service: Radiology;  Laterality: N/A;   THYROIDECTOMY N/A 02/08/2020   Procedure: THYROIDECTOMY;  Surgeon: Serena Colonel, MD;   Location: [facility] OR;  Service: ENT;  Laterality: N/A;   TRACHEOSTOMY TUBE PLACEMENT N/A 02/08/2020   Procedure: TRACHEOSTOMY;  Surgeon: Serena Colonel, MD;  Location: [facility] OR;  Service: ENT;  Laterality: N/A;   TRACHEOSTOMY TUBE PLACEMENT N/A 02/29/2020   Procedure: TRACHEOSTOMY EXCHANGE;  Surgeon: Serena Colonel, MD;  Location: [facility] OR;  Service: ENT;  Laterality: N/A;   VENTRICULOPERITONEAL SHUNT N/A 03/18/2020   Procedure: RIGHT SHUNT INSERTION VENTRICULAR-PERITONEAL/ BURR HOLE Evacuation of Subdural Hematoma;  Surgeon: Lisbeth Renshaw, MD;  Location: [facility] OR;  Service: Neurosurgery;  Laterality: N/A;    Social History: Social History   Socioeconomic History   Marital status: Single    Spouse name: Not on file   Number of children: Not on file   Years of education: Not on file   Highest education level: Not on file  Occupational History   Not on file  Tobacco Use   Smoking status: Former   Smokeless tobacco: Never  Vaping Use   Vaping Use: Never used  Substance and Sexual Activity   Alcohol use: Not Currently   Drug use: Not Currently   Sexual activity: Not on file  Other Topics Concern   Not on file  Social History Narrative   Not on file   Social Determinants of Health   Financial Resource Strain: Not on file  Food Insecurity: Not on file  Transportation Needs: Not on file  Physical Activity: Not on file  Stress: Not on file  Social Connections: Not on file  Intimate Partner Violence: Not on file    Family History: Family History  Problem Relation Age of Onset   Breast cancer Sister    Ovarian cancer Sister        dx 30s,d. 30s   Brain cancer Daughter        dx 53s    Current Medications:  Current Outpatient Medications:    acetaminophen (TYLENOL) 325 MG tablet, Place 2 tablets (650 mg total) into feeding tube every 6 (six) hours as needed for mild pain., Disp: , Rfl:    ALPRAZolam (XANAX) 0.5 MG tablet, Take 0.5 mg by mouth 3 (three) times daily as needed., Disp:  , Rfl:    amLODipine (NORVASC) 5 MG tablet, Place 1 tablet (5 mg total) into feeding tube daily., Disp: , Rfl:    anastrozole (ARIMIDEX) 1 MG tablet, Take 1 tablet (1 mg total) by mouth daily., Disp: 30 tablet, Rfl: 6   chlorhexidine (PERIDEX) 0.12 % solution, 15 mLs by Mouth Rinse route 2 (two) times daily., Disp:  120 mL, Rfl: 0   DULoxetine (CYMBALTA) 20 MG capsule, Take 20 mg by mouth daily., Disp: , Rfl:    fluticasone (FLONASE) 50 MCG/ACT nasal spray, Place 1 spray into both nostrils 2 (two) times daily., Disp: , Rfl: 2   glycopyrrolate (ROBINUL) 1 MG tablet, Take 1 tablet (1 mg total) by mouth 2 (two) times daily. (Patient taking differently: Place 1 mg into feeding tube 2 (two) times daily.), Disp: , Rfl:    guaiFENesin (ROBITUSSIN) 100 MG/5ML liquid, Place 10 mLs into feeding tube every 8 (eight) hours., Disp: 120 mL, Rfl: 0   ipratropium-albuterol (DUONEB) 0.5-2.5 (3) MG/3ML SOLN, Take 3 mLs by nebulization every 4 (four) hours as needed., Disp: 360 mL, Rfl:    levETIRAcetam (KEPPRA) 100 MG/ML solution, Place 7.5 mLs (750 mg total) into feeding tube 2 (two) times daily., Disp: 473 mL, Rfl: 12   levothyroxine (SYNTHROID) 100 MCG tablet, Place 1 tablet (100 mcg total) into feeding tube daily at 6 (six) AM., Disp: 30 tablet, Rfl: 3   Nutritional Supplements (FEEDING SUPPLEMENT, OSMOLITE 1.2 CAL,) LIQD, Place 355 mLs into feeding tube 5 (five) times daily., Disp: , Rfl: 0   polyethylene glycol (MIRALAX / GLYCOLAX) 17 g packet, Place 17 g into feeding tube 2 (two) times daily., Disp: 14 each, Rfl: 0   QUEtiapine (SEROQUEL) 50 MG tablet, Place 1 tablet (50 mg total) into feeding tube 2 (two) times daily., Disp: 60 tablet, Rfl: 0   senna (SENOKOT) 8.6 MG TABS tablet, Place 1 tablet (8.6 mg total) into feeding tube daily., Disp: 120 tablet, Rfl: 0   Water For Irrigation, Sterile (FREE WATER) SOLN, Place 150 mLs into feeding tube every 6 (six) hours., Disp: , Rfl:    Allergies: No Known  Allergies  REVIEW OF SYSTEMS:   Review of Systems  Constitutional:  Negative for chills, fatigue and fever.  HENT:   Negative for lump/mass, mouth sores, nosebleeds, sore throat and trouble swallowing.   Eyes:  Negative for eye problems.  Respiratory:  Positive for cough. Negative for shortness of breath.   Cardiovascular:  Negative for chest pain, leg swelling and palpitations.  Gastrointestinal:  Positive for diarrhea. Negative for abdominal pain, constipation, nausea and vomiting.  Genitourinary:  Negative for bladder incontinence, difficulty urinating, dysuria, frequency, hematuria and nocturia.   Musculoskeletal:  Negative for arthralgias, back pain, flank pain, myalgias and neck pain.  Skin:  Negative for itching and rash.  Neurological:  Negative for dizziness, headaches and numbness.  Hematological:  Does not bruise/bleed easily.  Psychiatric/Behavioral:  Negative for depression, sleep disturbance and suicidal ideas. The patient is not nervous/anxious.   All other systems reviewed and are negative.    VITALS:   Blood pressure 134/89.  Wt Readings from Last 3 Encounters:  08/05/22 164 lb 11.2 oz (74.7 kg)  06/10/22 161 lb 6.4 oz (73.2 kg)  06/08/22 161 lb (73 kg)    There is no height or weight on file to calculate BMI.  Performance status (ECOG): 1 - Symptomatic but completely ambulatory  PHYSICAL EXAM:   Physical Exam Vitals and nursing note reviewed. Exam conducted with a chaperone present.  Constitutional:      Appearance: Normal appearance.  Cardiovascular:     Rate and Rhythm: Normal rate and regular rhythm.     Pulses: Normal pulses.     Heart sounds: Normal heart sounds.  Pulmonary:     Effort: Pulmonary effort is normal.     Breath sounds: Normal breath sounds.  Abdominal:     Palpations: Abdomen is soft. There is no hepatomegaly, splenomegaly or mass.     Tenderness: There is no abdominal tenderness.  Musculoskeletal:     Right lower leg: No edema.      Left lower leg: No edema.  Lymphadenopathy:     Cervical: No cervical adenopathy.     Right cervical: No superficial, deep or posterior cervical adenopathy.    Left cervical: No superficial, deep or posterior cervical adenopathy.     Upper Body:     Right upper body: No supraclavicular or axillary adenopathy.     Left upper body: No supraclavicular or axillary adenopathy.  Neurological:     General: No focal deficit present.     Mental Status: She is alert and oriented to person, place, and time.  Psychiatric:        Mood and Affect: Mood normal.        Behavior: Behavior normal.     LABS:      Latest Ref Rng & Units 08/26/2022    8:40 AM 06/10/2022   11:50 AM 04/20/2022   11:47 AM  CBC  WBC 4.0 - 10.5 K/uL 5.0  4.8  5.0   Hemoglobin 12.0 - 15.0 g/dL 16.1  09.6  04.5   Hematocrit 36.0 - 46.0 % 43.2  40.8  40.4   Platelets 150 - 400 K/uL 139  144  143       Latest Ref Rng & Units 08/26/2022    8:40 AM 06/10/2022   11:50 AM 04/20/2022   11:47 AM  CMP  Glucose 70 - 99 mg/dL 409  94  811   BUN 8 - 23 mg/dL 21  29  25    Creatinine 0.44 - 1.00 mg/dL 9.14  7.82  9.56   Sodium 135 - 145 mmol/L 139  140  140   Potassium 3.5 - 5.1 mmol/L 3.7  4.0  4.0   Chloride 98 - 111 mmol/L 102  102  103   CO2 22 - 32 mmol/L 28  29  28    Calcium 8.9 - 10.3 mg/dL 9.5  9.4  9.4   Total Protein 6.5 - 8.1 g/dL 8.0  7.8  7.6   Total Bilirubin 0.3 - 1.2 mg/dL 0.6  0.3  0.6   Alkaline Phos 38 - 126 U/L 73  72  72   AST 15 - 41 U/L 24  22  22    ALT 0 - 44 U/L 19  23  22       No results found for: "CEA1", "CEA" / No results found for: "CEA1", "CEA" No results found for: "PSA1" No results found for: "OZH086" No results found for: "CAN125"  No results found for: "TOTALPROTELP", "ALBUMINELP", "A1GS", "A2GS", "BETS", "BETA2SER", "GAMS", "MSPIKE", "SPEI" Lab Results  Component Value Date   TIBC 258 08/08/2020   FERRITIN 134 08/08/2020   IRONPCTSAT 28 08/08/2020   No results found for:  "LDH"   STUDIES:   NM PET Image Restag (PS) Skull Base To Thigh  Result Date: 08/31/2022 CLINICAL DATA:  Subsequent treatment strategy for stage IIIC left breast cancer. EXAM: NUCLEAR MEDICINE PET SKULL BASE TO THIGH TECHNIQUE: 8.7 mCi F-18 FDG was injected intravenously. Full-ring PET imaging was performed from the skull base to thigh after the radiotracer. CT data was obtained and used for attenuation correction and anatomic localization. Fasting blood glucose: 136 mg/dl COMPARISON:  57/84/6962 PET-CT. FINDINGS: Mediastinal blood pool activity: SUV max 2.1 Liver activity: SUV max NA  NECK: Mildly hypermetabolic 1.3 cm left supraclavicular lymph node with max SUV 3.6 (series 3/image 60), previously 1.2 cm with max SUV 5.3, not appreciably changed in size, decreased in metabolism. No newly enlarged or newly hypermetabolic neck lymph nodes. Well-positioned tracheostomy with tip in the tracheal lumen at the thoracic inlet. Incidental CT findings: Partially visualized right frontal approach ventricular catheter terminating in the frontal horn of the right lateral ventricle, unchanged. Visualized portion of right VP shunt catheter is intact and without kinking. Right greater than left lateral ventricle dilatation and dilated third ventricle, appearing mildly increased since 06/03/2022 PET-CT. Stable postsurgical changes from left suboccipital craniotomy. CHEST: Multiple enlarged hypermetabolic left axillary/left retropectoral lymph nodes are decreased in metabolism. Representative 1.9 cm left axillary node with max SUV 4.0 (series 3/image 85), previously 1.8 cm with max SUV 6.8, not appreciably changed in size and decreased in metabolism. No enlarged or hypermetabolic right axillary nodes. No enlarged or hypermetabolic mediastinal or hilar nodes. No hypermetabolic pulmonary findings. Outer left breast lesion demonstrates mild hypermetabolism with max SUV 2.8, previous max SUV 5.8, decreased. Incidental CT findings:  Mildly atherosclerotic nonaneurysmal thoracic aorta. Moderate centrilobular emphysema. ABDOMEN/PELVIS: No abnormal hypermetabolic activity within the liver, pancreas, adrenal glands, or spleen. No hypermetabolic lymph nodes in the abdomen or pelvis. Enlarged myomatous uterus with numerous partially calcified uterine fibroids up to 6.3 cm in the posterior right uterine body. Substantially increased hypermetabolism throughout the left central uterus with max SUV 19.1, previous max SUV 8.1. Incidental CT findings: Well-positioned percutaneous gastrostomy tube terminating in the anterior body of the stomach. Atherosclerotic nonaneurysmal abdominal aorta. SKELETON: No focal hypermetabolic activity to suggest skeletal metastasis. Incidental CT findings: None. IMPRESSION: 1. Substantially increased hypermetabolism throughout the left central uterus, nonspecific, cannot exclude endometrial hyperplasia or endometrial malignancy. Suggest GYN consultation. Pelvic ultrasound correlation and/or endometrial tissue sampling may be considered. 2. Hypermetabolic left axillary, left retropectoral and left supraclavicular lymphadenopathy and hypermetabolic outer left breast lesion are all decreased in metabolism since 06/03/2022 PET-CT, compatible with partial positive response to therapy. No new or progressive hypermetabolic metastatic disease. 3. Right greater than left lateral ventricle dilatation and dilated third ventricle, appearing mildly increased in dilatation since 06/03/2022 PET-CT. Consider head CT correlation as clinically warranted. 4. Aortic Atherosclerosis (ICD10-I70.0) and Emphysema (ICD10-J43.9). Electronically Signed   By: Delbert Phenix M.D.   On: 08/31/2022 14:47

## 2022-09-02 ENCOUNTER — Inpatient Hospital Stay: Payer: Medicaid Other

## 2022-09-02 ENCOUNTER — Other Ambulatory Visit: Payer: Medicaid Other

## 2022-09-02 ENCOUNTER — Inpatient Hospital Stay: Payer: Medicaid Other | Attending: Hematology | Admitting: Hematology

## 2022-09-02 ENCOUNTER — Encounter: Payer: Self-pay | Admitting: Hematology

## 2022-09-02 ENCOUNTER — Other Ambulatory Visit (HOSPITAL_COMMUNITY): Payer: Medicaid Other

## 2022-09-02 VITALS — BP 134/89

## 2022-09-02 DIAGNOSIS — J432 Centrilobular emphysema: Secondary | ICD-10-CM | POA: Diagnosis not present

## 2022-09-02 DIAGNOSIS — Z79890 Hormone replacement therapy (postmenopausal): Secondary | ICD-10-CM | POA: Diagnosis not present

## 2022-09-02 DIAGNOSIS — Z801 Family history of malignant neoplasm of trachea, bronchus and lung: Secondary | ICD-10-CM | POA: Insufficient documentation

## 2022-09-02 DIAGNOSIS — Z87891 Personal history of nicotine dependence: Secondary | ICD-10-CM | POA: Insufficient documentation

## 2022-09-02 DIAGNOSIS — Z803 Family history of malignant neoplasm of breast: Secondary | ICD-10-CM | POA: Diagnosis not present

## 2022-09-02 DIAGNOSIS — C50412 Malignant neoplasm of upper-outer quadrant of left female breast: Secondary | ICD-10-CM | POA: Insufficient documentation

## 2022-09-02 DIAGNOSIS — C50912 Malignant neoplasm of unspecified site of left female breast: Secondary | ICD-10-CM | POA: Diagnosis not present

## 2022-09-02 DIAGNOSIS — Z8041 Family history of malignant neoplasm of ovary: Secondary | ICD-10-CM | POA: Insufficient documentation

## 2022-09-02 DIAGNOSIS — I10 Essential (primary) hypertension: Secondary | ICD-10-CM | POA: Insufficient documentation

## 2022-09-02 DIAGNOSIS — C773 Secondary and unspecified malignant neoplasm of axilla and upper limb lymph nodes: Secondary | ICD-10-CM | POA: Diagnosis not present

## 2022-09-02 DIAGNOSIS — Z79899 Other long term (current) drug therapy: Secondary | ICD-10-CM | POA: Insufficient documentation

## 2022-09-02 DIAGNOSIS — Z8673 Personal history of transient ischemic attack (TIA), and cerebral infarction without residual deficits: Secondary | ICD-10-CM | POA: Insufficient documentation

## 2022-09-02 DIAGNOSIS — R591 Generalized enlarged lymph nodes: Secondary | ICD-10-CM | POA: Insufficient documentation

## 2022-09-02 DIAGNOSIS — Z170 Estrogen receptor positive status [ER+]: Secondary | ICD-10-CM | POA: Insufficient documentation

## 2022-09-02 MED ORDER — FULVESTRANT 250 MG/5ML IM SOSY
500.0000 mg | PREFILLED_SYRINGE | Freq: Once | INTRAMUSCULAR | Status: AC
  Administered 2022-09-02: 500 mg via INTRAMUSCULAR

## 2022-09-02 NOTE — Patient Instructions (Signed)
[facility]  Discharge Instructions  You were seen and examined today by Dr. Ellin Saba.  Dr. Ellin Saba discussed your most recent lab work and CT scan which revealed that everything looks good and stable with your breast cancer. It is showing a spot on your uterus that has grown.  Dr. Ellin Saba has ordered a Transvaginal ultrasound to be done before you see the Gynecologist.   Follow-up as scheduled in 2 months.    Thank you for choosing [facility] to provide your oncology and hematology care.   To afford each patient quality time with our provider, please arrive at least 15 minutes before your scheduled appointment time. You may need to reschedule your appointment if you arrive late (10 or more minutes). Arriving late affects you and other patients whose appointments are after yours.  Also, if you miss three or more appointments without notifying the office, you may be dismissed from the clinic at the provider's discretion.    Again, thank you for choosing [facility].  Our hope is that these requests will decrease the amount of time that you wait before being seen by our physicians.   If you have a lab appointment with the Cancer Center - please note that after April 8th, all labs will be drawn in the cancer center.  You do not have to check in or register with the main entrance as you have in the past but will complete your check-in at the cancer center.            _____________________________________________________________  Should you have questions after your visit to [facility], please contact our office at [phone] and follow the prompts.  Our office hours are 8:00 a.m. to 4:30 p.m. Monday - Thursday and 8:00 a.m. to 2:30 p.m. Friday.  Please note that voicemails left after 4:00 p.m. may not be returned until the following business day.  We are closed weekends and all major holidays.  You do  have access to a nurse 24-7, just call the main number to the clinic [phone] and do not press any options, hold on the line and a nurse will answer the phone.    For prescription refill requests, have your pharmacy contact our office and allow 72 hours.    Masks are no longer required in the cancer centers. If you would like for your care team to wear a mask while they are taking care of you, please let them know. You may have one support person who is at least 66 years old accompany you for your appointments.

## 2022-09-02 NOTE — Patient Instructions (Signed)
[facility] [address]  Discharge Instructions: Thank you for choosing [facility] to provide your oncology and hematology care.  If you have a lab appointment with the Cancer Center - please note that after April 8th, 2024, all labs will be drawn in the cancer center.  You do not have to check in or register with the main entrance as you have in the past but will complete your check-in in the cancer center.  Wear comfortable clothing and clothing appropriate for easy access to any Portacath or PICC line.   We strive to give you quality time with your provider. You may need to reschedule your appointment if you arrive late (15 or more minutes).  Arriving late affects you and other patients whose appointments are after yours.  Also, if you miss three or more appointments without notifying the office, you may be dismissed from the clinic at the provider's discretion.      For prescription refill requests, have your pharmacy contact our office and allow 72 hours for refills to be completed.    Today you received the following Faslodex, return as scheduled.    To help prevent nausea and vomiting after your treatment, we encourage you to take your nausea medication as directed.  BELOW ARE SYMPTOMS THAT SHOULD BE REPORTED IMMEDIATELY: *FEVER GREATER THAN 100.4 F (38 C) OR HIGHER *CHILLS OR SWEATING *NAUSEA AND VOMITING THAT IS NOT CONTROLLED WITH YOUR NAUSEA MEDICATION *UNUSUAL SHORTNESS OF BREATH *UNUSUAL BRUISING OR BLEEDING *URINARY PROBLEMS (pain or burning when urinating, or frequent urination) *BOWEL PROBLEMS (unusual diarrhea, constipation, pain near the anus) TENDERNESS IN MOUTH AND THROAT WITH OR WITHOUT PRESENCE OF ULCERS (sore throat, sores in mouth, or a toothache) UNUSUAL RASH, SWELLING OR PAIN  UNUSUAL VAGINAL DISCHARGE OR ITCHING   Items with * indicate a potential emergency and should be followed up as soon as possible or go to the Emergency Department if  any problems should occur.  Please show the CHEMOTHERAPY ALERT CARD or IMMUNOTHERAPY ALERT CARD at check-in to the Emergency Department and triage nurse.  Should you have questions after your visit or need to cancel or reschedule your appointment, please contact [facility] AT  [phone]  and follow the prompts.  Office hours are 8:00 a.m. to 4:30 p.m. Monday - Friday. Please note that voicemails left after 4:00 p.m. may not be returned until the following business day.  We are closed weekends and major holidays. You have access to a nurse at all times for urgent questions. Please call the main number to the clinic [phone] and follow the prompts.  For any non-urgent questions, you may also contact your provider using MyChart. We now offer e-Visits for anyone 18 and older to request care online for non-urgent symptoms. For details visit [email or web address].   Also download the MyChart app! Go to the app store, search "MyChart", open the app, select [place], and log in with your MyChart username and password.   

## 2022-09-02 NOTE — Progress Notes (Signed)
Patient tolerated Faslodex injection with no complaints voiced.  Bilateral sites clean and dry with no bruising or swelling noted.  Band aids applied. See MAR for details.  Patient stable during and after injections.  VSS with discharge and left in satisfactory condition with no s/s of distress noted.    °

## 2022-09-03 ENCOUNTER — Other Ambulatory Visit: Payer: Self-pay

## 2022-09-14 ENCOUNTER — Ambulatory Visit (HOSPITAL_COMMUNITY)
Admission: RE | Admit: 2022-09-14 | Discharge: 2022-09-14 | Disposition: A | Discharge status: Home / Self Care | Payer: Medicaid Other | Source: Ambulatory Visit | Attending: Hematology | Admitting: Hematology

## 2022-09-14 ENCOUNTER — Other Ambulatory Visit: Payer: Self-pay | Admitting: Hematology

## 2022-09-14 DIAGNOSIS — C50912 Malignant neoplasm of unspecified site of left female breast: Secondary | ICD-10-CM

## 2022-09-14 DIAGNOSIS — Z170 Estrogen receptor positive status [ER+]: Secondary | ICD-10-CM | POA: Insufficient documentation

## 2022-09-20 ENCOUNTER — Emergency Department (HOSPITAL_COMMUNITY)
Admission: EM | Admit: 2022-09-20 | Discharge: 2022-09-20 | Disposition: A | Discharge status: Skilled Nursing Facility | Payer: Medicaid Other | Attending: Emergency Medicine | Admitting: Emergency Medicine

## 2022-09-20 ENCOUNTER — Other Ambulatory Visit (HOSPITAL_COMMUNITY): Payer: Self-pay | Admitting: Nurse Practitioner

## 2022-09-20 ENCOUNTER — Encounter (HOSPITAL_COMMUNITY): Payer: Self-pay

## 2022-09-20 ENCOUNTER — Other Ambulatory Visit: Payer: Self-pay

## 2022-09-20 DIAGNOSIS — K9423 Gastrostomy malfunction: Secondary | ICD-10-CM | POA: Insufficient documentation

## 2022-09-20 DIAGNOSIS — Z79899 Other long term (current) drug therapy: Secondary | ICD-10-CM | POA: Insufficient documentation

## 2022-09-20 DIAGNOSIS — Z431 Encounter for attention to gastrostomy: Secondary | ICD-10-CM

## 2022-09-20 DIAGNOSIS — K9420 Gastrostomy complication, unspecified: Secondary | ICD-10-CM

## 2022-09-20 NOTE — ED Provider Notes (Signed)
[place] EMERGENCY DEPARTMENT AT [facility] Provider Note   CSN: [id number] Arrival date & time: 09/20/22  0847     History  Chief Complaint  Patient presents with   feeding tube needs to be changed   HPI Stacy Moran is a 67 y.o. female with tracheostomy dependence, seizures and G-tube dependence presenting for feeding tube problem.  States she has had the same G-tube in place for the last 4 years when it was placed.  States she noticed the tube started leaking last week and is gotten progressively worse.  Denies any bleeding coming from the tube.  Denies abdominal pain nausea vomiting diarrhea.  Nuys fever or chills.  HPI     Home Medications Prior to Admission medications   Medication Sig Start Date End Date Taking? Authorizing Provider  acetaminophen (TYLENOL) 325 MG tablet Place 2 tablets (650 mg total) into feeding tube every 6 (six) hours as needed for mild pain. 05/20/20   Angiulli, Mcarthur Rossetti, PA-C  ALPRAZolam Prudy Feeler) 0.5 MG tablet Take 0.5 mg by mouth 3 (three) times daily as needed. 04/23/21   [provider]  amLODipine (NORVASC) 5 MG tablet Place 1 tablet (5 mg total) into feeding tube daily. 02/19/21   Lynn Ito, MD  anastrozole (ARIMIDEX) 1 MG tablet Take 1 tablet (1 mg total) by mouth daily. 06/29/21   Doreatha Massed, MD  chlorhexidine (PERIDEX) 0.12 % solution 15 mLs by Mouth Rinse route 2 (two) times daily. 10/07/20   Jackelyn Poling, DO  DULoxetine (CYMBALTA) 20 MG capsule Take 20 mg by mouth daily. 05/20/21   [provider]  fluticasone (FLONASE) 50 MCG/ACT nasal spray Place 1 spray into both nostrils 2 (two) times daily. 02/18/21   Lynn Ito, MD  glycopyrrolate (ROBINUL) 1 MG tablet Take 1 tablet (1 mg total) by mouth 2 (two) times daily. Patient taking differently: Place 1 mg into feeding tube 2 (two) times daily. 02/18/21   Lynn Ito, MD  guaiFENesin (ROBITUSSIN) 100 MG/5ML liquid Place 10 mLs into feeding tube every 8  (eight) hours. 02/18/21   Lynn Ito, MD  ipratropium-albuterol (DUONEB) 0.5-2.5 (3) MG/3ML SOLN Take 3 mLs by nebulization every 4 (four) hours as needed. 12/21/20   Osvaldo Shipper, MD  levETIRAcetam (KEPPRA) 100 MG/ML solution Place 7.5 mLs (750 mg total) into feeding tube 2 (two) times daily. 10/07/20   Jackelyn Poling, DO  levothyroxine (SYNTHROID) 100 MCG tablet Place 1 tablet (100 mcg total) into feeding tube daily at 6 (six) AM. 07/16/20   Marcine Matar, MD  Nutritional Supplements (FEEDING SUPPLEMENT, OSMOLITE 1.2 CAL,) LIQD Place 355 mLs into feeding tube 5 (five) times daily. 02/18/21   Lynn Ito, MD  polyethylene glycol (MIRALAX / GLYCOLAX) 17 g packet Place 17 g into feeding tube 2 (two) times daily. 10/07/20   Jackelyn Poling, DO  QUEtiapine (SEROQUEL) 50 MG tablet Place 1 tablet (50 mg total) into feeding tube 2 (two) times daily. 12/21/20 09/02/22  Osvaldo Shipper, MD  senna (SENOKOT) 8.6 MG TABS tablet Place 1 tablet (8.6 mg total) into feeding tube daily. 10/08/20   Jackelyn Poling, DO  Water For Irrigation, Sterile (FREE WATER) SOLN Place 150 mLs into feeding tube every 6 (six) hours. 02/18/21   Lynn Ito, MD  hydrALAZINE (APRESOLINE) 50 MG tablet Place 1 tablet (50 mg total) into feeding tube every 8 (eight) hours. 08/09/20 10/07/20  Marcine Matar, MD      Allergies    Patient has no known allergies.  Review of Systems   See HPI for pertinent positives  Physical Exam Updated Vital Signs BP 130/80   Pulse 78   Temp (!) 97.1 F (36.2 C) (Oral)   Resp 17   Ht 5\' 6"  (1.676 m)   Wt 74.7 kg   SpO2 98%   BMI 26.58 kg/m  Physical Exam Constitutional:      Appearance: Normal appearance.  HENT:     Head: Normocephalic.     Nose: Nose normal.  Eyes:     Conjunctiva/sclera: Conjunctivae normal.  Pulmonary:     Effort: Pulmonary effort is normal.  Abdominal:     Comments: Gtube noted.  Insertion site without oozing, bleeding, tenderness swelling or erythema.  The  G-tube itself appears to be leaking.  Content is gastric, nonpustular.  Neurological:     Mental Status: She is alert.  Psychiatric:        Mood and Affect: Mood normal.     ED Results / Procedures / Treatments   Labs (all labs ordered are listed, but only abnormal results are displayed) Labs Reviewed - No data to display  EKG None  Radiology No results found.  Procedures Procedures    Medications Ordered in ED Medications - No data to display  ED Course/ Medical Decision Making/ A&P                             Medical Decision Making  67 year old female presenting for gastric tube problem.  Exam notable for leaking from the tube but no signs of associated infection.  Upon review of her chart, the tube was placed it appears 4 years ago.  Advised her to follow-up with Dr. Gevena Mart for further evaluation and management.  We dressed the G-tube site and applied some tape around the tube to prevent from leaking.  Vital stable throughout encounter.  Discussed return precautions.  Discharged home in good condition.        Final Clinical Impression(s) / ED Diagnoses Final diagnoses:  Problem with gastrostomy tube [facility])    Rx / DC Orders ED Discharge Orders     None         Gareth Eagle, PA-C 09/20/22 0955    Gloris Manchester, MD 09/21/22 [phone]

## 2022-09-20 NOTE — Discharge Instructions (Signed)
Evaluation today is overall reassuring.  It does appear that you do have some leaking from the G-tube.  Recommend you follow-up with [facility] interventional radiology as they were the ones that placed the tube.  I have provided their contact information or discharge summary.  If you start develop abdominal pain, pustulant discharge from the G-tube site, redness, swelling or bleeding from the site or any other concern please return emergency department further evaluation.

## 2022-09-20 NOTE — ED Triage Notes (Signed)
Pt bib REMS from [facility]. [facility] states pt feeding tube needs to be changed d/t having holes in tube and leaking. Pt states she has had the tube feeding for 4 years, pt also has a trach.

## 2022-09-22 ENCOUNTER — Ambulatory Visit (HOSPITAL_COMMUNITY)
Admission: RE | Admit: 2022-09-22 | Discharge: 2022-09-22 | Disposition: A | Discharge status: Home / Self Care | Payer: Medicaid Other | Source: Ambulatory Visit | Attending: Nurse Practitioner | Admitting: Nurse Practitioner

## 2022-09-22 DIAGNOSIS — Z431 Encounter for attention to gastrostomy: Secondary | ICD-10-CM | POA: Diagnosis present

## 2022-09-22 HISTORY — PX: IR REPLC GASTRO/COLONIC TUBE PERCUT W/FLUORO: IMG2333

## 2022-09-22 MED ORDER — LIDOCAINE VISCOUS HCL 2 % MT SOLN
OROMUCOSAL | Status: AC
  Filled 2022-09-22: qty 15

## 2022-09-22 MED ORDER — IOHEXOL 300 MG/ML  SOLN
50.0000 mL | Freq: Once | INTRAMUSCULAR | Status: AC | PRN
  Administered 2022-09-22: 10 mL

## 2022-09-27 ENCOUNTER — Encounter: Payer: Self-pay | Admitting: Obstetrics & Gynecology

## 2022-09-27 ENCOUNTER — Ambulatory Visit (INDEPENDENT_AMBULATORY_CARE_PROVIDER_SITE_OTHER): Payer: Medicaid Other | Admitting: Obstetrics & Gynecology

## 2022-09-27 VITALS — BP 122/77 | HR 90

## 2022-09-27 DIAGNOSIS — R9389 Abnormal findings on diagnostic imaging of other specified body structures: Secondary | ICD-10-CM

## 2022-09-27 NOTE — Progress Notes (Signed)
Follow up appointment for results: ES 17 mm  Chief Complaint  Patient presents with   Consult    F/u from ultrasound and pet scan.     Blood pressure 122/77, pulse 90.  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   Orders for this encounter: No orders of the defined types were placed in this encounter.   Impression + Management Plan   ICD-10-CM   1. Thickened endometrium: 17 mm, no PMB  R93.89    could not do EMBx due to fibroids distorting anatomy, recommend surgical eval if bleeding becomes an issue, she is on arimidex which decreases her risks      Follow Up: No follow-ups on file.     All questions were answered.  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)    Stroke (HCC)    Subdural hematoma (HCC)    Tracheostomy dependent (HCC)    Tracheostomy present [facility])     Past Surgical History:  Procedure Laterality Date   BURR HOLE N/A 03/18/2020   Procedure: Ezekiel Ina;  Surgeon: Lisbeth Renshaw, MD;   Location[facility];  Service: Neurosurgery;  Laterality: N/A;   CRANIOTOMY Left 01/30/2020   Procedure: LEFT FAR LATERAL CRANIOTOMY FOR ANEURYSM CLIPPING;  Surgeon: Lisbeth Renshaw, MD;  Location: [facility] OR;  Service: Neurosurgery;  Laterality: Left;   DIRECT LARYNGOSCOPY N/A 02/29/2020   Procedure: DIRECT LARYNGOSCOPY;  Surgeon: Serena Colonel, MD;  Location: [facility] OR;  Service: ENT;  Laterality: N/A;   DIRECT LARYNGOSCOPY N/A 10/03/2020   Procedure: DIRECT LARYNGOSCOPY w/esophagascopy;  Surgeon: Serena Colonel, MD;  Location: [facility] OR;  Service: ENT;  Laterality: N/A;   INTUBATION-ENDOTRACHEAL WITH TRACHEOSTOMY STANDBY N/A 12/14/2020   Procedure: Alcario Drought WITH TRACHEOSTOMY STANDBY;  Surgeon: Bud Face, MD;  Location: [facility] ORS;  Service: ENT;  Laterality: N/A;   IR ANGIO INTRA EXTRACRAN SEL INTERNAL CAROTID BILAT MOD SED  01/30/2020   IR ANGIO VERTEBRAL SEL VERTEBRAL UNI L MOD SED  01/30/2020   IR CM INJ ANY COLONIC TUBE W/FLUORO  07/03/2020   IR GASTROSTOMY TUBE MOD SED  02/22/2020   IR PATIENT EVAL TECH 0-60 MINS  02/11/2022   IR REPLC GASTRO/COLONIC TUBE PERCUT W/FLUORO  07/03/2020   IR REPLC GASTRO/COLONIC TUBE PERCUT W/FLUORO  09/22/2022   LAPAROSCOPIC REVISION VENTRICULAR-PERITONEAL (V-P) SHUNT N/A 03/18/2020   Procedure: LAPAROSCOPIC REVISION VENTRICULAR-PERITONEAL (V-P) SHUNT;  Surgeon: Fritzi Mandes, MD;  Location: [facility] OR;  Service: General;  Laterality: N/A;   RADIOLOGY WITH ANESTHESIA N/A 01/30/2020   Procedure: IR WITH ANESTHESIA;  Surgeon: Lisbeth Renshaw, MD;  Location: [facility] OR;  Service: Radiology;  Laterality: N/A;   THYROIDECTOMY N/A 02/08/2020   Procedure: THYROIDECTOMY;  Surgeon: Serena Colonel, MD;  Location: [facility] OR;  Service: ENT;  Laterality: N/A;   TRACHEOSTOMY TUBE PLACEMENT N/A 02/08/2020   Procedure: TRACHEOSTOMY;  Surgeon: Serena Colonel, MD;  Location: [facility] OR;  Service: ENT;  Laterality: N/A;   TRACHEOSTOMY TUBE PLACEMENT N/A 02/29/2020   Procedure: TRACHEOSTOMY EXCHANGE;   Surgeon: Serena Colonel, MD;  Location: [facility] OR;  Service: ENT;  Laterality: N/A;   VENTRICULOPERITONEAL SHUNT N/A 03/18/2020   Procedure: RIGHT SHUNT INSERTION VENTRICULAR-PERITONEAL/ BURR HOLE Evacuation of Subdural Hematoma;  Surgeon: Lisbeth Renshaw, MD;  Location[facility] OR;  Service: Neurosurgery;  Laterality: N/A;    OB History   No obstetric history on file.     No Known Allergies  Social History   Socioeconomic History   Marital status: Single    Spouse name: Not on file   Number of children: Not on file   Years of education: Not on file   Highest education level: Not on file  Occupational History   Not on file  Tobacco Use   Smoking status: Former   Smokeless tobacco: Never  Vaping Use   Vaping Use: Never used  Substance and Sexual Activity   Alcohol use: Not Currently   Drug use: Not Currently   Sexual activity: Not Currently  Other Topics Concern   Not on file  Social History Narrative   Not on file   Social Determinants of Health   Financial Resource Strain: Not on file  Food Insecurity: Not on file  Transportation Needs: Not on file  Physical Activity: Not on file  Stress: Not on file  Social Connections: Not on file    Family History  Problem Relation Age of Onset   Breast cancer Sister    Ovarian cancer Sister        dx 30s,d. 30s   Brain cancer Daughter        dx 67s

## 2022-10-01 ENCOUNTER — Inpatient Hospital Stay: Payer: Medicaid Other | Attending: Hematology

## 2022-10-01 VITALS — BP 122/74 | HR 94 | Temp 98.4°F | Resp 20

## 2022-10-01 DIAGNOSIS — C50412 Malignant neoplasm of upper-outer quadrant of left female breast: Secondary | ICD-10-CM | POA: Diagnosis present

## 2022-10-01 DIAGNOSIS — Z79818 Long term (current) use of other agents affecting estrogen receptors and estrogen levels: Secondary | ICD-10-CM | POA: Diagnosis not present

## 2022-10-01 DIAGNOSIS — C50912 Malignant neoplasm of unspecified site of left female breast: Secondary | ICD-10-CM

## 2022-10-01 MED ORDER — FULVESTRANT 250 MG/5ML IM SOSY
500.0000 mg | PREFILLED_SYRINGE | Freq: Once | INTRAMUSCULAR | Status: AC
  Administered 2022-10-01: 500 mg via INTRAMUSCULAR

## 2022-10-01 NOTE — Patient Instructions (Signed)
[facility]  Discharge Instructions: Thank you for choosing [facility] to provide your oncology and hematology care.  If you have a lab appointment with the Cancer Center - please note that after April 8th, 2024, all labs will be drawn in the cancer center.  You do not have to check in or register with the main entrance as you have in the past but will complete your check-in in the cancer center.  Wear comfortable clothing and clothing appropriate for easy access to any Portacath or PICC line.   We strive to give you quality time with your provider. You may need to reschedule your appointment if you arrive late (15 or more minutes).  Arriving late affects you and other patients whose appointments are after yours.  Also, if you miss three or more appointments without notifying the office, you may be dismissed from the clinic at the provider's discretion.      For prescription refill requests, have your pharmacy contact our office and allow 72 hours for refills to be completed.    Today you received your faslodex injections   To help prevent nausea and vomiting after your treatment, we encourage you to take your nausea medication as directed.  BELOW ARE SYMPTOMS THAT SHOULD BE REPORTED IMMEDIATELY: *FEVER GREATER THAN 100.4 F (38 C) OR HIGHER *CHILLS OR SWEATING *NAUSEA AND VOMITING THAT IS NOT CONTROLLED WITH YOUR NAUSEA MEDICATION *UNUSUAL SHORTNESS OF BREATH *UNUSUAL BRUISING OR BLEEDING *URINARY PROBLEMS (pain or burning when urinating, or frequent urination) *BOWEL PROBLEMS (unusual diarrhea, constipation, pain near the anus) TENDERNESS IN MOUTH AND THROAT WITH OR WITHOUT PRESENCE OF ULCERS (sore throat, sores in mouth, or a toothache) UNUSUAL RASH, SWELLING OR PAIN  UNUSUAL VAGINAL DISCHARGE OR ITCHING   Items with * indicate a potential emergency and should be followed up as soon as possible or go to the Emergency Department if any problems should  occur.  Please show the CHEMOTHERAPY ALERT CARD or IMMUNOTHERAPY ALERT CARD at check-in to the Emergency Department and triage nurse.  Should you have questions after your visit or need to cancel or reschedule your appointment, please contact [facility] CENTER AT [facility] [phone]  and follow the prompts.  Office hours are 8:00 a.m. to 4:30 p.m. Monday - Friday. Please note that voicemails left after 4:00 p.m. may not be returned until the following business day.  We are closed weekends and major holidays. You have access to a nurse at all times for urgent questions. Please call the main number to the clinic [phone] and follow the prompts.  For any non-urgent questions, you may also contact your provider using MyChart. We now offer e-Visits for anyone 67 and older to request care online for non-urgent symptoms. For details visit [email or web address].   Also download the MyChart app! Go to the app store, search "MyChart", open the app, select [place], and log in with your MyChart username and password.

## 2022-10-01 NOTE — Progress Notes (Signed)
Faslodex injections given per orders. Patient tolerated it well without problems. Vitals stable and discharged home from clinic ambulatory. Follow up as scheduled.

## 2022-10-20 ENCOUNTER — Other Ambulatory Visit: Payer: Self-pay

## 2022-10-29 ENCOUNTER — Inpatient Hospital Stay: Payer: Medicaid Other | Attending: Hematology

## 2022-10-29 ENCOUNTER — Inpatient Hospital Stay: Payer: Medicaid Other

## 2022-10-29 VITALS — BP 114/73 | HR 83 | Temp 97.9°F | Resp 20

## 2022-10-29 DIAGNOSIS — C50412 Malignant neoplasm of upper-outer quadrant of left female breast: Secondary | ICD-10-CM | POA: Diagnosis present

## 2022-10-29 DIAGNOSIS — C50912 Malignant neoplasm of unspecified site of left female breast: Secondary | ICD-10-CM

## 2022-10-29 DIAGNOSIS — Z170 Estrogen receptor positive status [ER+]: Secondary | ICD-10-CM | POA: Diagnosis not present

## 2022-10-29 DIAGNOSIS — Z79818 Long term (current) use of other agents affecting estrogen receptors and estrogen levels: Secondary | ICD-10-CM | POA: Diagnosis not present

## 2022-10-29 LAB — COMPREHENSIVE METABOLIC PANEL
ALT: 14 U/L (ref 0–44)
AST: 30 U/L (ref 15–41)
Albumin: 3.8 g/dL (ref 3.5–5.0)
Alkaline Phosphatase: 62 U/L (ref 38–126)
Anion gap: 8 (ref 5–15)
BUN: 20 mg/dL (ref 8–23)
CO2: 28 mmol/L (ref 22–32)
Calcium: 9.2 mg/dL (ref 8.9–10.3)
Chloride: 102 mmol/L (ref 98–111)
Creatinine, Ser: 0.76 mg/dL (ref 0.44–1.00)
GFR, Estimated: >60 mL/min (ref >60)
Glucose, Bld: 158 mg/dL — ABNORMAL HIGH (ref 70–99)
Potassium: 3.4 mmol/L — ABNORMAL LOW (ref 3.5–5.1)
Sodium: 138 mmol/L (ref 135–145)
Total Bilirubin: 0.3 mg/dL (ref 0.3–1.2)
Total Protein: 7.4 g/dL (ref 6.5–8.1)

## 2022-10-29 LAB — CBC WITH DIFFERENTIAL/PLATELET
Abs Immature Granulocytes: 0.01 10*3/uL (ref 0.00–0.07)
Basophils Absolute: 0 10*3/uL (ref 0.0–0.1)
Basophils Relative: 0 %
Eosinophils Absolute: 0.1 10*3/uL (ref 0.0–0.5)
Eosinophils Relative: 2 %
HCT: 41.9 % (ref 36.0–46.0)
Hemoglobin: 13.2 g/dL (ref 12.0–15.0)
Immature Granulocytes: 0 %
Lymphocytes Relative: 29 %
Lymphs Abs: 1.6 10*3/uL (ref 0.7–4.0)
MCH: 25.6 pg — ABNORMAL LOW (ref 26.0–34.0)
MCHC: 31.5 g/dL (ref 30.0–36.0)
MCV: 81.4 fL (ref 80.0–100.0)
Monocytes Absolute: 0.4 10*3/uL (ref 0.1–1.0)
Monocytes Relative: 6 %
Neutro Abs: 3.4 10*3/uL (ref 1.7–7.7)
Neutrophils Relative %: 63 %
Platelets: 159 10*3/uL (ref 150–400)
RBC: 5.15 MIL/uL — ABNORMAL HIGH (ref 3.87–5.11)
RDW: 15 % (ref 11.5–15.5)
WBC: 5.4 10*3/uL (ref 4.0–10.5)
nRBC: 0 % (ref 0.0–0.2)

## 2022-10-29 MED ORDER — FULVESTRANT 250 MG/5ML IM SOSY
500.0000 mg | PREFILLED_SYRINGE | Freq: Once | INTRAMUSCULAR | Status: AC
  Administered 2022-10-29: 500 mg via INTRAMUSCULAR
  Filled 2022-10-29: qty 10

## 2022-10-29 NOTE — Progress Notes (Signed)
Patient here for Faslodex injection. Patient tolerated injections with no complaints voiced.  Sites clean and dry with no bruising or swelling noted.  No complaints of pain.  Discharged with vital signs stable and no signs or symptoms of distress noted.

## 2022-10-29 NOTE — Patient Instructions (Signed)
[facility]  Discharge Instructions: Thank you for choosing [facility] to provide your oncology and hematology care.  If you have a lab appointment with the Cancer Center - please note that after April 8th, 2024, all labs will be drawn in the cancer center.  You do not have to check in or register with the main entrance as you have in the past but will complete your check-in in the cancer center.  Wear comfortable clothing and clothing appropriate for easy access to any Portacath or PICC line.   We strive to give you quality time with your provider. You may need to reschedule your appointment if you arrive late (15 or more minutes).  Arriving late affects you and other patients whose appointments are after yours.  Also, if you miss three or more appointments without notifying the office, you may be dismissed from the clinic at the provider's discretion.      For prescription refill requests, have your pharmacy contact our office and allow 72 hours for refills to be completed.    Today you received the following chemotherapy and/or immunotherapy agents Faslodex.  Fulvestrant Injection What is this medication? FULVESTRANT (ful VES trant) treats breast cancer. It works by blocking the hormone estrogen in breast tissue, which prevents breast cancer cells from spreading or growing. This medicine may be used for other purposes; ask your health care provider or pharmacist if you have questions. COMMON BRAND NAME(S): FASLODEX What should I tell my care team before I take this medication? They need to know if you have any of these conditions: Bleeding disorder Liver disease Low blood cell levels, such as low white cells, red cells, and platelets An unusual or allergic reaction to fulvestrant, other medications, foods, dyes, or preservatives Pregnant or trying to get pregnant Breast-feeding How should I use this medication? This medication is injected into a  muscle. It is given by your care team in a hospital or clinic setting. Talk to your care team about the use of this medication in children. Special care may be needed. Overdosage: If you think you have taken too much of this medicine contact a poison control center or emergency room at once. NOTE: This medicine is only for you. Do not share this medicine with others. What if I miss a dose? Keep appointments for follow-up doses. It is important not to miss your dose. Call your care team if you are unable to keep an appointment. What may interact with this medication? Certain medications that prevent or treat blood clots, such as warfarin, enoxaparin, dalteparin, apixaban, dabigatran, rivaroxaban This list may not describe all possible interactions. Give your health care provider a list of all the medicines, herbs, non-prescription drugs, or dietary supplements you use. Also tell them if you smoke, drink alcohol, or use illegal drugs. Some items may interact with your medicine. What should I watch for while using this medication? Your condition will be monitored carefully while you are receiving this medication. You may need blood work while taking this medication. Talk to your care team if you may be pregnant. Serious birth defects can occur if you take this medication during pregnancy and for 1 year after the last dose. You will need a negative pregnancy test before starting this medication. Contraception is recommended while taking this medication and for 1 year after the last dose. Your care team can help you find the option that works for you. Do not breastfeed while taking this medication and  for 1 year after the last dose. This medication may cause infertility. Talk to your care team if you are concerned about your fertility. What side effects may I notice from receiving this medication? Side effects that you should report to your care team as soon as possible: Allergic reactions or  angioedema--skin rash, itching or hives, swelling of the face, eyes, lips, tongue, arms, or legs, trouble swallowing or breathing Pain, tingling, or numbness in the hands or feet Side effects that usually do not require medical attention (report to your care team if they continue or are bothersome): Bone, joint, or muscle pain Constipation Headache Hot flashes Nausea Pain, redness, or irritation at injection site Unusual weakness or fatigue This list may not describe all possible side effects. Call your doctor for medical advice about side effects. You may report side effects to FDA at 1-800-FDA-1088. Where should I keep my medication? This medication is given in a hospital or clinic. It will not be stored at home. NOTE: This sheet is a summary. It may not cover all possible information. If you have questions about this medicine, talk to your doctor, pharmacist, or health care provider.  2024 Elsevier/Gold Standard (2021-07-28 00:00:00)        To help prevent nausea and vomiting after your treatment, we encourage you to take your nausea medication as directed.  BELOW ARE SYMPTOMS THAT SHOULD BE REPORTED IMMEDIATELY: *FEVER GREATER THAN 100.4 F (38 C) OR HIGHER *CHILLS OR SWEATING *NAUSEA AND VOMITING THAT IS NOT CONTROLLED WITH YOUR NAUSEA MEDICATION *UNUSUAL SHORTNESS OF BREATH *UNUSUAL BRUISING OR BLEEDING *URINARY PROBLEMS (pain or burning when urinating, or frequent urination) *BOWEL PROBLEMS (unusual diarrhea, constipation, pain near the anus) TENDERNESS IN MOUTH AND THROAT WITH OR WITHOUT PRESENCE OF ULCERS (sore throat, sores in mouth, or a toothache) UNUSUAL RASH, SWELLING OR PAIN  UNUSUAL VAGINAL DISCHARGE OR ITCHING   Items with * indicate a potential emergency and should be followed up as soon as possible or go to the Emergency Department if any problems should occur.  Please show the CHEMOTHERAPY ALERT CARD or IMMUNOTHERAPY ALERT CARD at check-in to the Emergency  Department and triage nurse.  Should you have questions after your visit or need to cancel or reschedule your appointment, please contact [facility] [phone]  and follow the prompts.  Office hours are 8:00 a.m. to 4:30 p.m. Monday - Friday. Please note that voicemails left after 4:00 p.m. may not be returned until the following business day.  We are closed weekends and major holidays. You have access to a nurse at all times for urgent questions. Please call the main number to the clinic [phone] and follow the prompts.  For any non-urgent questions, you may also contact your provider using MyChart. We now offer e-Visits for anyone 30 and older to request care online for non-urgent symptoms. For details visit [email or web address].   Also download the MyChart app! Go to the app store, search "MyChart", open the app, select [place], and log in with your MyChart username and password.

## 2022-11-03 NOTE — Progress Notes (Incomplete)
[facility] [address] [place] [address]    Clinic Day:  11/04/2022  Referring physician: Sherol Dade, *  Patient Care Team: Sherol Dade, DO as PCP - General (Family Medicine) Doreatha Massed, MD as Medical Oncologist (Medical Oncology)   ASSESSMENT & PLAN:   Assessment: 1.  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 - Left breast biopsy on 06/16/2021 - Pathology: Invasive ductal carcinoma, grade 2/3, ER 80% positive, weak to moderate staining, PR 30% positive, moderate staining, Ki-67-1%.  HER2 was 2+ by IHC and negative by FISH. - PET scan on 06/11/2021: Hypermetabolic left breast mass and enlarged hypermetabolic left axillary lymph nodes extending to the subpectoral nodal station.  Skin thickening in the left axilla.  No distant metastatic disease. - Anastrozole from 06/29/2021 through 06/08/2022 with progression after initial response - PET scan (06/03/2022): New left supraclavicular lymph node and progressive left axillary and subpectoral lymph node with persistent left breast mass. - Faslodex started on 06/10/2022  2.  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  3.  Social/family history: - She used to live with her daughter prior to recent placement at [facility] since June/July of 2022.  She used to work at [employer].  Quit smoking 2 years ago.  Smoked half pack per day for 45 years. - Sister had breast cancer.  Daughter had lung cancer.  Mother had lung cancer.  Another sister had ovarian cancer.    Plan: 1.  Stage IIIb (T2 N3 M0) left breast IDC, ER/PR positive and HER2 negative: - She has met with Dr. Lovell Sheehan.  Surgery was not recommended. - Faslodex was started on 06/10/2022. - She has some injection site pains otherwise she is  tolerating Faslodex very well. - Reviewed labs today which showed normal LFTs and creatinine.  CBC grossly normal.  CA 15-3 was 21.6 and CA 27-29 was 32. - PET scan (08/26/2022): Hypermetabolic left axillary, left retropectoral, left supraclavicular and left breast lesion overall decreased in metabolism since 06/03/2022.  No new or progressive hypermetabolic disease. - She does not have any headaches or mental status changes.  Will closely monitor.  If there is any changes will do brain imaging. - Continue Faslodex monthly.  RTC 2 months with repeat tumor markers.  2.  Bone health: - DEXA scan on 07/06/2021 with T-score -0.1. - Last vitamin D level is 43.  Continue calcium and vitamin D supplements.  3.  Hypermetabolism in the left central uterus: - PET scan showed incidental increased hypermetabolism throughout the left central uterus, nonspecific, cannot exclude endometrial hyperplasia or malignancy. - She reports occasional spotting. - Will order transvaginal pelvic ultrasound. - Will make referral for GYN consultation.    No orders of the defined types were placed in this encounter.      Alben Deeds Teague,acting as a Neurosurgeon for Doreatha Massed, MD.,have documented all relevant documentation on the behalf of Doreatha Massed, MD,as directed by  Doreatha Massed, MD while in the presence of Doreatha Massed, MD.  ***   [place]   8/7/202412:56 PM  CHIEF COMPLAINT:   Diagnosis: left breast IDC, ER positive   Cancer Staging  Breast cancer, left breast [facility]) Staging form: Breast, AJCC 8th Edition - Clinical stage from 06/28/2021: Stage IIIB (cT2,  cN3, cM0, G2, ER+, PR+, HER2-) - Unsigned    Prior Therapy: Anastrozole   Current Therapy:  Faslodex    HISTORY OF PRESENT ILLNESS:   Oncology History  Breast cancer, left breast (HCC)  06/28/2021 Initial Diagnosis   Breast cancer, left breast (HCC)   06/10/2022 -  Chemotherapy   Patient is on Treatment Plan :  BREAST Fulvestrant q28d        INTERVAL HISTORY:   Stacy Moran is a 67 y.o. female presenting to clinic today for follow up of left breast IDC, ER positive. She was last seen by me on 09/02/22.  Since her last visit, she presented to the ED on 09/20/22 for worsening leaking of her G tube. The G-tube site was dressed and tape was applied to prevent from leaking. She was discharged the same day.   She also underwent transabdominal CT on 09/14/22 that found: enlarged fibroid uterus; endometrium somewhat difficult to visualize demonstrating slight irregular shape with greatest thickness measuring 1.7 cm; a 3.4 cm simple left ovarian cyst; and a nonvisualized right ovary.   Today, she states that she is doing well overall. Her appetite level is at ***%. Her energy level is at ***%.  PAST MEDICAL HISTORY:   Past Medical History: 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)    Stroke (HCC)    Subdural hematoma (HCC)    Tracheostomy dependent (HCC)    Tracheostomy present [facility])     Surgical History: Past Surgical History:  Procedure Laterality Date   BURR HOLE N/A 03/18/2020   Procedure: Ezekiel Ina;  Surgeon: Lisbeth Renshaw, MD;  Location: [facility] OR;  Service: Neurosurgery;  Laterality: N/A;   CRANIOTOMY Left 01/30/2020   Procedure: LEFT FAR LATERAL CRANIOTOMY FOR ANEURYSM CLIPPING;  Surgeon: Lisbeth Renshaw, MD;  Location: [facility] OR;  Service: Neurosurgery;  Laterality: Left;   DIRECT LARYNGOSCOPY N/A 02/29/2020   Procedure: DIRECT LARYNGOSCOPY;  Surgeon: Serena Colonel, MD;  Location[facility] OR;  Service: ENT;  Laterality: N/A;   DIRECT LARYNGOSCOPY N/A 10/03/2020   Procedure: DIRECT LARYNGOSCOPY w/esophagascopy;  Surgeon: Serena Colonel, MD;  Location: [facility] OR;  Service: ENT;  Laterality: N/A;    INTUBATION-ENDOTRACHEAL WITH TRACHEOSTOMY STANDBY N/A 12/14/2020   Procedure: Alcario Drought WITH TRACHEOSTOMY STANDBY;  Surgeon: Bud Face, MD;  Location: [facility] ORS;  Service: ENT;  Laterality: N/A;   IR ANGIO INTRA EXTRACRAN SEL INTERNAL CAROTID BILAT MOD SED  01/30/2020   IR ANGIO VERTEBRAL SEL VERTEBRAL UNI L MOD SED  01/30/2020   IR CM INJ ANY COLONIC TUBE W/FLUORO  07/03/2020   IR GASTROSTOMY TUBE MOD SED  02/22/2020   IR PATIENT EVAL TECH 0-60 MINS  02/11/2022   IR REPLC GASTRO/COLONIC TUBE PERCUT W/FLUORO  07/03/2020   IR REPLC GASTRO/COLONIC TUBE PERCUT W/FLUORO  09/22/2022   LAPAROSCOPIC REVISION VENTRICULAR-PERITONEAL (V-P) SHUNT N/A 03/18/2020   Procedure: LAPAROSCOPIC REVISION VENTRICULAR-PERITONEAL (V-P) SHUNT;  Surgeon: Fritzi Mandes, MD;  Location: [facility] OR;  Service: General;  Laterality: N/A;   RADIOLOGY WITH ANESTHESIA N/A 01/30/2020   Procedure: IR WITH ANESTHESIA;  Surgeon: Lisbeth Renshaw, MD;  Location: [facility] OR;  Service: Radiology;  Laterality: N/A;   THYROIDECTOMY N/A 02/08/2020   Procedure: THYROIDECTOMY;  Surgeon: Serena Colonel, MD;  Location: [facility] OR;  Service: ENT;  Laterality:  N/A;   TRACHEOSTOMY TUBE PLACEMENT N/A 02/08/2020   Procedure: TRACHEOSTOMY;  Surgeon: Serena Colonel, MD;  Location: [facility] OR;  Service: ENT;  Laterality: N/A;   TRACHEOSTOMY TUBE PLACEMENT N/A 02/29/2020   Procedure: TRACHEOSTOMY EXCHANGE;  Surgeon: Serena Colonel, MD;  Location: [facility] OR;  Service: ENT;  Laterality: N/A;   VENTRICULOPERITONEAL SHUNT N/A 03/18/2020   Procedure: RIGHT SHUNT INSERTION VENTRICULAR-PERITONEAL/ BURR HOLE Evacuation of Subdural Hematoma;  Surgeon: Lisbeth Renshaw, MD;  Location: [facility] OR;  Service: Neurosurgery;  Laterality: N/A;    Social History: Social History   Socioeconomic History   Marital status: Single    Spouse name: Not on file   Number of children: Not on file   Years of education: Not on file   Highest education level: Not on file  Occupational  History   Not on file  Tobacco Use   Smoking status: Former   Smokeless tobacco: Never  Vaping Use   Vaping status: Never Used  Substance and Sexual Activity   Alcohol use: Not Currently   Drug use: Not Currently   Sexual activity: Not Currently  Other Topics Concern   Not on file  Social History Narrative   Not on file   Social Determinants of Health   Financial Resource Strain: Not on file  Food Insecurity: Not on file  Transportation Needs: Not on file  Physical Activity: Not on file  Stress: Not on file  Social Connections: Not on file  Intimate Partner Violence: Not on file    Family History: Family History  Problem Relation Age of Onset   Breast cancer Sister    Ovarian cancer Sister        dx 30s,d. 30s   Brain cancer Daughter        dx 20s    Current Medications:  Current Outpatient Medications:    acetaminophen (TYLENOL) 325 MG tablet, Place 2 tablets (650 mg total) into feeding tube every 6 (six) hours as needed for mild pain., Disp: , Rfl:    ALPRAZolam (XANAX) 0.5 MG tablet, Take 0.5 mg by mouth 3 (three) times daily as needed., Disp: , Rfl:    amLODipine (NORVASC) 5 MG tablet, Place 1 tablet (5 mg total) into feeding tube daily., Disp: , Rfl:    anastrozole (ARIMIDEX) 1 MG tablet, Take 1 tablet (1 mg total) by mouth daily., Disp: 30 tablet, Rfl: 6   chlorhexidine (PERIDEX) 0.12 % solution, 15 mLs by Mouth Rinse route 2 (two) times daily., Disp: 120 mL, Rfl: 0   DULoxetine (CYMBALTA) 20 MG capsule, Take 20 mg by mouth daily., Disp: , Rfl:    fluticasone (FLONASE) 50 MCG/ACT nasal spray, Place 1 spray into both nostrils 2 (two) times daily., Disp: , Rfl: 2   glycopyrrolate (ROBINUL) 1 MG tablet, Take 1 tablet (1 mg total) by mouth 2 (two) times daily. (Patient taking differently: Place 1 mg into feeding tube 2 (two) times daily.), Disp: , Rfl:    guaiFENesin (ROBITUSSIN) 100 MG/5ML liquid, Place 10 mLs into feeding tube every 8 (eight) hours., Disp: 120 mL,  Rfl: 0   ipratropium-albuterol (DUONEB) 0.5-2.5 (3) MG/3ML SOLN, Take 3 mLs by nebulization every 4 (four) hours as needed., Disp: 360 mL, Rfl:    levETIRAcetam (KEPPRA) 100 MG/ML solution, Place 7.5 mLs (750 mg total) into feeding tube 2 (two) times daily., Disp: 473 mL, Rfl: 12   levothyroxine (SYNTHROID) 100 MCG tablet, Place 1 tablet (100 mcg total) into feeding tube daily at 6 (six) AM., Disp:  30 tablet, Rfl: 3   Nutritional Supplements (FEEDING SUPPLEMENT, OSMOLITE 1.2 CAL,) LIQD, Place 355 mLs into feeding tube 5 (five) times daily., Disp: , Rfl: 0   polyethylene glycol (MIRALAX / GLYCOLAX) 17 g packet, Place 17 g into feeding tube 2 (two) times daily., Disp: 14 each, Rfl: 0   QUEtiapine (SEROQUEL) 50 MG tablet, Place 1 tablet (50 mg total) into feeding tube 2 (two) times daily., Disp: 60 tablet, Rfl: 0   scopolamine (TRANSDERM-SCOP) 1 MG/3DAYS, Place 1 patch onto the skin every 3 (three) days., Disp: , Rfl:    senna (SENOKOT) 8.6 MG TABS tablet, Place 1 tablet (8.6 mg total) into feeding tube daily., Disp: 120 tablet, Rfl: 0   Water For Irrigation, Sterile (FREE WATER) SOLN, Place 150 mLs into feeding tube every 6 (six) hours., Disp: , Rfl:    Allergies: No Known Allergies  REVIEW OF SYSTEMS:   Review of Systems  Constitutional:  Negative for chills, fatigue and fever.  HENT:   Negative for lump/mass, mouth sores, nosebleeds, sore throat and trouble swallowing.   Eyes:  Negative for eye problems.  Respiratory:  Negative for cough and shortness of breath.   Cardiovascular:  Negative for chest pain, leg swelling and palpitations.  Gastrointestinal:  Negative for abdominal pain, constipation, diarrhea, nausea and vomiting.  Genitourinary:  Negative for bladder incontinence, difficulty urinating, dysuria, frequency, hematuria and nocturia.   Musculoskeletal:  Negative for arthralgias, back pain, flank pain, myalgias and neck pain.  Skin:  Negative for itching and rash.  Neurological:   Negative for dizziness, headaches and numbness.  Hematological:  Does not bruise/bleed easily.  Psychiatric/Behavioral:  Negative for depression, sleep disturbance and suicidal ideas. The patient is not nervous/anxious.   All other systems reviewed and are negative.    VITALS:   There were no vitals taken for this visit.  Wt Readings from Last 3 Encounters:  09/20/22 164 lb 10.9 oz (74.7 kg)  08/05/22 164 lb 11.2 oz (74.7 kg)  06/10/22 161 lb 6.4 oz (73.2 kg)    There is no height or weight on file to calculate BMI.  Performance status (ECOG): 1 - Symptomatic but completely ambulatory  PHYSICAL EXAM:   Physical Exam Vitals and nursing note reviewed. Exam conducted with a chaperone present.  Constitutional:      Appearance: Normal appearance.  Cardiovascular:     Rate and Rhythm: Normal rate and regular rhythm.     Pulses: Normal pulses.     Heart sounds: Normal heart sounds.  Pulmonary:     Effort: Pulmonary effort is normal.     Breath sounds: Normal breath sounds.  Abdominal:     Palpations: Abdomen is soft. There is no hepatomegaly, splenomegaly or mass.     Tenderness: There is no abdominal tenderness.  Musculoskeletal:     Right lower leg: No edema.     Left lower leg: No edema.  Lymphadenopathy:     Cervical: No cervical adenopathy.     Right cervical: No superficial, deep or posterior cervical adenopathy.    Left cervical: No superficial, deep or posterior cervical adenopathy.     Upper Body:     Right upper body: No supraclavicular or axillary adenopathy.     Left upper body: No supraclavicular or axillary adenopathy.  Neurological:     General: No focal deficit present.     Mental Status: She is alert and oriented to person, place, and time.  Psychiatric:        Mood  and Affect: Mood normal.        Behavior: Behavior normal.     LABS:      Latest Ref Rng & Units 10/29/2022   12:15 PM 08/26/2022    8:40 AM 06/10/2022   11:50 AM  CBC  WBC 4.0 - 10.5  K/uL 5.4  5.0  4.8   Hemoglobin 12.0 - 15.0 g/dL 40.9  81.1  91.4   Hematocrit 36.0 - 46.0 % 41.9  43.2  40.8   Platelets 150 - 400 K/uL 159  139  144       Latest Ref Rng & Units 10/29/2022   12:15 PM 08/26/2022    8:40 AM 06/10/2022   11:50 AM  CMP  Glucose 70 - 99 mg/dL 782  956  94   BUN 8 - 23 mg/dL 20  21  29    Creatinine 0.44 - 1.00 mg/dL 2.13  0.86  5.78   Sodium 135 - 145 mmol/L 138  139  140   Potassium 3.5 - 5.1 mmol/L 3.4  3.7  4.0   Chloride 98 - 111 mmol/L 102  102  102   CO2 22 - 32 mmol/L 28  28  29    Calcium 8.9 - 10.3 mg/dL 9.2  9.5  9.4   Total Protein 6.5 - 8.1 g/dL 7.4  8.0  7.8   Total Bilirubin 0.3 - 1.2 mg/dL 0.3  0.6  0.3   Alkaline Phos 38 - 126 U/L 62  73  72   AST 15 - 41 U/L 30  24  22    ALT 0 - 44 U/L 14  19  23       No results found for: "CEA1", "CEA" / No results found for: "CEA1", "CEA" No results found for: "PSA1" No results found for: "ION629" No results found for: "CAN125"  No results found for: "TOTALPROTELP", "ALBUMINELP", "A1GS", "A2GS", "BETS", "BETA2SER", "GAMS", "MSPIKE", "SPEI" Lab Results  Component Value Date   TIBC 258 08/08/2020   FERRITIN 134 08/08/2020   IRONPCTSAT 28 08/08/2020   No results found for: "LDH"   STUDIES:   No results found.

## 2022-11-04 ENCOUNTER — Inpatient Hospital Stay: Payer: Medicaid Other | Admitting: Hematology

## 2022-11-15 ENCOUNTER — Encounter: Payer: Self-pay | Admitting: *Deleted

## 2022-11-16 ENCOUNTER — Other Ambulatory Visit: Payer: Self-pay

## 2022-11-25 ENCOUNTER — Other Ambulatory Visit: Payer: Self-pay

## 2022-11-25 DIAGNOSIS — C50912 Malignant neoplasm of unspecified site of left female breast: Secondary | ICD-10-CM

## 2022-11-26 ENCOUNTER — Inpatient Hospital Stay: Payer: Medicaid Other

## 2022-11-26 ENCOUNTER — Ambulatory Visit: Payer: Medicaid Other

## 2022-11-26 VITALS — BP 122/78 | HR 95 | Temp 98.4°F | Resp 20

## 2022-11-26 DIAGNOSIS — C50412 Malignant neoplasm of upper-outer quadrant of left female breast: Secondary | ICD-10-CM | POA: Diagnosis not present

## 2022-11-26 DIAGNOSIS — C50912 Malignant neoplasm of unspecified site of left female breast: Secondary | ICD-10-CM

## 2022-11-26 LAB — CBC WITH DIFFERENTIAL/PLATELET
Abs Immature Granulocytes: 0.01 10*3/uL (ref 0.00–0.07)
Basophils Absolute: 0 10*3/uL (ref 0.0–0.1)
Basophils Relative: 0 %
Eosinophils Absolute: 0.1 10*3/uL (ref 0.0–0.5)
Eosinophils Relative: 1 %
HCT: 40.6 % (ref 36.0–46.0)
Hemoglobin: 12.8 g/dL (ref 12.0–15.0)
Immature Granulocytes: 0 %
Lymphocytes Relative: 22 %
Lymphs Abs: 1.3 10*3/uL (ref 0.7–4.0)
MCH: 25.5 pg — ABNORMAL LOW (ref 26.0–34.0)
MCHC: 31.5 g/dL (ref 30.0–36.0)
MCV: 81 fL (ref 80.0–100.0)
Monocytes Absolute: 0.5 10*3/uL (ref 0.1–1.0)
Monocytes Relative: 8 %
Neutro Abs: 3.9 10*3/uL (ref 1.7–7.7)
Neutrophils Relative %: 69 %
Platelets: 219 10*3/uL (ref 150–400)
RBC: 5.01 MIL/uL (ref 3.87–5.11)
RDW: 14.6 % (ref 11.5–15.5)
WBC: 5.8 10*3/uL (ref 4.0–10.5)
nRBC: 0 % (ref 0.0–0.2)

## 2022-11-26 LAB — COMPREHENSIVE METABOLIC PANEL
ALT: 14 U/L (ref 0–44)
AST: 31 U/L (ref 15–41)
Albumin: 3.6 g/dL (ref 3.5–5.0)
Alkaline Phosphatase: 61 U/L (ref 38–126)
Anion gap: 8 (ref 5–15)
BUN: 17 mg/dL (ref 8–23)
CO2: 29 mmol/L (ref 22–32)
Calcium: 8.9 mg/dL (ref 8.9–10.3)
Chloride: 99 mmol/L (ref 98–111)
Creatinine, Ser: 0.64 mg/dL (ref 0.44–1.00)
GFR, Estimated: >60 mL/min (ref >60)
Glucose, Bld: 192 mg/dL — ABNORMAL HIGH (ref 70–99)
Potassium: 3.7 mmol/L (ref 3.5–5.1)
Sodium: 136 mmol/L (ref 135–145)
Total Bilirubin: 0.5 mg/dL (ref 0.3–1.2)
Total Protein: 7.8 g/dL (ref 6.5–8.1)

## 2022-11-26 MED ORDER — FULVESTRANT 250 MG/5ML IM SOSY
500.0000 mg | PREFILLED_SYRINGE | Freq: Once | INTRAMUSCULAR | Status: AC
  Administered 2022-11-26: 500 mg via INTRAMUSCULAR

## 2022-11-26 NOTE — Progress Notes (Signed)
Faslodex injections given per orders. Patient tolerated it well without problems. Vitals stable and discharged home from clinic ambulatory. Follow up as scheduled.

## 2022-11-26 NOTE — Patient Instructions (Signed)
[facility]  Discharge Instructions: Thank you for choosing [facility] to provide your oncology and hematology care.  If you have a lab appointment with the Cancer Center - please note that after April 8th, 2024, all labs will be drawn in the cancer center.  You do not have to check in or register with the main entrance as you have in the past but will complete your check-in in the cancer center.  Wear comfortable clothing and clothing appropriate for easy access to any Portacath or PICC line.   We strive to give you quality time with your provider. You may need to reschedule your appointment if you arrive late (15 or more minutes).  Arriving late affects you and other patients whose appointments are after yours.  Also, if you miss three or more appointments without notifying the office, you may be dismissed from the clinic at the provider's discretion.      For prescription refill requests, have your pharmacy contact our office and allow 72 hours for refills to be completed.    Today you received the following, faslodex injections      To help prevent nausea and vomiting after your treatment, we encourage you to take your nausea medication as directed.  BELOW ARE SYMPTOMS THAT SHOULD BE REPORTED IMMEDIATELY: *FEVER GREATER THAN 100.4 F (38 C) OR HIGHER *CHILLS OR SWEATING *NAUSEA AND VOMITING THAT IS NOT CONTROLLED WITH YOUR NAUSEA MEDICATION *UNUSUAL SHORTNESS OF BREATH *UNUSUAL BRUISING OR BLEEDING *URINARY PROBLEMS (pain or burning when urinating, or frequent urination) *BOWEL PROBLEMS (unusual diarrhea, constipation, pain near the anus) TENDERNESS IN MOUTH AND THROAT WITH OR WITHOUT PRESENCE OF ULCERS (sore throat, sores in mouth, or a toothache) UNUSUAL RASH, SWELLING OR PAIN  UNUSUAL VAGINAL DISCHARGE OR ITCHING   Items with * indicate a potential emergency and should be followed up as soon as possible or go to the Emergency Department if any  problems should occur.  Please show the CHEMOTHERAPY ALERT CARD or IMMUNOTHERAPY ALERT CARD at check-in to the Emergency Department and triage nurse.  Should you have questions after your visit or need to cancel or reschedule your appointment, please contact [facility] [phone]  and follow the prompts.  Office hours are 8:00 a.m. to 4:30 p.m. Monday - Friday. Please note that voicemails left after 4:00 p.m. may not be returned until the following business day.  We are closed weekends and major holidays. You have access to a nurse at all times for urgent questions. Please call the main number to the clinic [phone] and follow the prompts.  For any non-urgent questions, you may also contact your provider using MyChart. We now offer e-Visits for anyone 69 and older to request care online for non-urgent symptoms. For details visit [email or web address].   Also download the MyChart app! Go to the app store, search "MyChart", open the app, select [place], and log in with your MyChart username and password.

## 2022-11-29 ENCOUNTER — Emergency Department (HOSPITAL_COMMUNITY): Payer: Medicaid Other

## 2022-11-29 ENCOUNTER — Emergency Department (HOSPITAL_COMMUNITY)
Admission: EM | Admit: 2022-11-29 | Discharge: 2022-11-29 | Disposition: A | Discharge status: Home / Self Care | Payer: Medicaid Other | Attending: Emergency Medicine | Admitting: Emergency Medicine

## 2022-11-29 ENCOUNTER — Encounter (HOSPITAL_COMMUNITY): Payer: Self-pay

## 2022-11-29 ENCOUNTER — Other Ambulatory Visit: Payer: Self-pay

## 2022-11-29 DIAGNOSIS — R531 Weakness: Secondary | ICD-10-CM | POA: Insufficient documentation

## 2022-11-29 DIAGNOSIS — R140 Abdominal distension (gaseous): Secondary | ICD-10-CM | POA: Insufficient documentation

## 2022-11-29 DIAGNOSIS — Z79899 Other long term (current) drug therapy: Secondary | ICD-10-CM | POA: Insufficient documentation

## 2022-11-29 DIAGNOSIS — R7309 Other abnormal glucose: Secondary | ICD-10-CM | POA: Insufficient documentation

## 2022-11-29 DIAGNOSIS — I10 Essential (primary) hypertension: Secondary | ICD-10-CM | POA: Insufficient documentation

## 2022-11-29 DIAGNOSIS — W19XXXA Unspecified fall, initial encounter: Secondary | ICD-10-CM | POA: Diagnosis not present

## 2022-11-29 LAB — BASIC METABOLIC PANEL
Anion gap: 7 (ref 5–15)
BUN: 18 mg/dL (ref 8–23)
CO2: 30 mmol/L (ref 22–32)
Calcium: 8.7 mg/dL — ABNORMAL LOW (ref 8.9–10.3)
Chloride: 99 mmol/L (ref 98–111)
Creatinine, Ser: 0.77 mg/dL (ref 0.44–1.00)
GFR, Estimated: >60 mL/min (ref >60)
Glucose, Bld: 212 mg/dL — ABNORMAL HIGH (ref 70–99)
Potassium: 3.9 mmol/L (ref 3.5–5.1)
Sodium: 136 mmol/L (ref 135–145)

## 2022-11-29 LAB — CBC WITH DIFFERENTIAL/PLATELET
Abs Immature Granulocytes: 0.02 10*3/uL (ref 0.00–0.07)
Basophils Absolute: 0 10*3/uL (ref 0.0–0.1)
Basophils Relative: 0 %
Eosinophils Absolute: 0.1 10*3/uL (ref 0.0–0.5)
Eosinophils Relative: 1 %
HCT: 40.6 % (ref 36.0–46.0)
Hemoglobin: 12.6 g/dL (ref 12.0–15.0)
Immature Granulocytes: 0 %
Lymphocytes Relative: 16 %
Lymphs Abs: 1.1 10*3/uL (ref 0.7–4.0)
MCH: 25.3 pg — ABNORMAL LOW (ref 26.0–34.0)
MCHC: 31 g/dL (ref 30.0–36.0)
MCV: 81.4 fL (ref 80.0–100.0)
Monocytes Absolute: 0.5 10*3/uL (ref 0.1–1.0)
Monocytes Relative: 7 %
Neutro Abs: 5 10*3/uL (ref 1.7–7.7)
Neutrophils Relative %: 76 %
Platelets: 229 10*3/uL (ref 150–400)
RBC: 4.99 MIL/uL (ref 3.87–5.11)
RDW: 14.6 % (ref 11.5–15.5)
WBC: 6.7 10*3/uL (ref 4.0–10.5)
nRBC: 0 % (ref 0.0–0.2)

## 2022-11-29 LAB — URINALYSIS, ROUTINE W REFLEX MICROSCOPIC
Bilirubin Urine: NEGATIVE
Glucose, UA: NEGATIVE mg/dL
Hgb urine dipstick: NEGATIVE
Ketones, ur: NEGATIVE mg/dL
Leukocytes,Ua: NEGATIVE
Nitrite: NEGATIVE
Protein, ur: NEGATIVE mg/dL
Specific Gravity, Urine: 1.01 (ref 1.005–1.030)
pH: 6 (ref 5.0–8.0)

## 2022-11-29 NOTE — ED Triage Notes (Signed)
"  Stacy Moran called out due to patient being altered and falling several times lately. They were concerned she may have a UTI. Patient is A&O x 4. States she falls because she is unsteady but slides to ground and hasn't actually fallen to have an injury" per EMS

## 2022-11-29 NOTE — ED Provider Notes (Signed)
EMERGENCY DEPARTMENT AT [facility] Provider Note   CSN: [id number] Arrival date & time: 11/29/22  6962     History {Add pertinent medical, surgical, social history, OB history to HPI:1} Chief Complaint  Patient presents with  . Dysuria    Stacy Moran is a 67 y.o. female   The history is provided by the patient and the nursing home.       Home Medications Prior to Admission medications   Medication Sig Start Date End Date Taking? Authorizing Provider  acetaminophen (TYLENOL) 325 MG tablet Place 2 tablets (650 mg total) into feeding tube every 6 (six) hours as needed for mild pain. 05/20/20   Angiulli, Mcarthur Rossetti, PA-C  ALPRAZolam Prudy Feeler) 0.5 MG tablet Take 0.5 mg by mouth 3 (three) times daily as needed. 04/23/21   [provider]  amLODipine (NORVASC) 5 MG tablet Place 1 tablet (5 mg total) into feeding tube daily. 02/19/21   Lynn Ito, MD  anastrozole (ARIMIDEX) 1 MG tablet Take 1 tablet (1 mg total) by mouth daily. 06/29/21   Doreatha Massed, MD  chlorhexidine (PERIDEX) 0.12 % solution 15 mLs by Mouth Rinse route 2 (two) times daily. 10/07/20   Jackelyn Poling, DO  DULoxetine (CYMBALTA) 20 MG capsule Take 20 mg by mouth daily. 05/20/21   [provider]  fluticasone (FLONASE) 50 MCG/ACT nasal spray Place 1 spray into both nostrils 2 (two) times daily. 02/18/21   Lynn Ito, MD  glycopyrrolate (ROBINUL) 1 MG tablet Take 1 tablet (1 mg total) by mouth 2 (two) times daily. Patient taking differently: Place 1 mg into feeding tube 2 (two) times daily. 02/18/21   Lynn Ito, MD  guaiFENesin (ROBITUSSIN) 100 MG/5ML liquid Place 10 mLs into feeding tube every 8 (eight) hours. 02/18/21   Lynn Ito, MD  ipratropium-albuterol (DUONEB) 0.5-2.5 (3) MG/3ML SOLN Take 3 mLs by nebulization every 4 (four) hours as needed. 12/21/20   Osvaldo Shipper, MD  levETIRAcetam (KEPPRA) 100 MG/ML solution Place 7.5 mLs (750 mg total) into feeding tube 2 (two)  times daily. 10/07/20   Jackelyn Poling, DO  levothyroxine (SYNTHROID) 100 MCG tablet Place 1 tablet (100 mcg total) into feeding tube daily at 6 (six) AM. 07/16/20   Marcine Matar, MD  Nutritional Supplements (FEEDING SUPPLEMENT, OSMOLITE 1.2 CAL,) LIQD Place 355 mLs into feeding tube 5 (five) times daily. 02/18/21   Lynn Ito, MD  polyethylene glycol (MIRALAX / GLYCOLAX) 17 g packet Place 17 g into feeding tube 2 (two) times daily. 10/07/20   Jackelyn Poling, DO  QUEtiapine (SEROQUEL) 50 MG tablet Place 1 tablet (50 mg total) into feeding tube 2 (two) times daily. 12/21/20 09/27/23  Osvaldo Shipper, MD  scopolamine (TRANSDERM-SCOP) 1 MG/3DAYS Place 1 patch onto the skin every 3 (three) days.    [provider]  senna (SENOKOT) 8.6 MG TABS tablet Place 1 tablet (8.6 mg total) into feeding tube daily. 10/08/20   Jackelyn Poling, DO  Water For Irrigation, Sterile (FREE WATER) SOLN Place 150 mLs into feeding tube every 6 (six) hours. 02/18/21   Lynn Ito, MD  hydrALAZINE (APRESOLINE) 50 MG tablet Place 1 tablet (50 mg total) into feeding tube every 8 (eight) hours. 08/09/20 10/07/20  Marcine Matar, MD      Allergies    Patient has no known allergies.    Review of Systems   Review of Systems  Physical Exam Updated Vital Signs BP 132/82 (BP Location: Left Arm)   Pulse 95   Temp  98.2 F (36.8 C) (Oral)   Resp 16   Ht 5\' 6"  (1.676 m)   Wt 72.6 kg   SpO2 98%   BMI 25.82 kg/m  Physical Exam  ED Results / Procedures / Treatments   Labs (all labs ordered are listed, but only abnormal results are displayed) Labs Reviewed  CBC WITH DIFFERENTIAL/PLATELET - Abnormal; Notable for the following components:      Result Value   MCH 25.3 (*)    All other components within normal limits  BASIC METABOLIC PANEL - Abnormal; Notable for the following components:   Glucose, Bld 212 (*)    Calcium 8.7 (*)    All other components within normal limits  URINE CULTURE  URINALYSIS, ROUTINE W  REFLEX MICROSCOPIC    EKG None  Radiology No results found.  Procedures Procedures  {Document cardiac monitor, telemetry assessment procedure when appropriate:1}  Medications Ordered in ED Medications - No data to display  ED Course/ Medical Decision Making/ A&P   {   Click here for ABCD2, HEART and other calculatorsREFRESH Note before signing :1}                              Medical Decision Making Pt presenting with increased generalized weakness and several controlled slides to the ground with ambulation (uses a rolling walker at baseline).  Denies any injury, no pain head, neck, extremities, back.  Increased pungent urine, concern for uti. 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      11:57 AM dg now at bedside,  states mother's abd has been distended for the past month, pt denies constipation and abd pain.  Will obtain 2 view abd looking for constipation or obstruction.  Abd exam is benign, no guarding or mass.  PEG tube clean.   Amount and/or Complexity of Data Reviewed Labs: ordered.     {Document critical care time when appropriate:1} {Document review of labs and clinical decision tools ie heart score, Chads2Vasc2 etc:1}  {Document your independent review of radiology images, and any outside records:1} {Document your discussion with family members, caretakers, and with consultants:1} {Document social determinants of health affecting pt's care:1} {Document your decision making why or why not admission, treatments were needed:1} Final Clinical Impression(s) / ED Diagnoses Final diagnoses:  None    Rx / DC Orders ED Discharge Orders     None

## 2022-11-29 NOTE — Discharge Instructions (Addendum)
Your lab tests including your urinalysis today are normal with no sign of infection or electrolyte abnormalities.  I do recommend using caution with ambulation, including assistance by the nursing home staff when ambulating to avoid further falls.

## 2022-11-30 ENCOUNTER — Inpatient Hospital Stay (HOSPITAL_COMMUNITY)
Admission: EM | Admit: 2022-11-30 | Discharge: 2022-12-08 | DRG: 177 | Disposition: A | Discharge status: Hospice | Payer: Medicaid Other | Source: Skilled Nursing Facility | Attending: Internal Medicine | Admitting: Internal Medicine

## 2022-11-30 ENCOUNTER — Emergency Department (HOSPITAL_COMMUNITY): Payer: Medicaid Other

## 2022-11-30 ENCOUNTER — Encounter (HOSPITAL_COMMUNITY): Payer: Self-pay

## 2022-11-30 ENCOUNTER — Other Ambulatory Visit: Payer: Self-pay

## 2022-11-30 DIAGNOSIS — Z1152 Encounter for screening for COVID-19: Secondary | ICD-10-CM

## 2022-11-30 DIAGNOSIS — J189 Pneumonia, unspecified organism: Secondary | ICD-10-CM | POA: Diagnosis not present

## 2022-11-30 DIAGNOSIS — Z170 Estrogen receptor positive status [ER+]: Secondary | ICD-10-CM

## 2022-11-30 DIAGNOSIS — Z66 Do not resuscitate: Secondary | ICD-10-CM | POA: Diagnosis present

## 2022-11-30 DIAGNOSIS — Z7189 Other specified counseling: Secondary | ICD-10-CM

## 2022-11-30 DIAGNOSIS — Z87891 Personal history of nicotine dependence: Secondary | ICD-10-CM

## 2022-11-30 DIAGNOSIS — I69291 Dysphagia following other nontraumatic intracranial hemorrhage: Secondary | ICD-10-CM

## 2022-11-30 DIAGNOSIS — R7303 Prediabetes: Secondary | ICD-10-CM | POA: Diagnosis present

## 2022-11-30 DIAGNOSIS — Z515 Encounter for palliative care: Secondary | ICD-10-CM

## 2022-11-30 DIAGNOSIS — N390 Urinary tract infection, site not specified: Secondary | ICD-10-CM | POA: Diagnosis present

## 2022-11-30 DIAGNOSIS — Z970 Presence of artificial eye: Secondary | ICD-10-CM

## 2022-11-30 DIAGNOSIS — Z6825 Body mass index (BMI) 25.0-25.9, adult: Secondary | ICD-10-CM

## 2022-11-30 DIAGNOSIS — G40909 Epilepsy, unspecified, not intractable, without status epilepticus: Secondary | ICD-10-CM | POA: Diagnosis present

## 2022-11-30 DIAGNOSIS — Z79899 Other long term (current) drug therapy: Secondary | ICD-10-CM

## 2022-11-30 DIAGNOSIS — I609 Nontraumatic subarachnoid hemorrhage, unspecified: Secondary | ICD-10-CM

## 2022-11-30 DIAGNOSIS — Z930 Tracheostomy status: Secondary | ICD-10-CM | POA: Diagnosis not present

## 2022-11-30 DIAGNOSIS — R590 Localized enlarged lymph nodes: Secondary | ICD-10-CM | POA: Diagnosis present

## 2022-11-30 DIAGNOSIS — Z8041 Family history of malignant neoplasm of ovary: Secondary | ICD-10-CM

## 2022-11-30 DIAGNOSIS — J181 Lobar pneumonia, unspecified organism: Secondary | ICD-10-CM | POA: Diagnosis present

## 2022-11-30 DIAGNOSIS — B964 Proteus (mirabilis) (morganii) as the cause of diseases classified elsewhere: Secondary | ICD-10-CM | POA: Diagnosis present

## 2022-11-30 DIAGNOSIS — J9611 Chronic respiratory failure with hypoxia: Secondary | ICD-10-CM

## 2022-11-30 DIAGNOSIS — J3802 Paralysis of vocal cords and larynx, bilateral: Secondary | ICD-10-CM | POA: Diagnosis present

## 2022-11-30 DIAGNOSIS — J690 Pneumonitis due to inhalation of food and vomit: Principal | ICD-10-CM | POA: Diagnosis present

## 2022-11-30 DIAGNOSIS — Z982 Presence of cerebrospinal fluid drainage device: Secondary | ICD-10-CM

## 2022-11-30 DIAGNOSIS — I69891 Dysphagia following other cerebrovascular disease: Secondary | ICD-10-CM

## 2022-11-30 DIAGNOSIS — R0689 Other abnormalities of breathing: Secondary | ICD-10-CM | POA: Diagnosis present

## 2022-11-30 DIAGNOSIS — D259 Leiomyoma of uterus, unspecified: Secondary | ICD-10-CM | POA: Diagnosis present

## 2022-11-30 DIAGNOSIS — Z803 Family history of malignant neoplasm of breast: Secondary | ICD-10-CM

## 2022-11-30 DIAGNOSIS — Z79811 Long term (current) use of aromatase inhibitors: Secondary | ICD-10-CM

## 2022-11-30 DIAGNOSIS — J9621 Acute and chronic respiratory failure with hypoxia: Secondary | ICD-10-CM | POA: Diagnosis present

## 2022-11-30 DIAGNOSIS — R3981 Functional urinary incontinence: Secondary | ICD-10-CM | POA: Diagnosis present

## 2022-11-30 DIAGNOSIS — C50912 Malignant neoplasm of unspecified site of left female breast: Secondary | ICD-10-CM | POA: Diagnosis present

## 2022-11-30 DIAGNOSIS — Z2989 Encounter for other specified prophylactic measures: Secondary | ICD-10-CM

## 2022-11-30 DIAGNOSIS — Z853 Personal history of malignant neoplasm of breast: Secondary | ICD-10-CM

## 2022-11-30 DIAGNOSIS — C786 Secondary malignant neoplasm of retroperitoneum and peritoneum: Secondary | ICD-10-CM | POA: Diagnosis present

## 2022-11-30 DIAGNOSIS — F39 Unspecified mood [affective] disorder: Secondary | ICD-10-CM | POA: Diagnosis present

## 2022-11-30 DIAGNOSIS — E440 Moderate protein-calorie malnutrition: Secondary | ICD-10-CM | POA: Diagnosis present

## 2022-11-30 DIAGNOSIS — G9341 Metabolic encephalopathy: Secondary | ICD-10-CM | POA: Diagnosis not present

## 2022-11-30 DIAGNOSIS — Z808 Family history of malignant neoplasm of other organs or systems: Secondary | ICD-10-CM

## 2022-11-30 DIAGNOSIS — C771 Secondary and unspecified malignant neoplasm of intrathoracic lymph nodes: Secondary | ICD-10-CM | POA: Diagnosis present

## 2022-11-30 DIAGNOSIS — E890 Postprocedural hypothyroidism: Secondary | ICD-10-CM | POA: Diagnosis present

## 2022-11-30 DIAGNOSIS — R180 Malignant ascites: Secondary | ICD-10-CM | POA: Diagnosis present

## 2022-11-30 DIAGNOSIS — R531 Weakness: Secondary | ICD-10-CM

## 2022-11-30 DIAGNOSIS — I607 Nontraumatic subarachnoid hemorrhage from unspecified intracranial artery: Secondary | ICD-10-CM | POA: Diagnosis present

## 2022-11-30 DIAGNOSIS — R1310 Dysphagia, unspecified: Secondary | ICD-10-CM | POA: Diagnosis present

## 2022-11-30 DIAGNOSIS — Z931 Gastrostomy status: Secondary | ICD-10-CM

## 2022-11-30 DIAGNOSIS — Z751 Person awaiting admission to adequate facility elsewhere: Secondary | ICD-10-CM

## 2022-11-30 DIAGNOSIS — I10 Essential (primary) hypertension: Secondary | ICD-10-CM | POA: Diagnosis present

## 2022-11-30 LAB — CBC WITH DIFFERENTIAL/PLATELET
Abs Immature Granulocytes: 0.02 10*3/uL (ref 0.00–0.07)
Basophils Absolute: 0 10*3/uL (ref 0.0–0.1)
Basophils Relative: 0 %
Eosinophils Absolute: 0.1 10*3/uL (ref 0.0–0.5)
Eosinophils Relative: 1 %
HCT: 41.8 % (ref 36.0–46.0)
Hemoglobin: 13 g/dL (ref 12.0–15.0)
Immature Granulocytes: 0 %
Lymphocytes Relative: 13 %
Lymphs Abs: 1 10*3/uL (ref 0.7–4.0)
MCH: 25.2 pg — ABNORMAL LOW (ref 26.0–34.0)
MCHC: 31.1 g/dL (ref 30.0–36.0)
MCV: 81 fL (ref 80.0–100.0)
Monocytes Absolute: 0.5 10*3/uL (ref 0.1–1.0)
Monocytes Relative: 6 %
Neutro Abs: 6.2 10*3/uL (ref 1.7–7.7)
Neutrophils Relative %: 80 %
Platelets: 225 10*3/uL (ref 150–400)
RBC: 5.16 MIL/uL — ABNORMAL HIGH (ref 3.87–5.11)
RDW: 14.5 % (ref 11.5–15.5)
WBC: 7.8 10*3/uL (ref 4.0–10.5)
nRBC: 0 % (ref 0.0–0.2)

## 2022-11-30 LAB — URINALYSIS, W/ REFLEX TO CULTURE (INFECTION SUSPECTED)
Bilirubin Urine: NEGATIVE
Glucose, UA: NEGATIVE mg/dL
Ketones, ur: NEGATIVE mg/dL
Nitrite: NEGATIVE
Protein, ur: 100 mg/dL — AB
RBC / HPF: >50 RBC/hpf (ref 0–5)
Specific Gravity, Urine: 1.024 (ref 1.005–1.030)
WBC, UA: >50 WBC/hpf (ref 0–5)
pH: 7 (ref 5.0–8.0)

## 2022-11-30 LAB — COMPREHENSIVE METABOLIC PANEL
ALT: 16 U/L (ref 0–44)
AST: 34 U/L (ref 15–41)
Albumin: 3.5 g/dL (ref 3.5–5.0)
Alkaline Phosphatase: 69 U/L (ref 38–126)
Anion gap: 9 (ref 5–15)
BUN: 18 mg/dL (ref 8–23)
CO2: 31 mmol/L (ref 22–32)
Calcium: 8.9 mg/dL (ref 8.9–10.3)
Chloride: 97 mmol/L — ABNORMAL LOW (ref 98–111)
Creatinine, Ser: 0.71 mg/dL (ref 0.44–1.00)
GFR, Estimated: >60 mL/min (ref >60)
Glucose, Bld: 206 mg/dL — ABNORMAL HIGH (ref 70–99)
Potassium: 3.6 mmol/L (ref 3.5–5.1)
Sodium: 137 mmol/L (ref 135–145)
Total Bilirubin: 0.6 mg/dL (ref 0.3–1.2)
Total Protein: 7.5 g/dL (ref 6.5–8.1)

## 2022-11-30 LAB — URINE CULTURE: Culture: NO GROWTH

## 2022-11-30 LAB — TROPONIN I (HIGH SENSITIVITY): Troponin I (High Sensitivity): 4 ng/L (ref <18)

## 2022-11-30 LAB — HEMOGLOBIN A1C
Hgb A1c MFr Bld: 6.6 % — ABNORMAL HIGH (ref 4.8–5.6)
Mean Plasma Glucose: 142.72 mg/dL

## 2022-11-30 LAB — PROCALCITONIN: Procalcitonin: 0.11 ng/mL

## 2022-11-30 LAB — LACTIC ACID, PLASMA: Lactic Acid, Venous: 1.3 mmol/L (ref 0.5–1.9)

## 2022-11-30 LAB — MRSA NEXT GEN BY PCR, NASAL: MRSA by PCR Next Gen: NOT DETECTED

## 2022-11-30 LAB — SARS CORONAVIRUS 2 BY RT PCR: SARS Coronavirus 2 by RT PCR: NEGATIVE

## 2022-11-30 MED ORDER — GUAIFENESIN 100 MG/5ML PO LIQD
10.0000 mL | Freq: Three times a day (TID) | ORAL | Status: DC
  Administered 2022-11-30 – 2022-12-07 (×19): 10 mL
  Filled 2022-11-30 (×21): qty 10

## 2022-11-30 MED ORDER — ACETAMINOPHEN 325 MG PO TABS
650.0000 mg | ORAL_TABLET | Freq: Four times a day (QID) | ORAL | Status: DC | PRN
  Filled 2022-11-30: qty 2

## 2022-11-30 MED ORDER — HYDROCODONE-ACETAMINOPHEN 5-325 MG PO TABS
1.0000 | ORAL_TABLET | Freq: Four times a day (QID) | ORAL | Status: DC | PRN
  Administered 2022-12-02: 1 via ORAL
  Filled 2022-11-30: qty 1

## 2022-11-30 MED ORDER — LEVETIRACETAM 100 MG/ML PO SOLN
750.0000 mg | Freq: Two times a day (BID) | ORAL | Status: DC
  Administered 2022-11-30 – 2022-12-07 (×13): 750 mg
  Filled 2022-11-30 (×16): qty 7.5

## 2022-11-30 MED ORDER — HEPARIN SODIUM (PORCINE) 5000 UNIT/ML IJ SOLN
5000.0000 [IU] | Freq: Three times a day (TID) | INTRAMUSCULAR | Status: DC
  Administered 2022-11-30 – 2022-12-05 (×16): 5000 [IU] via SUBCUTANEOUS
  Filled 2022-11-30 (×16): qty 1

## 2022-11-30 MED ORDER — ACETAMINOPHEN 160 MG/5ML PO SOLN
650.0000 mg | Freq: Once | ORAL | Status: AC
  Administered 2022-11-30: 650 mg
  Filled 2022-11-30: qty 20.3

## 2022-11-30 MED ORDER — SODIUM CHLORIDE 0.9 % IV SOLN
1.0000 g | INTRAVENOUS | Status: DC
  Administered 2022-12-01 – 2022-12-05 (×5): 1 g via INTRAVENOUS
  Filled 2022-11-30 (×5): qty 10

## 2022-11-30 MED ORDER — AMLODIPINE BESYLATE 5 MG PO TABS
5.0000 mg | ORAL_TABLET | Freq: Every day | ORAL | Status: DC
  Administered 2022-12-01 – 2022-12-02 (×2): 5 mg
  Filled 2022-11-30 (×2): qty 1

## 2022-11-30 MED ORDER — ONDANSETRON HCL 4 MG/2ML IJ SOLN: 4.0000 mg | Freq: Four times a day (QID) | INTRAMUSCULAR | Status: DC | PRN

## 2022-11-30 MED ORDER — ALPRAZOLAM 0.5 MG PO TABS
0.5000 mg | ORAL_TABLET | Freq: Two times a day (BID) | ORAL | Status: DC
  Administered 2022-11-30 – 2022-12-07 (×13): 0.5 mg via JEJUNOSTOMY
  Filled 2022-11-30 (×14): qty 1

## 2022-11-30 MED ORDER — ORAL CARE MOUTH RINSE: 15.0000 mL | OROMUCOSAL | Status: DC

## 2022-11-30 MED ORDER — OSMOLITE 1.2 CAL PO LIQD
355.0000 mL | Freq: Every day | ORAL | Status: DC
  Administered 2022-11-30 – 2022-12-01 (×3): 355 mL
  Filled 2022-11-30 (×20): qty 474

## 2022-11-30 MED ORDER — SENNA 8.6 MG PO TABS
1.0000 | ORAL_TABLET | Freq: Every day | ORAL | Status: DC
  Administered 2022-12-01 – 2022-12-05 (×5): 8.6 mg
  Filled 2022-11-30 (×5): qty 1

## 2022-11-30 MED ORDER — ONDANSETRON HCL 4 MG PO TABS: 4.0000 mg | ORAL_TABLET | Freq: Four times a day (QID) | ORAL | Status: DC | PRN

## 2022-11-30 MED ORDER — FREE WATER
150.0000 mL | Freq: Four times a day (QID) | Status: DC
  Administered 2022-11-30 – 2022-12-07 (×19): 150 mL

## 2022-11-30 MED ORDER — FLUTICASONE PROPIONATE 50 MCG/ACT NA SUSP
1.0000 | Freq: Two times a day (BID) | NASAL | Status: DC
  Administered 2022-11-30 – 2022-12-05 (×9): 1 via NASAL
  Filled 2022-11-30 (×2): qty 16

## 2022-11-30 MED ORDER — LACTATED RINGERS IV BOLUS
1000.0000 mL | Freq: Once | INTRAVENOUS | Status: AC
  Administered 2022-11-30: 1000 mL via INTRAVENOUS

## 2022-11-30 MED ORDER — SODIUM CHLORIDE 0.9 % IV SOLN
500.0000 mg | Freq: Once | INTRAVENOUS | Status: AC
  Administered 2022-11-30: 500 mg via INTRAVENOUS
  Filled 2022-11-30: qty 5

## 2022-11-30 MED ORDER — POLYETHYLENE GLYCOL 3350 17 G PO PACK
17.0000 g | PACK | Freq: Two times a day (BID) | ORAL | Status: DC
  Administered 2022-11-30 – 2022-12-05 (×8): 17 g
  Filled 2022-11-30 (×9): qty 1

## 2022-11-30 MED ORDER — SODIUM CHLORIDE 0.9 % IV SOLN
1.0000 g | Freq: Once | INTRAVENOUS | Status: AC
  Administered 2022-11-30: 1 g via INTRAVENOUS
  Filled 2022-11-30: qty 10

## 2022-11-30 MED ORDER — IPRATROPIUM-ALBUTEROL 0.5-2.5 (3) MG/3ML IN SOLN
3.0000 mL | RESPIRATORY_TRACT | Status: DC
  Administered 2022-11-30 – 2022-12-05 (×33): 3 mL via RESPIRATORY_TRACT
  Filled 2022-11-30 (×33): qty 3

## 2022-11-30 MED ORDER — ACETAMINOPHEN 650 MG RE SUPP: 650.0000 mg | Freq: Four times a day (QID) | RECTAL | Status: DC | PRN

## 2022-11-30 MED ORDER — HYDRALAZINE HCL 20 MG/ML IJ SOLN: 5.0000 mg | INTRAMUSCULAR | Status: DC | PRN

## 2022-11-30 MED ORDER — INFLUENZA VAC A&B SURF ANT ADJ 0.5 ML IM SUSY: 0.5000 mL | PREFILLED_SYRINGE | INTRAMUSCULAR | Status: DC

## 2022-11-30 MED ORDER — CHLORHEXIDINE GLUCONATE 0.12 % MT SOLN
15.0000 mL | Freq: Two times a day (BID) | OROMUCOSAL | Status: DC
  Administered 2022-11-30 – 2022-12-05 (×10): 15 mL via OROMUCOSAL
  Filled 2022-11-30 (×10): qty 15

## 2022-11-30 MED ORDER — IPRATROPIUM-ALBUTEROL 0.5-2.5 (3) MG/3ML IN SOLN
3.0000 mL | Freq: Once | RESPIRATORY_TRACT | Status: AC
  Administered 2022-11-30: 3 mL via RESPIRATORY_TRACT
  Filled 2022-11-30: qty 3

## 2022-11-30 MED ORDER — SCOPOLAMINE 1 MG/3DAYS TD PT72
1.0000 | MEDICATED_PATCH | TRANSDERMAL | Status: DC
  Administered 2022-12-01 – 2022-12-07 (×3): 1.5 mg via TRANSDERMAL
  Filled 2022-11-30 (×4): qty 1

## 2022-11-30 MED ORDER — SODIUM CHLORIDE 0.9 % IN NEBU
3.0000 mL | INHALATION_SOLUTION | Freq: Four times a day (QID) | RESPIRATORY_TRACT | Status: DC
  Administered 2022-11-30 – 2022-12-02 (×8): 3 mL via RESPIRATORY_TRACT
  Filled 2022-11-30 (×8): qty 3

## 2022-11-30 MED ORDER — BISACODYL 5 MG PO TBEC: 5.0000 mg | DELAYED_RELEASE_TABLET | Freq: Every day | ORAL | Status: DC | PRN

## 2022-11-30 MED ORDER — LEVOTHYROXINE SODIUM 100 MCG PO TABS
100.0000 ug | ORAL_TABLET | Freq: Every day | ORAL | Status: DC
  Administered 2022-12-01 – 2022-12-05 (×4): 100 ug
  Filled 2022-11-30 (×4): qty 1

## 2022-11-30 MED ORDER — ORAL CARE MOUTH RINSE: 15.0000 mL | OROMUCOSAL | Status: DC | PRN

## 2022-11-30 MED ORDER — LACTATED RINGERS IV SOLN: INTRAVENOUS | Status: AC

## 2022-11-30 MED ORDER — SODIUM CHLORIDE 0.9 % IV SOLN
500.0000 mg | INTRAVENOUS | Status: DC
  Administered 2022-12-01 – 2022-12-05 (×5): 500 mg via INTRAVENOUS
  Filled 2022-11-30 (×5): qty 5

## 2022-11-30 MED ORDER — PANTOPRAZOLE SODIUM 40 MG PO TBEC
40.0000 mg | DELAYED_RELEASE_TABLET | Freq: Every day | ORAL | Status: DC
  Filled 2022-11-30: qty 1

## 2022-11-30 MED ORDER — DULOXETINE HCL 20 MG PO CPEP
20.0000 mg | ORAL_CAPSULE | Freq: Every day | ORAL | Status: DC
  Administered 2022-12-01: 20 mg via ORAL
  Filled 2022-11-30 (×2): qty 1

## 2022-11-30 MED ORDER — FENTANYL CITRATE PF 50 MCG/ML IJ SOSY: 12.5000 ug | PREFILLED_SYRINGE | INTRAMUSCULAR | Status: DC | PRN

## 2022-11-30 MED ORDER — QUETIAPINE FUMARATE 25 MG PO TABS
50.0000 mg | ORAL_TABLET | Freq: Two times a day (BID) | ORAL | Status: DC
  Administered 2022-11-30 – 2022-12-07 (×13): 50 mg
  Filled 2022-11-30 (×14): qty 2

## 2022-11-30 MED ORDER — MELATONIN 3 MG PO TABS
3.0000 mg | ORAL_TABLET | Freq: Every day | ORAL | Status: DC
  Administered 2022-11-30 – 2022-12-04 (×4): 3 mg
  Filled 2022-11-30 (×4): qty 1

## 2022-11-30 MED ORDER — GLYCOPYRROLATE 1 MG PO TABS
1.0000 mg | ORAL_TABLET | Freq: Two times a day (BID) | ORAL | Status: DC
  Administered 2022-11-30 – 2022-12-07 (×13): 1 mg
  Filled 2022-11-30 (×14): qty 1

## 2022-11-30 NOTE — Plan of Care (Signed)

## 2022-11-30 NOTE — Hospital Course (Addendum)
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  At baseline she is reportedly ambulatory.  She had been experiencing generalized weakness for the past several days and there was a concern that she had a UTI.  There was no fever or chills abdominal pain or chest pain or shortness of breath.    She wears a depends at baseline.  Her G-tube was recently replaced.  All nutrition and medications are given through G-tube.  She was worked up in the ED and discharged back to [facility].  She returned to ED 11/30/2022 with fever of 101.2, persistent weakness shortness of breath emesis and nausea.  They reported that patient has had increasing tracheal secretions, wheezing and coughing.  She has not been able to ambulate which is not her baseline.  Her workup in the ED was suspicious for a pneumonia and she was started on ceftriaxone and azithromycin and admitted for further evaluation and treatment.  During the hospitalization, the patient did have nausea and vomiting initially secondary to increasing abdominal distention causing intrathoracic pressure.  She did have an aspirational event.  Her enteral feedings were discontinued temporarily.  Her oxygenation did worsen requiring increasing tracheal collar supplementation up to 15 L.  She was continued on antibiotics.  Aggressive pulmonary toilet was undertaken.  Gradually, her respiratory status improved and her oxygen demand was decreased back to her usual baseline.  The patient underwent paracentesis on 12/03/2022 removing 2.4 L of ascites.  Her  abdominal distention and pain improved.  The patient was started back on enteral feedings.  They were gradually increased up to 50 cc/h.  Unfortunately, she began having abdominal pain.  Her enteral feedings were subsequently put on hold temporarily and restarted at a lower rate.  Unfortunately, she could not tolerate reintroduction of enteral feedings despite lower rate of infusion.  Palliative medicine was consulted to assist with goals of care discussion.  Ultimately, the patient's daughter agreed that the patient's overall prognosis is poor and that we have " done everything we could do".  Medical oncology, Dr. Ellin Saba was consulted.  He did not feel the patient was a candidate for further active therapy.  He agreed with pursuing hospice. This was discussed with the patient's daughter.  Ultimately, the patient's focus of care was transitioned to comfort measures.  With assistance of TOC, residential hospice, [facility] was consulted.  After some initial hesitation on the part of the daughter, she ultimately agreed to transition the patient the patient to [facility] to continue further comfort measures care.

## 2022-11-30 NOTE — Progress Notes (Signed)
Called to patient's room to suction patient again.  Patient had coughed up copious amounts of white, thick secretions.  It covered patient's shirt.  RN and this RT changed patient and cleaned her up and placed her in a hospital gown.  IV Drain dressing changed and trach collar cleaned.  Suctioned patient again.  Breathing treatment to be given along with Sodium chloride.

## 2022-11-30 NOTE — ED Provider Notes (Signed)
[place] EMERGENCY DEPARTMENT AT [facility] Provider Note   CSN: [id number] Arrival date & time: 11/30/22  0272     History  Chief Complaint  Patient presents with   Weakness    Stacy Moran is a 67 y.o. female.  HPI 67 year old female presents with generalized weakness. Has had symptoms for a couple days. Is having increased sputum out of her trach. No fevers per patient, though EMS reported a temp of 101 in the ambulance. Has had some vomiting and diarrhea. No focal weakness, but feels diffusely weak, causing her to fall from her legs giving out. No headache, chest pain or abdominal pain. Was seen in the ED yesterday as well.  History is relatively limited as patient has a trach and is able to mouth words and nod yes/no.  Home Medications Prior to Admission medications   Medication Sig Start Date End Date Taking? Authorizing Provider  acetaminophen (TYLENOL) 325 MG tablet Place 2 tablets (650 mg total) into feeding tube every 6 (six) hours as needed for mild pain. 05/20/20   Angiulli, Mcarthur Rossetti, PA-C  ALPRAZolam Prudy Feeler) 0.5 MG tablet Take 0.5 mg by mouth 3 (three) times daily as needed. 04/23/21   [provider]  amLODipine (NORVASC) 5 MG tablet Place 1 tablet (5 mg total) into feeding tube daily. 02/19/21   Lynn Ito, MD  anastrozole (ARIMIDEX) 1 MG tablet Take 1 tablet (1 mg total) by mouth daily. 06/29/21   Doreatha Massed, MD  chlorhexidine (PERIDEX) 0.12 % solution 15 mLs by Mouth Rinse route 2 (two) times daily. 10/07/20   Jackelyn Poling, DO  DULoxetine (CYMBALTA) 20 MG capsule Take 20 mg by mouth daily. 05/20/21   [provider]  fluticasone (FLONASE) 50 MCG/ACT nasal spray Place 1 spray into both nostrils 2 (two) times daily. 02/18/21   Lynn Ito, MD  glycopyrrolate (ROBINUL) 1 MG tablet Take 1 tablet (1 mg total) by mouth 2 (two) times daily. Patient taking differently: Place 1 mg into feeding tube 2 (two) times daily. 02/18/21   Lynn Ito, MD  guaiFENesin (ROBITUSSIN) 100 MG/5ML liquid Place 10 mLs into feeding tube every 8 (eight) hours. 02/18/21   Lynn Ito, MD  ipratropium-albuterol (DUONEB) 0.5-2.5 (3) MG/3ML SOLN Take 3 mLs by nebulization every 4 (four) hours as needed. 12/21/20   Osvaldo Shipper, MD  levETIRAcetam (KEPPRA) 100 MG/ML solution Place 7.5 mLs (750 mg total) into feeding tube 2 (two) times daily. 10/07/20   Jackelyn Poling, DO  levothyroxine (SYNTHROID) 100 MCG tablet Place 1 tablet (100 mcg total) into feeding tube daily at 6 (six) AM. 07/16/20   Marcine Matar, MD  Nutritional Supplements (FEEDING SUPPLEMENT, OSMOLITE 1.2 CAL,) LIQD Place 355 mLs into feeding tube 5 (five) times daily. 02/18/21   Lynn Ito, MD  polyethylene glycol (MIRALAX / GLYCOLAX) 17 g packet Place 17 g into feeding tube 2 (two) times daily. 10/07/20   Jackelyn Poling, DO  QUEtiapine (SEROQUEL) 50 MG tablet Place 1 tablet (50 mg total) into feeding tube 2 (two) times daily. 12/21/20 09/27/23  Osvaldo Shipper, MD  scopolamine (TRANSDERM-SCOP) 1 MG/3DAYS Place 1 patch onto the skin every 3 (three) days.    [provider]  senna (SENOKOT) 8.6 MG TABS tablet Place 1 tablet (8.6 mg total) into feeding tube daily. 10/08/20   Jackelyn Poling, DO  Water For Irrigation, Sterile (FREE WATER) SOLN Place 150 mLs into feeding tube every 6 (six) hours. 02/18/21   Lynn Ito, MD  hydrALAZINE (  APRESOLINE) 50 MG tablet Place 1 tablet (50 mg total) into feeding tube every 8 (eight) hours. 08/09/20 10/07/20  Marcine Matar, MD      Allergies    Patient has no known allergies.    Review of Systems   Review of Systems  Unable to perform ROS: Patient nonverbal    Physical Exam Updated Vital Signs BP 124/87 (BP Location: Left Arm)   Pulse 89   Temp 98.2 F (36.8 C) (Oral)   Resp 11   Ht 5\' 6"  (1.676 m)   Wt 72.5 kg   SpO2 97%   BMI 25.80 kg/m  Physical Exam Vitals and nursing note reviewed.  Constitutional:      General: She is  not in acute distress.    Appearance: She is well-developed. She is not ill-appearing or diaphoretic.  HENT:     Head: Normocephalic and atraumatic.  Cardiovascular:     Rate and Rhythm: Regular rhythm. Tachycardia present.     Heart sounds: Normal heart sounds.     Comments: HR low 100s Pulmonary:     Effort: Pulmonary effort is normal.     Breath sounds: Wheezing present.  Abdominal:     Palpations: Abdomen is soft.     Tenderness: There is no abdominal tenderness.  Musculoskeletal:     Right lower leg: No edema.     Left lower leg: No edema.  Skin:    General: Skin is warm and dry.  Neurological:     Mental Status: She is alert.     Comments: Equal strength in all 4 extremities.     ED Results / Procedures / Treatments   Labs (all labs ordered are listed, but only abnormal results are displayed) Labs Reviewed  URINALYSIS, W/ REFLEX TO CULTURE (INFECTION SUSPECTED) - Abnormal; Notable for the following components:      Result Value   Color, Urine AMBER (*)    APPearance CLOUDY (*)    Hgb urine dipstick SMALL (*)    Protein, ur 100 (*)    Leukocytes,Ua LARGE (*)    Bacteria, UA RARE (*)    All other components within normal limits  CBC WITH DIFFERENTIAL/PLATELET - Abnormal; Notable for the following components:   RBC 5.16 (*)    MCH 25.2 (*)    All other components within normal limits  COMPREHENSIVE METABOLIC PANEL - Abnormal; Notable for the following components:   Chloride 97 (*)    Glucose, Bld 206 (*)    All other components within normal limits  SARS CORONAVIRUS 2 BY RT PCR  CULTURE, BLOOD (ROUTINE X 2)  CULTURE, BLOOD (ROUTINE X 2)  URINE CULTURE  LACTIC ACID, PLASMA  HEMOGLOBIN A1C  PROCALCITONIN  HIV ANTIBODY (ROUTINE TESTING W REFLEX)  CBC  CREATININE, SERUM  TROPONIN I (HIGH SENSITIVITY)    EKG None  Radiology DG Chest Portable 1 View  Result Date: 11/30/2022 CLINICAL DATA:  Cough.  History of breast cancer. EXAM: PORTABLE CHEST 1 VIEW  COMPARISON:  None Available. FINDINGS: Film is rotated to the left. Tracheostomy tube in place. Presumed vertically oriented catheter for VP shunt along the right hemithorax. Normal cardiopericardial silhouette when adjusting for technique. No pneumothorax or edema. Question left retrocardiac opacity versus related to the rotation. Overlapping cardiac leads. IMPRESSION: Tracheostomy tube.  Presumed VP shunt. Rotated radiograph to the left with some left retrocardiac opacity. Subtle infiltrate or effusion is possible. No pneumothorax or edema. Electronically Signed   By: Piedad Climes.D.  On: 11/30/2022 12:23   DG Abd 2 Views  Result Date: 11/29/2022 CLINICAL DATA:  Abdominal distention. EXAM: ABDOMEN - 2 VIEW COMPARISON:  PET-CT dated 08/26/2022. FINDINGS: The bowel gas pattern is normal. There is no evidence of free air. A gastrostomy tube is noted. A catheter overlies the right chest, right abdomen and left abdomen, consistent with a ventriculoperitoneal catheter. A calcified fibroid is seen in the pelvis. Degenerative changes are seen in the spine. IMPRESSION: Nonobstructive bowel gas pattern. Electronically Signed   By: Romona Curls M.D.   On: 11/29/2022 12:40    Procedures Procedures    Medications Ordered in ED Medications  azithromycin (ZITHROMAX) 500 mg in sodium chloride 0.9 % 250 mL IVPB (500 mg Intravenous New Bag/Given 11/30/22 1256)  heparin injection 5,000 Units (has no administration in time range)  lactated ringers infusion (has no administration in time range)  acetaminophen (TYLENOL) tablet 650 mg (has no administration in time range)    Or  acetaminophen (TYLENOL) suppository 650 mg (has no administration in time range)  HYDROcodone-acetaminophen (NORCO/VICODIN) 5-325 MG per tablet 1 tablet (has no administration in time range)  fentaNYL (SUBLIMAZE) injection 12.5 mcg (has no administration in time range)  bisacodyl (DULCOLAX) EC tablet 5 mg (has no administration in time  range)  ondansetron (ZOFRAN) tablet 4 mg (has no administration in time range)    Or  ondansetron (ZOFRAN) injection 4 mg (has no administration in time range)  pantoprazole (PROTONIX) EC tablet 40 mg (has no administration in time range)  cefTRIAXone (ROCEPHIN) 1 g in sodium chloride 0.9 % 100 mL IVPB (has no administration in time range)  azithromycin (ZITHROMAX) 500 mg in sodium chloride 0.9 % 250 mL IVPB (has no administration in time range)  ipratropium-albuterol (DUONEB) 0.5-2.5 (3) MG/3ML nebulizer solution 3 mL (has no administration in time range)  lactated ringers bolus 1,000 mL (1,000 mLs Intravenous Bolus 11/30/22 1031)  ipratropium-albuterol (DUONEB) 0.5-2.5 (3) MG/3ML nebulizer solution 3 mL (3 mLs Nebulization Given 11/30/22 1030)  acetaminophen (TYLENOL) 160 MG/5ML solution 650 mg (650 mg Per Tube Given 11/30/22 1131)  cefTRIAXone (ROCEPHIN) 1 g in sodium chloride 0.9 % 100 mL IVPB (0 g Intravenous Stopped 11/30/22 1201)    ED Course/ Medical Decision Making/ A&P                                 Medical Decision Making Amount and/or Complexity of Data Reviewed Labs: ordered.    Details: Normal WBC.  Leukocytes in the urine. Radiology: ordered and independent interpretation performed.    Details: Left lower lobe pneumonia ECG/medicine tests: ordered and independent interpretation performed.    Details: No ischemia.  Risk OTC drugs. Prescription drug management. Decision regarding hospitalization.   Patient presents with weakness and falls at her facility.  She has diffuse wheezing and was given some albuterol.  Also appears to have likely pneumonia on the x-ray which would correlate with her new productive cough.  Questionable urine, had negative urine yesterday but leukocytes today.  Should be covered both its Rocephin either way.  Will also add azithromycin.  Otherwise she is breathing more comfortably after the DuoNeb.  Given her falls/weakness, I think is reasonable to  admit for further management.  Discussed with Dr. Laural Benes.        Final Clinical Impression(s) / ED Diagnoses Final diagnoses:  Pneumonia of left lower lobe due to infectious organism    Rx / DC  Orders ED Discharge Orders     None         Pricilla Loveless, MD 11/30/22 1316

## 2022-11-30 NOTE — H&P (Signed)
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  At baseline she is reportedly ambulatory.  She had been experiencing generalized weakness for the past several days and there was a concern that she had a UTI.  There was no fever or chills abdominal pain or chest pain or shortness of breath.    She wears a depends at baseline.  Her G-tube was recently replaced.  All nutrition and medications are given through G-tube.  She was worked up in the ED and discharged back to [facility]  She returned to ED today with fever of 101.2, persistent weakness shortness of breath emesis and nausea.  They reported that patient has had increasing tracheal secretions, wheezing and coughing.  She has not been able to ambulate which is not her baseline.  Her workup in the ED was suspicious for a pneumonia and she was started on treatment with IV antibiotics and hospital admission was requested.    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)    Stroke (HCC)    Subdural  hematoma (HCC)    Tracheostomy dependent (HCC)    Tracheostomy present [facility])     Past Surgical History:  Procedure Laterality Date   BURR HOLE N/A 03/18/2020   Procedure: Ezekiel Ina;  Surgeon: Lisbeth Renshaw, MD;  Location: [facility] OR;  Service: Neurosurgery;  Laterality: N/A;   CRANIOTOMY Left 01/30/2020   Procedure: LEFT FAR LATERAL CRANIOTOMY FOR ANEURYSM CLIPPING;  Surgeon: Lisbeth Renshaw, MD;  Location: [facility] OR;  Service: Neurosurgery;  Laterality: Left;   DIRECT LARYNGOSCOPY N/A 02/29/2020   Procedure: DIRECT LARYNGOSCOPY;  Surgeon: Serena Colonel, MD;  Location: [facility] OR;  Service: ENT;  Laterality: N/A;   DIRECT LARYNGOSCOPY N/A 10/03/2020   Procedure: DIRECT LARYNGOSCOPY w/esophagascopy;  Surgeon: Serena Colonel, MD;  Location: [facility] OR;  Service: ENT;  Laterality: N/A;   INTUBATION-ENDOTRACHEAL WITH TRACHEOSTOMY STANDBY N/A 12/14/2020   Procedure: Alcario Drought WITH TRACHEOSTOMY STANDBY;  Surgeon: Bud Face, MD;  Location: [facility] ORS;  Service: ENT;  Laterality: N/A;   IR ANGIO INTRA EXTRACRAN SEL INTERNAL CAROTID BILAT MOD SED  01/30/2020   IR ANGIO VERTEBRAL SEL VERTEBRAL UNI L MOD SED  01/30/2020   IR CM INJ ANY COLONIC TUBE W/FLUORO  07/03/2020   IR GASTROSTOMY TUBE MOD SED  02/22/2020   IR PATIENT EVAL TECH 0-60 MINS  02/11/2022   IR REPLC GASTRO/COLONIC TUBE PERCUT W/FLUORO  07/03/2020   IR REPLC GASTRO/COLONIC TUBE PERCUT W/FLUORO  09/22/2022   LAPAROSCOPIC REVISION VENTRICULAR-PERITONEAL (V-P) SHUNT N/A 03/18/2020   Procedure: LAPAROSCOPIC REVISION VENTRICULAR-PERITONEAL (V-P) SHUNT;  Surgeon: Fritzi Mandes, MD;  Location:  [facility] OR;  Service: General;  Laterality: N/A;   RADIOLOGY WITH ANESTHESIA N/A 01/30/2020   Procedure: IR WITH ANESTHESIA;  Surgeon: Lisbeth Renshaw, MD;  Location: [facility] OR;  Service: Radiology;  Laterality: N/A;   THYROIDECTOMY N/A 02/08/2020   Procedure: THYROIDECTOMY;  Surgeon: Serena Colonel, MD;  Location: [facility] OR;  Service: ENT;  Laterality: N/A;    TRACHEOSTOMY TUBE PLACEMENT N/A 02/08/2020   Procedure: TRACHEOSTOMY;  Surgeon: Serena Colonel, MD;  Location: [facility] OR;  Service: ENT;  Laterality: N/A;   TRACHEOSTOMY TUBE PLACEMENT N/A 02/29/2020   Procedure: TRACHEOSTOMY EXCHANGE;  Surgeon: Serena Colonel, MD;  Location: [facility] OR;  Service: ENT;  Laterality: N/A;   VENTRICULOPERITONEAL SHUNT N/A 03/18/2020   Procedure: RIGHT SHUNT INSERTION VENTRICULAR-PERITONEAL/ BURR HOLE Evacuation of Subdural Hematoma;  Surgeon: Lisbeth Renshaw, MD;  Location[facility] OR;  Service: Neurosurgery;  Laterality: N/A;     reports that she has quit smoking. She has never used smokeless tobacco. She reports that she does not currently use alcohol. She reports that she does not currently use drugs.  No Known Allergies  Family History  Problem Relation Age of Onset   Breast cancer Sister    Ovarian cancer Sister        dx 30s,d. 30s   Brain cancer Daughter        dx 73s    Prior to Admission medications   Medication Sig Start Date End Date Taking? Authorizing Provider  acetaminophen (TYLENOL) 325 MG tablet Place 2 tablets (650 mg total) into feeding tube every 6 (six) hours as needed for mild pain. 05/20/20   Angiulli, Mcarthur Rossetti, PA-C  ALPRAZolam Prudy Feeler) 0.5 MG tablet Take 0.5 mg by mouth 3 (three) times daily as needed. 04/23/21   [provider]  amLODipine (NORVASC) 5 MG tablet Place 1 tablet (5 mg total) into feeding tube daily. 02/19/21   Lynn Ito, MD  anastrozole (ARIMIDEX) 1 MG tablet Take 1 tablet (1 mg total) by mouth daily. 06/29/21   Doreatha Massed, MD  chlorhexidine (PERIDEX) 0.12 % solution 15 mLs by Mouth Rinse route 2 (two) times daily. 10/07/20   Jackelyn Poling, DO  DULoxetine (CYMBALTA) 20 MG capsule Take 20 mg by mouth daily. 05/20/21   [provider]  fluticasone (FLONASE) 50 MCG/ACT nasal spray Place 1 spray into both nostrils 2 (two) times daily. 02/18/21   Lynn Ito, MD  glycopyrrolate (ROBINUL) 1 MG tablet Take 1 tablet  (1 mg total) by mouth 2 (two) times daily. Patient taking differently: Place 1 mg into feeding tube 2 (two) times daily. 02/18/21   Lynn Ito, MD  guaiFENesin (ROBITUSSIN) 100 MG/5ML liquid Place 10 mLs into feeding tube every 8 (eight) hours. 02/18/21   Lynn Ito, MD  ipratropium-albuterol (DUONEB) 0.5-2.5 (3) MG/3ML SOLN Take 3 mLs by nebulization every 4 (four) hours as needed. 12/21/20   Osvaldo Shipper, MD  levETIRAcetam (KEPPRA) 100 MG/ML solution Place 7.5 mLs (750 mg total) into feeding tube 2 (two) times daily. 10/07/20   Jackelyn Poling, DO  levothyroxine (SYNTHROID) 100 MCG tablet Place 1 tablet (100 mcg total) into feeding tube daily at 6 (six) AM. 07/16/20   Marcine Matar, MD  Nutritional Supplements (FEEDING SUPPLEMENT, OSMOLITE 1.2 CAL,) LIQD Place 355 mLs into feeding tube 5 (five) times daily. 02/18/21   Lynn Ito, MD  polyethylene glycol (MIRALAX / GLYCOLAX) 17 g packet Place 17 g into feeding tube 2 (two) times daily. 10/07/20   Jackelyn Poling, DO  QUEtiapine (SEROQUEL) 50 MG tablet  Place 1 tablet (50 mg total) into feeding tube 2 (two) times daily. 12/21/20 09/27/23  Osvaldo Shipper, MD  scopolamine (TRANSDERM-SCOP) 1 MG/3DAYS Place 1 patch onto the skin every 3 (three) days.    [provider]  senna (SENOKOT) 8.6 MG TABS tablet Place 1 tablet (8.6 mg total) into feeding tube daily. 10/08/20   Jackelyn Poling, DO  Water For Irrigation, Sterile (FREE WATER) SOLN Place 150 mLs into feeding tube every 6 (six) hours. 02/18/21   Lynn Ito, MD  hydrALAZINE (APRESOLINE) 50 MG tablet Place 1 tablet (50 mg total) into feeding tube every 8 (eight) hours. 08/09/20 10/07/20  Marcine Matar, MD    Physical Exam: Vitals:   11/30/22 1245 11/30/22 1300 11/30/22 1345 11/30/22 1441  BP: 124/87 131/84 (!) 122/110 (!) 132/100  Pulse: 89 86 90 94  Resp: 11 17 (!) 22 20  Temp: 98.2 F (36.8 C)   98.3 F (36.8 C)  TempSrc: Oral   Oral  SpO2: 97% 97% 94% 100%  Weight:       Height:        Constitutional: calm, chronically ill appearing, nonverbal, tracheostomy present; does not appear distressed.  Pleasant.   Eyes: PERRL, lids and conjunctivae normal ENMT: Mucous membranes are dry and pale.   Neck: tracheostomy present.   Respiratory: bibasilar expiratory wheezing, rales LLL, no crackles.  No accessory muscle use.  Cardiovascular: normal s1, s2 sounds, no murmurs / rubs / gallops. No extremity edema. 2+ pedal pulses. No carotid bruits.  Abdomen: appears mildly distended, G tube in place and appears clean and dry around entrance, no tenderness, no masses palpated. No hepatosplenomegaly. Bowel sounds positive but hypoactive.  Musculoskeletal: no clubbing / cyanosis. No joint deformity upper and lower extremities. Good ROM, no contractures. Normal muscle tone.  Skin: no rashes, lesions, ulcers. No induration Neurologic: CN 2-12 grossly intact. Sensation intact, DTR normal. Strength 5/5 in all 4.  Psychiatric: Normal judgment and insight. Alert and oriented x 3. Normal mood.   Labs on Admission: I have personally reviewed following labs and imaging studies  CBC: Recent Labs  Lab 11/26/22 0902 11/29/22 1050 11/30/22 1000  WBC 5.8 6.7 7.8  NEUTROABS 3.9 5.0 6.2  HGB 12.8 12.6 13.0  HCT 40.6 40.6 41.8  MCV 81.0 81.4 81.0  PLT 219 229 225   Basic Metabolic Panel: Recent Labs  Lab 11/26/22 0902 11/29/22 1050 11/30/22 1000  NA 136 136 137  K 3.7 3.9 3.6  CL 99 99 97*  CO2 29 30 31   GLUCOSE 192* 212* 206*  BUN 17 18 18   CREATININE 0.64 0.77 0.71  CALCIUM 8.9 8.7* 8.9   GFR: Estimated Creatinine Clearance: 71.5 mL/min (by C-G formula based on SCr of 0.71 mg/dL). Liver Function Tests: Recent Labs  Lab 11/26/22 0902 11/30/22 1000  AST 31 34  ALT 14 16  ALKPHOS 61 69  BILITOT 0.5 0.6  PROT 7.8 7.5  ALBUMIN 3.6 3.5   No results for input(s): "LIPASE", "AMYLASE" in the last 168 hours. No results for input(s): "AMMONIA" in the last 168  hours. Coagulation Profile: No results for input(s): "INR", "PROTIME" in the last 168 hours. Cardiac Enzymes: No results for input(s): "CKTOTAL", "CKMB", "CKMBINDEX", "TROPONINI" in the last 168 hours. BNP (last 3 results) No results for input(s): "PROBNP" in the last 8760 hours. HbA1C: No results for input(s): "HGBA1C" in the last 72 hours. CBG: No results for input(s): "GLUCAP" in the last 168 hours. Lipid Profile: No results for input(s): "  CHOL", "HDL", "LDLCALC", "TRIG", "CHOLHDL", "LDLDIRECT" in the last 72 hours. Thyroid Function Tests: No results for input(s): "TSH", "T4TOTAL", "FREET4", "T3FREE", "THYROIDAB" in the last 72 hours. Anemia Panel: No results for input(s): "VITAMINB12", "FOLATE", "FERRITIN", "TIBC", "IRON", "RETICCTPCT" in the last 72 hours. Urine analysis:    Component Value Date/Time   COLORURINE AMBER (A) 11/30/2022 1030   APPEARANCEUR CLOUDY (A) 11/30/2022 1030   LABSPEC 1.024 11/30/2022 1030   PHURINE 7.0 11/30/2022 1030   GLUCOSEU NEGATIVE 11/30/2022 1030   HGBUR SMALL (A) 11/30/2022 1030   BILIRUBINUR NEGATIVE 11/30/2022 1030   KETONESUR NEGATIVE 11/30/2022 1030   PROTEINUR 100 (A) 11/30/2022 1030   NITRITE NEGATIVE 11/30/2022 1030   LEUKOCYTESUR LARGE (A) 11/30/2022 1030    Radiological Exams on Admission: DG Chest Portable 1 View  Result Date: 11/30/2022 CLINICAL DATA:  Cough.  History of breast cancer. EXAM: PORTABLE CHEST 1 VIEW COMPARISON:  None Available. FINDINGS: Film is rotated to the left. Tracheostomy tube in place. Presumed vertically oriented catheter for VP shunt along the right hemithorax. Normal cardiopericardial silhouette when adjusting for technique. No pneumothorax or edema. Question left retrocardiac opacity versus related to the rotation. Overlapping cardiac leads. IMPRESSION: Tracheostomy tube.  Presumed VP shunt. Rotated radiograph to the left with some left retrocardiac opacity. Subtle infiltrate or effusion is possible. No  pneumothorax or edema. Electronically Signed   By: Karen Kays M.D.   On: 11/30/2022 12:23   DG Abd 2 Views  Result Date: 11/29/2022 CLINICAL DATA:  Abdominal distention. EXAM: ABDOMEN - 2 VIEW COMPARISON:  PET-CT dated 08/26/2022. FINDINGS: The bowel gas pattern is normal. There is no evidence of free air. A gastrostomy tube is noted. A catheter overlies the right chest, right abdomen and left abdomen, consistent with a ventriculoperitoneal catheter. A calcified fibroid is seen in the pelvis. Degenerative changes are seen in the spine. IMPRESSION: Nonobstructive bowel gas pattern. Electronically Signed   By: Romona Curls M.D.   On: 11/29/2022 12:40    EKG: Independently reviewed. 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   H/O total thyroidectomy   Tracheostomy status (HCC)   PEG (percutaneous endoscopic gastrostomy) status (HCC)   Essential hypertension   Seizure prophylaxis   Ineffective airway clearance   Postoperative hypothyroidism   S/P ventriculoperitoneal shunt   Functional urinary incontinence   Bilateral vocal cord paralysis   Tracheostomy dependence (HCC)   Generalized weakness   History of breast cancer   Working diagnosis of pneumonia  -Pt presented with SOB, cough, weakness and increase in tracheal secretions -she has wheezing on exam -she was febrile to 101 per EMS report -Sars 2 coronavirus testing negative  -check a procalcitonin  -empirically cover with ceftriaxone and azithromycin started in ED -Duonebs every 4 hours for now -supportive measures as ordered -trach care with trach suctioning as needed  -follow blood cultures   Fever  -suspect due to pneumonia -urine specimen contaminated but had totally clean urinalysis yesterday -do not suspect UTI  Generalized  Weakness -per daughter's report pt has had a precipitous decline in last several days and has not been ambulatory -possibly due to pneumonia -treating pneumonia  -follow clinical response   Dysphagia -pt uses PEG for all nutrition and medication -dietitian consulted to restart tube feeds and water irrigation and flushes  Epilepsy -resume regular home  meds when reconciled  Hypothyroidism -resume home levothyroxine   Essential Hypertension  -resume BP medications when reconciled   Behavioral Health -resume home medications  DVT prophylaxis: sq heparin   Code Status: Full   Family Communication:   Disposition Plan: anticipate return to LTC [facility])   Consults called:  dietitian Admission status: OBV  Level of care: Med-Surg Standley Dakins MD [facility] How to contact the [facility] Attending or Consulting provider 7A - 7P or covering provider during after hours 7P -7A, for this patient?  Check the care team in [facility] and look for a) attending/consulting [facility] provider listed and b) the [facility] team listed Log into [email or web address] and use [place]'s universal password to access. If you do not have the password, please contact the hospital operator. Locate the [facility] provider you are looking for under [facility] and page to a number that you can be directly reached. If you still have difficulty reaching the provider, please page the [facility] (Director on Call) for the Hospitalists listed on amion for assistance.   If 7PM-7AM, please contact night-coverage [email or web address] Password [email or web address]  11/30/2022, 3:10 PM

## 2022-11-30 NOTE — ED Notes (Signed)
ED TO INPATIENT HANDOFF REPORT  ED Nurse Name and Phone #: [phone]  S Name/Age/Gender Margaretmary Eddy 67 y.o. female Room/Bed: APA14/APA14  Code Status   Code Status: Full Code  Home/SNF/Other SNF Patient oriented to: self, place, and situation Is this baseline? Yes   Triage Complete: Triage complete  Chief Complaint Pneumonia [J18.9]  Triage Note Pt brought in by [facility] for weakness, SOB, emesis, and nausea for multiple days. Pt is from the [facility]. Pt had temp of 101.2 otw to the hospital.    Allergies No Known Allergies  Level of Care/Admitting Diagnosis ED Disposition     ED Disposition  Admit   Condition  --   Comment  Hospital Area: [facility] [[id number]]  Level of Care: Med-Surg [16]  Covid Evaluation: Asymptomatic - no recent exposure (last 10 days) testing not required  Diagnosis: Pneumonia [227785]  Admitting Physician: Cleora Fleet [[id number]]  Attending Physician: Cleora Fleet [[id number]]          B Medical/Surgery History 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)    Stroke (HCC)    Subdural hematoma (HCC)    Tracheostomy dependent (HCC)    Tracheostomy present [facility])    Past Surgical History:  Procedure Laterality Date   BURR HOLE N/A 03/18/2020   Procedure: Ezekiel Ina;  Surgeon: Lisbeth Renshaw, MD;  Location: [facility] OR;  Service: Neurosurgery;  Laterality: N/A;   CRANIOTOMY Left 01/30/2020   Procedure: LEFT FAR LATERAL CRANIOTOMY FOR ANEURYSM CLIPPING;  Surgeon: Lisbeth Renshaw, MD;  Location: [facility] OR;  Service: Neurosurgery;  Laterality: Left;   DIRECT LARYNGOSCOPY N/A 02/29/2020   Procedure: DIRECT LARYNGOSCOPY;  Surgeon: Serena Colonel, MD;  Location: [facility] OR;  Service: ENT;  Laterality: N/A;   DIRECT LARYNGOSCOPY  N/A 10/03/2020   Procedure: DIRECT LARYNGOSCOPY w/esophagascopy;  Surgeon: Serena Colonel, MD;  Location: [facility] OR;  Service: ENT;  Laterality: N/A;   INTUBATION-ENDOTRACHEAL WITH TRACHEOSTOMY STANDBY N/A 12/14/2020   Procedure: Alcario Drought WITH TRACHEOSTOMY STANDBY;  Surgeon: Bud Face, MD;  Location: [facility] ORS;  Service: ENT;  Laterality: N/A;   IR ANGIO INTRA EXTRACRAN SEL INTERNAL CAROTID BILAT MOD SED  01/30/2020   IR ANGIO VERTEBRAL SEL VERTEBRAL UNI L MOD SED  01/30/2020   IR CM INJ ANY COLONIC TUBE W/FLUORO  07/03/2020   IR GASTROSTOMY TUBE MOD SED  02/22/2020   IR PATIENT EVAL TECH 0-60 MINS  02/11/2022   IR REPLC GASTRO/COLONIC TUBE PERCUT W/FLUORO  07/03/2020   IR REPLC GASTRO/COLONIC TUBE PERCUT W/FLUORO  09/22/2022   LAPAROSCOPIC REVISION VENTRICULAR-PERITONEAL (V-P) SHUNT N/A 03/18/2020   Procedure: LAPAROSCOPIC REVISION VENTRICULAR-PERITONEAL (V-P) SHUNT;  Surgeon: Fritzi Mandes, MD;  Location: [facility] OR;  Service: General;  Laterality: N/A;   RADIOLOGY WITH ANESTHESIA N/A 01/30/2020   Procedure: IR WITH ANESTHESIA;  Surgeon: Lisbeth Renshaw, MD;  Location: [facility] OR;  Service: Radiology;  Laterality: N/A;   THYROIDECTOMY N/A 02/08/2020   Procedure: THYROIDECTOMY;  Surgeon: Serena Colonel, MD;  Location: [facility] OR;  Service: ENT;  Laterality: N/A;   TRACHEOSTOMY TUBE PLACEMENT N/A 02/08/2020   Procedure: TRACHEOSTOMY;  Surgeon: Serena Colonel, MD;  Location: [facility] OR;  Service: ENT;  Laterality: N/A;   TRACHEOSTOMY TUBE PLACEMENT N/A 02/29/2020   Procedure: TRACHEOSTOMY EXCHANGE;  Surgeon:  Serena Colonel, MD;  Location: [facility] OR;  Service: ENT;  Laterality: N/A;   VENTRICULOPERITONEAL SHUNT N/A 03/18/2020   Procedure: RIGHT SHUNT INSERTION VENTRICULAR-PERITONEAL/ BURR HOLE Evacuation of Subdural Hematoma;  Surgeon: Lisbeth Renshaw, MD;  Location: [facility] OR;  Service: Neurosurgery;  Laterality: N/A;     A IV Location/Drains/Wounds Patient Lines/Drains/Airways Status     Active  Line/Drains/Airways     Name Placement date Placement time Site Days   Peripheral IV 11/30/22 20 G 1" Right Antecubital 11/30/22  1000  Antecubital  less than 1   Gastrostomy/Enterostomy Gastrostomy 20 Fr. RUQ 09/22/22  1013  RUQ  69   Tracheostomy Shiley Flexible 4 mm Uncuffed 01/08/21  1632  4 mm  691            Intake/Output Last 24 hours  Intake/Output Summary (Last 24 hours) at 11/30/2022 1318 Last data filed at 11/30/2022 1201 Gross per 24 hour  Intake 90.86 ml  Output --  Net 90.86 ml    Labs/Imaging Results for orders placed or performed during the hospital encounter of 11/30/22 (from the past 48 hour(s))  CBC with Differential     Status: Abnormal   Collection Time: 11/30/22 10:00 AM  Result Value Ref Range   WBC 7.8 4.0 - 10.5 K/uL   RBC 5.16 (H) 3.87 - 5.11 MIL/uL   Hemoglobin 13.0 12.0 - 15.0 g/dL   HCT 91.4 78.2 - 95.6 %   MCV 81.0 80.0 - 100.0 fL   MCH 25.2 (L) 26.0 - 34.0 pg   MCHC 31.1 30.0 - 36.0 g/dL   RDW 21.3 08.6 - 57.8 %   Platelets 225 150 - 400 K/uL   nRBC 0.0 0.0 - 0.2 %   Neutrophils Relative % 80 %   Neutro Abs 6.2 1.7 - 7.7 K/uL   Lymphocytes Relative 13 %   Lymphs Abs 1.0 0.7 - 4.0 K/uL   Monocytes Relative 6 %   Monocytes Absolute 0.5 0.1 - 1.0 K/uL   Eosinophils Relative 1 %   Eosinophils Absolute 0.1 0.0 - 0.5 K/uL   Basophils Relative 0 %   Basophils Absolute 0.0 0.0 - 0.1 K/uL   Immature Granulocytes 0 %   Abs Immature Granulocytes 0.02 0.00 - 0.07 K/uL    Comment: Performed at [facility], [address] [place], [place] [address]  Troponin I (High Sensitivity)     Status: None   Collection Time: 11/30/22 10:00 AM  Result Value Ref Range   Troponin I (High Sensitivity) 4 <18 ng/L    Comment: (NOTE) Elevated high sensitivity troponin I (hsTnI) values and significant  changes across serial measurements may suggest ACS but many other  chronic and acute conditions are known to elevate hsTnI results.  Refer to the "Links" section  for chest pain algorithms and additional  guidance. Performed at [facility], [address], [place], [place] [address]   Comprehensive metabolic panel     Status: Abnormal   Collection Time: 11/30/22 10:00 AM  Result Value Ref Range   Sodium 137 135 - 145 mmol/L   Potassium 3.6 3.5 - 5.1 mmol/L   Chloride 97 (L) 98 - 111 mmol/L   CO2 31 22 - 32 mmol/L   Glucose, Bld 206 (H) 70 - 99 mg/dL    Comment: Glucose reference range applies only to samples taken after fasting for at least 8 hours.   BUN 18 8 - 23 mg/dL   Creatinine, Ser 1.32 0.44 - 1.00 mg/dL   Calcium  8.9 8.9 - 10.3 mg/dL   Total Protein 7.5 6.5 - 8.1 g/dL   Albumin 3.5 3.5 - 5.0 g/dL   AST 34 15 - 41 U/L   ALT 16 0 - 44 U/L   Alkaline Phosphatase 69 38 - 126 U/L   Total Bilirubin 0.6 0.3 - 1.2 mg/dL   GFR, Estimated >08 >65 mL/min    Comment: (NOTE) Calculated using the CKD-EPI Creatinine Equation (2021)    Anion gap 9 5 - 15    Comment: Performed at [facility], [address] [place], [place] [address]  Lactic acid, plasma     Status: None   Collection Time: 11/30/22 10:00 AM  Result Value Ref Range   Lactic Acid, Venous 1.3 0.5 - 1.9 mmol/L    Comment: Performed at [facility], [address] [place], [place] [address]  SARS Coronavirus 2 by RT PCR (hospital order, performed in [facility] hospital lab) *cepheid single result test* Anterior Nasal Swab     Status: None   Collection Time: 11/30/22 10:10 AM   Specimen: Anterior Nasal Swab  Result Value Ref Range   SARS Coronavirus 2 by RT PCR NEGATIVE NEGATIVE    Comment: (NOTE) SARS-CoV-2 target nucleic acids are NOT DETECTED.  The SARS-CoV-2 RNA is generally detectable in upper and lower respiratory specimens during the acute phase of infection. The lowest concentration of SARS-CoV-2 viral copies this assay can detect is 250 copies / mL. A negative result does not preclude SARS-CoV-2 infection and should not be used as the sole basis for treatment or  other patient management decisions.  A negative result may occur with improper specimen collection / handling, submission of specimen other than nasopharyngeal swab, presence of viral mutation(s) within the areas targeted by this assay, and inadequate number of viral copies (<250 copies / mL). A negative result must be combined with clinical observations, patient history, and epidemiological information.  Fact Sheet for Patients:   RoadLapTop.co.za  Fact Sheet for Healthcare Providers: http://kim-miller.com/  This test is not yet approved or  cleared by the [place] FDA and has been authorized for detection and/or diagnosis of SARS-CoV-2 by FDA under an Emergency Use Authorization (EUA).  This EUA will remain in effect (meaning this test can be used) for the duration of the COVID-19 declaration under Section 564(b)(1) of the Act, 21 U.S.C. section 360bbb-3(b)(1), unless the authorization is terminated or revoked sooner.  Performed at [facility], [address] [place], [place] [address]   Urinalysis, w/ Reflex to Culture (Infection Suspected) -Urine, Clean Catch     Status: Abnormal   Collection Time: 11/30/22 10:30 AM  Result Value Ref Range   Specimen Source URINE, CLEAN CATCH    Color, Urine AMBER (A) YELLOW    Comment: BIOCHEMICALS MAY BE AFFECTED BY COLOR   APPearance CLOUDY (A) CLEAR   Specific Gravity, Urine 1.024 1.005 - 1.030   pH 7.0 5.0 - 8.0   Glucose, UA NEGATIVE NEGATIVE mg/dL   Hgb urine dipstick SMALL (A) NEGATIVE   Bilirubin Urine NEGATIVE NEGATIVE   Ketones, ur NEGATIVE NEGATIVE mg/dL   Protein, ur 440 (A) NEGATIVE mg/dL   Nitrite NEGATIVE NEGATIVE   Leukocytes,Ua LARGE (A) NEGATIVE   RBC / HPF >50 0 - 5 RBC/hpf   WBC, UA >50 0 - 5 WBC/hpf    Comment:        Reflex urine culture not performed if WBC <=10, OR if Squamous epithelial cells >5. If Squamous epithelial cells >5 suggest  recollection.     Bacteria, UA RARE (A) NONE SEEN   Squamous Epithelial / HPF 0-5 0 - 5 /HPF   WBC Clumps PRESENT    Mucus PRESENT    Budding Yeast PRESENT    Hyaline Casts, UA PRESENT    Ca Oxalate Crys, UA PRESENT     Comment: Performed at [facility], [address], [place], [place] [address]  Culture, blood (routine x 2)     Status: None (Preliminary result)   Collection Time: 11/30/22 10:57 AM   Specimen: BLOOD  Result Value Ref Range   Specimen Description BLOOD BLOOD LEFT HAND    Special Requests      BOTTLES DRAWN AEROBIC AND ANAEROBIC Blood Culture adequate volume Performed at [facility], [address], [place], [place] [address]    Culture PENDING    Report Status PENDING    DG Chest Portable 1 View  Result Date: 11/30/2022 CLINICAL DATA:  Cough.  History of breast cancer. EXAM: PORTABLE CHEST 1 VIEW COMPARISON:  None Available. FINDINGS: Film is rotated to the left. Tracheostomy tube in place. Presumed vertically oriented catheter for VP shunt along the right hemithorax. Normal cardiopericardial silhouette when adjusting for technique. No pneumothorax or edema. Question left retrocardiac opacity versus related to the rotation. Overlapping cardiac leads. IMPRESSION: Tracheostomy tube.  Presumed VP shunt. Rotated radiograph to the left with some left retrocardiac opacity. Subtle infiltrate or effusion is possible. No pneumothorax or edema. Electronically Signed   By: Karen Kays M.D.   On: 11/30/2022 12:23   DG Abd 2 Views  Result Date: 11/29/2022 CLINICAL DATA:  Abdominal distention. EXAM: ABDOMEN - 2 VIEW COMPARISON:  PET-CT dated 08/26/2022. FINDINGS: The bowel gas pattern is normal. There is no evidence of free air. A gastrostomy tube is noted. A catheter overlies the right chest, right abdomen and left abdomen, consistent with a ventriculoperitoneal catheter. A calcified fibroid is seen in the pelvis. Degenerative changes are seen in the spine. IMPRESSION: Nonobstructive bowel gas pattern.  Electronically Signed   By: Romona Curls M.D.   On: 11/29/2022 12:40    Pending Labs Unresulted Labs (From admission, onward)     Start     Ordered   12/01/22 0500  Procalcitonin  Tomorrow morning,   R       References:    Procalcitonin Lower Respiratory Tract Infection AND Sepsis Procalcitonin Algorithm   11/30/22 1306   12/01/22 0500  Basic metabolic panel  Tomorrow morning,   R        11/30/22 1313   12/01/22 0500  CBC  Tomorrow morning,   R        11/30/22 1313   11/30/22 1308  HIV Antibody (routine testing w rflx)  (HIV Antibody (Routine testing w reflex) panel)  Once,   R        11/30/22 1313   11/30/22 1308  CBC  (heparin)  Once,   R       Comments: Baseline for heparin therapy IF NOT ALREADY DRAWN.  Notify MD if PLT < 100 K.    11/30/22 1313   11/30/22 1308  Creatinine, serum  (heparin)  Once,   R       Comments: Baseline for heparin therapy IF NOT ALREADY DRAWN.    11/30/22 1313   11/30/22 1307  Hemoglobin A1c  Add-on,   AD        11/30/22 1306   11/30/22 1307  Procalcitonin  Add-on,   AD  References:    Procalcitonin Lower Respiratory Tract Infection AND Sepsis Procalcitonin Algorithm   11/30/22 1306   11/30/22 1030  Urine Culture  Once,   R        11/30/22 1030   11/30/22 1025  Culture, blood (routine x 2)  BLOOD CULTURE X 2,   R (with STAT occurrences)      11/30/22 1025            Vitals/Pain Today's Vitals   11/30/22 1005 11/30/22 1026 11/30/22 1042 11/30/22 1245  BP:    124/87  Pulse:    89  Resp:    11  Temp:  100 F (37.8 C)  98.2 F (36.8 C)  TempSrc:  Rectal  Oral  SpO2:   99% 97%  Weight: 72.5 kg     Height: 5\' 6"  (1.676 m)     PainSc:    0-No pain    Isolation Precautions No active isolations  Medications Medications  azithromycin (ZITHROMAX) 500 mg in sodium chloride 0.9 % 250 mL IVPB (500 mg Intravenous New Bag/Given 11/30/22 1256)  heparin injection 5,000 Units (has no administration in time range)  lactated ringers infusion  (has no administration in time range)  acetaminophen (TYLENOL) tablet 650 mg (has no administration in time range)    Or  acetaminophen (TYLENOL) suppository 650 mg (has no administration in time range)  HYDROcodone-acetaminophen (NORCO/VICODIN) 5-325 MG per tablet 1 tablet (has no administration in time range)  fentaNYL (SUBLIMAZE) injection 12.5 mcg (has no administration in time range)  bisacodyl (DULCOLAX) EC tablet 5 mg (has no administration in time range)  ondansetron (ZOFRAN) tablet 4 mg (has no administration in time range)    Or  ondansetron (ZOFRAN) injection 4 mg (has no administration in time range)  pantoprazole (PROTONIX) EC tablet 40 mg (has no administration in time range)  cefTRIAXone (ROCEPHIN) 1 g in sodium chloride 0.9 % 100 mL IVPB (has no administration in time range)  azithromycin (ZITHROMAX) 500 mg in sodium chloride 0.9 % 250 mL IVPB (has no administration in time range)  ipratropium-albuterol (DUONEB) 0.5-2.5 (3) MG/3ML nebulizer solution 3 mL (has no administration in time range)  lactated ringers bolus 1,000 mL (1,000 mLs Intravenous Bolus 11/30/22 1031)  ipratropium-albuterol (DUONEB) 0.5-2.5 (3) MG/3ML nebulizer solution 3 mL (3 mLs Nebulization Given 11/30/22 1030)  acetaminophen (TYLENOL) 160 MG/5ML solution 650 mg (650 mg Per Tube Given 11/30/22 1131)  cefTRIAXone (ROCEPHIN) 1 g in sodium chloride 0.9 % 100 mL IVPB (0 g Intravenous Stopped 11/30/22 1201)    Mobility non-ambulatory     Focused Assessments Pulmonary Assessment Handoff:  Lung sounds: Bilateral Breath Sounds: Diminished, Expiratory wheezes O2 Device: Nasal Cannula O2 Flow Rate (L/min): 3 L/min    R Recommendations: See Admitting Provider Note  Report given to:   Additional Notes: pt has hx of cancer. Daughter states pt usually uses walker but too weak to walk at this time. Daughter wants pt moved from [facility] to [facility].

## 2022-11-30 NOTE — Telephone Encounter (Signed)
Would you like for her to be seen sooner?

## 2022-11-30 NOTE — Progress Notes (Addendum)
11/30/2022 5:56 PM  I have been waiting several hours for home medications to be reconciled with multiple requests for it to be completed.  I have called over to [facility] at 5:50 pm and was told that the [facility] was already faxed to [facility] and they provided a confirmation.  I have requested the [facility] be faxed directly to me at my office which they have agreed to do and received at 6pm.  Maryln Manuel MD  Admitting Physician

## 2022-11-30 NOTE — ED Triage Notes (Signed)
Pt brought in by [facility] for weakness, SOB, emesis, and nausea for multiple days. Pt is from the [facility]. Pt had temp of 101.2 otw to the hospital.

## 2022-11-30 NOTE — TOC Initial Note (Addendum)
Transition of Care [facility]) - Initial/Assessment Note    Patient Details  Name: Stacy Moran MRN: [id number] Date of Birth: Aug 23, 1955  [facility] of [facility] CM/SW Contact:    Villa Herb, LCSWA Phone Number: 11/30/2022, 2:10 PM  Clinical Narrative:                 CSW completed chart review and noted that pt arrived from [facility]. CSW reached out to [place] in admissions to confirm that pt is LTC pt at their facility and they can accept back. CSW to start Fl2. TOC to follow.   Expected Discharge Plan: Long Term Nursing Home Barriers to Discharge: Continued Medical Work up   Patient Goals and CMS Choice Patient states their goals for this hospitalization and ongoing recovery are:: return to LTC CMS Medicare.gov Compare Post Acute Care list provided to:: Patient Choice offered to / list presented to : Patient      Expected Discharge Plan and Services In-house Referral: Clinical Social Work Discharge Planning Services: CM Consult Post Acute Care Choice: Nursing Home Living arrangements for the past 2 months: Skilled Nursing Facility                                      Prior Living Arrangements/Services Living arrangements for the past 2 months: Skilled Nursing Facility Lives with:: Facility Resident Patient language and need for interpreter reviewed:: Yes Do you feel safe going back to the place where you live?: Yes      Need for Family Participation in Patient Care: Yes (Comment) Care giver support system in place?: Yes (comment)   Criminal Activity/Legal Involvement Pertinent to Current Situation/Hospitalization: No - Comment as needed  Activities of Daily Living      Permission Sought/Granted                  Emotional Assessment Appearance:: Appears stated age Attitude/Demeanor/Rapport: Engaged     Alcohol / Substance Use: Not Applicable Psych Involvement: No (comment)  Admission diagnosis:  Pneumonia [J18.9] Patient Active Problem  List   Diagnosis Date Noted   Pneumonia 11/30/2022   Generalized weakness 11/30/2022   History of breast cancer 11/30/2022   Genetic testing 06/21/2022   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   Breast cancer, left breast (HCC) 06/28/2021   Mass of upper outer quadrant of left breast    Altered mental status 06/01/2021   Cardiac arrest (HCC) 06/01/2021    Class: Acute   Abnormal CT of the chest 06/01/2021   Bilateral vocal cord paralysis 01/14/2021   Fall (on) (from) other stairs and steps, initial encounter 08/26/2020   Subdural hematoma (HCC) 07/31/2020   History of hemorrhagic cerebrovascular accident (CVA) with residual deficit 06/06/2020   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   S/P ventriculoperitoneal shunt 06/06/2020   Functional urinary incontinence 06/06/2020   Ineffective airway clearance    Restlessness    Restlessness and agitation    Acute blood loss anemia    Essential hypertension    Seizure prophylaxis    Protein-calorie malnutrition, severe 04/24/2020   Dysphagia, post-stroke    Dysphagia    Status post tracheostomy (HCC)    S/P percutaneous endoscopic gastrostomy (PEG) tube placement (HCC)    ICH (intracerebral hemorrhage) (HCC) 04/22/2020   Obstructive hydrocephalus (HCC)    Seizure-like activity (HCC) 03/12/2020   PEG (percutaneous endoscopic gastrostomy) status (HCC) 03/08/2020   Hyperglycemia 03/08/2020   Acute respiratory failure (HCC)  Ventilator dependence (HCC)    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 [place] - [place], [place] - [address] [place]  [address] Phone: [phone] Fax: [phone]     Social Determinants of Health (SDOH) Social History: SDOH Screenings   Depression (PHQ2-9): Low Risk  (08/08/2020)  Recent Concern: Depression (PHQ2-9) - Medium Risk (06/05/2020)  Tobacco Use: Medium Risk (11/30/2022)   SDOH Interventions:     Readmission Risk Interventions    12/31/2020    3:40 PM  Readmission Risk Prevention Plan  Transportation Screening Complete  Medication Review (RN Care Manager) Complete  PCP or Specialist appointment within 3-5 days of discharge Complete  HRI or Home Care Consult Complete  SW Recovery Care/Counseling Consult Complete  Palliative Care Screening Not Applicable  Skilled Nursing Facility Complete

## 2022-12-01 ENCOUNTER — Inpatient Hospital Stay (HOSPITAL_COMMUNITY): Payer: Medicaid Other

## 2022-12-01 ENCOUNTER — Observation Stay (HOSPITAL_COMMUNITY): Payer: Medicaid Other

## 2022-12-01 DIAGNOSIS — F39 Unspecified mood [affective] disorder: Secondary | ICD-10-CM | POA: Diagnosis present

## 2022-12-01 DIAGNOSIS — J181 Lobar pneumonia, unspecified organism: Secondary | ICD-10-CM | POA: Diagnosis not present

## 2022-12-01 DIAGNOSIS — C786 Secondary malignant neoplasm of retroperitoneum and peritoneum: Secondary | ICD-10-CM | POA: Diagnosis present

## 2022-12-01 DIAGNOSIS — N390 Urinary tract infection, site not specified: Secondary | ICD-10-CM | POA: Diagnosis present

## 2022-12-01 DIAGNOSIS — E440 Moderate protein-calorie malnutrition: Secondary | ICD-10-CM | POA: Insufficient documentation

## 2022-12-01 DIAGNOSIS — Z931 Gastrostomy status: Secondary | ICD-10-CM | POA: Diagnosis not present

## 2022-12-01 DIAGNOSIS — R590 Localized enlarged lymph nodes: Secondary | ICD-10-CM | POA: Diagnosis present

## 2022-12-01 DIAGNOSIS — R3981 Functional urinary incontinence: Secondary | ICD-10-CM | POA: Diagnosis present

## 2022-12-01 DIAGNOSIS — Z1152 Encounter for screening for COVID-19: Secondary | ICD-10-CM | POA: Diagnosis not present

## 2022-12-01 DIAGNOSIS — J189 Pneumonia, unspecified organism: Secondary | ICD-10-CM | POA: Diagnosis present

## 2022-12-01 DIAGNOSIS — J3802 Paralysis of vocal cords and larynx, bilateral: Secondary | ICD-10-CM | POA: Diagnosis present

## 2022-12-01 DIAGNOSIS — J9621 Acute and chronic respiratory failure with hypoxia: Secondary | ICD-10-CM | POA: Diagnosis present

## 2022-12-01 DIAGNOSIS — R531 Weakness: Secondary | ICD-10-CM | POA: Diagnosis not present

## 2022-12-01 DIAGNOSIS — Z515 Encounter for palliative care: Secondary | ICD-10-CM | POA: Diagnosis not present

## 2022-12-01 DIAGNOSIS — J9611 Chronic respiratory failure with hypoxia: Secondary | ICD-10-CM

## 2022-12-01 DIAGNOSIS — R569 Unspecified convulsions: Secondary | ICD-10-CM | POA: Diagnosis not present

## 2022-12-01 DIAGNOSIS — Z66 Do not resuscitate: Secondary | ICD-10-CM | POA: Diagnosis present

## 2022-12-01 DIAGNOSIS — Z930 Tracheostomy status: Secondary | ICD-10-CM | POA: Diagnosis not present

## 2022-12-01 DIAGNOSIS — R180 Malignant ascites: Secondary | ICD-10-CM | POA: Diagnosis present

## 2022-12-01 DIAGNOSIS — I10 Essential (primary) hypertension: Secondary | ICD-10-CM | POA: Diagnosis present

## 2022-12-01 DIAGNOSIS — E890 Postprocedural hypothyroidism: Secondary | ICD-10-CM | POA: Diagnosis present

## 2022-12-01 DIAGNOSIS — J690 Pneumonitis due to inhalation of food and vomit: Secondary | ICD-10-CM | POA: Diagnosis present

## 2022-12-01 DIAGNOSIS — C50912 Malignant neoplasm of unspecified site of left female breast: Secondary | ICD-10-CM | POA: Diagnosis present

## 2022-12-01 DIAGNOSIS — I69891 Dysphagia following other cerebrovascular disease: Secondary | ICD-10-CM | POA: Diagnosis not present

## 2022-12-01 DIAGNOSIS — B964 Proteus (mirabilis) (morganii) as the cause of diseases classified elsewhere: Secondary | ICD-10-CM | POA: Diagnosis present

## 2022-12-01 DIAGNOSIS — D259 Leiomyoma of uterus, unspecified: Secondary | ICD-10-CM | POA: Diagnosis present

## 2022-12-01 DIAGNOSIS — G40909 Epilepsy, unspecified, not intractable, without status epilepticus: Secondary | ICD-10-CM | POA: Diagnosis present

## 2022-12-01 DIAGNOSIS — C771 Secondary and unspecified malignant neoplasm of intrathoracic lymph nodes: Secondary | ICD-10-CM | POA: Diagnosis not present

## 2022-12-01 DIAGNOSIS — R7303 Prediabetes: Secondary | ICD-10-CM | POA: Diagnosis present

## 2022-12-01 DIAGNOSIS — G9341 Metabolic encephalopathy: Secondary | ICD-10-CM | POA: Diagnosis not present

## 2022-12-01 DIAGNOSIS — R1310 Dysphagia, unspecified: Secondary | ICD-10-CM | POA: Diagnosis present

## 2022-12-01 DIAGNOSIS — Z7189 Other specified counseling: Secondary | ICD-10-CM | POA: Diagnosis not present

## 2022-12-01 LAB — FOLATE: Folate: 10 ng/mL (ref >5.9)

## 2022-12-01 LAB — BASIC METABOLIC PANEL
Anion gap: 10 (ref 5–15)
BUN: 15 mg/dL (ref 8–23)
CO2: 30 mmol/L (ref 22–32)
Calcium: 8.7 mg/dL — ABNORMAL LOW (ref 8.9–10.3)
Chloride: 99 mmol/L (ref 98–111)
Creatinine, Ser: 0.63 mg/dL (ref 0.44–1.00)
GFR, Estimated: >60 mL/min (ref >60)
Glucose, Bld: 169 mg/dL — ABNORMAL HIGH (ref 70–99)
Potassium: 3.7 mmol/L (ref 3.5–5.1)
Sodium: 139 mmol/L (ref 135–145)

## 2022-12-01 LAB — CBC
HCT: 38.7 % (ref 36.0–46.0)
Hemoglobin: 12 g/dL (ref 12.0–15.0)
MCH: 25.1 pg — ABNORMAL LOW (ref 26.0–34.0)
MCHC: 31 g/dL (ref 30.0–36.0)
MCV: 80.8 fL (ref 80.0–100.0)
Platelets: 197 10*3/uL (ref 150–400)
RBC: 4.79 MIL/uL (ref 3.87–5.11)
RDW: 14.6 % (ref 11.5–15.5)
WBC: 9.5 10*3/uL (ref 4.0–10.5)
nRBC: 0 % (ref 0.0–0.2)

## 2022-12-01 LAB — TROPONIN I (HIGH SENSITIVITY)
Troponin I (High Sensitivity): 3 ng/L (ref <18)
Troponin I (High Sensitivity): 4 ng/L (ref <18)

## 2022-12-01 LAB — HIV ANTIBODY (ROUTINE TESTING W REFLEX): HIV Screen 4th Generation wRfx: NONREACTIVE

## 2022-12-01 LAB — PROCALCITONIN: Procalcitonin: <0.1 ng/mL

## 2022-12-01 LAB — TSH: TSH: 8.457 u[IU]/mL — ABNORMAL HIGH (ref 0.350–4.500)

## 2022-12-01 LAB — VITAMIN B12: Vitamin B-12: 1809 pg/mL — ABNORMAL HIGH (ref 180–914)

## 2022-12-01 LAB — T4, FREE: Free T4: 0.86 ng/dL (ref 0.61–1.12)

## 2022-12-01 MED ORDER — IOHEXOL 350 MG/ML SOLN
80.0000 mL | Freq: Once | INTRAVENOUS | Status: AC | PRN
  Administered 2022-12-01: 75 mL via INTRAVENOUS

## 2022-12-01 MED ORDER — POTASSIUM CHLORIDE IN NACL 20-0.9 MEQ/L-% IV SOLN: INTRAVENOUS | Status: DC

## 2022-12-01 NOTE — Progress Notes (Signed)
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  At baseline she is reportedly ambulatory.  She had been experiencing generalized weakness for the past several days and there was a concern that she had a UTI.  There was no fever or chills abdominal pain or chest pain or shortness of breath.    She wears a depends at baseline.  Her G-tube was recently replaced.  All nutrition and medications are given through G-tube.  She was worked up in the ED and discharged back to [facility].  She returned to ED 11/30/2022 with fever of 101.2, persistent weakness shortness of breath emesis and nausea.  They reported that patient has had increasing tracheal secretions, wheezing and coughing.  She has not been able to ambulate which is not her baseline.  Her workup in the ED was suspicious for a pneumonia and she was started on ceftriaxone and azithromycin and admitted for further evaluation and treatment.   Assessment/Plan:  Generalized weakness -Multifactorial including pneumonia, UTI, deconditioning -Continue antibiotics -PT evaluation -UA>50 WBC -B12 -Folic acid -TSH  Lobar pneumonia -Personally reviewed chest x-ray--left retrocardiac opacity -Continue ceftriaxone azithromycin -MRSA screen negative -PCT 0.11 -Viral respiratory panel  Pyuria -Continue ceftriaxone pending culture data -UA>50 WBC  Chest pain -personally reviewed EKG--sinus,  nonspecific TWI -cycle troponin -CTA chest  Chronic respiratory failure hypoxia -chronically on 4L trach collar blow by  Left breast cancer-stage IIIb -Patient follows up with Dr. Ellin Saba - Continue Faslodex monthly   Dysphagia -pt uses PEG for all nutrition and medication -dietitian consulted to restart tube feeds and water irrigation and flushes   Seizure disorder -resume keppra  Hypothyroidism -resume home levothyroxine    Essential Hypertension  -resume BP medications when reconciled    Mood Disorder -resume home medications  Hypermetabolism in the left central uterus:  -she has seen Dr. Despina Hidden -unable to perform EMBx due to fibroids -did not feel further eval with surgical intervention is warranted give she is not bleeding and is on aromatase inhibitor      Family Communication:   Family at bedside  Consultants:    Code Status:  FULL / DNR  DVT Prophylaxis:  [place] Heparin    Procedures: As Listed in Progress Note Above  Antibiotics: Ceftriaxone 9/3>> Azithro 9/3>>    Subjective:  She denies any shortness of breath.  She has some substernal chest pain.  She denies any nausea vomiting or diarrhea, abdominal pain. Objective: Vitals:   11/30/22 2301 12/01/22 0421 12/01/22 0450 12/01/22 0721  BP:   131/78   Pulse:  (!) 101 (!) 102 89  Resp:  18 18 17   Temp:   97.8 F (36.6 C)   TempSrc:   Oral   SpO2: 93% 95% 96% 96%  Weight:      Height:        Intake/Output Summary (Last 24 hours) at 12/01/2022 0856 Last data filed at 11/30/2022 1201 Gross per 24 hour  Intake 90.86 ml  Output --  Net 90.86 ml  Weight change:  Exam:  General:  Pt is alert, follows commands appropriately, not in acute distress HEENT: No icterus, No thrush, No neck mass, [place]/AT Cardiovascular: RRR, S1/S2, no rubs, no gallops Respiratory: Bibasilar rales.  No wheezing.  Good air movement Abdomen: Soft/+BS, non tender, non distended, no guarding Extremities: Trace lower  extremity edema, No lymphangitis, No petechiae, No rashes, no synovitis   Data Reviewed: I have personally reviewed following labs and imaging studies Basic Metabolic Panel: Recent Labs  Lab 11/26/22 0902 11/29/22 1050 11/30/22 1000 12/01/22 0446  NA 136 136 137 139  K 3.7 3.9 3.6 3.7  CL 99 99 97* 99  CO2 29 30 31 30   GLUCOSE 192* 212* 206* 169*  BUN 17 18 18 15   CREATININE 0.64 0.77 0.71 0.63  CALCIUM 8.9 8.7* 8.9 8.7*   Liver Function Tests: Recent Labs  Lab 11/26/22 0902 11/30/22 1000  AST 31 34  ALT 14 16  ALKPHOS 61 69  BILITOT 0.5 0.6  PROT 7.8 7.5  ALBUMIN 3.6 3.5   No results for input(s): "LIPASE", "AMYLASE" in the last 168 hours. No results for input(s): "AMMONIA" in the last 168 hours. Coagulation Profile: No results for input(s): "INR", "PROTIME" in the last 168 hours. CBC: Recent Labs  Lab 11/26/22 0902 11/29/22 1050 11/30/22 1000 12/01/22 0446  WBC 5.8 6.7 7.8 9.5  NEUTROABS 3.9 5.0 6.2  --   HGB 12.8 12.6 13.0 12.0  HCT 40.6 40.6 41.8 38.7  MCV 81.0 81.4 81.0 80.8  PLT 219 229 225 197   Cardiac Enzymes: No results for input(s): "CKTOTAL", "CKMB", "CKMBINDEX", "TROPONINI" in the last 168 hours. BNP: Invalid input(s): "POCBNP" CBG: No results for input(s): "GLUCAP" in the last 168 hours. HbA1C: Recent Labs    11/30/22 1000  HGBA1C 6.6*   Urine analysis:    Component Value Date/Time   COLORURINE AMBER (A) 11/30/2022 1030   APPEARANCEUR CLOUDY (A) 11/30/2022 1030   LABSPEC 1.024 11/30/2022 1030   PHURINE 7.0 11/30/2022 1030   GLUCOSEU NEGATIVE 11/30/2022 1030   HGBUR SMALL (A) 11/30/2022 1030   BILIRUBINUR NEGATIVE 11/30/2022 1030   KETONESUR NEGATIVE 11/30/2022 1030   PROTEINUR 100 (A) 11/30/2022 1030   NITRITE NEGATIVE 11/30/2022 1030   LEUKOCYTESUR LARGE (A) 11/30/2022 1030   Sepsis Labs: @LABRCNTIP (procalcitonin:4,lacticidven:4) ) Recent Results (from the past 240 hour(s))  Urine Culture     Status: None   Collection  Time: 11/29/22  9:57 AM   Specimen: Urine, Catheterized  Result Value Ref Range Status   Specimen Description   Final    URINE, CATHETERIZED Performed at [facility], [address] [place], [place] [address]    Special Requests   Final    NONE Performed at [facility], [address] [place], [place] [address]    Culture   Final    NO GROWTH Performed at [facility], [address], [place], [place] [address]    Report Status 11/30/2022 FINAL  Final  Culture, blood (routine x 2)     Status: None (Preliminary result)   Collection Time: 11/30/22 10:00 AM   Specimen: BLOOD  Result Value Ref Range Status   Specimen Description BLOOD RIGHT ASSIST CONTROL  Final   Special Requests   Final    BOTTLES DRAWN AEROBIC AND ANAEROBIC Blood Culture results may not be optimal due to an excessive volume of blood received in culture bottles   Culture   Final    NO GROWTH < 24 HOURS Performed  at [facility], [address], [place], [place] [address]    Report Status PENDING  Incomplete  SARS Coronavirus 2 by RT PCR (hospital order, performed in [facility] hospital lab) *cepheid single result test* Anterior Nasal Swab     Status: None   Collection Time: 11/30/22 10:10 AM   Specimen: Anterior Nasal Swab  Result Value Ref Range Status   SARS Coronavirus 2 by RT PCR NEGATIVE NEGATIVE Final    Comment: (NOTE) SARS-CoV-2 target nucleic acids are NOT DETECTED.  The SARS-CoV-2 RNA is generally detectable in upper and lower respiratory specimens during the acute phase of infection. The lowest concentration of SARS-CoV-2 viral copies this assay can detect is 250 copies / mL. A negative result does not preclude SARS-CoV-2 infection and should not be used as the sole basis for treatment or other patient management decisions.  A negative result may occur with improper specimen collection / handling, submission of specimen other than nasopharyngeal swab, presence of viral mutation(s) within  the areas targeted by this assay, and inadequate number of viral copies (<250 copies / mL). A negative result must be combined with clinical observations, patient history, and epidemiological information.  Fact Sheet for Patients:   RoadLapTop.co.za  Fact Sheet for Healthcare Providers: http://kim-miller.com/  This test is not yet approved or  cleared by the [place] FDA and has been authorized for detection and/or diagnosis of SARS-CoV-2 by FDA under an Emergency Use Authorization (EUA).  This EUA will remain in effect (meaning this test can be used) for the duration of the COVID-19 declaration under Section 564(b)(1) of the Act, 21 U.S.C. section 360bbb-3(b)(1), unless the authorization is terminated or revoked sooner.  Performed at [facility], [address], [place], [place] [address]   Culture, blood (routine x 2)     Status: None (Preliminary result)   Collection Time: 11/30/22 10:57 AM   Specimen: BLOOD  Result Value Ref Range Status   Specimen Description BLOOD BLOOD LEFT HAND  Final   Special Requests   Final    BOTTLES DRAWN AEROBIC AND ANAEROBIC Blood Culture adequate volume   Culture   Final    NO GROWTH < 24 HOURS Performed at [facility], [address] [place], [place] [address]    Report Status PENDING  Incomplete  MRSA Next Gen by PCR, Nasal     Status: None   Collection Time: 11/30/22  5:45 PM   Specimen: Nasal Mucosa; Nasal Swab  Result Value Ref Range Status   MRSA by PCR Next Gen NOT DETECTED NOT DETECTED Final    Comment: (NOTE) The GeneXpert MRSA Assay (FDA approved for NASAL specimens only), is one component of a comprehensive MRSA colonization surveillance program. It is not intended to diagnose MRSA infection nor to guide or monitor treatment for MRSA infections. Test performance is not FDA approved in patients less than 63 years old. Performed at [facility], [address] [place], [place]  [address]      Scheduled Meds:  ALPRAZolam  0.5 mg Per J Tube BID   amLODipine  5 mg Per Tube Daily   chlorhexidine  15 mL Mouth Rinse BID   DULoxetine  20 mg Oral Daily   feeding supplement (OSMOLITE 1.2 CAL)  355 mL Per Tube 5 X Daily   fluticasone  1 spray Each Nare BID   free water  150 mL Per Tube Q6H   glycopyrrolate  1 mg Per Tube BID   guaiFENesin  10 mL Per Tube  Q8H   heparin  5,000 Units Subcutaneous Q8H   influenza vaccine adjuvanted  0.5 mL Intramuscular Tomorrow-1000   ipratropium-albuterol  3 mL Nebulization Q4H   levETIRAcetam  750 mg Per Tube BID   levothyroxine  100 mcg Per Tube Q0600   melatonin  3 mg Per Tube QHS   pantoprazole  40 mg Oral Q0600   polyethylene glycol  17 g Per Tube BID   QUEtiapine  50 mg Per Tube BID   scopolamine  1 patch Transdermal Q72H   senna  1 tablet Per Tube Daily   sodium chloride  3 mL Nebulization QID   Continuous Infusions:  azithromycin     cefTRIAXone (ROCEPHIN)  IV     lactated ringers 50 mL/hr at 11/30/22 1324    Procedures/Studies: DG Chest Portable 1 View  Result Date: 11/30/2022 CLINICAL DATA:  Cough.  History of breast cancer. EXAM: PORTABLE CHEST 1 VIEW COMPARISON:  None Available. FINDINGS: Film is rotated to the left. Tracheostomy tube in place. Presumed vertically oriented catheter for VP shunt along the right hemithorax. Normal cardiopericardial silhouette when adjusting for technique. No pneumothorax or edema. Question left retrocardiac opacity versus related to the rotation. Overlapping cardiac leads. IMPRESSION: Tracheostomy tube.  Presumed VP shunt. Rotated radiograph to the left with some left retrocardiac opacity. Subtle infiltrate or effusion is possible. No pneumothorax or edema. Electronically Signed   By: Karen Kays M.D.   On: 11/30/2022 12:23   DG Abd 2 Views  Result Date: 11/29/2022 CLINICAL DATA:  Abdominal distention. EXAM: ABDOMEN - 2 VIEW COMPARISON:  PET-CT dated 08/26/2022. FINDINGS: The bowel gas  pattern is normal. There is no evidence of free air. A gastrostomy tube is noted. A catheter overlies the right chest, right abdomen and left abdomen, consistent with a ventriculoperitoneal catheter. A calcified fibroid is seen in the pelvis. Degenerative changes are seen in the spine. IMPRESSION: Nonobstructive bowel gas pattern. Electronically Signed   By: Romona Curls M.D.   On: 11/29/2022 12:40    Catarina Hartshorn, DO  [facility]  If 7PM-7AM, please contact night-coverage [email or web address] Password [email or web address] 12/01/2022, 8:56 AM   LOS: 0 days

## 2022-12-01 NOTE — Progress Notes (Signed)
MEWS Progress Note  Patient Details Name: Stacy Moran MRN: [id number] DOB: 06/26/1955 Today's Date: 12/01/2022   MEWS Flowsheet Documentation:  Assess: MEWS Score Temp: 99.4 F (37.4 C) BP: (!) 144/93 MAP (mmHg): 107 Pulse Rate: (!) 107 ECG Heart Rate: 92 Resp: 18 Level of Consciousness: Alert SpO2: 96 % O2 Device: Tracheostomy Collar Patient Activity (if Appropriate): In bed O2 Flow Rate (L/min): 8 L/min FiO2 (%): 35 % Assess: MEWS Score MEWS Temp: 0 MEWS Systolic: 0 MEWS Pulse: 1 MEWS RR: 0 MEWS LOC: 0 MEWS Score: 1 MEWS Score Color: Green Assess: SIRS CRITERIA SIRS Temperature : 0 SIRS Respirations : 0 SIRS Pulse: 1 SIRS WBC: 0 SIRS Score Sum : 1 SIRS Temperature : 0 SIRS Pulse: 1 SIRS Respirations : 0 SIRS WBC: 0 SIRS Score Sum : 1 Assess: if the MEWS score is Yellow or Red Were vital signs accurate and taken at a resting state?: Yes Does the patient meet 2 or more of the SIRS criteria?: No MEWS guidelines implemented : Yes, yellow Treat MEWS Interventions: Considered administering scheduled or prn medications/treatments as ordered Take Vital Signs Increase Vital Sign Frequency : Yellow: Q2hr x1, continue Q4hrs until patient remains green for 12hrs Escalate MEWS: Escalate: Yellow: Discuss with charge nurse and consider notifying provider and/or RRT     MD at bedside new orders placed for CT abd with contrast and chest xray.    Stacy Moran 12/01/2022, 2:36 PM

## 2022-12-01 NOTE — TOC Progression Note (Signed)
Transition of Care [facility]) - Progression Note    Patient Details  Name: Stacy Moran MRN: [id number] Date of Birth: July 24, 1955  Transition of Care [facility]) CM/SW Contact  Leitha Bleak, RN Phone Number: 12/01/2022, 2:34 PM  Clinical Narrative:   [facility] consulted for new placement. CM spoke with her daughter. Patient has been at [facility] 2 plus years. Daughter feel like she needs more care with trach and peg. TOC explained we can sent out but with LTC beds and medicaid it is a process to get her changed. She is aware as she has moved her in the past from a facility in [place].  She has been with with [facility]. Daughter wants [facility]. CM spoke with Irving Burton at [facility] to review the chart. TOC following.    Addendum - Faxed referral with H&P to Lonell Grandchild- [facility] SN[facility] admissions. .   Expected Discharge Plan: Long Term Nursing Home Barriers to Discharge: Continued Medical Work up  Expected Discharge Plan and Services In-house Referral: Clinical Social Work Discharge Planning Services: CM Consult Post Acute Care Choice: Nursing Home Living arrangements for the past 2 months: Skilled Nursing Facility                   Social Determinants of Health (SDOH) Interventions SDOH Screenings   Food Insecurity: No Food Insecurity (11/30/2022)  Housing: Medium Risk (11/30/2022)  Transportation Needs: No Transportation Needs (11/30/2022)  Utilities: Not At Risk (11/30/2022)  Depression (PHQ2-9): Low Risk  (08/08/2020)  Recent Concern: Depression (PHQ2-9) - Medium Risk (06/05/2020)  Tobacco Use: Medium Risk (11/30/2022)    Readmission Risk Interventions    12/31/2020    3:40 PM  Readmission Risk Prevention Plan  Transportation Screening Complete  Medication Review (RN Care Manager) Complete  PCP or Specialist appointment within 3-5 days of discharge Complete  HRI or Home Care Consult Complete  SW Recovery Care/Counseling Consult Complete  Palliative Care Screening Not Applicable  Skilled  Nursing Facility Complete

## 2022-12-01 NOTE — Progress Notes (Signed)
Responded to nursing call:  n/v with sob and desaturation   Subjective: Pt is awake and answers simple yes/no questions.  Denies abd pain, cp, sob  Vitals:   12/01/22 0721 12/01/22 1000 12/01/22 1119 12/01/22 1310  BP:  (!) 144/84  (!) 151/97  Pulse: 89 99  (!) 118  Resp: 17 20    Temp:  (!) 100.4 F (38 C)  98.9 F (37.2 C)  TempSrc:  Axillary  Oral  SpO2: 96% 98% 98% 97%  Weight:      Height:       CV--RRR Lung--bilateral rales Abd--soft+BS/NT   Assessment/Plan: Acute on chronic respiratory failure -9/4 CTA chest--NEG PE; debris in main stem bronchus -due to component of aspiration pneumonitis -now up from 28% to 35% FiO2 with saturation 93-95% -start duoneb  Abdominal distension -ileus vs SBO -CT abd/pelvis with contrast through Gtube -stop enteral feeding -optimize electrolytes -may need NG if emesis continues -TSH 8.457, Free T4--0.86      Catarina Hartshorn, DO [facility]

## 2022-12-01 NOTE — Plan of Care (Signed)
  Problem: Education: Goal: Knowledge of General Education information will improve Description: Including pain rating scale, medication(s)/side effects and non-pharmacologic comfort measures Outcome: Progressing   Problem: Clinical Measurements: Goal: Ability to maintain clinical measurements within normal limits will improve Outcome: Progressing Goal: Will remain free from infection Outcome: Progressing Goal: Cardiovascular complication will be avoided Outcome: Progressing   Problem: Activity: Goal: Risk for activity intolerance will decrease Outcome: Progressing   Problem: Coping: Goal: Level of anxiety will decrease Outcome: Progressing   Problem: Elimination: Goal: Will not experience complications related to bowel motility Outcome: Progressing

## 2022-12-01 NOTE — Progress Notes (Signed)
Initial Nutrition Assessment  DOCUMENTATION CODES:   Non-severe (moderate) malnutrition in context of acute illness/injury  INTERVENTION:   When able to resume TF via PEG, recommend a lower total volume provided by a more concentrated formula:  Osmolite 1.5 275 ml 5 times per day (1375 ml total per day) Free water flushes 200 ml 5 times per day  Provides 2060 kcal, 86 gm protein, 2050 ml free water total daily.  *If ongoing intolerance of PEG feedings, consider conversion of G-tube to GJ-tube.  NUTRITION DIAGNOSIS:   Moderate Malnutrition related to acute illness as evidenced by mild muscle depletion, mild fat depletion.  GOAL:   Patient will meet greater than or equal to 90% of their needs  MONITOR:   TF tolerance, Skin, Labs  REASON FOR ASSESSMENT:   Consult Enteral/tube feeding initiation and management  ASSESSMENT:   67 yo female admitted with generalized weakness, dysuria. PMH includes HTN, SDH, CVA, ruptured aneurysm, seizure disorder, trach dependent, vocal cord paralysis, G-tube for nutrition and medications, PEG recently replaced.  Usual TF via PEG: Osmolite 1.2 355 ml 5 times daily with 150 ml free water flushes every 6 hours. This provides 2130 kcal, 99 gm protein, 2056 ml free water total per day.   TF currently on hold d/t vomiting. CT abd/pelvis has been ordered.   Usual weights reviewed. No significant weight changes noted.   Patient walks around at her nursing facility. She receives all her nutrition via PEG.   RN at bedside reports that patient has had a distended abdomen all day. She vomited after her feeding this morning. RN infused the feeding slowly over one hour using a pump. During RD visit, RN was giving contrast for abdominal CT this afternoon.   Labs reviewed. Folate 10, A1C 6.6, TSH 8.457  CBG: N/A  Medications reviewed and include Miralax, Senokot, IV antibiotics.   NUTRITION - FOCUSED PHYSICAL EXAM:  Flowsheet Row Most Recent Value   Orbital Region Mild depletion  Upper Arm Region Unable to assess  Thoracic and Lumbar Region Mild depletion  Buccal Region No depletion  Temple Region Mild depletion  Clavicle Bone Region Mild depletion  Clavicle and Acromion Bone Region Unable to assess  Scapular Bone Region Unable to assess  Dorsal Hand Moderate depletion  Patellar Region Mild depletion  Anterior Thigh Region Mild depletion  Posterior Calf Region Mild depletion  Edema (RD Assessment) None  Hair Unable to assess  Eyes Reviewed  Mouth Reviewed  Skin Reviewed  Nails Reviewed       Diet Order:   Diet Order             Diet NPO time specified  Diet effective now                   EDUCATION NEEDS:   No education needs have been identified at this time  Skin:  Skin Assessment: Reviewed RN Assessment  Last BM:  9/4 type 7 smear  Height:   Ht Readings from Last 1 Encounters:  11/30/22 5\' 6"  (1.676 m)    Weight:   Wt Readings from Last 1 Encounters:  11/30/22 72.5 kg    Ideal Body Weight:  59.1 kg  BMI:  Body mass index is 25.8 kg/m.  Estimated Nutritional Needs:   Kcal:  1800-2000  Protein:  80-100 gm  Fluid:  >/= 1.8 L   Gabriel Rainwater RD, LDN, CNSC Please refer to Amion for contact information.

## 2022-12-02 ENCOUNTER — Inpatient Hospital Stay (HOSPITAL_COMMUNITY): Payer: Medicaid Other

## 2022-12-02 DIAGNOSIS — R531 Weakness: Secondary | ICD-10-CM

## 2022-12-02 DIAGNOSIS — G9341 Metabolic encephalopathy: Secondary | ICD-10-CM | POA: Diagnosis not present

## 2022-12-02 DIAGNOSIS — C771 Secondary and unspecified malignant neoplasm of intrathoracic lymph nodes: Secondary | ICD-10-CM

## 2022-12-02 DIAGNOSIS — Z930 Tracheostomy status: Secondary | ICD-10-CM

## 2022-12-02 DIAGNOSIS — J189 Pneumonia, unspecified organism: Secondary | ICD-10-CM

## 2022-12-02 DIAGNOSIS — C50912 Malignant neoplasm of unspecified site of left female breast: Secondary | ICD-10-CM

## 2022-12-02 DIAGNOSIS — Z515 Encounter for palliative care: Secondary | ICD-10-CM | POA: Diagnosis not present

## 2022-12-02 DIAGNOSIS — J9621 Acute and chronic respiratory failure with hypoxia: Secondary | ICD-10-CM

## 2022-12-02 DIAGNOSIS — Z7189 Other specified counseling: Secondary | ICD-10-CM | POA: Diagnosis not present

## 2022-12-02 DIAGNOSIS — J690 Pneumonitis due to inhalation of food and vomit: Secondary | ICD-10-CM | POA: Insufficient documentation

## 2022-12-02 LAB — URINE CULTURE: Culture: >=100000 — AB

## 2022-12-02 LAB — BASIC METABOLIC PANEL
Anion gap: 11 (ref 5–15)
BUN: 7 mg/dL — ABNORMAL LOW (ref 8–23)
CO2: 27 mmol/L (ref 22–32)
Calcium: 8.6 mg/dL — ABNORMAL LOW (ref 8.9–10.3)
Chloride: 103 mmol/L (ref 98–111)
Creatinine, Ser: 0.55 mg/dL (ref 0.44–1.00)
GFR, Estimated: >60 mL/min (ref >60)
Glucose, Bld: 111 mg/dL — ABNORMAL HIGH (ref 70–99)
Potassium: 4.1 mmol/L (ref 3.5–5.1)
Sodium: 141 mmol/L (ref 135–145)

## 2022-12-02 LAB — BRAIN NATRIURETIC PEPTIDE: B Natriuretic Peptide: 99 pg/mL (ref 0.0–100.0)

## 2022-12-02 LAB — CBC
HCT: 42.4 % (ref 36.0–46.0)
Hemoglobin: 12.7 g/dL (ref 12.0–15.0)
MCH: 24.8 pg — ABNORMAL LOW (ref 26.0–34.0)
MCHC: 30 g/dL (ref 30.0–36.0)
MCV: 82.8 fL (ref 80.0–100.0)
Platelets: 183 10*3/uL (ref 150–400)
RBC: 5.12 MIL/uL — ABNORMAL HIGH (ref 3.87–5.11)
RDW: 14.4 % (ref 11.5–15.5)
WBC: 8.1 10*3/uL (ref 4.0–10.5)
nRBC: 0 % (ref 0.0–0.2)

## 2022-12-02 LAB — MAGNESIUM: Magnesium: 2.5 mg/dL — ABNORMAL HIGH (ref 1.7–2.4)

## 2022-12-02 LAB — BLOOD GAS, VENOUS
Acid-Base Excess: 6.3 mmol/L — ABNORMAL HIGH (ref 0.0–2.0)
Bicarbonate: 32.3 mmol/L — ABNORMAL HIGH (ref 20.0–28.0)
Drawn by: 66297
O2 Saturation: 71.4 %
Patient temperature: 37.5
pCO2, Ven: 52 mmHg (ref 44–60)
pH, Ven: 7.4 (ref 7.25–7.43)
pO2, Ven: 41 mmHg (ref 32–45)

## 2022-12-02 LAB — AMMONIA: Ammonia: 26 umol/L (ref 9–35)

## 2022-12-02 MED ORDER — FAMOTIDINE 20 MG PO TABS
20.0000 mg | ORAL_TABLET | Freq: Every day | ORAL | Status: DC
  Administered 2022-12-03 – 2022-12-05 (×3): 20 mg
  Filled 2022-12-02 (×3): qty 1

## 2022-12-02 MED ORDER — SODIUM CHLORIDE 0.9 % IN NEBU
3.0000 mL | INHALATION_SOLUTION | Freq: Two times a day (BID) | RESPIRATORY_TRACT | Status: DC
  Administered 2022-12-03 – 2022-12-04 (×3): 3 mL via RESPIRATORY_TRACT
  Filled 2022-12-02 (×3): qty 3

## 2022-12-02 NOTE — Progress Notes (Addendum)
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  At baseline she is reportedly ambulatory.  She had been experiencing generalized weakness for the past several days and there was a concern that she had a UTI.  There was no fever or chills abdominal pain or chest pain or shortness of breath.    She wears a depends at baseline.  Her G-tube was recently replaced.  All nutrition and medications are given through G-tube.  She was worked up in the ED and discharged back to [facility].  She returned to ED 11/30/2022 with fever of 101.2, persistent weakness shortness of breath emesis and nausea.  They reported that patient has had increasing tracheal secretions, wheezing and coughing.  She has not been able to ambulate which is not her baseline.  Her workup in the ED was suspicious for a pneumonia and she was started on ceftriaxone and azithromycin and admitted for further evaluation and treatment.   Assessment/Plan:  Acute respiratory on chronic failure with hypoxia  -due to aspiration pneumonitis -chronically on 4L trach collar blow by -now up to 10L -9/4 CTA chest--neg for PE;  progression of metastatic disease with worsening left axillary, retropectoral, supraclavicular, mediastinal and internal mammary lymphadenopathy -check VBG   Generalized weakness -Multifactorial including pneumonia, UTI, deconditioning -Continue antibiotics -PT evaluation  when stable -UA>50 WBC -B12--1809 -Folic acid--10.0 -TSH--8.457   Lobar pneumonia/Aspiration pneumonitis -Personally reviewed chest x-ray--left retrocardiac opacity -Continue ceftriaxone azithromycin -MRSA screen negative -PCT 0.11 -holding enteral feeding again today; plan to restart after paracentesis  Peritoneal carcinomatosis -12/01/22 CT AP--moderate ascites; no bowel obstruction; new peritoneal implants consistent with peritoneal metastatic disease; new gastrohepatic ligament lymph node -request paracentesis   UTI--Proteus -Continue ceftriaxone -UA>50 WBC  Acute metabolic encephalopathy -more somnolent -multifactorial including infection, resp failure -MR brain to r/o mets  -check ammonia  Chest pain -personally reviewed EKG--sinus, nonspecific TWI -cycle troponin 3>>4 -CTA chest--neg PE   Left breast cancer-stage IIIb -Patient follows up with Dr. Ellin Saba - Continue Faslodex monthly  -consulted Dr. Ellin Saba   Dysphagia -pt uses PEG for all nutrition and medication -dietitian consulted to restart tube feeds and water irrigation and flushes   Seizure disorder -resume keppra   Hypothyroidism -resume home levothyroxine    Essential Hypertension  -resume BP medications when reconciled    Mood Disorder -resume home medications   Hypermetabolism in the left central uterus:  -she has seen Dr. Despina Hidden -unable to perform EMBx due to fibroids -did not feel further eval with surgical intervention is warranted give she is not bleeding and is on aromatase inhibitor  Moderate malnutrition -restart supplements when able to tolerate           Family Communication:   daughter updated 9/5   Consultants:  med/onc, palliative   Code Status:  FULL    DVT Prophylaxis:  [facility] Heparin      Procedures: As Listed in Progress Note Above   Antibiotics: Ceftriaxone 9/3>> Azithro 9/3>>        Subjective:  Pt is somnolent but awakens to voice.  Follows one  step commands.  Denies cp, abd pain.  Complains of some sob  Objective: Vitals:   12/02/22 1146 12/02/22 1205 12/02/22 1300 12/02/22 1501  BP: 109/70  128/74   Pulse: 88  96   Resp: 18  19   Temp:   98.7 F (37.1 C)   TempSrc:   Oral   SpO2: 99% 99% 98% 96%  Weight:      Height:        Intake/Output Summary (Last 24 hours) at 12/02/2022 1508 Last data filed at 12/02/2022 1300 Gross per 24 hour  Intake 853.79 ml  Output 650 ml  Net 203.79 ml   Weight change:  Exam:  General:  Pt is alert, follows commands appropriately, not in acute distress HEENT: No icterus, No thrush, No neck mass, [place]/AT Cardiovascular: RRR, S1/S2, no rubs, no gallops Respiratory: bilateral rhonchi Abdomen: Soft/+BS, non tender, non distended, no guarding Extremities: No edema, No lymphangitis, No petechiae, No rashes, no synovitis   Data Reviewed: I have personally reviewed following labs and imaging studies Basic Metabolic Panel: Recent Labs  Lab 11/26/22 0902 11/29/22 1050 11/30/22 1000 12/01/22 0446 12/02/22 0441  NA 136 136 137 139 141  K 3.7 3.9 3.6 3.7 4.1  CL 99 99 97* 99 103  CO2 29 30 31 30 27   GLUCOSE 192* 212* 206* 169* 111*  BUN 17 18 18 15  7*  CREATININE 0.64 0.77 0.71 0.63 0.55  CALCIUM 8.9 8.7* 8.9 8.7* 8.6*  MG  --   --   --   --  2.5*   Liver Function Tests: Recent Labs  Lab 11/26/22 0902 11/30/22 1000  AST 31 34  ALT 14 16  ALKPHOS 61 69  BILITOT 0.5 0.6  PROT 7.8 7.5  ALBUMIN 3.6 3.5   No results for input(s): "LIPASE", "AMYLASE" in the last 168 hours. No results for input(s): "AMMONIA" in the last 168 hours. Coagulation Profile: No results for input(s): "INR", "PROTIME" in the last 168 hours. CBC: Recent Labs  Lab 11/26/22 0902 11/29/22 1050 11/30/22 1000 12/01/22 0446 12/02/22 0441  WBC 5.8 6.7 7.8 9.5 8.1  NEUTROABS 3.9 5.0 6.2  --   --   HGB 12.8 12.6 13.0 12.0 12.7  HCT 40.6 40.6 41.8 38.7 42.4  MCV 81.0 81.4 81.0 80.8 82.8  PLT 219 229 225 197  183   Cardiac Enzymes: No results for input(s): "CKTOTAL", "CKMB", "CKMBINDEX", "TROPONINI" in the last 168 hours. BNP: Invalid input(s): "POCBNP" CBG: No results for input(s): "GLUCAP" in the last 168 hours. HbA1C: Recent Labs    11/30/22 1000  HGBA1C 6.6*   Urine analysis:    Component Value Date/Time   COLORURINE AMBER (A) 11/30/2022 1030   APPEARANCEUR CLOUDY (A) 11/30/2022 1030   LABSPEC 1.024 11/30/2022 1030   PHURINE 7.0 11/30/2022 1030   GLUCOSEU NEGATIVE 11/30/2022 1030   HGBUR SMALL (A) 11/30/2022 1030   BILIRUBINUR NEGATIVE 11/30/2022 1030   KETONESUR NEGATIVE 11/30/2022 1030   PROTEINUR 100 (A) 11/30/2022 1030   NITRITE NEGATIVE 11/30/2022 1030   LEUKOCYTESUR LARGE (A) 11/30/2022 1030   Sepsis Labs: @LABRCNTIP (procalcitonin:4,lacticidven:4) ) Recent Results (from the past 240 hour(s))  Urine Culture     Status: None   Collection Time: 11/29/22  9:57 AM   Specimen: Urine, Catheterized  Result Value Ref Range Status   Specimen Description   Final    URINE, CATHETERIZED Performed at [facility], [address], [place], [place]  [address]    Special Requests   Final    NONE Performed at [facility], [address], [place], [place] [address]    Culture   Final    NO GROWTH Performed at [facility], [address], [place], [place] [address]    Report Status 11/30/2022 FINAL  Final  Culture, blood (routine x 2)     Status: None (Preliminary result)   Collection Time: 11/30/22 10:00 AM   Specimen: BLOOD  Result Value Ref Range Status   Specimen Description BLOOD RIGHT ASSIST CONTROL  Final   Special Requests   Final    BOTTLES DRAWN AEROBIC AND ANAEROBIC Blood Culture results may not be optimal due to an excessive volume of blood received in culture bottles   Culture   Final    NO GROWTH 2 DAYS Performed at [facility], [address] [place], [place] [address]    Report Status PENDING  Incomplete  SARS Coronavirus 2 by RT PCR (hospital  order, performed in [facility] hospital lab) *cepheid single result test* Anterior Nasal Swab     Status: None   Collection Time: 11/30/22 10:10 AM   Specimen: Anterior Nasal Swab  Result Value Ref Range Status   SARS Coronavirus 2 by RT PCR NEGATIVE NEGATIVE Final    Comment: (NOTE) SARS-CoV-2 target nucleic acids are NOT DETECTED.  The SARS-CoV-2 RNA is generally detectable in upper and lower respiratory specimens during the acute phase of infection. The lowest concentration of SARS-CoV-2 viral copies this assay can detect is 250 copies / mL. A negative result does not preclude SARS-CoV-2 infection and should not be used as the sole basis for treatment or other patient management decisions.  A negative result may occur with improper specimen collection / handling, submission of specimen other than nasopharyngeal swab, presence of viral mutation(s) within the areas targeted by this assay, and inadequate number of viral copies (<250 copies / mL). A negative result must be combined with clinical observations, patient history, and epidemiological information.  Fact Sheet for Patients:   RoadLapTop.co.za  Fact Sheet for Healthcare Providers: http://kim-miller.com/  This test is not yet approved or  cleared by the [place] FDA and has been authorized for detection and/or diagnosis of SARS-CoV-2 by FDA under an Emergency Use Authorization (EUA).  This EUA will remain in effect (meaning this test can be used) for the duration of the COVID-19 declaration under Section 564(b)(1) of the Act, 21 U.S.C. section 360bbb-3(b)(1), unless the authorization is terminated or revoked sooner.  Performed at [facility], [address], [place], [place] [address]   Urine Culture     Status: Abnormal   Collection Time: 11/30/22 10:30 AM   Specimen: Urine, Random  Result Value Ref Range Status   Specimen Description   Final    URINE,  RANDOM Performed at [facility], [address], [place], [place] [address]    Special Requests   Final    NONE Reflexed from [id number] Performed at [facility], [address], [place], [place] [address]    Culture (A)  Final    >=100,000 COLONIES/mL PROTEUS MIRABILIS WITHIN MIXED ORGANISMS Performed at [facility], [address], [place], [place] [address]    Report Status 12/02/2022 FINAL  Final   Organism ID, Bacteria PROTEUS MIRABILIS (A)  Final      Susceptibility   Proteus mirabilis - MIC*    AMPICILLIN <=2 SENSITIVE Sensitive     CEFAZOLIN <=4 SENSITIVE Sensitive  CEFEPIME <=0.12 SENSITIVE Sensitive     CEFTRIAXONE <=0.25 SENSITIVE Sensitive     CIPROFLOXACIN <=0.25 SENSITIVE Sensitive     GENTAMICIN <=1 SENSITIVE Sensitive     IMIPENEM 2 SENSITIVE Sensitive     NITROFURANTOIN 128 RESISTANT Resistant     TRIMETH/SULFA <=20 SENSITIVE Sensitive     AMPICILLIN/SULBACTAM <=2 SENSITIVE Sensitive     PIP/TAZO <=4 SENSITIVE Sensitive     * >=100,000 COLONIES/mL PROTEUS MIRABILIS  Culture, blood (routine x 2)     Status: None (Preliminary result)   Collection Time: 11/30/22 10:57 AM   Specimen: BLOOD  Result Value Ref Range Status   Specimen Description BLOOD BLOOD LEFT HAND  Final   Special Requests   Final    BOTTLES DRAWN AEROBIC AND ANAEROBIC Blood Culture adequate volume   Culture   Final    NO GROWTH 2 DAYS Performed at [facility], [address], [place], [place] [address]    Report Status PENDING  Incomplete  MRSA Next Gen by PCR, Nasal     Status: None   Collection Time: 11/30/22  5:45 PM   Specimen: Nasal Mucosa; Nasal Swab  Result Value Ref Range Status   MRSA by PCR Next Gen NOT DETECTED NOT DETECTED Final    Comment: (NOTE) The GeneXpert MRSA Assay (FDA approved for NASAL specimens only), is one component of a comprehensive MRSA colonization surveillance program. It is not intended to diagnose MRSA infection nor to guide or monitor treatment for  MRSA infections. Test performance is not FDA approved in patients less than 27 years old. Performed at [facility], [address] [place], [place] [address]      Scheduled Meds:  ALPRAZolam  0.5 mg Per J Tube BID   amLODipine  5 mg Per Tube Daily   chlorhexidine  15 mL Mouth Rinse BID   famotidine  20 mg Per Tube Daily   feeding supplement (OSMOLITE 1.2 CAL)  355 mL Per Tube 5 X Daily   fluticasone  1 spray Each Nare BID   free water  150 mL Per Tube Q6H   glycopyrrolate  1 mg Per Tube BID   guaiFENesin  10 mL Per Tube Q8H   heparin  5,000 Units Subcutaneous Q8H   influenza vaccine adjuvanted  0.5 mL Intramuscular Tomorrow-1000   ipratropium-albuterol  3 mL Nebulization Q4H   levETIRAcetam  750 mg Per Tube BID   levothyroxine  100 mcg Per Tube Q0600   melatonin  3 mg Per Tube QHS   polyethylene glycol  17 g Per Tube BID   QUEtiapine  50 mg Per Tube BID   scopolamine  1 patch Transdermal Q72H   senna  1 tablet Per Tube Daily   sodium chloride  3 mL Nebulization QID   Continuous Infusions:  0.9 % NaCl with KCl 20 mEq / L 75 mL/hr at 12/01/22 2014   azithromycin 500 mg (12/02/22 1433)   cefTRIAXone (ROCEPHIN)  IV 1 g (12/02/22 1353)    Procedures/Studies: CT ABDOMEN PELVIS WO CONTRAST  Result Date: 12/02/2022 CLINICAL DATA:  Stage III C left breast cancer with evidence of stage IV disease on CTA chest earlier today with upper abdominal peritoneal implants and progressive adenopathy. Bowel obstruction suspected with abdominal pain and distention. EXAM: CT ABDOMEN AND PELVIS WITHOUT CONTRAST TECHNIQUE: Multidetector CT imaging of the abdomen and pelvis was performed following the standard protocol without IV contrast. RADIATION DOSE REDUCTION: This exam was performed according to the departmental dose-optimization program which includes  automated exposure control, adjustment of the mA and/or kV according to patient size and/or use of iterative reconstruction technique. COMPARISON:   CTA chest today at 9:33 a.m., PET-CT studies dated 08/26/2022 and 06/03/2022. FINDINGS: Lower chest: Again noted are bilateral symmetric minimal small layering pleural effusions with adjacent atelectasis. Lung bases are clear of infiltrates. Mild cardiomegaly. No pericardial effusion is seen. There is two-vessel trace calcific plaque in the LAD and right coronary arteries. Hepatobiliary: The liver is 18 cm length. No focal abnormality is seen without contrast. Gallbladder and bile ducts are unremarkable. Pancreas: Partially atrophic. Otherwise unremarkable without contrast. Spleen: There is homogeneous noncontrast attenuation. No masses seen without contrast. No splenomegaly. Adrenals/Urinary Tract: Slight chronic nodular thickening left adrenal gland. Unremarkable right adrenal gland and unenhanced bilateral kidneys. There is contrast in the collecting systems ureters. There is contrast in the bladder. Bladder wall and lumen are unremarkable. Stomach/Bowel: There is a PEG tube adequately positioned with the balloon in the body of the stomach. Opacified and unopacified small bowel segments are normal caliber and the gastric wall is normal thickness. The normal appendix is well seen. The large intestine wall unremarkable with mild fecal stasis. Vascular/Lymphatic: Minimal scattered aortic calcific plaque with mild calcifications of the iliac arteries. No AAA. There is new demonstration of a nonspecific mildly enlarged gastrohepatic ligament lymph node measuring 1.2 cm short axis on 3:32. There is no other appreciable adenopathy but there have developed multiple peritoneal implants consistent with peritoneal metastatic disease, since 08/26/2022. There are several of these anterior to the liver. Largest of these is 3.2 x 1.8 cm on 3:19. Another is just anterior to the lateral segment of the left lobe of the liver measuring 1.7 x 1.4 cm on 3:34. Smaller peritoneal implants are present anterior to the distal stomach,  with a larger lesion along the lesser curve of the stomach measuring 3.1 x 2.8 cm on 3:37. Additional smaller scattered omental/peritoneal implants are present in the anterior left mid to lower abdomen. Reproductive: Enlarged fibroid uterus again noted with multiple calcified and partially calcified fibroids. 3.3 cm left adnexal cyst is unchanged. No other adnexal abnormality. Pelvic phleboliths. Other: Moderate free ascites is present. Largest collection is left lower abdominopelvic quadrant. The ascites is probably malignant in etiology and measures between about 18 and 26 Hounsfield units. There is a VP shunt in the abdomen with the tip in the proximal left paracolic gutter. This was present previously. There is no free air or free hemorrhage, nor focal inflammatory process. Small umbilical fat hernia. Musculoskeletal: No regional bone metastasis is seen. There is degenerative change of the lumbar spine with advanced facet arthrosis L3-4 and L4-5 with acquired spinal stenosis both levels. IMPRESSION: 1. No evidence of bowel obstruction or inflammatory change. 2. Moderate free ascites, probably malignant in etiology. 3. Multiple new peritoneal implants consistent with peritoneal metastatic disease. 4. New mildly enlarged gastrohepatic ligament lymph node. 5. Enlarged fibroid uterus with stable 3.3 cm left adnexal cyst. 6. Constipation and diverticulosis. 7. Aortic and coronary artery atherosclerosis.  Mild cardiomegaly. 8. Minimal layering pleural effusions with adjacent atelectasis. 9. PEG tube and VP shunt. 10. Advanced facet arthrosis L3-4 and L4-5 with acquired spinal stenosis at both levels. Electronically Signed   By: Almira Bar M.D.   On: 12/02/2022 00:26   CT Angio Chest Pulmonary Embolism (PE) W or WO Contrast  Result Date: 12/01/2022 CLINICAL DATA:  Chest pain. Elevated D-dimer. History of breast cancer. Evaluate for pulmonary embolism. EXAM: CT ANGIOGRAPHY CHEST WITH CONTRAST  TECHNIQUE:  Multidetector CT imaging of the chest was performed using the standard protocol during bolus administration of intravenous contrast. Multiplanar CT image reconstructions and MIPs were obtained to evaluate the vascular anatomy. RADIATION DOSE REDUCTION: This exam was performed according to the departmental dose-optimization program which includes automated exposure control, adjustment of the mA and/or kV according to patient size and/or use of iterative reconstruction technique. CONTRAST:  75mL OMNIPAQUE IOHEXOL 350 MG/ML SOLN COMPARISON:  Chest CT-06/01/2021 PET-CT-06/11/2021 FINDINGS: Vascular Findings: There is adequate opacification of the pulmonary arterial system with the main pulmonary artery measuring 346 Hounsfield units. There are no discrete filling defects within the pulmonary arterial tree to suggest pulmonary embolism. Normal caliber of the main pulmonary artery. Cardiomegaly.  No pericardial effusion. No evidence of thoracic aortic aneurysm or dissection on this nongated examination. Bovine configuration of the aortic arch. The branch vessels of the aortic arch are tortuous though widely patent throughout their imaged courses. Review of the MIP images confirms the above findings. ---------------------------------------------------------------------------------- Nonvascular Findings: Mediastinum/Lymph Nodes: Progressive left axillary, supraclavicular and retro pectoral lymphadenopathy with index left axillary lymph node measuring 2 cm in greatest short axis diameter (image 30, series 4), previously, 1.2 cm when compared to PET-CT performed 06/11/2021. Index left supraclavicular lymph node now measures 1.4 cm and subpectoral lymph node measures 1.3 cm (image 19, series 4). Interval development of borderline enlarged mediastinal and internal mammary lymphadenopathy with index prevascular lymph node measuring 0.6 cm (image 27, series 4) and index right internal mamillary chain lymph node measuring 0.7 cm  (image 38, series 4). No definitive hilar or right axillary lymphadenopathy. Lungs/Pleura: Tracheostomy tube tip terminates superior to the carina. There is a minimal amount of nonocclusive debris within the right mainstem bronchus. The central pulmonary airways appear widely patent. Interval development of trace bilateral pleural effusions. Evaluation of the pulmonary parenchyma is degraded secondary to patient respiratory artifact. Redemonstrated apical predominant centrilobular emphysematous change. Minimal dependent subpleural ground-glass consolidative atelectasis. No associated air bronchograms. No pneumothorax. Upper abdomen: Limited arterial phase evaluation of the upper abdomen demonstrates multiple peritoneal implants and masses with index mass within the right upper abdominal quadrant measuring 3.4 x 2.2 cm (image 71, series 4. This finding is associated with development of small amount of intra-abdominal ascites. Musculoskeletal: No definite acute or aggressive osseous abnormalities. Regional soft tissues appear normal. IMPRESSION: 1. No acute cardiopulmonary disease. Specifically, no evidence of pulmonary embolism. 2. Findings compatible with progression of metastatic disease with worsening left axillary, retropectoral, supraclavicular, mediastinal and internal mammary lymphadenopathy and development of multiple peritoneal implants within the imaged right upper abdominal quadrant with small volume intra-abdominal ascites. 3. Interval development of trace bilateral pleural effusions. 4. Cardiomegaly. Electronically Signed   By: Simonne Come M.D.   On: 12/01/2022 10:37   DG Chest Portable 1 View  Result Date: 11/30/2022 CLINICAL DATA:  Cough.  History of breast cancer. EXAM: PORTABLE CHEST 1 VIEW COMPARISON:  None Available. FINDINGS: Film is rotated to the left. Tracheostomy tube in place. Presumed vertically oriented catheter for VP shunt along the right hemithorax. Normal cardiopericardial silhouette  when adjusting for technique. No pneumothorax or edema. Question left retrocardiac opacity versus related to the rotation. Overlapping cardiac leads. IMPRESSION: Tracheostomy tube.  Presumed VP shunt. Rotated radiograph to the left with some left retrocardiac opacity. Subtle infiltrate or effusion is possible. No pneumothorax or edema. Electronically Signed   By: Karen Kays M.D.   On: 11/30/2022 12:23   DG Abd 2 Views  Result Date: 11/29/2022  CLINICAL DATA:  Abdominal distention. EXAM: ABDOMEN - 2 VIEW COMPARISON:  PET-CT dated 08/26/2022. FINDINGS: The bowel gas pattern is normal. There is no evidence of free air. A gastrostomy tube is noted. A catheter overlies the right chest, right abdomen and left abdomen, consistent with a ventriculoperitoneal catheter. A calcified fibroid is seen in the pelvis. Degenerative changes are seen in the spine. IMPRESSION: Nonobstructive bowel gas pattern. Electronically Signed   By: Romona Curls M.D.   On: 11/29/2022 12:40    Catarina Hartshorn, DO  [facility]  If 7PM-7AM, please contact night-coverage [email or web address] Password [email or web address] 12/02/2022, 3:08 PM   LOS: 1 day

## 2022-12-02 NOTE — Progress Notes (Signed)
Patient's daughter Stacy Moran) is requesting call from Provider on rounds today. Can be reached at [phone].

## 2022-12-02 NOTE — Progress Notes (Signed)
Patient called for RT to suction again. Patient had just been suctioned at 0346 with a small amount present. Upon arrival patient does sound rhonchus and secretions can be heard. Smaller amount collected and this time bright red blood was present. Encouraged patient to continue to cough on her own and educated her on too frequent suctioning.

## 2022-12-02 NOTE — Consult Note (Signed)
Consultation Note Date: 12/02/2022   Patient Name: Stacy Moran  DOB: 03-12-1956  MRN: [id number]  Age / Sex: 67 y.o., female  PCP: Stacy Dade, DO Referring Physician: Catarina Hartshorn, MD  Reason for Consultation: Establishing goals of care  HPI/Patient Profile: 67 y.o. 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 CT scans concerning for progression of cancer.   Clinical Assessment and Goals of Care: Consult received and chart review completed. Discussed with Stacy Moran. I met today with Stacy Moran who is awake and lying in bed. She has ongoing secretions. She appears weak and fatigued. She complains of abdominal pain - RN to provide pain medication. Stacy Moran struggles to communicate. She asks me to pray for her.   I called and discussed with daughter, Stacy Moran. Stacy Moran has had a chance to speak with Stacy Moran and has been updated about her mother's concern for cancer progression. I reviewed with Stacy Moran Stacy Moran health journey and ongoing struggles. Stacy Moran has been advocating for her mother to receive care for many years now. Stacy Moran expresses the ongoing struggle with her mother's secretions and how this has impacted her life and decline. We did review the significant change in circumstances now with progressing cancer. Stacy Moran expresses that she understands the reality of the situation but also remains faithful and trust in God to care for her mother. We discussed that there are difficult discussions and decisions ahead of them. We discussed the difficulty in continuing cancer treatment in her declining  condition and progressing cancer. Stacy Moran expresses understanding. She expresses hope for a different facility that can better meet her mother's needs. Unfortunately our conversation was cut short as Stacy Moran needed to return to work. I told her I would leave [facility] Moran booklet for her at bedside.   Primary Decision Maker Patient and daughter Stacy Moran    SUMMARY OF RECOMMENDATIONS   - Needs ongoing goals of care conversations - Recommend ongoing palliative care inpatient and outpatient  Code Status/Advance Care Planning: Full code - needs further discussion   Symptom Management:  Per attending  Prognosis:  Overall prognosis poor.   Discharge Planning: To Be Determined      Primary Diagnoses: Present on Admission:  Pneumonia  Bilateral vocal cord paralysis  Ruptured cerebral aneurysm (HCC)  Prediabetes  H/O total thyroidectomy  Postoperative hypothyroidism  Essential hypertension  Ineffective airway clearance  Functional urinary incontinence  Lobar pneumonia (HCC)   I have reviewed the medical record, interviewed the patient and family, and examined the patient. The following aspects are pertinent.  Past Medical History:  Diagnosis Date   Acute respiratory failure (HCC)    Hypertension    Prosthetic eye globe    Ruptured aneurysm of artery (HCC)    Seizures (HCC)  Status post insertion of percutaneous endoscopic gastrostomy (PEG) tube (HCC)    Stroke (HCC)    Subdural hematoma (HCC)    Tracheostomy dependent (HCC)    Tracheostomy present (HCC)    Social History   Socioeconomic History   Marital status: Single    Spouse name: Not on file   Number of children: Not on file   Years of education: Not on file   Highest education level: Not on file  Occupational History   Not on file  Tobacco Use   Smoking status: Former   Smokeless tobacco: Never  Vaping Use   Vaping status: Never Used  Substance and Sexual Activity   Alcohol use: Not Currently   Drug use:  Not Currently   Sexual activity: Not Currently  Other Topics Concern   Not on file  Social History Narrative   Not on file   Social Determinants of Health   Financial Resource Strain: Not on file  Food Insecurity: No Food Insecurity (11/30/2022)   Hunger Vital Sign    Worried About Running Out of Food in the Last Year: Never true    Ran Out of Food in the Last Year: Never true  Transportation Needs: No Transportation Needs (11/30/2022)   PRAPARE - Administrator, Civil Service (Medical): No    Lack of Transportation (Non-Medical): No  Physical Activity: Not on file  Stress: Not on file  Social Connections: Not on file   Family History  Problem Relation Age of Onset   Breast cancer Sister    Ovarian cancer Sister        dx 30s,d. 30s   Brain cancer Daughter        dx 32s   Scheduled Meds:  ALPRAZolam  0.5 mg Per J Tube BID   amLODipine  5 mg Per Tube Daily   chlorhexidine  15 mL Mouth Rinse BID   famotidine  20 mg Per Tube Daily   feeding supplement (OSMOLITE 1.2 CAL)  355 mL Per Tube 5 X Daily   fluticasone  1 spray Each Nare BID   free water  150 mL Per Tube Q6H   glycopyrrolate  1 mg Per Tube BID   guaiFENesin  10 mL Per Tube Q8H   heparin  5,000 Units Subcutaneous Q8H   influenza vaccine adjuvanted  0.5 mL Intramuscular Tomorrow-1000   ipratropium-albuterol  3 mL Nebulization Q4H   levETIRAcetam  750 mg Per Tube BID   levothyroxine  100 mcg Per Tube Q0600   melatonin  3 mg Per Tube QHS   polyethylene glycol  17 g Per Tube BID   QUEtiapine  50 mg Per Tube BID   scopolamine  1 patch Transdermal Q72H   senna  1 tablet Per Tube Daily   sodium chloride  3 mL Nebulization QID   Continuous Infusions:  0.9 % NaCl with KCl 20 mEq / L 75 mL/hr at 12/01/22 2014   azithromycin 500 mg (12/01/22 1216)   cefTRIAXone (ROCEPHIN)  IV 1 g (12/01/22 1126)   PRN Meds:.acetaminophen **OR** acetaminophen, bisacodyl, fentaNYL (SUBLIMAZE) injection, hydrALAZINE,  HYDROcodone-acetaminophen, ondansetron **OR** ondansetron (ZOFRAN) IV, mouth rinse No Known Allergies Review of Systems  Unable to perform ROS: Acuity of condition    Physical Exam Vitals and nursing note reviewed.  Constitutional:      Appearance: She is ill-appearing.  Cardiovascular:     Rate and Rhythm: Normal rate.  Pulmonary:     Effort: No tachypnea, accessory muscle usage or  respiratory distress.     Comments: Copious secretions; trach Abdominal:     Palpations: Abdomen is soft.     Tenderness: There is abdominal tenderness.  Neurological:     Mental Status: She is alert.     Comments: Communication is a struggle with trach and severe weakness/fatigue; unable to fully access orientation     Vital Signs: BP (!) 146/99 (BP Location: Left Arm)   Pulse 94   Temp 98.4 F (36.9 C) (Oral)   Resp 17   Ht 5\' 6"  (1.676 m)   Wt 72.5 kg   SpO2 100%   BMI 25.80 kg/m  Pain Scale: 0-10   Pain Score: 0-No pain   SpO2: SpO2: 100 % O2 Device:SpO2: 100 % O2 Flow Rate: .O2 Flow Rate (L/min): 10 L/min  IO: Intake/output summary:  Intake/Output Summary (Last 24 hours) at 12/02/2022 1136 Last data filed at 12/02/2022 0900 Gross per 24 hour  Intake 853.79 ml  Output 650 ml  Net 203.79 ml    LBM: Last BM Date : 12/01/22 Baseline Weight: Weight: 72.5 kg Most recent weight: Weight: 72.5 kg     Palliative Assessment/Data:    Time Total: 60 min  Greater than 50%  of this time was spent counseling and coordinating care related to the above assessment and plan.  Signed by: Yong Channel, NP Palliative Medicine Team Pager # [phone] (M-F 8a-5p) Team Phone # [phone] (Nights/Weekends)

## 2022-12-02 NOTE — Consult Note (Signed)
[facility] Consultation Oncology  Name: Stacy Moran      MRN: [id number]    Location: A314/A314-01  Date: 12/02/2022 Time:4:44 PM   REFERRING PHYSICIAN: Dr. Arbutus Leas  REASON FOR CONSULT: Metastatic breast cancer    HISTORY OF PRESENT ILLNESS: [name] is a 67 year old pleasant female known to me from office visits.  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  She is a resident at [facility].  She was sent for elevated D-dimer.  CT angiogram of the chest on 12/01/2022: No evidence of pulmonary embolism.  Findings compatible with progression of metastatic disease with worsening left axillary, retroperitoneal, supraclavicular, mediastinal and internal mammary lymphadenopathy and development of multiple peritoneal implants within the imaged right upper quadrant of the abdomen.  CT abdomen on 12/01/2022 showed moderate free ascites with multiple new peritoneal implants.  New mildly enlarged gastrohepatic ligament lymph nodes.  PAST MEDICAL HISTORY:   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)    Stroke (HCC)    Subdural hematoma (HCC)    Tracheostomy dependent (HCC)    Tracheostomy present (HCC)     ALLERGIES: No Known Allergies    MEDICATIONS: I have reviewed the patient's current medications.     PAST SURGICAL HISTORY Past Surgical History:  Procedure Laterality Date   BURR HOLE N/A 03/18/2020   Procedure: Ezekiel Ina;  Surgeon: Lisbeth Renshaw, MD;  Location: [facility] OR;  Service: Neurosurgery;  Laterality: N/A;   CRANIOTOMY Left 01/30/2020   Procedure: LEFT FAR LATERAL CRANIOTOMY FOR ANEURYSM CLIPPING;  Surgeon: Lisbeth Renshaw, MD;  Location: [facility] OR;  Service: Neurosurgery;   Laterality: Left;   DIRECT LARYNGOSCOPY N/A 02/29/2020   Procedure: DIRECT LARYNGOSCOPY;  Surgeon: Serena Colonel, MD;  Location: [facility] OR;  Service: ENT;  Laterality: N/A;   DIRECT LARYNGOSCOPY N/A 10/03/2020   Procedure: DIRECT LARYNGOSCOPY w/esophagascopy;  Surgeon: Serena Colonel, MD;  Location: [facility] OR;  Service: ENT;  Laterality: N/A;   INTUBATION-ENDOTRACHEAL WITH TRACHEOSTOMY STANDBY N/A 12/14/2020   Procedure: Alcario Drought WITH TRACHEOSTOMY STANDBY;  Surgeon: Bud Face, MD;  Location: [facility] ORS;  Service: ENT;  Laterality: N/A;   IR ANGIO INTRA EXTRACRAN SEL INTERNAL CAROTID BILAT MOD SED  01/30/2020   IR ANGIO VERTEBRAL SEL VERTEBRAL UNI L MOD SED  01/30/2020   IR CM INJ ANY COLONIC TUBE W/FLUORO  07/03/2020   IR GASTROSTOMY TUBE MOD SED  02/22/2020   IR PATIENT EVAL TECH 0-60 MINS  02/11/2022   IR REPLC GASTRO/COLONIC TUBE PERCUT W/FLUORO  07/03/2020   IR REPLC GASTRO/COLONIC TUBE PERCUT W/FLUORO  09/22/2022   LAPAROSCOPIC REVISION VENTRICULAR-PERITONEAL (V-P) SHUNT N/A 03/18/2020   Procedure: LAPAROSCOPIC REVISION VENTRICULAR-PERITONEAL (V-P) SHUNT;  Surgeon: Fritzi Mandes, MD;  Location: [facility] OR;  Service: General;  Laterality: N/A;   RADIOLOGY WITH ANESTHESIA N/A 01/30/2020   Procedure: IR WITH ANESTHESIA;  Surgeon: Lisbeth Renshaw, MD;  Location[facility] OR;  Service: Radiology;  Laterality: N/A;   THYROIDECTOMY N/A 02/08/2020   Procedure: THYROIDECTOMY;  Surgeon: Serena Colonel, MD;  Location: [facility] OR;  Service: ENT;  Laterality: N/A;   TRACHEOSTOMY TUBE PLACEMENT N/A 02/08/2020   Procedure: TRACHEOSTOMY;  Surgeon: Serena Colonel, MD;  Location: [facility] OR;  Service: ENT;  Laterality: N/A;   TRACHEOSTOMY TUBE PLACEMENT N/A 02/29/2020   Procedure: TRACHEOSTOMY EXCHANGE;  Surgeon: Serena Colonel, MD;  Location: [facility] OR;  Service: ENT;  Laterality: N/A;   VENTRICULOPERITONEAL SHUNT N/A 03/18/2020   Procedure: RIGHT SHUNT INSERTION VENTRICULAR-PERITONEAL/ BURR HOLE Evacuation of Subdural Hematoma;   Surgeon: Lisbeth Renshaw, MD;  Location: [facility] OR;  Service: Neurosurgery;  Laterality: N/A;    FAMILY HISTORY: Family History  Problem Relation Age of Onset   Breast cancer Sister    Ovarian cancer Sister        dx 30s,d. 30s   Brain cancer Daughter        dx 6s    SOCIAL HISTORY:  reports that she has quit smoking. She has never used smokeless tobacco. She reports that she does not currently use alcohol. She reports that she does not currently use drugs.  PERFORMANCE STATUS: The patient's performance status is 3 - Symptomatic, >50% confined to bed  PHYSICAL EXAM: Most Recent Vital Signs: Blood pressure 128/74, pulse 96, temperature 98.7 F (37.1 C), temperature source Oral, resp. rate 19, height 5\' 6"  (1.676 m), weight 159 lb 13.3 oz (72.5 kg), SpO2 96%. BP 128/74 (BP Location: Left Arm)   Pulse 96   Temp 98.7 F (37.1 C) (Oral)   Resp 19   Ht 5\' 6"  (1.676 m)   Wt 159 lb 13.3 oz (72.5 kg)   SpO2 96%   BMI 25.80 kg/m  General appearance: alert and cooperative Extremities:  No edema or cyanosis. Abdomen: Distended and tight.  LABORATORY DATA:  Results for orders placed or performed during the hospital encounter of 11/30/22 (from the past 48 hour(s))  MRSA Next Gen by PCR, Nasal     Status: None   Collection Time: 11/30/22  5:45 PM   Specimen: Nasal Mucosa; Nasal Swab  Result Value Ref Range   MRSA by PCR Next Gen NOT DETECTED NOT DETECTED    Comment: (NOTE) The GeneXpert MRSA Assay (FDA approved for NASAL specimens only), is one component of a comprehensive MRSA colonization surveillance program. It is not intended to diagnose MRSA infection nor to guide or monitor treatment for MRSA infections. Test performance is not FDA approved in patients less than 22 years old. Performed at [facility], [address], [place], [place] [address]   HIV Antibody (routine testing w rflx)     Status: None   Collection Time: 12/01/22  4:46 AM  Result Value Ref Range   HIV  Screen 4th Generation wRfx Non Reactive Non Reactive    Comment: Performed at [facility], [address] [place], [place] [address]  Procalcitonin     Status: None   Collection Time: 12/01/22  4:46 AM  Result Value Ref Range   Procalcitonin <0.10 ng/mL    Comment:        Interpretation: PCT (Procalcitonin) <= 0.5 ng/mL: Systemic infection (sepsis) is not likely. Local bacterial infection is possible. (NOTE)       Sepsis PCT Algorithm           Lower Respiratory Tract                                      Infection PCT Algorithm    ----------------------------     ----------------------------         PCT < 0.25 ng/mL  PCT < 0.10 ng/mL          Strongly encourage             Strongly discourage   discontinuation of antibiotics    initiation of antibiotics    ----------------------------     -----------------------------       PCT 0.25 - 0.50 ng/mL            PCT 0.10 - 0.25 ng/mL               OR       >80% decrease in PCT            Discourage initiation of                                            antibiotics      Encourage discontinuation           of antibiotics    ----------------------------     -----------------------------         PCT >= 0.50 ng/mL              PCT 0.26 - 0.50 ng/mL               AND        <80% decrease in PCT             Encourage initiation of                                             antibiotics       Encourage continuation           of antibiotics    ----------------------------     -----------------------------        PCT >= 0.50 ng/mL                  PCT > 0.50 ng/mL               AND         increase in PCT                  Strongly encourage                                      initiation of antibiotics    Strongly encourage escalation           of antibiotics                                     -----------------------------                                           PCT <= 0.25 ng/mL                                                  OR                                        >  80% decrease in PCT                                      Discontinue / Do not initiate                                             antibiotics  Performed at [facility], [address] [place], [place] [address]   Basic metabolic panel     Status: Abnormal   Collection Time: 12/01/22  4:46 AM  Result Value Ref Range   Sodium 139 135 - 145 mmol/L   Potassium 3.7 3.5 - 5.1 mmol/L   Chloride 99 98 - 111 mmol/L   CO2 30 22 - 32 mmol/L   Glucose, Bld 169 (H) 70 - 99 mg/dL    Comment: Glucose reference range applies only to samples taken after fasting for at least 8 hours.   BUN 15 8 - 23 mg/dL   Creatinine, Ser 5.78 0.44 - 1.00 mg/dL   Calcium 8.7 (L) 8.9 - 10.3 mg/dL   GFR, Estimated >46 >96 mL/min    Comment: (NOTE) Calculated using the CKD-EPI Creatinine Equation (2021)    Anion gap 10 5 - 15    Comment: Performed at [facility], [address] [place], [place] [address]  CBC     Status: Abnormal   Collection Time: 12/01/22  4:46 AM  Result Value Ref Range   WBC 9.5 4.0 - 10.5 K/uL   RBC 4.79 3.87 - 5.11 MIL/uL   Hemoglobin 12.0 12.0 - 15.0 g/dL   HCT 41.3 24.4 - 01.0 %   MCV 80.8 80.0 - 100.0 fL   MCH 25.1 (L) 26.0 - 34.0 pg   MCHC 31.0 30.0 - 36.0 g/dL   RDW 27.2 53.6 - 64.4 %   Platelets 197 150 - 400 K/uL   nRBC 0.0 0.0 - 0.2 %    Comment: Performed at [facility], [address], [place], [place] [address]  Vitamin B12     Status: Abnormal   Collection Time: 12/01/22  9:08 AM  Result Value Ref Range   Vitamin B-12 1,809 (H) 180 - 914 pg/mL    Comment: RESULT CONFIRMED BY MANUAL DILUTION (NOTE) This assay is not validated for testing neonatal or myeloproliferative syndrome specimens for Vitamin B12 levels. Performed at [facility], [address] [place], [place] [address]   T4, free     Status: None   Collection Time: 12/01/22  9:08 AM  Result Value Ref Range   Free T4 0.86 0.61 - 1.12 ng/dL     Comment: (NOTE) Biotin ingestion may interfere with free T4 tests. If the results are inconsistent with the TSH level, previous test results, or the clinical presentation, then consider biotin interference. If needed, order repeat testing after stopping biotin. Performed at [facility], [address] [place], [place] [address]   Troponin I (High Sensitivity)     Status: None   Collection Time: 12/01/22  9:11 AM  Result Value Ref Range   Troponin I (High Sensitivity) 3 <18 ng/L    Comment: (NOTE) Elevated high sensitivity troponin I (hsTnI) values and significant  changes across serial measurements may suggest ACS but many other  chronic and acute conditions are known to elevate hsTnI results.  Refer to the "Links" section for chest pain algorithms and additional  guidance. Performed at [facility], [address], [place], [place] [address]   Folate     Status: None   Collection Time: 12/01/22  9:11 AM  Result Value Ref Range   Folate 10.0 >5.9 ng/mL    Comment: Performed at [facility], [address] [place], [place] [address]  TSH     Status: Abnormal   Collection Time: 12/01/22  9:11 AM  Result Value Ref Range   TSH 8.457 (H) 0.350 - 4.500 uIU/mL    Comment: Performed by a 3rd Generation assay with a functional sensitivity of <=0.01 uIU/mL. Performed at [facility], [address] [place], [place] [address]   Troponin I (High Sensitivity)     Status: None   Collection Time: 12/01/22 11:21 AM  Result Value Ref Range   Troponin I (High Sensitivity) 4 <18 ng/L    Comment: (NOTE) Elevated high sensitivity troponin I (hsTnI) values and significant  changes across serial measurements may suggest ACS but many other  chronic and acute conditions are known to elevate hsTnI results.  Refer to the "Links" section for chest pain algorithms and additional  guidance. Performed at [facility], [address], [place], [place] [address]   CBC     Status: Abnormal    Collection Time: 12/02/22  4:41 AM  Result Value Ref Range   WBC 8.1 4.0 - 10.5 K/uL   RBC 5.12 (H) 3.87 - 5.11 MIL/uL   Hemoglobin 12.7 12.0 - 15.0 g/dL   HCT 29.5 62.1 - 30.8 %   MCV 82.8 80.0 - 100.0 fL   MCH 24.8 (L) 26.0 - 34.0 pg   MCHC 30.0 30.0 - 36.0 g/dL   RDW 65.7 84.6 - 96.2 %   Platelets 183 150 - 400 K/uL    Comment: SPECIMEN CHECKED FOR CLOTS PLATELET COUNT CONFIRMED BY SMEAR    nRBC 0.0 0.0 - 0.2 %    Comment: Performed at [facility], [address] [place], [place] [address]  Basic metabolic panel     Status: Abnormal   Collection Time: 12/02/22  4:41 AM  Result Value Ref Range   Sodium 141 135 - 145 mmol/L   Potassium 4.1 3.5 - 5.1 mmol/L   Chloride 103 98 - 111 mmol/L   CO2 27 22 - 32 mmol/L   Glucose, Bld 111 (H) 70 - 99 mg/dL    Comment: Glucose reference range applies only to samples taken after fasting for at least 8 hours.   BUN 7 (L) 8 - 23 mg/dL   Creatinine, Ser 1.32 0.44 - 1.00 mg/dL   Calcium 8.6 (L) 8.9 - 10.3 mg/dL   GFR, Estimated >44 >01 mL/min    Comment: (NOTE) Calculated using the CKD-EPI Creatinine Equation (2021)    Anion gap 11 5 - 15    Comment: Performed at [facility], [address] [place], [place] [address]  Magnesium     Status: Abnormal   Collection Time: 12/02/22  4:41 AM  Result Value Ref Range   Magnesium 2.5 (H) 1.7 - 2.4 mg/dL    Comment: Performed at [facility], [address] [place], [place] [address]  Blood gas, venous     Status: Abnormal   Collection Time: 12/02/22  4:16 PM  Result Value Ref Range   pH, Ven 7.4 7.25 - 7.43   pCO2, Ven 52 44 - 60 mmHg   pO2, Ven 41 32 - 45  mmHg   Bicarbonate 32.3 (H) 20.0 - 28.0 mmol/L   Acid-Base Excess 6.3 (H) 0.0 - 2.0 mmol/L   O2 Saturation 71.4 %   Patient temperature 37.5    Collection site BLOOD RIGHT WRIST    Drawn by [phone]     Comment: Performed at [facility], [address], [place], [place] [address]      RADIOGRAPHY: CT ABDOMEN PELVIS WO  CONTRAST  Result Date: 12/02/2022 CLINICAL DATA:  Stage III C left breast cancer with evidence of stage IV disease on CTA chest earlier today with upper abdominal peritoneal implants and progressive adenopathy. Bowel obstruction suspected with abdominal pain and distention. EXAM: CT ABDOMEN AND PELVIS WITHOUT CONTRAST TECHNIQUE: Multidetector CT imaging of the abdomen and pelvis was performed following the standard protocol without IV contrast. RADIATION DOSE REDUCTION: This exam was performed according to the departmental dose-optimization program which includes automated exposure control, adjustment of the mA and/or kV according to patient size and/or use of iterative reconstruction technique. COMPARISON:  CTA chest today at 9:33 a.m., PET-CT studies dated 08/26/2022 and 06/03/2022. FINDINGS: Lower chest: Again noted are bilateral symmetric minimal small layering pleural effusions with adjacent atelectasis. Lung bases are clear of infiltrates. Mild cardiomegaly. No pericardial effusion is seen. There is two-vessel trace calcific plaque in the LAD and right coronary arteries. Hepatobiliary: The liver is 18 cm length. No focal abnormality is seen without contrast. Gallbladder and bile ducts are unremarkable. Pancreas: Partially atrophic. Otherwise unremarkable without contrast. Spleen: There is homogeneous noncontrast attenuation. No masses seen without contrast. No splenomegaly. Adrenals/Urinary Tract: Slight chronic nodular thickening left adrenal gland. Unremarkable right adrenal gland and unenhanced bilateral kidneys. There is contrast in the collecting systems ureters. There is contrast in the bladder. Bladder wall and lumen are unremarkable. Stomach/Bowel: There is a PEG tube adequately positioned with the balloon in the body of the stomach. Opacified and unopacified small bowel segments are normal caliber and the gastric wall is normal thickness. The normal appendix is well seen. The large intestine wall  unremarkable with mild fecal stasis. Vascular/Lymphatic: Minimal scattered aortic calcific plaque with mild calcifications of the iliac arteries. No AAA. There is new demonstration of a nonspecific mildly enlarged gastrohepatic ligament lymph node measuring 1.2 cm short axis on 3:32. There is no other appreciable adenopathy but there have developed multiple peritoneal implants consistent with peritoneal metastatic disease, since 08/26/2022. There are several of these anterior to the liver. Largest of these is 3.2 x 1.8 cm on 3:19. Another is just anterior to the lateral segment of the left lobe of the liver measuring 1.7 x 1.4 cm on 3:34. Smaller peritoneal implants are present anterior to the distal stomach, with a larger lesion along the lesser curve of the stomach measuring 3.1 x 2.8 cm on 3:37. Additional smaller scattered omental/peritoneal implants are present in the anterior left mid to lower abdomen. Reproductive: Enlarged fibroid uterus again noted with multiple calcified and partially calcified fibroids. 3.3 cm left adnexal cyst is unchanged. No other adnexal abnormality. Pelvic phleboliths. Other: Moderate free ascites is present. Largest collection is left lower abdominopelvic quadrant. The ascites is probably malignant in etiology and measures between about 18 and 26 Hounsfield units. There is a VP shunt in the abdomen with the tip in the proximal left paracolic gutter. This was present previously. There is no free air or free hemorrhage, nor focal inflammatory process. Small umbilical fat hernia. Musculoskeletal: No regional bone metastasis is seen. There is degenerative change of the lumbar spine  with advanced facet arthrosis L3-4 and L4-5 with acquired spinal stenosis both levels. IMPRESSION: 1. No evidence of bowel obstruction or inflammatory change. 2. Moderate free ascites, probably malignant in etiology. 3. Multiple new peritoneal implants consistent with peritoneal metastatic disease. 4. New  mildly enlarged gastrohepatic ligament lymph node. 5. Enlarged fibroid uterus with stable 3.3 cm left adnexal cyst. 6. Constipation and diverticulosis. 7. Aortic and coronary artery atherosclerosis.  Mild cardiomegaly. 8. Minimal layering pleural effusions with adjacent atelectasis. 9. PEG tube and VP shunt. 10. Advanced facet arthrosis L3-4 and L4-5 with acquired spinal stenosis at both levels. Electronically Signed   By: Almira Bar M.D.   On: 12/02/2022 00:26   CT Angio Chest Pulmonary Embolism (PE) W or WO Contrast  Result Date: 12/01/2022 CLINICAL DATA:  Chest pain. Elevated D-dimer. History of breast cancer. Evaluate for pulmonary embolism. EXAM: CT ANGIOGRAPHY CHEST WITH CONTRAST TECHNIQUE: Multidetector CT imaging of the chest was performed using the standard protocol during bolus administration of intravenous contrast. Multiplanar CT image reconstructions and MIPs were obtained to evaluate the vascular anatomy. RADIATION DOSE REDUCTION: This exam was performed according to the departmental dose-optimization program which includes automated exposure control, adjustment of the mA and/or kV according to patient size and/or use of iterative reconstruction technique. CONTRAST:  75mL OMNIPAQUE IOHEXOL 350 MG/ML SOLN COMPARISON:  Chest CT-06/01/2021 PET-CT-06/11/2021 FINDINGS: Vascular Findings: There is adequate opacification of the pulmonary arterial system with the main pulmonary artery measuring 346 Hounsfield units. There are no discrete filling defects within the pulmonary arterial tree to suggest pulmonary embolism. Normal caliber of the main pulmonary artery. Cardiomegaly.  No pericardial effusion. No evidence of thoracic aortic aneurysm or dissection on this nongated examination. Bovine configuration of the aortic arch. The branch vessels of the aortic arch are tortuous though widely patent throughout their imaged courses. Review of the MIP images confirms the above findings.  ---------------------------------------------------------------------------------- Nonvascular Findings: Mediastinum/Lymph Nodes: Progressive left axillary, supraclavicular and retro pectoral lymphadenopathy with index left axillary lymph node measuring 2 cm in greatest short axis diameter (image 30, series 4), previously, 1.2 cm when compared to PET-CT performed 06/11/2021. Index left supraclavicular lymph node now measures 1.4 cm and subpectoral lymph node measures 1.3 cm (image 19, series 4). Interval development of borderline enlarged mediastinal and internal mammary lymphadenopathy with index prevascular lymph node measuring 0.6 cm (image 27, series 4) and index right internal mamillary chain lymph node measuring 0.7 cm (image 38, series 4). No definitive hilar or right axillary lymphadenopathy. Lungs/Pleura: Tracheostomy tube tip terminates superior to the carina. There is a minimal amount of nonocclusive debris within the right mainstem bronchus. The central pulmonary airways appear widely patent. Interval development of trace bilateral pleural effusions. Evaluation of the pulmonary parenchyma is degraded secondary to patient respiratory artifact. Redemonstrated apical predominant centrilobular emphysematous change. Minimal dependent subpleural ground-glass consolidative atelectasis. No associated air bronchograms. No pneumothorax. Upper abdomen: Limited arterial phase evaluation of the upper abdomen demonstrates multiple peritoneal implants and masses with index mass within the right upper abdominal quadrant measuring 3.4 x 2.2 cm (image 71, series 4. This finding is associated with development of small amount of intra-abdominal ascites. Musculoskeletal: No definite acute or aggressive osseous abnormalities. Regional soft tissues appear normal. IMPRESSION: 1. No acute cardiopulmonary disease. Specifically, no evidence of pulmonary embolism. 2. Findings compatible with progression of metastatic disease with  worsening left axillary, retropectoral, supraclavicular, mediastinal and internal mammary lymphadenopathy and development of multiple peritoneal implants within the imaged right upper abdominal quadrant with small  volume intra-abdominal ascites. 3. Interval development of trace bilateral pleural effusions. 4. Cardiomegaly. Electronically Signed   By: Simonne Come M.D.   On: 12/01/2022 10:37         ASSESSMENT and PLAN:  1.  Metastatic left breast cancer, ER/PR positive, HER2 negative: - She was receiving Faslodex injections with repeat PET scan on 08/26/2022 showing decrease in metabolism indicating response.  However she missed with me a month ago. - CT angiogram on 12/02/2022 showed worsening lymphadenopathy in the left axillary, retropectoral, supraclavicular, mediastinal and intramammary nodes.  Multiple peritoneal implants in the right upper quadrant.  CTAP without contrast showed moderate free ascites with multiple new peritoneal implants.  New mildly enlarged gastrohepatic ligament lymph node. - Unfortunately she has progressive disease on Faslodex. - Due to her overall performance status, I do not believe she is a candidate for active therapy. - I have recommended palliative care with hospice. - Will discuss with Dr. Arbutus Leas and her daughter.  All questions were answered. The patient knows to call the clinic with any problems, questions or concerns. We can certainly see the patient much sooner if necessary.    Doreatha Massed

## 2022-12-03 ENCOUNTER — Inpatient Hospital Stay (HOSPITAL_COMMUNITY): Payer: Medicaid Other

## 2022-12-03 ENCOUNTER — Inpatient Hospital Stay (HOSPITAL_COMMUNITY)
Admit: 2022-12-03 | Discharge: 2022-12-03 | Disposition: A | Discharge status: Home / Self Care | Payer: Medicaid Other | Attending: Internal Medicine

## 2022-12-03 ENCOUNTER — Encounter (HOSPITAL_COMMUNITY): Payer: Self-pay | Admitting: Internal Medicine

## 2022-12-03 DIAGNOSIS — R569 Unspecified convulsions: Secondary | ICD-10-CM

## 2022-12-03 DIAGNOSIS — J181 Lobar pneumonia, unspecified organism: Secondary | ICD-10-CM | POA: Diagnosis not present

## 2022-12-03 DIAGNOSIS — G9341 Metabolic encephalopathy: Secondary | ICD-10-CM | POA: Diagnosis not present

## 2022-12-03 DIAGNOSIS — Z930 Tracheostomy status: Secondary | ICD-10-CM | POA: Diagnosis not present

## 2022-12-03 DIAGNOSIS — J9621 Acute and chronic respiratory failure with hypoxia: Secondary | ICD-10-CM | POA: Diagnosis not present

## 2022-12-03 LAB — BODY FLUID CELL COUNT WITH DIFFERENTIAL
Eos, Fluid: 0 %
Lymphs, Fluid: 22 %
Monocyte-Macrophage-Serous Fluid: 30 % — ABNORMAL LOW (ref 50–90)
Neutrophil Count, Fluid: 48 % — ABNORMAL HIGH (ref 0–25)
Total Nucleated Cell Count, Fluid: 1409 uL — ABNORMAL HIGH (ref 0–1000)

## 2022-12-03 LAB — COMPREHENSIVE METABOLIC PANEL
ALT: 18 U/L (ref 0–44)
AST: 30 U/L (ref 15–41)
Albumin: 2.8 g/dL — ABNORMAL LOW (ref 3.5–5.0)
Alkaline Phosphatase: 63 U/L (ref 38–126)
Anion gap: 11 (ref 5–15)
BUN: 11 mg/dL (ref 8–23)
CO2: 26 mmol/L (ref 22–32)
Calcium: 8.3 mg/dL — ABNORMAL LOW (ref 8.9–10.3)
Chloride: 103 mmol/L (ref 98–111)
Creatinine, Ser: 0.64 mg/dL (ref 0.44–1.00)
GFR, Estimated: >60 mL/min (ref >60)
Glucose, Bld: 77 mg/dL (ref 70–99)
Potassium: 4.4 mmol/L (ref 3.5–5.1)
Sodium: 140 mmol/L (ref 135–145)
Total Bilirubin: 0.4 mg/dL (ref 0.3–1.2)
Total Protein: 6.5 g/dL (ref 6.5–8.1)

## 2022-12-03 LAB — GRAM STAIN

## 2022-12-03 LAB — ALBUMIN, PLEURAL OR PERITONEAL FLUID: Albumin, Fluid: 2.2 g/dL

## 2022-12-03 LAB — CBC
HCT: 41.7 % (ref 36.0–46.0)
Hemoglobin: 12.3 g/dL (ref 12.0–15.0)
MCH: 24.7 pg — ABNORMAL LOW (ref 26.0–34.0)
MCHC: 29.5 g/dL — ABNORMAL LOW (ref 30.0–36.0)
MCV: 83.7 fL (ref 80.0–100.0)
Platelets: 175 10*3/uL (ref 150–400)
RBC: 4.98 MIL/uL (ref 3.87–5.11)
RDW: 14.6 % (ref 11.5–15.5)
WBC: 7.1 10*3/uL (ref 4.0–10.5)
nRBC: 0 % (ref 0.0–0.2)

## 2022-12-03 MED ORDER — OSMOLITE 1.5 CAL PO LIQD
1000.0000 mL | ORAL | Status: DC
  Administered 2022-12-03 – 2022-12-04 (×2): 1000 mL

## 2022-12-03 MED ORDER — OSMOLITE 1.5 CAL PO LIQD
1000.0000 mL | ORAL | Status: DC
  Administered 2022-12-03: 1000 mL

## 2022-12-03 MED ORDER — LIDOCAINE HCL (PF) 2 % IJ SOLN
INTRAMUSCULAR | Status: AC
  Filled 2022-12-03: qty 20

## 2022-12-03 MED ORDER — LIDOCAINE HCL (PF) 2 % IJ SOLN
10.0000 mL | Freq: Once | INTRAMUSCULAR | Status: AC
  Administered 2022-12-03: 10 mL
  Filled 2022-12-03: qty 10

## 2022-12-03 NOTE — Progress Notes (Addendum)
Responded to nursing call:  pt agitated pulling at leads and IV and clothes I came to bedside to evaluate patient  Subjective: Patient is awake and alert.  She follows commands.  She denies cp, sob. She acknowledges she has been pulling off her clothes.  Vitals:   12/03/22 1000 12/03/22 1313 12/03/22 1424 12/03/22 1713  BP:   (!) 142/109   Pulse:   (!) 101   Resp:   20   Temp:   98.6 F (37 C)   TempSrc:   Oral   SpO2: 93% 100% 91% 92%  Weight:      Height:       CV--RRR Lung--scattered rhonchi Abd--soft+BS/NT   Assessment/Plan: Intermittent agitation -when I came to bedside to evaluate patient, she is alert and follows commands and answers simple yes/no questions -She acknowledges she was pulling off her clothes--redirectable when told to stop and follows commands -defer any addition hypnotic meds for now as she is re-directable and not currently posing a danger to herself or staff -leave patient off telemetry -she is already on scheduled xanax and seroquel  EEG results today-suggestive of moderate diffuse encephalopathy; No seizures or definite epileptiform discharges were seen throughout the recording.      Catarina Hartshorn, DO [facility]

## 2022-12-03 NOTE — Procedures (Signed)
Patient Name: Stacy Moran  MRN: [id number]  Epilepsy Attending: Charlsie Quest  Referring Physician/Provider: Catarina Hartshorn, MD  Date: 12/03/2022 Duration: 22.50 mins  Patient history: 67yo F with ams getting eeg to evaluate for seizure  Level of alertness: Awake  AEDs during EEG study: Xanax  Technical aspects: This EEG study was done with scalp electrodes positioned according to the 10-20 International system of electrode placement. Electrical activity was reviewed with band pass filter of 1-70Hz , sensitivity of 7 uV/mm, display speed of 74mm/sec with a 60Hz  notched filter applied as appropriate. EEG data were recorded continuously and digitally stored.  Video monitoring was available and reviewed as appropriate.  Description: No posterior dominant rhythm was seen. EEG showed continuous generalized 3 to 6 Hz theta-delta slowing. Periodic discharges with triphasic morphology were noted at 0.5-1 hz intermittently. Hyperventilation and photic stimulation were not performed.      ABNORMALITY - Continuous slow, generalized - Periodic discharges with triphasic morphology, generalized   IMPRESSION: This study is suggestive of moderate diffuse encephalopathy, nonspecific etiology.  Periodic discharges with triphasic morphology can be on the ictal-interictal continuum but given the frequency and the morphology, these are more likely secondary to toxic-metabolic causes.  No seizures or definite epileptiform discharges were seen throughout the recording.   Stacy Moran

## 2022-12-03 NOTE — Progress Notes (Signed)
EEG complete - results pending 

## 2022-12-03 NOTE — Progress Notes (Signed)
   12/03/22 1025  Spiritual Encounters  Type of Visit Initial  Care provided to: Patient  Referral source Nurse (RN/NT/LPN)  Reason for visit  (Spiritual Consult)  OnCall Visit No   Chaplain responded to a spiritual consult request. I met with the patient, Taffy, who was alert and welcoming to share her space with me. She shared that she has family and they bring her much joy Her daughter visits with her often. Launi is sad about her situation. We took time to prayer for strength and clarity for her in the journey of her life.   Valerie Roys [facility]  [phone]

## 2022-12-03 NOTE — Progress Notes (Signed)
Patient suctioned x 2 , copious amount of clear sputum from oral cavity and tracheostomy . Patient TF osmolite 1.5 cal rated changed to 65ml/hour, Thus far she is tolerating well. HOB at 35 degrees. Call bell and yaunker for oral suction within patient reach.

## 2022-12-03 NOTE — Progress Notes (Signed)
Nutrition Follow-up  DOCUMENTATION CODES:   Non-severe (moderate) malnutrition in context of acute illness/injury  INTERVENTION:   Continue Osmolite 1.5 at 20 ml/h, recommend increase by 10 ml every 4 hours to goal rate of 55 ml/h as tolerated pending decisions regarding goals of care. Provides 1980 kcal, 83 gm protein, 1008 ml free water daily. To meet hydration needs, recommend free water flushes 200 ml 5 times per day.   If bolus feedings desired, recommend: Osmolite 1.5 275 ml 5 times per day (1375 ml total per day) Free water flushes 200 ml 5 times per day Provides 2060 kcal, 86 gm protein, 2050 ml free water total daily.  NUTRITION DIAGNOSIS:   Moderate Malnutrition related to acute illness as evidenced by mild muscle depletion, mild fat depletion.  Ongoing  GOAL:   Patient will meet greater than or equal to 90% of their needs  Progressing  MONITOR:   TF tolerance, Skin, Labs  REASON FOR ASSESSMENT:   Consult Enteral/tube feeding initiation and management  ASSESSMENT:   67 yo female admitted with generalized weakness, dysuria. PMH includes HTN, breast cancer, SDH, CVA, ruptured aneurysm, seizure disorder, trach dependent, vocal cord paralysis, G-tube for nutrition and medications, PEG recently replaced.  CT abd/pelvis 9/4 showed new peritoneal implants c/w metastatic breast cancer and new gastrohepatic ligament lymph node. Oncology recommending palliative care with hospice. Inpatient palliative care team is following. S/P paracentesis today, 2.4 L fluid removed.  Labs and medications reviewed.  TF was resumed today with Osmolite 1.5 at 20 ml/h. During RD visit with patient, she reported no abdominal pain. Also receiving free water flushes 150 ml every 6 hours.   Diet Order:   Diet Order             Diet NPO time specified  Diet effective now                   EDUCATION NEEDS:   No education needs have been identified at this time  Skin:  Skin  Assessment: Reviewed RN Assessment  Last BM:  9/5 type 6  Height:   Ht Readings from Last 1 Encounters:  11/30/22 5\' 6"  (1.676 m)    Weight:   Wt Readings from Last 1 Encounters:  11/30/22 72.5 kg    Ideal Body Weight:  59.1 kg  BMI:  Body mass index is 25.8 kg/m.  Estimated Nutritional Needs:   Kcal:  1800-2000  Protein:  80-100 gm  Fluid:  >/= 1.8 L   Gabriel Rainwater RD, LDN, CNSC Please refer to Amion for contact information.

## 2022-12-03 NOTE — Progress Notes (Addendum)
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  At baseline she is reportedly ambulatory.  She had been experiencing generalized weakness for the past several days and there was a concern that she had a UTI.  There was no fever or chills abdominal pain or chest pain or shortness of breath.    She wears a depends at baseline.  Her G-tube was recently replaced.  All nutrition and medications are given through G-tube.  She was worked up in the ED and discharged back to [facility].  She returned to ED 11/30/2022 with fever of 101.2, persistent weakness shortness of breath emesis and nausea.  They reported that patient has had increasing tracheal secretions, wheezing and coughing.  She has not been able to ambulate which is not her baseline.  Her workup in the ED was suspicious for a pneumonia and she was started on ceftriaxone and azithromycin and admitted for further evaluation and treatment.   Assessment/Plan: Acute respiratory on chronic failure with hypoxia  -due to aspiration pneumonitis -chronically on 4L trach collar blow by -now up to 10L>>8L -9/4 CTA chest--neg for PE;  progression of metastatic disease with worsening left axillary, retropectoral, supraclavicular, mediastinal and internal mammary lymphadenopathy -check VBG 7.4/52/41/32   Generalized weakness -Multifactorial including pneumonia, UTI, metastatic cancer,  deconditioning -Continue antibiotics -PT evaluation when stable -UA>50 WBC -B12--1809 -Folic acid--10.0 -TSH--8.457, Free T4 0.86   Lobar pneumonia/Aspiration pneumonitis -Personally reviewed chest x-ray--left retrocardiac opacity -Continue ceftriaxone azithromycin -MRSA screen negative -PCT 0.11 -9/6 restart enteral feeding at lower rate   Peritoneal carcinomatosis -12/01/22 CT AP--moderate ascites; no bowel obstruction; new peritoneal implants consistent with peritoneal metastatic disease; new gastrohepatic ligament lymph node -12/03/22--paracentesis 2.4L removed -SAAG <1.1   UTI--Proteus -Continue ceftriaxone -UA>50 WBC   Acute metabolic encephalopathy -multifactorial including infection, resp failure -more awake on 9/7 -unable to perform MRI brain due to aneurysm clips   -check ammonia--26 -B12--1809 -folate 10.0 -TSH 8.457, Free T4--0.80   Chest pain -personally reviewed EKG--sinus, nonspecific TWI -cycle troponin 3>>4 -CTA chest--neg PE   Left breast cancer-stage IIIb -Patient follows up with Dr. Ellin Saba - Continue Faslodex monthly  -discussed with Dr. Ernestine Conrad a candidate  for active therapy>> recommends palliative care/hospice   Dysphagia -pt uses PEG for all nutrition and medication -restart enteral feeding at lower rate -Free water flushes   Seizure disorder -resume keppra   Hypothyroidism -resume home levothyroxine    Essential Hypertension  -resume BP medications when reconciled    Mood Disorder -resume home medications   Hypermetabolism in the left central uterus:  -she has seen Dr. Despina Hidden -unable to perform EMBx due to fibroids -did not feel further eval with surgical intervention is warranted give she is not bleeding and is on aromatase inhibitor   Moderate malnutrition -restart supplements when able to tolerate           Family Communication:   daughter updated 9/5   Consultants:  med/onc, palliative   Code Status:  FULL  DVT Prophylaxis:  [place] Heparin      Procedures: As Listed in Progress Note Above   Antibiotics: Ceftriaxone 9/3>> Azithro 9/3>>           Subjective: Pt is more alert and following one step commands.  Denies f/c, cp, sob, abd pain.  Objective: Vitals:   12/03/22 0950 12/03/22 1000 12/03/22 1313 12/03/22 1424  BP: (!) 142/88   (!) 142/109  Pulse: (!) 104   (!) 101  Resp: 18   20  Temp:    98.6 F (37 C)  TempSrc:    Oral  SpO2: 94% 93% 100% 91%  Weight:      Height:        Intake/Output Summary (Last 24 hours) at 12/03/2022 1610 Last data filed at 12/03/2022 1424 Gross per 24 hour  Intake 0 ml  Output --  Net 0 ml   Weight change:  Exam:  General:  Pt is alert, follows commands appropriately, not in acute distress HEENT: No icterus, No thrush, No neck mass, [place]/AT Cardiovascular: RRR, S1/S2, no rubs, no gallops Respiratory: bilateral scattered rales.  No wheeze Abdomen: Soft/+BS, non tender, non distended, no guarding Extremities: No edema, No lymphangitis, No petechiae, No rashes, no synovitis   Data Reviewed: I have personally reviewed following labs and imaging studies Basic Metabolic Panel: Recent Labs  Lab 11/29/22 1050 11/30/22 1000 12/01/22 0446 12/02/22 0441 12/03/22 0441  NA 136 137 139 141 140  K 3.9 3.6 3.7 4.1 4.4  CL 99 97* 99 103 103  CO2 30 31 30 27 26   GLUCOSE 212* 206* 169* 111* 77  BUN 18 18 15  7* 11  CREATININE 0.77 0.71 0.63 0.55 0.64  CALCIUM 8.7* 8.9 8.7* 8.6* 8.3*  MG  --   --   --  2.5*  --    Liver Function Tests: Recent Labs  Lab 11/30/22 1000 12/03/22 0441  AST 34 30  ALT 16 18  ALKPHOS 69 63  BILITOT 0.6 0.4  PROT 7.5 6.5  ALBUMIN 3.5 2.8*   No results for input(s): "LIPASE", "AMYLASE" in the last 168 hours. Recent Labs  Lab 12/02/22 1616  AMMONIA 26   Coagulation Profile: No results for input(s): "INR", "PROTIME" in the last 168 hours. CBC: Recent Labs  Lab 11/29/22 1050 11/30/22 1000  12/01/22 0446 12/02/22 0441 12/03/22 0441  WBC 6.7 7.8 9.5 8.1 7.1  NEUTROABS 5.0 6.2  --   --   --   HGB 12.6 13.0 12.0 12.7 12.3  HCT 40.6 41.8 38.7 42.4 41.7  MCV 81.4 81.0 80.8 82.8 83.7  PLT 229 225 197 183 175   Cardiac Enzymes: No results for input(s): "CKTOTAL", "CKMB", "CKMBINDEX", "TROPONINI" in the last 168 hours. BNP: Invalid input(s): "POCBNP" CBG: No results for input(s): "GLUCAP" in the last 168 hours. HbA1C: No results for input(s): "HGBA1C" in the last 72 hours. Urine analysis:    Component Value Date/Time   COLORURINE AMBER (A) 11/30/2022 1030   APPEARANCEUR CLOUDY (A) 11/30/2022 1030   LABSPEC 1.024 11/30/2022 1030   PHURINE 7.0 11/30/2022 1030   GLUCOSEU NEGATIVE 11/30/2022 1030   HGBUR SMALL (A) 11/30/2022 1030   BILIRUBINUR NEGATIVE 11/30/2022 1030   KETONESUR NEGATIVE 11/30/2022 1030   PROTEINUR 100 (A) 11/30/2022 1030   NITRITE NEGATIVE 11/30/2022 1030   LEUKOCYTESUR LARGE (A) 11/30/2022 1030   Sepsis Labs: @LABRCNTIP (procalcitonin:4,lacticidven:4) ) Recent Results (from the past 240 hour(s))  Urine Culture     Status: None   Collection Time: 11/29/22  9:57 AM   Specimen: Urine, Catheterized  Result Value Ref Range Status   Specimen Description   Final    URINE, CATHETERIZED Performed at [facility], [address], [place], [place] [address]    Special Requests   Final    NONE Performed at [facility], [address] [place], [place] [address]    Culture   Final    NO GROWTH Performed at [facility], [address], [place], [place] [address]    Report Status 11/30/2022 FINAL  Final  Culture, blood (routine x 2)     Status: None (Preliminary result)   Collection Time: 11/30/22 10:00 AM   Specimen: BLOOD  Result Value Ref Range Status   Specimen Description BLOOD RIGHT ASSIST CONTROL  Final   Special Requests   Final    BOTTLES DRAWN AEROBIC AND ANAEROBIC Blood Culture results may not be optimal due to an excessive volume of  blood received in culture bottles   Culture   Final    NO GROWTH 3 DAYS Performed at [facility], [address] [place], [place] [address]    Report Status PENDING  Incomplete  SARS Coronavirus 2 by RT PCR (hospital order, performed in [facility] hospital lab) *cepheid single result test* Anterior Nasal Swab     Status: None   Collection Time: 11/30/22 10:10 AM   Specimen: Anterior Nasal Swab  Result Value Ref Range Status   SARS Coronavirus 2 by RT PCR NEGATIVE NEGATIVE Final    Comment: (NOTE) SARS-CoV-2 target nucleic acids are NOT DETECTED.  The SARS-CoV-2 RNA is generally detectable in upper and lower respiratory specimens during the acute phase of infection. The lowest concentration of SARS-CoV-2 viral copies this assay can detect is 250 copies / mL. A negative result does not preclude SARS-CoV-2 infection and should not be used as the sole basis for treatment or other patient management decisions.  A negative result may occur with improper specimen collection / handling, submission of specimen other than nasopharyngeal swab, presence of viral mutation(s) within the areas targeted by this assay, and inadequate number of viral copies (<250 copies / mL). A negative result must be combined with clinical observations, patient history, and epidemiological information.  Fact Sheet for Patients:   RoadLapTop.co.za  Fact Sheet for Healthcare Providers: http://kim-miller.com/  This test is not yet approved or  cleared by the [place] FDA and has been authorized for detection and/or diagnosis of SARS-CoV-2 by FDA under an Emergency Use Authorization (EUA).  This EUA will remain in effect (meaning this test can be used) for the duration of the COVID-19 declaration under Section 564(b)(1) of the Act, 21 U.S.C. section 360bbb-3(b)(1), unless the authorization is terminated or revoked sooner.  Performed at [facility], [address] [place], [place] [address]   Urine Culture     Status: Abnormal   Collection Time: 11/30/22 10:30 AM   Specimen: Urine, Random  Result Value Ref Range Status   Specimen Description   Final    URINE, RANDOM Performed at [facility], [address] [place], [place] [address]    Special Requests   Final    NONE Reflexed from [id number] Performed at [facility], [address], [place], [place] [address]    Culture (A)  Final    >=100,000 COLONIES/mL PROTEUS MIRABILIS WITHIN MIXED ORGANISMS Performed at [facility], [address], [place], [place] [address]    Report Status 12/02/2022 FINAL  Final  Organism ID, Bacteria PROTEUS MIRABILIS (A)  Final      Susceptibility   Proteus mirabilis - MIC*    AMPICILLIN <=2 SENSITIVE Sensitive     CEFAZOLIN <=4 SENSITIVE Sensitive     CEFEPIME <=0.12 SENSITIVE Sensitive     CEFTRIAXONE <=0.25 SENSITIVE Sensitive     CIPROFLOXACIN <=0.25 SENSITIVE Sensitive     GENTAMICIN <=1 SENSITIVE Sensitive     IMIPENEM 2 SENSITIVE Sensitive     NITROFURANTOIN 128 RESISTANT Resistant     TRIMETH/SULFA <=20 SENSITIVE Sensitive     AMPICILLIN/SULBACTAM <=2 SENSITIVE Sensitive     PIP/TAZO <=4 SENSITIVE Sensitive     * >=100,000 COLONIES/mL PROTEUS MIRABILIS  Culture, blood (routine x 2)     Status: None (Preliminary result)   Collection Time: 11/30/22 10:57 AM   Specimen: BLOOD  Result Value Ref Range Status   Specimen Description BLOOD BLOOD LEFT HAND  Final   Special Requests   Final    BOTTLES DRAWN AEROBIC AND ANAEROBIC Blood Culture adequate volume   Culture   Final    NO GROWTH 3 DAYS Performed at [facility], [address], [place], [place] [address]    Report Status PENDING  Incomplete  MRSA Next Gen by PCR, Nasal     Status: None   Collection Time: 11/30/22  5:45 PM   Specimen: Nasal Mucosa; Nasal Swab  Result Value Ref Range Status   MRSA by PCR Next Gen NOT DETECTED NOT DETECTED Final    Comment: (NOTE) The GeneXpert MRSA  Assay (FDA approved for NASAL specimens only), is one component of a comprehensive MRSA colonization surveillance program. It is not intended to diagnose MRSA infection nor to guide or monitor treatment for MRSA infections. Test performance is not FDA approved in patients less than 70 years old. Performed at [facility], [address] [place], [place] [address]   Gram stain     Status: None   Collection Time: 12/03/22  9:43 AM   Specimen: Peritoneal Washings  Result Value Ref Range Status   Specimen Description PERITONEAL  Final   Special Requests NONE  Final   Gram Stain   Final    CYTOSPIN SMEAR WBC PRESENT,BOTH PMN AND MONONUCLEAR NO ORGANISMS SEEN Performed at [facility], [address] [place], [place] [address]    Report Status 12/03/2022 FINAL  Final     Scheduled Meds:  ALPRAZolam  0.5 mg Per J Tube BID   chlorhexidine  15 mL Mouth Rinse BID   famotidine  20 mg Per Tube Daily   fluticasone  1 spray Each Nare BID   free water  150 mL Per Tube Q6H   glycopyrrolate  1 mg Per Tube BID   guaiFENesin  10 mL Per Tube Q8H   heparin  5,000 Units Subcutaneous Q8H   influenza vaccine adjuvanted  0.5 mL Intramuscular Tomorrow-1000   ipratropium-albuterol  3 mL Nebulization Q4H   levETIRAcetam  750 mg Per Tube BID   levothyroxine  100 mcg Per Tube Q0600   melatonin  3 mg Per Tube QHS   polyethylene glycol  17 g Per Tube BID   QUEtiapine  50 mg Per Tube BID   scopolamine  1 patch Transdermal Q72H   senna  1 tablet Per Tube Daily   sodium chloride  3 mL Nebulization BID   Continuous Infusions:  0.9 % NaCl with KCl 20 mEq / L 75 mL/hr at 12/02/22 2147   azithromycin 500 mg (12/03/22 1411)  cefTRIAXone (ROCEPHIN)  IV 1 g (12/03/22 1411)   feeding supplement (OSMOLITE 1.5 CAL)      Procedures/Studies: US Paracentesis  Result Date: 12/03/2022 INDICATION: Malignant ascites EXAM: ULTRASOUND GUIDED  PARACENTESIS MEDICATIONS: None. COMPLICATIONS: None immediate. PROCEDURE:  Informed written consent was obtained from the patient after a discussion of the risks, benefits and alternatives to treatment. A timeout was performed prior to the initiation of the procedure. Initial ultrasound scanning demonstrates a small to moderate amount of ascites within the right lower abdominal quadrant. The right lower abdomen was prepped and draped in the usual sterile fashion. 1% lidocaine was used for local anesthesia. Following this, a 19 gauge, 7-cm, Yueh catheter was introduced. An ultrasound image was saved for documentation purposes. The paracentesis was performed. The catheter was removed and a dressing was applied. The patient tolerated the procedure well without immediate post procedural complication. FINDINGS: A total of approximately 2.4 L of straw-colored fluid was removed. Samples were sent to the laboratory as requested by the clinical team. IMPRESSION: Successful ultrasound-guided paracentesis yielding 2.4 liters of peritoneal fluid. Electronically Signed   By: Acquanetta Belling M.D.   On: 12/03/2022 11:02   CT ABDOMEN PELVIS WO CONTRAST  Result Date: 12/02/2022 CLINICAL DATA:  Stage III C left breast cancer with evidence of stage IV disease on CTA chest earlier today with upper abdominal peritoneal implants and progressive adenopathy. Bowel obstruction suspected with abdominal pain and distention. EXAM: CT ABDOMEN AND PELVIS WITHOUT CONTRAST TECHNIQUE: Multidetector CT imaging of the abdomen and pelvis was performed following the standard protocol without IV contrast. RADIATION DOSE REDUCTION: This exam was performed according to the departmental dose-optimization program which includes automated exposure control, adjustment of the mA and/or kV according to patient size and/or use of iterative reconstruction technique. COMPARISON:  CTA chest today at 9:33 a.m., PET-CT studies dated 08/26/2022 and 06/03/2022. FINDINGS: Lower chest: Again noted are bilateral symmetric minimal small layering  pleural effusions with adjacent atelectasis. Lung bases are clear of infiltrates. Mild cardiomegaly. No pericardial effusion is seen. There is two-vessel trace calcific plaque in the LAD and right coronary arteries. Hepatobiliary: The liver is 18 cm length. No focal abnormality is seen without contrast. Gallbladder and bile ducts are unremarkable. Pancreas: Partially atrophic. Otherwise unremarkable without contrast. Spleen: There is homogeneous noncontrast attenuation. No masses seen without contrast. No splenomegaly. Adrenals/Urinary Tract: Slight chronic nodular thickening left adrenal gland. Unremarkable right adrenal gland and unenhanced bilateral kidneys. There is contrast in the collecting systems ureters. There is contrast in the bladder. Bladder wall and lumen are unremarkable. Stomach/Bowel: There is a PEG tube adequately positioned with the balloon in the body of the stomach. Opacified and unopacified small bowel segments are normal caliber and the gastric wall is normal thickness. The normal appendix is well seen. The large intestine wall unremarkable with mild fecal stasis. Vascular/Lymphatic: Minimal scattered aortic calcific plaque with mild calcifications of the iliac arteries. No AAA. There is new demonstration of a nonspecific mildly enlarged gastrohepatic ligament lymph node measuring 1.2 cm short axis on 3:32. There is no other appreciable adenopathy but there have developed multiple peritoneal implants consistent with peritoneal metastatic disease, since 08/26/2022. There are several of these anterior to the liver. Largest of these is 3.2 x 1.8 cm on 3:19. Another is just anterior to the lateral segment of the left lobe of the liver measuring 1.7 x 1.4 cm on 3:34. Smaller peritoneal implants are present anterior to the distal stomach, with a larger lesion along the lesser  curve of the stomach measuring 3.1 x 2.8 cm on 3:37. Additional smaller scattered omental/peritoneal implants are present in  the anterior left mid to lower abdomen. Reproductive: Enlarged fibroid uterus again noted with multiple calcified and partially calcified fibroids. 3.3 cm left adnexal cyst is unchanged. No other adnexal abnormality. Pelvic phleboliths. Other: Moderate free ascites is present. Largest collection is left lower abdominopelvic quadrant. The ascites is probably malignant in etiology and measures between about 18 and 26 Hounsfield units. There is a VP shunt in the abdomen with the tip in the proximal left paracolic gutter. This was present previously. There is no free air or free hemorrhage, nor focal inflammatory process. Small umbilical fat hernia. Musculoskeletal: No regional bone metastasis is seen. There is degenerative change of the lumbar spine with advanced facet arthrosis L3-4 and L4-5 with acquired spinal stenosis both levels. IMPRESSION: 1. No evidence of bowel obstruction or inflammatory change. 2. Moderate free ascites, probably malignant in etiology. 3. Multiple new peritoneal implants consistent with peritoneal metastatic disease. 4. New mildly enlarged gastrohepatic ligament lymph node. 5. Enlarged fibroid uterus with stable 3.3 cm left adnexal cyst. 6. Constipation and diverticulosis. 7. Aortic and coronary artery atherosclerosis.  Mild cardiomegaly. 8. Minimal layering pleural effusions with adjacent atelectasis. 9. PEG tube and VP shunt. 10. Advanced facet arthrosis L3-4 and L4-5 with acquired spinal stenosis at both levels. Electronically Signed   By: Almira Bar M.D.   On: 12/02/2022 00:26   CT Angio Chest Pulmonary Embolism (PE) W or WO Contrast  Result Date: 12/01/2022 CLINICAL DATA:  Chest pain. Elevated D-dimer. History of breast cancer. Evaluate for pulmonary embolism. EXAM: CT ANGIOGRAPHY CHEST WITH CONTRAST TECHNIQUE: Multidetector CT imaging of the chest was performed using the standard protocol during bolus administration of intravenous contrast. Multiplanar CT image reconstructions  and MIPs were obtained to evaluate the vascular anatomy. RADIATION DOSE REDUCTION: This exam was performed according to the departmental dose-optimization program which includes automated exposure control, adjustment of the mA and/or kV according to patient size and/or use of iterative reconstruction technique. CONTRAST:  75mL OMNIPAQUE IOHEXOL 350 MG/ML SOLN COMPARISON:  Chest CT-06/01/2021 PET-CT-06/11/2021 FINDINGS: Vascular Findings: There is adequate opacification of the pulmonary arterial system with the main pulmonary artery measuring 346 Hounsfield units. There are no discrete filling defects within the pulmonary arterial tree to suggest pulmonary embolism. Normal caliber of the main pulmonary artery. Cardiomegaly.  No pericardial effusion. No evidence of thoracic aortic aneurysm or dissection on this nongated examination. Bovine configuration of the aortic arch. The branch vessels of the aortic arch are tortuous though widely patent throughout their imaged courses. Review of the MIP images confirms the above findings. ---------------------------------------------------------------------------------- Nonvascular Findings: Mediastinum/Lymph Nodes: Progressive left axillary, supraclavicular and retro pectoral lymphadenopathy with index left axillary lymph node measuring 2 cm in greatest short axis diameter (image 30, series 4), previously, 1.2 cm when compared to PET-CT performed 06/11/2021. Index left supraclavicular lymph node now measures 1.4 cm and subpectoral lymph node measures 1.3 cm (image 19, series 4). Interval development of borderline enlarged mediastinal and internal mammary lymphadenopathy with index prevascular lymph node measuring 0.6 cm (image 27, series 4) and index right internal mamillary chain lymph node measuring 0.7 cm (image 38, series 4). No definitive hilar or right axillary lymphadenopathy. Lungs/Pleura: Tracheostomy tube tip terminates superior to the carina. There is a minimal  amount of nonocclusive debris within the right mainstem bronchus. The central pulmonary airways appear widely patent. Interval development of trace bilateral pleural effusions. Evaluation of  the pulmonary parenchyma is degraded secondary to patient respiratory artifact. Redemonstrated apical predominant centrilobular emphysematous change. Minimal dependent subpleural ground-glass consolidative atelectasis. No associated air bronchograms. No pneumothorax. Upper abdomen: Limited arterial phase evaluation of the upper abdomen demonstrates multiple peritoneal implants and masses with index mass within the right upper abdominal quadrant measuring 3.4 x 2.2 cm (image 71, series 4. This finding is associated with development of small amount of intra-abdominal ascites. Musculoskeletal: No definite acute or aggressive osseous abnormalities. Regional soft tissues appear normal. IMPRESSION: 1. No acute cardiopulmonary disease. Specifically, no evidence of pulmonary embolism. 2. Findings compatible with progression of metastatic disease with worsening left axillary, retropectoral, supraclavicular, mediastinal and internal mammary lymphadenopathy and development of multiple peritoneal implants within the imaged right upper abdominal quadrant with small volume intra-abdominal ascites. 3. Interval development of trace bilateral pleural effusions. 4. Cardiomegaly. Electronically Signed   By: Simonne Come M.D.   On: 12/01/2022 10:37   DG Chest Portable 1 View  Result Date: 11/30/2022 CLINICAL DATA:  Cough.  History of breast cancer. EXAM: PORTABLE CHEST 1 VIEW COMPARISON:  None Available. FINDINGS: Film is rotated to the left. Tracheostomy tube in place. Presumed vertically oriented catheter for VP shunt along the right hemithorax. Normal cardiopericardial silhouette when adjusting for technique. No pneumothorax or edema. Question left retrocardiac opacity versus related to the rotation. Overlapping cardiac leads. IMPRESSION:  Tracheostomy tube.  Presumed VP shunt. Rotated radiograph to the left with some left retrocardiac opacity. Subtle infiltrate or effusion is possible. No pneumothorax or edema. Electronically Signed   By: Karen Kays M.D.   On: 11/30/2022 12:23   DG Abd 2 Views  Result Date: 11/29/2022 CLINICAL DATA:  Abdominal distention. EXAM: ABDOMEN - 2 VIEW COMPARISON:  PET-CT dated 08/26/2022. FINDINGS: The bowel gas pattern is normal. There is no evidence of free air. A gastrostomy tube is noted. A catheter overlies the right chest, right abdomen and left abdomen, consistent with a ventriculoperitoneal catheter. A calcified fibroid is seen in the pelvis. Degenerative changes are seen in the spine. IMPRESSION: Nonobstructive bowel gas pattern. Electronically Signed   By: Romona Curls M.D.   On: 11/29/2022 12:40    Catarina Hartshorn, DO  [facility]  If 7PM-7AM, please contact night-coverage [email or web address] Password [email or web address] 12/03/2022, 4:10 PM   LOS: 2 days

## 2022-12-03 NOTE — Progress Notes (Signed)
Patient tolerated left sided paracentesis procedure well today and 2.4 Liters of serosanguinous ascites removed with labs collected and sent for processing. Patient transported via hospital bed back to inpatient bed assignment at this time with no acute distress noted.

## 2022-12-04 DIAGNOSIS — G9341 Metabolic encephalopathy: Secondary | ICD-10-CM | POA: Diagnosis not present

## 2022-12-04 DIAGNOSIS — Z7189 Other specified counseling: Secondary | ICD-10-CM

## 2022-12-04 DIAGNOSIS — J9621 Acute and chronic respiratory failure with hypoxia: Secondary | ICD-10-CM | POA: Diagnosis not present

## 2022-12-04 DIAGNOSIS — Z930 Tracheostomy status: Secondary | ICD-10-CM | POA: Diagnosis not present

## 2022-12-04 LAB — CBC
HCT: 36.9 % (ref 36.0–46.0)
Hemoglobin: 11.2 g/dL — ABNORMAL LOW (ref 12.0–15.0)
MCH: 24.7 pg — ABNORMAL LOW (ref 26.0–34.0)
MCHC: 30.4 g/dL (ref 30.0–36.0)
MCV: 81.5 fL (ref 80.0–100.0)
Platelets: 240 10*3/uL (ref 150–400)
RBC: 4.53 MIL/uL (ref 3.87–5.11)
RDW: 14.6 % (ref 11.5–15.5)
WBC: 6.1 10*3/uL (ref 4.0–10.5)
nRBC: 0 % (ref 0.0–0.2)

## 2022-12-04 LAB — PHOSPHORUS: Phosphorus: 2.6 mg/dL (ref 2.5–4.6)

## 2022-12-04 LAB — BASIC METABOLIC PANEL
Anion gap: 8 (ref 5–15)
BUN: 10 mg/dL (ref 8–23)
CO2: 27 mmol/L (ref 22–32)
Calcium: 7.8 mg/dL — ABNORMAL LOW (ref 8.9–10.3)
Chloride: 102 mmol/L (ref 98–111)
Creatinine, Ser: 0.61 mg/dL (ref 0.44–1.00)
GFR, Estimated: >60 mL/min (ref >60)
Glucose, Bld: 169 mg/dL — ABNORMAL HIGH (ref 70–99)
Potassium: 4.3 mmol/L (ref 3.5–5.1)
Sodium: 137 mmol/L (ref 135–145)

## 2022-12-04 LAB — MAGNESIUM: Magnesium: 2.4 mg/dL (ref 1.7–2.4)

## 2022-12-04 MED ORDER — HYDROCODONE-ACETAMINOPHEN 5-325 MG PO TABS
1.0000 | ORAL_TABLET | Freq: Four times a day (QID) | ORAL | Status: DC | PRN
  Administered 2022-12-05 (×2): 1
  Filled 2022-12-04 (×2): qty 1

## 2022-12-04 MED ORDER — ACETAMINOPHEN 650 MG RE SUPP: 650.0000 mg | Freq: Four times a day (QID) | RECTAL | Status: DC | PRN

## 2022-12-04 MED ORDER — ACETAMINOPHEN 325 MG PO TABS
650.0000 mg | ORAL_TABLET | Freq: Four times a day (QID) | ORAL | Status: DC | PRN
  Administered 2022-12-04 – 2022-12-06 (×2): 650 mg
  Filled 2022-12-04: qty 2

## 2022-12-04 NOTE — Progress Notes (Addendum)
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  At baseline she is reportedly ambulatory.  She had been experiencing generalized weakness for the past several days and there was a concern that she had a UTI.  There was no fever or chills abdominal pain or chest pain or shortness of breath.    She wears a depends at baseline.  Her G-tube was recently replaced.  All nutrition and medications are given through G-tube.  She was worked up in the ED and discharged back to [facility].  She returned to ED 11/30/2022 with fever of 101.2, persistent weakness shortness of breath emesis and nausea.  They reported that patient has had increasing tracheal secretions, wheezing and coughing.  She has not been able to ambulate which is not her baseline.  Her workup in the ED was suspicious for a pneumonia and she was started on ceftriaxone and azithromycin and admitted for further evaluation and treatment.   Assessment/Plan:  Acute respiratory on chronic failure with hypoxia  -due to aspiration pneumonitis -chronically on 4L trach collar blow by -now up to 10L>>8L -9/4 CTA chest--neg for PE;  progression of metastatic disease with worsening left axillary, retropectoral, supraclavicular, mediastinal and internal mammary lymphadenopathy -check VBG 7.4/52/41/32 -12/04/2022--overall respiratory status stable/improving; she continues to require frequent suctioning  secondary to frothy thin secretions; oxygen saturations 96-98% on the patient is calm   Generalized weakness -Multifactorial including pneumonia, UTI, metastatic cancer, deconditioning -Continue antibiotics -PT evaluation when stable -UA>50 WBC -B12--1809 -Folic acid--10.0 -TSH--8.457, Free T4 0.86   Lobar pneumonia/Aspiration pneumonitis -Personally reviewed chest x-ray--left retrocardiac opacity -Continue ceftriaxone azithromycin -MRSA screen negative -PCT 0.11 -9/6 restart enteral feeding at lower rate -12/04/2022 enteral feedings increased to 40 cc/h with free water flushes   Peritoneal carcinomatosis -12/01/22 CT AP--moderate ascites; no bowel obstruction; new peritoneal implants consistent with peritoneal metastatic disease; new gastrohepatic ligament lymph node -12/03/22--paracentesis 2.4L removed -SAAG <1.1   UTI--Proteus -Continue ceftriaxone -UA>50 WBC   Acute metabolic encephalopathy -multifactorial including infection, resp failure -more awake on 9/7, but continues to have intermittent episodes of agitation -unable to perform MRI brain due to aneurysm clips   -check ammonia--26 -B12--1809 -folate 10.0 -TSH 8.457, Free T4--0.80   Chest pain -personally reviewed EKG--sinus, nonspecific TWI -cycle troponin 3>>4 -CTA chest--neg PE   Left breast cancer-stage IIIb -Patient follows up with Dr. Ellin Saba - previously Faslodex monthly  -discussed with Dr. Ernestine Conrad a candidate  for active therapy>> recommends palliative care/hospice   Dysphagia -pt uses PEG for all nutrition and medication -restart enteral feeding at lower rate -Free water flushes   Seizure disorder -resume keppra   Hypothyroidism -resume home levothyroxine    Essential Hypertension  -BP stable off meds   Mood Disorder -resume home medications   Hypermetabolism in the left central uterus:  -she has seen Dr. Despina Hidden -unable to perform EMBx due to fibroids -did not feel further eval  with surgical intervention is warranted give she is not bleeding and is on aromatase inhibitor  Moderate malnutrition -restarted enteral feeding  Goals of Care -multiple discussions with daughter -discussed again at bedside on 12/04/22--updated pt and daughter on all labs and radiographic studies showing progression of disease -discussed overall poor prognosis in the setting of patient's co-morbidities and poor functional baseline and progression of disease -pt and daughter to discuss d/c to residential hospice vs return to SNF with palliative  -agrees with DNR           Family Communication:   daughter updated 9/6   Consultants:  med/onc, palliative   Code Status:  FULL    DVT Prophylaxis:  [place] Heparin      Procedures: As Listed in Progress Note Above   Antibiotics: Ceftriaxone 9/3>> Azithro 9/3>>     Total time spent 50 minutes.  Greater than 50% spent face to face counseling and coordinating care.   Subjective: Patient is awake and alert.  Denies cp, sob, abd pain, headache.  Objective: Vitals:   12/04/22 0506 12/04/22 0715 12/04/22 0721 12/04/22 0954  BP: 122/76   139/86  Pulse: 97   (!) 110  Resp: 20   (!) 27  Temp: 98 F (36.7 C)   99.2 F (37.3 C)  TempSrc:    Axillary  SpO2: 95% 100% 100% 91%  Weight:      Height:        Intake/Output Summary (Last 24 hours) at 12/04/2022 1029 Last data filed at 12/04/2022 5732 Gross per 24 hour  Intake 2841.05 ml  Output 1350 ml  Net 1491.05 ml   Weight change:  Exam:  General:  Pt is alert, follows commands appropriately, not in acute distress HEENT: No icterus, No thrush, No neck mass, /AT Cardiovascular: RRR, S1/S2, no rubs, no gallops Respiratory: bibasilar crackles.  No wheeze Abdomen: Soft/+BS, non tender, non distended, no guarding Extremities: No edema, No lymphangitis, No petechiae, No rashes, no synovitis   Data Reviewed: I have personally reviewed following labs and imaging studies Basic  Metabolic Panel: Recent Labs  Lab 11/30/22 1000 12/01/22 0446 12/02/22 0441 12/03/22 0441 12/04/22 0429  NA 137 139 141 140 137  K 3.6 3.7 4.1 4.4 4.3  CL 97* 99 103 103 102  CO2 31 30 27 26 27   GLUCOSE 206* 169* 111* 77 169*  BUN 18 15 7* 11 10  CREATININE 0.71 0.63 0.55 0.64 0.61  CALCIUM 8.9 8.7* 8.6* 8.3* 7.8*  MG  --   --  2.5*  --  2.4  PHOS  --   --   --   --  2.6   Liver Function Tests: Recent Labs  Lab 11/30/22 1000 12/03/22 0441  AST 34 30  ALT 16 18  ALKPHOS 69 63  BILITOT 0.6 0.4  PROT 7.5 6.5  ALBUMIN 3.5 2.8*   No results for input(s): "LIPASE", "AMYLASE" in the last 168 hours. Recent Labs  Lab 12/02/22 1616  AMMONIA 26   Coagulation Profile: No results for input(s): "INR", "PROTIME" in the last 168 hours. CBC: Recent Labs  Lab 11/29/22 1050 11/30/22 1000 12/01/22 0446 12/02/22 0441 12/03/22 0441 12/04/22 0429  WBC 6.7 7.8 9.5 8.1 7.1 6.1  NEUTROABS 5.0 6.2  --   --   --   --   HGB 12.6 13.0 12.0 12.7 12.3 11.2*  HCT 40.6 41.8 38.7 42.4 41.7 36.9  MCV 81.4 81.0 80.8 82.8 83.7 81.5  PLT 229 225 197 183 175 240   Cardiac Enzymes: No results for input(s): "CKTOTAL", "CKMB", "CKMBINDEX", "TROPONINI" in the last  168 hours. BNP: Invalid input(s): "POCBNP" CBG: No results for input(s): "GLUCAP" in the last 168 hours. HbA1C: No results for input(s): "HGBA1C" in the last 72 hours. Urine analysis:    Component Value Date/Time   COLORURINE AMBER (A) 11/30/2022 1030   APPEARANCEUR CLOUDY (A) 11/30/2022 1030   LABSPEC 1.024 11/30/2022 1030   PHURINE 7.0 11/30/2022 1030   GLUCOSEU NEGATIVE 11/30/2022 1030   HGBUR SMALL (A) 11/30/2022 1030   BILIRUBINUR NEGATIVE 11/30/2022 1030   KETONESUR NEGATIVE 11/30/2022 1030   PROTEINUR 100 (A) 11/30/2022 1030   NITRITE NEGATIVE 11/30/2022 1030   LEUKOCYTESUR LARGE (A) 11/30/2022 1030   Sepsis Labs: @LABRCNTIP (procalcitonin:4,lacticidven:4) ) Recent Results (from the past 240 hour(s))  Urine  Culture     Status: None   Collection Time: 11/29/22  9:57 AM   Specimen: Urine, Catheterized  Result Value Ref Range Status   Specimen Description   Final    URINE, CATHETERIZED Performed at [facility], [address], [place], [place] [address]    Special Requests   Final    NONE Performed at [facility], [address] [place], [place] [address]    Culture   Final    NO GROWTH Performed at [facility], [address], [place], [place] [address]    Report Status 11/30/2022 FINAL  Final  Culture, blood (routine x 2)     Status: None (Preliminary result)   Collection Time: 11/30/22 10:00 AM   Specimen: BLOOD  Result Value Ref Range Status   Specimen Description BLOOD RIGHT ASSIST CONTROL  Final   Special Requests   Final    BOTTLES DRAWN AEROBIC AND ANAEROBIC Blood Culture results may not be optimal due to an excessive volume of blood received in culture bottles   Culture   Final    NO GROWTH 4 DAYS Performed at [facility], [address] [place], [place] [address]    Report Status PENDING  Incomplete  SARS Coronavirus 2 by RT PCR (hospital order, performed in [facility] hospital lab) *cepheid single result test* Anterior Nasal Swab     Status: None   Collection Time: 11/30/22 10:10 AM   Specimen: Anterior Nasal Swab  Result Value Ref Range Status   SARS Coronavirus 2 by RT PCR NEGATIVE NEGATIVE Final    Comment: (NOTE) SARS-CoV-2 target nucleic acids are NOT DETECTED.  The SARS-CoV-2 RNA is generally detectable in upper and lower respiratory specimens during the acute phase of infection. The lowest concentration of SARS-CoV-2 viral copies this assay can detect is 250 copies / mL. A negative result does not preclude SARS-CoV-2 infection and should not be used as the sole basis for treatment or other patient management decisions.  A negative result may occur with improper specimen collection / handling, submission of specimen other than nasopharyngeal swab,  presence of viral mutation(s) within the areas targeted by this assay, and inadequate number of viral copies (<250 copies / mL). A negative result must be combined with clinical observations, patient history, and epidemiological information.  Fact Sheet for Patients:   RoadLapTop.co.za  Fact Sheet for Healthcare Providers: http://kim-miller.com/  This test is not yet approved or  cleared by the [place] FDA and has been authorized for detection and/or diagnosis of SARS-CoV-2 by FDA under an Emergency Use Authorization (EUA).  This EUA will remain in effect (meaning this test can be used) for the duration of the COVID-19 declaration under Section 564(b)(1) of the Act, 21 U.S.C. section 360bbb-3(b)(1), unless the authorization  is terminated or revoked sooner.  Performed at [facility], [address], [place], [place] [address]   Urine Culture     Status: Abnormal   Collection Time: 11/30/22 10:30 AM   Specimen: Urine, Random  Result Value Ref Range Status   Specimen Description   Final    URINE, RANDOM Performed at [facility], [address], [place], [place] [address]    Special Requests   Final    NONE Reflexed from [id number] Performed at [facility], [address], [place], [place] [address]    Culture (A)  Final    >=100,000 COLONIES/mL PROTEUS MIRABILIS WITHIN MIXED ORGANISMS Performed at [facility], [address], [place], [place] [address]    Report Status 12/02/2022 FINAL  Final   Organism ID, Bacteria PROTEUS MIRABILIS (A)  Final      Susceptibility   Proteus mirabilis - MIC*    AMPICILLIN <=2 SENSITIVE Sensitive     CEFAZOLIN <=4 SENSITIVE Sensitive     CEFEPIME <=0.12 SENSITIVE Sensitive     CEFTRIAXONE <=0.25 SENSITIVE Sensitive     CIPROFLOXACIN <=0.25 SENSITIVE Sensitive     GENTAMICIN <=1 SENSITIVE Sensitive     IMIPENEM 2 SENSITIVE Sensitive     NITROFURANTOIN 128 RESISTANT Resistant      TRIMETH/SULFA <=20 SENSITIVE Sensitive     AMPICILLIN/SULBACTAM <=2 SENSITIVE Sensitive     PIP/TAZO <=4 SENSITIVE Sensitive     * >=100,000 COLONIES/mL PROTEUS MIRABILIS  Culture, blood (routine x 2)     Status: None (Preliminary result)   Collection Time: 11/30/22 10:57 AM   Specimen: BLOOD  Result Value Ref Range Status   Specimen Description BLOOD BLOOD LEFT HAND  Final   Special Requests   Final    BOTTLES DRAWN AEROBIC AND ANAEROBIC Blood Culture adequate volume   Culture   Final    NO GROWTH 4 DAYS Performed at [facility], [address], [place], [place] [address]    Report Status PENDING  Incomplete  MRSA Next Gen by PCR, Nasal     Status: None   Collection Time: 11/30/22  5:45 PM   Specimen: Nasal Mucosa; Nasal Swab  Result Value Ref Range Status   MRSA by PCR Next Gen NOT DETECTED NOT DETECTED Final    Comment: (NOTE) The GeneXpert MRSA Assay (FDA approved for NASAL specimens only), is one component of a comprehensive MRSA colonization surveillance program. It is not intended to diagnose MRSA infection nor to guide or monitor treatment for MRSA infections. Test performance is not FDA approved in patients less than 39 years old. Performed at [facility], [address] [place], [place] [address]   Gram stain     Status: None   Collection Time: 12/03/22  9:43 AM   Specimen: Peritoneal Washings  Result Value Ref Range Status   Specimen Description PERITONEAL  Final   Special Requests NONE  Final   Gram Stain   Final    CYTOSPIN SMEAR WBC PRESENT,BOTH PMN AND MONONUCLEAR NO ORGANISMS SEEN Performed at [facility], [address] [place], [place] [address]    Report Status 12/03/2022 FINAL  Final  Culture, body fluid w Gram Stain-bottle     Status: None (Preliminary result)   Collection Time: 12/03/22  9:43 AM   Specimen: Peritoneal Washings  Result Value Ref Range Status   Specimen Description PERITONEAL  Final   Special Requests 10CC  Final   Culture   Final     NO GROWTH <  24 HOURS Performed at [facility], [address] [place], [place] [address]    Report Status PENDING  Incomplete     Scheduled Meds:  ALPRAZolam  0.5 mg Per J Tube BID   chlorhexidine  15 mL Mouth Rinse BID   famotidine  20 mg Per Tube Daily   fluticasone  1 spray Each Nare BID   free water  150 mL Per Tube Q6H   glycopyrrolate  1 mg Per Tube BID   guaiFENesin  10 mL Per Tube Q8H   heparin  5,000 Units Subcutaneous Q8H   influenza vaccine adjuvanted  0.5 mL Intramuscular Tomorrow-1000   ipratropium-albuterol  3 mL Nebulization Q4H   levETIRAcetam  750 mg Per Tube BID   levothyroxine  100 mcg Per Tube Q0600   melatonin  3 mg Per Tube QHS   polyethylene glycol  17 g Per Tube BID   QUEtiapine  50 mg Per Tube BID   scopolamine  1 patch Transdermal Q72H   senna  1 tablet Per Tube Daily   sodium chloride  3 mL Nebulization BID   Continuous Infusions:  0.9 % NaCl with KCl 20 mEq / L 75 mL/hr at 12/04/22 0309   azithromycin 500 mg (12-12-2022 1411)   cefTRIAXone (ROCEPHIN)  IV 1 g (2022/12/12 1411)   feeding supplement (OSMOLITE 1.5 CAL) 1,000 mL (2022-12-12 1628)    Procedures/Studies: EEG adult  Result Date: Dec 12, 2022 Charlsie Quest, MD     2022-12-12  4:59 PM Patient Name: Stacy Moran MRN: [id number] Epilepsy Attending: Charlsie Quest Referring Physician/Provider: Catarina Hartshorn, MD Date: 2022-12-12 Duration: 22.50 mins Patient history: 67yo F with ams getting eeg to evaluate for seizure Level of alertness: Awake AEDs during EEG study: Xanax Technical aspects: This EEG study was done with scalp electrodes positioned according to the 10-20 International system of electrode placement. Electrical activity was reviewed with band pass filter of 1-70Hz , sensitivity of 7 uV/mm, display speed of 41mm/sec with a 60Hz  notched filter applied as appropriate. EEG data were recorded continuously and digitally stored.  Video monitoring was available and reviewed as appropriate. Description:  No posterior dominant rhythm was seen. EEG showed continuous generalized 3 to 6 Hz theta-delta slowing. Periodic discharges with triphasic morphology were noted at 0.5-1 hz intermittently. Hyperventilation and photic stimulation were not performed.    ABNORMALITY - Continuous slow, generalized - Periodic discharges with triphasic morphology, generalized  IMPRESSION: This study is suggestive of moderate diffuse encephalopathy, nonspecific etiology.  Periodic discharges with triphasic morphology can be on the ictal-interictal continuum but given the frequency and the morphology, these are more likely secondary to toxic-metabolic causes.  No seizures or definite epileptiform discharges were seen throughout the recording.  Priyanka Annabelle Harman    US Paracentesis  Result Date: December 12, 2022 INDICATION: Malignant ascites EXAM: ULTRASOUND GUIDED  PARACENTESIS MEDICATIONS: None. COMPLICATIONS: None immediate. PROCEDURE: Informed written consent was obtained from the patient after a discussion of the risks, benefits and alternatives to treatment. A timeout was performed prior to the initiation of the procedure. Initial ultrasound scanning demonstrates a small to moderate amount of ascites within the right lower abdominal quadrant. The right lower abdomen was prepped and draped in the usual sterile fashion. 1% lidocaine was used for local anesthesia. Following this, a 19 gauge, 7-cm, Yueh catheter was introduced. An ultrasound image was saved for documentation purposes. The paracentesis was performed. The catheter was removed and a dressing was applied. The patient tolerated the procedure well without immediate post procedural  complication. FINDINGS: A total of approximately 2.4 L of straw-colored fluid was removed. Samples were sent to the laboratory as requested by the clinical team. IMPRESSION: Successful ultrasound-guided paracentesis yielding 2.4 liters of peritoneal fluid. Electronically Signed   By: Acquanetta Belling M.D.   On:  12/03/2022 11:02   CT ABDOMEN PELVIS WO CONTRAST  Result Date: 12/02/2022 CLINICAL DATA:  Stage III C left breast cancer with evidence of stage IV disease on CTA chest earlier today with upper abdominal peritoneal implants and progressive adenopathy. Bowel obstruction suspected with abdominal pain and distention. EXAM: CT ABDOMEN AND PELVIS WITHOUT CONTRAST TECHNIQUE: Multidetector CT imaging of the abdomen and pelvis was performed following the standard protocol without IV contrast. RADIATION DOSE REDUCTION: This exam was performed according to the departmental dose-optimization program which includes automated exposure control, adjustment of the mA and/or kV according to patient size and/or use of iterative reconstruction technique. COMPARISON:  CTA chest today at 9:33 a.m., PET-CT studies dated 08/26/2022 and 06/03/2022. FINDINGS: Lower chest: Again noted are bilateral symmetric minimal small layering pleural effusions with adjacent atelectasis. Lung bases are clear of infiltrates. Mild cardiomegaly. No pericardial effusion is seen. There is two-vessel trace calcific plaque in the LAD and right coronary arteries. Hepatobiliary: The liver is 18 cm length. No focal abnormality is seen without contrast. Gallbladder and bile ducts are unremarkable. Pancreas: Partially atrophic. Otherwise unremarkable without contrast. Spleen: There is homogeneous noncontrast attenuation. No masses seen without contrast. No splenomegaly. Adrenals/Urinary Tract: Slight chronic nodular thickening left adrenal gland. Unremarkable right adrenal gland and unenhanced bilateral kidneys. There is contrast in the collecting systems ureters. There is contrast in the bladder. Bladder wall and lumen are unremarkable. Stomach/Bowel: There is a PEG tube adequately positioned with the balloon in the body of the stomach. Opacified and unopacified small bowel segments are normal caliber and the gastric wall is normal thickness. The normal appendix  is well seen. The large intestine wall unremarkable with mild fecal stasis. Vascular/Lymphatic: Minimal scattered aortic calcific plaque with mild calcifications of the iliac arteries. No AAA. There is new demonstration of a nonspecific mildly enlarged gastrohepatic ligament lymph node measuring 1.2 cm short axis on 3:32. There is no other appreciable adenopathy but there have developed multiple peritoneal implants consistent with peritoneal metastatic disease, since 08/26/2022. There are several of these anterior to the liver. Largest of these is 3.2 x 1.8 cm on 3:19. Another is just anterior to the lateral segment of the left lobe of the liver measuring 1.7 x 1.4 cm on 3:34. Smaller peritoneal implants are present anterior to the distal stomach, with a larger lesion along the lesser curve of the stomach measuring 3.1 x 2.8 cm on 3:37. Additional smaller scattered omental/peritoneal implants are present in the anterior left mid to lower abdomen. Reproductive: Enlarged fibroid uterus again noted with multiple calcified and partially calcified fibroids. 3.3 cm left adnexal cyst is unchanged. No other adnexal abnormality. Pelvic phleboliths. Other: Moderate free ascites is present. Largest collection is left lower abdominopelvic quadrant. The ascites is probably malignant in etiology and measures between about 18 and 26 Hounsfield units. There is a VP shunt in the abdomen with the tip in the proximal left paracolic gutter. This was present previously. There is no free air or free hemorrhage, nor focal inflammatory process. Small umbilical fat hernia. Musculoskeletal: No regional bone metastasis is seen. There is degenerative change of the lumbar spine with advanced facet arthrosis L3-4 and L4-5 with acquired spinal stenosis both levels. IMPRESSION: 1. No  evidence of bowel obstruction or inflammatory change. 2. Moderate free ascites, probably malignant in etiology. 3. Multiple new peritoneal implants consistent with  peritoneal metastatic disease. 4. New mildly enlarged gastrohepatic ligament lymph node. 5. Enlarged fibroid uterus with stable 3.3 cm left adnexal cyst. 6. Constipation and diverticulosis. 7. Aortic and coronary artery atherosclerosis.  Mild cardiomegaly. 8. Minimal layering pleural effusions with adjacent atelectasis. 9. PEG tube and VP shunt. 10. Advanced facet arthrosis L3-4 and L4-5 with acquired spinal stenosis at both levels. Electronically Signed   By: Almira Bar M.D.   On: 12/02/2022 00:26   CT Angio Chest Pulmonary Embolism (PE) W or WO Contrast  Result Date: 12/01/2022 CLINICAL DATA:  Chest pain. Elevated D-dimer. History of breast cancer. Evaluate for pulmonary embolism. EXAM: CT ANGIOGRAPHY CHEST WITH CONTRAST TECHNIQUE: Multidetector CT imaging of the chest was performed using the standard protocol during bolus administration of intravenous contrast. Multiplanar CT image reconstructions and MIPs were obtained to evaluate the vascular anatomy. RADIATION DOSE REDUCTION: This exam was performed according to the departmental dose-optimization program which includes automated exposure control, adjustment of the mA and/or kV according to patient size and/or use of iterative reconstruction technique. CONTRAST:  75mL OMNIPAQUE IOHEXOL 350 MG/ML SOLN COMPARISON:  Chest CT-06/01/2021 PET-CT-06/11/2021 FINDINGS: Vascular Findings: There is adequate opacification of the pulmonary arterial system with the main pulmonary artery measuring 346 Hounsfield units. There are no discrete filling defects within the pulmonary arterial tree to suggest pulmonary embolism. Normal caliber of the main pulmonary artery. Cardiomegaly.  No pericardial effusion. No evidence of thoracic aortic aneurysm or dissection on this nongated examination. Bovine configuration of the aortic arch. The branch vessels of the aortic arch are tortuous though widely patent throughout their imaged courses. Review of the MIP images confirms the  above findings. ---------------------------------------------------------------------------------- Nonvascular Findings: Mediastinum/Lymph Nodes: Progressive left axillary, supraclavicular and retro pectoral lymphadenopathy with index left axillary lymph node measuring 2 cm in greatest short axis diameter (image 30, series 4), previously, 1.2 cm when compared to PET-CT performed 06/11/2021. Index left supraclavicular lymph node now measures 1.4 cm and subpectoral lymph node measures 1.3 cm (image 19, series 4). Interval development of borderline enlarged mediastinal and internal mammary lymphadenopathy with index prevascular lymph node measuring 0.6 cm (image 27, series 4) and index right internal mamillary chain lymph node measuring 0.7 cm (image 38, series 4). No definitive hilar or right axillary lymphadenopathy. Lungs/Pleura: Tracheostomy tube tip terminates superior to the carina. There is a minimal amount of nonocclusive debris within the right mainstem bronchus. The central pulmonary airways appear widely patent. Interval development of trace bilateral pleural effusions. Evaluation of the pulmonary parenchyma is degraded secondary to patient respiratory artifact. Redemonstrated apical predominant centrilobular emphysematous change. Minimal dependent subpleural ground-glass consolidative atelectasis. No associated air bronchograms. No pneumothorax. Upper abdomen: Limited arterial phase evaluation of the upper abdomen demonstrates multiple peritoneal implants and masses with index mass within the right upper abdominal quadrant measuring 3.4 x 2.2 cm (image 71, series 4. This finding is associated with development of small amount of intra-abdominal ascites. Musculoskeletal: No definite acute or aggressive osseous abnormalities. Regional soft tissues appear normal. IMPRESSION: 1. No acute cardiopulmonary disease. Specifically, no evidence of pulmonary embolism. 2. Findings compatible with progression of  metastatic disease with worsening left axillary, retropectoral, supraclavicular, mediastinal and internal mammary lymphadenopathy and development of multiple peritoneal implants within the imaged right upper abdominal quadrant with small volume intra-abdominal ascites. 3. Interval development of trace bilateral pleural effusions. 4. Cardiomegaly. Electronically Signed  By: Simonne Come M.D.   On: 12/01/2022 10:37   DG Chest Portable 1 View  Result Date: 11/30/2022 CLINICAL DATA:  Cough.  History of breast cancer. EXAM: PORTABLE CHEST 1 VIEW COMPARISON:  None Available. FINDINGS: Film is rotated to the left. Tracheostomy tube in place. Presumed vertically oriented catheter for VP shunt along the right hemithorax. Normal cardiopericardial silhouette when adjusting for technique. No pneumothorax or edema. Question left retrocardiac opacity versus related to the rotation. Overlapping cardiac leads. IMPRESSION: Tracheostomy tube.  Presumed VP shunt. Rotated radiograph to the left with some left retrocardiac opacity. Subtle infiltrate or effusion is possible. No pneumothorax or edema. Electronically Signed   By: Karen Kays M.D.   On: 11/30/2022 12:23   DG Abd 2 Views  Result Date: 11/29/2022 CLINICAL DATA:  Abdominal distention. EXAM: ABDOMEN - 2 VIEW COMPARISON:  PET-CT dated 08/26/2022. FINDINGS: The bowel gas pattern is normal. There is no evidence of free air. A gastrostomy tube is noted. A catheter overlies the right chest, right abdomen and left abdomen, consistent with a ventriculoperitoneal catheter. A calcified fibroid is seen in the pelvis. Degenerative changes are seen in the spine. IMPRESSION: Nonobstructive bowel gas pattern. Electronically Signed   By: Romona Curls M.D.   On: 11/29/2022 12:40    Catarina Hartshorn, DO  [facility]  If 7PM-7AM, please contact night-coverage [email or web address] Password [email or web address] 12/04/2022, 10:29 AM   LOS: 3 days

## 2022-12-04 NOTE — Progress Notes (Signed)
   12/04/22 1419  Vitals  Temp 98.6 F (37 C)  Temp Source Oral  BP 124/88  MAP (mmHg) 100  BP Location Left Arm  BP Method Automatic  Patient Position (if appropriate) Lying  Pulse Rate (!) 101  Pulse Rate Source Monitor  Resp 19  MEWS COLOR  MEWS Score Color Green  Oxygen Therapy  SpO2 99 %  O2 Device Tracheostomy Collar  MEWS Score  MEWS Temp 0  MEWS Systolic 0  MEWS Pulse 1  MEWS RR 0  MEWS LOC 0  MEWS Score 1

## 2022-12-04 NOTE — Progress Notes (Signed)
Per MD on call mesasge,  reduce feeding volume back to 4ml/hr.

## 2022-12-04 NOTE — TOC Progression Note (Addendum)
Transition of Care [facility]) - Progression Note    Patient Details  Name: Stacy Moran MRN: [id number] Date of Birth: 1955-07-10  [facility] CM/SW Contact  Leitha Bleak, RN Phone Number: 12/04/2022, 1:47 PM  Clinical Narrative:   TOC consulted to refer to [facility] for hospice. MD had Goals of care discussion with daughter.  She would like them to assess Monday. Referral Sent. Hold bed at [facility] at this time, no other offers. TOC following.  Expected Discharge Plan: Long Term Nursing Home Barriers to Discharge: Continued Medical Work up  Expected Discharge Plan and Services In-house Referral: Clinical Social Work Discharge Planning Services: CM Consult Post Acute Care Choice: Nursing Home Living arrangements for the past 2 months: Skilled Nursing Facility                     Social Determinants of Health (SDOH) Interventions SDOH Screenings   Food Insecurity: No Food Insecurity (11/30/2022)  Housing: Medium Risk (11/30/2022)  Transportation Needs: No Transportation Needs (11/30/2022)  Utilities: Not At Risk (11/30/2022)  Depression (PHQ2-9): Low Risk  (08/08/2020)  Recent Concern: Depression (PHQ2-9) - Medium Risk (06/05/2020)  Tobacco Use: Medium Risk (12/03/2022)    Readmission Risk Interventions    12/31/2020    3:40 PM  Readmission Risk Prevention Plan  Transportation Screening Complete  Medication Review (RN Care Manager) Complete  PCP or Specialist appointment within 3-5 days of discharge Complete  HRI or Home Care Consult Complete  SW Recovery Care/Counseling Consult Complete  Palliative Care Screening Not Applicable  Skilled Nursing Facility Complete

## 2022-12-04 NOTE — Progress Notes (Signed)
Patient pointed at her stomach and nodded her head when I asked if her stomach was hurting. MD on call made aware. Feeding volume was changed at approximately 1915 today. Awaiting new orders.

## 2022-12-04 NOTE — Progress Notes (Signed)
   12/04/22 0954  Assess: MEWS Score  Temp 99.2 F (37.3 C)  BP 139/86  MAP (mmHg) 102  Pulse Rate (!) 110  Resp (!) 27  SpO2 91 %  O2 Device Tracheostomy Collar  O2 Flow Rate (L/min) 5 L/min  Assess: MEWS Score  MEWS Temp 0  MEWS Systolic 0  MEWS Pulse 1  MEWS RR 2  MEWS LOC 0  MEWS Score 3  MEWS Score Color Yellow  Assess: if the MEWS score is Yellow or Red  Were vital signs accurate and taken at a resting state? Yes  Does the patient meet 2 or more of the SIRS criteria? Yes  Does the patient have a confirmed or suspected source of infection? Yes  MEWS guidelines implemented  Yes, yellow  Treat  MEWS Interventions Considered administering scheduled or prn medications/treatments as ordered  Take Vital Signs  Increase Vital Sign Frequency  Yellow: Q2hr x1, continue Q4hrs until patient remains green for 12hrs  Escalate  MEWS: Escalate Yellow: Discuss with charge nurse and consider notifying provider and/or RRT  Notify: Charge Nurse/RN  Name of Charge Nurse/RN Notified Brandie RN  Provider Notification  Provider Name/Title MD Tat  Date Provider Notified 12/04/22  Time Provider Notified 1000  Method of Notification Page (Secure chat)  Notification Reason Change in status (Yellow MEWs)  Provider response No new orders  Date of Provider Response 12/04/22  Time of Provider Response 1005  Assess: SIRS CRITERIA  SIRS Temperature  0  SIRS Pulse 1  SIRS Respirations  1  SIRS WBC 0  SIRS Score Sum  2

## 2022-12-04 NOTE — Progress Notes (Signed)
Patient yellow MEWS during shift due to heart rate and respirations. MD Tat made aware. Patient had some coughing episodes during shift patient suctioned orally with yaunker sputum clear. RT notified regarding suction by trach, noted sputum clear and yellow. Patients TF osmolite 1.5 cal rated changed to 40 ml/hour per order. Per MD Tat leave at 40 ml/hour at this time. Tolerating well at this time.

## 2022-12-05 DIAGNOSIS — G9341 Metabolic encephalopathy: Secondary | ICD-10-CM | POA: Diagnosis not present

## 2022-12-05 DIAGNOSIS — Z930 Tracheostomy status: Secondary | ICD-10-CM | POA: Diagnosis not present

## 2022-12-05 LAB — CULTURE, BLOOD (ROUTINE X 2)
Culture: NO GROWTH
Culture: NO GROWTH
Special Requests: ADEQUATE

## 2022-12-05 MED ORDER — MORPHINE SULFATE (PF) 2 MG/ML IV SOLN
2.0000 mg | INTRAVENOUS | Status: DC | PRN
  Administered 2022-12-06 – 2022-12-07 (×2): 2 mg via INTRAVENOUS
  Filled 2022-12-05 (×2): qty 1

## 2022-12-05 NOTE — Progress Notes (Signed)
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  At baseline she is reportedly ambulatory.  She had been experiencing generalized weakness for the past several days and there was a concern that she had a UTI.  There was no fever or chills abdominal pain or chest pain or shortness of breath.    She wears a depends at baseline.  Her G-tube was recently replaced.  All nutrition and medications are given through G-tube.  She was worked up in the ED and discharged back to [facility].  She returned to ED 11/30/2022 with fever of 101.2, persistent weakness shortness of breath emesis and nausea.  They reported that patient has had increasing tracheal secretions, wheezing and coughing.  She has not been able to ambulate which is not her baseline.  Her workup in the ED was suspicious for a pneumonia and she was started on ceftriaxone and azithromycin and admitted for further evaluation and treatment.   Assessment/Plan:  Acute respiratory on chronic failure with hypoxia  -due to aspiration pneumonitis -chronically on 4L trach collar blow by -now up to 10L>>8L>>5L -9/4 CTA chest--neg for PE;  progression of metastatic disease with worsening left axillary, retropectoral, supraclavicular, mediastinal and internal mammary lymphadenopathy -check VBG 7.4/52/41/32 -12/04/2022--overall respiratory status stable/improving; she continues to require frequent suctioning  secondary to frothy thin secretions; oxygen saturations 96-98% on the patient is calm -now transitioned to comfort measures approach   Generalized weakness -Multifactorial including pneumonia, UTI, metastatic cancer, deconditioning -Continue antibiotics -PT evaluation when stable -UA>50 WBC -B12--1809 -Folic acid--10.0 -TSH--8.457, Free T4 0.86   Lobar pneumonia/Aspiration pneumonitis -Personally reviewed chest x-ray--left retrocardiac opacity -Continue ceftriaxone azithromycin--received 5 days -MRSA screen negative -PCT 0.11 -9/6 restart enteral feeding at lower rate -12/04/2022 enteral feedings increased to 40 cc/h with free water flushes   Peritoneal carcinomatosis -12/01/22 CT AP--moderate ascites; no bowel obstruction; new peritoneal implants consistent with peritoneal metastatic disease; new gastrohepatic ligament lymph node -12/03/22--paracentesis 2.4L removed -SAAG <1.1 --restarted enteral feeding>> patient having difficulty tolerating>> developing abdominal pain with escalating enteral feedings -Given discussions with the patient's daughter and patient and transition to comfort care approach>> decision was made to discontinue enteral feedings altogether at this point   UTI--Proteus -Continue ceftriaxone--finished 5 days -UA>50 WBC   Acute metabolic encephalopathy -multifactorial including infection, resp failure -more awake on 9/7, but continues to have intermittent episodes of agitation -unable to perform MRI brain due to aneurysm clips   -check ammonia--26 -B12--1809 -folate 10.0 -TSH 8.457, Free T4--0.80   Chest pain -personally reviewed EKG--sinus, nonspecific TWI -cycle troponin 3>>4 -CTA chest--neg PE   Left breast cancer-stage IIIb -Patient follows up with Stacy Moran - previously Faslodex monthly  -discussed with Stacy Moran a candidate  for active therapy>> recommends palliative care/hospice   Dysphagia -pt uses PEG for all nutrition and  medication -restart enteral feeding at lower rate -Free water flushes   Seizure disorder -resume keppra   Hypothyroidism -resume home levothyroxine    Essential Hypertension  -BP stable off meds  Mood Disorder -resume home medications   Hypermetabolism in the left central uterus:  -she has seen Stacy Moran -unable to perform EMBx due to fibroids -did not feel further eval with surgical intervention is warranted give she is not bleeding and is on aromatase inhibitor   Moderate malnutrition -restarted enteral feeding>> patient having difficulty tolerating>> developing abdominal pain with escalating enteral feedings -Given discussions with the patient's daughter and patient and transition to comfort care approach>> decision was made to discontinue enteral feedings altogether at this point   Goals of Care -multiple discussions with daughter -discussed again at bedside on 12/04/22--updated pt and daughter on all labs and radiographic studies showing progression of disease -discussed overall poor prognosis in the setting of patient's co-morbidities and poor functional baseline and progression of disease -12/04/22 discussed goals of care with the patient's daughter at bedside with the patient.  Patient and daughter both feel that everything that can be done medically has been done.  Stacy Moran opinion has been added for no further active therapy.  Daughter and patient both agree to transition to residential hospice and comfort care. -agrees with DNR           Family Communication:   daughter updated 9/7   Consultants:  med/onc, palliative   Code Status:  FULL    DVT Prophylaxis:   Heparin      Procedures: As Listed in Progress Note Above   Antibiotics: Ceftriaxone 9/3>>9/8 Azithro 9/3>>9/8    Subjective:  Patient complains of some abdominal pain.  She denies any chest pain or shortness of breath.  There is no vomiting today. Objective: Vitals:   12/05/22 0738  12/05/22 1108 12/05/22 1400 12/05/22 1522  BP:   119/85   Pulse:   95   Resp:   (!) 21   Temp:   98.4 F (36.9 C)   TempSrc:   Axillary   SpO2: 99% 97% 96% 98%  Weight:      Height:        Intake/Output Summary (Last 24 hours) at 12/05/2022 1712 Last data filed at 12/05/2022 1308 Gross per 24 hour  Intake 0 ml  Output --  Net 0 ml   Weight change:  Exam:  General:  Pt is alert, follows commands appropriately, not in acute distress HEENT: No icterus, No thrush, No neck mass, [place]/AT Cardiovascular: RRR, S1/S2, no rubs, no gallops Respiratory: scattered rales.  No wheezing. Abdomen: Soft/+BS, mild diffuse tender, non distended, no guarding Extremities: No edema, No lymphangitis, No petechiae, No rashes, no synovitis   Data Reviewed: I have personally reviewed following labs and imaging studies Basic Metabolic Panel: Recent Labs  Lab 11/30/22 1000 12/01/22 0446 12/02/22 0441 12/03/22 0441 12/04/22 0429  NA 137 139 141 140 137  K 3.6 3.7 4.1 4.4 4.3  CL 97* 99 103 103 102  CO2 31 30 27 26 27   GLUCOSE 206* 169* 111* 77 169*  BUN 18 15 7* 11 10  CREATININE 0.71 0.63 0.55 0.64 0.61  CALCIUM 8.9 8.7* 8.6* 8.3* 7.8*  MG  --   --  2.5*  --  2.4  PHOS  --   --   --   --  2.6   Liver Function Tests: Recent Labs  Lab 11/30/22 1000 12/03/22 0441  AST 34 30  ALT 16 18  ALKPHOS 69 63  BILITOT 0.6 0.4  PROT 7.5 6.5  ALBUMIN 3.5 2.8*   No results for input(s): "LIPASE", "AMYLASE" in the last 168 hours. Recent  Labs  Lab 12/02/22 1616  AMMONIA 26   Coagulation Profile: No results for input(s): "INR", "PROTIME" in the last 168 hours. CBC: Recent Labs  Lab 11/29/22 1050 11/30/22 1000 12/01/22 0446 12/02/22 0441 12/03/22 0441 12/04/22 0429  WBC 6.7 7.8 9.5 8.1 7.1 6.1  NEUTROABS 5.0 6.2  --   --   --   --   HGB 12.6 13.0 12.0 12.7 12.3 11.2*  HCT 40.6 41.8 38.7 42.4 41.7 36.9  MCV 81.4 81.0 80.8 82.8 83.7 81.5  PLT 229 225 197 183 175 240   Cardiac  Enzymes: No results for input(s): "CKTOTAL", "CKMB", "CKMBINDEX", "TROPONINI" in the last 168 hours. BNP: Invalid input(s): "POCBNP" CBG: No results for input(s): "GLUCAP" in the last 168 hours. HbA1C: No results for input(s): "HGBA1C" in the last 72 hours. Urine analysis:    Component Value Date/Time   COLORURINE AMBER (A) 11/30/2022 1030   APPEARANCEUR CLOUDY (A) 11/30/2022 1030   LABSPEC 1.024 11/30/2022 1030   PHURINE 7.0 11/30/2022 1030   GLUCOSEU NEGATIVE 11/30/2022 1030   HGBUR SMALL (A) 11/30/2022 1030   BILIRUBINUR NEGATIVE 11/30/2022 1030   KETONESUR NEGATIVE 11/30/2022 1030   PROTEINUR 100 (A) 11/30/2022 1030   NITRITE NEGATIVE 11/30/2022 1030   LEUKOCYTESUR LARGE (A) 11/30/2022 1030   Sepsis Labs: @LABRCNTIP (procalcitonin:4,lacticidven:4) ) Recent Results (from the past 240 hour(s))  Urine Culture     Status: None   Collection Time: 11/29/22  9:57 AM   Specimen: Urine, Catheterized  Result Value Ref Range Status   Specimen Description   Final    URINE, CATHETERIZED Performed at [facility], [address], [place], [place] [address]    Special Requests   Final    NONE Performed at [facility], [address] [place], [place] [address]    Culture   Final    NO GROWTH Performed at [facility], [address] [place], [place] [address]    Report Status 11/30/2022 FINAL  Final  Culture, blood (routine x 2)     Status: None   Collection Time: 11/30/22 10:00 AM   Specimen: BLOOD  Result Value Ref Range Status   Specimen Description BLOOD RIGHT ASSIST CONTROL  Final   Special Requests   Final    BOTTLES DRAWN AEROBIC AND ANAEROBIC Blood Culture results may not be optimal due to an excessive volume of blood received in culture bottles   Culture   Final    NO GROWTH 5 DAYS Performed at [facility], [address] [place], [place] [address]    Report Status 12/05/2022 FINAL  Final  SARS Coronavirus 2 by RT PCR (hospital order, performed in [facility] hospital lab) *cepheid single result test* Anterior Nasal Swab     Status: None   Collection Time: 11/30/22 10:10 AM   Specimen: Anterior Nasal Swab  Result Value Ref Range Status   SARS Coronavirus 2 by RT PCR NEGATIVE NEGATIVE Final    Comment: (NOTE) SARS-CoV-2 target nucleic acids are NOT DETECTED.  The SARS-CoV-2 RNA is generally detectable in upper and lower respiratory specimens during the acute phase of infection. The lowest concentration of SARS-CoV-2 viral copies this assay can detect is 250 copies / mL. A negative result does not preclude SARS-CoV-2 infection and should not be used as the sole basis for treatment or other patient management decisions.  A negative result may occur with improper specimen collection / handling, submission of specimen other than nasopharyngeal swab, presence of viral mutation(s) within the  areas targeted by this assay, and inadequate number of viral copies (<250 copies / mL). A negative result must be combined with clinical observations, patient history, and epidemiological information.  Fact Sheet for Patients:   RoadLapTop.co.za  Fact Sheet for Healthcare Providers: http://kim-miller.com/  This test is not yet approved or  cleared by the [place] FDA and has been authorized for detection and/or diagnosis of SARS-CoV-2 by FDA under an Emergency Use Authorization (EUA).  This EUA will remain in effect (meaning this test can be used) for the duration of the COVID-19 declaration under Section 564(b)(1) of the Act, 21 U.S.C. section 360bbb-3(b)(1), unless the authorization is terminated or revoked sooner.  Performed at [facility], [address], [place], [place] [address]   Urine Culture     Status: Abnormal   Collection Time: 11/30/22 10:30 AM   Specimen: Urine, Random  Result Value Ref Range Status   Specimen Description   Final    URINE, RANDOM Performed at [facility],  [address], [place], [place] [address]    Special Requests   Final    NONE Reflexed from [phone] Performed at [facility], [address], [place], [place] [address]    Culture (A)  Final    >=100,000 COLONIES/mL PROTEUS MIRABILIS WITHIN MIXED ORGANISMS Performed at [facility], [address] [place], [place] [address]    Report Status 12/02/2022 FINAL  Final   Organism ID, Bacteria PROTEUS MIRABILIS (A)  Final      Susceptibility   Proteus mirabilis - MIC*    AMPICILLIN <=2 SENSITIVE Sensitive     CEFAZOLIN <=4 SENSITIVE Sensitive     CEFEPIME <=0.12 SENSITIVE Sensitive     CEFTRIAXONE <=0.25 SENSITIVE Sensitive     CIPROFLOXACIN <=0.25 SENSITIVE Sensitive     GENTAMICIN <=1 SENSITIVE Sensitive     IMIPENEM 2 SENSITIVE Sensitive     NITROFURANTOIN 128 RESISTANT Resistant     TRIMETH/SULFA <=20 SENSITIVE Sensitive     AMPICILLIN/SULBACTAM <=2 SENSITIVE Sensitive     PIP/TAZO <=4 SENSITIVE Sensitive     * >=100,000 COLONIES/mL PROTEUS MIRABILIS  Culture, blood (routine x 2)     Status: None   Collection Time: 11/30/22 10:57 AM   Specimen: BLOOD  Result Value Ref Range Status   Specimen Description BLOOD BLOOD LEFT HAND  Final   Special Requests   Final    BOTTLES DRAWN AEROBIC AND ANAEROBIC Blood Culture adequate volume   Culture   Final    NO GROWTH 5 DAYS Performed at [facility], [address] [place], [place] [address]    Report Status 12/05/2022 FINAL  Final  MRSA Next Gen by PCR, Nasal     Status: None   Collection Time: 11/30/22  5:45 PM   Specimen: Nasal Mucosa; Nasal Swab  Result Value Ref Range Status   MRSA by PCR Next Gen NOT DETECTED NOT DETECTED Final    Comment: (NOTE) The GeneXpert MRSA Assay (FDA approved for NASAL specimens only), is one component of a comprehensive MRSA colonization surveillance program. It is not intended to diagnose MRSA infection nor to guide or monitor treatment for MRSA infections. Test performance is not FDA approved in  patients less than 49 years old. Performed at [facility] [place], [address] [place], [place] [address]   Gram stain     Status: None   Collection Time: 12/03/22  9:43 AM   Specimen: Peritoneal Washings  Result Value Ref Range Status   Specimen Description PERITONEAL  Final   Special Requests NONE  Final   Gram Stain   Final    CYTOSPIN SMEAR WBC PRESENT,BOTH PMN AND MONONUCLEAR NO ORGANISMS SEEN Performed at [facility], [address] [place], [place] [address]    Report Status 12-09-22 FINAL  Final  Culture, body fluid w Gram Stain-bottle     Status: None (Preliminary result)   Collection Time: 2022-12-09  9:43 AM   Specimen: Peritoneal Washings  Result Value Ref Range Status   Specimen Description PERITONEAL  Final   Special Requests 10CC  Final   Culture   Final    NO GROWTH 2 DAYS Performed at [facility], [address] [place], [place] [address]    Report Status PENDING  Incomplete     Scheduled Meds:  ALPRAZolam  0.5 mg Per J Tube BID   free water  150 mL Per Tube Q6H   glycopyrrolate  1 mg Per Tube BID   guaiFENesin  10 mL Per Tube Q8H   influenza vaccine adjuvanted  0.5 mL Intramuscular Tomorrow-1000   ipratropium-albuterol  3 mL Nebulization Q4H   levETIRAcetam  750 mg Per Tube BID   levothyroxine  100 mcg Per Tube Q0600   polyethylene glycol  17 g Per Tube BID   QUEtiapine  50 mg Per Tube BID   scopolamine  1 patch Transdermal Q72H   senna  1 tablet Per Tube Daily   Continuous Infusions:    Procedures/Studies: EEG adult  Result Date: 2022/12/09 Charlsie Quest, MD     2022/12/09  4:59 PM Patient Name: Stacy Moran MRN: [id number] Epilepsy Attending: Charlsie Quest Referring Physician/Provider: Catarina Hartshorn, MD Date: 12-09-22 Duration: 22.50 mins Patient history: 67yo F with ams getting eeg to evaluate for seizure Level of alertness: Awake AEDs during EEG study: Xanax Technical aspects: This EEG study was done with scalp electrodes positioned according to  the 10-20 International system of electrode placement. Electrical activity was reviewed with band pass filter of 1-70Hz , sensitivity of 7 uV/mm, display speed of 73mm/sec with a 60Hz  notched filter applied as appropriate. EEG data were recorded continuously and digitally stored.  Video monitoring was available and reviewed as appropriate. Description: No posterior dominant rhythm was seen. EEG showed continuous generalized 3 to 6 Hz theta-delta slowing. Periodic discharges with triphasic morphology were noted at 0.5-1 hz intermittently. Hyperventilation and photic stimulation were not performed.    ABNORMALITY - Continuous slow, generalized - Periodic discharges with triphasic morphology, generalized  IMPRESSION: This study is suggestive of moderate diffuse encephalopathy, nonspecific etiology.  Periodic discharges with triphasic morphology can be on the ictal-interictal continuum but given the frequency and the morphology, these are more likely secondary to toxic-metabolic causes.  No seizures or definite epileptiform discharges were seen throughout the recording.  Priyanka Annabelle Harman    US Paracentesis  Result Date: 2022-12-09 INDICATION: Malignant ascites EXAM: ULTRASOUND GUIDED  PARACENTESIS MEDICATIONS: None. COMPLICATIONS: None immediate. PROCEDURE: Informed written consent was obtained from the patient after a discussion of the risks, benefits and alternatives to treatment. A timeout was performed prior to the initiation of the procedure. Initial ultrasound scanning demonstrates a small to moderate amount of ascites within the right lower abdominal quadrant. The right lower abdomen was prepped and draped in the usual sterile fashion. 1% lidocaine was used for local anesthesia. Following this, a 19 gauge, 7-cm, Yueh catheter was introduced. An ultrasound image was saved for documentation purposes. The paracentesis was performed. The catheter was removed and a dressing was  applied. The patient tolerated the  procedure well without immediate post procedural complication. FINDINGS: A total of approximately 2.4 L of straw-colored fluid was removed. Samples were sent to the laboratory as requested by the clinical team. IMPRESSION: Successful ultrasound-guided paracentesis yielding 2.4 liters of peritoneal fluid. Electronically Signed   By: Acquanetta Belling M.D.   On: 12/03/2022 11:02   CT ABDOMEN PELVIS WO CONTRAST  Result Date: 12/02/2022 CLINICAL DATA:  Stage III C left breast cancer with evidence of stage IV disease on CTA chest earlier today with upper abdominal peritoneal implants and progressive adenopathy. Bowel obstruction suspected with abdominal pain and distention. EXAM: CT ABDOMEN AND PELVIS WITHOUT CONTRAST TECHNIQUE: Multidetector CT imaging of the abdomen and pelvis was performed following the standard protocol without IV contrast. RADIATION DOSE REDUCTION: This exam was performed according to the departmental dose-optimization program which includes automated exposure control, adjustment of the mA and/or kV according to patient size and/or use of iterative reconstruction technique. COMPARISON:  CTA chest today at 9:33 a.m., PET-CT studies dated 08/26/2022 and 06/03/2022. FINDINGS: Lower chest: Again noted are bilateral symmetric minimal small layering pleural effusions with adjacent atelectasis. Lung bases are clear of infiltrates. Mild cardiomegaly. No pericardial effusion is seen. There is two-vessel trace calcific plaque in the LAD and right coronary arteries. Hepatobiliary: The liver is 18 cm length. No focal abnormality is seen without contrast. Gallbladder and bile ducts are unremarkable. Pancreas: Partially atrophic. Otherwise unremarkable without contrast. Spleen: There is homogeneous noncontrast attenuation. No masses seen without contrast. No splenomegaly. Adrenals/Urinary Tract: Slight chronic nodular thickening left adrenal gland. Unremarkable right adrenal gland and unenhanced bilateral kidneys.  There is contrast in the collecting systems ureters. There is contrast in the bladder. Bladder wall and lumen are unremarkable. Stomach/Bowel: There is a PEG tube adequately positioned with the balloon in the body of the stomach. Opacified and unopacified small bowel segments are normal caliber and the gastric wall is normal thickness. The normal appendix is well seen. The large intestine wall unremarkable with mild fecal stasis. Vascular/Lymphatic: Minimal scattered aortic calcific plaque with mild calcifications of the iliac arteries. No AAA. There is new demonstration of a nonspecific mildly enlarged gastrohepatic ligament lymph node measuring 1.2 cm short axis on 3:32. There is no other appreciable adenopathy but there have developed multiple peritoneal implants consistent with peritoneal metastatic disease, since 08/26/2022. There are several of these anterior to the liver. Largest of these is 3.2 x 1.8 cm on 3:19. Another is just anterior to the lateral segment of the left lobe of the liver measuring 1.7 x 1.4 cm on 3:34. Smaller peritoneal implants are present anterior to the distal stomach, with a larger lesion along the lesser curve of the stomach measuring 3.1 x 2.8 cm on 3:37. Additional smaller scattered omental/peritoneal implants are present in the anterior left mid to lower abdomen. Reproductive: Enlarged fibroid uterus again noted with multiple calcified and partially calcified fibroids. 3.3 cm left adnexal cyst is unchanged. No other adnexal abnormality. Pelvic phleboliths. Other: Moderate free ascites is present. Largest collection is left lower abdominopelvic quadrant. The ascites is probably malignant in etiology and measures between about 18 and 26 Hounsfield units. There is a VP shunt in the abdomen with the tip in the proximal left paracolic gutter. This was present previously. There is no free air or free hemorrhage, nor focal inflammatory process. Small umbilical fat hernia. Musculoskeletal:  No regional bone metastasis is seen. There is degenerative change of the lumbar spine with advanced facet arthrosis  L3-4 and L4-5 with acquired spinal stenosis both levels. IMPRESSION: 1. No evidence of bowel obstruction or inflammatory change. 2. Moderate free ascites, probably malignant in etiology. 3. Multiple new peritoneal implants consistent with peritoneal metastatic disease. 4. New mildly enlarged gastrohepatic ligament lymph node. 5. Enlarged fibroid uterus with stable 3.3 cm left adnexal cyst. 6. Constipation and diverticulosis. 7. Aortic and coronary artery atherosclerosis.  Mild cardiomegaly. 8. Minimal layering pleural effusions with adjacent atelectasis. 9. PEG tube and VP shunt. 10. Advanced facet arthrosis L3-4 and L4-5 with acquired spinal stenosis at both levels. Electronically Signed   By: Almira Bar M.D.   On: 12/02/2022 00:26   CT Angio Chest Pulmonary Embolism (PE) W or WO Contrast  Result Date: 12/01/2022 CLINICAL DATA:  Chest pain. Elevated D-dimer. History of breast cancer. Evaluate for pulmonary embolism. EXAM: CT ANGIOGRAPHY CHEST WITH CONTRAST TECHNIQUE: Multidetector CT imaging of the chest was performed using the standard protocol during bolus administration of intravenous contrast. Multiplanar CT image reconstructions and MIPs were obtained to evaluate the vascular anatomy. RADIATION DOSE REDUCTION: This exam was performed according to the departmental dose-optimization program which includes automated exposure control, adjustment of the mA and/or kV according to patient size and/or use of iterative reconstruction technique. CONTRAST:  75mL OMNIPAQUE IOHEXOL 350 MG/ML SOLN COMPARISON:  Chest CT-06/01/2021 PET-CT-06/11/2021 FINDINGS: Vascular Findings: There is adequate opacification of the pulmonary arterial system with the main pulmonary artery measuring 346 Hounsfield units. There are no discrete filling defects within the pulmonary arterial tree to suggest pulmonary embolism.  Normal caliber of the main pulmonary artery. Cardiomegaly.  No pericardial effusion. No evidence of thoracic aortic aneurysm or dissection on this nongated examination. Bovine configuration of the aortic arch. The branch vessels of the aortic arch are tortuous though widely patent throughout their imaged courses. Review of the MIP images confirms the above findings. ---------------------------------------------------------------------------------- Nonvascular Findings: Mediastinum/Lymph Nodes: Progressive left axillary, supraclavicular and retro pectoral lymphadenopathy with index left axillary lymph node measuring 2 cm in greatest short axis diameter (image 30, series 4), previously, 1.2 cm when compared to PET-CT performed 06/11/2021. Index left supraclavicular lymph node now measures 1.4 cm and subpectoral lymph node measures 1.3 cm (image 19, series 4). Interval development of borderline enlarged mediastinal and internal mammary lymphadenopathy with index prevascular lymph node measuring 0.6 cm (image 27, series 4) and index right internal mamillary chain lymph node measuring 0.7 cm (image 38, series 4). No definitive hilar or right axillary lymphadenopathy. Lungs/Pleura: Tracheostomy tube tip terminates superior to the carina. There is a minimal amount of nonocclusive debris within the right mainstem bronchus. The central pulmonary airways appear widely patent. Interval development of trace bilateral pleural effusions. Evaluation of the pulmonary parenchyma is degraded secondary to patient respiratory artifact. Redemonstrated apical predominant centrilobular emphysematous change. Minimal dependent subpleural ground-glass consolidative atelectasis. No associated air bronchograms. No pneumothorax. Upper abdomen: Limited arterial phase evaluation of the upper abdomen demonstrates multiple peritoneal implants and masses with index mass within the right upper abdominal quadrant measuring 3.4 x 2.2 cm (image 71,  series 4. This finding is associated with development of small amount of intra-abdominal ascites. Musculoskeletal: No definite acute or aggressive osseous abnormalities. Regional soft tissues appear normal. IMPRESSION: 1. No acute cardiopulmonary disease. Specifically, no evidence of pulmonary embolism. 2. Findings compatible with progression of metastatic disease with worsening left axillary, retropectoral, supraclavicular, mediastinal and internal mammary lymphadenopathy and development of multiple peritoneal implants within the imaged right upper abdominal quadrant with small volume intra-abdominal ascites. 3. Interval  development of trace bilateral pleural effusions. 4. Cardiomegaly. Electronically Signed   By: Simonne Come M.D.   On: 12/01/2022 10:37   DG Chest Portable 1 View  Result Date: 11/30/2022 CLINICAL DATA:  Cough.  History of breast cancer. EXAM: PORTABLE CHEST 1 VIEW COMPARISON:  None Available. FINDINGS: Film is rotated to the left. Tracheostomy tube in place. Presumed vertically oriented catheter for VP shunt along the right hemithorax. Normal cardiopericardial silhouette when adjusting for technique. No pneumothorax or edema. Question left retrocardiac opacity versus related to the rotation. Overlapping cardiac leads. IMPRESSION: Tracheostomy tube.  Presumed VP shunt. Rotated radiograph to the left with some left retrocardiac opacity. Subtle infiltrate or effusion is possible. No pneumothorax or edema. Electronically Signed   By: Karen Kays M.D.   On: 11/30/2022 12:23   DG Abd 2 Views  Result Date: 11/29/2022 CLINICAL DATA:  Abdominal distention. EXAM: ABDOMEN - 2 VIEW COMPARISON:  PET-CT dated 08/26/2022. FINDINGS: The bowel gas pattern is normal. There is no evidence of free air. A gastrostomy tube is noted. A catheter overlies the right chest, right abdomen and left abdomen, consistent with a ventriculoperitoneal catheter. A calcified fibroid is seen in the pelvis. Degenerative changes  are seen in the spine. IMPRESSION: Nonobstructive bowel gas pattern. Electronically Signed   By: Romona Curls M.D.   On: 11/29/2022 12:40    Catarina Hartshorn, DO  [facility]  If 7PM-7AM, please contact night-coverage [email or web address] Password [email or web address] 12/05/2022, 5:12 PM   LOS: 4 days

## 2022-12-05 NOTE — Progress Notes (Signed)
Patient has taken off continuous pulse oximeter several time. Explained to patient that she needs to keep it on her finger. Patient nods her head in agreement. Plan of care ongoing.

## 2022-12-05 NOTE — Plan of Care (Signed)

## 2022-12-05 NOTE — Progress Notes (Signed)
Per MD on call, ok to hold feeding until 10 am due to pain.

## 2022-12-05 NOTE — Progress Notes (Signed)
Noticed that pt hasn't voided today. This nurse did a bladder scan and it showed > 598 cc. Notified MD. N.O for foley.

## 2022-12-05 NOTE — Progress Notes (Addendum)
Patient continues to complain of abdominal pain, MD on call notified, prn medication given. Requested to hold feedings. Awaiting new orders

## 2022-12-06 DIAGNOSIS — Z930 Tracheostomy status: Secondary | ICD-10-CM | POA: Diagnosis not present

## 2022-12-06 DIAGNOSIS — J9621 Acute and chronic respiratory failure with hypoxia: Secondary | ICD-10-CM | POA: Diagnosis not present

## 2022-12-06 DIAGNOSIS — J690 Pneumonitis due to inhalation of food and vomit: Secondary | ICD-10-CM | POA: Diagnosis not present

## 2022-12-06 DIAGNOSIS — E440 Moderate protein-calorie malnutrition: Secondary | ICD-10-CM | POA: Diagnosis not present

## 2022-12-06 DIAGNOSIS — Z7189 Other specified counseling: Secondary | ICD-10-CM | POA: Diagnosis not present

## 2022-12-06 DIAGNOSIS — G9341 Metabolic encephalopathy: Secondary | ICD-10-CM | POA: Diagnosis not present

## 2022-12-06 DIAGNOSIS — I69891 Dysphagia following other cerebrovascular disease: Secondary | ICD-10-CM

## 2022-12-06 DIAGNOSIS — Z515 Encounter for palliative care: Secondary | ICD-10-CM

## 2022-12-06 MED ORDER — IPRATROPIUM-ALBUTEROL 0.5-2.5 (3) MG/3ML IN SOLN
3.0000 mL | Freq: Four times a day (QID) | RESPIRATORY_TRACT | Status: DC
  Administered 2022-12-06 – 2022-12-07 (×3): 3 mL via RESPIRATORY_TRACT
  Filled 2022-12-06 (×2): qty 3

## 2022-12-06 NOTE — Plan of Care (Signed)

## 2022-12-06 NOTE — Progress Notes (Signed)
Nutrition Brief Note  Chart reviewed. Patient has transitioned to comfort care with plans for residential hospice. Advancement of enteral feedings was not tolerated, so it has been discontinued. No nutrition interventions planned at this time. Please re-consult as needed.   Gabriel Rainwater RD, LDN, CNSC Please refer to Amion for contact information.

## 2022-12-06 NOTE — TOC Progression Note (Addendum)
Transition of Care [facility]) - Progression Note    Patient Details  Name: Stacy Moran MRN: [id number] Date of Birth: 1955-10-20  Transition of Care [facility]) CM/SW Contact  Villa Herb, [place] Phone Number: 12/06/2022, 11:31 AM  Clinical Narrative:    CSW updated by hospice RN with [facility] that pts daughter is requesting hospice placement in [place] if possible. [facility] RN states pt is a candidate for their residential facility and to let them know if family needs a bed. CSW spoke with pts daughter who confirms this and is agreeable to [facility] hospice referral. CSW made call and left VM for [facility] requesting call back. TOC to follow.   Addendum 12pm: CSW spoke to [facility] rep to provide referral, he will review and speak with pts daughter. TOC to follow.   Expected Discharge Plan: Long Term Nursing Home Barriers to Discharge: Continued Medical Work up  Expected Discharge Plan and Services In-house Referral: Clinical Social Work Discharge Planning Services: CM Consult Post Acute Care Choice: Nursing Home Living arrangements for the past 2 months: Skilled Nursing Facility                                       Social Determinants of Health (SDOH) Interventions SDOH Screenings   Food Insecurity: No Food Insecurity (11/30/2022)  Housing: Medium Risk (11/30/2022)  Transportation Needs: No Transportation Needs (11/30/2022)  Utilities: Not At Risk (11/30/2022)  Depression (PHQ2-9): Low Risk  (08/08/2020)  Recent Concern: Depression (PHQ2-9) - Medium Risk (06/05/2020)  Tobacco Use: Medium Risk (12/03/2022)    Readmission Risk Interventions    12/31/2020    3:40 PM  Readmission Risk Prevention Plan  Transportation Screening Complete  Medication Review (RN Care Manager) Complete  PCP or Specialist appointment within 3-5 days of discharge Complete  HRI or Home Care Consult Complete  SW Recovery Care/Counseling Consult Complete  Palliative Care Screening Not Applicable   Skilled Nursing Facility Complete

## 2022-12-06 NOTE — Plan of Care (Signed)

## 2022-12-06 NOTE — Progress Notes (Signed)
[facility] Liaison Note  Received request from Stacy Moran, for hospice Inpatient Unit services.   Visited patient in hospital. Patient is resting without distress. Introduced self and purpose for visit. Unable to discuss with patient due to trach and inability to verbalize. Assured patient that I would discuss further with her family.   Attempted to contact daughter, Stacy Moran, and voicemail left requesting a return call.   Will follow up with contact attempts tomorrow.   Thank you for the opportunity to participate in the care of this patient.   Glenna Fellows BSN, Charity fundraiser, OCN [employer] [phone]

## 2022-12-06 NOTE — Progress Notes (Signed)
Daily Progress Note   Patient Name: Stacy Moran       Date: 12/06/2022 DOB: 12/23/1955  Age: 67 y.o. MRN#: [id number] Attending Physician: Catarina Hartshorn, MD Primary Care Physician: Sherol Dade, DO Admit Date: 11/30/2022  Reason for Consultation/Follow-up: Establishing goals of care  Patient Profile/HPI:  67 y.o. 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 with shortness of breath and pneumonia and concern for UTI. CT scans concerning for progression of cancer with peritoneal carcinamatosis. Per Oncology- not candidate for more cancer treatment.  Was being treated for aspiration pneumonitis. Continued to have ongoing aspiration during admission. Transitioned to comfort care by attending team.     Subjective: Chart reviewed including labs, progress notes, imaging from this and previous encounters.  She is awaiting eval for [facility]. Was initially accepted to [facility], however, daughter would prefer her to be closer to her in [place].  On eval patient awake, no complaints- feels symptoms are being well managed.  Daughter at bedside. Emotional support provided.    Physical Exam Vitals and nursing note reviewed.  Constitutional:      Appearance: She is ill-appearing.  Pulmonary:     Effort: Pulmonary effort is normal.     Comments: Trach  in place Neurological:     Mental Status: She is alert.             Vital Signs: BP 121/82 (BP Location: Left Arm)   Pulse (!) 110   Temp (!) 100.4 F (38 C) (Axillary)   Resp 16    Ht 5\' 6"  (1.676 m)   Wt 72.5 kg   SpO2 100%   BMI 25.80 kg/m  SpO2: SpO2: 100 % O2 Device: O2 Device: Tracheostomy Collar O2 Flow Rate: O2 Flow Rate (L/min): 5 L/min  Intake/output summary:  Intake/Output Summary (Last 24 hours) at 12/06/2022 1605 Last data filed at 12/06/2022 0620 Gross per 24 hour  Intake 700 ml  Output 850 ml  Net -150 ml   LBM: Last BM Date : 12/04/22 Baseline Weight: Weight: 72.5 kg Most recent weight: Weight: 72.5 kg  Palliative Assessment/Data: PPS: 10%      Patient Active Problem List   Diagnosis Date Noted   Goals of care, counseling/discussion 12/04/2022   Acute on chronic respiratory failure with hypoxia (HCC) 12/02/2022   Aspiration pneumonitis (HCC) 12/02/2022   Acute metabolic encephalopathy 12/02/2022   Chronic respiratory failure with hypoxia (HCC) 12/01/2022   Lobar pneumonia (HCC) 12/01/2022   Malnutrition of moderate degree 12/01/2022   Pneumonia 11/30/2022   Generalized weakness 11/30/2022   History of breast cancer 11/30/2022   Genetic testing 06/21/2022   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   Breast cancer metastasized to intrathoracic lymph node, left (HCC) 06/28/2021   Mass of upper outer quadrant of left breast    Altered mental status 06/01/2021   Cardiac arrest (HCC) 06/01/2021   Abnormal CT of the chest 06/01/2021   Bilateral vocal cord paralysis 01/14/2021   Fall (on) (from) other stairs and steps, initial encounter 08/26/2020   Subdural hematoma (HCC) 07/31/2020   History of hemorrhagic cerebrovascular accident (CVA) with residual deficit 06/06/2020   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   S/P ventriculoperitoneal shunt 06/06/2020   Functional urinary incontinence 06/06/2020   Ineffective airway clearance    Restlessness    Restlessness and agitation    Acute blood loss anemia    Essential hypertension    Seizure prophylaxis    Protein-calorie malnutrition, severe 04/24/2020   Dysphagia, post-stroke     Dysphagia    Status post tracheostomy (HCC)    S/P percutaneous endoscopic gastrostomy (PEG) tube placement (HCC)    ICH (intracerebral hemorrhage) (HCC) 04/22/2020   Obstructive hydrocephalus (HCC)    Seizure-like activity (HCC) 03/12/2020   PEG (percutaneous endoscopic gastrostomy) status (HCC) 03/08/2020   Hyperglycemia 03/08/2020   Acute respiratory failure (HCC)    Ventilator dependence (HCC)    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/19/2020   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/19/2020   Tracheostomy status (HCC) 02/19/2020   Ruptured aneurysm of artery (HCC)    SAH (subarachnoid hemorrhage) (HCC) history of    Prediabetes    Hypokalemia    Pressure injury of skin 02/13/2020   Ruptured cerebral aneurysm (HCC) 01/30/2020    Palliative Care Assessment & Plan    Assessment/Recommendations/Plan  Continue current comfort interventions Awaiting possible placement at inpatient hospice facility   Code Status: DNR  Prognosis:  < 2 weeks  Discharge Planning: To Be Determined  Care plan was discussed with patient's family  Thank you for allowing the Palliative Medicine Team to assist in the care of this patient.   Greater than 50%  of this time was spent counseling and coordinating care related to the above assessment and plan.  Ocie Bob, AGNP-C Palliative Medicine   Please contact Palliative Medicine Team phone at [phone] for questions and concerns.

## 2022-12-06 NOTE — Progress Notes (Signed)
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  At baseline she is reportedly ambulatory.  She had been experiencing generalized weakness for the past several days and there was a concern that she had a UTI.  There was no fever or chills abdominal pain or chest pain or shortness of breath.    She wears a depends at baseline.  Her G-tube was recently replaced.  All nutrition and medications are given through G-tube.  She was worked up in the ED and discharged back to [facility].  She returned to ED 11/30/2022 with fever of 101.2, persistent weakness shortness of breath emesis and nausea.  They reported that patient has had increasing tracheal secretions, wheezing and coughing.  She has not been able to ambulate which is not her baseline.  Her workup in the ED was suspicious for a pneumonia and she was started on ceftriaxone and azithromycin and admitted for further evaluation and treatment.   Assessment/Plan:   Acute respiratory on chronic failure with hypoxia  -due to aspiration pneumonitis -chronically on 4L trach collar blow by -now up to 10L>>8L>>5L -9/4 CTA chest--neg for PE;  progression of metastatic disease with worsening left axillary, retropectoral, supraclavicular, mediastinal and internal mammary lymphadenopathy -check VBG 7.4/52/41/32 -12/04/2022--overall respiratory status stable/improving; she continues to require frequent suctioning  secondary to frothy thin secretions; oxygen saturations 96-98% on the patient is calm -now transitioned to comfort measures approach   Generalized weakness -Multifactorial including pneumonia, UTI, metastatic cancer, deconditioning -Continue antibiotics -PT evaluation when stable -UA>50 WBC -B12--1809 -Folic acid--10.0 -TSH--8.457, Free T4 0.86   Lobar pneumonia/Aspiration pneumonitis -Personally reviewed chest x-ray--left retrocardiac opacity -Continue ceftriaxone azithromycin--received 5 days -MRSA screen negative -PCT 0.11 -9/6 restart enteral feeding at lower rate -12/04/2022 enteral feedings increased to 40 cc/h with free water flushes   Peritoneal carcinomatosis -12/01/22 CT AP--moderate ascites; no bowel obstruction; new peritoneal implants consistent with peritoneal metastatic disease; new gastrohepatic ligament lymph node -12/03/22--paracentesis 2.4L removed -SAAG <1.1 --restarted enteral feeding>> patient having difficulty tolerating>> developing abdominal pain with escalating enteral feedings -Given discussions with the patient's daughter and patient and transition to comfort care approach>> decision was made to discontinue enteral feedings altogether at this point   UTI--Proteus -Continue ceftriaxone--finished 5 days -UA>50 WBC   Acute metabolic encephalopathy -multifactorial including infection, resp failure -more awake on 9/7, but continues to have intermittent episodes of agitation -unable to perform MRI brain due to aneurysm clips   -check ammonia--26 -B12--1809 -folate 10.0 -TSH 8.457, Free T4--0.80   Chest pain -personally reviewed EKG--sinus, nonspecific TWI -cycle troponin 3>>4 -CTA chest--neg PE   Left breast cancer-stage IIIb -Patient follows up with Dr. Ellin Saba - previously Faslodex monthly  -discussed with Dr. Ernestine Conrad a candidate  for active therapy>> recommends palliative care/hospice   Dysphagia -pt uses PEG for all nutrition and  medication -restart enteral feeding at lower rate -Free water flushes   Seizure disorder -resume keppra   Hypothyroidism -resume home levothyroxine    Essential Hypertension  -BP stable off  meds   Mood Disorder -resume home medications   Hypermetabolism in the left central uterus:  -she has seen Dr. Despina Hidden -unable to perform EMBx due to fibroids -did not feel further eval with surgical intervention is warranted give she is not bleeding and is on aromatase inhibitor   Moderate malnutrition -restarted enteral feeding>> patient having difficulty tolerating>> developing abdominal pain with escalating enteral feedings -Given discussions with the patient's daughter and patient and transition to comfort care approach>> decision was made to discontinue enteral feedings altogether at this point   Goals of Care -multiple discussions with daughter -discussed again at bedside on 12/04/22--updated pt and daughter on all labs and radiographic studies showing progression of disease -discussed overall poor prognosis in the setting of patient's co-morbidities and poor functional baseline and progression of disease -12/04/22 discussed goals of care with the patient's daughter at bedside with the patient.  Patient and daughter both feel that everything that can be done medically has been done.  Dr. Marice Potter opinion has been added for no further active therapy.  Daughter and patient both agree to transition to residential hospice and comfort care. -agrees hospice eval -daughter prefers closer to [facility] -awaiting acceptance at [facility]           Family Communication:   daughter updated 9/8   Consultants:  med/onc, palliative   Code Status:  FULL    DVT Prophylaxis:  Nelson Heparin      Procedures: As Listed in Progress Note Above   Antibiotics: Ceftriaxone 9/3>>9/8 Azithro 9/3>>9/8    Subjective: Patient denies fevers, chills, headache, chest pain, dyspnea, nausea, vomiting, diarrhea,  abdominal pain, dysuria, hematuria,   Objective: Vitals:   12/06/22 0716 12/06/22 1103 12/06/22 1404 12/06/22 1554  BP:   121/82   Pulse: 88  (!) 110   Resp: 18 16 16 16   Temp:   (!) 100.4 F (38 C)   TempSrc:   Axillary   SpO2: 96% 96% 100% 100%  Weight:      Height:        Intake/Output Summary (Last 24 hours) at 12/06/2022 1740 Last data filed at 12/06/2022 0620 Gross per 24 hour  Intake 700 ml  Output 850 ml  Net -150 ml   Weight change:  Exam:  General:  Pt is alert, follows commands appropriately, not in acute distress HEENT: No icterus, No thrush, No neck mass, /AT Cardiovascular: RRR, S1/S2, no rubs, no gallops Respiratory: bibasilar rales.  No wheeze Abdomen: Soft/+BS, non tender, non distended, no guarding Extremities: No edema, No lymphangitis, No petechiae, No rashes, no synovitis   Data Reviewed: I have personally reviewed following labs and imaging studies Basic Metabolic Panel: Recent Labs  Lab 11/30/22 1000 12/01/22 0446 12/02/22 0441 12/03/22 0441 12/04/22 0429  NA 137 139 141 140 137  K 3.6 3.7 4.1 4.4 4.3  CL 97* 99 103 103 102  CO2 31 30 27 26 27   GLUCOSE 206* 169* 111* 77 169*  BUN 18 15 7* 11 10  CREATININE 0.71 0.63 0.55 0.64 0.61  CALCIUM 8.9 8.7* 8.6* 8.3* 7.8*  MG  --   --  2.5*  --  2.4  PHOS  --   --   --   --  2.6   Liver Function Tests: Recent Labs  Lab 11/30/22 1000 12/03/22 0441  AST 34 30  ALT 16 18  ALKPHOS 69 63  BILITOT 0.6 0.4  PROT 7.5 6.5  ALBUMIN 3.5 2.8*   No results for input(s): "  LIPASE", "AMYLASE" in the last 168 hours. Recent Labs  Lab 12/02/22 1616  AMMONIA 26   Coagulation Profile: No results for input(s): "INR", "PROTIME" in the last 168 hours. CBC: Recent Labs  Lab 11/30/22 1000 12/01/22 0446 12/02/22 0441 12/03/22 0441 12/04/22 0429  WBC 7.8 9.5 8.1 7.1 6.1  NEUTROABS 6.2  --   --   --   --   HGB 13.0 12.0 12.7 12.3 11.2*  HCT 41.8 38.7 42.4 41.7 36.9  MCV 81.0 80.8 82.8 83.7 81.5   PLT 225 197 183 175 240   Cardiac Enzymes: No results for input(s): "CKTOTAL", "CKMB", "CKMBINDEX", "TROPONINI" in the last 168 hours. BNP: Invalid input(s): "POCBNP" CBG: No results for input(s): "GLUCAP" in the last 168 hours. HbA1C: No results for input(s): "HGBA1C" in the last 72 hours. Urine analysis:    Component Value Date/Time   COLORURINE AMBER (A) 11/30/2022 1030   APPEARANCEUR CLOUDY (A) 11/30/2022 1030   LABSPEC 1.024 11/30/2022 1030   PHURINE 7.0 11/30/2022 1030   GLUCOSEU NEGATIVE 11/30/2022 1030   HGBUR SMALL (A) 11/30/2022 1030   BILIRUBINUR NEGATIVE 11/30/2022 1030   KETONESUR NEGATIVE 11/30/2022 1030   PROTEINUR 100 (A) 11/30/2022 1030   NITRITE NEGATIVE 11/30/2022 1030   LEUKOCYTESUR LARGE (A) 11/30/2022 1030   Sepsis Labs: @LABRCNTIP (procalcitonin:4,lacticidven:4) ) Recent Results (from the past 240 hour(s))  Urine Culture     Status: None   Collection Time: 11/29/22  9:57 AM   Specimen: Urine, Catheterized  Result Value Ref Range Status   Specimen Description   Final    URINE, CATHETERIZED Performed at [facility], [address] [place], [place] [address]    Special Requests   Final    NONE Performed at [facility], [address], [place], [place] [address]    Culture   Final    NO GROWTH Performed at [facility], [address] [place], [place] [address]    Report Status 11/30/2022 FINAL  Final  Culture, blood (routine x 2)     Status: None   Collection Time: 11/30/22 10:00 AM   Specimen: BLOOD  Result Value Ref Range Status   Specimen Description BLOOD RIGHT ASSIST CONTROL  Final   Special Requests   Final    BOTTLES DRAWN AEROBIC AND ANAEROBIC Blood Culture results may not be optimal due to an excessive volume of blood received in culture bottles   Culture   Final    NO GROWTH 5 DAYS Performed at [facility], [address] [place], [place] [address]    Report Status 12/05/2022 FINAL  Final  SARS Coronavirus 2 by RT PCR  (hospital order, performed in [facility] hospital lab) *cepheid single result test* Anterior Nasal Swab     Status: None   Collection Time: 11/30/22 10:10 AM   Specimen: Anterior Nasal Swab  Result Value Ref Range Status   SARS Coronavirus 2 by RT PCR NEGATIVE NEGATIVE Final    Comment: (NOTE) SARS-CoV-2 target nucleic acids are NOT DETECTED.  The SARS-CoV-2 RNA is generally detectable in upper and lower respiratory specimens during the acute phase of infection. The lowest concentration of SARS-CoV-2 viral copies this assay can detect is 250 copies / mL. A negative result does not preclude SARS-CoV-2 infection and should not be used as the sole basis for treatment or other patient management decisions.  A negative result may occur with improper specimen collection / handling, submission of specimen other than nasopharyngeal swab, presence of viral mutation(s) within the  areas targeted by this assay, and inadequate number of viral copies (<250 copies / mL). A negative result must be combined with clinical observations, patient history, and epidemiological information.  Fact Sheet for Patients:   RoadLapTop.co.za  Fact Sheet for Healthcare Providers: http://kim-miller.com/  This test is not yet approved or  cleared by the [place] FDA and has been authorized for detection and/or diagnosis of SARS-CoV-2 by FDA under an Emergency Use Authorization (EUA).  This EUA will remain in effect (meaning this test can be used) for the duration of the COVID-19 declaration under Section 564(b)(1) of the Act, 21 U.S.C. section 360bbb-3(b)(1), unless the authorization is terminated or revoked sooner.  Performed at [facility], [address], [place], [place] [address]   Urine Culture     Status: Abnormal   Collection Time: 11/30/22 10:30 AM   Specimen: Urine, Random  Result Value Ref Range Status   Specimen Description   Final    URINE,  RANDOM Performed at [facility], [address] [place], [place] [address]    Special Requests   Final    NONE Reflexed from [id number] Performed at [facility], [address] [place], [place] [address]    Culture (A)  Final    >=100,000 COLONIES/mL PROTEUS MIRABILIS WITHIN MIXED ORGANISMS Performed at [facility], [address], [place], [place] [address]    Report Status 12/02/2022 FINAL  Final   Organism ID, Bacteria PROTEUS MIRABILIS (A)  Final      Susceptibility   Proteus mirabilis - MIC*    AMPICILLIN <=2 SENSITIVE Sensitive     CEFAZOLIN <=4 SENSITIVE Sensitive     CEFEPIME <=0.12 SENSITIVE Sensitive     CEFTRIAXONE <=0.25 SENSITIVE Sensitive     CIPROFLOXACIN <=0.25 SENSITIVE Sensitive     GENTAMICIN <=1 SENSITIVE Sensitive     IMIPENEM 2 SENSITIVE Sensitive     NITROFURANTOIN 128 RESISTANT Resistant     TRIMETH/SULFA <=20 SENSITIVE Sensitive     AMPICILLIN/SULBACTAM <=2 SENSITIVE Sensitive     PIP/TAZO <=4 SENSITIVE Sensitive     * >=100,000 COLONIES/mL PROTEUS MIRABILIS  Culture, blood (routine x 2)     Status: None   Collection Time: 11/30/22 10:57 AM   Specimen: BLOOD  Result Value Ref Range Status   Specimen Description BLOOD BLOOD LEFT HAND  Final   Special Requests   Final    BOTTLES DRAWN AEROBIC AND ANAEROBIC Blood Culture adequate volume   Culture   Final    NO GROWTH 5 DAYS Performed at [facility], [address] [place], [place] [address]    Report Status 12/05/2022 FINAL  Final  MRSA Next Gen by PCR, Nasal     Status: None   Collection Time: 11/30/22  5:45 PM   Specimen: Nasal Mucosa; Nasal Swab  Result Value Ref Range Status   MRSA by PCR Next Gen NOT DETECTED NOT DETECTED Final    Comment: (NOTE) The GeneXpert MRSA Assay (FDA approved for NASAL specimens only), is one component of a comprehensive MRSA colonization surveillance program. It is not intended to diagnose MRSA infection nor to guide or monitor treatment for MRSA  infections. Test performance is not FDA approved in patients less than 34 years old. Performed at [facility], [address] [place], [place] [address]   Gram stain     Status: None   Collection Time: 12/03/22  9:43 AM   Specimen: Peritoneal Washings  Result Value Ref Range Status   Specimen Description PERITONEAL  Final   Special Requests NONE  Final   Gram Stain   Final    CYTOSPIN SMEAR WBC PRESENT,BOTH PMN AND MONONUCLEAR NO ORGANISMS SEEN Performed at [facility], [address] [place], [place] [address]    Report Status 12/12/2022 FINAL  Final  Culture, body fluid w Gram Stain-bottle     Status: None (Preliminary result)   Collection Time: Dec 12, 2022  9:43 AM   Specimen: Peritoneal Washings  Result Value Ref Range Status   Specimen Description PERITONEAL  Final   Special Requests 10CC  Final   Culture   Final    NO GROWTH 3 DAYS Performed at [facility], [address], [place], [place] [address]    Report Status PENDING  Incomplete     Scheduled Meds:  ALPRAZolam  0.5 mg Per J Tube BID   free water  150 mL Per Tube Q6H   glycopyrrolate  1 mg Per Tube BID   guaiFENesin  10 mL Per Tube Q8H   influenza vaccine adjuvanted  0.5 mL Intramuscular Tomorrow-1000   ipratropium-albuterol  3 mL Nebulization Q6H   levETIRAcetam  750 mg Per Tube BID   QUEtiapine  50 mg Per Tube BID   scopolamine  1 patch Transdermal Q72H   Continuous Infusions:  Procedures/Studies: EEG adult  Result Date: 12/12/2022 Charlsie Quest, MD     12-12-22  4:59 PM Patient Name: Stacy Moran MRN: [id number] Epilepsy Attending: Charlsie Quest Referring Physician/Provider: Catarina Hartshorn, MD Date: Dec 12, 2022 Duration: 22.50 mins Patient history: 67yo F with ams getting eeg to evaluate for seizure Level of alertness: Awake AEDs during EEG study: Xanax Technical aspects: This EEG study was done with scalp electrodes positioned according to the 10-20 International system of electrode placement. Electrical  activity was reviewed with band pass filter of 1-70Hz , sensitivity of 7 uV/mm, display speed of 32mm/sec with a 60Hz  notched filter applied as appropriate. EEG data were recorded continuously and digitally stored.  Video monitoring was available and reviewed as appropriate. Description: No posterior dominant rhythm was seen. EEG showed continuous generalized 3 to 6 Hz theta-delta slowing. Periodic discharges with triphasic morphology were noted at 0.5-1 hz intermittently. Hyperventilation and photic stimulation were not performed.    ABNORMALITY - Continuous slow, generalized - Periodic discharges with triphasic morphology, generalized  IMPRESSION: This study is suggestive of moderate diffuse encephalopathy, nonspecific etiology.  Periodic discharges with triphasic morphology can be on the ictal-interictal continuum but given the frequency and the morphology, these are more likely secondary to toxic-metabolic causes.  No seizures or definite epileptiform discharges were seen throughout the recording.  Priyanka Annabelle Harman    US Paracentesis  Result Date: 12-12-2022 INDICATION: Malignant ascites EXAM: ULTRASOUND GUIDED  PARACENTESIS MEDICATIONS: None. COMPLICATIONS: None immediate. PROCEDURE: Informed written consent was obtained from the patient after a discussion of the risks, benefits and alternatives to treatment. A timeout was performed prior to the initiation of the procedure. Initial ultrasound scanning demonstrates a small to moderate amount of ascites within the right lower abdominal quadrant. The right lower abdomen was prepped and draped in the usual sterile fashion. 1% lidocaine was used for local anesthesia. Following this, a 19 gauge, 7-cm, Yueh catheter was introduced. An ultrasound image was saved for documentation purposes. The paracentesis was performed. The catheter was removed and a dressing was applied. The patient tolerated the procedure well without immediate post procedural complication.  FINDINGS: A total of approximately 2.4 L of straw-colored fluid was removed. Samples were sent to the  laboratory as requested by the clinical team. IMPRESSION: Successful ultrasound-guided paracentesis yielding 2.4 liters of peritoneal fluid. Electronically Signed   By: Acquanetta Belling M.D.   On: 12/03/2022 11:02   CT ABDOMEN PELVIS WO CONTRAST  Result Date: 12/02/2022 CLINICAL DATA:  Stage III C left breast cancer with evidence of stage IV disease on CTA chest earlier today with upper abdominal peritoneal implants and progressive adenopathy. Bowel obstruction suspected with abdominal pain and distention. EXAM: CT ABDOMEN AND PELVIS WITHOUT CONTRAST TECHNIQUE: Multidetector CT imaging of the abdomen and pelvis was performed following the standard protocol without IV contrast. RADIATION DOSE REDUCTION: This exam was performed according to the departmental dose-optimization program which includes automated exposure control, adjustment of the mA and/or kV according to patient size and/or use of iterative reconstruction technique. COMPARISON:  CTA chest today at 9:33 a.m., PET-CT studies dated 08/26/2022 and 06/03/2022. FINDINGS: Lower chest: Again noted are bilateral symmetric minimal small layering pleural effusions with adjacent atelectasis. Lung bases are clear of infiltrates. Mild cardiomegaly. No pericardial effusion is seen. There is two-vessel trace calcific plaque in the LAD and right coronary arteries. Hepatobiliary: The liver is 18 cm length. No focal abnormality is seen without contrast. Gallbladder and bile ducts are unremarkable. Pancreas: Partially atrophic. Otherwise unremarkable without contrast. Spleen: There is homogeneous noncontrast attenuation. No masses seen without contrast. No splenomegaly. Adrenals/Urinary Tract: Slight chronic nodular thickening left adrenal gland. Unremarkable right adrenal gland and unenhanced bilateral kidneys. There is contrast in the collecting systems ureters. There is  contrast in the bladder. Bladder wall and lumen are unremarkable. Stomach/Bowel: There is a PEG tube adequately positioned with the balloon in the body of the stomach. Opacified and unopacified small bowel segments are normal caliber and the gastric wall is normal thickness. The normal appendix is well seen. The large intestine wall unremarkable with mild fecal stasis. Vascular/Lymphatic: Minimal scattered aortic calcific plaque with mild calcifications of the iliac arteries. No AAA. There is new demonstration of a nonspecific mildly enlarged gastrohepatic ligament lymph node measuring 1.2 cm short axis on 3:32. There is no other appreciable adenopathy but there have developed multiple peritoneal implants consistent with peritoneal metastatic disease, since 08/26/2022. There are several of these anterior to the liver. Largest of these is 3.2 x 1.8 cm on 3:19. Another is just anterior to the lateral segment of the left lobe of the liver measuring 1.7 x 1.4 cm on 3:34. Smaller peritoneal implants are present anterior to the distal stomach, with a larger lesion along the lesser curve of the stomach measuring 3.1 x 2.8 cm on 3:37. Additional smaller scattered omental/peritoneal implants are present in the anterior left mid to lower abdomen. Reproductive: Enlarged fibroid uterus again noted with multiple calcified and partially calcified fibroids. 3.3 cm left adnexal cyst is unchanged. No other adnexal abnormality. Pelvic phleboliths. Other: Moderate free ascites is present. Largest collection is left lower abdominopelvic quadrant. The ascites is probably malignant in etiology and measures between about 18 and 26 Hounsfield units. There is a VP shunt in the abdomen with the tip in the proximal left paracolic gutter. This was present previously. There is no free air or free hemorrhage, nor focal inflammatory process. Small umbilical fat hernia. Musculoskeletal: No regional bone metastasis is seen. There is degenerative  change of the lumbar spine with advanced facet arthrosis L3-4 and L4-5 with acquired spinal stenosis both levels. IMPRESSION: 1. No evidence of bowel obstruction or inflammatory change. 2. Moderate free ascites, probably malignant in etiology. 3. Multiple new  peritoneal implants consistent with peritoneal metastatic disease. 4. New mildly enlarged gastrohepatic ligament lymph node. 5. Enlarged fibroid uterus with stable 3.3 cm left adnexal cyst. 6. Constipation and diverticulosis. 7. Aortic and coronary artery atherosclerosis.  Mild cardiomegaly. 8. Minimal layering pleural effusions with adjacent atelectasis. 9. PEG tube and VP shunt. 10. Advanced facet arthrosis L3-4 and L4-5 with acquired spinal stenosis at both levels. Electronically Signed   By: Almira Bar M.D.   On: 12/02/2022 00:26   CT Angio Chest Pulmonary Embolism (PE) W or WO Contrast  Result Date: 12/01/2022 CLINICAL DATA:  Chest pain. Elevated D-dimer. History of breast cancer. Evaluate for pulmonary embolism. EXAM: CT ANGIOGRAPHY CHEST WITH CONTRAST TECHNIQUE: Multidetector CT imaging of the chest was performed using the standard protocol during bolus administration of intravenous contrast. Multiplanar CT image reconstructions and MIPs were obtained to evaluate the vascular anatomy. RADIATION DOSE REDUCTION: This exam was performed according to the departmental dose-optimization program which includes automated exposure control, adjustment of the mA and/or kV according to patient size and/or use of iterative reconstruction technique. CONTRAST:  75mL OMNIPAQUE IOHEXOL 350 MG/ML SOLN COMPARISON:  Chest CT-06/01/2021 PET-CT-06/11/2021 FINDINGS: Vascular Findings: There is adequate opacification of the pulmonary arterial system with the main pulmonary artery measuring 346 Hounsfield units. There are no discrete filling defects within the pulmonary arterial tree to suggest pulmonary embolism. Normal caliber of the main pulmonary artery. Cardiomegaly.   No pericardial effusion. No evidence of thoracic aortic aneurysm or dissection on this nongated examination. Bovine configuration of the aortic arch. The branch vessels of the aortic arch are tortuous though widely patent throughout their imaged courses. Review of the MIP images confirms the above findings. ---------------------------------------------------------------------------------- Nonvascular Findings: Mediastinum/Lymph Nodes: Progressive left axillary, supraclavicular and retro pectoral lymphadenopathy with index left axillary lymph node measuring 2 cm in greatest short axis diameter (image 30, series 4), previously, 1.2 cm when compared to PET-CT performed 06/11/2021. Index left supraclavicular lymph node now measures 1.4 cm and subpectoral lymph node measures 1.3 cm (image 19, series 4). Interval development of borderline enlarged mediastinal and internal mammary lymphadenopathy with index prevascular lymph node measuring 0.6 cm (image 27, series 4) and index right internal mamillary chain lymph node measuring 0.7 cm (image 38, series 4). No definitive hilar or right axillary lymphadenopathy. Lungs/Pleura: Tracheostomy tube tip terminates superior to the carina. There is a minimal amount of nonocclusive debris within the right mainstem bronchus. The central pulmonary airways appear widely patent. Interval development of trace bilateral pleural effusions. Evaluation of the pulmonary parenchyma is degraded secondary to patient respiratory artifact. Redemonstrated apical predominant centrilobular emphysematous change. Minimal dependent subpleural ground-glass consolidative atelectasis. No associated air bronchograms. No pneumothorax. Upper abdomen: Limited arterial phase evaluation of the upper abdomen demonstrates multiple peritoneal implants and masses with index mass within the right upper abdominal quadrant measuring 3.4 x 2.2 cm (image 71, series 4. This finding is associated with development of small  amount of intra-abdominal ascites. Musculoskeletal: No definite acute or aggressive osseous abnormalities. Regional soft tissues appear normal. IMPRESSION: 1. No acute cardiopulmonary disease. Specifically, no evidence of pulmonary embolism. 2. Findings compatible with progression of metastatic disease with worsening left axillary, retropectoral, supraclavicular, mediastinal and internal mammary lymphadenopathy and development of multiple peritoneal implants within the imaged right upper abdominal quadrant with small volume intra-abdominal ascites. 3. Interval development of trace bilateral pleural effusions. 4. Cardiomegaly. Electronically Signed   By: Simonne Come M.D.   On: 12/01/2022 10:37   DG Chest Portable 1 View  Result Date: 11/30/2022 CLINICAL DATA:  Cough.  History of breast cancer. EXAM: PORTABLE CHEST 1 VIEW COMPARISON:  None Available. FINDINGS: Film is rotated to the left. Tracheostomy tube in place. Presumed vertically oriented catheter for VP shunt along the right hemithorax. Normal cardiopericardial silhouette when adjusting for technique. No pneumothorax or edema. Question left retrocardiac opacity versus related to the rotation. Overlapping cardiac leads. IMPRESSION: Tracheostomy tube.  Presumed VP shunt. Rotated radiograph to the left with some left retrocardiac opacity. Subtle infiltrate or effusion is possible. No pneumothorax or edema. Electronically Signed   By: Karen Kays M.D.   On: 11/30/2022 12:23   DG Abd 2 Views  Result Date: 11/29/2022 CLINICAL DATA:  Abdominal distention. EXAM: ABDOMEN - 2 VIEW COMPARISON:  PET-CT dated 08/26/2022. FINDINGS: The bowel gas pattern is normal. There is no evidence of free air. A gastrostomy tube is noted. A catheter overlies the right chest, right abdomen and left abdomen, consistent with a ventriculoperitoneal catheter. A calcified fibroid is seen in the pelvis. Degenerative changes are seen in the spine. IMPRESSION: Nonobstructive bowel gas  pattern. Electronically Signed   By: Romona Curls M.D.   On: 11/29/2022 12:40    Catarina Hartshorn, DO  [facility]  If 7PM-7AM, please contact night-coverage [email or web address] Password [email or web address] 12/06/2022, 5:40 PM   LOS: 5 days

## 2022-12-07 DIAGNOSIS — Z930 Tracheostomy status: Secondary | ICD-10-CM | POA: Diagnosis not present

## 2022-12-07 DIAGNOSIS — J9621 Acute and chronic respiratory failure with hypoxia: Secondary | ICD-10-CM | POA: Diagnosis not present

## 2022-12-07 DIAGNOSIS — C50912 Malignant neoplasm of unspecified site of left female breast: Secondary | ICD-10-CM | POA: Diagnosis not present

## 2022-12-07 DIAGNOSIS — Z515 Encounter for palliative care: Secondary | ICD-10-CM | POA: Diagnosis not present

## 2022-12-07 DIAGNOSIS — J690 Pneumonitis due to inhalation of food and vomit: Secondary | ICD-10-CM

## 2022-12-07 DIAGNOSIS — I69891 Dysphagia following other cerebrovascular disease: Secondary | ICD-10-CM | POA: Diagnosis not present

## 2022-12-07 MED ORDER — IPRATROPIUM-ALBUTEROL 0.5-2.5 (3) MG/3ML IN SOLN
3.0000 mL | Freq: Three times a day (TID) | RESPIRATORY_TRACT | Status: DC
  Administered 2022-12-07 (×3): 3 mL via RESPIRATORY_TRACT
  Filled 2022-12-07 (×3): qty 3

## 2022-12-07 NOTE — Progress Notes (Signed)
Daily Progress Note   Patient Name: Stacy Moran       Date: 12/07/2022 DOB: 09-28-1955  Age: 67 y.o. MRN#: [id number] Attending Physician: Catarina Hartshorn, MD Primary Care Physician: Sherol Dade, DO Admit Date: 11/30/2022  Reason for Consultation/Follow-up: Establishing goals of care  Patient Profile/HPI:  67 y.o. 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 CT scans concerning for progression of cancer with peritoneal carcinamatosis. Per Oncology- not candidate for more cancer treatment.  Was being treated for aspiration pneumonitis. Continued to have ongoing aspiration during admission. Transitioned to comfort care by attending team.     Subjective: Chart reviewed including labs, progress notes, imaging from this and previous encounters.  On eval she appeared comfortable.  No family at bedside.    Physical Exam Vitals and nursing note reviewed.  Constitutional:      Appearance: She is ill-appearing.  Pulmonary:     Effort: Pulmonary effort is normal.     Comments: Trach  in place Neurological:     Mental Status: She is alert.             Vital Signs: BP 110/79 (BP Location: Left Arm)   Pulse 78   Temp (!) 100.4 F (38 C) (Axillary)   Resp 17   Ht 5\' 6"  (1.676 m)   Wt 72.5 kg   SpO2 93%   BMI 25.80 kg/m  SpO2: SpO2: 93 % O2 Device: O2 Device: Tracheostomy Collar O2 Flow Rate: O2 Flow Rate (L/min): 5 L/min  Intake/output summary:  Intake/Output Summary  (Last 24 hours) at 12/07/2022 1555 Last data filed at 12/07/2022 1341 Gross per 24 hour  Intake 150 ml  Output 300 ml  Net -150 ml   LBM: Last BM Date : 12/06/22 Baseline Weight: Weight: 72.5 kg Most recent weight: Weight: 72.5 kg       Palliative Assessment/Data: PPS: 10%      Patient Active Problem List   Diagnosis Date Noted   Goals of care, counseling/discussion 12/04/2022   Acute on chronic respiratory failure with hypoxia (HCC)  12/02/2022   Aspiration pneumonitis (HCC) 12/02/2022   Acute metabolic encephalopathy 12/02/2022   Chronic respiratory failure with hypoxia (HCC) 12/01/2022   Lobar pneumonia (HCC) 12/01/2022   Malnutrition of moderate degree 12/01/2022   Pneumonia 11/30/2022   Generalized weakness 11/30/2022   History of breast cancer 11/30/2022   Genetic testing 06/21/2022   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   Breast cancer metastasized to intrathoracic lymph node, left (HCC) 06/28/2021   Mass of upper outer quadrant of left breast    Altered mental status 06/01/2021   Cardiac arrest (HCC) 06/01/2021   Abnormal CT of the chest 06/01/2021   Bilateral vocal cord paralysis 01/14/2021   Fall (on) (from) other stairs and steps, initial encounter 08/26/2020   Subdural hematoma (HCC) 07/31/2020   History of hemorrhagic cerebrovascular accident (CVA) with residual deficit 06/06/2020   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   S/P ventriculoperitoneal shunt 06/06/2020   Functional urinary incontinence 06/06/2020   Ineffective airway clearance    Restlessness    Restlessness and agitation    Acute blood loss anemia    Essential hypertension    Seizure prophylaxis    Protein-calorie malnutrition, severe 04/24/2020   Dysphagia, post-stroke    Dysphagia    Status post tracheostomy (HCC)    S/P percutaneous endoscopic gastrostomy (PEG) tube placement (HCC)    ICH (intracerebral hemorrhage) (HCC) 04/22/2020   Obstructive hydrocephalus (HCC)     Seizure-like activity (HCC) 03/12/2020   PEG (percutaneous endoscopic gastrostomy) status (HCC) 03/08/2020   Hyperglycemia 03/08/2020   Acute respiratory failure (HCC)    Ventilator dependence (HCC)    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   Thank you for allowing the Palliative Medicine Team to assist in the care of this patient.   Greater than 50%  of this time was spent counseling and coordinating care related to the above assessment and plan.  Ocie Bob, AGNP-C Palliative Medicine   Please contact Palliative Medicine Team phone at [phone] for questions and concerns.

## 2022-12-07 NOTE — Plan of Care (Signed)

## 2022-12-07 NOTE — Plan of Care (Signed)

## 2022-12-07 NOTE — Discharge Summary (Signed)
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  At baseline she is reportedly ambulatory.  She had been experiencing generalized weakness for the past several days and there was a concern that she had a UTI.  There was no fever or chills abdominal pain or chest pain or shortness of breath.    She wears a depends at baseline.  Her G-tube was recently replaced.  All nutrition and medications are given through G-tube.  She was worked up in the ED and discharged back to [facility]  She returned to ED 11/30/2022 with fever of 101.2, persistent weakness shortness of breath emesis and nausea.  They reported that patient has had increasing tracheal secretions, wheezing and coughing.  She has not been able to ambulate which is not her baseline.  Her workup in the ED was suspicious for a pneumonia and she was started on ceftriaxone and azithromycin and admitted for further evaluation and treatment.  During the hospitalization, the patient did have nausea and vomiting initially secondary to increasing abdominal distention causing intrathoracic pressure.  She did have an aspirational event.  Her enteral feedings were discontinued temporarily.  Her oxygenation did worsen requiring increasing tracheal collar  supplementation up to 15 L.  She was continued on antibiotics.  Aggressive pulmonary toilet was undertaken.  Gradually, her respiratory status improved and her oxygen demand was decreased back to her usual baseline.  The patient underwent paracentesis on 12/03/2022 removing 2.4 L of ascites.  Her abdominal distention and pain improved.  The patient was started back on enteral feedings.  They were gradually increased up to 50 cc/h.  Unfortunately, she began having abdominal pain.  Her enteral feedings were subsequently put on hold temporarily and restarted at a lower rate.  Unfortunately, she could not tolerate reintroduction of enteral feedings despite lower rate of infusion.  Palliative medicine was consulted to assist with goals of care discussion.  Ultimately, the patient's daughter agreed that the patient's overall prognosis is poor and that we have " done everything we could do".  Medical oncology, Dr. Ellin Saba was consulted.  He did not feel the patient was a candidate for further active therapy.  He agreed with pursuing hospice. This was discussed with the patient's daughter.  Ultimately, the patient's focus of care was transitioned to comfort measures.  With assistance of TOC, residential hospice, [facility] was consulted.  After some initial hesitation on the part of the daughter, she ultimately agreed to transition the patient the patient to [facility] to continue further comfort measures care.  Assessment and Plan:   Acute respiratory on chronic failure with hypoxia  -due to aspiration pneumonitis -chronically on 4L trach collar blow by -now up to 10L>>8L>>5L -9/4 CTA chest--neg for PE;  progression of metastatic disease with worsening left axillary, retropectoral, supraclavicular, mediastinal and internal mammary lymphadenopathy -check VBG 7.4/52/41/32 -12/04/2022--overall respiratory status  stable/improving; she continues to require frequent suctioning secondary to frothy thin  secretions; oxygen saturations 96-98% on the patient is calm -now transitioned to comfort measures approach   Generalized weakness -Multifactorial including pneumonia, UTI, metastatic cancer, deconditioning -Continue antibiotics -PT evaluation when stable -UA>50 WBC -B12--1809 -Folic acid--10.0 -TSH--8.457, Free T4 0.86   Lobar pneumonia/Aspiration pneumonitis -Personally reviewed chest x-ray--left retrocardiac opacity -Continue ceftriaxone azithromycin--received 5 days -MRSA screen negative -PCT 0.11 -9/6 restart enteral feeding at lower rate -12/04/2022 enteral feedings increased to 40 cc/h with free water flushes   Peritoneal carcinomatosis -12/01/22 CT AP--moderate ascites; no bowel obstruction; new peritoneal implants consistent with peritoneal metastatic disease; new gastrohepatic ligament lymph node -12/03/22--paracentesis 2.4L removed -SAAG <1.1 --restarted enteral feeding>> patient having difficulty tolerating>> developing abdominal pain with escalating enteral feedings -Given discussions with the patient's daughter and patient and transition to comfort care approach>> decision was made to discontinue enteral feedings altogether at this point   UTI--Proteus -Continue ceftriaxone--finished 5 days -UA>50 WBC   Acute metabolic encephalopathy -multifactorial including infection, resp failure -more awake on 9/7, but continues to have intermittent episodes of agitation -unable to perform MRI brain due to aneurysm clips   -check ammonia--26 -B12--1809 -folate 10.0 -TSH 8.457, Free T4--0.80   Chest pain -personally reviewed EKG--sinus, nonspecific TWI -cycle troponin 3>>4 -CTA chest--neg PE   Left breast cancer-stage IIIb -Patient follows up with Dr. Ellin Saba - previously Faslodex monthly  -discussed with Dr. Ernestine Conrad a candidate  for active therapy>> recommends palliative care/hospice   Dysphagia -pt uses PEG for all nutrition and medication -restart enteral  feeding at lower rate -Free water flushes -still could not tolerated TF at lower rate -after discussion with daughter, leave pt off enteral feeding as pt has transitioned to comfort measures   Seizure disorder -resume keppra during hospitalization   Hypothyroidism -resume home levothyroxine during hospitalization   Essential Hypertension  -BP stable off meds   Mood Disorder -resume home medications   Hypermetabolism in the left central uterus:  -she has seen Dr. Despina Hidden -unable to perform EMBx due to fibroids -did not feel further eval with surgical intervention is warranted give she is not bleeding and is on aromatase inhibitor   Moderate malnutrition -restarted enteral feeding>> patient having difficulty tolerating>> developing abdominal pain with escalating enteral feedings -Given discussions with the patient's daughter and patient and transition to comfort care approach>> decision was made to discontinue enteral feedings altogether at this point   Goals of Care -multiple discussions with daughter -discussed again at bedside on 12/04/22--updated pt and daughter on all labs and radiographic studies showing progression of disease -discussed overall poor prognosis in the setting of patient's co-morbidities and poor functional baseline and progression of disease -12/04/22 discussed goals of care with the patient's daughter at bedside with the patient.  Patient and daughter both feel that everything that can be done medically has been done.  Dr. Marice Potter opinion has been added for no further active therapy.  Daughter and patient both agree to transition to residential hospice and comfort care. -agrees hospice eval -daughter prefers closer to [place]--daughter initially had some hesitation going to residential hospice but after further goals of care discussion, she agreed that this was the best situation for her mother.  TOC help to transition to residential hospice. -awaiting bed availability  at [facility]      Consultants: med onc, pallliative Procedures performed: none  Disposition: Residential Hospice--[facility] Diet recommendation:  Comfort feeding DISCHARGE MEDICATION: Allergies as of 12/07/2022   No Known  Allergies      Medication List     STOP taking these medications    amLODipine 5 MG tablet Commonly known as: NORVASC   CALCIUM-VITAMIN D PO   DULoxetine 20 MG capsule Commonly known as: CYMBALTA   feeding supplement (OSMOLITE 1.2 CAL) Liqd   fluticasone 50 MCG/ACT nasal spray Commonly known as: FLONASE   levETIRAcetam 100 MG/ML solution Commonly known as: KEPPRA   levothyroxine 100 MCG tablet Commonly known as: SYNTHROID       TAKE these medications    acetaminophen 500 MG tablet Commonly known as: TYLENOL Place 500 mg into feeding tube every 4 (four) hours as needed for mild pain or fever.   acetaminophen 325 MG tablet Commonly known as: TYLENOL Place 2 tablets (650 mg total) into feeding tube every 6 (six) hours as needed for mild pain.   ALPRAZolam 0.5 MG tablet Commonly known as: XANAX Take 0.5 mg by mouth 3 (three) times daily as needed.   chlorhexidine 0.12 % solution Commonly known as: PERIDEX 15 mLs by Mouth Rinse route 2 (two) times daily.   free water Soln Place 150 mLs into feeding tube every 6 (six) hours.   glycopyrrolate 1 MG tablet Commonly known as: ROBINUL Take 1 tablet (1 mg total) by mouth 2 (two) times daily. What changed: how to take this   guaiFENesin 100 MG/5ML liquid Commonly known as: ROBITUSSIN Place 10 mLs into feeding tube every 8 (eight) hours.   ipratropium-albuterol 0.5-2.5 (3) MG/3ML Soln Commonly known as: DUONEB Take 3 mLs by nebulization every 4 (four) hours as needed.   melatonin 3 MG Tabs tablet Take 3 mg by mouth at bedtime.   OXYGEN 4 L by Intratracheal route continuous.   polyethylene glycol 17 g packet Commonly known as: MIRALAX / GLYCOLAX Place 17 g into feeding tube  2 (two) times daily.   QUEtiapine 50 MG tablet Commonly known as: SEROQUEL Place 1 tablet (50 mg total) into feeding tube 2 (two) times daily.   scopolamine 1 MG/3DAYS Commonly known as: TRANSDERM-SCOP Place 1 patch onto the skin every 3 (three) days.   senna 8.6 MG Tabs tablet Commonly known as: SENOKOT Place 1 tablet (8.6 mg total) into feeding tube daily.        Discharge Exam: Filed Weights   11/30/22 1005  Weight: 72.5 kg   HEENT:  [place]/AT, No thrush, no icterus CV:  RRR, no rub, no S3, no S4 Lung:  bibasilar rales.  No wheeze Abd:  soft/+BS, NT Ext:  No edema, no lymphangitis, no synovitis, no rash   Condition at discharge: stable  The results of significant diagnostics from this hospitalization (including imaging, microbiology, ancillary and laboratory) are listed below for reference.   Imaging Studies: EEG adult  Result Date: 12/24/22 Charlsie Quest, MD     December 24, 2022  4:59 PM Patient Name: Stacy Moran MRN: [id number] Epilepsy Attending: Charlsie Quest Referring Physician/Provider: Catarina Hartshorn, MD Date: December 24, 2022 Duration: 22.50 mins Patient history: 67yo F with ams getting eeg to evaluate for seizure Level of alertness: Awake AEDs during EEG study: Xanax Technical aspects: This EEG study was done with scalp electrodes positioned according to the 10-20 International system of electrode placement. Electrical activity was reviewed with band pass filter of 1-70Hz , sensitivity of 7 uV/mm, display speed of 80mm/sec with a 60Hz  notched filter applied as appropriate. EEG data were recorded continuously and digitally stored.  Video monitoring was available and reviewed as appropriate. Description: No posterior dominant rhythm was seen.  EEG showed continuous generalized 3 to 6 Hz theta-delta slowing. Periodic discharges with triphasic morphology were noted at 0.5-1 hz intermittently. Hyperventilation and photic stimulation were not performed.    ABNORMALITY - Continuous slow,  generalized - Periodic discharges with triphasic morphology, generalized  IMPRESSION: This study is suggestive of moderate diffuse encephalopathy, nonspecific etiology.  Periodic discharges with triphasic morphology can be on the ictal-interictal continuum but given the frequency and the morphology, these are more likely secondary to toxic-metabolic causes.  No seizures or definite epileptiform discharges were seen throughout the recording.  Priyanka Annabelle Harman    US Paracentesis  Result Date: 12/03/2022 INDICATION: Malignant ascites EXAM: ULTRASOUND GUIDED  PARACENTESIS MEDICATIONS: None. COMPLICATIONS: None immediate. PROCEDURE: Informed written consent was obtained from the patient after a discussion of the risks, benefits and alternatives to treatment. A timeout was performed prior to the initiation of the procedure. Initial ultrasound scanning demonstrates a small to moderate amount of ascites within the right lower abdominal quadrant. The right lower abdomen was prepped and draped in the usual sterile fashion. 1% lidocaine was used for local anesthesia. Following this, a 19 gauge, 7-cm, Yueh catheter was introduced. An ultrasound image was saved for documentation purposes. The paracentesis was performed. The catheter was removed and a dressing was applied. The patient tolerated the procedure well without immediate post procedural complication. FINDINGS: A total of approximately 2.4 L of straw-colored fluid was removed. Samples were sent to the laboratory as requested by the clinical team. IMPRESSION: Successful ultrasound-guided paracentesis yielding 2.4 liters of peritoneal fluid. Electronically Signed   By: Acquanetta Belling M.D.   On: 12/03/2022 11:02   CT ABDOMEN PELVIS WO CONTRAST  Result Date: 12/02/2022 CLINICAL DATA:  Stage III C left breast cancer with evidence of stage IV disease on CTA chest earlier today with upper abdominal peritoneal implants and progressive adenopathy. Bowel obstruction suspected  with abdominal pain and distention. EXAM: CT ABDOMEN AND PELVIS WITHOUT CONTRAST TECHNIQUE: Multidetector CT imaging of the abdomen and pelvis was performed following the standard protocol without IV contrast. RADIATION DOSE REDUCTION: This exam was performed according to the departmental dose-optimization program which includes automated exposure control, adjustment of the mA and/or kV according to patient size and/or use of iterative reconstruction technique. COMPARISON:  CTA chest today at 9:33 a.m., PET-CT studies dated 08/26/2022 and 06/03/2022. FINDINGS: Lower chest: Again noted are bilateral symmetric minimal small layering pleural effusions with adjacent atelectasis. Lung bases are clear of infiltrates. Mild cardiomegaly. No pericardial effusion is seen. There is two-vessel trace calcific plaque in the LAD and right coronary arteries. Hepatobiliary: The liver is 18 cm length. No focal abnormality is seen without contrast. Gallbladder and bile ducts are unremarkable. Pancreas: Partially atrophic. Otherwise unremarkable without contrast. Spleen: There is homogeneous noncontrast attenuation. No masses seen without contrast. No splenomegaly. Adrenals/Urinary Tract: Slight chronic nodular thickening left adrenal gland. Unremarkable right adrenal gland and unenhanced bilateral kidneys. There is contrast in the collecting systems ureters. There is contrast in the bladder. Bladder wall and lumen are unremarkable. Stomach/Bowel: There is a PEG tube adequately positioned with the balloon in the body of the stomach. Opacified and unopacified small bowel segments are normal caliber and the gastric wall is normal thickness. The normal appendix is well seen. The large intestine wall unremarkable with mild fecal stasis. Vascular/Lymphatic: Minimal scattered aortic calcific plaque with mild calcifications of the iliac arteries. No AAA. There is new demonstration of a nonspecific mildly enlarged gastrohepatic ligament lymph  node measuring 1.2 cm short  axis on 3:32. There is no other appreciable adenopathy but there have developed multiple peritoneal implants consistent with peritoneal metastatic disease, since 08/26/2022. There are several of these anterior to the liver. Largest of these is 3.2 x 1.8 cm on 3:19. Another is just anterior to the lateral segment of the left lobe of the liver measuring 1.7 x 1.4 cm on 3:34. Smaller peritoneal implants are present anterior to the distal stomach, with a larger lesion along the lesser curve of the stomach measuring 3.1 x 2.8 cm on 3:37. Additional smaller scattered omental/peritoneal implants are present in the anterior left mid to lower abdomen. Reproductive: Enlarged fibroid uterus again noted with multiple calcified and partially calcified fibroids. 3.3 cm left adnexal cyst is unchanged. No other adnexal abnormality. Pelvic phleboliths. Other: Moderate free ascites is present. Largest collection is left lower abdominopelvic quadrant. The ascites is probably malignant in etiology and measures between about 18 and 26 Hounsfield units. There is a VP shunt in the abdomen with the tip in the proximal left paracolic gutter. This was present previously. There is no free air or free hemorrhage, nor focal inflammatory process. Small umbilical fat hernia. Musculoskeletal: No regional bone metastasis is seen. There is degenerative change of the lumbar spine with advanced facet arthrosis L3-4 and L4-5 with acquired spinal stenosis both levels. IMPRESSION: 1. No evidence of bowel obstruction or inflammatory change. 2. Moderate free ascites, probably malignant in etiology. 3. Multiple new peritoneal implants consistent with peritoneal metastatic disease. 4. New mildly enlarged gastrohepatic ligament lymph node. 5. Enlarged fibroid uterus with stable 3.3 cm left adnexal cyst. 6. Constipation and diverticulosis. 7. Aortic and coronary artery atherosclerosis.  Mild cardiomegaly. 8. Minimal layering pleural  effusions with adjacent atelectasis. 9. PEG tube and VP shunt. 10. Advanced facet arthrosis L3-4 and L4-5 with acquired spinal stenosis at both levels. Electronically Signed   By: Almira Bar M.D.   On: 12/02/2022 00:26   CT Angio Chest Pulmonary Embolism (PE) W or WO Contrast  Result Date: 12/01/2022 CLINICAL DATA:  Chest pain. Elevated D-dimer. History of breast cancer. Evaluate for pulmonary embolism. EXAM: CT ANGIOGRAPHY CHEST WITH CONTRAST TECHNIQUE: Multidetector CT imaging of the chest was performed using the standard protocol during bolus administration of intravenous contrast. Multiplanar CT image reconstructions and MIPs were obtained to evaluate the vascular anatomy. RADIATION DOSE REDUCTION: This exam was performed according to the departmental dose-optimization program which includes automated exposure control, adjustment of the mA and/or kV according to patient size and/or use of iterative reconstruction technique. CONTRAST:  75mL OMNIPAQUE IOHEXOL 350 MG/ML SOLN COMPARISON:  Chest CT-06/01/2021 PET-CT-06/11/2021 FINDINGS: Vascular Findings: There is adequate opacification of the pulmonary arterial system with the main pulmonary artery measuring 346 Hounsfield units. There are no discrete filling defects within the pulmonary arterial tree to suggest pulmonary embolism. Normal caliber of the main pulmonary artery. Cardiomegaly.  No pericardial effusion. No evidence of thoracic aortic aneurysm or dissection on this nongated examination. Bovine configuration of the aortic arch. The branch vessels of the aortic arch are tortuous though widely patent throughout their imaged courses. Review of the MIP images confirms the above findings. ---------------------------------------------------------------------------------- Nonvascular Findings: Mediastinum/Lymph Nodes: Progressive left axillary, supraclavicular and retro pectoral lymphadenopathy with index left axillary lymph node measuring 2 cm in greatest  short axis diameter (image 30, series 4), previously, 1.2 cm when compared to PET-CT performed 06/11/2021. Index left supraclavicular lymph node now measures 1.4 cm and subpectoral lymph node measures 1.3 cm (image 19, series 4). Interval development  of borderline enlarged mediastinal and internal mammary lymphadenopathy with index prevascular lymph node measuring 0.6 cm (image 27, series 4) and index right internal mamillary chain lymph node measuring 0.7 cm (image 38, series 4). No definitive hilar or right axillary lymphadenopathy. Lungs/Pleura: Tracheostomy tube tip terminates superior to the carina. There is a minimal amount of nonocclusive debris within the right mainstem bronchus. The central pulmonary airways appear widely patent. Interval development of trace bilateral pleural effusions. Evaluation of the pulmonary parenchyma is degraded secondary to patient respiratory artifact. Redemonstrated apical predominant centrilobular emphysematous change. Minimal dependent subpleural ground-glass consolidative atelectasis. No associated air bronchograms. No pneumothorax. Upper abdomen: Limited arterial phase evaluation of the upper abdomen demonstrates multiple peritoneal implants and masses with index mass within the right upper abdominal quadrant measuring 3.4 x 2.2 cm (image 71, series 4. This finding is associated with development of small amount of intra-abdominal ascites. Musculoskeletal: No definite acute or aggressive osseous abnormalities. Regional soft tissues appear normal. IMPRESSION: 1. No acute cardiopulmonary disease. Specifically, no evidence of pulmonary embolism. 2. Findings compatible with progression of metastatic disease with worsening left axillary, retropectoral, supraclavicular, mediastinal and internal mammary lymphadenopathy and development of multiple peritoneal implants within the imaged right upper abdominal quadrant with small volume intra-abdominal ascites. 3. Interval development of  trace bilateral pleural effusions. 4. Cardiomegaly. Electronically Signed   By: Simonne Come M.D.   On: 12/01/2022 10:37   DG Chest Portable 1 View  Result Date: 11/30/2022 CLINICAL DATA:  Cough.  History of breast cancer. EXAM: PORTABLE CHEST 1 VIEW COMPARISON:  None Available. FINDINGS: Film is rotated to the left. Tracheostomy tube in place. Presumed vertically oriented catheter for VP shunt along the right hemithorax. Normal cardiopericardial silhouette when adjusting for technique. No pneumothorax or edema. Question left retrocardiac opacity versus related to the rotation. Overlapping cardiac leads. IMPRESSION: Tracheostomy tube.  Presumed VP shunt. Rotated radiograph to the left with some left retrocardiac opacity. Subtle infiltrate or effusion is possible. No pneumothorax or edema. Electronically Signed   By: Karen Kays M.D.   On: 11/30/2022 12:23   DG Abd 2 Views  Result Date: 11/29/2022 CLINICAL DATA:  Abdominal distention. EXAM: ABDOMEN - 2 VIEW COMPARISON:  PET-CT dated 08/26/2022. FINDINGS: The bowel gas pattern is normal. There is no evidence of free air. A gastrostomy tube is noted. A catheter overlies the right chest, right abdomen and left abdomen, consistent with a ventriculoperitoneal catheter. A calcified fibroid is seen in the pelvis. Degenerative changes are seen in the spine. IMPRESSION: Nonobstructive bowel gas pattern. Electronically Signed   By: Romona Curls M.D.   On: 11/29/2022 12:40    Microbiology: Results for orders placed or performed during the hospital encounter of 11/30/22  Culture, blood (routine x 2)     Status: None   Collection Time: 11/30/22 10:00 AM   Specimen: BLOOD  Result Value Ref Range Status   Specimen Description BLOOD RIGHT ASSIST CONTROL  Final   Special Requests   Final    BOTTLES DRAWN AEROBIC AND ANAEROBIC Blood Culture results may not be optimal due to an excessive volume of blood received in culture bottles   Culture   Final    NO GROWTH 5  DAYS Performed at [facility], [address] [place], [place] [address]    Report Status 12/05/2022 FINAL  Final  SARS Coronavirus 2 by RT PCR (hospital order, performed in [facility] hospital lab) *cepheid single result test* Anterior Nasal Swab     Status: None  Collection Time: 11/30/22 10:10 AM   Specimen: Anterior Nasal Swab  Result Value Ref Range Status   SARS Coronavirus 2 by RT PCR NEGATIVE NEGATIVE Final    Comment: (NOTE) SARS-CoV-2 target nucleic acids are NOT DETECTED.  The SARS-CoV-2 RNA is generally detectable in upper and lower respiratory specimens during the acute phase of infection. The lowest concentration of SARS-CoV-2 viral copies this assay can detect is 250 copies / mL. A negative result does not preclude SARS-CoV-2 infection and should not be used as the sole basis for treatment or other patient management decisions.  A negative result may occur with improper specimen collection / handling, submission of specimen other than nasopharyngeal swab, presence of viral mutation(s) within the areas targeted by this assay, and inadequate number of viral copies (<250 copies / mL). A negative result must be combined with clinical observations, patient history, and epidemiological information.  Fact Sheet for Patients:   RoadLapTop.co.za  Fact Sheet for Healthcare Providers: http://kim-miller.com/  This test is not yet approved or  cleared by the [place] FDA and has been authorized for detection and/or diagnosis of SARS-CoV-2 by FDA under an Emergency Use Authorization (EUA).  This EUA will remain in effect (meaning this test can be used) for the duration of the COVID-19 declaration under Section 564(b)(1) of the Act, 21 U.S.C. section 360bbb-3(b)(1), unless the authorization is terminated or revoked sooner.  Performed at [facility], [address], [place], [place] [address]   Urine Culture     Status:  Abnormal   Collection Time: 11/30/22 10:30 AM   Specimen: Urine, Random  Result Value Ref Range Status   Specimen Description   Final    URINE, RANDOM Performed at [facility], [address] [place], [place] [address]    Special Requests   Final    NONE Reflexed from [id number] Performed at [facility], [address], [place], [place] [address]    Culture (A)  Final    >=100,000 COLONIES/mL PROTEUS MIRABILIS WITHIN MIXED ORGANISMS Performed at [facility], [address] [place], [place] [address]    Report Status 12/02/2022 FINAL  Final   Organism ID, Bacteria PROTEUS MIRABILIS (A)  Final      Susceptibility   Proteus mirabilis - MIC*    AMPICILLIN <=2 SENSITIVE Sensitive     CEFAZOLIN <=4 SENSITIVE Sensitive     CEFEPIME <=0.12 SENSITIVE Sensitive     CEFTRIAXONE <=0.25 SENSITIVE Sensitive     CIPROFLOXACIN <=0.25 SENSITIVE Sensitive     GENTAMICIN <=1 SENSITIVE Sensitive     IMIPENEM 2 SENSITIVE Sensitive     NITROFURANTOIN 128 RESISTANT Resistant     TRIMETH/SULFA <=20 SENSITIVE Sensitive     AMPICILLIN/SULBACTAM <=2 SENSITIVE Sensitive     PIP/TAZO <=4 SENSITIVE Sensitive     * >=100,000 COLONIES/mL PROTEUS MIRABILIS  Culture, blood (routine x 2)     Status: None   Collection Time: 11/30/22 10:57 AM   Specimen: BLOOD  Result Value Ref Range Status   Specimen Description BLOOD BLOOD LEFT HAND  Final   Special Requests   Final    BOTTLES DRAWN AEROBIC AND ANAEROBIC Blood Culture adequate volume   Culture   Final    NO GROWTH 5 DAYS Performed at [facility], [address] [place], [place] [address]    Report Status 12/05/2022 FINAL  Final  MRSA Next Gen by PCR, Nasal     Status: None   Collection Time: 11/30/22  5:45 PM  Specimen: Nasal Mucosa; Nasal Swab  Result Value Ref Range Status   MRSA by PCR Next Gen NOT DETECTED NOT DETECTED Final    Comment: (NOTE) The GeneXpert MRSA Assay (FDA approved for NASAL specimens only), is one component of a  comprehensive MRSA colonization surveillance program. It is not intended to diagnose MRSA infection nor to guide or monitor treatment for MRSA infections. Test performance is not FDA approved in patients less than 98 years old. Performed at [facility], [address] [place], [place] [address]   Gram stain     Status: None   Collection Time: 12/03/22  9:43 AM   Specimen: Peritoneal Washings  Result Value Ref Range Status   Specimen Description PERITONEAL  Final   Special Requests NONE  Final   Gram Stain   Final    CYTOSPIN SMEAR WBC PRESENT,BOTH PMN AND MONONUCLEAR NO ORGANISMS SEEN Performed at [facility], [address] [place], [place] [address]    Report Status 12/03/2022 FINAL  Final  Culture, body fluid w Gram Stain-bottle     Status: None (Preliminary result)   Collection Time: 12/03/22  9:43 AM   Specimen: Peritoneal Washings  Result Value Ref Range Status   Specimen Description PERITONEAL  Final   Special Requests 10CC  Final   Culture   Final    NO GROWTH 3 DAYS Performed at [facility], [address] [place], [place] [address]    Report Status PENDING  Incomplete    Labs: CBC: Recent Labs  Lab 12/01/22 0446 12/02/22 0441 12/03/22 0441 12/04/22 0429  WBC 9.5 8.1 7.1 6.1  HGB 12.0 12.7 12.3 11.2*  HCT 38.7 42.4 41.7 36.9  MCV 80.8 82.8 83.7 81.5  PLT 197 183 175 240   Basic Metabolic Panel: Recent Labs  Lab 12/01/22 0446 12/02/22 0441 12/03/22 0441 12/04/22 0429  NA 139 141 140 137  K 3.7 4.1 4.4 4.3  CL 99 103 103 102  CO2 30 27 26 27   GLUCOSE 169* 111* 77 169*  BUN 15 7* 11 10  CREATININE 0.63 0.55 0.64 0.61  CALCIUM 8.7* 8.6* 8.3* 7.8*  MG  --  2.5*  --  2.4  PHOS  --   --   --  2.6   Liver Function Tests: Recent Labs  Lab 12/03/22 0441  AST 30  ALT 18  ALKPHOS 63  BILITOT 0.4  PROT 6.5  ALBUMIN 2.8*   CBG: No results for input(s): "GLUCAP" in the last 168 hours.  Discharge time spent: greater than 30  minutes.  Signed: Catarina Hartshorn, MD [facility] 12/07/2022

## 2022-12-07 NOTE — Progress Notes (Signed)
Called report to [employer] and s/w Forde Radon, RN.

## 2022-12-08 ENCOUNTER — Encounter: Payer: Self-pay | Admitting: Hematology

## 2022-12-08 LAB — CULTURE, BODY FLUID W GRAM STAIN -BOTTLE: Culture: NO GROWTH

## 2022-12-08 LAB — CYTOLOGY - NON PAP

## 2022-12-24 ENCOUNTER — Inpatient Hospital Stay: Payer: Medicaid Other | Admitting: Oncology

## 2022-12-24 ENCOUNTER — Inpatient Hospital Stay: Payer: Medicaid Other

## 2022-12-28 DEATH — deceased

## 2023-11-07 ENCOUNTER — Other Ambulatory Visit: Payer: Self-pay

## 2023-11-30 ENCOUNTER — Other Ambulatory Visit: Payer: Self-pay | Admitting: *Deleted
